# Patient Record
Sex: Female | Born: 1955 | Race: White | Hispanic: No | Smoking: Former smoker
Health system: Southern US, Community
[De-identification: ages and names within clinical notes are randomized; demographics above are authoritative.]

## PROBLEM LIST (undated history)

## (undated) DIAGNOSIS — Z87410 Personal history of cervical dysplasia: Secondary | ICD-10-CM

## (undated) DIAGNOSIS — K317 Polyp of stomach and duodenum: Secondary | ICD-10-CM

## (undated) DIAGNOSIS — K648 Other hemorrhoids: Secondary | ICD-10-CM

## (undated) DIAGNOSIS — R55 Syncope and collapse: Secondary | ICD-10-CM

## (undated) DIAGNOSIS — K047 Periapical abscess without sinus: Secondary | ICD-10-CM

## (undated) DIAGNOSIS — F32A Depression, unspecified: Secondary | ICD-10-CM

## (undated) DIAGNOSIS — N183 Chronic kidney disease, stage 3 unspecified: Secondary | ICD-10-CM

## (undated) DIAGNOSIS — R39198 Other difficulties with micturition: Secondary | ICD-10-CM

## (undated) DIAGNOSIS — F419 Anxiety disorder, unspecified: Secondary | ICD-10-CM

## (undated) DIAGNOSIS — K644 Residual hemorrhoidal skin tags: Secondary | ICD-10-CM

## (undated) DIAGNOSIS — M1990 Unspecified osteoarthritis, unspecified site: Secondary | ICD-10-CM

## (undated) DIAGNOSIS — E785 Hyperlipidemia, unspecified: Secondary | ICD-10-CM

## (undated) DIAGNOSIS — R531 Weakness: Secondary | ICD-10-CM

## (undated) DIAGNOSIS — K624 Stenosis of anus and rectum: Secondary | ICD-10-CM

## (undated) DIAGNOSIS — D219 Benign neoplasm of connective and other soft tissue, unspecified: Secondary | ICD-10-CM

## (undated) DIAGNOSIS — M654 Radial styloid tenosynovitis [de Quervain]: Secondary | ICD-10-CM

## (undated) DIAGNOSIS — R319 Hematuria, unspecified: Secondary | ICD-10-CM

## (undated) DIAGNOSIS — K50118 Crohn's disease of large intestine with other complication: Secondary | ICD-10-CM

## (undated) DIAGNOSIS — F329 Major depressive disorder, single episode, unspecified: Secondary | ICD-10-CM

## (undated) DIAGNOSIS — R5383 Other fatigue: Secondary | ICD-10-CM

## (undated) DIAGNOSIS — E876 Hypokalemia: Secondary | ICD-10-CM

## (undated) DIAGNOSIS — R233 Spontaneous ecchymoses: Secondary | ICD-10-CM

## (undated) DIAGNOSIS — K625 Hemorrhage of anus and rectum: Secondary | ICD-10-CM

## (undated) DIAGNOSIS — A6000 Herpesviral infection of urogenital system, unspecified: Secondary | ICD-10-CM

## (undated) DIAGNOSIS — K921 Melena: Secondary | ICD-10-CM

## (undated) DIAGNOSIS — Z5189 Encounter for other specified aftercare: Secondary | ICD-10-CM

## (undated) DIAGNOSIS — K219 Gastro-esophageal reflux disease without esophagitis: Secondary | ICD-10-CM

## (undated) DIAGNOSIS — K6289 Other specified diseases of anus and rectum: Secondary | ICD-10-CM

## (undated) DIAGNOSIS — Z9189 Other specified personal risk factors, not elsewhere classified: Secondary | ICD-10-CM

## (undated) DIAGNOSIS — T7840XA Allergy, unspecified, initial encounter: Secondary | ICD-10-CM

## (undated) DIAGNOSIS — G2581 Restless legs syndrome: Secondary | ICD-10-CM

## (undated) DIAGNOSIS — N83209 Unspecified ovarian cyst, unspecified side: Secondary | ICD-10-CM

## (undated) DIAGNOSIS — R238 Other skin changes: Secondary | ICD-10-CM

## (undated) DIAGNOSIS — R911 Solitary pulmonary nodule: Secondary | ICD-10-CM

## (undated) DIAGNOSIS — H269 Unspecified cataract: Secondary | ICD-10-CM

## (undated) DIAGNOSIS — Z8719 Personal history of other diseases of the digestive system: Secondary | ICD-10-CM

## (undated) DIAGNOSIS — D649 Anemia, unspecified: Secondary | ICD-10-CM

## (undated) DIAGNOSIS — Z972 Presence of dental prosthetic device (complete) (partial): Secondary | ICD-10-CM

## (undated) DIAGNOSIS — K529 Noninfective gastroenteritis and colitis, unspecified: Secondary | ICD-10-CM

## (undated) DIAGNOSIS — I739 Peripheral vascular disease, unspecified: Secondary | ICD-10-CM

## (undated) DIAGNOSIS — N289 Disorder of kidney and ureter, unspecified: Secondary | ICD-10-CM

## (undated) DIAGNOSIS — H539 Unspecified visual disturbance: Secondary | ICD-10-CM

## (undated) DIAGNOSIS — E669 Obesity, unspecified: Secondary | ICD-10-CM

## (undated) DIAGNOSIS — I10 Essential (primary) hypertension: Secondary | ICD-10-CM

## (undated) HISTORY — DX: Syncope and collapse: R55

## (undated) HISTORY — PX: RESTORATIVE PROCTOCOLECTOMY: SHX2335

## (undated) HISTORY — DX: Depression, unspecified: F32.A

## (undated) HISTORY — DX: Benign neoplasm of connective and other soft tissue, unspecified: D21.9

## (undated) HISTORY — DX: Weakness: R53.1

## (undated) HISTORY — DX: Spontaneous ecchymoses: R23.3

## (undated) HISTORY — DX: Radial styloid tenosynovitis (de quervain): M65.4

## (undated) HISTORY — PX: COLONOSCOPY: SHX174

## (undated) HISTORY — DX: Major depressive disorder, single episode, unspecified: F32.9

## (undated) HISTORY — PX: TUBAL LIGATION: SHX77

## (undated) HISTORY — DX: Anemia, unspecified: D64.9

## (undated) HISTORY — PX: SIGMOIDOSCOPY: SUR1295

## (undated) HISTORY — DX: Crohn's disease of large intestine with other complication: K50.118

## (undated) HISTORY — DX: Allergy, unspecified, initial encounter: T78.40XA

## (undated) HISTORY — PX: UPPER GASTROINTESTINAL ENDOSCOPY: SHX188

## (undated) HISTORY — DX: Encounter for other specified aftercare: Z51.89

## (undated) HISTORY — PX: CHOLECYSTECTOMY: SHX55

## (undated) HISTORY — DX: Other hemorrhoids: K64.8

## (undated) HISTORY — PX: HEMORRHOID SURGERY: SHX153

## (undated) HISTORY — DX: Gastro-esophageal reflux disease without esophagitis: K21.9

## (undated) HISTORY — PX: ANAL DILATION: SHX1137

## (undated) HISTORY — DX: Melena: K92.1

## (undated) HISTORY — DX: Essential (primary) hypertension: I10

## (undated) HISTORY — PX: CERVICAL BIOPSY  W/ LOOP ELECTRODE EXCISION: SUR135

## (undated) HISTORY — DX: Stenosis of anus and rectum: K62.4

## (undated) HISTORY — DX: Unspecified cataract: H26.9

## (undated) HISTORY — DX: Polyp of stomach and duodenum: K31.7

## (undated) HISTORY — DX: Other fatigue: R53.83

## (undated) HISTORY — DX: Solitary pulmonary nodule: R91.1

## (undated) HISTORY — DX: Presence of dental prosthetic device (complete) (partial): Z97.2

## (undated) HISTORY — DX: Unspecified visual disturbance: H53.9

## (undated) HISTORY — DX: Other difficulties with micturition: R39.198

## (undated) HISTORY — DX: Unspecified osteoarthritis, unspecified site: M19.90

## (undated) HISTORY — PX: OTHER SURGICAL HISTORY: SHX169

## (undated) HISTORY — DX: Noninfective gastroenteritis and colitis, unspecified: K52.9

## (undated) HISTORY — DX: Anxiety disorder, unspecified: F41.9

## (undated) HISTORY — DX: Personal history of other diseases of the digestive system: Z87.19

## (undated) HISTORY — DX: Hypokalemia: E87.6

## (undated) HISTORY — DX: Residual hemorrhoidal skin tags: K64.4

## (undated) HISTORY — DX: Chronic kidney disease, stage 3 unspecified: N18.30

## (undated) HISTORY — DX: Other specified personal risk factors, not elsewhere classified: Z91.89

## (undated) HISTORY — DX: Personal history of cervical dysplasia: Z87.410

## (undated) HISTORY — DX: Unspecified ovarian cyst, unspecified side: N83.209

## (undated) HISTORY — DX: Hematuria, unspecified: R31.9

## (undated) HISTORY — PX: ILEOSTOMY CLOSURE: SHX1784

## (undated) HISTORY — DX: Peripheral vascular disease, unspecified: I73.9

## (undated) HISTORY — DX: Disorder of kidney and ureter, unspecified: N28.9

## (undated) HISTORY — DX: Hemorrhage of anus and rectum: K62.5

## (undated) HISTORY — DX: Obesity, unspecified: E66.9

## (undated) HISTORY — DX: Other specified diseases of anus and rectum: K62.89

## (undated) HISTORY — DX: Hyperlipidemia, unspecified: E78.5

## (undated) HISTORY — DX: Other skin changes: R23.8

## (undated) HISTORY — DX: Restless legs syndrome: G25.81

---

## 1996-09-04 ENCOUNTER — Encounter: Payer: Self-pay | Admitting: Internal Medicine

## 1996-09-06 HISTORY — PX: TOTAL ABDOMINAL HYSTERECTOMY: SHX209

## 1999-03-25 ENCOUNTER — Other Ambulatory Visit: Admission: RE | Admit: 1999-03-25 | Discharge: 1999-03-25 | Payer: Self-pay | Admitting: Obstetrics and Gynecology

## 2000-04-05 ENCOUNTER — Other Ambulatory Visit: Admission: RE | Admit: 2000-04-05 | Discharge: 2000-04-05 | Payer: Self-pay | Admitting: Obstetrics and Gynecology

## 2001-06-16 ENCOUNTER — Other Ambulatory Visit: Admission: RE | Admit: 2001-06-16 | Discharge: 2001-06-16 | Payer: Self-pay | Admitting: Obstetrics and Gynecology

## 2001-10-27 ENCOUNTER — Ambulatory Visit (HOSPITAL_BASED_OUTPATIENT_CLINIC_OR_DEPARTMENT_OTHER): Admission: RE | Admit: 2001-10-27 | Discharge: 2001-10-27 | Payer: Self-pay | Admitting: General Surgery

## 2001-10-27 ENCOUNTER — Encounter (INDEPENDENT_AMBULATORY_CARE_PROVIDER_SITE_OTHER): Payer: Self-pay | Admitting: Specialist

## 2001-11-15 ENCOUNTER — Encounter (HOSPITAL_COMMUNITY): Admission: RE | Admit: 2001-11-15 | Discharge: 2002-02-13 | Payer: Self-pay | Admitting: Internal Medicine

## 2001-11-15 ENCOUNTER — Encounter (INDEPENDENT_AMBULATORY_CARE_PROVIDER_SITE_OTHER): Payer: Self-pay | Admitting: Specialist

## 2001-11-15 ENCOUNTER — Encounter: Payer: Self-pay | Admitting: Internal Medicine

## 2001-11-16 DIAGNOSIS — K2100 Gastro-esophageal reflux disease with esophagitis, without bleeding: Secondary | ICD-10-CM | POA: Insufficient documentation

## 2001-11-16 DIAGNOSIS — D131 Benign neoplasm of stomach: Secondary | ICD-10-CM | POA: Insufficient documentation

## 2001-11-16 DIAGNOSIS — K209 Esophagitis, unspecified: Secondary | ICD-10-CM

## 2002-10-05 ENCOUNTER — Inpatient Hospital Stay (HOSPITAL_COMMUNITY): Admission: EM | Admit: 2002-10-05 | Discharge: 2002-10-06 | Payer: Self-pay

## 2002-10-12 ENCOUNTER — Other Ambulatory Visit: Admission: RE | Admit: 2002-10-12 | Discharge: 2002-10-12 | Payer: Self-pay | Admitting: Obstetrics and Gynecology

## 2002-11-16 ENCOUNTER — Encounter: Payer: Self-pay | Admitting: Internal Medicine

## 2003-01-18 ENCOUNTER — Encounter: Payer: Self-pay | Admitting: Internal Medicine

## 2003-01-18 ENCOUNTER — Ambulatory Visit (HOSPITAL_COMMUNITY): Admission: RE | Admit: 2003-01-18 | Discharge: 2003-01-18 | Payer: Self-pay | Admitting: Internal Medicine

## 2003-01-23 ENCOUNTER — Ambulatory Visit (HOSPITAL_COMMUNITY): Admission: RE | Admit: 2003-01-23 | Discharge: 2003-01-23 | Payer: Self-pay | Admitting: Internal Medicine

## 2003-01-23 ENCOUNTER — Encounter: Payer: Self-pay | Admitting: Internal Medicine

## 2003-04-05 ENCOUNTER — Encounter: Admission: RE | Admit: 2003-04-05 | Discharge: 2003-04-05 | Payer: Self-pay | Admitting: Family Medicine

## 2003-04-05 ENCOUNTER — Encounter: Payer: Self-pay | Admitting: Family Medicine

## 2003-04-14 ENCOUNTER — Encounter: Admission: RE | Admit: 2003-04-14 | Discharge: 2003-04-14 | Payer: Self-pay | Admitting: Family Medicine

## 2003-04-14 ENCOUNTER — Encounter: Payer: Self-pay | Admitting: Family Medicine

## 2004-04-01 ENCOUNTER — Encounter (HOSPITAL_COMMUNITY): Admission: RE | Admit: 2004-04-01 | Discharge: 2004-06-05 | Payer: Self-pay | Admitting: Internal Medicine

## 2004-07-24 ENCOUNTER — Other Ambulatory Visit: Admission: RE | Admit: 2004-07-24 | Discharge: 2004-07-24 | Payer: Self-pay | Admitting: Family Medicine

## 2004-07-28 ENCOUNTER — Ambulatory Visit: Payer: Self-pay | Admitting: Internal Medicine

## 2004-07-28 DIAGNOSIS — K5190 Ulcerative colitis, unspecified, without complications: Secondary | ICD-10-CM | POA: Insufficient documentation

## 2004-07-29 ENCOUNTER — Encounter (HOSPITAL_COMMUNITY): Admission: RE | Admit: 2004-07-29 | Discharge: 2004-09-05 | Payer: Self-pay | Admitting: Internal Medicine

## 2004-09-29 ENCOUNTER — Encounter (INDEPENDENT_AMBULATORY_CARE_PROVIDER_SITE_OTHER): Payer: Self-pay | Admitting: *Deleted

## 2004-09-29 ENCOUNTER — Ambulatory Visit (HOSPITAL_COMMUNITY): Admission: RE | Admit: 2004-09-29 | Discharge: 2004-09-29 | Payer: Self-pay | Admitting: General Surgery

## 2004-09-29 ENCOUNTER — Ambulatory Visit (HOSPITAL_BASED_OUTPATIENT_CLINIC_OR_DEPARTMENT_OTHER): Admission: RE | Admit: 2004-09-29 | Discharge: 2004-09-29 | Payer: Self-pay | Admitting: General Surgery

## 2004-12-29 ENCOUNTER — Emergency Department (HOSPITAL_COMMUNITY): Admission: EM | Admit: 2004-12-29 | Discharge: 2004-12-29 | Payer: Self-pay | Admitting: Emergency Medicine

## 2005-01-12 ENCOUNTER — Ambulatory Visit: Payer: Self-pay | Admitting: Internal Medicine

## 2005-04-05 ENCOUNTER — Emergency Department (HOSPITAL_COMMUNITY): Admission: EM | Admit: 2005-04-05 | Discharge: 2005-04-05 | Payer: Self-pay | Admitting: Emergency Medicine

## 2005-04-07 ENCOUNTER — Ambulatory Visit: Payer: Self-pay | Admitting: Internal Medicine

## 2005-10-15 ENCOUNTER — Ambulatory Visit (HOSPITAL_COMMUNITY): Admission: RE | Admit: 2005-10-15 | Discharge: 2005-10-15 | Payer: Self-pay | Admitting: Internal Medicine

## 2006-03-17 ENCOUNTER — Ambulatory Visit: Payer: Self-pay | Admitting: Family Medicine

## 2006-03-18 ENCOUNTER — Ambulatory Visit: Payer: Self-pay | Admitting: Family Medicine

## 2006-05-16 ENCOUNTER — Ambulatory Visit: Payer: Self-pay | Admitting: Internal Medicine

## 2006-06-22 ENCOUNTER — Ambulatory Visit: Payer: Self-pay | Admitting: Family Medicine

## 2006-07-30 IMAGING — CT CT PELVIS W/O CM
2 of 4 series · 17 of 46 positions shown, 19 images · IV contrast ([ID]/WATER)
Comparison: None

ABDOMEN CT WITHOUT CONTRAST:

CLINICAL DATA: Abdominal pain. Prior colectomy for ulcerative colitis.
TECHNIQUE: Multidetector CT imaging of the abdomen and pelvis was performed
following the standard protocol without oral or intravenous contrast.

[Series 2: a&p w/o · axial · non-contrast · 0.70mm/px · z∈[-440,-25]mm · 14 of 91 slices shown, 16 images]
[im 4/91  soft-tissue]
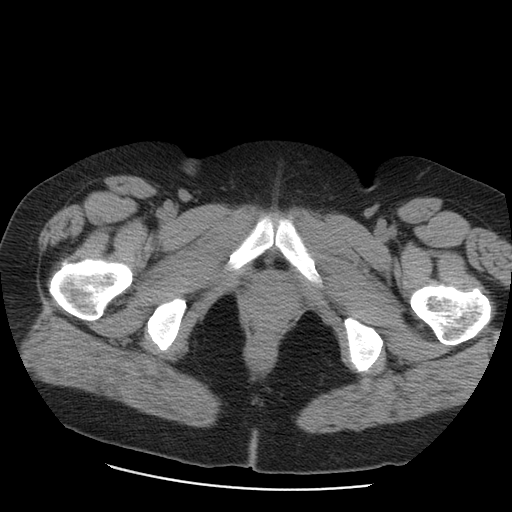
[im 4/91  bone]
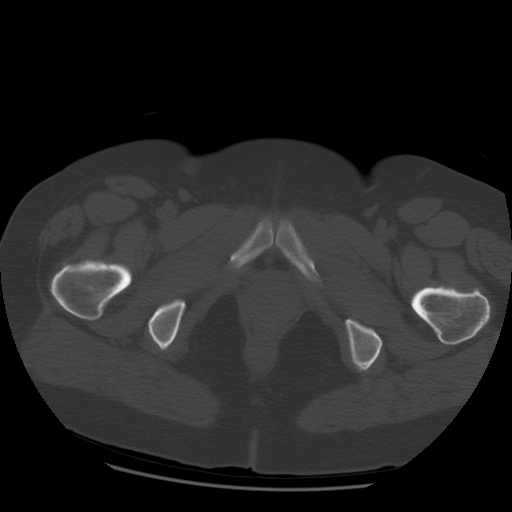
[im 11/91  soft-tissue]
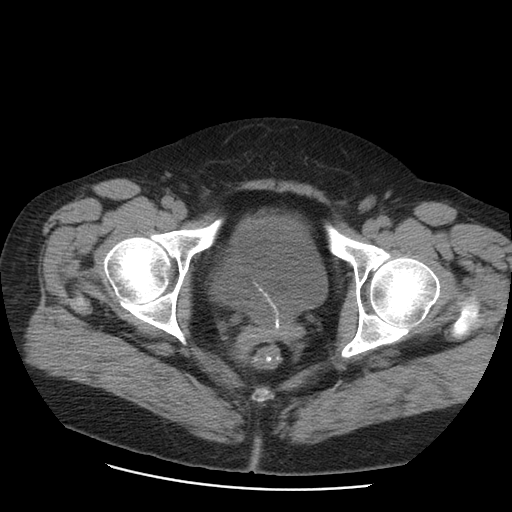
[im 19/91  soft-tissue]
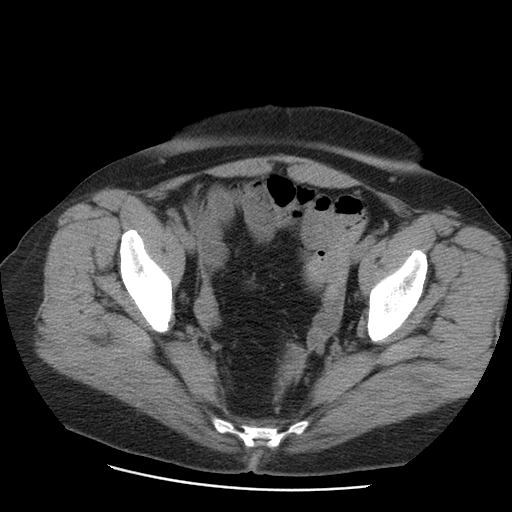
[im 26/91  soft-tissue]
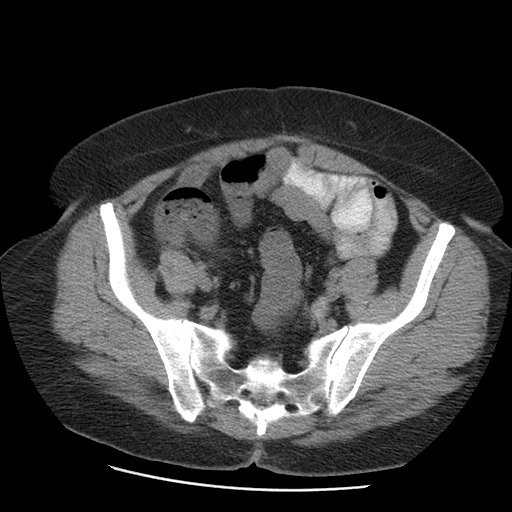
[im 29/91  soft-tissue]
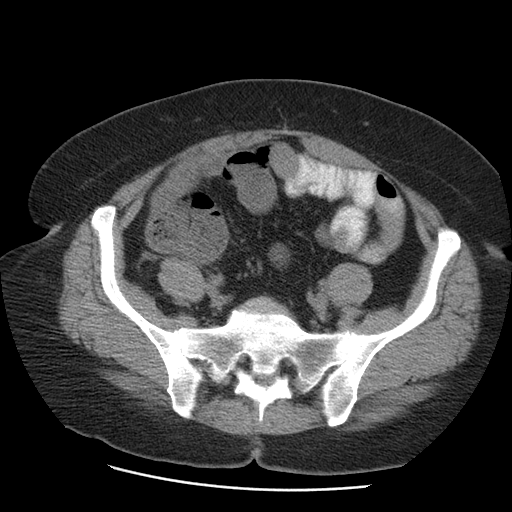
[im 37/91  soft-tissue]
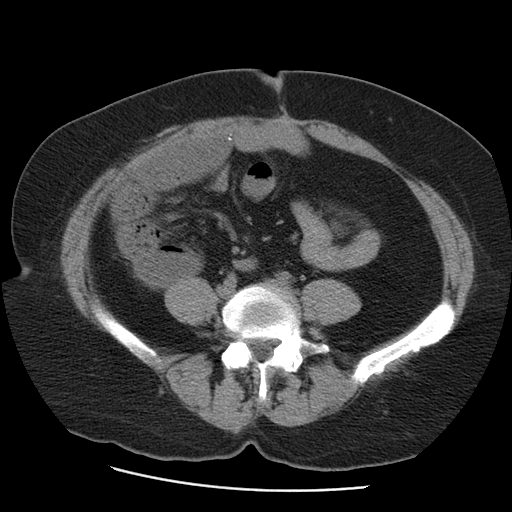
[im 44/91  soft-tissue]
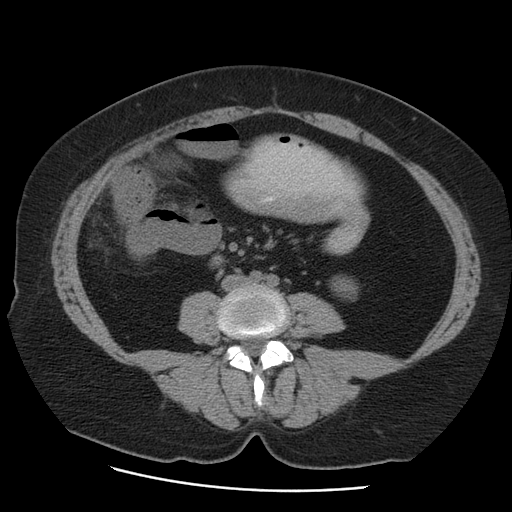
[im 47/91  soft-tissue]
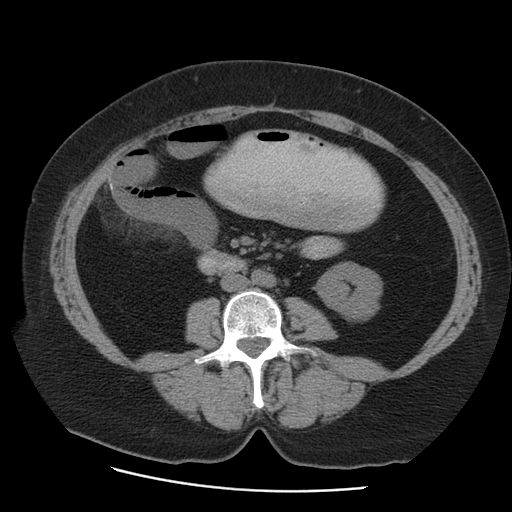
[im 55/91  soft-tissue]
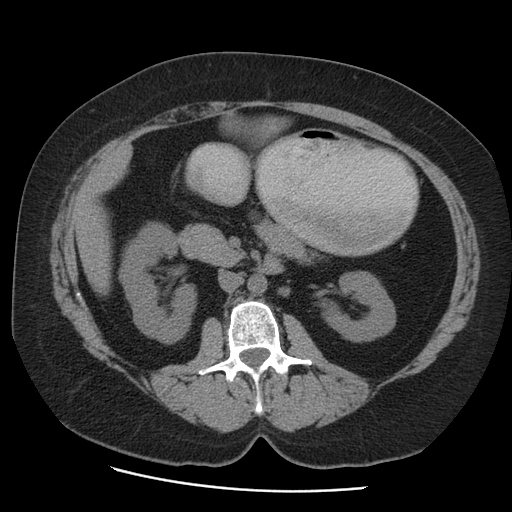
[im 55/91  bone]
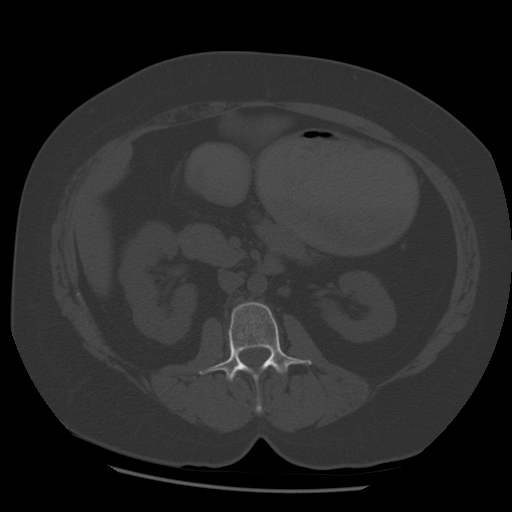
[im 62/91  soft-tissue]
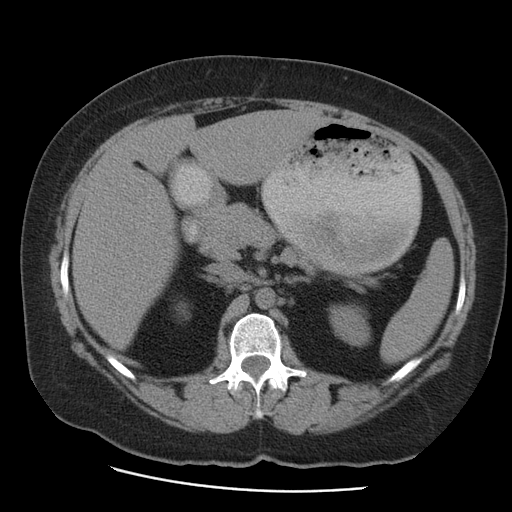
[im 69/91  soft-tissue]
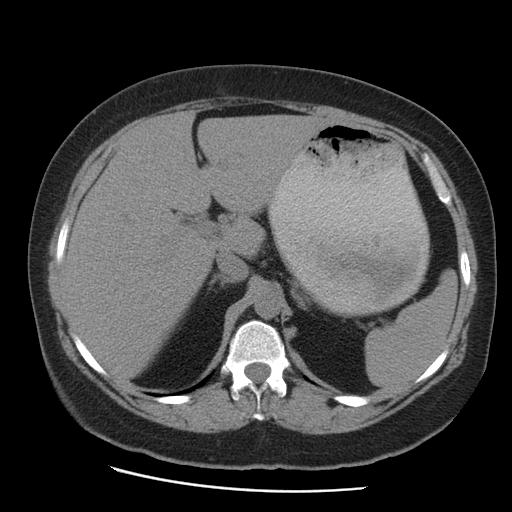
[im 73/91  soft-tissue]
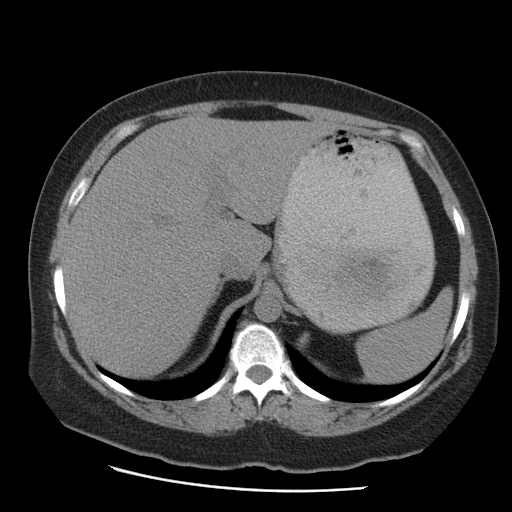
[im 80/91  soft-tissue]
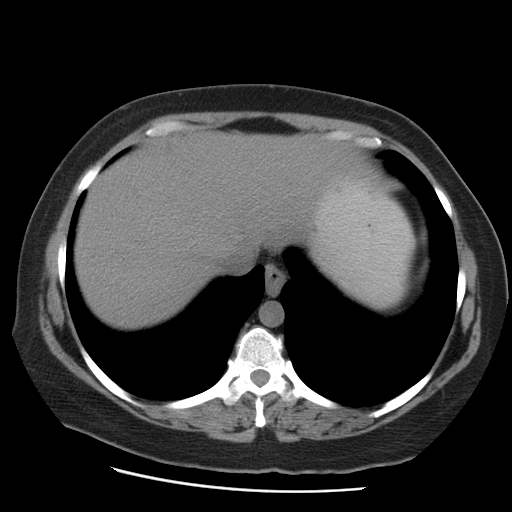
[im 87/91  soft-tissue]
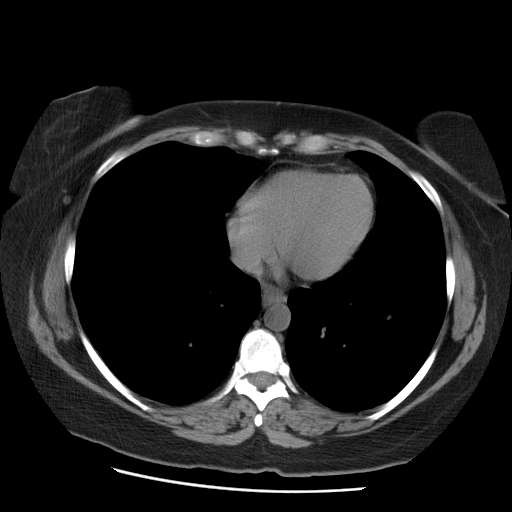

[Series 401: coronal abd · coronal · 0.96mm/px · 3 of 104 slices shown]
[im 35/104  soft-tissue]
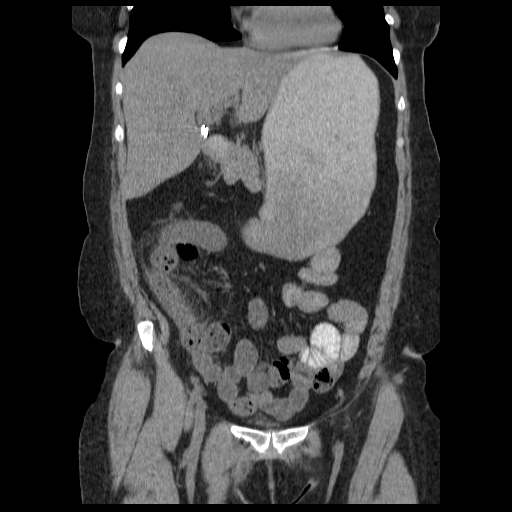
[im 46/104  soft-tissue]
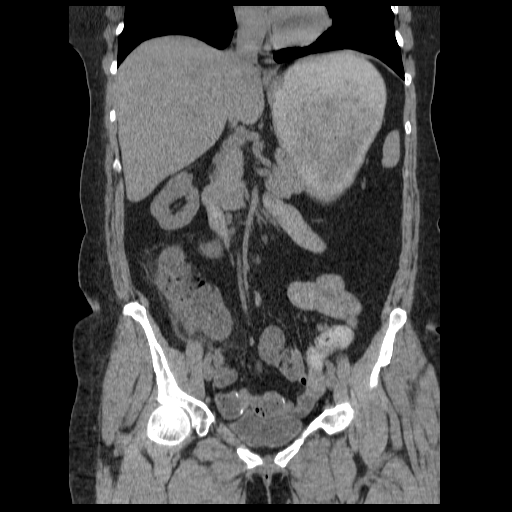
[im 58/104  soft-tissue]
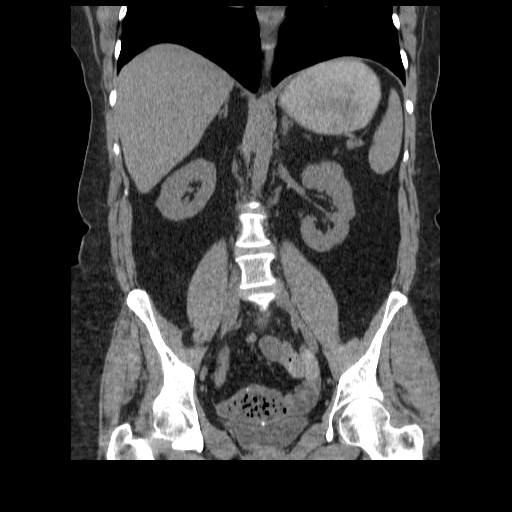

[17 of 46 positions shown; findings below may reference images not displayed]

FINDINGS: Patient status post colectomy. There are dilated small bowel loops in
the right abdomen, surrounded by mesenteric edema/inflammation. Findings are
compatible with small bowel obstruction. Stomach is dilated as well.

Patient status post with cystectomy. Small low density lesion within the left
lobe of the liver which cannot be fully characterized without IV contrast, but
appears to represent a small cyst. Spleen, pancreas, adrenals, kidneys
unremarkable. Small mesenteric and retroperitoneal lymph nodes are seen , none
of which are pathologically enlarged.
IMPRESSION: Prior colectomy. There are dilated small bowel loops in the right abdomen with
air-fluid levels compatible with small bowel obstruction. Stomach is mildly
dilated as well. There is mesenteric edema or inflammation adjacent to these
dilated small bowel loops.

PELVIS CT WITHOUT CONTRAST:
FINDINGS: Small bowel dilatation continues into the pelvis. No free fluid, free
air, or adenopathy. Patient status post hysterectomy.
IMPRESSION: Findings compatible with small bowel obstruction as described above.

## 2006-07-31 ENCOUNTER — Inpatient Hospital Stay (HOSPITAL_COMMUNITY): Admission: EM | Admit: 2006-07-31 | Discharge: 2006-08-03 | Payer: Self-pay | Admitting: Emergency Medicine

## 2006-08-02 IMAGING — CR DG ABDOMEN 2V
2 series · 2 of 2 positions shown · non-contrast
Comparison: none

CLINICAL DATA: Abdominal discomfort.  Follow-up small bowel obstruction.
 ABDOMEN - 2 VIEW:
 NG tube has been advanced since yesterday?s exam.  Tip is in the gastric fundus.  Decrease in small bowel distention since yesterday?s exam.  A couple of short air-fluid levels in the right abdomen.  Paucity of colonic gas.

[w abdomen upright]
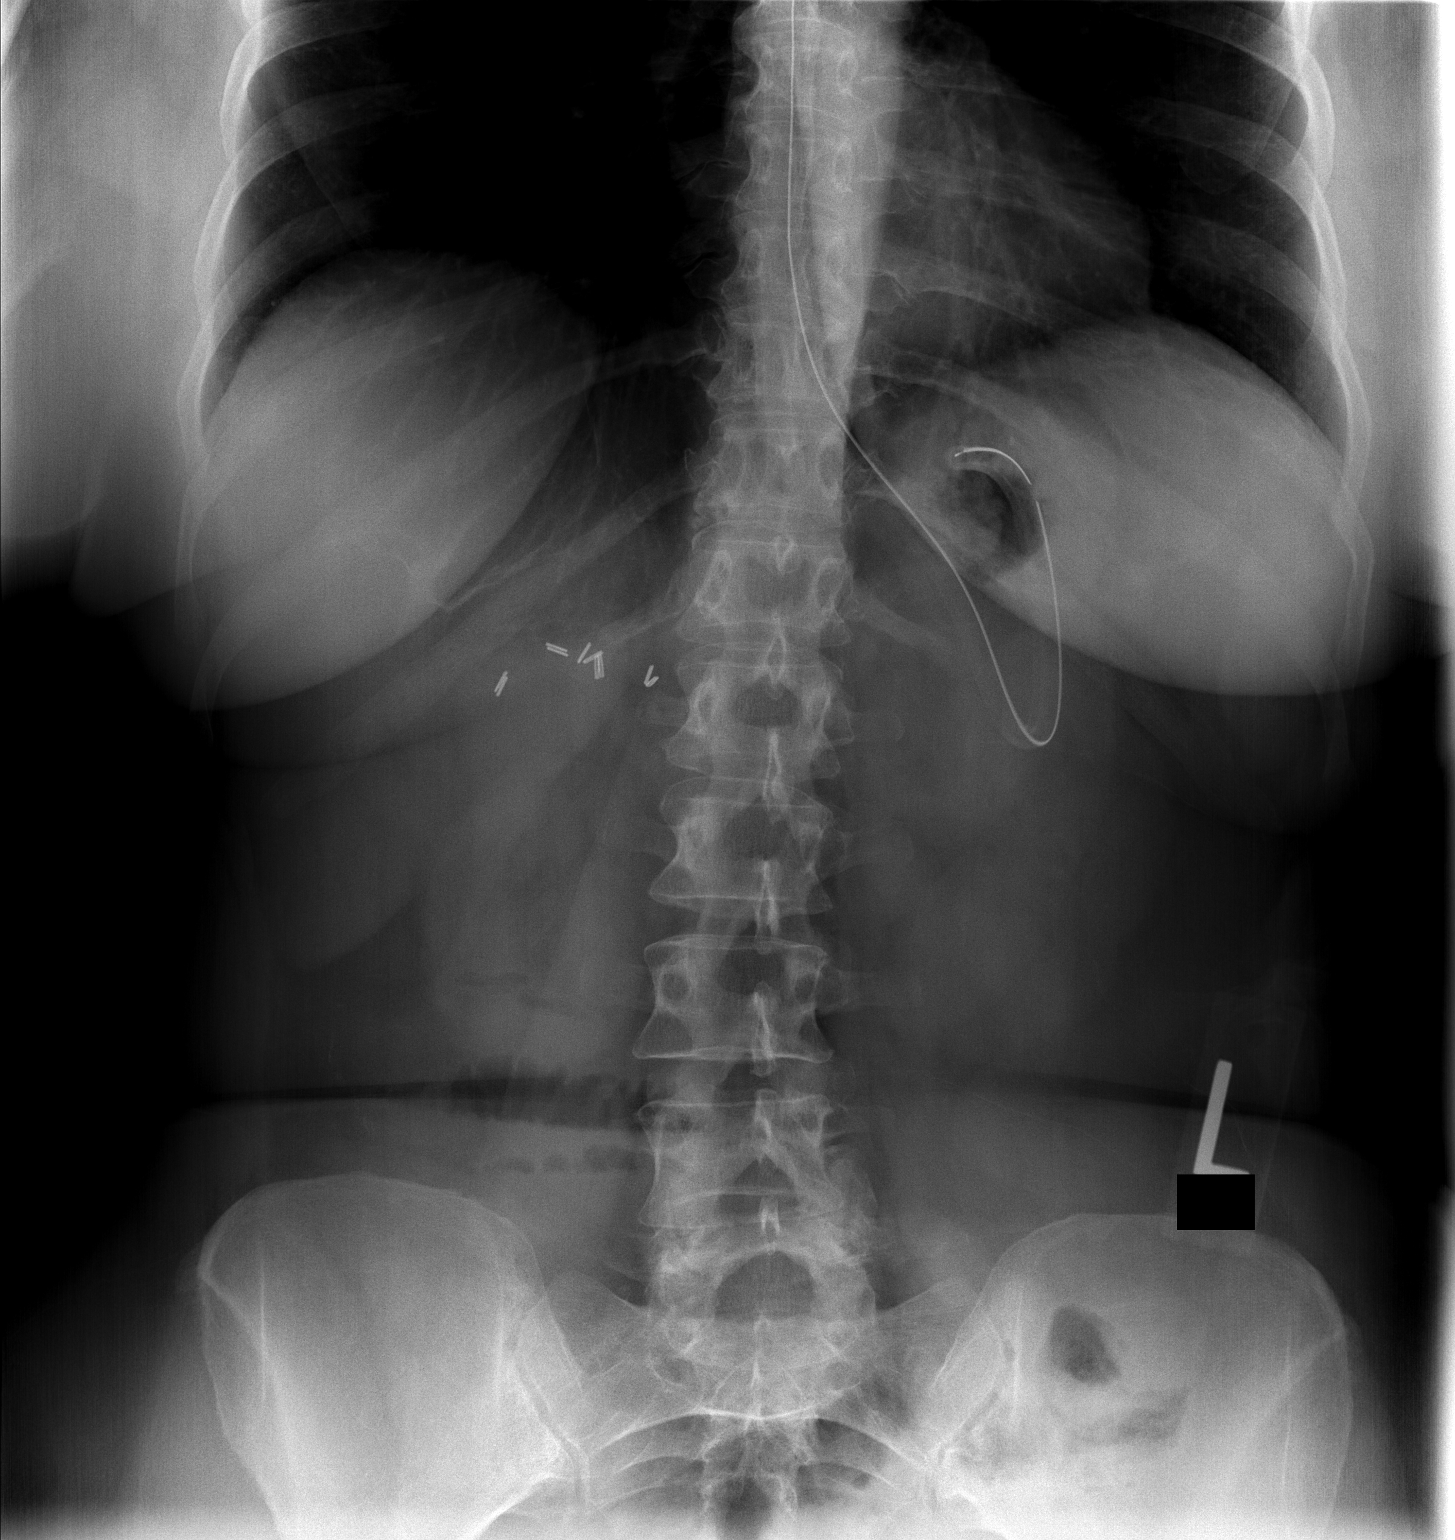

[t abdomen supine]
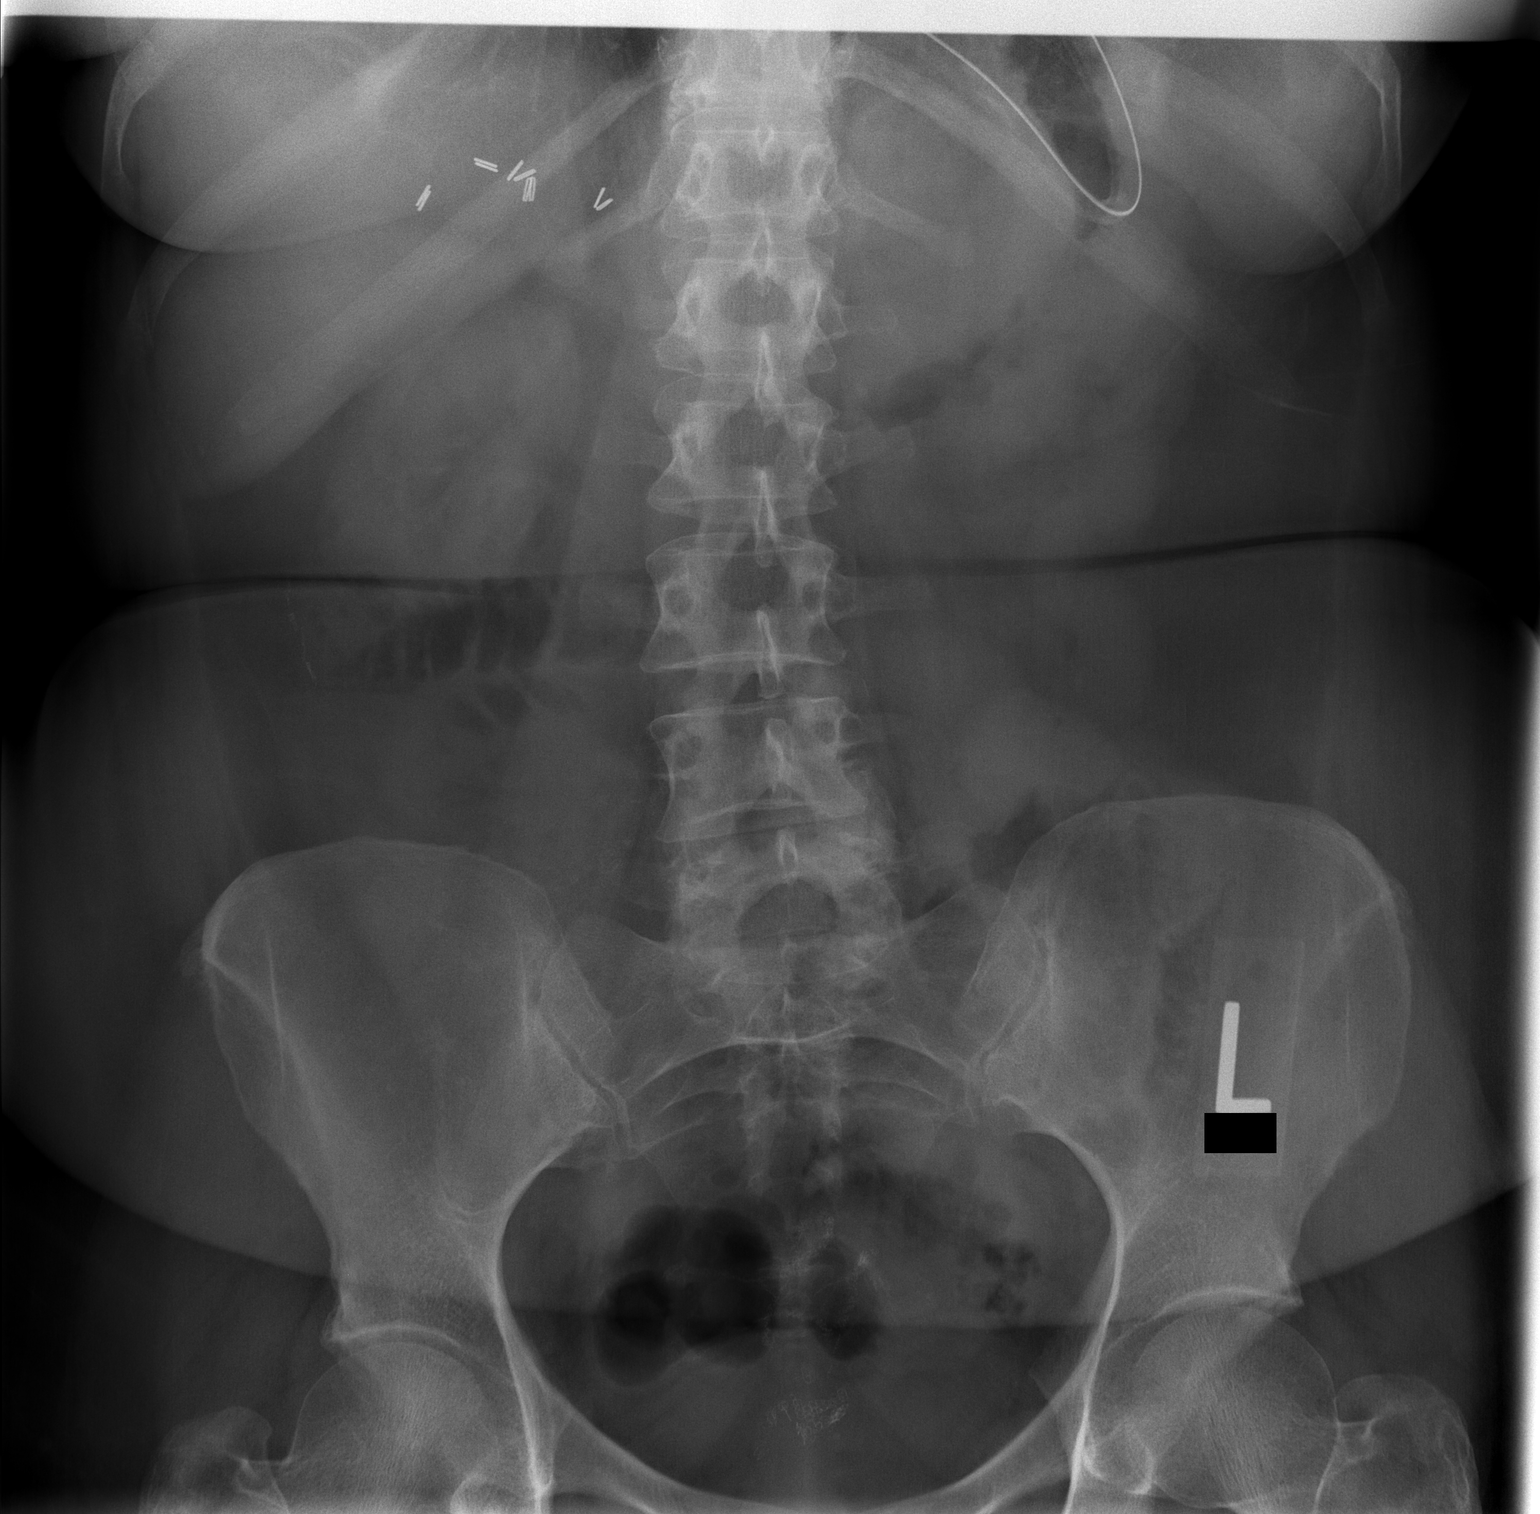

[2 of 2 positions shown; findings below may reference images not displayed]

IMPRESSION: Some improvement in small bowel obstruction.

## 2006-08-03 ENCOUNTER — Encounter (INDEPENDENT_AMBULATORY_CARE_PROVIDER_SITE_OTHER): Payer: Self-pay | Admitting: *Deleted

## 2006-08-03 IMAGING — CR DG ABDOMEN 2V
2 series · 2 of 2 positions shown · non-contrast
Comparison: [DATE].

CLINICAL DATA: Small bowel obstruction with left sided pain.  Previous colectomy.  
 ABDOMEN ? 2 VIEW:

[view not recorded (1 of 2)]
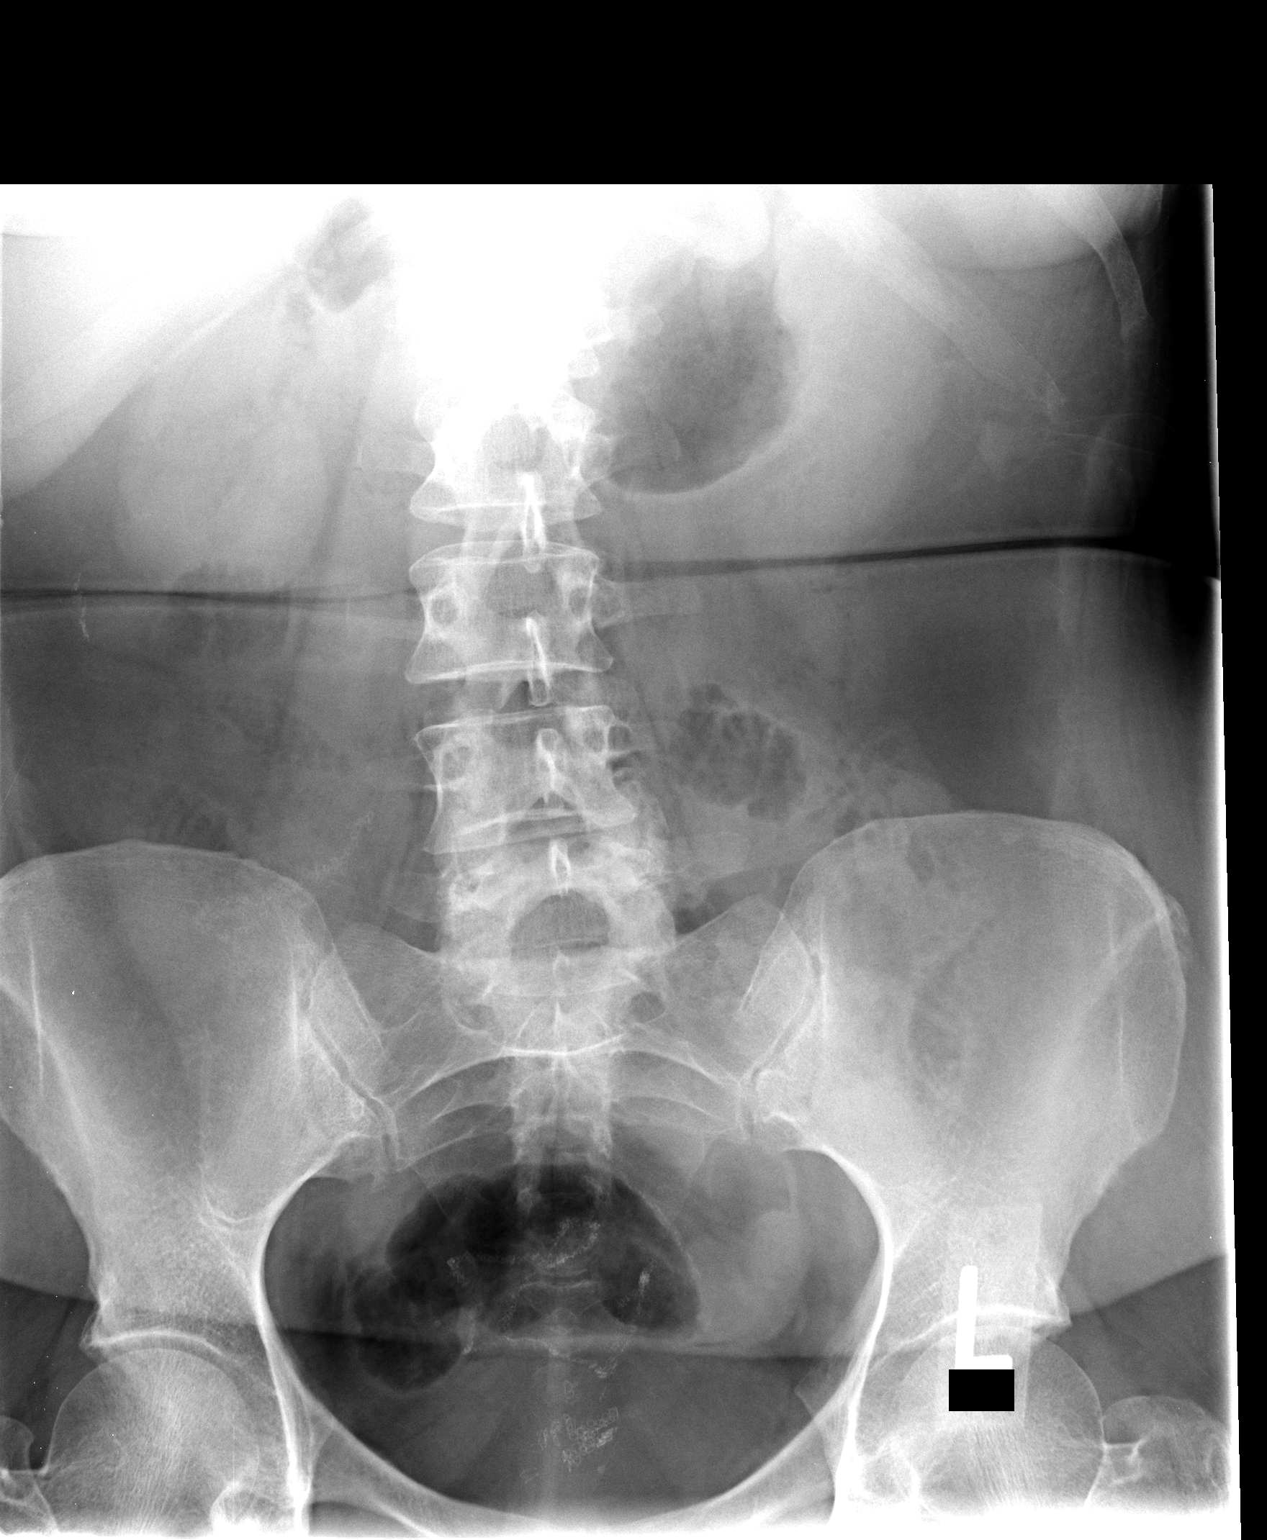

[view not recorded (2 of 2)]
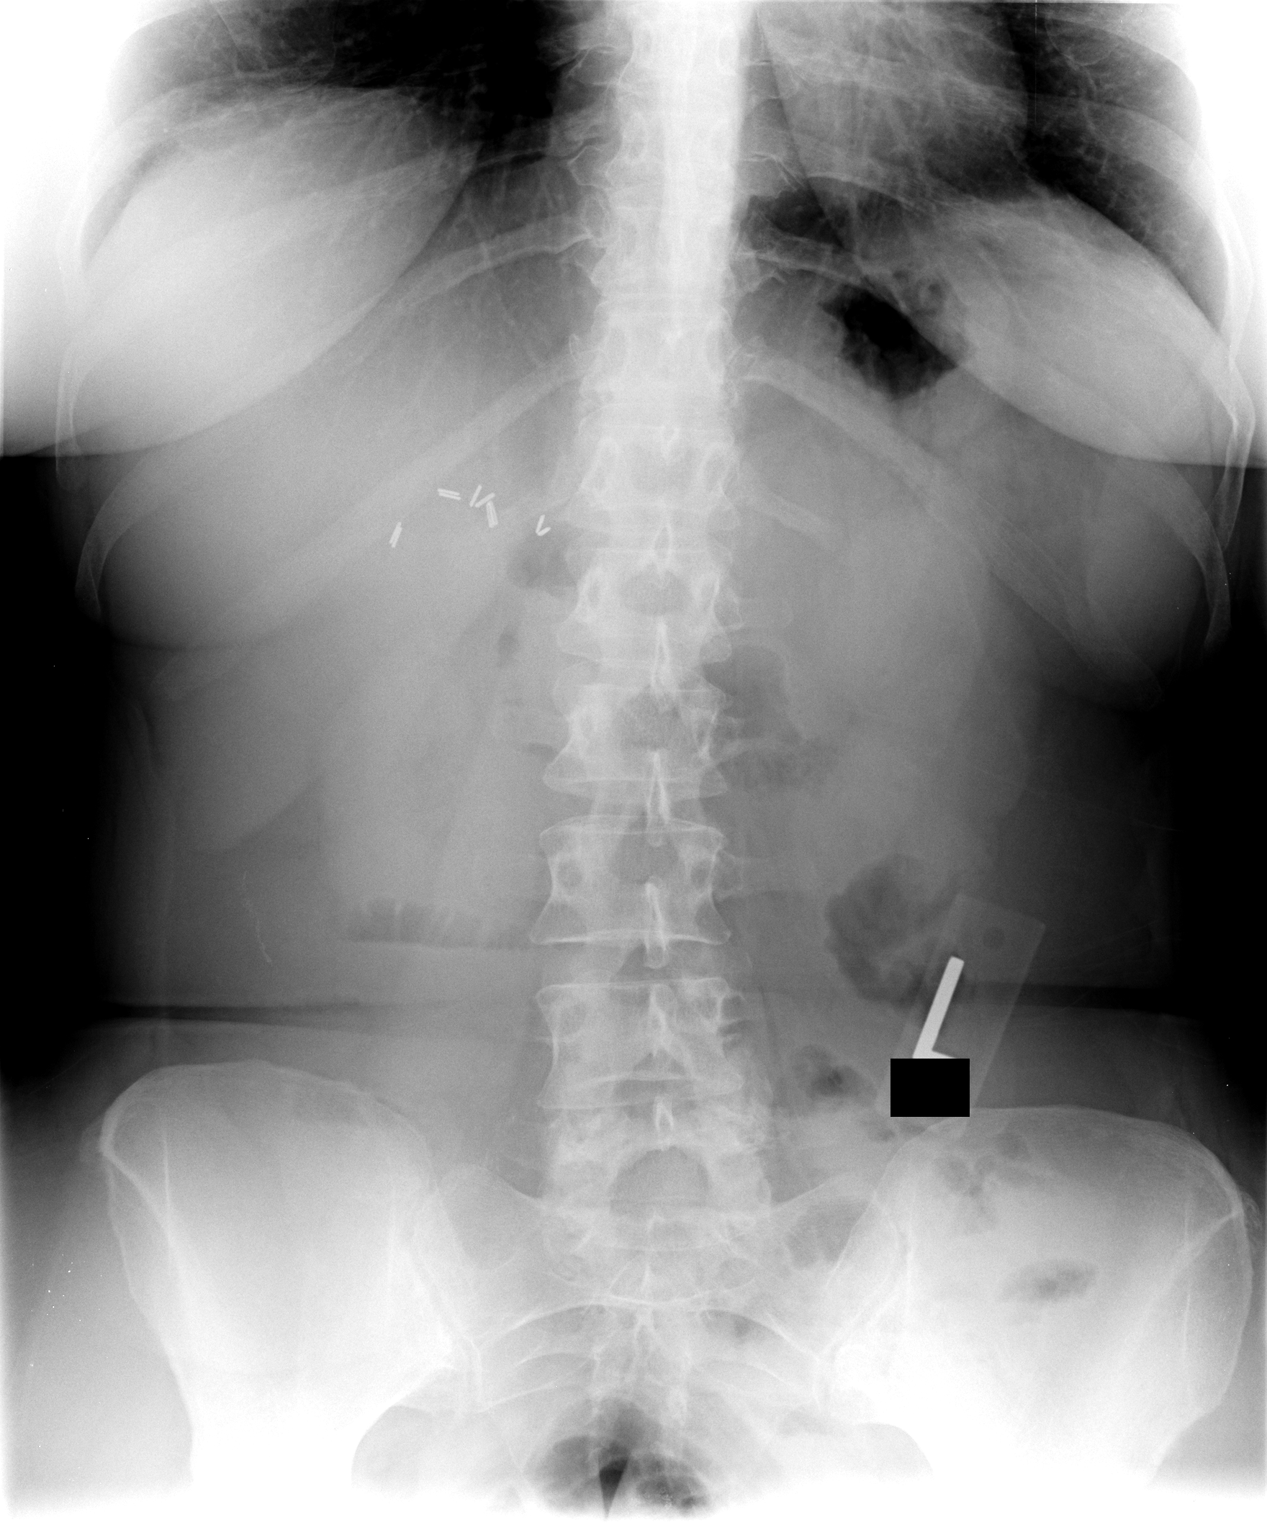

[2 of 2 positions shown; findings below may reference images not displayed]

FINDINGS: There is gas in nondilated small bowel with scattered air fluid levels.  Gas is seen in the sigmoid colon.
IMPRESSION: Improving small bowel obstruction.

## 2006-09-19 ENCOUNTER — Ambulatory Visit: Payer: Self-pay | Admitting: Family Medicine

## 2006-09-19 LAB — CONVERTED CEMR LAB
ALT: 13 units/L (ref 0–40)
AST: 15 units/L (ref 0–37)
BUN: 25 mg/dL — ABNORMAL HIGH (ref 6–23)
CO2: 26 meq/L (ref 19–32)
Calcium: 9.3 mg/dL (ref 8.4–10.5)
Chloride: 107 meq/L (ref 96–112)
Chol/HDL Ratio, serum: 5
Cholesterol: 203 mg/dL (ref 0–200)
Creatinine, Ser: 1.7 mg/dL — ABNORMAL HIGH (ref 0.4–1.2)
GFR calc non Af Amer: 34 mL/min
Glomerular Filtration Rate, Af Am: 41 mL/min/{1.73_m2}
Glucose, Bld: 92 mg/dL (ref 70–99)
HDL: 41 mg/dL (ref >39.0)
LDL DIRECT: 127 mg/dL
Potassium: 3.8 meq/L (ref 3.5–5.1)
Sodium: 140 meq/L (ref 135–145)
Triglyceride fasting, serum: 161 mg/dL — ABNORMAL HIGH (ref 0–149)
VLDL: 32 mg/dL (ref 0–40)

## 2006-10-18 ENCOUNTER — Emergency Department (HOSPITAL_COMMUNITY): Admission: EM | Admit: 2006-10-18 | Discharge: 2006-10-19 | Payer: Self-pay | Admitting: Emergency Medicine

## 2006-10-19 ENCOUNTER — Encounter (INDEPENDENT_AMBULATORY_CARE_PROVIDER_SITE_OTHER): Payer: Self-pay | Admitting: *Deleted

## 2006-10-19 IMAGING — CR DG ABDOMEN ACUTE W/ 1V CHEST
3 series · 3 of 3 positions shown · non-contrast
Comparison: [DATE].

CLINICAL DATA: Constipation.  Abdominal pain.  
 ACUTE ABDOMINAL SERIES:

[t abdomen supine]
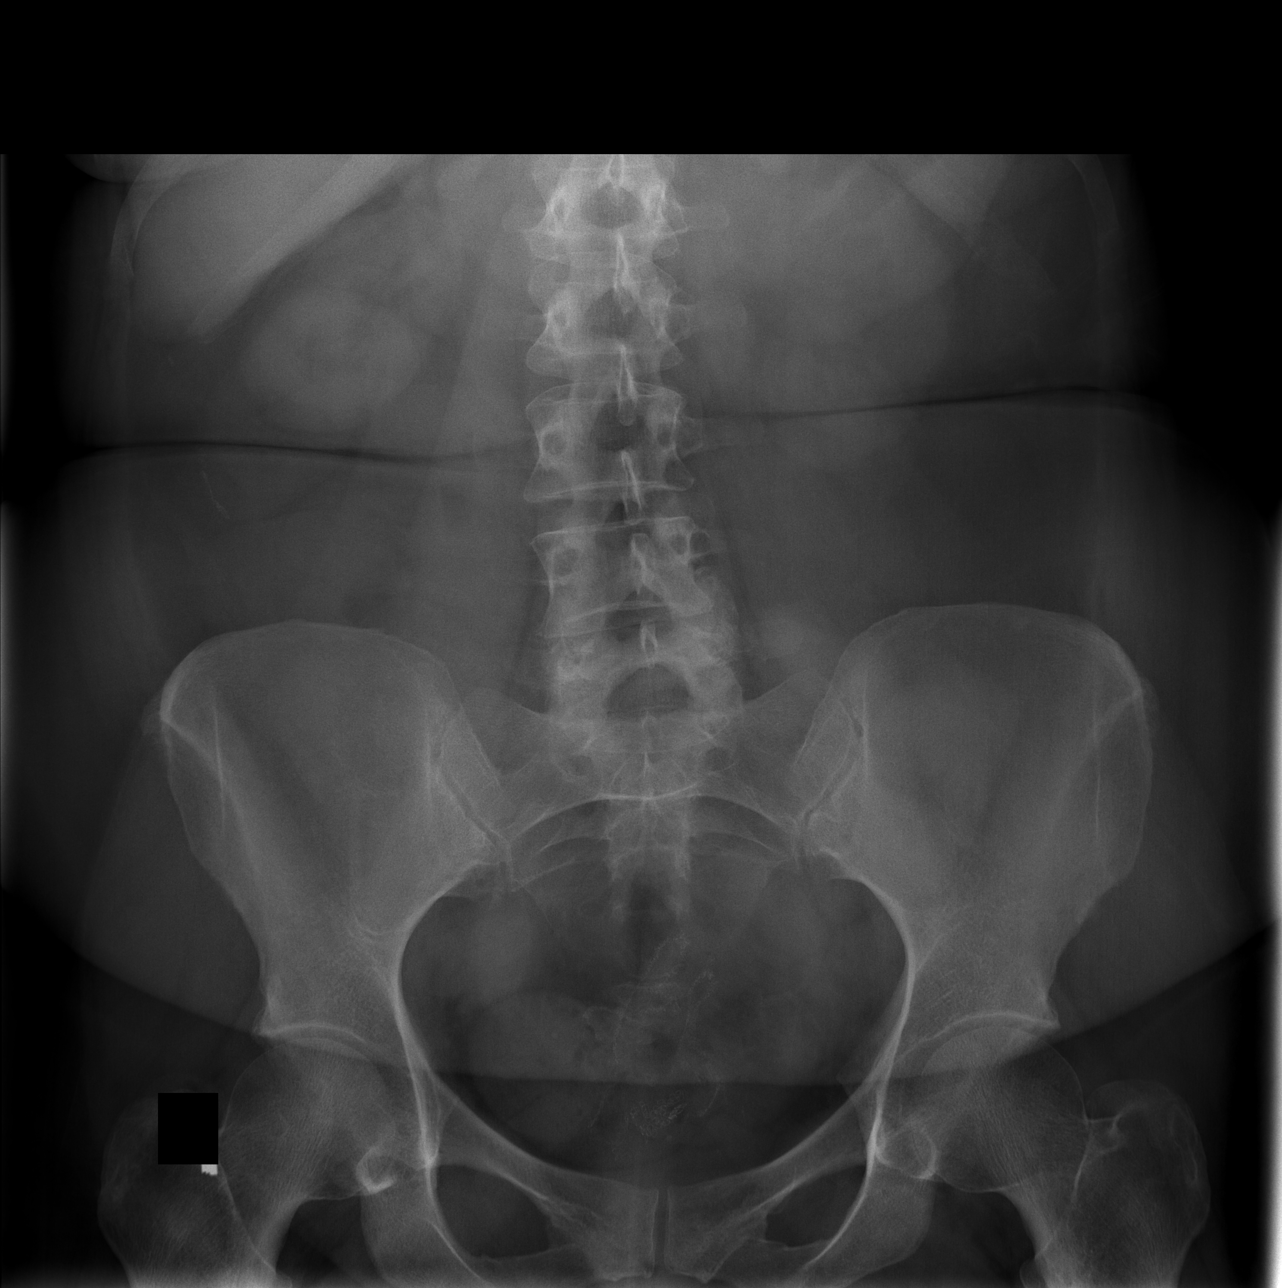

[w chest pa]
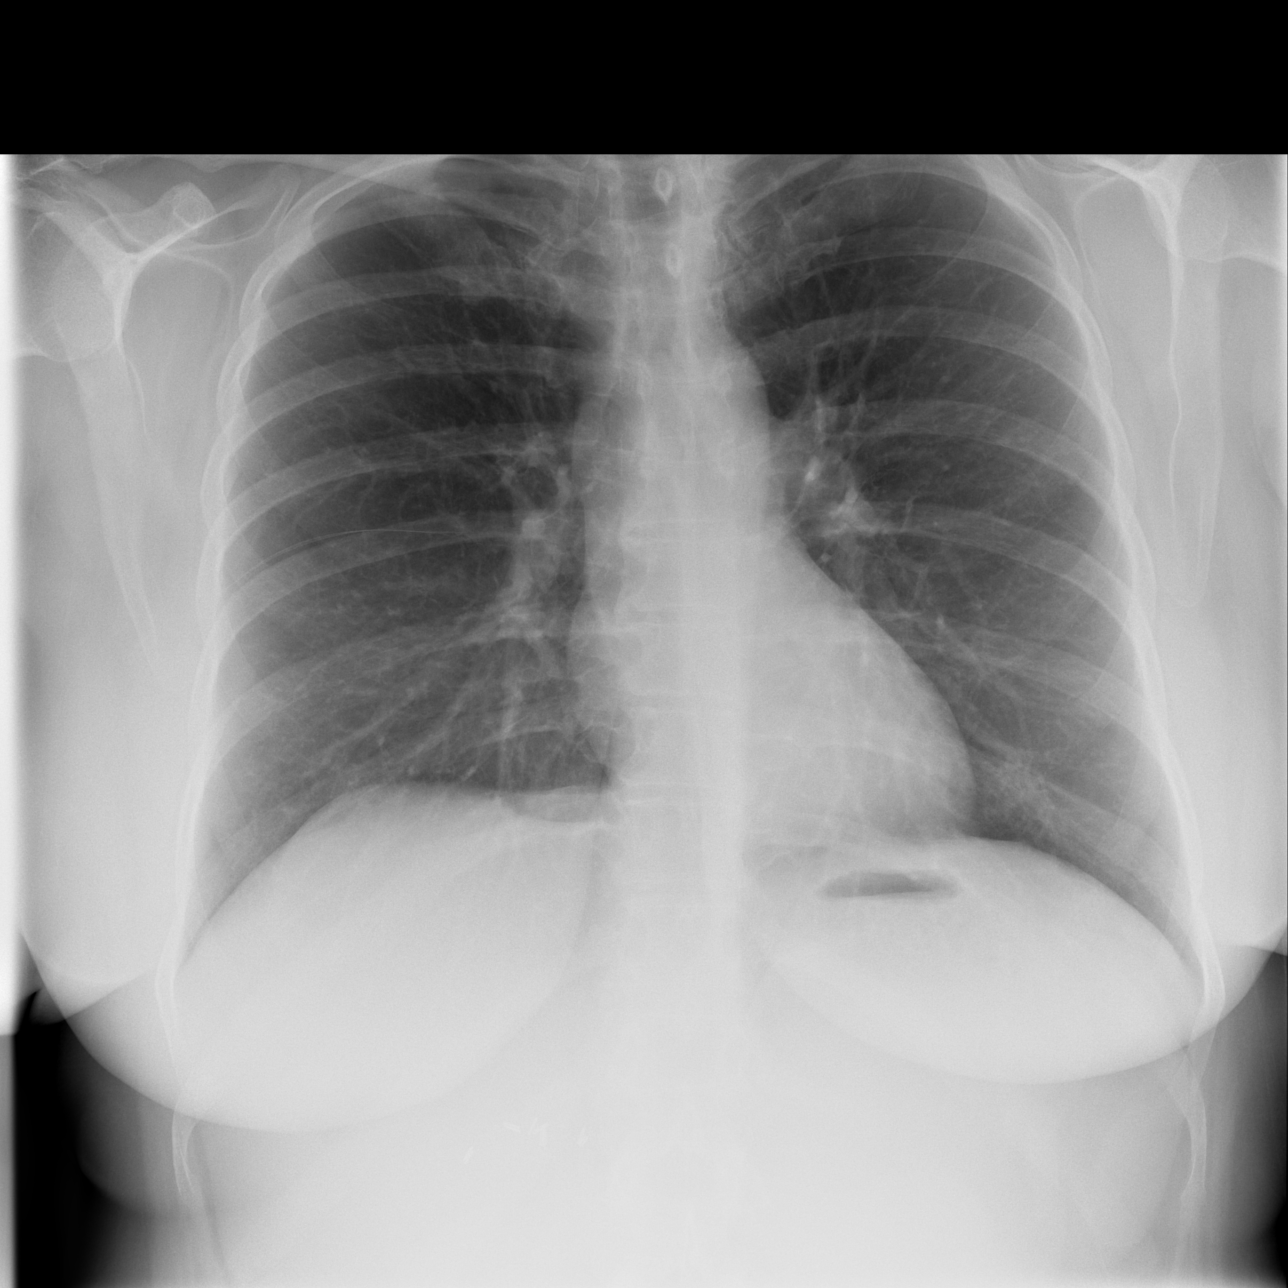

[w abdomen upright]
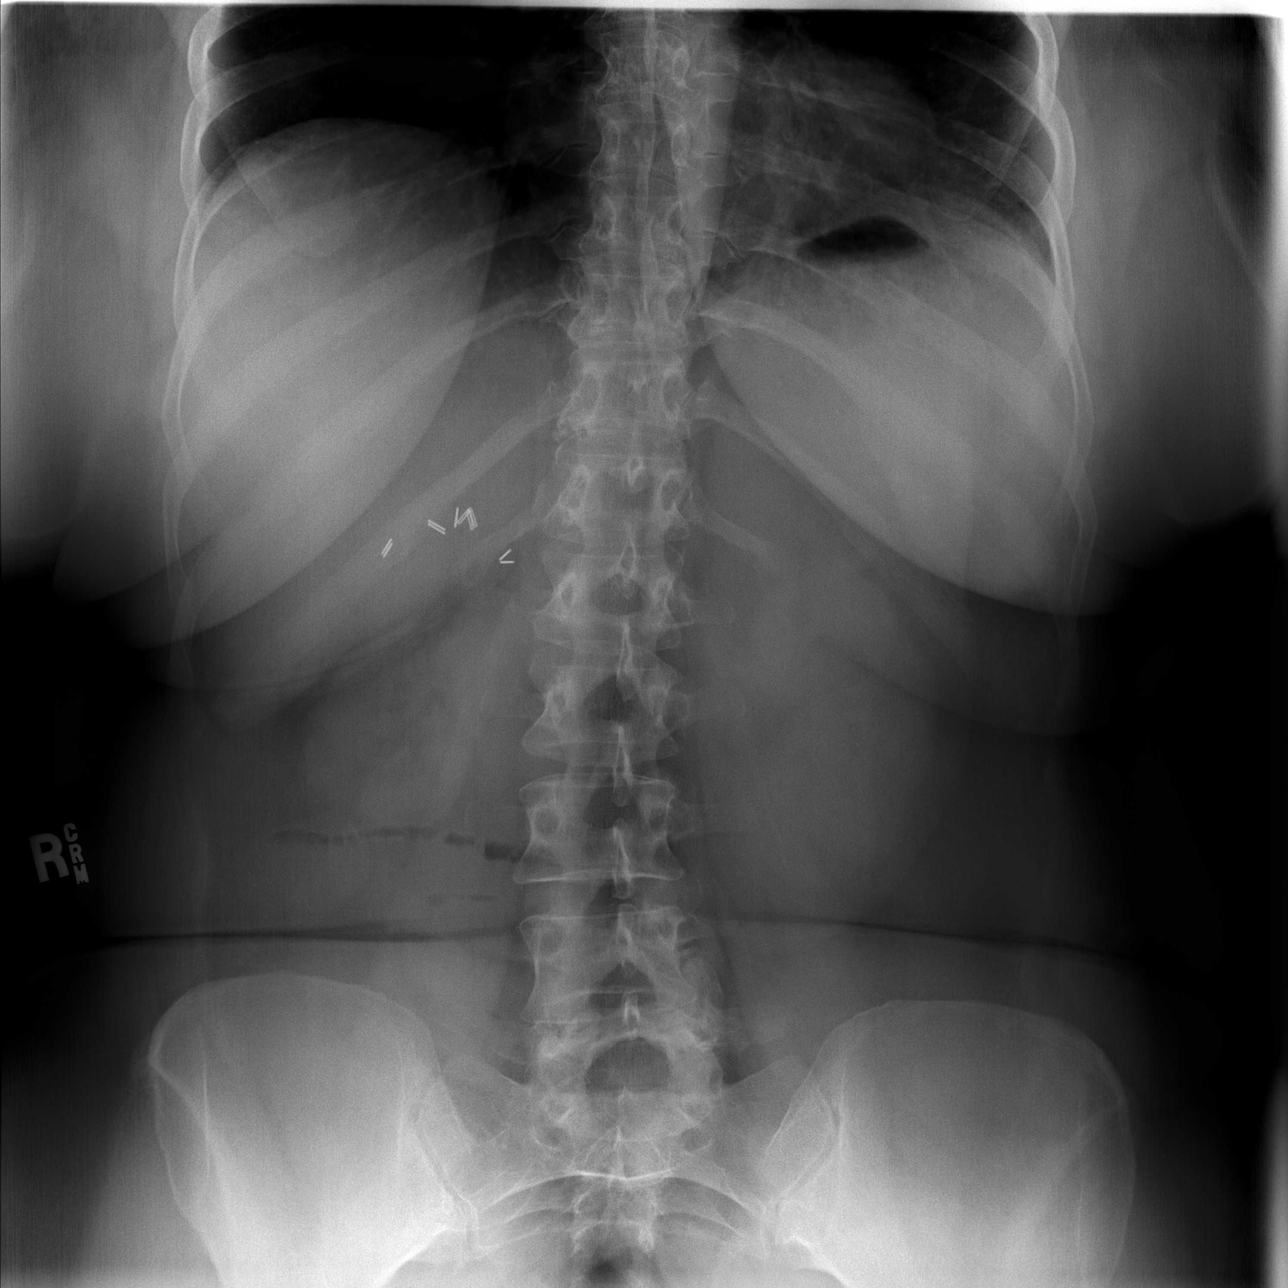

[3 of 3 positions shown; findings below may reference images not displayed]

FINDINGS: Single view of the chest ? The lungs are clear.  Heart size normal.  No effusion.  
 Abdomen ? There is no free intraperitoneal air.  The abdomen is relatively gasless.  Suture material is noted in the pelvis.
IMPRESSION: 1.  No acute cardiopulmonary disease.  
 2.  Negative for free intraperitoneal air with gasless abdomen.

## 2006-10-19 IMAGING — CT CT PELVIS W/O CM
1 of 2 series · 15 of 32 positions shown, 19 images · IV contrast ([ID]/WATER)
Comparison: [DATE].

CLINICAL DATA: Generalized abdominal pain.  Ulcerative colitis.
ABDOMEN CT WITHOUT CONTRAST:
TECHNIQUE: Multidetector CT imaging of the abdomen was performed following the standard protocol without IV contrast.
TECHNIQUE: Multidetector CT imaging of the pelvis was performed following the standard protocol without IV contrast.

[Series 2: routine abdomen · axial · 0.88mm/px · z∈[-470,-50]mm · 15 of 92 slices shown, 19 images]
[im 4/92  soft-tissue]
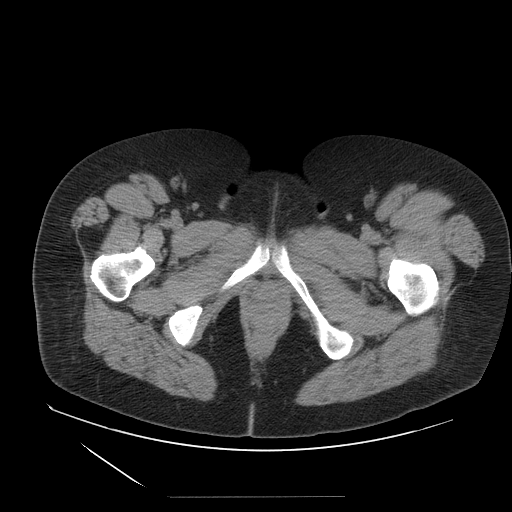
[im 4/92  bone]
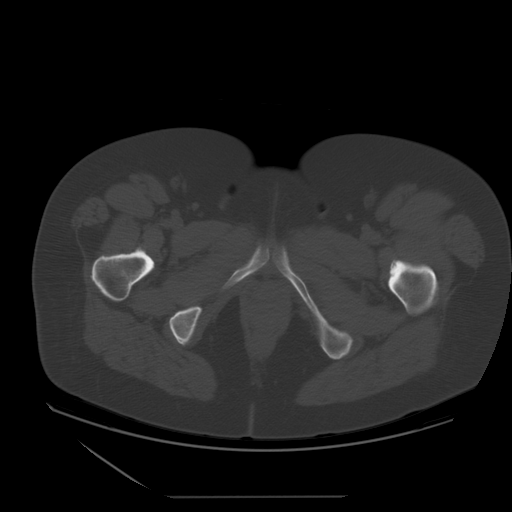
[im 12/92  soft-tissue]
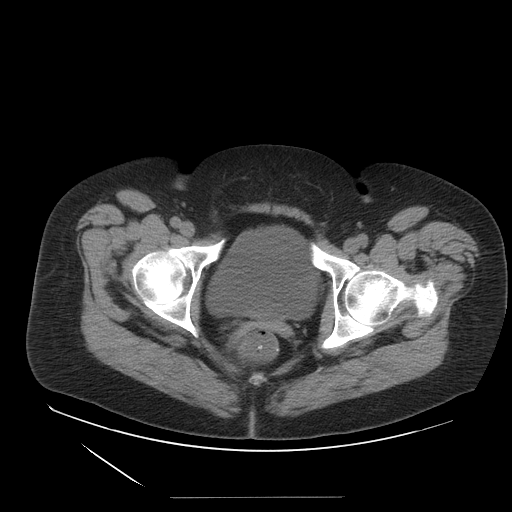
[im 19/92  soft-tissue]
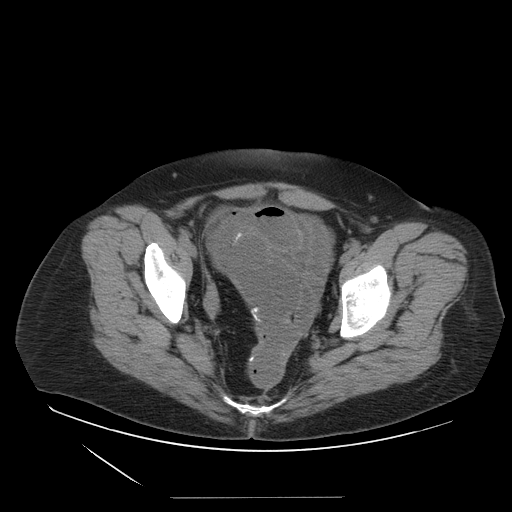
[im 27/92  soft-tissue]
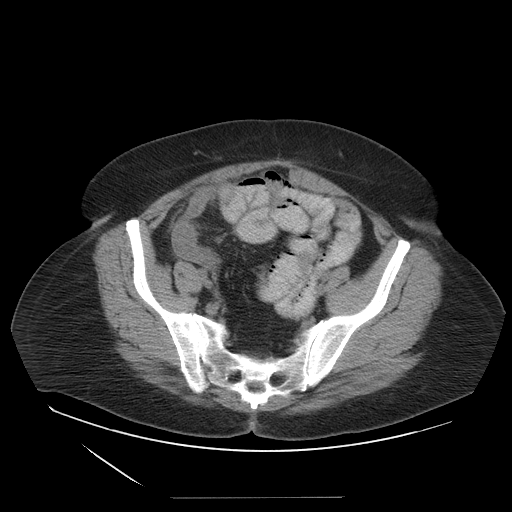
[im 31/92  soft-tissue]
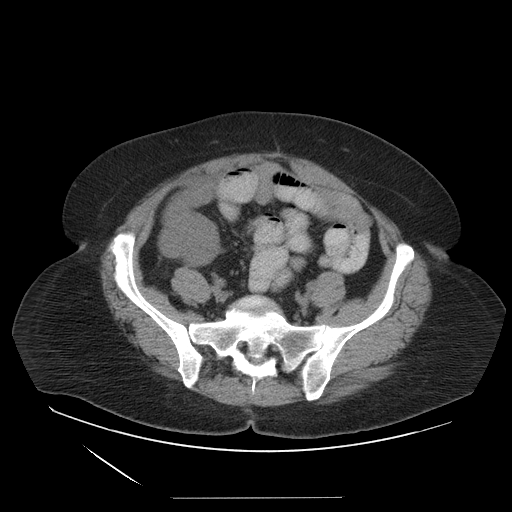
[im 38/92  soft-tissue]
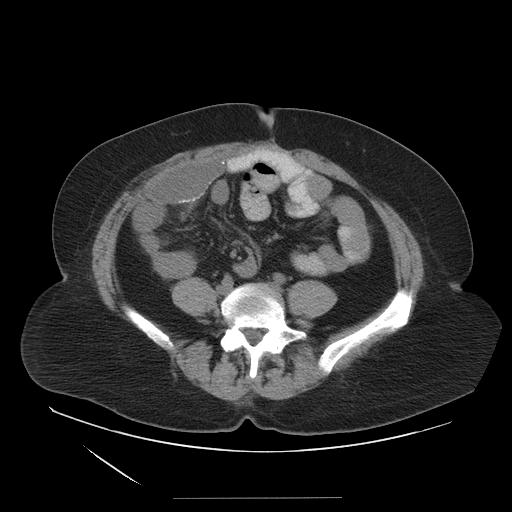
[im 46/92  soft-tissue]
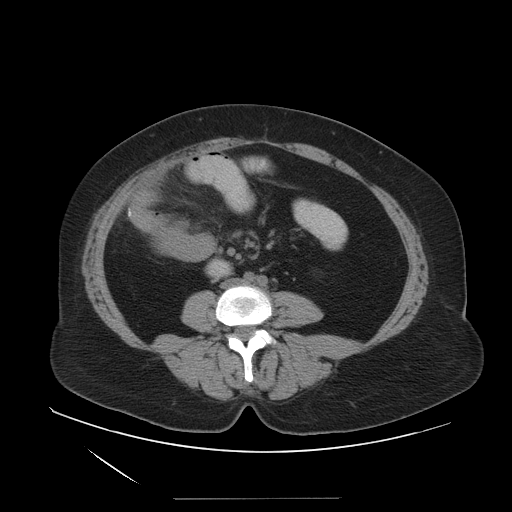
[im 54/92  soft-tissue]
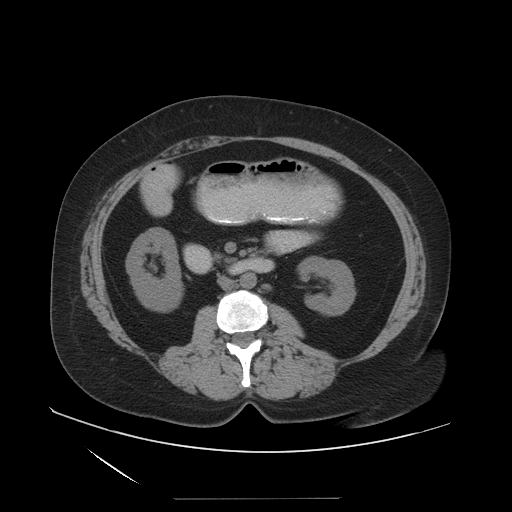
[im 61/92  soft-tissue]
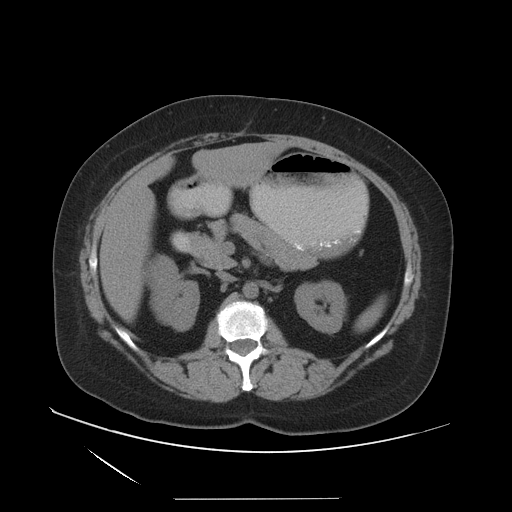
[im 61/92  bone]
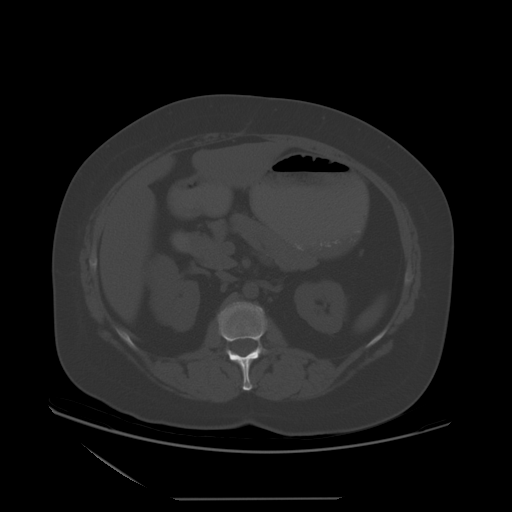
[im 65/92  soft-tissue]
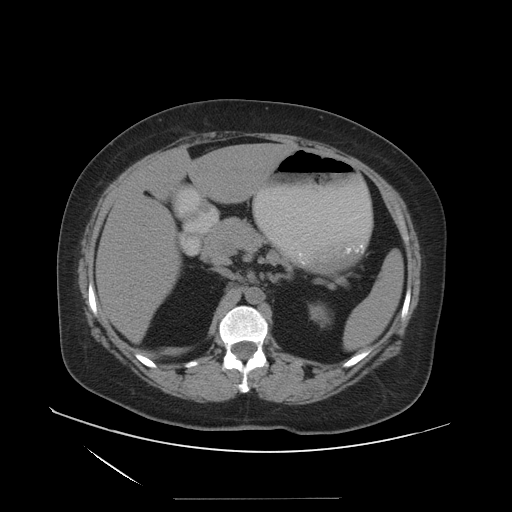
[im 73/92  soft-tissue]
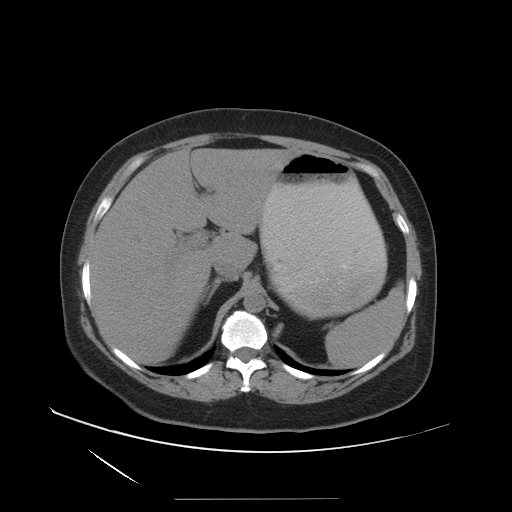
[im 76/92  lung]
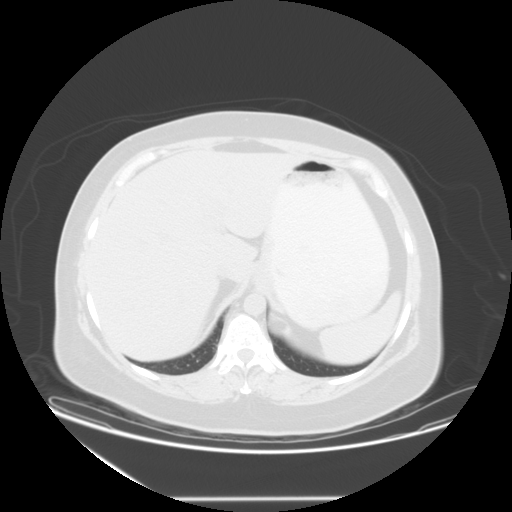
[im 80/92  soft-tissue]
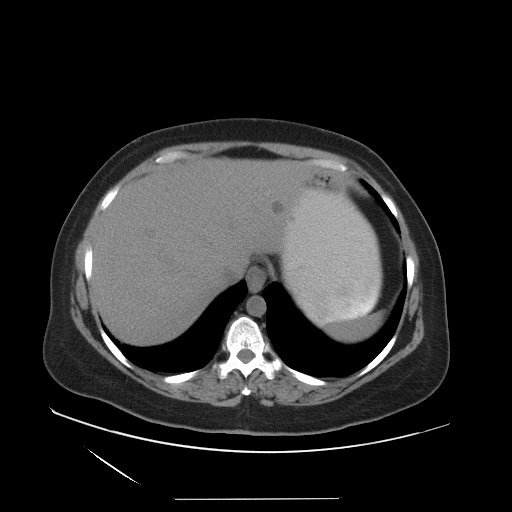
[im 80/92  lung]
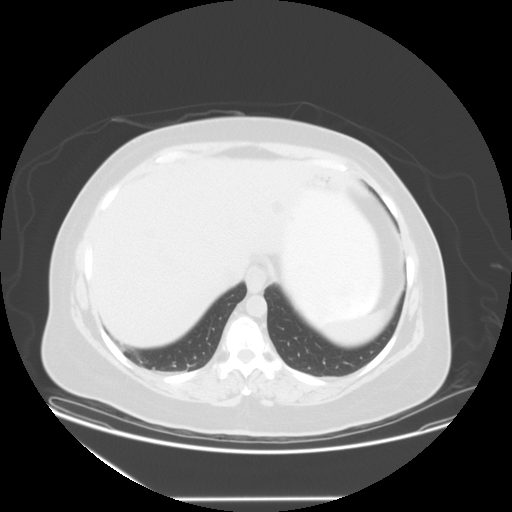
[im 84/92  lung]
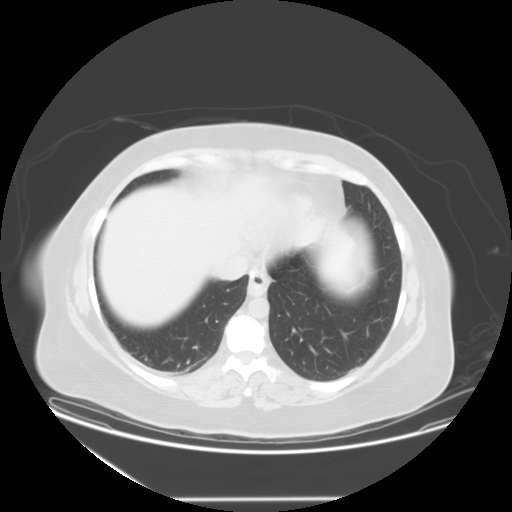
[im 88/92  soft-tissue]
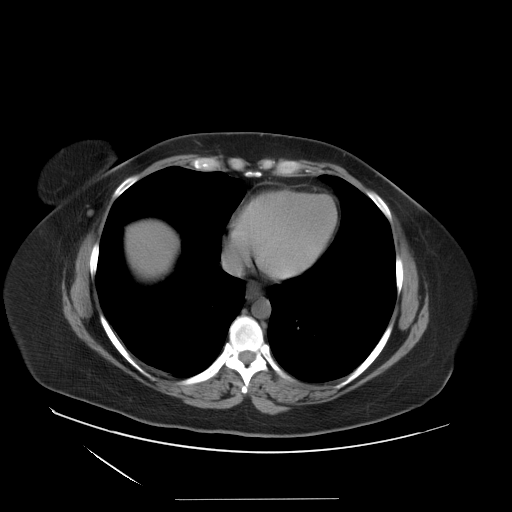
[im 88/92  lung]
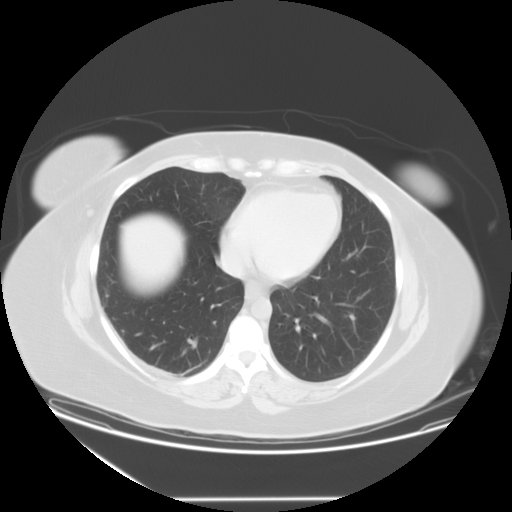

[15 of 32 positions shown; findings below may reference images not displayed]

FINDINGS: Please note that sensitivity and specificity for subtle/small lesions are decreased without the administration of IV contrast.  Distal esophageal wall appears mildly thickened.  Left hepatic lobe cyst is stable.  Cholecystectomy.  Adrenal glands, kidneys, spleen, pancreas, stomach, and proximal small bowel unremarkable.  There is very mild dilatation of distal small bowel with surrounding inflammatory changes and mesenteric haziness, in the right lower quadrant.  Findings are very similar to those seen on CT abdomen and pelvis [DATE].  There are small mesenteric and retroperitoneal lymph nodes.
IMPRESSION: Mild distal small bowel dilatation and surrounding inflammatory changes are very similar to those seen on [DATE].  
PELVIS CT WITHOUT CONTRAST:
FINDINGS: A 1.5 cm lymph node inferior to the aortic bifurcation is unchanged.  There are smaller lymph nodes in the pelvis.  Patient is status post colectomy.  Uterus has been removed. 
No worrisome lytic or sclerotic lesions.
IMPRESSION: Stable enlarged mesenteric lymph node.

## 2006-10-23 DIAGNOSIS — E785 Hyperlipidemia, unspecified: Secondary | ICD-10-CM | POA: Insufficient documentation

## 2006-10-23 DIAGNOSIS — K219 Gastro-esophageal reflux disease without esophagitis: Secondary | ICD-10-CM | POA: Insufficient documentation

## 2006-10-23 DIAGNOSIS — F32A Depression, unspecified: Secondary | ICD-10-CM | POA: Insufficient documentation

## 2006-10-23 DIAGNOSIS — I10 Essential (primary) hypertension: Secondary | ICD-10-CM | POA: Insufficient documentation

## 2006-10-23 DIAGNOSIS — F329 Major depressive disorder, single episode, unspecified: Secondary | ICD-10-CM

## 2006-11-10 ENCOUNTER — Encounter: Admission: RE | Admit: 2006-11-10 | Discharge: 2006-11-10 | Payer: Self-pay | Admitting: General Surgery

## 2006-11-10 ENCOUNTER — Encounter (INDEPENDENT_AMBULATORY_CARE_PROVIDER_SITE_OTHER): Payer: Self-pay | Admitting: *Deleted

## 2006-11-10 IMAGING — RF DG UGI W/ SMALL BOWEL HIGH DENSITY
19 of 24 series · 19 of 24 positions shown · non-contrast
Comparison: none

CLINICAL DATA: Abdominal pain.  Question, small bowel obstruction.   Prior colectomy with small bowel anastomosis with the rectum.
 KUB, UPPER G.I. WITH SMALL BOWEL FOLLOW-THROUGH:

[Series 1: run · 1 of 1 slices shown (1 of 19)]
[im 1/1]
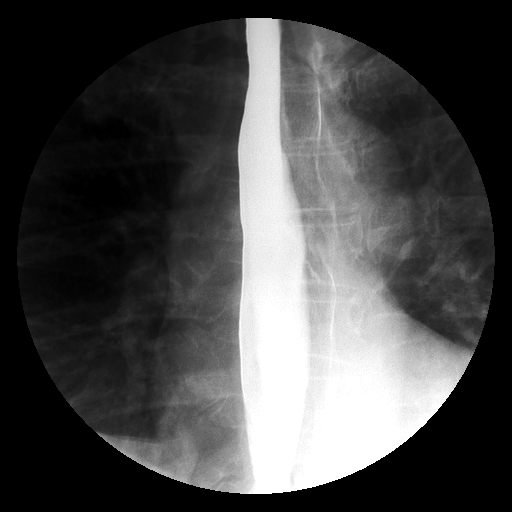

[Series 2: run · 1 of 1 slices shown (2 of 19)]
[im 1/1]
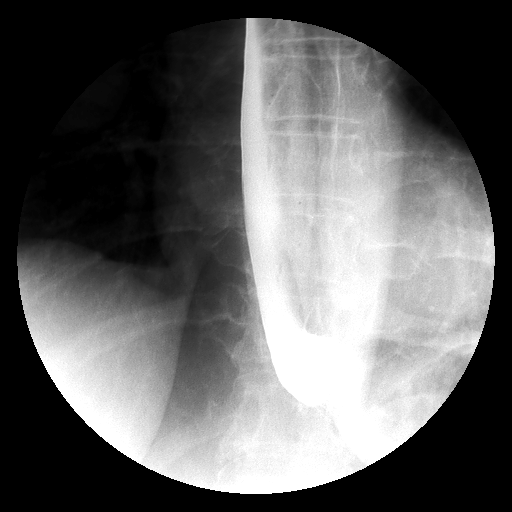

[Series 4: run · 1 of 1 slices shown (3 of 19)]
[im 1/1]
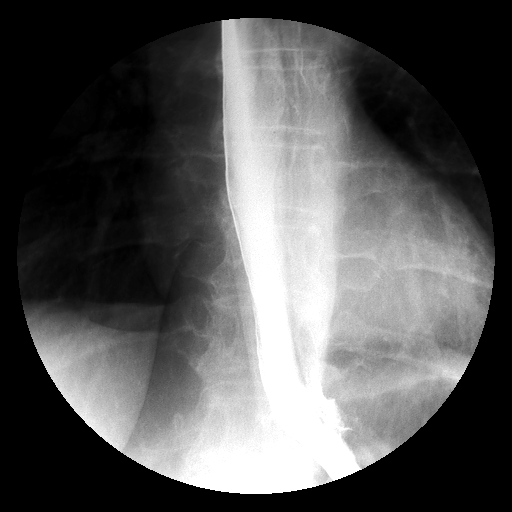

[Series 5: run · 1 of 1 slices shown (4 of 19)]
[im 1/1]
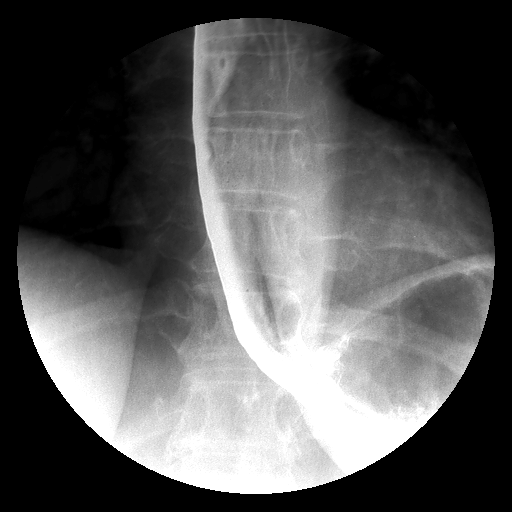

[Series 6: run · 1 of 1 slices shown (5 of 19)]
[im 1/1]
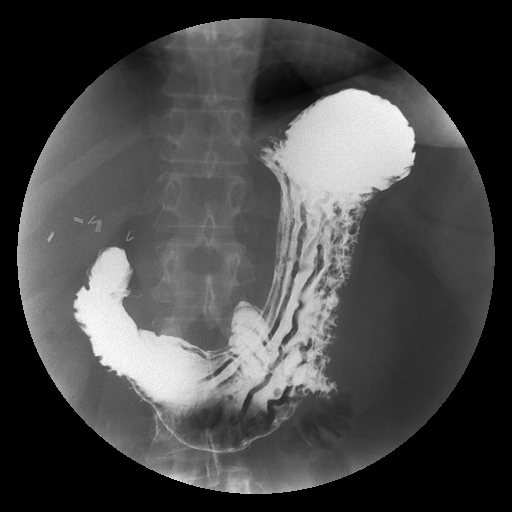

[Series 7: run · 1 of 1 slices shown (6 of 19)]
[im 1/1]
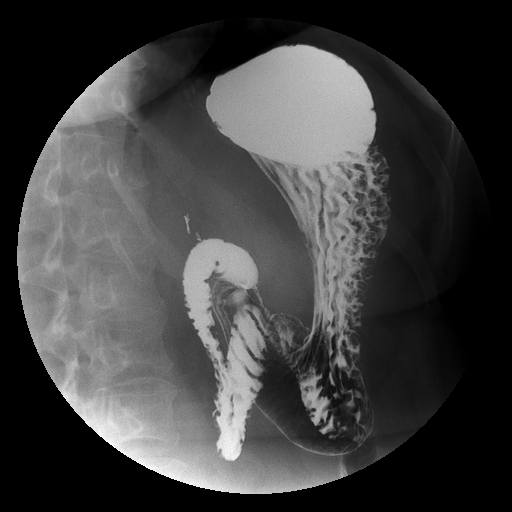

[Series 9: run · 1 of 1 slices shown (7 of 19)]
[im 1/1]
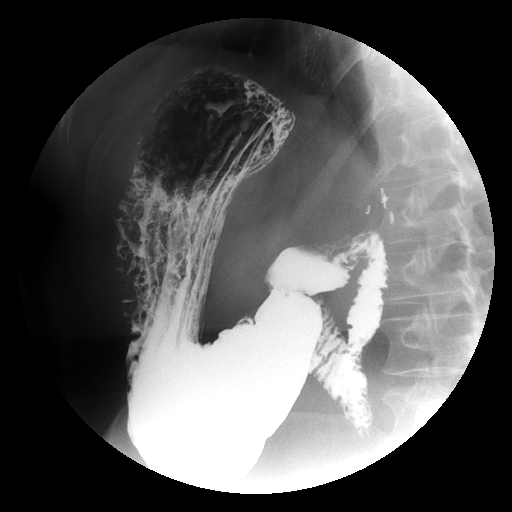

[Series 10: run · 1 of 1 slices shown (8 of 19)]
[im 1/1]
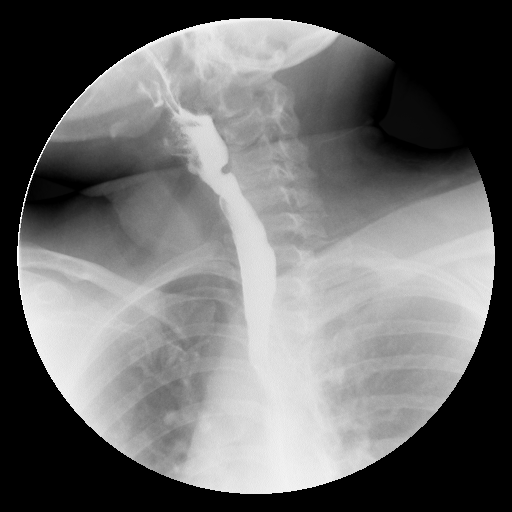

[Series 11: run · 1 of 1 slices shown (9 of 19)]
[im 1/1]
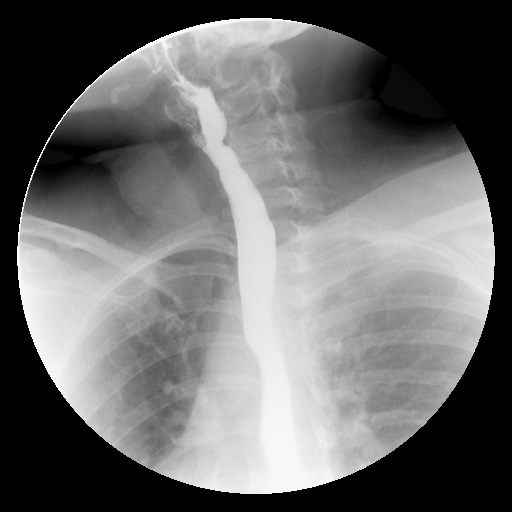

[Series 13: run · 1 of 1 slices shown (10 of 19)]
[im 1/1]
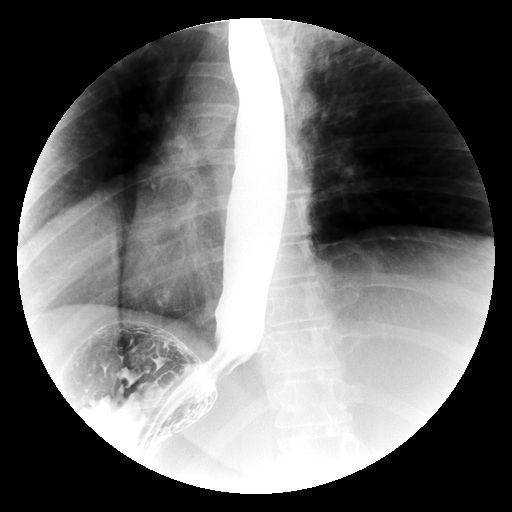

[Series 14: run · 1 of 1 slices shown (11 of 19)]
[im 1/1]
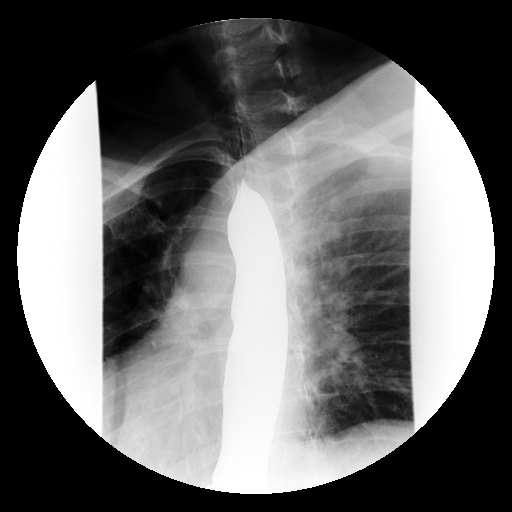

[Series 15: run · 1 of 1 slices shown (12 of 19)]
[im 1/1]
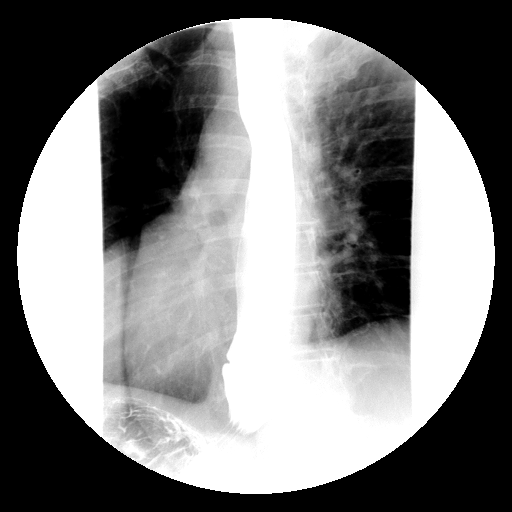

[Series 16: run · 1 of 1 slices shown (13 of 19)]
[im 1/1]
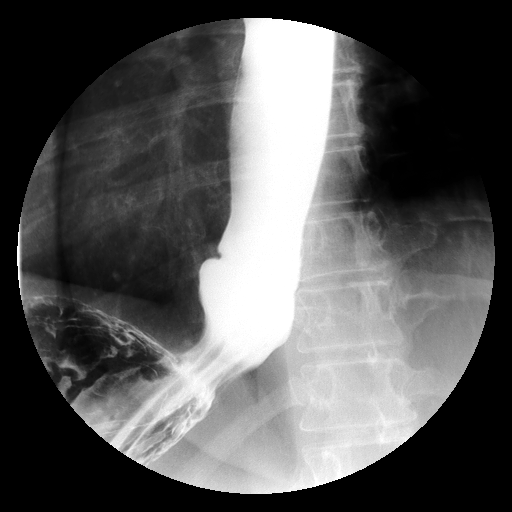

[Series 18: run · 1 of 1 slices shown (14 of 19)]
[im 1/1]
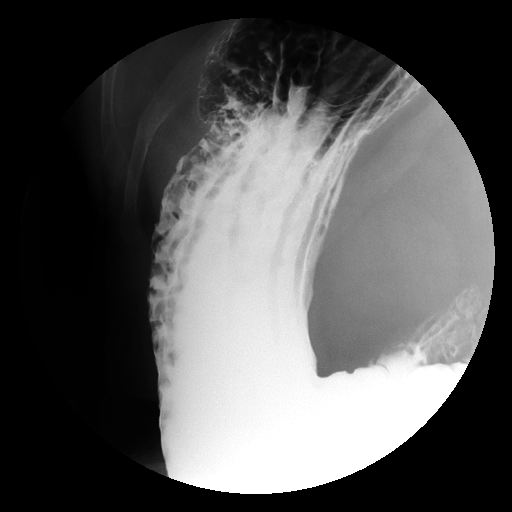

[Series 19: run · 1 of 1 slices shown (15 of 19)]
[im 1/1]
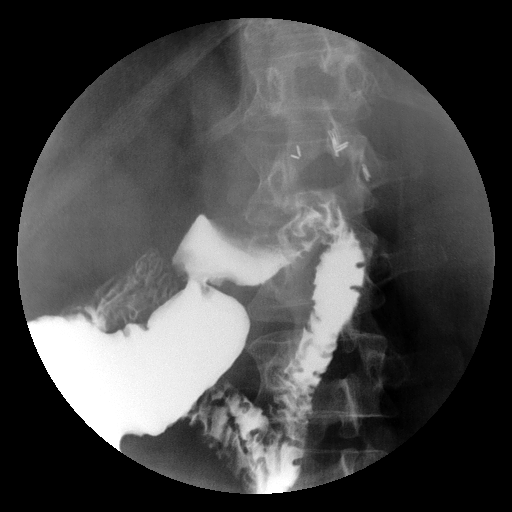

[Series 20: run · 1 of 1 slices shown (16 of 19)]
[im 1/1]
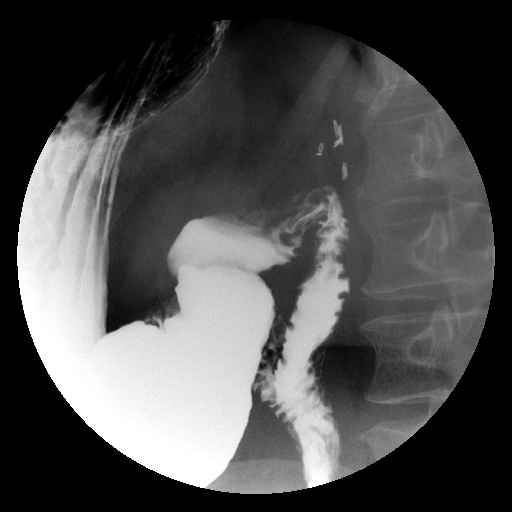

[Series 21: run · 1 of 1 slices shown (17 of 19)]
[im 1/1]
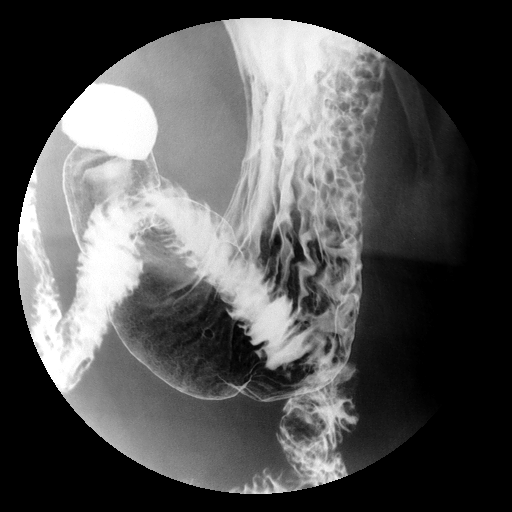

[Series 23: run · 1 of 1 slices shown (18 of 19)]
[im 1/1]
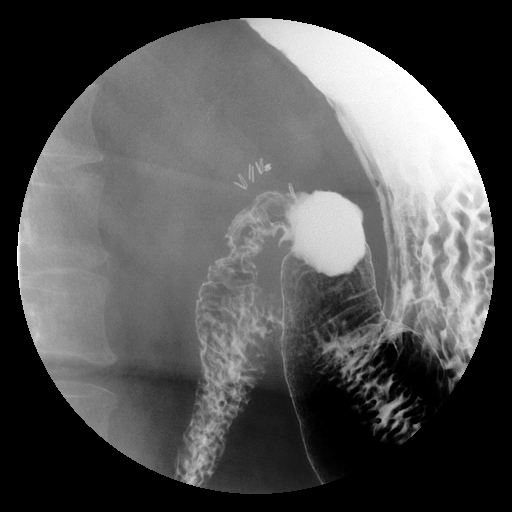

[Series 24: run · 1 of 1 slices shown (19 of 19)]
[im 1/1]
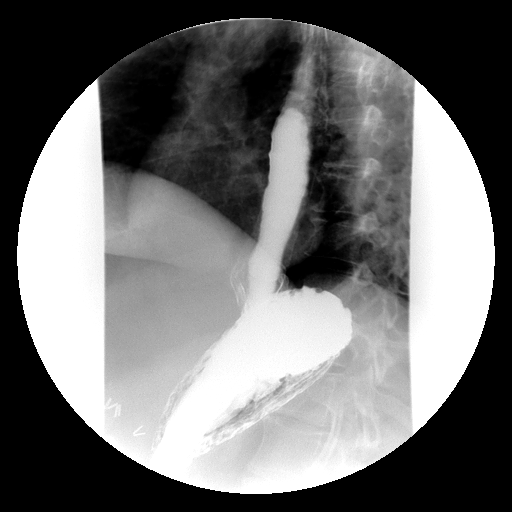

[19 of 24 positions shown; findings below may reference images not displayed]

FINDINGS: KUB:  A preliminary film of the abdomen shows a nonspecific bowel gas pattern.  Surgical clips are present in the right upper quadrant from prior cholecystectomy. Sutures are noted in the pelvis secondary to the anastomosis of small bowel and rectum. 
 UPPER G.I.:  A double contrast upper G.I. was performed.  The mucosa of the esophagus appears normal.  There is a small hiatal hernia present and mild reflux is noted with the water siphon maneuver.  The stomach is normal in contour and peristalsis.  The duodenal bulb fills well with no ulceration and the duodenal loop is in normal position. 
 SMALL BOWEL FOLLOW-THROUGH:  The patient was given barium orally and images of the small bowel were obtained.  The anastomosis of the small bowel and rectum is difficult to visualize well due to overlap of bowel.  There may be slight narrowing of the distal ileum as it anastomoses with the rectum, with very minimal dilatation of the more proximal small bowel.  However, no significant stricture is evident.  No mucosal edema is noted.
IMPRESSION: 1.  The anastomosis of distal ileum with the rectum is difficult to visualize well, and may be slightly narrowed relative to the minimally dilated more proximal small bowel, but no definite stricture is seen. 
 2.  Small hiatal hernia with reflux.   Prominent cricopharyngeus muscle.

## 2006-11-10 IMAGING — CR DG UGI W/ SMALL BOWEL HIGH DENSITY
1 series · 1 of 1 positions shown · non-contrast
Comparison: none

CLINICAL DATA: Abdominal pain.  Question, small bowel obstruction.   Prior colectomy with small bowel anastomosis with the rectum.
 KUB, UPPER G.I. WITH SMALL BOWEL FOLLOW-THROUGH:

[view not recorded]
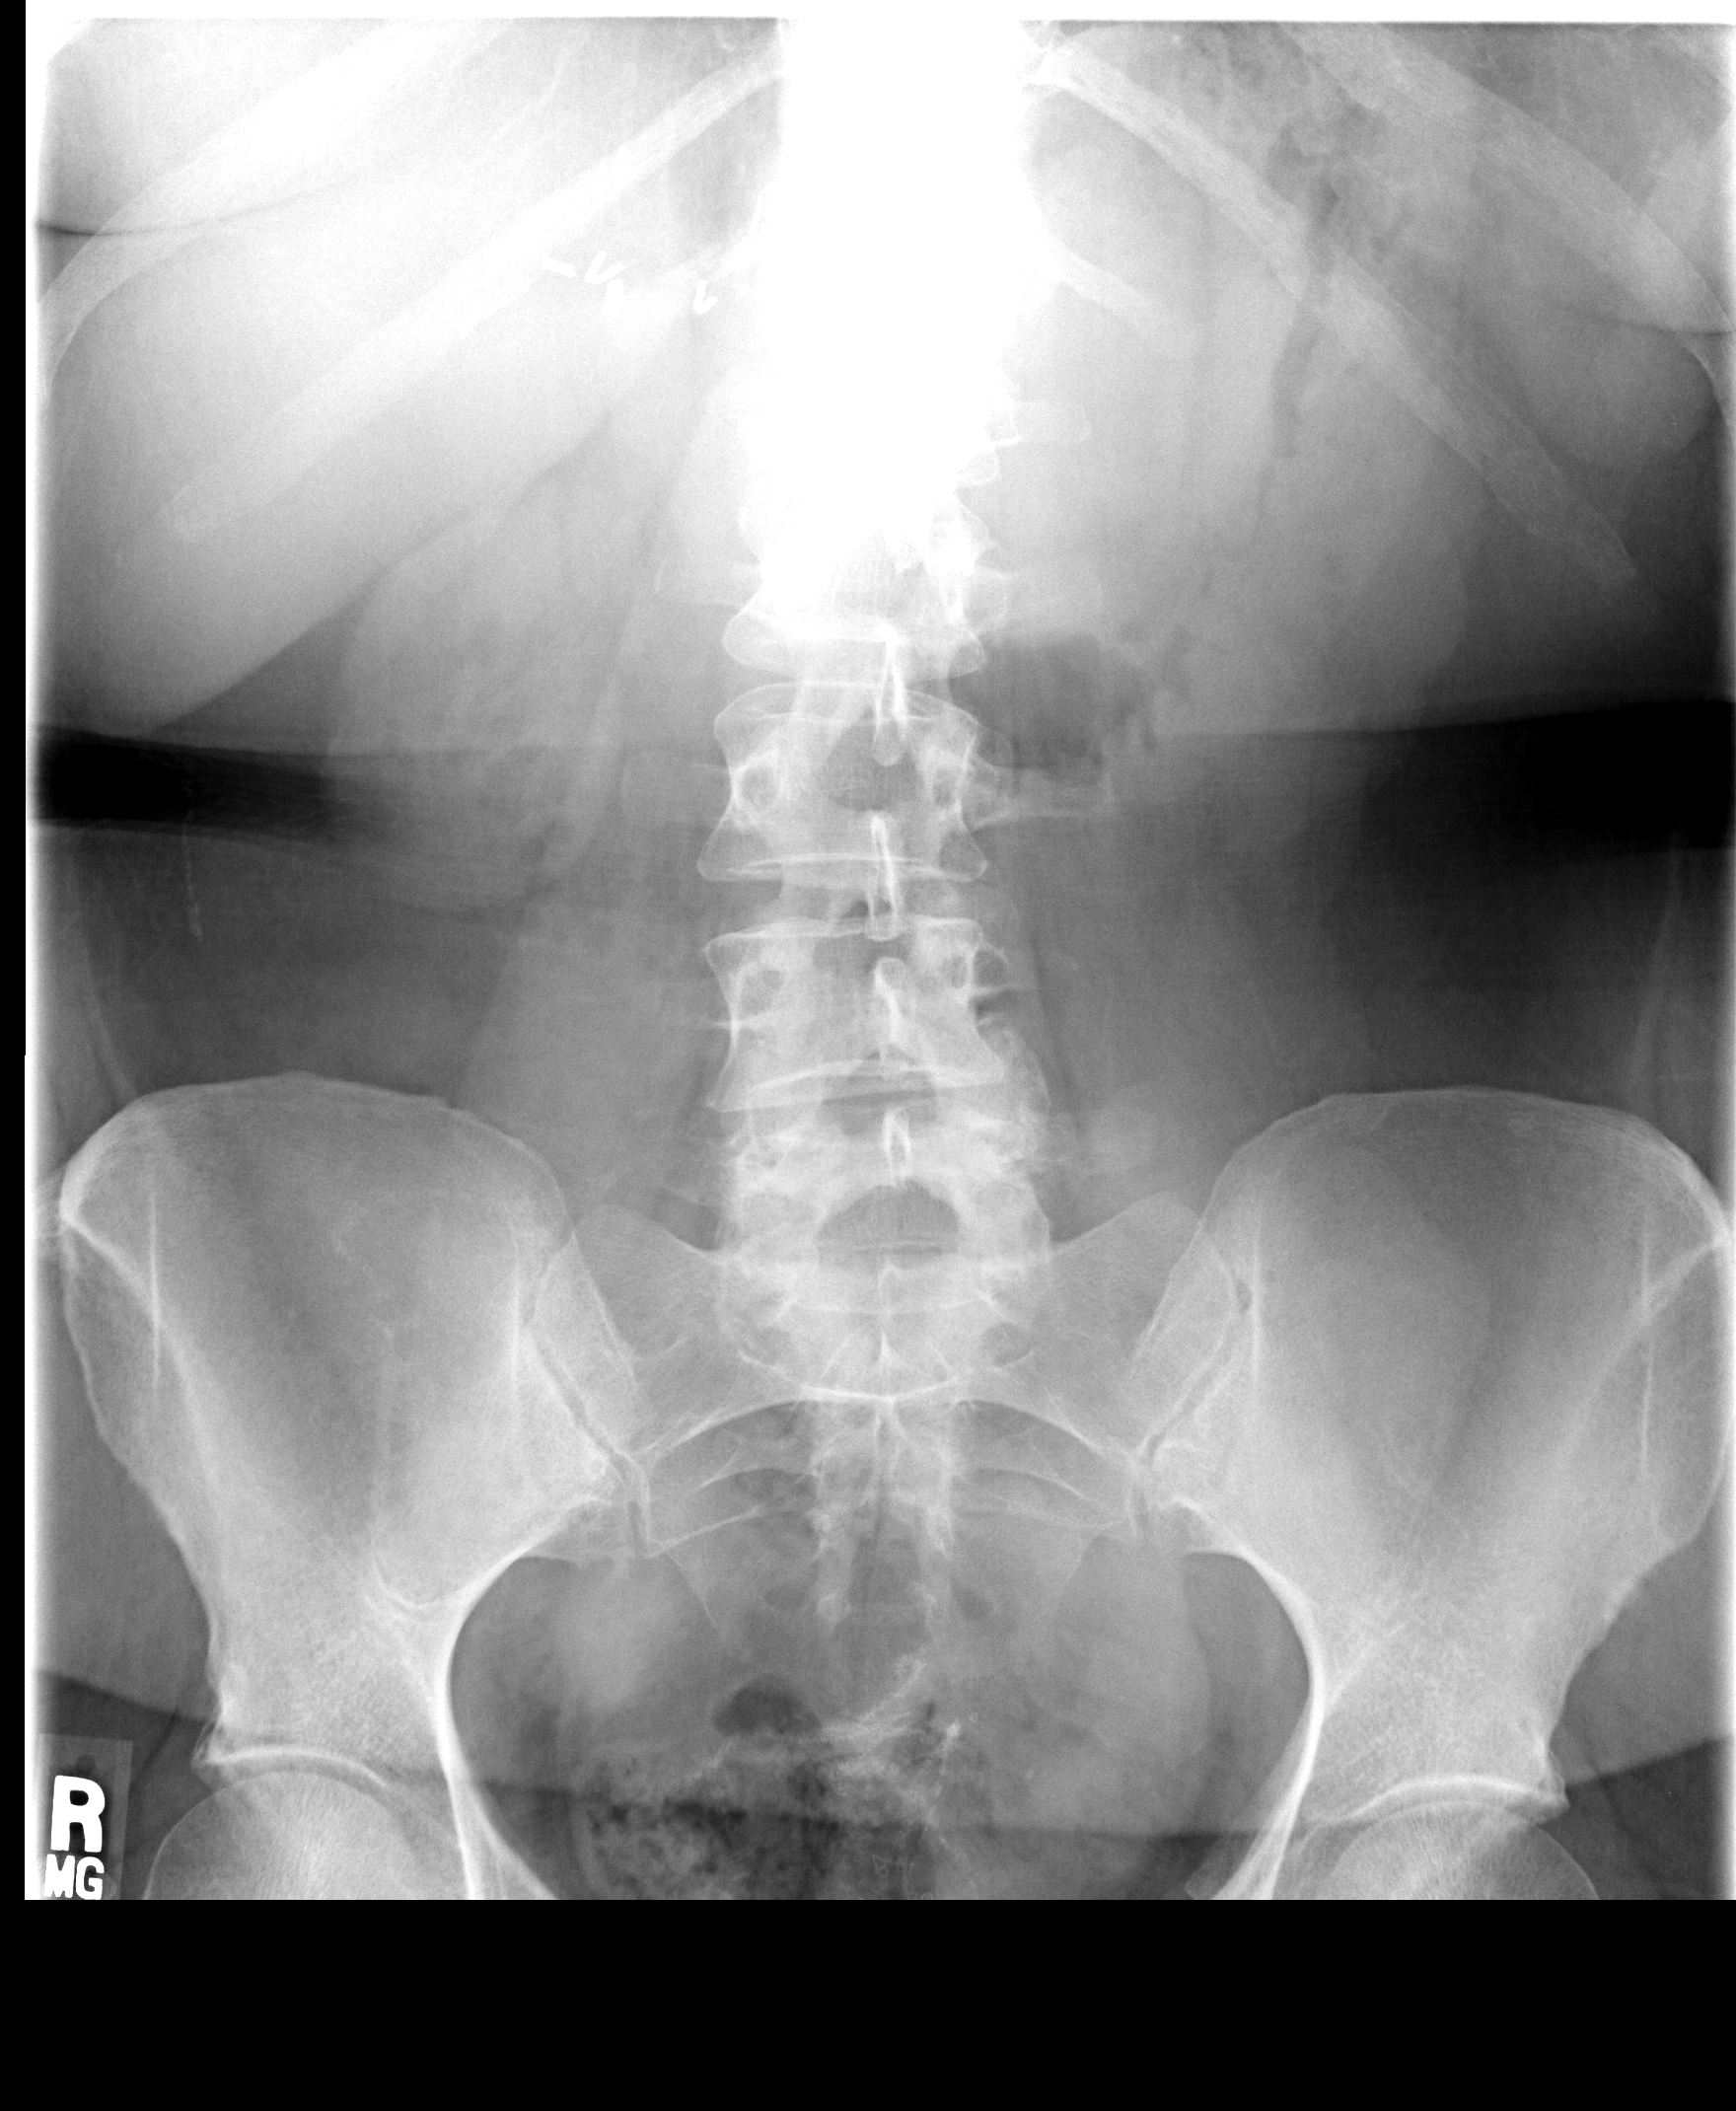

[1 of 1 positions shown; findings below may reference images not displayed]

FINDINGS: KUB:  A preliminary film of the abdomen shows a nonspecific bowel gas pattern.  Surgical clips are present in the right upper quadrant from prior cholecystectomy. Sutures are noted in the pelvis secondary to the anastomosis of small bowel and rectum. 
 UPPER G.I.:  A double contrast upper G.I. was performed.  The mucosa of the esophagus appears normal.  There is a small hiatal hernia present and mild reflux is noted with the water siphon maneuver.  The stomach is normal in contour and peristalsis.  The duodenal bulb fills well with no ulceration and the duodenal loop is in normal position. 
 SMALL BOWEL FOLLOW-THROUGH:  The patient was given barium orally and images of the small bowel were obtained.  The anastomosis of the small bowel and rectum is difficult to visualize well due to overlap of bowel.  There may be slight narrowing of the distal ileum as it anastomoses with the rectum, with very minimal dilatation of the more proximal small bowel.  However, no significant stricture is evident.  No mucosal edema is noted.
IMPRESSION: 1.  The anastomosis of distal ileum with the rectum is difficult to visualize well, and may be slightly narrowed relative to the minimally dilated more proximal small bowel, but no definite stricture is seen. 
 2.  Small hiatal hernia with reflux.   Prominent cricopharyngeus muscle.

## 2007-02-03 ENCOUNTER — Encounter (INDEPENDENT_AMBULATORY_CARE_PROVIDER_SITE_OTHER): Payer: Self-pay | Admitting: Family Medicine

## 2007-02-22 ENCOUNTER — Encounter (INDEPENDENT_AMBULATORY_CARE_PROVIDER_SITE_OTHER): Payer: Self-pay | Admitting: Family Medicine

## 2007-04-04 ENCOUNTER — Other Ambulatory Visit: Admission: RE | Admit: 2007-04-04 | Discharge: 2007-04-04 | Payer: Self-pay | Admitting: Gynecology

## 2007-04-17 ENCOUNTER — Ambulatory Visit: Payer: Self-pay | Admitting: Internal Medicine

## 2007-05-25 ENCOUNTER — Ambulatory Visit: Payer: Self-pay | Admitting: Family Medicine

## 2007-05-25 DIAGNOSIS — M654 Radial styloid tenosynovitis [de Quervain]: Secondary | ICD-10-CM | POA: Insufficient documentation

## 2007-05-25 DIAGNOSIS — H9319 Tinnitus, unspecified ear: Secondary | ICD-10-CM | POA: Insufficient documentation

## 2007-05-28 LAB — CONVERTED CEMR LAB: Glucose, Bld: 99 mg/dL (ref 70–99)

## 2007-05-29 ENCOUNTER — Telehealth (INDEPENDENT_AMBULATORY_CARE_PROVIDER_SITE_OTHER): Payer: Self-pay | Admitting: *Deleted

## 2007-06-29 ENCOUNTER — Ambulatory Visit (HOSPITAL_COMMUNITY): Payer: Self-pay | Admitting: Psychiatry

## 2007-07-03 ENCOUNTER — Ambulatory Visit (HOSPITAL_COMMUNITY): Payer: Self-pay | Admitting: Marriage and Family Therapist

## 2007-07-05 ENCOUNTER — Ambulatory Visit: Payer: Self-pay | Admitting: Internal Medicine

## 2007-07-05 ENCOUNTER — Telehealth (INDEPENDENT_AMBULATORY_CARE_PROVIDER_SITE_OTHER): Payer: Self-pay | Admitting: Family Medicine

## 2007-07-05 LAB — CONVERTED CEMR LAB
Basophils Absolute: 0.1 10*3/uL (ref 0.0–0.1)
Basophils Relative: 0.8 % (ref 0.0–1.0)
Eosinophils Absolute: 0.3 10*3/uL (ref 0.0–0.6)
Eosinophils Relative: 3 % (ref 0.0–5.0)
HCT: 32.6 % — ABNORMAL LOW (ref 36.0–46.0)
Hemoglobin: 11.3 g/dL — ABNORMAL LOW (ref 12.0–15.0)
Iron: 35 ug/dL — ABNORMAL LOW (ref 42–145)
Lymphocytes Relative: 34.9 % (ref 12.0–46.0)
MCHC: 34.7 g/dL (ref 30.0–36.0)
MCV: 90.1 fL (ref 78.0–100.0)
Monocytes Absolute: 0.8 10*3/uL — ABNORMAL HIGH (ref 0.2–0.7)
Monocytes Relative: 6.9 % (ref 3.0–11.0)
Neutro Abs: 6 10*3/uL (ref 1.4–7.7)
Neutrophils Relative %: 54.4 % (ref 43.0–77.0)
Platelets: 473 10*3/uL — ABNORMAL HIGH (ref 150–400)
RBC: 3.62 M/uL — ABNORMAL LOW (ref 3.87–5.11)
RDW: 13.2 % (ref 11.5–14.6)
Saturation Ratios: 10.4 % — ABNORMAL LOW (ref 20.0–50.0)
Transferrin: 240.6 mg/dL (ref >=212.0)
WBC: 11 10*3/uL — ABNORMAL HIGH (ref 4.5–10.5)

## 2007-07-11 ENCOUNTER — Ambulatory Visit (HOSPITAL_COMMUNITY): Payer: Self-pay | Admitting: Marriage and Family Therapist

## 2007-07-18 ENCOUNTER — Ambulatory Visit (HOSPITAL_COMMUNITY): Admission: RE | Admit: 2007-07-18 | Discharge: 2007-07-18 | Payer: Self-pay | Admitting: Internal Medicine

## 2007-07-19 ENCOUNTER — Ambulatory Visit (HOSPITAL_COMMUNITY): Payer: Self-pay | Admitting: Marriage and Family Therapist

## 2007-07-25 ENCOUNTER — Telehealth (INDEPENDENT_AMBULATORY_CARE_PROVIDER_SITE_OTHER): Payer: Self-pay | Admitting: *Deleted

## 2007-07-26 ENCOUNTER — Ambulatory Visit: Payer: Self-pay | Admitting: Family Medicine

## 2007-07-26 LAB — CONVERTED CEMR LAB: Rapid Strep: NEGATIVE

## 2007-08-02 ENCOUNTER — Ambulatory Visit (HOSPITAL_COMMUNITY): Payer: Self-pay | Admitting: Marriage and Family Therapist

## 2007-08-14 ENCOUNTER — Ambulatory Visit (HOSPITAL_COMMUNITY): Payer: Self-pay | Admitting: Psychiatry

## 2007-08-14 ENCOUNTER — Ambulatory Visit: Payer: Self-pay | Admitting: Internal Medicine

## 2007-08-14 LAB — CONVERTED CEMR LAB
Basophils Absolute: 0.1 10*3/uL (ref 0.0–0.1)
Basophils Relative: 0.5 % (ref 0.0–1.0)
Eosinophils Absolute: 0.3 10*3/uL (ref 0.0–0.6)
Eosinophils Relative: 2.5 % (ref 0.0–5.0)
HCT: 31.9 % — ABNORMAL LOW (ref 36.0–46.0)
Hemoglobin: 10.9 g/dL — ABNORMAL LOW (ref 12.0–15.0)
Iron: 58 ug/dL (ref 42–145)
Lymphocytes Relative: 28.8 % (ref 12.0–46.0)
MCHC: 34.2 g/dL (ref 30.0–36.0)
MCV: 91 fL (ref 78.0–100.0)
Monocytes Absolute: 0.8 10*3/uL — ABNORMAL HIGH (ref 0.2–0.7)
Monocytes Relative: 7.8 % (ref 3.0–11.0)
Neutro Abs: 6.3 10*3/uL (ref 1.4–7.7)
Neutrophils Relative %: 60.4 % (ref 43.0–77.0)
Platelets: 515 10*3/uL — ABNORMAL HIGH (ref 150–400)
RBC: 3.51 M/uL — ABNORMAL LOW (ref 3.87–5.11)
RDW: 13.7 % (ref 11.5–14.6)
Saturation Ratios: 19.4 % — ABNORMAL LOW (ref 20.0–50.0)
Transferrin: 213.9 mg/dL (ref >=212.0)
WBC: 10.5 10*3/uL (ref 4.5–10.5)

## 2007-08-15 ENCOUNTER — Ambulatory Visit: Payer: Self-pay | Admitting: Family Medicine

## 2007-08-17 ENCOUNTER — Ambulatory Visit (HOSPITAL_COMMUNITY): Payer: Self-pay | Admitting: Marriage and Family Therapist

## 2007-08-21 ENCOUNTER — Encounter (INDEPENDENT_AMBULATORY_CARE_PROVIDER_SITE_OTHER): Payer: Self-pay | Admitting: Family Medicine

## 2007-09-11 ENCOUNTER — Encounter (INDEPENDENT_AMBULATORY_CARE_PROVIDER_SITE_OTHER): Payer: Self-pay | Admitting: Family Medicine

## 2007-10-16 ENCOUNTER — Ambulatory Visit: Payer: Self-pay | Admitting: Family Medicine

## 2007-10-16 LAB — CONVERTED CEMR LAB
BUN: 16 mg/dL (ref 6–23)
CO2: 27 meq/L (ref 19–32)
Calcium: 9.1 mg/dL (ref 8.4–10.5)
Chloride: 100 meq/L (ref 96–112)
Cholesterol: 191 mg/dL (ref 0–200)
Creatinine, Ser: 1.7 mg/dL — ABNORMAL HIGH (ref 0.4–1.2)
GFR calc Af Amer: 40 mL/min
GFR calc non Af Amer: 33 mL/min
Glucose, Bld: 95 mg/dL (ref 70–99)
HDL: 38.1 mg/dL — ABNORMAL LOW (ref >39.0)
LDL Cholesterol: 124 mg/dL — ABNORMAL HIGH (ref 0–99)
Potassium: 3.6 meq/L (ref 3.5–5.1)
Sodium: 135 meq/L (ref 135–145)
Total CHOL/HDL Ratio: 5
Triglycerides: 142 mg/dL (ref 0–149)
VLDL: 28 mg/dL (ref 0–40)

## 2007-10-17 ENCOUNTER — Encounter (INDEPENDENT_AMBULATORY_CARE_PROVIDER_SITE_OTHER): Payer: Self-pay | Admitting: *Deleted

## 2007-10-17 ENCOUNTER — Ambulatory Visit (HOSPITAL_COMMUNITY): Payer: Self-pay | Admitting: Psychiatry

## 2007-11-09 ENCOUNTER — Ambulatory Visit: Payer: Self-pay | Admitting: Internal Medicine

## 2007-11-09 LAB — CONVERTED CEMR LAB
Basophils Absolute: 0.1 10*3/uL (ref 0.0–0.1)
Basophils Relative: 0.8 % (ref 0.0–1.0)
Eosinophils Absolute: 0.5 10*3/uL (ref 0.0–0.6)
Eosinophils Relative: 4.4 % (ref 0.0–5.0)
HCT: 31.3 % — ABNORMAL LOW (ref 36.0–46.0)
Hemoglobin: 10.7 g/dL — ABNORMAL LOW (ref 12.0–15.0)
Iron: 56 ug/dL (ref 42–145)
Lymphocytes Relative: 35.5 % (ref 12.0–46.0)
MCHC: 34.1 g/dL (ref 30.0–36.0)
MCV: 91.9 fL (ref 78.0–100.0)
Monocytes Absolute: 0.8 10*3/uL — ABNORMAL HIGH (ref 0.2–0.7)
Monocytes Relative: 7.9 % (ref 3.0–11.0)
Neutro Abs: 5.2 10*3/uL (ref 1.4–7.7)
Neutrophils Relative %: 51.4 % (ref 43.0–77.0)
Platelets: 476 10*3/uL — ABNORMAL HIGH (ref 150–400)
RBC: 3.41 M/uL — ABNORMAL LOW (ref 3.87–5.11)
RDW: 13 % (ref 11.5–14.6)
Saturation Ratios: 17.4 % — ABNORMAL LOW (ref 20.0–50.0)
Transferrin: 229.6 mg/dL (ref >=212.0)
WBC: 10.3 10*3/uL (ref 4.5–10.5)

## 2007-11-16 ENCOUNTER — Ambulatory Visit: Payer: Self-pay | Admitting: Internal Medicine

## 2007-11-16 DIAGNOSIS — N259 Disorder resulting from impaired renal tubular function, unspecified: Secondary | ICD-10-CM | POA: Insufficient documentation

## 2007-11-21 ENCOUNTER — Encounter (HOSPITAL_COMMUNITY): Admission: RE | Admit: 2007-11-21 | Discharge: 2008-02-19 | Payer: Self-pay | Admitting: Internal Medicine

## 2007-11-23 ENCOUNTER — Encounter: Payer: Self-pay | Admitting: Family Medicine

## 2007-12-11 ENCOUNTER — Telehealth (INDEPENDENT_AMBULATORY_CARE_PROVIDER_SITE_OTHER): Payer: Self-pay | Admitting: *Deleted

## 2007-12-15 ENCOUNTER — Encounter: Payer: Self-pay | Admitting: Internal Medicine

## 2007-12-15 ENCOUNTER — Inpatient Hospital Stay (HOSPITAL_COMMUNITY): Admission: EM | Admit: 2007-12-15 | Discharge: 2007-12-17 | Payer: Self-pay | Admitting: Emergency Medicine

## 2007-12-15 ENCOUNTER — Ambulatory Visit: Payer: Self-pay | Admitting: Internal Medicine

## 2007-12-15 DIAGNOSIS — D509 Iron deficiency anemia, unspecified: Secondary | ICD-10-CM | POA: Insufficient documentation

## 2007-12-15 DIAGNOSIS — E860 Dehydration: Secondary | ICD-10-CM | POA: Insufficient documentation

## 2007-12-15 DIAGNOSIS — F411 Generalized anxiety disorder: Secondary | ICD-10-CM | POA: Insufficient documentation

## 2007-12-15 DIAGNOSIS — D72829 Elevated white blood cell count, unspecified: Secondary | ICD-10-CM | POA: Insufficient documentation

## 2007-12-15 DIAGNOSIS — E871 Hypo-osmolality and hyponatremia: Secondary | ICD-10-CM | POA: Insufficient documentation

## 2007-12-15 DIAGNOSIS — E876 Hypokalemia: Secondary | ICD-10-CM | POA: Insufficient documentation

## 2008-01-01 ENCOUNTER — Ambulatory Visit (HOSPITAL_COMMUNITY): Payer: Self-pay | Admitting: Marriage and Family Therapist

## 2008-01-04 ENCOUNTER — Telehealth: Payer: Self-pay | Admitting: Internal Medicine

## 2008-01-10 ENCOUNTER — Encounter (INDEPENDENT_AMBULATORY_CARE_PROVIDER_SITE_OTHER): Payer: Self-pay | Admitting: *Deleted

## 2008-01-19 ENCOUNTER — Encounter: Payer: Self-pay | Admitting: Family Medicine

## 2008-01-24 ENCOUNTER — Ambulatory Visit (HOSPITAL_COMMUNITY): Payer: Self-pay | Admitting: Psychiatry

## 2008-01-30 ENCOUNTER — Telehealth: Payer: Self-pay | Admitting: Internal Medicine

## 2008-02-02 ENCOUNTER — Ambulatory Visit: Payer: Self-pay | Admitting: Internal Medicine

## 2008-02-02 DIAGNOSIS — R55 Syncope and collapse: Secondary | ICD-10-CM | POA: Insufficient documentation

## 2008-02-05 ENCOUNTER — Encounter: Payer: Self-pay | Admitting: Internal Medicine

## 2008-02-05 ENCOUNTER — Telehealth: Payer: Self-pay | Admitting: Internal Medicine

## 2008-02-05 DIAGNOSIS — D649 Anemia, unspecified: Secondary | ICD-10-CM | POA: Insufficient documentation

## 2008-02-05 DIAGNOSIS — R197 Diarrhea, unspecified: Secondary | ICD-10-CM

## 2008-02-05 DIAGNOSIS — E669 Obesity, unspecified: Secondary | ICD-10-CM

## 2008-02-05 DIAGNOSIS — K624 Stenosis of anus and rectum: Secondary | ICD-10-CM | POA: Insufficient documentation

## 2008-02-05 DIAGNOSIS — R195 Other fecal abnormalities: Secondary | ICD-10-CM | POA: Insufficient documentation

## 2008-02-05 DIAGNOSIS — Z8719 Personal history of other diseases of the digestive system: Secondary | ICD-10-CM | POA: Insufficient documentation

## 2008-02-05 DIAGNOSIS — Z862 Personal history of diseases of the blood and blood-forming organs and certain disorders involving the immune mechanism: Secondary | ICD-10-CM | POA: Insufficient documentation

## 2008-02-05 DIAGNOSIS — Z8639 Personal history of other endocrine, nutritional and metabolic disease: Secondary | ICD-10-CM | POA: Insufficient documentation

## 2008-02-05 LAB — CONVERTED CEMR LAB
ALT: 32 units/L (ref 0–35)
AST: 59 units/L — ABNORMAL HIGH (ref 0–37)
Albumin: 3.7 g/dL (ref 3.5–5.2)
Alkaline Phosphatase: 71 units/L (ref 39–117)
BUN: 21 mg/dL (ref 6–23)
Basophils Absolute: 0.1 10*3/uL (ref 0.0–0.1)
Basophils Relative: 0.7 % (ref 0.0–1.0)
Bilirubin, Direct: 0.1 mg/dL (ref 0.0–0.3)
CO2: 21 meq/L (ref 19–32)
Calcium: 9.1 mg/dL (ref 8.4–10.5)
Chloride: 105 meq/L (ref 96–112)
Creatinine, Ser: 1.9 mg/dL — ABNORMAL HIGH (ref 0.4–1.2)
Eosinophils Absolute: 0.3 10*3/uL (ref 0.0–0.7)
Eosinophils Relative: 2.7 % (ref 0.0–5.0)
GFR calc Af Amer: 35 mL/min
GFR calc non Af Amer: 29 mL/min
Glucose, Bld: 109 mg/dL — ABNORMAL HIGH (ref 70–99)
HCT: 32.8 % — ABNORMAL LOW (ref 36.0–46.0)
Hemoglobin: 11.5 g/dL — ABNORMAL LOW (ref 12.0–15.0)
Iron: 49 ug/dL (ref 42–145)
Lymphocytes Relative: 37 % (ref 12.0–46.0)
MCHC: 35 g/dL (ref 30.0–36.0)
MCV: 90.3 fL (ref 78.0–100.0)
Monocytes Absolute: 0.8 10*3/uL (ref 0.1–1.0)
Monocytes Relative: 8.2 % (ref 3.0–12.0)
Neutro Abs: 4.7 10*3/uL (ref 1.4–7.7)
Neutrophils Relative %: 51.4 % (ref 43.0–77.0)
Platelets: 497 10*3/uL — ABNORMAL HIGH (ref 150–400)
Potassium: 4.1 meq/L (ref 3.5–5.1)
RBC: 3.63 M/uL — ABNORMAL LOW (ref 3.87–5.11)
RDW: 12.7 % (ref 11.5–14.6)
Saturation Ratios: 18.7 % — ABNORMAL LOW (ref 20.0–50.0)
Sodium: 135 meq/L (ref 135–145)
Total Bilirubin: 0.7 mg/dL (ref 0.3–1.2)
Total Protein: 7.3 g/dL (ref 6.0–8.3)
Transferrin: 187.1 mg/dL — ABNORMAL LOW (ref >=212.0)
Vitamin B-12: 286 pg/mL (ref 211–911)
WBC: 9.4 10*3/uL (ref 4.5–10.5)

## 2008-02-06 ENCOUNTER — Ambulatory Visit (HOSPITAL_COMMUNITY): Payer: Self-pay | Admitting: Marriage and Family Therapist

## 2008-02-06 ENCOUNTER — Ambulatory Visit: Payer: Self-pay | Admitting: Internal Medicine

## 2008-02-08 ENCOUNTER — Telehealth: Payer: Self-pay | Admitting: Internal Medicine

## 2008-02-09 ENCOUNTER — Encounter: Payer: Self-pay | Admitting: Internal Medicine

## 2008-02-09 ENCOUNTER — Ambulatory Visit: Payer: Self-pay | Admitting: Internal Medicine

## 2008-02-13 ENCOUNTER — Encounter: Payer: Self-pay | Admitting: Internal Medicine

## 2008-02-15 ENCOUNTER — Telehealth: Payer: Self-pay | Admitting: Internal Medicine

## 2008-02-20 ENCOUNTER — Ambulatory Visit: Payer: Self-pay | Admitting: Family Medicine

## 2008-03-27 ENCOUNTER — Encounter: Payer: Self-pay | Admitting: Internal Medicine

## 2008-04-18 ENCOUNTER — Ambulatory Visit: Payer: Self-pay | Admitting: Family Medicine

## 2008-04-18 ENCOUNTER — Ambulatory Visit (HOSPITAL_COMMUNITY): Payer: Self-pay | Admitting: Marriage and Family Therapist

## 2008-04-18 DIAGNOSIS — M7710 Lateral epicondylitis, unspecified elbow: Secondary | ICD-10-CM | POA: Insufficient documentation

## 2008-04-18 DIAGNOSIS — B354 Tinea corporis: Secondary | ICD-10-CM | POA: Insufficient documentation

## 2008-04-18 LAB — CONVERTED CEMR LAB
Basophils Absolute: 0.1 10*3/uL (ref 0.0–0.1)
Basophils Relative: 0.7 % (ref 0.0–3.0)
Eosinophils Absolute: 0.3 10*3/uL (ref 0.0–0.7)
Eosinophils Relative: 3.5 % (ref 0.0–5.0)
HCT: 35.6 % — ABNORMAL LOW (ref 36.0–46.0)
Hemoglobin: 12.3 g/dL (ref 12.0–15.0)
Lymphocytes Relative: 34.7 % (ref 12.0–46.0)
MCHC: 34.7 g/dL (ref 30.0–36.0)
MCV: 93.4 fL (ref 78.0–100.0)
Monocytes Absolute: 0.5 10*3/uL (ref 0.1–1.0)
Monocytes Relative: 6.7 % (ref 3.0–12.0)
Neutro Abs: 4.3 10*3/uL (ref 1.4–7.7)
Neutrophils Relative %: 54.4 % (ref 43.0–77.0)
Platelets: 412 10*3/uL — ABNORMAL HIGH (ref 150–400)
RBC: 3.81 M/uL — ABNORMAL LOW (ref 3.87–5.11)
RDW: 12.9 % (ref 11.5–14.6)
WBC: 8 10*3/uL (ref 4.5–10.5)

## 2008-04-19 ENCOUNTER — Encounter (INDEPENDENT_AMBULATORY_CARE_PROVIDER_SITE_OTHER): Payer: Self-pay | Admitting: *Deleted

## 2008-05-27 ENCOUNTER — Ambulatory Visit: Payer: Self-pay | Admitting: Internal Medicine

## 2008-06-04 ENCOUNTER — Ambulatory Visit (HOSPITAL_COMMUNITY): Payer: Self-pay | Admitting: Psychiatry

## 2008-06-27 ENCOUNTER — Telehealth: Payer: Self-pay | Admitting: Internal Medicine

## 2008-06-29 ENCOUNTER — Emergency Department (HOSPITAL_COMMUNITY): Admission: EM | Admit: 2008-06-29 | Discharge: 2008-06-29 | Payer: Self-pay | Admitting: Emergency Medicine

## 2008-07-05 ENCOUNTER — Ambulatory Visit: Payer: Self-pay | Admitting: Family Medicine

## 2008-07-08 ENCOUNTER — Ambulatory Visit (HOSPITAL_COMMUNITY): Payer: Self-pay | Admitting: Marriage and Family Therapist

## 2008-07-23 ENCOUNTER — Ambulatory Visit: Payer: Self-pay | Admitting: Family Medicine

## 2008-08-16 ENCOUNTER — Encounter: Payer: Self-pay | Admitting: Gynecology

## 2008-08-16 ENCOUNTER — Other Ambulatory Visit: Admission: RE | Admit: 2008-08-16 | Discharge: 2008-08-16 | Payer: Self-pay | Admitting: Gynecology

## 2008-08-16 ENCOUNTER — Ambulatory Visit: Payer: Self-pay | Admitting: Gynecology

## 2008-08-16 ENCOUNTER — Encounter: Payer: Self-pay | Admitting: Internal Medicine

## 2008-08-22 ENCOUNTER — Ambulatory Visit: Payer: Self-pay | Admitting: Internal Medicine

## 2008-08-28 ENCOUNTER — Ambulatory Visit (HOSPITAL_COMMUNITY): Payer: Self-pay | Admitting: Marriage and Family Therapist

## 2008-09-02 ENCOUNTER — Telehealth: Payer: Self-pay | Admitting: Internal Medicine

## 2008-09-06 ENCOUNTER — Encounter: Payer: Self-pay | Admitting: Internal Medicine

## 2008-10-09 ENCOUNTER — Encounter: Payer: Self-pay | Admitting: Family Medicine

## 2008-11-13 ENCOUNTER — Encounter: Payer: Self-pay | Admitting: Internal Medicine

## 2008-11-13 ENCOUNTER — Encounter: Payer: Self-pay | Admitting: Family Medicine

## 2008-11-13 IMAGING — CR DG CHEST 2V
2 series · 2 of 2 positions shown · non-contrast
Comparison: [DATE] abdomen series

CLINICAL DATA: Anal mass and back soft tissue mass.  Preop
respiratory exam.  Asthma.

CHEST - 2 VIEW

[w chest pa]
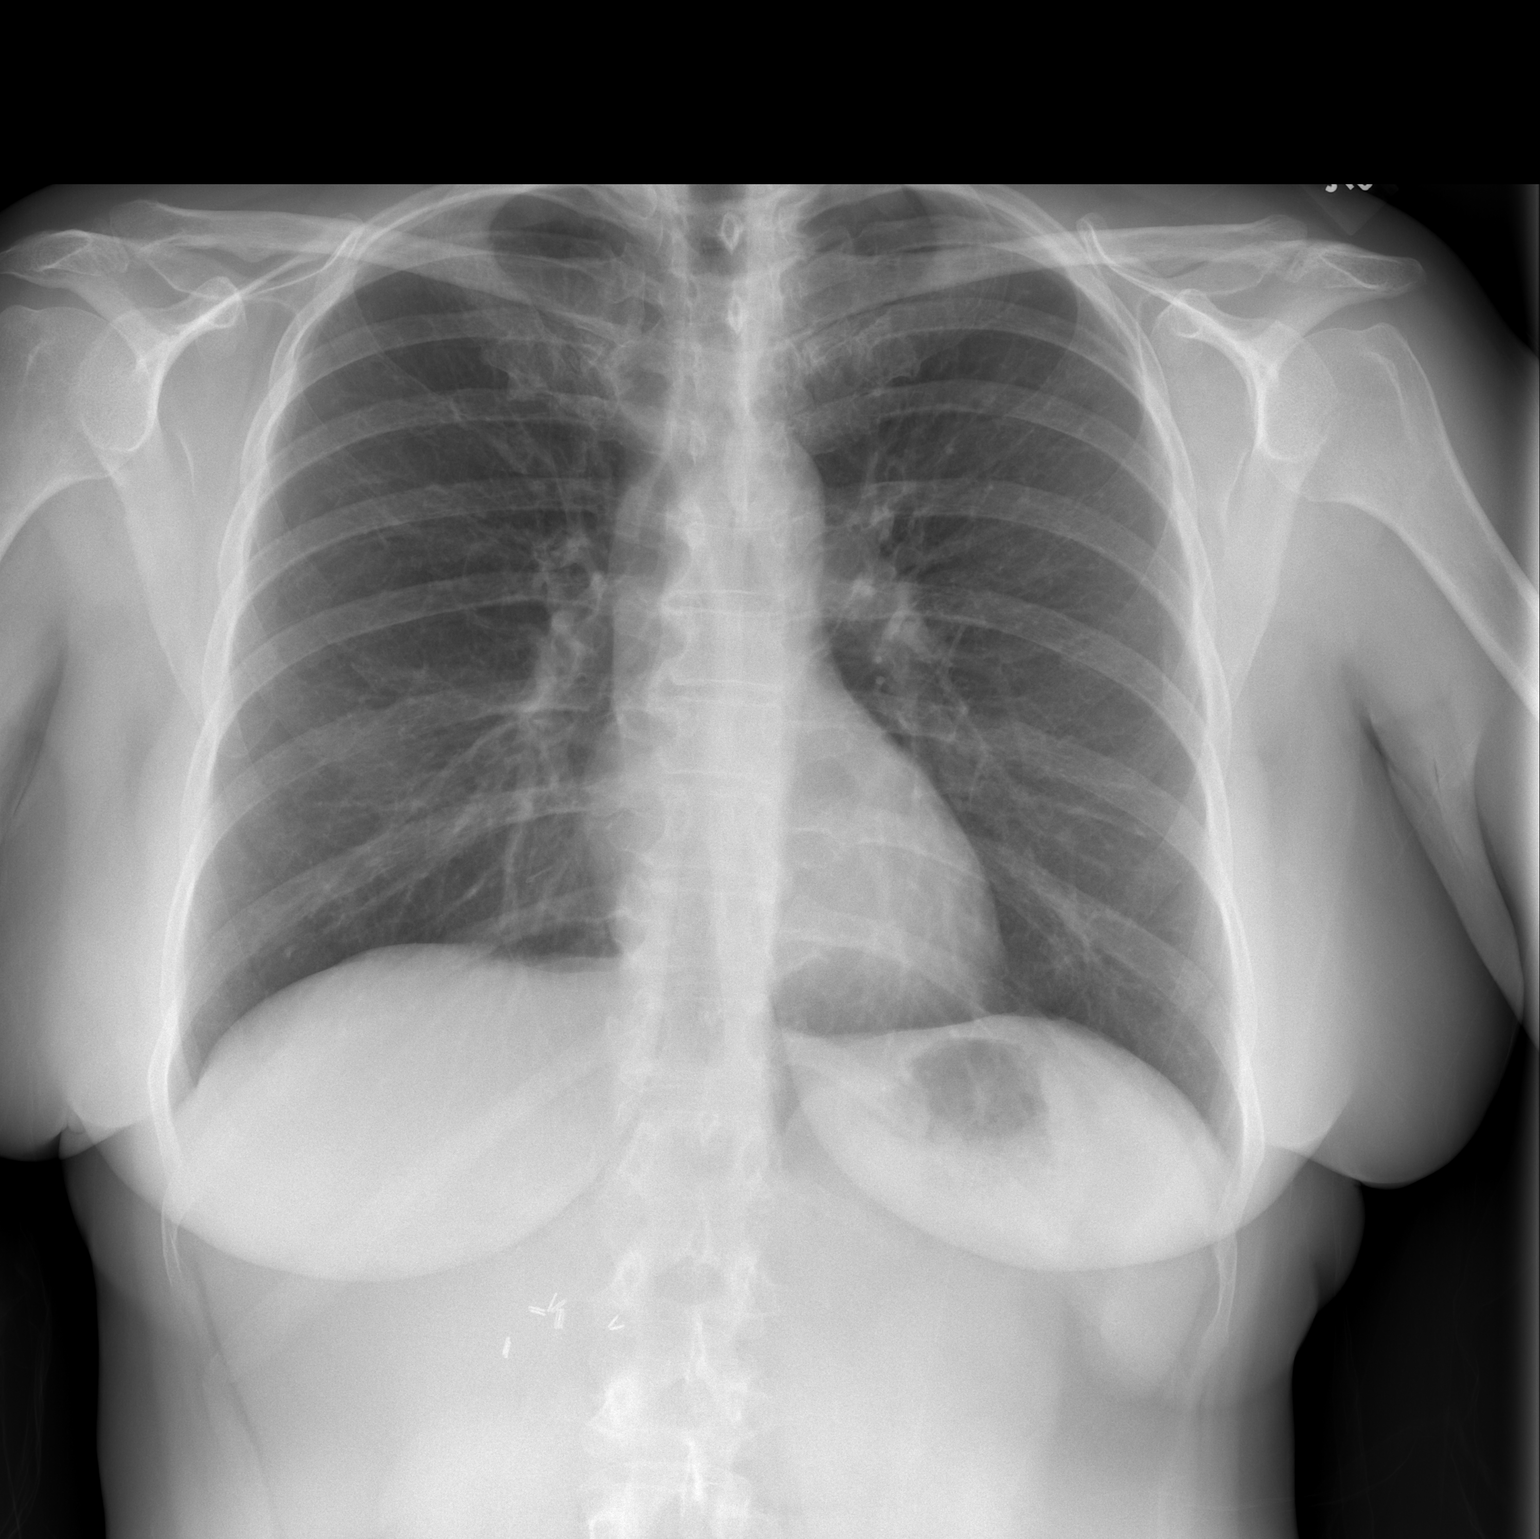

[w chest lat]
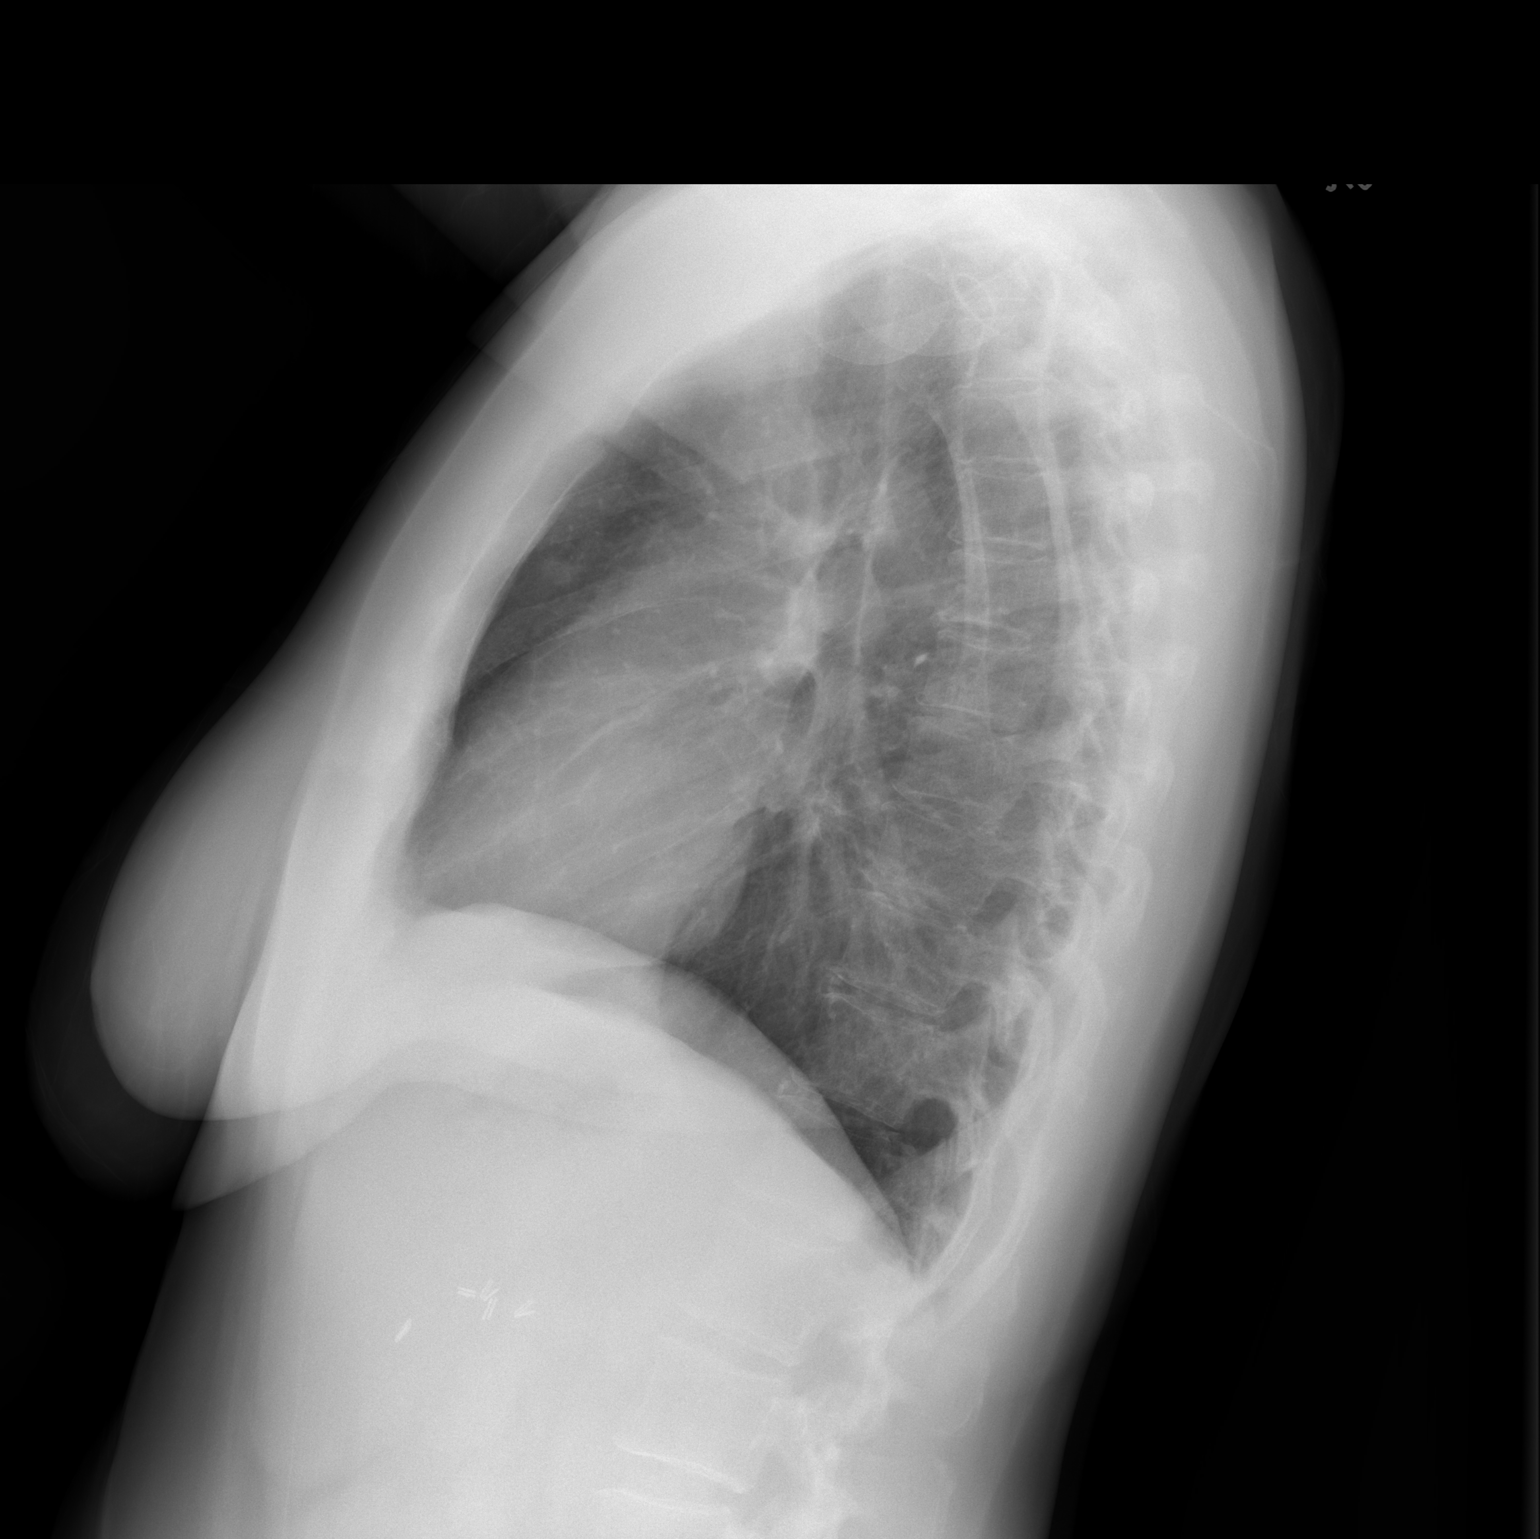

[2 of 2 positions shown; findings below may reference images not displayed]

FINDINGS: Heart size and mediastinal contours are normal.  There is
no evidence of pulmonary infiltrate or pleural effusion.

On the lateral projection, there is a 1 cm nodular opacity in the
retrocardiac lung zone.  No prior lateral chest radiograph
available for comparison.
IMPRESSION: 1 cm pulmonary nodular density seen in the anterior retrocardiac
lung zone on lateral projection.  Chest CT without contrast is
recommended for further evaluation.

This was made a call report to Dr. [REDACTED].

## 2008-11-15 ENCOUNTER — Ambulatory Visit (HOSPITAL_COMMUNITY): Admission: RE | Admit: 2008-11-15 | Discharge: 2008-11-15 | Payer: Self-pay | Admitting: General Surgery

## 2008-11-15 ENCOUNTER — Encounter (INDEPENDENT_AMBULATORY_CARE_PROVIDER_SITE_OTHER): Payer: Self-pay | Admitting: General Surgery

## 2008-11-15 ENCOUNTER — Encounter (INDEPENDENT_AMBULATORY_CARE_PROVIDER_SITE_OTHER): Payer: Self-pay | Admitting: *Deleted

## 2008-11-18 ENCOUNTER — Encounter: Admission: RE | Admit: 2008-11-18 | Discharge: 2008-11-18 | Payer: Self-pay | Admitting: General Surgery

## 2008-11-18 ENCOUNTER — Encounter: Payer: Self-pay | Admitting: Family Medicine

## 2008-11-18 IMAGING — CT CT CHEST W/O CM
2 of 3 series · 15 of 36 positions shown, 18 images · non-contrast
Comparison: Chest radiograph on [DATE]

CLINICAL DATA: Anal soft tissue mass.  Pulmonary nodule seen on
chest radiograph.

CT CHEST WITHOUT CONTRAST
TECHNIQUE: Multidetector CT imaging of the chest was performed
following the standard protocol without IV contrast.

[Series 3: routine chest · axial · 0.70mm/px · z∈[-293,-33]mm · 12 of 62 slices shown, 15 images]
[im 5/62  mediastinal]
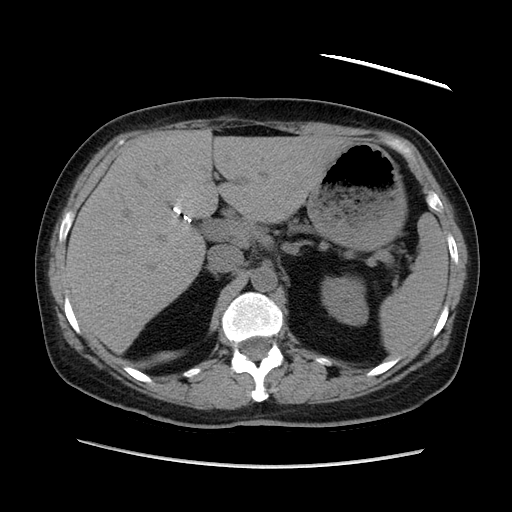
[im 5/62  lung]
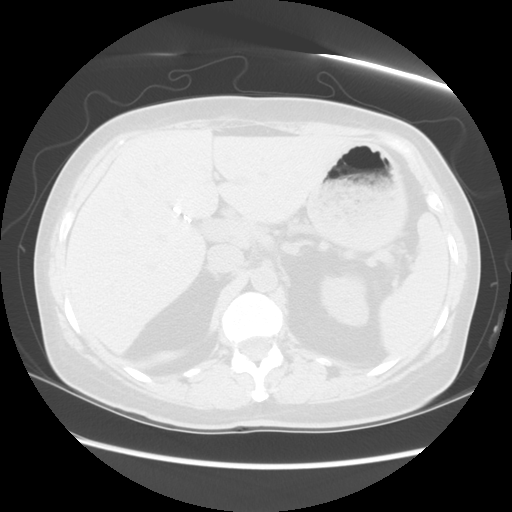
[im 10/62  lung]
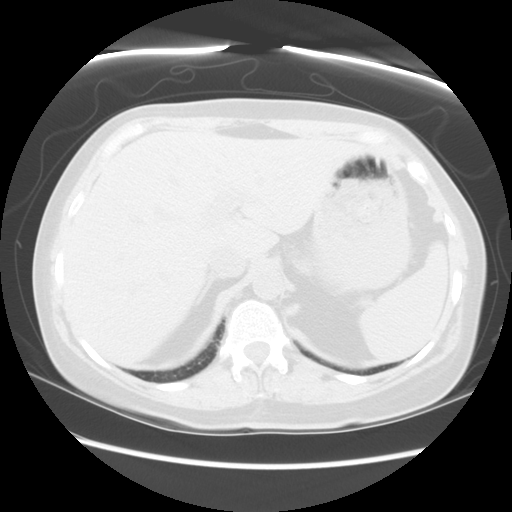
[im 14/62  lung]
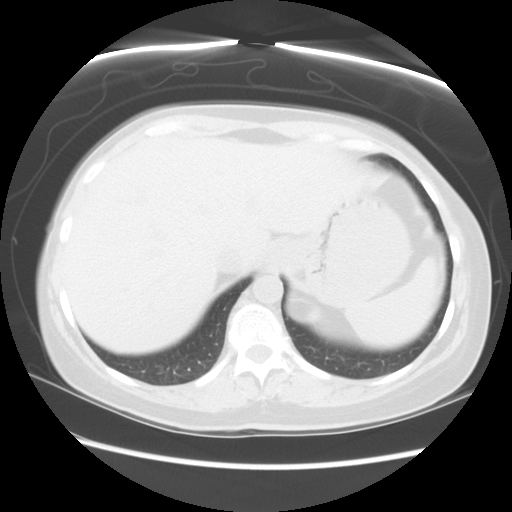
[im 19/62  lung]
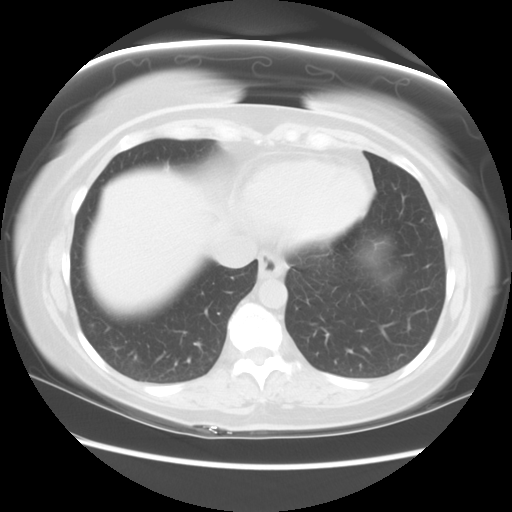
[im 23/62  mediastinal]
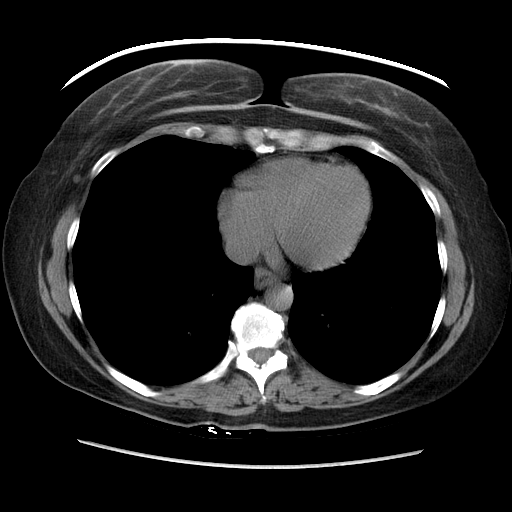
[im 23/62  lung]
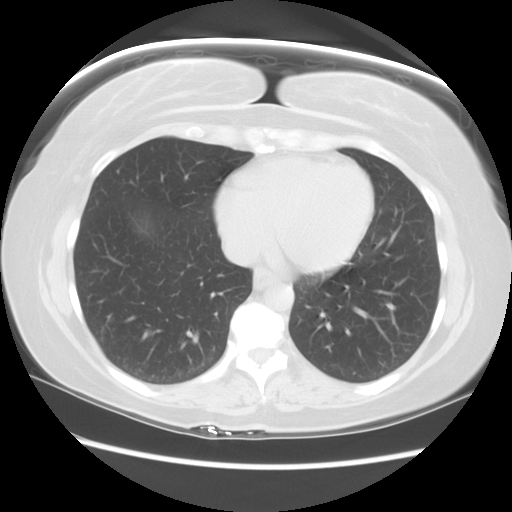
[im 28/62  lung]
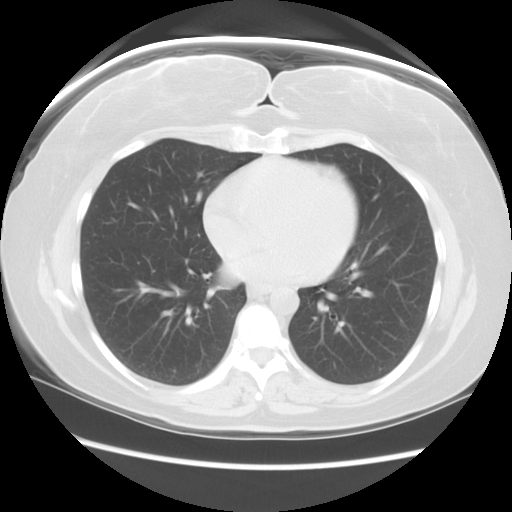
[im 34/62  lung]
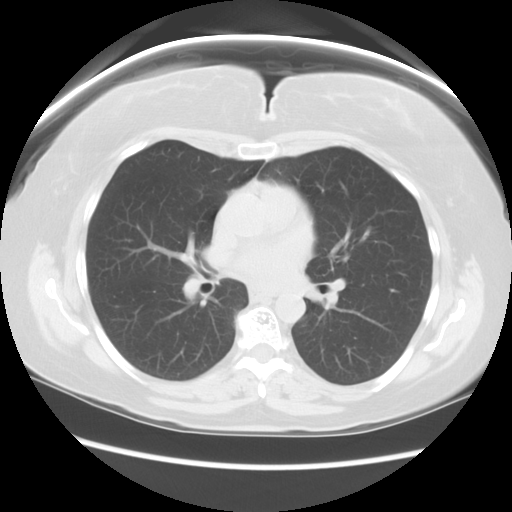
[im 39/62  lung]
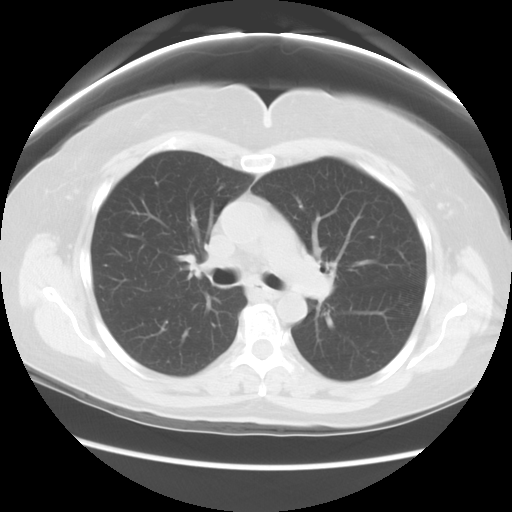
[im 43/62  mediastinal]
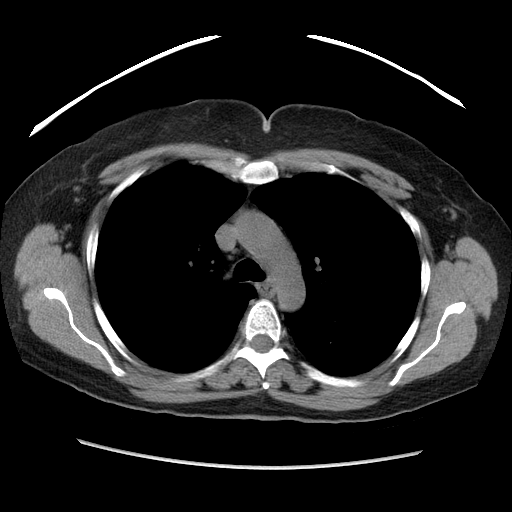
[im 43/62  lung]
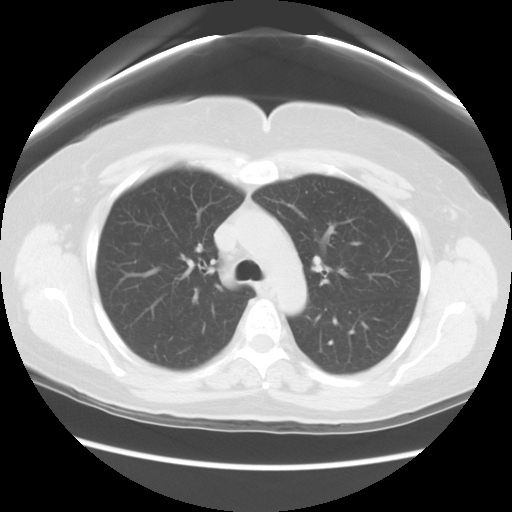
[im 48/62  lung]
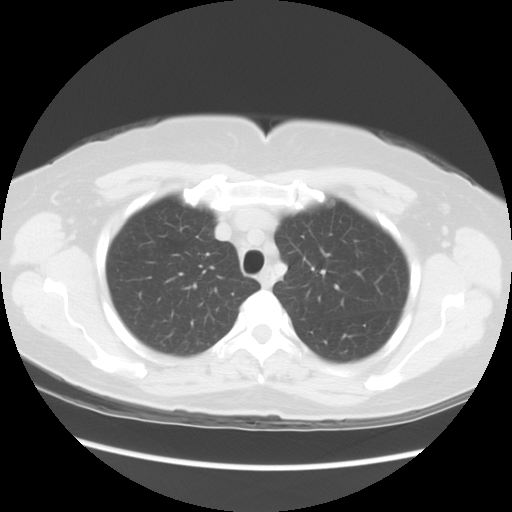
[im 52/62  lung]
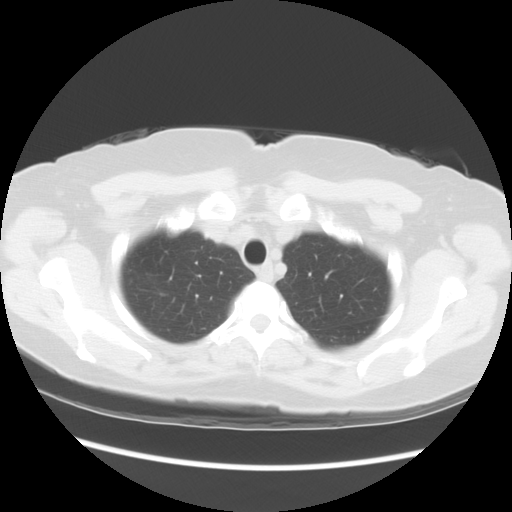
[im 57/62  lung]
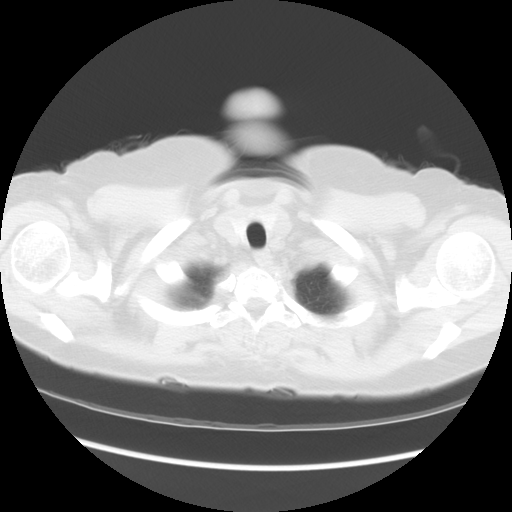

[Series 602: sagittal body · sagittal · 0.70mm/px · 3 of 145 slices shown]
[im 29/145  lung]
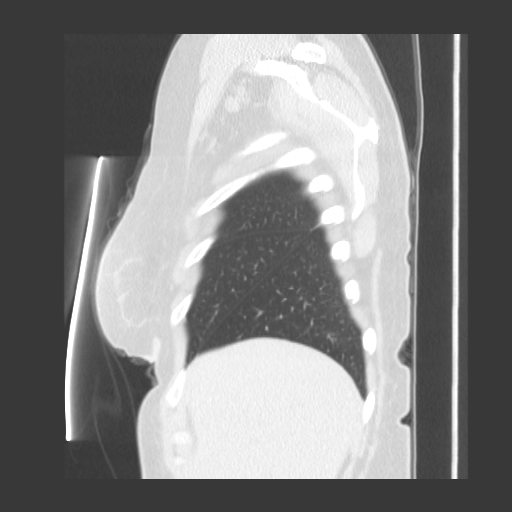
[im 58/145  lung]
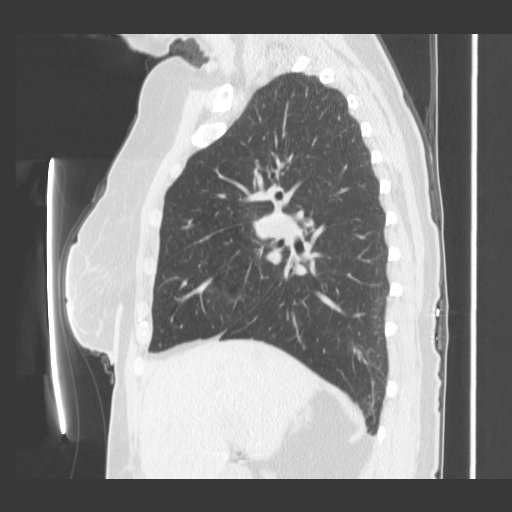
[im 87/145  lung]
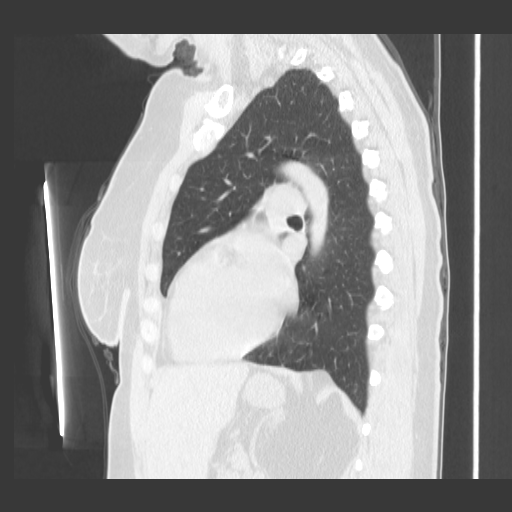

[15 of 36 positions shown; findings below may reference images not displayed]

FINDINGS: A noncalcified nodule is seen in the antero-medial aspect
of the right upper lobe.  This nodule measures 7 x 8 mm and shows a
well circumscribed margins, correlates with the nodules seen on
recent chest radiograph.  No other suspicious pulmonary nodules or
masses are identified.

There is no evidence of pulmonary infiltrate.  No pleural or
pericardial effusion identified.  There is no evidence of hilar or
mediastinal masses.  No adenopathy is seen elsewhere within the
thorax.  No suspicious bone lesions are identified.
IMPRESSION: 1.  Indeterminate 8 mm noncalcified nodule in the right upper lobe.
If the patient is at high risk for bronchogenic carcinoma, follow-
up chest CT at 3-6 months is recommended.  If the patient is at low
risk for bronchogenic carcinoma, follow-up chest CT at 6-12 months
is recommended.  This recommendation follows the consensus
statement: "Guidelines for Management of Small Pulmonary Nodules
Detected on CT Scans: A Statement from the [HOSPITAL]" as
[URL]
2.  No evidence of lymphadenopathy or pleural effusion.

## 2008-11-20 ENCOUNTER — Encounter: Payer: Self-pay | Admitting: Internal Medicine

## 2008-11-27 ENCOUNTER — Encounter: Payer: Self-pay | Admitting: Internal Medicine

## 2008-12-12 ENCOUNTER — Encounter: Payer: Self-pay | Admitting: Family Medicine

## 2008-12-12 ENCOUNTER — Telehealth: Payer: Self-pay | Admitting: Family Medicine

## 2008-12-16 ENCOUNTER — Ambulatory Visit: Payer: Self-pay | Admitting: Family Medicine

## 2008-12-16 DIAGNOSIS — M25559 Pain in unspecified hip: Secondary | ICD-10-CM | POA: Insufficient documentation

## 2008-12-16 DIAGNOSIS — D1800 Hemangioma unspecified site: Secondary | ICD-10-CM | POA: Insufficient documentation

## 2008-12-16 DIAGNOSIS — R21 Rash and other nonspecific skin eruption: Secondary | ICD-10-CM | POA: Insufficient documentation

## 2008-12-16 HISTORY — DX: Pain in unspecified hip: M25.559

## 2008-12-18 ENCOUNTER — Encounter: Payer: Self-pay | Admitting: Family Medicine

## 2008-12-23 ENCOUNTER — Encounter: Payer: Self-pay | Admitting: Family Medicine

## 2008-12-23 DIAGNOSIS — R930 Abnormal findings on diagnostic imaging of skull and head, not elsewhere classified: Secondary | ICD-10-CM | POA: Insufficient documentation

## 2009-01-09 ENCOUNTER — Telehealth: Payer: Self-pay | Admitting: Internal Medicine

## 2009-01-13 ENCOUNTER — Ambulatory Visit: Payer: Self-pay | Admitting: Internal Medicine

## 2009-01-14 LAB — CONVERTED CEMR LAB
Basophils Absolute: 0.1 10*3/uL (ref 0.0–0.1)
Basophils Relative: 1 % (ref 0.0–3.0)
Eosinophils Absolute: 0.3 10*3/uL (ref 0.0–0.7)
Eosinophils Relative: 3 % (ref 0.0–5.0)
HCT: 36.4 % (ref 36.0–46.0)
Hemoglobin: 12.8 g/dL (ref 12.0–15.0)
Iron: 91 ug/dL (ref 42–145)
Lymphocytes Relative: 34.5 % (ref 12.0–46.0)
MCHC: 35.1 g/dL (ref 30.0–36.0)
MCV: 92.9 fL (ref 78.0–100.0)
Monocytes Absolute: 0.9 10*3/uL (ref 0.1–1.0)
Monocytes Relative: 8.3 % (ref 3.0–12.0)
Neutro Abs: 5.8 10*3/uL (ref 1.4–7.7)
Neutrophils Relative %: 53.2 % (ref 43.0–77.0)
Platelets: 388 10*3/uL (ref 150–400)
RBC: 3.91 M/uL (ref 3.87–5.11)
RDW: 13.3 % (ref 11.5–14.6)
Saturation Ratios: 33.8 % (ref 20.0–50.0)
Transferrin: 192.5 mg/dL — ABNORMAL LOW (ref >=212.0)
WBC: 10.9 10*3/uL — ABNORMAL HIGH (ref 4.5–10.5)

## 2009-01-15 LAB — CONVERTED CEMR LAB
Basophils Absolute: 0 10*3/uL (ref 0.0–0.1)
Basophils Relative: 0.5 % (ref 0.0–3.0)
Eosinophils Absolute: 0.3 10*3/uL (ref 0.0–0.7)
Eosinophils Relative: 3.5 % (ref 0.0–5.0)
HCT: 34.6 % — ABNORMAL LOW (ref 36.0–46.0)
Hemoglobin: 11.8 g/dL — ABNORMAL LOW (ref 12.0–15.0)
Iron: 40 ug/dL — ABNORMAL LOW (ref 42–145)
Lymphocytes Relative: 32.7 % (ref 12.0–46.0)
Lymphs Abs: 2.7 10*3/uL (ref 0.7–4.0)
MCHC: 34.1 g/dL (ref 30.0–36.0)
MCV: 94.3 fL (ref 78.0–100.0)
Monocytes Absolute: 0.6 10*3/uL (ref 0.1–1.0)
Monocytes Relative: 6.7 % (ref 3.0–12.0)
Neutro Abs: 4.7 10*3/uL (ref 1.4–7.7)
Neutrophils Relative %: 56.6 % (ref 43.0–77.0)
Platelets: 332 10*3/uL (ref 150.0–400.0)
RBC: 3.66 M/uL — ABNORMAL LOW (ref 3.87–5.11)
RDW: 13 % (ref 11.5–14.6)
Saturation Ratios: 13.7 % — ABNORMAL LOW (ref 20.0–50.0)
TSH: 2.73 microintl units/mL (ref 0.35–5.50)
Transferrin: 209.3 mg/dL — ABNORMAL LOW (ref 212.0–360.0)
WBC: 8.3 10*3/uL (ref 4.5–10.5)

## 2009-02-04 ENCOUNTER — Ambulatory Visit: Payer: Self-pay | Admitting: Family Medicine

## 2009-02-04 DIAGNOSIS — M2550 Pain in unspecified joint: Secondary | ICD-10-CM | POA: Insufficient documentation

## 2009-02-05 LAB — CONVERTED CEMR LAB: Anti Nuclear Antibody(ANA): NEGATIVE

## 2009-02-06 ENCOUNTER — Encounter (INDEPENDENT_AMBULATORY_CARE_PROVIDER_SITE_OTHER): Payer: Self-pay | Admitting: *Deleted

## 2009-02-11 ENCOUNTER — Ambulatory Visit: Payer: Self-pay | Admitting: Cardiology

## 2009-02-11 ENCOUNTER — Encounter: Payer: Self-pay | Admitting: Family Medicine

## 2009-02-11 DIAGNOSIS — Z87891 Personal history of nicotine dependence: Secondary | ICD-10-CM | POA: Insufficient documentation

## 2009-02-11 IMAGING — CT CT CHEST W/O CM
2 of 6 series · 9 of 36 positions shown, 11 images · non-contrast
Comparison: [DATE]

CLINICAL DATA: Right upper lobe lung nodule on prior CT.  Quit
smoking approximately 30 years ago.

CT CHEST WITHOUT CONTRAST
TECHNIQUE: Multidetector CT imaging of the chest was performed
following the standard protocol without IV contrast.  Dedicated
high resolution images were also performed.

[Series 6: high res lung 1x5 · axial · 0.66mm/px · z∈[-282,-62]mm · 6 of 32 slices shown, 8 images]
[im 5/32  mediastinal]
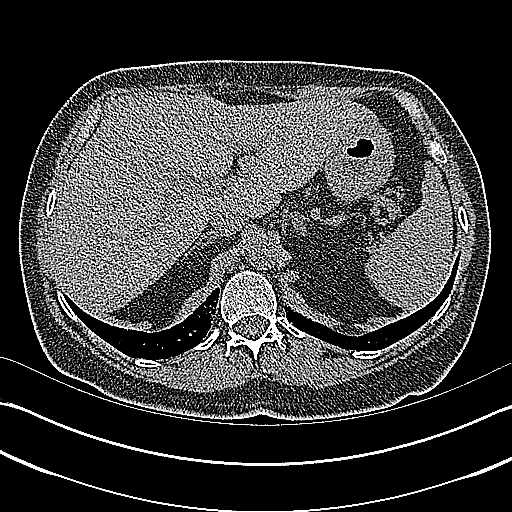
[im 5/32  lung]
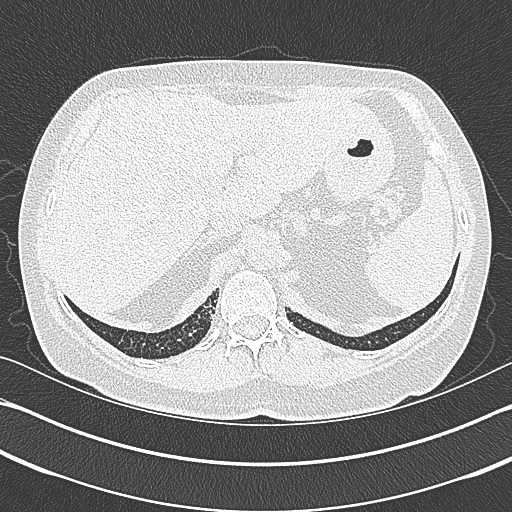
[im 9/32  lung]
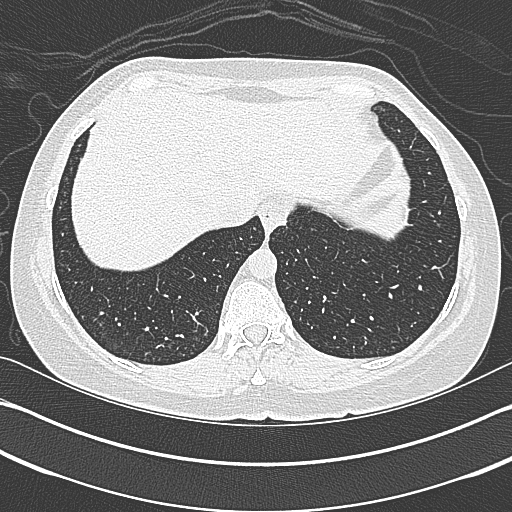
[im 14/32  lung]
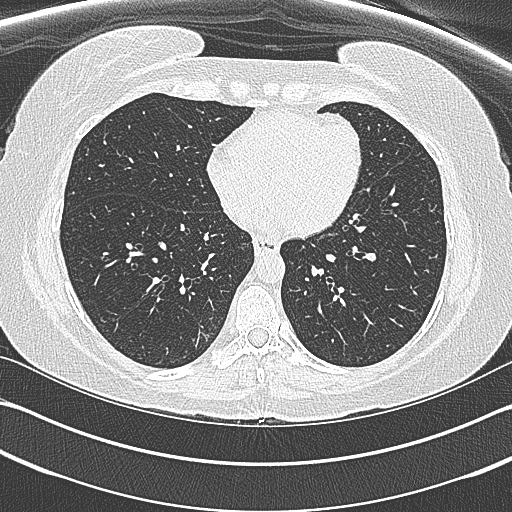
[im 18/32  lung]
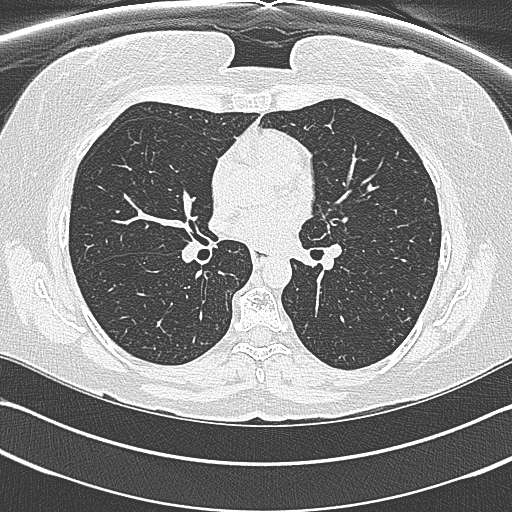
[im 23/32  mediastinal]
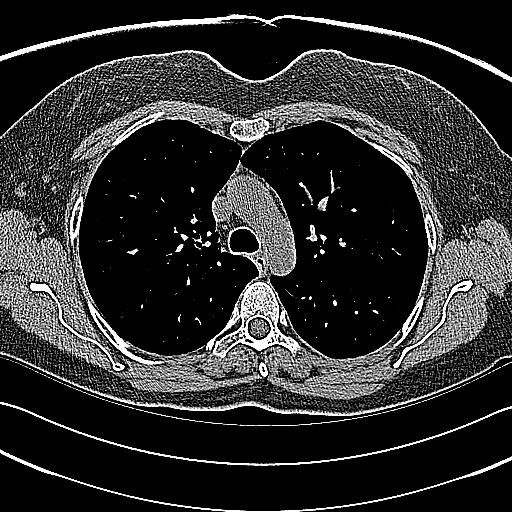
[im 23/32  lung]
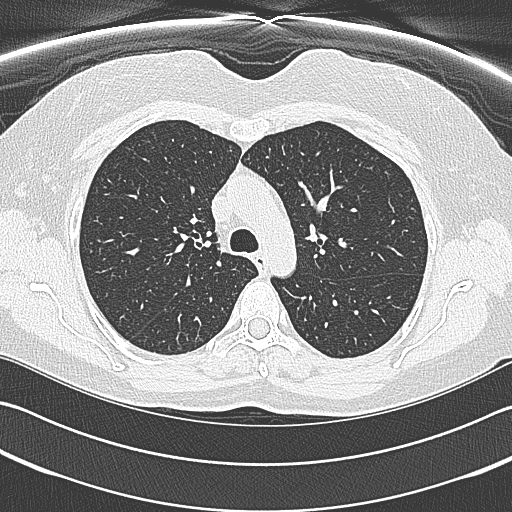
[im 27/32  lung]
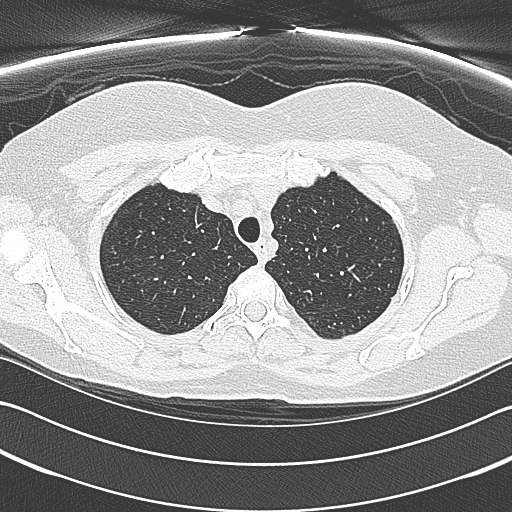

[Series 602: <mpr thick range> · coronal · 0.66mm/px · 3 of 131 slices shown]
[im 27/131  lung]
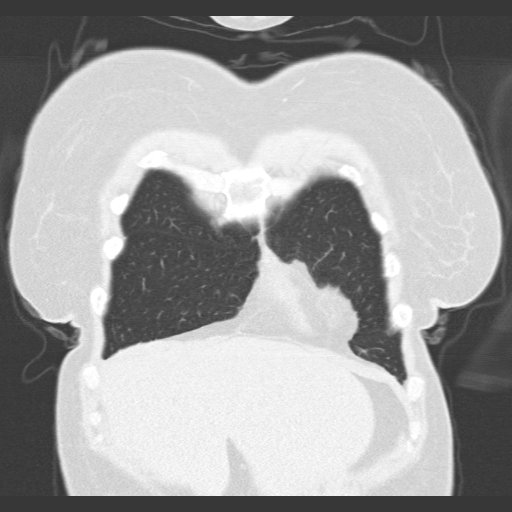
[im 53/131  lung]
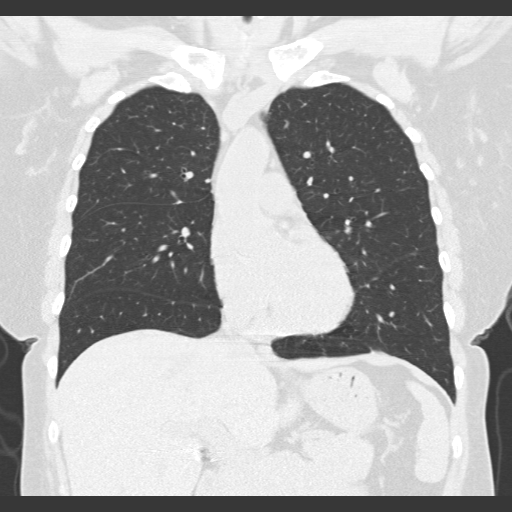
[im 79/131  lung]
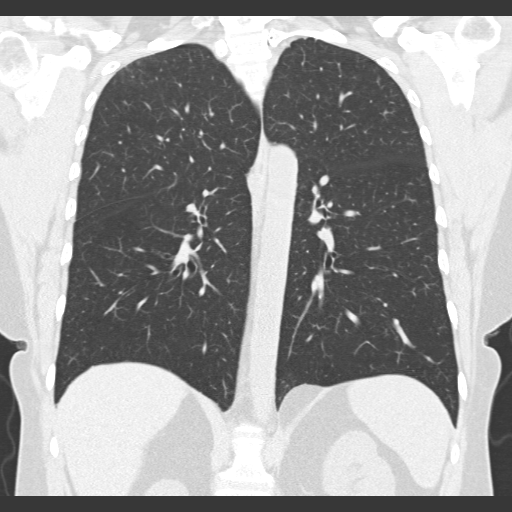

[9 of 36 positions shown; findings below may reference images not displayed]

FINDINGS: Lung windows demonstrate right upper lobe lung nodule
measures 7 x 6 mm on image 24.  Felt to be similar 8 x 7 mm on the
prior exam. Likely calcified 2mm right upper lobe lung nodule on
image 18.

Soft tissue windows demonstrate normal heart size without
pericardial or pleural effusion.  No mediastinal or definite hilar
adenopathy, given limitations of unenhanced CT.

Limited abdominal imaging demonstrates lateral segment left liver
lobe cyst.  Cholecystectomy.  Mid thoracic hemangioma.

High resolution images demonstrate no significant air trapping.  No
evidence of interstitial lung disease.
IMPRESSION: 1.  Stable 7-8 mm right upper lobe lung nodule.  If the the patient
is at high risk for bronchogenic carcinoma a follow-up at 6-9
months is recommended.  If the the patient is at low risk for
bronchogenic carcinoma, a follow-up at approximately 1 year is
recommended. This recommendation follows the consensus statement:
"Guidelines for Management of Small Pulmonary Nodules Detected on
CT Scans:  A Statement from the [HOSPITAL]" as published in
[URL]
2. No acute process in the chest.

## 2009-02-12 ENCOUNTER — Telehealth (INDEPENDENT_AMBULATORY_CARE_PROVIDER_SITE_OTHER): Payer: Self-pay | Admitting: *Deleted

## 2009-02-16 LAB — CONVERTED CEMR LAB
BUN: 15 mg/dL (ref 6–23)
Basophils Absolute: 0 10*3/uL (ref 0.0–0.1)
Basophils Relative: 0.4 % (ref 0.0–3.0)
CO2: 26 meq/L (ref 19–32)
Calcium: 9.1 mg/dL (ref 8.4–10.5)
Chloride: 110 meq/L (ref 96–112)
Creatinine, Ser: 1.6 mg/dL — ABNORMAL HIGH (ref 0.4–1.2)
Eosinophils Absolute: 0.2 10*3/uL (ref 0.0–0.7)
Eosinophils Relative: 2.4 % (ref 0.0–5.0)
GFR calc non Af Amer: 35.55 mL/min (ref >60)
Glucose, Bld: 78 mg/dL (ref 70–99)
HCT: 34 % — ABNORMAL LOW (ref 36.0–46.0)
Hemoglobin: 11.8 g/dL — ABNORMAL LOW (ref 12.0–15.0)
Lymphocytes Relative: 31.1 % (ref 12.0–46.0)
Lymphs Abs: 2.4 10*3/uL (ref 0.7–4.0)
MCHC: 34.8 g/dL (ref 30.0–36.0)
MCV: 92.9 fL (ref 78.0–100.0)
Monocytes Absolute: 0.5 10*3/uL (ref 0.1–1.0)
Monocytes Relative: 7 % (ref 3.0–12.0)
Neutro Abs: 4.5 10*3/uL (ref 1.4–7.7)
Neutrophils Relative %: 59.1 % (ref 43.0–77.0)
Platelets: 412 10*3/uL — ABNORMAL HIGH (ref 150.0–400.0)
Potassium: 3.7 meq/L (ref 3.5–5.1)
RBC: 3.66 M/uL — ABNORMAL LOW (ref 3.87–5.11)
RDW: 12.7 % (ref 11.5–14.6)
Rheumatoid fact SerPl-aCnc: 20 intl units/mL (ref 0.0–20.0)
Sed Rate: 65 mm/hr — ABNORMAL HIGH (ref 0–22)
Sodium: 139 meq/L (ref 135–145)
TSH: 2.2 microintl units/mL (ref 0.35–5.50)
WBC: 7.6 10*3/uL (ref 4.5–10.5)

## 2009-02-17 ENCOUNTER — Encounter (INDEPENDENT_AMBULATORY_CARE_PROVIDER_SITE_OTHER): Payer: Self-pay | Admitting: *Deleted

## 2009-02-25 ENCOUNTER — Ambulatory Visit (HOSPITAL_COMMUNITY): Payer: Self-pay | Admitting: Psychiatry

## 2009-02-28 ENCOUNTER — Ambulatory Visit: Payer: Self-pay | Admitting: Pulmonary Disease

## 2009-02-28 DIAGNOSIS — J984 Other disorders of lung: Secondary | ICD-10-CM | POA: Insufficient documentation

## 2009-03-14 ENCOUNTER — Ambulatory Visit: Payer: Self-pay | Admitting: Internal Medicine

## 2009-03-14 LAB — CONVERTED CEMR LAB
Calcium, Total (PTH): 8.8 mg/dL (ref 8.4–10.5)
PTH: 116.7 pg/mL — ABNORMAL HIGH (ref 14.0–72.0)

## 2009-03-17 ENCOUNTER — Telehealth: Payer: Self-pay | Admitting: Internal Medicine

## 2009-03-17 LAB — CONVERTED CEMR LAB
Albumin: 3.2 g/dL — ABNORMAL LOW (ref 3.5–5.2)
BUN: 15 mg/dL (ref 6–23)
Basophils Absolute: 0.1 10*3/uL (ref 0.0–0.1)
Basophils Relative: 0.7 % (ref 0.0–3.0)
Bilirubin Urine: NEGATIVE
CO2: 28 meq/L (ref 19–32)
Calcium: 8.7 mg/dL (ref 8.4–10.5)
Chloride: 104 meq/L (ref 96–112)
Creatinine, Ser: 1.5 mg/dL — ABNORMAL HIGH (ref 0.4–1.2)
Eosinophils Absolute: 0.2 10*3/uL (ref 0.0–0.7)
Eosinophils Relative: 2.5 % (ref 0.0–5.0)
Ferritin: 260.8 ng/mL (ref 10.0–291.0)
Glucose, Bld: 87 mg/dL (ref 70–99)
HCT: 32 % — ABNORMAL LOW (ref 36.0–46.0)
Hemoglobin, Urine: NEGATIVE
Hemoglobin: 10.9 g/dL — ABNORMAL LOW (ref 12.0–15.0)
Iron: 36 ug/dL — ABNORMAL LOW (ref 42–145)
Ketones, ur: NEGATIVE mg/dL
Leukocytes, UA: NEGATIVE
Lymphocytes Relative: 33.8 % (ref 12.0–46.0)
Lymphs Abs: 2.6 10*3/uL (ref 0.7–4.0)
MCHC: 34.1 g/dL (ref 30.0–36.0)
MCV: 92.8 fL (ref 78.0–100.0)
Monocytes Absolute: 0.6 10*3/uL (ref 0.1–1.0)
Monocytes Relative: 8.2 % (ref 3.0–12.0)
Neutro Abs: 4.1 10*3/uL (ref 1.4–7.7)
Neutrophils Relative %: 54.8 % (ref 43.0–77.0)
Nitrite: NEGATIVE
Phosphorus: 4.8 mg/dL — ABNORMAL HIGH (ref 2.3–4.6)
Platelets: 366 10*3/uL (ref 150.0–400.0)
Potassium: 4 meq/L (ref 3.5–5.1)
RBC: 3.45 M/uL — ABNORMAL LOW (ref 3.87–5.11)
RDW: 12.8 % (ref 11.5–14.6)
Saturation Ratios: 13.7 % — ABNORMAL LOW (ref 20.0–50.0)
Sodium: 137 meq/L (ref 135–145)
Specific Gravity, Urine: 1.01 (ref 1.000–1.030)
Total Protein, Urine: NEGATIVE mg/dL
Transferrin: 187.3 mg/dL — ABNORMAL LOW (ref 212.0–360.0)
Urine Glucose: NEGATIVE mg/dL
Urobilinogen, UA: 0.2 (ref 0.0–1.0)
WBC: 7.6 10*3/uL (ref 4.5–10.5)
pH: 5.5 (ref 5.0–8.0)

## 2009-03-24 ENCOUNTER — Encounter: Payer: Self-pay | Admitting: Internal Medicine

## 2009-03-25 ENCOUNTER — Ambulatory Visit: Payer: Self-pay | Admitting: Family Medicine

## 2009-03-26 ENCOUNTER — Encounter (HOSPITAL_COMMUNITY): Admission: RE | Admit: 2009-03-26 | Discharge: 2009-06-05 | Payer: Self-pay | Admitting: Internal Medicine

## 2009-03-26 ENCOUNTER — Encounter: Payer: Self-pay | Admitting: Internal Medicine

## 2009-04-01 ENCOUNTER — Ambulatory Visit (HOSPITAL_COMMUNITY): Payer: Self-pay | Admitting: Marriage and Family Therapist

## 2009-04-23 ENCOUNTER — Ambulatory Visit: Payer: Self-pay | Admitting: Internal Medicine

## 2009-04-24 LAB — CONVERTED CEMR LAB
Basophils Absolute: 0.1 10*3/uL (ref 0.0–0.1)
Basophils Relative: 0.7 % (ref 0.0–3.0)
Eosinophils Absolute: 0.3 10*3/uL (ref 0.0–0.7)
Eosinophils Relative: 2.5 % (ref 0.0–5.0)
HCT: 34 % — ABNORMAL LOW (ref 36.0–46.0)
Hemoglobin: 11.6 g/dL — ABNORMAL LOW (ref 12.0–15.0)
Iron: 46 ug/dL (ref 42–145)
Lymphocytes Relative: 35 % (ref 12.0–46.0)
Lymphs Abs: 3.6 10*3/uL (ref 0.7–4.0)
MCHC: 34.1 g/dL (ref 30.0–36.0)
MCV: 94.4 fL (ref 78.0–100.0)
Monocytes Absolute: 0.7 10*3/uL (ref 0.1–1.0)
Monocytes Relative: 7.1 % (ref 3.0–12.0)
Neutro Abs: 5.6 10*3/uL (ref 1.4–7.7)
Neutrophils Relative %: 54.7 % (ref 43.0–77.0)
Platelets: 421 10*3/uL — ABNORMAL HIGH (ref 150.0–400.0)
RBC: 3.6 M/uL — ABNORMAL LOW (ref 3.87–5.11)
RDW: 13.7 % (ref 11.5–14.6)
Saturation Ratios: 17.1 % — ABNORMAL LOW (ref 20.0–50.0)
Transferrin: 192.7 mg/dL — ABNORMAL LOW (ref 212.0–360.0)
WBC: 10.3 10*3/uL (ref 4.5–10.5)

## 2009-05-28 ENCOUNTER — Encounter: Payer: Self-pay | Admitting: Internal Medicine

## 2009-06-04 ENCOUNTER — Encounter: Payer: Self-pay | Admitting: Internal Medicine

## 2009-06-18 ENCOUNTER — Ambulatory Visit: Payer: Self-pay | Admitting: Internal Medicine

## 2009-06-18 ENCOUNTER — Ambulatory Visit (HOSPITAL_COMMUNITY): Payer: Self-pay | Admitting: Marriage and Family Therapist

## 2009-06-19 LAB — CONVERTED CEMR LAB
Basophils Absolute: 0.1 10*3/uL (ref 0.0–0.1)
Basophils Relative: 1.5 % (ref 0.0–3.0)
Eosinophils Absolute: 0.2 10*3/uL (ref 0.0–0.7)
Eosinophils Relative: 2.9 % (ref 0.0–5.0)
HCT: 34.2 % — ABNORMAL LOW (ref 36.0–46.0)
Hemoglobin: 12.1 g/dL (ref 12.0–15.0)
Lymphocytes Relative: 32.6 % (ref 12.0–46.0)
Lymphs Abs: 2.5 10*3/uL (ref 0.7–4.0)
MCHC: 35.4 g/dL (ref 30.0–36.0)
MCV: 93.1 fL (ref 78.0–100.0)
Monocytes Absolute: 0.7 10*3/uL (ref 0.1–1.0)
Monocytes Relative: 8.6 % (ref 3.0–12.0)
Neutro Abs: 4.2 10*3/uL (ref 1.4–7.7)
Neutrophils Relative %: 54.4 % (ref 43.0–77.0)
Platelets: 345 10*3/uL (ref 150.0–400.0)
RBC: 3.68 M/uL — ABNORMAL LOW (ref 3.87–5.11)
RDW: 12.9 % (ref 11.5–14.6)
WBC: 7.7 10*3/uL (ref 4.5–10.5)

## 2009-07-02 ENCOUNTER — Encounter: Payer: Self-pay | Admitting: Internal Medicine

## 2009-07-08 ENCOUNTER — Encounter: Payer: Self-pay | Admitting: Internal Medicine

## 2009-07-29 ENCOUNTER — Ambulatory Visit: Payer: Self-pay | Admitting: Family Medicine

## 2009-08-01 ENCOUNTER — Telehealth: Payer: Self-pay | Admitting: Internal Medicine

## 2009-08-05 ENCOUNTER — Ambulatory Visit: Payer: Self-pay | Admitting: Family Medicine

## 2009-08-05 DIAGNOSIS — G2581 Restless legs syndrome: Secondary | ICD-10-CM | POA: Insufficient documentation

## 2009-08-26 ENCOUNTER — Encounter: Payer: Self-pay | Admitting: Internal Medicine

## 2009-09-04 ENCOUNTER — Telehealth (INDEPENDENT_AMBULATORY_CARE_PROVIDER_SITE_OTHER): Payer: Self-pay | Admitting: *Deleted

## 2009-09-04 ENCOUNTER — Telehealth: Payer: Self-pay | Admitting: Internal Medicine

## 2009-09-08 ENCOUNTER — Telehealth: Payer: Self-pay | Admitting: Internal Medicine

## 2009-09-08 ENCOUNTER — Encounter: Payer: Self-pay | Admitting: Internal Medicine

## 2009-09-10 ENCOUNTER — Ambulatory Visit: Payer: Self-pay | Admitting: Internal Medicine

## 2009-09-11 ENCOUNTER — Telehealth: Payer: Self-pay | Admitting: Internal Medicine

## 2009-09-11 LAB — CONVERTED CEMR LAB
ALT: 16 units/L (ref 0–35)
AST: 19 units/L (ref 0–37)
Albumin: 3.3 g/dL — ABNORMAL LOW (ref 3.5–5.2)
Alkaline Phosphatase: 60 units/L (ref 39–117)
BUN: 21 mg/dL (ref 6–23)
Basophils Absolute: 0.1 10*3/uL (ref 0.0–0.1)
Basophils Relative: 0.9 % (ref 0.0–3.0)
CO2: 22 meq/L (ref 19–32)
Calcium: 9.2 mg/dL (ref 8.4–10.5)
Chloride: 110 meq/L (ref 96–112)
Creatinine, Ser: 1.4 mg/dL — ABNORMAL HIGH (ref 0.4–1.2)
Eosinophils Absolute: 0.3 10*3/uL (ref 0.0–0.7)
Eosinophils Relative: 3.6 % (ref 0.0–5.0)
GFR calc non Af Amer: 41.38 mL/min (ref >60)
Glucose, Bld: 89 mg/dL (ref 70–99)
HCT: 35.2 % — ABNORMAL LOW (ref 36.0–46.0)
Hemoglobin: 11.6 g/dL — ABNORMAL LOW (ref 12.0–15.0)
Iron: 43 ug/dL (ref 42–145)
Lymphocytes Relative: 34.7 % (ref 12.0–46.0)
Lymphs Abs: 3 10*3/uL (ref 0.7–4.0)
MCHC: 33.1 g/dL (ref 30.0–36.0)
MCV: 95.4 fL (ref 78.0–100.0)
Monocytes Absolute: 0.7 10*3/uL (ref 0.1–1.0)
Monocytes Relative: 8.2 % (ref 3.0–12.0)
Neutro Abs: 4.6 10*3/uL (ref 1.4–7.7)
Neutrophils Relative %: 52.6 % (ref 43.0–77.0)
Platelets: 345 10*3/uL (ref 150.0–400.0)
Potassium: 4.3 meq/L (ref 3.5–5.1)
RBC: 3.69 M/uL — ABNORMAL LOW (ref 3.87–5.11)
RDW: 12.8 % (ref 11.5–14.6)
Saturation Ratios: 15.9 % — ABNORMAL LOW (ref 20.0–50.0)
Sodium: 138 meq/L (ref 135–145)
Total Bilirubin: 0.6 mg/dL (ref 0.3–1.2)
Total Protein: 7 g/dL (ref 6.0–8.3)
Transferrin: 193.7 mg/dL — ABNORMAL LOW (ref 212.0–360.0)
WBC: 8.7 10*3/uL (ref 4.5–10.5)

## 2009-09-18 ENCOUNTER — Telehealth: Payer: Self-pay | Admitting: Internal Medicine

## 2009-09-22 ENCOUNTER — Telehealth (INDEPENDENT_AMBULATORY_CARE_PROVIDER_SITE_OTHER): Payer: Self-pay | Admitting: *Deleted

## 2009-09-23 ENCOUNTER — Ambulatory Visit (HOSPITAL_COMMUNITY): Payer: Self-pay | Admitting: Marriage and Family Therapist

## 2009-10-27 ENCOUNTER — Ambulatory Visit: Payer: Self-pay | Admitting: Internal Medicine

## 2009-10-27 ENCOUNTER — Telehealth: Payer: Self-pay | Admitting: Family Medicine

## 2009-10-28 ENCOUNTER — Telehealth: Payer: Self-pay | Admitting: Internal Medicine

## 2009-10-28 LAB — CONVERTED CEMR LAB
Basophils Absolute: 0 10*3/uL (ref 0.0–0.1)
Basophils Relative: 0.8 % (ref 0.0–3.0)
Eosinophils Absolute: 0.2 10*3/uL (ref 0.0–0.7)
Eosinophils Relative: 4.1 % (ref 0.0–5.0)
HCT: 32.8 % — ABNORMAL LOW (ref 36.0–46.0)
Hemoglobin: 11.3 g/dL — ABNORMAL LOW (ref 12.0–15.0)
Iron: 29 ug/dL — ABNORMAL LOW (ref 42–145)
Lymphocytes Relative: 33.7 % (ref 12.0–46.0)
Lymphs Abs: 1.9 10*3/uL (ref 0.7–4.0)
MCHC: 34.3 g/dL (ref 30.0–36.0)
MCV: 93.6 fL (ref 78.0–100.0)
Monocytes Absolute: 0.4 10*3/uL (ref 0.1–1.0)
Monocytes Relative: 6.8 % (ref 3.0–12.0)
Neutro Abs: 3.1 10*3/uL (ref 1.4–7.7)
Neutrophils Relative %: 54.6 % (ref 43.0–77.0)
Platelets: 294 10*3/uL (ref 150.0–400.0)
RBC: 3.5 M/uL — ABNORMAL LOW (ref 3.87–5.11)
RDW: 12.3 % (ref 11.5–14.6)
Saturation Ratios: 11.9 % — ABNORMAL LOW (ref 20.0–50.0)
Transferrin: 173.9 mg/dL — ABNORMAL LOW (ref 212.0–360.0)
WBC: 5.6 10*3/uL (ref 4.5–10.5)

## 2009-10-30 ENCOUNTER — Ambulatory Visit: Payer: Self-pay | Admitting: Gynecology

## 2009-10-30 ENCOUNTER — Other Ambulatory Visit: Admission: RE | Admit: 2009-10-30 | Discharge: 2009-10-30 | Payer: Self-pay | Admitting: Gynecology

## 2009-11-03 ENCOUNTER — Telehealth: Payer: Self-pay | Admitting: Internal Medicine

## 2009-11-10 ENCOUNTER — Encounter (HOSPITAL_COMMUNITY): Admission: RE | Admit: 2009-11-10 | Discharge: 2010-02-08 | Payer: Self-pay | Admitting: Internal Medicine

## 2009-11-27 ENCOUNTER — Telehealth: Payer: Self-pay | Admitting: Family Medicine

## 2009-11-29 ENCOUNTER — Emergency Department (HOSPITAL_COMMUNITY)
Admission: EM | Admit: 2009-11-29 | Discharge: 2009-11-29 | Payer: Self-pay | Source: Home / Self Care | Admitting: Emergency Medicine

## 2009-12-29 ENCOUNTER — Telehealth (INDEPENDENT_AMBULATORY_CARE_PROVIDER_SITE_OTHER): Payer: Self-pay | Admitting: *Deleted

## 2010-01-08 ENCOUNTER — Encounter: Payer: Self-pay | Admitting: Internal Medicine

## 2010-01-27 ENCOUNTER — Telehealth: Payer: Self-pay | Admitting: Family Medicine

## 2010-01-28 ENCOUNTER — Telehealth: Payer: Self-pay | Admitting: Family Medicine

## 2010-02-09 ENCOUNTER — Ambulatory Visit: Payer: Self-pay | Admitting: Internal Medicine

## 2010-02-13 ENCOUNTER — Ambulatory Visit (HOSPITAL_COMMUNITY): Payer: Self-pay | Admitting: Psychiatry

## 2010-02-23 ENCOUNTER — Telehealth: Payer: Self-pay | Admitting: Internal Medicine

## 2010-02-27 ENCOUNTER — Telehealth: Payer: Self-pay | Admitting: Internal Medicine

## 2010-03-27 ENCOUNTER — Telehealth: Payer: Self-pay | Admitting: Internal Medicine

## 2010-04-07 ENCOUNTER — Encounter: Payer: Self-pay | Admitting: Internal Medicine

## 2010-04-29 ENCOUNTER — Telehealth: Payer: Self-pay | Admitting: Internal Medicine

## 2010-04-30 ENCOUNTER — Ambulatory Visit: Payer: Self-pay | Admitting: Internal Medicine

## 2010-05-01 LAB — CONVERTED CEMR LAB
Basophils Absolute: 0.1 10*3/uL (ref 0.0–0.1)
Basophils Relative: 0.7 % (ref 0.0–3.0)
Eosinophils Absolute: 0.3 10*3/uL (ref 0.0–0.7)
Eosinophils Relative: 4.2 % (ref 0.0–5.0)
Ferritin: 420.3 ng/mL — ABNORMAL HIGH (ref 10.0–291.0)
HCT: 36.1 % (ref 36.0–46.0)
Hemoglobin: 12.6 g/dL (ref 12.0–15.0)
Iron: 88 ug/dL (ref 42–145)
Lymphocytes Relative: 34.2 % (ref 12.0–46.0)
Lymphs Abs: 2.5 10*3/uL (ref 0.7–4.0)
MCHC: 34.8 g/dL (ref 30.0–36.0)
MCV: 95.1 fL (ref 78.0–100.0)
Monocytes Absolute: 0.7 10*3/uL (ref 0.1–1.0)
Monocytes Relative: 9.2 % (ref 3.0–12.0)
Neutro Abs: 3.8 10*3/uL (ref 1.4–7.7)
Neutrophils Relative %: 51.7 % (ref 43.0–77.0)
Platelets: 340 10*3/uL (ref 150.0–400.0)
RBC: 3.8 M/uL — ABNORMAL LOW (ref 3.87–5.11)
RDW: 13.9 % (ref 11.5–14.6)
Saturation Ratios: 31.3 % (ref 20.0–50.0)
Transferrin: 200.9 mg/dL — ABNORMAL LOW (ref 212.0–360.0)
WBC: 7.3 10*3/uL (ref 4.5–10.5)

## 2010-05-15 ENCOUNTER — Telehealth: Payer: Self-pay | Admitting: Internal Medicine

## 2010-05-21 ENCOUNTER — Ambulatory Visit: Payer: Self-pay | Admitting: Internal Medicine

## 2010-05-29 ENCOUNTER — Telehealth: Payer: Self-pay | Admitting: Internal Medicine

## 2010-06-01 ENCOUNTER — Encounter: Payer: Self-pay | Admitting: Family Medicine

## 2010-06-01 ENCOUNTER — Ambulatory Visit: Payer: Self-pay | Admitting: Cardiovascular Disease

## 2010-06-24 ENCOUNTER — Telehealth: Payer: Self-pay | Admitting: Internal Medicine

## 2010-07-16 ENCOUNTER — Encounter: Payer: Self-pay | Admitting: Internal Medicine

## 2010-08-19 ENCOUNTER — Ambulatory Visit: Payer: Self-pay | Admitting: Family Medicine

## 2010-09-03 ENCOUNTER — Telehealth: Payer: Self-pay | Admitting: Internal Medicine

## 2010-09-16 ENCOUNTER — Ambulatory Visit
Admission: RE | Admit: 2010-09-16 | Discharge: 2010-09-16 | Payer: Self-pay | Source: Home / Self Care | Attending: Internal Medicine | Admitting: Internal Medicine

## 2010-09-27 ENCOUNTER — Encounter: Payer: Self-pay | Admitting: General Surgery

## 2010-10-05 ENCOUNTER — Telehealth: Payer: Self-pay | Admitting: Internal Medicine

## 2010-10-07 ENCOUNTER — Telehealth: Payer: Self-pay | Admitting: Internal Medicine

## 2010-10-08 ENCOUNTER — Other Ambulatory Visit: Payer: Self-pay

## 2010-10-08 NOTE — Progress Notes (Signed)
Summary: Wants to be seen next week   Phone Note Call from Patient Call back at Home Phone [phone]   Call For: Dr Olevia Perches Reason for Call: Talk to Nurse Summary of Call: Feels that she needs to be seen next week. Is tired of having bm's on herself with warning. Initial call taken by: Irwin Brakeman [facility] [place] [facility],  May 15, 2010 10:54 AM  Follow-up for Phone Call        Patient  having continued problems with UC and wants to come in and discuss with Dr Olevia Perches.  REV scheduled for 05/21/10 2:00 Follow-up by: Barb Merino RN, CGRN,  May 15, 2010 11:02 AM

## 2010-10-08 NOTE — Letter (Signed)
Summary: [facility]   Imported By: Edmonia James 12/16/2008 13:10:38  _____________________________________________________________________  External Attachment:    Type:   Image     Comment:   External Document

## 2010-10-08 NOTE — Progress Notes (Signed)
Summary: Refill Request  Phone Note Refill Request Call back at [phone] Message from:  Pharmacy on December 29, 2009 1:00 PM  Refills Requested: Medication #1:  VICODIN 5-500 MG  TABS Take 1 tablet by mouth every 4-6 hours as needed for pain   Dosage confirmed as above?Dosage Confirmed   Brand Name Necessary? No   Supply Requested: 1 month   Last Refilled: 11/27/2009 [facility] on [address]   Next Appointment Scheduled: none Initial call taken by: Elna Breslow,  December 29, 2009 1:00 PM    Prescriptions: VICODIN 5-500 MG  TABS (HYDROCODONE-ACETAMINOPHEN) Take 1 tablet by mouth every 4-6 hours as needed for pain  #30 x 0   Entered by:   Allyn Kenner CMA   Authorized by:   Garnet Koyanagi DO   Signed by:   Allyn Kenner CMA on 12/29/2009   Method used:   Printed then faxed to ...       [facility] [phone]* (retail)       [address] [place], [place]  [address]       Ph: [phone] or [phone]       Fax: [phone]   RxID:   [id number]

## 2010-10-08 NOTE — Assessment & Plan Note (Signed)
Summary: flu shot//alj  Nurse Visit    Prior Medications: CYMBALTA 60 MG  CPEP (DULOXETINE HCL) 1 1/2 qd TRIAMTERENE-HCTZ 37.5-25 MG  TABS (TRIAMTERENE-HCTZ) 1 qd VICODEN PRN ()  METOPROLOL SUCCINATE 25 MG  TB24 (METOPROLOL SUCCINATE) 1 by mouth qd ZITHROMAX Z-PAK 250 MG  TABS (AZITHROMYCIN) as directed. Don't fill after 08/06/2007 MAGIC MOUTHWASH () 1 tsp swish and spit QID prn Current Allergies: ! SULFA ! MORPHINE   Influenza Vaccine    Vaccine Type: Fluvax Non-MCR    Site: right deltoid    Dose: 0.5 ml    Route: IM    Given by: Chrae Malloy    Exp. Date: 03/05/2008    Lot #: [id number]    VIS given: 03/30/07 version given August 15, 2007.   Orders Added: 1)  Influenza Vaccine NON MCR [[id number]]    ]

## 2010-10-08 NOTE — Assessment & Plan Note (Signed)
Summary: consult for pulmonary nodule   Copy to:  Garnet Koyanagi Primary Provider/Referring Provider:  Garnet Koyanagi, DO  CC:  Pulmonary Consult.  History of Present Illness: The pt is a 55y/o female who comes in today for evaluation of an abnormal ct chest.  She was recently found to have a nodule on cxr in march, and underwent ct chest which showed a less than 1cm density in the anterior aspect of the RUL.  It appeared to have a small quantity of calcium.  She had a f/u ct this month which showed no change in the prior nodule, but also picked up another tiny nodule more lateral.  The pt denies any pulmonary symptoms, and denies unexplained weight loss or anorexia.  She has never lived in the [place] or [place].  She has no known h/o TB exposure, and has had a negative PPD in the distant past.  She has a very remote h/o smoking, and her health maintenance is up to date.  She does have a h/o UC, and is s/p total colectomy with reconstructive surgery.  Preventive Screening-Counseling & Management  Alcohol-Tobacco     Smoking Status: quit  Current Medications (verified): 1)  Cymbalta 60 Mg  Cpep (Duloxetine Hcl) .... 2 By Mouth Qd 2)  Vicodin 5-500 Mg  Tabs (Hydrocodone-Acetaminophen) .... Take 1 Tablet By Mouth Every 4-6 Hours As Needed For Pain 3)  Protonix 40 Mg  Tbec (Pantoprazole Sodium) .Marland Kitchen.. 1 By Mouth Qd 4)  Lopressor 50 Mg  Tabs (Metoprolol Tartrate) .Marland Kitchen.. 1 By Mouth Once Daily 5)  Cipro 250 Mg Tabs (Ciprofloxacin Hcl) .... Take 1 Tablet By Mouth Once A Day 6)  Canasa 1000 Mg Supp (Mesalamine) .... Insert 1 Suppository Into Rectum Every Night As Needed 7)  Multivitamins  Tabs (Multiple Vitamin) .... Take 1 Tablet By Mouth Once A Day 8)  Darvocet A500 100-500 Mg Tabs (Propoxyphene N-Apap) .Marland Kitchen.. 1 By Mouth Every 6 Hours As Needed  Allergies (verified): 1)  ! Sulfa 2)  ! Morphine  Past History:  Past Medical History:   SMALL BOWEL OBSTRUCTION, HX OF (ICD-V12.79) OBESITY,  UNSPECIFIED (ICD-278.00) HYPERPARATHYROIDISM, HX OF (ICD-V12.2) HYPOKALEMIA, HX OF (ICD-V12.2) HYPERTENSION (ICD-401.9) GASTRIC POLYP, HX OF (ICD-V12.79) GERD (ICD-530.81) DEPRESSION (ICD-311) ANXIETY (ICD-300.00) DIARRHEA (ICD-787.91) ANEMIA (ICD-285.9) RENAL INSUFFICIENCY (ICD-588.9) ULCERATIVE COLITIS (ICD-556.9)  Past Surgical History: Reviewed history from 02/05/2008 and no changes required. Loop ileostomy closure  Anal dilation Proctocolectomy and insertion of ileoanal J-Pouch with loop ileostomy Cholecystectomy Tubal Ligation Total Abdominal Hysterectomy  Family History: Reviewed history from 02/20/2008 and no changes required. Family History of Heart Disease: Father F--UC D- UC Granddaughters--- IBS Family History of Bowel disease    allergies: mother heart disease: father   Social History: Reviewed history from 02/05/2008 and no changes required. Pt is married. Patient has 2 children. Alcohol Use - yes-on occasion Illicit Drug Use - no Occupation: Unemployed.  Previously worked for [employer] Patient states former smoker. Started at age 44.  1 ppd.  Quit at age 57 Smoking Status:  quit  Review of Systems       The patient complains of acid heartburn, indigestion, abdominal pain, anxiety, depression, and joint stiffness or pain.  The patient denies shortness of breath with activity, shortness of breath at rest, productive cough, non-productive cough, coughing up blood, chest pain, irregular heartbeats, loss of appetite, weight change, difficulty swallowing, sore throat, tooth/dental problems, headaches, nasal congestion/difficulty breathing through nose, sneezing, itching, ear ache, hand/feet swelling, rash, change in color  of mucus, and fever.    Vital Signs:  Patient profile:   55 year old female Height:      64.5 inches Weight:      155.38 pounds BMI:     26.35 O2 Sat:      100 % on Room air Temp:     97.5 degrees F oral Pulse rate:   75 / minute BP  sitting:   110 / 70  (left arm) Cuff size:   regular  Vitals Entered By: Matthew Folks LPN (February 29, 4460 90:12 AM)  O2 Flow:  Room air  Physical Exam  General:  wd female in nad Eyes:  PERRLA and EOMI.   Nose:  patent without discharge Mouth:  clear Neck:  no jvd, tmg, LN Lungs:  clear to auscultation Heart:  rrr, no mrg Abdomen:  soft and nontender, bs+ Extremities:  no edema, pulses intact distally Neurologic:  alert and oriented, moves all 4.   Impression & Recommendations:  Problem # 1:  CT, CHEST, ABNORMAL (ICD-793.1)  the pt has a pulmonary nodule in RUL anteriorly that appears to be somewhat calcified eccentrically by my interpretation.  She has a very remote smoking history, has had a colectomy, has had a recent negative pap/mammogram, and therefore is in a low risk group.  This is most likely inflammatory, and the best course is surveillance.  Because she is in a low risk group, would do f/u ct in one year.  She will let us know is anything changes clinically.  I have asked her to call us next may, and I will put a reminder to myself in the EMR flag database.  Medications Added to Medication List This Visit: 1)  Canasa 1000 Mg Supp (Mesalamine) .... Insert 1 suppository into rectum every night as needed  Other Orders: Consultation Level IV (22411)  Patient Instructions: 1)  please call in May 2011 to set up chest ct in june.

## 2010-10-08 NOTE — Procedures (Signed)
Summary: Gastroenterology Flex Sig  Gastroenterology colon   Imported By: Renne Musca 11/21/2007 09:10:55  _____________________________________________________________________  External Attachment:    Type:   Image     Comment:   External Document

## 2010-10-08 NOTE — Progress Notes (Signed)
Summary: refills   Phone Note Call from Patient Call back at Work Phone [phone]   Caller: Patient Call For: Olevia Perches Reason for Call: Refill Medication, Talk to Nurse Summary of Call: Patient needs refills for her Vicodin called in to [facility]. Initial call taken by: Ronalee Red,  March 27, 2010 11:33 AM    New/Updated Medications: VICODIN 5-500 MG  TABS (HYDROCODONE-ACETAMINOPHEN) Take 1 tablet by mouth every 4-6 hours as needed for pain Prescriptions: VICODIN 5-500 MG  TABS (HYDROCODONE-ACETAMINOPHEN) Take 1 tablet by mouth every 4-6 hours as needed for pain  #30 x 0   Entered by:   Madlyn Frankel CMA (AAMA)   Authorized by:   Lafayette Dragon MD   Signed by:   [place] (AAMA) on 03/27/2010   Method used:   Printed then faxed to ...       [facility] [phone]* (retail)       [address] [place], [place]  [address]       Ph: [phone] or [phone]       Fax: [phone]   RxID:   [id number]

## 2010-10-08 NOTE — Progress Notes (Signed)
Summary: TRIAGE-labs   Phone Note Call from Patient Call back at Home Phone [phone]   Caller: Patient Call For: brodie Reason for Call: Talk to Nurse Summary of Call: Patient wants to speak to nurse because she would like to get blood work done, pt feels really tired all the time and is wondering if maybe she needs another iron infusion Initial call taken by: Ronalee Red,  Jan 09, 2009 4:49 PM  Follow-up for Phone Call        Pt. c/o worsening fatigue in the past month. Denies blood, black stools,fever,n/v,pain. Last Iron Infusion was  over a year ago. Pt.wants labs drawn.  DR.[name] PLEASE ADVISE Follow-up by: Vivia Ewing LPN,  May  6, 624THL D34-534 PM  Additional Follow-up for Phone Call Additional follow up Details #1::        Last h/h 11/2008 was 12.7. Please draw CBC, Iron ,TIBC,TSH Additional Follow-up by: Lafayette Dragon,  Jan 09, 2009 5:31 PM    Additional Follow-up for Phone Call Additional follow up Details #2::    Above MD orders reviewed with patient. Lab order is in [place]. Pt. instructed to call back as needed.   Follow-up by: Vivia Ewing LPN,  May  7, 624THL 579FGE AM

## 2010-10-08 NOTE — Progress Notes (Signed)
  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  He said he was sending info. Initial call taken by: Garnet Koyanagi DO,  December 12, 2008 11:31 AM  Follow-up for Phone Call        [place] DR. [name] Follow-up by: Carley Hammed,  December 16, 2008 10:31 AM

## 2010-10-08 NOTE — Miscellaneous (Signed)
Summary: Orders Update   Clinical Lists Changes  Problems: Added new problem of CT, CHEST, ABNORMAL (ICD-793.1) Orders: Added new Referral order of Radiology Referral (Radiology) - Signed

## 2010-10-08 NOTE — Progress Notes (Signed)
Summary: Records request received  Records request received from the fax machine. Forwarded to [employer] for processing. Sherri Sear  September 22, 2009 9:40 AM

## 2010-10-08 NOTE — Miscellaneous (Signed)
Summary: Tranxene refill  Clinical Lists Changes  Medications: Changed medication from CLORAZEPATE DIPOTASSIUM 15 MG TABS (CLORAZEPATE DIPOTASSIUM) 1 by mouth three times a day as needed to CLORAZEPATE DIPOTASSIUM 15 MG TABS (CLORAZEPATE DIPOTASSIUM) 1 by mouth three times a day as needed - Signed Rx of CLORAZEPATE DIPOTASSIUM 15 MG TABS (CLORAZEPATE DIPOTASSIUM) 1 by mouth three times a day as needed;  #90 x 0;  Signed;  Entered by: Marlon Pel CMA (AAMA);  Authorized by: Lafayette Dragon MD;  Method used: Printed then faxed to [facility] [phone]*, [address] [place], [place], [place]  [address], Ph: [phone] or [phone], Fax: [phone]    Prescriptions: CLORAZEPATE DIPOTASSIUM 15 MG TABS (CLORAZEPATE DIPOTASSIUM) 1 by mouth three times a day as needed  #90 x 0   Entered by:   Marlon Pel CMA ([place])   Authorized by:   Lafayette Dragon MD   Signed by:   Marlon Pel CMA ([place]) on 04/07/2010   Method used:   Printed then faxed to ...       [facility] [phone]* (retail)       [address] [place], [place]  [address]       Ph: [phone] or [phone]       Fax: [phone]   RxID:   [id number]

## 2010-10-08 NOTE — Progress Notes (Signed)
Summary: TRIAGE  Medications Added CLORAZEPATE DIPOTASSIUM 15 MG TABS (CLORAZEPATE DIPOTASSIUM) 1 by mouth three times a day as needed       Phone Note Call from Patient Call back at Work Phone [phone]   Caller: Patient Call For: Olevia Perches Reason for Call: Talk to Nurse Summary of Call: Patient would like to back on her meds (does not know the name of it) Initial call taken by: Ronalee Red,  September 18, 2009 4:13 PM  Follow-up for Phone Call        Pt. was seen 09-10-09. She states she discussed getting back on her nerve pills, but declined a script at that time.  Pt. is now calling, "I have to have my nerve pills, I am under alot of stress and I need them."  DR.[name] PLEASE ADVISE  Follow-up by: Vivia Ewing LPN,  September 18, 4693 4:18 PM  Additional Follow-up for Phone Call Additional follow up Details #1::        OKI look up what she was on and refill, please DB Additional Follow-up by: Lafayette Dragon MD,  September 19, 2009 12:28 AM    Additional Follow-up for Phone Call Additional follow up Details #2::    per patient med is clorazepate dipotassium 15 mg.  I will send in a refill.  Patient  aware Follow-up by: Barb Merino RN, CGRN,  September 19, 2009 1:40 PM  New/Updated Medications: CLORAZEPATE DIPOTASSIUM 15 MG TABS (CLORAZEPATE DIPOTASSIUM) 1 by mouth three times a day as needed Prescriptions: CLORAZEPATE DIPOTASSIUM 15 MG TABS (CLORAZEPATE DIPOTASSIUM) 1 by mouth three times a day as needed  #90 x 0   Entered by:   Barb Merino RN, CGRN   Authorized by:   Lafayette Dragon MD   Signed by:   Barb Merino RN, CGRN on 09/19/2009   Method used:   Printed then faxed to ...       [facility] [phone]* (retail)       [address] [place], [place]  [address]       Ph: [phone] or [phone]       Fax: [phone]   RxID:   [id number]

## 2010-10-08 NOTE — Assessment & Plan Note (Signed)
Summary: CONSULT..[name].    History of Present Illness Visit Type: Follow-up Visit Primary GI MD: [name] MD Primary Provider: Garnet Koyanagi, DO Requesting Provider: n/a Chief Complaint: Left side abd pain, diarrhea, and one episode of BRB in the stool and when patient wiped after BM.  History of Present Illness:   This is a 55 year old white female with ulcerative colitis who is status post total colectomy and ileoproctostomy with J-pouch in 1999. 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 She has intermittent diarrhea and fecal incontinence. She has been on total disability for ulcerative colitis. She has been followed by renal service for chronic renal insufficiency, Stage II,  with a creatinine of 1.32. She has iron deficiency anemia requiring iron infusions. Her last infusion was in March 2011 and before that in July 2010.  Her last hemoglobin was 11.6 with a hematocrit of 34.5. She has had an intentional weight loss of 70 pounds. Her gastroesophageal reflux is controlled on Protonix 40 mg daily. Her last flexible sigmoidoscopy in June 2009 showed pouchitis and retained sutures at the anastomosis at 15 cm. Her last upper endoscopy in March 2003 showed grade 1 esophagitis.   GI Review of Systems    Reports abdominal pain.     Location of  Abdominal pain: left side.    Denies acid reflux, belching, bloating, chest pain, dysphagia with liquids, dysphagia with solids, heartburn, loss of appetite, nausea, vomiting, vomiting blood, weight loss, and  weight gain.      Reports diarrhea and  rectal bleeding.     Denies anal fissure, black tarry stools, change in bowel habit, constipation, diverticulosis, fecal incontinence, heme positive stool, hemorrhoids, irritable bowel syndrome, jaundice, light color stool, liver problems, and  rectal pain.    Current Medications (verified): 1)  Cymbalta 60 Mg  Cpep (Duloxetine Hcl) .... 2 By Mouth Qd 2)  Vicodin 5-500 Mg  Tabs  (Hydrocodone-Acetaminophen) .... Take 1 Tablet By Mouth Every 4-6 Hours As Needed For Pain 3)  Protonix 40 Mg  Tbec (Pantoprazole Sodium) .... Take 1 Tablet By Mouth Once A Day. 4)  Lopressor 50 Mg  Tabs (Metoprolol Tartrate) .Marland Kitchen.. 1 By Mouth Once Daily 5)  Multivitamins  Tabs (Multiple Vitamin) .... Take 1 Tablet By Mouth Once A Day 6)  Cipro 250 Mg Tabs (Ciprofloxacin Hcl) .... Take 1 Tablet By Mouth Three Times A Day 7)  Metronidazole 250 Mg Tabs (Metronidazole) .... Take 1 Tablet By Mouth Three Times A Day As Needed 8)  Clorazepate Dipotassium 15 Mg Tabs (Clorazepate Dipotassium) .Marland Kitchen.. 1 By Mouth Three Times A Day As Needed 9)  Canasa 1000 Mg Supp (Mesalamine) .... Insert 1 Suppository Into Rectum and Retain At Bedtime 10)  Hydrocortisone 2.5 % Crea (Hydrocortisone) .... Apply To Rectum Two Times A Day 11)  Lomotil 2.5-0.025 Mg Tabs (Diphenoxylate-Atropine) .... Take 1 Tablet By Mouth Two Times A Day  Allergies (verified): 1)  ! Sulfa 2)  ! Morphine  Past History:  Past Medical History: Reviewed history from 02/09/2010 and no changes required.  SMALL BOWEL OBSTRUCTION, HX OF (ICD-V12.79) OBESITY, UNSPECIFIED (ICD-278.00) HYPERPARATHYROIDISM, HX OF (ICD-V12.2) HYPOKALEMIA, HX OF (ICD-V12.2) HYPERTENSION (ICD-401.9) GASTRIC POLYP, HX OF (ICD-V12.79) GERD (ICD-530.81) DEPRESSION (ICD-311) ANXIETY (ICD-300.00) DIARRHEA (ICD-787.91) ANEMIA (ICD-285.9) RENAL INSUFFICIENCY (ICD-588.9) ULCERATIVE COLITIS (ICD-556.9)  Past Surgical History: Reviewed history from 04/23/2009 and no changes required. 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 Family History of Heart Disease: Father F--UC D- UC Granddaughters--- IBS Family History of  Bowel disease allergies: mother heart disease: father  No FH of Colon Cancer:  Social History: Reviewed history from 02/28/2009 and no changes required. Pt is married. Patient has 2 children. Alcohol Use - yes-on occasion Illicit Drug Use - no Occupation: Unemployed.  Previously worked for [employer] Patient states former smoker. Started at age 79.  1 ppd.  Quit at age 74  Review of Systems       The patient complains of arthritis/joint pain.  The patient denies allergy/sinus, anemia, anxiety-new, back pain, blood in urine, breast changes/lumps, change in vision, confusion, cough, coughing up blood, depression-new, fainting, fatigue, fever, headaches-new, hearing problems, heart murmur, heart rhythm changes, itching, menstrual pain, muscle pains/cramps, night sweats, nosebleeds, pregnancy symptoms, shortness of breath, skin rash, sleeping problems, sore throat, swelling of feet/legs, swollen lymph glands, thirst - excessive , urination - excessive , urination changes/pain, urine leakage, vision changes, and voice change.         Pertinent positive and negative review of systems were noted in the above HPI. All other ROS was otherwise negative.   Vital Signs:  Patient profile:   55 year old female Height:      64.5 inches Weight:      154 pounds BMI:     26.12 BSA:     1.76 Pulse rate:   88 / minute Pulse rhythm:   regular BP sitting:   120 / 64  (left arm) Cuff size:   regular  Vitals Entered By: Hope Pigeon CMA (September 16, 2010 8:25 AM)  Physical Exam  General:  Well developed, well nourished, no acute distress. Eyes:  PERRLA, no icterus. Mouth:  No deformity or lesions, dentition normal. Neck:  Supple; no masses or thyromegaly. Lungs:  Clear throughout to auscultation. Heart:  Regular rate and rhythm; no murmurs, rubs,  or bruits. Abdomen:  soft abdomen with normoactive bowel sounds. Well-healed surgical scar. Tenderness left lower quadrant. Rectal:  small amount of  peri-rectal leakage which is Hemoccult-negative. Macerated mucosa around the rectum with very tender anal canal. Digital exam very painful. Stool is Hemoccult negative. Extremities:  No clubbing, cyanosis, edema or deformities noted. Skin:  Intact without significant lesions or rashes. Psych:  Alert and cooperative. Normal mood and affect.   Impression & Recommendations:  Problem # 1:  ULCERATIVE COLITIS (ICD-556.9) Patient is doing well now 12 years since her total colectomy and ileoproctostomy. She has intermittent pouchitis controlled with Cipro and Flagyl. She needs to continue Analpram cream for rectal irritation and Lomotil p.r.n. She takes Vicodin for abdominal pain.  I will see her in 6 months. She gets her labs at the renal clinic.  Problem # 2:  OBESITY, UNSPECIFIED (ICD-278.00) She has maintained a weight loss of 70 pounds.  Problem # 3:  GERD (ICD-530.81) Patient's GERD is controlled with Protonix 40 mg daily.  Problem # 4:  ANEMIA-IRON DEFICIENCY (ICD-280.9) recent CBC shows adequate Iron stores  Patient Instructions: 1)  Continue cyclic therapy with Cipro and Flagyl for pouchitis. 2)  Analpram Cream 2.5% p.r.n. rectal irritation. 3)  Office visit 6 months. 4)  Blood tests as per renal clinic. 5)  Copy sent to : Dr Marval Regal. 6)  The medication list was reviewed and reconciled.  All changed / newly prescribed medications were explained.  A complete medication list was  provided to the patient / caregiver.

## 2010-10-08 NOTE — Progress Notes (Signed)
Summary: Vicodin refill  Medications Added VICODIN 5-500 MG  TABS (HYDROCODONE-ACETAMINOPHEN) Take 1 tablet by mouth every 4-6 hours as needed for pain       Phone Note Call from Patient Call back at Home Phone [phone]   Caller: Patient Call For: [name] Reason for Call: Refill Medication Details for Reason: Vicodin Summary of Call: pt needs refill for generic Vicodin [facility] pharmacy on [address] in [facility] Initial call taken by: Lucien Mons,  September 02, 2008 4:16 PM  Follow-up for Phone Call        Rx faxed to pharmacy. Follow-up by: Awilda Bill CMA,  September 02, 2008 4:32 PM    New/Updated Medications: VICODIN 5-500 MG  TABS (HYDROCODONE-ACETAMINOPHEN) Take 1 tablet by mouth every 4-6 hours as needed for pain   Prescriptions: VICODIN 5-500 MG  TABS (HYDROCODONE-ACETAMINOPHEN) Take 1 tablet by mouth every 4-6 hours as needed for pain  #20 x 0   Entered by:   Awilda Bill CMA   Authorized by:   Lafayette Dragon MD   Signed by:   Awilda Bill CMA on 09/02/2008   Method used:   Printed then faxed to ...       [facility] [phone]* (retail)       [address] [place], [place]  [address]       Ph: [phone] or [phone]       Fax: [phone]   RxID:   [id number]

## 2010-10-08 NOTE — Progress Notes (Signed)
Summary: dehydration  Medications Added METRONIDAZOLE 250 MG  TABS (METRONIDAZOLE) Take 1 twice a day times 7 days COLESTID 1 GM  TABS (COLESTIPOL HCL) 1 tab by mouth twice daily as needed for diarrhea       Phone Note Call from Patient Call back at [phone]   Caller: Patient Call For: brodie Reason for Call: Talk to Nurse Summary of Call: pt has been in hosp b/c of dehydration states that she is drinking plenty of fluids but feels that she's dehydrating again thinks that every time she drinks it goes straight to the pouch(she has no col) needs nurses advised.[name] sch an app for 02-12-08 @ 8:30 ORDERED CHART  Initial call taken by: Ronalee Red,  January 04, 2008 11:09 AM  Follow-up for Phone Call        Pt. discharged [facility]17[facility]. Pt. c/o "extra fluids" in her pouch during the night. Has been drinking ample fluids throught each day. Denies any n/v,pain,temp. etc. States, "I feel fine." She is scheduled to f/u with her primary care MD next week. Advised: 1) keep appt. with primary care 2) Decrease fluid intake after 7PM to see if this helps with fluid leakage throughout the night. 3) Pt. to keep scheduled office visit. 4) Pt. instructed to call back as needed.  Follow-up by: Vivia Ewing LPN,  April 30, 579FGE 11:42 AM  Additional Follow-up for Phone Call Additional follow up Details #1::        [name], please call pt to start Flagyl 250 mg two times a day and try Colestid 1gm by mouth two times a day for diarrhea Additional Follow-up by: Lafayette Dragon MD,  Jan 05, 2008 3:21 PM    Additional Follow-up for Phone Call Additional follow up Details #2::    Pt. notified of above MD orders. Meds to pharmacy.  Follow-up by: Vivia Ewing LPN,  May  1, 579FGE 075-GRM PM  New/Updated Medications: METRONIDAZOLE 250 MG  TABS (METRONIDAZOLE) Take 1 twice a day times 7 days COLESTID 1 GM  TABS (COLESTIPOL HCL) 1 tab by mouth twice daily as needed for  diarrhea   Prescriptions: COLESTID 1 GM  TABS (COLESTIPOL HCL) 1 tab by mouth twice daily as needed for diarrhea  #60 x 1   Entered by:   Vivia Ewing LPN   Authorized by:   Lafayette Dragon MD   Signed by:   Vivia Ewing LPN on 579FGE   Method used:   Electronically sent to ...       [facility] [phone]*       [place], [place]  [address]       Ph: [phone] or [phone]       Fax: [phone]   RxID:   [id number] METRONIDAZOLE 250 MG  TABS (METRONIDAZOLE) Take 1 twice a day times 7 days  #14 x 0   Entered by:   Vivia Ewing LPN   Authorized by:   Lafayette Dragon MD   Signed by:   Vivia Ewing LPN on 579FGE   Method used:   Electronically sent to ...       [facility] [phone]*       [address] [place], [place]  [address]       Ph: [phone]  or [phone]       Fax: [phone]   RxIDCF:[id number]

## 2010-10-08 NOTE — Letter (Signed)
Summary: [place] No Show Letter  [facility] [place]   [place], [place] [address]   Phone: [phone]  Fax: [phone]    01/10/2008 MRN: [id number]  [facility] [address] [place], [place]  [address]   Dear [name],   Our records indicate that you missed your scheduled appointment with ____Dr Paz________________ on ____5/6/09________.  Please contact this office to reschedule your appointment as soon as possible.  It is important that you keep your scheduled appointments with your physician, so we can provide you the best care possible.  Please be advised that there may be a charge for "no show" appointments.    Sincerely,   [place] [facility]

## 2010-10-08 NOTE — Discharge Summary (Signed)
Summary: Abdominal Pain   NAME:  [name], [name]                   ACCOUNT NO.:  [id number]   MEDICAL RECORD NO.:  [id number]          PATIENT TYPE:  INP   LOCATION:  5742                         FACILITY:  [facility]   PHYSICIAN:  [name], M.D.     DATE OF BIRTH:  Feb 09, 1956   DATE OF ADMISSION:  07/30/2006  DATE OF DISCHARGE:  08/03/2006                               DISCHARGE SUMMARY   ADMITTING PHYSICIAN:  [name].   PRIMARY SURGEON:  [name].   DISCHARGING PHYSICIAN:  [name].   CHIEF COMPLAINT AND REASON FOR ADMISSION:  [name] is a 4-year female  patient with prior history of abdominal surgery.  She is status post  total colectomy and ileoproctostomy with a J pouch.  She presented to  the ER with abdominal pain, nausea and vomiting.  On the date of  admission, she began having what she described as normal bowel movements  with liquid coming out around them.  She increased in frequency and  severity of these bowel movements that became more liquid, and she has  severe cramping pain, 10 out of 10 and severe nausea and finally  presented to the ER late in the evening at 10:00 on July 30, 2006.  She was eventually evaluated by surgery early that morning of June 30, 2006 after CT scan demonstrated right-sided partial small-bowel  obstruction.  On clinical exam, she was stable.  She was mildly  tachycardic with a heart rate of 107.  Her abdomen was mildly distended  with tinkling, rushing bowel sounds.  Rectal exam was deferred.  The  patient had received pain medications, so difficult to determine if any  peritoneal signs.  White count was elevated at 14,000, her hemoglobin  was 12.8, creatinine was mildly elevated 1.6.   ADMITTING DIAGNOSES.:  1. Partial small-bowel obstruction.  2. Nausea and vomiting.  3. Mild dehydration.   HOSPITAL COURSE:  The patient was admitted to the general medical floor  where she was placed on n.p.o. status, IV fluids and  bowel rest.  She  continued to have significant abdominal distension and discomfort, so by  the morning of July 31, 2006 around 8:30, she was evaluated by Dr.  ___________ who felt the patient would benefit from an NG tube.  NG tube  was placed, and after this point, serial x-rays were followed   On August 01, 2006, the patient was evaluated that morning.  Her white  count had improved to 9600.  Her potassium was slightly low at 3.2.  Unfortunately, the patient had been soft plugging her NG tube beginning  at 9 p.m. the night before because she was having difficulty sleeping,  and since her abdomen was softer and she was having decreased pain and  looser stools, she felt she had improved and this would be an option.  She did not tell the nursing staff she had done this.  She was also  taking in a significant amount of ice chips and some water and had an NG  output of about 1000  mL, presumed large output secondary to oral intake.   On exam, her abdomen was soft and tender in the lower abdomen.  She had  high-pitched rolling bowel sounds in the right upper quadrant.  At this  point, I counseled the patient on rationale for continuing NG tube to  low wall suction.  This was resumed.  The patient was put on ice chips  only and counseled about why she had to have this, and she was given IV  repletion for her low potassium.   By August 02, 2006, the patient's x-rays had improved markedly.  She  only had 60 mL out from her NG tube over 24 hours.  She was having  flatus and BMs, and she was only mildly tender in her abdomen with good  bowel sounds, so she was started on clear liquid diet, and on the  morning of discharge, the patient had been started on a full liquid  diet, was tolerating well without nausea, vomiting or abdominal pain.  She was requesting discharge home.  The patient was counseled on  remained a soft diet with low residue avoiding popcorn, kernel corn, and  any food  with nuts or seeds, and it is recommended that she follow up  with [name] in 10 to 14 days.  The patient tells me that over a  period of 6-12 months she has had intermittent problems with mild lower  abdominal pain, bloating and symptoms that would resolve.  According to  this history she has given me, the patient sounds like she has been  having intermittent problems with recurrent partial small-bowel  obstructions, and given her history of multiple abdominal procedures, I  am concerned that she will eventually require laparotomy for lysis of  adhesions in the future.  Therefore, I wish she would follow closely up  with [name].      [name], N.P.      ______________________________  [name], M.D.    ALE/MEDQ  D:  08/03/2006  T:  08/03/2006  Job:  [id number]   cc:   Jeanette Bandy. Olevia Perches, MD

## 2010-10-08 NOTE — Progress Notes (Signed)
Summary: Medication refill   Phone Note Call from Patient   Caller: Patient Call For: Dr. Olevia Perches Reason for Call: Refill Medication Summary of Call: Needs a refill on her Hydrocodone Initial call taken by: Webb Laws,  June 24, 2010 12:45 PM  Follow-up for Phone Call        Per patient's pharmacy Waunita Schooner, pharmacist at [facility]) patient just received #30 vicodin 5/325 from Dr Layne Benton on 06/05/10 and received #30 vicodin 5/500 from our office on 05/29/10. Patient does still have 1 refill left on the 5/500, however, I told her that we are unable to give her more refills and that she should have more than enough medication. Patient states "OK. I will have to hunt them down." Follow-up by: Madlyn Frankel CMA Deborra Medina),  June 24, 2010 2:13 PM

## 2010-10-08 NOTE — Progress Notes (Signed)
Summary: Cipro  Medications Added CIPRO 250 MG TABS (CIPROFLOXACIN HCL) Take as directed       Phone Note Call from Patient Call back at Home Phone [phone]   Caller: Patient Call For: Dr. Olevia Perches Reason for Call: Refill Medication Summary of Call: pt would like refill of Cipro for 1 tablet 2xday specifically... [facility] in [place], [phone] Initial call taken by: Lucien Mons,  September 04, 2009 4:16 PM    New/Updated Medications: CIPRO 250 MG TABS (CIPROFLOXACIN HCL) Take as directed Prescriptions: CIPRO 250 MG TABS (CIPROFLOXACIN HCL) Take as directed  #30 x 1   Entered by:   Awilda Bill CMA ([place])   Authorized by:   Lafayette Dragon MD   Signed by:   Awilda Bill CMA ([place]) on 09/04/2009   Method used:   Electronically to        [facility]. [phone]* (retail)       [address] [place], [place]  [address]       Ph: [phone] or [phone]       Fax: [phone]   RxID:   [id number]

## 2010-10-08 NOTE — Letter (Signed)
Summary: Health Exam [place]   Imported By: Edmonia James 03/31/2009 09:44:05  _____________________________________________________________________  External Attachment:    Type:   Image     Comment:   External Document

## 2010-10-08 NOTE — Letter (Signed)
Summary: [facility]  [facility]   Imported By: Velora Heckler 12/07/2007 11:26:16  _____________________________________________________________________  External Attachment:    Type:   Image     Comment:   External Document

## 2010-10-08 NOTE — Procedures (Signed)
Summary: endo written papers  Gastroenterology endo   Imported By: Renne Musca 11/21/2007 09:12:19  _____________________________________________________________________  External Attachment:    Type:   Image     Comment:   External Document

## 2010-10-08 NOTE — Progress Notes (Signed)
Summary: Vicodin refill  Phone Note Call from Patient Call back at Home Phone [phone]   Call For: Dr Olevia Perches Reason for Call: Talk to Nurse Summary of Call: Would like a Vicodin refill has to take one every night because her leg is hurting her. Initial call taken by: Irwin Brakeman [facility],  August 01, 2009 10:26 AM  Follow-up for Phone Call        Vicodin not given for  leg pains. She ought to get pain meds  from the MD who treats her for the leg pain. Follow-up by: Lafayette Dragon MD,  August 02, 2009 7:32 PM  Additional Follow-up for Phone Call Additional follow up Details #1::        Patient advised that we do not treat leg pain and that she should contact her PCP or the MD that treats her leg pain. Patient verbalizes understanding. Additional Follow-up by: Awilda Bill CMA Deborra Medina),  August 04, 2009 9:18 AM

## 2010-10-08 NOTE — Assessment & Plan Note (Signed)
Summary: [name]    History of Present Illness Visit Type: Follow-up Visit Primary GI MD: [name] MD Primary Provider: Garnet Koyanagi, DO Requesting Provider: n/a Chief Complaint: lower abd pain and diarrhea  History of Present Illness:   This is a 55 year old white female with a history of ulcerative colitis. She is status post total colectomy and ileoanal pull through in 1999. She has chronic diarrhea, anxiety and intermittent pouchitis. She has chronic renal insufficiency and iron deficiency anemia requiring iron insfusions. Her last insfusion was in July 2010. She is on total disability due to diarrhea and incontinence. She is complaining off a small amount of rectal drainage and increased frequency in stools. She has increased her Cipro 250 mg to 3 times a day and Flagyl 250 mg 3 times a day.   GI Review of Systems    Reports abdominal pain.     Location of  Abdominal pain: lower abdomen.    Denies acid reflux, belching, bloating, chest pain, dysphagia with liquids, dysphagia with solids, heartburn, loss of appetite, nausea, vomiting, vomiting blood, weight loss, and  weight gain.      Reports diarrhea.     Denies anal fissure, black tarry stools, change in bowel habit, constipation, diverticulosis, fecal incontinence, heme positive stool, hemorrhoids, irritable bowel syndrome, jaundice, light color stool, liver problems, rectal bleeding, and  rectal pain.    Current Medications (verified): 1)  Cymbalta 60 Mg  Cpep (Duloxetine Hcl) .... 2 By Mouth Qd 2)  Vicodin 5-500 Mg  Tabs (Hydrocodone-Acetaminophen) .... Take 1 Tablet By Mouth Every 4-6 Hours As Needed For Pain 3)  Protonix 40 Mg  Tbec (Pantoprazole Sodium) .... Take 1 Tablet By Mouth Once A Day. 4)  Lopressor 50 Mg  Tabs (Metoprolol Tartrate) .Marland Kitchen.. 1 By Mouth Once Daily 5)  Multivitamins  Tabs (Multiple Vitamin) .... Take 1 Tablet By Mouth Once A Day 6)  Cipro 250 Mg Tabs (Ciprofloxacin Hcl) .... Take 1 Tablet By Mouth Three  Times A Day 7)  Metronidazole 250 Mg Tabs (Metronidazole) .... Take 1 Tablet By Mouth Three Times A Day As Needed 8)  Clorazepate Dipotassium 15 Mg Tabs (Clorazepate Dipotassium) .Marland Kitchen.. 1 By Mouth Three Times A Day As Needed 9)  Canasa 1000 Mg Supp (Mesalamine) .... Insert 1 Suppository Into Rectum and Retain At Bedtime  Allergies (verified): 1)  ! Sulfa 2)  ! Morphine  Past History:  Past Medical History: Reviewed history from 02/09/2010 and no changes required.  SMALL BOWEL OBSTRUCTION, HX OF (ICD-V12.79) OBESITY, UNSPECIFIED (ICD-278.00) HYPERPARATHYROIDISM, HX OF (ICD-V12.2) HYPOKALEMIA, HX OF (ICD-V12.2) HYPERTENSION (ICD-401.9) GASTRIC POLYP, HX OF (ICD-V12.79) GERD (ICD-530.81) DEPRESSION (ICD-311) ANXIETY (ICD-300.00) DIARRHEA (ICD-787.91) ANEMIA (ICD-285.9) RENAL INSUFFICIENCY (ICD-588.9) ULCERATIVE COLITIS (ICD-556.9)  Past Surgical History: Reviewed history from 04/23/2009 and no changes required. 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 Family History of Heart Disease: Father F--UC D- UC Granddaughters--- IBS Family History of Bowel disease allergies: mother heart disease: father  No FH of Colon Cancer:  Social History: Reviewed history from 02/28/2009 and no changes required. Pt is married. Patient has 2 children. Alcohol Use - yes-on occasion Illicit Drug Use - no Occupation: Unemployed.  Previously worked for [employer] Patient states former smoker. Started at age 87.  1 ppd.  Quit at age 14  Review of Systems  The patient denies allergy/sinus, anemia, anxiety-new, arthritis/joint pain, back pain, blood in urine, breast changes/lumps, change in vision, confusion, cough, coughing up blood,  depression-new, fainting, fatigue, fever, headaches-new, hearing problems, heart murmur, heart rhythm changes, itching, menstrual pain, muscle pains/cramps, night sweats, nosebleeds, pregnancy symptoms, shortness of breath, skin rash, sleeping problems, sore throat, swelling of feet/legs, swollen lymph glands, thirst - excessive , urination - excessive , urination changes/pain, urine leakage, vision changes, and voice change.         Pertinent positive and negative review of systems were noted in the above HPI. All other ROS was otherwise negative.   Vital Signs:  Patient profile:   55 year old female Height:      64.5 inches Weight:      149 pounds BMI:     25.27 BSA:     1.74 Pulse rate:   88 / minute Pulse rhythm:   regular BP sitting:   100 / 60  (left arm) Cuff size:   regular  Vitals Entered By: Hope Pigeon CMA (May 21, 2010 2:47 PM)  Physical Exam  General:  Well developed, well nourished, no acute distress. Eyes:  PERRLA, no icterus. Mouth:  No deformity or lesions, dentition normal. Neck:  Supple; no masses or thyromegaly. Lungs:  Clear throughout to auscultation. Heart:  Regular rate and rhythm; no murmurs, rubs,  or bruits. Abdomen:  soft abdomen with minimal tenderness in suprapubic area. Normoactive bowel sounds. No distention. Well-healed surgical scars. Rectal:  perianal erythema and external hemorrhoids with tenderness around the anal canal. There is a small amount of drainage of through the rectum. Digital exam not done due to discomfort. Extremities:  No clubbing, cyanosis, edema or deformities noted.   Impression & Recommendations:  Problem # 1:  ULCERATIVE COLITIS (ICD-556.9) Patient has ulcerative colitis and is status post total colectomy. She is having increased frequency of her stools which may be related to irritable bowel syndrome. We will start her on Lomotil one p.o. b.i.d. She has Calmoseptine ointment to use externally and hydrocortisone 2.5% to  use in the anal canal and Canasa suppositories 1,000 mg q.h.s. into the pouch.  Problem # 2:  ANEMIA (ICD-285.9) Patient's last hemoglobin was satisfactory so there is no need for an iron infusion at this time.  Problem # 3:  ANXIETY (ICD-300.00) Patient is on Tranxene 15 mg p.r.n.  Patient Instructions: 1)  Cipro 250 mg p.o. t.i.d.. 2)  Flagyl 250 mg p.o. t.i.d. x 10 days. 3)  Lomotil one p.o. b.i.d. for diarrhea. 4)  Canasa suppositories 1,000 mg at bedtime. 5)  Hydrocortisone cream 2.5% b.i.d. p.r.n. 6)  Calmoseptine cream outside of the rectum for irritation. 7)  She will be due for a colonoscopy in 2012. 8)  The medication list was reviewed and reconciled.  All changed / newly prescribed medications were explained.  A complete medication list was provided to the patient / caregiver. Prescriptions: LOMOTIL 2.5-0.025 MG TABS (DIPHENOXYLATE-ATROPINE) Take 1 tablet by mouth two times a day  #60 x 1   Entered by:   Madlyn Frankel CMA (AAMA)   Authorized by:   Lafayette Dragon MD   Signed by:   [place] (AAMA) on 05/21/2010   Method used:   Printed then faxed to ...       [facility]. [phone]* (retail)       [address]       [place],  [place]  [address]       Ph: [phone] or [phone]       Fax: [phone]   RxID:   [id number] HYDROCORTISONE 2.5 % CREA (HYDROCORTISONE) Apply to rectum two times a day  #30 grams x 2   Entered by:   Madlyn Frankel CMA ([place])   Authorized by:   Lafayette Dragon MD   Signed by:   Madlyn Frankel CMA ([place]) on 05/21/2010   Method used:   Electronically to        [facility]. [phone]* (retail)       [address] [place], [place]  [address]       Ph: [phone] or [phone]       Fax: [phone]   RxID:   [id number]

## 2010-10-08 NOTE — Progress Notes (Signed)
Summary: APPT SCHEDULED  Phone Note Call from Patient   Summary of Call: PT CALLED SAYS SHE HAS SCHEDULED THE APPT W/ DR [place].  FYI TO DR Shawna Orleans.Marland KitchenMarland KitchenAllen Norris  February 05, 2008 9:34 AM Initial call taken by: Allen Norris,  February 05, 2008 9:34 AM

## 2010-10-08 NOTE — Assessment & Plan Note (Signed)
Summary: FLU SHOT.CBS   Nurse Visit    Prior Medications: CYMBALTA 60 MG  CPEP (DULOXETINE HCL) 2 by mouth qd VICODIN 5-500 MG  TABS (HYDROCODONE-ACETAMINOPHEN) Take 1 tablet by mouth every 4-6 hours as needed for pain PROTONIX 40 MG  TBEC (PANTOPRAZOLE SODIUM) 1 by mouth qd LOPRESSOR 50 MG  TABS (METOPROLOL TARTRATE) 1 by mouth once daily CIPRO 250 MG TABS (CIPROFLOXACIN HCL) Take 1 tablet by mouth once a day CANASA 1000 MG SUPP (MESALAMINE) Insert 1 suppository into rectum every night Current Allergies: ! SULFA ! MORPHINE    Orders Added: 1)  Admin 1st Vaccine [90471] 2)  Flu Vaccine 4yrs + [id number]   Flu Vaccine Consent Questions     Do you have a history of severe allergic reactions to this vaccine? no    Any prior history of allergic reactions to egg and/or gelatin? no    Do you have a sensitivity to the preservative Thimersol? no    Do you have a past history of Guillan-Barre Syndrome? no    Do you currently have an acute febrile illness? no    Have you ever had a severe reaction to latex? no    Vaccine information given and explained to patient? yes    Are you currently pregnant? no    Lot Number:[id number]   Exp Date:03/05/2009   Site Given  Left Deltoid IM].opcflu

## 2010-10-08 NOTE — Progress Notes (Signed)
Summary: ? re prep   Phone Note Call from Patient Call back at Home Phone [phone]   Caller: Patient Call For: brodie Reason for Call: Talk to Nurse Summary of Call: pt want to know is she can drink magnesium instead of doing fleet b4 proc tom  Initial call taken by: Ronalee Red,  February 08, 2008 4:32 PM  Follow-up for Phone Call        Spoke with patient, and she has always used mag citrate. States would really like to not use the fleets due to the soreness of her ostomy.  I told her that Dr. Olevia Perches just thought that it would be easier on her if she only took the fleets.  She said, "no."  The pt. will be taking the mag citrate. Follow-up by: Ernestine Conrad RN,  February 08, 2008 4:42 PM

## 2010-10-08 NOTE — Assessment & Plan Note (Signed)
Summary: acute cough, congested/cbs   Vital Signs:  Patient Profile:   55 Years Old Female Height:     64.5 inches Weight:      153.6 pounds Temp:     98.0 degrees F oral Pulse rate:   73 / minute BP sitting:   100 / 80  (left arm)  Pt. in pain?   no  Vitals Entered By: Rolla Flatten CMA (July 23, 2008 12:53 PM)                  PCP:  Garnet Koyanagi, DO  Chief Complaint:  cough, chest congestion, and URI symptoms.  History of Present Illness:  URI Symptoms      This is a 55 year old woman who presents with URI symptoms.  Pt here with few week hx cough and congestion--Took Mucinex DM with some help but still coughing.  The patient reports sore throat and productive cough, but denies nasal congestion, clear nasal discharge, purulent nasal discharge, and earache.  The patient denies fever, low-grade fever (<100.5 degrees), fever of 100.5-103 degrees, fever of 103.1-104 degrees, fever to >104 degrees, stiff neck, dyspnea, wheezing, rash, vomiting, diarrhea, use of an antipyretic, and response to antipyretic.  The patient denies itchy watery eyes, itchy throat, sneezing, seasonal symptoms, response to antihistamine, headache, muscle aches, and severe fatigue.  The patient denies the following risk factors for Strep sinusitis: unilateral facial pain, unilateral nasal discharge, poor response to decongestant, double sickening, tooth pain, Strep exposure, tender adenopathy, and absence of cough.       Current Allergies (reviewed today): ! SULFA ! MORPHINE  Past Medical History:    Reviewed history from 02/05/2008 and no changes required:       Current Problems:        SMALL BOWEL OBSTRUCTION, HX OF (ICD-V12.79)       OBESITY, UNSPECIFIED (ICD-278.00)       HYPERPARATHYROIDISM, HX OF (ICD-V12.2)       HYPOKALEMIA, HX OF (ICD-V12.2)       HYPERTENSION (ICD-401.9)       GASTRIC POLYP, HX OF (ICD-V12.79)       GERD (ICD-530.81)       DEPRESSION (ICD-311)       ANXIETY  (ICD-300.00)       DIARRHEA (ICD-787.91)       ANEMIA (ICD-285.9)       RENAL INSUFFICIENCY (ICD-588.9)       ULCERATIVE COLITIS (ICD-556.9)         Past Surgical History:    Reviewed history from 02/05/2008 and no changes required:       Loop ileostomy closure        Anal dilation       Proctocolectomy and insertion of ileoanal J-Pouch with loop ileostomy       Cholecystectomy       Tubal Ligation       Total Abdominal Hysterectomy   Family History:    Reviewed history from 02/20/2008 and no changes required:       Family History of Heart Disease: Father       F--UC       D- UC       Granddaughters--- IBS       Family History of Bowel disease  Social History:    Reviewed history from 02/05/2008 and no changes required:       Patient has never smoked.        Patient has 2 children  Alcohol Use - yes-on occasion       Illicit Drug Use - no       Occupation: Photographer   Risk Factors: Tobacco use:  never Drug use:  no Alcohol use:  yes  Mammogram History:    Date of Last Mammogram:  02/20/2007   Review of Systems      See HPI   Physical Exam  General:     Well-developed,well-nourished,in no acute distress; alert,appropriate and cooperative throughout examination Head:     Normocephalic and atraumatic without obvious abnormalities. No apparent alopecia or balding. Ears:     External ear exam shows no significant lesions or deformities.  Otoscopic examination reveals clear canals, tympanic membranes are intact bilaterally without bulging, retraction, inflammation or discharge. Hearing is grossly normal bilaterally. Nose:     External nasal examination shows no deformity or inflammation. Nasal mucosa are pink and moist without lesions or exudates. Mouth:     Oral mucosa and oropharynx without lesions or exudates.  Teeth in good repair. Neck:     supple, full ROM, and cervical lymphadenopathy.   Lungs:     normal respiratory effort, R decreased breath  sounds, and L decreased breath sounds.   Heart:     normal rate, regular rhythm, and no murmur.   Extremities:     No clubbing, cyanosis, edema, or deformity noted with normal full range of motion of all joints.   Skin:     Intact without suspicious lesions or rashes Cervical Nodes:     L anterior LN tender and R anterior LN enlarged.   Psych:     Cognition and judgment appear intact. Alert and cooperative with normal attention span and concentration. No apparent delusions, illusions, hallucinations    Impression & Recommendations:  Problem # 1:  BRONCHITIS-ACUTE (ICD-466.0)  Her updated medication list for this problem includes:    Cipro 250 Mg Tabs (Ciprofloxacin hcl) .Marland Kitchen... Take 1 tablet by mouth once a day    Zithromax Z-pak 250 Mg Tabs (Azithromycin) .Marland Kitchen... 2 by mouth once daily for 1 day then 1 by mouth once daily for 4 more days.  Orders: Depo- Medrol 62m (J1040) Admin of Therapeutic Inj  intramuscular or subcutaneous ((41638 Xopenex 1.223m(J(G5364Nebulizer Tx (9(68032 Take antibiotics and other medications as directed. Encouraged to push clear liquids, get enough rest, and take acetaminophen as needed. To be seen in 5-7 days if no improvement, sooner if worse.   Complete Medication List: 1)  Cymbalta 60 Mg Cpep (Duloxetine hcl) .... 2 by mouth qd 2)  Vicodin 5-500 Mg Tabs (Hydrocodone-acetaminophen) .... Take 1 tablet by mouth every 4-6 hours as needed for pain 3)  Protonix 40 Mg Tbec (Pantoprazole sodium) ...Marland Kitchen 1 by mouth qd 4)  Lopressor 50 Mg Tabs (Metoprolol tartrate) ...Marland Kitchen 1 by mouth once daily 5)  Cipro 250 Mg Tabs (Ciprofloxacin hcl) .... Take 1 tablet by mouth once a day 6)  Canasa 1000 Mg Supp (Mesalamine) .... Insert 1 suppository into rectum every night 7)  Zithromax Z-pak 250 Mg Tabs (Azithromycin) .... 2 by mouth once daily for 1 day then 1 by mouth once daily for 4 more days.    Prescriptions: ZITHROMAX Z-PAK 250 MG TABS (AZITHROMYCIN) 2 by mouth once  daily for 1 day then 1 by mouth once daily for 4 more days.  #1 pack x 1   Entered and Authorized by:   YvGarnet KoyanagiO   Signed by:   YvGarnet KoyanagiO on 07/23/2008  Method used:   Electronically to        [facility]. [phone]* (retail)       [address] [place], [place]  [address]       Ph: [phone] or [phone]       Fax: [phone]   RxID:   [id number]  ]  Medication Administration  Injection # 1:    Medication: Depo- Medrol 35m    Diagnosis: BRONCHITIS-ACUTE (ICD-466.0)    Route: IM    Site: LUOQ gluteus    Exp Date: 05/07/2009    Lot #: [id number]    Mfr: sicor    Patient tolerated injection without complications    Given by: FRolla FlattenCMA (July 23, 2008 1:53 PM)  Medication # 1:    Medication: Xopenex 1.234m   Diagnosis: BRONCHITIS-ACUTE (ICD-466.0)    Route: inhaled    Exp Date: 08/06/2009    Lot #: [id number]    Mfr: sepracor    Patient tolerated medication without complications    Given by: FelRolla FlattenA (July 23, 2008 1:55 PM)  Orders Added: 1)  Depo- Medrol 20m82m1040] 2)  Admin of Therapeutic Inj  intramuscular or subcutaneous [96372] 3)  Xopenex 1.25mg38m61[L4917]Est. Patient Level IV [9921[91505]Nebulizer Tx [9464[69794]

## 2010-10-08 NOTE — Assessment & Plan Note (Signed)
Summary: ROUTINE F-UP/MEDS/FH  Medications Added CIPRO 250 MG TABS (CIPROFLOXACIN HCL) Take 1 tablet by mouth once a day CANASA 1000 MG SUPP (MESALAMINE) Insert 1 suppository into rectum every night        History of Present Illness Visit Type: follow up Primary GI MD: [name] MD Primary Provider: Garnet Koyanagi, DO Chief Complaint: follow-up visit/meds History of Present Illness:   55 year old white female with ulcerative colitis; status post total colectomy and ileoanal pull through in 1998, done in 2 stages. This patient has chronic anemia, chronic diarrhea and pouchitis. Her last flexible sigmoidoscopy was in 2005 and most recently in June 2009. The exam and biopsies showed active low-grade colitis from 0-15 cm. She is currently applying for disability because of fecal incontinence and urgency. Her last hemoglobin was 12.3 with a hematocrit of 35.6. She has intentionally lost about 50 pounds from 223 pounds to around 157 pounds. She is now feeling very well ; her diarrhea has improved. She does not see any blood per rectum and denies any abdominal pain. She does, however, have constant rectal discomfort and urgency. She feels that Cipro 250 mg a day is keeping her symptoms under control and would like refills. She is also asking for Canasa suppositories.   GI Review of Systems      Denies abdominal pain, acid reflux, belching, bloating, chest pain, dysphagia with liquids, dysphagia with solids, heartburn, loss of appetite, nausea, vomiting, vomiting blood, weight loss, and  weight gain.      Reports diarrhea, fecal incontinence, and  rectal pain.     Denies anal fissure, black tarry stools, change in bowel habit, constipation, diverticulosis, heme positive stool, hemorrhoids, irritable bowel syndrome, jaundice, light color stool, liver problems, and  rectal bleeding.     Prior Medications Reviewed Using: Patient Recall  Updated Prior Medication List: CYMBALTA 60 MG  CPEP  (DULOXETINE HCL) 2 by mouth qd VICODIN 5-500 MG  TABS (HYDROCODONE-ACETAMINOPHEN) Take 1 tablet by mouth every 4-6 hours as needed for pain PROTONIX 40 MG  TBEC (PANTOPRAZOLE SODIUM) 1 by mouth qd LOPRESSOR 50 MG  TABS (METOPROLOL TARTRATE) 1 by mouth once daily  Current Allergies (reviewed today): ! SULFA ! MORPHINE  Past Medical History:    Reviewed history from 02/05/2008 and no changes required:       Current Problems:        SMALL BOWEL OBSTRUCTION, HX OF (ICD-V12.79)       OBESITY, UNSPECIFIED (ICD-278.00)       HYPERPARATHYROIDISM, HX OF (ICD-V12.2)       HYPOKALEMIA, HX OF (ICD-V12.2)       HYPERTENSION (ICD-401.9)       GASTRIC POLYP, HX OF (ICD-V12.79)       GERD (ICD-530.81)       DEPRESSION (ICD-311)       ANXIETY (ICD-300.00)       DIARRHEA (ICD-787.91)       ANEMIA (ICD-285.9)       RENAL INSUFFICIENCY (ICD-588.9)       ULCERATIVE COLITIS (ICD-556.9)         Past Surgical History:    Reviewed history from 02/05/2008 and no changes required:       Loop ileostomy closure        Anal dilation       Proctocolectomy and insertion of ileoanal J-Pouch with loop ileostomy       Cholecystectomy       Tubal Ligation       Total Abdominal Hysterectomy  Family History:    Reviewed history from 02/20/2008 and no changes required:       Family History of Heart Disease: Father       F--UC       D- UC       Granddaughters--- IBS       Family History of Bowel disease  Social History:    Reviewed history from 02/05/2008 and no changes required:       Patient has never smoked.        Patient has 2 children       Alcohol Use - yes-on occasion       Illicit Drug Use - no       Occupation: Photographer    Review of Systems       The patient complains of anxiety-new, depression-new, fatigue, muscle pains/cramps, sleeping problems, thirst - excessive, and urine leakage.     Vital Signs:  Patient Profile:   55 Years Old Female Height:     64.5 inches Weight:       156 pounds BMI:     26.46 Pulse rate:   68 / minute Pulse rhythm:   regular BP sitting:   102 / 70  (left arm)  Vitals Entered By: June McMurray CMA (May 27, 2008 8:43 AM)                  Physical Exam  General:     Well developed, well nourished, no acute distress. Ringwarm infection at the dorsum of her left hand. Neck:     Supple; no masses or thyromegaly. Lungs:     Clear throughout to auscultation. Heart:     Regular rate and rhythm; no murmurs, rubs,  or bruits. Abdomen:     soft abdomen with normoactive bowel sounds, no tenderness, no distention, liver edge at costal margin    Impression & Recommendations:  Problem # 1:  TINEA CORPORIS (ICD-110.5) patient is being treated with Clotrimazole cream.  Problem # 2:  ULCERATIVE COLITIS (ICD-556.9) pouchitis is in reasonable control on Cipro. Patient is applying for disability and the basis of fecal incontinence and diarrhea.  Problem # 3:  OBESITY, UNSPECIFIED (ICD-278.00) intentional weight loss of 50 pounds to a current weight of 157 pounds. Patient's goal weight is 145 pounds.  Problem # 4:  ANXIETY (ICD-300.00) patient is on Cymbalta. She is doing well emotionally at the moment.   Patient Instructions: 1)  Canasa suppositories 1053m q.h.s. 2)  Cipro 250 mg p.o. q.d. 3)  Office visit 6 months 4)  Copy Sent To:Dr YGarnet Koyanagi   Prescriptions: CANASA 1000 MG SUPP (MESALAMINE) Insert 1 suppository into rectum every night  #30 x 6   Entered by:   DAwilda BillCMA   Authorized by:   DLafayette DragonMD   Signed by:   DAwilda BillCMA on 05/27/2008   Method used:   Electronically to        [facility] #[phone] (retail)       [address] [place]  [address]      Ph: [phone]      Fax: [phone]  RxID:   [id number] 250 MG TABS (CIPROFLOXACIN HCL) Take 1 tablet by mouth once a day  #30 x 11   Entered by:   DAwilda BillCMA    Authorized by:  Lafayette Dragon MD   Signed by:   Awilda Bill CMA on 05/27/2008   Method used:   Electronically to        [facility] [phone]* (retail)       [address] [place], [place]  [address]       Ph: [phone] or [phone]       Fax: [phone]   RxID:   [id number]  ]

## 2010-10-08 NOTE — Letter (Signed)
Summary: [facility] No Show Letter  [facility]   [place], [place] [address]   Phone: [phone]  Fax: [phone]    01/10/2008 MRN: [id number]  [facility] [place] [address] [place], [place]  [address]   Dear Ms. Weckerly,   Our records indicate that you missed your scheduled appointment with ________Dr Paz_____________ on _____5/6/09_______.  Please contact this office to reschedule your appointment as soon as possible.  It is important that you keep your scheduled appointments with your physician, so we can provide you the best care possible.  Please be advised that there may be a charge for "no show" appointments.    Sincerely,   [place] [facility]

## 2010-10-08 NOTE — Progress Notes (Signed)
  Phone Note Outgoing Call Call back at [facility] Phone [phone]   Call placed by: Carley Hammed,  February 12, 2009 8:03 AM Call placed to: Patient Summary of Call: nodules are the same---refer to pulm for further eval  Patient aware Carley Hammed  February 12, 2009 8:03 AM

## 2010-10-08 NOTE — Progress Notes (Signed)
Summary: RX  Medications Added PROTONIX 40 MG  TBEC (PANTOPRAZOLE SODIUM) 1 by mouth qd       Phone Note Call from Patient Call back at Home Phone [phone]   Caller: Patient Call For: Shaneta Cervenka Reason for Call: Refill Medication, Talk to Nurse Details for Reason: RX Summary of Call: needs refill on generic for protonix [facility] 3 mo supply # [phone] Initial call taken by: Quenton Fetter [facility],  June 27, 2008 12:51 PM  Follow-up for Phone Call        Rx Called In Follow-up by: Awilda Bill CMA,  June 27, 2008 1:03 PM    New/Updated Medications: PROTONIX 40 MG  TBEC (PANTOPRAZOLE SODIUM) 1 by mouth qd   Prescriptions: PROTONIX 40 MG  TBEC (PANTOPRAZOLE SODIUM) 1 by mouth qd  #90 x 1   Entered by:   Awilda Bill CMA   Authorized by:   Lafayette Dragon MD   Signed by:   Awilda Bill CMA on 06/27/2008   Method used:   Printed then faxed to ...       [facility]       [address] [place]  [address]       Ph: [phone]       Fax: [phone]   RxID:   [id number]

## 2010-10-08 NOTE — Progress Notes (Signed)
Summary: Requests a Disability letter    Phone Note Call from Patient Call back at Home Phone [phone] Call back at [phone] CELL   Caller: Patient Call For: [name] Reason for Call: Talk to Nurse Summary of Call: pt had FLX on 6-5 and cannot recall what was dicussed with pt's mother.  does not know what she need to do next Initial call taken by: Quenton Fetter [facility],  February 15, 2008 2:11 PM  Follow-up for Phone Call        No answer, I will try back later. Vivia Ewing LPN  June 11, 579FGE [id number] PM   Follow-up by: Vivia Ewing LPN,  June 11, 579FGE [id number] PM  Additional Follow-up for Phone Call Additional follow up Details #1::        Pt. wants Dr.[name] to write her a letter to help get disability. States she has been out of work since last August, and feels great,now,but not well enough to stand all day at [employer]. Is also requesting a letter from  [facility]. (Call her at [phone])   Additional Follow-up by: Vivia Ewing LPN,  June 11, 579FGE 075-GRM PM    Additional Follow-up for Phone Call Additional follow up Details #2::    Letter to [place] dictated. Follow-up by: Lafayette Dragon MD,  March 07, 2008 1:19 PM

## 2010-10-08 NOTE — Progress Notes (Signed)
Summary: Vicodin   Phone Note Call from Patient Call back at Work Phone [phone]   Caller: Patient Call For: Dr. Olevia Perches Reason for Call: Refill Medication Summary of Call: would like Vicodin called in today verses tomorrow... [facility] [address] in [place]... please call pt Initial call taken by: Lucien Mons,  February 27, 2010 1:25 PM  Follow-up for Phone Call        I have left a message for the patient to call back.  Follow-up by: Madlyn Frankel CMA Deborra Medina),  February 27, 2010 1:34 PM  Additional Follow-up for Phone Call Additional follow up Details #1::        Again, I have gotten no answer at the number above. I will assume the patient has worked out the problem. Additional Follow-up by: Madlyn Frankel CMA Deborra Medina),  February 27, 2010 4:28 PM

## 2010-10-08 NOTE — Letter (Signed)
Summary: [facility]   Imported By: Bubba Hales 01/26/2010 09:39:08  _____________________________________________________________________  External Attachment:    Type:   Image     Comment:   External Document

## 2010-10-08 NOTE — Assessment & Plan Note (Signed)
Summary: rx renewal...em    History of Present Illness Visit Type: Follow-up Visit Primary GI MD: [name] MD Primary Provider: Garnet Koyanagi, DO Requesting Provider: n/a Chief Complaint: Patient wants refill on [place] because per patient Dr. Etter Sjogren will not refill anymore.  History of Present Illness:   This is a 55 year old white female with ulcerative colitis for which she is status post total colectomy and ileo-anal pull through approximately 12 years ago. She has had ulcerative colitis since the 1990s. Her last flexible sigmoidoscopy was in June 2009. She has periodic pouchitis. She is followed at the renal clinic for her renal insufficiency. She has a history of severe iron deficiency anemia requiring iron infusions. Her last infusion was February 2011. Patient is maintained on Cipro 250 mg twice a day for pouchitis and on Flagyl 250 mg up to 3 a day p.r.n. pouchitis. She has a history of depression and anxiety and an intentional weight loss of 80 pounds from 230 pounds down to 150 pounds.   GI Review of Systems      Denies abdominal pain, acid reflux, belching, bloating, chest pain, dysphagia with liquids, dysphagia with solids, heartburn, loss of appetite, nausea, vomiting, vomiting blood, weight loss, and  weight gain.        Denies anal fissure, black tarry stools, change in bowel habit, constipation, diarrhea, diverticulosis, fecal incontinence, heme positive stool, hemorrhoids, irritable bowel syndrome, jaundice, light color stool, liver problems, rectal bleeding, and  rectal pain.    Current Medications (verified): 1)  Cymbalta 60 Mg  Cpep (Duloxetine Hcl) .... 2 By Mouth Qd 2)  Vicodin 5-500 Mg  Tabs (Hydrocodone-Acetaminophen) .... Take 1 Tablet By Mouth Every 4-6 Hours As Needed For Pain 3)  Protonix 40 Mg  Tbec (Pantoprazole Sodium) .... Take 1 Tablet By Mouth Once A Day. 4)  Lopressor 50 Mg  Tabs (Metoprolol Tartrate) .Marland Kitchen.. 1 By Mouth Once Daily 5)  Multivitamins  Tabs  (Multiple Vitamin) .... Take 1 Tablet By Mouth Once A Day 6)  Cipro 250 Mg Tabs (Ciprofloxacin Hcl) .... Take 1 Tablet By Mouth Two Times A Day 7)  Clonazepam 0.5 Mg Tabs (Clonazepam) .... Take 1/2 At Bedtime Daily 8)  Metronidazole 250 Mg Tabs (Metronidazole) .... Take 1 Tablet By Mouth Three Times A Day As Needed 9)  Clorazepate Dipotassium 15 Mg Tabs (Clorazepate Dipotassium) .Marland Kitchen.. 1 By Mouth Three Times A Day As Needed  Allergies (verified): 1)  ! Sulfa 2)  ! Morphine  Past History:  Past Medical History:  SMALL BOWEL OBSTRUCTION, HX OF (ICD-V12.79) OBESITY, UNSPECIFIED (ICD-278.00) HYPERPARATHYROIDISM, HX OF (ICD-V12.2) HYPOKALEMIA, HX OF (ICD-V12.2) HYPERTENSION (ICD-401.9) GASTRIC POLYP, HX OF (ICD-V12.79) GERD (ICD-530.81) DEPRESSION (ICD-311) ANXIETY (ICD-300.00) DIARRHEA (ICD-787.91) ANEMIA (ICD-285.9) RENAL INSUFFICIENCY (ICD-588.9) ULCERATIVE COLITIS (ICD-556.9)  Past Surgical History: Reviewed history from 04/23/2009 and no changes required. 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 Family History of Heart Disease: Father F--UC D- UC Granddaughters--- IBS Family History of Bowel disease allergies: mother heart disease: father  No FH of Colon Cancer:  Social History: Reviewed history from 02/28/2009 and no changes required. Pt is married. Patient has 2 children. Alcohol Use - yes-on occasion Illicit Drug Use - no Occupation: Unemployed.  Previously worked for [employer] Patient states former smoker. Started at age 54.  1 ppd.  Quit at  age 89  Review of Systems       Pertinent positive and negative review of systems were noted in the above HPI. All other ROS was otherwise negative.   Vital  Signs:  Patient profile:   55 year old female Height:      64.5 inches Weight:      150 pounds BMI:     25.44 BSA:     1.74 Pulse rate:   88 / minute Pulse rhythm:   regular BP sitting:   98 / 60  (left arm) Cuff size:   regular  Vitals Entered By: Hope Pigeon CMA (February 09, 2010 10:37 AM)  Physical Exam  General:  Well developed, well nourished, no acute distress. Mouth:  No deformity or lesions, dentition normal. Neck:  Supple; no masses or thyromegaly. Lungs:  Clear throughout to auscultation. Heart:  Regular rate and rhythm; no murmurs, rubs,  or bruits. Abdomen:  soft, nontender abdomen with some loose skin and decreased muscle tone from massive weight loss. Mild tendernes, left lower quadrant. Well-healed surgical scars. Site of the prior ileostomy appears somewhat red with leakage of clear liquid. Rectal:  intense perianal erythema and scarring from prior proctitis with tender anal canal and Hemoccult-negative stool.   Impression & Recommendations:  Problem # 1:  ULCERATIVE COLITIS (ICD-556.9) Patient is doing well and is status post total colectomy and ileoanal pull through. There is no evidence of pouchitis.Marland Kitchen She will continue her Cipro and Flagyl as well as Canasa suppositories. Intermittent rectal and pelvic pain is related to her prior surgery. She needs refills on Vicodin due to pain from intermittent pouchitis.  Problem # 2:  ANEMIA-IRON DEFICIENCY (ICD-280.9) Patient's most recent hemoglobin was 12 with iron stores at normal limits. She is status post recent iron infusion in February 2011. She needs a CBC and iron studies every 3 months. This is most often done at the renal clinic.  Problem # 3:  DIARRHEA (ICD-787.91) Patient is status post episode of dehydration requiring rehydration with IV fluids.  Patient Instructions: 1)  increase fluid intake. 2)  Refill Canasa suppositories.  3)  Vicodin5-500, #30.  4)  Cipro Refills. 5)  Office visit 6 months. 6)   Repeat CBC and iron studies in 3 months. 7)  Copy sent to : Dr Etter Sjogren, Dr Servando Salina 8)  The medication list was reviewed and reconciled.  All changed / newly prescribed medications were explained.  A complete medication list was provided to the patient / caregiver. Prescriptions: CANASA 1000 MG SUPP (MESALAMINE) Insert 1 suppository into rectum and retain at bedtime  #12 x 2   Entered by:   Madlyn Frankel CMA (AAMA)   Authorized by:   Lafayette Dragon MD   Signed by:   Madlyn Frankel CMA (AAMA) on 02/09/2010   Method used:   Electronically to        [facility] [phone]* (retail)       [place], [place]  [address]       Ph: [phone] or [phone]       Fax: [phone]   RxID:   [id number] CIPRO 250 MG TABS (CIPROFLOXACIN HCL) Take 1 tablet by mouth two times a day  #60 x 6   Entered by:   Madlyn Frankel CMA ([place])   Authorized by:   Lafayette Dragon MD   Signed by:   Carla Drape  Nelson-Smith CMA (AAMA) on 02/09/2010   Method used:   Electronically to        [facility] [phone]* (retail)       [address] [place],   [address]       Ph: [phone] or [phone]       Fax: [phone]   RxID:   [id number]

## 2010-10-08 NOTE — Assessment & Plan Note (Signed)
Summary: INFECTION IN HER INTESTINE/YF    History of Present Illness Visit Type: Follow-up Visit Primary GI MD: [name] MD Primary Provider: Garnet Koyanagi, DO Requesting Provider: n/a Chief Complaint: Infection in her j-pouch & some UC History of Present Illness:   This is a 55 year old white female with ulcerative colitis for which she is status post total colectomy and ileoanal pullthrough in 1998. Patient's last colonoscopies were in 2005 and in June 2009. 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 She has been unable to  This is a 55 year old white female with ulcerative colitis for which she is status post total colectomy and ileoanal pullthrough in 1998. Her last office visit was in December 2009. Patient's last colonoscopies were in 2005 and in June 2009. 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 She has been unable to hold a job because of frequent stools and incontinence. Labwork completed at the time of patient's last office visit with [place] was all essentially normal. She was asked to come back for follow labs, however, she never did so. Patient comes today complaining of a recurrent infection in her j-pouch although she is taking cipro and flagyl.   GI Review of Systems      Denies abdominal pain, acid reflux, belching, bloating, chest pain, dysphagia with liquids, dysphagia with solids, heartburn, loss of appetite, nausea, vomiting, vomiting blood, weight loss, and  weight gain.        Denies anal fissure, black tarry stools, change in bowel habit, constipation, diarrhea, diverticulosis, fecal incontinence, heme positive stool, hemorrhoids, irritable bowel syndrome, jaundice, light color stool, liver problems, rectal bleeding, and  rectal pain.    Current Medications (verified): 1)  Cymbalta 60 Mg  Cpep (Duloxetine Hcl) .... 2 By Mouth Qd 2)  Vicodin 5-500 Mg  Tabs (Hydrocodone-Acetaminophen)  .... Take 1 Tablet By Mouth Every 4-6 Hours As Needed For Pain 3)  Protonix 40 Mg  Tbec (Pantoprazole Sodium) .... Take 1 Tablet By Mouth Once A Day. 4)  Lopressor 50 Mg  Tabs (Metoprolol Tartrate) .Marland Kitchen.. 1 By Mouth Once Daily 5)  Multivitamins  Tabs (Multiple Vitamin) .... Take 1 Tablet By Mouth Once A Day 6)  Cipro 250 Mg Tabs (Ciprofloxacin Hcl) .... Take As Directed 7)  Clonazepam 0.5 Mg Tabs (Clonazepam) .... Take 1/2 At Bedtime Daily  Allergies (verified): 1)  ! Sulfa 2)  ! Morphine  Past History:  Past Medical History: Reviewed history from 02/28/2009 and no changes required.   SMALL BOWEL OBSTRUCTION, HX OF (ICD-V12.79) OBESITY, UNSPECIFIED (ICD-278.00) HYPERPARATHYROIDISM, HX OF (ICD-V12.2) HYPOKALEMIA, HX OF (ICD-V12.2) HYPERTENSION (ICD-401.9) GASTRIC POLYP, HX OF (ICD-V12.79) GERD (ICD-530.81) DEPRESSION (ICD-311) ANXIETY (ICD-300.00) DIARRHEA (ICD-787.91) ANEMIA (ICD-285.9) RENAL INSUFFICIENCY (ICD-588.9) ULCERATIVE COLITIS (ICD-556.9)  Past Surgical History: Reviewed history from 04/23/2009 and no changes required. 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 Family History of Heart Disease: Father F--UC D- UC Granddaughters--- IBS Family History of Bowel disease allergies: mother heart disease: father  No FH of Colon Cancer:  Social History: Reviewed history from 02/28/2009 and no changes required. Pt is married. Patient has 2 children. Alcohol Use - yes-on occasion Illicit Drug Use - no Occupation: Unemployed.  Previously worked for [employer] Patient states former smoker. Started at age 56.  1 ppd.  Quit at age 60  Review of Systems       The patient complains of anemia, anxiety-new, arthritis/joint  pain, depression-new, fatigue, muscle pains/cramps, and sleeping problems.         Pertinent positive and negative review of systems were noted in the above HPI. All other ROS was otherwise negative.   Vital Signs:  Patient profile:   55 year old female Height:      64.5 inches Weight:      153.50 pounds Pulse rate:   84 / minute Pulse rhythm:   regular BP sitting:   96 / 66  (left arm) Cuff size:   regular  Vitals Entered By: June McMurray [place] Deborra Medina) (September 10, 2009 11:30 AM)   Impression & Recommendations:  Problem # 1:  ULCERATIVE COLITIS (ICD-556.9) Patient has ulcerative colitis and is status post total colectomy and ileoanal pull through in 1998 with J-pouch formation. She has ntermittent pouchitis. A recent flareup of pouchitis is now getting under control with Flagyl 250 mg 3 times a day. She continues on Cipro 250 mg twice a day. A sgmoidoscopy in 2005 and again in June 2009 showed changes of the pouch consistent with pouchitis. Patient has a history of anemia requiring Iron infusions. Orders: TLB-CBC Platelet - w/Differential (85025-CBCD) TLB-CMP (Comprehensive Metabolic Pnl) (0000000) TLB-IBC Pnl (Iron/FE;Transferrin) (83550-IBC)  Problem # 2:  OBESITY, UNSPECIFIED (ICD-278.00) Patient is status post intentional weight loss. She is now 153 pounds.  Problem # 3:  ANEMIA (ICD-285.9) recheck CBC and iron studies today.  Patient Instructions: 1)  CBC, IBC panel, CMET today. 2)  Flagyl 250 three times a day. 3)  Continue Cipro 250 mg two times a day. 4)  Copy sent to : Garnet Koyanagi, DO Prescriptions: METRONIDAZOLE 250 MG TABS (METRONIDAZOLE) Take 1 tablet by mouth three times a day  #90 x 3   Entered by:   Awilda Bill CMA ([place])   Authorized by:   Lafayette Dragon MD   Signed by:   Awilda Bill CMA ([place]) on 09/10/2009   Method used:   Electronically to        [facility] [phone]* (retail)       [address] [place], [place]   [address]       Ph: [phone] or [phone]       Fax: [phone]   RxID:   [id number] CIPRO 250 MG TABS (CIPROFLOXACIN HCL) Take 1 tablet by mouth two times a day  #60 x 3   Entered by:   Awilda Bill CMA ([place])   Authorized by:   Lafayette Dragon MD   Signed by:   Awilda Bill CMA ([place]) on 09/10/2009   Method used:   Electronically to        [facility] [phone]* (retail)       [address] [place], [place]  [address]       Ph: [phone] or [phone]       Fax: [phone]   RxID:   [id number]

## 2010-10-08 NOTE — Assessment & Plan Note (Signed)
Summary: LEG & ARM  PAIN/CBS   Vital Signs:  Patient profile:   55 year old female Height:      64.5 inches Weight:      151 pounds Temp:     98.0 degrees F oral Pulse rate:   72 / minute Resp:     18 per minute BP sitting:   110 / 78  (left arm)  Vitals Entered By: Carley Hammed (February 04, 2009 10:55 AM) CC: both legs and right elbow pain and hand pain Is Patient Diabetic? No  Have you ever been in a relationship where you felt threatened, hurt or afraid?No   Does patient need assistance? Functional Status Self care Ambulation Normal   History of Present Illness: Pt here still c/o b/l hand pain thumb side  and R elbow pain.  pt is using elbow strap with no relief.  She was doing water aerobic and she that that what was making her hips hurt.  She stopped exercising so much and she had no relief.  Pt was told she had Rheumatoid arthritis when she was in her 32s.  It "went away" and she has not a problem since.----until now.    Current Medications (verified): 1)  Cymbalta 60 Mg  Cpep (Duloxetine Hcl) .... 2 By Mouth Qd 2)  Vicodin 5-500 Mg  Tabs (Hydrocodone-Acetaminophen) .... Take 1 Tablet By Mouth Every 4-6 Hours As Needed For Pain 3)  Protonix 40 Mg  Tbec (Pantoprazole Sodium) .Marland Kitchen.. 1 By Mouth Qd 4)  Lopressor 50 Mg  Tabs (Metoprolol Tartrate) .Marland Kitchen.. 1 By Mouth Once Daily 5)  Cipro 250 Mg Tabs (Ciprofloxacin Hcl) .... Take 1 Tablet By Mouth Once A Day 6)  Canasa 1000 Mg Supp (Mesalamine) .... Insert 1 Suppository Into Rectum Every Night 7)  Multivitamins  Tabs (Multiple Vitamin) .... Take 1 Tablet By Mouth Once A Day 8)  Darvocet A500 100-500 Mg Tabs (Propoxyphene N-Apap) .Marland Kitchen.. 1 By Mouth Every 6 Hours As Needed  Allergies (verified): 1)  ! Sulfa 2)  ! Morphine  Past History:  Past medical, surgical, family and social histories (including risk factors) reviewed, and no changes noted (except as noted below).  Past Medical History: Reviewed history from 02/05/2008 and no  changes required. Current Problems:  SMALL BOWEL OBSTRUCTION, HX OF (ICD-V12.79) OBESITY, UNSPECIFIED (ICD-278.00) HYPERPARATHYROIDISM, HX OF (ICD-V12.2) HYPOKALEMIA, HX OF (ICD-V12.2) HYPERTENSION (ICD-401.9) GASTRIC POLYP, HX OF (ICD-V12.79) GERD (ICD-530.81) DEPRESSION (ICD-311) ANXIETY (ICD-300.00) DIARRHEA (ICD-787.91) ANEMIA (ICD-285.9) RENAL INSUFFICIENCY (ICD-588.9) ULCERATIVE COLITIS (ICD-556.9)  Past Surgical History: Reviewed history from 02/05/2008 and no changes required. Loop ileostomy closure  Anal dilation Proctocolectomy and insertion of ileoanal J-Pouch with loop ileostomy Cholecystectomy Tubal Ligation Total Abdominal Hysterectomy  Family History: Reviewed history from 02/20/2008 and no changes required. Family History of Heart Disease: Father F--UC D- UC Granddaughters--- IBS Family History of Bowel disease  Social History: Reviewed history from 02/05/2008 and no changes required. Patient has never smoked.  Patient has 2 children Alcohol Use - yes-on occasion Illicit Drug Use - no Occupation: Photographer  Review of Systems      See HPI  Physical Exam  General:  Well-developed,well-nourished,in no acute distress; alert,appropriate and cooperative throughout examination Msk:  normal ROM, no joint tenderness, no joint swelling, no joint warmth, no redness over joints, no joint deformities, and no joint instability.   Extremities:  No clubbing, cyanosis, edema, or deformity noted with normal full range of motion of all joints.   Neurologic:  alert & oriented X3,  strength normal in all extremities, and gait normal.     Impression & Recommendations:  Problem # 1:  PAIN IN JOINT, MULTIPLE SITES (ICD-719.49) Tylenol arthritis Orders: TLB-BMP (Basic Metabolic Panel-BMET) (77373-GKKDPTE) TLB-CBC Platelet - w/Differential (85025-CBCD) TLB-TSH (Thyroid Stimulating Hormone) (84443-TSH) TLB-Sedimentation Rate (ESR) (85652-ESR) TLB-Rheumatoid Factor  (RA) (70761-HH) rto as needed   Complete Medication List: 1)  Cymbalta 60 Mg Cpep (Duloxetine hcl) .... 2 by mouth qd 2)  Vicodin 5-500 Mg Tabs (Hydrocodone-acetaminophen) .... Take 1 tablet by mouth every 4-6 hours as needed for pain 3)  Protonix 40 Mg Tbec (Pantoprazole sodium) .Marland Kitchen.. 1 by mouth qd 4)  Lopressor 50 Mg Tabs (Metoprolol tartrate) .Marland Kitchen.. 1 by mouth once daily 5)  Cipro 250 Mg Tabs (Ciprofloxacin hcl) .... Take 1 tablet by mouth once a day 6)  Canasa 1000 Mg Supp (Mesalamine) .... Insert 1 suppository into rectum every night 7)  Multivitamins Tabs (Multiple vitamin) .... Take 1 tablet by mouth once a day 8)  Darvocet A500 100-500 Mg Tabs (Propoxyphene n-apap) .Marland Kitchen.. 1 by mouth every 6 hours as needed  Other Orders: Venipuncture (83437) T-Antinuclear Antib (ANA) ([id number]) Prescriptions: DARVOCET A500 100-500 MG TABS (PROPOXYPHENE N-APAP) 1 by mouth every 6 hours as needed  #30 x 0   Entered and Authorized by:   Garnet Koyanagi DO   Signed by:   Garnet Koyanagi DO on 02/04/2009   Method used:   Print then Give to Patient   RxID:   [id number]

## 2010-10-08 NOTE — Progress Notes (Signed)
Summary: TRIAGE   Phone Note Call from Patient Call back at Work Phone [phone]   Call For: DR Layman Gully Summary of Call: Her intestines keep getting infection-doesnt know what else to do. Scheduled rov but its not until Feb. Initial call taken by: Irwin Brakeman [facility],  September 08, 2009 1:19 PM  Follow-up for Phone Call        Message left for patient to callback. Vivia Ewing LPN  January  3, 624THL 3:09 PM   Pt. states she has an infection in her j-pouch. She is taking Flagyl 250mg  TID for 7 days. Also taking her Cipro daily. Wants an OV with Dr.Brittanni Cariker. Appt. scheduled for 09-10-09 at 10:45am. Pt. instructed to call back as needed.  Follow-up by: Vivia Ewing LPN,  January  3, 624THL 3:58 PM

## 2010-10-08 NOTE — Procedures (Signed)
Summary: FLEX SIG   Flexible Sigmoidoscopy  Procedure date:  02/09/2008  Comments:      Location: [facility].   Procedures Next Due Date:    Flexible Sigmoidoscopy: 02/2011  Patient Name: [name], [name] MRN:  Procedure Procedures: Flexible Proctosigmoidoscopy CPT: [phone].    with biopsy. CPT: [id number].  Personnel: Endoscopist: [name] L. Olevia Perches, MD.  Exam Location: Exam performed in Outpatient Clinic. Outpatient  Patient Consent: Procedure, Alternatives, Risks and Benefits discussed, consent obtained, from patient.  Comments: recall flex sigmoid in 5 years Indications Symptoms: Diarrhea. Patient is having frequent stools. Hematochezia.  Surveillance of: Ulcerative Colitis. 2005.  Comments: s/p total colectomy and ileoanal pullthrough [phone] ( 2 stage procedure), chronic anemia, diarrhea, pouchitis History  Current Medications: Patient is not currently taking Coumadin.  Pre-Exam Physical: Performed Feb 09, 2008. Entire physical exam was normal.  Comments: Pt. history reviewed/updated, physical exam performed prior to sedation?yes Exam Exam: Extent visualized: Ileum. Extent of exam: 35 cm. Duration of exam: 15 minutes. Images taken. ASA Classification: II. Tolerance: good.  Colon Prep Used Mag Citrate for colon prep. Prep: good.  Sedation Meds: Fentanyl 50 mcg. given IV. Versed 7 mg. given IV.  Findings - PRIOR SURGERY: Sigmoid Colon. Total Colectomy. Anastamosis present. Biopsy/Prior Surgery taken.  Comments:  at 3 cm retained  suture, at 15 cm ileocolic anastomosis, open ,no stricture, difussely friable and bleeding mucosa from rectum to 20 cm, c/w active colitis.   Assessment Abnormal examination, see findings above.  Comments: active colitis/pouchitis 0-20 cm, s/p total colectomy and an ileo- anal pullthrough in 1998 for UC Events  Unplanned Intervention: No intervention was required.  Unplanned Events: There were no  complications. Plans Medication Plan: Await pathology. Flagyl: 250 BID, starting Feb 09, 2008  CIpro: 250 BID, starting Feb 09, 2008   Comments: follow H/H closely, infuse Iron periodically Disposition: After procedure patient sent to recovery. After recovery patient sent home.   cc:  Jackolyn Confer MD      Donetta Potts MD  REPORT OF SURGICAL PATHOLOGY   Case #: [id number] Patient Name: DAILYNN, [name] Office Chart Number:  N/A   MRN: [id number] Pathologist: H. Lynnell Chad, MD DOB/Age  May 29, 1956 (Age: 55)    Gender: F Date Taken:  02/09/2008 Date Received: 02/12/2008   FINAL DIAGNOSIS   ***MICROSCOPIC EXAMINATION AND DIAGNOSIS***   1.  ILEOANAL ANASTOMOSIS SITE, BIOPSY:  CHRONIC ACTIVE INFLAMMATION, NO DYSPLASIA OR MALIGNANCY IDENTIFIED.  PLEASE SEE COMMENT.   2.  RECTUM, 8 CM, BIOPSY:  MINUTE COLORECTAL MUCOSA WITH ASSOCIATED GRANULATION TISSUE AND INFLAMED EXUDATES.  NO VIRAL CYTOPATHIC CHANGES OR DYSPLASIA SEEN.     COMMENT 1.  The biopsies show acute and chronic inflammation, villous atrophy, pseudopyloric metaplasia and associated granulation tissue and surface ulceration.  Crypt architectural distortion and villous blunting/distortion are also present.  The lamina propria is expanded by a mixed inflammatory infiltrate composed of plasma cells, eosinophils, and lymphocytes.  Paneth cell hyperplasia is also present.  There is no evidence of granuloma, dysplasia or malignancy identified. Furthermore, there is no evidence of viral cytopathic changes. 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    2.  The biopsies are predominately composed of granulation tissue with associated inflammatory exudates and a small/minute fragment of colorectal mucosa.  There is no evidence of viral cytopathic changes.    Furthermore, there is no evidence of dysplasia or malignancy.  Clinical and endoscopic correlation  is highly recommended. (  HCL:gt, 02/13/08)    gdt Date Reported:  02/13/2008     H. Lynnell Chad, MD *** Electronically Signed Out By CL1 ***   Clinical information History of ulcerative colitis (kv)   specimen(s) obtained 1: Ileum, biopsy 2: Rectum, biopsy, 8 cm   Gross Description 1.  Received in formalin are tan, soft tissue fragments that are submitted in toto.  Number:    2         Size:      0.3 and 0.4 cm   2.  Received in formalin are tan, soft tissue fragments that are submitted in toto.  Number:  multiple. Size:  each 0.3 cm  (GRP:kv 02-12-08)   kv/     Signed by Lafayette Dragon MD on 02/13/2008 at 4:44 PM  ________________________________________________________________________ recall flex. sigmoid in 3 years   Signed by Lafayette Dragon MD on 02/13/2008 at 4:44 PM  ________________________________________________________________________  February 13, 2008 MRN: [id number]    [facility] [address] [place], [place]  [address]    Dear Ms. Namba,  I am pleased to inform you that the biopsies taken during your recent colonoscopy did not show any evidence of cancer upon pathologic examination.biopsies show pouchitis,with lot of inflammation  Additional information/recommendations:     _x_Continue with the treatment plan as outlined on the day of your      exam.We may have to rotate Your antibiotics a bit. Call if diarrhea flares up  bad again.  _x_You should have a repeat colonoscopy examination for this problem           in _3 years.  Please call us if you are having persistent problems or have questions about your condition that have not been fully answered at this time.  Sincerely,  Lafayette Dragon MD  This letter has been electronically signed by your physician.   Signed by Lafayette Dragon MD on 02/13/2008 at 4:49 PM  ________________________________________________________________________ This report was created from the original endoscopy report, which was  reviewed and signed by the above listed endoscopist.

## 2010-10-08 NOTE — Letter (Signed)
Summary: [facility]   Imported By: Velora Heckler 08/28/2007 14:34:25  _____________________________________________________________________  External Attachment:    Type:   Image     Comment:   External Document

## 2010-10-08 NOTE — Progress Notes (Signed)
Summary: Iron Infusion Scheduled   Phone Note Outgoing Call   Call placed by: Vivia Ewing LPN,  February 22, 624THL 9:00 AM Call placed to: Patient Summary of Call: See labs from 10-27-09. Pt. is scheduled for an Iron Infusion at [facility] on 11-10-09 at 8am. Repeat labs on 12-25-09, the order is in [place]. Message left for patient to callback.  Initial call taken by: Vivia Ewing LPN,  February 22, 624THL 9:01 AM  Follow-up for Phone Call        Above MD orders reviewed with patient. Pt. instructed to call back as needed.  Follow-up by: Vivia Ewing LPN,  February 22, 624THL 11:47 AM

## 2010-10-08 NOTE — Progress Notes (Signed)
Summary: PERSONAL  Phone Note Other Incoming   Call placed by: [name],  July 05, 2007 10:25 AM Details for Reason: personal  Summary of Call: CALLBACK# [phone] PT WAS IN OFFICE TODAY WITH MOTHER WANTED TO TALK TO DR Cletus Gash WOULDN'T GO INTO DETAIL INFORMED PT THAT SHE WOULD NEED TO LEAVE MESSAGE DIDN'T WANT TO JUST SAID TO HAVE DR. ALEJANDRO CALL HER BECAUSE OF SOMETHING WITH COURT INFORMED THAT SHE NOT BE RECEIVING CALL FROM DR Cletus Gash BUT NURSE WILL MORE THAN LIKELY BE RETURNING CALL.  Follow-up for Phone Call        Phone call completed: Spoke to patient. States daughter is divorcing her husband and she was wondering if I have seen him as a patient here. He was a patient of mind per [name] at [facility]. They are having a  custody battle. I advise [name] that I am not at liberty to discuss details of anyone to her except her own medical care with me.  She expressed  understanding. Follow-up by: Jeanette Haven MD,  July 05, 2007 3:03 PM

## 2010-10-08 NOTE — Progress Notes (Signed)
Summary: TRIAGE   Phone Note Call from Patient Call back at Home Phone [phone]   Caller: Patient Call For: Olevia Perches Reason for Call: Talk to Nurse Summary of Call: Patient wants to speak directly to [facility] Initial call taken by: Ronalee Red,  November 03, 2009 9:03 AM  Follow-up for Phone Call        Pt. states she and her husband seperated last week and she is under tremendous stress. On Thursday pt. began with an "Intestional Infection" didn't began her Cipro/Flagyl until Saturday. Pt. states her stools are loose to watery & LLQ pain. Denies blood, black stools, fever, n/v.  1) Continue antibiotics as directed 2) Vicodin as needed 3) Full liquids x24 hours. Advance to soft,bland diet x2-4 days. Advanced as tolerated. 4) Heating pad to abdomen as needed. 5) Immodium as needed for diarrhea. 6) If symptoms become worse call back immediately or go to ER.   Follow-up by: Vivia Ewing LPN,  February 28, 624THL 10:29 AM  Additional Follow-up for Phone Call Additional follow up Details #1::        they have been  separated for few months. Reviewed and agree. Additional Follow-up by: Lafayette Dragon MD,  November 03, 2009 1:07 PM

## 2010-10-08 NOTE — Progress Notes (Signed)
Summary: refill  Medications Added VICODIN 5-500 MG  TABS (HYDROCODONE-ACETAMINOPHEN) Take 1 tablet by mouth every 4-6 hours as needed for pain NOT TO BE FILLED UNTIL 02/28/10       Phone Note Call from Patient Call back at [facility] Phone [phone]   Caller: Patient Call For: Dr. Olevia Perches Reason for Call: Talk to Nurse Summary of Call: would like a refill of Vicodin... [facility] pharmacy on [address] in [place]... would like 60 tabs instead of 30  Initial call taken by: Lucien Mons,  February 23, 2010 10:33 AM  Follow-up for Phone Call        I will send #30 vicodin to pharmacy to be filled on 02/28/10. I will not send 60, as Dr Astrid Drafts #30. Patient advised. Follow-up by: Madlyn Frankel CMA Deborra Medina),  February 23, 2010 10:37 AM    New/Updated Medications: VICODIN 5-500 MG  TABS (HYDROCODONE-ACETAMINOPHEN) Take 1 tablet by mouth every 4-6 hours as needed for pain NOT TO BE FILLED UNTIL 02/28/10 Prescriptions: VICODIN 5-500 MG  TABS (HYDROCODONE-ACETAMINOPHEN) Take 1 tablet by mouth every 4-6 hours as needed for pain NOT TO BE FILLED UNTIL 02/28/10  #30 x 0   Entered by:   Madlyn Frankel CMA ([place])   Authorized by:   Lafayette Dragon MD   Signed by:   [place] ([place]) on 02/23/2010   Method used:   Printed then faxed to ...       [facility] [phone]* (retail)       [address] [place], [place]  [address]       Ph: [phone] or [phone]       Fax: [phone]   RxID:   [id number]

## 2010-10-08 NOTE — Assessment & Plan Note (Signed)
Summary: pain in hip,ringworm on left hand//fd   Vital Signs:  Patient profile:   55 year old female Height:      64.5 inches Weight:      151 pounds Temp:     98.2 degrees F oral Pulse rate:   74 / minute Resp:     18 per minute BP sitting:   120 / 70  (right arm)  Vitals Entered By: Carley Hammed (December 16, 2008 8:10 AM) CC: PAIN IN LEFT HIP AND LEG AND LEFT HAND RINGWORM  Have you ever been in a relationship where you felt threatened, hurt or afraid?No   Does patient need assistance? Functional Status Self care Ambulation Normal   History of Present Illness: Pt here c/o rash L hand---no better.  See last ov.  It is not itchy but is more sensitive.  Naftin did not help Pt also c/o L hip pain.  She sleeps on her Right side and has noticed since losing weight that her left hip hurts during night.  No known injury.  It does hurt during day as well.  Pt can take tylenol arthritis but has not done so regularly.  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) 4)  Small Bowel Obstruction, Hx of  (ICD-V12.79) 5)  Obesity, Unspecified  (ICD-278.00) 6)  Hyperparathyroidism, Hx of  (ICD-V12.2) 7)  Hypokalemia, Hx of  (ICD-V12.2) 8)  Hypertension  (ICD-401.9) 9)  Gastric Polyp, Hx of  (ICD-V12.79) 10)  Gerd  (ICD-530.81) 11)  Depression  (ICD-311) 12)  Anxiety  (ICD-300.00) 13)  Diarrhea  (ICD-787.91) 14)  Anemia  (ICD-285.9) 15)  Renal Insufficiency  (ICD-588.9) 16)  Ulcerative Colitis  (ICD-556.9) 17)  Syncope and Collapse  (ICD-780.2) 18)  Hypokalemia  (ICD-276.8) 19)  Leukocytosis  (ICD-288.60) 20)  Hyponatremia  (ICD-276.1) 21)  Dehydration  (ICD-276.51) 22)  Anxiety  (ICD-300.00) 23)  Anemia-iron Deficiency  (ICD-280.9) 24)  Renal Insufficiency  (ICD-588.9) 25)  Esophagitis  (ICD-530.10) 26)  Gastric Polyp  (ICD-211.1) 27)  Ulcerative Colitis  (ICD-556.9) 28)  Tinnitus  Nos  (ICD-388.30) 29)  De Quervain's Tenosynovitis  (ICD-727.04) 30)  Hypertension  (ICD-401.9) 31)  Hyperlipidemia  (ICD-272.4) 32)  Gerd  (ICD-530.81) 33)  Depression  (ICD-311)  Medications Prior to Update: 1)  Cymbalta 60 Mg  Cpep (Duloxetine Hcl) .... 2 By Mouth Qd 2)  Vicodin 5-500 Mg  Tabs (Hydrocodone-Acetaminophen) .... Take 1 Tablet By Mouth Every 4-6 Hours As Needed For Pain 3)  Protonix 40 Mg  Tbec (Pantoprazole Sodium) .Marland Kitchen.. 1 By Mouth Qd 4)  Lopressor 50 Mg  Tabs (Metoprolol Tartrate) .Marland Kitchen.. 1 By Mouth Once Daily 5)  Cipro 250 Mg Tabs (Ciprofloxacin Hcl) .... Take 1 Tablet By Mouth Once A Day 6)  Canasa 1000 Mg Supp (Mesalamine) .... Insert 1 Suppository Into Rectum Every Night 7)  Multivitamins  Tabs (Multiple Vitamin) .... Take 1 Tablet By Mouth Once A Day  Current Medications (verified): 1)  Cymbalta 60 Mg  Cpep (Duloxetine Hcl) .... 2 By Mouth Qd 2)  Vicodin 5-500 Mg  Tabs (Hydrocodone-Acetaminophen) .... Take 1 Tablet By Mouth Every 4-6 Hours As Needed For Pain 3)  Protonix 40 Mg  Tbec (Pantoprazole Sodium) .Marland Kitchen.. 1 By Mouth Qd 4)  Lopressor 50 Mg  Tabs (Metoprolol Tartrate) .Marland Kitchen.. 1 By Mouth Once Daily 5)  Cipro 250 Mg Tabs (Ciprofloxacin Hcl) .... Take 1 Tablet By Mouth Once  A Day 6)  Canasa 1000 Mg Supp (Mesalamine) .... Insert 1 Suppository Into Rectum Every Night 7)  Multivitamins  Tabs (Multiple Vitamin) .... Take 1 Tablet By Mouth Once A Day  Allergies (verified): 1)  ! Sulfa 2)  ! Morphine  Past History:  Family History:    Family History of Heart Disease: Father    F--UC    D- UC    Granddaughters--- IBS    Family History of Bowel disease     (02/20/2008)  Social History:    Patient has never smoked.     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  Past Medical History:    Reviewed history from 02/05/2008 and no changes required:    Current Problems:     SMALL BOWEL OBSTRUCTION, HX OF (ICD-V12.79)    OBESITY, UNSPECIFIED (ICD-278.00)    HYPERPARATHYROIDISM, HX OF (ICD-V12.2)    HYPOKALEMIA, HX OF (ICD-V12.2)    HYPERTENSION (ICD-401.9)    GASTRIC POLYP, HX OF (ICD-V12.79)    GERD (ICD-530.81)    DEPRESSION (ICD-311)    ANXIETY (ICD-300.00)    DIARRHEA (ICD-787.91)    ANEMIA (ICD-285.9)    RENAL INSUFFICIENCY (ICD-588.9)    ULCERATIVE COLITIS (ICD-556.9)  Past Surgical History:    Reviewed history from 02/05/2008 and no changes required:    Loop ileostomy closure     Anal dilation    Proctocolectomy and insertion of ileoanal J-Pouch with loop ileostomy    Cholecystectomy    Tubal Ligation    Total Abdominal Hysterectomy  Family History:    Reviewed history from 02/20/2008 and no changes required:       Family History of Heart Disease: Father       F--UC       D- UC       Granddaughters--- IBS       Family History of Bowel disease  Social History:    Reviewed history from 02/05/2008 and no changes required:       Patient has never smoked.        Patient has 2 children       Alcohol Use - yes-on occasion       Illicit Drug Use - no       Occupation: Photographer  Review of Systems      See HPI  Physical Exam  General:  Well-developed,well-nourished,in no acute distress; alert,appropriate and cooperative throughout examination Msk:  normal ROM, no joint tenderness, no joint swelling, no joint warmth, no redness over joints, no joint deformities, no joint instability, and no crepitation.     Extremities:  No clubbing, cyanosis, edema, or deformity noted with normal full range of motion of all joints.   Neurologic:  strength normal in all extremities, gait normal, and DTRs symmetrical and normal.   Skin:  circular  hyperpigmented lesion on back L hand Psych:  Cognition and judgment appear intact. Alert and cooperative with normal attention span and concentration. No apparent delusions, illusions, hallucinations   Impression & Recommendations:  Problem # 1:  HIP PAIN, LEFT (ICD-719.45) Use pillow between knees at night and rto as needed   Discussed use of medications, application of heat or cold, and exercises.   Problem # 2:  SKIN RASH (ICD-782.1)  derm referral not responsive to naftin  Discussed medication use and symptom control.   Complete Medication List: 1)  Cymbalta 60 Mg Cpep (Duloxetine hcl) .... 2 by mouth qd 2)  Vicodin 5-500 Mg Tabs (Hydrocodone-acetaminophen) .... Take 1 tablet by mouth every 4-6 hours as needed for pain 3)  Protonix 40 Mg Tbec (Pantoprazole sodium) .Marland Kitchen.. 1 by mouth qd 4)  Lopressor 50 Mg Tabs (Metoprolol tartrate) .Marland Kitchen.. 1 by mouth once daily 5)  Cipro 250 Mg Tabs (Ciprofloxacin hcl) .... Take 1 tablet by mouth once a day 6)  Canasa 1000 Mg Supp (Mesalamine) .... Insert 1 suppository into rectum every night 7)  Multivitamins Tabs (Multiple vitamin) .... Take 1 tablet by mouth once a day  Other Orders: Dermatology Referral ([facility]erm[facility])

## 2010-10-08 NOTE — Assessment & Plan Note (Signed)
Summary: DISABILITY CONSULT//AS.   History of Present Illness Visit Type: follow up Primary GI MD: [name] MD Primary Provider: Garnet Koyanagi, DO Chief Complaint: discuss disability paperwork History of Present Illness:   This is a 55 year old white female with ulcerative colitis; status post total colectomy and ileoanal pullthrough  in 1998. Her last office visit was in September 2009. Patient's last colonoscopies were in 2005 and in June 2009. Her last hemoglobin was 12.3 and her hematocrit was 35.6. 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 She has been unable to hold a job because of frequent stools and incontinence. She is applying for disability which I have supported.   GI Review of Systems    Reports abdominal pain, acid reflux, belching, heartburn, and  nausea.      Denies bloating, chest pain, dysphagia with liquids, dysphagia with solids, loss of appetite, vomiting, vomiting blood, weight loss, and  weight gain.        Denies anal fissure, black tarry stools, change in bowel habit, constipation, diarrhea, diverticulosis, fecal incontinence, heme positive stool, hemorrhoids, irritable bowel syndrome, jaundice, light color stool, liver problems, rectal bleeding, and  rectal pain.     Prior Medications Reviewed Using: Patient Recall  Updated Prior Medication List: CYMBALTA 60 MG  CPEP (DULOXETINE HCL) 2 by mouth qd VICODIN 5-500 MG  TABS (HYDROCODONE-ACETAMINOPHEN) Take 1 tablet by mouth every 4-6 hours as needed for pain PROTONIX 40 MG  TBEC (PANTOPRAZOLE SODIUM) 1 by mouth qd LOPRESSOR 50 MG  TABS (METOPROLOL TARTRATE) 1 by mouth once daily CIPRO 250 MG TABS (CIPROFLOXACIN HCL) Take 1 tablet by mouth once a day CANASA 1000 MG SUPP (MESALAMINE) Insert 1 suppository into rectum every night MULTIVITAMINS  TABS (MULTIPLE VITAMIN) Take 1 tablet by mouth once a day  Current Allergies (reviewed today): ! SULFA ! MORPHINE  Past  Medical History:    Reviewed history from 02/05/2008 and no changes required:       Current Problems:        SMALL BOWEL OBSTRUCTION, HX OF (ICD-V12.79)       OBESITY, UNSPECIFIED (ICD-278.00)       HYPERPARATHYROIDISM, HX OF (ICD-V12.2)       HYPOKALEMIA, HX OF (ICD-V12.2)       HYPERTENSION (ICD-401.9)       GASTRIC POLYP, HX OF (ICD-V12.79)       GERD (ICD-530.81)       DEPRESSION (ICD-311)       ANXIETY (ICD-300.00)       DIARRHEA (ICD-787.91)       ANEMIA (ICD-285.9)       RENAL INSUFFICIENCY (ICD-588.9)       ULCERATIVE COLITIS (ICD-556.9)         Past Surgical History:    Reviewed history from 02/05/2008 and no changes required:       Loop ileostomy closure        Anal dilation       Proctocolectomy and insertion of ileoanal J-Pouch with loop ileostomy       Cholecystectomy       Tubal Ligation       Total Abdominal Hysterectomy   Family History:    Reviewed history from 02/20/2008 and no changes required:       Family History of Heart Disease: Father       F--UC       D- UC       Granddaughters--- IBS       Family History of  Bowel disease  Social History:    Reviewed history from 02/05/2008 and no changes required:       Patient has never smoked.        Patient has 2 children       Alcohol Use - yes-on occasion       Illicit Drug Use - no       Occupation: Photographer    Review of Systems       diarrhea, incontinence, fatigue, abd. pain, rectal bleeding,wght loss   Vital Signs:  Patient Profile:   55 Years Old Female Height:     64.5 inches Weight:      151 pounds BMI:     25.61 BSA:     1.75 Pulse rate:   76 / minute Pulse rhythm:   regular BP sitting:   110 / 66  (left arm) Cuff size:   regular  Vitals Entered By: Abelino Derrick CMA (August 22, 2008 8:14 AM)                    Impression & Recommendations:  Problem # 1:  ULCERATIVE COLITIS (ICD-556.9) chronic diarrhea. This has been stable. Patient's pouchitis is partially  controlled on Cipro 250 mg twice a day. She takes Vicodin 5/500 for abdominal pain. Patient also takes Canasa suppositories for rectal irritation and bleeding.  Problem # 2:  OBESITY, UNSPECIFIED (ICD-278.00) patient has intentionally  lost about 20 pounds, she is no longer obese.  Problem # 3:  RENAL INSUFFICIENCY (ICD-588.9) followed by renal service.  Problem # 4:  ANXIETY (ICD-300.00) patient is taking Cymbalta and is followed by a psychologist.  Other Orders: TLB-CBC Platelet - w/Differential (85025-CBCD) TLB-IBC Pnl (Iron/FE;Transferrin) (83550-IBC)   Patient Instructions: 1)  continue Canasa suppositories 1000 mg q.h.s. 2)  Cipro 250 mg p.o. q.d. 3)  Protonix 40 mg p.o. q.d. 4)  CBC with iron studies today 5)  Office visit 3 months 6)  I will fill out disability forms for patient 7)  Copy Sent To:Dr. Lizbeth Bark    ]

## 2010-10-08 NOTE — Assessment & Plan Note (Signed)
Vital Signs:  Patient Profile:   55 Years Old Female Temp:     9.6 degrees F Pulse rate:   85 / minute Resp:     20 per minute BP sitting:   108 / 71                 Chief Complaint:  weakness.  History of Present Illness: here in [facility] ER with dizzy and weakness on chronic diuretic, with episode of syncope 5 days ago, more excercise recently this week and decreased by mouth intake recently trying to lose wt; found to have low sodium 126 in ER and much improved after 1 liter IVF's; no n/v, increased uti symptoms; on cipro for approx 4 wks for ongoing pouchitis which has not really been acting up lately; sometimes take flagyl for this as well but not in the past month; has regular f/u with nephrology - last seen late march    Updated Prior Medication List: CYMBALTA 60 MG  CPEP (DULOXETINE HCL) 2 by mouth qd TRIAMTERENE-HCTZ 37.5-25 MG  TABS (TRIAMTERENE-HCTZ) 1 qd * VICODEN PRN  PROTONIX 40 MG  TBEC (PANTOPRAZOLE SODIUM) 1 by mouth qd CIPROFLOXACIN HCL 500 MG  TABS (CIPROFLOXACIN HCL)  * BP MED STARTS WITH "N" - 50 MG PER DAY  HECTOROL 0.5 MCG  CAPS (DOXERCALCIFEROL) 1 by mouth qd  Current Allergies (reviewed today): ! SULFA ! 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       hx of partial SBO 2007       Anxiety       hyperparathyroidism - secondary  Past Surgical History:    Reviewed history from 11/16/2007 and no changes required:       colectomy - total       ileoproctostomy       loop ileostomy with reconnection 04-1997       Cholecystectomy  Hysterectomy       Oophorectomy   Family History:    Reviewed history and no changes required:       father and mother with heart disease       sister and uncle with DM       uncle with cancer  Social History:    Reviewed history and no changes required:       Married       2 children       housewife       Never Smoked       Alcohol use-no   Risk Factors:  Tobacco use:  never Alcohol use:  no   Review of Systems       all otherwise negative    Physical Exam  General:     mild ill appearing Head:     Normocephalic and atraumatic without obvious abnormalities. No apparent alopecia or balding. Eyes:     No corneal or  conjunctival inflammation noted. EOMI. Perrla. Ears:     External ear exam shows no significant lesions or deformities.  Otoscopic examination reveals clear canals, tympanic membranes are intact bilaterally without bulging, retraction, inflammation or discharge. Hearing is grossly normal bilaterally. Nose:     External nasal examination shows no deformity or inflammation. Nasal mucosa are pink and moist without lesions or exudates. Mouth:     Oral mucosa and oropharynx without lesions or exudates.  Teeth in good repair. Neck:     No deformities, masses, or tenderness noted. Lungs:     Normal respiratory effort, chest expands symmetrically. Lungs are clear to auscultation, no crackles or wheezes. Heart:     Normal rate and regular rhythm. S1 and S2 normal without gallop, murmur, click, rub or other extra sounds. Abdomen:     Bowel sounds positive,abdomen soft and non-tender without masses, organomegaly or hernias noted. Msk:     No deformity or scoliosis noted of thoracic or lumbar spine.   Extremities:     No clubbing, cyanosis, edema, or deformity noted with normal full range of motion of all joints.   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 1 qd  hold diuretic, also on another med starting with "N" per pt (not clear)_ - husband to find out name of med  Problem # 7:  HYPOKALEMIA (ICD-276.8) K 3.2 in ER - replace by mouth, f/u in AM   Problem # 8:  Preventive Health Care (ICD-V70.0) to add lovenox subcutaneously   Complete Medication List: 1)  Cymbalta 60 Mg Cpep (Duloxetine hcl) .... 2 by mouth qd 2)  Triamterene-hctz 37.5-25 Mg Tabs (Triamterene-hctz) .Marland Kitchen.. 1 qd 3)  Vicoden Prn  4)  Protonix 40 Mg Tbec (Pantoprazole sodium) .Marland Kitchen.. 1 by mouth qd 5)  Ciprofloxacin Hcl 500 Mg Tabs (Ciprofloxacin hcl) 6)  Bp Med Starts With "n" - 50 Mg Per Day  7)  Hectorol 0.5 Mcg Caps (Doxercalciferol) .Marland Kitchen.. 1 by mouth qd     ]

## 2010-10-08 NOTE — Letter (Signed)
Summary: Results Follow-up Letter  [facility]   [place], [place] [address]   Phone: [phone]  Fax: [phone]    02/06/2009       [name] [place], [place]  [address]  Dear Ms. Backs,   The following are the results of your recent test(s):  Test     Result     Pap Smear    Normal_______  Not Normal_____       Comments: _________________________________________________________ Cholesterol LDL(Bad cholesterol):          Your goal is less than:         HDL (Good cholesterol):        Your goal is more than: _________________________________________________________ Other Tests:   _________________________________________________________  Please call for an appointment Or _Please see attached labwork.________________________________________________________ _________________________________________________________ _________________________________________________________  Sincerely,  Carley Hammed [place] at [facility]

## 2010-10-08 NOTE — Letter (Signed)
Summary: Patient Notice- Colon Biospy Results  [facility]  [address] [place], [place] [address]   Phone: [phone]  Fax: [phone]        February 13, 2008 MRN: [id number]    [facility] [place], [place]  [address]    Dear [name],  I am pleased to inform you that the biopsies taken during your recent colonoscopy did not show any evidence of cancer upon pathologic examination.biopsies show pouchitis,with lot of inflammation  Additional information/recommendations:     _x_Continue with the treatment plan as outlined on the day of your      exam.We may have to rotate Your antibiotics a bit. Call if diarrhea flares up  bad again.  _x_You should have a repeat colonoscopy examination for this problem           in _3 years.  Please call us if you are having persistent problems or have questions about your condition that have not been fully answered at this time.  Sincerely,  Lafayette Dragon MD  This letter has been electronically signed by your physician.

## 2010-10-08 NOTE — Letter (Signed)
Summary: [facility]   Imported By: Edmonia James 10/22/2008 08:44:41  _____________________________________________________________________  External Attachment:    Type:   Image     Comment:   External Document

## 2010-10-08 NOTE — Progress Notes (Signed)
Summary: TRIAGE   Phone Note Call from Patient Call back at Home Phone [phone] Call back at Work Phone [phone]   Caller: Patient Call For: Olevia Perches Reason for Call: Talk to Nurse Summary of Call: Patient states that she cannot take iron that usually she is set up for an iron infusion  Initial call taken by: Ronalee Red,  September 11, 2009 4:30 PM  Follow-up for Phone Call        Last infusion was 03/2009. Dr.Auriel Kist please advise Follow-up by: Vivia Ewing LPN,  January  6, 624THL 4:41 PM  Additional Follow-up for Phone Call Additional follow up Details #1::        Iron is not low enough for iron infusion,  so she has to have her CBC rechecked again, as I suggested. Additional Follow-up by: Lafayette Dragon MD,  September 11, 2009 7:20 PM    Additional Follow-up for Phone Call Additional follow up Details #2::    Message left for pt. with above MD instructions. Lab order is in [place] for the week of 10-27-09. Pt. instructed to call back as needed.  Follow-up by: Vivia Ewing LPN,  January  7, 624THL 8:06 AM

## 2010-10-08 NOTE — Assessment & Plan Note (Signed)
Summary: FLU SHOT//PH   Nurse Visit  CC: Flu shot./kb   Allergies: 1)  ! Sulfa 2)  ! Morphine  Immunizations Administered:  Influenza Vaccine # 1:    Vaccine Type: Fluvax MCR    Site: left deltoid    Mfr: Gleed    Dose: 0.5 ml    Route: IM    Given by: Ernestene Mention CMA    Exp. Date: 03/06/2011    Lot #: [id number]    VIS given: 03/31/10 version given August 19, 2010.  Orders Added: 1)  Influenza Vaccine MCR [id number]

## 2010-10-08 NOTE — Letter (Signed)
Summary: Results Follow-up Letter  [facility] [place]   [place], [place] [address]   Phone: [phone]  Fax: [phone]    04/19/2008        [place], [place]  [address]  Dear Ms. Ritchey,   The following are the results of your recent test(s):  Test     Result     Pap Smear    Normal_______  Not Normal_____       Comments: _________________________________________________________ Cholesterol LDL(Bad cholesterol):          Your goal is less than:         HDL (Good cholesterol):        Your goal is more than: _________________________________________________________ Other Tests:   _________________________________________________________  Please call for an appointment Or _Please see attached lab report.________________________________________________________ _________________________________________________________ _________________________________________________________  Sincerely,  Carley Hammed [place] at [facility]

## 2010-10-08 NOTE — Progress Notes (Signed)
Summary: Refill Request  Phone Note Refill Request Message from:  Pharmacy on [facility] [address] Fax #: [phone]  Refills Requested: Medication #1:  VICODIN 5-500 MG  TABS Take 1 tablet by mouth every 4-6 hours as needed for pain   Dosage confirmed as above?Dosage Confirmed   Supply Requested: 1 month   Last Refilled: 10/27/2009 Next Appointment Scheduled: none Initial call taken by: Elna Breslow,  November 27, 2009 8:20 AM  Follow-up for Phone Call        last ov- 08/05/2009. [place]  November 27, 2009 8:39 AM   Additional Follow-up for Phone Call Additional follow up Details #1::        ok to refill x1 Additional Follow-up by: Garnet Koyanagi DO,  November 27, 2009 9:56 AM    Prescriptions: VICODIN 5-500 MG  TABS (HYDROCODONE-ACETAMINOPHEN) Take 1 tablet by mouth every 4-6 hours as needed for pain  #30 x 0   Entered by:   Allyn Kenner CMA   Authorized by:   Garnet Koyanagi DO   Signed by:   Allyn Kenner CMA on 11/27/2009   Method used:   Printed then faxed to ...       [facility] [phone]* (retail)       [address] [place], [place]  [address]       Ph: [phone] or [phone]       Fax: [phone]   RxID:   [id number]

## 2010-10-08 NOTE — Letter (Signed)
Summary: External Correspondence-Med. Source Statement  External Correspondence-Med. Source Statement   Imported By: Marisue Humble CMA 09/13/2008 15:21:42  _____________________________________________________________________  External Attachment:    Type:   Image     Comment:   External Document

## 2010-10-08 NOTE — Assessment & Plan Note (Signed)
Summary: POST HOSPITAL F/U      [facility]  Medications Added VICODIN 5-500 MG  TABS (HYDROCODONE-ACETAMINOPHEN) Take 1 tablet by mouth every 4-6 hours as needed for pain        History of Present Illness Visit Type: follow up Primary GI MD: [name] MD Primary Xyon [place]: Garnet Koyanagi, DO Chief Complaint: follow-up visit/Post hospital History of Present Illness:   patient is a 55 year old white female with ulcerative colitis, status post total colectomy and ileo- anal pull through in 1998. She has a chronic renal insufficiency, chronic diarrhea, and occasional pouchitis. Patient has been getting iron infusions for iron deficiency  anemia. She has been intolerant to oral iron. Patient has no specific complaints today. She has stable diarrhea, occasional left lower quadrant abdominal pain. She has  completed course of Cipro and Flagyl for pouchitis. Patient has been applying for disability             Prior Medications Reviewed Using: Patient Recall  Updated Prior Medication List: CYMBALTA 60 MG  CPEP (DULOXETINE HCL) 2 by mouth qd * VICODEN PRN  PROTONIX 40 MG  TBEC (PANTOPRAZOLE SODIUM) 1 by mouth qd CIPROFLOXACIN HCL 500 MG  TABS (CIPROFLOXACIN HCL) Take 1 tablet by mouth once a day * BP MED STARTS WITH "N" - 50 MG PER DAY   Current Allergies (reviewed today): ! SULFA ! 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5 inches Weight:      177.13 pounds BMI:     30.04 Pulse rate:   60 / minute BP sitting:   118 / 78                  Physical Exam  General:     Well developed, well nourished, no acute distress. Neck:     Supple; no masses or thyromegaly. Lungs:     Clear throughout to auscultation.  Heart:     Regular rate and rhythm; no murmurs, rubs,  or bruits. Abdomen:     obese abdomen, soft, mild tenderness in left lower quadrant, normal active bowel sounds, well-healed surgical scar in lower abdomen    Impression & Recommendations:  Problem # 1:  ULCERATIVE COLITIS (ICD-556.9)  ulcerative colitis in remission, status post total colectomy and ileostomy,  ileal anal pull through in 1998. Patient is stable from a standpoint of diarrhea and abdominal pain. She is due for flexible sigmoidoscopy. Last exam was done in 2005 and showed mild stricture at the anastomosis. She also needs refill on her Vicodin for l low abdominal pain and control of diarrhea. She uses it very rarely. Patient will be due for iron infusion in the next several months. Currently, her hemoglobin being 11 Orders: Flex with Sedation (Flex w/Sed)   Problem # 2:  ANXIETY (ICD-300.00) patient has chronic depression and anxiety which is being managed by psychotropic medications.  Problem # 3:  RENAL INSUFFICIENCY (ICD-588.9) renal insufficiency is being followed by renal service. She's been seen every 3 months.   Patient Instructions: 1)  flexible  sigmoidoscopy scheduled using fleets enema prep. 2)  Refill on Vicodin 5/500. 3)  Recheck H&H every 3 months and decide if patient needs a IV iron infusion. She is intolerant to oral iron 4)  Copy Sent To:Dr Lowne, Dr Servando Salina    Prescriptions: VICODIN 5-500 MG  TABS (HYDROCODONE-ACETAMINOPHEN) Take 1 tablet by mouth every 4-6 hours as needed for pain  #20 x 0   Entered by:   Awilda Bill CMA   Authorized by:   Lafayette Dragon MD   Signed by:   Awilda Bill CMA on 02/06/2008   Method used:   Printed then faxed to ...       [facility] [phone]*       [place], [place]  [address]       Ph: [phone] or [phone]       Fax: [phone]   RxID:   [id number] VICODIN 5-500 MG  TABS (HYDROCODONE-ACETAMINOPHEN) Take 1 tablet by mouth every 4-6 hours as needed for pain  #20 x 0   Entered by:   Awilda Bill CMA   Authorized by:   Lafayette Dragon MD   Signed by:   Awilda Bill CMA on 02/06/2008   Method used:   Electronically sent to ...       [facility] [phone]*       [address] [place], [place]  [address]       Ph: [phone] or [phone]       Fax: [phone]   RxID:   [id number]  Sent rx. electronically in error.  Will send via fax. Awilda Bill CMA  February 06, 2008 9:20 AM

## 2010-10-08 NOTE — Progress Notes (Signed)
Summary: refill  Phone Note Refill Request Message from:  Fax from Pharmacy on October 27, 2009 11:16 AM  Refills Requested: Medication #1:  VICODIN 5-500 MG  TABS Take 1 tablet by mouth every 4-6 hours as needed for pain [facility]  [address] fax [phone]   Method Requested: Fax to Local Pharmacy Next Appointment Scheduled: no appt Initial call taken by: Silva Bandy,  October 27, 2009 11:16 AM  Follow-up for Phone Call        last ov and last filled- 08/05/2009. [place]  October 27, 2009 11:53 AM   Additional Follow-up for Phone Call Additional follow up Details #1::        ok to refill x1 Additional Follow-up by: Garnet Koyanagi DO,  October 27, 2009 12:05 PM    Prescriptions: VICODIN 5-500 MG  TABS (HYDROCODONE-ACETAMINOPHEN) Take 1 tablet by mouth every 4-6 hours as needed for pain  #30 x 0   Entered by:   Allyn Kenner CMA   Authorized by:   Garnet Koyanagi DO   Signed by:   Allyn Kenner CMA on 10/27/2009   Method used:   Printed then faxed to ...       [facility] [phone]* (retail)       [address] [place], [place]  [address]       Ph: [phone] or [phone]       Fax: [phone]   RxID:   [id number]

## 2010-10-08 NOTE — Assessment & Plan Note (Signed)
Summary: thumb pain,noise in ear.cbs   Vital Signs:  Patient Profile:   55 Years Old Female Weight:      210.2 pounds Pulse rate:   94 / minute Pulse rhythm:   regular BP sitting:   110 / 60  (left arm) Cuff size:   large  Vitals Entered By: Dawson Bills (May 25, 2007 7:58 AM)               Audiometry Screening        Left  500 hz: 20db 1000 hz: 20db 2000 hz: 25db 4000 hz: 40db Right  500 hz: 20db 1000 hz: 20db 2000 hz: 20db 4000 hz: 35 db Audiometry Comment: left 6000 40db, 8000 db rt 6000 55db, 8000 db   Hearing Testing Entered By: Dawson Bills (May 25, 2007 8:31 AM)   Chief Complaint:  c/o hands hurting x >year; c/o hearing noises in her ears x long time ?ringing? no pain.  History of Present Illness: Ms. Mathieu is a 55 year old female with two complaints today.  First, she complains of wrist pain for several years.  Over the last several months she had difficulties holding objects or opening up the doors.  Patient was laid off her job in early August and she was hoping that her symptoms would improve.  She does admit that is better, but it continues to bother her.  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  Patient also reports that she has a long history of ringing in her ears.  She denies any hearing loss that she's aware of.  She denies any pain.  Patient will also like her sugar checked because it was noted to be elevated at her job.  Patient does not recall she was fasting when it was checked.  Currently she is fasting.  Current Allergies (reviewed today): ! SULFA ! MORPHINE Updated/Current Medications (including changes made in today's visit):  CYMBALTA 60 MG  CPEP (DULOXETINE HCL) 1 1/2 qd TRIAMTERENE-HCTZ 37.5-25 MG  TABS (TRIAMTERENE-HCTZ) 1 qd * VICODEN PRN  METOPROLOL SUCCINATE 25 MG  TB24 (METOPROLOL SUCCINATE) 1 by mouth qd CLORAZEPATE DIPOTASSIUM 15 MG  TABS (CLORAZEPATE DIPOTASSIUM) 1 by mouth three times a day  prn       Physical Exam  General:     alert.   Ears:     External ear exam shows no significant lesions or deformities.  Otoscopic examination reveals clear canals, tympanic membranes are intact bilaterally without bulging, retraction, inflammation or discharge. Hearing is grossly normal bilaterally. Extremities:     examination of the wrist bilaterally is significant for no obvious swelling or deformities.  Strength is 5 out of 5 with good range of motion.  Unable to elicit discomfort with the [facility] test bilaterally.no neurovascular compromise noted Neurologic:     nonfocal    Impression & Recommendations:  Problem # 1:  DE QUERVAIN'S TENOSYNOVITIS (ICD-727.04) patient information was provided in regards to cause, treatment, and anticipatory guidance. Prescription was written for a wrist/thumb spica splints. Patient may take Tylenol for anti-inflammatory effects. 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  Will refer to ENT for further recommendation  Orders: ENT Referral (ENT)   Complete Medication List: 1)  Cymbalta 60 Mg Cpep (Duloxetine hcl) .Marland Kitchen.. 1 1/2 qd 2)  Triamterene-hctz 37.5-25 Mg Tabs (Triamterene-hctz) .Marland Kitchen.. 1 qd 3)  Vicoden Prn  4)  Metoprolol Succinate 25 Mg Tb24 (Metoprolol  succinate) .Marland Kitchen.. 1 by mouth qd 5)  Clorazepate Dipotassium 15 Mg Tabs (Clorazepate dipotassium) .Marland Kitchen.. 1 by mouth three times a day prn  Other Orders: TLB-Glucose, QUANT ([id number])     ]

## 2010-10-08 NOTE — Progress Notes (Signed)
Summary: Refill Request-FYI  Phone Note Refill Request Call back at [phone] Message from:  Pharmacy on Jan 27, 2010 1:14 PM  Refills Requested: Medication #1:  VICODIN 5-500 MG  TABS Take 1 tablet by mouth every 4-6 hours as needed for pain   Dosage confirmed as above?Dosage Confirmed   Supply Requested: 1 month   Last Refilled: 12/29/2009 [facility] on [address] in [facility]  Next Appointment Scheduled: none Initial call taken by: Elna Breslow,  Jan 27, 2010 1:14 PM  Follow-up for Phone Call        last ov- 08/05/2009. Silvana  Jan 27, 2010 1:18 PM   Additional Follow-up for Phone Call Additional follow up Details #1::        Is still for bursitis in hip?  If yes she should see ortho. Additional Follow-up by: Garnet Koyanagi DO,  Jan 27, 2010 1:32 PM  New Problems: HIP PAIN [phone])   Additional Follow-up for Phone Call Additional follow up Details #2::    Left message to call back. [place]  Jan 27, 2010 1:46 PM   Additional Follow-up for Phone Call Additional follow up Details #3:: Details for Additional Follow-up Action Taken: Pt states that she is using it for her hip and for the pain in her abdomen and she cannot sleep at night. Pt has appt with Dr.[name] on the 5th. Allyn Kenner CMA  Jan 27, 2010 4:56 PM  Explain to pt it is not good to cover up abd pain with vicodin---she needs to let Dr Olevia Perches know she is taking Vicocin for abd pain---- we will refer to ortho for her hip pain.  ---- refill vicodin x1 but she should not take it for abd pain.  yrlowne 01/28/2010  1015a   Pt states that Dr.[name] knows that she is taking this medicaiton for her abd pain.Allyn Kenner CMA  Jan 28, 2010 10:18 AM   New Problems: HIP PAIN [phone]) Prescriptions: VICODIN 5-500 MG  TABS (HYDROCODONE-ACETAMINOPHEN) Take 1 tablet by mouth every 4-6 hours as needed for pain  #30 x 0   Entered by:   Allyn Kenner CMA   Authorized by:   Garnet Koyanagi DO  Signed by:   Allyn Kenner CMA on 01/28/2010   Method used:   Printed then faxed to ...       [facility] [phone]* (retail)       [address] [place], [place]  [address]       Ph: [phone] or [phone]       Fax: [phone]   RxID:   [id number]

## 2010-10-08 NOTE — Consult Note (Signed)
Summary: [facility]   Imported By: Edmonia James 12/26/2008 10:19:00  _____________________________________________________________________  External Attachment:    Type:   Image     Comment:   External Document

## 2010-10-08 NOTE — Letter (Signed)
Summary: [facility]   Imported By: Edmonia James 06/10/2009 11:10:34  _____________________________________________________________________  External Attachment:    Type:   Image     Comment:   External Document

## 2010-10-08 NOTE — Procedures (Signed)
Summary: COLON  Gastroenterology COLON   Imported By: Awilda Bill CMA 02/05/2008 16:13:02  _____________________________________________________________________  External Attachment:    Type:   Image     Comment:   External Document

## 2010-10-08 NOTE — Letter (Signed)
Summary: [facility]   Imported By: Edmonia James 12/17/2008 10:36:42  _____________________________________________________________________  External Attachment:    Type:   Image     Comment:   External Document

## 2010-10-08 NOTE — Progress Notes (Signed)
  Phone Note Outgoing Call Call back at [facility] Phone [phone]   Call placed by: Carley Hammed,  May 29, 2007 8:02 AM Call placed to: Patient Summary of Call: Pt. aware ...................................................................Carley Hammed  May 29, 2007 8:03 AM

## 2010-10-08 NOTE — Letter (Signed)
Summary: Results Follow-up Letter  [facility]   [place], [place] [address]   Phone: [phone]  Fax: [phone]    10/17/2007        [address], [place]  [address]  Dear Ms. Lafrance,   The following are the results of your recent test(s):  Test     Result     Pap Smear    Normal_______  Not Normal_____       Comments: _________________________________________________________ Cholesterol LDL(Bad cholesterol):          Your goal is less than:         HDL (Good cholesterol):        Your goal is more than: _________________________________________________________ Other Tests:   _________________________________________________________  Please call for an appointment Or Please see attached._________________________________________________________ _________________________________________________________ _________________________________________________________  Sincerely,  Carley Hammed Anita at [facility]

## 2010-10-08 NOTE — Progress Notes (Signed)
Summary: LABS   Phone Note Call from Patient Call back at Home Phone [phone]   Caller: Patient Call For: [name] Reason for Call: Talk to Nurse Details for Reason: LABS Summary of Call: WONDERING IF DR [name] WOULD LIKE LABS DRAWN BEFORE REV ON 6-2? PATIENT'S CHART HAS BEEN REQUESTED Initial call taken by: Quenton Fetter [facility],  Jan 30, 2008 4:10 PM  Follow-up for Phone Call        DR.[name]-PLEASE ADVISE. Vivia Ewing LPN  May 26, [id number] PM    Additional Follow-up for Phone Call Additional follow up Details #1::        chart reviewed, pt was hosp for dehydration in 4/09, finished Flagyl and Colestid. Hx UColitis, s/p Total colectomy  and ileoproctostomy with pouch.   Labs to be done before apptm: CBC with Diff, C-met, Fe TIBC, B12, Additional Follow-up by: Lafayette Dragon MD,  Jan 30, 2008 6:34 PM         Appended Document: LABS Labs in computer, pt. will have drawn this week.

## 2010-10-08 NOTE — Letter (Signed)
Summary: [facility]   Imported By: Velora Heckler 09/20/2007 11:26:03  _____________________________________________________________________  External Attachment:    Type:   Image     Comment:   External Document

## 2010-10-08 NOTE — Letter (Signed)
Summary: [facility]   Imported By: Rise Patience 08/03/2010 13:27:25  _____________________________________________________________________  External Attachment:    Type:   Image     Comment:   External Document

## 2010-10-08 NOTE — Op Note (Signed)
Summary: Excision of Anal Nodule, Soft tissue mass  NAME:  [name], [name]                   ACCOUNT NO.:  [id number]      MEDICAL RECORD NO.:  [id number]          PATIENT TYPE:  AMB      LOCATION:  DAY                          FACILITY:  [facility]      PHYSICIAN:  Odis Hollingshead, M.D.DATE OF BIRTH:  01/21/1956      DATE OF PROCEDURE:  11/15/2008   DATE OF DISCHARGE:                                  OPERATIVE REPORT      PREOPERATIVE DIAGNOSES:   1. Soft tissue mass left back.   2. Anal nodule.      POSTOPERATIVE DIAGNOSES:   1. Soft tissue mass left back.   2. Anal nodule.      PROCEDURE:   1. Excision of 4 cm left back soft tissue mass.   2. Excision of 1 cm anal nodule.      SURGEON:  Odis Hollingshead, M.D.      ANESTHESIA:  General, plus Marcaine local.      INDICATIONS:  A 55 year old female has been having a painful soft tissue   mass of the lower back, that is measured about 2 cm.  It is painful with   certain movements.  She also notes that she has what she feels is a   painful external hemorrhoid on her right side.  It is a very firm   nodule.  She now presents for excision.      TECHNIQUE:  She was brought to the operating room supine on the   stretcher, given general anesthetic.  She was then turned prone on the   operating table and appropriate areas of the body were padded.  The   buttocks were retracted with tape and the left lower back and anus were   sterilely prepped and draped with the 2 areas being draped separately.      I made an incision in the skin of the left lower back and dissected down   to the subcutaneous tissues, until I got close to   subfascial area.  Once I incised the fascia, a fairly large 4-cm   lipomatous  type mass was removed.  This was sent to pathology.   Following this I controlled bleeding with electrocautery.      Once bleeding was controlled, I then closed the wound in two layers   closing the subcutaneous tissue and fascia with  running 3-0 Monocryl   suture.  Then closing the skin with 3-0 Monocryl subcuticular stitch.   Steri-Strips and a sterile dressing were applied.      Next, I approached the anal nodule on the right side.  Using   electrocautery, I excised this nodule in a piecemeal fashion and sent to   pathology.  It was fairly friable.  I then controlled bleeding with   electrocautery and anesthetized the area with the 0.5% Marcaine with   epinephrine.  The area was hemostatic.  A bulky dressing was applied.      She subsequently was turned supine back on the  stretcher, extubated and   brought to the  recovery room  in satisfactory condition.  There were no   apparent complications.               Odis Hollingshead, M.D.   Electronically Signed            TJR/MEDQ  D:  11/15/2008  T:  11/15/2008  Job:  [id number]

## 2010-10-08 NOTE — Progress Notes (Signed)
Summary: passed out yesterday  Phone Note Call from Patient Call back at Home Phone [phone]   Caller: Patient Reason for Call: Acute Illness, Talk to Nurse Summary of Call: dr. Elmer Ramp patient of [facility] patient passed out yesterday, patients husband came home yesterday from a trip and she was giving him a hug and the next thing she knew she was laying on the bed. when she came back it took her a mintue to figure out that she passed out. husband told her that she was passed out for a couple seconds. patient says that here lately when she gets up and down she feels like she is going to pass out. when patient was sitting yesterday her blood pressure 141 / 92. she also took her blood pressure standing and it was normal but can not remember what the number was.  Initial call taken by: Larene Pickett,  December 11, 2007 10:21 AM  Follow-up for Phone Call        Guinda.....................................................................Marland KitchenAllen Norris  December 11, 2007 10:47 AM Follow-up by: Allen Norris,  December 11, 2007 10:47 AM  Additional Follow-up for Phone Call Additional follow up Details #1::        PER DR LOWNE,  PT NEED TO GO THE ER NOW.. PT SAYS SHE FELL OUT ON YESTERDAY..TOLD PT SHE SHOULD HAVE GONE WHEN IT HAPPENED.Marland KitchenPT AGREED-SAYS SHE WILL GO TODAY.....................................................................Marland KitchenAllen Norris  December 11, 2007 11:39 AM Additional Follow-up by: Allen Norris,  December 11, 2007 11:39 AM

## 2010-10-08 NOTE — Op Note (Signed)
Summary: [facility]  [facility]   Imported By: Edmonia James 03/31/2009 14:34:17  _____________________________________________________________________  External Attachment:    Type:   Image     Comment:   External Document

## 2010-10-08 NOTE — Assessment & Plan Note (Signed)
Summary: cpx,complete [place] [employer] forms,tb skin//fd   Vital Signs:  Patient profile:   55 year old female Height:      64.5 inches Weight:      155 pounds Temp:     98.4 degrees F oral Pulse rate:   74 / minute Resp:     18 per minute BP sitting:   110 / 72  (right arm)  Vitals Entered By: Carley Hammed (March 25, 2009 2:38 PM) CC: Fill out [place] form Is Patient Diabetic? No Pain Assessment Patient in pain? no       Have you ever been in a relationship where you felt threatened, hurt or afraid?No   Does patient need assistance? 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 Pt tetanus is UTD and needs PPD.  No other complaints.    Current Medications (verified): 1)  Cymbalta 60 Mg  Cpep (Duloxetine Hcl) .... 2 By Mouth Qd 2)  Vicodin 5-500 Mg  Tabs (Hydrocodone-Acetaminophen) .... Take 1 Tablet By Mouth Every 4-6 Hours As Needed For Pain 3)  Protonix 40 Mg  Tbec (Pantoprazole Sodium) .... Take 1 Tablet By Mouth Once A Day. Must Have Office Visit For Further Refills! 4)  Lopressor 50 Mg  Tabs (Metoprolol Tartrate) .Marland Kitchen.. 1 By Mouth Once Daily 5)  Canasa 1000 Mg Supp (Mesalamine) .... Insert 1 Suppository Into Rectum Every Night As Needed 6)  Multivitamins  Tabs (Multiple Vitamin) .... Take 1 Tablet By Mouth Once A Day 7)  Darvocet A500 100-500 Mg Tabs (Propoxyphene N-Apap) .Marland Kitchen.. 1 By Mouth Every 6 Hours As Needed  Allergies (verified): 1)  ! Sulfa 2)  ! Morphine  Past History:  Past medical, surgical, family and social histories (including risk factors) reviewed, and no changes noted (except as noted below).  Past Medical History: Reviewed history from 02/28/2009 and no changes required.   SMALL BOWEL OBSTRUCTION, HX OF (ICD-V12.79) OBESITY, UNSPECIFIED (ICD-278.00) HYPERPARATHYROIDISM, HX OF (ICD-V12.2) HYPOKALEMIA, HX OF  (ICD-V12.2) HYPERTENSION (ICD-401.9) GASTRIC POLYP, HX OF (ICD-V12.79) GERD (ICD-530.81) DEPRESSION (ICD-311) ANXIETY (ICD-300.00) DIARRHEA (ICD-787.91) ANEMIA (ICD-285.9) RENAL INSUFFICIENCY (ICD-588.9) ULCERATIVE COLITIS (ICD-556.9)  Past Surgical History: Reviewed history from 02/05/2008 and no changes required. Loop ileostomy closure  Anal dilation Proctocolectomy and insertion of ileoanal J-Pouch with loop ileostomy Cholecystectomy Tubal Ligation Total Abdominal Hysterectomy  Family History: Reviewed history from 02/28/2009 and no changes required. Family History of Heart Disease: Father F--UC D- UC Granddaughters--- IBS Family History of Bowel disease    allergies: mother heart disease: father   Social History: Reviewed history from 02/28/2009 and no changes required. Pt is married. Patient has 2 children. Alcohol Use - yes-on occasion Illicit Drug Use - no Occupation: Unemployed.  Previously worked for [employer] Patient states former smoker. Started at age 72.  1 ppd.  Quit at age 42  Review of Systems      See HPI General:  Denies chills, fatigue, fever, loss of appetite, malaise, sleep disorder, sweats, weakness, and weight loss. Eyes:  Denies blurring, discharge, double vision, eye irritation, eye pain, halos, itching, light sensitivity, red eye, vision loss-1 eye, and vision loss-both eyes. ENT:  Denies decreased hearing, difficulty swallowing, ear discharge, earache, hoarseness, nasal congestion, nosebleeds, postnasal drainage, ringing in ears, sinus pressure, and sore throat. CV:  Denies bluish discoloration of lips or nails, chest pain or discomfort, difficulty breathing at night, difficulty breathing while  lying down, fainting, fatigue, leg cramps with exertion, lightheadness, near fainting, palpitations, shortness of breath with exertion, swelling of feet, swelling of hands, and weight gain. Resp:  Denies chest discomfort, chest pain with inspiration,  cough, coughing up blood, excessive snoring, hypersomnolence, morning headaches, pleuritic, shortness of breath, sputum productive, and wheezing. GI:  Denies abdominal pain, bloody stools, change in bowel habits, constipation, dark tarry stools, diarrhea, excessive appetite, gas, hemorrhoids, indigestion, loss of appetite, nausea, vomiting, vomiting blood, and yellowish skin color. GU:  Denies abnormal vaginal bleeding, decreased libido, discharge, dysuria, genital sores, hematuria, incontinence, nocturia, urinary frequency, and urinary hesitancy. MS:  Denies joint pain, joint redness, joint swelling, loss of strength, low back pain, mid back pain, muscle aches, muscle , cramps, muscle weakness, stiffness, and thoracic pain. Derm:  Denies changes in color of skin, changes in nail beds, dryness, excessive perspiration, flushing, hair loss, insect bite(s), itching, lesion(s), poor wound healing, and rash. Neuro:  Denies brief paralysis, difficulty with concentration, disturbances in coordination, falling down, headaches, inability to speak, memory loss, numbness, poor balance, seizures, sensation of room spinning, tingling, tremors, visual disturbances, and weakness. Psych:  Denies alternate hallucination ( auditory/visual), anxiety, depression, easily angered, easily tearful, irritability, mental problems, panic attacks, sense of great danger, suicidal thoughts/plans, thoughts of violence, unusual visions or sounds, and thoughts /plans of harming others. Endo:  Denies cold intolerance, excessive hunger, excessive thirst, excessive urination, heat intolerance, polyuria, and weight change. Heme:  Denies abnormal bruising, bleeding, enlarge lymph nodes, fevers, pallor, and skin discoloration.  Physical Exam  General:  Well-developed,well-nourished,in no acute distress; alert,appropriate and cooperative throughout examination Head:  Normocephalic and atraumatic without obvious abnormalities. No apparent  alopecia or balding. Eyes:  No corneal or conjunctival inflammation noted. EOMI. Perrla. Funduscopic exam benign, without hemorrhages, exudates or papilledema. Vision grossly normal. Ears:  External ear exam shows no significant lesions or deformities.  Otoscopic examination reveals clear canals, tympanic membranes are intact bilaterally without bulging, retraction, inflammation or discharge. Hearing is grossly normal bilaterally. Nose:  External nasal examination shows no deformity or inflammation. Nasal mucosa are pink and moist without lesions or exudates. Mouth:  Oral mucosa and oropharynx without lesions or exudates.  Teeth in good repair. Neck:  No deformities, masses, or tenderness noted. Chest Wall:  No deformities, masses, or tenderness noted. Lungs:  Normal respiratory effort, chest expands symmetrically. Lungs are clear to auscultation, no crackles or wheezes. Heart:  Normal rate and regular rhythm. S1 and S2 normal without gallop, murmur, click, rub or other extra sounds. Abdomen:  Bowel sounds positive,abdomen soft and non-tender without masses, organomegaly or hernias noted. Msk:  normal ROM, no joint tenderness, no joint swelling, no joint warmth, no redness over joints, no joint deformities, no joint instability, and no crepitation.   Pulses:  R and L carotid,radial,femoral,dorsalis pedis and posterior tibial pulses are full and equal bilaterally Extremities:  No clubbing, cyanosis, edema, or deformity noted with normal full range of motion of all joints.   Neurologic:  No cranial nerve deficits noted. Station and gait are normal. Plantar reflexes are down-going bilaterally. DTRs are symmetrical throughout. Sensory, motor and coordinative functions appear intact. Skin:  Intact without suspicious lesions or rashes Cervical Nodes:  No lymphadenopathy noted Axillary Nodes:  No palpable lymphadenopathy Psych:  Oriented X3 and normally interactive.     Impression &  Recommendations:  Problem # 1:  HYPERTENSION (ICD-401.9)  Her updated medication list for this problem includes:    Lopressor 50 Mg Tabs (Metoprolol tartrate) .Marland KitchenMarland KitchenMarland KitchenMarland Kitchen  1 by mouth once daily  BP today: 110/72 Prior BP: 110/70 (02/28/2009)  Labs Reviewed: K+: 4.0 (03/14/2009) Creat: : 1.5 (03/14/2009)   Chol: 191 (10/16/2007)   HDL: 38.1 (10/16/2007)   LDL: 124 (10/16/2007)   TG: 142 (10/16/2007)  Problem # 2:  ULCERATIVE COLITIS (ICD-556.9) per GI  Problem # 3:  ANEMIA (ICD-285.9)  Hgb: 10.9 (03/14/2009)   Hct: 32.0 (03/14/2009)   Platelets: 366.0 (03/14/2009) RBC: 3.45 (03/14/2009)   RDW: 12.8 (03/14/2009)   WBC: 7.6 (03/14/2009) MCV: 92.8 (03/14/2009)   MCHC: 34.1 (03/14/2009) Ferritin: 260.8 (03/14/2009) Iron: 36 (03/14/2009)   % Sat: 13.7 (03/14/2009) B12: 286 (02/02/2008)   TSH: 2.20 (02/04/2009)  Problem # 4:  TOBACCO ABUSE, HX OF (ICD-V15.82)  Problem # 5:  HYPOKALEMIA, HX OF (ICD-V12.2)  Complete Medication List: 1)  Cymbalta 60 Mg Cpep (Duloxetine hcl) .... 2 by mouth qd 2)  Vicodin 5-500 Mg Tabs (Hydrocodone-acetaminophen) .... Take 1 tablet by mouth every 4-6 hours as needed for pain 3)  Protonix 40 Mg Tbec (Pantoprazole sodium) .... Take 1 tablet by mouth once a day. must have office visit for further refills! 4)  Lopressor 50 Mg Tabs (Metoprolol tartrate) .Marland Kitchen.. 1 by mouth once daily 5)  Canasa 1000 Mg Supp (Mesalamine) .... Insert 1 suppository into rectum every night as needed 6)  Multivitamins Tabs (Multiple vitamin) .... Take 1 tablet by mouth once a day 7)  Darvocet A500 100-500 Mg Tabs (Propoxyphene n-apap) .Marland Kitchen.. 1 by mouth every 6 hours as needed  Other Orders: TB Skin Test (123456)   PPD Application    Vaccine Type: PPD    Site: right forearm    Mfr: [place]    Dose: 0.05 ml    Route: ID    Given by: Carley Hammed    Exp. Date: 10/02/2011    Lot #: [id number]  PPD Results    Date of reading: 03/27/2009    Results: < 4mm    Interpretation:  negative

## 2010-10-08 NOTE — Assessment & Plan Note (Signed)
Summary: med check//tl   Vital Signs:  Patient Profile:   55 Years Old Female Weight:      202.50 pounds Temp:     98.1 degrees F oral Pulse rate:   72 / minute Resp:     16 per minute BP sitting:   100 / 70  (right arm)  Pt. in pain?   no  Vitals Entered By: Carley Hammed (October 16, 2007 11:25 AM)                  Chief Complaint:  Med check.  History of Present Illness: Dr Acie Fredrickson  started her on metoprolol in December   and increased to  50 mg daily in January 2009.  Also taking Triamterene/HCT.  No side effects.  The patient also has a h/o GERD.  Was previously on Protonix, but was changes to another medication which was less expensive. Cannot recall the name, but will need refills   Current Allergies: ! SULFA ! MORPHINE  Past Medical History:    Reviewed history from 10/23/2006 and no changes required:       Depression       GERD       Hyperlipidemia       Hypertension      Physical Exam  General:     Well-developed,well-nourished,in no acute distress; alert,appropriate and cooperative throughout examination Neck:     No deformities, masses, or tenderness noted. Lungs:     Normal respiratory effort, chest expands symmetrically. Lungs are clear to auscultation, no crackles or wheezes. Heart:     Normal rate and regular rhythm. S1 and S2 normal without gallop, murmur, click, rub or other extra sounds. Pulses:     R and L carotid,radial,dorsalis pedis and posterior tibial pulses are full and equal bilaterally Extremities:     No clubbing, cyanosis, edema, or deformity noted with normal full range of motion of all joints.   Psych:     Oriented X3, normally interactive, good eye contact, not anxious appearing, and not depressed appearing.      Impression & Recommendations:  Problem # 1:  HYPERTENSION (ICD-401.9) Currently being followed by cardiology, but she needs refill on the Triamterene/HCT.  Her updated medication list for this problem  includes:    Triamterene-hctz 37.5-25 Mg Tabs (Triamterene-hctz) .Marland Kitchen... 1 qd    Metoprolol Succinate 25 Mg Tb24 (Metoprolol succinate) .Marland Kitchen... 1 by mouth qd  Orders: Venipuncture (16073) TLB-BMP (Basic Metabolic Panel-BMET) (71062-[id number]) TLB-Lipid Panel (80061-LIPID)   Problem # 2:  GERD (ICD-530.81) Stable Patient will call with medication so it can be refilled Her updated medication list for this problem includes:    Eql Omeprazole 20 Mg Tbec (Omeprazole)   Problem # 3:  DEPRESSION (ICD-311) Followed by Psychiatry Her updated medication list for this problem includes:    Cymbalta 60 Mg Cpep (Duloxetine hcl) .Marland Kitchen... 1 1/2 qd   Complete Medication List: 1)  Cymbalta 60 Mg Cpep (Duloxetine hcl) .Marland Kitchen.. 1 1/2 qd 2)  Triamterene-hctz 37.5-25 Mg Tabs (Triamterene-hctz) .Marland Kitchen.. 1 qd 3)  Vicoden Prn  4)  Metoprolol Succinate 25 Mg Tb24 (Metoprolol succinate) .Marland Kitchen.. 1 by mouth qd 5)  Eql Omeprazole 20 Mg Tbec (Omeprazole)     Prescriptions: TRIAMTERENE-HCTZ 37.5-25 MG  TABS (TRIAMTERENE-HCTZ) 1 qd  #90 x 3   Entered and Authorized by:   Leone Haven MD   Signed by:   Leone Haven MD on 10/16/2007   Method used:   Print then Give to Patient  RxID:   [id number]  ]

## 2010-10-08 NOTE — Letter (Signed)
Summary: N/S for [facility]  [place], [place] [address]   Phone: [phone]  Fax: [phone]                September 08, 2009 MRN: [id number]    [facility] [place] [place], [place]  [address]    Dear Ms. Pate,   We have called and asked that you come for your labs scheduled for August 18, 2009 as was recommended for you by Dr [name]. It is our understanding that unfortunately, you still have not come to have those labs drawn. It is veryimportant that you do have these tests done as failure to do so could be compromising your health. We hope that you allow [place] to participate in your health care needs. Please contact us [phone] at your earliest convenience to reschedule your labwork.  Sincerely,    Dottie Nelson,CMA (AAMA)

## 2010-10-08 NOTE — Letter (Signed)
Summary: [facility]   Imported By: Bubba Hales 09/11/2009 10:26:20  _____________________________________________________________________  External Attachment:    Type:   Image     Comment:   External Document

## 2010-10-08 NOTE — Letter (Signed)
Summary: [facility]   Imported By: Phillis Knack 12/03/2008 14:52:45  _____________________________________________________________________  External Attachment:    Type:   Image     Comment:   External Document

## 2010-10-08 NOTE — Assessment & Plan Note (Signed)
Summary: FOR RING WORM AND RIGH ELBOWPAIN--PH   Vital Signs:  Patient Profile:   55 Years Old Female Height:     64.5 inches Weight:      163 pounds Temp:     98.2 degrees F oral Pulse rate:   68 / minute Resp:     18 per minute BP sitting:   140 / 90  (right arm)  Pt. in pain?   no  Vitals Entered By: Carley Hammed (April 18, 2008 9:55 AM)                  PCP:  Garnet Koyanagi, DO  Chief Complaint:  ring worm on back of left hand and right elbow pain.  History of Present Illness: Pt here c/o ring worm on back of left hand.  She has been using OTC cream with no relief.  It s been there for a few weeks.   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  Pt is left handed.  Other than pool pt can't think of anything else that is causing it.   Pt would also like hgb checked because she has been feeling tired and Dr Olevia Perches gives her iron infusions when it runs low.    Current Allergies: ! SULFA ! MORPHINE ! SULFA  Past Medical History:    Reviewed history from 02/05/2008 and no changes required:       Current Problems:        SMALL BOWEL OBSTRUCTION, HX OF (ICD-V12.79)       OBESITY, UNSPECIFIED (ICD-278.00)       HYPERPARATHYROIDISM, HX OF (ICD-V12.2)       HYPOKALEMIA, HX OF (ICD-V12.2)       HYPERTENSION (ICD-401.9)       GASTRIC POLYP, HX OF (ICD-V12.79)       GERD (ICD-530.81)       DEPRESSION (ICD-311)       ANXIETY (ICD-300.00)       DIARRHEA (ICD-787.91)       ANEMIA (ICD-285.9)       RENAL INSUFFICIENCY (ICD-588.9)       ULCERATIVE COLITIS (ICD-556.9)         Past Surgical History:    Reviewed history from 02/05/2008 and no changes required:       Loop ileostomy closure        Anal dilation       Proctocolectomy and 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        Patient has 2 children       Alcohol Use - yes-on occasion       Illicit Drug Use - no       Occupation: Photographer    Review of Systems  See HPI   Physical Exam  General:     Well-developed,well-nourished,in no acute distress; alert,appropriate and cooperative throughout examination Extremities:     L elbow---  + tenderness to palpation of lateral epi no swelling or errythema Skin:     L hand--  + circular rash c/w Tinea Psych:     Oriented X3, memory intact for recent and remote, and normally interactive.      Impression & Recommendations:  Problem # 1:  LATERAL EPICONDYLITIS, RIGHT (ICD-726.32) tylenol tennis elbow strap ice, rest ortho if no better  Problem # 2:  TINEA CORPORIS (ICD-110.5) L hand--- naftin once daily   Complete Medication List: 1)  Cymbalta 60 Mg Cpep (Duloxetine hcl) .... 2 by mouth qd 2)  Vicodin 5-500 Mg Tabs (Hydrocodone-acetaminophen) .... Take 1 tablet by mouth every 4-6 hours as needed for pain 3)  Protonix 40 Mg Tbec (Pantoprazole sodium) .Marland Kitchen.. 1 by mouth qd 4)  Lopressor 50 Mg Tabs (Metoprolol tartrate) .Marland Kitchen.. 1 by mouth once daily 5)  Naftin 1 % Crea (Naftifine hcl) .... Apply once daily  Other Orders: Venipuncture (88110) TLB-CBC Platelet - w/Differential ([id number])    Prescriptions: NAFTIN 1 %  CREA (NAFTIFINE HCL) apply once daily  #30 g x 1   Entered and Authorized by:   Garnet Koyanagi DO   Signed by:   Garnet Koyanagi DO on 04/18/2008   Method used:   Electronically sent to ...       [facility] [phone]*       [address] [place], [place]   [address]       Ph: [phone] or [phone]       Fax: [phone]   RxID:   [id number]  ]

## 2010-10-08 NOTE — Progress Notes (Signed)
Summary: refill  Phone Note Refill Request Message from:  Patient on Jan 28, 2010 1:31 PM  Refills Requested: Medication #1:  VICODIN 5-500 MG  TABS Take 1 tablet by mouth every 4-6 hours as needed for pain fax from [facility] -fax [phone] -ph [phone]   Method Requested: Fax to [facility] Initial call taken by: Arbie Cookey [place],  Jan 28, 2010 1:47 PM  Follow-up for Phone Call        this is a duplicate request Follow-up by: Garnet Koyanagi DO,  Jan 28, 2010 2:13 PM

## 2010-10-08 NOTE — Progress Notes (Signed)
Summary: IRON INFUSION    Phone Note Outgoing Call   Call placed by: Vivia Ewing LPN,  March 17, 3084 69:43 PM Call placed to: Patient Summary of Call: labs and MD order from 03-14-09 reviewed with pt. She is scheduled for an Iron Infusion at [facility] on 03-26-09 at 9:30am. Pt. instructed to call back as needed.  Initial call taken by: Vivia Ewing LPN,  March 17, 7004 25:91 PM

## 2010-10-08 NOTE — Assessment & Plan Note (Signed)
Summary: talk about DIsability paper--PH   Vital Signs:  Patient Profile:   55 Years Old Female Height:     64.5 inches Weight:      173.25 pounds Temp:     98.0 degrees F oral Pulse rate:   68 / minute Resp:     18 per minute BP sitting:   122 / 80  (right arm)  Pt. in pain?   no  Vitals Entered By: Carley Hammed (February 20, 2008 11:06 AM)                  PCP:  Garnet Koyanagi, DO  Chief Complaint:  Talk about disability papers..  History of Present Illness: Pt here to discuss disability.  Pt was laid off last August.  Pt states she had discussed this with Dr Cletus Gash who had suggested she go on disability but she never wanted it until now.  Pt can not stand long periods or sit.  Pt has a j pouch.  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      Current Allergies: ! SULFA ! MORPHINE ! SULFA  Past Medical History:    Reviewed history from 02/05/2008 and no changes required:       Current Problems:        SMALL BOWEL OBSTRUCTION, HX OF (ICD-V12.79)       OBESITY, UNSPECIFIED (ICD-278.00)       HYPERPARATHYROIDISM, HX OF (ICD-V12.2)       HYPOKALEMIA, HX OF (ICD-V12.2)       HYPERTENSION (ICD-401.9)       GASTRIC POLYP, HX OF (ICD-V12.79)       GERD (ICD-530.81)       DEPRESSION (ICD-311)       ANXIETY (ICD-300.00)       DIARRHEA (ICD-787.91)       ANEMIA (ICD-285.9)       RENAL INSUFFICIENCY (ICD-588.9)       ULCERATIVE COLITIS (ICD-556.9)         Past Surgical History:    Reviewed history from 02/05/2008 and no changes required:       Loop ileostomy closure        Anal dilation       Proctocolectomy and insertion of ileoanal J-Pouch with loop ileostomy       Cholecystectomy       Tubal Ligation       Total Abdominal Hysterectomy   Family History:    Reviewed history from 02/05/2008 and no changes  required:       Family History of Heart Disease: Father       F--UC       D- UC       Granddaughters--- IBS       Family History of Bowel disease  Social History:    Reviewed history from 02/05/2008 and no changes required:       Patient has never smoked.        Patient has 2 children       Alcohol Use - yes-on occasion       Illicit Drug Use - no       Occupation: Photographer    Review of Systems      See HPI   Physical Exam  General:     Well-developed,well-nourished,in no acute distress; alert,appropriate and  cooperative throughout examination Psych:     Oriented X3, memory intact for recent and remote, normally interactive, good eye contact, and tearful.      Impression & Recommendations:  Problem # 1:  ULCERATIVE COLITIS (ICD-556.9) per GI Pt will talk to DR Olevia Perches and psych about disability-- She will also get me a copy of [employer]. records for me to review.  Pt states she discussed disability with Dr Cletus Gash in past.    Problem # 2:  SMALL BOWEL OBSTRUCTION, HX OF (ICD-V12.79) per GI  Problem # 3:  HYPERTENSION (ICD-401.9)  BP today: 122/80 Prior BP: 118/78 (02/06/2008)  Labs Reviewed: Creat: 1.9 (02/02/2008) Chol: 191 (10/16/2007)   HDL: 38.1 (10/16/2007)   LDL: 124 (10/16/2007)   TG: 142 (10/16/2007)   Problem # 4:  ANXIETY (ICD-300.00)  Her updated medication list for this problem includes:    Cymbalta 60 Mg Cpep (Duloxetine hcl) .Marland Kitchen... 2 by mouth qd   Problem # 5:  DEPRESSION (ICD-311)  Her updated medication list for this problem includes:    Cymbalta 60 Mg Cpep (Duloxetine hcl) .Marland Kitchen... 2 by mouth qd   Complete Medication List: 1)  Cymbalta 60 Mg Cpep (Duloxetine hcl) .... 2 by mouth qd 2)  Vicodin 5-500 Mg Tabs (Hydrocodone-acetaminophen) .... Take 1 tablet by mouth every 4-6 hours as needed for pain 3)  Protonix 40 Mg Tbec (Pantoprazole sodium) .Marland Kitchen.. 1 by mouth qd 4)  Ciprofloxacin Hcl 500 Mg Tabs (Ciprofloxacin hcl) .... Take 1  tablet by mouth once a day 5)  Bp Med Starts With "n" - 50 Mg Per Day     ]

## 2010-10-08 NOTE — Progress Notes (Signed)
Summary: congested sore throat- dr Cletus Gash  Phone Note Call from Patient Call back at Home Phone [phone]   Caller: Mom Summary of Call: patient is congested, coughing & sore throat x"s a few days- no appts available Initial call taken by: Arbie Cookey [place],  July 25, 2007 9:31 AM  Follow-up for Phone Call        spoke with pt said not exactly a sore throat just real tender, had a iron infusion last week at [facility],said side effects could be flu like sx, said her sx started like inner ear then throat itchy, now congestion informed pt can offer appt tomorrow am or [facility] urgent [facility] today, pt said 1 more day wont hurt sched ov for am Follow-up by: Verdie Mosher,  July 25, 2007 10:05 AM

## 2010-10-08 NOTE — Miscellaneous (Signed)
Summary: Pantoprazole Refills  Clinical Lists Changes  Medications: Changed medication from PROTONIX 40 MG  TBEC (PANTOPRAZOLE SODIUM) Take 1 tablet by mouth once a day. MUST HAVE OFFICE VISIT FOR FURTHER REFILLS! to PROTONIX 40 MG  TBEC (PANTOPRAZOLE SODIUM) Take 1 tablet by mouth once a day. - Signed Rx of PROTONIX 40 MG  TBEC (PANTOPRAZOLE SODIUM) Take 1 tablet by mouth once a day.;  #90 x 2;  Signed;  Entered by: Awilda Bill CMA (AAMA);  Authorized by: Lafayette Dragon MD;  Method used: Electronically to [facility] [phone]*, [address] [place], [place], [place]  [address], Ph: [phone] or [phone], Fax: [phone]    Prescriptions: PROTONIX 40 MG  TBEC (PANTOPRAZOLE SODIUM) Take 1 tablet by mouth once a day.  #90 x 2   Entered by:   Awilda Bill CMA ([place])   Authorized by:   Lafayette Dragon MD   Signed by:   Awilda Bill CMA ([place]) on 07/08/2009   Method used:   Electronically to        [facility]. [phone]* (retail)       [address] [place],   [address]       Ph: [phone] or [phone]       Fax: [phone]   RxID:   [id number]

## 2010-10-08 NOTE — Progress Notes (Signed)
Summary: iron infusion   Phone Note Call from Patient Call back at Home Phone [phone]   Caller: Patient Call For: Dr. Olevia Perches Reason for Call: Talk to Nurse Summary of Call: feeling very tired and thinks it's time for her to have an iron infusion Initial call taken by: Lucien Mons,  April 29, 2010 9:39 AM  Follow-up for Phone Call        Patient  feeling tired and feels her iron or Hgb may be low.  I have reviewed office note from 02/09/10 and patient to have iron studies and cbc q 3 months.  Patient  will come by one day this week for lab work.  Patient  advised we will have Dr Olevia Perches review the labs once we have the results and advise. Follow-up by: Barb Merino RN, [place],  April 29, 2010 9:45 AM  Additional Follow-up for Phone Call Additional follow up Details #1::        we need to get CBC and iron studies before makeing a decision whether she needs Iron infusion. Additional Follow-up by: Lafayette Dragon MD,  April 29, 2010 10:54 AM

## 2010-10-08 NOTE — Letter (Signed)
Summary: Handout Printed  Printed Handout:  - Wrist Tendonitis

## 2010-10-08 NOTE — Progress Notes (Signed)
Summary: refill request   Phone Note Call from Patient Call back at Work Phone [phone]   Caller: Patient Call For: Dr. Olevia Perches Reason for Call: Refill Medication Summary of Call: needs refill early for generic Vicodin... [facility]  on [address] in [place] Initial call taken by: Lucien Mons,  May 29, 2010 10:43 AM    Prescriptions: VICODIN 5-500 MG  TABS (HYDROCODONE-ACETAMINOPHEN) Take 1 tablet by mouth every 4-6 hours as needed for pain  #30 x 1   Entered by:   Madlyn Frankel CMA (AAMA)   Authorized by:   Lafayette Dragon MD   Signed by:   [place] (AAMA) on 05/29/2010   Method used:   Printed then faxed to ...       [facility] [phone]* (retail)       [address] [place], [place]  [address]       Ph: [phone] or [phone]       Fax: [phone]   RxID:   [id number]

## 2010-10-08 NOTE — Miscellaneous (Signed)
Summary: Orders Update  Clinical Lists Changes  Orders: Added new Service order of No Charge Patient Arrived (NCPA0) (NCPA0) - Signed

## 2010-10-08 NOTE — Assessment & Plan Note (Signed)
Summary: cough, throat tender, congestion/alr   Vital Signs:  Patient Profile:   55 Years Old Female Weight:      202.38 pounds Temp:     98.4 degrees F oral Pulse rate:   78 / minute Resp:     16 per minute BP sitting:   112 / 70  (left arm)  Pt. in pain?   no  Vitals Entered By: Carley Hammed (July 26, 2007 11:12 AM)                  Chief Complaint:  URI symptoms.  History of Present Illness:  URI Symptoms      This is a 55 year old woman who presents with URI symptoms.  The symptoms began duration > 3 days ago.  The patient reports nasal congestion, sore throat, dry cough, and earache.  The patient denies fever, dyspnea, wheezing, rash, vomiting, diarrhea, use of an antipyretic, and response to antipyretic.  Feel better today.  Current Allergies: ! SULFA ! MORPHINE      Physical Exam  General:     Well-developed,well-nourished,in no acute distress; alert,appropriate and cooperative throughout examination Ears:     External ear exam shows no significant lesions or deformities.  Otoscopic examination reveals clear canals, tympanic membranes are intact bilaterally without bulging, retraction, inflammation or discharge. Hearing is grossly normal bilaterally. Nose:     nasal dischargemucosal pallor.   Mouth:     o/p mildly erythematous Neck:     No deformities, masses, or tenderness noted. Lungs:     Normal respiratory effort, chest expands symmetrically. Lungs are clear to auscultation, no crackles or wheezes. Heart:     Normal rate and regular rhythm. S1 and S2 normal without gallop, murmur, click, rub or other extra sounds.    Impression & Recommendations:  Problem # 1:  URI (ICD-465.9) Instructed on symptomatic treatment. Call if symptoms persist or worsen.   Zpak if no improvement by Friday/Sat F/u if symptoms worsen Magic Mouthwash  as directed and OTC Mucinex DM  Complete Medication List: 1)  Cymbalta 60 Mg Cpep (Duloxetine hcl) .Marland Kitchen.. 1 1/2  qd 2)  Triamterene-hctz 37.5-25 Mg Tabs (Triamterene-hctz) .Marland Kitchen.. 1 qd 3)  Vicoden Prn  4)  Metoprolol Succinate 25 Mg Tb24 (Metoprolol succinate) .Marland Kitchen.. 1 by mouth qd 5)  Zithromax Z-pak 250 Mg Tabs (Azithromycin) .... As directed. don't fill after 08/06/2007 6)  Magic Mouthwash  .... 1 tsp swish and spit qid prn  Other Orders: Rapid Strep (84696)     Prescriptions: MAGIC MOUTHWASH 1 tsp swish and spit QID prn  #12 oz x 0   Entered and Authorized by:   Leone Haven MD   Signed by:   Leone Haven MD on 07/26/2007   Method used:   Print then Give to Patient   RxID:   [id number] ZITHROMAX Z-PAK 250 MG  TABS (AZITHROMYCIN) as directed. Don't fill after 08/06/2007  #1 x 0   Entered and Authorized by:   Leone Haven MD   Signed by:   Leone Haven MD on 07/26/2007   Method used:   Print then Give to Patient   RxID:   [id number]  ] Laboratory Results  Date/Time Received: ...................................................................Carley Hammed  July 26, 2007 11:18 AM  Date/Time Reported: ...................................................................Carley Hammed  July 26, 2007 11:18 AM   Other Tests  Rapid Strep: negative

## 2010-10-08 NOTE — Assessment & Plan Note (Signed)
Summary: f/u..em    History of Present Illness Visit Type: follow up  Primary GI MD: [name] MD Primary Lister Brizzi: Garnet Koyanagi, DO Requesting Dilan Fullenwider: n/a Chief Complaint: F/u for colitis. Pt denies any GI complaints  History of Present Illness:   This is a 55 year old white female with ulcerative colitis for which she is status post total colectomy and ileoanal pullthrough in 1998. Her last office visit was in December 2009. Patient's last colonoscopies were in 2005 and in June 2009. 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 She has been unable to hold a job because of frequent stools and incontinence. She saw [place] in December 2009. Since patient's last office visit, she has had several sets of lab studies done. Her last labwork done on 03/14/08 showed her to have a red cell count of 3.45, hemoglobin of 10.9, hematocrit of 32, iron of 36, transferritin on 187.3, iron saturation of 13.7 and an albumin of 3.2 all of which were slightly low counts. Patient's ferritin was normal at 260.8. Subsequent labwork was completed for parathyroid and potassium levels. Patients parathyroid counts were high at 116.7 and her calcium was normal. Patient had an Infed iron infusion done on 03/17/09 at [facility] with good results. Patient comes today for a follow up. Her main complaint consists of the fact that she has multiple joint pains during the day and at night. The pain occurs mostly in her knees and hips. She takes hydrocodone p.r.n.   GI Review of Systems      Denies abdominal pain, acid reflux, belching, bloating, chest pain, dysphagia with liquids, dysphagia with solids, heartburn, loss of appetite, nausea, vomiting, vomiting blood, weight loss, and  weight gain.        Denies anal fissure, black tarry stools, change in bowel habit, constipation, diarrhea, diverticulosis, fecal incontinence, heme positive stool, hemorrhoids, irritable bowel syndrome,  jaundice, light color stool, liver problems, rectal bleeding, and  rectal pain.    Current Medications (verified): 1)  Cymbalta 60 Mg  Cpep (Duloxetine Hcl) .... 2 By Mouth Qd 2)  Vicodin 5-500 Mg  Tabs (Hydrocodone-Acetaminophen) .... Take 1 Tablet By Mouth Every 4-6 Hours As Needed For Pain 3)  Protonix 40 Mg  Tbec (Pantoprazole Sodium) .... Take 1 Tablet By Mouth Once A Day. Must Have Office Visit For Further Refills! 4)  Lopressor 50 Mg  Tabs (Metoprolol Tartrate) .Marland Kitchen.. 1 By Mouth Once Daily 5)  Canasa 1000 Mg Supp (Mesalamine) .... Insert 1 Suppository Into Rectum Every Night As Needed 6)  Multivitamins  Tabs (Multiple Vitamin) .... Take 1 Tablet By Mouth Once A Day 7)  Darvocet A500 100-500 Mg Tabs (Propoxyphene N-Apap) .Marland Kitchen.. 1 By Mouth Every 6 Hours As Needed  Allergies (verified): 1)  ! Sulfa 2)  ! Morphine  Past History:  Past Medical History: Reviewed history from 02/28/2009 and no changes required.   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 Pt is married. Patient has 2 children. Alcohol Use - yes-on occasion Illicit Drug Use -  no Occupation: Unemployed.  Previously worked for [employer] Patient states former smoker. Started at age 1.  1 ppd.  Quit at age 3  Review of Systems  The patient denies allergy/sinus, anemia, anxiety-new, arthritis/joint pain, back pain, blood in urine, breast changes/lumps, change in vision, confusion, cough, coughing up blood, depression-new, fainting, fatigue, fever, headaches-new, hearing problems, heart murmur, heart rhythm changes, itching, menstrual pain, muscle pains/cramps, night sweats, nosebleeds, pregnancy symptoms, shortness of breath, skin rash, sleeping problems, sore throat, swelling of feet/legs, swollen lymph glands, thirst - excessive , urination - excessive , urination changes/pain, urine leakage, vision changes, and voice change.         Pertinent positive and negative review of systems were noted in the above HPI. All other ROS was otherwise negative.   Vital Signs:  Patient profile:   55 year old female Height:      64.5 inches Weight:      155 pounds BMI:     26.29 BSA:     1.77 Pulse rate:   72 / minute Pulse rhythm:   regular BP sitting:   112 / 70  (left arm) Cuff size:   regular  Vitals Entered By: Hope Pigeon CMA (April 23, 2009 11:37 AM)  Physical Exam  General:  alert, oriented and in no distress. Eyes:  nonicteric. Mouth:  normal mucosa. Neck:  Supple; no masses or thyromegaly. Lungs:  Clear throughout to auscultation. Heart:  Regular rate and rhythm; no murmurs, rubs,  or bruits. Abdomen:  soft relaxed abdomen with stretch marks. Normoactive bowel sounds. No tenderness. Well-healed surgical scar. Liver edge at costal margin. Msk:  I do not appreciate any joint deformities or swelling. Extremities:  no edema. Psych:  Alert and cooperative. Normal mood and affect.   Impression & Recommendations:  Problem # 1:  ULCERATIVE COLITIS (ICD-556.9) Patient is status post total colectomy  and ileoproctostomy with good results. Patient's situation is  stable at this time. There is no evidence of pouchitis. Orders: Rheumatology Referral (Rheumatology) TLB-CBC Platelet - w/Differential (85025-CBCD) TLB-IBC Pnl (Iron/FE;Transferrin) (83550-IBC)  Problem # 2:  ANEMIA (ICD-285.9) Patient has chronic iron deficiency anemia likely from gastrointestinal blood loss issue requiring iron infusions periodically. We will check her CBC today.  Problem # 3:  HIP PAIN, LEFT (ICD-719.45) Patient has multiple joint pains which seem to be increasing in frequency and severity. The pains affect mostly her knees, back and hips. We need to rule out IBD arthropathy. Patient would like to have this further evaluated because the pains wake her up at night.  Problem # 4:  HYPERPARATHYROIDISM, HX OF (ICD-V12.2) Patient has chronic renal insufficiency which is followed by the renal service. Her creatinine is usually around 1.5 and her BUN is 15.  Patient Instructions: 1)  CBC today. 2)  Disability application pending. 3)  Rheumatology referral to rule out IBD arthropathy 4)  Copy sent to :Dr Bo Merino

## 2010-10-08 NOTE — Assessment & Plan Note (Signed)
Summary: FLU SHOT///SPH   Nurse Visit   Allergies: 1)  ! Sulfa 2)  ! Morphine  Orders Added: 1)  Admin 1st Vaccine [90471] 2)  Flu Vaccine 22yr + [[36468]Flu Vaccine Consent Questions     Do you have a history of severe allergic reactions to this vaccine? no    Any prior history of allergic reactions to egg and/or gelatin? no    Do you have a sensitivity to the preservative Thimersol? no    Do you have a past history of Guillan-Barre Syndrome? no    Do you currently have an acute febrile illness? no    Have you ever had a severe reaction to latex? no    Vaccine information given and explained to patient? yes    Are you currently pregnant? no    Lot Number:[id number]   Exp Date:03/05/2010   Site Given  Left Deltoid IM .lbflu

## 2010-10-08 NOTE — Letter (Signed)
Summary: [place]   Imported By: Bubba Hales 07/30/2009 08:46:40  _____________________________________________________________________  External Attachment:    Type:   Image     Comment:   External Document

## 2010-10-08 NOTE — Consult Note (Signed)
Summary: [place]   Imported By: Bubba Hales 06/23/2009 07:44:52  _____________________________________________________________________  External Attachment:    Type:   Image     Comment:   External Document

## 2010-10-08 NOTE — Letter (Signed)
Summary: External Correspondence-[facility]  External Correspondence-[facility]   Imported By: Vanessa Martinique 06/05/2007 11:46:41  _____________________________________________________________________  External Attachment:    Type:   Image     Comment:   External Document

## 2010-10-08 NOTE — Letter (Signed)
Summary: External Other  External Other   Imported By: Marisue Humble CMA 09/13/2008 09:06:46  _____________________________________________________________________  External Attachment:    Type:   Image     Comment:   External Document

## 2010-10-08 NOTE — Assessment & Plan Note (Signed)
Summary: FELT LIKE PASSING OUT RAPID HEARTRATE/CDJ   Vital Signs:  Patient Profile:   55 Years Old Female Height:     64.5 inches Weight:      178.38 pounds BMI:     30.25 Temp:     98.1 degrees F oral Pulse rate:   76 / minute BP sitting:   116 / 78  (right arm) BP standing:   96 / 70  Vitals Entered By: Jiles Garter (Feb 02, 2008 4:27 PM)                 Serial Vital Signs/Assessments:  Time      Position  BP       Pulse  Resp  Temp     By           Lying RA  118/78                         Jiles Garter           Sitting   116/78                         Jiles Garter           Standing  [place]   Chief Complaint:  C/O SUDDEN ONSET OF ALMOST PASSING OUT and Syncope.  History of Present Illness:  Presyncope      This is a 55 year old woman who presents with presyncope.  The patient denies loss of consciousness, premonitory symptoms, palpitations, chest pain, seizures, and incontinence.  The patient denies the following symptoms: headache, abdominal discomfort, nausea, vomiting, and feeling warm.  The patient reports the following precipitating factors: none noted.  Risk factors for presyncope include volume depletion.  Evaluation to date has included EKG.    Pt has history of ulcerative colitis status post  total colectomy, ileoproctostomy and loop ileostomy with reconnection.  She has been admitted in the past for dehydration.  She stopped Maxzide as directed by Dr. Cathie Olden several weeks ago.  She denies nausea/vomiting or diarrhea.    Current Allergies (reviewed today): ! SULFA ! 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   Head:     normocephalic and atraumatic.   Eyes:     vision grossly intact, pupils equal, pupils round, and pupils reactive to light.   Ears:     R ear normal and L ear normal.   Mouth:     pharynx pink and moist and no erythema.   Neck:     supple, no masses, and no carotid bruits.   Lungs:     normal respiratory effort and normal breath sounds.   Heart:     normal rate, regular rhythm, and no gallop.   Abdomen:     soft, non-tender, and no masses.   Extremities:     No lower extremity edema  Neurologic:     alert & oriented X3 and cranial nerves II-XII intact.   Skin:     turgor normal and color normal.   Psych:     normally interactive and good eye contact.      Impression & Recommendations:  Problem # 1:  SYNCOPE AND COLLAPSE (ICD-780.2) Pt with pre syncope of unclear etiology.  She does not appear volume depleted despite mild orthostasis.  Her recent electrolytes and Cr are stable.  EKG shows NSR at 82 bpm with non specific ST changes.  I advised follow up with Dr. Cathie Olden to rule out cardiogenic syncope.  Pt urged to avoid vigorous exercise until cardiac work up complete.  If cardiac work up is negative consider use of florinef.  Problem # 2:  RENAL INSUFFICIENCY (ICD-588.9) Cr is 1.9.  She is at  baseline. Nephrology notes previous negative SPEP/UPEP, renal ultrasound, and bland urine.   Problem # 3:  HYPERTENSION (ICD-401.9) I agree with discontinuing diuretic.  She was started on B Blocker by cardiology. The following medications were removed from the medication list:    Triamterene-hctz 37.5-25 Mg Tabs (Triamterene-hctz) .Marland Kitchen... 1 qd  BP today: 116/78 Prior BP: 108/71 (12/15/2007)  Labs Reviewed: Creat: 1.7 (10/16/2007) Chol: 191 (10/16/2007)   HDL: 38.1 (10/16/2007)   LDL: 124 (10/16/2007)   TG: 142 (10/16/2007)   Complete Medication List: 1)  Cymbalta 60 Mg Cpep (Duloxetine hcl) .... 2 by mouth qd 2)  Vicoden Prn  3)  Protonix 40 Mg Tbec (Pantoprazole sodium) .Marland Kitchen.. 1 by mouth qd 4)  Ciprofloxacin Hcl 500 Mg Tabs (Ciprofloxacin hcl) .... Take 1 tablet by mouth once a day 5)  Bp Med Starts With "n" - 50 Mg Per Day   Other Orders: EKG w/ Interpretation (93000)    ] Current Allergies (reviewed today): ! SULFA ! MORPHINE

## 2010-10-08 NOTE — Consult Note (Signed)
Summary: [facility]   Imported By: Jerrye Noble D'jimraou 02/16/2008 12:25:07  _____________________________________________________________________  External Attachment:    Type:   Image     Comment:   External Document

## 2010-10-08 NOTE — Assessment & Plan Note (Signed)
Summary: FOR LEG PAIN//PH   Vital Signs:  Patient profile:   55 year old female Weight:      150.38 pounds Pulse rate:   76 / minute Pulse rhythm:   regular BP sitting:   122 / 80  (left arm) Cuff size:   regular  Vitals Entered By: [place] (August 05, 2009 11:39 AM) CC: Pt c/o still having leg pain. Pt states she twitches during the night. Her muscles hurt.    History of Present Illness: Pt here c/o twitching in legs at night mostly and muscle aches.  Pt was sent to rheum by Dr Olevia Perches and she was dx with bursitis in hips.     Allergies: 1)  ! Sulfa 2)  ! Morphine  Past History:  Past Medical History: Last updated: 02/28/2009   SMALL BOWEL OBSTRUCTION, HX OF (ICD-V12.79) OBESITY, UNSPECIFIED (ICD-278.00) HYPERPARATHYROIDISM, HX OF (ICD-V12.2) HYPOKALEMIA, HX OF (ICD-V12.2) HYPERTENSION (ICD-401.9) GASTRIC POLYP, HX OF (ICD-V12.79) GERD (ICD-530.81) DEPRESSION (ICD-311) ANXIETY (ICD-300.00) DIARRHEA (ICD-787.91) ANEMIA (ICD-285.9) RENAL INSUFFICIENCY (ICD-588.9) ULCERATIVE COLITIS (ICD-556.9)  Past Surgical History: Last updated: 04/23/2009 Loop ileostomy closure  Anal dilation Proctocolectomy and insertion of ileoanal J-Pouch with loop ileostomy Cholecystectomy Tubal Ligation Total Abdominal Hysterectomy Hemorrhoidectomy 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 Patient has 2 children. Alcohol Use - yes-on occasion Illicit Drug Use - no Occupation: Unemployed.  Previously worked for [employer] Patient states former smoker. Started at age 23.  1 ppd.  Quit at age 30  Risk Factors: Smoking Status: quit (02/28/2009)  Family History: Reviewed history from 04/23/2009 and no  changes required. Family History of Heart Disease: Father F--UC D- UC Granddaughters--- IBS Family History of Bowel disease allergies: mother heart disease: father  No FH of Colon Cancer:  Social History: Reviewed history from 02/28/2009 and no changes required. Pt is married. Patient has 2 children. Alcohol Use - yes-on occasion Illicit Drug Use - no Occupation: Unemployed.  Previously worked for [employer] Patient states former smoker. Started at age 49.  1 ppd.  Quit at age 27  Review of Systems      See HPI  Physical Exam  General:  Well-developed,well-nourished,in no acute distress; alert,appropriate and cooperative throughout examination Msk:  normal ROM, no joint swelling, no joint warmth, no redness over joints, and no joint deformities.   Multiple tender points Extremities:  No clubbing, cyanosis, edema, or deformity noted with normal full range of motion of all joints.   Skin:  Intact without suspicious lesions or rashes Psych:  Cognition and judgment appear intact. Alert and cooperative with normal attention span and concentration. No apparent delusions, illusions, hallucinations   Impression & Recommendations:  Problem # 1:  PAIN IN JOINT, MULTIPLE SITES (ICD-719.49) ? fibromyalgia Lyrica 50 mg three times a day ---start with 1 at night and increase slowly tid  Problem # 2:  RESTLESS LEGS SYNDROME (ICD-333.94) consider requip or miralex --if lyrica has no effect pt understands--we will only add one med at a time  Complete Medication List: 1)  Cymbalta 60 Mg Cpep (Duloxetine hcl) .... 2 by mouth qd 2)  Vicodin 5-500 Mg Tabs (Hydrocodone-acetaminophen) .... Take 1 tablet by mouth every 4-6 hours as needed for pain 3)  Protonix 40 Mg Tbec (Pantoprazole sodium) .Marland KitchenMarland KitchenMarland Kitchen  Take 1 tablet by mouth once a day. 4)  Lopressor 50 Mg Tabs (Metoprolol tartrate) .Marland Kitchen.. 1 by mouth once daily 5)  Multivitamins Tabs (Multiple vitamin) .... Take 1 tablet by mouth once a day 6)  Cipro  250 Mg Tabs (Ciprofloxacin hcl) .... Take 1 tablet by mouth once a day 7)  Lyrica 50 Mg Caps (Pregabalin) .Marland Kitchen.. 1 by mouth three times a day  Prescriptions: VICODIN 5-500 MG  TABS (HYDROCODONE-ACETAMINOPHEN) Take 1 tablet by mouth every 4-6 hours as needed for pain  #30 x 0   Entered and Authorized by:   Garnet Koyanagi DO   Signed by:   Garnet Koyanagi DO on 08/05/2009   Method used:   Print then Give to Patient   RxID:   [id number]

## 2010-10-08 NOTE — Progress Notes (Signed)
Summary: Record request  Request for records received from [employer]. Request forwarded to [employer]. Dena Chavis  September 04, 2009 9:41 AM

## 2010-10-08 NOTE — Medication Information (Signed)
Summary: Visual merchandiser   Imported By: Malachi Bonds 06/05/2007 16:14:48  _____________________________________________________________________  External Attachment:    Type:   Image     Comment:   External Document

## 2010-10-08 NOTE — Procedures (Signed)
Summary: EGD (11/15/01)  Gastroenterology ENDO   Imported By: Awilda Bill CMA 02/05/2008 16:05:04  _____________________________________________________________________  External Attachment:    Type:   Image     Comment:   External Document

## 2010-10-08 NOTE — Letter (Signed)
Summary: Results Follow-up Letter  [facility] [place]   [place], [place] [address]   Phone: [phone]  Fax: [phone]    02/17/2009        [place], [place]  [address]  Dear Ms. Garbett,   The following are the results of your recent test(s):  Test     Result     Pap Smear    Normal_______  Not Normal_____       Comments: _________________________________________________________ Cholesterol LDL(Bad cholesterol):          Your goal is less than:         HDL (Good cholesterol):        Your goal is more than: _________________________________________________________ Other Tests:   _________________________________________________________  Please call for an appointment Or ___Please see attached labwork and call the office to schedule a repeat ESR.______________________________________________________ _________________________________________________________ _________________________________________________________  Sincerely,  Carley Hammed [place] at [facility]

## 2010-10-08 NOTE — Progress Notes (Signed)
Summary: ? re appt    Phone Note Call from Patient Call back at Work Phone [phone]   Caller: Patient Call For: Dr Olevia Perches Reason for Call: Talk to Nurse Summary of Call: Patient wants to know if she needs bloodwork done before her appt 1-16 Initial call taken by: Ronalee Red,  September 03, 2010 1:27 PM  Follow-up for Phone Call        Patient last labs at [facility] on 11/11. Does she need labs prior to 09/21/10 visit ? Please, advise Follow-up by: Leone Payor RN,  September 03, 2010 1:49 PM  Additional Follow-up for Phone Call Additional follow up Details #1::        no labs needed Additional Follow-up by: Lafayette Dragon MD,  September 03, 2010 11:01 PM

## 2010-10-08 NOTE — Miscellaneous (Signed)
Summary: Protonix Rx  Clinical Lists Changes  Medications: Changed medication from PROTONIX 40 MG  TBEC (PANTOPRAZOLE SODIUM) 1 by mouth qd to PROTONIX 40 MG  TBEC (PANTOPRAZOLE SODIUM) Take 1 tablet by mouth once a day. MUST HAVE OFFICE VISIT FOR FURTHER REFILLS! - Signed Rx of PROTONIX 40 MG  TBEC (PANTOPRAZOLE SODIUM) Take 1 tablet by mouth once a day. MUST HAVE OFFICE VISIT FOR FURTHER REFILLS!;  #90 x 0;  Signed;  Entered by: Awilda Bill CMA (AAMA);  Authorized by: Lafayette Dragon MD;  Method used: Electronically to [facility] [phone]*, [address] [place], [place], [place]  [address], Ph: [phone] or [phone], Fax: [phone]    Prescriptions: PROTONIX 40 MG  TBEC (PANTOPRAZOLE SODIUM) Take 1 tablet by mouth once a day. MUST HAVE OFFICE VISIT FOR FURTHER REFILLS!  #90 x 0   Entered by:   Awilda Bill CMA ([place])   Authorized by:   Lafayette Dragon MD   Signed by:   Awilda Bill CMA ([place]) on 03/24/2009   Method used:   Electronically to        [facility]. [phone]* (retail)       [address] [place], [place]  [address]       Ph: [phone] or [phone]       Fax: [phone]   RxID:   [id number]

## 2010-10-08 NOTE — Progress Notes (Signed)
Summary: ORTHOPAEDIC REFERRAL  Phone Note Outgoing Call   Call placed by: Phylliss Bob [facility],  Jan 28, 2010 3:32 PM Call placed to: Patient Summary of Call: IN REFERENCE TO 01-28-2010 ORTHOPAEDIC REFERRAL, PT STATES SHE DOES NOT NEED THIS REFERRAL, SHE IS ALREADY ESTABLISHED W/SM & OC & HAS FOLLOW UP APPT W/DR [place] ON 03-25-2010 @ 9:50AM FOR HIP, JOINT PAIN, ETC... Phylliss Bob [facility]  Jan 28, 2010 3:32 PM   Follow-up for Phone Call        that is fine but Dr Estanislado Pandy is a rheumatologist not an ortho Follow-up by: Garnet Koyanagi DO,  Jan 29, 2010 8:35 AM  Additional Follow-up for Phone Call Additional follow up Details #1::        I S/W PATIENT & INFORMED HER OF ABOVE, SHE WOULD LIKE TO TALK TO DR. Olevia Perches PRIOR TO MAKING DECISION IF SHE WANTS A SEPARATE ORTHO DR.  PATIENT WILL CALL ME BACK. Phylliss Bob [facility]  February 06, 2010 2:42 PM

## 2010-10-09 ENCOUNTER — Emergency Department (HOSPITAL_COMMUNITY)
Admission: EM | Admit: 2010-10-09 | Discharge: 2010-10-09 | Disposition: A | Discharge status: Home / Self Care | Payer: MEDICARE | Attending: Emergency Medicine | Admitting: Emergency Medicine

## 2010-10-09 DIAGNOSIS — E860 Dehydration: Secondary | ICD-10-CM | POA: Insufficient documentation

## 2010-10-09 DIAGNOSIS — R5383 Other fatigue: Secondary | ICD-10-CM | POA: Insufficient documentation

## 2010-10-09 DIAGNOSIS — F329 Major depressive disorder, single episode, unspecified: Secondary | ICD-10-CM | POA: Insufficient documentation

## 2010-10-09 DIAGNOSIS — I10 Essential (primary) hypertension: Secondary | ICD-10-CM | POA: Insufficient documentation

## 2010-10-09 DIAGNOSIS — R197 Diarrhea, unspecified: Secondary | ICD-10-CM | POA: Insufficient documentation

## 2010-10-09 DIAGNOSIS — F3289 Other specified depressive episodes: Secondary | ICD-10-CM | POA: Insufficient documentation

## 2010-10-09 DIAGNOSIS — Z79899 Other long term (current) drug therapy: Secondary | ICD-10-CM | POA: Insufficient documentation

## 2010-10-09 DIAGNOSIS — R5381 Other malaise: Secondary | ICD-10-CM | POA: Insufficient documentation

## 2010-10-09 LAB — DIFFERENTIAL
Basophils Absolute: 0 10*3/uL (ref 0.0–0.1)
Basophils Relative: 1 % (ref 0–1)
Eosinophils Absolute: 0.5 10*3/uL (ref 0.0–0.7)
Eosinophils Relative: 6 % — ABNORMAL HIGH (ref 0–5)
Lymphocytes Relative: 33 % (ref 12–46)
Lymphs Abs: 2.8 10*3/uL (ref 0.7–4.0)
Monocytes Absolute: 0.6 10*3/uL (ref 0.1–1.0)
Monocytes Relative: 7 % (ref 3–12)
Neutro Abs: 4.6 10*3/uL (ref 1.7–7.7)
Neutrophils Relative %: 54 % (ref 43–77)

## 2010-10-09 LAB — BASIC METABOLIC PANEL
BUN: 18 mg/dL (ref 6–23)
CO2: 22 mEq/L (ref 19–32)
Calcium: 9.2 mg/dL (ref 8.4–10.5)
Chloride: 105 mEq/L (ref 96–112)
Creatinine, Ser: 1.64 mg/dL — ABNORMAL HIGH (ref 0.4–1.2)
GFR calc Af Amer: 39 mL/min — ABNORMAL LOW (ref >60)
GFR calc non Af Amer: 32 mL/min — ABNORMAL LOW (ref >60)
Glucose, Bld: 100 mg/dL — ABNORMAL HIGH (ref 70–99)
Potassium: 4.3 mEq/L (ref 3.5–5.1)
Sodium: 134 mEq/L — ABNORMAL LOW (ref 135–145)

## 2010-10-09 LAB — CBC
HCT: 36.4 % (ref 36.0–46.0)
Hemoglobin: 12.2 g/dL (ref 12.0–15.0)
MCH: 31.3 pg (ref 26.0–34.0)
MCHC: 33.5 g/dL (ref 30.0–36.0)
MCV: 93.3 fL (ref 78.0–100.0)
Platelets: 329 10*3/uL (ref 150–400)
RBC: 3.9 MIL/uL (ref 3.87–5.11)
RDW: 13.7 % (ref 11.5–15.5)
WBC: 8.5 10*3/uL (ref 4.0–10.5)

## 2010-10-09 NOTE — Letter (Signed)
Summary: [facility]   Imported By: Edmonia James 06/12/2010 12:03:15  _____________________________________________________________________  External Attachment:    Type:   Image     Comment:   External Document

## 2010-10-09 NOTE — Consult Note (Signed)
Summary: office note/[facility]  office note/[facility]   Imported By: Bubba Hales 02/15/2008 11:31:59  _____________________________________________________________________  External Attachment:    Type:   Image     Comment:   External Document

## 2010-10-14 NOTE — Progress Notes (Signed)
Summary: Triage   Phone Note Call from Patient Call back at Work Phone [phone]   Caller: Patient Call For: Dr. Olevia Perches Reason for Call: Talk to Nurse Summary of Call: patient feels so weak with no engery at all, thinks it may be her iron or dehydration Initial call taken by: Martin[place] Johnson,  October 07, 2010 3:41 PM  Follow-up for Phone Call        Patient calling because she is thinking she is either dehyrated or her iron is low. She states she is drinking Pedialyte and is eating okay but she is feeling tired. States she "passed out" last Friday when she got up to go to the bathroom but this is not unusual for her. Last lab check at [facility] on 07/16/10- hgb 11.6. Patient wants to know if she should have her labs checked. Please, advise. Follow-up by: Leone Payor RN,  October 07, 2010 4:59 PM  Additional Follow-up for Phone Call Additional follow up Details #1::        yes, please check TSH, CBC, Iron ,IBC, B12, renal profile. Additional Follow-up by: [place] Dragon MD,  October 08, 2010 10:47 AM    Additional Follow-up for Phone Call Additional follow up Details #2::    Message left for patient to call back.Leone Payor RN  October 08, 2010 11:14 AM  Returning your call. Would rather you called back at [phone] this morning. Irwin Brakeman [facility]  October 09, 2010 9:18 AM Spoke with patient. She will come in for labs.  Follow-up by: Leone Payor RN,  October 09, 2010 9:25 AM

## 2010-10-14 NOTE — Progress Notes (Signed)
Summary: more canasa  Medications Added CANASA 1000 MG SUPP (MESALAMINE) Insert 1 suppository into rectum and retain at bedtime       Phone Note Call from Patient Call back at Home Phone [phone]   Caller: Patient Call For:  Dr Olevia Perches Reason for Call: Talk to Nurse Summary of Call: Patient wants to know if she can get more Canasa Sup. states that it would be cheaper for her. Initial call taken by: Ronalee Red,  October 05, 2010 3:29 PM  Follow-up for Phone Call        Dr Olevia Perches, do you want me to provide patient with more Canasa Suppositories? Last prescription was sent in June 2011. Follow-up by: Madlyn Frankel CMA Deborra Medina),  October 05, 2010 4:20 PM  Additional Follow-up for Phone Call Additional follow up Details #1::        yes, she has pouchitis and needs to have her CANASA supp indefinitely, 1 at bedtime,x 1 year refills.  Additional Follow-up by: [place] Dragon MD,  October 06, 2010 1:06 PM    Additional Follow-up for Phone Call Additional follow up Details #2::    prescription sent. Patient verbalizes understanding. Dottie Nelson-Smith CMA (AAMA)  October 06, 2010 2:17 PM   New/Updated Medications: CANASA 1000 MG SUPP (MESALAMINE) Insert 1 suppository into rectum and retain at bedtime Prescriptions: CANASA 1000 MG SUPP (MESALAMINE) Insert 1 suppository into rectum and retain at bedtime  #90 x 3   Entered by:   Madlyn Frankel CMA (AAMA)   Authorized by:   Lafayette Dragon MD   Signed by:   [place] (AAMA) on 10/06/2010   Method used:   Electronically to        [facility] [phone]* (retail)       [address] [place], [place]  [address]       Ph: [phone] or [phone]       Fax: [phone]   RxID:   [id number]

## 2010-10-15 ENCOUNTER — Telehealth (INDEPENDENT_AMBULATORY_CARE_PROVIDER_SITE_OTHER): Payer: Self-pay | Admitting: *Deleted

## 2010-10-22 NOTE — Progress Notes (Signed)
  Medications Added LOMOTIL 2.5-0.025 MG TABS (DIPHENOXYLATE-ATROPINE) Take 1 tablet by mouth up to three times a day       Phone Note Outgoing Call   Call placed by: Leone Payor, RN Call placed to: Patient Summary of Call: Called patient to see why she has not gotten her labs drawn since lst Friday. Patient states she went to the ER on Friday for IVF and they drew some labs. ER report tranferred eo EMR(10/09/10) Does she need to do anyother labs? Initial call taken by: Leone Payor RN,  October 15, 2010 3:38 PM  Follow-up for Phone Call        I have reviewed her labs from ER which are all normal. Flagyl 250 three times a day , she is on it?, low residue diet, Lomotil # 60 1 by mouth up to three times a day for diarrhe, #60, 1 refill. No labs at this time. Follow-up by: [place] Dragon MD,  October 15, 2010 10:07 PM  Additional Follow-up for Phone Call Additional follow up Details #1::        Spoke with patient. She is currently taking Flagyl 250 mg three times a day. Patient given Dr. Nichola Sizer recommendations. Rx faxed to pharmacy. Additional Follow-up by: Leone Payor RN,  October 16, 2010 10:09 AM    New/Updated Medications: LOMOTIL 2.5-0.025 MG TABS (DIPHENOXYLATE-ATROPINE) Take 1 tablet by mouth up to three times a day Prescriptions: LOMOTIL 2.5-0.025 MG TABS (DIPHENOXYLATE-ATROPINE) Take 1 tablet by mouth up to three times a day  #60 x 1   Entered by:   Leone Payor RN   Authorized by:   Lafayette Dragon MD   Signed by:   Leone Payor RN on 10/16/2010   Method used:   Printed then faxed to ...       [facility] [phone]* (retail)       [address] [place], [place]  [address]       Ph: [phone] or [phone]       Fax: [phone]   RxID:   [id number]

## 2010-10-28 ENCOUNTER — Encounter (HOSPITAL_COMMUNITY): Payer: MEDICARE | Admitting: Physician Assistant

## 2010-10-28 DIAGNOSIS — F332 Major depressive disorder, recurrent severe without psychotic features: Secondary | ICD-10-CM

## 2010-11-04 ENCOUNTER — Emergency Department (HOSPITAL_COMMUNITY): Payer: MEDICARE

## 2010-11-04 ENCOUNTER — Emergency Department (HOSPITAL_COMMUNITY)
Admission: EM | Admit: 2010-11-04 | Discharge: 2010-11-04 | Disposition: A | Discharge status: Home / Self Care | Payer: MEDICARE | Attending: Emergency Medicine | Admitting: Emergency Medicine

## 2010-11-04 DIAGNOSIS — R42 Dizziness and giddiness: Secondary | ICD-10-CM | POA: Insufficient documentation

## 2010-11-04 DIAGNOSIS — Z79899 Other long term (current) drug therapy: Secondary | ICD-10-CM | POA: Insufficient documentation

## 2010-11-04 DIAGNOSIS — R000 Tachycardia, unspecified: Secondary | ICD-10-CM | POA: Insufficient documentation

## 2010-11-04 DIAGNOSIS — I10 Essential (primary) hypertension: Secondary | ICD-10-CM | POA: Insufficient documentation

## 2010-11-04 DIAGNOSIS — E860 Dehydration: Secondary | ICD-10-CM | POA: Insufficient documentation

## 2010-11-04 DIAGNOSIS — F3289 Other specified depressive episodes: Secondary | ICD-10-CM | POA: Insufficient documentation

## 2010-11-04 DIAGNOSIS — K649 Unspecified hemorrhoids: Secondary | ICD-10-CM | POA: Insufficient documentation

## 2010-11-04 DIAGNOSIS — R630 Anorexia: Secondary | ICD-10-CM | POA: Insufficient documentation

## 2010-11-04 DIAGNOSIS — F411 Generalized anxiety disorder: Secondary | ICD-10-CM | POA: Insufficient documentation

## 2010-11-04 DIAGNOSIS — F329 Major depressive disorder, single episode, unspecified: Secondary | ICD-10-CM | POA: Insufficient documentation

## 2010-11-04 DIAGNOSIS — R197 Diarrhea, unspecified: Secondary | ICD-10-CM | POA: Insufficient documentation

## 2010-11-04 LAB — CBC
HCT: 39.6 % (ref 36.0–46.0)
Hemoglobin: 13.5 g/dL (ref 12.0–15.0)
MCH: 32.1 pg (ref 26.0–34.0)
MCHC: 34.1 g/dL (ref 30.0–36.0)
MCV: 94.1 fL (ref 78.0–100.0)
Platelets: 350 10*3/uL (ref 150–400)
RBC: 4.21 MIL/uL (ref 3.87–5.11)
RDW: 13.7 % (ref 11.5–15.5)
WBC: 12.5 10*3/uL — ABNORMAL HIGH (ref 4.0–10.5)

## 2010-11-04 LAB — URINALYSIS, ROUTINE W REFLEX MICROSCOPIC
Hgb urine dipstick: NEGATIVE
Ketones, ur: 15 mg/dL — AB
Nitrite: POSITIVE — AB
Protein, ur: 100 mg/dL — AB
Specific Gravity, Urine: 1.034 — ABNORMAL HIGH (ref 1.005–1.030)
Urine Glucose, Fasting: NEGATIVE mg/dL
Urobilinogen, UA: 1 mg/dL (ref 0.0–1.0)
pH: 5.5 (ref 5.0–8.0)

## 2010-11-04 LAB — URINE MICROSCOPIC-ADD ON

## 2010-11-04 LAB — BASIC METABOLIC PANEL
BUN: 17 mg/dL (ref 6–23)
CO2: 21 mEq/L (ref 19–32)
Calcium: 9.3 mg/dL (ref 8.4–10.5)
Chloride: 107 mEq/L (ref 96–112)
Creatinine, Ser: 1.69 mg/dL — ABNORMAL HIGH (ref 0.4–1.2)
GFR calc Af Amer: 38 mL/min — ABNORMAL LOW (ref >60)
GFR calc non Af Amer: 31 mL/min — ABNORMAL LOW (ref >60)
Glucose, Bld: 122 mg/dL — ABNORMAL HIGH (ref 70–99)
Potassium: 4.8 mEq/L (ref 3.5–5.1)
Sodium: 138 mEq/L (ref 135–145)

## 2010-11-04 LAB — DIFFERENTIAL
Basophils Absolute: 0 10*3/uL (ref 0.0–0.1)
Basophils Relative: 0 % (ref 0–1)
Eosinophils Absolute: 0.2 10*3/uL (ref 0.0–0.7)
Eosinophils Relative: 2 % (ref 0–5)
Lymphocytes Relative: 6 % — ABNORMAL LOW (ref 12–46)
Lymphs Abs: 0.8 10*3/uL (ref 0.7–4.0)
Monocytes Absolute: 0.5 10*3/uL (ref 0.1–1.0)
Monocytes Relative: 4 % (ref 3–12)
Neutro Abs: 11 10*3/uL — ABNORMAL HIGH (ref 1.7–7.7)
Neutrophils Relative %: 88 % — ABNORMAL HIGH (ref 43–77)

## 2010-11-04 IMAGING — CR DG ABDOMEN ACUTE W/ 1V CHEST
3 series · 3 of 3 positions shown · non-contrast
Comparison: [DATE]

CLINICAL DATA: Abdominal pain and diarrhea.

ACUTE ABDOMEN SERIES (ABDOMEN 2 VIEW & CHEST 1 VIEW)

[w chest pa]
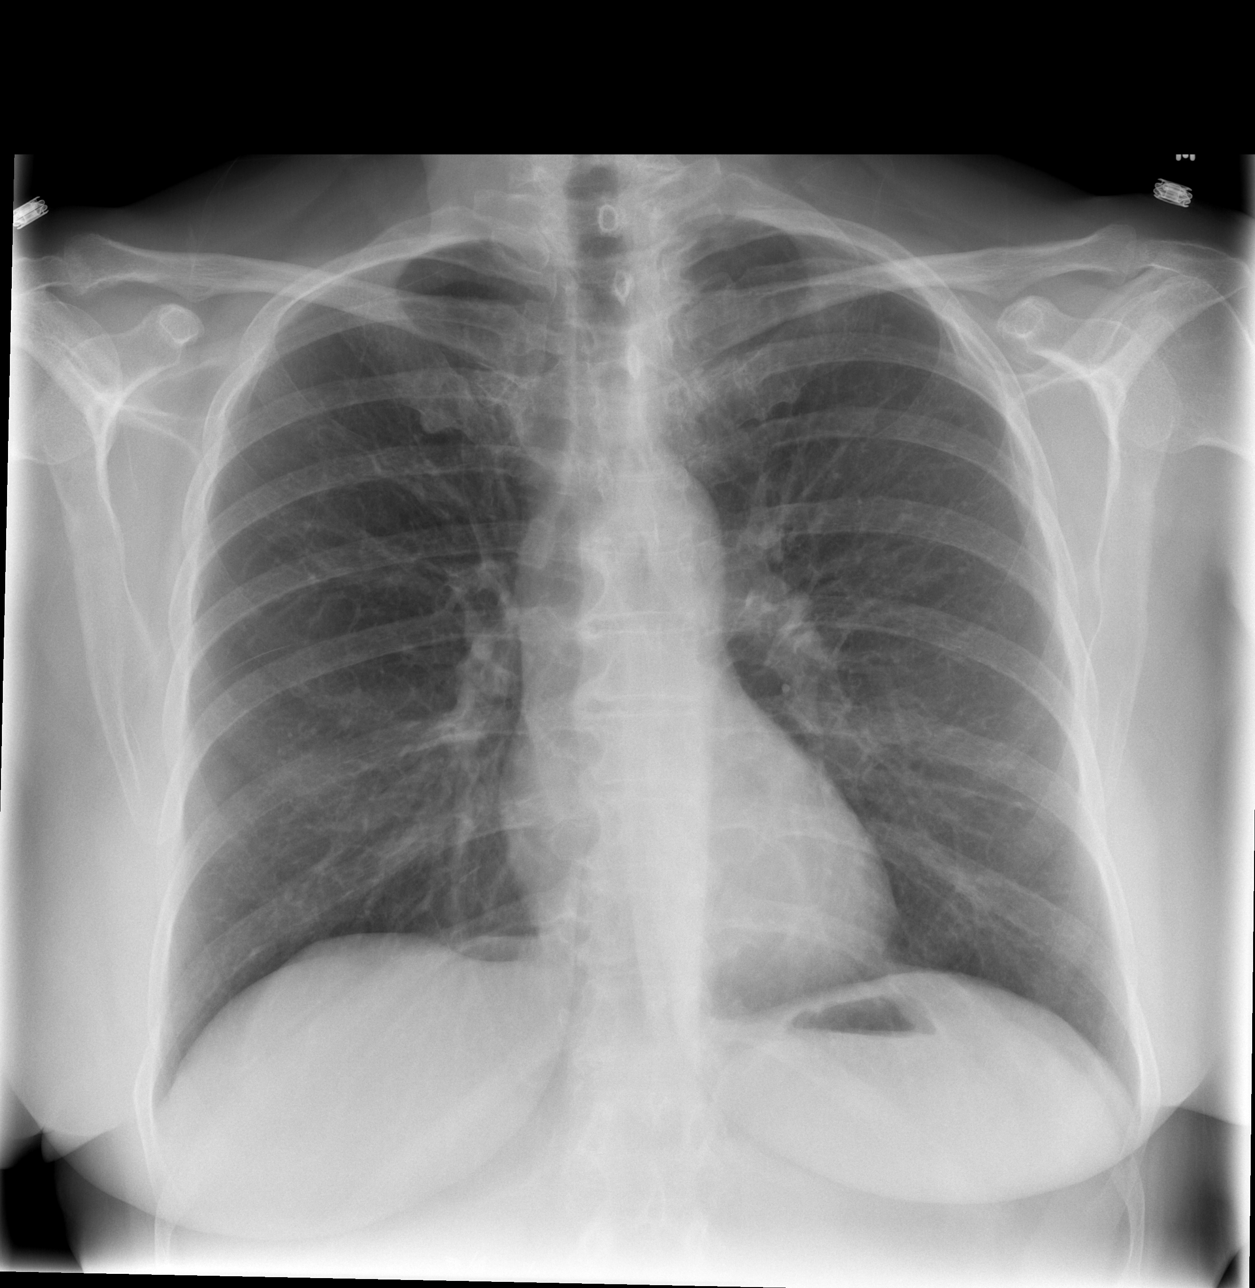

[w abdomen upright]
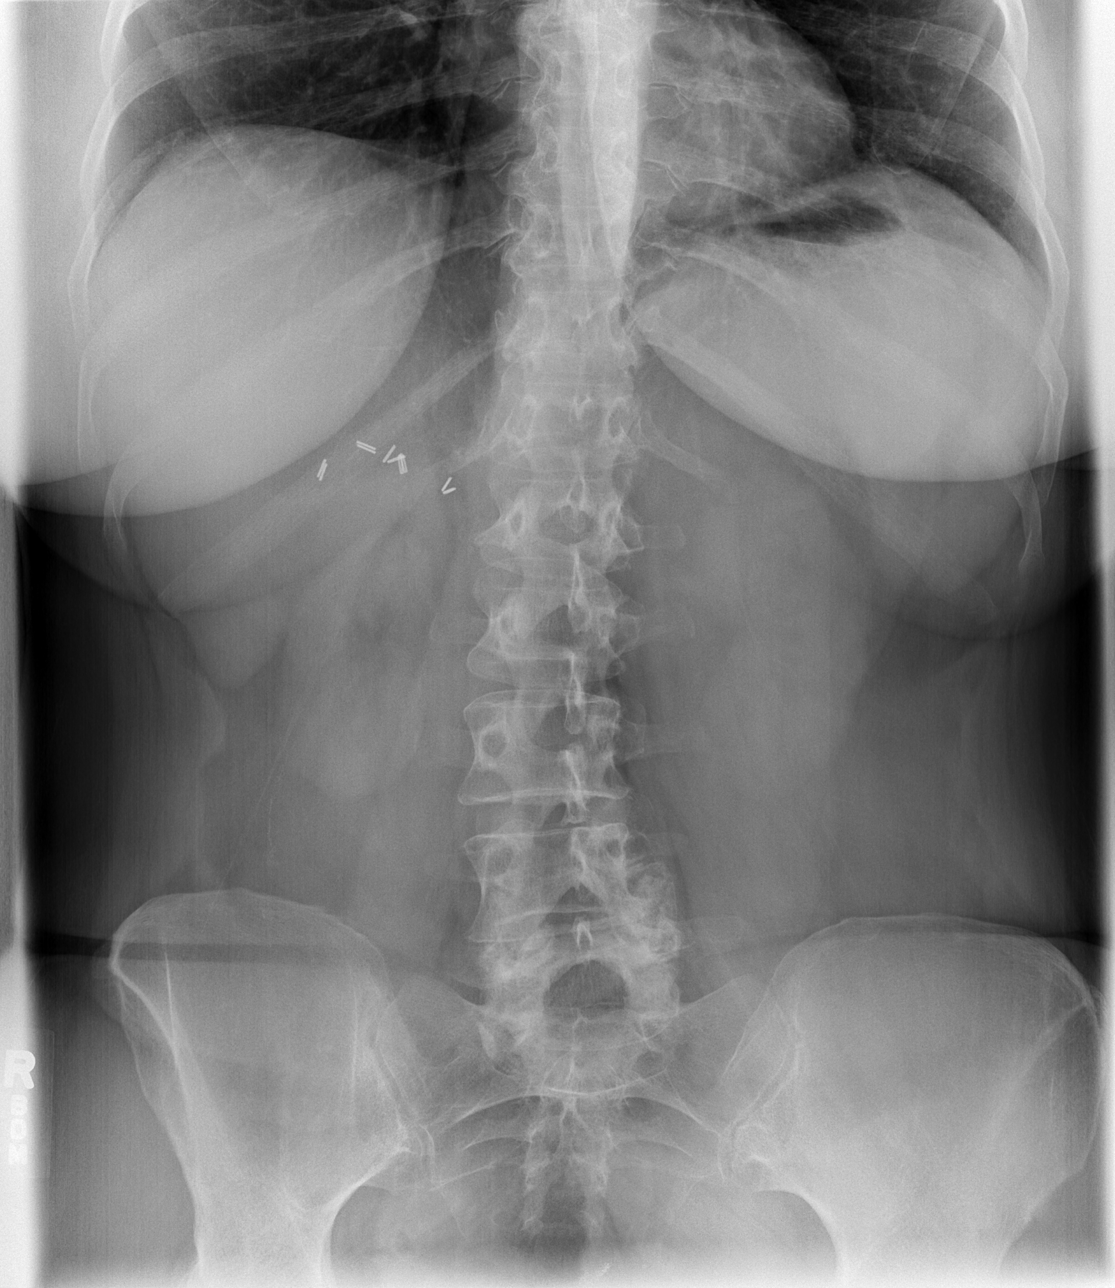

[t abdomen supine]
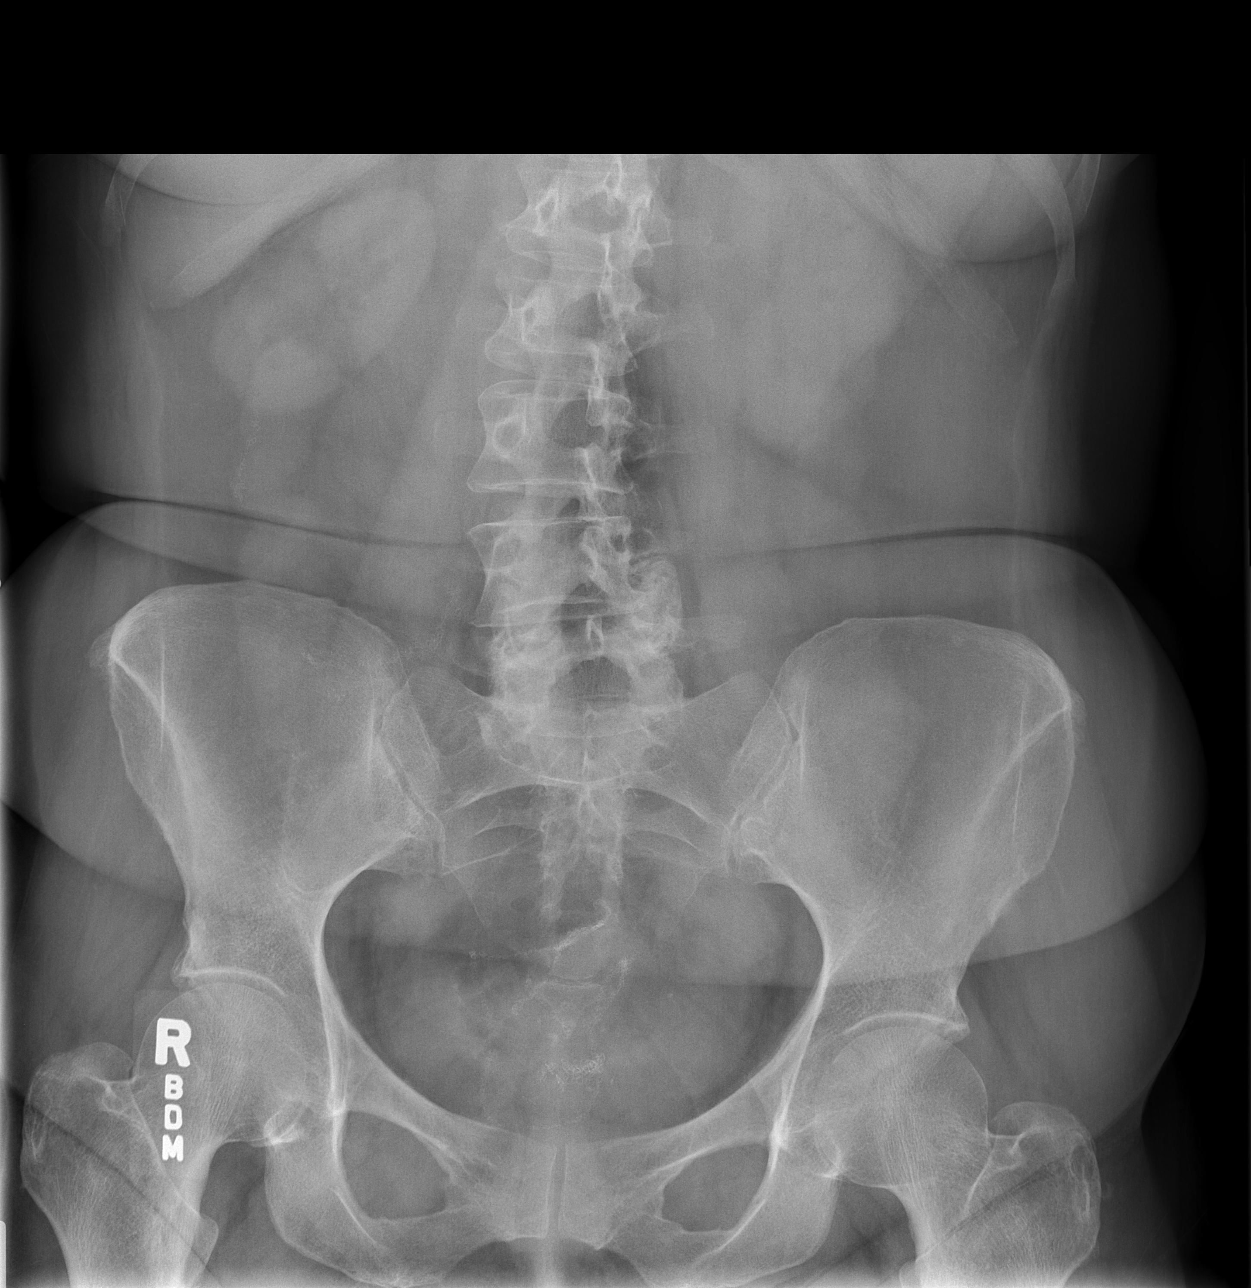

[3 of 3 positions shown; findings below may reference images not displayed]

FINDINGS: The lungs are clear.  No infiltrate or effusion or edema.

Clips are present in the gallbladder fossa.  Surgical clips are
present in the region of the rectum.  Negative for bowel
obstruction or free intraperitoneal gas.  Mild dextroscoliosis.  No
acute bony abnormality.  No renal calculi.
IMPRESSION: No acute abnormality.

## 2010-11-06 ENCOUNTER — Telehealth: Payer: Self-pay | Admitting: Internal Medicine

## 2010-11-17 NOTE — Progress Notes (Signed)
Summary: speak to nurse   Phone Note Call from Patient Call back at Work Phone [phone]   Caller: Patient Call For: Dr Olevia Perches Summary of Call: Patient wants to speak to nurse because she was in the hosp for dehydration. Initial call taken by: Ronalee Red,  November 06, 2010 9:28 AM  Follow-up for Phone Call        Message left for patient to call back.Leone Payor, RN 11/06/10 10:30 AM. Hulen Skains patient. She states she went to the ER for IVF last week and she wanted to let us know. States she thinks she had a virus. She had diarrhea for 2 weeks even when taking Lomotil. She is taking Flagyl and Cipro for a pouch infection. She states the diarrhea has stopped and she is feeling much better. Follow-up by: Leone Payor RN,  November 09, 2010 12:34 PM  Additional Follow-up for Phone Call Additional follow up Details #1::        reviewed. No new recommendations Additional Follow-up by: [place] Dragon MD,  November 09, 2010 1:27 PM

## 2010-11-23 ENCOUNTER — Telehealth: Payer: Self-pay | Admitting: Internal Medicine

## 2010-11-24 ENCOUNTER — Other Ambulatory Visit: Payer: Self-pay | Admitting: Internal Medicine

## 2010-11-24 NOTE — Telephone Encounter (Signed)
Called [facility] on [address]. I sent rx for vicodin on 11-23-10 for #30 with 1 refill. Pharmacy states that they have not received rx. I have given verbal order to pharmacist for Vicodin 5/500 mg 1 tablet po every 4-6 hours as needed for pain #30 with 1 refill. Patient may have filled on 11-26-10 (1 day early). I have left a message with female for patient to call back if she has questions.

## 2010-11-27 LAB — COMPREHENSIVE METABOLIC PANEL
ALT: 17 U/L (ref 0–35)
AST: 20 U/L (ref 0–37)
Albumin: 3.9 g/dL (ref 3.5–5.2)
Alkaline Phosphatase: 79 U/L (ref 39–117)
BUN: 25 mg/dL — ABNORMAL HIGH (ref 6–23)
CO2: 21 mEq/L (ref 19–32)
Calcium: 9.8 mg/dL (ref 8.4–10.5)
Chloride: 104 mEq/L (ref 96–112)
Creatinine, Ser: 1.99 mg/dL — ABNORMAL HIGH (ref 0.4–1.2)
GFR calc Af Amer: 31 mL/min — ABNORMAL LOW (ref >60)
GFR calc non Af Amer: 26 mL/min — ABNORMAL LOW (ref >60)
Glucose, Bld: 98 mg/dL (ref 70–99)
Potassium: 3.8 mEq/L (ref 3.5–5.1)
Sodium: 134 mEq/L — ABNORMAL LOW (ref 135–145)
Total Bilirubin: 0.6 mg/dL (ref 0.3–1.2)
Total Protein: 8.1 g/dL (ref 6.0–8.3)

## 2010-11-27 LAB — DIFFERENTIAL
Basophils Absolute: 0 10*3/uL (ref 0.0–0.1)
Basophils Relative: 0 % (ref 0–1)
Eosinophils Absolute: 0.2 10*3/uL (ref 0.0–0.7)
Eosinophils Relative: 2 % (ref 0–5)
Lymphocytes Relative: 23 % (ref 12–46)
Lymphs Abs: 2 10*3/uL (ref 0.7–4.0)
Monocytes Absolute: 0.7 10*3/uL (ref 0.1–1.0)
Monocytes Relative: 8 % (ref 3–12)
Neutro Abs: 5.9 10*3/uL (ref 1.7–7.7)
Neutrophils Relative %: 66 % (ref 43–77)

## 2010-11-27 LAB — URINALYSIS, ROUTINE W REFLEX MICROSCOPIC
Bilirubin Urine: NEGATIVE
Glucose, UA: NEGATIVE mg/dL
Hgb urine dipstick: NEGATIVE
Ketones, ur: NEGATIVE mg/dL
Nitrite: NEGATIVE
Protein, ur: NEGATIVE mg/dL
Specific Gravity, Urine: 1.011 (ref 1.005–1.030)
Urobilinogen, UA: 0.2 mg/dL (ref 0.0–1.0)
pH: 6 (ref 5.0–8.0)

## 2010-11-27 LAB — CBC
HCT: 41.8 % (ref 36.0–46.0)
Hemoglobin: 14.4 g/dL (ref 12.0–15.0)
MCHC: 34.5 g/dL (ref 30.0–36.0)
MCV: 93 fL (ref 78.0–100.0)
Platelets: 373 10*3/uL (ref 150–400)
RBC: 4.5 MIL/uL (ref 3.87–5.11)
RDW: 13.4 % (ref 11.5–15.5)
WBC: 8.8 10*3/uL (ref 4.0–10.5)

## 2010-11-27 LAB — LIPASE, BLOOD: Lipase: 40 U/L (ref 11–59)

## 2010-12-03 NOTE — Progress Notes (Signed)
Summary: Med refill  Medications Added VICODIN 5-500 MG  TABS (HYDROCODONE-ACETAMINOPHEN) Take 1 tablet by mouth every 4-6 hours as needed for pain NOT TO BE FILLED UNTIL 11/27/10       Phone Note Call from Patient Call back at Work Phone [phone]   Caller: Patient Call For: Dr. Olevia Perches Reason for Call: Refill Medication Summary of Call: Needs a refill on Vicodin.....[facility] [address] [phone] Initial call taken by: Webb Laws,  November 23, 2010 10:39 AM    New/Updated Medications: VICODIN 5-500 MG  TABS (HYDROCODONE-ACETAMINOPHEN) Take 1 tablet by mouth every 4-6 hours as needed for pain NOT TO BE FILLED UNTIL 11/27/10 Prescriptions: VICODIN 5-500 MG  TABS (HYDROCODONE-ACETAMINOPHEN) Take 1 tablet by mouth every 4-6 hours as needed for pain NOT TO BE FILLED UNTIL 11/27/10  #30 x 1   Entered by:   Madlyn Frankel CMA ([place])   Authorized by:   Lafayette Dragon MD   Signed by:   [place] ([place]) on 11/23/2010   Method used:   Printed then faxed to ...       [facility] [phone]* (retail)       [address] [place], [place]  [address]       Ph: [phone] or [phone]       Fax: [phone]   RxID:   [id number]  [facility] [address] Dottie Nelson-Smith CMA Deborra Medina)  November 23, 2010 12:29 PM

## 2010-12-17 LAB — COMPREHENSIVE METABOLIC PANEL
ALT: 22 U/L (ref 0–35)
AST: 21 U/L (ref 0–37)
Albumin: 3.6 g/dL (ref 3.5–5.2)
Alkaline Phosphatase: 93 U/L (ref 39–117)
BUN: 30 mg/dL — ABNORMAL HIGH (ref 6–23)
CO2: 22 mEq/L (ref 19–32)
Calcium: 9.8 mg/dL (ref 8.4–10.5)
Chloride: 110 mEq/L (ref 96–112)
Creatinine, Ser: 1.76 mg/dL — ABNORMAL HIGH (ref 0.4–1.2)
GFR calc Af Amer: 36 mL/min — ABNORMAL LOW (ref >60)
GFR calc non Af Amer: 30 mL/min — ABNORMAL LOW (ref >60)
Glucose, Bld: 118 mg/dL — ABNORMAL HIGH (ref 70–99)
Potassium: 5.3 mEq/L — ABNORMAL HIGH (ref 3.5–5.1)
Sodium: 139 mEq/L (ref 135–145)
Total Bilirubin: 0.6 mg/dL (ref 0.3–1.2)
Total Protein: 7.4 g/dL (ref 6.0–8.3)

## 2010-12-17 LAB — DIFFERENTIAL
Basophils Absolute: 0.1 10*3/uL (ref 0.0–0.1)
Basophils Relative: 1 % (ref 0–1)
Eosinophils Absolute: 0.3 10*3/uL (ref 0.0–0.7)
Eosinophils Relative: 3 % (ref 0–5)
Lymphocytes Relative: 35 % (ref 12–46)
Lymphs Abs: 3.3 10*3/uL (ref 0.7–4.0)
Monocytes Absolute: 0.7 10*3/uL (ref 0.1–1.0)
Monocytes Relative: 7 % (ref 3–12)
Neutro Abs: 5.2 10*3/uL (ref 1.7–7.7)
Neutrophils Relative %: 54 % (ref 43–77)

## 2010-12-17 LAB — CBC
HCT: 37.3 % (ref 36.0–46.0)
Hemoglobin: 12.7 g/dL (ref 12.0–15.0)
MCHC: 34.1 g/dL (ref 30.0–36.0)
MCV: 94.6 fL (ref 78.0–100.0)
Platelets: 408 10*3/uL — ABNORMAL HIGH (ref 150–400)
RBC: 3.94 MIL/uL (ref 3.87–5.11)
RDW: 13.5 % (ref 11.5–15.5)
WBC: 9.6 10*3/uL (ref 4.0–10.5)

## 2010-12-23 ENCOUNTER — Telehealth: Payer: Self-pay | Admitting: Internal Medicine

## 2010-12-23 NOTE — Telephone Encounter (Signed)
Patient picked up rx at [place] [facility] on 11/26/10 and has additional refill. I have asked that they go ahead and fill vicodin rx today.

## 2011-01-19 ENCOUNTER — Other Ambulatory Visit: Payer: Self-pay | Admitting: *Deleted

## 2011-01-19 MED ORDER — HYDROCODONE-ACETAMINOPHEN 5-500 MG PO TABS: ORAL_TABLET | ORAL | Status: DC

## 2011-01-19 NOTE — Assessment & Plan Note (Signed)
[facility]                         GASTROENTEROLOGY OFFICE NOTE   Jeanette, [name] [facility]                        MRN:          [id number]  DATE:04/17/2007                            DOB:          04-Dec-1955    [name] is a 55 year old white female with ulcerative colitis status  post total colectomy and ileoproctostomy in 1998 in [place] by Dr.  Zella Richer. She also had a loop ileostomy temporarily with reconnection  in August 1998. She had a cholecystectomy in 1988. Last flexible  sigmoidoscopy was November 2005. She showed intermittent pouchitis. The  patient has been followed for renal insufficiency [facility]. She  has chronic iron deficiency anemia requiring iron infusions. She is here  today because of a refill for her antibiotics as well as rectal  irritation which is usually improved by taking Canasa suppositories. She  is also on Calmoseptine rectal ointment.   MEDICATIONS:  1. Cymbalta 30 mg t.i.d.  2. Protonix 40 mg daily.  3. Triamterene hydrochlorothiazide 37.5/25 p.o. daily.  4. Hectorol 0.5 mcg b.i.d.  5. Metoprolol 25 mg p.o. daily.  6. Align one p.o. daily.   PHYSICAL EXAMINATION:  Blood pressure 126/80, pulse 64, weight 215  pounds. The patient has gained about 15 pounds since I last saw her in  September 2007. She was alert, oriented and in no distress.  LUNGS: Clear to auscultation.  COR: Normal S1, normal S2.  ABDOMEN: Obese, soft, nontender with normoactive bowel sounds, with well-  healed surgical scar in lower abdomen.  RECTAL: Not done.  EXTREMITIES: No edema.   IMPRESSION:  A 55 year old white female with ulcerative colitis, status  post total colectomy. Currently, her pouchitis is under good control.   PLAN:  1. Refills for Canasa suppository 1000 mg q nightly.  2. Cipro 250 p.o. b.i.d. to take every month for one week for      bacterial overgrowth.  3. Samples of Align one p.o. daily for bacterial  overgrowth.  4. Continue Calmoseptine ointment on p.r.n. basis. I will see her      again in six months.     Lowella Bandy. Olevia Perches, MD  Electronically Signed    DMB/MedQ  DD: 04/17/2007  DT: 04/18/2007  Job #: [id number]   cc:   Leone Haven, M.D.

## 2011-01-19 NOTE — Assessment & Plan Note (Signed)
[facility]                         GASTROENTEROLOGY OFFICE NOTE   MURLINE, WEIGEL [facility]                        MRN:          [id number]  DATE:07/05/2007                            DOB:          May 27, 1956    [name] is a 55 year old white female with ulcerative colitis, status  post total colectomy, ileoproctostomy after a loop ileostomy in May,  1998, with reconnection in August, 1998.  She is also status post  cholecystectomy.  Her last appointment was in August, 2008.  She did  well on Cipro for proctitis.  She stopped working at the end of August.  After that, her medical condition markedly improved because of the  relief of stress, until two weeks ago when she has had some stress with  her daughter as well as her son.  She developed exacerbation of  abdominal pain, diarrhea, and what she describes as proctitis.  She has  a rectal stricture which has been evaluated previously.  She denies any  rectal bleeding but has had increased frequency in her stools.   MEDICATIONS:  1. Protonix 40 mg p.o. daily.  2. Maxzide 25 mg p.o. daily.  3. Triamterene/HCTZ 37.5/25 mg p.o. daily.  4. Hectorol 0.5 mcg b.i.d.  5. Metoprolol 25 mg p.o. daily.  6. Align 1 p.o. daily.  7. Cymbalta 30 mg.  She takes 4 pills a day.  This was recently      increased by her psychologist.   PHYSICAL EXAMINATION:  Blood pressure 104/76, pulse 66, weight 204  pounds.  She was not as anxious as usual today.  LUNGS: Clear.  COR:  Normal S1 and S2.  ABDOMEN:  Obese, soft, tender in the left lower quadrant with  normoactive bowel sounds.  RECTAL:  She has marked maceration of the perianal area, increased  rectal tone with some mild stricture.  I was unable to do the full  digital exam.  Stool was hemoccult negative.   IMPRESSION:  A 54 year old white female with functioning ileoproctostomy  and pouch with a recent exacerbation due to stress.   PLAN:  1. Continue Align 1 p.o.  daily.  2. Cipro 250 p.o. b.i.d. for 10 days with one refill.  3. Calmoseptine solution samples given.  4. Bentyl 20 mg p.o. b.i.d.  5. CBC and iron studies today.     [name]. Jeanette Perches, MD  Electronically Signed    DMB/MedQ  DD: 07/05/2007  DT: 07/06/2007  Job #: [id number]   cc:   Leone Haven, M.D.

## 2011-01-19 NOTE — Discharge Summary (Signed)
[name], [name]                   ACCOUNT NO.:  [id number]   MEDICAL RECORD NO.:  [id number]          PATIENT TYPE:  INP   LOCATION:  0240                         FACILITY:  [facility]   PHYSICIAN:  Biagio Borg, MD      DATE OF BIRTH:  01-Feb-1956   DATE OF ADMISSION:  12/15/2007  DATE OF DISCHARGE:  12/17/2007                               DISCHARGE SUMMARY   DISCHARGE DIAGNOSES:  Include:  1. Dehydration.  2. Acute renal insufficiency.  3. Transient hypokalemia.  4. Anemia.  5. Depression.  6. Gastroesophageal reflux disease.  7. Hyperlipidemia.  8. Hypertension.  9. History of esophagitis.  10.History of gastric polyp.  11.History of ulcerative colitis with recurrent pouchitis.  12.History of partial small obstruction in 2007.  13.Secondary hyperparathyroidism.  14.Anxiety.  15.Multiple surgeries as per admission surgical history.  16.Hyponatremia.  17.Transient leukocytosis of unclear etiology.   CONSULTS:  None.   PROCEDURES:  None.   HISTORY AND PHYSICAL:  See the document date of admission.   HOSPITAL COURSE:  [name] is a 55 year old white female who is now  seeing Dr. Glori Bickers at [facility] and Dr. [name] of  Gastroenterology, who presented on the date of admission with  dehydration for unclear reasons except she had been trying to reduce by  mouth  intake to help lose weight in the phase of ongoing diuretic use  and an increased activity recently.  She has had chronic mild  dehydration felt to be the source of her renal insufficiency in the past  and has had recurrent pouchitis complicating this as well.  She was  admitted on the date of admission with sodium 126 and potassium less  than 3.0 with marked renal insufficiency, acute on chronic.  She was  treated quite simply with potassium replacement and vigorous IV fluids,  to which she responded quite nicely over the next 36 hours.  Her  hemoglobin was in the normal range on admission and with dilution  remained stable at 10.2.  At the time of discharge, sodium returned to  134 and BUN and creatinine were, as suspected, baseline at 13 and 1.6.  Her hypokalemia had resolved with treatment.  As she was ambulatory,  eating well, afebrile, leucocytosis resolved, and no other problems  identified, she was felt to have gained maximum benefit from this  hospitalization and is to be discharged home.   DISPOSITION:  Discharged home in good condition.  No other activity or  dietary restrictions, except for low-sodium and heart-healthy diet.  She  is to follow up with her primary care physician in 1-2 weeks, and  Gastroenterology and Nephrology as previously recommended.   DISCHARGE MEDICATIONS:  To include:  1. Triamterene/HCTZ 37.5/25 half p.o. daily, which is half of the      admission dosage.  2. Cymbalta 6 mg 2 p.o. daily.  3. Vicodin p.r.n., which she takes sparingly.  4. Protonix 40 mg 1 p.o. daily.  5. Ciprofloxacin 500 mg p.o. b.i.d.  6. Hectorol 0.5 mcg p.o. daily.  7. A certain blood pressure medicine that  starts with N apparently      50 mg daily.      Biagio Borg, MD  Electronically Signed     JWJ/MEDQ  D:  12/17/2007  T:  12/18/2007  Job:  [id number]

## 2011-01-19 NOTE — Op Note (Signed)
NAMESTEPHIE, XU                   ACCOUNT NO.:  [id number]   MEDICAL RECORD NO.:  [id number]          PATIENT TYPE:  AMB   LOCATION:  DAY                          FACILITY:  [facility]   PHYSICIAN:  Odis Hollingshead, M.D.DATE OF BIRTH:  [birth year]   DATE OF PROCEDURE:  11/15/2008  DATE OF DISCHARGE:                               OPERATIVE REPORT   PREOPERATIVE DIAGNOSES:  1. Soft tissue mass left back.  2. Anal nodule.   POSTOPERATIVE DIAGNOSES:  1. Soft tissue mass left back.  2. Anal nodule.   PROCEDURE:  1. Excision of 4 cm left back soft tissue mass.  2. Excision of 1 cm anal nodule.   SURGEON:  Odis Hollingshead, M.D.   ANESTHESIA:  General, plus Marcaine local.   INDICATIONS:  A 55 year old female has been having a painful soft tissue  mass of the lower back, that is measured about 2 cm.  It is painful with  certain movements.  She also notes that she has what she feels is a  painful external hemorrhoid on her right side.  It is a very firm  nodule.  She now presents for excision.   TECHNIQUE:  She was brought to the operating room supine on the  stretcher, given general anesthetic.  She was then turned prone on the  operating table and appropriate areas of the body were padded.  The  buttocks were retracted with tape and the left lower back and anus were  sterilely prepped and draped with the 2 areas being draped separately.   I made an incision in the skin of the left lower back and dissected down  to the subcutaneous tissues, until I got close to  subfascial area.  Once I incised the fascia, a fairly large 4-cm  lipomatous  type mass was removed.  This was sent to pathology.  Following this I controlled bleeding with electrocautery.   Once bleeding was controlled, I then closed the wound in two layers  closing the subcutaneous tissue and fascia with running 3-0 Monocryl  suture.  Then closing the skin with 3-0 Monocryl subcuticular stitch.  Steri-Strips and a  sterile dressing were applied.   Next, I approached the anal nodule on the right side.  Using  electrocautery, I excised this nodule in a piecemeal fashion and sent to  pathology.  It was fairly friable.  I then controlled bleeding with  electrocautery and anesthetized the area with the 0.5% Marcaine with  epinephrine.  The area was hemostatic.  A bulky dressing was applied.   She subsequently was turned supine back on the stretcher, extubated and  brought to the  recovery room  in satisfactory condition.  There were no  apparent complications.      Odis Hollingshead, M.D.  Electronically Signed     TJR/MEDQ  D:  11/15/2008  T:  11/15/2008  Job:  [id number]

## 2011-01-19 NOTE — Assessment & Plan Note (Signed)
[facility]                         GASTROENTEROLOGY OFFICE NOTE   [name], [name] [facility]                        MRN:          [id number]  DATE:11/16/2007                            DOB:          May 11, 1956    [name] is a 55 year old white female with ulcerative colitis status  post total colectomy, ileoproctostomy and loop ileostomy with  reconnection.  Initial ileostomy in 1992.  Reconnection in August 1998  by Dr. Zella Richer.  She is status post cholecystectomy in 1988.  She has  renal insufficiency and intermittent pouchitis, which is controlled on  Cipro and Flagyl.  She had a flare up of her pouchitis last week and  responded appropriately to Cipro 250 p.o. b.i.d. and Flagyl 250 p.o.  t.i.d.  While having pouchitis, patient usually has up to 20 bowel  movements a day, but four or five at night, which are watery  and  sometimes incontinent.  She now is down to her baseline of six bowel  movements a day with one or two at night.  Patient's level of energy has  been somewhat decreasing lately.  Her iron saturation has been dropping  from 19% in December down to 17%, and her hemoglobin has dropped form  10.9 to 10.7 last week.  She has chronic GI blood loss from the pouch.  Patient is planning to apply for disability in the future because of the  issues with diarrhea incontinence.  Last flexible sigmoidoscopy examined  the pouch was in November 2005, which showed mild stenosis at the pouch  with some ongoing inflammation.   MEDICATIONS:  1. Effexor 75 mg 4 daily.  2. Protonix 40 mg p.o. daily.  3. Iron.  4. Maxzide.   PHYSICAL EXAMINATION:  Blood pressure 100/60, pulse 86, weight 204  pounds.  Patient was alert, oriented, in no distress.  Moderately  overweight.  LUNGS:  Clear to auscultation.  COR:  Normal S1, S2.  ABDOMEN:  Obese, soft with well healed surgical scar and some stretch  marks over her anterior abdomen.  There was tenderness in  left lower  quadrant, which has been unchanged in last several years.  Rest of the  abdomen was normal with normoactive bowel sounds.  RECTAL:  Exam not done today.   IMPRESSION:  1. 55 year old white female with intermittent pouchitis,      which is currently under good control with Cipro and Flagyl.  2. Iron deficiency anemia secondary to low grade GI blood loss.  3. Renal insufficiency.  4. Ulcerative colitis status post total colectomy and proctostomy.   PLAN:  1. Iron infusion scheduled.  2. Continue Cipro 250 p.o. b.i.d.  This needs to be on continuously      since patient has      recurrence of diarrhea within five days of stopping Cipro.  If the      Cipro stops being effective, I would alternate between Cipro and      Flagyl.     Lowella Bandy. Olevia Perches, MD  Electronically Signed    DMB/MedQ  DD: 11/16/2007  DT: 11/16/2007  Job #: [id number]   cc:   Leone Haven, M.D.

## 2011-01-22 NOTE — Letter (Signed)
March 07, 2008    [employer] manager, [employer].   RE:  [name], [name]  MRN:  [id number]  /  DOB:  [birth year]   .   Dear Moses Manners:   [name] has been a patient under my care for past 11 years .  She has  ulcerative colitis.  This letter is in support  of her  application for disability.  Ms. Wisniewski had a refractory ulcerative  colitis prior to her total colectomy by Dr. Zella Richer in 1998.  She  underwent  total colectomy with ileoproctostomy and ileal J-pouch.  She  had a long recovery complicated by severe diarrhea.  She has been now 11  years out of her surgery and still is having problems with her diarrhea  and incontinence.She had particular problem when  dealing with customers  or being on the phone .   Her additional medical problems include iron defifiency anemia requiring  intermittent iron infusions,  chronic renal insufficiency,followed by  renal service, hyperlipidemia, hypertension, and depression.  She also  has gastroesophageal reflux disease.  [name] is unable to work,  because stress of work makes her diarrhea worse.  She has tried  to hold  a job on many occasions, but her  medical condition has limited her  performance and ability to hold a job..  She has had accidents on  occasion while at work.  She may have rather unexpected urge to have a  bowel movement and not have time to make to the bathroom.  She often has  to interrupt her  conversation with the customer or clients.  She has required occasional  hospitalizations for dehydration because of fluid loss due to diarrhea.  She has had trouble to maintain her nutritional status and hydration on  occassions.  Her physical condition is  keeping her from holding a  steady job. I therefore support Mrs Bushes  request for long term  disability.    Sincerely,      Lowella Bandy. Olevia Perches, MD  Electronically Signed    DMB/MedQ  DD: 03/07/2008  DT: 03/08/2008  Job #: [id number]

## 2011-01-22 NOTE — H&P (Signed)
[name], GUYNES NO.:  [id number]   MEDICAL RECORD NO.:  [id number]          PATIENT TYPE:  EMS   LOCATION:  MAJO                         FACILITY:  [facility]   PHYSICIAN:  Gwenyth Ober, M.D.    DATE OF BIRTH:  10-06-1955   DATE OF ADMISSION:  07/30/2006  DATE OF DISCHARGE:                                HISTORY & PHYSICAL   A patient of Dr. Jackolyn Confer; she is being admitted to his service.   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   HISTORY OF PRESENT ILLNESS:  The patient's problems began earlier today when  she noted lack of what she would call normal bowel movements with just  liquid sort of coming through but no particulate matter as she would  normally.  This got worse where she started having abdominal pain with  cramping severe, up to a 10 out of 10, and she had one episode of severe  nausea about 10 o'clock this evening while she was in the emergency room.   A CT scan demonstrated what appeared to be a right-sided partial bowel  obstruction and a surgical consultation was obtained for admission as the  patient had been seen and operated upon by Dr. Zella Richer many years ago.   PAST MEDICAL HISTORY:  1. Mild hypertension.  2. Some renal insufficiency.  She is being followed by [facility]      Nephrology.   CURRENT MEDICATIONS:  Current medications include Protonix, Maxzide,  Cymbalta, Flonase, and Hectorol.   ALLERGIES:  SHE IS ALLERGIC TO SULFA MEDICATIONS AND MORPHINE.   PHYSICAL EXAMINATION:  GENERAL:  On examination currently, she has mild  abdominal distention.  VITAL SIGNS:  Stable.  Pulse 107.  Blood pressure 131/80.  She is afebrile.  HEENT:  She is normocephalic and atraumatic and anicteric.  NECK:  Supple.  CHEST:  Clear to auscultation.  CARDIAC EXAM:  Regular rhythm  and rate.  Mild tachycardia.  ABDOMEN:  Only mildly distended without any peritonitis.  However, she has  received some pain medication, I believe it is Dilaudid.  She has got  tinkles and rushes for bowel sounds.  No rectal exam was attempted.  Her  white blood cell count was 13,000.  Her hemoglobin is 12.8.  Electrolytes  are within normal limits.  Her creatinine is slightly up at 1.6.   IMPRESSION:  Possible partial small bowel obstruction without severe nausea  or vomiting, nonsevere dehydration.   PLAN:  Our plan is just to keep her NPO with exception of ice chips and let  her have some bowel rest and see if this resolves on its own.  However, if  not, then surgical intervention may be necessary.  We will sign her out to  Dr. Zella Richer on Monday.      Gwenyth Ober, M.D.  Electronically Signed     JOW/MEDQ  D:  07/31/2006  T:  07/31/2006  Job:  [id number] 

## 2011-01-22 NOTE — Discharge Summary (Signed)
[name], [name] [facility]                           ACCOUNT NO.:  [id number]   MEDICAL RECORD NO.:  [id number]                   PATIENT TYPE:  INP   LOCATION:  5727                                 FACILITY:  [facility]   PHYSICIAN:  Jerelene Redden, MD                   DATE OF BIRTH:  1956/02/09   DATE OF ADMISSION:  10/05/2002  DATE OF DISCHARGE:                                 DISCHARGE SUMMARY   HISTORY OF PRESENT ILLNESS:  The patient is a 55 year old lady who reports  that her mother had an episode of gastroenteritis over several days.  A  couple of days after her mother started having symptoms she began to notice  the onset of profuse diarrhea.  She had to have a bowel movement about every  hour.  This began on Tuesday and by Friday she was feeling quite weak, dizzy  and lightheaded.  She presented to Dr. Jodi Mourning office who noted that she had  significant postural hypotension.  He therefore it would be prudent to admit  her to the hospital for intravenous hydration.  There has been no fever or  cough, no chest pain, no dysuria.  The patient states that abdominal pain is  quite mild.  Of note is that in 1997, the patient was diagnosed with  ulcerative colitis and was found to have evidence of pancolitis by Dr.  Olevia Perches.  In 1998, she underwent a total colectomy by Dr. Zella Richer.   PHYSICAL EXAMINATION:  GENERAL:  Examination at the of admission revealed a  pleasant obese lady who reported that she had felt dizzy and lightheaded.  HEENT:  Exam was within normal limits.  CHEST:  The chest was clear.  CARDIOVASCULAR:  Normal S1 and S2 without rubs, murmurs or gallops.  ABDOMEN:  The abdomen was benign.  There were normal bowel sounds without  masses or tenderness.  There is a well-healed surgical scar.  NEUROLOGIC:  Examination of the extremities was normal.   LABORATORY DATA:  The potassium was 3.3, creatinine was 1.6, BUN was 24.  White count was 9100, hemoglobin 13.7.  Differential was  unremarkable.  The  amylase was 63.  Urinalysis was within normal limits.  Stool studies were  obtained.  The stools were enteric pathogens, of course, are pending at this  time.  A stool for C. difficile toxin was negative.  Stool for white cells  was negative.  Serum iron was 24.  Iron binding capacity was 414.  Ferritin  was 45.  The hemoglobin was within normal limits.  The patient was presumed  to have an acute viral gastroenteritis, particularly given the fact that  other family had similar symptoms.   HOSPITAL COURSE:  On admission, she was started on an IV of D5 and 1/2  normal saline at 150 mL/H with 10 mEq of KCl.  She was given Lomotil, one  with  each loose stool, not to exceed five daily.  The patient's vital signs  were monitored.  These remained stable.  The patient's blood pressure was  120-130/80-85.  By October 06, 2002, the patient was feeling a lot better.  She had noticed that the Lomotil really seemed to cut down on the frequency  of stools.  She was having no nausea or vomiting, and she felt that she  could get along successfully at home.  She already has a followup  appointment with Dr. Olevia Perches on Friday, which will be a good opportunity to  check and see how she is getting along.  One issue that was not addressed  was the patient's iron level.  As she is not anemic, I told her I thought it  would be reasonable to hold off on parenteral iron but this is something she  can discuss with Dr. Olevia Perches when she sees her on Friday.   DISCHARGE DIAGNOSES:  1. Viral gastroenteritis.  2. Secondary dehydration.  3. Mild hypokalemia.  4. History of ulcerative colitis, status post colectomy.  5. History of gastritis, Prevacid therapy.  6. Status post total abdominal hysterectomy and bilateral salpingo-     oophorectomy.  7. Status post cholecystectomy.   DISCHARGE MEDICATIONS:  1. Effexor XR 150 mg daily.  2. Prevacid 30 mg daily.  3. Maxzide p.r.n.  4. Ventolin p.r.n.   5. Serevent p.r.n.  6. Lomotil one with each loose stool, not to exceed five daily.   DISCHARGE CONDITION:  Good.                                               Jerelene Redden, MD    SY/MEDQ  D:  10/06/2002  T:  10/06/2002  Job:  [id number]   cc:   Dub Mikes P. Jacelyn Grip, M.D.  [address]  [place]  [place] [address]  Fax: [phone]   [name], M.D. [facility]

## 2011-01-22 NOTE — Procedures (Signed)
[facility]  Patient:    [name], [name] [facility] Visit Number: [id number] MRN: [id number]          Service Type: REC Location: [facility] Attending Physician:  Vincent Peyer Dictated by:   Lowella Bandy. Olevia Perches, M.D. [facility] Admit Date:  11/15/2001   CC:         Odis Hollingshead, M.D.  Eber Hong Geni Bers, M.D. at [facility] in [facility]   Procedure Report  PROCEDURE:  Upper endoscopy.  SURGEON:  Lowella Bandy. Olevia Perches, M.D.  INDICATIONS:  This is a 55 year old white female with ulcerative colitis, status post total colectomy and ileoproctostomy with a J-pouch several years ago.  Has had increasing indigestion and iron-deficiency anemia.  Hemoglobin 9.5, MCV 77.  She denies any visible blood per rectum.  She has had increased gastroesophageal reflux.  She is now undergoing upper endoscopy to further evaluate her iron-deficiency anemia.  Her symptoms of reflux improved markedly after she was started on Protonix 40 mg a day last week.  ENDOSCOPE:  Olympus single channel video endoscope.  SEDATION:  Versed 10 mg IV and Demerol 100 mg IV.  FINDINGS:  The Olympus single channel video endoscope was passed under direct vision through the posterior pharynx into the esophagus.  The patient was monitored by pulse oximeter.  Oxygen saturations were normal.  Proximal and distal esophagus was unremarkable with the exception of the GE junction which showed short erosions.  It is felt consistent with grade 1 esophagitis.  There was no significant hiatal hernia or stricture.  Biopsies were taken from the GE junction to rule out Barretts esophagus.  Stomach:  The patient was insufflated with air and showed normal-appearing gastric mucosa.  There were several tiny polyps in the proximal stomach, most likely related to use of PPIs.  The gastric antrum showed mild erythema.  The pyloric outlet was normal.  Biopsies were taken for CLOtest.  Duodenum:  The duodenal bulb was unremarkable.   There is a normal descending duodenum.  It showed some patchy-appearing mucosa.  Because of the patients diarrhea, small biopsies were taken to rule out gross atrophy.  The endoscope was then retracted from the stomach and decompressed.  The patient tolerated the procedure well.  IMPRESSION: 1. Grade 1 esophagitis. 2. Status post small bowel biopsies for evaluation of diarrhea. 3. Minimal gastritis, status post CLOtest. 4. Gastric polyps.  PLAN: 1. Continue Protonix 40 mg a day. 2. Await the results of the biopsies. 3. The patient is for iron infusion today, 1000 mg IV because of intolerance    to oral iron. Dictated by:   Lowella Bandy. Olevia Perches, M.D. [place] Attending Physician:  Vincent Peyer DD:  11/15/01 TD:  11/16/01 Job: [id number]

## 2011-01-22 NOTE — Discharge Summary (Signed)
[name], [name]                   ACCOUNT NO.:  [id number]   MEDICAL RECORD NO.:  [id number]          PATIENT TYPE:  INP   LOCATION:  5742                         FACILITY:  [facility]   PHYSICIAN:  Sammuel Hines. Marlou Starks, M.D.     DATE OF BIRTH:  [birth year]   DATE OF ADMISSION:  07/30/2006  DATE OF DISCHARGE:  08/03/2006                               DISCHARGE SUMMARY   ADMITTING PHYSICIAN:  Dr. Hulen Skains.   PRIMARY SURGEON:  Dr. Jackolyn Confer.   DISCHARGING PHYSICIAN:  Dr. Marlou Starks.   CHIEF COMPLAINT AND REASON FOR ADMISSION:  [name] is a 49-year female  patient with prior history of abdominal surgery.  She is status post  total colectomy and ileoproctostomy with a J pouch.  She presented to  the ER with abdominal pain, nausea and vomiting.  On the date of  admission, she began having what she described as normal bowel movements  with liquid coming out around them.  She increased in frequency and  severity of these bowel movements that became more liquid, and she has  severe cramping pain, 10 out of 10 and severe nausea and finally  presented to the ER late in the evening at 10:00 on July 30, 2006.  She was eventually evaluated by surgery early that morning of June 30, 2006 after CT scan demonstrated right-sided partial small-bowel  obstruction.  On clinical exam, she was stable.  She was mildly  tachycardic with a heart rate of 107.  Her abdomen was mildly distended  with tinkling, rushing bowel sounds.  Rectal exam was deferred.  The  patient had received pain medications, so difficult to determine if any  peritoneal signs.  White count was elevated at 14,000, her hemoglobin  was 12.8, creatinine was mildly elevated 1.6.   ADMITTING DIAGNOSES.:  1. Partial small-bowel obstruction.  2. Nausea and vomiting.  3. Mild dehydration.   HOSPITAL COURSE:  The patient was admitted to the general medical floor  where she was placed on n.p.o. status, IV fluids and bowel rest.  She  continued  to have significant abdominal distension and discomfort, so by  the morning of July 31, 2006 around 8:30, she was evaluated by Dr.  ___________ who felt the patient would benefit from an NG tube.  NG tube  was placed, and after this point, serial x-rays were followed   On August 01, 2006, the patient was evaluated that morning.  Her white  count had improved to 9600.  Her potassium was slightly low at 3.2.  Unfortunately, the patient had been soft plugging her NG tube beginning  at 9 p.m. the night before because she was having difficulty sleeping,  and since her abdomen was softer and she was having decreased pain and  looser stools, she felt she had improved and this would be an option.  She did not tell the nursing staff she had done this.  She was also  taking in a significant amount of ice chips and some water and had an NG  output of about 1000 mL, presumed large output secondary  to oral intake.   On exam, her abdomen was soft and tender in the lower abdomen.  She had  high-pitched rolling bowel sounds in the right upper quadrant.  At this  point, I counseled the patient on rationale for continuing NG tube to  low wall suction.  This was resumed.  The patient was put on ice chips  only and counseled about why she had to have this, and she was given IV  repletion for her low potassium.   By August 02, 2006, the patient's x-rays had improved markedly.  She  only had 60 mL out from her NG tube over 24 hours.  She was having  flatus and BMs, and she was only mildly tender in her abdomen with good  bowel sounds, so she was started on clear liquid diet, and on the  morning of discharge, the patient had been started on a full liquid  diet, was tolerating well without nausea, vomiting or abdominal pain.  She was requesting discharge home.  The patient was counseled on  remained a soft diet with low residue avoiding popcorn, kernel corn, and  any food with nuts or seeds, and it is  recommended that she follow up  with Dr. Zella Richer in 10 to 14 days.  The patient tells me that over a  period of 6-12 months she has had intermittent problems with mild lower  abdominal pain, bloating and symptoms that would resolve.  According to  this history she has given me, the patient sounds like she has been  having intermittent problems with recurrent partial small-bowel  obstructions, and given her history of multiple abdominal procedures, I  am concerned that she will eventually require laparotomy for lysis of  adhesions in the future.  Therefore, I wish she would follow closely up  with Dr. Zella Richer.      Lyons Lissa Merlin, N.P.      ______________________________  Sammuel Hines. Marlou Starks, M.D.    ALE/MEDQ  D:  08/03/2006  T:  08/03/2006  Job:  [id number]   cc:   Lowella Bandy. Olevia Perches, MD

## 2011-01-22 NOTE — Assessment & Plan Note (Signed)
[name]                                   ON-CALL NOTE   PHEOBE, SANDIFORD [facility]                        MRN:          [id number]  DATE:07/30/2006                            DOB:          December 15, 1955    On call phone note.  This is a patient of Dr. Sydell Axon [name]'s.  [name] called today at 4:15. She has been well until this morning. This  morning she ate a relatively normal breakfast and than has felt some  abdominal discomfort, some nausea. She has a J pouch for previous ulcerative  colitis and normally has several very loose stools on a daily basis. She has  had a bit of loose stool, but not her normal. She has had no fevers or  chills. She has not thrown up. She had some abdominal discomfort but she  does not feel her belly is distended. She feels that she probably has a  bowel obstruction. She has never had a bowel obstruction previously.  She certainly may have a bowel obstruction. I recommended that she go to the  emergency room for films and lab tests. She said she just wants to see how  things go for the next few hours and so I told her to limit her diet to  liquids only, but that if things got worse over the next few hours to call  back or go into the emergency room.     Milus Banister, MD  Electronically Signed    DPJ/MedQ  DD: 07/30/2006  DT: 07/30/2006  Job #: [id number]

## 2011-01-22 NOTE — Assessment & Plan Note (Signed)
[facility]                           GASTROENTEROLOGY OFFICE NOTE   MAYTAL, MIJANGOS [facility]                        MRN:          [id number]  DATE:05/16/2006                            DOB:          11/10/1955    HISTORY OF PRESENT ILLNESS:  [name] is a 55 year old white female with  ulcerative pancolitis status post colectomy, ileoproctostomy, and loop  ileostomy with reconnection in August 1998.  She also had cholecystectomy in  1988.  She has mild renal insufficiency, and we have followed her as well  for iron deficiency anemia with low-grade GI blood loss most likely related  to her GI tract.  She has had iron infusions; last infusion January 2007.  Her last hemoglobin was satisfactory at 12 g.  She is seeing Dr. Caprice Beaver  from a psychiatric standpoint because of depression, and has been on  Cymbalta 60 mg daily which seems to be really helping her greatly.  She also  sees Dr. Marval Regal in Nephrology for microscopic hematuria. We are seeing  her today because of pouchitis.  In July, the patient called with diarrhea  and a low abdominal pain, and we put her on Flagyl 250 t.i.d. and Cipro 250  b.i.d. with complete resolution of symptoms within a week to 10 days.  She  has now been well for several weeks.  There is some problem with continued  diarrhea, having about 8-10 bowel movements a day, usually 8 during the  daytime and 2 at night.  No rectal bleeding.  There is some rectal  irritation for which she takes Anusol or cream.   PHYSICAL EXAMINATION:  VITAL SIGNS:  Blood pressure 110/90, pulse 62, weight  192.8 pounds.  GENERAL:  She was alert and oriented, no distress.  ABDOMEN:  Large, soft, nontender with minimal discomfort in the left lower  quadrant.  Normoactive bowel sounds.  RECTAL:  Not done today.   IMPRESSION:  10. 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  2. Rectal  irritation likely related to anusitis from diarrhea.  3. Anxiety/depression, being treated, currently  under good control.  4. Iron deficiency anemia.  Hemoccult positive stool.  The patient is      currently not anemic, but we will have to observe her H&H and infuse      iron as needed since she is intolerant to all iron.  5. History of ulcerative colitis status post total colectomy and      ileoproctostomy.   PLAN:  1. Continue current medications.  2. Cipro 250 p.o. b.i.d.  New prescription given to use only on p.r.n.      basis.  3. Calmoseptine ointment, samples given to use for rectal irritation.  She      will need CBC at least every 6-8 weeks as well as iron levels to      determine need for iron infusion.  Lowella Bandy. Olevia Perches, MD   DMB/MedQ  DD:  05/16/2006  DT:  05/17/2006  Job #:  [id number]   cc:   Leone Haven, M.D.  Donato Heinz, M.D.  Sheralyn Boatman, M.D.

## 2011-01-22 NOTE — Op Note (Signed)
NAMEBRITNY, RIEL                   ACCOUNT NO.:  [id number]   MEDICAL RECORD NO.:  [id number]          PATIENT TYPE:  AMB   LOCATION:  [place]                          FACILITY:  [facility]   PHYSICIAN:  Odis Hollingshead, M.D.DATE OF BIRTH:  [birth year]   DATE OF PROCEDURE:  09/29/2004  DATE OF DISCHARGE:                                 OPERATIVE REPORT   PREOPERATIVE DIAGNOSIS:  Soft tissue mass, right lower back, paralumbar  region.   POSTOPERATIVE DIAGNOSIS:  Soft tissue mass, right lower back, paralumbar  region (6 cm).   PROCEDURE:  Excision of lipomatous soft tissue mass, right lower back.   ANESTHESIA:  Local (1% lidocaine mixed with 0.5% Marcaine).   INDICATION:  This 55 year old female has been noticing a soft tissue mass  that is painful, especially when she sits or moves around.  She now presents  for excision of it.  The procedure and the risks were discussed with her  preoperatively.   TECHNIQUE:  She is placed prone on the minor procedure room table.  The soft  tissue mass was identified by physical exam and marked with a marking pen.  The area was then sterilely prepped and draped.  Local anesthetic was  infiltrated in the right lower back paralumbar region and a transverse  incision was made through the skin and subcutaneous tissue.  The mass was  identified deep near the musculature of the back.  I extended the incision a  little bit laterally.  Using careful blunt and sharp dissection, I was able  to excise the majority of the mass, but some of it was fairly adherent to  the underlying muscle fascia and I took this out in smaller pieces.  In  toto, it measures about 6 cm.   I irrigated the wound and used direct pressure for hemostasis.  Once  hemostasis was adequate, I closed the wound in two layers using a running 3-  0 Monocryl to close the subcutaneous tissue and a running 3-0 Monocryl  subcuticular stitch to close the skin.  Steri-Strips and a sterile dressing  were applied.   She tolerated the procedure well, without any apparent complications.  She  was released from the minor procedure room in satisfactory condition.  She  was given discharge instructions and Darvocet for pain, and told to follow  up with me in one to two weeks.      TJR/MEDQ  D:  09/29/2004  T:  09/29/2004  Job:  [id number]

## 2011-02-19 ENCOUNTER — Other Ambulatory Visit: Payer: Self-pay | Admitting: *Deleted

## 2011-02-19 MED ORDER — HYDROCODONE-ACETAMINOPHEN 5-500 MG PO TABS: ORAL_TABLET | ORAL | Status: DC

## 2011-02-24 ENCOUNTER — Encounter: Payer: Self-pay | Admitting: Internal Medicine

## 2011-02-24 ENCOUNTER — Ambulatory Visit (INDEPENDENT_AMBULATORY_CARE_PROVIDER_SITE_OTHER): Payer: Medicare Other | Admitting: Internal Medicine

## 2011-02-24 VITALS — BP 132/80 | HR 92 | Ht 64.0 in | Wt 157.0 lb

## 2011-02-24 DIAGNOSIS — K5100 Ulcerative (chronic) pancolitis without complications: Secondary | ICD-10-CM

## 2011-02-24 DIAGNOSIS — K91850 Pouchitis: Secondary | ICD-10-CM

## 2011-02-24 MED ORDER — MESALAMINE 1000 MG RE SUPP: 1000.0000 mg | Freq: Every day | RECTAL | Status: DC

## 2011-02-24 NOTE — Progress Notes (Signed)
[name] Mar 13, 1956 MRN [id number]        History of Present Illness:  This is a 55 year old white female with a history of ulcerative colitis, status post total colectomy and ileoproctostomy in 1998. She has history of pouchitis which is under good control with Cipro 250 mg daily. She stopped taking Canasa suppositories  Last flexible sigmoidoscopy in June 2009 showed pouchitis and chronic active inflammation. She has periodic diarrhea and iron deficiency anemia. She has stopped water aerobics because of rectal leakage. She has had an increase in  mucous discharge and small amount of stool from the rectum. She takes Vicodin for left lower quadrant abdominal pain which in the past showed is due to distended loop of  small bowel displaced into  the left lower quadrant. By pressing on her left lower quadrant she can evacuate the liquid stool and feels better   Past Medical History  Diagnosis Date  . History of small bowel obstruction   . Obesity   . Hyperparathyroidism   . Hypokalemia   . Hypertension   . Gastric polyp   . GERD (gastroesophageal reflux disease)   . Depression   . Anxiety   . Anemia   . Renal insufficiency   . Ulcerative colitis   . RLS (restless legs syndrome)   . Pulmonary nodule, right     right upper lobe  . Anal stenosis   . Esophagitis   . De Quervain's tenosynovitis   . Hyperlipidemia    Past Surgical History  Procedure Date  . Anal dilation   . Restorative proctocolectomy     with insertion of ileoanal J Pouch with loop ileostomy  . Ileostomy closure   . Cholecystectomy   . Tubal ligation   . Total abdominal hysterectomy   . Hemorrhoid surgery   . Fatty tumor removed from back     reports that she has quit smoking. She does not have any smokeless tobacco history on file. She reports that she drinks alcohol. She reports that she does not use illicit drugs. family history includes Heart disease in her father; Irritable bowel syndrome in an  unspecified family member; and Ulcerative colitis in her daughter and father.  There is no history of Colon cancer. Allergies  Allergen Reactions  . Morphine   . Sulfonamide Derivatives         Review of Systems: Denies any symptoms of gastroesophageal reflux. She has gained 10 pounds. Positive for abdominal pain and diarrhea  The remainder of the 10  point ROS is negative except as outlined in H&P   Physical Exam: General appearance  Well developed in no distress Eyes- non icteric HEENT nontraumatic, normocephalic Mouth no lesions, tongue papillated, no cheilosis Neck supple without adenopathy, thyroid not enlarged, no carotid bruits, no JVD Lungs Clear to auscultation bilaterally Cor normal S1 normal S2, regular rhythm , no murmur,  quiet precordium Abdomen soft tender in left lower quadrant. No hernia. Well-healed surgical scars Rectal: Perianal dermatitis with felt mildly macerated mucosa and irritation. Skin tag at rectovaginal area tender anal canal no hemorrhoids Extremities no pedal edema Skin no lesions Neurological alert and oriented x3 Psychological normal mood and  affect  Assessment and Plan:  Ulcerative colitis status post total colectomy and ileoproctostomy. She has chronic pouchitis. She will restart her Canasa suppositories thousand milligrams at bedtime and continue on Cipro 250 mg daily. She will also alternate Cipro with Flagyl 250 mg a day. She needs to continue her iron supplements since  her last hemoglobin was  Only 11.3 and iron saturation was 16%. She will continue Protonix and Lomotil for diarrhea.   02/24/2011 [name]

## 2011-02-24 NOTE — Patient Instructions (Signed)
We sent canasa suppositories to your pharmacy.

## 2011-02-27 ENCOUNTER — Other Ambulatory Visit: Payer: Self-pay | Admitting: Internal Medicine

## 2011-03-12 ENCOUNTER — Telehealth: Payer: Self-pay | Admitting: Internal Medicine

## 2011-03-12 NOTE — Telephone Encounter (Signed)
Patient calling to report she is having more heart burn and burning between her breast lately. States she is under a lot of stress while taking care of her grandchildren this summer. States her bottom is raw also. She thinks the stomach acid is causing her bottom to be raw too. She wants to increase her Protonix to BID. She will try this for one week and call us back with an update on condition. She is also using her Canasa supp. Nightly. Hx ulcerative colitis s/p total colectomy, ileoproctostomy, pouchitis. Last flex- 2009- pouchitis, chronic active inflammation.

## 2011-03-15 NOTE — Telephone Encounter (Signed)
I agree with increasing her PPI to bid dose.

## 2011-03-16 ENCOUNTER — Telehealth: Payer: Self-pay | Admitting: *Deleted

## 2011-03-16 NOTE — Telephone Encounter (Signed)
Patient calling to see if Dr. Olevia Perches will refill her Hydrocodone early since she has used more this month. Patient last had # 30 refilled on 02/21/11. Is it okay to refill this early? Please, advise.

## 2011-03-17 NOTE — Telephone Encounter (Signed)
Patient calling again to see if she can have her Hydrocodone early.

## 2011-03-17 NOTE — Telephone Encounter (Signed)
May refill early but has to last x 30 days. No early refill next month.

## 2011-03-18 ENCOUNTER — Telehealth: Payer: Self-pay | Admitting: Internal Medicine

## 2011-03-18 MED ORDER — HYDROCODONE-ACETAMINOPHEN 5-500 MG PO TABS: ORAL_TABLET | ORAL | Status: DC

## 2011-03-18 NOTE — Telephone Encounter (Signed)
Spoke with patient and called her pharmacy to clairfy rx.

## 2011-03-18 NOTE — Telephone Encounter (Signed)
Spoke with patient and told her we will refill this month early but will NOT do this next month. Rx sent to pharmacy

## 2011-03-22 ENCOUNTER — Telehealth: Payer: Self-pay | Admitting: Internal Medicine

## 2011-03-22 NOTE — Telephone Encounter (Signed)
Patient calling to ask if she can take the Canasa suppository twice/day. She states she has done this in the past and wants to know if she can try this again. Please, advise.

## 2011-03-22 NOTE — Telephone Encounter (Signed)
OK to take Canasa supp bid, please send prescription for large amount

## 2011-03-23 MED ORDER — MESALAMINE 1000 MG RE SUPP: RECTAL | Status: DC

## 2011-03-23 NOTE — Telephone Encounter (Signed)
Patient notified of Dr. Nichola Sizer recommendations. Rx sent to [facility] per patient request.

## 2011-03-24 ENCOUNTER — Telehealth: Payer: Self-pay | Admitting: *Deleted

## 2011-03-24 NOTE — Telephone Encounter (Signed)
[facility] Triage Call Report Triage Record Num: [id number] Operator: Ozella Almond Patient Name: [name] Call Date & Time: 03/23/2011 10:27:41PM Patient Phone: [phone] PCP: Rosalita Chessman Patient Gender: Female PCP Fax : [phone] Patient DOB: Aug 08, 1956 Practice Name: [facility] Reason for Call: Audrea Muscat calling regarding possible UTI. Pt c/o painful urination, has hesitancy, onset of s/s 03/23/11. Afebrile. Wiped small amout of blood on toilet tissue. Emergent s/s for Urinary Symptoms r/o per protocol except for see in 24 hours due to more than one urinary symptom and no evaluation, will call for appt in 24 hours. Protocol(s) Used: Urinary Symptoms - Female Recommended Outcome per Protocol: See Provider within 24 hours Reason for Outcome: Has one or more urinary tract symptoms AND has not been previously evaluated Care Advice: ~ Call provider if you develop flank or low back pain, fever, generally feel sick. Increase intake of fluids. Try to drink 8 oz. (.2 liter) every hour when awake, including unsweetened cranberry juice, unless on restricted fluids for other medical reasons. Take sips of fluid or eat ice chips if nauseated or vomiting. ~ ~ SYMPTOM / CONDITION MANAGEMENT

## 2011-03-24 NOTE — Telephone Encounter (Signed)
Left message to call office

## 2011-03-25 NOTE — Telephone Encounter (Signed)
Pt still c/o frequent urination, and back pain. Pt notes that at time urine has a reddish color. Pt schedule for OV in am.

## 2011-03-26 ENCOUNTER — Ambulatory Visit: Payer: Self-pay | Admitting: Family Medicine

## 2011-04-04 ENCOUNTER — Other Ambulatory Visit: Payer: Self-pay | Admitting: Internal Medicine

## 2011-04-09 ENCOUNTER — Telehealth: Payer: Self-pay | Admitting: Internal Medicine

## 2011-04-09 DIAGNOSIS — D509 Iron deficiency anemia, unspecified: Secondary | ICD-10-CM

## 2011-04-09 NOTE — Telephone Encounter (Signed)
States she is feeling tired all the time and wants to sleep all the time. States she had rectal bleeding x 1 a couple of nights ago but none since. Wants to have her iron level checked. Please, advise.

## 2011-04-09 NOTE — Telephone Encounter (Signed)
Patient called back to report that she had blood in her urine and felt like she had to urinate but could not several weeks ago also. She thinks she "passed a kidney stone." She reports she does not have a history of kidney stones She has not had any more blood in urine but reports . She will call her kidney doctor about this. She also states the rectal bleeding that occurred a couple of nights ago was blood on a pad that she wears at night. She states she "knows the blood came from the rectum because of where it was on the pad." Reports she has not had any more rectal bleeding but is feeling tired and sleepy all the time. Wants to have her iron level checked. Please, advise.

## 2011-04-09 NOTE — Telephone Encounter (Signed)
OK to check  CBC, iron ,TIBC,

## 2011-04-12 ENCOUNTER — Other Ambulatory Visit (INDEPENDENT_AMBULATORY_CARE_PROVIDER_SITE_OTHER): Payer: Self-pay

## 2011-04-12 DIAGNOSIS — D509 Iron deficiency anemia, unspecified: Secondary | ICD-10-CM

## 2011-04-12 LAB — CBC WITH DIFFERENTIAL/PLATELET
Basophils Absolute: 0 10*3/uL (ref 0.0–0.1)
Basophils Relative: 0.6 % (ref 0.0–3.0)
Eosinophils Absolute: 0.4 10*3/uL (ref 0.0–0.7)
Eosinophils Relative: 4.3 % (ref 0.0–5.0)
HCT: 35.6 % — ABNORMAL LOW (ref 36.0–46.0)
Hemoglobin: 12 g/dL (ref 12.0–15.0)
Lymphocytes Relative: 38 % (ref 12.0–46.0)
Lymphs Abs: 3.3 10*3/uL (ref 0.7–4.0)
MCHC: 33.8 g/dL (ref 30.0–36.0)
MCV: 94.3 fl (ref 78.0–100.0)
Monocytes Absolute: 0.6 10*3/uL (ref 0.1–1.0)
Monocytes Relative: 7.3 % (ref 3.0–12.0)
Neutro Abs: 4.3 10*3/uL (ref 1.4–7.7)
Neutrophils Relative %: 49.8 % (ref 43.0–77.0)
Platelets: 391 10*3/uL (ref 150.0–400.0)
RBC: 3.78 Mil/uL — ABNORMAL LOW (ref 3.87–5.11)
RDW: 13.8 % (ref 11.5–14.6)
WBC: 8.6 10*3/uL (ref 4.5–10.5)

## 2011-04-12 LAB — IBC PANEL
Iron: 66 ug/dL (ref 42–145)
Saturation Ratios: 23.7 % (ref 20.0–50.0)
Transferrin: 198.9 mg/dL — ABNORMAL LOW (ref 212.0–360.0)

## 2011-04-12 NOTE — Telephone Encounter (Signed)
Labs in EPIC. Patient notified and she will come in for her labs.

## 2011-04-13 ENCOUNTER — Telehealth: Payer: Self-pay | Admitting: *Deleted

## 2011-04-13 ENCOUNTER — Encounter (HOSPITAL_COMMUNITY): Payer: MEDICARE | Admitting: Physician Assistant

## 2011-04-13 NOTE — Telephone Encounter (Signed)
Message copied by Hulan Saas on Tue Apr 13, 2011  9:34 AM ------      Message from: Lafayette Dragon      Created: Mon Apr 12, 2011  9:21 PM       Please call pt with normal blood count and also normal iron levels. See her PCP to find out why she is so tired.

## 2011-04-13 NOTE — Telephone Encounter (Signed)
Patient notified of results and Dr. [name]'s recommendation. 

## 2011-04-15 ENCOUNTER — Other Ambulatory Visit: Payer: Self-pay | Admitting: Cardiology

## 2011-04-15 ENCOUNTER — Other Ambulatory Visit: Payer: Self-pay | Admitting: *Deleted

## 2011-04-15 MED ORDER — HYDROCODONE-ACETAMINOPHEN 5-500 MG PO TABS: ORAL_TABLET | ORAL | Status: DC

## 2011-04-15 NOTE — Telephone Encounter (Signed)
CHART REVIEWED AND DOSE / STRENGTH VERIFIED. PT NEEDS OV/ PLACED ON SCRIPT

## 2011-04-28 ENCOUNTER — Ambulatory Visit
Admission: RE | Admit: 2011-04-28 | Discharge: 2011-04-28 | Disposition: A | Discharge status: Home / Self Care | Payer: Medicare Other | Source: Ambulatory Visit | Attending: Nephrology | Admitting: Nephrology

## 2011-04-28 ENCOUNTER — Other Ambulatory Visit: Payer: Self-pay | Admitting: Nephrology

## 2011-04-28 DIAGNOSIS — R109 Unspecified abdominal pain: Secondary | ICD-10-CM

## 2011-04-28 IMAGING — CT CT ABD-PELV W/O CM
3 of 4 series · 13 of 36 positions shown, 19 images · non-contrast
Comparison: [DATE]

CLINICAL DATA: Left flank pain.  Gross hematuria.  Ulcerative
colitis.  Previous total colectomy with ALTENHOFEN.

CT ABDOMEN AND PELVIS WITHOUT CONTRAST
TECHNIQUE: Multidetector CT imaging of the abdomen and pelvis was
performed following the standard protocol without intravenous
contrast.

[Series 3: renal stone · axial · 0.78mm/px · z∈[-362,-27]mm · 8 of 87 slices shown, 13 images]
[im 10/87  soft-tissue]
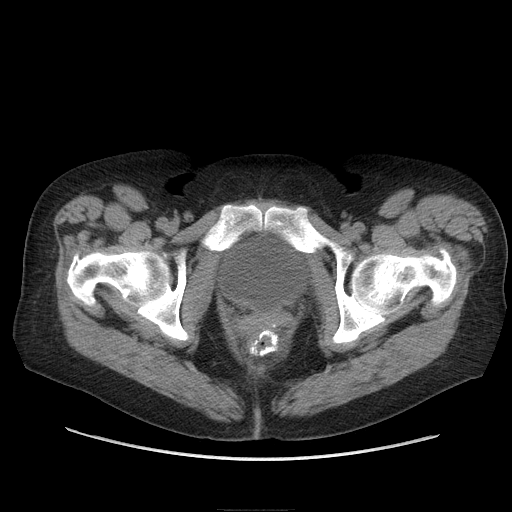
[im 10/87  bone]
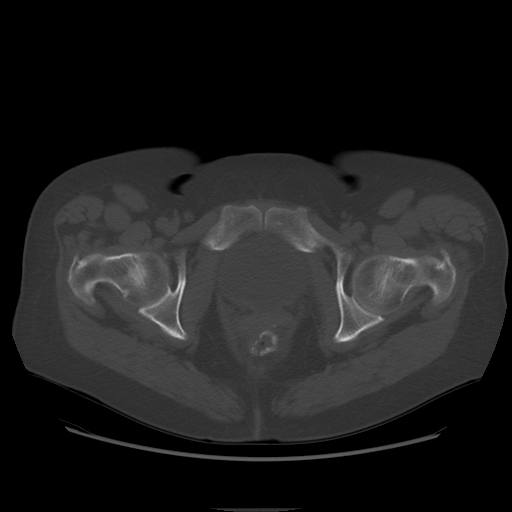
[im 20/87  soft-tissue]
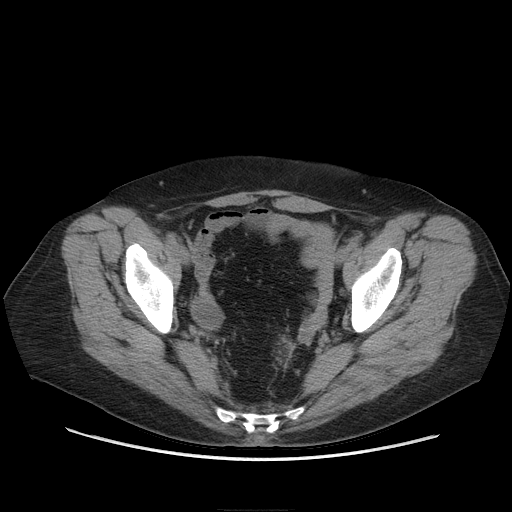
[im 29/87  soft-tissue]
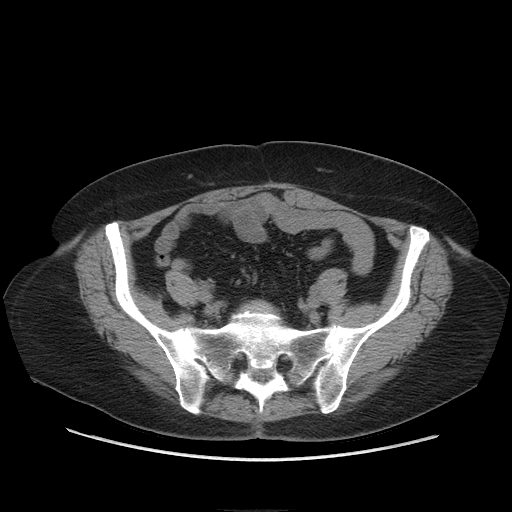
[im 39/87  soft-tissue]
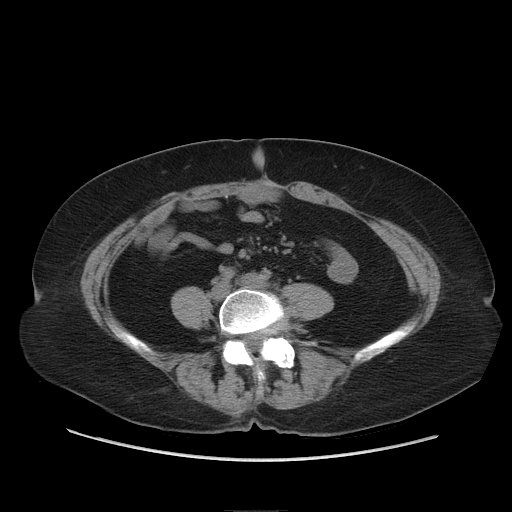
[im 48/87  soft-tissue]
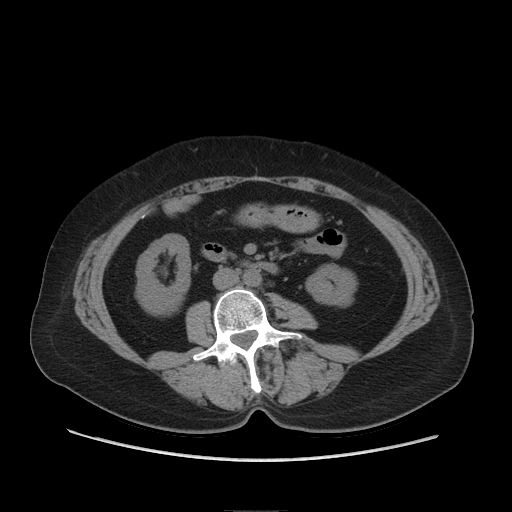
[im 48/87  lung]
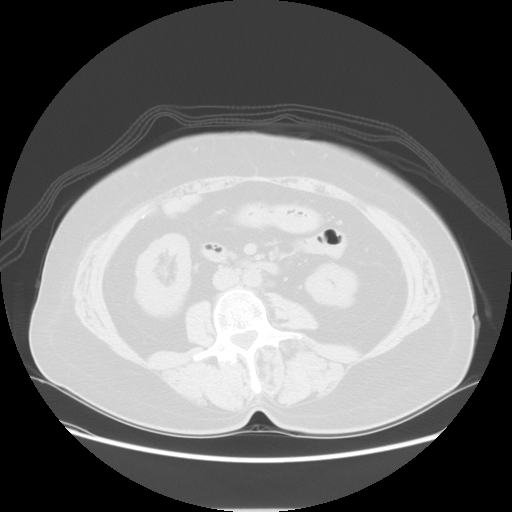
[im 58/87  soft-tissue]
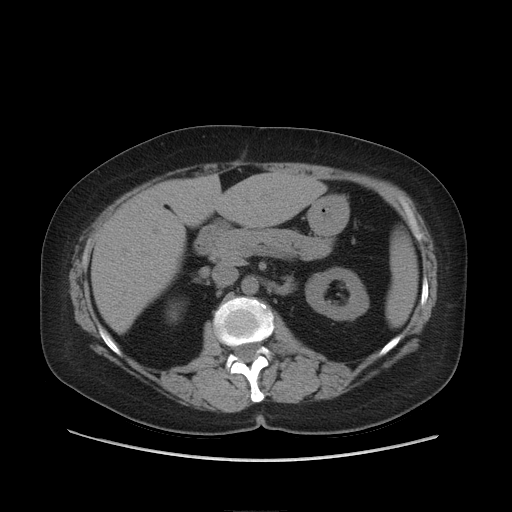
[im 58/87  lung]
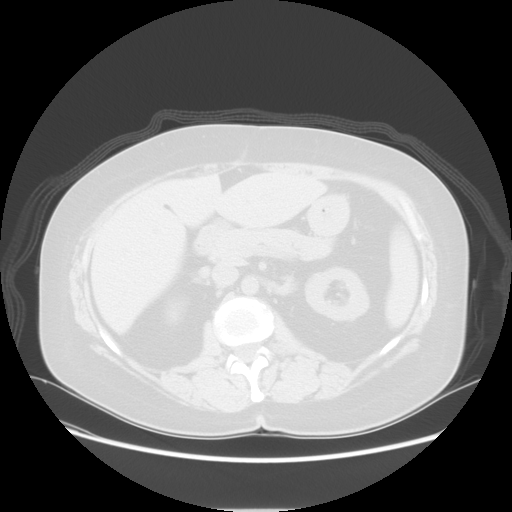
[im 67/87  soft-tissue]
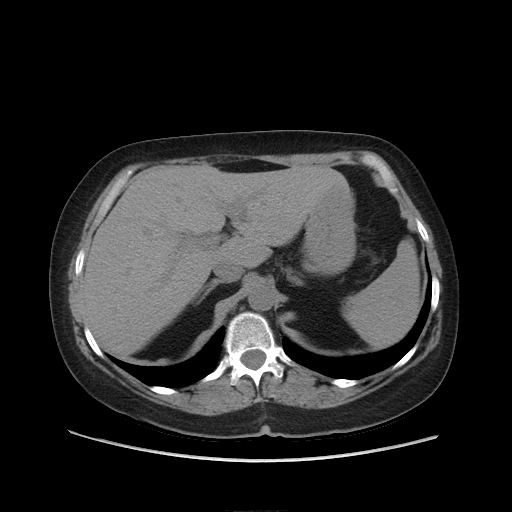
[im 67/87  lung]
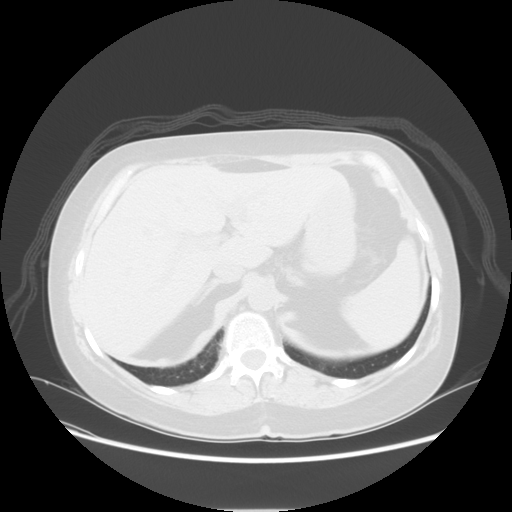
[im 77/87  soft-tissue]
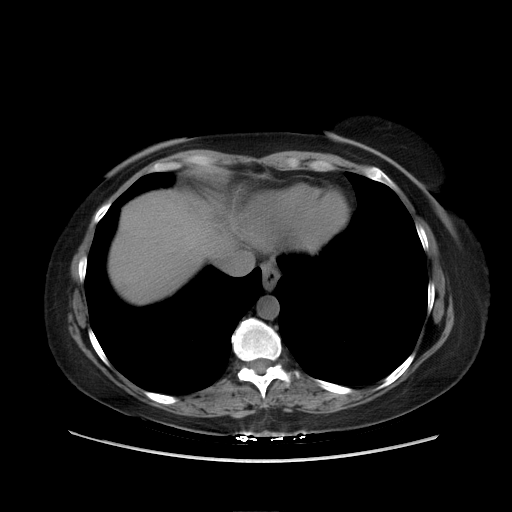
[im 77/87  lung]
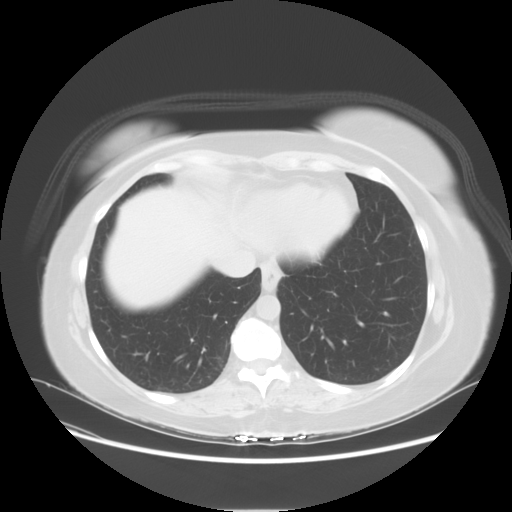

[Series 601: coronal body · coronal · 0.86mm/px · 1 of 115 slices shown, 2 images]
[im 39/115  soft-tissue]
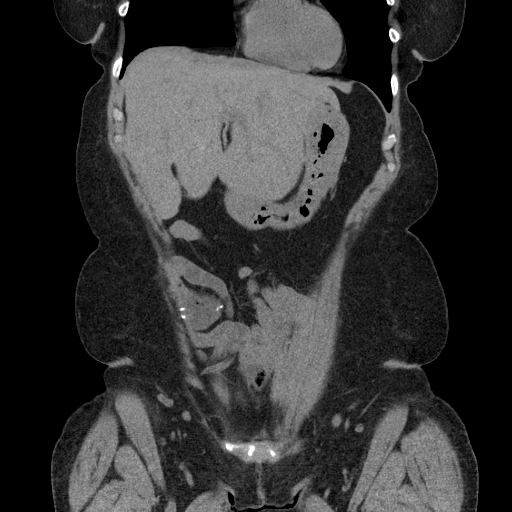
[im 39/115  bone]
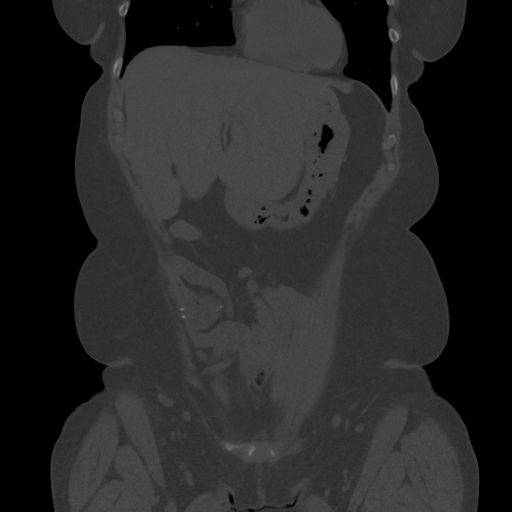

[Series 602: sagittal body · sagittal · 0.86mm/px · 4 of 160 slices shown]
[im 18/160  soft-tissue]
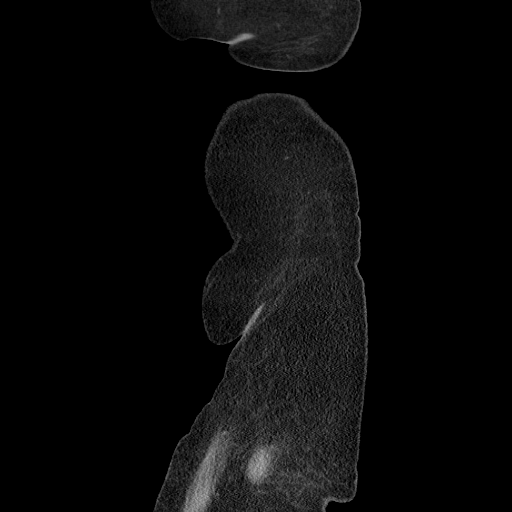
[im 36/160  soft-tissue]
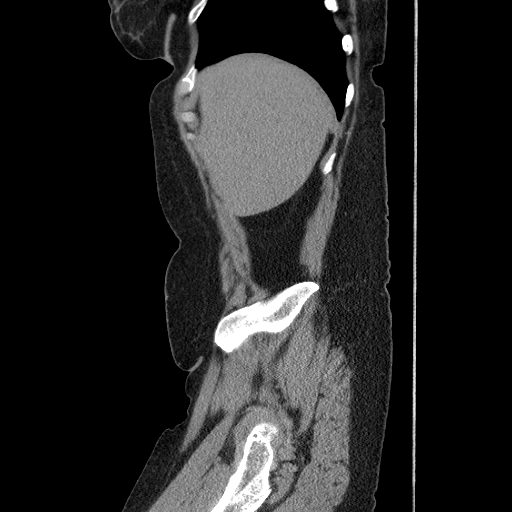
[im 54/160  soft-tissue]
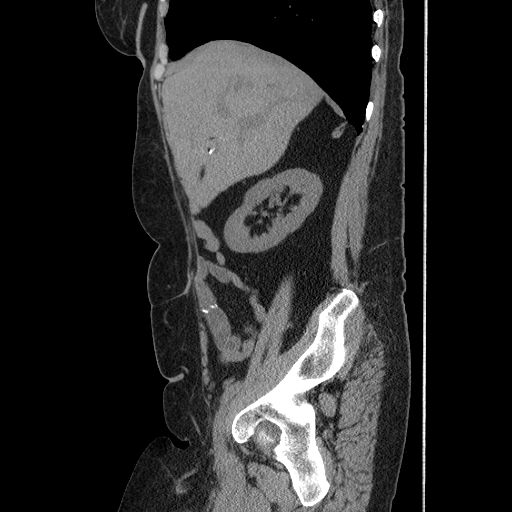
[im 71/160  soft-tissue]
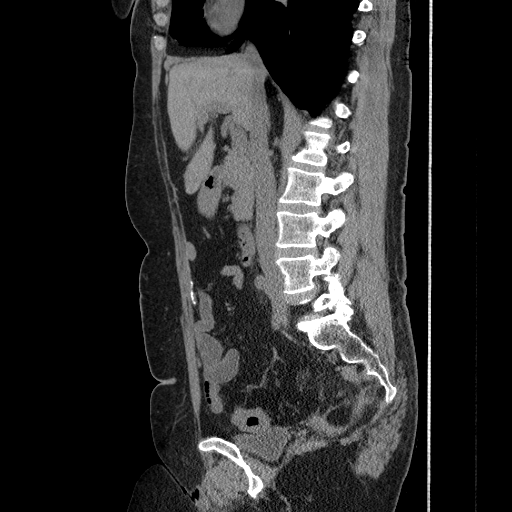

[13 of 36 positions shown; findings below may reference images not displayed]

FINDINGS: Postoperative changes are seen from prior colectomy with
ALTENHOFEN. Previous hysterectomy also noted.  A simple appearing
cyst is seen in the right adnexa measuring 2.7 x 2.8 cm, which is
likely ovarian in origin.  No other pelvic masses are identified.

There is no evidence of renal calculi or hydronephrosis.  There is
no evidence of ureteral calculi or dilatation.  No bladder calculi
identified.

The other abdominal parenchymal organs have a normal appearance on
this noncontrast study.  Prior cholecystectomy noted.  No evidence
of inflammatory process or abnormal fluid collections.  No evidence
of bowel wall thickening or dilatation.
IMPRESSION: 1.  2.8 cm right adnexal cyst, which is likely ovarian in origin.
Pelvic ultrasound is recommended for further characterization in a
postmenopausal female.
2.  No other significant abnormality identified.

## 2011-05-05 ENCOUNTER — Other Ambulatory Visit: Payer: Self-pay | Admitting: Internal Medicine

## 2011-05-12 ENCOUNTER — Telehealth: Payer: Self-pay | Admitting: Internal Medicine

## 2011-05-12 DIAGNOSIS — R109 Unspecified abdominal pain: Secondary | ICD-10-CM

## 2011-05-12 NOTE — Telephone Encounter (Signed)
Spoke with patient and she states she is having left side abdominal pain like she has had in the past but she feels it is getting worse. States she has increased pain when she eats solids and prior to going to the bathroom. States that what she eats today does not come through until tomorrow and it used to come through one the same day. States if she does not eat much solid food she does not have much pain. Denies nausea and vomiting. She wants to know if she should see Dr. Olevia Perches or her surgeon. Please, advise. Hx ulcerative colitis. S/P colectomy, ileoproctostomy 1998, pouchitis, iron def. Anemia.

## 2011-05-13 MED ORDER — DOXYCYCLINE HYCLATE 100 MG PO TABS: ORAL_TABLET | ORAL | Status: DC

## 2011-05-13 NOTE — Telephone Encounter (Signed)
Scheduled SBFT at [facility] radiology on 05/14/11 10:15 arrival. NPO after midnight. Rx sent to pharmacy. Patient notified.

## 2011-05-13 NOTE — Telephone Encounter (Signed)
Based on her CT scan of the abd. From1/2012, she had an abnormal loop of the small bowl in LLQ with some inflammatory changes. Please obtain a SBFT to assess for partial obstruction. Also                                                                                                                                                                                                                                                                                                                                                          Start Doxycycline 100mg  po qd x 10 days, #10

## 2011-05-14 ENCOUNTER — Ambulatory Visit (HOSPITAL_COMMUNITY)
Admission: RE | Admit: 2011-05-14 | Discharge: 2011-05-14 | Disposition: A | Discharge status: Home / Self Care | Payer: Medicare Other | Source: Ambulatory Visit | Attending: Internal Medicine | Admitting: Internal Medicine

## 2011-05-14 DIAGNOSIS — R109 Unspecified abdominal pain: Secondary | ICD-10-CM

## 2011-05-14 DIAGNOSIS — K56609 Unspecified intestinal obstruction, unspecified as to partial versus complete obstruction: Secondary | ICD-10-CM | POA: Insufficient documentation

## 2011-05-14 IMAGING — CR DG SMALL BOWEL
1 series · 1 of 1 positions shown · non-contrast
Comparison: [DATE]

CLINICAL DATA: Prior colectomy with small bowel an anastomosis
with the rectum.  Abdominal pain.

SMALL BOWEL SERIES
TECHNIQUE: Following ingestion of a mixture of thin barium and
Entero Vu, serial small bowel images were obtained including spot
views of the terminal ileum.
Fluoroscopy time:  2.1 minutes.

[t abdomen supine]
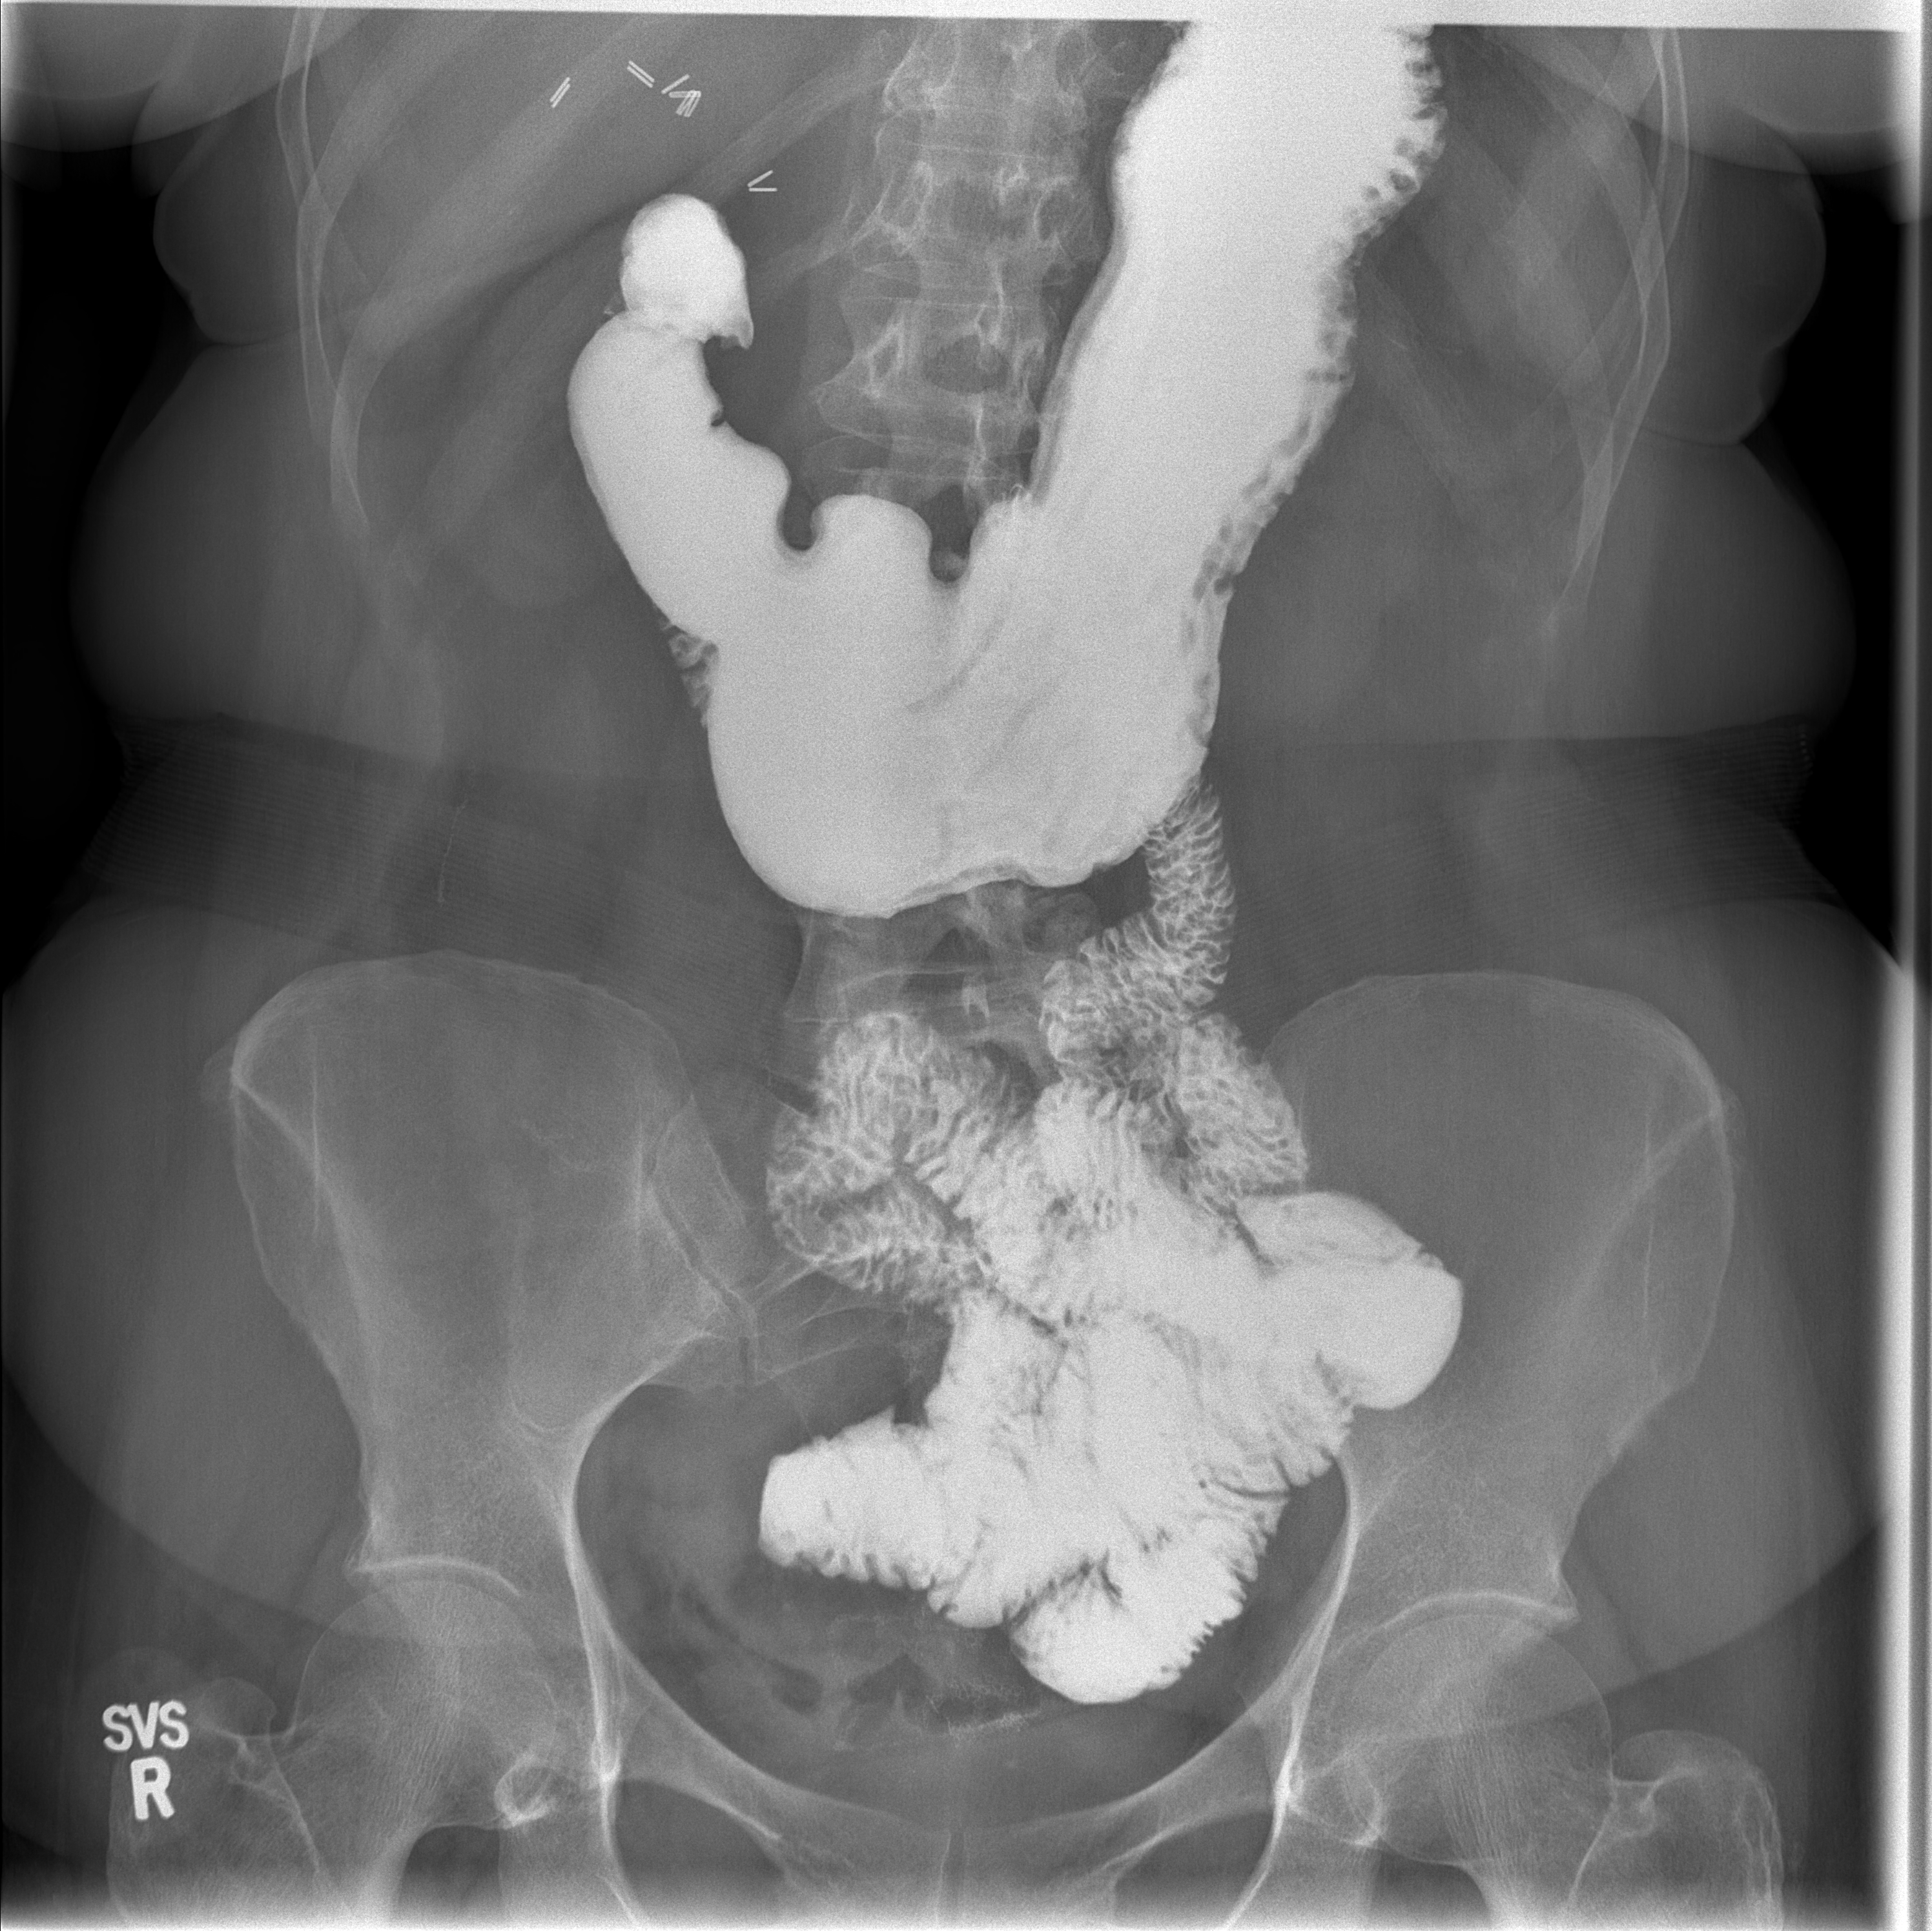

[1 of 1 positions shown; findings below may reference images not displayed]

FINDINGS: SMALL BOWEL FOLLOW-THROUGH:  The patient was given barium orally
and images of the small bowel were obtained.  The anastomosis of
the small bowel and rectum is visualize and there is a focal
segment of  narrowing of the distal ileum as it anastomoses with
the rectum.
The anastomosis is not complete occluded as contrast was visualized
passing through the stenotic anastomotic site.
IMPRESSION: 1.  There is a focal segment of stenosis at the enterocolonic
anastomoses which may account for the patient's symptoms. This area
is difficult to visualize in its entirety due to contrast
opacification of overlapping small bowel loops. If a more detailed
assessment of the anastomose  would be helpful then I would suggest
a retrograde study with contrast enema.

These findings were discussed with the patient at the time of the
examination was performed.

## 2011-05-14 IMAGING — CR DG SMALL BOWEL
1 series · 1 of 1 positions shown · non-contrast
Comparison: [DATE]

CLINICAL DATA: Prior colectomy with small bowel an anastomosis
with the rectum.  Abdominal pain.

SMALL BOWEL SERIES
TECHNIQUE: Following ingestion of a mixture of thin barium and
Entero Vu, serial small bowel images were obtained including spot
views of the terminal ileum.
Fluoroscopy time:  2.1 minutes.

[t abdomen supine]
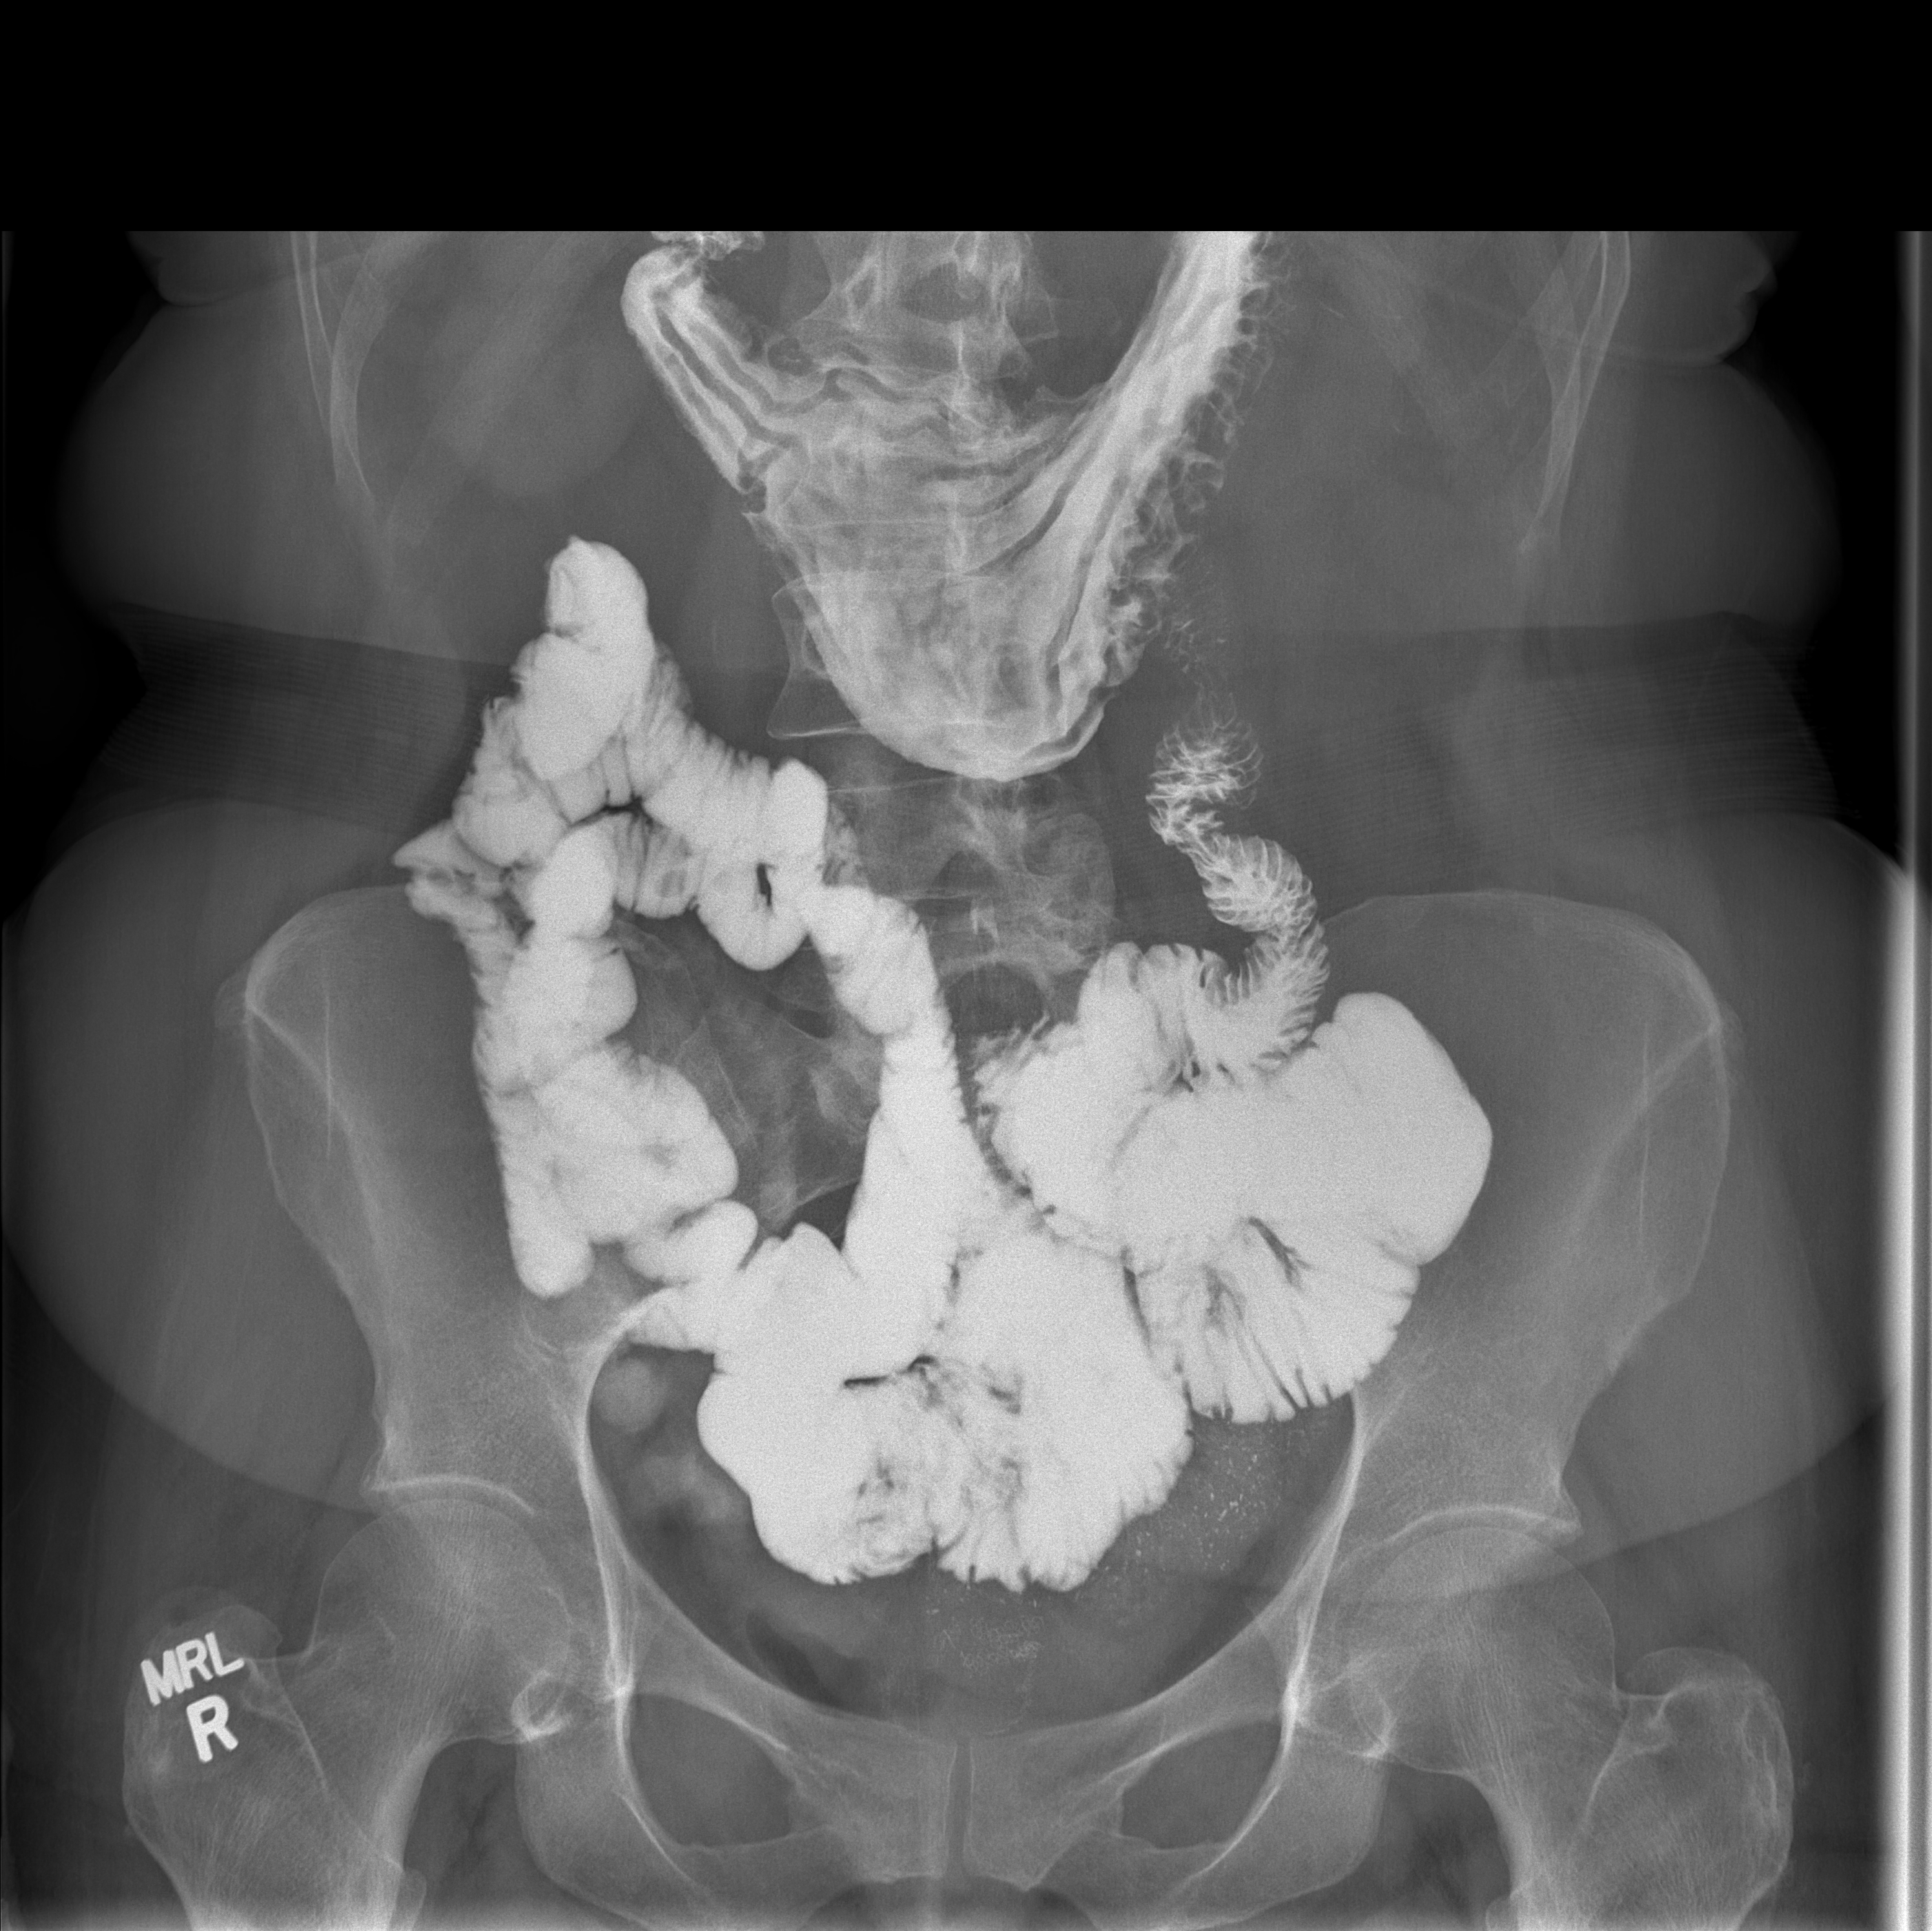

[1 of 1 positions shown; findings below may reference images not displayed]

FINDINGS: SMALL BOWEL FOLLOW-THROUGH:  The patient was given barium orally
and images of the small bowel were obtained.  The anastomosis of
the small bowel and rectum is visualize and there is a focal
segment of  narrowing of the distal ileum as it anastomoses with
the rectum.
The anastomosis is not complete occluded as contrast was visualized
passing through the stenotic anastomotic site.
IMPRESSION: 1.  There is a focal segment of stenosis at the enterocolonic
anastomoses which may account for the patient's symptoms. This area
is difficult to visualize in its entirety due to contrast
opacification of overlapping small bowel loops. If a more detailed
assessment of the anastomose  would be helpful then I would suggest
a retrograde study with contrast enema.

These findings were discussed with the patient at the time of the
examination was performed.

## 2011-05-14 IMAGING — RF DG SMALL BOWEL
11 series · 11 of 11 positions shown · non-contrast
Comparison: [DATE]

CLINICAL DATA: Prior colectomy with small bowel an anastomosis
with the rectum.  Abdominal pain.

SMALL BOWEL SERIES
TECHNIQUE: Following ingestion of a mixture of thin barium and
Entero Vu, serial small bowel images were obtained including spot
views of the terminal ileum.
Fluoroscopy time:  2.1 minutes.

[Series 1: run · 1 of 1 slices shown (1 of 11)]
[im 1/1]
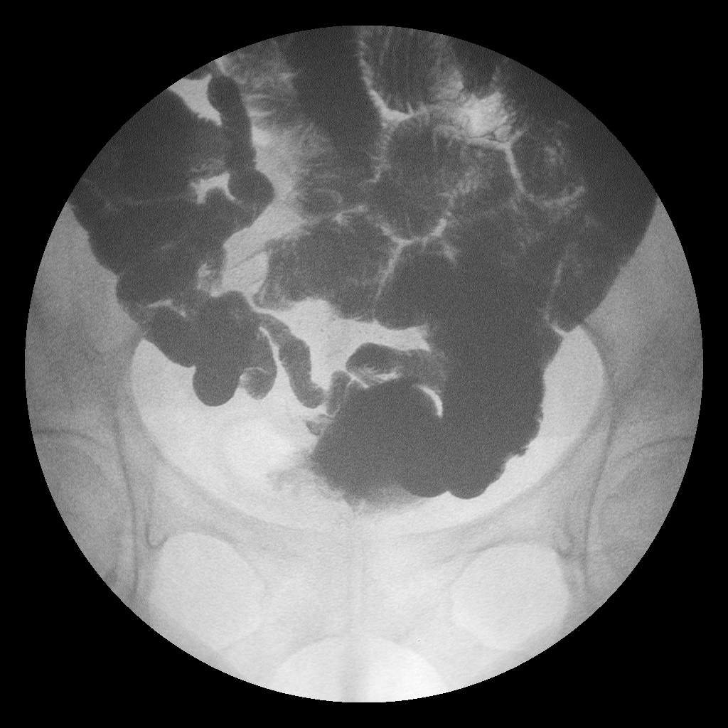

[Series 2: run · 1 of 1 slices shown (2 of 11)]
[im 1/1]
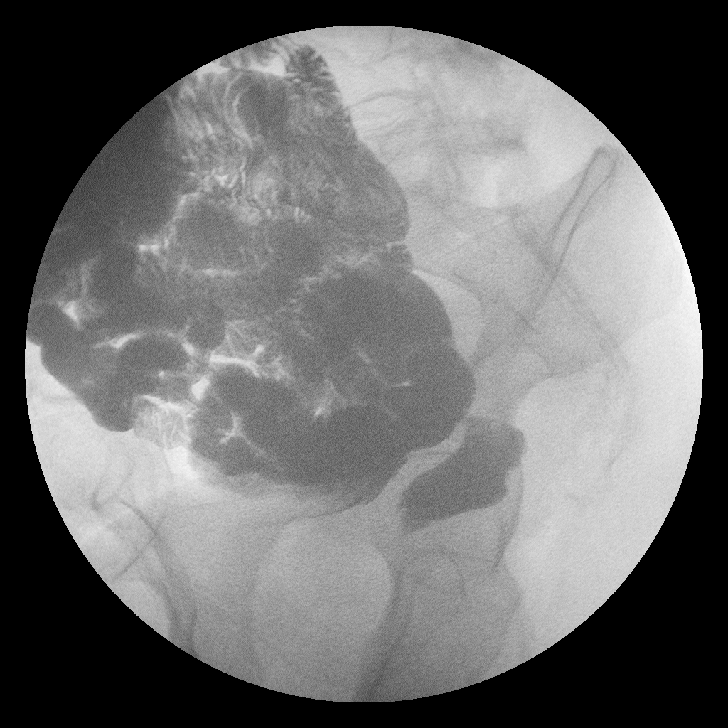

[Series 3: run · 1 of 1 slices shown (3 of 11)]
[im 1/1]
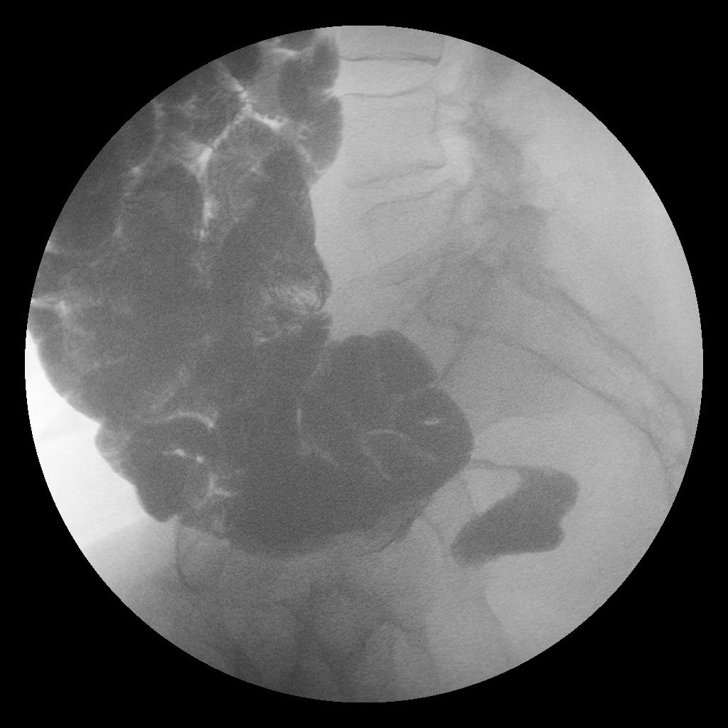

[Series 4: run · 1 of 1 slices shown (4 of 11)]
[im 1/1]
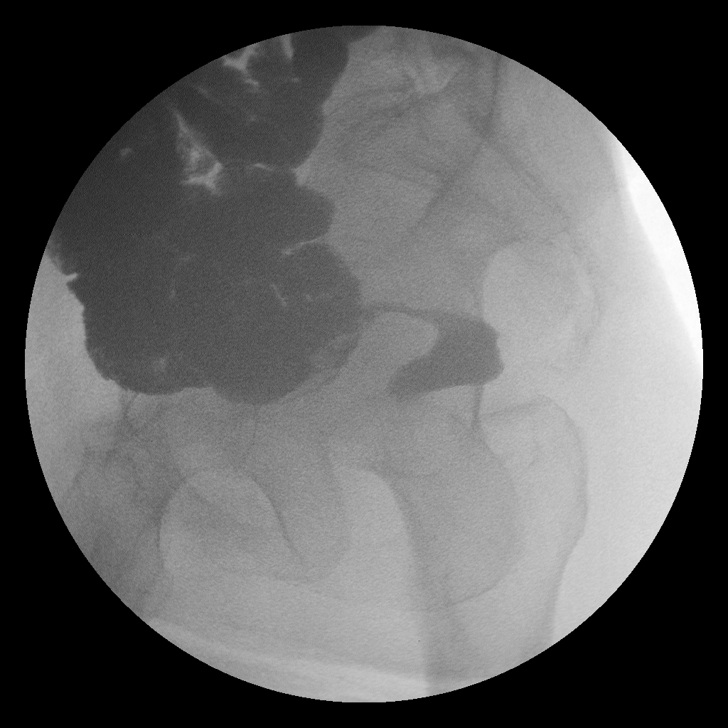

[Series 5: run · 1 of 1 slices shown (5 of 11)]
[im 1/1]
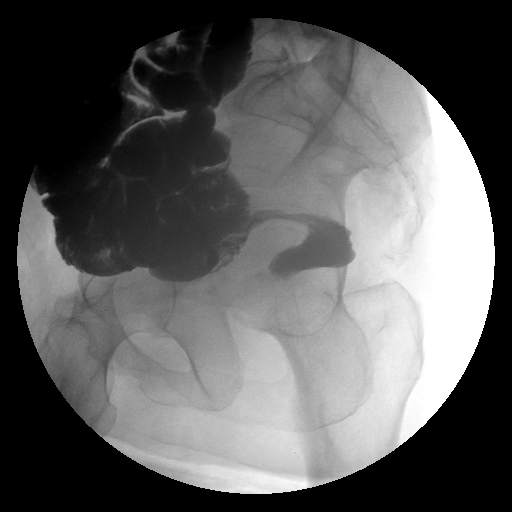

[Series 6: run · 1 of 1 slices shown (6 of 11)]
[im 1/1]
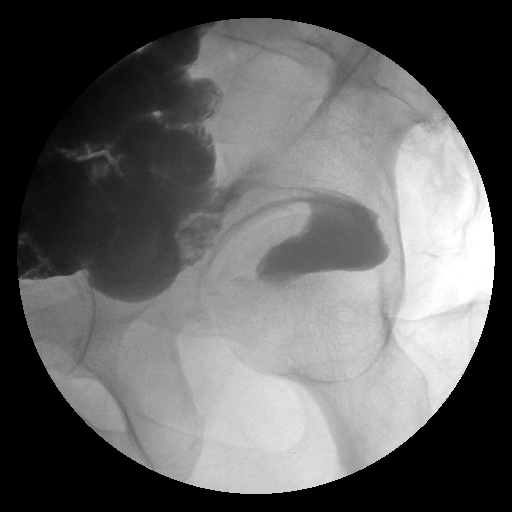

[Series 7: run · 1 of 1 slices shown (7 of 11)]
[im 1/1]
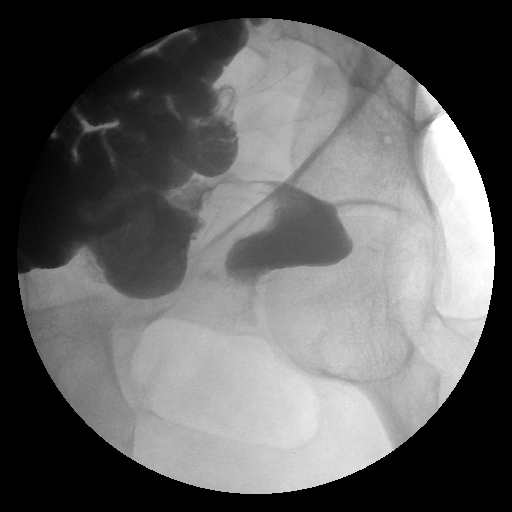

[Series 8: run · 1 of 1 slices shown (8 of 11)]
[im 1/1]
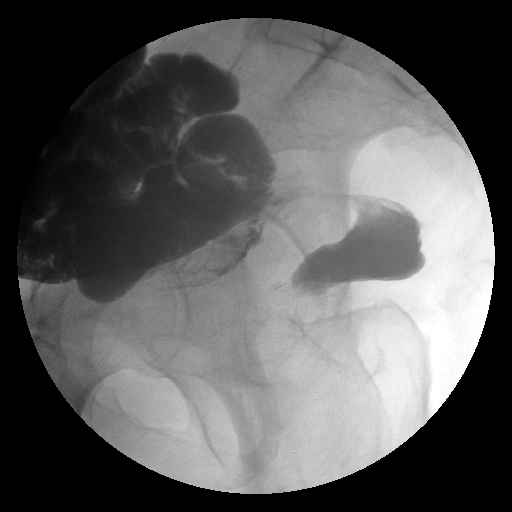

[Series 9: run · 1 of 1 slices shown (9 of 11)]
[im 1/1]
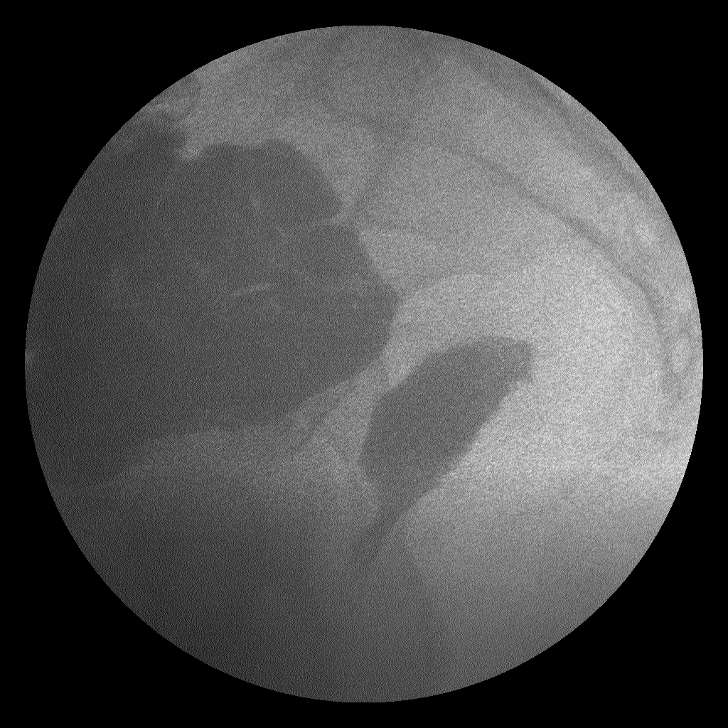

[Series 10: run · 1 of 1 slices shown (10 of 11)]
[im 1/1]
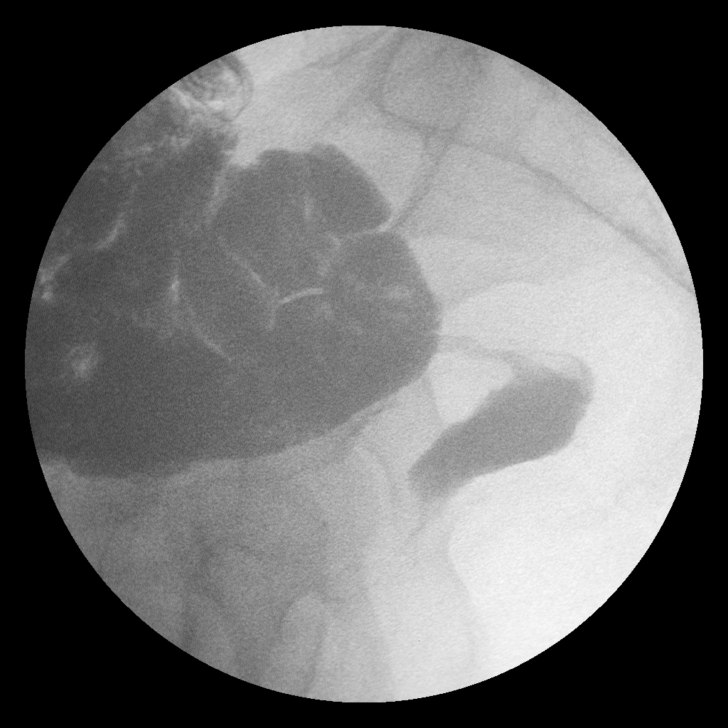

[Series 11: run · 1 of 1 slices shown (11 of 11)]
[im 1/1]
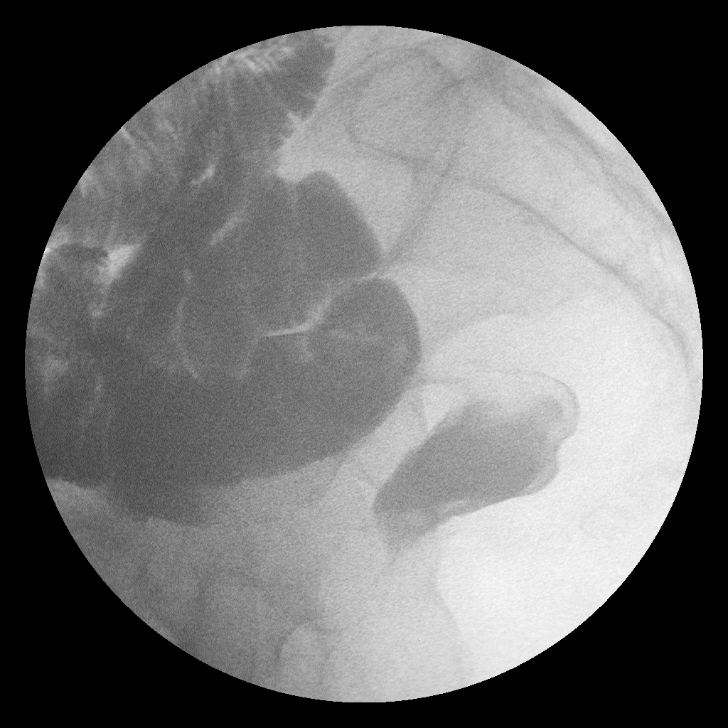

[11 of 11 positions shown; findings below may reference images not displayed]

FINDINGS: SMALL BOWEL FOLLOW-THROUGH:  The patient was given barium orally
and images of the small bowel were obtained.  The anastomosis of
the small bowel and rectum is visualize and there is a focal
segment of  narrowing of the distal ileum as it anastomoses with
the rectum.
The anastomosis is not complete occluded as contrast was visualized
passing through the stenotic anastomotic site.
IMPRESSION: 1.  There is a focal segment of stenosis at the enterocolonic
anastomoses which may account for the patient's symptoms. This area
is difficult to visualize in its entirety due to contrast
opacification of overlapping small bowel loops. If a more detailed
assessment of the anastomose  would be helpful then I would suggest
a retrograde study with contrast enema.

These findings were discussed with the patient at the time of the
examination was performed.

## 2011-05-14 IMAGING — CR DG SMALL BOWEL
2 series · 2 of 2 positions shown · non-contrast
Comparison: [DATE]

CLINICAL DATA: Prior colectomy with small bowel an anastomosis
with the rectum.  Abdominal pain.

SMALL BOWEL SERIES
TECHNIQUE: Following ingestion of a mixture of thin barium and
Entero Vu, serial small bowel images were obtained including spot
views of the terminal ileum.
Fluoroscopy time:  2.1 minutes.

[view not recorded (1 of 2)]
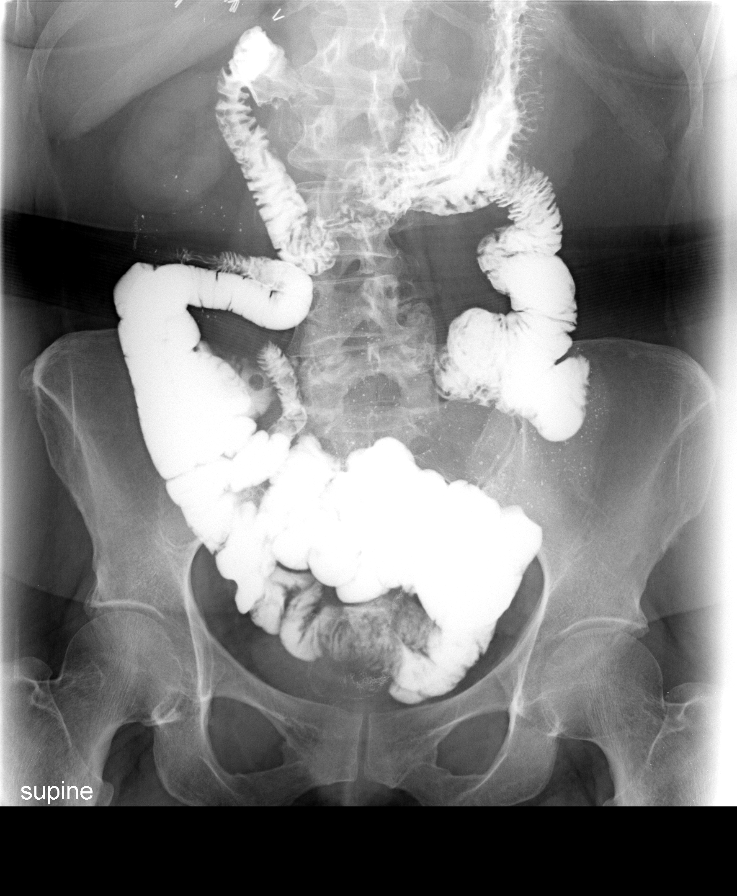

[view not recorded (2 of 2)]
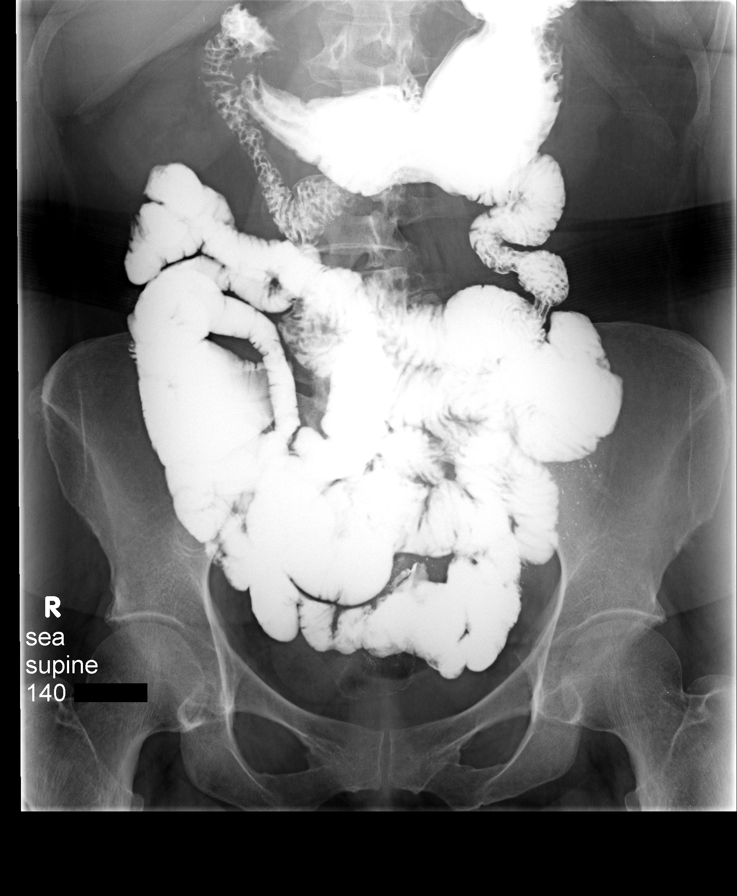

[2 of 2 positions shown; findings below may reference images not displayed]

FINDINGS: SMALL BOWEL FOLLOW-THROUGH:  The patient was given barium orally
and images of the small bowel were obtained.  The anastomosis of
the small bowel and rectum is visualize and there is a focal
segment of  narrowing of the distal ileum as it anastomoses with
the rectum.
The anastomosis is not complete occluded as contrast was visualized
passing through the stenotic anastomotic site.
IMPRESSION: 1.  There is a focal segment of stenosis at the enterocolonic
anastomoses which may account for the patient's symptoms. This area
is difficult to visualize in its entirety due to contrast
opacification of overlapping small bowel loops. If a more detailed
assessment of the anastomose  would be helpful then I would suggest
a retrograde study with contrast enema.

These findings were discussed with the patient at the time of the
examination was performed.

## 2011-05-14 IMAGING — CR DG SMALL BOWEL
2 series · 2 of 2 positions shown · non-contrast
Comparison: [DATE]

CLINICAL DATA: Prior colectomy with small bowel an anastomosis
with the rectum.  Abdominal pain.

SMALL BOWEL SERIES
TECHNIQUE: Following ingestion of a mixture of thin barium and
Entero Vu, serial small bowel images were obtained including spot
views of the terminal ileum.
Fluoroscopy time:  2.1 minutes.

[t abdomen supine]
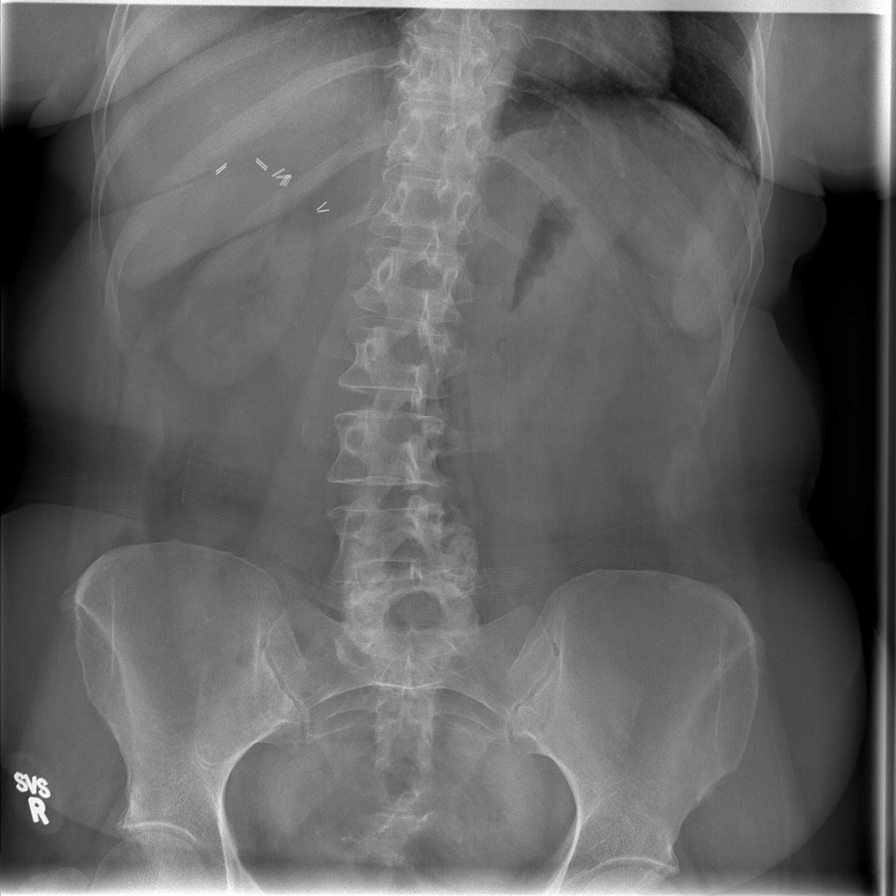

[t abdomen supine *]
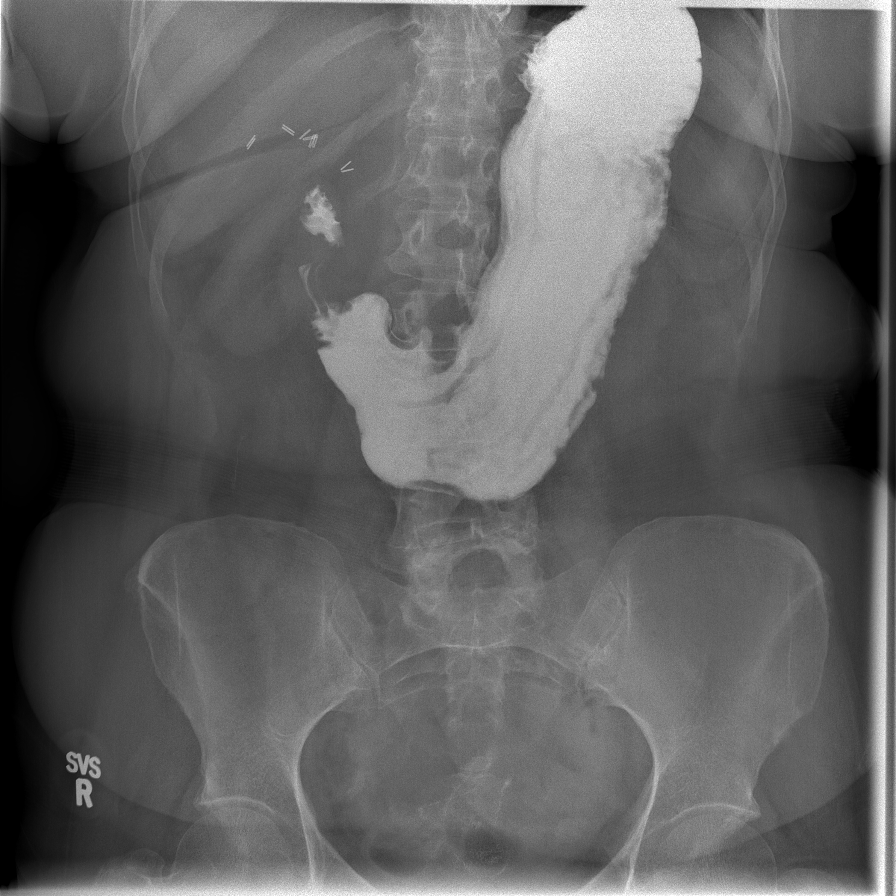

[2 of 2 positions shown; findings below may reference images not displayed]

FINDINGS: SMALL BOWEL FOLLOW-THROUGH:  The patient was given barium orally
and images of the small bowel were obtained.  The anastomosis of
the small bowel and rectum is visualize and there is a focal
segment of  narrowing of the distal ileum as it anastomoses with
the rectum.
The anastomosis is not complete occluded as contrast was visualized
passing through the stenotic anastomotic site.
IMPRESSION: 1.  There is a focal segment of stenosis at the enterocolonic
anastomoses which may account for the patient's symptoms. This area
is difficult to visualize in its entirety due to contrast
opacification of overlapping small bowel loops. If a more detailed
assessment of the anastomose  would be helpful then I would suggest
a retrograde study with contrast enema.

These findings were discussed with the patient at the time of the
examination was performed.

## 2011-05-14 IMAGING — CR DG SMALL BOWEL
1 series · 1 of 1 positions shown · non-contrast
Comparison: [DATE]

CLINICAL DATA: Prior colectomy with small bowel an anastomosis
with the rectum.  Abdominal pain.

SMALL BOWEL SERIES
TECHNIQUE: Following ingestion of a mixture of thin barium and
Entero Vu, serial small bowel images were obtained including spot
views of the terminal ileum.
Fluoroscopy time:  2.1 minutes.

[t abdomen supine *]
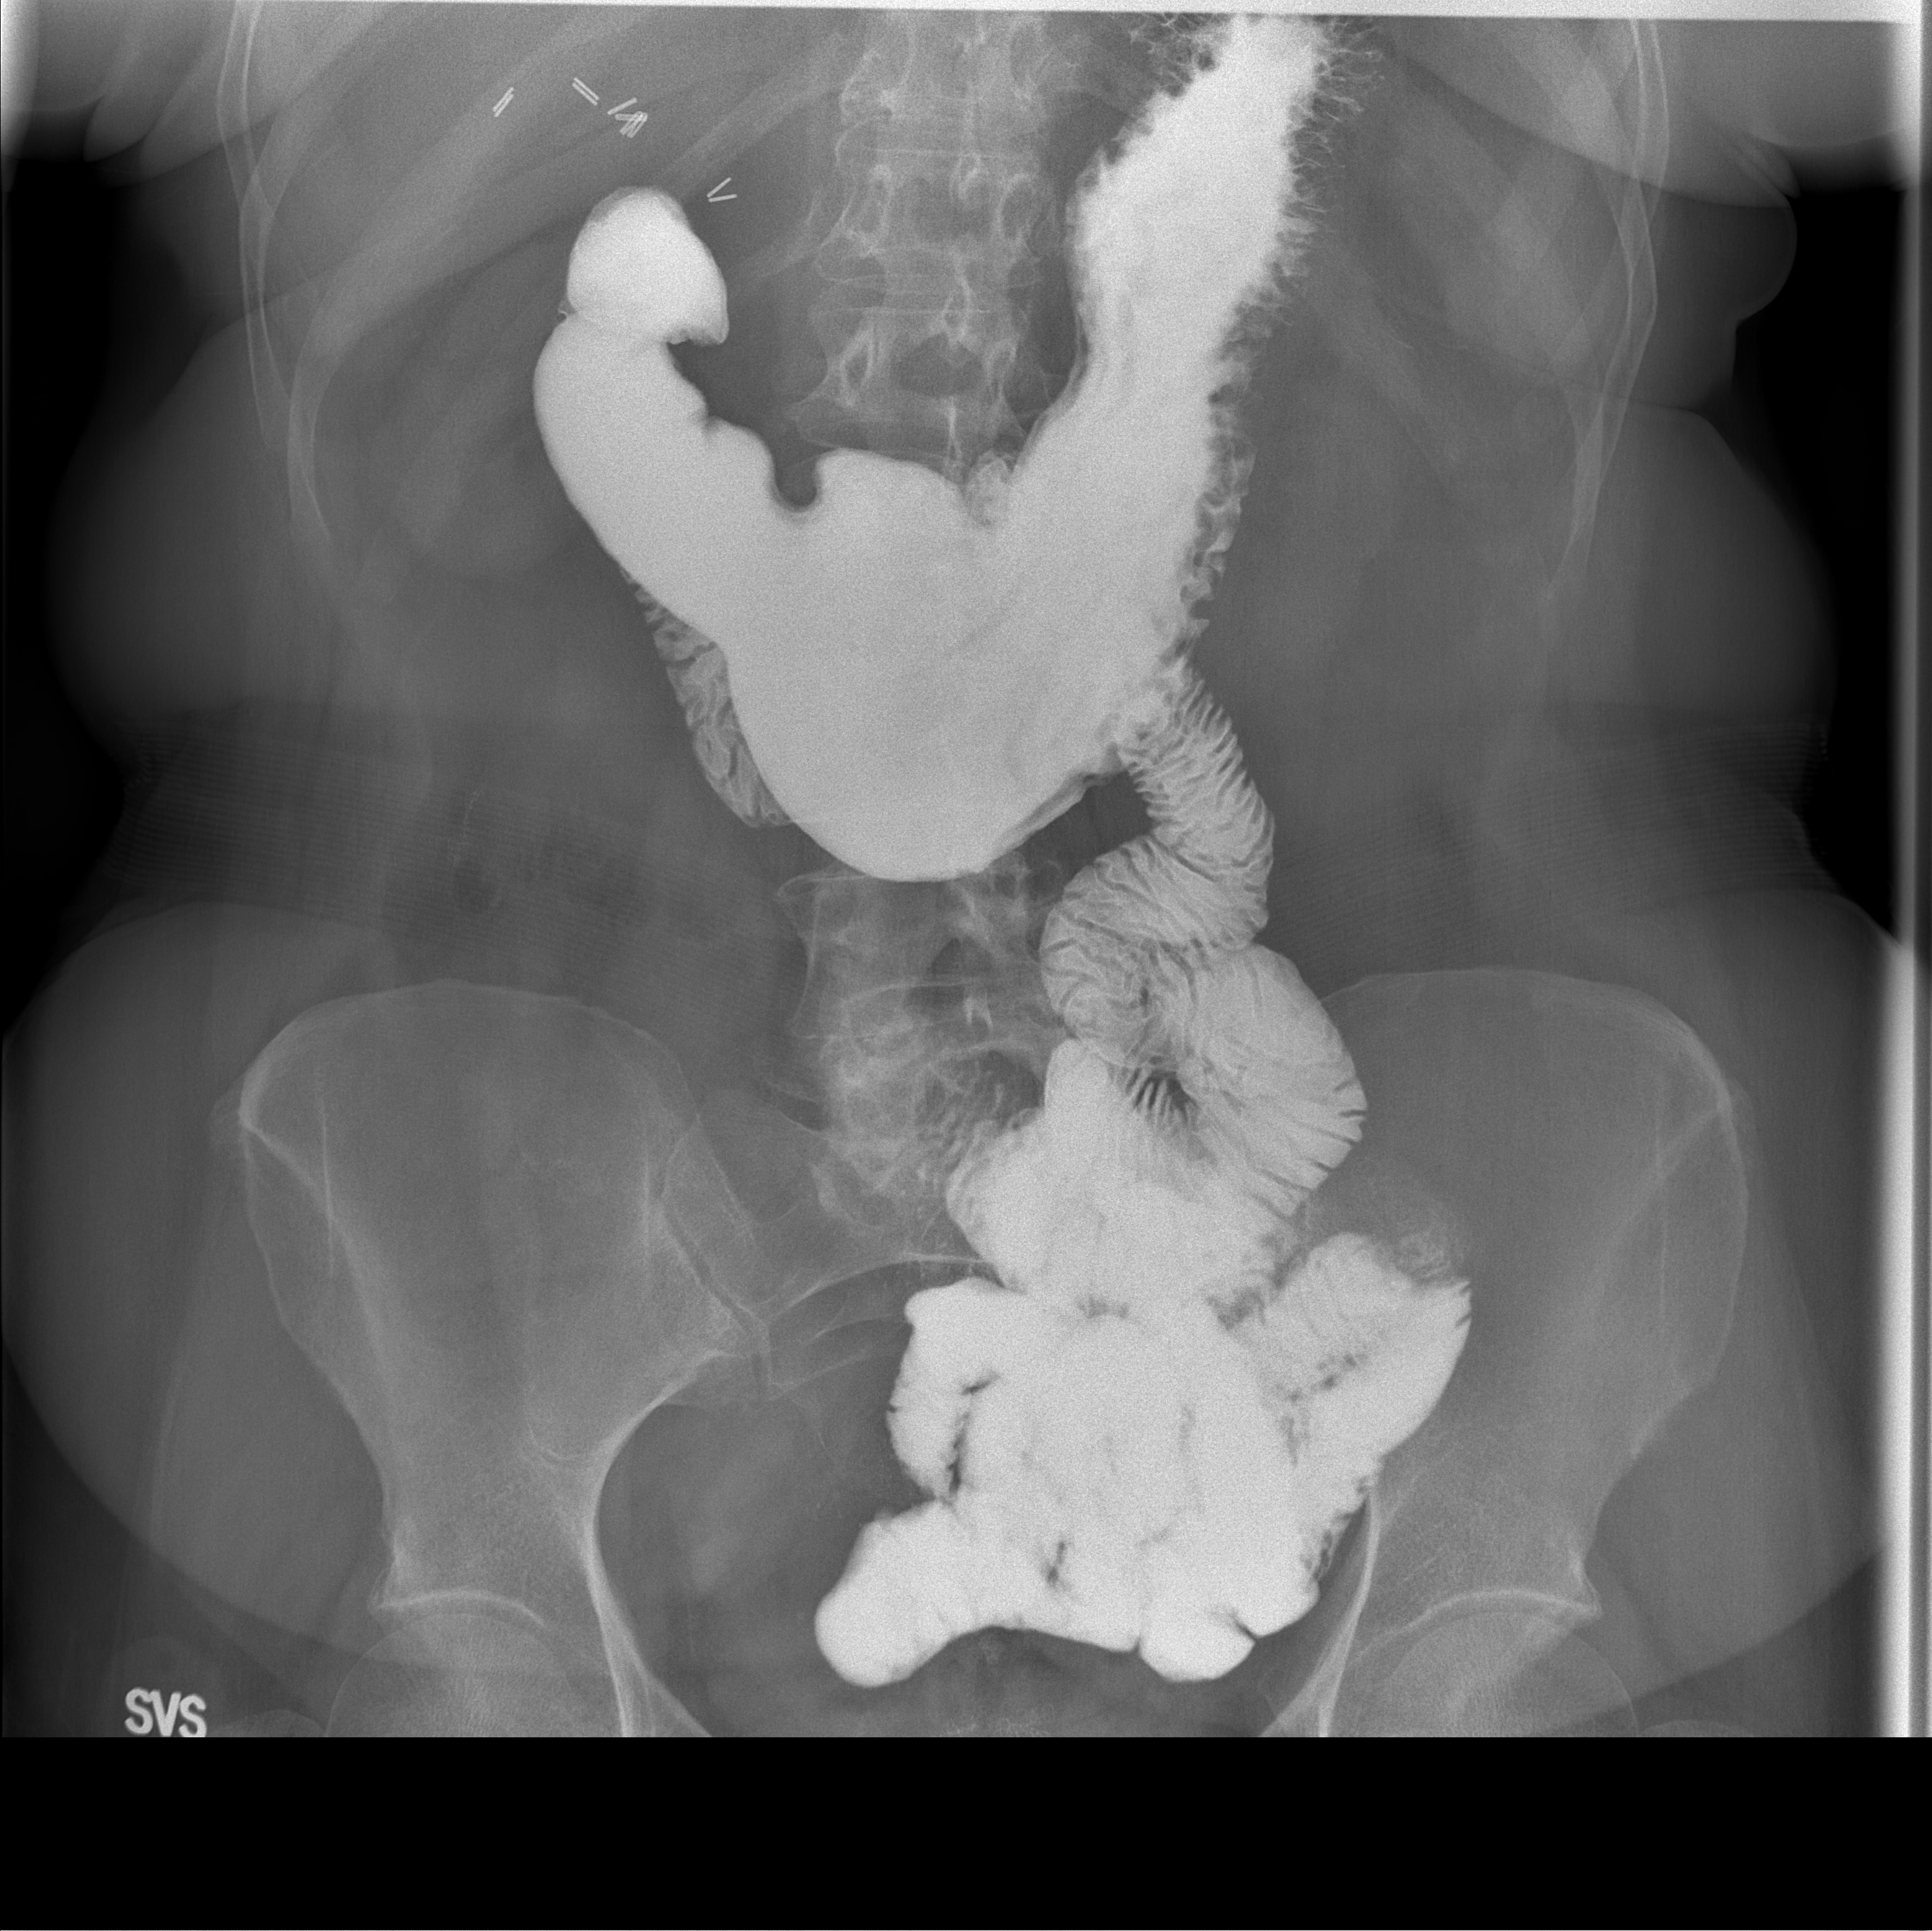

[1 of 1 positions shown; findings below may reference images not displayed]

FINDINGS: SMALL BOWEL FOLLOW-THROUGH:  The patient was given barium orally
and images of the small bowel were obtained.  The anastomosis of
the small bowel and rectum is visualize and there is a focal
segment of  narrowing of the distal ileum as it anastomoses with
the rectum.
The anastomosis is not complete occluded as contrast was visualized
passing through the stenotic anastomotic site.
IMPRESSION: 1.  There is a focal segment of stenosis at the enterocolonic
anastomoses which may account for the patient's symptoms. This area
is difficult to visualize in its entirety due to contrast
opacification of overlapping small bowel loops. If a more detailed
assessment of the anastomose  would be helpful then I would suggest
a retrograde study with contrast enema.

These findings were discussed with the patient at the time of the
examination was performed.

## 2011-05-17 ENCOUNTER — Telehealth: Payer: Self-pay | Admitting: *Deleted

## 2011-05-17 NOTE — Telephone Encounter (Signed)
Message copied by Hulan Saas on Mon May 17, 2011  9:59 AM ------      Message from: [place] Dragon      Created: Fri May 14, 2011 10:34 PM       Please call pt with the result of the SBFT which shows an area of narrowing at the site of the prior surgery. This area may be a site of partial obstruction and cause of pain. Last flex sigmoidoscopy in 2009 showed this area to be opened but it might have closed up due to ongoing inflammation. Please ask pt to schedule flex sigm with inspection of this narrow area and possible dilation.If she'd rather talk to me about it first , please schedule OV.

## 2011-05-17 NOTE — Telephone Encounter (Signed)
Spoke with patient and gave her the results and Dr. Nichola Sizer recommendation. Scheduled patient for Flex sig on 05/27/11 with arrival at 3:00 PM for 4:00 PM procedure. Pre visit on 05/21/11 at 8:30 AM

## 2011-05-17 NOTE — Telephone Encounter (Signed)
Left a message for patient to call me. 

## 2011-05-21 ENCOUNTER — Encounter: Payer: Self-pay | Admitting: Gynecology

## 2011-05-21 ENCOUNTER — Ambulatory Visit (INDEPENDENT_AMBULATORY_CARE_PROVIDER_SITE_OTHER): Payer: Medicare Other | Admitting: Gynecology

## 2011-05-21 ENCOUNTER — Telehealth: Payer: Self-pay | Admitting: *Deleted

## 2011-05-21 ENCOUNTER — Other Ambulatory Visit (HOSPITAL_COMMUNITY)
Admission: RE | Admit: 2011-05-21 | Discharge: 2011-05-21 | Disposition: A | Discharge status: Home / Self Care | Payer: Medicare Other | Source: Ambulatory Visit | Attending: Gynecology | Admitting: Gynecology

## 2011-05-21 VITALS — BP 118/74 | Ht 63.0 in | Wt 158.0 lb

## 2011-05-21 DIAGNOSIS — N83209 Unspecified ovarian cyst, unspecified side: Secondary | ICD-10-CM

## 2011-05-21 DIAGNOSIS — N83201 Unspecified ovarian cyst, right side: Secondary | ICD-10-CM

## 2011-05-21 DIAGNOSIS — Z01419 Encounter for gynecological examination (general) (routine) without abnormal findings: Secondary | ICD-10-CM | POA: Insufficient documentation

## 2011-05-21 NOTE — Telephone Encounter (Signed)
No show for previsit.  Sent nos letter and cx'd colon  For 05/27/11

## 2011-05-21 NOTE — Progress Notes (Signed)
[name] [birth year] [id number]   History:    55 y.o.  for annual exam who brought up to my attention that she was recently evaluated by the urologist because of low back pain and gross hematuria and appears she had a spontaneous passage of a kidney stone at the time of the CT scan a right 2.8 cm right ovarian cyst was noted. She was here also for her annual gynecological examination. She has had a history of bowel resection in the past for ulcerated colitis. She is also been followed by Dr. Maurene Capes gastroenterologist for which she has a followup appointment later this month her colonoscopy. She has a history of TAH/LSO in 1998. She's been followed at [facility] by Dr Marval Regal. She is also on metoprolol placed by her cardiologist because of tachycardia. Her last bone density study was over 3 years ago. She is taken her calcium and vitamin D. She has had several years ago history of cervical dysplasia at another facility whereby she had a LEEP cervical conization. Followup Pap smears have been normal. Her mammogram was over year ago and she in frequently does her self breast examinations.  Past medical history,surgical history, family history and social history were all reviewed and documented in the EPIC chart.  ROS:  Was performed and pertinent positives and negatives are included in the history.  Exam: chaperone present BP 118/74  Ht 5' 3"  (1.6 m)  Wt 158 lb (71.668 kg)  BMI 27.99 kg/m2  Body mass index is 27.99 kg/(m^2).  General appearance : Well developed well nourished female. No acute distress HEENT: Neck supple, trachea midline, no carotid bruits, no thyroidmegaly Lungs: Clear to auscultation, no rhonchi or wheezes, or rib retractions  Heart: Regular rate and rhythm, no murmurs or gallops Breast:Examined in sitting and supine position were symmetrical in appearance, no palpable masses or tenderness,  no skin retraction, no nipple inversion, no nipple discharge, no  skin discoloration, no axillary or supraclavicular lymphadenopathy Abdomen: no palpable masses or tenderness, no rebound or guarding Extremities: no edema or skin discoloration or tenderness  Pelvic:  Bartholin, Urethra, Skene Glands: Within normal limits             Vagina: No gross lesions or discharge  Cervix: Absent  Uterus  absent  Adnexa  Without masses or tenderness  Anus and perineum declined exam             Hemoccult not done     Assessment/Plan:  55 y.o. female for annual exam with incidental finding on CT scan for recent nephrolithiasis which had demonstrated a right 2.8 cm ovarian cyst. Patient with no complaints. She will followup next week for an ultrasound here in the office for better assessment. She will followup with Dr. Maurene Capes for her colonoscopy which is scheduled in a few weeks. She is encouraged to do her monthly self breast examinations. Since her primary physician as of recently done all her lab work no additional lab will be drawn today. Requisition for her to schedule mammogram was provided as well. We'll see her next week to discuss results of the ultrasound and plan a course of management. Next year will get a bone density study.    Terrance Mass MD, 11:31 AM 05/21/2011

## 2011-05-22 ENCOUNTER — Other Ambulatory Visit: Payer: Self-pay | Admitting: Internal Medicine

## 2011-05-24 ENCOUNTER — Encounter: Payer: Self-pay | Admitting: Internal Medicine

## 2011-05-25 ENCOUNTER — Ambulatory Visit (AMBULATORY_SURGERY_CENTER): Payer: Medicare Other

## 2011-05-25 ENCOUNTER — Encounter: Payer: Self-pay | Admitting: Internal Medicine

## 2011-05-25 VITALS — Ht 63.0 in | Wt 156.5 lb

## 2011-05-25 DIAGNOSIS — R109 Unspecified abdominal pain: Secondary | ICD-10-CM

## 2011-05-25 DIAGNOSIS — K5100 Ulcerative (chronic) pancolitis without complications: Secondary | ICD-10-CM

## 2011-05-27 ENCOUNTER — Other Ambulatory Visit: Payer: Medicare Other | Admitting: Internal Medicine

## 2011-06-01 LAB — URINALYSIS, ROUTINE W REFLEX MICROSCOPIC
Bilirubin Urine: NEGATIVE
Glucose, UA: NEGATIVE
Hgb urine dipstick: NEGATIVE
Ketones, ur: NEGATIVE
Nitrite: NEGATIVE
Protein, ur: NEGATIVE
Specific Gravity, Urine: 1.013
Urobilinogen, UA: 0.2
pH: 6.5

## 2011-06-01 LAB — BASIC METABOLIC PANEL
BUN: 13
BUN: 20
CO2: 24
CO2: 24
Calcium: 8.6
Calcium: 8.7
Chloride: 102
Chloride: 95 — ABNORMAL LOW
Creatinine, Ser: 1.6 — ABNORMAL HIGH
Creatinine, Ser: 1.84 — ABNORMAL HIGH
GFR calc Af Amer: 35 — ABNORMAL LOW
GFR calc Af Amer: 41 — ABNORMAL LOW
GFR calc non Af Amer: 29 — ABNORMAL LOW
GFR calc non Af Amer: 34 — ABNORMAL LOW
Glucose, Bld: 102 — ABNORMAL HIGH
Glucose, Bld: 90
Potassium: 3.1 — ABNORMAL LOW
Potassium: 3.9
Sodium: 130 — ABNORMAL LOW
Sodium: 134 — ABNORMAL LOW

## 2011-06-01 LAB — COMPREHENSIVE METABOLIC PANEL
ALT: 25
AST: 31
Albumin: 4.1
Alkaline Phosphatase: 66
BUN: 22
CO2: 26
Calcium: 9.7
Chloride: 88 — ABNORMAL LOW
Creatinine, Ser: 2.26 — ABNORMAL HIGH
GFR calc Af Amer: 27 — ABNORMAL LOW
GFR calc non Af Amer: 23 — ABNORMAL LOW
Glucose, Bld: 111 — ABNORMAL HIGH
Potassium: 3.2 — ABNORMAL LOW
Sodium: 126 — ABNORMAL LOW
Total Bilirubin: 0.8
Total Protein: 8

## 2011-06-01 LAB — CBC
HCT: 29 — ABNORMAL LOW
HCT: 29.2 — ABNORMAL LOW
HCT: 33.2 — ABNORMAL LOW
Hemoglobin: 10.2 — ABNORMAL LOW
Hemoglobin: 10.2 — ABNORMAL LOW
Hemoglobin: 11.6 — ABNORMAL LOW
MCHC: 34.8
MCHC: 34.9
MCHC: 35.3
MCV: 91.3
MCV: 92.1
MCV: 93.2
Platelets: 355
Platelets: 360
Platelets: 509 — ABNORMAL HIGH
RBC: 3.13 — ABNORMAL LOW
RBC: 3.15 — ABNORMAL LOW
RBC: 3.64 — ABNORMAL LOW
RDW: 13
RDW: 13.2
RDW: 13.5
WBC: 15 — ABNORMAL HIGH
WBC: 7.7
WBC: 8.8

## 2011-06-01 LAB — DIFFERENTIAL
Basophils Absolute: 0.1
Basophils Relative: 1
Eosinophils Absolute: 0.1
Eosinophils Relative: 1
Lymphocytes Relative: 20
Lymphs Abs: 3
Monocytes Absolute: 0.9
Monocytes Relative: 6
Neutro Abs: 11 — ABNORMAL HIGH
Neutrophils Relative %: 73

## 2011-06-01 LAB — URINE CULTURE: Colony Count: 40000

## 2011-06-01 LAB — URINE MICROSCOPIC-ADD ON

## 2011-06-01 LAB — POCT PREGNANCY, URINE
Operator id: 284251
Preg Test, Ur: NEGATIVE

## 2011-06-03 ENCOUNTER — Ambulatory Visit (AMBULATORY_SURGERY_CENTER): Payer: Medicare Other | Admitting: Internal Medicine

## 2011-06-03 ENCOUNTER — Ambulatory Visit (HOSPITAL_COMMUNITY): Admission: RE | Admit: 2011-06-03 | Payer: Medicare Other | Source: Ambulatory Visit | Admitting: Internal Medicine

## 2011-06-03 ENCOUNTER — Encounter: Payer: Self-pay | Admitting: Internal Medicine

## 2011-06-03 VITALS — BP 114/69 | HR 89 | Temp 98.2°F | Resp 18 | Ht 63.0 in | Wt 156.0 lb

## 2011-06-03 DIAGNOSIS — K624 Stenosis of anus and rectum: Secondary | ICD-10-CM

## 2011-06-03 DIAGNOSIS — K5190 Ulcerative colitis, unspecified, without complications: Secondary | ICD-10-CM

## 2011-06-03 DIAGNOSIS — K5100 Ulcerative (chronic) pancolitis without complications: Secondary | ICD-10-CM

## 2011-06-03 DIAGNOSIS — R109 Unspecified abdominal pain: Secondary | ICD-10-CM

## 2011-06-03 DIAGNOSIS — K56609 Unspecified intestinal obstruction, unspecified as to partial versus complete obstruction: Secondary | ICD-10-CM

## 2011-06-03 DIAGNOSIS — K5120 Ulcerative (chronic) proctitis without complications: Secondary | ICD-10-CM

## 2011-06-03 LAB — HM COLONOSCOPY

## 2011-06-03 MED ORDER — SODIUM CHLORIDE 0.9 % IV SOLN: 500.0000 mL | INTRAVENOUS | Status: DC

## 2011-06-03 NOTE — Patient Instructions (Addendum)
Please refer to your blue and neon green sheets for instructions regarding diet and activity for the rest of today.  You may resume your medications as you would normally take them.  You will receive a letter in the mail in about two weeks regarding the results of the biopsies taken today.  Ulcerative Colitis Ulcerative colitis is a long lasting swelling and soreness (inflammation) of the colon (large intestine). In patients with ulcerative colitis, sores (ulcers) and inflammation of the inner lining of the colon lead to illness. Ulcerative colitis can also cause problems outside the digestive tract.  Ulcerative colitis is closely related to another condition of inflammation of the intestines called Crohn's disease. Together, they are frequently referred to as inflammatory bowel disease (IBD). Ulcerative colitis and Crohn's diseases are conditions that can last years to decades. Men and women are affected equally. They most commonly begin during adolescence and early adulthood.  SYMPTOMS  Common symptoms of ulcerative colitis include rectal bleeding and diarrhea. There is a wide range of symptoms among patients with this disease depending on how severe the disease is. Some of these symptoms are:  Abdominal pain or cramping.  Diarrhea.   Fever.   Tiredness (fatigue).   Weight loss.  Night sweats.   Rectal pain.   Feeling the immediate need to have a bowel movement (rectal urgency).   CAUSES  Ulcerative colitis is caused by increased activity of the immune system in the intestines. The immune system is the system that protects the body against disease such as harmful bacteria, viruses, fungi, and other foreign invaders. When the immune system overacts, it causes inflammation. The cause of the increased immune system activity is not known. This over activity causes long-lasting inflammation and ulceration. This condition may be passed down from ones parents (inherited). Brothers, sisters,  children, and parents of patients with IBD are more likely to develop these diseases. It is not contagious. This means you cannot catch it from someone else. DIAGNOSIS Your caregiver may suspect Crohn's disease based on your symptoms and exam. Blood tests may confirm that there is a problem. You may be asked to submit a stool specimen for examination. X-rays and CT scans may be necessary. Ultimately, the diagnosis is usually made after a flexible tube is inserted via your anus and your colon is examined under sedation (colonoscopy). With this test, the specialist can take a tiny tissue sample from inside the bowel (biopsy). Examination of this biopsy tissue under a microscopy can reveal Ulcerative Colitis as the cause of your symptoms. TREATMENT  There is no cure for ulcerative colitis.   Complications such as massive bleeding from the colon (hemorrhage), development of a hole in the colon (perforation), or the development of precancerous or cancerous changes of the colon may require surgery.   Medications are often used to decrease inflammation and control the immune system. These include medicines related to aspirin, steroid medications, and newer and stronger medications to slow down the immune system. Some medications may be used as suppositories or enemas. A number of other medications are used or have been studied. Your caregiver will make specific recommendations.  HOME CARE INSTRUCTIONS  There is no cure for ulcerative colitis disease. The best treatment is frequent checkups with your caregiver. Periodic reevaluation is important.   Symptoms such as diarrhea can be controlled with medications. Avoid foods that have a laxative effect such fresh fruit and vegetables and dairy products. During flare ups, you can rest your bowel by staying away from  solid foods. Drink clear liquids frequently during the day. Electrolyte or rehydrating fluids are best. Your caregiver can help you with suggestions.  Drink often to prevent dehydration. When diarrhea has cleared, eat smaller meals and more often. Avoid food additives and stimulants such as caffeine (coffee, tea, many sodas, or chocolate). Avoid dairy products. Enzyme supplements may help if you develop intolerance to a sugar in dairy products (lactose). Ask your caregiver or dietitian about specific dietary instructions.   If you had surgery, be sure you understand your care instructions thoroughly, including proper care of any surgical wounds.   Take any medications exactly as prescribed.   Try to maintain a positive attitude. Learn relaxation techniques such as self hypnosis, mental imaging, and muscle relaxation. If possible, avoid stresses that aggravate your condition. Exercise regularly. Follow your diet. Always get plenty of rest.  SEEK MEDICAL CARE IF:  Your symptoms fail to improve after a week or two of new treatment.   You experience continued weight loss.   You have ongoing crampy digestion or loose bowels.   You develop a new skin rash, skin sores, or eye problems.  SEEK IMMEDIATE MEDICAL CARE IF:  You have worsening of your symptoms or develop new symptoms.   You have an increased oral temperature, not controlled by medicine.   You develop bloody diarrhea.   You have severe abdominal pain.  Document Released: 06/02/2005 Document Re-Released: 09/14/2009 [facility] Patient Information 2011 [place].

## 2011-06-04 ENCOUNTER — Telehealth: Payer: Self-pay | Admitting: *Deleted

## 2011-06-04 ENCOUNTER — Telehealth: Payer: Self-pay | Admitting: Internal Medicine

## 2011-06-04 ENCOUNTER — Other Ambulatory Visit: Payer: Self-pay | Admitting: Internal Medicine

## 2011-06-04 MED ORDER — HYDROCODONE-ACETAMINOPHEN 5-500 MG PO TABS: ORAL_TABLET | ORAL | Status: DC

## 2011-06-04 NOTE — Telephone Encounter (Signed)
Follow up Call- Patient questions:  Do you have a fever, pain , or abdominal swelling? no Pain Score  0 *  Have you tolerated food without any problems? yes  Have you been able to return to your normal activities? yes  Do you have any questions about your discharge instructions: Diet   no Medications  no Follow up visit  no  Do you have questions or concerns about your Care? no  Actions: * If pain score is 4 or above: No action needed, pain <4.  Patient states "just abdominal pain like i usually have".  Patient denies any fever or problems. Encouraged patient to call us back if needed. She understands.

## 2011-06-04 NOTE — Telephone Encounter (Signed)
Dr Olevia Perches states that patient will likely need Humira or Remicade. However, she would like for patient to have a TB skin test done before initiation of Biologics. She also states that she would like patient to have #40 hydrocodone but this rx must last a FULL month. Patient verbalizes understanding. She will come for TB test on Monday and will then pick up information on Remicade and come up with any questions she may have. Her insurance apparently prefers Remicade so we may start with that.

## 2011-06-04 NOTE — Telephone Encounter (Signed)
SPOKE WITH PATIENT,SHE HAD QUESTIONS ABOUT NEW MEDICATION THAT DR.[name] TOLD HER SISTER YESTERDAY THAT SHE MAY BE STARTED ON. ALSO SHE WANTS MORE HYDROCODONE FOR PAIN. EXPLAINED THIS TO DR.[name] AT THIS TIME. SHE STATES SHE WANTS TO WAIT ON BIOPSY RESULT BEFORE NEW MEDICATIONS AND THAT SHE WILL NEED OFFICE VISIT TO DISCUSS THIS BEFORE BEING THE IV REMICADE OR HUMARIA. DR.[name] ALSO AT THIS TIME CALLED DOTTIE,CMA & TOLD HER TO SET UP TB SKIN TEST FOR PATIENT AND TOLD HER SHE MAY HAVE MORE HYDROCODONE IF SHE WAS THE MD PRESCRIBING. AFTER SPEAKING WITH DR.[name] I CALLED THE PATIENT AND EXPLAINED THIS INFORMATION TO HER, SHE UNDERSTANDS. NO FURTHER QUESTIONS/CONCERNS AT THIS TIME.

## 2011-06-07 ENCOUNTER — Ambulatory Visit (INDEPENDENT_AMBULATORY_CARE_PROVIDER_SITE_OTHER): Payer: Medicare Other

## 2011-06-07 ENCOUNTER — Other Ambulatory Visit: Payer: Self-pay | Admitting: Gynecology

## 2011-06-07 ENCOUNTER — Encounter: Payer: Self-pay | Admitting: Gynecology

## 2011-06-07 ENCOUNTER — Ambulatory Visit (INDEPENDENT_AMBULATORY_CARE_PROVIDER_SITE_OTHER): Payer: Medicare Other | Admitting: Internal Medicine

## 2011-06-07 ENCOUNTER — Ambulatory Visit (INDEPENDENT_AMBULATORY_CARE_PROVIDER_SITE_OTHER): Payer: Medicare Other | Admitting: Gynecology

## 2011-06-07 VITALS — BP 118/70

## 2011-06-07 DIAGNOSIS — N83209 Unspecified ovarian cyst, unspecified side: Secondary | ICD-10-CM

## 2011-06-07 DIAGNOSIS — K5190 Ulcerative colitis, unspecified, without complications: Secondary | ICD-10-CM

## 2011-06-07 DIAGNOSIS — D131 Benign neoplasm of stomach: Secondary | ICD-10-CM

## 2011-06-07 DIAGNOSIS — N83201 Unspecified ovarian cyst, right side: Secondary | ICD-10-CM

## 2011-06-07 DIAGNOSIS — D3910 Neoplasm of uncertain behavior of unspecified ovary: Secondary | ICD-10-CM

## 2011-06-07 LAB — POCT I-STAT, CHEM 8
BUN: 29 — ABNORMAL HIGH
Calcium, Ion: 1.17
Chloride: 109
Creatinine, Ser: 1.8 — ABNORMAL HIGH
Glucose, Bld: 98
HCT: 37
Hemoglobin: 12.6
Potassium: 5.3 — ABNORMAL HIGH
Sodium: 135
TCO2: 20

## 2011-06-07 MED ORDER — TUBERCULIN PPD 5 UNIT/0.1ML ID SOLN
5.0000 [IU] | Freq: Once | INTRADERMAL | Status: AC
  Administered 2011-06-07: 5 [IU] via INTRADERMAL

## 2011-06-07 MED ORDER — MEDROXYPROGESTERONE ACETATE 150 MG/ML IM SUSP
150.0000 mg | Freq: Once | INTRAMUSCULAR | Status: AC
  Administered 2011-06-07: 150 mg via INTRAMUSCULAR

## 2011-06-07 NOTE — Patient Instructions (Signed)
[name], has per our office visit today the ultrasound demonstrated that she had a small right ovarian cyst that appears to be benign by its configuration nevertheless based on your age we are going to get a C1 25 ovarian cancer tumor antigen. As per our conversation I explained to the distress has is limitations. Will followup with an ultrasound in 3 months. We did do your shot today of Depo-Provera intramuscularly in an effort to suppress this benign-appearing cyst. It is still persistent in 3 months or if you need any major abdominal surgery due to your ulcerated colitis we would try to coordinate with the general surgeon and perhaps consider removing that right tube and ovary in the same setting.

## 2011-06-07 NOTE — Progress Notes (Signed)
Patient presented to the office today for an ultrasound that was ordered due to the fact that she recently been evaluated by the urologist because of low back pain and gross hematuria and appears that she had passed a kidney stone. At time of the CT scan a right 2.8 cm right ovarian cyst had been noted as part of her annual exam recently we will order the ultrasound which she return today to have done to discuss his findings.  Ultrasound report demonstrated absence of the uterus due to the fact that she had a prior hysterectomy and left subcutaneous vitrectomy. Right ovary with a thin wall cystic mass, with scattered internal low level echoes irregular shaped cyst approximately 3.7 x 3.8 x 2.5 cm with positive color flow in the wall of the cyst. Thick walled follicle 9 x 7 mm with cystic lumen negative color flow was noted along with solid cystic area 9 x 8 mm with microcalcification the wall of the cyst negative color flow negative fluid in the cul-de-sac.  The findings were discussed with the patient bites appearance this does not appear to be suspicious nevertheless will order a CA 125, its limitations of the test results and discuss with the patient. We'll give her a shot of Depo-Provera 150 mg IM to see we can suppress the cyst and she'll return back to the office in 3 months for followup ultrasound. Recent Pap smear was negative. Instructions provided all questions answered and we'll follow accordingly.

## 2011-06-08 ENCOUNTER — Telehealth: Payer: Self-pay | Admitting: *Deleted

## 2011-06-08 LAB — CA 125: CA 125: 7.3 U/mL (ref 0.0–30.2)

## 2011-06-08 NOTE — Telephone Encounter (Signed)
I understand that pt not able to aggord IBD markers. I have talked to the pathologist about her biopsies and it looks more and more like Crohn's. Would initiate Biologicals if she agrees, otherwise I need to see her on the office when I get back.

## 2011-06-08 NOTE — Telephone Encounter (Signed)
Calling patient re: IBD antibodies. She had an CT that has shown something on her ovary. Had ultrasound yesterday and Estrogen shot to try to shrink it. Waiting on CA-125 results.  Per patient they are waiting until January to see if this shrinks because they are not wanting to do surgery to remove her ovary. She is concerned about starting Remicade with all of this going on.  She states she cannot afford the IBD markers test. Please, advise.

## 2011-06-09 ENCOUNTER — Ambulatory Visit: Payer: Medicare Other | Admitting: Internal Medicine

## 2011-06-09 ENCOUNTER — Encounter: Payer: Self-pay | Admitting: Internal Medicine

## 2011-06-09 LAB — READ PPD: TB Skin Test: NEGATIVE mm

## 2011-06-09 NOTE — Telephone Encounter (Signed)
Spoke with patient and she is going to talk to Dr. Zella Richer today about her pouch and ovarian cyst. She will call me after she talks with him and let me know if she is ready to start the biologicals. Note to check with patient in several days and confirm her decision

## 2011-06-09 NOTE — Progress Notes (Signed)
This encounter was created in error - please disregard.

## 2011-06-10 ENCOUNTER — Encounter: Payer: Self-pay | Admitting: Internal Medicine

## 2011-06-11 ENCOUNTER — Encounter: Payer: Self-pay | Admitting: Internal Medicine

## 2011-06-11 NOTE — Progress Notes (Signed)
Spoke with patient at her TB skin test reading (negative). She states that she has looked over the Remicade information. However, she is not ready to begin the medication due to a recent "ovarian mass" that needs to be worked up first. Patient states that she will be seeing Dr Zella Richer to discuss before beginning Remicade. Dr Olevia Perches has been advised and has been in touch with Dr Zella Richer regarding this.

## 2011-06-15 ENCOUNTER — Telehealth: Payer: Self-pay | Admitting: *Deleted

## 2011-06-15 NOTE — Telephone Encounter (Signed)
Pt called wanting ca-125 results, results given to pt.

## 2011-06-16 ENCOUNTER — Telehealth: Payer: Self-pay | Admitting: Internal Medicine

## 2011-06-16 MED ORDER — INFLIXIMAB 100 MG IV SOLR: INTRAVENOUS | Status: DC

## 2011-06-16 NOTE — Telephone Encounter (Signed)
I have spoken to [place] @ [facility]. She has scheduled infusion on 06/24/11 @ 10, 07/08/11 @ 10 and 08/05/11 @ 10. Patient to schedule future appointments after that time. Orders have been sent to [facility]. I have left a message for patient to call back.

## 2011-06-16 NOTE — Telephone Encounter (Signed)
Patient would now like to begin Remicade. Would you like me to to ahead and initiate? She states that she still has an appointment with Dr Zella Richer on 06/22/11 to discuss ovarian "mass."

## 2011-06-16 NOTE — Telephone Encounter (Signed)
Patient has been advised regarding times and dates of remicade infusions. She verbalizes understanding.

## 2011-06-16 NOTE — Telephone Encounter (Signed)
Yes, I would like her to start her Remicade induction course, at 5mg /kk as per protocol.I would also like to add Benadryl 25mg  and Ativan .5 mg po at the start of the infusions.

## 2011-06-22 ENCOUNTER — Ambulatory Visit (INDEPENDENT_AMBULATORY_CARE_PROVIDER_SITE_OTHER): Payer: Medicare Other | Admitting: General Surgery

## 2011-06-22 ENCOUNTER — Encounter (INDEPENDENT_AMBULATORY_CARE_PROVIDER_SITE_OTHER): Payer: Self-pay | Admitting: General Surgery

## 2011-06-22 DIAGNOSIS — R1032 Left lower quadrant pain: Secondary | ICD-10-CM | POA: Insufficient documentation

## 2011-06-22 NOTE — Progress Notes (Signed)
Chief Complaint  Patient presents with  . Other    eval for jpouch and discuss ovarian cyst removal     HPI [name] is a 55 y.o. female.   HPI  She is referred by Dr. Toney Rakes for evaluation of possible right ovary surgery. She has a cystic lesion on the right ovary. Dr. Toney Rakes is treating this with hormonal therapy trying to shrink it. Also, she has an inflammatory process at the anastomosis between her J.-pouch and anal area. Dr. Olevia Perches wants to treat her with Remicade. She is hesitant to take this and wanted to talk with me about it. She has been having some left lower quadrant pain after moving a heavy object this past summer. She's been getting hydrocodone for that. She is concerned she is told her pouch away from its attachments. She had a flexible sigmoidoscopy. This demonstrated inflammation at the pouch-anal anastomosis. This was biopsied. The results suggested Crohn's disease even though her previous biopsies prior to her surgery were consistent with ulcerative colitis.  Past Medical History  Diagnosis Date  . History of small bowel obstruction   . Obesity   . Hyperparathyroidism   . Hypokalemia   . Hypertension   . Gastric polyp   . GERD (gastroesophageal reflux disease)   . Depression   . Anxiety   . Anemia   . Renal insufficiency   . Ulcerative colitis   . RLS (restless legs syndrome)   . Pulmonary nodule, right     right upper lobe  . Anal stenosis   . Esophagitis   . De Quervain's tenosynovitis   . Hyperlipidemia   . Fibroid   . History of cervical dysplasia   . Asthma   . Hearing loss   . Visual disturbance   . Abdominal pain   . Rectal bleeding   . Blood in stool   . Rectal pain   . Difficulty urinating   . Blood in urine   . Fainting     due to dehydration  . Weakness generalized   . Fatigue   . Easy bruising   . Hemorrhoids, internal   . Hemorrhoids, external     Past Surgical History  Procedure Date  . Anal dilation   . Restorative  proctocolectomy     with insertion of ileoanal J Pouch with loop ileostomy  . Ileostomy closure   . Cholecystectomy   . Tubal ligation   . Hemorrhoid surgery   . Fatty tumor removed from back   . Total abdominal hysterectomy 1998    TAH/LSO  . Cervical biopsy  w/ loop electrode excision   . Upper gastrointestinal endoscopy     Family History  Problem Relation Age of Onset  . Heart disease Father   . Ulcerative colitis Father   . Hypertension Father   . Ulcerative colitis Daughter   . Irritable bowel syndrome      grandchildren  . Colon cancer Neg Hx   . Hypertension Mother   . Heart disease Mother   . Diabetes Sister   . Cancer Sister     uterine  . Cancer Maternal Uncle     LUNG    Social History History  Substance Use Topics  . Smoking status: Former Smoker    Types: Cigarettes    Quit date: 05/21/1979  . Smokeless tobacco: Never Used   Comment: quit age 45  . Alcohol Use: No    Allergies  Allergen Reactions  . Morphine   .  Sulfonamide Derivatives     Current Outpatient Prescriptions  Medication Sig Dispense Refill  . BIOTIN 5000 PO Take by mouth daily.        . ciprofloxacin (CIPRO) 250 MG tablet TAKE 1 TABLET (250 MG TOTAL)  BY MOUTH DAILY.  30 tablet  0  . clorazepate (TRANXENE) 15 MG tablet Take 15 mg by mouth 3 (three) times daily as needed.        . diphenoxylate-atropine (LOMOTIL) 2.5-0.025 MG per tablet Take 1 tablet by mouth 3 (three) times daily as needed.        . DULoxetine (CYMBALTA) 60 MG capsule Take 2 tablets by mouth once daily       . HYDROcodone-acetaminophen (VICODIN) 5-500 MG per tablet Take 1 tablet by mouth every 4-6 hours as needed for pain. Must last 30 days  40 tablet  1  . hydrocortisone (ANUSOL-HC) 2.5 % rectal cream Place rectally 2 (two) times daily.        . mesalamine (CANASA) 1000 MG suppository Insert one rectally BID  60 suppository  2  . metoprolol (TOPROL-XL) 100 MG 24 hr tablet 50 mg daily.       . metroNIDAZOLE  (FLAGYL) 250 MG tablet TAKE 1 TABLET BY MOUTH ONCE EVERY OTHER DAY (ALTERNATE WITH CIPRO AS DISCUSSED AT YOUR OFFICE VISIT)  30 tablet  0  . Multiple Vitamins-Minerals (MULTIVITAMIN WITH MINERALS) tablet Take 1 tablet by mouth daily.        . pantoprazole (PROTONIX) 40 MG tablet TAKE ONE TABLET BY MOUTH EVERY DAY  90 tablet  0  . inFLIXimab (REMICADE) 100 MG injection Infuse 5 mg/kg IV at 0 weeks, 2 weeks, 6 weeks and then every 8 weeks thereafter. Give Benadryl 25 mg 1 tablet by mouth and/or acetaminophen 650 mg by mouth before each infusion. Also add Ativan 0.5 mg 1 tablet by mouth before infusions. TB test: 06/09/11 Negative (0 mm) Ht: 5'4'' Wt: 157 pounds  Dx: Ulcerative Colitis  3 each  0    Review of Systems Review of Systems  Constitutional: Negative.   Respiratory: Negative.   Cardiovascular: Negative.   Gastrointestinal:       Intermittent left lower quadrant abdominal pain. Intermittent anal leakage of stool.    Blood pressure 116/76, pulse 64, temperature 98.6 F (37 C), resp. rate 18, height 5' 3"  (1.6 m), weight 154 lb (69.854 kg).  Physical Exam Physical Exam  Constitutional: No distress.       Overweight.  Abdominal: Soft. She exhibits no distension and no mass. There is no tenderness.       Well-healed midline and right lower quadrant scars. No abdominal wall hernias.  Genitourinary:       No inguinal hernias palpable.    Data Reviewed Notes from Dr. Olevia Perches and Dr. Toney Rakes.  Assessment    Left lower quadrant pain, recurrent proctitis, right ovarian cystic lesion. She is wondering if she should take her Remicade. She might need to have her right ovary removed.    Plan      I advised her to try the Remicade. As for her possible right ovary removal, if she has this done it should be somewhere where a colorectal surgeon would be available in case the pouch was needs repair. I explained to her I no longer do that type of surgery.   [name]  J 06/22/2011, 11:36 AM

## 2011-06-24 ENCOUNTER — Encounter (HOSPITAL_COMMUNITY): Payer: Medicare Other | Attending: Internal Medicine

## 2011-06-24 DIAGNOSIS — K5190 Ulcerative colitis, unspecified, without complications: Secondary | ICD-10-CM | POA: Insufficient documentation

## 2011-06-28 ENCOUNTER — Other Ambulatory Visit: Payer: Self-pay | Admitting: Internal Medicine

## 2011-07-01 ENCOUNTER — Encounter (INDEPENDENT_AMBULATORY_CARE_PROVIDER_SITE_OTHER): Payer: Medicare Other | Admitting: General Surgery

## 2011-07-08 ENCOUNTER — Encounter (HOSPITAL_COMMUNITY): Payer: Medicare Other

## 2011-07-08 DIAGNOSIS — K5190 Ulcerative colitis, unspecified, without complications: Secondary | ICD-10-CM | POA: Insufficient documentation

## 2011-07-13 ENCOUNTER — Other Ambulatory Visit: Payer: Self-pay | Admitting: Internal Medicine

## 2011-07-19 ENCOUNTER — Other Ambulatory Visit: Payer: Self-pay | Admitting: Internal Medicine

## 2011-07-23 ENCOUNTER — Telehealth: Payer: Self-pay | Admitting: Internal Medicine

## 2011-07-23 MED ORDER — ALPRAZOLAM 0.5 MG PO TABS: 0.5000 mg | ORAL_TABLET | Freq: Three times a day (TID) | ORAL | Status: DC | PRN

## 2011-07-23 NOTE — Telephone Encounter (Signed)
Dr Olevia Perches- Patient states that her insurance will no longer cover tranxene as "xanax is cheaper and does the same thing." Do you want me to change her over to xanax since it will be more affordable for her?

## 2011-07-23 NOTE — Telephone Encounter (Signed)
Rx sent to [facility] in [facility] on [address]. Patient advised.

## 2011-07-23 NOTE — Telephone Encounter (Signed)
OK to switch to Aprazolam .5 mg, #90, 1 po tid prn. 1 refill

## 2011-07-23 NOTE — Telephone Encounter (Signed)
Left voicemail for patient to call back. 

## 2011-07-28 ENCOUNTER — Other Ambulatory Visit: Payer: Self-pay | Admitting: Internal Medicine

## 2011-08-03 ENCOUNTER — Ambulatory Visit (INDEPENDENT_AMBULATORY_CARE_PROVIDER_SITE_OTHER): Payer: Medicare Other

## 2011-08-03 DIAGNOSIS — Z23 Encounter for immunization: Secondary | ICD-10-CM

## 2011-08-04 ENCOUNTER — Other Ambulatory Visit (HOSPITAL_COMMUNITY): Payer: Self-pay | Admitting: *Deleted

## 2011-08-05 ENCOUNTER — Encounter (HOSPITAL_COMMUNITY): Payer: Self-pay

## 2011-08-05 ENCOUNTER — Encounter (HOSPITAL_COMMUNITY)
Admission: RE | Admit: 2011-08-05 | Discharge: 2011-08-05 | Disposition: A | Discharge status: Home / Self Care | Payer: Medicare Other | Source: Ambulatory Visit | Attending: Internal Medicine | Admitting: Internal Medicine

## 2011-08-05 MED ORDER — LORAZEPAM 0.5 MG PO TABS
0.5000 mg | ORAL_TABLET | ORAL | Status: AC
  Administered 2011-08-05: 0.5 mg via ORAL
  Filled 2011-08-05: qty 1

## 2011-08-05 MED ORDER — SODIUM CHLORIDE 0.9 % IV SOLN
INTRAVENOUS | Status: AC
  Administered 2011-08-05: 11:00:00 via INTRAVENOUS

## 2011-08-05 MED ORDER — DIPHENHYDRAMINE HCL 25 MG PO CAPS
25.0000 mg | ORAL_CAPSULE | ORAL | Status: AC
Start: 2011-08-05 — End: 2011-08-05
  Administered 2011-08-05: 25 mg via ORAL

## 2011-08-05 MED ORDER — ACETAMINOPHEN 325 MG PO TABS
650.0000 mg | ORAL_TABLET | ORAL | Status: AC
  Administered 2011-08-05: 650 mg via ORAL

## 2011-08-05 MED ORDER — INFLIXIMAB 100 MG IV SOLR
5.0000 mg/kg | INTRAVENOUS | Status: AC
  Administered 2011-08-05: 300 mg via INTRAVENOUS
  Filled 2011-08-05: qty 30

## 2011-08-21 ENCOUNTER — Other Ambulatory Visit: Payer: Self-pay | Admitting: Internal Medicine

## 2011-08-21 ENCOUNTER — Other Ambulatory Visit (HOSPITAL_COMMUNITY): Payer: Self-pay | Admitting: Physician Assistant

## 2011-08-23 ENCOUNTER — Other Ambulatory Visit: Payer: Self-pay | Admitting: *Deleted

## 2011-08-23 ENCOUNTER — Other Ambulatory Visit (HOSPITAL_COMMUNITY): Payer: Self-pay | Admitting: Physician Assistant

## 2011-08-23 MED ORDER — CIPROFLOXACIN HCL 250 MG PO TABS: 250.0000 mg | ORAL_TABLET | Freq: Every day | ORAL | Status: DC

## 2011-09-08 ENCOUNTER — Ambulatory Visit (INDEPENDENT_AMBULATORY_CARE_PROVIDER_SITE_OTHER): Payer: Medicare Other | Admitting: Gynecology

## 2011-09-08 ENCOUNTER — Ambulatory Visit: Payer: Medicare Other | Admitting: Gynecology

## 2011-09-08 ENCOUNTER — Ambulatory Visit: Payer: Medicare Other

## 2011-09-08 ENCOUNTER — Ambulatory Visit (INDEPENDENT_AMBULATORY_CARE_PROVIDER_SITE_OTHER): Payer: Medicare Other

## 2011-09-08 ENCOUNTER — Encounter: Payer: Self-pay | Admitting: Gynecology

## 2011-09-08 VITALS — BP 122/80

## 2011-09-08 DIAGNOSIS — D3910 Neoplasm of uncertain behavior of unspecified ovary: Secondary | ICD-10-CM

## 2011-09-08 DIAGNOSIS — N83201 Unspecified ovarian cyst, right side: Secondary | ICD-10-CM

## 2011-09-08 DIAGNOSIS — N83209 Unspecified ovarian cyst, unspecified side: Secondary | ICD-10-CM

## 2011-09-08 NOTE — Progress Notes (Signed)
Patient 56 years old the presented to the office today for followup of ultrasound as a result of an incidental finding on CT scan during the workup for nephrolithiasis by urologist. Patient spontaneously pass kidney stone. The CT scan demonstrated a right ovarian cyst measuring 2.8 cm. She had an ultrasound in our office on October 1. Patient with prior history of total abdominal hysterectomy with left salpingo-oophorectomy. The ultrasound demonstrated a thin wall cyst with scattered internal low level echoes it was irregular shaped cyst measuring 3.7 x 3.8 x 2.5 cm with positive color flow and the wall of this cyst. Patient had a normal CA 125 although patient aware of its limitations. The shot of Depo-Provera 150 mg IM was administered she was asked to return today for followup ultrasound.   Patient has a history of Crohn's disease and has been followed by the GI Dr. Maurene Capes. Also she's been followed by the general surgeon Dr. Zella Richer. Patient with history of bowel resection and currently has a J-pouch. Patient been complaining some left lower quadrant discomfort in the past. Patient had a flexible sigmoidoscopy demonstrated inflammation of the pouch/anal anastomoses which were biopsied and has been followed by the above providers.  Ultrasound today demonstrates at the right ovarian cyst has gotten smaller has thinwall with very little level echoes negative color flow. 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  The findings were discussed with the patient cyst appears to be benign we'll have her return back in 6 months for followup ultrasound. If she continues to have low pelvic pain discomfort and she discussed with Dr. Zella Richer he had recommended that patient be followed by colorectal surgeon in case the pouch needs repair and at that time a GYN consultation can be made to look at her remaining right ovary. At the present time the risk outweighed the benefit for an insignificant  appearing benign ovarian cyst. Her symptoms may be attributed to her inflammatory bowel disease.

## 2011-09-08 NOTE — Patient Instructions (Signed)
Will follow up with u/s 6 months

## 2011-09-10 ENCOUNTER — Telehealth: Payer: Self-pay | Admitting: Internal Medicine

## 2011-09-10 ENCOUNTER — Encounter: Payer: Self-pay | Admitting: Gynecology

## 2011-09-10 NOTE — Telephone Encounter (Signed)
Patient states that she was given cymbalta by another physician but they did not want to see her any longer since she went to a psychologist. However, the psychologist released her as well. I have advised her that she will need to get cymbalta from her primary care physician since this is not a GI medication. Patient verbalizes understanding.

## 2011-09-13 ENCOUNTER — Encounter: Payer: Self-pay | Admitting: Family Medicine

## 2011-09-13 ENCOUNTER — Ambulatory Visit (INDEPENDENT_AMBULATORY_CARE_PROVIDER_SITE_OTHER): Payer: Medicare Other | Admitting: Family Medicine

## 2011-09-13 VITALS — BP 112/70 | HR 104 | Temp 98.6°F | Ht 64.0 in | Wt 159.0 lb

## 2011-09-13 DIAGNOSIS — F329 Major depressive disorder, single episode, unspecified: Secondary | ICD-10-CM

## 2011-09-13 DIAGNOSIS — Z23 Encounter for immunization: Secondary | ICD-10-CM

## 2011-09-13 DIAGNOSIS — F32A Depression, unspecified: Secondary | ICD-10-CM

## 2011-09-13 DIAGNOSIS — G8929 Other chronic pain: Secondary | ICD-10-CM

## 2011-09-13 MED ORDER — DULOXETINE HCL 60 MG PO CPEP: ORAL_CAPSULE | ORAL | Status: DC

## 2011-09-13 NOTE — Patient Instructions (Signed)
Depression, Adolescent and Adult Depression is a true and treatable medical condition. In general there are two kinds of depression:  Depression we all experience in some form. For example depression from the death of a loved one, financial distress or natural disasters will trigger or increase depression.   Clinical depression, on the other hand, appears without an apparent cause or reason. This depression is a disease. Depression may be caused by chemical imbalance in the body and brain or may come as a response to a physical illness. Alcohol and other drugs can cause depression.  DIAGNOSIS  The diagnosis of depression is usually based upon symptoms and medical history. TREATMENT  Treatments for depression fall into three categories. These are:  Drug therapy. There are many medicines that treat depression. Responses may vary and sometimes trial and error is necessary to determine the best medicines and dosage for a particular patient.   Psychotherapy, also called talking treatments, helps people resolve their problems by looking at them from a different point of view and by giving people insight into their own personal makeup. Traditional psychotherapy looks at a childhood source of a problem. Other psychotherapy will look at current conflicts and move toward solving those. If the cause of depression is drug use, counseling is available to help abstain. In time the depression will usually improve. If there were underlying causes for the chemical use, they can be addressed.   ECT (electroconvulsive therapy) or shock treatment is not as commonly used today. It is a very effective treatment for severe suicidal depression. During ECT electrical impulses are applied to the head. These impulses cause a generalized seizure. It can be effective but causes a loss of memory for recent events. Sometimes this loss of memory may include the last several months.  Treat all depression or suicide threats as  serious. Obtain professional help. Do not wait to see if serious depression will get better over time without help. Seek help for yourself or those around you. In the [place] the number to the [place] [facility] 24 Hour Help Are: 1-800-SUICIDE 770 127 5334 Document Released: 08/20/2000 Document Revised: 05/05/2011 Document Reviewed: 04/10/2008 [facility] Patient Information 2012 [place], [place].

## 2011-09-13 NOTE — Progress Notes (Signed)
  Subjective:    Patient ID: [name], female    DOB: [birth year], 56 y.o.   MRN: [id number]  HPI Pt is here to f/u depression and chronic pain.  She is doing well with cymbalta for both.   No other complaints.    Review of Systems As above    Objective:   Physical Exam  Constitutional: She is oriented to person, place, and time. She appears well-developed and well-nourished.  Neurological: She is alert and oriented to person, place, and time.  Psychiatric: She has a normal mood and affect. Her behavior is normal. Judgment and thought content normal.          Assessment & Plan:  Depression---  Refill cymbalta 60 mg  2 po qd                            rto 6 months

## 2011-09-29 ENCOUNTER — Other Ambulatory Visit: Payer: Self-pay | Admitting: *Deleted

## 2011-09-29 ENCOUNTER — Telehealth: Payer: Self-pay | Admitting: Internal Medicine

## 2011-09-29 MED ORDER — LORAZEPAM 0.5 MG PO TABS: 0.5000 mg | ORAL_TABLET | ORAL | Status: DC

## 2011-09-29 MED ORDER — ACETAMINOPHEN 325 MG PO TABS: 650.0000 mg | ORAL_TABLET | ORAL | Status: DC

## 2011-09-29 MED ORDER — SODIUM CHLORIDE 0.9 % IV SOLN: INTRAVENOUS | Status: DC

## 2011-09-29 MED ORDER — SODIUM CHLORIDE 0.9 % IV SOLN: 5.0000 mg/kg | INTRAVENOUS | Status: DC

## 2011-09-29 MED ORDER — DIPHENHYDRAMINE HCL 25 MG PO CAPS: 25.0000 mg | ORAL_CAPSULE | ORAL | Status: DC

## 2011-09-29 NOTE — Telephone Encounter (Signed)
Patient calling to report for the last 2 weeks, she hs had 10-15 liquid diarrhea stools/day. She also states she has been having abdominal pain. Her rectum is "raw" and bleeding.She is using Canasa  Supp. Nightly. She is due for her Remicade tomorrow.  She is asking if there is anything she can take between her Remicade treatments. Please, advise.

## 2011-09-30 ENCOUNTER — Encounter (HOSPITAL_COMMUNITY): Payer: Self-pay

## 2011-09-30 ENCOUNTER — Encounter (HOSPITAL_COMMUNITY)
Admission: RE | Admit: 2011-09-30 | Discharge: 2011-09-30 | Disposition: A | Discharge status: Home / Self Care | Payer: Medicare Other | Source: Ambulatory Visit | Attending: Internal Medicine | Admitting: Internal Medicine

## 2011-09-30 ENCOUNTER — Telehealth: Payer: Self-pay | Admitting: Internal Medicine

## 2011-09-30 DIAGNOSIS — K5190 Ulcerative colitis, unspecified, without complications: Secondary | ICD-10-CM | POA: Insufficient documentation

## 2011-09-30 MED ORDER — LORAZEPAM 0.5 MG PO TABS
0.5000 mg | ORAL_TABLET | ORAL | Status: DC
  Administered 2011-09-30: 0.5 mg via ORAL
  Filled 2011-09-30: qty 1

## 2011-09-30 MED ORDER — RIFAXIMIN 550 MG PO TABS: 550.0000 mg | ORAL_TABLET | Freq: Two times a day (BID) | ORAL | Status: DC

## 2011-09-30 MED ORDER — ACETAMINOPHEN 325 MG PO TABS
650.0000 mg | ORAL_TABLET | ORAL | Status: DC
  Administered 2011-09-30: 650 mg via ORAL

## 2011-09-30 MED ORDER — CHOLESTYRAMINE 4 G PO PACK: PACK | ORAL | Status: DC

## 2011-09-30 MED ORDER — DIPHENHYDRAMINE HCL 25 MG PO CAPS
25.0000 mg | ORAL_CAPSULE | ORAL | Status: DC
  Administered 2011-09-30: 25 mg via ORAL

## 2011-09-30 MED ORDER — SODIUM CHLORIDE 0.9 % IV SOLN
5.0000 mg/kg | INTRAVENOUS | Status: DC
  Administered 2011-09-30: 400 mg via INTRAVENOUS
  Filled 2011-09-30: qty 40

## 2011-09-30 MED ORDER — SODIUM CHLORIDE 0.9 % IV SOLN
INTRAVENOUS | Status: DC
  Administered 2011-09-30: 250 mL via INTRAVENOUS

## 2011-09-30 NOTE — Telephone Encounter (Signed)
Please start her on Xifaxan 550mg  po bid x 10days. I have samples in my office. It is expensive, Also start Questran 4gm packet 1 in glass of water or juice qd. #30, 1 refill

## 2011-09-30 NOTE — Telephone Encounter (Signed)
Rx sent to pharmacy. Rx up front for pick up. Patient aware.

## 2011-09-30 NOTE — Telephone Encounter (Signed)
Spoke with patient and clarified Xifaxan it taken BID and Questran is taken daily with water or juice. She will call if does not feel better or if symptoms get worse, fever.

## 2011-10-25 ENCOUNTER — Telehealth: Payer: Self-pay | Admitting: *Deleted

## 2011-10-25 MED ORDER — ALPRAZOLAM 0.5 MG PO TABS: 0.5000 mg | ORAL_TABLET | Freq: Three times a day (TID) | ORAL | Status: DC | PRN

## 2011-10-25 NOTE — Telephone Encounter (Signed)
rx sent

## 2011-11-12 ENCOUNTER — Other Ambulatory Visit: Payer: Self-pay | Admitting: *Deleted

## 2011-11-12 ENCOUNTER — Other Ambulatory Visit: Payer: Self-pay | Admitting: Internal Medicine

## 2011-11-12 MED ORDER — METOPROLOL SUCCINATE ER 100 MG PO TB24: ORAL_TABLET | ORAL | Status: DC

## 2011-11-12 MED ORDER — METRONIDAZOLE 250 MG PO TABS: 250.0000 mg | ORAL_TABLET | ORAL | Status: DC

## 2011-11-12 NOTE — Telephone Encounter (Signed)
Pt needs appointment then refill can be made. Pt is aware she has to call office to make appointment for refill to be made. Pt is aware of new location. Fax Received. Refill Completed. Hayven Croy Chowoe (R.M.A)

## 2011-11-23 ENCOUNTER — Telehealth: Payer: Self-pay | Admitting: Internal Medicine

## 2011-11-23 NOTE — Telephone Encounter (Signed)
Patient states she faxed papers over to be filled out several days ago.  Faxed forms as requested by patient.

## 2011-11-25 ENCOUNTER — Encounter (HOSPITAL_COMMUNITY)
Admission: RE | Admit: 2011-11-25 | Discharge: 2011-11-25 | Disposition: A | Discharge status: Home / Self Care | Payer: Medicare Other | Source: Ambulatory Visit | Attending: Internal Medicine | Admitting: Internal Medicine

## 2011-11-25 ENCOUNTER — Encounter (HOSPITAL_COMMUNITY): Payer: Self-pay

## 2011-11-25 DIAGNOSIS — K5190 Ulcerative colitis, unspecified, without complications: Secondary | ICD-10-CM | POA: Insufficient documentation

## 2011-11-25 MED ORDER — SODIUM CHLORIDE 0.9 % IV SOLN
INTRAVENOUS | Status: AC
  Administered 2011-11-25: 10:00:00 via INTRAVENOUS

## 2011-11-25 MED ORDER — DIPHENHYDRAMINE HCL 25 MG PO CAPS
25.0000 mg | ORAL_CAPSULE | ORAL | Status: AC
  Administered 2011-11-25: 25 mg via ORAL

## 2011-11-25 MED ORDER — ACETAMINOPHEN 325 MG PO TABS
650.0000 mg | ORAL_TABLET | ORAL | Status: AC
  Administered 2011-11-25: 650 mg via ORAL

## 2011-11-25 MED ORDER — SODIUM CHLORIDE 0.9 % IV SOLN
5.0000 mg/kg | INTRAVENOUS | Status: AC
  Administered 2011-11-25: 400 mg via INTRAVENOUS
  Filled 2011-11-25: qty 40

## 2011-11-25 NOTE — Progress Notes (Signed)
Pt declined ativan, stating it makes her too sleepy, and she is driving

## 2011-11-25 NOTE — Discharge Instructions (Signed)
Infliximab injection What is this medicine? INFLIXIMAB (in [place] i mab) is used to treat Crohn's disease and ulcerative colitis. It is also used to treat ankylosing spondylitis, psoriasis, and some forms of arthritis. This medicine may be used for other purposes; ask your health care provider or pharmacist if you have questions. What should I tell my health care provider before I take this medicine? They need to know if you have any of these conditions: -diabetes -exposure to tuberculosis -heart failure -hepatitis or liver disease -immune system problems -infection -lung or breathing disease, like COPD -multiple sclerosis -current or past resident of [place] or [place] -seizure disorder -an unusual or allergic reaction to infliximab, mouse proteins, other medicines, foods, dyes, or preservatives -pregnant or trying to get pregnant -breast-feeding How should I use this medicine? This medicine is for injection into a vein. It is usually given by a health care professional in a hospital or clinic setting. A special MedGuide will be given to you by the pharmacist with each prescription and refill. Be sure to read this information carefully each time. Talk to your pediatrician regarding the use of this medicine in children. Special care may be needed. Overdosage: If you think you have taken too much of this medicine contact a poison control center or emergency room at once. NOTE: This medicine is only for you. Do not share this medicine with others. What if I miss a dose? It is important not to miss your dose. Call your doctor or health care professional if you are unable to keep an appointment. What may interact with this medicine? Do not take this medicine with any of the following medications: -anakinra -rilonacept This medicine may also interact with the following medications: -vaccines This list may not describe all possible interactions. Give your health care provider  a list of all the medicines, herbs, non-prescription drugs, or dietary supplements you use. Also tell them if you smoke, drink alcohol, or use illegal drugs. Some items may interact with your medicine. What should I watch for while using this medicine? Visit your doctor or health care professional for regular checks on your progress. If you get a cold or other infection while receiving this medicine, call your doctor or health care professional. Do not treat yourself. This medicine may decrease your body's ability to fight infections. Before beginning therapy, your doctor may do a test to see if you have been exposed to tuberculosis. This medicine may make the symptoms of heart failure worse in some patients. If you notice symptoms such as increased shortness of breath or swelling of the ankles or legs, contact your health care provider right away. If you are going to have surgery or dental work, tell your health care professional or dentist that you have received this medicine. If you take this medicine for plaque psoriasis, stay out of the sun. If you cannot avoid being in the sun, wear protective clothing and use sunscreen. Do not use sun lamps or tanning beds/booths. What side effects may I notice from receiving this medicine? Side effects that you should report to your doctor or health care professional as soon as possible: -allergic reactions like skin rash, itching or hives, swelling of the face, lips, or tongue -chest pain -fever or chills, usually related to the infusion -muscle or joint pain -red, scaly patches or raised bumps on the skin -signs of infection - fever or chills, cough, sore throat, pain or difficulty passing urine -swollen lymph nodes in the neck, underarm,  or groin areas -unexplained weight loss -unusual bleeding or bruising -unusually weak or tired -yellowing of the eyes or skin Side effects that usually do not require medical attention (report to your doctor or health  care professional if they continue or are bothersome): -headache -heartburn or stomach pain -nausea, vomiting This list may not describe all possible side effects. Call your doctor for medical advice about side effects. You may report side effects to FDA at 1-800-FDA-1088. Where should I keep my medicine? This drug is given in a hospital or clinic and will not be stored at home. NOTE: This sheet is a summary. It may not cover all possible information. If you have questions about this medicine, talk to your doctor, pharmacist, or health care provider.  2012, [employer]vier/[employer] Standard. (04/10/2008 10:26:02 AM)

## 2011-12-17 ENCOUNTER — Encounter: Payer: Self-pay | Admitting: *Deleted

## 2011-12-28 ENCOUNTER — Encounter: Payer: Self-pay | Admitting: Internal Medicine

## 2011-12-28 ENCOUNTER — Other Ambulatory Visit (INDEPENDENT_AMBULATORY_CARE_PROVIDER_SITE_OTHER): Payer: Medicare Other

## 2011-12-28 ENCOUNTER — Ambulatory Visit (INDEPENDENT_AMBULATORY_CARE_PROVIDER_SITE_OTHER): Payer: Medicare Other | Admitting: Internal Medicine

## 2011-12-28 VITALS — BP 110/82 | HR 72 | Ht 64.0 in | Wt 157.8 lb

## 2011-12-28 DIAGNOSIS — K5190 Ulcerative colitis, unspecified, without complications: Secondary | ICD-10-CM

## 2011-12-28 DIAGNOSIS — R197 Diarrhea, unspecified: Secondary | ICD-10-CM

## 2011-12-28 DIAGNOSIS — E538 Deficiency of other specified B group vitamins: Secondary | ICD-10-CM

## 2011-12-28 DIAGNOSIS — D509 Iron deficiency anemia, unspecified: Secondary | ICD-10-CM

## 2011-12-28 DIAGNOSIS — D849 Immunodeficiency, unspecified: Secondary | ICD-10-CM

## 2011-12-28 LAB — CBC WITH DIFFERENTIAL/PLATELET
Basophils Absolute: 0.1 10*3/uL (ref 0.0–0.1)
Basophils Relative: 0.6 % (ref 0.0–3.0)
Eosinophils Absolute: 0.2 10*3/uL (ref 0.0–0.7)
Eosinophils Relative: 1.9 % (ref 0.0–5.0)
HCT: 36 % (ref 36.0–46.0)
Hemoglobin: 12.1 g/dL (ref 12.0–15.0)
Lymphocytes Relative: 26 % (ref 12.0–46.0)
Lymphs Abs: 3 10*3/uL (ref 0.7–4.0)
MCHC: 33.7 g/dL (ref 30.0–36.0)
MCV: 92 fl (ref 78.0–100.0)
Monocytes Absolute: 0.7 10*3/uL (ref 0.1–1.0)
Monocytes Relative: 6.1 % (ref 3.0–12.0)
Neutro Abs: 7.5 10*3/uL (ref 1.4–7.7)
Neutrophils Relative %: 65.4 % (ref 43.0–77.0)
Platelets: 419 10*3/uL — ABNORMAL HIGH (ref 150.0–400.0)
RBC: 3.92 Mil/uL (ref 3.87–5.11)
RDW: 13.8 % (ref 11.5–14.6)
WBC: 11.5 10*3/uL — ABNORMAL HIGH (ref 4.5–10.5)

## 2011-12-28 LAB — COMPREHENSIVE METABOLIC PANEL
ALT: 19 U/L (ref 0–35)
AST: 21 U/L (ref 0–37)
Albumin: 3.9 g/dL (ref 3.5–5.2)
Alkaline Phosphatase: 56 U/L (ref 39–117)
BUN: 18 mg/dL (ref 6–23)
CO2: 23 mEq/L (ref 19–32)
Calcium: 9.5 mg/dL (ref 8.4–10.5)
Chloride: 106 mEq/L (ref 96–112)
Creatinine, Ser: 1.3 mg/dL — ABNORMAL HIGH (ref 0.4–1.2)
GFR: 43.17 mL/min — ABNORMAL LOW (ref >60.00)
Glucose, Bld: 100 mg/dL — ABNORMAL HIGH (ref 70–99)
Potassium: 4.4 mEq/L (ref 3.5–5.1)
Sodium: 138 mEq/L (ref 135–145)
Total Bilirubin: 0.5 mg/dL (ref 0.3–1.2)
Total Protein: 8 g/dL (ref 6.0–8.3)

## 2011-12-28 LAB — IBC PANEL
Iron: 88 ug/dL (ref 42–145)
Saturation Ratios: 28.4 % (ref 20.0–50.0)
Transferrin: 221.2 mg/dL (ref 212.0–360.0)

## 2011-12-28 LAB — VITAMIN B12: Vitamin B-12: 336 pg/mL (ref 211–911)

## 2011-12-28 NOTE — Patient Instructions (Addendum)
We have sent the following medications to your pharmacy for you to pick up at your convenience: CBC, IBC, B12, CMET Please continue Remicade every 8 weeks. Please follow up with Dr Olevia Perches in 6 months. CC: Erin Gray,PA-C,Dr [employer]

## 2011-12-28 NOTE — Progress Notes (Signed)
[name] [birth year] MRN [id number]   History of Present Illness:  This is a 56 year old white female with ulcerative colitis since 36. She underwent a total colectomy and ileoproctostomy in 1998 for intractable disease. She has a history of pouchitis. Her last flexible sigmoidoscopy in September 2012 showed active inflammatory bowel disease at the anastomosis at 20 cm. She was started on Remicade infusions 5 mg/kg after an induction regimen in November 2012 and has so far had 6 infusions. She feels  markedly improved in terms of her pouchitis as well as her over all feeling of well-being. Her diarrhea has improved and her level of energy has improved as well. She still has intermittent left lower quadrant abdominal pain which gets worse with a high fiber diet. She had a remote cholecystectomy as well as a hysterectomy. She has intentional weight loss. She also has a history of anxiety and depression. She has been followed for renal insufficiency by Dr Servando Salina. She has been on Flagyl and Cipro for pouchitis but would like to stop it because the Remicade has improved her symptoms.   Past Medical History  Diagnosis Date  . History of small bowel obstruction   . Obesity   . Hyperparathyroidism   . Hypokalemia   . Hypertension   . Gastric polyp   . GERD (gastroesophageal reflux disease)   . Depression   . Anxiety   . Anemia   . Renal insufficiency   . Ulcerative colitis   . RLS (restless legs syndrome)   . Pulmonary nodule, right     right upper lobe  . Anal stenosis   . Esophagitis   . De Quervain's tenosynovitis   . Hyperlipidemia   . Fibroid   . History of cervical dysplasia   . Asthma   . Hearing loss   . Visual disturbance   . Abdominal pain   . Rectal bleeding   . Blood in stool   . Rectal pain   . Difficulty urinating   . Blood in urine   . Fainting     due to dehydration  . Weakness generalized   . Fatigue   . Easy bruising   . Hemorrhoids, internal   .  Hemorrhoids, external   . Ovarian cyst    Past Surgical History  Procedure Date  . Anal dilation   . Restorative proctocolectomy     with insertion of ileoanal J Pouch with loop ileostomy  . Ileostomy closure   . Cholecystectomy   . Tubal ligation   . Hemorrhoid surgery   . Fatty tumor removed from back   . Total abdominal hysterectomy 1998    TAH/LSO  . Cervical biopsy  w/ loop electrode excision   . Upper gastrointestinal endoscopy     reports that she quit smoking about 32 years ago. Her smoking use included Cigarettes. She has never used smokeless tobacco. She reports that she does not drink alcohol or use illicit drugs. family history includes Cancer in her maternal uncle and sister; Diabetes in her sister; Heart disease in her father and mother; Hypertension in her father and mother; Irritable bowel syndrome in an unspecified family member; and Ulcerative colitis in her daughter and father.  There is no history of Colon cancer. Allergies  Allergen Reactions  . Morphine   . Sulfonamide Derivatives         Review of Systems:Denies acid reflux, shortness of breath chest pain  The remainder of the 10 point ROS is negative except as  outlined in H&P   Physical Exam: General appearance  Well developed, in no distress. Eyes- non icteric. HEENT nontraumatic, normocephalic. Mouth no lesions, tongue papillated, no cheilosis. Neck supple without adenopathy, thyroid not enlarged, no carotid bruits, no JVD. Lungs Clear to auscultation bilaterally. Cor normal S1, normal S2, regular rhythm, no murmur,  quiet precordium. Abdomen: Soft relaxed wiith normoactive bowel sounds. Mild tenderness left lower quadrant. No palpable mass or fullness.  Rectal:Noted on  Extremities no pedal edema. Skin no lesions. Neurological alert and oriented x 3. Psychological normal mood and affect.  Assessment and Plan:  Problem #1 Ulcerative colitis. Patient is status post total colectomy and  ileoproctostomy. She has intermittent pouchitis. Patient started tRemicade infusions 4 months ago and has clinically improved. We are planning to continue Remicade infusions 5 mg/kg every 8 weeks and will taper her Cipro and Flagyl. She will have labs checked today. I will see her in 6 months.  Problem #2 Depression and anxiety. Patient is taking Cymbalta and Xanax 1 mg at bedtime. She is doing well.  Problem #3 Chronic renal insufficiency followed by the renal service.  Problem #4 History of iron deficiency anemia. Her last hemoglobin 2 months ago was 11.3, hematocrit 30.5 and iron saturation was 23%. We are checking her blood count, B12 and iron levels today.   12/28/2011 [name]

## 2012-01-07 ENCOUNTER — Telehealth: Payer: Self-pay | Admitting: Internal Medicine

## 2012-01-07 NOTE — Telephone Encounter (Signed)
Patient calling to report some problems with her stomach and she would like to move her Remicade up to every 7 weeks instead of every 8 weeks. Please, advise

## 2012-01-07 NOTE — Telephone Encounter (Signed)
I prefer to leave it  At every 8 weeks for now because remicade has been very effective and  I don't have  An impression that she absolutely needs it every 7 weeks. We will discuss it at her next visit.

## 2012-01-10 NOTE — Telephone Encounter (Signed)
Spoke with patient and told her Dr. Nichola Sizer recommendation.

## 2012-01-20 ENCOUNTER — Encounter (HOSPITAL_COMMUNITY)
Admission: RE | Admit: 2012-01-20 | Discharge: 2012-01-20 | Disposition: A | Discharge status: Home / Self Care | Payer: Medicare Other | Source: Ambulatory Visit | Attending: Internal Medicine | Admitting: Internal Medicine

## 2012-01-20 ENCOUNTER — Encounter (HOSPITAL_COMMUNITY): Payer: Self-pay

## 2012-01-20 DIAGNOSIS — K5190 Ulcerative colitis, unspecified, without complications: Secondary | ICD-10-CM | POA: Insufficient documentation

## 2012-01-20 MED ORDER — DIPHENHYDRAMINE HCL 25 MG PO CAPS
25.0000 mg | ORAL_CAPSULE | ORAL | Status: AC
  Administered 2012-01-20: 25 mg via ORAL

## 2012-01-20 MED ORDER — SODIUM CHLORIDE 0.9 % IV SOLN
INTRAVENOUS | Status: AC
  Administered 2012-01-20: 09:00:00 via INTRAVENOUS

## 2012-01-20 MED ORDER — ACETAMINOPHEN 325 MG PO TABS
650.0000 mg | ORAL_TABLET | ORAL | Status: AC
  Administered 2012-01-20: 650 mg via ORAL

## 2012-01-20 MED ORDER — SODIUM CHLORIDE 0.9 % IV SOLN
5.0000 mg/kg | INTRAVENOUS | Status: AC
  Administered 2012-01-20: 400 mg via INTRAVENOUS
  Filled 2012-01-20: qty 40

## 2012-01-20 MED ORDER — ACETAMINOPHEN 325 MG PO TABS
ORAL_TABLET | ORAL | Status: AC
  Filled 2012-01-20: qty 2

## 2012-01-20 MED ORDER — DIPHENHYDRAMINE HCL 25 MG PO CAPS
ORAL_CAPSULE | ORAL | Status: AC
  Administered 2012-01-20: 25 mg via ORAL
  Filled 2012-01-20: qty 1

## 2012-01-20 NOTE — Discharge Instructions (Signed)
Infliximab injection What is this medicine? INFLIXIMAB (in [place] i mab) is used to treat Crohn's disease and ulcerative colitis. It is also used to treat ankylosing spondylitis, psoriasis, and some forms of arthritis. This medicine may be used for other purposes; ask your health care provider or pharmacist if you have questions. What should I tell my health care provider before I take this medicine? They need to know if you have any of these conditions: -diabetes -exposure to tuberculosis -heart failure -hepatitis or liver disease -immune system problems -infection -lung or breathing disease, like COPD -multiple sclerosis -current or past resident of [place] or [place] -seizure disorder -an unusual or allergic reaction to infliximab, mouse proteins, other medicines, foods, dyes, or preservatives -pregnant or trying to get pregnant -breast-feeding How should I use this medicine? This medicine is for injection into a vein. It is usually given by a health care professional in a hospital or clinic setting. A special MedGuide will be given to you by the pharmacist with each prescription and refill. Be sure to read this information carefully each time. Talk to your pediatrician regarding the use of this medicine in children. Special care may be needed. Overdosage: If you think you have taken too much of this medicine contact a poison control center or emergency room at once. NOTE: This medicine is only for you. Do not share this medicine with others. What if I miss a dose? It is important not to miss your dose. Call your doctor or health care professional if you are unable to keep an appointment. What may interact with this medicine? Do not take this medicine with any of the following medications: -anakinra -rilonacept This medicine may also interact with the following medications: -vaccines This list may not describe all possible interactions. Give your health care provider  a list of all the medicines, herbs, non-prescription drugs, or dietary supplements you use. Also tell them if you smoke, drink alcohol, or use illegal drugs. Some items may interact with your medicine. What should I watch for while using this medicine? Visit your doctor or health care professional for regular checks on your progress. If you get a cold or other infection while receiving this medicine, call your doctor or health care professional. Do not treat yourself. This medicine may decrease your body's ability to fight infections. Before beginning therapy, your doctor may do a test to see if you have been exposed to tuberculosis. This medicine may make the symptoms of heart failure worse in some patients. If you notice symptoms such as increased shortness of breath or swelling of the ankles or legs, contact your health care provider right away. If you are going to have surgery or dental work, tell your health care professional or dentist that you have received this medicine. If you take this medicine for plaque psoriasis, stay out of the sun. If you cannot avoid being in the sun, wear protective clothing and use sunscreen. Do not use sun lamps or tanning beds/booths. What side effects may I notice from receiving this medicine? Side effects that you should report to your doctor or health care professional as soon as possible: -allergic reactions like skin rash, itching or hives, swelling of the face, lips, or tongue -chest pain -fever or chills, usually related to the infusion -muscle or joint pain -red, scaly patches or raised bumps on the skin -signs of infection - fever or chills, cough, sore throat, pain or difficulty passing urine -swollen lymph nodes in the neck, underarm,  or groin areas -unexplained weight loss -unusual bleeding or bruising -unusually weak or tired -yellowing of the eyes or skin Side effects that usually do not require medical attention (report to your doctor or health  care professional if they continue or are bothersome): -headache -heartburn or stomach pain -nausea, vomiting This list may not describe all possible side effects. Call your doctor for medical advice about side effects. You may report side effects to FDA at 1-800-FDA-1088. Where should I keep my medicine? This drug is given in a hospital or clinic and will not be stored at home. NOTE: This sheet is a summary. It may not cover all possible information. If you have questions about this medicine, talk to your doctor, pharmacist, or health care provider.  2012, [employer]vier/Gold Standard. (04/10/2008 10:26:02 AM)

## 2012-01-25 ENCOUNTER — Other Ambulatory Visit: Payer: Self-pay | Admitting: *Deleted

## 2012-01-25 NOTE — Telephone Encounter (Signed)
Pt states she is seeing Doctor Pearline Cables at [facility] due to her living off of a fixed income. Pt states she is getting her Lopressor from Dr. Pearline Cables office and would only come back to Dr. Acie Fredrickson if she really feel like she need to come back. Spoke with Theadora Rama at [facility] and they are aware of the switch.

## 2012-01-27 ENCOUNTER — Other Ambulatory Visit: Payer: Self-pay | Admitting: Internal Medicine

## 2012-01-27 ENCOUNTER — Other Ambulatory Visit: Payer: Self-pay | Admitting: *Deleted

## 2012-01-27 MED ORDER — ALPRAZOLAM 0.5 MG PO TABS: 0.5000 mg | ORAL_TABLET | Freq: Three times a day (TID) | ORAL | Status: DC | PRN

## 2012-02-08 ENCOUNTER — Encounter: Payer: Self-pay | Admitting: Cardiovascular Disease

## 2012-02-09 ENCOUNTER — Encounter: Payer: Self-pay | Admitting: Cardiovascular Disease

## 2012-03-01 ENCOUNTER — Other Ambulatory Visit: Payer: Self-pay | Admitting: Internal Medicine

## 2012-03-01 ENCOUNTER — Other Ambulatory Visit: Payer: Self-pay | Admitting: Gynecology

## 2012-03-01 DIAGNOSIS — N83209 Unspecified ovarian cyst, unspecified side: Secondary | ICD-10-CM

## 2012-03-01 DIAGNOSIS — N8300 Follicular cyst of ovary, unspecified side: Secondary | ICD-10-CM

## 2012-03-13 ENCOUNTER — Other Ambulatory Visit: Payer: Self-pay | Admitting: Internal Medicine

## 2012-03-13 DIAGNOSIS — K5190 Ulcerative colitis, unspecified, without complications: Secondary | ICD-10-CM

## 2012-03-15 ENCOUNTER — Ambulatory Visit (INDEPENDENT_AMBULATORY_CARE_PROVIDER_SITE_OTHER): Payer: Medicare Other

## 2012-03-15 ENCOUNTER — Ambulatory Visit (INDEPENDENT_AMBULATORY_CARE_PROVIDER_SITE_OTHER): Payer: Medicare Other | Admitting: Gynecology

## 2012-03-15 ENCOUNTER — Encounter: Payer: Self-pay | Admitting: Gynecology

## 2012-03-15 VITALS — BP 110/70

## 2012-03-15 DIAGNOSIS — N83209 Unspecified ovarian cyst, unspecified side: Secondary | ICD-10-CM

## 2012-03-15 DIAGNOSIS — N83201 Unspecified ovarian cyst, right side: Secondary | ICD-10-CM

## 2012-03-15 DIAGNOSIS — N8300 Follicular cyst of ovary, unspecified side: Secondary | ICD-10-CM

## 2012-03-15 DIAGNOSIS — N831 Corpus luteum cyst of ovary, unspecified side: Secondary | ICD-10-CM

## 2012-03-15 NOTE — Progress Notes (Addendum)
Patient is a 56 year old who presented to the office today for followup on a right ovarian cyst. Patient with prior history of hysterectomy and left salpingo-oophorectomy. Patient with past history of Crohn's disease has been followed by Dr. Maurene Capes and general surgeon Dr. Zella Richer. See previous note dated 09/08/2011 for additional details. Patient did receive one dose of Depo-Provera 150 mg IM. Previous ultrasound report as follows:  06/07/2011: Ultrasound report demonstrated absence of the uterus due to the fact that she had a prior hysterectomy and left salpingoophorectomy. Right ovary with a thin wall cystic mass, with scattered internal low level echoes irregular shaped cyst approximately 3.7 x 3.8 x 2.5 cm with positive color flow in the wall of the cyst. Thick walled follicle 9 x 7 mm with cystic lumen negative color flow was noted along with solid cystic area 9 x 8 mm with microcalcification the wall of the cyst negative color flow negative fluid in the cul-de-sac.  She had a normal CA 125 with a value 7.3. She received Depo-Provera 150 mg IM and returned today for followup ultrasound with the following findings: Absent uterus and left adnexa. Left adnexa negative. Right ovary with thick wall echogenic wall of follicle measured 9 x 6 mm. Inferior to the wall of the ovary thinwall echo-free avascular cyst measuring 3.5 x 2.4 x 3.7 mm average size 32 mm.  Patient is doing well is asymptomatic with the exception of the flare up from her Crohn's disease at times.Patient with history of bowel resection and currently has a J-pouch. . Patient had a flexible sigmoidoscopy by Dr. Maurene Capes which demonstrated inflammation of the pouch/anal anastomoses which were biopsied and has been followed by the above providers we'll continue to followup with an ultrasound in 6 months. The risk outweighed the benefits for any type of exploratory surgery for this benign appearing cyst or possible hydrosalpinx.Dr. Zella Richer had  recommended that patient be followed by colorectal surgeon in case the pouch needs repair and at that time a GYN consultation can be made to look at her remaining right ovary.Patient fully understands and agrees all questions were answered we'll follow accordingly.

## 2012-03-16 ENCOUNTER — Encounter (HOSPITAL_COMMUNITY)
Admission: RE | Admit: 2012-03-16 | Discharge: 2012-03-16 | Disposition: A | Discharge status: Home / Self Care | Payer: Medicare Other | Source: Ambulatory Visit | Attending: Internal Medicine | Admitting: Internal Medicine

## 2012-03-16 VITALS — BP 127/82 | HR 84 | Temp 97.3°F | Resp 18 | Wt 161.5 lb

## 2012-03-16 DIAGNOSIS — K5190 Ulcerative colitis, unspecified, without complications: Secondary | ICD-10-CM | POA: Insufficient documentation

## 2012-03-16 MED ORDER — DIPHENHYDRAMINE HCL 25 MG PO TABS
25.0000 mg | ORAL_TABLET | ORAL | Status: DC
  Administered 2012-03-16: 25 mg via ORAL
  Filled 2012-03-16: qty 1

## 2012-03-16 MED ORDER — ACETAMINOPHEN 325 MG PO TABS
ORAL_TABLET | ORAL | Status: AC
  Administered 2012-03-16: 650 mg via ORAL
  Filled 2012-03-16: qty 2

## 2012-03-16 MED ORDER — SODIUM CHLORIDE 0.9 % IV SOLN
INTRAVENOUS | Status: AC
  Administered 2012-03-16: 09:00:00 via INTRAVENOUS

## 2012-03-16 MED ORDER — INFLIXIMAB 100 MG IV SOLR
5.0000 mg/kg | INTRAVENOUS | Status: DC
  Filled 2012-03-16: qty 40

## 2012-03-16 MED ORDER — DIPHENHYDRAMINE HCL 25 MG PO CAPS
ORAL_CAPSULE | ORAL | Status: AC
  Administered 2012-03-16: 25 mg via ORAL
  Filled 2012-03-16: qty 1

## 2012-03-16 MED ORDER — SODIUM CHLORIDE 0.9 % IV SOLN
5.0000 mg/kg | INTRAVENOUS | Status: DC
  Administered 2012-03-16: 400 mg via INTRAVENOUS
  Filled 2012-03-16: qty 40

## 2012-03-16 MED ORDER — ACETAMINOPHEN 325 MG PO TABS
650.0000 mg | ORAL_TABLET | ORAL | Status: DC
  Administered 2012-03-16: 650 mg via ORAL

## 2012-04-23 ENCOUNTER — Other Ambulatory Visit: Payer: Self-pay | Admitting: Internal Medicine

## 2012-04-24 ENCOUNTER — Ambulatory Visit (INDEPENDENT_AMBULATORY_CARE_PROVIDER_SITE_OTHER): Payer: Medicare Other | Admitting: Nurse Practitioner

## 2012-04-24 ENCOUNTER — Telehealth: Payer: Self-pay | Admitting: *Deleted

## 2012-04-24 ENCOUNTER — Encounter: Payer: Self-pay | Admitting: Nurse Practitioner

## 2012-04-24 VITALS — BP 124/82 | HR 85 | Ht 64.0 in | Wt 165.8 lb

## 2012-04-24 DIAGNOSIS — E785 Hyperlipidemia, unspecified: Secondary | ICD-10-CM

## 2012-04-24 DIAGNOSIS — I10 Essential (primary) hypertension: Secondary | ICD-10-CM

## 2012-04-24 MED ORDER — ALPRAZOLAM 0.5 MG PO TABS: 0.5000 mg | ORAL_TABLET | Freq: Three times a day (TID) | ORAL | Status: DC | PRN

## 2012-04-24 MED ORDER — METOPROLOL SUCCINATE ER 100 MG PO TB24: ORAL_TABLET | ORAL | Status: DC

## 2012-04-24 NOTE — Progress Notes (Signed)
[name] Date of Birth: 09/19/1955 Medical Record [id number]  History of Present Illness: [name] is seen today for a follow up visit. She is seen for [name]. She has not been seen in about 2 years. She has HTN, ulcerative colitis with extensive GI issues, HLD and chest pain. Remote Myoview back in 2008 was satisfactory.   She comes in today. She is here alone. She is doing well. She says her chest pain is unchanged over the last several years and really nothing that worries her. She has no exertional symptoms. She is not short of breath. She needs her Metoprolol refilled. No shortness of breath. Not as active and her weight is up. She continues to have significant GI issues. Still with some stool leakage and has an internal J pouch. She sees nephrololgy for an elevated creatinine and is due to have her physical soon. She really has no complaint and thinks she is doing well.   Current Outpatient Prescriptions on File Prior to Visit  Medication Sig Dispense Refill  . acetaminophen (TYLENOL) 325 MG tablet Give 650 mg po every 8 weeks prior to Remicade infusion  2 tablet  0  . BIOTIN 5000 PO Take by mouth daily.        . ciprofloxacin (CIPRO) 250 MG tablet TAKE ONE TABLET BY MOUTH EVERY DAY  30 tablet  0  . diphenhydrAMINE (BENADRYL) 25 mg capsule Give one po every 8 weeks prior to Remicade infustion  1 capsule  0  . diphenoxylate-atropine (LOMOTIL) 2.5-0.025 MG per tablet Take 1 tablet by mouth 3 (three) times daily as needed.        . DULoxetine (CYMBALTA) 60 MG capsule Take 2 tablets by mouth once daily  180 capsule  3  . HYDROcodone-acetaminophen (VICODIN) 5-500 MG per tablet Take 1 tablet by mouth every 4-6 hours as needed for pain. Must last 30 days  40 tablet  1  . hydrocortisone (ANUSOL-HC) 2.5 % rectal cream Place 1 application rectally as needed.       . inFLIXimab (REMICADE) 100 MG injection Infuse 5 mg/kg IV at 0 weeks, 2 weeks, 6 weeks and then every 8 weeks thereafter.  Give Benadryl 25 mg 1 tablet by mouth and/or acetaminophen 650 mg by mouth before each infusion. Also add Ativan 0.5 mg 1 tablet by mouth before infusions. TB test: 06/09/11 Negative (0 mm) Ht: 5'4'' Wt: 157 pounds  Dx: Ulcerative Colitis  3 each  0  . metoprolol succinate (TOPROL-XL) 100 MG 24 hr tablet Take 0.5 Tablet Daily  30 tablet  0  . pantoprazole (PROTONIX) 40 MG tablet TAKE ONE TABLET BY MOUTH EVERY DAY  90 tablet  0  . DISCONTD: mesalamine (CANASA) 1000 MG suppository Insert one rectally BID  60 suppository  2    Allergies  Allergen Reactions  . Morphine   . Sulfonamide Derivatives     Past Medical History  Diagnosis Date  . History of small bowel obstruction   . Obesity   . Hyperparathyroidism   . Hypokalemia   . Hypertension   . Gastric polyp   . GERD (gastroesophageal reflux disease)   . Depression   . Anxiety   . Anemia   . Renal insufficiency   . Ulcerative colitis   . RLS (restless legs syndrome)   . Pulmonary nodule, [name]     [name] upper lobe  . Anal stenosis   . Esophagitis   . De Quervain's tenosynovitis   . Hyperlipidemia   .  Fibroid   . History of cervical dysplasia   . Asthma   . Hearing loss   . Visual disturbance   . Abdominal pain   . Rectal bleeding   . Blood in stool   . Rectal pain   . Difficulty urinating   . Blood in urine   . Fainting     due to dehydration  . Weakness generalized   . Fatigue   . Easy bruising   . Hemorrhoids, internal   . Hemorrhoids, external   . Ovarian cyst     Past Surgical History  Procedure Date  . Anal dilation   . Restorative proctocolectomy     with insertion of ileoanal J Pouch with loop ileostomy  . Ileostomy closure   . Cholecystectomy   . Tubal ligation   . Hemorrhoid surgery   . Fatty tumor removed from back   . Total abdominal hysterectomy 1998    TAH/LSO  . Cervical biopsy  w/ loop electrode excision   . Upper gastrointestinal endoscopy     History  Smoking status  . Former  Smoker  . Types: Cigarettes  . Quit date: 05/21/1979  Smokeless tobacco  . Never Used  Comment: quit age 55    History  Alcohol Use No    Family History  Problem Relation Age of Onset  . Heart disease Father   . Ulcerative colitis Father   . Hypertension Father   . Ulcerative colitis Daughter   . Irritable bowel syndrome      grandchildren  . Colon cancer Neg Hx   . Hypertension Mother   . Heart disease Mother   . Diabetes Sister   . Cancer Sister     uterine  . Cancer Maternal Uncle     LUNG    Review of Systems: The review of systems is per the HPI.  All other systems were reviewed and are negative.  Physical Exam: BP 124/82  Pulse 85  Ht 5\' 4"  (1.626 m)  Wt 165 lb 12.8 oz (75.206 kg)  BMI 28.46 kg/m2 Patient is very pleasant and in no acute distress. She is obese. Skin is warm and dry. Color is normal.  HEENT is unremarkable. Normocephalic/atraumatic. PERRL. Sclera are nonicteric. Neck is supple. No masses. No JVD. Lungs are clear. Cardiac exam shows a regular rate and rhythm. Abdomen is soft. Extremities are without edema. Gait and ROM are intact. No gross neurologic deficits noted.  LABORATORY DATA: EKG today shows sinus rhythm. She has poor R wave progression.   Assessment / Plan:

## 2012-04-24 NOTE — Telephone Encounter (Signed)
rx sent

## 2012-04-24 NOTE — Patient Instructions (Signed)
I think you are doing ok.   We will see you in a year.  I have refilled your Metoprolol.  Stay active!  Call the [facility] office at [phone] if you have any questions, problems or concerns.

## 2012-04-24 NOTE — Assessment & Plan Note (Signed)
She is felt to be doing well from our standpoint. Her Metoprolol is refilled today. I have encouraged risk factor modification. Most of her issues are GI related. We will see her back in one year. Patient is agreeable to this plan and will call if any problems develop in the interim.

## 2012-04-25 ENCOUNTER — Telehealth: Payer: Self-pay | Admitting: Internal Medicine

## 2012-04-25 NOTE — Telephone Encounter (Signed)
Gave verbal order to pharmacist since they did not get script for alprazolam yesterday.

## 2012-05-11 ENCOUNTER — Encounter (HOSPITAL_COMMUNITY): Payer: Self-pay

## 2012-05-11 ENCOUNTER — Encounter (HOSPITAL_COMMUNITY)
Admission: RE | Admit: 2012-05-11 | Discharge: 2012-05-11 | Disposition: A | Discharge status: Home / Self Care | Payer: Medicare Other | Source: Ambulatory Visit | Attending: Internal Medicine | Admitting: Internal Medicine

## 2012-05-11 VITALS — BP 125/78 | HR 84 | Temp 98.1°F | Resp 18 | Ht 64.0 in | Wt 164.0 lb

## 2012-05-11 DIAGNOSIS — K5190 Ulcerative colitis, unspecified, without complications: Secondary | ICD-10-CM | POA: Insufficient documentation

## 2012-05-11 MED ORDER — ACETAMINOPHEN 325 MG PO TABS
650.0000 mg | ORAL_TABLET | ORAL | Status: DC
  Administered 2012-05-11: 650 mg via ORAL
  Filled 2012-05-11: qty 2

## 2012-05-11 MED ORDER — DIPHENHYDRAMINE HCL 25 MG PO CAPS
25.0000 mg | ORAL_CAPSULE | ORAL | Status: DC
  Administered 2012-05-11: 25 mg via ORAL
  Filled 2012-05-11 (×2): qty 1

## 2012-05-11 MED ORDER — SODIUM CHLORIDE 0.9 % IV SOLN
Freq: Once | INTRAVENOUS | Status: AC
  Administered 2012-05-11: 250 mL via INTRAVENOUS

## 2012-05-11 MED ORDER — SODIUM CHLORIDE 0.9 % IV SOLN
5.0000 mg/kg | INTRAVENOUS | Status: DC
  Administered 2012-05-11: 400 mg via INTRAVENOUS
  Filled 2012-05-11: qty 40

## 2012-05-25 ENCOUNTER — Other Ambulatory Visit: Payer: Self-pay | Admitting: Internal Medicine

## 2012-06-13 ENCOUNTER — Encounter: Payer: Self-pay | Admitting: Internal Medicine

## 2012-06-13 ENCOUNTER — Ambulatory Visit (INDEPENDENT_AMBULATORY_CARE_PROVIDER_SITE_OTHER): Payer: Medicare Other | Admitting: Internal Medicine

## 2012-06-13 VITALS — BP 106/72 | HR 68 | Ht 64.0 in | Wt 162.0 lb

## 2012-06-13 DIAGNOSIS — K91850 Pouchitis: Secondary | ICD-10-CM

## 2012-06-13 DIAGNOSIS — Z111 Encounter for screening for respiratory tuberculosis: Secondary | ICD-10-CM

## 2012-06-13 DIAGNOSIS — Z79899 Other long term (current) drug therapy: Secondary | ICD-10-CM

## 2012-06-13 DIAGNOSIS — K5190 Ulcerative colitis, unspecified, without complications: Secondary | ICD-10-CM

## 2012-06-13 MED ORDER — MESALAMINE 1000 MG RE SUPP: 1000.0000 mg | Freq: Every day | RECTAL | Status: DC

## 2012-06-13 MED ORDER — HYDROCODONE-ACETAMINOPHEN 5-500 MG PO TABS: 1.0000 | ORAL_TABLET | Freq: Four times a day (QID) | ORAL | Status: DC | PRN

## 2012-06-13 MED ORDER — HYDROCORTISONE ACE-PRAMOXINE 2.5-1 % RE CREA: TOPICAL_CREAM | Freq: Three times a day (TID) | RECTAL | Status: DC

## 2012-06-13 NOTE — Patient Instructions (Addendum)
We have sent the following medications to your pharmacy for you to pick up at your convenience: Analpram 2.5 % cream Canasa Hydrocodone We have given you a TB skin test today. Please make certain to come back to the office for a reading between 48-72 hours from now to avoid requiring repeat testing. CC: Dr Garnet Koyanagi

## 2012-06-13 NOTE — Progress Notes (Signed)
[name] April 29, 1956 MRN [id number]   History of Present Illness:  This is a 56 year old white female with ulcerative colitis who is status post total colectomy and ileoproctostomy in 1998. An initial diagnosis was made in the 1990s. Her last flexible sigmoidoscopy in September 2012 showed active colitis at the anastomosis at 20 cm. She started on Remicade infusions 5 mg/kg every 8 weeks in November 2012 and she has had marked improvement in her overall symptoms. She is still having loose stools but overall feels much better. She had a prior cholecystectomy and total abdominal hysterectomy. She has occasional left lower quadrant abdominal pain attributed to adhesions. She has a history of anemia. Her last hemoglobin was 12.6 with a hematocrit of 36.7. She has chronic renal insufficiency followed by Dr. Corliss Blacker. 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   Past Medical History  Diagnosis Date  . History of small bowel obstruction   . Obesity   . Hyperparathyroidism   . Hypokalemia   . Hypertension   . Gastric polyp   . GERD (gastroesophageal reflux disease)   . Depression   . Anxiety   . Anemia   . Renal insufficiency   . Ulcerative colitis   . RLS (restless legs syndrome)   . Pulmonary nodule, right     right upper lobe  . Anal stenosis   . Esophagitis   . De Quervain's tenosynovitis   . Hyperlipidemia   . Fibroid   . History of cervical dysplasia   . Asthma   . Hearing loss   . Visual disturbance   . Abdominal pain   . Rectal bleeding   . Blood in stool   . Rectal pain   . Difficulty urinating   . Blood in urine   . Fainting     due to dehydration  . Weakness generalized   . Fatigue   . Easy bruising   . Hemorrhoids, internal   . Hemorrhoids, external   . Ovarian cyst    Past Surgical History  Procedure Date  . Anal dilation   . Restorative proctocolectomy     with insertion of ileoanal J Pouch with loop ileostomy    . Ileostomy closure   . Cholecystectomy   . Tubal ligation   . Hemorrhoid surgery   . Fatty tumor removed from back   . Total abdominal hysterectomy 1998    TAH/LSO  . Cervical biopsy  w/ loop electrode excision   . Upper gastrointestinal endoscopy     reports that she quit smoking about 33 years ago. Her smoking use included Cigarettes. She has never used smokeless tobacco. She reports that she does not drink alcohol or use illicit drugs. family history includes Cancer in her maternal uncle and sister; Diabetes in her sister; Heart disease in her father and mother; Hypertension in her father and mother; Irritable bowel syndrome in an unspecified family member; and Ulcerative colitis in her daughter and father.  There is no history of Colon cancer. Allergies  Allergen Reactions  . Morphine   . Sulfonamide Derivatives         Review of Systems: Denies heartburn nausea vomiting.  The remainder of the 10 point ROS is negative except as outlined in H&P   Physical Exam: General appearance  Well developed, in no distress. Eyes- non icteric. HEENT nontraumatic, normocephalic. Mouth no lesions, tongue papillated, no cheilosis. Neck supple without adenopathy, thyroid not enlarged, no carotid bruits, no JVD.  Lungs Clear to auscultation bilaterally. Cor normal S1, normal S2, regular rhythm, no murmur,  quiet precordium. Abdomen: Soft relaxed abdomen with tenderness in her left lower quadrant. Well-healed surgical scars. Right lower quadrant unremarkable. Rectal: Not done. Extremities no pedal edema. Skin no lesions. Neurological alert and oriented x 3. Psychological normal mood and affect.  Assessment and Plan:   Problem #1 Status post subtotal colectomy and ileoproctostomy for ulcerative colitis. Patient is doing well on Remicade infusions every 8 weeks. We will plan to continue the infusions and refill her Canasa suppositories and Analpram cream for pouchitis. We will also refill  her hydrocodone. She is up-to-date on her labs. She is to continue Cipro 250 mg daily x 2 weeks.  06/13/2012 [name]

## 2012-06-16 ENCOUNTER — Other Ambulatory Visit: Payer: Self-pay | Admitting: Internal Medicine

## 2012-06-16 LAB — TB SKIN TEST
Induration: 0 mm
TB Skin Test: NEGATIVE

## 2012-06-16 NOTE — Telephone Encounter (Signed)
Pharmacy received 2 out of 3 medications. I gave a verbal order for patient's vicodin to the pharmacist.

## 2012-06-17 ENCOUNTER — Encounter: Payer: Self-pay | Admitting: Family Medicine

## 2012-06-17 DIAGNOSIS — N184 Chronic kidney disease, stage 4 (severe): Secondary | ICD-10-CM | POA: Insufficient documentation

## 2012-06-23 ENCOUNTER — Encounter: Payer: Self-pay | Admitting: Women's Health

## 2012-06-23 ENCOUNTER — Ambulatory Visit (INDEPENDENT_AMBULATORY_CARE_PROVIDER_SITE_OTHER): Payer: Medicare Other | Admitting: Women's Health

## 2012-06-23 ENCOUNTER — Telehealth: Payer: Self-pay | Admitting: Internal Medicine

## 2012-06-23 DIAGNOSIS — N898 Other specified noninflammatory disorders of vagina: Secondary | ICD-10-CM

## 2012-06-23 LAB — WET PREP FOR TRICH, YEAST, CLUE
Clue Cells Wet Prep HPF POC: NONE SEEN
Trich, Wet Prep: NONE SEEN
Yeast Wet Prep HPF POC: NONE SEEN

## 2012-06-23 MED ORDER — FLUCONAZOLE 150 MG PO TABS: 150.0000 mg | ORAL_TABLET | Freq: Once | ORAL | Status: DC

## 2012-06-23 NOTE — Progress Notes (Signed)
Patient ID: [name], female   DOB: Oct 14, 1955, 56 y.o.   MRN: [id number] Presents with the complaint of vaginal sores that started several days ago. Same partner years. History of hysterectomy with LSO. Denies history of herpes. Long-term problems with diarrhea and intestinal incontinence due to Crohn's and ulcerative colitis. Dr. Olevia Perches manages. Wears a pad always.   Exam: External genitalia extremely erythematous extending from mons pubis to the rectum. Numerous small superficial open areas near introitus. HSV culture taken. Wet prep done with a Q-tip. Wet prep negative.  Questionable HSV versus skin irritation due to incontinence  Plan: Aware of need to keep clean and dry, reviewed irritation may be exacerbated by the pads. Reviewed using an emoilent to help protect skin. Has received ointments to be used only in the rectal area. HSV culture pending.

## 2012-06-23 NOTE — Telephone Encounter (Signed)
Spoke with patient and she states her anal leakage has gotten into "other places" and she has had vaginal itching. She scratched it and now has some sores in the vagina. She states she has an appointment today at 11:00 AM with her GYN to see if it is a yeast infection. She will call us back if she needs [place].

## 2012-06-28 ENCOUNTER — Telehealth: Payer: Self-pay | Admitting: *Deleted

## 2012-06-28 ENCOUNTER — Encounter: Payer: Self-pay | Admitting: Gynecology

## 2012-06-28 DIAGNOSIS — A609 Anogenital herpesviral infection, unspecified: Secondary | ICD-10-CM

## 2012-06-28 MED ORDER — VALACYCLOVIR HCL 500 MG PO TABS: ORAL_TABLET | ORAL | Status: DC

## 2012-06-28 NOTE — Telephone Encounter (Signed)
Telephone call to review HSV culture results positive. States rash has resolved. Instructed to take Valtrex twice daily for 3-5 days if any further outbreaks.

## 2012-06-28 NOTE — Telephone Encounter (Signed)
Pt calling requesting HSV results from [place] 06/23/12. Please advise

## 2012-06-29 ENCOUNTER — Telehealth: Payer: Self-pay | Admitting: Internal Medicine

## 2012-06-29 NOTE — Telephone Encounter (Signed)
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  They have started her on acyclovir.  She is requesting refills on xanax and cipro.  According to the last office note you said cipro for 2 more weeks.  Please advise if ok to refill both or one of the meds?

## 2012-06-29 NOTE — Telephone Encounter (Signed)
Patient reports that

## 2012-06-30 NOTE — Telephone Encounter (Signed)
OK to refill both Xanax .25 mg, #30 , 1 po qd prn, 1 refill, Cipro 250 mg, #30, 1 po qd for pouchitis,

## 2012-07-03 MED ORDER — CIPROFLOXACIN HCL 250 MG PO TABS: 250.0000 mg | ORAL_TABLET | Freq: Every day | ORAL | Status: DC

## 2012-07-03 MED ORDER — ALPRAZOLAM 0.25 MG PO TABS: 0.2500 mg | ORAL_TABLET | Freq: Every day | ORAL | Status: DC | PRN

## 2012-07-03 NOTE — Telephone Encounter (Signed)
rx sent.  I have sent the rx to her pharmacy and left her a message they should be ready later

## 2012-07-05 ENCOUNTER — Ambulatory Visit (INDEPENDENT_AMBULATORY_CARE_PROVIDER_SITE_OTHER): Payer: Medicare Other | Admitting: Gynecology

## 2012-07-05 ENCOUNTER — Encounter: Payer: Self-pay | Admitting: Gynecology

## 2012-07-05 ENCOUNTER — Telehealth: Payer: Self-pay | Admitting: *Deleted

## 2012-07-05 ENCOUNTER — Other Ambulatory Visit: Payer: Self-pay | Admitting: *Deleted

## 2012-07-05 VITALS — BP 118/76 | Ht 64.0 in | Wt 164.0 lb

## 2012-07-05 DIAGNOSIS — Z01419 Encounter for gynecological examination (general) (routine) without abnormal findings: Secondary | ICD-10-CM

## 2012-07-05 DIAGNOSIS — N83209 Unspecified ovarian cyst, unspecified side: Secondary | ICD-10-CM

## 2012-07-05 DIAGNOSIS — K5190 Ulcerative colitis, unspecified, without complications: Secondary | ICD-10-CM

## 2012-07-05 DIAGNOSIS — Z862 Personal history of diseases of the blood and blood-forming organs and certain disorders involving the immune mechanism: Secondary | ICD-10-CM

## 2012-07-05 DIAGNOSIS — Z8639 Personal history of other endocrine, nutritional and metabolic disease: Secondary | ICD-10-CM

## 2012-07-05 DIAGNOSIS — N83201 Unspecified ovarian cyst, right side: Secondary | ICD-10-CM

## 2012-07-05 MED ORDER — MEDROXYPROGESTERONE ACETATE 150 MG/ML IM SUSP
150.0000 mg | Freq: Once | INTRAMUSCULAR | Status: AC
  Administered 2012-07-05: 150 mg via INTRAMUSCULAR

## 2012-07-05 NOTE — Telephone Encounter (Signed)
Patient had outbreak of vaginal herpes on 06/23/12. She states the sores are gone now. She is due for Remicade infusion on 07/06/12. Is she okay to have this? Please, advsise

## 2012-07-05 NOTE — Telephone Encounter (Signed)
OK to have remicade now.

## 2012-07-05 NOTE — Progress Notes (Addendum)
[name] 1956/01/26 [id number]   History:    56 y.o.  for annual gyn exam  with no major gynecological complaints today. Patient with extensive past medical history see problem list. Patient earlier this year had been evaluated for nephrolithiasis by her urologist for which patient spontaneously had passed a kidney stone. During her workup a CT scan that demonstrated right ovarian cyst that measured 2.8 cm. She had an ultrasound in our office October 1. Patient with prior history of total abdominal hysterectomy with left salpingo-oophorectomy the ultrasound had demonstrated a thin wall cyst with scattered internal low level echoes, irregular shaped cyst measuring 3.7 x 3.8 x 2.5 cm with positive color flow at the wall of this cyst. Patient had a normal CA 125. She had previously been given a shot of Depo-Provera 150 mg an effort to see this this would shrink. The ultrasound had demonstrated a cystic gotten smaller with very little echoes negative flow. She returned back in July 2013 for followup ultrasound and the cyst was 3.5 x 2.4 x 3.7 cm thinwall echo-free totally avascular.  Patient has had history of ulcerative colitis since 1990 and has been followed by Dr. Maurene Capes.She underwent a total colectomy and ileoproctostomy in 1998 for intractable disease. She has a history of pouchitis. Her last flexible sigmoidoscopy in September 2012 showed active inflammatory bowel disease at the anastomosis at 20 cm. She has been followed for renal insufficiency by Dr Servando Salina.  Her last mammogram was in January this year which was normal. She frequently does her self breast examination. Her last bone density study was in 2008. Her primary physician has been Dr. Garnet Koyanagi. Upon review of her problem list indicated she had hyperparathyroidism for which patient did not recall. In April 2013 her calcium level was normal.     Past medical history,surgical history, family history and social history were all  reviewed and documented in the EPIC chart.  Gynecologic History No LMP recorded. Patient has had a hysterectomy. Contraception: none and Hysterectomy Last Pap:  2012. Results were: normal Last mammogram:  2013. Results were: normal  Obstetric History OB History    Grav Para Term Preterm Abortions TAB SAB Ect Mult Living   4 2 2  2  2   2      # Outc Date GA Lbr Len/2nd Wgt Sex Del Anes PTL Lv   1 TRM     M SVD  No Yes   2 TRM     F SVD  No Yes   3 SAB            4 SAB                ROS: A ROS was performed and pertinent positives and negatives are included in the history.  GENERAL: No fevers or chills. HEENT: No change in vision, no earache, sore throat or sinus congestion. NECK: No pain or stiffness. CARDIOVASCULAR: No chest pain or pressure. No palpitations. PULMONARY: No shortness of breath, cough or wheeze. GASTROINTESTINAL: No abdominal pain, nausea, vomiting or diarrhea, melena or bright red blood per rectum. GENITOURINARY: No urinary frequency, urgency, hesitancy or dysuria. MUSCULOSKELETAL: No joint or muscle pain, no back pain, no recent trauma. DERMATOLOGIC: No rash, no itching, no lesions. ENDOCRINE: No polyuria, polydipsia, no heat or cold intolerance. No recent change in weight. HEMATOLOGICAL: No anemia or easy bruising or bleeding. NEUROLOGIC: No headache, seizures, numbness, tingling or weakness. PSYCHIATRIC: No depression, no loss of interest in normal activity  or change in sleep pattern.     Exam: chaperone present  BP 118/76  Ht 5' 4"  (1.626 m)  Wt 164 lb (74.39 kg)  BMI 28.15 kg/m2  Body mass index is 28.15 kg/(m^2).  General appearance : Well developed well nourished female. No acute distress HEENT: Neck supple, trachea midline, no carotid bruits, no thyroidmegaly Lungs: Clear to auscultation, no rhonchi or wheezes, or rib retractions  Heart: Regular rate and rhythm, no murmurs or gallops Breast:Examined in sitting and supine position were symmetrical in  appearance, no palpable masses or tenderness,  no skin retraction, no nipple inversion, no nipple discharge, no skin discoloration, no axillary or supraclavicular lymphadenopathy Abdomen: no palpable masses or tenderness, no rebound or guarding Extremities: no edema or skin discoloration or tenderness  Pelvic:  Bartholin, Urethra, Skene Glands: Within normal limits             Vagina: No gross lesions or discharge  Cervix:  absent   Uterus   absent   Adnexa  Without masses or tenderness  Anus and perineum  normal   Rectovaginal  normal sphincter tone without palpated masses or tenderness             Hemoccult  not done colonoscopy less than one year     Assessment/Plan:  56 y.o. female for annual exam  with extensive past medical history as noted above. Patient will continue to follow with Dr. Maurene Capes her gastroenterologist and with Dr.Rosenbower her general surgeon as well as her primary physician so no lab work will be drawn today with the exception of the PTH, calcium, and vitamin D. She will schedule a bone density study. She was given a shot of Depo-Provera 150 mg IM to see if this is to continue to shrink and avoid any surgical intervention because of her past surgical history. She will return back to the office in January for followup ultrasound and CA 125. We discussed the new Pap smear screening guidelines and since she has had a hysterectomy and over 20 years she has had no high risk abnormal Pap smears she will no longer needs Pap smears. Dr. Zella Richer had recommended that if the patient needed any pelvic surgery such as removal and ovarian cyst or ovary that had begun at the [facility] with concurrent consultation with colorectal surgeon. All these issues were discussed with the patient and we'll continue to monitor closely. Patient will also continue to followup with her nephrologist.     Terrance Mass MD, 1:46 PM 07/05/2012

## 2012-07-05 NOTE — Patient Instructions (Addendum)
Tetanus, Diphtheria, Pertussis (Tdap) Vaccine What You Need to Know WHY GET VACCINATED? Tetanus, diphtheria and pertussis can be very serious diseases, even for adolescents and adults. Tdap vaccine can protect us from these diseases. TETANUS (Lockjaw) causes painful muscle tightening and stiffness, usually all over the body.  It can lead to tightening of muscles in the head and neck so you can't open your mouth, swallow, or sometimes even breathe. Tetanus kills about 1 out of 5 people who are infected. DIPHTHERIA can cause a thick coating to form in the back of the throat.  It can lead to breathing problems, paralysis, heart failure, and death. PERTUSSIS (Whooping Cough) causes severe coughing spells, which can cause difficulty breathing, vomiting and disturbed sleep.  It can also lead to weight loss, incontinence, and rib fractures. Up to 2 in 100 adolescents and 5 in 100 adults with pertussis are hospitalized or have complications, which could include pneumonia and death. These diseases are caused by bacteria. Diphtheria and pertussis are spread from person to person through coughing or sneezing. Tetanus enters the body through cuts, scratches, or wounds. Before vaccines, the [place] saw as many as 200,000 cases a year of diphtheria and pertussis, and hundreds of cases of tetanus. Since vaccination began, tetanus and diphtheria have dropped by about 99% and pertussis by about 80%. TDAP VACCINE Tdap vaccine can protect adolescents and adults from tetanus, diphtheria, and pertussis. One dose of Tdap is routinely given at age 11 or 12. People who did not get Tdap at that age should get it as soon as possible. Tdap is especially important for health care professionals and anyone having close contact with a baby younger than 12 months. Pregnant women should get a dose of Tdap during every pregnancy, to protect the newborn from pertussis. Infants are most at risk for severe, life-threatening  complications from pertussis. A similar vaccine, called Td, protects from tetanus and diphtheria, but not pertussis. A Td booster should be given every 10 years. Tdap may be given as one of these boosters if you have not already gotten a dose. Tdap may also be given after a severe cut or burn to prevent tetanus infection. Your doctor can give you more information. Tdap may safely be given at the same time as other vaccines. SOME PEOPLE SHOULD NOT GET THIS VACCINE  If you ever had a life-threatening allergic reaction after a dose of any tetanus, diphtheria, or pertussis containing vaccine, OR if you have a severe allergy to any part of this vaccine, you should not get Tdap. Tell your doctor if you have any severe allergies.  If you had a coma, or long or multiple seizures within 7 days after a childhood dose of DTP or DTaP, you should not get Tdap, unless a cause other than the vaccine was found. You can still get Td.  Talk to your doctor if you:  have epilepsy or another nervous system problem,  had severe pain or swelling after any vaccine containing diphtheria, tetanus or pertussis,  ever had Guillain-Barr Syndrome (GBS),  aren't feeling well on the day the shot is scheduled. RISKS OF A VACCINE REACTION With any medicine, including vaccines, there is a chance of side effects. These are usually mild and go away on their own, but serious reactions are also possible. Brief fainting spells can follow a vaccination, leading to injuries from falling. Sitting or lying down for about 15 minutes can help prevent these. Tell your doctor if you feel dizzy or light-headed, or   have vision changes or ringing in the ears. Mild problems following Tdap (Did not interfere with activities)  Pain where the shot was given (about 3 in 4 adolescents or 2 in 3 adults)  Redness or swelling where the shot was given (about 1 person in 5)  Mild fever of at least 100.4F (up to about 1 in 25 adolescents or 1 in  100 adults)  Headache (about 3 or 4 people in 10)  Tiredness (about 1 person in 3 or 4)  Nausea, vomiting, diarrhea, stomach ache (up to 1 in 4 adolescents or 1 in 10 adults)  Chills, body aches, sore joints, rash, swollen glands (uncommon) Moderate problems following Tdap (Interfered with activities, but did not require medical attention)  Pain where the shot was given (about 1 in 5 adolescents or 1 in 100 adults)  Redness or swelling where the shot was given (up to about 1 in 16 adolescents or 1 in 25 adults)  Fever over 102F (about 1 in 100 adolescents or 1 in 250 adults)  Headache (about 3 in 20 adolescents or 1 in 10 adults)  Nausea, vomiting, diarrhea, stomach ache (up to 1 or 3 people in 100)  Swelling of the entire arm where the shot was given (up to about 3 in 100). Severe problems following Tdap (Unable to perform usual activities, required medical attention)  Swelling, severe pain, bleeding and redness in the arm where the shot was given (rare). A severe allergic reaction could occur after any vaccine (estimated less than 1 in a million doses). WHAT IF THERE IS A SERIOUS REACTION? What should I look for?  Look for anything that concerns you, such as signs of a severe allergic reaction, very high fever, or behavior changes. Signs of a severe allergic reaction can include hives, swelling of the face and throat, difficulty breathing, a fast heartbeat, dizziness, and weakness. These would start a few minutes to a few hours after the vaccination. What should I do?  If you think it is a severe allergic reaction or other emergency that can't wait, call 9-1-1 or get the person to the nearest hospital. Otherwise, call your doctor.  Afterward, the reaction should be reported to the "Vaccine Adverse Event Reporting System" (VAERS). Your doctor might file this report, or you can do it yourself through the VAERS web site at www.vaers.hhs.gov, or by calling 1-800-822-7967. VAERS is  only for reporting reactions. They do not give medical advice.  THE NATIONAL VACCINE INJURY COMPENSATION PROGRAM The National Vaccine Injury Compensation Program (VICP) is a federal program that was created to compensate people who may have been injured by certain vaccines. Persons who believe they may have been injured by a vaccine can learn about the program and about filing a claim by calling 1-800-338-2382 or visiting the VICP website at www.hrsa.gov/vaccinecompensation. HOW CAN I LEARN MORE?  Ask your doctor.  Call your local or state health department.  Contact the Centers for Disease Control and Prevention (CDC):  Call 1-800-232-4636 or visit CDC's website at www.cdc.gov/vaccines CDC Tdap Vaccine VIS (01/13/12) Document Released: 02/22/2012 Document Reviewed: 02/22/2012 [employer] Patient Information 2013 [employer].  

## 2012-07-06 ENCOUNTER — Encounter (HOSPITAL_COMMUNITY): Payer: Self-pay

## 2012-07-06 ENCOUNTER — Encounter (HOSPITAL_COMMUNITY)
Admission: RE | Admit: 2012-07-06 | Discharge: 2012-07-06 | Disposition: A | Discharge status: Home / Self Care | Payer: Medicare Other | Source: Ambulatory Visit | Attending: Internal Medicine | Admitting: Internal Medicine

## 2012-07-06 VITALS — BP 136/86 | HR 80 | Temp 98.0°F | Resp 16 | Ht 64.0 in | Wt 159.2 lb

## 2012-07-06 DIAGNOSIS — K5190 Ulcerative colitis, unspecified, without complications: Secondary | ICD-10-CM | POA: Insufficient documentation

## 2012-07-06 HISTORY — DX: Herpesviral infection of urogenital system, unspecified: A60.00

## 2012-07-06 LAB — PTH, INTACT AND CALCIUM
Calcium, Total (PTH): 9.3 mg/dL (ref 8.4–10.5)
PTH: 110 pg/mL — ABNORMAL HIGH (ref 14.0–72.0)

## 2012-07-06 LAB — VITAMIN D 25 HYDROXY (VIT D DEFICIENCY, FRACTURES): Vit D, 25-Hydroxy: 35 ng/mL (ref 30–89)

## 2012-07-06 MED ORDER — DIPHENHYDRAMINE HCL 25 MG PO CAPS
25.0000 mg | ORAL_CAPSULE | ORAL | Status: DC
  Administered 2012-07-06: 25 mg via ORAL
  Filled 2012-07-06: qty 1

## 2012-07-06 MED ORDER — SODIUM CHLORIDE 0.9 % IV SOLN
5.0000 mg/kg | INTRAVENOUS | Status: DC
  Administered 2012-07-06: 400 mg via INTRAVENOUS
  Filled 2012-07-06: qty 40

## 2012-07-06 MED ORDER — SODIUM CHLORIDE 0.9 % IV SOLN
INTRAVENOUS | Status: DC
  Administered 2012-07-06: 09:00:00 via INTRAVENOUS

## 2012-07-06 MED ORDER — ACETAMINOPHEN 325 MG PO TABS
650.0000 mg | ORAL_TABLET | ORAL | Status: DC
  Administered 2012-07-06: 650 mg via ORAL
  Filled 2012-07-06: qty 2

## 2012-07-10 ENCOUNTER — Other Ambulatory Visit: Payer: Self-pay | Admitting: Gynecology

## 2012-07-10 DIAGNOSIS — E349 Endocrine disorder, unspecified: Secondary | ICD-10-CM

## 2012-08-09 ENCOUNTER — Other Ambulatory Visit: Payer: Self-pay | Admitting: Gynecology

## 2012-08-09 DIAGNOSIS — Z13820 Encounter for screening for osteoporosis: Secondary | ICD-10-CM

## 2012-08-09 DIAGNOSIS — E213 Hyperparathyroidism, unspecified: Secondary | ICD-10-CM

## 2012-08-31 ENCOUNTER — Encounter (HOSPITAL_COMMUNITY)
Admission: RE | Admit: 2012-08-31 | Discharge: 2012-08-31 | Disposition: A | Discharge status: Home / Self Care | Payer: Medicare Other | Source: Ambulatory Visit | Attending: Internal Medicine | Admitting: Internal Medicine

## 2012-08-31 VITALS — BP 138/88 | HR 100 | Temp 98.6°F | Resp 18 | Ht 64.0 in | Wt 165.2 lb

## 2012-08-31 DIAGNOSIS — K5190 Ulcerative colitis, unspecified, without complications: Secondary | ICD-10-CM

## 2012-08-31 MED ORDER — ACETAMINOPHEN 325 MG PO TABS
650.0000 mg | ORAL_TABLET | ORAL | Status: AC
  Administered 2012-08-31: 650 mg via ORAL
  Filled 2012-08-31: qty 2

## 2012-08-31 MED ORDER — SODIUM CHLORIDE 0.9 % IV SOLN: INTRAVENOUS | Status: DC

## 2012-08-31 MED ORDER — DIPHENHYDRAMINE HCL 25 MG PO CAPS
25.0000 mg | ORAL_CAPSULE | ORAL | Status: AC
  Administered 2012-08-31: 25 mg via ORAL
  Filled 2012-08-31: qty 1

## 2012-08-31 MED ORDER — SODIUM CHLORIDE 0.9 % IV SOLN
5.0000 mg/kg | INTRAVENOUS | Status: AC
  Administered 2012-08-31: 400 mg via INTRAVENOUS
  Filled 2012-08-31: qty 40

## 2012-09-01 ENCOUNTER — Encounter: Payer: Self-pay | Admitting: *Deleted

## 2012-09-01 ENCOUNTER — Other Ambulatory Visit: Payer: Self-pay | Admitting: Family Medicine

## 2012-09-01 ENCOUNTER — Other Ambulatory Visit: Payer: Self-pay | Admitting: *Deleted

## 2012-09-01 DIAGNOSIS — F329 Major depressive disorder, single episode, unspecified: Secondary | ICD-10-CM

## 2012-09-01 DIAGNOSIS — F32A Depression, unspecified: Secondary | ICD-10-CM

## 2012-09-01 MED ORDER — PANTOPRAZOLE SODIUM 40 MG PO TBEC: 40.0000 mg | DELAYED_RELEASE_TABLET | Freq: Every day | ORAL | Status: DC

## 2012-09-01 MED ORDER — ALPRAZOLAM 0.25 MG PO TABS: 0.2500 mg | ORAL_TABLET | Freq: Every day | ORAL | Status: DC | PRN

## 2012-09-12 ENCOUNTER — Other Ambulatory Visit: Payer: Self-pay | Admitting: Gynecology

## 2012-09-12 DIAGNOSIS — E213 Hyperparathyroidism, unspecified: Secondary | ICD-10-CM

## 2012-09-12 DIAGNOSIS — N83209 Unspecified ovarian cyst, unspecified side: Secondary | ICD-10-CM

## 2012-09-18 ENCOUNTER — Ambulatory Visit (INDEPENDENT_AMBULATORY_CARE_PROVIDER_SITE_OTHER): Payer: Medicare Other | Admitting: Gynecology

## 2012-09-18 ENCOUNTER — Encounter: Payer: Self-pay | Admitting: Gynecology

## 2012-09-18 ENCOUNTER — Ambulatory Visit (INDEPENDENT_AMBULATORY_CARE_PROVIDER_SITE_OTHER): Payer: Medicare Other

## 2012-09-18 VITALS — BP 120/72

## 2012-09-18 DIAGNOSIS — N83209 Unspecified ovarian cyst, unspecified side: Secondary | ICD-10-CM

## 2012-09-18 DIAGNOSIS — E213 Hyperparathyroidism, unspecified: Secondary | ICD-10-CM

## 2012-09-18 DIAGNOSIS — N949 Unspecified condition associated with female genital organs and menstrual cycle: Secondary | ICD-10-CM

## 2012-09-18 DIAGNOSIS — N83201 Unspecified ovarian cyst, right side: Secondary | ICD-10-CM

## 2012-09-18 MED ORDER — MEDROXYPROGESTERONE ACETATE 150 MG/ML IM SUSP
150.0000 mg | Freq: Once | INTRAMUSCULAR | Status: AC
  Administered 2012-09-18: 150 mg via INTRAMUSCULAR

## 2012-09-18 NOTE — Progress Notes (Signed)
Is a 57 year old who was seen for her annual gynecological examination 07/05/2012 patient that last year had been evaluated for nephrolithiasis by her urologist for which patient spontaneously had passed a kidney stone. During her workup a CT scan that demonstrated right ovarian cyst that measured 2.8 cm. She had an ultrasound in our office October 1. Patient with prior history of total abdominal hysterectomy with left salpingo-oophorectomy the ultrasound had demonstrated a thin wall cyst with scattered internal low level echoes, irregular shaped cyst measuring 3.7 x 3.8 x 2.5 cm with positive color flow at the wall of this cyst. Patient had a normal CA 125. She had previously been given a shot of Depo-Provera 150 mg an effort to see this this would shrink. The ultrasound had demonstrated that the cyst had gotten smaller with negative color flow.  Followup ultrasound July 2013 had a dimension 3.5 x 2.4 x 3.7 cm thin echo-free totally avascular.  Patient has had history of ulcerative colitis since 1990 and has been followed by Dr. Maurene Capes.She underwent a total colectomy and ileoproctostomy in 1998 for intractable disease. She has a history of pouchitis. Her last flexible sigmoidoscopy in September 2012 showed active inflammatory bowel disease at the anastomosis at 20 cm.  She has been followed for renal insufficiency by Dr Servando Salina.   She has received 2 shots of Depo-Provera and presented today for the ultrasound. Once again absence of the uterus and left tube and ovary. Right ovary thinwall echo-free cyst smaller in size now measuring 29 x 21 x 31 mm average size 2.7 cm. Totally avascular. A small follicle measured 6 x 4 mm was noted adjacent. Left adnexa otherwise negative.  Review of her record indicated that she is overdue for a bone density study it was done in another facility over 5 years ago. I do not have that report. When she was here for her annual exam on October 2013 we had obtained a calcium,  vitamin D and PTH level. Her PTH level was found to be elevated at 110 with normal calcium and vitamin D. She also has a history of normal C1 25 back in October 2012. A CA 1 25 along with a repeat her calcium vitamin D and PTH will be repeated today. On exam she had no thyroid masses palpated. I have discussed with the patient that if her PTH level returned back abnormally high that we will order a scan and followup consultation with  Endocrinologist. I will need to follow up with an ultrasound in 6 months. The appearance of the small cyst totally avascular with decreasing size responding to Depo-Provera and normal C1 25 is indicative that this is probably benign. The risk outweighed the benefits to operate on this patient because the above extensive medical surgical history. All this was discussed with the patient all questions were answered and we'll follow accordingly.

## 2012-09-18 NOTE — Patient Instructions (Addendum)
Remember to schedule mammogram

## 2012-09-19 LAB — PTH, INTACT AND CALCIUM
Calcium, Total (PTH): 9 mg/dL (ref 8.4–10.5)
PTH: 161.2 pg/mL — ABNORMAL HIGH (ref 14.0–72.0)

## 2012-09-19 LAB — CA 125: CA 125: 8.1 U/mL (ref 0.0–30.2)

## 2012-09-20 ENCOUNTER — Telehealth: Payer: Self-pay | Admitting: *Deleted

## 2012-09-20 DIAGNOSIS — E349 Endocrine disorder, unspecified: Secondary | ICD-10-CM

## 2012-09-20 NOTE — Telephone Encounter (Signed)
Order placed for scan referral placed for Dr.Balan referral as well. Dr.Balan office will faxed time and date to me then and I tell patient time and date.    Please notify patient that her CA 125 along with calcium and vitamin D were in the normal range but her PT H. (parathyroid hormone) has continued to increase it is now 161.2 (normal 14-72). Please schedule a thyroid/parathyroid scan with a followup consultation with Dr. Bubba Camp endocrinologist. I had discussed this with her at her last office visit

## 2012-09-28 ENCOUNTER — Telehealth: Payer: Self-pay

## 2012-09-28 NOTE — Telephone Encounter (Signed)
I called patient to let her know I have not yet received authorization from [facility] for her PTH scan scheduled in the morning. I recommended we r/s it a week or so out to allow time for notification. She was in agreement.  We r/s her for Monday Feb 3.  I did warn patient that we just this week had a patient with the same ins and same history have her PTH denied authorization by [facility].  I explained that if that happens Dr. Moshe Salisbury will send her to endocrinologist first for evaluation and patient informed me that Anderson Malta already has her an appt with Dr. Chalmers Cater per Dr. Moshe Salisbury.   I will call patient when I hear from [facility].

## 2012-09-28 NOTE — Telephone Encounter (Signed)
Appointment on Feb. 17 th @ 10:00 am

## 2012-09-28 NOTE — Telephone Encounter (Signed)
Pt informed

## 2012-09-29 ENCOUNTER — Encounter (HOSPITAL_COMMUNITY): Payer: Medicare Other

## 2012-10-05 ENCOUNTER — Telehealth: Payer: Self-pay

## 2012-10-05 NOTE — Telephone Encounter (Signed)
Patient informed that I received notification that the PTH Scan we had scheduled for her has been denied authorization of services by [facility].  I had spoken with patient previously and warned her that this might happen.  She is fine with cancelling it and going on to the appointment she has with Dr. Bubba Camp and see what she recommends.  Appt was cancelled with radiology.

## 2012-10-09 ENCOUNTER — Encounter (HOSPITAL_COMMUNITY): Payer: Medicare Other

## 2012-10-10 ENCOUNTER — Encounter: Payer: Self-pay | Admitting: Gynecology

## 2012-10-16 ENCOUNTER — Encounter (HOSPITAL_COMMUNITY)
Admission: RE | Admit: 2012-10-16 | Discharge: 2012-10-16 | Disposition: A | Discharge status: Home / Self Care | Payer: Medicare Other | Source: Ambulatory Visit | Attending: Internal Medicine | Admitting: Internal Medicine

## 2012-10-16 ENCOUNTER — Other Ambulatory Visit: Payer: Self-pay | Admitting: Gynecology

## 2012-10-16 DIAGNOSIS — K5190 Ulcerative colitis, unspecified, without complications: Secondary | ICD-10-CM | POA: Insufficient documentation

## 2012-10-16 DIAGNOSIS — E213 Hyperparathyroidism, unspecified: Secondary | ICD-10-CM

## 2012-10-16 DIAGNOSIS — Z13820 Encounter for screening for osteoporosis: Secondary | ICD-10-CM

## 2012-10-16 MED ORDER — SODIUM CHLORIDE 0.9 % IV SOLN
1020.0000 mg | Freq: Once | INTRAVENOUS | Status: AC
  Administered 2012-10-16: 1020 mg via INTRAVENOUS
  Filled 2012-10-16: qty 34

## 2012-10-25 ENCOUNTER — Other Ambulatory Visit: Payer: Self-pay | Admitting: *Deleted

## 2012-10-25 ENCOUNTER — Telehealth: Payer: Self-pay | Admitting: *Deleted

## 2012-10-25 DIAGNOSIS — K5190 Ulcerative colitis, unspecified, without complications: Secondary | ICD-10-CM

## 2012-10-25 NOTE — Telephone Encounter (Signed)
Last labs 05/19/2013 by renal. Please obtain CBC,C-met at the time of Remicade infusion. ----- Message ----- From: Hulan Saas, RN Sent: 10/24/2012 3:48 PM To: Lafayette Dragon, MD Dr. Olevia Perches, This patient is due for Remicade on 10/26/12. She last had labs on 12/28/11. Does she need any labs prior to Remicade? Abriel Hattery Orders faxed to [facility] short stay for labs.

## 2012-10-26 ENCOUNTER — Encounter (HOSPITAL_COMMUNITY): Payer: Self-pay

## 2012-10-26 ENCOUNTER — Encounter (HOSPITAL_COMMUNITY)
Admission: RE | Admit: 2012-10-26 | Discharge: 2012-10-26 | Disposition: A | Discharge status: Home / Self Care | Payer: Medicare Other | Source: Ambulatory Visit | Attending: Internal Medicine | Admitting: Internal Medicine

## 2012-10-26 VITALS — BP 119/71 | HR 104 | Temp 98.2°F | Resp 18 | Ht 64.0 in | Wt 167.0 lb

## 2012-10-26 DIAGNOSIS — K5190 Ulcerative colitis, unspecified, without complications: Secondary | ICD-10-CM

## 2012-10-26 LAB — CBC
HCT: 34.2 % — ABNORMAL LOW (ref 36.0–46.0)
Hemoglobin: 11.3 g/dL — ABNORMAL LOW (ref 12.0–15.0)
MCH: 31.1 pg (ref 26.0–34.0)
MCHC: 33 g/dL (ref 30.0–36.0)
MCV: 94.2 fL (ref 78.0–100.0)
Platelets: 379 10*3/uL (ref 150–400)
RBC: 3.63 MIL/uL — ABNORMAL LOW (ref 3.87–5.11)
RDW: 15 % (ref 11.5–15.5)
WBC: 9 10*3/uL (ref 4.0–10.5)

## 2012-10-26 LAB — COMPREHENSIVE METABOLIC PANEL
ALT: 19 U/L (ref 0–35)
AST: 16 U/L (ref 0–37)
Albumin: 3.2 g/dL — ABNORMAL LOW (ref 3.5–5.2)
Alkaline Phosphatase: 68 U/L (ref 39–117)
BUN: 31 mg/dL — ABNORMAL HIGH (ref 6–23)
CO2: 17 mEq/L — ABNORMAL LOW (ref 19–32)
Calcium: 8.9 mg/dL (ref 8.4–10.5)
Chloride: 108 mEq/L (ref 96–112)
Creatinine, Ser: 1.78 mg/dL — ABNORMAL HIGH (ref 0.50–1.10)
GFR calc Af Amer: 35 mL/min — ABNORMAL LOW (ref >90)
GFR calc non Af Amer: 30 mL/min — ABNORMAL LOW (ref >90)
Glucose, Bld: 96 mg/dL (ref 70–99)
Potassium: 3.7 mEq/L (ref 3.5–5.1)
Sodium: 137 mEq/L (ref 135–145)
Total Bilirubin: 0.2 mg/dL — ABNORMAL LOW (ref 0.3–1.2)
Total Protein: 7.6 g/dL (ref 6.0–8.3)

## 2012-10-26 MED ORDER — SODIUM CHLORIDE 0.9 % IV SOLN
INTRAVENOUS | Status: DC
  Administered 2012-10-26: 09:00:00 via INTRAVENOUS

## 2012-10-26 MED ORDER — DIPHENHYDRAMINE HCL 25 MG PO CAPS
25.0000 mg | ORAL_CAPSULE | ORAL | Status: DC
Start: 2012-10-26 — End: 2012-10-27
  Administered 2012-10-26: 25 mg via ORAL
  Filled 2012-10-26: qty 1

## 2012-10-26 MED ORDER — SODIUM CHLORIDE 0.9 % IV SOLN
5.0000 mg/kg | INTRAVENOUS | Status: DC
  Administered 2012-10-26: 400 mg via INTRAVENOUS
  Filled 2012-10-26: qty 40

## 2012-10-26 MED ORDER — ACETAMINOPHEN 325 MG PO TABS
650.0000 mg | ORAL_TABLET | ORAL | Status: DC
  Administered 2012-10-26: 650 mg via ORAL
  Filled 2012-10-26: qty 2

## 2012-10-27 ENCOUNTER — Other Ambulatory Visit: Payer: Self-pay | Admitting: *Deleted

## 2012-10-27 MED ORDER — HYDROCODONE-ACETAMINOPHEN 5-500 MG PO TABS: 1.0000 | ORAL_TABLET | Freq: Four times a day (QID) | ORAL | Status: DC | PRN

## 2012-11-17 ENCOUNTER — Other Ambulatory Visit: Payer: Self-pay | Admitting: *Deleted

## 2012-11-17 MED ORDER — ALPRAZOLAM 0.25 MG PO TABS: 0.2500 mg | ORAL_TABLET | Freq: Every day | ORAL | Status: DC | PRN

## 2012-12-21 ENCOUNTER — Encounter (HOSPITAL_COMMUNITY)
Admission: RE | Admit: 2012-12-21 | Discharge: 2012-12-21 | Disposition: A | Discharge status: Home / Self Care | Payer: Medicare Other | Source: Ambulatory Visit | Attending: Internal Medicine | Admitting: Internal Medicine

## 2012-12-21 ENCOUNTER — Encounter (HOSPITAL_COMMUNITY): Payer: Self-pay

## 2012-12-21 VITALS — BP 123/79 | HR 93 | Temp 98.8°F | Resp 20 | Ht 64.0 in | Wt 172.4 lb

## 2012-12-21 DIAGNOSIS — K5190 Ulcerative colitis, unspecified, without complications: Secondary | ICD-10-CM | POA: Insufficient documentation

## 2012-12-21 MED ORDER — ACETAMINOPHEN 325 MG PO TABS
650.0000 mg | ORAL_TABLET | ORAL | Status: DC
  Administered 2012-12-21: 650 mg via ORAL
  Filled 2012-12-21: qty 2

## 2012-12-21 MED ORDER — DIPHENHYDRAMINE HCL 25 MG PO CAPS
25.0000 mg | ORAL_CAPSULE | ORAL | Status: AC
  Administered 2012-12-21: 25 mg via ORAL
  Filled 2012-12-21: qty 1

## 2012-12-21 MED ORDER — SODIUM CHLORIDE 0.9 % IV SOLN
INTRAVENOUS | Status: AC
  Administered 2012-12-21: 20 mL/h via INTRAVENOUS

## 2012-12-21 MED ORDER — SODIUM CHLORIDE 0.9 % IV SOLN
5.0000 mg/kg | INTRAVENOUS | Status: AC
  Administered 2012-12-21: 400 mg via INTRAVENOUS
  Filled 2012-12-21: qty 40

## 2012-12-26 ENCOUNTER — Telehealth: Payer: Self-pay | Admitting: *Deleted

## 2012-12-26 NOTE — Telephone Encounter (Signed)
Spoke with patient and she states Dr. Levada Dy did her labs. She will have his office fax results. She also states he wants her to schedule f/u with Dr. Olevia Perches. Scheduled on 02/09/13 at 1:45 PM.

## 2012-12-26 NOTE — Telephone Encounter (Signed)
Message copied by Hulan Saas on Tue Dec 26, 2012  4:23 PM ------      Message from: Hulan Saas      Created: Thu Oct 26, 2012 10:34 AM       Did we get labs from [facility] short stay on patient for DB. CBC, iron panel ------

## 2013-01-01 ENCOUNTER — Encounter: Payer: Self-pay | Admitting: Family Medicine

## 2013-01-01 ENCOUNTER — Ambulatory Visit (INDEPENDENT_AMBULATORY_CARE_PROVIDER_SITE_OTHER): Payer: Medicare Other | Admitting: Family Medicine

## 2013-01-01 VITALS — BP 114/72 | HR 100 | Temp 99.7°F | Wt 175.8 lb

## 2013-01-01 DIAGNOSIS — J209 Acute bronchitis, unspecified: Secondary | ICD-10-CM

## 2013-01-01 MED ORDER — METHYLPREDNISOLONE ACETATE 80 MG/ML IJ SUSP
80.0000 mg | Freq: Once | INTRAMUSCULAR | Status: AC
  Administered 2013-01-01: 80 mg via INTRAMUSCULAR

## 2013-01-01 MED ORDER — FLUTICASONE-SALMETEROL 250-50 MCG/DOSE IN AEPB: 1.0000 | INHALATION_SPRAY | Freq: Two times a day (BID) | RESPIRATORY_TRACT | Status: DC

## 2013-01-01 MED ORDER — ALBUTEROL SULFATE (2.5 MG/3ML) 0.083% IN NEBU
2.5000 mg | INHALATION_SOLUTION | Freq: Once | RESPIRATORY_TRACT | Status: AC
  Administered 2013-01-01: 2.5 mg via RESPIRATORY_TRACT

## 2013-01-01 MED ORDER — ALBUTEROL SULFATE HFA 108 (90 BASE) MCG/ACT IN AERS: 2.0000 | INHALATION_SPRAY | Freq: Four times a day (QID) | RESPIRATORY_TRACT | Status: DC | PRN

## 2013-01-01 MED ORDER — CLARITHROMYCIN ER 500 MG PO TB24: 1000.0000 mg | ORAL_TABLET | Freq: Every day | ORAL | Status: DC

## 2013-01-01 MED ORDER — AMOXICILLIN-POT CLAVULANATE 875-125 MG PO TABS
1.0000 | ORAL_TABLET | Freq: Two times a day (BID) | ORAL | Status: DC
Start: 2013-01-01 — End: 2013-04-02

## 2013-01-01 NOTE — Patient Instructions (Signed)
Bronchitis Bronchitis is the body's way of reacting to injury and/or infection (inflammation) of the bronchi. Bronchi are the air tubes that extend from the windpipe into the lungs. If the inflammation becomes severe, it may cause shortness of breath. CAUSES  Inflammation may be caused by:  A virus.  Germs (bacteria).  Dust.  Allergens.  Pollutants and many other irritants. The cells lining the bronchial tree are covered with tiny hairs (cilia). These constantly beat upward, away from the lungs, toward the mouth. This keeps the lungs free of pollutants. When these cells become too irritated and are unable to do their job, mucus begins to develop. This causes the characteristic cough of bronchitis. The cough clears the lungs when the cilia are unable to do their job. Without either of these protective mechanisms, the mucus would settle in the lungs. Then you would develop pneumonia. Smoking is a common cause of bronchitis and can contribute to pneumonia. Stopping this habit is the single most important thing you can do to help yourself. TREATMENT   Your caregiver may prescribe an antibiotic if the cough is caused by bacteria. Also, medicines that open up your airways make it easier to breathe. Your caregiver may also recommend or prescribe an expectorant. It will loosen the mucus to be coughed up. Only take over-the-counter or prescription medicines for pain, discomfort, or fever as directed by your caregiver.  Removing whatever causes the problem (smoking, for example) is critical to preventing the problem from getting worse.  Cough suppressants may be prescribed for relief of cough symptoms.  Inhaled medicines may be prescribed to help with symptoms now and to help prevent problems from returning.  For those with recurrent (chronic) bronchitis, there may be a need for steroid medicines. SEEK IMMEDIATE MEDICAL CARE IF:   During treatment, you develop more pus-like mucus (purulent  sputum).  You have a fever.  Your baby is older than 3 months with a rectal temperature of 102 F (38.9 C) or higher.  Your baby is 28 months old or younger with a rectal temperature of 100.4 F (38 C) or higher.  You become progressively more ill.  You have increased difficulty breathing, wheezing, or shortness of breath. It is necessary to seek immediate medical care if you are elderly or sick from any other disease. MAKE SURE YOU:   Understand these instructions.  Will watch your condition.  Will get help right away if you are not doing well or get worse. Document Released: 08/23/2005 Document Revised: 11/15/2011 Document Reviewed: 07/02/2008 [facility] Patient Information 2013 [place].

## 2013-01-01 NOTE — Addendum Note (Signed)
Addended by: Ewing Schlein on: 01/01/2013 04:04 PM   Modules accepted: Orders, Medications

## 2013-01-01 NOTE — Addendum Note (Signed)
Addended by: Ewing Schlein on: 01/01/2013 04:13 PM   Modules accepted: Orders

## 2013-01-01 NOTE — Progress Notes (Signed)
  Subjective:      [name] is a 57 y.o. female here for evaluation of a cough. Onset of symptoms was 4 days ago. Symptoms have been gradually worsening since that time. The cough is productive and is aggravated by infection, pollens and reclining position. Associated symptoms include: postnasal drip, shortness of breath, sputum production and wheezing. Patient does not have a history of asthma.   Patient does have a history of environmental allergens. Patient has not traveled recently. Patient Eustaquio Boyden a history of smoking. Patient has not had a previous chest x-ray. Patient has not had a PPD done.  The following portions of the patient's history were reviewed and updated as appropriate: allergies, current medications, past family history, past medical history, past social history, past surgical history and problem list.  Review of Systems Pertinent items are noted in HPI.    Objective:    Oxygen saturation 97% on room air BP 114/72  Pulse 100  Temp(Src) 99.7 F (37.6 C) (Oral)  Wt 175 lb 12.8 oz (79.742 kg)  BMI 30.16 kg/m2  SpO2 97% General appearance: alert, cooperative, appears stated age and no distress Ears: normal TM's and external ear canals both ears Nose: green discharge, moderate congestion, turbinates red, swollen, sinus tenderness bilateral Throat: lips, mucosa, and tongue normal; teeth and gums normal Neck: moderate anterior cervical adenopathy, supple, symmetrical, trachea midline and thyroid not enlarged, symmetric, no tenderness/mass/nodules Lungs: wheezes bilaterally Heart: S1, S2 normal    Assessment:    Acute Bronchitis    Plan:    Antibiotics per medication orders. Antitussives per medication orders. Avoid exposure to tobacco smoke and fumes. B-agonist inhaler. Call if shortness of breath worsens, blood in sputum, change in character of cough, development of fever or chills, inability to maintain nutrition and hydration. Avoid exposure to tobacco  smoke and fumes. Steroid inhaler as ordered. Smoking cessation.

## 2013-01-11 ENCOUNTER — Other Ambulatory Visit: Payer: Self-pay | Admitting: Gynecology

## 2013-01-11 DIAGNOSIS — N83209 Unspecified ovarian cyst, unspecified side: Secondary | ICD-10-CM

## 2013-01-26 ENCOUNTER — Other Ambulatory Visit: Payer: Self-pay

## 2013-01-26 MED ORDER — ALPRAZOLAM 0.25 MG PO TABS: 0.2500 mg | ORAL_TABLET | Freq: Every day | ORAL | Status: DC | PRN

## 2013-02-03 ENCOUNTER — Other Ambulatory Visit: Payer: Self-pay | Admitting: Internal Medicine

## 2013-02-09 ENCOUNTER — Ambulatory Visit (INDEPENDENT_AMBULATORY_CARE_PROVIDER_SITE_OTHER): Payer: Medicare Other | Admitting: Internal Medicine

## 2013-02-09 ENCOUNTER — Other Ambulatory Visit: Payer: Medicare Other

## 2013-02-09 ENCOUNTER — Encounter: Payer: Self-pay | Admitting: Internal Medicine

## 2013-02-09 VITALS — BP 118/60 | HR 111 | Ht 64.0 in | Wt 176.0 lb

## 2013-02-09 DIAGNOSIS — K91850 Pouchitis: Secondary | ICD-10-CM

## 2013-02-09 DIAGNOSIS — K51918 Ulcerative colitis, unspecified with other complication: Secondary | ICD-10-CM

## 2013-02-09 DIAGNOSIS — K5190 Ulcerative colitis, unspecified, without complications: Secondary | ICD-10-CM

## 2013-02-09 MED ORDER — HYDROCORTISONE ACE-PRAMOXINE 1-1 % RE CREA: TOPICAL_CREAM | Freq: Two times a day (BID) | RECTAL | Status: DC

## 2013-02-09 MED ORDER — CIPROFLOXACIN HCL 250 MG PO TABS: 250.0000 mg | ORAL_TABLET | Freq: Two times a day (BID) | ORAL | Status: DC

## 2013-02-09 MED ORDER — PANTOPRAZOLE SODIUM 40 MG PO TBEC: 40.0000 mg | DELAYED_RELEASE_TABLET | Freq: Two times a day (BID) | ORAL | Status: DC

## 2013-02-09 NOTE — Patient Instructions (Addendum)
Dr Corliss Blacker, Dr Rubbie Battiest will go to the basement for labs today We have sent in your prescriptions to your pharmacy Will need Pneumovax

## 2013-02-09 NOTE — Progress Notes (Signed)
Davelyn Mcshane [birth year] MRN [id number]        History of Present Illness:  This is a 57 year old white female with ulcerative colitis diagnosed in 71. Ileoproctostomy in 1998, last flexible sigmoidoscopy in September 2012 showed active colitis at the anastomosis at 20 cm. Biopsies suggested Crohn's disease. She has chronic diarrhea but has done very well on Remicade 5 mg per kilogram every 8 weeks. She usually has a flareup of diarrhea and rectal drainage several days before the next  infusion. She has chronic renal insufficiency followed by Dr. Corliss Blacker. She has chronic anxiety. nd pouchitis. She has a history of iron deficiency requiring periodic iron infusions   Past Medical History  Diagnosis Date  . History of small bowel obstruction   . Obesity   . Hyperparathyroidism   . Hypokalemia   . Hypertension   . Gastric polyp   . GERD (gastroesophageal reflux disease)   . Depression   . Anxiety   . Anemia   . Renal insufficiency   . Ulcerative colitis   . RLS (restless legs syndrome)   . Pulmonary nodule, right     right upper lobe  . Anal stenosis   . Esophagitis   . De Quervain's tenosynovitis   . Hyperlipidemia   . Fibroid   . History of cervical dysplasia   . Asthma   . Hearing loss   . Visual disturbance   . Abdominal pain   . Rectal bleeding   . Blood in stool   . Rectal pain   . Difficulty urinating   . Blood in urine   . Fainting     due to dehydration  . Weakness generalized   . Fatigue   . Easy bruising   . Hemorrhoids, internal   . Hemorrhoids, external   . Ovarian cyst   . Herpes, genital     vaginal treated 07/05/12 and pt states is resolved   Past Surgical History  Procedure Laterality Date  . Anal dilation    . 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 Ileostomy closure    . Cholecystectomy    . Tubal ligation    . Hemorrhoid surgery    . Fatty tumor removed from back    . Total  abdominal hysterectomy  1998    TAH/LSO  . Cervical biopsy  w/ loop electrode excision    . Upper gastrointestinal endoscopy      reports that she quit smoking about 33 years ago. Her smoking use included Cigarettes. She smoked 0.00 packs per day. She has never used smokeless tobacco. She reports that she does not drink alcohol or use illicit drugs. family history includes Cancer in her maternal uncle and sister; Diabetes in her sister; Heart disease in her father and mother; Hypertension in her father and mother; Irritable bowel syndrome in an unspecified family member; and Ulcerative colitis in her daughter and father.  There is no history of Colon cancer. Allergies  Allergen Reactions  . Morphine   . Sulfonamide Derivatives         Review of Systems: Increase in heartburn. Denies dysphagia, positive for weight gain of 30 pounds  The remainder of the 10 point ROS is negative except as outlined in H&P   Physical Exam: General appearance  Well developed, in no distress. Eyes- non icteric. HEENT nontraumatic, normocephalic. Mouth no lesions, tongue papillated, no cheilosis. Neck supple without adenopathy, thyroid not enlarged, no  carotid bruits, no JVD. Lungs Clear to auscultation bilaterally. Cor normal S1, normal S2, regular rhythm, no murmur,  quiet precordium. Abdomen: Obese soft nontender. Normoactive bowel sounds  Rectal: Macerated perirectal mucosa from seepage of stool. Erythema and edema. Skin is very tender. Anal canal is very tender complete digital exam could not be performed due to discomfort Extremities no pedal edema. Skin no lesions. Neurological alert and oriented x 3. Psychological normal mood and affect.  Assessment and Plan:  57 year old white female status post total colectomy and ileoproctostomy for  ulcerative colitis in 1998. She has periodic pouchitis.for which she takes Cipro or Flagyl. She is currently doing well on Remicade infusions.Refill Cipro 250 mg  by mouth twice a day for 10 days for pouchitis. Cortisone cream for rectal irritation. Calmoseptine ointment externally, samples given.  Gastroesophageal reflux disease,exacerbation, likely due to weight gain. we will increase Protonix to 40 mg by mouth twice a day, I advised her to lose weight . Today she will be checked  hepatitis  B serology. She is up-to-date on her bone density last one December 2013. She will check on her Pneumovax. She has never had one but is afraid that her insurance will not cover. Office visit 6 months   02/09/2013 [name]

## 2013-02-10 LAB — HEPATITIS B CORE ANTIBODY, IGM: Hep B C IgM: NEGATIVE

## 2013-02-10 LAB — HEPATITIS A ANTIBODY, IGM: Hep A IgM: NEGATIVE

## 2013-02-10 LAB — HEPATITIS B SURFACE ANTIGEN: Hepatitis B Surface Ag: NEGATIVE

## 2013-02-12 LAB — HEPATITIS B SURFACE ANTIBODY,QUALITATIVE: Hep B S Ab: REACTIVE — AB

## 2013-02-14 ENCOUNTER — Encounter: Payer: Self-pay | Admitting: Gynecology

## 2013-02-14 ENCOUNTER — Ambulatory Visit (INDEPENDENT_AMBULATORY_CARE_PROVIDER_SITE_OTHER): Payer: Medicare Other | Admitting: Gynecology

## 2013-02-14 ENCOUNTER — Other Ambulatory Visit: Payer: Self-pay | Admitting: Internal Medicine

## 2013-02-14 ENCOUNTER — Ambulatory Visit (INDEPENDENT_AMBULATORY_CARE_PROVIDER_SITE_OTHER): Payer: Medicare Other

## 2013-02-14 VITALS — BP 128/84

## 2013-02-14 DIAGNOSIS — N83339 Acquired atrophy of ovary and fallopian tube, unspecified side: Secondary | ICD-10-CM

## 2013-02-14 DIAGNOSIS — R102 Pelvic and perineal pain: Secondary | ICD-10-CM

## 2013-02-14 DIAGNOSIS — N7011 Chronic salpingitis: Secondary | ICD-10-CM | POA: Insufficient documentation

## 2013-02-14 DIAGNOSIS — N949 Unspecified condition associated with female genital organs and menstrual cycle: Secondary | ICD-10-CM

## 2013-02-14 DIAGNOSIS — N83209 Unspecified ovarian cyst, unspecified side: Secondary | ICD-10-CM

## 2013-02-14 DIAGNOSIS — N7013 Chronic salpingitis and oophoritis: Secondary | ICD-10-CM

## 2013-02-14 DIAGNOSIS — K5190 Ulcerative colitis, unspecified, without complications: Secondary | ICD-10-CM

## 2013-02-14 NOTE — Progress Notes (Signed)
Is a 57 year old who was seen for her annual gynecological examination 07/05/2012 patient that last year had been evaluated for nephrolithiasis by her urologist for which patient spontaneously had passed a kidney stone. During her workup a CT scan that demonstrated right ovarian cyst that measured 2.8 cm. She had an ultrasound in our office October 1. Patient with prior history of total abdominal hysterectomy with left salpingo-oophorectomy the ultrasound had demonstrated a thin wall cyst with scattered internal low level echoes, irregular shaped cyst measuring 3.7 x 3.8 x 2.5 cm with positive color flow at the wall of this cyst. Patient had a normal CA 125. She had previously been given a shot of Depo-Provera 150 mg an effort to see this this would shrink. The ultrasound had demonstrated that the cyst had gotten smaller with negative color flow.   Followup ultrasound July 2013 had a dimension 3.5 x 2.4 x 3.7 cm thin echo-free totally avascular.   Patient has had history of ulcerative colitis since 1990 and has been followed by Dr. Maurene Capes.She underwent a total colectomy and ileoproctostomy in 1998 for intractable disease. She has a history of pouchitis. Her last flexible sigmoidoscopy in September 2012 showed active inflammatory bowel disease at the anastomosis at 20 cm.   She has been followed for renal insufficiency by Dr Servando Salina.    She had  received 2 shots of Depo-Provera and presented on January 2014  for the ultrasound. Once again absence of the uterus and left tube and ovary. Right ovary thinwall echo-free cyst smaller in size now measuring 29 x 21 x 31 mm average size 2.7 cm. Totally avascular. A small follicle measured 6 x 4 mm was noted adjacent. Left adnexa otherwise negative.   Patient has had history of ulcerative colitis since 1990 and has been followed by Dr. Maurene Capes.She underwent a total colectomy and ileoproctostomy in 1998 for intractable disease. She has a history of pouchitis. Her  last flexible sigmoidoscopy in September 2012 showed active inflammatory bowel disease at the anastomosis at 20 cm.  She has been followed for renal insufficiency by Dr Servando Salina.   The patient presented for followup ultrasound today. Once again left adnexa negative. Right ovary atrophic with a thick walled cystic area measuring 6 x 6 mm avascular continued presence of a thermal echo free cystic area measuring 3.1 x 3.2 x 2.2 cm average size 2.8 mm on appearance it looks like it's a hydrosalpinx and was avascular. No significant change over the past several years.  Patient had normal CA 125 of 7.3 2012 and on repeat in 2014 value was 8.1  Patient recently had been referred to the endocrinologist Dr. Bradly Chris because her PTH was found to be elevated at 110 although she had normal calcium and vitamin D levels. She stated this was attributed to her renal insufficiency history. She continues to take her calcium and vitamin D for osteoporosis prevention. Her bone density in January this year was stable with very mild osteopenia.  We discussed the findings of the ultrasound. 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 She agrees to continue with observation. Will continue to do an ultrasound and CA 125 yearly unless she becomes symptomatic. Patient fully understands that there is no guarantee that the likelihood of this being malignant  is very very low.

## 2013-02-15 ENCOUNTER — Encounter (HOSPITAL_COMMUNITY): Payer: Self-pay

## 2013-02-15 ENCOUNTER — Encounter (HOSPITAL_COMMUNITY)
Admission: RE | Admit: 2013-02-15 | Discharge: 2013-02-15 | Disposition: A | Discharge status: Home / Self Care | Payer: Medicare Other | Source: Ambulatory Visit | Attending: Internal Medicine | Admitting: Internal Medicine

## 2013-02-15 VITALS — BP 120/78 | HR 86 | Temp 99.0°F | Resp 18 | Ht 64.0 in | Wt 176.0 lb

## 2013-02-15 DIAGNOSIS — K5190 Ulcerative colitis, unspecified, without complications: Secondary | ICD-10-CM | POA: Insufficient documentation

## 2013-02-15 MED ORDER — ACETAMINOPHEN 325 MG PO TABS
650.0000 mg | ORAL_TABLET | Freq: Once | ORAL | Status: AC
  Administered 2013-02-15: 650 mg via ORAL
  Filled 2013-02-15: qty 2

## 2013-02-15 MED ORDER — DIPHENHYDRAMINE HCL 25 MG PO CAPS
25.0000 mg | ORAL_CAPSULE | Freq: Once | ORAL | Status: AC
  Administered 2013-02-15: 25 mg via ORAL
  Filled 2013-02-15: qty 1

## 2013-02-15 MED ORDER — SODIUM CHLORIDE 0.9 % IV SOLN
5.0000 mg/kg | Freq: Once | INTRAVENOUS | Status: AC
  Administered 2013-02-15: 400 mg via INTRAVENOUS
  Filled 2013-02-15: qty 40

## 2013-02-15 MED ORDER — SODIUM CHLORIDE 0.9 % IV SOLN
INTRAVENOUS | Status: DC
  Administered 2013-02-15: 08:00:00 via INTRAVENOUS

## 2013-04-02 ENCOUNTER — Inpatient Hospital Stay (HOSPITAL_COMMUNITY): Payer: Medicare Other

## 2013-04-02 ENCOUNTER — Encounter (HOSPITAL_COMMUNITY): Payer: Self-pay

## 2013-04-02 ENCOUNTER — Inpatient Hospital Stay (HOSPITAL_COMMUNITY)
Admission: EM | Admit: 2013-04-02 | Discharge: 2013-04-05 | DRG: 683 | Disposition: A | Discharge status: Home / Self Care | Payer: Medicare Other | Attending: Internal Medicine | Admitting: Internal Medicine

## 2013-04-02 DIAGNOSIS — Z6830 Body mass index (BMI) 30.0-30.9, adult: Secondary | ICD-10-CM

## 2013-04-02 DIAGNOSIS — Z791 Long term (current) use of non-steroidal anti-inflammatories (NSAID): Secondary | ICD-10-CM

## 2013-04-02 DIAGNOSIS — F411 Generalized anxiety disorder: Secondary | ICD-10-CM | POA: Diagnosis present

## 2013-04-02 DIAGNOSIS — Z9049 Acquired absence of other specified parts of digestive tract: Secondary | ICD-10-CM

## 2013-04-02 DIAGNOSIS — E785 Hyperlipidemia, unspecified: Secondary | ICD-10-CM | POA: Diagnosis present

## 2013-04-02 DIAGNOSIS — D649 Anemia, unspecified: Secondary | ICD-10-CM

## 2013-04-02 DIAGNOSIS — N182 Chronic kidney disease, stage 2 (mild): Secondary | ICD-10-CM | POA: Diagnosis present

## 2013-04-02 DIAGNOSIS — F329 Major depressive disorder, single episode, unspecified: Secondary | ICD-10-CM

## 2013-04-02 DIAGNOSIS — N179 Acute kidney failure, unspecified: Secondary | ICD-10-CM

## 2013-04-02 DIAGNOSIS — J45909 Unspecified asthma, uncomplicated: Secondary | ICD-10-CM | POA: Diagnosis present

## 2013-04-02 DIAGNOSIS — E872 Acidosis, unspecified: Secondary | ICD-10-CM | POA: Diagnosis present

## 2013-04-02 DIAGNOSIS — E871 Hypo-osmolality and hyponatremia: Secondary | ICD-10-CM | POA: Diagnosis present

## 2013-04-02 DIAGNOSIS — K219 Gastro-esophageal reflux disease without esophagitis: Secondary | ICD-10-CM | POA: Diagnosis present

## 2013-04-02 DIAGNOSIS — N184 Chronic kidney disease, stage 4 (severe): Secondary | ICD-10-CM | POA: Diagnosis present

## 2013-04-02 DIAGNOSIS — E669 Obesity, unspecified: Secondary | ICD-10-CM | POA: Diagnosis present

## 2013-04-02 DIAGNOSIS — H9190 Unspecified hearing loss, unspecified ear: Secondary | ICD-10-CM | POA: Diagnosis present

## 2013-04-02 DIAGNOSIS — E860 Dehydration: Secondary | ICD-10-CM

## 2013-04-02 DIAGNOSIS — D72829 Elevated white blood cell count, unspecified: Secondary | ICD-10-CM | POA: Diagnosis present

## 2013-04-02 DIAGNOSIS — F3289 Other specified depressive episodes: Secondary | ICD-10-CM | POA: Diagnosis present

## 2013-04-02 DIAGNOSIS — I129 Hypertensive chronic kidney disease with stage 1 through stage 4 chronic kidney disease, or unspecified chronic kidney disease: Secondary | ICD-10-CM | POA: Diagnosis present

## 2013-04-02 DIAGNOSIS — Z87891 Personal history of nicotine dependence: Secondary | ICD-10-CM

## 2013-04-02 DIAGNOSIS — E876 Hypokalemia: Secondary | ICD-10-CM | POA: Diagnosis present

## 2013-04-02 DIAGNOSIS — Z79899 Other long term (current) drug therapy: Secondary | ICD-10-CM

## 2013-04-02 DIAGNOSIS — K5190 Ulcerative colitis, unspecified, without complications: Secondary | ICD-10-CM

## 2013-04-02 LAB — URINALYSIS, ROUTINE W REFLEX MICROSCOPIC
Bilirubin Urine: NEGATIVE
Glucose, UA: NEGATIVE mg/dL
Hgb urine dipstick: NEGATIVE
Ketones, ur: NEGATIVE mg/dL
Nitrite: NEGATIVE
Protein, ur: NEGATIVE mg/dL
Specific Gravity, Urine: 1.019 (ref 1.005–1.030)
Urobilinogen, UA: 0.2 mg/dL (ref 0.0–1.0)
pH: 5.5 (ref 5.0–8.0)

## 2013-04-02 LAB — CBC WITH DIFFERENTIAL/PLATELET
Basophils Absolute: 0 10*3/uL (ref 0.0–0.1)
Basophils Relative: 0 % (ref 0–1)
Eosinophils Absolute: 0.3 10*3/uL (ref 0.0–0.7)
Eosinophils Relative: 2 % (ref 0–5)
HCT: 39.4 % (ref 36.0–46.0)
Hemoglobin: 13 g/dL (ref 12.0–15.0)
Lymphocytes Relative: 19 % (ref 12–46)
Lymphs Abs: 3.2 10*3/uL (ref 0.7–4.0)
MCH: 31.6 pg (ref 26.0–34.0)
MCHC: 33 g/dL (ref 30.0–36.0)
MCV: 95.9 fL (ref 78.0–100.0)
Monocytes Absolute: 1.2 10*3/uL — ABNORMAL HIGH (ref 0.1–1.0)
Monocytes Relative: 7 % (ref 3–12)
Neutro Abs: 12.1 10*3/uL — ABNORMAL HIGH (ref 1.7–7.7)
Neutrophils Relative %: 72 % (ref 43–77)
Platelets: 508 10*3/uL — ABNORMAL HIGH (ref 150–400)
RBC: 4.11 MIL/uL (ref 3.87–5.11)
RDW: 14.2 % (ref 11.5–15.5)
WBC: 16.8 10*3/uL — ABNORMAL HIGH (ref 4.0–10.5)

## 2013-04-02 LAB — URINE MICROSCOPIC-ADD ON

## 2013-04-02 LAB — COMPREHENSIVE METABOLIC PANEL
ALT: 19 U/L (ref 0–35)
AST: 19 U/L (ref 0–37)
Albumin: 4.1 g/dL (ref 3.5–5.2)
Alkaline Phosphatase: 77 U/L (ref 39–117)
BUN: 47 mg/dL — ABNORMAL HIGH (ref 6–23)
CO2: 15 mEq/L — ABNORMAL LOW (ref 19–32)
Calcium: 10.6 mg/dL — ABNORMAL HIGH (ref 8.4–10.5)
Chloride: 99 mEq/L (ref 96–112)
Creatinine, Ser: 3.76 mg/dL — ABNORMAL HIGH (ref 0.50–1.10)
GFR calc Af Amer: 14 mL/min — ABNORMAL LOW (ref >90)
GFR calc non Af Amer: 12 mL/min — ABNORMAL LOW (ref >90)
Glucose, Bld: 103 mg/dL — ABNORMAL HIGH (ref 70–99)
Potassium: 4.7 mEq/L (ref 3.5–5.1)
Sodium: 131 mEq/L — ABNORMAL LOW (ref 135–145)
Total Bilirubin: 0.5 mg/dL (ref 0.3–1.2)
Total Protein: 8.8 g/dL — ABNORMAL HIGH (ref 6.0–8.3)

## 2013-04-02 LAB — LACTIC ACID, PLASMA: Lactic Acid, Venous: 0.6 mmol/L (ref 0.5–2.2)

## 2013-04-02 IMAGING — CR DG CHEST 2V
2 series · 2 of 2 positions shown · non-contrast
Comparison: Chest radiograph [DATE] and chest CT [DATE].

CLINICAL DATA: Dehydration; history of asthma

CHEST - 2 VIEW

[w chest pa]
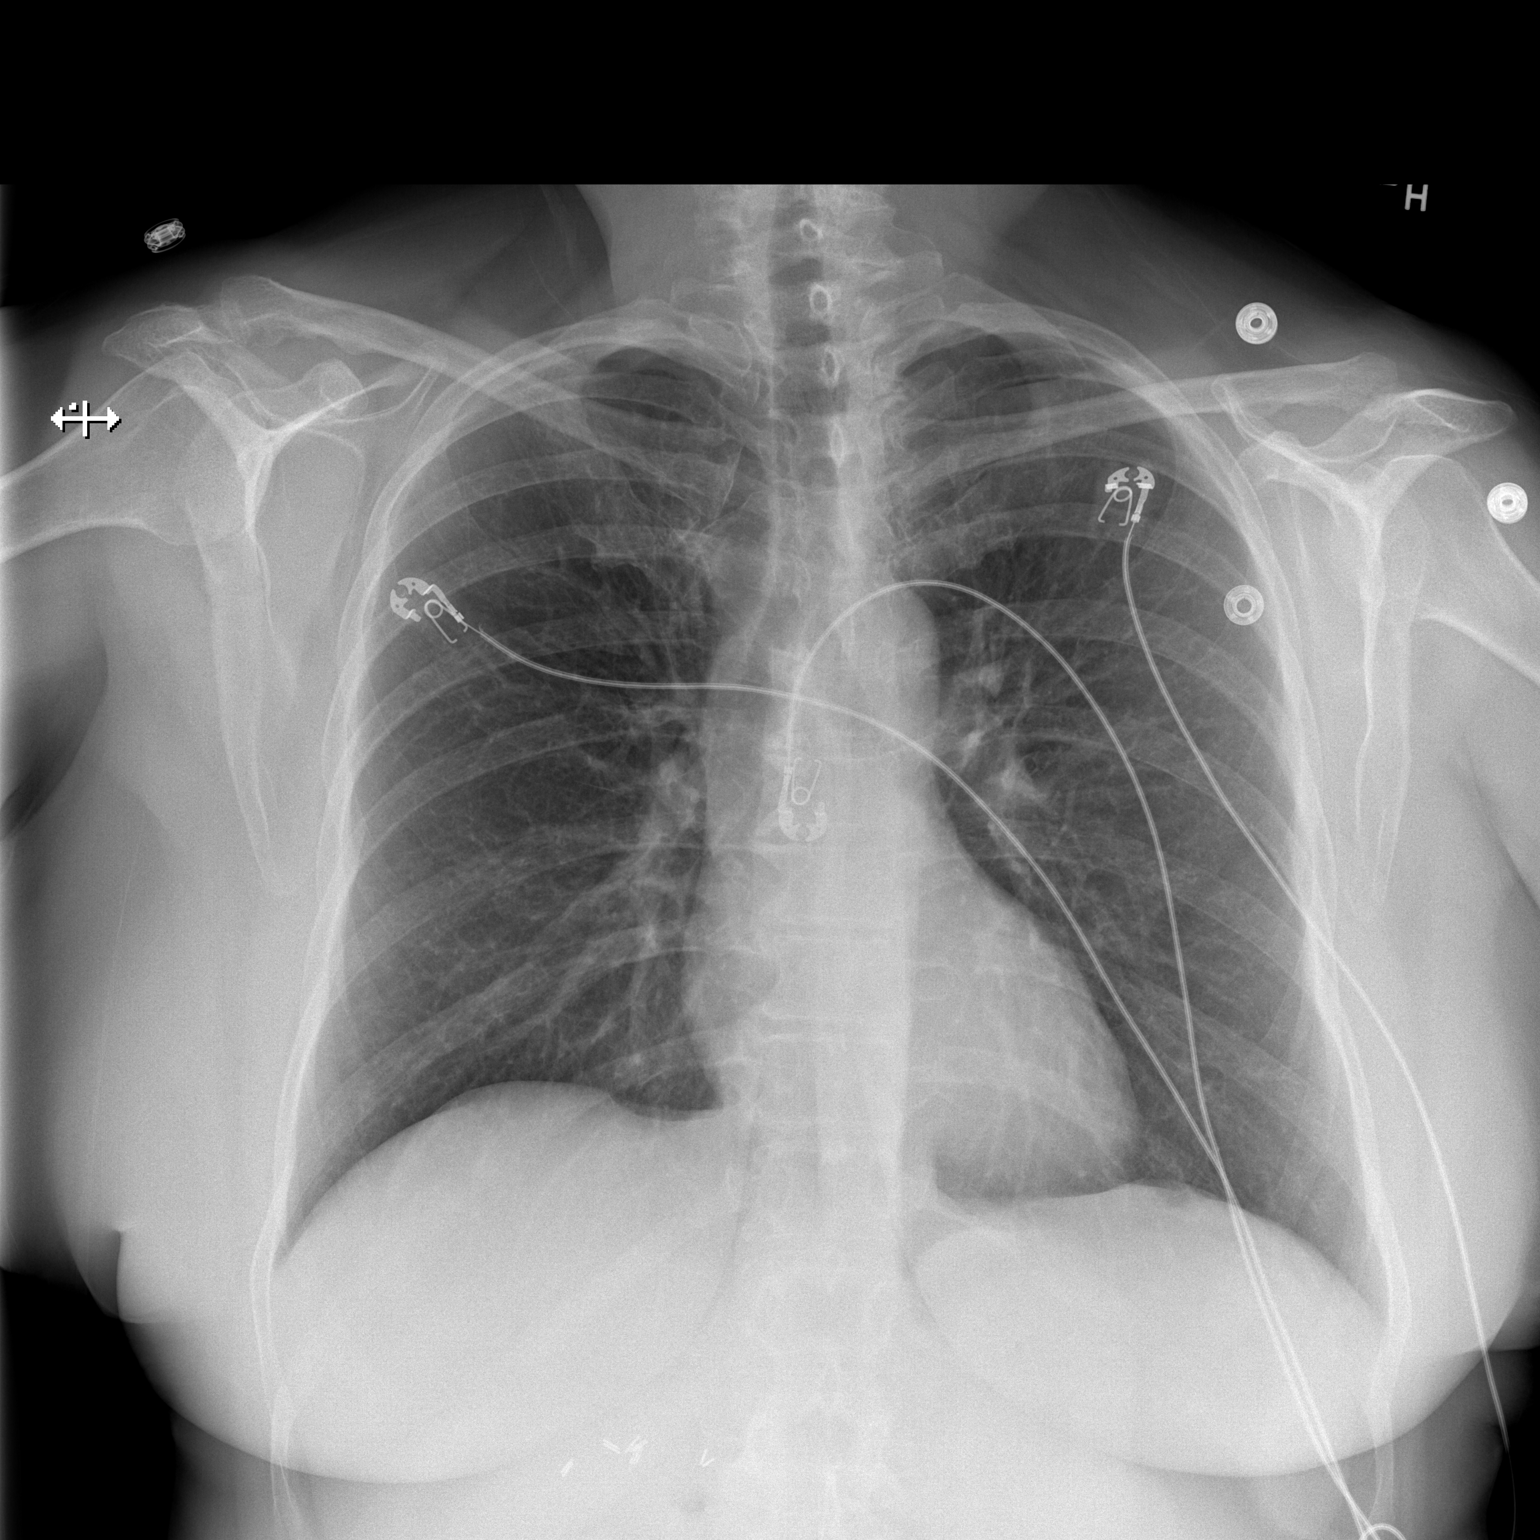

[w chest lat]
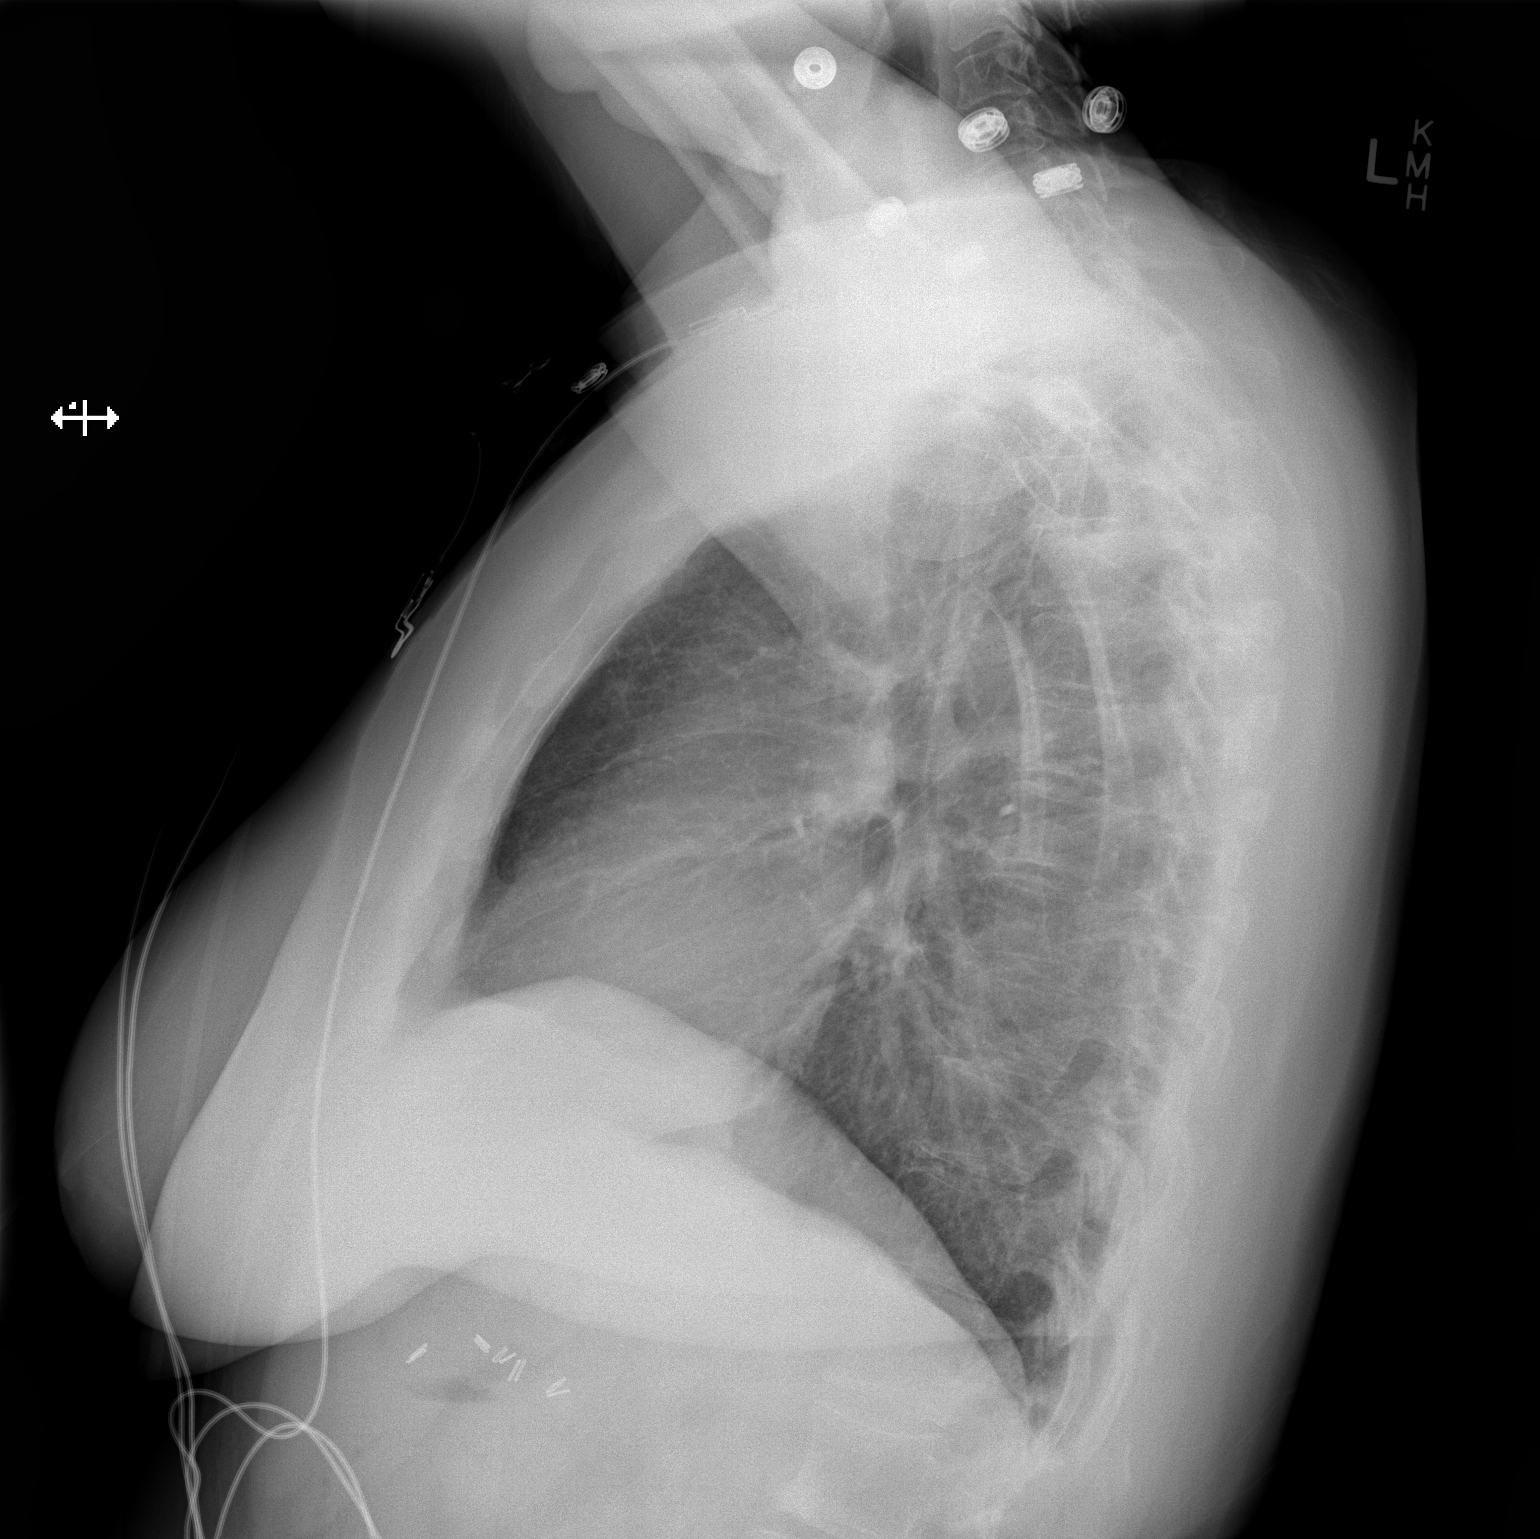

[2 of 2 positions shown; findings below may reference images not displayed]

FINDINGS: There is no edema or consolidation.  Heart size and
pulmonary vascularity are normal.  No adenopathy.  No bone lesions.
IMPRESSION: No edema or consolidation.

## 2013-04-02 MED ORDER — ACETAMINOPHEN 650 MG RE SUPP: 650.0000 mg | Freq: Four times a day (QID) | RECTAL | Status: DC | PRN

## 2013-04-02 MED ORDER — SODIUM CHLORIDE 0.9 % IV BOLUS (SEPSIS)
2000.0000 mL | Freq: Once | INTRAVENOUS | Status: AC
  Administered 2013-04-02: 2000 mL via INTRAVENOUS

## 2013-04-02 MED ORDER — HEPARIN SODIUM (PORCINE) 5000 UNIT/ML IJ SOLN
5000.0000 [IU] | Freq: Three times a day (TID) | INTRAMUSCULAR | Status: DC
  Administered 2013-04-02 – 2013-04-03 (×4): 5000 [IU] via SUBCUTANEOUS
  Filled 2013-04-02 (×8): qty 1

## 2013-04-02 MED ORDER — SODIUM CHLORIDE 0.9 % IV SOLN
INTRAVENOUS | Status: DC
  Administered 2013-04-02 – 2013-04-04 (×6): via INTRAVENOUS

## 2013-04-02 MED ORDER — METOPROLOL SUCCINATE ER 50 MG PO TB24
50.0000 mg | ORAL_TABLET | Freq: Every day | ORAL | Status: DC
  Administered 2013-04-02 – 2013-04-05 (×4): 50 mg via ORAL
  Filled 2013-04-02 (×4): qty 1

## 2013-04-02 MED ORDER — ALPRAZOLAM 0.25 MG PO TABS
0.2500 mg | ORAL_TABLET | Freq: Every day | ORAL | Status: DC | PRN
  Administered 2013-04-02: 0.25 mg via ORAL
  Filled 2013-04-02: qty 1

## 2013-04-02 MED ORDER — ALBUTEROL SULFATE HFA 108 (90 BASE) MCG/ACT IN AERS: 2.0000 | INHALATION_SPRAY | Freq: Four times a day (QID) | RESPIRATORY_TRACT | Status: DC | PRN

## 2013-04-02 MED ORDER — SODIUM CHLORIDE 0.9 % IV SOLN: INTRAVENOUS | Status: DC

## 2013-04-02 MED ORDER — SODIUM CHLORIDE 0.9 % IJ SOLN
3.0000 mL | Freq: Two times a day (BID) | INTRAMUSCULAR | Status: DC
  Administered 2013-04-02: 3 mL via INTRAVENOUS

## 2013-04-02 MED ORDER — MOMETASONE FURO-FORMOTEROL FUM 100-5 MCG/ACT IN AERO
2.0000 | INHALATION_SPRAY | Freq: Two times a day (BID) | RESPIRATORY_TRACT | Status: DC
  Administered 2013-04-02 – 2013-04-05 (×6): 2 via RESPIRATORY_TRACT
  Filled 2013-04-02: qty 8.8

## 2013-04-02 MED ORDER — DULOXETINE HCL 60 MG PO CPEP
60.0000 mg | ORAL_CAPSULE | Freq: Every day | ORAL | Status: DC
  Administered 2013-04-02 – 2013-04-03 (×2): 60 mg via ORAL
  Filled 2013-04-02 (×2): qty 1

## 2013-04-02 MED ORDER — PANTOPRAZOLE SODIUM 40 MG PO TBEC
40.0000 mg | DELAYED_RELEASE_TABLET | Freq: Two times a day (BID) | ORAL | Status: DC
  Administered 2013-04-02 – 2013-04-05 (×6): 40 mg via ORAL
  Filled 2013-04-02 (×7): qty 1

## 2013-04-02 MED ORDER — ACETAMINOPHEN 325 MG PO TABS: 650.0000 mg | ORAL_TABLET | Freq: Four times a day (QID) | ORAL | Status: DC | PRN

## 2013-04-02 MED ORDER — HYDROCODONE-ACETAMINOPHEN 5-325 MG PO TABS: 1.0000 | ORAL_TABLET | Freq: Four times a day (QID) | ORAL | Status: DC | PRN

## 2013-04-02 NOTE — ED Notes (Signed)
Patient reports that she has an internal J pouch and has had liquid stools x 2 days. patient c/o muscle cramping and c/o having excessive gas and burping.

## 2013-04-02 NOTE — ED Provider Notes (Signed)
CSN: [id number]     Arrival date & time 04/02/13  1205 History     First MD Initiated Contact with Patient 04/02/13 1402     Chief Complaint  Patient presents with  . Dehydration   (Consider location/radiation/quality/duration/timing/severity/associated sxs/prior Treatment) HPI Comments: Patient with history of Ulcerative Colitis, status post colectomy with and internal J pouch.  She presents feeling as though she is dehydrated.  She has been outside doing yard work and feels as though this may have contributed to this.  She reports cramping in her legs, muscle aches, and excessive belching.    The history is provided by the patient.    Past Medical History  Diagnosis Date  . History of small bowel obstruction   . Obesity   . Hyperparathyroidism   . Hypokalemia   . Hypertension   . Gastric polyp   . GERD (gastroesophageal reflux disease)   . Depression   . Anxiety   . Anemia   . Renal insufficiency   . Ulcerative colitis   . RLS (restless legs syndrome)   . Pulmonary nodule, right     right upper lobe  . Anal stenosis   . Esophagitis   . De Quervain's tenosynovitis   . Hyperlipidemia   . Fibroid   . History of cervical dysplasia   . Asthma   . Hearing loss   . Visual disturbance   . Abdominal pain   . Rectal bleeding   . Blood in stool   . Rectal pain   . Difficulty urinating   . Blood in urine   . Fainting     due to dehydration  . Weakness generalized   . Fatigue   . Easy bruising   . Hemorrhoids, internal   . Hemorrhoids, external   . Ovarian cyst   . Herpes, genital     vaginal treated 07/05/12 and pt states is resolved   Past Surgical History  Procedure Laterality Date  . Anal dilation    . 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 Ileostomy closure    . Cholecystectomy    . Tubal ligation    . Hemorrhoid surgery    . Fatty tumor removed from back    . Total abdominal hysterectomy  1998   TAH/LSO  . Cervical biopsy  w/ loop electrode excision    . Upper gastrointestinal endoscopy     Family History  Problem Relation Age of Onset  . Heart disease Father   . Ulcerative colitis Father   . Hypertension Father   . Ulcerative colitis Daughter   . Irritable bowel syndrome      grandchildren  . Colon cancer Neg Hx   . Hypertension Mother   . Heart disease Mother   . Diabetes Sister   . Cancer Sister     uterine  . Cancer Maternal Uncle     LUNG   History  Substance Use Topics  . Smoking status: Former Smoker    Types: Cigarettes    Quit date: 05/21/1979  . Smokeless tobacco: Never Used     Comment: quit age 45  . Alcohol Use: No   OB History   Grav Para Term Preterm Abortions TAB SAB Ect Mult Living   4 2 2  2  2   2      Review of Systems  Allergies  Morphine and Sulfonamide derivatives  Home Medications   Current Outpatient Rx  Name  Route  Sig  Dispense  Refill  . albuterol (PROAIR HFA) 108 (90 BASE) MCG/ACT inhaler   Inhalation   Inhale 2 puffs into the lungs every 6 (six) hours as needed for wheezing.   1 Inhaler   3   . ALPRAZolam (XANAX) 0.25 MG tablet   Oral   Take 1 tablet (0.25 mg total) by mouth daily as needed for anxiety.   30 tablet   1   . BIOTIN 5000 PO   Oral   Take by mouth daily.           . ciprofloxacin (CIPRO) 250 MG tablet   Oral   Take 1 tablet (250 mg total) by mouth 2 (two) times daily.   20 tablet   3   . CYMBALTA 60 MG capsule      TAKE 2 TABLETS BY MOUTH ONCE  DAILY   180 capsule   2     Dispense as written.   . diphenhydrAMINE (BENADRYL) 25 mg capsule      Give one po every 8 weeks prior to Remicade infustion   1 capsule   0   . diphenoxylate-atropine (LOMOTIL) 2.5-0.025 MG per tablet   Oral   Take 1 tablet by mouth 3 (three) times daily as needed.           . Fluticasone-Salmeterol (ADVAIR DISKUS) 250-50 MCG/DOSE AEPB   Inhalation   Inhale 1 puff into the lungs 2 (two) times daily.   1  each   3   . HYDROcodone-acetaminophen (VICODIN) 5-500 MG per tablet   Oral   Take 1 tablet by mouth every 6 (six) hours as needed for pain. Take 1 tablet by mouth every 4-6 hours as needed for pain. Must last 30 days   20 tablet   1     This rx must last 30 days.   . inFLIXimab (REMICADE) 100 MG injection      Infuse 5 mg/kg IV at 0 weeks, 2 weeks, 6 weeks and then every 8 weeks thereafter. Give Benadryl 25 mg 1 tablet by mouth and/or acetaminophen 650 mg by mouth before each infusion. Also add Ativan 0.5 mg 1 tablet by mouth before infusions. TB test: 06/09/11 Negative (0 mm) Ht: 5'4'' Wt: 157 pounds  Dx: Ulcerative Colitis   3 each   0   . mesalamine (CANASA) 1000 MG suppository   Rectal   Place 1 suppository (1,000 mg total) rectally at bedtime.   30 suppository   3   . metoprolol succinate (TOPROL-XL) 100 MG 24 hr tablet      Take 0.5 Tablet Daily   90 tablet   3     Pt needs appointment then refill can be made   . Multiple Vitamins-Minerals (MULTIVITAL) CHEW   Oral   Chew by mouth.         . pantoprazole (PROTONIX) 40 MG tablet   Oral   Take 1 tablet (40 mg total) by mouth 2 (two) times daily at 10 AM and 5 PM.   180 tablet   3    BP 124/76  Pulse 99  Temp(Src) 97.9 F (36.6 C) (Oral)  Resp 20  Ht 5' 4"  (1.626 m)  Wt 170 lb (77.111 kg)  BMI 29.17 kg/m2  SpO2 100% Physical Exam  ED Course   Procedures (including critical care time)  Labs Reviewed  CBC WITH DIFFERENTIAL - Abnormal; Notable for the following:    WBC 16.8 (*)  Platelets 508 (*)    Neutro Abs 12.1 (*)    Monocytes Absolute 1.2 (*)    All other components within normal limits  COMPREHENSIVE METABOLIC PANEL - Abnormal; Notable for the following:    Sodium 131 (*)    CO2 15 (*)    Glucose, Bld 103 (*)    BUN 47 (*)    Creatinine, Ser 3.76 (*)    Calcium 10.6 (*)    Total Protein 8.8 (*)    GFR calc non Af Amer 12 (*)    GFR calc Af Amer 14 (*)    All other components  within normal limits   No results found. No diagnosis found.  MDM  The labs reveal acute renal failure with a metabolic acidosis.  She was given ivf in the ED and I have discussed with Dr. Janice Norrie who will accept the patient for admission.  Veryl Speak, MD 04/02/13 [phone]

## 2013-04-02 NOTE — H&P (Signed)
[facility] History and Physical  [name] TOI:[id number] DOB: 07/22/1956 DOA: 04/02/2013  Referring physician: Dr Stark Jock PCP: Garnet Koyanagi, DO  Specialists: Dr Olevia Perches.   Chief Complaint: weakness, feeling dehydrated.   HPI: [name] is a 57 y.o. female with PMH significant for ulcerative colitis, S/P total colectomy with internal J Pouch who presents to ED feeling dehydrated. She relates weakness on exertion, tired. She has notice increase output from her J pouch. She has been doing some gardening and working out site. She denies dysuria. She always has some loose stool.   Review of Systems: Negative except as per HPI.   Past Medical History  Diagnosis Date  . History of small bowel obstruction   . Obesity   . Hyperparathyroidism   . Hypokalemia   . Hypertension   . Gastric polyp   . GERD (gastroesophageal reflux disease)   . Depression   . Anxiety   . Anemia   . Renal insufficiency   . Ulcerative colitis   . RLS (restless legs syndrome)   . Pulmonary nodule, right     right upper lobe  . Anal stenosis   . Esophagitis   . De Quervain's tenosynovitis   . Hyperlipidemia   . Fibroid   . History of cervical dysplasia   . Asthma   . Hearing loss   . Visual disturbance   . Abdominal pain   . Rectal bleeding   . Blood in stool   . Rectal pain   . Difficulty urinating   . Blood in urine   . Fainting     due to dehydration  . Weakness generalized   . Fatigue   . Easy bruising   . Hemorrhoids, internal   . Hemorrhoids, external   . Ovarian cyst   . Herpes, genital     vaginal treated 07/05/12 and pt states is resolved   Past Surgical History  Procedure Laterality Date  . Anal dilation    . 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 Ileostomy closure    . Cholecystectomy    . Tubal ligation    . Hemorrhoid surgery    . Fatty tumor removed from back    . Total abdominal hysterectomy  1998   TAH/LSO  . Cervical biopsy  w/ loop electrode excision    . Upper gastrointestinal endoscopy     Social History:  reports that she quit smoking about 33 years ago. Her smoking use included Cigarettes. She smoked 0.00 packs per day. She has never used smokeless tobacco. She reports that she does not drink alcohol or use illicit drugs.   Allergies  Allergen Reactions  . Morphine   . Sulfonamide Derivatives     Family History  Problem Relation Age of Onset  . Heart disease Father   . Ulcerative colitis Father   . Hypertension Father   . Ulcerative colitis Daughter   . Irritable bowel syndrome      grandchildren  . Colon cancer Neg Hx   . Hypertension Mother   . Heart disease Mother   . Diabetes Sister   . Cancer Sister     uterine  . Cancer Maternal Uncle     LUNG     Prior to Admission medications   Medication Sig Start Date End Date Taking? Authorizing Provider  albuterol (PROAIR HFA) 108 (90 BASE) MCG/ACT inhaler Inhale 2 puffs into the lungs every 6 (  six) hours as needed for wheezing. 01/01/13  Yes Rosalita Chessman, DO  ALPRAZolam (XANAX) 0.25 MG tablet Take 1 tablet (0.25 mg total) by mouth daily as needed for anxiety. 01/26/13  Yes Lafayette Dragon, MD  BIOTIN 5000 PO Take by mouth daily.     Yes Historical Provider, MD  ciprofloxacin (CIPRO) 250 MG tablet Take 1 tablet (250 mg total) by mouth 2 (two) times daily. 02/09/13  Yes Lafayette Dragon, MD  CYMBALTA 60 MG capsule TAKE 2 TABLETS BY MOUTH ONCE  DAILY 09/01/12  Yes Rosalita Chessman, DO  diphenhydrAMINE (BENADRYL) 25 mg capsule Give one po every 8 weeks prior to Remicade infustion 09/29/11  Yes Lafayette Dragon, MD  diphenoxylate-atropine (LOMOTIL) 2.5-0.025 MG per tablet Take 1 tablet by mouth 3 (three) times daily as needed.     Yes Historical Provider, MD  Fluticasone-Salmeterol (ADVAIR DISKUS) 250-50 MCG/DOSE AEPB Inhale 1 puff into the lungs 2 (two) times daily. 01/01/13  Yes Rosalita Chessman, DO  HYDROcodone-acetaminophen (VICODIN)  5-500 MG per tablet Take 1 tablet by mouth every 6 (six) hours as needed for pain. Take 1 tablet by mouth every 4-6 hours as needed for pain. Must last 30 days 10/27/12  Yes Lafayette Dragon, MD  inFLIXimab (REMICADE) 100 MG injection Infuse 5 mg/kg IV at 0 weeks, 2 weeks, 6 weeks and then every 8 weeks thereafter. Give Benadryl 25 mg 1 tablet by mouth and/or acetaminophen 650 mg by mouth before each infusion. Also add Ativan 0.5 mg 1 tablet by mouth before infusions. TB test: 06/09/11 Negative (0 mm) Ht: 5'4'' Wt: 157 pounds  Dx: Ulcerative Colitis 06/16/11  Yes Lafayette Dragon, MD  mesalamine (CANASA) 1000 MG suppository Place 1 suppository (1,000 mg total) rectally at bedtime. 06/13/12  Yes Lafayette Dragon, MD  metoprolol succinate (TOPROL-XL) 100 MG 24 hr tablet Take 0.5 Tablet Daily 04/24/12  Yes Burtis Junes, NP  Multiple Vitamins-Minerals (MULTIVITAL) CHEW Chew by mouth.   Yes Historical Provider, MD  pantoprazole (PROTONIX) 40 MG tablet Take 1 tablet (40 mg total) by mouth 2 (two) times daily at 10 AM and 5 PM. 02/09/13  Yes Lafayette Dragon, MD   Physical Exam: Filed Vitals:   04/02/13 1500 04/02/13 1530 04/02/13 1600 04/02/13 1650  BP: 120/75 119/76 139/86 157/78  Pulse: 91 95 97 100  Temp:    97.7 F (36.5 C)  TempSrc:    Oral  Resp:    20  Height:    5' 4"  (1.626 m)  Weight:    80.332 kg (177 lb 1.6 oz)  SpO2: 99% 100% 100% 100%   General Appearance:    Alert, cooperative, no distress, appears stated age  Head:    Normocephalic, without obvious abnormality, atraumatic  Eyes:    PERRL, conjunctiva/corneas clear, EOM's intact,    Ears:    Normal TM's and external ear canals, both ears  Nose:   Nares normal, septum midline, mucosa normal, no drainage    or sinus tenderness  Throat:   Lips, mucosa, and tongue normal; teeth and gums normal  Neck:   Supple, symmetrical, trachea midline, no adenopathy;    thyroid:  no enlargement/tenderness/nodules; no carotid   bruit or JVD  Back:      Symmetric, no curvature, ROM normal, no CVA tenderness  Lungs:     Clear to auscultation bilaterally, respirations unlabored      Heart:    Regular rate and rhythm, S1 and S2 normal,  no murmur, rub   or gallop     Abdomen:     Soft, non-tender, bowel sounds active all four quadrants,    no masses, no organomegaly        Extremities:   Extremities normal, atraumatic, no cyanosis or edema  Pulses:   2+ and symmetric all extremities  Skin:   Skin color, texture, turgor normal, no rashes or lesions  Lymph nodes:   Cervical, supraclavicular, and axillary nodes normal  Neurologic:   CNII-XII intact, normal strength, sensation and reflexes    throughout      Labs on Admission:  Basic Metabolic Panel:  Recent Labs Lab 04/02/13 1425  NA 131*  K 4.7  CL 99  CO2 15*  GLUCOSE 103*  BUN 47*  CREATININE 3.76*  CALCIUM 10.6*   Liver Function Tests:  Recent Labs Lab 04/02/13 1425  AST 19  ALT 19  ALKPHOS 77  BILITOT 0.5  PROT 8.8*  ALBUMIN 4.1   No results found for this basename: LIPASE, AMYLASE,  in the last 168 hours No results found for this basename: AMMONIA,  in the last 168 hours CBC:  Recent Labs Lab 04/02/13 1425  WBC 16.8*  NEUTROABS 12.1*  HGB 13.0  HCT 39.4  MCV 95.9  PLT 508*   Cardiac Enzymes: No results found for this basename: CKTOTAL, CKMB, CKMBINDEX, TROPONINI,  in the last 168 hours  BNP (last 3 results) No results found for this basename: PROBNP,  in the last 8760 hours CBG: No results found for this basename: GLUCAP,  in the last 168 hours  Radiological Exams on Admission: Dg Chest 2 View  04/02/2013   *RADIOLOGY REPORT*  Clinical Data: Dehydration; history of asthma  CHEST - 2 VIEW  Comparison: Chest radiograph November 13, 2008 and chest CT February 11, 2009.  Findings: There is no edema or consolidation.  Heart size and pulmonary vascularity are normal.  No adenopathy.  No bone lesions.  IMPRESSION: No edema or consolidation.   Original Report  Authenticated By: Lowella Grip, M.D.     Assessment/Plan Active Problems:   HYPONATREMIA   LEUKOCYTOSIS   Chronic kidney disease (CKD), stage II (mild)   Acute on chronic renal failure   1-Acute on Chronic Renal Failure: Patient Cr baseline 1.3 to 1.7. Likely secondary to decrease volume from increase out put from J pouch. PAtient received 2 L IV fluids in the ED. I will continue with 125 cc/hour. Repeat renal function in am.  2-Leukocytosis: Could be hemoconcentration. Will check stool for C diff, check UA. Repeat in am.  3-Hyponatremia: In setting of decrease volume. Continue with IV fluids.  4-Metabolic acidosis: in setting GI loose and renal failure. IV fluids. No hypotension. Check lactic acid.  5-History of Ulcerative colitis: hold remicade.      Code Status: presume full code.  Family Communication: Care discussed with patient.  Disposition Plan: expect 2 to 3 days inpatient.   Time spent: 75 minutes.   Jadaya Sommerfield [facility] Pager [phone]  If 7PM-7AM, please contact night-coverage [email or web address] Password [facility] 04/02/2013, 5:11 PM

## 2013-04-03 DIAGNOSIS — F329 Major depressive disorder, single episode, unspecified: Secondary | ICD-10-CM

## 2013-04-03 DIAGNOSIS — N189 Chronic kidney disease, unspecified: Secondary | ICD-10-CM

## 2013-04-03 LAB — BASIC METABOLIC PANEL
BUN: 41 mg/dL — ABNORMAL HIGH (ref 6–23)
CO2: 14 mEq/L — ABNORMAL LOW (ref 19–32)
Calcium: 9 mg/dL (ref 8.4–10.5)
Chloride: 110 mEq/L (ref 96–112)
Creatinine, Ser: 2.8 mg/dL — ABNORMAL HIGH (ref 0.50–1.10)
GFR calc Af Amer: 20 mL/min — ABNORMAL LOW (ref >90)
GFR calc non Af Amer: 17 mL/min — ABNORMAL LOW (ref >90)
Glucose, Bld: 101 mg/dL — ABNORMAL HIGH (ref 70–99)
Potassium: 3.9 mEq/L (ref 3.5–5.1)
Sodium: 137 mEq/L (ref 135–145)

## 2013-04-03 LAB — CBC
HCT: 33.4 % — ABNORMAL LOW (ref 36.0–46.0)
Hemoglobin: 10.8 g/dL — ABNORMAL LOW (ref 12.0–15.0)
MCH: 30.8 pg (ref 26.0–34.0)
MCHC: 32 g/dL (ref 30.0–36.0)
MCV: 96.3 fL (ref 78.0–100.0)
Platelets: 340 10*3/uL (ref 150–400)
RBC: 3.47 MIL/uL — ABNORMAL LOW (ref 3.87–5.11)
RDW: 14.4 % (ref 11.5–15.5)
WBC: 9.5 10*3/uL (ref 4.0–10.5)

## 2013-04-03 LAB — CLOSTRIDIUM DIFFICILE BY PCR: Toxigenic C. Difficile by PCR: NEGATIVE

## 2013-04-03 MED ORDER — SODIUM BICARBONATE 650 MG PO TABS
650.0000 mg | ORAL_TABLET | Freq: Three times a day (TID) | ORAL | Status: DC
  Administered 2013-04-03 – 2013-04-05 (×7): 650 mg via ORAL
  Filled 2013-04-03 (×9): qty 1

## 2013-04-03 MED ORDER — DULOXETINE HCL 60 MG PO CPEP
120.0000 mg | ORAL_CAPSULE | Freq: Every day | ORAL | Status: DC
  Administered 2013-04-04 – 2013-04-05 (×2): 120 mg via ORAL
  Filled 2013-04-03 (×2): qty 2

## 2013-04-03 MED ORDER — DULOXETINE HCL 60 MG PO CPEP
60.0000 mg | ORAL_CAPSULE | Freq: Once | ORAL | Status: AC
  Administered 2013-04-03: 60 mg via ORAL
  Filled 2013-04-03: qty 1

## 2013-04-03 MED ORDER — DULOXETINE HCL 60 MG PO CPEP: 120.0000 mg | ORAL_CAPSULE | Freq: Every day | ORAL | Status: DC

## 2013-04-03 NOTE — Progress Notes (Signed)
[facility] PROGRESS NOTE  [name] JQB:[id number] DOB: [birth year] DOA: 04/02/2013 PCP: Jeanette Koyanagi, DO  Assessment/Plan: 1-Acute on Chronic Renal Failure: Patient Cr baseline 1.3 to 1.7. Likely secondary to decrease volume from increase out put from J pouch. Patient received 2 L IV fluids in the ED. I will continue with 125 cc/hour. Cr trending down at 2.8.  2-Leukocytosis: Could be hemoconcentration.  stool for C diff negative, check UA. Repeat in am.  3-Hyponatremia: In setting of decrease volume. Continue with IV fluids.  4-Metabolic acidosis: in setting GI loose and renal failure. IV fluids. No hypotension.  lactic acid normal. Will start sodium bicarb tablet. Marland Kitchen  5-History of Ulcerative colitis: hold remicade.    Code Status: Full  Family Communication: Care discussed with patient.  Disposition Plan: remain inpatient.    Consultants:  none  Procedures: none  Antibiotics:  none  HPI/Subjective: Feeling better. Decrease output from J pouch.  Feeling stronger.   Objective: Filed Vitals:   04/02/13 2004 04/02/13 2130 04/03/13 0615 04/03/13 0910  BP:  128/72 127/76   Pulse:  98 92   Temp:  98.2 F (36.8 C) 97.8 F (36.6 C)   TempSrc:  Oral Oral   Resp:  16 18   Height:      Weight:   80.7 kg (177 lb 14.6 oz)   SpO2: 100% 100% 100% 97%    Intake/Output Summary (Last 24 hours) at 04/03/13 0936 Last data filed at 04/03/13 0700  Gross per 24 hour  Intake 980.83 ml  Output   1500 ml  Net -519.17 ml   Filed Weights   04/02/13 1359 04/02/13 1650 04/03/13 0615  Weight: 77.111 kg (170 lb) 80.332 kg (177 lb 1.6 oz) 80.7 kg (177 lb 14.6 oz)    Exam:   General:  No distress.   Cardiovascular: S 1, S 2 RRR  Respiratory: CTA  Abdomen: BS present, soft, NT  Musculoskeletal: no edema.    Data Reviewed: Basic Metabolic Panel:  Recent Labs Lab 04/02/13 1425 04/03/13 0440  NA 131* 137  K 4.7 3.9  CL 99 110  CO2 15* 14*  GLUCOSE 103* 101*   BUN 47* 41*  CREATININE 3.76* 2.80*  CALCIUM 10.6* 9.0   Liver Function Tests:  Recent Labs Lab 04/02/13 1425  AST 19  ALT 19  ALKPHOS 77  BILITOT 0.5  PROT 8.8*  ALBUMIN 4.1   No results found for this basename: LIPASE, AMYLASE,  in the last 168 hours No results found for this basename: AMMONIA,  in the last 168 hours CBC:  Recent Labs Lab 04/02/13 1425 04/03/13 0440  WBC 16.8* 9.5  NEUTROABS 12.1*  --   HGB 13.0 10.8*  HCT 39.4 33.4*  MCV 95.9 96.3  PLT 508* 340   Cardiac Enzymes: No results found for this basename: CKTOTAL, CKMB, CKMBINDEX, TROPONINI,  in the last 168 hours BNP (last 3 results) No results found for this basename: PROBNP,  in the last 8760 hours CBG: No results found for this basename: GLUCAP,  in the last 168 hours  Recent Results (from the past 240 hour(s))  CLOSTRIDIUM DIFFICILE BY PCR     Status: None   Collection Time    04/02/13  7:04 PM      Result Value Range Status   C difficile by pcr NEGATIVE  NEGATIVE Final     Studies: Dg Chest 2 View  04/02/2013   *RADIOLOGY REPORT*  Clinical Data: Dehydration; history of asthma  CHEST - 2 VIEW  Comparison: Chest radiograph November 13, 2008 and chest CT February 11, 2009.  Findings: There is no edema or consolidation.  Heart size and pulmonary vascularity are normal.  No adenopathy.  No bone lesions.  IMPRESSION: No edema or consolidation.   Original Report Authenticated By: Lowella Grip, M.D.    Scheduled Meds: . DULoxetine  60 mg Oral Daily  . heparin  5,000 Units Subcutaneous Q8H  . metoprolol succinate  50 mg Oral Daily  . mometasone-formoterol  2 puff Inhalation BID  . pantoprazole  40 mg Oral BID  . sodium chloride  3 mL Intravenous Q12H   Continuous Infusions: . sodium chloride 125 mL/hr at 04/03/13 7408    Active Problems:   HYPONATREMIA   LEUKOCYTOSIS   Chronic kidney disease (CKD), stage II (mild)   Acute on chronic renal failure    Time spent: 35 minutes.      [name]  [facility] Pager [phone]. If 7PM-7AM, please contact night-coverage at [email or web address], password [facility] 04/03/2013, 9:36 AM  LOS: 1 day

## 2013-04-03 NOTE — Progress Notes (Signed)
Nutrition Brief Note  Patient identified on the Malnutrition Screening Tool (MST) Report  Body mass index is 30.52 kg/(m^2). Patient meets criteria for Obese based on current BMI. Pt reports that she usually weighs 180 lbs now; she used to weigh 140 lbs and she has gained weight since starting Remicade.  Current diet order is Renal [id number], patient is consuming approximately 100% of meals at this time. Pt reports her appetite is good and she is eating normally. Discussed tips for healthy weight loss. Labs and medications reviewed.   No nutrition interventions warranted at this time. If nutrition issues arise, please consult RD.   Pryor Ochoa RD, LDN Inpatient Clinical Dietitian Pager: [phone] After Hours Pager: [phone]

## 2013-04-04 DIAGNOSIS — D649 Anemia, unspecified: Secondary | ICD-10-CM

## 2013-04-04 DIAGNOSIS — K5190 Ulcerative colitis, unspecified, without complications: Secondary | ICD-10-CM

## 2013-04-04 LAB — CBC
HCT: 31.4 % — ABNORMAL LOW (ref 36.0–46.0)
Hemoglobin: 10.4 g/dL — ABNORMAL LOW (ref 12.0–15.0)
MCH: 32.1 pg (ref 26.0–34.0)
MCHC: 33.1 g/dL (ref 30.0–36.0)
MCV: 96.9 fL (ref 78.0–100.0)
Platelets: 342 10*3/uL (ref 150–400)
RBC: 3.24 MIL/uL — ABNORMAL LOW (ref 3.87–5.11)
RDW: 14.3 % (ref 11.5–15.5)
WBC: 8.8 10*3/uL (ref 4.0–10.5)

## 2013-04-04 LAB — BASIC METABOLIC PANEL
BUN: 23 mg/dL (ref 6–23)
CO2: 16 mEq/L — ABNORMAL LOW (ref 19–32)
Calcium: 8.5 mg/dL (ref 8.4–10.5)
Chloride: 110 mEq/L (ref 96–112)
Creatinine, Ser: 1.91 mg/dL — ABNORMAL HIGH (ref 0.50–1.10)
GFR calc Af Amer: 32 mL/min — ABNORMAL LOW (ref >90)
GFR calc non Af Amer: 28 mL/min — ABNORMAL LOW (ref >90)
Glucose, Bld: 139 mg/dL — ABNORMAL HIGH (ref 70–99)
Potassium: 3.2 mEq/L — ABNORMAL LOW (ref 3.5–5.1)
Sodium: 139 mEq/L (ref 135–145)

## 2013-04-04 MED ORDER — POTASSIUM CHLORIDE CRYS ER 20 MEQ PO TBCR
40.0000 meq | EXTENDED_RELEASE_TABLET | Freq: Once | ORAL | Status: AC
  Administered 2013-04-04: 40 meq via ORAL
  Filled 2013-04-04 (×2): qty 2

## 2013-04-04 NOTE — Progress Notes (Signed)
[facility] PROGRESS NOTE  [name] EHM:[id number] DOB: 12/28/1955 DOA: 04/02/2013 PCP: Garnet Koyanagi, DO  Assessment/Plan: 1-Acute on Chronic Renal Failure: Patient Cr baseline 1.3 to 1.7. Likely secondary to I/O mismatch from increase out put from J pouch. Patient received 2 L IV fluids in the ED, then NS at 125 cc/hour, will cut down to 75cc/hr. Cr trending down at 1.9.  Bmet in am  2-Leukocytosis: Could be hemoconcentration and reactive, resolved  3-Hyponatremia: resolved with hydration  4-Metabolic acidosis: in setting GI loose and renal failure. IV fluids. No hypotension.  lactic acid normal, continue sodium bicarb tablet today . Marland Kitchen  5-History of Ulcerative colitis: hold remicade, FU with Dr.[name]  Code Status: Full  Family Communication: Care discussed with patient.  Disposition Plan: home tomorrow, DC tele    Consultants:  none  Procedures: none  Antibiotics:  none  HPI/Subjective: Feeling better. Decrease output from J pouch.  Feeling stronger.   Objective: Filed Vitals:   04/04/13 0500 04/04/13 0549 04/04/13 0925 04/04/13 1328  BP:  133/81  136/80  Pulse:  10  98  Temp:  98.2 F (36.8 C)    TempSrc:  Oral    Resp:  18    Height:      Weight: 81.6 kg (179 lb 14.3 oz)     SpO2:  99% 98%     Intake/Output Summary (Last 24 hours) at 04/04/13 1408 Last data filed at 04/04/13 0700  Gross per 24 hour  Intake   3240 ml  Output   1500 ml  Net   1740 ml   Filed Weights   04/02/13 1650 04/03/13 0615 04/04/13 0500  Weight: 80.332 kg (177 lb 1.6 oz) 80.7 kg (177 lb 14.6 oz) 81.6 kg (179 lb 14.3 oz)    Exam:   General:  No distress.   Cardiovascular: S 1, S 2 RRR  Respiratory: CTA  Abdomen: BS present, soft, NT  Musculoskeletal: no edema.    Data Reviewed: Basic Metabolic Panel:  Recent Labs Lab 04/02/13 1425 04/03/13 0440 04/04/13 1000  NA 131* 137 139  K 4.7 3.9 3.2*  CL 99 110 110  CO2 15* 14* 16*  GLUCOSE 103* 101*  139*  BUN 47* 41* 23  CREATININE 3.76* 2.80* 1.91*  CALCIUM 10.6* 9.0 8.5   Liver Function Tests:  Recent Labs Lab 04/02/13 1425  AST 19  ALT 19  ALKPHOS 77  BILITOT 0.5  PROT 8.8*  ALBUMIN 4.1   No results found for this basename: LIPASE, AMYLASE,  in the last 168 hours No results found for this basename: AMMONIA,  in the last 168 hours CBC:  Recent Labs Lab 04/02/13 1425 04/03/13 0440 04/04/13 1000  WBC 16.8* 9.5 8.8  NEUTROABS 12.1*  --   --   HGB 13.0 10.8* 10.4*  HCT 39.4 33.4* 31.4*  MCV 95.9 96.3 96.9  PLT 508* 340 342   Cardiac Enzymes: No results found for this basename: CKTOTAL, CKMB, CKMBINDEX, TROPONINI,  in the last 168 hours BNP (last 3 results) No results found for this basename: PROBNP,  in the last 8760 hours CBG: No results found for this basename: GLUCAP,  in the last 168 hours  Recent Results (from the past 240 hour(s))  CLOSTRIDIUM DIFFICILE BY PCR     Status: None   Collection Time    04/02/13  7:04 PM      Result Value Range Status   C difficile by pcr NEGATIVE  NEGATIVE Final  Studies: Dg Chest 2 View  04/02/2013   *RADIOLOGY REPORT*  Clinical Data: Dehydration; history of asthma  CHEST - 2 VIEW  Comparison: Chest radiograph November 13, 2008 and chest CT February 11, 2009.  Findings: There is no edema or consolidation.  Heart size and pulmonary vascularity are normal.  No adenopathy.  No bone lesions.  IMPRESSION: No edema or consolidation.   Original Report Authenticated By: Lowella Grip, M.D.    Scheduled Meds: . DULoxetine  120 mg Oral Daily  . metoprolol succinate  50 mg Oral Daily  . mometasone-formoterol  2 puff Inhalation BID  . pantoprazole  40 mg Oral BID  . potassium chloride  40 mEq Oral Once  . sodium bicarbonate  650 mg Oral TID  . sodium chloride  3 mL Intravenous Q12H   Continuous Infusions: . sodium chloride 75 mL/hr at 04/04/13 1329    Active Problems:   HYPONATREMIA   LEUKOCYTOSIS   Chronic kidney disease  (CKD), stage II (mild)   Acute on chronic renal failure    Time spent: 35 minutes.     [facility]  [facility] Pager [phone]. If 7PM-7AM, please contact night-coverage at [email or web address], password [facility] 04/04/2013, 2:08 PM  LOS: 2 days

## 2013-04-05 LAB — BASIC METABOLIC PANEL
BUN: 20 mg/dL (ref 6–23)
CO2: 18 mEq/L — ABNORMAL LOW (ref 19–32)
Calcium: 8.9 mg/dL (ref 8.4–10.5)
Chloride: 111 mEq/L (ref 96–112)
Creatinine, Ser: 1.83 mg/dL — ABNORMAL HIGH (ref 0.50–1.10)
GFR calc Af Amer: 34 mL/min — ABNORMAL LOW (ref >90)
GFR calc non Af Amer: 29 mL/min — ABNORMAL LOW (ref >90)
Glucose, Bld: 100 mg/dL — ABNORMAL HIGH (ref 70–99)
Potassium: 4 mEq/L (ref 3.5–5.1)
Sodium: 139 mEq/L (ref 135–145)

## 2013-04-12 ENCOUNTER — Encounter (HOSPITAL_COMMUNITY)
Admission: RE | Admit: 2013-04-12 | Discharge: 2013-04-12 | Disposition: A | Discharge status: Home / Self Care | Payer: Medicare Other | Source: Ambulatory Visit | Attending: Internal Medicine | Admitting: Internal Medicine

## 2013-04-12 ENCOUNTER — Encounter (HOSPITAL_COMMUNITY): Payer: Self-pay

## 2013-04-12 ENCOUNTER — Other Ambulatory Visit (HOSPITAL_COMMUNITY): Payer: Self-pay | Admitting: Nephrology

## 2013-04-12 VITALS — BP 119/73 | HR 99 | Temp 98.6°F | Resp 20 | Ht 64.0 in | Wt 178.2 lb

## 2013-04-12 DIAGNOSIS — K5190 Ulcerative colitis, unspecified, without complications: Secondary | ICD-10-CM | POA: Insufficient documentation

## 2013-04-12 LAB — COMPREHENSIVE METABOLIC PANEL
ALT: 14 U/L (ref 0–35)
AST: 16 U/L (ref 0–37)
Albumin: 3.4 g/dL — ABNORMAL LOW (ref 3.5–5.2)
Alkaline Phosphatase: 67 U/L (ref 39–117)
BUN: 30 mg/dL — ABNORMAL HIGH (ref 6–23)
CO2: 20 mEq/L (ref 19–32)
Calcium: 9.2 mg/dL (ref 8.4–10.5)
Chloride: 103 mEq/L (ref 96–112)
Creatinine, Ser: 2.18 mg/dL — ABNORMAL HIGH (ref 0.50–1.10)
GFR calc Af Amer: 27 mL/min — ABNORMAL LOW (ref >90)
GFR calc non Af Amer: 24 mL/min — ABNORMAL LOW (ref >90)
Glucose, Bld: 97 mg/dL (ref 70–99)
Potassium: 4 mEq/L (ref 3.5–5.1)
Sodium: 134 mEq/L — ABNORMAL LOW (ref 135–145)
Total Bilirubin: 0.2 mg/dL — ABNORMAL LOW (ref 0.3–1.2)
Total Protein: 7.3 g/dL (ref 6.0–8.3)

## 2013-04-12 LAB — URINALYSIS, ROUTINE W REFLEX MICROSCOPIC
Bilirubin Urine: NEGATIVE
Glucose, UA: NEGATIVE mg/dL
Hgb urine dipstick: NEGATIVE
Ketones, ur: NEGATIVE mg/dL
Nitrite: NEGATIVE
Protein, ur: NEGATIVE mg/dL
Specific Gravity, Urine: 1.022 (ref 1.005–1.030)
Urobilinogen, UA: 0.2 mg/dL (ref 0.0–1.0)
pH: 5.5 (ref 5.0–8.0)

## 2013-04-12 LAB — IRON AND TIBC
Iron: 42 ug/dL (ref 42–135)
Saturation Ratios: 16 % — ABNORMAL LOW (ref 20–55)
TIBC: 256 ug/dL (ref 250–470)
UIBC: 214 ug/dL (ref 125–400)

## 2013-04-12 LAB — PROTEIN / CREATININE RATIO, URINE
Creatinine, Urine: 218 mg/dL
Protein Creatinine Ratio: 0.09 (ref 0.00–0.15)
Total Protein, Urine: 20.7 mg/dL

## 2013-04-12 LAB — URINE MICROSCOPIC-ADD ON

## 2013-04-12 LAB — FERRITIN: Ferritin: 413 ng/mL — ABNORMAL HIGH (ref 10–291)

## 2013-04-12 MED ORDER — DIPHENHYDRAMINE HCL 25 MG PO CAPS
25.0000 mg | ORAL_CAPSULE | Freq: Once | ORAL | Status: AC
  Administered 2013-04-12: 25 mg via ORAL
  Filled 2013-04-12: qty 1

## 2013-04-12 MED ORDER — ACETAMINOPHEN 325 MG PO TABS
650.0000 mg | ORAL_TABLET | Freq: Once | ORAL | Status: AC
  Administered 2013-04-12: 650 mg via ORAL
  Filled 2013-04-12: qty 2

## 2013-04-12 MED ORDER — SODIUM CHLORIDE 0.9 % IV SOLN
INTRAVENOUS | Status: DC
  Administered 2013-04-12: 20 mL/h via INTRAVENOUS

## 2013-04-12 MED ORDER — SODIUM CHLORIDE 0.9 % IV SOLN
5.0000 mg/kg | Freq: Once | INTRAVENOUS | Status: AC
  Administered 2013-04-12: 400 mg via INTRAVENOUS
  Filled 2013-04-12: qty 40

## 2013-04-13 LAB — URINE CULTURE: Colony Count: 50000

## 2013-04-13 LAB — PTH, INTACT AND CALCIUM
Calcium, Total (PTH): 8.9 mg/dL (ref 8.4–10.5)
PTH: 150.2 pg/mL — ABNORMAL HIGH (ref 14.0–72.0)

## 2013-04-18 ENCOUNTER — Other Ambulatory Visit: Payer: Self-pay | Admitting: *Deleted

## 2013-04-18 ENCOUNTER — Other Ambulatory Visit: Payer: Self-pay | Admitting: Internal Medicine

## 2013-04-18 MED ORDER — HYDROCODONE-ACETAMINOPHEN 5-500 MG PO TABS: 1.0000 | ORAL_TABLET | Freq: Four times a day (QID) | ORAL | Status: DC | PRN

## 2013-04-25 NOTE — Discharge Summary (Addendum)
Physician Discharge Summary  [name] CVE:[id number] DOB: 1956-01-11 DOA: 04/02/2013  PCP: Garnet Koyanagi, DO  Admit date: 04/02/2013 Discharge date: 04/05/2013  Time spent: 50 minutes  Recommendations for Outpatient Follow-up:  1. PCP in 1 week 2. Bmet in 1 week  Discharge Diagnoses:  Active Problems:   HYPONATREMIA   LEUKOCYTOSIS   Chronic kidney disease (CKD), stage II (mild)   Acute on chronic renal failure   Ulcerative colitis  Discharge Condition: stable  Diet recommendation: low sodium  Filed Weights   04/03/13 0615 04/04/13 0500 04/05/13 0542  Weight: 80.7 kg (177 lb 14.6 oz) 81.6 kg (179 lb 14.3 oz) 81.647 kg (180 lb)    History of present illness:  [name] is a 57 y.o. female with PMH significant for ulcerative colitis, S/P total colectomy with internal J Pouch who presents to ED feeling dehydrated. She relates weakness on exertion, tired. She has notice increase output from her J pouch. She has been doing some gardening and working out site. She denies dysuria. She always has some loose stool   Hospital Course:  Acute on Chronic Renal Failure: Patient Cr baseline 1.3 to 1.7. Likely secondary to I/O mismatch from increase out put from J pouch. Patient received 2 L IV fluids in the ED, then NS at 125 cc/hour and then will cut down to 75cc/hr. Cr trending down at 1.8 at discharge, close to baseline.  advised to Keep up PO/fluid intake esp at times of increase output from pouch.  2-Leukocytosis: Could be hemoconcentration and reactive, resolved   3-Hyponatremia: resolved with hydration   4-Metabolic acidosis: due to 1, improved with rehydration . Marland Kitchen  5-History of Ulcerative colitis: hold remicade, FU with Dr.[name]   Discharge Exam: Filed Vitals:   04/05/13 0646  BP: 139/80  Pulse: 80  Temp: 97.7 F (36.5 C)  Resp: 18    General:AAOx3 Cardiovascular: S1S2/RRR Respiratory: CTAB  Discharge Instructions  Discharge Orders   Future  Appointments Provider Department Dept Phone   04/30/2013 8:15 AM Rosalita Chessman, [place] at  [place]   06/07/2013 9:00 AM [facility] [place] [phone]   08/06/2013 9:00 AM [facility] [place] [phone]   Future Orders Complete By Expires   Diet - low sodium heart healthy  As directed    Increase activity slowly  As directed        Medication List    STOP taking these medications       ciprofloxacin 250 MG tablet  Commonly known as:  CIPRO      TAKE these medications       albuterol 108 (90 BASE) MCG/ACT inhaler  Commonly known as:  PROAIR HFA  Inhale 2 puffs into the lungs every 6 (six) hours as needed for wheezing.     ALPRAZolam 0.25 MG tablet  Commonly known as:  XANAX  Take 1 tablet (0.25 mg total) by mouth daily as needed for anxiety.     BIOTIN 5000 PO  Take by mouth daily.     CYMBALTA 60 MG capsule  Generic drug:  DULoxetine  TAKE 2 TABLETS BY MOUTH ONCE  DAILY     diphenhydrAMINE 25 mg capsule  Commonly known as:  BENADRYL  Give one po every 8 weeks prior to Remicade infustion     diphenoxylate-atropine 2.5-0.025 MG per tablet  Commonly known as:  LOMOTIL  Take 1 tablet by mouth 3 (three) times daily as needed.  Fluticasone-Salmeterol 250-50 MCG/DOSE Aepb  Commonly known as:  ADVAIR DISKUS  Inhale 1 puff into the lungs 2 (two) times daily.     inFLIXimab 100 MG injection  Commonly known as:  REMICADE  - Infuse 5 mg/kg IV at 0 weeks, 2 weeks, 6 weeks and then every 8 weeks thereafter. Give Benadryl 25 mg 1 tablet by mouth and/or acetaminophen 650 mg by mouth before each infusion. Also add Ativan 0.5 mg 1 tablet by mouth before infusions.  - TB test: 06/09/11 Negative (0 mm)  - Ht: 5'4''  - Wt: 157 pounds   - Dx: Ulcerative Colitis     mesalamine 1000 MG suppository  Commonly known as:  CANASA  Place 1 suppository (1,000 mg total) rectally at bedtime.      metoprolol succinate 100 MG 24 hr tablet  Commonly known as:  TOPROL-XL  Take 0.5 Tablet Daily     MULTIVITAL Chew  Chew by mouth.     pantoprazole 40 MG tablet  Commonly known as:  PROTONIX  Take 1 tablet (40 mg total) by mouth 2 (two) times daily at 10 AM and 5 PM.       Allergies  Allergen Reactions  . Morphine   . Sulfonamide Derivatives        Follow-up Information   Follow up with COLADONATO,Elouise Divelbiss A, MD In 1 week. (with labs)    Specialty:  Nephrology   Contact information:   [place] [address] [phone]        The results of significant diagnostics from this hospitalization (including imaging, microbiology, ancillary and laboratory) are listed below for reference.    Significant Diagnostic Studies: Dg Chest 2 View  04/02/2013   *RADIOLOGY REPORT*  Clinical Data: Dehydration; history of asthma  CHEST - 2 VIEW  Comparison: Chest radiograph November 13, 2008 and chest CT February 11, 2009.  Findings: There is no edema or consolidation.  Heart size and pulmonary vascularity are normal.  No adenopathy.  No bone lesions.  IMPRESSION: No edema or consolidation.   Original Report Authenticated By: Lowella Grip, M.D.    Microbiology: No results found for this or any previous visit (from the past 240 hour(s)).   Labs: Basic Metabolic Panel: No results found for this basename: NA, K, CL, CO2, GLUCOSE, BUN, CREATININE, CALCIUM, MG, PHOS,  in the last 168 hours Liver Function Tests: No results found for this basename: AST, ALT, ALKPHOS, BILITOT, PROT, ALBUMIN,  in the last 168 hours No results found for this basename: LIPASE, AMYLASE,  in the last 168 hours No results found for this basename: AMMONIA,  in the last 168 hours CBC: No results found for this basename: WBC, NEUTROABS, HGB, HCT, MCV, PLT,  in the last 168 hours Cardiac Enzymes: No results found for this basename: CKTOTAL, CKMB, CKMBINDEX, TROPONINI,  in the last 168  hours BNP: BNP (last 3 results) No results found for this basename: PROBNP,  in the last 8760 hours CBG: No results found for this basename: GLUCAP,  in the last 168 hours     Signed:  Requan Hardge  [facility] 04/25/2013, 5:34 PM

## 2013-04-30 ENCOUNTER — Ambulatory Visit: Payer: Medicare Other | Admitting: Family Medicine

## 2013-05-03 ENCOUNTER — Ambulatory Visit (INDEPENDENT_AMBULATORY_CARE_PROVIDER_SITE_OTHER): Payer: Medicare Other | Admitting: Family Medicine

## 2013-05-03 ENCOUNTER — Encounter: Payer: Self-pay | Admitting: Family Medicine

## 2013-05-03 VITALS — BP 120/80 | HR 103 | Temp 99.0°F | Wt 180.0 lb

## 2013-05-03 DIAGNOSIS — F332 Major depressive disorder, recurrent severe without psychotic features: Secondary | ICD-10-CM

## 2013-05-03 MED ORDER — BUPROPION HCL ER (XL) 150 MG PO TB24: 150.0000 mg | ORAL_TABLET | Freq: Every day | ORAL | Status: DC

## 2013-05-03 NOTE — Progress Notes (Signed)
  Subjective:   [name] is an 57 y.o. female who presents for evaluation and treatment of depressive symptoms.  Onset approximately 1 month ago, gradually worsening since that time.  Current symptoms include depressed mood, fatigue, insomnia, loss of energy/fatigue, disturbed sleep, paranoid.  Current treatment for depression:Medication Sleep problems: Moderate   Early awakening:Absent   Energy: Poor Motivation: Poor Concentration: Poor Rumination/worrying: Moderate Memory: Fair Tearfulness: Severe  Anxiety: Moderate  Panic: Moderate  Overall Mood: Moderately worse  Hopelessness: Marked Suicidal ideation: Absent  Other/Psychosocial Stressors: had to put dog of 14y down Family history positive for depression in the patient's unknown.  Previous treatment modalities employed include Medication.  Past episodes of depression:been on meds for years Organic causes of depression present: None.  Review of Systems Pertinent items are noted in HPI.   Objective:   Mental Status Examination: Posture and motor behavior: Appropriate Dress, grooming, personal hygiene: Appropriate Facial expression: Appropriate Speech: Appropriate Mood: down Coherency and relevance of thought: Appropriate Thought content: Appropriate Perceptions: Appropriate Orientation:Appropriate Attention and concentration: poor Memory: : fair Information: Not examined Vocabulary: Appropriate Abstract reasoning: Appropriate Judgment: Appropriate    Assessment:   Experiencing the following symptoms of depression most of the day nearly every day for more than two consecutive weeks: depressed mood, change in sleep, loss of energy, trouble concentrating  Depressive Disorder:severe  Suicide Risk Assessment:  Suicidal intent: no Suicidal plan: no Access to means for suicide:no  Lethality of means for suicide: no Prior suicide attempts: no Recent exposure to suicide:no     Plan:   severe  depression   F/u psych Add wellbutrin xl 150 mg qd------pt concerned about weight gain Reviewed concept of depression as biochemical imbalance of neurotransmitters and rationale for treatment. Instructed patient to contact office or on-call physician promptly should condition worsen or any new symptoms appear and provided on-call telephone numbers.

## 2013-05-03 NOTE — Patient Instructions (Signed)

## 2013-05-10 ENCOUNTER — Other Ambulatory Visit: Payer: Self-pay | Admitting: Internal Medicine

## 2013-05-31 ENCOUNTER — Telehealth: Payer: Self-pay | Admitting: General Practice

## 2013-05-31 DIAGNOSIS — F332 Major depressive disorder, recurrent severe without psychotic features: Secondary | ICD-10-CM

## 2013-05-31 MED ORDER — BUPROPION HCL ER (XL) 150 MG PO TB24: 150.0000 mg | ORAL_TABLET | Freq: Every day | ORAL | Status: DC

## 2013-05-31 NOTE — Telephone Encounter (Signed)
Ok to send in 90 day supply

## 2013-05-31 NOTE — Telephone Encounter (Signed)
buPROPion (WELLBUTRIN XL) 150 MG 24 hr tablet Refill request from [facility] requesting a 90 day supply of this medication. Med last filled on 05/03/13 #30 with 5 refills. Ok to change Rx?

## 2013-05-31 NOTE — Telephone Encounter (Signed)
Med filled and other Rx cancelled.

## 2013-06-05 ENCOUNTER — Other Ambulatory Visit: Payer: Self-pay | Admitting: Internal Medicine

## 2013-06-05 DIAGNOSIS — K5190 Ulcerative colitis, unspecified, without complications: Secondary | ICD-10-CM

## 2013-06-07 ENCOUNTER — Encounter (HOSPITAL_COMMUNITY): Payer: Self-pay

## 2013-06-07 ENCOUNTER — Encounter (HOSPITAL_COMMUNITY)
Admission: RE | Admit: 2013-06-07 | Discharge: 2013-06-07 | Disposition: A | Discharge status: Home / Self Care | Payer: Medicare Other | Source: Ambulatory Visit | Attending: Internal Medicine | Admitting: Internal Medicine

## 2013-06-07 VITALS — BP 124/79 | HR 94 | Temp 98.6°F | Resp 16 | Wt 177.1 lb

## 2013-06-07 DIAGNOSIS — K5190 Ulcerative colitis, unspecified, without complications: Secondary | ICD-10-CM

## 2013-06-07 MED ORDER — SODIUM CHLORIDE 0.9 % IV SOLN
5.0000 mg/kg | INTRAVENOUS | Status: DC
  Administered 2013-06-07: 400 mg via INTRAVENOUS
  Filled 2013-06-07: qty 40

## 2013-06-07 MED ORDER — ACETAMINOPHEN 325 MG PO TABS
650.0000 mg | ORAL_TABLET | Freq: Every day | ORAL | Status: DC
  Administered 2013-06-07: 650 mg via ORAL
  Filled 2013-06-07: qty 2

## 2013-06-07 MED ORDER — SODIUM CHLORIDE 0.9 % IV SOLN
INTRAVENOUS | Status: DC
  Administered 2013-06-07: 09:00:00 via INTRAVENOUS

## 2013-06-07 MED ORDER — DIPHENHYDRAMINE HCL 25 MG PO TABS
25.0000 mg | ORAL_TABLET | ORAL | Status: DC
  Administered 2013-06-07: 25 mg via ORAL
  Filled 2013-06-07: qty 1

## 2013-06-21 ENCOUNTER — Other Ambulatory Visit: Payer: Self-pay | Admitting: Internal Medicine

## 2013-06-23 ENCOUNTER — Other Ambulatory Visit: Payer: Self-pay | Admitting: Cardiovascular Disease

## 2013-06-26 ENCOUNTER — Other Ambulatory Visit: Payer: Self-pay | Admitting: General Practice

## 2013-06-26 DIAGNOSIS — F32A Depression, unspecified: Secondary | ICD-10-CM

## 2013-06-26 DIAGNOSIS — F329 Major depressive disorder, single episode, unspecified: Secondary | ICD-10-CM

## 2013-06-26 MED ORDER — CYMBALTA 60 MG PO CPEP: ORAL_CAPSULE | ORAL | Status: DC

## 2013-06-26 NOTE — Telephone Encounter (Signed)
Med filled.  

## 2013-07-12 ENCOUNTER — Other Ambulatory Visit: Payer: Self-pay

## 2013-07-17 ENCOUNTER — Other Ambulatory Visit: Payer: Self-pay | Admitting: Internal Medicine

## 2013-07-27 ENCOUNTER — Other Ambulatory Visit: Payer: Self-pay | Admitting: Cardiovascular Disease

## 2013-07-30 ENCOUNTER — Telehealth: Payer: Self-pay | Admitting: Internal Medicine

## 2013-07-30 MED ORDER — HYDROCODONE-ACETAMINOPHEN 5-500 MG PO TABS: 1.0000 | ORAL_TABLET | Freq: Four times a day (QID) | ORAL | Status: DC | PRN

## 2013-07-30 NOTE — Telephone Encounter (Signed)
rx created and awaiting Dr [name]'s signature. 

## 2013-08-02 ENCOUNTER — Encounter (HOSPITAL_COMMUNITY): Payer: Medicare Other

## 2013-08-06 ENCOUNTER — Encounter (HOSPITAL_COMMUNITY)
Admission: RE | Admit: 2013-08-06 | Discharge: 2013-08-06 | Disposition: A | Discharge status: Home / Self Care | Payer: Medicare Other | Source: Ambulatory Visit | Attending: Internal Medicine | Admitting: Internal Medicine

## 2013-08-06 ENCOUNTER — Encounter (HOSPITAL_COMMUNITY): Payer: Self-pay

## 2013-08-06 VITALS — BP 135/81 | HR 95 | Temp 98.3°F | Resp 18 | Ht 64.0 in | Wt 181.0 lb

## 2013-08-06 DIAGNOSIS — K5190 Ulcerative colitis, unspecified, without complications: Secondary | ICD-10-CM

## 2013-08-06 MED ORDER — SODIUM CHLORIDE 0.9 % IV SOLN
5.0000 mg/kg | INTRAVENOUS | Status: DC
  Administered 2013-08-06: 400 mg via INTRAVENOUS
  Filled 2013-08-06: qty 40

## 2013-08-06 MED ORDER — ACETAMINOPHEN 325 MG PO TABS
650.0000 mg | ORAL_TABLET | ORAL | Status: DC
  Administered 2013-08-06: 650 mg via ORAL
  Filled 2013-08-06: qty 2

## 2013-08-06 MED ORDER — DIPHENHYDRAMINE HCL 25 MG PO CAPS
25.0000 mg | ORAL_CAPSULE | ORAL | Status: DC
  Administered 2013-08-06: 25 mg via ORAL
  Filled 2013-08-06: qty 1

## 2013-08-06 MED ORDER — DIPHENHYDRAMINE HCL 25 MG PO TABS: 25.0000 mg | ORAL_TABLET | ORAL | Status: DC

## 2013-08-06 MED ORDER — SODIUM CHLORIDE 0.9 % IV SOLN
INTRAVENOUS | Status: DC
  Administered 2013-08-06: 09:00:00 via INTRAVENOUS

## 2013-08-22 ENCOUNTER — Ambulatory Visit (INDEPENDENT_AMBULATORY_CARE_PROVIDER_SITE_OTHER): Payer: Medicare Other | Admitting: Family Medicine

## 2013-08-22 ENCOUNTER — Encounter: Payer: Self-pay | Admitting: Family Medicine

## 2013-08-22 VITALS — BP 130/80 | HR 97 | Temp 98.8°F | Wt 186.0 lb

## 2013-08-22 DIAGNOSIS — K5190 Ulcerative colitis, unspecified, without complications: Secondary | ICD-10-CM

## 2013-08-22 MED ORDER — HYDROCODONE-ACETAMINOPHEN 5-325 MG PO TABS: 1.0000 | ORAL_TABLET | Freq: Four times a day (QID) | ORAL | Status: DC | PRN

## 2013-08-22 NOTE — Progress Notes (Signed)
Pre visit review using our clinic review tool, if applicable. No additional management support is needed unless otherwise documented below in the visit note. 

## 2013-08-22 NOTE — Progress Notes (Signed)
   Subjective:    Patient ID: [name], female    DOB: 1955-10-06, 57 y.o.   MRN: [id number]  HPI Pt originally made appointment for groin pain but it has resolved.   Pt would like [place] to take over pain meds from GI.  She gets 20 pills and it lasts 60 days normally.   Review of Systems As above    Objective:   Physical Exam  BP 130/80  Pulse 97  Temp(Src) 98.8 F (37.1 C) (Oral)  Wt 186 lb (84.369 kg)  SpO2 97% General appearance: alert, cooperative, appears stated age and no distress Lungs: clear to auscultation bilaterally Heart: S1, S2 normal Extremities: extremities normal, atraumatic, no cyanosis or edema       Assessment & Plan:

## 2013-08-22 NOTE — Patient Instructions (Signed)
Pain Medicine Instructions You have been given a prescription for pain medicines. These medicines may affect your ability to think clearly. They may also affect your ability to perform physical activities. Take these medicines only as needed for pain. You do not need to take them if you are not having pain, unless directed by your caregiver. You can take less than the prescribed dose if you find a smaller amount of medicine controls the pain. It may not be possible to make all of your pain go away, but you should be comfortable enough to move, breathe, and take care of yourself. After you start taking pain medicines, while taking the medicines, and for 8 hours after stopping the medicines:  Do not drive.  Do not operate machinery.  Do not operate power tools.  Do not sign legal documents.  Do not supervise children by yourself.  Do not participate in activities that require climbing or being in high places.  Do not enter a body of water (lake, river, ocean, spa, swimming pool) without an adult nearby who can help you. You may have been prescribed a pain medicine that contains acetaminophen (paracetamol). If so, take only the amount directed by your caregiver. Do not take any other acetaminophen while taking this medicine. An overdose of acetaminophen can result in severe liver damage. If you are taking other medicines, check the active ingredients for acetaminophen. Acetaminophen is found in hundreds of over-the-counter and prescription medicines. These include cold relief products, menstrual cramp relief medicines, fever-reducing medicines, acid indigestion relief products, and pain relief products. HOME CARE INSTRUCTIONS   Do not drink alcohol, take sleeping pills, or take other medicines until at least 8 hours after your last dose of pain medicine, or as directed by your caregiver.  Use a bulk stool softener if you become constipated from your pain medicines. Increasing your intake of fruits  and vegetables will also help.  Write down the times when you take your medicines. Look at the times before taking your next dose of medicine. It is easy to become confused while on pain medicines. Recording the times helps you to avoid an overdose. SEEK MEDICAL CARE IF:  Your medicine is not helping the pain go away.  You vomit or have diarrhea shortly after taking the medicine.  You develop new pain in areas that did not hurt before. SEEK IMMEDIATE MEDICAL CARE IF:  You feel dizzy or faint.  You feel there are other problems that might be caused by your medicine. MAKE SURE YOU:   Understand these instructions.  Will watch your condition.  Will get help right away if you are not doing well or get worse. Document Released: 11/29/2000 Document Revised: 12/18/2012 Document Reviewed: 08/07/2010 [facility] Patient Information 2014 [place], [place].

## 2013-08-22 NOTE — Assessment & Plan Note (Signed)
Pain med refilled [facility] today

## 2013-08-25 ENCOUNTER — Other Ambulatory Visit: Payer: Self-pay | Admitting: Cardiovascular Disease

## 2013-08-27 ENCOUNTER — Other Ambulatory Visit: Payer: Self-pay | Admitting: Internal Medicine

## 2013-08-27 ENCOUNTER — Other Ambulatory Visit: Payer: Self-pay

## 2013-08-27 MED ORDER — METOPROLOL SUCCINATE ER 100 MG PO TB24: ORAL_TABLET | ORAL | Status: DC

## 2013-09-14 ENCOUNTER — Encounter: Payer: Self-pay | Admitting: Internal Medicine

## 2013-09-14 ENCOUNTER — Ambulatory Visit (INDEPENDENT_AMBULATORY_CARE_PROVIDER_SITE_OTHER): Payer: Medicare Other | Admitting: Internal Medicine

## 2013-09-14 VITALS — BP 122/84 | HR 68 | Ht 64.0 in | Wt 186.6 lb

## 2013-09-14 DIAGNOSIS — K5190 Ulcerative colitis, unspecified, without complications: Secondary | ICD-10-CM

## 2013-09-14 DIAGNOSIS — D849 Immunodeficiency, unspecified: Secondary | ICD-10-CM

## 2013-09-14 MED ORDER — DIPHENOXYLATE-ATROPINE 2.5-0.025 MG PO TABS: 1.0000 | ORAL_TABLET | Freq: Three times a day (TID) | ORAL | Status: DC | PRN

## 2013-09-14 MED ORDER — TEMAZEPAM 30 MG PO CAPS: 30.0000 mg | ORAL_CAPSULE | Freq: Every evening | ORAL | Status: DC | PRN

## 2013-09-14 NOTE — Patient Instructions (Addendum)
We have given you a TB skin test today. Please make certain to come back to the office for a reading between 48-72 hours from now to avoid requiring repeat testing.  Please discontinue Xanax.  We have sent the following medications to your pharmacy for you to pick up at your convenience: Restoril  CC:Dr [facility]

## 2013-09-14 NOTE — Progress Notes (Signed)
[name] June 15, 1956 [id number]   History of Present Illness:  This is a 58 year old, white female with ulcerative colitis since 11. She had a total colectomy and  ileoproctostomy in 1998. The ileocolic anastomosis is at 20 cm. She has a 5 cm pouch as per her last flexible sigmoidoscopy in September 2012. She had friable mucosa at the anastomosis and hemorrhoids, but the pouch looked OK. She had mild stenosis at the anastomosis which was dilated with 45 French Savary dilator. She has responded well to Remicade 5 mg/kg every 8 weeks. She has occasional flareups of diarrhea and what feels like obstructive symptoms which eventually resolve spontaneously on bowel rest. She has chronic renal insufficiency followed by Dr Corliss Blacker  and gastroesophageal reflux disease as well as iron deficiency necessitating iron infusions. Her last appointment was in June 2014. She is doing reasonably well with only a few episodes of obstructive symptoms.    Past Medical History  Diagnosis Date  . History of small bowel obstruction   . Obesity   . Hyperparathyroidism   . Hypokalemia   . Hypertension   . Gastric polyp   . GERD (gastroesophageal reflux disease)   . Depression   . Anxiety   . Anemia   . Renal insufficiency   . Ulcerative colitis   . RLS (restless legs syndrome)   . Pulmonary nodule, right     right upper lobe  . Anal stenosis   . Esophagitis   . De Quervain's tenosynovitis   . Hyperlipidemia   . Fibroid   . History of cervical dysplasia   . Asthma   . Hearing loss   . Visual disturbance   . Abdominal pain   . Rectal bleeding   . Blood in stool   . Rectal pain   . Difficulty urinating   . Blood in urine   . Fainting     due to dehydration  . Weakness generalized   . Fatigue   . Easy bruising   . Hemorrhoids, internal   . Hemorrhoids, external   . Ovarian cyst   . Herpes, genital     vaginal treated 07/05/12 and pt states is resolved    Past Surgical History   Procedure Laterality Date  . Anal dilation    . 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 Ileostomy closure    . Cholecystectomy    . Tubal ligation    . Hemorrhoid surgery    . Fatty tumor removed from back    . Total abdominal hysterectomy  1998    TAH/LSO  . Cervical biopsy  w/ loop electrode excision    . Upper gastrointestinal endoscopy      Allergies  Allergen Reactions  . Morphine   . Sulfonamide Derivatives     Family history and social history have been reviewed.  Review of Systems: Denies dysphagia, heartburn, abdominal pain  The remainder of the 10 point ROS is negative except as outlined in the H&P  Physical Exam: General Appearance Well developed, in no distress Eyes  Non icteric  HEENT  Non traumatic, normocephalic  Mouth No lesion, tongue papillated, no cheilosis Neck Supple without adenopathy, thyroid not enlarged, no carotid bruits, no JVD Lungs Clear to auscultation bilaterally COR Normal S1, normal S2, regular rhythm, no murmur, quiet precordium Abdomen soft but mildly tender across the lower abdomen with normoactive bowel sounds. No distention. Well-healed surgical scar. Stretch marks Rectal not  done Extremities  No pedal edema Skin No lesions Neurological Alert and oriented x 3 Psychological Normal mood and affect  Assessment and Plan:   Problem #1 Ulcerative colitis in remission. Patient is post total colectomy and ileoproctostomy with anastomosis at 20 cm. We will have her to continue Remicade infusions 5 mg/kg every 8 weeks. She has received her TB skin test today. She needs a refill on Lomotil.  Problem #2 Insomnia. She would like to switch from Xanax to a sleeping medication. We will call in Restoril 30 mg at bedtime.( does not want Ambien).    [name] 09/14/2013

## 2013-09-15 ENCOUNTER — Encounter: Payer: Self-pay | Admitting: Internal Medicine

## 2013-09-17 LAB — TB SKIN TEST
Induration: 0 mm
TB Skin Test: NEGATIVE

## 2013-09-19 ENCOUNTER — Other Ambulatory Visit: Payer: Self-pay | Admitting: Family Medicine

## 2013-09-19 ENCOUNTER — Telehealth: Payer: Self-pay | Admitting: *Deleted

## 2013-09-19 MED ORDER — OSELTAMIVIR PHOSPHATE 75 MG PO CAPS: 75.0000 mg | ORAL_CAPSULE | Freq: Two times a day (BID) | ORAL | Status: DC

## 2013-09-19 NOTE — Telephone Encounter (Signed)
Yes-- tamiflu 75 mg bid for 5 days

## 2013-09-19 NOTE — Telephone Encounter (Signed)
Patient called and stated that she has been around someone who was tested positive for flu. She states that today she has started coughing. She would like to know if she should go ahead and begin Tamiflu. Please advise. JG//CMA

## 2013-09-19 NOTE — Telephone Encounter (Signed)
i sent rx in

## 2013-09-25 ENCOUNTER — Telehealth: Payer: Self-pay

## 2013-09-25 NOTE — Telephone Encounter (Signed)
SPOKE WITH PATIENT ABOUT APPT. PATIENT STATES APPT IS FOR YEARLY CHECKUP AND NEEDS REFILLS. REMINDED PATIENT OF APPT DATE AND TIME.

## 2013-09-27 ENCOUNTER — Encounter: Payer: Self-pay | Admitting: Gynecology

## 2013-09-27 ENCOUNTER — Ambulatory Visit (INDEPENDENT_AMBULATORY_CARE_PROVIDER_SITE_OTHER): Payer: Medicare Other | Admitting: Gynecology

## 2013-09-27 VITALS — BP 134/88 | Ht 63.5 in | Wt 185.0 lb

## 2013-09-27 DIAGNOSIS — N83201 Unspecified ovarian cyst, right side: Secondary | ICD-10-CM

## 2013-09-27 DIAGNOSIS — Z8639 Personal history of other endocrine, nutritional and metabolic disease: Secondary | ICD-10-CM

## 2013-09-27 DIAGNOSIS — N83209 Unspecified ovarian cyst, unspecified side: Secondary | ICD-10-CM

## 2013-09-27 DIAGNOSIS — Z01419 Encounter for gynecological examination (general) (routine) without abnormal findings: Secondary | ICD-10-CM

## 2013-09-27 NOTE — Progress Notes (Signed)
[name] 1956-06-02 [id number]   History:    58 y.o.  for annual gyn exam with extensive past medical history. In 2013 when she was being evaluated by her urologist for nephrolithiasis a CT scan had demonstrated a right ovarian cyst measured 2.8 cm. Patient with prior history of total abdominal hysterectomy with left salpingo-oophorectomy. The ultrasound done in office on 02/14/2013 demonstrated the cyst to measure 3.7 x 3.8 x 2.5 cm irregular-shaped thinwall with scattered low-level echoes. She had a normal CA 125 and had received Depo-Provera 150 mg IM and on followup ultrasound July 2013 demonstrated a cyst measuring 3.5 x 2.4 x 3.7 cm thin echo free totally avascular.  Patient has had history of ulcerative colitis since 1990 and has been followed by Dr. Maurene Capes.She underwent a total colectomy and ileoproctostomy in 1998 for intractable disease. She has a history of pouchitis. Her last flexible sigmoidoscopy in September 2012 showed active inflammatory bowel disease at the anastomosis at 20 cm.  She has been followed for renal insufficiency by Dr Servando Salina.   She had received an additional Depo-Provera shot in January 2014 the ultrasound demonstrated a cyst gotten smaller measuring 29 x 21 x 31 mm ever size 2.7 mm.  CA 125 in 2000  at a value of 7.3 and on repeat in 2014 had a value of 8.1.   Patient had been referred to the endocrinologist Dr.Ballen  in the past secondary to hyperparathyroidism with normal calcium vitamin D level it was attributed to her renal insufficiency history. She takes her calcium and vitamin D when she remembers. Her bone density study indicated in 2014 and she had osteopenia.  Patient with no prior history of abnormal Pap smears prior to her hysterectomy. Patient's flu vaccine, Pneumovax vaccine, Tdap vaccine are up-to-date she has not received the she will vaccine yet.    colonoscopy 2012 Past medical history,surgical history, family history and social  history were all reviewed and documented in the EPIC chart.  Gynecologic History No LMP recorded. Patient has had a hysterectomy. Contraception: status post hysterectomy Last Pap: 2012 . Results were: normal Last mammogram: 2014 absent . Results were: normal  Obstetric History OB History  Gravida Para Term Preterm AB SAB TAB Ectopic Multiple Living  4 2 2  2 2    2     # Outcome Date GA Lbr Len/2nd Weight Sex Delivery Anes PTL Lv  4 SAB           3 SAB           2 TRM     F SVD  N Y  1 TRM     M SVD  N Y       ROS: A ROS was performed and pertinent positives and negatives are included in the history.  GENERAL: No fevers or chills. HEENT: No change in vision, no earache, sore throat or sinus congestion. NECK: No pain or stiffness. CARDIOVASCULAR: No chest pain or pressure. No palpitations. PULMONARY: No shortness of breath, cough or wheeze. GASTROINTESTINAL: No abdominal pain, nausea, vomiting or diarrhea, melena or bright red blood per rectum. GENITOURINARY: No urinary frequency, urgency, hesitancy or dysuria. MUSCULOSKELETAL: No joint or muscle pain, no back pain, no recent trauma. DERMATOLOGIC: No rash, no itching, no lesions. ENDOCRINE: No polyuria, polydipsia, no heat or cold intolerance. No recent change in weight. HEMATOLOGICAL: No anemia or easy bruising or bleeding. NEUROLOGIC: No headache, seizures, numbness, tingling or weakness. PSYCHIATRIC: No depression, no loss of interest  in normal activity or change in sleep pattern.     Exam: chaperone present  BP 134/88  Ht 5' 3.5" (1.613 m)  Wt 185 lb (83.915 kg)  BMI 32.25 kg/m2  Body mass index is 32.25 kg/(m^2).  General appearance : Well developed well nourished female. No acute distress HEENT: Neck supple, trachea midline, no carotid bruits, no thyroidmegaly Lungs: Clear to auscultation, no rhonchi or wheezes, or rib retractions  Heart: Regular rate and rhythm, no murmurs or gallops Breast:Examined in sitting and supine  position were symmetrical in appearance, no palpable masses or tenderness,  no skin retraction, no nipple inversion, no nipple discharge, no skin discoloration, no axillary or supraclavicular lymphadenopathy Abdomen: no palpable masses or tenderness, no rebound or guarding Extremities: no edema or skin discoloration or tenderness  Pelvic:  Bartholin, Urethra, Skene Glands: Within normal limits             Vagina: No gross lesions or discharge  Cervix: absent   Uterus   absent  Adnexa  Without masses or tenderness  Anus and perineum  normal   Rectovaginal  normal sphincter tone without palpated masses or tenderness             Hemoccult  PCP provides      Assessment/Plan:  58 y.o. female for annual exam who for several years has had a small simple ovarian cyst of the right ovary which we have been following. We have discussed in the past but due to her multiple abdominal surgeries  that the risk outweigh the benefits for surgical intervention with the findings of a small appearing cyst and  that we will continue to monitor with yearly ultrasounds and CA 125.  Note: This dictation was prepared with  Dragon/digital dictation along withSmart phrase technology. Any transcriptional errors that result from this process are unintentional.   Terrance Mass MD, 2:39 PM 09/27/2013

## 2013-09-27 NOTE — Patient Instructions (Signed)

## 2013-09-28 LAB — CA 125: CA 125: 9.6 U/mL (ref 0.0–30.2)

## 2013-10-02 ENCOUNTER — Other Ambulatory Visit: Payer: Self-pay | Admitting: *Deleted

## 2013-10-02 DIAGNOSIS — K5190 Ulcerative colitis, unspecified, without complications: Secondary | ICD-10-CM

## 2013-10-04 ENCOUNTER — Encounter (HOSPITAL_COMMUNITY): Payer: Self-pay

## 2013-10-04 ENCOUNTER — Encounter (HOSPITAL_COMMUNITY)
Admission: RE | Admit: 2013-10-04 | Discharge: 2013-10-04 | Disposition: A | Discharge status: Home / Self Care | Payer: Medicare Other | Source: Ambulatory Visit | Attending: Internal Medicine | Admitting: Internal Medicine

## 2013-10-04 VITALS — BP 132/78 | HR 85 | Temp 97.9°F | Resp 20 | Ht 63.5 in | Wt 184.0 lb

## 2013-10-04 DIAGNOSIS — K5190 Ulcerative colitis, unspecified, without complications: Secondary | ICD-10-CM | POA: Insufficient documentation

## 2013-10-04 MED ORDER — DIPHENHYDRAMINE HCL 25 MG PO CAPS
25.0000 mg | ORAL_CAPSULE | ORAL | Status: DC
  Administered 2013-10-04: 25 mg via ORAL
  Filled 2013-10-04: qty 1

## 2013-10-04 MED ORDER — ACETAMINOPHEN 325 MG PO TABS
650.0000 mg | ORAL_TABLET | ORAL | Status: DC
  Administered 2013-10-04: 650 mg via ORAL
  Filled 2013-10-04 (×2): qty 2

## 2013-10-04 MED ORDER — SODIUM CHLORIDE 0.9 % IV SOLN
INTRAVENOUS | Status: DC
Start: 2013-10-04 — End: 2013-10-05
  Administered 2013-10-04: 09:00:00 via INTRAVENOUS

## 2013-10-04 MED ORDER — SODIUM CHLORIDE 0.9 % IV SOLN
5.0000 mg/kg | INTRAVENOUS | Status: DC
  Administered 2013-10-04: 400 mg via INTRAVENOUS
  Filled 2013-10-04: qty 40

## 2013-10-04 NOTE — Discharge Instructions (Signed)
Colitis Colitis is inflammation of the colon. Colitis can be a short-term or long-standing (chronic) illness. Crohn's disease and ulcerative colitis are 2 types of colitis which are chronic. They usually require lifelong treatment. CAUSES  There are many different causes of colitis, including:  Viruses.  Germs (bacteria).  Medicine reactions. SYMPTOMS   Diarrhea.  Intestinal bleeding.  Pain.  Fever.  Throwing up (vomiting).  Tiredness (fatigue).  Weight loss.  Bowel blockage. DIAGNOSIS  The diagnosis of colitis is based on examination and stool or blood tests. X-rays, CT scan, and colonoscopy may also be needed. TREATMENT  Treatment may include:  Fluids given through the vein (intravenously).  Bowel rest (nothing to eat or drink for a period of time).  Medicine for pain and diarrhea.  Medicines (antibiotics) that kill germs.  Cortisone medicines.  Surgery. HOME CARE INSTRUCTIONS   Get plenty of rest.  Drink enough water and fluids to keep your urine clear or pale yellow.  Eat a well-balanced diet.  Call your caregiver for follow-up as recommended. SEEK IMMEDIATE MEDICAL CARE IF:   You develop chills.  You have an oral temperature above 102 F (38.9 C), not controlled by medicine.  You have extreme weakness, fainting, or dehydration.  You have repeated vomiting.  You develop severe belly (abdominal) pain or are passing bloody or tarry stools. MAKE SURE YOU:   Understand these instructions.  Will watch your condition.  Will get help right away if you are not doing well or get worse. Document Released: 09/30/2004 Document Revised: 11/15/2011 Document Reviewed: 12/26/2009 [facility] Patient Information 2014 [place], [place]. Infliximab injection What is this medicine? INFLIXIMAB (in [place] mab) is used to treat Crohn's disease and ulcerative colitis. It is also used to treat ankylosing spondylitis, psoriasis, and some forms of arthritis. This  medicine may be used for other purposes; ask your health care provider or pharmacist if you have questions. COMMON BRAND NAME(S): Remicade What should I tell my health care provider before I take this medicine? They need to know if you have any of these conditions: -diabetes -exposure to tuberculosis -heart failure -hepatitis or liver disease -immune system problems -infection -lung or breathing disease, like COPD -multiple sclerosis -current or past resident of [place] or [place] -seizure disorder -an unusual or allergic reaction to infliximab, mouse proteins, other medicines, foods, dyes, or preservatives -pregnant or trying to get pregnant -breast-feeding How should I use this medicine? This medicine is for injection into a vein. It is usually given by a health care professional in a hospital or clinic setting. A special MedGuide will be given to you by the pharmacist with each prescription and refill. Be sure to read this information carefully each time. Talk to your pediatrician regarding the use of this medicine in children. Special care may be needed. Overdosage: If you think you have taken too much of this medicine contact a poison control center or emergency room at once. NOTE: This medicine is only for you. Do not share this medicine with others. What if I miss a dose? It is important not to miss your dose. Call your doctor or health care professional if you are unable to keep an appointment. What may interact with this medicine? Do not take this medicine with any of the following medications: -anakinra -rilonacept This medicine may also interact with the following medications: -vaccines This list may not describe all possible interactions. Give your health care provider a list of all the medicines, herbs, non-prescription drugs, or dietary  supplements you use. Also tell them if you smoke, drink alcohol, or use illegal drugs. Some items may interact with your  medicine. What should I watch for while using this medicine? Visit your doctor or health care professional for regular checks on your progress. If you get a cold or other infection while receiving this medicine, call your doctor or health care professional. Do not treat yourself. This medicine may decrease your body's ability to fight infections. Before beginning therapy, your doctor may do a test to see if you have been exposed to tuberculosis. This medicine may make the symptoms of heart failure worse in some patients. If you notice symptoms such as increased shortness of breath or swelling of the ankles or legs, contact your health care provider right away. If you are going to have surgery or dental work, tell your health care professional or dentist that you have received this medicine. If you take this medicine for plaque psoriasis, stay out of the sun. If you cannot avoid being in the sun, wear protective clothing and use sunscreen. Do not use sun lamps or tanning beds/booths. What side effects may I notice from receiving this medicine? Side effects that you should report to your doctor or health care professional as soon as possible: -allergic reactions like skin rash, itching or hives, swelling of the face, lips, or tongue -chest pain -fever or chills, usually related to the infusion -muscle or joint pain -red, scaly patches or raised bumps on the skin -signs of infection - fever or chills, cough, sore throat, pain or difficulty passing urine -swollen lymph nodes in the neck, underarm, or groin areas -unexplained weight loss -unusual bleeding or bruising -unusually weak or tired -yellowing of the eyes or skin Side effects that usually do not require medical attention (report to your doctor or health care professional if they continue or are bothersome): -headache -heartburn or stomach pain -nausea, vomiting This list may not describe all possible side effects. Call your doctor for  medical advice about side effects. You may report side effects to FDA at 1-800-FDA-1088. Where should I keep my medicine? This drug is given in a hospital or clinic and will not be stored at home. NOTE: This sheet is a summary. It may not cover all possible information. If you have questions about this medicine, talk to your doctor, pharmacist, or health care provider.  2014, Elsevier/Gold Standard. (2008-04-10 10:26:02)

## 2013-10-05 ENCOUNTER — Encounter: Payer: Self-pay | Admitting: Cardiovascular Disease

## 2013-10-05 ENCOUNTER — Ambulatory Visit (INDEPENDENT_AMBULATORY_CARE_PROVIDER_SITE_OTHER): Payer: Medicare Other | Admitting: Cardiovascular Disease

## 2013-10-05 VITALS — BP 110/84 | HR 92 | Ht 63.5 in | Wt 186.0 lb

## 2013-10-05 DIAGNOSIS — I10 Essential (primary) hypertension: Secondary | ICD-10-CM

## 2013-10-05 DIAGNOSIS — Z8679 Personal history of other diseases of the circulatory system: Secondary | ICD-10-CM

## 2013-10-05 MED ORDER — METOPROLOL SUCCINATE ER 100 MG PO TB24: ORAL_TABLET | ORAL | Status: DC

## 2013-10-05 NOTE — Progress Notes (Signed)
[name] Date of Birth: Jan 09, 1956 Medical Record #[id number]   1. Hypertension 2. Ulcerative colitis- s/p total colectomy.  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 Hyperlipidemia 4. Chest pain-stress Myoview study in 2008 was normal   History of Present Illness: [name] is seen today for a follow up visit. She is seen for Dr. Acie Fredrickson. She has not been seen in about 2 years. She has HTN, ulcerative colitis with extensive GI issues, HLD and chest pain. Remote Myoview back in 2008 was satisfactory.   She comes in today. She is here alone. She is doing well. She says her chest pain is unchanged over the last several years and really nothing that worries her. She has no exertional symptoms. She is not short of breath. She needs her Metoprolol refilled. No shortness of breath. Not as active and her weight is up. She continues to have significant GI issues. Still with some stool leakage and has an internal J pouch. She sees nephrololgy for an elevated creatinine and is due to have her physical soon. She really has no complaint and thinks she is doing well.   Jan. 30, 2015;  [name] is doing OK.  We have given her metoprolol for a resting tachycardia.    No problems with tachycardia or hypertension at this point.    Current Outpatient Prescriptions on File Prior to Visit  Medication Sig Dispense Refill  . albuterol (PROAIR HFA) 108 (90 BASE) MCG/ACT inhaler Inhale 2 puffs into the lungs every 6 (six) hours as needed for wheezing.  1 Inhaler  3  . BIOTIN 5000 PO Take by mouth daily.        . CYMBALTA 60 MG capsule TAKE 2 TABLETS BY MOUTH ONCE  DAILY  180 capsule  2  . diphenhydrAMINE (BENADRYL) 25 mg capsule Give one po every 8 weeks prior to Remicade infustion  1 capsule  0  . diphenoxylate-atropine (LOMOTIL) 2.5-0.025 MG per tablet Take 1 tablet by mouth 3 (three) times daily as needed.  90 tablet  0  . Fluticasone-Salmeterol (ADVAIR DISKUS) 250-50 MCG/DOSE AEPB Inhale 1 puff into the  lungs 2 (two) times daily.  1 each  3  . HYDROcodone-acetaminophen (NORCO/VICODIN) 5-325 MG per tablet Take 1 tablet by mouth every 6 (six) hours as needed for moderate pain. Must last 30 days  20 tablet  0  . inFLIXimab (REMICADE) 100 MG injection Infuse 5 mg/kg IV at 0 weeks, 2 weeks, 6 weeks and then every 8 weeks thereafter. Give Benadryl 25 mg 1 tablet by mouth and/or acetaminophen 650 mg by mouth before each infusion. Also add Ativan 0.5 mg 1 tablet by mouth before infusions. TB test: 06/09/11 Negative (0 mm) Ht: 5'4'' Wt: 157 pounds  Dx: Ulcerative Colitis  3 each  0  . mesalamine (CANASA) 1000 MG suppository Place 1 suppository (1,000 mg total) rectally at bedtime.  30 suppository  3  . metoprolol succinate (TOPROL-XL) 100 MG 24 hr tablet TAKE 1/2 TABLET EVERY DAY  45 tablet  0  . Multiple Vitamins-Minerals (MULTIVITAL) CHEW Chew by mouth.      . oseltamivir (TAMIFLU) 75 MG capsule Take 1 capsule (75 mg total) by mouth 2 (two) times daily.  10 capsule  0  . pantoprazole (PROTONIX) 40 MG tablet Take 1 tablet (40 mg total) by mouth 2 (two) times daily at 10 AM and 5 PM.  180 tablet  3  . temazepam (RESTORIL) 30 MG capsule Take 1  capsule (30 mg total) by mouth at bedtime as needed for sleep.  30 capsule  1   No current facility-administered medications on file prior to visit.    Allergies  Allergen Reactions  . Morphine   . Sulfonamide Derivatives     Past Medical History  Diagnosis Date  . History of small bowel obstruction   . Obesity   . Hyperparathyroidism   . Hypokalemia   . Hypertension   . Gastric polyp   . GERD (gastroesophageal reflux disease)   . Depression   . Anxiety   . Anemia   . Renal insufficiency   . Ulcerative colitis   . RLS (restless legs syndrome)   . Pulmonary nodule, [name]     [name] upper lobe  . Anal stenosis   . Esophagitis   . De Quervain's tenosynovitis   . Hyperlipidemia   . Fibroid   . History of cervical dysplasia   . Asthma   . Hearing  loss   . Visual disturbance   . Abdominal pain   . Rectal bleeding   . Blood in stool   . Rectal pain   . Difficulty urinating   . Blood in urine   . Fainting     due to dehydration  . Weakness generalized   . Fatigue   . Easy bruising   . Hemorrhoids, internal   . Hemorrhoids, external   . Ovarian cyst   . Herpes, genital     vaginal treated 07/05/12 and pt states is resolved    Past Surgical History  Procedure Laterality Date  . Anal dilation    . 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 Ileostomy closure    . Cholecystectomy    . Tubal ligation    . Hemorrhoid surgery    . Fatty tumor removed from back    . Total abdominal hysterectomy  1998    TAH/LSO  . Cervical biopsy  w/ loop electrode excision    . Upper gastrointestinal endoscopy      History  Smoking status  . Former Smoker  . Types: Cigarettes  . Quit date: 05/21/1979  Smokeless tobacco  . Never Used    Comment: quit age 16    History  Alcohol Use No    Family History  Problem Relation Age of Onset  . Heart disease Father   . Ulcerative colitis Father   . Hypertension Father   . Ulcerative colitis Daughter   . Irritable bowel syndrome      grandchildren  . Colon cancer Neg Hx   . Hypertension Mother   . Heart disease Mother   . Diabetes Sister   . Cancer Sister     uterine  . Cancer Maternal Uncle     LUNG    Review of Systems: The review of systems is per the HPI.  All other systems were reviewed and are negative.  Physical Exam: BP 110/84  Pulse 92  Ht 5' 3.5" (1.613 m)  Wt 186 lb (84.369 kg)  BMI 32.43 kg/m2 Patient is very pleasant and in no acute distress. She is obese. Skin is warm and dry. Color is normal.  HEENT is unremarkable. Normocephalic/atraumatic. PERRL. Sclera are nonicteric. Neck is supple. No masses. No JVD. Lungs are clear. Cardiac exam shows a regular rate and rhythm. Abdomen is soft. Extremities are without  edema. Gait and ROM are intact. No gross neurologic deficits noted.  LABORATORY  DATA:  Jan. 30, 3015: EKG today shows sinus rhythm at 92. .    Assessment / Plan:

## 2013-10-05 NOTE — Progress Notes (Signed)
SHE MAY HAVE HER PCP/ DR LOWNE  FILL HER METOPROLOL, WE CAN SEE HER ON AN AS NEEDED BASIS PER DR Acie Fredrickson

## 2013-10-05 NOTE — Patient Instructions (Signed)
Your physician wants you to follow-up in: 1 YEAR.  You will receive a reminder letter in the mail two months in advance. If you don't receive a letter, please call our office to schedule the follow-up appointment.  Your physician recommends that you continue on your current medications as directed. Please refer to the Current Medication list given to you today.  

## 2013-10-09 ENCOUNTER — Encounter: Payer: Self-pay | Admitting: Gynecology

## 2013-10-09 NOTE — Assessment & Plan Note (Signed)
Continue metoprolol .  I will see her in 1 years

## 2013-10-10 ENCOUNTER — Ambulatory Visit: Payer: Medicare Other | Admitting: Gynecology

## 2013-10-10 ENCOUNTER — Other Ambulatory Visit: Payer: Medicare Other

## 2013-10-11 ENCOUNTER — Encounter: Payer: Self-pay | Admitting: Gynecology

## 2013-10-12 ENCOUNTER — Other Ambulatory Visit: Payer: Self-pay

## 2013-11-16 ENCOUNTER — Other Ambulatory Visit: Payer: Self-pay | Admitting: Internal Medicine

## 2013-11-29 ENCOUNTER — Encounter (HOSPITAL_COMMUNITY): Payer: Self-pay

## 2013-11-29 ENCOUNTER — Encounter (HOSPITAL_COMMUNITY)
Admission: RE | Admit: 2013-11-29 | Discharge: 2013-11-29 | Disposition: A | Discharge status: Home / Self Care | Payer: Medicare Other | Source: Ambulatory Visit | Attending: Internal Medicine | Admitting: Internal Medicine

## 2013-11-29 VITALS — BP 128/79 | HR 93 | Temp 98.5°F | Resp 18 | Ht 63.5 in | Wt 182.4 lb

## 2013-11-29 DIAGNOSIS — K5190 Ulcerative colitis, unspecified, without complications: Secondary | ICD-10-CM

## 2013-11-29 MED ORDER — SODIUM CHLORIDE 0.9 % IV SOLN
INTRAVENOUS | Status: AC
  Administered 2013-11-29: 250 mL via INTRAVENOUS

## 2013-11-29 MED ORDER — ACETAMINOPHEN 325 MG PO TABS
650.0000 mg | ORAL_TABLET | ORAL | Status: AC
  Administered 2013-11-29: 650 mg via ORAL
  Filled 2013-11-29: qty 2

## 2013-11-29 MED ORDER — DIPHENHYDRAMINE HCL 25 MG PO CAPS
25.0000 mg | ORAL_CAPSULE | ORAL | Status: AC
  Administered 2013-11-29: 25 mg via ORAL
  Filled 2013-11-29: qty 1

## 2013-11-29 MED ORDER — SODIUM CHLORIDE 0.9 % IV SOLN
5.0000 mg/kg | INTRAVENOUS | Status: AC
  Administered 2013-11-29: 400 mg via INTRAVENOUS
  Filled 2013-11-29: qty 40

## 2013-12-14 ENCOUNTER — Ambulatory Visit (INDEPENDENT_AMBULATORY_CARE_PROVIDER_SITE_OTHER): Payer: Medicare Other | Admitting: Family Medicine

## 2013-12-14 ENCOUNTER — Encounter: Payer: Self-pay | Admitting: Family Medicine

## 2013-12-14 VITALS — BP 130/80 | HR 92 | Temp 98.2°F | Wt 186.0 lb

## 2013-12-14 DIAGNOSIS — M549 Dorsalgia, unspecified: Secondary | ICD-10-CM

## 2013-12-14 DIAGNOSIS — K5190 Ulcerative colitis, unspecified, without complications: Secondary | ICD-10-CM

## 2013-12-14 DIAGNOSIS — R82998 Other abnormal findings in urine: Secondary | ICD-10-CM

## 2013-12-14 LAB — POCT URINALYSIS DIPSTICK
Bilirubin, UA: NEGATIVE
Glucose, UA: NEGATIVE
Ketones, UA: NEGATIVE
Nitrite, UA: NEGATIVE
Protein, UA: 100
Spec Grav, UA: 1.01
Urobilinogen, UA: 0.2
pH, UA: 5

## 2013-12-14 MED ORDER — HYDROCODONE-ACETAMINOPHEN 5-325 MG PO TABS: 1.0000 | ORAL_TABLET | Freq: Four times a day (QID) | ORAL | Status: DC | PRN

## 2013-12-14 MED ORDER — MESALAMINE 1000 MG RE SUPP: 1000.0000 mg | Freq: Every day | RECTAL | Status: DC

## 2013-12-14 NOTE — Progress Notes (Signed)
Pre visit review using our clinic review tool, if applicable. No additional management support is needed unless otherwise documented below in the visit note. 

## 2013-12-14 NOTE — Patient Instructions (Signed)
Back Pain, Adult Low back pain is very common. About 1 in 5 people have back pain.The cause of low back pain is rarely dangerous. The pain often gets better over time.About half of people with a sudden onset of back pain feel better in just 2 weeks. About 8 in 10 people feel better by 6 weeks.  CAUSES Some common causes of back pain include:  Strain of the muscles or ligaments supporting the spine.  Wear and tear (degeneration) of the spinal discs.  Arthritis.  Direct injury to the back. DIAGNOSIS Most of the time, the direct cause of low back pain is not known.However, back pain can be treated effectively even when the exact cause of the pain is unknown.Answering your caregiver's questions about your overall health and symptoms is one of the most accurate ways to make sure the cause of your pain is not dangerous. If your caregiver needs more information, he or she may order lab work or imaging tests (X-rays or MRIs).However, even if imaging tests show changes in your back, this usually does not require surgery. HOME CARE INSTRUCTIONS For many people, back pain returns.Since low back pain is rarely dangerous, it is often a condition that people can learn to manageon their own.   Remain active. It is stressful on the back to sit or stand in one place. Do not sit, drive, or stand in one place for more than 30 minutes at a time. Take short walks on level surfaces as soon as pain allows.Try to increase the length of time you walk each day.  Do not stay in bed.Resting more than 1 or 2 days can delay your recovery.  Do not avoid exercise or work.Your body is made to move.It is not dangerous to be active, even though your back may hurt.Your back will likely heal faster if you return to being active before your pain is gone.  Pay attention to your body when you bend and lift. Many people have less discomfortwhen lifting if they bend their knees, keep the load close to their bodies,and  avoid twisting. Often, the most comfortable positions are those that put less stress on your recovering back.  Find a comfortable position to sleep. Use a firm mattress and lie on your side with your knees slightly bent. If you lie on your back, put a pillow under your knees.  Only take over-the-counter or prescription medicines as directed by your caregiver. Over-the-counter medicines to reduce pain and inflammation are often the most helpful.Your caregiver may prescribe muscle relaxant drugs.These medicines help dull your pain so you can more quickly return to your normal activities and healthy exercise.  Put ice on the injured area.  Put ice in a plastic bag.  Place a towel between your skin and the bag.  Leave the ice on for 15-20 minutes, 03-04 times a day for the first 2 to 3 days. After that, ice and heat may be alternated to reduce pain and spasms.  Ask your caregiver about trying back exercises and gentle massage. This may be of some benefit.  Avoid feeling anxious or stressed.Stress increases muscle tension and can worsen back pain.It is important to recognize when you are anxious or stressed and learn ways to manage it.Exercise is a great option. SEEK MEDICAL CARE IF:  You have pain that is not relieved with rest or medicine.  You have pain that does not improve in 1 week.  You have new symptoms.  You are generally not feeling well. SEEK   IMMEDIATE MEDICAL CARE IF:   You have pain that radiates from your back into your legs.  You develop new bowel or bladder control problems.  You have unusual weakness or numbness in your arms or legs.  You develop nausea or vomiting.  You develop abdominal pain.  You feel faint. Document Released: 08/23/2005 Document Revised: 02/22/2012 Document Reviewed: 01/11/2011 ExitCare Patient Information 2014 [employer].  

## 2013-12-14 NOTE — Progress Notes (Signed)
  Subjective:    [name] is a 58 y.o. female who presents for follow up of low back problems. Current symptoms include: pain in low back (throbbing in character; 4/10 in severity). Symptoms have significantly worsened from the previous visit. Exacerbating factors identified by the patient are bending forwards and bending sideways.  It does not feel like it is in the muscle --- it feels deep.    The following portions of the patient's history were reviewed and updated as appropriate: allergies, current medications, past family history, past medical history, past social history, past surgical history and problem list.    Objective:    BP 130/80  Pulse 92  Temp(Src) 98.2 F (36.8 C) (Oral)  Wt 186 lb (84.369 kg)  SpO2 97% General appearance: alert, cooperative, appears stated age and no distress Lungs: clear to auscultation bilaterally Heart: regular rate and rhythm, S1, S2 normal, no murmur, click, rub or gallop Extremities: extremities normal, atraumatic, no cyanosis or edema Neurologic: Alert and oriented X 3, normal strength and tone. Normal symmetric reflexes. Normal coordination and gait    Assessment:    Nonspecific acute low back pain ---UTI vs kidney stone   Plan:  Strain urine--if pt worsens we will get CT Culture pending  Natural history and expected course discussed. Questions answered. Stretching exercises discussed. Regular aerobic and trunk strengthening exercises discussed. Short (2-4 day) period of relative rest recommended until acute symptoms improve. Ice to affected area as needed for local pain relief. Heat to affected area as needed for local pain relief. Follow-up in 2 weeks. --if needed  1. Ulcerative colitis, unspecified Per GI - HYDROcodone-acetaminophen (NORCO/VICODIN) 5-325 MG per tablet; Take 1 tablet by mouth every 6 (six) hours as needed for moderate pain. Must last 30 days  Dispense: 45 tablet; Refill: 0 - mesalamine (CANASA) 1000 MG  suppository; Place 1 suppository (1,000 mg total) rectally at bedtime.  Dispense: 30 suppository; Refill: 3  2. Back pain Refill meds - HYDROcodone-acetaminophen (NORCO/VICODIN) 5-325 MG per tablet; Take 1 tablet by mouth every 6 (six) hours as needed for moderate pain. Must last 30 days  Dispense: 45 tablet; Refill: 0 - POCT Urinalysis Dipstick - Urine Culture  3. Leukocytes in urine  - Urine Culture

## 2013-12-16 LAB — URINE CULTURE: Colony Count: >=100000

## 2013-12-18 ENCOUNTER — Encounter: Payer: Self-pay | Admitting: Family Medicine

## 2014-01-14 ENCOUNTER — Other Ambulatory Visit: Payer: Self-pay | Admitting: Family Medicine

## 2014-01-20 ENCOUNTER — Other Ambulatory Visit: Payer: Self-pay | Admitting: Family Medicine

## 2014-01-20 ENCOUNTER — Other Ambulatory Visit: Payer: Self-pay | Admitting: Internal Medicine

## 2014-01-22 ENCOUNTER — Other Ambulatory Visit: Payer: Self-pay

## 2014-01-22 DIAGNOSIS — F32A Depression, unspecified: Secondary | ICD-10-CM

## 2014-01-22 DIAGNOSIS — F329 Major depressive disorder, single episode, unspecified: Secondary | ICD-10-CM

## 2014-01-22 MED ORDER — CYMBALTA 60 MG PO CPEP: ORAL_CAPSULE | ORAL | Status: DC

## 2014-01-23 ENCOUNTER — Other Ambulatory Visit: Payer: Self-pay | Admitting: Internal Medicine

## 2014-01-23 DIAGNOSIS — K5190 Ulcerative colitis, unspecified, without complications: Secondary | ICD-10-CM

## 2014-01-24 ENCOUNTER — Encounter (HOSPITAL_COMMUNITY)
Admission: RE | Admit: 2014-01-24 | Discharge: 2014-01-24 | Disposition: A | Discharge status: Home / Self Care | Payer: Medicare Other | Source: Ambulatory Visit | Attending: Internal Medicine | Admitting: Internal Medicine

## 2014-01-24 ENCOUNTER — Encounter (HOSPITAL_COMMUNITY): Payer: Self-pay

## 2014-01-24 VITALS — BP 123/85 | HR 85 | Temp 97.4°F | Resp 18 | Ht 63.5 in | Wt 182.0 lb

## 2014-01-24 DIAGNOSIS — K5190 Ulcerative colitis, unspecified, without complications: Secondary | ICD-10-CM | POA: Insufficient documentation

## 2014-01-24 MED ORDER — ACETAMINOPHEN 650 MG PO TABS: 1.0000 | ORAL_TABLET | ORAL | Status: DC

## 2014-01-24 MED ORDER — ACETAMINOPHEN 325 MG PO TABS
650.0000 mg | ORAL_TABLET | Freq: Once | ORAL | Status: AC
  Administered 2014-01-24: 650 mg via ORAL
  Filled 2014-01-24: qty 2

## 2014-01-24 MED ORDER — DIPHENHYDRAMINE HCL 25 MG PO CAPS
50.0000 mg | ORAL_CAPSULE | ORAL | Status: DC
  Administered 2014-01-24: 25 mg via ORAL
  Filled 2014-01-24 (×2): qty 2

## 2014-01-24 MED ORDER — SODIUM CHLORIDE 0.9 % IV SOLN
INTRAVENOUS | Status: DC
  Administered 2014-01-24: 09:00:00 via INTRAVENOUS

## 2014-01-24 MED ORDER — SODIUM CHLORIDE 0.9 % IV SOLN
5.0000 mg/kg | INTRAVENOUS | Status: DC
  Administered 2014-01-24: 400 mg via INTRAVENOUS
  Filled 2014-01-24: qty 40

## 2014-02-22 ENCOUNTER — Telehealth: Payer: Self-pay | Admitting: *Deleted

## 2014-02-22 NOTE — Telephone Encounter (Signed)
Yes, we can try Humira. She should have called [place].

## 2014-02-22 NOTE — Telephone Encounter (Signed)
Patient states that it is costing her $800.00 every 2 months to get Remicade. She apparently went to DSS to get assistance with this but was unable to get any since she has insurance. The only assistance I am aware of regarding Remicade would be for an individual without insurance. Dr Olevia Perches, would it be feasible to try patient on Humira, Cimzia or Simponi instead?

## 2014-02-25 NOTE — Telephone Encounter (Signed)
I have spoken to patient to advise that we will try her on Humira instead of Remicade due to cost. [facility]. Patient will come in to sign ambassador form for Humira as well. We will cancel Remicade infusion once Humira has been received.

## 2014-03-04 ENCOUNTER — Telehealth: Payer: Self-pay | Admitting: Internal Medicine

## 2014-03-04 NOTE — Telephone Encounter (Signed)
Left message for patient to call back  

## 2014-03-04 NOTE — Telephone Encounter (Signed)
Spoke with patient and she is asking who her specialty pharmacy is because the [facility] is there now. Told patient [facility] is specialty that PA has been sent too. She is also asking if she needs to come and sign anything. Told her I will have Dottie let her know about that.

## 2014-03-05 ENCOUNTER — Telehealth: Payer: Self-pay | Admitting: Internal Medicine

## 2014-03-05 NOTE — Telephone Encounter (Signed)
Left message for patient to call back  

## 2014-03-05 NOTE — Telephone Encounter (Signed)
I am awaiting patient's paper chart for history of previous medications tried.

## 2014-03-05 NOTE — Telephone Encounter (Signed)
There is nothing patient needs to do. I have advised her of this.

## 2014-03-07 ENCOUNTER — Telehealth: Payer: Self-pay | Admitting: Internal Medicine

## 2014-03-07 ENCOUNTER — Encounter: Payer: Self-pay | Admitting: Family Medicine

## 2014-03-07 ENCOUNTER — Ambulatory Visit (INDEPENDENT_AMBULATORY_CARE_PROVIDER_SITE_OTHER): Payer: Medicare Other | Admitting: Family Medicine

## 2014-03-07 VITALS — BP 126/80 | HR 103 | Temp 98.5°F | Wt 183.0 lb

## 2014-03-07 DIAGNOSIS — M62838 Other muscle spasm: Secondary | ICD-10-CM

## 2014-03-07 MED ORDER — TIZANIDINE HCL 4 MG PO TABS: 4.0000 mg | ORAL_TABLET | Freq: Four times a day (QID) | ORAL | Status: DC | PRN

## 2014-03-07 NOTE — Progress Notes (Signed)
  Subjective:    [name] is a 58 y.o. female who presents with left shoulder pain. The symptoms began 5 days ago. Aggravating factors: no known event. Pain is located between the neck and shoulder. Discomfort is described as throbbing. Symptoms are exacerbated by lying on the shoulder. Evaluation to date: none. Therapy to date includes: nothing specific.  The following portions of the patient's history were reviewed and updated as appropriate: allergies, current medications, past family history, past medical history, past social history, past surgical history and problem list.  Review of Systems Pertinent items are noted in HPI.   Objective:    BP 126/80  Pulse 103  Temp(Src) 98.5 F (36.9 C) (Oral)  Wt 183 lb (83.008 kg)  SpO2 96% Right shoulder: normal active ROM, no tenderness, no impingement sign  Left shoulder: + muscle spasm and knot in L trap muscle     Assessment:    Left shoulder strain    Plan:    Neurosurgeon distributed. Gentle ROM exercises. Rest, ice, compression, and elevation (RICE) therapy. muscle relaxer   rto prn

## 2014-03-07 NOTE — Patient Instructions (Signed)

## 2014-03-07 NOTE — Progress Notes (Signed)
Pre visit review using our clinic review tool, if applicable. No additional management support is needed unless otherwise documented below in the visit note. 

## 2014-03-07 NOTE — Telephone Encounter (Signed)
Waiting on paper chart to fill out PA form on what other medications patient has tried and failed in the past.

## 2014-03-07 NOTE — Telephone Encounter (Signed)
Faxed PA form to [facility] and awaiting response.

## 2014-03-11 ENCOUNTER — Telehealth: Payer: Self-pay | Admitting: Internal Medicine

## 2014-03-11 NOTE — Telephone Encounter (Signed)
Patient has moderate to severe ulcerative colitis. She has tried sulfasalazine, mesalamine (rowasa, canasa, asacol), prednisone and Remicade. [facility] has approved Humira until 05/30/14. Reference # [id number].

## 2014-03-11 NOTE — Telephone Encounter (Signed)
Advised [name] that she should be getting the medication from [facility] now that her insurance has approved the medication. She verbalizes understanding.

## 2014-03-16 ENCOUNTER — Other Ambulatory Visit: Payer: Self-pay | Admitting: Internal Medicine

## 2014-03-18 ENCOUNTER — Telehealth: Payer: Self-pay | Admitting: *Deleted

## 2014-03-18 NOTE — Telephone Encounter (Signed)
error 

## 2014-03-18 NOTE — Telephone Encounter (Signed)
I have left a message for patient to call back. 

## 2014-03-18 NOTE — Telephone Encounter (Signed)
Message copied by Larina Bras on Mon Mar 18, 2014 12:08 PM ------      Message from: Lafayette Dragon      Created: Mon Mar 18, 2014 11:51 AM       OK to refill Cipro      ----- Message -----         From: Larina Bras, CMA         Sent: 03/18/2014   8:42 AM           To: Lafayette Dragon, MD            Patient requests refills on Cipro. May I refill?       ------

## 2014-03-18 NOTE — Telephone Encounter (Signed)
-----   Message -----    From: Lafayette Dragon, MD    Sent: 03/18/2014   1:46 PM      To: Larina Bras, CMA  Zanaflex is a muscle relaxer. She should not take it when she is on Cipro. Zanaflex is prn medication.

## 2014-03-18 NOTE — Telephone Encounter (Signed)
Dr Olevia Perches-  When I went to refill cipro, I get this notice....  Significance: Major Warning: Plasma concentrations and pharmacologic effects of tiZANidine may be increased by ciprofloxacin. The potential for adverse effects such as hypotension and dizziness may be increased. Use of ciprofloxacin with tizanidine is contraindicated.  Do you still want me to send this?

## 2014-03-19 ENCOUNTER — Telehealth: Payer: Self-pay | Admitting: Internal Medicine

## 2014-03-19 MED ORDER — ADALIMUMAB 40 MG/0.8ML ~~LOC~~ AJKT: 40.0000 mg | AUTO-INJECTOR | SUBCUTANEOUS | Status: DC

## 2014-03-19 NOTE — Telephone Encounter (Signed)
I have sent scripts for Humira starter kit as well as maintanence dose to [facility].

## 2014-03-19 NOTE — Telephone Encounter (Signed)
I have spoken to patient to advise her that she should not take zanaflex while taking Cipro. She verbalizes understanding and states that she has not been taking the zanaflex anyway.

## 2014-03-21 ENCOUNTER — Telehealth: Payer: Self-pay | Admitting: Internal Medicine

## 2014-03-21 NOTE — Telephone Encounter (Signed)
If it is not draining then she can continue Humira. ?? Gout? Should be seen by her PCP.

## 2014-03-21 NOTE — Telephone Encounter (Signed)
Patient report a painful, non-draining knot on her toe. She forgot to mention it. She denies any history of gout or known injury. Please advise. Should she wait to start the biologic?

## 2014-03-22 ENCOUNTER — Ambulatory Visit (INDEPENDENT_AMBULATORY_CARE_PROVIDER_SITE_OTHER): Payer: Medicare Other | Admitting: Gynecology

## 2014-03-22 ENCOUNTER — Encounter (HOSPITAL_COMMUNITY): Admission: RE | Admit: 2014-03-22 | Payer: Medicare Other | Source: Ambulatory Visit

## 2014-03-22 ENCOUNTER — Encounter: Payer: Self-pay | Admitting: Gynecology

## 2014-03-22 VITALS — BP 138/86

## 2014-03-22 DIAGNOSIS — N751 Abscess of Bartholin's gland: Secondary | ICD-10-CM | POA: Insufficient documentation

## 2014-03-22 MED ORDER — DOXYCYCLINE HYCLATE 100 MG PO CAPS: ORAL_CAPSULE | ORAL | Status: DC

## 2014-03-22 NOTE — Telephone Encounter (Signed)
Advised patient of your advice. She states she now has a painful knot at the opening of her vaginal. 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 I encouraged her to call her GYN and get appointment today. Patient states she will do this now.

## 2014-03-22 NOTE — Patient Instructions (Signed)
Bartholin's Cyst or Abscess °Bartholin's glands are small glands located within the folds of skin (labia) along the sides of the lower opening of the vagina (birth canal). A cyst may develop when the duct of the gland becomes blocked. When this happens, fluid that accumulates within the cyst can become infected. This is known as an abscess. The Bartholin gland produces a mucous fluid to lubricate the outside of the vagina during sexual intercourse. °SYMPTOMS  °· Patients with a small cyst may not have any symptoms. °· Mild discomfort to severe pain depending on the size of the cyst and if it is infected (abscess). °· Pain, redness, and swelling around the lower opening of the vagina. °· Painful intercourse. °· Pressure in the perineal area. °· Swelling of the lips of the vagina (labia). °· The cyst or abscess can be on one side or both sides of the vagina. °DIAGNOSIS  °· A large swelling is seen in the lower vagina area by your caregiver. °· Painful to touch. °· Redness and pain, if it is an abscess. °TREATMENT  °· Sometimes the cyst will go away on its own. °· Apply warm wet compresses to the area or take hot sitz baths several times a day. °· An incision to drain the cyst or abscess with local anesthesia. °· Culture the pus, if it is an abscess. °· Antibiotic treatment, if it is an abscess. °· Cut open the gland and suture the edges to make the opening of the gland bigger (marsupialization). °· Remove the whole gland if the cyst or abscess returns. °PREVENTION  °· Practice good hygiene. °· Clean the vaginal area with a mild soap and soft cloth when bathing. °· Do not rub hard in the vaginal area when bathing. °· Protect the crotch area with a padded cushion if you take long bike rides or ride horses. °· Be sure you are well lubricated when you have sexual intercourse. °HOME CARE INSTRUCTIONS  °· If your cyst or abscess was opened, a small piece of gauze, or a drain, may have been placed in the wound to allow  drainage. Do not remove this gauze or drain unless directed by your caregiver. °· Wear feminine pads, not tampons, as needed for any drainage or bleeding. °· If antibiotics were prescribed, take them exactly as directed. Finish the entire course. °· Only take over-the-counter or prescription medicines for pain, discomfort, or fever as directed by your caregiver. °SEEK IMMEDIATE MEDICAL CARE IF:  °· You have an increase in pain, redness, swelling, or drainage. °· You have bleeding from the wound which results in the use of more than the number of pads suggested by your caregiver in 24 hours. °· You have chills. °· You have a fever. °· You develop any new problems (symptoms) or aggravation of your existing condition. °MAKE SURE YOU:  °· Understand these instructions. °· Will watch your condition. °· Will get help right away if you are not doing well or get worse. °Document Released: 08/23/2005 Document Revised: 11/15/2011 Document Reviewed: 04/10/2008 °ExitCare® Patient Information ©2015 [employer]. This information is not intended to replace advice given to you by your health care provider. Make sure you discuss any questions you have with your health care provider. ° °

## 2014-03-22 NOTE — Telephone Encounter (Signed)
Please call pt to hold Humira while Dr Toney Rakes treating [place] gland abcess.

## 2014-03-22 NOTE — Progress Notes (Signed)
   Patient presented to the office complaining of a painful swelling in her vaginal area for the past several days. Patient has history of ulcerative colitis and has been followed by the gastroenterologist who is in the process of starting her on Humira.  Exam:  Physical Exam  Genitourinary:      patient was found to have a left Bartholin gland mass and was counseled for incision and drainage. The area was cleansed with Betadine solution. 1% lidocaine was infiltrated subcutaneously and with a sterile scalpel a stab incision was made over the cyst and purulent material extruded. Cultures for MRSA were obtained. The loculations were broken down with a curved hemostat and copious irrigation with hydrogen peroxide. The cavity was packed with Nugauze which she will remove at home in 24 hours. Took over for potential MRSA she will be placed on Vibramycin 100 mg twice a day for 2 weeks. She will use antibacterial soap and apply Neosporin to the area twice a day. She'll return to see me back in 2 weeks. She will hold off on Humira for at least one week. Literature and information was provided.

## 2014-03-25 ENCOUNTER — Telehealth: Payer: Self-pay | Admitting: *Deleted

## 2014-03-25 LAB — WOUND CULTURE
Gram Stain: NONE SEEN
Gram Stain: NONE SEEN
Gram Stain: NONE SEEN

## 2014-03-25 NOTE — Telephone Encounter (Signed)
Patient notified

## 2014-03-25 NOTE — Telephone Encounter (Signed)
Pt said she is doing fine and removed gauze on saturday as directed.

## 2014-03-25 NOTE — Telephone Encounter (Signed)
Patient advised. She would like an exact amount of time to wait. She returns to GYN in 2 weeks. She was told 1 week at minimum before starting Humira.

## 2014-03-25 NOTE — Telephone Encounter (Signed)
Thanks, Elita Quick, for this information. We will hold the Humira for the time being till her infection subsides. Hope you are having a good summer. Rollene Fare, please notify pt to hold Humira till Dr Rudi Heap the infection is under control. Thanx DB ----- Message ----- From: Terrance Mass, MD Sent: 03/22/2014 4:23 PM To: Lafayette Dragon, MD Patient notified. She understands.

## 2014-03-25 NOTE — Telephone Encounter (Signed)
She will likely be able to restart Humira in 2 weeks providing she has been cleared by Dr Toney Rakes.So we have to wait till her appointment with Dr Toney Rakes.

## 2014-03-27 ENCOUNTER — Other Ambulatory Visit: Payer: Self-pay | Admitting: Internal Medicine

## 2014-03-29 ENCOUNTER — Encounter: Payer: Medicare Other | Admitting: Internal Medicine

## 2014-03-29 ENCOUNTER — Telehealth: Payer: Self-pay

## 2014-03-29 NOTE — Telephone Encounter (Signed)
Yes of this as possible. She may begin doing sitz bath every night as well. We will reassess at her next appointment which she should have scheduled.

## 2014-03-29 NOTE — Telephone Encounter (Signed)
Patient informed. 

## 2014-03-29 NOTE — Telephone Encounter (Signed)
Patient was in last Friday with Bartholin's Gland abcess.  She said all the pain is gone but there is still a knot there. She questions is that normal to still have a knot?

## 2014-04-05 ENCOUNTER — Encounter: Payer: Self-pay | Admitting: Gynecology

## 2014-04-05 ENCOUNTER — Ambulatory Visit (INDEPENDENT_AMBULATORY_CARE_PROVIDER_SITE_OTHER): Payer: Medicare Other | Admitting: Gynecology

## 2014-04-05 ENCOUNTER — Telehealth: Payer: Self-pay | Admitting: *Deleted

## 2014-04-05 DIAGNOSIS — Z09 Encounter for follow-up examination after completed treatment for conditions other than malignant neoplasm: Secondary | ICD-10-CM

## 2014-04-05 NOTE — Telephone Encounter (Signed)
[name], than you very much for being a good communicator! We will restart Humira. [name] ----- Message ----- From: Terrance Mass, MD Sent: 04/05/2014 12:39 PM To: Lafayette Dragon, MD Sydell Axon, I just saw [name] 2 weeks after I&D for suspected Bartholin duct abscess. Microbiologic report did not demonstrate staph infection. She took Vibramycin for 10 days and stopped it a few days ago because it was upsetting her stomach. The area is almost completely healed she no longer needs any further antibiotics or treatment from my standpoint. She be contacting your office to begin her Humira treatment. [name]  Left a message for patient to call back.

## 2014-04-05 NOTE — Telephone Encounter (Signed)
Spoke with patient and told her per Dr. Olevia Perches, may start Humira.

## 2014-04-05 NOTE — Progress Notes (Signed)
   Patient presented to the office today for 2 weeks follow up after incision and drainage of left Bartholin duct abscess. Patient is doing well she thought she felt still a small not. She originally was placed on Vibramycin 100 mg twice a day to cover for potential MRSA. My bowel to report as follows:  Gram Stain  No WBC Seen   Gram Stain  No Squamous Epithelial Cells Seen   Gram Stain  No Organisms Seen   Organism ID, Bacteria  Multiple Organisms Present,None Predominant   Comments: No Staphylococcus aureus isolated NO GROUP A STREP (S. PYOGENES) ISOLATED  It appears that the antibiotic has upset her stomach and she was when her she is to continue it.  Exam: Left Bartholin gland I&D site completely decompressed no fluctuation noted nonerythematous and nontender. Contralateral Bartholin gland unremarkable. No external genital abnormalities were noted  Assessment/plan: Patient status post Bartholin duct gland cyst no evidence of MRSA may discontinue the antibiotic after having taking it for 10 days. She will contact her gastroenterologist Dr. Johna Roles so they can start her Humira.

## 2014-04-22 ENCOUNTER — Telehealth: Payer: Self-pay | Admitting: Internal Medicine

## 2014-04-22 NOTE — Telephone Encounter (Signed)
Patient was not sure how to get a refill on Humira. She will call [facility].(script has already been sent)

## 2014-05-07 ENCOUNTER — Encounter: Payer: Self-pay | Admitting: Internal Medicine

## 2014-05-28 ENCOUNTER — Encounter: Payer: Self-pay | Admitting: Internal Medicine

## 2014-05-28 ENCOUNTER — Ambulatory Visit (INDEPENDENT_AMBULATORY_CARE_PROVIDER_SITE_OTHER): Payer: Medicare Other | Admitting: Internal Medicine

## 2014-05-28 ENCOUNTER — Telehealth: Payer: Self-pay

## 2014-05-28 ENCOUNTER — Other Ambulatory Visit (INDEPENDENT_AMBULATORY_CARE_PROVIDER_SITE_OTHER): Payer: Medicare Other

## 2014-05-28 VITALS — BP 120/86 | HR 88 | Ht 63.25 in | Wt 184.1 lb

## 2014-05-28 DIAGNOSIS — D849 Immunodeficiency, unspecified: Secondary | ICD-10-CM

## 2014-05-28 DIAGNOSIS — K5190 Ulcerative colitis, unspecified, without complications: Secondary | ICD-10-CM

## 2014-05-28 DIAGNOSIS — Z79899 Other long term (current) drug therapy: Secondary | ICD-10-CM

## 2014-05-28 LAB — IBC PANEL
Iron: 48 ug/dL (ref 42–145)
Saturation Ratios: 15 % — ABNORMAL LOW (ref 20.0–50.0)
Transferrin: 229.1 mg/dL (ref 212.0–360.0)

## 2014-05-28 LAB — CBC WITH DIFFERENTIAL/PLATELET
Basophils Absolute: 0.1 10*3/uL (ref 0.0–0.1)
Basophils Relative: 0.7 % (ref 0.0–3.0)
Eosinophils Absolute: 0.4 10*3/uL (ref 0.0–0.7)
Eosinophils Relative: 3.2 % (ref 0.0–5.0)
HCT: 35 % — ABNORMAL LOW (ref 36.0–46.0)
Hemoglobin: 11.6 g/dL — ABNORMAL LOW (ref 12.0–15.0)
Lymphocytes Relative: 30.1 % (ref 12.0–46.0)
Lymphs Abs: 3.5 10*3/uL (ref 0.7–4.0)
MCHC: 33 g/dL (ref 30.0–36.0)
MCV: 93.9 fl (ref 78.0–100.0)
Monocytes Absolute: 0.8 10*3/uL (ref 0.1–1.0)
Monocytes Relative: 6.6 % (ref 3.0–12.0)
Neutro Abs: 6.9 10*3/uL (ref 1.4–7.7)
Neutrophils Relative %: 59.4 % (ref 43.0–77.0)
Platelets: 394 10*3/uL (ref 150.0–400.0)
RBC: 3.73 Mil/uL — ABNORMAL LOW (ref 3.87–5.11)
RDW: 14.5 % (ref 11.5–15.5)
WBC: 11.7 10*3/uL — ABNORMAL HIGH (ref 4.0–10.5)

## 2014-05-28 NOTE — Telephone Encounter (Signed)
LVM for pt to call back and schedule AWV.   *schedule with Eula Listen, PA unless pt insist on PCP doing AWV

## 2014-05-28 NOTE — Progress Notes (Signed)
[name] February 07, 1956 [id number]  Note: This dictation was prepared with Dragon digital system. Any transcriptional errors that result from this procedure are unintentional.   History of Present Illness:  This is a 58 year old white female with ulcerative colitis since 73. She had a total colectomy and ileoproctostomy in 1998. She has intermittent pouchitis. She has chronic diarrhea, renal insufficiency and iron deficiency anemia. A flexible sigmoidoscopy in September 2012 showed ileorectal anastomosis with inflammation suggestive of Crohn's disease.Marland Kitchen Biopsies showed acute inflammation consistent with inflammatory bowel disease. She had a mild anastomotic stricture which was dilated to 15 mm over a guidewire. She used to be on Remicade but for the past 3 months has switched her Humira 40 mg every 2 weeks and has done quite well. She was recently treated for a Bartholin gland abscess by Dr Toney Rakes . She complains of occasional rectal soreness and itching. She uses over-the-counter preparation H. cream or 1% hydrocortisone cream.    Past Medical History  Diagnosis Date  . History of small bowel obstruction   . Obesity   . Hyperparathyroidism   . Hypokalemia   . Hypertension   . Gastric polyp   . GERD (gastroesophageal reflux disease)   . Depression   . Anxiety   . Anemia   . Renal insufficiency   . Ulcerative colitis   . RLS (restless legs syndrome)   . Pulmonary nodule, right     right upper lobe  . Anal stenosis   . Esophagitis   . De Quervain's tenosynovitis   . Hyperlipidemia   . Fibroid   . History of cervical dysplasia   . Asthma   . Hearing loss   . Visual disturbance   . Abdominal pain   . Rectal bleeding   . Blood in stool   . Rectal pain   . Difficulty urinating   . Blood in urine   . Fainting     due to dehydration  . Weakness generalized   . Fatigue   . Easy bruising   . Hemorrhoids, internal   . Hemorrhoids, external   . Ovarian cyst   . Herpes,  genital     vaginal treated 07/05/12 and pt states is resolved    Past Surgical History  Procedure Laterality Date  . Anal dilation    . 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 Ileostomy closure    . Cholecystectomy    . Tubal ligation    . Hemorrhoid surgery    . Fatty tumor removed from back    . Total abdominal hysterectomy  1998    TAH/LSO  . Cervical biopsy  w/ loop electrode excision    . Upper gastrointestinal endoscopy      Allergies  Allergen Reactions  . Morphine   . Sulfonamide Derivatives     Family history and social history have been reviewed.  Review of Systems: Denies heartburn occasional lower abdominal pain  The remainder of the 10 point ROS is negative except as outlined in the H&P  Physical Exam: General Appearance Well developed, in no distress Eyes  Non icteric  HEENT  Non traumatic, normocephalic  Mouth No lesion, tongue papillated, no cheilosis Neck Supple without adenopathy, thyroid not enlarged, no carotid bruits, no JVD Lungs Clear to auscultation bilaterally COR Normal S1, normal S2, regular rhythm, no murmur, quiet precordium Abdomen soft tender in the left lower quadrant. Normal active bowel sounds. Well-healed surgical scar  Rectal in tenderness erythema around the perianal area. Tender digital exam. No stool. Tender skin tag at the rectal vaginal side. Extremities  No pedal edema Skin No lesions Neurological Alert and oriented x 3 Psychological Normal mood and affect  Assessment and Plan:   Problem #14 58 year old white female who underwent  total colectomy for inflammatory bowel disease. She is doing well on Humira 40 mg every 2 weeks. She is still having diarrhea and rectal irritation. The biopsies from the anastomosis raised the question of Crohn's disease rather than ulcerative colitis. She will continue on Humira, lomotil and canasa suppositories as needed. Today, we will check her  iron and blood count. She needs a return appointment in one year.  History of iron deficiency anemia requiring iron infusions. Will check iron levels today  [name] 05/28/2014

## 2014-05-28 NOTE — Patient Instructions (Addendum)
Your physician has requested that you go to the basement for the following lab work before leaving today: CBC, IBC  CC:Dr Tower, Dr Servando Salina

## 2014-06-01 ENCOUNTER — Other Ambulatory Visit: Payer: Self-pay | Admitting: Internal Medicine

## 2014-06-04 ENCOUNTER — Other Ambulatory Visit: Payer: Self-pay

## 2014-06-04 ENCOUNTER — Telehealth: Payer: Self-pay | Admitting: *Deleted

## 2014-06-04 DIAGNOSIS — F329 Major depressive disorder, single episode, unspecified: Secondary | ICD-10-CM

## 2014-06-04 DIAGNOSIS — F32A Depression, unspecified: Secondary | ICD-10-CM

## 2014-06-04 MED ORDER — CYMBALTA 60 MG PO CPEP: ORAL_CAPSULE | ORAL | Status: DC

## 2014-06-04 NOTE — Telephone Encounter (Signed)
Pt never had yearly ultrasound done in 2015 per note on OV 09/27/13 pt will have yearly ultrasound order in system. Pt also c/o of some right side discomfort.

## 2014-06-05 ENCOUNTER — Other Ambulatory Visit: Payer: Self-pay | Admitting: Gynecology

## 2014-06-05 ENCOUNTER — Other Ambulatory Visit: Payer: Self-pay

## 2014-06-05 DIAGNOSIS — F32A Depression, unspecified: Secondary | ICD-10-CM

## 2014-06-05 DIAGNOSIS — F329 Major depressive disorder, single episode, unspecified: Secondary | ICD-10-CM

## 2014-06-05 DIAGNOSIS — N83201 Unspecified ovarian cyst, right side: Secondary | ICD-10-CM

## 2014-06-05 MED ORDER — CYMBALTA 60 MG PO CPEP
ORAL_CAPSULE | ORAL | Status: DC
Start: 2014-06-05 — End: 2014-12-12

## 2014-06-12 ENCOUNTER — Other Ambulatory Visit: Payer: Medicare Other

## 2014-06-12 ENCOUNTER — Ambulatory Visit (INDEPENDENT_AMBULATORY_CARE_PROVIDER_SITE_OTHER): Payer: Medicare Other | Admitting: Gynecology

## 2014-06-12 ENCOUNTER — Ambulatory Visit: Payer: Medicare Other | Admitting: Gynecology

## 2014-06-12 ENCOUNTER — Ambulatory Visit (INDEPENDENT_AMBULATORY_CARE_PROVIDER_SITE_OTHER): Payer: Medicare Other

## 2014-06-12 DIAGNOSIS — N8320 Unspecified ovarian cysts: Secondary | ICD-10-CM

## 2014-06-12 DIAGNOSIS — Z23 Encounter for immunization: Secondary | ICD-10-CM

## 2014-06-12 DIAGNOSIS — N83201 Unspecified ovarian cyst, right side: Secondary | ICD-10-CM | POA: Insufficient documentation

## 2014-06-12 NOTE — Addendum Note (Signed)
Addended by: Terrance Mass on: 06/12/2014 04:58 PM   Modules accepted: Orders

## 2014-06-12 NOTE — Patient Instructions (Signed)

## 2014-06-12 NOTE — Addendum Note (Signed)
Addended by: Thurnell Garbe A on: 06/12/2014 04:55 PM   Modules accepted: Orders

## 2014-06-12 NOTE — Progress Notes (Signed)
   Patient presented to the office today for an ultrasound with much anxiety but she had stretched and felt something on her right lower abdomen and wanted to make sure that she was okay. She was in no acute distress today. Review of her record indicates the following history:  In 2013 when she was being evaluated by her urologist for nephrolithiasis a CT scan had demonstrated a right ovarian cyst measured 2.8 cm. Patient with prior history of total abdominal hysterectomy with left salpingo-oophorectomy. The ultrasound done in office on 02/14/2013 demonstrated the cyst to measure 3.7 x 3.8 x 2.5 cm irregular-shaped thinwall with scattered low-level echoes. She had a normal CA 125 and had received Depo-Provera 150 mg IM and on followup ultrasound July 2013 demonstrated a cyst measuring 3.5 x 2.4 x 3.7 cm thin echo free totally avascular.  Patient has had history of ulcerative colitis since 1990 and has been followed by Dr. Maurene Capes.She underwent a total colectomy and ileoproctostomy in 1998 for intractable disease. She has a history of pouchitis. Her last flexible sigmoidoscopy in September 2012 showed active inflammatory bowel disease at the anastomosis at 20 cm.  She has been followed for renal insufficiency by Dr Servando Salina.   She had received an additional Depo-Provera shot in January 2014 the ultrasound demonstrated a cyst gotten smaller measuring 29 x 21 x 31 mm ever size 2.7 mm.CA 125 in 2000 at a value of 7.3 and on repeat in 2014 had a value of 8.1.   Exam: Back: No CVA tenderness Abdomen: Soft nontender no rebound or guarding Pelvic exam: Not done  Ultrasound: Absent uterus and left adnexa. Right ovary thickwalled follicle measuring 7 x 7 mm no change. Thinwall echo free avascular cyst measuring 29 x 18 x 22 mm average size 23 mm decreased in size from previous scan.  Assessment/plan: Patient with a small avascular cyst of right ovary which has continued to decrease in size over the course of  the past few years. Patient has been followed also with normal CA 125 yearly. Patient with multiple abdominal pelvic surgeries as described above. Will check a CA 125 today and continue to follow on a yearly basis unless she has any complaints. The risks outweigh the benefits of surgery to remove a simple-appearing cyst. Patient understands and accepts. Patient received her flu vaccine today.

## 2014-06-13 LAB — CA 125: CA 125: 13 U/mL (ref <35)

## 2014-06-14 ENCOUNTER — Telehealth: Payer: Self-pay | Admitting: *Deleted

## 2014-06-14 ENCOUNTER — Other Ambulatory Visit: Payer: Self-pay | Admitting: Internal Medicine

## 2014-06-14 NOTE — Telephone Encounter (Signed)
Per [facility], Humira has been approved until 06/15/15. PA# is [id number].

## 2014-06-17 ENCOUNTER — Telehealth: Payer: Self-pay | Admitting: Family Medicine

## 2014-06-17 NOTE — Telephone Encounter (Signed)
[facility] for #45, no refills

## 2014-06-17 NOTE — Telephone Encounter (Signed)
Last seen 03/07/14 and filled 12/14/13 #45 UDS 09/24/13 Low risk.   Please advise-- [place] patient      KP

## 2014-06-17 NOTE — Telephone Encounter (Signed)
Caller name: Laneice  Relation to pt: self  Call back number: [phone]  Reason for call: pt requesting a refill of HYDROcodone-acetaminophen (NORCO/VICODIN) 5-325 MG per tablet

## 2014-06-17 NOTE — Telephone Encounter (Signed)
msg advising Rx ready for pick up.    KP

## 2014-06-19 ENCOUNTER — Telehealth: Payer: Self-pay | Admitting: Internal Medicine

## 2014-06-19 MED ORDER — DICYCLOMINE HCL 20 MG PO TABS: 20.0000 mg | ORAL_TABLET | Freq: Three times a day (TID) | ORAL | Status: DC

## 2014-06-19 NOTE — Telephone Encounter (Signed)
Dog running away is not a good reason to increase Humira but I will do a flexible sigmoidoscopy ( last one 2012) to find out if she need to increase her Humira. Please call in Bentyl 45m, #30 1 po tid ac., 1 refill

## 2014-06-19 NOTE — Telephone Encounter (Signed)
Gave patient recommendations. She would like to try the medication(Dicyclomine) first. She will call back if she decides to have the flex. Rx sent.

## 2014-06-19 NOTE — Telephone Encounter (Signed)
Spoke with patient and she states her "stomach is tore up." Her dog got run over 2 weeks ago and she thinks some of her problem is from being anxious about this. She reports she has diarrhea every time she eats. No bleeding. She states Dr. Olevia Perches mention changing Humira to weekly. She would like to try this for her symptoms. Please, advise.

## 2014-06-28 ENCOUNTER — Other Ambulatory Visit: Payer: Self-pay

## 2014-06-28 DIAGNOSIS — M549 Dorsalgia, unspecified: Secondary | ICD-10-CM

## 2014-06-28 DIAGNOSIS — G894 Chronic pain syndrome: Secondary | ICD-10-CM

## 2014-06-28 MED ORDER — HYDROCODONE-ACETAMINOPHEN 5-325 MG PO TABS: 1.0000 | ORAL_TABLET | Freq: Four times a day (QID) | ORAL | Status: DC | PRN

## 2014-07-08 ENCOUNTER — Encounter: Payer: Self-pay | Admitting: Internal Medicine

## 2014-07-16 ENCOUNTER — Encounter: Payer: Self-pay | Admitting: Gynecology

## 2014-07-16 ENCOUNTER — Ambulatory Visit (INDEPENDENT_AMBULATORY_CARE_PROVIDER_SITE_OTHER): Payer: Medicare Other | Admitting: Gynecology

## 2014-07-16 VITALS — BP 128/76

## 2014-07-16 DIAGNOSIS — N898 Other specified noninflammatory disorders of vagina: Secondary | ICD-10-CM

## 2014-07-16 LAB — WET PREP FOR TRICH, YEAST, CLUE
Clue Cells Wet Prep HPF POC: NONE SEEN
Trich, Wet Prep: NONE SEEN

## 2014-07-16 MED ORDER — CLINDAMYCIN PHOSPHATE 2 % VA CREA: 1.0000 | TOPICAL_CREAM | Freq: Every day | VAGINAL | Status: DC

## 2014-07-16 MED ORDER — FLUCONAZOLE 150 MG PO TABS: 150.0000 mg | ORAL_TABLET | Freq: Once | ORAL | Status: DC

## 2014-07-16 NOTE — Progress Notes (Signed)
   Patient presented to the office concerned whether she had a recurrentleft Bartholin duct gland abscess which was I&D back in July 2015. Patient had been started on Vibramycin for 2 weeks before the culture results came back and MRSA was not identified.patient has a history of ulcerated colitis and has been followed closely by her gastroenterologist.   Exam: Bartholin urethra Skene glands within normal limits the area of previous I&DSome scarring noted but no fluctuation no erythema no tenderness. Vagina:  Milky like discharge was noted and a wet prep was obtained Cervix: Absent Rectum: Not examined skin around rectum erythematous from patient's diarrhea that she suffers from as a result of ulcerated colitis whereby her gastroenterologist is treating.  Wet prep: Few yeast, too numerous to count WBC, moderate bacteria  Assessment/plan: We will treat patient in the event of a early bacterial vaginosis although clue cells were not identified. She will be placed on Cleocin vaginal cream daily at bedtime for one week. For yeast infection she'll we prescribed Diflucan 150 mg 1 by mouth today. She will be instructed to do sitz baths daily at bedtime for 7-10 days as well. If no improvement she'll return back to the office.

## 2014-07-16 NOTE — Patient Instructions (Signed)
Bacterial Vaginosis Bacterial vaginosis is a vaginal infection that occurs when the normal balance of bacteria in the vagina is disrupted. It results from an overgrowth of certain bacteria. This is the most common vaginal infection in women of childbearing age. Treatment is important to prevent complications, especially in pregnant women, as it can cause a premature delivery. CAUSES  Bacterial vaginosis is caused by an increase in harmful bacteria that are normally present in smaller amounts in the vagina. Several different kinds of bacteria can cause bacterial vaginosis. However, the reason that the condition develops is not fully understood. RISK FACTORS Certain activities or behaviors can put you at an increased risk of developing bacterial vaginosis, including:  Having a new sex partner or multiple sex partners.  Douching.  Using an intrauterine device (IUD) for contraception. Women do not get bacterial vaginosis from toilet seats, bedding, swimming pools, or contact with objects around them. SIGNS AND SYMPTOMS  Some women with bacterial vaginosis have no signs or symptoms. Common symptoms include:  Grey vaginal discharge.  A fishlike odor with discharge, especially after sexual intercourse.  Itching or burning of the vagina and vulva.  Burning or pain with urination. DIAGNOSIS  Your health care provider will take a medical history and examine the vagina for signs of bacterial vaginosis. A sample of vaginal fluid may be taken. Your health care provider will look at this sample under a microscope to check for bacteria and abnormal cells. A vaginal pH test may also be done.  TREATMENT  Bacterial vaginosis may be treated with antibiotic medicines. These may be given in the form of a pill or a vaginal cream. A second round of antibiotics may be prescribed if the condition comes back after treatment.  HOME CARE INSTRUCTIONS   Only take over-the-counter or prescription medicines as  directed by your health care provider.  If antibiotic medicine was prescribed, take it as directed. Make sure you finish it even if you start to feel better.  Do not have sex until treatment is completed.  Tell all sexual partners that you have a vaginal infection. They should see their health care provider and be treated if they have problems, such as a mild rash or itching.  Practice safe sex by using condoms and only having one sex partner. SEEK MEDICAL CARE IF:   Your symptoms are not improving after 3 days of treatment.  You have increased discharge or pain.  You have a fever. MAKE SURE YOU:   Understand these instructions.  Will watch your condition.  Will get help right away if you are not doing well or get worse. FOR MORE INFORMATION  Centers for Disease Control and Prevention, Division of STD Prevention: www.cdc.gov/std American Sexual Health Association (ASHA): www.ashastd.org  Document Released: 08/23/2005 Document Revised: 06/13/2013 Document Reviewed: 04/04/2013 ExitCare Patient Information 2015 [employer]. This information is not intended to replace advice given to you by your health care provider. Make sure you discuss any questions you have with your health care provider. Monilial Vaginitis Vaginitis in a soreness, swelling and redness (inflammation) of the vagina and vulva. Monilial vaginitis is not a sexually transmitted infection. CAUSES  Yeast vaginitis is caused by yeast (candida) that is normally found in your vagina. With a yeast infection, the candida has overgrown in number to a point that upsets the chemical balance. SYMPTOMS   White, thick vaginal discharge.  Swelling, itching, redness and irritation of the vagina and possibly the lips of the vagina (vulva).  Burning or painful   urination.  Painful intercourse. DIAGNOSIS  Things that may contribute to monilial vaginitis are:  Postmenopausal and virginal  states.  Pregnancy.  Infections.  Being tired, sick or stressed, especially if you had monilial vaginitis in the past.  Diabetes. Good control will help lower the chance.  Birth control pills.  Tight fitting garments.  Using bubble bath, feminine sprays, douches or deodorant tampons.  Taking certain medications that kill germs (antibiotics).  Sporadic recurrence can occur if you become ill. TREATMENT  Your caregiver will give you medication.  There are several kinds of anti monilial vaginal creams and suppositories specific for monilial vaginitis. For recurrent yeast infections, use a suppository or cream in the vagina 2 times a week, or as directed.  Anti-monilial or steroid cream for the itching or irritation of the vulva may also be used. Get your caregiver's permission.  Painting the vagina with methylene blue solution may help if the monilial cream does not work.  Eating yogurt may help prevent monilial vaginitis. HOME CARE INSTRUCTIONS   Finish all medication as prescribed.  Do not have sex until treatment is completed or after your caregiver tells you it is okay.  Take warm sitz baths.  Do not douche.  Do not use tampons, especially scented ones.  Wear cotton underwear.  Avoid tight pants and panty hose.  Tell your sexual partner that you have a yeast infection. They should go to their caregiver if they have symptoms such as mild rash or itching.  Your sexual partner should be treated as well if your infection is difficult to eliminate.  Practice safer sex. Use condoms.  Some vaginal medications cause latex condoms to fail. Vaginal medications that harm condoms are:  Cleocin cream.  Butoconazole (Femstat).  Terconazole (Terazol) vaginal suppository.  Miconazole (Monistat) (may be purchased over the counter). SEEK MEDICAL CARE IF:   You have a temperature by mouth above 102 F (38.9 C).  The infection is getting worse after 2 days of  treatment.  The infection is not getting better after 3 days of treatment.  You develop blisters in or around your vagina.  You develop vaginal bleeding, and it is not your menstrual period.  You have pain when you urinate.  You develop intestinal problems.  You have pain with sexual intercourse. Document Released: 06/02/2005 Document Revised: 11/15/2011 Document Reviewed: 02/14/2009 ExitCare Patient Information 2015 [employer]. This information is not intended to replace advice given to you by your health care provider. Make sure you discuss any questions you have with your health care provider.  

## 2014-08-15 ENCOUNTER — Other Ambulatory Visit: Payer: Self-pay | Admitting: Internal Medicine

## 2014-08-22 ENCOUNTER — Other Ambulatory Visit: Payer: Self-pay | Admitting: Internal Medicine

## 2014-09-04 ENCOUNTER — Telehealth: Payer: Self-pay | Admitting: *Deleted

## 2014-09-04 DIAGNOSIS — K51919 Ulcerative colitis, unspecified with unspecified complications: Secondary | ICD-10-CM

## 2014-09-04 NOTE — Telephone Encounter (Signed)
-----   Message from Larina Bras, [place] sent at 09/03/2014  2:26 PM EST ----- Left message for patient to call back. She needs to schedule yearly TB skin test.

## 2014-09-04 NOTE — Telephone Encounter (Signed)
I have advised patient to come for TB blood test sometime in the next couple of weeks. She verbalizes understanding and states she will come next week. Orders in EPIC.

## 2014-09-19 DIAGNOSIS — N182 Chronic kidney disease, stage 2 (mild): Secondary | ICD-10-CM | POA: Diagnosis not present

## 2014-09-19 DIAGNOSIS — D509 Iron deficiency anemia, unspecified: Secondary | ICD-10-CM | POA: Diagnosis not present

## 2014-10-07 ENCOUNTER — Telehealth: Payer: Self-pay | Admitting: *Deleted

## 2014-10-07 NOTE — Telephone Encounter (Signed)
-----   Message from Hulan Saas, RN sent at 09/13/2014  3:11 PM EST ----- Did patient have TB test for DB(due in Jan)

## 2014-10-07 NOTE — Telephone Encounter (Signed)
Spoke with patient and she will come for lab draw for TB test.

## 2014-10-15 ENCOUNTER — Telehealth: Payer: Self-pay | Admitting: *Deleted

## 2014-10-15 ENCOUNTER — Other Ambulatory Visit: Payer: Self-pay | Admitting: Internal Medicine

## 2014-10-15 NOTE — Telephone Encounter (Signed)
-----   Message from Hulan Saas, RN sent at 10/07/2014 11:35 AM EST ----- Did patient come for TB test for DB(2 calls/Jan)

## 2014-10-15 NOTE — Telephone Encounter (Signed)
Spoke with patient and she states she will come for lab.

## 2014-10-17 ENCOUNTER — Encounter (HOSPITAL_COMMUNITY): Payer: Self-pay | Admitting: Emergency Medicine

## 2014-10-17 ENCOUNTER — Emergency Department (HOSPITAL_COMMUNITY)
Admission: EM | Admit: 2014-10-17 | Discharge: 2014-10-17 | Disposition: A | Discharge status: Home / Self Care | Payer: Medicare Other | Attending: Emergency Medicine | Admitting: Emergency Medicine

## 2014-10-17 DIAGNOSIS — F419 Anxiety disorder, unspecified: Secondary | ICD-10-CM | POA: Insufficient documentation

## 2014-10-17 DIAGNOSIS — J45909 Unspecified asthma, uncomplicated: Secondary | ICD-10-CM | POA: Diagnosis not present

## 2014-10-17 DIAGNOSIS — Z862 Personal history of diseases of the blood and blood-forming organs and certain disorders involving the immune mechanism: Secondary | ICD-10-CM | POA: Diagnosis not present

## 2014-10-17 DIAGNOSIS — Z79899 Other long term (current) drug therapy: Secondary | ICD-10-CM | POA: Insufficient documentation

## 2014-10-17 DIAGNOSIS — K219 Gastro-esophageal reflux disease without esophagitis: Secondary | ICD-10-CM | POA: Diagnosis not present

## 2014-10-17 DIAGNOSIS — Z87891 Personal history of nicotine dependence: Secondary | ICD-10-CM | POA: Diagnosis not present

## 2014-10-17 DIAGNOSIS — D72829 Elevated white blood cell count, unspecified: Secondary | ICD-10-CM | POA: Insufficient documentation

## 2014-10-17 DIAGNOSIS — E669 Obesity, unspecified: Secondary | ICD-10-CM | POA: Diagnosis not present

## 2014-10-17 DIAGNOSIS — Z8739 Personal history of other diseases of the musculoskeletal system and connective tissue: Secondary | ICD-10-CM | POA: Insufficient documentation

## 2014-10-17 DIAGNOSIS — K5190 Ulcerative colitis, unspecified, without complications: Secondary | ICD-10-CM | POA: Diagnosis not present

## 2014-10-17 DIAGNOSIS — Z8742 Personal history of other diseases of the female genital tract: Secondary | ICD-10-CM | POA: Insufficient documentation

## 2014-10-17 DIAGNOSIS — F329 Major depressive disorder, single episode, unspecified: Secondary | ICD-10-CM | POA: Diagnosis not present

## 2014-10-17 DIAGNOSIS — Z8669 Personal history of other diseases of the nervous system and sense organs: Secondary | ICD-10-CM | POA: Diagnosis not present

## 2014-10-17 DIAGNOSIS — L03115 Cellulitis of right lower limb: Secondary | ICD-10-CM | POA: Diagnosis not present

## 2014-10-17 DIAGNOSIS — I10 Essential (primary) hypertension: Secondary | ICD-10-CM | POA: Insufficient documentation

## 2014-10-17 DIAGNOSIS — Z8619 Personal history of other infectious and parasitic diseases: Secondary | ICD-10-CM | POA: Diagnosis not present

## 2014-10-17 DIAGNOSIS — Z87448 Personal history of other diseases of urinary system: Secondary | ICD-10-CM | POA: Insufficient documentation

## 2014-10-17 DIAGNOSIS — Z87410 Personal history of cervical dysplasia: Secondary | ICD-10-CM | POA: Diagnosis not present

## 2014-10-17 DIAGNOSIS — M79671 Pain in right foot: Secondary | ICD-10-CM | POA: Diagnosis present

## 2014-10-17 LAB — CBC WITH DIFFERENTIAL/PLATELET
Basophils Absolute: 0 10*3/uL (ref 0.0–0.1)
Basophils Relative: 0 % (ref 0–1)
Eosinophils Absolute: 0.1 10*3/uL (ref 0.0–0.7)
Eosinophils Relative: 1 % (ref 0–5)
HCT: 29.6 % — ABNORMAL LOW (ref 36.0–46.0)
Hemoglobin: 9.5 g/dL — ABNORMAL LOW (ref 12.0–15.0)
Lymphocytes Relative: 18 % (ref 12–46)
Lymphs Abs: 2.2 10*3/uL (ref 0.7–4.0)
MCH: 30.4 pg (ref 26.0–34.0)
MCHC: 32.1 g/dL (ref 30.0–36.0)
MCV: 94.9 fL (ref 78.0–100.0)
Monocytes Absolute: 1 10*3/uL (ref 0.1–1.0)
Monocytes Relative: 9 % (ref 3–12)
Neutro Abs: 8.8 10*3/uL — ABNORMAL HIGH (ref 1.7–7.7)
Neutrophils Relative %: 72 % (ref 43–77)
Platelets: 317 10*3/uL (ref 150–400)
RBC: 3.12 MIL/uL — ABNORMAL LOW (ref 3.87–5.11)
RDW: 14 % (ref 11.5–15.5)
WBC: 12.1 10*3/uL — ABNORMAL HIGH (ref 4.0–10.5)

## 2014-10-17 LAB — SEDIMENTATION RATE: Sed Rate: 80 mm/hr — ABNORMAL HIGH (ref 0–22)

## 2014-10-17 LAB — BASIC METABOLIC PANEL
Anion gap: 11 (ref 5–15)
BUN: 31 mg/dL — ABNORMAL HIGH (ref 6–23)
CO2: 18 mmol/L — ABNORMAL LOW (ref 19–32)
Calcium: 8 mg/dL — ABNORMAL LOW (ref 8.4–10.5)
Chloride: 104 mmol/L (ref 96–112)
Creatinine, Ser: 2.55 mg/dL — ABNORMAL HIGH (ref 0.50–1.10)
GFR calc Af Amer: 22 mL/min — ABNORMAL LOW (ref >90)
GFR calc non Af Amer: 19 mL/min — ABNORMAL LOW (ref >90)
Glucose, Bld: 116 mg/dL — ABNORMAL HIGH (ref 70–99)
Potassium: 4.1 mmol/L (ref 3.5–5.1)
Sodium: 133 mmol/L — ABNORMAL LOW (ref 135–145)

## 2014-10-17 LAB — C-REACTIVE PROTEIN: CRP: 4.7 mg/dL — ABNORMAL HIGH (ref <0.60)

## 2014-10-17 MED ORDER — HYDROCODONE-ACETAMINOPHEN 5-325 MG PO TABS: 2.0000 | ORAL_TABLET | ORAL | Status: DC | PRN

## 2014-10-17 MED ORDER — HYDROCODONE-ACETAMINOPHEN 5-325 MG PO TABS
2.0000 | ORAL_TABLET | Freq: Once | ORAL | Status: AC
  Administered 2014-10-17: 2 via ORAL
  Filled 2014-10-17: qty 2

## 2014-10-17 MED ORDER — CLINDAMYCIN HCL 150 MG PO CAPS: 300.0000 mg | ORAL_CAPSULE | Freq: Three times a day (TID) | ORAL | Status: DC

## 2014-10-17 MED ORDER — LIDOCAINE HCL (PF) 1 % IJ SOLN
5.0000 mL | Freq: Once | INTRAMUSCULAR | Status: AC
  Administered 2014-10-17: 5 mL via INTRADERMAL
  Filled 2014-10-17: qty 5

## 2014-10-17 MED ORDER — SODIUM CHLORIDE 0.9 % IV BOLUS (SEPSIS)
1000.0000 mL | Freq: Once | INTRAVENOUS | Status: AC
  Administered 2014-10-17: 1000 mL via INTRAVENOUS

## 2014-10-17 NOTE — ED Provider Notes (Signed)
CSN: [id number]     Arrival date & time 10/17/14  0718 History   First MD Initiated Contact with Patient 10/17/14 0720     Chief Complaint  Patient presents with  . Foot Pain     (Consider location/radiation/quality/duration/timing/severity/associated sxs/prior Treatment) HPI Comments: Patient is a 59 year old female with a past medical history of hypertension, renal insufficiency, and UC who presents with right ankle pain for the past 5 days. Symptoms started gradually and progressively worsened since the onset. Patient reports throbbing and severe pain without radiation. Movement and weight bearing activity makes the pain worse. Patient denies injury. She reports associated swelling and warmth to the joint area. She did not try anything for pain at home. No alleviating factors.   Patient is a 59 y.o. female presenting with lower extremity pain.  Foot Pain Associated symptoms include arthralgias and joint swelling. Pertinent negatives include no abdominal pain, chest pain, chills, fatigue, fever, nausea, neck pain, vomiting or weakness.    Past Medical History  Diagnosis Date  . History of small bowel obstruction   . Obesity   . Hyperparathyroidism   . Hypokalemia   . Hypertension   . Gastric polyp   . GERD (gastroesophageal reflux disease)   . Depression   . Anxiety   . Anemia   . Renal insufficiency   . Ulcerative colitis   . RLS (restless legs syndrome)   . Pulmonary nodule, right     right upper lobe  . Anal stenosis   . Esophagitis   . De Quervain's tenosynovitis   . Hyperlipidemia   . Fibroid   . History of cervical dysplasia   . Asthma   . Hearing loss   . Visual disturbance   . Abdominal pain   . Rectal bleeding   . Blood in stool   . Rectal pain   . Difficulty urinating   . Blood in urine   . Fainting     due to dehydration  . Weakness generalized   . Fatigue   . Easy bruising   . Hemorrhoids, internal   . Hemorrhoids, external   . Ovarian cyst   .  Herpes, genital     vaginal treated 07/05/12 and pt states is resolved   Past Surgical History  Procedure Laterality Date  . Anal dilation    . 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 Ileostomy closure    . Cholecystectomy    . Tubal ligation    . Hemorrhoid surgery    . Fatty tumor removed from back    . Total abdominal hysterectomy  1998    TAH/LSO  . Cervical biopsy  w/ loop electrode excision    . Upper gastrointestinal endoscopy     Family History  Problem Relation Age of Onset  . Heart disease Father   . Ulcerative colitis Father   . Hypertension Father   . Ulcerative colitis Daughter   . Irritable bowel syndrome      grandchildren  . Colon cancer Neg Hx   . Hypertension Mother   . Heart disease Mother   . Diabetes Sister   . Cancer Sister     uterine  . Cancer Maternal Uncle     LUNG   History  Substance Use Topics  . Smoking status: Former Smoker    Types: Cigarettes    Quit date: 05/21/1979  . Smokeless tobacco: Never Used  Comment: quit age 6  . Alcohol Use: No   OB History    Gravida Para Term Preterm AB TAB SAB Ectopic Multiple Living   4 2 2  2  2   2      Review of Systems  Constitutional: Negative for fever, chills and fatigue.  HENT: Negative for trouble swallowing.   Eyes: Negative for visual disturbance.  Respiratory: Negative for shortness of breath.   Cardiovascular: Negative for chest pain and palpitations.  Gastrointestinal: Negative for nausea, vomiting, abdominal pain and diarrhea.  Genitourinary: Negative for dysuria and difficulty urinating.  Musculoskeletal: Positive for joint swelling and arthralgias. Negative for neck pain.  Skin: Positive for color change.  Neurological: Negative for dizziness and weakness.  Psychiatric/Behavioral: Negative for dysphoric mood.      Allergies  Morphine and Sulfonamide derivatives  Home Medications   Prior to Admission medications    Medication Sig Start Date End Date Taking? Authorizing Provider  albuterol (PROAIR HFA) 108 (90 BASE) MCG/ACT inhaler Inhale 2 puffs into the lungs every 6 (six) hours as needed for wheezing. 01/01/13  Yes Yvonne R Lowne, DO  CYMBALTA 60 MG capsule TAKE 2 TABLETS BY MOUTH ONCE  DAILY 06/05/14  Yes Yvonne R Lowne, DO  dicyclomine (BENTYL) 20 MG tablet TAKE 1 TABLET (20 MG TOTAL) BY MOUTH 3 (THREE) TIMES DAILY BEFORE MEALS. 08/22/14  Yes Lafayette Dragon, MD  diphenoxylate-atropine (LOMOTIL) 2.5-0.025 MG per tablet Take 1 tablet by mouth 3 (three) times daily as needed. 09/14/13  Yes Lafayette Dragon, MD  HYDROcodone-acetaminophen (NORCO/VICODIN) 5-325 MG per tablet Take 1 tablet by mouth every 6 (six) hours as needed for moderate pain. Must last 30 days 06/28/14  Yes Midge Minium, MD  mesalamine (CANASA) 1000 MG suppository Place 1 suppository (1,000 mg total) rectally at bedtime. 12/14/13  Yes Alferd Apa Lowne, DO  metoprolol succinate (TOPROL-XL) 100 MG 24 hr tablet TAKE 1/2 TABLET EVERY DAY 10/05/13  Yes Thayer Headings, MD  Multiple Vitamins-Minerals (MULTIVITAL) CHEW Chew by mouth.   Yes Historical Provider, MD  pantoprazole (PROTONIX) 40 MG tablet TAKE 1 TABLET TWICE A DAY   Yes Lafayette Dragon, MD  Simethicone (GAS-X PO) Take 1 tablet by mouth 2 (two) times daily.   Yes Historical Provider, MD  temazepam (RESTORIL) 30 MG capsule TAKE 1 CAPSULE AT BEDTIME 10/16/14  Yes Lafayette Dragon, MD  tiZANidine (ZANAFLEX) 4 MG tablet Take 4 mg by mouth every 6 (six) hours as needed for muscle spasms.   Yes Historical Provider, MD  Adalimumab (HUMIRA PEN) 40 MG/0.8ML PNKT Inject 40 mg into the skin every 14 (fourteen) days. 03/19/14   Lafayette Dragon, MD  Adalimumab (HUMIRA PEN-CROHNS STARTER) 40 MG/0.8ML PNKT Inject 40 mg into the skin as directed. Inject 160 mg (4 pens) day 0, Inject 80 mg (2 pens) day 14. Then, use maintenance dose thereafter. 03/19/14   Lafayette Dragon, MD  buPROPion (WELLBUTRIN XL) 150 MG 24 hr tablet  TAKE 1 TABLET (150 MG TOTAL) BY MOUTH DAILY. Patient not taking: Reported on 10/17/2014    Rosalita Chessman, DO  clindamycin (CLEOCIN) 2 % vaginal cream Place 1 Applicatorful vaginally at bedtime. Patient not taking: Reported on 10/17/2014 07/16/14   Terrance Mass, MD  fluconazole (DIFLUCAN) 150 MG tablet Take 1 tablet (150 mg total) by mouth once. Patient not taking: Reported on 10/17/2014 07/16/14   Terrance Mass, MD  Fluticasone-Salmeterol (ADVAIR) 250-50 MCG/DOSE AEPB Inhale 1 puff into the lungs  2 (two) times daily as needed (for wheezing or shortness of breath).  01/01/13   Rosalita Chessman, DO  HUMIRA PEN 40 MG/0.8ML PNKT Inject subcutaneously 40mg   every other week 06/14/14   Lafayette Dragon, MD   BP 117/78 mmHg  Pulse 103  Temp(Src) 97.9 F (36.6 C) (Oral)  Resp 18  SpO2 99% Physical Exam  Constitutional: She is oriented to person, place, and time. She appears well-developed and well-nourished. No distress.  HENT:  Head: Normocephalic and atraumatic.  Eyes: Conjunctivae are normal.  Neck: Normal range of motion. Neck supple.  Cardiovascular: Normal rate, regular rhythm and intact distal pulses.  Exam reveals no gallop and no friction rub.   No murmur heard. Pulmonary/Chest: Effort normal and breath sounds normal. She has no wheezes. She has no rales. She exhibits no tenderness.  Abdominal: Soft. She exhibits no distension. There is no tenderness.  Musculoskeletal:  Generalized right ankle swelling and erythema. The area is warm and tender to touch. No obvious deformity. Severely limited ROM due to pain. Patient is able to wiggle toes.   Neurological: She is alert and oriented to person, place, and time. Coordination normal.  Speech is goal-oriented. Moves limbs without ataxia.   Skin: Skin is warm and dry.  Psychiatric: She has a normal mood and affect. Her behavior is normal.  Nursing note and vitals reviewed.   ED Course  ARTHOCENTESIS Date/Time: 10/17/2014 9:23 AM Performed  by: Alvina Chou Authorized by: Alvina Chou Consent: Verbal consent obtained. Risks and benefits: risks, benefits and alternatives were discussed Consent given by: patient Patient understanding: patient states understanding of the procedure being performed Patient consent: the patient's understanding of the procedure matches consent given Patient identity confirmed: verbally with patient Time out: Immediately prior to procedure a "time out" was called to verify the correct patient, procedure, equipment, support staff and site/side marked as required. Indications: joint swelling and possible septic joint  Body area: ankle Joint: right ankle Local anesthesia used: yes Anesthesia: local infiltration Local anesthetic: lidocaine 1% without epinephrine Anesthetic total: 2 ml Patient sedated: no Preparation: Patient was prepped and draped in the usual sterile fashion. Needle gauge: 18 G Ultrasound guidance: no Approach: anterior Aspirate amount: 0 mL Patient tolerance: Patient tolerated the procedure well with no immediate complications   (including critical care time) Labs Review Labs Reviewed  CBC WITH DIFFERENTIAL/PLATELET - Abnormal; Notable for the following:    WBC 12.1 (*)    RBC 3.12 (*)    Hemoglobin 9.5 (*)    HCT 29.6 (*)    Neutro Abs 8.8 (*)    All other components within normal limits  BASIC METABOLIC PANEL - Abnormal; Notable for the following:    Sodium 133 (*)    CO2 18 (*)    Glucose, Bld 116 (*)    BUN 31 (*)    Creatinine, Ser 2.55 (*)    Calcium 8.0 (*)    GFR calc non Af Amer 19 (*)    GFR calc Af Amer 22 (*)    All other components within normal limits  SEDIMENTATION RATE - Abnormal; Notable for the following:    Sed Rate 80 (*)    All other components within normal limits  C-REACTIVE PROTEIN    Imaging Review No results found.   EKG Interpretation None      MDM   Final diagnoses:  Cellulitis of right lower extremity    9:22  AM Labs pending. Patient given vicodin for pain. Unsuccessful  ankle aspiration.   10:58 AM Patient has mildly elevated WBC at 12.1. Patient has cellulitis or gout. Patient will be treated for cellulitis to prevent worsening infection. Patient advised to return to the ED with red streaking, spreading infection, fever, or worsening or concerning symptoms. Patient instructed to follow up with her PCP as needed.   Alvina Chou, PA-C 10/17/14 [place], MD 10/18/14 [phone]

## 2014-10-17 NOTE — Discharge Instructions (Signed)
Take clindamycin as directed until gone. Take Vicodin as needed for pain. Refer to attached documents for more information. Return to the ED with worsening or concerning symptoms such as fevers, spreading infection, red streaking. Follow up with you doctor as needed.

## 2014-10-17 NOTE — ED Notes (Signed)
Pt arrives c/o right foot pain since the weekend. Denies recent injury. DP and sensation intact. States took muscle relaxers last night with some relief of pain.

## 2014-10-22 ENCOUNTER — Encounter: Payer: Self-pay | Admitting: Family Medicine

## 2014-10-22 ENCOUNTER — Other Ambulatory Visit: Payer: Self-pay

## 2014-10-22 ENCOUNTER — Telehealth: Payer: Self-pay | Admitting: Internal Medicine

## 2014-10-22 ENCOUNTER — Ambulatory Visit (INDEPENDENT_AMBULATORY_CARE_PROVIDER_SITE_OTHER): Payer: Medicare Other | Admitting: Family Medicine

## 2014-10-22 ENCOUNTER — Ambulatory Visit (HOSPITAL_BASED_OUTPATIENT_CLINIC_OR_DEPARTMENT_OTHER)
Admission: RE | Admit: 2014-10-22 | Discharge: 2014-10-22 | Disposition: A | Discharge status: Home / Self Care | Payer: Medicare Other | Source: Ambulatory Visit | Attending: Family Medicine | Admitting: Family Medicine

## 2014-10-22 VITALS — BP 144/80 | HR 122 | Temp 99.4°F | Wt 181.4 lb

## 2014-10-22 DIAGNOSIS — M19071 Primary osteoarthritis, right ankle and foot: Secondary | ICD-10-CM | POA: Diagnosis not present

## 2014-10-22 DIAGNOSIS — Z0000 Encounter for general adult medical examination without abnormal findings: Secondary | ICD-10-CM

## 2014-10-22 DIAGNOSIS — Z9189 Other specified personal risk factors, not elsewhere classified: Secondary | ICD-10-CM

## 2014-10-22 DIAGNOSIS — M7989 Other specified soft tissue disorders: Secondary | ICD-10-CM | POA: Insufficient documentation

## 2014-10-22 DIAGNOSIS — M79671 Pain in right foot: Secondary | ICD-10-CM | POA: Insufficient documentation

## 2014-10-22 DIAGNOSIS — M10071 Idiopathic gout, right ankle and foot: Secondary | ICD-10-CM

## 2014-10-22 DIAGNOSIS — L03119 Cellulitis of unspecified part of limb: Secondary | ICD-10-CM

## 2014-10-22 LAB — URIC ACID: Uric Acid, Serum: 7.4 mg/dL — ABNORMAL HIGH (ref 2.4–7.0)

## 2014-10-22 IMAGING — DX DG FOOT COMPLETE 3+V*R*
3 series · 3 of 3 positions shown · non-contrast
Comparison: None.

CLINICAL DATA: Patient with anterior swelling and foot pain for 2
weeks. No known injury.

EXAM:
RIGHT FOOT COMPLETE - 3+ VIEW

[foot ap]
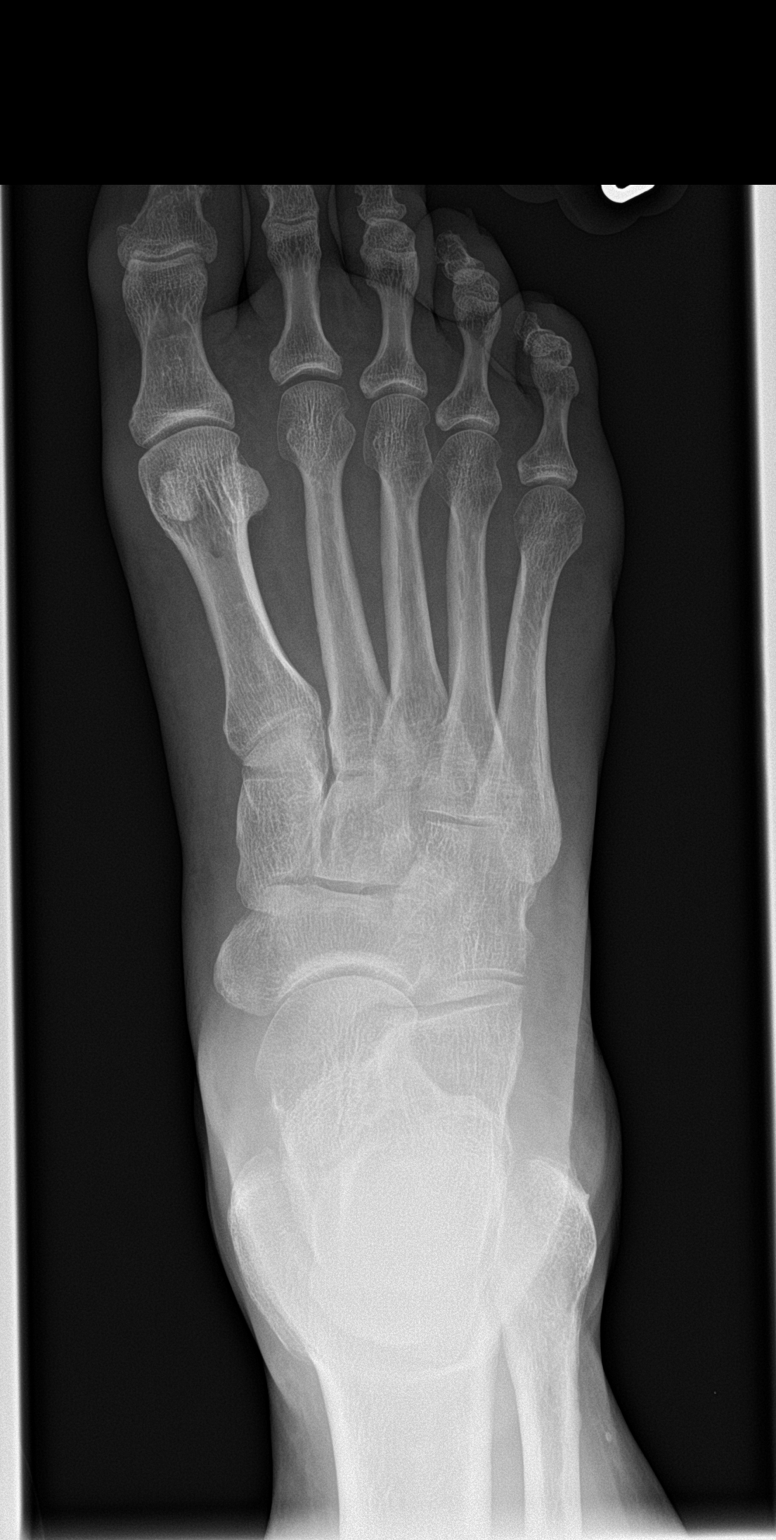

[foot obl]
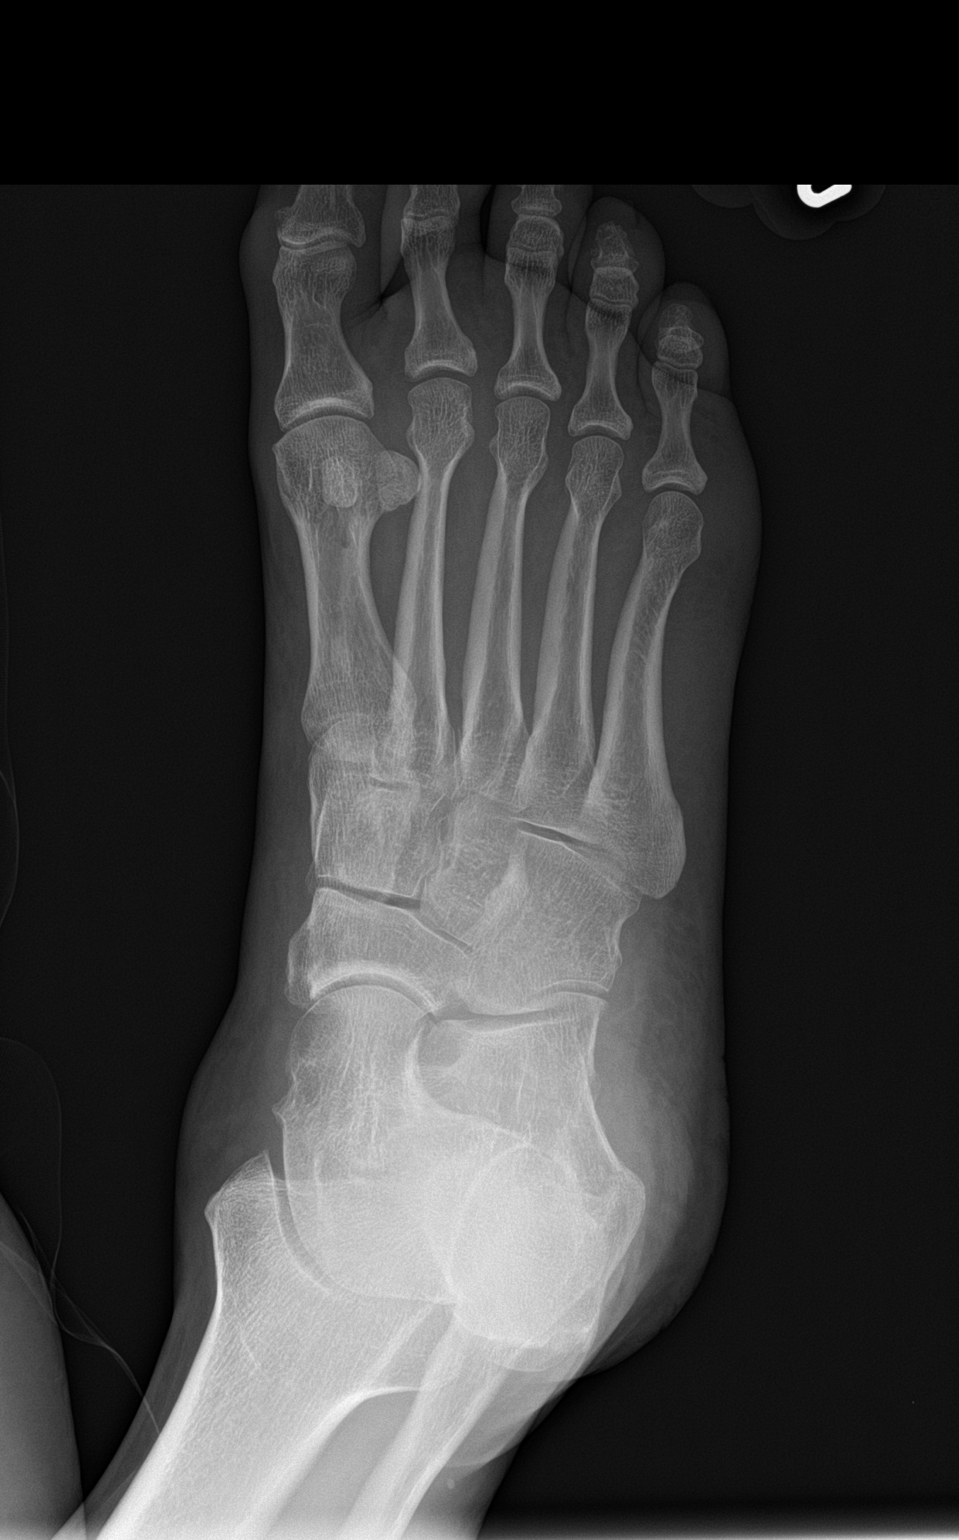

[foot lat]
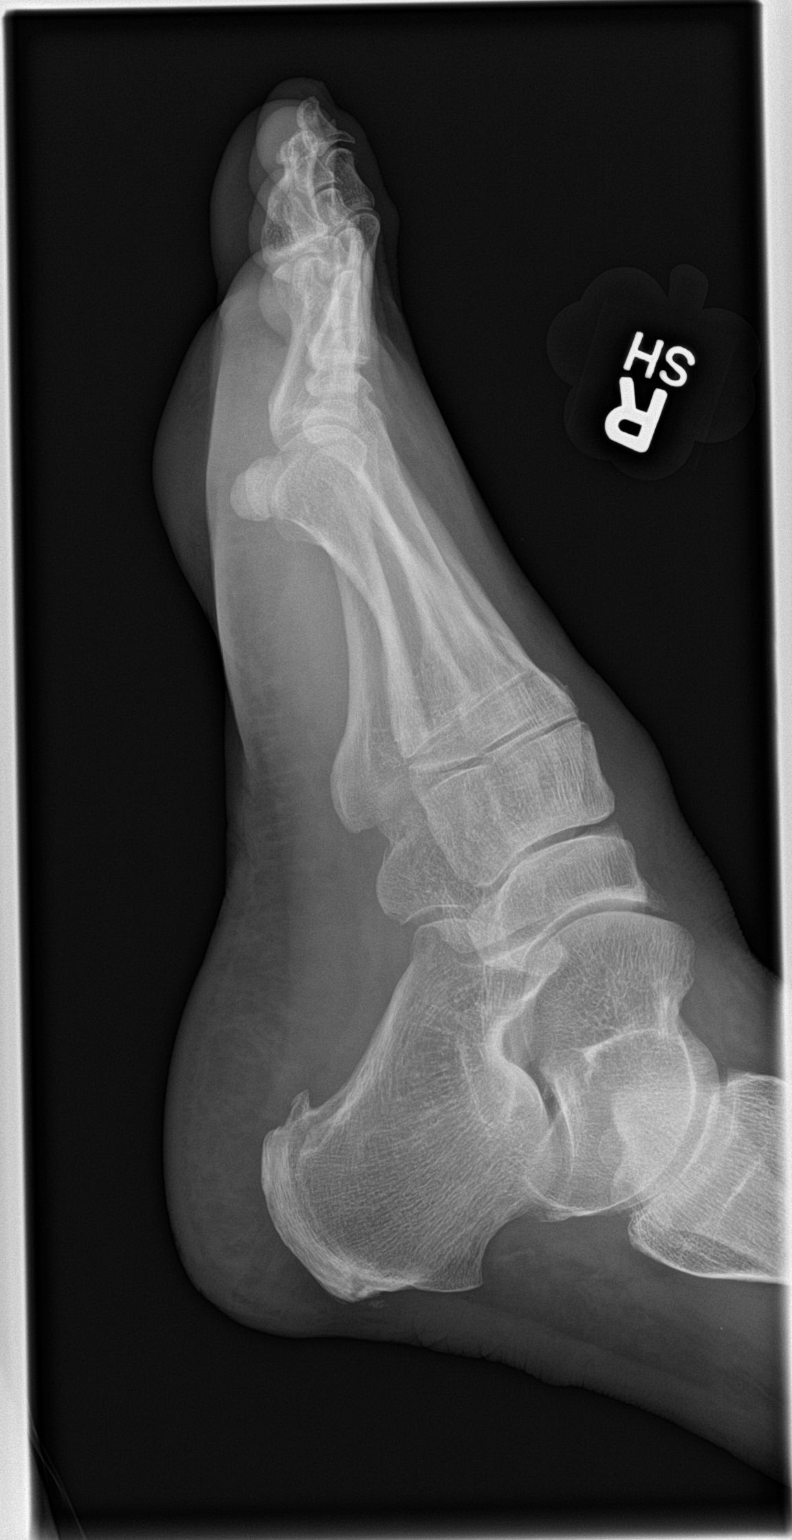

[3 of 3 positions shown; findings below may reference images not displayed]

FINDINGS: Normal anatomic alignment. No evidence for acute fracture or
dislocation. No erosive changes. Plantar calcaneal spurring. Midfoot
degenerative changes.
IMPRESSION: No acute osseous abnormality.

## 2014-10-22 MED ORDER — FEBUXOSTAT 40 MG PO TABS
40.0000 mg | ORAL_TABLET | Freq: Every day | ORAL | Status: DC
Start: 2014-10-22 — End: 2014-11-07

## 2014-10-22 MED ORDER — LEVOFLOXACIN 250 MG PO TABS: ORAL_TABLET | ORAL | Status: DC

## 2014-10-22 MED ORDER — HYDROCODONE-ACETAMINOPHEN 5-325 MG PO TABS: 2.0000 | ORAL_TABLET | ORAL | Status: DC | PRN

## 2014-10-22 NOTE — Progress Notes (Signed)
Pre visit review using our clinic review tool, if applicable. No additional management support is needed unless otherwise documented below in the visit note. 

## 2014-10-22 NOTE — Patient Instructions (Signed)

## 2014-10-22 NOTE — Progress Notes (Signed)
Subjective:    Patient ID: [name], female    DOB: [birth year], 59 y.o.   MRN: [id number]  HPI  Patient here for  F/u pain in R foot x several days. She went to ER --- er note reviewed.  She was put on abx but it is causing diarrhea.   Past Medical History  Diagnosis Date  . History of small bowel obstruction   . Obesity   . Hyperparathyroidism   . Hypokalemia   . Hypertension   . Gastric polyp   . GERD (gastroesophageal reflux disease)   . Depression   . Anxiety   . Anemia   . Renal insufficiency   . Ulcerative colitis   . RLS (restless legs syndrome)   . Pulmonary nodule, [name]     [name] upper lobe  . Anal stenosis   . Esophagitis   . De Quervain's tenosynovitis   . Hyperlipidemia   . Fibroid   . History of cervical dysplasia   . Asthma   . Hearing loss   . Visual disturbance   . Abdominal pain   . Rectal bleeding   . Blood in stool   . Rectal pain   . Difficulty urinating   . Blood in urine   . Fainting     due to dehydration  . Weakness generalized   . Fatigue   . Easy bruising   . Hemorrhoids, internal   . Hemorrhoids, external   . Ovarian cyst   . Herpes, genital     vaginal treated 07/05/12 and pt states is resolved    Review of Systems  Constitutional: Negative for activity change, appetite change, fatigue and unexpected weight change.  Respiratory: Negative for cough and shortness of breath.   Cardiovascular: Negative for chest pain and palpitations.  Psychiatric/Behavioral: Negative for suicidal ideas, hallucinations, behavioral problems, confusion, sleep disturbance, self-injury, dysphoric mood, decreased concentration and agitation. The patient is not nervous/anxious and is not hyperactive.        Objective:    Physical Exam  Constitutional: She is oriented to person, place, and time. She appears well-developed and well-nourished. No distress.  HENT:  [name] Ear: External ear normal.  Left Ear: External ear normal.  Nose: Nose  normal.  Mouth/Throat: Oropharynx is clear and moist.  Eyes: EOM are normal. Pupils are equal, round, and reactive to light.  Neck: Normal range of motion. Neck supple.  Cardiovascular: Normal rate, regular rhythm and normal heart sounds.   No murmur heard. Pulmonary/Chest: Effort normal and breath sounds normal. No respiratory distress. She has no wheezes. She has no rales. She exhibits no tenderness.  Musculoskeletal: She exhibits tenderness.  Neurological: She is alert and oriented to person, place, and time.  Skin: There is erythema.  r foot swollen, warm to touch and tender Pt with pain on dorsi flexion of foot and weight bearing  Psychiatric: She has a normal mood and affect. Her behavior is normal. Judgment and thought content normal.    BP 144/80 mmHg  Pulse 122  Temp(Src) 99.4 F (37.4 C) (Oral)  Wt 181 lb 6.4 oz (82.283 kg)  SpO2 99% Wt Readings from Last 3 Encounters:  10/22/14 181 lb 6.4 oz (82.283 kg)  05/28/14 184 lb 2 oz (83.519 kg)  03/07/14 183 lb (83.008 kg)     Lab Results  Component Value Date   WBC 12.1* 10/17/2014   HGB 9.5* 10/17/2014   HCT 29.6* 10/17/2014   PLT 317 10/17/2014   GLUCOSE  116* 10/17/2014   CHOL 191 10/16/2007   TRIG 142 10/16/2007   HDL 38.1* 10/16/2007   LDLDIRECT 127.0 09/19/2006   LDLCALC 124* 10/16/2007   ALT 14 04/12/2013   AST 16 04/12/2013   NA 133* 10/17/2014   K 4.1 10/17/2014   CL 104 10/17/2014   CREATININE 2.55* 10/17/2014   BUN 31* 10/17/2014   CO2 18* 10/17/2014   TSH 2.20 02/04/2009    Dg Foot Complete [name]  10/22/2014   CLINICAL DATA:  Patient with anterior swelling and foot pain for 2 weeks. No known injury.  EXAM: [name] FOOT COMPLETE - 3+ VIEW  COMPARISON:  None.  FINDINGS: Normal anatomic alignment. No evidence for acute fracture or dislocation. No erosive changes. Plantar calcaneal spurring. Midfoot degenerative changes.  IMPRESSION: No acute osseous abnormality.   Electronically Signed   By: Lovey Newcomer  M.D.   On: 10/22/2014 16:52       Assessment & Plan:   Problem List Items Addressed This Visit    None    Visit Diagnoses    Acute idiopathic gout of [name] foot    -  Primary    Relevant Medications    HYDROcodone-acetaminophen (NORCO/VICODIN) 5-325 MG per tablet    Other Relevant Orders    Uric acid (Completed)    DG Foot Complete [name] (Completed)    Cellulitis of foot        Relevant Medications    levofloxacin (LEVAQUIN) tablet    Preventative health care        Relevant Orders    Quantiferon tb gold assay    At risk for side effect of medication        Relevant Orders    Quantiferon tb gold assay        Garnet Koyanagi, DO

## 2014-10-23 NOTE — Telephone Encounter (Signed)
Spoke with patient and she  Has gout and will not be taking Humira on Monday.

## 2014-10-23 NOTE — Telephone Encounter (Signed)
OK, but restart as soon as the severe pain is gone.

## 2014-10-23 NOTE — Telephone Encounter (Signed)
Patient notified of recommendations. 

## 2014-10-28 LAB — QUANTIFERON TB GOLD ASSAY (BLOOD)
Mitogen value: 0.22 IU/mL
Quantiferon Nil Value: 0.04 IU/mL
Quantiferon Tb Ag Minus Nil Value: 0 IU/mL
TB Ag value: 0.03 IU/mL

## 2014-10-29 ENCOUNTER — Encounter: Payer: Self-pay | Admitting: Family Medicine

## 2014-10-29 ENCOUNTER — Ambulatory Visit (INDEPENDENT_AMBULATORY_CARE_PROVIDER_SITE_OTHER): Payer: Medicare Other | Admitting: Family Medicine

## 2014-10-29 VITALS — BP 132/80 | HR 123 | Temp 99.4°F | Wt 179.0 lb

## 2014-10-29 DIAGNOSIS — M10371 Gout due to renal impairment, right ankle and foot: Secondary | ICD-10-CM | POA: Diagnosis not present

## 2014-10-29 DIAGNOSIS — T887XXD Unspecified adverse effect of drug or medicament, subsequent encounter: Secondary | ICD-10-CM | POA: Diagnosis not present

## 2014-10-29 DIAGNOSIS — T50905D Adverse effect of unspecified drugs, medicaments and biological substances, subsequent encounter: Secondary | ICD-10-CM

## 2014-10-29 DIAGNOSIS — M109 Gout, unspecified: Secondary | ICD-10-CM | POA: Insufficient documentation

## 2014-10-29 NOTE — Assessment & Plan Note (Signed)
On uloric Pain and swelling are much better Repeat uric acid in 1 month

## 2014-10-29 NOTE — Progress Notes (Signed)
Subjective:    Patient ID: [name], female    DOB: 12/17/1955, 59 y.o.   MRN: [id number]  HPI  Patient here f/u gout and cellulitis---- pain and reddness almost completely gone after starting uloric.   We need to re check tb test too---- test indeterminate  Past Medical History  Diagnosis Date  . History of small bowel obstruction   . Obesity   . Hyperparathyroidism   . Hypokalemia   . Hypertension   . Gastric polyp   . GERD (gastroesophageal reflux disease)   . Depression   . Anxiety   . Anemia   . Renal insufficiency   . Ulcerative colitis   . RLS (restless legs syndrome)   . Pulmonary nodule, [name]     [name] upper lobe  . Anal stenosis   . Esophagitis   . De Quervain's tenosynovitis   . Hyperlipidemia   . Fibroid   . History of cervical dysplasia   . Asthma   . Hearing loss   . Visual disturbance   . Abdominal pain   . Rectal bleeding   . Blood in stool   . Rectal pain   . Difficulty urinating   . Blood in urine   . Fainting     due to dehydration  . Weakness generalized   . Fatigue   . Easy bruising   . Hemorrhoids, internal   . Hemorrhoids, external   . Ovarian cyst   . Herpes, genital     vaginal treated 07/05/12 and pt states is resolved    Review of Systems  Constitutional: Negative for activity change, appetite change, fatigue and unexpected weight change.  Respiratory: Negative for cough and shortness of breath.   Cardiovascular: Negative for chest pain and palpitations.  Musculoskeletal: Negative for myalgias, joint swelling, arthralgias and gait problem.  Psychiatric/Behavioral: Negative for behavioral problems and dysphoric mood. The patient is not nervous/anxious.        Objective:    Physical Exam  Constitutional: She is oriented to person, place, and time. She appears well-developed and well-nourished. No distress.  HENT:  [name] Ear: External ear normal.  Left Ear: External ear normal.  Nose: Nose normal.  Mouth/Throat:  Oropharynx is clear and moist.  Eyes: EOM are normal. Pupils are equal, round, and reactive to light.  Neck: Normal range of motion. Neck supple.  Cardiovascular: Normal rate, regular rhythm and normal heart sounds.   No murmur heard. Pulmonary/Chest: Effort normal and breath sounds normal. No respiratory distress. She has no wheezes. She has no rales. She exhibits no tenderness.  Musculoskeletal: Normal range of motion. She exhibits no edema or tenderness.  Neurological: She is alert and oriented to person, place, and time.  Psychiatric: She has a normal mood and affect. Her behavior is normal. Judgment and thought content normal.    BP 132/80 mmHg  Pulse 123  Temp(Src) 99.4 F (37.4 C) (Oral)  Wt 179 lb (81.194 kg)  SpO2 98% Wt Readings from Last 3 Encounters:  10/29/14 179 lb (81.194 kg)  10/22/14 181 lb 6.4 oz (82.283 kg)  05/28/14 184 lb 2 oz (83.519 kg)     Lab Results  Component Value Date   WBC 12.1* 10/17/2014   HGB 9.5* 10/17/2014   HCT 29.6* 10/17/2014   PLT 317 10/17/2014   GLUCOSE 116* 10/17/2014   CHOL 191 10/16/2007   TRIG 142 10/16/2007   HDL 38.1* 10/16/2007   LDLDIRECT 127.0 09/19/2006   LDLCALC 124* 10/16/2007  ALT 14 04/12/2013   AST 16 04/12/2013   NA 133* 10/17/2014   K 4.1 10/17/2014   CL 104 10/17/2014   CREATININE 2.55* 10/17/2014   BUN 31* 10/17/2014   CO2 18* 10/17/2014   TSH 2.20 02/04/2009    Dg Foot Complete [name]  10/22/2014   CLINICAL DATA:  Patient with anterior swelling and foot pain for 2 weeks. No known injury.  EXAM: [name] FOOT COMPLETE - 3+ VIEW  COMPARISON:  None.  FINDINGS: Normal anatomic alignment. No evidence for acute fracture or dislocation. No erosive changes. Plantar calcaneal spurring. Midfoot degenerative changes.  IMPRESSION: No acute osseous abnormality.   Electronically Signed   By: Lovey Newcomer M.D.   On: 10/22/2014 16:52       Assessment & Plan:   Problem List Items Addressed This Visit    Gout of foot    On  uloric Pain and swelling are much better Repeat uric acid in 1 month       Other Visit Diagnoses    Medication adverse effect, subsequent encounter    -  Primary    Relevant Orders    Quantiferon tb gold assay        Garnet Koyanagi, DO

## 2014-10-29 NOTE — Patient Instructions (Signed)

## 2014-10-30 ENCOUNTER — Telehealth: Payer: Self-pay | Admitting: *Deleted

## 2014-10-30 DIAGNOSIS — K51219 Ulcerative (chronic) proctitis with unspecified complications: Secondary | ICD-10-CM

## 2014-10-30 NOTE — Telephone Encounter (Signed)
Prior authorization for Cymbalta initiated. Awaiting determination. JG//CMA

## 2014-10-30 NOTE — Telephone Encounter (Signed)
Please apply TB skin test And read in 48 hours.    ----- Message -----     From: Hulan Saas, RN     Sent: 10/29/2014  8:39 AM      To: Lafayette Dragon, MD    Spoke with MD and patient should come for CXR. Patient notified and she will come.

## 2014-10-31 NOTE — Telephone Encounter (Signed)
PA approved effective through 09/06/2015. JG//CMA

## 2014-11-01 LAB — QUANTIFERON TB GOLD ASSAY (BLOOD)
Mitogen value: 0.21 IU/mL
Quantiferon Nil Value: 0.03 IU/mL
Quantiferon Tb Ag Minus Nil Value: 0 IU/mL
TB Ag value: 0.03 IU/mL

## 2014-11-04 ENCOUNTER — Telehealth: Payer: Self-pay | Admitting: Family Medicine

## 2014-11-04 ENCOUNTER — Ambulatory Visit (INDEPENDENT_AMBULATORY_CARE_PROVIDER_SITE_OTHER)
Admission: RE | Admit: 2014-11-04 | Discharge: 2014-11-04 | Disposition: A | Discharge status: Home / Self Care | Payer: Medicare Other | Source: Ambulatory Visit | Attending: Internal Medicine | Admitting: Internal Medicine

## 2014-11-04 DIAGNOSIS — I10 Essential (primary) hypertension: Secondary | ICD-10-CM | POA: Diagnosis not present

## 2014-11-04 DIAGNOSIS — N182 Chronic kidney disease, stage 2 (mild): Secondary | ICD-10-CM | POA: Diagnosis not present

## 2014-11-04 DIAGNOSIS — M109 Gout, unspecified: Secondary | ICD-10-CM

## 2014-11-04 DIAGNOSIS — K51219 Ulcerative (chronic) proctitis with unspecified complications: Secondary | ICD-10-CM

## 2014-11-04 DIAGNOSIS — Z111 Encounter for screening for respiratory tuberculosis: Secondary | ICD-10-CM | POA: Diagnosis not present

## 2014-11-04 IMAGING — CR DG CHEST 2V
2 series · 2 of 2 positions shown · non-contrast
Comparison: [DATE].

CLINICAL DATA: TB screening.  Hypertension.

EXAM:
CHEST  2 VIEW

[view not recorded (1 of 2)]
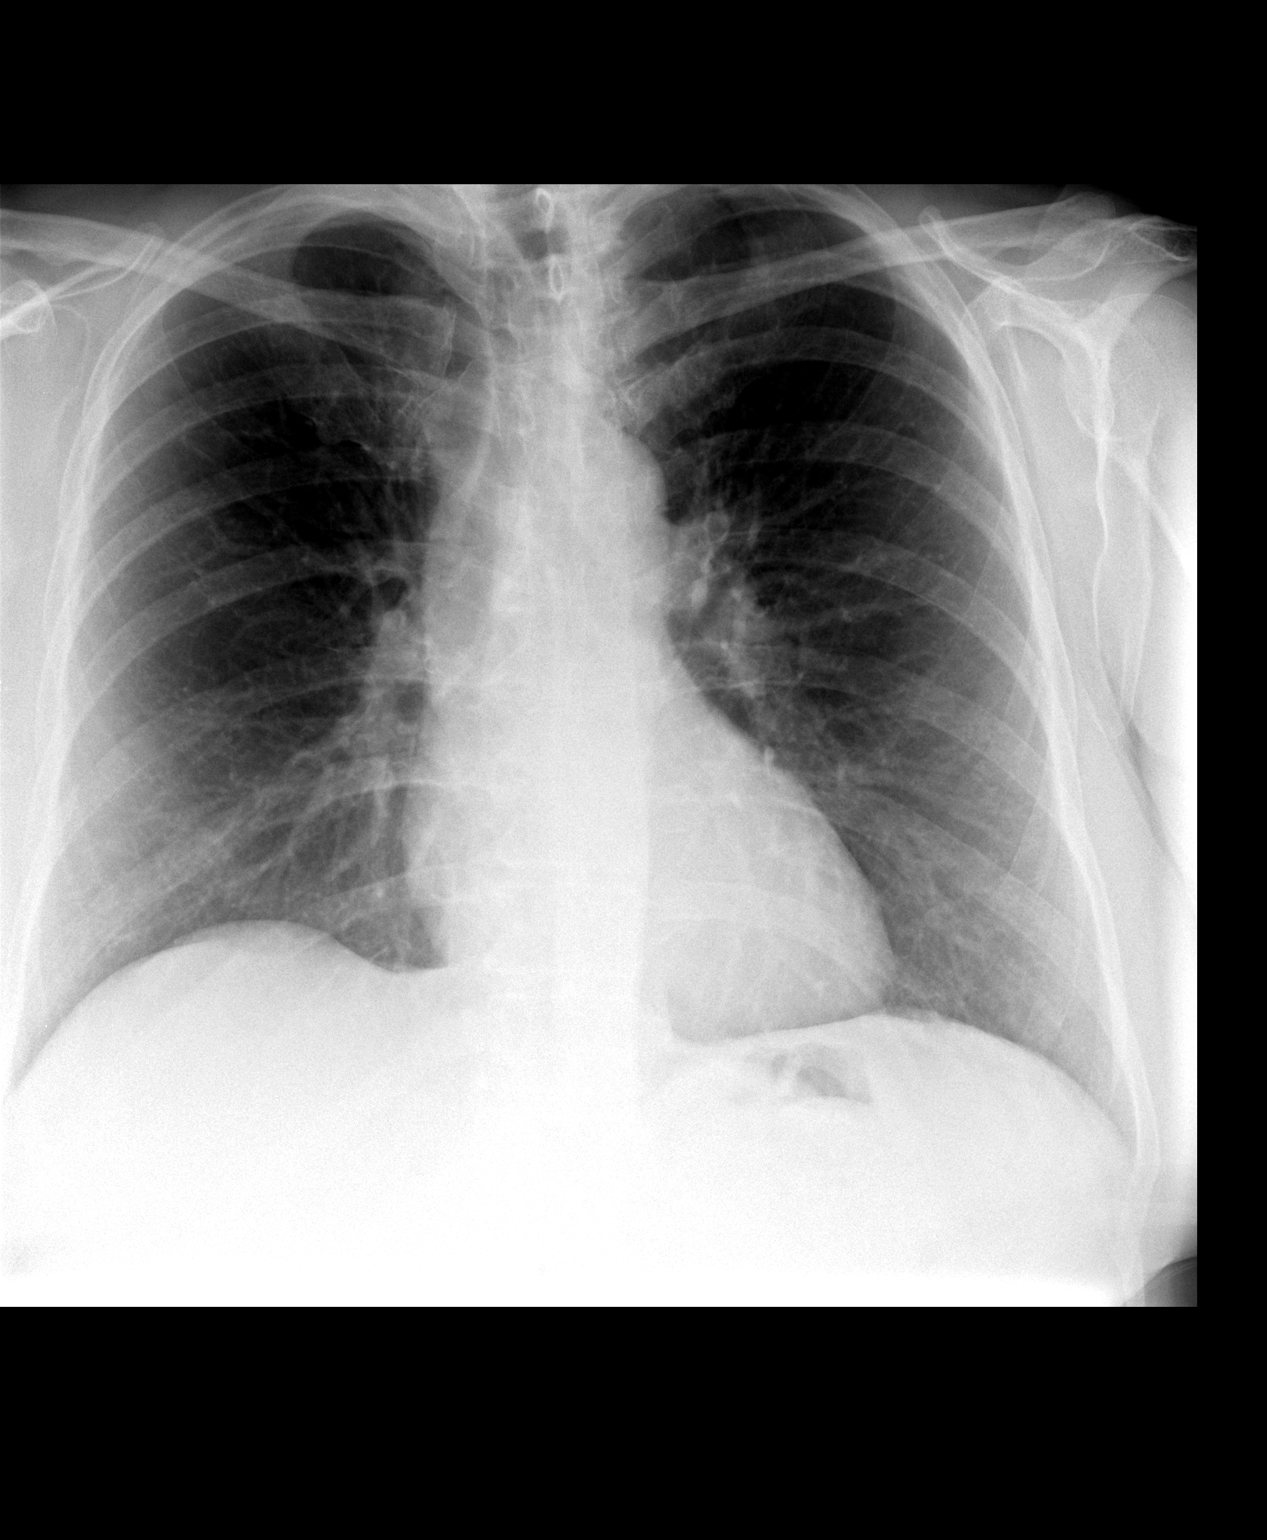

[view not recorded (2 of 2)]
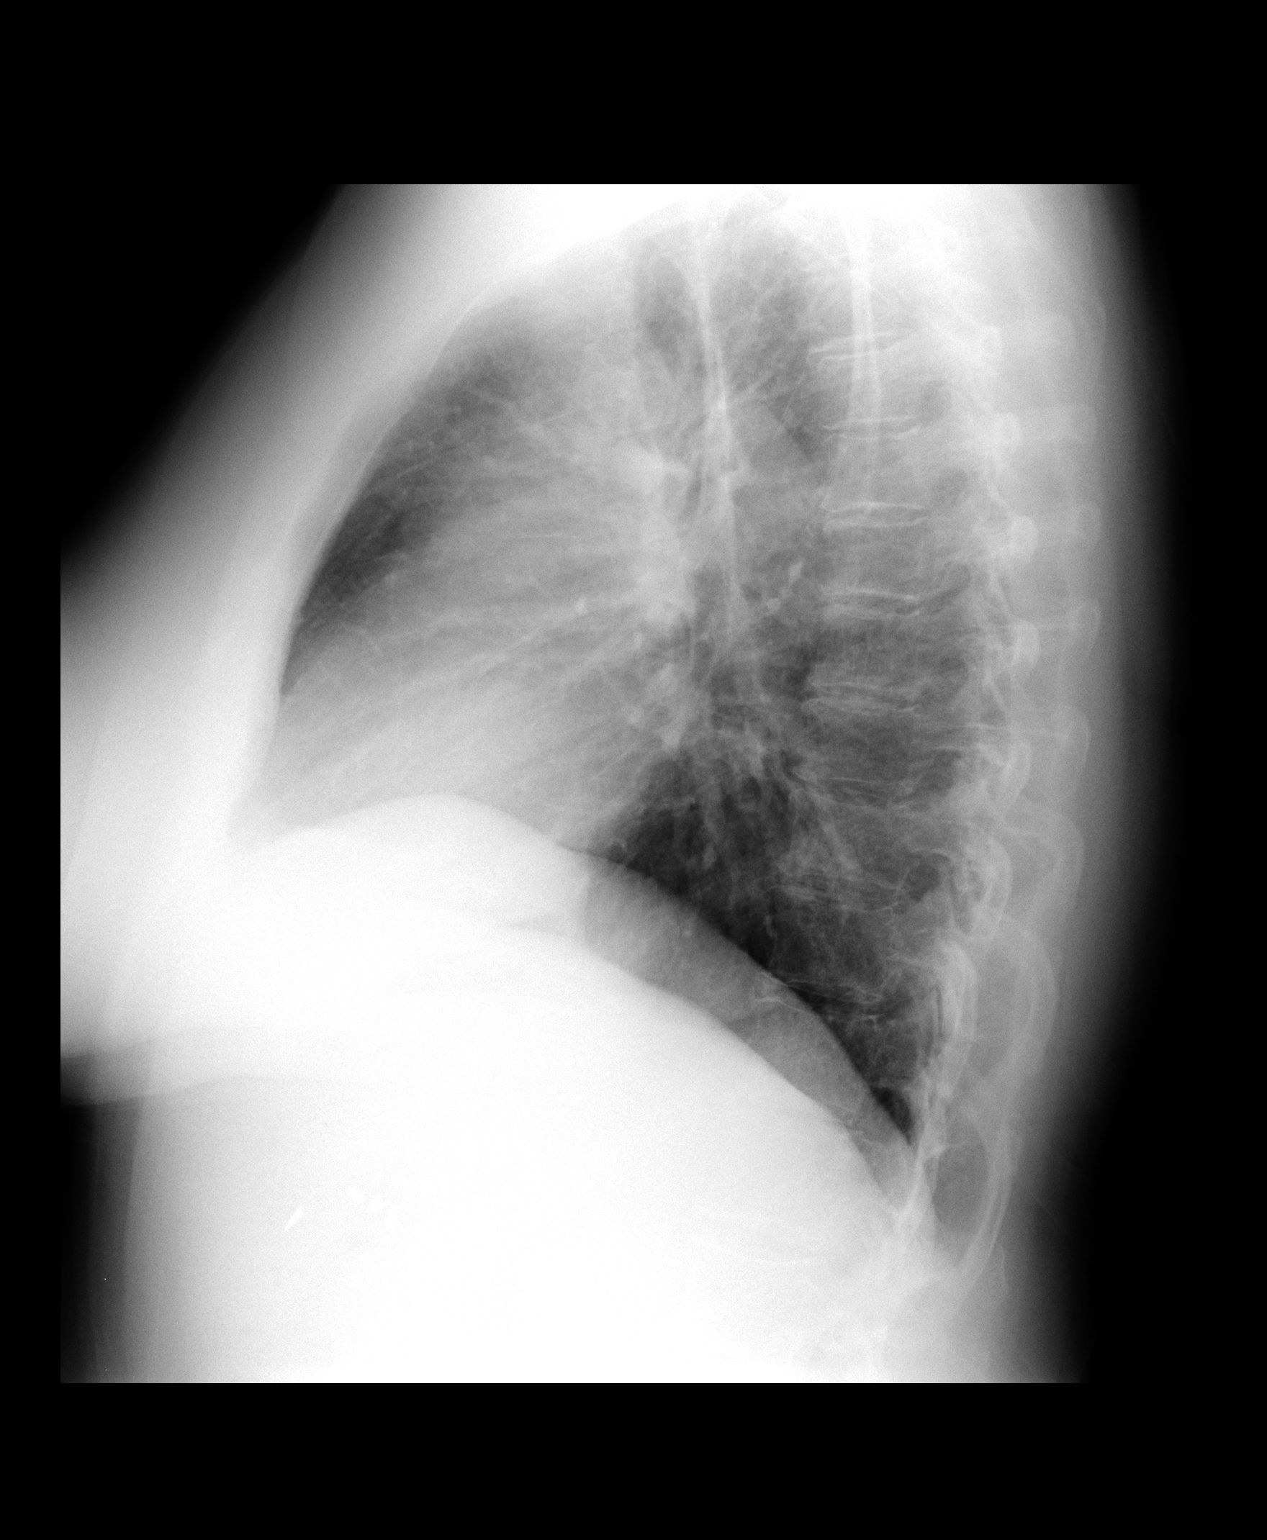

[2 of 2 positions shown; findings below may reference images not displayed]

FINDINGS: Mediastinum and hilar structures normal. Lungs are clear. No pleural
effusion or pneumothorax. Surgical clips right upper quadrant. Chest
is stable from [DATE].
IMPRESSION: No acute cardiopulmonary disease.

## 2014-11-04 NOTE — Telephone Encounter (Signed)
Caller name:Audrea Muscat Sproles Relationship to patient:Self Can be [phone] Pharmacy: [facility] [address]  Reason for call: Pt states hurt foot over the weekend and swelling is worse. Wanted to know if Dr. Etter Sjogren would like her to continue with febuxostat (ULORIC) 40 MG tablet or if she would like to try something different.

## 2014-11-04 NOTE — Telephone Encounter (Signed)
Please advise      KP 

## 2014-11-04 NOTE — Telephone Encounter (Signed)
Recheck uric acid--- we may need to inc it

## 2014-11-05 ENCOUNTER — Other Ambulatory Visit (INDEPENDENT_AMBULATORY_CARE_PROVIDER_SITE_OTHER): Payer: Medicare Other

## 2014-11-05 DIAGNOSIS — M10071 Idiopathic gout, right ankle and foot: Secondary | ICD-10-CM | POA: Diagnosis not present

## 2014-11-05 DIAGNOSIS — M109 Gout, unspecified: Secondary | ICD-10-CM

## 2014-11-05 LAB — URIC ACID: Uric Acid, Serum: 6.1 mg/dL (ref 2.4–7.0)

## 2014-11-05 NOTE — Telephone Encounter (Signed)
Patient verbalized understanding. Apt scheduled for today.     KP

## 2014-11-06 ENCOUNTER — Telehealth: Payer: Self-pay | Admitting: Family Medicine

## 2014-11-06 DIAGNOSIS — M109 Gout, unspecified: Secondary | ICD-10-CM

## 2014-11-06 NOTE — Telephone Encounter (Signed)
Caller name:Audrea Muscat Going Relationship to patient:self Can be [phone] Pharmacy:  Reason for call:requesting lab results

## 2014-11-07 ENCOUNTER — Telehealth: Payer: Self-pay | Admitting: Family Medicine

## 2014-11-07 MED ORDER — FEBUXOSTAT 40 MG PO TABS: 80.0000 mg | ORAL_TABLET | Freq: Every day | ORAL | Status: DC

## 2014-11-07 NOTE — Telephone Encounter (Signed)
Caller name: [name] Relation to pt: self Call back [phone] Pharmacy:  Reason for call:   Patient wants to know if she can start taking humira again?

## 2014-11-07 NOTE — Addendum Note (Signed)
Addended by: Ewing Schlein on: 11/07/2014 12:27 PM   Modules accepted: Orders

## 2014-11-07 NOTE — Telephone Encounter (Signed)
Patient has been made aware and verbalized understanding. Rx updated but not sent at this time.      KP

## 2014-11-07 NOTE — Telephone Encounter (Signed)
Can increase uloric to 80 mg daily---kim will call it in tomorrow but you can take 2 of what you have left over---recheck 1 month--- uric acid, bmp Next step would be rhematology referral if it does not improve

## 2014-11-08 ENCOUNTER — Telehealth: Payer: Self-pay | Admitting: Internal Medicine

## 2014-11-08 NOTE — Telephone Encounter (Signed)
Informed patient of this.

## 2014-11-08 NOTE — Telephone Encounter (Signed)
Please advise      KP 

## 2014-11-08 NOTE — Telephone Encounter (Signed)
If abx done she can start

## 2014-11-08 NOTE — Telephone Encounter (Signed)
Msg left to call the office     KP 

## 2014-11-08 NOTE — Telephone Encounter (Signed)
Left message for pt to call back  °

## 2014-11-11 NOTE — Telephone Encounter (Signed)
Spoke with patient and she states she started back on Humira Friday night.

## 2014-11-11 NOTE — Telephone Encounter (Signed)
OK to restart Humira

## 2014-11-15 ENCOUNTER — Telehealth: Payer: Self-pay | Admitting: Family Medicine

## 2014-11-15 MED ORDER — FEBUXOSTAT 40 MG PO TABS: 80.0000 mg | ORAL_TABLET | Freq: Every day | ORAL | Status: DC

## 2014-11-15 NOTE — Telephone Encounter (Signed)
Caller name: Salwa ann Relation to pt: self Call back number: [phone] Pharmacy: [facility] [address]  Reason for call:   Patient is out of gout medication and is requesting a refill. She states at last visit Dr. Etter Sjogren increased to two daily.

## 2014-11-15 NOTE — Telephone Encounter (Signed)
error:[id number] ° °

## 2014-11-15 NOTE — Telephone Encounter (Signed)
Med filled and sent to [facility] on [address]

## 2014-11-17 ENCOUNTER — Other Ambulatory Visit: Payer: Self-pay | Admitting: Cardiovascular Disease

## 2014-11-18 ENCOUNTER — Telehealth: Payer: Self-pay | Admitting: Family Medicine

## 2014-11-18 ENCOUNTER — Telehealth: Payer: Self-pay | Admitting: *Deleted

## 2014-11-18 NOTE — Telephone Encounter (Signed)
Prior authorization for Uloric initiated. Awaiting determination. JG//CMA

## 2014-11-18 NOTE — Telephone Encounter (Signed)
Per previous phone note, PA is already in progress

## 2014-11-18 NOTE — Telephone Encounter (Signed)
Caller name:Pellegrino, Audrea Muscat Relation to EM:[id number] Call back number:[phone] Pharmacy:[facility]  Reason for call: pt states the pharmacy informed her that she needs PA on rx febuxostat (ULORIC) 40 MG tablet, pt states she will be completely out today, and need to know what her alternatives are until she gets the PA approved. Requesting a call back.

## 2014-11-19 NOTE — Telephone Encounter (Signed)
PA approved effective through 09/06/2015. JG//CMA

## 2014-11-22 ENCOUNTER — Encounter: Payer: Self-pay | Admitting: Cardiovascular Disease

## 2014-11-22 ENCOUNTER — Encounter: Payer: Self-pay | Admitting: Nurse Practitioner

## 2014-11-22 ENCOUNTER — Ambulatory Visit (INDEPENDENT_AMBULATORY_CARE_PROVIDER_SITE_OTHER): Payer: Medicare Other | Admitting: Cardiovascular Disease

## 2014-11-22 VITALS — BP 120/90 | HR 82 | Ht 63.0 in | Wt 176.4 lb

## 2014-11-22 DIAGNOSIS — Z1231 Encounter for screening mammogram for malignant neoplasm of breast: Secondary | ICD-10-CM | POA: Diagnosis not present

## 2014-11-22 DIAGNOSIS — I10 Essential (primary) hypertension: Secondary | ICD-10-CM

## 2014-11-22 DIAGNOSIS — E785 Hyperlipidemia, unspecified: Secondary | ICD-10-CM

## 2014-11-22 LAB — LIPID PANEL
Cholesterol: 199 mg/dL (ref 0–200)
HDL: 45.8 mg/dL (ref >39.00)
LDL Cholesterol: 122 mg/dL — ABNORMAL HIGH (ref 0–99)
NonHDL: 153.2
Total CHOL/HDL Ratio: 4
Triglycerides: 156 mg/dL — ABNORMAL HIGH (ref 0.0–149.0)
VLDL: 31.2 mg/dL (ref 0.0–40.0)

## 2014-11-22 LAB — HEPATIC FUNCTION PANEL
ALT: 11 U/L (ref 0–35)
AST: 17 U/L (ref 0–37)
Albumin: 3.8 g/dL (ref 3.5–5.2)
Alkaline Phosphatase: 75 U/L (ref 39–117)
Bilirubin, Direct: 0.1 mg/dL (ref 0.0–0.3)
Total Bilirubin: 0.3 mg/dL (ref 0.2–1.2)
Total Protein: 8.1 g/dL (ref 6.0–8.3)

## 2014-11-22 LAB — BASIC METABOLIC PANEL
BUN: 43 mg/dL — ABNORMAL HIGH (ref 6–23)
CO2: 20 mEq/L (ref 19–32)
Calcium: 9 mg/dL (ref 8.4–10.5)
Chloride: 108 mEq/L (ref 96–112)
Creatinine, Ser: 2.6 mg/dL — ABNORMAL HIGH (ref 0.40–1.20)
GFR: 19.89 mL/min — ABNORMAL LOW (ref >60.00)
Glucose, Bld: 99 mg/dL (ref 70–99)
Potassium: 4 mEq/L (ref 3.5–5.1)
Sodium: 134 mEq/L — ABNORMAL LOW (ref 135–145)

## 2014-11-22 NOTE — Progress Notes (Signed)
Cardiology Office Note   Date:  11/22/2014   ID:  [name], DOB 31-Aug-1956, MRN [id number]  PCP:  Garnet Koyanagi, DO  Cardiologist:   Acie Fredrickson Wonda Cheng, MD   No chief complaint on file.  1. Hypertension 2. Ulcerative colitis- s/p total colectomy. 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 Hyperlipidemia 4. Chest pain-stress Myoview study in 2008 was normal   History of Present Illness: [name] is seen today for a follow up visit. She is seen for Dr. Acie Fredrickson. She has not been seen in about 2 years. She has HTN, ulcerative colitis with extensive GI issues, HLD and chest pain. Remote Myoview back in 2008 was satisfactory.   She comes in today. She is here alone. She is doing well. She says her chest pain is unchanged over the last several years and really nothing that worries her. She has no exertional symptoms. She is not short of breath. She needs her Metoprolol refilled. No shortness of breath. Not as active and her weight is up. She continues to have significant GI issues. Still with some stool leakage and has an internal J pouch. She sees nephrololgy for an elevated creatinine and is due to have her physical soon. She really has no complaint and thinks she is doing well.   Jan. 30, 2015;  [name] is doing OK. We have given her metoprolol for a resting tachycardia. No problems with tachycardia or hypertension at this point.    November 22, 2014:   [name] is a 59 y.o. female who presents for follow up of her HTN . No further episodes of CP    Past Medical History  Diagnosis Date  . History of small bowel obstruction   . Obesity   . Hyperparathyroidism   . Hypokalemia   . Hypertension   . Gastric polyp   . GERD (gastroesophageal reflux disease)   . Depression   . Anxiety   . Anemia   . Renal insufficiency   . Ulcerative colitis   . RLS (restless legs syndrome)   . Pulmonary nodule, right     right upper lobe  . Anal stenosis   . Esophagitis   . De  Quervain's tenosynovitis   . Hyperlipidemia   . Fibroid   . History of cervical dysplasia   . Asthma   . Hearing loss   . Visual disturbance   . Abdominal pain   . Rectal bleeding   . Blood in stool   . Rectal pain   . Difficulty urinating   . Blood in urine   . Fainting     due to dehydration  . Weakness generalized   . Fatigue   . Easy bruising   . Hemorrhoids, internal   . Hemorrhoids, external   . Ovarian cyst   . Herpes, genital     vaginal treated 07/05/12 and pt states is resolved    Past Surgical History  Procedure Laterality Date  . Anal dilation    . 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 Ileostomy closure    . Cholecystectomy    . Tubal ligation    . Hemorrhoid surgery    . Fatty tumor removed from back    . Total abdominal hysterectomy  1998    TAH/LSO  . Cervical biopsy  w/ loop electrode excision    . Upper gastrointestinal endoscopy  Current Outpatient Prescriptions  Medication Sig Dispense Refill  . Adalimumab (HUMIRA PEN-CROHNS STARTER) 40 MG/0.8ML PNKT Inject 40 mg into the skin as directed. Inject 160 mg (4 pens) day 0, Inject 80 mg (2 pens) day 14. Then, use maintenance dose thereafter. 6 each 0  . albuterol (PROAIR HFA) 108 (90 BASE) MCG/ACT inhaler Inhale 2 puffs into the lungs every 6 (six) hours as needed for wheezing. 1 Inhaler 3  . buPROPion (WELLBUTRIN XL) 150 MG 24 hr tablet TAKE 1 TABLET (150 MG TOTAL) BY MOUTH DAILY. 90 tablet 1  . CYMBALTA 60 MG capsule TAKE 2 TABLETS BY MOUTH ONCE  DAILY 180 capsule 1  . febuxostat (ULORIC) 40 MG tablet Take 2 tablets (80 mg total) by mouth daily. 60 tablet 2  . metoprolol succinate (TOPROL-XL) 100 MG 24 hr tablet TAKE 1/2 TABLET EVERY DAY 45 tablet 3  . Multiple Vitamins-Minerals (MULTIVITAL) CHEW Chew by mouth.    . pantoprazole (PROTONIX) 40 MG tablet TAKE 1 TABLET TWICE A DAY 180 tablet 3  . temazepam (RESTORIL) 30 MG capsule TAKE 1 CAPSULE  AT BEDTIME 30 capsule 1  . dicyclomine (BENTYL) 20 MG tablet TAKE 1 TABLET (20 MG TOTAL) BY MOUTH 3 (THREE) TIMES DAILY BEFORE MEALS. (Patient not taking: Reported on 11/22/2014) 30 tablet 2  . diphenoxylate-atropine (LOMOTIL) 2.5-0.025 MG per tablet Take 1 tablet by mouth 3 (three) times daily as needed. (Patient not taking: Reported on 11/22/2014) 90 tablet 0  . Fluticasone-Salmeterol (ADVAIR) 250-50 MCG/DOSE AEPB Inhale 1 puff into the lungs 2 (two) times daily as needed (for wheezing or shortness of breath).     Marland Kitchen HYDROcodone-acetaminophen (NORCO/VICODIN) 5-325 MG per tablet Take 2 tablets by mouth every 4 (four) hours as needed for moderate pain or severe pain. (Patient not taking: Reported on 11/22/2014) 60 tablet 0  . mesalamine (CANASA) 1000 MG suppository Place 1 suppository (1,000 mg total) rectally at bedtime. (Patient not taking: Reported on 11/22/2014) 30 suppository 3  . Simethicone (GAS-X PO) Take 1 tablet by mouth 2 (two) times daily.    Marland Kitchen tiZANidine (ZANAFLEX) 4 MG tablet Take 4 mg by mouth every 6 (six) hours as needed for muscle spasms.     No current facility-administered medications for this visit.    Allergies:   Morphine and Sulfonamide derivatives    Social History:  The patient  reports that she quit smoking about 35 years ago. Her smoking use included Cigarettes. She has never used smokeless tobacco. She reports that she does not drink alcohol or use illicit drugs.   Family History:  The patient's family history includes Cancer in her maternal uncle and sister; Diabetes in her sister; Heart disease in her father and mother; Hypertension in her father and mother; Irritable bowel syndrome in an other family member; Ulcerative colitis in her daughter and father. There is no history of Colon cancer.    ROS:  Please see the history of present illness.    Review of Systems: Constitutional:  denies fever, chills, diaphoresis, appetite change and fatigue.  HEENT: denies  photophobia, eye pain, redness, hearing loss, ear pain, congestion, sore throat, rhinorrhea, sneezing, neck pain, neck stiffness and tinnitus.  Respiratory: denies SOB, DOE, cough, chest tightness, and wheezing.  Cardiovascular: denies chest pain, palpitations and leg swelling.  Gastrointestinal: denies nausea, vomiting, abdominal pain, diarrhea, constipation, blood in stool.  Genitourinary: denies dysuria, urgency, frequency, hematuria, flank pain and difficulty urinating.  Musculoskeletal: denies  myalgias, back pain, joint swelling, arthralgias and gait problem.  Skin: denies pallor, rash and wound.  Neurological: denies dizziness, seizures, syncope, weakness, light-headedness, numbness and headaches.   Hematological: denies adenopathy, easy bruising, personal or family bleeding history.  Psychiatric/ Behavioral: denies suicidal ideation, mood changes, confusion, nervousness, sleep disturbance and agitation.       All other systems are reviewed and negative.    PHYSICAL EXAM: VS:  BP 120/90 mmHg  Pulse 82  Ht 5' 3"  (1.6 m)  Wt 176 lb 6.4 oz (80.015 kg)  BMI 31.26 kg/m2 , BMI Body mass index is 31.26 kg/(m^2). GEN: Well nourished, well developed, in no acute distress HEENT: normal Neck: no JVD, carotid bruits, or masses Cardiac: RRR; no murmurs, rubs, or gallops,no edema  Respiratory:  clear to auscultation bilaterally, normal work of breathing GI: soft, nontender, nondistended, + BS MS: no deformity or atrophy Skin: warm and dry, no rash Neuro:  Strength and sensation are intact Psych: normal   EKG:  EKG is ordered today. The ekg ordered today demonstrates NSR at 82, normal ECG    Recent Labs: 10/17/2014: BUN 31*; Creatinine 2.55*; Hemoglobin 9.5*; Platelets 317; Potassium 4.1; Sodium 133*    Lipid Panel    Component Value Date/Time   CHOL 191 10/16/2007 1156   TRIG 142 10/16/2007 1156   TRIG 161* 09/19/2006 0940   HDL 38.1* 10/16/2007 1156   CHOLHDL 5.0 CALC  10/16/2007 1156   CHOLHDL 5.0 CALC 09/19/2006 0940   VLDL 28 10/16/2007 1156   LDLCALC 124* 10/16/2007 1156   LDLDIRECT 127.0 09/19/2006 0940      Wt Readings from Last 3 Encounters:  11/22/14 176 lb 6.4 oz (80.015 kg)  10/29/14 179 lb (81.194 kg)  10/22/14 181 lb 6.4 oz (82.283 kg)      Other studies Reviewed: Additional studies/ records that were reviewed today include: . Review of the above records demonstrates:    ASSESSMENT AND PLAN:  1. Hypertension - blood pressures well-controlled. Continue current dose of metoprolol.  2. Ulcerative colitis- s/p total colectomy. Has a J pouch leading to her rectum  3. Hyperlipidemia - her last lipid level was in 2009. We'll check her fasting lipids today. She may need to be started on a statin. I've encouraged her to work on a better diet, exercise, and weight loss program.  4. Chest pain-stress Myoview study in 2008 was normal   Current medicines are reviewed at length with the patient today.  The patient does not have concerns regarding medicines.  The following changes have been made:  no change  Labs/ tests ordered today include:  No orders of the defined types were placed in this encounter.     Disposition:   FU with me in 1 year     Signed, Nahser, Wonda Cheng, MD  11/22/2014 9:49 AM    [address], [place],   [address] Phone: [phone]; Fax: [phone]

## 2014-11-22 NOTE — Patient Instructions (Addendum)
Your physician recommends that you have lab work:  TODAY - fasting cholesterol, liver, bmet  Your physician recommends that you continue on your current medications as directed. Please refer to the Current Medication list given to you today.  Your physician wants you to follow-up in: 1 year with Dr. Acie Fredrickson.  You will receive a reminder letter in the mail two months in advance. If you don't receive a letter, please call our office to schedule the follow-up appointment.

## 2014-11-25 ENCOUNTER — Encounter: Payer: Self-pay | Admitting: Gynecology

## 2014-11-28 ENCOUNTER — Ambulatory Visit (INDEPENDENT_AMBULATORY_CARE_PROVIDER_SITE_OTHER): Payer: Medicare Other | Admitting: Family Medicine

## 2014-11-28 ENCOUNTER — Encounter: Payer: Self-pay | Admitting: Family Medicine

## 2014-11-28 ENCOUNTER — Other Ambulatory Visit: Payer: Self-pay

## 2014-11-28 VITALS — BP 112/80 | HR 103 | Temp 98.6°F | Wt 175.8 lb

## 2014-11-28 DIAGNOSIS — G4700 Insomnia, unspecified: Secondary | ICD-10-CM | POA: Diagnosis not present

## 2014-11-28 DIAGNOSIS — M10071 Idiopathic gout, right ankle and foot: Secondary | ICD-10-CM | POA: Diagnosis not present

## 2014-11-28 LAB — BASIC METABOLIC PANEL
BUN: 40 mg/dL — ABNORMAL HIGH (ref 6–23)
CO2: 18 mEq/L — ABNORMAL LOW (ref 19–32)
Calcium: 8.3 mg/dL — ABNORMAL LOW (ref 8.4–10.5)
Chloride: 107 mEq/L (ref 96–112)
Creatinine, Ser: 2.7 mg/dL — ABNORMAL HIGH (ref 0.40–1.20)
GFR: 19.04 mL/min — ABNORMAL LOW (ref >60.00)
Glucose, Bld: 89 mg/dL (ref 70–99)
Potassium: 3.9 mEq/L (ref 3.5–5.1)
Sodium: 133 mEq/L — ABNORMAL LOW (ref 135–145)

## 2014-11-28 LAB — URIC ACID: Uric Acid, Serum: 4.6 mg/dL (ref 2.4–7.0)

## 2014-11-28 MED ORDER — FEBUXOSTAT 80 MG PO TABS: ORAL_TABLET | ORAL | Status: DC

## 2014-11-28 MED ORDER — TEMAZEPAM 30 MG PO CAPS
30.0000 mg | ORAL_CAPSULE | Freq: Every day | ORAL | Status: DC
Start: 2014-11-28 — End: 2015-01-01

## 2014-11-28 MED ORDER — FEBUXOSTAT 40 MG PO TABS: 40.0000 mg | ORAL_TABLET | Freq: Every day | ORAL | Status: DC

## 2014-11-28 NOTE — Progress Notes (Signed)
Subjective:    Patient ID: [name], female    DOB: 01/04/1956, 59 y.o.   MRN: [id number]  HPI  Patient here for f/u gout.  R ankle/ foot is much better.    Past Medical History  Diagnosis Date  . History of small bowel obstruction   . Obesity   . Hyperparathyroidism   . Hypokalemia   . Hypertension   . Gastric polyp   . GERD (gastroesophageal reflux disease)   . Depression   . Anxiety   . Anemia   . Renal insufficiency   . Ulcerative colitis   . RLS (restless legs syndrome)   . Pulmonary nodule, [name]     [name] upper lobe  . Anal stenosis   . Esophagitis   . De Quervain's tenosynovitis   . Hyperlipidemia   . Fibroid   . History of cervical dysplasia   . Asthma   . Hearing loss   . Visual disturbance   . Abdominal pain   . Rectal bleeding   . Blood in stool   . Rectal pain   . Difficulty urinating   . Blood in urine   . Fainting     due to dehydration  . Weakness generalized   . Fatigue   . Easy bruising   . Hemorrhoids, internal   . Hemorrhoids, external   . Ovarian cyst   . Herpes, genital     vaginal treated 07/05/12 and pt states is resolved    Review of Systems  Constitutional: Negative for activity change, appetite change, fatigue and unexpected weight change.  Respiratory: Negative for cough and shortness of breath.   Cardiovascular: Negative for chest pain and palpitations.  Psychiatric/Behavioral: Negative for behavioral problems and dysphoric mood. The patient is not nervous/anxious.     Current Outpatient Prescriptions on File Prior to Visit  Medication Sig Dispense Refill  . Adalimumab (HUMIRA PEN-CROHNS STARTER) 40 MG/0.8ML PNKT Inject 40 mg into the skin as directed. Inject 160 mg (4 pens) day 0, Inject 80 mg (2 pens) day 14. Then, use maintenance dose thereafter. 6 each 0  . albuterol (PROAIR HFA) 108 (90 BASE) MCG/ACT inhaler Inhale 2 puffs into the lungs every 6 (six) hours as needed for wheezing. 1 Inhaler 3  . CYMBALTA 60 MG  capsule TAKE 2 TABLETS BY MOUTH ONCE  DAILY 180 capsule 1  . Fluticasone-Salmeterol (ADVAIR) 250-50 MCG/DOSE AEPB Inhale 1 puff into the lungs 2 (two) times daily as needed (for wheezing or shortness of breath).     Marland Kitchen HYDROcodone-acetaminophen (NORCO/VICODIN) 5-325 MG per tablet Take 2 tablets by mouth every 4 (four) hours as needed for moderate pain or severe pain. 60 tablet 0  . mesalamine (CANASA) 1000 MG suppository Place 1 suppository (1,000 mg total) rectally at bedtime. 30 suppository 3  . metoprolol succinate (TOPROL-XL) 100 MG 24 hr tablet TAKE 1/2 TABLET EVERY DAY 45 tablet 3  . Multiple Vitamins-Minerals (MULTIVITAL) CHEW Chew by mouth.    . pantoprazole (PROTONIX) 40 MG tablet TAKE 1 TABLET TWICE A DAY 180 tablet 3  . tiZANidine (ZANAFLEX) 4 MG tablet Take 4 mg by mouth every 6 (six) hours as needed for muscle spasms.     No current facility-administered medications on file prior to visit.       Objective:    Physical Exam  Constitutional: She is oriented to person, place, and time. She appears well-developed and well-nourished. No distress.  HENT:  [name] Ear: External ear normal.  Left  Ear: External ear normal.  Nose: Nose normal.  Mouth/Throat: Oropharynx is clear and moist.  Eyes: EOM are normal. Pupils are equal, round, and reactive to light.  Neck: Normal range of motion. Neck supple.  Cardiovascular: Normal rate, regular rhythm and normal heart sounds.   No murmur heard. Pulmonary/Chest: Effort normal and breath sounds normal. No respiratory distress. She has no wheezes. She has no rales. She exhibits no tenderness.  Musculoskeletal: She exhibits no edema or tenderness.  Neurological: She is alert and oriented to person, place, and time.  Psychiatric: She has a normal mood and affect. Her behavior is normal. Judgment and thought content normal.    BP 112/80 mmHg  Pulse 103  Temp(Src) 98.6 F (37 C) (Oral)  Wt 175 lb 12.8 oz (79.742 kg)  SpO2 97% Wt Readings  from Last 3 Encounters:  11/28/14 175 lb 12.8 oz (79.742 kg)  11/22/14 176 lb 6.4 oz (80.015 kg)  10/29/14 179 lb (81.194 kg)     Lab Results  Component Value Date   WBC 12.1* 10/17/2014   HGB 9.5* 10/17/2014   HCT 29.6* 10/17/2014   PLT 317 10/17/2014   GLUCOSE 99 11/22/2014   CHOL 199 11/22/2014   TRIG 156.0* 11/22/2014   HDL 45.80 11/22/2014   LDLDIRECT 127.0 09/19/2006   LDLCALC 122* 11/22/2014   ALT 11 11/22/2014   AST 17 11/22/2014   NA 134* 11/22/2014   K 4.0 11/22/2014   CL 108 11/22/2014   CREATININE 2.60* 11/22/2014   BUN 43* 11/22/2014   CO2 20 11/22/2014   TSH 2.20 02/04/2009       Assessment & Plan:   Problem List Items Addressed This Visit    None    Visit Diagnoses    Insomnia    -  Primary    Relevant Medications    temazepam (RESTORIL) capsule    Acute idiopathic gout of [name] foot        Relevant Medications    Febuxostat (ULORIC) 80 MG TABS    Other Relevant Orders    Basic metabolic panel    Uric acid       I have discontinued Ms. Xue's diphenoxylate-atropine, buPROPion, dicyclomine, Simethicone (GAS-X PO), and febuxostat. I have also changed her temazepam. Additionally, I am having her start on Febuxostat. Lastly, I am having her maintain her MULTIVITAL, albuterol, mesalamine, pantoprazole, Adalimumab, Fluticasone-Salmeterol, CYMBALTA, tiZANidine, HYDROcodone-acetaminophen, metoprolol succinate, and calcitRIOL.  Meds ordered this encounter  Medications  . calcitRIOL (ROCALTROL) 0.25 MCG capsule    Sig: Take 0.25 mcg by mouth every other day.  . temazepam (RESTORIL) 30 MG capsule    Sig: Take 1 capsule (30 mg total) by mouth at bedtime.    Dispense:  30 capsule    Refill:  1    Not to exceed 4 additional fills before 02/11/2015  . Febuxostat (ULORIC) 80 MG TABS    Sig: 1 po qd    Dispense:  30 tablet    Refill:  [place], DO

## 2014-11-28 NOTE — Progress Notes (Signed)
Pre visit review using our clinic review tool, if applicable. No additional management support is needed unless otherwise documented below in the visit note. 

## 2014-11-28 NOTE — Patient Instructions (Signed)

## 2014-12-06 ENCOUNTER — Other Ambulatory Visit: Payer: Self-pay | Admitting: Internal Medicine

## 2014-12-06 NOTE — Telephone Encounter (Signed)
The patient would like to get a Rx for ciprofloxacin (CIPRO), 250 mg. They take 1 capsule, po, bid. Can the patient get a refill for this Rx? Please advise.

## 2014-12-12 ENCOUNTER — Other Ambulatory Visit: Payer: Self-pay

## 2014-12-12 DIAGNOSIS — F329 Major depressive disorder, single episode, unspecified: Secondary | ICD-10-CM

## 2014-12-12 DIAGNOSIS — F32A Depression, unspecified: Secondary | ICD-10-CM

## 2014-12-12 MED ORDER — CYMBALTA 60 MG PO CPEP: ORAL_CAPSULE | ORAL | Status: DC

## 2014-12-20 ENCOUNTER — Telehealth: Payer: Self-pay | Admitting: Family Medicine

## 2014-12-20 DIAGNOSIS — M10071 Idiopathic gout, right ankle and foot: Secondary | ICD-10-CM

## 2014-12-20 MED ORDER — HYDROCODONE-ACETAMINOPHEN 5-325 MG PO TABS: 2.0000 | ORAL_TABLET | ORAL | Status: DC | PRN

## 2014-12-20 NOTE — Telephone Encounter (Signed)
Caller name: Audrea Muscat Relation to pt: self Call back number: [phone] Pharmacy:  Reason for call:   Requesting hydrocodone rx. Patient states that she has been taking more since having a gout flare up  Patient also states that she has gout in both feets but is not as bad as before. She is trying to change her eating habits. FYI

## 2014-12-20 NOTE — Telephone Encounter (Signed)
Last seen 11/28/14 and filled 10/22/14 #60 UDS 09/19/13 low risk   Please advise     KP

## 2014-12-20 NOTE — Telephone Encounter (Signed)
Left message for patient to return my call.

## 2014-12-20 NOTE — Telephone Encounter (Signed)
Mediation refilled.  At front desk for pickup.  Follow-up with [place] regarding gout prophylaxis.

## 2014-12-25 ENCOUNTER — Telehealth: Payer: Self-pay | Admitting: Family Medicine

## 2014-12-25 NOTE — Telephone Encounter (Signed)
error:[id number] ° °

## 2014-12-26 ENCOUNTER — Encounter: Payer: Self-pay | Admitting: Family Medicine

## 2014-12-26 ENCOUNTER — Ambulatory Visit (INDEPENDENT_AMBULATORY_CARE_PROVIDER_SITE_OTHER): Payer: Medicare Other | Admitting: Family Medicine

## 2014-12-26 VITALS — BP 122/64 | HR 91 | Temp 98.1°F | Wt 173.8 lb

## 2014-12-26 DIAGNOSIS — M10071 Idiopathic gout, right ankle and foot: Secondary | ICD-10-CM

## 2014-12-26 LAB — URIC ACID: Uric Acid, Serum: 5.1 mg/dL (ref 2.4–7.0)

## 2014-12-26 LAB — BASIC METABOLIC PANEL
BUN: 39 mg/dL — ABNORMAL HIGH (ref 6–23)
CO2: 18 mEq/L — ABNORMAL LOW (ref 19–32)
Calcium: 8.8 mg/dL (ref 8.4–10.5)
Chloride: 104 mEq/L (ref 96–112)
Creatinine, Ser: 3.31 mg/dL — ABNORMAL HIGH (ref 0.40–1.20)
GFR: 15.05 mL/min — ABNORMAL LOW (ref >60.00)
Glucose, Bld: 94 mg/dL (ref 70–99)
Potassium: 4 mEq/L (ref 3.5–5.1)
Sodium: 132 mEq/L — ABNORMAL LOW (ref 135–145)

## 2014-12-26 MED ORDER — HYDROCODONE-ACETAMINOPHEN 5-325 MG PO TABS: 2.0000 | ORAL_TABLET | ORAL | Status: DC | PRN

## 2014-12-26 NOTE — Progress Notes (Signed)
Pre visit review using our clinic review tool, if applicable. No additional management support is needed unless otherwise documented below in the visit note. 

## 2014-12-26 NOTE — Patient Instructions (Signed)

## 2014-12-26 NOTE — Progress Notes (Signed)
Patient ID: [name], female    DOB: June 24, 1956  Age: 59 y.o. MRN: [id number]    Subjective:  Subjective HPI [name] presents for recurrent pain and swelling in feet although it is better today.  She feels as though it is worse when it is cold outside.    Review of Systems  Constitutional: Negative for activity change, appetite change, fatigue and unexpected weight change.  Respiratory: Negative for cough and shortness of breath.   Cardiovascular: Negative for chest pain and palpitations.  Musculoskeletal: Positive for joint swelling and arthralgias.  Psychiatric/Behavioral: Negative for behavioral problems and dysphoric mood. The patient is not nervous/anxious.     History Past Medical History  Diagnosis Date  . History of small bowel obstruction   . Obesity   . Hyperparathyroidism   . Hypokalemia   . Hypertension   . Gastric polyp   . GERD (gastroesophageal reflux disease)   . Depression   . Anxiety   . Anemia   . Renal insufficiency   . Ulcerative colitis   . RLS (restless legs syndrome)   . Pulmonary nodule, right     right upper lobe  . Anal stenosis   . Esophagitis   . De Quervain's tenosynovitis   . Hyperlipidemia   . Fibroid   . History of cervical dysplasia   . Asthma   . Hearing loss   . Visual disturbance   . Abdominal pain   . Rectal bleeding   . Blood in stool   . Rectal pain   . Difficulty urinating   . Blood in urine   . Fainting     due to dehydration  . Weakness generalized   . Fatigue   . Easy bruising   . Hemorrhoids, internal   . Hemorrhoids, external   . Ovarian cyst   . Herpes, genital     vaginal treated 07/05/12 and pt states is resolved    She has past surgical history that includes Anal dilation; Restorative proctocolectomy; Ileostomy closure; Cholecystectomy; Tubal ligation; Hemorrhoid surgery; fatty tumor removed from back; Total abdominal hysterectomy (1998); Cervical biopsy w/ loop electrode excision; and Upper  gastrointestinal endoscopy.   Her family history includes Cancer in her maternal uncle and sister; Diabetes in her sister; Heart disease in her father and mother; Hypertension in her father and mother; Irritable bowel syndrome in an other family member; Ulcerative colitis in her daughter and father. There is no history of Colon cancer.She reports that she quit smoking about 35 years ago. Her smoking use included Cigarettes. She has never used smokeless tobacco. She reports that she does not drink alcohol or use illicit drugs.  Current Outpatient Prescriptions on File Prior to Visit  Medication Sig Dispense Refill  . Adalimumab (HUMIRA PEN-CROHNS STARTER) 40 MG/0.8ML PNKT Inject 40 mg into the skin as directed. Inject 160 mg (4 pens) day 0, Inject 80 mg (2 pens) day 14. Then, use maintenance dose thereafter. 6 each 0  . albuterol (PROAIR HFA) 108 (90 BASE) MCG/ACT inhaler Inhale 2 puffs into the lungs every 6 (six) hours as needed for wheezing. 1 Inhaler 3  . calcitRIOL (ROCALTROL) 0.25 MCG capsule Take 0.25 mcg by mouth every other day. Mon, Wed, Fri    . ciprofloxacin (CIPRO) 250 MG tablet TAKE 1 TABLET BY MOUTH TWICE A DAY (Patient taking differently: TAKE 1 TABLET BY MOUTH TWICE A DAY AS NEEDED) 20 tablet 1  . CYMBALTA 60 MG capsule TAKE 2 TABLETS BY MOUTH  ONCE  DAILY 180 capsule 3  . febuxostat (ULORIC) 40 MG tablet Take 1 tablet (40 mg total) by mouth daily. 30 tablet 11  . Fluticasone-Salmeterol (ADVAIR) 250-50 MCG/DOSE AEPB Inhale 1 puff into the lungs 2 (two) times daily as needed (for wheezing or shortness of breath).     . mesalamine (CANASA) 1000 MG suppository Place 1 suppository (1,000 mg total) rectally at bedtime. 30 suppository 3  . metoprolol succinate (TOPROL-XL) 100 MG 24 hr tablet TAKE 1/2 TABLET EVERY DAY 45 tablet 3  . Multiple Vitamins-Minerals (MULTIVITAL) CHEW Chew by mouth.    . pantoprazole (PROTONIX) 40 MG tablet TAKE 1 TABLET TWICE A DAY 180 tablet 3  . temazepam  (RESTORIL) 30 MG capsule Take 1 capsule (30 mg total) by mouth at bedtime. 30 capsule 1  . tiZANidine (ZANAFLEX) 4 MG tablet Take 4 mg by mouth every 6 (six) hours as needed for muscle spasms.     No current facility-administered medications on file prior to visit.     Objective:  Objective Physical Exam  Constitutional: She is oriented to person, place, and time. She appears well-developed and well-nourished.  HENT:  Head: Normocephalic and atraumatic.  Eyes: Conjunctivae and EOM are normal.  Neck: Normal range of motion. Neck supple. No JVD present. Carotid bruit is not present. No thyromegaly present.  Cardiovascular: Normal rate, regular rhythm and normal heart sounds.   No murmur heard. Pulmonary/Chest: Effort normal and breath sounds normal. No respiratory distress. She has no wheezes. She has no rales. She exhibits no tenderness.  Musculoskeletal: Normal range of motion. She exhibits no edema or tenderness.  Neurological: She is alert and oriented to person, place, and time.  Skin: Skin is warm and dry. No rash noted. No erythema.  Psychiatric: She has a normal mood and affect. Her behavior is normal. Judgment and thought content normal.   BP 122/64 mmHg  Pulse 91  Temp(Src) 98.1 F (36.7 C) (Oral)  Wt 173 lb 12.8 oz (78.835 kg)  SpO2 98% Wt Readings from Last 3 Encounters:  12/26/14 173 lb 12.8 oz (78.835 kg)  11/28/14 175 lb 12.8 oz (79.742 kg)  11/22/14 176 lb 6.4 oz (80.015 kg)     Lab Results  Component Value Date   WBC 12.1* 10/17/2014   HGB 9.5* 10/17/2014   HCT 29.6* 10/17/2014   PLT 317 10/17/2014   GLUCOSE 94 12/26/2014   CHOL 199 11/22/2014   TRIG 156.0* 11/22/2014   HDL 45.80 11/22/2014   LDLDIRECT 127.0 09/19/2006   LDLCALC 122* 11/22/2014   ALT 11 11/22/2014   AST 17 11/22/2014   NA 132* 12/26/2014   K 4.0 12/26/2014   CL 104 12/26/2014   CREATININE 3.31* 12/26/2014   BUN 39* 12/26/2014   CO2 18* 12/26/2014   TSH 2.20 02/04/2009    Dg  Chest 2 View  11/04/2014   CLINICAL DATA:  TB screening.  Hypertension.  EXAM: CHEST  2 VIEW  COMPARISON:  04/02/2013.  FINDINGS: Mediastinum and hilar structures normal. Lungs are clear. No pleural effusion or pneumothorax. Surgical clips right upper quadrant. Chest is stable from 04/02/2013.  IMPRESSION: No acute cardiopulmonary disease.   Electronically Signed   By: [place]   On: 11/04/2014 11:40     Assessment & Plan:  Plan I am having Ms. Pilant maintain her MULTIVITAL, albuterol, mesalamine, pantoprazole, Adalimumab, Fluticasone-Salmeterol, tiZANidine, metoprolol succinate, calcitRIOL, temazepam, febuxostat, ciprofloxacin, CYMBALTA, and HYDROcodone-acetaminophen.  Meds ordered this encounter  Medications  . HYDROcodone-acetaminophen (NORCO/VICODIN) 5-325  MG per tablet    Sig: Take 2 tablets by mouth every 4 (four) hours as needed for moderate pain or severe pain.    Dispense:  90 tablet    Refill:  0    Problem List Items Addressed This Visit    Gout of foot - Primary   Relevant Medications   HYDROcodone-acetaminophen (NORCO/VICODIN) 5-325 MG per tablet   Other Relevant Orders   Basic metabolic panel (Completed)   Uric acid (Completed)      Follow-up: Return in about 6 months (around 06/27/2015), or if symptoms worsen or fail to improve.  Garnet Koyanagi, DO

## 2015-01-01 ENCOUNTER — Other Ambulatory Visit: Payer: Self-pay | Admitting: Internal Medicine

## 2015-01-01 NOTE — Telephone Encounter (Signed)
Sent Rx for temazepam (RESTORIL), 30 mg, #30 with one refill to [facility] off [address].

## 2015-01-06 ENCOUNTER — Other Ambulatory Visit: Payer: Self-pay | Admitting: Nephrology

## 2015-01-06 DIAGNOSIS — R7989 Other specified abnormal findings of blood chemistry: Secondary | ICD-10-CM

## 2015-01-07 ENCOUNTER — Ambulatory Visit
Admission: RE | Admit: 2015-01-07 | Discharge: 2015-01-07 | Disposition: A | Discharge status: Home / Self Care | Payer: Medicare Other | Source: Ambulatory Visit | Attending: Nephrology | Admitting: Nephrology

## 2015-01-07 DIAGNOSIS — N189 Chronic kidney disease, unspecified: Secondary | ICD-10-CM | POA: Diagnosis not present

## 2015-01-07 DIAGNOSIS — N182 Chronic kidney disease, stage 2 (mild): Secondary | ICD-10-CM | POA: Diagnosis not present

## 2015-01-07 DIAGNOSIS — M179 Osteoarthritis of knee, unspecified: Secondary | ICD-10-CM | POA: Diagnosis not present

## 2015-01-07 DIAGNOSIS — M109 Gout, unspecified: Secondary | ICD-10-CM | POA: Diagnosis not present

## 2015-01-07 DIAGNOSIS — D509 Iron deficiency anemia, unspecified: Secondary | ICD-10-CM | POA: Diagnosis not present

## 2015-01-07 DIAGNOSIS — R7989 Other specified abnormal findings of blood chemistry: Secondary | ICD-10-CM

## 2015-01-07 IMAGING — US US RENAL
1 series · 14 of 25 positions shown · non-contrast
Comparison: CT abdomen and pelvis [DATE]

CLINICAL DATA: Renal insufficiency

EXAM:
RENAL / URINARY TRACT ULTRASOUND COMPLETE

[Series 1: us renal · 0.26mm/px · 14 of 28 slices shown]
[im 1/28]
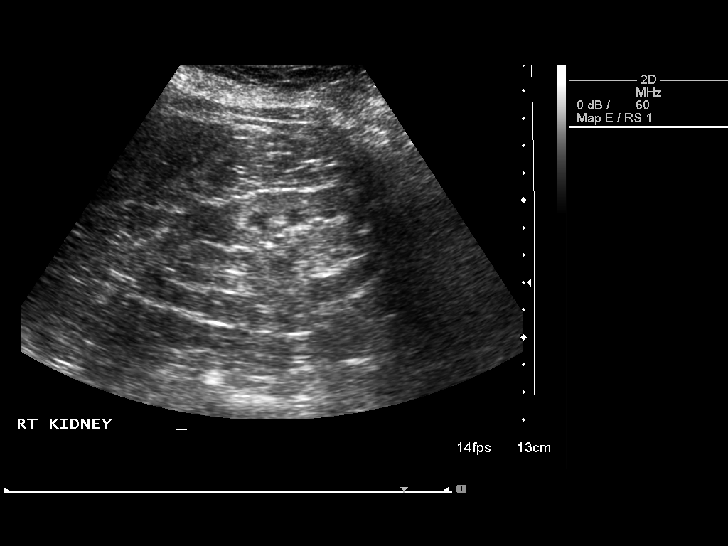
[im 3/28]
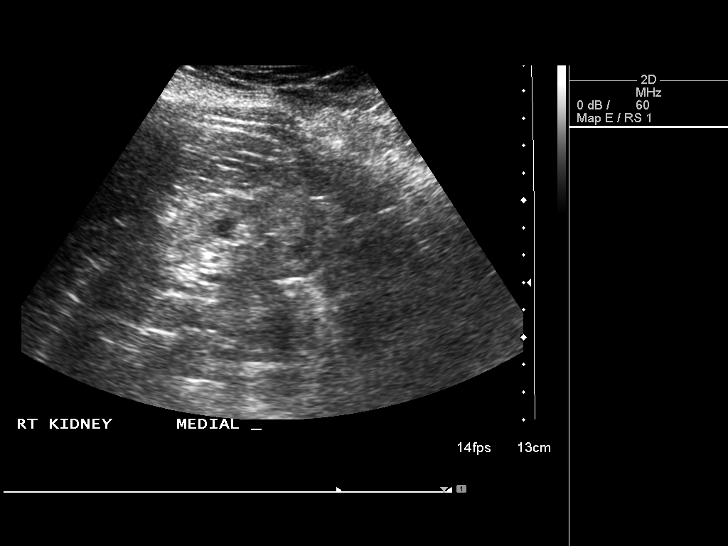
[im 5/28]
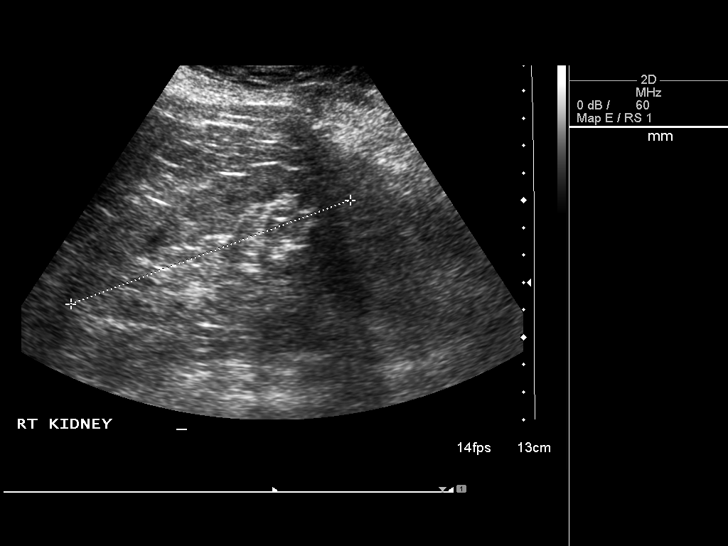
[im 7/28]
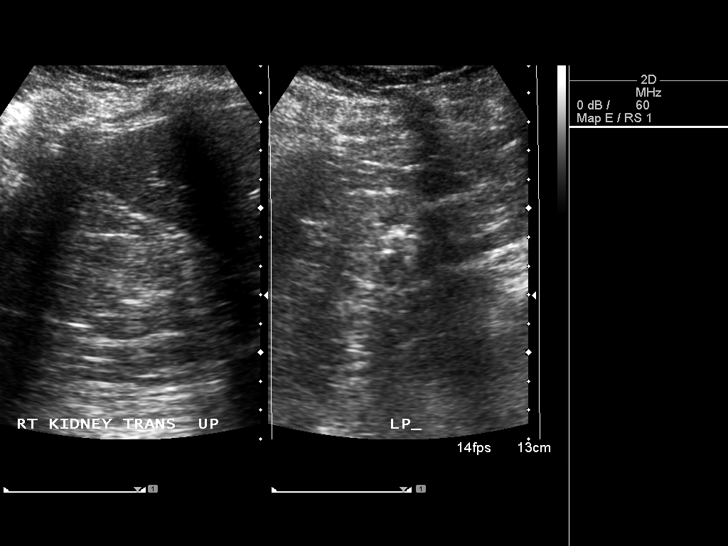
[im 10/28]
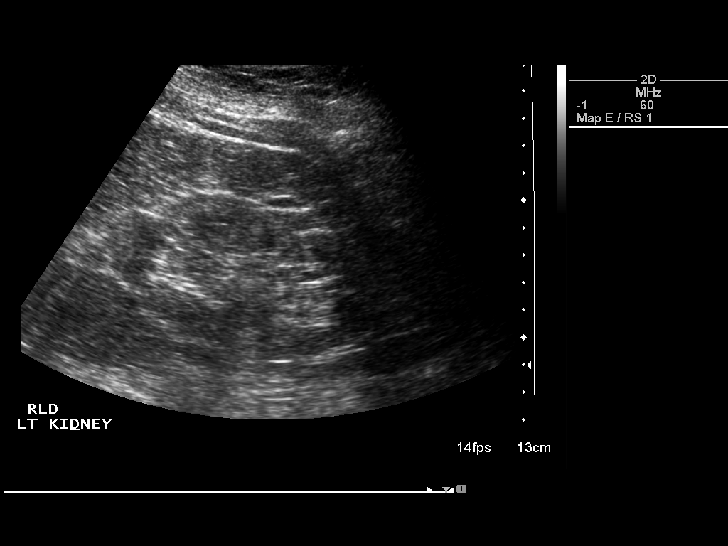
[im 11/28]
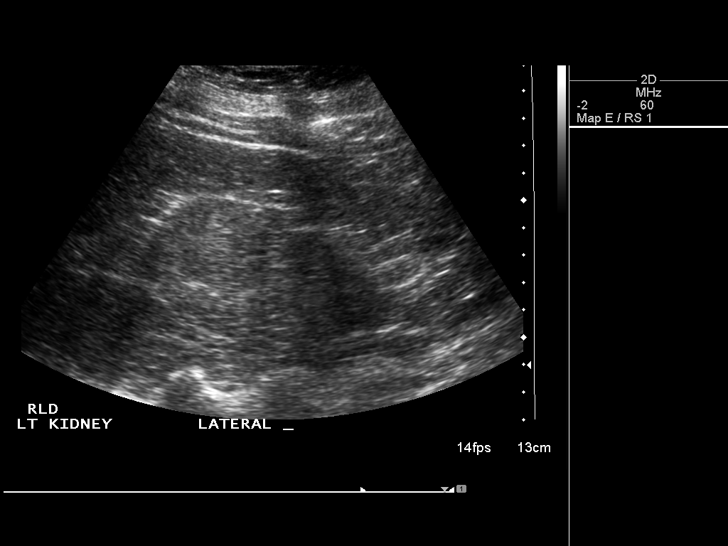
[im 13/28]
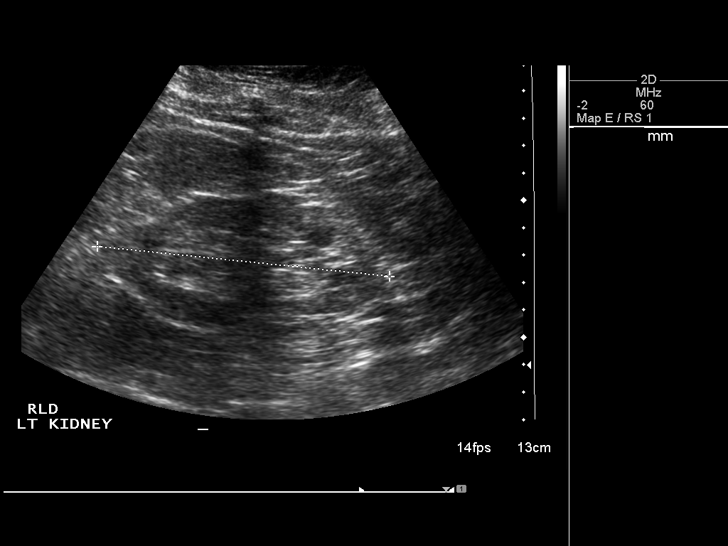
[im 15/28]
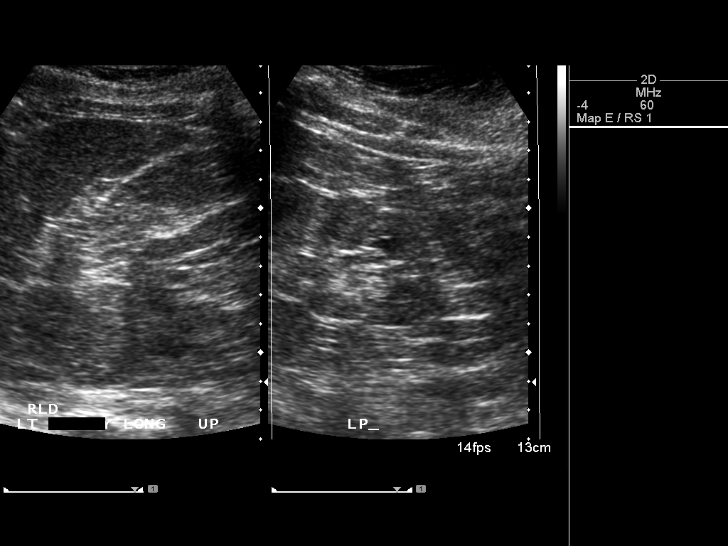
[im 17/28]
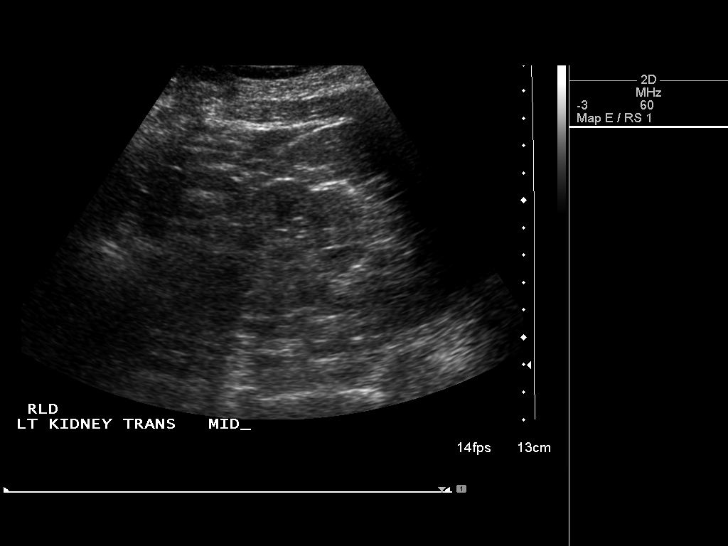
[im 19/28]
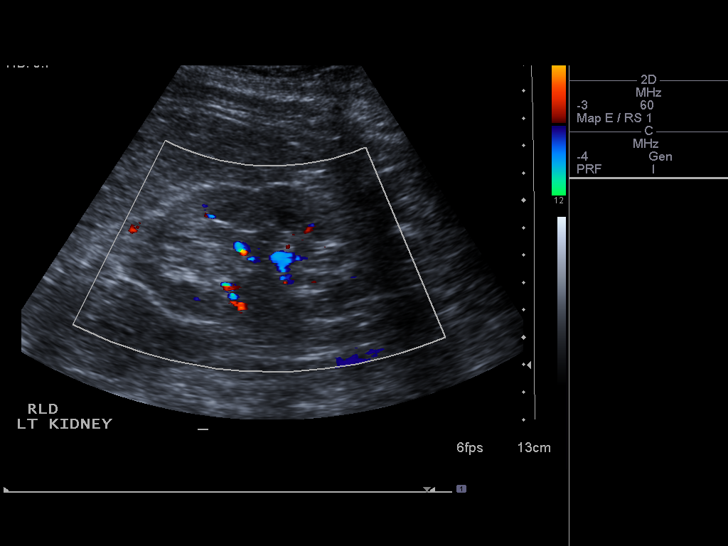
[im 21/28]
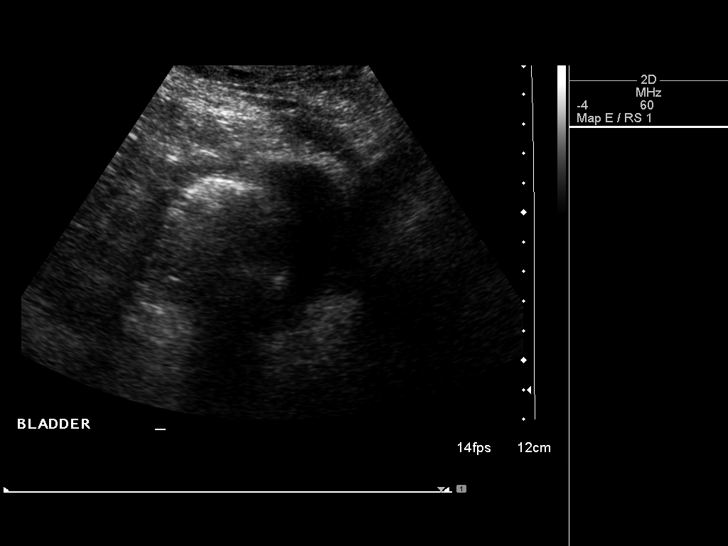
[im 23/28]
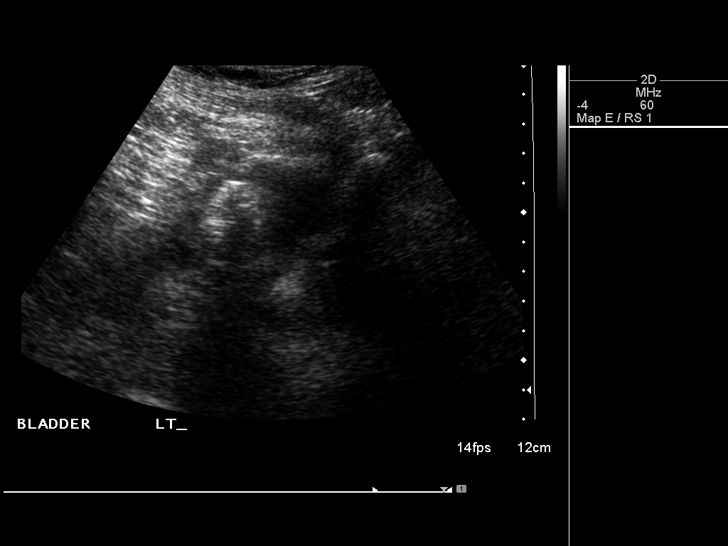
[im 25/28]
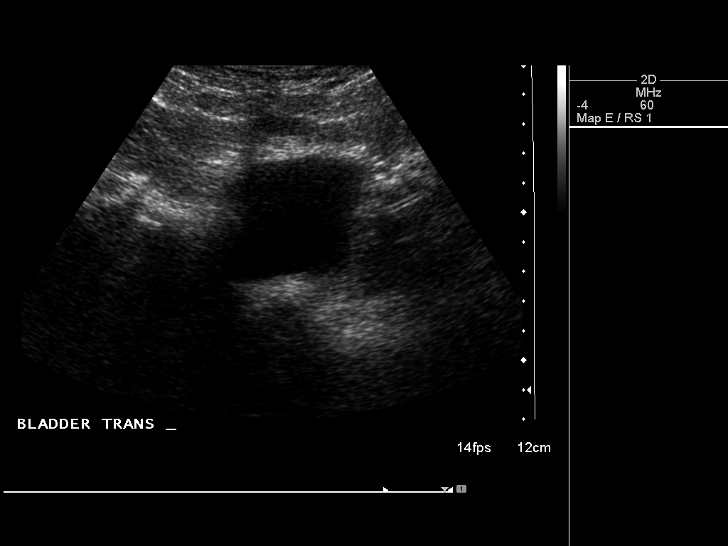
[im 28/28]
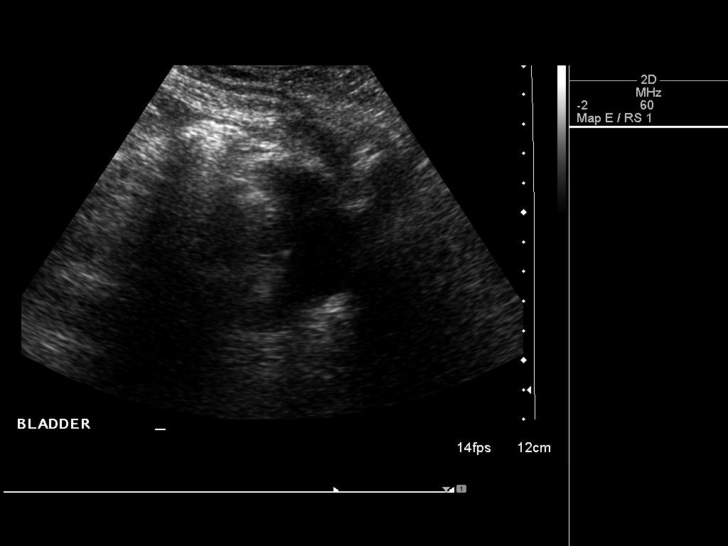

[14 of 25 positions shown; findings below may reference images not displayed]

FINDINGS: Right Kidney:

Length: 10.9 cm. Echogenicity is increased. Renal cortical thickness
is within normal limits. No mass, perinephric fluid, or
hydronephrosis visualized. No sonographically demonstrable calculus
or ureterectasis.

Left Kidney:

Length: 10.7 cm. Echogenicity is increased. Renal cortical thickness
is within normal limits. No mass, perinephric fluid, or
hydronephrosis visualized. No sonographically demonstrable calculus
or ureterectasis.

Bladder:

Appears normal for degree of bladder distention.
IMPRESSION: Renal echogenicity is increased bilaterally, a finding that is
associated with medical renal disease. No obstructing focus
identified on either side.

## 2015-01-08 DIAGNOSIS — N182 Chronic kidney disease, stage 2 (mild): Secondary | ICD-10-CM | POA: Diagnosis not present

## 2015-01-08 DIAGNOSIS — N179 Acute kidney failure, unspecified: Secondary | ICD-10-CM | POA: Diagnosis not present

## 2015-01-08 DIAGNOSIS — D509 Iron deficiency anemia, unspecified: Secondary | ICD-10-CM | POA: Diagnosis not present

## 2015-01-08 DIAGNOSIS — M109 Gout, unspecified: Secondary | ICD-10-CM | POA: Diagnosis not present

## 2015-01-16 ENCOUNTER — Telehealth: Payer: Self-pay | Admitting: Family Medicine

## 2015-01-16 DIAGNOSIS — M109 Gout, unspecified: Secondary | ICD-10-CM

## 2015-01-16 NOTE — Telephone Encounter (Signed)
Ref placed.      KP 

## 2015-01-16 NOTE — Telephone Encounter (Signed)
Caller name: Audrea Muscat Relation to pt: self Call back number: [phone]  Pharmacy:  Reason for call:   Patient states that she is still having problems with gout. Foot is red,swollen, and painful. Would like to be referred to someone for this.

## 2015-01-16 NOTE — Telephone Encounter (Signed)
Please advise      KP 

## 2015-01-16 NOTE — Telephone Encounter (Signed)
rhematologist referral

## 2015-01-27 ENCOUNTER — Encounter: Payer: Self-pay | Admitting: Family Medicine

## 2015-01-28 ENCOUNTER — Telehealth: Payer: Self-pay | Admitting: Internal Medicine

## 2015-01-28 MED ORDER — RANITIDINE HCL 150 MG PO TABS: ORAL_TABLET | ORAL | Status: DC

## 2015-01-28 NOTE — Telephone Encounter (Signed)
Ranitidine 150 mg, #30 1 po qd, 6 refills.

## 2015-01-28 NOTE — Telephone Encounter (Signed)
Spoke with patient and she states her kidneys are getting worse. States her kidney doctor stopped her Protonix. She is asking what she can take for reflux that does not hurt the kidneys. Please, advise.

## 2015-01-28 NOTE — Telephone Encounter (Signed)
Patient given recommendations. 

## 2015-01-29 ENCOUNTER — Other Ambulatory Visit: Payer: Self-pay | Admitting: Internal Medicine

## 2015-02-07 ENCOUNTER — Telehealth: Payer: Self-pay | Admitting: Internal Medicine

## 2015-02-07 MED ORDER — BUDESONIDE 9 MG PO TB24: ORAL_TABLET | ORAL | Status: DC

## 2015-02-07 NOTE — Telephone Encounter (Signed)
Left a message for patient to call back. 

## 2015-02-07 NOTE — Telephone Encounter (Signed)
Patient given recommendation. Rx sent to pharmacy. Scheduled f/u on 03/04/15 at 2:00 PM.

## 2015-02-07 NOTE — Telephone Encounter (Signed)
Please try Uceris 9 mg daily #30,  1 refill in addition to everything else

## 2015-02-07 NOTE — Telephone Encounter (Signed)
Spoke with patient and she is asking to be referred to [facility] for her stool leakage problems. She states since Dr. Olevia Perches is going to retire, she thought it might help her. She will come for OV with Dr. Olevia Perches wants her too. Please, advise.

## 2015-02-10 ENCOUNTER — Telehealth: Payer: Self-pay | Admitting: Internal Medicine

## 2015-02-10 DIAGNOSIS — M109 Gout, unspecified: Secondary | ICD-10-CM | POA: Diagnosis not present

## 2015-02-10 DIAGNOSIS — K5190 Ulcerative colitis, unspecified, without complications: Secondary | ICD-10-CM | POA: Diagnosis not present

## 2015-02-10 DIAGNOSIS — M25472 Effusion, left ankle: Secondary | ICD-10-CM | POA: Diagnosis not present

## 2015-02-10 DIAGNOSIS — N189 Chronic kidney disease, unspecified: Secondary | ICD-10-CM | POA: Diagnosis not present

## 2015-02-10 DIAGNOSIS — M25471 Effusion, right ankle: Secondary | ICD-10-CM | POA: Diagnosis not present

## 2015-02-12 NOTE — Telephone Encounter (Signed)
Patient called in on 02/10/15 requesting a Prior Authorization for their Uceris. Called the patient at [phone] (home #) and told the patient that I was sorry no one had called them back. I was out of the office a couple of days. I told the patient that I would process their Prior Authorization today.

## 2015-02-13 ENCOUNTER — Telehealth: Payer: Self-pay | Admitting: *Deleted

## 2015-02-13 MED ORDER — BUDESONIDE 9 MG PO TB24: ORAL_TABLET | ORAL | Status: DC

## 2015-02-13 NOTE — Telephone Encounter (Signed)
Called the patient at [phone] (Home#) to let them know that I could give them three full boxes of Uceris samples (Total of 36 tablets total), Lot [id number], Expiration 03/2018. Patient's insurance denied prior authorization approval. Patient will pick up tomorrow (02/14/15) from the front desk.

## 2015-03-04 ENCOUNTER — Other Ambulatory Visit (INDEPENDENT_AMBULATORY_CARE_PROVIDER_SITE_OTHER): Payer: Medicare Other

## 2015-03-04 ENCOUNTER — Ambulatory Visit (INDEPENDENT_AMBULATORY_CARE_PROVIDER_SITE_OTHER): Payer: Medicare Other | Admitting: Internal Medicine

## 2015-03-04 ENCOUNTER — Encounter: Payer: Self-pay | Admitting: Internal Medicine

## 2015-03-04 VITALS — BP 116/68 | HR 66 | Ht 63.25 in | Wt 179.0 lb

## 2015-03-04 DIAGNOSIS — K5190 Ulcerative colitis, unspecified, without complications: Secondary | ICD-10-CM

## 2015-03-04 LAB — CBC WITH DIFFERENTIAL/PLATELET
Basophils Absolute: 0 10*3/uL (ref 0.0–0.1)
Basophils Relative: 0.3 % (ref 0.0–3.0)
Eosinophils Absolute: 0.1 10*3/uL (ref 0.0–0.7)
Eosinophils Relative: 0.6 % (ref 0.0–5.0)
HCT: 30.8 % — ABNORMAL LOW (ref 36.0–46.0)
Hemoglobin: 10.1 g/dL — ABNORMAL LOW (ref 12.0–15.0)
Lymphocytes Relative: 31.2 % (ref 12.0–46.0)
Lymphs Abs: 3.1 10*3/uL (ref 0.7–4.0)
MCHC: 32.9 g/dL (ref 30.0–36.0)
MCV: 94 fl (ref 78.0–100.0)
Monocytes Absolute: 0.9 10*3/uL (ref 0.1–1.0)
Monocytes Relative: 9 % (ref 3.0–12.0)
Neutro Abs: 5.8 10*3/uL (ref 1.4–7.7)
Neutrophils Relative %: 58.9 % (ref 43.0–77.0)
Platelets: 455 10*3/uL — ABNORMAL HIGH (ref 150.0–400.0)
RBC: 3.28 Mil/uL — ABNORMAL LOW (ref 3.87–5.11)
RDW: 15.3 % (ref 11.5–15.5)
WBC: 9.8 10*3/uL (ref 4.0–10.5)

## 2015-03-04 LAB — SEDIMENTATION RATE: Sed Rate: 64 mm/hr — ABNORMAL HIGH (ref 0–22)

## 2015-03-04 LAB — COMPREHENSIVE METABOLIC PANEL
ALT: 13 U/L (ref 0–35)
AST: 14 U/L (ref 0–37)
Albumin: 3.7 g/dL (ref 3.5–5.2)
Alkaline Phosphatase: 57 U/L (ref 39–117)
BUN: 39 mg/dL — ABNORMAL HIGH (ref 6–23)
CO2: 24 mEq/L (ref 19–32)
Calcium: 8.8 mg/dL (ref 8.4–10.5)
Chloride: 107 mEq/L (ref 96–112)
Creatinine, Ser: 1.94 mg/dL — ABNORMAL HIGH (ref 0.40–1.20)
GFR: 27.87 mL/min — ABNORMAL LOW (ref >60.00)
Glucose, Bld: 86 mg/dL (ref 70–99)
Potassium: 3.9 mEq/L (ref 3.5–5.1)
Sodium: 137 mEq/L (ref 135–145)
Total Bilirubin: 0.3 mg/dL (ref 0.2–1.2)
Total Protein: 7.5 g/dL (ref 6.0–8.3)

## 2015-03-04 LAB — URIC ACID: Uric Acid, Serum: 4.9 mg/dL (ref 2.4–7.0)

## 2015-03-04 NOTE — Progress Notes (Signed)
[name] 04-27-1956 [id number]  Note: This dictation was prepared with Dragon digital system. Any transcriptional errors that result from this procedure are unintentional.   History of Present Illness: This is a 59 year old white female with ulcerative colitis since 71. Total colectomy and ileoproctostomy in 1998. History of pouchitis. Chronic diarrhea. Chronic renal insufficiency stage II. A history of iron deficiency and iron infusions. Last iron saturation 24%. She had a flareup of diarrhea approximately 2 months ago and was started on budesonide 9 mg daily with marked improvement of her symptoms. She has continued Humira 40 mg every 2 weeks. Flexible sigmoidoscopy in September 2012 showed ileorectal anastomosis with a mild stricture which was dilated with 15 mm  dilator. Biopsies were consistent with Crohn's disease. She could not afford budesonide but we were able to obtain samples for her    Past Medical History  Diagnosis Date  . History of small bowel obstruction   . Obesity   . Hyperparathyroidism   . Hypokalemia   . Hypertension   . Gastric polyp   . GERD (gastroesophageal reflux disease)   . Depression   . Anxiety   . Anemia   . Renal insufficiency   . Ulcerative colitis   . RLS (restless legs syndrome)   . Pulmonary nodule, right     right upper lobe  . Anal stenosis   . Esophagitis   . De Quervain's tenosynovitis   . Hyperlipidemia   . Fibroid   . History of cervical dysplasia   . Asthma   . Hearing loss   . Visual disturbance   . Abdominal pain   . Rectal bleeding   . Blood in stool   . Rectal pain   . Difficulty urinating   . Blood in urine   . Fainting     due to dehydration  . Weakness generalized   . Fatigue   . Easy bruising   . Hemorrhoids, internal   . Hemorrhoids, external   . Ovarian cyst   . Herpes, genital     vaginal treated 07/05/12 and pt states is resolved    Past Surgical History  Procedure Laterality Date  . Anal dilation     . 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 Ileostomy closure    . Cholecystectomy    . Tubal ligation    . Hemorrhoid surgery    . Fatty tumor removed from back    . Total abdominal hysterectomy  1998    TAH/LSO  . Cervical biopsy  w/ loop electrode excision    . Upper gastrointestinal endoscopy      Allergies  Allergen Reactions  . Morphine Other (See Comments)    Gives me crazy dreams  . Sulfonamide Derivatives Itching, Swelling and Rash    Family history and social history have been reviewed.  Review of Systems: Diarrhea has improved. Rectal irritation has improved. Occasional crampy abdominal pain. Weight has been stable  The remainder of the 10 point ROS is negative except as outlined in the H&P  Physical Exam: General Appearance Well developed, in no distress Eyes  Non icteric  HEENT  Non traumatic, normocephalic  Mouth No lesion, tongue papillated, no cheilosis Neck Supple without adenopathy, thyroid not enlarged, no carotid bruits, no JVD Lungs Clear to auscultation bilaterally COR Normal S1, normal S2, regular rhythm, no murmur, quiet precordium Abdomen soft mildly tender mostly left lower quadrant. Bouts of hyperactive bowel sounds.  No tympany. Well-healed surgical scar Rectal macerated perianal mucosa with intense erythema. Appears clean. No drainage. Digital exam not done at patient's request Extremities  No pedal edema Skin No lesions Neurological Alert and oriented x 3 Psychological Normal mood and affect  Assessment and Plan:   59 year old white female with ulcerative colitis, post total colectomy,hx of pouchitis. Active Crohn's disease at the anastomosis. Responded to Humira 40 mg every 2 weeks and budesonide 9 mg daily. She will reduce budesonide 9 mg every other day until she runs out. . We will then decide if she needs additional immunomodulators such as 6-MP or low-dose prednisone. Or increase her  Humira to every week. Today we'll be checking  sedimentation rate, CBC, metabolic panel, and uric acid    [name] 03/04/2015

## 2015-03-04 NOTE — Patient Instructions (Addendum)
Go to the basement today for labs Dr Servando Salina, Dr Lanell Matar

## 2015-03-07 LAB — QUANTIFERON TB GOLD ASSAY (BLOOD)
Interferon Gamma Release Assay: NEGATIVE
Mitogen value: 1.64 IU/mL
Quantiferon Nil Value: 0.08 IU/mL
Quantiferon Tb Ag Minus Nil Value: 0.02 IU/mL
TB Ag value: 0.1 IU/mL

## 2015-03-11 ENCOUNTER — Other Ambulatory Visit: Payer: Self-pay | Admitting: Internal Medicine

## 2015-03-14 ENCOUNTER — Other Ambulatory Visit: Payer: Self-pay

## 2015-03-14 MED ORDER — TEMAZEPAM 30 MG PO CAPS: 30.0000 mg | ORAL_CAPSULE | Freq: Every day | ORAL | Status: DC

## 2015-04-18 ENCOUNTER — Telehealth: Payer: Self-pay | Admitting: Internal Medicine

## 2015-04-18 MED ORDER — HYDROCORTISONE ACETATE 25 MG RE SUPP: RECTAL | Status: DC

## 2015-04-18 NOTE — Telephone Encounter (Signed)
Patient states the her pharmacy is sending a fax that her insurance will not pay for suppositories but will pay for a "cream."  Also states her pharmacist is sending a fax that insurance will not pay for Uceris but will pay for generic 3 mg tablets.

## 2015-04-18 NOTE — Telephone Encounter (Signed)
Patient states she had to stop Uceris because of the cost. She had stopped leaking when she was taking it. She is asking if there is a generic or a cheaper medication she can take. Please, advise.

## 2015-04-18 NOTE — Telephone Encounter (Signed)
Could she hold Hydrocortisone supp 25 mg, #12, 1 hs, 3 refills?

## 2015-04-18 NOTE — Telephone Encounter (Signed)
Spoke with patient and she will try this Rx sent.

## 2015-04-22 NOTE — Telephone Encounter (Signed)
Called [facility] and they are re-sending the fax's because we never got them.

## 2015-04-23 MED ORDER — BUDESONIDE 3 MG PO CPEP: ORAL_CAPSULE | ORAL | Status: DC

## 2015-04-23 NOTE — Telephone Encounter (Signed)
Called and left patient detailed voicemail that we are sending in budesonide rx and that with the budesonide she will not need supp or cream per Dr Olevia Perches.

## 2015-04-23 NOTE — Telephone Encounter (Signed)
If the insurance will pay for Budesonide ( generic), then , please, send Budesonide 3 mg, #90, 3 po qd x 4 weeks, 2 po qd x 4 weeks, then 1 po qd x 4 weeks, 2 refill. No need for supp or cream.

## 2015-04-23 NOTE — Telephone Encounter (Signed)
For PJ

## 2015-04-23 NOTE — Telephone Encounter (Signed)
Please advise since hydrocortisone 25mg  suppositories not covered a replacement rx.  Thank you.

## 2015-05-05 ENCOUNTER — Telehealth: Payer: Self-pay | Admitting: Internal Medicine

## 2015-05-05 NOTE — Telephone Encounter (Signed)
Spoke with patient and she states she needs a refill of Humira. Told patient we need to schedule an OV for her. Scheduled with Alonza Bogus, PA on 05/07/15 at 9:30 AM. Patient also state she is upset because she did not know Dr. Olevia Perches was retiring so soon.Talked with patient about this and she felt better.

## 2015-05-06 DIAGNOSIS — M109 Gout, unspecified: Secondary | ICD-10-CM | POA: Diagnosis not present

## 2015-05-06 DIAGNOSIS — D509 Iron deficiency anemia, unspecified: Secondary | ICD-10-CM | POA: Diagnosis not present

## 2015-05-06 DIAGNOSIS — N179 Acute kidney failure, unspecified: Secondary | ICD-10-CM | POA: Diagnosis not present

## 2015-05-06 DIAGNOSIS — N182 Chronic kidney disease, stage 2 (mild): Secondary | ICD-10-CM | POA: Diagnosis not present

## 2015-05-07 ENCOUNTER — Ambulatory Visit (INDEPENDENT_AMBULATORY_CARE_PROVIDER_SITE_OTHER): Payer: Medicare Other | Admitting: Gastroenterology

## 2015-05-07 ENCOUNTER — Encounter: Payer: Self-pay | Admitting: Gastroenterology

## 2015-05-07 VITALS — BP 124/82 | HR 76 | Ht 63.25 in | Wt 177.1 lb

## 2015-05-07 DIAGNOSIS — R197 Diarrhea, unspecified: Secondary | ICD-10-CM | POA: Diagnosis not present

## 2015-05-07 DIAGNOSIS — K51019 Ulcerative (chronic) pancolitis with unspecified complications: Secondary | ICD-10-CM | POA: Diagnosis not present

## 2015-05-07 MED ORDER — CHOLESTYRAMINE 4 G PO PACK: 4.0000 g | PACK | Freq: Every day | ORAL | Status: DC

## 2015-05-07 NOTE — Progress Notes (Signed)
     05/07/2015 [name] [id number] 1956/08/02   History of Present Illness:  This is a 59 year old white female with IBD (? ulcerative colitis vs Crohn's) since 1990.  Has been a long-standing patient of Dr. Nichola Sizer last seen on 03/04/2015.  She had a total colectomy and ileoproctostomy in 1998. History of pouchitis. Chronic diarrhea. Chronic renal insufficiency stage II.  A history of iron deficiency and iron infusions.  She had a flareup of diarrhea in April and was started on budesonide 9 mg daily with marked improvement of her symptoms.  She has continued Humira 40 mg every 2 weeks. Flexible sigmoidoscopy in September 2012 showed ileorectal anastomosis with a mild stricture which was dilated with 15 mm dilator. Biopsies were consistent with Crohn's disease.  She says that she self dilates her anus/rectum at home.  She continues on Budesonide 9 mg daily per Dr. Nichola Sizer instructions.  She is requesting a referral to [facility] for another opinion on her disease.  She is here today primarily to obtain Humira sample and prescription renewal.  Last dose of Humira was 8/22.  She tells me that she still has diarrhea, which once again is chronic as well as complaints of rectal pain.  Gets up 2-3 times at night to have a BM, which she says has been her normal.  Gets "rectal spasms".   Current Medications, Allergies, Past Medical History, Past Surgical History, Family History and Social History were reviewed in [employer] record.   Physical Exam: BP 124/82 mmHg  Pulse 76  Ht 5' 3.25" (1.607 m)  Wt 177 lb 2 oz (80.343 kg)  BMI 31.11 kg/m2 General: Well developed female in no acute distress Head: Normocephalic and atraumatic Eyes:  Sclerae anicteric, conjunctiva pink  Ears: Normal auditory acuity Lungs: Clear throughout to auscultation Heart: Regular rate and rhythm Abdomen: Soft, non-distended.  Normal bowel sounds.  Scar from previous surgery noted.  Mild diffuse  TTP. Rectal:  Not done at patient's request. Musculoskeletal: Symmetrical with no gross deformities  Extremities: No edema  Neurological: Alert oriented x 4, grossly non-focal Psychological:  Alert and cooperative. Normal mood and affect  Assessment and Recommendations: *59 year old white female with IBD (? UC vs Crohn's), post total colectomy, hx of pouchitis. Active disease at the anastomosis.  On Humira 40 mg every 2 weeks and budesonide 9 mg daily. Instructions for budesonide taper given by Dr. Olevia Perches.  Decision was to be made if she needs to go to weekly Humira vs additional immunomodulators such as 6-MP, etc.  Patient would like a referral to [facility], but is willing to see Dr. Havery Moros back here in follow-up to establish care with him and have further discussion first.  She is here today because she needs Humira sample and prescription renewal.  Will try adding questran daily to help with symptomatic management of diarrhea in the interim.  Also discussed possible course of flagyl to treat pouchitis, but elected for the questran instead.

## 2015-05-07 NOTE — Patient Instructions (Signed)
We will renew your Humira  Your follow up appointment with Dr Havery Moros is scheduled on 06/25/2015 at 9:45am We have sent in [place] to your pharmacy

## 2015-05-13 ENCOUNTER — Telehealth: Payer: Self-pay | Admitting: *Deleted

## 2015-05-13 NOTE — Telephone Encounter (Signed)
We received a fax from [facility]. They are requesting a prescription for Humira 40 mg pen 2 pack.  Patient last saw Alonza Bogus PA-C on 05-07-2015. Janett Billow noted the patient responded to Humira 40 mg every 2 weeks The patient has an appointment scheduled with Dr. [place] Cellar on 06-25-2015. Asking if we are refilling the Humira?

## 2015-05-21 ENCOUNTER — Telehealth: Payer: Self-pay | Admitting: Family Medicine

## 2015-05-21 DIAGNOSIS — M10071 Idiopathic gout, right ankle and foot: Secondary | ICD-10-CM

## 2015-05-21 NOTE — Telephone Encounter (Signed)
Relation to BB:[id number]  Call back number:[phone]   Reason for call:  Patient requesting a refill HYDROcodone-acetaminophen (NORCO/VICODIN) 5-325 MG per tablet

## 2015-05-22 ENCOUNTER — Telehealth: Payer: Self-pay | Admitting: Internal Medicine

## 2015-05-22 MED ORDER — ADALIMUMAB 40 MG/0.8ML ~~LOC~~ AJKT: 40.0000 mg | AUTO-INJECTOR | SUBCUTANEOUS | Status: DC

## 2015-05-22 MED ORDER — HYDROCODONE-ACETAMINOPHEN 5-325 MG PO TABS: 2.0000 | ORAL_TABLET | ORAL | Status: DC | PRN

## 2015-05-22 NOTE — Telephone Encounter (Signed)
Patient aware the Hydrocodone will be ready for pick up today after lunch.      KP

## 2015-05-22 NOTE — Telephone Encounter (Signed)
I was advised to route this to Alonza Bogus PA-C who last saw the patient.

## 2015-05-22 NOTE — Telephone Encounter (Signed)
Last seen and filled  12/26/14 #90 No UDS and No contract.  Please advise     KP

## 2015-05-22 NOTE — Telephone Encounter (Signed)
Refill x1 Need uds and contract

## 2015-05-22 NOTE — Telephone Encounter (Signed)
Rx from [facility] located and faxed rx to them at [phone]. Patient aware.

## 2015-05-22 NOTE — Telephone Encounter (Signed)
Spoke with patient and called [facility] with refill rx.

## 2015-05-26 ENCOUNTER — Telehealth: Payer: Self-pay | Admitting: *Deleted

## 2015-05-26 ENCOUNTER — Telehealth: Payer: Self-pay | Admitting: Emergency Medicine

## 2015-05-26 DIAGNOSIS — Z79899 Other long term (current) drug therapy: Secondary | ICD-10-CM | POA: Diagnosis not present

## 2015-05-26 MED ORDER — TEMAZEPAM 30 MG PO CAPS: 30.0000 mg | ORAL_CAPSULE | Freq: Every day | ORAL | Status: DC

## 2015-05-26 NOTE — Telephone Encounter (Signed)
Prior Authorization Rx approval from [facility] sent over for Humira Pen good until 05/22/2016. Reference # [id number]

## 2015-05-26 NOTE — Telephone Encounter (Signed)
I have spoken to patient to advise her of the information below as per Dr Havery Moros. She verbalizes understanding. I have sent a 1 month supply of temazepam to patient's pharmacy.

## 2015-05-26 NOTE — Telephone Encounter (Signed)
You can refill her enough to get to the clinic appointment with me in October, but please advise her that I do not prescribe this medication for long term management. I will discuss alternatives with her at the clinic visit. If she wishes to continue this for long term, she will need to see her primary care regarding this specific medication. Thanks. I will reassess her otherwise at the office visit.

## 2015-05-26 NOTE — Telephone Encounter (Signed)
Dr Havery Moros- Former Dr Olevia Perches patient requests refills of temazepam qhs. Saw Alonza Bogus, PA-C 05/13/15 and has upcoming appointment with you on 06/25/15. Shall I fill this prescription?

## 2015-06-02 ENCOUNTER — Encounter: Payer: Self-pay | Admitting: Gynecology

## 2015-06-02 ENCOUNTER — Ambulatory Visit (INDEPENDENT_AMBULATORY_CARE_PROVIDER_SITE_OTHER): Payer: Medicare Other | Admitting: Gynecology

## 2015-06-02 VITALS — BP 136/88 | Ht 63.0 in | Wt 172.0 lb

## 2015-06-02 DIAGNOSIS — N8320 Unspecified ovarian cysts: Secondary | ICD-10-CM

## 2015-06-02 DIAGNOSIS — N83201 Unspecified ovarian cyst, right side: Secondary | ICD-10-CM

## 2015-06-02 DIAGNOSIS — Z01419 Encounter for gynecological examination (general) (routine) without abnormal findings: Secondary | ICD-10-CM

## 2015-06-02 DIAGNOSIS — Z23 Encounter for immunization: Secondary | ICD-10-CM

## 2015-06-02 NOTE — Progress Notes (Signed)
[name] 02-Sep-1956 [id number]   History:    59 y.o.  for annual gyn exam with the following history: In 2013 when she was being evaluated by her urologist for nephrolithiasis a CT scan had demonstrated a right ovarian cyst measured 2.8 cm. Patient with prior history of total abdominal hysterectomy with left salpingo-oophorectomy. The ultrasound done in office on 02/14/2013 demonstrated the cyst to measure 3.7 x 3.8 x 2.5 cm irregular-shaped thinwall with scattered low-level echoes. She had a normal CA 125 and had received Depo-Provera 150 mg IM and on followup ultrasound July 2013 demonstrated a cyst measuring 3.5 x 2.4 x 3.7 cm thin echo free totally avascular.  Patient has had history of ulcerative colitis since 1990 and has been followed by Dr. Maurene Capes.She underwent a total colectomy and ileoproctostomy in 1998 for intractable disease. She has a history of pouchitis. Her last flexible sigmoidoscopy in September 2012 showed active inflammatory bowel disease at the anastomosis at 20 cm.  She has been followed for renal insufficiency by Dr Servando Salina.   She had received an additional Depo-Provera shot in January 2014 the ultrasound demonstrated a cyst gotten smaller measuring 29 x 21 x 31 mm ever size 2.7 mm.CA 125 in 2000 at a value of 7.3 and on repeat in 2014 had a value of 8.1.   Follow-up ultrasound October 2015 demonstrated the following: Absent uterus and left adnexa. Right ovary thickwalled follicle measuring 7 x 7 mm no change. Thinwall echo free avascular cyst measuring 29 x 18 x 22 mm average size 23 mm decreased in size from previous scan.  Patient with no prior history of any abnormal Pap smears Patient's last bone density study was in 2014. Report was normal. There was statistically significant decrease in bone mineralization at the AP spine, left and right femoral neck.  Past medical history,surgical history, family history and social history were all reviewed and  documented in the EPIC chart.  Gynecologic History No LMP recorded. Patient has had a hysterectomy. Contraception: status post hysterectomy Last Pap: 2012. Results were: normal Last mammogram: 2016. Results were: normal  Obstetric History OB History  Gravida Para Term Preterm AB SAB TAB Ectopic Multiple Living  4 2 2  2 2    2     # Outcome Date GA Lbr Len/2nd Weight Sex Delivery Anes PTL Lv  4 SAB           3 SAB           2 Term     F Vag-Spont  N Y  1 Term     M Vag-Spont  N Y       ROS: A ROS was performed and pertinent positives and negatives are included in the history.  GENERAL: No fevers or chills. HEENT: No change in vision, no earache, sore throat or sinus congestion. NECK: No pain or stiffness. CARDIOVASCULAR: No chest pain or pressure. No palpitations. PULMONARY: No shortness of breath, cough or wheeze. GASTROINTESTINAL: No abdominal pain, nausea, vomiting or diarrhea, melena or bright red blood per rectum. GENITOURINARY: No urinary frequency, urgency, hesitancy or dysuria. MUSCULOSKELETAL: No joint or muscle pain, no back pain, no recent trauma. DERMATOLOGIC: No rash, no itching, no lesions. ENDOCRINE: No polyuria, polydipsia, no heat or cold intolerance. No recent change in weight. HEMATOLOGICAL: No anemia or easy bruising or bleeding. NEUROLOGIC: No headache, seizures, numbness, tingling or weakness. PSYCHIATRIC: No depression, no loss of interest in normal activity or change in sleep pattern.  Exam: chaperone present  BP 136/88 mmHg  Ht 5' 3"  (1.6 m)  Wt 172 lb (78.019 kg)  BMI 30.48 kg/m2  Body mass index is 30.48 kg/(m^2).  General appearance : Well developed well nourished female. No acute distress HEENT: Eyes: no retinal hemorrhage or exudates,  Neck supple, trachea midline, no carotid bruits, no thyroidmegaly Lungs: Clear to auscultation, no rhonchi or wheezes, or rib retractions  Heart: Regular rate and rhythm, no murmurs or gallops Breast:Examined in  sitting and supine position were symmetrical in appearance, no palpable masses or tenderness,  no skin retraction, no nipple inversion, no nipple discharge, no skin discoloration, no axillary or supraclavicular lymphadenopathy Abdomen: no palpable masses or tenderness, no rebound or guarding Extremities: no edema or skin discoloration or tenderness  Pelvic:  Bartholin, Urethra, Skene Glands: Within normal limits             Vagina: No gross lesions or discharge  Cervix: Absent  Uterus  absent  Adnexa  Without masses or tenderness  Anus and perineum  normal   Rectovaginal  normal sphincter tone without palpated masses or tenderness             Hemoccult PCP provides     Assessment/Plan:  59 y.o. female for annual exam received the flu vaccine today. Follow-up ultrasound on small benign appearing ovarian cyst next month. PCP we'll be doing her blood work. Patient was reminded to schedule her bone density study at time of her mammogram in January. Pap smear not indicated. We discussed importance of monthly breast exam. Importance of calcium vitamin D and regular exercise for osteoporosis prevention.   Terrance Mass MD, 11:39 AM 06/02/2015

## 2015-06-09 ENCOUNTER — Encounter: Payer: Self-pay | Admitting: Gastroenterology

## 2015-06-09 NOTE — Progress Notes (Signed)
Agree with plan I will see her for reassessment in my clinic.

## 2015-06-16 DIAGNOSIS — H5203 Hypermetropia, bilateral: Secondary | ICD-10-CM | POA: Diagnosis not present

## 2015-06-16 DIAGNOSIS — H524 Presbyopia: Secondary | ICD-10-CM | POA: Diagnosis not present

## 2015-06-20 ENCOUNTER — Encounter: Payer: Self-pay | Admitting: Gynecology

## 2015-06-20 ENCOUNTER — Ambulatory Visit (INDEPENDENT_AMBULATORY_CARE_PROVIDER_SITE_OTHER): Payer: Medicare Other

## 2015-06-20 ENCOUNTER — Ambulatory Visit (INDEPENDENT_AMBULATORY_CARE_PROVIDER_SITE_OTHER): Payer: Medicare Other | Admitting: Gynecology

## 2015-06-20 VITALS — BP 108/76

## 2015-06-20 DIAGNOSIS — N83201 Unspecified ovarian cyst, right side: Secondary | ICD-10-CM

## 2015-06-20 NOTE — Progress Notes (Signed)
   Patient presented to the office today for follow-up on a small right ovarian cyst that has been monitored for several years. Her history is as follows:  In 2013 when she was being evaluated by her urologist for nephrolithiasis a CT scan had demonstrated a right ovarian cyst measured 2.8 cm. Patient with prior history of total abdominal hysterectomy with left salpingo-oophorectomy. The ultrasound done in office on 02/14/2013 demonstrated the cyst to measure 3.7 x 3.8 x 2.5 cm irregular-shaped thinwall with scattered low-level echoes. She had a normal CA 125 and had received Depo-Provera 150 mg IM and on followup ultrasound July 2013 demonstrated a cyst measuring 3.5 x 2.4 x 3.7 cm thin echo free totally avascular.  Patient has had history of ulcerative colitis since 1990 and has been followed by Dr. Maurene Capes.She underwent a total colectomy and ileoproctostomy in 1998 for intractable disease. She has a history of pouchitis. Her last flexible sigmoidoscopy in September 2012 showed active inflammatory bowel disease at the anastomosis at 20 cm.  She has been followed for renal insufficiency by Dr Servando Salina.   She had received an additional Depo-Provera shot in January 2014 the ultrasound demonstrated a cyst gotten smaller measuring 29 x 21 x 31 mm ever size 2.7 mm.CA 125 in 2000 at a value of 7.3 and on repeat in 2014 had a value of 8.1.   Follow-up ultrasound October 2015 demonstrated the following: Absent uterus and left adnexa. Right ovary thickwalled follicle measuring 7 x 7 mm no change. Thinwall echo free avascular cyst measuring 29 x 18 x 22 mm average size 23 mm decreased in size from previous scan.  Patient with no prior history of any abnormal Pap smears  Patient is asymptomatic today.  Ultrasound: Absent uterus and left adnexa. Vaginal cuff normal. Right ovary continued presence of a thinwall echo-free avascular cyst measuring 24 bites 17 x 25 mm average size 22 mm. No change from prior  scan of 2015. Left adnexa normal no fluid in the cul-de-sac.  Assessment/plan: Small echo-free thinwall right ovarian cyst specimen present for many years unchanged. Patient reassured. Patient with multiple abdominal surgeries would be a risk for surgery for this insignificant benign-appearing cyst. Patient agrees. Nevertheless we will do a CA 125 today and if normal will follow-up with an ultrasound in a year at time of her annual exam unless she becomes symptomatic.

## 2015-06-20 NOTE — Patient Instructions (Signed)
CA-125 Tumor Marker Test WHY AM I HAVING THIS TEST? This test is used to check the level of cancer antigen 125 (CA-125) in your blood. The CA-125 tumor marker test can be helpful in detecting ovarian cancer. The test is only performed if you are considered at high risk for ovarian cancer. Your health care provider may recommend this test if:  You have a strong family history of ovarian cancer.  You have a breast cancer antigen (BRCA) genetic defect. If you have already been diagnosed with ovarian cancer, your health care provider may use this test to help identify the extent of the disease and to monitor your response to treatment. WHAT KIND OF SAMPLE IS TAKEN? A blood sample is required for this test. It is usually collected by inserting a needle into a vein. HOW DO I PREPARE FOR THE TEST? There is no preparation required for this test. WHAT ARE THE REFERENCE RANGES? Reference ranges are considered healthy ranges established after testing a large group of healthy people. Reference ranges may vary among different people, labs, and hospitals. It is your responsibility to obtain your test results. Ask the lab or department performing the test when and how you will get your results. The reference range for this test is 0-35 units/mL or less than 35 kunits/L (SI units). WHAT DO THE RESULTS MEAN? Increased levels of CA-125 may indicate:  Certain types of cancer, including:  Ovarian cancer.  Pancreatic cancer.  Colon cancer.  Lung cancer.  Breast cancer.  Lymphoma.  Noncancerous (benign) disorders, including:  Cirrhosis.  Pregnancy.  Endometriosis.  Pancreatitis.  Pelvic inflammatory disease (PID). Talk with your health care provider to discuss your results, treatment options, and if necessary, the need for more tests. Talk with your health care provider if you have any questions about your results.   This information is not intended to replace advice given to you by your  health care provider. Make sure you discuss any questions you have with your health care provider.   Document Released: 09/14/2004 Document Revised: 09/13/2014 Document Reviewed: 01/10/2014 Elsevier Interactive Patient Education 2016 Elsevier Inc. Ovarian Cyst An ovarian cyst is a fluid-filled sac that forms on an ovary. The ovaries are small organs that produce eggs in women. Various types of cysts can form on the ovaries. Most are not cancerous. Many do not cause problems, and they often go away on their own. Some may cause symptoms and require treatment. Common types of ovarian cysts include:  Functional cysts--These cysts may occur every month during the menstrual cycle. This is normal. The cysts usually go away with the next menstrual cycle if the woman does not get pregnant. Usually, there are no symptoms with a functional cyst.  Endometrioma cysts--These cysts form from the tissue that lines the uterus. They are also called "chocolate cysts" because they become filled with blood that turns brown. This type of cyst can cause pain in the lower abdomen during intercourse and with your menstrual period.  Cystadenoma cysts--This type develops from the cells on the outside of the ovary. These cysts can get very big and cause lower abdomen pain and pain with intercourse. This type of cyst can twist on itself, cut off its blood supply, and cause severe pain. It can also easily rupture and cause a lot of pain.  Dermoid cysts--This type of cyst is sometimes found in both ovaries. These cysts may contain different kinds of body tissue, such as skin, teeth, hair, or cartilage. They usually do not cause   symptoms unless they get very big.  Theca lutein cysts--These cysts occur when too much of a certain hormone (human chorionic gonadotropin) is produced and overstimulates the ovaries to produce an egg. This is most common after procedures used to assist with the conception of a baby (in vitro  fertilization). CAUSES   Fertility drugs can cause a condition in which multiple large cysts are formed on the ovaries. This is called ovarian hyperstimulation syndrome.  A condition called polycystic ovary syndrome can cause hormonal imbalances that can lead to nonfunctional ovarian cysts. SIGNS AND SYMPTOMS  Many ovarian cysts do not cause symptoms. If symptoms are present, they may include:  Pelvic pain or pressure.  Pain in the lower abdomen.  Pain during sexual intercourse.  Increasing girth (swelling) of the abdomen.  Abnormal menstrual periods.  Increasing pain with menstrual periods.  Stopping having menstrual periods without being pregnant. DIAGNOSIS  These cysts are commonly found during a routine or annual pelvic exam. Tests may be ordered to find out more about the cyst. These tests may include:  Ultrasound.  X-ray of the pelvis.  CT scan.  MRI.  Blood tests. TREATMENT  Many ovarian cysts go away on their own without treatment. Your health care provider may want to check your cyst regularly for 2-3 months to see if it changes. For women in menopause, it is particularly important to monitor a cyst closely because of the higher rate of ovarian cancer in menopausal women. When treatment is needed, it may include any of the following:  A procedure to drain the cyst (aspiration). This may be done using a long needle and ultrasound. It can also be done through a laparoscopic procedure. This involves using a thin, lighted tube with a tiny camera on the end (laparoscope) inserted through a small incision.  Surgery to remove the whole cyst. This may be done using laparoscopic surgery or an open surgery involving a larger incision in the lower abdomen.  Hormone treatment or birth control pills. These methods are sometimes used to help dissolve a cyst. HOME CARE INSTRUCTIONS   Only take over-the-counter or prescription medicines as directed by your health care  provider.  Follow up with your health care provider as directed.  Get regular pelvic exams and Pap tests. SEEK MEDICAL CARE IF:   Your periods are late, irregular, or painful, or they stop.  Your pelvic pain or abdominal pain does not go away.  Your abdomen becomes larger or swollen.  You have pressure on your bladder or trouble emptying your bladder completely.  You have pain during sexual intercourse.  You have feelings of fullness, pressure, or discomfort in your stomach.  You lose weight for no apparent reason.  You feel generally ill.  You become constipated.  You lose your appetite.  You develop acne.  You have an increase in body and facial hair.  You are gaining weight, without changing your exercise and eating habits.  You think you are pregnant. SEEK IMMEDIATE MEDICAL CARE IF:   You have increasing abdominal pain.  You feel sick to your stomach (nauseous), and you throw up (vomit).  You develop a fever that comes on suddenly.  You have abdominal pain during a bowel movement.  Your menstrual periods become heavier than usual. MAKE SURE YOU:  Understand these instructions.  Will watch your condition.  Will get help right away if you are not doing well or get worse.   This information is not intended to replace advice given to   you by your health care provider. Make sure you discuss any questions you have with your health care provider.   Document Released: 08/23/2005 Document Revised: 08/28/2013 Document Reviewed: 04/30/2013 Elsevier Interactive Patient Education 2016 Elsevier Inc.  

## 2015-06-25 ENCOUNTER — Encounter: Payer: Self-pay | Admitting: Gastroenterology

## 2015-06-25 ENCOUNTER — Other Ambulatory Visit (INDEPENDENT_AMBULATORY_CARE_PROVIDER_SITE_OTHER): Payer: Medicare Other

## 2015-06-25 ENCOUNTER — Other Ambulatory Visit: Payer: Medicare Other

## 2015-06-25 ENCOUNTER — Ambulatory Visit (INDEPENDENT_AMBULATORY_CARE_PROVIDER_SITE_OTHER): Payer: Medicare Other | Admitting: Gastroenterology

## 2015-06-25 VITALS — BP 144/90 | HR 68 | Ht 63.0 in | Wt 169.0 lb

## 2015-06-25 DIAGNOSIS — Z9049 Acquired absence of other specified parts of digestive tract: Secondary | ICD-10-CM | POA: Diagnosis not present

## 2015-06-25 DIAGNOSIS — Z79899 Other long term (current) drug therapy: Secondary | ICD-10-CM | POA: Diagnosis not present

## 2015-06-25 DIAGNOSIS — K91850 Pouchitis: Secondary | ICD-10-CM

## 2015-06-25 DIAGNOSIS — K6389 Other specified diseases of intestine: Secondary | ICD-10-CM | POA: Diagnosis not present

## 2015-06-25 DIAGNOSIS — K529 Noninfective gastroenteritis and colitis, unspecified: Secondary | ICD-10-CM

## 2015-06-25 LAB — BASIC METABOLIC PANEL
BUN: 28 mg/dL — ABNORMAL HIGH (ref 6–23)
CO2: 25 mEq/L (ref 19–32)
Calcium: 10 mg/dL (ref 8.4–10.5)
Chloride: 104 mEq/L (ref 96–112)
Creatinine, Ser: 2.37 mg/dL — ABNORMAL HIGH (ref 0.40–1.20)
GFR: 22.09 mL/min — ABNORMAL LOW (ref >60.00)
Glucose, Bld: 91 mg/dL (ref 70–99)
Potassium: 4.1 mEq/L (ref 3.5–5.1)
Sodium: 138 mEq/L (ref 135–145)

## 2015-06-25 LAB — COMPREHENSIVE METABOLIC PANEL
ALT: 15 U/L (ref 0–35)
AST: 19 U/L (ref 0–37)
Albumin: 3.8 g/dL (ref 3.5–5.2)
Alkaline Phosphatase: 58 U/L (ref 39–117)
BUN: 28 mg/dL — ABNORMAL HIGH (ref 6–23)
CO2: 25 mEq/L (ref 19–32)
Calcium: 10 mg/dL (ref 8.4–10.5)
Chloride: 104 mEq/L (ref 96–112)
Creatinine, Ser: 2.37 mg/dL — ABNORMAL HIGH (ref 0.40–1.20)
GFR: 22.09 mL/min — ABNORMAL LOW (ref >60.00)
Glucose, Bld: 91 mg/dL (ref 70–99)
Potassium: 4.1 mEq/L (ref 3.5–5.1)
Sodium: 138 mEq/L (ref 135–145)
Total Bilirubin: 0.4 mg/dL (ref 0.2–1.2)
Total Protein: 7.2 g/dL (ref 6.0–8.3)

## 2015-06-25 NOTE — Progress Notes (Signed)
HPI :  59 year old white female with IBD (? ulcerative colitis vs Crohn's) since 1990. She is a former patient of Dr. Olevia Perches, new to me. 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 She reports she has had chronic symptoms since the time of her surgery. She has variable symptoms over time. She has diarrhea with urgency at times, at minimum she has a BM every hour or so, she reports previously this was more frequent. She reports ongoing nocturnal symptoms which she states she has had longstanding. She has blood in the stools periodically. She has abdominal pains and cramping. No fevers. She thinks her weight has been stable, no weight loss. She reports she was never able to be put in remission prior to colectomy. She reports only being exposed to steroids prior to her surgery. Post-operatively, she was on Remicade for 3-4 years but she is not sure if it helped too much. She has been on Humira for almost 1.5 yrs. She is taking it every other week or so. She thinks Humira has helped more than Remicade, but still does not feel as if she is in remission. She thinks she does well after her initial dose but then loses response near the end of her next dose. She has been on 6MP remotely but did not work for her. She has never been exposed to [facility]. She reports multiple courses of antibiotics with cipro and flagyl with ? Response. She has not been on probiotics. She has been on budesonider with ? Relief. She denies NSAID use. She has a history of CKD. Father and daughter have UC. No tobacco use.  Health Care Maintenance: Flu shot - 2016 UTD Pneumococcal - 2-3 yrs ago UTD Colonoscopy- 2012 - pouchitis / proctitis, anastomotic inflammation with mild stricture at anastomosis Vitamin D Derm - not recently TB  - negative 2016   Past Medical History  Diagnosis Date  . History of small bowel obstruction   . Obesity   . Hyperparathyroidism   . Hypokalemia   . Hypertension   .  Gastric polyp   . GERD (gastroesophageal reflux disease)   . Depression   . Anxiety   . Anemia   . Renal insufficiency   . Ulcerative colitis   . RLS (restless legs syndrome)   . Pulmonary nodule, right     right upper lobe  . Anal stenosis   . Esophagitis   . De Quervain's tenosynovitis   . Hyperlipidemia   . Fibroid   . History of cervical dysplasia   . Asthma   . Hearing loss   . Visual disturbance   . Abdominal pain   . Rectal bleeding   . Blood in stool   . Rectal pain   . Difficulty urinating   . Blood in urine   . Fainting     due to dehydration  . Weakness generalized   . Fatigue   . Easy bruising   . Hemorrhoids, internal   . Hemorrhoids, external   . Ovarian cyst   . Herpes, genital     vaginal treated 07/05/12 and pt states is resolved     Past Surgical History  Procedure Laterality Date  . Anal dilation    . 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 Ileostomy closure    . Cholecystectomy    . Tubal ligation    . Hemorrhoid surgery    .  Fatty tumor removed from back    . Total abdominal hysterectomy  1998    TAH/LSO  . Cervical biopsy  w/ loop electrode excision    . Upper gastrointestinal endoscopy     Family History  Problem Relation Age of Onset  . Heart disease Father   . Ulcerative colitis Father   . Hypertension Father   . Ulcerative colitis Daughter   . Irritable bowel syndrome      grandchildren  . Colon cancer Neg Hx   . Hypertension Mother   . Heart disease Mother   . Diabetes Sister   . Cancer Sister     uterine  . Cancer Maternal Uncle     LUNG   Social History  Substance Use Topics  . Smoking status: Former Smoker    Types: Cigarettes    Quit date: 05/21/1979  . Smokeless tobacco: Never Used     Comment: quit age 38  . Alcohol Use: No   Current Outpatient Prescriptions  Medication Sig Dispense Refill  . Adalimumab (HUMIRA PEN) 40 MG/0.8ML PNKT Inject 40 mg into the  skin every 14 (fourteen) days. 2 each 3  . albuterol (PROAIR HFA) 108 (90 BASE) MCG/ACT inhaler Inhale 2 puffs into the lungs every 6 (six) hours as needed for wheezing. 1 Inhaler 3  . budesonide (ENTOCORT EC) 3 MG 24 hr capsule Take 3 capsules daily x 4 weeks, then 2 capsules daily x 4 weeks, then one capsule daily x 4 weeks 90 capsule 2  . calcitRIOL (ROCALTROL) 0.25 MCG capsule Take 0.25 mcg by mouth every other day. Mon, Wed, Fri    . cholestyramine Lucrezia Starch) 4 G packet Take 1 packet (4 g total) by mouth daily. 30 each 2  . CYMBALTA 60 MG capsule TAKE 2 TABLETS BY MOUTH ONCE  DAILY 180 capsule 3  . febuxostat (ULORIC) 40 MG tablet Take 1 tablet (40 mg total) by mouth daily. 30 tablet 11  . Fluticasone-Salmeterol (ADVAIR) 250-50 MCG/DOSE AEPB Inhale 1 puff into the lungs 2 (two) times daily as needed (for wheezing or shortness of breath).     Marland Kitchen HYDROcodone-acetaminophen (NORCO/VICODIN) 5-325 MG per tablet Take 2 tablets by mouth every 4 (four) hours as needed for moderate pain or severe pain. 90 tablet 0  . mesalamine (CANASA) 1000 MG suppository Place 1 suppository (1,000 mg total) rectally at bedtime. 30 suppository 3  . metoprolol succinate (TOPROL-XL) 100 MG 24 hr tablet TAKE 1/2 TABLET EVERY DAY 45 tablet 3  . Multiple Vitamins-Minerals (MULTIVITAL) CHEW Chew by mouth.    . ranitidine (ZANTAC) 150 MG tablet Take one po daily 30 tablet 6  . temazepam (RESTORIL) 30 MG capsule Take 1 capsule (30 mg total) by mouth at bedtime. 30 capsule 0   No current facility-administered medications for this visit.   Allergies  Allergen Reactions  . Morphine Other (See Comments)    Gives me crazy dreams  . Sulfonamide Derivatives Itching, Swelling and Rash     Review of Systems: All systems reviewed and negative except where noted in HPI.   Lab Results  Component Value Date   WBC 9.8 03/04/2015   HGB 10.1* 03/04/2015   HCT 30.8* 03/04/2015   MCV 94.0 03/04/2015   PLT 455.0* 03/04/2015     Lab Results  Component Value Date   CREATININE 1.94* 03/04/2015   BUN 39* 03/04/2015   NA 137 03/04/2015   K 3.9 03/04/2015   CL 107 03/04/2015   CO2 24 03/04/2015  Lab Results  Component Value Date   ALT 13 03/04/2015   AST 14 03/04/2015   ALKPHOS 57 03/04/2015   BILITOT 0.3 03/04/2015     Physical Exam: BP 144/90 mmHg  Pulse 68  Ht _0  (1.6 m)  Wt 169 lb (76.658 kg)  BMI 29.94 kg/m2 Constitutional: Pleasant,well-developed, female in no acute distress. HEENT: Normocephalic and atraumatic. Conjunctivae are normal. No scleral icterus. Neck supple.  Cardiovascular: Normal rate, regular rhythm.  Pulmonary/chest: Effort normal and breath sounds normal. No wheezing, rales or rhonchi. Abdominal: Soft, nondistended, mild lower abdominal TTP without rebound or guarding. Bowel sounds active throughout. There are no masses palpable. No hepatomegaly. Extremities: no edema Lymphadenopathy: No cervical adenopathy noted. Neurological: Alert and oriented to person place and time. Skin: Skin is warm and dry. No rashes noted. Psychiatric: Normal mood and affect. Behavior is normal.   ASSESSMENT AND PLAN: 59 y/o female with history of IBD, suspected Crohns, who is s/p colectomy with IPAA, with ongoing symptoms of diarrhea / abdominal cramping for years following her surgery. It does not seem she has been in "remission" of symptoms since her surgery. She has been on Humira for the past 1.5 yrs, to which she thinks she has had some response, but remains largely symptomatic. She has a reported history of pouchitis, on numerous courses of ABx and budesonide, however in discussion with the patient today it is not clear if this has helped her symptoms much over the years. At this time, she is a new patient to me and I have reviewed her records. Her prior colonoscopy showed some inflammatory changes - based on path I am concerned she may have Crohns of the pouch or Crohns at the anastomosis.  Otherwise chronic pouchitis is also possible vs. cuffitis of rectal remnant vs. pouch dysfunction vs. Infection (Cdiff or CMV?). It has been over 4 years since her last exam, to clarify what is driving her symptoms and restage her disease recommend a flex sig to evaluate the distal anatomy, as well as a CT enterography to assess the more proximal small bowel to evaluate for active Crohns. Recommend baseline labs at this time to include stool for C diff, as well as CBC, CMP, ESR, CRP, and will also obtain Humira trough levels to see if she needs a dose adjustment or if she has developed immunogenicity while being on monotherapy. If she has active Crohns of the pouch and Humira does not seem to be working, would consider Entyvio. I otherwise will start her on VSL#3 to see if this helps at all. To clarify, despite med list above she reports she has not been on budesonide in a while. Given her symptoms are chronic and stable, will await flex sig results to clarify if she has pouchitis, as she has had numerous course of Abx to date per her report. Otherwise regarding her health care maintenance, being on Humira with history of thiopurine use, will refer to dermatology for skin check.   The indications, risks, and benefits of flex sigmoidoscopy were explained to the patient in detail. Risks include but are not limited to bleeding, perforation, adverse reaction to medications, and cardiopulmonary compromise. Sequelae include but are not limited to the possibility of surgery, hospitalization, and mortality. The patient verbalized understanding and wished to proceed. All questions answered.. Further recommendations pending results of the exam.   Silsbee Cellar, MD [facility] Pager [phone]

## 2015-06-25 NOTE — Patient Instructions (Addendum)
Your physician has requested that you go to the basement for the following lab work before leaving today: BMET, [place] back to our lab 07/03/2015 to have labs drawn.   You have been scheduled for a flexible sigmoidoscopy. Please follow the written instructions given to you at your visit today. If you use inhalers (even only as needed), please bring them with you on the day of your procedure.     You have been scheduled for a CT scan of the abdomen and pelvis at [facility] (1st Floor Radiology).   You are scheduled on 06/30/2015 at 3:00pm. You should arrive at 1:30pm.

## 2015-06-26 ENCOUNTER — Telehealth: Payer: Self-pay

## 2015-06-26 NOTE — Telephone Encounter (Signed)
Called and had patient scheduled with [facility] for 08/07/2015 at 2:40pm. Called pt and informed her of appointment. Patient understands.

## 2015-06-30 ENCOUNTER — Ambulatory Visit (HOSPITAL_COMMUNITY)
Admission: RE | Admit: 2015-06-30 | Discharge: 2015-06-30 | Disposition: A | Discharge status: Home / Self Care | Payer: Medicare Other | Source: Ambulatory Visit | Attending: Gastroenterology | Admitting: Gastroenterology

## 2015-06-30 DIAGNOSIS — K91850 Pouchitis: Secondary | ICD-10-CM

## 2015-06-30 DIAGNOSIS — K529 Noninfective gastroenteritis and colitis, unspecified: Secondary | ICD-10-CM

## 2015-07-01 ENCOUNTER — Telehealth: Payer: Self-pay

## 2015-07-01 NOTE — Telephone Encounter (Signed)
-----   Message from Manus Gunning, MD sent at 06/30/2015  5:04 PM EDT ----- Regarding: RE: CT scan OK yes I saw that. Can you let the patient know I will await flex sig results and we can discuss when I see her for that. Thanks. Otherwise awaiting other labs (Humira levels, etc)  ----- Message -----    From: Margreta Journey, CMA    Sent: 06/30/2015   2:00 PM      To: Manus Gunning, MD Subject: CT scan                                        Pt had CT scan cx because her creatinine levels were too high

## 2015-07-01 NOTE — Telephone Encounter (Signed)
Called pt and informed her of this information. Patient understands

## 2015-07-02 ENCOUNTER — Telehealth: Payer: Self-pay | Admitting: Emergency Medicine

## 2015-07-02 ENCOUNTER — Telehealth: Payer: Self-pay | Admitting: Family Medicine

## 2015-07-02 NOTE — Telephone Encounter (Signed)
Can you please ask the patient to have this done by her PCM. I don't prescribe benzodiazepines or sleep aids, this needs to come from [facility]. Thanks

## 2015-07-02 NOTE — Telephone Encounter (Signed)
Patient informed and verbalized understanding

## 2015-07-02 NOTE — Telephone Encounter (Signed)
Patients pharmacy sent over request for Restoril 30 mg  Capsule at bedtime. Is it ok to fill this or should it go to her PCP?

## 2015-07-02 NOTE — Telephone Encounter (Addendum)
Relation to [id number] Call back [phone] Pharmacy: [place], [place] - [address]. [phone] (Phone) [phone] (Fax)          Reason for call:  Patient requesting a refill temazepam (RESTORIL) 30 MG capsule . Patient has a scheduled physical appointment for 11/11/2015

## 2015-07-03 MED ORDER — TEMAZEPAM 30 MG PO CAPS: 30.0000 mg | ORAL_CAPSULE | Freq: Every day | ORAL | Status: DC

## 2015-07-03 NOTE — Telephone Encounter (Signed)
Rx faxed

## 2015-07-03 NOTE — Telephone Encounter (Signed)
Last seen 12/26/14 and filled 05/26/15 #30   Please advise     KP

## 2015-07-03 NOTE — Telephone Encounter (Signed)
Refill x1---  5 refills

## 2015-07-04 ENCOUNTER — Other Ambulatory Visit (INDEPENDENT_AMBULATORY_CARE_PROVIDER_SITE_OTHER): Payer: Medicare Other

## 2015-07-04 DIAGNOSIS — K529 Noninfective gastroenteritis and colitis, unspecified: Secondary | ICD-10-CM

## 2015-07-04 DIAGNOSIS — K6389 Other specified diseases of intestine: Secondary | ICD-10-CM

## 2015-07-04 DIAGNOSIS — K91850 Pouchitis: Secondary | ICD-10-CM

## 2015-07-04 LAB — CBC WITH DIFFERENTIAL/PLATELET
Basophils Absolute: 0.1 10*3/uL (ref 0.0–0.1)
Basophils Relative: 0.5 % (ref 0.0–3.0)
Eosinophils Absolute: 0.5 10*3/uL (ref 0.0–0.7)
Eosinophils Relative: 4.1 % (ref 0.0–5.0)
HCT: 34 % — ABNORMAL LOW (ref 36.0–46.0)
Hemoglobin: 11.2 g/dL — ABNORMAL LOW (ref 12.0–15.0)
Lymphocytes Relative: 31.9 % (ref 12.0–46.0)
Lymphs Abs: 3.9 10*3/uL (ref 0.7–4.0)
MCHC: 33 g/dL (ref 30.0–36.0)
MCV: 93.2 fl (ref 78.0–100.0)
Monocytes Absolute: 0.9 10*3/uL (ref 0.1–1.0)
Monocytes Relative: 7.1 % (ref 3.0–12.0)
Neutro Abs: 6.8 10*3/uL (ref 1.4–7.7)
Neutrophils Relative %: 56.4 % (ref 43.0–77.0)
Platelets: 422 10*3/uL — ABNORMAL HIGH (ref 150.0–400.0)
RBC: 3.64 Mil/uL — ABNORMAL LOW (ref 3.87–5.11)
RDW: 14.1 % (ref 11.5–15.5)
WBC: 12.1 10*3/uL — ABNORMAL HIGH (ref 4.0–10.5)

## 2015-07-04 LAB — SEDIMENTATION RATE: Sed Rate: 86 mm/hr — ABNORMAL HIGH (ref 0–22)

## 2015-07-04 LAB — C-REACTIVE PROTEIN: CRP: 1 mg/dL (ref 0.5–20.0)

## 2015-07-18 LAB — ADALIMUMAB+AB (SERIAL MONITOR)
Adalimumab Drug Level: <0.6 ug/mL
Anti-Adalimumab Antibody: 26800 ng/mL — ABNORMAL HIGH

## 2015-07-22 ENCOUNTER — Other Ambulatory Visit: Payer: Self-pay | Admitting: *Deleted

## 2015-07-22 DIAGNOSIS — R197 Diarrhea, unspecified: Secondary | ICD-10-CM

## 2015-07-28 ENCOUNTER — Telehealth: Payer: Self-pay | Admitting: Gastroenterology

## 2015-07-28 NOTE — Telephone Encounter (Signed)
Spoke with Tai at [facility] and told him we are waiting to start [facility] in December. He states she is approved now. We will call when we are ready for drug to be shipped

## 2015-07-28 NOTE — Telephone Encounter (Signed)
Great news. I will await C Diff testing. We may be able to get flex sig added at a 0730 spot on an upcoming Endo day if it helps her start Entyvio sooner. We can try a course of budesonde in the interim once C diff is ruled out if her symptoms are bothering her. Thanks

## 2015-07-28 NOTE — Telephone Encounter (Signed)
Patient notified. She will do c. Diff testing. She forgot about it.

## 2015-07-31 ENCOUNTER — Other Ambulatory Visit: Payer: Self-pay | Admitting: Family Medicine

## 2015-08-05 NOTE — Telephone Encounter (Signed)
Rx faxed to pharmacy  

## 2015-08-07 ENCOUNTER — Other Ambulatory Visit: Payer: Medicare Other

## 2015-08-07 DIAGNOSIS — K91850 Pouchitis: Secondary | ICD-10-CM | POA: Diagnosis not present

## 2015-08-07 DIAGNOSIS — K6389 Other specified diseases of intestine: Secondary | ICD-10-CM | POA: Diagnosis not present

## 2015-08-07 DIAGNOSIS — L821 Other seborrheic keratosis: Secondary | ICD-10-CM | POA: Diagnosis not present

## 2015-08-07 DIAGNOSIS — K529 Noninfective gastroenteritis and colitis, unspecified: Secondary | ICD-10-CM

## 2015-08-07 DIAGNOSIS — L920 Granuloma annulare: Secondary | ICD-10-CM | POA: Diagnosis not present

## 2015-08-08 ENCOUNTER — Other Ambulatory Visit: Payer: Self-pay

## 2015-08-08 LAB — CLOSTRIDIUM DIFFICILE BY PCR: Toxigenic C. Difficile by PCR: DETECTED — CR

## 2015-08-08 MED ORDER — METRONIDAZOLE 500 MG PO TABS: 500.0000 mg | ORAL_TABLET | Freq: Three times a day (TID) | ORAL | Status: DC

## 2015-08-12 ENCOUNTER — Other Ambulatory Visit: Payer: Self-pay

## 2015-08-12 MED ORDER — RANITIDINE HCL 150 MG PO TABS: ORAL_TABLET | ORAL | Status: DC

## 2015-08-14 DIAGNOSIS — N182 Chronic kidney disease, stage 2 (mild): Secondary | ICD-10-CM | POA: Diagnosis not present

## 2015-08-14 DIAGNOSIS — D509 Iron deficiency anemia, unspecified: Secondary | ICD-10-CM | POA: Diagnosis not present

## 2015-08-14 DIAGNOSIS — M109 Gout, unspecified: Secondary | ICD-10-CM | POA: Diagnosis not present

## 2015-08-14 DIAGNOSIS — N179 Acute kidney failure, unspecified: Secondary | ICD-10-CM | POA: Diagnosis not present

## 2015-08-18 ENCOUNTER — Ambulatory Visit (INDEPENDENT_AMBULATORY_CARE_PROVIDER_SITE_OTHER): Payer: Medicare Other

## 2015-08-18 VITALS — BP 120/82 | HR 100 | Ht 64.0 in | Wt 169.8 lb

## 2015-08-18 DIAGNOSIS — Z0000 Encounter for general adult medical examination without abnormal findings: Secondary | ICD-10-CM

## 2015-08-18 NOTE — Progress Notes (Signed)
Subjective:   [name] is a 59 y.o. female who presents for an Initial Medicare Annual Wellness Visit.  Review of Systems: NO ROS  *Cardiac Risk Factors include: hypertension;sedentary lifestyle;dyslipidemia Sleep patterns:   Wakes up 4-5 times per night---to have BM (diarrhea) Home Safety/Smoke Alarms:  Feels safe at home.  Lives alone with 3 dogs.  Smoke alarms present.   Firearm Safety:  Kept in safe place.   Seat Belt Safety/Bike Helmet:  Always wears seat belt.   Sun exposure:  Limited sun exposure.    Counseling:   Eye Exam-  Couple months ago.   Dental- Hasn't been in awhile.  Unable to go due to finances.   Female:  Pap-Hysterectomy      Mammo-11/22/14-BIRADS Category 2: Benign    [facility]- 06/03/11-Dr. Olevia Perches     Objective:    Today's Vitals   08/18/15 1031  BP: 120/82  Pulse: 100  Height: 5\' 4"  (1.626 m)  Weight: 169 lb 12.8 oz (77.021 kg)  SpO2: 97%  PainSc: 0-No pain    Current Medications (verified) Outpatient Encounter Prescriptions as of 08/18/2015  Medication Sig  . albuterol (PROAIR HFA) 108 (90 BASE) MCG/ACT inhaler Inhale 2 puffs into the lungs every 6 (six) hours as needed for wheezing.  . calcitRIOL (ROCALTROL) 0.25 MCG capsule Take 0.25 mcg by mouth every other day. Mon, Wed, Fri  . cholestyramine Lucrezia Starch) 4 G packet Take 1 packet (4 g total) by mouth daily.  . CYMBALTA 60 MG capsule TAKE 2 TABLETS BY MOUTH ONCE  DAILY  . febuxostat (ULORIC) 40 MG tablet Take 1 tablet (40 mg total) by mouth daily.  . Fluticasone-Salmeterol (ADVAIR) 250-50 MCG/DOSE AEPB Inhale 1 puff into the lungs 2 (two) times daily as needed (for wheezing or shortness of breath).   Marland Kitchen HYDROcodone-acetaminophen (NORCO/VICODIN) 5-325 MG per tablet Take 2 tablets by mouth every 4 (four) hours as needed for moderate pain or severe pain.  . mesalamine (CANASA) 1000 MG suppository Place 1 suppository (1,000 mg total) rectally at bedtime.  . metoprolol succinate (TOPROL-XL) 100 MG 24 hr  tablet TAKE 1/2 TABLET EVERY DAY  . metroNIDAZOLE (FLAGYL) 500 MG tablet Take 1 tablet (500 mg total) by mouth 3 (three) times daily.  . temazepam (RESTORIL) 30 MG capsule TAKE ONE CAPSULE BY MOUTH AT BEDTIME  . Multiple Vitamins-Minerals (MULTIVITAL) CHEW Chew by mouth.  . ranitidine (ZANTAC) 150 MG tablet Take one po daily (Patient not taking: Reported on 08/18/2015)  . [DISCONTINUED] Adalimumab (HUMIRA PEN) 40 MG/0.8ML PNKT Inject 40 mg into the skin every 14 (fourteen) days. (Patient not taking: Reported on 08/18/2015)  . [DISCONTINUED] budesonide (ENTOCORT EC) 3 MG 24 hr capsule Take 3 capsules daily x 4 weeks, then 2 capsules daily x 4 weeks, then one capsule daily x 4 weeks (Patient not taking: Reported on 08/18/2015)   No facility-administered encounter medications on file as of 08/18/2015.    Allergies (verified) Morphine and Sulfonamide derivatives   History: Past Medical History  Diagnosis Date  . History of small bowel obstruction   . Obesity   . Hyperparathyroidism   . Hypokalemia   . Hypertension   . Gastric polyp   . GERD (gastroesophageal reflux disease)   . Depression   . Anxiety   . Anemia   . Renal insufficiency   . Ulcerative colitis   . RLS (restless legs syndrome)   . Pulmonary nodule, right     right upper lobe  . Anal stenosis   .  Esophagitis   . De Quervain's tenosynovitis   . Hyperlipidemia   . Fibroid   . History of cervical dysplasia   . Asthma   . Hearing loss   . Visual disturbance   . Abdominal pain   . Rectal bleeding   . Blood in stool   . Rectal pain   . Difficulty urinating   . Blood in urine   . Fainting     due to dehydration  . Weakness generalized   . Fatigue   . Easy bruising   . Hemorrhoids, internal   . Hemorrhoids, external   . Ovarian cyst   . Herpes, genital     vaginal treated 07/05/12 and pt states is resolved   Past Surgical History  Procedure Laterality Date  . Anal dilation    . Restorative proctocolectomy       with insertion of ileoanal J Pouch with loop ileostomy  . Ileostomy closure    . Cholecystectomy    . Tubal ligation    . Hemorrhoid surgery    . Fatty tumor removed from back    . Total abdominal hysterectomy  1998    TAH/LSO  . Cervical biopsy  w/ loop electrode excision    . Upper gastrointestinal endoscopy     Family History  Problem Relation Age of Onset  . Heart disease Father   . Ulcerative colitis Father   . Hypertension Father   . Ulcerative colitis Daughter   . Irritable bowel syndrome      grandchildren  . Colon cancer Neg Hx   . Hypertension Mother   . Heart disease Mother   . Diabetes Sister   . Cancer Sister     uterine  . Cancer Maternal Uncle     LUNG   Social History   Occupational History  . Not on file.   Social History Main Topics  . Smoking status: Former Smoker    Types: Cigarettes    Quit date: 05/21/1979  . Smokeless tobacco: Never Used     Comment: quit age 3  . Alcohol Use: No  . Drug Use: No  . Sexual Activity: Yes    Tobacco Counseling Counseling given: No   Activities of Daily Living In your present state of health, do you have any difficulty performing the following activities: 08/18/2015  Hearing? N  Vision? N  Difficulty concentrating or making decisions? N  Walking or climbing stairs? N  Dressing or bathing? N  Doing errands, shopping? N  Preparing Food and eating ? N  Using the Toilet? N  In the past six months, have you accidently leaked urine? N  Do you have problems with loss of bowel control? Y  Managing your Medications? N  Managing your Finances? N  Housekeeping or managing your Housekeeping? N    Immunizations and Health Maintenance Immunization History  Administered Date(s) Administered  . Influenza Split 08/03/2011  . Influenza Whole 08/15/2007, 07/05/2008, 07/29/2009, 08/19/2010, 08/19/2012  . Influenza,inj,Quad PF,36+ Mos 07/23/2013, 06/12/2014, 06/02/2015  . PPD Test 06/07/2011, 06/13/2012,  09/14/2013  . Pneumococcal Polysaccharide-23 07/23/2013  . Tdap 09/13/2011   Health Maintenance Due  Topic Date Due  . Hepatitis C Screening  10-17-1955  . PAP SMEAR  06/07/2015    Patient Care Team: Rosalita Chessman, DO as PCP - General Donato Heinz, MD (Nephrology) Manus Gunning, MD as Consulting Physician (Gastroenterology) Terrance Mass, MD as Consulting Physician (Gynecology) Rutherford Guys, MD as Consulting Physician (Ophthalmology) Thayer Headings, MD  as Consulting Physician (Cardiology)  See a dermatologist-[facility]---unsure of name  Indicate any recent Medical Services you may have received from other than [facility] providers in the past year (date may be approximate).     Assessment:   This is a routine wellness examination for [facility].   Hearing/Vision screen  Hearing Screening   125Hz  250Hz  500Hz  1000Hz  2000Hz  4000Hz  8000Hz   Right ear:   Pass Pass Pass Fail   Left ear:   Pass Pass Pass Fail   Comments: Slight decrease in hearing.   Vision Screening Comments: No changes in vision. Last eye exam:  Couple of months ago---Dr. Loran Senters.    Dietary issues and exercise activities discussed: Current Exercise Habits:: The patient does not participate in regular exercise at present   Diet:   Describes self as a "grazer."  Breakfast-golden graham cereal, lunch-blueberry muffin, dinner- chicken noodle soup and grill cheese sandwich; snack-3 oatmeal cookies.    Goals    . Lose 20 lbs by next year.       Increase physical activity-walking       Depression Screen PHQ 2/9 Scores 08/18/2015  PHQ - 2 Score 0    Fall Risk Fall Risk  08/18/2015  Falls in the past year? Yes  Number falls in past yr: 2 or more  Injury with Fall? No  Risk Factor Category  High Fall Risk  Risk for fall due to : Impaired balance/gait  Follow up Education provided;Falls prevention discussed    Cognitive Function: AD8 screening completed:  2/8   Screening Tests Health  Maintenance  Topic Date Due  . Hepatitis C Screening  July 12, 1956  . PAP SMEAR  06/07/2015  . HIV Screening  12/26/2015 (Originally 12/14/1970)  . MAMMOGRAM  11/22/2015  . INFLUENZA VACCINE  04/06/2016  . COLONOSCOPY  06/02/2016  . TETANUS/TDAP  09/12/2021      Plan:  Continue to eat heart healthy diet (full of fruits, vegetables, lean protein, water--limit salt, fat, and sugar intake) and increase physical activity as tolerated---walking.    Continue doing brain stimulating activities (puzzles, reading, adult coloring books, staying active) to keep memory sharp.   Complete colonoscopy as scheduled.    Follow up with Dr. Etter Sjogren as scheduled.    During the course of the visit, Nadean was educated and counseled about the following appropriate screening and preventive services:   Vaccines to include Pneumoccal, Influenza, Hepatitis B, Td, Zostavax, HCV  Electrocardiogram  Cardiovascular disease screening  Colorectal cancer screening  Bone density screening  Diabetes screening  Glaucoma screening  Mammography/PAP  Nutrition counseling  Smoking cessation counseling  Patient Instructions (the written plan) were given to the patient.    Rudene Anda, RN   08/19/2015

## 2015-08-18 NOTE — Progress Notes (Signed)
Pre visit review using our clinic review tool, if applicable. No additional management support is needed unless otherwise documented below in the visit note. 

## 2015-08-18 NOTE — Patient Instructions (Addendum)
Continue to eat heart healthy diet (full of fruits, vegetables, lean protein, water--limit salt, fat, and sugar intake) and increase physical activity as tolerated---walking.    Continue doing brain stimulating activities (puzzles, reading, adult coloring books, staying active) to keep memory sharp.   Complete colonoscopy as scheduled.    Follow up with Dr. Etter Sjogren as scheduled.     Fat and Cholesterol Restricted Diet High levels of fat and cholesterol in your blood may lead to various health problems, such as diseases of the heart, blood vessels, gallbladder, liver, and pancreas. Fats are concentrated sources of energy that come in various forms. Certain types of fat, including saturated fat, may be harmful in excess. Cholesterol is a substance needed by your body in small amounts. Your body makes all the cholesterol it needs. Excess cholesterol comes from the food you eat. When you have high levels of cholesterol and saturated fat in your blood, health problems can develop because the excess fat and cholesterol will gather along the walls of your blood vessels, causing them to narrow. Choosing the right foods will help you control your intake of fat and cholesterol. This will help keep the levels of these substances in your blood within normal limits and reduce your risk of disease. WHAT IS MY PLAN? Your health care provider recommends that you:  Get no more than __________ % of the total calories in your daily diet from fat.  Limit your intake of saturated fat to less than ______% of your total calories each day.  Limit the amount of cholesterol in your diet to less than _________mg per day. WHAT TYPES OF FAT SHOULD I CHOOSE?  Choose healthy fats more often. Choose monounsaturated and polyunsaturated fats, such as olive and canola oil, flaxseeds, walnuts, almonds, and seeds.  Eat more omega-3 fats. Good choices include salmon, mackerel, sardines, tuna, flaxseed oil, and ground flaxseeds.  Aim to eat fish at least two times a week.  Limit saturated fats. Saturated fats are primarily found in animal products, such as meats, butter, and cream. Plant sources of saturated fats include palm oil, palm kernel oil, and coconut oil.  Avoid foods with partially hydrogenated oils in them. These contain trans fats. Examples of foods that contain trans fats are stick margarine, some tub margarines, cookies, crackers, and other baked goods. WHAT GENERAL GUIDELINES DO I NEED TO FOLLOW? These guidelines for healthy eating will help you control your intake of fat and cholesterol:  Check food labels carefully to identify foods with trans fats or high amounts of saturated fat.  Fill one half of your plate with vegetables and green salads.  Fill one fourth of your plate with whole grains. Look for the word "whole" as the first word in the ingredient list.  Fill one fourth of your plate with lean protein foods.  Limit fruit to two servings a day. Choose fruit instead of juice.  Eat more foods that contain soluble fiber. Examples of foods that contain this type of fiber are apples, broccoli, carrots, beans, peas, and barley. Aim to get 20-30 g of fiber per day.  Eat more home-cooked food and less restaurant, buffet, and fast food.  Limit or avoid alcohol.  Limit foods high in starch and sugar.  Limit fried foods.  Cook foods using methods other than frying. Baking, boiling, grilling, and broiling are all great options.  Lose weight if you are overweight. Losing just 5-10% of your initial body weight can help your overall health and prevent diseases such  as diabetes and heart disease. WHAT FOODS CAN I EAT? Grains Whole grains, such as whole wheat or whole grain breads, crackers, cereals, and pasta. Unsweetened oatmeal, bulgur, barley, quinoa, or brown rice. Corn or whole wheat flour tortillas. Vegetables Fresh or frozen vegetables (raw, steamed, roasted, or grilled). Green  salads. Fruits All fresh, canned (in natural juice), or frozen fruits. Meat and Other Protein Products Ground beef (85% or leaner), grass-fed beef, or beef trimmed of fat. Skinless chicken or [place]. Ground chicken or [place]. Pork trimmed of fat. All fish and seafood. Eggs. Dried beans, peas, or lentils. Unsalted nuts or seeds. Unsalted canned or dry beans. Dairy Low-fat dairy products, such as skim or 1% milk, 2% or reduced-fat cheeses, low-fat ricotta or cottage cheese, or plain low-fat yogurt. Fats and Oils Tub margarines without trans fats. Light or reduced-fat mayonnaise and salad dressings. Avocado. Olive, canola, sesame, or safflower oils. Natural peanut or almond butter (choose ones without added sugar and oil). The items listed above may not be a complete list of recommended foods or beverages. Contact your dietitian for more options. WHAT FOODS ARE NOT RECOMMENDED? Grains White bread. White pasta. White rice. Cornbread. Bagels, pastries, and croissants. Crackers that contain trans fat. Vegetables White potatoes. Corn. Creamed or fried vegetables. Vegetables in a cheese sauce. Fruits Dried fruits. Canned fruit in light or heavy syrup. Fruit juice. Meat and Other Protein Products Fatty cuts of meat. Ribs, chicken wings, bacon, sausage, bologna, salami, chitterlings, fatback, hot dogs, bratwurst, and packaged luncheon meats. Liver and organ meats. Dairy Whole or 2% milk, cream, half-and-half, and cream cheese. Whole milk cheeses. Whole-fat or sweetened yogurt. Full-fat cheeses. Nondairy creamers and whipped toppings. Processed cheese, cheese spreads, or cheese curds. Sweets and Desserts Corn syrup, sugars, honey, and molasses. Candy. Jam and jelly. Syrup. Sweetened cereals. Cookies, pies, cakes, donuts, muffins, and ice cream. Fats and Oils Butter, stick margarine, lard, shortening, ghee, or bacon fat. Coconut, palm kernel, or palm oils. Beverages Alcohol. Sweetened drinks (such as  sodas, lemonade, and fruit drinks or punches). The items listed above may not be a complete list of foods and beverages to avoid. Contact your dietitian for more information.   This information is not intended to replace advice given to you by your health care provider. Make sure you discuss any questions you have with your health care provider.   Document Released: 08/23/2005 Document Revised: 09/13/2014 Document Reviewed: 11/21/2013 Elsevier Interactive Patient Education 2016 [employer]  Fat and Cholesterol Restricted Diet Getting too much fat and cholesterol in your diet may cause health problems. Following this diet helps keep your fat and cholesterol at normal levels. This can keep you from getting sick. WHAT TYPES OF FAT SHOULD I CHOOSE?  Choose monosaturated and polyunsaturated fats. These are found in foods such as olive oil, canola oil, flaxseeds, walnuts, almonds, and seeds.  Eat more omega-3 fats. Good choices include salmon, mackerel, sardines, tuna, flaxseed oil, and ground flaxseeds.  Limit saturated fats. These are in animal products such as meats, butter, and cream. They can also be in plant products such as palm oil, palm kernel oil, and coconut oil.   Avoid foods with partially hydrogenated oils in them. These contain trans fats. Examples of foods that have trans fats are stick margarine, some tub margarines, cookies, crackers, and other baked goods. WHAT GENERAL GUIDELINES DO I NEED TO FOLLOW?   Check food labels. Look for the words "trans fat" and "saturated fat."  When preparing a meal:  Fill  half of your plate with vegetables and green salads.  Fill one fourth of your plate with whole grains. Look for the word "whole" as the first word in the ingredient list.  Fill one fourth of your plate with lean protein foods.  Limit fruit to two servings a day. Choose fruit instead of juice.  Eat more foods with soluble fiber. Examples of foods with this type of fiber  are apples, broccoli, carrots, beans, peas, and barley. Try to get 20-30 g (grams) of fiber per day.  Eat more home-cooked foods. Eat less at restaurants and buffets.  Limit or avoid alcohol.  Limit foods high in starch and sugar.  Limit fried foods.  Cook foods without frying them. Baking, boiling, grilling, and broiling are all great options.  Lose weight if you are overweight. Losing even a small amount of weight can help your overall health. It can also help prevent diseases such as diabetes and heart disease. WHAT FOODS CAN I EAT? Grains Whole grains, such as whole wheat or whole grain breads, crackers, cereals, and pasta. Unsweetened oatmeal, bulgur, barley, quinoa, or brown rice. Corn or whole wheat flour tortillas. Vegetables Fresh or frozen vegetables (raw, steamed, roasted, or grilled). Green salads. Fruits All fresh, canned (in natural juice), or frozen fruits. Meat and Other Protein Products Ground beef (85% or leaner), grass-fed beef, or beef trimmed of fat. Skinless chicken or [place]. Ground chicken or [place]. Pork trimmed of fat. All fish and seafood. Eggs. Dried beans, peas, or lentils. Unsalted nuts or seeds. Unsalted canned or dry beans. Dairy Low-fat dairy products, such as skim or 1% milk, 2% or reduced-fat cheeses, low-fat ricotta or cottage cheese, or plain low-fat yogurt. Fats and Oils Tub margarines without trans fats. Light or reduced-fat mayonnaise and salad dressings. Avocado. Olive, canola, sesame, or safflower oils. Natural peanut or almond butter (choose ones without added sugar and oil). The items listed above may not be a complete list of recommended foods or beverages. Contact your dietitian for more options. WHAT FOODS ARE NOT RECOMMENDED? Grains White bread. White pasta. White rice. Cornbread. Bagels, pastries, and croissants. Crackers that contain trans fat. Vegetables White potatoes. Corn. Creamed or fried vegetables. Vegetables in a cheese  sauce. Fruits Dried fruits. Canned fruit in light or heavy syrup. Fruit juice. Meat and Other Protein Products Fatty cuts of meat. Ribs, chicken wings, bacon, sausage, bologna, salami, chitterlings, fatback, hot dogs, bratwurst, and packaged luncheon meats. Liver and organ meats. Dairy Whole or 2% milk, cream, half-and-half, and cream cheese. Whole milk cheeses. Whole-fat or sweetened yogurt. Full-fat cheeses. Nondairy creamers and whipped toppings. Processed cheese, cheese spreads, or cheese curds. Sweets and Desserts Corn syrup, sugars, honey, and molasses. Candy. Jam and jelly. Syrup. Sweetened cereals. Cookies, pies, cakes, donuts, muffins, and ice cream. Fats and Oils Butter, stick margarine, lard, shortening, ghee, or bacon fat. Coconut, palm kernel, or palm oils. Beverages Alcohol. Sweetened drinks (such as sodas, lemonade, and fruit drinks or punches). The items listed above may not be a complete list of foods and beverages to avoid. Contact your dietitian for more information.   This information is not intended to replace advice given to you by your health care provider. Make sure you discuss any questions you have with your health care provider.   Document Released: 02/22/2012 Document Revised: 09/13/2014 Document Reviewed: 11/22/2013 Elsevier Interactive Patient Education 2016 [place] Maintenance, Female Adopting a healthy lifestyle and getting preventive care can go a long way to promote health  and wellness. Talk with your health care provider about what schedule of regular examinations is right for you. This is a good chance for you to check in with your provider about disease prevention and staying healthy. In between checkups, there are plenty of things you can do on your own. Experts have done a lot of research about which lifestyle changes and preventive measures are most likely to keep you healthy. Ask your health care provider for more information. WEIGHT AND  DIET  Eat a healthy diet  Be sure to include plenty of vegetables, fruits, low-fat dairy products, and lean protein.  Do not eat a lot of foods high in solid fats, added sugars, or salt.  Get regular exercise. This is one of the most important things you can do for your health.  Most adults should exercise for at least 150 minutes each week. The exercise should increase your heart rate and make you sweat (moderate-intensity exercise).  Most adults should also do strengthening exercises at least twice a week. This is in addition to the moderate-intensity exercise.  Maintain a healthy weight  Body mass index (BMI) is a measurement that can be used to identify possible weight problems. It estimates body fat based on height and weight. Your health care provider can help determine your BMI and help you achieve or maintain a healthy weight.  For females 6 years of age and older:   A BMI below 18.5 is considered underweight.  A BMI of 18.5 to 24.9 is normal.  A BMI of 25 to 29.9 is considered overweight.  A BMI of 30 and above is considered obese.  Watch levels of cholesterol and blood lipids  You should start having your blood tested for lipids and cholesterol at 61 years of age, then have this test every 5 years.  You may need to have your cholesterol levels checked more often if:  Your lipid or cholesterol levels are high.  You are older than 61 years of age.  You are at high risk for heart disease.  CANCER SCREENING   Lung Cancer  Lung cancer screening is recommended for adults 94-66 years old who are at high risk for lung cancer because of a history of smoking.  A yearly low-dose CT scan of the lungs is recommended for people who:  Currently smoke.  Have quit within the past 15 years.  Have at least a 30-pack-year history of smoking. A pack year is smoking an average of one pack of cigarettes a day for 1 year.  Yearly screening should continue until it has been  15 years since you quit.  Yearly screening should stop if you develop a health problem that would prevent you from having lung cancer treatment.  Breast Cancer  Practice breast self-awareness. This means understanding how your breasts normally appear and feel.  It also means doing regular breast self-exams. Let your health care provider know about any changes, no matter how small.  If you are in your 20s or 30s, you should have a clinical breast exam (CBE) by a health care provider every 1-3 years as part of a regular health exam.  If you are 73 or older, have a CBE every year. Also consider having a breast X-ray (mammogram) every year.  If you have a family history of breast cancer, talk to your health care provider about genetic screening.  If you are at high risk for breast cancer, talk to your health care provider about having an MRI and  a mammogram every year.  Breast cancer gene (BRCA) assessment is recommended for women who have family members with BRCA-related cancers. BRCA-related cancers include:  Breast.  Ovarian.  Tubal.  Peritoneal cancers.  Results of the assessment will determine the need for genetic counseling and BRCA1 and BRCA2 testing. Cervical Cancer Your health care provider may recommend that you be screened regularly for cancer of the pelvic organs (ovaries, uterus, and vagina). This screening involves a pelvic examination, including checking for microscopic changes to the surface of your cervix (Pap test). You may be encouraged to have this screening done every 3 years, beginning at age 9.  For women ages 20-65, health care providers may recommend pelvic exams and Pap testing every 3 years, or they may recommend the Pap and pelvic exam, combined with testing for human papilloma virus (HPV), every 5 years. Some types of HPV increase your risk of cervical cancer. Testing for HPV may also be done on women of any age with unclear Pap test results.  Other health  care providers may not recommend any screening for nonpregnant women who are considered low risk for pelvic cancer and who do not have symptoms. Ask your health care provider if a screening pelvic exam is right for you.  If you have had past treatment for cervical cancer or a condition that could lead to cancer, you need Pap tests and screening for cancer for at least 20 years after your treatment. If Pap tests have been discontinued, your risk factors (such as having a new sexual partner) need to be reassessed to determine if screening should resume. Some women have medical problems that increase the chance of getting cervical cancer. In these cases, your health care provider may recommend more frequent screening and Pap tests. Colorectal Cancer  This type of cancer can be detected and often prevented.  Routine colorectal cancer screening usually begins at 61 years of age and continues through 61 years of age.  Your health care provider may recommend screening at an earlier age if you have risk factors for colon cancer.  Your health care provider may also recommend using home test kits to check for hidden blood in the stool.  A small camera at the end of a tube can be used to examine your colon directly (sigmoidoscopy or colonoscopy). This is done to check for the earliest forms of colorectal cancer.  Routine screening usually begins at age 1.  Direct examination of the colon should be repeated every 5-10 years through 61 years of age. However, you may need to be screened more often if early forms of precancerous polyps or small growths are found. Skin Cancer  Check your skin from head to toe regularly.  Tell your health care provider about any new moles or changes in moles, especially if there is a change in a mole's shape or color.  Also tell your health care provider if you have a mole that is larger than the size of a pencil eraser.  Always use sunscreen. Apply sunscreen liberally and  repeatedly throughout the day.  Protect yourself by wearing long sleeves, pants, a wide-brimmed hat, and sunglasses whenever you are outside. HEART DISEASE, DIABETES, AND HIGH BLOOD PRESSURE   High blood pressure causes heart disease and increases the risk of stroke. High blood pressure is more likely to develop in:  People who have blood pressure in the high end of the normal range (130-139/85-89 mm Hg).  People who are overweight or obese.  People who are  African American.  If you are 67-35 years of age, have your blood pressure checked every 3-5 years. If you are 26 years of age or older, have your blood pressure checked every year. You should have your blood pressure measured twice--once when you are at a hospital or clinic, and once when you are not at a hospital or clinic. Record the average of the two measurements. To check your blood pressure when you are not at a hospital or clinic, you can use:  An automated blood pressure machine at a pharmacy.  A home blood pressure monitor.  If you are between 43 years and 70 years old, ask your health care provider if you should take aspirin to prevent strokes.  Have regular diabetes screenings. This involves taking a blood sample to check your fasting blood sugar level.  If you are at a normal weight and have a low risk for diabetes, have this test once every three years after 61 years of age.  If you are overweight and have a high risk for diabetes, consider being tested at a younger age or more often. PREVENTING INFECTION  Hepatitis B  If you have a higher risk for hepatitis B, you should be screened for this virus. You are considered at high risk for hepatitis B if:  You were born in a country where hepatitis B is common. Ask your health care provider which countries are considered high risk.  Your parents were born in a high-risk country, and you have not been immunized against hepatitis B (hepatitis B vaccine).  You have HIV or  AIDS.  You use needles to inject street drugs.  You live with someone who has hepatitis B.  You have had sex with someone who has hepatitis B.  You get hemodialysis treatment.  You take certain medicines for conditions, including cancer, organ transplantation, and autoimmune conditions. Hepatitis C  Blood testing is recommended for:  Everyone born from [birth year] through 1967.  Anyone with known risk factors for hepatitis C. Sexually transmitted infections (STIs)  You should be screened for sexually transmitted infections (STIs) including gonorrhea and chlamydia if:  You are sexually active and are younger than 61 years of age.  You are older than 61 years of age and your health care provider tells you that you are at risk for this type of infection.  Your sexual activity has changed since you were last screened and you are at an increased risk for chlamydia or gonorrhea. Ask your health care provider if you are at risk.  If you do not have HIV, but are at risk, it may be recommended that you take a prescription medicine daily to prevent HIV infection. This is called pre-exposure prophylaxis (PrEP). You are considered at risk if:  You are sexually active and do not regularly use condoms or know the HIV status of your partner(s).  You take drugs by injection.  You are sexually active with a partner who has HIV. Talk with your health care provider about whether you are at high risk of being infected with HIV. If you choose to begin PrEP, you should first be tested for HIV. You should then be tested every 3 months for as long as you are taking PrEP.  PREGNANCY   If you are premenopausal and you may become pregnant, ask your health care provider about preconception counseling.  If you may become pregnant, take 400 to 800 micrograms (mcg) of folic acid every day.  If you want to prevent  pregnancy, talk to your health care provider about birth control (contraception). OSTEOPOROSIS AND  MENOPAUSE   Osteoporosis is a disease in which the bones lose minerals and strength with aging. This can result in serious bone fractures. Your risk for osteoporosis can be identified using a bone density scan.  If you are 71 years of age or older, or if you are at risk for osteoporosis and fractures, ask your health care provider if you should be screened.  Ask your health care provider whether you should take a calcium or vitamin D supplement to lower your risk for osteoporosis.  Menopause may have certain physical symptoms and risks.  Hormone replacement therapy may reduce some of these symptoms and risks. Talk to your health care provider about whether hormone replacement therapy is right for you.  HOME CARE INSTRUCTIONS   Schedule regular health, dental, and eye exams.  Stay current with your immunizations.   Do not use any tobacco products including cigarettes, chewing tobacco, or electronic cigarettes.  If you are pregnant, do not drink alcohol.  If you are breastfeeding, limit how much and how often you drink alcohol.  Limit alcohol intake to no more than 1 drink per day for nonpregnant women. One drink equals 12 ounces of beer, 5 ounces of wine, or 1 ounces of hard liquor.  Do not use street drugs.  Do not share needles.  Ask your health care provider for help if you need support or information about quitting drugs.  Tell your health care provider if you often feel depressed.  Tell your health care provider if you have ever been abused or do not feel safe at home.   This information is not intended to replace advice given to you by your health care provider. Make sure you discuss any questions you have with your health care provider.   Document Released: 03/08/2011 Document Revised: 09/13/2014 Document Reviewed: 07/25/2013 Elsevier Interactive Patient Education [employer].

## 2015-08-19 ENCOUNTER — Telehealth: Payer: Self-pay | Admitting: Gastroenterology

## 2015-08-19 NOTE — Telephone Encounter (Signed)
Spoke with patient and she states the Flagyl is making her have nausea. Vomited one time. She also states she is constipated now. Had a small BM today with lots of straining. She should complete Flagyl on Friday. Please, advise.

## 2015-08-19 NOTE — Telephone Encounter (Signed)
Can we give her some zofran to help with the nausea, as she is almost done with the course. If it doesn't help tell her to call back for reassessment. Sounds as if her diarrhea is improved. She can try a fiber supplement or miralax. Thanks

## 2015-08-20 MED ORDER — ONDANSETRON HCL 4 MG PO TABS: 4.0000 mg | ORAL_TABLET | Freq: Three times a day (TID) | ORAL | Status: DC | PRN

## 2015-08-20 NOTE — Telephone Encounter (Signed)
rx sent to pharmacy. Patient notified of recommendations.

## 2015-08-29 ENCOUNTER — Encounter: Payer: Self-pay | Admitting: Gastroenterology

## 2015-08-29 ENCOUNTER — Ambulatory Visit (AMBULATORY_SURGERY_CENTER): Payer: Medicare Other | Admitting: Gastroenterology

## 2015-08-29 VITALS — BP 133/86 | HR 85 | Temp 97.8°F | Resp 17 | Ht 64.0 in | Wt 169.0 lb

## 2015-08-29 DIAGNOSIS — K91850 Pouchitis: Secondary | ICD-10-CM | POA: Diagnosis not present

## 2015-08-29 DIAGNOSIS — K6389 Other specified diseases of intestine: Secondary | ICD-10-CM | POA: Diagnosis present

## 2015-08-29 DIAGNOSIS — I10 Essential (primary) hypertension: Secondary | ICD-10-CM | POA: Diagnosis not present

## 2015-08-29 DIAGNOSIS — K529 Noninfective gastroenteritis and colitis, unspecified: Secondary | ICD-10-CM

## 2015-08-29 DIAGNOSIS — K9189 Other postprocedural complications and disorders of digestive system: Secondary | ICD-10-CM | POA: Diagnosis not present

## 2015-08-29 DIAGNOSIS — K5190 Ulcerative colitis, unspecified, without complications: Secondary | ICD-10-CM | POA: Diagnosis not present

## 2015-08-29 MED ORDER — VSL#3 PO CAPS: ORAL_CAPSULE | ORAL | Status: DC

## 2015-08-29 MED ORDER — SODIUM CHLORIDE 0.9 % IV SOLN: 500.0000 mL | INTRAVENOUS | Status: DC

## 2015-08-29 NOTE — Progress Notes (Signed)
Called to room to assist during endoscopic procedure.  Patient ID and intended procedure confirmed with present staff. Received instructions for my participation in the procedure from the performing physician.  

## 2015-08-29 NOTE — Progress Notes (Signed)
Report to PACU, RN, vss, BBS= Clear.  

## 2015-08-29 NOTE — Op Note (Addendum)
[place]  [address] [place], [address]   FLEXIBLE SIGMOIDOSCOPY PROCEDURE REPORT  PATIENT: Jeanette, [name]  MR#: [id number] BIRTHDATE: July 27, 1956 , 59  yrs. old GENDER: female ENDOSCOPIST: Yetta Flock, MD REFERRED BY: PROCEDURE DATE:  08/29/2015 PROCEDURE:   Sigmoidoscopy with biopsy ASA CLASS:   Class III INDICATIONS:follow up for previously diagnosed colitis s/p IPAA, now with chronic diarrea. Recently treated for C diff with improvement in symptoms. Stopped Humira due to development of antibodies. MEDICATIONS: Propofol 200 mg IV  DESCRIPTION OF PROCEDURE:   After the risks benefits and alternatives of the procedure were thoroughly explained, informed consent was obtained.     The LB [id number]  endoscope was introduced through the anus  and advanced to the surgical anastomosis , The exam was Without limitations.    The quality of the prep was The overall prep quality was adequate. . Estimated blood loss is zero unless otherwise noted in this procedure report. The instrument was then slowly withdrawn as the mucosa was fully examined.      COLON FINDINGS: There was an anal canal stenosis easily traversed with the endoscope.  The ileal pouch had patchy areas of ulceration, with areas of normal mucosa throughout.  The majority of the pouch appeared normal with the ulcerations appearing mostly at an angulated turn upon entiering the pouch and at the anastomosis itself.  Biopsies taken of the anastomosis and the pouch to rule out Crohn's, CMV, chronic pouchitis, vs.  ischemia. The ileal limb appeared entirely normal.    Retroflexion was not performed due to a narrow rectal vault.  Perianal exam remarkable for perianal rash and small fissure.  The scope was then withdrawn from the patient and the procedure terminated.  COMPLICATIONS: There were no immediate complications.     ENDOSCOPIC IMPRESSION: Anal canal stenosis Focal areas of  deep ulceration of the pouch surrounded my normal appearing mucosa in the pouch. Areas of ulceration appeared at an angulated turn of the pouch and at the surgical anastmosis. Biopsies taken. Differential for ulcerations includes Crohn's of the pouch, chronic pouchitis, CMV, or ischemic changes. The ileal limb appeared entirely normal. No evidence of C Diff at this time   RECOMMENDATIONS: Await pathology results Resume diet Resume medications Start VSL#3 if you have not yet started this    eSigned:  Yetta Flock, MD 08/29/2015 10:48 AM Revised: 08/29/2015 10:48 AM  CC: the patient  PATIENT NAME:  Jeanette, [name] MR#: [id number]

## 2015-08-29 NOTE — Patient Instructions (Signed)
YOU HAD AN ENDOSCOPIC PROCEDURE TODAY AT THE [facility]:   Refer to the procedure report that was given to you for any specific questions about what was found during the examination.  If the procedure report does not answer your questions, please call your gastroenterologist to clarify.  If you requested that your care partner not be given the details of your procedure findings, then the procedure report has been included in a sealed envelope for you to review at your convenience later.  YOU SHOULD EXPECT: Some feelings of bloating in the abdomen. Passage of more gas than usual.  Walking can help get rid of the air that was put into your GI tract during the procedure and reduce the bloating. If you had a lower endoscopy (such as a colonoscopy or flexible sigmoidoscopy) you may notice spotting of blood in your stool or on the toilet paper. If you underwent a bowel prep for your procedure, you may not have a normal bowel movement for a few days.  Please Note:  You might notice some irritation and congestion in your nose or some drainage.  This is from the oxygen used during your procedure.  There is no need for concern and it should clear up in a day or so.  SYMPTOMS TO REPORT IMMEDIATELY:   Following lower endoscopy (colonoscopy or flexible sigmoidoscopy):  Excessive amounts of blood in the stool  Significant tenderness or worsening of abdominal pains  Swelling of the abdomen that is new, acute  Fever of 100F or higher   For urgent or emergent issues, a gastroenterologist can be reached at any hour by calling [phone].   DIET: Your first meal following the procedure should be a small meal and then it is ok to progress to your normal diet. Heavy or fried foods are harder to digest and may make you feel nauseous or bloated.  Likewise, meals heavy in dairy and vegetables can increase bloating.  Drink plenty of fluids but you should avoid alcoholic beverages for 24  hours.  ACTIVITY:  You should plan to take it easy for the rest of today and you should NOT DRIVE or use heavy machinery until tomorrow (because of the sedation medicines used during the test).    FOLLOW UP: Our staff will call the number listed on your records the next business day following your procedure to check on you and address any questions or concerns that you may have regarding the information given to you following your procedure. If we do not reach you, we will leave a message.  However, if you are feeling well and you are not experiencing any problems, there is no need to return our call.  We will assume that you have returned to your regular daily activities without incident.  If any biopsies were taken you will be contacted by phone or by letter within the next 1-3 weeks.  Please call us at [phone] if you have not heard about the biopsies in 3 weeks.    SIGNATURES/CONFIDENTIALITY: You and/or your care partner have signed paperwork which will be entered into your electronic medical record.  These signatures attest to the fact that that the information above on your After Visit Summary has been reviewed and is understood.  Full responsibility of the confidentiality of this discharge information lies with you and/or your care-partner. 

## 2015-09-02 ENCOUNTER — Telehealth: Payer: Self-pay | Admitting: *Deleted

## 2015-09-02 NOTE — Telephone Encounter (Signed)
  Follow up Call-  Call back number 08/29/2015  Post procedure Call Back phone  # [phone]  Permission to leave phone message Yes     Patient questions:  Do you have a fever, pain , or abdominal swelling? No. Pain Score  0 *  Have you tolerated food without any problems? Yes.    Have you been able to return to your normal activities? Yes.    Do you have any questions about your discharge instructions: Diet   No. Medications  No. Follow up visit  No.  Do you have questions or concerns about your Care? No.  Actions: * If pain score is 4 or above: No action needed, pain <4.

## 2015-09-04 ENCOUNTER — Telehealth: Payer: Self-pay | Admitting: Gastroenterology

## 2015-09-04 ENCOUNTER — Other Ambulatory Visit: Payer: Self-pay | Admitting: Family

## 2015-09-04 NOTE — Telephone Encounter (Signed)
I left a detailed message that the biopsy results are not available at this time and that we will call or send a letter when they come back and Dr. Havery Moros has had a chance to review

## 2015-09-04 NOTE — Telephone Encounter (Signed)
Last seen 12/26/14 and filled 08/04/15 #30   Please advise     KP

## 2015-09-04 NOTE — Telephone Encounter (Signed)
Dr Etter Sjogren pt

## 2015-09-05 MED ORDER — TEMAZEPAM 30 MG PO CAPS: 30.0000 mg | ORAL_CAPSULE | Freq: Every day | ORAL | Status: DC

## 2015-09-05 NOTE — Addendum Note (Signed)
Addended by: Ewing Schlein on: 09/05/2015 08:11 AM   Modules accepted: Orders

## 2015-09-12 ENCOUNTER — Other Ambulatory Visit: Payer: Self-pay | Admitting: *Deleted

## 2015-09-12 DIAGNOSIS — K51218 Ulcerative (chronic) proctitis with other complication: Secondary | ICD-10-CM

## 2015-09-12 MED ORDER — MESALAMINE 1000 MG RE SUPP: 1000.0000 mg | Freq: Every day | RECTAL | Status: DC

## 2015-09-12 MED ORDER — RANITIDINE HCL 150 MG PO TABS: ORAL_TABLET | ORAL | Status: DC

## 2015-09-18 ENCOUNTER — Telehealth: Payer: Self-pay | Admitting: *Deleted

## 2015-09-18 NOTE — Telephone Encounter (Signed)
Spoke with [facility] scheduling and scheduled patient on Feb. 16, 17 with Dr. Kem Parkinson [address] [facility] clinic 3-2. Patient notified of appointment date and time.

## 2015-09-30 ENCOUNTER — Telehealth: Payer: Self-pay | Admitting: Family Medicine

## 2015-09-30 DIAGNOSIS — M10071 Idiopathic gout, right ankle and foot: Secondary | ICD-10-CM

## 2015-09-30 MED ORDER — HYDROCODONE-ACETAMINOPHEN 5-325 MG PO TABS: 2.0000 | ORAL_TABLET | ORAL | Status: DC | PRN

## 2015-09-30 NOTE — Telephone Encounter (Signed)
Refill x1 

## 2015-09-30 NOTE — Telephone Encounter (Signed)
Caller name:Shalane ANN Relationship to patient:SELF Can be reached:[phone]  Pharmacy:  Reason for call:WANTS RX FOR HYDROCODONE

## 2015-09-30 NOTE — Telephone Encounter (Signed)
Patient aware Rx is ready for pick up tomorrow.    KP

## 2015-09-30 NOTE — Telephone Encounter (Signed)
Last seen 12/26/14 and filled 05/22/15 #90 UDS 05/26/15 low risk   Please advise    KP

## 2015-10-03 ENCOUNTER — Telehealth: Payer: Self-pay | Admitting: Family Medicine

## 2015-10-03 MED ORDER — TEMAZEPAM 30 MG PO CAPS
30.0000 mg | ORAL_CAPSULE | Freq: Every day | ORAL | Status: DC
Start: 2015-10-03 — End: 2015-11-05

## 2015-10-03 NOTE — Telephone Encounter (Signed)
Refill x1 

## 2015-10-03 NOTE — Telephone Encounter (Signed)
Last seen 12/26/14 and filled 09/05/15 #30 no refills   Please advise    KP

## 2015-10-03 NOTE — Telephone Encounter (Signed)
°  Relation to JS:IDXF Call back [phone] Pharmacy:[facility] [address]  Reason for call: pt is needing rx temazepam (RESTORIL) 30 MG capsule , pt states she has 3 tabs left.

## 2015-10-03 NOTE — Telephone Encounter (Signed)
Rx faxed.    KP 

## 2015-10-23 DIAGNOSIS — Z8619 Personal history of other infectious and parasitic diseases: Secondary | ICD-10-CM | POA: Diagnosis not present

## 2015-10-23 DIAGNOSIS — K529 Noninfective gastroenteritis and colitis, unspecified: Secondary | ICD-10-CM | POA: Diagnosis not present

## 2015-10-23 DIAGNOSIS — K91850 Pouchitis: Secondary | ICD-10-CM | POA: Diagnosis not present

## 2015-10-24 DIAGNOSIS — K529 Noninfective gastroenteritis and colitis, unspecified: Secondary | ICD-10-CM | POA: Insufficient documentation

## 2015-10-24 DIAGNOSIS — K624 Stenosis of anus and rectum: Secondary | ICD-10-CM | POA: Insufficient documentation

## 2015-11-01 ENCOUNTER — Telehealth: Payer: Self-pay | Admitting: Gastroenterology

## 2015-11-01 DIAGNOSIS — R197 Diarrhea, unspecified: Secondary | ICD-10-CM

## 2015-11-01 MED ORDER — METRONIDAZOLE 500 MG PO TABS: 500.0000 mg | ORAL_TABLET | Freq: Three times a day (TID) | ORAL | Status: DC

## 2015-11-01 NOTE — Telephone Encounter (Signed)
Patient called in complaining of diarrhea - she thinks this is c/w prior pouchitis symptoms but she has also had a history of C Diff. I had referred her to [facility] for further evaluation to rule out pouch ischemia and this evaluation is ongoing and she has had C Diff testing again there but awaiting the results. Flagyl has worked for her symptoms in the past and will give her a 14 day course of 544m TID which she has tolerated well in the past. There is no dose adjustment for her renal disease. [facility] [place] [facility], she will call me if no improvement.

## 2015-11-05 ENCOUNTER — Telehealth: Payer: Self-pay | Admitting: Family Medicine

## 2015-11-05 MED ORDER — TEMAZEPAM 30 MG PO CAPS
30.0000 mg | ORAL_CAPSULE | Freq: Every day | ORAL | Status: DC
Start: 2015-11-05 — End: 2015-12-09

## 2015-11-05 NOTE — Telephone Encounter (Signed)
Patient has been made aware and voiced understanding.     KP 

## 2015-11-05 NOTE — Telephone Encounter (Signed)
Refill x1 

## 2015-11-05 NOTE — Telephone Encounter (Signed)
Last seen 12/26/14 and filled 10/03/15 #30  Apt pending for 11/11/15. Please advise    KP

## 2015-11-05 NOTE — Telephone Encounter (Signed)
I have tried to call the pharmacy but something was going on with their phone line, I will have to fax the Rx tomorrow once signed by the MD.     KP

## 2015-11-05 NOTE — Telephone Encounter (Signed)
Relationship to patient: Self   Can be reached: [phone]  Pharmacy: [id number] [place] [place]  Reason for call: refill request on temazepam . Pt says that she is out. She has been having trouble reaching her pharmacy to send request to them.

## 2015-11-07 ENCOUNTER — Other Ambulatory Visit: Payer: Self-pay | Admitting: Cardiovascular Disease

## 2015-11-11 ENCOUNTER — Ambulatory Visit (INDEPENDENT_AMBULATORY_CARE_PROVIDER_SITE_OTHER): Payer: Medicare Other | Admitting: Family Medicine

## 2015-11-11 ENCOUNTER — Encounter: Payer: Self-pay | Admitting: Family Medicine

## 2015-11-11 VITALS — BP 130/76 | HR 106 | Temp 98.7°F | Ht 64.0 in | Wt 170.2 lb

## 2015-11-11 DIAGNOSIS — Z0000 Encounter for general adult medical examination without abnormal findings: Secondary | ICD-10-CM

## 2015-11-11 DIAGNOSIS — E785 Hyperlipidemia, unspecified: Secondary | ICD-10-CM

## 2015-11-11 DIAGNOSIS — M654 Radial styloid tenosynovitis [de Quervain]: Secondary | ICD-10-CM | POA: Diagnosis not present

## 2015-11-11 DIAGNOSIS — Z1159 Encounter for screening for other viral diseases: Secondary | ICD-10-CM

## 2015-11-11 DIAGNOSIS — M7711 Lateral epicondylitis, right elbow: Secondary | ICD-10-CM

## 2015-11-11 DIAGNOSIS — I10 Essential (primary) hypertension: Secondary | ICD-10-CM

## 2015-11-11 DIAGNOSIS — Z114 Encounter for screening for human immunodeficiency virus [HIV]: Secondary | ICD-10-CM

## 2015-11-11 NOTE — Progress Notes (Signed)
Pre visit review using our clinic review tool, if applicable. No additional management support is needed unless otherwise documented below in the visit note. 

## 2015-11-11 NOTE — Progress Notes (Signed)
Subjective:   [name] is a 60 y.o. female who presents for Medicare Annual (Subsequent) preventive examination.  Review of Systems:   Review of Systems  Constitutional: Negative for activity change, appetite change and fatigue.  HENT: Negative for hearing loss, congestion, tinnitus and ear discharge.   Eyes: Negative for visual disturbance (see optho q1y -- vision corrected to 20/20 with glasses).  Respiratory: Negative for cough, chest tightness and shortness of breath.   Cardiovascular: Negative for chest pain, palpitations and leg swelling.  Gastrointestinal: Negative for abdominal pain, , constipation and abdominal distention.  +diarrhea Genitourinary: Negative for urgency, frequency, decreased urine volume and difficulty urinating.  Musculoskeletal: Negative for back pain, arthralgias and gait problem.  Skin: Negative for color change, pallor and rash.  Neurological: Negative for dizziness, light-headedness, numbness and headaches.  Hematological: Negative for adenopathy. Does not bruise/bleed easily.  Psychiatric/Behavioral: Negative for suicidal ideas, confusion, sleep disturbance, self-injury, dysphoric mood, decreased concentration and agitation.  Pt is able to read and write and can do all ADLs No risk for falling No abuse/ violence in home          Objective:     Vitals: BP 130/76 mmHg  Pulse 106  Temp(Src) 98.7 F (37.1 C) (Oral)  Ht 5' 4"  (1.626 m)  Wt 170 lb 3.2 oz (77.202 kg)  BMI 29.20 kg/m2  SpO2 97% BP 130/76 mmHg  Pulse 106  Temp(Src) 98.7 F (37.1 C) (Oral)  Ht 5' 4"  (1.626 m)  Wt 170 lb 3.2 oz (77.202 kg)  BMI 29.20 kg/m2  SpO2 97% General appearance: alert, cooperative, appears stated age and no distress Head: Normocephalic, without obvious abnormality, atraumatic Eyes: conjunctivae/corneas clear. PERRL, EOM's intact. Fundi benign. Ears: normal TM's and external ear canals both ears Nose: Nares normal. Septum midline. Mucosa normal.  No drainage or sinus tenderness. Throat: lips, mucosa, and tongue normal; teeth and gums normal Neck: no adenopathy, no carotid bruit, no JVD, supple, symmetrical, trachea midline and thyroid not enlarged, symmetric, no tenderness/mass/nodules Back: symmetric, no curvature. ROM normal. No CVA tenderness. Lungs: clear to auscultation bilaterally Breasts: normal appearance, no masses or tenderness Heart: regular rate and rhythm, S1, S2 normal, no murmur, click, rub or gallop Abdomen: soft, non-tender; bowel sounds normal; no masses,  no organomegaly Pelvic: deferred Extremities: extremities normal, atraumatic, no cyanosis or edema Pulses: 2+ and symmetric Skin: Skin color, texture, turgor normal. No rashes or lesions Lymph nodes: Cervical, supraclavicular, and axillary nodes normal. Neurologic: Alert and oriented X 3, normal strength and tone. Normal symmetric reflexes. Normal coordination and gait Psych=-- no depression, no anxiety Tobacco History  Smoking status  . Former Smoker  . Types: Cigarettes  . Quit date: 05/21/1979  Smokeless tobacco  . Never Used    Comment: quit age 22     Counseling given: Not Answered   Past Medical History  Diagnosis Date  . History of small bowel obstruction   . Obesity   . Hyperparathyroidism   . Hypokalemia   . Hypertension   . Gastric polyp   . GERD (gastroesophageal reflux disease)   . Depression   . Anxiety   . Anemia   . Renal insufficiency   . Ulcerative colitis   . RLS (restless legs syndrome)   . Pulmonary nodule, right     right upper lobe  . Anal stenosis   . Esophagitis   . De Quervain's tenosynovitis   . Hyperlipidemia   . Fibroid   . History of  cervical dysplasia   . Asthma   . Hearing loss   . Visual disturbance   . Abdominal pain   . Rectal bleeding   . Blood in stool   . Rectal pain   . Difficulty urinating   . Blood in urine   . Fainting     due to dehydration  . Weakness generalized   . Fatigue   . Easy  bruising   . Hemorrhoids, internal   . Hemorrhoids, external   . Ovarian cyst   . Herpes, genital     vaginal treated 07/05/12 and pt states is resolved   Past Surgical History  Procedure Laterality Date  . Anal dilation    . 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 Ileostomy closure    . Cholecystectomy    . Tubal ligation    . Hemorrhoid surgery    . Fatty tumor removed from back    . Total abdominal hysterectomy  1998    TAH/LSO  . Cervical biopsy  w/ loop electrode excision    . Upper gastrointestinal endoscopy     Family History  Problem Relation Age of Onset  . Heart disease Father   . Ulcerative colitis Father   . Hypertension Father   . Ulcerative colitis Daughter   . Irritable bowel syndrome      grandchildren  . Colon cancer Neg Hx   . Hypertension Mother   . Heart disease Mother   . Diabetes Sister   . Cancer Sister     uterine  . Cancer Maternal Uncle     LUNG   History  Sexual Activity  . Sexual Activity: Yes    Outpatient Encounter Prescriptions as of 11/11/2015  Medication Sig  . albuterol (PROAIR HFA) 108 (90 BASE) MCG/ACT inhaler Inhale 2 puffs into the lungs every 6 (six) hours as needed for wheezing.  . calcitRIOL (ROCALTROL) 0.25 MCG capsule Take 0.25 mcg by mouth every other day. Mon, Wed, Fri  . CYMBALTA 60 MG capsule TAKE 2 TABLETS BY MOUTH ONCE  DAILY  . febuxostat (ULORIC) 40 MG tablet Take 1 tablet (40 mg total) by mouth daily.  Marland Kitchen HYDROcodone-acetaminophen (NORCO/VICODIN) 5-325 MG tablet Take 2 tablets by mouth every 4 (four) hours as needed for moderate pain or severe pain.  . mesalamine (CANASA) 1000 MG suppository Place 1 suppository (1,000 mg total) rectally at bedtime.  . metoprolol succinate (TOPROL-XL) 100 MG 24 hr tablet Take 1/2 tablet by mouth daily  . metroNIDAZOLE (FLAGYL) 500 MG tablet Take 1 tablet (500 mg total) by mouth 3 (three) times daily.  . Multiple Vitamins-Minerals  (MULTIVITAL) CHEW Chew by mouth.  . Probiotic Product (VSL#3) CAPS Take 1 tablet daily by mouth  . ranitidine (ZANTAC) 150 MG tablet Take one po BID  . temazepam (RESTORIL) 30 MG capsule Take 1 capsule (30 mg total) by mouth at bedtime.  . [DISCONTINUED] cholestyramine (QUESTRAN) 4 G packet Take 1 packet (4 g total) by mouth daily.  . [DISCONTINUED] Fluticasone-Salmeterol (ADVAIR) 250-50 MCG/DOSE AEPB Inhale 1 puff into the lungs 2 (two) times daily as needed (for wheezing or shortness of breath).   . [DISCONTINUED] ondansetron (ZOFRAN) 4 MG tablet Take 1 tablet (4 mg total) by mouth every 8 (eight) hours as needed for nausea or vomiting.   No facility-administered encounter medications on file as of 11/11/2015.    Activities of Daily Living In your present state of health, do  you have any difficulty performing the following activities: 11/11/2015 08/18/2015  Hearing? N N  Vision? N N  Difficulty concentrating or making decisions? N N  Walking or climbing stairs? N N  Dressing or bathing? N N  Doing errands, shopping? N N  Preparing Food and eating ? - N  Using the Toilet? - N  In the past six months, have you accidently leaked urine? - N  Do you have problems with loss of bowel control? - Y  Managing your Medications? - N  Managing your Finances? - N  Housekeeping or managing your Housekeeping? - N    Patient Care Team: Rosalita Chessman, DO as PCP - General Donato Heinz, MD (Nephrology) Manus Gunning, MD as Consulting Physician (Gastroenterology) Terrance Mass, MD as Consulting Physician (Gynecology) Rutherford Guys, MD as Consulting Physician (Ophthalmology) Thayer Headings, MD as Consulting Physician (Cardiology)    Assessment:    cpe Exercise Activities and Dietary recommendations Current Exercise Habits: The patient does not participate in regular exercise at present, Exercise limited by: None identified  Goals    . Lose 20 lbs by next year.       Increase  physical activity-walking       Fall Risk Fall Risk  11/11/2015 08/18/2015  Falls in the past year? No Yes  Number falls in past yr: - 2 or more  Injury with Fall? - No  Risk Factor Category  - High Fall Risk  Risk for fall due to : - Impaired balance/gait  Follow up - Education provided;Falls prevention discussed   Depression Screen PHQ 2/9 Scores 11/11/2015 08/18/2015  PHQ - 2 Score 0 0     Cognitive Testing mmse 30/30  Immunization History  Administered Date(s) Administered  . Influenza Split 08/03/2011  . Influenza Whole 08/15/2007, 07/05/2008, 07/29/2009, 08/19/2010, 08/19/2012  . Influenza,inj,Quad PF,36+ Mos 07/23/2013, 06/12/2014, 06/02/2015  . PPD Test 06/07/2011, 06/13/2012, 09/14/2013  . Pneumococcal Polysaccharide-23 07/23/2013  . Tdap 09/13/2011   Screening Tests Health Maintenance  Topic Date Due  . Hepatitis C Screening  [birth year]  . HIV Screening  12/26/2015 (Originally 12/14/1970)  . MAMMOGRAM  11/22/2015  . INFLUENZA VACCINE  04/06/2016  . COLONOSCOPY  06/02/2016  . TETANUS/TDAP  09/12/2021      Plan:   see AVS During the course of the visit the patient was educated and counseled about the following appropriate screening and preventive services:   Vaccines to include Pneumoccal, Influenza, Hepatitis B, Td, Zostavax, HCV  Electrocardiogram  Cardiovascular Disease  Colorectal cancer screening  Bone density screening  Diabetes screening  Glaucoma screening  Mammography/PAP  Nutrition counseling   Patient Instructions (the written plan) was given to the patient.  1. Hyperlipidemia On no med Check labs - Comp Met (CMET); Future - Lipid panel; Future  2. Essential hypertension See meds above stable - Comp Met (CMET); Future - CBC with Differential/Platelet; Future  3. Preventative health care See AVS - Comp Met (CMET); Future - CBC with Differential/Platelet; Future - Lipid panel; Future - HIV antibody; Future - POCT  urinalysis dipstick; Future - TSH; Future - Hepatitis C antibody; Future  4. Tennis elbow syndrome, right Tennis elbow strap  5. De Quervain's tenosynovitis, bilateral    6. Routine history and physical examination of adult    7. Encounter for screening for HIV   - HIV antibody; Future  8. Need for hepatitis C screening test   - Hepatitis C antibody; Future  Garnet Koyanagi, DO  11/12/2015

## 2015-11-11 NOTE — Patient Instructions (Signed)
Preventive Care for Adults, Female A healthy lifestyle and preventive care can promote health and wellness. Preventive health guidelines for women include the following key practices.  A routine yearly physical is a good way to check with your health care provider about your health and preventive screening. It is a chance to share any concerns and updates on your health and to receive a thorough exam.  Visit your dentist for a routine exam and preventive care every 6 months. Brush your teeth twice a day and floss once a day. Good oral hygiene prevents tooth decay and gum disease.  The frequency of eye exams is based on your age, health, family medical history, use of contact lenses, and other factors. Follow your health care provider's recommendations for frequency of eye exams.  Eat a healthy diet. Foods like vegetables, fruits, whole grains, low-fat dairy products, and lean protein foods contain the nutrients you need without too many calories. Decrease your intake of foods high in solid fats, added sugars, and salt. Eat the right amount of calories for you.Get information about a proper diet from your health care provider, if necessary.  Regular physical exercise is one of the most important things you can do for your health. Most adults should get at least 150 minutes of moderate-intensity exercise (any activity that increases your heart rate and causes you to sweat) each week. In addition, most adults need muscle-strengthening exercises on 2 or more days a week.  Maintain a healthy weight. The body mass index (BMI) is a screening tool to identify possible weight problems. It provides an estimate of body fat based on height and weight. Your health care provider can find your BMI and can help you achieve or maintain a healthy weight.For adults 20 years and older:  A BMI below 18.5 is considered underweight.  A BMI of 18.5 to 24.9 is normal.  A BMI of 25 to 29.9 is considered overweight.  A  BMI of 30 and above is considered obese.  Maintain normal blood lipids and cholesterol levels by exercising and minimizing your intake of saturated fat. Eat a balanced diet with plenty of fruit and vegetables. Blood tests for lipids and cholesterol should begin at age 45 and be repeated every 5 years. If your lipid or cholesterol levels are high, you are over 50, or you are at high risk for heart disease, you may need your cholesterol levels checked more frequently.Ongoing high lipid and cholesterol levels should be treated with medicines if diet and exercise are not working.  If you smoke, find out from your health care provider how to quit. If you do not use tobacco, do not start.  Lung cancer screening is recommended for adults aged 45-80 years who are at high risk for developing lung cancer because of a history of smoking. A yearly low-dose CT scan of the lungs is recommended for people who have at least a 30-pack-year history of smoking and are a current smoker or have quit within the past 15 years. A pack year of smoking is smoking an average of 1 pack of cigarettes a day for 1 year (for example: 1 pack a day for 30 years or 2 packs a day for 15 years). Yearly screening should continue until the smoker has stopped smoking for at least 15 years. Yearly screening should be stopped for people who develop a health problem that would prevent them from having lung cancer treatment.  If you are pregnant, do not drink alcohol. If you are  breastfeeding, be very cautious about drinking alcohol. If you are not pregnant and choose to drink alcohol, do not have more than 1 drink per day. One drink is considered to be 12 ounces (355 mL) of beer, 5 ounces (148 mL) of wine, or 1.5 ounces (44 mL) of liquor.  Avoid use of street drugs. Do not share needles with anyone. Ask for help if you need support or instructions about stopping the use of drugs.  High blood pressure causes heart disease and increases the risk  of stroke. Your blood pressure should be checked at least every 1 to 2 years. Ongoing high blood pressure should be treated with medicines if weight loss and exercise do not work.  If you are 55-79 years old, ask your health care provider if you should take aspirin to prevent strokes.  Diabetes screening is done by taking a blood sample to check your blood glucose level after you have not eaten for a certain period of time (fasting). If you are not overweight and you do not have risk factors for diabetes, you should be screened once every 3 years starting at age 45. If you are overweight or obese and you are 40-70 years of age, you should be screened for diabetes every year as part of your cardiovascular risk assessment.  Breast cancer screening is essential preventive care for women. You should practice "breast self-awareness." This means understanding the normal appearance and feel of your breasts and may include breast self-examination. Any changes detected, no matter how small, should be reported to a health care provider. Women in their 20s and 30s should have a clinical breast exam (CBE) by a health care provider as part of a regular health exam every 1 to 3 years. After age 40, women should have a CBE every year. Starting at age 40, women should consider having a mammogram (breast X-ray test) every year. Women who have a family history of breast cancer should talk to their health care provider about genetic screening. Women at a high risk of breast cancer should talk to their health care providers about having an MRI and a mammogram every year.  Breast cancer gene (BRCA)-related cancer risk assessment is recommended for women who have family members with BRCA-related cancers. BRCA-related cancers include breast, ovarian, tubal, and peritoneal cancers. Having family members with these cancers may be associated with an increased risk for harmful changes (mutations) in the breast cancer genes BRCA1 and  BRCA2. Results of the assessment will determine the need for genetic counseling and BRCA1 and BRCA2 testing.  Your health care provider may recommend that you be screened regularly for cancer of the pelvic organs (ovaries, uterus, and vagina). This screening involves a pelvic examination, including checking for microscopic changes to the surface of your cervix (Pap test). You may be encouraged to have this screening done every 3 years, beginning at age 21.  For women ages 30-65, health care providers may recommend pelvic exams and Pap testing every 3 years, or they may recommend the Pap and pelvic exam, combined with testing for human papilloma virus (HPV), every 5 years. Some types of HPV increase your risk of cervical cancer. Testing for HPV may also be done on women of any age with unclear Pap test results.  Other health care providers may not recommend any screening for nonpregnant women who are considered low risk for pelvic cancer and who do not have symptoms. Ask your health care provider if a screening pelvic exam is right for   you.  If you have had past treatment for cervical cancer or a condition that could lead to cancer, you need Pap tests and screening for cancer for at least 20 years after your treatment. If Pap tests have been discontinued, your risk factors (such as having a new sexual partner) need to be reassessed to determine if screening should resume. Some women have medical problems that increase the chance of getting cervical cancer. In these cases, your health care provider may recommend more frequent screening and Pap tests.  Colorectal cancer can be detected and often prevented. Most routine colorectal cancer screening begins at the age of 50 years and continues through age 75 years. However, your health care provider may recommend screening at an earlier age if you have risk factors for colon cancer. On a yearly basis, your health care provider may provide home test kits to check  for hidden blood in the stool. Use of a small camera at the end of a tube, to directly examine the colon (sigmoidoscopy or colonoscopy), can detect the earliest forms of colorectal cancer. Talk to your health care provider about this at age 50, when routine screening begins. Direct exam of the colon should be repeated every 5-10 years through age 75 years, unless early forms of precancerous polyps or small growths are found.  People who are at an increased risk for hepatitis B should be screened for this virus. You are considered at high risk for hepatitis B if:  You were born in a country where hepatitis B occurs often. Talk with your health care provider about which countries are considered high risk.  Your parents were born in a high-risk country and you have not received a shot to protect against hepatitis B (hepatitis B vaccine).  You have HIV or AIDS.  You use needles to inject street drugs.  You live with, or have sex with, someone who has hepatitis B.  You get hemodialysis treatment.  You take certain medicines for conditions like cancer, organ transplantation, and autoimmune conditions.  Hepatitis C blood testing is recommended for all people born from 1945 through 1965 and any individual with known risks for hepatitis C.  Practice safe sex. Use condoms and avoid high-risk sexual practices to reduce the spread of sexually transmitted infections (STIs). STIs include gonorrhea, chlamydia, syphilis, trichomonas, herpes, HPV, and human immunodeficiency virus (HIV). Herpes, HIV, and HPV are viral illnesses that have no cure. They can result in disability, cancer, and death.  You should be screened for sexually transmitted illnesses (STIs) including gonorrhea and chlamydia if:  You are sexually active and are younger than 24 years.  You are older than 24 years and your health care provider tells you that you are at risk for this type of infection.  Your sexual activity has changed  since you were last screened and you are at an increased risk for chlamydia or gonorrhea. Ask your health care provider if you are at risk.  If you are at risk of being infected with HIV, it is recommended that you take a prescription medicine daily to prevent HIV infection. This is called preexposure prophylaxis (PrEP). You are considered at risk if:  You are sexually active and do not regularly use condoms or know the HIV status of your partner(s).  You take drugs by injection.  You are sexually active with a partner who has HIV.  Talk with your health care provider about whether you are at high risk of being infected with HIV. If   you choose to begin PrEP, you should first be tested for HIV. You should then be tested every 3 months for as long as you are taking PrEP.  Osteoporosis is a disease in which the bones lose minerals and strength with aging. This can result in serious bone fractures or breaks. The risk of osteoporosis can be identified using a bone density scan. Women ages 67 years and over and women at risk for fractures or osteoporosis should discuss screening with their health care providers. Ask your health care provider whether you should take a calcium supplement or vitamin D to reduce the rate of osteoporosis.  Menopause can be associated with physical symptoms and risks. Hormone replacement therapy is available to decrease symptoms and risks. You should talk to your health care provider about whether hormone replacement therapy is right for you.  Use sunscreen. Apply sunscreen liberally and repeatedly throughout the day. You should seek shade when your shadow is shorter than you. Protect yourself by wearing long sleeves, pants, a wide-brimmed hat, and sunglasses year round, whenever you are outdoors.  Once a month, do a whole body skin exam, using a mirror to look at the skin on your back. Tell your health care provider of new moles, moles that have irregular borders, moles that  are larger than a pencil eraser, or moles that have changed in shape or color.  Stay current with required vaccines (immunizations).  Influenza vaccine. All adults should be immunized every year.  Tetanus, diphtheria, and acellular pertussis (Td, Tdap) vaccine. Pregnant women should receive 1 dose of Tdap vaccine during each pregnancy. The dose should be obtained regardless of the length of time since the last dose. Immunization is preferred during the 27th-36th week of gestation. An adult who has not previously received Tdap or who does not know her vaccine status should receive 1 dose of Tdap. This initial dose should be followed by tetanus and diphtheria toxoids (Td) booster doses every 10 years. Adults with an unknown or incomplete history of completing a 3-dose immunization series with Td-containing vaccines should begin or complete a primary immunization series including a Tdap dose. Adults should receive a Td booster every 10 years.  Varicella vaccine. An adult without evidence of immunity to varicella should receive 2 doses or a second dose if she has previously received 1 dose. Pregnant females who do not have evidence of immunity should receive the first dose after pregnancy. This first dose should be obtained before leaving the health care facility. The second dose should be obtained 4-8 weeks after the first dose.  Human papillomavirus (HPV) vaccine. Females aged 13-26 years who have not received the vaccine previously should obtain the 3-dose series. The vaccine is not recommended for use in pregnant females. However, pregnancy testing is not needed before receiving a dose. If a female is found to be pregnant after receiving a dose, no treatment is needed. In that case, the remaining doses should be delayed until after the pregnancy. Immunization is recommended for any person with an immunocompromised condition through the age of 61 years if she did not get any or all doses earlier. During the  3-dose series, the second dose should be obtained 4-8 weeks after the first dose. The third dose should be obtained 24 weeks after the first dose and 16 weeks after the second dose.  Zoster vaccine. One dose is recommended for adults aged 30 years or older unless certain conditions are present.  Measles, mumps, and rubella (MMR) vaccine. Adults born  before 1957 generally are considered immune to measles and mumps. Adults born in 1957 or later should have 1 or more doses of MMR vaccine unless there is a contraindication to the vaccine or there is laboratory evidence of immunity to each of the three diseases. A routine second dose of MMR vaccine should be obtained at least 28 days after the first dose for students attending postsecondary schools, health care workers, or international travelers. People who received inactivated measles vaccine or an unknown type of measles vaccine during 1963-1967 should receive 2 doses of MMR vaccine. People who received inactivated mumps vaccine or an unknown type of mumps vaccine before 1979 and are at high risk for mumps infection should consider immunization with 2 doses of MMR vaccine. For females of childbearing age, rubella immunity should be determined. If there is no evidence of immunity, females who are not pregnant should be vaccinated. If there is no evidence of immunity, females who are pregnant should delay immunization until after pregnancy. Unvaccinated health care workers born before 1957 who lack laboratory evidence of measles, mumps, or rubella immunity or laboratory confirmation of disease should consider measles and mumps immunization with 2 doses of MMR vaccine or rubella immunization with 1 dose of MMR vaccine.  Pneumococcal 13-valent conjugate (PCV13) vaccine. When indicated, a person who is uncertain of his immunization history and has no record of immunization should receive the PCV13 vaccine. All adults 65 years of age and older should receive this  vaccine. An adult aged 19 years or older who has certain medical conditions and has not been previously immunized should receive 1 dose of PCV13 vaccine. This PCV13 should be followed with a dose of pneumococcal polysaccharide (PPSV23) vaccine. Adults who are at high risk for pneumococcal disease should obtain the PPSV23 vaccine at least 8 weeks after the dose of PCV13 vaccine. Adults older than 62 years of age who have normal immune system function should obtain the PPSV23 vaccine dose at least 1 year after the dose of PCV13 vaccine.  Pneumococcal polysaccharide (PPSV23) vaccine. When PCV13 is also indicated, PCV13 should be obtained first. All adults aged 65 years and older should be immunized. An adult younger than age 65 years who has certain medical conditions should be immunized. Any person who resides in a nursing home or long-term care facility should be immunized. An adult smoker should be immunized. People with an immunocompromised condition and certain other conditions should receive both PCV13 and PPSV23 vaccines. People with human immunodeficiency virus (HIV) infection should be immunized as soon as possible after diagnosis. Immunization during chemotherapy or radiation therapy should be avoided. Routine use of PPSV23 vaccine is not recommended for American Indians, [place] Natives, or people younger than 65 years unless there are medical conditions that require PPSV23 vaccine. When indicated, people who have unknown immunization and have no record of immunization should receive PPSV23 vaccine. One-time revaccination 5 years after the first dose of PPSV23 is recommended for people aged 19-64 years who have chronic kidney failure, nephrotic syndrome, asplenia, or immunocompromised conditions. People who received 1-2 doses of PPSV23 before age 65 years should receive another dose of PPSV23 vaccine at age 65 years or later if at least 5 years have passed since the previous dose. Doses of PPSV23 are not  needed for people immunized with PPSV23 at or after age 65 years.  Meningococcal vaccine. Adults with asplenia or persistent complement component deficiencies should receive 2 doses of quadrivalent meningococcal conjugate (MenACWY-D) vaccine. The doses should be obtained   at least 2 months apart. Microbiologists working with certain meningococcal bacteria, [place] recruits, people at risk during an outbreak, and people who travel to or live in countries with a high rate of meningitis should be immunized. A first-year college student up through age 34 years who is living in a residence hall should receive a dose if she did not receive a dose on or after her 16th birthday. Adults who have certain high-risk conditions should receive one or more doses of vaccine.  Hepatitis A vaccine. Adults who wish to be protected from this disease, have certain high-risk conditions, work with hepatitis A-infected animals, work in hepatitis A research labs, or travel to or work in countries with a high rate of hepatitis A should be immunized. Adults who were previously unvaccinated and who anticipate close contact with an international adoptee during the first 60 days after arrival in the [place] from a country with a high rate of hepatitis A should be immunized.  Hepatitis B vaccine. Adults who wish to be protected from this disease, have certain high-risk conditions, may be exposed to blood or other infectious body fluids, are household contacts or sex partners of hepatitis B positive people, are clients or workers in certain care facilities, or travel to or work in countries with a high rate of hepatitis B should be immunized.  Haemophilus influenzae type b (Hib) vaccine. A previously unvaccinated person with asplenia or sickle cell disease or having a scheduled splenectomy should receive 1 dose of Hib vaccine. Regardless of previous immunization, a recipient of a hematopoietic stem cell transplant should receive a  3-dose series 6-12 months after her successful transplant. Hib vaccine is not recommended for adults with HIV infection. Preventive Services / Frequency Ages 35 to 4 years  Blood pressure check.** / Every 3-5 years.  Lipid and cholesterol check.** / Every 5 years beginning at age 60.  Clinical breast exam.** / Every 3 years for women in their 71s and 10s.  BRCA-related cancer risk assessment.** / For women who have family members with a BRCA-related cancer (breast, ovarian, tubal, or peritoneal cancers).  Pap test.** / Every 2 years from ages 76 through 26. Every 3 years starting at age 61 through age 76 or 93 with a history of 3 consecutive normal Pap tests.  HPV screening.** / Every 3 years from ages 37 through ages 60 to 51 with a history of 3 consecutive normal Pap tests.  Hepatitis C blood test.** / For any individual with known risks for hepatitis C.  Skin self-exam. / Monthly.  Influenza vaccine. / Every year.  Tetanus, diphtheria, and acellular pertussis (Tdap, Td) vaccine.** / Consult your health care provider. Pregnant women should receive 1 dose of Tdap vaccine during each pregnancy. 1 dose of Td every 10 years.  Varicella vaccine.** / Consult your health care provider. Pregnant females who do not have evidence of immunity should receive the first dose after pregnancy.  HPV vaccine. / 3 doses over 6 months, if 93 and younger. The vaccine is not recommended for use in pregnant females. However, pregnancy testing is not needed before receiving a dose.  Measles, mumps, rubella (MMR) vaccine.** / You need at least 1 dose of MMR if you were born in 1957 or later. You may also need a 2nd dose. For females of childbearing age, rubella immunity should be determined. If there is no evidence of immunity, females who are not pregnant should be vaccinated. If there is no evidence of immunity, females who are  pregnant should delay immunization until after pregnancy.  Pneumococcal  13-valent conjugate (PCV13) vaccine.** / Consult your health care provider.  Pneumococcal polysaccharide (PPSV23) vaccine.** / 1 to 2 doses if you smoke cigarettes or if you have certain conditions.  Meningococcal vaccine.** / 1 dose if you are age 68 to 8 years and a Market researcher living in a residence hall, or have one of several medical conditions, you need to get vaccinated against meningococcal disease. You may also need additional booster doses.  Hepatitis A vaccine.** / Consult your health care provider.  Hepatitis B vaccine.** / Consult your health care provider.  Haemophilus influenzae type b (Hib) vaccine.** / Consult your health care provider. Ages 7 to 53 years  Blood pressure check.** / Every year.  Lipid and cholesterol check.** / Every 5 years beginning at age 25 years.  Lung cancer screening. / Every year if you are aged 11-80 years and have a 30-pack-year history of smoking and currently smoke or have quit within the past 15 years. Yearly screening is stopped once you have quit smoking for at least 15 years or develop a health problem that would prevent you from having lung cancer treatment.  Clinical breast exam.** / Every year after age 48 years.  BRCA-related cancer risk assessment.** / For women who have family members with a BRCA-related cancer (breast, ovarian, tubal, or peritoneal cancers).  Mammogram.** / Every year beginning at age 41 years and continuing for as long as you are in good health. Consult with your health care provider.  Pap test.** / Every 3 years starting at age 65 years through age 37 or 70 years with a history of 3 consecutive normal Pap tests.  HPV screening.** / Every 3 years from ages 72 years through ages 60 to 40 years with a history of 3 consecutive normal Pap tests.  Fecal occult blood test (FOBT) of stool. / Every year beginning at age 21 years and continuing until age 5 years. You may not need to do this test if you get  a colonoscopy every 10 years.  Flexible sigmoidoscopy or colonoscopy.** / Every 5 years for a flexible sigmoidoscopy or every 10 years for a colonoscopy beginning at age 35 years and continuing until age 48 years.  Hepatitis C blood test.** / For all people born from [birth year] through 1965 and any individual with known risks for hepatitis C.  Skin self-exam. / Monthly.  Influenza vaccine. / Every year.  Tetanus, diphtheria, and acellular pertussis (Tdap/Td) vaccine.** / Consult your health care provider. Pregnant women should receive 1 dose of Tdap vaccine during each pregnancy. 1 dose of Td every 10 years.  Varicella vaccine.** / Consult your health care provider. Pregnant females who do not have evidence of immunity should receive the first dose after pregnancy.  Zoster vaccine.** / 1 dose for adults aged 30 years or older.  Measles, mumps, rubella (MMR) vaccine.** / You need at least 1 dose of MMR if you were born in 1957 or later. You may also need a second dose. For females of childbearing age, rubella immunity should be determined. If there is no evidence of immunity, females who are not pregnant should be vaccinated. If there is no evidence of immunity, females who are pregnant should delay immunization until after pregnancy.  Pneumococcal 13-valent conjugate (PCV13) vaccine.** / Consult your health care provider.  Pneumococcal polysaccharide (PPSV23) vaccine.** / 1 to 2 doses if you smoke cigarettes or if you have certain conditions.  Meningococcal vaccine.** /  Consult your health care provider.  Hepatitis A vaccine.** / Consult your health care provider.  Hepatitis B vaccine.** / Consult your health care provider.  Haemophilus influenzae type b (Hib) vaccine.** / Consult your health care provider. Ages 64 years and over  Blood pressure check.** / Every year.  Lipid and cholesterol check.** / Every 5 years beginning at age 23 years.  Lung cancer screening. / Every year if you  are aged 16-80 years and have a 30-pack-year history of smoking and currently smoke or have quit within the past 15 years. Yearly screening is stopped once you have quit smoking for at least 15 years or develop a health problem that would prevent you from having lung cancer treatment.  Clinical breast exam.** / Every year after age 74 years.  BRCA-related cancer risk assessment.** / For women who have family members with a BRCA-related cancer (breast, ovarian, tubal, or peritoneal cancers).  Mammogram.** / Every year beginning at age 44 years and continuing for as long as you are in good health. Consult with your health care provider.  Pap test.** / Every 3 years starting at age 58 years through age 22 or 39 years with 3 consecutive normal Pap tests. Testing can be stopped between 65 and 70 years with 3 consecutive normal Pap tests and no abnormal Pap or HPV tests in the past 10 years.  HPV screening.** / Every 3 years from ages 64 years through ages 70 or 61 years with a history of 3 consecutive normal Pap tests. Testing can be stopped between 65 and 70 years with 3 consecutive normal Pap tests and no abnormal Pap or HPV tests in the past 10 years.  Fecal occult blood test (FOBT) of stool. / Every year beginning at age 40 years and continuing until age 27 years. You may not need to do this test if you get a colonoscopy every 10 years.  Flexible sigmoidoscopy or colonoscopy.** / Every 5 years for a flexible sigmoidoscopy or every 10 years for a colonoscopy beginning at age 7 years and continuing until age 32 years.  Hepatitis C blood test.** / For all people born from [birth year] through 1967 and any individual with known risks for hepatitis C.  Osteoporosis screening.** / A one-time screening for women ages 30 years and over and women at risk for fractures or osteoporosis.  Skin self-exam. / Monthly.  Influenza vaccine. / Every year.  Tetanus, diphtheria, and acellular pertussis (Tdap/Td)  vaccine.** / 1 dose of Td every 10 years.  Varicella vaccine.** / Consult your health care provider.  Zoster vaccine.** / 1 dose for adults aged 35 years or older.  Pneumococcal 13-valent conjugate (PCV13) vaccine.** / Consult your health care provider.  Pneumococcal polysaccharide (PPSV23) vaccine.** / 1 dose for all adults aged 46 years and older.  Meningococcal vaccine.** / Consult your health care provider.  Hepatitis A vaccine.** / Consult your health care provider.  Hepatitis B vaccine.** / Consult your health care provider.  Haemophilus influenzae type b (Hib) vaccine.** / Consult your health care provider. ** Family history and personal history of risk and conditions may change your health care provider's recommendations.   This information is not intended to replace advice given to you by your health care provider. Make sure you discuss any questions you have with your health care provider.   Document Released: 10/19/2001 Document Revised: 09/13/2014 Document Reviewed: 01/18/2011 Elsevier Interactive Patient Education [employer].

## 2015-11-12 ENCOUNTER — Other Ambulatory Visit: Payer: Medicare Other

## 2015-11-14 ENCOUNTER — Other Ambulatory Visit: Payer: Medicare Other

## 2015-11-23 ENCOUNTER — Other Ambulatory Visit: Payer: Self-pay | Admitting: Family Medicine

## 2015-11-24 NOTE — Telephone Encounter (Signed)
Medication filled to pharmacy as requested.   

## 2015-11-28 DIAGNOSIS — M109 Gout, unspecified: Secondary | ICD-10-CM | POA: Diagnosis not present

## 2015-11-28 DIAGNOSIS — N179 Acute kidney failure, unspecified: Secondary | ICD-10-CM | POA: Diagnosis not present

## 2015-11-28 DIAGNOSIS — N182 Chronic kidney disease, stage 2 (mild): Secondary | ICD-10-CM | POA: Diagnosis not present

## 2015-11-28 DIAGNOSIS — D509 Iron deficiency anemia, unspecified: Secondary | ICD-10-CM | POA: Diagnosis not present

## 2015-11-28 DIAGNOSIS — E0590 Thyrotoxicosis, unspecified without thyrotoxic crisis or storm: Secondary | ICD-10-CM | POA: Diagnosis not present

## 2015-11-30 ENCOUNTER — Telehealth: Payer: Self-pay | Admitting: Family Medicine

## 2015-12-01 DIAGNOSIS — K5190 Ulcerative colitis, unspecified, without complications: Secondary | ICD-10-CM | POA: Diagnosis not present

## 2015-12-01 DIAGNOSIS — K91850 Pouchitis: Secondary | ICD-10-CM

## 2015-12-01 DIAGNOSIS — K624 Stenosis of anus and rectum: Secondary | ICD-10-CM | POA: Diagnosis not present

## 2015-12-01 DIAGNOSIS — K219 Gastro-esophageal reflux disease without esophagitis: Secondary | ICD-10-CM | POA: Diagnosis not present

## 2015-12-01 DIAGNOSIS — R197 Diarrhea, unspecified: Secondary | ICD-10-CM | POA: Diagnosis not present

## 2015-12-01 DIAGNOSIS — K529 Noninfective gastroenteritis and colitis, unspecified: Secondary | ICD-10-CM | POA: Diagnosis not present

## 2015-12-01 DIAGNOSIS — K6289 Other specified diseases of anus and rectum: Secondary | ICD-10-CM | POA: Insufficient documentation

## 2015-12-01 HISTORY — DX: Pouchitis: K91.850

## 2015-12-09 ENCOUNTER — Telehealth: Payer: Self-pay | Admitting: Family Medicine

## 2015-12-09 DIAGNOSIS — Z1231 Encounter for screening mammogram for malignant neoplasm of breast: Secondary | ICD-10-CM | POA: Diagnosis not present

## 2015-12-09 LAB — HM MAMMOGRAPHY

## 2015-12-10 NOTE — Telephone Encounter (Signed)
Last seen 11/11/15 and filled 11/05/15 #30   Please advise    KP

## 2015-12-11 ENCOUNTER — Telehealth: Payer: Self-pay | Admitting: Family Medicine

## 2015-12-11 NOTE — Telephone Encounter (Signed)
Call to [place] to [facility] DX: depression & generalized anxiety ICD10: F32.9 Req Cymbalta 13m capsule brand-name Sig: Tak 2 tablets by mouth once daily Days Supply: 90 Qty: 180

## 2015-12-12 ENCOUNTER — Other Ambulatory Visit: Payer: Self-pay | Admitting: Family Medicine

## 2015-12-12 NOTE — Telephone Encounter (Signed)
Pt called in to FU on medication refill. Showing refilled. Called pharmacy to confirm on pt's behalf. Pharmacy states that a PA is needed for medication. They stated that a PA has been faxed over several times. Informed of the PA time frame. Pt says that she will only have enough medication for the weekend.

## 2015-12-12 NOTE — Telephone Encounter (Signed)
Spoke with pharmacy, confirmed not showing Rx received for medication.

## 2015-12-12 NOTE — Telephone Encounter (Signed)
Rx phoned in.     KP

## 2015-12-15 ENCOUNTER — Telehealth: Payer: Self-pay | Admitting: Family Medicine

## 2015-12-15 MED ORDER — DULOXETINE HCL 60 MG PO CPEP: ORAL_CAPSULE | ORAL | Status: DC

## 2015-12-15 NOTE — Telephone Encounter (Signed)
Relation to IY:JGZQ Call back number:[phone] [id number] [place], [place]  Reason for call:  Patient called checking on the status of PA advised patient on PA process.

## 2015-12-15 NOTE — Telephone Encounter (Signed)
PA denied. Alternatives include escitalopram, Paxil suspension, paroxetine tabs, and venlafaxine ER capsule. Please advise. JG//CMA

## 2015-12-15 NOTE — Telephone Encounter (Signed)
She called back and stated she would start the Generic. Please advise    KP

## 2015-12-15 NOTE — Telephone Encounter (Signed)
Please see the below message and advise if this is appropriate.    KP

## 2015-12-15 NOTE — Telephone Encounter (Signed)
Generic is ok if it is available

## 2015-12-15 NOTE — Telephone Encounter (Signed)
Spoke with patient and she stated she never tried the generic because the brand name worked for her, she said she is willing to try whatever you want. Please advise    KP

## 2015-12-15 NOTE — Telephone Encounter (Signed)
[facility]  Can be reached: [phone]   Reason for call: [facility] CYMBALTA 60 MG capsule [[id number]]. States that the patient wants to take the generic brand so that it will be covered by insurance because they are requiring a PA for the Brand name

## 2015-12-15 NOTE — Telephone Encounter (Signed)
I can't just stop the cymbalta and switch her she has to be weaned off

## 2015-12-15 NOTE — Telephone Encounter (Signed)
Pt said that she wants to try generic Cymbalta and asked to send 2 week supply to local pharmacy to try to wean off. She'll pay out of pocket.

## 2015-12-15 NOTE — Telephone Encounter (Signed)
Rx faxed.    KP 

## 2015-12-16 NOTE — Telephone Encounter (Signed)
Ewing Schlein, CMA at 12/15/2015 4:38 PM     Status: Signed       Expand All Collapse All   Please see the below message and advise if this is appropriate. KP            Margot Ables at 12/15/2015 2:39 PM     Status: Signed       Expand All Collapse All   Pt said that she wants to try generic Cymbalta and asked to send 2 week supply to local pharmacy to try to wean off. She'll pay out of pocket.            Ann Held, DO at 12/15/2015 1:31 PM     Status: Signed       Expand All Collapse All   I can't just stop the cymbalta and switch her she has to be weaned off            Chaya Jan, CMA at 12/15/2015 9:58 AM     Status: Signed       Expand All Collapse All   PA denied. Alternatives include escitalopram, Paxil suspension, paroxetine tabs, and venlafaxine ER capsule. Please advise. JG//CMA             Oneta Rack at 12/15/2015 8:43 AM     Status: Signed       Expand All Collapse All   Relation to RT:MYTR Call back number:[phone] [id number] [place] [address]  Reason for call:  Patient called checking on the status of PA advised patient on PA process.             KMargot Ablesat 12/11/2015 3:37 PM     Status: Signed       Expand All Collapse All   [facility] [place]: depression & generalized anxiety ICD10: F32.9 Req Cymbalta 669mcapsule brand-name Sig: Tak 2 tablets by mouth once daily Days Supply: 90 Qty: 180

## 2015-12-17 ENCOUNTER — Encounter: Payer: Self-pay | Admitting: Family Medicine

## 2016-01-06 ENCOUNTER — Ambulatory Visit: Payer: Medicare Other | Admitting: Family Medicine

## 2016-01-08 ENCOUNTER — Ambulatory Visit (INDEPENDENT_AMBULATORY_CARE_PROVIDER_SITE_OTHER): Payer: Medicare Other | Admitting: Family Medicine

## 2016-01-08 ENCOUNTER — Encounter: Payer: Self-pay | Admitting: Family Medicine

## 2016-01-08 VITALS — BP 136/86 | HR 117 | Temp 99.0°F | Ht 64.0 in | Wt 171.8 lb

## 2016-01-08 DIAGNOSIS — G4700 Insomnia, unspecified: Secondary | ICD-10-CM

## 2016-01-08 DIAGNOSIS — F32A Depression, unspecified: Secondary | ICD-10-CM

## 2016-01-08 DIAGNOSIS — F329 Major depressive disorder, single episode, unspecified: Secondary | ICD-10-CM | POA: Diagnosis not present

## 2016-01-08 MED ORDER — DULOXETINE HCL 60 MG PO CPEP: ORAL_CAPSULE | ORAL | Status: DC

## 2016-01-08 MED ORDER — BUPROPION HCL ER (XL) 300 MG PO TB24: 300.0000 mg | ORAL_TABLET | Freq: Every day | ORAL | Status: DC

## 2016-01-08 MED ORDER — BUPROPION HCL ER (XL) 150 MG PO TB24: ORAL_TABLET | ORAL | Status: DC

## 2016-01-08 MED ORDER — TEMAZEPAM 30 MG PO CAPS: 30.0000 mg | ORAL_CAPSULE | Freq: Every day | ORAL | Status: DC

## 2016-01-08 NOTE — Progress Notes (Signed)
Pre visit review using our clinic review tool, if applicable. No additional management support is needed unless otherwise documented below in the visit note. 

## 2016-01-08 NOTE — Progress Notes (Signed)
Patient ID: [name], female    DOB: 18-Sep-1955  Age: 60 y.o. MRN: [id number]    Subjective:  Subjective HPI [name] presents for c/o depression.   Pt would like to try "pill" we tried in past.  Pt is not suicidal.    Review of Systems  Constitutional: Negative for diaphoresis, appetite change, fatigue and unexpected weight change.  Eyes: Negative for pain, redness and visual disturbance.  Respiratory: Negative for cough, chest tightness, shortness of breath and wheezing.   Cardiovascular: Negative for chest pain, palpitations and leg swelling.  Endocrine: Negative for cold intolerance, heat intolerance, polydipsia, polyphagia and polyuria.  Genitourinary: Negative for dysuria, frequency and difficulty urinating.  Neurological: Negative for dizziness, light-headedness, numbness and headaches.  Psychiatric/Behavioral: Positive for dysphoric mood and decreased concentration.    History Past Medical History  Diagnosis Date  . History of small bowel obstruction   . Obesity   . Hyperparathyroidism   . Hypokalemia   . Hypertension   . Gastric polyp   . GERD (gastroesophageal reflux disease)   . Depression   . Anxiety   . Anemia   . Renal insufficiency   . Ulcerative colitis   . RLS (restless legs syndrome)   . Pulmonary nodule, right     right upper lobe  . Anal stenosis   . Esophagitis   . De Quervain's tenosynovitis   . Hyperlipidemia   . Fibroid   . History of cervical dysplasia   . Asthma   . Hearing loss   . Visual disturbance   . Abdominal pain   . Rectal bleeding   . Blood in stool   . Rectal pain   . Difficulty urinating   . Blood in urine   . Fainting     due to dehydration  . Weakness generalized   . Fatigue   . Easy bruising   . Hemorrhoids, internal   . Hemorrhoids, external   . Ovarian cyst   . Herpes, genital     vaginal treated 07/05/12 and pt states is resolved    She has past surgical history that includes Anal dilation;  Restorative proctocolectomy; Ileostomy closure; Cholecystectomy; Tubal ligation; Hemorrhoid surgery; fatty tumor removed from back; Total abdominal hysterectomy (1998); Cervical biopsy w/ loop electrode excision; and Upper gastrointestinal endoscopy.   Her family history includes Cancer in her maternal uncle and sister; Diabetes in her sister; Heart disease in her father and mother; Hypertension in her father and mother; Ulcerative colitis in her daughter and father. There is no history of Colon cancer.She reports that she quit smoking about 36 years ago. Her smoking use included Cigarettes. She has never used smokeless tobacco. She reports that she does not drink alcohol or use illicit drugs.  Current Outpatient Prescriptions on File Prior to Visit  Medication Sig Dispense Refill  . albuterol (PROAIR HFA) 108 (90 BASE) MCG/ACT inhaler Inhale 2 puffs into the lungs every 6 (six) hours as needed for wheezing. 1 Inhaler 3  . calcitRIOL (ROCALTROL) 0.25 MCG capsule Take 0.25 mcg by mouth every other day. Mon, Wed, Fri    . HYDROcodone-acetaminophen (NORCO/VICODIN) 5-325 MG tablet Take 2 tablets by mouth every 4 (four) hours as needed for moderate pain or severe pain. 90 tablet 0  . mesalamine (CANASA) 1000 MG suppository Place 1 suppository (1,000 mg total) rectally at bedtime. 30 suppository 3  . metoprolol succinate (TOPROL-XL) 100 MG 24 hr tablet Take 1/2 tablet by mouth daily 45 tablet 0  .  Multiple Vitamins-Minerals (MULTIVITAL) CHEW Chew by mouth.    . Probiotic Product (VSL#3) CAPS Take 1 tablet daily by mouth 60 capsule 0  . ranitidine (ZANTAC) 150 MG tablet Take one po BID 60 tablet 6  . ULORIC 40 MG tablet TAKE 1 TABLET (40 MG TOTAL) BY MOUTH DAILY. 30 tablet 11   No current facility-administered medications on file prior to visit.     Objective:  Objective Physical Exam  Constitutional: She is oriented to person, place, and time. She appears well-developed and well-nourished.  HENT:    Head: Normocephalic and atraumatic.  Eyes: Conjunctivae and EOM are normal.  Neck: Normal range of motion. Neck supple. No JVD present. Carotid bruit is not present. No thyromegaly present.  Cardiovascular: Normal rate, regular rhythm and normal heart sounds.   No murmur heard. Pulmonary/Chest: Effort normal and breath sounds normal. No respiratory distress. She has no wheezes. She has no rales. She exhibits no tenderness.  Musculoskeletal: She exhibits no edema.  Neurological: She is alert and oriented to person, place, and time.  Psychiatric: Her behavior is normal. Judgment and thought content normal. Her mood appears anxious. She exhibits a depressed mood. She expresses no homicidal and no suicidal ideation. She expresses no suicidal plans and no homicidal plans.  Nursing note and vitals reviewed.  BP 136/86 mmHg  Pulse 117  Temp(Src) 99 F (37.2 C) (Oral)  Ht 5' 4"  (1.626 m)  Wt 171 lb 12.8 oz (77.928 kg)  BMI 29.47 kg/m2  SpO2 95% Wt Readings from Last 3 Encounters:  01/08/16 171 lb 12.8 oz (77.928 kg)  11/11/15 170 lb 3.2 oz (77.202 kg)  08/29/15 169 lb (76.658 kg)     Lab Results  Component Value Date   WBC 12.1* 07/04/2015   HGB 11.2* 07/04/2015   HCT 34.0* 07/04/2015   PLT 422.0* 07/04/2015   GLUCOSE 91 06/25/2015   GLUCOSE 91 06/25/2015   CHOL 199 11/22/2014   TRIG 156.0* 11/22/2014   HDL 45.80 11/22/2014   LDLDIRECT 127.0 09/19/2006   LDLCALC 122* 11/22/2014   ALT 15 06/25/2015   AST 19 06/25/2015   NA 138 06/25/2015   NA 138 06/25/2015   K 4.1 06/25/2015   K 4.1 06/25/2015   CL 104 06/25/2015   CL 104 06/25/2015   CREATININE 2.37* 06/25/2015   CREATININE 2.37* 06/25/2015   BUN 28* 06/25/2015   BUN 28* 06/25/2015   CO2 25 06/25/2015   CO2 25 06/25/2015   TSH 2.20 02/04/2009    No results found.   Assessment & Plan:  Plan I have discontinued Ms. Rinn's metroNIDAZOLE. I have also changed her temazepam. Additionally, I am having her start on  buPROPion and buPROPion. Lastly, I am having her maintain her MULTIVITAL, albuterol, calcitRIOL, VSL#3, mesalamine, ranitidine, HYDROcodone-acetaminophen, metoprolol succinate, ULORIC, and DULoxetine.  Meds ordered this encounter  Medications  . buPROPion (WELLBUTRIN XL) 150 MG 24 hr tablet    Sig: 1 po qd x 1 week then take 2 po qd    Dispense:  60 tablet    Refill:  0  . buPROPion (WELLBUTRIN XL) 300 MG 24 hr tablet    Sig: Take 1 tablet (300 mg total) by mouth daily.    Dispense:  30 tablet    Refill:  5  . DULoxetine (CYMBALTA) 60 MG capsule    Sig: TAKE 2 TABLETS BY MOUTH ONCE DAILY    Dispense:  180 capsule    Refill:  1  . temazepam (RESTORIL) 30 MG  capsule    Sig: Take 1 capsule (30 mg total) by mouth at bedtime.    Dispense:  30 capsule    Refill:  3    Problem List Items Addressed This Visit    None    Visit Diagnoses    Depression    -  Primary    Relevant Medications    buPROPion (WELLBUTRIN XL) 150 MG 24 hr tablet    buPROPion (WELLBUTRIN XL) 300 MG 24 hr tablet    DULoxetine (CYMBALTA) 60 MG capsule    Insomnia        Relevant Medications    temazepam (RESTORIL) 30 MG capsule     wellbutrin 150 mg qd x 1 week then inc to 2 po qd   Follow-up: Return in about 3 months (around 04/09/2016), or if symptoms worsen or fail to improve.  Ann Held, DO

## 2016-01-08 NOTE — Patient Instructions (Signed)
Major Depressive Disorder Major depressive disorder is a mental illness. It also may be called clinical depression or unipolar depression. Major depressive disorder usually causes feelings of sadness, hopelessness, or helplessness. Some people with this disorder do not feel particularly sad but lose interest in doing things they used to enjoy (anhedonia). Major depressive disorder also can cause physical symptoms. It can interfere with work, school, relationships, and other normal everyday activities. The disorder varies in severity but is longer lasting and more serious than the sadness we all feel from time to time in our lives. Major depressive disorder often is triggered by stressful life events or major life changes. Examples of these triggers include divorce, loss of your job or home, a move, and the death of a family member or close friend. Sometimes this disorder occurs for no obvious reason at all. People who have family members with major depressive disorder or bipolar disorder are at higher risk for developing this disorder, with or without life stressors. Major depressive disorder can occur at any age. It may occur just once in your life (single episode major depressive disorder). It may occur multiple times (recurrent major depressive disorder). SYMPTOMS People with major depressive disorder have either anhedonia or depressed mood on nearly a daily basis for at least 2 weeks or longer. Symptoms of depressed mood include:  Feelings of sadness (blue or down in the dumps) or emptiness.  Feelings of hopelessness or helplessness.  Tearfulness or episodes of crying (may be observed by others).  Irritability (children and adolescents). In addition to depressed mood or anhedonia or both, people with this disorder have at least four of the following symptoms:  Difficulty sleeping or sleeping too much.   Significant change (increase or decrease) in appetite or weight.   Lack of energy or  motivation.  Feelings of guilt and worthlessness.   Difficulty concentrating, remembering, or making decisions.  Unusually slow movement (psychomotor retardation) or restlessness (as observed by others).   Recurrent wishes for death, recurrent thoughts of self-harm (suicide), or a suicide attempt. People with major depressive disorder commonly have persistent negative thoughts about themselves, other people, and the world. People with severe major depressive disorder may experiencedistorted beliefs or perceptions about the world (psychotic delusions). They also may see or hear things that are not real (psychotic hallucinations). DIAGNOSIS Major depressive disorder is diagnosed through an assessment by your health care provider. Your health care provider will ask aboutaspects of your daily life, such as mood,sleep, and appetite, to see if you have the diagnostic symptoms of major depressive disorder. Your health care provider may ask about your medical history and use of alcohol or drugs, including prescription medicines. Your health care provider also may do a physical exam and blood work. This is because certain medical conditions and the use of certain substances can cause major depressive disorder-like symptoms (secondary depression). Your health care provider also may refer you to a mental health specialist for further evaluation and treatment. TREATMENT It is important to recognize the symptoms of major depressive disorder and seek treatment. The following treatments can be prescribed for this disorder:   Medicine. Antidepressant medicines usually are prescribed. Antidepressant medicines are thought to correct chemical imbalances in the brain that are commonly associated with major depressive disorder. Other types of medicine may be added if the symptoms do not respond to antidepressant medicines alone or if psychotic delusions or hallucinations occur.  Talk therapy. Talk therapy can be  helpful in treating major depressive disorder by providing   support, education, and guidance. Certain types of talk therapy also can help with negative thinking (cognitive behavioral therapy) and with relationship issues that trigger this disorder (interpersonal therapy). A mental health specialist can help determine which treatment is best for you. Most people with major depressive disorder do well with a combination of medicine and talk therapy. Treatments involving electrical stimulation of the brain can be used in situations with extremely severe symptoms or when medicine and talk therapy do not work over time. These treatments include electroconvulsive therapy, transcranial magnetic stimulation, and vagal nerve stimulation.   This information is not intended to replace advice given to you by your health care provider. Make sure you discuss any questions you have with your health care provider.   Document Released: 12/18/2012 Document Revised: 09/13/2014 Document Reviewed: 12/18/2012 Elsevier Interactive Patient Education 2016 [employer]  

## 2016-01-12 ENCOUNTER — Other Ambulatory Visit: Payer: Self-pay | Admitting: *Deleted

## 2016-01-12 MED ORDER — RANITIDINE HCL 150 MG PO TABS: ORAL_TABLET | ORAL | Status: DC

## 2016-02-03 DIAGNOSIS — Z882 Allergy status to sulfonamides status: Secondary | ICD-10-CM | POA: Diagnosis not present

## 2016-02-03 DIAGNOSIS — L290 Pruritus ani: Secondary | ICD-10-CM | POA: Diagnosis not present

## 2016-02-03 DIAGNOSIS — Z980 Intestinal bypass and anastomosis status: Secondary | ICD-10-CM | POA: Diagnosis not present

## 2016-02-03 DIAGNOSIS — Z885 Allergy status to narcotic agent status: Secondary | ICD-10-CM | POA: Diagnosis not present

## 2016-02-03 DIAGNOSIS — F329 Major depressive disorder, single episode, unspecified: Secondary | ICD-10-CM | POA: Diagnosis not present

## 2016-02-03 DIAGNOSIS — K529 Noninfective gastroenteritis and colitis, unspecified: Secondary | ICD-10-CM | POA: Diagnosis not present

## 2016-02-03 DIAGNOSIS — K523 Indeterminate colitis: Secondary | ICD-10-CM | POA: Diagnosis not present

## 2016-02-03 DIAGNOSIS — J45909 Unspecified asthma, uncomplicated: Secondary | ICD-10-CM | POA: Diagnosis not present

## 2016-02-03 DIAGNOSIS — Z79891 Long term (current) use of opiate analgesic: Secondary | ICD-10-CM | POA: Diagnosis not present

## 2016-02-03 DIAGNOSIS — K219 Gastro-esophageal reflux disease without esophagitis: Secondary | ICD-10-CM | POA: Diagnosis not present

## 2016-02-03 DIAGNOSIS — I129 Hypertensive chronic kidney disease with stage 1 through stage 4 chronic kidney disease, or unspecified chronic kidney disease: Secondary | ICD-10-CM | POA: Diagnosis not present

## 2016-02-03 DIAGNOSIS — R21 Rash and other nonspecific skin eruption: Secondary | ICD-10-CM | POA: Diagnosis not present

## 2016-02-03 DIAGNOSIS — N189 Chronic kidney disease, unspecified: Secondary | ICD-10-CM | POA: Diagnosis not present

## 2016-02-03 DIAGNOSIS — F419 Anxiety disorder, unspecified: Secondary | ICD-10-CM | POA: Diagnosis not present

## 2016-02-03 DIAGNOSIS — Z7951 Long term (current) use of inhaled steroids: Secondary | ICD-10-CM | POA: Diagnosis not present

## 2016-02-03 DIAGNOSIS — Z79899 Other long term (current) drug therapy: Secondary | ICD-10-CM | POA: Diagnosis not present

## 2016-02-03 DIAGNOSIS — K91850 Pouchitis: Secondary | ICD-10-CM | POA: Diagnosis not present

## 2016-02-03 DIAGNOSIS — Z87891 Personal history of nicotine dependence: Secondary | ICD-10-CM | POA: Diagnosis not present

## 2016-02-03 DIAGNOSIS — Z9049 Acquired absence of other specified parts of digestive tract: Secondary | ICD-10-CM | POA: Diagnosis not present

## 2016-02-03 DIAGNOSIS — K6289 Other specified diseases of anus and rectum: Secondary | ICD-10-CM | POA: Diagnosis not present

## 2016-02-16 ENCOUNTER — Telehealth: Payer: Self-pay

## 2016-02-16 NOTE — Telephone Encounter (Signed)
Budesonide EC 3 mg caps. Does pt need to continue this RX. When written it was a 6 week course. Please advise.

## 2016-02-16 NOTE — Telephone Encounter (Signed)
Did I give her this or did her surgeon at [facility]? I have not seen her yet for follow up in the clinic, can you help coordinate this so I can review her additional workup at [facility] that she completed? I usually treat with budesonide for 6 weeks but would clarify who ordered this for her as I don't see notation regarding this in records.

## 2016-02-17 NOTE — Telephone Encounter (Signed)
Ok thanks... I would still like to see her in a routine follow up if you can help coordinate. thanks

## 2016-02-17 NOTE — Telephone Encounter (Signed)
Contacted pt and she states that she did not request the refills. Last Rx was sent by Dr Olevia Perches. Pt indicates she does not need the refills on Budesonide and to disregard the request.

## 2016-02-18 NOTE — Telephone Encounter (Signed)
Pt states she has just had her colon done with [facility]. She is awaiting the path report and further results. She states she will call to schedule a follow up after the work up at [facility] is complete.

## 2016-02-19 NOTE — Telephone Encounter (Signed)
I think you meant to send this to [facility]

## 2016-02-20 ENCOUNTER — Telehealth: Payer: Self-pay | Admitting: Family Medicine

## 2016-02-20 DIAGNOSIS — N183 Chronic kidney disease, stage 3 (moderate): Secondary | ICD-10-CM | POA: Diagnosis not present

## 2016-02-20 DIAGNOSIS — M109 Gout, unspecified: Secondary | ICD-10-CM | POA: Diagnosis not present

## 2016-02-20 DIAGNOSIS — D509 Iron deficiency anemia, unspecified: Secondary | ICD-10-CM | POA: Diagnosis not present

## 2016-02-20 DIAGNOSIS — N179 Acute kidney failure, unspecified: Secondary | ICD-10-CM | POA: Diagnosis not present

## 2016-02-20 NOTE — Telephone Encounter (Signed)
°  Relationship to patient: Self Can be reached: [phone]  [facility]:  [id number] [place] [phone](Phone) [phone](Fax)         Reason for call: Request refill on Wellbutrin but needs the one that is not time released because it is not digesting.

## 2016-02-20 NOTE — Telephone Encounter (Signed)
Too many side effects with IR

## 2016-02-20 NOTE — Telephone Encounter (Signed)
Please review and advise if the change is appropriate.    KP

## 2016-02-23 NOTE — Telephone Encounter (Signed)
She is on both but she is requesting something to replace the Wellbutrin. Please review med's.     KP

## 2016-02-23 NOTE — Telephone Encounter (Signed)
She is already on max dose of cymbalta---- she probably should see psychiatry for help with med

## 2016-02-23 NOTE — Telephone Encounter (Signed)
I know she is on both--  She is already on the max dose of cymbalta so I can not inc that and there are too many side effects with IR wellbutrin----  There are atypicals we can try but Im afraid we may have the same issue--- we may need a psychiatrist to help with this.

## 2016-02-23 NOTE — Telephone Encounter (Signed)
She said is there is something that she can take with the Cymbalta because the Wellbutrin is coming through her complete since she does not have a colon. Please advise     KP

## 2016-02-24 NOTE — Telephone Encounter (Signed)
Spoke with patient and she voiced understanding, said she will call [place].    KP

## 2016-02-25 ENCOUNTER — Telehealth: Payer: Self-pay | Admitting: Gastroenterology

## 2016-02-25 NOTE — Telephone Encounter (Signed)
Please review [facility] records in care everywhere. Pt is not scheduled for a office visit yet, she was awaiting the completed [facility] work up. Thank you,

## 2016-02-27 ENCOUNTER — Other Ambulatory Visit: Payer: Self-pay | Admitting: Cardiovascular Disease

## 2016-03-02 NOTE — Telephone Encounter (Signed)
She has been seen by Colorectal surgery at [facility] who had her see GI at [facility] in their IBD clinic. She should call them back to schedule flex sig and for further recommendations as outlined in their clinic note from March.

## 2016-03-02 NOTE — Telephone Encounter (Signed)
I have reviewed her clinic visit with [facility] from 12/01/14 - they had recommended a course of tinidazole 500mg  BID for 2 weeks and had set her up for a repeat flex sig but I don't see any reports from the flex sig. Can you clarify with her if she had this done? Thanks

## 2016-03-02 NOTE — Telephone Encounter (Signed)
Pt states she did complete the Tinidazole treatment as prescribed by [facility]. She has not had the repeat flex. I did advise her to call and find out how to have this repeat test completed. [name] states that her pouch is still inflamed and iratated and is requesting more antibiotic treatment. Please advise.

## 2016-03-03 MED ORDER — METRONIDAZOLE 500 MG PO TABS: 500.0000 mg | ORAL_TABLET | Freq: Two times a day (BID) | ORAL | Status: DC

## 2016-03-03 NOTE — Telephone Encounter (Signed)
Left message to return call 

## 2016-03-03 NOTE — Addendum Note (Signed)
Addended by: Elias Else on: 03/03/2016 02:29 PM   Modules accepted: Orders

## 2016-03-03 NOTE — Telephone Encounter (Signed)
Pt returned call. Informed her to contact [facility] for completed work up. RX also sent to pharmacy.

## 2016-03-03 NOTE — Telephone Encounter (Signed)
She has responded well to Flagyl in the past, we can give her 528m BID for 2 weeks if she wishes to try this. She does need to follow up with [facility] who has not yet completed their second opinion. Thanks

## 2016-03-03 NOTE — Telephone Encounter (Signed)
Do you want to give her any antibiotic treatment for the pouchitis?

## 2016-03-05 NOTE — Telephone Encounter (Signed)
Pt states she called [facility] and they informed her to have the repeat flex sig done here in [place]. She feels like she is just being pushed around from here to [facility], We told her to follow up at [facility] and they told her to come here. She has a follow up O/V with [place] on 04-29-2016. She has started the Flagyl you gave her on 03-03-2016. Please advise. Thank you

## 2016-03-05 NOTE — Telephone Encounter (Signed)
Okay, we can perform the flex sig here, that is not a problem. Can you please assist in scheduling? In their clinic note they had mentioned they were going to perform her follow up exam which is why I had mentioned it earlier. Thanks

## 2016-03-08 NOTE — Telephone Encounter (Signed)
Pt contacted. Scheduled a pre-vist for 03-12-16 and the flex is booked for 03-23-2016

## 2016-03-12 ENCOUNTER — Ambulatory Visit (AMBULATORY_SURGERY_CENTER): Payer: Self-pay | Admitting: *Deleted

## 2016-03-12 ENCOUNTER — Encounter: Payer: Self-pay | Admitting: Gastroenterology

## 2016-03-12 VITALS — Ht 63.0 in | Wt 177.0 lb

## 2016-03-12 DIAGNOSIS — K91850 Pouchitis: Secondary | ICD-10-CM

## 2016-03-12 NOTE — Progress Notes (Signed)
Patient denies any allergies to egg or soy products. Patient denies complications with anesthesia/sedation.  Patient denies oxygen use at home and denies diet medications. Emmi instructions for colonoscopy explained but patient denied.

## 2016-03-22 ENCOUNTER — Other Ambulatory Visit: Payer: Self-pay | Admitting: Cardiovascular Disease

## 2016-03-23 ENCOUNTER — Telehealth: Payer: Self-pay | Admitting: Gastroenterology

## 2016-03-23 ENCOUNTER — Encounter: Payer: Self-pay | Admitting: Gastroenterology

## 2016-03-23 ENCOUNTER — Ambulatory Visit: Payer: Medicare Other | Admitting: Gastroenterology

## 2016-03-23 VITALS — BP 150/93 | HR 79 | Temp 97.8°F | Resp 12 | Ht 64.0 in | Wt 171.0 lb

## 2016-03-23 DIAGNOSIS — Z86010 Personal history of colonic polyps: Secondary | ICD-10-CM | POA: Diagnosis not present

## 2016-03-23 DIAGNOSIS — K91850 Pouchitis: Secondary | ICD-10-CM

## 2016-03-23 MED ORDER — SODIUM CHLORIDE 0.9 % IV SOLN: 500.0000 mL | INTRAVENOUS | Status: DC

## 2016-03-23 NOTE — Telephone Encounter (Signed)
Patient was scheduled for flex sig today per recommendations from [place] [facility] to perform further biopsies. When the patient called in to discuss her symptoms she did not think this had been done yet and it was not listed in her records from [place]. 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 No CMV. 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 Chronic pouchitis. Her flex sig was cancelled as I don't think she needs it given it was done on 5/30. She just completed a course of Flagyl for pouchitis and reported feeling better.   Can you please schedule her a clinic appointment with me for the upcoming few weeks if she doesn't have one yet? We need to discuss long term options for management. Thanks

## 2016-03-23 NOTE — Telephone Encounter (Signed)
Let's keep that appointment, if something opens up sooner we can contact her. She reported doing okay right now after treatment with Flagyl, I asked her to call with recurrence of symptoms in the interim

## 2016-03-23 NOTE — Progress Notes (Signed)
Pt requested IV in right AC.

## 2016-03-23 NOTE — Telephone Encounter (Signed)
She has an OV scheduled on 04/29/16 with MD. Does she need an earlier OV? If so please, advise which day since no appointments available.

## 2016-03-23 NOTE — Progress Notes (Signed)
Patient came in for flex sig which was recommended by [facility]. When the patient called in to discuss her symptoms she had told us this was not done yet and we did not see it in her records from [facility], thus she was scheduled. Today upon reviewing her [facility] records again, a flex sig was uploaded and shows she had a pouchoscopy with biopsies showing chronic active ileitis. This seems concerning for Crohns of the pouch. I don't think we need to repeat flex sig today. She was just treated with a course of Flagyl for pouchitis and she reported symptomatic benefit and she is doing better at present. We cancelled her procedure for today, to be rescheduled for the clinic to discuss long term treatment options. She agreed

## 2016-03-23 NOTE — Progress Notes (Signed)
Procedure cancelled.  See Dr Doyne Keel note.  IV d/c'd in admitting.  Pt will plan to follow up in office.

## 2016-03-23 NOTE — Progress Notes (Signed)
Pt procedure not done. Dr Havery Moros reviewed chart and procedure done at [facility] recently  No medications given  To PreOp

## 2016-04-01 ENCOUNTER — Other Ambulatory Visit: Payer: Self-pay

## 2016-04-01 NOTE — Telephone Encounter (Signed)
Patient requesting a refill on Metoprolol Succinate she will be out Sunday. Pharmacy [facility] [address] [place] back at [phone]

## 2016-04-01 NOTE — Telephone Encounter (Signed)
She needs to follow up with Cardiology per [place].  KP

## 2016-04-01 NOTE — Telephone Encounter (Signed)
Patient was seen on 01/08/16 and normally get Metoprolol from Dr.Nasher. Please advise    KP

## 2016-04-02 ENCOUNTER — Other Ambulatory Visit (HOSPITAL_COMMUNITY): Payer: Self-pay | Admitting: *Deleted

## 2016-04-02 ENCOUNTER — Telehealth: Payer: Self-pay | Admitting: Cardiovascular Disease

## 2016-04-02 ENCOUNTER — Telehealth: Payer: Self-pay | Admitting: Family Medicine

## 2016-04-02 MED ORDER — METOPROLOL SUCCINATE ER 100 MG PO TB24: ORAL_TABLET | ORAL | 2 refills | Status: DC

## 2016-04-02 NOTE — Telephone Encounter (Addendum)
Relation to YI:RSWN Call back number:[phone] Pharmacy: [id number] [place] [place]  Reason for call:  Patient requesting  A refill metoprolol succinate (TOPROL-XL) 100 MG 24 hr tablet

## 2016-04-02 NOTE — Telephone Encounter (Signed)
Duplicate request.    KP 

## 2016-04-02 NOTE — Telephone Encounter (Signed)
His office sent the script.   KP

## 2016-04-02 NOTE — Telephone Encounter (Signed)
Pt Rx was sent to pt's pharmacy as requested. Confirmation received.

## 2016-04-02 NOTE — Telephone Encounter (Signed)
Follow-up      *STAT* If patient is at the pharmacy, call can be transferred to refill team.   1. Which medications need to be refilled? (please list name of each medication and dose if known) Metoprolol 100 mg po pt takes 1/2 pill daily  2. Which pharmacy/location (including street and city if local pharmacy) is [facility] [address]  3. Do they need a 30 day or 90 day supply? 30 day supple

## 2016-04-02 NOTE — Telephone Encounter (Signed)
If Dr Cathie Olden said we can start filling it then ok to fill for 6 months

## 2016-04-02 NOTE — Telephone Encounter (Signed)
Patient called stating that cardiologist Thayer Headings, MD advised PCP can refill metoprolol succinate (TOPROL-XL) 100 MG 24 hr tablet. Advised patient as per not in chart follow up with cardiologist directly to schedule appointment.

## 2016-04-05 ENCOUNTER — Ambulatory Visit (HOSPITAL_COMMUNITY)
Admission: RE | Admit: 2016-04-05 | Discharge: 2016-04-05 | Disposition: A | Discharge status: Home / Self Care | Payer: Medicare Other | Source: Ambulatory Visit | Attending: Nephrology | Admitting: Nephrology

## 2016-04-05 DIAGNOSIS — D509 Iron deficiency anemia, unspecified: Secondary | ICD-10-CM | POA: Insufficient documentation

## 2016-04-05 MED ORDER — SODIUM CHLORIDE 0.9 % IV SOLN
510.0000 mg | INTRAVENOUS | Status: DC
  Administered 2016-04-05: 510 mg via INTRAVENOUS
  Filled 2016-04-05: qty 17

## 2016-04-12 ENCOUNTER — Ambulatory Visit (HOSPITAL_COMMUNITY)
Admission: RE | Admit: 2016-04-12 | Discharge: 2016-04-12 | Disposition: A | Discharge status: Home / Self Care | Payer: Medicare Other | Source: Ambulatory Visit | Attending: Nephrology | Admitting: Nephrology

## 2016-04-12 DIAGNOSIS — D509 Iron deficiency anemia, unspecified: Secondary | ICD-10-CM | POA: Diagnosis not present

## 2016-04-12 MED ORDER — SODIUM CHLORIDE 0.9 % IV SOLN
510.0000 mg | INTRAVENOUS | Status: DC
  Administered 2016-04-12: 510 mg via INTRAVENOUS
  Filled 2016-04-12: qty 17

## 2016-04-29 ENCOUNTER — Other Ambulatory Visit (INDEPENDENT_AMBULATORY_CARE_PROVIDER_SITE_OTHER): Payer: Medicare Other

## 2016-04-29 ENCOUNTER — Ambulatory Visit: Payer: Medicare Other | Admitting: Gastroenterology

## 2016-04-29 ENCOUNTER — Ambulatory Visit (INDEPENDENT_AMBULATORY_CARE_PROVIDER_SITE_OTHER): Payer: Medicare Other | Admitting: Gastroenterology

## 2016-04-29 ENCOUNTER — Encounter: Payer: Self-pay | Admitting: Gastroenterology

## 2016-04-29 VITALS — BP 134/90 | HR 96 | Ht 63.25 in | Wt 176.5 lb

## 2016-04-29 DIAGNOSIS — K6389 Other specified diseases of intestine: Secondary | ICD-10-CM | POA: Diagnosis not present

## 2016-04-29 DIAGNOSIS — K529 Noninfective gastroenteritis and colitis, unspecified: Secondary | ICD-10-CM

## 2016-04-29 DIAGNOSIS — K91850 Pouchitis: Secondary | ICD-10-CM

## 2016-04-29 LAB — COMPREHENSIVE METABOLIC PANEL
ALT: 11 U/L (ref 0–35)
AST: 15 U/L (ref 0–37)
Albumin: 4 g/dL (ref 3.5–5.2)
Alkaline Phosphatase: 68 U/L (ref 39–117)
BUN: 31 mg/dL — ABNORMAL HIGH (ref 6–23)
CO2: 28 mEq/L (ref 19–32)
Calcium: 8.9 mg/dL (ref 8.4–10.5)
Chloride: 101 mEq/L (ref 96–112)
Creatinine, Ser: 2.54 mg/dL — ABNORMAL HIGH (ref 0.40–1.20)
GFR: 20.34 mL/min — ABNORMAL LOW (ref >60.00)
Glucose, Bld: 94 mg/dL (ref 70–99)
Potassium: 3.5 mEq/L (ref 3.5–5.1)
Sodium: 136 mEq/L (ref 135–145)
Total Bilirubin: 0.3 mg/dL (ref 0.2–1.2)
Total Protein: 7.7 g/dL (ref 6.0–8.3)

## 2016-04-29 LAB — CBC WITH DIFFERENTIAL/PLATELET
Basophils Absolute: 0 10*3/uL (ref 0.0–0.1)
Basophils Relative: 0.3 % (ref 0.0–3.0)
Eosinophils Absolute: 0.4 10*3/uL (ref 0.0–0.7)
Eosinophils Relative: 3.4 % (ref 0.0–5.0)
HCT: 33.7 % — ABNORMAL LOW (ref 36.0–46.0)
Hemoglobin: 11.4 g/dL — ABNORMAL LOW (ref 12.0–15.0)
Lymphocytes Relative: 37.5 % (ref 12.0–46.0)
Lymphs Abs: 4.3 10*3/uL — ABNORMAL HIGH (ref 0.7–4.0)
MCHC: 33.9 g/dL (ref 30.0–36.0)
MCV: 93.9 fl (ref 78.0–100.0)
Monocytes Absolute: 1.1 10*3/uL — ABNORMAL HIGH (ref 0.1–1.0)
Monocytes Relative: 9.3 % (ref 3.0–12.0)
Neutro Abs: 5.6 10*3/uL (ref 1.4–7.7)
Neutrophils Relative %: 49.5 % (ref 43.0–77.0)
Platelets: 411 10*3/uL — ABNORMAL HIGH (ref 150.0–400.0)
RBC: 3.59 Mil/uL — ABNORMAL LOW (ref 3.87–5.11)
RDW: 15.1 % (ref 11.5–15.5)
WBC: 11.4 10*3/uL — ABNORMAL HIGH (ref 4.0–10.5)

## 2016-04-29 LAB — C-REACTIVE PROTEIN: CRP: 0.7 mg/dL (ref 0.5–20.0)

## 2016-04-29 LAB — SEDIMENTATION RATE: Sed Rate: 56 mm/hr — ABNORMAL HIGH (ref 0–30)

## 2016-04-29 MED ORDER — RIFAXIMIN 550 MG PO TABS: 550.0000 mg | ORAL_TABLET | Freq: Three times a day (TID) | ORAL | 0 refills | Status: AC

## 2016-04-29 MED ORDER — RIFAXIMIN 550 MG PO TABS: 550.0000 mg | ORAL_TABLET | Freq: Three times a day (TID) | ORAL | 0 refills | Status: DC

## 2016-04-29 NOTE — Progress Notes (Signed)
HPI :  IBD history: 60 year old white female with IBD (? ulcerative colitis vs Crohn's) since 1990. 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 She reports she has had chronic symptoms since the time of her surgery. She has variable symptoms over time. She has diarrhea with urgency at times, at minimum she has a BM every hour or so, she reports previously this was more frequent. She reports ongoing nocturnal symptoms which she states she has had longstanding. She has blood in the stools periodically. She has abdominal pains and cramping. No fevers. She thinks her weight has been stable, no weight loss. She reports she was never able to be put in remission prior to colectomy. She reports only being exposed to steroids prior to her surgery. Post-operatively, she was on Remicade for 3-4 years but she is not sure if it helped too much. She has been on 6MP remotely but did not work for her. She was on Humira but also did not put her into remission and she developed high antibody titers and it was stopped. She has never been exposed to [facility]. She reports multiple courses of antibiotics with cipro and flagyl with some benefit.  She has been on budesonider with ? Relief. She denies NSAID use. She has a history of CKD. Father and daughter have UC. No tobacco use.  SINCE LAST VISIT:  Flex sig done 08/29/2015 - showed deep ulcerations at an angulated turn entering the pouch, and at the anastomosis with the proximal small bowel, other than those areas the pouch looked okay. Biopsies were nonspecific. I referred her to [facility] to ensure no ischemic changes. Unclear if she had chronic pouchitis or Crohns of the pouch otherwise. We had placed her on VSL#3 and has been on trials of  Antibiotics, Canasa. Her Humira was stopped in October after she developed high levels of anti-drug antibodies.   She was seen at [facility] seen by Dr. Emelda Fear and had another flex sig done. They felt  ischemic injury was less likely given the chronicity of her symptoms. They had recommended a course of tinidazole '500mg'$  BID for 2 weeks. She had biopsies of the pre pouch ileum which were normal. 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 Path is c/w "IBD / pouchitis". No CMV. IgG4 stains negative. They had discussed potentially treating her with Rifaximin for possible SIBO.   Patient reports she continues to have some good day / bad days. She continues to have some leakage / incontinence with mucous. She is having roughly 10 BMs per day or so at baseline. She is having some lower abdominal discomfort periodically. She feels as though she has "pouchitis again". She reports the most recent course of flagyl made her feel better in March. She is not taking VSL#3 but taking a similar probiotic. She reports after having antibiotics she has felt well / better reliably.       Past Medical History:  Diagnosis Date  . Abdominal pain    Hx  . Anal stenosis   . Anemia   . Anxiety   . Asthma    patient does not have inhaler  . Blood in stool    Hx  . Blood in urine    Hx  . De Quervain's tenosynovitis   . Depression   . Difficulty urinating    Hx  . Easy bruising   . Esophagitis   .  Fainting    History - resolved - due to dehydration  . Fatigue    Hx  . Fibroid    Hx  . Gastric polyp   . GERD (gastroesophageal reflux disease)   . 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 Hemorrhoids, external   . Hemorrhoids, internal   . Herpes, genital    vaginal treated 07/05/12 and pt states is resolved  . History of cervical dysplasia   . History of small bowel obstruction   . Hyperlipidemia    currently no meds  . Hyperparathyroidism   . Hypertension   . Hypokalemia    Hx  . Obesity   . Ovarian cyst   . Poor dental hygiene   . Pulmonary nodule, right    right upper lobe  . Rectal bleeding    Hx  .  Rectal pain    Hx  . Renal insufficiency    CKD - stage 2  . RLS (restless legs syndrome)    no meds  . Ulcerative colitis   . Visual disturbance    wears glasses  . Weakness generalized    Hx - patient denies generalized weakness  . Wears dentures    upper only     Past Surgical History:  Procedure Laterality Date  . ANAL DILATION    . CERVICAL BIOPSY  W/ LOOP ELECTRODE EXCISION    . CHOLECYSTECTOMY    . COLONOSCOPY     [name]  . fatty tumor removed from back     X 2  . HEMORRHOID SURGERY    . ILEOSTOMY CLOSURE    . RESTORATIVE PROCTOCOLECTOMY     with insertion of ileoanal J Pouch with loop ileostomy  . TOTAL ABDOMINAL HYSTERECTOMY  1998   TAH/LSO  . TUBAL LIGATION    . UPPER GASTROINTESTINAL ENDOSCOPY     [name]   Family History  Problem Relation Age of Onset  . Heart disease Father   . Ulcerative colitis Father   . Hypertension Father   . Hypertension Mother   . Heart disease Mother   . Ulcerative colitis Daughter   . Irritable bowel syndrome      grandchildren  . Diabetes Sister   . Cancer Sister     uterine  . Cancer Maternal Uncle     LUNG  . Colon cancer Neg Hx   . Esophageal cancer Neg Hx   . Stomach cancer Neg Hx   . Rectal cancer Neg Hx    Social History  Substance Use Topics  . Smoking status: Former Smoker    Packs/day: 1.00    Years: 4.00    Types: Cigarettes    Quit date: 05/21/1979  . Smokeless tobacco: Never Used  . Alcohol use No   Current Outpatient Prescriptions  Medication Sig Dispense Refill  . BIOTIN PO Take by mouth daily.    . calcitRIOL (ROCALTROL) 0.25 MCG capsule Take 0.25 mcg by mouth every other day. Mon, Wed, Fri    . DULoxetine (CYMBALTA) 60 MG capsule TAKE 2 TABLETS BY MOUTH ONCE DAILY 180 capsule 1  . HYDROcodone-acetaminophen (NORCO/VICODIN) 5-325 MG tablet Take 2 tablets by mouth every 4 (four) hours as needed for moderate pain or severe pain. 90 tablet 0  . metoprolol succinate (TOPROL-XL) 100 MG 24 hr tablet  TAKE 1/2 TABLET BY MOUTH EVERY DAY 15 tablet 2  . Multiple Vitamins-Minerals (MULTIVITAL) CHEW Chew by mouth. Reported on 03/23/2016    .  Probiotic Product (VSL#3) CAPS Take 1 tablet daily by mouth 60 capsule 0  . ranitidine (ZANTAC) 150 MG tablet Take one po BID 180 tablet 0  . temazepam (RESTORIL) 30 MG capsule Take 1 capsule (30 mg total) by mouth at bedtime. 30 capsule 3  . ULORIC 40 MG tablet TAKE 1 TABLET (40 MG TOTAL) BY MOUTH DAILY. 30 tablet 11   No current facility-administered medications for this visit.    Allergies  Allergen Reactions  . Morphine Other (See Comments)    Gives me crazy dreams  . Sulfonamide Derivatives Itching, Swelling and Rash     Review of Systems: All systems reviewed and negative except where noted in HPI.    Lab Results  Component Value Date   WBC 11.4 (H) 04/29/2016   HGB 11.4 (L) 04/29/2016   HCT 33.7 (L) 04/29/2016   MCV 93.9 04/29/2016   PLT 411.0 (H) 04/29/2016   Lab Results  Component Value Date   CREATININE 2.54 (H) 04/29/2016   BUN 31 (H) 04/29/2016   NA 136 04/29/2016   K 3.5 04/29/2016   CL 101 04/29/2016   CO2 28 04/29/2016    Lab Results  Component Value Date   ALT 11 04/29/2016   AST 15 04/29/2016   ALKPHOS 68 04/29/2016   BILITOT 0.3 04/29/2016      Physical Exam: BP 134/90 (BP Location: Left Arm, Patient Position: Sitting, Cuff Size: Normal)   Pulse 96   Ht 5' 3.25" (1.607 m)   Wt 176 lb 8 oz (80.1 kg)   BMI 31.02 kg/m  Constitutional: Pleasant,well-developed, female in no acute distress. HEENT: Normocephalic and atraumatic. Conjunctivae are normal. No scleral icterus. Neck supple.  Cardiovascular: Normal rate, regular rhythm.  Pulmonary/chest: Effort normal and breath sounds normal. No wheezing, rales or rhonchi. Abdominal: Soft, nondistended, nontender. Bowel sounds active throughout. There are no masses palpable. No hepatomegaly. Extremities: no edema Lymphadenopathy: No cervical adenopathy  noted. Neurological: Alert and oriented to person place and time. Skin: Skin is warm and dry. No rashes noted. Psychiatric: Normal mood and affect. Behavior is normal.   ASSESSMENT AND PLAN: 60 y/o female with history of IBD (UC vs. Crohns) s/p colectomy with IPAA in 1990s, with chronic diarrhea / symptoms since that time. She has failed Remicade and Humira, and 6MP. I do think she has chronic pouchitis given her reliable response to antibiotics, but unclear if she has Crohns of the pouch / rectal remnant. Most recent biopsies at [facility] seem to suggest IBD, but I need to discuss her case with Dr. Emelda Fear of [facility] to help clarify given pathology samples are there. She has tested negative for infectious causes, and at this time does not appear ischemic. She thinks her symptoms are flaring again, and will try a course of Rifaximin to treat both pouchitis and for possible SIBO and see if this helps. I will otherwise obtain labs - CBC, CMP, ESR, CRP, fecal calprotectin.   I will get back to her once I hear back from Dr. Emelda Fear. She agreed with the plan.   Lamar Cellar, MD [facility] Pager [phone]

## 2016-04-29 NOTE — Patient Instructions (Addendum)
Your physician has requested that you go to the basement for lab work before leaving today.  We have sent your demographic information and a prescription for Xifaxan to [facility]. This pharmacy is able to get medication approved through insurance and get you the lowest copay possible. If you have not heard from them within 1 week, please call our office at [phone] to let us know.

## 2016-05-05 LAB — CALPROTECTIN, FECAL: Calprotectin, Fecal: 171 ug/g — ABNORMAL HIGH (ref 0–120)

## 2016-05-11 ENCOUNTER — Telehealth: Payer: Self-pay | Admitting: Gastroenterology

## 2016-05-11 MED ORDER — RIFAXIMIN 550 MG PO TABS: 550.0000 mg | ORAL_TABLET | Freq: Three times a day (TID) | ORAL | 0 refills | Status: DC

## 2016-05-11 NOTE — Telephone Encounter (Signed)
[facility]an [facility] request.

## 2016-05-13 ENCOUNTER — Other Ambulatory Visit: Payer: Self-pay | Admitting: Family Medicine

## 2016-05-13 DIAGNOSIS — G4700 Insomnia, unspecified: Secondary | ICD-10-CM

## 2016-05-14 NOTE — Telephone Encounter (Signed)
Last seen and filled 01/08/16 #30 with 3rf    Please advise     KP

## 2016-05-18 ENCOUNTER — Telehealth: Payer: Self-pay | Admitting: Gastroenterology

## 2016-05-18 DIAGNOSIS — K50119 Crohn's disease of large intestine with unspecified complications: Secondary | ICD-10-CM

## 2016-05-18 NOTE — Telephone Encounter (Signed)
I called [name] after I received a call from Dr. Emelda Fear about her case. I discussed her case at length with Dr. Emelda Fear, hard to say if this is refractory / chronic pouchitis or Crohns, but will treat it the same way for now and recommended to escalate immunosuppresive options. She has been on Remicade previously, and then Humira with high antibody levels. Options are Entyvio at this time or Stelara. It was recommended to try Entyvio and combine with 6MP to prevent antibody formation.   Patty, can you assist with seeing how much [name] would be for her with her insurance and get back to me?   I would also like her to go to the lab to have TPMT enzyme drawn and I have placed this order. If she can afford it will plan on starting Entyvio in combination with low dose 6MP. She will continue Rifaximin for now, just picked up the prescription.  Thanks

## 2016-05-19 NOTE — Telephone Encounter (Signed)
Thanks Patty, yes it would be good to have the quantiferon testing done. Thanks

## 2016-05-19 NOTE — Telephone Encounter (Signed)
Form faxed to [facility] to determine if [name] is covered by insurance.  Also, Dr Duanne Guess it looks like last Quant gold was 03/04/15 do you want her to repeat now?  Please advise.

## 2016-05-20 NOTE — Telephone Encounter (Signed)
Patient notified she will come for labs at her convenience

## 2016-05-21 ENCOUNTER — Other Ambulatory Visit: Payer: Medicare Other

## 2016-05-21 DIAGNOSIS — K50119 Crohn's disease of large intestine with unspecified complications: Secondary | ICD-10-CM | POA: Diagnosis not present

## 2016-05-21 DIAGNOSIS — K50118 Crohn's disease of large intestine with other complication: Secondary | ICD-10-CM | POA: Diagnosis not present

## 2016-05-21 DIAGNOSIS — K51819 Other ulcerative colitis with unspecified complications: Secondary | ICD-10-CM | POA: Diagnosis not present

## 2016-05-21 DIAGNOSIS — R1032 Left lower quadrant pain: Secondary | ICD-10-CM | POA: Diagnosis not present

## 2016-05-24 LAB — QUANTIFERON TB GOLD ASSAY (BLOOD)
Interferon Gamma Release Assay: NEGATIVE
Mitogen-Nil: 3.61 IU/mL
Quantiferon Nil Value: 0.1 IU/mL
Quantiferon Tb Ag Minus Nil Value: <0 IU/mL

## 2016-05-25 ENCOUNTER — Other Ambulatory Visit: Payer: Self-pay

## 2016-05-25 DIAGNOSIS — K50119 Crohn's disease of large intestine with unspecified complications: Secondary | ICD-10-CM

## 2016-05-26 ENCOUNTER — Other Ambulatory Visit: Payer: Self-pay

## 2016-05-26 DIAGNOSIS — K50118 Crohn's disease of large intestine with other complication: Secondary | ICD-10-CM

## 2016-05-28 LAB — THIOPURINE METHYLTRANSFERASE (TPMT), RBC: Thiopurine Methyltransferase, RBC: 17 nmol/hr/mL RBC

## 2016-05-31 ENCOUNTER — Other Ambulatory Visit: Payer: Self-pay

## 2016-05-31 DIAGNOSIS — K51819 Other ulcerative colitis with unspecified complications: Secondary | ICD-10-CM

## 2016-05-31 MED ORDER — MERCAPTOPURINE 50 MG PO TABS: 50.0000 mg | ORAL_TABLET | Freq: Every day | ORAL | 0 refills | Status: DC

## 2016-06-02 DIAGNOSIS — D509 Iron deficiency anemia, unspecified: Secondary | ICD-10-CM | POA: Diagnosis not present

## 2016-06-02 DIAGNOSIS — N179 Acute kidney failure, unspecified: Secondary | ICD-10-CM | POA: Diagnosis not present

## 2016-06-02 DIAGNOSIS — M109 Gout, unspecified: Secondary | ICD-10-CM | POA: Diagnosis not present

## 2016-06-02 DIAGNOSIS — N183 Chronic kidney disease, stage 3 (moderate): Secondary | ICD-10-CM | POA: Diagnosis not present

## 2016-06-02 DIAGNOSIS — Z23 Encounter for immunization: Secondary | ICD-10-CM | POA: Diagnosis not present

## 2016-06-03 ENCOUNTER — Other Ambulatory Visit: Payer: Self-pay | Admitting: Cardiovascular Disease

## 2016-06-04 ENCOUNTER — Other Ambulatory Visit: Payer: Self-pay | Admitting: *Deleted

## 2016-06-04 MED ORDER — METOPROLOL SUCCINATE ER 100 MG PO TB24: ORAL_TABLET | ORAL | 0 refills | Status: DC

## 2016-06-07 ENCOUNTER — Other Ambulatory Visit: Payer: Self-pay

## 2016-06-07 MED ORDER — METOPROLOL SUCCINATE ER 100 MG PO TB24: ORAL_TABLET | ORAL | 0 refills | Status: DC

## 2016-06-14 ENCOUNTER — Encounter: Payer: Self-pay | Admitting: Cardiovascular Disease

## 2016-06-14 ENCOUNTER — Other Ambulatory Visit: Payer: Self-pay

## 2016-06-14 ENCOUNTER — Encounter (HOSPITAL_COMMUNITY)
Admission: RE | Admit: 2016-06-14 | Discharge: 2016-06-14 | Disposition: A | Discharge status: Home / Self Care | Payer: Medicare Other | Source: Ambulatory Visit | Attending: Gastroenterology | Admitting: Gastroenterology

## 2016-06-14 DIAGNOSIS — K50118 Crohn's disease of large intestine with other complication: Secondary | ICD-10-CM | POA: Diagnosis not present

## 2016-06-14 DIAGNOSIS — K5010 Crohn's disease of large intestine without complications: Secondary | ICD-10-CM

## 2016-06-14 LAB — CBC WITH DIFFERENTIAL/PLATELET
Basophils Absolute: 0 10*3/uL (ref 0.0–0.1)
Basophils Relative: 0 %
Eosinophils Absolute: 0.3 10*3/uL (ref 0.0–0.7)
Eosinophils Relative: 5 %
HCT: 32.5 % — ABNORMAL LOW (ref 36.0–46.0)
Hemoglobin: 10.8 g/dL — ABNORMAL LOW (ref 12.0–15.0)
Lymphocytes Relative: 35 %
Lymphs Abs: 1.8 10*3/uL (ref 0.7–4.0)
MCH: 32 pg (ref 26.0–34.0)
MCHC: 33.2 g/dL (ref 30.0–36.0)
MCV: 96.4 fL (ref 78.0–100.0)
Monocytes Absolute: 0 10*3/uL — ABNORMAL LOW (ref 0.1–1.0)
Monocytes Relative: 1 %
Neutro Abs: 3.1 10*3/uL (ref 1.7–7.7)
Neutrophils Relative %: 59 %
Platelets: 248 10*3/uL (ref 150–400)
RBC: 3.37 MIL/uL — ABNORMAL LOW (ref 3.87–5.11)
RDW: 13.9 % (ref 11.5–15.5)
WBC: 5.2 10*3/uL (ref 4.0–10.5)

## 2016-06-14 LAB — COMPREHENSIVE METABOLIC PANEL
ALT: 19 U/L (ref 14–54)
AST: 22 U/L (ref 15–41)
Albumin: 3.9 g/dL (ref 3.5–5.0)
Alkaline Phosphatase: 58 U/L (ref 38–126)
Anion gap: 8 (ref 5–15)
BUN: 32 mg/dL — ABNORMAL HIGH (ref 6–20)
CO2: 23 mmol/L (ref 22–32)
Calcium: 9.1 mg/dL (ref 8.9–10.3)
Chloride: 103 mmol/L (ref 101–111)
Creatinine, Ser: 2.11 mg/dL — ABNORMAL HIGH (ref 0.44–1.00)
GFR calc Af Amer: 28 mL/min — ABNORMAL LOW (ref >60)
GFR calc non Af Amer: 24 mL/min — ABNORMAL LOW (ref >60)
Glucose, Bld: 131 mg/dL — ABNORMAL HIGH (ref 65–99)
Potassium: 3.5 mmol/L (ref 3.5–5.1)
Sodium: 134 mmol/L — ABNORMAL LOW (ref 135–145)
Total Bilirubin: 0.4 mg/dL (ref 0.3–1.2)
Total Protein: 8.2 g/dL — ABNORMAL HIGH (ref 6.5–8.1)

## 2016-06-14 MED ORDER — SODIUM CHLORIDE 0.9 % IV SOLN
INTRAVENOUS | Status: DC
  Administered 2016-06-14: 10:00:00 via INTRAVENOUS

## 2016-06-14 MED ORDER — VEDOLIZUMAB 300 MG IV SOLR
300.0000 mg | Freq: Once | INTRAVENOUS | Status: AC
  Administered 2016-06-14: 300 mg via INTRAVENOUS
  Filled 2016-06-14: qty 5

## 2016-06-14 NOTE — Discharge Instructions (Signed)
Vedolizumab injection solution °What is this medicine? °VEDOLIZUMAB (Ve doe LIZ you mab) is used to treat ulcerative colitis and Crohn's disease in adult patients. °This medicine may be used for other purposes; ask your health care provider or pharmacist if you have questions. °What should I tell my health care provider before I take this medicine? °They need to know if you have any of these conditions: °-diabetes °-hepatitis B or history of hepatitis B infection °-HIV or AIDS °-immune system problems °-infection or history of infections °-liver disease °-recently received or scheduled to receive a vaccine °-scheduled to have surgery °-tuberculosis, a positive skin test for tuberculosis or have recently been in close contact with someone who has tuberculosis °- °an unusual or allergic reaction to vedolizumab, other medicines, foods, dyes, or preservatives °-pregnant or trying to get pregnant °-breast-feeding °How should I use this medicine? °This medicine is for infusion into a vein. It is given by a health care professional in a hospital or clinic setting. °A special MedGuide will be given to you by the pharmacist with each prescription and refill. Be sure to read this information carefully each time. °Talk to your pediatrician regarding the use of this medicine in children. This medicine is not approved for use in children. °Overdosage: If you think you have taken too much of this medicine contact a poison control center or emergency room at once. °NOTE: This medicine is only for you. Do not share this medicine with others. °What if I miss a dose? °It is important not to miss your dose. Call your doctor or health care professional if you are unable to keep an appointment. °What may interact with this medicine? °-steroid medicines like prednisone or cortisone °-TNF-alpha inhibitors like natalizumab, adalimumab, and infliximab °-vaccines °This list may not describe all possible interactions. Give your health care  provider a list of all the medicines, herbs, non-prescription drugs, or dietary supplements you use. Also tell them if you smoke, drink alcohol, or use illegal drugs. Some items may interact with your medicine. °What should I watch for while using this medicine? °Your condition will be monitored carefully while you are receiving this medicine. Visit your doctor for regular check ups. Tell your doctor or healthcare professional if your symptoms do not start to get better or if they get worse. °Stay away from people who are sick. Call your doctor or health care professional for advice if you get a fever, chills or sore throat, or other symptoms of a cold or flu. Do not treat yourself. °In some patients, this medicine may cause a serious brain infection that may cause death. If you have any problems seeing, thinking, speaking, walking, or standing, tell your doctor right away. If you cannot reach your doctor, get urgent medical care. °What side effects may I notice from receiving this medicine? °Side effects that you should report to your doctor or health care professional as soon as possible: °-allergic reactions like skin rash, itching or hives, swelling of the face, lips, or tongue °-breathing problems °-changes in vision °-chest pain °-dark urine °-depression, feelings of sadness °-dizziness °-general ill feeling or flu-like symptoms °-irregular, missed, or painful menstrual periods °-light-colored stools °-loss of appetite, nausea °-muscle weakness °-problems with balance, talking, or walking °-right upper belly pain °-unusually weak or tired °-yellowing of the eyes or skin °Side effects that usually do not require medical attention (Report these to your doctor or health care professional if they continue or are bothersome.): °-aches, pains °-headache °-stomach upset °-tiredness °  This list may not describe all possible side effects. Call your doctor for medical advice about side effects. You may report side  effects to FDA at 1-800-FDA-1088. °Where should I keep my medicine? °This drug is given in a hospital or clinic and will not be stored at home. °NOTE: This sheet is a summary. It may not cover all possible information. If you have questions about this medicine, talk to your doctor, pharmacist, or health care provider. °  °© 2016, [employer]. (2013-01-25 12:32:57) ° °

## 2016-06-25 ENCOUNTER — Encounter: Payer: Self-pay | Admitting: Cardiovascular Disease

## 2016-06-25 ENCOUNTER — Encounter: Payer: Medicare Other | Admitting: Gynecology

## 2016-06-25 ENCOUNTER — Ambulatory Visit (INDEPENDENT_AMBULATORY_CARE_PROVIDER_SITE_OTHER): Payer: Medicare Other | Admitting: Cardiovascular Disease

## 2016-06-25 VITALS — BP 130/80 | HR 94 | Ht 64.0 in | Wt 177.8 lb

## 2016-06-25 DIAGNOSIS — I10 Essential (primary) hypertension: Secondary | ICD-10-CM

## 2016-06-25 MED ORDER — METOPROLOL SUCCINATE ER 50 MG PO TB24: 50.0000 mg | ORAL_TABLET | Freq: Every day | ORAL | 3 refills | Status: DC

## 2016-06-25 NOTE — Patient Instructions (Signed)
Medication Instructions:  Your physician recommends that you continue on your current medications as directed. Please refer to the Current Medication list given to you today.   Labwork: None Ordered   Testing/Procedures: None Ordered   Follow-Up: Your physician wants you to follow-up in: 1 year with Dr. Nahser.  You will receive a reminder letter in the mail two months in advance. If you don't receive a letter, please call our office to schedule the follow-up appointment.   If you need a refill on your cardiac medications before your next appointment, please call your pharmacy.   Thank you for choosing [facility]! Filimon Miranda, RN [phone]    

## 2016-06-25 NOTE — Progress Notes (Signed)
Cardiology Office Note   Date:  06/25/2016   ID:  [name], [name] [birth year], MRN [id number]  PCP:  Ann Held, DO  Cardiologist:   Mertie Moores, MD   Chief Complaint  Patient presents with  . Hypertension   1. Hypertension 2. Ulcerative colitis- s/p total colectomy. 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 Hyperlipidemia 4. Chest pain-stress Myoview study in 2008 was normal   [name] is seen today for a follow up visit. She is seen for Dr. Acie Fredrickson. She has not been seen in about 2 years. She has HTN, ulcerative colitis with extensive GI issues, HLD and chest pain. Remote Myoview back in 2008 was satisfactory.   She comes in today. She is here alone. She is doing well. She says her chest pain is unchanged over the last several years and really nothing that worries her. She has no exertional symptoms. She is not short of breath. She needs her Metoprolol refilled. No shortness of breath. Not as active and her weight is up. She continues to have significant GI issues. Still with some stool leakage and has an internal J pouch. She sees nephrololgy for an elevated creatinine and is due to have her physical soon. She really has no complaint and thinks she is doing well.   Jan. 30, 2015;  [name] is doing OK. We have given her metoprolol for a resting tachycardia. No problems with tachycardia or hypertension at this point.    November 22, 2014:   [name] is a 60 y.o. female who presents for follow up of her HTN . No further episodes of CP  Oct. 20, 2017 Is now on Envacona for  Has a skin infection on her right thumb and thinks its due to the lowered immune system  No CP or dyspnea.  Does not get any regular exercise She does not have a colon.   Has lots of GI issues     Past Medical History:  Diagnosis Date  . Abdominal pain    Hx  . Anal stenosis   . Anemia   . Anxiety   . Asthma    patient does not have inhaler  . Blood in stool    Hx  .  Blood in urine    Hx  . De Quervain's tenosynovitis   . Depression   . Difficulty urinating    Hx  . Easy bruising   . Esophagitis   . Fainting    History - resolved - due to dehydration  . Fatigue    Hx  . Fibroid    Hx  . Gastric polyp   . GERD (gastroesophageal reflux disease)   . 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 Hemorrhoids, external   . Hemorrhoids, internal   . Herpes, genital    vaginal treated 07/05/12 and pt states is resolved  . History of cervical dysplasia   . History of small bowel obstruction   . Hyperlipidemia    currently no meds  . Hyperparathyroidism   . Hypertension   . Hypokalemia    Hx  . Obesity   . Ovarian cyst   . Poor dental hygiene   . Pulmonary nodule, right    right upper lobe  . Rectal bleeding    Hx  . Rectal pain    Hx  . Renal insufficiency    CKD - stage 2  .  RLS (restless legs syndrome)    no meds  . Ulcerative colitis   . Visual disturbance    wears glasses  . Weakness generalized    Hx - patient denies generalized weakness  . Wears dentures    upper only    Past Surgical History:  Procedure Laterality Date  . ANAL DILATION    . CERVICAL BIOPSY  W/ LOOP ELECTRODE EXCISION    . CHOLECYSTECTOMY    . COLONOSCOPY     [name]  . fatty tumor removed from back     X 2  . HEMORRHOID SURGERY    . ILEOSTOMY CLOSURE    . RESTORATIVE PROCTOCOLECTOMY     with insertion of ileoanal J Pouch with loop ileostomy  . TOTAL ABDOMINAL HYSTERECTOMY  1998   TAH/LSO  . TUBAL LIGATION    . UPPER GASTROINTESTINAL ENDOSCOPY     [name]     Current Outpatient Prescriptions  Medication Sig Dispense Refill  . BIOTIN PO Take by mouth daily.    . calcitRIOL (ROCALTROL) 0.25 MCG capsule Take 0.25 mcg by mouth every other day. Mon, Wed, Fri    . HYDROcodone-acetaminophen (NORCO/VICODIN) 5-325 MG tablet Take 2 tablets by mouth every 4 (four) hours as needed for moderate pain or severe pain. 90 tablet 0  .  mercaptopurine (PURINETHOL) 50 MG tablet Take 1 tablet (50 mg total) by mouth daily. Give on an empty stomach 1 hour before or 2 hours after meals. Caution: Chemotherapy. 30 tablet 0  . metoprolol succinate (TOPROL-XL) 100 MG 24 hr tablet TAKE 1/2 TABLET BY MOUTH EVERY DAY 15 tablet 0  . Multiple Vitamins-Minerals (MULTIVITAL) CHEW Chew by mouth. Reported on 03/23/2016    . Probiotic Product (VSL#3) CAPS Take 1 tablet daily by mouth 60 capsule 0  . ranitidine (ZANTAC) 150 MG tablet Take one po BID 180 tablet 0  . rifaximin (XIFAXAN) 550 MG TABS tablet Take 1 tablet (550 mg total) by mouth 3 (three) times daily. 42 tablet 0  . simethicone (MYLICON) 80 MG chewable tablet Chew 80 mg by mouth every 6 (six) hours as needed for flatulence.    . temazepam (RESTORIL) 30 MG capsule TAKE ONE CAPSULE BY MOUTH AT BEDTIME 30 capsule 1  . ULORIC 40 MG tablet TAKE 1 TABLET (40 MG TOTAL) BY MOUTH DAILY. 30 tablet 11  . DULoxetine (CYMBALTA) 60 MG capsule TAKE 2 TABLETS BY MOUTH ONCE DAILY (Patient taking differently: Take 60 mg by mouth daily. TAKE 2 TABLETS BY MOUTH ONCE DAILY) 180 capsule 1   No current facility-administered medications for this visit.     Allergies:   Morphine and Sulfonamide derivatives    Social History:  The patient  reports that she quit smoking about 37 years ago. Her smoking use included Cigarettes. She has a 4.00 pack-year smoking history. She has never used smokeless tobacco. She reports that she does not drink alcohol or use drugs.   Family History:  The patient's family history includes Cancer in her maternal uncle and sister; Diabetes in her sister; Heart disease in her father and mother; Hypertension in her father and mother; Ulcerative colitis in her daughter and father.    ROS:  Please see the history of present illness.    Review of Systems: Constitutional:  denies fever, chills, diaphoresis, appetite change and fatigue.  HEENT: denies photophobia, eye pain, redness,  hearing loss, ear pain, congestion, sore throat, rhinorrhea, sneezing, neck pain, neck stiffness and tinnitus.  Respiratory: denies SOB, DOE, cough, chest  tightness, and wheezing.  Cardiovascular: denies chest pain, palpitations and leg swelling.  Gastrointestinal: denies nausea, vomiting, abdominal pain, diarrhea, constipation, blood in stool.  Genitourinary: denies dysuria, urgency, frequency, hematuria, flank pain and difficulty urinating.  Musculoskeletal: denies  myalgias, back pain, joint swelling, arthralgias and gait problem.   Skin: denies pallor, rash and wound.  Neurological: denies dizziness, seizures, syncope, weakness, light-headedness, numbness and headaches.   Hematological: denies adenopathy, easy bruising, personal or family bleeding history.  Psychiatric/ Behavioral: denies suicidal ideation, mood changes, confusion, nervousness, sleep disturbance and agitation.       All other systems are reviewed and negative.    PHYSICAL EXAM: VS:  BP 130/80 (BP Location: Left Arm, Patient Position: Sitting, Cuff Size: Normal)   Pulse 94   Ht 5' 4"  (1.626 m)   Wt 177 lb 12.8 oz (80.6 kg)   BMI 30.52 kg/m  , BMI Body mass index is 30.52 kg/m. GEN: Well nourished, well developed, in no acute distress  HEENT: normal  Neck: no JVD, carotid bruits, or masses Cardiac: RRR; no murmurs, rubs, or gallops,no edema  Respiratory:  clear to auscultation bilaterally, normal work of breathing GI: soft, nontender, nondistended, + BS MS: no deformity or atrophy  Skin: warm and dry, no rash Neuro:  Strength and sensation are intact Psych: normal   EKG:  EKG is ordered today. The ekg ordered today demonstrates NSR at 95,  normal ECG    Recent Labs: 06/14/2016: ALT 19; BUN 32; Creatinine, Ser 2.11; Hemoglobin 10.8; Platelets 248; Potassium 3.5; Sodium 134    Lipid Panel    Component Value Date/Time   CHOL 199 11/22/2014 1017   TRIG 156.0 (H) 11/22/2014 1017   TRIG 161 (H)  09/19/2006 0940   HDL 45.80 11/22/2014 1017   CHOLHDL 4 11/22/2014 1017   VLDL 31.2 11/22/2014 1017   LDLCALC 122 (H) 11/22/2014 1017   LDLDIRECT 127.0 09/19/2006 0940      Wt Readings from Last 3 Encounters:  06/25/16 177 lb 12.8 oz (80.6 kg)  06/14/16 176 lb 12.8 oz (80.2 kg)  04/29/16 176 lb 8 oz (80.1 kg)      Other studies Reviewed: Additional studies/ records that were reviewed today include: . Review of the above records demonstrates:    ASSESSMENT AND PLAN:  1. Hypertension - blood pressures well-controlled. Continue current dose of metoprolol.  2. Ulcerative colitis- s/p total colectomy. Has a J pouch leading to her rectum  3. Hyperlipidemia - her last lipid level was in 2009. We'll check her fasting lipids today. She may need to be started on a statin. I've encouraged her to work on a better diet, exercise, and weight loss program.  4. Chest pain-stress Myoview study in 2008 was normal   Current medicines are reviewed at length with the patient today.  The patient does not have concerns regarding medicines.  The following changes have been made:  no change  Labs/ tests ordered today include:  No orders of the defined types were placed in this encounter.    Disposition:   FU with me in 1 year     Signed, Mertie Moores, MD  06/25/2016 8:15 AM    [facility] [address], [place], [place]  [address] Phone: [phone]; Fax: [phone]

## 2016-06-26 ENCOUNTER — Other Ambulatory Visit: Payer: Self-pay | Admitting: Gastroenterology

## 2016-06-28 ENCOUNTER — Encounter (HOSPITAL_COMMUNITY)
Admission: RE | Admit: 2016-06-28 | Discharge: 2016-06-28 | Disposition: A | Discharge status: Home / Self Care | Payer: Medicare Other | Source: Ambulatory Visit | Attending: Gastroenterology | Admitting: Gastroenterology

## 2016-06-28 ENCOUNTER — Encounter (HOSPITAL_COMMUNITY): Payer: Self-pay

## 2016-06-28 DIAGNOSIS — K50118 Crohn's disease of large intestine with other complication: Secondary | ICD-10-CM | POA: Diagnosis not present

## 2016-06-28 MED ORDER — SODIUM CHLORIDE 0.9 % IV SOLN
INTRAVENOUS | Status: DC
  Administered 2016-06-28: 11:00:00 via INTRAVENOUS

## 2016-06-28 MED ORDER — VEDOLIZUMAB 300 MG IV SOLR
300.0000 mg | Freq: Once | INTRAVENOUS | Status: AC
  Administered 2016-06-28: 300 mg via INTRAVENOUS
  Filled 2016-06-28: qty 5

## 2016-06-28 NOTE — Telephone Encounter (Signed)
Can this pt have a refill of Mercaptopurine?

## 2016-06-29 NOTE — Addendum Note (Signed)
Addended by: Emmaline Life on: 06/29/2016 09:56 AM   Modules accepted: Orders

## 2016-06-29 NOTE — Telephone Encounter (Signed)
Pt is requesting refill of Mercaptopurine. Refill?

## 2016-07-02 ENCOUNTER — Ambulatory Visit (INDEPENDENT_AMBULATORY_CARE_PROVIDER_SITE_OTHER): Payer: Medicare Other | Admitting: Gynecology

## 2016-07-02 ENCOUNTER — Encounter: Payer: Self-pay | Admitting: Gynecology

## 2016-07-02 VITALS — BP 132/86 | Ht 63.0 in | Wt 178.0 lb

## 2016-07-02 DIAGNOSIS — A6000 Herpesviral infection of urogenital system, unspecified: Secondary | ICD-10-CM

## 2016-07-02 DIAGNOSIS — Z01411 Encounter for gynecological examination (general) (routine) with abnormal findings: Secondary | ICD-10-CM | POA: Diagnosis not present

## 2016-07-02 DIAGNOSIS — Z780 Asymptomatic menopausal state: Secondary | ICD-10-CM | POA: Diagnosis not present

## 2016-07-02 DIAGNOSIS — N83201 Unspecified ovarian cyst, right side: Secondary | ICD-10-CM

## 2016-07-02 MED ORDER — VALACYCLOVIR HCL 1 G PO TABS: 1000.0000 mg | ORAL_TABLET | Freq: Every day | ORAL | 1 refills | Status: DC

## 2016-07-02 NOTE — Progress Notes (Signed)
[name] 08-30-1956 [id number]   History:    60 y.o.  for annual gyn exam with complaint of a vulvar lesion which she feels that it could be a recurrent episode of herpes genitalia. Patient has a history of a stable right ovarian cyst that has been followed with ultrasounds and CA 125 as follows: In 2013 when she was being evaluated by her urologist for nephrolithiasis a CT scan had demonstrated a right ovarian cyst measured 2.8 cm. Patient with prior history of total abdominal hysterectomy with left salpingo-oophorectomy. The ultrasound done in office on 02/14/2013 demonstrated the cyst to measure 3.7 x 3.8 x 2.5 cm irregular-shaped thinwall with scattered low-level echoes. She had a normal CA 125 and had received Depo-Provera 150 mg IM and on followup ultrasound July 2013 demonstrated a cyst measuring 3.5 x 2.4 x 3.7 cm thin echo free totally avascular.  Patient has had history of ulcerative colitis since 1990 and has been followed by Dr. Maurene Capes.She underwent a total colectomy and ileoproctostomy in 1998 for intractable disease. She has a history of pouchitis. Her last flexible sigmoidoscopy in September 2012 showed active inflammatory bowel disease at the anastomosis at 20 cm.  She has been followed for renal insufficiency by Dr Servando Salina.   She had received an additional Depo-Provera shot in January 2014 the ultrasound demonstrated a cyst gotten smaller measuring 29 x 21 x 31 mm ever size 2.7 mm.CA 125 in 2000 at a value of 7.3 and on repeat in 2014 had a value of 8.1.   Follow-up ultrasound October 2015 demonstrated the following: Absent uterus and left adnexa. Right ovary thickwalled follicle measuring 7 x 7 mm no change. Thinwall echo free avascular cyst measuring 29 x 18 x 22 mm average size 23 mm decreased in size from previous scan.  Patient with no prior history of any abnormal Pap smears  Ultrasound October 2016: Absent uterus and left adnexa. Vaginal cuff normal. Right  ovary continued presence of a thinwall echo-free avascular cyst measuring 24 x 17 x 25 mm average size 22 mm. No change from prior scan of 2015. Left adnexa normal no fluid in the cul-de-sac.  Patient with no past history of any abnormal Pap smear. Her last bone density study was normal in 2014. Although there was statistically significant decrease in bone mineralization of the AP spine but left and right femoral neck were stable     Gynecologic History No LMP recorded. Patient is postmenopausal. Contraception: status post hysterectomy Last Pap: 2012. Results were: normal Last mammogram: 2017. Results were: normal  Obstetric History OB History  Gravida Para Term Preterm AB Living  4 2 2   2 2   SAB TAB Ectopic Multiple Live Births  2       2    # Outcome Date GA Lbr Len/2nd Weight Sex Delivery Anes PTL Lv  4 SAB           3 SAB           2 Term     F Vag-Spont  N LIV  1 Term     M Vag-Spont  N LIV       ROS: A ROS was performed and pertinent positives and negatives are included in the history.  GENERAL: No fevers or chills. HEENT: No change in vision, no earache, sore throat or sinus congestion. NECK: No pain or stiffness. CARDIOVASCULAR: No chest pain or pressure. No palpitations. PULMONARY: No shortness of breath, cough or wheeze.  GASTROINTESTINAL: No abdominal pain, nausea, vomiting or diarrhea, melena or bright red blood per rectum. GENITOURINARY: No urinary frequency, urgency, hesitancy or dysuria. MUSCULOSKELETAL: No joint or muscle pain, no back pain, no recent trauma. DERMATOLOGIC: No rash, no itching, no lesions. ENDOCRINE: No polyuria, polydipsia, no heat or cold intolerance. No recent change in weight. HEMATOLOGICAL: No anemia or easy bruising or bleeding. NEUROLOGIC: No headache, seizures, numbness, tingling or weakness. PSYCHIATRIC: No depression, no loss of interest in normal activity or change in sleep pattern.     Exam: chaperone present  BP 132/86   Ht 5\' 3"  (1.6  m)   Wt 178 lb (80.7 kg)   BMI 31.53 kg/m   Body mass index is 31.53 kg/m.  General appearance : Well developed well nourished female. No acute distress HEENT: Eyes: no retinal hemorrhage or exudates,  Neck supple, trachea midline, no carotid bruits, no thyroidmegaly Lungs: Clear to auscultation, no rhonchi or wheezes, or rib retractions  Heart: Regular rate and rhythm, no murmurs or gallops Breast:Examined in sitting and supine position were symmetrical in appearance, no palpable masses or tenderness,  no skin retraction, no nipple inversion, no nipple discharge, no skin discoloration, no axillary or supraclavicular lymphadenopathy Abdomen: no palpable masses or tenderness, no rebound or guarding Extremities: no edema or skin discoloration or tenderness  Pelvic: Superior portion of right labia majora classical herpetic outbreak noted  Bartholin, Urethra, Skene Glands: Within normal limits             Vagina: No gross lesions or discharge  Cervix: Absent  Uterus  absent  Adnexa  Without masses or tenderness  Anus and perineum  normal   Rectovaginal  normal sphincter tone without palpated masses or tenderness             Hemoccult PCP provides     Assessment/Plan:  60 y.o. female for annual exam has been followed by [facility] GI department. Her PCP has been doing her blood work and her nephrologist give her the flu vaccine recently. Pap smear was done today. Patient to schedule a bone density study here in the next couple weeks. Patient with clinical evidence of herpes genitalia outbreak will be treated with Valtrex thousand milligrams daily for 7 days. She'll return back to the office in 2 weeks for follow-up ultrasound on the right ovarian cyst that she never followed up. We'll go ahead and check her CA 125 today.   Terrance Mass MD, 12:12 PM 07/02/2016

## 2016-07-02 NOTE — Patient Instructions (Addendum)
CA-125 Tumor Marker Test WHY AM I HAVING THIS TEST? This test is used to check the level of cancer antigen 125 (CA-125) in your blood. The CA-125 tumor marker test can be helpful in detecting ovarian cancer. The test is only performed if you are considered at high risk for ovarian cancer. Your health care provider may recommend this test if:  You have a strong family history of ovarian cancer.  You have a breast cancer antigen (BRCA) genetic defect. If you have already been diagnosed with ovarian cancer, your health care provider may use this test to help identify the extent of the disease and to monitor your response to treatment. WHAT KIND OF SAMPLE IS TAKEN? A blood sample is required for this test. It is usually collected by inserting a needle into a vein. HOW DO I PREPARE FOR THE TEST? There is no preparation required for this test. WHAT ARE THE REFERENCE RANGES? Reference ranges are considered healthy ranges established after testing a large group of healthy people. Reference ranges may vary among different people, labs, and hospitals. It is your responsibility to obtain your test results. Ask the lab or department performing the test when and how you will get your results. The reference range for this test is 0-35 units/mL or less than 35 kunits/L (SI units). WHAT DO THE RESULTS MEAN? Increased levels of CA-125 may indicate:  Certain types of cancer, including:  Ovarian cancer.  Pancreatic cancer.  Colon cancer.  Lung cancer.  Breast cancer.  Lymphoma.  Noncancerous (benign) disorders, including:  Cirrhosis.  Pregnancy.  Endometriosis.  Pancreatitis.  Pelvic inflammatory disease (PID). Talk with your health care provider to discuss your results, treatment options, and if necessary, the need for more tests. Talk with your health care provider if you have any questions about your results.   This information is not intended to replace advice given to you by your  health care provider. Make sure you discuss any questions you have with your health care provider.   Document Released: 09/14/2004 Document Revised: 09/13/2014 Document Reviewed: 01/10/2014 Elsevier Interactive Patient Education 2016 Else[employer] Ovarian Cyst An ovarian cyst is a fluid-filled sac that forms on an ovary. The ovaries are small organs that produce eggs in women. Various types of cysts can form on the ovaries. Most are not cancerous. Many do not cause problems, and they often go away on their own. Some may cause symptoms and require treatment. Common types of ovarian cysts include:  Functional cysts--These cysts may occur every month during the menstrual cycle. This is normal. The cysts usually go away with the next menstrual cycle if the woman does not get pregnant. Usually, there are no symptoms with a functional cyst.  Endometrioma cysts--These cysts form from the tissue that lines the uterus. They are also called "chocolate cysts" because they become filled with blood that turns brown. This type of cyst can cause pain in the lower abdomen during intercourse and with your menstrual period.  Cystadenoma cysts--This type develops from the cells on the outside of the ovary. These cysts can get very big and cause lower abdomen pain and pain with intercourse. This type of cyst can twist on itself, cut off its blood supply, and cause severe pain. It can also easily rupture and cause a lot of pain.  Dermoid cysts--This type of cyst is sometimes found in both ovaries. These cysts may contain different kinds of body tissue, such as skin, teeth, hair, or cartilage. They usually do not cause  symptoms unless they get very big.  Theca lutein cysts--These cysts occur when too much of a certain hormone (human chorionic gonadotropin) is produced and overstimulates the ovaries to produce an egg. This is most common after procedures used to assist with the conception of a baby (in vitro  fertilization). CAUSES   Fertility drugs can cause a condition in which multiple large cysts are formed on the ovaries. This is called ovarian hyperstimulation syndrome.  A condition called polycystic ovary syndrome can cause hormonal imbalances that can lead to nonfunctional ovarian cysts. SIGNS AND SYMPTOMS  Many ovarian cysts do not cause symptoms. If symptoms are present, they may include:  Pelvic pain or pressure.  Pain in the lower abdomen.  Pain during sexual intercourse.  Increasing girth (swelling) of the abdomen.  Abnormal menstrual periods.  Increasing pain with menstrual periods.  Stopping having menstrual periods without being pregnant. DIAGNOSIS  These cysts are commonly found during a routine or annual pelvic exam. Tests may be ordered to find out more about the cyst. These tests may include:  Ultrasound.  X-ray of the pelvis.  CT scan.  MRI.  Blood tests. TREATMENT  Many ovarian cysts go away on their own without treatment. Your health care provider may want to check your cyst regularly for 2-3 months to see if it changes. For women in menopause, it is particularly important to monitor a cyst closely because of the higher rate of ovarian cancer in menopausal women. When treatment is needed, it may include any of the following:  A procedure to drain the cyst (aspiration). This may be done using a long needle and ultrasound. It can also be done through a laparoscopic procedure. This involves using a thin, lighted tube with a tiny camera on the end (laparoscope) inserted through a small incision.  Surgery to remove the whole cyst. This may be done using laparoscopic surgery or an open surgery involving a larger incision in the lower abdomen.  Hormone treatment or birth control pills. These methods are sometimes used to help dissolve a cyst. HOME CARE INSTRUCTIONS   Only take over-the-counter or prescription medicines as directed by your health care  provider.  Follow up with your health care provider as directed.  Get regular pelvic exams and Pap tests. SEEK MEDICAL CARE IF:   Your periods are late, irregular, or painful, or they stop.  Your pelvic pain or abdominal pain does not go away.  Your abdomen becomes larger or swollen.  You have pressure on your bladder or trouble emptying your bladder completely.  You have pain during sexual intercourse.  You have feelings of fullness, pressure, or discomfort in your stomach.  You lose weight for no apparent reason.  You feel generally ill.  You become constipated.  You lose your appetite.  You develop acne.  You have an increase in body and facial hair.  You are gaining weight, without changing your exercise and eating habits.  You think you are pregnant. SEEK IMMEDIATE MEDICAL CARE IF:   You have increasing abdominal pain.  You feel sick to your stomach (nauseous), and you throw up (vomit).  You develop a fever that comes on suddenly.  You have abdominal pain during a bowel movement.  Your menstrual periods become heavier than usual. MAKE SURE YOU:  Understand these instructions.  Will watch your condition.  Will get help right away if you are not doing well or get worse.   This information is not intended to replace advice given to  you by your health care provider. Make sure you discuss any questions you have with your health care provider.   Document Released: 08/23/2005 Document Revised: 08/28/2013 Document Reviewed: 04/30/2013 Elsevier Interactive Patient Education [employer].

## 2016-07-03 LAB — CA 125: CA 125: 9 U/mL (ref <35)

## 2016-07-05 ENCOUNTER — Telehealth: Payer: Self-pay | Admitting: *Deleted

## 2016-07-05 NOTE — Telephone Encounter (Signed)
Pt was prescribed Valtrex,she has some concerns about the interaction between valtrex and her kidneys if its safe to take. Will route to DR. Toney Rakes

## 2016-07-05 NOTE — Telephone Encounter (Signed)
Contacted pt she understands to contact nephrologist

## 2016-07-05 NOTE — Telephone Encounter (Signed)
Ask her to check with her nephrologist if he would be okay if she took Valtrex 500 mg twice a day for 7 days only

## 2016-07-13 ENCOUNTER — Telehealth: Payer: Self-pay | Admitting: Gastroenterology

## 2016-07-14 NOTE — Telephone Encounter (Signed)
Patient called wanted to report that she is experiencing fatigue after receiving the Entyvio. She felt this way after first dose too, it did get better about the time she was due for the 2nd dose. Explained that this is one of the common side effects listed .Patient reports also headache for a day or two following dosage, but this resolves quickly. Next dose due on 11/20, then she states it will be every 8 weeks dosing. Advised patient to rest, make sure she is staying hydrated and that I would let Dr. Havery Moros know of her call.

## 2016-07-14 NOTE — Telephone Encounter (Signed)
Spoke to patient and she wants to try to continue on the medication, just wanted to let us know. Patient reassured.

## 2016-07-14 NOTE — Telephone Encounter (Signed)
Thanks for letting me know. Hopefully over time this resolves, and that her dosing will be spread out in the future. This is a very specialized medication and she has limited treatment options so we want to continue with this therapy if no serious intolerances. If she feels worse over time she should let me know. Her recent labs looked good but if she feels worse we can consider repeating. Thanks

## 2016-07-19 ENCOUNTER — Telehealth: Payer: Self-pay | Admitting: Family Medicine

## 2016-07-19 ENCOUNTER — Other Ambulatory Visit: Payer: Medicare Other

## 2016-07-19 ENCOUNTER — Ambulatory Visit: Payer: Medicare Other | Admitting: Gynecology

## 2016-07-19 DIAGNOSIS — G4700 Insomnia, unspecified: Secondary | ICD-10-CM

## 2016-07-20 NOTE — Telephone Encounter (Signed)
Last OV: 01/08/16 Next OV: was due in August and pt is past due with no future appts scheduled Last RX: 05/14/16, #30 x 1 refill  Request denied. Pt needs office visit. Please call pt to schedule appt with Dr Carollee Herter soon. Thanks!

## 2016-07-22 ENCOUNTER — Other Ambulatory Visit: Payer: Self-pay | Admitting: Family Medicine

## 2016-07-22 ENCOUNTER — Telehealth: Payer: Self-pay

## 2016-07-22 DIAGNOSIS — F5101 Primary insomnia: Secondary | ICD-10-CM

## 2016-07-22 MED ORDER — TEMAZEPAM 30 MG PO CAPS: 30.0000 mg | ORAL_CAPSULE | Freq: Every day | ORAL | 1 refills | Status: DC

## 2016-07-22 NOTE — Telephone Encounter (Signed)
Patient is scheduled for next Tuesday she is asking for a few pills to hold her over until her appointment she has not been able to sleep for well for two days.   Please advise [phone] pharmacy is [facility] on [address]

## 2016-07-22 NOTE — Telephone Encounter (Signed)
I sent an rx in

## 2016-07-22 NOTE — Telephone Encounter (Signed)
Patient is scheduled for next Tuesday she is asking for a few pills to hold her over until her appointment she has not been able to sleep for well for two days. Please advise.

## 2016-07-23 DIAGNOSIS — L920 Granuloma annulare: Secondary | ICD-10-CM | POA: Diagnosis not present

## 2016-07-23 DIAGNOSIS — L820 Inflamed seborrheic keratosis: Secondary | ICD-10-CM | POA: Diagnosis not present

## 2016-07-23 NOTE — Telephone Encounter (Signed)
Forwarded to PCP for advise. LB

## 2016-07-26 ENCOUNTER — Encounter (HOSPITAL_COMMUNITY): Payer: Self-pay

## 2016-07-26 ENCOUNTER — Encounter (HOSPITAL_COMMUNITY)
Admission: RE | Admit: 2016-07-26 | Discharge: 2016-07-26 | Disposition: A | Discharge status: Home / Self Care | Payer: Medicare Other | Source: Ambulatory Visit | Attending: Gastroenterology | Admitting: Gastroenterology

## 2016-07-26 DIAGNOSIS — K50118 Crohn's disease of large intestine with other complication: Secondary | ICD-10-CM | POA: Diagnosis not present

## 2016-07-26 MED ORDER — SODIUM CHLORIDE 0.9 % IV SOLN
INTRAVENOUS | Status: DC
  Administered 2016-07-26: 10:00:00 via INTRAVENOUS

## 2016-07-26 MED ORDER — VEDOLIZUMAB 300 MG IV SOLR
300.0000 mg | Freq: Once | INTRAVENOUS | Status: AC
  Administered 2016-07-26: 300 mg via INTRAVENOUS
  Filled 2016-07-26: qty 5

## 2016-07-27 ENCOUNTER — Ambulatory Visit (INDEPENDENT_AMBULATORY_CARE_PROVIDER_SITE_OTHER): Payer: Medicare Other | Admitting: Family Medicine

## 2016-07-27 ENCOUNTER — Encounter: Payer: Self-pay | Admitting: Family Medicine

## 2016-07-27 DIAGNOSIS — M10071 Idiopathic gout, right ankle and foot: Secondary | ICD-10-CM

## 2016-07-27 DIAGNOSIS — F5101 Primary insomnia: Secondary | ICD-10-CM | POA: Diagnosis not present

## 2016-07-27 MED ORDER — TEMAZEPAM 30 MG PO CAPS: 30.0000 mg | ORAL_CAPSULE | Freq: Every day | ORAL | 1 refills | Status: DC

## 2016-07-27 MED ORDER — HYDROCODONE-ACETAMINOPHEN 5-325 MG PO TABS: 2.0000 | ORAL_TABLET | ORAL | 0 refills | Status: DC | PRN

## 2016-07-27 NOTE — Progress Notes (Signed)
Patient ID: [name], female    DOB: 1955/12/15  Age: 60 y.o. MRN: [id number]    Subjective:  Subjective  HPI [name] presents for f/u and refills.  No new complaints  Review of Systems  Constitutional: Negative for appetite change, diaphoresis, fatigue and unexpected weight change.  Eyes: Negative for pain, redness and visual disturbance.  Respiratory: Negative for cough, chest tightness, shortness of breath and wheezing.   Cardiovascular: Negative for chest pain, palpitations and leg swelling.  Endocrine: Negative for cold intolerance, heat intolerance, polydipsia, polyphagia and polyuria.  Genitourinary: Negative for difficulty urinating, dysuria and frequency.  Neurological: Negative for dizziness, light-headedness, numbness and headaches.    History Past Medical History:  Diagnosis Date  . Abdominal pain    Hx  . Anal stenosis   . Anemia   . Anxiety   . Asthma    patient does not have inhaler  . Blood in stool    Hx  . Blood in urine    Hx  . De Quervain's tenosynovitis   . Depression   . Difficulty urinating    Hx  . Easy bruising   . Esophagitis   . Fainting    History - resolved - due to dehydration  . Fatigue    Hx  . Fibroid    Hx  . Gastric polyp   . GERD (gastroesophageal reflux disease)   . 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 Hemorrhoids, external   . Hemorrhoids, internal   . Herpes, genital    vaginal treated 07/05/12 and pt states is resolved  . History of cervical dysplasia   . History of small bowel obstruction   . Hyperlipidemia    currently no meds  . Hyperparathyroidism   . Hypertension   . Hypokalemia    Hx  . Obesity   . Ovarian cyst   . Poor dental hygiene   . Pulmonary nodule, right    right upper lobe  . Rectal bleeding    Hx  . Rectal pain    Hx  . Renal insufficiency    CKD - stage 2  . RLS (restless legs syndrome)    no meds  . Ulcerative colitis   . Visual disturbance    wears  glasses  . Weakness generalized    Hx - patient denies generalized weakness  . Wears dentures    upper only    She has a past surgical history that includes Anal dilation; Restorative proctocolectomy; Ileostomy closure; Cholecystectomy; Tubal ligation; Hemorrhoid surgery; fatty tumor removed from back; Total abdominal hysterectomy (1998); Cervical biopsy w/ loop electrode excision; Upper gastrointestinal endoscopy; and Colonoscopy.   Her family history includes Cancer in her maternal uncle and sister; Diabetes in her sister; Heart disease in her father and mother; Hypertension in her father and mother; Ulcerative colitis in her daughter and father.She reports that she quit smoking about 37 years ago. Her smoking use included Cigarettes. She has a 4.00 pack-year smoking history. She has never used smokeless tobacco. She reports that she does not drink alcohol or use drugs.  Current Outpatient Prescriptions on File Prior to Visit  Medication Sig Dispense Refill  . BIOTIN PO Take by mouth daily.    . calcitRIOL (ROCALTROL) 0.25 MCG capsule Take 0.25 mcg by mouth every other day. Mon, Wed, Fri    . DULoxetine (CYMBALTA) 60 MG capsule TAKE 2 TABLETS BY MOUTH ONCE DAILY (Patient  taking differently: Take 60 mg by mouth daily. TAKE 2 TABLETS BY MOUTH ONCE DAILY) 180 capsule 1  . mercaptopurine (PURINETHOL) 50 MG tablet Take 1 tablet (50 mg total) by mouth daily. 90 tablet 0  . metoprolol succinate (TOPROL-XL) 50 MG 24 hr tablet Take 1 tablet (50 mg total) by mouth daily. 90 tablet 3  . Multiple Vitamins-Minerals (MULTIVITAL) CHEW Chew by mouth. Reported on 03/23/2016    . Probiotic Product (VSL#3) CAPS Take 1 tablet daily by mouth 60 capsule 0  . ranitidine (ZANTAC) 150 MG tablet Take one po BID 180 tablet 0  . rifaximin (XIFAXAN) 550 MG TABS tablet Take 1 tablet (550 mg total) by mouth 3 (three) times daily. 42 tablet 0  . simethicone (MYLICON) 80 MG chewable tablet Chew 80 mg by mouth every 6 (six)  hours as needed for flatulence.    Marland Kitchen ULORIC 40 MG tablet TAKE 1 TABLET (40 MG TOTAL) BY MOUTH DAILY. 30 tablet 11  . vedolizumab (ENTYVIO) 300 MG injection Inject into the vein.    . valACYclovir (VALTREX) 1000 MG tablet Take 1 tablet (1,000 mg total) by mouth daily. (Patient not taking: Reported on 07/27/2016) 7 tablet 1   No current facility-administered medications on file prior to visit.      Objective:  Objective  Physical Exam  Constitutional: She is oriented to person, place, and time. She appears well-developed and well-nourished.  HENT:  Head: Normocephalic and atraumatic.  Eyes: Conjunctivae and EOM are normal.  Neck: Normal range of motion. Neck supple. No JVD present. Carotid bruit is not present. No thyromegaly present.  Cardiovascular: Normal rate, regular rhythm and normal heart sounds.   No murmur heard. Pulmonary/Chest: Effort normal and breath sounds normal. No respiratory distress. She has no wheezes. She has no rales. She exhibits no tenderness.  Musculoskeletal: She exhibits no edema.  Neurological: She is alert and oriented to person, place, and time.  Psychiatric: She has a normal mood and affect.  Nursing note and vitals reviewed.  BP 130/80 (BP Location: Left Arm, Patient Position: Sitting, Cuff Size: Normal)   Pulse 87   Temp 98 F (36.7 C) (Oral)   Resp 17   Ht 5' 3"  (1.6 m)   Wt 179 lb 3.2 oz (81.3 kg)   SpO2 97%   BMI 31.74 kg/m  Wt Readings from Last 3 Encounters:  07/27/16 179 lb 3.2 oz (81.3 kg)  07/26/16 178 lb 4 oz (80.9 kg)  07/02/16 178 lb (80.7 kg)     Lab Results  Component Value Date   WBC 5.2 06/14/2016   HGB 10.8 (L) 06/14/2016   HCT 32.5 (L) 06/14/2016   PLT 248 06/14/2016   GLUCOSE 131 (H) 06/14/2016   CHOL 199 11/22/2014   TRIG 156.0 (H) 11/22/2014   HDL 45.80 11/22/2014   LDLDIRECT 127.0 09/19/2006   LDLCALC 122 (H) 11/22/2014   ALT 19 06/14/2016   AST 22 06/14/2016   NA 134 (L) 06/14/2016   K 3.5 06/14/2016   CL  103 06/14/2016   CREATININE 2.11 (H) 06/14/2016   BUN 32 (H) 06/14/2016   CO2 23 06/14/2016   TSH 2.20 02/04/2009    No results found.   Assessment & Plan:  Plan  I am having Ms. Tyler maintain her MULTIVITAL, calcitRIOL, VSL#3, ULORIC, DULoxetine, ranitidine, BIOTIN PO, rifaximin, simethicone, metoprolol succinate, mercaptopurine, vedolizumab, valACYclovir, temazepam, and HYDROcodone-acetaminophen.  Meds ordered this encounter  Medications  . temazepam (RESTORIL) 30 MG capsule    Sig: Take  1 capsule (30 mg total) by mouth at bedtime.    Dispense:  30 capsule    Refill:  1  . HYDROcodone-acetaminophen (NORCO/VICODIN) 5-325 MG tablet    Sig: Take 2 tablets by mouth every 4 (four) hours as needed for moderate pain or severe pain.    Dispense:  90 tablet    Refill:  0    Problem List Items Addressed This Visit      Unprioritized   Gout of foot   Relevant Medications   HYDROcodone-acetaminophen (NORCO/VICODIN) 5-325 MG tablet    Other Visit Diagnoses    Primary insomnia       Relevant Medications   temazepam (RESTORIL) 30 MG capsule    pt needed to give urine for uds and needed refill-- when rx was sent it was sent to wrong pharmacy  Follow-up: Return in about 6 months (around 01/24/2017) for annual exam, fasting.  Ann Held, DO

## 2016-07-27 NOTE — Patient Instructions (Signed)
Insomnia Insomnia is a sleep disorder that makes it difficult to fall asleep or to stay asleep. Insomnia can cause tiredness (fatigue), low energy, difficulty concentrating, mood swings, and poor performance at work or school. There are three different ways to classify insomnia:  Difficulty falling asleep.  Difficulty staying asleep.  Waking up too early in the morning. Any type of insomnia can be long-term (chronic) or short-term (acute). Both are common. Short-term insomnia usually lasts for three months or less. Chronic insomnia occurs at least three times a week for longer than three months. What are the causes? Insomnia may be caused by another condition, situation, or substance, such as:  Anxiety.  Certain medicines.  Gastroesophageal reflux disease (GERD) or other gastrointestinal conditions.  Asthma or other breathing conditions.  Restless legs syndrome, sleep apnea, or other sleep disorders.  Chronic pain.  Menopause. This may include hot flashes.  Stroke.  Abuse of alcohol, tobacco, or illegal drugs.  Depression.  Caffeine.  Neurological disorders, such as Alzheimer disease.  An overactive thyroid (hyperthyroidism). The cause of insomnia may not be known. What increases the risk? Risk factors for insomnia include:  Gender. Women are more commonly affected than men.  Age. Insomnia is more common as you get older.  Stress. This may involve your professional or personal life.  Income. Insomnia is more common in people with lower income.  Lack of exercise.  Irregular work schedule or night shifts.  Traveling between different time zones. What are the signs or symptoms? If you have insomnia, trouble falling asleep or trouble staying asleep is the main symptom. This may lead to other symptoms, such as:  Feeling fatigued.  Feeling nervous about going to sleep.  Not feeling rested in the morning.  Having trouble concentrating.  Feeling irritable,  anxious, or depressed. How is this treated? Treatment for insomnia depends on the cause. If your insomnia is caused by an underlying condition, treatment will focus on addressing the condition. Treatment may also include:  Medicines to help you sleep.  Counseling or therapy.  Lifestyle adjustments. Follow these instructions at home:  Take medicines only as directed by your health care provider.  Keep regular sleeping and waking hours. Avoid naps.  Keep a sleep diary to help you and your health care provider figure out what could be causing your insomnia. Include:  When you sleep.  When you wake up during the night.  How well you sleep.  How rested you feel the next day.  Any side effects of medicines you are taking.  What you eat and drink.  Make your bedroom a comfortable place where it is easy to fall asleep:  Put up shades or special blackout curtains to block light from outside.  Use a white noise machine to block noise.  Keep the temperature cool.  Exercise regularly as directed by your health care provider. Avoid exercising right before bedtime.  Use relaxation techniques to manage stress. Ask your health care provider to suggest some techniques that may work well for you. These may include:  Breathing exercises.  Routines to release muscle tension.  Visualizing peaceful scenes.  Cut back on alcohol, caffeinated beverages, and cigarettes, especially close to bedtime. These can disrupt your sleep.  Do not overeat or eat spicy foods right before bedtime. This can lead to digestive discomfort that can make it hard for you to sleep.  Limit screen use before bedtime. This includes:  Watching TV.  Using your smartphone, tablet, and computer.  Stick to a   routine. This can help you fall asleep faster. Try to do a quiet activity, brush your teeth, and go to bed at the same time each night.  Get out of bed if you are still awake after 15 minutes of trying to  sleep. Keep the lights down, but try reading or doing a quiet activity. When you feel sleepy, go back to bed.  Make sure that you drive carefully. Avoid driving if you feel very sleepy.  Keep all follow-up appointments as directed by your health care provider. This is important. Contact a health care provider if:  You are tired throughout the day or have trouble in your daily routine due to sleepiness.  You continue to have sleep problems or your sleep problems get worse. Get help right away if:  You have serious thoughts about hurting yourself or someone else. This information is not intended to replace advice given to you by your health care provider. Make sure you discuss any questions you have with your health care provider. Document Released: 08/20/2000 Document Revised: 01/23/2016 Document Reviewed: 05/24/2014 Elsevier Interactive Patient Education  2017 [employer] Inc[employer]  

## 2016-07-27 NOTE — Progress Notes (Signed)
Pre visit review using our clinic review tool, if applicable. No additional management support is needed unless otherwise documented below in the visit note. 

## 2016-07-28 DIAGNOSIS — Z79899 Other long term (current) drug therapy: Secondary | ICD-10-CM | POA: Diagnosis not present

## 2016-07-28 DIAGNOSIS — Z79891 Long term (current) use of opiate analgesic: Secondary | ICD-10-CM | POA: Diagnosis not present

## 2016-07-31 ENCOUNTER — Other Ambulatory Visit: Payer: Self-pay | Admitting: Family Medicine

## 2016-08-09 ENCOUNTER — Other Ambulatory Visit: Payer: Self-pay | Admitting: Gastroenterology

## 2016-08-23 ENCOUNTER — Ambulatory Visit: Payer: Medicare Other | Admitting: *Deleted

## 2016-08-23 NOTE — Progress Notes (Deleted)
Pre visit review using our clinic review tool, if applicable. No additional management support is needed unless otherwise documented below in the visit note. 

## 2016-08-23 NOTE — Progress Notes (Deleted)
Subjective:   [name] is a 60 y.o. female who presents for Medicare Annual (Subsequent) preventive examination.  Review of Systems:  No ROS.  Medicare Wellness Visit.    Sleep patterns: {SX; SLEEP PATTERNS:18802}.   Home Safety/Smoke Alarms:   Living environment; residence and Firearm Safety: {Rehab home environment / accessibility:30080}. Seat Belt Safety/Bike Helmet:   Counseling:   Eye Exam-  Dental-  Female:   Pap-        Mammo- Last 12/09/15: BI-Rads Category 1:neg.      Dexa scan- Last 10/06/12: Normal. F/u 5 years per report.  CCS- Last on 08/29/15: Anal stenosis, ileal pouch w/patchy areas of ulceration. Biopsy results show pouchitis.     Objective:     Vitals: There were no vitals taken for this visit.  There is no height or weight on file to calculate BMI.   Tobacco History  Smoking Status  . Former Smoker  . Packs/day: 1.00  . Years: 4.00  . Types: Cigarettes  . Quit date: 05/21/1979  Smokeless Tobacco  . Never Used     Counseling given: Not Answered   Past Medical History:  Diagnosis Date  . Abdominal pain    Hx  . Anal stenosis   . Anemia   . Anxiety   . Asthma    patient does not have inhaler  . Blood in stool    Hx  . Blood in urine    Hx  . De Quervain's tenosynovitis   . Depression   . Difficulty urinating    Hx  . Easy bruising   . Esophagitis   . Fainting    History - resolved - due to dehydration  . Fatigue    Hx  . Fibroid    Hx  . Gastric polyp   . GERD (gastroesophageal reflux disease)   . 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 Hemorrhoids, external   . Hemorrhoids, internal   . Herpes, genital    vaginal treated 07/05/12 and pt states is resolved  . History of cervical dysplasia   . History of small bowel obstruction   . Hyperlipidemia    currently no meds  . Hyperparathyroidism   . Hypertension   . Hypokalemia    Hx  . Obesity   . Ovarian cyst   . Poor dental hygiene   . Pulmonary  nodule, right    right upper lobe  . Rectal bleeding    Hx  . Rectal pain    Hx  . Renal insufficiency    CKD - stage 2  . RLS (restless legs syndrome)    no meds  . Ulcerative colitis   . Visual disturbance    wears glasses  . Weakness generalized    Hx - patient denies generalized weakness  . Wears dentures    upper only   Past Surgical History:  Procedure Laterality Date  . ANAL DILATION    . CERVICAL BIOPSY  W/ LOOP ELECTRODE EXCISION    . CHOLECYSTECTOMY    . COLONOSCOPY     [name]  . fatty tumor removed from back     X 2  . HEMORRHOID SURGERY    . ILEOSTOMY CLOSURE    . RESTORATIVE PROCTOCOLECTOMY     with insertion of ileoanal J Pouch with loop ileostomy  . TOTAL ABDOMINAL HYSTERECTOMY  1998   TAH/LSO  . TUBAL LIGATION    . UPPER GASTROINTESTINAL  ENDOSCOPY     [name]   Family History  Problem Relation Age of Onset  . Heart disease Father   . Ulcerative colitis Father   . Hypertension Father   . Hypertension Mother   . Heart disease Mother   . Ulcerative colitis Daughter   . Irritable bowel syndrome      grandchildren  . Diabetes Sister   . Cancer Sister     uterine  . Cancer Maternal Uncle     LUNG  . Colon cancer Neg Hx   . Esophageal cancer Neg Hx   . Stomach cancer Neg Hx   . Rectal cancer Neg Hx    History  Sexual Activity  . Sexual activity: Yes  . Birth control/ protection: Post-menopausal    Outpatient Encounter Prescriptions as of 08/23/2016  Medication Sig  . BIOTIN PO Take by mouth daily.  . calcitRIOL (ROCALTROL) 0.25 MCG capsule Take 0.25 mcg by mouth every other day. Mon, Wed, Fri  . DULoxetine (CYMBALTA) 60 MG capsule TAKE 2 TABLETS BY MOUTH ONCE DAILY (Patient taking differently: Take 60 mg by mouth daily. TAKE 2 TABLETS BY MOUTH ONCE DAILY)  . HYDROcodone-acetaminophen (NORCO/VICODIN) 5-325 MG tablet Take 2 tablets by mouth every 4 (four) hours as needed for moderate pain or severe pain.  Marland Kitchen mercaptopurine (PURINETHOL) 50 MG  tablet Take 1 tablet (50 mg total) by mouth daily.  . metoprolol succinate (TOPROL-XL) 50 MG 24 hr tablet Take 1 tablet (50 mg total) by mouth daily.  . Multiple Vitamins-Minerals (MULTIVITAL) CHEW Chew by mouth. Reported on 03/23/2016  . Probiotic Product (VSL#3) CAPS Take 1 tablet daily by mouth  . ranitidine (ZANTAC) 150 MG tablet TAKE ONE TABLET BY MOUTH TWICE DAILY  . rifaximin (XIFAXAN) 550 MG TABS tablet Take 1 tablet (550 mg total) by mouth 3 (three) times daily.  . simethicone (MYLICON) 80 MG chewable tablet Chew 80 mg by mouth every 6 (six) hours as needed for flatulence.  . temazepam (RESTORIL) 30 MG capsule Take 1 capsule (30 mg total) by mouth at bedtime.  Marland Kitchen ULORIC 40 MG tablet TAKE 1 TABLET (40 MG TOTAL) BY MOUTH DAILY.  . valACYclovir (VALTREX) 1000 MG tablet Take 1 tablet (1,000 mg total) by mouth daily. (Patient not taking: Reported on 07/27/2016)  . vedolizumab (ENTYVIO) 300 MG injection Inject into the vein.   No facility-administered encounter medications on file as of 08/23/2016.     Activities of Daily Living In your present state of health, do you have any difficulty performing the following activities: 06/28/2016 11/11/2015  Hearing? N N  Vision? N N  Difficulty concentrating or making decisions? N N  Walking or climbing stairs? N N  Dressing or bathing? N N  Doing errands, shopping? - N  Some recent data might be hidden    Patient Care Team: Ann Held, DO as PCP - General Donato Heinz, MD (Nephrology) Manus Gunning, MD as Consulting Physician (Gastroenterology) Terrance Mass, MD as Consulting Physician (Gynecology) Rutherford Guys, MD as Consulting Physician (Ophthalmology) Thayer Headings, MD as Consulting Physician (Cardiology)    Assessment:    Physical assessment deferred to PCP.  Exercise Activities and Dietary recommendations   Diet (meal preparation, eat out, water intake, caffeinated beverages, dairy products, fruits and  vegetables): {Desc; diets:16563} Breakfast: Lunch:  Dinner:      Goals      Patient Stated   . Lose 20 lbs by next year.   (pt-stated)  Increase physical activity-walking       Fall Risk Fall Risk  11/11/2015 08/18/2015  Falls in the past year? No Yes  Number falls in past yr: - 2 or more  Injury with Fall? - No  Risk Factor Category  - High Fall Risk  Risk for fall due to : - Impaired balance/gait  Follow up - Education provided;Falls prevention discussed   Depression Screen PHQ 2/9 Scores 07/27/2016 11/11/2015 08/18/2015  PHQ - 2 Score 0 0 0     Cognitive Function        Immunization History  Administered Date(s) Administered  . Influenza Split 08/03/2011  . Influenza Whole 08/15/2007, 07/05/2008, 07/29/2009, 08/19/2010, 08/19/2012  . Influenza,inj,Quad PF,36+ Mos 07/23/2013, 06/12/2014, 06/02/2015  . Influenza-Unspecified 06/02/2016  . PPD Test 06/07/2011, 06/13/2012, 09/14/2013  . Pneumococcal Polysaccharide-23 07/23/2013  . Tdap 09/13/2011   Screening Tests Health Maintenance  Topic Date Due  . Hepatitis C Screening  April 29, 1956  . HIV Screening  12/14/1968  . COLONOSCOPY  06/02/2016  . MAMMOGRAM  12/08/2016  . TETANUS/TDAP  09/12/2021  . INFLUENZA VACCINE  Completed      Plan:   *** During the course of the visit the patient was educated and counseled about the following appropriate screening and preventive services:   Vaccines to include Pneumoccal, Influenza, Hepatitis B, Td, Zostavax, HCV  Electrocardiogram  Cardiovascular Disease  Colorectal cancer screening  Bone density screening  Diabetes screening  Glaucoma screening  Mammography/PAP  Nutrition counseling   Patient Instructions (the written plan) was given to the patient.   Shela Nevin, [place]  08/23/2016

## 2016-09-01 ENCOUNTER — Encounter: Payer: Self-pay | Admitting: Family Medicine

## 2016-09-02 ENCOUNTER — Other Ambulatory Visit: Payer: Self-pay | Admitting: Family Medicine

## 2016-09-02 DIAGNOSIS — F329 Major depressive disorder, single episode, unspecified: Secondary | ICD-10-CM

## 2016-09-02 DIAGNOSIS — F32A Depression, unspecified: Secondary | ICD-10-CM

## 2016-09-09 ENCOUNTER — Other Ambulatory Visit: Payer: Self-pay

## 2016-09-10 ENCOUNTER — Telehealth: Payer: Self-pay

## 2016-09-10 ENCOUNTER — Telehealth: Payer: Self-pay | Admitting: Gastroenterology

## 2016-09-10 NOTE — Telephone Encounter (Signed)
Left message for patient that she can come pick up her paperwork for assistance on Entyvio co-pays. Envelope at front desk.

## 2016-09-10 NOTE — Telephone Encounter (Signed)
Faxed paperwork, also mailed original to patient.

## 2016-09-14 NOTE — Telephone Encounter (Signed)
Patient states that financial support did not receive fax.

## 2016-09-14 NOTE — Telephone Encounter (Signed)
Explained that fax went through on our end, then mailed it to her. When she receives this in the mail she can send it to them.

## 2016-09-15 DIAGNOSIS — M109 Gout, unspecified: Secondary | ICD-10-CM | POA: Diagnosis not present

## 2016-09-15 DIAGNOSIS — N183 Chronic kidney disease, stage 3 (moderate): Secondary | ICD-10-CM | POA: Diagnosis not present

## 2016-09-15 DIAGNOSIS — I129 Hypertensive chronic kidney disease with stage 1 through stage 4 chronic kidney disease, or unspecified chronic kidney disease: Secondary | ICD-10-CM | POA: Diagnosis not present

## 2016-09-15 DIAGNOSIS — D509 Iron deficiency anemia, unspecified: Secondary | ICD-10-CM | POA: Diagnosis not present

## 2016-09-15 DIAGNOSIS — N179 Acute kidney failure, unspecified: Secondary | ICD-10-CM | POA: Diagnosis not present

## 2016-09-17 ENCOUNTER — Other Ambulatory Visit: Payer: Self-pay

## 2016-09-17 DIAGNOSIS — K50118 Crohn's disease of large intestine with other complication: Secondary | ICD-10-CM

## 2016-09-20 ENCOUNTER — Ambulatory Visit (HOSPITAL_COMMUNITY): Payer: Medicare Other

## 2016-09-23 ENCOUNTER — Encounter (HOSPITAL_COMMUNITY): Payer: Self-pay

## 2016-09-23 ENCOUNTER — Encounter (HOSPITAL_COMMUNITY)
Admission: RE | Admit: 2016-09-23 | Discharge: 2016-09-23 | Disposition: A | Discharge status: Home / Self Care | Payer: Medicare Other | Source: Ambulatory Visit | Attending: Gastroenterology | Admitting: Gastroenterology

## 2016-09-23 DIAGNOSIS — K50118 Crohn's disease of large intestine with other complication: Secondary | ICD-10-CM | POA: Diagnosis not present

## 2016-09-23 MED ORDER — VEDOLIZUMAB 300 MG IV SOLR
300.0000 mg | Freq: Once | INTRAVENOUS | Status: AC
  Administered 2016-09-23: 300 mg via INTRAVENOUS
  Filled 2016-09-23: qty 5

## 2016-09-23 MED ORDER — SODIUM CHLORIDE 0.9 % IV SOLN
INTRAVENOUS | Status: DC
  Administered 2016-09-23: 09:00:00 via INTRAVENOUS

## 2016-09-27 ENCOUNTER — Other Ambulatory Visit: Payer: Self-pay | Admitting: Gastroenterology

## 2016-09-27 NOTE — Telephone Encounter (Signed)
Is this medication ok to refill? 

## 2016-09-28 NOTE — Telephone Encounter (Signed)
Yes we can refill but she is very overdue for CBC and CMET. Can you please order these for her and ask her to go to the lab. It is very important she has labs on this regimen. Thanks

## 2016-10-11 ENCOUNTER — Other Ambulatory Visit: Payer: Self-pay | Admitting: Gastroenterology

## 2016-10-12 NOTE — Telephone Encounter (Signed)
Ok to refill 

## 2016-10-12 NOTE — Telephone Encounter (Signed)
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 She is requesting refill for this. Best # [phone]

## 2016-10-13 ENCOUNTER — Telehealth: Payer: Self-pay

## 2016-10-13 NOTE — Telephone Encounter (Signed)
Patient states that since 10/08/16 she has increase in diarrhea, urgency/incontinence of bowel. No fever, some left sided abdominal pain. She had called in requesting flagyl refill. Please advise.

## 2016-10-13 NOTE — Telephone Encounter (Signed)
Left a message for patient that refill request has been approved for flagyl. Instructed her that if symptoms got worse or did not clear up then she needs to call the office. Dr. Havery Moros see Rx refill request notes, thanks.

## 2016-10-13 NOTE — Telephone Encounter (Signed)
Okay to refill. Please let Dr. Havery Moros know

## 2016-10-13 NOTE — Telephone Encounter (Signed)
Rx refilled.

## 2016-10-14 ENCOUNTER — Telehealth: Payer: Self-pay

## 2016-10-14 NOTE — Telephone Encounter (Signed)
Left message for patient to call back and schedule follow up with Dr. Havery Moros.

## 2016-10-14 NOTE — Telephone Encounter (Signed)
Can you clarify why she needs Flagyl? I hope she is doing better on Entyvio and 6MP. She is overdue for a clinic visit. Can you help coordinate a follow up? Thanks much

## 2016-10-14 NOTE — Telephone Encounter (Signed)
I am not sure why Rx refill documentation is not available to be seen, but this is the documentation. Patient had increased diarrhea, urgency/incontinence of bowel. No fever, some left sided abdominal pain. 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  Rx refilled.      Documentation     Irene Shipper, MD  You; Manus Gunning, MD Yesterday (10:46 AM)      Faythe Ghee to refill. Please let Dr. Havery Moros know      Documentation     You routed conversation to Irene Shipper, MD Yesterday (9:38 AM)    Dennis Bast Yesterday (9:35 AM)      Patient states that since 10/08/16 she has increase in diarrhea, urgency/incontinence of bowel. No fever, some left sided abdominal pain. She had called in requesting flagyl refill. Please advise.      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 She is requesting refill for this. Best # [phone]      Documentation     Alphonzo Dublin, LPN routed conversation to Manus Gunning, MD 2 days ago    Alphonzo Dublin, LPN 2 days ago      [facility] to refill?

## 2016-10-15 NOTE — Telephone Encounter (Signed)
Okay thanks for clarifying. It would be good to see her for a follow visit in the next 1-2 months as I have not seen her in a while. Thanks

## 2016-10-19 ENCOUNTER — Encounter (HOSPITAL_COMMUNITY)
Admission: RE | Admit: 2016-10-19 | Discharge: 2016-10-19 | Disposition: A | Discharge status: Home / Self Care | Payer: Medicare Other | Source: Ambulatory Visit | Attending: Gastroenterology | Admitting: Gastroenterology

## 2016-10-19 ENCOUNTER — Encounter (HOSPITAL_COMMUNITY): Payer: Self-pay

## 2016-10-19 DIAGNOSIS — K50118 Crohn's disease of large intestine with other complication: Secondary | ICD-10-CM | POA: Insufficient documentation

## 2016-10-19 MED ORDER — SODIUM CHLORIDE 0.9 % IV SOLN
Freq: Once | INTRAVENOUS | Status: AC
  Administered 2016-10-19: 12:00:00 via INTRAVENOUS

## 2016-10-19 MED ORDER — SODIUM CHLORIDE 0.9 % IV SOLN
510.0000 mg | INTRAVENOUS | Status: DC
  Administered 2016-10-19: 510 mg via INTRAVENOUS
  Filled 2016-10-19: qty 17

## 2016-10-19 NOTE — Discharge Instructions (Signed)
°  Call MD for any problems or questions. 911 for emergencies.      Feraheme Ferumoxytol injection What is this medicine? FERUMOXYTOL is an iron complex. Iron is used to make healthy red blood cells, which carry oxygen and nutrients throughout the body. This medicine is used to treat iron deficiency anemia in people with chronic kidney disease. COMMON BRAND NAME(S): Feraheme What should I tell my health care provider before I take this medicine? They need to know if you have any of these conditions: -anemia not caused by low iron levels -high levels of iron in the blood -magnetic resonance imaging (MRI) test scheduled -an unusual or allergic reaction to iron, other medicines, foods, dyes, or preservatives -pregnant or trying to get pregnant -breast-feeding How should I use this medicine? This medicine is for injection into a vein. It is given by a health care professional in a hospital or clinic setting. Talk to your pediatrician regarding the use of this medicine in children. Special care may be needed. What if I miss a dose? It is important not to miss your dose. Call your doctor or health care professional if you are unable to keep an appointment. What may interact with this medicine? This medicine may interact with the following medications: -other iron products What should I watch for while using this medicine? Visit your doctor or healthcare professional regularly. Tell your doctor or healthcare professional if your symptoms do not start to get better or if they get worse. You may need blood work done while you are taking this medicine. You may need to follow a special diet. Talk to your doctor. Foods that contain iron include: whole grains/cereals, dried fruits, beans, or peas, leafy green vegetables, and organ meats (liver, kidney). What side effects may I notice from receiving this medicine? Side effects that you should report to your doctor or health care professional as soon  as possible: -allergic reactions like skin rash, itching or hives, swelling of the face, lips, or tongue -breathing problems -changes in blood pressure -feeling faint or lightheaded, falls -fever or chills -flushing, sweating, or hot feelings -swelling of the ankles or feet Side effects that usually do not require medical attention (report to your doctor or health care professional if they continue or are bothersome): -diarrhea -headache -nausea, vomiting -stomach pain Where should I keep my medicine? This drug is given in a hospital or clinic and will not be stored at home.  2017 Elsevier/Gold Standard (2015-09-25 12:41:49)

## 2016-10-19 NOTE — Progress Notes (Signed)
Patient had first of two doses of Feraheme today. Tolerated well. Will be back in a week for second dose.

## 2016-10-26 ENCOUNTER — Encounter (HOSPITAL_COMMUNITY): Admission: RE | Admit: 2016-10-26 | Payer: Medicare Other | Source: Ambulatory Visit

## 2016-10-29 ENCOUNTER — Other Ambulatory Visit: Payer: Self-pay | Admitting: Family Medicine

## 2016-10-29 ENCOUNTER — Telehealth: Payer: Self-pay | Admitting: Family Medicine

## 2016-10-29 DIAGNOSIS — K047 Periapical abscess without sinus: Secondary | ICD-10-CM

## 2016-10-29 MED ORDER — AMOXICILLIN-POT CLAVULANATE 875-125 MG PO TABS: 1.0000 | ORAL_TABLET | Freq: Two times a day (BID) | ORAL | 0 refills | Status: DC

## 2016-10-29 NOTE — Telephone Encounter (Signed)
Relation to [id number] Call back number:[phone] Pharmacy: [id number] [place], [place] [phone](Phone) [phone](Fax)     Reason for call:  Patent has a scheduled dentist appointment for March 8th due to an infection, dentist advised patient to call PCP for antibotic to hold patient over until dentist appointment, please advise

## 2016-10-29 NOTE — Telephone Encounter (Signed)
augmentin 875 mg bid x 10 days

## 2016-11-01 NOTE — Telephone Encounter (Signed)
Spoke with pt and she has picked up the Rx and already started it and states she is starting to feel better.

## 2016-11-02 ENCOUNTER — Telehealth: Payer: Self-pay | Admitting: Gastroenterology

## 2016-11-02 NOTE — Telephone Encounter (Signed)
Patient to drop off paperwork for patient assistance on Entyvio.

## 2016-11-04 ENCOUNTER — Encounter (HOSPITAL_COMMUNITY)
Admission: RE | Admit: 2016-11-04 | Discharge: 2016-11-04 | Disposition: A | Discharge status: Home / Self Care | Payer: Medicare Other | Source: Ambulatory Visit | Attending: Gastroenterology | Admitting: Gastroenterology

## 2016-11-04 ENCOUNTER — Encounter (HOSPITAL_COMMUNITY): Payer: Self-pay

## 2016-11-04 DIAGNOSIS — K50118 Crohn's disease of large intestine with other complication: Secondary | ICD-10-CM | POA: Diagnosis not present

## 2016-11-04 DIAGNOSIS — K047 Periapical abscess without sinus: Secondary | ICD-10-CM

## 2016-11-04 HISTORY — DX: Periapical abscess without sinus: K04.7

## 2016-11-04 MED ORDER — FERUMOXYTOL INJECTION 510 MG/17 ML
510.0000 mg | INTRAVENOUS | Status: AC
  Administered 2016-11-04: 510 mg via INTRAVENOUS
  Filled 2016-11-04: qty 17

## 2016-11-04 MED ORDER — SODIUM CHLORIDE 0.9 % IV SOLN
Freq: Once | INTRAVENOUS | Status: AC
  Administered 2016-11-04: 10:00:00 via INTRAVENOUS

## 2016-11-04 NOTE — Discharge Instructions (Signed)

## 2016-11-06 ENCOUNTER — Other Ambulatory Visit: Payer: Self-pay | Admitting: Gastroenterology

## 2016-11-10 ENCOUNTER — Other Ambulatory Visit: Payer: Self-pay

## 2016-11-10 ENCOUNTER — Telehealth: Payer: Self-pay | Admitting: Gastroenterology

## 2016-11-10 DIAGNOSIS — K50118 Crohn's disease of large intestine with other complication: Secondary | ICD-10-CM

## 2016-11-10 NOTE — Telephone Encounter (Signed)
I think it should be okay to proceed with Entyvio given it will be about a week after her scheduled tooth extraction. I think it is okay to proceed, unless she has complication from the extraction or an active infection, would consider holding the next dose but if she's doing well I would continue with it. Thanks

## 2016-11-10 NOTE — Telephone Encounter (Signed)
Left message for patient with Dr. Doyne Keel recommendations. Instructed her to call back if she has questions or concerns.

## 2016-11-10 NOTE — Telephone Encounter (Signed)
Patient had infected tooth, PCP put her on Augmentin 875 mg, has finished this and will be having the tooth extracted tomorrow. She reports tooth does not appear to be infected any more, just sensitive to extreme temperatures/air. Patient wondered if there was anything else she needed before or after this extraction? Her next [name] is scheduled for 3/15 and follow up with you on 3/20. Please advise.

## 2016-11-15 ENCOUNTER — Encounter (HOSPITAL_COMMUNITY): Payer: Medicare Other

## 2016-11-17 ENCOUNTER — Encounter (HOSPITAL_COMMUNITY): Admission: RE | Admit: 2016-11-17 | Payer: Medicare Other | Source: Ambulatory Visit

## 2016-11-18 ENCOUNTER — Encounter (HOSPITAL_COMMUNITY): Payer: Self-pay

## 2016-11-18 ENCOUNTER — Other Ambulatory Visit: Payer: Self-pay

## 2016-11-18 ENCOUNTER — Encounter (HOSPITAL_COMMUNITY)
Admission: RE | Admit: 2016-11-18 | Discharge: 2016-11-18 | Disposition: A | Discharge status: Home / Self Care | Payer: Medicare Other | Source: Ambulatory Visit | Attending: Gastroenterology | Admitting: Gastroenterology

## 2016-11-18 ENCOUNTER — Telehealth: Payer: Self-pay

## 2016-11-18 DIAGNOSIS — K50118 Crohn's disease of large intestine with other complication: Secondary | ICD-10-CM

## 2016-11-18 HISTORY — DX: Periapical abscess without sinus: K04.7

## 2016-11-18 MED ORDER — VEDOLIZUMAB 300 MG IV SOLR
300.0000 mg | Freq: Once | INTRAVENOUS | Status: AC
  Administered 2016-11-18: 300 mg via INTRAVENOUS
  Filled 2016-11-18: qty 5

## 2016-11-18 MED ORDER — SODIUM CHLORIDE 0.9 % IV SOLN
Freq: Once | INTRAVENOUS | Status: AC
  Administered 2016-11-18: 11:00:00 via INTRAVENOUS

## 2016-11-18 NOTE — Telephone Encounter (Signed)
Spoke to [employer] at [employer], let her know it is okay for patient to get her infusion today.

## 2016-11-18 NOTE — Progress Notes (Signed)
Pt states she is scheduled with oral surgeon for Tues Mar 20 (in 5 days) for tooth extraction (right lower) that had been infected, prior to round of antibiotics.  Contacted Dr Ozella Rocks office and await instructions.

## 2016-11-18 NOTE — Telephone Encounter (Signed)
Patient is at [facility] short stay right now for her Entyvio infusion, she did not have the tooth extracted last week as Dr. Havery Moros anticipated. She will be having this done next week by the oral surgeon. She had been on an oral antibiotic previously for a tooth infection, patient states she currently is not taking it, and has no infection. I need to know if it is okay for patient to have the infusion today, then next week will have her tooth pulled. Please advise.

## 2016-11-18 NOTE — Telephone Encounter (Signed)
Ok to get the entyvio infusion. Thanks

## 2016-11-22 DIAGNOSIS — H5203 Hypermetropia, bilateral: Secondary | ICD-10-CM | POA: Diagnosis not present

## 2016-11-22 DIAGNOSIS — H524 Presbyopia: Secondary | ICD-10-CM | POA: Diagnosis not present

## 2016-11-23 ENCOUNTER — Other Ambulatory Visit: Payer: Medicare Other

## 2016-11-23 ENCOUNTER — Encounter: Payer: Self-pay | Admitting: Gastroenterology

## 2016-11-23 ENCOUNTER — Other Ambulatory Visit (INDEPENDENT_AMBULATORY_CARE_PROVIDER_SITE_OTHER): Payer: Medicare Other

## 2016-11-23 ENCOUNTER — Ambulatory Visit (INDEPENDENT_AMBULATORY_CARE_PROVIDER_SITE_OTHER): Payer: Medicare Other | Admitting: Gastroenterology

## 2016-11-23 ENCOUNTER — Telehealth: Payer: Self-pay | Admitting: Family Medicine

## 2016-11-23 VITALS — BP 120/88 | HR 72 | Ht 63.0 in | Wt 182.0 lb

## 2016-11-23 DIAGNOSIS — K50919 Crohn's disease, unspecified, with unspecified complications: Secondary | ICD-10-CM | POA: Diagnosis not present

## 2016-11-23 DIAGNOSIS — M10071 Idiopathic gout, right ankle and foot: Secondary | ICD-10-CM

## 2016-11-23 DIAGNOSIS — K91850 Pouchitis: Secondary | ICD-10-CM

## 2016-11-23 DIAGNOSIS — K50118 Crohn's disease of large intestine with other complication: Secondary | ICD-10-CM

## 2016-11-23 DIAGNOSIS — K51819 Other ulcerative colitis with unspecified complications: Secondary | ICD-10-CM

## 2016-11-23 LAB — CBC WITH DIFFERENTIAL/PLATELET
Basophils Absolute: 0.1 10*3/uL (ref 0.0–0.1)
Basophils Relative: 0.5 % (ref 0.0–3.0)
Eosinophils Absolute: 0.3 10*3/uL (ref 0.0–0.7)
Eosinophils Relative: 3 % (ref 0.0–5.0)
HCT: 30.2 % — ABNORMAL LOW (ref 36.0–46.0)
Hemoglobin: 10.1 g/dL — ABNORMAL LOW (ref 12.0–15.0)
Lymphocytes Relative: 31.9 % (ref 12.0–46.0)
Lymphs Abs: 3 10*3/uL (ref 0.7–4.0)
MCHC: 33.5 g/dL (ref 30.0–36.0)
MCV: 102.5 fl — ABNORMAL HIGH (ref 78.0–100.0)
Monocytes Absolute: 0.7 10*3/uL (ref 0.1–1.0)
Monocytes Relative: 7.7 % (ref 3.0–12.0)
Neutro Abs: 5.3 10*3/uL (ref 1.4–7.7)
Neutrophils Relative %: 56.9 % (ref 43.0–77.0)
Platelets: 321 10*3/uL (ref 150.0–400.0)
RBC: 2.94 Mil/uL — ABNORMAL LOW (ref 3.87–5.11)
RDW: 14.4 % (ref 11.5–15.5)
WBC: 9.3 10*3/uL (ref 4.0–10.5)

## 2016-11-23 LAB — COMPREHENSIVE METABOLIC PANEL
ALT: 14 U/L (ref 0–35)
AST: 16 U/L (ref 0–37)
Albumin: 4 g/dL (ref 3.5–5.2)
Alkaline Phosphatase: 59 U/L (ref 39–117)
BUN: 40 mg/dL — ABNORMAL HIGH (ref 6–23)
CO2: 25 mEq/L (ref 19–32)
Calcium: 9.6 mg/dL (ref 8.4–10.5)
Chloride: 105 mEq/L (ref 96–112)
Creatinine, Ser: 2.09 mg/dL — ABNORMAL HIGH (ref 0.40–1.20)
GFR: 25.43 mL/min — ABNORMAL LOW (ref >60.00)
Glucose, Bld: 120 mg/dL — ABNORMAL HIGH (ref 70–99)
Potassium: 3.5 mEq/L (ref 3.5–5.1)
Sodium: 137 mEq/L (ref 135–145)
Total Bilirubin: 0.2 mg/dL (ref 0.2–1.2)
Total Protein: 7.4 g/dL (ref 6.0–8.3)

## 2016-11-23 MED ORDER — HYDROCODONE-ACETAMINOPHEN 5-325 MG PO TABS: 2.0000 | ORAL_TABLET | ORAL | 0 refills | Status: DC | PRN

## 2016-11-23 NOTE — Telephone Encounter (Signed)
Printed prescription/PCP signed Called the patient informed to pickup hardcopy at the front desk. (had to leave a detailed message)

## 2016-11-23 NOTE — Patient Instructions (Addendum)
If you are age 63 or older, your body mass index should be between 23-30. Your Body mass index is 32.24 kg/m. If this is out of the aforementioned range listed, please consider follow up with your Primary Care Provider.  If you are age 94 or younger, your body mass index should be between 19-25. Your Body mass index is 32.24 kg/m. If this is out of the aformentioned range listed, please consider follow up with your Primary Care Provider.   Your physician has requested that you go to the basement for the following lab work before leaving today:  CBC, CMET, Vit D  We will contact you when it is time to schedule your endoscopic procedure.  Thank you.

## 2016-11-23 NOTE — Telephone Encounter (Signed)
Requesting:   Hydrocodone Contract    07/27/2016 UDS    Low risk----next is due on 01/24/2017 Last OV   07/27/2016 Last Refill   #90 with 0 refills on 07/27/2016  Please Advise

## 2016-11-23 NOTE — Telephone Encounter (Signed)
Self.    Refill request for HYDROcodone   [phone]-

## 2016-11-23 NOTE — Progress Notes (Signed)
HPI :  IBD history: 61 year old white female with IBD (? ulcerative colitis vs Crohn's) since 1990. 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 She reports she was never able to be put in remission prior to colectomy. She reports only being exposed to steroids prior to her surgery. Post-operatively, she was on Remicade for 3-4 years but she is not sure if it helped too much. She has been on 6MP remotely but did not work for her. She was on Humira but also did not put her into remission and she developed high antibody titers and it was stopped. Now on Entyvio and low dose 6MP. She reports multiple courses of antibiotics with cipro and flagyl with some benefit.  She has been on budesonider with ? Relief. She denies NSAID use. She has a history of CKD. Father and daughter have UC. No tobacco use.  SINCE LAST VISIT:  Flex sig done 08/29/2015 - showed deep ulcerations at an angulated turn entering the pouch, and at the anastomosis with the proximal small bowel, other than those areas the pouch looked okay. Biopsies were nonspecific. I referred her to [facility] to ensure no ischemic changes. Unclear if she had chronic pouchitis or Crohns of the pouch otherwise. We had placed her on VSL#3 and has been on trials of  Antibiotics, Canasa. Her Humira was stopped in October after she developed high levels of anti-drug antibodies. She had a repeat flex sig at [facility], biopsies of the pre pouch ileum which were normal. 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 Path is c/w "IBD / pouchitis". No CMV. IgG4 stains negative.  Since her last visit I spoke with Dr. Emelda Fear of [place] whom she had seen for a second opinion - we discussed that she either had refractory / chronic pouchitis or Crohn's of the pouch, difficult to say which. She had been on Remicade and Humira previously and failed. She then proceeded with starting Entyvio  along with low dose 6MP to prevent antibody formation (given history of antibody formation to Humira). She has been on Entyvio since early October 2017. Last infusion was March 15th.   She reports doing much better in general since starting Entyvio. She reports fecal leakage is significantly improved, very mild at this time. She usually passes stool every time she urinates to ensure she does not have an accident. She reports she has always done this. She reports stools are soft, not loose like it was previously. She this has improved. She has very rare bleeding. She thinks superficial due to wiping excessively. She denies much of any abdominal pains. She is tolerating 6MP once daily at 50mg .     Past Medical History:  Diagnosis Date  . Abdominal pain    Hx  . Anal stenosis   . Anemia   . Anxiety   . Asthma    patient does not have inhaler  . Blood in stool    Hx  . Blood in urine    Hx  . De Quervain's tenosynovitis   . Depression   . Difficulty urinating    Hx  . Easy bruising   . Esophagitis   . Fainting    History - resolved - due to dehydration  . Fatigue    Hx  . Fibroid    Hx  . Gastric polyp   . GERD (gastroesophageal reflux disease)   . 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 Hemorrhoids, external   . Hemorrhoids, internal   . Herpes, genital    vaginal treated 07/05/12 and pt states is resolved  . History of cervical dysplasia   . History of small bowel obstruction   . Hyperlipidemia    currently no meds  . Hyperparathyroidism   . Hypertension   . Hypokalemia    Hx  . 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 Obesity   . Ovarian cyst   . Poor dental hygiene   . Pulmonary nodule, right    right upper lobe  . Rectal bleeding    Hx  . Rectal pain    Hx  . Renal insufficiency    CKD - stage 2  . RLS (restless legs syndrome)    no meds  . Tooth  infection 11/2016   right low  . Ulcerative colitis   . Visual disturbance    wears glasses  . Weakness generalized    Hx - patient denies generalized weakness  . Wears dentures    upper only     Past Surgical History:  Procedure Laterality Date  . ANAL DILATION    . CERVICAL BIOPSY  W/ LOOP ELECTRODE EXCISION    . CHOLECYSTECTOMY    . COLONOSCOPY     [name]  . fatty tumor removed from back     X 2  . HEMORRHOID SURGERY    . ILEOSTOMY CLOSURE    . RESTORATIVE PROCTOCOLECTOMY     with insertion of ileoanal J Pouch with loop ileostomy  . TOTAL ABDOMINAL HYSTERECTOMY  1998   TAH/LSO  . TUBAL LIGATION    . UPPER GASTROINTESTINAL ENDOSCOPY     [name]   Family History  Problem Relation Age of Onset  . Heart disease Father   . Ulcerative colitis Father   . Hypertension Father   . Hypertension Mother   . Heart disease Mother   . Ulcerative colitis Daughter   . Irritable bowel syndrome      grandchildren  . Diabetes Sister   . Cancer Sister     uterine  . Cancer Maternal Uncle     LUNG  . Colon cancer Neg Hx   . Esophageal cancer Neg Hx   . Stomach cancer Neg Hx   . Rectal cancer Neg Hx    Social History  Substance Use Topics  . Smoking status: Former Smoker    Packs/day: 1.00    Years: 4.00    Types: Cigarettes    Quit date: 05/21/1979  . Smokeless tobacco: Never Used  . Alcohol use No   Current Outpatient Prescriptions  Medication Sig Dispense Refill  . BIOTIN PO Take by mouth daily.    . calcitRIOL (ROCALTROL) 0.25 MCG capsule Take 0.25 mcg by mouth every other day. Mon, Wed, Fri    . DULoxetine (CYMBALTA) 60 MG capsule TAKE 2 CAPSULES BY MOUTH DAILY**INS TO PAY 6/17** 180 capsule 0  . Melatonin 2.5 MG CAPS Take by mouth 1 day or 1 dose.    . mercaptopurine (PURINETHOL) 50 MG tablet TAKE 1 TABLET (50 MG TOTAL) BY MOUTH DAILY. 90 tablet 0  . metoprolol succinate (TOPROL-XL) 50 MG 24 hr tablet Take 1 tablet (50 mg total) by mouth daily. 90 tablet 3  .  metroNIDAZOLE (FLAGYL) 500 MG tablet TAKE 1 TABLET (500 MG TOTAL) BY MOUTH  2 (TWO) TIMES DAILY. 28 tablet 0  . Multiple Vitamins-Minerals (MULTIVITAL) CHEW Chew by mouth. Reported on 03/23/2016    . Probiotic Product (VSL#3) CAPS Take 1 tablet daily by mouth 60 capsule 0  . ranitidine (ZANTAC) 150 MG tablet TAKE ONE TABLET BY MOUTH TWICE DAILY 180 tablet 0  . simethicone (MYLICON) 80 MG chewable tablet Chew 80 mg by mouth every 6 (six) hours as needed for flatulence.    . temazepam (RESTORIL) 30 MG capsule Take 1 capsule (30 mg total) by mouth at bedtime. 30 capsule 1  . ULORIC 40 MG tablet TAKE 1 TABLET (40 MG TOTAL) BY MOUTH DAILY. 30 tablet 10  . vedolizumab (ENTYVIO) 300 MG injection Inject into the vein.    Marland Kitchen HYDROcodone-acetaminophen (NORCO/VICODIN) 5-325 MG tablet Take 2 tablets by mouth every 4 (four) hours as needed for moderate pain or severe pain. 90 tablet 0   No current facility-administered medications for this visit.    Allergies  Allergen Reactions  . Morphine Other (See Comments)    Gives me crazy dreams  . Sulfonamide Derivatives Itching, Swelling and Rash     Review of Systems: All systems reviewed and negative except where noted in HPI.   Lab Results  Component Value Date   WBC 5.2 06/14/2016   HGB 10.8 (L) 06/14/2016   HCT 32.5 (L) 06/14/2016   MCV 96.4 06/14/2016   PLT 248 06/14/2016    Lab Results  Component Value Date   CREATININE 2.11 (H) 06/14/2016   BUN 32 (H) 06/14/2016   NA 134 (L) 06/14/2016   K 3.5 06/14/2016   CL 103 06/14/2016   CO2 23 06/14/2016    Lab Results  Component Value Date   ALT 19 06/14/2016   AST 22 06/14/2016   ALKPHOS 58 06/14/2016   BILITOT 0.4 06/14/2016     Physical Exam: BP 120/88   Pulse 72   Ht 5\' 3"  (1.6 m)   Wt 182 lb (82.6 kg)   BMI 32.24 kg/m  Constitutional: Pleasant,well-developed, female in no acute distress. HEENT: Normocephalic and atraumatic. Conjunctivae are normal. No scleral icterus. Neck  supple.  Cardiovascular: Normal rate, regular rhythm.  Pulmonary/chest: Effort normal and breath sounds normal. No wheezing, rales or rhonchi. Abdominal: Soft, nondistended, mild LLQ TTP, subcutaneous lipoma / cyst palpated.  Extremities: no edema Lymphadenopathy: No cervical adenopathy noted. Neurological: Alert and oriented to person place and time. Skin: Skin is warm and dry. No rashes noted. Psychiatric: Normal mood and affect. Behavior is normal.   ASSESSMENT AND PLAN: 61 year old female here for follow-up visit for inflammatory bowel disease:  She has an extensive history as outlined above. 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 She failed Humira and Remicade. Now on Entyvio with low-dose 6-MP to prevent immunogenicity. Overall she is tolerating the regimen well and has had significant improvement in her symptoms in recent months, this is the best she has felt in years. We will continue her regimen for now. She is due for basic labs at this time to include CBC, CMET, and vitamin D. I like to perform a repeat endoscopy to look at the pouch and assess for interval healing on this regimen. She is agreeable to do this however would prefer to do this over the late Spring / early Summer time frame. She will call in the interim with questions / concerns.   [place] Cellar, MD [facility] Pager [phone]

## 2016-11-23 NOTE — Telephone Encounter (Signed)
Indication for chronic opioid: chronic pain Medication and dose: hydrocodone  # pills per month: #90 Last UDS date: 01/25/2016 Pain contract signed (Y/N): 07/27/2016 Date narcotic database last reviewed (include red flags): 11/23/2016  Ok to refill x1

## 2016-11-24 LAB — VITAMIN D 25 HYDROXY (VIT D DEFICIENCY, FRACTURES): VITD: 21.93 ng/mL — ABNORMAL LOW (ref 30.00–100.00)

## 2016-11-26 ENCOUNTER — Other Ambulatory Visit: Payer: Self-pay

## 2016-11-26 MED ORDER — VITAMIN D (ERGOCALCIFEROL) 1.25 MG (50000 UNIT) PO CAPS: 50000.0000 [IU] | ORAL_CAPSULE | ORAL | 0 refills | Status: DC

## 2016-11-27 ENCOUNTER — Other Ambulatory Visit: Payer: Self-pay | Admitting: Gastroenterology

## 2016-11-30 ENCOUNTER — Other Ambulatory Visit: Payer: Self-pay | Admitting: Family Medicine

## 2016-11-30 DIAGNOSIS — F329 Major depressive disorder, single episode, unspecified: Secondary | ICD-10-CM

## 2016-11-30 DIAGNOSIS — F32A Depression, unspecified: Secondary | ICD-10-CM

## 2016-12-01 ENCOUNTER — Telehealth: Payer: Self-pay

## 2016-12-01 ENCOUNTER — Other Ambulatory Visit: Payer: Self-pay

## 2016-12-01 DIAGNOSIS — K91850 Pouchitis: Secondary | ICD-10-CM

## 2016-12-01 NOTE — Telephone Encounter (Signed)
Pt is scheduled for 02/07/17 at 10am. Pt made aware. Instructions mailed. Pt informed to come by office before 01/26/17 to sign consent form. Ambulatory referral made

## 2016-12-01 NOTE — Telephone Encounter (Signed)
-----   Message from Doristine Counter, RN sent at 11/26/2016  4:34 PM EDT ----- Caryl Pina, I see that this patient was here for an office visit the other day. I called her about lab results. She mentioned that she would like her procedure done on a Monday in June. I did not know if she communicated this to you or if you have put in a reminder to yourself. Let me know. Thanks, Almyra Free

## 2016-12-10 ENCOUNTER — Encounter: Payer: Self-pay | Admitting: Gynecology

## 2016-12-10 DIAGNOSIS — Z1231 Encounter for screening mammogram for malignant neoplasm of breast: Secondary | ICD-10-CM | POA: Diagnosis not present

## 2016-12-24 DIAGNOSIS — N183 Chronic kidney disease, stage 3 (moderate): Secondary | ICD-10-CM | POA: Diagnosis not present

## 2016-12-24 DIAGNOSIS — M109 Gout, unspecified: Secondary | ICD-10-CM | POA: Diagnosis not present

## 2016-12-24 DIAGNOSIS — I129 Hypertensive chronic kidney disease with stage 1 through stage 4 chronic kidney disease, or unspecified chronic kidney disease: Secondary | ICD-10-CM | POA: Diagnosis not present

## 2016-12-24 DIAGNOSIS — D509 Iron deficiency anemia, unspecified: Secondary | ICD-10-CM | POA: Diagnosis not present

## 2016-12-24 DIAGNOSIS — N179 Acute kidney failure, unspecified: Secondary | ICD-10-CM | POA: Diagnosis not present

## 2016-12-31 ENCOUNTER — Other Ambulatory Visit: Payer: Self-pay | Admitting: Gastroenterology

## 2016-12-31 ENCOUNTER — Ambulatory Visit: Payer: Medicare Other | Admitting: Family Medicine

## 2016-12-31 NOTE — Telephone Encounter (Signed)
Yes we can refill. Due for repeat labs in June. Thanks

## 2016-12-31 NOTE — Telephone Encounter (Signed)
Ok to refill Mercaptopurine? Pt has Egd scheduled for June. Most recent labs were in March this year.

## 2017-01-06 ENCOUNTER — Other Ambulatory Visit: Payer: Self-pay

## 2017-01-06 DIAGNOSIS — K50118 Crohn's disease of large intestine with other complication: Secondary | ICD-10-CM

## 2017-01-13 ENCOUNTER — Encounter (HOSPITAL_COMMUNITY)
Admission: RE | Admit: 2017-01-13 | Discharge: 2017-01-13 | Disposition: A | Discharge status: Home / Self Care | Payer: Medicare Other | Source: Ambulatory Visit | Attending: Gastroenterology | Admitting: Gastroenterology

## 2017-01-13 ENCOUNTER — Encounter (HOSPITAL_COMMUNITY): Payer: Self-pay

## 2017-01-13 DIAGNOSIS — K50118 Crohn's disease of large intestine with other complication: Secondary | ICD-10-CM | POA: Diagnosis not present

## 2017-01-13 MED ORDER — SODIUM CHLORIDE 0.9 % IV SOLN
300.0000 mg | Freq: Once | INTRAVENOUS | Status: AC
  Administered 2017-01-13: 300 mg via INTRAVENOUS
  Filled 2017-01-13: qty 5

## 2017-01-13 MED ORDER — SODIUM CHLORIDE 0.9 % IV SOLN
INTRAVENOUS | Status: DC
  Administered 2017-01-13: 09:00:00 via INTRAVENOUS

## 2017-01-14 ENCOUNTER — Emergency Department (HOSPITAL_COMMUNITY): Payer: Medicare Other

## 2017-01-14 ENCOUNTER — Ambulatory Visit (INDEPENDENT_AMBULATORY_CARE_PROVIDER_SITE_OTHER): Payer: Medicare Other | Admitting: Physician Assistant

## 2017-01-14 ENCOUNTER — Encounter (HOSPITAL_COMMUNITY): Payer: Self-pay | Admitting: Emergency Medicine

## 2017-01-14 ENCOUNTER — Encounter: Payer: Self-pay | Admitting: Physician Assistant

## 2017-01-14 ENCOUNTER — Observation Stay (HOSPITAL_COMMUNITY)
Admission: EM | Admit: 2017-01-14 | Discharge: 2017-01-15 | Disposition: A | Discharge status: Home / Self Care | Payer: Medicare Other | Attending: Internal Medicine | Admitting: Internal Medicine

## 2017-01-14 VITALS — BP 120/66 | HR 95 | Ht 63.0 in | Wt 181.0 lb

## 2017-01-14 DIAGNOSIS — N182 Chronic kidney disease, stage 2 (mild): Secondary | ICD-10-CM

## 2017-01-14 DIAGNOSIS — K922 Gastrointestinal hemorrhage, unspecified: Secondary | ICD-10-CM | POA: Diagnosis not present

## 2017-01-14 DIAGNOSIS — K921 Melena: Secondary | ICD-10-CM | POA: Diagnosis not present

## 2017-01-14 DIAGNOSIS — I10 Essential (primary) hypertension: Secondary | ICD-10-CM | POA: Diagnosis not present

## 2017-01-14 DIAGNOSIS — R0609 Other forms of dyspnea: Secondary | ICD-10-CM | POA: Insufficient documentation

## 2017-01-14 DIAGNOSIS — K50119 Crohn's disease of large intestine with unspecified complications: Secondary | ICD-10-CM

## 2017-01-14 DIAGNOSIS — I129 Hypertensive chronic kidney disease with stage 1 through stage 4 chronic kidney disease, or unspecified chronic kidney disease: Secondary | ICD-10-CM | POA: Diagnosis not present

## 2017-01-14 DIAGNOSIS — Z87891 Personal history of nicotine dependence: Secondary | ICD-10-CM | POA: Insufficient documentation

## 2017-01-14 DIAGNOSIS — R0602 Shortness of breath: Secondary | ICD-10-CM

## 2017-01-14 DIAGNOSIS — R5383 Other fatigue: Secondary | ICD-10-CM | POA: Diagnosis not present

## 2017-01-14 DIAGNOSIS — K91850 Pouchitis: Secondary | ICD-10-CM | POA: Diagnosis present

## 2017-01-14 DIAGNOSIS — D649 Anemia, unspecified: Principal | ICD-10-CM | POA: Diagnosis present

## 2017-01-14 DIAGNOSIS — N184 Chronic kidney disease, stage 4 (severe): Secondary | ICD-10-CM | POA: Diagnosis present

## 2017-01-14 DIAGNOSIS — J45909 Unspecified asthma, uncomplicated: Secondary | ICD-10-CM | POA: Diagnosis not present

## 2017-01-14 DIAGNOSIS — R0789 Other chest pain: Secondary | ICD-10-CM

## 2017-01-14 DIAGNOSIS — Z79899 Other long term (current) drug therapy: Secondary | ICD-10-CM | POA: Insufficient documentation

## 2017-01-14 DIAGNOSIS — R42 Dizziness and giddiness: Secondary | ICD-10-CM

## 2017-01-14 DIAGNOSIS — R0600 Dyspnea, unspecified: Secondary | ICD-10-CM

## 2017-01-14 DIAGNOSIS — E559 Vitamin D deficiency, unspecified: Secondary | ICD-10-CM | POA: Diagnosis not present

## 2017-01-14 LAB — COMPREHENSIVE METABOLIC PANEL
ALT: 19 U/L (ref 14–54)
AST: 23 U/L (ref 15–41)
Albumin: 4.1 g/dL (ref 3.5–5.0)
Alkaline Phosphatase: 54 U/L (ref 38–126)
Anion gap: 9 (ref 5–15)
BUN: 33 mg/dL — ABNORMAL HIGH (ref 6–20)
CO2: 24 mmol/L (ref 22–32)
Calcium: 8.7 mg/dL — ABNORMAL LOW (ref 8.9–10.3)
Chloride: 106 mmol/L (ref 101–111)
Creatinine, Ser: 2.41 mg/dL — ABNORMAL HIGH (ref 0.44–1.00)
GFR calc Af Amer: 23 mL/min — ABNORMAL LOW (ref >60)
GFR calc non Af Amer: 20 mL/min — ABNORMAL LOW (ref >60)
Glucose, Bld: 109 mg/dL — ABNORMAL HIGH (ref 65–99)
Potassium: 3.7 mmol/L (ref 3.5–5.1)
Sodium: 139 mmol/L (ref 135–145)
Total Bilirubin: 0.3 mg/dL (ref 0.3–1.2)
Total Protein: 7.6 g/dL (ref 6.5–8.1)

## 2017-01-14 LAB — CBC
HCT: 19.9 % — ABNORMAL LOW (ref 36.0–46.0)
Hemoglobin: 6.9 g/dL — CL (ref 12.0–15.0)
MCH: 36.3 pg — ABNORMAL HIGH (ref 26.0–34.0)
MCHC: 34.7 g/dL (ref 30.0–36.0)
MCV: 104.7 fL — ABNORMAL HIGH (ref 78.0–100.0)
Platelets: 285 10*3/uL (ref 150–400)
RBC: 1.9 MIL/uL — ABNORMAL LOW (ref 3.87–5.11)
RDW: 17.7 % — ABNORMAL HIGH (ref 11.5–15.5)
WBC: 4.2 10*3/uL (ref 4.0–10.5)

## 2017-01-14 LAB — I-STAT TROPONIN, ED: Troponin i, poc: 0.01 ng/mL (ref 0.00–0.08)

## 2017-01-14 LAB — ABO/RH: ABO/RH(D): B POS

## 2017-01-14 LAB — POC OCCULT BLOOD, ED: Fecal Occult Bld: POSITIVE — AB

## 2017-01-14 IMAGING — CR DG CHEST 2V
2 series · 2 of 2 positions shown · non-contrast
Comparison: Chest radiograph performed [DATE]

CLINICAL DATA: Chronically decreased hemoglobin. Initial encounter.

EXAM:
CHEST  2 VIEW

[w chest pa]
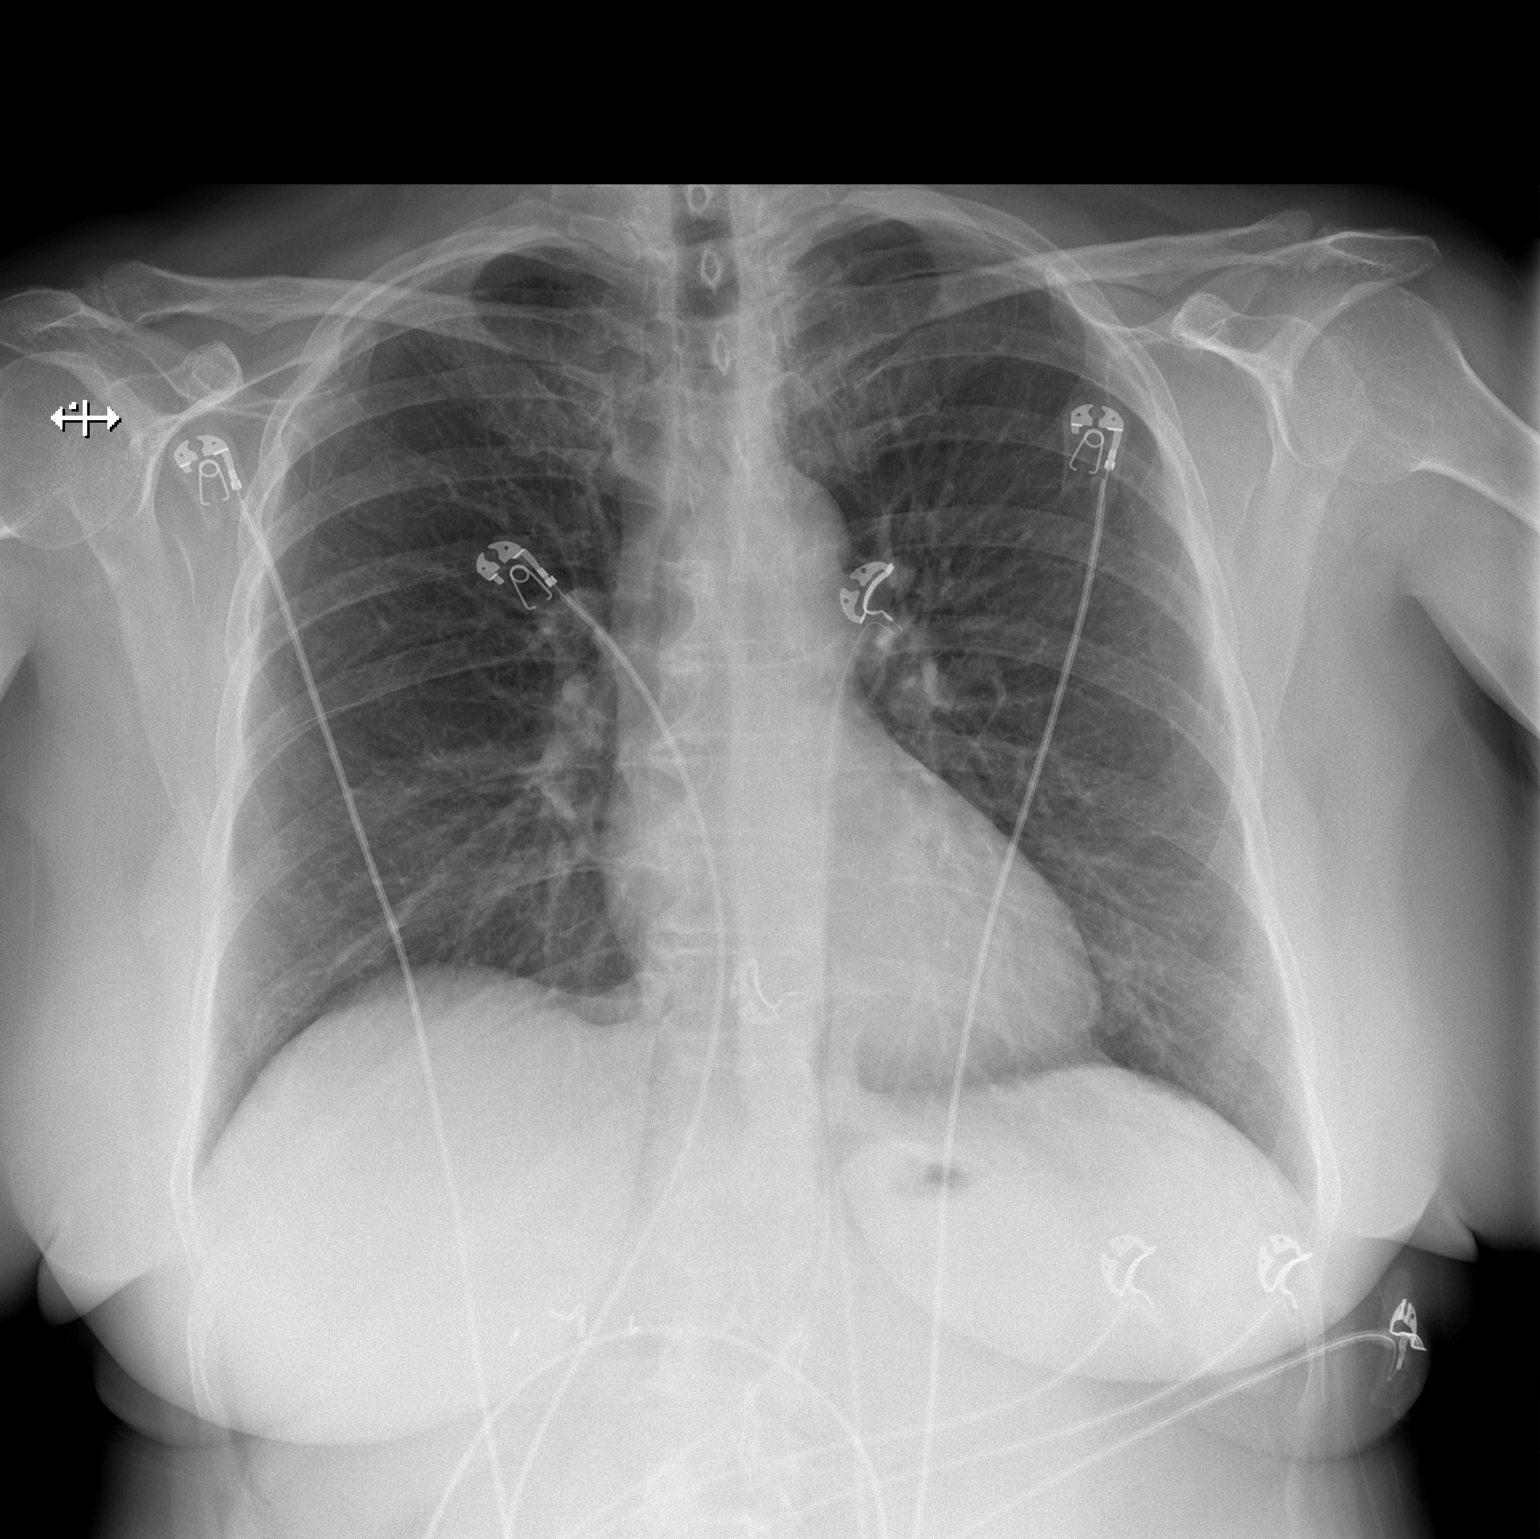

[w chest lat]
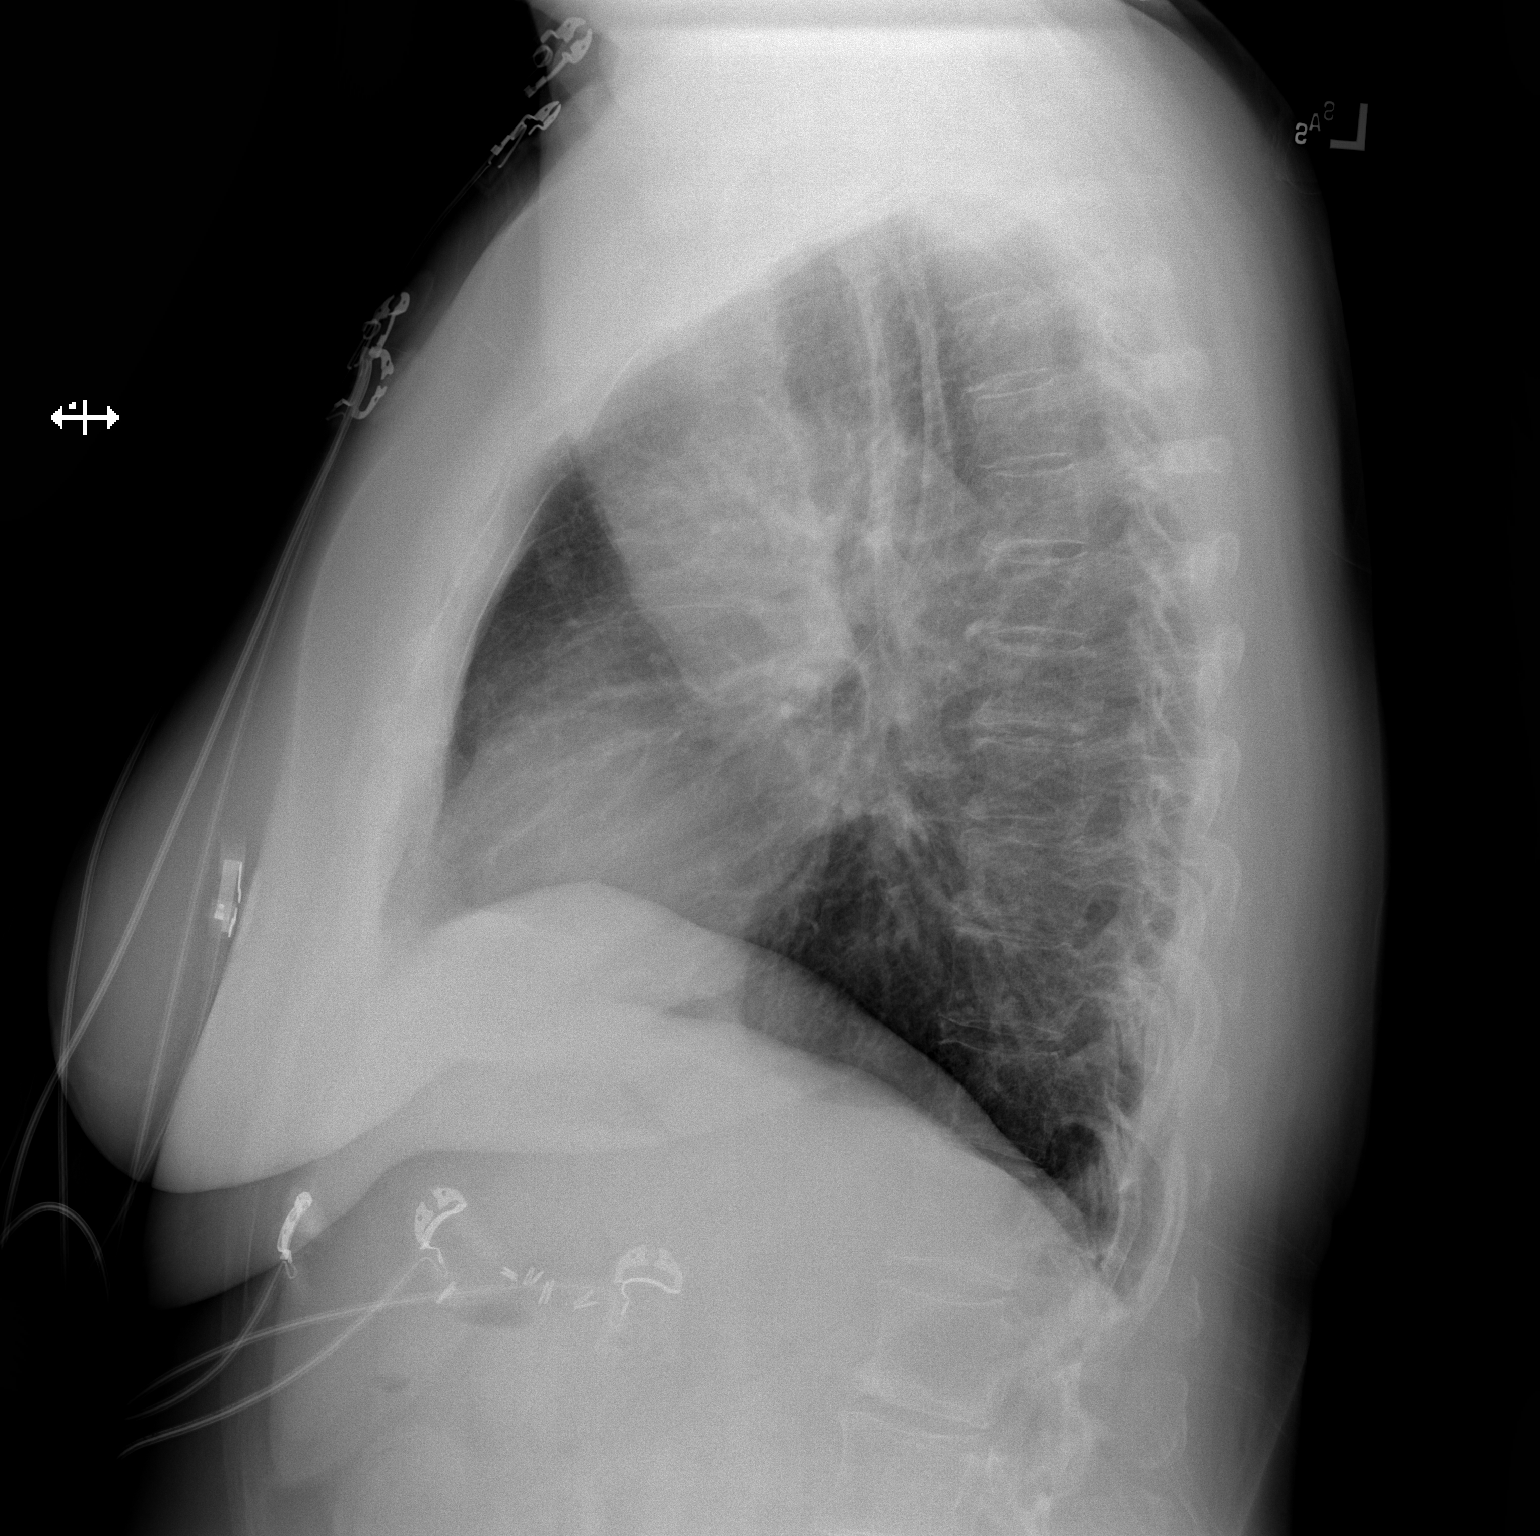

[2 of 2 positions shown; findings below may reference images not displayed]

FINDINGS: The lungs are well-aerated and clear. There is no evidence of focal
opacification, pleural effusion or pneumothorax.

The heart is normal in size; the mediastinal contour is within
normal limits. No acute osseous abnormalities are seen. Clips are
noted within the right upper quadrant, reflecting prior
cholecystectomy.
IMPRESSION: No acute cardiopulmonary process seen.

## 2017-01-14 MED ORDER — SODIUM CHLORIDE 0.9 % IV SOLN
80.0000 mg | Freq: Once | INTRAVENOUS | Status: AC
  Administered 2017-01-14: 80 mg via INTRAVENOUS
  Filled 2017-01-14: qty 80

## 2017-01-14 MED ORDER — SODIUM CHLORIDE 0.9 % IV SOLN
10.0000 mL/h | Freq: Once | INTRAVENOUS | Status: AC
  Administered 2017-01-14: 10 mL/h via INTRAVENOUS

## 2017-01-14 NOTE — Patient Instructions (Addendum)
Medication Instructions:  No changes.  Labwork: Today - BMET, CBC, TSH AND VITAMIN D  Testing/Procedures: Schedule an echocardiogram to evaluate shortness of breath and chest pain.    Follow-Up: Dr. Liam Rogers in 3-4 weeks.   Any Other Special Instructions Will Be Listed Below (If Applicable). Continue to hydrate well with fluids and electrolyte solutions. Stand up slowly. Get over the counter, knee high, compression stockings.  Wear them from the time you get up in the morning until you go to bed to prevent blood pooling in your legs which can cause dizziness.    If you need a refill on your cardiac medications before your next appointment, please call your pharmacy.

## 2017-01-14 NOTE — Progress Notes (Signed)
Cardiology Office Note:    Date:  01/14/2017   ID:  [name], DOB August 09, 1956, MRN [id number]  PCP:  Ann Held, DO  Cardiologist:  Dr. Liam Rogers   Nephrologist: Dr.Colodonato Gastroenterologist: Dr. Havery Moros  Referring MD: Carollee Herter, Alferd Apa, *   Chief Complaint  Patient presents with  . Dizziness  . Shortness of Breath    History of Present Illness:    [name] is a 61 y.o. female with a hx of Chest pain, CKD stage III, HTN, ulcerative colitis status post colectomy, Crohn's disease, HL. Myoview in 2008 was low risk. Last seen by Dr. Acie Fredrickson 10/17.  She presents today for the evaluation of dizziness and shortness of breath. He has had worsening diarrhea as well as nausea and vomiting over the past several weeks. She has chronic fatigue as well as chronic dyspnea and occasional chest pain. All of her symptoms seemed to have worsened. She decided to start pushing fluids and drinking Pedialyte. Since then, she is improved and she feels better. She does note a few episodes of near syncope with standing. She denies frank syncope. She has chronic tachycardia. This is essentially unchanged. She describes occasional chest pressure. This is not exertional. It seems to be random. She feels the chest pain is worse over the past several months.   Prior CV studies:   The following studies were reviewed today:  Myoview 4/08 No ischemia, EF 74   Past Medical History:  Diagnosis Date  . Abdominal pain    Hx  . Anal stenosis   . Anemia   . Anxiety   . Asthma    patient does not have inhaler  . Blood in stool    Hx  . Blood in urine    Hx  . De Quervain's tenosynovitis   . Depression   . Difficulty urinating    Hx  . Easy bruising   . Esophagitis   . Fainting    History - resolved - due to dehydration  . Fatigue    Hx  . Fibroid    Hx  . Gastric polyp   . GERD (gastroesophageal reflux disease)   . Hearing loss    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 aid - 80%  loss  . Hemorrhoids, external   . Hemorrhoids, internal   . Herpes, genital    vaginal treated 07/05/12 and pt states is resolved  . History of cervical dysplasia   . History of small bowel obstruction   . Hyperlipidemia    currently no meds  . Hyperparathyroidism   . Hypertension   . Hypokalemia    Hx  . 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 Obesity   . Ovarian cyst   . Poor dental hygiene   . Pulmonary nodule, right    right upper lobe  . Rectal bleeding    Hx  . Rectal pain    Hx  . Renal insufficiency    CKD - stage 2  . RLS (restless legs syndrome)    no meds  . Tooth infection 11/2016   right low  . Ulcerative colitis   . Visual disturbance    wears glasses  . Weakness generalized    Hx - patient denies generalized weakness  . Wears dentures    upper only    Past Surgical History:  Procedure Laterality Date  .  ANAL DILATION    . CERVICAL BIOPSY  W/ LOOP ELECTRODE EXCISION    . CHOLECYSTECTOMY    . COLONOSCOPY     [name]  . fatty tumor removed from back     X 2  . HEMORRHOID SURGERY    . ILEOSTOMY CLOSURE    . RESTORATIVE PROCTOCOLECTOMY     with insertion of ileoanal J Pouch with loop ileostomy  . TOTAL ABDOMINAL HYSTERECTOMY  1998   TAH/LSO  . TUBAL LIGATION    . UPPER GASTROINTESTINAL ENDOSCOPY     [name]    Current Medications: Current Meds  Medication Sig  . BIOTIN PO Take by mouth daily.  . calcitRIOL (ROCALTROL) 0.25 MCG capsule Take 0.25 mcg by mouth every other day. Mon, Wed, Fri  . DULoxetine (CYMBALTA) 60 MG capsule Take 2 capsules (120 mg total) by mouth daily.  Marland Kitchen HYDROcodone-acetaminophen (NORCO/VICODIN) 5-325 MG tablet Take 2 tablets by mouth every 4 (four) hours as needed for moderate pain or severe pain.  . Melatonin 2.5 MG CAPS Take by mouth 1 day or 1 dose.  . mercaptopurine (PURINETHOL) 50 MG tablet TAKE 1 TABLET (50 MG TOTAL) BY MOUTH  DAILY.  . metoprolol succinate (TOPROL-XL) 50 MG 24 hr tablet Take 1 tablet (50 mg total) by mouth daily.  . Multiple Vitamins-Minerals (MULTIVITAL) CHEW Chew by mouth. Reported on 03/23/2016  . Probiotic Product (VSL#3) CAPS Take 1 tablet daily by mouth  . ranitidine (ZANTAC) 150 MG tablet TAKE ONE TABLET BY MOUTH TWICE DAILY  . ULORIC 40 MG tablet TAKE 1 TABLET (40 MG TOTAL) BY MOUTH DAILY.  . vedolizumab (ENTYVIO) 300 MG injection Inject into the vein.     Allergies:   Morphine and Sulfonamide derivatives   Social History   Social History  . Marital status: Divorced    Spouse name: N/A  . Number of children: N/A  . Years of education: N/A   Social History Main Topics  . Smoking status: Former Smoker    Packs/day: 1.00    Years: 4.00    Types: Cigarettes    Quit date: 05/21/1979  . Smokeless tobacco: Never Used  . Alcohol use No  . Drug use: No  . Sexual activity: Yes    Birth control/ protection: Post-menopausal   Other Topics Concern  . Not on file   Social History Narrative  . No narrative on file     Family Hx: The patient's family history includes Cancer in her maternal uncle and sister; Diabetes in her sister; Heart attack in her father; Heart disease in her mother; Hypertension in her father and mother; Ulcerative colitis in her daughter and father. There is no history of Colon cancer, Esophageal cancer, Stomach cancer, or Rectal cancer.  ROS:   Please see the history of present illness.    Review of Systems  Constitution: Positive for malaise/fatigue.  Eyes: Positive for visual disturbance.  Cardiovascular: Positive for dyspnea on exertion.  Gastrointestinal: Positive for diarrhea, hematochezia (chronic without sig change;  chronic anal leakage), nausea and vomiting.  Genitourinary: Positive for incomplete emptying.  Neurological: Positive for dizziness, headaches and loss of balance.  Psychiatric/Behavioral: Positive for depression. The patient is  nervous/anxious.    All other systems reviewed and are negative.   EKGs/Labs/Other Test Reviewed:    EKG:  EKG is  ordered today.  The ekg ordered today demonstrates NSR, HR 95, normal axis, QTc 457 ms, no changes.  Recent Labs: 11/23/2016: ALT 14; BUN 40; Creatinine, Ser 2.09; Hemoglobin  10.1; Platelets 321.0; Potassium 3.5; Sodium 137   Recent Lipid Panel    Component Value Date/Time   CHOL 199 11/22/2014 1017   TRIG 156.0 (H) 11/22/2014 1017   TRIG 161 (H) 09/19/2006 0940   HDL 45.80 11/22/2014 1017   CHOLHDL 4 11/22/2014 1017   VLDL 31.2 11/22/2014 1017   LDLCALC 122 (H) 11/22/2014 1017   LDLDIRECT 127.0 09/19/2006 0940     Physical Exam:    VS:  BP 120/66   Pulse 95   Ht 5\' 3"  (1.6 m)   Wt 181 lb (82.1 kg)   SpO2 100%   BMI 32.06 kg/m     Orthostatic VS for the past 24 hrs (Last 3 readings):  BP- Lying Pulse- Lying BP- Sitting Pulse- Sitting BP- Standing at 0 minutes Pulse- Standing at 0 minutes BP- Standing at 3 minutes Pulse- Standing at 3 minutes  01/14/17 0916 146/81 95 127/66 100 117/72 105 121/78 100     Wt Readings from Last 3 Encounters:  01/14/17 181 lb (82.1 kg)  01/13/17 180 lb (81.6 kg)  11/23/16 182 lb (82.6 kg)     Physical Exam  Constitutional: She is oriented to person, place, and time. She appears well-developed and well-nourished. No distress.  HENT:  Head: Normocephalic and atraumatic.  Eyes: No scleral icterus.  Neck: Normal range of motion. No JVD present. Carotid bruit is not present.  Cardiovascular: Normal rate, regular rhythm, S1 normal, S2 normal and normal heart sounds.   No murmur heard. Pulmonary/Chest: Breath sounds normal. She has no wheezes. She has no rhonchi. She has no rales.  Abdominal: Soft. There is no tenderness.  Musculoskeletal: She exhibits no edema.  Neurological: She is alert and oriented to person, place, and time.  Skin: Skin is warm and dry.  Psychiatric: She has a normal mood and affect.    ASSESSMENT:      1. Shortness of breath   2. Other chest pain   3. Benign essential HTN   4. Chronic kidney disease (CKD), stage II (mild)   5. Crohn's disease of colon with complication ([place])    PLAN:    In order of problems listed above:  1. Shortness of breath -  I suspect her symptoms are all related to dehydration in the setting of inflammatory bowel disease.  Her BP does drop with standing today.  She has chronic GI blood loss.  She has a hx of Hgb in the 10 range.  She has CKD and her SCr ~ 2.  She has a hx of chest pain and thinks her chest pain has been worse.  However, with rehydration, she has felt much better for the last few days.  Her ECG shows no changes.  -  Obtain echocardiogram to further evaluate shortness of breath   -  Labs: BMET, CBC, TSH  -  FU in 3-4 weeks.   2. Other chest pain - S/w atypical chest pain.  Nuc study in 2008 was low risk.  She has a FHx of CAD, prior smoking hx. She would be at high risk for CIN with cardiac cath given her CKD. I would avoid stress testing for now.  If her echo is normal and her labs are stable and she has continued/worsening chest pain, consider stress nuclear study (looking for high risk features only).    3. Benign essential HTN - BP does drop with standing today.  I have recommended she hydrate well and wear compression stockings.  I suspect she  is dehydrated from diarrhea, nausea and vomiting.  Check labs as noted.    4. Chronic kidney disease (CKD), stage II (mild) - As noted, she is high risk for contrast induced nephropathy.  Therefore, I do not recommend stress testing at this point unless symptoms persist/worsen.   5. Crohn's disease of colon with complication ([place]) - FU with GI as planned. She wants her Vit D rechecked today. This will be obtained per her request.   Dispo:  Return in about 4 weeks (around 02/11/2017) for Routine Follow Up, w/ Dr. Acie Fredrickson.   Medication Adjustments/Labs and Tests Ordered: Current medicines are reviewed  at length with the patient today.  Concerns regarding medicines are outlined above.  Orders/Tests:  Orders Placed This Encounter  Procedures  . Basic metabolic panel  . CBC  . TSH  . VITAMIN D 25 Hydroxy (Vit-D Deficiency, Fractures)  . EKG 12-Lead  . ECHOCARDIOGRAM COMPLETE   Medication changes: No orders of the defined types were placed in this encounter.  Signed, Richardson Dopp, PA-C  01/14/2017 10:27 AM    [facility] [address], [place], [place]  [address] Phone: [phone]; Fax: [phone]

## 2017-01-14 NOTE — ED Notes (Signed)
Date and time results received: 01/14/17 1955 (use smartphrase ".now" to insert current time)  Test: HGB Critical Value: 6.9  Name of Provider Notified: Dr.Ray  Orders Received? Or Actions Taken?: No orders at this time until pt is evaluated

## 2017-01-14 NOTE — ED Provider Notes (Signed)
[facility] DEPT Provider Note   CSN: [id number] Arrival date & time: 01/14/17  1810  By signing my name below, I, [name], attest that this documentation has been prepared under the direction and in the presence of  [name], [employer]. Electronically Signed: Hansel [name], ED Scribe. 01/14/17. 11:01 PM.    History   Chief Complaint Chief Complaint  Patient presents with  . Anemia    HPI Simonne [name] is a 61 y.o. female with an extensive PMHx as listed below, most significant for ulcerative colitis with ileal pouchitis, mild chronic anemia, hemorrhoids, anal fissures, HTN, HLD, and CKD2, who presents to the Emergency Department, referred by Cardiologist, for evaluation of Hgb of 7.0 that was drawn at her cardiologist's office this morning. Per pt, she visited the cardiologist for c/o DOE, fatigue and occasional lightheadedness for a few weeks; states that she actually feels better today than she had in a few weeks, and felt like she was "wasting his time". They got blood and when her hgb level was found to be low, they advised her to come here for further evaluation and management. Pt additionally complains of occasional nausea in the mornings a few days last week, but none ongoing. Pt states she currently feels well. Pt states her current symptoms are similar to prior bouts of dehydration, and reports she has been trying to stay hydrated by drinking Pedialyte at home. No other treatments tried for symptoms PTA. Only aggravating factor has been exertion. She also notes darker bluish stools a few days ago, but attributes this to consuming blueberries. Pt has extensive PSMx, including restorative proctocolectomy w/ insertion of ileoanal J pouch w/ loop ileostomy. Pt reports h/o iron administration with prior episodes of anemia, but no h/o blood transfusion. Denies hx of CHF. Has chronic abd pain at baseline, and chronic diarrhea at baseline. Denies black/tarry stools, hematochezia, fevers,  chills, CP, leg swelling, recent travel/surgery/prolonged periods of immobilization, estrogen use, h/o PE/DVT, FHx of PE/DVT, new/worsening abd pain, current nausea, vomiting, abnormal or new diarrhea, constipation, hematuria, dysuria, myalgias, arthralgias, numbness, tingling, diaphoresis, focal weakness, or any other complaints at this time.   Pt is followed by Cardiologist Dr. Acie Fredrickson, and GI Dr. Enis Gash with [facility]. PCP is Dr. Etter Sjogren at [facility].    The history is provided by the patient and medical records. No language interpreter was used.  Anemia  This is a new problem. The current episode started 12 to 24 hours ago. The problem occurs constantly. The problem has not changed since onset.Associated symptoms include shortness of breath (on exertion). Pertinent negatives include no chest pain and no abdominal pain. The symptoms are aggravated by walking and exertion. Nothing relieves the symptoms. She has tried nothing for the symptoms. The treatment provided no relief.    Past Medical History:  Diagnosis Date  . Abdominal pain    Hx  . Anal stenosis   . Anemia   . Anxiety   . Asthma    patient does not have inhaler  . Blood in stool    Hx  . Blood in urine    Hx  . De Quervain's tenosynovitis   . Depression   . Difficulty urinating    Hx  . Easy bruising   . Esophagitis   . Fainting    History - resolved - due to dehydration  . Fatigue    Hx  . Fibroid    Hx  . Gastric polyp   . GERD (gastroesophageal reflux disease)   .  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 Hemorrhoids, external   . Hemorrhoids, internal   . Herpes, genital    vaginal treated 07/05/12 and pt states is resolved  . History of cervical dysplasia   . History of small bowel obstruction   . Hyperlipidemia    currently no meds  . Hyperparathyroidism   . Hypertension   . Hypokalemia    Hx  . IBD (inflammatory bowel disease)    initially colectomy for suspected UC, now with Crohns of  the pouch versus chronic pouchitis  . Obesity   . Ovarian cyst   . Poor dental hygiene   . Pulmonary nodule, right    right upper lobe  . Rectal bleeding    Hx  . Rectal pain    Hx  . Renal insufficiency    CKD - stage 2  . RLS (restless legs syndrome)    no meds  . Tooth infection 11/2016   right low  . Ulcerative colitis   . Visual disturbance    wears glasses  . Weakness generalized    Hx - patient denies generalized weakness  . Wears dentures    upper only    Patient Active Problem List   Diagnosis Date Noted  . Crohn's colitis ([place]) 05/19/2016  . 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[place]) 12/01/2015  . Rectal pain 12/01/2015  . IBD (inflammatory bowel disease) 10/24/2015  . Anal stricture 10/24/2015  . Gout of foot 10/29/2014  . Ovarian cyst, right 06/12/2014  . Acute on chronic renal failure ([place]) 04/02/2013  . Hydrosalpinx 02/14/2013  . Hyperparathyroidism ([place]) 09/18/2012  . HSV (herpes simplex virus) anogenital infection 06/28/2012  . Chronic kidney disease, stage II (mild) 06/17/2012  . LLQ abdominal pain 06/22/2011  . RESTLESS LEGS SYNDROME 08/05/2009  . PULMONARY NODULE, RIGHT UPPER LOBE 02/28/2009  . TOBACCO ABUSE, HX OF 02/11/2009  . PAIN IN JOINT, MULTIPLE SITES 02/04/2009  . Nonspecific (abnormal) findings on radiological and other examination of body structure 12/23/2008  . CT, CHEST, ABNORMAL 12/23/2008  . HEMANGIOMA SIMPLEX 12/16/2008  . Pain in joint, pelvic region and thigh 12/16/2008  . SKIN RASH 12/16/2008  . TINEA CORPORIS 04/18/2008  . LATERAL EPICONDYLITIS, RIGHT 04/18/2008  . OBESITY, UNSPECIFIED 02/05/2008  . ANEMIA 02/05/2008  . ANAL STENOSIS 02/05/2008  . Diarrhea, unspecified 02/05/2008  . Personal history of other endocrine, metabolic, and immunity disorders 02/05/2008  . Personal history of other diseases of digestive system 02/05/2008  . SYNCOPE AND COLLAPSE 02/02/2008  . HYPONATREMIA 12/15/2007  . DEHYDRATION 12/15/2007  . HYPOKALEMIA  12/15/2007  . ANEMIA-IRON DEFICIENCY 12/15/2007  . LEUKOCYTOSIS 12/15/2007  . ANXIETY 12/15/2007  . RENAL INSUFFICIENCY 11/16/2007  . TINNITUS NOS 05/25/2007  . DE QUERVAIN'S TENOSYNOVITIS 05/25/2007  . HYPERLIPIDEMIA 10/23/2006  . DEPRESSION 10/23/2006  . Benign essential HTN 10/23/2006  . GERD 10/23/2006  . Gastro-esophageal reflux disease without esophagitis 10/23/2006  . Ulcerative colitis without complications ([place]) 28/78/6767  . GASTRIC POLYP 11/16/2001  . ESOPHAGITIS 11/16/2001    Past Surgical History:  Procedure Laterality Date  . ANAL DILATION    . CERVICAL BIOPSY  W/ LOOP ELECTRODE EXCISION    . CHOLECYSTECTOMY    . COLONOSCOPY     [name]  . fatty tumor removed from back     X 2  . HEMORRHOID SURGERY    . ILEOSTOMY CLOSURE    . RESTORATIVE PROCTOCOLECTOMY     with insertion of ileoanal J Pouch with loop  ileostomy  . TOTAL ABDOMINAL HYSTERECTOMY  1998   TAH/LSO  . TUBAL LIGATION    . UPPER GASTROINTESTINAL ENDOSCOPY     Olevia Perches    OB History    Gravida Para Term Preterm AB Living   4 2 2   2 2    SAB TAB Ectopic Multiple Live Births   2       2       Home Medications    Prior to Admission medications   Medication Sig Start Date End Date Taking? Authorizing Provider  BIOTIN PO Take by mouth daily.   Yes [provider]  calcitRIOL (ROCALTROL) 0.25 MCG capsule Take 0.25 mcg by mouth every other day. Mon, Wed, Fri   Yes [provider]  DULoxetine (CYMBALTA) 60 MG capsule Take 2 capsules (120 mg total) by mouth daily. 11/30/16  Yes Ann Held, DO  HYDROcodone-acetaminophen (NORCO/VICODIN) 5-325 MG tablet Take 2 tablets by mouth every 4 (four) hours as needed for moderate pain or severe pain. 11/23/16  Yes Ann Held, DO  Melatonin 2.5 MG CAPS Take 2.5 mg by mouth at bedtime.    Yes [provider]  mercaptopurine (PURINETHOL) 50 MG tablet TAKE 1 TABLET (50 MG TOTAL) BY MOUTH DAILY. 12/31/16 03/31/17 Yes  Armbruster, Renelda Loma, MD  metoprolol succinate (TOPROL-XL) 50 MG 24 hr tablet Take 1 tablet (50 mg total) by mouth daily. 06/25/16  Yes Nahser, Wonda Cheng, MD  Multiple Vitamins-Minerals (MULTIVITAL) CHEW Chew 1 tablet by mouth daily. Reported on 03/23/2016   Yes [provider]  ranitidine (ZANTAC) 150 MG tablet TAKE ONE TABLET BY MOUTH TWICE DAILY Patient taking differently: TAKE 150MG  BY MOUTH AT BEDTIME 11/08/16  Yes Armbruster, Renelda Loma, MD  ULORIC 40 MG tablet TAKE 1 TABLET (40 MG TOTAL) BY MOUTH DAILY. 08/03/16  Yes Roma Schanz R, DO  vedolizumab (ENTYVIO) 300 MG injection Inject 300 mg into the vein as directed. EVERY 8 WEEKS   Yes [provider]  Probiotic Product (VSL#3) CAPS Take 1 tablet daily by mouth Patient not taking: Reported on 01/14/2017 08/29/15   Armbruster, Renelda Loma, MD    Family History Family History  Problem Relation Age of Onset  . Ulcerative colitis Father   . Hypertension Father   . Heart attack Father   . Hypertension Mother   . Heart disease Mother        s/p pci  . Ulcerative colitis Daughter   . Irritable bowel syndrome Unknown        grandchildren  . Diabetes Sister   . Cancer Sister        uterine  . Cancer Maternal Uncle        LUNG  . Colon cancer Neg Hx   . Esophageal cancer Neg Hx   . Stomach cancer Neg Hx   . Rectal cancer Neg Hx     Social History Social History  Substance Use Topics  . Smoking status: Former Smoker    Packs/day: 1.00    Years: 4.00    Types: Cigarettes    Quit date: 05/21/1979  . Smokeless tobacco: Never Used  . Alcohol use No     Allergies   Morphine and Sulfonamide derivatives   Review of Systems Review of Systems  Constitutional: Positive for fatigue. Negative for chills, diaphoresis and fever.  Respiratory: Positive for shortness of breath (on exertion).   Cardiovascular: Negative for chest pain and leg swelling.  Gastrointestinal: Positive for blood in  stool (slightly  darker stools, no obvious hematochezia) and nausea (intermittent, none ongoing). Negative for abdominal pain, constipation, diarrhea and vomiting.  Genitourinary: Negative for dysuria and hematuria.  Musculoskeletal: Negative for arthralgias and myalgias.  Skin: Negative for color change.  Allergic/Immunologic: Negative for immunocompromised state.  Neurological: Positive for light-headedness (intermittently). Negative for weakness and numbness.  Psychiatric/Behavioral: Negative for confusion.   A complete 10 system review of systems was obtained and all systems are negative except as noted in the HPI and PMH.    Physical Exam Updated Vital Signs BP (!) 179/88 (BP Location: Left Arm)   Pulse (!) 106   Temp 97.8 F (36.6 C) (Oral)   Resp (!) 24   Wt 187 lb (84.8 kg)   SpO2 94%   BMI 33.13 kg/m   Physical Exam  Constitutional: She is oriented to person, place, and time. Vital signs are normal. She appears well-developed and well-nourished.  Non-toxic appearance. No distress.  Afebrile, nontoxic, NAD  HENT:  Head: Normocephalic and atraumatic.  Mouth/Throat: Oropharynx is clear and moist and mucous membranes are normal.  Eyes: Conjunctivae and EOM are normal. Right eye exhibits no discharge. Left eye exhibits no discharge.  Neck: Normal range of motion. Neck supple.  Cardiovascular: Regular rhythm, normal heart sounds and intact distal pulses.  Tachycardia present.  Exam reveals no gallop and no friction rub.   No murmur heard. Mildly tachycardic in the low 100s, regular rhythm, nl s1/s2, no m/r/g, distal pulses intact, no pedal edema   Pulmonary/Chest: Effort normal and breath sounds normal. No respiratory distress. She has no decreased breath sounds. She has no wheezes. She has no rhonchi. She has no rales.  CTAB in all lung fields, no w/r/r, no hypoxia or increased WOB, speaking in full sentences, SpO2 95-98% on RA   Abdominal: Soft. Normal appearance and bowel sounds are normal.  She exhibits no distension. There is no tenderness. There is no rigidity, no rebound, no guarding, no CVA tenderness, no tenderness at McBurney's point and negative Murphy's sign.  Soft, NTND, +BS throughout, no r/g/r, neg murphy's, neg mcburney's, no CVA TTP   Musculoskeletal: Normal range of motion.  MAE x4 Strength and sensation grossly intact in all extremities Distal pulses intact No pedal edema, neg homan's bilaterally   Neurological: She is alert and oriented to person, place, and time. She has normal strength. No sensory deficit.  Skin: Skin is warm, dry and intact. No rash noted. There is pallor.  Mildly pale.   Psychiatric: She has a normal mood and affect.  Nursing note and vitals reviewed.    ED Treatments / Results   DIAGNOSTIC STUDIES: Oxygen Saturation is 94% on RA, adequate by my interpretation.    COORDINATION OF CARE: 10:34 PM Discussed treatment plan with pt at bedside which includes labs, CXR, hospital admission, and pt agreed to plan.    Labs (all labs ordered are listed, but only abnormal results are displayed) Labs Reviewed  COMPREHENSIVE METABOLIC PANEL - Abnormal; Notable for the following:       Result Value   Glucose, Bld 109 (*)    BUN 33 (*)    Creatinine, Ser 2.41 (*)    Calcium 8.7 (*)    GFR calc non Af Amer 20 (*)    GFR calc Af Amer 23 (*)    All other components within normal limits  CBC - Abnormal; Notable for the following:    RBC 1.90 (*)    Hemoglobin 6.9 (*)  HCT 19.9 (*)    MCV 104.7 (*)    MCH 36.3 (*)    RDW 17.7 (*)    All other components within normal limits  POC OCCULT BLOOD, ED - Abnormal; Notable for the following:    Fecal Occult Bld POSITIVE (*)    All other components within normal limits  VITAMIN B12  FOLATE  IRON AND TIBC  FERRITIN  RETICULOCYTES  I-STAT TROPOININ, ED  TYPE AND SCREEN  ABO/RH  PREPARE RBC (CROSSMATCH)    EKG  EKG Interpretation  Date/Time:  Friday Jan 14 2017 22:54:44 EDT Ventricular  Rate:  98 PR Interval:    QRS Duration: 82 QT Interval:  363 QTC Calculation: 464 R Axis:   11 Text Interpretation:  Sinus rhythm Low voltage, precordial leads RSR' in V1 or V2, right VCD or RVH New t wave inversion in lead 3 compared to ECG 9 years ago.  No STEMI Confirmed by [facility] MD, [place] [phone]) on 01/14/2017 11:50:19 PM       Radiology Dg Chest 2 View  Result Date: 01/14/2017 CLINICAL DATA:  Chronically decreased hemoglobin. Initial encounter. EXAM: CHEST  2 VIEW COMPARISON:  Chest radiograph performed 11/04/2014 FINDINGS: The lungs are well-aerated and clear. There is no evidence of focal opacification, pleural effusion or pneumothorax. The heart is normal in size; the mediastinal contour is within normal limits. No acute osseous abnormalities are seen. Clips are noted within the right upper quadrant, reflecting prior cholecystectomy. IMPRESSION: No acute cardiopulmonary process seen. Electronically Signed   By: Garald Balding M.D.   On: 01/14/2017 23:35    Procedures Procedures (including critical care time)  CRITICAL CARE-- symptomatic anemia requiring transfusion Performed by: Reece Agar, PA-C  Total critical care time: 45 minutes Critical care time was exclusive of separately billable procedures and treating other patients. Critical care was necessary to treat or prevent imminent or life-threatening deterioration. Critical care was time spent personally by me on the following activities: development of treatment plan with patient and/or surrogate as well as nursing, discussions with consultants, evaluation of patient's response to treatment, examination of patient, obtaining history from patient or surrogate, ordering and performing treatments and interventions, ordering and review of laboratory studies, ordering and review of radiographic studies, pulse oximetry and re-evaluation of patient's condition.   Medications Ordered in ED Medications  pantoprazole  (PROTONIX) 80 mg in sodium chloride 0.9 % 100 mL IVPB (80 mg Intravenous New Bag/Given 01/14/17 2343)  0.9 %  sodium chloride infusion (10 mL/hr Intravenous New Bag/Given 01/14/17 2343)     Initial Impression / Assessment and Plan / ED Course  I have reviewed the triage vital signs and the nursing notes.  Pertinent labs & imaging results that were available during my care of the patient were reviewed by me and considered in my medical decision making (see chart for details).     61 y.o. female here with abnormal lab called by her cardiology office; Hgb 7.0 there. Reports several weeks of feeling dehydrated, fatigued, lightheaded occasionally, and DOE. States today she actually feels better than she has in the last several weeks. Has had some nausea off and on, none currently. Reports several days of darker stools, which she attributed to eating blueberries. Labs today: FOBT+ (done from stool sample pt provided), CMP showing baseline kidney function, CBC with Hgb 6.9 (baseline is usually ~10, lowest she's ever had in our system was 9.5 in Feb 2016). On exam, no abdominal tenderness, slightly pale, mildly tachycardic, clear lungs, no  pedal edema or hypoxia. No hx of needing blood xfusion but has needed iron before; however this is the lowest she's ever gotten. Will proceed with blood xfusion, will get trop, EKG, and CXR to ensure no other etiology for her SOB, but likely just from her anemia. Will also get anemia panel. Will await CXR results and proceed with admission. Discussed case with my attending Dr. Sherry Ruffing who agrees with plan. Will also start on protonix.  11:54 PM CXR neg. EKG with some T wave inversions new from 2009, however no other acute ischemic findings, likely not actually a "new" finding. Trop not yet done but will go ahead and proceed with admission.   12:18 AM Dr. Loleta Books of [facility] returning page and will admit. Of note, troponin negative. Holding orders to be placed by admitting team.  Please see their notes for further documentation of care. I appreciate their help with this pleasant pt's care. Pt stable at time of admission.   I personally performed the services described in this documentation, which was scribed in my presence. The recorded information has been reviewed and is accurate.   Final Clinical Impressions(s) / ED Diagnoses   Final diagnoses:  Symptomatic anemia  Melena  Chronic GI bleeding  Lightheadedness  DOE (dyspnea on exertion)    New Prescriptions New Prescriptions   No medications on [address] [place], [place] 01/15/17 0018    Tegeler, Gwenyth Allegra, MD 01/15/17 1705

## 2017-01-14 NOTE — ED Triage Notes (Signed)
Pt was told to come here for evaluation for Hgb of 7.0. Hx of anemia, and has had to have iron transfusions before. No new rectal bleeding.

## 2017-01-15 ENCOUNTER — Encounter (HOSPITAL_COMMUNITY): Payer: Self-pay | Admitting: Family Medicine

## 2017-01-15 DIAGNOSIS — K91850 Pouchitis: Secondary | ICD-10-CM

## 2017-01-15 DIAGNOSIS — K50119 Crohn's disease of large intestine with unspecified complications: Secondary | ICD-10-CM | POA: Diagnosis not present

## 2017-01-15 DIAGNOSIS — D649 Anemia, unspecified: Secondary | ICD-10-CM | POA: Diagnosis not present

## 2017-01-15 DIAGNOSIS — N184 Chronic kidney disease, stage 4 (severe): Secondary | ICD-10-CM

## 2017-01-15 DIAGNOSIS — R0609 Other forms of dyspnea: Secondary | ICD-10-CM

## 2017-01-15 DIAGNOSIS — K922 Gastrointestinal hemorrhage, unspecified: Secondary | ICD-10-CM

## 2017-01-15 DIAGNOSIS — I10 Essential (primary) hypertension: Secondary | ICD-10-CM

## 2017-01-15 LAB — CBC
HCT: 25.9 % — ABNORMAL LOW (ref 36.0–46.0)
Hemoglobin: 8.9 g/dL — ABNORMAL LOW (ref 12.0–15.0)
MCH: 32.7 pg (ref 26.0–34.0)
MCHC: 34.4 g/dL (ref 30.0–36.0)
MCV: 95.2 fL (ref 78.0–100.0)
Platelets: 233 10*3/uL (ref 150–400)
RBC: 2.72 MIL/uL — ABNORMAL LOW (ref 3.87–5.11)
RDW: 21.1 % — ABNORMAL HIGH (ref 11.5–15.5)
WBC: 3.4 10*3/uL — ABNORMAL LOW (ref 4.0–10.5)

## 2017-01-15 LAB — IRON AND TIBC
Iron: 72 ug/dL (ref 28–170)
Saturation Ratios: 24 % (ref 10.4–31.8)
TIBC: 302 ug/dL (ref 250–450)
UIBC: 230 ug/dL

## 2017-01-15 LAB — LACTATE DEHYDROGENASE: LDH: 164 U/L (ref 98–192)

## 2017-01-15 LAB — PREPARE RBC (CROSSMATCH)

## 2017-01-15 LAB — FERRITIN: Ferritin: 196 ng/mL (ref 11–307)

## 2017-01-15 LAB — RETICULOCYTES
RBC.: 2.64 MIL/uL — ABNORMAL LOW (ref 3.87–5.11)
Retic Count, Absolute: 66 10*3/uL (ref 19.0–186.0)
Retic Ct Pct: 2.5 % (ref 0.4–3.1)

## 2017-01-15 LAB — TSH: TSH: 2.624 u[IU]/mL (ref 0.350–4.500)

## 2017-01-15 LAB — FOLATE: Folate: 48.8 ng/mL (ref >5.9)

## 2017-01-15 LAB — VITAMIN B12: Vitamin B-12: 281 pg/mL (ref 180–914)

## 2017-01-15 MED ORDER — FAMOTIDINE 20 MG PO TABS
20.0000 mg | ORAL_TABLET | Freq: Every day | ORAL | Status: DC
  Administered 2017-01-15: 20 mg via ORAL
  Filled 2017-01-15: qty 1

## 2017-01-15 MED ORDER — METOPROLOL SUCCINATE ER 50 MG PO TB24
50.0000 mg | ORAL_TABLET | Freq: Every day | ORAL | Status: DC
  Administered 2017-01-15: 50 mg via ORAL
  Filled 2017-01-15: qty 1

## 2017-01-15 MED ORDER — MELATONIN 2.5 MG PO CAPS: 2.5000 mg | ORAL_CAPSULE | Freq: Every day | ORAL | Status: DC

## 2017-01-15 MED ORDER — FERROUS SULFATE 325 (65 FE) MG PO TABS: 325.0000 mg | ORAL_TABLET | Freq: Every day | ORAL | 0 refills | Status: DC

## 2017-01-15 MED ORDER — HYDROCODONE-ACETAMINOPHEN 5-325 MG PO TABS: 2.0000 | ORAL_TABLET | ORAL | Status: DC | PRN

## 2017-01-15 MED ORDER — CALCITRIOL 0.25 MCG PO CAPS: 0.2500 ug | ORAL_CAPSULE | ORAL | Status: DC

## 2017-01-15 MED ORDER — DULOXETINE HCL 60 MG PO CPEP
120.0000 mg | ORAL_CAPSULE | Freq: Every day | ORAL | Status: DC
  Administered 2017-01-15: 120 mg via ORAL
  Filled 2017-01-15: qty 2

## 2017-01-15 MED ORDER — SODIUM CHLORIDE 0.9 % IV SOLN
INTRAVENOUS | Status: DC
  Administered 2017-01-15: 06:00:00 via INTRAVENOUS

## 2017-01-15 MED ORDER — BOOST / RESOURCE BREEZE PO LIQD
1.0000 | Freq: Three times a day (TID) | ORAL | Status: DC
  Administered 2017-01-15: 1 via ORAL
  Filled 2017-01-15 (×3): qty 1

## 2017-01-15 NOTE — H&P (Signed)
History and Physical  Patient Name: [name]     URK:[id number]    DOB: 1956/03/22    DOA: 01/14/2017 PCP: Ann Held, DO   Patient coming from: Home  Chief Complaint: Slowly progressive weakness, chest heaviness, anemia/abnormal lab  HPI: [name] is a 61 y.o. female with a past medical history significant for Crohns on 6-MP and Entyvio s/p total colectomy and chronic pouchitis, CKD IV who presents with fatigue and abnormal labs.  Over the last few weeks, the patient has had slowly progressive fatigue, dyspnea, chest heaviness. She thought this may be related to her heart, so she called her cardiologist and was seen 2 days ago. At that appointment they drew routine labs, and today her hemoglobin returned at 7, so she was called and told to proceed to the emergency room.  She has chronic small amt hematochezia, has for years, every few BMs or so, she attributes this to recurrent anal fissures from her Crohns.  She has had no hematuria, hematemesis, no larger volume hematochezia and no melena recently. Back in October, she started [facility] and 6-MP and has been doing well with this combination, and her Hgb in March was stable at 10 (but newly macrocytic).    ED course: -Temp 2F, heart rate 102, respirations 24, blood pressure 146/81, pulse oximetry 100% on room air -Na 139, K 3.7, Cr 2.41 (baseline 2.1), WBC 4.2 K, Hgb 6.9 and MCV 104 -FOBT+ no gross blood melena -Troponin normal -LFTs normal -CXR clear     ROS: Review of Systems  Constitutional: Positive for malaise/fatigue.  HENT: Negative for nosebleeds.   Respiratory: Positive for shortness of breath. Negative for cough, sputum production and wheezing.   Cardiovascular: Positive for chest pain (heaviness). Negative for orthopnea and leg swelling.  Gastrointestinal: Positive for blood in stool (chronic, no change). Negative for melena and vomiting.  Genitourinary: Negative for hematuria.  Skin: Negative for  rash.  Neurological: Positive for weakness.  All other systems reviewed and are negative.         Past Medical History:  Diagnosis Date  . Abdominal pain    Hx  . Anal stenosis   . Anemia   . Anxiety   . Asthma    patient does not have inhaler  . Blood in stool    Hx  . Blood in urine    Hx  . De Quervain's tenosynovitis   . Depression   . Difficulty urinating    Hx  . Easy bruising   . Esophagitis   . Fainting    History - resolved - due to dehydration  . Fatigue    Hx  . Fibroid    Hx  . Gastric polyp   . GERD (gastroesophageal reflux disease)   . 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 Hemorrhoids, external   . Hemorrhoids, internal   . Herpes, genital    vaginal treated 07/05/12 and pt states is resolved  . History of cervical dysplasia   . History of small bowel obstruction   . Hyperlipidemia    currently no meds  . Hyperparathyroidism   . Hypertension   . Hypokalemia    Hx  . 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 Obesity   . Ovarian cyst   . Poor dental hygiene   . Pulmonary  nodule, right    right upper lobe  . Rectal bleeding    Hx  . Rectal pain    Hx  . Renal insufficiency    CKD - stage 2  . RLS (restless legs syndrome)    no meds  . Tooth infection 11/2016   right low  . Ulcerative colitis   . Visual disturbance    wears glasses  . Weakness generalized    Hx - patient denies generalized weakness  . Wears dentures    upper only    Past Surgical History:  Procedure Laterality Date  . ANAL DILATION    . CERVICAL BIOPSY  W/ LOOP ELECTRODE EXCISION    . CHOLECYSTECTOMY    . COLONOSCOPY     [name]  . fatty tumor removed from back     X 2  . HEMORRHOID SURGERY    . ILEOSTOMY CLOSURE    . RESTORATIVE PROCTOCOLECTOMY     with insertion of ileoanal J Pouch with loop ileostomy  . TOTAL ABDOMINAL HYSTERECTOMY  1998    TAH/LSO  . TUBAL LIGATION    . UPPER GASTROINTESTINAL ENDOSCOPY     Olevia Perches    Social History: Patient lives in [place] independently.  The patient walks unassisted.  She is a former smoker. She worked in an [employer], is now disabled.    Allergies  Allergen Reactions  . Morphine Other (See Comments)    Gives me crazy dreams  . Sulfonamide Derivatives Itching, Swelling and Rash    Family history: family history includes Cancer in her maternal uncle and sister; Diabetes in her sister; Heart attack in her father; Heart disease in her mother; Hypertension in her father and mother; Ulcerative colitis in her daughter and father.  Prior to Admission medications   Medication Sig Start Date End Date Taking? Authorizing Provider  BIOTIN PO Take by mouth daily.   Yes [provider]  calcitRIOL (ROCALTROL) 0.25 MCG capsule Take 0.25 mcg by mouth every other day. Mon, Wed, Fri   Yes [provider]  DULoxetine (CYMBALTA) 60 MG capsule Take 2 capsules (120 mg total) by mouth daily. 11/30/16  Yes Ann Held, DO  HYDROcodone-acetaminophen (NORCO/VICODIN) 5-325 MG tablet Take 2 tablets by mouth every 4 (four) hours as needed for moderate pain or severe pain. 11/23/16  Yes Ann Held, DO  Melatonin 2.5 MG CAPS Take 2.5 mg by mouth at bedtime.    Yes [provider]  mercaptopurine (PURINETHOL) 50 MG tablet TAKE 1 TABLET (50 MG TOTAL) BY MOUTH DAILY. 12/31/16 03/31/17 Yes Armbruster, Renelda Loma, MD  metoprolol succinate (TOPROL-XL) 50 MG 24 hr tablet Take 1 tablet (50 mg total) by mouth daily. 06/25/16  Yes Nahser, Wonda Cheng, MD  Multiple Vitamins-Minerals (MULTIVITAL) CHEW Chew 1 tablet by mouth daily. Reported on 03/23/2016   Yes [provider]  ranitidine (ZANTAC) 150 MG tablet TAKE ONE TABLET BY MOUTH TWICE DAILY Patient taking differently: TAKE 150MG BY MOUTH AT BEDTIME 11/08/16  Yes Armbruster, Renelda Loma, MD  vedolizumab  (ENTYVIO) 300 MG injection Inject 300 mg into the vein as directed. EVERY 8 WEEKS   Yes [provider]       Physical Exam: BP 138/82   Pulse 93   Temp 98.5 F (36.9 C) (Oral)   Resp 20   Ht 5' 3"  (1.6 m)   Wt 82.6 kg (182 lb)   SpO2 97%   BMI 32.24 kg/m  General appearance: Elderly overweight  adult female, alert and in no acute distress.   Eyes: Anicteric, conjunctiva pink, lids and lashes normal. PERRL.    ENT: No nasal deformity, discharge, epistaxis.  Hearing normal. OP moist without lesions.   Neck: No neck masses.  Trachea midline.  No thyromegaly/tenderness. Lymph: No cervical or supraclavicular lymphadenopathy. Skin: Warm and dry.  No jaundice.  Pale.  No suspicious rashes or lesions. Cardiac: RRR, nl S1-S2, no murmurs appreciated.  Capillary refill is brisk.  JVP not visible.  No LE edema.  Radial and DP pulses 2+ and symmetric. Respiratory: Normal respiratory rate and rhythm.  CTAB without rales or wheezes. Abdomen: Abdomen soft.  No TTP. No ascites, distension, hepatosplenomegaly.   MSK: No deformities or effusions.  No cyanosis or clubbing. Neuro: Cranial nerves normal.  Sensation intact to light touch. Speech is fluent.  Muscle strength normal.    Psych: Sensorium intact and responding to questions, attention normal.  Behavior appropriate.  Affect normal.  Judgment and insight appear normal.     Labs on Admission:  I have personally reviewed following labs and imaging studies: CBC:  Recent Labs Lab 01/14/17 1009 01/14/17 1924  WBC 3.8 4.2  HGB  --  6.9*  HCT 20.6* 19.9*  MCV 104* 104.7*  PLT 297 240   Basic Metabolic Panel:  Recent Labs Lab 01/14/17 1924  NA 139  K 3.7  CL 106  CO2 24  GLUCOSE 109*  BUN 33*  CREATININE 2.41*  CALCIUM 8.7*   GFR: Estimated Creatinine Clearance: 24.3 mL/min (A) (by C-G formula based on SCr of 2.41 mg/dL (H)).  Liver Function Tests:  Recent Labs Lab 01/14/17 1924  AST 23  ALT 19  ALKPHOS 54    BILITOT 0.3  PROT 7.6  ALBUMIN 4.1   No results for input(s): LIPASE, AMYLASE in the last 168 hours. No results for input(s): AMMONIA in the last 168 hours. Coagulation Profile: No results for input(s): INR, PROTIME in the last 168 hours. Cardiac Enzymes: No results for input(s): CKTOTAL, CKMB, CKMBINDEX, TROPONINI in the last 168 hours. BNP (last 3 results) No results for input(s): PROBNP in the last 8760 hours. HbA1C: No results for input(s): HGBA1C in the last 72 hours. CBG: No results for input(s): GLUCAP in the last 168 hours. Lipid Profile: No results for input(s): CHOL, HDL, LDLCALC, TRIG, CHOLHDL, LDLDIRECT in the last 72 hours. Thyroid Function Tests:  Recent Labs  01/15/17 0100  TSH 2.624   Anemia Panel:  Recent Labs  01/14/17 2345  RETICCTPCT 2.5   Sepsis Labs: Invalid input(s): PROCALCITONIN, LACTICIDVEN No results found for this or any previous visit (from the past 240 hour(s)).       Radiological Exams on Admission: Personally reviewed CXR clear: Dg Chest 2 View  Result Date: 01/14/2017 CLINICAL DATA:  Chronically decreased hemoglobin. Initial encounter. EXAM: CHEST  2 VIEW COMPARISON:  Chest radiograph performed 11/04/2014 FINDINGS: The lungs are well-aerated and clear. There is no evidence of focal opacification, pleural effusion or pneumothorax. The heart is normal in size; the mediastinal contour is within normal limits. No acute osseous abnormalities are seen. Clips are noted within the right upper quadrant, reflecting prior cholecystectomy. IMPRESSION: No acute cardiopulmonary process seen. Electronically Signed   By: Garald Balding M.D.   On: 01/14/2017 23:35    EKG: Independently reviewed. Rate 98, QTc 464, no ST changes.       Assessment/Plan  1. Anemia:  Given no acceleration in her bleeding symptoms and worsening macrocytosis, I suspect this  is 6 MP effect, potentiated by Uloric. -Transfuse 1 unit now -Check B12, folate -Stop  Uloric -Hold 6-MP until evaluated by GI   2. Crohn's disease:  -Hold mercaptopurine -Consult GI, appreciate cares  3. Hypertension:  -Continue metoprolol  4. Chronic kidney disease:  Stage IV, severe. Baseline creatinine about 2, near baseline. -Transfuse -Continue calcitriol  5. Other medications:  -Continue duloxetine -Continue ranitidine -Continue Norco -Continue melatonin            DVT prophylaxis: SCDs  Code Status: FULL  Family Communication: None present  Disposition Plan: Anticipate transfusion, stop medciations, follow studies, eval by GI, likely home Feb 13, 2023 Consults called: None overnight Admission status: OBS At the point of initial evaluation, it is my clinical opinion that admission for OBSERVATION is reasonable and necessary because the patient's presenting complaints in the context of their chronic conditions represent sufficient risk of deterioration or significant morbidity to constitute reasonable grounds for close observation in the hospital setting, but that the patient may be medically stable for discharge from the hospital within 24 to 48 hours.    Medical decision making: Patient seen at 12:30 AM on 02/12/2017.  The patient was discussed with [id number], PA-C.  What exists of the patient's chart was reviewed in depth and summarized above.  Clinical condition: stable.        Edwin Dada [facility] Pager [phone]

## 2017-01-15 NOTE — Discharge Summary (Signed)
Physician Discharge Summary  [name] GTX:[id number] DOB: May 09, 1956 DOA: 01/14/2017  PCP: Ann Held, DO  Admit date: 01/14/2017 Discharge date: 01/15/2017  Admitted From: Home Disposition:  Home  Recommendations for Outpatient Follow-up:  1. Follow up with PCP in 1-2 weeks 2. Follow up with GI as already scheduled for 02/14/17 3. Recommend repeat cbc in 1-2 weeks   Discharge Condition:Heart healthy CODE STATUS:Full Diet recommendation: Heart healthy   Brief/Interim Summary: 61 y.o. female with a past medical history significant for Crohns on 6-MP and Entyvio s/p total colectomy and chronic pouchitis, CKD IV who presents with fatigue and abnormal labs.  Over the last few weeks, the patient has had slowly progressive fatigue, dyspnea, chest heaviness. She thought this may be related to her heart, so she called her cardiologist and was seen 2 days ago. At that appointment they drew routine labs, and today her hemoglobin returned at 7, so she was called and told to proceed to the emergency room.  She has chronic small amt hematochezia, has for years, every few BMs or so, she attributes this to recurrent anal fissures from her Crohns.  She has had no hematuria, hematemesis, no larger volume hematochezia and no melena recently. Back in October, she started [facility] and 6-MP and has been doing well with this combination, and her Hgb in March was stable at 10 (but newly macrocytic  1. Symptomatic Anemia:  - Transfused 2 units of PRBC's -Check B12, folate -Stop Uloric -Held 6-MP  - Post-transfusion hgb appropriately corrected from 6.9 to 8.9  2. Crohn's disease:  -Held mercaptopurine on admit -Discussed case with GI on call. Suspicion for chronic blood loss or effect of 6MP -GI recommendations to keep f/u appointment with primary GI on 02/14/17 - Pt to continue current regimen for now until re-evaluated by GI  3. Hypertension:  -Continued metoprolol - Remained  stable  4. Chronic kidney disease:  Stage IV, severe. Baseline creatinine about 2, near baseline. -Cr remained stable -Continue calcitriol  5. Other medications:  -Continued duloxetine -Continued ranitidine -Continued Norco -Continued melatonin  Discharge Diagnoses:  Principal Problem:   Symptomatic anemia Active Problems:   Benign essential HTN   CKD (chronic kidney disease), stage IV (HCC)   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HCC)   Crohn's colitis ([place])    Discharge Instructions   Allergies as of 01/15/2017      Reactions   Morphine Other (See Comments)   Gives me crazy dreams   Sulfonamide Derivatives Itching, Swelling, Rash      Medication List    TAKE these medications   BIOTIN PO Take by mouth daily.   calcitRIOL 0.25 MCG capsule Commonly known as:  ROCALTROL Take 0.25 mcg by mouth every other day. Mon, Wed, Fri   DULoxetine 60 MG capsule Commonly known as:  CYMBALTA Take 2 capsules (120 mg total) by mouth daily.   ENTYVIO 300 MG injection Generic drug:  vedolizumab Inject 300 mg into the vein as directed. EVERY 8 WEEKS   HYDROcodone-acetaminophen 5-325 MG tablet Commonly known as:  NORCO/VICODIN Take 2 tablets by mouth every 4 (four) hours as needed for moderate pain or severe pain.   Melatonin 2.5 MG Caps Take 2.5 mg by mouth at bedtime.   mercaptopurine 50 MG tablet Commonly known as:  PURINETHOL TAKE 1 TABLET (50 MG TOTAL) BY MOUTH DAILY.   metoprolol succinate 50 MG 24 hr tablet Commonly known as:  TOPROL-XL Take 1 tablet (50 mg total) by mouth daily.  MULTIVITAL Chew Chew 1 tablet by mouth daily. Reported on 03/23/2016   ranitidine 150 MG tablet Commonly known as:  ZANTAC TAKE ONE TABLET BY MOUTH TWICE DAILY What changed:  See the new instructions.      Follow-up Information    Ann Held, DO. Schedule an appointment as soon as possible for a visit in 2 week(s).   Specialty:  Family Medicine Contact information: [address] [place] [address] [phone]        Manus Gunning, MD. Schedule an appointment as soon as possible for a visit on 02/14/2017.   Specialty:  Gastroenterology Contact information: [address] [place] [address] [phone]          Allergies  Allergen Reactions  . Morphine Other (See Comments)    Gives me crazy dreams  . Sulfonamide Derivatives Itching, Swelling and Rash    Consultations:  Discussed case with Dr. Henrene Pastor of [facility]  Procedures/Studies: Dg Chest 2 View  Result Date: 01/14/2017 CLINICAL DATA:  Chronically decreased hemoglobin. Initial encounter. EXAM: CHEST  2 VIEW COMPARISON:  Chest radiograph performed 11/04/2014 FINDINGS: The lungs are well-aerated and clear. There is no evidence of focal opacification, pleural effusion or pneumothorax. The heart is normal in size; the mediastinal contour is within normal limits. No acute osseous abnormalities are seen. Clips are noted within the right upper quadrant, reflecting prior cholecystectomy. IMPRESSION: No acute cardiopulmonary process seen. Electronically Signed   By: Garald Balding M.D.   On: 01/14/2017 23:35    Subjective: No complaints. Denies abd pain, sob, or nausea  Discharge Exam: Vitals:   01/15/17 0347 01/15/17 0605  BP: 138/82 (!) 147/85  Pulse: 96 93  Resp: 16 20  Temp: 98 F (36.7 C) 98.5 F (36.9 C)   Vitals:   01/15/17 0243 01/15/17 0315 01/15/17 0347 01/15/17 0605  BP: (!) 167/90 (!) 145/88 138/82 (!) 147/85  Pulse: (!) 101 96 96 93  Resp: 16 16 16 20   Temp: 98.7 F (37.1 C) 98.6 F (37 C) 98 F (36.7 C) 98.5 F (36.9 C)  TempSrc: Oral Oral Oral Oral  SpO2: 98% 97% 97% 97%  Weight:      Height:        General: Pt is alert, awake, not in acute distress Cardiovascular: RRR, S1/S2 +, no rubs, no gallops Respiratory: CTA bilaterally, no wheezing, no rhonchi Abdominal: Soft, NT, ND, bowel sounds + Extremities: no edema, no  cyanosis   The results of significant diagnostics from this hospitalization (including imaging, microbiology, ancillary and laboratory) are listed below for reference.     Microbiology: No results found for this or any previous visit (from the past 240 hour(s)).   Labs: BNP (last 3 results) No results for input(s): BNP in the last 8760 hours. Basic Metabolic Panel:  Recent Labs Lab 01/14/17 1924  NA 139  K 3.7  CL 106  CO2 24  GLUCOSE 109*  BUN 33*  CREATININE 2.41*  CALCIUM 8.7*   Liver Function Tests:  Recent Labs Lab 01/14/17 1924  AST 23  ALT 19  ALKPHOS 54  BILITOT 0.3  PROT 7.6  ALBUMIN 4.1   No results for input(s): LIPASE, AMYLASE in the last 168 hours. No results for input(s): AMMONIA in the last 168 hours. CBC:  Recent Labs Lab 01/14/17 1009 01/14/17 1924 01/15/17 0855  WBC 3.8 4.2 3.4*  HGB  --  6.9* 8.9*  HCT 20.6* 19.9* 25.9*  MCV 104* 104.7* 95.2  PLT 297 285 233   Cardiac Enzymes: No results for input(s): CKTOTAL, CKMB, CKMBINDEX, TROPONINI in the last 168 hours. BNP: Invalid input(s): POCBNP CBG: No results for input(s): GLUCAP in the last 168 hours. D-Dimer No results for input(s): DDIMER in the last 72 hours. Hgb A1c No results for input(s): HGBA1C in the last 72 hours. Lipid Profile No results for input(s): CHOL, HDL, LDLCALC, TRIG, CHOLHDL, LDLDIRECT in the last 72 hours. Thyroid function studies  Recent Labs  01/15/17 0100  TSH 2.624   Anemia work up  Recent Labs  01/14/17 2345  RETICCTPCT 2.5   Urinalysis    Component Value Date/Time   COLORURINE YELLOW 04/12/2013 0900   APPEARANCEUR CLOUDY (A) 04/12/2013 0900   LABSPEC 1.022 04/12/2013 0900   PHURINE 5.5 04/12/2013 0900   GLUCOSEU NEGATIVE 04/12/2013 0900   GLUCOSEU NEGATIVE 03/14/2009 1603   HGBUR NEGATIVE 04/12/2013 0900   BILIRUBINUR Neg 12/14/2013 0940   KETONESUR NEGATIVE 04/12/2013 0900   PROTEINUR 100 12/14/2013 0940   PROTEINUR NEGATIVE  04/12/2013 0900   UROBILINOGEN 0.2 12/14/2013 0940   UROBILINOGEN 0.2 04/12/2013 0900   NITRITE Neg 12/14/2013 0940   NITRITE NEGATIVE 04/12/2013 0900   LEUKOCYTESUR large (3+) 12/14/2013 0940   Sepsis Labs Invalid input(s): PROCALCITONIN,  WBC,  LACTICIDVEN Microbiology No results found for this or any previous visit (from the past 240 hour(s)).   SIGNED:   Donne Hazel, MD  [facility] 01/15/2017, 10:55 AM  If 7PM-7AM, please contact night-coverage [email or web address] Password [email or web address]

## 2017-01-16 LAB — HAPTOGLOBIN: Haptoglobin: 121 mg/dL (ref 34–200)

## 2017-01-17 ENCOUNTER — Telehealth: Payer: Self-pay | Admitting: Behavioral Health

## 2017-01-17 LAB — CBC
Hematocrit: 20.6 % — ABNORMAL LOW (ref 34.0–46.6)
Hemoglobin: 7 g/dL — CL (ref 11.1–15.9)
MCH: 35.4 pg — ABNORMAL HIGH (ref 26.6–33.0)
MCHC: 34 g/dL (ref 31.5–35.7)
MCV: 104 fL — ABNORMAL HIGH (ref 79–97)
NRBC: 1 % — ABNORMAL HIGH (ref 0–0)
Platelets: 297 10*3/uL (ref 150–379)
RBC: 1.98 x10E6/uL — CL (ref 3.77–5.28)
RDW: 18.3 % — ABNORMAL HIGH (ref 12.3–15.4)
WBC: 3.8 10*3/uL (ref 3.4–10.8)

## 2017-01-17 LAB — BPAM RBC
Blood Product Expiration Date: 201806052359
Blood Product Expiration Date: 201806052359
ISSUE DATE / TIME: 201805120039
ISSUE DATE / TIME: 201805120324
Unit Type and Rh: 7300
Unit Type and Rh: 7300

## 2017-01-17 LAB — TSH: TSH: 1.9 u[IU]/mL (ref 0.450–4.500)

## 2017-01-17 LAB — TYPE AND SCREEN
ABO/RH(D): B POS
Antibody Screen: NEGATIVE
Unit division: 0
Unit division: 0

## 2017-01-17 LAB — VITAMIN D 25 HYDROXY (VIT D DEFICIENCY, FRACTURES): Vit D, 25-Hydroxy: 31.8 ng/mL (ref 30.0–100.0)

## 2017-01-17 LAB — PATHOLOGIST SMEAR REVIEW

## 2017-01-17 NOTE — Telephone Encounter (Signed)
Transition Care Management Follow-up Telephone Call  PCP: Ann Held, DO  Admit date: 01/14/2017 Discharge date: 01/15/2017  Admitted From: Home Disposition:  Home  Recommendations for Outpatient Follow-up:  1. Follow up with PCP in 1-2 weeks 2. Follow up with GI as already scheduled for 02/14/17 3. Recommend repeat cbc in 1-2 weeks   How have you been since you were released from the hospital? Patient stated, "I'm feeling much better".   Do you understand why you were in the hospital? yes   Do you understand the discharge instructions? yes   Where were you discharged to? Home   Items Reviewed:  Medications reviewed: yes  Allergies reviewed: yes  Dietary changes reviewed: yes, heart healthy diet  Referrals reviewed: yes, Follow up with PCP in 1-2 weeks; Follow up with GI as already scheduled for 02/14/17   Functional Questionnaire:   Activities of Daily Living (ADLs):   She states they are independent in the following: ambulation, bathing and hygiene, feeding, continence, grooming, toileting and dressing States they require assistance with the following: None   Any transportation issues/concerns?: no   Any patient concerns? no   Confirmed importance and date/time of follow-up visits scheduled yes, 01/28/17 at 11:30 AM  Provider Appointment booked with Dr. Carollee Herter.  Confirmed with patient if condition begins to worsen call PCP or go to the ER.  Patient was given the office number and encouraged to call back with question or concerns.  : yes

## 2017-01-19 ENCOUNTER — Other Ambulatory Visit (INDEPENDENT_AMBULATORY_CARE_PROVIDER_SITE_OTHER): Payer: Medicare Other

## 2017-01-19 ENCOUNTER — Other Ambulatory Visit: Payer: Self-pay

## 2017-01-19 ENCOUNTER — Encounter: Payer: Self-pay | Admitting: Gynecology

## 2017-01-19 DIAGNOSIS — D649 Anemia, unspecified: Secondary | ICD-10-CM

## 2017-01-19 LAB — CBC WITH DIFFERENTIAL/PLATELET
Basophils Absolute: 0 10*3/uL (ref 0.0–0.1)
Basophils Relative: 0.6 % (ref 0.0–3.0)
Eosinophils Absolute: 0.2 10*3/uL (ref 0.0–0.7)
Eosinophils Relative: 4.9 % (ref 0.0–5.0)
HCT: 32.3 % — ABNORMAL LOW (ref 36.0–46.0)
Hemoglobin: 11 g/dL — ABNORMAL LOW (ref 12.0–15.0)
Lymphocytes Relative: 53 % — ABNORMAL HIGH (ref 12.0–46.0)
Lymphs Abs: 2.5 10*3/uL (ref 0.7–4.0)
MCHC: 34.2 g/dL (ref 30.0–36.0)
MCV: 97.4 fl (ref 78.0–100.0)
Monocytes Absolute: 0.3 10*3/uL (ref 0.1–1.0)
Monocytes Relative: 5.5 % (ref 3.0–12.0)
Neutro Abs: 1.7 10*3/uL (ref 1.4–7.7)
Neutrophils Relative %: 36 % — ABNORMAL LOW (ref 43.0–77.0)
Platelets: 310 10*3/uL (ref 150.0–400.0)
RBC: 3.31 Mil/uL — ABNORMAL LOW (ref 3.87–5.11)
RDW: 23.3 % — ABNORMAL HIGH (ref 11.5–15.5)
WBC: 4.7 10*3/uL (ref 4.0–10.5)

## 2017-01-21 ENCOUNTER — Telehealth: Payer: Self-pay

## 2017-01-21 NOTE — Telephone Encounter (Signed)
Spoke to Channel at [employer], she sees where they did receive the faxed form for enrollment and benefits. Second time this has been faxed, 1st on 09/09/16. She said that patient is responsible for the 20% co-insurance that goes towards deductible. No prior Josem Kaufmann is required. They have also forwarded this information to department that will see if they can help patient with costs. I have asked that a letter be sent to the patient. Also asked that they send another copy here as I have not received anything.

## 2017-01-24 ENCOUNTER — Telehealth: Payer: Self-pay | Admitting: Behavioral Health

## 2017-01-24 ENCOUNTER — Telehealth: Payer: Self-pay | Admitting: Medical

## 2017-01-24 ENCOUNTER — Ambulatory Visit (INDEPENDENT_AMBULATORY_CARE_PROVIDER_SITE_OTHER): Payer: Medicare Other | Admitting: Medical

## 2017-01-24 ENCOUNTER — Ambulatory Visit (HOSPITAL_BASED_OUTPATIENT_CLINIC_OR_DEPARTMENT_OTHER)
Admission: RE | Admit: 2017-01-24 | Discharge: 2017-01-24 | Disposition: A | Discharge status: Home / Self Care | Payer: Medicare Other | Source: Ambulatory Visit | Attending: Medical | Admitting: Medical

## 2017-01-24 VITALS — BP 127/88 | HR 99 | Temp 97.5°F | Resp 16 | Ht 63.0 in | Wt 181.4 lb

## 2017-01-24 DIAGNOSIS — R944 Abnormal results of kidney function studies: Secondary | ICD-10-CM | POA: Diagnosis not present

## 2017-01-24 DIAGNOSIS — M79601 Pain in right arm: Secondary | ICD-10-CM

## 2017-01-24 DIAGNOSIS — I82611 Acute embolism and thrombosis of superficial veins of right upper extremity: Secondary | ICD-10-CM | POA: Insufficient documentation

## 2017-01-24 DIAGNOSIS — R600 Localized edema: Secondary | ICD-10-CM | POA: Diagnosis not present

## 2017-01-24 DIAGNOSIS — L089 Local infection of the skin and subcutaneous tissue, unspecified: Secondary | ICD-10-CM | POA: Diagnosis not present

## 2017-01-24 LAB — COMPREHENSIVE METABOLIC PANEL
ALT: 17 U/L (ref 0–35)
AST: 19 U/L (ref 0–37)
Albumin: 4.1 g/dL (ref 3.5–5.2)
Alkaline Phosphatase: 62 U/L (ref 39–117)
BUN: 34 mg/dL — ABNORMAL HIGH (ref 6–23)
CO2: 24 mEq/L (ref 19–32)
Calcium: 10.3 mg/dL (ref 8.4–10.5)
Chloride: 104 mEq/L (ref 96–112)
Creatinine, Ser: 2.25 mg/dL — ABNORMAL HIGH (ref 0.40–1.20)
GFR: 23.34 mL/min — ABNORMAL LOW (ref >60.00)
Glucose, Bld: 94 mg/dL (ref 70–99)
Potassium: 3.6 mEq/L (ref 3.5–5.1)
Sodium: 137 mEq/L (ref 135–145)
Total Bilirubin: 0.4 mg/dL (ref 0.2–1.2)
Total Protein: 7.4 g/dL (ref 6.0–8.3)

## 2017-01-24 LAB — CBC WITH DIFFERENTIAL/PLATELET
Basophils Absolute: 0 10*3/uL (ref 0.0–0.1)
Basophils Relative: 0.6 % (ref 0.0–3.0)
Eosinophils Absolute: 0.3 10*3/uL (ref 0.0–0.7)
Eosinophils Relative: 6 % — ABNORMAL HIGH (ref 0.0–5.0)
HCT: 29.6 % — ABNORMAL LOW (ref 36.0–46.0)
Hemoglobin: 10 g/dL — ABNORMAL LOW (ref 12.0–15.0)
Lymphocytes Relative: 48.1 % — ABNORMAL HIGH (ref 12.0–46.0)
Lymphs Abs: 2.1 10*3/uL (ref 0.7–4.0)
MCHC: 33.8 g/dL (ref 30.0–36.0)
MCV: 99.6 fl (ref 78.0–100.0)
Monocytes Absolute: 0.4 10*3/uL (ref 0.1–1.0)
Monocytes Relative: 9.8 % (ref 3.0–12.0)
Neutro Abs: 1.5 10*3/uL (ref 1.4–7.7)
Neutrophils Relative %: 35.5 % — ABNORMAL LOW (ref 43.0–77.0)
Platelets: 411 10*3/uL — ABNORMAL HIGH (ref 150.0–400.0)
RBC: 2.97 Mil/uL — ABNORMAL LOW (ref 3.87–5.11)
RDW: 24.4 % — ABNORMAL HIGH (ref 11.5–15.5)
WBC: 4.3 10*3/uL (ref 4.0–10.5)

## 2017-01-24 MED ORDER — DOXYCYCLINE HYCLATE 100 MG PO TABS: 100.0000 mg | ORAL_TABLET | Freq: Two times a day (BID) | ORAL | 0 refills | Status: DC

## 2017-01-24 NOTE — Telephone Encounter (Signed)
See result note. Discussed with radiologist and notified pt.

## 2017-01-24 NOTE — Patient Instructions (Addendum)
For probable skin infection rx doxycycline antibiotic.  For possible superficial phlebitis vs dvt will get rt upper ext ultasound.(assess treatment needs after following stat report) [place] today at 1:30.  Will get cbc and cmp today.  Follow up on Thursday or as needed

## 2017-01-24 NOTE — Telephone Encounter (Signed)
Caller: Colletta Maryland, [facility]  Reason for call: Results   Per Colletta Maryland, the ultrasound was positive for clots in bacilic; bicep to elbow & superficial clot in forearm.   Informed Percell Miller, [place], PA-C of the above results.

## 2017-01-24 NOTE — Progress Notes (Signed)
Subjective:    Patient ID: [name], female    DOB: Jan 07, 1956, 61 y.o.   MRN: [id number]  HPI  Pt in states she has rt forearm and rt arm pain since lat seek. She was given blood for transfusion on 01-14-2017. Pt states hs of crohns colitis and some intermittent bleeding with anemia in past.  Got transfusion for anemia. She states after transfusion pain in her rt  arm started. Pain was medial bicep area down to hand. Now pain more in rt forearm and she has swelling of forearm. Pt points to and describes superficial vein is enflamed.  No fever, no chills or sweats. Her arm fells little warm.   Review of Systems  Constitutional: Negative for chills and fatigue.  Respiratory: Negative for cough, chest tightness and shortness of breath.   Cardiovascular: Negative for palpitations.  Musculoskeletal: Negative for back pain.       Rt forearm pain and swollen superifical veins per pt.  Skin: Negative for rash.       Slight rash pinkish red skin over forearm.  Hematological: Negative for adenopathy. Does not bruise/bleed easily.  Psychiatric/Behavioral: Negative for behavioral problems and confusion.   Past Medical History:  Diagnosis Date  . Abdominal pain    Hx  . Anal stenosis   . Anemia   . Anxiety   . Asthma    patient does not have inhaler  . Blood in stool    Hx  . Blood in urine    Hx  . De Quervain's tenosynovitis   . Depression   . Difficulty urinating    Hx  . Easy bruising   . Esophagitis   . Fainting    History - resolved - due to dehydration  . Fatigue    Hx  . Fibroid    Hx  . Gastric polyp   . GERD (gastroesophageal reflux disease)   . 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 Hemorrhoids, external   . Hemorrhoids, internal   . Herpes, genital    vaginal treated 07/05/12 and pt states is resolved  . History of cervical dysplasia   . History of small bowel obstruction   . Hyperlipidemia    currently no meds  .  Hyperparathyroidism   . Hypertension   . Hypokalemia    Hx  . 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 Obesity   . Ovarian cyst   . Poor dental hygiene   . Pulmonary nodule, right    right upper lobe  . Rectal bleeding    Hx  . Rectal pain    Hx  . Renal insufficiency    CKD - stage 2  . RLS (restless legs syndrome)    no meds  . Tooth infection 11/2016   right low  . Ulcerative colitis   . Visual disturbance    wears glasses  . Weakness generalized    Hx - patient denies generalized weakness  . Wears dentures    upper only     Social History   Social History  . Marital status: Divorced    Spouse name: N/A  . Number of children: N/A  . Years of education: N/A   Occupational History  . Not on file.   Social History Main Topics  . Smoking status: Former Smoker    Packs/day: 1.00  Years: 4.00    Types: Cigarettes    Quit date: 05/21/1979  . Smokeless tobacco: Never Used  . Alcohol use No  . Drug use: No  . Sexual activity: Yes    Birth control/ protection: Post-menopausal   Other Topics Concern  . Not on file   Social History Narrative  . No narrative on file    Past Surgical History:  Procedure Laterality Date  . ANAL DILATION    . CERVICAL BIOPSY  W/ LOOP ELECTRODE EXCISION    . CHOLECYSTECTOMY    . COLONOSCOPY     [name]  . fatty tumor removed from back     X 2  . HEMORRHOID SURGERY    . ILEOSTOMY CLOSURE    . RESTORATIVE PROCTOCOLECTOMY     with insertion of ileoanal J Pouch with loop ileostomy  . TOTAL ABDOMINAL HYSTERECTOMY  1998   TAH/LSO  . TUBAL LIGATION    . UPPER GASTROINTESTINAL ENDOSCOPY     [name]    Family History  Problem Relation Age of Onset  . Ulcerative colitis Father   . Hypertension Father   . Heart attack Father   . Hypertension Mother   . Heart disease Mother        s/p pci  . Ulcerative colitis Daughter   . Irritable  bowel syndrome Unknown        grandchildren  . Diabetes Sister   . Cancer Sister        uterine  . Cancer Maternal Uncle        LUNG  . Colon cancer Neg Hx   . Esophageal cancer Neg Hx   . Stomach cancer Neg Hx   . Rectal cancer Neg Hx     Allergies  Allergen Reactions  . Morphine Other (See Comments)    Gives me crazy dreams  . Sulfonamide Derivatives Itching, Swelling and Rash    Current Outpatient Prescriptions on File Prior to Visit  Medication Sig Dispense Refill  . BIOTIN PO Take by mouth daily.    . calcitRIOL (ROCALTROL) 0.25 MCG capsule Take 0.25 mcg by mouth every other day. Mon, Wed, Fri    . DULoxetine (CYMBALTA) 60 MG capsule Take 2 capsules (120 mg total) by mouth daily. 180 capsule 0  . HYDROcodone-acetaminophen (NORCO/VICODIN) 5-325 MG tablet Take 2 tablets by mouth every 4 (four) hours as needed for moderate pain or severe pain. 90 tablet 0  . Melatonin 2.5 MG CAPS Take 2.5 mg by mouth at bedtime.     . mercaptopurine (PURINETHOL) 50 MG tablet TAKE 1 TABLET (50 MG TOTAL) BY MOUTH DAILY. 90 tablet 0  . metoprolol succinate (TOPROL-XL) 50 MG 24 hr tablet Take 1 tablet (50 mg total) by mouth daily. 90 tablet 3  . Multiple Vitamins-Minerals (MULTIVITAL) CHEW Chew 1 tablet by mouth daily. Reported on 03/23/2016    . ranitidine (ZANTAC) 150 MG tablet TAKE ONE TABLET BY MOUTH TWICE DAILY (Patient taking differently: TAKE 150MG  BY MOUTH AT BEDTIME) 180 tablet 0  . vedolizumab (ENTYVIO) 300 MG injection Inject 300 mg into the vein as directed. EVERY 8 WEEKS     No current facility-administered medications on file prior to visit.     BP 127/88 (BP Location: Left Arm, Patient Position: Sitting, Cuff Size: Normal)   Pulse 99   Temp 97.5 F (36.4 C) (Oral)   Resp 16   Ht 5\' 3"  (1.6 m)   Wt 181 lb 6.4 oz (82.3 kg)   SpO2 100%  BMI 32.13 kg/m       Objective:   Physical Exam  General Mental Status- Alert. General Appearance- Not in acute distress.   Skin Rt  side forearm- moderate swollen and mild tedner. On palpation the superficial vessels feel hard.   Rt upper arm- no swollen.   Chest and Lung Exam Auscultation: Breath Sounds:-Normal.  Cardiovascular Auscultation:Rythm- Regular. Murmurs & Other Heart Sounds:Auscultation of the heart reveals- No Murmurs.  .  Neurologic Cranial Nerve exam:- CN III-XII intact(No nystagmus), symmetric smile. Strength:- 5/5 equal and symmetric strength both upper and lower extremities.      Assessment & Plan:  For probable skin infection rx doxycycline antibiotic.  For possible superficial phlebitis vs dvt will get rt upper ext ultasound.(assess treatment needs after following stat report)  Will get cbc and cmp today.  Follow up on Thursday or as needed  Hasset Chaviano, [place], [place]

## 2017-01-26 ENCOUNTER — Telehealth: Payer: Self-pay | Admitting: *Deleted

## 2017-01-26 ENCOUNTER — Telehealth: Payer: Self-pay | Admitting: Physician Assistant

## 2017-01-26 NOTE — Telephone Encounter (Signed)
LM for patient to return call to schedule AWV.   

## 2017-01-26 NOTE — Telephone Encounter (Signed)
I called pt to let her know per Nicki Reaper W.PA she will not need to have the echo done that was scheduled for tomorrow 01/27/17. I cancelled echo appt per provider. Pt wanted to make sure she is not going to be charged the $50 cancellation fee. I assured pt that she will not incur that cancellation fee due to echo cancelled per provider request. Pt thanked me for my call tonight.

## 2017-01-26 NOTE — Telephone Encounter (Signed)
I will forward to Richardson Dopp, [place] for further advice as to pt's question if she really still needs echo. Looks like echo was being scheduled due to pt c/o chest pain, sob. Pt states in her phone note from today that her Hgb was 6.9 and her PCP ordered a blood transfusion and states she feels much better since transfusion. I believe PA will still want to get echo for further evaluation though I will check with PA before calling the pt.

## 2017-01-26 NOTE — Telephone Encounter (Signed)
New message       Pt is due to have an echo tomorrow.  Her hgb was 6.9 and her PCP ordered a blood transfusion.  Pt states that she feels much better and wonders if she still need to have the echo tomorrow.  Please call

## 2017-01-26 NOTE — Telephone Encounter (Signed)
Ok to cancel echocardiogram. Richardson Dopp, PA-C    01/26/2017 5:04 PM

## 2017-01-27 ENCOUNTER — Other Ambulatory Visit (HOSPITAL_COMMUNITY): Payer: Medicare Other

## 2017-01-28 ENCOUNTER — Inpatient Hospital Stay: Payer: Medicare Other | Admitting: Family Medicine

## 2017-01-28 ENCOUNTER — Ambulatory Visit (HOSPITAL_BASED_OUTPATIENT_CLINIC_OR_DEPARTMENT_OTHER): Admission: RE | Admit: 2017-01-28 | Payer: Medicare Other | Source: Ambulatory Visit

## 2017-01-28 ENCOUNTER — Encounter: Payer: Self-pay | Admitting: Medical

## 2017-01-28 ENCOUNTER — Ambulatory Visit (INDEPENDENT_AMBULATORY_CARE_PROVIDER_SITE_OTHER): Payer: Medicare Other | Admitting: Medical

## 2017-01-28 VITALS — BP 126/80 | HR 90 | Temp 98.1°F | Ht 63.0 in | Wt 180.4 lb

## 2017-01-28 DIAGNOSIS — D649 Anemia, unspecified: Secondary | ICD-10-CM

## 2017-01-28 DIAGNOSIS — M79601 Pain in right arm: Secondary | ICD-10-CM | POA: Diagnosis not present

## 2017-01-28 DIAGNOSIS — L089 Local infection of the skin and subcutaneous tissue, unspecified: Secondary | ICD-10-CM | POA: Diagnosis not present

## 2017-01-28 DIAGNOSIS — K50919 Crohn's disease, unspecified, with unspecified complications: Secondary | ICD-10-CM | POA: Diagnosis not present

## 2017-01-28 DIAGNOSIS — R5383 Other fatigue: Secondary | ICD-10-CM | POA: Diagnosis not present

## 2017-01-28 DIAGNOSIS — I809 Phlebitis and thrombophlebitis of unspecified site: Secondary | ICD-10-CM | POA: Diagnosis not present

## 2017-01-28 LAB — COMPREHENSIVE METABOLIC PANEL
ALT: 14 U/L (ref 0–35)
AST: 18 U/L (ref 0–37)
Albumin: 4.1 g/dL (ref 3.5–5.2)
Alkaline Phosphatase: 54 U/L (ref 39–117)
BUN: 40 mg/dL — ABNORMAL HIGH (ref 6–23)
CO2: 24 mEq/L (ref 19–32)
Calcium: 9.6 mg/dL (ref 8.4–10.5)
Chloride: 105 mEq/L (ref 96–112)
Creatinine, Ser: 2.42 mg/dL — ABNORMAL HIGH (ref 0.40–1.20)
GFR: 21.46 mL/min — ABNORMAL LOW (ref >60.00)
Glucose, Bld: 98 mg/dL (ref 70–99)
Potassium: 3.5 mEq/L (ref 3.5–5.1)
Sodium: 138 mEq/L (ref 135–145)
Total Bilirubin: 0.4 mg/dL (ref 0.2–1.2)
Total Protein: 7.4 g/dL (ref 6.0–8.3)

## 2017-01-28 LAB — CBC WITH DIFFERENTIAL/PLATELET
Basophils Absolute: 0 10*3/uL (ref 0.0–0.1)
Basophils Relative: 0.6 % (ref 0.0–3.0)
Eosinophils Absolute: 0.2 10*3/uL (ref 0.0–0.7)
Eosinophils Relative: 4 % (ref 0.0–5.0)
HCT: 29.8 % — ABNORMAL LOW (ref 36.0–46.0)
Hemoglobin: 10.1 g/dL — ABNORMAL LOW (ref 12.0–15.0)
Lymphocytes Relative: 40.8 % (ref 12.0–46.0)
Lymphs Abs: 2.2 10*3/uL (ref 0.7–4.0)
MCHC: 34.1 g/dL (ref 30.0–36.0)
MCV: 98.7 fl (ref 78.0–100.0)
Monocytes Absolute: 0.8 10*3/uL (ref 0.1–1.0)
Monocytes Relative: 14.8 % — ABNORMAL HIGH (ref 3.0–12.0)
Neutro Abs: 2.2 10*3/uL (ref 1.4–7.7)
Neutrophils Relative %: 39.8 % — ABNORMAL LOW (ref 43.0–77.0)
Platelets: 467 10*3/uL — ABNORMAL HIGH (ref 150.0–400.0)
RBC: 3.01 Mil/uL — ABNORMAL LOW (ref 3.87–5.11)
RDW: 24.2 % — ABNORMAL HIGH (ref 11.5–15.5)
WBC: 5.4 10*3/uL (ref 4.0–10.5)

## 2017-01-28 NOTE — Progress Notes (Signed)
Subjective:    Patient ID: [name], female    DOB: August 31, 1956, 61 y.o.   MRN: [id number]  HPI  Pt in for follow up on her superficial phlebitis.  Pt states her arm is not more painful. She feels about the same. Still has some faint redness to her arm. Last US showed only superficial thrombosis.   Pt has history of anemia and hx of some blood in stools. Recent transfusion when admitted. Pt has no abdomen pain. No black stools reported.  On discharge note read.  2. Crohn's disease: -Held mercaptopurine on admit -Discussed case with GI on call. Suspicion for chronic blood loss or effect of 6MP -GI recommendations to keep f/u appointment with primary GI on 02/14/17 - Pt to continue current regimen for now until re-evaluated by GI  Pt feels little fatigued today.  I wanted to see her back today to make sure he superficial thrombosis not extending into deep system.   Review of Systems  Constitutional: Positive for fatigue. Negative for chills and fever.  Respiratory: Negative for cough, chest tightness, shortness of breath and wheezing.   Cardiovascular: Negative for chest pain and palpitations.  Gastrointestinal: Negative for abdominal pain, blood in stool, constipation, diarrhea, rectal pain and vomiting.  Musculoskeletal: Negative for back pain.       Rt arm pain. See hpi and exam  Skin: Positive for rash.       See hpi and physical.  Neurological: Negative for dizziness and headaches.  Hematological: Negative for adenopathy. Does not bruise/bleed easily.  Psychiatric/Behavioral: Negative for behavioral problems and confusion.   Past Medical History:  Diagnosis Date  . Abdominal pain    Hx  . Anal stenosis   . Anemia   . Anxiety   . Asthma    patient does not have inhaler  . Blood in stool    Hx  . Blood in urine    Hx  . De Quervain's tenosynovitis   . Depression   . Difficulty urinating    Hx  . Easy bruising   . Esophagitis   . Fainting    History -  resolved - due to dehydration  . Fatigue    Hx  . Fibroid    Hx  . Gastric polyp   . GERD (gastroesophageal reflux disease)   . 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 Hemorrhoids, external   . Hemorrhoids, internal   . Herpes, genital    vaginal treated 07/05/12 and pt states is resolved  . History of cervical dysplasia   . History of small bowel obstruction   . Hyperlipidemia    currently no meds  . Hyperparathyroidism   . Hypertension   . Hypokalemia    Hx  . 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 Obesity   . Ovarian cyst   . Poor dental hygiene   . Pulmonary nodule, [name]    [name] upper lobe  . Rectal bleeding    Hx  . Rectal pain    Hx  . Renal insufficiency    CKD - stage 2  . RLS (restless legs syndrome)    no meds  . Tooth infection 11/2016   [name] low  . Ulcerative colitis   . Visual disturbance    wears glasses  . Weakness generalized    Hx - patient  denies generalized weakness  . Wears dentures    upper only     Social History   Social History  . Marital status: Divorced    Spouse name: N/A  . Number of children: N/A  . Years of education: N/A   Occupational History  . Not on file.   Social History Main Topics  . Smoking status: Former Smoker    Packs/day: 1.00    Years: 4.00    Types: Cigarettes    Quit date: 05/21/1979  . Smokeless tobacco: Never Used  . Alcohol use No  . Drug use: No  . Sexual activity: Yes    Birth control/ protection: Post-menopausal   Other Topics Concern  . Not on file   Social History Narrative  . No narrative on file    Past Surgical History:  Procedure Laterality Date  . ANAL DILATION    . CERVICAL BIOPSY  W/ LOOP ELECTRODE EXCISION    . CHOLECYSTECTOMY    . COLONOSCOPY     [name]  . fatty tumor removed from back     X 2  . HEMORRHOID SURGERY    . ILEOSTOMY CLOSURE    . RESTORATIVE  PROCTOCOLECTOMY     with insertion of ileoanal J Pouch with loop ileostomy  . TOTAL ABDOMINAL HYSTERECTOMY  1998   TAH/LSO  . TUBAL LIGATION    . UPPER GASTROINTESTINAL ENDOSCOPY     [name]    Family History  Problem Relation Age of Onset  . Ulcerative colitis Father   . Hypertension Father   . Heart attack Father   . Hypertension Mother   . Heart disease Mother        s/p pci  . Ulcerative colitis Daughter   . Irritable bowel syndrome Unknown        grandchildren  . Diabetes Sister   . Cancer Sister        uterine  . Cancer Maternal Uncle        LUNG  . Colon cancer Neg Hx   . Esophageal cancer Neg Hx   . Stomach cancer Neg Hx   . Rectal cancer Neg Hx     Allergies  Allergen Reactions  . Morphine Other (See Comments)    Gives me crazy dreams  . Sulfonamide Derivatives Itching, Swelling and Rash    Current Outpatient Prescriptions on File Prior to Visit  Medication Sig Dispense Refill  . BIOTIN PO Take by mouth daily.    . calcitRIOL (ROCALTROL) 0.25 MCG capsule Take 0.25 mcg by mouth every other day. Mon, Wed, Fri    . doxycycline (VIBRA-TABS) 100 MG tablet Take 1 tablet (100 mg total) by mouth 2 (two) times daily. 14 tablet 0  . DULoxetine (CYMBALTA) 60 MG capsule Take 2 capsules (120 mg total) by mouth daily. 180 capsule 0  . HYDROcodone-acetaminophen (NORCO/VICODIN) 5-325 MG tablet Take 2 tablets by mouth every 4 (four) hours as needed for moderate pain or severe pain. 90 tablet 0  . Melatonin 2.5 MG CAPS Take 2.5 mg by mouth at bedtime.     . mercaptopurine (PURINETHOL) 50 MG tablet TAKE 1 TABLET (50 MG TOTAL) BY MOUTH DAILY. 90 tablet 0  . metoprolol succinate (TOPROL-XL) 50 MG 24 hr tablet Take 1 tablet (50 mg total) by mouth daily. 90 tablet 3  . Multiple Vitamins-Minerals (MULTIVITAL) CHEW Chew 1 tablet by mouth daily. Reported on 03/23/2016    . ranitidine (ZANTAC) 150 MG tablet TAKE ONE TABLET BY MOUTH TWICE DAILY (  Patient taking differently: TAKE 150MG  BY  MOUTH AT BEDTIME) 180 tablet 0  . vedolizumab (ENTYVIO) 300 MG injection Inject 300 mg into the vein as directed. EVERY 8 WEEKS     No current facility-administered medications on file prior to visit.     BP 126/80 (BP Location: Left Arm, Patient Position: Sitting, Cuff Size: Normal)   Pulse 90   Temp 98.1 F (36.7 C) (Oral)   Ht 5\' 3"  (1.6 m)   Wt 180 lb 6.4 oz (81.8 kg)   SpO2 94%   BMI 31.96 kg/m       Objective:   Physical Exam  General Mental Status- Alert. General Appearance- Not in acute distress.   Skin Rt side forearm- moderate swollen and mild tedner(faint red rash). On palpation the superficial vessels still hard. 2 in antecubital region. One medial elbow and one mid forearm.   Rt upper arm- not swollen.   Chest and Lung Exam Auscultation: Breath Sounds:-Normal.  Cardiovascular Auscultation:Rythm- Regular. Murmurs & Other Heart Sounds:Auscultation of the heart reveals- No Murmurs.  .Abd-soft, nt, nd, +bs. No rebound.  Neurologic Cranial Nerve exam:- CN III-XII intact(No nystagmus), symmetric smile. Strength:- 5/5 equal and symmetric strength both upper and lower extremities.      Assessment & Plan:  For skin infection continue doxycycline.  Continue warm compresses twice daily. Can use tylenol.  Will repeat US to see if extension to the deep vein system.([place] at 2:30)  Repeat cbc and cmp.   Plan to discuss with pt pcp today after results come in.  Follow up 4 days or as needed  Chonda Baney, Percell Miller, [employer]

## 2017-01-28 NOTE — Patient Instructions (Addendum)
For skin infection continue doxycycline.  Continue warm compresses twice daily. Can use tylenol.  Will repeat US to see if extension to the deep vein system.(test at 2:30)  Repeat cbc and cmp.   Plan to discuss with pt pcp today after results come in.  Follow up 4 days or as needed

## 2017-01-29 ENCOUNTER — Ambulatory Visit (HOSPITAL_BASED_OUTPATIENT_CLINIC_OR_DEPARTMENT_OTHER)
Admission: RE | Admit: 2017-01-29 | Discharge: 2017-01-29 | Disposition: A | Discharge status: Home / Self Care | Payer: Medicare Other | Source: Ambulatory Visit | Attending: Medical | Admitting: Medical

## 2017-01-29 DIAGNOSIS — I809 Phlebitis and thrombophlebitis of unspecified site: Secondary | ICD-10-CM | POA: Diagnosis not present

## 2017-01-29 DIAGNOSIS — M79621 Pain in right upper arm: Secondary | ICD-10-CM | POA: Diagnosis not present

## 2017-01-29 DIAGNOSIS — I82611 Acute embolism and thrombosis of superficial veins of right upper extremity: Secondary | ICD-10-CM | POA: Diagnosis not present

## 2017-01-29 DIAGNOSIS — M7989 Other specified soft tissue disorders: Secondary | ICD-10-CM | POA: Diagnosis not present

## 2017-01-29 DIAGNOSIS — M79601 Pain in right arm: Secondary | ICD-10-CM

## 2017-01-30 ENCOUNTER — Telehealth: Payer: Self-pay | Admitting: Medical

## 2017-01-30 NOTE — Telephone Encounter (Signed)
Open to review.  

## 2017-01-30 NOTE — Telephone Encounter (Signed)
Dr. Etter Sjogren,  I have recently seen [facility] twice on 01-24-2017 and another time on 01-28-2017. I saw her initially for follow up on hospitalization for anemia and need for transfusion(admitted 01-14-2017 and discharge 01-15-2017). Source on record review was thought to be GI bleed. Pt has inflammatory bowel disease. Colonoscopy  and sigmoidoscopy reported UC finding in 2016. Most recent hospital DC mentioned crohns. She had been having hematochezia and FOBT+ at time of admission. Hb time of admission showed hb 6.9.  She was tansfused 2 units of packed red blood cells.   On follow up with me both visits her hb and hct(10.1/29.6). Repeat hb 10.1 and hct 29.8.  However on 01-24-2017 She had sore rt forearm.(mild tender). Please that note. I ordered US that day. Showed superlficial phlebitis/thrombosis cephalic and basilic vein. One area was red on forearm .Rx'd antibiotic and advised warm compresses. Could not recommend nsaids due to her ulcerative colitis history and her renal insufficiency.   I saw her on Friday as I had plans to recheck area. I wanted to follow rt upper extremity to make sure no extension into the deep system. I talked with radiologist on 01-24-2017 and he told me on superficial thrombosis at that time.  The repeat study I had order on 01-28-2017. Was scheduled stat at 2:30. I had hopes would get results before holiday weekend. I got word she change study to 01-29-2017 due to transportation issues. So I call call results on Saturday. 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  Reviewed the actual written report today on 01-30-2017.  I wanted you to be aware of this as in past I had talked with hematologist regarding other patients  who had similar finding in superficial vein and was right at deep vein system. In those scenarios I was advised to start xarelto. But those  patients did not have inflammatory bowel, disease,recent anemia and possible recent bleed.  I wanted to get your advise. I was thinking of reaching out to Dr. Marin Olp on Tuesday or Maybe trying to get his opinion tomorrow. In addition I thought of informing her GI MD. She has appointment on 02-14-2017. Maybe that could be moved. They might do early scope/ Assess her colon and give [place] opinion on using xarelto this week.  I am sending this and will try to find you cell number to ask you to review my notes and the [place] reports. You could just text me your advise or call my cell. Otherwise will see you on Tuesday. Hope you having nice holiday.  Also last I talked with patient she was doing well on saturday. No change in her symptoms. No report of any increased  arm pain or any  abdomen pain. I advised her would up date her on Tuesday.  Percell Miller

## 2017-01-31 ENCOUNTER — Telehealth: Payer: Self-pay | Admitting: Medical

## 2017-01-31 DIAGNOSIS — I808 Phlebitis and thrombophlebitis of other sites: Secondary | ICD-10-CM

## 2017-01-31 DIAGNOSIS — K50119 Crohn's disease of large intestine with unspecified complications: Secondary | ICD-10-CM

## 2017-01-31 NOTE — Telephone Encounter (Signed)
I put in referrals to Dr. Marin Olp and her GI MD. I will try to talk with both tomorrow morning. Will try to speak with Dr. Marin Olp first. Tried to get in touch with him today by calling cancer center thinking maybe they had treatments scheduled but no answer. Tried to call pt as well and touch base with her but no answer.  Will talk with Anderson Malta and Steffanie Dunn in am to ask them prioritize hematologist referral.

## 2017-01-31 NOTE — Telephone Encounter (Signed)
Will you look at both patient referral to hematologist and Gastroenterologist. Please prioritize these but also speak with me before calls made.

## 2017-01-31 NOTE — Telephone Encounter (Signed)
I would have her see hematology--- definitely talk to dr Marin Olp Let GI know as well

## 2017-01-31 NOTE — Telephone Encounter (Signed)
Tried to call pt and no answer. Left message stating giving her update that I have informed Dr. Etter Sjogren and trying to talk with specialist. I left my cell phone number for her to call me back.

## 2017-01-31 NOTE — Telephone Encounter (Addendum)
Tried to call [place]/hematology today hoping  to talk with Dr. Toney Sang discuss pt rt upper ext thrombosis). Hoping that his office might have limited hours but no answer when I called.

## 2017-01-31 NOTE — Telephone Encounter (Signed)
Pt called me back around 5 pm. She states she was feeling fine without any problems.I updated her that I had notified Dr. Etter Sjogren and made referral to hematologist and GI. Explained will try to discuss her recent condition with Dr. Marin Olp and then notify her GI MD how we will approach treatment of her thrombosed vein now very close to her axillary vein in light of her GI history and recent severe anemia. I did advise pt if she were to get constant achy shoulder, axillar area pain,  or shortness of breath then be seen in ED. Pt agreed and  expressed understanding.

## 2017-02-01 ENCOUNTER — Telehealth: Payer: Self-pay | Admitting: Medical

## 2017-02-01 ENCOUNTER — Encounter: Payer: Self-pay | Admitting: Medical

## 2017-02-01 ENCOUNTER — Encounter: Payer: Self-pay | Admitting: Gastroenterology

## 2017-02-01 MED ORDER — RIVAROXABAN 20 MG PO TABS: 20.0000 mg | ORAL_TABLET | Freq: Every day | ORAL | 3 refills | Status: DC

## 2017-02-01 MED ORDER — RIVAROXABAN 20 MG PO TABS: 20.0000 mg | ORAL_TABLET | Freq: Every day | ORAL | 0 refills | Status: DC

## 2017-02-01 NOTE — Telephone Encounter (Signed)
Dr. Havery Moros,  I wanted to update you on [name] as I believe you will see her on June 8 or June 11th regarding her crohns disease and follow up hospitalization. She had 2 units prbc transfused and thought was probably GI source. Her hb/hct area now stable hb 10 range and hct 29 range.  Since discharge on May 11 th she had superficial phlebitis of rt upper ext. Initially in cephalic and basilic. On repeat US thrombosis reached just at level of axillary vein but axillary patent. I talked with hematologist Dr. Marin Olp and we have started her on xarelto. I wanted to make you aware of this as you will seeing her and this may effect your work up or test that you had planned for her. If you have any questions please let me know.  Thanks, Architect, Percell Miller, PA-C

## 2017-02-01 NOTE — Telephone Encounter (Signed)
Ok great.

## 2017-02-01 NOTE — Telephone Encounter (Signed)
I talked with Dr. 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 He recommended her starting xarelto 20 mg q day. Benefit exceeds risk. Have pt pick up first 2 wks sample. Sent rx to her local pharmacy as well.

## 2017-02-01 NOTE — Telephone Encounter (Signed)
Notified pt. Pt states she will be here to pick up samples.

## 2017-02-01 NOTE — Telephone Encounter (Signed)
Pt picked up samples at front office.

## 2017-02-02 ENCOUNTER — Telehealth: Payer: Self-pay

## 2017-02-02 NOTE — Telephone Encounter (Signed)
Patient is eligible to receive [place] Co-pay assistance per letter received 02/01/17. Spoke to patient and she did get copy of same letter, she will call the phone number listed on the letter to find out about the program.

## 2017-02-02 NOTE — Telephone Encounter (Signed)
Thanks very much for your note and follow up, I appreciate it. Sorry to hear about her thrombosis.  I will see her in clinic and discuss her GI care, we had hoped to repeat a flex sig to assess how active her Crohn's was in relation to her anemia.  I will have to speak with Dr. Marin Olp about the timing of that in light of her thrombosis. Thanks

## 2017-02-07 ENCOUNTER — Encounter: Payer: Medicare Other | Admitting: Gastroenterology

## 2017-02-11 ENCOUNTER — Encounter: Payer: Self-pay | Admitting: Gastroenterology

## 2017-02-11 ENCOUNTER — Ambulatory Visit: Payer: Medicare Other | Admitting: Physician Assistant

## 2017-02-11 ENCOUNTER — Ambulatory Visit (INDEPENDENT_AMBULATORY_CARE_PROVIDER_SITE_OTHER): Payer: Medicare Other | Admitting: Gastroenterology

## 2017-02-11 VITALS — BP 130/78 | HR 96 | Ht 63.25 in | Wt 182.4 lb

## 2017-02-11 DIAGNOSIS — D649 Anemia, unspecified: Secondary | ICD-10-CM | POA: Diagnosis not present

## 2017-02-11 DIAGNOSIS — K50018 Crohn's disease of small intestine with other complication: Secondary | ICD-10-CM

## 2017-02-11 DIAGNOSIS — K91850 Pouchitis: Secondary | ICD-10-CM | POA: Diagnosis not present

## 2017-02-11 NOTE — Progress Notes (Signed)
HPI :  IBD history: 61 year old white female with IBD (? ulcerative colitis vs Crohn's) since 1990. 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 She reports she was never able to be put in remission prior to colectomy. She reports only being exposed to steroids prior to her surgery. Post-operatively, she was on Remicade for 3-4 years but she is not sure if it helped too much. She has been on 6MP remotely but did not work for her. She was on Humira but also did not put her into remission and she developed high antibody titers and it was stopped. Most recently on Entyvio and low dose 6MP. She reports multiple courses of antibiotics with cipro and flagyl with some benefit historically. She has been on budesonider with ? Relief. Father and daughter have UC.   SINCE THE LAST VISIT:  The patient had been doing pretty well in regards to her pouchitis / Crohn's symptoms. She has been maintained on Entyvio and 6MP. Unfortunately the patient was admitted for symptomatic anemia in early May. She had a Hgb of 6.9 with an MCV of 104. Stool for OB was positive. Iron levels were normal. She received 2 units PRBC. Hgb rose appropriately. 6MP was stopped due to anemia in case this was related. Hgb has remained around 10 for the past month, stable.  She has been seeing some blood in the stool rare. She has some periodic lower abdominal cramping - she feels well after the Entyvio infusion, and states the symptoms come back about week prior to the next infusion. She has periodic fecal leakage, but not like she had before which was severe. She is has variable stool frequency. She definitely feels improved on Entyvio compared to prior to it and thinks it has helped her significantly.   She has been on Xarelto for thrombosis in her upper extremity, diagnosed a few weeks ago. RUE, awaiting Hematology visit to determine how long she needs to be on this. It is the first DVT she's ever had.      Past Medical History:  Diagnosis Date  . Abdominal pain    Hx  . Anal stenosis   . Anemia   . Anxiety   . Asthma    patient does not have inhaler  . Blood in stool    Hx  . Blood in urine    Hx  . De Quervain's tenosynovitis   . Depression   . Difficulty urinating    Hx  . Easy bruising   . Esophagitis   . Fainting    History - resolved - due to dehydration  . Fatigue    Hx  . Fibroid    Hx  . Gastric polyp   . GERD (gastroesophageal reflux disease)   . 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 Hemorrhoids, external   . Hemorrhoids, internal   . Herpes, genital    vaginal treated 07/05/12 and pt states is resolved  . History of cervical dysplasia   . History of small bowel obstruction   . Hyperlipidemia    currently no meds  . Hyperparathyroidism   . Hypertension   . Hypokalemia    Hx  . 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 Obesity   . Ovarian cyst   . Poor dental hygiene   . Pulmonary  nodule, right    right upper lobe  . Rectal bleeding    Hx  . Rectal pain    Hx  . Renal insufficiency    CKD - stage 2  . RLS (restless legs syndrome)    no meds  . Tooth infection 11/2016   right low  . Ulcerative colitis   . Visual disturbance    wears glasses  . Weakness generalized    Hx - patient denies generalized weakness  . Wears dentures    upper only     Past Surgical History:  Procedure Laterality Date  . ANAL DILATION    . CERVICAL BIOPSY  W/ LOOP ELECTRODE EXCISION    . CHOLECYSTECTOMY    . COLONOSCOPY     [name]  . fatty tumor removed from back     X 2  . HEMORRHOID SURGERY    . ILEOSTOMY CLOSURE    . RESTORATIVE PROCTOCOLECTOMY     with insertion of ileoanal J Pouch with loop ileostomy  . TOTAL ABDOMINAL HYSTERECTOMY  1998   TAH/LSO  . TUBAL LIGATION    . UPPER GASTROINTESTINAL ENDOSCOPY     [name]   Family History   Problem Relation Age of Onset  . Ulcerative colitis Father   . Hypertension Father   . Heart attack Father   . Hypertension Mother   . Heart disease Mother        s/p pci  . Ulcerative colitis Daughter   . Irritable bowel syndrome Unknown        grandchildren  . Diabetes Sister   . Cancer Sister        uterine  . Cancer Maternal Uncle        LUNG  . Colon cancer Neg Hx   . Esophageal cancer Neg Hx   . Stomach cancer Neg Hx   . Rectal cancer Neg Hx    Social History  Substance Use Topics  . Smoking status: Former Smoker    Packs/day: 1.00    Years: 4.00    Types: Cigarettes    Quit date: 05/21/1979  . Smokeless tobacco: Never Used  . Alcohol use No   Current Outpatient Prescriptions  Medication Sig Dispense Refill  . BIOTIN PO Take by mouth daily.    . calcitRIOL (ROCALTROL) 0.25 MCG capsule Take 0.25 mcg by mouth every other day. Mon, Wed, Fri    . DULoxetine (CYMBALTA) 60 MG capsule Take 2 capsules (120 mg total) by mouth daily. 180 capsule 0  . HYDROcodone-acetaminophen (NORCO/VICODIN) 5-325 MG tablet Take 2 tablets by mouth every 4 (four) hours as needed for moderate pain or severe pain. 90 tablet 0  . Melatonin 2.5 MG CAPS Take 2.5 mg by mouth at bedtime.     . mercaptopurine (PURINETHOL) 50 MG tablet TAKE 1 TABLET (50 MG TOTAL) BY MOUTH DAILY. 90 tablet 0  . metoprolol succinate (TOPROL-XL) 50 MG 24 hr tablet Take 1 tablet (50 mg total) by mouth daily. 90 tablet 3  . Multiple Vitamins-Minerals (MULTIVITAL) CHEW Chew 1 tablet by mouth daily. Reported on 03/23/2016    . ranitidine (ZANTAC) 150 MG tablet TAKE ONE TABLET BY MOUTH TWICE DAILY (Patient taking differently: TAKE 150MG  BY MOUTH AT BEDTIME) 180 tablet 0  . rivaroxaban (XARELTO) 20 MG TABS tablet Take 1 tablet (20 mg total) by mouth daily with supper. 14 tablet 0  . vedolizumab (ENTYVIO) 300 MG injection Inject 300 mg into the vein as directed. EVERY 8 WEEKS  No current facility-administered medications for  this visit.    Allergies  Allergen Reactions  . Morphine Other (See Comments)    Gives me crazy dreams  . Sulfonamide Derivatives Itching, Swelling and Rash     Review of Systems: All systems reviewed and negative except where noted in HPI.   Lab Results  Component Value Date   WBC 5.4 01/28/2017   HGB 10.1 (L) 01/28/2017   HCT 29.8 (L) 01/28/2017   MCV 98.7 01/28/2017   PLT 467.0 (H) 01/28/2017    CBC Latest Ref Rng & Units 01/28/2017 01/24/2017 01/19/2017  WBC 4.0 - 10.5 K/uL 5.4 4.3 4.7  Hemoglobin 12.0 - 15.0 g/dL 10.1(L) 10.0(L) 11.0(L)  Hematocrit 36.0 - 46.0 % 29.8(L) 29.6(L) 32.3(L)  Platelets 150.0 - 400.0 K/uL 467.0(H) 411.0(H) 310.0   Lab Results  Component Value Date   ALT 14 01/28/2017   AST 18 01/28/2017   ALKPHOS 54 01/28/2017   BILITOT 0.4 01/28/2017     Physical Exam: BP 130/78 (BP Location: Left Arm, Patient Position: Sitting, Cuff Size: Normal)   Pulse 96   Ht 5' 3.25" (1.607 m)   Wt 182 lb 6 oz (82.7 kg)   BMI 32.05 kg/m  Constitutional: Pleasant,well-developed, female in no acute distress. HEENT: Normocephalic and atraumatic. Conjunctivae are normal. No scleral icterus. Neck supple.  Cardiovascular: Normal rate, regular rhythm.  Pulmonary/chest: Effort normal and breath sounds normal. No wheezing, rales or rhonchi. Abdominal: Soft, nondistended, nontender. There are no masses palpable. No hepatomegaly. Extremities: no edema Lymphadenopathy: No cervical adenopathy noted. Neurological: Alert and oriented to person place and time. Skin: Skin is warm and dry. No rashes noted. Psychiatric: Normal mood and affect. Behavior is normal.   ASSESSMENT AND PLAN: 61 year old female here for follow-up visit:  Please see prior clinic notes for full details of her history. I suspect she either has Crohn's disease of the pouch versus chronic refractory pouchitis. Most recently on Entyvio and 6-MP following consultation with Dr. Emelda Fear at [facility], 6MP was on  board at a low dose to help prevent immunogenicity. She had tolerated the dose quite well, and was on low dose Uloric for gout, with stable labs, but then developed symptomatic anemia. Iron stores normal. It's possible the 6-MP could have been related to the anemia, versus active Crohns / pouchitis.   We'll plan to perform flex sigmoidoscopy to reassess the pouch and assess for active inflammation, however she was recently diagnosed with an upper extremity DVT. We will await Hematology evaluation to see how long she needs to be on anticoagulation, as we would ideally avoid endoscopy until she can safely hold anticoagulation for a few days. She is set to see hematology in the near future and she will let me know when she will be allowed to do this. Otherwise her CBC appears stable, her primary care is monitoring this. We will hold 6MP at this time and continue with Entyvio mono-therapy. Given she appears to feel better right after an infusion, it's possible her levels are low and will obtain this through [facility] as trough, if we can get it approved, which may alter her dosing. She agreed with the plan, I will await to ger back from her to schedule pouchoscopy following her Hematology consultation.  [place] Cellar, MD [facility] Gastro[facility]ology Pager [phone]

## 2017-02-14 ENCOUNTER — Encounter: Payer: Medicare Other | Admitting: Gastroenterology

## 2017-02-14 ENCOUNTER — Ambulatory Visit: Payer: Medicare Other

## 2017-02-14 ENCOUNTER — Ambulatory Visit (HOSPITAL_BASED_OUTPATIENT_CLINIC_OR_DEPARTMENT_OTHER): Payer: Medicare Other

## 2017-02-14 DIAGNOSIS — I82A12 Acute embolism and thrombosis of left axillary vein: Secondary | ICD-10-CM | POA: Diagnosis not present

## 2017-02-14 LAB — CMP (CANCER CENTER ONLY)
ALT(SGPT): 28 U/L (ref 10–47)
AST: 30 U/L (ref 11–38)
Albumin: 3.6 g/dL (ref 3.3–5.5)
Alkaline Phosphatase: 73 U/L (ref 26–84)
BUN, Bld: 28 mg/dL — ABNORMAL HIGH (ref 7–22)
CO2: 26 mEq/L (ref 18–33)
Calcium: 10.1 mg/dL (ref 8.0–10.3)
Chloride: 102 mEq/L (ref 98–108)
Creat: 2 mg/dl — ABNORMAL HIGH (ref 0.6–1.2)
Glucose, Bld: 97 mg/dL (ref 73–118)
Potassium: 3.2 mEq/L — ABNORMAL LOW (ref 3.3–4.7)
Sodium: 136 mEq/L (ref 128–145)
Total Bilirubin: 0.6 mg/dl (ref 0.20–1.60)
Total Protein: 8 g/dL (ref 6.4–8.1)

## 2017-02-14 LAB — CBC WITH DIFFERENTIAL (CANCER CENTER ONLY)
BASO#: 0 10*3/uL (ref 0.0–0.2)
BASO%: 0.4 % (ref 0.0–2.0)
EOS%: 1.8 % (ref 0.0–7.0)
Eosinophils Absolute: 0.2 10*3/uL (ref 0.0–0.5)
HCT: 29.4 % — ABNORMAL LOW (ref 34.8–46.6)
HGB: 10 g/dL — ABNORMAL LOW (ref 11.6–15.9)
LYMPH#: 2.9 10*3/uL (ref 0.9–3.3)
LYMPH%: 35 % (ref 14.0–48.0)
MCH: 34.4 pg — ABNORMAL HIGH (ref 26.0–34.0)
MCHC: 34 g/dL (ref 32.0–36.0)
MCV: 101 fL (ref 81–101)
MONO#: 0.9 10*3/uL (ref 0.1–0.9)
MONO%: 10.8 % (ref 0.0–13.0)
NEUT#: 4.4 10*3/uL (ref 1.5–6.5)
NEUT%: 52 % (ref 39.6–80.0)
Platelets: 223 10*3/uL (ref 145–400)
RBC: 2.91 10*6/uL — ABNORMAL LOW (ref 3.70–5.32)
RDW: 20.9 % — ABNORMAL HIGH (ref 11.1–15.7)
WBC: 8.4 10*3/uL (ref 3.9–10.0)

## 2017-02-15 ENCOUNTER — Other Ambulatory Visit: Payer: Self-pay

## 2017-02-15 ENCOUNTER — Telehealth: Payer: Self-pay

## 2017-02-15 ENCOUNTER — Other Ambulatory Visit: Payer: Self-pay | Admitting: Family Medicine

## 2017-02-15 DIAGNOSIS — K50119 Crohn's disease of large intestine with unspecified complications: Secondary | ICD-10-CM

## 2017-02-15 LAB — BETA-2-GLYCOPROTEIN I ABS, IGG/M/A
Beta-2 Glyco 1 IgA: <9 GPI IgA units (ref 0–25)
Beta-2 Glyco 1 IgM: <9 GPI IgM units (ref 0–32)
Beta-2 Glycoprotein I Ab, IgG: <9 GPI IgG units (ref 0–20)

## 2017-02-15 LAB — PROTEIN S, TOTAL: Protein S, Total: 138 % (ref 60–150)

## 2017-02-15 LAB — PROTEIN S ACTIVITY: Protein S-Functional: 121 % (ref 63–140)

## 2017-02-15 LAB — D-DIMER, QUANTITATIVE: D-DIMER: 0.38 mg/L FEU (ref 0.00–0.49)

## 2017-02-15 LAB — PROTEIN C ACTIVITY: Protein C-Functional: 188 % — ABNORMAL HIGH (ref 73–180)

## 2017-02-15 LAB — ANTITHROMBIN III: Antithrombin Activity: 137 % — ABNORMAL HIGH (ref 75–135)

## 2017-02-15 MED ORDER — RIVAROXABAN 20 MG PO TABS: 20.0000 mg | ORAL_TABLET | Freq: Every day | ORAL | 0 refills | Status: DC

## 2017-02-15 MED ORDER — METOPROLOL SUCCINATE ER 50 MG PO TB24: 50.0000 mg | ORAL_TABLET | Freq: Every day | ORAL | 3 refills | Status: DC

## 2017-02-15 MED ORDER — RANITIDINE HCL 150 MG PO TABS: 150.0000 mg | ORAL_TABLET | Freq: Two times a day (BID) | ORAL | 0 refills | Status: DC

## 2017-02-15 NOTE — Telephone Encounter (Signed)
Spoke to patient, she will come by our office on Monday July 9 to pick up kit to test trough level of Entyvio. Paperwork is completed, box is on my desk. Patient will need to take this to the lab downstairs, they will draw blood and mail off specimen. Patient will have her next Entyvio infusion on Tuesday July 10.

## 2017-02-15 NOTE — Telephone Encounter (Signed)
-----   Message from Manus Gunning, MD sent at 02/11/2017  5:31 PM EDT ----- Regarding: entyvio level Hi Almyra Free, This patient needs an Entyvio trough level drawn, to be done just prior to her next infusion (at the infusion center, before they infuse). I sent you an email forwarded from [place] how to order it.  I hope it makes sense, if not, [facility] can help.  Thanks!

## 2017-02-15 NOTE — Addendum Note (Signed)
Addended by: Sharon Seller B on: 02/15/2017 09:11 AM   Modules accepted: Orders

## 2017-02-16 ENCOUNTER — Ambulatory Visit (HOSPITAL_BASED_OUTPATIENT_CLINIC_OR_DEPARTMENT_OTHER): Payer: Medicare Other | Admitting: Hematology & Oncology

## 2017-02-16 VITALS — BP 155/89 | HR 105 | Temp 98.6°F | Resp 20 | Ht 63.0 in | Wt 180.0 lb

## 2017-02-16 DIAGNOSIS — D649 Anemia, unspecified: Secondary | ICD-10-CM

## 2017-02-16 DIAGNOSIS — K5190 Ulcerative colitis, unspecified, without complications: Secondary | ICD-10-CM

## 2017-02-16 DIAGNOSIS — D131 Benign neoplasm of stomach: Secondary | ICD-10-CM

## 2017-02-16 DIAGNOSIS — D500 Iron deficiency anemia secondary to blood loss (chronic): Secondary | ICD-10-CM

## 2017-02-16 DIAGNOSIS — D510 Vitamin B12 deficiency anemia due to intrinsic factor deficiency: Secondary | ICD-10-CM

## 2017-02-16 DIAGNOSIS — I82611 Acute embolism and thrombosis of superficial veins of right upper extremity: Secondary | ICD-10-CM | POA: Diagnosis not present

## 2017-02-16 LAB — LUPUS ANTICOAGULANT PANEL
PTT-LA: 29.3 s (ref 0.0–51.9)
dRVVT Confirm: 1.4 ratio — ABNORMAL HIGH (ref 0.8–1.2)
dRVVT Mix: 59.4 s — ABNORMAL HIGH (ref 0.0–47.0)
dRVVT: 72.8 s — ABNORMAL HIGH (ref 0.0–47.0)

## 2017-02-16 LAB — CARDIOLIPIN ANTIBODIES, IGG, IGM, IGA
Anticardiolipin Ab,IgA,Qn: <9 APL U/mL (ref 0–11)
Anticardiolipin Ab,IgG,Qn: <9 GPL U/mL (ref 0–14)
Anticardiolipin Ab,IgM,Qn: <9 MPL U/mL (ref 0–12)

## 2017-02-16 LAB — PROTEIN C, TOTAL: Protein C Antigen: 121 % (ref 60–150)

## 2017-02-16 NOTE — Progress Notes (Signed)
Referral MD  Reason for Referral: Right cephalic and basilic vein thrombus; ulcerative colitis; (+) Lupus anticoagulant   Chief Complaint  Patient presents with  . New Patient (Initial Visit)  : I have a blood clot in my right arm.  HPI: [name] is a very charming 61 year old white female. She has a very interesting history. She has a history of ulcerative colitis. She actually has had a complete colectomy. This was back in 1998. there was no malignancy. She has been on monoclonal antibody therapy as the J-pouch has developed ulcerative colitis.  She recently had some bleeding. She felt that this was because she had her Entyvio followed by an IV infusion. Her hemoglobin dropped down to 6.5. She was admitted to the hospital. She has also been on 6-MP. This has been stopped. She has chronic renal insufficiency. She sees nephrology for this. Per she says that there has been some bright red blood in her stool. This is not anything new.  She was given 2 units of blood. She did not require any type of endoscopic evaluation.  About a couple weeks later, she developed some thrombophlebitis in the right forearm. She then developed swelling in the right arm. She had a Doppler done on May 28. This showed a superficial thrombus in the right cephalic and basilic vein. This extended to the axillary vein which was open.  She was started on Xarelto. She is unwell on Xarelto. Her right arm no longer is painful.  She was kindly referred to the [facility] for further evaluation.  She does not smoke. There is no history of blood clots in the family. She has had 2 children. She has had 2 abortions. These were voluntary.  She's had her mammograms. Her most recent mammogram was in April 2018. This was okay.  She has had hypercoagulable studies done. Of course, she tested positive for the lupus anticoagulant.  She is not a vegetarian. She has not lost weight. She's gained a little bit  overweight.  There's been no change in her medications. She's not been on any estrogens.  Overall, her performance status is ECOG 1.     Past Medical History:  Diagnosis Date  . Abdominal pain    Hx  . Anal stenosis   . Anemia   . Anxiety   . Asthma    patient does not have inhaler  . Blood in stool    Hx  . Blood in urine    Hx  . De Quervain's tenosynovitis   . Depression   . Difficulty urinating    Hx  . Easy bruising   . Esophagitis   . Fainting    History - resolved - due to dehydration  . Fatigue    Hx  . Fibroid    Hx  . Gastric polyp   . GERD (gastroesophageal reflux disease)   . 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 Hemorrhoids, external   . Hemorrhoids, internal   . Herpes, genital    vaginal treated 07/05/12 and pt states is resolved  . History of cervical dysplasia   . History of small bowel obstruction   . Hyperlipidemia    currently no meds  . Hyperparathyroidism   . Hypertension   . Hypokalemia    Hx  . 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 Obesity   .  Ovarian cyst   . Poor dental hygiene   . Pulmonary nodule, right    right upper lobe  . Rectal bleeding    Hx  . Rectal pain    Hx  . Renal insufficiency    CKD - stage 2  . RLS (restless legs syndrome)    no meds  . Tooth infection 11/2016   right low  . Ulcerative colitis   . Visual disturbance    wears glasses  . Weakness generalized    Hx - patient denies generalized weakness  . Wears dentures    upper only  :  Past Surgical History:  Procedure Laterality Date  . ANAL DILATION    . CERVICAL BIOPSY  W/ LOOP ELECTRODE EXCISION    . CHOLECYSTECTOMY    . COLONOSCOPY     [name]  . fatty tumor removed from back     X 2  . HEMORRHOID SURGERY    . ILEOSTOMY CLOSURE    . RESTORATIVE PROCTOCOLECTOMY     with insertion of ileoanal J Pouch with loop ileostomy  .  TOTAL ABDOMINAL HYSTERECTOMY  1998   TAH/LSO  . TUBAL LIGATION    . UPPER GASTROINTESTINAL ENDOSCOPY     [name]  :   Current Outpatient Prescriptions:  .  febuxostat (ULORIC) 40 MG tablet, Take 40 mg by mouth [name]., Disp: , Rfl:  .  BIOTIN PO, Take by mouth [name]., Disp: , Rfl:  .  calcitRIOL (ROCALTROL) 0.25 MCG capsule, Take 0.25 mcg by mouth every other day. Mon, Wed, Fri, Disp: , Rfl:  .  DULoxetine (CYMBALTA) 60 MG capsule, Take 2 capsules (120 mg total) by mouth [name]., Disp: 180 capsule, Rfl: 0 .  HYDROcodone-acetaminophen (NORCO/VICODIN) 5-325 MG tablet, Take 2 tablets by mouth every 4 (four) hours as needed for moderate pain or severe pain., Disp: 90 tablet, Rfl: 0 .  Melatonin 2.5 MG CAPS, Take 2.5 mg by mouth at bedtime. , Disp: , Rfl:  .  metoprolol succinate (TOPROL-XL) 50 MG 24 hr tablet, Take 1 tablet (50 mg total) by mouth [name]., Disp: 90 tablet, Rfl: 3 .  Multiple Vitamins-Minerals (MULTIVITAL) CHEW, Chew 1 tablet by mouth [name]. Reported on 03/23/2016, Disp: , Rfl:  .  ranitidine (ZANTAC) 150 MG tablet, Take 1 tablet (150 mg total) by mouth 2 (two) times [name]., Disp: 180 tablet, Rfl: 0 .  rivaroxaban (XARELTO) 20 MG TABS tablet, Take 1 tablet (20 mg total) by mouth [name] with supper., Disp: 90 tablet, Rfl: 0 .  vedolizumab (ENTYVIO) 300 MG injection, Inject 300 mg into the vein as directed. EVERY 8 WEEKS, Disp: , Rfl: :  :  Allergies  Allergen Reactions  . Morphine Other (See Comments)    Gives me crazy dreams  . Sulfonamide Derivatives Itching, Swelling and Rash  :  Family History  Problem Relation Age of Onset  . Ulcerative colitis Father   . Hypertension Father   . Heart attack Father   . Hypertension Mother   . Heart disease Mother        s/p pci  . Ulcerative colitis Daughter   . Irritable bowel syndrome Unknown        grandchildren  . Diabetes Sister   . Cancer Sister        uterine  . Cancer Maternal Uncle        LUNG  . Colon cancer Neg Hx    . Esophageal cancer Neg Hx   . Stomach cancer Neg Hx   .  Rectal cancer Neg Hx   :  Social History   Social History  . Marital status: Divorced    Spouse name: N/A  . Number of children: N/A  . Years of education: N/A   Occupational History  . Not on file.   Social History Main Topics  . Smoking status: Former Smoker    Packs/day: 1.00    Years: 4.00    Types: Cigarettes    Quit date: 05/21/1979  . Smokeless tobacco: Never Used  . Alcohol use No  . Drug use: No  . Sexual activity: Yes    Birth control/ protection: Post-menopausal   Other Topics Concern  . Not on file   Social History Narrative  . No narrative on file  :  Pertinent items are noted in HPI.  Exam:Well-developed and well-nourished white female in no obvious distress. Vital signs show a temperature of 98.6. Pulse 105. Blood pressure 155/89. Weight is 180 pounds. Head and neck exam shows no ocular or oral lesions. There are no palpable cervical or supraclavicular lymph nodes. Lungs are clear. Cardiac exam regular in rhythm with no murmurs, rubs or bruits. Abdomen is soft. She has laparotomy scars. She has good bowel sounds. There is no fluid wave. There is no palpable liver or spleen tip. Back exam shows no tenderness over the spine, ribs or hips. Extremities shows some slight nonpitting edema of the right arm. She has good pulses. No venous cord is noted. Neurological exam shows no focal neurological deficit or skin exam shows no rashes, ecchymoses or petechia.    Recent Labs  02/14/17 1439  WBC 8.4  HGB 10.0*  HCT 29.4*  PLT 223    Recent Labs  02/14/17 1439  NA 136  K 3.2*  CL 102  CO2 26  GLUCOSE 97  BUN 28*  CREATININE 2.0*  CALCIUM 10.1    Blood smear review: None   Pathology: None     Assessment and Plan:  [name] is a very nice 61 year old white female. She has a very incredible an interesting history. She has ulcerative colitis. She has had a colectomy. She is on monoclonal  antibody therapy for this.  It looks like she has a superficial thrombus of the right arm.  She tested positive for a lupus anticoagulant. This is no surprise since most patients nowadays test positive for this. I'm not sure why the lab is calibrated for this test.  We will keep her on Xarelto for 3 months. I think this is all that she needs.  She does have the ulcerative colitis. This is a risk factor for thromboembolic disease. This seems to be under pretty good control with the Entyvio.  She does have anemia. I think we have to evaluate her for this. At bleeding that she probably is iron deficient. She also may be B-12 deficient. She also may be erythropoietin deficient.  I spent about an hour with her. She is very nice.  I'll like to see her back in about a month. I will get a Doppler of her right arm to see how the thromboembolic disease is responding.  I will repeat her lupus anticoagulant. I'll do some anemia studies.  I answered all of her questions.

## 2017-02-21 LAB — FACTOR 5 LEIDEN

## 2017-02-21 LAB — PROTHROMBIN GENE MUTATION

## 2017-02-25 ENCOUNTER — Other Ambulatory Visit: Payer: Self-pay | Admitting: Family Medicine

## 2017-02-25 DIAGNOSIS — F32A Depression, unspecified: Secondary | ICD-10-CM

## 2017-02-25 DIAGNOSIS — F329 Major depressive disorder, single episode, unspecified: Secondary | ICD-10-CM

## 2017-03-03 ENCOUNTER — Telehealth: Payer: Self-pay | Admitting: Gastroenterology

## 2017-03-03 ENCOUNTER — Other Ambulatory Visit: Payer: Self-pay

## 2017-03-03 DIAGNOSIS — R5383 Other fatigue: Secondary | ICD-10-CM

## 2017-03-03 DIAGNOSIS — D509 Iron deficiency anemia, unspecified: Secondary | ICD-10-CM

## 2017-03-03 NOTE — Telephone Encounter (Signed)
Patient has had headache and increased fatigue last 4 days. She states she also has had intermittent rectal bleeding. She is due for next Entyvio infusion on 7/10, also getting a trough level the morning just prior to her infusion. Dr. Marin Olp had her labs drawn about two weeks ago.

## 2017-03-03 NOTE — Telephone Encounter (Signed)
Patient advised, labs ordered. She does not know when she will stop Xarelto, but will let us know and can schedule at that time.

## 2017-03-03 NOTE — Telephone Encounter (Signed)
We can check a CBC to ensure stable anemia. Dr. Marin Olp had recommended multiple blood draws during his visit which she did not get done yet, she should do that. She should still get Entyvio infused on 7/10 and draw trough level prior to infusion. Dr. Marin Olp wants her on anticoagulation for 3 months for her recent clot. Once she comes off of this I think she should have a pouchoscopy to assess for her response to [facility] - if she knows when she will stop the anticoagulant we can schedule this. Thanks

## 2017-03-04 ENCOUNTER — Other Ambulatory Visit: Payer: Self-pay

## 2017-03-04 ENCOUNTER — Other Ambulatory Visit (INDEPENDENT_AMBULATORY_CARE_PROVIDER_SITE_OTHER): Payer: Medicare Other

## 2017-03-04 DIAGNOSIS — D509 Iron deficiency anemia, unspecified: Secondary | ICD-10-CM

## 2017-03-04 DIAGNOSIS — K91850 Pouchitis: Secondary | ICD-10-CM

## 2017-03-04 DIAGNOSIS — R5383 Other fatigue: Secondary | ICD-10-CM | POA: Diagnosis not present

## 2017-03-04 DIAGNOSIS — D72829 Elevated white blood cell count, unspecified: Secondary | ICD-10-CM

## 2017-03-04 LAB — CBC WITH DIFFERENTIAL/PLATELET
Basophils Absolute: 0 10*3/uL (ref 0.0–0.1)
Basophils Relative: 0.3 % (ref 0.0–3.0)
Eosinophils Absolute: 0.5 10*3/uL (ref 0.0–0.7)
Eosinophils Relative: 4.4 % (ref 0.0–5.0)
HCT: 25.1 % — ABNORMAL LOW (ref 36.0–46.0)
Hemoglobin: 8.5 g/dL — ABNORMAL LOW (ref 12.0–15.0)
Lymphocytes Relative: 24.7 % (ref 12.0–46.0)
Lymphs Abs: 2.8 10*3/uL (ref 0.7–4.0)
MCHC: 34 g/dL (ref 30.0–36.0)
MCV: 102.5 fl — ABNORMAL HIGH (ref 78.0–100.0)
Monocytes Absolute: 0.9 10*3/uL (ref 0.1–1.0)
Monocytes Relative: 7.8 % (ref 3.0–12.0)
Neutro Abs: 7 10*3/uL (ref 1.4–7.7)
Neutrophils Relative %: 62.8 % (ref 43.0–77.0)
Platelets: 252 10*3/uL (ref 150.0–400.0)
RBC: 2.45 Mil/uL — ABNORMAL LOW (ref 3.87–5.11)
RDW: 21.7 % — ABNORMAL HIGH (ref 11.5–15.5)
WBC: 11.2 10*3/uL — ABNORMAL HIGH (ref 4.0–10.5)

## 2017-03-04 MED ORDER — METRONIDAZOLE 500 MG PO TABS: 500.0000 mg | ORAL_TABLET | Freq: Three times a day (TID) | ORAL | 0 refills | Status: AC

## 2017-03-07 ENCOUNTER — Telehealth: Payer: Self-pay | Admitting: Gastroenterology

## 2017-03-07 ENCOUNTER — Other Ambulatory Visit: Payer: Self-pay | Admitting: *Deleted

## 2017-03-07 ENCOUNTER — Other Ambulatory Visit (INDEPENDENT_AMBULATORY_CARE_PROVIDER_SITE_OTHER): Payer: Medicare Other

## 2017-03-07 DIAGNOSIS — D509 Iron deficiency anemia, unspecified: Secondary | ICD-10-CM

## 2017-03-07 LAB — CBC WITH DIFFERENTIAL/PLATELET
Basophils Absolute: 0 10*3/uL (ref 0.0–0.1)
Basophils Relative: 0.4 % (ref 0.0–3.0)
Eosinophils Absolute: 0.6 10*3/uL (ref 0.0–0.7)
Eosinophils Relative: 7 % — ABNORMAL HIGH (ref 0.0–5.0)
HCT: 24.3 % — ABNORMAL LOW (ref 36.0–46.0)
Hemoglobin: 8.4 g/dL — ABNORMAL LOW (ref 12.0–15.0)
Lymphocytes Relative: 31.1 % (ref 12.0–46.0)
Lymphs Abs: 2.9 10*3/uL (ref 0.7–4.0)
MCHC: 34.6 g/dL (ref 30.0–36.0)
MCV: 102.8 fl — ABNORMAL HIGH (ref 78.0–100.0)
Monocytes Absolute: 0.7 10*3/uL (ref 0.1–1.0)
Monocytes Relative: 7.1 % (ref 3.0–12.0)
Neutro Abs: 5 10*3/uL (ref 1.4–7.7)
Neutrophils Relative %: 54.4 % (ref 43.0–77.0)
Platelets: 265 10*3/uL (ref 150.0–400.0)
RBC: 2.36 Mil/uL — ABNORMAL LOW (ref 3.87–5.11)
RDW: 21.1 % — ABNORMAL HIGH (ref 11.5–15.5)
WBC: 9.2 10*3/uL (ref 4.0–10.5)

## 2017-03-07 MED ORDER — RIVAROXABAN 20 MG PO TABS: 20.0000 mg | ORAL_TABLET | Freq: Every day | ORAL | 0 refills | Status: DC

## 2017-03-08 ENCOUNTER — Other Ambulatory Visit: Payer: Self-pay

## 2017-03-08 ENCOUNTER — Telehealth: Payer: Self-pay | Admitting: Gastroenterology

## 2017-03-08 LAB — C. DIFFICILE GDH AND TOXIN A/B
C. difficile GDH: DETECTED — AB
C. difficile Toxin A/B: NOT DETECTED

## 2017-03-08 LAB — CLOSTRIDIUM DIFFICILE BY PCR: Toxigenic C. Difficile by PCR: DETECTED — CR

## 2017-03-08 MED ORDER — VANCOMYCIN HCL 125 MG PO CAPS: 125.0000 mg | ORAL_CAPSULE | Freq: Four times a day (QID) | ORAL | 0 refills | Status: AC

## 2017-03-08 NOTE — Telephone Encounter (Signed)
Routed to DOD, patient of Dr. Havery Moros. [facility] lab called with positive C-diff result. Please also look at CBC done on Monday. Thanks.

## 2017-03-08 NOTE — Telephone Encounter (Signed)
Left message for patient to call back. Rx for vancomycin sent to her pharmacy.

## 2017-03-08 NOTE — Telephone Encounter (Signed)
Patient had been started on flagyl in the interim, do you want her to stop this? Vancomycin is available to be picked up today.

## 2017-03-08 NOTE — Telephone Encounter (Signed)
Birth to her and asked her to hold the metronidazole and stay on the vancomycin. I think she's probably okay to have her Entyvio on July 10 which is a week from now. I told her I would forward things to Dr. Havery Moros as well.

## 2017-03-08 NOTE — Telephone Encounter (Signed)
CBC stale low x 2 days no intervention  She is C diff + - treat with vancomycin 125 mg qid x 10 days please  Please check to see that her pharmacy can fill this by tomorrow if not have her go to [facility]

## 2017-03-10 ENCOUNTER — Other Ambulatory Visit: Payer: Self-pay

## 2017-03-10 DIAGNOSIS — K50111 Crohn's disease of large intestine with rectal bleeding: Secondary | ICD-10-CM

## 2017-03-10 MED ORDER — VEDOLIZUMAB 300 MG IV SOLR: 300.0000 mg | Freq: Once | INTRAVENOUS | Status: DC

## 2017-03-10 NOTE — Telephone Encounter (Signed)
Follow up from conversation with Dr. Havery Moros this morning:  Plan for moving forward, since I will be out of the office, is to sign this out to another nurse, Vaughan Basta. Please call patient on Monday, 7/9 to see how patient is doing. Patient should have been on vancomycin for + C Diff since 7/3. Patient is due for Entyvio level draw prior to her infusion (7/10). Even if patient is still not feeling better, Dr. Havery Moros wants her to come have her levels drawn by our lab. Patient will need to come pick up [employer] box, which is on my desk. Please relay to Dr. Havery Moros how patient is doing, depending on how she is, [name] may have to rescheduled. Last I spoke to patient she had planned to come by office to get labs done first, then go to infusion at [facility] short stay. If she wants to come by Monday, 7/9 to get labs done that is fine.

## 2017-03-10 NOTE — Telephone Encounter (Signed)
Thanks for your help with this patient [name], agree that she should be on the vancomycin for the C Diff. She is scheduled for Entyvio on July 10th with Entyvio levels being drawn - if she thinks her C diff symptoms are still persisting at that time, [name] would need to be held, probably by 2 weeks (this is a risk factor for C diff). However, we have her set up to have Entyvio levels drawn at her next infusion, delaying her dose will alter these levels and make them difficult to interpret. If she is feeling well in the upcoming days with resolution of her prior symptoms, we may still be able to proceed with the Entyvio. Can you please touch base with her on Monday July 9, we will see how she is doing then and make a decision about her Entyvio dose for the 10th. If her dose is delayed due to this, we will need to cancel the Entyvio levels at they won't be accurate. If she has any questions about this in the interim please let me know.

## 2017-03-14 ENCOUNTER — Other Ambulatory Visit: Payer: Self-pay

## 2017-03-14 DIAGNOSIS — K921 Melena: Secondary | ICD-10-CM

## 2017-03-14 NOTE — Telephone Encounter (Signed)
Thanks for the update. Let's plan on continuing with the plan for [facility] tomorrow if she is feeling better and she should continue full course of Vancomycin. She should have Entyvio level drawn prior to the infusion. I would also like to add a CBC to ensure her anemia is stable. If her labs are stable and she is feeling better after being treated for the C Diff, will continue regimen and plan flex sig in 3 months (once off her anticoagulation per Dr. Marin Olp). If she is doing poorly in the interim she needs to contact us but I will relay results of CBC to her. Thanks

## 2017-03-14 NOTE — Telephone Encounter (Signed)
Pt states that her diarrhea is better, she is still a little tired. Reports she still sees a little BRB in her stool but not with every stool. States she plans to come by the office tomorrow to have labs done prior to her Entyvio infusion. Please advise if any new recommendations.

## 2017-03-14 NOTE — Telephone Encounter (Signed)
Left message for pt to call back  °

## 2017-03-14 NOTE — Telephone Encounter (Signed)
Pt aware, order in for CBC.

## 2017-03-15 ENCOUNTER — Other Ambulatory Visit (INDEPENDENT_AMBULATORY_CARE_PROVIDER_SITE_OTHER): Payer: Medicare Other

## 2017-03-15 ENCOUNTER — Telehealth: Payer: Self-pay | Admitting: Gastroenterology

## 2017-03-15 ENCOUNTER — Telehealth: Payer: Self-pay

## 2017-03-15 ENCOUNTER — Encounter (HOSPITAL_COMMUNITY): Payer: Self-pay

## 2017-03-15 ENCOUNTER — Encounter (HOSPITAL_COMMUNITY)
Admission: RE | Admit: 2017-03-15 | Discharge: 2017-03-15 | Disposition: A | Discharge status: Home / Self Care | Payer: Medicare Other | Source: Ambulatory Visit | Attending: Gastroenterology | Admitting: Gastroenterology

## 2017-03-15 ENCOUNTER — Other Ambulatory Visit: Payer: Self-pay

## 2017-03-15 DIAGNOSIS — K921 Melena: Secondary | ICD-10-CM | POA: Diagnosis not present

## 2017-03-15 DIAGNOSIS — K50118 Crohn's disease of large intestine with other complication: Secondary | ICD-10-CM | POA: Insufficient documentation

## 2017-03-15 DIAGNOSIS — K51918 Ulcerative colitis, unspecified with other complication: Secondary | ICD-10-CM

## 2017-03-15 DIAGNOSIS — K50119 Crohn's disease of large intestine with unspecified complications: Secondary | ICD-10-CM | POA: Diagnosis not present

## 2017-03-15 LAB — CBC WITH DIFFERENTIAL/PLATELET
Basophils Absolute: 0 10*3/uL (ref 0.0–0.1)
Basophils Relative: 0.3 % (ref 0.0–3.0)
Eosinophils Absolute: 0.5 10*3/uL (ref 0.0–0.7)
Eosinophils Relative: 7.4 % — ABNORMAL HIGH (ref 0.0–5.0)
HCT: 23.1 % — CL (ref 36.0–46.0)
Hemoglobin: 8 g/dL — CL (ref 12.0–15.0)
Lymphocytes Relative: 33.3 % (ref 12.0–46.0)
Lymphs Abs: 2.3 10*3/uL (ref 0.7–4.0)
MCHC: 34.7 g/dL (ref 30.0–36.0)
MCV: 104.3 fl — ABNORMAL HIGH (ref 78.0–100.0)
Monocytes Absolute: 0.5 10*3/uL (ref 0.1–1.0)
Monocytes Relative: 7.3 % (ref 3.0–12.0)
Neutro Abs: 3.6 10*3/uL (ref 1.4–7.7)
Neutrophils Relative %: 51.7 % (ref 43.0–77.0)
Platelets: 331 10*3/uL (ref 150.0–400.0)
RBC: 2.22 Mil/uL — ABNORMAL LOW (ref 3.87–5.11)
RDW: 20.1 % — ABNORMAL HIGH (ref 11.5–15.5)
WBC: 6.9 10*3/uL (ref 4.0–10.5)

## 2017-03-15 MED ORDER — VEDOLIZUMAB 300 MG IV SOLR
300.0000 mg | Freq: Once | INTRAVENOUS | Status: AC
  Administered 2017-03-15: 300 mg via INTRAVENOUS
  Filled 2017-03-15: qty 5

## 2017-03-15 MED ORDER — SODIUM CHLORIDE 0.9 % IV SOLN
INTRAVENOUS | Status: DC
  Administered 2017-03-15: 10:00:00 via INTRAVENOUS

## 2017-03-15 NOTE — Progress Notes (Signed)
Patient here for Entyvio. Just came from Dr. Doyne Keel office having labs drawn. She informed this RN she was diagnosed with cdiff 1.5 weeks ago and is currently on antibiotics. Contact isolation precautions begun while she is here in [facility] short stay.

## 2017-03-15 NOTE — Telephone Encounter (Signed)
Thanks for the note. Unfortunately she has a progressive anemia, slowly downtrending. Dr. Marin Olp had ordered some labs recently including iron studies, I don't see that she had it drawn. Her 6MP was stopped and anemia persists, I don't think this is the cause. 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 She is on anticoagulation at this time.  I think we should repeat a CBC later this week along with iron studies, she may need iron suppplementation and a transfusion if anemia worsens.  Dr. Marin Olp, I'm concerned about this worsening anemia. She may need a transfusion again soon. Do you have any other thoughts? I would like to perform an endoscopy to evaluate her pouch, but I understand you would like to continue anticoagulation a few months before we could hold it, is that true? I could consider doing it while on anticoagulation but I would not take many biopsies. Thanks for your opinion.

## 2017-03-15 NOTE — Telephone Encounter (Signed)
Received call from Lab, pt has critical Hgb of 8.0, Hct 23.1. Pt is to have entyvio today at [facility] short stay.

## 2017-03-15 NOTE — Progress Notes (Signed)
Just wanted to make sure her Entyvio orders were signed.

## 2017-03-15 NOTE — Progress Notes (Signed)
[name] I got this message from you but don't see any text. Anything I can help with? Thanks

## 2017-03-15 NOTE — Telephone Encounter (Signed)
Patient notified that labs are about the same and that Dr. Havery Moros is going to communicate with Dr. Marin Olp about continued anemia and that he will come up with a plan and we will get back with her.

## 2017-03-16 ENCOUNTER — Other Ambulatory Visit: Payer: Self-pay

## 2017-03-16 DIAGNOSIS — D509 Iron deficiency anemia, unspecified: Secondary | ICD-10-CM

## 2017-03-16 NOTE — Telephone Encounter (Signed)
Spoke with pt and she knows to come for labs Friday, orders in epic.

## 2017-03-18 ENCOUNTER — Telehealth: Payer: Self-pay | Admitting: Gastroenterology

## 2017-03-18 ENCOUNTER — Other Ambulatory Visit: Payer: Self-pay | Admitting: Family

## 2017-03-18 ENCOUNTER — Other Ambulatory Visit (INDEPENDENT_AMBULATORY_CARE_PROVIDER_SITE_OTHER): Payer: Medicare Other

## 2017-03-18 DIAGNOSIS — D509 Iron deficiency anemia, unspecified: Secondary | ICD-10-CM

## 2017-03-18 LAB — CBC WITH DIFFERENTIAL/PLATELET
Basophils Absolute: 0 10*3/uL (ref 0.0–0.1)
Basophils Relative: 0.3 % (ref 0.0–3.0)
Eosinophils Absolute: 0.4 10*3/uL (ref 0.0–0.7)
Eosinophils Relative: 5.2 % — ABNORMAL HIGH (ref 0.0–5.0)
HCT: 23.6 % — CL (ref 36.0–46.0)
Hemoglobin: 8.1 g/dL — ABNORMAL LOW (ref 12.0–15.0)
Lymphocytes Relative: 22.6 % (ref 12.0–46.0)
Lymphs Abs: 1.8 10*3/uL (ref 0.7–4.0)
MCHC: 34.3 g/dL (ref 30.0–36.0)
MCV: 106.1 fl — ABNORMAL HIGH (ref 78.0–100.0)
Monocytes Absolute: 0.5 10*3/uL (ref 0.1–1.0)
Monocytes Relative: 5.8 % (ref 3.0–12.0)
Neutro Abs: 5.3 10*3/uL (ref 1.4–7.7)
Neutrophils Relative %: 66.1 % (ref 43.0–77.0)
Platelets: 366 10*3/uL (ref 150.0–400.0)
RBC: 2.23 Mil/uL — ABNORMAL LOW (ref 3.87–5.11)
RDW: 19.7 % — ABNORMAL HIGH (ref 11.5–15.5)
WBC: 8 10*3/uL (ref 4.0–10.5)

## 2017-03-18 LAB — FERRITIN: Ferritin: 117.5 ng/mL (ref 10.0–291.0)

## 2017-03-18 LAB — IBC PANEL
Iron: 74 ug/dL (ref 42–145)
Saturation Ratios: 19.2 % — ABNORMAL LOW (ref 20.0–50.0)
Transferrin: 275 mg/dL (ref 212.0–360.0)

## 2017-03-18 NOTE — Telephone Encounter (Signed)
I had already spoken with this patient before I had this note. She is aware she is stable with her CBC. She expresses understanding of the warning signs she will monitor for that would indicate she should go to the ER for assistance. When I received this note, I called the patient again. No answer. Left a message asking her to be certain she is not taking the 6MP.

## 2017-03-18 NOTE — Telephone Encounter (Signed)
Pt states she will come to the office for her labs.

## 2017-03-18 NOTE — Telephone Encounter (Signed)
Called Dr. Antonieta Pert office, he was out today, I spoke with one of his colleagues. [name] had a repeat CBC today - Hgb is at 8.1, previously at 8.0. Her iron studies are pending but her MCV is 106.  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 She stopped 6MP when she presented with anemia previously. I'd like to perform a pouchoscopy to clarify how active her disease is and if this is driving her anemia, but she is on anticoagulation. She has been on this since late May or early June. Dr. Marin Olp will be back in the office on Monday, will hope to discuss with him when it may be safe to hold the Xarelto now for a procedure. If we can't hold it, I will consider doing the exam on Xarelto to look, may not take many biopsies.   Hgb is persistently low but stable, if it progresses then she may need a transfusion.  Can you please relay this to the patient - I am sure she is fatigued with the anemia, but if things get worse over the weekend (shortness of breath, etc) she would need to go to the hospital for transfusion. We will hope to get back to her early next week with further recommendations. Please ensure she has stopped 6MP (she should have done this weeks ago but want to confirm). I will otherwise await results of iron studies.  Thanks

## 2017-03-18 NOTE — Telephone Encounter (Signed)
Dr. Havery Moros notified.

## 2017-03-18 NOTE — Telephone Encounter (Signed)
Patient called back states she is no longer taking the 47m since Dr.Armbruster told her to stop taking this medication.

## 2017-03-21 ENCOUNTER — Other Ambulatory Visit: Payer: Self-pay | Admitting: Family

## 2017-03-22 ENCOUNTER — Telehealth: Payer: Self-pay

## 2017-03-22 ENCOUNTER — Other Ambulatory Visit: Payer: Self-pay

## 2017-03-22 ENCOUNTER — Telehealth: Payer: Self-pay | Admitting: Family Medicine

## 2017-03-22 DIAGNOSIS — K50118 Crohn's disease of large intestine with other complication: Secondary | ICD-10-CM

## 2017-03-22 DIAGNOSIS — M10071 Idiopathic gout, right ankle and foot: Secondary | ICD-10-CM

## 2017-03-22 NOTE — Telephone Encounter (Signed)
Mailed patient her prep instructions for the Flex Sigmoidoscopy, scheduled for 7/26 at 7:30 [facility]. I have also mailed patient the consent, asked that she bring this back with her the day of the procedure.

## 2017-03-22 NOTE — Telephone Encounter (Signed)
Database ran and on your desk

## 2017-03-22 NOTE — Telephone Encounter (Signed)
database 

## 2017-03-22 NOTE — Telephone Encounter (Signed)
Self  Refill request for HYDROcodone. Pt says that she have a apt upstairs tomorrow morning she would like to pick up then if possible    CB: [phone]

## 2017-03-22 NOTE — Telephone Encounter (Signed)
Requesting:   hydrocodone Contract     07/27/2016 UDS    Low risk due on 5/18 Last OV    07/27/2016 Last Refill     #90 no refills on 11/23/2016  Please Advise

## 2017-03-23 ENCOUNTER — Ambulatory Visit (HOSPITAL_BASED_OUTPATIENT_CLINIC_OR_DEPARTMENT_OTHER): Payer: Medicare Other

## 2017-03-23 ENCOUNTER — Ambulatory Visit (HOSPITAL_BASED_OUTPATIENT_CLINIC_OR_DEPARTMENT_OTHER)
Admission: RE | Admit: 2017-03-23 | Discharge: 2017-03-23 | Disposition: A | Discharge status: Home / Self Care | Payer: Medicare Other | Source: Ambulatory Visit | Attending: Hematology & Oncology | Admitting: Hematology & Oncology

## 2017-03-23 ENCOUNTER — Other Ambulatory Visit (HOSPITAL_BASED_OUTPATIENT_CLINIC_OR_DEPARTMENT_OTHER): Payer: Medicare Other

## 2017-03-23 ENCOUNTER — Ambulatory Visit (HOSPITAL_BASED_OUTPATIENT_CLINIC_OR_DEPARTMENT_OTHER): Payer: Medicare Other | Admitting: Hematology & Oncology

## 2017-03-23 ENCOUNTER — Other Ambulatory Visit: Payer: Self-pay | Admitting: Family

## 2017-03-23 VITALS — BP 137/86 | HR 96 | Temp 98.4°F | Resp 18 | Wt 187.0 lb

## 2017-03-23 VITALS — BP 148/82 | HR 83 | Temp 98.0°F | Resp 18

## 2017-03-23 DIAGNOSIS — D500 Iron deficiency anemia secondary to blood loss (chronic): Secondary | ICD-10-CM | POA: Diagnosis not present

## 2017-03-23 DIAGNOSIS — D131 Benign neoplasm of stomach: Secondary | ICD-10-CM

## 2017-03-23 DIAGNOSIS — Z86718 Personal history of other venous thrombosis and embolism: Secondary | ICD-10-CM

## 2017-03-23 DIAGNOSIS — D510 Vitamin B12 deficiency anemia due to intrinsic factor deficiency: Secondary | ICD-10-CM

## 2017-03-23 DIAGNOSIS — K5190 Ulcerative colitis, unspecified, without complications: Secondary | ICD-10-CM

## 2017-03-23 DIAGNOSIS — D508 Other iron deficiency anemias: Secondary | ICD-10-CM

## 2017-03-23 DIAGNOSIS — I82601 Acute embolism and thrombosis of unspecified veins of right upper extremity: Secondary | ICD-10-CM | POA: Diagnosis not present

## 2017-03-23 DIAGNOSIS — R760 Raised antibody titer: Secondary | ICD-10-CM

## 2017-03-23 DIAGNOSIS — I82611 Acute embolism and thrombosis of superficial veins of right upper extremity: Secondary | ICD-10-CM | POA: Diagnosis not present

## 2017-03-23 DIAGNOSIS — I808 Phlebitis and thrombophlebitis of other sites: Secondary | ICD-10-CM | POA: Insufficient documentation

## 2017-03-23 LAB — CBC WITH DIFFERENTIAL (CANCER CENTER ONLY)
BASO#: 0 10*3/uL (ref 0.0–0.2)
BASO%: 0.3 % (ref 0.0–2.0)
EOS%: 3.7 % (ref 0.0–7.0)
Eosinophils Absolute: 0.2 10*3/uL (ref 0.0–0.5)
HCT: 23.5 % — ABNORMAL LOW (ref 34.8–46.6)
HGB: 7.6 g/dL — ABNORMAL LOW (ref 11.6–15.9)
LYMPH#: 1.9 10*3/uL (ref 0.9–3.3)
LYMPH%: 28.5 % (ref 14.0–48.0)
MCH: 35.8 pg — ABNORMAL HIGH (ref 26.0–34.0)
MCHC: 32.3 g/dL (ref 32.0–36.0)
MCV: 111 fL — ABNORMAL HIGH (ref 81–101)
MONO#: 0.5 10*3/uL (ref 0.1–0.9)
MONO%: 7.9 % (ref 0.0–13.0)
NEUT#: 3.9 10*3/uL (ref 1.5–6.5)
NEUT%: 59.6 % (ref 39.6–80.0)
Platelets: 337 10*3/uL (ref 145–400)
RBC: 2.12 10*6/uL — ABNORMAL LOW (ref 3.70–5.32)
RDW: 17.7 % — ABNORMAL HIGH (ref 11.1–15.7)
WBC: 6.6 10*3/uL (ref 3.9–10.0)

## 2017-03-23 LAB — RETICULOCYTES: Reticulocyte Count: 4.9 % — ABNORMAL HIGH (ref 0.6–2.6)

## 2017-03-23 LAB — FERRITIN: Ferritin: 110 ng/ml (ref 9–269)

## 2017-03-23 LAB — CHCC SATELLITE - SMEAR

## 2017-03-23 MED ORDER — SODIUM CHLORIDE 0.9 % IV SOLN
Freq: Once | INTRAVENOUS | Status: AC
  Administered 2017-03-23: 12:00:00 via INTRAVENOUS

## 2017-03-23 MED ORDER — FOLIC ACID 1 MG PO TABS: 2.0000 mg | ORAL_TABLET | Freq: Every day | ORAL | 3 refills | Status: DC

## 2017-03-23 MED ORDER — HYDROCODONE-ACETAMINOPHEN 5-325 MG PO TABS: 2.0000 | ORAL_TABLET | ORAL | 0 refills | Status: DC | PRN

## 2017-03-23 MED ORDER — RIVAROXABAN 10 MG PO TABS: 10.0000 mg | ORAL_TABLET | Freq: Every day | ORAL | 0 refills | Status: DC

## 2017-03-23 MED ORDER — FERUMOXYTOL INJECTION 510 MG/17 ML
510.0000 mg | Freq: Once | INTRAVENOUS | Status: AC
  Administered 2017-03-23: 510 mg via INTRAVENOUS
  Filled 2017-03-23: qty 17

## 2017-03-23 NOTE — Addendum Note (Signed)
Addended by: Kem Boroughs D on: 03/23/2017 01:27 PM   Modules accepted: Orders

## 2017-03-23 NOTE — Telephone Encounter (Signed)
Refill x1 

## 2017-03-23 NOTE — Progress Notes (Signed)
Hematology and Oncology Follow Up Visit  [name] [id number] 1956/07/11 61 y.o. 03/23/2017   Principle Diagnosis:   Recurrent iron deficiency anemia secondary to ulcerative colitis  Right cephalic/basilic vein thrombus - positive lupus anticoagulant  Current Therapy:    IV iron as indicated - dose to be given on 03/23/2017  Xarelto 20 mg by mouth daily     Interim History:  [name] is back for second office visit. We first saw her back in June. At that time, she presented with a thrombus in the right arm. 2 no surprise, she also has a lupus anticoagulant. Again, it seems like most patients now from the lab that does R testing, have a lupus anticoagulant.  She had a repeat Doppler today. There is no thrombus noted in the right arm.  Her major problem right now is the fact that she is bleeding. She has ulcerative colitis. She has a J-pouch. She sees her gastroenterologist next week for an endoscopic procedure.  She sees bleeding when she has a bowel movement.  She does have iron deficiency. She has received IV iron in the past.  With [name], her ferritin is not useful as an indicator for iron deficiency as her ferritin will be on the high side, because of the inflammatory issues with her ulcerative colitis. We use her iron saturation.  She had a iron saturation of 19% when she had iron studies tested last week.  She's had no fever. She's had no arm swelling. She's had no leg issues.  Overall, I would say performance status is ECOG 1. As above  Medications:  Current Outpatient Prescriptions:  .  BIOTIN PO, Take by mouth daily., Disp: , Rfl:  .  calcitRIOL (ROCALTROL) 0.25 MCG capsule, Take 0.25 mcg by mouth every other day. Mon, Wed, Fri, Disp: , Rfl:  .  DULoxetine (CYMBALTA) 60 MG capsule, Take 2 capsules (120 mg total) by mouth daily., Disp: 60 capsule, Rfl: 0 .  febuxostat (ULORIC) 40 MG tablet, Take 40 mg by mouth daily., Disp: , Rfl:  .   HYDROcodone-acetaminophen (NORCO/VICODIN) 5-325 MG tablet, Take 2 tablets by mouth every 4 (four) hours as needed for moderate pain or severe pain., Disp: 90 tablet, Rfl: 0 .  Melatonin 2.5 MG CAPS, Take 2.5 mg by mouth at bedtime. , Disp: , Rfl:  .  metoprolol succinate (TOPROL-XL) 50 MG 24 hr tablet, Take 1 tablet (50 mg total) by mouth daily., Disp: 90 tablet, Rfl: 3 .  Multiple Vitamins-Minerals (MULTIVITAL) CHEW, Chew 1 tablet by mouth daily. Reported on 03/23/2016, Disp: , Rfl:  .  ranitidine (ZANTAC) 150 MG tablet, Take 1 tablet (150 mg total) by mouth 2 (two) times daily., Disp: 180 tablet, Rfl: 0 .  rivaroxaban (XARELTO) 20 MG TABS tablet, Take 1 tablet (20 mg total) by mouth daily with supper., Disp: 90 tablet, Rfl: 0 .  vedolizumab (ENTYVIO) 300 MG injection, Inject 300 mg into the vein as directed. EVERY 8 WEEKS, Disp: , Rfl:   Current Facility-Administered Medications:  .  vedolizumab (ENTYVIO) 300 mg in sodium chloride 0.9 % 250 mL infusion, 300 mg, Intravenous, Once, Armbruster, Renelda Loma, MD  Allergies:  Allergies  Allergen Reactions  . Morphine Other (See Comments)    Gives me crazy dreams  . Sulfonamide Derivatives Itching, Swelling and Rash    Past Medical History, Surgical history, Social history, and Family History were reviewed and updated.  Review of Systems: Head and neck exam shows no ocular or  oral lesions. There are no palpable cervical or supraclavicular lymph nodes. Lungs are clear. Cardiac exam regular in rhythm with no murmurs, rubs or bruits. Abdomen is soft. She has laparotomy scars. She has good bowel sounds. There is no fluid wave. There is no palpable liver or spleen tip. Back exam shows no tenderness over the spine, ribs or hips. Extremities shows some slight nonpitting edema of the right arm. She has good pulses. No venous cord is noted. Neurological exam shows no focal neurological deficit or skin exam shows no rashes, ecchymoses or petechia.  Physical  Exam:  weight is 187 lb (84.8 kg). Her oral temperature is 98.4 F (36.9 C). Her blood pressure is 137/86 and her pulse is 96. Her respiration is 18 and oxygen saturation is 100%.   Wt Readings from Last 3 Encounters:  03/23/17 187 lb (84.8 kg)  03/15/17 185 lb 9.6 oz (84.2 kg)  02/16/17 180 lb (81.6 kg)     See above  Lab Results  Component Value Date   WBC 6.6 03/23/2017   HGB 7.6 (L) 03/23/2017   HCT 23.5 (L) 03/23/2017   MCV 111 (H) 03/23/2017   PLT 337 03/23/2017     Chemistry      Component Value Date/Time   NA 136 02/14/2017 1439   K 3.2 (L) 02/14/2017 1439   CL 102 02/14/2017 1439   CO2 26 02/14/2017 1439   BUN 28 (H) 02/14/2017 1439   CREATININE 2.0 (H) 02/14/2017 1439      Component Value Date/Time   CALCIUM 10.1 02/14/2017 1439   CALCIUM 8.9 04/12/2013 0851   ALKPHOS 73 02/14/2017 1439   AST 30 02/14/2017 1439   ALT 28 02/14/2017 1439   BILITOT 0.60 02/14/2017 1439         Impression and Plan: [name] is  A 61 year old white female. She has ulcerative colitis. She had a thrombus which was superficial in the right arm. This has resolved.  We will clearly cut her Xarelto dose. We will hope would be able to stop his Xarelto in another month or so.  Her main problem is the GI bleeding. She says that her J-pouch is always irritated. This will be noted when she has her endoscopy.  We will go ahead and give her iron today. I don't think she needs a blood transfusion. Hopefully with the iron this will help.  I'll saw to get her on folic acid.  I am not sure at all whether not she actually has a true likely significant lupus anticoagulant.   I will like to see her back in another month. At that point, we might be able to get her off the Xarelto completely.  This is a little more complicated than I had initially thought. I spent about 40 minutes with her today.  We will give her another dose of iron next week.      7/18/201811:39 AM

## 2017-03-23 NOTE — Telephone Encounter (Signed)
Thanks for the follow up I appreciate it. I guess she has been bleeding more than I thought she had been. Hopefully the IV iron will help and taking her off anticoagulation when she is able to. She is scheduled for a endoscopy with me next week, I agree that her pouch is likely inflamed, and will have to discuss options with her after I evaluate it, very challenging case. Thanks, Richardson Landry

## 2017-03-23 NOTE — Patient Instructions (Signed)

## 2017-03-23 NOTE — Telephone Encounter (Signed)
Rx printed and awaiting signature.  Dr. Etter Sjogren will be back in office 03/24/17.

## 2017-03-24 ENCOUNTER — Ambulatory Visit: Payer: Medicare Other

## 2017-03-24 LAB — ERYTHROPOIETIN: Erythropoietin: 94.1 m[IU]/mL — ABNORMAL HIGH (ref 2.6–18.5)

## 2017-03-24 LAB — VITAMIN B12: Vitamin B12: 398 pg/mL (ref 232–1245)

## 2017-03-24 NOTE — Telephone Encounter (Signed)
Patient informed hardcopy for hydrocodone ready for pickup at the front desk.

## 2017-03-24 NOTE — Telephone Encounter (Signed)
Ok to refill  Indication for chronic opioid: chronic pain Medication and dose: hydrocodone 5/325 # pills per month: 90 Last UDS date: due Pain contract signed (Y/N): due Date narcotic database last reviewed (include red flags): 03/23/2017

## 2017-03-25 ENCOUNTER — Encounter: Payer: Self-pay | Admitting: Gastroenterology

## 2017-03-25 LAB — LUPUS ANTICOAGULANT PANEL
PTT-LA: 34.9 s (ref 0.0–51.9)
dRVVT Confirm: 1.3 ratio — ABNORMAL HIGH (ref 0.8–1.2)
dRVVT Mix: 73.5 s — ABNORMAL HIGH (ref 0.0–47.0)
dRVVT: 92.6 s — ABNORMAL HIGH (ref 0.0–47.0)

## 2017-03-27 ENCOUNTER — Other Ambulatory Visit: Payer: Self-pay | Admitting: Gastroenterology

## 2017-03-27 ENCOUNTER — Other Ambulatory Visit: Payer: Self-pay | Admitting: Family Medicine

## 2017-03-27 DIAGNOSIS — F329 Major depressive disorder, single episode, unspecified: Secondary | ICD-10-CM

## 2017-03-27 DIAGNOSIS — F32A Depression, unspecified: Secondary | ICD-10-CM

## 2017-03-28 LAB — FACTOR 5 LEIDEN

## 2017-03-28 NOTE — Telephone Encounter (Signed)
It looks like the pharmacy sent the request. I am not sure if it is a automated thing or not. I have not spoken with the pt. I will not refill it.

## 2017-03-28 NOTE — Telephone Encounter (Signed)
Ok to refill 

## 2017-03-28 NOTE — Telephone Encounter (Signed)
[name] this was stopped due to anemia, she should not be taking any of this right now. Did she call to refill?

## 2017-03-29 LAB — PROTHROMBIN GENE MUTATION

## 2017-03-30 ENCOUNTER — Ambulatory Visit (HOSPITAL_BASED_OUTPATIENT_CLINIC_OR_DEPARTMENT_OTHER): Payer: Medicare Other

## 2017-03-30 ENCOUNTER — Other Ambulatory Visit: Payer: Self-pay | Admitting: *Deleted

## 2017-03-30 ENCOUNTER — Telehealth: Payer: Self-pay | Admitting: Gastroenterology

## 2017-03-30 VITALS — BP 122/72 | HR 88 | Temp 98.0°F | Resp 20

## 2017-03-30 DIAGNOSIS — K5190 Ulcerative colitis, unspecified, without complications: Secondary | ICD-10-CM

## 2017-03-30 DIAGNOSIS — D500 Iron deficiency anemia secondary to blood loss (chronic): Secondary | ICD-10-CM

## 2017-03-30 DIAGNOSIS — D508 Other iron deficiency anemias: Secondary | ICD-10-CM

## 2017-03-30 LAB — CBC WITH DIFFERENTIAL (CANCER CENTER ONLY)
BASO#: 0 10*3/uL (ref 0.0–0.2)
BASO%: 0.3 % (ref 0.0–2.0)
EOS%: 2.4 % (ref 0.0–7.0)
Eosinophils Absolute: 0.2 10*3/uL (ref 0.0–0.5)
HCT: 22.7 % — ABNORMAL LOW (ref 34.8–46.6)
HGB: 7.4 g/dL — ABNORMAL LOW (ref 11.6–15.9)
LYMPH#: 2.1 10*3/uL (ref 0.9–3.3)
LYMPH%: 31.1 % (ref 14.0–48.0)
MCH: 36.5 pg — ABNORMAL HIGH (ref 26.0–34.0)
MCHC: 32.6 g/dL (ref 32.0–36.0)
MCV: 112 fL — ABNORMAL HIGH (ref 81–101)
MONO#: 0.7 10*3/uL (ref 0.1–0.9)
MONO%: 10.7 % (ref 0.0–13.0)
NEUT#: 3.7 10*3/uL (ref 1.5–6.5)
NEUT%: 55.5 % (ref 39.6–80.0)
Platelets: 309 10*3/uL (ref 145–400)
RBC: 2.03 10*6/uL — ABNORMAL LOW (ref 3.70–5.32)
RDW: 16.8 % — ABNORMAL HIGH (ref 11.1–15.7)
WBC: 6.6 10*3/uL (ref 3.9–10.0)

## 2017-03-30 MED ORDER — SODIUM CHLORIDE 0.9 % IV SOLN
Freq: Once | INTRAVENOUS | Status: AC
  Administered 2017-03-30: 13:00:00 via INTRAVENOUS

## 2017-03-30 MED ORDER — FERUMOXYTOL INJECTION 510 MG/17 ML
510.0000 mg | Freq: Once | INTRAVENOUS | Status: AC
  Administered 2017-03-30: 510 mg via INTRAVENOUS
  Filled 2017-03-30: qty 17

## 2017-03-30 NOTE — Patient Instructions (Signed)

## 2017-03-30 NOTE — Telephone Encounter (Signed)
Questions answered.  I reviewed her prep instructions with her and she knows to drink magnesium citrate today "as I normally do to prep for this procedure."

## 2017-03-31 ENCOUNTER — Encounter: Payer: Self-pay | Admitting: Gastroenterology

## 2017-03-31 ENCOUNTER — Ambulatory Visit (AMBULATORY_SURGERY_CENTER): Payer: Medicare Other | Admitting: Gastroenterology

## 2017-03-31 VITALS — BP 127/73 | HR 83 | Temp 98.0°F | Resp 19 | Ht 63.0 in | Wt 187.0 lb

## 2017-03-31 DIAGNOSIS — K50119 Crohn's disease of large intestine with unspecified complications: Secondary | ICD-10-CM

## 2017-03-31 DIAGNOSIS — K91850 Pouchitis: Secondary | ICD-10-CM

## 2017-03-31 DIAGNOSIS — K529 Noninfective gastroenteritis and colitis, unspecified: Secondary | ICD-10-CM | POA: Diagnosis not present

## 2017-03-31 DIAGNOSIS — K6289 Other specified diseases of anus and rectum: Secondary | ICD-10-CM | POA: Diagnosis not present

## 2017-03-31 MED ORDER — AMBULATORY NON FORMULARY MEDICATION: 0 refills | Status: DC

## 2017-03-31 MED ORDER — SODIUM CHLORIDE 0.9 % IV SOLN: 500.0000 mL | INTRAVENOUS | Status: DC

## 2017-03-31 NOTE — Progress Notes (Signed)
Alert and oriented x3, pleased with MAC, report to RN

## 2017-03-31 NOTE — Progress Notes (Signed)
Called to room to assist during endoscopic procedure.  Patient ID and intended procedure confirmed with present staff. Received instructions for my participation in the procedure from the performing physician.  

## 2017-03-31 NOTE — Progress Notes (Signed)
Finished antibiotic for c diff on 03-21-17- she took 10 day dose of Vancomycin and stated she finished entire course of antibiotic.

## 2017-03-31 NOTE — Patient Instructions (Signed)
YOU HAD AN ENDOSCOPIC PROCEDURE TODAY AT [facility]:   Refer to the procedure report that was given to you for any specific questions about what was found during the examination.  If the procedure report does not answer your questions, please call your gastroenterologist to clarify.  If you requested that your care partner not be given the details of your procedure findings, then the procedure report has been included in a sealed envelope for you to review at your convenience later.  YOU SHOULD EXPECT: Some feelings of bloating in the abdomen. Passage of more gas than usual.  Walking can help get rid of the air that was put into your GI tract during the procedure and reduce the bloating. If you had a lower endoscopy (such as a colonoscopy or flexible sigmoidoscopy) you may notice spotting of blood in your stool or on the toilet paper. If you underwent a bowel prep for your procedure, you may not have a normal bowel movement for a few days.  Please Note:  You might notice some irritation and congestion in your nose or some drainage.  This is from the oxygen used during your procedure.  There is no need for concern and it should clear up in a day or so.  SYMPTOMS TO REPORT IMMEDIATELY:   Following lower endoscopy (colonoscopy or flexible sigmoidoscopy):  Excessive amounts of blood in the stool  Significant tenderness or worsening of abdominal pains  Swelling of the abdomen that is new, acute  Fever of 100F or higher  Medications: Topical nitroglycerin ointment 0.125% three times a day to the affected area May resume Xarelto tomorrow.  For urgent or emergent issues, a gastroenterologist can be reached at any hour by calling [phone].   DIET:  We do recommend a small meal at first, but then you may proceed to your regular diet.  Drink plenty of fluids but you should avoid alcoholic beverages for 24 hours.  ACTIVITY:  You should plan to take it easy for the rest of today  and you should NOT DRIVE or use heavy machinery until tomorrow (because of the sedation medicines used during the test).    FOLLOW UP: Our staff will call the number listed on your records the next business day following your procedure to check on you and address any questions or concerns that you may have regarding the information given to you following your procedure. If we do not reach you, we will leave a message.  However, if you are feeling well and you are not experiencing any problems, there is no need to return our call.  We will assume that you have returned to your regular daily activities without incident.  If any biopsies were taken you will be contacted by phone or by letter within the next 1-3 weeks.  Please call us at [phone] if you have not heard about the biopsies in 3 weeks.    SIGNATURES/CONFIDENTIALITY: You and/or your care partner have signed paperwork which will be entered into your electronic medical record.  These signatures attest to the fact that that the information above on your After Visit Summary has been reviewed and is understood.  Full responsibility of the confidentiality of this discharge information lies with you and/or your care-partner.  Thank you for letting us take care of your healthcare needs today.

## 2017-03-31 NOTE — Op Note (Signed)
[place] Patient Name: [name] Procedure Date: 03/31/2017 7:53 AM MRN: [id number] Endoscopist: Remo Lipps P. 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 The patient's tolerance of                            previous anesthesia was also reviewed. The risks                            and benefits of the procedure and the sedation                            options and risks were discussed with the patient.                            All questions were answered, and informed consent                            was obtained. Prior Anticoagulants: The patient has                            taken Xarelto (rivaroxaban), last dose was 3 days                            prior to procedure. ASA Grade Assessment: III - A  patient with severe systemic disease. After                            reviewing the risks and benefits, the patient was                            deemed in satisfactory condition to undergo the                            procedure.                           After obtaining informed consent, the endoscope was                            passed under direct vision. Throughout the   procedure, the patient's blood pressure, pulse, and                            oxygen saturations were monitored continuously. The                            Endoscope was introduced through the anus and                            advanced to the the neo-terminal ileum. After                            obtaining informed consent, the endoscope was                            passed under direct vision. Throughout the                            procedure, the patient's blood pressure, pulse, and                            oxygen saturations were monitored continuously.The                            procedure was performed without difficulty. The                            patient tolerated the procedure well. The quality                            of the bowel preparation was poor. Scope In: Scope Out: Findings:                 The perianal exam findings include anal fissure and                            a mild anal canal stenosis.                           The ileal  pouch had focal ulceration at an                            angulated turn entering the more proximal ileum.                            The mucosa of the distal pouch appeared relatively                            healthy other than focal ulceration at angulated                            turn, but ulceration and fissuring of the anal                            canal was noted entering the pouch. The bowel prep                            was poor in the more proximal ileum - it appeared                            relatively healthy with exception of another focal                            area of ulceration proximally. Biopsies were taken                            with a cold forceps for histology from the pouch                            and more proximal ileum. Complications:            No immediate complications. Estimated blood loss:                            Minimal. Estimated Blood Loss:     Estimated blood loss was  minimal. Impression:               - Anal fissure found on perianal exam.                           - Mild anal canal stenosis                           - Ulceration of the anal canal                           - Focal ulceration of the pouch and more proximal                            ileum, although appeared improved overall compared                            to prior exam Recommendation:           -  Discharge patient to home.                           - Resume previous diet.                           - Continue present medications.                           - Resume Xarelto tomorrow                           - Trial of topical nitroglycerin ointment 0.125%                            TID for anal fissure                           - Consider adding course of budesonide 51m daily -                            I will speak with Dr. OEmelda Fearof [facility] whom the                            patient has seen about future medical management                            and get back to her shortly (Entyvio level mid 4s,                            no antibodies) SRemo LippsP. Armbruster MD, MD 03/31/2017 8:24:07 AM This report has been signed electronically.

## 2017-04-01 ENCOUNTER — Other Ambulatory Visit: Payer: Self-pay

## 2017-04-01 ENCOUNTER — Telehealth: Payer: Self-pay | Admitting: *Deleted

## 2017-04-01 ENCOUNTER — Telehealth: Payer: Self-pay | Admitting: Gastroenterology

## 2017-04-01 DIAGNOSIS — K50111 Crohn's disease of large intestine with rectal bleeding: Secondary | ICD-10-CM

## 2017-04-01 MED ORDER — TINIDAZOLE 500 MG PO TABS: 500.0000 mg | ORAL_TABLET | Freq: Two times a day (BID) | ORAL | 0 refills | Status: DC

## 2017-04-01 MED ORDER — CIPROFLOXACIN-CIPROFLOX HCL ER 500 MG PO TB24: 500.0000 mg | ORAL_TABLET | Freq: Every day | ORAL | 0 refills | Status: DC

## 2017-04-01 NOTE — Telephone Encounter (Signed)
Left message for patient to call our office. I have sent her antibiotics to her pharmacy and lab has been ordered.

## 2017-04-01 NOTE — Telephone Encounter (Signed)
Spoke to patient, she is advised of new plan, Rx sent to her pharmacy. She is interested in having home infusion for Entyvio, do you have that information about who to contact? Of course, this dosage change and place of service will need to be approved through her insurance. Thanks.

## 2017-04-01 NOTE — Telephone Encounter (Signed)
Sheri we had a presentation at the office several months ago from a company who performed home infusions. Do you have their information by any chance? I would like to coordinate home Entyvio infusions for this patient. thanks

## 2017-04-01 NOTE — Telephone Encounter (Signed)
  Follow up Call-  Call back number 03/31/2017 03/23/2016 08/29/2015  Post procedure Call Back phone  # [phone]  Permission to leave phone message Yes Yes Yes  Some recent data might be hidden     Patient questions:  Do you have a fever, pain , or abdominal swelling? No. Pain Score  0 *  Have you tolerated food without any problems? Yes.    Have you been able to return to your normal activities? Yes.    Do you have any questions about your discharge instructions: Diet   No. Medications  No. Follow up visit  No.  Do you have questions or concerns about your Care? No.  Actions: * If pain score is 4 or above: No action needed, pain <4.

## 2017-04-01 NOTE — Telephone Encounter (Signed)
[name] can you please contact the patient and relay the following:  - I have spoke with Dr. Emelda Fear at [facility] about her case - They recommend goal Entyvio level to > 12. Her level is 4.6. Dr. Emelda Fear had recommended increasing the dose of Entyvio to scheduled infusions every 4 weeks, versus trying another agent such as Stelara. I would recommend escalation of Entyvio to q 4 weeks at this time, and if it fails, then consider Stelara. Can you help coordinate dosing every 4 weeks? 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 I would like to order tinidazole 564m BID for one month and ciprofloxacin 5010mq day (extended release form, given her renal disease) for one month, she can start that now - Recommend repeat CBC on Monday or Tuesday next week to ensure stable Hgb - she should continue topical nitroglycerin ointment for fissures   If she has any questions please let me know. Thanks for your help

## 2017-04-04 ENCOUNTER — Other Ambulatory Visit: Payer: Self-pay

## 2017-04-04 MED ORDER — VEDOLIZUMAB 300 MG IV SOLR: 300.0000 mg | INTRAVENOUS | 12 refills | Status: DC

## 2017-04-04 NOTE — Telephone Encounter (Signed)
It was [name].  They never followed up and set up the protocols or sent back any information. They may still do it, but they never follow up with the protocols we discussed in the meeting.

## 2017-04-04 NOTE — Telephone Encounter (Addendum)
Called Melissa at [facility] [phone]), she will check with her pharmacy about what it will take to coordinate home infusions. She will call back to let us know.

## 2017-04-04 NOTE — Telephone Encounter (Signed)
[name], The company is [employer]. This patient asked me about home infusions, and if they can accommodate her for [facility] and is cheaper for her than the other options, which I expect it to be, I think she would want to proceed. Would you mind giving them a call and see if can be coordinated? Thanks [employer]

## 2017-04-05 ENCOUNTER — Other Ambulatory Visit: Payer: Self-pay

## 2017-04-05 ENCOUNTER — Other Ambulatory Visit (INDEPENDENT_AMBULATORY_CARE_PROVIDER_SITE_OTHER): Payer: Medicare Other

## 2017-04-05 ENCOUNTER — Other Ambulatory Visit: Payer: Self-pay | Admitting: Family Medicine

## 2017-04-05 DIAGNOSIS — K50111 Crohn's disease of large intestine with rectal bleeding: Secondary | ICD-10-CM | POA: Diagnosis not present

## 2017-04-05 DIAGNOSIS — K91850 Pouchitis: Secondary | ICD-10-CM

## 2017-04-05 LAB — CBC WITH DIFFERENTIAL/PLATELET
Basophils Absolute: 0 10*3/uL (ref 0.0–0.1)
Basophils Relative: 0.4 % (ref 0.0–3.0)
Eosinophils Absolute: 0.2 10*3/uL (ref 0.0–0.7)
Eosinophils Relative: 2 % (ref 0.0–5.0)
HCT: 25.4 % — ABNORMAL LOW (ref 36.0–46.0)
Hemoglobin: 8.8 g/dL — ABNORMAL LOW (ref 12.0–15.0)
Lymphocytes Relative: 20.3 % (ref 12.0–46.0)
Lymphs Abs: 1.9 10*3/uL (ref 0.7–4.0)
MCHC: 34 g/dL (ref 30.0–36.0)
MCV: 111.3 fl — ABNORMAL HIGH (ref 78.0–100.0)
Monocytes Absolute: 0.9 10*3/uL (ref 0.1–1.0)
Monocytes Relative: 9.2 % (ref 3.0–12.0)
Neutro Abs: 6.5 10*3/uL (ref 1.4–7.7)
Neutrophils Relative %: 68.1 % (ref 43.0–77.0)
Platelets: 353 10*3/uL (ref 150.0–400.0)
RBC: 2.27 Mil/uL — ABNORMAL LOW (ref 3.87–5.11)
RDW: 16.8 % — ABNORMAL HIGH (ref 11.5–15.5)
WBC: 9.5 10*3/uL (ref 4.0–10.5)

## 2017-04-05 NOTE — Telephone Encounter (Signed)
Received fax refill request from [facility] on [address] [place]   Mercaptopurine 50mg  qd  Is this ok to refill?

## 2017-04-05 NOTE — Telephone Encounter (Signed)
[name] please discontinue 6MP. This is the second time I have had a request for it. It has been stopped. thanks

## 2017-04-06 ENCOUNTER — Other Ambulatory Visit: Payer: Self-pay

## 2017-04-06 ENCOUNTER — Telehealth: Payer: Self-pay

## 2017-04-06 DIAGNOSIS — K91850 Pouchitis: Secondary | ICD-10-CM

## 2017-04-06 DIAGNOSIS — K50111 Crohn's disease of large intestine with rectal bleeding: Secondary | ICD-10-CM

## 2017-04-06 NOTE — Telephone Encounter (Signed)
Called patient to let her know about her next infusion appointment on 8/10. Orders for this infusion in Epic.

## 2017-04-06 NOTE — Telephone Encounter (Signed)
-----   Message from Fransico Setters, [place] sent at 04/06/2017  5:50 AM EDT ----- Regarding: Change in Frequency of Treatment Confirmation Contact: [phone] Good morning Almyra Free. I wanted to inform you that I received your voicemail about Ms. Rachel treatment frequency changing from every 8 weeks to every 4 weeks. I have scheduled Ms. Trigueros for Friday, 04/15/17 @ 11;00. Please inform patient of appointment date and time please and thank you as always.  [employer]

## 2017-04-15 ENCOUNTER — Telehealth: Payer: Self-pay | Admitting: Family Medicine

## 2017-04-15 ENCOUNTER — Encounter (HOSPITAL_COMMUNITY): Payer: Self-pay

## 2017-04-15 ENCOUNTER — Ambulatory Visit (HOSPITAL_COMMUNITY)
Admission: RE | Admit: 2017-04-15 | Discharge: 2017-04-15 | Disposition: A | Discharge status: Home / Self Care | Payer: Medicare Other | Source: Ambulatory Visit | Attending: Gastroenterology | Admitting: Gastroenterology

## 2017-04-15 DIAGNOSIS — F32A Depression, unspecified: Secondary | ICD-10-CM

## 2017-04-15 DIAGNOSIS — K91850 Pouchitis: Secondary | ICD-10-CM | POA: Insufficient documentation

## 2017-04-15 DIAGNOSIS — K50111 Crohn's disease of large intestine with rectal bleeding: Secondary | ICD-10-CM | POA: Insufficient documentation

## 2017-04-15 DIAGNOSIS — F329 Major depressive disorder, single episode, unspecified: Secondary | ICD-10-CM

## 2017-04-15 MED ORDER — SODIUM CHLORIDE 0.9 % IV SOLN
Freq: Once | INTRAVENOUS | Status: AC
  Administered 2017-04-15: 11:00:00 via INTRAVENOUS

## 2017-04-15 MED ORDER — VEDOLIZUMAB 300 MG IV SOLR
300.0000 mg | Freq: Once | INTRAVENOUS | Status: AC
  Administered 2017-04-15: 300 mg via INTRAVENOUS
  Filled 2017-04-15: qty 5

## 2017-04-15 MED ORDER — DULOXETINE HCL 60 MG PO CPEP: ORAL_CAPSULE | ORAL | 0 refills | Status: DC

## 2017-04-15 NOTE — Telephone Encounter (Signed)
Relation to pt: self Call back number:[phone] Pharmacy: [facility] mail order  Reason for call:  Patient requesting a 90 day supply of DULoxetine (CYMBALTA) 60 MG capsule and heartburn medication(patient doesn't know the name of Rx)  would like all maintenance medications to go to mail order

## 2017-04-15 NOTE — Telephone Encounter (Signed)
Called the patient informed she needs appointment. She did agree and scheduled her for a followup with PCP on 05/10/2017 at 9 AM. Sent in her [facility]ymbalta to [facility].

## 2017-04-15 NOTE — Discharge Instructions (Signed)
Vedolizumab injection solution What is this medicine? VEDOLIZUMAB (Ve doe LIZ you mab) is used to treat ulcerative colitis and Crohn's disease in adult patients. This medicine may be used for other purposes; ask your health care provider or pharmacist if you have questions. COMMON BRAND NAME(S): Entyvio What should I tell my health care provider before I take this medicine? They need to know if you have any of these conditions: -diabetes -hepatitis B or history of hepatitis B infection -HIV or AIDS -immune system problems -infection or history of infections -liver disease -recently received or scheduled to receive a vaccine -scheduled to have surgery -tuberculosis, a positive skin test for tuberculosis or have recently been in close contact with someone who has tuberculosis - an unusual or allergic reaction to vedolizumab, other medicines, foods, dyes, or preservatives -pregnant or trying to get pregnant -breast-feeding How should I use this medicine? This medicine is for infusion into a vein. It is given by a health care professional in a hospital or clinic setting. A special MedGuide will be given to you by the pharmacist with each prescription and refill. Be sure to read this information carefully each time. Talk to your pediatrician regarding the use of this medicine in children. This medicine is not approved for use in children. Overdosage: If you think you have taken too much of this medicine contact a poison control center or emergency room at once. NOTE: This medicine is only for you. Do not share this medicine with others. What if I miss a dose? It is important not to miss your dose. Call your doctor or health care professional if you are unable to keep an appointment. What may interact with this medicine? -steroid medicines like prednisone or cortisone -TNF-alpha inhibitors like natalizumab, adalimumab, and infliximab -vaccines This list may not describe all possible  interactions. Give your health care provider a list of all the medicines, herbs, non-prescription drugs, or dietary supplements you use. Also tell them if you smoke, drink alcohol, or use illegal drugs. Some items may interact with your medicine. What should I watch for while using this medicine? Your condition will be monitored carefully while you are receiving this medicine. Visit your doctor for regular check ups. Tell your doctor or healthcare professional if your symptoms do not start to get better or if they get worse. Stay away from people who are sick. Call your doctor or health care professional for advice if you get a fever, chills or sore throat, or other symptoms of a cold or flu. Do not treat yourself. In some patients, this medicine may cause a serious brain infection that may cause death. If you have any problems seeing, thinking, speaking, walking, or standing, tell your doctor right away. If you cannot reach your doctor, get urgent medical care. What side effects may I notice from receiving this medicine? Side effects that you should report to your doctor or health care professional as soon as possible: -allergic reactions like skin rash, itching or hives, swelling of the face, lips, or tongue -breathing problems -changes in vision -chest pain -dark urine -depression, feelings of sadness -dizziness -general ill feeling or flu-like symptoms -irregular, missed, or painful menstrual periods -light-colored stools -loss of appetite, nausea -muscle weakness -problems with balance, talking, or walking -right upper belly pain -unusually weak or tired -yellowing of the eyes or skin Side effects that usually do not require medical attention (report to your doctor or health care professional if they continue or are bothersome): -aches, pains -headache -  stomach upset -tiredness This list may not describe all possible side effects. Call your doctor for medical advice about side  effects. You may report side effects to FDA at 1-800-FDA-1088. Where should I keep my medicine? This drug is given in a hospital or clinic and will not be stored at home. NOTE: This sheet is a summary. It may not cover all possible information. If you have questions about this medicine, talk to your doctor, pharmacist, or health care provider.  2018 Elsevier/Gold Standard (2015-09-25 08:36:51)  

## 2017-04-19 ENCOUNTER — Other Ambulatory Visit: Payer: Self-pay

## 2017-04-21 ENCOUNTER — Other Ambulatory Visit: Payer: Self-pay

## 2017-04-21 MED ORDER — VEDOLIZUMAB 300 MG IV SOLR: 300.0000 mg | INTRAVENOUS | 12 refills | Status: DC

## 2017-04-22 ENCOUNTER — Telehealth: Payer: Self-pay | Admitting: Gastroenterology

## 2017-04-22 ENCOUNTER — Other Ambulatory Visit (INDEPENDENT_AMBULATORY_CARE_PROVIDER_SITE_OTHER): Payer: Medicare Other

## 2017-04-22 DIAGNOSIS — K91850 Pouchitis: Secondary | ICD-10-CM | POA: Diagnosis not present

## 2017-04-22 DIAGNOSIS — K50119 Crohn's disease of large intestine with unspecified complications: Secondary | ICD-10-CM

## 2017-04-22 LAB — CBC WITH DIFFERENTIAL/PLATELET
Basophils Absolute: 0.1 10*3/uL (ref 0.0–0.1)
Basophils Relative: 0.5 % (ref 0.0–3.0)
Eosinophils Absolute: 0.3 10*3/uL (ref 0.0–0.7)
Eosinophils Relative: 2.8 % (ref 0.0–5.0)
HCT: 24.9 % — ABNORMAL LOW (ref 36.0–46.0)
Hemoglobin: 8.2 g/dL — ABNORMAL LOW (ref 12.0–15.0)
Lymphocytes Relative: 23.8 % (ref 12.0–46.0)
Lymphs Abs: 2.6 10*3/uL (ref 0.7–4.0)
MCHC: 33 g/dL (ref 30.0–36.0)
MCV: 112.1 fl — ABNORMAL HIGH (ref 78.0–100.0)
Monocytes Absolute: 0.8 10*3/uL (ref 0.1–1.0)
Monocytes Relative: 7.3 % (ref 3.0–12.0)
Neutro Abs: 7.2 10*3/uL (ref 1.4–7.7)
Neutrophils Relative %: 65.6 % (ref 43.0–77.0)
Platelets: 373 10*3/uL (ref 150.0–400.0)
RBC: 2.22 Mil/uL — ABNORMAL LOW (ref 3.87–5.11)
RDW: 14.9 % (ref 11.5–15.5)
WBC: 11 10*3/uL — ABNORMAL HIGH (ref 4.0–10.5)

## 2017-04-25 NOTE — Telephone Encounter (Signed)
Pt aware and will have repeat labs in 10-14 days.  Order in [facility]

## 2017-04-25 NOTE — Telephone Encounter (Signed)
Armbruster, Renelda Loma, MD sent to Doristine Counter, RN        Labs show Hgb stable around 8.2 - Please check another CBC in 10-14 days to ensure stable. If she feels poorly in the interim let me know. thanks

## 2017-04-27 ENCOUNTER — Other Ambulatory Visit (HOSPITAL_BASED_OUTPATIENT_CLINIC_OR_DEPARTMENT_OTHER): Payer: Medicare Other

## 2017-04-27 ENCOUNTER — Ambulatory Visit (HOSPITAL_BASED_OUTPATIENT_CLINIC_OR_DEPARTMENT_OTHER): Payer: Medicare Other | Admitting: Family

## 2017-04-27 ENCOUNTER — Ambulatory Visit (HOSPITAL_BASED_OUTPATIENT_CLINIC_OR_DEPARTMENT_OTHER): Payer: Medicare Other

## 2017-04-27 VITALS — BP 126/84 | HR 100 | Resp 20

## 2017-04-27 VITALS — BP 130/84 | HR 107 | Temp 98.4°F | Resp 19 | Wt 183.0 lb

## 2017-04-27 DIAGNOSIS — I82611 Acute embolism and thrombosis of superficial veins of right upper extremity: Secondary | ICD-10-CM

## 2017-04-27 DIAGNOSIS — I808 Phlebitis and thrombophlebitis of other sites: Secondary | ICD-10-CM | POA: Diagnosis not present

## 2017-04-27 DIAGNOSIS — D500 Iron deficiency anemia secondary to blood loss (chronic): Secondary | ICD-10-CM

## 2017-04-27 DIAGNOSIS — K5190 Ulcerative colitis, unspecified, without complications: Secondary | ICD-10-CM | POA: Diagnosis not present

## 2017-04-27 DIAGNOSIS — Z86718 Personal history of other venous thrombosis and embolism: Secondary | ICD-10-CM

## 2017-04-27 DIAGNOSIS — D649 Anemia, unspecified: Secondary | ICD-10-CM

## 2017-04-27 LAB — CBC WITH DIFFERENTIAL (CANCER CENTER ONLY)
BASO#: 0 10*3/uL (ref 0.0–0.2)
BASO%: 0.4 % (ref 0.0–2.0)
EOS%: 4 % (ref 0.0–7.0)
Eosinophils Absolute: 0.3 10*3/uL (ref 0.0–0.5)
HCT: 25.5 % — ABNORMAL LOW (ref 34.8–46.6)
HGB: 8.3 g/dL — ABNORMAL LOW (ref 11.6–15.9)
LYMPH#: 1.9 10*3/uL (ref 0.9–3.3)
LYMPH%: 24.4 % (ref 14.0–48.0)
MCH: 37.4 pg — ABNORMAL HIGH (ref 26.0–34.0)
MCHC: 32.5 g/dL (ref 32.0–36.0)
MCV: 115 fL — ABNORMAL HIGH (ref 81–101)
MONO#: 0.7 10*3/uL (ref 0.1–0.9)
MONO%: 8.6 % (ref 0.0–13.0)
NEUT#: 4.9 10*3/uL (ref 1.5–6.5)
NEUT%: 62.6 % (ref 39.6–80.0)
Platelets: 320 10*3/uL (ref 145–400)
RBC: 2.22 10*6/uL — ABNORMAL LOW (ref 3.70–5.32)
RDW: 13.6 % (ref 11.1–15.7)
WBC: 7.8 10*3/uL (ref 3.9–10.0)

## 2017-04-27 LAB — IRON AND TIBC
%SAT: 19 % — ABNORMAL LOW (ref 21–57)
Iron: 63 ug/dL (ref 41–142)
TIBC: 326 ug/dL (ref 236–444)
UIBC: 263 ug/dL (ref 120–384)

## 2017-04-27 LAB — FERRITIN: Ferritin: 336 ng/mL — ABNORMAL HIGH (ref 9–269)

## 2017-04-27 MED ORDER — SODIUM CHLORIDE 0.9 % IV SOLN
510.0000 mg | Freq: Once | INTRAVENOUS | Status: AC
  Administered 2017-04-27: 510 mg via INTRAVENOUS
  Filled 2017-04-27: qty 17

## 2017-04-27 NOTE — Progress Notes (Signed)
Hematology and Oncology Follow Up Visit  [name] [id number] 03-19-1956 61 y.o. 04/27/2017   Principle Diagnosis:  Recurrent iron deficiency anemia secondary to ulcerative colitis Right cephalic/basilic vein thrombus - positive lupus anticoagulant  Current Therapy:   IV iron as indicated - dose to be given on 03/23/2017 Xarelto 20 mg by mouth daily   Interim History:  [name] is here today for follow-up. She is currently on Xarelto 10 mg daily. She has had an ileal pouchitis and Dr. Havery Moros has her on Cipro and Tindamax daily. She was previously treated with vancomycin for C diff in July. She is taking the Entyvio once every 4 weeks now.  She has noticed some bright red blood in her stool at times and has an anal fissure that is healing. She is putting a medicated cream on this area daily.  She has been symptomatic with fatigue, SOB and palpitations with any exertion.  She is still trying to make herself get up and clean her home and take care of her sweet chickens.  No fever, chills, n/v, cough, rash, dizziness, chest pain or changes in bladder habits.   No swelling or tenderness in her extremities. 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  She has maintained a good appetite and is staying well hydrated.   ECOG Performance Status: 1 - Symptomatic but completely ambulatory  Medications:  Allergies as of 04/27/2017      Reactions   Morphine Other (See Comments)   Gives me crazy dreams   Sulfonamide Derivatives Itching, Swelling, Rash      Medication List       Accurate as of 04/27/17 10:03 AM. Always use your most recent med list.          AMBULATORY NON FORMULARY MEDICATION Medication Name: Nitroglycerin ointment 0.125%- apply rectally TID for anal fissure   BIOTIN PO Take by mouth daily.   calcitRIOL 0.25 MCG capsule Commonly known as:   ROCALTROL Take 0.25 mcg by mouth every other day. Mon, Wed, Fri   Ciprofloxacin-Ciproflox HCl 500 MG tablet Take 1 tablet (500 mg total) by mouth daily.   DULoxetine 60 MG capsule Commonly known as:  CYMBALTA TAKE 2 CAPSULES BY MOUTH DAILY. PATIENT NEEDS EVAL/OR LAB TESTING BEFORE ADDT'L REFILLS ARE GIVEN   febuxostat 40 MG tablet Commonly known as:  ULORIC Take 40 mg by mouth daily.   folic acid 1 MG tablet Commonly known as:  FOLVITE Take 2 tablets (2 mg total) by mouth daily.   HYDROcodone-acetaminophen 5-325 MG tablet Commonly known as:  NORCO/VICODIN Take 2 tablets by mouth every 4 (four) hours as needed for moderate pain or severe pain.   Melatonin 2.5 MG Caps Take 2.5 mg by mouth at bedtime.   metoprolol succinate 50 MG 24 hr tablet Commonly known as:  TOPROL-XL Take 1 tablet (50 mg total) by mouth daily.   MULTIVITAL Chew Chew 1 tablet by mouth daily. Reported on 03/23/2016   ranitidine 150 MG tablet Commonly known as:  ZANTAC TAKE 1 TABLET BY MOUTH TWO  TIMES DAILY   rivaroxaban 10 MG Tabs tablet Commonly known as:  XARELTO Take 1 tablet (10 mg total) by mouth daily with supper.   sodium chloride 0.9 % SOLN 250 mL with vedolizumab 300 MG SOLR 300 mg Inject 300 mg into the vein every 30 (thirty) days.   tinidazole 500 MG  tablet Commonly known as:  TINDAMAX Take 1 tablet (500 mg total) by mouth 2 (two) times daily.       Allergies:  Allergies  Allergen Reactions  . Morphine Other (See Comments)    Gives me crazy dreams  . Sulfonamide Derivatives Itching, Swelling and Rash    Past Medical History, Surgical history, Social history, and Family History were reviewed and updated.  Review of Systems: All other 10 point review of systems is negative.   Physical Exam:  vitals were not taken for this visit.  Wt Readings from Last 3 Encounters:  04/15/17 187 lb (84.8 kg)  03/31/17 187 lb (84.8 kg)  03/23/17 187 lb (84.8 kg)    Ocular: Sclerae  unicteric, pupils equal, round and reactive to light Ear-nose-throat: Oropharynx clear, dentition fair Lymphatic: No cervical, supraclavicular or axillary adenopathy Lungs no rales or rhonchi, good excursion bilaterally Heart regular rate and rhythm, no murmur appreciated Abd soft, nontender, positive bowel sounds, no liver or spleen tip palpated on exam, no fluid wave MSK no focal spinal tenderness, no joint edema Neuro: non-focal, well-oriented, appropriate affect Breasts: Deferred   Lab Results  Component Value Date   WBC 7.8 04/27/2017   HGB 8.3 (L) 04/27/2017   HCT 25.5 (L) 04/27/2017   MCV 115 (H) 04/27/2017   PLT 320 04/27/2017   Lab Results  Component Value Date   FERRITIN 110 03/23/2017   IRON 74 03/18/2017   TIBC 302 01/14/2017   UIBC 230 01/14/2017   IRONPCTSAT 19.2 (L) 03/18/2017   Lab Results  Component Value Date   RETICCTPCT 2.5 01/14/2017   RBC 2.22 (L) 04/27/2017   No results found for: KPAFRELGTCHN, LAMBDASER, KAPLAMBRATIO No results found for: IGGSERUM, IGA, IGMSERUM No results found for: Ronnald Ramp, A1GS, A2GS, Violet Baldy, MSPIKE, SPEI   Chemistry      Component Value Date/Time   NA 136 02/14/2017 1439   K 3.2 (L) 02/14/2017 1439   CL 102 02/14/2017 1439   CO2 26 02/14/2017 1439   BUN 28 (H) 02/14/2017 1439   CREATININE 2.0 (H) 02/14/2017 1439      Component Value Date/Time   CALCIUM 10.1 02/14/2017 1439   CALCIUM 8.9 04/12/2013 0851   ALKPHOS 73 02/14/2017 1439   AST 30 02/14/2017 1439   ALT 28 02/14/2017 1439   BILITOT 0.60 02/14/2017 1439      Impression and Plan: [name] is a very pleasant 61 yo caucasian female with ulcerative colitis and episodes of GI and rectal bleeding. This has caused iron deficiency. She received IV iron twice last month and has not noticed much of a difference.  She is getting over a bout with c diff as well as ileal pouchitis. She is currently on Cipro and Tindamax.  She is symptomatic  at this time with fatigued, SOB and palpitations with over exertion.  Hgb today is 8.3 with an MCV of 115. We will give her IV iron today.  She also has history of superficial thrombus of the right arm which has since resolved. She was positive for the lupus anticoagulant. We have rechecked this today and results are pending.  She has been on Xarelto 10 mg PO daily for the last month and we will go ahead and have her stop this today. She is in agreement with the plan.  We will plan to see her back again in another month for repeat lab work and follow-up.  She will contact our office with any questions or concerns.  We can certainly see her sooner if need be.   Eliezer Bottom, NP 8/22/201810:03 AM

## 2017-04-27 NOTE — Patient Instructions (Signed)

## 2017-04-28 DIAGNOSIS — N183 Chronic kidney disease, stage 3 (moderate): Secondary | ICD-10-CM | POA: Diagnosis not present

## 2017-04-28 DIAGNOSIS — D631 Anemia in chronic kidney disease: Secondary | ICD-10-CM | POA: Diagnosis not present

## 2017-04-28 DIAGNOSIS — I129 Hypertensive chronic kidney disease with stage 1 through stage 4 chronic kidney disease, or unspecified chronic kidney disease: Secondary | ICD-10-CM | POA: Diagnosis not present

## 2017-04-28 DIAGNOSIS — N179 Acute kidney failure, unspecified: Secondary | ICD-10-CM | POA: Diagnosis not present

## 2017-04-28 LAB — RETICULOCYTES: Reticulocyte Count: 4.9 % — ABNORMAL HIGH (ref 0.6–2.6)

## 2017-04-29 LAB — LUPUS ANTICOAGULANT PANEL
PTT-LA: 27.1 s (ref 0.0–51.9)
dRVVT Mix: 45.6 s (ref 0.0–47.0)
dRVVT: 52.5 s — ABNORMAL HIGH (ref 0.0–47.0)

## 2017-05-02 ENCOUNTER — Telehealth: Payer: Self-pay

## 2017-05-02 NOTE — Telephone Encounter (Signed)
Faxed info to: Alden Hipp, [phone], [facility] for continuation of Entyvio, every 4 weeks. Trying to get it approved for home infusions.

## 2017-05-04 ENCOUNTER — Other Ambulatory Visit: Payer: Self-pay

## 2017-05-04 ENCOUNTER — Telehealth: Payer: Self-pay

## 2017-05-04 DIAGNOSIS — K91850 Pouchitis: Secondary | ICD-10-CM

## 2017-05-04 NOTE — Telephone Encounter (Signed)
Patient advised about lab work, repeat CBC in one week. She also wanted to let Dr. Havery Moros know that she is doing better. There are some days now that she does not need to wear a pad. She is having more formed stools now.

## 2017-05-04 NOTE — Telephone Encounter (Signed)
Wonderful, that's great news, I hope this works out for her. I hope she is doing well otherwise. I noticed she had a CBC which showed stable Hgb last week. Not sure if she has another scheduled, I would repeat another CBC in one week. Thanks

## 2017-05-04 NOTE — Telephone Encounter (Signed)
Received approval for Entyvio 300 mg home IV infusions from [facility] for every 30 days. Valid until 09/05/17. Reference #: [id number]. Spoke to [place] at [facility] [phone]), she verified with their pharmacy that everything is all approved and patient is set up for her first home infusion on 05/16/17. Will let [facility] short stay know that patient will no longer be getting her infusions done at their facility.

## 2017-05-04 NOTE — Telephone Encounter (Signed)
Wonderful to hear. Thanks for the update.

## 2017-05-10 ENCOUNTER — Ambulatory Visit (INDEPENDENT_AMBULATORY_CARE_PROVIDER_SITE_OTHER): Payer: Medicare Other | Admitting: Family Medicine

## 2017-05-10 ENCOUNTER — Other Ambulatory Visit: Payer: Self-pay | Admitting: *Deleted

## 2017-05-10 ENCOUNTER — Encounter: Payer: Self-pay | Admitting: Family Medicine

## 2017-05-10 ENCOUNTER — Encounter (HOSPITAL_COMMUNITY): Admission: RE | Admit: 2017-05-10 | Payer: Medicare Other | Source: Ambulatory Visit | Admitting: Gastroenterology

## 2017-05-10 VITALS — BP 132/80 | HR 102 | Temp 97.9°F | Ht 63.0 in | Wt 181.2 lb

## 2017-05-10 DIAGNOSIS — D510 Vitamin B12 deficiency anemia due to intrinsic factor deficiency: Secondary | ICD-10-CM

## 2017-05-10 DIAGNOSIS — D131 Benign neoplasm of stomach: Secondary | ICD-10-CM | POA: Diagnosis not present

## 2017-05-10 DIAGNOSIS — D500 Iron deficiency anemia secondary to blood loss (chronic): Secondary | ICD-10-CM

## 2017-05-10 DIAGNOSIS — F329 Major depressive disorder, single episode, unspecified: Secondary | ICD-10-CM | POA: Diagnosis not present

## 2017-05-10 DIAGNOSIS — Z1159 Encounter for screening for other viral diseases: Secondary | ICD-10-CM

## 2017-05-10 DIAGNOSIS — I82611 Acute embolism and thrombosis of superficial veins of right upper extremity: Secondary | ICD-10-CM

## 2017-05-10 DIAGNOSIS — E785 Hyperlipidemia, unspecified: Secondary | ICD-10-CM | POA: Diagnosis not present

## 2017-05-10 DIAGNOSIS — I10 Essential (primary) hypertension: Secondary | ICD-10-CM

## 2017-05-10 DIAGNOSIS — M10371 Gout due to renal impairment, right ankle and foot: Secondary | ICD-10-CM

## 2017-05-10 DIAGNOSIS — I808 Phlebitis and thrombophlebitis of other sites: Secondary | ICD-10-CM

## 2017-05-10 DIAGNOSIS — M109 Gout, unspecified: Secondary | ICD-10-CM | POA: Diagnosis not present

## 2017-05-10 DIAGNOSIS — Z79891 Long term (current) use of opiate analgesic: Secondary | ICD-10-CM | POA: Diagnosis not present

## 2017-05-10 DIAGNOSIS — F32A Depression, unspecified: Secondary | ICD-10-CM

## 2017-05-10 LAB — COMPREHENSIVE METABOLIC PANEL
ALT: 20 U/L (ref 0–35)
AST: 23 U/L (ref 0–37)
Albumin: 4.1 g/dL (ref 3.5–5.2)
Alkaline Phosphatase: 63 U/L (ref 39–117)
BUN: 35 mg/dL — ABNORMAL HIGH (ref 6–23)
CO2: 23 mEq/L (ref 19–32)
Calcium: 9.7 mg/dL (ref 8.4–10.5)
Chloride: 103 mEq/L (ref 96–112)
Creatinine, Ser: 2.27 mg/dL — ABNORMAL HIGH (ref 0.40–1.20)
GFR: 23.08 mL/min — ABNORMAL LOW (ref >60.00)
Glucose, Bld: 108 mg/dL — ABNORMAL HIGH (ref 70–99)
Potassium: 3.9 mEq/L (ref 3.5–5.1)
Sodium: 135 mEq/L (ref 135–145)
Total Bilirubin: 0.3 mg/dL (ref 0.2–1.2)
Total Protein: 7.3 g/dL (ref 6.0–8.3)

## 2017-05-10 LAB — LIPID PANEL
Cholesterol: 247 mg/dL — ABNORMAL HIGH (ref 0–200)
HDL: 45.7 mg/dL (ref >39.00)
NonHDL: 201
Total CHOL/HDL Ratio: 5
Triglycerides: 307 mg/dL — ABNORMAL HIGH (ref 0.0–149.0)
VLDL: 61.4 mg/dL — ABNORMAL HIGH (ref 0.0–40.0)

## 2017-05-10 LAB — LDL CHOLESTEROL, DIRECT: Direct LDL: 127 mg/dL

## 2017-05-10 MED ORDER — DULOXETINE HCL 60 MG PO CPEP: ORAL_CAPSULE | ORAL | 0 refills | Status: DC

## 2017-05-10 MED ORDER — FEBUXOSTAT 40 MG PO TABS: 40.0000 mg | ORAL_TABLET | Freq: Every day | ORAL | Status: DC

## 2017-05-10 MED ORDER — FOLIC ACID 1 MG PO TABS: 2.0000 mg | ORAL_TABLET | Freq: Every day | ORAL | 3 refills | Status: DC

## 2017-05-10 NOTE — Assessment & Plan Note (Signed)
Well controlled, no changes to meds. Encouraged heart healthy diet such as the DASH diet and exercise as tolerated.  °

## 2017-05-10 NOTE — Progress Notes (Signed)
Patient ID: [name], female    DOB: 08/05/1956  Age: 61 y.o. MRN: [id number]    Subjective:  Subjective  HPI [name] presents for f/u bp , gout and depression.  She has no complaints.  She has been seeing Dr Havery Moros for C diff and ulcerative colitis.  She is take vedolizumab 300 mg IV monthly.  They are going to start to give it to her at home.  She is doing better.   Review of Systems  Constitutional: Negative for activity change, appetite change, fatigue and unexpected weight change.  Respiratory: Negative for cough and shortness of breath.   Cardiovascular: Negative for chest pain and palpitations.  Psychiatric/Behavioral: Negative for behavioral problems and dysphoric mood. The patient is not nervous/anxious.     History Past Medical History:  Diagnosis Date  . Abdominal pain    Hx  . Allergy   . Anal stenosis   . Anemia   . Anxiety   . Arthritis   . Asthma    patient does not have inhaler  . Blood in stool    Hx  . Blood in urine    Hx  . Blood transfusion without reported diagnosis   . De Quervain's tenosynovitis   . Depression   . Difficulty urinating    Hx  . Easy bruising   . Esophagitis   . Fainting    History - resolved - due to dehydration  . Fatigue    Hx  . Fibroid    Hx  . Gastric polyp   . GERD (gastroesophageal reflux disease)   . 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 Hemorrhoids, external   . Hemorrhoids, internal   . Herpes, genital    vaginal treated 07/05/12 and pt states is resolved  . History of cervical dysplasia   . History of small bowel obstruction   . Hyperlipidemia    currently no meds  . Hyperparathyroidism   . Hypertension   . Hypokalemia    Hx  . 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 Obesity   . Ovarian cyst   . Poor dental hygiene   . Pulmonary nodule, right    right upper lobe  . Rectal  bleeding    Hx  . Rectal pain    Hx  . Renal insufficiency    CKD - stage 2  . RLS (restless legs syndrome)    no meds  . Tooth infection 11/2016   right low  . Ulcerative colitis   . Visual disturbance    wears glasses  . Weakness generalized    Hx - patient denies generalized weakness  . Wears dentures    upper only    She has a past surgical history that includes Anal dilation; Restorative proctocolectomy; Ileostomy closure; Cholecystectomy; Tubal ligation; Hemorrhoid surgery; fatty tumor removed from back; Total abdominal hysterectomy (1998); Cervical biopsy w/ loop electrode excision; Upper gastrointestinal endoscopy; Colonoscopy; and Sigmoidoscopy.   Her family history includes Cancer in her maternal uncle and sister; Diabetes in her sister; Heart attack in her father; Heart disease in her mother; Hypertension in her father and mother; Irritable bowel syndrome in her unknown relative; Ulcerative colitis in her daughter and father.She reports that she quit smoking about 37 years ago. Her smoking use included Cigarettes. She has a 4.00 pack-year smoking history.  She has never used smokeless tobacco. She reports that she does not drink alcohol or use drugs.  Current Outpatient Prescriptions on File Prior to Visit  Medication Sig Dispense Refill  . AMBULATORY NON FORMULARY MEDICATION Medication Name: Nitroglycerin ointment 0.125%- apply rectally TID for anal fissure 30 g 0  . BIOTIN PO Take by mouth daily.    . calcitRIOL (ROCALTROL) 0.25 MCG capsule Take 0.25 mcg by mouth every other day. Mon, Wed, Fri    . HYDROcodone-acetaminophen (NORCO/VICODIN) 5-325 MG tablet Take 2 tablets by mouth every 4 (four) hours as needed for moderate pain or severe pain. 90 tablet 0  . Melatonin 2.5 MG CAPS Take 2.5 mg by mouth at bedtime.     . metoprolol succinate (TOPROL-XL) 50 MG 24 hr tablet Take 1 tablet (50 mg total) by mouth daily. 90 tablet 3  . Multiple Vitamins-Minerals (MULTIVITAL) CHEW  Chew 1 tablet by mouth daily. Reported on 03/23/2016    . ranitidine (ZANTAC) 150 MG tablet TAKE 1 TABLET BY MOUTH TWO  TIMES DAILY 180 tablet 0  . sodium chloride 0.9 % SOLN 250 mL with vedolizumab 300 MG SOLR 300 mg Inject 300 mg into the vein every 30 (thirty) days. 1 Dose 12   Current Facility-Administered Medications on File Prior to Visit  Medication Dose Route Frequency Provider Last Rate Last Dose  . 0.9 %  sodium chloride infusion  500 mL Intravenous Continuous Armbruster, Carlota Raspberry, MD         Objective:  Objective  Physical Exam  Constitutional: She is oriented to person, place, and time. She appears well-developed and well-nourished. No distress.  HENT:  Head: Normocephalic and atraumatic.  Nose: Nose normal.  Eyes: Right eye exhibits no discharge. Left eye exhibits no discharge.  Neck: Normal range of motion. Neck supple.  Cardiovascular: Normal rate and regular rhythm.   No murmur heard. Pulmonary/Chest: Effort normal and breath sounds normal.  Abdominal: Soft. Bowel sounds are normal. There is no tenderness.    Musculoskeletal: She exhibits no edema.  Neurological: She is alert and oriented to person, place, and time.  Skin: Skin is warm and dry.  Psychiatric: She has a normal mood and affect.  Nursing note and vitals reviewed.  BP 132/80 (BP Location: Right Arm, Patient Position: Sitting, Cuff Size: Normal)   Pulse (!) 102   Temp 97.9 F (36.6 C) (Oral)   Ht 5' 3"  (1.6 m)   Wt 181 lb 3.2 oz (82.2 kg)   SpO2 98%   BMI 32.10 kg/m  Wt Readings from Last 3 Encounters:  05/10/17 181 lb 3.2 oz (82.2 kg)  04/27/17 183 lb (83 kg)  04/15/17 187 lb (84.8 kg)     Lab Results  Component Value Date   WBC 7.8 04/27/2017   HGB 8.3 (L) 04/27/2017   HCT 25.5 (L) 04/27/2017   PLT 320 04/27/2017   GLUCOSE 108 (H) 05/10/2017   CHOL 247 (H) 05/10/2017   TRIG 307.0 (H) 05/10/2017   HDL 45.70 05/10/2017   LDLDIRECT 127.0 05/10/2017   LDLCALC 122 (H) 11/22/2014   ALT  20 05/10/2017   AST 23 05/10/2017   NA 135 05/10/2017   K 3.9 05/10/2017   CL 103 05/10/2017   CREATININE 2.27 (H) 05/10/2017   BUN 35 (H) 05/10/2017   CO2 23 05/10/2017   TSH 2.624 01/15/2017    No results found.   Assessment & Plan:  Plan  I have discontinued Ms. Voller's rivaroxaban, tinidazole, and Ciprofloxacin-Ciproflox HCl. I  have also changed her febuxostat. Additionally, I am having her maintain her MULTIVITAL, calcitRIOL, BIOTIN PO, Melatonin, metoprolol succinate, HYDROcodone-acetaminophen, AMBULATORY NON FORMULARY MEDICATION, ranitidine, sodium chloride 0.9 % SOLN 250 mL with vedolizumab 300 MG SOLR 300 mg, and DULoxetine. We will continue to administer sodium chloride.  Meds ordered this encounter  Medications  . febuxostat (ULORIC) 40 MG tablet    Sig: Take 1 tablet (40 mg total) by mouth daily.  . DULoxetine (CYMBALTA) 60 MG capsule    Sig: TAKE 2 CAPSULES BY MOUTH DAILY. PATIENT NEEDS EVAL/OR LAB TESTING BEFORE ADDT'L REFILLS ARE GIVEN    Dispense:  180 capsule    Refill:  0    Problem List Items Addressed This Visit      Unprioritized   GASTRIC POLYP   ANEMIA   Acute basilic vein thrombosis, right   Benign essential HTN    Well controlled, no changes to meds. Encouraged heart healthy diet such as the DASH diet and exercise as tolerated.       Relevant Orders   Lipid panel (Completed)   Comprehensive metabolic panel (Completed)   Depression    Stable con't cymbalta      Relevant Medications   DULoxetine (CYMBALTA) 60 MG capsule   Hyperlipidemia LDL goal <100    Tolerating statin, encouraged heart healthy diet, avoid trans fats, minimize simple carbs and saturated fats. Increase exercise as tolerated      Relevant Orders   Lipid panel (Completed)   Comprehensive metabolic panel (Completed)   LDL cholesterol, direct (Completed)    Other Visit Diagnoses    Need for hepatitis C screening test    -  Primary   Relevant Orders   Hepatitis C antibody     Pernicious anemia       Gout, unspecified cause, unspecified chronicity, unspecified site       Relevant Medications   febuxostat (ULORIC) 40 MG tablet      Follow-up: Return in about 6 months (around 11/07/2017) for annual exam, fasting.  Ann Held, DO

## 2017-05-10 NOTE — Assessment & Plan Note (Signed)
Stable con't cymbalta

## 2017-05-10 NOTE — Assessment & Plan Note (Signed)
con't meds Recheck labs

## 2017-05-10 NOTE — Assessment & Plan Note (Signed)
Tolerating statin, encouraged heart healthy diet, avoid trans fats, minimize simple carbs and saturated fats. Increase exercise as tolerated 

## 2017-05-10 NOTE — Patient Instructions (Signed)

## 2017-05-11 LAB — HEPATITIS C ANTIBODY: HCV Ab: NONREACTIVE

## 2017-05-13 ENCOUNTER — Ambulatory Visit (HOSPITAL_COMMUNITY): Admission: RE | Admit: 2017-05-13 | Payer: Medicare Other | Source: Ambulatory Visit

## 2017-05-13 ENCOUNTER — Telehealth: Payer: Self-pay | Admitting: Gastroenterology

## 2017-05-13 ENCOUNTER — Other Ambulatory Visit: Payer: Self-pay | Admitting: Family Medicine

## 2017-05-13 DIAGNOSIS — E1169 Type 2 diabetes mellitus with other specified complication: Secondary | ICD-10-CM

## 2017-05-13 DIAGNOSIS — E785 Hyperlipidemia, unspecified: Secondary | ICD-10-CM

## 2017-05-13 MED ORDER — TINIDAZOLE 500 MG PO TABS: 500.0000 mg | ORAL_TABLET | Freq: Two times a day (BID) | ORAL | 0 refills | Status: DC

## 2017-05-13 NOTE — Telephone Encounter (Signed)
Patient states she is returning phone call to Almyra Free best call back # is [phone].

## 2017-05-13 NOTE — Telephone Encounter (Signed)
Routed to DOD, patient of Dr. Havery Moros, patient states that started having loose stools today, her next infusion of Entyvio is scheduled for Monday, 05/16/17. Please advise.

## 2017-05-13 NOTE — Addendum Note (Signed)
Addended by: Gatha Mayer on: 05/13/2017 05:24 PM   Modules accepted: Orders

## 2017-05-13 NOTE — Telephone Encounter (Addendum)
Spoke to her - took cipro and tinidimax x 1 month did ok Stopped 2 weeks ago  Watery leaky diarrhea since yesterday  + C diff 7/22  Vancomycin rx early July - she does not remember   Not sure if this is C diff - ? Colonized vs IBD vs C diff dz  Try tindazole again for now Told her we would call her Mon and she should call back if doesn't hear from [place]

## 2017-05-13 NOTE — Telephone Encounter (Signed)
Left message to have patient call back. She is due to have her first home infusion of Entyvio on Monday, 9/10.

## 2017-05-16 ENCOUNTER — Other Ambulatory Visit: Payer: Self-pay

## 2017-05-16 DIAGNOSIS — R262 Difficulty in walking, not elsewhere classified: Secondary | ICD-10-CM | POA: Diagnosis not present

## 2017-05-16 DIAGNOSIS — K5190 Ulcerative colitis, unspecified, without complications: Secondary | ICD-10-CM | POA: Diagnosis not present

## 2017-05-16 MED ORDER — TINIDAZOLE 500 MG PO TABS: 500.0000 mg | ORAL_TABLET | Freq: Two times a day (BID) | ORAL | 0 refills | Status: AC

## 2017-05-16 NOTE — Telephone Encounter (Signed)
Yes we may need to keep her on chronic antibiotics for chronic refractory pouchitis. Why don't we keep her on tinidazole monotherapy and see how she does for the next month. We can try taking her off after another month and see how she does. Can you let her know? Thanks

## 2017-05-16 NOTE — Telephone Encounter (Signed)
Spoke to patient, will send in months supply of tinidazole.

## 2017-05-16 NOTE — Telephone Encounter (Signed)
Thanks Glendell Docker for your assistance. I agree with your plan. Almyra Free can you call Mrs. Huether and see how she is doing?

## 2017-05-16 NOTE — Telephone Encounter (Signed)
Patient states she is doing better, not having the uncontrollable diarrhea. She wants to discuss with Dr. Havery Moros whether or not she should be on the antibiotic for a longer duration of time. Currently the nurse is at her home to start her Entyvio infusion.

## 2017-05-21 ENCOUNTER — Other Ambulatory Visit: Payer: Self-pay | Admitting: Hematology & Oncology

## 2017-05-21 ENCOUNTER — Other Ambulatory Visit: Payer: Self-pay | Admitting: Family Medicine

## 2017-05-21 DIAGNOSIS — I808 Phlebitis and thrombophlebitis of other sites: Secondary | ICD-10-CM

## 2017-05-21 DIAGNOSIS — F32A Depression, unspecified: Secondary | ICD-10-CM

## 2017-05-21 DIAGNOSIS — D500 Iron deficiency anemia secondary to blood loss (chronic): Secondary | ICD-10-CM

## 2017-05-21 DIAGNOSIS — F329 Major depressive disorder, single episode, unspecified: Secondary | ICD-10-CM

## 2017-05-23 NOTE — Telephone Encounter (Signed)
On 9.4.18 #[id number] was faxed to [facility]/thx dmf

## 2017-05-26 ENCOUNTER — Other Ambulatory Visit (HOSPITAL_BASED_OUTPATIENT_CLINIC_OR_DEPARTMENT_OTHER): Payer: Medicare Other

## 2017-05-26 ENCOUNTER — Ambulatory Visit (HOSPITAL_BASED_OUTPATIENT_CLINIC_OR_DEPARTMENT_OTHER): Payer: Medicare Other | Admitting: Hematology & Oncology

## 2017-05-26 DIAGNOSIS — D649 Anemia, unspecified: Secondary | ICD-10-CM | POA: Diagnosis not present

## 2017-05-26 DIAGNOSIS — K625 Hemorrhage of anus and rectum: Secondary | ICD-10-CM | POA: Diagnosis not present

## 2017-05-26 DIAGNOSIS — K922 Gastrointestinal hemorrhage, unspecified: Secondary | ICD-10-CM | POA: Diagnosis not present

## 2017-05-26 DIAGNOSIS — I82611 Acute embolism and thrombosis of superficial veins of right upper extremity: Secondary | ICD-10-CM | POA: Diagnosis not present

## 2017-05-26 DIAGNOSIS — D500 Iron deficiency anemia secondary to blood loss (chronic): Secondary | ICD-10-CM

## 2017-05-26 DIAGNOSIS — I808 Phlebitis and thrombophlebitis of other sites: Secondary | ICD-10-CM

## 2017-05-26 DIAGNOSIS — K5190 Ulcerative colitis, unspecified, without complications: Secondary | ICD-10-CM

## 2017-05-26 LAB — FERRITIN: Ferritin: 354 ng/ml — ABNORMAL HIGH (ref 9–269)

## 2017-05-26 LAB — IRON AND TIBC
%SAT: 18 % — ABNORMAL LOW (ref 21–57)
Iron: 55 ug/dL (ref 41–142)
TIBC: 299 ug/dL (ref 236–444)
UIBC: 244 ug/dL (ref 120–384)

## 2017-05-26 LAB — CBC WITH DIFFERENTIAL (CANCER CENTER ONLY)
BASO#: 0 10*3/uL (ref 0.0–0.2)
BASO%: 0.2 % (ref 0.0–2.0)
EOS%: 3 % (ref 0.0–7.0)
Eosinophils Absolute: 0.3 10*3/uL (ref 0.0–0.5)
HCT: 28.7 % — ABNORMAL LOW (ref 34.8–46.6)
HGB: 9.6 g/dL — ABNORMAL LOW (ref 11.6–15.9)
LYMPH#: 2.9 10*3/uL (ref 0.9–3.3)
LYMPH%: 35.5 % (ref 14.0–48.0)
MCH: 36.5 pg — ABNORMAL HIGH (ref 26.0–34.0)
MCHC: 33.4 g/dL (ref 32.0–36.0)
MCV: 109 fL — ABNORMAL HIGH (ref 81–101)
MONO#: 0.7 10*3/uL (ref 0.1–0.9)
MONO%: 8.4 % (ref 0.0–13.0)
NEUT#: 4.3 10*3/uL (ref 1.5–6.5)
NEUT%: 52.9 % (ref 39.6–80.0)
Platelets: 307 10*3/uL (ref 145–400)
RBC: 2.63 10*6/uL — ABNORMAL LOW (ref 3.70–5.32)
RDW: 12.7 % (ref 11.1–15.7)
WBC: 8.2 10*3/uL (ref 3.9–10.0)

## 2017-05-26 MED ORDER — FOLIC ACID 1 MG PO TABS: 2.0000 mg | ORAL_TABLET | Freq: Every day | ORAL | 3 refills | Status: DC

## 2017-05-26 NOTE — Progress Notes (Signed)
Hematology and Oncology Follow Up Visit  [name] [id number] February 04, 1956 61 y.o. 05/26/2017   Principle Diagnosis:  Recurrent iron deficiency anemia secondary to ulcerative colitis Right cephalic/basilic vein thrombus - positive lupus anticoagulant  Current Therapy:   IV iron as indicated - dose to be given on 03/23/2017    Interim History:  [name] is here today for follow-up.  She is doing better Sh does not appear to be having GI bleeding She is taking the Entyvio monthly.   I think she last got iron back in August. At that time her ferritin was 336 with an iron saturation of 19%.  She now is off Xarelto. She been on it for several month.  Her last lupus anticoagulant test was negative.   She has had no feve. There's bee no cough. She's had no nausea or vomiting. She's had no leg swelling. She's ad no rashes.   overall, her performance status is ECOG 1.ECOG Performance Status: 1 - Symptomatic but completely ambulatory  Medications:  Allergies as of 05/26/2017      Reactions   Morphine Other (See Comments)   Gives me crazy dreams   Sulfonamide Derivatives Itching, Swelling, Rash      Medication List       Accurate as of 05/26/17  8:13 AM. Always use your most recent med list.          AMBULATORY NON FORMULARY MEDICATION Medication Name: Nitroglycerin ointment 0.125%- apply rectally TID for anal fissure   BIOTIN PO Take by mouth daily.   calcitRIOL 0.25 MCG capsule Commonly known as:  ROCALTROL Take 0.25 mcg by mouth every other day. Mon, Wed, Fri   DULoxetine 60 MG capsule Commonly known as:  CYMBALTA TAKE 2 CAPSULES BY MOUTH DAILY. PATIENT NEEDS EVAL/OR LAB TESTING BEFORE ADDT'L REFILLS ARE GIVEN   febuxostat 40 MG tablet Commonly known as:  ULORIC Take 1 tablet (40 mg total) by mouth daily.   folic acid 1 MG tablet Commonly known as:  FOLVITE Take 2 tablets (2 mg total) by mouth daily.   HYDROcodone-acetaminophen 5-325 MG tablet Commonly  known as:  NORCO/VICODIN Take 2 tablets by mouth every 4 (four) hours as needed for moderate pain or severe pain.   Melatonin 2.5 MG Caps Take 2.5 mg by mouth at bedtime.   metoprolol succinate 50 MG 24 hr tablet Commonly known as:  TOPROL-XL Take 1 tablet (50 mg total) by mouth daily.   MULTIVITAL Chew Chew 1 tablet by mouth daily. Reported on 03/23/2016   ranitidine 150 MG tablet Commonly known as:  ZANTAC TAKE 1 TABLET BY MOUTH TWO  TIMES DAILY   sodium chloride 0.9 % SOLN 250 mL with vedolizumab 300 MG SOLR 300 mg Inject 300 mg into the vein every 30 (thirty) days.   tinidazole 500 MG tablet Commonly known as:  TINDAMAX Take 1 tablet (500 mg total) by mouth 2 (two) times daily.   XARELTO 10 MG Tabs tablet Generic drug:  rivaroxaban TAKE 1 TABLET (10 MG TOTAL) BY MOUTH DAILY WITH SUPPER.       Allergies:  Allergies  Allergen Reactions  . Morphine Other (See Comments)    Gives me crazy dreams  . Sulfonamide Derivatives Itching, Swelling and Rash    Past Medical History, Surgical history, Social history, and Family History were reviewed and updated.  Review of Systems:  as stated in the interim history  Physical Exam:  vitals were not taken for this visit.  Wt Readings from  Last 3 Encounters:  05/10/17 181 lb 3.2 oz (82.2 kg)  04/27/17 183 lb (83 kg)  04/15/17 187 lb (84.8 kg)    Physical Exam  Constitutional: She is oriented to person, place, and time.  HENT:  Head: Normocephalic and atraumatic.  Mouth/Throat: Oropharynx is clear and moist.  Eyes: Pupils are equal, round, and reactive to light. EOM are normal.  Neck: Normal range of motion.  Cardiovascular: Normal rate, regular rhythm and normal heart sounds.   Pulmonary/Chest: Effort normal and breath sounds normal.  Abdominal: Soft. Bowel sounds are normal.  Musculoskeletal: Normal range of motion. She exhibits no edema, tenderness or deformity.  Lymphadenopathy:    She has no cervical adenopathy.    Neurological: She is alert and oriented to person, place, and time.  Skin: Skin is warm and dry. No rash noted. No erythema.  Psychiatric: She has a normal mood and affect. Her behavior is normal. Judgment and thought content normal.  Vitals reviewed.    Lab Results  Component Value Date   WBC 8.2 05/26/2017   HGB 9.6 (L) 05/26/2017   HCT 28.7 (L) 05/26/2017   MCV 109 (H) 05/26/2017   PLT 307 05/26/2017   Lab Results  Component Value Date   FERRITIN 336 (H) 04/27/2017   IRON 63 04/27/2017   TIBC 326 04/27/2017   UIBC 263 04/27/2017   IRONPCTSAT 19 (L) 04/27/2017   Lab Results  Component Value Date   RETICCTPCT 2.5 01/14/2017   RBC 2.63 (L) 05/26/2017   No results found for: KPAFRELGTCHN, LAMBDASER, KAPLAMBRATIO No results found for: IGGSERUM, IGA, IGMSERUM No results found for: Odetta Pink, SPEI   Chemistry      Component Value Date/Time   NA 135 05/10/2017 0951   NA 136 02/14/2017 1439   K 3.9 05/10/2017 0951   K 3.2 (L) 02/14/2017 1439   CL 103 05/10/2017 0951   CL 102 02/14/2017 1439   CO2 23 05/10/2017 0951   CO2 26 02/14/2017 1439   BUN 35 (H) 05/10/2017 0951   BUN 28 (H) 02/14/2017 1439   CREATININE 2.27 (H) 05/10/2017 0951   CREATININE 2.0 (H) 02/14/2017 1439      Component Value Date/Time   CALCIUM 9.7 05/10/2017 0951   CALCIUM 10.1 02/14/2017 1439   CALCIUM 8.9 04/12/2013 0851   ALKPHOS 63 05/10/2017 0951   ALKPHOS 73 02/14/2017 1439   AST 23 05/10/2017 0951   AST 30 02/14/2017 1439   ALT 20 05/10/2017 0951   ALT 28 02/14/2017 1439   BILITOT 0.3 05/10/2017 0951   BILITOT 0.60 02/14/2017 1439      Impression and Plan: [name] is a very pleasant 61 yo caucasian female with ulcerative colitis and episodes of GI and rectal bleeding. This has caused iron deficiency.    We will see what her iron studies are.  Hopefully now that she is off Xarelto, se we will not have bleeding problems.    she is supposed to be taking folic acid.  She said that she has not received this yet.  We will  come back  to see [place] in another 6 weeks.     [name], MD 9/20/20188:13 AM

## 2017-05-27 LAB — LUPUS ANTICOAGULANT PANEL
PTT-LA: 25.6 s (ref 0.0–51.9)
dRVVT: 30 s (ref 0.0–47.0)

## 2017-05-27 LAB — RETICULOCYTES: Reticulocyte Count: 2.9 % — ABNORMAL HIGH (ref 0.6–2.6)

## 2017-05-27 NOTE — Progress Notes (Signed)
Subjective:   [name] is a 61 y.o. female who presents for Medicare Annual (Subsequent) preventive examination.  Review of Systems:  No ROS.  Medicare Wellness Visit. Additional risk factors are reflected in the social history.  Cardiac Risk Factors include: advanced age (>11men, >51 women);dyslipidemia;hypertension;sedentary lifestyle Sleep patterns: states her sleep varies. If she doesn't sleep enough at night, she naps during the day.  Home Safety/Smoke Alarms: Feels safe in home. Smoke alarms in place.  Living environment; residence and Firearm Safety: Lives with boyfriend. Seat Belt Safety/Bike Helmet: Wears seat belt.   Female:   Pap- Follows with Dr.Fontaine. Has appt Nov 2018       Mammo- last 12/10/16       Dexa scan- pt declines CCS- last reported 2018 w/ Dr.Armbruster    Objective:     Vitals: BP 133/87 (BP Location: Left Arm, Patient Position: Sitting, Cuff Size: Normal)   Pulse 98   Ht 5\' 3"  (1.6 m)   Wt 181 lb (82.1 kg)   SpO2 98%   BMI 32.06 kg/m   Body mass index is 32.06 kg/m.   Tobacco History  Smoking Status  . Former Smoker  . Packs/day: 1.00  . Years: 4.00  . Types: Cigarettes  . Quit date: 05/21/1979  Smokeless Tobacco  . Never Used     Counseling given: Not Answered   Past Medical History:  Diagnosis Date  . Abdominal pain    Hx  . Allergy   . Anal stenosis   . Anemia   . Anxiety   . Arthritis   . Asthma    patient does not have inhaler  . Blood in stool    Hx  . Blood in urine    Hx  . Blood transfusion without reported diagnosis   . De Quervain's tenosynovitis   . Depression   . Difficulty urinating    Hx  . Easy bruising   . Esophagitis   . Fainting    History - resolved - due to dehydration  . Fatigue    Hx  . Fibroid    Hx  . Gastric polyp   . GERD (gastroesophageal reflux disease)   . 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 Hemorrhoids, external   . Hemorrhoids, internal   . Herpes,  genital    vaginal treated 07/05/12 and pt states is resolved  . History of cervical dysplasia   . History of small bowel obstruction   . Hyperlipidemia    currently no meds  . Hyperparathyroidism   . Hypertension   . Hypokalemia    Hx  . 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 Obesity   . Ovarian cyst   . Poor dental hygiene   . Pulmonary nodule, right    right upper lobe  . Rectal bleeding    Hx  . Rectal pain    Hx  . Renal insufficiency    CKD - stage 2  . RLS (restless legs syndrome)    no meds  . Tooth infection 11/2016   right low  . Ulcerative colitis   . Visual disturbance    wears glasses  . Weakness generalized    Hx - patient denies generalized weakness  . Wears dentures    upper only   Past Surgical History:  Procedure Laterality Date  .  ANAL DILATION    . CERVICAL BIOPSY  W/ LOOP ELECTRODE EXCISION    . CHOLECYSTECTOMY    . COLONOSCOPY     [name]  . fatty tumor removed from back     X 2  . HEMORRHOID SURGERY    . ILEOSTOMY CLOSURE    . RESTORATIVE PROCTOCOLECTOMY     with insertion of ileoanal J Pouch with loop ileostomy  . SIGMOIDOSCOPY    . TOTAL ABDOMINAL HYSTERECTOMY  1998   TAH/LSO  . TUBAL LIGATION    . UPPER GASTROINTESTINAL ENDOSCOPY     [name]   Family History  Problem Relation Age of Onset  . Ulcerative colitis Father   . Hypertension Father   . Heart attack Father   . Hypertension Mother   . Heart disease Mother        s/p pci  . Ulcerative colitis Daughter   . Irritable bowel syndrome Unknown        grandchildren  . Diabetes Sister   . Cancer Sister        uterine  . Cancer Maternal Uncle        LUNG  . Colon cancer Neg Hx   . Esophageal cancer Neg Hx   . Stomach cancer Neg Hx   . Rectal cancer Neg Hx    History  Sexual Activity  . Sexual activity: Yes  . Birth control/ protection: Post-menopausal    Outpatient Encounter  Prescriptions as of 06/02/2017  Medication Sig  . BIOTIN PO Take by mouth daily.  . calcitRIOL (ROCALTROL) 0.25 MCG capsule Take 0.25 mcg by mouth every other day. Mon, Wed, Fri  . DULoxetine (CYMBALTA) 60 MG capsule TAKE 2 CAPSULES BY MOUTH DAILY. PATIENT NEEDS EVAL/OR LAB TESTING BEFORE ADDT'L REFILLS ARE GIVEN  . febuxostat (ULORIC) 40 MG tablet Take 1 tablet (40 mg total) by mouth daily.  . folic acid (FOLVITE) 1 MG tablet Take 2 tablets (2 mg total) by mouth daily.  Marland Kitchen HYDROcodone-acetaminophen (NORCO/VICODIN) 5-325 MG tablet Take 2 tablets by mouth every 4 (four) hours as needed for moderate pain or severe pain.  . metoprolol succinate (TOPROL-XL) 50 MG 24 hr tablet Take 1 tablet (50 mg total) by mouth daily.  . Multiple Vitamins-Minerals (MULTIVITAL) CHEW Chew 1 tablet by mouth daily. Reported on 03/23/2016  . ranitidine (ZANTAC) 150 MG tablet TAKE 1 TABLET BY MOUTH TWO  TIMES DAILY  . sodium chloride 0.9 % SOLN 250 mL with vedolizumab 300 MG SOLR 300 mg Inject 300 mg into the vein every 30 (thirty) days.  Marland Kitchen tinidazole (TINDAMAX) 500 MG tablet Take 1 tablet (500 mg total) by mouth 2 (two) times daily.  . AMBULATORY NON FORMULARY MEDICATION Medication Name: Nitroglycerin ointment 0.125%- apply rectally TID for anal fissure (Patient not taking: Reported on 06/02/2017)  . Melatonin 2.5 MG CAPS Take 2.5 mg by mouth at bedtime.    No facility-administered encounter medications on file as of 06/02/2017.     Activities of Daily Living In your present state of health, do you have any difficulty performing the following activities: 06/02/2017 04/15/2017  Hearing? N N  Vision? N N  Comment wears glasses. Dr.Shapiro yearly. -  Difficulty concentrating or making decisions? Y N  Walking or climbing stairs? N N  Dressing or bathing? N N  Doing errands, shopping? N -  Preparing Food and eating ? N -  Using the Toilet? N -  In the past six months, have you accidently leaked urine? N -  Do  you have  problems with loss of bowel control? Y -  Comment Since 1998. See surgery hx -  Managing your Medications? N -  Managing your Finances? N -  Housekeeping or managing your Housekeeping? N -  Some recent data might be hidden    Patient Care Team: Carollee Herter, Alferd Apa, DO as PCP - General Donato Heinz, MD (Nephrology) Armbruster, Carlota Raspberry, MD as Consulting Physician (Gastroenterology) Terrance Mass, MD as Consulting Physician (Gynecology) Rutherford Guys, MD as Consulting Physician (Ophthalmology) Nahser, Wonda Cheng, MD as Consulting Physician (Cardiology)    Assessment:    Physical assessment deferred to PCP.  Exercise Activities and Dietary recommendations Current Exercise Habits: The patient does not participate in regular exercise at present, Exercise limited by: None identified Diet (meal preparation, eat out, water intake, caffeinated beverages, dairy products, fruits and vegetables): in general, an "unhealthy" diet   Goals      Patient Stated   . Lose 20 lbs by next year.   (pt-stated)          Increase physical activity-walking       Fall Risk Fall Risk  06/02/2017 03/30/2017 11/11/2015 08/18/2015  Falls in the past year? Yes Yes No Yes  Number falls in past yr: 1 1 - 2 or more  Injury with Fall? No No - No  Risk Factor Category  - - - High Fall Risk  Risk for fall due to : - - - Impaired balance/gait  Follow up Education provided Falls prevention discussed - Education provided;Falls prevention discussed   Depression Screen PHQ 2/9 Scores 06/02/2017 05/10/2017 07/27/2016 11/11/2015  PHQ - 2 Score 0 0 0 0     Cognitive Function MMSE - Mini Mental State Exam 06/02/2017  Orientation to time 5  Orientation to Place 5  Registration 3  Attention/ Calculation 5  Recall 3  Language- name 2 objects 2  Language- repeat 1  Language- follow 3 step command 3  Language- read & follow direction 1  Write a sentence 1  Copy design 1  Total score 30         Immunization History  Administered Date(s) Administered  . Influenza Split 08/03/2011  . Influenza Whole 08/15/2007, 07/05/2008, 07/29/2009, 08/19/2010, 08/19/2012  . Influenza,inj,Quad PF,6+ Mos 07/23/2013, 06/12/2014, 06/02/2015  . Influenza-Unspecified 06/02/2016  . PPD Test 06/07/2011, 06/13/2012, 09/14/2013  . Pneumococcal Polysaccharide-23 07/23/2013  . Tdap 09/13/2011   Screening Tests Health Maintenance  Topic Date Due  . HEMOGLOBIN A1C  1956/01/16  . FOOT EXAM  12/15/1963  . OPHTHALMOLOGY EXAM  12/15/1963  . URINE MICROALBUMIN  12/15/1963  . HIV Screening  12/14/1968  . COLONOSCOPY  06/02/2016  . INFLUENZA VACCINE  04/06/2017  . MAMMOGRAM  12/10/2017  . PNEUMOCOCCAL POLYSACCHARIDE VACCINE (2) 07/23/2018  . TETANUS/TDAP  09/12/2021  . Hepatitis C Screening  Completed      Plan:   Follow up with PCP as directed  Continue to eat heart healthy diet (full of fruits, vegetables, whole grains, lean protein, water--limit salt, fat, and sugar intake) and increase physical activity as tolerated.  Continue doing brain stimulating activities (puzzles, reading, adult coloring books, staying active) to keep memory sharp.     I have personally reviewed and noted the following in the patient's chart:   . Medical and social history . Use of alcohol, tobacco or illicit drugs  . Current medications and supplements . Functional ability and status . Nutritional status . Physical activity . Advanced directives . List of other  physicians . Hospitalizations, surgeries, and ER visits in previous 12 months . Vitals . Screenings to include cognitive, depression, and falls . Referrals and appointments  In addition, I have reviewed and discussed with patient certain preventive protocols, quality metrics, and best practice recommendations. A written personalized care plan for preventive services as well as general preventive health recommendations were provided to patient.      Naaman Plummer Beecher, [place]  06/02/2017

## 2017-06-01 ENCOUNTER — Telehealth: Payer: Self-pay | Admitting: *Deleted

## 2017-06-01 NOTE — Telephone Encounter (Addendum)
Patient is aware of results  ----- Message from [name], MD sent at 06/01/2017  6:22 AM EDT ----- Call - iron level is low!!  Need 1 dose of feraheme!!  [name]

## 2017-06-02 ENCOUNTER — Encounter: Payer: Self-pay | Admitting: *Deleted

## 2017-06-02 ENCOUNTER — Ambulatory Visit (INDEPENDENT_AMBULATORY_CARE_PROVIDER_SITE_OTHER): Payer: Medicare Other | Admitting: *Deleted

## 2017-06-02 VITALS — BP 133/87 | HR 98 | Ht 63.0 in | Wt 181.0 lb

## 2017-06-02 DIAGNOSIS — Z0000 Encounter for general adult medical examination without abnormal findings: Secondary | ICD-10-CM

## 2017-06-02 NOTE — Patient Instructions (Addendum)
[name] , Thank you for taking time to come for your Medicare Wellness Visit. I appreciate your ongoing commitment to your health goals. Please review the following plan we discussed and let me know if I can assist you in the future.   These are the goals we discussed: Goals      Patient Stated   . Lose 20 lbs by next year.   (pt-stated)          Increase physical activity-walking        This is a list of the screening recommended for you and due dates:  Health Maintenance  Topic Date Due  . Hemoglobin A1C  December 24, 1955  . Complete foot exam   12/15/1963  . Eye exam for diabetics  12/15/1963  . Urine Protein Check  12/15/1963  . HIV Screening  12/14/1968  . Colon Cancer Screening  06/02/2016  . Flu Shot  04/06/2017  . Mammogram  12/10/2017  . Pneumococcal vaccine (2) 07/23/2018  . Tetanus Vaccine  09/12/2021  .  Hepatitis C: One time screening is recommended by Center for Disease Control  (CDC) for  adults born from [birth year] through 1967.   Completed   Continue to eat heart healthy diet (full of fruits, vegetables, whole grains, lean protein, water--limit salt, fat, and sugar intake) and increase physical activity as tolerated.  Continue doing brain stimulating activities (puzzles, reading, adult coloring books, staying active) to keep memory sharp.  Marland Kitchen   Health Maintenance for Postmenopausal Women Menopause is a normal process in which your reproductive ability comes to an end. This process happens gradually over a span of months to years, usually between the ages of 77 and 41. Menopause is complete when you have missed 12 consecutive menstrual periods. It is important to talk with your health care provider about some of the most common conditions that affect postmenopausal women, such as heart disease, cancer, and bone loss (osteoporosis). Adopting a healthy lifestyle and getting preventive care can help to promote your health and wellness. Those actions can also lower your chances  of developing some of these common conditions. What should I know about menopause? During menopause, you may experience a number of symptoms, such as:  Moderate-to-severe hot flashes.  Night sweats.  Decrease in sex drive.  Mood swings.  Headaches.  Tiredness.  Irritability.  Memory problems.  Insomnia.  Choosing to treat or not to treat menopausal changes is an individual decision that you make with your health care provider. What should I know about hormone replacement therapy and supplements? Hormone therapy products are effective for treating symptoms that are associated with menopause, such as hot flashes and night sweats. Hormone replacement carries certain risks, especially as you become older. If you are thinking about using estrogen or estrogen with progestin treatments, discuss the benefits and risks with your health care provider. What should I know about heart disease and stroke? Heart disease, heart attack, and stroke become more likely as you age. This may be due, in part, to the hormonal changes that your body experiences during menopause. These can affect how your body processes dietary fats, triglycerides, and cholesterol. Heart attack and stroke are both medical emergencies. There are many things that you can do to help prevent heart disease and stroke:  Have your blood pressure checked at least every 1-2 years. High blood pressure causes heart disease and increases the risk of stroke.  If you are 57-87 years old, ask your health care provider if you should take  aspirin to prevent a heart attack or a stroke.  Do not use any tobacco products, including cigarettes, chewing tobacco, or electronic cigarettes. If you need help quitting, ask your health care provider.  It is important to eat a healthy diet and maintain a healthy weight. ? Be sure to include plenty of vegetables, fruits, low-fat dairy products, and lean protein. ? Avoid eating foods that are high in  solid fats, added sugars, or salt (sodium).  Get regular exercise. This is one of the most important things that you can do for your health. ? Try to exercise for at least 150 minutes each week. The type of exercise that you do should increase your heart rate and make you sweat. This is known as moderate-intensity exercise. ? Try to do strengthening exercises at least twice each week. Do these in addition to the moderate-intensity exercise.  Know your numbers.Ask your health care provider to check your cholesterol and your blood glucose. Continue to have your blood tested as directed by your health care provider.  What should I know about cancer screening? There are several types of cancer. Take the following steps to reduce your risk and to catch any cancer development as early as possible. Breast Cancer  Practice breast self-awareness. ? This means understanding how your breasts normally appear and feel. ? It also means doing regular breast self-exams. Let your health care provider know about any changes, no matter how small.  If you are 84 or older, have a clinician do a breast exam (clinical breast exam or CBE) every year. Depending on your age, family history, and medical history, it may be recommended that you also have a yearly breast X-ray (mammogram).  If you have a family history of breast cancer, talk with your health care provider about genetic screening.  If you are at high risk for breast cancer, talk with your health care provider about having an MRI and a mammogram every year.  Breast cancer (BRCA) gene test is recommended for women who have family members with BRCA-related cancers. Results of the assessment will determine the need for genetic counseling and BRCA1 and for BRCA2 testing. BRCA-related cancers include these types: ? Breast. This occurs in males or females. ? Ovarian. ? Tubal. This may also be called fallopian tube cancer. ? Cancer of the abdominal or pelvic  lining (peritoneal cancer). ? Prostate. ? Pancreatic.  Cervical, Uterine, and Ovarian Cancer Your health care provider may recommend that you be screened regularly for cancer of the pelvic organs. These include your ovaries, uterus, and vagina. This screening involves a pelvic exam, which includes checking for microscopic changes to the surface of your cervix (Pap test).  For women ages 21-65, health care providers may recommend a pelvic exam and a Pap test every three years. For women ages 52-65, they may recommend the Pap test and pelvic exam, combined with testing for human papilloma virus (HPV), every five years. Some types of HPV increase your risk of cervical cancer. Testing for HPV may also be done on women of any age who have unclear Pap test results.  Other health care providers may not recommend any screening for nonpregnant women who are considered low risk for pelvic cancer and have no symptoms. Ask your health care provider if a screening pelvic exam is right for you.  If you have had past treatment for cervical cancer or a condition that could lead to cancer, you need Pap tests and screening for cancer for at least 20  years after your treatment. If Pap tests have been discontinued for you, your risk factors (such as having a new sexual partner) need to be reassessed to determine if you should start having screenings again. Some women have medical problems that increase the chance of getting cervical cancer. In these cases, your health care provider may recommend that you have screening and Pap tests more often.  If you have a family history of uterine cancer or ovarian cancer, talk with your health care provider about genetic screening.  If you have vaginal bleeding after reaching menopause, tell your health care provider.  There are currently no reliable tests available to screen for ovarian cancer.  Lung Cancer Lung cancer screening is recommended for adults 64-63 years old who  are at high risk for lung cancer because of a history of smoking. A yearly low-dose CT scan of the lungs is recommended if you:  Currently smoke.  Have a history of at least 30 pack-years of smoking and you currently smoke or have quit within the past 15 years. A pack-year is smoking an average of one pack of cigarettes per day for one year.  Yearly screening should:  Continue until it has been 15 years since you quit.  Stop if you develop a health problem that would prevent you from having lung cancer treatment.  Colorectal Cancer  This type of cancer can be detected and can often be prevented.  Routine colorectal cancer screening usually begins at age 22 and continues through age 18.  If you have risk factors for colon cancer, your health care provider may recommend that you be screened at an earlier age.  If you have a family history of colorectal cancer, talk with your health care provider about genetic screening.  Your health care provider may also recommend using home test kits to check for hidden blood in your stool.  A small camera at the end of a tube can be used to examine your colon directly (sigmoidoscopy or colonoscopy). This is done to check for the earliest forms of colorectal cancer.  Direct examination of the colon should be repeated every 5-10 years until age 64. However, if early forms of precancerous polyps or small growths are found or if you have a family history or genetic risk for colorectal cancer, you may need to be screened more often.  Skin Cancer  Check your skin from head to toe regularly.  Monitor any moles. Be sure to tell your health care provider: ? About any new moles or changes in moles, especially if there is a change in a mole's shape or color. ? If you have a mole that is larger than the size of a pencil eraser.  If any of your family members has a history of skin cancer, especially at a young age, talk with your health care provider about  genetic screening.  Always use sunscreen. Apply sunscreen liberally and repeatedly throughout the day.  Whenever you are outside, protect yourself by wearing long sleeves, pants, a wide-brimmed hat, and sunglasses.  What should I know about osteoporosis? Osteoporosis is a condition in which bone destruction happens more quickly than new bone creation. After menopause, you may be at an increased risk for osteoporosis. To help prevent osteoporosis or the bone fractures that can happen because of osteoporosis, the following is recommended:  If you are 61-62 years old, get at least 1,000 mg of calcium and at least 600 mg of vitamin D per day.  If you are  older than age 35 but younger than age 44, get at least 1,200 mg of calcium and at least 600 mg of vitamin D per day.  If you are older than age 40, get at least 1,200 mg of calcium and at least 800 mg of vitamin D per day.  Smoking and excessive alcohol intake increase the risk of osteoporosis. Eat foods that are rich in calcium and vitamin D, and do weight-bearing exercises several times each week as directed by your health care provider. What should I know about how menopause affects my mental health? Depression may occur at any age, but it is more common as you become older. Common symptoms of depression include:  Low or sad mood.  Changes in sleep patterns.  Changes in appetite or eating patterns.  Feeling an overall lack of motivation or enjoyment of activities that you previously enjoyed.  Frequent crying spells.  Talk with your health care provider if you think that you are experiencing depression. What should I know about immunizations? It is important that you get and maintain your immunizations. These include:  Tetanus, diphtheria, and pertussis (Tdap) booster vaccine.  Influenza every year before the flu season begins.  Pneumonia vaccine.  Shingles vaccine.  Your health care provider may also recommend other  immunizations. This information is not intended to replace advice given to you by your health care provider. Make sure you discuss any questions you have with your health care provider. Document Released: 10/15/2005 Document Revised: 03/12/2016 Document Reviewed: 05/27/2015 Elsevier Interactive Patient Education  2018 [employer].

## 2017-06-06 ENCOUNTER — Ambulatory Visit (HOSPITAL_BASED_OUTPATIENT_CLINIC_OR_DEPARTMENT_OTHER): Payer: Medicare Other

## 2017-06-06 VITALS — BP 148/90 | HR 82 | Temp 98.3°F | Resp 20

## 2017-06-06 DIAGNOSIS — K922 Gastrointestinal hemorrhage, unspecified: Secondary | ICD-10-CM

## 2017-06-06 DIAGNOSIS — D500 Iron deficiency anemia secondary to blood loss (chronic): Secondary | ICD-10-CM | POA: Diagnosis not present

## 2017-06-06 DIAGNOSIS — K625 Hemorrhage of anus and rectum: Secondary | ICD-10-CM

## 2017-06-06 DIAGNOSIS — K5190 Ulcerative colitis, unspecified, without complications: Secondary | ICD-10-CM | POA: Diagnosis not present

## 2017-06-06 MED ORDER — FERUMOXYTOL INJECTION 510 MG/17 ML
510.0000 mg | Freq: Once | INTRAVENOUS | Status: AC
  Administered 2017-06-06: 510 mg via INTRAVENOUS
  Filled 2017-06-06: qty 17

## 2017-06-06 MED ORDER — SODIUM CHLORIDE 0.9 % IV SOLN
Freq: Once | INTRAVENOUS | Status: AC
  Administered 2017-06-06: 11:00:00 via INTRAVENOUS

## 2017-06-06 NOTE — Patient Instructions (Signed)

## 2017-06-07 ENCOUNTER — Other Ambulatory Visit: Payer: Self-pay | Admitting: Family Medicine

## 2017-06-07 DIAGNOSIS — F329 Major depressive disorder, single episode, unspecified: Secondary | ICD-10-CM

## 2017-06-07 DIAGNOSIS — F32A Depression, unspecified: Secondary | ICD-10-CM

## 2017-06-07 NOTE — Telephone Encounter (Signed)
Refaxed/pharm not rec

## 2017-06-10 ENCOUNTER — Ambulatory Visit (HOSPITAL_COMMUNITY): Payer: Medicare Other

## 2017-06-15 ENCOUNTER — Telehealth: Payer: Self-pay | Admitting: Gastroenterology

## 2017-06-15 DIAGNOSIS — I129 Hypertensive chronic kidney disease with stage 1 through stage 4 chronic kidney disease, or unspecified chronic kidney disease: Secondary | ICD-10-CM | POA: Diagnosis not present

## 2017-06-15 DIAGNOSIS — N184 Chronic kidney disease, stage 4 (severe): Secondary | ICD-10-CM | POA: Diagnosis not present

## 2017-06-15 DIAGNOSIS — Z79891 Long term (current) use of opiate analgesic: Secondary | ICD-10-CM | POA: Diagnosis not present

## 2017-06-15 DIAGNOSIS — Z87891 Personal history of nicotine dependence: Secondary | ICD-10-CM | POA: Diagnosis not present

## 2017-06-15 DIAGNOSIS — K219 Gastro-esophageal reflux disease without esophagitis: Secondary | ICD-10-CM | POA: Diagnosis not present

## 2017-06-15 DIAGNOSIS — E876 Hypokalemia: Secondary | ICD-10-CM | POA: Diagnosis not present

## 2017-06-15 DIAGNOSIS — K51911 Ulcerative colitis, unspecified with rectal bleeding: Secondary | ICD-10-CM | POA: Diagnosis not present

## 2017-06-15 DIAGNOSIS — D500 Iron deficiency anemia secondary to blood loss (chronic): Secondary | ICD-10-CM | POA: Diagnosis not present

## 2017-06-15 DIAGNOSIS — Z86718 Personal history of other venous thrombosis and embolism: Secondary | ICD-10-CM | POA: Diagnosis not present

## 2017-06-15 DIAGNOSIS — Z452 Encounter for adjustment and management of vascular access device: Secondary | ICD-10-CM | POA: Diagnosis not present

## 2017-06-15 NOTE — Telephone Encounter (Signed)
[name] - would you please call this patient?  She has questions about Entyvio.  Thank you.

## 2017-06-15 NOTE — Telephone Encounter (Signed)
Called pharmacy, spoke to [place] at [facility]. They have just merged with [facility] and [facility] but they needed demographic information and order for Entyvio home infusions. Gave pharmacist the order.

## 2017-06-21 ENCOUNTER — Encounter: Payer: Self-pay | Admitting: Family Medicine

## 2017-06-21 ENCOUNTER — Ambulatory Visit (INDEPENDENT_AMBULATORY_CARE_PROVIDER_SITE_OTHER): Payer: Medicare Other | Admitting: Family Medicine

## 2017-06-21 ENCOUNTER — Ambulatory Visit (HOSPITAL_BASED_OUTPATIENT_CLINIC_OR_DEPARTMENT_OTHER)
Admission: RE | Admit: 2017-06-21 | Discharge: 2017-06-21 | Disposition: A | Discharge status: Home / Self Care | Payer: Medicare Other | Source: Ambulatory Visit | Attending: Family Medicine | Admitting: Family Medicine

## 2017-06-21 VITALS — BP 138/90 | HR 77 | Temp 98.0°F | Ht 63.0 in | Wt 185.0 lb

## 2017-06-21 DIAGNOSIS — M419 Scoliosis, unspecified: Secondary | ICD-10-CM | POA: Diagnosis not present

## 2017-06-21 DIAGNOSIS — M5441 Lumbago with sciatica, right side: Secondary | ICD-10-CM | POA: Insufficient documentation

## 2017-06-21 DIAGNOSIS — M10071 Idiopathic gout, right ankle and foot: Secondary | ICD-10-CM | POA: Diagnosis not present

## 2017-06-21 DIAGNOSIS — M2578 Osteophyte, vertebrae: Secondary | ICD-10-CM | POA: Insufficient documentation

## 2017-06-21 DIAGNOSIS — G8929 Other chronic pain: Secondary | ICD-10-CM | POA: Diagnosis not present

## 2017-06-21 DIAGNOSIS — M25551 Pain in right hip: Secondary | ICD-10-CM

## 2017-06-21 DIAGNOSIS — M4807 Spinal stenosis, lumbosacral region: Secondary | ICD-10-CM | POA: Diagnosis not present

## 2017-06-21 DIAGNOSIS — M545 Low back pain, unspecified: Secondary | ICD-10-CM | POA: Insufficient documentation

## 2017-06-21 DIAGNOSIS — M19071 Primary osteoarthritis, right ankle and foot: Secondary | ICD-10-CM | POA: Diagnosis not present

## 2017-06-21 DIAGNOSIS — M109 Gout, unspecified: Secondary | ICD-10-CM | POA: Insufficient documentation

## 2017-06-21 DIAGNOSIS — M48061 Spinal stenosis, lumbar region without neurogenic claudication: Secondary | ICD-10-CM | POA: Diagnosis not present

## 2017-06-21 DIAGNOSIS — Z0289 Encounter for other administrative examinations: Secondary | ICD-10-CM | POA: Diagnosis not present

## 2017-06-21 DIAGNOSIS — M5137 Other intervertebral disc degeneration, lumbosacral region: Secondary | ICD-10-CM | POA: Diagnosis not present

## 2017-06-21 LAB — COMPREHENSIVE METABOLIC PANEL
ALT: 17 U/L (ref 0–35)
AST: 19 U/L (ref 0–37)
Albumin: 3.8 g/dL (ref 3.5–5.2)
Alkaline Phosphatase: 56 U/L (ref 39–117)
BUN: 35 mg/dL — ABNORMAL HIGH (ref 6–23)
CO2: 26 mEq/L (ref 19–32)
Calcium: 9.4 mg/dL (ref 8.4–10.5)
Chloride: 104 mEq/L (ref 96–112)
Creatinine, Ser: 2.07 mg/dL — ABNORMAL HIGH (ref 0.40–1.20)
GFR: 25.66 mL/min — ABNORMAL LOW (ref >60.00)
Glucose, Bld: 104 mg/dL — ABNORMAL HIGH (ref 70–99)
Potassium: 3.6 mEq/L (ref 3.5–5.1)
Sodium: 137 mEq/L (ref 135–145)
Total Bilirubin: 0.2 mg/dL (ref 0.2–1.2)
Total Protein: 7 g/dL (ref 6.0–8.3)

## 2017-06-21 LAB — URIC ACID: Uric Acid, Serum: 4.5 mg/dL (ref 2.4–7.0)

## 2017-06-21 IMAGING — CR DG LUMBAR SPINE COMPLETE 4+V
5 series · 5 of 5 positions shown · non-contrast
Comparison: [DATE] CT.

CLINICAL DATA: 61-year-old female with low back, right hip and
ankle pain for 6 months. No injury. Initial encounter.

EXAM:
LUMBAR SPINE - COMPLETE 4+ VIEW

[t l-spine a.p.]
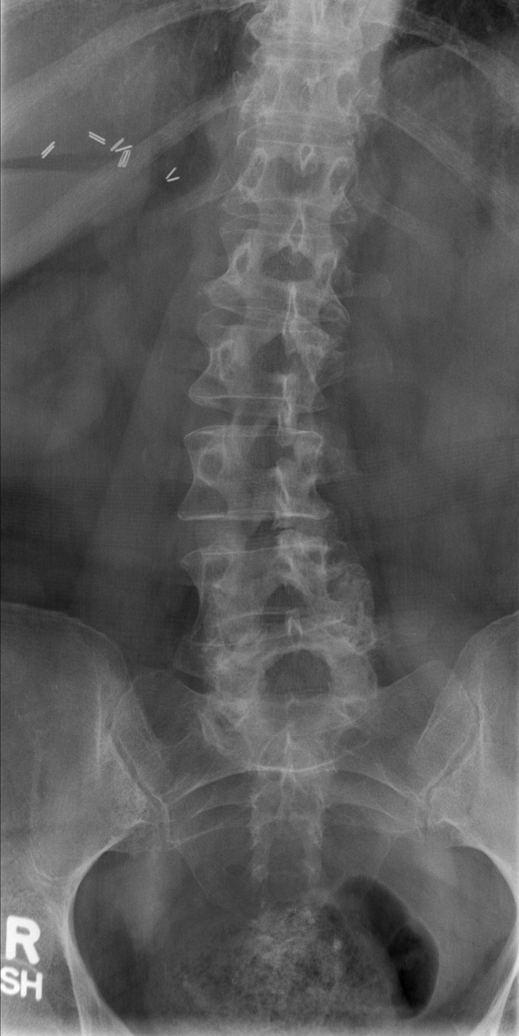

[t l-spine oblique exposure (1 of 2)]
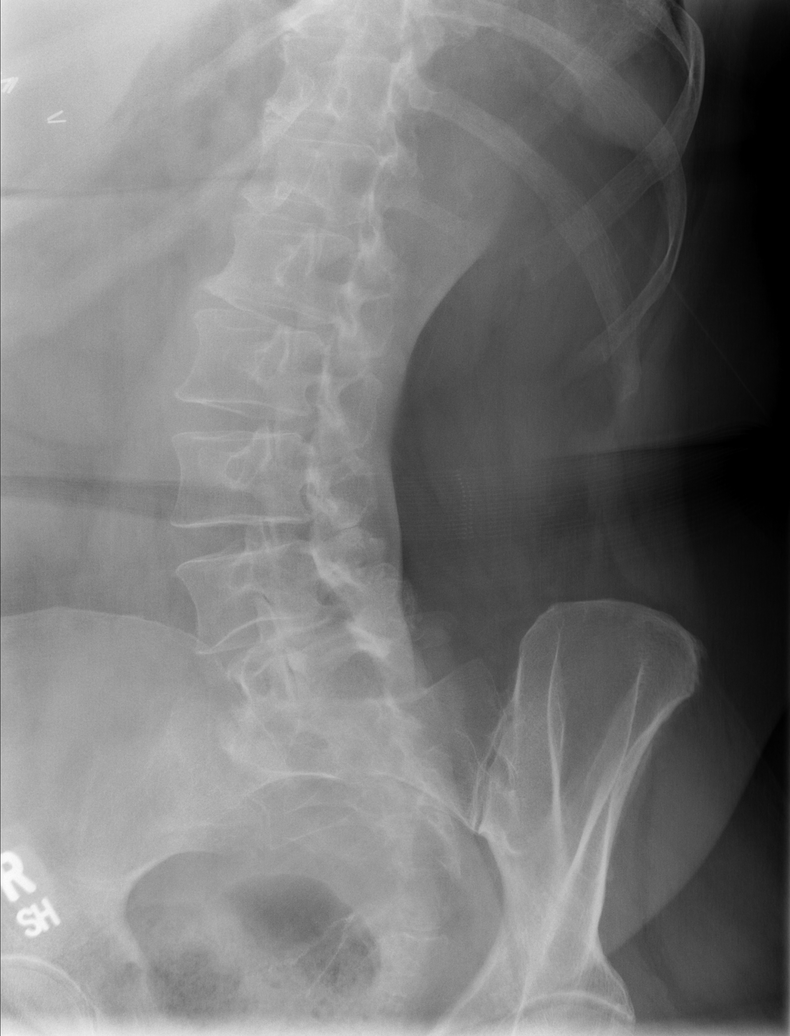

[t l-spine oblique exposure (2 of 2)]
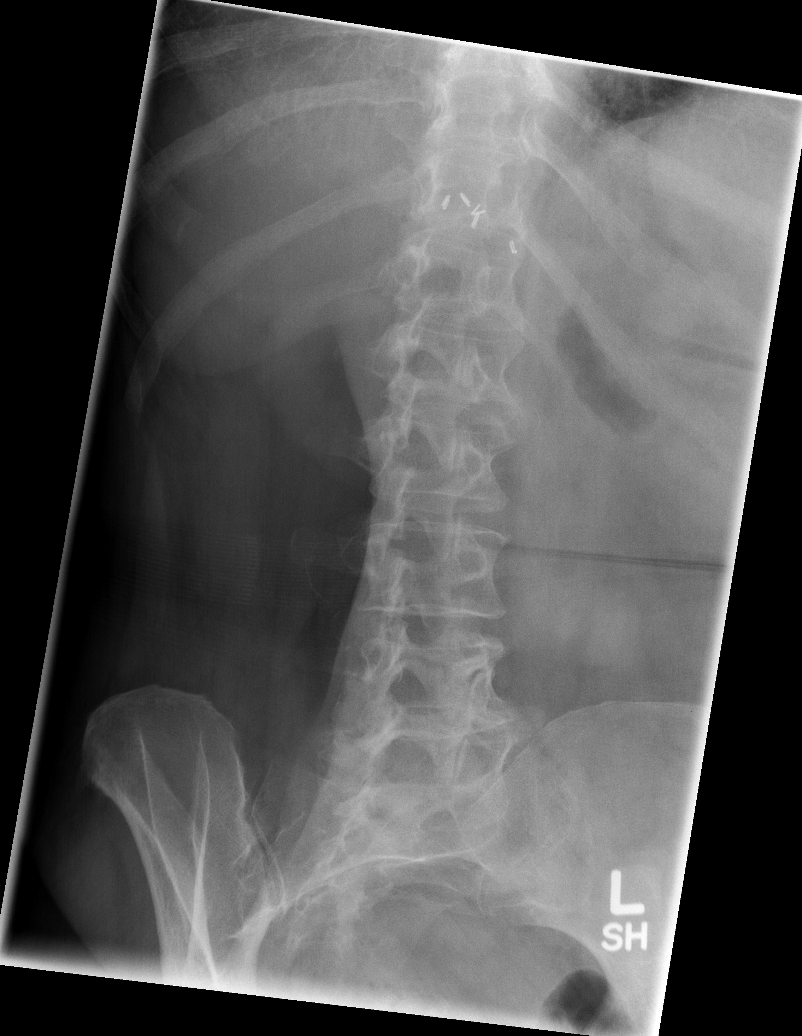

[t l-spine lat]
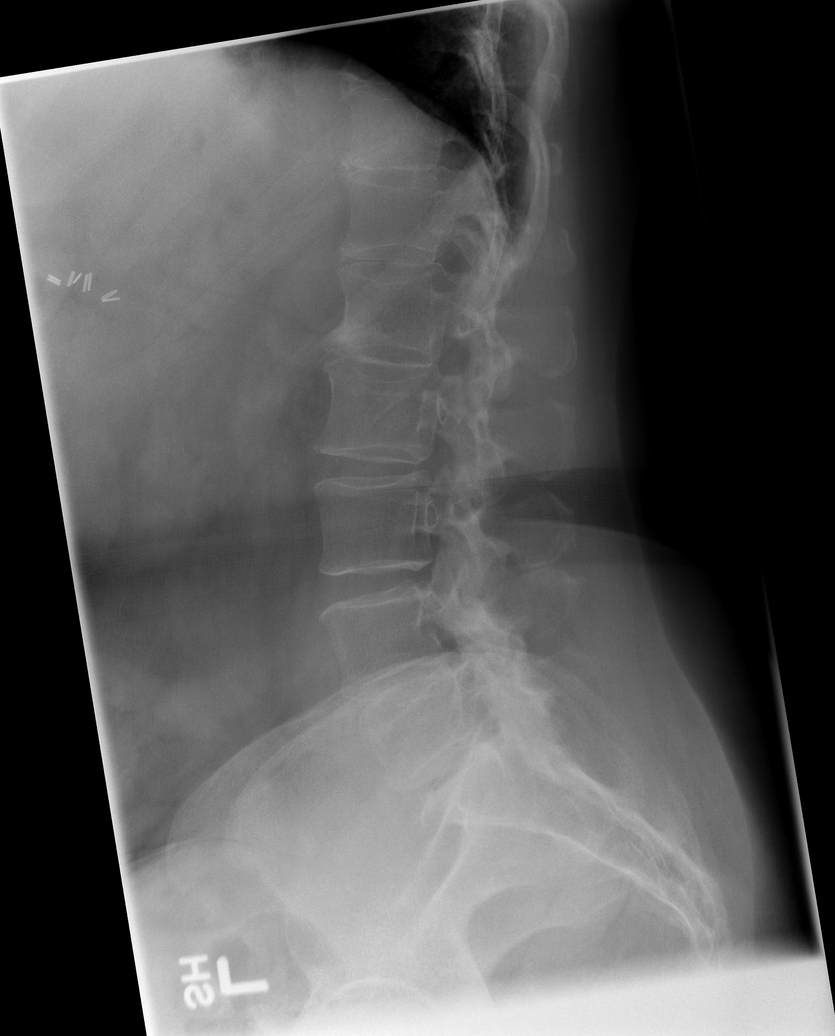

[t l-spine l5-s1 spot]
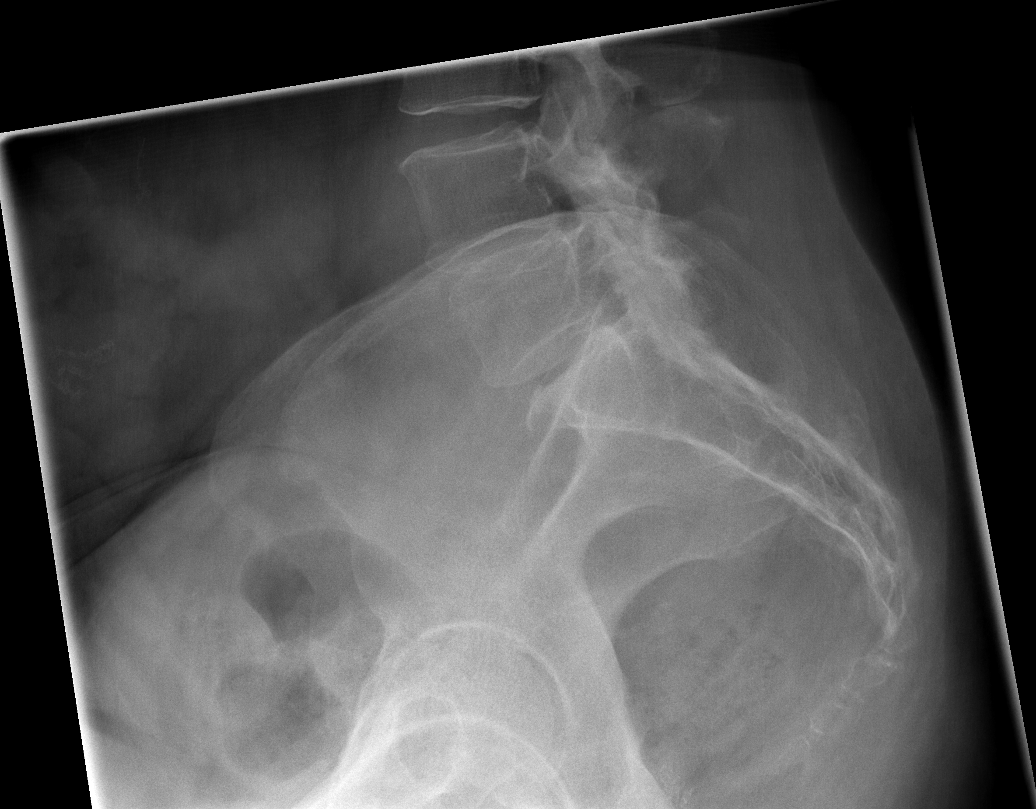

[5 of 5 positions shown; findings below may reference images not displayed]

FINDINGS: Scoliosis lumbar spine convex right.

Facet degenerative changes L4-5 and L5-S1. L4-5 and L5-S1 disc space
narrowing. Minimal shift of L4 to the right.

Minimal L1-2 disc space narrowing with osteophyte.

Prior cholecystectomy.
IMPRESSION: Scoliosis lumbar spine convex right.

Facet degenerative changes L4-5 and L5-S1. L4-5 and L5-S1 disc space
narrowing. Minimal shift of L4 to the right.

Minimal L1-2 disc space narrowing with osteophyte.

## 2017-06-21 IMAGING — CR DG HIP (WITH OR WITHOUT PELVIS) 2-3V*R*
3 series · 3 of 3 positions shown · non-contrast
Comparison: None.

CLINICAL DATA: 61-year-old female with low back, right hip and
ankle pain for 6 months. No injury. Initial encounter.

EXAM:
DG HIP (WITH OR WITHOUT PELVIS) 2-3V RIGHT

[t pelvis a.p.]
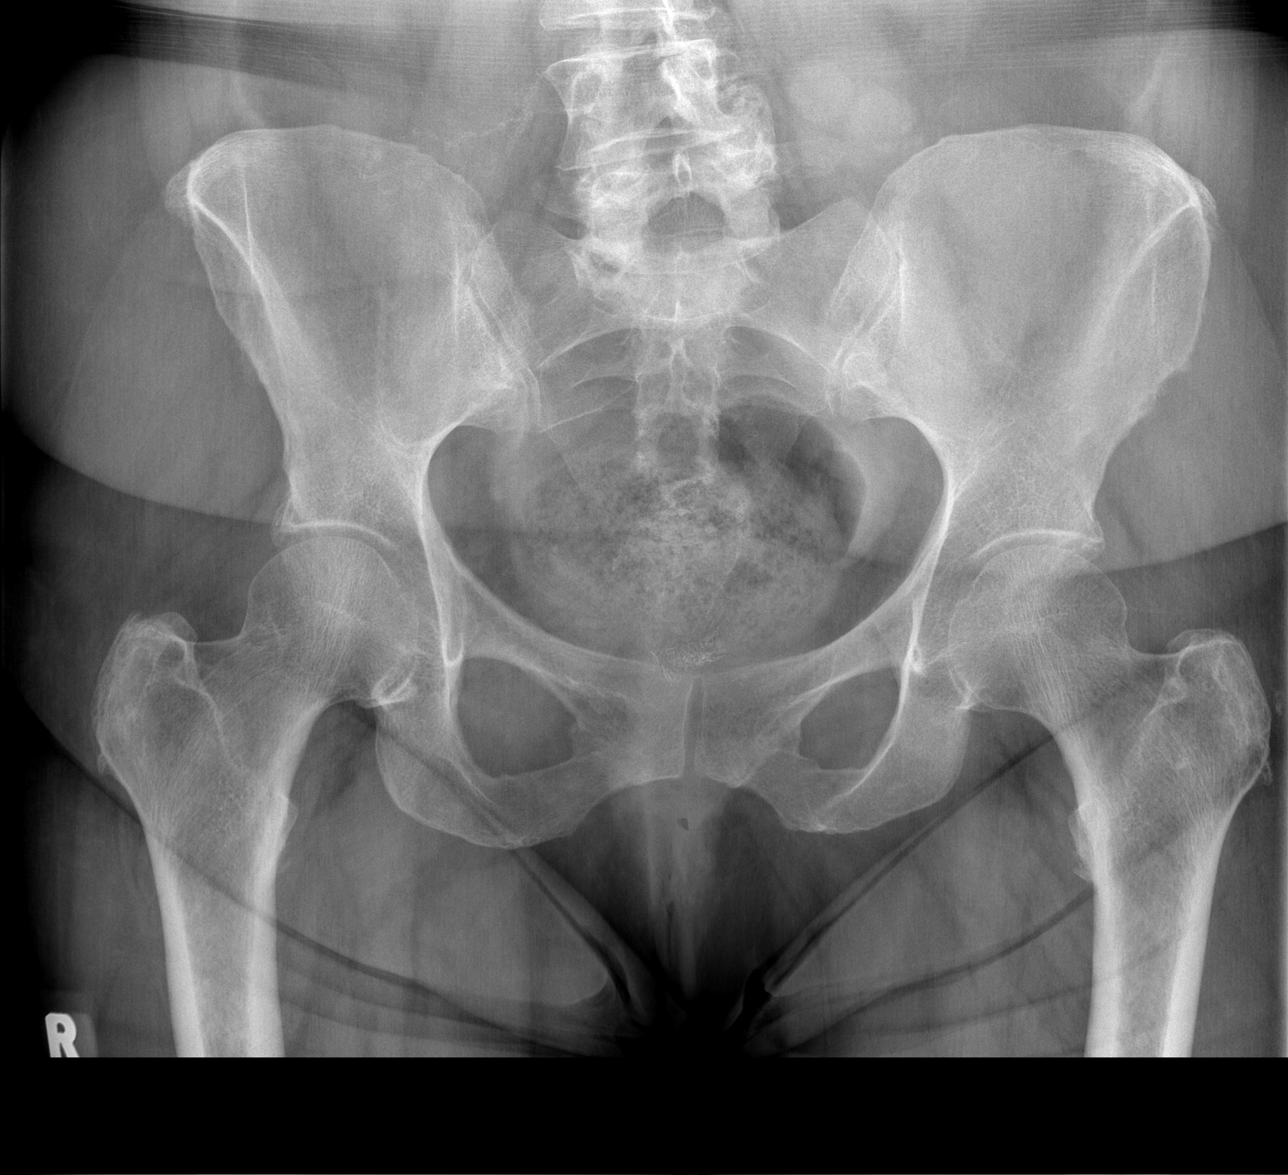

[t hip ap right]
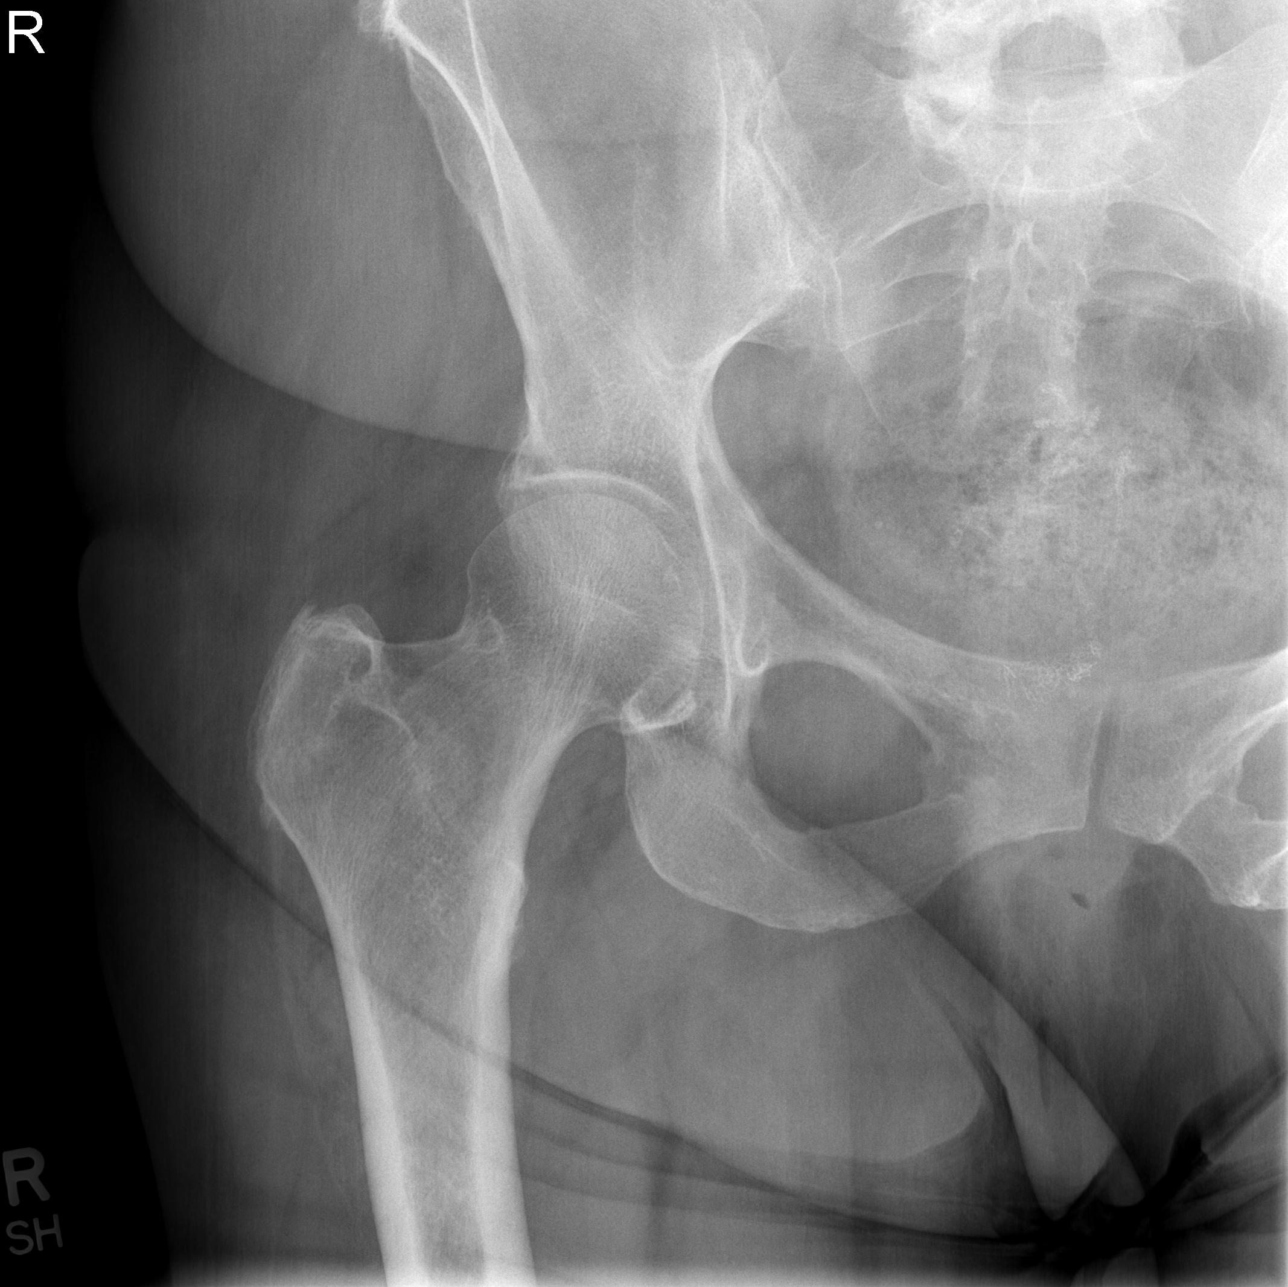

[t hip frog leg right]
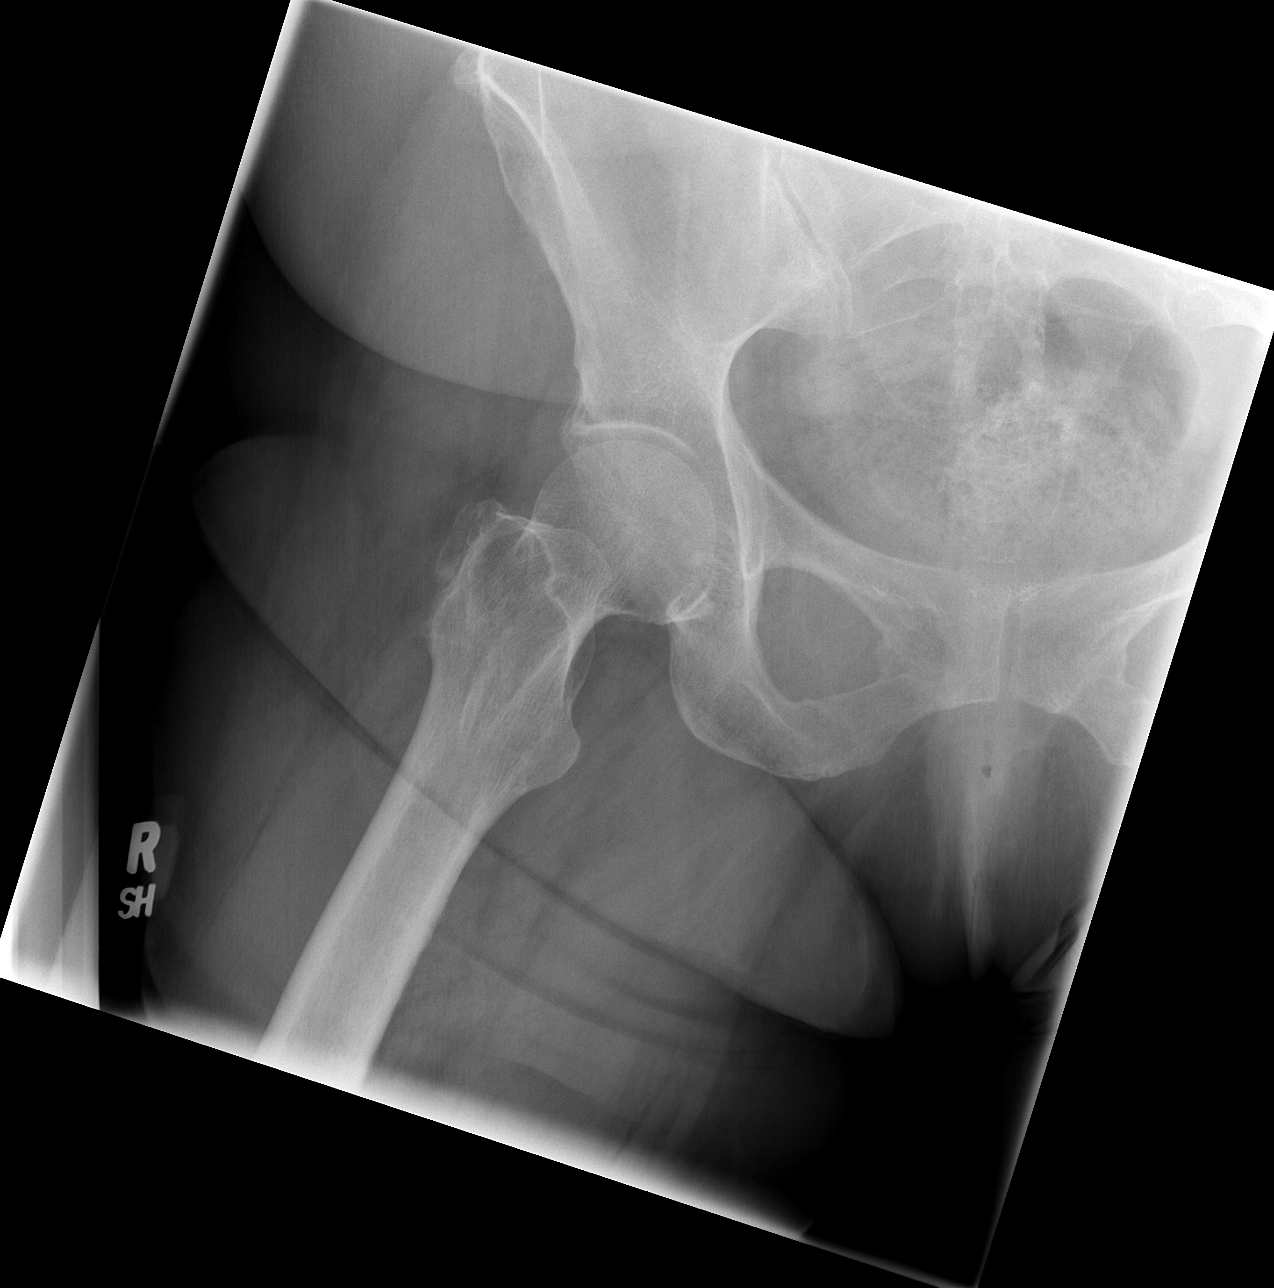

[3 of 3 positions shown; findings below may reference images not displayed]

FINDINGS: No significant hip joint space narrowing.

No plain film evidence of avascular necrosis.

Tiny herniation pit incidentally noted.

No fracture or dislocation.
IMPRESSION: No significant hip joint degenerative changes.

Femoral neck herniation pit without change compared to [QC] CT and
felt to be incidental finding.

## 2017-06-21 IMAGING — CR DG ANKLE COMPLETE 3+V*R*
3 series · 3 of 3 positions shown · non-contrast
Comparison: None.

CLINICAL DATA: 61-year-old female with low back, right hip and
ankle pain for 6 months. No injury. Initial encounter.

EXAM:
RIGHT ANKLE - COMPLETE 3+ VIEW

[t ankle joint ap right]
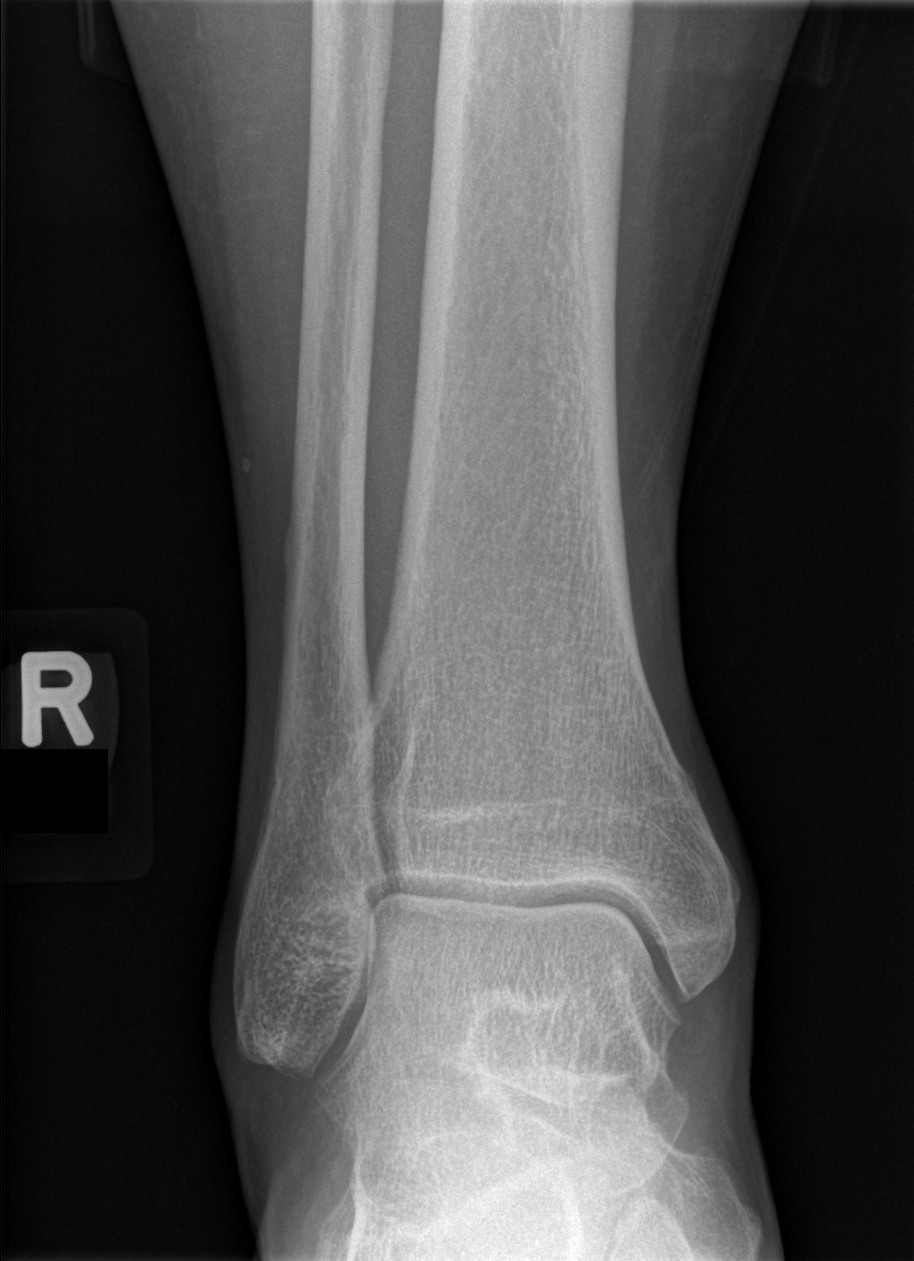

[t ankle joint oblique right]
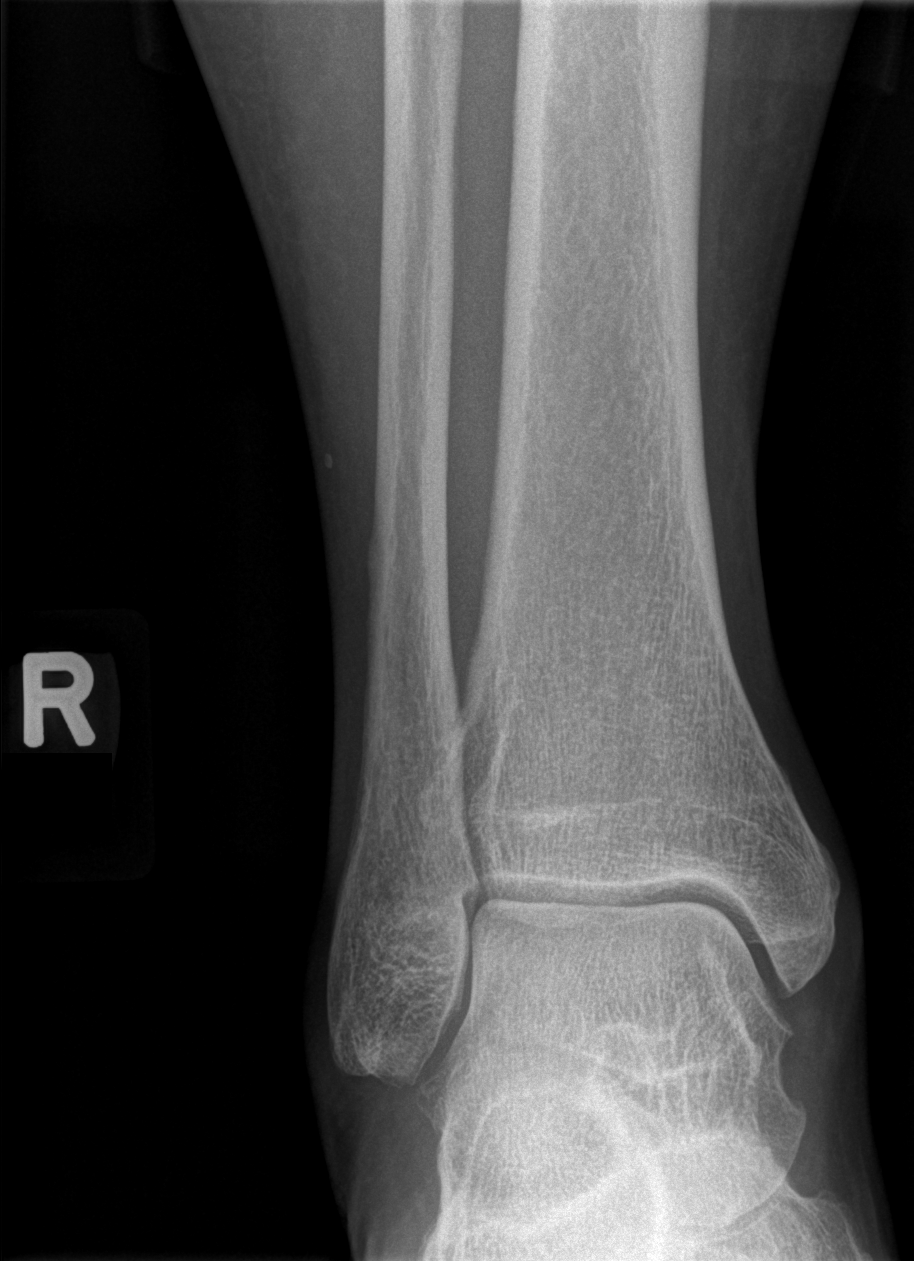

[t ankle joint lat right]
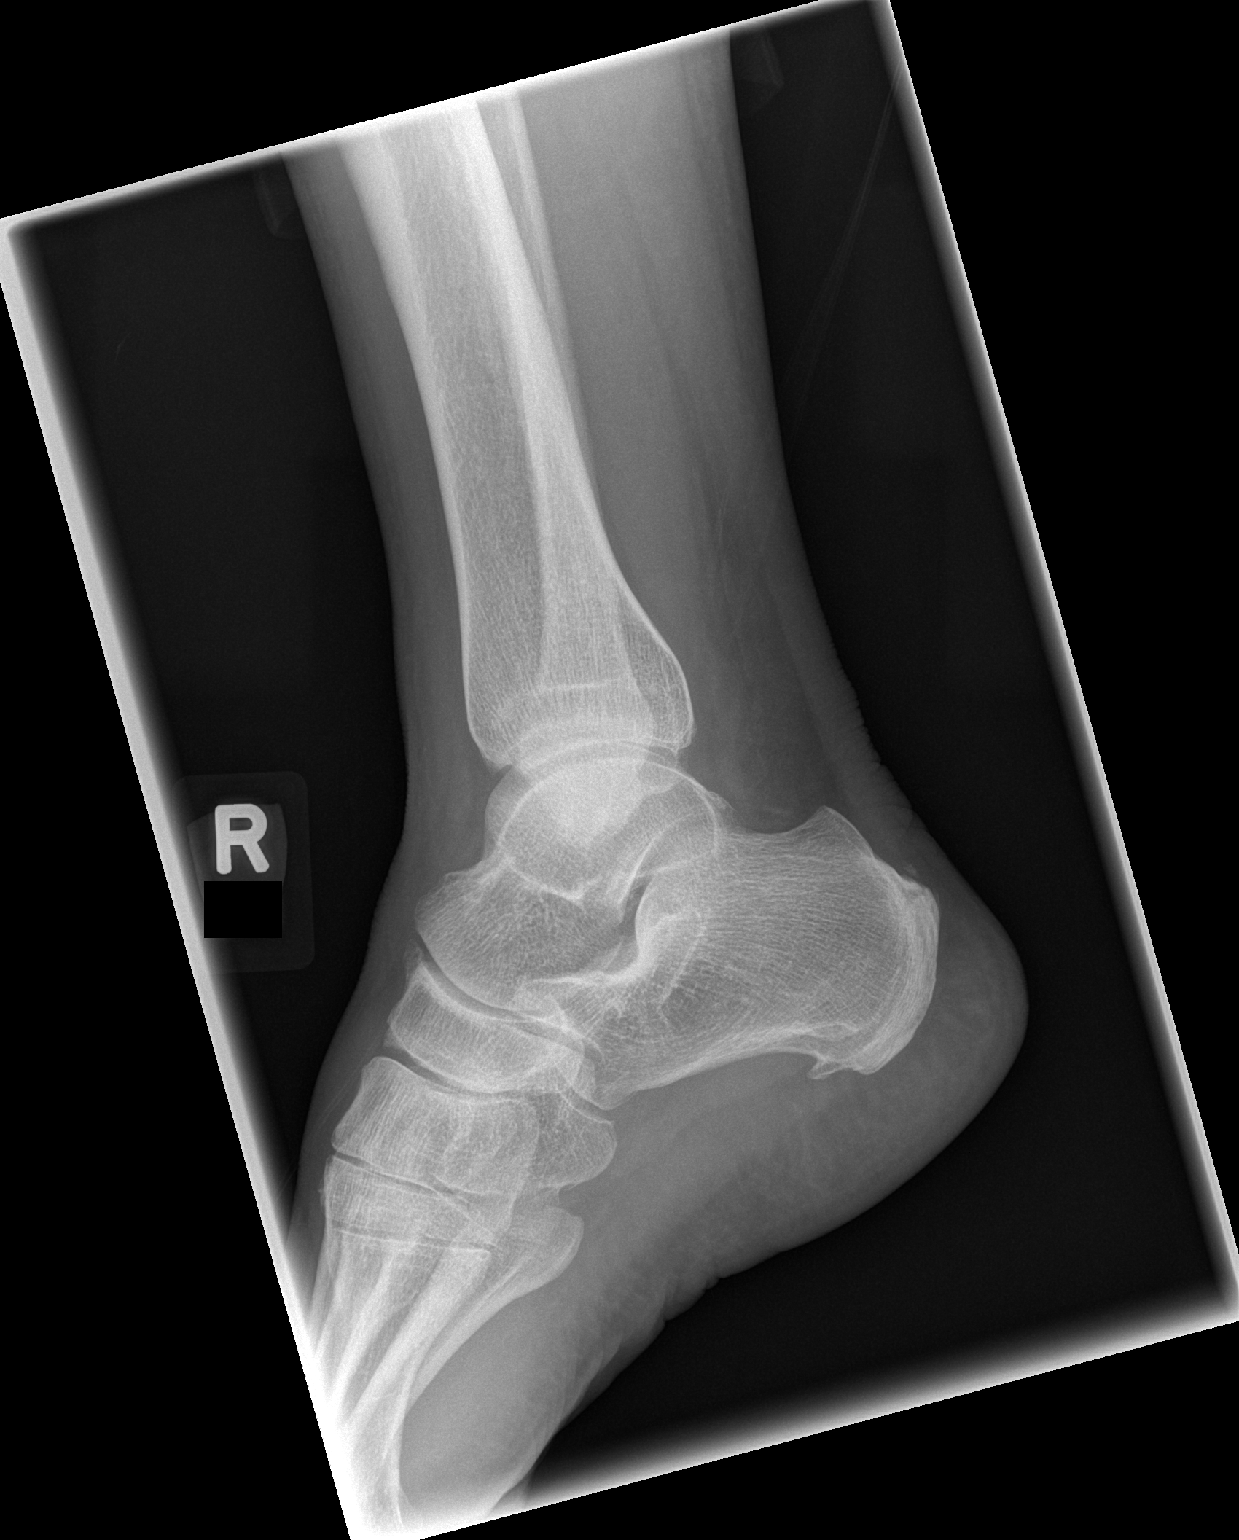

[3 of 3 positions shown; findings below may reference images not displayed]

FINDINGS: No fracture or dislocation.

Minimal degenerative changes talonavicular articulation.

Moderate-size plantar spur.

Minimal calcification distal Achilles tendon.
IMPRESSION: No fracture or dislocation.

Minimal degenerative changes talonavicular articulation.

Moderate-size plantar spur.

## 2017-06-21 NOTE — Assessment & Plan Note (Signed)
Swollen today Check labs  No known injury-- pt requesting xray

## 2017-06-21 NOTE — Progress Notes (Signed)
Patient ID: [name], female    DOB: 01-19-1956  Age: 61 y.o. MRN: [id number]    Subjective:  Subjective  HPI Suttyn Cryder presents for low back pain the radiates down R leg to foot.  No known injury.  She c/o R ankle pain and r hip pain as well.    Review of Systems  Constitutional: Negative for appetite change, diaphoresis, fatigue and unexpected weight change.  Eyes: Negative for pain, redness and visual disturbance.  Respiratory: Negative for cough, chest tightness, shortness of breath and wheezing.   Cardiovascular: Negative for chest pain, palpitations and leg swelling.  Endocrine: Negative for cold intolerance, heat intolerance, polydipsia, polyphagia and polyuria.  Genitourinary: Negative for difficulty urinating, dysuria and frequency.  Musculoskeletal: Positive for back pain.  Neurological: Positive for numbness. Negative for dizziness, light-headedness and headaches.    History Past Medical History:  Diagnosis Date  . Abdominal pain    Hx  . Allergy   . Anal stenosis   . Anemia   . Anxiety   . Arthritis   . Asthma    patient does not have inhaler  . Blood in stool    Hx  . Blood in urine    Hx  . Blood transfusion without reported diagnosis   . De Quervain's tenosynovitis   . Depression   . Difficulty urinating    Hx  . Easy bruising   . Esophagitis   . Fainting    History - resolved - due to dehydration  . Fatigue    Hx  . Fibroid    Hx  . Gastric polyp   . GERD (gastroesophageal reflux disease)   . 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 Hemorrhoids, external   . Hemorrhoids, internal   . Herpes, genital    vaginal treated 07/05/12 and pt states is resolved  . History of cervical dysplasia   . History of small bowel obstruction   . Hyperlipidemia    currently no meds  . Hyperparathyroidism   . Hypertension   . Hypokalemia    Hx  . 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 Obesity   . Ovarian cyst   . Poor dental hygiene   . Pulmonary nodule, right    right upper lobe  . Rectal bleeding    Hx  . Rectal pain    Hx  . Renal insufficiency    CKD - stage 2  . RLS (restless legs syndrome)    no meds  . Tooth infection 11/2016   right low  . Ulcerative colitis   . Visual disturbance    wears glasses  . Weakness generalized    Hx - patient denies generalized weakness  . Wears dentures    upper only    She has a past surgical history that includes Anal dilation; Restorative proctocolectomy; Ileostomy closure; Cholecystectomy; Tubal ligation; Hemorrhoid surgery; fatty tumor removed from back; Total abdominal hysterectomy (1998); Cervical biopsy w/ loop electrode excision; Upper gastrointestinal endoscopy; Colonoscopy; and Sigmoidoscopy.   Her family history includes Cancer in her maternal uncle and sister; Diabetes in her sister; Heart attack in her father; Heart disease in her mother; Hypertension in her father and mother; Irritable bowel syndrome in her unknown relative; Ulcerative colitis in her daughter and father.She reports that she quit smoking about 38 years ago.  Her smoking use included Cigarettes. She has a 4.00 pack-year smoking history. She has never used smokeless tobacco. She reports that she does not drink alcohol or use drugs.  Current Outpatient Prescriptions on File Prior to Visit  Medication Sig Dispense Refill  . BIOTIN PO Take by mouth daily.    . calcitRIOL (ROCALTROL) 0.25 MCG capsule Take 0.25 mcg by mouth every other day. Mon, Wed, Fri    . DULoxetine (CYMBALTA) 60 MG capsule TAKE 2 CAPSULES BY MOUTH DAILY. PATIENT NEEDS EVAL/OR LAB TESTING BEFORE ADDT'L REFILLS ARE GIVEN 180 capsule 0  . DULoxetine (CYMBALTA) 60 MG capsule TAKE 2 CAPSULES BY MOUTH  DAILY. PATIENT NEEDS  EVAL/OR LAB TESTING BEFORE  ADDT&apos;L REFILLS ARE GIVEN 180 capsule 0  . febuxostat (ULORIC) 40 MG tablet Take 1  tablet (40 mg total) by mouth daily.    . folic acid (FOLVITE) 1 MG tablet Take 2 tablets (2 mg total) by mouth daily. 180 tablet 3  . HYDROcodone-acetaminophen (NORCO/VICODIN) 5-325 MG tablet Take 2 tablets by mouth every 4 (four) hours as needed for moderate pain or severe pain. 90 tablet 0  . Melatonin 2.5 MG CAPS Take 2.5 mg by mouth at bedtime.     . metoprolol succinate (TOPROL-XL) 50 MG 24 hr tablet Take 1 tablet (50 mg total) by mouth daily. 90 tablet 3  . Multiple Vitamins-Minerals (MULTIVITAL) CHEW Chew 1 tablet by mouth daily. Reported on 03/23/2016    . ranitidine (ZANTAC) 150 MG tablet TAKE 1 TABLET BY MOUTH TWO  TIMES DAILY 180 tablet 0  . sodium chloride 0.9 % SOLN 250 mL with vedolizumab 300 MG SOLR 300 mg Inject 300 mg into the vein every 30 (thirty) days. 1 Dose 12   No current facility-administered medications on file prior to visit.      Objective:  Objective  Physical Exam  Constitutional: She is oriented to person, place, and time. She appears well-developed and well-nourished.  HENT:  Head: Normocephalic and atraumatic.  Eyes: Conjunctivae and EOM are normal.  Neck: Normal range of motion. Neck supple. No JVD present. Carotid bruit is not present. No thyromegaly present.  Cardiovascular: Normal rate, regular rhythm and normal heart sounds.   No murmur heard. Pulmonary/Chest: Effort normal and breath sounds normal. No respiratory distress. She has no wheezes. She has no rales. She exhibits no tenderness.  Musculoskeletal: Normal range of motion. She exhibits no edema, tenderness or deformity.  Neurological: She is alert and oriented to person, place, and time. She has normal reflexes. She displays normal reflexes. She exhibits normal muscle tone.  Psychiatric: She has a normal mood and affect.  Nursing note and vitals reviewed.  BP 138/90   Pulse 77   Temp 98 F (36.7 C) (Oral)   Ht 5' 3"  (1.6 m)   Wt 185 lb (83.9 kg)   SpO2 97%   BMI 32.77 kg/m  Wt Readings  from Last 3 Encounters:  06/21/17 185 lb (83.9 kg)  06/02/17 181 lb (82.1 kg)  05/26/17 182 lb (82.6 kg)     Lab Results  Component Value Date   WBC 8.2 05/26/2017   HGB 9.6 (L) 05/26/2017   HCT 28.7 (L) 05/26/2017   PLT 307 05/26/2017   GLUCOSE 108 (H) 05/10/2017   CHOL 247 (H) 05/10/2017   TRIG 307.0 (H) 05/10/2017   HDL 45.70 05/10/2017   LDLDIRECT 127.0 05/10/2017   LDLCALC 122 (H) 11/22/2014   ALT 20 05/10/2017   AST 23 05/10/2017   NA  135 05/10/2017   K 3.9 05/10/2017   CL 103 05/10/2017   CREATININE 2.27 (H) 05/10/2017   BUN 35 (H) 05/10/2017   CO2 23 05/10/2017   TSH 2.624 01/15/2017    No results found.   Assessment & Plan:  Plan  I have discontinued Ms. Rockwood AMBULATORY NON FORMULARY MEDICATION. I am also having her maintain her MULTIVITAL, calcitRIOL, BIOTIN PO, Melatonin, metoprolol succinate, HYDROcodone-acetaminophen, ranitidine, sodium chloride 0.9 % SOLN 250 mL with vedolizumab 300 MG SOLR 300 mg, febuxostat, DULoxetine, folic acid, and DULoxetine.  No orders of the defined types were placed in this encounter.   Problem List Items Addressed This Visit      Unprioritized   Acute right-sided low back pain with right-sided sciatica - Primary   Relevant Orders   DG Lumbar Spine Complete   Antinuclear Antib (ANA)   Idiopathic gout of right ankle    Swollen today Check labs  No known injury-- pt requesting xray      Relevant Orders   Comprehensive metabolic panel   Uric acid   DG Ankle Complete Right   Rheumatoid Factor   Antinuclear Antib (ANA)    Other Visit Diagnoses    Right hip pain       Relevant Orders   DG HIP UNILAT WITH PELVIS 2-3 VIEWS RIGHT   Rheumatoid Factor   Antinuclear Antib (ANA)   Pain management contract agreement       Relevant Orders   Pain Mgmt, Profile 8 w/Conf, U      Follow-up: Return in about 3 months (around 09/21/2017), or if symptoms worsen or fail to improve.  Ann Held, DO

## 2017-06-21 NOTE — Assessment & Plan Note (Signed)
Pt has pain meds at home Check xray

## 2017-06-21 NOTE — Patient Instructions (Signed)

## 2017-06-24 LAB — PAIN MGMT, PROFILE 8 W/CONF, U
6 Acetylmorphine: NEGATIVE ng/mL (ref <10)
Alcohol Metabolites: NEGATIVE ng/mL (ref <500)
Amphetamines: NEGATIVE ng/mL (ref <500)
Benzodiazepines: NEGATIVE ng/mL (ref <100)
Buprenorphine, Urine: NEGATIVE ng/mL (ref <5)
Cocaine Metabolite: NEGATIVE ng/mL (ref <150)
Codeine: NEGATIVE ng/mL (ref <50)
Creatinine: 183.2 mg/dL
Hydrocodone: 1061 ng/mL — ABNORMAL HIGH (ref <50)
Hydromorphone: NEGATIVE ng/mL (ref <50)
MDMA: NEGATIVE ng/mL (ref <500)
Marijuana Metabolite: NEGATIVE ng/mL (ref <20)
Morphine: NEGATIVE ng/mL (ref <50)
Norhydrocodone: 1055 ng/mL — ABNORMAL HIGH (ref <50)
Opiates: POSITIVE ng/mL — AB (ref <100)
Oxidant: NEGATIVE ug/mL (ref <200)
Oxycodone: NEGATIVE ng/mL (ref <100)
pH: 7.25 (ref 4.5–9.0)

## 2017-06-24 LAB — RHEUMATOID FACTOR: Rhuematoid fact SerPl-aCnc: <14 IU/mL (ref <14)

## 2017-06-24 LAB — ANTI-NUCLEAR AB-TITER (ANA TITER): ANA Titer 1: 1:40 {titer} — ABNORMAL HIGH

## 2017-06-24 LAB — ANA: Anti Nuclear Antibody(ANA): POSITIVE — AB

## 2017-06-27 ENCOUNTER — Telehealth: Payer: Self-pay

## 2017-06-27 ENCOUNTER — Other Ambulatory Visit: Payer: Self-pay

## 2017-06-27 NOTE — Telephone Encounter (Signed)
Received request from infusion pharmacy, [facility], to fax over latest H&P, office notes, labs to [phone]. Patient has in home Entyvio infusion monthly.

## 2017-07-05 ENCOUNTER — Encounter (HOSPITAL_COMMUNITY): Payer: Medicare Other

## 2017-07-07 ENCOUNTER — Ambulatory Visit (HOSPITAL_BASED_OUTPATIENT_CLINIC_OR_DEPARTMENT_OTHER): Payer: Medicare Other | Admitting: Hematology & Oncology

## 2017-07-07 ENCOUNTER — Other Ambulatory Visit (HOSPITAL_BASED_OUTPATIENT_CLINIC_OR_DEPARTMENT_OTHER): Payer: Medicare Other

## 2017-07-07 VITALS — BP 158/98 | HR 97 | Temp 98.1°F | Resp 18 | Wt 184.0 lb

## 2017-07-07 DIAGNOSIS — K625 Hemorrhage of anus and rectum: Secondary | ICD-10-CM

## 2017-07-07 DIAGNOSIS — K922 Gastrointestinal hemorrhage, unspecified: Secondary | ICD-10-CM | POA: Diagnosis not present

## 2017-07-07 DIAGNOSIS — I808 Phlebitis and thrombophlebitis of other sites: Secondary | ICD-10-CM | POA: Diagnosis not present

## 2017-07-07 DIAGNOSIS — K5190 Ulcerative colitis, unspecified, without complications: Secondary | ICD-10-CM | POA: Diagnosis not present

## 2017-07-07 DIAGNOSIS — D500 Iron deficiency anemia secondary to blood loss (chronic): Secondary | ICD-10-CM

## 2017-07-07 LAB — CMP (CANCER CENTER ONLY)
ALT(SGPT): 26 U/L (ref 10–47)
AST: 27 U/L (ref 11–38)
Albumin: 3.3 g/dL (ref 3.3–5.5)
Alkaline Phosphatase: 74 U/L (ref 26–84)
BUN, Bld: 26 mg/dL — ABNORMAL HIGH (ref 7–22)
CO2: 28 mEq/L (ref 18–33)
Calcium: 9 mg/dL (ref 8.0–10.3)
Chloride: 103 mEq/L (ref 98–108)
Creat: 2.3 mg/dl — ABNORMAL HIGH (ref 0.6–1.2)
Glucose, Bld: 95 mg/dL (ref 73–118)
Potassium: 3.2 mEq/L — ABNORMAL LOW (ref 3.3–4.7)
Sodium: 145 mEq/L (ref 128–145)
Total Bilirubin: 0.4 mg/dl (ref 0.20–1.60)
Total Protein: 7.2 g/dL (ref 6.4–8.1)

## 2017-07-07 LAB — CBC WITH DIFFERENTIAL (CANCER CENTER ONLY)
BASO#: 0 10*3/uL (ref 0.0–0.2)
BASO%: 0.3 % (ref 0.0–2.0)
EOS%: 4.9 % (ref 0.0–7.0)
Eosinophils Absolute: 0.4 10*3/uL (ref 0.0–0.5)
HCT: 30.7 % — ABNORMAL LOW (ref 34.8–46.6)
HGB: 10.3 g/dL — ABNORMAL LOW (ref 11.6–15.9)
LYMPH#: 2.2 10*3/uL (ref 0.9–3.3)
LYMPH%: 29.8 % (ref 14.0–48.0)
MCH: 35.6 pg — ABNORMAL HIGH (ref 26.0–34.0)
MCHC: 33.6 g/dL (ref 32.0–36.0)
MCV: 106 fL — ABNORMAL HIGH (ref 81–101)
MONO#: 0.7 10*3/uL (ref 0.1–0.9)
MONO%: 9.9 % (ref 0.0–13.0)
NEUT#: 4.1 10*3/uL (ref 1.5–6.5)
NEUT%: 55.1 % (ref 39.6–80.0)
Platelets: 281 10*3/uL (ref 145–400)
RBC: 2.89 10*6/uL — ABNORMAL LOW (ref 3.70–5.32)
RDW: 12.9 % (ref 11.1–15.7)
WBC: 7.4 10*3/uL (ref 3.9–10.0)

## 2017-07-07 LAB — FERRITIN: Ferritin: 391 ng/ml — ABNORMAL HIGH (ref 9–269)

## 2017-07-07 LAB — IRON AND TIBC
%SAT: 25 % (ref 21–57)
Iron: 70 ug/dL (ref 41–142)
TIBC: 284 ug/dL (ref 236–444)
UIBC: 214 ug/dL (ref 120–384)

## 2017-07-07 LAB — LACTATE DEHYDROGENASE: LDH: 180 U/L (ref 125–245)

## 2017-07-07 NOTE — Progress Notes (Signed)
Hematology and Oncology Follow Up Visit  [name] [id number] [birth year] 61 y.o. 07/07/2017   Principle Diagnosis:  Recurrent iron deficiency anemia secondary to ulcerative colitis Right cephalic/basilic vein thrombus - positive lupus anticoagulant  Current Therapy:   IV iron as indicated - dose to be given on 03/23/2017    Interim History:  [name] is here today for follow-up.  [name] is doing better Sh does not appear to be having GI bleeding [name] is taking the Entyvio monthly.   I think [name] last got iron back in August. At that time her ferritin was 336 with an iron saturation of 19%.  [name] now is off Xarelto. [name] been on it for several month.  Her last lupus anticoagulant test was negative.   [name] has had no feve. There's bee no cough. [name]'s had no nausea or vomiting. [name]'s had no leg swelling. [name]'s ad no rashes.   overall, her performance status is ECOG 1.ECOG Performance Status: 1 - Symptomatic but completely ambulatory  Medications:  Allergies as of 07/07/2017      Reactions   Morphine Other (See Comments)   Gives me crazy dreams   Sulfonamide Derivatives Itching, Swelling, Rash      Medication List       Accurate as of 07/07/17  8:36 AM. Always use your most recent med list.          BIOTIN PO Take by mouth daily.   calcitRIOL 0.25 MCG capsule Commonly known as:  ROCALTROL Take 0.25 mcg by mouth every other day. Mon, Wed, Fri   DULoxetine 60 MG capsule Commonly known as:  CYMBALTA TAKE 2 CAPSULES BY MOUTH DAILY. PATIENT NEEDS EVAL/OR LAB TESTING BEFORE ADDT'L REFILLS ARE GIVEN   DULoxetine 60 MG capsule Commonly known as:  CYMBALTA TAKE 2 CAPSULES BY MOUTH  DAILY. PATIENT NEEDS  EVAL/OR LAB TESTING BEFORE  ADDT&apos;L REFILLS ARE GIVEN   febuxostat 40 MG tablet Commonly known as:  ULORIC Take 1 tablet (40 mg total) by mouth daily.   folic acid 1 MG tablet Commonly known as:  FOLVITE Take 2 tablets (2 mg total) by mouth daily.     HYDROcodone-acetaminophen 5-325 MG tablet Commonly known as:  NORCO/VICODIN Take 2 tablets by mouth every 4 (four) hours as needed for moderate pain or severe pain.   Melatonin 2.5 MG Caps Take 2.5 mg by mouth at bedtime.   metoprolol succinate 50 MG 24 hr tablet Commonly known as:  TOPROL-XL Take 1 tablet (50 mg total) by mouth daily.   MULTIVITAL Chew Chew 1 tablet by mouth daily. Reported on 03/23/2016   ranitidine 150 MG tablet Commonly known as:  ZANTAC TAKE 1 TABLET BY MOUTH TWO  TIMES DAILY   sodium chloride 0.9 % SOLN 250 mL with vedolizumab 300 MG SOLR 300 mg Inject 300 mg into the vein every 30 (thirty) days.       Allergies:  Allergies  Allergen Reactions  . Morphine Other (See Comments)    Gives me crazy dreams  . Sulfonamide Derivatives Itching, Swelling and Rash    Past Medical History, Surgical history, Social history, and Family History were reviewed and updated.  Review of Systems:  as stated in the interim history  Physical Exam:  weight is 184 lb (83.5 kg). Her oral temperature is 98.1 F (36.7 C). Her blood pressure is 158/98 (abnormal) and her pulse is 97. Her respiration is 18 and oxygen saturation is 100%.   Wt Readings from Last 3 Encounters:  07/07/17 184 lb (83.5 kg)  06/21/17 185 lb (83.9 kg)  06/02/17 181 lb (82.1 kg)    Physical Exam  Constitutional: [name] is oriented to person, place, and time.  HENT:  Head: Normocephalic and atraumatic.  Mouth/Throat: Oropharynx is clear and moist.  Eyes: Pupils are equal, round, and reactive to light. EOM are normal.  Neck: Normal range of motion.  Cardiovascular: Normal rate, regular rhythm and normal heart sounds.   Pulmonary/Chest: Effort normal and breath sounds normal.  Abdominal: Soft. Bowel sounds are normal.  Musculoskeletal: Normal range of motion. [name] exhibits no edema, tenderness or deformity.  Lymphadenopathy:    [name] has no cervical adenopathy.  Neurological: [name] is alert and  oriented to person, place, and time.  Skin: Skin is warm and dry. No rash noted. No erythema.  Psychiatric: [name] has a normal mood and affect. Her behavior is normal. Judgment and thought content normal.  Vitals reviewed.    Lab Results  Component Value Date   WBC 7.4 07/07/2017   HGB 10.3 (L) 07/07/2017   HCT 30.7 (L) 07/07/2017   MCV 106 (H) 07/07/2017   PLT 281 07/07/2017   Lab Results  Component Value Date   FERRITIN 354 (H) 05/26/2017   IRON 55 05/26/2017   TIBC 299 05/26/2017   UIBC 244 05/26/2017   IRONPCTSAT 18 (L) 05/26/2017   Lab Results  Component Value Date   RETICCTPCT 2.5 01/14/2017   RBC 2.89 (L) 07/07/2017   No results found for: KPAFRELGTCHN, LAMBDASER, KAPLAMBRATIO No results found for: IGGSERUM, IGA, IGMSERUM No results found for: Ronnald Ramp, A1GS, Gilford Silvius, MSPIKE, SPEI   Chemistry      Component Value Date/Time   NA 145 07/07/2017 0800   K 3.2 (L) 07/07/2017 0800   CL 103 07/07/2017 0800   CO2 28 07/07/2017 0800   BUN 26 (H) 07/07/2017 0800   CREATININE 2.3 (H) 07/07/2017 0800      Component Value Date/Time   CALCIUM 9.0 07/07/2017 0800   CALCIUM 8.9 04/12/2013 0851   ALKPHOS 74 07/07/2017 0800   AST 27 07/07/2017 0800   ALT 26 07/07/2017 0800   BILITOT 0.40 07/07/2017 0800      Impression and Plan: [name] is a very pleasant 61 yo caucasian female with ulcerative colitis and episodes of GI and rectal bleeding. This has caused iron deficiency.     to see [place] in another 6 weeks.     [name], MD 11/1/20188:36 AM

## 2017-07-08 LAB — RETICULOCYTES: Reticulocyte Count: 2.7 % — ABNORMAL HIGH (ref 0.6–2.6)

## 2017-07-15 ENCOUNTER — Ambulatory Visit (HOSPITAL_COMMUNITY): Payer: Medicare Other

## 2017-07-15 ENCOUNTER — Ambulatory Visit: Payer: Medicare Other | Admitting: Gynecology

## 2017-07-15 ENCOUNTER — Encounter: Payer: Self-pay | Admitting: Gynecology

## 2017-07-15 VITALS — BP 122/76 | Ht 63.0 in | Wt 184.0 lb

## 2017-07-15 DIAGNOSIS — N952 Postmenopausal atrophic vaginitis: Secondary | ICD-10-CM

## 2017-07-15 DIAGNOSIS — Z01411 Encounter for gynecological examination (general) (routine) with abnormal findings: Secondary | ICD-10-CM

## 2017-07-15 DIAGNOSIS — N763 Subacute and chronic vulvitis: Secondary | ICD-10-CM

## 2017-07-15 LAB — WET PREP FOR TRICH, YEAST, CLUE

## 2017-07-15 MED ORDER — CLOBETASOL PROPIONATE 0.05 % EX CREA: TOPICAL_CREAM | CUTANEOUS | 4 refills | Status: DC

## 2017-07-15 NOTE — Addendum Note (Signed)
Addended by: Nelva Nay on: 07/15/2017 03:45 PM   Modules accepted: Orders

## 2017-07-15 NOTE — Patient Instructions (Signed)
Use the prescribed cream for the vulvar irritation.  Follow-up if this continues to be an issue.  Follow-up in 1 year for annual exam, sooner as needed.

## 2017-07-15 NOTE — Progress Notes (Signed)
    [name] 1956/03/06 [id number]        61 y.o.  [id number] for annual gynecologic exam.  Warmer patient of Dr. Toney Rakes  Past medical history,surgical history, problem list, medications, allergies, family history and social history were all reviewed and documented as reviewed in the EPIC chart.  ROS:  Performed with pertinent positives and negatives included in the history, assessment and plan.   Additional significant findings : None   Exam: Caryn Bee assistant Vitals:   07/15/17 1443  BP: 122/76  Weight: 184 lb (83.5 kg)  Height: 5\' 3"  (1.6 m)   Body mass index is 32.59 kg/m.  General appearance:  Normal affect, orientation and appearance. Skin: Grossly normal HEENT: Without gross lesions.  No cervical or supraclavicular adenopathy. Thyroid normal.  Lungs:  Clear without wheezing, rales or rhonchi Cardiac: RR, without RMG Abdominal:  Soft, nontender, without masses, guarding, rebound, organomegaly or hernia Breasts:  Examined lying and sitting without masses, retractions, discharge or axillary adenopathy. Pelvic:  Ext, BUS, Vagina: With atrophic changes.  Generalized rash involving both labia majora and perianal region.  Adnexa: Without masses or tenderness    Anus and perineum: Normal with rash noted.   Assessment/Plan:  61 y.o. [name] female for annual gynecologic exam that is post TAH LSO.  1. History of right ovarian cyst.  Being followed since 2014.  Initial measurement 33 mm mean with normal CA 125.  Has been having ultrasounds every year with stability of the cyst.  Last ultrasound on record 2016 where the measurement was 22 mm mean.  Has remained avascular with each study.  Reviewed with patient overwhelming likelihood this is a benign process.  Options to restudy with ultrasound now or to stop studying as she is asymptomatic reviewed.  Patient comfortable with stop screening. 2. Vulvitis.  Wet prep was negative.  Has history of colectomy for her ulcerative  colitis with reanastomosis.  She does have some issues of fecal incontinence and wears pads daily.  I think this is more of an irritative/allergic vulvitis.  Recommend Temovate 0.05% cream intermittently as needed for irritation.  30 g tube with 4 refills provided.  Patient will follow-up if this does not help with her symptoms.  Will use intermittently. 3. Pap smear 2012.  No Pap smear done today.  No history of significant abnormal Pap smears.  We both agree to stop screening per current screening guidelines based on hysterectomy history. 4. Mammography 12/2016.  Continue with annual mammography when due.  SBE monthly reviewed.  Breast exam normal today. 5. Sigmoidoscopy 2018.  Repeat at their recommended interval. 6. DEXA 2014 normal.  Recommend repeat DEXA at age 84. 6. Health maintenance.  No routine lab work done as patient reports is done elsewhere.  Follow-up 1 year, sooner as needed.   Anastasio Auerbach MD, 3:19 PM 07/15/2017

## 2017-07-16 DIAGNOSIS — K5100 Ulcerative (chronic) pancolitis without complications: Secondary | ICD-10-CM | POA: Diagnosis not present

## 2017-07-20 ENCOUNTER — Telehealth: Payer: Self-pay | Admitting: Family Medicine

## 2017-07-20 DIAGNOSIS — M25551 Pain in right hip: Secondary | ICD-10-CM

## 2017-07-20 NOTE — Telephone Encounter (Signed)
Pt states RT HIP pian no better. Pt req make referral to specialist

## 2017-07-21 ENCOUNTER — Telehealth: Payer: Self-pay | Admitting: Cardiovascular Disease

## 2017-07-21 ENCOUNTER — Ambulatory Visit: Payer: Medicare Other | Admitting: Cardiovascular Disease

## 2017-07-21 ENCOUNTER — Encounter: Payer: Self-pay | Admitting: Cardiovascular Disease

## 2017-07-21 VITALS — BP 170/98 | HR 100 | Ht 63.0 in | Wt 183.2 lb

## 2017-07-21 DIAGNOSIS — I10 Essential (primary) hypertension: Secondary | ICD-10-CM | POA: Diagnosis not present

## 2017-07-21 MED ORDER — TORSEMIDE 20 MG PO TABS: 20.0000 mg | ORAL_TABLET | Freq: Once | ORAL | 3 refills | Status: DC

## 2017-07-21 MED ORDER — METOPROLOL SUCCINATE ER 100 MG PO TB24: 100.0000 mg | ORAL_TABLET | Freq: Every day | ORAL | 3 refills | Status: DC

## 2017-07-21 MED ORDER — POTASSIUM CHLORIDE ER 10 MEQ PO TBCR: 10.0000 meq | EXTENDED_RELEASE_TABLET | Freq: Every day | ORAL | 3 refills | Status: DC

## 2017-07-21 NOTE — Progress Notes (Signed)
Cardiology Office Note   Date:  07/21/2017   ID:  [name], DOB [birth year], MRN [id number]  PCP:  Carollee Herter, Alferd Apa, DO  Cardiologist:   Mertie Moores, MD   Chief Complaint  Patient presents with  . Hypertension   1. Hypertension 2. Ulcerative colitis- s/p total colectomy. 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 Hyperlipidemia 4. Chest pain-stress Myoview study in 2008 was normal   [name] is seen today for a follow up visit. She is seen for Dr. Acie Fredrickson. She has not been seen in about 2 years. She has HTN, ulcerative colitis with extensive GI issues, HLD and chest pain. Remote Myoview back in 2008 was satisfactory.   She comes in today. She is here alone. She is doing well. She says her chest pain is unchanged over the last several years and really nothing that worries her. She has no exertional symptoms. She is not short of breath. She needs her Metoprolol refilled. No shortness of breath. Not as active and her weight is up. She continues to have significant GI issues. Still with some stool leakage and has an internal J pouch. She sees nephrololgy for an elevated creatinine and is due to have her physical soon. She really has no complaint and thinks she is doing well.   Jan. 30, 2015;  [name] is doing OK. We have given her metoprolol for a resting tachycardia. No problems with tachycardia or hypertension at this point.    November 22, 2014:   [name] is a 61 y.o. female who presents for follow up of her HTN . No further episodes of CP  Oct. 20, 2017 Is now on Envacona for  Has a skin infection on her right thumb and thinks its due to the lowered immune system  No CP or dyspnea.  Does not get any regular exercise She does not have a colon.   Has lots of GI issues   Nov. 15, 2018:  BP is elevated  Has been high for several weeks. Has been eating salty foods. - eats out every night .  Eats sandwiches for lunch .  Eats chicken hot dogs.   Eats mac  and cheese.    Has had issues with anemia .  Does not exercise      Past Medical History:  Diagnosis Date  . Abdominal pain    Hx  . Allergy   . Anal stenosis   . Anemia   . Anxiety   . Arthritis   . Asthma    patient does not have inhaler  . Blood in stool    Hx  . Blood in urine    Hx  . Blood transfusion without reported diagnosis   . De Quervain's tenosynovitis   . Depression   . Difficulty urinating    Hx  . Easy bruising   . Esophagitis   . Fainting    History - resolved - due to dehydration  . Fatigue    Hx  . Fibroid    Hx  . Gastric polyp   . GERD (gastroesophageal reflux disease)   . 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 Hemorrhoids, external   . Hemorrhoids, internal   . Herpes, genital    vaginal treated 07/05/12 and pt states is resolved  . History of cervical dysplasia   . History of small bowel obstruction   . Hyperlipidemia  currently no meds  . Hyperparathyroidism   . Hypertension   . Hypokalemia    Hx  . 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 Obesity   . Ovarian cyst   . Poor dental hygiene   . Pulmonary nodule, right    right upper lobe  . Rectal bleeding    Hx  . Rectal pain    Hx  . Renal insufficiency    CKD - stage 2  . RLS (restless legs syndrome)    no meds  . Tooth infection 11/2016   right low  . Ulcerative colitis   . Visual disturbance    wears glasses  . Weakness generalized    Hx - patient denies generalized weakness  . Wears dentures    upper only    Past Surgical History:  Procedure Laterality Date  . ANAL DILATION    . CERVICAL BIOPSY  W/ LOOP ELECTRODE EXCISION    . CHOLECYSTECTOMY    . COLONOSCOPY     [name]  . fatty tumor removed from back     X 2  . HEMORRHOID SURGERY    . ILEOSTOMY CLOSURE    . RESTORATIVE PROCTOCOLECTOMY     with insertion of ileoanal J Pouch with loop ileostomy   . SIGMOIDOSCOPY    . TOTAL ABDOMINAL HYSTERECTOMY  1998   TAH/LSO  . TUBAL LIGATION    . UPPER GASTROINTESTINAL ENDOSCOPY     [name]     Current Outpatient Medications  Medication Sig Dispense Refill  . BIOTIN PO Take by mouth daily.    . calcitRIOL (ROCALTROL) 0.25 MCG capsule Take 0.25 mcg by mouth every other day. Mon, Wed, Fri    . calcium citrate-vitamin D 500-400 MG-UNIT chewable tablet Chew 1 tablet daily by mouth.    . clobetasol cream (TEMOVATE) 0.05 % Apply as needed for irritation 30 g 4  . [employer] VITAMIN E PO Take 1 capsule daily by mouth.    . Cyanocobalamin (VITAMIN B-12 PO) Take 2,500 mg daily by mouth.    . DULoxetine (CYMBALTA) 60 MG capsule TAKE 2 CAPSULES BY MOUTH DAILY. PATIENT NEEDS EVAL/OR LAB TESTING BEFORE ADDT'L REFILLS ARE GIVEN 180 capsule 0  . febuxostat (ULORIC) 40 MG tablet Take 1 tablet (40 mg total) by mouth daily.    . folic acid (FOLVITE) 1 MG tablet Take 2 tablets (2 mg total) by mouth daily. 180 tablet 3  . HYDROcodone-acetaminophen (NORCO/VICODIN) 5-325 MG tablet Take 2 tablets by mouth every 4 (four) hours as needed for moderate pain or severe pain. 90 tablet 0  . Melatonin 5 MG TABS Take 5 mg at bedtime by mouth.    . metoprolol succinate (TOPROL-XL) 50 MG 24 hr tablet Take 1 tablet (50 mg total) by mouth daily. 90 tablet 3  . Multiple Vitamins-Minerals (MULTIVITAL) CHEW Chew 1 tablet by mouth daily. Reported on 03/23/2016    . ranitidine (ZANTAC) 150 MG tablet TAKE 1 TABLET BY MOUTH TWO  TIMES DAILY 180 tablet 0  . sodium chloride 0.9 % SOLN 250 mL with vedolizumab 300 MG SOLR 300 mg Inject 300 mg into the vein every 30 (thirty) days. 1 Dose 12   No current facility-administered medications for this visit.     Allergies:   Morphine and Sulfonamide derivatives    Social History:  The patient  reports that she quit smoking about 38 years ago. Her smoking use  included cigarettes. She has a 4.00 pack-year smoking history. she has never used smokeless  tobacco. She reports that she does not drink alcohol or use drugs.   Family History:  The patient's family history includes Cancer in her maternal uncle and sister; Diabetes in her sister; Heart attack in her father; Heart disease in her mother; Hypertension in her father and mother; Irritable bowel syndrome in her unknown relative; Ulcerative colitis in her daughter and father.    ROS:  Please see the history of present illness.     All other systems are reviewed and negative.   Physical Exam: Blood pressure (!) 170/98, pulse 100, height _0  (1.6 m), weight 183 lb 3.2 oz (83.1 kg), SpO2 94 %.  GEN:  Well nourished, well developed in no acute distress HEENT: Normal NECK: No JVD; No carotid bruits LYMPHATICS: No lymphadenopathy CARDIAC: RR, no murmurs, rubs, gallops RESPIRATORY:  Clear to auscultation without rales, wheezing or rhonchi  ABDOMEN: Soft, non-tender, non-distended MUSCULOSKELETAL:  No edema; No deformity  SKIN: Warm and dry NEUROLOGIC:  Alert and oriented x 3   EKG:  EKG is ordered today.     Recent Labs: 01/15/2017: TSH 2.624 07/07/2017: ALT(SGPT) 26; BUN, Bld 26; Creat 2.3; HGB 10.3; Platelets 281; Potassium 3.2; Sodium 145    Lipid Panel    Component Value Date/Time   CHOL 247 (H) 05/10/2017 0951   TRIG 307.0 (H) 05/10/2017 0951   TRIG 161 (H) 09/19/2006 0940   HDL 45.70 05/10/2017 0951   CHOLHDL 5 05/10/2017 0951   VLDL 61.4 (H) 05/10/2017 0951   LDLCALC 122 (H) 11/22/2014 1017   LDLDIRECT 127.0 05/10/2017 0951      Wt Readings from Last 3 Encounters:  07/21/17 183 lb 3.2 oz (83.1 kg)  07/15/17 184 lb (83.5 kg)  07/07/17 184 lb (83.5 kg)      Other studies Reviewed: Additional studies/ records that were reviewed today include: . Review of the above records demonstrates:    ASSESSMENT AND PLAN:  1. Hypertension -    She needs to greatly improve her diet. Eats lots of salt.  We will add Demadex 20 mg a day and potassium chloride 10 mg a  day.  With her creatinine is 2.3 so I do not think that hydrochlorothiazide will be effective. At some point we may need to have Dr. Burman Foster  weigh in on her antihypertensives given her CKD. The basic metabolic profile in 2 weeks.  I will see her again in 3 months.  2. Ulcerative colitis-  3. Hyperlipidemia -   4. Chest pain-    Current medicines are reviewed at length with the patient today.  The patient does not have concerns regarding medicines.  The following changes have been made:  no change  Labs/ tests ordered today include:  No orders of the defined types were placed in this encounter.    Disposition:   FU with me in 3 months     Signed, Mertie Moores, MD  07/21/2017 2:04 PM    [facility] [address], [place], [place]  [address] Phone: [phone]; Fax: [phone]

## 2017-07-21 NOTE — Telephone Encounter (Signed)
error 

## 2017-07-21 NOTE — Telephone Encounter (Signed)
Referral placed and patient notified.  

## 2017-07-21 NOTE — Patient Instructions (Addendum)
Medication Instructions:  Your physician has recommended you make the following change in your medication:   START Torsemide 20 mg once daily START Kdur (potassium) 10 meq once daily INCREASE Toprol (Metoprolol) to 100 mg once daily   Labwork: Your physician recommends that you return for lab work in: 2 weeks for basic metabolic panel   Testing/Procedures: None Ordered   Follow-Up: Your physician recommends that you schedule a follow-up appointment in: 3 months with Dr. Acie Fredrickson   If you need a refill on your cardiac medications before your next appointment, please call your pharmacy.   Thank you for choosing [facility]! Christen Bame, RN [phone]

## 2017-07-25 ENCOUNTER — Encounter (INDEPENDENT_AMBULATORY_CARE_PROVIDER_SITE_OTHER): Payer: Self-pay | Admitting: Orthopaedic Surgery

## 2017-07-25 ENCOUNTER — Ambulatory Visit (INDEPENDENT_AMBULATORY_CARE_PROVIDER_SITE_OTHER): Payer: Medicare Other | Admitting: Orthopaedic Surgery

## 2017-07-25 DIAGNOSIS — M5441 Lumbago with sciatica, right side: Secondary | ICD-10-CM

## 2017-07-25 MED ORDER — TIZANIDINE HCL 4 MG PO TABS: 4.0000 mg | ORAL_TABLET | Freq: Four times a day (QID) | ORAL | 2 refills | Status: DC | PRN

## 2017-07-25 MED ORDER — PREDNISONE 10 MG (21) PO TBPK
ORAL_TABLET | ORAL | 0 refills | Status: DC
Start: 2017-07-25 — End: 2017-08-31

## 2017-07-25 NOTE — Progress Notes (Signed)
Office Visit Note   Patient: [name]           Date of Birth: June 26, 1956           MRN: [id number] Visit Date: 07/25/2017              Requested by: [address], [place], [place] [address] [place],  [address] PCP: Carollee Herter, Alferd Apa, DO   Assessment & Plan: Visit Diagnoses:  1. Acute [name]-sided low back pain with [name]-sided sciatica     Plan: Impressions lumbar spondylosis and lumbar radiculopathy.  Prescription for prednisone and Zanaflex.  Physical therapy referral.  Questions encouraged and answered.  Follow-up as needed.  Follow-Up Instructions: Return if symptoms worsen or fail to improve.   Orders:  No orders of the defined types were placed in this encounter.  Meds ordered this encounter  Medications  . tiZANidine (ZANAFLEX) 4 MG tablet    Sig: Take 1 tablet (4 mg total) every 6 (six) hours as needed by mouth for muscle spasms.    Dispense:  30 tablet    Refill:  2  . predniSONE (STERAPRED UNI-PAK 21 TAB) 10 MG (21) TBPK tablet    Sig: Take as directed    Dispense:  21 tablet    Refill:  0      Procedures: No procedures performed   Clinical Data: No additional findings.   Subjective: Chief Complaint  Patient presents with  . [name] Hip - Pain    Patient is a 61 year old female comes in with [name] hip and foot pain with numbness and tingling for 3-4 months.  The pain is worse when at school.  She denies any groin pain.  She does have a history of gout.  Pain is worse with activity better with rest.  Denies any constitutional symptoms.    Review of Systems  Constitutional: Negative.   HENT: Negative.   Eyes: Negative.   Respiratory: Negative.   Cardiovascular: Negative.   Endocrine: Negative.   Musculoskeletal: Negative.   Neurological: Negative.   Hematological: Negative.   Psychiatric/Behavioral: Negative.   All other systems reviewed and are negative.    Objective: Vital Signs: There were no vitals taken  for this visit.  Physical Exam  Constitutional: She is oriented to person, place, and time. She appears well-developed and well-nourished.  HENT:  Head: Normocephalic and atraumatic.  Eyes: EOM are normal.  Neck: Neck supple.  Pulmonary/Chest: Effort normal.  Abdominal: Soft.  Neurological: She is alert and oriented to person, place, and time.  Skin: Skin is warm. Capillary refill takes less than 2 seconds.  Psychiatric: She has a normal mood and affect. Her behavior is normal. Judgment and thought content normal.  Nursing note and vitals reviewed.   Ortho Exam [name] hip exam shows painless rotation.  Benign hip exam.  Negative straight leg. Specialty Comments:  No specialty comments available.  Imaging: No results found.   PMFS History: Patient Active Problem List   Diagnosis Date Noted  . Low back pain 06/21/2017  . Acute [name]-sided low back pain with [name]-sided sciatica 06/21/2017  . Idiopathic gout of [name] ankle 06/21/2017  . Superficial thrombophlebitis of [name] upper extremity 03/23/2017  . Acute basilic vein thrombosis, [name] 02/16/2017  . Symptomatic anemia 01/15/2017  . 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 Rectal pain 12/01/2015  . IBD (inflammatory bowel disease) 10/24/2015  . Anal stricture 10/24/2015  . Gout of foot 10/29/2014  . Ovarian cyst,  [name] 06/12/2014  . Acute on chronic renal failure ([place]) 04/02/2013  . Hydrosalpinx 02/14/2013  . Hyperparathyroidism ([place]) 09/18/2012  . HSV (herpes simplex virus) anogenital infection 06/28/2012  . CKD (chronic kidney disease), stage IV ([place]) 06/17/2012  . LLQ abdominal pain 06/22/2011  . RESTLESS LEGS SYNDROME 08/05/2009  . PULMONARY NODULE, [name] UPPER LOBE 02/28/2009  . TOBACCO ABUSE, HX OF 02/11/2009  . PAIN IN JOINT, MULTIPLE SITES 02/04/2009  . Nonspecific (abnormal) findings on radiological and other examination of body structure 12/23/2008  . CT, CHEST, ABNORMAL 12/23/2008  . HEMANGIOMA SIMPLEX  12/16/2008  . Pain in joint, pelvic region and thigh 12/16/2008  . SKIN RASH 12/16/2008  . TINEA CORPORIS 04/18/2008  . LATERAL EPICONDYLITIS, [name] 04/18/2008  . OBESITY, UNSPECIFIED 02/05/2008  . ANEMIA 02/05/2008  . ANAL STENOSIS 02/05/2008  . Diarrhea, unspecified 02/05/2008  . Personal history of other endocrine, metabolic, and immunity disorders 02/05/2008  . Personal history of other diseases of digestive system 02/05/2008  . SYNCOPE AND COLLAPSE 02/02/2008  . HYPONATREMIA 12/15/2007  . DEHYDRATION 12/15/2007  . HYPOKALEMIA 12/15/2007  . ANEMIA-IRON DEFICIENCY 12/15/2007  . LEUKOCYTOSIS 12/15/2007  . ANXIETY 12/15/2007  . RENAL INSUFFICIENCY 11/16/2007  . TINNITUS NOS 05/25/2007  . DE QUERVAIN'S TENOSYNOVITIS 05/25/2007  . Hyperlipidemia LDL goal <100 10/23/2006  . Depression 10/23/2006  . Benign essential HTN 10/23/2006  . GERD 10/23/2006  . Gastro-esophageal reflux disease without esophagitis 10/23/2006  . Ulcerative colitis without complications ([place]) 46/56/8127  . GASTRIC POLYP 11/16/2001  . ESOPHAGITIS 11/16/2001   Past Medical History:  Diagnosis Date  . Abdominal pain    Hx  . Allergy   . Anal stenosis   . Anemia   . Anxiety   . Arthritis   . Asthma    patient does not have inhaler  . Blood in stool    Hx  . Blood in urine    Hx  . Blood transfusion without reported diagnosis   . De Quervain's tenosynovitis   . Depression   . Difficulty urinating    Hx  . Easy bruising   . Esophagitis   . Fainting    History - resolved - due to dehydration  . Fatigue    Hx  . Fibroid    Hx  . Gastric polyp   . GERD (gastroesophageal reflux disease)   . 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 Hemorrhoids, external   . Hemorrhoids, internal   . Herpes, genital    vaginal treated 07/05/12 and pt states is resolved  . History of cervical dysplasia   . History of small bowel obstruction   . Hyperlipidemia    currently no meds  .  Hyperparathyroidism   . Hypertension   . Hypokalemia    Hx  . 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 Obesity   . Ovarian cyst   . Poor dental hygiene   . Pulmonary nodule, [name]    [name] upper lobe  . Rectal bleeding    Hx  . Rectal pain    Hx  . Renal insufficiency    CKD - stage 2  . RLS (restless legs syndrome)    no meds  . Tooth infection 11/2016   [name] low  . Ulcerative colitis   . Visual disturbance    wears glasses  . Weakness generalized    Hx -  patient denies generalized weakness  . Wears dentures    upper only    Family History  Problem Relation Age of Onset  . Ulcerative colitis Father   . Hypertension Father   . Heart attack Father   . Hypertension Mother   . Heart disease Mother        s/p pci  . Ulcerative colitis Daughter   . Irritable bowel syndrome Unknown        grandchildren  . Diabetes Sister   . Cancer Sister        uterine  . Cancer Maternal Uncle        LUNG  . Colon cancer Neg Hx   . Esophageal cancer Neg Hx   . Stomach cancer Neg Hx   . Rectal cancer Neg Hx     Past Surgical History:  Procedure Laterality Date  . ANAL DILATION    . CERVICAL BIOPSY  W/ LOOP ELECTRODE EXCISION    . CHOLECYSTECTOMY    . COLONOSCOPY     [name]  . fatty tumor removed from back     X 2  . HEMORRHOID SURGERY    . ILEOSTOMY CLOSURE    . RESTORATIVE PROCTOCOLECTOMY     with insertion of ileoanal J Pouch with loop ileostomy  . SIGMOIDOSCOPY    . TOTAL ABDOMINAL HYSTERECTOMY  1998   TAH/LSO  . TUBAL LIGATION    . UPPER GASTROINTESTINAL ENDOSCOPY     Olevia Perches   Social History   Occupational History  . Not on file  Tobacco Use  . Smoking status: Former Smoker    Packs/day: 1.00    Years: 4.00    Pack years: 4.00    Types: Cigarettes    Last attempt to quit: 05/21/1979    Years since quitting: 38.2  . Smokeless tobacco: Never Used  Substance and  Sexual Activity  . Alcohol use: No    Alcohol/week: 0.0 oz  . Drug use: No  . Sexual activity: Yes    Birth control/protection: Post-menopausal    Comment: 1st intercourse 61 yo-Fewer than 5 partners

## 2017-07-27 ENCOUNTER — Other Ambulatory Visit: Payer: Self-pay | Admitting: Family Medicine

## 2017-08-04 ENCOUNTER — Other Ambulatory Visit: Payer: Medicare Other

## 2017-08-08 ENCOUNTER — Telehealth: Payer: Self-pay | Admitting: Family Medicine

## 2017-08-08 DIAGNOSIS — M109 Gout, unspecified: Secondary | ICD-10-CM

## 2017-08-08 MED ORDER — FEBUXOSTAT 40 MG PO TABS: 40.0000 mg | ORAL_TABLET | Freq: Every day | ORAL | 0 refills | Status: DC

## 2017-08-08 NOTE — Telephone Encounter (Signed)
Left detailed message that rx was sent in.

## 2017-08-08 NOTE — Telephone Encounter (Signed)
Copied from [place] [id number]. Topic: General - Other >> Aug 08, 2017  4:34 PM Darl Householder, RMA wrote: Reason for CRM: Medication refill request for Uloric 40 mg to be sent to [facility]

## 2017-08-10 DIAGNOSIS — N183 Chronic kidney disease, stage 3 (moderate): Secondary | ICD-10-CM | POA: Diagnosis not present

## 2017-08-10 DIAGNOSIS — D631 Anemia in chronic kidney disease: Secondary | ICD-10-CM | POA: Diagnosis not present

## 2017-08-10 DIAGNOSIS — N179 Acute kidney failure, unspecified: Secondary | ICD-10-CM | POA: Diagnosis not present

## 2017-08-10 DIAGNOSIS — I129 Hypertensive chronic kidney disease with stage 1 through stage 4 chronic kidney disease, or unspecified chronic kidney disease: Secondary | ICD-10-CM | POA: Diagnosis not present

## 2017-08-10 DIAGNOSIS — R768 Other specified abnormal immunological findings in serum: Secondary | ICD-10-CM | POA: Diagnosis not present

## 2017-08-10 DIAGNOSIS — D509 Iron deficiency anemia, unspecified: Secondary | ICD-10-CM | POA: Diagnosis not present

## 2017-08-12 ENCOUNTER — Encounter: Payer: Self-pay | Admitting: Hematology & Oncology

## 2017-08-12 ENCOUNTER — Other Ambulatory Visit: Payer: Self-pay | Admitting: Gastroenterology

## 2017-08-12 ENCOUNTER — Telehealth: Payer: Self-pay | Admitting: Gastroenterology

## 2017-08-12 DIAGNOSIS — K91850 Pouchitis: Secondary | ICD-10-CM

## 2017-08-12 MED ORDER — METRONIDAZOLE 500 MG PO TABS: 500.0000 mg | ORAL_TABLET | Freq: Two times a day (BID) | ORAL | 0 refills | Status: AC

## 2017-08-12 NOTE — Telephone Encounter (Signed)
Patient called has started back up with the bloody, diarrhea since Wednesday. No fever. She has not tried any imodium. She has been having her home infusion of Entyvio monthly. Next infusion is due 12/10.

## 2017-08-12 NOTE — Telephone Encounter (Signed)
Called patient - she has had increasing loose stools recently. She had been on a prolonged course of tinidazole and did quite well for a period of time, I think this helped her chronic pouchitis. She has had C diff in the past however. She has responded to flagyl in the past as well.  Recommend she come in for a stool study for C Diff, however given we are heading into the weekend now with snow storm, she may not be able to do this for a few days. Will give her flagyl 500mg  BID to treat pouchitis initially, if she tests positive for C diff will increase dose to TID or switch to vancomycin if no better. May also consider another prolonged course of tinidazole. She agreed.

## 2017-08-18 ENCOUNTER — Other Ambulatory Visit: Payer: Self-pay | Admitting: Family Medicine

## 2017-08-18 DIAGNOSIS — F329 Major depressive disorder, single episode, unspecified: Secondary | ICD-10-CM

## 2017-08-18 DIAGNOSIS — K5100 Ulcerative (chronic) pancolitis without complications: Secondary | ICD-10-CM | POA: Diagnosis not present

## 2017-08-18 DIAGNOSIS — F32A Depression, unspecified: Secondary | ICD-10-CM

## 2017-08-29 ENCOUNTER — Other Ambulatory Visit: Payer: Self-pay | Admitting: Family Medicine

## 2017-08-29 DIAGNOSIS — M109 Gout, unspecified: Secondary | ICD-10-CM

## 2017-08-31 ENCOUNTER — Other Ambulatory Visit (HOSPITAL_COMMUNITY): Payer: Self-pay | Admitting: Nephrology

## 2017-08-31 DIAGNOSIS — N183 Chronic kidney disease, stage 3 unspecified: Secondary | ICD-10-CM

## 2017-08-31 DIAGNOSIS — I10 Essential (primary) hypertension: Secondary | ICD-10-CM

## 2017-08-31 DIAGNOSIS — N289 Disorder of kidney and ureter, unspecified: Secondary | ICD-10-CM

## 2017-08-31 DIAGNOSIS — N179 Acute kidney failure, unspecified: Secondary | ICD-10-CM

## 2017-09-01 ENCOUNTER — Other Ambulatory Visit: Payer: Self-pay | Admitting: Radiology

## 2017-09-02 ENCOUNTER — Ambulatory Visit (HOSPITAL_COMMUNITY)
Admission: RE | Admit: 2017-09-02 | Discharge: 2017-09-02 | Disposition: A | Discharge status: Home / Self Care | Payer: Medicare Other | Source: Ambulatory Visit | Attending: Nephrology | Admitting: Nephrology

## 2017-09-02 ENCOUNTER — Encounter (HOSPITAL_COMMUNITY): Payer: Medicare Other

## 2017-09-02 ENCOUNTER — Encounter (HOSPITAL_COMMUNITY): Payer: Self-pay

## 2017-09-02 DIAGNOSIS — E785 Hyperlipidemia, unspecified: Secondary | ICD-10-CM | POA: Diagnosis not present

## 2017-09-02 DIAGNOSIS — Z6832 Body mass index (BMI) 32.0-32.9, adult: Secondary | ICD-10-CM | POA: Diagnosis not present

## 2017-09-02 DIAGNOSIS — I10 Essential (primary) hypertension: Secondary | ICD-10-CM

## 2017-09-02 DIAGNOSIS — Z833 Family history of diabetes mellitus: Secondary | ICD-10-CM | POA: Diagnosis not present

## 2017-09-02 DIAGNOSIS — Z79899 Other long term (current) drug therapy: Secondary | ICD-10-CM | POA: Insufficient documentation

## 2017-09-02 DIAGNOSIS — H9192 Unspecified hearing loss, left ear: Secondary | ICD-10-CM | POA: Insufficient documentation

## 2017-09-02 DIAGNOSIS — Z9049 Acquired absence of other specified parts of digestive tract: Secondary | ICD-10-CM | POA: Insufficient documentation

## 2017-09-02 DIAGNOSIS — Z8249 Family history of ischemic heart disease and other diseases of the circulatory system: Secondary | ICD-10-CM | POA: Insufficient documentation

## 2017-09-02 DIAGNOSIS — K219 Gastro-esophageal reflux disease without esophagitis: Secondary | ICD-10-CM | POA: Insufficient documentation

## 2017-09-02 DIAGNOSIS — Z90710 Acquired absence of both cervix and uterus: Secondary | ICD-10-CM | POA: Insufficient documentation

## 2017-09-02 DIAGNOSIS — Z8049 Family history of malignant neoplasm of other genital organs: Secondary | ICD-10-CM | POA: Insufficient documentation

## 2017-09-02 DIAGNOSIS — G2581 Restless legs syndrome: Secondary | ICD-10-CM | POA: Insufficient documentation

## 2017-09-02 DIAGNOSIS — Z8379 Family history of other diseases of the digestive system: Secondary | ICD-10-CM | POA: Insufficient documentation

## 2017-09-02 DIAGNOSIS — I129 Hypertensive chronic kidney disease with stage 1 through stage 4 chronic kidney disease, or unspecified chronic kidney disease: Secondary | ICD-10-CM | POA: Diagnosis not present

## 2017-09-02 DIAGNOSIS — E669 Obesity, unspecified: Secondary | ICD-10-CM | POA: Insufficient documentation

## 2017-09-02 DIAGNOSIS — K5190 Ulcerative colitis, unspecified, without complications: Secondary | ICD-10-CM | POA: Diagnosis not present

## 2017-09-02 DIAGNOSIS — Z885 Allergy status to narcotic agent status: Secondary | ICD-10-CM | POA: Diagnosis not present

## 2017-09-02 DIAGNOSIS — Z882 Allergy status to sulfonamides status: Secondary | ICD-10-CM | POA: Insufficient documentation

## 2017-09-02 DIAGNOSIS — Z87891 Personal history of nicotine dependence: Secondary | ICD-10-CM | POA: Insufficient documentation

## 2017-09-02 DIAGNOSIS — Z87410 Personal history of cervical dysplasia: Secondary | ICD-10-CM | POA: Diagnosis not present

## 2017-09-02 DIAGNOSIS — N179 Acute kidney failure, unspecified: Secondary | ICD-10-CM | POA: Diagnosis not present

## 2017-09-02 DIAGNOSIS — N183 Chronic kidney disease, stage 3 unspecified: Secondary | ICD-10-CM

## 2017-09-02 DIAGNOSIS — Z90721 Acquired absence of ovaries, unilateral: Secondary | ICD-10-CM | POA: Insufficient documentation

## 2017-09-02 DIAGNOSIS — F419 Anxiety disorder, unspecified: Secondary | ICD-10-CM | POA: Insufficient documentation

## 2017-09-02 DIAGNOSIS — N289 Disorder of kidney and ureter, unspecified: Secondary | ICD-10-CM

## 2017-09-02 DIAGNOSIS — F329 Major depressive disorder, single episode, unspecified: Secondary | ICD-10-CM | POA: Diagnosis not present

## 2017-09-02 DIAGNOSIS — E213 Hyperparathyroidism, unspecified: Secondary | ICD-10-CM | POA: Insufficient documentation

## 2017-09-02 DIAGNOSIS — J45909 Unspecified asthma, uncomplicated: Secondary | ICD-10-CM | POA: Insufficient documentation

## 2017-09-02 DIAGNOSIS — Z87448 Personal history of other diseases of urinary system: Secondary | ICD-10-CM | POA: Diagnosis not present

## 2017-09-02 LAB — CBC
HCT: 32.9 % — ABNORMAL LOW (ref 36.0–46.0)
Hemoglobin: 10.8 g/dL — ABNORMAL LOW (ref 12.0–15.0)
MCH: 33.5 pg (ref 26.0–34.0)
MCHC: 32.8 g/dL (ref 30.0–36.0)
MCV: 102.2 fL — ABNORMAL HIGH (ref 78.0–100.0)
Platelets: 356 10*3/uL (ref 150–400)
RBC: 3.22 MIL/uL — ABNORMAL LOW (ref 3.87–5.11)
RDW: 14.4 % (ref 11.5–15.5)
WBC: 8.1 10*3/uL (ref 4.0–10.5)

## 2017-09-02 LAB — PROTIME-INR
INR: 0.95
Prothrombin Time: 12.6 seconds (ref 11.4–15.2)

## 2017-09-02 LAB — APTT: aPTT: 23 seconds — ABNORMAL LOW (ref 24–36)

## 2017-09-02 MED ORDER — LIDOCAINE HCL (PF) 1 % IJ SOLN
INTRAMUSCULAR | Status: AC
  Filled 2017-09-02: qty 30

## 2017-09-02 MED ORDER — NALOXONE HCL 0.4 MG/ML IJ SOLN
INTRAMUSCULAR | Status: AC
  Filled 2017-09-02: qty 1

## 2017-09-02 MED ORDER — FENTANYL CITRATE (PF) 100 MCG/2ML IJ SOLN
INTRAMUSCULAR | Status: AC
  Filled 2017-09-02: qty 4

## 2017-09-02 MED ORDER — MIDAZOLAM HCL 2 MG/2ML IJ SOLN
INTRAMUSCULAR | Status: AC
  Filled 2017-09-02: qty 4

## 2017-09-02 MED ORDER — MIDAZOLAM HCL 2 MG/2ML IJ SOLN
INTRAMUSCULAR | Status: AC | PRN
  Administered 2017-09-02 (×2): 1 mg via INTRAVENOUS

## 2017-09-02 MED ORDER — GELATIN ABSORBABLE 12-7 MM EX MISC
CUTANEOUS | Status: AC
  Filled 2017-09-02: qty 1

## 2017-09-02 MED ORDER — FLUMAZENIL 0.5 MG/5ML IV SOLN
INTRAVENOUS | Status: AC
  Filled 2017-09-02: qty 5

## 2017-09-02 MED ORDER — SODIUM CHLORIDE 0.9 % IV SOLN
INTRAVENOUS | Status: DC
  Administered 2017-09-02: 08:00:00 via INTRAVENOUS

## 2017-09-02 MED ORDER — FENTANYL CITRATE (PF) 100 MCG/2ML IJ SOLN
INTRAMUSCULAR | Status: AC | PRN
  Administered 2017-09-02 (×2): 50 ug via INTRAVENOUS

## 2017-09-02 NOTE — Procedures (Signed)
Interventional Radiology Procedure Note  Procedure: US guided left medical renal biopsy.  2 x 74B core  Complications: None Recommendations:  - Ok to shower tomorrow - Do not submerge for 7 days - Routine wound care - advance diet - dc after 2 hours with goals met   Signed,  Dulcy Fanny. Earleen Newport, DO

## 2017-09-02 NOTE — H&P (Signed)
Chief Complaint: Patient was seen in consultation today for random renal biopsy at the request of Coladonato,Joseph  Referring Physician(s): [place]  Supervising Physician: Corrie Mckusick  Patient Status: [facility]  History of Present Illness: [name] is a 61 y.o. female   Hx ulcerative colitis Multiple surgeries  Acute on chronic renal disease stage 3 Worsening rising creatinine HTN Edema Scheduled now for random renal biopsy per Dr Servando Salina   Past Medical History:  Diagnosis Date  . Abdominal pain    Hx  . Allergy   . Anal stenosis   . Anemia   . Anxiety   . Arthritis   . Asthma    patient does not have inhaler  . Blood in stool    Hx  . Blood in urine    Hx  . Blood transfusion without reported diagnosis   . De Quervain's tenosynovitis   . Depression   . Difficulty urinating    Hx  . Easy bruising   . Esophagitis   . Fainting    History - resolved - due to dehydration  . Fatigue    Hx  . Fibroid    Hx  . Gastric polyp   . GERD (gastroesophageal reflux disease)   . 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 Hemorrhoids, external   . Hemorrhoids, internal   . Herpes, genital    vaginal treated 07/05/12 and pt states is resolved  . History of cervical dysplasia   . History of small bowel obstruction   . Hyperlipidemia    currently no meds  . Hyperparathyroidism   . Hypertension   . Hypokalemia    Hx  . 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 Obesity   . Ovarian cyst   . Poor dental hygiene   . Pulmonary nodule, right    right upper lobe  . Rectal bleeding    Hx  . Rectal pain    Hx  . Renal insufficiency    CKD - stage 2  . RLS (restless legs syndrome)    no meds  . Tooth infection 11/2016   right low  . Ulcerative colitis   . Visual disturbance    wears glasses  . Weakness generalized    Hx -  patient denies generalized weakness  . Wears dentures    upper only    Past Surgical History:  Procedure Laterality Date  . ANAL DILATION    . CERVICAL BIOPSY  W/ LOOP ELECTRODE EXCISION    . CHOLECYSTECTOMY    . COLONOSCOPY     [name]  . fatty tumor removed from back     X 2  . HEMORRHOID SURGERY    . ILEOSTOMY CLOSURE    . RESTORATIVE PROCTOCOLECTOMY     with insertion of ileoanal J Pouch with loop ileostomy  . SIGMOIDOSCOPY    . TOTAL ABDOMINAL HYSTERECTOMY  1998   TAH/LSO  . TUBAL LIGATION    . UPPER GASTROINTESTINAL ENDOSCOPY     [name]    Allergies: Morphine and Sulfonamide derivatives  Medications: Prior to Admission medications   Medication Sig Start Date End Date Taking? Authorizing Provider  BIOTIN PO Take 1 tablet by mouth daily.    Yes [provider]  calcitRIOL (ROCALTROL) 0.25 MCG capsule Take 0.25 mcg by mouth 3 (three) times a week. [place],  Wed, Fri   Yes [provider]  Cholecalciferol (VITAMIN D-3) 5000 units TABS Take 1 tablet by mouth daily.   Yes [provider]  DULoxetine (CYMBALTA) 60 MG capsule TAKE 2 CAPSULES BY MOUTH  DAILY 08/18/17  Yes Roma Schanz R, DO  folic acid (FOLVITE) 1 MG tablet Take 2 tablets (2 mg total) by mouth daily. 05/26/17  Yes [name], MD  HYDROcodone-acetaminophen (NORCO/VICODIN) 5-325 MG tablet Take 2 tablets by mouth every 4 (four) hours as needed for moderate pain or severe pain. 03/23/17  Yes Ann Held, DO  Melatonin 5 MG TABS Take 5 mg at bedtime by mouth.   Yes [provider]  metoprolol succinate (TOPROL-XL) 100 MG 24 hr tablet Take 1 tablet (100 mg total) daily by mouth. Take with or immediately following a meal. 07/21/17 10/19/17 Yes Nahser, Wonda Cheng, MD  potassium chloride 20 MEQ/15ML (10%) SOLN Take 2 mEq by mouth daily. 1.5 ml daily = 2 mEq daily   Yes [provider]  ranitidine (ZANTAC) 150 MG tablet TAKE 1 TABLET BY MOUTH TWO  TIMES DAILY  04/05/17  Yes Roma Schanz R, DO  Simethicone (GAS-X PO) Take 1 tablet by mouth 2 (two) times daily.   Yes [provider]  torsemide (DEMADEX) 20 MG tablet Take 20 mg by mouth daily.   Yes [provider]  ULORIC 40 MG tablet TAKE 1 TABLET BY MOUTH  DAILY 08/29/17  Yes Roma Schanz R, DO  vitamin B-12 (CYANOCOBALAMIN) 100 MCG tablet Take 100 mcg by mouth daily.    Yes [provider]     Family History  Problem Relation Age of Onset  . Ulcerative colitis Father   . Hypertension Father   . Heart attack Father   . Hypertension Mother   . Heart disease Mother        s/p pci  . Ulcerative colitis Daughter   . Irritable bowel syndrome Unknown        grandchildren  . Diabetes Sister   . Cancer Sister        uterine  . Cancer Maternal Uncle        LUNG  . Colon cancer Neg Hx   . Esophageal cancer Neg Hx   . Stomach cancer Neg Hx   . Rectal cancer Neg Hx     Social History   Socioeconomic History  . Marital status: Divorced    Spouse name: None  . Number of children: None  . Years of education: None  . Highest education level: None  Social Needs  . Financial resource strain: None  . Food insecurity - worry: None  . Food insecurity - inability: None  . Transportation needs - medical: None  . Transportation needs - non-medical: None  Occupational History  . None  Tobacco Use  . Smoking status: Former Smoker    Packs/day: 1.00    Years: 4.00    Pack years: 4.00    Types: Cigarettes    Last attempt to quit: 05/21/1979    Years since quitting: 38.3  . Smokeless tobacco: Never Used  Substance and Sexual Activity  . Alcohol use: No    Alcohol/week: 0.0 oz  . Drug use: No  . Sexual activity: Yes    Birth control/protection: Post-menopausal    Comment: 1st intercourse 61 yo-Fewer than 5 partners  Other Topics Concern  . None  Social History Narrative  . None    Review of Systems:  A 12 point ROS discussed and pertinent positives  are indicated in the HPI above.  All other systems are negative.  Review of Systems  Constitutional: Negative for activity change, fatigue and unexpected weight change.  Respiratory: Negative for cough and shortness of breath.   Neurological: Negative for weakness.  Psychiatric/Behavioral: Negative for behavioral problems and confusion.    Vital Signs: BP 119/82 (BP Location: Right Arm)   Pulse 85   Temp 97.7 F (36.5 C) (Oral)   Ht 5\' 3"  (1.6 m)   Wt 185 lb (83.9 kg)   SpO2 100%   BMI 32.77 kg/m   Physical Exam  Constitutional: She is oriented to person, place, and time.  Cardiovascular: Normal rate, regular rhythm and normal heart sounds.  Pulmonary/Chest: Effort normal and breath sounds normal.  Abdominal: Soft. Bowel sounds are normal.  Musculoskeletal: Normal range of motion.  Neurological: She is alert and oriented to person, place, and time.  Skin: Skin is warm and dry.  Psychiatric: She has a normal mood and affect. Her behavior is normal. Judgment and thought content normal.  Nursing note and vitals reviewed.   Imaging: No results found.  Labs:  CBC: Recent Labs    04/22/17 1440 04/27/17 0929 05/26/17 0751 07/07/17 0800  WBC 11.0* 7.8 8.2 7.4  HGB 8.2 Repeated and verified X2.* 8.3* 9.6* 10.3*  HCT 24.9 Repeated and verified X2.* 25.5* 28.7* 30.7*  PLT 373.0 320 307 281    COAGS: No results for input(s): INR, APTT in the last 8760 hours.  BMP: Recent Labs    01/14/17 1924  02/14/17 1439 05/10/17 0951 06/21/17 1201 07/07/17 0800  NA 139   < > 136 135 137 145  K 3.7   < > 3.2* 3.9 3.6 3.2*  CL 106   < > 102 103 104 103  CO2 24   < > 26 23 26 28   GLUCOSE 109*   < > 97 108* 104* 95  BUN 33*   < > 28* 35* 35* 26*  CALCIUM 8.7*   < > 10.1 9.7 9.4 9.0  CREATININE 2.41*   < > 2.0* 2.27* 2.07* 2.3*  GFRNONAA 20*  --   --   --   --   --   GFRAA 23*  --   --   --   --   --    < > = values in this interval not displayed.    LIVER FUNCTION  TESTS: Recent Labs    02/14/17 1439 05/10/17 0951 06/21/17 1201 07/07/17 0800  BILITOT 0.60 0.3 0.2 0.40  AST 30 23 19 27   ALT 28 20 17 26   ALKPHOS 73 63 56 74  PROT 8.0 7.3 7.0 7.2  ALBUMIN 3.6 4.1 3.8 3.3    TUMOR MARKERS: No results for input(s): AFPTM, CEA, CA199, CHROMGRNA in the last 8760 hours.  Assessment and Plan:  A/CKD 3 Worsening creatinine Scheduled for random renal bx Risks and benefits discussed with the patient including, but not limited to bleeding, infection, damage to adjacent structures or low yield requiring additional tests. All of the patient's questions were answered, patient is agreeable to proceed. Consent signed and in chart.   Thank you for this interesting consult.  I greatly enjoyed meeting Jeffie Widdowson and look forward to participating in their care.  A copy of this report was sent to the requesting provider on this date.  Electronically Signed: Lavonia Drafts, PA-C 09/02/2017, 7:26 AM   I spent a total  of  30 Minutes   in face to face in clinical consultation, greater than 50% of which was counseling/coordinating care for random renal bx

## 2017-09-02 NOTE — Sedation Documentation (Signed)
Patient denies pain and is resting comfortably.  

## 2017-09-02 NOTE — Discharge Instructions (Signed)

## 2017-09-07 ENCOUNTER — Other Ambulatory Visit: Payer: Self-pay

## 2017-09-07 MED ORDER — RANITIDINE HCL 150 MG PO TABS: 150.0000 mg | ORAL_TABLET | Freq: Two times a day (BID) | ORAL | 0 refills | Status: DC

## 2017-09-08 ENCOUNTER — Other Ambulatory Visit (HOSPITAL_BASED_OUTPATIENT_CLINIC_OR_DEPARTMENT_OTHER): Payer: Medicare Other

## 2017-09-08 ENCOUNTER — Encounter: Payer: Self-pay | Admitting: Family Medicine

## 2017-09-08 ENCOUNTER — Other Ambulatory Visit: Payer: Self-pay | Admitting: Family Medicine

## 2017-09-08 ENCOUNTER — Other Ambulatory Visit: Payer: Self-pay

## 2017-09-08 ENCOUNTER — Ambulatory Visit (HOSPITAL_BASED_OUTPATIENT_CLINIC_OR_DEPARTMENT_OTHER): Payer: Medicare Other | Admitting: Hematology & Oncology

## 2017-09-08 ENCOUNTER — Telehealth: Payer: Self-pay | Admitting: *Deleted

## 2017-09-08 VITALS — BP 80/62 | HR 101 | Temp 97.7°F | Resp 20 | Wt 182.5 lb

## 2017-09-08 DIAGNOSIS — D500 Iron deficiency anemia secondary to blood loss (chronic): Secondary | ICD-10-CM

## 2017-09-08 DIAGNOSIS — M10071 Idiopathic gout, right ankle and foot: Secondary | ICD-10-CM

## 2017-09-08 LAB — IRON AND TIBC
%SAT: 23 % (ref 21–57)
Iron: 70 ug/dL (ref 41–142)
TIBC: 309 ug/dL (ref 236–444)
UIBC: 239 ug/dL (ref 120–384)

## 2017-09-08 LAB — CMP (CANCER CENTER ONLY)
ALT(SGPT): 22 U/L (ref 10–47)
AST: 25 U/L (ref 11–38)
Albumin: 3.5 g/dL (ref 3.3–5.5)
Alkaline Phosphatase: 89 U/L — ABNORMAL HIGH (ref 26–84)
BUN, Bld: 49 mg/dL — ABNORMAL HIGH (ref 7–22)
CO2: 28 mEq/L (ref 18–33)
Calcium: 9.9 mg/dL (ref 8.0–10.3)
Chloride: 97 mEq/L — ABNORMAL LOW (ref 98–108)
Creat: 3.1 mg/dl (ref 0.6–1.2)
Glucose, Bld: 107 mg/dL (ref 73–118)
Potassium: 3.7 mEq/L (ref 3.3–4.7)
Sodium: 142 mEq/L (ref 128–145)
Total Bilirubin: 0.5 mg/dl (ref 0.20–1.60)
Total Protein: 8.4 g/dL — ABNORMAL HIGH (ref 6.4–8.1)

## 2017-09-08 LAB — CBC WITH DIFFERENTIAL (CANCER CENTER ONLY)
BASO#: 0 10*3/uL (ref 0.0–0.2)
BASO%: 0.3 % (ref 0.0–2.0)
EOS%: 3.2 % (ref 0.0–7.0)
Eosinophils Absolute: 0.3 10*3/uL (ref 0.0–0.5)
HCT: 33.1 % — ABNORMAL LOW (ref 34.8–46.6)
HGB: 11.2 g/dL — ABNORMAL LOW (ref 11.6–15.9)
LYMPH#: 2.7 10*3/uL (ref 0.9–3.3)
LYMPH%: 27.1 % (ref 14.0–48.0)
MCH: 34.7 pg — ABNORMAL HIGH (ref 26.0–34.0)
MCHC: 33.8 g/dL (ref 32.0–36.0)
MCV: 103 fL — ABNORMAL HIGH (ref 81–101)
MONO#: 0.9 10*3/uL (ref 0.1–0.9)
MONO%: 9.3 % (ref 0.0–13.0)
NEUT#: 6 10*3/uL (ref 1.5–6.5)
NEUT%: 60.1 % (ref 39.6–80.0)
Platelets: 425 10*3/uL — ABNORMAL HIGH (ref 145–400)
RBC: 3.23 10*6/uL — ABNORMAL LOW (ref 3.70–5.32)
RDW: 13.6 % (ref 11.1–15.7)
WBC: 10.1 10*3/uL — ABNORMAL HIGH (ref 3.9–10.0)

## 2017-09-08 LAB — FERRITIN: Ferritin: 227 ng/ml (ref 9–269)

## 2017-09-08 MED ORDER — HYDROCODONE-ACETAMINOPHEN 5-325 MG PO TABS: 2.0000 | ORAL_TABLET | ORAL | 0 refills | Status: DC | PRN

## 2017-09-08 NOTE — Telephone Encounter (Signed)
ud s in April  datatbase [id number] refilled it

## 2017-09-08 NOTE — Telephone Encounter (Signed)
Critical Value Creatinine 3.1 Dr Ennever notified. No orders at this time.  

## 2017-09-08 NOTE — Telephone Encounter (Signed)
Patient notified

## 2017-09-08 NOTE — Telephone Encounter (Signed)
Indication for chronic opioid: chronic pain Medication and dose: hydrocodone  # pills per month: 90 Last UDS date: 06/21/2017 Pain contract signed (Y/N): 06/21/2017 Date narcotic database last reviewed (include red flags): 09/08/2017

## 2017-09-08 NOTE — Telephone Encounter (Signed)
Patient requesting refill.for hydrocodone  Database ran and is on your desk for review.  Last filled per database: 04/30/17 Last written: 03/23/17 Last ov: 06/21/17 Next ov: none Contract: 06/21/17 UDS: it was done but not reviewed by you on 06/21/17.  Please review.

## 2017-09-08 NOTE — Telephone Encounter (Signed)
Do you want to refill? Also when is she next due for UDS?

## 2017-09-08 NOTE — Progress Notes (Signed)
Hematology and Oncology Follow Up Visit  [name] [id number] 19-Jul-1956 62 y.o. 09/08/2017   Principle Diagnosis:  Recurrent iron deficiency anemia secondary to ulcerative colitis Right cephalic/basilic vein thrombus - transiently positive lupus        anticoagulant  Current Therapy:   IV iron as indicated - dose to be given on 03/23/2017    Interim History:  [name] is here today for follow-up.  She still does not feel all that great.  She got through the holidays.  She has had no obvious bleeding.  She has had no flareups of the ulcerative colitis.  When we last saw her in September, she was negative for the lupus anticoagulant.  Her iron studies that were done today show a ferritin of 227 with an iron saturation of 23%.  She just has a lot of aches.  She has some arthralgias and myalgias.  Currently, her performance status is ECOG 1-2.  Medications:  Allergies as of 09/08/2017      Reactions   Morphine Other (See Comments)   Gives me crazy dreams   Sulfonamide Derivatives Itching, Swelling, Rash      Medication List        Accurate as of 09/08/17 10:36 AM. Always use your most recent med list.          BIOTIN PO Take 1 tablet by mouth daily.   calcitRIOL 0.25 MCG capsule Commonly known as:  ROCALTROL Take 0.25 mcg by mouth 3 (three) times a week. Mon, Wed, Fri   DULoxetine 60 MG capsule Commonly known as:  CYMBALTA TAKE 2 CAPSULES BY MOUTH  DAILY   folic acid 1 MG tablet Commonly known as:  FOLVITE Take 2 tablets (2 mg total) by mouth daily.   GAS-X PO Take 1 tablet by mouth 2 (two) times daily.   HYDROcodone-acetaminophen 5-325 MG tablet Commonly known as:  NORCO/VICODIN Take 2 tablets by mouth every 4 (four) hours as needed for moderate pain or severe pain.   Melatonin 5 MG Tabs Take 5 mg at bedtime by mouth.   metoprolol succinate 100 MG 24 hr tablet Commonly known as:  TOPROL-XL Take 1 tablet (100 mg total) daily by mouth. Take with or  immediately following a meal.   potassium chloride 20 MEQ/15ML (10%) Soln Take 2 mEq by mouth daily. 1.5 ml daily = 2 mEq daily   ranitidine 150 MG tablet Commonly known as:  ZANTAC Take 1 tablet (150 mg total) by mouth 2 (two) times daily.   torsemide 20 MG tablet Commonly known as:  DEMADEX Take 20 mg by mouth daily.   ULORIC 40 MG tablet Generic drug:  febuxostat TAKE 1 TABLET BY MOUTH  DAILY   vitamin B-12 100 MCG tablet Commonly known as:  CYANOCOBALAMIN Take 100 mcg by mouth daily.   Vitamin D-3 5000 units Tabs Take 1 tablet by mouth daily.       Allergies:  Allergies  Allergen Reactions  . Morphine Other (See Comments)    Gives me crazy dreams  . Sulfonamide Derivatives Itching, Swelling and Rash    Past Medical History, Surgical history, Social history, and Family History were reviewed and updated.  Review of Systems:  as stated in the interim history  Physical Exam:  weight is 182 lb 8 oz (82.8 kg). Her oral temperature is 97.7 F (36.5 C). Her blood pressure is 80/62 (abnormal) and her pulse is 101 (abnormal). Her respiration is 20 and oxygen saturation is 100%.  Wt Readings from Last 3 Encounters:  09/08/17 182 lb 8 oz (82.8 kg)  09/02/17 185 lb (83.9 kg)  07/21/17 183 lb 3.2 oz (83.1 kg)    Physical Exam  Constitutional: She is oriented to person, place, and time.  HENT:  Head: Normocephalic and atraumatic.  Mouth/Throat: Oropharynx is clear and moist.  Eyes: EOM are normal. Pupils are equal, round, and reactive to light.  Neck: Normal range of motion.  Cardiovascular: Normal rate, regular rhythm and normal heart sounds.  Pulmonary/Chest: Effort normal and breath sounds normal.  Abdominal: Soft. Bowel sounds are normal.  Musculoskeletal: Normal range of motion. She exhibits no edema, tenderness or deformity.  Lymphadenopathy:    She has no cervical adenopathy.  Neurological: She is alert and oriented to person, place, and time.  Skin: Skin  is warm and dry. No rash noted. No erythema.  Psychiatric: She has a normal mood and affect. Her behavior is normal. Judgment and thought content normal.  Vitals reviewed.    Lab Results  Component Value Date   WBC 10.1 (H) 09/08/2017   HGB 11.2 (L) 09/08/2017   HCT 33.1 (L) 09/08/2017   MCV 103 (H) 09/08/2017   PLT 425 (H) 09/08/2017   Lab Results  Component Value Date   FERRITIN 391 (H) 07/07/2017   IRON 70 07/07/2017   TIBC 284 07/07/2017   UIBC 214 07/07/2017   IRONPCTSAT 25 07/07/2017   Lab Results  Component Value Date   RETICCTPCT 2.5 01/14/2017   RBC 3.23 (L) 09/08/2017   No results found for: KPAFRELGTCHN, LAMBDASER, KAPLAMBRATIO No results found for: IGGSERUM, IGA, IGMSERUM No results found for: Odetta Pink, SPEI   Chemistry      Component Value Date/Time   NA 142 09/08/2017 0923   K 3.7 09/08/2017 0923   CL 97 (L) 09/08/2017 0923   CO2 28 09/08/2017 0923   BUN 49 (H) 09/08/2017 0923   CREATININE 3.1 (HH) 09/08/2017 0923      Component Value Date/Time   CALCIUM 9.9 09/08/2017 0923   CALCIUM 8.9 04/12/2013 0851   ALKPHOS 89 (H) 09/08/2017 0923   AST 25 09/08/2017 0923   ALT 22 09/08/2017 0923   BILITOT 0.50 09/08/2017 0923      Impression and Plan: [name] is a very pleasant 62 yo caucasian female with ulcerative colitis and episodes of GI and rectal bleeding.   Her iron levels look pretty good.  Her hemoglobin certainly has not dropped.  It continues to go up.  Of note, her erythropoietin level is 20.  I am sure that we could justify using Aranesp if necessary.  She does have worsening renal function.  She has seen nephrology.  She actually had a kidney biopsy of her left kidney last week.  It will be interesting to see what is going on.  She may have nephritis.  I would like to make sure she comes back in 4 weeks so we can stay on top of this renal issue.  [name], MD 1/3/201910:36  AM

## 2017-09-08 NOTE — Telephone Encounter (Signed)
Copied from [place] [phone]. Topic: Quick Communication - Rx Refill/Question >> Sep 08, 2017  9:01 AM Carolyn Stare wrote: Has the patient contacted their pharmacy    NO    RX HYDROcodone-acetaminophen (NORCO/VICODIN) 5-325 MG tablet  PICK UP RX   PHARMACY [facility] [address]

## 2017-09-09 ENCOUNTER — Telehealth: Payer: Self-pay | Admitting: *Deleted

## 2017-09-09 LAB — ERYTHROPOIETIN: Erythropoietin: 19.9 m[IU]/mL — ABNORMAL HIGH (ref 2.6–18.5)

## 2017-09-09 NOTE — Telephone Encounter (Addendum)
Patient is aware of results  ----- Message from [name], MD sent at 09/08/2017  6:39 PM EST ----- Call - iron level is ok!!!  pete

## 2017-09-12 ENCOUNTER — Telehealth: Payer: Self-pay

## 2017-09-12 ENCOUNTER — Other Ambulatory Visit: Payer: Self-pay

## 2017-09-12 ENCOUNTER — Encounter (HOSPITAL_COMMUNITY): Payer: Self-pay

## 2017-09-12 ENCOUNTER — Telehealth: Payer: Self-pay | Admitting: Gastroenterology

## 2017-09-12 MED ORDER — VEDOLIZUMAB 300 MG IV SOLR: 300.0000 mg | INTRAVENOUS | 12 refills | Status: DC

## 2017-09-12 NOTE — Telephone Encounter (Signed)
Spoke to patient, she is supposed to be getting Entyvio infusion every 30 days. Somehow this medication was deleted off of her med list, re-added it today.

## 2017-09-12 NOTE — Telephone Encounter (Signed)
Error

## 2017-09-12 NOTE — Telephone Encounter (Signed)
PA initiated via Covermymeds; KEY: [id number]. Awaiting determination.

## 2017-09-12 NOTE — Telephone Encounter (Signed)
Request Reference Number: [id number]. HYDROCO/APAP TAB 5-325MG  is approved through 10/13/2017. For further questions, call [phone].

## 2017-09-12 NOTE — Telephone Encounter (Signed)
PA approved effective 09/12/2017 through 10/13/2017.

## 2017-09-15 DIAGNOSIS — N183 Chronic kidney disease, stage 3 (moderate): Secondary | ICD-10-CM | POA: Diagnosis not present

## 2017-09-15 DIAGNOSIS — K5100 Ulcerative (chronic) pancolitis without complications: Secondary | ICD-10-CM | POA: Diagnosis not present

## 2017-09-15 DIAGNOSIS — N2581 Secondary hyperparathyroidism of renal origin: Secondary | ICD-10-CM | POA: Diagnosis not present

## 2017-09-21 ENCOUNTER — Encounter: Payer: Self-pay | Admitting: Gastroenterology

## 2017-09-21 ENCOUNTER — Ambulatory Visit: Payer: Medicare Other | Admitting: Gastroenterology

## 2017-09-21 VITALS — BP 108/72 | HR 74 | Ht 63.0 in | Wt 189.0 lb

## 2017-09-21 DIAGNOSIS — K91850 Pouchitis: Secondary | ICD-10-CM

## 2017-09-21 DIAGNOSIS — K50919 Crohn's disease, unspecified, with unspecified complications: Secondary | ICD-10-CM

## 2017-09-21 DIAGNOSIS — K602 Anal fissure, unspecified: Secondary | ICD-10-CM

## 2017-09-21 MED ORDER — AMBULATORY NON FORMULARY MEDICATION: 3 refills | Status: DC

## 2017-09-21 NOTE — Progress Notes (Signed)
HPI :  IBD history: 62 year old white female with IBD (? ulcerative colitis vs Crohn's) since 1990. 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 She reports she was never able to be put in remission prior to colectomy. She reports only being exposed to steroids prior to her surgery. Post-operatively, she was on Remicade for 3-4 years but she is not sure if it helped too much. She has been on 6MP remotely but did not work for her. She was on Humira but also did not put her into remission and she developed high antibody titers and it was stopped. Most recently on Entyvio and low dose 6MP. She reports multiple courses of antibiotics with cipro and flagyl with some benefit historically. She has been on budesonider with ? Relief. Father and daughter have UC.   SINCE THE LAST VISIT:  Patient is here for follow-up visit. Since the last visit her 6-MP has been held due to anemia, she has been on IV iron supplementation with Dr. Marin Olp. She had a pouchoscopy on 03/31/17 showing some improvement in the inflammation, had focal / mild inflammation. Her Entyvio was increased to once every 4 week dosing after discussion of her management with Dr. Emelda Fear of [place]. Her Entyvio level previously ws 4.8 without antibodies, goal trough level was > 12. She was given topical nitroglycerin to treat an anal fissure and also given a course of Tinidazole 524m BID and Ciprofloxacin 5041monce daily for one month.   Overall she states these measures have considerably provided her some benefit. She is having roughly 5 bowel movements today which her baseline. She states she only has rare blood in the stools at this point, her fissures have healed on nitroglycerin. She is receiving home infusions of Entyvio which she states is going well. She has occasional left lower quadrant pain which is quite mild and not too bothersome. She is gaining weight. Her hemoglobin has risen to 11.2. She is taking 5000  units of vitamin D per day.  Main thing that is going on with her health at this point is worsening renal function. Her creatinine has risen to the threes. She underwent a renal biopsy by nephrology, she states it did not show evidence of nephritis. She has been followed with labs closely by nephrology.  Her flu shot and pneumococcal vaccine are up-to-date.  Pouchoscopy 03/31/17 - anal fissure, mild anal canal stenosis, focal ulceration of pouch and more proximal ileum but improved compared to previous.     Past Medical History:  Diagnosis Date  . Abdominal pain    Hx  . Allergy   . Anal stenosis   . Anemia   . Anxiety   . Arthritis   . Asthma    patient does not have inhaler  . Blood in stool    Hx  . Blood in urine    Hx  . Blood transfusion without reported diagnosis   . De Quervain's tenosynovitis   . Depression   . Difficulty urinating    Hx  . Easy bruising   . Esophagitis   . Fainting    History - resolved - due to dehydration  . Fatigue    Hx  . Fibroid    Hx  . Gastric polyp   . GERD (gastroesophageal reflux disease)   . 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 Hemorrhoids, external   . Hemorrhoids, internal   .  Herpes, genital    vaginal treated 07/05/12 and pt states is resolved  . History of cervical dysplasia   . History of small bowel obstruction   . Hyperlipidemia    currently no meds  . Hyperparathyroidism   . Hypertension   . Hypokalemia    Hx  . 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 Obesity   . Ovarian cyst   . Poor dental hygiene   . Pulmonary nodule, right    right upper lobe  . Rectal bleeding    Hx  . Rectal pain    Hx  . Renal insufficiency    CKD - stage 2  . RLS (restless legs syndrome)    no meds  . Tooth infection 11/2016   right low  . Ulcerative colitis   . Visual disturbance    wears glasses  . Weakness  generalized    Hx - patient denies generalized weakness  . Wears dentures    upper only     Past Surgical History:  Procedure Laterality Date  . ANAL DILATION    . CERVICAL BIOPSY  W/ LOOP ELECTRODE EXCISION    . CHOLECYSTECTOMY    . COLONOSCOPY     [name]  . fatty tumor removed from back     X 2  . HEMORRHOID SURGERY    . ILEOSTOMY CLOSURE    . RESTORATIVE PROCTOCOLECTOMY     with insertion of ileoanal J Pouch with loop ileostomy  . SIGMOIDOSCOPY    . TOTAL ABDOMINAL HYSTERECTOMY  1998   TAH/LSO  . TUBAL LIGATION    . UPPER GASTROINTESTINAL ENDOSCOPY     [name]   Family History  Problem Relation Age of Onset  . Ulcerative colitis Father   . Hypertension Father   . Heart attack Father   . Hypertension Mother   . Heart disease Mother        s/p pci  . Ulcerative colitis Daughter   . Irritable bowel syndrome Unknown        grandchildren  . Diabetes Sister   . Cancer Sister        uterine  . Cancer Maternal Uncle        LUNG  . Colon cancer Neg Hx   . Esophageal cancer Neg Hx   . Stomach cancer Neg Hx   . Rectal cancer Neg Hx    Social History   Tobacco Use  . Smoking status: Former Smoker    Packs/day: 1.00    Years: 4.00    Pack years: 4.00    Types: Cigarettes    Last attempt to quit: 05/21/1979    Years since quitting: 38.3  . Smokeless tobacco: Never Used  Substance Use Topics  . Alcohol use: No    Alcohol/week: 0.0 oz  . Drug use: No   Current Outpatient Medications  Medication Sig Dispense Refill  . BIOTIN PO Take 1 tablet by mouth daily.     . Cholecalciferol (VITAMIN D-3) 5000 units TABS Take 1 tablet by mouth daily.    . DULoxetine (CYMBALTA) 60 MG capsule TAKE 2 CAPSULES BY MOUTH  DAILY 180 capsule 0  . Ergocalciferol (VITAMIN D2) 2000 units TABS Take by mouth daily.    . folic acid (FOLVITE) 1 MG tablet Take 2 tablets (2 mg total) by mouth daily. 180 tablet 3  . HYDROcodone-acetaminophen (NORCO/VICODIN) 5-325 MG tablet Take 2 tablets by  mouth every 4 (four) hours as needed for moderate pain or severe pain. 90 tablet 0  . Melatonin 5 MG TABS Take 5 mg at bedtime by mouth.    . metoprolol succinate (TOPROL-XL) 100 MG 24 hr tablet Take 1 tablet (100 mg total) daily by mouth. Take with or immediately following a meal. 90 tablet 3  . ranitidine (ZANTAC) 150 MG tablet Take 1 tablet (150 mg total) by mouth 2 (two) times daily. 180 tablet 0  . Simethicone (GAS-X PO) Take 1 tablet by mouth at bedtime.     . sodium chloride 0.9 % SOLN 250 mL with vedolizumab 300 MG SOLR 300 mg Inject 300 mg into the vein every 30 (thirty) days. 300 mg 12  . torsemide (DEMADEX) 20 MG tablet Take 20 mg by mouth daily.    Marland Kitchen ULORIC 40 MG tablet TAKE 1 TABLET BY MOUTH  DAILY 90 tablet 0  . vitamin B-12 (CYANOCOBALAMIN) 100 MCG tablet Take 100 mcg by mouth daily.      No current facility-administered medications for this visit.    Allergies  Allergen Reactions  . Morphine Other (See Comments)    Gives me crazy dreams  . Sulfonamide Derivatives Itching, Swelling and Rash     Review of Systems: All systems reviewed and negative except where noted in HPI.   Lab Results  Component Value Date   WBC 10.1 (H) 09/08/2017   HGB 11.2 (L) 09/08/2017   HCT 33.1 (L) 09/08/2017   MCV 103 (H) 09/08/2017   PLT 425 (H) 09/08/2017    Lab Results  Component Value Date   CREATININE 3.1 (HH) 09/08/2017   BUN 49 (H) 09/08/2017   NA 142 09/08/2017   K 3.7 09/08/2017   CL 97 (L) 09/08/2017   CO2 28 09/08/2017    Lab Results  Component Value Date   ALT 22 09/08/2017   AST 25 09/08/2017   ALKPHOS 89 (H) 09/08/2017   BILITOT 0.50 09/08/2017    Lab Results  Component Value Date   IRON 70 09/08/2017   TIBC 309 09/08/2017   FERRITIN 227 09/08/2017       Physical Exam: BP 108/72   Pulse 74   Ht 5' 3"  (1.6 m)   Wt 189 lb (85.7 kg)   BMI 33.48 kg/m  Constitutional: Pleasant,well-developed, female in no acute distress. HEENT: Normocephalic and  atraumatic. Conjunctivae are normal. No scleral icterus. Neck supple.  Cardiovascular: Normal rate, regular rhythm.  Pulmonary/chest: Effort normal and breath sounds normal. No wheezing, rales or rhonchi. Abdominal: Soft, nondistended, nontender. there are no masses palpable. No hepatomegaly. Extremities: no edema Lymphadenopathy: No cervical adenopathy noted. Neurological: Alert and oriented to person place and time. Skin: Skin is warm and dry. No rashes noted. Psychiatric: Normal mood and affect. Behavior is normal.   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 See prior notes for full history.   She is currently being managed with Entyvio monotherapy, after use of 6-MP was associated with worsening anemia (unclear if related). She has a stable macrocytic anemia, followed by Dr. Marin Olp, this has improved. Overall clearly improved with Entyvio compared to previous. Her Entyvio level was 4.8 without antibodies. Pouchoscopy showed some mild inflammation. Her dose was increased to every 4 week dosing, she reports considerable improvement with this. Otherwise her rectal bleeding has considerably decreased with treatment of anal fissures with nitroglycerin. Generally she is doing really well at present time.   We  will plan on continuing Entyvio infusions every 4 weeks. Of note she is receiving these infusions at home and is working really well for her, and cheaper. I will refill her nitroglycerin ointment to use as needed for anal fissure. Her labs are up-to-date, vaccines up-to-date, following closely with hematology and nephrology.  I will see her back in 6 months or sooner if issues. If she has relapse or worsening of symptoms in the future would check Entyvio levels, and consider another course of Tinidazole / Ciprofloxacin as she has responded well to antibiotics in the past.   [place] Cellar, MD [facility] Pager  [phone]

## 2017-09-21 NOTE — Patient Instructions (Addendum)
If you are age 64 or older, your body mass index should be between 23-30. Your Body mass index is 33.48 kg/m. If this is out of the aforementioned range listed, please consider follow up with your Primary Care Provider.  If you are age 46 or younger, your body mass index should be between 19-25. Your Body mass index is 33.48 kg/m. If this is out of the aformentioned range listed, please consider follow up with your Primary Care Provider.   Continue on your current regiment. Please follow up with [place] in 6 months.  We have sent a prescription for nitroglycerin 0.125% gel to [facility]. You should apply a pea size amount to your rectum three times daily x 6-8 weeks.  [facility]'s information is below: Address: [address], [place], [place] [address]  [phone]   We have sent the following medications to your pharmacy for you to pick up at your convenience: Nitroglycerin   Thank you for entrusting me with your care and for [facility], Dr. [place] Cellar

## 2017-10-06 ENCOUNTER — Inpatient Hospital Stay: Payer: Medicare Other

## 2017-10-06 ENCOUNTER — Other Ambulatory Visit: Payer: Self-pay

## 2017-10-06 ENCOUNTER — Inpatient Hospital Stay: Payer: Medicare Other | Attending: Hematology & Oncology | Admitting: Hematology & Oncology

## 2017-10-06 VITALS — BP 135/87 | HR 87 | Temp 98.2°F | Resp 20 | Wt 184.0 lb

## 2017-10-06 DIAGNOSIS — D500 Iron deficiency anemia secondary to blood loss (chronic): Secondary | ICD-10-CM

## 2017-10-06 DIAGNOSIS — I82619 Acute embolism and thrombosis of superficial veins of unspecified upper extremity: Secondary | ICD-10-CM | POA: Diagnosis not present

## 2017-10-06 DIAGNOSIS — K51911 Ulcerative colitis, unspecified with rectal bleeding: Secondary | ICD-10-CM

## 2017-10-06 DIAGNOSIS — D6862 Lupus anticoagulant syndrome: Secondary | ICD-10-CM | POA: Diagnosis not present

## 2017-10-06 DIAGNOSIS — D509 Iron deficiency anemia, unspecified: Secondary | ICD-10-CM | POA: Diagnosis not present

## 2017-10-06 LAB — CMP (CANCER CENTER ONLY)
ALT: 12 U/L (ref 0–55)
AST: 21 U/L (ref 5–34)
Albumin: 3.6 g/dL (ref 3.5–5.0)
Alkaline Phosphatase: 77 U/L (ref 40–150)
Anion gap: 11 (ref 3–11)
BUN: 34 mg/dL — ABNORMAL HIGH (ref 7–26)
CO2: 24 mmol/L (ref 22–29)
Calcium: 9.7 mg/dL (ref 8.4–10.4)
Chloride: 107 mmol/L (ref 98–109)
Creatinine: 2.14 mg/dL — ABNORMAL HIGH (ref 0.60–1.10)
GFR, Est AFR Am: 27 mL/min — ABNORMAL LOW (ref >60)
GFR, Estimated: 23 mL/min — ABNORMAL LOW (ref >60)
Glucose, Bld: 93 mg/dL (ref 70–140)
Potassium: 4.1 mmol/L (ref 3.5–5.1)
Sodium: 142 mmol/L (ref 136–145)
Total Bilirubin: 0.3 mg/dL (ref 0.2–1.2)
Total Protein: 7.8 g/dL (ref 6.4–8.3)

## 2017-10-06 LAB — CBC WITH DIFFERENTIAL (CANCER CENTER ONLY)
Basophils Absolute: 0 10*3/uL (ref 0.0–0.1)
Basophils Relative: 0 %
Eosinophils Absolute: 0.3 10*3/uL (ref 0.0–0.5)
Eosinophils Relative: 3 %
HCT: 32.1 % — ABNORMAL LOW (ref 34.8–46.6)
Hemoglobin: 10.6 g/dL — ABNORMAL LOW (ref 11.6–15.9)
Lymphocytes Relative: 29 %
Lymphs Abs: 2.4 10*3/uL (ref 0.9–3.3)
MCH: 34.8 pg — ABNORMAL HIGH (ref 26.0–34.0)
MCHC: 33 g/dL (ref 32.0–36.0)
MCV: 105.2 fL — ABNORMAL HIGH (ref 81.0–101.0)
Monocytes Absolute: 0.8 10*3/uL (ref 0.1–0.9)
Monocytes Relative: 10 %
Neutro Abs: 4.7 10*3/uL (ref 1.5–6.5)
Neutrophils Relative %: 58 %
Platelet Count: 325 10*3/uL (ref 145–400)
RBC: 3.05 MIL/uL — ABNORMAL LOW (ref 3.70–5.32)
RDW: 13.7 % (ref 11.1–15.7)
WBC Count: 8.2 10*3/uL (ref 3.9–10.0)

## 2017-10-06 LAB — FERRITIN: Ferritin: 144 ng/mL (ref 9–269)

## 2017-10-06 LAB — IRON AND TIBC
Iron: 70 ug/dL (ref 41–142)
Saturation Ratios: 22 % (ref 21–57)
TIBC: 319 ug/dL (ref 236–444)
UIBC: 249 ug/dL

## 2017-10-06 LAB — RETICULOCYTES
RBC.: 3.11 MIL/uL — ABNORMAL LOW (ref 3.70–5.45)
Retic Count, Absolute: 80.9 10*3/uL (ref 33.7–90.7)
Retic Ct Pct: 2.6 % — ABNORMAL HIGH (ref 0.7–2.1)

## 2017-10-06 NOTE — Progress Notes (Signed)
Hematology and Oncology Follow Up Visit  [name] [id number] 15-Nov-1955 62 y.o. 10/06/2017   Principle Diagnosis:  Recurrent iron deficiency anemia secondary to ulcerative colitis Right cephalic/basilic vein thrombus - transiently positive lupus        anticoagulant  Current Therapy:   IV iron as indicated - dose to be given on 03/23/2017    Interim History:  [name] is here today for follow-up.  She made it through the holidays without any problems. She is on Entyvio for Jeanette ulcerative colitis.  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 He is drinking quite a bit. He had a no [name] mild cerebrovascular incident a week ago. He is now on aspirin. It is obvious that aspirin along with alcohol is going to be a very bad combination for him.  When we saw Jeanette back in early January, Jeanette ferritin was 227 with an iron saturation of 23%.  I did check Jeanette erythropoietin level when she was here last. It is only 20. As such, I suspect that she would respond to Aranesp if necessary.  She's had no bleeding.  She's had no fever. There's been no rashes. She's had no leg swelling.  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 She showed me pictures. I must say that they are very impressive looking.  Overall, I would say that Jeanette performance status is ECOG 1.  Medications:  Allergies as of 10/06/2017      Reactions   Morphine [name] (See Comments)   Gives me crazy dreams   Sulfonamide Derivatives Itching, Swelling, Rash      Medication List        Accurate as of 10/06/17  8:26 AM. Always use your most recent med list.          AMBULATORY NON FORMULARY MEDICATION Medication Name: Nitroglycerine ointment 0.125 %  Apply a pea sized amount internally four times daily. Dispense 30 GM zero refill   BIOTIN PO Take 1 tablet by mouth daily.   DULoxetine 60 MG capsule Commonly known as:  CYMBALTA TAKE 2 CAPSULES BY MOUTH  DAILY   folic  acid 1 MG tablet Commonly known as:  FOLVITE Take 2 tablets (2 mg total) by mouth daily.   GAS-X PO Take 1 tablet by mouth at bedtime.   HYDROcodone-acetaminophen 5-325 MG tablet Commonly known as:  NORCO/VICODIN Take 2 tablets by mouth every 4 (four) hours as needed for moderate pain or severe pain.   Melatonin 5 MG Tabs Take 5 mg at bedtime by mouth.   metoprolol succinate 100 MG 24 hr tablet Commonly known as:  TOPROL-XL Take 1 tablet (100 mg total) daily by mouth. Take with or immediately following a meal.   ranitidine 150 MG tablet Commonly known as:  ZANTAC Take 1 tablet (150 mg total) by mouth 2 (two) times daily.   sodium chloride 0.9 % SOLN 250 mL with vedolizumab 300 MG SOLR 300 mg Inject 300 mg into the vein every 30 (thirty) days.   torsemide 20 MG tablet Commonly known as:  DEMADEX Take 20 mg by mouth daily.   ULORIC 40 MG tablet Generic drug:  febuxostat TAKE 1 TABLET BY MOUTH  DAILY   vitamin B-12 100 MCG tablet Commonly known as:  CYANOCOBALAMIN Take 100 mcg by mouth daily.   Vitamin D-3 5000 units Tabs Take 1 tablet by mouth daily.   Vitamin D2 2000 units Tabs Take by mouth daily.  Allergies:  Allergies  Allergen Reactions  . Morphine [name] (See Comments)    Gives me crazy dreams  . Sulfonamide Derivatives Itching, Swelling and Rash    Past Medical History, Surgical history, Social history, and Family History were reviewed and updated.  Review of Systems: Review of Systems  Constitutional: Positive for malaise/fatigue.  HENT: Negative.   Eyes: Negative.   Respiratory: Negative.   Cardiovascular: Negative.   Gastrointestinal: Positive for diarrhea.  Genitourinary: Negative.   Musculoskeletal: Positive for back pain and myalgias.  Skin: Negative.   Neurological: Negative.   Endo/Heme/Allergies: Negative.   Psychiatric/Behavioral: Negative.     Physical Exam:  weight is 184 lb (83.5 kg). Jeanette oral temperature is 98.2 F (36.8  C). Jeanette blood pressure is 135/87 and Jeanette pulse is 87. Jeanette respiration is 20 and oxygen saturation is 100%.   Wt Readings from Last 3 Encounters:  10/06/17 184 lb (83.5 kg)  09/21/17 189 lb (85.7 kg)  09/08/17 182 lb 8 oz (82.8 kg)    Physical Exam  Constitutional: She is oriented to person, place, and time.  HENT:  Head: Normocephalic and atraumatic.  Mouth/Throat: Oropharynx is clear and moist.  Eyes: EOM are normal. Pupils are equal, round, and reactive to light.  Neck: Normal range of motion.  Cardiovascular: Normal rate, regular rhythm and normal heart sounds.  Pulmonary/Chest: Effort normal and breath sounds normal.  Abdominal: Soft. Bowel sounds are normal.  Musculoskeletal: Normal range of motion. She exhibits no edema, tenderness or deformity.  Lymphadenopathy:    She has no cervical adenopathy.  Neurological: She is alert and oriented to person, place, and time.  Skin: Skin is warm and dry. No rash noted. No erythema.  Psychiatric: She has a normal mood and affect. Jeanette behavior is normal. Judgment and thought content normal.  Vitals reviewed.    Lab Results  Component Value Date   WBC 8.2 10/06/2017   HGB 11.2 (L) 09/08/2017   HCT 32.1 (L) 10/06/2017   MCV 105.2 (H) 10/06/2017   PLT 325 10/06/2017   Lab Results  Component Value Date   FERRITIN 227 09/08/2017   IRON 70 09/08/2017   TIBC 309 09/08/2017   UIBC 239 09/08/2017   IRONPCTSAT 23 09/08/2017   Lab Results  Component Value Date   RETICCTPCT 2.5 01/14/2017   RBC 3.05 (L) 10/06/2017   No results found for: KPAFRELGTCHN, LAMBDASER, KAPLAMBRATIO No results found for: IGGSERUM, IGA, IGMSERUM No results found for: Odetta Pink, SPEI   Chemistry      Component Value Date/Time   NA 142 09/08/2017 0923   K 3.7 09/08/2017 0923   CL 97 (L) 09/08/2017 0923   CO2 28 09/08/2017 0923   BUN 49 (H) 09/08/2017 0923   CREATININE 3.1 (HH) 09/08/2017 0923       Component Value Date/Time   CALCIUM 9.9 09/08/2017 0923   CALCIUM 8.9 04/12/2013 0851   ALKPHOS 89 (H) 09/08/2017 0923   AST 25 09/08/2017 0923   ALT 22 09/08/2017 0923   BILITOT 0.50 09/08/2017 0923      Impression and Plan: [name] is a very pleasant 62 yo caucasian female with ulcerative colitis and episodes of GI and rectal bleeding.   We will have to see what Jeanette iron levels are. It would not surprise me if we had to give Jeanette some iron.  We could always consider Aranesp. However, I would prefer to just go with IV iron as she has  responded well to this.  We will plan to get Jeanette back in another 6 weeks.  We have to ensure that we continue to closely monitor Jeanette status.  We will definitely pray for [name] to stop drinking.   [name], MD 1/31/20198:26 AM

## 2017-10-13 DIAGNOSIS — K5100 Ulcerative (chronic) pancolitis without complications: Secondary | ICD-10-CM | POA: Diagnosis not present

## 2017-10-19 ENCOUNTER — Ambulatory Visit: Payer: Medicare Other | Admitting: Cardiovascular Disease

## 2017-10-20 ENCOUNTER — Other Ambulatory Visit: Payer: Self-pay | Admitting: Family Medicine

## 2017-10-20 DIAGNOSIS — F32A Depression, unspecified: Secondary | ICD-10-CM

## 2017-10-20 DIAGNOSIS — F329 Major depressive disorder, single episode, unspecified: Secondary | ICD-10-CM

## 2017-10-22 IMAGING — US US EXTREM  UP VENOUS*R*
1 series · 13 of 24 positions shown · non-contrast
Comparison: None.

CLINICAL DATA: Right upper extremity pain and edema for 1 week.



[Series 1: us extrem up venous*right* · 0.07mm/px · 13 of 43 slices shown]
[im 1/43]
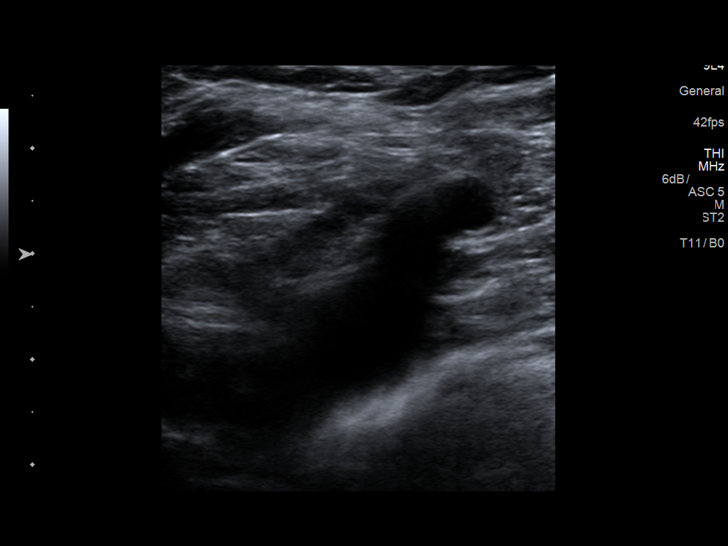
[im 4/43]
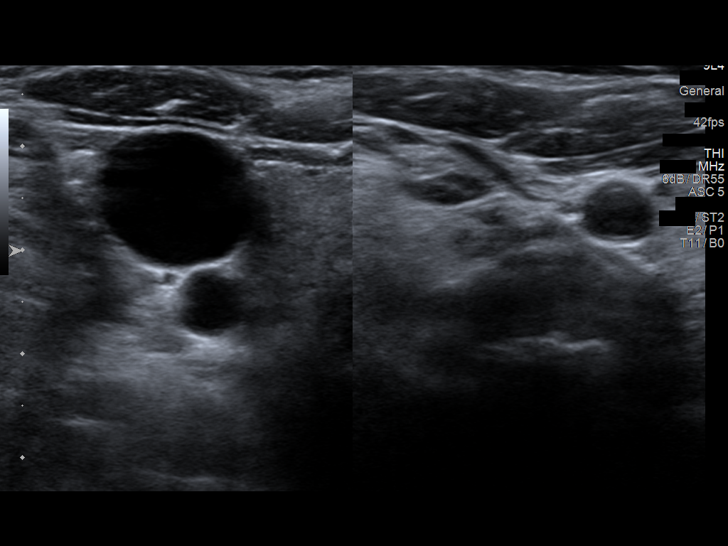
[im 8/43]
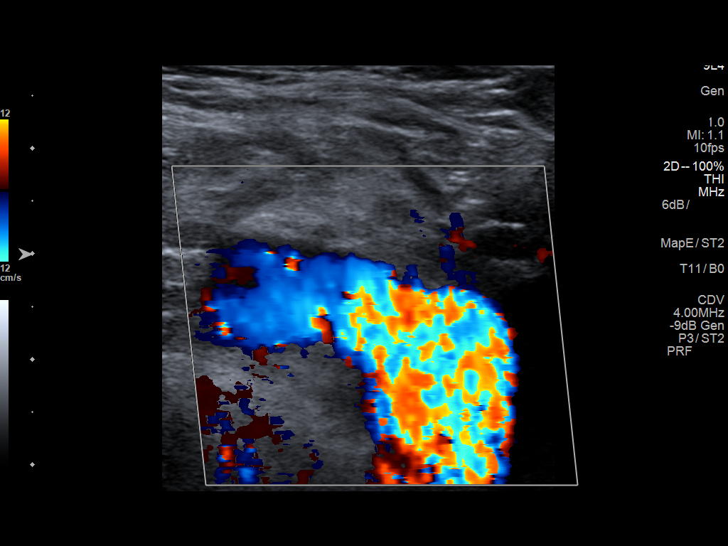
[im 11/43]
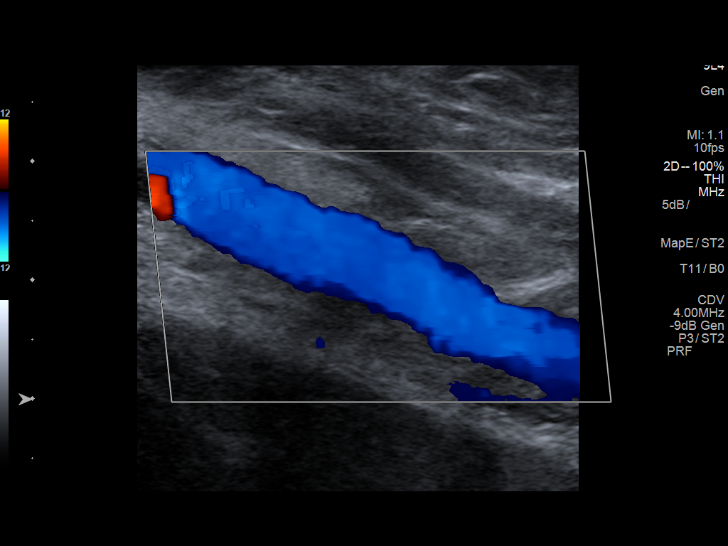
[im 15/43]
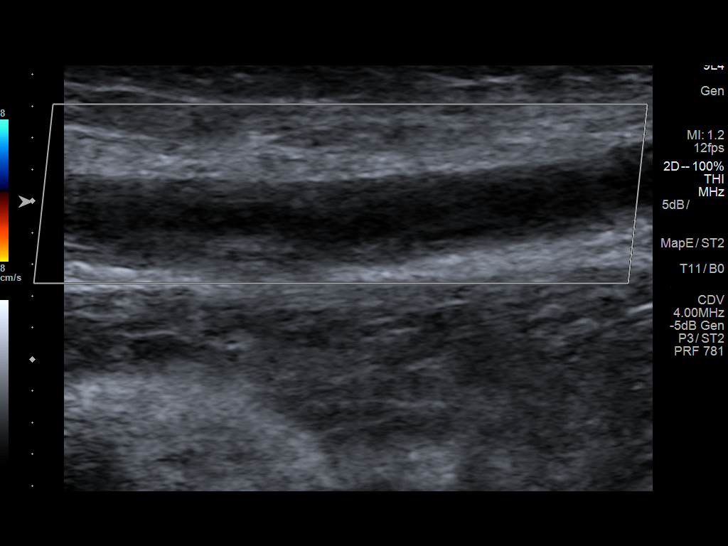
[im 19/43]
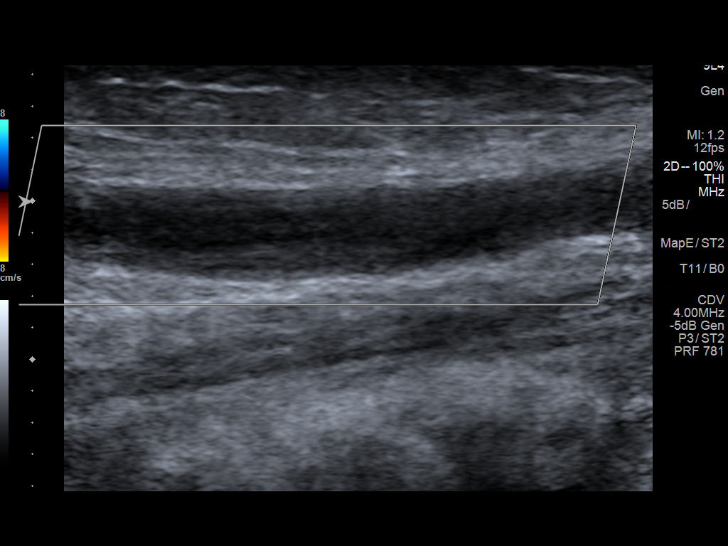
[im 22/43]
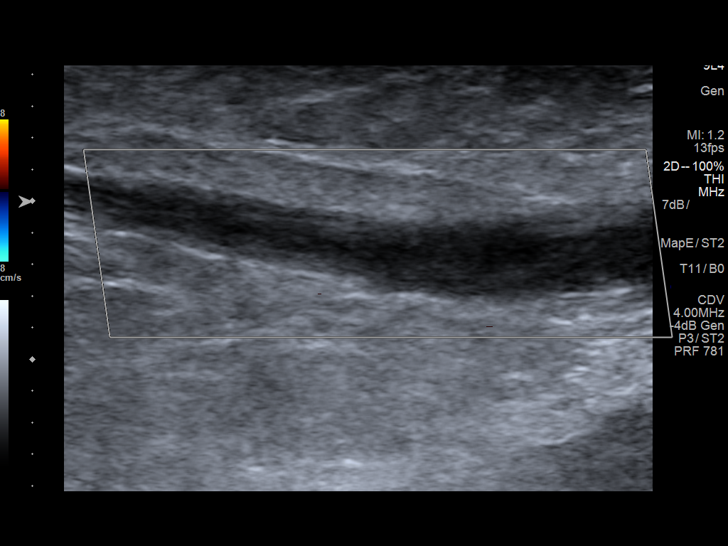
[im 24/43]
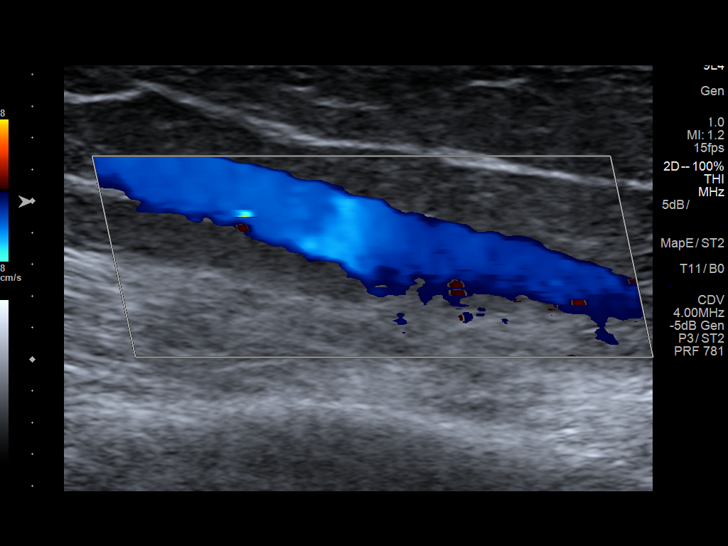
[im 28/43]
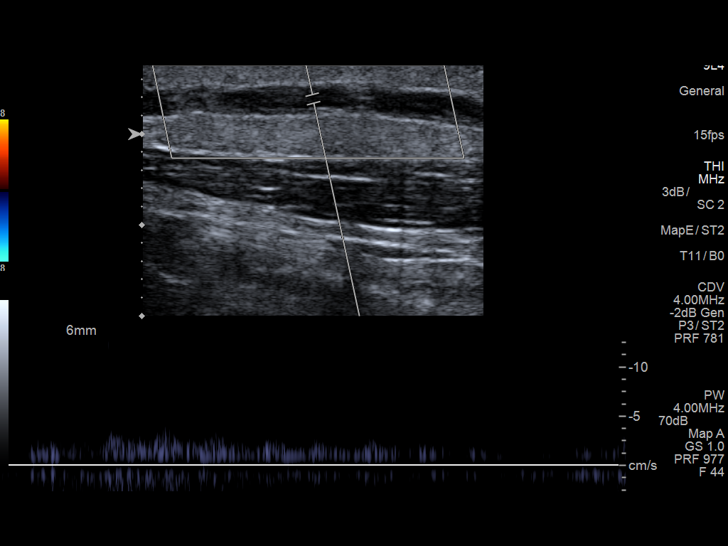
[im 32/43]
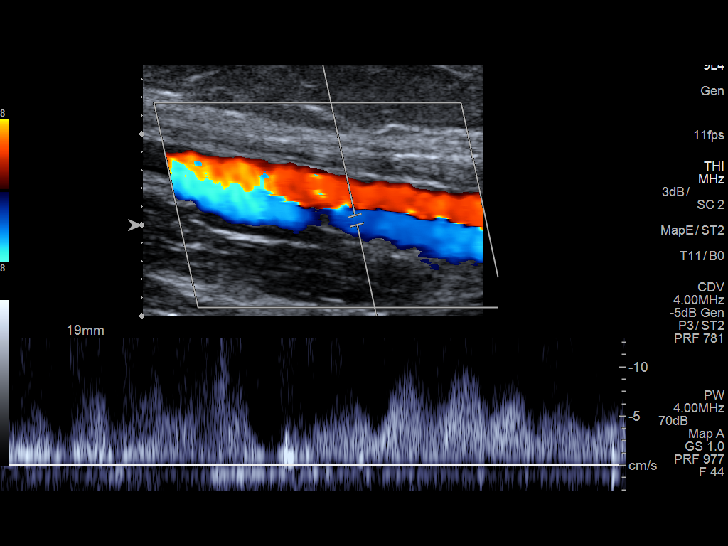
[im 35/43]
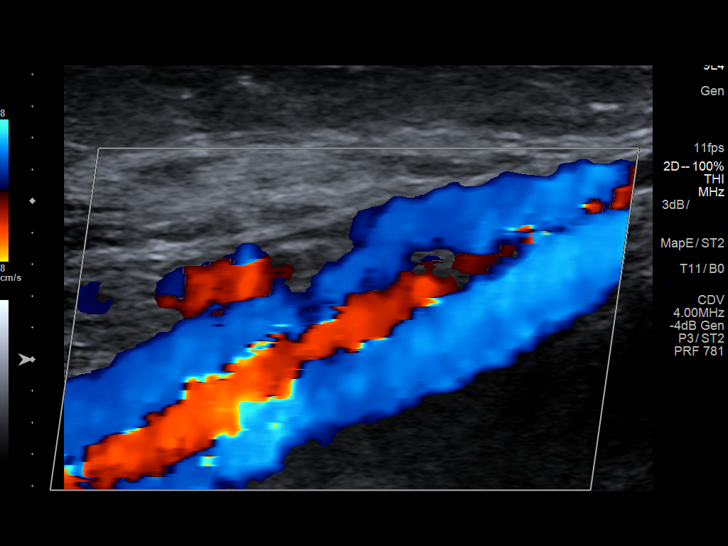
[im 39/43]
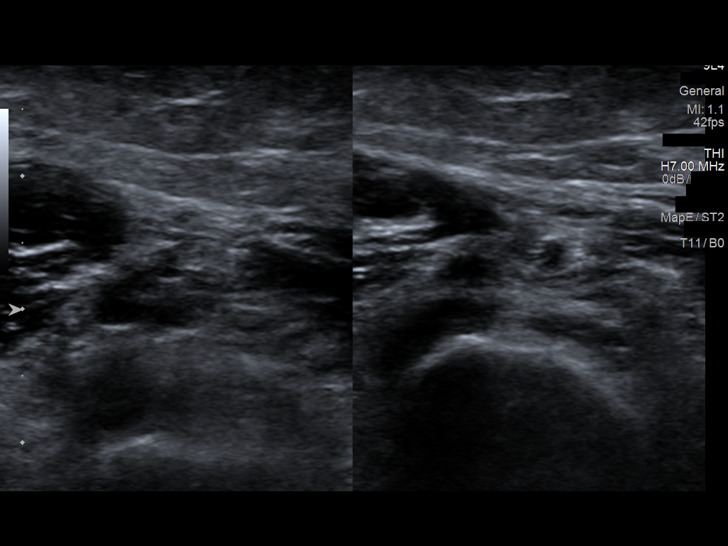
[im 43/43]
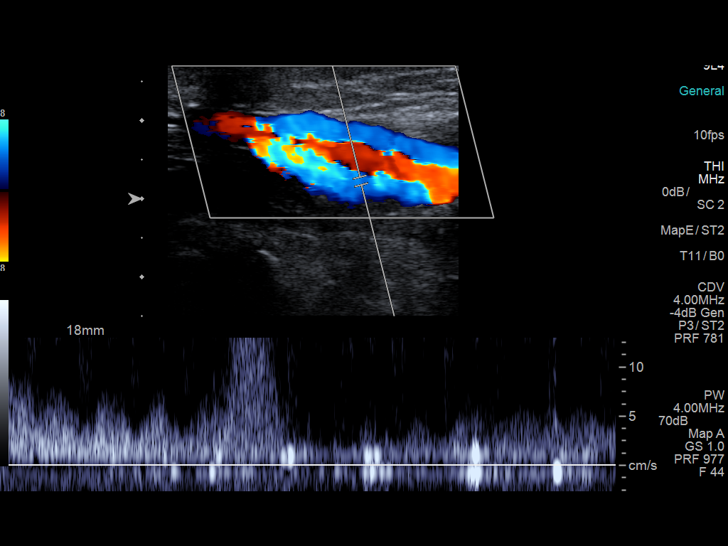

[13 of 24 positions shown; findings below may reference images not displayed]

FINDINGS: Contralateral Subclavian Vein: Respiratory phasicity is normal and
symmetric with the symptomatic side. No evidence of thrombus. Normal
compressibility.

Internal Jugular Vein: No evidence of thrombus. Normal
compressibility, respiratory phasicity and response to augmentation.

Subclavian Vein: No evidence of thrombus. Normal compressibility,
respiratory phasicity and response to augmentation.

Axillary Vein: No evidence of thrombus. Normal compressibility,
respiratory phasicity and response to augmentation.

Cephalic Vein: Acute thrombus is noted in the cephalic vein in the
mid forearm.

Basilic Vein: Nonocclusive thrombus is noted in the basilic vein
extending from the elbow joint to the mid biceps region.

Brachial Veins: No evidence of thrombus. Normal compressibility,
respiratory phasicity and response to augmentation.

Radial Veins: No evidence of thrombus. Normal compressibility,
respiratory phasicity and response to augmentation.

Ulnar Veins: No evidence of thrombus. Normal compressibility,
respiratory phasicity and response to augmentation.

Venous Reflux:  None visualized.

Other Findings:  None visualized.
IMPRESSION: Acute thrombosis of the right cephalic and basilic veins is noted.
These results were called to the ordering clinician or
representative by the [HOSPITAL] at the imaging location.

## 2017-10-27 IMAGING — US US EXTREM  UP VENOUS*R*
1 series · 13 of 24 positions shown · non-contrast
Comparison: [DATE]

CLINICAL DATA: Recent superficial thrombophlebitis in the RIGHT
cephalic and basilic veins 5 days ago question extension in the deep
venous system due to continued pain and swelling



[Series 1: us extrem up venous*right* · 0.07mm/px · 52 acquisitions, 13 frames shown]
[im 1/52]
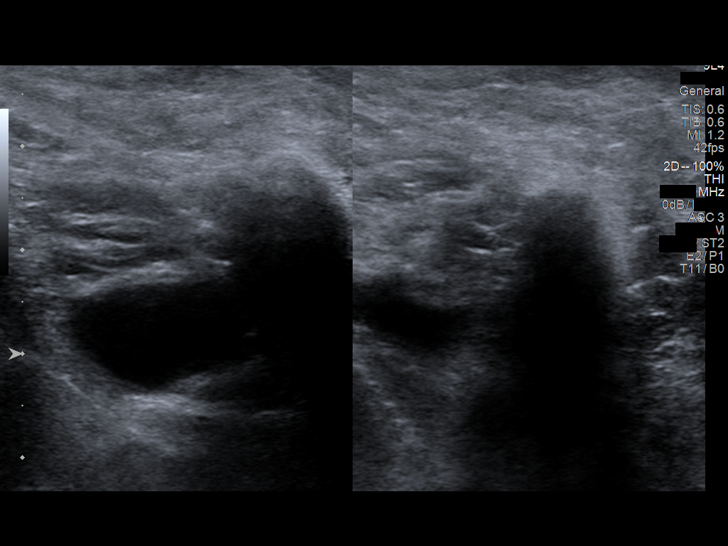
[im 5/52]
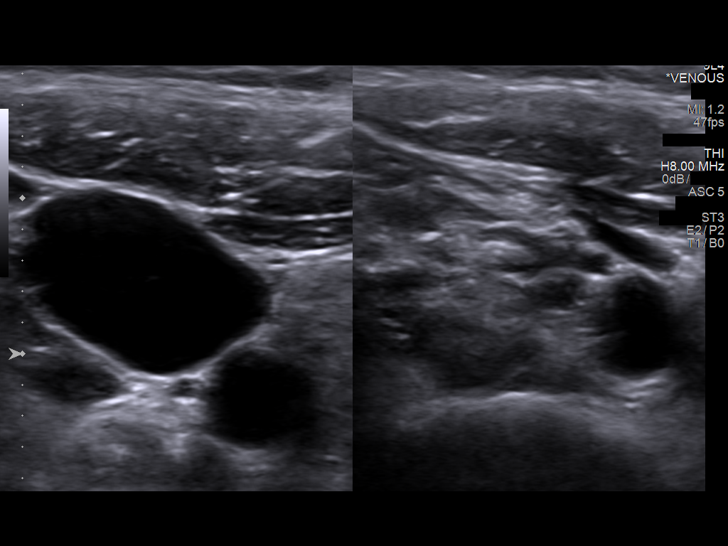
[im 9/52]
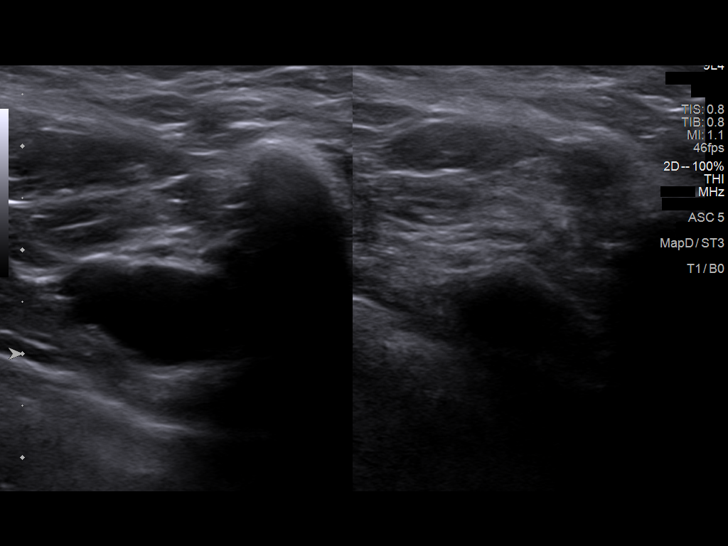
[im 16/52]
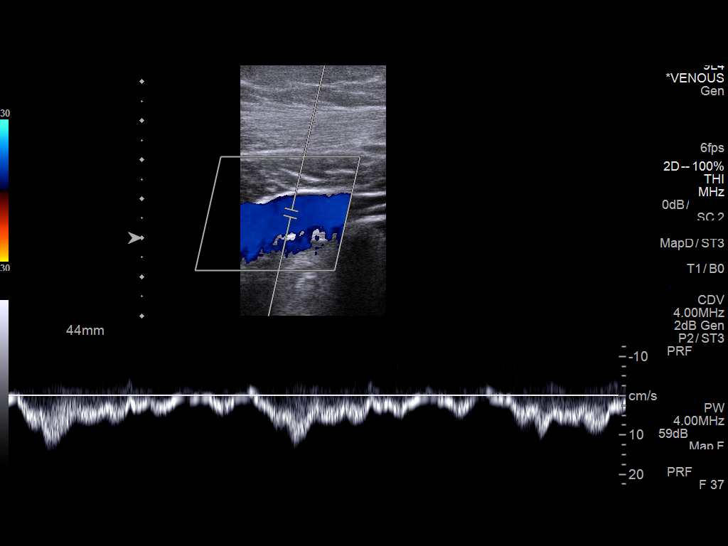
[im 20/52]
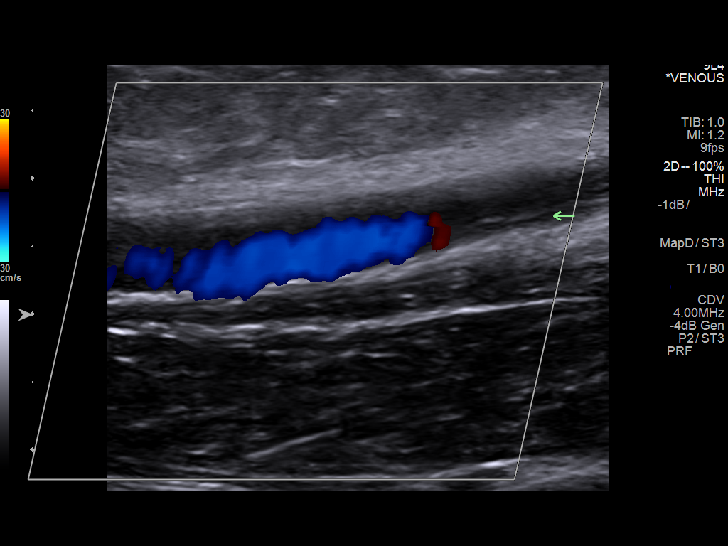
[im 25/52]
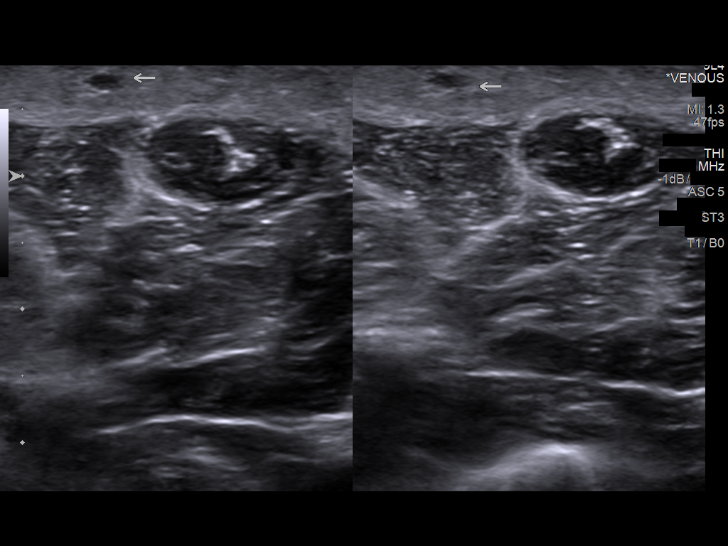
[im 29/52]
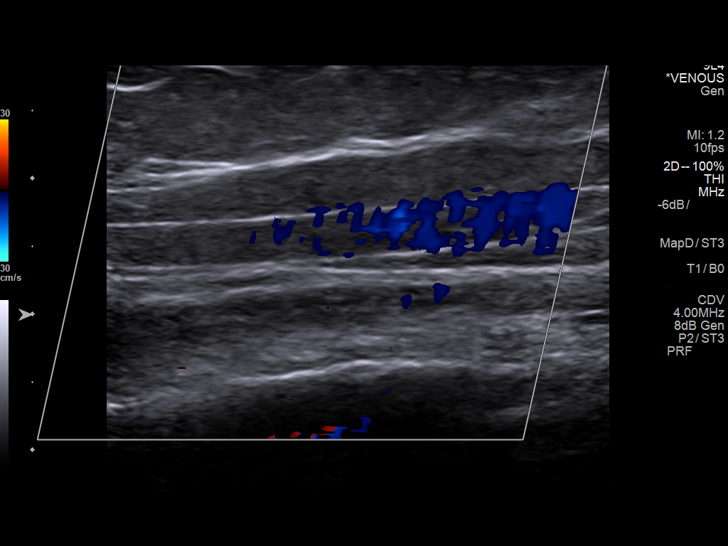
[im 32/52]
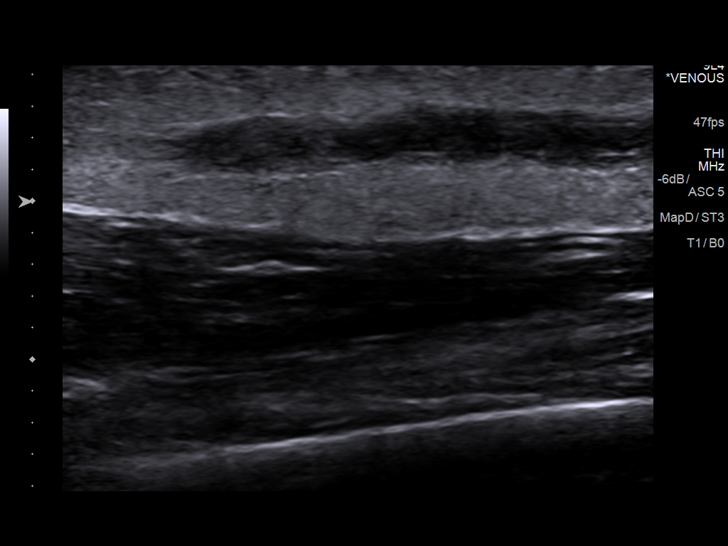
[im 36/52]
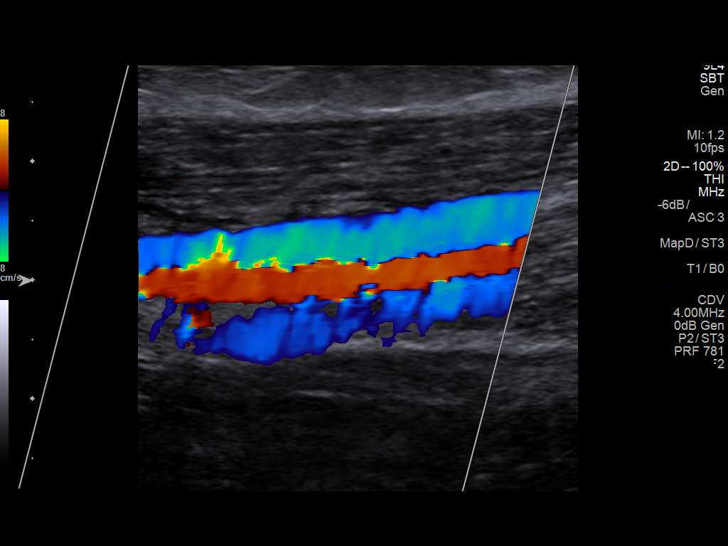
[im 40/52]
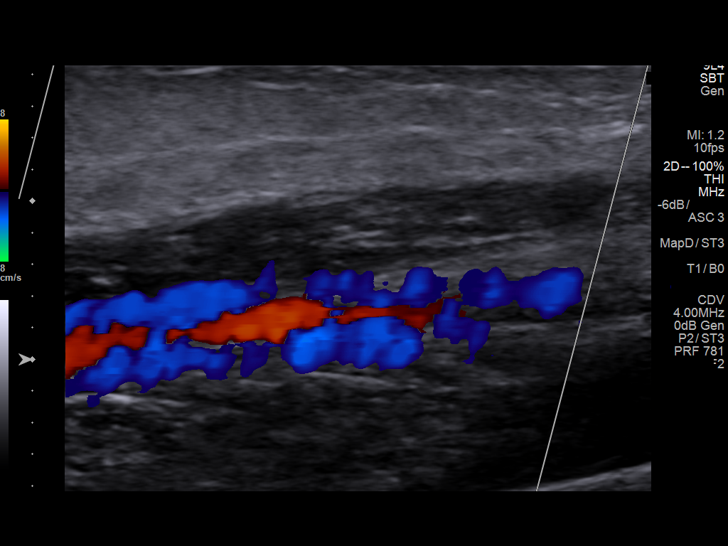
[im 47/52]
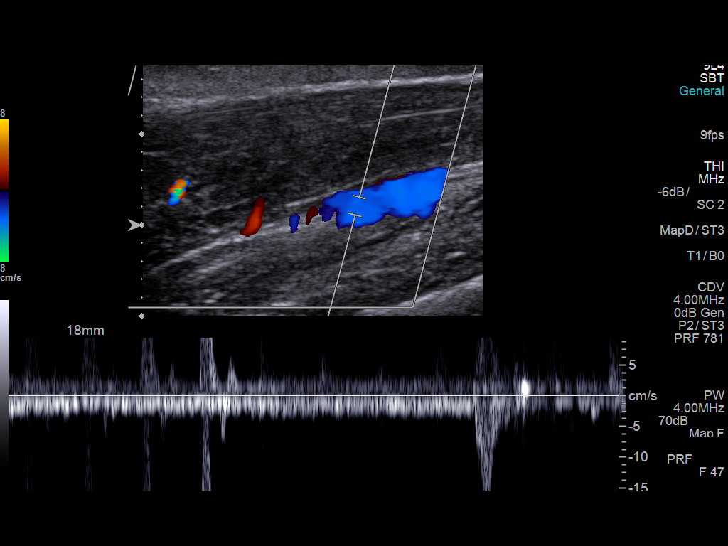
[im 49/52]
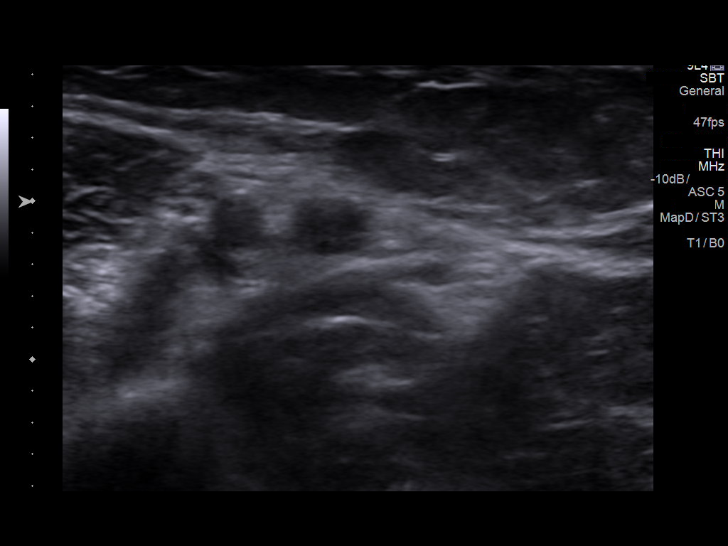
[im 52/52]
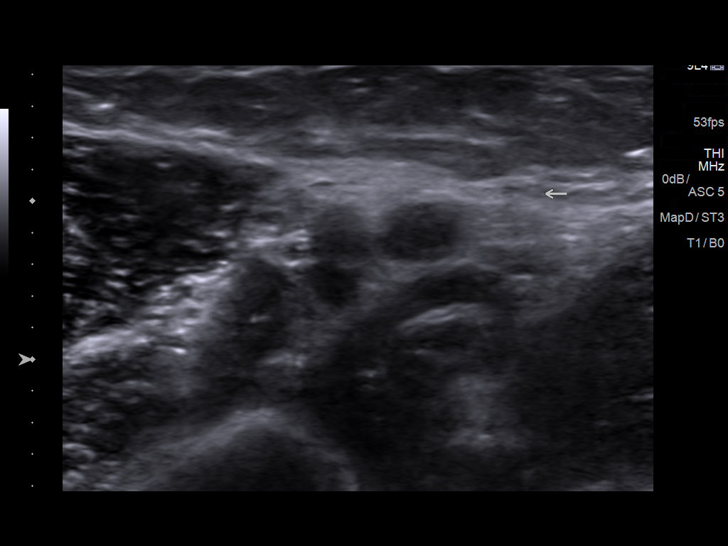

[13 of 24 positions shown; findings below may reference images not displayed]

FINDINGS: Contralateral Subclavian Vein: No evidence of thrombus. Normal
spontaneous venous flow with respiratory phasicity and response to
augmentation.

Internal Jugular Vein: No evidence of thrombus. Normal
compressibility, respiratory phasicity and response to augmentation.

Subclavian Vein: No evidence of thrombus. Normal compressibility,
respiratory phasicity and response to augmentation.

Axillary Vein: No evidence of thrombus. Normal compressibility,
respiratory phasicity and response to augmentation.

Cephalic Vein: Hypoechoic occlusive thrombus within RIGHT cephalic
vein. Impaired compressibility and absent spontaneous venous flow.

Basilic Vein: Hypoechoic occlusive thrombus within RIGHT basilic
vein extending to start of the axillary vein. Impaired
compressibility and absent spontaneous venous flow.

Brachial Veins: No evidence of thrombus. Normal compressibility,
respiratory phasicity and response to augmentation.

Radial Veins: No evidence of thrombus. Normal compressibility,
respiratory phasicity and response to augmentation.

Ulnar Veins: No evidence of thrombus. Normal compressibility,
respiratory phasicity and response to augmentation.

Venous Reflux:  None visualized.

Other Findings:  None visualized.
IMPRESSION: Positive exam for presence of thrombus within the RIGHT cephalic and
basilic veins.

Thrombus in the basilic vein extends to the origin of the axillary
vein, which remains patent.

## 2017-11-07 ENCOUNTER — Telehealth: Payer: Self-pay | Admitting: *Deleted

## 2017-11-07 ENCOUNTER — Telehealth: Payer: Self-pay | Admitting: Family Medicine

## 2017-11-07 DIAGNOSIS — D631 Anemia in chronic kidney disease: Secondary | ICD-10-CM | POA: Diagnosis not present

## 2017-11-07 DIAGNOSIS — N183 Chronic kidney disease, stage 3 (moderate): Secondary | ICD-10-CM | POA: Diagnosis not present

## 2017-11-07 DIAGNOSIS — R768 Other specified abnormal immunological findings in serum: Secondary | ICD-10-CM | POA: Diagnosis not present

## 2017-11-07 DIAGNOSIS — M109 Gout, unspecified: Secondary | ICD-10-CM

## 2017-11-07 DIAGNOSIS — N179 Acute kidney failure, unspecified: Secondary | ICD-10-CM | POA: Diagnosis not present

## 2017-11-07 DIAGNOSIS — I129 Hypertensive chronic kidney disease with stage 1 through stage 4 chronic kidney disease, or unspecified chronic kidney disease: Secondary | ICD-10-CM | POA: Diagnosis not present

## 2017-11-07 DIAGNOSIS — D509 Iron deficiency anemia, unspecified: Secondary | ICD-10-CM | POA: Diagnosis not present

## 2017-11-07 MED ORDER — FEBUXOSTAT 40 MG PO TABS: 40.0000 mg | ORAL_TABLET | Freq: Every day | ORAL | 0 refills | Status: DC

## 2017-11-07 NOTE — Telephone Encounter (Signed)
rx sent electronically. 

## 2017-11-07 NOTE — Telephone Encounter (Signed)
Copied from [place]. Topic: Inquiry >> Nov 07, 2017 10:14 AM Pricilla Handler wrote: Reason for CRM: Majorie of [facility] [phone]) called requesting an authorization to refill patient's ULORIC 40 MG tablet. Please call Majorie at [phone], Ref # [id number].         Thank You!!!

## 2017-11-07 NOTE — Telephone Encounter (Signed)
Copied from [place]. Topic: Inquiry >> Nov 07, 2017 10:14 AM Pricilla Handler wrote: Reason for CRM: Majorie of [facility] [phone]) called requesting an authorization to refill patient's ULORIC 40 MG tablet. Please call Majorie at [phone], Ref # [id number].         Thank You!!!

## 2017-11-10 ENCOUNTER — Inpatient Hospital Stay: Payer: Medicare Other

## 2017-11-10 ENCOUNTER — Other Ambulatory Visit: Payer: Self-pay

## 2017-11-10 ENCOUNTER — Inpatient Hospital Stay: Payer: Medicare Other | Attending: Hematology & Oncology | Admitting: Family

## 2017-11-10 VITALS — BP 147/93 | HR 94 | Temp 98.3°F | Resp 18 | Wt 187.0 lb

## 2017-11-10 DIAGNOSIS — K51911 Ulcerative colitis, unspecified with rectal bleeding: Secondary | ICD-10-CM | POA: Insufficient documentation

## 2017-11-10 DIAGNOSIS — R5383 Other fatigue: Secondary | ICD-10-CM | POA: Diagnosis not present

## 2017-11-10 DIAGNOSIS — I82619 Acute embolism and thrombosis of superficial veins of unspecified upper extremity: Secondary | ICD-10-CM

## 2017-11-10 DIAGNOSIS — D500 Iron deficiency anemia secondary to blood loss (chronic): Secondary | ICD-10-CM | POA: Diagnosis not present

## 2017-11-10 DIAGNOSIS — D6862 Lupus anticoagulant syndrome: Secondary | ICD-10-CM | POA: Diagnosis not present

## 2017-11-10 LAB — CBC WITH DIFFERENTIAL (CANCER CENTER ONLY)
Basophils Absolute: 0 10*3/uL (ref 0.0–0.1)
Basophils Relative: 0 %
Eosinophils Absolute: 0.3 10*3/uL (ref 0.0–0.5)
Eosinophils Relative: 4 %
HCT: 32.6 % — ABNORMAL LOW (ref 34.8–46.6)
Hemoglobin: 10.7 g/dL — ABNORMAL LOW (ref 11.6–15.9)
Lymphocytes Relative: 30 %
Lymphs Abs: 2.3 10*3/uL (ref 0.9–3.3)
MCH: 34.1 pg — ABNORMAL HIGH (ref 26.0–34.0)
MCHC: 32.8 g/dL (ref 32.0–36.0)
MCV: 103.8 fL — ABNORMAL HIGH (ref 81.0–101.0)
Monocytes Absolute: 0.7 10*3/uL (ref 0.1–0.9)
Monocytes Relative: 9 %
Neutro Abs: 4.3 10*3/uL (ref 1.5–6.5)
Neutrophils Relative %: 57 %
Platelet Count: 312 10*3/uL (ref 145–400)
RBC: 3.14 MIL/uL — ABNORMAL LOW (ref 3.70–5.32)
RDW: 13.9 % (ref 11.1–15.7)
WBC Count: 7.7 10*3/uL (ref 3.9–10.0)

## 2017-11-10 LAB — CMP (CANCER CENTER ONLY)
ALT: 14 U/L (ref 0–55)
AST: 21 U/L (ref 5–34)
Albumin: 3.7 g/dL (ref 3.5–5.0)
Alkaline Phosphatase: 66 U/L (ref 40–150)
Anion gap: 10 (ref 3–11)
BUN: 28 mg/dL — ABNORMAL HIGH (ref 7–26)
CO2: 23 mmol/L (ref 22–29)
Calcium: 10 mg/dL (ref 8.4–10.4)
Chloride: 106 mmol/L (ref 98–109)
Creatinine: 2.15 mg/dL — ABNORMAL HIGH (ref 0.60–1.10)
GFR, Est AFR Am: 27 mL/min — ABNORMAL LOW (ref >60)
GFR, Estimated: 23 mL/min — ABNORMAL LOW (ref >60)
Glucose, Bld: 98 mg/dL (ref 70–140)
Potassium: 4.1 mmol/L (ref 3.5–5.1)
Sodium: 139 mmol/L (ref 136–145)
Total Bilirubin: 0.4 mg/dL (ref 0.2–1.2)
Total Protein: 8 g/dL (ref 6.4–8.3)

## 2017-11-10 LAB — FERRITIN: Ferritin: 111 ng/mL (ref 9–269)

## 2017-11-10 LAB — IRON AND TIBC
Iron: 49 ug/dL (ref 41–142)
Saturation Ratios: 15 % — ABNORMAL LOW (ref 21–57)
TIBC: 329 ug/dL (ref 236–444)
UIBC: 279 ug/dL

## 2017-11-10 LAB — RETICULOCYTES
RBC.: 3.22 MIL/uL — ABNORMAL LOW (ref 3.70–5.45)
Retic Count, Absolute: 80.5 10*3/uL (ref 33.7–90.7)
Retic Ct Pct: 2.5 % — ABNORMAL HIGH (ref 0.7–2.1)

## 2017-11-10 NOTE — Progress Notes (Signed)
Hematology and Oncology Follow Up Visit  [name] [id number] 1955/11/23 62 y.o. 11/10/2017   Principle Diagnosis:  Recurrent iron deficiency anemia secondary to ulcerative colitis Right cephalic/basilic vein thrombus - transiently positive lupus anticoagulant  Current Therapy:   IV iron as indicated - dose to be given on 06/23/2017   Interim History:  [name] is here today for follow-up. She is symptomatic with fatigue. She has ongoing intermittent flares with with UC but feels this have been a little improved with Entyvio.  She has occasional blood on her toilet tissue when she has something acidic to eat. No other episodes of bleeding. No bruising or petechiae.  In January her iron saturation was 22% with ferritin 144.  No fever, chills, n/v, cough, rash, dizziness, SOB, chest pain, palpitations or changes in bowel or bladder habits.  She has some stool incontinence due to only having a sphincter and no rectum. No lymphadenopathy noted on exam.  No swelling or tenderness in her extremities. The numbness and tingling in her hands and feet is unchanged.  She has a good appetite and is staying well hydrated. Her weight is stable.   ECOG Performance Status: 1 - Symptomatic but completely ambulatory  Medications:  Allergies as of 11/10/2017      Reactions   Morphine Other (See Comments)   Gives me crazy dreams   Sulfonamide Derivatives Itching, Swelling, Rash      Medication List        Accurate as of 11/10/17  9:00 AM. Always use your most recent med list.          AMBULATORY NON FORMULARY MEDICATION Medication Name: Nitroglycerine ointment 0.125 %  Apply a pea sized amount internally four times daily. Dispense 30 GM zero refill   BIOTIN PO Take 1 tablet by mouth daily.   DULoxetine 60 MG capsule Commonly known as:  CYMBALTA TAKE 2 CAPSULES BY MOUTH  DAILY   febuxostat 40 MG tablet Commonly known as:  ULORIC Take 1 tablet (40 mg total) by mouth daily.   folic  acid 1 MG tablet Commonly known as:  FOLVITE Take 2 tablets (2 mg total) by mouth daily.   GAS-X PO Take 1 tablet by mouth at bedtime.   HYDROcodone-acetaminophen 5-325 MG tablet Commonly known as:  NORCO/VICODIN Take 2 tablets by mouth every 4 (four) hours as needed for moderate pain or severe pain.   Melatonin 5 MG Tabs Take 5 mg at bedtime by mouth.   metoprolol succinate 50 MG 24 hr tablet Commonly known as:  TOPROL-XL Take 50 mg by mouth daily.   ranitidine 150 MG tablet Commonly known as:  ZANTAC Take 1 tablet (150 mg total) by mouth 2 (two) times daily.   sodium chloride 0.9 % SOLN 250 mL with vedolizumab 300 MG SOLR 300 mg Inject 300 mg into the vein every 30 (thirty) days.   torsemide 20 MG tablet Commonly known as:  DEMADEX Take 20 mg by mouth daily.   vitamin B-12 100 MCG tablet Commonly known as:  CYANOCOBALAMIN Take 100 mcg by mouth daily.   Vitamin D-3 5000 units Tabs Take 1 tablet by mouth daily.   Vitamin D2 2000 units Tabs Take by mouth daily.       Allergies:  Allergies  Allergen Reactions  . Morphine Other (See Comments)    Gives me crazy dreams  . Sulfonamide Derivatives Itching, Swelling and Rash    Past Medical History, Surgical history, Social history, and Family History were  reviewed and updated.  Review of Systems: All other 10 point review of systems is negative.   Physical Exam:  weight is 187 lb (84.8 kg). Her oral temperature is 98.3 F (36.8 C). Her blood pressure is 147/93 (abnormal) and her pulse is 94. Her respiration is 18 and oxygen saturation is 98%.   Wt Readings from Last 3 Encounters:  11/10/17 187 lb (84.8 kg)  10/06/17 184 lb (83.5 kg)  09/21/17 189 lb (85.7 kg)    Ocular: Sclerae unicteric, pupils equal, round and reactive to light Ear-nose-throat: Oropharynx clear, dentition fair Lymphatic: No cervical, supraclavicular or axillary adenopathy Lungs no rales or rhonchi, good excursion bilaterally Heart  regular rate and rhythm, no murmur appreciated Abd soft, nontender, positive bowel sounds, no liver or spleen tip palpated on exam, no fluid wave  MSK no focal spinal tenderness, no joint edema Neuro: non-focal, well-oriented, appropriate affect Breasts: Deferred   Lab Results  Component Value Date   WBC 7.7 11/10/2017   HGB 11.2 (L) 09/08/2017   HCT 32.6 (L) 11/10/2017   MCV 103.8 (H) 11/10/2017   PLT 312 11/10/2017   Lab Results  Component Value Date   FERRITIN 144 10/06/2017   IRON 70 10/06/2017   TIBC 319 10/06/2017   UIBC 249 10/06/2017   IRONPCTSAT 22 10/06/2017   Lab Results  Component Value Date   RETICCTPCT 2.6 (H) 10/06/2017   RBC 3.14 (L) 11/10/2017   No results found for: KPAFRELGTCHN, LAMBDASER, KAPLAMBRATIO No results found for: IGGSERUM, IGA, IGMSERUM No results found for: Odetta Pink, SPEI   Chemistry      Component Value Date/Time   NA 142 10/06/2017 0753   NA 142 09/08/2017 0923   K 4.1 10/06/2017 0753   K 3.7 09/08/2017 0923   CL 107 10/06/2017 0753   CL 97 (L) 09/08/2017 0923   CO2 24 10/06/2017 0753   CO2 28 09/08/2017 0923   BUN 34 (H) 10/06/2017 0753   BUN 49 (H) 09/08/2017 0923   CREATININE 2.14 (H) 10/06/2017 0753   CREATININE 3.1 (HH) 09/08/2017 0923      Component Value Date/Time   CALCIUM 9.7 10/06/2017 0753   CALCIUM 9.9 09/08/2017 0923   CALCIUM 8.9 04/12/2013 0851   ALKPHOS 77 10/06/2017 0753   ALKPHOS 89 (H) 09/08/2017 0923   AST 21 10/06/2017 0753   ALT 12 10/06/2017 0753   ALT 22 09/08/2017 0923   BILITOT 0.3 10/06/2017 0753      Impression and Plan: [name] is a very pleasant 62 yo caucasian female with iron deficiency anemia secondary to ulcerative colitis and intermittent GI/rectal bleeding.  She is feeling fatigued. We will see what her iron studies show and bring her back in for infusion if needed.  We will go ahead and plan to see her back in another 2 months  for follow-up.  She will contact our office with any questions or concerns. We can certainly see her sooner if need be.    Laverna Peace, NP 3/7/20199:00 AM

## 2017-11-11 DIAGNOSIS — K5100 Ulcerative (chronic) pancolitis without complications: Secondary | ICD-10-CM | POA: Diagnosis not present

## 2017-11-23 DIAGNOSIS — I129 Hypertensive chronic kidney disease with stage 1 through stage 4 chronic kidney disease, or unspecified chronic kidney disease: Secondary | ICD-10-CM | POA: Diagnosis not present

## 2017-12-09 DIAGNOSIS — K5100 Ulcerative (chronic) pancolitis without complications: Secondary | ICD-10-CM | POA: Diagnosis not present

## 2017-12-19 IMAGING — US US EXTREM  UP VENOUS*R*
1 series · 13 of 24 positions shown · non-contrast
Comparison: [DATE]

CLINICAL DATA: Follow-up right upper extremity thrombus.



[Series 1: us extrem up venous*right* · 13 of 34 slices shown]
[im 1/34]
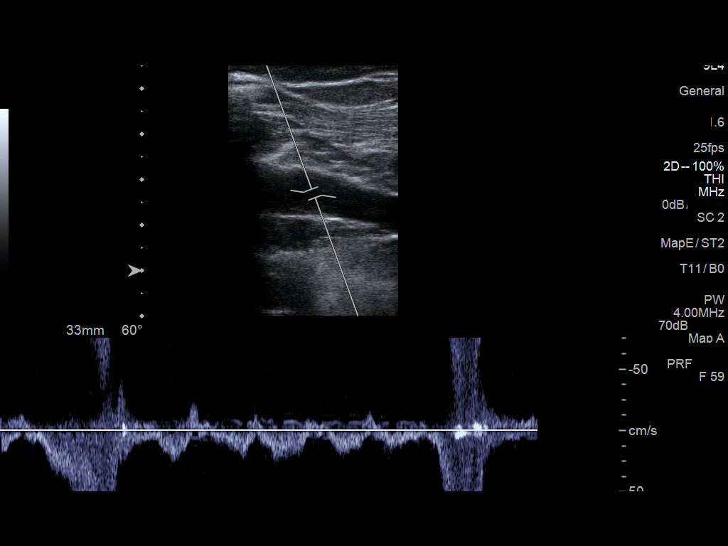
[im 3/34]
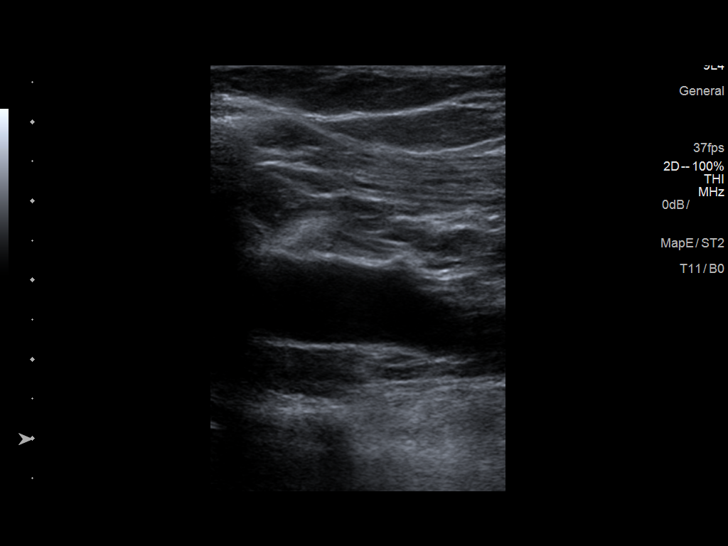
[im 6/34]
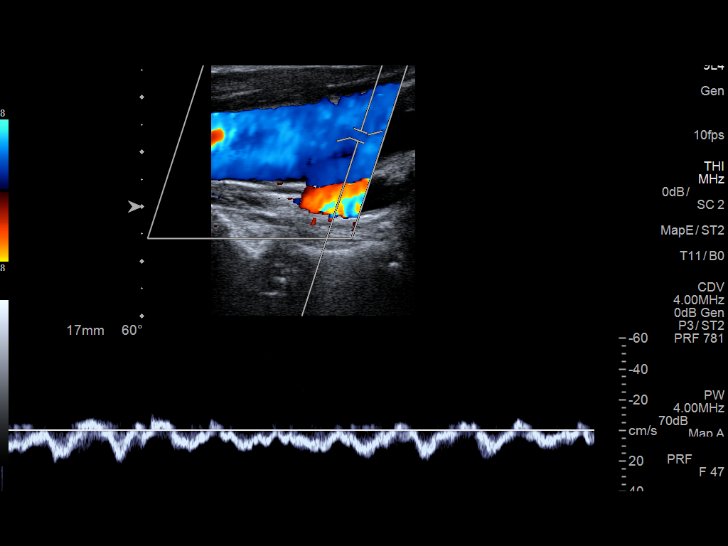
[im 9/34]
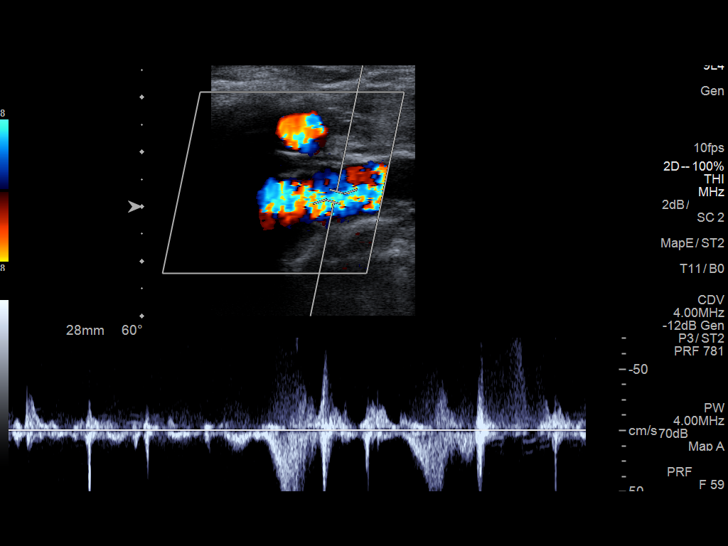
[im 12/34]
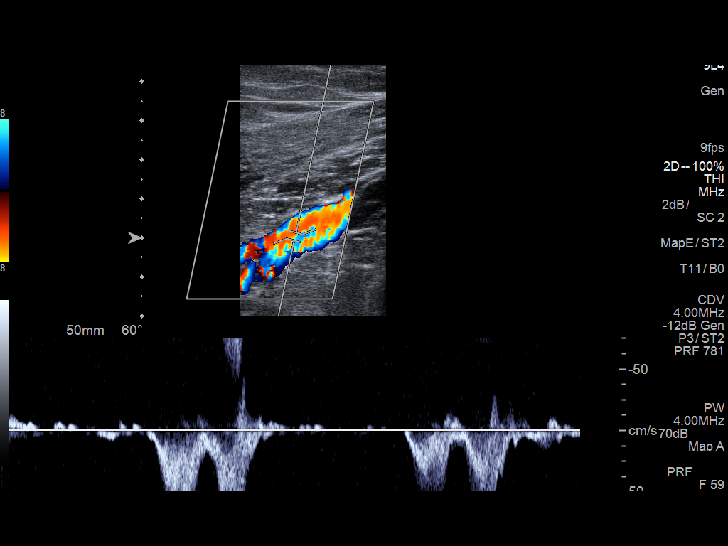
[im 15/34]
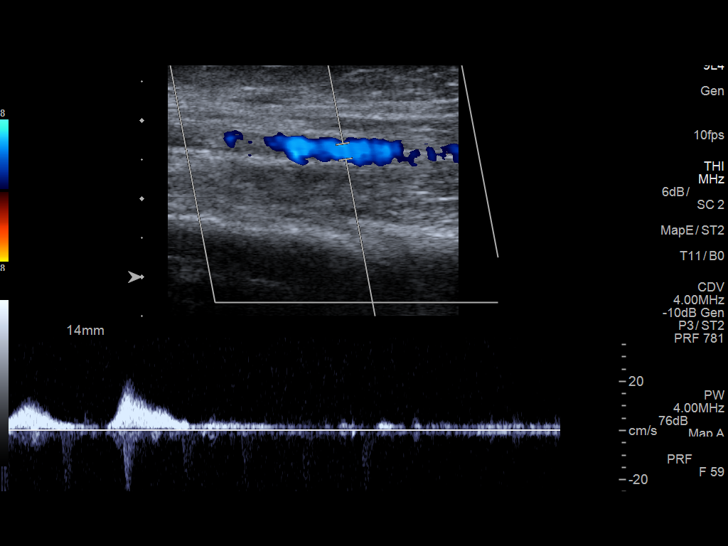
[im 18/34]
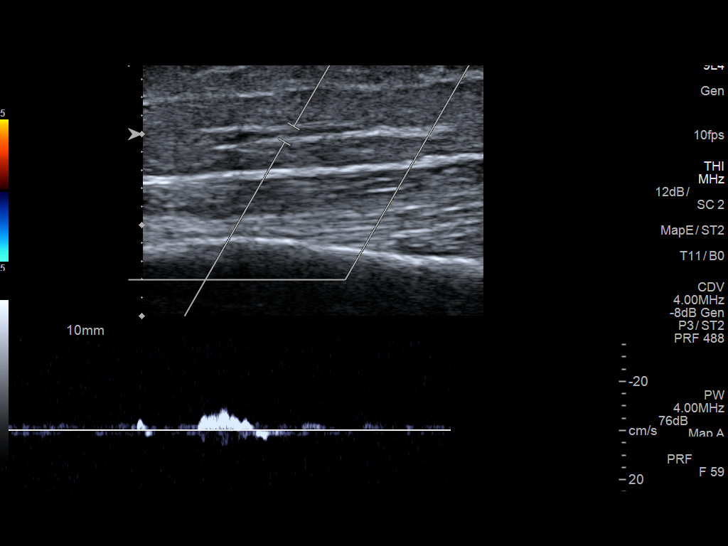
[im 19/34]
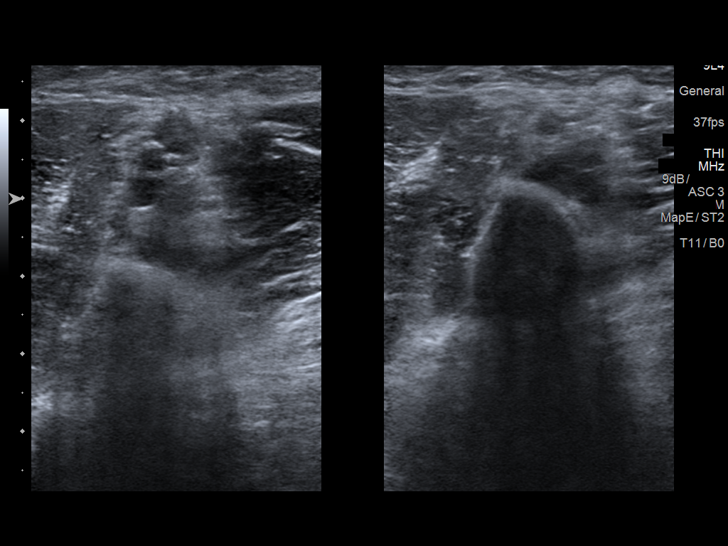
[im 22/34]
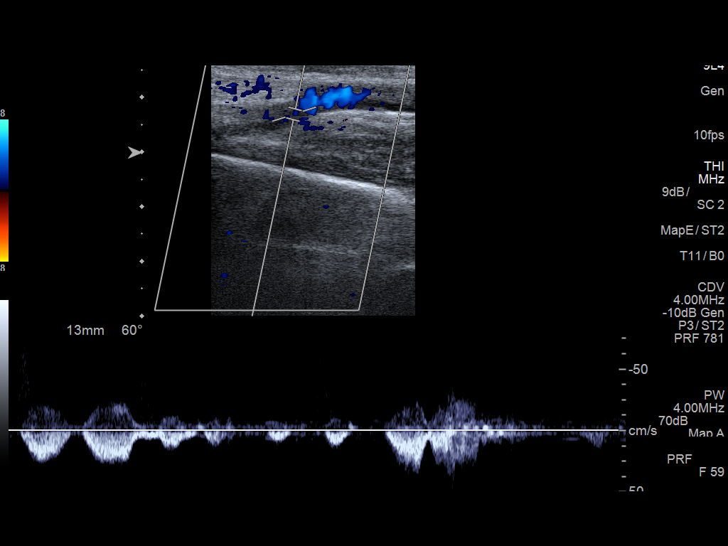
[im 25/34]
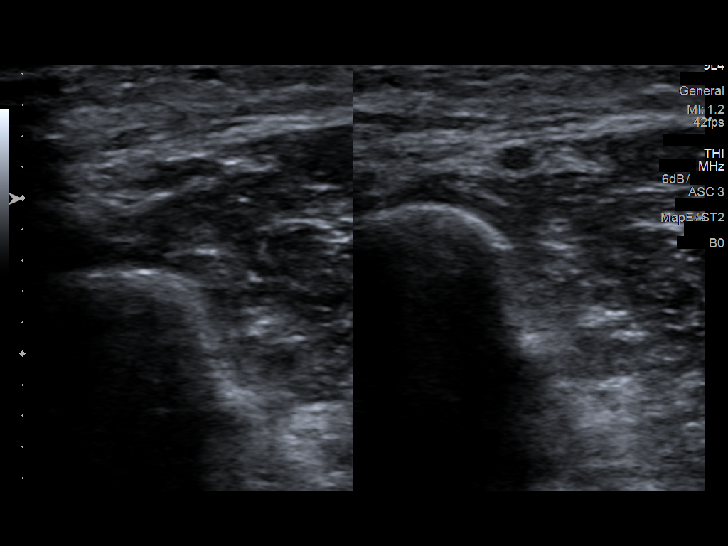
[im 28/34]
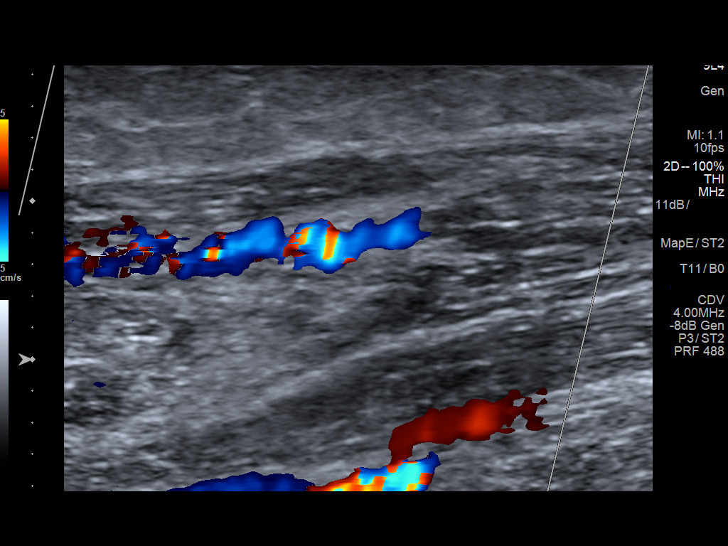
[im 31/34]
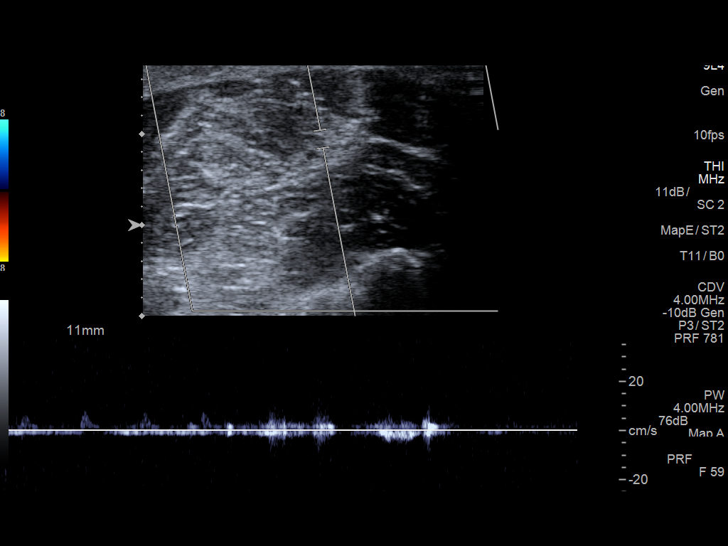
[im 34/34]
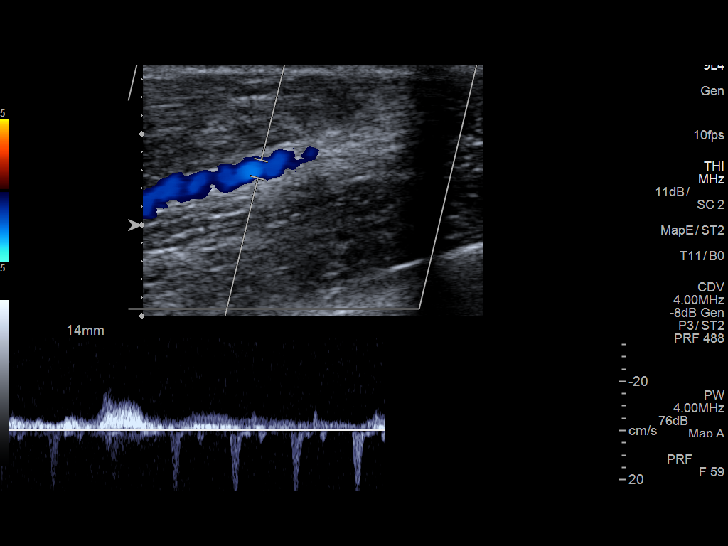

[13 of 24 positions shown; findings below may reference images not displayed]

FINDINGS: Contralateral Subclavian Vein: Respiratory phasicity is normal and
symmetric with the symptomatic side. No evidence of thrombus. Normal
compressibility.

Internal Jugular Vein: No evidence of thrombus. Normal
compressibility, respiratory phasicity and response to augmentation.

Subclavian Vein: No evidence of thrombus. Normal compressibility,
respiratory phasicity and response to augmentation.

Axillary Vein: No evidence of thrombus. Normal compressibility,
respiratory phasicity and response to augmentation.

Cephalic Vein: No evidence of thrombus. Normal compressibility,
respiratory phasicity and response to augmentation.

Basilic Vein: No evidence of thrombus. Normal compressibility,
respiratory phasicity and response to augmentation.

Brachial Veins: No evidence of thrombus. Normal compressibility,
respiratory phasicity and response to augmentation.

Radial Veins: No evidence of thrombus. Normal compressibility,
respiratory phasicity and response to augmentation.

Ulnar Veins: No evidence of thrombus. Normal compressibility,
respiratory phasicity and response to augmentation.

Venous Reflux:  None visualized.

Other Findings:  None visualized.
IMPRESSION: No evidence of DVT within the right upper extremity. Previously seen
basilic and cephalic thrombus has resolved in the interval.

## 2018-01-05 DIAGNOSIS — K5100 Ulcerative (chronic) pancolitis without complications: Secondary | ICD-10-CM | POA: Diagnosis not present

## 2018-01-06 ENCOUNTER — Ambulatory Visit (INDEPENDENT_AMBULATORY_CARE_PROVIDER_SITE_OTHER): Payer: Medicare Other | Admitting: Medical

## 2018-01-06 ENCOUNTER — Ambulatory Visit: Payer: Self-pay | Admitting: *Deleted

## 2018-01-06 ENCOUNTER — Encounter: Payer: Self-pay | Admitting: Medical

## 2018-01-06 VITALS — BP 154/83 | HR 104 | Temp 98.3°F | Resp 16 | Ht 63.0 in | Wt 187.8 lb

## 2018-01-06 DIAGNOSIS — R350 Frequency of micturition: Secondary | ICD-10-CM | POA: Diagnosis not present

## 2018-01-06 DIAGNOSIS — R319 Hematuria, unspecified: Secondary | ICD-10-CM

## 2018-01-06 DIAGNOSIS — R944 Abnormal results of kidney function studies: Secondary | ICD-10-CM

## 2018-01-06 LAB — POC URINALSYSI DIPSTICK (AUTOMATED)
Bilirubin, UA: NEGATIVE
Glucose, UA: NEGATIVE
Ketones, UA: NEGATIVE
Nitrite, UA: POSITIVE
Spec Grav, UA: 1.015 (ref 1.010–1.025)
Urobilinogen, UA: 1 E.U./dL
pH, UA: 6 (ref 5.0–8.0)

## 2018-01-06 LAB — CBC WITH DIFFERENTIAL/PLATELET
Basophils Absolute: 0.1 10*3/uL (ref 0.0–0.1)
Basophils Relative: 0.7 % (ref 0.0–3.0)
Eosinophils Absolute: 0.3 10*3/uL (ref 0.0–0.7)
Eosinophils Relative: 2.6 % (ref 0.0–5.0)
HCT: 28.9 % — ABNORMAL LOW (ref 36.0–46.0)
Hemoglobin: 9.8 g/dL — ABNORMAL LOW (ref 12.0–15.0)
Lymphocytes Relative: 18.6 % (ref 12.0–46.0)
Lymphs Abs: 1.9 10*3/uL (ref 0.7–4.0)
MCHC: 34 g/dL (ref 30.0–36.0)
MCV: 98.4 fl (ref 78.0–100.0)
Monocytes Absolute: 0.6 10*3/uL (ref 0.1–1.0)
Monocytes Relative: 5.7 % (ref 3.0–12.0)
Neutro Abs: 7.6 10*3/uL (ref 1.4–7.7)
Neutrophils Relative %: 72.4 % (ref 43.0–77.0)
Platelets: 319 10*3/uL (ref 150.0–400.0)
RBC: 2.94 Mil/uL — ABNORMAL LOW (ref 3.87–5.11)
RDW: 14.1 % (ref 11.5–15.5)
WBC: 10.5 10*3/uL (ref 4.0–10.5)

## 2018-01-06 LAB — COMPREHENSIVE METABOLIC PANEL
ALT: 11 U/L (ref 0–35)
AST: 17 U/L (ref 0–37)
Albumin: 3.9 g/dL (ref 3.5–5.2)
Alkaline Phosphatase: 71 U/L (ref 39–117)
BUN: 24 mg/dL — ABNORMAL HIGH (ref 6–23)
CO2: 26 mEq/L (ref 19–32)
Calcium: 9.2 mg/dL (ref 8.4–10.5)
Chloride: 102 mEq/L (ref 96–112)
Creatinine, Ser: 1.96 mg/dL — ABNORMAL HIGH (ref 0.40–1.20)
GFR: 27.28 mL/min — ABNORMAL LOW (ref >60.00)
Glucose, Bld: 137 mg/dL — ABNORMAL HIGH (ref 70–99)
Potassium: 3.7 mEq/L (ref 3.5–5.1)
Sodium: 137 mEq/L (ref 135–145)
Total Bilirubin: 0.3 mg/dL (ref 0.2–1.2)
Total Protein: 7.3 g/dL (ref 6.0–8.3)

## 2018-01-06 MED ORDER — CEPHALEXIN 250 MG PO TABS: 250.0000 mg | ORAL_TABLET | Freq: Two times a day (BID) | ORAL | 0 refills | Status: DC

## 2018-01-06 NOTE — Telephone Encounter (Signed)
I reviewed pt cmp today and resulted note. But will you make copy of cmp on Monday and fax copy to [facility]. Her neurologist is with them

## 2018-01-06 NOTE — Telephone Encounter (Signed)
Patient is passing blood in urine. Having urgency, after drinking water has calmed down some, did collect sample. Please advise     Patient has contact both her PCP and her urologist. [facility] is calling her back during triage- she wants to call us back- will wait for patient to call back to see if she has appointment scheduled with them.  Answer Assessment - Initial Assessment Questions 1. SYMPTOM: "What's the main symptom you're concerned about?" (e.g., frequency, incontinence)     Blood in urine 2. ONSET: "When did the  ________  start?"     This morning 3. PAIN: "Is there any pain?" If so, ask: "How bad is it?" (Scale: 1-10; mild, moderate, severe)     No pain with urination- did have some urgency  4. CAUSE: "What do you think is causing the symptoms?"     ? UTI- problems with kidney function 5. OTHER SYMPTOMS: "Do you have any other symptoms?" (e.g., fever, flank pain, blood in urine, pain with urination)     No other urinary symptom 6. PREGNANCY: "Is there any chance you are pregnant?" "When was your last menstrual period?"     n/a  Protocols used: URINARY [facility]

## 2018-01-06 NOTE — Progress Notes (Signed)
Subjective:    Patient ID: [name], female    DOB: [birth year], 62 y.o.   MRN: [id number]  HPI  Pt in with some blood urine today. The urine looked bright red. Pt urinated 4-5 times days and each time urinates urine is lighter red. Now it looks pink in office.  Pt has decreased kidney function. She sees nephrologist every 3 months. She is scheduled to see them February 09, 2018. Pt kidney MD is at [place].  No obvious uti signs or symptoms presently. Only frequent urination this am with slight urge when first woke.   Pt also mentioned 2 months ago fell in house. Dog tripped her.   No hx of chronic uti. She thinks maybe last uti had was 20 years ago.   Review of Systems  Constitutional: Negative for chills, fatigue and fever.  Respiratory: Negative for cough, chest tightness, shortness of breath and wheezing.   Cardiovascular: Negative for chest pain and palpitations.  Gastrointestinal: Negative for abdominal distention and anal bleeding.  Genitourinary: Positive for hematuria and urgency. Negative for difficulty urinating, dysuria, flank pain, frequency, genital sores and pelvic pain.       Only urgent when she first got up. Some more frequent more than usual.  Musculoskeletal: Negative for back pain.  Skin: Negative for rash.  Neurological: Negative for dizziness, seizures, syncope, weakness, numbness and headaches.  Hematological: Negative for adenopathy. Does not bruise/bleed easily.  Psychiatric/Behavioral: Negative for behavioral problems and confusion.    Past Medical History:  Diagnosis Date  . Abdominal pain    Hx  . Allergy   . Anal stenosis   . Anemia   . Anxiety   . Arthritis   . Asthma    patient does not have inhaler  . Blood in stool    Hx  . Blood in urine    Hx  . Blood transfusion without reported diagnosis   . De Quervain's tenosynovitis   . Depression   . Difficulty urinating    Hx  . Easy bruising   . Esophagitis   . Fainting    History  - resolved - due to dehydration  . Fatigue    Hx  . Fibroid    Hx  . Gastric polyp   . GERD (gastroesophageal reflux disease)   . 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 Hemorrhoids, external   . Hemorrhoids, internal   . Herpes, genital    vaginal treated 07/05/12 and pt states is resolved  . History of cervical dysplasia   . History of small bowel obstruction   . Hyperlipidemia    currently no meds  . Hyperparathyroidism   . Hypertension   . Hypokalemia    Hx  . 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 Obesity   . Ovarian cyst   . Poor dental hygiene   . Pulmonary nodule, [name]    [name] upper lobe  . Rectal bleeding    Hx  . Rectal pain    Hx  . Renal insufficiency    CKD - stage 2  . RLS (restless legs syndrome)    no meds  . Tooth infection 11/2016   [name] low  . Ulcerative colitis   . Visual disturbance    wears glasses  . Weakness generalized    Hx - patient denies generalized weakness  .  Wears dentures    upper only     Social History   Socioeconomic History  . Marital status: Divorced    Spouse name: Not on file  . Number of children: Not on file  . Years of education: Not on file  . Highest education level: Not on file  Occupational History  . Not on file  Social Needs  . Financial resource strain: Not on file  . Food insecurity:    Worry: Not on file    Inability: Not on file  . Transportation needs:    Medical: Not on file    Non-medical: Not on file  Tobacco Use  . Smoking status: Former Smoker    Packs/day: 1.00    Years: 4.00    Pack years: 4.00    Types: Cigarettes    Last attempt to quit: 05/21/1979    Years since quitting: 38.6  . Smokeless tobacco: Never Used  Substance and Sexual Activity  . Alcohol use: No    Alcohol/week: 0.0 oz  . Drug use: No  . Sexual activity: Yes    Birth control/protection: Post-menopausal     Comment: 1st intercourse 62 yo-Fewer than 5 partners  Lifestyle  . Physical activity:    Days per week: Not on file    Minutes per session: Not on file  . Stress: Not on file  Relationships  . Social connections:    Talks on phone: Not on file    Gets together: Not on file    Attends religious service: Not on file    Active member of club or organization: Not on file    Attends meetings of clubs or organizations: Not on file    Relationship status: Not on file  . Intimate partner violence:    Fear of current or ex partner: Not on file    Emotionally abused: Not on file    Physically abused: Not on file    Forced sexual activity: Not on file  Other Topics Concern  . Not on file  Social History Narrative  . Not on file    Past Surgical History:  Procedure Laterality Date  . ANAL DILATION    . CERVICAL BIOPSY  W/ LOOP ELECTRODE EXCISION    . CHOLECYSTECTOMY    . COLONOSCOPY     [name]  . fatty tumor removed from back     X 2  . HEMORRHOID SURGERY    . ILEOSTOMY CLOSURE    . RESTORATIVE PROCTOCOLECTOMY     with insertion of ileoanal J Pouch with loop ileostomy  . SIGMOIDOSCOPY    . TOTAL ABDOMINAL HYSTERECTOMY  1998   TAH/LSO  . TUBAL LIGATION    . UPPER GASTROINTESTINAL ENDOSCOPY     [name]    Family History  Problem Relation Age of Onset  . Ulcerative colitis Father   . Hypertension Father   . Heart attack Father   . Hypertension Mother   . Heart disease Mother        s/p pci  . Ulcerative colitis Daughter   . Irritable bowel syndrome Unknown        grandchildren  . Diabetes Sister   . Cancer Sister        uterine  . Cancer Maternal Uncle        LUNG  . Colon cancer Neg Hx   . Esophageal cancer Neg Hx   . Stomach cancer Neg Hx   . Rectal cancer Neg Hx     Allergies  Allergen Reactions  . Morphine Other (See Comments)    Gives me crazy dreams  . Sulfa Antibiotics Hives  . Sulfonamide Derivatives Itching, Swelling and Rash    Current Outpatient  Medications on File Prior to Visit  Medication Sig Dispense Refill  . AMBULATORY NON FORMULARY MEDICATION Medication Name: Nitroglycerine ointment 0.125 %  Apply a pea sized amount internally four times daily. Dispense 30 GM zero refill (Patient taking differently: Medication Name: Nitroglycerine ointment 0.125 %  Apply a pea sized amount internally four times daily-PRN Dispense 30 GM zero refill) 30 g 3  . BIOTIN PO Take 1 tablet by mouth daily.     . Cholecalciferol (VITAMIN D-3) 5000 units TABS Take 1 tablet by mouth daily.    . DULoxetine (CYMBALTA) 60 MG capsule TAKE 2 CAPSULES BY MOUTH  DAILY 180 capsule 0  . Ergocalciferol (VITAMIN D2) 2000 units TABS Take by mouth daily.    . febuxostat (ULORIC) 40 MG tablet Take 1 tablet (40 mg total) by mouth daily. 90 tablet 0  . folic acid (FOLVITE) 1 MG tablet Take 2 tablets (2 mg total) by mouth daily. 180 tablet 3  . HYDROcodone-acetaminophen (NORCO/VICODIN) 5-325 MG tablet Take 2 tablets by mouth every 4 (four) hours as needed for moderate pain or severe pain. 90 tablet 0  . Melatonin 5 MG TABS Take 5 mg at bedtime by mouth.    . metoprolol succinate (TOPROL-XL) 50 MG 24 hr tablet Take 50 mg by mouth 2 (two) times daily.     . ranitidine (ZANTAC) 150 MG tablet Take 1 tablet (150 mg total) by mouth 2 (two) times daily. 180 tablet 0  . Simethicone (GAS-X PO) Take 1 tablet by mouth at bedtime.     . sodium chloride 0.9 % SOLN 250 mL with vedolizumab 300 MG SOLR 300 mg Inject 300 mg into the vein every 30 (thirty) days. 300 mg 12  . torsemide (DEMADEX) 20 MG tablet Take 20 mg by mouth daily.    . vitamin B-12 (CYANOCOBALAMIN) 100 MCG tablet Take 100 mcg by mouth daily.      No current facility-administered medications on file prior to visit.     BP (!) 154/83   Pulse (!) 104   Temp 98.3 F (36.8 C) (Oral)   Resp 16   Ht 5\' 3"  (1.6 m)   Wt 187 lb 12.8 oz (85.2 kg)   SpO2 99%   BMI 33.27 kg/m       Objective:   Physical  Exam  General Appearance- Not in acute distress.  HEENT Eyes- Scleraeral/Conjuntiva-bilat- Not Yellow. Mouth & Throat- Normal.  Chest and Lung Exam Auscultation: Breath sounds:-Normal. Adventitious sounds:- No Adventitious sounds.  Cardiovascular Auscultation:Rythm - Regular. Heart Sounds -Normal heart sounds.  Abdomen Inspection:-Inspection Normal.  Palpation/Perucssion: Palpation and Percussion of the abdomen reveal- Non Tender, No Rebound tenderness, No rigidity(Guarding) and No Palpable abdominal masses.  Liver:-Normal.  Spleen:- Normal.    Back- no cva tenderness.      Assessment & Plan:  For your recent frequent urination urgency and blood in your urine, I did order CMP.  Please get that today and will try to fax results over to your nephrologist before they close.  We will try to update you as well call or MyChart regarding results.  You have very rare history of UTI.  If you get worse symptoms consistent with UTI then start low-dose Keflex 250 mg twice daily.  Your urine culture is being sent out.  If  you start Keflex but have worse symptoms despite treatment then be seen in the ED.  Did give you a low dose antibiotic not entirely clear you have a UTI and a dosage adjustment made due to your kidney function.  Would recommend follow-up in 1 week to see if you have been high blood persist in your urine.  If no change in your GFR, your culture comes back negative and blood persists then I think you would benefit from urology appointment.  In that event you might need cystoscopy.  Also if you present with kidney stone type symptoms and recommend ED evaluation as well  Mackie Pai, PA-C

## 2018-01-06 NOTE — Patient Instructions (Addendum)
For your recent frequent urination urgency and blood in your urine, I did order CMP.  Please get that today and will try to fax results over to your nephrologsit before they close.  We will try to update you as well call or MyChart regarding results.  You have very rare history of UTI.  If you get worse symptoms consistent with UTI then start low-dose Keflex 250 mg twice daily.  Your urine culture is being sent out.  If you start Keflex but have worse symptoms despite treatment then be seen in the ED.  Did give you a low dose antibiotic not entirely clear you have a UTI and a dosage adjustment made due to your kidney function.  Would recommend follow-up in 1 week to see if you have been high blood persist in your urine.  If no change in your GFR, your culture comes back negative and blood persists then I think you would benefit from urology appointment.  In that event you might need cystoscopy.  Also if you present with kidney stone type symptoms and recommend ED evaluation as well

## 2018-01-06 NOTE — Telephone Encounter (Signed)
[facility] would like PCP to see patient today- will make her an appointment.

## 2018-01-06 NOTE — Telephone Encounter (Signed)
FYI to [facility].

## 2018-01-08 LAB — URINE CULTURE
MICRO NUMBER:: 90542587
SPECIMEN QUALITY:: ADEQUATE

## 2018-01-10 ENCOUNTER — Telehealth: Payer: Self-pay | Admitting: Gastroenterology

## 2018-01-10 ENCOUNTER — Other Ambulatory Visit: Payer: Self-pay

## 2018-01-10 MED ORDER — TINIDAZOLE 500 MG PO TABS: 500.0000 mg | ORAL_TABLET | Freq: Two times a day (BID) | ORAL | 0 refills | Status: DC

## 2018-01-10 MED ORDER — CIPROFLOXACIN HCL 500 MG PO TABS: 500.0000 mg | ORAL_TABLET | Freq: Every day | ORAL | 0 refills | Status: DC

## 2018-01-10 NOTE — Telephone Encounter (Signed)
Spoke to patient, last Entyvio infusion on 01/05/18. She had prior to infusion and has continued since, then left lower abdominal pain, frequency of stools, somewhat looser. Denies any fever. Scheduled her appointment in July to follow up, please advise in the meantime.

## 2018-01-10 NOTE — Telephone Encounter (Signed)
Left message for patient with Dr. Doyne Keel recommendations, also sent her a message through [facility]. Rx's sent to her pharmacy.

## 2018-01-10 NOTE — Telephone Encounter (Signed)
Sorry to hear some of her symptoms have recurred. 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 Why don't we give her another course of Tinidazole 500mg  BID and Ciprofloxacin 500mg  once daily for one month, and I'll see her in follow up at clinic. If no improvement or worsening on this regimen she should contact us in the interim.

## 2018-01-11 ENCOUNTER — Ambulatory Visit (INDEPENDENT_AMBULATORY_CARE_PROVIDER_SITE_OTHER): Payer: Medicare Other | Admitting: Medical

## 2018-01-11 ENCOUNTER — Encounter: Payer: Self-pay | Admitting: Medical

## 2018-01-11 VITALS — BP 148/89 | HR 102 | Temp 98.2°F | Resp 16 | Ht 63.0 in | Wt 186.0 lb

## 2018-01-11 DIAGNOSIS — R319 Hematuria, unspecified: Secondary | ICD-10-CM

## 2018-01-11 DIAGNOSIS — N390 Urinary tract infection, site not specified: Secondary | ICD-10-CM | POA: Diagnosis not present

## 2018-01-11 NOTE — Patient Instructions (Signed)
By urine culture you do have a E. coli present.  You did not take the Keflex.  Your GI doctor already wrote you for Cipro and that is on the sensitivity report.  I still want to repeat your urine in approximately 10 days to see if the blood in the urine persists.  If that were the case then would refer you to urologist for probable cystoscopy.  You do mention history of some blood in your urine previously but no work-up done.  So we will proceed with caution.  Follow-up in 10 days or as needed.(When you call to schedule the lab you for today.  Just appointment with the lab)

## 2018-01-11 NOTE — Progress Notes (Signed)
Subjective:    Patient ID: [name], female    DOB: [birth year], 62 y.o.   MRN: [id number]  HPI  Pt in today. Pt had recent uti by culture. I had written her keflex pending urine culture. Her urine looked suspicious and confirmed by culture. She never took keflex. 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 Jeanette Safer MD yesterday.   Pt kidney function looked stable compared to prior levels. I sent result note and asked staff to send the result to her nephrologist.   She states urine cleared day she saw me. No further recurrent gross blood in urine.    Review of Systems  Constitutional: Negative for chills, fatigue and fever.  Respiratory: Negative for cough, chest tightness and wheezing.   Cardiovascular: Negative for chest pain and palpitations.  Musculoskeletal: Negative for back pain.  Skin: Negative for rash.  Neurological: Negative for dizziness and headaches.  Hematological: Negative for adenopathy. Does not bruise/bleed easily.  Psychiatric/Behavioral: Negative for agitation and confusion.    Past Medical History:  Diagnosis Date  . Abdominal pain    Hx  . Allergy   . Anal stenosis   . Anemia   . Anxiety   . Arthritis   . Asthma    patient does not have inhaler  . Blood in stool    Hx  . Blood in urine    Hx  . Blood transfusion without reported diagnosis   . De Quervain's tenosynovitis   . Depression   . Difficulty urinating    Hx  . Easy bruising   . Esophagitis   . Fainting    History - resolved - due to dehydration  . Fatigue    Hx  . Fibroid    Hx  . Gastric polyp   . GERD (gastroesophageal reflux disease)   . 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 Hemorrhoids, external   . Hemorrhoids, internal   . Herpes, genital    vaginal treated 07/05/12 and pt states is resolved  . History of cervical dysplasia   . History of small bowel obstruction   . Hyperlipidemia    currently no meds  . Hyperparathyroidism   . Hypertension    . Hypokalemia    Hx  . 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 Obesity   . Ovarian cyst   . Poor dental hygiene   . Pulmonary nodule, [name]    [name] upper lobe  . Rectal bleeding    Hx  . Rectal pain    Hx  . Renal insufficiency    CKD - stage 2  . RLS (restless legs syndrome)    no meds  . Tooth infection 11/2016   [name] low  . Ulcerative colitis   . Visual disturbance    wears glasses  . Weakness generalized    Hx - patient denies generalized weakness  . Wears dentures    upper only     Social History   Socioeconomic History  . Marital status: Divorced    Spouse name: Not on file  . Number of children: Not on file  . Years of education: Not on file  . Highest education level: Not on file  Occupational History  . Not on file  Social Needs  . Financial resource strain: Not on file  . Food insecurity:  Worry: Not on file    Inability: Not on file  . Transportation needs:    Medical: Not on file    Non-medical: Not on file  Tobacco Use  . Smoking status: Former Smoker    Packs/day: 1.00    Years: 4.00    Pack years: 4.00    Types: Cigarettes    Last attempt to quit: 05/21/1979    Years since quitting: 38.6  . Smokeless tobacco: Never Used  Substance and Sexual Activity  . Alcohol use: No    Alcohol/week: 0.0 oz  . Drug use: No  . Sexual activity: Yes    Birth control/protection: Post-menopausal    Comment: 1st intercourse 62 yo-Fewer than 5 partners  Lifestyle  . Physical activity:    Days per week: Not on file    Minutes per session: Not on file  . Stress: Not on file  Relationships  . Social connections:    Talks on phone: Not on file    Gets together: Not on file    Attends religious service: Not on file    Active member of club or organization: Not on file    Attends meetings of clubs or organizations: Not on file    Relationship status: Not on file   . Intimate partner violence:    Fear of current or ex partner: Not on file    Emotionally abused: Not on file    Physically abused: Not on file    Forced sexual activity: Not on file  Other Topics Concern  . Not on file  Social History Narrative  . Not on file    Past Surgical History:  Procedure Laterality Date  . ANAL DILATION    . CERVICAL BIOPSY  W/ LOOP ELECTRODE EXCISION    . CHOLECYSTECTOMY    . COLONOSCOPY     [name]  . fatty tumor removed from back     X 2  . HEMORRHOID SURGERY    . ILEOSTOMY CLOSURE    . RESTORATIVE PROCTOCOLECTOMY     with insertion of ileoanal J Pouch with loop ileostomy  . SIGMOIDOSCOPY    . TOTAL ABDOMINAL HYSTERECTOMY  1998   TAH/LSO  . TUBAL LIGATION    . UPPER GASTROINTESTINAL ENDOSCOPY     [name]    Family History  Problem Relation Age of Onset  . Ulcerative colitis Father   . Hypertension Father   . Heart attack Father   . Hypertension Mother   . Heart disease Mother        s/p pci  . Ulcerative colitis Daughter   . Irritable bowel syndrome Unknown        grandchildren  . Diabetes Sister   . Cancer Sister        uterine  . Cancer Maternal Uncle        LUNG  . Colon cancer Neg Hx   . Esophageal cancer Neg Hx   . Stomach cancer Neg Hx   . Rectal cancer Neg Hx     Allergies  Allergen Reactions  . Morphine Other (See Comments)    Gives me crazy dreams  . Sulfa Antibiotics Hives  . Sulfonamide Derivatives Itching, Swelling and Rash    Current Outpatient Medications on File Prior to Visit  Medication Sig Dispense Refill  . AMBULATORY NON FORMULARY MEDICATION Medication Name: Nitroglycerine ointment 0.125 %  Apply a pea sized amount internally four times daily. Dispense 30 GM zero refill (Patient taking differently: Medication Name: Nitroglycerine ointment 0.125 %  Apply a pea sized amount internally four times daily-PRN Dispense 30 GM zero refill) 30 g 3  . BIOTIN PO Take 1 tablet by mouth daily.     . Cephalexin  250 MG tablet Take 1 tablet (250 mg total) by mouth 2 (two) times daily. 6 tablet 0  . Cholecalciferol (VITAMIN D-3) 5000 units TABS Take 1 tablet by mouth daily.    . ciprofloxacin (CIPRO) 500 MG tablet Take 1 tablet (500 mg total) by mouth daily. 30 tablet 0  . DULoxetine (CYMBALTA) 60 MG capsule TAKE 2 CAPSULES BY MOUTH  DAILY 180 capsule 0  . Ergocalciferol (VITAMIN D2) 2000 units TABS Take by mouth daily.    . febuxostat (ULORIC) 40 MG tablet Take 1 tablet (40 mg total) by mouth daily. 90 tablet 0  . folic acid (FOLVITE) 1 MG tablet Take 2 tablets (2 mg total) by mouth daily. 180 tablet 3  . HYDROcodone-acetaminophen (NORCO/VICODIN) 5-325 MG tablet Take 2 tablets by mouth every 4 (four) hours as needed for moderate pain or severe pain. 90 tablet 0  . Melatonin 5 MG TABS Take 5 mg at bedtime by mouth.    . metoprolol succinate (TOPROL-XL) 50 MG 24 hr tablet Take 50 mg by mouth 2 (two) times daily.     . ranitidine (ZANTAC) 150 MG tablet Take 1 tablet (150 mg total) by mouth 2 (two) times daily. 180 tablet 0  . Simethicone (GAS-X PO) Take 1 tablet by mouth at bedtime.     . sodium chloride 0.9 % SOLN 250 mL with vedolizumab 300 MG SOLR 300 mg Inject 300 mg into the vein every 30 (thirty) days. 300 mg 12  . tinidazole (TINDAMAX) 500 MG tablet Take 1 tablet (500 mg total) by mouth 2 (two) times daily after a meal. 60 tablet 0  . torsemide (DEMADEX) 20 MG tablet Take 20 mg by mouth daily.    . vitamin B-12 (CYANOCOBALAMIN) 100 MCG tablet Take 100 mcg by mouth daily.      No current facility-administered medications on file prior to visit.     BP (!) 148/89   Pulse (!) 102   Temp 98.2 F (36.8 C) (Oral)   Resp 16   Ht 5\' 3"  (1.6 m)   Wt 186 lb (84.4 kg)   SpO2 99%   BMI 32.95 kg/m       Objective:   Physical Exam  General Appearance- Not in acute distress.  HEENT Eyes- Scleraeral/Conjuntiva-bilat- Not Yellow. Mouth & Throat- Normal.  Chest and Lung Exam Auscultation: Breath  sounds:-Normal. Adventitious sounds:- No Adventitious sounds.  Cardiovascular Auscultation:Rythm - Regular. Heart Sounds -Normal heart sounds.  Abdomen Inspection:-Inspection Normal.  Palpation/Perucssion: Palpation and Percussion of the abdomen reveal- Non Tender, No Rebound tenderness, No rigidity(Guarding) and No Palpable abdominal masses.  Liver:-Normal.  Spleen:- Normal.   Back- no cva tenderness.      Assessment & Plan:  By urine culture you do have a E. coli present.  You did not take the Keflex.  Your GI doctor already wrote you for Cipro and that is on the sensitivity report.  I still want to repeat your urine in approximately 10 days to see if the blood in the urine persists.  If that were the case then would refer you to urologist for probable cystoscopy.  You do mention history of some blood in your urine previously but no work-up done.  So we will proceed with caution.  Follow-up in 10 days or as needed.(When you  call to schedule the lab you for today.  Just appointment with the lab)  Mackie Pai, PA-C

## 2018-01-12 ENCOUNTER — Other Ambulatory Visit: Payer: Self-pay

## 2018-01-12 ENCOUNTER — Inpatient Hospital Stay: Payer: Medicare Other

## 2018-01-12 ENCOUNTER — Inpatient Hospital Stay: Payer: Medicare Other | Attending: Hematology & Oncology | Admitting: Family

## 2018-01-12 VITALS — BP 146/98 | HR 93 | Temp 98.2°F | Resp 18 | Wt 185.0 lb

## 2018-01-12 DIAGNOSIS — N390 Urinary tract infection, site not specified: Secondary | ICD-10-CM | POA: Diagnosis not present

## 2018-01-12 DIAGNOSIS — D500 Iron deficiency anemia secondary to blood loss (chronic): Secondary | ICD-10-CM | POA: Insufficient documentation

## 2018-01-12 DIAGNOSIS — K51911 Ulcerative colitis, unspecified with rectal bleeding: Secondary | ICD-10-CM | POA: Insufficient documentation

## 2018-01-12 DIAGNOSIS — D6862 Lupus anticoagulant syndrome: Secondary | ICD-10-CM | POA: Insufficient documentation

## 2018-01-12 LAB — RETICULOCYTES
RBC.: 3.24 MIL/uL — ABNORMAL LOW (ref 3.70–5.45)
Retic Count, Absolute: 68 K/uL (ref 33.7–90.7)
Retic Ct Pct: 2.1 % (ref 0.7–2.1)

## 2018-01-12 LAB — CBC WITH DIFFERENTIAL (CANCER CENTER ONLY)
Basophils Absolute: 0 10*3/uL (ref 0.0–0.1)
Basophils Relative: 1 %
Eosinophils Absolute: 0.4 10*3/uL (ref 0.0–0.5)
Eosinophils Relative: 4 %
HCT: 33.3 % — ABNORMAL LOW (ref 34.8–46.6)
Hemoglobin: 10.7 g/dL — ABNORMAL LOW (ref 11.6–15.9)
Lymphocytes Relative: 30 %
Lymphs Abs: 2.7 10*3/uL (ref 0.9–3.3)
MCH: 32.6 pg (ref 26.0–34.0)
MCHC: 32.1 g/dL (ref 32.0–36.0)
MCV: 101.5 fL — ABNORMAL HIGH (ref 81.0–101.0)
Monocytes Absolute: 0.9 10*3/uL (ref 0.1–0.9)
Monocytes Relative: 10 %
Neutro Abs: 4.9 10*3/uL (ref 1.5–6.5)
Neutrophils Relative %: 55 %
Platelet Count: 347 10*3/uL (ref 145–400)
RBC: 3.28 MIL/uL — ABNORMAL LOW (ref 3.70–5.32)
RDW: 13.5 % (ref 11.1–15.7)
WBC Count: 8.9 10*3/uL (ref 3.9–10.0)

## 2018-01-12 LAB — CMP (CANCER CENTER ONLY)
ALT: 15 U/L (ref 0–55)
AST: 20 U/L (ref 5–34)
Albumin: 3.7 g/dL (ref 3.5–5.0)
Alkaline Phosphatase: 72 U/L (ref 40–150)
Anion gap: 9 (ref 3–11)
BUN: 33 mg/dL — ABNORMAL HIGH (ref 7–26)
CO2: 27 mmol/L (ref 22–29)
Calcium: 10.3 mg/dL (ref 8.4–10.4)
Chloride: 103 mmol/L (ref 98–109)
Creatinine: 2.36 mg/dL — ABNORMAL HIGH (ref 0.60–1.10)
GFR, Est AFR Am: 24 mL/min — ABNORMAL LOW (ref >60)
GFR, Estimated: 21 mL/min — ABNORMAL LOW (ref >60)
Glucose, Bld: 103 mg/dL (ref 70–140)
Potassium: 4 mmol/L (ref 3.5–5.1)
Sodium: 139 mmol/L (ref 136–145)
Total Bilirubin: 0.3 mg/dL (ref 0.2–1.2)
Total Protein: 7.9 g/dL (ref 6.4–8.3)

## 2018-01-12 LAB — IRON AND TIBC
Iron: 59 ug/dL (ref 41–142)
Saturation Ratios: 17 % — ABNORMAL LOW (ref 21–57)
TIBC: 353 ug/dL (ref 236–444)
UIBC: 294 ug/dL

## 2018-01-12 LAB — FERRITIN: Ferritin: 64 ng/mL (ref 9–269)

## 2018-01-12 NOTE — Progress Notes (Signed)
Hematology and Oncology Follow Up Visit  [name] [id number] Jun 18, 1956 62 y.o. 01/12/2018   Principle Diagnosis:  Recurrent iron deficiency anemia secondary to ulcerative colitis Right cephalic/basilic vein thrombus - transiently positive lupus anticoagulant  Current Therapy:   IV iron as indicated - last dose given on 06/23/2017   Interim History: [name] is here today for follow-up. She is still feeling fatigued and has noticed occasional palpitations. Hgb is stable at 10,7, MCV 101.  She noted blood in her urine last week and tested positive for UTI. 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  She started treatment with Entyvio last week for her UC. She denies any recent flare.  She has had no other episodes of bleeding, no bruising or petechiae.  No fever, chills, n/v, cough, rash, dizziness, SOB, chest pain, abdominal pain or changes in bladder habits.  She has loose stools at baseline, no active diarrhea.  No lymphadenopathy noted on exam.  The numbness and tingling in her extremities is unchanged. No swelling or tenderness in her extremities at this time.  She has a good appetite and is staying well hydrated. Her weight is stable.   ECOG Performance Status: 1 - Symptomatic but completely ambulatory  Medications:  Allergies as of 01/12/2018      Reactions   Morphine Other (See Comments)   Gives me crazy dreams   Sulfa Antibiotics Hives   Sulfonamide Derivatives Itching, Swelling, Rash      Medication List        Accurate as of 01/12/18  8:53 AM. Always use your most recent med list.          AMBULATORY NON FORMULARY MEDICATION Medication Name: Nitroglycerine ointment 0.125 %  Apply a pea sized amount internally four times daily. Dispense 30 GM zero refill   BIOTIN PO Take 1 tablet by mouth daily.   ciprofloxacin 500 MG tablet Commonly known as:  CIPRO Take 1 tablet (500 mg total) by mouth daily.   DULoxetine 60 MG capsule Commonly known as:   CYMBALTA TAKE 2 CAPSULES BY MOUTH  DAILY   febuxostat 40 MG tablet Commonly known as:  ULORIC Take 1 tablet (40 mg total) by mouth daily.   folic acid 1 MG tablet Commonly known as:  FOLVITE Take 2 tablets (2 mg total) by mouth daily.   GAS-X PO Take 1 tablet by mouth at bedtime.   HYDROcodone-acetaminophen 5-325 MG tablet Commonly known as:  NORCO/VICODIN Take 2 tablets by mouth every 4 (four) hours as needed for moderate pain or severe pain.   Melatonin 5 MG Tabs Take 5 mg at bedtime by mouth.   metoprolol succinate 50 MG 24 hr tablet Commonly known as:  TOPROL-XL Take 50 mg by mouth 2 (two) times daily.   ranitidine 150 MG tablet Commonly known as:  ZANTAC Take 1 tablet (150 mg total) by mouth 2 (two) times daily.   sodium chloride 0.9 % SOLN 250 mL with vedolizumab 300 MG SOLR 300 mg Inject 300 mg into the vein every 30 (thirty) days.   tinidazole 500 MG tablet Commonly known as:  TINDAMAX Take 1 tablet (500 mg total) by mouth 2 (two) times daily after a meal.   torsemide 20 MG tablet Commonly known as:  DEMADEX Take 20 mg by mouth daily.   vitamin B-12 100 MCG tablet Commonly known as:  CYANOCOBALAMIN Take 100 mcg by mouth daily.   Vitamin D-3 5000 units Tabs Take 1 tablet by mouth daily.  Vitamin D2 2000 units Tabs Take by mouth daily.       Allergies:  Allergies  Allergen Reactions  . Morphine Other (See Comments)    Gives me crazy dreams  . Sulfa Antibiotics Hives  . Sulfonamide Derivatives Itching, Swelling and Rash    Past Medical History, Surgical history, Social history, and Family History were reviewed and updated.  Review of Systems: All other 10 point review of systems is negative.   Physical Exam:  weight is 185 lb (83.9 kg). Her oral temperature is 98.2 F (36.8 C). Her blood pressure is 146/98 (abnormal) and her pulse is 93. Her respiration is 18 and oxygen saturation is 99%.   Wt Readings from Last 3 Encounters:  01/12/18 185  lb (83.9 kg)  01/11/18 186 lb (84.4 kg)  01/06/18 187 lb 12.8 oz (85.2 kg)    Ocular: Sclerae unicteric, pupils equal, round and reactive to light Ear-nose-throat: Oropharynx clear, dentition fair Lymphatic: No cervical, supraclavicular or axillary adenopathy Lungs no rales or rhonchi, good excursion bilaterally Heart regular rate and rhythm, no murmur appreciated Abd soft, nontender, positive bowel sounds, no liver or spleen tip palpated on exam, no fluid wave  MSK no focal spinal tenderness, no joint edema Neuro: non-focal, well-oriented, appropriate affect Breasts: Deferred   Lab Results  Component Value Date   WBC 8.9 01/12/2018   HGB 10.7 (L) 01/12/2018   HCT 33.3 (L) 01/12/2018   MCV 101.5 (H) 01/12/2018   PLT 347 01/12/2018   Lab Results  Component Value Date   FERRITIN 111 11/10/2017   IRON 49 11/10/2017   TIBC 329 11/10/2017   UIBC 279 11/10/2017   IRONPCTSAT 15 (L) 11/10/2017   Lab Results  Component Value Date   RETICCTPCT 2.5 (H) 11/10/2017   RBC 3.28 (L) 01/12/2018   No results found for: KPAFRELGTCHN, LAMBDASER, KAPLAMBRATIO No results found for: IGGSERUM, IGA, IGMSERUM No results found for: Kathrynn Ducking, MSPIKE, SPEI   Chemistry      Component Value Date/Time   NA 137 01/06/2018 1337   NA 142 09/08/2017 0923   K 3.7 01/06/2018 1337   K 3.7 09/08/2017 0923   CL 102 01/06/2018 1337   CL 97 (L) 09/08/2017 0923   CO2 26 01/06/2018 1337   CO2 28 09/08/2017 0923   BUN 24 (H) 01/06/2018 1337   BUN 49 (H) 09/08/2017 0923   CREATININE 1.96 (H) 01/06/2018 1337   CREATININE 2.15 (H) 11/10/2017 0804   CREATININE 3.1 (HH) 09/08/2017 0923      Component Value Date/Time   CALCIUM 9.2 01/06/2018 1337   CALCIUM 9.9 09/08/2017 0923   CALCIUM 8.9 04/12/2013 0851   ALKPHOS 71 01/06/2018 1337   ALKPHOS 89 (H) 09/08/2017 0923   AST 17 01/06/2018 1337   AST 21 11/10/2017 0804   ALT 11 01/06/2018 1337   ALT 14  11/10/2017 0804   ALT 22 09/08/2017 0923   BILITOT 0.3 01/06/2018 1337   BILITOT 0.4 11/10/2017 0804      Impression and Plan: [name] is a very pleasant 62 yo caucasian female with iron deficiency anemia secondary to ulcerative colitis (intermittent GI blood loss/malabsorption).  She is symptomatic with fatigue and occasional palpitations.  We will see what her iron studies show and bring her back in for infusion if needed.  We will go ahead and plan to see her back in another 8 weeks.  Greater than 50% of her 15 minute face to face visit was  spent counseling and coordinating care.  She will contact our office with any questions or concerns. We can certainly see her sooner if need be.   Laverna Peace, NP 5/9/20198:53 AM

## 2018-01-18 ENCOUNTER — Encounter: Payer: Self-pay | Admitting: Gynecology

## 2018-01-18 DIAGNOSIS — Z1231 Encounter for screening mammogram for malignant neoplasm of breast: Secondary | ICD-10-CM | POA: Diagnosis not present

## 2018-01-20 DIAGNOSIS — H2513 Age-related nuclear cataract, bilateral: Secondary | ICD-10-CM | POA: Diagnosis not present

## 2018-01-20 DIAGNOSIS — H524 Presbyopia: Secondary | ICD-10-CM | POA: Diagnosis not present

## 2018-01-20 DIAGNOSIS — H5203 Hypermetropia, bilateral: Secondary | ICD-10-CM | POA: Diagnosis not present

## 2018-01-23 ENCOUNTER — Inpatient Hospital Stay: Payer: Medicare Other

## 2018-01-23 VITALS — BP 144/83 | HR 84 | Temp 98.2°F | Resp 20

## 2018-01-23 DIAGNOSIS — D500 Iron deficiency anemia secondary to blood loss (chronic): Secondary | ICD-10-CM | POA: Diagnosis not present

## 2018-01-23 DIAGNOSIS — D6862 Lupus anticoagulant syndrome: Secondary | ICD-10-CM | POA: Diagnosis not present

## 2018-01-23 DIAGNOSIS — N390 Urinary tract infection, site not specified: Secondary | ICD-10-CM | POA: Diagnosis not present

## 2018-01-23 DIAGNOSIS — K51911 Ulcerative colitis, unspecified with rectal bleeding: Secondary | ICD-10-CM | POA: Diagnosis not present

## 2018-01-23 MED ORDER — SODIUM CHLORIDE 0.9 % IV SOLN
INTRAVENOUS | Status: DC
  Administered 2018-01-23: 11:00:00 via INTRAVENOUS

## 2018-01-23 MED ORDER — SODIUM CHLORIDE 0.9 % IV SOLN
510.0000 mg | Freq: Once | INTRAVENOUS | Status: AC
  Administered 2018-01-23: 510 mg via INTRAVENOUS
  Filled 2018-01-23: qty 17

## 2018-01-23 NOTE — Patient Instructions (Signed)

## 2018-02-02 DIAGNOSIS — K5100 Ulcerative (chronic) pancolitis without complications: Secondary | ICD-10-CM | POA: Diagnosis not present

## 2018-02-08 DIAGNOSIS — I129 Hypertensive chronic kidney disease with stage 1 through stage 4 chronic kidney disease, or unspecified chronic kidney disease: Secondary | ICD-10-CM | POA: Diagnosis not present

## 2018-02-08 DIAGNOSIS — D631 Anemia in chronic kidney disease: Secondary | ICD-10-CM | POA: Diagnosis not present

## 2018-02-08 DIAGNOSIS — R768 Other specified abnormal immunological findings in serum: Secondary | ICD-10-CM | POA: Diagnosis not present

## 2018-02-08 DIAGNOSIS — N183 Chronic kidney disease, stage 3 (moderate): Secondary | ICD-10-CM | POA: Diagnosis not present

## 2018-02-08 DIAGNOSIS — N179 Acute kidney failure, unspecified: Secondary | ICD-10-CM | POA: Diagnosis not present

## 2018-02-08 DIAGNOSIS — D509 Iron deficiency anemia, unspecified: Secondary | ICD-10-CM | POA: Diagnosis not present

## 2018-02-28 ENCOUNTER — Inpatient Hospital Stay: Payer: Medicare Other | Attending: Hematology & Oncology

## 2018-02-28 ENCOUNTER — Other Ambulatory Visit: Payer: Self-pay

## 2018-02-28 VITALS — BP 139/87 | HR 78 | Temp 98.2°F | Resp 18

## 2018-02-28 DIAGNOSIS — D500 Iron deficiency anemia secondary to blood loss (chronic): Secondary | ICD-10-CM

## 2018-02-28 DIAGNOSIS — K51911 Ulcerative colitis, unspecified with rectal bleeding: Secondary | ICD-10-CM | POA: Insufficient documentation

## 2018-02-28 MED ORDER — SODIUM CHLORIDE 0.9 % IV SOLN
510.0000 mg | Freq: Once | INTRAVENOUS | Status: AC
  Administered 2018-02-28: 510 mg via INTRAVENOUS
  Filled 2018-02-28: qty 17

## 2018-02-28 NOTE — Patient Instructions (Signed)

## 2018-03-02 DIAGNOSIS — K5100 Ulcerative (chronic) pancolitis without complications: Secondary | ICD-10-CM | POA: Diagnosis not present

## 2018-03-10 ENCOUNTER — Ambulatory Visit: Payer: Medicare Other | Admitting: Gastroenterology

## 2018-03-11 ENCOUNTER — Other Ambulatory Visit: Payer: Self-pay | Admitting: Family Medicine

## 2018-03-11 DIAGNOSIS — M109 Gout, unspecified: Secondary | ICD-10-CM

## 2018-03-11 DIAGNOSIS — F32A Depression, unspecified: Secondary | ICD-10-CM

## 2018-03-11 DIAGNOSIS — F329 Major depressive disorder, single episode, unspecified: Secondary | ICD-10-CM

## 2018-03-16 ENCOUNTER — Inpatient Hospital Stay: Payer: Medicare Other | Attending: Hematology & Oncology | Admitting: Family

## 2018-03-16 ENCOUNTER — Other Ambulatory Visit: Payer: Self-pay

## 2018-03-16 ENCOUNTER — Inpatient Hospital Stay: Payer: Medicare Other

## 2018-03-16 VITALS — BP 159/99 | HR 86 | Temp 98.3°F | Resp 18 | Wt 187.0 lb

## 2018-03-16 DIAGNOSIS — K51918 Ulcerative colitis, unspecified with other complication: Secondary | ICD-10-CM | POA: Insufficient documentation

## 2018-03-16 DIAGNOSIS — K909 Intestinal malabsorption, unspecified: Secondary | ICD-10-CM | POA: Diagnosis not present

## 2018-03-16 DIAGNOSIS — D6862 Lupus anticoagulant syndrome: Secondary | ICD-10-CM | POA: Diagnosis not present

## 2018-03-16 DIAGNOSIS — D500 Iron deficiency anemia secondary to blood loss (chronic): Secondary | ICD-10-CM | POA: Insufficient documentation

## 2018-03-16 LAB — IRON AND TIBC
Iron: 65 ug/dL (ref 41–142)
Saturation Ratios: 21 % (ref 21–57)
TIBC: 313 ug/dL (ref 236–444)
UIBC: 248 ug/dL

## 2018-03-16 LAB — CBC WITH DIFFERENTIAL (CANCER CENTER ONLY)
Basophils Absolute: 0 10*3/uL (ref 0.0–0.1)
Basophils Relative: 0 %
Eosinophils Absolute: 0.3 10*3/uL (ref 0.0–0.5)
Eosinophils Relative: 4 %
HCT: 32.6 % — ABNORMAL LOW (ref 34.8–46.6)
Hemoglobin: 10.6 g/dL — ABNORMAL LOW (ref 11.6–15.9)
Lymphocytes Relative: 29 %
Lymphs Abs: 2.5 10*3/uL (ref 0.9–3.3)
MCH: 32.9 pg (ref 26.0–34.0)
MCHC: 32.5 g/dL (ref 32.0–36.0)
MCV: 101.2 fL — ABNORMAL HIGH (ref 81.0–101.0)
Monocytes Absolute: 1 10*3/uL — ABNORMAL HIGH (ref 0.1–0.9)
Monocytes Relative: 11 %
Neutro Abs: 5.1 10*3/uL (ref 1.5–6.5)
Neutrophils Relative %: 56 %
Platelet Count: 313 10*3/uL (ref 145–400)
RBC: 3.22 MIL/uL — ABNORMAL LOW (ref 3.70–5.32)
RDW: 14.5 % (ref 11.1–15.7)
WBC Count: 8.9 10*3/uL (ref 3.9–10.0)

## 2018-03-16 LAB — CMP (CANCER CENTER ONLY)
ALT: 17 U/L (ref 0–44)
AST: 19 U/L (ref 15–41)
Albumin: 3.8 g/dL (ref 3.5–5.0)
Alkaline Phosphatase: 82 U/L (ref 38–126)
Anion gap: 11 (ref 5–15)
BUN: 34 mg/dL — ABNORMAL HIGH (ref 8–23)
CO2: 25 mmol/L (ref 22–32)
Calcium: 10.2 mg/dL (ref 8.9–10.3)
Chloride: 106 mmol/L (ref 98–111)
Creatinine: 2.16 mg/dL — ABNORMAL HIGH (ref 0.44–1.00)
GFR, Est AFR Am: 27 mL/min — ABNORMAL LOW (ref >60)
GFR, Estimated: 23 mL/min — ABNORMAL LOW (ref >60)
Glucose, Bld: 100 mg/dL — ABNORMAL HIGH (ref 70–99)
Potassium: 3.5 mmol/L (ref 3.5–5.1)
Sodium: 142 mmol/L (ref 135–145)
Total Bilirubin: 0.2 mg/dL — ABNORMAL LOW (ref 0.3–1.2)
Total Protein: 8 g/dL (ref 6.5–8.1)

## 2018-03-16 LAB — FERRITIN: Ferritin: 437 ng/mL — ABNORMAL HIGH (ref 11–307)

## 2018-03-16 LAB — RETICULOCYTES
RBC.: 3.25 MIL/uL — ABNORMAL LOW (ref 3.70–5.45)
Retic Count, Absolute: 87.8 10*3/uL (ref 33.7–90.7)
Retic Ct Pct: 2.7 % — ABNORMAL HIGH (ref 0.7–2.1)

## 2018-03-16 NOTE — Progress Notes (Signed)
Hematology and Oncology Follow Up Visit  [name] [id number] 1956/03/25 62 y.o. 03/16/2018   Principle Diagnosis:  Recurrent iron deficiency anemia secondary to ulcerative colitis Right cephalic/basilic vein thrombus - transiently positive lupus anticoagulant  Current Therapy:   IV iron as indicated - last dose received in May/June x 2   Interim History:  [name] is here today for follow-up. She is still symptomatic with fatigue, palpitations and chills.  She has noted some bright red blood in her stool at times when she strains. No other episodes of bleeding. No bruising or petechiae.  She states that she is doing well on Entyvio. She has occasional episodes of abdominal pain. This comes and goes.  No fever, n/v, cough, rash, dizziness, SOB, chest pain or changes in bowel or bladder habits.  No swelling or tenderness in her extremities at this time. She has numbness and tingling in her feet that waxes and wanes.  No lymphadenopathy noted on exam.  She has a good appetite and is staying well hydrated. Her weight is stable.   ECOG Performance Status: 1 - Symptomatic but completely ambulatory  Medications:  Allergies as of 03/16/2018      Reactions   Morphine Other (See Comments)   Gives me crazy dreams   Sulfa Antibiotics Hives   Sulfonamide Derivatives Itching, Swelling, Rash      Medication List        Accurate as of 03/16/18  9:19 AM. Always use your most recent med list.          AMBULATORY NON FORMULARY MEDICATION Medication Name: Nitroglycerine ointment 0.125 %  Apply a pea sized amount internally four times daily. Dispense 30 GM zero refill   BIOTIN PO Take 1 tablet by mouth daily.   DULoxetine 60 MG capsule Commonly known as:  CYMBALTA TAKE 2 CAPSULES BY MOUTH  DAILY   folic acid 1 MG tablet Commonly known as:  FOLVITE Take 2 tablets (2 mg total) by mouth daily.   GAS-X PO Take 1 tablet by mouth at bedtime.   HYDROcodone-acetaminophen 5-325 MG  tablet Commonly known as:  NORCO/VICODIN Take 2 tablets by mouth every 4 (four) hours as needed for moderate pain or severe pain.   Melatonin 5 MG Tabs Take 5 mg at bedtime by mouth.   metoprolol succinate 100 MG 24 hr tablet Commonly known as:  TOPROL-XL Take 100 mg by mouth daily.   ranitidine 150 MG tablet Commonly known as:  ZANTAC TAKE 1 TABLET BY MOUTH TWO  TIMES DAILY   sodium chloride 0.9 % SOLN 250 mL with vedolizumab 300 MG SOLR 300 mg Inject 300 mg into the vein every 30 (thirty) days.   torsemide 20 MG tablet Commonly known as:  DEMADEX Take 20 mg by mouth daily.   ULORIC 40 MG tablet Generic drug:  febuxostat TAKE 1 TABLET BY MOUTH  DAILY   vitamin B-12 100 MCG tablet Commonly known as:  CYANOCOBALAMIN Take 100 mcg by mouth daily.   Vitamin D-3 5000 units Tabs Take 1 tablet by mouth daily.   Vitamin D2 2000 units Tabs Take by mouth daily.       Allergies:  Allergies  Allergen Reactions  . Morphine Other (See Comments)    Gives me crazy dreams  . Sulfa Antibiotics Hives  . Sulfonamide Derivatives Itching, Swelling and Rash    Past Medical History, Surgical history, Social history, and Family History were reviewed and updated.  Review of Systems: All other 10 point  review of systems is negative.   Physical Exam:  weight is 187 lb (84.8 kg). Her oral temperature is 98.3 F (36.8 C). Her blood pressure is 159/99 (abnormal) and her pulse is 86. Her respiration is 18 and oxygen saturation is 100%.   Wt Readings from Last 3 Encounters:  03/16/18 187 lb (84.8 kg)  01/12/18 185 lb (83.9 kg)  01/11/18 186 lb (84.4 kg)    Ocular: Sclerae unicteric, pupils equal, round and reactive to light Ear-nose-throat: Oropharynx clear, dentition fair Lymphatic: No cervical, supraclavicular or axillary adenopathy Lungs no rales or rhonchi, good excursion bilaterally Heart regular rate and rhythm, no murmur appreciated Abd soft, nontender, positive bowel  sounds, no liver or spleen tip palpated on exam, no fluid wave  MSK no focal spinal tenderness, no joint edema Neuro: non-focal, well-oriented, appropriate affect Breasts: Deferred   Lab Results  Component Value Date   WBC 8.9 03/16/2018   HGB 10.6 (L) 03/16/2018   HCT 32.6 (L) 03/16/2018   MCV 101.2 (H) 03/16/2018   PLT 313 03/16/2018   Lab Results  Component Value Date   FERRITIN 64 01/12/2018   IRON 59 01/12/2018   TIBC 353 01/12/2018   UIBC 294 01/12/2018   IRONPCTSAT 17 (L) 01/12/2018   Lab Results  Component Value Date   RETICCTPCT 2.1 01/12/2018   RBC 3.22 (L) 03/16/2018   No results found for: KPAFRELGTCHN, LAMBDASER, KAPLAMBRATIO No results found for: IGGSERUM, IGA, IGMSERUM No results found for: Odetta Pink, SPEI   Chemistry      Component Value Date/Time   NA 139 01/12/2018 0827   NA 142 09/08/2017 0923   K 4.0 01/12/2018 0827   K 3.7 09/08/2017 0923   CL 103 01/12/2018 0827   CL 97 (L) 09/08/2017 0923   CO2 27 01/12/2018 0827   CO2 28 09/08/2017 0923   BUN 33 (H) 01/12/2018 0827   BUN 49 (H) 09/08/2017 0923   CREATININE 2.36 (H) 01/12/2018 0827   CREATININE 3.1 (HH) 09/08/2017 0923      Component Value Date/Time   CALCIUM 10.3 01/12/2018 0827   CALCIUM 9.9 09/08/2017 0923   CALCIUM 8.9 04/12/2013 0851   ALKPHOS 72 01/12/2018 0827   ALKPHOS 89 (H) 09/08/2017 0923   AST 20 01/12/2018 0827   ALT 15 01/12/2018 0827   ALT 22 09/08/2017 0923   BILITOT 0.3 01/12/2018 0827      Impression and Plan: [name] is a very pleasant 62 yo caucasian female with iron deficiency anemia secondary to intermittent GI blood loss/malabsorption with UC. Hgb is 10.6 and she is symptomatic as mentioned above.  We will see what her iron studies show and bring her back in for infusion if needed.  We will plan to see her back in another 8 weeks for follow-up.  She will contact our office with any questions or concerns.  We can certainly see her sooner if need be.   Laverna Peace, NP 7/11/20199:19 AM

## 2018-03-30 DIAGNOSIS — K5100 Ulcerative (chronic) pancolitis without complications: Secondary | ICD-10-CM | POA: Diagnosis not present

## 2018-04-06 ENCOUNTER — Other Ambulatory Visit: Payer: Self-pay

## 2018-04-12 ENCOUNTER — Other Ambulatory Visit: Payer: Self-pay | Admitting: Family Medicine

## 2018-04-12 ENCOUNTER — Ambulatory Visit (INDEPENDENT_AMBULATORY_CARE_PROVIDER_SITE_OTHER): Payer: Medicare HMO | Admitting: Family Medicine

## 2018-04-12 ENCOUNTER — Telehealth: Payer: Self-pay | Admitting: Family Medicine

## 2018-04-12 ENCOUNTER — Encounter: Payer: Self-pay | Admitting: Family Medicine

## 2018-04-12 VITALS — BP 128/88 | HR 93 | Temp 97.9°F | Ht 63.0 in | Wt 189.5 lb

## 2018-04-12 DIAGNOSIS — M10071 Idiopathic gout, right ankle and foot: Secondary | ICD-10-CM

## 2018-04-12 DIAGNOSIS — M79644 Pain in right finger(s): Secondary | ICD-10-CM | POA: Diagnosis not present

## 2018-04-12 DIAGNOSIS — M7050 Other bursitis of knee, unspecified knee: Secondary | ICD-10-CM | POA: Diagnosis not present

## 2018-04-12 HISTORY — DX: Pain in right finger(s): M79.644

## 2018-04-12 MED ORDER — HYDROCODONE-ACETAMINOPHEN 5-325 MG PO TABS: 2.0000 | ORAL_TABLET | ORAL | 0 refills | Status: DC | PRN

## 2018-04-12 MED ORDER — PREDNISONE 20 MG PO TABS: 40.0000 mg | ORAL_TABLET | Freq: Every day | ORAL | 0 refills | Status: AC

## 2018-04-12 NOTE — Telephone Encounter (Signed)
I sent in 1 month but need database Contract?  UDs?

## 2018-04-12 NOTE — Progress Notes (Signed)
Musculoskeletal Exam  Patient: [name] DOB: [birth year]  DOS: 04/12/2018  SUBJECTIVE:  Chief Complaint:   Chief Complaint  Patient presents with  . Knee Pain    right    [name] is a 62 y.o.  female for evaluation and treatment of R knee pain.   Onset:  3 weeks ago. No inj or change in activitiy.  Location:  Character:  aching and throbbing  Progression of issue:  is unchanged Associated symptoms: difficult to walk in AM, unstable Treatment: to date has been ice.   Neurovascular symptoms: no  R thumb pain that catches and causes pain in AM. Feels a popping every time that she moves it. No inj or change in activity. Going on for a couple years. No current pain or swelling.   ROS: Musculoskeletal/Extremities: +knee/thumb pain Neuro: No new numbness or tingling  Past Medical History:  Diagnosis Date  . Abdominal pain    Hx  . Allergy   . Anal stenosis   . Anemia   . Anxiety   . Arthritis   . Asthma    patient does not have inhaler  . Blood in stool    Hx  . Blood in urine    Hx  . Blood transfusion without reported diagnosis   . De Quervain's tenosynovitis   . Depression   . Difficulty urinating    Hx  . Easy bruising   . Esophagitis   . Fainting    History - resolved - due to dehydration  . Fatigue    Hx  . Fibroid    Hx  . Gastric polyp   . GERD (gastroesophageal reflux disease)   . 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 Hemorrhoids, external   . Hemorrhoids, internal   . Herpes, genital    vaginal treated 07/05/12 and pt states is resolved  . History of cervical dysplasia   . History of small bowel obstruction   . Hyperlipidemia    currently no meds  . Hyperparathyroidism   . Hypertension   . Hypokalemia    Hx  . 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 Obesity   . Ovarian cyst   . Poor dental hygiene   . Pulmonary  nodule, right    right upper lobe  . Rectal bleeding    Hx  . Rectal pain    Hx  . Renal insufficiency    CKD - stage 2  . RLS (restless legs syndrome)    no meds  . Tooth infection 11/2016   right low  . Ulcerative colitis   . Visual disturbance    wears glasses  . Weakness generalized    Hx - patient denies generalized weakness  . Wears dentures    upper only    Objective: VITAL SIGNS: BP 128/88 (BP Location: Left Arm, Patient Position: Sitting, Cuff Size: Normal)   Pulse 93   Temp 97.9 F (36.6 C) (Oral)   Ht 5' 3"  (1.6 m)   Wt 189 lb 8 oz (86 kg)   SpO2 97%   BMI 33.57 kg/m  Constitutional: Well formed, well developed. No acute distress. Cardiovascular: Brisk cap refill Thorax & Lungs: No accessory muscle use Musculoskeletal: R knee.   Normal active range of motion: yes.   Normal passive range of motion: yes Tenderness to palpation: +  ttp over pes anserine bursa Deformity: no Ecchymosis: no Tests positive:None Tests negative: Stine's, patellar app/grind, Lachman's, McMurray's Thumb- no edema or ttp, no erythema or excessive warmth; I do feel a catch or pop during IP extension Neurologic: Normal sensory function. No focal deficits noted.  Psychiatric: Normal mood. Age appropriate judgment and insight. Alert & oriented x 3.    Assessment:  Pes anserine bursitis - Plan: predniSONE (DELTASONE) 20 MG tablet  Pain of right thumb  Plan: Ice, activity as tolerated, pred. Injection if no improvement. Ice, pred for thumb. Offered referral to hand, pt declined at this time. F/u 4 weeks for knee pain with Dr. [name] for possible injection if no better. The patient voiced understanding and agreement to the plan.   [place], DO 04/12/18  8:31 AM

## 2018-04-12 NOTE — Patient Instructions (Addendum)
Ice/cold pack over area for 10-15 min twice daily.  OK to take Tylenol 1000 mg (2 extra strength tabs) or 975 mg (3 regular strength tabs) every 6 hours as needed.  Activity as tolerated.    For your thumb, I recommend ice in the AM. If you ever want to see a hand surgeon, let me know.   Let us know if you need anything.

## 2018-04-12 NOTE — Telephone Encounter (Signed)
Pt came in for an OV with Dr. Nani Ravens about her knee. Pt asked that I send a message requesting a refill on her hydrocondone. Pt would like a call when/if it is filled.   CB [phone]

## 2018-04-12 NOTE — Progress Notes (Signed)
Pre visit review using our clinic review tool, if applicable. No additional management support is needed unless otherwise documented below in the visit note. 

## 2018-04-13 ENCOUNTER — Other Ambulatory Visit: Payer: Self-pay | Admitting: *Deleted

## 2018-04-13 DIAGNOSIS — Z79899 Other long term (current) drug therapy: Secondary | ICD-10-CM

## 2018-04-13 DIAGNOSIS — M10071 Idiopathic gout, right ankle and foot: Secondary | ICD-10-CM

## 2018-04-13 NOTE — Telephone Encounter (Signed)
Patient will be in on Monday for UDS and to sign contract.

## 2018-04-13 NOTE — Telephone Encounter (Signed)
Called pt and informed her that a 1 month supply was sent in but she would need to come in and do a UDS contract. Pt stated that she would come in Monday morning.

## 2018-04-17 ENCOUNTER — Other Ambulatory Visit: Payer: Medicare HMO

## 2018-04-24 ENCOUNTER — Telehealth: Payer: Self-pay

## 2018-04-24 NOTE — Telephone Encounter (Signed)
Received request from [facility] and they asked to have latest office notes faxed to them at [phone]. Patient receives at home Entyvio infusions.

## 2018-04-26 DIAGNOSIS — K5100 Ulcerative (chronic) pancolitis without complications: Secondary | ICD-10-CM | POA: Diagnosis not present

## 2018-05-02 ENCOUNTER — Encounter: Payer: Self-pay | Admitting: *Deleted

## 2018-05-02 ENCOUNTER — Other Ambulatory Visit: Payer: Self-pay | Admitting: *Deleted

## 2018-05-02 NOTE — Patient Outreach (Signed)
[facility]) Care Management  05/02/2018  Yukie Bergeron 12/29/1955 [id number]  Referral via EMMI-Prevent Program with patient engagement tool-score 8:  Telephone call to patient who was advised of [facility] care management services.   Patient advised that she was not available to talk now but asked to call at another time.  Plan: Send outreach letter Follow up call.  Sherrin Daisy, RN BSN [place] Management Coordinator [facility] Care Management  [phone]

## 2018-05-03 ENCOUNTER — Ambulatory Visit: Payer: Self-pay | Admitting: *Deleted

## 2018-05-04 ENCOUNTER — Other Ambulatory Visit: Payer: Self-pay | Admitting: *Deleted

## 2018-05-04 NOTE — Patient Outreach (Signed)
[facility]) Care Management  05/04/2018  Tayja Manzer 04/20/1956 [id number]  Referral via EMMI-Prevent Program with patient engagement tool-score 8:  Telephone call # 2 to patient who was advised of [place] Management services.  [place] verification received.  Patient voices that she does not need case management services at this time. States she manages her own medications and takes as prescribed by her doctors. Voices that she has not had any trouble getting her prescriptions filled.   States she takes herself to MD appointments. Voices that she has seen per primary care provider and other specialists recently. States she is managing her health and consulting with her doctors.   Patient declined [facility] services at this time, but will accept contact information for [facility].Sherrin Daisy, RN BSN [place] Management Coordinator [facility] Care Management  [phone]

## 2018-05-05 ENCOUNTER — Encounter: Payer: Self-pay | Admitting: *Deleted

## 2018-05-12 ENCOUNTER — Ambulatory Visit: Payer: Medicare Other | Admitting: Gastroenterology

## 2018-05-12 ENCOUNTER — Encounter: Payer: Self-pay | Admitting: Gastroenterology

## 2018-05-12 VITALS — BP 126/82 | HR 100 | Ht 63.25 in | Wt 188.4 lb

## 2018-05-12 DIAGNOSIS — K50019 Crohn's disease of small intestine with unspecified complications: Secondary | ICD-10-CM | POA: Diagnosis not present

## 2018-05-12 DIAGNOSIS — K91850 Pouchitis: Secondary | ICD-10-CM

## 2018-05-12 NOTE — Patient Instructions (Signed)
If you are age 64 or older, your body mass index should be between 23-30. Your Body mass index is 33.11 kg/m. If this is out of the aforementioned range listed, please consider follow up with your Primary Care Provider.  If you are age 4 or younger, your body mass index should be between 19-25. Your Body mass index is 33.11 kg/m. If this is out of the aformentioned range listed, please consider follow up with your Primary Care Provider.   Yur provider has requested that you go to the basement level for lab work in four months. Press "B" on the elevator. The lab is located at the first door on the left as you exit the elevator. September 11, 2018  Continue Entyvio.  You have been given samples of Xifaxan 550 mg - take one tablet twice daily. Call back to let us know if this works.  Follow up with me in six months.  Please call the office for an appointment as the schedule is not available at this time.  Thank you for choosing me and [facility].   Litchfield Cellar, MD

## 2018-05-12 NOTE — Progress Notes (Signed)
HPI :  IBD history: 62 year old white female with IBD ( Crohn's) since 1. 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 She was never able to be put in remission prior to colectomy. She was only exposed to steroids prior to her surgery. Post-operatively, she was on Remicade for 3-4 years but she is not sure if it helped too much. She has been on 6MP remotely but did not work for her. She was on Humira but also did not put her into remission and she developed high antibody titers and it was stopped. Most recentlyon Entyvio and low dose 6MP. 6MP was stopped due to worsening anemia.  She reports multiple courses of antibiotics with cipro and flagyl with some benefit historically. She has been on budesonider with ? Relief. Father and daughter have UC. She has seen Jeanne Ivan at [facility] previously for second opinion.  SINCE THE LAST VISIT  62 year old female here for follow-up visit today for her Crohn's disease. She is receiving Entyvio every 4 weeks. She continues to state this has significantly helped her from compared to when she was not on it. She states she has some good days and bad days, but overall has been stable. She has about 6 bowel movements per day, she usually has bowel movement after she eats. She isn't very occasional rectal bleeding. She was previously on 6MP but this is been held due to anemia and we were not certain if it was related at the time. She has had intermittent courses of antibiotics for component of pouchitis as well, in May she was given a course of Cipro and tinidazole which did provide some benefit for her. She is asking about chronic antibiotics for treatment of pouchitis as they do seem to help her. She thinks she is gaining weight, round 180 pounds now. No significant abdominal pains which bother her. Generally she is feeling well.  Pouchoscopy 03/31/17 - anal fissure, mild anal canal stenosis, focal ulceration of pouch and more proximal  ileum but improved compared to previous.     Past Medical History:  Diagnosis Date  . Abdominal pain    Hx  . Allergy   . Anal stenosis   . Anemia   . Anxiety   . Arthritis   . Asthma    patient does not have inhaler  . Blood in stool    Hx  . Blood in urine    Hx  . Blood transfusion without reported diagnosis   . De Quervain's tenosynovitis   . Depression   . Difficulty urinating    Hx  . Easy bruising   . Esophagitis   . Fainting    History - resolved - due to dehydration  . Fatigue    Hx  . Fibroid    Hx  . Gastric polyp   . GERD (gastroesophageal reflux disease)   . 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 Hemorrhoids, external   . Hemorrhoids, internal   . Herpes, genital    vaginal treated 07/05/12 and pt states is resolved  . History of cervical dysplasia   . History of small bowel obstruction   . Hyperlipidemia    currently no meds  . Hyperparathyroidism   . Hypertension   . Hypokalemia    Hx  . 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  Obesity   . Ovarian cyst   . Poor dental hygiene   . Pulmonary nodule, right    right upper lobe  . Rectal bleeding    Hx  . Rectal pain    Hx  . Renal insufficiency    CKD - stage 2  . RLS (restless legs syndrome)    no meds  . Tooth infection 11/2016   right low  . Ulcerative colitis   . Visual disturbance    wears glasses  . Weakness generalized    Hx - patient denies generalized weakness  . Wears dentures    upper only     Past Surgical History:  Procedure Laterality Date  . ANAL DILATION    . CERVICAL BIOPSY  W/ LOOP ELECTRODE EXCISION    . CHOLECYSTECTOMY    . COLONOSCOPY     [name]  . fatty tumor removed from back     X 2  . HEMORRHOID SURGERY    . ILEOSTOMY CLOSURE    . RESTORATIVE PROCTOCOLECTOMY     with insertion of ileoanal J Pouch with loop ileostomy  . SIGMOIDOSCOPY    .  TOTAL ABDOMINAL HYSTERECTOMY  1998   TAH/LSO  . TUBAL LIGATION    . UPPER GASTROINTESTINAL ENDOSCOPY     [name]   Family History  Problem Relation Age of Onset  . Ulcerative colitis Father   . Hypertension Father   . Heart attack Father   . Hypertension Mother   . Heart disease Mother        s/p pci  . Ulcerative colitis Daughter   . Irritable bowel syndrome Unknown        grandchildren  . Diabetes Sister   . Cancer Sister        uterine  . Cancer Maternal Uncle        LUNG  . Colon cancer Neg Hx   . Esophageal cancer Neg Hx   . Stomach cancer Neg Hx   . Rectal cancer Neg Hx    Social History   Tobacco Use  . Smoking status: Former Smoker    Packs/day: 1.00    Years: 4.00    Pack years: 4.00    Types: Cigarettes    Last attempt to quit: 05/21/1979    Years since quitting: 39.0  . Smokeless tobacco: Never Used  Substance Use Topics  . Alcohol use: No    Alcohol/week: 0.0 standard drinks  . Drug use: No   Current Outpatient Medications  Medication Sig Dispense Refill  . AMBULATORY NON FORMULARY MEDICATION Medication Name: Nitroglycerine ointment 0.125 %  Apply a pea sized amount internally four times daily. Dispense 30 GM zero refill (Patient taking differently: Medication Name: Nitroglycerine ointment 0.125 %  Apply a pea sized amount internally four times daily-PRN Dispense 30 GM zero refill) 30 g 3  . BIOTIN PO Take 1 tablet by mouth daily.     . Cholecalciferol (VITAMIN D-3) 5000 units TABS Take 1 tablet by mouth daily.    . DULoxetine (CYMBALTA) 60 MG capsule TAKE 2 CAPSULES BY MOUTH  DAILY 180 capsule 0  . Ergocalciferol (VITAMIN D2) 2000 units TABS Take by mouth daily.    . folic acid (FOLVITE) 1 MG tablet Take 2 tablets (2 mg total) by mouth daily. 180 tablet 3  . hydrALAZINE (APRESOLINE) 10 MG tablet Take 10 mg by mouth 3 (three) times daily.    Marland Kitchen HYDROcodone-acetaminophen (NORCO/VICODIN) 5-325 MG tablet Take 2 tablets by mouth every 4 (four)  hours as  needed for moderate pain or severe pain. 90 tablet 0  . Melatonin 5 MG TABS Take 5 mg at bedtime by mouth.    . metoprolol succinate (TOPROL-XL) 100 MG 24 hr tablet Take 100 mg by mouth daily.     . ranitidine (ZANTAC) 150 MG tablet TAKE 1 TABLET BY MOUTH TWO  TIMES DAILY 180 tablet 0  . Simethicone (GAS-X PO) Take 1 tablet by mouth at bedtime.     . sodium chloride 0.9 % SOLN 250 mL with vedolizumab 300 MG SOLR 300 mg Inject 300 mg into the vein every 30 (thirty) days. 300 mg 12  . ULORIC 40 MG tablet TAKE 1 TABLET BY MOUTH  DAILY 90 tablet 0  . vitamin B-12 (CYANOCOBALAMIN) 100 MCG tablet Take 100 mcg by mouth daily.      No current facility-administered medications for this visit.    Allergies  Allergen Reactions  . Morphine Other (See Comments)    Gives me crazy dreams  . Sulfa Antibiotics Hives  . Sulfonamide Derivatives Itching, Swelling and Rash     Review of Systems: All systems reviewed and negative except where noted in HPI.   Lab Results  Component Value Date   WBC 8.9 03/16/2018   HGB 10.6 (L) 03/16/2018   HCT 32.6 (L) 03/16/2018   MCV 101.2 (H) 03/16/2018   PLT 313 03/16/2018    Lab Results  Component Value Date   CREATININE 2.16 (H) 03/16/2018   BUN 34 (H) 03/16/2018   NA 142 03/16/2018   K 3.5 03/16/2018   CL 106 03/16/2018   CO2 25 03/16/2018    Lab Results  Component Value Date   ALT 17 03/16/2018   AST 19 03/16/2018   ALKPHOS 82 03/16/2018   BILITOT 0.2 (L) 03/16/2018   Lab Results  Component Value Date   IRON 65 03/16/2018   TIBC 313 03/16/2018   FERRITIN 437 (H) 03/16/2018     Physical Exam: BP 126/82 (BP Location: Left Arm, Patient Position: Sitting, Cuff Size: Normal)   Pulse 100   Ht 5' 3.25" (1.607 m)   Wt 188 lb 6 oz (85.4 kg)   BMI 33.11 kg/m  Constitutional: Pleasant, female in no acute distress. HEENT: Normocephalic and atraumatic. Conjunctivae are normal. No scleral icterus. Neck supple.  Cardiovascular: Normal rate,  regular rhythm.  Pulmonary/chest: Effort normal and breath sounds normal. No wheezing, rales or rhonchi. Abdominal: Soft, nondistended, nontender. . There are no masses palpable. No hepatomegaly. Extremities: no edema Lymphadenopathy: No cervical adenopathy noted. Neurological: Alert and oriented to person place and time. Skin: Skin is warm and dry. No rashes noted. Psychiatric: Normal mood and affect. Behavior is normal.   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 She is on higher dose Entyvio given prior level of 4.8 without antibodies and pouchoscopy at the time showing mild inflammation. Higher-dose Entyvio has provided benefit. She continues to have periodic mild flares of symptoms, she attributes this to pouchitis, she does respond to antibiotics for this historically. She is asking for another course of antibiotics at this time, I discussed options with her. I do have free samples of rifaximin and was able to give her a two-week course to see if this provides some benefit for her, although concerned about  cost regarding long-term use for this. She asks about Cipro, would want to avoid this long-term due to risk of tendon rupture. Could consider tinidazole pending her course. We otherwise discussed timing of her next pouchoscopy. She was to continue the course for now, we'll consider this at her next follow-up. I will see her again in 6 months. She is due for labs in a few months. She should receive a flu shot this fall. She agreed with the plan.   Sewaren Cellar, MD [facility]

## 2018-05-15 ENCOUNTER — Ambulatory Visit (INDEPENDENT_AMBULATORY_CARE_PROVIDER_SITE_OTHER): Payer: Medicare HMO | Admitting: Family Medicine

## 2018-05-15 ENCOUNTER — Other Ambulatory Visit: Payer: Self-pay | Admitting: Family Medicine

## 2018-05-15 ENCOUNTER — Encounter: Payer: Self-pay | Admitting: Family Medicine

## 2018-05-15 VITALS — BP 133/87 | HR 91 | Temp 98.3°F | Resp 16 | Ht 63.0 in | Wt 190.2 lb

## 2018-05-15 DIAGNOSIS — G8929 Other chronic pain: Secondary | ICD-10-CM

## 2018-05-15 DIAGNOSIS — M7050 Other bursitis of knee, unspecified knee: Secondary | ICD-10-CM | POA: Diagnosis not present

## 2018-05-15 DIAGNOSIS — Z79899 Other long term (current) drug therapy: Secondary | ICD-10-CM | POA: Diagnosis not present

## 2018-05-15 DIAGNOSIS — M25561 Pain in right knee: Secondary | ICD-10-CM

## 2018-05-15 DIAGNOSIS — M109 Gout, unspecified: Secondary | ICD-10-CM

## 2018-05-15 DIAGNOSIS — F32A Depression, unspecified: Secondary | ICD-10-CM

## 2018-05-15 DIAGNOSIS — F329 Major depressive disorder, single episode, unspecified: Secondary | ICD-10-CM

## 2018-05-15 NOTE — Progress Notes (Signed)
Patient ID: [name], female    DOB: Mar 12, 1956  Age: 62 y.o. MRN: [id number]    Subjective:  Subjective  HPI [name] presents for f/u R knee pain--- knee is doing much better .  She does not need an injection or referral at this time She also needs to leave a uds today.   Review of Systems  Constitutional: Negative for activity change, appetite change, fatigue and unexpected weight change.  Respiratory: Negative for cough and shortness of breath.   Cardiovascular: Negative for chest pain and palpitations.  Psychiatric/Behavioral: Negative for behavioral problems and dysphoric mood. The patient is not nervous/anxious.     History Past Medical History:  Diagnosis Date  . Abdominal pain    Hx  . Allergy   . Anal stenosis   . Anemia   . Anxiety   . Arthritis   . Asthma    patient does not have inhaler  . Blood in stool    Hx  . Blood in urine    Hx  . Blood transfusion without reported diagnosis   . De Quervain's tenosynovitis   . Depression   . Difficulty urinating    Hx  . Easy bruising   . Esophagitis   . Fainting    History - resolved - due to dehydration  . Fatigue    Hx  . Fibroid    Hx  . Gastric polyp   . GERD (gastroesophageal reflux disease)   . 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 Hemorrhoids, external   . Hemorrhoids, internal   . Herpes, genital    vaginal treated 07/05/12 and pt states is resolved  . History of cervical dysplasia   . History of small bowel obstruction   . Hyperlipidemia    currently no meds  . Hyperparathyroidism   . Hypertension   . Hypokalemia    Hx  . 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 Obesity   . Ovarian cyst   . Poor dental hygiene   . Pulmonary nodule, right    right upper lobe  . Rectal bleeding    Hx  . Rectal pain    Hx  . Renal insufficiency    CKD - stage 2  . RLS (restless legs  syndrome)    no meds  . Tooth infection 11/2016   right low  . Ulcerative colitis   . Visual disturbance    wears glasses  . Weakness generalized    Hx - patient denies generalized weakness  . Wears dentures    upper only    She has a past surgical history that includes Anal dilation; Restorative proctocolectomy; Ileostomy closure; Cholecystectomy; Tubal ligation; Hemorrhoid surgery; fatty tumor removed from back; Total abdominal hysterectomy (1998); Cervical biopsy w/ loop electrode excision; Upper gastrointestinal endoscopy; Colonoscopy; and Sigmoidoscopy.   Her family history includes Cancer in her maternal uncle and sister; Diabetes in her sister; Heart attack in her father; Heart disease in her mother; Hypertension in her father and mother; Irritable bowel syndrome in her unknown relative; Ulcerative colitis in her daughter and father.She reports that she quit smoking about 39 years ago. Her smoking use included cigarettes. She has a 4.00 pack-year smoking history. She has never used smokeless tobacco. She reports that she does not drink alcohol or use drugs.  Current Outpatient Medications  on File Prior to Visit  Medication Sig Dispense Refill  . AMBULATORY NON FORMULARY MEDICATION Medication Name: Nitroglycerine ointment 0.125 %  Apply a pea sized amount internally four times daily. Dispense 30 GM zero refill (Patient taking differently: Medication Name: Nitroglycerine ointment 0.125 %  Apply a pea sized amount internally four times daily-PRN Dispense 30 GM zero refill) 30 g 3  . BIOTIN PO Take 1 tablet by mouth daily.     . Cholecalciferol (VITAMIN D-3) 5000 units TABS Take 1 tablet by mouth daily.    . DULoxetine (CYMBALTA) 60 MG capsule TAKE 2 CAPSULES BY MOUTH  DAILY 180 capsule 0  . Ergocalciferol (VITAMIN D2) 2000 units TABS Take by mouth daily.    . folic acid (FOLVITE) 1 MG tablet Take 2 tablets (2 mg total) by mouth daily. 180 tablet 3  . hydrALAZINE (APRESOLINE) 10 MG  tablet Take 10 mg by mouth 3 (three) times daily.    Marland Kitchen HYDROcodone-acetaminophen (NORCO/VICODIN) 5-325 MG tablet Take 2 tablets by mouth every 4 (four) hours as needed for moderate pain or severe pain. 90 tablet 0  . Melatonin 5 MG TABS Take 5 mg at bedtime by mouth.    . metoprolol succinate (TOPROL-XL) 100 MG 24 hr tablet Take 100 mg by mouth daily.     . ranitidine (ZANTAC) 150 MG tablet TAKE 1 TABLET BY MOUTH TWO  TIMES DAILY 180 tablet 0  . Simethicone (GAS-X PO) Take 1 tablet by mouth at bedtime.     . sodium chloride 0.9 % SOLN 250 mL with vedolizumab 300 MG SOLR 300 mg Inject 300 mg into the vein every 30 (thirty) days. 300 mg 12  . ULORIC 40 MG tablet TAKE 1 TABLET BY MOUTH  DAILY 90 tablet 0  . vitamin B-12 (CYANOCOBALAMIN) 100 MCG tablet Take 100 mcg by mouth daily.      No current facility-administered medications on file prior to visit.      Objective:  Objective  Physical Exam  Constitutional: She is oriented to person, place, and time. She appears well-developed and well-nourished.  HENT:  Head: Normocephalic and atraumatic.  Eyes: Conjunctivae and EOM are normal.  Neck: Normal range of motion. Neck supple. No JVD present. Carotid bruit is not present. No thyromegaly present.  Cardiovascular: Normal rate, regular rhythm and normal heart sounds.  No murmur heard. Pulmonary/Chest: Effort normal and breath sounds normal. No respiratory distress. She has no wheezes. She has no rales. She exhibits no tenderness.  Musculoskeletal: She exhibits tenderness. She exhibits no edema.       Right knee: She exhibits no swelling. Tenderness found.       Legs: Neurological: She is alert and oriented to person, place, and time.  Psychiatric: She has a normal mood and affect.  Nursing note and vitals reviewed.  BP 133/87   Pulse 91   Temp 98.3 F (36.8 C) (Oral)   Resp 16   Ht 5' 3"  (1.6 m)   Wt 190 lb 3.2 oz (86.3 kg)   SpO2 99%   BMI 33.69 kg/m  Wt Readings from Last 3  Encounters:  05/15/18 190 lb 3.2 oz (86.3 kg)  05/12/18 188 lb 6 oz (85.4 kg)  04/12/18 189 lb 8 oz (86 kg)     Lab Results  Component Value Date   WBC 8.9 03/16/2018   HGB 10.6 (L) 03/16/2018   HCT 32.6 (L) 03/16/2018   PLT 313 03/16/2018   GLUCOSE 100 (H) 03/16/2018   CHOL 247 (  H) 05/10/2017   TRIG 307.0 (H) 05/10/2017   HDL 45.70 05/10/2017   LDLDIRECT 127.0 05/10/2017   LDLCALC 122 (H) 11/22/2014   ALT 17 03/16/2018   AST 19 03/16/2018   NA 142 03/16/2018   K 3.5 03/16/2018   CL 106 03/16/2018   CREATININE 2.16 (H) 03/16/2018   BUN 34 (H) 03/16/2018   CO2 25 03/16/2018   TSH 2.624 01/15/2017   INR 0.95 09/02/2017    US Biopsy (kidney)  Result Date: 09/02/2017 INDICATION: 62 year old female with a history of renal failure. EXAM: ULTRASOUND-GUIDED MEDICAL RENAL BIOPSY MEDICATIONS: None. ANESTHESIA/SEDATION: Moderate (conscious) sedation was employed during this procedure. A total of Versed 2.0 mg and Fentanyl 1 Hunt mcg was administered intravenously. Moderate Sedation Time: 12 minutes. The patient's level of consciousness and vital signs were monitored continuously by radiology nursing throughout the procedure under my direct supervision. FLUOROSCOPY TIME:  None COMPLICATIONS: None PROCEDURE: Informed written consent was obtained from the patient after a thorough discussion of the procedural risks, benefits and alternatives. All questions were addressed. Maximal Sterile Barrier Technique was utilized including caps, mask, sterile gowns, sterile gloves, sterile drape, hand hygiene and skin antiseptic. A timeout was performed prior to the initiation of the procedure. Patient was positioned prone position on the gantry table. Images were stored sent to PACs. Once the patient is prepped and draped in the usual sterile fashion, the skin and subcutaneous tissues overlying the left kidney were generously infiltrated 1% lidocaine for local anesthesia. Using ultrasound guidance, a 15  gauge guide needle was advanced into the lower cortex of the left kidney. Once we confirmed location of the needle tip, 2 separate 16 gauge core biopsy were achieved. Two Gel-Foam pledgets were infused with a small amount of saline. The needle was removed. Final images were stored. The patient tolerated the procedure well and remained hemodynamically stable throughout. No complications were encountered and no significant blood loss encountered. IMPRESSION: Status post ultrasound-guided biopsy of left kidney for medical renal biopsy. Signed, Dulcy Fanny. Earleen Newport, DO [facility] Electronically Signed   By: Corrie Mckusick D.O.   On: 09/02/2017 11:00     Assessment & Plan:  Plan  I am having Ahmani A. Greaser maintain her BIOTIN PO, folic acid, vitamin G-95, Melatonin, Vitamin D-3, Simethicone (GAS-X PO), sodium chloride 0.9 % SOLN 250 mL with vedolizumab 300 MG SOLR 300 mg, Vitamin D2, AMBULATORY NON FORMULARY MEDICATION, metoprolol succinate, DULoxetine, ranitidine, ULORIC, hydrALAZINE, and HYDROcodone-acetaminophen.  No orders of the defined types were placed in this encounter.   Problem List Items Addressed This Visit      Unprioritized   Pes anserine bursitis    Improved Pt does not want referral at this time rto prn       Other Visit Diagnoses    Chronic pain of right knee    -  Primary   Relevant Orders   Pain Mgmt, Profile 8 w/Conf, U   High risk medication use       Relevant Orders   Pain Mgmt, Profile 8 w/Conf, U      Follow-up: Return if symptoms worsen or fail to improve, for annual exam, fasting.  Ann Held, DO

## 2018-05-15 NOTE — Addendum Note (Signed)
Addended by: Kem Boroughs D on: 05/15/2018 09:54 AM   Modules accepted: Orders

## 2018-05-15 NOTE — Patient Instructions (Signed)
Pes Anserine Bursitis  The pes anserine is an area on the inside of your knee, just below the joint, which is cushioned by a fluid-filled sac (bursa). Pes anserine bursitis is a condition that happens when this bursa gets swollen and irritated. The condition causes knee pain.  What are the causes?  This condition may be caused by:  · Making the same movement over and over.  · A hit to the inside of the leg.    What increases the risk?  This injury is most likely to develop in:  · Runners.  · Athletes who play sports that involve a lot of running and quick side-to-side movements (cutting).  · Athletes who play contact sports.  · People who swim using an inward angle of the knee, such as with the breaststroke.  · People with tight hamstring muscles.  · Females.  · People who are overweight.  · People with flat feet.  · People who have diabetes or osteoarthritis.    What are the signs or symptoms?  Symptoms of this condition include:  · Knee pain that gets better with rest and worse with activities like climbing stairs, walking, running, or getting in and out of a chair (common).  · Swelling.  · Warmth.  · Tenderness when pressing at the inside of the lower leg, just below the knee.    How is this diagnosed?  This condition may be diagnosed based on:  · Your symptoms.  · Your medical history.  · A physical exam.  · Tests, such as:  ? X-rays.  ? MRI and ultrasound. These tests are done to check for swelling and fluid buildup in the bursa and to look at muscles and tendons.    During your physical exam, your health care provider will press on the tendon attachment to see if you feel pain. He or she may also check your hip and knee motion and strength.  How is this treated?  This condition may be treated by:  · Resting your knee.  · Avoiding activities that cause pain.  · Icing the inside of your knee.  · Raising (elevating) your knee while resting.  · Sleeping with a pillow between your knees. This will cushion your  injured knee.  · Taking medicine to reduce pain and swelling.  · Having medicines injected into your knee.  · Doing strengthening and stretching exercises (physical therapy).    If these treatments do not work or if the condition keeps coming back, you may need to have surgery to remove the bursa.  Follow these instructions at home:  Managing pain, stiffness, and swelling  · If directed, apply ice to your knee.  ? Put ice in a plastic bag.  ? Place a towel between your skin and the bag.  ? Leave the ice on for 20 minutes, 2-3 times a day.  · While you are sitting, elevate your knee.  · While you are lying down, elevate your knee above the level of your heart.  · Take over-the-counter and prescription medicines only as told by your health care provider.  Activity  · Return to your normal activities as told by your health care provider. Ask your health care provider what activities are safe for you.  · Do exercises as told by your health care provider.  General instructions  · Sleep with a pillow between your knees.  · Do not use any tobacco products, such as cigarettes, chewing tobacco, and e-cigarettes. Tobacco can   delay healing. If you need help quitting, ask your health care provider.  · Keep all follow-up visits as told by your health care provider. This is important.  How is this prevented?  · Warm up and stretch before being active.  · Cool down and stretch after being active.  · Give your body time to rest between periods of activity.  · Make sure to use equipment that fits you.  · Be safe and responsible while being active to avoid falls.  · Do at least 150 minutes of moderate-intensity exercise each week, such as brisk walking or water aerobics.  · Maintain physical fitness, including:  ? Strength.  ? Flexibility.  ? Cardiovascular fitness.  ? Endurance.  Contact a health care provider if:  · Your symptoms do not improve.  · Your symptoms get worse.  This information is not intended to replace advice given  to you by your health care provider. Make sure you discuss any questions you have with your health care provider.  Document Released: 08/23/2005 Document Revised: 04/27/2016 Document Reviewed: 08/08/2015  Elsevier Interactive Patient Education © 2018 [employer]

## 2018-05-15 NOTE — Assessment & Plan Note (Signed)
Improved Pt does not want referral at this time rto prn

## 2018-05-16 LAB — PAIN MGMT, PROFILE 8 W/CONF, U
6 Acetylmorphine: NEGATIVE ng/mL (ref <10)
Alcohol Metabolites: NEGATIVE ng/mL (ref <500)
Amphetamines: NEGATIVE ng/mL (ref <500)
Benzodiazepines: NEGATIVE ng/mL (ref <100)
Buprenorphine, Urine: NEGATIVE ng/mL (ref <5)
Cocaine Metabolite: NEGATIVE ng/mL (ref <150)
Creatinine: 189.2 mg/dL
MDMA: NEGATIVE ng/mL (ref <500)
Marijuana Metabolite: NEGATIVE ng/mL (ref <20)
Opiates: NEGATIVE ng/mL (ref <100)
Oxidant: NEGATIVE ug/mL (ref <200)
Oxycodone: NEGATIVE ng/mL (ref <100)
pH: 8.7 (ref 4.5–9.0)

## 2018-05-17 ENCOUNTER — Encounter: Payer: Self-pay | Admitting: Family

## 2018-05-17 ENCOUNTER — Inpatient Hospital Stay: Payer: Medicare HMO | Attending: Hematology & Oncology | Admitting: Family

## 2018-05-17 ENCOUNTER — Other Ambulatory Visit: Payer: Self-pay

## 2018-05-17 ENCOUNTER — Inpatient Hospital Stay: Payer: Medicare HMO

## 2018-05-17 VITALS — BP 146/93 | HR 92 | Temp 98.2°F | Resp 16 | Wt 190.0 lb

## 2018-05-17 DIAGNOSIS — K51918 Ulcerative colitis, unspecified with other complication: Secondary | ICD-10-CM | POA: Insufficient documentation

## 2018-05-17 DIAGNOSIS — K91850 Pouchitis: Secondary | ICD-10-CM | POA: Insufficient documentation

## 2018-05-17 DIAGNOSIS — K51819 Other ulcerative colitis with unspecified complications: Secondary | ICD-10-CM | POA: Diagnosis not present

## 2018-05-17 DIAGNOSIS — I808 Phlebitis and thrombophlebitis of other sites: Secondary | ICD-10-CM

## 2018-05-17 DIAGNOSIS — D500 Iron deficiency anemia secondary to blood loss (chronic): Secondary | ICD-10-CM | POA: Diagnosis not present

## 2018-05-17 DIAGNOSIS — D6862 Lupus anticoagulant syndrome: Secondary | ICD-10-CM | POA: Diagnosis not present

## 2018-05-17 DIAGNOSIS — D6852 Prothrombin gene mutation: Secondary | ICD-10-CM

## 2018-05-17 LAB — CMP (CANCER CENTER ONLY)
ALT: 10 U/L (ref 0–44)
AST: 19 U/L (ref 15–41)
Albumin: 3.5 g/dL (ref 3.5–5.0)
Alkaline Phosphatase: 80 U/L (ref 38–126)
Anion gap: 11 (ref 5–15)
BUN: 30 mg/dL — ABNORMAL HIGH (ref 8–23)
CO2: 23 mmol/L (ref 22–32)
Calcium: 10.1 mg/dL (ref 8.9–10.3)
Chloride: 107 mmol/L (ref 98–111)
Creatinine: 2.34 mg/dL — ABNORMAL HIGH (ref 0.44–1.00)
GFR, Est AFR Am: 24 mL/min — ABNORMAL LOW (ref >60)
GFR, Estimated: 21 mL/min — ABNORMAL LOW (ref >60)
Glucose, Bld: 97 mg/dL (ref 70–99)
Potassium: 3.8 mmol/L (ref 3.5–5.1)
Sodium: 141 mmol/L (ref 135–145)
Total Bilirubin: 0.2 mg/dL — ABNORMAL LOW (ref 0.3–1.2)
Total Protein: 7.9 g/dL (ref 6.5–8.1)

## 2018-05-17 LAB — CBC WITH DIFFERENTIAL (CANCER CENTER ONLY)
Basophils Absolute: 0 10*3/uL (ref 0.0–0.1)
Basophils Relative: 0 %
Eosinophils Absolute: 0.3 10*3/uL (ref 0.0–0.5)
Eosinophils Relative: 4 %
HCT: 32.4 % — ABNORMAL LOW (ref 34.8–46.6)
Hemoglobin: 10.5 g/dL — ABNORMAL LOW (ref 11.6–15.9)
Lymphocytes Relative: 26 %
Lymphs Abs: 2.2 10*3/uL (ref 0.9–3.3)
MCH: 33 pg (ref 26.0–34.0)
MCHC: 32.4 g/dL (ref 32.0–36.0)
MCV: 101.9 fL — ABNORMAL HIGH (ref 81.0–101.0)
Monocytes Absolute: 0.7 10*3/uL (ref 0.1–0.9)
Monocytes Relative: 8 %
Neutro Abs: 5.3 10*3/uL (ref 1.5–6.5)
Neutrophils Relative %: 62 %
Platelet Count: 325 10*3/uL (ref 145–400)
RBC: 3.18 MIL/uL — ABNORMAL LOW (ref 3.70–5.32)
RDW: 13.8 % (ref 11.1–15.7)
WBC Count: 8.6 10*3/uL (ref 3.9–10.0)

## 2018-05-17 LAB — RETICULOCYTES
RBC.: 3.14 MIL/uL — ABNORMAL LOW (ref 3.70–5.45)
Retic Count, Absolute: 81.6 10*3/uL (ref 33.7–90.7)
Retic Ct Pct: 2.6 % — ABNORMAL HIGH (ref 0.7–2.1)

## 2018-05-17 LAB — IRON AND TIBC
Iron: 52 ug/dL (ref 41–142)
Saturation Ratios: 16 % — ABNORMAL LOW (ref 21–57)
TIBC: 315 ug/dL (ref 236–444)
UIBC: 263 ug/dL

## 2018-05-17 LAB — FERRITIN: Ferritin: 161 ng/mL (ref 11–307)

## 2018-05-17 NOTE — Progress Notes (Signed)
Hematology and Oncology Follow Up Visit  [name] [id number] 08/07/1956 62 y.o. 05/17/2018   Principle Diagnosis:  Recurrent iron deficiency anemia secondary to ulcerative colitis Right cephalic/basilic vein thrombus - transiently positive lupus anticoagulant  Current Therapy:   IV iron as indicated -lastdose received in May/June x 2   Interim History: [name] is here today for follow-up. She is still having some fatigue but attributes this to be being a side effect of Entyvio.  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  She occasionally has a small amount of bright red blood in her stool with UC. No other episodes of bleeding to report. No bruising or petechiae.  No fever, chills, n/v, cough, rash, dizziness, SOB, chest pain, abdominal pain or changes in bowel or bladder habits.  No swelling in her extremities at this time. She has tingling in her feet and feels pressure like she has socks on all the time.  She has occasional palpitations. This is unchanged.  No lymphadenopathy noted on exam.  He has maintained a good appetite and is staying well hydrated. Her weight is stable.   ECOG Performance Status: 1 - Symptomatic but completely ambulatory  Medications:  Allergies as of 05/17/2018      Reactions   Morphine Other (See Comments)   Gives me crazy dreams   Sulfa Antibiotics Hives   Sulfonamide Derivatives Itching, Swelling, Rash      Medication List        Accurate as of 05/17/18  8:55 AM. Always use your most recent med list.          AMBULATORY NON FORMULARY MEDICATION Medication Name: Nitroglycerine ointment 0.125 %  Apply a pea sized amount internally four times daily. Dispense 30 GM zero refill   BIOTIN PO Take 1 tablet by mouth daily.   DULoxetine 60 MG capsule Commonly known as:  CYMBALTA TAKE 2 CAPSULES BY MOUTH  DAILY   febuxostat 40 MG tablet Commonly known as:  ULORIC TAKE 1 TABLET BY MOUTH  DAILY   folic  acid 1 MG tablet Commonly known as:  FOLVITE Take 2 tablets (2 mg total) by mouth daily.   GAS-X PO Take 1 tablet by mouth at bedtime.   hydrALAZINE 10 MG tablet Commonly known as:  APRESOLINE Take 10 mg by mouth 3 (three) times daily.   HYDROcodone-acetaminophen 5-325 MG tablet Commonly known as:  NORCO/VICODIN Take 2 tablets by mouth every 4 (four) hours as needed for moderate pain or severe pain.   Melatonin 5 MG Tabs Take 5 mg at bedtime by mouth.   metoprolol succinate 100 MG 24 hr tablet Commonly known as:  TOPROL-XL Take 100 mg by mouth daily.   ranitidine 150 MG tablet Commonly known as:  ZANTAC TAKE 1 TABLET BY MOUTH TWO  TIMES DAILY   sodium chloride 0.9 % SOLN 250 mL with vedolizumab 300 MG SOLR 300 mg Inject 300 mg into the vein every 30 (thirty) days.   vitamin B-12 100 MCG tablet Commonly known as:  CYANOCOBALAMIN Take 100 mcg by mouth daily.   Vitamin D-3 5000 units Tabs Take 1 tablet by mouth daily.   Vitamin D2 2000 units Tabs Take by mouth daily.       Allergies:  Allergies  Allergen Reactions  . Morphine Other (See Comments)    Gives me crazy dreams  . Sulfa Antibiotics Hives  . Sulfonamide Derivatives Itching, Swelling and Rash    Past Medical History,  Surgical history, Social history, and Family History were reviewed and updated.  Review of Systems: All other 10 point review of systems is negative.   Physical Exam:  weight is 190 lb (86.2 kg). Her oral temperature is 98.2 F (36.8 C). Her blood pressure is 146/93 (abnormal) and her pulse is 92. Her respiration is 16 and oxygen saturation is 99%.   Wt Readings from Last 3 Encounters:  05/17/18 190 lb (86.2 kg)  05/15/18 190 lb 3.2 oz (86.3 kg)  05/12/18 188 lb 6 oz (85.4 kg)    Ocular: Sclerae unicteric, pupils equal, round and reactive to light Ear-nose-throat: Oropharynx clear, dentition fair Lymphatic: No cervical, supraclavicular or axillary adenopathy Lungs no rales or  rhonchi, good excursion bilaterally Heart regular rate and rhythm, no murmur appreciated Abd soft, nontender, positive bowel sounds, no liver or spleen tip palpated on exam, no fluid wave  MSK no focal spinal tenderness, no joint edema Neuro: non-focal, well-oriented, appropriate affect Breasts: Deferred   Lab Results  Component Value Date   WBC 8.6 05/17/2018   HGB 10.5 (L) 05/17/2018   HCT 32.4 (L) 05/17/2018   MCV 101.9 (H) 05/17/2018   PLT 325 05/17/2018   Lab Results  Component Value Date   FERRITIN 437 (H) 03/16/2018   IRON 65 03/16/2018   TIBC 313 03/16/2018   UIBC 248 03/16/2018   IRONPCTSAT 21 03/16/2018   Lab Results  Component Value Date   RETICCTPCT 2.7 (H) 03/16/2018   RBC 3.18 (L) 05/17/2018   No results found for: KPAFRELGTCHN, LAMBDASER, KAPLAMBRATIO No results found for: IGGSERUM, IGA, IGMSERUM No results found for: Odetta Pink, SPEI   Chemistry      Component Value Date/Time   NA 142 03/16/2018 0849   NA 142 09/08/2017 0923   K 3.5 03/16/2018 0849   K 3.7 09/08/2017 0923   CL 106 03/16/2018 0849   CL 97 (L) 09/08/2017 0923   CO2 25 03/16/2018 0849   CO2 28 09/08/2017 0923   BUN 34 (H) 03/16/2018 0849   BUN 49 (H) 09/08/2017 0923   CREATININE 2.16 (H) 03/16/2018 0849   CREATININE 3.1 (HH) 09/08/2017 0923      Component Value Date/Time   CALCIUM 10.2 03/16/2018 0849   CALCIUM 9.9 09/08/2017 0923   CALCIUM 8.9 04/12/2013 0851   ALKPHOS 82 03/16/2018 0849   ALKPHOS 89 (H) 09/08/2017 0923   AST 19 03/16/2018 0849   ALT 17 03/16/2018 0849   ALT 22 09/08/2017 0923   BILITOT 0.2 (L) 03/16/2018 0849      Impression and Plan: [name] is a very pleasant 62 yo caucasian female with iron deficiency anemia secondary to intermittent GI blood loss/malabsorption with UC. Hgb today is stable at 10.5, MCV 101 and platelet count 325. She still has some fatigue and palpitations at times.  We will see  what her iron studies show and bring her back in for infusion if needed.  We will plan to see her back in another 3 months for follow-up.  She will contact our office with any questions or concerns. We can certainly see her sooner if need be.   Laverna Peace, NP 9/11/20198:55 AM

## 2018-05-18 DIAGNOSIS — N179 Acute kidney failure, unspecified: Secondary | ICD-10-CM | POA: Diagnosis not present

## 2018-05-18 DIAGNOSIS — I129 Hypertensive chronic kidney disease with stage 1 through stage 4 chronic kidney disease, or unspecified chronic kidney disease: Secondary | ICD-10-CM | POA: Diagnosis not present

## 2018-05-18 DIAGNOSIS — Z6831 Body mass index (BMI) 31.0-31.9, adult: Secondary | ICD-10-CM | POA: Diagnosis not present

## 2018-05-18 DIAGNOSIS — R69 Illness, unspecified: Secondary | ICD-10-CM | POA: Diagnosis not present

## 2018-05-18 DIAGNOSIS — D631 Anemia in chronic kidney disease: Secondary | ICD-10-CM | POA: Diagnosis not present

## 2018-05-18 DIAGNOSIS — R768 Other specified abnormal immunological findings in serum: Secondary | ICD-10-CM | POA: Diagnosis not present

## 2018-05-18 DIAGNOSIS — N183 Chronic kidney disease, stage 3 (moderate): Secondary | ICD-10-CM | POA: Diagnosis not present

## 2018-05-18 DIAGNOSIS — E876 Hypokalemia: Secondary | ICD-10-CM | POA: Diagnosis not present

## 2018-05-18 DIAGNOSIS — M109 Gout, unspecified: Secondary | ICD-10-CM | POA: Diagnosis not present

## 2018-05-18 DIAGNOSIS — D509 Iron deficiency anemia, unspecified: Secondary | ICD-10-CM | POA: Diagnosis not present

## 2018-05-23 ENCOUNTER — Other Ambulatory Visit: Payer: Self-pay

## 2018-05-23 ENCOUNTER — Inpatient Hospital Stay: Payer: Medicare HMO

## 2018-05-23 VITALS — BP 148/88 | HR 95 | Temp 98.4°F | Resp 16

## 2018-05-23 DIAGNOSIS — D500 Iron deficiency anemia secondary to blood loss (chronic): Secondary | ICD-10-CM | POA: Diagnosis not present

## 2018-05-23 DIAGNOSIS — K91850 Pouchitis: Secondary | ICD-10-CM | POA: Diagnosis not present

## 2018-05-23 DIAGNOSIS — K51918 Ulcerative colitis, unspecified with other complication: Secondary | ICD-10-CM | POA: Diagnosis not present

## 2018-05-23 DIAGNOSIS — D6862 Lupus anticoagulant syndrome: Secondary | ICD-10-CM | POA: Diagnosis not present

## 2018-05-23 MED ORDER — SODIUM CHLORIDE 0.9 % IV SOLN
Freq: Once | INTRAVENOUS | Status: AC
  Administered 2018-05-23: 10:00:00 via INTRAVENOUS
  Filled 2018-05-23: qty 250

## 2018-05-23 MED ORDER — SODIUM CHLORIDE 0.9 % IV SOLN
510.0000 mg | Freq: Once | INTRAVENOUS | Status: AC
  Administered 2018-05-23: 510 mg via INTRAVENOUS
  Filled 2018-05-23: qty 17

## 2018-05-23 NOTE — Patient Instructions (Signed)

## 2018-05-24 DIAGNOSIS — K5100 Ulcerative (chronic) pancolitis without complications: Secondary | ICD-10-CM | POA: Diagnosis not present

## 2018-06-02 NOTE — Progress Notes (Signed)
Subjective:   [name] is a 62 y.o. female who presents for Medicare Annual (Subsequent) preventive examination.  Pt enjoys color by number on her phone. Helps take care of her 24 yr old mother. Has 3 dogs and a rabbit.   Review of Systems: No ROS.  Medicare Wellness Visit. Additional risk factors are reflected in the social history. Cardiac Risk Factors include: advanced age (>83mn, >>62women);sedentary lifestyle;obesity (BMI >30kg/m2);hypertension;dyslipidemia Sleep patterns: no issues per pt. Home Safety/Smoke Alarms: Feels safe in home. Smoke alarms in place. Lives in 1 story home. Boyfriend lives with her.    Female:   Pap-  Dr.Fontaine. Due Nov.     Mammo- utd    Dexa scan-  declines      [facility]- last reported 2018-Dr.Armbruster [facility]- UTD per pt. Dr.Shapiro.    Objective:     Vitals: BP 140/88 (BP Location: Left Arm, Patient Position: Sitting, Cuff Size: Normal)   Pulse 96   Ht 5' 3"  (1.6 m)   Wt 186 lb 6.4 oz (84.6 kg)   SpO2 96%   BMI 33.02 kg/m   Body mass index is 33.02 kg/m.  Advanced Directives 06/05/2018 05/23/2018 05/17/2018 03/16/2018 02/28/2018 01/12/2018 11/10/2017  Does Patient Have a Medical Advance Directive? No No No No No No No  Would patient like information on creating a medical advance directive? Yes (MAU/Ambulatory/Procedural Areas - Information given) - - - No - Patient declined - -  Pre-existing out of facility DNR order (yellow form or pink MOST form) - - - - - - -    Tobacco Social History   Tobacco Use  Smoking Status Former Smoker  . Packs/day: 1.00  . Years: 4.00  . Pack years: 4.00  . Types: Cigarettes  . Last attempt to quit: 05/21/1979  . Years since quitting: 39.0  Smokeless Tobacco Never Used     Counseling given: Not Answered   Clinical Intake: Pain : No/denies pain    Past Medical History:  Diagnosis Date  . Abdominal pain    Hx  . Allergy   . Anal stenosis   . Anemia   . Anxiety   . Arthritis   . Asthma    patient does not have inhaler  . Blood in stool    Hx  . Blood in urine    Hx  . Blood transfusion without reported diagnosis   . De Quervain's tenosynovitis   . Depression   . Difficulty urinating    Hx  . Easy bruising   . Esophagitis   . Fainting    History - resolved - due to dehydration  . Fatigue    Hx  . Fibroid    Hx  . Gastric polyp   . GERD (gastroesophageal reflux disease)   . 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 Hemorrhoids, external   . Hemorrhoids, internal   . Herpes, genital    vaginal treated 07/05/12 and pt states is resolved  . History of cervical dysplasia   . History of small bowel obstruction   . Hyperlipidemia    currently no meds  . Hyperparathyroidism   . Hypertension   . Hypokalemia    Hx  . 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 Obesity   . Ovarian cyst   . Poor dental hygiene   . Pulmonary nodule, right  right upper lobe  . Rectal bleeding    Hx  . Rectal pain    Hx  . Renal insufficiency    CKD - stage 2  . RLS (restless legs syndrome)    no meds  . Tooth infection 11/2016   right low  . Ulcerative colitis   . Visual disturbance    wears glasses  . Weakness generalized    Hx - patient denies generalized weakness  . Wears dentures    upper only   Past Surgical History:  Procedure Laterality Date  . ANAL DILATION    . CERVICAL BIOPSY  W/ LOOP ELECTRODE EXCISION    . CHOLECYSTECTOMY    . COLONOSCOPY     [name]  . fatty tumor removed from back     X 2  . HEMORRHOID SURGERY    . ILEOSTOMY CLOSURE    . RESTORATIVE PROCTOCOLECTOMY     with insertion of ileoanal J Pouch with loop ileostomy  . SIGMOIDOSCOPY    . TOTAL ABDOMINAL HYSTERECTOMY  1998   TAH/LSO  . TUBAL LIGATION    . UPPER GASTROINTESTINAL ENDOSCOPY     [name]   Family History  Problem Relation Age of Onset  . Ulcerative colitis Father   .  Hypertension Father   . Heart attack Father   . Hypertension Mother   . Heart disease Mother        s/p pci  . Ulcerative colitis Daughter   . Irritable bowel syndrome Unknown        grandchildren  . Diabetes Sister   . Cancer Sister        uterine  . Cancer Maternal Uncle        LUNG  . Colon cancer Neg Hx   . Esophageal cancer Neg Hx   . Stomach cancer Neg Hx   . Rectal cancer Neg Hx    Social History   Socioeconomic History  . Marital status: Divorced    Spouse name: Not on file  . Number of children: Not on file  . Years of education: Not on file  . Highest education level: Not on file  Occupational History  . Not on file  Social Needs  . Financial resource strain: Not on file  . Food insecurity:    Worry: Not on file    Inability: Not on file  . Transportation needs:    Medical: Not on file    Non-medical: Not on file  Tobacco Use  . Smoking status: Former Smoker    Packs/day: 1.00    Years: 4.00    Pack years: 4.00    Types: Cigarettes    Last attempt to quit: 05/21/1979    Years since quitting: 39.0  . Smokeless tobacco: Never Used  Substance and Sexual Activity  . Alcohol use: No    Alcohol/week: 0.0 standard drinks  . Drug use: No  . Sexual activity: Yes    Birth control/protection: Post-menopausal    Comment: 1st intercourse 62 yo-Fewer than 5 partners  Lifestyle  . Physical activity:    Days per week: Not on file    Minutes per session: Not on file  . Stress: Not on file  Relationships  . Social connections:    Talks on phone: Not on file    Gets together: Not on file    Attends religious service: Not on file    Active member of club or organization: Not on file    Attends meetings of clubs or  organizations: Not on file    Relationship status: Not on file  Other Topics Concern  . Not on file  Social History Narrative  . Not on file    Outpatient Encounter Medications as of 06/05/2018  Medication Sig  . AMBULATORY NON FORMULARY  MEDICATION Medication Name: Nitroglycerine ointment 0.125 %  Apply a pea sized amount internally four times daily. Dispense 30 GM zero refill (Patient taking differently: Medication Name: Nitroglycerine ointment 0.125 %  Apply a pea sized amount internally four times daily-PRN Dispense 30 GM zero refill)  . BIOTIN PO Take 1 tablet by mouth daily.   . Cholecalciferol (VITAMIN D-3) 5000 units TABS Take 1 tablet by mouth daily.  . DULoxetine (CYMBALTA) 60 MG capsule TAKE 2 CAPSULES BY MOUTH  DAILY  . Ergocalciferol (VITAMIN D2) 2000 units TABS Take by mouth daily.  . febuxostat (ULORIC) 40 MG tablet TAKE 1 TABLET BY MOUTH  DAILY  . folic acid (FOLVITE) 1 MG tablet Take 2 tablets (2 mg total) by mouth daily.  . hydrALAZINE (APRESOLINE) 10 MG tablet Take 10 mg by mouth 3 (three) times daily.  Marland Kitchen HYDROcodone-acetaminophen (NORCO/VICODIN) 5-325 MG tablet Take 2 tablets by mouth every 4 (four) hours as needed for moderate pain or severe pain.  . metoprolol succinate (TOPROL-XL) 100 MG 24 hr tablet Take 100 mg by mouth daily.   . ranitidine (ZANTAC) 150 MG tablet TAKE 1 TABLET BY MOUTH TWO  TIMES DAILY  . Simethicone (GAS-X PO) Take 1 tablet by mouth at bedtime.   . sodium chloride 0.9 % SOLN 250 mL with vedolizumab 300 MG SOLR 300 mg Inject 300 mg into the vein every 30 (thirty) days.  . vitamin B-12 (CYANOCOBALAMIN) 100 MCG tablet Take 100 mcg by mouth daily.   . Melatonin 5 MG TABS Take 5 mg at bedtime by mouth.   No facility-administered encounter medications on file as of 06/05/2018.     Activities of Daily Living In your present state of health, do you have any difficulty performing the following activities: 06/05/2018  Hearing? N  Vision? N  Difficulty concentrating or making decisions? N  Walking or climbing stairs? N  Dressing or bathing? N  Doing errands, shopping? N  Preparing Food and eating ? N  Using the Toilet? N  In the past six months, have you accidently leaked urine? Y  Do  you have problems with loss of bowel control? Y  Managing your Medications? N  Managing your Finances? N  Housekeeping or managing your Housekeeping? N  Some recent data might be hidden    Patient Care Team: Carollee Herter, Alferd Apa, DO as PCP - General Donato Heinz, MD (Nephrology) Armbruster, Carlota Raspberry, MD as Consulting Physician (Gastroenterology) Terrance Mass, MD as Consulting Physician (Gynecology) Rutherford Guys, MD as Consulting Physician (Ophthalmology) Nahser, Wonda Cheng, MD as Consulting Physician (Cardiology)    Assessment:   This is a routine wellness examination for [facility]. Physical assessment deferred to PCP.   Exercise Activities and Dietary recommendations Current Exercise Habits: The patient does not participate in regular exercise at present, Exercise limited by: None identified   Diet (meal preparation, eat out, water intake, caffeinated beverages, dairy products, fruits and vegetables): in general, an "unhealthy" diet Breakfast: cereal or cookie Lunch: chicken salad sandwich Dinner: fried chicken, rice, biscuit, mac and cheese  Goals    . Lose 20 lbs by next year.   (pt-stated)     Increase physical activity-walking  Fall Risk Fall Risk  06/05/2018 06/02/2017 03/30/2017 11/11/2015 08/18/2015  Falls in the past year? Yes Yes Yes No Yes  Number falls in past yr: 1 1 1  - 2 or more  Injury with Fall? - No No - No  Risk Factor Category  - - - - High Fall Risk  Risk for fall due to : - - - - Impaired balance/gait  Follow up Education provided;Falls prevention discussed Education provided Falls prevention discussed - Education provided;Falls prevention discussed   Depression Screen PHQ 2/9 Scores 06/05/2018 06/02/2017 05/10/2017 07/27/2016  PHQ - 2 Score 0 0 0 0     Cognitive Function MMSE - Mini Mental State Exam 06/05/2018 06/02/2017  Orientation to time 5 5  Orientation to Place 5 5  Registration 3 3  Attention/ Calculation 5 5  Recall 3 3    Language- name 2 objects 2 2  Language- repeat 1 1  Language- follow 3 step command 3 3  Language- read & follow direction 1 1  Write a sentence 1 1  Copy design 1 1  Total score 30 30        Immunization History  Administered Date(s) Administered  . Influenza Split 08/03/2011  . Influenza Whole 08/15/2007, 07/05/2008, 07/29/2009, 08/19/2010, 08/19/2012  . Influenza,inj,Quad PF,6+ Mos 07/23/2013, 06/12/2014, 06/02/2015  . Influenza-Unspecified 06/02/2016  . PPD Test 06/07/2011, 06/13/2012, 09/14/2013  . Pneumococcal Polysaccharide-23 07/23/2013  . Tdap 09/13/2011    Screening Tests Health Maintenance  Topic Date Due  . HIV Screening  12/14/1970  . COLONOSCOPY  06/02/2016  . INFLUENZA VACCINE  06/21/2022 (Originally 04/06/2018)  . MAMMOGRAM  01/19/2019  . TETANUS/TDAP  09/12/2021  . Hepatitis C Screening  Completed      Plan:    Please schedule your next medicare wellness visit with me in 1 yr.  Eat heart healthy diet (full of fruits, vegetables, whole grains, lean protein, water--limit salt, fat, and sugar intake) and increase physical activity as tolerated.  Continue doing brain stimulating activities (puzzles, reading, adult coloring books, staying active) to keep memory sharp.     I have personally reviewed and noted the following in the patient's chart:   . Medical and social history . Use of alcohol, tobacco or illicit drugs  . Current medications and supplements . Functional ability and status . Nutritional status . Physical activity . Advanced directives . List of other physicians . Hospitalizations, surgeries, and ER visits in previous 12 months . Vitals . Screenings to include cognitive, depression, and falls . Referrals and appointments  In addition, I have reviewed and discussed with patient certain preventive protocols, quality metrics, and best practice recommendations. A written personalized care plan for preventive services as well as general  preventive health recommendations were provided to patient.     Shela Nevin, [place]  06/05/2018

## 2018-06-05 ENCOUNTER — Ambulatory Visit (INDEPENDENT_AMBULATORY_CARE_PROVIDER_SITE_OTHER): Payer: Medicare HMO | Admitting: *Deleted

## 2018-06-05 ENCOUNTER — Encounter: Payer: Self-pay | Admitting: *Deleted

## 2018-06-05 VITALS — BP 140/88 | HR 96 | Ht 63.0 in | Wt 186.4 lb

## 2018-06-05 DIAGNOSIS — Z0000 Encounter for general adult medical examination without abnormal findings: Secondary | ICD-10-CM | POA: Diagnosis not present

## 2018-06-05 NOTE — Progress Notes (Signed)
reviewed

## 2018-06-05 NOTE — Patient Instructions (Signed)
Please schedule your next medicare wellness visit with me in 1 yr.  Eat heart healthy diet (full of fruits, vegetables, whole grains, lean protein, water--limit salt, fat, and sugar intake) and increase physical activity as tolerated.  Continue doing brain stimulating activities (puzzles, reading, adult coloring books, staying active) to keep memory sharp.    [name] , Thank you for taking time to come for your Medicare Wellness Visit. I appreciate your ongoing commitment to your health goals. Please review the following plan we discussed and let me know if I can assist you in the future.   These are the goals we discussed: Goals    . Lose 20 lbs by next year.   (pt-stated)     Increase physical activity-walking        This is a list of the screening recommended for you and due dates:  Health Maintenance  Topic Date Due  . HIV Screening  12/14/1968  . Colon Cancer Screening  06/02/2016  . Flu Shot  06/21/2022*  . Mammogram  01/19/2019  . Tetanus Vaccine  09/12/2021  .  Hepatitis C: One time screening is recommended by Center for Disease Control  (CDC) for  adults born from [birth year] through 1967.   Completed  *Topic was postponed. The date shown is not the original due date.    Health Maintenance for Postmenopausal Women Menopause is a normal process in which your reproductive ability comes to an end. This process happens gradually over a span of months to years, usually between the ages of 58 and 48. Menopause is complete when you have missed 12 consecutive menstrual periods. It is important to talk with your health care provider about some of the most common conditions that affect postmenopausal women, such as heart disease, cancer, and bone loss (osteoporosis). Adopting a healthy lifestyle and getting preventive care can help to promote your health and wellness. Those actions can also lower your chances of developing some of these common conditions. What should I know about  menopause? During menopause, you may experience a number of symptoms, such as:  Moderate-to-severe hot flashes.  Night sweats.  Decrease in sex drive.  Mood swings.  Headaches.  Tiredness.  Irritability.  Memory problems.  Insomnia.  Choosing to treat or not to treat menopausal changes is an individual decision that you make with your health care provider. What should I know about hormone replacement therapy and supplements? Hormone therapy products are effective for treating symptoms that are associated with menopause, such as hot flashes and night sweats. Hormone replacement carries certain risks, especially as you become older. If you are thinking about using estrogen or estrogen with progestin treatments, discuss the benefits and risks with your health care provider. What should I know about heart disease and stroke? Heart disease, heart attack, and stroke become more likely as you age. This may be due, in part, to the hormonal changes that your body experiences during menopause. These can affect how your body processes dietary fats, triglycerides, and cholesterol. Heart attack and stroke are both medical emergencies. There are many things that you can do to help prevent heart disease and stroke:  Have your blood pressure checked at least every 1-2 years. High blood pressure causes heart disease and increases the risk of stroke.  If you are 41-46 years old, ask your health care provider if you should take aspirin to prevent a heart attack or a stroke.  Do not use any tobacco products, including cigarettes, chewing tobacco, or electronic  cigarettes. If you need help quitting, ask your health care provider.  It is important to eat a healthy diet and maintain a healthy weight. ? Be sure to include plenty of vegetables, fruits, low-fat dairy products, and lean protein. ? Avoid eating foods that are high in solid fats, added sugars, or salt (sodium).  Get regular exercise. This  is one of the most important things that you can do for your health. ? Try to exercise for at least 150 minutes each week. The type of exercise that you do should increase your heart rate and make you sweat. This is known as moderate-intensity exercise. ? Try to do strengthening exercises at least twice each week. Do these in addition to the moderate-intensity exercise.  Know your numbers.Ask your health care provider to check your cholesterol and your blood glucose. Continue to have your blood tested as directed by your health care provider.  What should I know about cancer screening? There are several types of cancer. Take the following steps to reduce your risk and to catch any cancer development as early as possible. Breast Cancer  Practice breast self-awareness. ? This means understanding how your breasts normally appear and feel. ? It also means doing regular breast self-exams. Let your health care provider know about any changes, no matter how small.  If you are 56 or older, have a clinician do a breast exam (clinical breast exam or CBE) every year. Depending on your age, family history, and medical history, it may be recommended that you also have a yearly breast X-ray (mammogram).  If you have a family history of breast cancer, talk with your health care provider about genetic screening.  If you are at high risk for breast cancer, talk with your health care provider about having an MRI and a mammogram every year.  Breast cancer (BRCA) gene test is recommended for women who have family members with BRCA-related cancers. Results of the assessment will determine the need for genetic counseling and BRCA1 and for BRCA2 testing. BRCA-related cancers include these types: ? Breast. This occurs in males or females. ? Ovarian. ? Tubal. This may also be called fallopian tube cancer. ? Cancer of the abdominal or pelvic lining (peritoneal cancer). ? Prostate. ? Pancreatic.  Cervical,  Uterine, and Ovarian Cancer Your health care provider may recommend that you be screened regularly for cancer of the pelvic organs. These include your ovaries, uterus, and vagina. This screening involves a pelvic exam, which includes checking for microscopic changes to the surface of your cervix (Pap test).  For women ages 21-65, health care providers may recommend a pelvic exam and a Pap test every three years. For women ages 39-65, they may recommend the Pap test and pelvic exam, combined with testing for human papilloma virus (HPV), every five years. Some types of HPV increase your risk of cervical cancer. Testing for HPV may also be done on women of any age who have unclear Pap test results.  Other health care providers may not recommend any screening for nonpregnant women who are considered low risk for pelvic cancer and have no symptoms. Ask your health care provider if a screening pelvic exam is right for you.  If you have had past treatment for cervical cancer or a condition that could lead to cancer, you need Pap tests and screening for cancer for at least 20 years after your treatment. If Pap tests have been discontinued for you, your risk factors (such as having a new sexual partner)  need to be reassessed to determine if you should start having screenings again. Some women have medical problems that increase the chance of getting cervical cancer. In these cases, your health care provider may recommend that you have screening and Pap tests more often.  If you have a family history of uterine cancer or ovarian cancer, talk with your health care provider about genetic screening.  If you have vaginal bleeding after reaching menopause, tell your health care provider.  There are currently no reliable tests available to screen for ovarian cancer.  Lung Cancer Lung cancer screening is recommended for adults 62-20 years old who are at high risk for lung cancer because of a history of smoking. A  yearly low-dose CT scan of the lungs is recommended if you:  Currently smoke.  Have a history of at least 30 pack-years of smoking and you currently smoke or have quit within the past 15 years. A pack-year is smoking an average of one pack of cigarettes per day for one year.  Yearly screening should:  Continue until it has been 15 years since you quit.  Stop if you develop a health problem that would prevent you from having lung cancer treatment.  Colorectal Cancer  This type of cancer can be detected and can often be prevented.  Routine colorectal cancer screening usually begins at age 62 and continues through age 45.  If you have risk factors for colon cancer, your health care provider may recommend that you be screened at an earlier age.  If you have a family history of colorectal cancer, talk with your health care provider about genetic screening.  Your health care provider may also recommend using home test kits to check for hidden blood in your stool.  A small camera at the end of a tube can be used to examine your colon directly (sigmoidoscopy or colonoscopy). This is done to check for the earliest forms of colorectal cancer.  Direct examination of the colon should be repeated every 5-10 years until age 5. However, if early forms of precancerous polyps or small growths are found or if you have a family history or genetic risk for colorectal cancer, you may need to be screened more often.  Skin Cancer  Check your skin from head to toe regularly.  Monitor any moles. Be sure to tell your health care provider: ? About any new moles or changes in moles, especially if there is a change in a mole's shape or color. ? If you have a mole that is larger than the size of a pencil eraser.  If any of your family members has a history of skin cancer, especially at a young age, talk with your health care provider about genetic screening.  Always use sunscreen. Apply sunscreen liberally  and repeatedly throughout the day.  Whenever you are outside, protect yourself by wearing long sleeves, pants, a wide-brimmed hat, and sunglasses.  What should I know about osteoporosis? Osteoporosis is a condition in which bone destruction happens more quickly than new bone creation. After menopause, you may be at an increased risk for osteoporosis. To help prevent osteoporosis or the bone fractures that can happen because of osteoporosis, the following is recommended:  If you are 22-37 years old, get at least 1,000 mg of calcium and at least 600 mg of vitamin D per day.  If you are older than age 64 but younger than age 55, get at least 1,200 mg of calcium and at least 600 mg of  vitamin D per day.  If you are older than age 67, get at least 1,200 mg of calcium and at least 800 mg of vitamin D per day.  Smoking and excessive alcohol intake increase the risk of osteoporosis. Eat foods that are rich in calcium and vitamin D, and do weight-bearing exercises several times each week as directed by your health care provider. What should I know about how menopause affects my mental health? Depression may occur at any age, but it is more common as you become older. Common symptoms of depression include:  Low or sad mood.  Changes in sleep patterns.  Changes in appetite or eating patterns.  Feeling an overall lack of motivation or enjoyment of activities that you previously enjoyed.  Frequent crying spells.  Talk with your health care provider if you think that you are experiencing depression. What should I know about immunizations? It is important that you get and maintain your immunizations. These include:  Tetanus, diphtheria, and pertussis (Tdap) booster vaccine.  Influenza every year before the flu season begins.  Pneumonia vaccine.  Shingles vaccine.  Your health care provider may also recommend other immunizations. This information is not intended to replace advice given to you  by your health care provider. Make sure you discuss any questions you have with your health care provider. Document Released: 10/15/2005 Document Revised: 03/12/2016 Document Reviewed: 05/27/2015 Elsevier Interactive Patient Education  2018 [employer].

## 2018-06-22 DIAGNOSIS — K5100 Ulcerative (chronic) pancolitis without complications: Secondary | ICD-10-CM | POA: Diagnosis not present

## 2018-07-03 ENCOUNTER — Telehealth: Payer: Self-pay | Admitting: Gastroenterology

## 2018-07-03 MED ORDER — RIFAXIMIN 550 MG PO TABS: 550.0000 mg | ORAL_TABLET | Freq: Two times a day (BID) | ORAL | 1 refills | Status: DC

## 2018-07-03 NOTE — Telephone Encounter (Signed)
Lm for pt with Dr. Doyne Keel comments and recommendations.  Will sent script for Xifaxan 550mg , bid for 2 weeks with 1 refill (2 to 4 week course).  If this is too expensive we can send tinidazole 500 mg Bid for 30 days. Asked pt to call back to discuss.

## 2018-07-03 NOTE — Telephone Encounter (Signed)
Glad to hear it worked. I think Rifaximin would be fine for her longer term but the issue is cost, I had to give her a free sample. I don't know if her pharmacy would be able to tell her how much a 2 to 4 week course of Rifaximin would cost and if it would be covered. If not, would recommend tinidazole 500mg  BID for one month as she has responded to this and got benefit from it in the past. Thanks

## 2018-07-03 NOTE — Telephone Encounter (Signed)
Dr. Armbruster, please advise. Thank you.  

## 2018-07-03 NOTE — Telephone Encounter (Signed)
Patient states Dr.Armbruster put her on medication Xifaxan 550 mg to try and it worked, but now she is out and has the diarrhea again. Patient states she would like the medication prescribed but has to have Dr.Armbruster's approval due to insurance coverage.

## 2018-07-04 ENCOUNTER — Other Ambulatory Visit: Payer: Self-pay | Admitting: Family Medicine

## 2018-07-04 NOTE — Telephone Encounter (Signed)
Prior auth approved for Xifaxan 550 mg BID for up to 30 day supply at No cost (including refills).

## 2018-07-04 NOTE — Telephone Encounter (Signed)
That's great news, thanks Jan

## 2018-07-15 DIAGNOSIS — R69 Illness, unspecified: Secondary | ICD-10-CM | POA: Diagnosis not present

## 2018-07-20 DIAGNOSIS — K5100 Ulcerative (chronic) pancolitis without complications: Secondary | ICD-10-CM | POA: Diagnosis not present

## 2018-07-31 ENCOUNTER — Other Ambulatory Visit: Payer: Self-pay

## 2018-07-31 ENCOUNTER — Telehealth: Payer: Self-pay | Admitting: Gastroenterology

## 2018-07-31 MED ORDER — TINIDAZOLE 500 MG PO TABS: 500.0000 mg | ORAL_TABLET | Freq: Two times a day (BID) | ORAL | 0 refills | Status: DC

## 2018-07-31 NOTE — Telephone Encounter (Signed)
Pt aware and script sent to pharmacy.

## 2018-07-31 NOTE — Telephone Encounter (Signed)
See additional phone note.

## 2018-07-31 NOTE — Telephone Encounter (Signed)
Left message for pt to call back.  Pt states that the xifaxin made her worse, reports it caused the diarrhea to worsen and there was blood in her stool. States the diarrhea is not as bad during the day but late afternoon she reports it is watery diarrhea. She is still seeing a small amount of blood in her stool. Please advise.

## 2018-07-31 NOTE — Telephone Encounter (Signed)
Pt called stating that she was returning your call. Pls call her again.

## 2018-07-31 NOTE — Telephone Encounter (Signed)
Sorry to hear this, she previously did well with a sample which led to the prescription. Why don't we try tinidazole 500mg  BID for one month and see how she does, she has responded well to that in the past. Thanks

## 2018-08-04 ENCOUNTER — Other Ambulatory Visit: Payer: Self-pay | Admitting: Family Medicine

## 2018-08-04 DIAGNOSIS — F32A Depression, unspecified: Secondary | ICD-10-CM

## 2018-08-04 DIAGNOSIS — F329 Major depressive disorder, single episode, unspecified: Secondary | ICD-10-CM

## 2018-08-16 ENCOUNTER — Inpatient Hospital Stay: Payer: Medicare HMO

## 2018-08-16 ENCOUNTER — Encounter: Payer: Self-pay | Admitting: Family

## 2018-08-16 ENCOUNTER — Other Ambulatory Visit: Payer: Self-pay

## 2018-08-16 ENCOUNTER — Inpatient Hospital Stay: Payer: Medicare HMO | Attending: Hematology & Oncology | Admitting: Family

## 2018-08-16 VITALS — HR 100 | Temp 98.3°F | Resp 20 | Wt 190.0 lb

## 2018-08-16 DIAGNOSIS — D500 Iron deficiency anemia secondary to blood loss (chronic): Secondary | ICD-10-CM | POA: Diagnosis not present

## 2018-08-16 DIAGNOSIS — I808 Phlebitis and thrombophlebitis of other sites: Secondary | ICD-10-CM

## 2018-08-16 DIAGNOSIS — K51918 Ulcerative colitis, unspecified with other complication: Secondary | ICD-10-CM | POA: Diagnosis not present

## 2018-08-16 LAB — CBC WITH DIFFERENTIAL (CANCER CENTER ONLY)
Abs Immature Granulocytes: 0.06 10*3/uL (ref 0.00–0.07)
Basophils Absolute: 0.1 10*3/uL (ref 0.0–0.1)
Basophils Relative: 1 %
Eosinophils Absolute: 0.4 10*3/uL (ref 0.0–0.5)
Eosinophils Relative: 4 %
HCT: 37.5 % (ref 36.0–46.0)
Hemoglobin: 11.5 g/dL — ABNORMAL LOW (ref 12.0–15.0)
Immature Granulocytes: 1 %
Lymphocytes Relative: 27 %
Lymphs Abs: 2.8 10*3/uL (ref 0.7–4.0)
MCH: 32 pg (ref 26.0–34.0)
MCHC: 30.7 g/dL (ref 30.0–36.0)
MCV: 104.5 fL — ABNORMAL HIGH (ref 80.0–100.0)
Monocytes Absolute: 0.8 10*3/uL (ref 0.1–1.0)
Monocytes Relative: 8 %
Neutro Abs: 6.1 10*3/uL (ref 1.7–7.7)
Neutrophils Relative %: 59 %
Platelet Count: 361 10*3/uL (ref 150–400)
RBC: 3.59 MIL/uL — ABNORMAL LOW (ref 3.87–5.11)
RDW: 13.3 % (ref 11.5–15.5)
WBC Count: 10.3 10*3/uL (ref 4.0–10.5)
nRBC: 0 % (ref 0.0–0.2)

## 2018-08-16 LAB — IRON AND TIBC
Iron: 65 ug/dL (ref 41–142)
Saturation Ratios: 21 % (ref 21–57)
TIBC: 319 ug/dL (ref 236–444)
UIBC: 253 ug/dL (ref 120–384)

## 2018-08-16 LAB — CMP (CANCER CENTER ONLY)
ALT: 12 U/L (ref 0–44)
AST: 15 U/L (ref 15–41)
Albumin: 4.1 g/dL (ref 3.5–5.0)
Alkaline Phosphatase: 67 U/L (ref 38–126)
Anion gap: 9 (ref 5–15)
BUN: 33 mg/dL — ABNORMAL HIGH (ref 8–23)
CO2: 29 mmol/L (ref 22–32)
Calcium: 10.6 mg/dL — ABNORMAL HIGH (ref 8.9–10.3)
Chloride: 101 mmol/L (ref 98–111)
Creatinine: 2.38 mg/dL — ABNORMAL HIGH (ref 0.44–1.00)
GFR, Est AFR Am: 24 mL/min — ABNORMAL LOW (ref >60)
GFR, Estimated: 21 mL/min — ABNORMAL LOW (ref >60)
Glucose, Bld: 110 mg/dL — ABNORMAL HIGH (ref 70–99)
Potassium: 4.7 mmol/L (ref 3.5–5.1)
Sodium: 139 mmol/L (ref 135–145)
Total Bilirubin: 0.3 mg/dL (ref 0.3–1.2)
Total Protein: 7.3 g/dL (ref 6.5–8.1)

## 2018-08-16 LAB — FERRITIN: Ferritin: 174 ng/mL (ref 11–307)

## 2018-08-16 LAB — RETICULOCYTES
Immature Retic Fract: 16.3 % — ABNORMAL HIGH (ref 2.3–15.9)
RBC.: 3.59 MIL/uL — ABNORMAL LOW (ref 3.87–5.11)
Retic Count, Absolute: 86.5 10*3/uL (ref 19.0–186.0)
Retic Ct Pct: 2.4 % (ref 0.4–3.1)

## 2018-08-16 NOTE — Progress Notes (Signed)
Hematology and Oncology Follow Up Visit  Jeanette [name] [id number] 07-Mar-1956 62 y.o. 08/16/2018   Principle Diagnosis:  Recurrent iron deficiency anemia secondary to ulcerative colitis Right cephalic/basilic vein thrombus - transiently positive lupus anticoagulant  Current Therapy:   IV iron as indicated    Interim History: Jeanette [name] is here today for follow-up. She is symptomatic with fatigue at times.  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  She still has a small amount of blood in her stool from time to time.  She has an appointment with Dr. Marval [name] Friday so we will forward today's labs to [facility].  No fever, chills, n/v, cough, rash, dizziness, chest pain, palpitations, abdominal pain or changes in bowel or bladder habits.  She has occasional episodes of SOB with exertion due to asthma.  No swelling or tenderness in her extremities. She has intermittent numbness and tingling in her toes.  No falls or syncopal episodes.  No lymphadenopathy noted on exam.  No bruising or petechiae.  She has maintained a good appetite and is staying well hydrated. Her weight is stable.   ECOG Performance Status: 1 - Symptomatic but completely ambulatory  Medications:  Allergies as of 08/16/2018      Reactions   Morphine Other (See Comments)   Gives me crazy dreams Gives me crazy dreams   Sulfa Antibiotics Hives   Sulfonamide Derivatives Itching, Swelling, Rash      Medication List        Accurate as of 08/16/18  8:45 AM. Always use your most recent med list.          AMBULATORY NON FORMULARY MEDICATION Medication Name: Nitroglycerine ointment 0.125 %  Apply a pea sized amount internally four times daily. Dispense 30 GM zero refill   BIOTIN PO Take 1 tablet by mouth daily.   DULoxetine 60 MG capsule Commonly known as:  CYMBALTA TAKE 2 CAPSULES BY MOUTH  DAILY   folic acid 1 MG tablet Commonly known as:  FOLVITE Take 2 tablets (2 mg total) by  mouth daily.   GAS-X PO Take 1 tablet by mouth at bedtime.   hydrALAZINE 10 MG tablet Commonly known as:  APRESOLINE Take 10 mg by mouth 3 (three) times daily.   HYDROcodone-acetaminophen 5-325 MG tablet Commonly known as:  NORCO/VICODIN Take 2 tablets by mouth every 4 (four) hours as needed for moderate pain or severe pain.   Melatonin 5 MG Tabs Take 5 mg at bedtime by mouth.   metoprolol succinate 100 MG 24 hr tablet Commonly known as:  TOPROL-XL Take 100 mg by mouth daily.   ranitidine 150 MG tablet Commonly known as:  ZANTAC TAKE 1 TABLET BY MOUTH TWO  TIMES DAILY   rifaximin 550 MG Tabs tablet Commonly known as:  XIFAXAN Take 1 tablet (550 mg total) by mouth 2 (two) times daily.   sodium chloride 0.9 % SOLN 250 mL with vedolizumab 300 MG SOLR 300 mg Inject 300 mg into the vein every 30 (thirty) days.   tinidazole 500 MG tablet Commonly known as:  TINDAMAX Take 1 tablet (500 mg total) by mouth 2 (two) times daily.   ULORIC 40 MG tablet Generic drug:  febuxostat TAKE 1 TABLET (40 MG TOTAL) BY MOUTH DAILY.   vitamin B-12 100 MCG tablet Commonly known as:  CYANOCOBALAMIN Take 100 mcg by mouth daily.   Vitamin D-3 125 MCG (5000 UT) Tabs Take 1 tablet by mouth daily.   Vitamin D2 50 MCG (  2000 UT) Tabs Take by mouth daily.       Allergies:  Allergies  Allergen Reactions  . Morphine Other (See Comments)    Gives me crazy dreams Gives me crazy dreams  . Sulfa Antibiotics Hives  . Sulfonamide Derivatives Itching, Swelling and Rash    Past Medical History, Surgical history, Social history, and Family History were reviewed and updated.  Review of Systems: All other 10 point review of systems is negative.   Physical Exam:  weight is 190 lb (86.2 kg). Her oral temperature is 98.3 F (36.8 C). Her pulse is 100. Her respiration is 20 and oxygen saturation is 100%.   Wt Readings from Last 3 Encounters:  08/16/18 190 lb (86.2 kg)  06/05/18 186 lb 6.4 oz  (84.6 kg)  05/17/18 190 lb (86.2 kg)    Ocular: Sclerae unicteric, pupils equal, round and reactive to light Ear-nose-throat: Oropharynx clear, dentition fair Lymphatic: No cervical, supraclavicular or axillary adenopathy Lungs no rales or rhonchi, good excursion bilaterally Heart regular rate and rhythm, no murmur appreciated Abd soft, nontender, positive bowel sounds, no liver or spleen tip palpated on exam, no fluid wave  MSK no focal spinal tenderness, no joint edema Neuro: non-focal, well-oriented, appropriate affect Breasts: Deferred   Lab Results  Component Value Date   WBC 10.3 08/16/2018   HGB 11.5 (L) 08/16/2018   HCT 37.5 08/16/2018   MCV 104.5 (H) 08/16/2018   PLT 361 08/16/2018   Lab Results  Component Value Date   FERRITIN 161 05/17/2018   IRON 52 05/17/2018   TIBC 315 05/17/2018   UIBC 263 05/17/2018   IRONPCTSAT 16 (L) 05/17/2018   Lab Results  Component Value Date   RETICCTPCT 2.4 08/16/2018   RBC 3.59 (L) 08/16/2018   RBC 3.59 (L) 08/16/2018   No results found for: KPAFRELGTCHN, LAMBDASER, KAPLAMBRATIO No results found for: IGGSERUM, IGA, IGMSERUM No results found for: Jeanette [name], SPEI   Chemistry      Component Value Date/Time   NA 139 08/16/2018 0745   NA 142 09/08/2017 0923   K 4.7 08/16/2018 0745   K 3.7 09/08/2017 0923   CL 101 08/16/2018 0745   CL 97 (L) 09/08/2017 0923   CO2 29 08/16/2018 0745   CO2 28 09/08/2017 0923   BUN 33 (H) 08/16/2018 0745   BUN 49 (H) 09/08/2017 0923   CREATININE 2.38 (H) 08/16/2018 0745   CREATININE 3.1 (HH) 09/08/2017 0923      Component Value Date/Time   CALCIUM 10.6 (H) 08/16/2018 0745   CALCIUM 9.9 09/08/2017 0923   CALCIUM 8.9 04/12/2013 0851   ALKPHOS 67 08/16/2018 0745   ALKPHOS 89 (H) 09/08/2017 0923   AST 15 08/16/2018 0745   ALT 12 08/16/2018 0745   ALT 22 09/08/2017 0923   BILITOT 0.3 08/16/2018 0745       Impression and Plan: Ms.  [name] is a very pleasant 62 yo caucasian female with iron deficiency anemia secondary to intermittent GI blood loss/malabsorption with UC. Hgb is up to 11.5, MCV 104.  We will see what her iron studies show and bring her back in for infusion if needed.  We will plan to see her back in another 3 months.  She will contact our office with any questions or concerns. We can certainly see her sooner if need be.   Laverna Peace, NP 12/11/20198:45 AM

## 2018-08-17 DIAGNOSIS — K5100 Ulcerative (chronic) pancolitis without complications: Secondary | ICD-10-CM | POA: Diagnosis not present

## 2018-08-23 DIAGNOSIS — N183 Chronic kidney disease, stage 3 (moderate): Secondary | ICD-10-CM | POA: Diagnosis not present

## 2018-08-23 DIAGNOSIS — M109 Gout, unspecified: Secondary | ICD-10-CM | POA: Diagnosis not present

## 2018-08-23 DIAGNOSIS — N179 Acute kidney failure, unspecified: Secondary | ICD-10-CM | POA: Diagnosis not present

## 2018-08-23 DIAGNOSIS — R768 Other specified abnormal immunological findings in serum: Secondary | ICD-10-CM | POA: Diagnosis not present

## 2018-08-23 DIAGNOSIS — R69 Illness, unspecified: Secondary | ICD-10-CM | POA: Diagnosis not present

## 2018-08-23 DIAGNOSIS — Z6831 Body mass index (BMI) 31.0-31.9, adult: Secondary | ICD-10-CM | POA: Diagnosis not present

## 2018-08-23 DIAGNOSIS — D509 Iron deficiency anemia, unspecified: Secondary | ICD-10-CM | POA: Diagnosis not present

## 2018-08-23 DIAGNOSIS — D631 Anemia in chronic kidney disease: Secondary | ICD-10-CM | POA: Diagnosis not present

## 2018-08-23 DIAGNOSIS — I129 Hypertensive chronic kidney disease with stage 1 through stage 4 chronic kidney disease, or unspecified chronic kidney disease: Secondary | ICD-10-CM | POA: Diagnosis not present

## 2018-08-23 DIAGNOSIS — E876 Hypokalemia: Secondary | ICD-10-CM | POA: Diagnosis not present

## 2018-08-25 ENCOUNTER — Telehealth: Payer: Self-pay | Admitting: *Deleted

## 2018-08-25 DIAGNOSIS — M109 Gout, unspecified: Secondary | ICD-10-CM

## 2018-08-25 NOTE — Telephone Encounter (Signed)
Copied from [place] [id number]. Topic: General - Other >> Aug 25, 2018 12:10 PM Leward Quan A wrote: Reason for CRM: Patient called to say that per pharmacy she need either a change of medication or a prior authorization for ULORIC 40 MG tablet. Stated that she will be out in a week. Please advise. Ph# [phone]

## 2018-08-29 NOTE — Telephone Encounter (Signed)
Called pharmacy and they stated that insurance will pay for allopurinol.  Do you want to change?  If not I will have to call ins  [phone]

## 2018-08-31 MED ORDER — ALLOPURINOL 300 MG PO TABS: 300.0000 mg | ORAL_TABLET | Freq: Every day | ORAL | 0 refills | Status: DC

## 2018-08-31 NOTE — Telephone Encounter (Signed)
Can change to allopurinol 300 mg if pt ok with it  #30 1 po qd  Check bmp  And uric acid in 1 month

## 2018-08-31 NOTE — Telephone Encounter (Signed)
Spoke with patient and she was ok with change.  Medication sent in.  She will call back to make lab only appt in 1 month around 10/01/18

## 2018-09-12 ENCOUNTER — Telehealth: Payer: Self-pay | Admitting: Gastroenterology

## 2018-09-12 NOTE — Telephone Encounter (Signed)
The pt has had an insurance change this year to [place] and states that [facility] pt assistance will be faxing forms over to be completed.  Pt assured we will complete as soon as received and call her to come pick up and complete her part.

## 2018-09-13 ENCOUNTER — Ambulatory Visit: Payer: Medicare HMO | Admitting: Gynecology

## 2018-09-13 ENCOUNTER — Encounter: Payer: Self-pay | Admitting: Gynecology

## 2018-09-13 VITALS — BP 130/84

## 2018-09-13 DIAGNOSIS — N63 Unspecified lump in unspecified breast: Secondary | ICD-10-CM

## 2018-09-13 NOTE — Progress Notes (Signed)
    [name] [birth year] [id number]        63 y.o.  [id number] presents having felt a lump in her left breast several days ago.  It feels tender.  Never noticed this before.  Last mammogram 01/2018 was normal  Past medical history,surgical history, problem list, medications, allergies, family history and social history were all reviewed and documented in the EPIC chart.  Directed ROS with pertinent positives and negatives documented in the history of present illness/assessment and plan.  Exam: Caryn Bee assistant Vitals:   09/13/18 1408  BP: 130/84   General appearance:  Normal Both breasts examined lying and sitting.  Right without masses retractions discharge adenopathy.  Left with smaller than pea-sized nodule 2 o'clock position 3 fingerbreadths off the areola.  No overlying skin changes, nipple discharge or axillary adenopathy  Assessment/Plan:  63 y.o. [id number] small nodule left breast.  Probable fibrocystic changes.  Discussed differential up to and including breast cancer.  Recommend diagnostic mammography and ultrasound.  Various scenarios were up to and including biopsy.  Patient knows to call our office if she does not hear from [facility] to schedule the studies within the next 2 weeks.    Anastasio Auerbach MD, 2:48 PM 09/13/2018

## 2018-09-13 NOTE — Patient Instructions (Signed)
[facility] will call you to schedule a mammogram and ultrasound.  If you do not hear from them within 2 weeks call my office.

## 2018-09-14 ENCOUNTER — Other Ambulatory Visit: Payer: Self-pay

## 2018-09-15 ENCOUNTER — Telehealth: Payer: Self-pay | Admitting: Gastroenterology

## 2018-09-15 NOTE — Telephone Encounter (Signed)
Pt says she faxed over her proof of income but needs to change the amount.  She wrote the amount for 2018 instead of 2019. Please CB.

## 2018-09-15 NOTE — Telephone Encounter (Signed)
Patient called and reported her husband is faxing over new forms.  She provided the incorrect amount of income from last year.  New information she is faxing has the correct amount.  Address to ship to is [facility].  She will pick up.

## 2018-09-18 ENCOUNTER — Telehealth: Payer: Self-pay | Admitting: Family

## 2018-09-18 NOTE — Telephone Encounter (Signed)
Appointments adjusted letter/calendar mailed

## 2018-09-18 NOTE — Telephone Encounter (Signed)
rec'd fax from [employer] indicating they have rec'd the request for Pt Assistance from [employer] and are processing the request. CASE Id: [id number].  Questions can be routed to [phone] Mon - Fri 8am to 8pm.

## 2018-09-18 NOTE — Telephone Encounter (Signed)
Dr Doyne Keel patient who is on Entyvio.  She is applying for patient assistance, not a co-pay card. Patient has supplied her income information and she has signed the forms. The home infusion service she has does not have any familiarity with the program. Patient was on this program last year but home infusion Colin Mulders) does not know where the [name] was coming from or how it was being billed. Spoke with Mickle Plumb (ext [phone]), Crystal (ext [phone]) both of whom say they do not know anything about the patient assistance program for [facility] but are familiar with "co-pay" cards. This is not an option because she has Medicare. I also left a message for a return call from the pharmacy for [place] for a "Santiago Glad." [phone]. Forms faxed. List this office as the delivery site for the medication until this can be sorted out. The patient says she cannot afford the cost of infusion at the hospital.

## 2018-09-20 NOTE — Telephone Encounter (Signed)
Spoke with the [facility] patient assistance case Freight forwarder.  The patient has been approved for Entyvio from 09/20/2018 until 09/06/2019. The cost of the infusion is still her responsibility. She will receive free drug. [place] will contact us in 48 hours to discuss delivery of drug. The patient will receive a letter explaining this. I have called the patient and advised her of this. She reports [facility] has contacted her and arranged for an infusion on 09/25/2018. The patient still does not know if she is being charged for the drug or just the infusion. She will investigate this and update me once she knows.  Dr Havery Moros is it okay if the drug is shipped to [place] and the patient picks it up from [place] to take to her infusion? She is certain this is the same program she was enrolled in last year. Unfortunately I have not been successful in verifying this.

## 2018-09-20 NOTE — Telephone Encounter (Signed)
Jasmine calling from [facility] states they are needing to speak with a nurse  regarding patient assistance program for [employer]. Phone # [phone].

## 2018-09-21 NOTE — Telephone Encounter (Signed)
Yes I think that is fine, thanks for your help with this.

## 2018-09-22 ENCOUNTER — Telehealth: Payer: Self-pay | Admitting: Gastroenterology

## 2018-09-22 NOTE — Telephone Encounter (Signed)
[name] from [employer].

## 2018-09-24 ENCOUNTER — Other Ambulatory Visit: Payer: Self-pay | Admitting: Family Medicine

## 2018-09-25 NOTE — Telephone Encounter (Signed)
No phone number listed to call back.

## 2018-09-26 ENCOUNTER — Telehealth: Payer: Self-pay | Admitting: Gastroenterology

## 2018-09-26 DIAGNOSIS — K5100 Ulcerative (chronic) pancolitis without complications: Secondary | ICD-10-CM | POA: Diagnosis not present

## 2018-09-26 NOTE — Telephone Encounter (Signed)
rx verified with [facility].  They will call and verify where rx is being sent when it is ready top be shipped

## 2018-09-26 NOTE — Telephone Encounter (Signed)
[facility] Calling to get a verbal consent to refill Entyvio. The order they received was from a 3rd party.

## 2018-10-12 ENCOUNTER — Encounter: Payer: Self-pay | Admitting: Gynecology

## 2018-10-12 DIAGNOSIS — N6321 Unspecified lump in the left breast, upper outer quadrant: Secondary | ICD-10-CM | POA: Diagnosis not present

## 2018-10-13 ENCOUNTER — Telehealth: Payer: Self-pay | Admitting: Gastroenterology

## 2018-10-13 ENCOUNTER — Other Ambulatory Visit: Payer: Self-pay | Admitting: Gastroenterology

## 2018-10-13 DIAGNOSIS — K91850 Pouchitis: Secondary | ICD-10-CM

## 2018-10-13 MED ORDER — TINIDAZOLE 500 MG PO TABS: 500.0000 mg | ORAL_TABLET | Freq: Two times a day (BID) | ORAL | 1 refills | Status: DC

## 2018-10-13 NOTE — Telephone Encounter (Signed)
Left message on machine to call back  

## 2018-10-13 NOTE — Telephone Encounter (Signed)
The pt states she has "symptoms of infection of the J pouch" has history of pouchitis  and states Dr Havery Moros prescribes antibiotics.  She states she has diarrhea about every 45 min and her rectum is red and raw.  Please advise

## 2018-10-13 NOTE — Telephone Encounter (Signed)
[name] has Dr. Havery Moros this week.

## 2018-10-13 NOTE — Telephone Encounter (Signed)
PT states that she needs some antibiotics for an infection in her J pouch. JG

## 2018-10-13 NOTE — Telephone Encounter (Signed)
  I called patient and no answer. She has responded to tinidazole in the past, will try that. I wrote her for 500mg  BID for one month [facility]. I called and no answer. Will send her a Mychart message.   Chong Sicilian can you call her Monday to ensure she received this okay. Thanks

## 2018-10-16 NOTE — Telephone Encounter (Signed)
The pt did receive her medication and states she is doing very well.

## 2018-10-17 DIAGNOSIS — H25813 Combined forms of age-related cataract, bilateral: Secondary | ICD-10-CM | POA: Diagnosis not present

## 2018-10-17 DIAGNOSIS — H43812 Vitreous degeneration, left eye: Secondary | ICD-10-CM | POA: Diagnosis not present

## 2018-10-25 DIAGNOSIS — K5100 Ulcerative (chronic) pancolitis without complications: Secondary | ICD-10-CM | POA: Diagnosis not present

## 2018-11-07 ENCOUNTER — Other Ambulatory Visit: Payer: Self-pay | Admitting: Family Medicine

## 2018-11-15 ENCOUNTER — Ambulatory Visit: Payer: Medicare HMO | Admitting: Family

## 2018-11-15 ENCOUNTER — Other Ambulatory Visit: Payer: Medicare HMO

## 2018-11-20 DIAGNOSIS — N179 Acute kidney failure, unspecified: Secondary | ICD-10-CM | POA: Diagnosis not present

## 2018-11-20 DIAGNOSIS — R69 Illness, unspecified: Secondary | ICD-10-CM | POA: Diagnosis not present

## 2018-11-20 DIAGNOSIS — Z6831 Body mass index (BMI) 31.0-31.9, adult: Secondary | ICD-10-CM | POA: Diagnosis not present

## 2018-11-20 DIAGNOSIS — M109 Gout, unspecified: Secondary | ICD-10-CM | POA: Diagnosis not present

## 2018-11-20 DIAGNOSIS — N189 Chronic kidney disease, unspecified: Secondary | ICD-10-CM | POA: Diagnosis not present

## 2018-11-20 DIAGNOSIS — E876 Hypokalemia: Secondary | ICD-10-CM | POA: Diagnosis not present

## 2018-11-20 DIAGNOSIS — N183 Chronic kidney disease, stage 3 (moderate): Secondary | ICD-10-CM | POA: Diagnosis not present

## 2018-11-20 DIAGNOSIS — I129 Hypertensive chronic kidney disease with stage 1 through stage 4 chronic kidney disease, or unspecified chronic kidney disease: Secondary | ICD-10-CM | POA: Diagnosis not present

## 2018-11-20 DIAGNOSIS — D631 Anemia in chronic kidney disease: Secondary | ICD-10-CM | POA: Diagnosis not present

## 2018-11-20 DIAGNOSIS — D509 Iron deficiency anemia, unspecified: Secondary | ICD-10-CM | POA: Diagnosis not present

## 2018-11-20 DIAGNOSIS — R768 Other specified abnormal immunological findings in serum: Secondary | ICD-10-CM | POA: Diagnosis not present

## 2018-11-21 ENCOUNTER — Telehealth: Payer: Self-pay | Admitting: Family

## 2018-11-21 NOTE — Telephone Encounter (Signed)
Called and LMVM for patient regarding appointments that have been rs/ as requested/ Letter/Calendar mailed per 3/17 staff mesasge

## 2018-11-22 ENCOUNTER — Inpatient Hospital Stay: Payer: Medicare HMO | Admitting: Family

## 2018-11-22 ENCOUNTER — Inpatient Hospital Stay: Payer: Medicare HMO

## 2018-11-22 DIAGNOSIS — K5100 Ulcerative (chronic) pancolitis without complications: Secondary | ICD-10-CM | POA: Diagnosis not present

## 2018-11-30 ENCOUNTER — Other Ambulatory Visit: Payer: Self-pay | Admitting: Family Medicine

## 2018-11-30 ENCOUNTER — Other Ambulatory Visit (HOSPITAL_COMMUNITY): Payer: Self-pay | Admitting: *Deleted

## 2018-12-01 ENCOUNTER — Inpatient Hospital Stay (HOSPITAL_COMMUNITY): Admission: RE | Admit: 2018-12-01 | Payer: Medicare HMO | Source: Ambulatory Visit

## 2018-12-08 ENCOUNTER — Encounter (HOSPITAL_COMMUNITY)
Admission: RE | Admit: 2018-12-08 | Discharge: 2018-12-08 | Disposition: A | Discharge status: Home / Self Care | Payer: Medicare Other | Source: Ambulatory Visit | Attending: Nephrology | Admitting: Nephrology

## 2018-12-08 ENCOUNTER — Other Ambulatory Visit: Payer: Self-pay

## 2018-12-08 ENCOUNTER — Encounter (HOSPITAL_COMMUNITY): Payer: Self-pay

## 2018-12-08 DIAGNOSIS — D631 Anemia in chronic kidney disease: Secondary | ICD-10-CM | POA: Diagnosis not present

## 2018-12-08 DIAGNOSIS — N189 Chronic kidney disease, unspecified: Secondary | ICD-10-CM | POA: Diagnosis not present

## 2018-12-08 MED ORDER — SODIUM CHLORIDE 0.9 % IV SOLN
510.0000 mg | INTRAVENOUS | Status: DC
  Administered 2018-12-08: 510 mg via INTRAVENOUS
  Filled 2018-12-08: qty 510

## 2018-12-14 ENCOUNTER — Encounter (HOSPITAL_COMMUNITY)
Admission: RE | Admit: 2018-12-14 | Discharge: 2018-12-14 | Disposition: A | Discharge status: Home / Self Care | Payer: Medicare Other | Source: Ambulatory Visit | Attending: Nephrology | Admitting: Nephrology

## 2018-12-14 ENCOUNTER — Other Ambulatory Visit: Payer: Self-pay

## 2018-12-14 DIAGNOSIS — N189 Chronic kidney disease, unspecified: Secondary | ICD-10-CM | POA: Diagnosis not present

## 2018-12-14 DIAGNOSIS — D631 Anemia in chronic kidney disease: Secondary | ICD-10-CM | POA: Diagnosis not present

## 2018-12-14 MED ORDER — SODIUM CHLORIDE 0.9 % IV SOLN
510.0000 mg | INTRAVENOUS | Status: DC
  Administered 2018-12-14: 510 mg via INTRAVENOUS
  Filled 2018-12-14: qty 510

## 2018-12-22 ENCOUNTER — Ambulatory Visit: Payer: Medicare HMO | Admitting: Family

## 2018-12-22 ENCOUNTER — Other Ambulatory Visit: Payer: Medicare HMO

## 2018-12-25 ENCOUNTER — Other Ambulatory Visit: Payer: Self-pay | Admitting: Family Medicine

## 2018-12-26 ENCOUNTER — Other Ambulatory Visit: Payer: Self-pay | Admitting: Family Medicine

## 2018-12-26 DIAGNOSIS — M10071 Idiopathic gout, right ankle and foot: Secondary | ICD-10-CM

## 2018-12-26 NOTE — Telephone Encounter (Signed)
Requested medication (s) are due for refill today:  Yes  Requested medication (s) are on the active medication list:  yes  Future visit scheduled:  no  Last Refill: 04/12/18; #90; no refills  Note to clinic- attempted to call pt. To obtain information about her pain; left vm to call back to discuss.  Forwarding on the Dr. Carollee Herter for review/ recommendation.    Requested Prescriptions  Pending Prescriptions Disp Refills   HYDROcodone-acetaminophen (NORCO/VICODIN) 5-325 MG tablet 90 tablet 0    Sig: Take 2 tablets by mouth every 4 (four) hours as needed for moderate pain or severe pain.     Not Delegated - Analgesics:  Opioid Agonist Combinations Failed - 12/26/2018 11:09 AM      Failed - This refill cannot be delegated      Failed - Valid encounter within last 6 months    Recent Outpatient Visits          7 months ago Chronic pain of right knee   Archivist at [place] [facility], DO   8 months ago Pes anserine bursitis   Archivist at [facility], [place], [place]   11 months ago Urinary tract infection with hematuria, site unspecified   Archivist at [place], [place]   11 months ago Urine frequency   Archivist at [place], PA-C   1 year ago Acute right-sided low back pain with right-sided sciatica   Archivist at [place], DO      Future Appointments            In 5 months Vevelyn Royals, Parthenia Ames, [place] at [place] completed in last 360 days.

## 2018-12-26 NOTE — Telephone Encounter (Signed)
Attempted to call pt. To discuss where her pain is, in response to request for refill on Hydrocodone-Acetaminophen.  Left vm to call office to discuss this with a nurse.  Will forward request on to PCP.

## 2018-12-26 NOTE — Telephone Encounter (Signed)
Pt stated that she takes med at night to help slow motility and prevent her having to go to the bathroom all night. Pt stated that she does not take every night. Pt stated that due to her anxiety related to Coronavirus. She stated that she has ulcerative colitis  Pt stated she is willing to try another medication that could help her stomach and intestines from rumbling from hypermotility. She stated that the pain med has been beneficial.

## 2018-12-27 ENCOUNTER — Ambulatory Visit (INDEPENDENT_AMBULATORY_CARE_PROVIDER_SITE_OTHER): Payer: Medicare Other | Admitting: Family Medicine

## 2018-12-27 ENCOUNTER — Other Ambulatory Visit: Payer: Self-pay

## 2018-12-27 ENCOUNTER — Telehealth: Payer: Self-pay

## 2018-12-27 ENCOUNTER — Encounter: Payer: Self-pay | Admitting: Family Medicine

## 2018-12-27 DIAGNOSIS — K50919 Crohn's disease, unspecified, with unspecified complications: Secondary | ICD-10-CM

## 2018-12-27 DIAGNOSIS — F411 Generalized anxiety disorder: Secondary | ICD-10-CM

## 2018-12-27 DIAGNOSIS — K5100 Ulcerative (chronic) pancolitis without complications: Secondary | ICD-10-CM

## 2018-12-27 DIAGNOSIS — K91850 Pouchitis: Secondary | ICD-10-CM

## 2018-12-27 NOTE — Telephone Encounter (Signed)
appt scheduled

## 2018-12-27 NOTE — Progress Notes (Signed)
  Virtual Visit via Video Note  I connected with [name] on 12/27/18 at  1:30 PM EDT by a video enabled telemedicine application and verified that I am speaking with the correct person using two identifiers.   I discussed the limitations of evaluation and management by telemedicine and the availability of in person appointments. The patient expressed understanding and agreed to proceed.  History of Present Illness: Pt is at home outside with her chickens.  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     She states she had diarrhea every night and takes the norco to stop it and help her anxiety.      Observations/Objective: Unable to get vitals due to virtual visit and pt was outside Pt in NAD  No sob   Assessment and Plan: ..Marland Kitchen. 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[place] Per gi  2. Crohn's disease with complication, unspecified gastrointestinal tract location ([facility] D/w pt that norco was not to be used for diarrhea or anxiety I will discuss with the pt and one of uKoreawill get back to her    3 Anxiety state On cymbalta Pt does not want to change this [name] now She would like to get the crohn's/ diarrhea figured out first  I agreed with her  She will f/u up with uKoreaafter GI if she wishes to work on changing her anxiety meds    Follow Up Instructions:    I discussed the assessment and treatment plan with the patient. The patient was provided an opportunity to ask questions and all were answered. The patient agreed with the plan and demonstrated an  understanding of the instructions.   The patient was advised to call back or seek an in-person evaluation if the symptoms worsen or if the condition fails to improve as anticipated.  I provided 30 minutes of non-face-to-face time during this encounter.   YAnn Held DO

## 2018-12-27 NOTE — Telephone Encounter (Signed)
Called and scheduled pt for a Doximity call next week

## 2018-12-27 NOTE — Telephone Encounter (Signed)
-----   Message from Yetta Flock, MD sent at 12/27/2018  2:12 PM EDT ----- Jan can you let this patient know I got a message from Dr. Etter Sjogren and I would like to see this patient in clinic if she can do a virtual visit next week. Thanks  ----- Message ----- From: Ann Held, DO Sent: 12/27/2018   1:37 PM EDT To: Yetta Flock, MD  The pt called today asking for a refill on her norco+ because it keeps her intestines from rolling and having so much diarrhea.  I did not realize she had started using it for this and told her she probably needed to f/u with you and let you know she is still having trouble with her "intestines rolling"--- and loose stools .    I offered bentyl but she wanted to wait to see what you said .   I appreciate your input   Thanks    Kendrick Fries

## 2019-01-03 ENCOUNTER — Other Ambulatory Visit: Payer: Self-pay | Admitting: Family Medicine

## 2019-01-03 ENCOUNTER — Other Ambulatory Visit: Payer: Self-pay

## 2019-01-03 ENCOUNTER — Ambulatory Visit (INDEPENDENT_AMBULATORY_CARE_PROVIDER_SITE_OTHER): Payer: Medicare Other | Admitting: Gastroenterology

## 2019-01-03 VITALS — Wt 190.0 lb

## 2019-01-03 DIAGNOSIS — K50019 Crohn's disease of small intestine with unspecified complications: Secondary | ICD-10-CM

## 2019-01-03 DIAGNOSIS — F32A Depression, unspecified: Secondary | ICD-10-CM

## 2019-01-03 DIAGNOSIS — F329 Major depressive disorder, single episode, unspecified: Secondary | ICD-10-CM

## 2019-01-03 DIAGNOSIS — K91850 Pouchitis: Secondary | ICD-10-CM | POA: Diagnosis not present

## 2019-01-03 DIAGNOSIS — K219 Gastro-esophageal reflux disease without esophagitis: Secondary | ICD-10-CM | POA: Diagnosis not present

## 2019-01-03 MED ORDER — FAMOTIDINE 20 MG PO TABS: 20.0000 mg | ORAL_TABLET | Freq: Two times a day (BID) | ORAL | 3 refills | Status: DC

## 2019-01-03 MED ORDER — LOPERAMIDE HCL 2 MG PO TABS: 1.0000 mg | ORAL_TABLET | Freq: Every day | ORAL | 3 refills | Status: DC

## 2019-01-03 NOTE — Progress Notes (Addendum)
THIS ENCOUNTER IS A VIRTUAL VISIT DUE TO COVID-19 - PATIENT WAS NOT SEEN IN THE OFFICE. PATIENT HAS CONSENTED TO VIRTUAL VISIT / TELEMEDICINE VISIT. WE ATTEMPTED TO USE DOXIMITY APP HOWEVER PATIENT COULD NOT GET IT TO WORK, WE PROCEEDED WITH PHONE / AUDIO ONLY  Location of patient: home Location of provider: office Persons participating: myself, patient Total time spent 25 minutes  HPI :  IBD history: 63 year old white female with IBD ( Crohn's) since 69. 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 She was never able to be put in remission prior to colectomy. She was only exposed to steroids prior to her surgery. Post-operatively, she was on Remicade for 3-4 years but did not help too much. She had been on 6MP remotely but did not work for her. She was on Humira but also did not put her into remission and she developed high antibody titers and it was stopped. Most recentlyon Entyvio and low dose 6MP. 6MP was stopped due to worsening anemia.  She reports multiple courses of antibiotics with some benefit historically. She has been on budesonider with ? Relief. Father and daughter have UC. She has seen Jeanne Ivan at [facility] previously for second opinion.  SINCE THE LAST VISIT  63 y/o female here for a follow up visit. She's been doing okay since I have last seen her. She has remained on Entyvio q 4 week dosing (increased from previous when she had a low Entyvio level), she thinks it has helped her since she has been on it, has not had any significant flares of her Crohn's in recent years. Continues to be able.  She had asked for a refill of hydrocodone, I have not given this to her in the past - she thinks it helps slow her down her stools, not taking for pain. She is having on average about 5-6 BMs on a good day, this has been stable since last visit. She had been on rifaximin in the past after our last visit, did not help too much. Tinidazole helped her more  than other antibiotics, she has had a few courses of that as needed. She has rare blood but not significant. Sometimes she has hemorrhoids which bother her. She is not on any antibiotics right now, last was a few months ago. She had 90 tabs of hydrocodone , last filled in August. She has used immodium in the past which seems to be too strong and she thought constipated her at regular dosing. She is not having much abdominal pains routinely, sometimes in the LLQ. Her main complaint is periodic loose stools with irritation there. She has required some IV iron for history of iron deficiency, last Hgb stable as below.  She was taking Zantac for reflux historically but stopped it, previously on protonix but stopped it as she did not think it helped and she has CKD. She is using TUMS PRN, using 1 bottle per month. Having some reflux symptoms which bother her fairly frequently. Last EGD 11/16/2002, she had mild esophagitis. No FH of esophageal cancer. No dysphagia.   Pouchoscopy 03/31/17 - anal fissure, mild anal canal stenosis, focal ulceration of pouch and more proximal ileum but improved compared to previous    Past Medical History:  Diagnosis Date  . Abdominal pain    Hx  . Allergy   . Anal stenosis   . Anemia   . Anxiety   . Arthritis   . Asthma  patient does not have inhaler  . Blood in stool    Hx  . Blood in urine    Hx  . Blood transfusion without reported diagnosis   . De Quervain's tenosynovitis   . Depression   . Difficulty urinating    Hx  . Easy bruising   . Esophagitis   . Fainting    History - resolved - due to dehydration  . Fatigue    Hx  . Fibroid    Hx  . Gastric polyp   . GERD (gastroesophageal reflux disease)   . 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 Hemorrhoids, external   . Hemorrhoids, internal   . Herpes, genital    vaginal treated 07/05/12 and pt states is resolved  . History of cervical dysplasia   . History of small bowel  obstruction   . Hyperlipidemia    currently no meds  . Hyperparathyroidism   . Hypertension   . Hypokalemia    Hx  . 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 Obesity   . Ovarian cyst   . Poor dental hygiene   . Pulmonary nodule, right    right upper lobe  . Rectal bleeding    Hx  . Rectal pain    Hx  . Renal insufficiency    CKD - stage 2  . RLS (restless legs syndrome)    no meds  . Tooth infection 11/2016   right low  . Ulcerative colitis   . Visual disturbance    wears glasses  . Weakness generalized    Hx - patient denies generalized weakness  . Wears dentures    upper only     Past Surgical History:  Procedure Laterality Date  . ANAL DILATION    . CERVICAL BIOPSY  W/ LOOP ELECTRODE EXCISION    . CHOLECYSTECTOMY    . COLONOSCOPY     [name]  . fatty tumor removed from back     X 2  . HEMORRHOID SURGERY    . ILEOSTOMY CLOSURE    . RESTORATIVE PROCTOCOLECTOMY     with insertion of ileoanal J Pouch with loop ileostomy  . SIGMOIDOSCOPY    . TOTAL ABDOMINAL HYSTERECTOMY  1998   TAH/LSO  . TUBAL LIGATION    . UPPER GASTROINTESTINAL ENDOSCOPY     [name]   Family History  Problem Relation Age of Onset  . Ulcerative colitis Father   . Hypertension Father   . Heart attack Father   . Hypertension Mother   . Heart disease Mother        s/p pci  . Ulcerative colitis Daughter   . Irritable bowel syndrome Other        grandchildren  . Diabetes Sister   . Cancer Sister        uterine  . Cancer Maternal Uncle        LUNG  . Colon cancer Neg Hx   . Esophageal cancer Neg Hx   . Stomach cancer Neg Hx   . Rectal cancer Neg Hx    Social History   Tobacco Use  . Smoking status: Former Smoker    Packs/day: 1.00    Years: 4.00    Pack years: 4.00    Types: Cigarettes    Last attempt to quit: 05/21/1979    Years since quitting: 39.6  .  Smokeless tobacco: Never Used   Substance Use Topics  . Alcohol use: No    Alcohol/week: 0.0 standard drinks  . Drug use: No   Current Outpatient Medications  Medication Sig Dispense Refill  . allopurinol (ZYLOPRIM) 300 MG tablet TAKE 1 TABLET BY MOUTH EVERY DAY 30 tablet 0  . AMBULATORY NON FORMULARY MEDICATION Medication Name: Nitroglycerine ointment 0.125 %  Apply a pea sized amount internally four times daily. Dispense 30 GM zero refill (Patient taking differently: Medication Name: Nitroglycerine ointment 0.125 %  Apply a pea sized amount internally four times daily-PRN Dispense 30 GM zero refill) 30 g 3  . BIOTIN PO Take 1 tablet by mouth daily.     . Cholecalciferol (VITAMIN D-3) 5000 units TABS Take 1 tablet by mouth daily.    . DULoxetine (CYMBALTA) 60 MG capsule TAKE 2 CAPSULES BY MOUTH  DAILY 180 capsule 1  . Ergocalciferol (VITAMIN D2) 2000 units TABS Take by mouth daily.    . folic acid (FOLVITE) 1 MG tablet Take 2 tablets (2 mg total) by mouth daily. 180 tablet 3  . hydrALAZINE (APRESOLINE) 10 MG tablet Take 10 mg by mouth 3 (three) times daily.    Marland Kitchen HYDROcodone-acetaminophen (NORCO/VICODIN) 5-325 MG tablet Take 2 tablets by mouth every 4 (four) hours as needed for moderate pain or severe pain. 90 tablet 0  . Melatonin 5 MG TABS Take 5 mg at bedtime by mouth.    . metoprolol succinate (TOPROL-XL) 100 MG 24 hr tablet Take 100 mg by mouth daily.     . ranitidine (ZANTAC) 150 MG tablet TAKE 1 TABLET BY MOUTH TWO  TIMES DAILY 180 tablet 1  . rifaximin (XIFAXAN) 550 MG TABS tablet Take 1 tablet (550 mg total) by mouth 2 (two) times daily. (Patient not taking: Reported on 09/13/2018) 28 tablet 1  . Simethicone (GAS-X PO) Take 1 tablet by mouth at bedtime.     . sodium chloride 0.9 % SOLN 250 mL with vedolizumab 300 MG SOLR 300 mg Inject 300 mg into the vein every 30 (thirty) days. 300 mg 12  . tinidazole (TINDAMAX) 500 MG tablet Take 1 tablet (500 mg total) by mouth 2 (two) times daily. 60 tablet 1  . vedolizumab  (ENTYVIO) 300 MG injection Inject into the vein.    . vitamin B-12 (CYANOCOBALAMIN) 100 MCG tablet Take 100 mcg by mouth daily.      No current facility-administered medications for this visit.    Allergies  Allergen Reactions  . Sulfa Antibiotics Hives  . Morphine Other (See Comments)    Gives me crazy dreams Gives me crazy dreams  . Sulfonamide Derivatives Itching, Swelling and Rash     Review of Systems: All systems reviewed and negative except where noted in HPI.   Lab Results  Component Value Date   WBC 10.3 08/16/2018   HGB 11.5 (L) 08/16/2018   HCT 37.5 08/16/2018   MCV 104.5 (H) 08/16/2018   PLT 361 08/16/2018    Lab Results  Component Value Date   CREATININE 2.38 (H) 08/16/2018   BUN 33 (H) 08/16/2018   NA 139 08/16/2018   K 4.7 08/16/2018   CL 101 08/16/2018   CO2 29 08/16/2018    Lab Results  Component Value Date   ALT 12 08/16/2018   AST 15 08/16/2018   ALKPHOS 67 08/16/2018   BILITOT 0.3 08/16/2018     Physical Exam: Wt 190 lb (86.2 kg) Comment: pt provided over the phone  BMI 33.66 kg/m  NA   ASSESSMENT AND PLAN:  63 y/o female here for reassessment of the following:  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 She is on higher dose Entyvio given prior level of 4.8 without antibodies and pouchoscopy at the time showing mild inflammation. Higher-dose Entyvio has provided benefit to her, she has some periodic mild increase in stool frequency she has attributed to pouchitis in the past. We have used a variety of antibiotics in the past, tinidazole, flagyl, rifaximin, cipro, etc, she has responded fairly well to most of these (rifaximin not so much). I counseled her that chronic narcotic use is not a good long term strategy to manage her loose stools. I recommend she use a very low dose  of immodium, 1/2 tab q AM, and see how that does. Hopefully that provides some reduction in her frequency but does not constipate her. We will continue Entyvio as presently dosed. She has had recent labs per her Nephrologist which she says are stable. She is due for TB testing, will order quantiferon gold. Otherwise, follow up for reassessment in 6 months  GERD - having routine symptoms bothering her. She wants to avoid PPI due to CKD. Will try pepcid 20mg  BID and see if that helps. If symptoms persist may consider an EGD. She agreed.  [place] Cellar, MD [facility]

## 2019-01-03 NOTE — Patient Instructions (Addendum)
If you are age 65 or older, your body mass index should be between 23-30. Your Body mass index is 33.66 kg/m. If this is out of the aforementioned range listed, please consider follow up with your Primary Care Provider.  If you are age 11 or younger, your body mass index should be between 19-25. Your Body mass index is 33.66 kg/m. If this is out of the aformentioned range listed, please consider follow up with your Primary Care Provider.   We have sent the following medications to your pharmacy for you to pick up at your convenience: Loperamide 2mg : Take 1/2 tablet daily  Pepcid 20mg : Take twice a day  Discontinue Hydrocodone.  Thank you for entrusting me with your care and for choosing [facility], Dr. [place] Cellar  We would like to see you for a visit again in late August.  We will let you know when it is time to schedule.  Thank you for entrusting me with your care and for choosing [facility], Dr.  Cellar

## 2019-01-04 ENCOUNTER — Telehealth: Payer: Self-pay | Admitting: Family Medicine

## 2019-01-04 NOTE — Telephone Encounter (Signed)
Copied from [place] [id number]. Topic: Quick Communication - Rx Refill/Question >> Jan 04, 2019 11:25 AM Gustavus Messing wrote: Medication: DULoxetine (CYMBALTA) 60 MG capsule [[id number]]   Has the patient contacted their pharmacy? Yes.   (Agent: If yes, when and what did the pharmacy advise?) The pharmacy stated that the patient does not have any refills. She will be on her last pill on Saturday  Preferred Pharmacy (with phone number or street name): [id number] [phone](Phone) [phone](Fax)    Agent: Please be advised that RX refills may take up to 3 business days. We ask that you follow-up with your pharmacy.

## 2019-01-05 NOTE — Telephone Encounter (Signed)
rx sent in 

## 2019-01-23 ENCOUNTER — Other Ambulatory Visit: Payer: Self-pay | Admitting: Family Medicine

## 2019-02-02 DIAGNOSIS — H43812 Vitreous degeneration, left eye: Secondary | ICD-10-CM | POA: Diagnosis not present

## 2019-02-02 DIAGNOSIS — H2513 Age-related nuclear cataract, bilateral: Secondary | ICD-10-CM | POA: Diagnosis not present

## 2019-02-02 DIAGNOSIS — H25013 Cortical age-related cataract, bilateral: Secondary | ICD-10-CM | POA: Diagnosis not present

## 2019-02-17 ENCOUNTER — Other Ambulatory Visit: Payer: Self-pay | Admitting: Family Medicine

## 2019-02-28 ENCOUNTER — Telehealth: Payer: Self-pay | Admitting: Gastroenterology

## 2019-02-28 NOTE — Telephone Encounter (Signed)
Dr. Loni Muse - pt reports blood in her stool off and on for a couple of weeks. She reports Lower left sided pain and diarrhea for about one week. She thinks she probably has an infection of her Jpouch. Her last Entyvio infusion was on the 10th or 12th.  She wanted to see if she should go on an antibiotic? Please advise

## 2019-02-28 NOTE — Telephone Encounter (Signed)
Pt believes that her J pouch is infected.  She reported diarrhea, blood in stool and lower abd p.  She requested Dr. Havery Moros to prescribe some med for her.

## 2019-02-28 NOTE — Addendum Note (Signed)
Addended by: Yetta Flock on: 02/28/2019 02:40 PM   Modules accepted: Level of Service

## 2019-03-01 MED ORDER — METRONIDAZOLE 500 MG PO TABS: 500.0000 mg | ORAL_TABLET | Freq: Three times a day (TID) | ORAL | 0 refills | Status: AC

## 2019-03-01 NOTE — Telephone Encounter (Signed)
Sent script to pharmacy.  Called and left pt know. She expressed understanding.

## 2019-03-01 NOTE — Telephone Encounter (Signed)
Yes she has had pouchitis in the past and usually responds to antibiotics. Let's try flagyl 500mg  TID for 2 weeks and see if that helps her. Thanks

## 2019-03-01 NOTE — Addendum Note (Signed)
Addended by: Roetta Sessions on: 03/01/2019 08:34 AM   Modules accepted: Orders

## 2019-03-13 ENCOUNTER — Telehealth: Payer: Self-pay | Admitting: Gastroenterology

## 2019-03-13 NOTE — Telephone Encounter (Signed)
Patient called said that her insurance does not want to pay for her entyvio because she has not seen Dr. Havery Moros since 09-2018 but she has a virtual visit on 12-2018

## 2019-03-14 NOTE — Telephone Encounter (Signed)
Faxed 12-2018 office note to [place] at [phone].

## 2019-03-15 ENCOUNTER — Telehealth: Payer: Self-pay | Admitting: Gastroenterology

## 2019-03-15 NOTE — Telephone Encounter (Signed)
Patient and wanted to inform the doctor that her insurance is saying she needs and Authorization before she can get medication.

## 2019-03-15 NOTE — Telephone Encounter (Signed)
[employer] called said that [name] is denied

## 2019-03-16 NOTE — Telephone Encounter (Signed)
Called patient and says she changed form disability Medicare to [employer]. She has been getting home infusions of Entyvio and was told by her Ins. Co. That she needs authorization.Amy, can you please help with this ? Thanks

## 2019-03-16 NOTE — Telephone Encounter (Signed)
Received approval for Entyvio.

## 2019-03-16 NOTE — Telephone Encounter (Signed)
[name], If she has regular Medicare, it does not need a precert. If she has a Special educational needs teacher, it may.  She should make sure that we have the correct insurance cards on file to confirm that. I don't see a recent date that cards were scanned in.  Please reach out to Es for further assistance on this.  I'm streamlining things to her because I will be out of the office on vacation. Thanks

## 2019-03-16 NOTE — Telephone Encounter (Signed)
Called patient and let her know we need her to bring her current Ins. Card(s) into the office to be scanned, so Amy can work on the authorization.

## 2019-03-20 DIAGNOSIS — N2581 Secondary hyperparathyroidism of renal origin: Secondary | ICD-10-CM | POA: Diagnosis not present

## 2019-03-20 DIAGNOSIS — M109 Gout, unspecified: Secondary | ICD-10-CM | POA: Diagnosis not present

## 2019-03-20 DIAGNOSIS — F329 Major depressive disorder, single episode, unspecified: Secondary | ICD-10-CM | POA: Diagnosis not present

## 2019-03-20 DIAGNOSIS — E876 Hypokalemia: Secondary | ICD-10-CM | POA: Diagnosis not present

## 2019-03-20 DIAGNOSIS — D509 Iron deficiency anemia, unspecified: Secondary | ICD-10-CM | POA: Diagnosis not present

## 2019-03-20 DIAGNOSIS — N189 Chronic kidney disease, unspecified: Secondary | ICD-10-CM | POA: Diagnosis not present

## 2019-03-20 DIAGNOSIS — Z6831 Body mass index (BMI) 31.0-31.9, adult: Secondary | ICD-10-CM | POA: Diagnosis not present

## 2019-03-20 DIAGNOSIS — R768 Other specified abnormal immunological findings in serum: Secondary | ICD-10-CM | POA: Diagnosis not present

## 2019-03-20 DIAGNOSIS — N183 Chronic kidney disease, stage 3 (moderate): Secondary | ICD-10-CM | POA: Diagnosis not present

## 2019-03-20 DIAGNOSIS — D631 Anemia in chronic kidney disease: Secondary | ICD-10-CM | POA: Diagnosis not present

## 2019-03-20 DIAGNOSIS — I129 Hypertensive chronic kidney disease with stage 1 through stage 4 chronic kidney disease, or unspecified chronic kidney disease: Secondary | ICD-10-CM | POA: Diagnosis not present

## 2019-03-20 DIAGNOSIS — N179 Acute kidney failure, unspecified: Secondary | ICD-10-CM | POA: Diagnosis not present

## 2019-03-24 ENCOUNTER — Other Ambulatory Visit: Payer: Self-pay | Admitting: Family Medicine

## 2019-04-25 ENCOUNTER — Encounter: Payer: Self-pay | Admitting: Gastroenterology

## 2019-06-04 ENCOUNTER — Ambulatory Visit: Payer: Self-pay

## 2019-06-04 ENCOUNTER — Ambulatory Visit (INDEPENDENT_AMBULATORY_CARE_PROVIDER_SITE_OTHER): Payer: Medicare Other | Admitting: Family Medicine

## 2019-06-04 ENCOUNTER — Encounter: Payer: Self-pay | Admitting: Gastroenterology

## 2019-06-04 ENCOUNTER — Ambulatory Visit (INDEPENDENT_AMBULATORY_CARE_PROVIDER_SITE_OTHER): Payer: Medicare Other | Admitting: Gastroenterology

## 2019-06-04 ENCOUNTER — Other Ambulatory Visit: Payer: Self-pay

## 2019-06-04 ENCOUNTER — Ambulatory Visit (HOSPITAL_BASED_OUTPATIENT_CLINIC_OR_DEPARTMENT_OTHER)
Admission: RE | Admit: 2019-06-04 | Discharge: 2019-06-04 | Disposition: A | Discharge status: Home / Self Care | Payer: Medicare Other | Source: Ambulatory Visit | Attending: Family Medicine | Admitting: Family Medicine

## 2019-06-04 ENCOUNTER — Other Ambulatory Visit (INDEPENDENT_AMBULATORY_CARE_PROVIDER_SITE_OTHER): Payer: Medicare Other

## 2019-06-04 ENCOUNTER — Encounter: Payer: Self-pay | Admitting: Family Medicine

## 2019-06-04 VITALS — BP 158/105 | Ht 63.0 in | Wt 197.0 lb

## 2019-06-04 VITALS — BP 134/82 | HR 82 | Temp 98.3°F | Ht 63.0 in | Wt 197.0 lb

## 2019-06-04 DIAGNOSIS — S43005A Unspecified dislocation of left shoulder joint, initial encounter: Secondary | ICD-10-CM | POA: Diagnosis not present

## 2019-06-04 DIAGNOSIS — M25512 Pain in left shoulder: Secondary | ICD-10-CM

## 2019-06-04 DIAGNOSIS — K219 Gastro-esophageal reflux disease without esophagitis: Secondary | ICD-10-CM

## 2019-06-04 DIAGNOSIS — K50019 Crohn's disease of small intestine with unspecified complications: Secondary | ICD-10-CM

## 2019-06-04 DIAGNOSIS — K91850 Pouchitis: Secondary | ICD-10-CM

## 2019-06-04 DIAGNOSIS — Z23 Encounter for immunization: Secondary | ICD-10-CM | POA: Diagnosis not present

## 2019-06-04 LAB — COMPREHENSIVE METABOLIC PANEL
ALT: 13 U/L (ref 0–35)
AST: 15 U/L (ref 0–37)
Albumin: 4.1 g/dL (ref 3.5–5.2)
Alkaline Phosphatase: 87 U/L (ref 39–117)
BUN: 32 mg/dL — ABNORMAL HIGH (ref 6–23)
CO2: 25 mEq/L (ref 19–32)
Calcium: 9.6 mg/dL (ref 8.4–10.5)
Chloride: 103 mEq/L (ref 96–112)
Creatinine, Ser: 2.1 mg/dL — ABNORMAL HIGH (ref 0.40–1.20)
GFR: 23.6 mL/min — ABNORMAL LOW (ref >60.00)
Glucose, Bld: 94 mg/dL (ref 70–99)
Potassium: 3.8 mEq/L (ref 3.5–5.1)
Sodium: 137 mEq/L (ref 135–145)
Total Bilirubin: 0.4 mg/dL (ref 0.2–1.2)
Total Protein: 7.7 g/dL (ref 6.0–8.3)

## 2019-06-04 LAB — CBC WITH DIFFERENTIAL/PLATELET
Basophils Absolute: 0.1 10*3/uL (ref 0.0–0.1)
Basophils Relative: 0.5 % (ref 0.0–3.0)
Eosinophils Absolute: 0.3 10*3/uL (ref 0.0–0.7)
Eosinophils Relative: 3 % (ref 0.0–5.0)
HCT: 32.4 % — ABNORMAL LOW (ref 36.0–46.0)
Hemoglobin: 10.8 g/dL — ABNORMAL LOW (ref 12.0–15.0)
Lymphocytes Relative: 26.1 % (ref 12.0–46.0)
Lymphs Abs: 2.8 10*3/uL (ref 0.7–4.0)
MCHC: 33.4 g/dL (ref 30.0–36.0)
MCV: 99 fl (ref 78.0–100.0)
Monocytes Absolute: 1 10*3/uL (ref 0.1–1.0)
Monocytes Relative: 8.8 % (ref 3.0–12.0)
Neutro Abs: 6.7 10*3/uL (ref 1.4–7.7)
Neutrophils Relative %: 61.6 % (ref 43.0–77.0)
Platelets: 344 10*3/uL (ref 150.0–400.0)
RBC: 3.27 Mil/uL — ABNORMAL LOW (ref 3.87–5.11)
RDW: 14.2 % (ref 11.5–15.5)
WBC: 10.9 10*3/uL — ABNORMAL HIGH (ref 4.0–10.5)

## 2019-06-04 LAB — VITAMIN D 25 HYDROXY (VIT D DEFICIENCY, FRACTURES): VITD: 35.37 ng/mL (ref 30.00–100.00)

## 2019-06-04 IMAGING — DX DG SHOULDER 2+V*L*
2 series · 2 of 2 positions shown · non-contrast
Comparison: None.

CLINICAL DATA: Left shoulder pain, dislocation

EXAM:
LEFT SHOULDER - 2+ VIEW

[shoulder grashey]
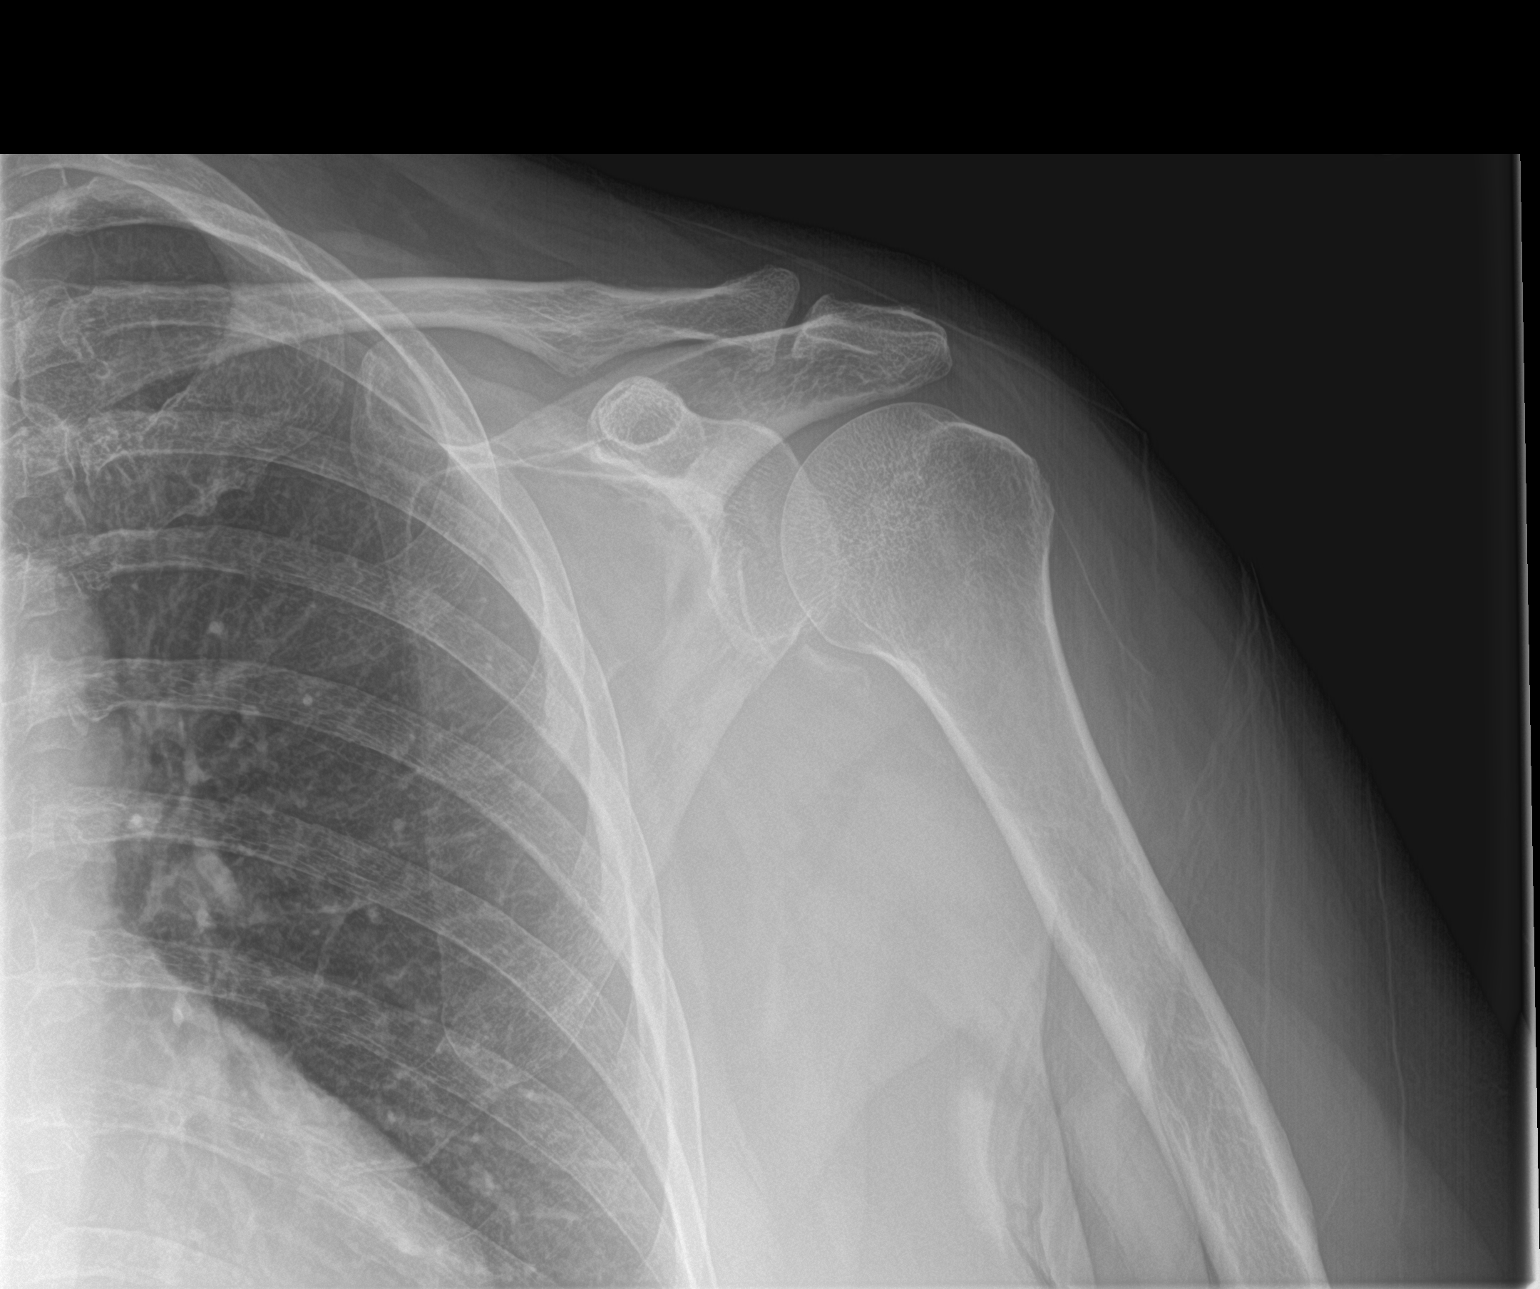

[shoulder y view]
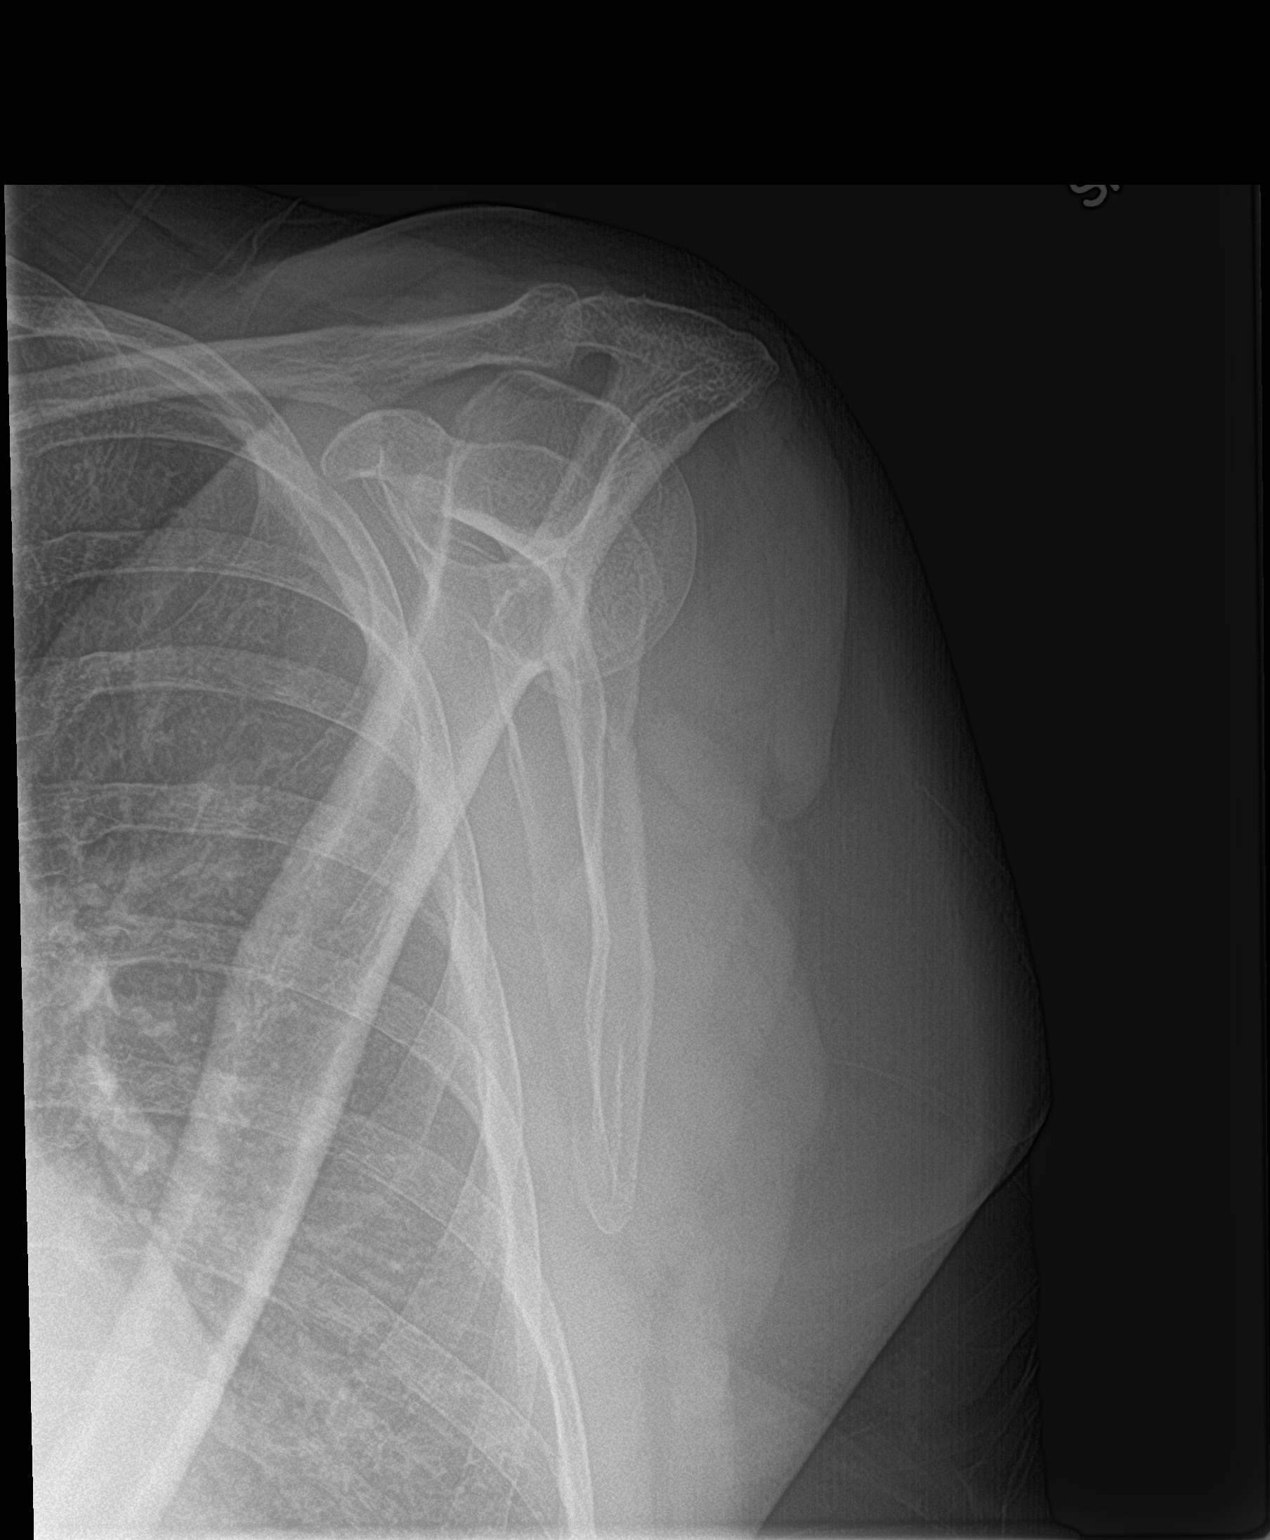

[2 of 2 positions shown; findings below may reference images not displayed]

FINDINGS: Suspect a small impaction fracture along the posterolateral humeral
head compatible with Hill-Sachs deformity. A small displaced os FX
fragment is seen along the inferior glenohumeral recess. Could
reflect a displaced fracture fragment from the glenoid such as with
a bony Bankart type lesion. There is mild swelling.
Acromioclavicular and coracoclavicular intervals are maintained.
Included portions of the left chest wall are unremarkable.
IMPRESSION: 1. Hill-Sachs deformity.
2. Ossific fragment along the inferior glenohumeral recess,
suspicious for a bony Bankart lesion.

## 2019-06-04 NOTE — Progress Notes (Signed)
[name] - 63 y.o. female MRN [id number]  Date of birth: [birth year]  SUBJECTIVE:  Including CC & ROS.  Chief Complaint  Patient presents with  . Shoulder Pain    left shoulder    [name] is a 63 y.o. female that is presenting with left shoulder pain.  She had a fall yesterday where she fell backwards and landing on her hand.  She felt her shoulder go up into the joint.  Since that time she has had significant pain and trouble moving it.  She is placed in a sling and this is helped her pain.  No prior surgery on the shoulder.  Pain is localized to the joint region.  Has had improvement today of her range of motion.  Denies any radicular symptoms.   Review of Systems  Constitutional: Negative for fever.  HENT: Negative for congestion.   Respiratory: Negative for cough.   Cardiovascular: Negative for chest pain.  Gastrointestinal: Negative for abdominal pain.  Musculoskeletal: Positive for joint swelling.  Skin: Negative for color change.  Neurological: Negative for weakness.  Hematological: Negative for adenopathy.    HISTORY: Past Medical, Surgical, Social, and Family History Reviewed & Updated per EMR.   Pertinent Historical Findings include:  Past Medical History:  Diagnosis Date  . Abdominal pain    Hx  . Allergy   . Anal stenosis   . Anemia   . Anxiety   . Arthritis   . Asthma    patient does not have inhaler  . Blood in stool    Hx  . Blood in urine    Hx  . Blood transfusion without reported diagnosis   . CKD (chronic kidney disease) stage 3, GFR 30-59 ml/min (HCC)   . Crohn's colitis, other complication ([place])   . De Quervain's tenosynovitis   . Depression   . Difficulty urinating    Hx  . Easy bruising   . Esophagitis   . Fainting    History - resolved - due to dehydration  . Fatigue    Hx  . Fibroid    Hx  . Gastric polyp   . GERD (gastroesophageal reflux disease)   . 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  Hemorrhoids, external   . Hemorrhoids, internal   . Herpes, genital    vaginal treated 07/05/12 and pt states is resolved  . History of cervical dysplasia   . History of small bowel obstruction   . Hyperlipidemia    currently no meds  . Hyperparathyroidism   . Hypertension   . Hypokalemia    Hx  . 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 Obesity   . Ovarian cyst   . Poor dental hygiene   . Pulmonary nodule, right    right upper lobe  . Rectal bleeding    Hx  . Rectal pain    Hx  . Renal insufficiency    CKD - stage 2  . RLS (restless legs syndrome)    no meds  . Tooth infection 11/2016   right low  . Ulcerative colitis   . Visual disturbance    wears glasses  . Weakness generalized    Hx - patient denies generalized weakness  . Wears dentures    upper only    Past Surgical History:  Procedure Laterality Date  .  ANAL DILATION    . CERVICAL BIOPSY  W/ LOOP ELECTRODE EXCISION    . CHOLECYSTECTOMY    . COLONOSCOPY     [name]  . fatty tumor removed from back     X 2  . HEMORRHOID SURGERY    . ILEOSTOMY CLOSURE    . RESTORATIVE PROCTOCOLECTOMY     with insertion of ileoanal J Pouch with loop ileostomy  . SIGMOIDOSCOPY    . TOTAL ABDOMINAL HYSTERECTOMY  1998   TAH/LSO  . TUBAL LIGATION    . UPPER GASTROINTESTINAL ENDOSCOPY     [name]    Allergies  Allergen Reactions  . Sulfa Antibiotics Hives  . Morphine Other (See Comments)    Gives me crazy dreams Gives me crazy dreams  . Sulfonamide Derivatives Itching, Swelling and Rash    Family History  Problem Relation Age of Onset  . Ulcerative colitis Father   . Hypertension Father   . Heart attack Father   . Hypertension Mother   . Heart disease Mother        s/p pci  . Ulcerative colitis Daughter   . Irritable bowel syndrome Other        grandchildren  . Diabetes Sister   . Cancer Sister        uterine  .  Cancer Maternal Uncle        LUNG  . Colon cancer Neg Hx   . Esophageal cancer Neg Hx   . Stomach cancer Neg Hx   . Rectal cancer Neg Hx      Social History   Socioeconomic History  . Marital status: Divorced    Spouse name: Not on file  . Number of children: Not on file  . Years of education: Not on file  . Highest education level: Not on file  Occupational History  . Not on file  Social Needs  . Financial resource strain: Not on file  . Food insecurity    Worry: Not on file    Inability: Not on file  . Transportation needs    Medical: Not on file    Non-medical: Not on file  Tobacco Use  . Smoking status: Former Smoker    Packs/day: 1.00    Years: 4.00    Pack years: 4.00    Types: Cigarettes    Quit date: 05/21/1979    Years since quitting: 40.0  . Smokeless tobacco: Never Used  Substance and Sexual Activity  . Alcohol use: No    Alcohol/week: 0.0 standard drinks  . Drug use: No  . Sexual activity: Yes    Birth control/protection: Post-menopausal    Comment: 1st intercourse 63 yo-Fewer than 5 partners  Lifestyle  . Physical activity    Days per week: Not on file    Minutes per session: Not on file  . Stress: Not on file  Relationships  . Social Herbalist on phone: Not on file    Gets together: Not on file    Attends religious service: Not on file    Active member of club or organization: Not on file    Attends meetings of clubs or organizations: Not on file    Relationship status: Not on file  . Intimate partner violence    Fear of current or ex partner: Not on file    Emotionally abused: Not on file    Physically abused: Not on file    Forced sexual activity: Not on file  Other Topics Concern  .  Not on file  Social History Narrative  . Not on file     PHYSICAL EXAM:  VS: BP (!) 158/105   Ht 5\' 3"  (1.6 m)   Wt 197 lb (89.4 kg)   BMI 34.90 kg/m  Physical Exam Gen: NAD, alert, cooperative with exam, well-appearing ENT: normal  lips, normal nasal mucosa,  Eye: normal EOM, normal conjunctiva and lids CV:  no edema, +2 pedal pulses   Resp: no accessory muscle use, non-labored,  GI: no masses or tenderness, no hernia  Skin: no rashes, no areas of induration  Neuro: normal tone, normal sensation to touch Psych:  normal insight, alert and oriented MSK:  Left shoulder: Passive flexion is normal.  Limited abduction passively. Pain with external rotation and abduction. Normal empty can testing. Normal Hawkins test. Normal grip strength. Neurovascular intact  Limited ultrasound: Left shoulder:  Biceps tendon is intact with encircling effusion. Normal subscapularis. Supraspinatus appears to be normal. Large effusion of the [facility] joint. There appears to be a hypoechoic change at the tip of the acromium. Effusion in the posterior glenohumeral joint.  There appears to be a change in the cortex of the humerus which could represent a fracture.  Summary: Joint effusion and possible humeral head fracture.  Ultrasound and interpretation by Clearance Coots, MD      ASSESSMENT & PLAN:   Acute pain of left shoulder Injury the result of the fall.  Rotator cuff appears to be intact.  Possible dislocation and spontaneous reduction.  Possible fracture. -Sling. -Counseled on home exercise therapy and supportive care. -X-ray. -Could consider injection

## 2019-06-04 NOTE — Progress Notes (Signed)
HPI :  IBD history: 63 year old white female with IBD ( Crohn's) since 29. 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 She was never able to be put in remission prior to colectomy. Shewasonly exposed to steroids prior to her surgery. Post-operatively, she was on Remicade for 3-4 years but did not help too much. She had been on 6MP remotely but did not work for her. She was on Humira but also did not put her into remission and she developed high antibody titers and it was stopped. Most recentlyon Entyvio and low dose 6MP. 6MP was stopped due to worsening anemia.She reports multiple courses of antibiotics with some benefit historically. She has been on budesonider with ? Relief. Father and daughter have UC. She has seen Jeanne Ivan at [facility] previously for second opinion.  SINCE THE LAST VISIT  63 year old female here for follow-up visit.  Since of last seen her she has been maintained on Entyvio every 4 weeks. Managed with Entyvio monotherapy every four-week dosing, 6-MP was previously stopped due to worsening anemia although was not certain if this was related. She is on higher dose Entyvio given prior level of 4.8 without antibodies and pouchoscopy at the time showing mild inflammation.  She has been given periodic antibiotics for flares of pouchitis in the past.  Most recently she was on Flagyl back in June for a mild flare in symptoms.  In general she states she has been doing as well as she has ever been in regards to these issues.  She has occasional loose stools at times with gas and scant bleeding.  She states she has been having bleeding for more than 20 years.  Generally no significant flares of her disease since have seen her, no hospitalizations.  Symptoms are stable.  She takes 1/2 tablet Imodium nightly.  She denies any abdominal pains or perianal pain.  Tinidazole has been listed in her medication list however she has not been taking this at all.  She  is due for a flu shot but prefers to wait till October to get this done.  She is overdue for a PCV 13 vaccine and Shingrix vaccine.. She is due for QuantiFERON gold testing.  Of note she has home Entyvio infusions and this has worked quite well for her.  Otherwise she continues to take Pepcid twice a day for reflux as well as Tums.  She has been avoiding PPI given her CKD.  She is followed by nephrologist closely for this.  Her last EGD was around 2003 which showed mild esophagitis.  She inquires about having a follow-up endoscopy to reassess this issue.  Her reflux has been bothering her on a daily basis but at times appears to take the edge off when she takes it.  Of note the patient had a fall yesterday and injured her left shoulder.  She states she slipped and had a traumatic injury to left shoulder.  She states the shoulder has been "popping in and out of the socket" since that time.  She has immobilized with a sling but has not sought care for it yet.  When at rest she has no pain, however she has pain with movement.  Pouchoscopy 03/31/17 - anal fissure, mild anal canal stenosis, focal ulceration of pouch and more proximal ileum but improved compared to previous    Past Medical History:  Diagnosis Date   Abdominal pain    Hx   Allergy    Anal  stenosis    Anemia    Anxiety    Arthritis    Asthma    patient does not have inhaler   Blood in stool    Hx   Blood in urine    Hx   Blood transfusion without reported diagnosis    CKD (chronic kidney disease) stage 3, GFR 30-59 ml/min (HCC)    Crohn's colitis, other complication (HCC)    De Quervain's tenosynovitis    Depression    Difficulty urinating    Hx   Easy bruising    Esophagitis    Fainting    History - resolved - due to dehydration   Fatigue    Hx   Fibroid    Hx   Gastric polyp    GERD (gastroesophageal reflux disease)    Hearing loss    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   Hemorrhoids,  external    Hemorrhoids, internal    Herpes, genital    vaginal treated 07/05/12 and pt states is resolved   History of cervical dysplasia    History of small bowel obstruction    Hyperlipidemia    currently no meds   Hyperparathyroidism    Hypertension    Hypokalemia    Hx   IBD (inflammatory bowel disease)    initially colectomy for suspected UC, 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   Obesity    Ovarian cyst    Poor dental hygiene    Pulmonary nodule, right    right upper lobe   Rectal bleeding    Hx   Rectal pain    Hx   Renal insufficiency    CKD - stage 2   RLS (restless legs syndrome)    no meds   Tooth infection 11/2016   right low   Ulcerative colitis    Visual disturbance    wears glasses   Weakness generalized    Hx - patient denies generalized weakness   Wears dentures    upper only     Past Surgical History:  Procedure Laterality Date   ANAL DILATION     CERVICAL BIOPSY  W/ LOOP ELECTRODE EXCISION     CHOLECYSTECTOMY     COLONOSCOPY     [name]   fatty tumor removed from back     X 2   HEMORRHOID SURGERY     ILEOSTOMY CLOSURE     RESTORATIVE PROCTOCOLECTOMY     with insertion of ileoanal J Pouch with loop ileostomy   SIGMOIDOSCOPY     TOTAL ABDOMINAL HYSTERECTOMY  1998   TAH/LSO   TUBAL LIGATION     UPPER GASTROINTESTINAL ENDOSCOPY     [name]   Family History  Problem Relation Age of Onset   Ulcerative colitis Father    Hypertension Father    Heart attack Father    Hypertension Mother    Heart disease Mother        s/p pci   Ulcerative colitis Daughter    Irritable bowel syndrome Other        grandchildren   Diabetes Sister    Cancer Sister        uterine   Cancer Maternal Uncle        LUNG   Colon cancer Neg Hx    Esophageal cancer Neg Hx    Stomach cancer Neg Hx    Rectal cancer Neg Hx    Social History   Tobacco Use   Smoking  status: Former Smoker     Packs/day: 1.00    Years: 4.00    Pack years: 4.00    Types: Cigarettes    Quit date: 05/21/1979    Years since quitting: 40.0   Smokeless tobacco: Never Used  Substance Use Topics   Alcohol use: No    Alcohol/week: 0.0 standard drinks   Drug use: No   Current Outpatient Medications  Medication Sig Dispense Refill   allopurinol (ZYLOPRIM) 300 MG tablet TAKE 1 TABLET BY MOUTH EVERY DAY 30 tablet 2   AMBULATORY NON FORMULARY MEDICATION Medication Name: Nitroglycerine ointment 0.125 %  Apply a pea sized amount internally four times daily. Dispense 30 GM zero refill (Patient taking differently: Medication Name: Nitroglycerine ointment 0.125 %  Apply a pea sized amount internally four times daily-PRN Dispense 30 GM zero refill) 30 g 3   BIOTIN PO Take 1 tablet by mouth daily.      Cholecalciferol (VITAMIN D-3) 5000 units TABS Take 1 tablet by mouth daily.     DULoxetine (CYMBALTA) 60 MG capsule TAKE 2 CAPSULES BY MOUTH EVERY DAY 180 capsule 1   Ergocalciferol (VITAMIN D2) 2000 units TABS Take by mouth every 30 (thirty) days. Patient does not know dosage     famotidine (PEPCID) 20 MG tablet Take 1 tablet (20 mg total) by mouth 2 (two) times daily. 180 tablet 3   hydrALAZINE (APRESOLINE) 10 MG tablet Take 10 mg by mouth 3 (three) times daily.     loperamide (LOPERAMIDE A-D) 2 MG tablet Take 0.5 tablets (1 mg total) by mouth daily. 180 tablet 3   metoprolol succinate (TOPROL-XL) 100 MG 24 hr tablet Take 100 mg by mouth daily.      Simethicone (GAS-X PO) Take 1 tablet by mouth at bedtime.      sodium chloride 0.9 % SOLN 250 mL with vedolizumab 300 MG SOLR 300 mg Inject 300 mg into the vein every 30 (thirty) days. 300 mg 12   vitamin B-12 (CYANOCOBALAMIN) 100 MCG tablet Take 100 mcg by mouth daily.      folic acid (FOLVITE) 1 MG tablet Take 2 tablets (2 mg total) by mouth daily. (Patient not taking: Reported on 06/04/2019) 180 tablet 3   tinidazole (TINDAMAX) 500 MG tablet Take  1 tablet (500 mg total) by mouth 2 (two) times daily. (Patient not taking: Reported on 06/04/2019) 60 tablet 1   No current facility-administered medications for this visit.    Allergies  Allergen Reactions   Sulfa Antibiotics Hives   Morphine Other (See Comments)    Gives me crazy dreams Gives me crazy dreams   Sulfonamide Derivatives Itching, Swelling and Rash     Review of Systems: All systems reviewed and negative except where noted in HPI.   Lab Results  Component Value Date   WBC 10.3 08/16/2018   HGB 11.5 (L) 08/16/2018   HCT 37.5 08/16/2018   MCV 104.5 (H) 08/16/2018   PLT 361 08/16/2018    Lab Results  Component Value Date   CREATININE 2.38 (H) 08/16/2018   BUN 33 (H) 08/16/2018   NA 139 08/16/2018   K 4.7 08/16/2018   CL 101 08/16/2018   CO2 29 08/16/2018    Lab Results  Component Value Date   ALT 12 08/16/2018   AST 15 08/16/2018   ALKPHOS 67 08/16/2018   BILITOT 0.3 08/16/2018     Physical Exam: BP 134/82    Pulse 82    Temp 98.3 F (36.8 C)  Ht 5' 3"  (1.6 m)    Wt 197 lb (89.4 kg)    BMI 34.90 kg/m  Constitutional: Pleasant,well-developed, female in no acute distress. HEENT: Normocephalic and atraumatic. Conjunctivae are normal. No scleral icterus. Neck supple.  Cardiovascular: Normal rate, regular rhythm.  Pulmonary/chest: Effort normal and breath sounds normal. No wheezing, rales or rhonchi. Abdominal: Soft, nondistended, nontender. There are no masses palpable.  Extremities: no edema. Left shoulder in sling with limited mobility, no pain to palpation Lymphadenopathy: No cervical adenopathy noted. Neurological: Alert and oriented to person place and time. Skin: Skin is warm and dry. No rashes noted. Psychiatric: Normal mood and affect. Behavior is normal.   ASSESSMENT AND PLAN: 63 year old female here for reassessment the following  Crohn's with complication / Ileal pouchitis - generally stable since ast seen her.  She is managed  with higher dose Entyvio dosing given prior level of 4.8 without antibodies and last pouchoscopy showing mild inflammation.  She responded well to higher dosing, significant flares of symptoms.  Periodically on 6-MP however stopped due to worsening anemia, unclear if this is related or not.  She has had a variety of antibiotics in the past to treat component of pouchitis to include tinidazole, flagyl, rifaximin, cipro, etc, she has responded fairly well to most of these when she is needed it.  Last given in June and very stable since that time.  Would prefer to not use chronic antibiotics for her if possible.  We will continue her regimen for now.  We discussed whether or not she wants a follow-up pouchoscopy to assess the pouch after change in her dosing.  She did want to proceed with this after discussion of risks and benefits.  She is otherwise due for a flu shot, PCV 13, Shingrix vaccine.  She is agreeable to PCV 13 today.  She is deferring the flu shot until October.  We will direct her where she can get the Shingrix vaccine.  She is due for basic labs today to include CBC, c-Met, vitamin D level, QuantiFERON gold.  Further recommendations pending her course and results of pouchoscopy.  GERD - mild esophagitis on remote EGD.  She has not been using PPI in light of her CKD.  On Pepcid 20 mg twice daily which does help somewhat however continues to use Tums fairly routinely.  I offered her an EGD to ensure no evidence of Barrett's esophagus and assess level of inflammation as we determine best long-term regimen for her in light of her CKD.  After discussion of risks and benefits she want to proceed.  Further recommendations pending results.  I offered her a trial of Carafate but she declined at this time.  Left shoulder pain - traumatic injury to the left shoulder yesterday, sounds like she dislocated it and it reduced.  Recommending she be seen by an orthopedist soon as possible given her immobility and  pain.  We will try to coordinate this for her today.  She agreed  Reno Cellar, MD [facility]

## 2019-06-04 NOTE — Patient Instructions (Signed)
Nice to meet you Please use the sling  Please try the range of motion exercises. Use ice afterwards  I will call you with the results from today  Please send me a message in MyChart with any questions or updates.  Please see me back in 4 weeks or sooner if needed.   --Dr. Raeford Razor

## 2019-06-04 NOTE — Assessment & Plan Note (Signed)
Injury the result of the fall.  Rotator cuff appears to be intact.  Possible dislocation and spontaneous reduction.  Possible fracture. -Sling. -Counseled on home exercise therapy and supportive care. -X-ray. -Could consider injection

## 2019-06-04 NOTE — Patient Instructions (Addendum)
If you are age 65 or older, your body mass index should be between 23-30. Your Body mass index is 34.9 kg/m. If this is out of the aforementioned range listed, please consider follow up with your Primary Care Provider.  If you are age 63 or younger, your body mass index should be between 19-25. Your Body mass index is 34.9 kg/m. If this is out of the aformentioned range listed, please consider follow up with your Primary Care Provider.   To help prevent the possible spread of infection to our patients, communities, and staff; we will be implementing the following measures:  As of now we are not allowing any visitors/family members to accompany you to any upcoming appointments with [facility]. If you have any concerns about this please contact our office to discuss prior to the appointment.   You have been scheduled for a EGD/pouchoscopy. Please follow written instructions given to you at your visit today.  Please pick up your prep supplies at the pharmacy within the next 1-3 days. If you use inhalers (even only as needed), please bring them with you on the day of your procedure.   Please go to the lab in the basement of our building to have lab work done as you leave today. Hit "B" for basement when you get on the elevator.  When the doors open the lab is on your left.  We will call you with the results. Thank you.   You are in need of a Shingrix vaccine to protect you from  Getting shingles.  You can call [facility] at [phone] to schedule an appointment for your first and second injection. You can also get the vaccine at several pharmacies but we advise you to call first to ensure availability.  We are giving you a Prevnar 13 vaccine today.   We have scheduled you an appointment with Dr. Patrica Duel today at 2.10 pm  Phone number is [phone]  [address], [place], [place] [address]  Thank you for entrusting me with your care and  for choosing [facility], Dr. Zarephath Cellar

## 2019-06-05 ENCOUNTER — Telehealth: Payer: Self-pay | Admitting: Family Medicine

## 2019-06-05 DIAGNOSIS — M25512 Pain in left shoulder: Secondary | ICD-10-CM

## 2019-06-05 NOTE — Progress Notes (Signed)
Virtual Visit via Video Note  I connected with patient on 06/07/19 at  8:00 AM EDT by audio enabled telemedicine application and verified that I am speaking with the correct person using two identifiers.   THIS ENCOUNTER IS A VIRTUAL VISIT DUE TO COVID-19 - PATIENT WAS NOT SEEN IN THE OFFICE. PATIENT HAS CONSENTED TO VIRTUAL VISIT / TELEMEDICINE VISIT   Location of patient: home  Location of provider: office  I discussed the limitations of evaluation and management by telemedicine and the availability of in person appointments. The patient expressed understanding and agreed to proceed.   Subjective:   [name] is a 63 y.o. female who presents for Medicare Annual (Subsequent) preventive examination.  Enjoys doing color by number on cell phone. Cares for her 64 yo mother. Has 3 dogs and 1 rabbit.   Review of Systems:  Home Safety/Smoke Alarms: Feels safe in home. Smoke alarms in place.  Lives in 1 story home w/ boyfriend.   Female:      Mammo-  10/12/18     Dexa scan-   declines     [facility]- last reported 2018-w/ Dr.Armbruster     Objective:     Vitals: Wt 197 lb (89.4 kg)   BMI 34.90 kg/m   Body mass index is 34.9 kg/m.  Advanced Directives 08/16/2018 06/05/2018 05/23/2018 05/17/2018 03/16/2018 02/28/2018 01/12/2018  Does Patient Have a Medical Advance Directive? No No No No No No No  Would patient like information on creating a medical advance directive? No - Patient declined Yes (MAU/Ambulatory/Procedural Areas - Information given) - - - No - Patient declined -  Pre-existing out of facility DNR order (yellow form or pink MOST form) - - - - - - -    Tobacco Social History   Tobacco Use  Smoking Status Former Smoker  . Packs/day: 1.00  . Years: 4.00  . Pack years: 4.00  . Types: Cigarettes  . Quit date: 05/21/1979  . Years since quitting: 40.0  Smokeless Tobacco Never Used     Counseling given: Not Answered   Clinical Intake: Pain : No/denies pain    Past  Medical History:  Diagnosis Date  . Abdominal pain    Hx  . Allergy   . Anal stenosis   . Anemia   . Anxiety   . Arthritis   . Asthma    patient does not have inhaler  . Blood in stool    Hx  . Blood in urine    Hx  . Blood transfusion without reported diagnosis   . CKD (chronic kidney disease) stage 3, GFR 30-59 ml/min (HCC)   . Crohn's colitis, other complication ([place])   . De Quervain's tenosynovitis   . Depression   . Difficulty urinating    Hx  . Easy bruising   . Esophagitis   . Fainting    History - resolved - due to dehydration  . Fatigue    Hx  . Fibroid    Hx  . Gastric polyp   . GERD (gastroesophageal reflux disease)   . 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 Hemorrhoids, external   . Hemorrhoids, internal   . Herpes, genital    vaginal treated 07/05/12 and pt states is resolved  . History of cervical dysplasia   . History of small bowel obstruction   . Hyperlipidemia    currently no meds  . Hyperparathyroidism   . Hypertension   .  Hypokalemia    Hx  . 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 Obesity   . Ovarian cyst   . Poor dental hygiene   . Pulmonary nodule, right    right upper lobe  . Rectal bleeding    Hx  . Rectal pain    Hx  . Renal insufficiency    CKD - stage 2  . RLS (restless legs syndrome)    no meds  . Tooth infection 11/2016   right low  . Ulcerative colitis   . Visual disturbance    wears glasses  . Weakness generalized    Hx - patient denies generalized weakness  . Wears dentures    upper only   Past Surgical History:  Procedure Laterality Date  . ANAL DILATION    . CERVICAL BIOPSY  W/ LOOP ELECTRODE EXCISION    . CHOLECYSTECTOMY    . COLONOSCOPY     [name]  . fatty tumor removed from back     X 2  . HEMORRHOID SURGERY    . ILEOSTOMY CLOSURE    . RESTORATIVE PROCTOCOLECTOMY     with insertion of  ileoanal J Pouch with loop ileostomy  . SIGMOIDOSCOPY    . TOTAL ABDOMINAL HYSTERECTOMY  1998   TAH/LSO  . TUBAL LIGATION    . UPPER GASTROINTESTINAL ENDOSCOPY     [name]   Family History  Problem Relation Age of Onset  . Ulcerative colitis Father   . Hypertension Father   . Heart attack Father   . Hypertension Mother   . Heart disease Mother        s/p pci  . Ulcerative colitis Daughter   . Irritable bowel syndrome Other        grandchildren  . Diabetes Sister   . Cancer Sister        uterine  . Cancer Maternal Uncle        LUNG  . Colon cancer Neg Hx   . Esophageal cancer Neg Hx   . Stomach cancer Neg Hx   . Rectal cancer Neg Hx    Social History   Socioeconomic History  . Marital status: Divorced    Spouse name: Not on file  . Number of children: Not on file  . Years of education: Not on file  . Highest education level: Not on file  Occupational History  . Not on file  Social Needs  . Financial resource strain: Not on file  . Food insecurity    Worry: Not on file    Inability: Not on file  . Transportation needs    Medical: Not on file    Non-medical: Not on file  Tobacco Use  . Smoking status: Former Smoker    Packs/day: 1.00    Years: 4.00    Pack years: 4.00    Types: Cigarettes    Quit date: 05/21/1979    Years since quitting: 40.0  . Smokeless tobacco: Never Used  Substance and Sexual Activity  . Alcohol use: No    Alcohol/week: 0.0 standard drinks  . Drug use: No  . Sexual activity: Yes    Birth control/protection: Post-menopausal    Comment: 1st intercourse 63 yo-Fewer than 5 partners  Lifestyle  . Physical activity    Days per week: Not on file    Minutes per session: Not on file  . Stress: Not on file  Relationships  .  Social Herbalist on phone: Not on file    Gets together: Not on file    Attends religious service: Not on file    Active member of club or organization: Not on file    Attends meetings of clubs or  organizations: Not on file    Relationship status: Not on file  Other Topics Concern  . Not on file  Social History Narrative  . Not on file    Outpatient Encounter Medications as of 06/07/2019  Medication Sig  . allopurinol (ZYLOPRIM) 300 MG tablet TAKE 1 TABLET BY MOUTH EVERY DAY  . AMBULATORY NON FORMULARY MEDICATION Medication Name: Nitroglycerine ointment 0.125 %  Apply a pea sized amount internally four times daily. Dispense 30 GM zero refill (Patient taking differently: Medication Name: Nitroglycerine ointment 0.125 %  Apply a pea sized amount internally four times daily-PRN Dispense 30 GM zero refill)  . BIOTIN PO Take 1 tablet by mouth daily.   . Cholecalciferol (VITAMIN D-3) 5000 units TABS Take 1 tablet by mouth daily.  . DULoxetine (CYMBALTA) 60 MG capsule TAKE 2 CAPSULES BY MOUTH EVERY DAY  . Ergocalciferol (VITAMIN D2) 2000 units TABS Take by mouth every 30 (thirty) days. Patient does not know dosage  . famotidine (PEPCID) 20 MG tablet Take 1 tablet (20 mg total) by mouth 2 (two) times daily.  . folic acid (FOLVITE) 1 MG tablet Take 2 tablets (2 mg total) by mouth daily. (Patient not taking: Reported on 06/04/2019)  . hydrALAZINE (APRESOLINE) 10 MG tablet Take 10 mg by mouth 3 (three) times daily.  Marland Kitchen loperamide (LOPERAMIDE A-D) 2 MG tablet Take 0.5 tablets (1 mg total) by mouth daily.  . metoprolol succinate (TOPROL-XL) 100 MG 24 hr tablet Take 100 mg by mouth daily.   . Simethicone (GAS-X PO) Take 1 tablet by mouth at bedtime.   . sodium chloride 0.9 % SOLN 250 mL with vedolizumab 300 MG SOLR 300 mg Inject 300 mg into the vein every 30 (thirty) days.  Marland Kitchen tinidazole (TINDAMAX) 500 MG tablet Take 1 tablet (500 mg total) by mouth 2 (two) times daily. (Patient not taking: Reported on 06/04/2019)  . vitamin B-12 (CYANOCOBALAMIN) 100 MCG tablet Take 100 mcg by mouth daily.    No facility-administered encounter medications on file as of 06/07/2019.     Activities of Daily Living  No flowsheet data found.  Patient Care Team: Carollee Herter, Alferd Apa, DO as PCP - General Donato Heinz, MD (Nephrology) Armbruster, Carlota Raspberry, MD as Consulting Physician (Gastroenterology) Terrance Mass, MD (Inactive) as Consulting Physician (Gynecology) Rutherford Guys, MD as Consulting Physician (Ophthalmology) Nahser, Wonda Cheng, MD as Consulting Physician (Cardiology)    Assessment:   This is a routine wellness examination for [facility]. Physical assessment deferred to PCP.  Exercise Activities and Dietary recommendations   Diet (meal preparation, eat out, water intake, caffeinated beverages, dairy products, fruits and vegetables): in general, an "unhealthy" diet   Goals    . Lose 20 lbs by next year.   (pt-stated)     Increase physical activity-walking        Fall Risk Fall Risk  06/07/2019 06/05/2018 06/02/2017 03/30/2017 11/11/2015  Falls in the past year? 1 Yes Yes Yes No  Number falls in past yr: 1 1 1 1  -  Injury with Fall? 1 - No No -  Risk Factor Category  - - - - -  Risk for fall due to : - - - - -  Follow  up Education provided;Falls prevention discussed Education provided;Falls prevention discussed Education provided Falls prevention discussed -    Depression Screen PHQ 2/9 Scores 06/07/2019 06/05/2018 06/02/2017 05/10/2017  PHQ - 2 Score 0 0 0 0     Cognitive Function Ad8 score reviewed for issues:  Issues making decisions:no  Less interest in hobbies / activities:no  Repeats questions, stories (family complaining):no  Trouble using ordinary gadgets (microwave, computer, phone):no  Forgets the month or year: no  Mismanaging finances: no  Remembering appts:no  Daily problems with thinking and/or memory:no Ad8 score is=0     MMSE - Mini Mental State Exam 06/05/2018 06/02/2017  Orientation to time 5 5  Orientation to Place 5 5  Registration 3 3  Attention/ Calculation 5 5  Recall 3 3  Language- name 2 objects 2 2  Language- repeat 1 1  Language-  follow 3 step command 3 3  Language- read & follow direction 1 1  Write a sentence 1 1  Copy design 1 1  Total score 30 30        Immunization History  Administered Date(s) Administered  . Influenza Split 08/03/2011  . Influenza Whole 08/15/2007, 07/05/2008, 07/29/2009, 08/19/2010, 08/19/2012  . Influenza,inj,Quad PF,6+ Mos 07/23/2013, 06/12/2014, 06/02/2015  . Influenza-Unspecified 06/02/2016  . PPD Test 06/07/2011, 06/13/2012, 09/14/2013  . Pneumococcal Conjugate-13 06/04/2019  . Pneumococcal Polysaccharide-23 07/23/2013  . Tdap 09/13/2011     Screening Tests Health Maintenance  Topic Date Due  . HIV Screening  12/14/1970  . COLONOSCOPY  06/02/2016  . INFLUENZA VACCINE  06/21/2022 (Originally 04/07/2019)  . MAMMOGRAM  10/13/2019  . PNA vac Low Risk Adult (2 of 2 - PPSV23) 06/03/2020  . TETANUS/TDAP  09/12/2021  . DEXA SCAN  Completed  . Hepatitis C Screening  Completed       Plan:   See you next year!  Continue to eat heart healthy diet (full of fruits, vegetables, whole grains, lean protein, water--limit salt, fat, and sugar intake) and increase physical activity as tolerated.  Continue doing brain stimulating activities (puzzles, reading, adult coloring books, staying active) to keep memory sharp.     I have personally reviewed and noted the following in the patient's chart:   . Medical and social history . Use of alcohol, tobacco or illicit drugs  . Current medications and supplements . Functional ability and status . Nutritional status . Physical activity . Advanced directives . List of other physicians . Hospitalizations, surgeries, and ER visits in previous 12 months . Vitals . Screenings to include cognitive, depression, and falls . Referrals and appointments  In addition, I have reviewed and discussed with patient certain preventive protocols, quality metrics, and best practice recommendations. A written personalized care plan for preventive  services as well as general preventive health recommendations were provided to patient.     Shela Nevin, [place]  06/07/2019

## 2019-06-05 NOTE — Telephone Encounter (Signed)
Patient calling for xray results

## 2019-06-05 NOTE — Telephone Encounter (Signed)
SPoke with patient about xrays. Having repeat dislocations with any activity and in her sleep. Will refer to ortho.   Rosemarie Ax, MD [facility] 06/05/2019, 1:50 PM

## 2019-06-05 NOTE — Telephone Encounter (Signed)
Dr [facility] [address] [place]  Friday 06/08/2019 at 1245p [phone]

## 2019-06-06 LAB — QUANTIFERON-TB GOLD PLUS
Mitogen-NIL: >10 IU/mL
NIL: 0.05 IU/mL
QuantiFERON-TB Gold Plus: NEGATIVE
TB1-NIL: 0.02 IU/mL
TB2-NIL: 0.02 IU/mL

## 2019-06-07 ENCOUNTER — Ambulatory Visit (INDEPENDENT_AMBULATORY_CARE_PROVIDER_SITE_OTHER): Payer: Medicare Other | Admitting: *Deleted

## 2019-06-07 ENCOUNTER — Encounter: Payer: Self-pay | Admitting: *Deleted

## 2019-06-07 ENCOUNTER — Other Ambulatory Visit: Payer: Self-pay

## 2019-06-07 VITALS — Wt 197.0 lb

## 2019-06-07 DIAGNOSIS — Z0000 Encounter for general adult medical examination without abnormal findings: Secondary | ICD-10-CM

## 2019-06-07 NOTE — Addendum Note (Signed)
Addended by: Naaman Plummer A on: 06/07/2019 11:17 AM   Modules accepted: Level of Service

## 2019-06-07 NOTE — Patient Instructions (Signed)
See you next year!  Continue to eat heart healthy diet (full of fruits, vegetables, whole grains, lean protein, water--limit salt, fat, and sugar intake) and increase physical activity as tolerated.  Continue doing brain stimulating activities (puzzles, reading, adult coloring books, staying active) to keep memory sharp.    [name] , Thank you for taking time to come for your Medicare Wellness Visit. I appreciate your ongoing commitment to your health goals. Please review the following plan we discussed and let me know if I can assist you in the future.   These are the goals we discussed: Goals    . Lose 20 lbs by next year.   (pt-stated)     Increase physical activity-walking        This is a list of the screening recommended for you and due dates:  Health Maintenance  Topic Date Due  . HIV Screening  12/14/1970  . Colon Cancer Screening  06/02/2016  . Flu Shot  06/21/2022*  . Mammogram  10/13/2019  . Pneumonia vaccines (2 of 2 - PPSV23) 06/03/2020  . Tetanus Vaccine  09/12/2021  . DEXA scan (bone density measurement)  Completed  .  Hepatitis C: One time screening is recommended by Center for Disease Control  (CDC) for  adults born from [birth year] through 1967.   Completed  *Topic was postponed. The date shown is not the original due date.     Health Maintenance After Age 66 After age 67, you are at a higher risk for certain long-term diseases and infections as well as injuries from falls. Falls are a major cause of broken bones and head injuries in people who are older than age 47. Getting regular preventive care can help to keep you healthy and well. Preventive care includes getting regular testing and making lifestyle changes as recommended by your health care provider. Talk with your health care provider about:  Which screenings and tests you should have. A screening is a test that checks for a disease when you have no symptoms.  A diet and exercise plan that is right for you.  What should I know about screenings and tests to prevent falls? Screening and testing are the best ways to find a health problem early. Early diagnosis and treatment give you the best chance of managing medical conditions that are common after age 54. Certain conditions and lifestyle choices may make you more likely to have a fall. Your health care provider may recommend:  Regular vision checks. Poor vision and conditions such as cataracts can make you more likely to have a fall. If you wear glasses, make sure to get your prescription updated if your vision changes.  Medicine review. Work with your health care provider to regularly review all of the medicines you are taking, including over-the-counter medicines. Ask your health care provider about any side effects that may make you more likely to have a fall. Tell your health care provider if any medicines that you take make you feel dizzy or sleepy.  Osteoporosis screening. Osteoporosis is a condition that causes the bones to get weaker. This can make the bones weak and cause them to break more easily.  Blood pressure screening. Blood pressure changes and medicines to control blood pressure can make you feel dizzy.  Strength and balance checks. Your health care provider may recommend certain tests to check your strength and balance while standing, walking, or changing positions.  Foot health exam. Foot pain and numbness, as well as not wearing proper  footwear, can make you more likely to have a fall.  Depression screening. You may be more likely to have a fall if you have a fear of falling, feel emotionally low, or feel unable to do activities that you used to do.  Alcohol use screening. Using too much alcohol can affect your balance and may make you more likely to have a fall. What actions can I take to lower my risk of falls? General instructions  Talk with your health care provider about your risks for falling. Tell your health care  provider if: ? You fall. Be sure to tell your health care provider about all falls, even ones that seem minor. ? You feel dizzy, sleepy, or off-balance.  Take over-the-counter and prescription medicines only as told by your health care provider. These include any supplements.  Eat a healthy diet and maintain a healthy weight. A healthy diet includes low-fat dairy products, low-fat (lean) meats, and fiber from whole grains, beans, and lots of fruits and vegetables. Home safety  Remove any tripping hazards, such as rugs, cords, and clutter.  Install safety equipment such as grab bars in bathrooms and safety rails on stairs.  Keep rooms and walkways well-lit. Activity   Follow a regular exercise program to stay fit. This will help you maintain your balance. Ask your health care provider what types of exercise are appropriate for you.  If you need a cane or walker, use it as recommended by your health care provider.  Wear supportive shoes that have nonskid soles. Lifestyle  Do not drink alcohol if your health care provider tells you not to drink.  If you drink alcohol, limit how much you have: ? 0-1 drink a day for women. ? 0-2 drinks a day for men.  Be aware of how much alcohol is in your drink. In the [place] one drink equals one typical bottle of beer (12 oz), one-half glass of wine (5 oz), or one shot of hard liquor (1 oz).  Do not use any products that contain nicotine or tobacco, such as cigarettes and e-cigarettes. If you need help quitting, ask your health care provider. Summary  Having a healthy lifestyle and getting preventive care can help to protect your health and wellness after age 21.  Screening and testing are the best way to find a health problem early and help you avoid having a fall. Early diagnosis and treatment give you the best chance for managing medical conditions that are more common for people who are older than age 55.  Falls are a major cause of broken  bones and head injuries in people who are older than age 61. Take precautions to prevent a fall at home.  Work with your health care provider to learn what changes you can make to improve your health and wellness and to prevent falls. This information is not intended to replace advice given to you by your health care provider. Make sure you discuss any questions you have with your health care provider. Document Released: 07/06/2017 Document Revised: 12/14/2018 Document Reviewed: 07/06/2017 Elsevier Patient Education  2020 [employer].

## 2019-06-08 ENCOUNTER — Other Ambulatory Visit: Payer: Self-pay | Admitting: Orthopedic Surgery

## 2019-06-08 DIAGNOSIS — M25511 Pain in right shoulder: Secondary | ICD-10-CM | POA: Diagnosis not present

## 2019-06-11 ENCOUNTER — Other Ambulatory Visit: Payer: Self-pay | Admitting: Orthopedic Surgery

## 2019-06-11 ENCOUNTER — Other Ambulatory Visit (HOSPITAL_COMMUNITY)
Admission: RE | Admit: 2019-06-11 | Discharge: 2019-06-11 | Disposition: A | Discharge status: Home / Self Care | Payer: Medicare Other | Source: Ambulatory Visit | Attending: Orthopedic Surgery | Admitting: Orthopedic Surgery

## 2019-06-11 ENCOUNTER — Encounter: Payer: Medicare Other | Admitting: Gastroenterology

## 2019-06-11 ENCOUNTER — Telehealth: Payer: Self-pay | Admitting: Gastroenterology

## 2019-06-11 DIAGNOSIS — Z01812 Encounter for preprocedural laboratory examination: Secondary | ICD-10-CM | POA: Diagnosis not present

## 2019-06-11 DIAGNOSIS — T148XXA Other injury of unspecified body region, initial encounter: Secondary | ICD-10-CM

## 2019-06-11 DIAGNOSIS — Z20828 Contact with and (suspected) exposure to other viral communicable diseases: Secondary | ICD-10-CM | POA: Diagnosis not present

## 2019-06-11 DIAGNOSIS — S42142A Displaced fracture of glenoid cavity of scapula, left shoulder, initial encounter for closed fracture: Secondary | ICD-10-CM | POA: Insufficient documentation

## 2019-06-11 NOTE — Patient Instructions (Addendum)
DUE TO COVID-19 ONLY ONE VISITOR IS ALLOWED TO COME WITH YOU AND STAY IN THE WAITING ROOM ONLY DURING PRE OP AND PROCEDURE DAY OF SURGERY. THE 1 VISITOR MAY VISIT WITH YOU AFTER SURGERY IN YOUR PRIVATE ROOM DURING VISITING HOURS ONLY!   ONCE YOUR COVID TEST IS COMPLETED, PLEASE BEGIN THE QUARANTINE INSTRUCTIONS AS OUTLINED IN YOUR HANDOUT.                Gillie Fleites   Your procedure is scheduled on: 06-14-2019  Report to [facility] [address]  Entrance   Report to admitting at 730 AM     Call this number if you have problems the morning of surgery [phone]    Remember: [place], NO W[place].    NO SOLID FOOD AFTER MIDNIGHT THE NIGHT PRIOR TO SURGERY. NOTHING BY MOUTH EXCEPT CLEAR LIQUIDS UNTIL 630 am . PLEASE FINISH ENSURE DRINK PER SURGEON ORDER  WHICH NEEDS TO BE COMPLETED AT 630 am .   CLEAR LIQUID DIET   Foods Allowed                                                                     Foods Excluded  Coffee and tea, regular and decaf                             liquids that you cannot  Plain Jell-O any favor except red or purple                                           see through such as: Fruit ices (not with fruit pulp)                                     milk, soups, orange juice  Iced Popsicles                                    All solid food Carbonated beverages, regular and diet                                    Cranberry, grape and apple juices Sports drinks like Gatorade Lightly seasoned clear broth or consume(fat free) Sugar, honey syrup  Sample Menu Breakfast                                Lunch                                     Supper Cranberry juice                    Beef broth  Chicken broth Jell-O                                     Grape juice                           Apple juice Coffee or tea                        Jell-O                                       Popsicle                                                Coffee or tea                        Coffee or tea  _____________________________________________________________________    Take these medicines the morning of surgery with A SIP OF WATER:  hydrazaline (apresoline), metoprolol succinate,  famotidine (pepcid)                                 You may not have any metal on your body including hair pins and              piercings  Do not wear jewelry, make-up, lotions, powders or perfumes, deodorant             Do not wear nail polish on your fingernails.  Do not shave  48 hours prior to surgery.            Do not bring valuables to the hospital. [place].  Contacts, dentures or bridgework may not be worn into surgery.  Leave suitcase in the car. After surgery it may be brought to your room.     Patients discharged the day of surgery will not be allowed to drive home. IF YOU ARE HAVING SURGERY AND GOING HOME THE SAME DAY, YOU MUST HAVE AN ADULT TO DRIVE YOU HOME AND BE WITH YOU FOR 24 HOURS. YOU MAY GO HOME BY TAXI OR UBER OR ORTHERWISE, BUT AN ADULT MUST ACCOMPANY YOU HOME AND STAY WITH YOU FOR 24 HOURS.  Name and phone number of your driver: [place] [phone]  Special Instructions: N/A              Please read over the following fact sheets you were given: _____________________________________________________________________             [facility] - Preparing for Surgery Before surgery, you can play an important role.  Because skin is not sterile, your skin needs to be as free of germs as possible.  You can reduce the number of germs on your skin by washing with CHG (chlorahexidine gluconate) soap before surgery.  CHG is an antiseptic cleaner which kills germs and bonds with the skin to continue killing germs even after washing. Please DO NOT use if you  have an allergy to CHG or antibacterial  soaps.  If your skin becomes reddened/irritated stop using the CHG and inform your nurse when you arrive at Short Stay. Do not shave (including legs and underarms) for at least 48 hours prior to the first CHG shower.  You may shave your face/neck. Please follow these instructions carefully:  1.  Shower with CHG Soap the night before surgery and the  morning of Surgery.  2.  If you choose to wash your hair, wash your hair first as usual with your  normal  shampoo.  3.  After you shampoo, rinse your hair and body thoroughly to remove the  shampoo.                           4.  Use CHG as you would any other liquid soap.  You can apply chg directly  to the skin and wash                       Gently with a scrungie or clean washcloth.  5.  Apply the CHG Soap to your body ONLY FROM THE NECK DOWN.   Do not use on face/ open                           Wound or open sores. Avoid contact with eyes, ears mouth and genitals (private parts).                       Wash face,  Genitals (private parts) with your normal soap.             6.  Wash thoroughly, paying special attention to the area where your surgery  will be performed.  7.  Thoroughly rinse your body with warm water from the neck down.  8.  DO NOT shower/wash with your normal soap after using and rinsing off  the CHG Soap.                9.  Pat yourself dry with a clean towel.            10.  Wear clean pajamas.            11.  Place clean sheets on your bed the night of your first shower and do not  sleep with pets. Day of Surgery : Do not apply any lotions/deodorants the morning of surgery.  Please wear clean clothes to the hospital/surgery center.  FAILURE TO FOLLOW THESE INSTRUCTIONS MAY RESULT IN THE CANCELLATION OF YOUR SURGERY PATIENT SIGNATURE_________________________________  NURSE SIGNATURE__________________________________  ________________________________________________________________________   Adam Phenix  An  incentive spirometer is a tool that can help keep your lungs clear and active. This tool measures how well you are filling your lungs with each breath. Taking long deep breaths may help reverse or decrease the chance of developing breathing (pulmonary) problems (especially infection) following:  A long period of time when you are unable to move or be active. BEFORE THE PROCEDURE   If the spirometer includes an indicator to show your best effort, your nurse or respiratory therapist will set it to a desired goal.  If possible, sit up straight or lean slightly forward. Try not to slouch.  Hold the incentive spirometer in an upright position. INSTRUCTIONS FOR USE  1. Sit on the edge of your bed if possible, or sit up  as far as you can in bed or on a chair. 2. Hold the incentive spirometer in an upright position. 3. Breathe out normally. 4. Place the mouthpiece in your mouth and seal your lips tightly around it. 5. Breathe in slowly and as deeply as possible, raising the piston or the ball toward the top of the column. 6. Hold your breath for 3-5 seconds or for as long as possible. Allow the piston or ball to fall to the bottom of the column. 7. Remove the mouthpiece from your mouth and breathe out normally. 8. Rest for a few seconds and repeat Steps 1 through 7 at least 10 times every 1-2 hours when you are awake. Take your time and take a few normal breaths between deep breaths. 9. The spirometer may include an indicator to show your best effort. Use the indicator as a goal to work toward during each repetition. 10. After each set of 10 deep breaths, practice coughing to be sure your lungs are clear. If you have an incision (the cut made at the time of surgery), support your incision when coughing by placing a pillow or rolled up towels firmly against it. Once you are able to get out of bed, walk around indoors and cough well. You may stop using the incentive spirometer when instructed by your  caregiver.  RISKS AND COMPLICATIONS  Take your time so you do not get dizzy or light-headed.  If you are in pain, you may need to take or ask for pain medication before doing incentive spirometry. It is harder to take a deep breath if you are having pain. AFTER USE  Rest and breathe slowly and easily.  It can be helpful to keep track of a log of your progress. Your caregiver can provide you with a simple table to help with this. If you are using the spirometer at home, follow these instructions: [place] IF:   You are having difficultly using the spirometer.  You have trouble using the spirometer as often as instructed.  Your pain medication is not giving enough relief while using the spirometer.  You develop fever of 100.5 F (38.1 C) or higher. SEEK IMMEDIATE MEDICAL CARE IF:   You cough up bloody sputum that had not been present before.  You develop fever of 102 F (38.9 C) or greater.  You develop worsening pain at or near the incision site. MAKE SURE YOU:   Understand these instructions.  Will watch your condition.  Will get help right away if you are not doing well or get worse. Document Released: 01/03/2007 Document Revised: 11/15/2011 Document Reviewed: 03/06/2007 [facility] Patient Information 2014 [place], [place].   ________________________________________________________________________

## 2019-06-11 NOTE — Telephone Encounter (Signed)
Called patient back and let her know per Dr. Havery Moros it is ok to do her surgery and start the [facility]

## 2019-06-11 NOTE — Telephone Encounter (Signed)
I don't think it will conflict with her surgery and okay to take it, Entyvio does not cause systemic immunosuppression. Thanks

## 2019-06-12 LAB — NOVEL CORONAVIRUS, NAA (HOSP ORDER, SEND-OUT TO REF LAB; TAT 18-24 HRS): SARS-CoV-2, NAA: NOT DETECTED

## 2019-06-13 ENCOUNTER — Other Ambulatory Visit: Payer: Self-pay

## 2019-06-13 ENCOUNTER — Encounter: Payer: Self-pay | Admitting: Gynecology

## 2019-06-13 ENCOUNTER — Ambulatory Visit
Admission: RE | Admit: 2019-06-13 | Discharge: 2019-06-13 | Disposition: A | Discharge status: Home / Self Care | Payer: Medicare Other | Source: Ambulatory Visit | Attending: Orthopedic Surgery | Admitting: Orthopedic Surgery

## 2019-06-13 ENCOUNTER — Encounter (HOSPITAL_COMMUNITY)
Admission: RE | Admit: 2019-06-13 | Discharge: 2019-06-13 | Disposition: A | Discharge status: Home / Self Care | Payer: Medicare Other | Source: Ambulatory Visit | Attending: Orthopedic Surgery | Admitting: Orthopedic Surgery

## 2019-06-13 ENCOUNTER — Encounter (HOSPITAL_COMMUNITY): Payer: Self-pay

## 2019-06-13 DIAGNOSIS — K648 Other hemorrhoids: Secondary | ICD-10-CM | POA: Insufficient documentation

## 2019-06-13 DIAGNOSIS — I129 Hypertensive chronic kidney disease with stage 1 through stage 4 chronic kidney disease, or unspecified chronic kidney disease: Secondary | ICD-10-CM | POA: Insufficient documentation

## 2019-06-13 DIAGNOSIS — G2581 Restless legs syndrome: Secondary | ICD-10-CM | POA: Insufficient documentation

## 2019-06-13 DIAGNOSIS — K625 Hemorrhage of anus and rectum: Secondary | ICD-10-CM | POA: Diagnosis not present

## 2019-06-13 DIAGNOSIS — S42142A Displaced fracture of glenoid cavity of scapula, left shoulder, initial encounter for closed fracture: Secondary | ICD-10-CM | POA: Diagnosis not present

## 2019-06-13 DIAGNOSIS — K2100 Gastro-esophageal reflux disease with esophagitis, without bleeding: Secondary | ICD-10-CM | POA: Insufficient documentation

## 2019-06-13 DIAGNOSIS — Z01810 Encounter for preprocedural cardiovascular examination: Secondary | ICD-10-CM | POA: Insufficient documentation

## 2019-06-13 DIAGNOSIS — K644 Residual hemorrhoidal skin tags: Secondary | ICD-10-CM | POA: Insufficient documentation

## 2019-06-13 DIAGNOSIS — F419 Anxiety disorder, unspecified: Secondary | ICD-10-CM | POA: Insufficient documentation

## 2019-06-13 DIAGNOSIS — K5190 Ulcerative colitis, unspecified, without complications: Secondary | ICD-10-CM | POA: Diagnosis not present

## 2019-06-13 DIAGNOSIS — J45909 Unspecified asthma, uncomplicated: Secondary | ICD-10-CM | POA: Insufficient documentation

## 2019-06-13 DIAGNOSIS — E785 Hyperlipidemia, unspecified: Secondary | ICD-10-CM | POA: Diagnosis not present

## 2019-06-13 DIAGNOSIS — M1990 Unspecified osteoarthritis, unspecified site: Secondary | ICD-10-CM | POA: Insufficient documentation

## 2019-06-13 DIAGNOSIS — Z6834 Body mass index (BMI) 34.0-34.9, adult: Secondary | ICD-10-CM | POA: Diagnosis not present

## 2019-06-13 DIAGNOSIS — T148XXA Other injury of unspecified body region, initial encounter: Secondary | ICD-10-CM

## 2019-06-13 DIAGNOSIS — N183 Chronic kidney disease, stage 3 unspecified: Secondary | ICD-10-CM | POA: Diagnosis not present

## 2019-06-13 DIAGNOSIS — E213 Hyperparathyroidism, unspecified: Secondary | ICD-10-CM | POA: Insufficient documentation

## 2019-06-13 DIAGNOSIS — E669 Obesity, unspecified: Secondary | ICD-10-CM | POA: Diagnosis not present

## 2019-06-13 DIAGNOSIS — R9431 Abnormal electrocardiogram [ECG] [EKG]: Secondary | ICD-10-CM | POA: Diagnosis not present

## 2019-06-13 IMAGING — CT CT SHOULDER*L* W/O CM
1 series · 12 of 14 positions shown, 15 images · non-contrast
Comparison: None.

CLINICAL DATA: Left shoulder fracture.  Status post dislocation.

EXAM:
CT OF THE UPPER LEFT EXTREMITY WITHOUT CONTRAST
TECHNIQUE: Multidetector CT imaging of the upper left extremity was performed
according to the standard protocol.

[Series 3: soft tissue · axial · 0.51mm/px · z∈[-185,-35]mm · 12 of 89 slices shown, 15 images]
[im 7/89  soft-tissue]
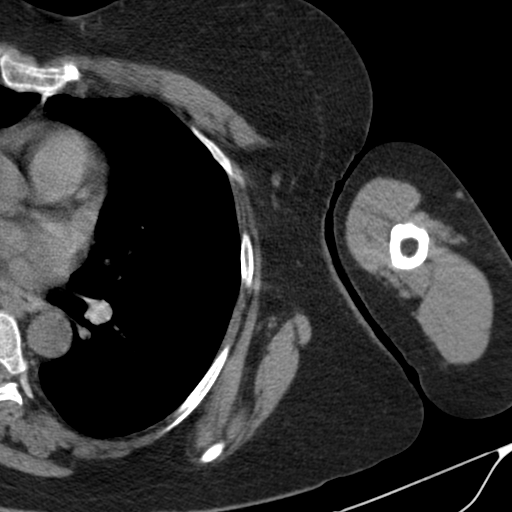
[im 7/89  bone]
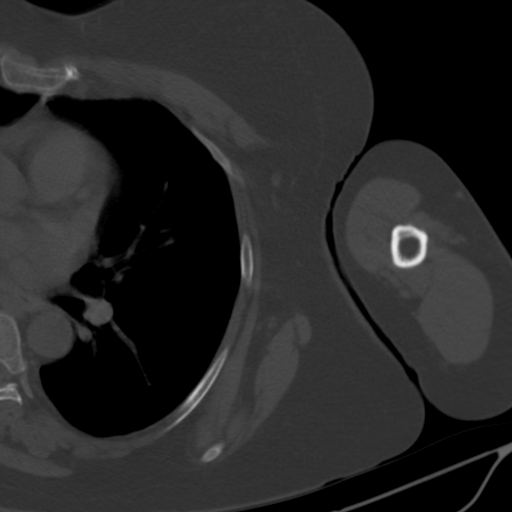
[im 14/89  bone]
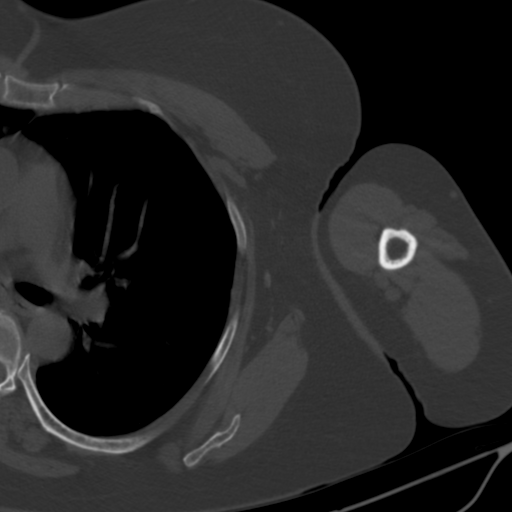
[im 21/89  bone]
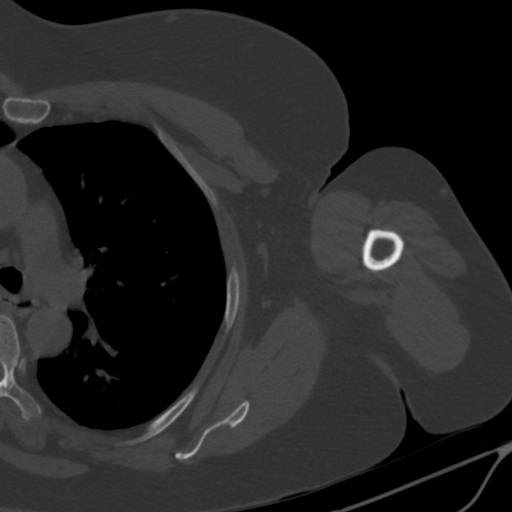
[im 28/89  bone]
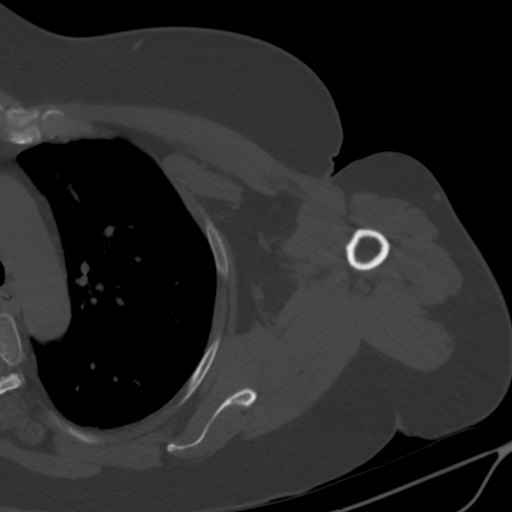
[im 34/89  soft-tissue]
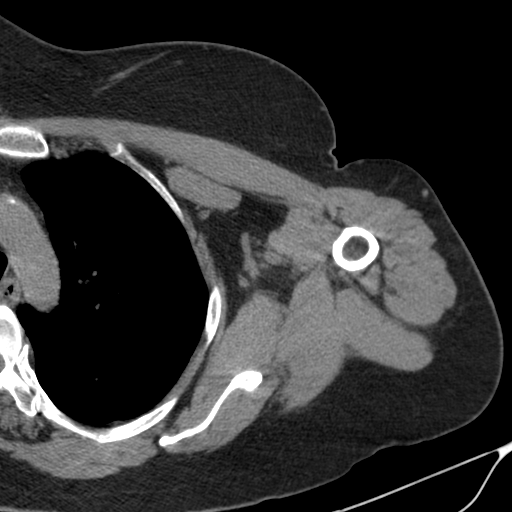
[im 34/89  bone]
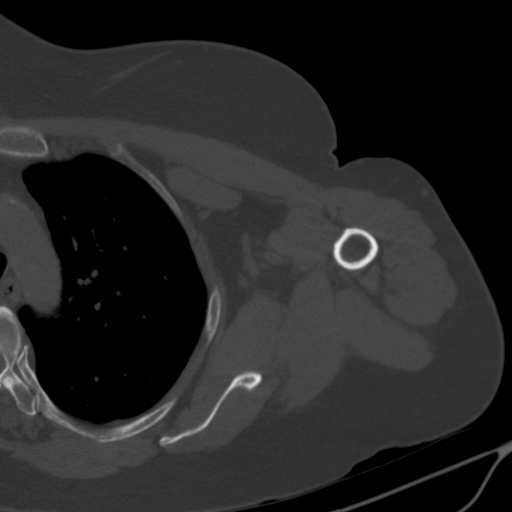
[im 41/89  bone]
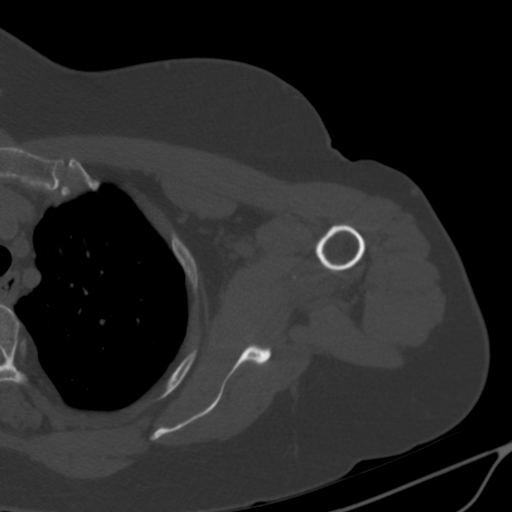
[im 48/89  bone]
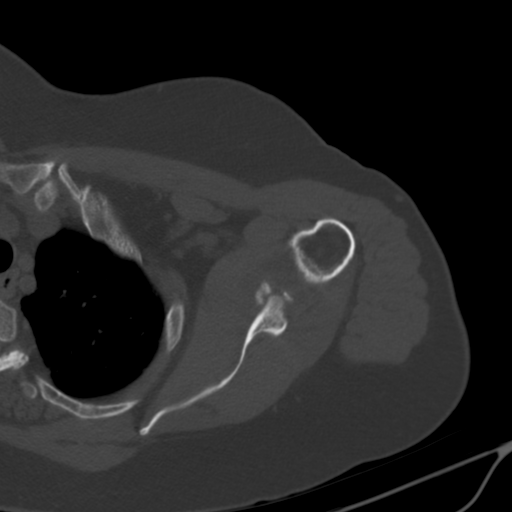
[im 55/89  bone]
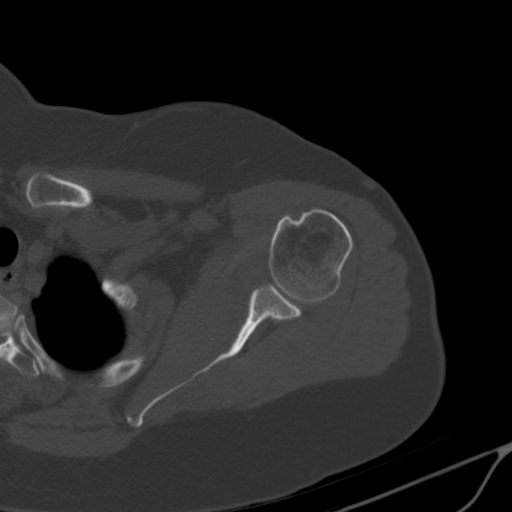
[im 61/89  soft-tissue]
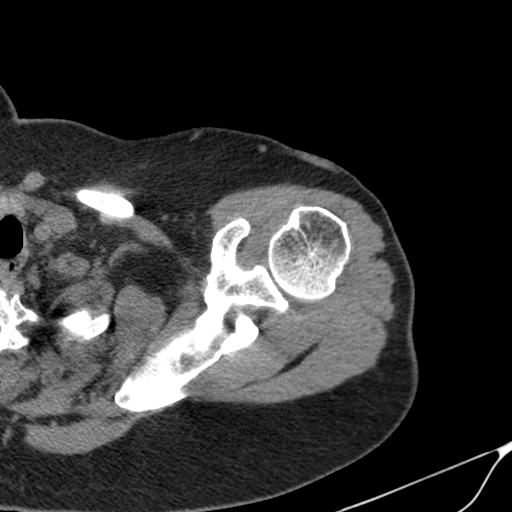
[im 61/89  bone]
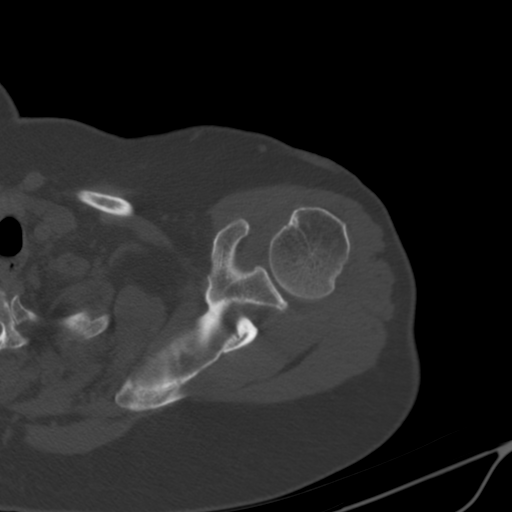
[im 68/89  bone]
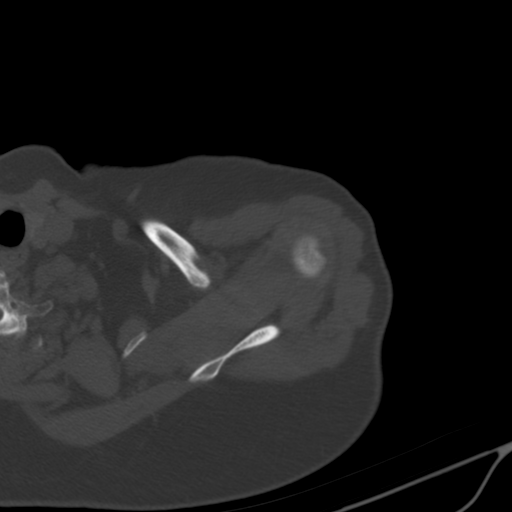
[im 75/89  bone]
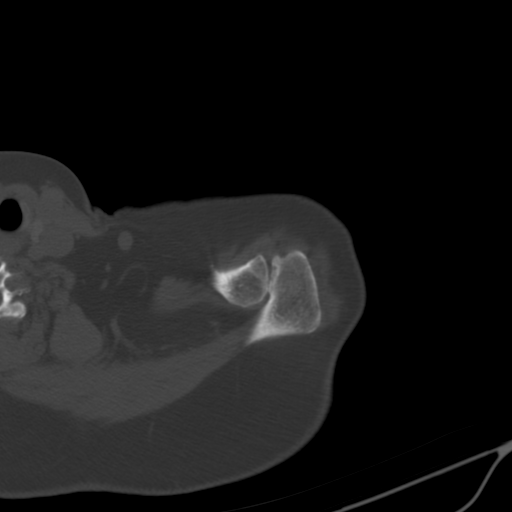
[im 82/89  bone]
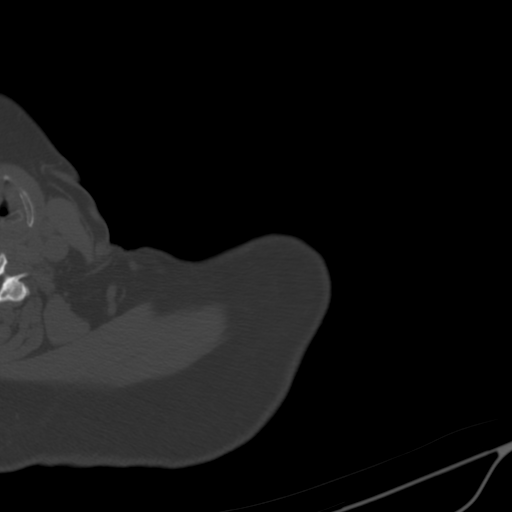

[12 of 14 positions shown; findings below may reference images not displayed]

FINDINGS: Bones/Joint/Cartilage

Acute comminuted fracture of the anterior inferior glenoid with 7 mm
of medial displacement and 10 mm of anterior displacement. 10 mm
displaced bony fragment is located in the axillary recess. Fracture
encompasses approximately 30% of the circumference. Moderate
glenohumeral joint effusion.

Flattening of the superior posterolateral humeral head consistent
with a Hill-Sachs lesion from prior dislocation.

No other acute fracture or dislocation. No aggressive osseous
lesion. Moderate arthropathy of the acromioclavicular joint. No
significant arthropathy of the glenohumeral joint.

Ligaments

Ligaments are suboptimally evaluated by CT.

Muscles and Tendons
Muscles are normal. No muscle atrophy. No intramuscular fluid
collection or hematoma.

Soft tissue
No fluid collection or hematoma. No soft tissue mass. Visualized
left lung is clear.
IMPRESSION: 1. Acute comminuted fracture of the anterior inferior glenoid with 7
mm of medial displacement and 10 mm of anterior displacement. 10 mm
displaced bony fragment is located in the axillary recess.
Flattening of the superior posterolateral humeral head consistent
with a Hill-Sachs lesion. Moderate glenohumeral joint effusion.
Overall findings consistent with prior anterior shoulder
dislocation.

## 2019-06-13 NOTE — Progress Notes (Signed)
Anesthesia Chart Review   Case: [id number] Date/Time: 06/14/19 0915   Procedure: LEFT SHOULDER ARTHRSCOPIC REPAIR OF BONY BANKART FRACTURE (Left Shoulder) - REQUEST 90 MINUTE   Anesthesia type: Choice   Pre-op diagnosis: LEFT SHOULDER BONY BANKART FRACTURE   Location: [place] [facility] / [facility] ORS   Surgeon: Tania Ade, MD      DISCUSSION:63 y.o. former smoker (4 pack years, quit 05/21/79) with h/o GERD, HTN, hyperparathyroidism, CKD Stage III, Crohn's, left shoulder bony bankart fracture scheduled for above procedure 06/14/2019 with Dr. Tania Ade.   Last seen by nephrologist, Dr. Donato Heinz 03/20/2019.  Stable at this visit.  Creatinine stable. Notes on chart.    VS: BP 122/82 Comment: Simultaneous filing. User may not have seen previous data.  Pulse 98   Temp 36.8 C (Oral)   Resp 16   Ht 5\' 3"  (1.6 m)   Wt 88.5 kg   SpO2 100%   BMI 34.54 kg/m   PROVIDERS: Ann Held, DO is PCP   Arty Baumgartner, MD is Nephrologist with [facility]  LABS: Labs reviewed: Acceptable for surgery. (all labs ordered are listed, but only abnormal results are displayed)  Labs Reviewed - No data to display   IMAGES:   EKG: 06/13/2019 Rate 96 bpm Normal sinus rhythm  Left axis deviation  Left ventricular hypertrophy   CV:  Past Medical History:  Diagnosis Date  . Abdominal pain    Hx  . Allergy   . Anal stenosis   . Anemia   . Anxiety   . Arthritis   . Asthma    patient does not have inhaler  . Blood in stool    Hx  . Blood in urine    Hx  . Blood transfusion without reported diagnosis   . CKD (chronic kidney disease) stage 3, GFR 30-59 ml/min   . Crohn's colitis, other complication ([place])   . De Quervain's tenosynovitis   . Depression   . Difficulty urinating    Hx  . Easy bruising   . Esophagitis   . Fainting    History - resolved - due to dehydration  . Fatigue    Hx  . Fibroid    Hx  . Gastric polyp   . GERD (gastroesophageal reflux disease)    . 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 Hemorrhoids, external   . Hemorrhoids, internal   . Herpes, genital    vaginal treated 07/05/12 and pt states is resolved  . History of cervical dysplasia   . History of small bowel obstruction   . Hyperlipidemia    currently no meds  . Hyperparathyroidism   . Hypertension   . Hypokalemia    Hx  . 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 Obesity   . Ovarian cyst   . Poor dental hygiene   . Pulmonary nodule, right    right upper lobe  . Rectal bleeding    Hx  . Rectal pain    Hx  . Renal insufficiency    CKD - stage 3  . RLS (restless legs syndrome)    no meds  . Tooth infection 11/2016   right low  . Ulcerative colitis   . Visual disturbance    wears glasses  . Weakness generalized    Hx - patient denies generalized weakness  . Wears dentures  upper only    Past Surgical History:  Procedure Laterality Date  . ANAL DILATION    . CERVICAL BIOPSY  W/ LOOP ELECTRODE EXCISION    . CHOLECYSTECTOMY    . COLONOSCOPY     [name]  . fatty tumor removed from back     X 2  . HEMORRHOID SURGERY    . ILEOSTOMY CLOSURE    . RESTORATIVE PROCTOCOLECTOMY     with insertion of ileoanal J Pouch with loop ileostomy  . SIGMOIDOSCOPY    . TOTAL ABDOMINAL HYSTERECTOMY  1998   TAH/LSO  . TUBAL LIGATION    . UPPER GASTROINTESTINAL ENDOSCOPY     [name]    MEDICATIONS: . allopurinol (ZYLOPRIM) 300 MG tablet  . AMBULATORY NON FORMULARY MEDICATION  . Biotin 5000 MCG TABS  . calcium carbonate (TUMS - DOSED IN MG ELEMENTAL CALCIUM) 500 MG chewable tablet  . DULoxetine (CYMBALTA) 60 MG capsule  . Ergocalciferol (VITAMIN D2) 2000 units TABS  . famotidine (PEPCID) 20 MG tablet  . folic acid (FOLVITE) 1 MG tablet  . hydrALAZINE (APRESOLINE) 50 MG tablet  . loperamide (LOPERAMIDE A-D) 2 MG tablet  . metoprolol succinate (TOPROL-XL)  100 MG 24 hr tablet  . simethicone (GAS-X) 80 MG chewable tablet  . sodium chloride 0.9 % SOLN 250 mL with vedolizumab 300 MG SOLR 300 mg  . tinidazole (TINDAMAX) 500 MG tablet  . vedolizumab (ENTYVIO) 300 MG injection   No current facility-administered medications for this encounter.     Maia Plan [facility] Pre-Surgical Testing [phone] 06/13/19  2:39 PM

## 2019-06-13 NOTE — Progress Notes (Addendum)
PCP - [facility] Cardiologist - DR Cathie Olden  LOV 07-21-17 Epic  NOTE STATES FOLLOW UP IN 3 MONTHS, PATIENT SAYS FOLLOW UP WAS PRN FOR BLOOD PRESSURE CONTROL  Chest x-ray - NONE EKG - 06-13-2019 Stress Test - 2008 NORMAL ECHO - NONE Cardiac Cath -  NONE Sleep Study - NONE CPAP - NONE  NEPHROLOGY DR COLODONATO LOV 03-20-19 ON CHART Fasting Blood Sugar - N/A Checks Blood Sugar _____ times a day  Blood Thinner Instructions:NONE Aspirin Instructions:NONE Last Dose: LABS Epic CBC WITH DIF, CMET 06-04-2019 Epic- LABS OK TO USE PER JESSICA ZANETTO PA FOR 06-14-19 SURGERY  Anesthesia review: CHART TO JESSICA ZANETTO PA FOR REVIEW  Patient denies shortness of breath, fever, cough and chest pain at PAT appointment   Patient verbalized understanding of instructions that were given to them at the PAT appointment. Patient was also instructed that they will need to review over the PAT instructions again at home before surgery.

## 2019-06-14 ENCOUNTER — Encounter (HOSPITAL_COMMUNITY)
Admission: RE | Disposition: A | Discharge status: Home / Self Care | Payer: Self-pay | Source: Home / Self Care | Attending: Orthopedic Surgery

## 2019-06-14 ENCOUNTER — Ambulatory Visit (HOSPITAL_COMMUNITY)
Admission: RE | Admit: 2019-06-14 | Discharge: 2019-06-14 | Disposition: A | Discharge status: Home / Self Care | Payer: Medicare Other | Attending: Orthopedic Surgery | Admitting: Orthopedic Surgery

## 2019-06-14 ENCOUNTER — Ambulatory Visit (HOSPITAL_COMMUNITY): Payer: Medicare Other | Admitting: Anesthesiology

## 2019-06-14 ENCOUNTER — Ambulatory Visit (HOSPITAL_COMMUNITY): Payer: Medicare Other | Admitting: Physician Assistant

## 2019-06-14 ENCOUNTER — Encounter (HOSPITAL_COMMUNITY): Payer: Self-pay | Admitting: Emergency Medicine

## 2019-06-14 ENCOUNTER — Other Ambulatory Visit: Payer: Self-pay

## 2019-06-14 DIAGNOSIS — M1990 Unspecified osteoarthritis, unspecified site: Secondary | ICD-10-CM | POA: Insufficient documentation

## 2019-06-14 DIAGNOSIS — Z79899 Other long term (current) drug therapy: Secondary | ICD-10-CM | POA: Insufficient documentation

## 2019-06-14 DIAGNOSIS — K219 Gastro-esophageal reflux disease without esophagitis: Secondary | ICD-10-CM | POA: Insufficient documentation

## 2019-06-14 DIAGNOSIS — N183 Chronic kidney disease, stage 3 unspecified: Secondary | ICD-10-CM | POA: Diagnosis not present

## 2019-06-14 DIAGNOSIS — I129 Hypertensive chronic kidney disease with stage 1 through stage 4 chronic kidney disease, or unspecified chronic kidney disease: Secondary | ICD-10-CM | POA: Insufficient documentation

## 2019-06-14 DIAGNOSIS — F329 Major depressive disorder, single episode, unspecified: Secondary | ICD-10-CM | POA: Diagnosis not present

## 2019-06-14 DIAGNOSIS — W19XXXA Unspecified fall, initial encounter: Secondary | ICD-10-CM | POA: Diagnosis not present

## 2019-06-14 DIAGNOSIS — Z6834 Body mass index (BMI) 34.0-34.9, adult: Secondary | ICD-10-CM | POA: Diagnosis not present

## 2019-06-14 DIAGNOSIS — S46012A Strain of muscle(s) and tendon(s) of the rotator cuff of left shoulder, initial encounter: Secondary | ICD-10-CM | POA: Diagnosis not present

## 2019-06-14 DIAGNOSIS — J45909 Unspecified asthma, uncomplicated: Secondary | ICD-10-CM | POA: Insufficient documentation

## 2019-06-14 DIAGNOSIS — E213 Hyperparathyroidism, unspecified: Secondary | ICD-10-CM | POA: Insufficient documentation

## 2019-06-14 DIAGNOSIS — S42142A Displaced fracture of glenoid cavity of scapula, left shoulder, initial encounter for closed fracture: Secondary | ICD-10-CM | POA: Diagnosis not present

## 2019-06-14 DIAGNOSIS — N184 Chronic kidney disease, stage 4 (severe): Secondary | ICD-10-CM | POA: Diagnosis not present

## 2019-06-14 DIAGNOSIS — F419 Anxiety disorder, unspecified: Secondary | ICD-10-CM | POA: Diagnosis not present

## 2019-06-14 DIAGNOSIS — Z87891 Personal history of nicotine dependence: Secondary | ICD-10-CM | POA: Insufficient documentation

## 2019-06-14 DIAGNOSIS — E669 Obesity, unspecified: Secondary | ICD-10-CM | POA: Insufficient documentation

## 2019-06-14 DIAGNOSIS — G8918 Other acute postprocedural pain: Secondary | ICD-10-CM | POA: Diagnosis not present

## 2019-06-14 DIAGNOSIS — S43492A Other sprain of left shoulder joint, initial encounter: Secondary | ICD-10-CM | POA: Diagnosis not present

## 2019-06-14 HISTORY — PX: SHOULDER ARTHROSCOPY WITH CAPSULORRHAPHY: SHX6454

## 2019-06-14 SURGERY — SHOULDER ATHROSCOPY WITH CAPSULORRHAPHY
Anesthesia: General | Site: Shoulder | Laterality: Left

## 2019-06-14 MED ORDER — ONDANSETRON HCL 4 MG/2ML IJ SOLN: 4.0000 mg | Freq: Once | INTRAMUSCULAR | Status: DC | PRN

## 2019-06-14 MED ORDER — ACETAMINOPHEN 325 MG PO TABS: 325.0000 mg | ORAL_TABLET | ORAL | Status: DC | PRN

## 2019-06-14 MED ORDER — FENTANYL CITRATE (PF) 250 MCG/5ML IJ SOLN
INTRAMUSCULAR | Status: DC | PRN
  Administered 2019-06-14 (×2): 50 ug via INTRAVENOUS

## 2019-06-14 MED ORDER — DEXAMETHASONE SODIUM PHOSPHATE 10 MG/ML IJ SOLN
INTRAMUSCULAR | Status: DC | PRN
  Administered 2019-06-14: 5 mg via INTRAVENOUS

## 2019-06-14 MED ORDER — CEFAZOLIN SODIUM-DEXTROSE 2-4 GM/100ML-% IV SOLN
2.0000 g | INTRAVENOUS | Status: AC
  Administered 2019-06-14: 2 g via INTRAVENOUS
  Filled 2019-06-14: qty 100

## 2019-06-14 MED ORDER — ROCURONIUM BROMIDE 10 MG/ML (PF) SYRINGE
PREFILLED_SYRINGE | INTRAVENOUS | Status: AC
  Filled 2019-06-14: qty 10

## 2019-06-14 MED ORDER — ONDANSETRON HCL 4 MG/2ML IJ SOLN
INTRAMUSCULAR | Status: DC | PRN
  Administered 2019-06-14: 4 mg via INTRAVENOUS

## 2019-06-14 MED ORDER — LIDOCAINE 2% (20 MG/ML) 5 ML SYRINGE
INTRAMUSCULAR | Status: DC | PRN
  Administered 2019-06-14: 60 mg via INTRAVENOUS

## 2019-06-14 MED ORDER — SODIUM CHLORIDE 0.9 % IV SOLN
INTRAVENOUS | Status: DC | PRN
  Administered 2019-06-14: 40 ug/min via INTRAVENOUS

## 2019-06-14 MED ORDER — LACTATED RINGERS IV SOLN: INTRAVENOUS | Status: DC

## 2019-06-14 MED ORDER — LIDOCAINE 2% (20 MG/ML) 5 ML SYRINGE
INTRAMUSCULAR | Status: AC
  Filled 2019-06-14: qty 5

## 2019-06-14 MED ORDER — MIDAZOLAM HCL 2 MG/2ML IJ SOLN
1.0000 mg | INTRAMUSCULAR | Status: DC
  Administered 2019-06-14: 09:00:00 2 mg via INTRAVENOUS
  Filled 2019-06-14: qty 2

## 2019-06-14 MED ORDER — PROPOFOL 10 MG/ML IV BOLUS
INTRAVENOUS | Status: AC
  Filled 2019-06-14: qty 20

## 2019-06-14 MED ORDER — FENTANYL CITRATE (PF) 100 MCG/2ML IJ SOLN: 25.0000 ug | INTRAMUSCULAR | Status: DC | PRN

## 2019-06-14 MED ORDER — CHLORHEXIDINE GLUCONATE 4 % EX LIQD: 60.0000 mL | Freq: Once | CUTANEOUS | Status: DC

## 2019-06-14 MED ORDER — PROPOFOL 10 MG/ML IV BOLUS
INTRAVENOUS | Status: DC | PRN
  Administered 2019-06-14: 60 mg via INTRAVENOUS
  Administered 2019-06-14: 140 mg via INTRAVENOUS

## 2019-06-14 MED ORDER — DEXAMETHASONE SODIUM PHOSPHATE 10 MG/ML IJ SOLN
INTRAMUSCULAR | Status: AC
  Filled 2019-06-14: qty 1

## 2019-06-14 MED ORDER — PHENYLEPHRINE 40 MCG/ML (10ML) SYRINGE FOR IV PUSH (FOR BLOOD PRESSURE SUPPORT)
PREFILLED_SYRINGE | INTRAVENOUS | Status: AC
  Filled 2019-06-14: qty 10

## 2019-06-14 MED ORDER — SODIUM CHLORIDE 0.9 % IR SOLN
Status: DC | PRN
  Administered 2019-06-14: 1000 mL

## 2019-06-14 MED ORDER — MEPERIDINE HCL 50 MG/ML IJ SOLN: 6.2500 mg | INTRAMUSCULAR | Status: DC | PRN

## 2019-06-14 MED ORDER — ONDANSETRON HCL 4 MG/2ML IJ SOLN
INTRAMUSCULAR | Status: AC
  Filled 2019-06-14: qty 2

## 2019-06-14 MED ORDER — BUPIVACAINE-EPINEPHRINE (PF) 0.5% -1:200000 IJ SOLN
INTRAMUSCULAR | Status: DC | PRN
  Administered 2019-06-14: 25 mL via PERINEURAL

## 2019-06-14 MED ORDER — FENTANYL CITRATE (PF) 100 MCG/2ML IJ SOLN
INTRAMUSCULAR | Status: AC
  Filled 2019-06-14: qty 2

## 2019-06-14 MED ORDER — OXYCODONE HCL 5 MG PO TABS: 5.0000 mg | ORAL_TABLET | Freq: Once | ORAL | Status: DC | PRN

## 2019-06-14 MED ORDER — OXYCODONE HCL 5 MG/5ML PO SOLN: 5.0000 mg | Freq: Once | ORAL | Status: DC | PRN

## 2019-06-14 MED ORDER — SODIUM CHLORIDE 0.9 % IV SOLN
INTRAVENOUS | Status: DC
  Administered 2019-06-14: 08:00:00 via INTRAVENOUS

## 2019-06-14 MED ORDER — CLONIDINE HCL (ANALGESIA) 100 MCG/ML EP SOLN
EPIDURAL | Status: DC | PRN
  Administered 2019-06-14: 100 ug

## 2019-06-14 MED ORDER — FENTANYL CITRATE (PF) 100 MCG/2ML IJ SOLN
50.0000 ug | INTRAMUSCULAR | Status: DC
  Administered 2019-06-14: 50 ug via INTRAVENOUS
  Filled 2019-06-14: qty 2

## 2019-06-14 MED ORDER — PHENYLEPHRINE 40 MCG/ML (10ML) SYRINGE FOR IV PUSH (FOR BLOOD PRESSURE SUPPORT)
PREFILLED_SYRINGE | INTRAVENOUS | Status: DC | PRN
  Administered 2019-06-14: 120 ug via INTRAVENOUS
  Administered 2019-06-14: 80 ug via INTRAVENOUS

## 2019-06-14 MED ORDER — LACTATED RINGERS IR SOLN
Status: DC | PRN
  Administered 2019-06-14 (×2): 3000 mL

## 2019-06-14 MED ORDER — ACETAMINOPHEN 160 MG/5ML PO SOLN: 325.0000 mg | ORAL | Status: DC | PRN

## 2019-06-14 MED ORDER — SUCCINYLCHOLINE CHLORIDE 200 MG/10ML IV SOSY
PREFILLED_SYRINGE | INTRAVENOUS | Status: DC | PRN
  Administered 2019-06-14: 160 mg via INTRAVENOUS

## 2019-06-14 SURGICAL SUPPLY — 67 items
AID PSTN UNV HD RSTRNT DISP (MISCELLANEOUS)
ANCH SUT SHRT 12.5 CANN EYLT (Anchor) ×2 IMPLANT
ANCHOR SUT BIOCOMP LK 2.9X12.5 (Anchor) ×2 IMPLANT
BOOTIES KNEE HIGH SLOAN (MISCELLANEOUS) ×4 IMPLANT
BURR OVAL 8 FLU 4.0X13 (MISCELLANEOUS) ×2 IMPLANT
CANNULA 5.75X7 CRYSTAL CLEAR (CANNULA) ×2 IMPLANT
CANNULA 5.75X71 LONG (CANNULA) IMPLANT
CANNULA TWIST IN 8.25X7CM (CANNULA) ×1 IMPLANT
COOLER ICEMAN CLASSIC (MISCELLANEOUS) ×1 IMPLANT
COVER SURGICAL LIGHT HANDLE (MISCELLANEOUS) ×2 IMPLANT
COVER WAND RF STERILE (DRAPES) IMPLANT
CUTTER BONE 4.0MM X 13CM (MISCELLANEOUS) ×2 IMPLANT
DISSECTOR  3.8MM X 13CM (MISCELLANEOUS)
DISSECTOR 3.8MM X 13CM (MISCELLANEOUS) IMPLANT
DRAPE IMP U-DRAPE 54X76 (DRAPES) ×2 IMPLANT
DRAPE INCISE IOBAN 66X45 STRL (DRAPES) IMPLANT
DRAPE ORTHO SPLIT 77X108 STRL (DRAPES) ×4
DRAPE STERI 35X30 U-POUCH (DRAPES) ×2 IMPLANT
DRAPE SURG ORHT 6 SPLT 77X108 (DRAPES) ×2 IMPLANT
DRAPE U-SHAPE 47X51 STRL (DRAPES) ×4 IMPLANT
DRSG PAD ABDOMINAL 8X10 ST (GAUZE/BANDAGES/DRESSINGS) ×3 IMPLANT
DURAPREP 26ML APPLICATOR (WOUND CARE) ×2 IMPLANT
ELECT REM PT RETURN 15FT ADLT (MISCELLANEOUS) ×1 IMPLANT
GAUZE SPONGE 4X4 12PLY STRL (GAUZE/BANDAGES/DRESSINGS) ×2 IMPLANT
GAUZE XEROFORM 1X8 LF (GAUZE/BANDAGES/DRESSINGS) ×2 IMPLANT
GLOVE BIO SURGEON STRL SZ7.5 (GLOVE) ×2 IMPLANT
GLOVE BIOGEL PI IND STRL 7.0 (GLOVE) ×1 IMPLANT
GLOVE BIOGEL PI IND STRL 8 (GLOVE) ×1 IMPLANT
GLOVE BIOGEL PI INDICATOR 7.0 (GLOVE) ×1
GLOVE BIOGEL PI INDICATOR 8 (GLOVE) ×1
GLOVE SURG SS PI 7.0 STRL IVOR (GLOVE) ×2 IMPLANT
GOWN STRL REUS W/TWL LRG LVL3 (GOWN DISPOSABLE) ×2 IMPLANT
GOWN STRL REUS W/TWL XL LVL3 (GOWN DISPOSABLE) ×2 IMPLANT
KIT BASIN OR (CUSTOM PROCEDURE TRAY) ×2 IMPLANT
KIT PUSHLOCK 2.9 HIP (KITS) ×1 IMPLANT
KIT STR SPEAR 1.8 FBRTK DISP (KITS) IMPLANT
KIT TURNOVER KIT A (KITS) IMPLANT
LASSO 90 CVE QUICKPAS (DISPOSABLE) ×1 IMPLANT
LASSO CRESCENT QUICKPASS (SUTURE) IMPLANT
MANIFOLD NEPTUNE II (INSTRUMENTS) ×2 IMPLANT
NDL HYPO 25X1 1.5 SAFETY (NEEDLE) IMPLANT
NDL SCORPION MULTI FIRE (NEEDLE) IMPLANT
NEEDLE HYPO 25X1 1.5 SAFETY (NEEDLE) IMPLANT
NEEDLE SCORPION MULTI FIRE (NEEDLE) IMPLANT
PACK ARTHROSCOPY DSU (CUSTOM PROCEDURE TRAY) ×2 IMPLANT
PAD COLD SHLDR WRAP-ON (PAD) ×1 IMPLANT
PROBE BIPOLAR ATHRO 135MM 90D (MISCELLANEOUS) ×2 IMPLANT
PROTECTOR NERVE ULNAR (MISCELLANEOUS) ×2 IMPLANT
RESTRAINT HEAD UNIVERSAL NS (MISCELLANEOUS) IMPLANT
SLING ARM FOAM STRAP LRG (SOFTGOODS) IMPLANT
SLING ARM FOAM STRAP MED (SOFTGOODS) IMPLANT
SLING ARM IMMOBILIZER LRG (SOFTGOODS) ×1 IMPLANT
SLING ARM IMMOBILIZER MED (SOFTGOODS) IMPLANT
SUPPORT WRAP ARM LG (MISCELLANEOUS) ×2 IMPLANT
SUT ETHILON 3 0 PS 1 (SUTURE) ×2 IMPLANT
SUT PDS AB 1 CT1 27 (SUTURE) IMPLANT
SUT TIGER TAPE 7 IN WHITE (SUTURE) IMPLANT
SUTURE TAPE 1.3 40 TPR END (SUTURE) IMPLANT
SUTURETAPE 1.3 40 TPR END (SUTURE)
TAPE CLOTH SURG 6X10 WHT LF (GAUZE/BANDAGES/DRESSINGS) ×1 IMPLANT
TAPE FIBER 2MM 7IN #2 BLUE (SUTURE) IMPLANT
TAPE LABRALWHITE 1.5X36 (TAPE) IMPLANT
TAPE SUT LABRALTAP WHT/BLK (SUTURE) ×2 IMPLANT
TOWEL OR 17X26 10 PK STRL BLUE (TOWEL DISPOSABLE) ×2 IMPLANT
TOWEL OR NON WOVEN STRL DISP B (DISPOSABLE) ×2 IMPLANT
TUBING ARTHROSCOPY IRRIG 16FT (MISCELLANEOUS) ×2 IMPLANT
TUBING CONNECTING 10 (TUBING) ×4 IMPLANT

## 2019-06-14 NOTE — Anesthesia Procedure Notes (Addendum)
Anesthesia Regional Block: Interscalene brachial plexus block   Pre-Anesthetic Checklist: ,, timeout performed, Correct Patient, Correct Site, Correct Laterality, Correct Procedure, Correct Position, site marked, Risks and benefits discussed,  Surgical consent,  Pre-op evaluation,  At surgeon's request and post-op pain management  Laterality: Left  Prep: chloraprep       Needles:  Injection technique: Single-shot  Needle Type: Echogenic Stimulator Needle     Needle Length: 5cm  Needle Gauge: 22     Additional Needles:   Procedures:, nerve stimulator,,, ultrasound used (permanent image in chart),,,,   Nerve Stimulator or Paresthesia:  Response: hand, 0.45 mA,   Additional Responses:   Narrative:  Start time: 06/14/2019 9:25 AM End time: 06/14/2019 9:30 AM Injection made incrementally with aspirations every 5 mL.  Performed by: Personally  Anesthesiologist: Janeece Riggers, MD  Additional Notes: Functioning IV was confirmed and monitors were applied.  A 65mm 22ga Arrow echogenic stimulator needle was used. Sterile prep and drape,hand hygiene and sterile gloves were used. Ultrasound guidance: relevant anatomy identified, needle position confirmed, local anesthetic spread visualized around nerve(s)., vascular puncture avoided.  Image printed for medical record. Negative aspiration and negative test dose prior to incremental administration of local anesthetic. The patient tolerated the procedure well.

## 2019-06-14 NOTE — H&P (Signed)
[name] is an 63 y.o. female.   Chief Complaint: Left shoulder fracture dislocation HPI: 63 year old female status post fall with left shoulder dislocation and anterior-inferior glenoid fracture.  She has had subsequent instability with even normal activities.  She was indicated for surgical treatment to try and restore stability to the joint.  Past Medical History:  Diagnosis Date  . Abdominal pain    Hx  . Allergy   . Anal stenosis   . Anemia   . Anxiety   . Arthritis   . Asthma    patient does not have inhaler  . Blood in stool    Hx  . Blood in urine    Hx  . Blood transfusion without reported diagnosis   . CKD (chronic kidney disease) stage 3, GFR 30-59 ml/min   . Crohn's colitis, other complication ([place])   . De Quervain's tenosynovitis   . Depression   . Difficulty urinating    Hx  . Easy bruising   . Esophagitis   . Fainting    History - resolved - due to dehydration  . Fatigue    Hx  . Fibroid    Hx  . Gastric polyp   . GERD (gastroesophageal reflux disease)   . 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 Hemorrhoids, external   . Hemorrhoids, internal   . Herpes, genital    vaginal treated 07/05/12 and pt states is resolved  . History of cervical dysplasia   . History of small bowel obstruction   . Hyperlipidemia    currently no meds  . Hyperparathyroidism   . Hypertension   . Hypokalemia    Hx  . 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 Obesity   . Ovarian cyst   . Poor dental hygiene   . Pulmonary nodule, right    right upper lobe  . Rectal bleeding    Hx  . Rectal pain    Hx  . Renal insufficiency    CKD - stage 3  . RLS (restless legs syndrome)    no meds  . Tooth infection 11/2016   right low  . Ulcerative colitis   . Visual disturbance    wears glasses  . Weakness generalized    Hx - patient denies generalized weakness  .  Wears dentures    upper only    Past Surgical History:  Procedure Laterality Date  . ANAL DILATION    . CERVICAL BIOPSY  W/ LOOP ELECTRODE EXCISION    . CHOLECYSTECTOMY    . COLONOSCOPY     [name]  . fatty tumor removed from back     X 2  . HEMORRHOID SURGERY    . ILEOSTOMY CLOSURE    . RESTORATIVE PROCTOCOLECTOMY     with insertion of ileoanal J Pouch with loop ileostomy  . SIGMOIDOSCOPY    . TOTAL ABDOMINAL HYSTERECTOMY  1998   TAH/LSO  . TUBAL LIGATION    . UPPER GASTROINTESTINAL ENDOSCOPY     [name]    Family History  Problem Relation Age of Onset  . Ulcerative colitis Father   . Hypertension Father   . Heart attack Father   . Hypertension Mother   . Heart disease Mother        s/p pci  . Ulcerative colitis Daughter   . Irritable bowel syndrome Other  grandchildren  . Diabetes Sister   . Cancer Sister        uterine  . Cancer Maternal Uncle        LUNG  . Colon cancer Neg Hx   . Esophageal cancer Neg Hx   . Stomach cancer Neg Hx   . Rectal cancer Neg Hx    Social History:  reports that she quit smoking about 40 years ago. Her smoking use included cigarettes. She has a 4.00 pack-year smoking history. She has never used smokeless tobacco. She reports that she does not drink alcohol or use drugs.  Allergies:  Allergies  Allergen Reactions  . Sulfa Antibiotics Hives  . Morphine Other (See Comments)    Gives me crazy dreams Gives me crazy dreams  . Sulfonamide Derivatives Itching, Swelling and Rash    Medications Prior to Admission  Medication Sig Dispense Refill  . allopurinol (ZYLOPRIM) 300 MG tablet TAKE 1 TABLET BY MOUTH EVERY DAY (Patient taking differently: Take 300 mg by mouth at bedtime. ) 30 tablet 2  . calcium carbonate (TUMS - DOSED IN MG ELEMENTAL CALCIUM) 500 MG chewable tablet Chew 1 tablet by mouth daily as needed for indigestion or heartburn.    . DULoxetine (CYMBALTA) 60 MG capsule TAKE 2 CAPSULES BY MOUTH EVERY DAY (Patient taking  differently: Take 120 mg by mouth at bedtime. ) 180 capsule 1  . famotidine (PEPCID) 20 MG tablet Take 1 tablet (20 mg total) by mouth 2 (two) times daily. 180 tablet 3  . hydrALAZINE (APRESOLINE) 50 MG tablet Take 50 mg by mouth 3 (three) times daily.     Marland Kitchen loperamide (LOPERAMIDE A-D) 2 MG tablet Take 0.5 tablets (1 mg total) by mouth daily. (Patient taking differently: Take 1 mg by mouth at bedtime. ) 180 tablet 3  . metoprolol succinate (TOPROL-XL) 100 MG 24 hr tablet Take 50 mg by mouth 2 (two) times daily.     . simethicone (GAS-X) 80 MG chewable tablet Chew 80 mg by mouth daily as needed [employer]).     . vedolizumab (ENTYVIO) 300 MG injection Inject 300 mg into the vein every 30 (thirty) days.    . AMBULATORY NON FORMULARY MEDICATION Medication Name: Nitroglycerine ointment 0.125 %  Apply a pea sized amount internally four times daily. Dispense 30 GM zero refill (Patient taking differently: Medication Name: Nitroglycerine ointment 0.125 %  Apply a pea sized amount internally four times daily-PRN Dispense 30 GM zero refill) 30 g 3  . Biotin 5000 MCG TABS Take 5,000 mg by mouth daily.     . Ergocalciferol (VITAMIN D2) 2000 units TABS Take 2,000 Units by mouth every 30 (thirty) days.     . folic acid (FOLVITE) 1 MG tablet Take 2 tablets (2 mg total) by mouth daily. (Patient not taking: Reported on 06/04/2019) 180 tablet 3  . sodium chloride 0.9 % SOLN 250 mL with vedolizumab 300 MG SOLR 300 mg Inject 300 mg into the vein every 30 (thirty) days. (Patient not taking: Reported on 06/12/2019) 300 mg 12  . tinidazole (TINDAMAX) 500 MG tablet Take 1 tablet (500 mg total) by mouth 2 (two) times daily. (Patient not taking: Reported on 06/04/2019) 60 tablet 1    No results found for this or any previous visit (from the past 48 hour(s)). Ct Shoulder Left Wo Contrast  Result Date: 06/13/2019 CLINICAL DATA:  Left shoulder fracture.  Status post dislocation. EXAM: CT OF THE UPPER LEFT EXTREMITY WITHOUT  CONTRAST TECHNIQUE: Multidetector CT imaging of the  upper left extremity was performed according to the standard protocol. COMPARISON:  None. FINDINGS: Bones/Joint/Cartilage Acute comminuted fracture of the anterior inferior glenoid with 7 mm of medial displacement and 10 mm of anterior displacement. 10 mm displaced bony fragment is located in the axillary recess. Fracture encompasses approximately 30% of the circumference. Moderate glenohumeral joint effusion. Flattening of the superior posterolateral humeral head consistent with a Hill-Sachs lesion from prior dislocation. No other acute fracture or dislocation. No aggressive osseous lesion. Moderate arthropathy of the acromioclavicular joint. No significant arthropathy of the glenohumeral joint. Ligaments Ligaments are suboptimally evaluated by CT. Muscles and Tendons Muscles are normal. No muscle atrophy. No intramuscular fluid collection or hematoma. Soft tissue No fluid collection or hematoma. No soft tissue mass. Visualized left lung is clear. IMPRESSION: 1. Acute comminuted fracture of the anterior inferior glenoid with 7 mm of medial displacement and 10 mm of anterior displacement. 10 mm displaced bony fragment is located in the axillary recess. Flattening of the superior posterolateral humeral head consistent with a Hill-Sachs lesion. Moderate glenohumeral joint effusion. Overall findings consistent with prior anterior shoulder dislocation. Electronically Signed   By: Kathreen Devoid   On: 06/13/2019 16:33    Review of Systems  All other systems reviewed and are negative.   Blood pressure (!) 147/94, pulse 99, temperature 98 F (36.7 C), temperature source Oral, resp. rate 18, height 5' 3"  (1.6 m), weight 88.5 kg, SpO2 96 %. Physical Exam  Constitutional: She is oriented to person, place, and time. She appears well-developed and well-nourished.  HENT:  Head: Atraumatic.  Eyes: EOM are normal.  Cardiovascular: Intact distal pulses.  Respiratory:  Effort normal.  Musculoskeletal:     Comments: Left shoulder with pain and apprehension with motion.  Distally grossly neurovascularly intact.  Neurological: She is alert and oriented to person, place, and time.  Skin: Skin is warm and dry.  Psychiatric: She has a normal mood and affect.     Assessment/Plan 63 year old female status post fall with left shoulder dislocation and anterior-inferior glenoid fracture.  She has had subsequent instability with even normal activities.  She was indicated for surgical treatment to try and restore stability to the joint.  Plan left shoulder arthroscopic bony Bankart repair Risks / benefits of surgery discussed Consent on chart  NPO for OR Preop antibiotics   Isabella Stalling, MD 06/14/2019, 9:16 AM

## 2019-06-14 NOTE — Anesthesia Preprocedure Evaluation (Signed)
Anesthesia Evaluation  Patient identified by MRN, date of birth, ID band Patient awake    Reviewed: Allergy & Precautions, H&P , NPO status , Patient's Chart, lab work & pertinent test results, reviewed documented beta blocker date and time   Airway Mallampati: II  TM Distance: >3 FB Neck ROM: full    Dental no notable dental hx.    Pulmonary asthma , former smoker,    Pulmonary exam normal breath sounds clear to auscultation       Cardiovascular Exercise Tolerance: Good hypertension, Pt. on medications negative cardio ROS   Rhythm:regular Rate:Normal     Neuro/Psych PSYCHIATRIC DISORDERS Anxiety Depression negative neurological ROS     GI/Hepatic Neg liver ROS, PUD, GERD  ,  Endo/Other  negative endocrine ROS  Renal/GU CRFRenal disease  negative genitourinary   Musculoskeletal  (+) Arthritis , Osteoarthritis,    Abdominal   Peds  Hematology  (+) Blood dyscrasia, anemia ,   Anesthesia Other Findings   Reproductive/Obstetrics negative OB ROS                             Anesthesia Physical Anesthesia Plan  ASA: III  Anesthesia Plan: General   Post-op Pain Management: GA combined w/ Regional for post-op pain   Induction: Intravenous  PONV Risk Score and Plan: 2  Airway Management Planned: Oral ETT and LMA  Additional Equipment:   Intra-op Plan:   Post-operative Plan: Extubation in OR  Informed Consent: I have reviewed the patients History and Physical, chart, labs and discussed the procedure including the risks, benefits and alternatives for the proposed anesthesia with the patient or authorized representative who has indicated his/her understanding and acceptance.     Dental Advisory Given  Plan Discussed with: CRNA, Anesthesiologist and Surgeon  Anesthesia Plan Comments: (Discussed both nerve block for pain relief post-op and GA; including NV, sore throat, dental  injury, and pulmonary complications)        Anesthesia Quick Evaluation

## 2019-06-14 NOTE — Op Note (Signed)
Procedure(s): LEFT SHOULDER ARTHRSCOPIC REPAIR OF BONY BANKART FRACTURE Procedure Note  [name] female 63 y.o. 06/14/2019  Preoperative diagnosis: Left shoulder fracture dislocation with bony Bankart injury  Postoperative diagnosis: #1 same #2 left shoulder partial-thickness supraspinatus tear  Procedure(s) and Anesthesia Type:    * LEFT SHOULDER ARTHRSCOPIC REPAIR OF BONY BANKART FRACTURE - Choice Left shoulder arthroscopic debridement partial supraspinatus tear  Surgeon(s) and Role:    Tania Ade, MD - Primary     Surgeon: Isabella Stalling   Assistants: None  Anesthesia: General endotracheal anesthesia with preoperative interscalene block given by the attending anesthesiologist    Procedure Detail  LEFT SHOULDER ARTHRSCOPIC REPAIR OF BONY BANKART FRACTURE  Estimated Blood Loss: Min         Drains: none  Blood Given: none         Specimens: none        Complications:  * No complications entered in OR log *         Disposition: PACU - hemodynamically stable.         Condition: stable    Procedure:   INDICATIONS FOR SURGERY: The patient is 63 y.o. female who suffered a left shoulder fracture dislocation with recurrent instability.  Indicated for surgical treatment to try and restore stability and restore articular anatomy.  OPERATIVE FINDINGS: Examination under anesthesia: She had significant anterior instability with palpable clunk in and out of the joint  DESCRIPTION OF PROCEDURE: The patient was identified in preoperative  holding area where I personally marked the operative site after  verifying site, side, and procedure with the patient. An interscalene block was given by the attending anesthesiologist the holding area.  The patient was taken back to the operating room where general anesthesia was induced without complication and was placed in the beach-chair position with the back  elevated about 60 degrees and all extremities and head  and neck carefully padded and  positioned.   The left upper extremity was then prepped and  draped in a standard sterile fashion. The appropriate time-out  procedure was carried out. The patient did receive IV antibiotics  within 30 minutes of incision.   A small posterior portal incision was made and the arthroscope was introduced into the joint. An anterior portal was then established above the subscapularis using needle localization. Small cannula was placed anteriorly. Diagnostic arthroscopy was then carried out.  Subscapularis was noted to be intact.  The biceps was intact.  The undersurface of the supraspinatus was noted to have a partial tear of about 20% of the articular side.  This was debrided back to healthy tendon.  The anterior inferior glenoid fracture was identified.  It was fairly comminuted with multiple small fragments.  It involved up to 30% of the articular surface anterior to posterior.  The labrum was intact on the anterior fragment.  The camera was moved to a high lateral rotator interval portal position.  Small cannula was placed posteriorly.  Viewing from this position I was able to free up the fracture fragments.  There was one fragment which was about 8 mm in diameter which was not attached to any associated soft tissues and was removed.  The remaining fragments were firmly fixed to the labrum and were able to be repaired arthroscopically.  The anterior fragments were mobilized and the fracture was cleaned of hematoma with a shaver.  At this point a labral tape was passed at the inferior margin of the fracture around the  labrum.  This was brought up to a 2.9 push lock anchor into the peripheral face of the glenoid at about the 7 o'clock position bringing the inferior aspect of the fracture and labrum back to an anatomic position.  At this point a second tape was passed at the superior aspect of the fracture around the bony labrum.  This was brought into an additional 2.9 push  lock anchor superiorly firmly anchoring the fracture and labrum back to an anatomic position.  Final repair was felt to be anatomic with nice compression at the fracture site.  The arthroscopic equipment was removed from the joint and the portals were closed with 3-0 nylon in an interrupted fashion. Sterile dressings were then applied including Xeroform 4 x 4's ABDs and tape. The patient was then allowed to awaken from general anesthesia, placed in a sling, transferred to the stretcher and taken to the recovery room in stable condition.   POSTOPERATIVE PLAN: The patient will be discharged home today and will followup in one week for suture removal and wound check.  We will keep her in a sling for 4 to 6 weeks with gentle passive range of motion exercises to allow healing.

## 2019-06-14 NOTE — Discharge Instructions (Addendum)
Discharge Instructions after Arthroscopic Shoulder Repair   A sling has been provided for you. Remain in your sling at all times. This includes sleeping in your sling.  Use ice on the shoulder intermittently over the first 48 hours after surgery.  Pain medicine has been prescribed for you.  Use your medicine liberally over the first 48 hours, and then you can begin to taper your use. You may take Extra Strength Tylenol or Tylenol only in place of the pain pills. DO NOT take ANY nonsteroidal anti-inflammatory pain medications: Advil, Motrin, Ibuprofen, Aleve, Naproxen, or Narprosyn.  You may remove your dressing after two days. If the incision sites are still moist, place a Band-Aid over the moist site(s). Change Band-Aids daily until dry.  You may shower 5 days after surgery. The incisions CANNOT get wet prior to 5 days. Simply allow the water to wash over the site and then pat dry. Do not rub the incisions. Make sure your axilla (armpit) is completely dry after showering.  Take one aspirin a day for 2 weeks after surgery, unless you have an aspirin sensitivity/ allergy or asthma.   Please call [phone] during normal business hours or [phone] after hours for any problems. Including the following:  - excessive redness of the incisions - drainage for more than 4 days - fever of more than 101.5 F  *Please note that pain medications will not be refilled after hours or on weekends.   General Anesthesia, Adult, Care After This sheet gives you information about how to care for yourself after your procedure. Your health care provider may also give you more specific instructions. If you have problems or questions, contact your health care provider. What can I expect after the procedure? After the procedure, the following side effects are common:  Pain or discomfort at the IV site.  Nausea.  Vomiting.  Sore throat.  Trouble concentrating.  Feeling cold or chills.  Weak or  tired.  Sleepiness and fatigue.  Soreness and body aches. These side effects can affect parts of the body that were not involved in surgery. Follow these instructions at home:  For at least 24 hours after the procedure:  Have a responsible adult stay with you. It is important to have someone help care for you until you are awake and alert.  Rest as needed.  Do not: ? Participate in activities in which you could fall or become injured. ? Drive. ? Use heavy machinery. ? Drink alcohol. ? Take sleeping pills or medicines that cause drowsiness. ? Make important decisions or sign legal documents. ? Take care of children on your own. Eating and drinking  Follow any instructions from your health care provider about eating or drinking restrictions.  When you feel hungry, start by eating small amounts of foods that are soft and easy to digest (bland), such as toast. Gradually return to your regular diet.  Drink enough fluid to keep your urine pale yellow.  If you vomit, rehydrate by drinking water, juice, or clear broth. General instructions  If you have sleep apnea, surgery and certain medicines can increase your risk for breathing problems. Follow instructions from your health care provider about wearing your sleep device: ? Anytime you are sleeping, including during daytime naps. ? While taking prescription pain medicines, sleeping medicines, or medicines that make you drowsy.  Return to your normal activities as told by your health care provider. Ask your health care provider what activities are safe for you.  Take over-the-counter and prescription  medicines only as told by your health care provider.  If you smoke, do not smoke without supervision.  Keep all follow-up visits as told by your health care provider. This is important. Contact a health care provider if:  You have nausea or vomiting that does not get better with medicine.  You cannot eat or drink without  vomiting.  You have pain that does not get better with medicine.  You are unable to pass urine.  You develop a skin rash.  You have a fever.  You have redness around your IV site that gets worse. Get help right away if:  You have difficulty breathing.  You have chest pain.  You have blood in your urine or stool, or you vomit blood. Summary  After the procedure, it is common to have a sore throat or nausea. It is also common to feel tired.  Have a responsible adult stay with you for the first 24 hours after general anesthesia. It is important to have someone help care for you until you are awake and alert.  When you feel hungry, start by eating small amounts of foods that are soft and easy to digest (bland), such as toast. Gradually return to your regular diet.  Drink enough fluid to keep your urine pale yellow.  Return to your normal activities as told by your health care provider. Ask your health care provider what activities are safe for you. This information is not intended to replace advice given to you by your health care provider. Make sure you discuss any questions you have with your health care provider. Document Released: 11/29/2000 Document Revised: 08/26/2017 Document Reviewed: 04/08/2017 Elsevier Patient Education  2020 [employer].

## 2019-06-14 NOTE — Transfer of Care (Signed)
Immediate Anesthesia Transfer of Care Note  Patient: [name]  Procedure(s) Performed: LEFT SHOULDER ARTHRSCOPIC REPAIR OF BONY BANKART FRACTURE (Left Shoulder)  Patient Location: PACU  Anesthesia Type:General  Level of Consciousness: sedated  Airway & Oxygen Therapy: Patient Spontanous Breathing and Patient connected to face mask oxygen  Post-op Assessment: Report given to RN and Post -op Vital signs reviewed and stable  Post vital signs: Reviewed and stable  Last Vitals:  Vitals Value Taken Time  BP 139/93 06/14/19 1114  Temp    Pulse 92 06/14/19 1114  Resp 15 06/14/19 1114  SpO2 100 % 06/14/19 1114  Vitals shown include unvalidated device data.  Last Pain:  Vitals:   06/14/19 0756  TempSrc:   PainSc: 0-No pain      Patients Stated Pain Goal: 4 (61/84/85 [id number])  Complications: No apparent anesthesia complications

## 2019-06-14 NOTE — Progress Notes (Signed)
Assisted Dr. Oddono with left, ultrasound guided, interscalene  block. Side rails up, monitors on throughout procedure. See vital signs in flow sheet. Tolerated Procedure well. 

## 2019-06-14 NOTE — Anesthesia Postprocedure Evaluation (Signed)
Anesthesia Post Note  Patient: [name]  Procedure(s) Performed: LEFT SHOULDER ARTHRSCOPIC REPAIR OF BONY BANKART FRACTURE (Left Shoulder)     Patient location during evaluation: PACU Anesthesia Type: General Level of consciousness: awake and alert Pain management: pain level controlled Vital Signs Assessment: post-procedure vital signs reviewed and stable Respiratory status: spontaneous breathing, nonlabored ventilation, respiratory function stable and patient connected to nasal cannula oxygen Cardiovascular status: blood pressure returned to baseline and stable Postop Assessment: no apparent nausea or vomiting Anesthetic complications: no    Last Vitals:  Vitals:   06/14/19 1130 06/14/19 1145  BP: 120/75 113/77  Pulse: 99 99  Resp: (!) 21 17  Temp:  36.4 C  SpO2: 96% 99%    Last Pain:  Vitals:   06/14/19 1145  TempSrc:   PainSc: 0-No pain                 Gaelen Brager

## 2019-06-14 NOTE — Progress Notes (Signed)
Notified Dr. Ambrose Pancoast that pt's significant other who lives with her has not been wearing a mask when going out of the house. Per Dr. Ambrose Pancoast, no repeat covid test needed today.

## 2019-06-14 NOTE — Anesthesia Procedure Notes (Signed)
Procedure Name: Intubation Date/Time: 06/14/2019 10:02 AM Performed by: Talbot Grumbling, CRNA Pre-anesthesia Checklist: Patient identified, Emergency Drugs available, Suction available and Patient being monitored Patient Re-evaluated:Patient Re-evaluated prior to induction Oxygen Delivery Method: Circle system utilized Induction Type: IV induction Ventilation: Mask ventilation without difficulty Laryngoscope Size: Mac and 3 Grade View: Grade I Tube type: Oral Tube size: 7.0 mm Number of attempts: 1 Airway Equipment and Method: Stylet Placement Confirmation: ETT inserted through vocal cords under direct vision,  positive ETCO2 and breath sounds checked- equal and bilateral Secured at: 21 cm Tube secured with: Tape Dental Injury: Teeth and Oropharynx as per pre-operative assessment

## 2019-06-15 ENCOUNTER — Encounter (HOSPITAL_COMMUNITY): Payer: Self-pay | Admitting: Orthopedic Surgery

## 2019-06-22 ENCOUNTER — Other Ambulatory Visit: Payer: Self-pay | Admitting: Family Medicine

## 2019-06-22 DIAGNOSIS — M25512 Pain in left shoulder: Secondary | ICD-10-CM | POA: Diagnosis not present

## 2019-06-23 DIAGNOSIS — Z23 Encounter for immunization: Secondary | ICD-10-CM | POA: Diagnosis not present

## 2019-06-26 NOTE — Addendum Note (Signed)
Addendum  created 06/26/19 1839 by Janeece Riggers, MD   Clinical Note Signed, Intraprocedure Blocks edited

## 2019-07-02 ENCOUNTER — Ambulatory Visit: Payer: Medicare Other | Admitting: Family Medicine

## 2019-07-03 DIAGNOSIS — D509 Iron deficiency anemia, unspecified: Secondary | ICD-10-CM | POA: Diagnosis not present

## 2019-07-03 DIAGNOSIS — N2581 Secondary hyperparathyroidism of renal origin: Secondary | ICD-10-CM | POA: Diagnosis not present

## 2019-07-03 DIAGNOSIS — E876 Hypokalemia: Secondary | ICD-10-CM | POA: Diagnosis not present

## 2019-07-03 DIAGNOSIS — I129 Hypertensive chronic kidney disease with stage 1 through stage 4 chronic kidney disease, or unspecified chronic kidney disease: Secondary | ICD-10-CM | POA: Diagnosis not present

## 2019-07-03 DIAGNOSIS — M109 Gout, unspecified: Secondary | ICD-10-CM | POA: Diagnosis not present

## 2019-07-03 DIAGNOSIS — D631 Anemia in chronic kidney disease: Secondary | ICD-10-CM | POA: Diagnosis not present

## 2019-07-03 DIAGNOSIS — N1831 Chronic kidney disease, stage 3a: Secondary | ICD-10-CM | POA: Diagnosis not present

## 2019-07-03 DIAGNOSIS — F329 Major depressive disorder, single episode, unspecified: Secondary | ICD-10-CM | POA: Diagnosis not present

## 2019-07-03 DIAGNOSIS — Z6831 Body mass index (BMI) 31.0-31.9, adult: Secondary | ICD-10-CM | POA: Diagnosis not present

## 2019-07-03 DIAGNOSIS — N179 Acute kidney failure, unspecified: Secondary | ICD-10-CM | POA: Diagnosis not present

## 2019-07-03 DIAGNOSIS — R768 Other specified abnormal immunological findings in serum: Secondary | ICD-10-CM | POA: Diagnosis not present

## 2019-07-05 ENCOUNTER — Other Ambulatory Visit: Payer: Self-pay | Admitting: Family Medicine

## 2019-07-05 DIAGNOSIS — F32A Depression, unspecified: Secondary | ICD-10-CM

## 2019-07-05 DIAGNOSIS — F329 Major depressive disorder, single episode, unspecified: Secondary | ICD-10-CM

## 2019-07-09 ENCOUNTER — Telehealth: Payer: Self-pay | Admitting: Gastroenterology

## 2019-07-09 NOTE — Telephone Encounter (Signed)
Patient called and says she has been having diarrhea with urgency for a couple of days. It is yellow, no muscus,no fever, no n/v. Having 2-3 a day. She has been taking 1/2 an Imodium a night. Please advise

## 2019-07-09 NOTE — Telephone Encounter (Signed)
Pt is calling she is having trouble making it to the restroom and is having diarrhea. She is not sure if it is an infection or C-diff. Asking for some advice on what she can do to help it.

## 2019-07-09 NOTE — Telephone Encounter (Signed)
She has had recurrent pouchitis. I would treat for this as she has responded well to antibiotics in the past. Let's give flagyl 576m BID for 2 weeks and she how she does. If she has worsening or does not respond then she needs to let [place]. Otherwise she is overdue for pouchoscopy, was supposed to schedule at last clinic visit but don't think she has had it yet. Can you help get that scheduled if she is willing at some point in the next 1-2 months. Thanks

## 2019-07-10 ENCOUNTER — Other Ambulatory Visit: Payer: Self-pay

## 2019-07-10 MED ORDER — METRONIDAZOLE 500 MG PO TABS: 500.0000 mg | ORAL_TABLET | Freq: Two times a day (BID) | ORAL | 0 refills | Status: DC

## 2019-07-10 NOTE — Telephone Encounter (Signed)
Okay thanks. Once she is recovered from her shoulder surgery would proceed with pouchoscopy. Thanks

## 2019-07-10 NOTE — Telephone Encounter (Signed)
Sent order to patient's pharmacy for Flagyl and gave patient instructions for taking. She said any fluids she drinks are going right through her and she thinks she is very dehydrated. I asked her to go to the ED for IV fluids if needed while waiting for the antibiotics to kick in. She said she would if fluids continued to go through her. States she had a pouchoscopy scheduled and had to cancel it for now, because of Left shoulder surgery on 06/14/19 and until it heals, she can't lay on her left side for the procedure. Asked her to call us if she feels worse or if the antibiotic does not make her feel better.

## 2019-08-17 DIAGNOSIS — Z9889 Other specified postprocedural states: Secondary | ICD-10-CM | POA: Diagnosis not present

## 2019-08-17 DIAGNOSIS — M25512 Pain in left shoulder: Secondary | ICD-10-CM | POA: Diagnosis not present

## 2019-08-28 DIAGNOSIS — M25612 Stiffness of left shoulder, not elsewhere classified: Secondary | ICD-10-CM | POA: Diagnosis not present

## 2019-09-19 DIAGNOSIS — M25612 Stiffness of left shoulder, not elsewhere classified: Secondary | ICD-10-CM | POA: Diagnosis not present

## 2019-10-11 DIAGNOSIS — M25612 Stiffness of left shoulder, not elsewhere classified: Secondary | ICD-10-CM | POA: Diagnosis not present

## 2019-10-17 DIAGNOSIS — M25612 Stiffness of left shoulder, not elsewhere classified: Secondary | ICD-10-CM | POA: Diagnosis not present

## 2019-10-17 DIAGNOSIS — Z09 Encounter for follow-up examination after completed treatment for conditions other than malignant neoplasm: Secondary | ICD-10-CM | POA: Diagnosis not present

## 2019-11-11 ENCOUNTER — Other Ambulatory Visit: Payer: Self-pay | Admitting: Family Medicine

## 2019-11-12 NOTE — Telephone Encounter (Signed)
April

## 2019-11-12 NOTE — Telephone Encounter (Signed)
Dr Carollee Herter -- I just refilled pt's allopurinol and see her last appt with you was 12/2018. When is she due for next appointment?

## 2019-11-29 NOTE — Telephone Encounter (Signed)
Spoke with pt and notified her of appt scheduled for 12/17/19 at 11am. Pt is agreeable to proceed as scheduled.

## 2019-12-17 ENCOUNTER — Encounter: Payer: Self-pay | Admitting: Family Medicine

## 2019-12-17 ENCOUNTER — Other Ambulatory Visit: Payer: Self-pay

## 2019-12-17 ENCOUNTER — Ambulatory Visit (INDEPENDENT_AMBULATORY_CARE_PROVIDER_SITE_OTHER): Payer: Medicare Other | Admitting: Family Medicine

## 2019-12-17 VITALS — BP 140/82 | HR 93 | Temp 97.4°F | Resp 18 | Ht 63.0 in | Wt 206.0 lb

## 2019-12-17 DIAGNOSIS — D631 Anemia in chronic kidney disease: Secondary | ICD-10-CM | POA: Diagnosis not present

## 2019-12-17 DIAGNOSIS — F322 Major depressive disorder, single episode, severe without psychotic features: Secondary | ICD-10-CM

## 2019-12-17 DIAGNOSIS — N183 Chronic kidney disease, stage 3 unspecified: Secondary | ICD-10-CM | POA: Diagnosis not present

## 2019-12-17 DIAGNOSIS — N184 Chronic kidney disease, stage 4 (severe): Secondary | ICD-10-CM

## 2019-12-17 DIAGNOSIS — M1A0710 Idiopathic chronic gout, right ankle and foot, without tophus (tophi): Secondary | ICD-10-CM

## 2019-12-17 DIAGNOSIS — E785 Hyperlipidemia, unspecified: Secondary | ICD-10-CM | POA: Diagnosis not present

## 2019-12-17 LAB — LIPID PANEL
Cholesterol: 208 mg/dL — ABNORMAL HIGH (ref 0–200)
HDL: 44.4 mg/dL (ref >39.00)
NonHDL: 164.01
Total CHOL/HDL Ratio: 5
Triglycerides: 223 mg/dL — ABNORMAL HIGH (ref 0.0–149.0)
VLDL: 44.6 mg/dL — ABNORMAL HIGH (ref 0.0–40.0)

## 2019-12-17 LAB — COMPREHENSIVE METABOLIC PANEL
ALT: 13 U/L (ref 0–35)
AST: 19 U/L (ref 0–37)
Albumin: 3.8 g/dL (ref 3.5–5.2)
Alkaline Phosphatase: 87 U/L (ref 39–117)
BUN: 36 mg/dL — ABNORMAL HIGH (ref 6–23)
CO2: 26 mEq/L (ref 19–32)
Calcium: 8.8 mg/dL (ref 8.4–10.5)
Chloride: 102 mEq/L (ref 96–112)
Creatinine, Ser: 2.12 mg/dL — ABNORMAL HIGH (ref 0.40–1.20)
GFR: 23.31 mL/min — ABNORMAL LOW (ref >60.00)
Glucose, Bld: 136 mg/dL — ABNORMAL HIGH (ref 70–99)
Potassium: 3.6 mEq/L (ref 3.5–5.1)
Sodium: 138 mEq/L (ref 135–145)
Total Bilirubin: 0.3 mg/dL (ref 0.2–1.2)
Total Protein: 6.6 g/dL (ref 6.0–8.3)

## 2019-12-17 LAB — IBC + FERRITIN
Ferritin: 59.9 ng/mL (ref 10.0–291.0)
Iron: 49 ug/dL (ref 42–145)
Saturation Ratios: 13.1 % — ABNORMAL LOW (ref 20.0–50.0)
Transferrin: 268 mg/dL (ref 212.0–360.0)

## 2019-12-17 LAB — CBC WITH DIFFERENTIAL/PLATELET
Basophils Absolute: 0.1 10*3/uL (ref 0.0–0.1)
Basophils Relative: 0.6 % (ref 0.0–3.0)
Eosinophils Absolute: 0.4 10*3/uL (ref 0.0–0.7)
Eosinophils Relative: 4.6 % (ref 0.0–5.0)
HCT: 29.6 % — ABNORMAL LOW (ref 36.0–46.0)
Hemoglobin: 9.7 g/dL — ABNORMAL LOW (ref 12.0–15.0)
Lymphocytes Relative: 27 % (ref 12.0–46.0)
Lymphs Abs: 2.2 10*3/uL (ref 0.7–4.0)
MCHC: 32.9 g/dL (ref 30.0–36.0)
MCV: 99.7 fl (ref 78.0–100.0)
Monocytes Absolute: 0.5 10*3/uL (ref 0.1–1.0)
Monocytes Relative: 6.4 % (ref 3.0–12.0)
Neutro Abs: 5 10*3/uL (ref 1.4–7.7)
Neutrophils Relative %: 61.4 % (ref 43.0–77.0)
Platelets: 307 10*3/uL (ref 150.0–400.0)
RBC: 2.97 Mil/uL — ABNORMAL LOW (ref 3.87–5.11)
RDW: 15.7 % — ABNORMAL HIGH (ref 11.5–15.5)
WBC: 8.2 10*3/uL (ref 4.0–10.5)

## 2019-12-17 LAB — LDL CHOLESTEROL, DIRECT: Direct LDL: 108 mg/dL

## 2019-12-17 LAB — URIC ACID: Uric Acid, Serum: 4.2 mg/dL (ref 2.4–7.0)

## 2019-12-17 MED ORDER — DULOXETINE HCL 60 MG PO CPEP: 60.0000 mg | ORAL_CAPSULE | Freq: Every day | ORAL | 3 refills | Status: DC

## 2019-12-17 MED ORDER — FLUOXETINE HCL 20 MG PO TABS: 20.0000 mg | ORAL_TABLET | Freq: Every day | ORAL | 3 refills | Status: DC

## 2019-12-17 NOTE — Progress Notes (Signed)
Patient ID: [name], female    DOB: 1956/06/22  Age: 64 y.o. MRN: [id number]    Subjective:  Subjective  HPI [name] presents for f/u  Review of Systems  History Past Medical History:  Diagnosis Date  . Abdominal pain    Hx  . Allergy   . Anal stenosis   . Anemia   . Anxiety   . Arthritis   . Asthma    patient does not have inhaler  . Blood in stool    Hx  . Blood in urine    Hx  . Blood transfusion without reported diagnosis   . CKD (chronic kidney disease) stage 3, GFR 30-59 ml/min   . Crohn's colitis, other complication ([place])   . De Quervain's tenosynovitis   . Depression   . Difficulty urinating    Hx  . Easy bruising   . Esophagitis   . Fainting    History - resolved - due to dehydration  . Fatigue    Hx  . Fibroid    Hx  . Gastric polyp   . GERD (gastroesophageal reflux disease)   . 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 Hemorrhoids, external   . Hemorrhoids, internal   . Herpes, genital    vaginal treated 07/05/12 and pt states is resolved  . History of cervical dysplasia   . History of small bowel obstruction   . Hyperlipidemia    currently no meds  . Hyperparathyroidism   . Hypertension   . Hypokalemia    Hx  . 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 Obesity   . Ovarian cyst   . Poor dental hygiene   . Pulmonary nodule, right    right upper lobe  . Rectal bleeding    Hx  . Rectal pain    Hx  . Renal insufficiency    CKD - stage 3  . RLS (restless legs syndrome)    no meds  . Tooth infection 11/2016   right low  . Ulcerative colitis   . Visual disturbance    wears glasses  . Weakness generalized    Hx - patient denies generalized weakness  . Wears dentures    upper only    She has a past surgical history that includes Anal dilation; Restorative proctocolectomy; Ileostomy closure; Cholecystectomy; Tubal  ligation; Hemorrhoid surgery; fatty tumor removed from back; Cervical biopsy w/ loop electrode excision; Upper gastrointestinal endoscopy; Colonoscopy; Sigmoidoscopy; Total abdominal hysterectomy (1998); and Shoulder arthroscopy with capsulorrhaphy (Left, 06/14/2019).   Her family history includes Cancer in her maternal uncle and sister; Diabetes in her sister; Heart attack in her father; Heart disease in her mother; Hypertension in her father and mother; Irritable bowel syndrome in an other family member; Ulcerative colitis in her daughter and father.She reports that she quit smoking about 40 years ago. Her smoking use included cigarettes. She has a 4.00 pack-year smoking history. She has never used smokeless tobacco. She reports that she does not drink alcohol or use drugs.  Current Outpatient Medications on File Prior to Visit  Medication Sig Dispense Refill  . allopurinol (ZYLOPRIM) 300 MG tablet TAKE 1 TABLET BY MOUTH EVERY DAY 30 tablet 1  . AMBULATORY NON FORMULARY MEDICATION Medication Name: Nitroglycerine ointment 0.125 %  Apply a pea sized amount internally four times daily. Dispense 30  GM zero refill (Patient taking differently: Medication Name: Nitroglycerine ointment 0.125 %  Apply a pea sized amount internally four times daily-PRN Dispense 30 GM zero refill) 30 g 3  . Biotin 5000 MCG TABS Take 5,000 mg by mouth daily.     . calcium carbonate (TUMS - DOSED IN MG ELEMENTAL CALCIUM) 500 MG chewable tablet Chew 1 tablet by mouth daily as needed for indigestion or heartburn.    . Ergocalciferol (VITAMIN D2) 2000 units TABS Take 2,000 Units by mouth every 30 (thirty) days.     . famotidine (PEPCID) 20 MG tablet Take 1 tablet (20 mg total) by mouth 2 (two) times daily. 801 tablet 3  . folic acid (FOLVITE) 1 MG tablet Take 2 tablets (2 mg total) by mouth daily. 180 tablet 3  . hydrALAZINE (APRESOLINE) 50 MG tablet Take 50 mg by mouth 3 (three) times daily.     Marland Kitchen loperamide (LOPERAMIDE A-D) 2  MG tablet Take 0.5 tablets (1 mg total) by mouth daily. (Patient taking differently: Take 1 mg by mouth at bedtime. ) 180 tablet 3  . metoprolol succinate (TOPROL-XL) 100 MG 24 hr tablet Take 50 mg by mouth 2 (two) times daily.     . metroNIDAZOLE (FLAGYL) 500 MG tablet Take 1 tablet (500 mg total) by mouth 2 (two) times daily. 28 tablet 0  . simethicone (GAS-X) 80 MG chewable tablet Chew 80 mg by mouth daily as needed [employer]).     Marland Kitchen tinidazole (TINDAMAX) 500 MG tablet Take 1 tablet (500 mg total) by mouth 2 (two) times daily. 60 tablet 1  . vedolizumab (ENTYVIO) 300 MG injection Inject 300 mg into the vein every 30 (thirty) days.     No current facility-administered medications on file prior to visit.     Objective:  Objective  Physical Exam BP 140/82 (BP Location: Right Arm, Patient Position: Sitting, Cuff Size: Large)   Pulse 93   Temp (!) 97.4 F (36.3 C) (Temporal)   Resp 18   Ht 5' 3"  (1.6 m)   Wt 206 lb (93.4 kg)   SpO2 99%   BMI 36.49 kg/m  Wt Readings from Last 3 Encounters:  12/17/19 206 lb (93.4 kg)  06/14/19 195 lb (88.5 kg)  06/13/19 195 lb (88.5 kg)     Lab Results  Component Value Date   WBC 10.9 (H) 06/04/2019   HGB 10.8 (L) 06/04/2019   HCT 32.4 (L) 06/04/2019   PLT 344.0 06/04/2019   GLUCOSE 94 06/04/2019   CHOL 247 (H) 05/10/2017   TRIG 307.0 (H) 05/10/2017   HDL 45.70 05/10/2017   LDLDIRECT 127.0 05/10/2017   LDLCALC 122 (H) 11/22/2014   ALT 13 06/04/2019   AST 15 06/04/2019   NA 137 06/04/2019   K 3.8 06/04/2019   CL 103 06/04/2019   CREATININE 2.10 (H) 06/04/2019   BUN 32 (H) 06/04/2019   CO2 25 06/04/2019   TSH 2.624 01/15/2017   INR 0.95 09/02/2017    CT SHOULDER LEFT WO CONTRAST  Result Date: 06/13/2019 CLINICAL DATA:  Left shoulder fracture.  Status post dislocation. EXAM: CT OF THE UPPER LEFT EXTREMITY WITHOUT CONTRAST TECHNIQUE: Multidetector CT imaging of the upper left extremity was performed according to the standard protocol.  COMPARISON:  None. FINDINGS: Bones/Joint/Cartilage Acute comminuted fracture of the anterior inferior glenoid with 7 mm of medial displacement and 10 mm of anterior displacement. 10 mm displaced bony fragment is located in the axillary recess. Fracture encompasses approximately 30% of the circumference. Moderate  glenohumeral joint effusion. Flattening of the superior posterolateral humeral head consistent with a Hill-Sachs lesion from prior dislocation. No other acute fracture or dislocation. No aggressive osseous lesion. Moderate arthropathy of the acromioclavicular joint. No significant arthropathy of the glenohumeral joint. Ligaments Ligaments are suboptimally evaluated by CT. Muscles and Tendons Muscles are normal. No muscle atrophy. No intramuscular fluid collection or hematoma. Soft tissue No fluid collection or hematoma. No soft tissue mass. Visualized left lung is clear. IMPRESSION: 1. Acute comminuted fracture of the anterior inferior glenoid with 7 mm of medial displacement and 10 mm of anterior displacement. 10 mm displaced bony fragment is located in the axillary recess. Flattening of the superior posterolateral humeral head consistent with a Hill-Sachs lesion. Moderate glenohumeral joint effusion. Overall findings consistent with prior anterior shoulder dislocation. Electronically Signed   By: Kathreen Devoid   On: 06/13/2019 16:33     Assessment & Plan:  Plan  I have discontinued Devri A. Speth's DULoxetine. I am also having her start on DULoxetine and FLUoxetine. Additionally, I am having her maintain her Biotin, folic acid, simethicone, Vitamin D2, AMBULATORY NON FORMULARY MEDICATION, metoprolol succinate, hydrALAZINE, tinidazole, loperamide, famotidine, Entyvio, calcium carbonate, metroNIDAZOLE, and allopurinol.  Meds ordered this encounter  Medications  . DULoxetine (CYMBALTA) 60 MG capsule    Sig: Take 1 capsule (60 mg total) by mouth daily.    Dispense:  30 capsule    Refill:  3  .  FLUoxetine (PROZAC) 20 MG tablet    Sig: Take 1 tablet (20 mg total) by mouth daily.    Dispense:  30 tablet    Refill:  3    Problem List Items Addressed This Visit      Unprioritized   ANEMIA   Relevant Orders   CBC with Differential/Platelet   Comprehensive metabolic panel   IBC + Ferritin   CKD (chronic kidney disease), stage IV (HCC)   Gout of foot   Relevant Orders   Comprehensive metabolic panel   Uric acid    Other Visit Diagnoses    Depression, major, single episode, severe ([place])    -  Primary   Relevant Medications   DULoxetine (CYMBALTA) 60 MG capsule   FLUoxetine (PROZAC) 20 MG tablet   Hyperlipidemia, unspecified hyperlipidemia type       Relevant Orders   Lipid panel   Comprehensive metabolic panel      Follow-up: Return in about 4 weeks (around 01/14/2020) for depression.  Ann Held, DO

## 2019-12-17 NOTE — Patient Instructions (Signed)
Major Depressive Disorder, Adult Major depressive disorder (MDD) is a mental health condition. MDD often makes you feel sad, hopeless, or helpless. MDD can also cause symptoms in your body. MDD can affect your:  Work.  School.  Relationships.  Other normal activities. MDD can range from mild to very bad. It may occur once (single episode MDD). It can also occur many times (recurrent MDD). The main symptoms of MDD often include:  Feeling sad, depressed, or irritable most of the time.  Loss of interest. MDD symptoms also include:  Sleeping too much or too little.  Eating too much or too little.  A change in your weight.  Feeling tired (fatigue) or having low energy.  Feeling worthless.  Feeling guilty.  Trouble making decisions.  Trouble thinking clearly.  Thoughts of suicide or harming others.  Feeling weak.  Feeling agitated.  Keeping yourself from being around other people (isolation). Follow these instructions at home: Activity  Do these things as told by your doctor: ? Go back to your normal activities. ? Exercise regularly. ? Spend time outdoors. Alcohol  Talk with your doctor about how alcohol can affect your antidepressant medicines.  Do not drink alcohol. Or, limit how much alcohol you drink. ? This means no more than 1 drink a day for nonpregnant women and 2 drinks a day for men. One drink equals one of these:  12 oz of beer.  5 oz of wine.  1 oz of hard liquor. General instructions  Take over-the-counter and prescription medicines only as told by your doctor.  Eat a healthy diet.  Get plenty of sleep.  Find activities that you enjoy. Make time to do them.  Think about joining a support group. Your doctor may be able to suggest a group for you.  Keep all follow-up visits as told by your doctor. This is important. Where to find more information:  [employer] on Mental Illness: ? www.nami.[place]: ? https://carter.com/  National Suicide Prevention Lifeline: ? 857-320-9045. This is free, 24-hour help. Contact a doctor if:  Your symptoms get worse.  You have new symptoms. Get help right away if:  You self-harm.  You see, hear, taste, smell, or feel things that are not present (hallucinate). If you ever feel like you may hurt yourself or others, or have thoughts about taking your own life, get help right away. You can go to your nearest emergency department or call:  Your local emergency services (911 in the [place]  A suicide crisis helpline, such as the National Suicide Prevention Lifeline: ? 732-407-9068. This is open 24 hours a day. This information is not intended to replace advice given to you by your health care provider. Make sure you discuss any questions you have with your health care provider. Document Revised: 08/05/2017 Document Reviewed: 05/09/2016 Elsevier Patient Education  2020 [employer].

## 2019-12-18 ENCOUNTER — Other Ambulatory Visit: Payer: Self-pay | Admitting: Family Medicine

## 2019-12-18 DIAGNOSIS — E785 Hyperlipidemia, unspecified: Secondary | ICD-10-CM

## 2019-12-18 DIAGNOSIS — N184 Chronic kidney disease, stage 4 (severe): Secondary | ICD-10-CM

## 2019-12-18 DIAGNOSIS — T8611 Kidney transplant rejection: Secondary | ICD-10-CM

## 2019-12-18 DIAGNOSIS — D631 Anemia in chronic kidney disease: Secondary | ICD-10-CM

## 2019-12-19 ENCOUNTER — Telehealth: Payer: Self-pay

## 2019-12-19 NOTE — Telephone Encounter (Signed)
Tell her to use good [employer] and get a coupon to get it cheaper

## 2019-12-19 NOTE — Telephone Encounter (Signed)
Spoke with patient. Pt states that her insurance doesn't cover the Prozac. Pt states she did not want to pay $84 for this med.

## 2019-12-20 NOTE — Telephone Encounter (Signed)
Left VM to call back 

## 2019-12-21 ENCOUNTER — Telehealth: Payer: Self-pay

## 2019-12-21 ENCOUNTER — Telehealth: Payer: Self-pay | Admitting: Gastroenterology

## 2019-12-21 NOTE — Telephone Encounter (Signed)
We did not talk about her feet at her appointment --- - will need appointment-- -virtual ok so I know exactly what we are referral for

## 2019-12-21 NOTE — Telephone Encounter (Signed)
Patient called in to let Dr. Etter Sjogren know that her feet is bothering her really bad and the patient wants a referral to a foot specialist. Please call the patient back at [phone]

## 2019-12-21 NOTE — Telephone Encounter (Signed)
Do you have a preference? Doesn't look like [facility] [facility] will be available anytime soon.

## 2019-12-21 NOTE — Telephone Encounter (Signed)
Pt would like to know if Dr. Havery Moros would recommend that pt have the Covid-19 vaccine? If so, which version?

## 2019-12-21 NOTE — Telephone Encounter (Signed)
Either if the vaccines available would be fine, no preference at all, whichever one she can get. Thanks

## 2019-12-24 NOTE — Telephone Encounter (Signed)
Called the patient informed of PCP instructions. She has scheduled an OV for Thursday 12/27/2019 at 10:40 AM to discuss problems with feet/and referral.

## 2019-12-24 NOTE — Telephone Encounter (Signed)
Called left message to call back 

## 2019-12-24 NOTE — Telephone Encounter (Signed)
Patient notified

## 2019-12-24 NOTE — Telephone Encounter (Signed)
Spoke to the patient today 12/24/19 regarding this medication/cost. Gave her PCP instructions regarding good rx. She agreed to look into this and will let PCP know how it goes. The patient verbalized understanding/had no other questions or concerns

## 2019-12-26 ENCOUNTER — Other Ambulatory Visit: Payer: Self-pay

## 2019-12-27 ENCOUNTER — Encounter: Payer: Self-pay | Admitting: Family Medicine

## 2019-12-27 ENCOUNTER — Ambulatory Visit (INDEPENDENT_AMBULATORY_CARE_PROVIDER_SITE_OTHER): Payer: Medicare Other | Admitting: Family Medicine

## 2019-12-27 VITALS — BP 128/78 | HR 99 | Temp 97.5°F | Resp 18 | Ht 63.0 in | Wt 200.2 lb

## 2019-12-27 DIAGNOSIS — M79672 Pain in left foot: Secondary | ICD-10-CM | POA: Diagnosis not present

## 2019-12-27 DIAGNOSIS — M79671 Pain in right foot: Secondary | ICD-10-CM

## 2019-12-27 DIAGNOSIS — G5793 Unspecified mononeuropathy of bilateral lower limbs: Secondary | ICD-10-CM | POA: Diagnosis not present

## 2019-12-27 MED ORDER — GABAPENTIN 100 MG PO CAPS: 100.0000 mg | ORAL_CAPSULE | Freq: Three times a day (TID) | ORAL | 3 refills | Status: DC

## 2019-12-27 NOTE — Patient Instructions (Signed)
Morton Neuralgia  Morton neuralgia is foot pain that affects the ball of the foot and the area near the toes. Morton neuralgia occurs when part of a nerve in the foot (digital nerve) is under too much pressure (compressed). When this happens over a long period of time, the nerve can thicken (neuroma) and cause pain. Pain usually occurs between the third and fourth toes.  Morton neuralgia can come and go but may get worse over time. What are the causes? This condition is caused by doing the same things over and over with your foot, such as:  Activities such as running or jumping.  Wearing shoes that are too tight. What increases the risk? You may be at higher risk for Morton neuralgia if you:  Are female.  Wear high heels.  Wear shoes that are narrow or tight.  Do activities that repeatedly stretch your toes, such as: ? Running. ? Ballet. ? Long-distance walking. What are the signs or symptoms? The first symptom of Morton neuralgia is pain that spreads from the ball of the foot to the toes. It may feel like you are walking on a marble. Pain usually gets worse with walking and goes away at night. Other symptoms may include numbness and cramping of your toes. Both feet are equally affected, but rarely at the same time. How is this diagnosed? This condition is diagnosed based on your symptoms, your medical history, and a physical exam. Your health care provider may:  Squeeze your foot just behind your toe.  Ask you to move your toes to check for pain.  Ask about your physical activity level. You also may have imaging tests, such as an X-ray, ultrasound, or MRI. How is this treated? Treatment depends on how severe your condition is and what causes it. Treatment may involve:  Wearing different shoes that are not too tight, are low-heeled, and provide good support. For some people, this is the only treatment needed.  Wearing an over-the-counter or custom supportive pad (orthotic)  under the front of your foot.  Getting injections of numbing medicine and anti-inflammatory medicine (steroid) in the nerve.  Having surgery to remove part of the thickened nerve. Follow these instructions at home: Managing pain, stiffness, and swelling   Massage your foot as needed.  Wear orthotics as told by your health care provider.  If directed, put ice on your foot: ? Put ice in a plastic bag. ? Place a towel between your skin and the bag. ? Leave the ice on for 20 minutes, 2-3 times a day.  Avoid activities that cause pain or make pain worse. If you play sports, ask your health care provider when it is safe for you to return to sports.  Raise (elevate) your foot above the level of your heart while lying down and, when possible, while sitting. General instructions  Take over-the-counter and prescription medicines only as told by your health care provider.  Do not drive or use heavy machinery while taking prescription pain medicine.  Wear shoes that: ? Have soft soles. ? Have a wide toe area. ? Provide arch support. ? Do not pinch or squeeze your feet. ? Have room for your orthotics, if applicable.  Keep all follow-up visits as told by your health care provider. This is important. Contact a health care provider if:  Your symptoms get worse or do not get better with treatment and home care. Summary  Morton neuralgia is foot pain that affects the ball of the foot and the   area near the toes. Pain usually occurs between the third and fourth toes, gets worse with walking, and goes away at night.  Morton neuralgia occurs when part of a nerve in the foot (digital nerve) is under too much pressure. When this happens over a long period of time, the nerve can thicken (neuroma) and cause pain.  This condition is caused by doing the same things over and over with your foot, such as running or jumping, wearing shoes that are too tight, or wearing high heels.  Treatment may  involve wearing low-heeled shoes that are not too tight, wearing a supportive pad (orthotic) under the front of your foot, getting injections in the nerve, or having surgery to remove part of the thickened nerve. This information is not intended to replace advice given to you by your health care provider. Make sure you discuss any questions you have with your health care provider. Document Revised: 09/06/2017 Document Reviewed: 09/06/2017 Elsevier Patient Education  2020 [employer]  

## 2019-12-27 NOTE — Progress Notes (Signed)
Patient ID: [name], female    DOB: 1956/03/21  Age: 64 y.o. MRN: [id number]    Subjective:  Subjective  HPI [name] presents for b/l foot pain and numbness.  It started with the R foot and now is in L foot    Pt states she feels like she is walking on rocks   It is causing her to lose balance some-- she has not fallen because if it   Review of Systems  Constitutional: Negative for appetite change, diaphoresis, fatigue and unexpected weight change.  Eyes: Negative for pain, redness and visual disturbance.  Respiratory: Negative for cough, chest tightness, shortness of breath and wheezing.   Cardiovascular: Negative for chest pain, palpitations and leg swelling.  Endocrine: Negative for cold intolerance, heat intolerance, polydipsia, polyphagia and polyuria.  Genitourinary: Negative for difficulty urinating, dysuria and frequency.  Neurological: Positive for numbness. Negative for dizziness, weakness, light-headedness and headaches.    History Past Medical History:  Diagnosis Date  . Abdominal pain    Hx  . Allergy   . Anal stenosis   . Anemia   . Anxiety   . Arthritis   . Asthma    patient does not have inhaler  . Blood in stool    Hx  . Blood in urine    Hx  . Blood transfusion without reported diagnosis   . CKD (chronic kidney disease) stage 3, GFR 30-59 ml/min   . Crohn's colitis, other complication ([place])   . De Quervain's tenosynovitis   . Depression   . Difficulty urinating    Hx  . Easy bruising   . Esophagitis   . Fainting    History - resolved - due to dehydration  . Fatigue    Hx  . Fibroid    Hx  . Gastric polyp   . GERD (gastroesophageal reflux disease)   . 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 Hemorrhoids, external   . Hemorrhoids, internal   . Herpes, genital    vaginal treated 07/05/12 and pt states is resolved  . History of cervical dysplasia   . History of small bowel obstruction   . Hyperlipidemia    currently no meds  . Hyperparathyroidism   . Hypertension   . Hypokalemia    Hx  . 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 Obesity   . Ovarian cyst   . Poor dental hygiene   . Pulmonary nodule, right    right upper lobe  . Rectal bleeding    Hx  . Rectal pain    Hx  . Renal insufficiency    CKD - stage 3  . RLS (restless legs syndrome)    no meds  . Tooth infection 11/2016   right low  . Ulcerative colitis   . Visual disturbance    wears glasses  . Weakness generalized    Hx - patient denies generalized weakness  . Wears dentures    upper only    She has a past surgical history that includes Anal dilation; Restorative proctocolectomy; Ileostomy closure; Cholecystectomy; Tubal ligation; Hemorrhoid surgery; fatty tumor removed from back; Cervical biopsy w/ loop electrode excision; Upper gastrointestinal endoscopy; Colonoscopy; Sigmoidoscopy; Total abdominal hysterectomy (1998); and Shoulder arthroscopy with capsulorrhaphy (Left, 06/14/2019).   Her family history includes Cancer in her maternal uncle and sister; Diabetes in  her sister; Heart attack in her father; Heart disease in her mother; Hypertension in her father and mother; Irritable bowel syndrome in an other family member; Ulcerative colitis in her daughter and father.She reports that she quit smoking about 40 years ago. Her smoking use included cigarettes. She has a 4.00 pack-year smoking history. She has never used smokeless tobacco. She reports that she does not drink alcohol or use drugs.  Current Outpatient Medications on File Prior to Visit  Medication Sig Dispense Refill  . allopurinol (ZYLOPRIM) 300 MG tablet TAKE 1 TABLET BY MOUTH EVERY DAY 30 tablet 1  . AMBULATORY NON FORMULARY MEDICATION Medication Name: Nitroglycerine ointment 0.125 %  Apply a pea sized amount internally four times daily. Dispense 30 GM zero refill  (Patient taking differently: Medication Name: Nitroglycerine ointment 0.125 %  Apply a pea sized amount internally four times daily-PRN Dispense 30 GM zero refill) 30 g 3  . Biotin 5000 MCG TABS Take 5,000 mg by mouth daily.     . calcium carbonate (TUMS - DOSED IN MG ELEMENTAL CALCIUM) 500 MG chewable tablet Chew 1 tablet by mouth daily as needed for indigestion or heartburn.    . DULoxetine (CYMBALTA) 60 MG capsule Take 1 capsule (60 mg total) by mouth daily. 30 capsule 3  . Ergocalciferol (VITAMIN D2) 2000 units TABS Take 2,000 Units by mouth every 30 (thirty) days.     . famotidine (PEPCID) 20 MG tablet Take 1 tablet (20 mg total) by mouth 2 (two) times daily. 180 tablet 3  . FLUoxetine (PROZAC) 20 MG tablet Take 1 tablet (20 mg total) by mouth daily. 30 tablet 3  . folic acid (FOLVITE) 1 MG tablet Take 2 tablets (2 mg total) by mouth daily. 180 tablet 3  . hydrALAZINE (APRESOLINE) 50 MG tablet Take 50 mg by mouth 3 (three) times daily.     Marland Kitchen loperamide (LOPERAMIDE A-D) 2 MG tablet Take 0.5 tablets (1 mg total) by mouth daily. (Patient taking differently: Take 1 mg by mouth at bedtime. ) 180 tablet 3  . metoprolol succinate (TOPROL-XL) 100 MG 24 hr tablet Take 50 mg by mouth 2 (two) times daily.     . metroNIDAZOLE (FLAGYL) 500 MG tablet Take 1 tablet (500 mg total) by mouth 2 (two) times daily. 28 tablet 0  . simethicone (GAS-X) 80 MG chewable tablet Chew 80 mg by mouth daily as needed [employer]).     Marland Kitchen tinidazole (TINDAMAX) 500 MG tablet Take 1 tablet (500 mg total) by mouth 2 (two) times daily. 60 tablet 1  . vedolizumab (ENTYVIO) 300 MG injection Inject 300 mg into the vein every 30 (thirty) days.     No current facility-administered medications on file prior to visit.     Objective:  Objective  Physical Exam Vitals and nursing note reviewed.  Constitutional:      Appearance: She is well-developed.  HENT:     Head: Normocephalic and atraumatic.  Eyes:     Conjunctiva/sclera:  Conjunctivae normal.  Neck:     Thyroid: No thyromegaly.     Vascular: No carotid bruit or JVD.  Cardiovascular:     Rate and Rhythm: Normal rate and regular rhythm.     Heart sounds: Normal heart sounds. No murmur.  Pulmonary:     Effort: Pulmonary effort is normal. No respiratory distress.     Breath sounds: Normal breath sounds. No wheezing or rales.  Chest:     Chest wall: No tenderness.  Musculoskeletal:  Cervical back: Normal range of motion and neck supple.  Neurological:     Mental Status: She is alert and oriented to person, place, and time.     Sensory: Sensory deficit present.     Comments: numbness both feet on bottom      BP 128/78 (BP Location: Right Arm, Patient Position: Sitting, Cuff Size: Large)   Pulse 99   Temp (!) 97.5 F (36.4 C) (Temporal)   Resp 18   Ht 5' 3"  (1.6 m)   Wt 200 lb 3.2 oz (90.8 kg)   SpO2 98%   BMI 35.46 kg/m  Wt Readings from Last 3 Encounters:  12/27/19 200 lb 3.2 oz (90.8 kg)  12/17/19 206 lb (93.4 kg)  06/14/19 195 lb (88.5 kg)     Lab Results  Component Value Date   WBC 8.2 12/17/2019   HGB 9.7 (L) 12/17/2019   HCT 29.6 (L) 12/17/2019   PLT 307.0 12/17/2019   GLUCOSE 136 (H) 12/17/2019   CHOL 208 (H) 12/17/2019   TRIG 223.0 (H) 12/17/2019   HDL 44.40 12/17/2019   LDLDIRECT 108.0 12/17/2019   LDLCALC 122 (H) 11/22/2014   ALT 13 12/17/2019   AST 19 12/17/2019   NA 138 12/17/2019   K 3.6 12/17/2019   CL 102 12/17/2019   CREATININE 2.12 (H) 12/17/2019   BUN 36 (H) 12/17/2019   CO2 26 12/17/2019   TSH 2.624 01/15/2017   INR 0.95 09/02/2017    CT SHOULDER LEFT WO CONTRAST  Result Date: 06/13/2019 CLINICAL DATA:  Left shoulder fracture.  Status post dislocation. EXAM: CT OF THE UPPER LEFT EXTREMITY WITHOUT CONTRAST TECHNIQUE: Multidetector CT imaging of the upper left extremity was performed according to the standard protocol. COMPARISON:  None. FINDINGS: Bones/Joint/Cartilage Acute comminuted fracture of the  anterior inferior glenoid with 7 mm of medial displacement and 10 mm of anterior displacement. 10 mm displaced bony fragment is located in the axillary recess. Fracture encompasses approximately 30% of the circumference. Moderate glenohumeral joint effusion. Flattening of the superior posterolateral humeral head consistent with a Hill-Sachs lesion from prior dislocation. No other acute fracture or dislocation. No aggressive osseous lesion. Moderate arthropathy of the acromioclavicular joint. No significant arthropathy of the glenohumeral joint. Ligaments Ligaments are suboptimally evaluated by CT. Muscles and Tendons Muscles are normal. No muscle atrophy. No intramuscular fluid collection or hematoma. Soft tissue No fluid collection or hematoma. No soft tissue mass. Visualized left lung is clear. IMPRESSION: 1. Acute comminuted fracture of the anterior inferior glenoid with 7 mm of medial displacement and 10 mm of anterior displacement. 10 mm displaced bony fragment is located in the axillary recess. Flattening of the superior posterolateral humeral head consistent with a Hill-Sachs lesion. Moderate glenohumeral joint effusion. Overall findings consistent with prior anterior shoulder dislocation. Electronically Signed   By: Kathreen Devoid   On: 06/13/2019 16:33     Assessment & Plan:  Plan  I am having Lavergne A. Nemmers start on gabapentin. I am also having her maintain her Biotin, folic acid, simethicone, Vitamin D2, AMBULATORY NON FORMULARY MEDICATION, metoprolol succinate, hydrALAZINE, tinidazole, loperamide, famotidine, Entyvio, calcium carbonate, metroNIDAZOLE, allopurinol, DULoxetine, and FLUoxetine.  Meds ordered this encounter  Medications  . gabapentin (NEURONTIN) 100 MG capsule    Sig: Take 1 capsule (100 mg total) by mouth 3 (three) times daily.    Dispense:  90 capsule    Refill:  3    Problem List Items Addressed This Visit    None    Visit  Diagnoses    Foot pain, bilateral    -  Primary    Relevant Medications   gabapentin (NEURONTIN) 100 MG capsule   Other Relevant Orders   Ambulatory referral to Podiatry   Neuropathy of both feet       Relevant Medications   gabapentin (NEURONTIN) 100 MG capsule    pt will start with 1 gabepentin at night for a few days and then bid for a few days and can inc to tid if needed  Refer to podiatry --- ? Neuroma   Follow-up: Return if symptoms worsen or fail to improve.  Ann Held, DO

## 2020-01-01 DIAGNOSIS — D509 Iron deficiency anemia, unspecified: Secondary | ICD-10-CM | POA: Diagnosis not present

## 2020-01-01 DIAGNOSIS — D631 Anemia in chronic kidney disease: Secondary | ICD-10-CM | POA: Diagnosis not present

## 2020-01-01 DIAGNOSIS — N179 Acute kidney failure, unspecified: Secondary | ICD-10-CM | POA: Diagnosis not present

## 2020-01-01 DIAGNOSIS — Z6831 Body mass index (BMI) 31.0-31.9, adult: Secondary | ICD-10-CM | POA: Diagnosis not present

## 2020-01-01 DIAGNOSIS — I129 Hypertensive chronic kidney disease with stage 1 through stage 4 chronic kidney disease, or unspecified chronic kidney disease: Secondary | ICD-10-CM | POA: Diagnosis not present

## 2020-01-01 DIAGNOSIS — N2581 Secondary hyperparathyroidism of renal origin: Secondary | ICD-10-CM | POA: Diagnosis not present

## 2020-01-01 DIAGNOSIS — M109 Gout, unspecified: Secondary | ICD-10-CM | POA: Diagnosis not present

## 2020-01-01 DIAGNOSIS — F329 Major depressive disorder, single episode, unspecified: Secondary | ICD-10-CM | POA: Diagnosis not present

## 2020-01-01 DIAGNOSIS — R768 Other specified abnormal immunological findings in serum: Secondary | ICD-10-CM | POA: Diagnosis not present

## 2020-01-01 DIAGNOSIS — N1831 Chronic kidney disease, stage 3a: Secondary | ICD-10-CM | POA: Diagnosis not present

## 2020-01-01 DIAGNOSIS — N184 Chronic kidney disease, stage 4 (severe): Secondary | ICD-10-CM | POA: Diagnosis not present

## 2020-01-01 DIAGNOSIS — E876 Hypokalemia: Secondary | ICD-10-CM | POA: Diagnosis not present

## 2020-01-03 ENCOUNTER — Other Ambulatory Visit: Payer: Self-pay | Admitting: Family

## 2020-01-03 ENCOUNTER — Telehealth: Payer: Self-pay | Admitting: Family

## 2020-01-03 DIAGNOSIS — D500 Iron deficiency anemia secondary to blood loss (chronic): Secondary | ICD-10-CM

## 2020-01-03 NOTE — Telephone Encounter (Signed)
Appointments added per 4/29 secure chat.  Patient was ok with adding appts

## 2020-01-04 ENCOUNTER — Inpatient Hospital Stay: Payer: Medicare Other

## 2020-01-04 ENCOUNTER — Other Ambulatory Visit: Payer: Self-pay | Admitting: Family

## 2020-01-04 ENCOUNTER — Inpatient Hospital Stay (HOSPITAL_BASED_OUTPATIENT_CLINIC_OR_DEPARTMENT_OTHER): Payer: Medicare Other | Admitting: Family

## 2020-01-04 ENCOUNTER — Inpatient Hospital Stay: Payer: Medicare Other | Attending: Hematology & Oncology

## 2020-01-04 ENCOUNTER — Encounter: Payer: Self-pay | Admitting: Family

## 2020-01-04 ENCOUNTER — Other Ambulatory Visit: Payer: Self-pay

## 2020-01-04 VITALS — BP 130/84 | HR 100 | Temp 97.1°F | Resp 20 | Ht 63.0 in | Wt 198.4 lb

## 2020-01-04 DIAGNOSIS — D631 Anemia in chronic kidney disease: Secondary | ICD-10-CM | POA: Diagnosis not present

## 2020-01-04 DIAGNOSIS — D500 Iron deficiency anemia secondary to blood loss (chronic): Secondary | ICD-10-CM

## 2020-01-04 DIAGNOSIS — K51918 Ulcerative colitis, unspecified with other complication: Secondary | ICD-10-CM | POA: Insufficient documentation

## 2020-01-04 DIAGNOSIS — D6862 Lupus anticoagulant syndrome: Secondary | ICD-10-CM | POA: Diagnosis not present

## 2020-01-04 DIAGNOSIS — N184 Chronic kidney disease, stage 4 (severe): Secondary | ICD-10-CM | POA: Diagnosis not present

## 2020-01-04 LAB — CBC WITH DIFFERENTIAL (CANCER CENTER ONLY)
Abs Immature Granulocytes: 0.05 10*3/uL (ref 0.00–0.07)
Basophils Absolute: 0.1 10*3/uL (ref 0.0–0.1)
Basophils Relative: 1 %
Eosinophils Absolute: 0.5 10*3/uL (ref 0.0–0.5)
Eosinophils Relative: 4 %
HCT: 35.1 % — ABNORMAL LOW (ref 36.0–46.0)
Hemoglobin: 11.2 g/dL — ABNORMAL LOW (ref 12.0–15.0)
Immature Granulocytes: 0 %
Lymphocytes Relative: 29 %
Lymphs Abs: 3.2 10*3/uL (ref 0.7–4.0)
MCH: 31.9 pg (ref 26.0–34.0)
MCHC: 31.9 g/dL (ref 30.0–36.0)
MCV: 100 fL (ref 80.0–100.0)
Monocytes Absolute: 0.8 10*3/uL (ref 0.1–1.0)
Monocytes Relative: 7 %
Neutro Abs: 6.6 10*3/uL (ref 1.7–7.7)
Neutrophils Relative %: 59 %
Platelet Count: 357 10*3/uL (ref 150–400)
RBC: 3.51 MIL/uL — ABNORMAL LOW (ref 3.87–5.11)
RDW: 14.9 % (ref 11.5–15.5)
WBC Count: 11.1 10*3/uL — ABNORMAL HIGH (ref 4.0–10.5)
nRBC: 0 % (ref 0.0–0.2)

## 2020-01-04 LAB — SAMPLE TO BLOOD BANK

## 2020-01-04 MED ORDER — SODIUM CHLORIDE 0.9 % IV SOLN
INTRAVENOUS | Status: DC
  Filled 2020-01-04: qty 250

## 2020-01-04 MED ORDER — SODIUM CHLORIDE 0.9 % IV SOLN
510.0000 mg | Freq: Once | INTRAVENOUS | Status: DC
  Administered 2020-01-04: 510 mg via INTRAVENOUS
  Filled 2020-01-04: qty 510

## 2020-01-04 NOTE — Progress Notes (Signed)
Hematology and Oncology Follow Up Visit  [name] [id number] August 18, 1956 64 y.o. 01/04/2020   Principle Diagnosis:  Recurrent iron deficiency anemia secondary to ulcerative colitis Right cephalic/basilic vein thrombus - transiently positive lupus anticoagulant  Current Therapy:        IV iron as indicated    Interim History:  [name] is here today for follow-up and IV iron. We last saw her in December 2019. She had lab work drawn earlier this month which showed an iron saturation of 13% and ferritin 59.  Hgb today is 11.2, MCV 100.  She had an infection in her J pouch earlier in the month. She is not sure if there has been blood in her stool. She has chronic diarrhea.  No obvious blood loss. No bruising or petechiae.  She is symptomatic with fatigue and SOB with exertion.  No fever, chills, n/v, cough, rash, dizziness, SOB, chest pain, palpitations or abdominal pain.  She states that her nephrologist just diagnosed her with stage IV chronic kidney disease. She urinates a lot at night.  No swelling in her extremities.  She states that she does have nerve pain in both feet and has an appointment coming up with an orthopedist.   No falls or syncopal episodes to report.  She has a good appetite and is staying well hydrated. Her weight is stable.   ECOG Performance Status: 1 - Symptomatic but completely ambulatory  Medications:  Allergies as of 01/04/2020      Reactions   Sulfa Antibiotics Hives   Morphine Other (See Comments)   Gives me crazy dreams Gives me crazy dreams   Sulfonamide Derivatives Itching, Swelling, Rash      Medication List       Accurate as of January 04, 2020  9:16 AM. If you have any questions, ask your nurse or doctor.        allopurinol 300 MG tablet Commonly known as: ZYLOPRIM TAKE 1 TABLET BY MOUTH EVERY DAY   AMBULATORY NON FORMULARY MEDICATION Medication Name: Nitroglycerine ointment 0.125 %  Apply a pea sized amount internally four times  daily. Dispense 30 GM zero refill What changed: additional instructions   Biotin 5000 MCG Tabs Take 5,000 mg by mouth daily.   calcium carbonate 500 MG chewable tablet Commonly known as: TUMS - dosed in mg elemental calcium Chew 1 tablet by mouth daily as needed for indigestion or heartburn.   DULoxetine 60 MG capsule Commonly known as: Cymbalta Take 1 capsule (60 mg total) by mouth daily.   Entyvio 300 MG injection Generic drug: vedolizumab Inject 300 mg into the vein every 30 (thirty) days.   famotidine 20 MG tablet Commonly known as: Pepcid Take 1 tablet (20 mg total) by mouth 2 (two) times daily.   FLUoxetine 20 MG tablet Commonly known as: PROZAC Take 1 tablet (20 mg total) by mouth daily.   folic acid 1 MG tablet Commonly known as: FOLVITE Take 2 tablets (2 mg total) by mouth daily.   gabapentin 100 MG capsule Commonly known as: NEURONTIN Take 1 capsule (100 mg total) by mouth 3 (three) times daily.   Gas-X 80 MG chewable tablet Generic drug: simethicone Chew 80 mg by mouth daily as needed [name]).   hydrALAZINE 50 MG tablet Commonly known as: APRESOLINE Take 50 mg by mouth 3 (three) times daily.   loperamide 2 MG tablet Commonly known as: Loperamide A-D Take 0.5 tablets (1 mg total) by mouth daily. What changed: when to take this  metoprolol succinate 100 MG 24 hr tablet Commonly known as: TOPROL-XL Take 50 mg by mouth 2 (two) times daily.   metroNIDAZOLE 500 MG tablet Commonly known as: FLAGYL Take 1 tablet (500 mg total) by mouth 2 (two) times daily.   tinidazole 500 MG tablet Commonly known as: TINDAMAX Take 1 tablet (500 mg total) by mouth 2 (two) times daily.   Vitamin D2 50 MCG (2000 UT) Tabs Take 2,000 Units by mouth every 30 (thirty) days.       Allergies:  Allergies  Allergen Reactions  . Sulfa Antibiotics Hives  . Morphine Other (See Comments)    Gives me crazy dreams Gives me crazy dreams  . Sulfonamide Derivatives Itching,  Swelling and Rash    Past Medical History, Surgical history, Social history, and Family History were reviewed and updated.  Review of Systems: All other 10 point review of systems is negative.   Physical Exam:  vitals were not taken for this visit.   Wt Readings from Last 3 Encounters:  12/27/19 200 lb 3.2 oz (90.8 kg)  12/17/19 206 lb (93.4 kg)  06/14/19 195 lb (88.5 kg)    Ocular: Sclerae unicteric, pupils equal, round and reactive to light Ear-nose-throat: Oropharynx clear, dentition fair Lymphatic: No cervical or supraclavicular adenopathy Lungs no rales or rhonchi, good excursion bilaterally Heart regular rate and rhythm, no murmur appreciated Abd soft, nontender, positive bowel sounds, no liver or spleen tip palpated on exam, no fluid wave  MSK no focal spinal tenderness, no joint edema Neuro: non-focal, well-oriented, appropriate affect Breasts: Deferred   Lab Results  Component Value Date   WBC 8.2 12/17/2019   HGB 9.7 (L) 12/17/2019   HCT 29.6 (L) 12/17/2019   MCV 99.7 12/17/2019   PLT 307.0 12/17/2019   Lab Results  Component Value Date   FERRITIN 59.9 12/17/2019   IRON 49 12/17/2019   TIBC 319 08/16/2018   UIBC 253 08/16/2018   IRONPCTSAT 13.1 (L) 12/17/2019   Lab Results  Component Value Date   RETICCTPCT 2.4 08/16/2018   RBC 2.97 (L) 12/17/2019   No results found for: KPAFRELGTCHN, LAMBDASER, KAPLAMBRATIO No results found for: IGGSERUM, IGA, IGMSERUM No results found for: Odetta Pink, SPEI   Chemistry      Component Value Date/Time   NA 138 12/17/2019 1156   NA 142 09/08/2017 0923   K 3.6 12/17/2019 1156   K 3.7 09/08/2017 0923   CL 102 12/17/2019 1156   CL 97 (L) 09/08/2017 0923   CO2 26 12/17/2019 1156   CO2 28 09/08/2017 0923   BUN 36 (H) 12/17/2019 1156   BUN 49 (H) 09/08/2017 0923   CREATININE 2.12 (H) 12/17/2019 1156   CREATININE 2.38 (H) 08/16/2018 0745   CREATININE 3.1 (HH)  09/08/2017 0923      Component Value Date/Time   CALCIUM 8.8 12/17/2019 1156   CALCIUM 9.9 09/08/2017 0923   CALCIUM 8.9 04/12/2013 0851   ALKPHOS 87 12/17/2019 1156   ALKPHOS 89 (H) 09/08/2017 0923   AST 19 12/17/2019 1156   AST 15 08/16/2018 0745   ALT 13 12/17/2019 1156   ALT 12 08/16/2018 0745   ALT 22 09/08/2017 0923   BILITOT 0.3 12/17/2019 1156   BILITOT 0.3 08/16/2018 0745       Impression and Plan: Ms. Hannula is a very pleasant 64 yo caucasian female with iron deficiency anemia secondary to intermittent GI blood loss/malabsorption with UC.  She will get IV iron today  and possibly a second dose next week depending on today's lab results.  We will plan to see her back in another 4 months.  She will contact our office with any questions or concerns. We can certainly see her sooner if need be.   Laverna Peace, NP 4/30/20219:16 AM

## 2020-01-04 NOTE — Patient Instructions (Signed)

## 2020-01-07 ENCOUNTER — Telehealth: Payer: Self-pay | Admitting: Gastroenterology

## 2020-01-07 MED ORDER — METRONIDAZOLE 500 MG PO TABS
500.0000 mg | ORAL_TABLET | Freq: Two times a day (BID) | ORAL | 0 refills | Status: DC
Start: 2020-01-07 — End: 2020-02-19

## 2020-01-07 NOTE — Telephone Encounter (Signed)
Pt stated that she has an infection in her J pouch and requested antibiotics.  She would also like to discuss EGD flex.

## 2020-01-07 NOTE — Telephone Encounter (Signed)
Thanks Sheri she has had issues with recurrent pouchitis, I suspect that may be the cause. Let's give her flagyl 565m BID for 2 weeks, she has done well with that regimen in the past.   Otherwise she is overdue for pouchoscopy, can you help schedule that for her, ideally > 2 weeks out to allow her to finish antibiotics first. Otherwise, we had discussed EGD for reflux in the past if her symptoms persisted. If doing well on pepcid and no upper tract symptoms can hold off on that, however if reflux symptoms persist despite treatment can add on EGD. Thanks

## 2020-01-07 NOTE — Telephone Encounter (Signed)
Patient notified of the recommendations She will come for pre-visit, COVID screen, and procedure as scheduled She will pick up her rx at the pharmacy She is having daily reflux, requiring TUMs, and she is having dysphagia. We scheduled endo/flex

## 2020-01-07 NOTE — Telephone Encounter (Signed)
Patient reports that on Saturday she started having diarrhea and yesterday had some bloody diarrhea.  She started taking imodium yesterday.  She has slowed down.  She is wondering if she she needs treated for a j-pouch infection.  Is it okay to reschedule her endo/flex now or does she need an office visit ?

## 2020-01-08 ENCOUNTER — Other Ambulatory Visit: Payer: Self-pay | Admitting: Family Medicine

## 2020-01-08 ENCOUNTER — Other Ambulatory Visit: Payer: Self-pay

## 2020-01-08 ENCOUNTER — Telehealth: Payer: Self-pay | Admitting: Family Medicine

## 2020-01-08 DIAGNOSIS — F322 Major depressive disorder, single episode, severe without psychotic features: Secondary | ICD-10-CM

## 2020-01-08 NOTE — Telephone Encounter (Signed)
Spoke with pt regarding Duloxetine request for 2 daily dosing. She confirmed that dose was changed at 12/17/19 OV to once a day due to problems with her feet. Pt states she has enough supply on hand that RX is not needed at this time. Sent denial to pharmacy to DC twice daily rx and use 4/12 RX when refill is due.

## 2020-01-08 NOTE — Telephone Encounter (Signed)
Patient is requesting capsules for medication listed below. Patients that medication is too high as tablets.   FLUoxetine (PROZAC) 20 MG tablet   Patient would like a new medication as capusles sent to:  [facility]  [address] [place], [place], [place] [address]  [phone]

## 2020-01-08 NOTE — Telephone Encounter (Signed)
OK 

## 2020-01-08 NOTE — Telephone Encounter (Signed)
Pt requesting medication be changed to capsules from tablets due to cost. Okay to change? Please advise

## 2020-01-09 ENCOUNTER — Telehealth: Payer: Self-pay

## 2020-01-09 MED ORDER — FLUOXETINE HCL 20 MG PO CAPS
20.0000 mg | ORAL_CAPSULE | Freq: Every day | ORAL | 3 refills | Status: DC
Start: 2020-01-09 — End: 2020-02-07

## 2020-01-09 NOTE — Addendum Note (Signed)
Addended by: Sanda Linger on: 01/09/2020 09:22 AM   Modules accepted: Orders

## 2020-01-09 NOTE — Telephone Encounter (Signed)
Called patient. Left VM. VM states that we sent a refill in this morning to the pharmacy on file. Advised patient to call back if we needed to switch pharmacies.

## 2020-01-09 NOTE — Telephone Encounter (Signed)
Patient need a refill for FLUoxetine (PROZAC) 20 MG capsule [[id number]]    Please send it to [facility] [address] [place], [place], [place] [address]  Phone number:  [phone]

## 2020-01-09 NOTE — Telephone Encounter (Signed)
Medication switched

## 2020-01-10 ENCOUNTER — Other Ambulatory Visit: Payer: Self-pay | Admitting: Gastroenterology

## 2020-01-11 ENCOUNTER — Other Ambulatory Visit: Payer: Self-pay

## 2020-01-11 ENCOUNTER — Inpatient Hospital Stay: Payer: Medicare Other | Attending: Hematology & Oncology

## 2020-01-11 ENCOUNTER — Other Ambulatory Visit: Payer: Self-pay | Admitting: Family Medicine

## 2020-01-11 VITALS — BP 126/82 | HR 90 | Temp 97.0°F | Resp 17

## 2020-01-11 DIAGNOSIS — K51918 Ulcerative colitis, unspecified with other complication: Secondary | ICD-10-CM | POA: Insufficient documentation

## 2020-01-11 DIAGNOSIS — D500 Iron deficiency anemia secondary to blood loss (chronic): Secondary | ICD-10-CM | POA: Insufficient documentation

## 2020-01-11 MED ORDER — SODIUM CHLORIDE 0.9 % IV SOLN
510.0000 mg | Freq: Once | INTRAVENOUS | Status: AC
  Administered 2020-01-11: 510 mg via INTRAVENOUS
  Filled 2020-01-11: qty 510

## 2020-01-11 MED ORDER — SODIUM CHLORIDE 0.9 % IV SOLN
Freq: Once | INTRAVENOUS | Status: AC
  Filled 2020-01-11: qty 250

## 2020-01-11 NOTE — Progress Notes (Signed)
Pt declined to stay for 30 minute post infusion observation period. Pt stated she has been tolerating infusion for "years" Pt aware to call clinic with any questions. Pt verbalized understanding and had no further questions.  This RN discharged patient for another RN.

## 2020-01-14 ENCOUNTER — Ambulatory Visit: Payer: Medicare Other | Admitting: Family Medicine

## 2020-01-15 ENCOUNTER — Ambulatory Visit (AMBULATORY_SURGERY_CENTER): Payer: Self-pay | Admitting: *Deleted

## 2020-01-15 ENCOUNTER — Other Ambulatory Visit: Payer: Self-pay

## 2020-01-15 VITALS — Temp 96.9°F | Ht 63.0 in | Wt 200.0 lb

## 2020-01-15 DIAGNOSIS — K219 Gastro-esophageal reflux disease without esophagitis: Secondary | ICD-10-CM

## 2020-01-15 DIAGNOSIS — Z01818 Encounter for other preprocedural examination: Secondary | ICD-10-CM

## 2020-01-15 DIAGNOSIS — K5090 Crohn's disease, unspecified, without complications: Secondary | ICD-10-CM

## 2020-01-15 NOTE — Progress Notes (Signed)
No egg or soy allergy known to patient  No issues with past sedation with any surgeries  or procedures, no intubation problems  No diet pills per patient No home 02 use per patient  No blood thinners per patient  Pt denies issues with constipation  No A fib or A flutter  EMMI video sent to pt's e mail   Due to the COVID-19 pandemic we are asking patients to follow these guidelines. Please only bring one care partner. Please be aware that your care partner may wait in the car in the parking lot or if they feel like they will be too hot to wait in the car, they may wait in the lobby on the 4th floor. All care partners are required to wear a mask the entire time (we do not have any that we can provide them), they need to practice social distancing, and we will do a Covid check for all patient's and care partners when you arrive. Also we will check their temperature and your temperature. If the care partner waits in their car they need to stay in the parking lot the entire time and we will call them on their cell phone when the patient is ready for discharge so they can bring the car to the front of the building. Also all patient's will need to wear a mask into building.

## 2020-01-17 ENCOUNTER — Ambulatory Visit (INDEPENDENT_AMBULATORY_CARE_PROVIDER_SITE_OTHER): Payer: Medicare Other

## 2020-01-17 DIAGNOSIS — Z1159 Encounter for screening for other viral diseases: Secondary | ICD-10-CM

## 2020-01-18 LAB — SARS CORONAVIRUS 2 (TAT 6-24 HRS): SARS Coronavirus 2: NEGATIVE

## 2020-01-21 ENCOUNTER — Ambulatory Visit (AMBULATORY_SURGERY_CENTER): Payer: Medicare Other | Admitting: Gastroenterology

## 2020-01-21 ENCOUNTER — Other Ambulatory Visit: Payer: Self-pay

## 2020-01-21 ENCOUNTER — Encounter: Payer: Self-pay | Admitting: Gastroenterology

## 2020-01-21 VITALS — BP 115/78 | HR 85 | Temp 97.3°F | Resp 17 | Ht 63.0 in | Wt 200.0 lb

## 2020-01-21 DIAGNOSIS — K5000 Crohn's disease of small intestine without complications: Secondary | ICD-10-CM

## 2020-01-21 DIAGNOSIS — K91850 Pouchitis: Secondary | ICD-10-CM | POA: Diagnosis not present

## 2020-01-21 DIAGNOSIS — K219 Gastro-esophageal reflux disease without esophagitis: Secondary | ICD-10-CM | POA: Diagnosis not present

## 2020-01-21 DIAGNOSIS — I10 Essential (primary) hypertension: Secondary | ICD-10-CM | POA: Diagnosis not present

## 2020-01-21 DIAGNOSIS — K449 Diaphragmatic hernia without obstruction or gangrene: Secondary | ICD-10-CM | POA: Diagnosis not present

## 2020-01-21 DIAGNOSIS — K5989 Other specified functional intestinal disorders: Secondary | ICD-10-CM | POA: Diagnosis not present

## 2020-01-21 DIAGNOSIS — K6289 Other specified diseases of anus and rectum: Secondary | ICD-10-CM | POA: Diagnosis not present

## 2020-01-21 DIAGNOSIS — K317 Polyp of stomach and duodenum: Secondary | ICD-10-CM | POA: Diagnosis not present

## 2020-01-21 DIAGNOSIS — G2581 Restless legs syndrome: Secondary | ICD-10-CM | POA: Diagnosis not present

## 2020-01-21 DIAGNOSIS — D509 Iron deficiency anemia, unspecified: Secondary | ICD-10-CM | POA: Diagnosis not present

## 2020-01-21 MED ORDER — SODIUM CHLORIDE 0.9 % IV SOLN
500.0000 mL | Freq: Once | INTRAVENOUS | Status: DC
Start: 2020-01-21 — End: 2020-01-21

## 2020-01-21 NOTE — Op Note (Signed)
[place] Patient Name: [name] Procedure Date: 01/21/2020 7:55 AM MRN: [id number] Endoscopist: Remo Lipps P. Havery Moros , MD Age: 64 Referring MD:  Date of Birth: 1955/12/21 Gender: Female Account #: [id number] Procedure:                Upper GI endoscopy Indications:              Follow-up of gastro-esophageal reflux disease - on                            pepcid 24m BID and TUMS PRN - occasional                            breakthrough. Screening for BE - patient wants to                            avoid PPIs due to CKD, want to ensure no                            significant esophagitis or Barrett's Medicines:                Monitored Anesthesia Care Procedure:                Pre-Anesthesia Assessment:                           - Prior to the procedure, a History and Physical                            was performed, and patient medications and                            allergies were reviewed. The patient's tolerance of                            previous anesthesia was also reviewed. The risks                            and benefits of the procedure and the sedation                            options and risks were discussed with the patient.                            All questions were answered, and informed consent                            was obtained. Prior Anticoagulants: The patient has                            taken no previous anticoagulant or antiplatelet                            agents. ASA Grade Assessment: II - A patient with  mild systemic disease. After reviewing the risks                            and benefits, the patient was deemed in                            satisfactory condition to undergo the procedure.                           After obtaining informed consent, the endoscope was                            passed under direct vision. Throughout the                            procedure, the patient's blood  pressure, pulse, and                            oxygen saturations were monitored continuously. The                            Endoscope was introduced through the mouth, and                            advanced to the second part of duodenum. The upper                            GI endoscopy was accomplished without difficulty.                            The patient tolerated the procedure well. Scope In: Scope Out: Findings:                 Esophagogastric landmarks were identified: the                            Z-line was found at 33 cm, the gastroesophageal                            junction was found at 33 cm and the upper extent of                            the gastric folds was found at 36 cm from the                            incisors.                           A 3 cm hiatal hernia was present.                           LA Grade A esophagitis was found 33 cm from the  incisors - mild.                           The exam of the esophagus was otherwise normal. No                            Barrett's esophagus.                           Multiple small sessile polyps were found in the                            gastric fundus and in the gastric body, benign                            appearing, likely fundic gland polyps, a cluster                            was noted at the distal end of the hernia sac.                            Biopsies were taken with a cold forceps for                            histology.                           The exam of the stomach was otherwise normal.                           The duodenal bulb and second portion of the                            duodenum were normal. Complications:            No immediate complications. Estimated blood loss:                            Minimal. Estimated Blood Loss:     Estimated blood loss was minimal. Impression:               - Esophagogastric landmarks identified.                            - 3 cm hiatal hernia.                           - Very mild focal reflux esophagitis.                           - No Barrett's                           - Normal esophagus otherwise                           - Multiple gastric polyps, suspect fundic  gland                            benign polyps. Biopsied.                           - Normal duodenal bulb and second portion of the                            duodenum. Recommendation:           - Patient has a contact number available for                            emergencies. The signs and symptoms of potential                            delayed complications were discussed with the                            patient. Return to normal activities tomorrow.                            Written discharge instructions were provided to the                            patient.                           - Resume previous diet.                           - Continue present medications.                           - Await pathology results.                           - Given CKD, can continue to hold off on PPIs if                            that is preferred - no Barrett's noted. Continue                            pepcid and TUMS if symptoms fairly well controlled [name] P. Moo Gravley, MD 01/21/2020 8:44:36 AM This report has been signed electronically.

## 2020-01-21 NOTE — Progress Notes (Signed)
Report to PACU, RN, vss, BBS= Clear.  

## 2020-01-21 NOTE — Progress Notes (Signed)
Pt's states no medical or surgical changes since previsit or office visit. 

## 2020-01-21 NOTE — Progress Notes (Signed)
Called to room to assist during endoscopic procedure.  Patient ID and intended procedure confirmed with present staff. Received instructions for my participation in the procedure from the performing physician.  

## 2020-01-21 NOTE — Patient Instructions (Signed)
Discharge instructions given. Biopsies taken. Handouts on hiatal hernia and esophagitis. Resume previous medications. YOU HAD AN ENDOSCOPIC PROCEDURE TODAY AT [facility]:   Refer to the procedure report that was given to you for any specific questions about what was found during the examination.  If the procedure report does not answer your questions, please call your gastroenterologist to clarify.  If you requested that your care partner not be given the details of your procedure findings, then the procedure report has been included in a sealed envelope for you to review at your convenience later.  YOU SHOULD EXPECT: Some feelings of bloating in the abdomen. Passage of more gas than usual.  Walking can help get rid of the air that was put into your GI tract during the procedure and reduce the bloating. If you had a lower endoscopy (such as a colonoscopy or flexible sigmoidoscopy) you may notice spotting of blood in your stool or on the toilet paper. If you underwent a bowel prep for your procedure, you may not have a normal bowel movement for a few days.  Please Note:  You might notice some irritation and congestion in your nose or some drainage.  This is from the oxygen used during your procedure.  There is no need for concern and it should clear up in a day or so.  SYMPTOMS TO REPORT IMMEDIATELY:   Following lower endoscopy (colonoscopy or flexible sigmoidoscopy):  Excessive amounts of blood in the stool  Significant tenderness or worsening of abdominal pains  Swelling of the abdomen that is new, acute  Fever of 100F or higher   Following upper endoscopy (EGD)  Vomiting of blood or coffee ground material  New chest pain or pain under the shoulder blades  Painful or persistently difficult swallowing  New shortness of breath  Fever of 100F or higher  Black, tarry-looking stools  For urgent or emergent issues, a gastroenterologist can be reached at any hour by calling  [phone]. Do not use MyChart messaging for urgent concerns.    DIET:  We do recommend a small meal at first, but then you may proceed to your regular diet.  Drink plenty of fluids but you should avoid alcoholic beverages for 24 hours.  ACTIVITY:  You should plan to take it easy for the rest of today and you should NOT DRIVE or use heavy machinery until tomorrow (because of the sedation medicines used during the test).    FOLLOW UP: Our staff will call the number listed on your records 48-72 hours following your procedure to check on you and address any questions or concerns that you may have regarding the information given to you following your procedure. If we do not reach you, we will leave a message.  We will attempt to reach you two times.  During this call, we will ask if you have developed any symptoms of COVID 19. If you develop any symptoms (ie: fever, flu-like symptoms, shortness of breath, cough etc.) before then, please call [phone].  If you test positive for Covid 19 in the 2 weeks post procedure, please call and report this information to [place].    If any biopsies were taken you will be contacted by phone or by letter within the next 1-3 weeks.  Please call us at [phone] if you have not heard about the biopsies in 3 weeks.    SIGNATURES/CONFIDENTIALITY: You and/or your care partner have signed paperwork which will be entered into your electronic medical  record.  These signatures attest to the fact that that the information above on your After Visit Summary has been reviewed and is understood.  Full responsibility of the confidentiality of this discharge information lies with you and/or your care-partner.

## 2020-01-21 NOTE — Op Note (Signed)
[place] Patient Name: [name] Procedure Date: 01/21/2020 7:54 AM MRN: [id number] Endoscopist: Remo Lipps P. 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 Overall stable Medicines:                Monitored Anesthesia Care Procedure:                Pre-Anesthesia Assessment:                           - Prior to the procedure, a History and Physical                            was performed, and patient medications and                            allergies were reviewed. The patient's tolerance of                            previous anesthesia was also reviewed. The risks                            and benefits of the procedure and the sedation                            options and risks were discussed with the patient.                            All questions were answered, and informed consent                            was obtained. Prior Anticoagulants: The patient has                            taken no previous anticoagulant or antiplatelet                            agents. ASA Grade Assessment: II - A patient with  mild systemic disease. After reviewing the risks                            and benefits, the patient was deemed in                            satisfactory condition to undergo the procedure.                           After obtaining informed consent, the endoscope was                            passed under direct vision. Throughout the                            procedure, the patient's  blood pressure, pulse, and                            oxygen saturations were monitored continuously. The                            Endoscope was introduced through the anus and                            advanced to the the J-pouch. After obtaining                            informed consent, the endoscope was passed under                            direct vision. Throughout the procedure, the                            patient's blood pressure, pulse, and oxygen                            saturations were monitored continuously.The                            procedure was performed without difficulty. The                            patient tolerated the procedure well. The quality                            of the bowel preparation was poor. Scope In: Scope Out: Findings:                 The digital rectal exam findings include a                            suspected small perianal fistulous tract and anal                            canal stenosis.  The pouch had an angulated turn into the more                            proximal ileum which had a focal small area of                            ulceration, which is chronic compared to previous                            exam and stable in appearance. Very mild stenosis                            in this area but widely patent. The pouch and more                            proximal ileum otherwise appeared normal without                            any overt inflammatory changes. Biopsies were taken                            with a cold forceps for histology from the                            ulcerated area and pouch. There was a suspected                            small fistula at the distal pouch near the anal                            canal stenosis which is a new finding. Also small                            area of nodularity along the anal canal - biopsied                            to rule out AIN /  condyloma.                           A large amount of semi-solid stool was found in the                            pouch making visualization difficult - prep was                            inadequate. Complications:            No immediate complications. Estimated blood loss:                            Minimal. Estimated Blood Loss:     Estimated blood loss was minimal. Impression:               -  Preparation of the colon was poor.                           - A suspected small perianal fistulous tract and                            anal canal stenosis found on digital rectal exam.                           - The ileal pouch has an angulated turn with very                            focal ulcerations, otherwise no inflammatory                            changes noted. 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                           - Resume previous diet.                           - Continue present medications.                           - Await pathology results.                           - May need MRI pelvis and or surgical consult                            regarding fistula, will discuss with patient Carlota Raspberry. Jamera Vanloan, MD 01/21/2020 8:39:50 AM This report has been signed electronically.

## 2020-01-23 ENCOUNTER — Ambulatory Visit (INDEPENDENT_AMBULATORY_CARE_PROVIDER_SITE_OTHER): Payer: Medicare Other | Admitting: Podiatry

## 2020-01-23 ENCOUNTER — Telehealth: Payer: Self-pay | Admitting: *Deleted

## 2020-01-23 ENCOUNTER — Encounter: Payer: Self-pay | Admitting: Podiatry

## 2020-01-23 ENCOUNTER — Ambulatory Visit (INDEPENDENT_AMBULATORY_CARE_PROVIDER_SITE_OTHER): Payer: Medicare Other

## 2020-01-23 ENCOUNTER — Other Ambulatory Visit: Payer: Self-pay | Admitting: Podiatry

## 2020-01-23 ENCOUNTER — Other Ambulatory Visit: Payer: Self-pay

## 2020-01-23 DIAGNOSIS — I7300 Raynaud's syndrome without gangrene: Secondary | ICD-10-CM | POA: Diagnosis not present

## 2020-01-23 DIAGNOSIS — M79672 Pain in left foot: Secondary | ICD-10-CM | POA: Diagnosis not present

## 2020-01-23 DIAGNOSIS — M79671 Pain in right foot: Secondary | ICD-10-CM | POA: Diagnosis not present

## 2020-01-23 DIAGNOSIS — M2042 Other hammer toe(s) (acquired), left foot: Secondary | ICD-10-CM

## 2020-01-23 DIAGNOSIS — M2041 Other hammer toe(s) (acquired), right foot: Secondary | ICD-10-CM

## 2020-01-23 NOTE — Progress Notes (Signed)
Subjective:   Patient ID: [name], female   DOB: 64 y.o.   MRN: [id number]   HPI Patient states she gets burning pain at night and [name] seems to bother him more than the left and she feels like she is walking or not and she is noted color changes.  Patient does not smoke likes to be active if possible   Review of Systems  All other systems reviewed and are negative.       Objective:  Physical Exam Vitals and nursing note reviewed.  Constitutional:      Appearance: She is well-developed.  Pulmonary:     Effort: Pulmonary effort is normal.  Musculoskeletal:        General: Normal range of motion.  Skin:    General: Skin is warm.  Neurological:     Mental Status: She is alert.     Neurovascular status was found to be intact with moderate diminishment of sharp dull vibratory.  Range of motion was reduced subtalar midtarsal joint muscle strength reduced and patient has some redness of the lesser digits bilateral but did have good pulses that did not indicate vascular disease.  No history of claudication when questioned and moderate cavus foot structure      Assessment:  Probability that this is some form of Raynaud's or other condition which may be related to the endocrine system and does not appear to be related to vascular disease      Plan:  H&P x-rays reviewed with patient and at this point I have recommended soaks socks to be worn at all times and monitoring the condition.  She is on many medicines we do not want to try new medicines but ultimately may have to try gabapentin but will try to hold off currently and see if it gets better on its own  X-rays indicated arthritis but no indications of other bone pathology

## 2020-01-23 NOTE — Telephone Encounter (Signed)
  Follow up Call-  Call back number 01/21/2020  Post procedure Call Back phone  # [phone]  Permission to leave phone message Yes  Some recent data might be hidden     Patient questions:  Do you have a fever, pain , or abdominal swelling? No. Pain Score  0 *  Have you tolerated food without any problems? Yes.    Have you been able to return to your normal activities? Yes.    Do you have any questions about your discharge instructions: Diet   No. Medications  No. Follow up visit  No.  Do you have questions or concerns about your Care? No.  Actions: * If pain score is 4 or above: No action needed, pain <4.  1. Have you developed a fever since your procedure? no  2.   Have you had an respiratory symptoms (SOB or cough) since your procedure? no  3.   Have you tested positive for COVID 19 since your procedure no  4.   Have you had any family members/close contacts diagnosed with the COVID 19 since your procedure?  no   If yes to any of these questions please route to Joylene John, RN and Erenest Rasher, RN

## 2020-01-24 ENCOUNTER — Ambulatory Visit: Payer: Medicare Other | Admitting: Family Medicine

## 2020-01-24 DIAGNOSIS — Z0289 Encounter for other administrative examinations: Secondary | ICD-10-CM

## 2020-01-30 ENCOUNTER — Other Ambulatory Visit: Payer: Self-pay

## 2020-01-30 DIAGNOSIS — K603 Anal fistula: Secondary | ICD-10-CM

## 2020-02-07 ENCOUNTER — Other Ambulatory Visit: Payer: Self-pay | Admitting: Family Medicine

## 2020-02-11 DIAGNOSIS — H25013 Cortical age-related cataract, bilateral: Secondary | ICD-10-CM | POA: Diagnosis not present

## 2020-02-11 DIAGNOSIS — H2513 Age-related nuclear cataract, bilateral: Secondary | ICD-10-CM | POA: Diagnosis not present

## 2020-02-14 ENCOUNTER — Other Ambulatory Visit: Payer: Self-pay

## 2020-02-14 ENCOUNTER — Ambulatory Visit (HOSPITAL_COMMUNITY)
Admission: RE | Admit: 2020-02-14 | Discharge: 2020-02-14 | Disposition: A | Discharge status: Home / Self Care | Payer: Medicare Other | Source: Ambulatory Visit | Attending: Gastroenterology | Admitting: Gastroenterology

## 2020-02-14 DIAGNOSIS — K603 Anal fistula: Secondary | ICD-10-CM | POA: Insufficient documentation

## 2020-02-14 DIAGNOSIS — K5190 Ulcerative colitis, unspecified, without complications: Secondary | ICD-10-CM | POA: Diagnosis not present

## 2020-02-14 IMAGING — MR MR PELVIS WO/W CM
6 of 11 series · 28 of 48 positions shown · IV contrast (gadavist)
Comparison: None.

CLINICAL DATA: Perianal fistula. Pouch-itis. Ulcerative colitis.
Previous total colectomy with J-pouch.

EXAM:
MRI PELVIS WITHOUT AND WITH CONTRAST
TECHNIQUE: Multiplanar multisequence MR imaging of the pelvis was performed
both before and after administration of intravenous contrast.
CONTRAST:  9mL GADAVIST GADOBUTROL 1 MMOL/ML IV SOLN

[Series 2: T2 · sagittal · 2.5mm · 1.09mm/px · 6 of 43 slices shown (1 of 3)]
[im 1/43]
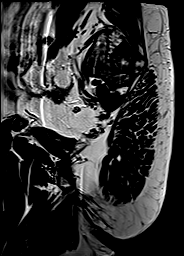
[im 9/43]
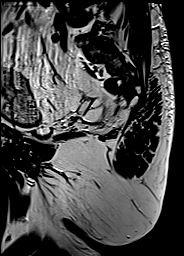
[im 17/43]
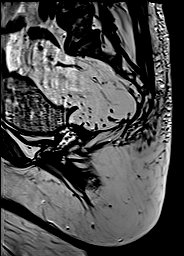
[im 26/43]
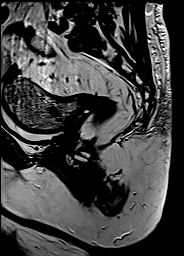
[im 34/43]
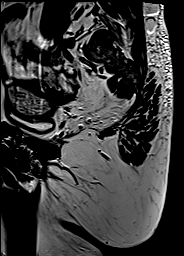
[im 43/43]
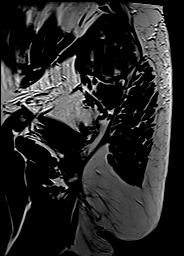

[Series 3: T2 fat-sat · sagittal · 2.5mm · 1.09mm/px · 6 of 43 slices shown (1 of 2)]
[im 1/43]
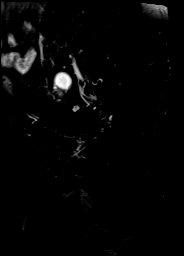
[im 9/43]
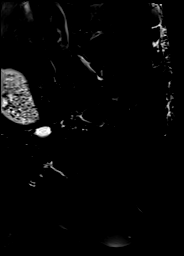
[im 17/43]
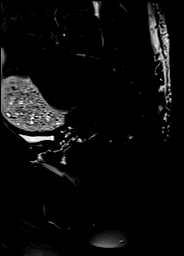
[im 26/43]
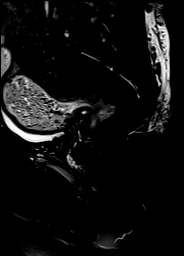
[im 34/43]
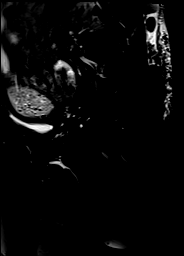
[im 43/43]
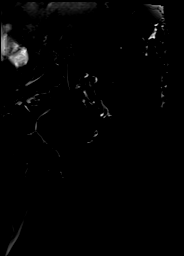

[Series 4: T2 · coronal · 5.0mm · 0.51mm/px · 4 of 34 slices shown (2 of 3)]
[im 1/34]
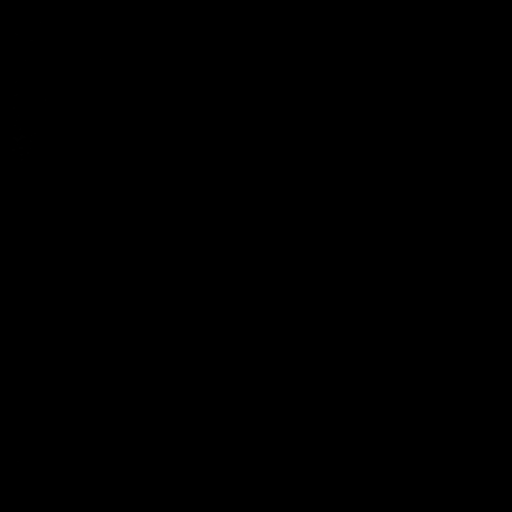
[im 12/34]
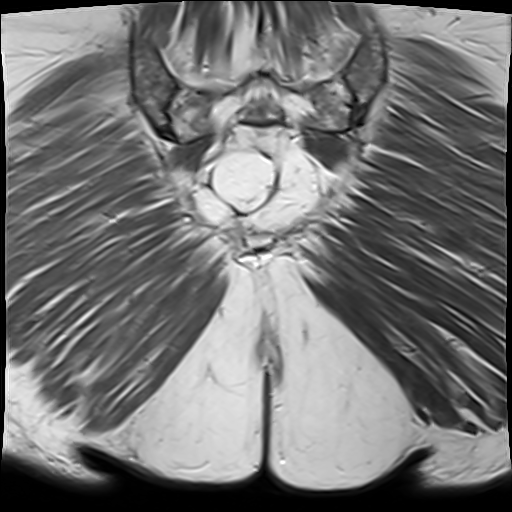
[im 23/34]
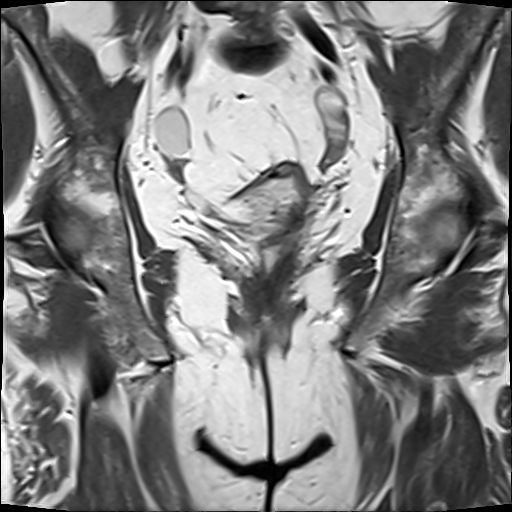
[im 34/34]
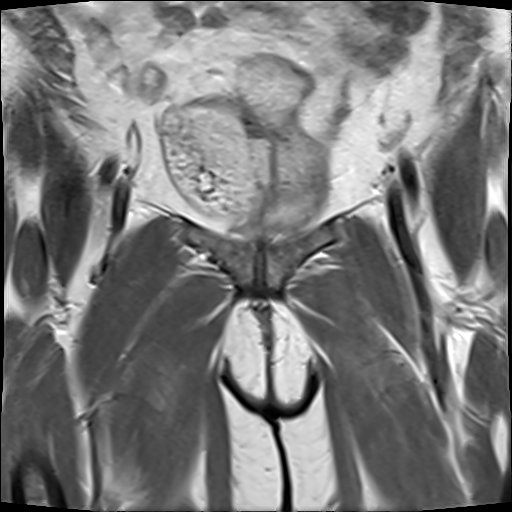

[Series 5: T1 · axial · 5.0mm · 0.43mm/px · z∈[-252,-33]mm · 4 of 40 slices shown]
[im 1/40]
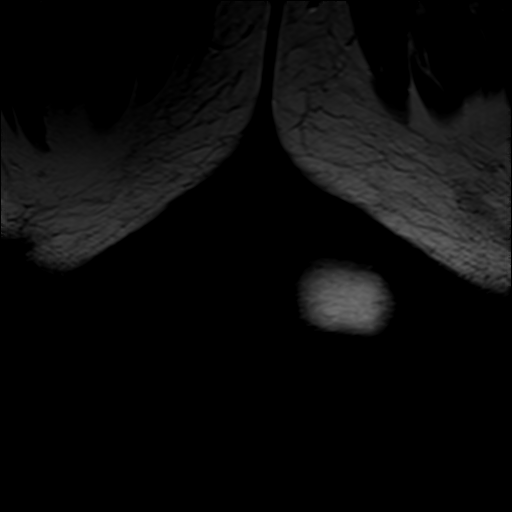
[im 14/40]
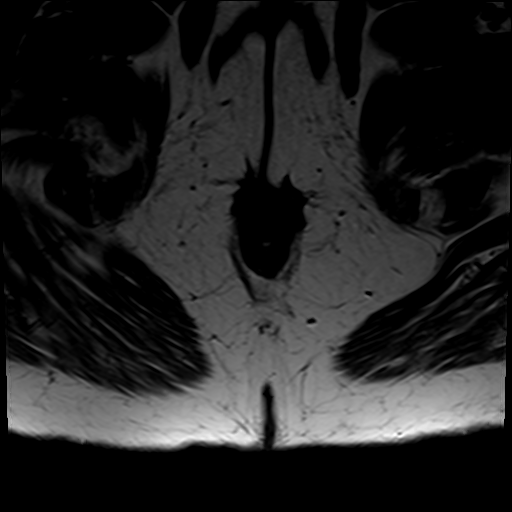
[im 27/40]
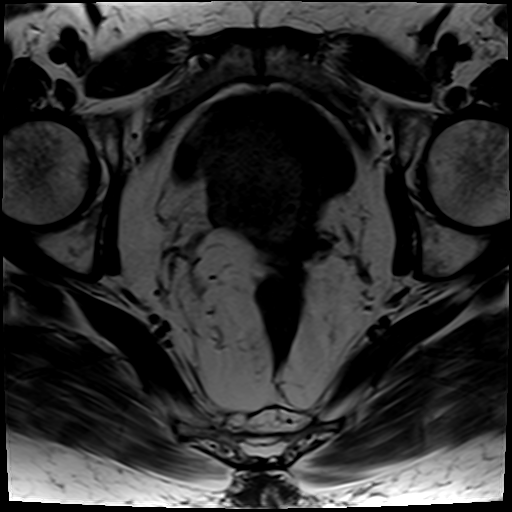
[im 40/40]
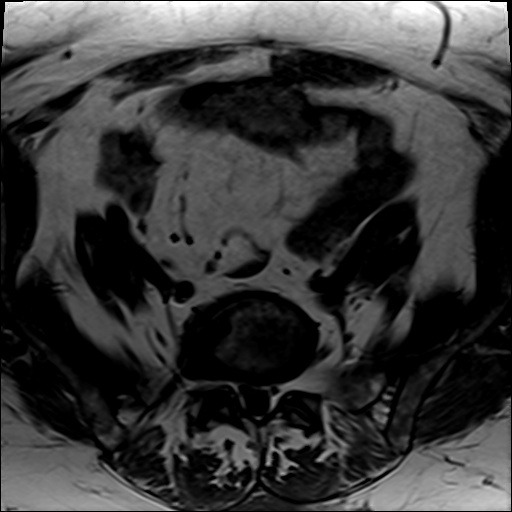

[Series 6: T2 · axial · 5.0mm · 0.43mm/px · z∈[-254,-37]mm · 4 of 40 slices shown (3 of 3)]
[im 1/40]
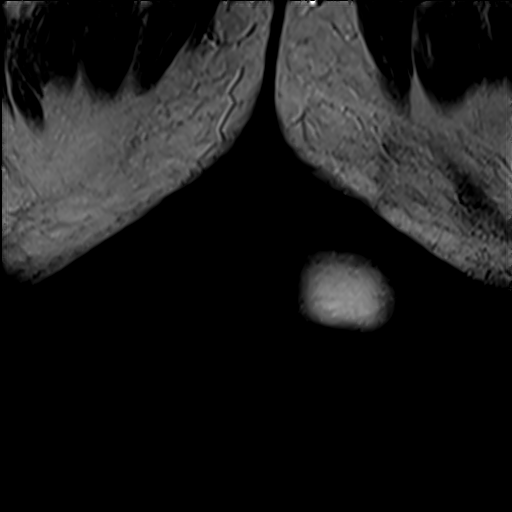
[im 14/40]
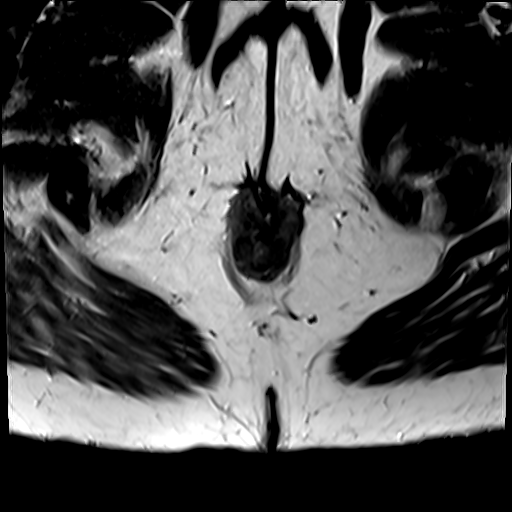
[im 27/40]
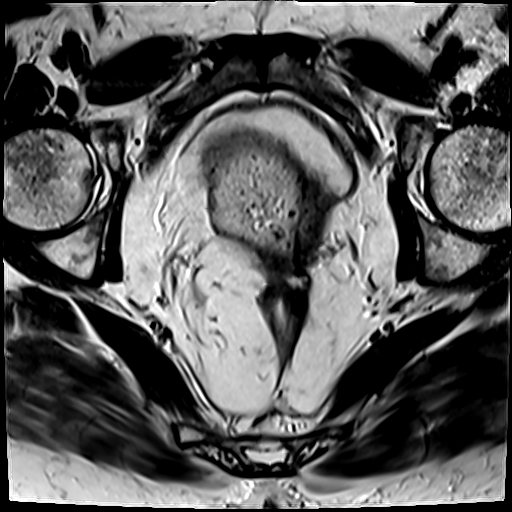
[im 40/40]
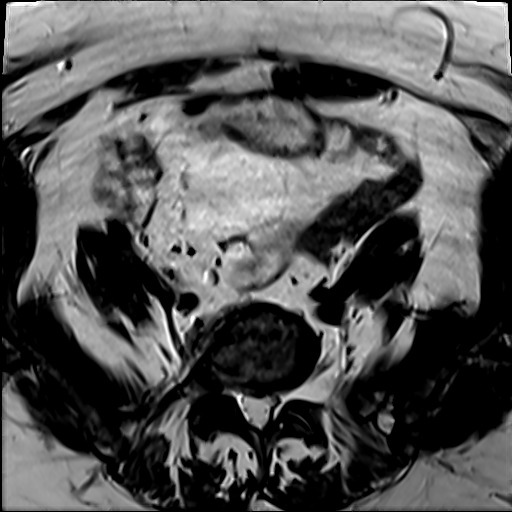

[Series 7: T2 fat-sat · axial · 5.0mm · 0.43mm/px · z∈[-254,-37]mm · 4 of 40 slices shown (2 of 2)]
[im 1/40]
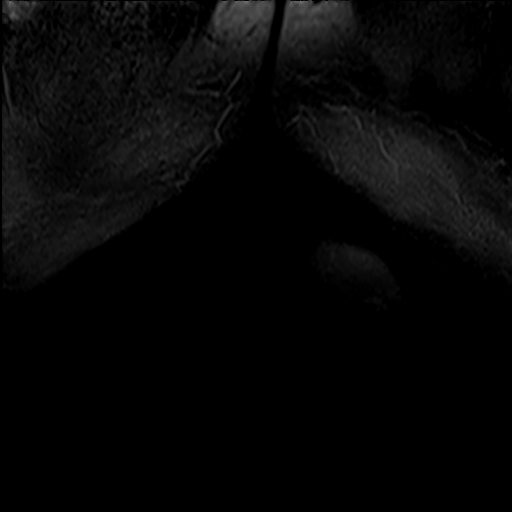
[im 14/40]
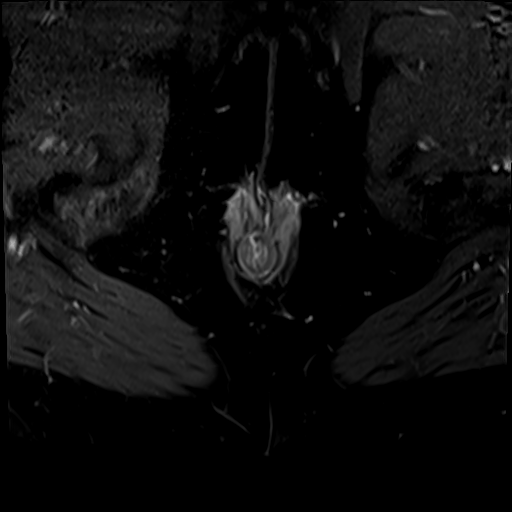
[im 27/40]
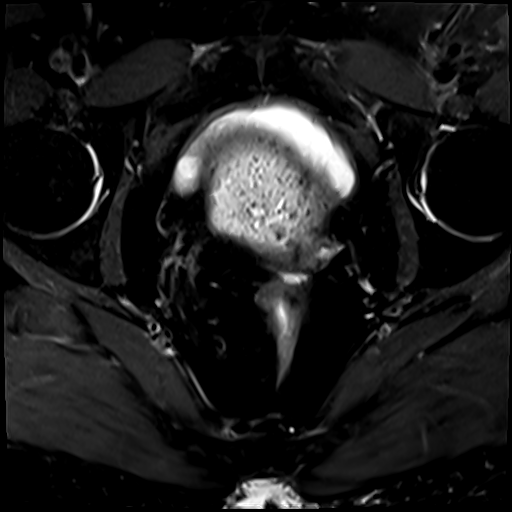
[im 40/40]
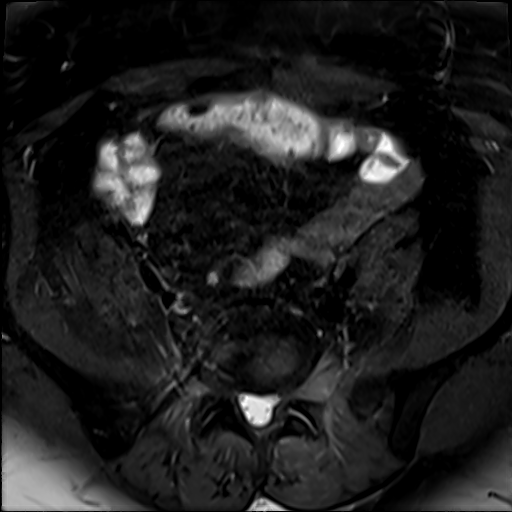

[28 of 48 positions shown; findings below may reference images not displayed]

FINDINGS: Lower Urinary Tract: Unremarkable urinary bladder.

Bowel: Expected postop changes from colectomy with J-pouch. A
complex fistula is seen arising from the region of the J-pouch
anastomosis along the posterior wall of the 6 o'clock position. This
extends posteriorly along both right and left sides of the gluteal
crease and the inferior margin of the coccyx and sacrum. This
measures approximately 6 cm in length. Less well-defined
inflammatory changes are seen extending superiorly throughout the
subcutaneous fat along the posterior aspect of the gluteal and
paraspinal muscles. No other secondary tracts or associated abscess
identified.

Vascular/Lymphatic: No pathologically enlarged lymph nodes or other
significant abnormality seen in lower pelvis.

Reproductive: Prior hysterectomy. Unremarkable appearance of vaginal
cuff. Normal appearance of right ovary.

Other: None.

Musculoskeletal: No significant abnormality identified.
IMPRESSION: Complex fistula arising from the J-pouch anastomosis at the 6
o'clock position, and extending posteriorly along both the right and
left sides of the gluteal crease.

Cellulitis extends superiorly throughout the posterior subcutaneous
fat, adjacent to the gluteal and posterior paraspinal muscles.

No evidence of abscess.

## 2020-02-14 MED ORDER — GADOBUTROL 1 MMOL/ML IV SOLN
10.0000 mL | Freq: Once | INTRAVENOUS | Status: AC | PRN
  Administered 2020-02-14: 9 mL via INTRAVENOUS

## 2020-02-19 ENCOUNTER — Other Ambulatory Visit: Payer: Self-pay

## 2020-02-19 MED ORDER — METRONIDAZOLE 250 MG PO TABS
250.0000 mg | ORAL_TABLET | Freq: Three times a day (TID) | ORAL | 0 refills | Status: DC
Start: 2020-02-19 — End: 2020-05-05

## 2020-02-19 MED ORDER — CIPROFLOXACIN HCL 500 MG PO TABS: 500.0000 mg | ORAL_TABLET | Freq: Two times a day (BID) | ORAL | 0 refills | Status: DC

## 2020-02-19 MED ORDER — CIPROFLOXACIN HCL 500 MG PO TABS
500.0000 mg | ORAL_TABLET | Freq: Every day | ORAL | 0 refills | Status: DC
Start: 2020-02-19 — End: 2020-05-27

## 2020-02-19 NOTE — Addendum Note (Signed)
Addended by: Marlon Pel on: 02/19/2020 03:36 PM   Modules accepted: Orders

## 2020-02-26 ENCOUNTER — Other Ambulatory Visit: Payer: Self-pay

## 2020-02-26 ENCOUNTER — Ambulatory Visit (INDEPENDENT_AMBULATORY_CARE_PROVIDER_SITE_OTHER): Payer: Medicare Other | Admitting: Family Medicine

## 2020-02-26 ENCOUNTER — Encounter: Payer: Self-pay | Admitting: Family Medicine

## 2020-02-26 VITALS — BP 140/90 | HR 87 | Temp 97.8°F | Resp 18 | Ht 63.0 in | Wt 205.4 lb

## 2020-02-26 DIAGNOSIS — N184 Chronic kidney disease, stage 4 (severe): Secondary | ICD-10-CM

## 2020-02-26 DIAGNOSIS — E2839 Other primary ovarian failure: Secondary | ICD-10-CM

## 2020-02-26 DIAGNOSIS — I10 Essential (primary) hypertension: Secondary | ICD-10-CM | POA: Diagnosis not present

## 2020-02-26 DIAGNOSIS — E213 Hyperparathyroidism, unspecified: Secondary | ICD-10-CM

## 2020-02-26 DIAGNOSIS — K5100 Ulcerative (chronic) pancolitis without complications: Secondary | ICD-10-CM

## 2020-02-26 DIAGNOSIS — E1169 Type 2 diabetes mellitus with other specified complication: Secondary | ICD-10-CM | POA: Diagnosis not present

## 2020-02-26 DIAGNOSIS — F418 Other specified anxiety disorders: Secondary | ICD-10-CM

## 2020-02-26 DIAGNOSIS — E785 Hyperlipidemia, unspecified: Secondary | ICD-10-CM

## 2020-02-26 DIAGNOSIS — R635 Abnormal weight gain: Secondary | ICD-10-CM | POA: Diagnosis not present

## 2020-02-26 DIAGNOSIS — E559 Vitamin D deficiency, unspecified: Secondary | ICD-10-CM

## 2020-02-26 DIAGNOSIS — Z833 Family history of diabetes mellitus: Secondary | ICD-10-CM

## 2020-02-26 DIAGNOSIS — D849 Immunodeficiency, unspecified: Secondary | ICD-10-CM

## 2020-02-26 DIAGNOSIS — D6852 Prothrombin gene mutation: Secondary | ICD-10-CM

## 2020-02-26 DIAGNOSIS — R739 Hyperglycemia, unspecified: Secondary | ICD-10-CM | POA: Diagnosis not present

## 2020-02-26 DIAGNOSIS — K91850 Pouchitis: Secondary | ICD-10-CM | POA: Diagnosis not present

## 2020-02-26 LAB — COMPREHENSIVE METABOLIC PANEL
ALT: 15 U/L (ref 0–35)
AST: 18 U/L (ref 0–37)
Albumin: 4 g/dL (ref 3.5–5.2)
Alkaline Phosphatase: 77 U/L (ref 39–117)
BUN: 38 mg/dL — ABNORMAL HIGH (ref 6–23)
CO2: 25 mEq/L (ref 19–32)
Calcium: 9.1 mg/dL (ref 8.4–10.5)
Chloride: 103 mEq/L (ref 96–112)
Creatinine, Ser: 2.1 mg/dL — ABNORMAL HIGH (ref 0.40–1.20)
GFR: 23.55 mL/min — ABNORMAL LOW (ref >60.00)
Glucose, Bld: 93 mg/dL (ref 70–99)
Potassium: 4.2 mEq/L (ref 3.5–5.1)
Sodium: 137 mEq/L (ref 135–145)
Total Bilirubin: 0.3 mg/dL (ref 0.2–1.2)
Total Protein: 6.8 g/dL (ref 6.0–8.3)

## 2020-02-26 LAB — CBC WITH DIFFERENTIAL/PLATELET
Basophils Absolute: 0.1 10*3/uL (ref 0.0–0.1)
Basophils Relative: 0.7 % (ref 0.0–3.0)
Eosinophils Absolute: 0.4 10*3/uL (ref 0.0–0.7)
Eosinophils Relative: 4.3 % (ref 0.0–5.0)
HCT: 33 % — ABNORMAL LOW (ref 36.0–46.0)
Hemoglobin: 10.7 g/dL — ABNORMAL LOW (ref 12.0–15.0)
Lymphocytes Relative: 21 % (ref 12.0–46.0)
Lymphs Abs: 1.9 10*3/uL (ref 0.7–4.0)
MCHC: 32.6 g/dL (ref 30.0–36.0)
MCV: 103.4 fl — ABNORMAL HIGH (ref 78.0–100.0)
Monocytes Absolute: 0.8 10*3/uL (ref 0.1–1.0)
Monocytes Relative: 9 % (ref 3.0–12.0)
Neutro Abs: 5.8 10*3/uL (ref 1.4–7.7)
Neutrophils Relative %: 65 % (ref 43.0–77.0)
Platelets: 294 10*3/uL (ref 150.0–400.0)
RBC: 3.19 Mil/uL — ABNORMAL LOW (ref 3.87–5.11)
RDW: 16.1 % — ABNORMAL HIGH (ref 11.5–15.5)
WBC: 8.9 10*3/uL (ref 4.0–10.5)

## 2020-02-26 LAB — LIPID PANEL
Cholesterol: 190 mg/dL (ref 0–200)
HDL: 43.5 mg/dL (ref >39.00)
LDL Cholesterol: 116 mg/dL — ABNORMAL HIGH (ref 0–99)
NonHDL: 146.23
Total CHOL/HDL Ratio: 4
Triglycerides: 152 mg/dL — ABNORMAL HIGH (ref 0.0–149.0)
VLDL: 30.4 mg/dL (ref 0.0–40.0)

## 2020-02-26 LAB — TSH: TSH: 4.18 u[IU]/mL (ref 0.35–4.50)

## 2020-02-26 LAB — HEMOGLOBIN A1C: Hgb A1c MFr Bld: 6 % (ref 4.6–6.5)

## 2020-02-26 LAB — VITAMIN D 25 HYDROXY (VIT D DEFICIENCY, FRACTURES): VITD: 51.38 ng/mL (ref 30.00–100.00)

## 2020-02-26 MED ORDER — BUPROPION HCL ER (XL) 150 MG PO TB24: 150.0000 mg | ORAL_TABLET | Freq: Every day | ORAL | 2 refills | Status: DC

## 2020-02-26 NOTE — Assessment & Plan Note (Signed)
Encouraged heart healthy diet, increase exercise, avoid trans fats, consider a krill oil cap daily 

## 2020-02-26 NOTE — Assessment & Plan Note (Signed)
-   Refer to healthy weight and wellness 

## 2020-02-26 NOTE — Assessment & Plan Note (Signed)
With fam hx dm--- check labs

## 2020-02-26 NOTE — Assessment & Plan Note (Signed)
Per hematology

## 2020-02-26 NOTE — Assessment & Plan Note (Signed)
Per GI

## 2020-02-26 NOTE — Patient Instructions (Signed)

## 2020-02-26 NOTE — Assessment & Plan Note (Signed)
Check labs today Will forward to nephrology

## 2020-02-26 NOTE — Progress Notes (Signed)
Patient ID: [name], female    DOB: [birth year]  Age: 64 y.o. MRN: [id number]    Subjective:  Subjective  HPI [name] presents for f/u and c/o weight gain --- she would like help losing weight   No other complaints  No refills needed today  Review of Systems  Constitutional: Positive for appetite change and unexpected weight change. Negative for diaphoresis and fatigue.  Eyes: Negative for pain, redness and visual disturbance.  Respiratory: Negative for cough, chest tightness, shortness of breath and wheezing.   Cardiovascular: Negative for chest pain, palpitations and leg swelling.  Endocrine: Negative for cold intolerance, heat intolerance, polydipsia, polyphagia and polyuria.  Genitourinary: Negative for difficulty urinating, dysuria and frequency.  Neurological: Negative for dizziness, light-headedness, numbness and headaches.    History Past Medical History:  Diagnosis Date  . Abdominal pain    Hx  . Allergy   . Anal stenosis   . Anemia   . Anxiety   . Arthritis   . Asthma    patient does not have inhaler  . Blood in stool    Hx  . Blood in urine    Hx  . Blood transfusion without reported diagnosis   . Cataract   . CKD (chronic kidney disease) stage 3, GFR 30-59 ml/min   . Crohn's colitis, other complication ([place])   . De Quervain's tenosynovitis   . Depression   . Difficulty urinating    Hx  . Easy bruising   . Esophagitis   . Fainting    History - resolved - due to dehydration  . Fatigue    Hx  . Fibroid    Hx  . Gastric polyp   . GERD (gastroesophageal reflux disease)   . 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 Hemorrhoids, external   . Hemorrhoids, internal   . Herpes, genital    vaginal treated 07/05/12 and pt states is resolved  . History of cervical dysplasia   . History of small bowel obstruction   . Hyperlipidemia    currently no meds  . Hyperparathyroidism   . Hypertension   . Hypokalemia    Hx  . 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 Obesity   . Ovarian cyst   . Poor dental hygiene   . Pulmonary nodule, right    right upper lobe  . Rectal bleeding    Hx  . Rectal pain    Hx  . Renal insufficiency    CKD - stage 3  . RLS (restless legs syndrome)    no meds  . Tooth infection 11/2016   right low  . Ulcerative colitis   . Visual disturbance    wears glasses  . Weakness generalized    Hx - patient denies generalized weakness  . Wears dentures    upper only    She has a past surgical history that includes Anal dilation; Restorative proctocolectomy; Ileostomy closure; Cholecystectomy; Tubal ligation; Hemorrhoid surgery; fatty tumor removed from back; Cervical biopsy w/ loop electrode excision; Upper gastrointestinal endoscopy; Colonoscopy; Sigmoidoscopy; Total abdominal hysterectomy (1998); and Shoulder arthroscopy with capsulorrhaphy (Left, 06/14/2019).   Her family history includes Cancer in her maternal uncle and sister; Diabetes in her sister; Heart attack in her father; Heart disease in her mother; Hypertension in her father and mother; Irritable bowel syndrome in  an other family member; Ulcerative colitis in her daughter and father.She reports that she quit smoking about 40 years ago. Her smoking use included cigarettes. She has a 4.00 pack-year smoking history. She has never used smokeless tobacco. She reports that she does not drink alcohol and does not use drugs.  Current Outpatient Medications on File Prior to Visit  Medication Sig Dispense Refill  . allopurinol (ZYLOPRIM) 300 MG tablet TAKE 1 TABLET BY MOUTH EVERY DAY 30 tablet 1  . AMBULATORY NON FORMULARY MEDICATION Medication Name: Nitroglycerine ointment 0.125 %  Apply a pea sized amount internally four times daily. Dispense 30 GM zero refill 30 g 3  . Biotin 5000 MCG TABS Take 5,000 mg by mouth daily.     . calcium carbonate  (TUMS - DOSED IN MG ELEMENTAL CALCIUM) 500 MG chewable tablet Chew 1 tablet by mouth daily as needed for indigestion or heartburn.    . ciprofloxacin (CIPRO) 500 MG tablet Take 1 tablet (500 mg total) by mouth daily. 14 tablet 0  . DULoxetine (CYMBALTA) 60 MG capsule Take 1 capsule (60 mg total) by mouth daily. 30 capsule 3  . Ergocalciferol (VITAMIN D2) 2000 units TABS Take 2,000 Units by mouth every 30 (thirty) days.     . famotidine (PEPCID) 20 MG tablet TAKE 1 TABLET BY MOUTH TWICE A DAY 741 tablet 0  . folic acid (FOLVITE) 1 MG tablet Take 2 tablets (2 mg total) by mouth daily. 180 tablet 3  . gabapentin (NEURONTIN) 100 MG capsule Take 1 capsule (100 mg total) by mouth 3 (three) times daily. 90 capsule 3  . hydrALAZINE (APRESOLINE) 50 MG tablet Take 50 mg by mouth 3 (three) times daily.     Marland Kitchen loperamide (LOPERAMIDE A-D) 2 MG tablet Take 0.5 tablets (1 mg total) by mouth daily. (Patient taking differently: Take 1 mg by mouth at bedtime. ) 180 tablet 3  . metoprolol succinate (TOPROL-XL) 100 MG 24 hr tablet Take 50 mg by mouth 2 (two) times daily.     . metroNIDAZOLE (FLAGYL) 250 MG tablet Take 1 tablet (250 mg total) by mouth 3 (three) times daily. 90 tablet 0  . Probiotic Product (PROBIOTIC PO) Take by mouth.    . simethicone (GAS-X) 80 MG chewable tablet Chew 80 mg by mouth daily as needed [employer]).     . vedolizumab (ENTYVIO) 300 MG injection Inject 300 mg into the vein every 30 (thirty) days.    . Zinc 50 MG TABS Take by mouth.    Marland Kitchen FLUoxetine (PROZAC) 20 MG capsule TAKE 1 CAPSULE BY MOUTH EVERY DAY (Patient not taking: Reported on 02/26/2020) 90 capsule 2   No current facility-administered medications on file prior to visit.     Objective:  Objective  Physical Exam Vitals and nursing note reviewed.  Constitutional:      Appearance: She is well-developed.  HENT:     Head: Normocephalic and atraumatic.  Eyes:     Conjunctiva/sclera: Conjunctivae normal.  Neck:     Thyroid: No  thyromegaly.     Vascular: No carotid bruit or JVD.  Cardiovascular:     Rate and Rhythm: Normal rate and regular rhythm.     Heart sounds: Normal heart sounds. No murmur heard.   Pulmonary:     Effort: Pulmonary effort is normal. No respiratory distress.     Breath sounds: Normal breath sounds. No wheezing or rales.  Chest:     Chest wall: No tenderness.  Musculoskeletal:     Cervical  back: Normal range of motion and neck supple.  Neurological:     Mental Status: She is alert and oriented to person, place, and time.    BP 140/90 (BP Location: Left Arm, Patient Position: Sitting, Cuff Size: Large)   Pulse 87   Temp 97.8 F (36.6 C) (Temporal)   Resp 18   Ht 5' 3"  (1.6 m)   Wt 205 lb 6.4 oz (93.2 kg)   SpO2 99%   BMI 36.38 kg/m  Wt Readings from Last 3 Encounters:  02/26/20 205 lb 6.4 oz (93.2 kg)  01/21/20 200 lb (90.7 kg)  01/15/20 200 lb (90.7 kg)     Lab Results  Component Value Date   WBC 11.1 (H) 01/04/2020   HGB 11.2 (L) 01/04/2020   HCT 35.1 (L) 01/04/2020   PLT 357 01/04/2020   GLUCOSE 136 (H) 12/17/2019   CHOL 208 (H) 12/17/2019   TRIG 223.0 (H) 12/17/2019   HDL 44.40 12/17/2019   LDLDIRECT 108.0 12/17/2019   LDLCALC 122 (H) 11/22/2014   ALT 13 12/17/2019   AST 19 12/17/2019   NA 138 12/17/2019   K 3.6 12/17/2019   CL 102 12/17/2019   CREATININE 2.12 (H) 12/17/2019   BUN 36 (H) 12/17/2019   CO2 26 12/17/2019   TSH 2.624 01/15/2017   INR 0.95 09/02/2017    MR PELVIS W WO CONTRAST  Result Date: 02/15/2020 CLINICAL DATA:  Perianal fistula. Pouch-itis. Ulcerative colitis. Previous total colectomy with J-pouch. EXAM: MRI PELVIS WITHOUT AND WITH CONTRAST TECHNIQUE: Multiplanar multisequence MR imaging of the pelvis was performed both before and after administration of intravenous contrast. CONTRAST:  52m GADAVIST GADOBUTROL 1 MMOL/ML IV SOLN COMPARISON:  None. FINDINGS: Lower Urinary Tract: Unremarkable urinary bladder. Bowel: Expected postop changes  from colectomy with J-pouch. A complex fistula is seen arising from the region of the J-pouch anastomosis along the posterior wall of the 6 o'clock position. This extends posteriorly along both right and left sides of the gluteal crease and the inferior margin of the coccyx and sacrum. This measures approximately 6 cm in length. Less well-defined inflammatory changes are seen extending superiorly throughout the subcutaneous fat along the posterior aspect of the gluteal and paraspinal muscles. No other secondary tracts or associated abscess identified. Vascular/Lymphatic: No pathologically enlarged lymph nodes or other significant abnormality seen in lower pelvis. Reproductive: Prior hysterectomy. Unremarkable appearance of vaginal cuff. Normal appearance of right ovary. Other: None. Musculoskeletal: No significant abnormality identified. 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 Cellulitis extends superiorly throughout the posterior subcutaneous fat, adjacent to the gluteal and posterior paraspinal muscles. No evidence of abscess. Electronically Signed   By: JMarlaine HindM.D.   On: 02/15/2020 08:44     Assessment & Plan:  Plan  I have discontinued Delany A. Rasheed's tinidazole. I am also having her start on buPROPion. Additionally, I am having her maintain her Biotin, folic acid, simethicone, Vitamin D2, AMBULATORY NON FORMULARY MEDICATION, metoprolol succinate, hydrALAZINE, loperamide, Entyvio, calcium carbonate, DULoxetine, gabapentin, famotidine, allopurinol, Probiotic Product (PROBIOTIC PO), Zinc, FLUoxetine, metroNIDAZOLE, and ciprofloxacin.  Meds ordered this encounter  Medications  . buPROPion (WELLBUTRIN XL) 150 MG 24 hr tablet    Sig: Take 1 tablet (150 mg total) by mouth daily.    Dispense:  30 tablet    Refill:  2    Problem List Items Addressed This Visit  Unprioritized   Benign essential HTN    Poorly controlled will alter medications, encouraged DASH diet, minimize caffeine and obtain adequate sleep. Report concerning symptoms and follow up as directed and as needed--- pt did not take med today       CKD (chronic kidney disease), stage IV (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[place])   Morbid obesity ([place])    Refer to healthy weight and wellness       Relevant Orders   Amb Ref to Medical Weight Management   TSH   CBC with Differential/Platelet   Hemoglobin A1c   Insulin, random   Vitamin D (25 hydroxy)   Prothrombin gene mutation ([place])    Per hematology      Ulcerative colitis without complications ([place])    Per GI       Other Visit Diagnoses    Depression with anxiety    -  Primary   Relevant Medications   buPROPion (WELLBUTRIN XL) 150 MG 24 hr tablet   Other Relevant Orders   Hemoglobin A1c   Insulin, random   Vitamin D (25 hydroxy)   Essential hypertension       Relevant Orders   Lipid panel   Comprehensive metabolic panel   Chronic kidney disease, stage 4 (severe) ([facility])       Relevant Orders   Comprehensive metabolic panel   Hyperlipidemia associated with type 2 diabetes mellitus ([place])       Relevant Orders   Lipid panel   Comprehensive metabolic panel   Weight gain       Relevant Orders   TSH   CBC with Differential/Platelet   Hemoglobin A1c   Insulin, random   Vitamin D (25 hydroxy)   Family history of diabetes mellitus in sister       Relevant Orders   Comprehensive metabolic panel   Hemoglobin A1c   Estrogen deficiency       Relevant Orders    Comprehensive metabolic panel   Vitamin D (25 hydroxy)   Vitamin D deficiency       Relevant Orders   Vitamin D (25 hydroxy)   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  Ann Held, DO

## 2020-02-26 NOTE — Assessment & Plan Note (Signed)
Poorly controlled will alter medications, encouraged DASH diet, minimize caffeine and obtain adequate sleep. Report concerning symptoms and follow up as directed and as needed--- pt did not take med today

## 2020-02-27 LAB — INSULIN, RANDOM: Insulin: 26 u[IU]/mL — ABNORMAL HIGH

## 2020-03-01 ENCOUNTER — Other Ambulatory Visit: Payer: Self-pay | Admitting: Family Medicine

## 2020-03-01 DIAGNOSIS — E8881 Metabolic syndrome: Secondary | ICD-10-CM

## 2020-03-01 MED ORDER — METFORMIN HCL ER 500 MG PO TB24: 500.0000 mg | ORAL_TABLET | Freq: Every day | ORAL | 1 refills | Status: DC

## 2020-03-03 ENCOUNTER — Encounter (INDEPENDENT_AMBULATORY_CARE_PROVIDER_SITE_OTHER): Payer: Self-pay

## 2020-03-05 ENCOUNTER — Other Ambulatory Visit: Payer: Self-pay

## 2020-03-09 ENCOUNTER — Other Ambulatory Visit: Payer: Self-pay | Admitting: Family Medicine

## 2020-03-12 DIAGNOSIS — K91858 Other complications of intestinal pouch: Secondary | ICD-10-CM | POA: Diagnosis not present

## 2020-03-17 DIAGNOSIS — N184 Chronic kidney disease, stage 4 (severe): Secondary | ICD-10-CM | POA: Diagnosis not present

## 2020-03-17 DIAGNOSIS — N179 Acute kidney failure, unspecified: Secondary | ICD-10-CM | POA: Diagnosis not present

## 2020-03-17 DIAGNOSIS — N2581 Secondary hyperparathyroidism of renal origin: Secondary | ICD-10-CM | POA: Diagnosis not present

## 2020-03-17 DIAGNOSIS — D631 Anemia in chronic kidney disease: Secondary | ICD-10-CM | POA: Diagnosis not present

## 2020-03-17 DIAGNOSIS — D509 Iron deficiency anemia, unspecified: Secondary | ICD-10-CM | POA: Diagnosis not present

## 2020-03-17 DIAGNOSIS — M109 Gout, unspecified: Secondary | ICD-10-CM | POA: Diagnosis not present

## 2020-03-17 DIAGNOSIS — Z6831 Body mass index (BMI) 31.0-31.9, adult: Secondary | ICD-10-CM | POA: Diagnosis not present

## 2020-03-17 DIAGNOSIS — R768 Other specified abnormal immunological findings in serum: Secondary | ICD-10-CM | POA: Diagnosis not present

## 2020-03-17 DIAGNOSIS — I129 Hypertensive chronic kidney disease with stage 1 through stage 4 chronic kidney disease, or unspecified chronic kidney disease: Secondary | ICD-10-CM | POA: Diagnosis not present

## 2020-03-17 DIAGNOSIS — E876 Hypokalemia: Secondary | ICD-10-CM | POA: Diagnosis not present

## 2020-03-19 ENCOUNTER — Other Ambulatory Visit: Payer: Self-pay | Admitting: Family Medicine

## 2020-03-19 DIAGNOSIS — F418 Other specified anxiety disorders: Secondary | ICD-10-CM

## 2020-03-20 ENCOUNTER — Telehealth: Payer: Self-pay | Admitting: Gastroenterology

## 2020-03-20 NOTE — Telephone Encounter (Signed)
Called back, no answer. Not able to leave voice mail, box is full. I had recommended to her that she follow up with Dr. Emelda Fear at [facility] (IBD clinic) given the complexity of her case, and if [facility] surgery thinks she would be better served with a consult at [facility] colorectal surgery then I agree with that. Can you relay this to her, and if she has any further questions please let me know. Thanks

## 2020-03-20 NOTE — Telephone Encounter (Signed)
Dr. Havery Moros, can you please call her at your convenience?

## 2020-03-21 NOTE — Telephone Encounter (Signed)
Patient notified.  She has not been seen with Dr. Emelda Fear since 2017.  Referral faxed.  She wants to have some clarifying questions with Dr. Emelda Fear setteled before proceeding with surgery referral.

## 2020-04-01 ENCOUNTER — Other Ambulatory Visit: Payer: Self-pay | Admitting: Family Medicine

## 2020-04-01 DIAGNOSIS — F322 Major depressive disorder, single episode, severe without psychotic features: Secondary | ICD-10-CM

## 2020-04-01 NOTE — Telephone Encounter (Signed)
Patient has been scheduled to see Dr. Sheryn Bison at [facility] on 04/10/20

## 2020-04-08 ENCOUNTER — Other Ambulatory Visit: Payer: Self-pay | Admitting: Gastroenterology

## 2020-04-10 DIAGNOSIS — K91850 Pouchitis: Secondary | ICD-10-CM | POA: Diagnosis not present

## 2020-04-10 DIAGNOSIS — K603 Anal fistula: Secondary | ICD-10-CM | POA: Diagnosis not present

## 2020-04-28 DIAGNOSIS — Z8719 Personal history of other diseases of the digestive system: Secondary | ICD-10-CM | POA: Diagnosis not present

## 2020-04-28 DIAGNOSIS — D509 Iron deficiency anemia, unspecified: Secondary | ICD-10-CM | POA: Diagnosis not present

## 2020-04-28 DIAGNOSIS — K603 Anal fistula: Secondary | ICD-10-CM | POA: Diagnosis not present

## 2020-04-28 DIAGNOSIS — K624 Stenosis of anus and rectum: Secondary | ICD-10-CM | POA: Diagnosis not present

## 2020-04-28 DIAGNOSIS — K91850 Pouchitis: Secondary | ICD-10-CM | POA: Diagnosis not present

## 2020-04-28 DIAGNOSIS — D631 Anemia in chronic kidney disease: Secondary | ICD-10-CM | POA: Diagnosis not present

## 2020-04-28 DIAGNOSIS — Z79899 Other long term (current) drug therapy: Secondary | ICD-10-CM | POA: Diagnosis not present

## 2020-04-28 DIAGNOSIS — Z9049 Acquired absence of other specified parts of digestive tract: Secondary | ICD-10-CM | POA: Diagnosis not present

## 2020-04-28 DIAGNOSIS — Z885 Allergy status to narcotic agent status: Secondary | ICD-10-CM | POA: Diagnosis not present

## 2020-04-28 DIAGNOSIS — J45909 Unspecified asthma, uncomplicated: Secondary | ICD-10-CM | POA: Diagnosis not present

## 2020-04-28 DIAGNOSIS — K219 Gastro-esophageal reflux disease without esophagitis: Secondary | ICD-10-CM | POA: Diagnosis not present

## 2020-04-28 DIAGNOSIS — Z87891 Personal history of nicotine dependence: Secondary | ICD-10-CM | POA: Diagnosis not present

## 2020-04-28 DIAGNOSIS — I129 Hypertensive chronic kidney disease with stage 1 through stage 4 chronic kidney disease, or unspecified chronic kidney disease: Secondary | ICD-10-CM | POA: Diagnosis not present

## 2020-04-28 DIAGNOSIS — N182 Chronic kidney disease, stage 2 (mild): Secondary | ICD-10-CM | POA: Diagnosis not present

## 2020-04-28 DIAGNOSIS — K589 Irritable bowel syndrome without diarrhea: Secondary | ICD-10-CM | POA: Diagnosis not present

## 2020-04-28 DIAGNOSIS — Z882 Allergy status to sulfonamides status: Secondary | ICD-10-CM | POA: Diagnosis not present

## 2020-05-01 ENCOUNTER — Other Ambulatory Visit: Payer: Self-pay | Admitting: Family Medicine

## 2020-05-01 DIAGNOSIS — M79671 Pain in right foot: Secondary | ICD-10-CM

## 2020-05-01 DIAGNOSIS — M79672 Pain in left foot: Secondary | ICD-10-CM

## 2020-05-02 ENCOUNTER — Telehealth: Payer: Self-pay | Admitting: Gastroenterology

## 2020-05-02 NOTE — Telephone Encounter (Signed)
Yes I saw her consult note. I think they had planned an EUA, but I have not seen that report or her final recommendations yet.

## 2020-05-02 NOTE — Telephone Encounter (Signed)
Have you seen results from Dr. Sheryn Bison?

## 2020-05-05 ENCOUNTER — Inpatient Hospital Stay: Payer: Medicare Other | Attending: Family | Admitting: Family

## 2020-05-05 ENCOUNTER — Inpatient Hospital Stay: Payer: Medicare Other

## 2020-05-05 MED ORDER — METRONIDAZOLE 250 MG PO TABS: 250.0000 mg | ORAL_TABLET | Freq: Three times a day (TID) | ORAL | 0 refills | Status: AC

## 2020-05-05 NOTE — Telephone Encounter (Signed)
Patient notified.  New rx sent She states that Dr. Sheryn Bison told her she would send the results for Dr. Havery Moros and to follow up with him.

## 2020-05-05 NOTE — Telephone Encounter (Signed)
Patient requesting a round a flagyl.  Patient had a pouchoscopy. They did a dilation of two area.  Patient reports that she is having incontinence and passing large amounts of mucus and stools.  She reports that she is unaware that she is having these incontinence issues.  She reports that Dr. Sheryn Bison told her that was a large amount of inflammation.  Patient also having lower abdominal pressure and aches, across the entire lower abdomen.

## 2020-05-05 NOTE — Telephone Encounter (Signed)
We can give her 241m TID for 2 weeks of flagyl, she has a history of pouchitis and has responded to that in the past, also ? history of Crohn's of pouch. Does she had follow up with Dr. TSheryn Bisonor have plans for next step in this? Thanks

## 2020-05-05 NOTE — Telephone Encounter (Signed)
Patient notified

## 2020-05-05 NOTE — Addendum Note (Signed)
Addended by: Marlon Pel on: 05/05/2020 12:59 PM   Modules accepted: Orders

## 2020-05-05 NOTE — Telephone Encounter (Signed)
[name] can you help book her a follow up with me? thanks

## 2020-05-07 NOTE — Telephone Encounter (Signed)
I left a message for the patient to call and schedule an office visit at her next convenience.

## 2020-05-22 ENCOUNTER — Telehealth: Payer: Self-pay | Admitting: Family Medicine

## 2020-05-22 DIAGNOSIS — F322 Major depressive disorder, single episode, severe without psychotic features: Secondary | ICD-10-CM

## 2020-05-22 NOTE — Telephone Encounter (Signed)
Caller : [facility]  Call Back # [phone]  DULoxetine (CYMBALTA) 60 MG capsule [[id number]   Patient states that she would like to go back to her old prescription doseage.   DULoxetine (CYMBALTA) 120MG

## 2020-05-22 NOTE — Telephone Encounter (Signed)
Patient states she has stop taking   FLUoxetine (PROZAC) 20 MG capsule

## 2020-05-23 ENCOUNTER — Telehealth: Payer: Self-pay | Admitting: Gastroenterology

## 2020-05-23 DIAGNOSIS — K5000 Crohn's disease of small intestine without complications: Secondary | ICD-10-CM

## 2020-05-23 MED ORDER — DULOXETINE HCL 60 MG PO CPEP: 60.0000 mg | ORAL_CAPSULE | Freq: Every day | ORAL | 1 refills | Status: DC

## 2020-05-23 MED ORDER — METRONIDAZOLE 250 MG PO TABS: 250.0000 mg | ORAL_TABLET | Freq: Three times a day (TID) | ORAL | 0 refills | Status: AC

## 2020-05-23 NOTE — Telephone Encounter (Signed)
Pt has stopped taking Prozac and would like to go back on Cymbalta. Please advise

## 2020-05-23 NOTE — Telephone Encounter (Signed)
Patient called states she is leaking and needs advise also stated the [name] is not working please advise

## 2020-05-23 NOTE — Telephone Encounter (Signed)
Spoke with patient she states that she has fecal incontinence, pt states that she is having to use more diapers and pads than usual, no abdominal pain. Pt states that she is taking generic for Imodium, taking it up to 3 times a day. Pt also states that she has been through 28 pads this week. She states that her last  Entyvio infusion was yesterday, pt states that she was told by Dr. Sheryn Bison at [facility] that [name] is not working as it should. Records are in care everywhere. Pt states that Dr. Sheryn Bison stated that she would discuss findings with you. Please advise, thank you

## 2020-05-23 NOTE — Telephone Encounter (Signed)
Difficult situation, I need to see her in clinic to discuss options. I have referred her to see Dr. Sheryn Bison and they are recommending excision of her pouch with permanent ileostomy. Sounds like Entyvio is not working for any more. Not sure if she has Crohn's of the pouch causing this, pouchitis, or pouch dysfunction. She has responded to flagyl and other antibiotics when given to her in the past, is she taking any at present time? If not let's give her flagyl 263m TID for 2 weeks and see how she does. Otherwise need to add her into clinic in the next 2-3 weeks for a visit with me if she is able to. Thanks

## 2020-05-23 NOTE — Telephone Encounter (Signed)
Med sent to the pharmacy and pt made aware

## 2020-05-23 NOTE — Telephone Encounter (Signed)
Spoke with patient regarding recommendations. Pt states that she is not currently on any antibiotics, pt aware that we will send in Flagyl 250 mg TID for 2 weeks. Pt is scheduled to see Dr. Havery Moros on 05/27/20 at 3:40 pm. RX sent to pharmacy.

## 2020-05-23 NOTE — Telephone Encounter (Signed)
Cymbalta #60  1 po qd x 1 week then increase 2 qd ----  call before she runs out for rx

## 2020-05-27 ENCOUNTER — Encounter: Payer: Self-pay | Admitting: Gastroenterology

## 2020-05-27 ENCOUNTER — Telehealth: Payer: Self-pay

## 2020-05-27 ENCOUNTER — Other Ambulatory Visit (INDEPENDENT_AMBULATORY_CARE_PROVIDER_SITE_OTHER): Payer: Medicare Other

## 2020-05-27 ENCOUNTER — Ambulatory Visit (INDEPENDENT_AMBULATORY_CARE_PROVIDER_SITE_OTHER): Payer: Medicare Other | Admitting: Gastroenterology

## 2020-05-27 VITALS — BP 136/68 | HR 74 | Ht 63.0 in | Wt 201.0 lb

## 2020-05-27 DIAGNOSIS — K91850 Pouchitis: Secondary | ICD-10-CM

## 2020-05-27 DIAGNOSIS — K603 Anal fistula: Secondary | ICD-10-CM

## 2020-05-27 DIAGNOSIS — K5000 Crohn's disease of small intestine without complications: Secondary | ICD-10-CM

## 2020-05-27 DIAGNOSIS — F322 Major depressive disorder, single episode, severe without psychotic features: Secondary | ICD-10-CM

## 2020-05-27 LAB — BUN: BUN: 30 mg/dL — ABNORMAL HIGH (ref 6–23)

## 2020-05-27 LAB — CREATININE, SERUM: Creatinine, Ser: 2.21 mg/dL — ABNORMAL HIGH (ref 0.40–1.20)

## 2020-05-27 MED ORDER — DULOXETINE HCL 60 MG PO CPEP: 60.0000 mg | ORAL_CAPSULE | Freq: Every day | ORAL | 1 refills | Status: DC

## 2020-05-27 NOTE — Telephone Encounter (Signed)
Spoke with patient. Med resent

## 2020-05-27 NOTE — Patient Instructions (Addendum)
If you are age 66 or older, your body mass index should be between 23-30. Your Body mass index is 35.61 kg/m. If this is out of the aforementioned range listed, please consider follow up with your Primary Care Provider.  If you are age 42 or younger, your body mass index should be between 19-25. Your Body mass index is 35.61 kg/m. If this is out of the aformentioned range listed, please consider follow up with your Primary Care Provider.   Please go to the lab in the basement of our building to have lab work done as you leave today. Hit "B" for basement when you get on the elevator.  When the doors open the lab is on your left.  We will call you with the results. Thank you.  Due to recent changes in healthcare laws, you may see the results of your imaging and laboratory studies on MyChart before your provider has had a chance to review them.  We understand that in some cases there may be results that are confusing or concerning to you. Not all laboratory results come back in the same time frame and the provider may be waiting for multiple results in order to interpret others.  Please give [place] 48 hours in order for your provider to thoroughly review all the results before contacting the office for clarification of your results.    You have been scheduled for an MR Enterography of abdomen and pelvis at [facility] on Wednesday, 9-29. Your appointment time is 4:00pm. Please arrive 2 hours prior (at 2:00pm)  for registration purposes and to drink contrast. Please make certain not to have anything to eat or drink 4 hours prior to your test. In addition, if you have any metal in your body, have a pacemaker or defibrillator, please be sure to let your ordering physician know. This test typically takes 45 minutes to 1 hour to complete. Should you need to reschedule, please call [phone] to do so.   Thank you for entrusting me with your care and for choosing [facility], Dr. Argyle Cellar

## 2020-05-27 NOTE — Progress Notes (Signed)
HPI :  IBD history: 64 year old white female with IBD ( Crohn's) since 82. 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 She was never able to be put in remission prior to colectomy. Shewasonly exposed to steroids prior to her surgery. Post-operatively, she was on Remicade for 3-4 years butdid not helptoo much. She hadbeen on 6MP remotely but did not work for her. She was on Humira but also did not put her into remission and she developed high antibody titers and it was stopped. Most recentlyon Entyvio and low dose 6MP. 6MP was stopped due to worsening anemia.She reports multiple courses of antibiotics with some benefit historically. She has been on budesonider with ? Relief. Father and daughter have UC. 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   SINCE THE LAST VISIT  64 year old female here for follow-up visit.  Since of last seen her she has been maintained on Entyvio every 4 weeks. She is on higher dose Entyvio given prior level of 4.8 without antibodies and pouchoscopy at the time showing mild inflammation.  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  The rest of the pouch appeared okay actually, biopsies did not show any significant inflammation.  MRI of the pelvis was obtained showing a complex fistulous tract.  She was referred to Dr. Marcello Moores of [facility] who subsequently referred her back to Dr. Sheryn Bison at [facility].  She has seen Dr. Sheryn Bison for surgical opinion.  Dr. Sheryn Bison has recommended excision of the pouch with end ileostomy.  The patient is asking for my opinion on her case in regards to possible surgery.  She has not been doing well lately.  She for the past several months has had increased loose stools, has had issues with incontinence at times, wearing diapers for  leakage.  She apparently had an EUA with Dr. Sheryn Bison last month, I do not have the results of that on file.  She had a dilation it sounds like of the stenosis.  The patient has had some worsening leakage since then and has been wearing diapers to sleep.  She has been given a course of Flagyl by me empirically for component of pouchitis, 250 mg 3 times daily, this is helped reduce some of her diarrhea and she is a bit better.  She continues to have Entyvio once monthly.  She has not had any abscess or drainage from the fistula that she is aware of.   Pouchoscopy 03/31/17 - anal fissure, mild anal canal stenosis, focal ulceration of pouch and more proximal ileum but improved compared to previous  Pouchoscopy 01/21/20 -  Preparation of the colon was poor. - A suspected small perianal fistulous tract and anal canal stenosis found on digital rectal exam. - The ileal pouch has an angulated turn with very focal ulcerations, otherwise no inflammatory changes noted. biopsies obtained - Small area of nodularity along anal canal - biopsied to rule out AIN / condyloma - Suspected fistal of the distal pouch just proximal to anal canal Overall, only one focal ulceration noted and no other inflammatory changes seen. Incidentally noted suspected new fistula   EGD 01/21/20 -  - A 3 cm hiatal hernia was present. - LA Grade A esophagitis was found 33 cm from the incisors - mild. - The  exam of the esophagus was otherwise normal. No Barrett's esophagus. - Multiple small sessile polyps were found in the gastric fundus and in the gastric body, benign appearing, likely fundic gland polyps, a cluster was noted at the distal end of the hernia sac. Biopsies were taken with a cold forceps for histology. - The exam of the stomach was otherwise normal. - The duodenal bulb and second portion of the duodenum were normal.  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  Cellulitis extends superiorly throughout the posterior subcutaneous fat, adjacent to the gluteal and posterior paraspinal muscles.  No evidence of abscess.   Past Medical History:  Diagnosis Date  . Abdominal pain    Hx  . Allergy   . Anal stenosis   . Anemia   . Anxiety   . Arthritis   . Asthma    patient does not have inhaler  . Blood in stool    Hx  . Blood in urine    Hx  . Blood transfusion without reported diagnosis   . Cataract   . CKD (chronic kidney disease) stage 3, GFR 30-59 ml/min   . Crohn's colitis, other complication ([place])   . De Quervain's tenosynovitis   . Depression   . Difficulty urinating    Hx  . Easy bruising   . Esophagitis   . Fainting    History - resolved - due to dehydration  . Fatigue    Hx  . Fibroid    Hx  . Gastric polyp   . GERD (gastroesophageal reflux disease)   . 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 Hemorrhoids, external   . Hemorrhoids, internal   . Herpes, genital    vaginal treated 07/05/12 and pt states is resolved  . History of cervical dysplasia   . History of small bowel obstruction   . Hyperlipidemia    currently no meds  . Hyperparathyroidism   . Hypertension   . Hypokalemia    Hx  . 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 Obesity   . Ovarian cyst   . Poor dental hygiene   . Pulmonary nodule, right    right upper lobe  . Rectal bleeding    Hx  . Rectal pain    Hx  . Renal insufficiency    CKD - stage 3  . RLS (restless legs syndrome)    no meds  . Tooth infection 11/2016   right low  . Ulcerative colitis   . Visual disturbance    wears glasses  . Weakness generalized    Hx - patient denies generalized weakness  . Wears dentures    upper only     Past Surgical History:   Procedure Laterality Date  . ANAL DILATION    . CERVICAL BIOPSY  W/ LOOP ELECTRODE EXCISION    . CHOLECYSTECTOMY    . COLONOSCOPY     [name]  . fatty tumor removed from back     X 2  . HEMORRHOID SURGERY    . ILEOSTOMY CLOSURE    . RESTORATIVE PROCTOCOLECTOMY     with insertion of ileoanal J Pouch with loop ileostomy  . SHOULDER ARTHROSCOPY WITH CAPSULORRHAPHY Left 06/14/2019   Procedure:  LEFT SHOULDER ARTHRSCOPIC REPAIR OF BONY BANKART FRACTURE;  Surgeon: Tania Ade, MD;  Location: [facility];  Service: Orthopedics;  Laterality: Left;  REQUEST 90 MINUTE  . SIGMOIDOSCOPY    . TOTAL ABDOMINAL HYSTERECTOMY  1998   TAH/LSO  . TUBAL LIGATION    . UPPER GASTROINTESTINAL ENDOSCOPY     [name]   Family History  Problem Relation Age of Onset  . Ulcerative colitis Father   . Hypertension Father   . Heart attack Father   . Hypertension Mother   . Heart disease Mother        s/p pci  . Ulcerative colitis Daughter   . Irritable bowel syndrome Other        grandchildren  . Diabetes Sister   . Cancer Sister        uterine  . Cancer Maternal Uncle        LUNG  . Colon cancer Neg Hx   . Esophageal cancer Neg Hx   . Stomach cancer Neg Hx   . Rectal cancer Neg Hx    Social History   Tobacco Use  . Smoking status: Former Smoker    Packs/day: 1.00    Years: 4.00    Pack years: 4.00    Types: Cigarettes    Quit date: 05/21/1979    Years since quitting: 41.0  . Smokeless tobacco: Never Used  Vaping Use  . Vaping Use: Never used  Substance Use Topics  . Alcohol use: No    Alcohol/week: 0.0 standard drinks  . Drug use: No   Current Outpatient Medications  Medication Sig Dispense Refill  . allopurinol (ZYLOPRIM) 300 MG tablet Take 1 tablet (300 mg total) by mouth daily. 90 tablet 1  . AMBULATORY NON FORMULARY MEDICATION Medication Name: Nitroglycerine ointment 0.125 %  Apply a pea sized amount internally four times daily. Dispense 30 GM zero refill 30 g 3  . Biotin 5000 MCG  TABS Take 5,000 mg by mouth daily.     Marland Kitchen buPROPion (WELLBUTRIN XL) 150 MG 24 hr tablet TAKE 1 TABLET BY MOUTH EVERY DAY 90 tablet 1  . calcium carbonate (TUMS - DOSED IN MG ELEMENTAL CALCIUM) 500 MG chewable tablet Chew 1 tablet by mouth daily as needed for indigestion or heartburn.    . DULoxetine (CYMBALTA) 60 MG capsule Take 1 capsule (60 mg total) by mouth daily. (Patient taking differently: Take 120 mg by mouth daily. ) 60 capsule 1  . Ergocalciferol (VITAMIN D2) 2000 units TABS Take 2,000 Units by mouth every 30 (thirty) days.     . famotidine (PEPCID) 20 MG tablet TAKE 1 TABLET BY MOUTH TWICE A DAY 967 tablet 0  . folic acid (FOLVITE) 1 MG tablet Take 2 tablets (2 mg total) by mouth daily. 180 tablet 3  . gabapentin (NEURONTIN) 100 MG capsule TAKE 1 CAPSULE BY MOUTH THREE TIMES A DAY (Patient taking differently: Take 100 mg by mouth 2 (two) times daily. ) 90 capsule 3  . hydrALAZINE (APRESOLINE) 50 MG tablet Take 50 mg by mouth 3 (three) times daily.     Marland Kitchen loperamide (LOPERAMIDE A-D) 2 MG tablet Take 0.5 tablets (1 mg total) by mouth daily. (Patient taking differently: Take 1 mg by mouth at bedtime. ) 180 tablet 3  . metFORMIN (GLUCOPHAGE XR) 500 MG 24 hr tablet Take 1 tablet (500 mg total) by mouth daily with breakfast. 90 tablet 1  . metoprolol succinate (TOPROL-XL) 100 MG 24 hr tablet Take 50 mg by mouth 2 (two)  times daily. Take 100 mg in the a.m. and 50 mg in the evening    . metroNIDAZOLE (FLAGYL) 250 MG tablet Take 1 tablet (250 mg total) by mouth 3 (three) times daily for 14 days. 42 tablet 0  . Probiotic Product (PROBIOTIC PO) Take by mouth.    . simethicone (GAS-X) 80 MG chewable tablet Chew 80 mg by mouth daily as needed [employer]).     . vedolizumab (ENTYVIO) 300 MG injection Inject 300 mg into the vein every 30 (thirty) days.    . Zinc 50 MG TABS Take by mouth.     No current facility-administered medications for this visit.   Allergies  Allergen Reactions  . Sulfa Antibiotics  Hives  . Morphine Other (See Comments)    Gives me crazy dreams Gives me crazy dreams  . Sulfonamide Derivatives Itching, Swelling and Rash     Review of Systems: All systems reviewed and negative except where noted in HPI.   Lab Results  Component Value Date   WBC 8.9 02/26/2020   HGB 10.7 (L) 02/26/2020   HCT 33.0 (L) 02/26/2020   MCV 103.4 (H) 02/26/2020   PLT 294.0 02/26/2020    Lab Results  Component Value Date   CREATININE 2.10 (H) 02/26/2020   BUN 38 (H) 02/26/2020   NA 137 02/26/2020   K 4.2 02/26/2020   CL 103 02/26/2020   CO2 25 02/26/2020    Lab Results  Component Value Date   ALT 15 02/26/2020   AST 18 02/26/2020   ALKPHOS 77 02/26/2020   BILITOT 0.3 02/26/2020     Physical Exam: BP 136/68   Pulse 74   Ht 5' 3"  (1.6 m)   Wt 201 lb (91.2 kg)   BMI 35.61 kg/m  Constitutional: Pleasant,well-developed, female in no acute distress. Abdominal: Soft, nondistended, scarring right mid abdomen from prior ostomy nontender.  There are no masses palpable.  Extremities: no edema Lymphadenopathy: No cervical adenopathy noted. Neurological: Alert and oriented to person place and time. Skin: Skin is warm and dry. No rashes noted. Psychiatric: Normal mood and affect. Behavior is normal.   ASSESSMENT AND PLAN: 64 year old female here for reassessment of the following:  Crohn's with complication / Pouchitis / Perianal fistula - Ms. Cargile continues to have a lot of problems with her pouch  including increased frequency and leakage.  She has developed a perianal fistula since I have last seen her in the office however she has not had any abscess or complications from that.  I have had her see surgery for their opinion.  Dr. Sheryn Bison of [facility] is recommending pouch excision with permanent end ileostomy, and given her ongoing chronic symptoms, I think this may be her best option.  She has failed numerous medical regimens, and Entyvio is clearly not keeping her in remission  from the symptoms.  She did have some areas of stenosis and ulceration on her last pouchoscopy.  She is wearing diapers to sleep and having leakage due to symptoms.  Recent Flagyl has helped slightly, perhaps component of pouchitis with this, however long-term I do think she may do better with surgical resection of the pouch.  We discussed this at length.  Dr. Sheryn Bison was concerned about her understanding of this disease process, I counseled her that C. difficile is not causing these chronic problems and that she more likely has Crohn's of the pouch with chronic pouch dysfunction.  I am going to proceed with an MR enterography to clarify any active inflammation  of the rest of her small bowel.  Per radiology okay to give her IV contrast with this in light of her kidney disease given newer types of contrast they now use for this, she previously tolerated IV contrast for her MRI pelvis.  Otherwise I want her to talk with her family about surgery for this, the patient feels like she is ready for this although her husband and sister have reservations.  I will otherwise talk with Dr. Sheryn Bison if I can get a hold of her to discuss EUA results which I have not been able to see and discussed this case with her.  Patient in agreement with plan, she will complete her course of Flagyl in the interim.  If she declined surgery at this time, we can consider switching to [facility] although I think this is unlikely to provide remission of her symptoms given failure of multiple regimens to date.  Prospect Cellar, MD [facility]

## 2020-05-27 NOTE — Telephone Encounter (Signed)
Pt called stating pharmacy told her they did not receive med refill notification on Cymbalta.  Notified pt it was escribed to them on Friday, 9/17 at 4:29 pm.  Please resend to pt's pharmacy.  She is completely out of medication.

## 2020-05-28 ENCOUNTER — Telehealth: Payer: Self-pay

## 2020-05-28 NOTE — Telephone Encounter (Signed)
Pt called stating she needs Cymbalta 123m, not 67m  She stated when she was referred to the podiatrist she was told that he would recommend her staying at the [facility] to help with the foot pain she has.  Pt is requesting, to finish out the month of September, PCP write an additional script of 18 qty at 33m20md she will pick up this script along with the previous script that was sent in this week and then start the month of October on 120mg77mt stated she was going to be here in the morning at 8am to get this straight and I advised her provider and medical assistant would be seeing pts and that would not be feasible; however, I would route a message to her provider as high priority.

## 2020-05-29 NOTE — Telephone Encounter (Signed)
Attempted to call patient. Phone kept ringing. No VM available

## 2020-06-04 ENCOUNTER — Ambulatory Visit (HOSPITAL_COMMUNITY): Admission: RE | Admit: 2020-06-04 | Payer: Medicare Other | Source: Ambulatory Visit

## 2020-06-05 ENCOUNTER — Other Ambulatory Visit: Payer: Self-pay | Admitting: Family Medicine

## 2020-06-05 DIAGNOSIS — F322 Major depressive disorder, single episode, severe without psychotic features: Secondary | ICD-10-CM

## 2020-06-05 MED ORDER — DULOXETINE HCL 60 MG PO CPEP: 120.0000 mg | ORAL_CAPSULE | Freq: Every day | ORAL | 3 refills | Status: DC

## 2020-06-05 NOTE — Telephone Encounter (Signed)
Okay to send in increase medication? Please advise

## 2020-06-05 NOTE — Telephone Encounter (Signed)
60 mg   , 2 po qd sent to pharmacy

## 2020-06-05 NOTE — Telephone Encounter (Signed)
Patient is requesting a 126m please, patient states she needs to increased dose due to her feet. Patient only has two pills left .    Medication: DULoxetine (CYMBALTA) 60 MG capsule [[[id number]]   Has the patient contacted their pharmacy? No. (If no, request that the patient contact the pharmacy for the refill.) (If yes, when and what did the pharmacy advise?)  Preferred Pharmacy (with phone number or street name): [id number] [place] [address]Phone:  [phone]:  [phone] #:  [id number]: Please be advised that RX refills may take up to 3 business days. We ask that you follow-up with your pharmacy.

## 2020-06-06 NOTE — Telephone Encounter (Signed)
Attempted to call patient. No answer or VM

## 2020-06-09 ENCOUNTER — Ambulatory Visit: Payer: Self-pay | Admitting: *Deleted

## 2020-06-13 ENCOUNTER — Ambulatory Visit (HOSPITAL_COMMUNITY): Payer: Medicare Other

## 2020-06-24 ENCOUNTER — Other Ambulatory Visit: Payer: Self-pay | Admitting: Gastroenterology

## 2020-06-24 ENCOUNTER — Ambulatory Visit (HOSPITAL_COMMUNITY)
Admission: RE | Admit: 2020-06-24 | Discharge: 2020-06-24 | Disposition: A | Discharge status: Home / Self Care | Payer: Medicare Other | Source: Ambulatory Visit | Attending: Gastroenterology | Admitting: Gastroenterology

## 2020-06-24 ENCOUNTER — Other Ambulatory Visit: Payer: Self-pay

## 2020-06-24 DIAGNOSIS — N281 Cyst of kidney, acquired: Secondary | ICD-10-CM | POA: Diagnosis not present

## 2020-06-24 DIAGNOSIS — Z9049 Acquired absence of other specified parts of digestive tract: Secondary | ICD-10-CM | POA: Diagnosis not present

## 2020-06-24 DIAGNOSIS — K5000 Crohn's disease of small intestine without complications: Secondary | ICD-10-CM

## 2020-06-24 DIAGNOSIS — Z90710 Acquired absence of both cervix and uterus: Secondary | ICD-10-CM | POA: Diagnosis not present

## 2020-06-24 DIAGNOSIS — K91850 Pouchitis: Secondary | ICD-10-CM | POA: Insufficient documentation

## 2020-06-24 DIAGNOSIS — K603 Anal fistula: Secondary | ICD-10-CM | POA: Insufficient documentation

## 2020-06-24 DIAGNOSIS — N83291 Other ovarian cyst, right side: Secondary | ICD-10-CM | POA: Diagnosis not present

## 2020-06-24 IMAGING — MR MR [PERSON_NAME] PELVIS W/CM
9 series · 48 of 48 positions shown · IV contrast (agent unspecified)
Comparison: MR pelvis [DATE]

CLINICAL DATA: Evaluate for possible Crohn's exacerbation. History
of total colectomy

EXAM:
MR ABDOMEN AND PELVIS WITHOUT CONTRAST (MR ENTEROGRAPHY)
TECHNIQUE: Multiplanar, multisequence MRI of the abdomen and pelvis was
performed both before and during bolus administration of intravenous
contrast. Negative oral contrast VoLumen was given.
CONTRAST:  None.  Patient refused.

[Series 2: T2 · axial · 6.0mm · 1.56mm/px · z∈[-242,+88]mm · 2 of 48 slices shown (1 of 2)]
[im 1/48]
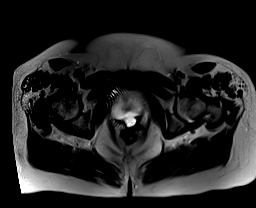
[im 48/48]
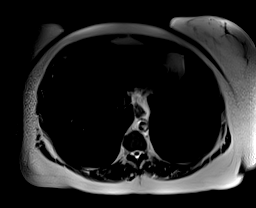

[Series 4: T2 fat-sat · axial · 6.0mm · 1.25mm/px · z∈[-241,+112]mm · 3 of 50 slices shown]
[im 1/50]
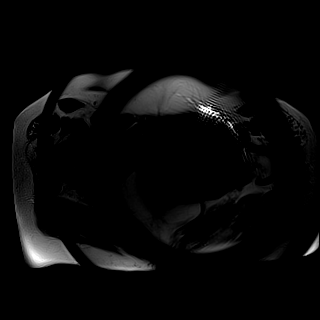
[im 25/50]
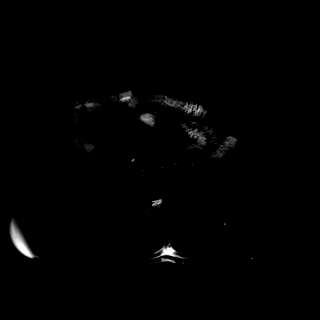
[im 50/50]
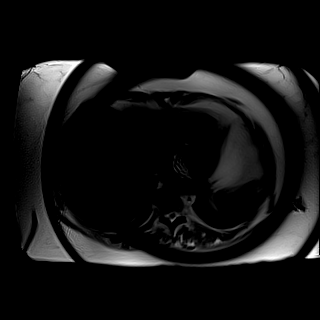

[Series 5: T2 · coronal · 6.0mm · 1.76mm/px · 3 of 41 slices shown (2 of 2)]
[im 1/41]
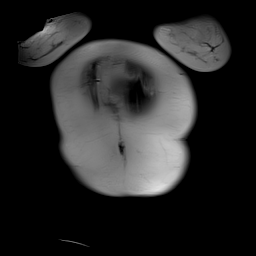
[im 21/41]
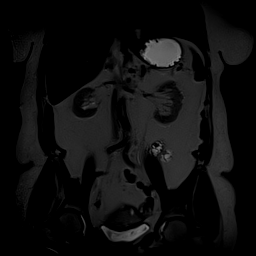
[im 41/41]
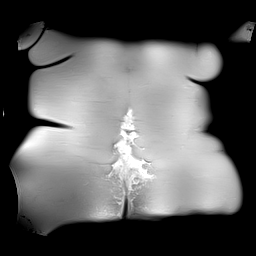

[Series 6: bSSFP · coronal · 6.0mm · 0.88mm/px · 3 of 40 slices shown]
[im 1/40]
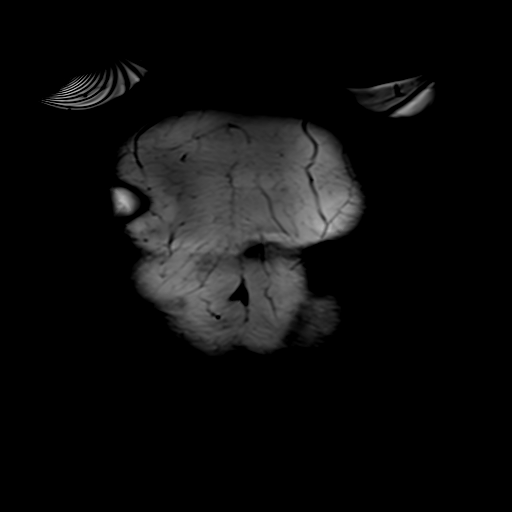
[im 20/40]
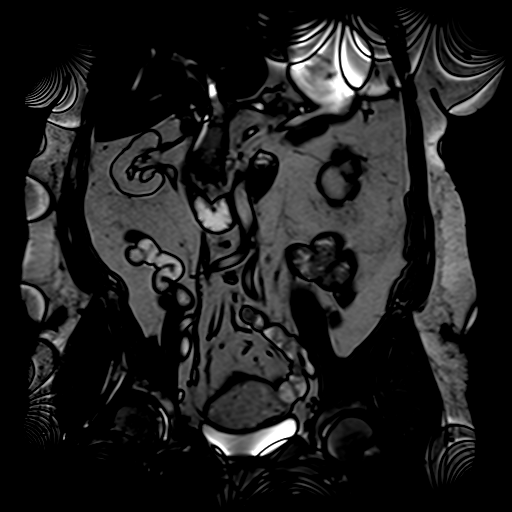
[im 40/40]
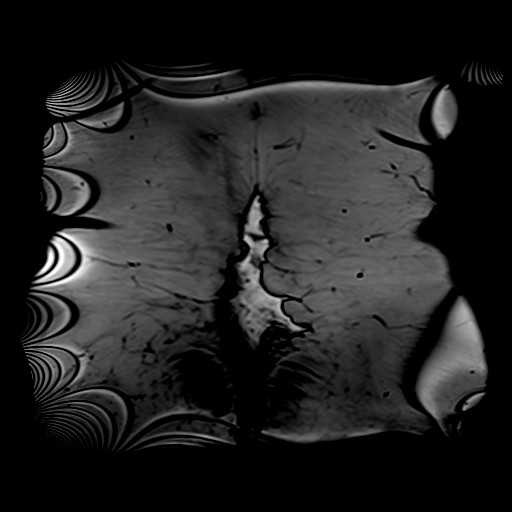

[Series 7: DWI · axial · 6.0mm · 1.49mm/px · z∈[-280,+80]mm · 7 of 102 slices shown (1 of 2)]
[im 1/102]
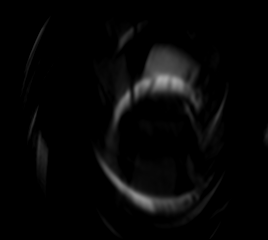
[im 17/102]
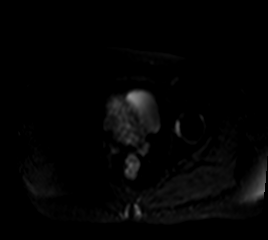
[im 34/102]
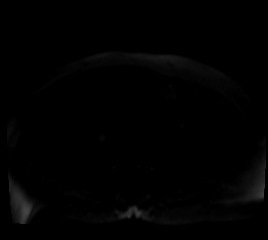
[im 51/102]
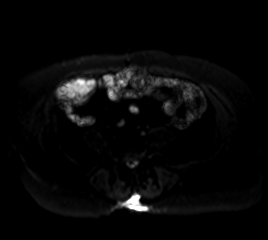
[im 68/102]
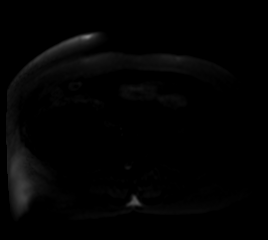
[im 85/102]
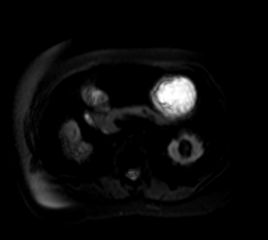
[im 102/102]
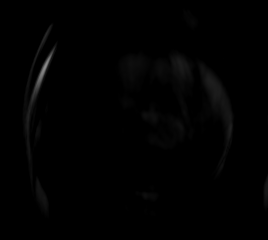

[Series 8: DWI · axial · 6.0mm · 1.49mm/px · z∈[-280,+80]mm · 3 of 51 slices shown (2 of 2)]
[im 1/51]
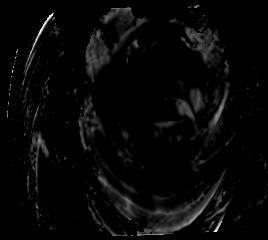
[im 26/51]
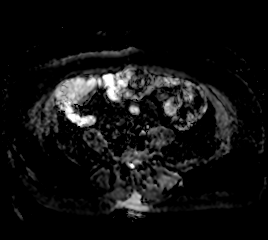
[im 51/51]
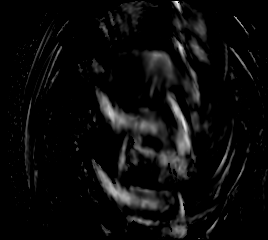

[Series 9: T1 dynamic · axial · 3.0mm · 1.25mm/px · z∈[-280,+101]mm · 9 of 128 slices shown (1 of 3)]
[im 1/128]
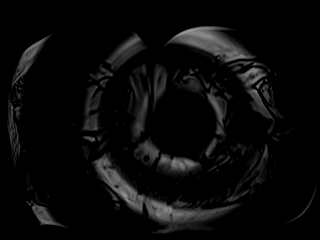
[im 16/128]
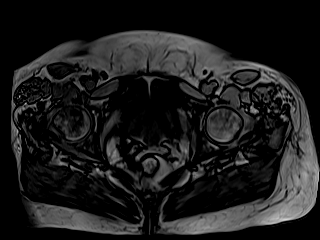
[im 32/128]
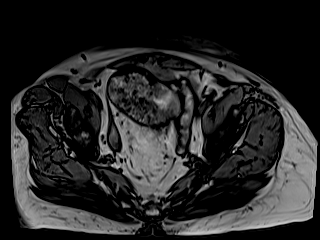
[im 48/128]
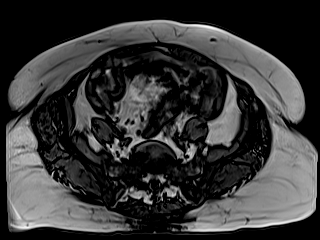
[im 64/128]
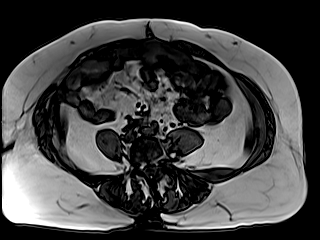
[im 80/128]
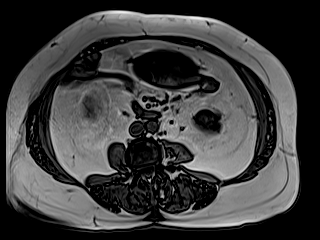
[im 96/128]
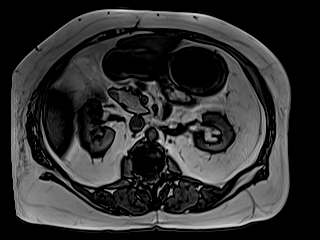
[im 112/128]
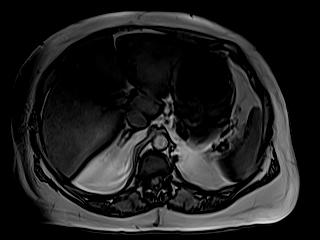
[im 128/128]
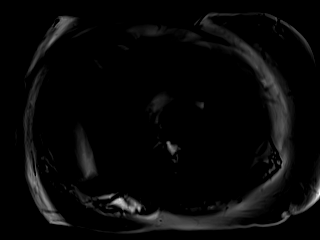

[Series 10: T1 dynamic · axial · 3.0mm · 1.25mm/px · z∈[-280,+101]mm · 9 of 128 slices shown (2 of 3)]
[im 1/128]
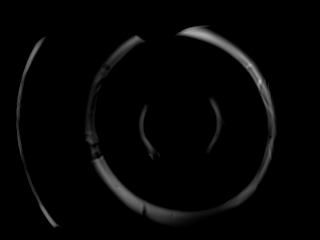
[im 16/128]
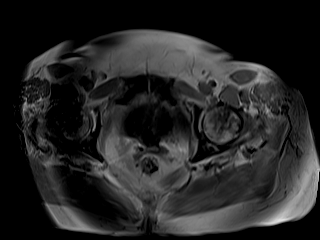
[im 32/128]
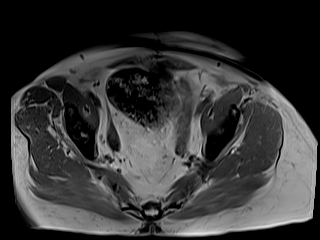
[im 48/128]
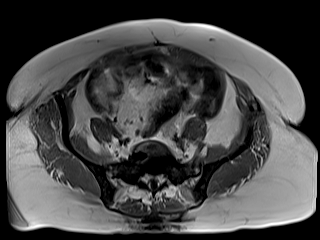
[im 64/128]
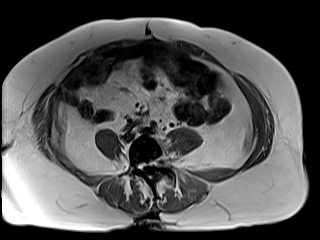
[im 80/128]
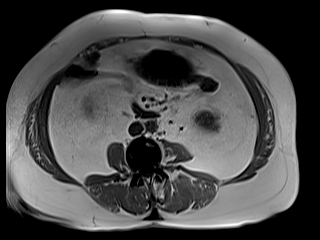
[im 96/128]
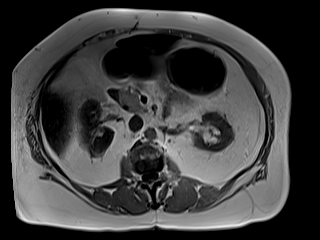
[im 112/128]
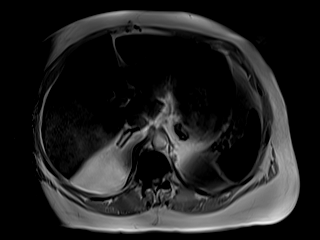
[im 128/128]
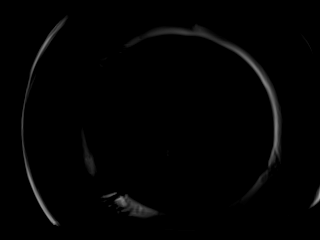

[Series 12: T1 dynamic · axial · 3.0mm · 1.25mm/px · z∈[-280,+101]mm · 9 of 128 slices shown (3 of 3)]
[im 1/128]
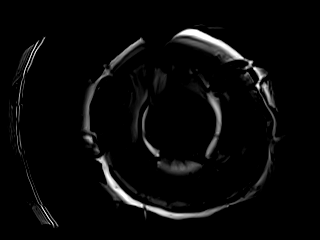
[im 16/128]
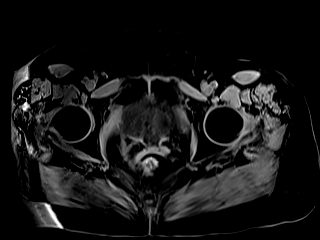
[im 32/128]
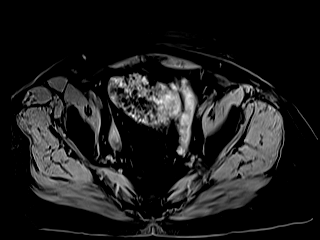
[im 48/128]
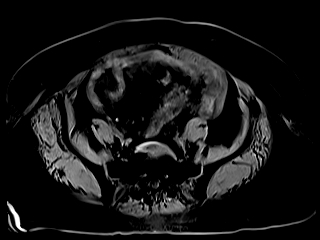
[im 64/128]
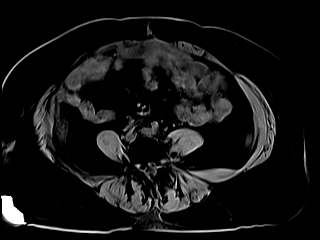
[im 80/128]
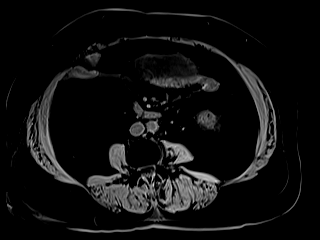
[im 96/128]
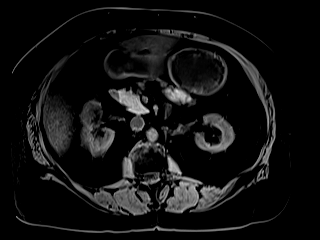
[im 112/128]
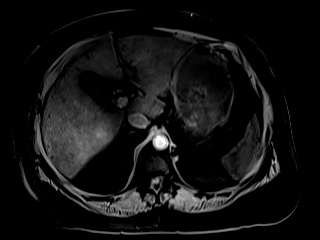
[im 128/128]
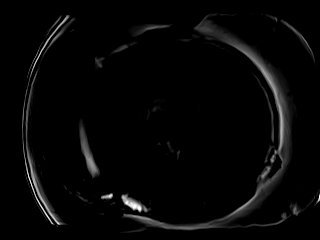

[48 of 48 positions shown; findings below may reference images not displayed]

FINDINGS: COMBINED FINDINGS FOR BOTH MR ABDOMEN AND PELVIS

Lower chest: The lung bases are clear of an acute process. No
pulmonary lesions, pleural or pericardial effusion.

Hepatobiliary: No hepatic lesions or intrahepatic biliary
dilatation. The gallbladder is surgically absent. No common bile
duct dilatation.

Pancreas:  No mass, inflammation or ductal dilatation.

Spleen:  Normal size.  No focal lesions but

Adrenals/Urinary Tract: Adrenal glands and kidneys are unremarkable.
Small renal cysts are noted. No hydronephrosis. The bladder is
grossly normal.

Stomach/Bowel: The stomach, duodenum and small bowel are
unremarkable. No acute inflammatory changes, mass lesions or
obstructive findings.

Status post colectomy with J-pouch. No obvious complicating features
are identified. No abscess. Some persistent inflammatory changes
near the upper gluteal cleft bilaterally between the gluteus maximus
muscles. It looks very similar to the prior pelvic MRI although a
definite fistula/fissure is not identified on this study. No
discrete drainable soft tissue abscess. No intramuscular
involvement.

Vascular/Lymphatic: No significant vascular findings. No adenopathy.

Reproductive: Status post hysterectomy. The right ovary is still
present and contains a small cyst.

Other:  No intrapelvic abscess or free fluid.

Musculoskeletal: No significant bony findings.
IMPRESSION: 1. Persistent inflammatory changes near the upper gluteal cleft
bilaterally between the gluteus maximus muscles. No discrete
drainable soft tissue abscess or intramuscular involvement.
2. No MR findings for acute inflammatory small-bowel disease.
However, study is limited without contrast.

## 2020-06-24 IMAGING — MR MR [PERSON_NAME] ABD W/CM
9 series · 48 of 48 positions shown · IV contrast (agent unspecified)
Comparison: MR pelvis [DATE]

CLINICAL DATA: Evaluate for possible Crohn's exacerbation. History
of total colectomy

EXAM:
MR ABDOMEN AND PELVIS WITHOUT CONTRAST (MR ENTEROGRAPHY)
TECHNIQUE: Multiplanar, multisequence MRI of the abdomen and pelvis was
performed both before and during bolus administration of intravenous
contrast. Negative oral contrast VoLumen was given.
CONTRAST:  None.  Patient refused.

[Series 2: T2 · axial · 6.0mm · 1.56mm/px · z∈[-242,+88]mm · 2 of 48 slices shown (1 of 2)]
[im 1/48]
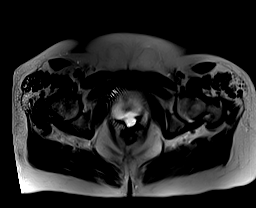
[im 48/48]
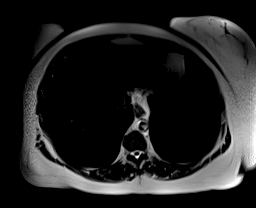

[Series 4: T2 fat-sat · axial · 6.0mm · 1.25mm/px · z∈[-241,+112]mm · 3 of 50 slices shown]
[im 1/50]
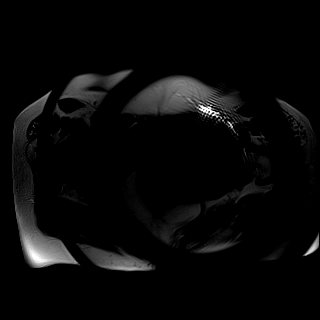
[im 25/50]
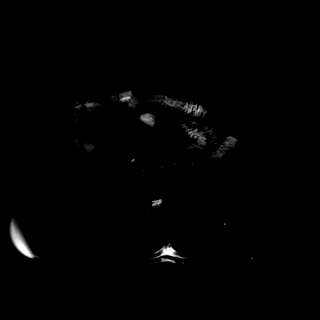
[im 50/50]
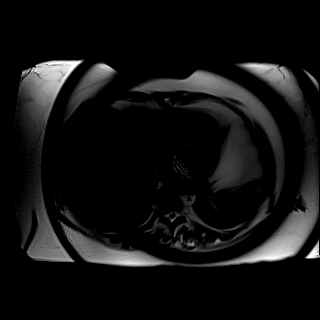

[Series 5: T2 · coronal · 6.0mm · 1.76mm/px · 3 of 41 slices shown (2 of 2)]
[im 1/41]
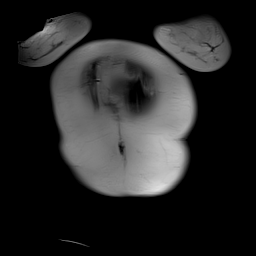
[im 21/41]
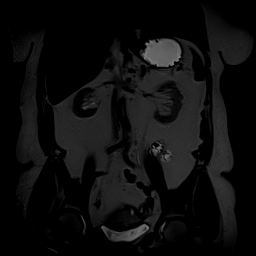
[im 41/41]
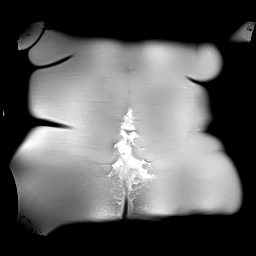

[Series 6: bSSFP · coronal · 6.0mm · 0.88mm/px · 3 of 40 slices shown]
[im 1/40]
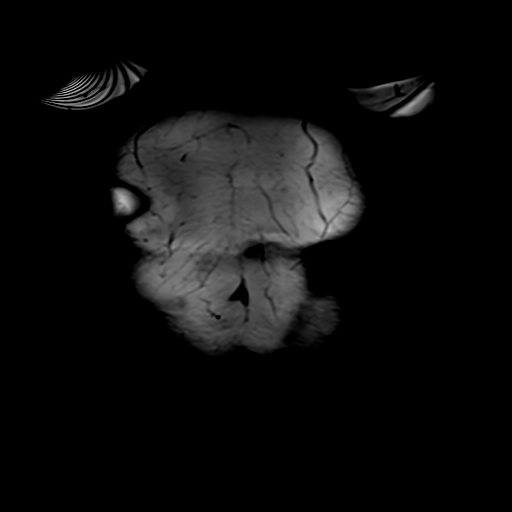
[im 20/40]
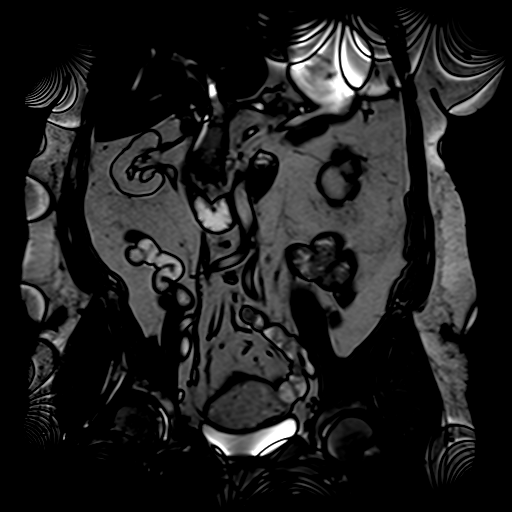
[im 40/40]
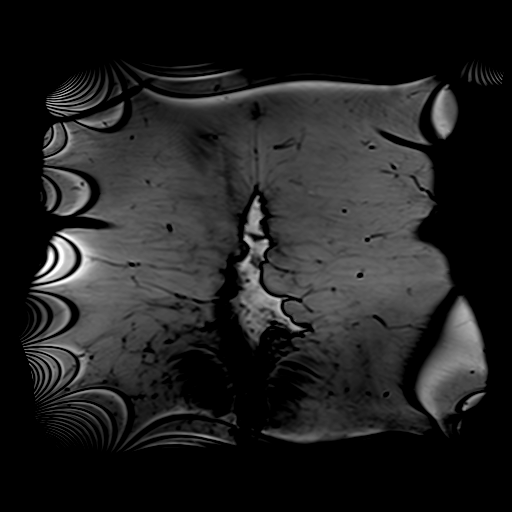

[Series 7: DWI · axial · 6.0mm · 1.49mm/px · z∈[-280,+80]mm · 7 of 102 slices shown (1 of 2)]
[im 1/102]
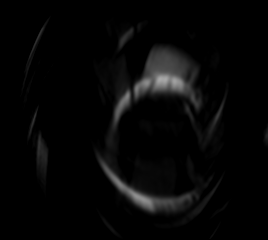
[im 17/102]
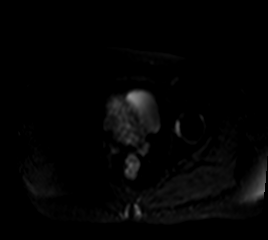
[im 34/102]
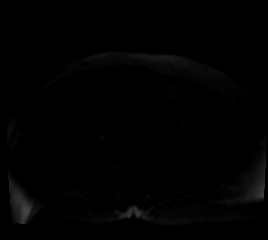
[im 51/102]
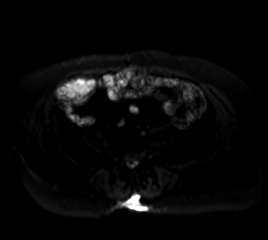
[im 68/102]
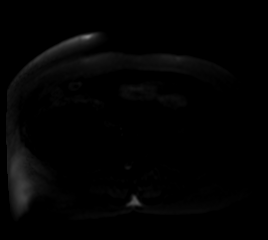
[im 85/102]
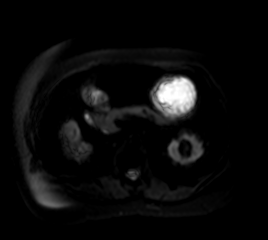
[im 102/102]
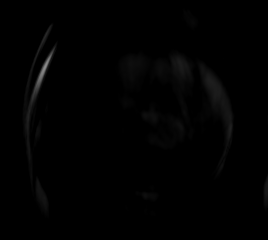

[Series 8: DWI · axial · 6.0mm · 1.49mm/px · z∈[-280,+80]mm · 3 of 51 slices shown (2 of 2)]
[im 1/51]
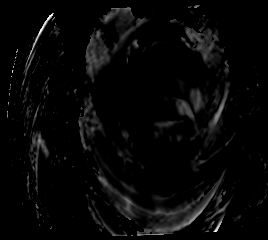
[im 26/51]
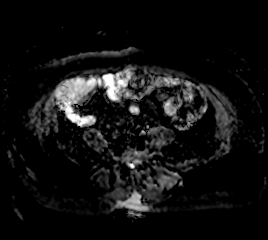
[im 51/51]
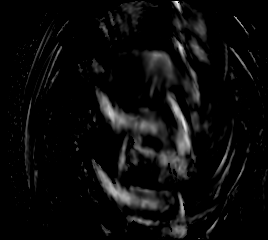

[Series 9: T1 dynamic · axial · 3.0mm · 1.25mm/px · z∈[-280,+101]mm · 9 of 128 slices shown (1 of 3)]
[im 1/128]
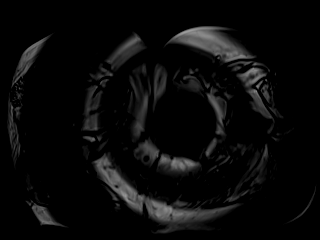
[im 16/128]
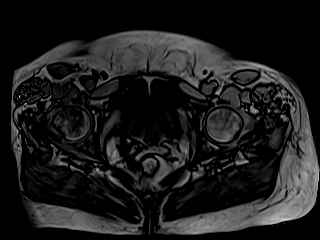
[im 32/128]
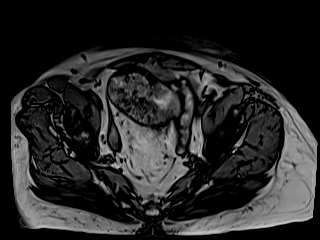
[im 48/128]
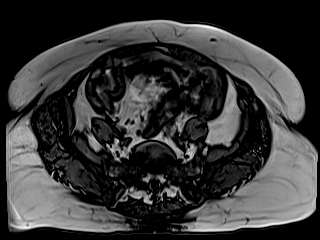
[im 64/128]
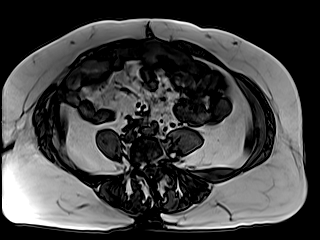
[im 80/128]
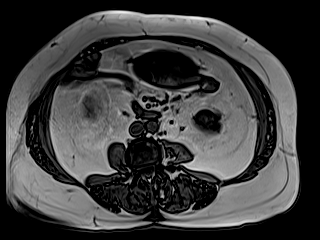
[im 96/128]
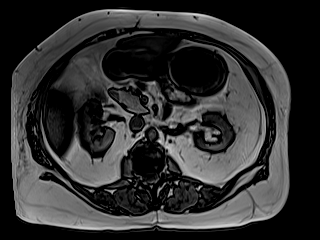
[im 112/128]
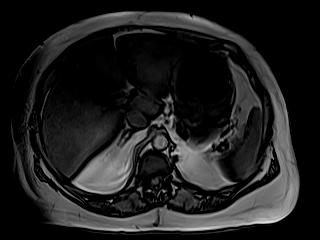
[im 128/128]
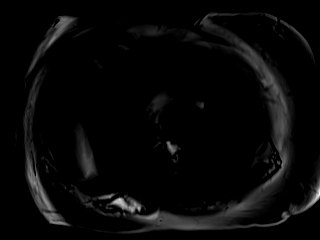

[Series 10: T1 dynamic · axial · 3.0mm · 1.25mm/px · z∈[-280,+101]mm · 9 of 128 slices shown (2 of 3)]
[im 1/128]
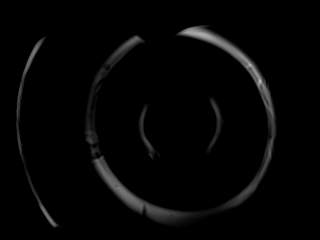
[im 16/128]
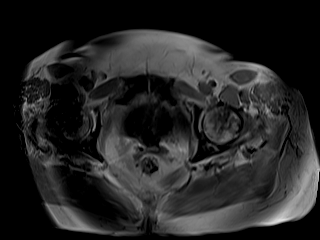
[im 32/128]
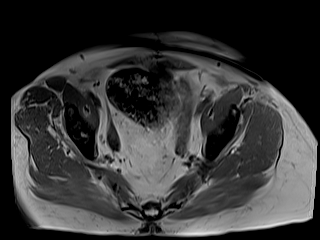
[im 48/128]
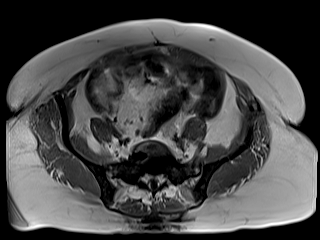
[im 64/128]
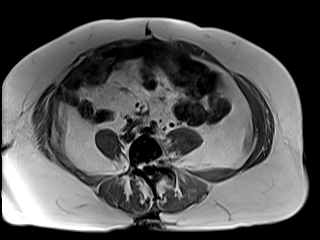
[im 80/128]
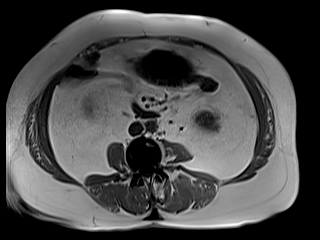
[im 96/128]
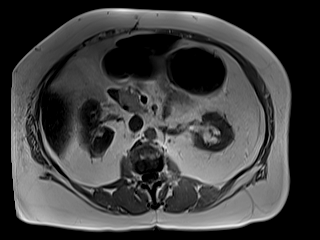
[im 112/128]
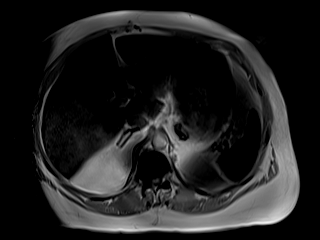
[im 128/128]
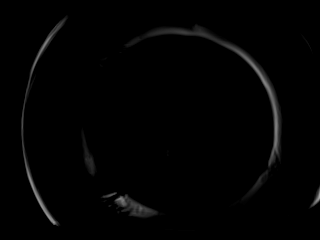

[Series 12: T1 dynamic · axial · 3.0mm · 1.25mm/px · z∈[-280,+101]mm · 9 of 128 slices shown (3 of 3)]
[im 1/128]
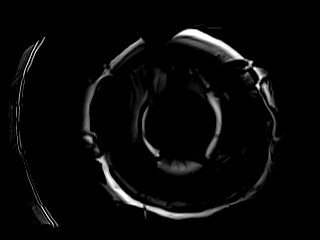
[im 16/128]
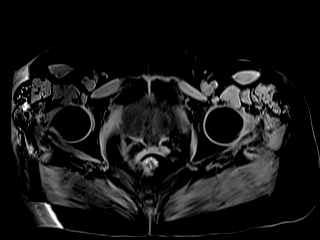
[im 32/128]
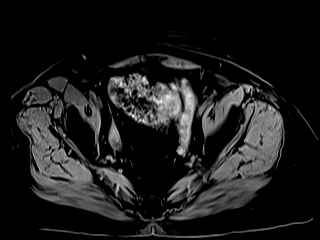
[im 48/128]
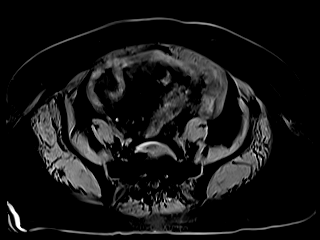
[im 64/128]
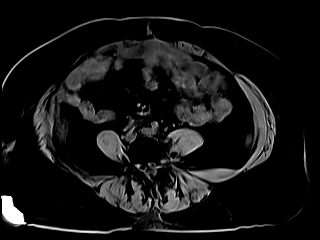
[im 80/128]
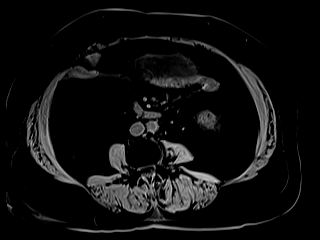
[im 96/128]
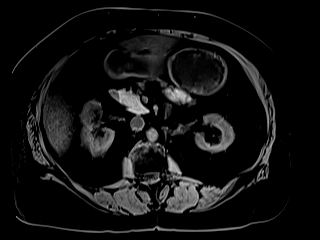
[im 112/128]
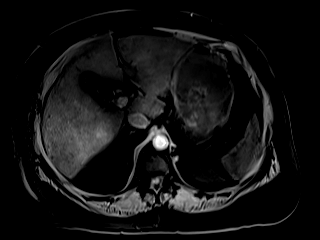
[im 128/128]
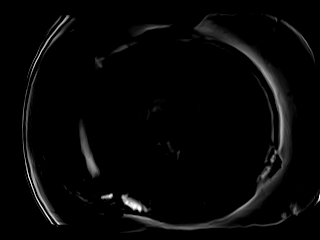

[48 of 48 positions shown; findings below may reference images not displayed]

FINDINGS: COMBINED FINDINGS FOR BOTH MR ABDOMEN AND PELVIS

Lower chest: The lung bases are clear of an acute process. No
pulmonary lesions, pleural or pericardial effusion.

Hepatobiliary: No hepatic lesions or intrahepatic biliary
dilatation. The gallbladder is surgically absent. No common bile
duct dilatation.

Pancreas:  No mass, inflammation or ductal dilatation.

Spleen:  Normal size.  No focal lesions but

Adrenals/Urinary Tract: Adrenal glands and kidneys are unremarkable.
Small renal cysts are noted. No hydronephrosis. The bladder is
grossly normal.

Stomach/Bowel: The stomach, duodenum and small bowel are
unremarkable. No acute inflammatory changes, mass lesions or
obstructive findings.

Status post colectomy with J-pouch. No obvious complicating features
are identified. No abscess. Some persistent inflammatory changes
near the upper gluteal cleft bilaterally between the gluteus maximus
muscles. It looks very similar to the prior pelvic MRI although a
definite fistula/fissure is not identified on this study. No
discrete drainable soft tissue abscess. No intramuscular
involvement.

Vascular/Lymphatic: No significant vascular findings. No adenopathy.

Reproductive: Status post hysterectomy. The right ovary is still
present and contains a small cyst.

Other:  No intrapelvic abscess or free fluid.

Musculoskeletal: No significant bony findings.
IMPRESSION: 1. Persistent inflammatory changes near the upper gluteal cleft
bilaterally between the gluteus maximus muscles. No discrete
drainable soft tissue abscess or intramuscular involvement.
2. No MR findings for acute inflammatory small-bowel disease.
However, study is limited without contrast.

## 2020-06-24 MED ORDER — GADOBUTROL 1 MMOL/ML IV SOLN: 5.0000 mL | Freq: Once | INTRAVENOUS | Status: DC | PRN

## 2020-06-24 MED ORDER — BARIUM SULFATE 0.1 % PO SUSP
450.0000 mL | Freq: Once | ORAL | Status: AC
  Administered 2020-06-24: 450 mL via ORAL

## 2020-06-24 MED ORDER — GADOBUTROL 1 MMOL/ML IV SOLN
0.1000 mL | Freq: Once | INTRAVENOUS | Status: AC | PRN
  Administered 2020-06-24: 0.1 mL via INTRAVENOUS

## 2020-06-24 MED ORDER — BARIUM SULFATE 0.1 % PO SUSP
900.0000 mL | Freq: Once | ORAL | Status: AC
  Administered 2020-06-24: 900 mL via ORAL

## 2020-06-25 ENCOUNTER — Ambulatory Visit (HOSPITAL_COMMUNITY): Payer: Medicare Other

## 2020-06-25 DIAGNOSIS — I129 Hypertensive chronic kidney disease with stage 1 through stage 4 chronic kidney disease, or unspecified chronic kidney disease: Secondary | ICD-10-CM | POA: Diagnosis not present

## 2020-06-25 DIAGNOSIS — N179 Acute kidney failure, unspecified: Secondary | ICD-10-CM | POA: Diagnosis not present

## 2020-06-25 DIAGNOSIS — K5090 Crohn's disease, unspecified, without complications: Secondary | ICD-10-CM | POA: Diagnosis not present

## 2020-06-25 DIAGNOSIS — M109 Gout, unspecified: Secondary | ICD-10-CM | POA: Diagnosis not present

## 2020-06-25 DIAGNOSIS — Z6831 Body mass index (BMI) 31.0-31.9, adult: Secondary | ICD-10-CM | POA: Diagnosis not present

## 2020-06-25 DIAGNOSIS — D509 Iron deficiency anemia, unspecified: Secondary | ICD-10-CM | POA: Diagnosis not present

## 2020-06-25 DIAGNOSIS — N184 Chronic kidney disease, stage 4 (severe): Secondary | ICD-10-CM | POA: Diagnosis not present

## 2020-06-25 DIAGNOSIS — E8881 Metabolic syndrome: Secondary | ICD-10-CM | POA: Diagnosis not present

## 2020-06-25 DIAGNOSIS — E876 Hypokalemia: Secondary | ICD-10-CM | POA: Diagnosis not present

## 2020-06-25 DIAGNOSIS — D631 Anemia in chronic kidney disease: Secondary | ICD-10-CM | POA: Diagnosis not present

## 2020-06-25 DIAGNOSIS — R768 Other specified abnormal immunological findings in serum: Secondary | ICD-10-CM | POA: Diagnosis not present

## 2020-06-25 DIAGNOSIS — N2581 Secondary hyperparathyroidism of renal origin: Secondary | ICD-10-CM | POA: Diagnosis not present

## 2020-07-01 ENCOUNTER — Other Ambulatory Visit: Payer: Self-pay | Admitting: Gastroenterology

## 2020-07-07 ENCOUNTER — Ambulatory Visit: Payer: Medicare Other | Admitting: Gastroenterology

## 2020-07-22 ENCOUNTER — Telehealth: Payer: Self-pay

## 2020-07-22 NOTE — Telephone Encounter (Signed)
Called and r/s missed 04/2020 appts per 11.15.21 inbasket message.  Lm with new appt and mailed calendar.    AOM

## 2020-07-30 DIAGNOSIS — A0471 Enterocolitis due to Clostridium difficile, recurrent: Secondary | ICD-10-CM | POA: Diagnosis not present

## 2020-07-30 DIAGNOSIS — K529 Noninfective gastroenteritis and colitis, unspecified: Secondary | ICD-10-CM | POA: Diagnosis not present

## 2020-07-30 DIAGNOSIS — Z882 Allergy status to sulfonamides status: Secondary | ICD-10-CM | POA: Diagnosis not present

## 2020-07-30 DIAGNOSIS — N183 Chronic kidney disease, stage 3 unspecified: Secondary | ICD-10-CM | POA: Diagnosis not present

## 2020-07-30 DIAGNOSIS — F32A Depression, unspecified: Secondary | ICD-10-CM | POA: Diagnosis not present

## 2020-07-30 DIAGNOSIS — M1990 Unspecified osteoarthritis, unspecified site: Secondary | ICD-10-CM | POA: Diagnosis not present

## 2020-07-30 DIAGNOSIS — R197 Diarrhea, unspecified: Secondary | ICD-10-CM | POA: Diagnosis not present

## 2020-07-30 DIAGNOSIS — E860 Dehydration: Secondary | ICD-10-CM | POA: Diagnosis not present

## 2020-07-30 DIAGNOSIS — M109 Gout, unspecified: Secondary | ICD-10-CM | POA: Diagnosis not present

## 2020-07-30 DIAGNOSIS — R918 Other nonspecific abnormal finding of lung field: Secondary | ICD-10-CM | POA: Diagnosis not present

## 2020-07-30 DIAGNOSIS — I129 Hypertensive chronic kidney disease with stage 1 through stage 4 chronic kidney disease, or unspecified chronic kidney disease: Secondary | ICD-10-CM | POA: Diagnosis not present

## 2020-07-30 DIAGNOSIS — Z79899 Other long term (current) drug therapy: Secondary | ICD-10-CM | POA: Diagnosis not present

## 2020-07-30 DIAGNOSIS — K5090 Crohn's disease, unspecified, without complications: Secondary | ICD-10-CM | POA: Diagnosis not present

## 2020-07-30 DIAGNOSIS — Z9049 Acquired absence of other specified parts of digestive tract: Secondary | ICD-10-CM | POA: Diagnosis not present

## 2020-07-30 DIAGNOSIS — N289 Disorder of kidney and ureter, unspecified: Secondary | ICD-10-CM | POA: Diagnosis not present

## 2020-07-30 DIAGNOSIS — N179 Acute kidney failure, unspecified: Secondary | ICD-10-CM | POA: Diagnosis not present

## 2020-07-30 DIAGNOSIS — I10 Essential (primary) hypertension: Secondary | ICD-10-CM | POA: Diagnosis not present

## 2020-07-30 DIAGNOSIS — I959 Hypotension, unspecified: Secondary | ICD-10-CM | POA: Diagnosis not present

## 2020-07-30 DIAGNOSIS — Z20822 Contact with and (suspected) exposure to covid-19: Secondary | ICD-10-CM | POA: Diagnosis not present

## 2020-07-30 DIAGNOSIS — K5010 Crohn's disease of large intestine without complications: Secondary | ICD-10-CM | POA: Diagnosis not present

## 2020-07-30 DIAGNOSIS — G2581 Restless legs syndrome: Secondary | ICD-10-CM | POA: Diagnosis not present

## 2020-07-30 DIAGNOSIS — D72829 Elevated white blood cell count, unspecified: Secondary | ICD-10-CM | POA: Diagnosis not present

## 2020-07-30 DIAGNOSIS — K219 Gastro-esophageal reflux disease without esophagitis: Secondary | ICD-10-CM | POA: Diagnosis not present

## 2020-07-31 DIAGNOSIS — K5090 Crohn's disease, unspecified, without complications: Secondary | ICD-10-CM | POA: Diagnosis not present

## 2020-07-31 DIAGNOSIS — K219 Gastro-esophageal reflux disease without esophagitis: Secondary | ICD-10-CM | POA: Diagnosis not present

## 2020-07-31 DIAGNOSIS — E860 Dehydration: Secondary | ICD-10-CM | POA: Diagnosis not present

## 2020-07-31 DIAGNOSIS — Z9049 Acquired absence of other specified parts of digestive tract: Secondary | ICD-10-CM | POA: Diagnosis not present

## 2020-07-31 DIAGNOSIS — N179 Acute kidney failure, unspecified: Secondary | ICD-10-CM | POA: Diagnosis not present

## 2020-07-31 DIAGNOSIS — I10 Essential (primary) hypertension: Secondary | ICD-10-CM | POA: Diagnosis not present

## 2020-07-31 DIAGNOSIS — Z79899 Other long term (current) drug therapy: Secondary | ICD-10-CM | POA: Diagnosis not present

## 2020-07-31 DIAGNOSIS — F32A Depression, unspecified: Secondary | ICD-10-CM | POA: Diagnosis not present

## 2020-07-31 DIAGNOSIS — M109 Gout, unspecified: Secondary | ICD-10-CM | POA: Diagnosis not present

## 2020-07-31 DIAGNOSIS — G2581 Restless legs syndrome: Secondary | ICD-10-CM | POA: Diagnosis not present

## 2020-07-31 DIAGNOSIS — R197 Diarrhea, unspecified: Secondary | ICD-10-CM | POA: Diagnosis not present

## 2020-08-01 DIAGNOSIS — M1990 Unspecified osteoarthritis, unspecified site: Secondary | ICD-10-CM | POA: Diagnosis not present

## 2020-08-01 DIAGNOSIS — R918 Other nonspecific abnormal finding of lung field: Secondary | ICD-10-CM | POA: Diagnosis not present

## 2020-08-01 DIAGNOSIS — M109 Gout, unspecified: Secondary | ICD-10-CM | POA: Diagnosis not present

## 2020-08-01 DIAGNOSIS — K5090 Crohn's disease, unspecified, without complications: Secondary | ICD-10-CM | POA: Diagnosis not present

## 2020-08-01 DIAGNOSIS — G2581 Restless legs syndrome: Secondary | ICD-10-CM | POA: Diagnosis not present

## 2020-08-01 DIAGNOSIS — I959 Hypotension, unspecified: Secondary | ICD-10-CM | POA: Diagnosis not present

## 2020-08-01 DIAGNOSIS — R197 Diarrhea, unspecified: Secondary | ICD-10-CM | POA: Diagnosis not present

## 2020-08-01 DIAGNOSIS — I129 Hypertensive chronic kidney disease with stage 1 through stage 4 chronic kidney disease, or unspecified chronic kidney disease: Secondary | ICD-10-CM | POA: Diagnosis not present

## 2020-08-01 DIAGNOSIS — Z882 Allergy status to sulfonamides status: Secondary | ICD-10-CM | POA: Diagnosis not present

## 2020-08-01 DIAGNOSIS — I10 Essential (primary) hypertension: Secondary | ICD-10-CM | POA: Diagnosis not present

## 2020-08-01 DIAGNOSIS — K5190 Ulcerative colitis, unspecified, without complications: Secondary | ICD-10-CM | POA: Diagnosis not present

## 2020-08-01 DIAGNOSIS — E860 Dehydration: Secondary | ICD-10-CM | POA: Diagnosis not present

## 2020-08-01 DIAGNOSIS — R911 Solitary pulmonary nodule: Secondary | ICD-10-CM | POA: Diagnosis not present

## 2020-08-01 DIAGNOSIS — Z9049 Acquired absence of other specified parts of digestive tract: Secondary | ICD-10-CM | POA: Diagnosis not present

## 2020-08-01 DIAGNOSIS — N179 Acute kidney failure, unspecified: Secondary | ICD-10-CM | POA: Diagnosis not present

## 2020-08-01 DIAGNOSIS — A0471 Enterocolitis due to Clostridium difficile, recurrent: Secondary | ICD-10-CM | POA: Diagnosis not present

## 2020-08-01 DIAGNOSIS — K5010 Crohn's disease of large intestine without complications: Secondary | ICD-10-CM | POA: Diagnosis not present

## 2020-08-01 DIAGNOSIS — K219 Gastro-esophageal reflux disease without esophagitis: Secondary | ICD-10-CM | POA: Diagnosis not present

## 2020-08-01 DIAGNOSIS — N183 Chronic kidney disease, stage 3 unspecified: Secondary | ICD-10-CM | POA: Diagnosis not present

## 2020-08-01 DIAGNOSIS — Z20822 Contact with and (suspected) exposure to covid-19: Secondary | ICD-10-CM | POA: Diagnosis not present

## 2020-08-01 DIAGNOSIS — Z79899 Other long term (current) drug therapy: Secondary | ICD-10-CM | POA: Diagnosis not present

## 2020-08-01 DIAGNOSIS — F32A Depression, unspecified: Secondary | ICD-10-CM | POA: Diagnosis not present

## 2020-08-12 ENCOUNTER — Other Ambulatory Visit: Payer: Self-pay

## 2020-08-12 ENCOUNTER — Ambulatory Visit (INDEPENDENT_AMBULATORY_CARE_PROVIDER_SITE_OTHER): Payer: Medicare Other | Admitting: Family Medicine

## 2020-08-12 ENCOUNTER — Telehealth: Payer: Self-pay

## 2020-08-12 ENCOUNTER — Encounter: Payer: Self-pay | Admitting: Family Medicine

## 2020-08-12 VITALS — BP 114/90 | HR 93 | Temp 97.6°F | Resp 20 | Ht 63.0 in | Wt 195.0 lb

## 2020-08-12 DIAGNOSIS — A0472 Enterocolitis due to Clostridium difficile, not specified as recurrent: Secondary | ICD-10-CM

## 2020-08-12 LAB — COMPREHENSIVE METABOLIC PANEL
ALT: 17 U/L (ref 0–35)
AST: 17 U/L (ref 0–37)
Albumin: 4.5 g/dL (ref 3.5–5.2)
Alkaline Phosphatase: 110 U/L (ref 39–117)
BUN: 54 mg/dL — ABNORMAL HIGH (ref 6–23)
CO2: 18 mEq/L — ABNORMAL LOW (ref 19–32)
Calcium: 10.5 mg/dL (ref 8.4–10.5)
Chloride: 105 mEq/L (ref 96–112)
Creatinine, Ser: 3 mg/dL — ABNORMAL HIGH (ref 0.40–1.20)
GFR: 15.69 mL/min — ABNORMAL LOW (ref >60.00)
Glucose, Bld: 105 mg/dL — ABNORMAL HIGH (ref 70–99)
Potassium: 4.8 mEq/L (ref 3.5–5.1)
Sodium: 133 mEq/L — ABNORMAL LOW (ref 135–145)
Total Bilirubin: 0.4 mg/dL (ref 0.2–1.2)
Total Protein: 8.4 g/dL — ABNORMAL HIGH (ref 6.0–8.3)

## 2020-08-12 LAB — CBC WITH DIFFERENTIAL/PLATELET
Basophils Absolute: 0.1 10*3/uL (ref 0.0–0.1)
Basophils Relative: 0.6 % (ref 0.0–3.0)
Eosinophils Absolute: 0.7 10*3/uL (ref 0.0–0.7)
Eosinophils Relative: 4.5 % (ref 0.0–5.0)
HCT: 40 % (ref 36.0–46.0)
Hemoglobin: 12.9 g/dL (ref 12.0–15.0)
Lymphocytes Relative: 19 % (ref 12.0–46.0)
Lymphs Abs: 2.9 10*3/uL (ref 0.7–4.0)
MCHC: 32.2 g/dL (ref 30.0–36.0)
MCV: 103.2 fl — ABNORMAL HIGH (ref 78.0–100.0)
Monocytes Absolute: 0.9 10*3/uL (ref 0.1–1.0)
Monocytes Relative: 5.8 % (ref 3.0–12.0)
Neutro Abs: 10.7 10*3/uL — ABNORMAL HIGH (ref 1.4–7.7)
Neutrophils Relative %: 70.1 % (ref 43.0–77.0)
Platelets: 392 10*3/uL (ref 150.0–400.0)
RBC: 3.88 Mil/uL (ref 3.87–5.11)
RDW: 15.4 % (ref 11.5–15.5)
WBC: 15.3 10*3/uL — ABNORMAL HIGH (ref 4.0–10.5)

## 2020-08-12 MED ORDER — DIFICID 200 MG PO TABS: 200.0000 mg | ORAL_TABLET | Freq: Two times a day (BID) | ORAL | 0 refills | Status: DC

## 2020-08-12 NOTE — Telephone Encounter (Signed)
Attempted to call pt. [name] and advised to call back with any questions

## 2020-08-12 NOTE — Progress Notes (Signed)
Patient ID: [name], female    DOB: 06-Nov-1955  Age: 64 y.o. MRN: [id number]    Subjective:  Subjective  HPI [name] presents for f/u hosp for c diff.  She was given vanco in hosp and d/c with po--- she has 2 days left.   Friday she started having diarrhea again.  She said that the hosp told her not to take flagyl,   She stopped her loperamide a while ago and probiotic.  She has no fever, no abd pain and no blood.  She feels this is like her reg c diff episodes.     Review of Systems  Constitutional: Positive for fatigue. Negative for appetite change, diaphoresis and unexpected weight change.  Eyes: Negative for pain, redness and visual disturbance.  Respiratory: Negative for cough, chest tightness, shortness of breath and wheezing.   Cardiovascular: Negative for chest pain, palpitations and leg swelling.  Gastrointestinal: Positive for diarrhea. Negative for abdominal distention, abdominal pain, anal bleeding, blood in stool, constipation and rectal pain.  Endocrine: Negative for cold intolerance, heat intolerance, polydipsia, polyphagia and polyuria.  Genitourinary: Negative for difficulty urinating, dysuria and frequency.  Neurological: Negative for dizziness, light-headedness, numbness and headaches.    History Past Medical History:  Diagnosis Date  . Abdominal pain    Hx  . Allergy   . Anal stenosis   . Anemia   . Anxiety   . Arthritis   . Asthma    patient does not have inhaler  . Blood in stool    Hx  . Blood in urine    Hx  . Blood transfusion without reported diagnosis   . Cataract   . CKD (chronic kidney disease) stage 3, GFR 30-59 ml/min (HCC)   . Crohn's colitis, other complication ([place])   . De Quervain's tenosynovitis   . Depression   . Difficulty urinating    Hx  . Easy bruising   . Esophagitis   . Fainting    History - resolved - due to dehydration  . Fatigue    Hx  . Fibroid    Hx  . Gastric polyp   . GERD (gastroesophageal reflux  disease)   . 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 Hemorrhoids, external   . Hemorrhoids, internal   . Herpes, genital    vaginal treated 07/05/12 and pt states is resolved  . History of cervical dysplasia   . History of small bowel obstruction   . Hyperlipidemia    currently no meds  . Hyperparathyroidism   . Hypertension   . Hypokalemia    Hx  . 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 Obesity   . Ovarian cyst   . Poor dental hygiene   . Pulmonary nodule, right    right upper lobe  . Rectal bleeding    Hx  . Rectal pain    Hx  . Renal insufficiency    CKD - stage 3  . RLS (restless legs syndrome)    no meds  . Tooth infection 11/2016   right low  . Ulcerative colitis   . Visual disturbance    wears glasses  . Weakness generalized    Hx - patient denies generalized weakness  . Wears dentures    upper only    She has a past surgical history that includes  Anal dilation; Restorative proctocolectomy; Ileostomy closure; Cholecystectomy; Tubal ligation; Hemorrhoid surgery; fatty tumor removed from back; Cervical biopsy w/ loop electrode excision; Upper gastrointestinal endoscopy; Colonoscopy; Sigmoidoscopy; Total abdominal hysterectomy (1998); and Shoulder arthroscopy with capsulorrhaphy (Left, 06/14/2019).   Her family history includes Cancer in her maternal uncle and sister; Diabetes in her sister; Heart attack in her father; Heart disease in her mother; Hypertension in her father and mother; Irritable bowel syndrome in an other family member; Ulcerative colitis in her daughter and father.She reports that she quit smoking about 41 years ago. Her smoking use included cigarettes. She has a 4.00 pack-year smoking history. She has never used smokeless tobacco. She reports that she does not drink alcohol and does not use drugs.  Current Outpatient Medications on File Prior  to Visit  Medication Sig Dispense Refill  . allopurinol (ZYLOPRIM) 300 MG tablet Take 1 tablet (300 mg total) by mouth daily. 90 tablet 1  . AMBULATORY NON FORMULARY MEDICATION Medication Name: Nitroglycerine ointment 0.125 %  Apply a pea sized amount internally four times daily. Dispense 30 GM zero refill 30 g 3  . calcium carbonate (TUMS - DOSED IN MG ELEMENTAL CALCIUM) 500 MG chewable tablet Chew 1 tablet by mouth daily as needed for indigestion or heartburn.    . DULoxetine (CYMBALTA) 60 MG capsule Take 2 capsules (120 mg total) by mouth daily. 180 capsule 3  . Ergocalciferol (VITAMIN D2) 2000 units TABS Take 2,000 Units by mouth every 30 (thirty) days.     . famotidine (PEPCID) 20 MG tablet TAKE 1 TABLET BY MOUTH TWICE A DAY 102 tablet 0  . folic acid (FOLVITE) 1 MG tablet Take 2 tablets (2 mg total) by mouth daily. 180 tablet 3  . gabapentin (NEURONTIN) 100 MG capsule TAKE 1 CAPSULE BY MOUTH THREE TIMES A DAY (Patient taking differently: Take 100 mg by mouth 2 (two) times daily. ) 90 capsule 3  . metoprolol succinate (TOPROL-XL) 100 MG 24 hr tablet Take 50 mg by mouth 2 (two) times daily. Take 100 mg in the a.m. and 50 mg in the evening    . Probiotic Product (PROBIOTIC PO) Take by mouth.    . simethicone (GAS-X) 80 MG chewable tablet Chew 80 mg by mouth daily as needed [employer]).     . vancomycin (VANCOCIN) 125 MG capsule Take by mouth.    . vedolizumab (ENTYVIO) 300 MG injection Inject 300 mg into the vein every 30 (thirty) days.    . Zinc 50 MG TABS Take by mouth.     No current facility-administered medications on file prior to visit.     Objective:  Objective  Physical Exam Vitals and nursing note reviewed.  Constitutional:      Appearance: She is well-developed.  HENT:     Head: Normocephalic and atraumatic.  Eyes:     Conjunctiva/sclera: Conjunctivae normal.  Neck:     Thyroid: No thyromegaly.     Vascular: No carotid bruit or JVD.  Cardiovascular:     Rate and Rhythm:  Normal rate and regular rhythm.     Heart sounds: Normal heart sounds. No murmur heard.   Pulmonary:     Effort: Pulmonary effort is normal. No respiratory distress.     Breath sounds: Normal breath sounds. No wheezing or rales.  Chest:     Chest wall: No tenderness.  Abdominal:     General: There is no distension.     Tenderness: There is no abdominal tenderness. There is no guarding or  rebound.  Musculoskeletal:     Cervical back: Normal range of motion and neck supple.  Neurological:     Mental Status: She is alert and oriented to person, place, and time.    BP 114/90 (BP Location: Right Arm, Patient Position: Sitting, Cuff Size: Large)   Pulse 93   Temp 97.6 F (36.4 C) (Oral)   Resp 20   Ht 5' 3"  (1.6 m)   Wt 195 lb (88.5 kg)   SpO2 98%   BMI 34.54 kg/m  Wt Readings from Last 3 Encounters:  08/12/20 195 lb (88.5 kg)  05/27/20 201 lb (91.2 kg)  02/26/20 205 lb 6.4 oz (93.2 kg)     Lab Results  Component Value Date   WBC 15.3 (H) 08/12/2020   HGB 12.9 08/12/2020   HCT 40.0 08/12/2020   PLT 392.0 08/12/2020   GLUCOSE 105 (H) 08/12/2020   CHOL 190 02/26/2020   TRIG 152.0 (H) 02/26/2020   HDL 43.50 02/26/2020   LDLDIRECT 108.0 12/17/2019   LDLCALC 116 (H) 02/26/2020   ALT 17 08/12/2020   AST 17 08/12/2020   NA 133 (L) 08/12/2020   K 4.8 08/12/2020   CL 105 08/12/2020   CREATININE 3.00 (H) 08/12/2020   BUN 54 (H) 08/12/2020   CO2 18 (L) 08/12/2020   TSH 4.18 02/26/2020   INR 0.95 09/02/2017   HGBA1C 6.0 02/26/2020    MR ENTERO ABDOMEN W WO CONTRAST  Result Date: 06/25/2020 CLINICAL DATA:  Evaluate for possible Crohn's exacerbation. History of total colectomy EXAM: MR ABDOMEN AND PELVIS WITHOUT CONTRAST (MR ENTEROGRAPHY) TECHNIQUE: Multiplanar, multisequence MRI of the abdomen and pelvis was performed both before and during bolus administration of intravenous contrast. Negative oral contrast VoLumen was given. CONTRAST:  None.  Patient refused. COMPARISON:   MR pelvis 02/14/2020 FINDINGS: COMBINED FINDINGS FOR BOTH MR ABDOMEN AND PELVIS Lower chest: The lung bases are clear of an acute process. No pulmonary lesions, pleural or pericardial effusion. Hepatobiliary: No hepatic lesions or intrahepatic biliary dilatation. The gallbladder is surgically absent. No common bile duct dilatation. Pancreas:  No mass, inflammation or ductal dilatation. Spleen:  Normal size.  No focal lesions but Adrenals/Urinary Tract: Adrenal glands and kidneys are unremarkable. Small renal cysts are noted. No hydronephrosis. The bladder is grossly normal. Stomach/Bowel: The stomach, duodenum and small bowel are unremarkable. No acute inflammatory changes, mass lesions or obstructive findings. Status post colectomy with J-pouch. No obvious complicating features are identified. No abscess. Some persistent inflammatory changes near the upper gluteal cleft bilaterally between the gluteus maximus muscles. It looks very similar to the prior pelvic MRI although a definite fistula/fissure is not identified on this study. No discrete drainable soft tissue abscess. No intramuscular involvement. Vascular/Lymphatic: No significant vascular findings. No adenopathy. Reproductive: Status post hysterectomy. The right ovary is still present and contains a small cyst. Other:  No intrapelvic abscess or free fluid. Musculoskeletal: No significant bony findings. IMPRESSION: 1. Persistent inflammatory changes near the upper gluteal cleft bilaterally between the gluteus maximus muscles. No discrete drainable soft tissue abscess or intramuscular involvement. 2. No MR findings for acute inflammatory small-bowel disease. However, study is limited without contrast. Electronically Signed   By: Marijo Sanes M.D.   On: 06/25/2020 13:43   MR ENTERO PELVIS W WO CONTRAST  Result Date: 06/25/2020 CLINICAL DATA:  Evaluate for possible Crohn's exacerbation. History of total colectomy EXAM: MR ABDOMEN AND PELVIS WITHOUT  CONTRAST (MR ENTEROGRAPHY) TECHNIQUE: Multiplanar, multisequence MRI of the abdomen and pelvis was performed  both before and during bolus administration of intravenous contrast. Negative oral contrast VoLumen was given. CONTRAST:  None.  Patient refused. COMPARISON:  MR pelvis 02/14/2020 FINDINGS: COMBINED FINDINGS FOR BOTH MR ABDOMEN AND PELVIS Lower chest: The lung bases are clear of an acute process. No pulmonary lesions, pleural or pericardial effusion. Hepatobiliary: No hepatic lesions or intrahepatic biliary dilatation. The gallbladder is surgically absent. No common bile duct dilatation. Pancreas:  No mass, inflammation or ductal dilatation. Spleen:  Normal size.  No focal lesions but Adrenals/Urinary Tract: Adrenal glands and kidneys are unremarkable. Small renal cysts are noted. No hydronephrosis. The bladder is grossly normal. Stomach/Bowel: The stomach, duodenum and small bowel are unremarkable. No acute inflammatory changes, mass lesions or obstructive findings. Status post colectomy with J-pouch. No obvious complicating features are identified. No abscess. Some persistent inflammatory changes near the upper gluteal cleft bilaterally between the gluteus maximus muscles. It looks very similar to the prior pelvic MRI although a definite fistula/fissure is not identified on this study. No discrete drainable soft tissue abscess. No intramuscular involvement. Vascular/Lymphatic: No significant vascular findings. No adenopathy. Reproductive: Status post hysterectomy. The right ovary is still present and contains a small cyst. Other:  No intrapelvic abscess or free fluid. Musculoskeletal: No significant bony findings. IMPRESSION: 1. Persistent inflammatory changes near the upper gluteal cleft bilaterally between the gluteus maximus muscles. No discrete drainable soft tissue abscess or intramuscular involvement. 2. No MR findings for acute inflammatory small-bowel disease. However, study is limited without  contrast. Electronically Signed   By: Marijo Sanes M.D.   On: 06/25/2020 13:43     Assessment & Plan:  Plan  I have discontinued Florestine A. Connell's Biotin, hydrALAZINE, loperamide, metFORMIN, and buPROPion. I am also having her start on Dificid. Additionally, I am having her maintain her folic acid, simethicone, Vitamin D2, AMBULATORY NON FORMULARY MEDICATION, metoprolol succinate, Entyvio, calcium carbonate, Probiotic Product (PROBIOTIC PO), Zinc, allopurinol, gabapentin, DULoxetine, famotidine, and vancomycin.  Meds ordered this encounter  Medications  . fidaxomicin (DIFICID) 200 MG TABS tablet    Sig: Take 1 tablet (200 mg total) by mouth 2 (two) times daily.    Dispense:  20 tablet    Refill:  0    Problem List Items Addressed This Visit    None    Visit Diagnoses    Clostridioides difficile diarrhea    -  Primary   Relevant Medications   vancomycin (VANCOCIN) 125 MG capsule   fidaxomicin (DIFICID) 200 MG TABS tablet   Other Relevant Orders   CBC with Differential/Platelet (Completed)   Comprehensive metabolic panel (Completed)    d/c vanco and start dificid---- discussed with GI ---Pt will restart loperamide and probiotic   Follow-up: Return if symptoms worsen or fail to improve.  Ann Held, DO

## 2020-08-12 NOTE — Telephone Encounter (Signed)
-----   Message from Ann Held, DO sent at 08/12/2020  1:16 PM EST ----- Let pt know that dr Havery Moros got back to me and recommended we switch the abx I have sent the new abx to the pharmacy----- stop the vancomycin

## 2020-08-12 NOTE — Patient Instructions (Signed)
Clostridioides Difficile Infection Clostridioides difficile, or C. diff, infection is caused by germs (bacteria). It causes irritation and swelling of the colon (colitis). This infection can spread from person to person (is contagious). You may also get C. diff from food or water, or from touching surfaces that have the germs on them. What are the causes? Certain germs live in the colon and help to digest food. This infection starts when the balance of helpful germs in the colon changes, and the C. diff germs grow out of control. This is caused by taking antibiotics. What increases the risk? Your risk is higher if you:  Take certain antibiotics that kill many types of germs.  Take antibiotics for a long time.  Stay for a long time in a health care setting, such as: ? A hospital. ? A long-term care facility.  Are older than age 82. Your risk is somewhat higher if you:  Have had C. diff infection before or had contact with C. diff germs.  Have a weak body defense system (immune system).  Take a medicine for a long time that reduces stomach acid. This includes proton pump inhibitors.  Have serious health problems, such as: ? Colon cancer. ? Inflammatory bowel disease (IBD).  Have had a procedure or surgery on your gastrointestinal (GI) tract. Some people develop C. diff even though they are not clearly at risk. What are the signs or symptoms?  Watery poop (diarrhea).  Fever.  Tiredness (fatigue).  Loss of appetite.  Nausea.  Swelling, pain, cramping, or tenderness in your belly (abdomen). How is this treated?  Stopping the antibiotics that you were taking when the C. diff infection began. Do this only as told by your doctor.  Taking certain antibiotics to stop C. diff from growing.  Taking donor poop (stool) from a healthy person and placing it into the colon. This may be done if the infection keeps coming back.  Having surgery to remove the infected part of the  colon. This is rare. Follow these instructions at home: Medicines  Take over-the-counter and prescription medicines only as told by your doctor.  Take your antibiotic medicine as told by your doctor. Do not stop taking the antibiotic even if you start to feel better.  Do not treat watery poop with medicines unless your doctor tells you to. Eating and drinking   Follow instructions from your doctor about eating or drinking.  Eat bland foods in small amounts as you are able. These foods include: ? Bananas. ? Applesauce. ? Rice. ? Low-fat (lean) meats. ? Toast. ? Crackers.  Follow your doctor's instructions on how to get enough fluids into your body. You may need to: ? Drink clear fluids. This includes water, fruit juice you have added water to, or low-calorie sports drinks. ? Suck on ice chips. ? Take an ORS (oral rehydration solution).  Avoid milk, caffeine, and alcohol.  Drink enough fluid to keep your pee (urine) pale yellow. Activity  Rest as told by your doctor.  Return to your normal activities as told by your doctor. Ask your doctor what activities are safe for you. General instructions  Wash your hands often with soap and water. Do this for at least 20 seconds. Bathe using soap and water daily.  Be sure your home is clean before you leave the hospital or clinic to go home.  Continue daily cleaning for at least a week after going home.  Keep all follow-up visits as told by your doctor. This is important.  How is this prevented? Hand hygiene   Wash your hands well before you cook and after you use the bathroom. Use soap and water for at least 20 seconds. Make sure that people who live with you also wash their hands often.  If you are being treated at a hospital or clinic, make sure that: ? All doctors and nurses wash their hands with soap and water before touching you. ? All visitors wash their hands with soap and water before touching you. Contact  precautions  If you get watery poop while you are in the hospital or a long-term care facility, let your doctor know right away.  When you visit someone in the hospital or a long-term care facility, follow the rules for wearing a gown, gloves, or other protective equipment.  If possible, avoid contact with people who have watery poop.  If you are sick and live with other people, use a separate bathroom, if you can. Clean environment  Clean surfaces that are touched often every day. Use a product that has chlorine bleach in it. The bleach should be 10% solution. Be sure to: ? Read the instructions to find out if the product you are using will work on what you are cleaning. ? Clean toilets, bathtubs, sinks, door knobs, and work surfaces.  If you are in the hospital, make sure that the staff cleans the surfaces in your room each day. Tell someone right away if body fluids have splashed or spilled. Washing clothes and linens  Use laundry soap that has chlorine bleach in it to wash clothes and linens. Be sure to: ? Use powder soap instead of liquid. ? Run your washing machine on the hot setting with nothing but soap in it. Do this once a month. Contact a doctor if:  Your symptoms do not get better or they get worse.  Your symptoms go away and then come back.  You have a fever.  You have new symptoms. Get help right away if:  You have more pain or tenderness in your belly.  Your poop is mostly bloody.  Your poop looks dark black and tarry.  You cannot eat or drink without vomiting.  You have signs of not having enough fluids in your body. These include: ? Dark pee, very little pee, or no pee. ? Cracked lips or dry mouth. ? No tears when you cry. ? Sunken eyes. ? Feeling sleepy. ? Feeling weak or dizzy. Summary  C. diff infection may happen after taking antibiotic medicines.  Symptoms include watery poop, fever, tiredness, loss of appetite, nausea, and belly  problems.  Treatment starts with stopping the antibiotics you were using when the infection began. Certain antibiotics are then used to stop C. diff from growing.  This infection is sometimes treated by placing donor poop into the colon or doing surgery.  Washing hands and cleaning every day can help keep C. diff from spreading. This information is not intended to replace advice given to you by your health care provider. Make sure you discuss any questions you have with your health care provider. Document Revised: 01/17/2019 Document Reviewed: 01/17/2019 Elsevier Patient Education  [place].

## 2020-08-15 ENCOUNTER — Telehealth: Payer: Self-pay | Admitting: Family Medicine

## 2020-08-15 NOTE — Telephone Encounter (Signed)
Patient believes she has a urinary tract infection. Patient would like something sent to pharmacy for her urinary problem.

## 2020-08-18 ENCOUNTER — Other Ambulatory Visit: Payer: Self-pay

## 2020-08-18 ENCOUNTER — Inpatient Hospital Stay: Payer: Medicare Other | Attending: Hematology & Oncology

## 2020-08-18 ENCOUNTER — Inpatient Hospital Stay: Payer: Medicare Other | Admitting: Family

## 2020-08-18 DIAGNOSIS — R300 Dysuria: Secondary | ICD-10-CM

## 2020-08-18 NOTE — Telephone Encounter (Signed)
Pt needs a lab appt. Orders placed

## 2020-08-19 NOTE — Telephone Encounter (Signed)
Called patient.... LVM to call back and schedule appt

## 2020-08-21 ENCOUNTER — Telehealth: Payer: Self-pay | Admitting: Gastroenterology

## 2020-08-21 NOTE — Telephone Encounter (Signed)
Patient called said the c-diff is kicking her butt and is seeking help

## 2020-08-21 NOTE — Telephone Encounter (Signed)
Can you please clarify what she is taking right now and what her main issue is? I am assuming persistent diarrhea? Is she taking Dificid? Would recommend she continue that and go to the lab for CBC and BMET. If labs worsening she may need to be admitted

## 2020-08-22 ENCOUNTER — Other Ambulatory Visit (INDEPENDENT_AMBULATORY_CARE_PROVIDER_SITE_OTHER): Payer: Medicare Other

## 2020-08-22 ENCOUNTER — Other Ambulatory Visit: Payer: Self-pay

## 2020-08-22 ENCOUNTER — Inpatient Hospital Stay (HOSPITAL_COMMUNITY)
Admission: EM | Admit: 2020-08-22 | Discharge: 2020-08-25 | DRG: 683 | Disposition: A | Discharge status: Home / Self Care | Payer: Medicare Other | Attending: Internal Medicine | Admitting: Internal Medicine

## 2020-08-22 ENCOUNTER — Encounter (HOSPITAL_COMMUNITY): Payer: Self-pay

## 2020-08-22 ENCOUNTER — Other Ambulatory Visit: Payer: Medicare Other

## 2020-08-22 DIAGNOSIS — R195 Other fecal abnormalities: Secondary | ICD-10-CM | POA: Insufficient documentation

## 2020-08-22 DIAGNOSIS — D539 Nutritional anemia, unspecified: Secondary | ICD-10-CM | POA: Diagnosis present

## 2020-08-22 DIAGNOSIS — N179 Acute kidney failure, unspecified: Secondary | ICD-10-CM | POA: Diagnosis present

## 2020-08-22 DIAGNOSIS — K529 Noninfective gastroenteritis and colitis, unspecified: Secondary | ICD-10-CM | POA: Diagnosis present

## 2020-08-22 DIAGNOSIS — Z20822 Contact with and (suspected) exposure to covid-19: Secondary | ICD-10-CM | POA: Diagnosis present

## 2020-08-22 DIAGNOSIS — Z885 Allergy status to narcotic agent status: Secondary | ICD-10-CM

## 2020-08-22 DIAGNOSIS — F32A Depression, unspecified: Secondary | ICD-10-CM | POA: Diagnosis present

## 2020-08-22 DIAGNOSIS — Z833 Family history of diabetes mellitus: Secondary | ICD-10-CM

## 2020-08-22 DIAGNOSIS — K219 Gastro-esophageal reflux disease without esophagitis: Secondary | ICD-10-CM | POA: Diagnosis not present

## 2020-08-22 DIAGNOSIS — N189 Chronic kidney disease, unspecified: Secondary | ICD-10-CM

## 2020-08-22 DIAGNOSIS — E8722 Chronic metabolic acidosis: Secondary | ICD-10-CM | POA: Diagnosis present

## 2020-08-22 DIAGNOSIS — E872 Acidosis, unspecified: Secondary | ICD-10-CM | POA: Diagnosis present

## 2020-08-22 DIAGNOSIS — M109 Gout, unspecified: Secondary | ICD-10-CM

## 2020-08-22 DIAGNOSIS — Z7984 Long term (current) use of oral hypoglycemic drugs: Secondary | ICD-10-CM

## 2020-08-22 DIAGNOSIS — K644 Residual hemorrhoidal skin tags: Secondary | ICD-10-CM | POA: Diagnosis present

## 2020-08-22 DIAGNOSIS — Z87410 Personal history of cervical dysplasia: Secondary | ICD-10-CM

## 2020-08-22 DIAGNOSIS — K5000 Crohn's disease of small intestine without complications: Secondary | ICD-10-CM | POA: Diagnosis not present

## 2020-08-22 DIAGNOSIS — R197 Diarrhea, unspecified: Secondary | ICD-10-CM

## 2020-08-22 DIAGNOSIS — Z9049 Acquired absence of other specified parts of digestive tract: Secondary | ICD-10-CM | POA: Diagnosis not present

## 2020-08-22 DIAGNOSIS — A0472 Enterocolitis due to Clostridium difficile, not specified as recurrent: Secondary | ICD-10-CM | POA: Diagnosis present

## 2020-08-22 DIAGNOSIS — Z6834 Body mass index (BMI) 34.0-34.9, adult: Secondary | ICD-10-CM

## 2020-08-22 DIAGNOSIS — E875 Hyperkalemia: Secondary | ICD-10-CM | POA: Diagnosis present

## 2020-08-22 DIAGNOSIS — Z8249 Family history of ischemic heart disease and other diseases of the circulatory system: Secondary | ICD-10-CM | POA: Diagnosis not present

## 2020-08-22 DIAGNOSIS — N184 Chronic kidney disease, stage 4 (severe): Secondary | ICD-10-CM | POA: Diagnosis not present

## 2020-08-22 DIAGNOSIS — I129 Hypertensive chronic kidney disease with stage 1 through stage 4 chronic kidney disease, or unspecified chronic kidney disease: Principal | ICD-10-CM | POA: Diagnosis present

## 2020-08-22 DIAGNOSIS — K2100 Gastro-esophageal reflux disease with esophagitis, without bleeding: Secondary | ICD-10-CM | POA: Diagnosis present

## 2020-08-22 DIAGNOSIS — K50019 Crohn's disease of small intestine with unspecified complications: Secondary | ICD-10-CM | POA: Diagnosis not present

## 2020-08-22 DIAGNOSIS — E213 Hyperparathyroidism, unspecified: Secondary | ICD-10-CM | POA: Diagnosis present

## 2020-08-22 DIAGNOSIS — G2581 Restless legs syndrome: Secondary | ICD-10-CM | POA: Diagnosis present

## 2020-08-22 DIAGNOSIS — E785 Hyperlipidemia, unspecified: Secondary | ICD-10-CM | POA: Diagnosis present

## 2020-08-22 DIAGNOSIS — Z79899 Other long term (current) drug therapy: Secondary | ICD-10-CM

## 2020-08-22 DIAGNOSIS — K5080 Crohn's disease of both small and large intestine without complications: Secondary | ICD-10-CM | POA: Diagnosis present

## 2020-08-22 DIAGNOSIS — I10 Essential (primary) hypertension: Secondary | ICD-10-CM

## 2020-08-22 DIAGNOSIS — Z882 Allergy status to sulfonamides status: Secondary | ICD-10-CM

## 2020-08-22 DIAGNOSIS — E876 Hypokalemia: Secondary | ICD-10-CM | POA: Diagnosis present

## 2020-08-22 DIAGNOSIS — E162 Hypoglycemia, unspecified: Secondary | ICD-10-CM | POA: Diagnosis present

## 2020-08-22 DIAGNOSIS — K50918 Crohn's disease, unspecified, with other complication: Secondary | ICD-10-CM | POA: Diagnosis not present

## 2020-08-22 DIAGNOSIS — R300 Dysuria: Secondary | ICD-10-CM

## 2020-08-22 DIAGNOSIS — K91850 Pouchitis: Secondary | ICD-10-CM | POA: Diagnosis present

## 2020-08-22 DIAGNOSIS — N183 Chronic kidney disease, stage 3 unspecified: Secondary | ICD-10-CM | POA: Diagnosis not present

## 2020-08-22 DIAGNOSIS — Z87891 Personal history of nicotine dependence: Secondary | ICD-10-CM

## 2020-08-22 DIAGNOSIS — E860 Dehydration: Secondary | ICD-10-CM | POA: Diagnosis present

## 2020-08-22 LAB — BASIC METABOLIC PANEL
BUN: 77 mg/dL — ABNORMAL HIGH (ref 6–23)
CO2: 14 mEq/L — ABNORMAL LOW (ref 19–32)
Calcium: 9.6 mg/dL (ref 8.4–10.5)
Chloride: 105 mEq/L (ref 96–112)
Creatinine, Ser: 4.85 mg/dL (ref 0.40–1.20)
GFR: 8.82 mL/min — CL (ref >60.00)
Glucose, Bld: 118 mg/dL — ABNORMAL HIGH (ref 70–99)
Potassium: 4 mEq/L (ref 3.5–5.1)
Sodium: 132 mEq/L — ABNORMAL LOW (ref 135–145)

## 2020-08-22 LAB — COMPREHENSIVE METABOLIC PANEL
ALT: 22 U/L (ref 0–44)
AST: 19 U/L (ref 15–41)
Albumin: 4.1 g/dL (ref 3.5–5.0)
Alkaline Phosphatase: 108 U/L (ref 38–126)
Anion gap: 13 (ref 5–15)
BUN: 78 mg/dL — ABNORMAL HIGH (ref 8–23)
CO2: 13 mmol/L — ABNORMAL LOW (ref 22–32)
Calcium: 9.2 mg/dL (ref 8.9–10.3)
Chloride: 106 mmol/L (ref 98–111)
Creatinine, Ser: 4.94 mg/dL — ABNORMAL HIGH (ref 0.44–1.00)
GFR, Estimated: 9 mL/min — ABNORMAL LOW (ref >60)
Glucose, Bld: 96 mg/dL (ref 70–99)
Potassium: 4.1 mmol/L (ref 3.5–5.1)
Sodium: 132 mmol/L — ABNORMAL LOW (ref 135–145)
Total Bilirubin: 0.3 mg/dL (ref 0.3–1.2)
Total Protein: 8.4 g/dL — ABNORMAL HIGH (ref 6.5–8.1)

## 2020-08-22 LAB — BLOOD GAS, VENOUS
Acid-base deficit: 16 mmol/L — ABNORMAL HIGH (ref 0.0–2.0)
Bicarbonate: 12.8 mmol/L — ABNORMAL LOW (ref 20.0–28.0)
FIO2: 21
O2 Saturation: 74.6 %
Patient temperature: 98.6
pCO2, Ven: 42.1 mmHg — ABNORMAL LOW (ref 44.0–60.0)
pH, Ven: 7.109 — CL (ref 7.250–7.430)
pO2, Ven: 47.6 mmHg — ABNORMAL HIGH (ref 32.0–45.0)

## 2020-08-22 LAB — URINALYSIS, ROUTINE W REFLEX MICROSCOPIC
Bacteria, UA: NONE SEEN
Bilirubin Urine: NEGATIVE
Glucose, UA: NEGATIVE mg/dL
Ketones, ur: 5 mg/dL — AB
Nitrite: NEGATIVE
Protein, ur: 100 mg/dL — AB
Specific Gravity, Urine: 1.015 (ref 1.005–1.030)
pH: 5 (ref 5.0–8.0)

## 2020-08-22 LAB — RESP PANEL BY RT-PCR (FLU A&B, COVID) ARPGX2
Influenza A by PCR: NEGATIVE
Influenza B by PCR: NEGATIVE
SARS Coronavirus 2 by RT PCR: NEGATIVE

## 2020-08-22 LAB — CBC
HCT: 37.3 % (ref 36.0–46.0)
HCT: 39.1 % (ref 36.0–46.0)
Hemoglobin: 12.1 g/dL (ref 12.0–15.0)
Hemoglobin: 12.5 g/dL (ref 12.0–15.0)
MCH: 33.2 pg (ref 26.0–34.0)
MCHC: 32.5 g/dL (ref 30.0–36.0)
MCHC: 32 g/dL (ref 30.0–36.0)
MCV: 102.1 fl — ABNORMAL HIGH (ref 78.0–100.0)
MCV: 103.7 fL — ABNORMAL HIGH (ref 80.0–100.0)
Platelets: 458 10*3/uL — ABNORMAL HIGH (ref 150.0–400.0)
Platelets: 407 10*3/uL — ABNORMAL HIGH (ref 150–400)
RBC: 3.66 Mil/uL — ABNORMAL LOW (ref 3.87–5.11)
RBC: 3.77 MIL/uL — ABNORMAL LOW (ref 3.87–5.11)
RDW: 15.1 % (ref 11.5–15.5)
RDW: 15 % (ref 11.5–15.5)
WBC: 15.8 10*3/uL — ABNORMAL HIGH (ref 4.0–10.5)
WBC: 14.5 10*3/uL — ABNORMAL HIGH (ref 4.0–10.5)
nRBC: 0 % (ref 0.0–0.2)

## 2020-08-22 LAB — LACTIC ACID, PLASMA: Lactic Acid, Venous: 1.3 mmol/L (ref 0.5–1.9)

## 2020-08-22 MED ORDER — SODIUM CHLORIDE 0.9 % IV BOLUS
1000.0000 mL | Freq: Once | INTRAVENOUS | Status: AC
Start: 2020-08-22 — End: 2020-08-22
  Administered 2020-08-22: 1000 mL via INTRAVENOUS

## 2020-08-22 MED ORDER — STERILE WATER FOR INJECTION IV SOLN
INTRAVENOUS | Status: DC
  Filled 2020-08-22: qty 150
  Filled 2020-08-22: qty 850
  Filled 2020-08-22 (×2): qty 150
  Filled 2020-08-22: qty 850
  Filled 2020-08-22: qty 150

## 2020-08-22 NOTE — Telephone Encounter (Signed)
I'd like to wait on her labs first. The question is whether C Diff is causing this or if she has a flare of her Crohn's disease. Will await labs today.

## 2020-08-22 NOTE — ED Notes (Signed)
Date and time results received: 08/22/20 22:24 (use smartphrase ".now" to insert current time)  Test: Blood Gas Critical Value: 7.109  Name of Provider Notified: [facility]  Orders Received? Or Actions Taken?:

## 2020-08-22 NOTE — Telephone Encounter (Signed)
Spoke with patient, she states that she is taking Dificid and only has 2 more pills to take. Pt states that she is also taking Imodium, advised to hold Imodium and I would check to see if you would like her to continue, advised that sometimes it can make it worse. Patient states that she is still having persistent diarrhea despite taking the Imodium. Patient will come in today for labs. She has an appt with Ellouise Newer, PA on Wednesday, 08/27/20 at 2 PM. Please advise, thank you.   Lab orders in epic.

## 2020-08-22 NOTE — ED Provider Notes (Signed)
[facility] DEPT Provider Note   CSN: [id number] Arrival date & time: 08/22/20  1820     History Chief Complaint  Patient presents with  . Abnormal Lab    [name] is a 64 y.o. female history of CKD stage III, pretension, hyperlipidemia, GERD, inflammatory bowel disease (s/p colectomy with internal J-pouch), currently being treated with Dificid for C. Difficile (course almost complete).  Patient presents to the emergency department because she had normal lab results earlier today.  Per chart review creatine was elevated to 4.85, baseline appears to be around 2; BUN was elevated to 77, Appears to be in the mid 30s.  She was also found to have an increase in her white count at 15.8.    Patient reports that she was diagnosed with C. difficile on Thanksgiving is being treated with Dificid, reports that she only has 3 pills left.  Reports that she has not seen any improvement or worsening in her diarrhea.  Over this time she has also developed generalized weakness, and urinary incontinence.  Also reports that she has had some tenderness and dizziness morning but denies any syncope or falls.  Patient denies any fevers, chills, shortness of breath, cough, chest pain, abdominal pain, nausea, vomiting, blood in stool.     HPI     Past Medical History:  Diagnosis Date  . Abdominal pain    Hx  . Allergy   . Anal stenosis   . Anemia   . Anxiety   . Arthritis   . Asthma    patient does not have inhaler  . Blood in stool    Hx  . Blood in urine    Hx  . Blood transfusion without reported diagnosis   . Cataract   . CKD (chronic kidney disease) stage 3, GFR 30-59 ml/min (HCC)   . Crohn's colitis, other complication ([place])   . De Quervain's tenosynovitis   . Depression   . Difficulty urinating    Hx  . Easy bruising   . Esophagitis   . Fainting    History - resolved - due to dehydration  . Fatigue    Hx  . Fibroid    Hx  . Gastric polyp   .  GERD (gastroesophageal reflux disease)   . 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 Hemorrhoids, external   . Hemorrhoids, internal   . Herpes, genital    vaginal treated 07/05/12 and pt states is resolved  . History of cervical dysplasia   . History of small bowel obstruction   . Hyperlipidemia    currently no meds  . Hyperparathyroidism   . Hypertension   . Hypokalemia    Hx  . 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 Obesity   . Ovarian cyst   . Poor dental hygiene   . Pulmonary nodule, right    right upper lobe  . Rectal bleeding    Hx  . Rectal pain    Hx  . Renal insufficiency    CKD - stage 3  . RLS (restless legs syndrome)    no meds  . Tooth infection 11/2016   right low  . Ulcerative colitis   . Visual disturbance    wears glasses  . Weakness generalized    Hx - patient denies generalized weakness  . Wears dentures  upper only    Patient Active Problem List   Diagnosis Date Noted  . Acute renal failure superimposed on stage 4 chronic kidney disease ([place]) 08/22/2020  . Hyperglycemia 02/26/2020  . Immunologic deficiency syndrome ([place]) 02/26/2020  . Prothrombin gene mutation ([place]) 02/26/2020  . Acute pain of left shoulder 06/04/2019  . Pes anserine bursitis 04/12/2018  . Pain of right thumb 04/12/2018  . Low back pain 06/21/2017  . Acute right-sided low back pain with right-sided sciatica 06/21/2017  . Idiopathic gout of right ankle 06/21/2017  . Superficial thrombophlebitis of right upper extremity 03/23/2017  . Acute basilic vein thrombosis, right 02/16/2017  . Symptomatic anemia 01/15/2017  . 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 Rectal pain 12/01/2015  . IBD (inflammatory bowel disease) 10/24/2015  . Anal stricture 10/24/2015  . Gout of foot 10/29/2014  . Ovarian cyst, right 06/12/2014  . Acute on chronic renal failure ([place]) 04/02/2013  .  Hydrosalpinx 02/14/2013  . Hyperparathyroidism ([place]) 09/18/2012  . HSV (herpes simplex virus) anogenital infection 06/28/2012  . CKD (chronic kidney disease), stage IV ([place]) 06/17/2012  . LLQ abdominal pain 06/22/2011  . RESTLESS LEGS SYNDROME 08/05/2009  . PULMONARY NODULE, RIGHT UPPER LOBE 02/28/2009  . TOBACCO ABUSE, HX OF 02/11/2009  . PAIN IN JOINT, MULTIPLE SITES 02/04/2009  . Nonspecific (abnormal) findings on radiological and other examination of body structure 12/23/2008  . CT, CHEST, ABNORMAL 12/23/2008  . HEMANGIOMA SIMPLEX 12/16/2008  . Pain in joint, pelvic region and thigh 12/16/2008  . SKIN RASH 12/16/2008  . TINEA CORPORIS 04/18/2008  . LATERAL EPICONDYLITIS, RIGHT 04/18/2008  . Morbid obesity ([place]) 02/05/2008  . ANEMIA 02/05/2008  . ANAL STENOSIS 02/05/2008  . Recurrent Clostridioides difficile diarrhea 02/05/2008  . Personal history of other endocrine, metabolic, and immunity disorders 02/05/2008  . Personal history of other diseases of digestive system 02/05/2008  . SYNCOPE AND COLLAPSE 02/02/2008  . HYPONATREMIA 12/15/2007  . DEHYDRATION 12/15/2007  . HYPOKALEMIA 12/15/2007  . ANEMIA-IRON DEFICIENCY 12/15/2007  . LEUKOCYTOSIS 12/15/2007  . Anxiety state 12/15/2007  . RENAL INSUFFICIENCY 11/16/2007  . TINNITUS NOS 05/25/2007  . DE QUERVAIN'S TENOSYNOVITIS 05/25/2007  . Hyperlipidemia LDL goal <100 10/23/2006  . Depression 10/23/2006  . Benign essential HTN 10/23/2006  . GERD 10/23/2006  . Gastro-esophageal reflux disease without esophagitis 10/23/2006  . Ulcerative colitis without complications ([place]) 27/02/2375  . GASTRIC POLYP 11/16/2001  . ESOPHAGITIS 11/16/2001    Past Surgical History:  Procedure Laterality Date  . ANAL DILATION    . CERVICAL BIOPSY  W/ LOOP ELECTRODE EXCISION    . CHOLECYSTECTOMY    . COLONOSCOPY     [name]  . fatty tumor removed from back     X 2  . HEMORRHOID SURGERY    . ILEOSTOMY CLOSURE    . RESTORATIVE PROCTOCOLECTOMY      with insertion of ileoanal J Pouch with loop ileostomy  . SHOULDER ARTHROSCOPY WITH CAPSULORRHAPHY Left 06/14/2019   Procedure: LEFT SHOULDER ARTHRSCOPIC REPAIR OF BONY BANKART FRACTURE;  Surgeon: Tania Ade, MD;  Location: [facility];  Service: Orthopedics;  Laterality: Left;  REQUEST 90 MINUTE  . SIGMOIDOSCOPY    . TOTAL ABDOMINAL HYSTERECTOMY  1998   TAH/LSO  . TUBAL LIGATION    . UPPER GASTROINTESTINAL ENDOSCOPY     [name]     OB History    Gravida  4   Para  2   Term  2   Preterm      AB  2   Living  2     SAB  2   IAB      Ectopic      Multiple      Live Births  2           Family History  Problem Relation Age of Onset  . Ulcerative colitis Father   . Hypertension Father   . Heart attack Father   . Hypertension Mother   . Heart disease Mother        s/p pci  . Ulcerative colitis Daughter   . Irritable bowel syndrome Other        grandchildren  . Diabetes Sister   . Cancer Sister        uterine  . Cancer Maternal Uncle        LUNG  . Colon cancer Neg Hx   . Esophageal cancer Neg Hx   . Stomach cancer Neg Hx   . Rectal cancer Neg Hx     Social History   Tobacco Use  . Smoking status: Former Smoker    Packs/day: 1.00    Years: 4.00    Pack years: 4.00    Types: Cigarettes    Quit date: 05/21/1979    Years since quitting: 41.2  . Smokeless tobacco: Never Used  Vaping Use  . Vaping Use: Never used  Substance Use Topics  . Alcohol use: No    Alcohol/week: 0.0 standard drinks  . Drug use: No    Home Medications Prior to Admission medications   Medication Sig Start Date End Date Taking? Authorizing Provider  allopurinol (ZYLOPRIM) 300 MG tablet Take 1 tablet (300 mg total) by mouth daily. 03/11/20   Ann Held, DO  AMBULATORY NON FORMULARY MEDICATION Medication Name: Nitroglycerine ointment 0.125 %  Apply a pea sized amount internally four times daily. Dispense 30 GM zero refill 09/21/17   Armbruster, Carlota Raspberry, MD   buPROPion (WELLBUTRIN XL) 150 MG 24 hr tablet Take 150 mg by mouth daily.    [provider]  calcium carbonate (TUMS - DOSED IN MG ELEMENTAL CALCIUM) 500 MG chewable tablet Chew 1 tablet by mouth daily as needed for indigestion or heartburn.    [provider]  DULoxetine (CYMBALTA) 60 MG capsule Take 2 capsules (120 mg total) by mouth daily. 06/05/20   Ann Held, DO  Ergocalciferol (VITAMIN D2) 2000 units TABS Take 2,000 Units by mouth every 30 (thirty) days.     [provider]  famotidine (PEPCID) 20 MG tablet TAKE 1 TABLET BY MOUTH TWICE A DAY 07/01/20   Armbruster, Carlota Raspberry, MD  fidaxomicin (DIFICID) 200 MG TABS tablet Take 1 tablet (200 mg total) by mouth 2 (two) times daily. 08/12/20   Ann Held, DO  folic acid (FOLVITE) 1 MG tablet Take 2 tablets (2 mg total) by mouth daily. 05/26/17   [name], MD  gabapentin (NEURONTIN) 100 MG capsule TAKE 1 CAPSULE BY MOUTH THREE TIMES A DAY Patient taking differently: Take 100 mg by mouth 2 (two) times daily.  05/01/20   Ann Held, DO  hydrALAZINE (APRESOLINE) 25 MG tablet Take 1 tablet by mouth 3 (three) times daily as needed. 06/21/20   [provider]  metFORMIN (GLUCOPHAGE-XR) 500 MG 24 hr tablet Take 500 mg by mouth daily with breakfast.    [provider]  metoprolol succinate (TOPROL-XL) 100 MG 24 hr tablet Take 50 mg by mouth 2 (two) times daily. Take 100 mg in the  a.m. and 50 mg in the evening    [provider]  Probiotic Product (PROBIOTIC PO) Take by mouth.    [provider]  simethicone (GAS-X) 80 MG chewable tablet Chew 80 mg by mouth daily as needed [employer]).     [provider]  vedolizumab (ENTYVIO) 300 MG injection Inject 300 mg into the vein every 30 (thirty) days.    [provider]  Zinc 50 MG TABS Take by mouth.    [provider]    Allergies    Sulfa antibiotics, Morphine, and Sulfonamide  derivatives  Review of Systems   Review of Systems  Constitutional: Negative for chills and fever.  Eyes: Negative for visual disturbance.  Respiratory: Negative for shortness of breath.   Cardiovascular: Negative for chest pain.  Gastrointestinal: Negative for abdominal pain, nausea and vomiting.  Genitourinary: Negative for difficulty urinating, dysuria, hematuria, pelvic pain, vaginal bleeding and vaginal pain.  Musculoskeletal: Negative for back pain and neck pain.  Skin: Negative for color change and rash.  Neurological: Positive for dizziness, weakness (Generalized) and light-headedness. Negative for syncope, numbness and headaches.  Psychiatric/Behavioral: Negative for confusion.    Physical Exam Updated Vital Signs BP 109/66   Pulse 88   Temp 98 F (36.7 C) (Oral)   Resp 19   Ht 5' 3"  (1.6 m)   Wt 88.5 kg   SpO2 100%   BMI 34.54 kg/m   Physical Exam Vitals and nursing note reviewed.  Constitutional:      General: She is not in acute distress.    Appearance: She is obese. She is not ill-appearing, toxic-appearing or diaphoretic.  HENT:     Head: Normocephalic.  Eyes:     Pupils: Pupils are equal, round, and reactive to light.  Cardiovascular:     Rate and Rhythm: Normal rate.  Pulmonary:     Effort: Pulmonary effort is normal.     Breath sounds: Normal breath sounds.  Abdominal:     General: There is no distension.     Palpations: Abdomen is soft. There is no mass or pulsatile mass.     Tenderness: There is no abdominal tenderness. There is no right CVA tenderness, left CVA tenderness, guarding or rebound.  Musculoskeletal:     Cervical back: Neck supple.  Skin:    General: Skin is warm and dry.  Neurological:     General: No focal deficit present.     Mental Status: She is alert.  Psychiatric:        Behavior: Behavior is cooperative.     ED Results / Procedures / Treatments   Labs (all labs ordered are listed, but only abnormal results are  displayed) Labs Reviewed  COMPREHENSIVE METABOLIC PANEL - Abnormal; Notable for the following components:      Result Value   Sodium 132 (*)    CO2 13 (*)    BUN 78 (*)    Creatinine, Ser 4.94 (*)    Total Protein 8.4 (*)    GFR, Estimated 9 (*)    All other components within normal limits  CBC - Abnormal; Notable for the following components:   WBC 14.5 (*)    RBC 3.77 (*)    MCV 103.7 (*)    Platelets 407 (*)    All other components within normal limits  URINALYSIS, ROUTINE W REFLEX MICROSCOPIC - Abnormal; Notable for the following components:   APPearance TURBID (*)    Hgb urine dipstick SMALL (*)    Ketones,  ur 5 (*)    Protein, ur 100 (*)    Leukocytes,Ua LARGE (*)    All other components within normal limits  BLOOD GAS, VENOUS - Abnormal; Notable for the following components:   pH, Ven 7.109 (*)    pCO2, Ven 42.1 (*)    pO2, Ven 47.6 (*)    Bicarbonate 12.8 (*)    Acid-base deficit 16.0 (*)    All other components within normal limits  RESP PANEL BY RT-PCR (FLU A&B, COVID) ARPGX2  CULTURE, BLOOD (ROUTINE X 2)  CULTURE, BLOOD (ROUTINE X 2)  LACTIC ACID, PLASMA  C-REACTIVE PROTEIN  SEDIMENTATION RATE    EKG None  Radiology No results found.  Procedures Procedures (including critical care time)  Medications Ordered in ED Medications  sodium bicarbonate 150 mEq in sterile water 1,000 mL infusion ( Intravenous New Bag/Given 08/22/20 2216)  sodium chloride 0.9 % bolus 1,000 mL (0 mLs Intravenous Stopped 08/22/20 2341)    ED Course  I have reviewed the triage vital signs and the nursing notes.  Pertinent labs & imaging results that were available during my care of the patient were reviewed by me and considered in my medical decision making (see chart for details).    MDM Rules/Calculators/A&P                          Alert 64 year old female in no acute distress, nontoxic appearing.  She was sent to the emergency department for abnormal lab work that  showed creatine was elevated to 4.85, baseline appears to be around 2; BUN was elevated to 77, Appears to be in the mid 30s.  She does have a history of CKD stage III and is followed by Dr. Marval Regal.  She is currently being treated for C. Difficile, reports being diagnosed on Thanksgiving, has not had any improvement in worsening of her diarrhea.  She has had a colectomy done and has an internal J-pouch.  She reports she has had C. difficile infections in the past.  Her gastroenterologist Dr. Havery Moros.  CBC and CMP were repeated, show similar findings to labs obtained earlier this morning.  UA was obtained WBC 21-50, leukocytes large, protein 100, ketones 5, no bacteria seen.    Patient's elevated creatinine and BUN represent an acute kidney injury on top of her chronic kidney disease.  This may be a result of dehydration with patient's chronic diarrhea secondary to her C. difficile infection.  Patient was given IV fluids.  21:40 spoke with hospitalist Dr. [name] agreed to admit the patient.  Final Clinical Impression(s) / ED Diagnoses Final diagnoses:  Acute kidney injury superimposed on chronic kidney disease [facility])    Rx / DC Orders ED Discharge Orders    None       Dyann Ruddle 08/23/20 0004    Charlesetta Shanks, MD 08/23/20 2047

## 2020-08-22 NOTE — ED Triage Notes (Signed)
Patient has elevated BUN, creatinine, and WBC. Patient instructed to come to the ED

## 2020-08-23 ENCOUNTER — Inpatient Hospital Stay (HOSPITAL_COMMUNITY): Payer: Medicare Other

## 2020-08-23 DIAGNOSIS — K529 Noninfective gastroenteritis and colitis, unspecified: Secondary | ICD-10-CM | POA: Diagnosis present

## 2020-08-23 DIAGNOSIS — N184 Chronic kidney disease, stage 4 (severe): Secondary | ICD-10-CM

## 2020-08-23 DIAGNOSIS — M109 Gout, unspecified: Secondary | ICD-10-CM | POA: Diagnosis present

## 2020-08-23 DIAGNOSIS — E876 Hypokalemia: Secondary | ICD-10-CM | POA: Diagnosis present

## 2020-08-23 DIAGNOSIS — I10 Essential (primary) hypertension: Secondary | ICD-10-CM | POA: Diagnosis not present

## 2020-08-23 DIAGNOSIS — R195 Other fecal abnormalities: Secondary | ICD-10-CM | POA: Diagnosis not present

## 2020-08-23 DIAGNOSIS — Z9049 Acquired absence of other specified parts of digestive tract: Secondary | ICD-10-CM | POA: Diagnosis not present

## 2020-08-23 DIAGNOSIS — K91850 Pouchitis: Secondary | ICD-10-CM | POA: Diagnosis present

## 2020-08-23 DIAGNOSIS — K50019 Crohn's disease of small intestine with unspecified complications: Secondary | ICD-10-CM | POA: Diagnosis not present

## 2020-08-23 DIAGNOSIS — E162 Hypoglycemia, unspecified: Secondary | ICD-10-CM | POA: Diagnosis present

## 2020-08-23 DIAGNOSIS — K2100 Gastro-esophageal reflux disease with esophagitis, without bleeding: Secondary | ICD-10-CM

## 2020-08-23 DIAGNOSIS — E213 Hyperparathyroidism, unspecified: Secondary | ICD-10-CM | POA: Diagnosis present

## 2020-08-23 DIAGNOSIS — K644 Residual hemorrhoidal skin tags: Secondary | ICD-10-CM | POA: Diagnosis present

## 2020-08-23 DIAGNOSIS — E860 Dehydration: Secondary | ICD-10-CM | POA: Diagnosis present

## 2020-08-23 DIAGNOSIS — K5080 Crohn's disease of both small and large intestine without complications: Secondary | ICD-10-CM | POA: Diagnosis present

## 2020-08-23 DIAGNOSIS — N179 Acute kidney failure, unspecified: Secondary | ICD-10-CM | POA: Diagnosis present

## 2020-08-23 DIAGNOSIS — Z8249 Family history of ischemic heart disease and other diseases of the circulatory system: Secondary | ICD-10-CM | POA: Diagnosis not present

## 2020-08-23 DIAGNOSIS — E872 Acidosis, unspecified: Secondary | ICD-10-CM | POA: Diagnosis present

## 2020-08-23 DIAGNOSIS — Z6834 Body mass index (BMI) 34.0-34.9, adult: Secondary | ICD-10-CM | POA: Diagnosis not present

## 2020-08-23 DIAGNOSIS — E8722 Chronic metabolic acidosis: Secondary | ICD-10-CM | POA: Diagnosis present

## 2020-08-23 DIAGNOSIS — K5000 Crohn's disease of small intestine without complications: Secondary | ICD-10-CM | POA: Diagnosis present

## 2020-08-23 DIAGNOSIS — E785 Hyperlipidemia, unspecified: Secondary | ICD-10-CM | POA: Diagnosis present

## 2020-08-23 DIAGNOSIS — G2581 Restless legs syndrome: Secondary | ICD-10-CM | POA: Diagnosis present

## 2020-08-23 DIAGNOSIS — Z20822 Contact with and (suspected) exposure to covid-19: Secondary | ICD-10-CM | POA: Diagnosis present

## 2020-08-23 DIAGNOSIS — F32A Depression, unspecified: Secondary | ICD-10-CM | POA: Diagnosis present

## 2020-08-23 DIAGNOSIS — Z833 Family history of diabetes mellitus: Secondary | ICD-10-CM | POA: Diagnosis not present

## 2020-08-23 DIAGNOSIS — I129 Hypertensive chronic kidney disease with stage 1 through stage 4 chronic kidney disease, or unspecified chronic kidney disease: Secondary | ICD-10-CM | POA: Diagnosis present

## 2020-08-23 DIAGNOSIS — D539 Nutritional anemia, unspecified: Secondary | ICD-10-CM | POA: Diagnosis present

## 2020-08-23 DIAGNOSIS — E875 Hyperkalemia: Secondary | ICD-10-CM | POA: Diagnosis present

## 2020-08-23 DIAGNOSIS — A0472 Enterocolitis due to Clostridium difficile, not specified as recurrent: Secondary | ICD-10-CM | POA: Diagnosis present

## 2020-08-23 LAB — HIV ANTIBODY (ROUTINE TESTING W REFLEX): HIV Screen 4th Generation wRfx: NONREACTIVE

## 2020-08-23 LAB — COMPREHENSIVE METABOLIC PANEL
ALT: 17 U/L (ref 0–44)
AST: 16 U/L (ref 15–41)
Albumin: 3.4 g/dL — ABNORMAL LOW (ref 3.5–5.0)
Alkaline Phosphatase: 91 U/L (ref 38–126)
Anion gap: 12 (ref 5–15)
BUN: 76 mg/dL — ABNORMAL HIGH (ref 8–23)
CO2: 14 mmol/L — ABNORMAL LOW (ref 22–32)
Calcium: 8.5 mg/dL — ABNORMAL LOW (ref 8.9–10.3)
Chloride: 108 mmol/L (ref 98–111)
Creatinine, Ser: 4.39 mg/dL — ABNORMAL HIGH (ref 0.44–1.00)
GFR, Estimated: 11 mL/min — ABNORMAL LOW (ref >60)
Glucose, Bld: 94 mg/dL (ref 70–99)
Potassium: 3.3 mmol/L — ABNORMAL LOW (ref 3.5–5.1)
Sodium: 134 mmol/L — ABNORMAL LOW (ref 135–145)
Total Bilirubin: 0.7 mg/dL (ref 0.3–1.2)
Total Protein: 6.9 g/dL (ref 6.5–8.1)

## 2020-08-23 LAB — CBC WITH DIFFERENTIAL/PLATELET
Abs Immature Granulocytes: 0.05 10*3/uL (ref 0.00–0.07)
Basophils Absolute: 0.1 10*3/uL (ref 0.0–0.1)
Basophils Relative: 1 %
Eosinophils Absolute: 0.9 10*3/uL — ABNORMAL HIGH (ref 0.0–0.5)
Eosinophils Relative: 8 %
HCT: 32.3 % — ABNORMAL LOW (ref 36.0–46.0)
Hemoglobin: 10.5 g/dL — ABNORMAL LOW (ref 12.0–15.0)
Immature Granulocytes: 0 %
Lymphocytes Relative: 26 %
Lymphs Abs: 3.1 10*3/uL (ref 0.7–4.0)
MCH: 33.5 pg (ref 26.0–34.0)
MCHC: 32.5 g/dL (ref 30.0–36.0)
MCV: 103.2 fL — ABNORMAL HIGH (ref 80.0–100.0)
Monocytes Absolute: 0.9 10*3/uL (ref 0.1–1.0)
Monocytes Relative: 8 %
Neutro Abs: 6.9 10*3/uL (ref 1.7–7.7)
Neutrophils Relative %: 57 %
Platelets: 327 10*3/uL (ref 150–400)
RBC: 3.13 MIL/uL — ABNORMAL LOW (ref 3.87–5.11)
RDW: 14.8 % (ref 11.5–15.5)
WBC: 11.9 10*3/uL — ABNORMAL HIGH (ref 4.0–10.5)
nRBC: 0 % (ref 0.0–0.2)

## 2020-08-23 LAB — GLUCOSE, CAPILLARY
Glucose-Capillary: 95 mg/dL (ref 70–99)
Glucose-Capillary: 95 mg/dL (ref 70–99)
Glucose-Capillary: 112 mg/dL — ABNORMAL HIGH (ref 70–99)
Glucose-Capillary: 53 mg/dL — ABNORMAL LOW (ref 70–99)
Glucose-Capillary: 158 mg/dL — ABNORMAL HIGH (ref 70–99)
Glucose-Capillary: 98 mg/dL (ref 70–99)

## 2020-08-23 LAB — C-REACTIVE PROTEIN: CRP: 1.6 mg/dL — ABNORMAL HIGH (ref <1.0)

## 2020-08-23 LAB — HEMOGLOBIN A1C
Hgb A1c MFr Bld: 5.6 % (ref 4.8–5.6)
Mean Plasma Glucose: 114.02 mg/dL

## 2020-08-23 LAB — MAGNESIUM: Magnesium: 1.7 mg/dL (ref 1.7–2.4)

## 2020-08-23 LAB — SEDIMENTATION RATE: Sed Rate: 60 mm/hr — ABNORMAL HIGH (ref 0–22)

## 2020-08-23 LAB — C DIFFICILE QUICK SCREEN W PCR REFLEX
C Diff antigen: NEGATIVE
C Diff interpretation: NOT DETECTED
C Diff toxin: NEGATIVE

## 2020-08-23 IMAGING — US US RENAL
1 series · 14 of 25 positions shown · non-contrast
Comparison: MR [DATE], ultrasound [DATE], CT [DATE]

CLINICAL DATA: Acute renal failure

EXAM:
RENAL / URINARY TRACT ULTRASOUND COMPLETE

[Series 1: us renal · 14 of 31 slices shown]
[im 1/31]
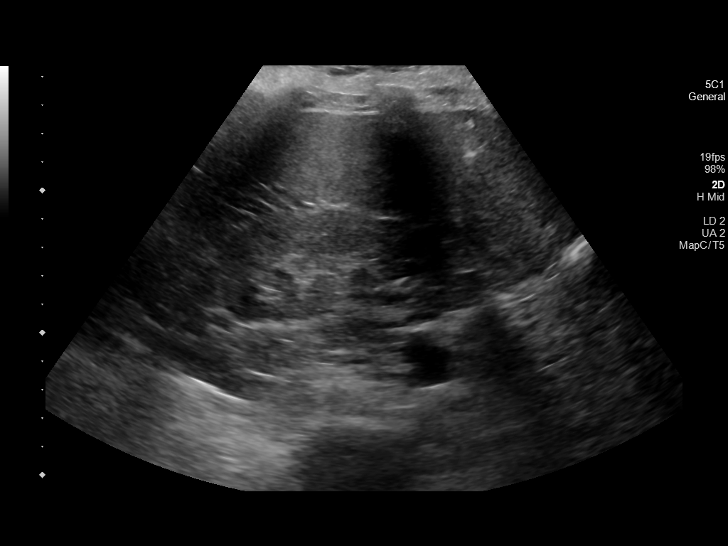
[im 3/31]
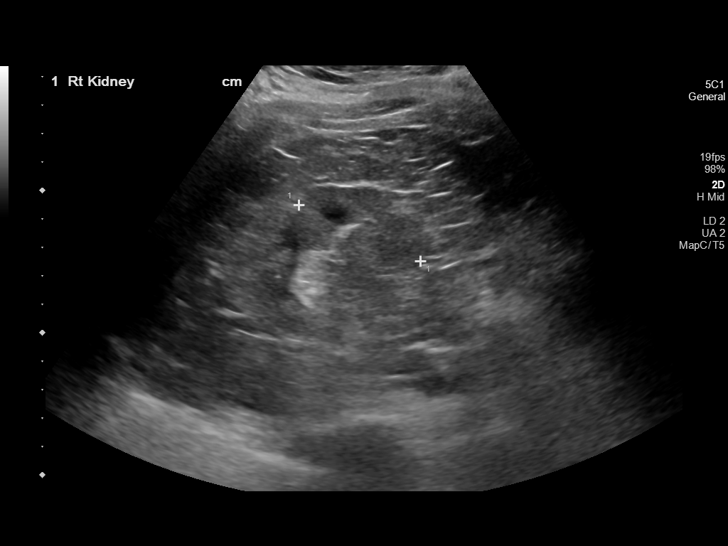
[im 6/31]
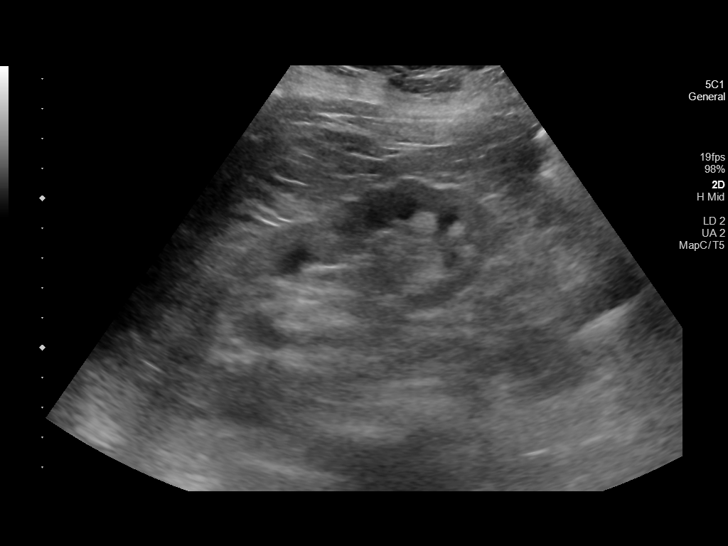
[im 8/31]
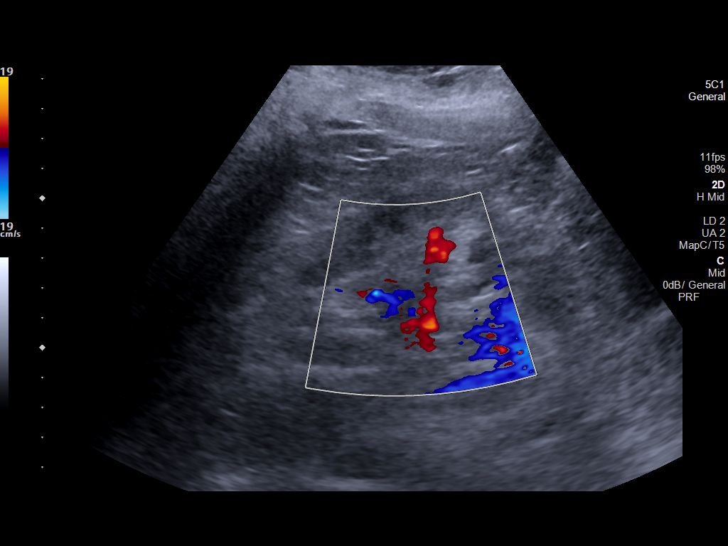
[im 11/31]
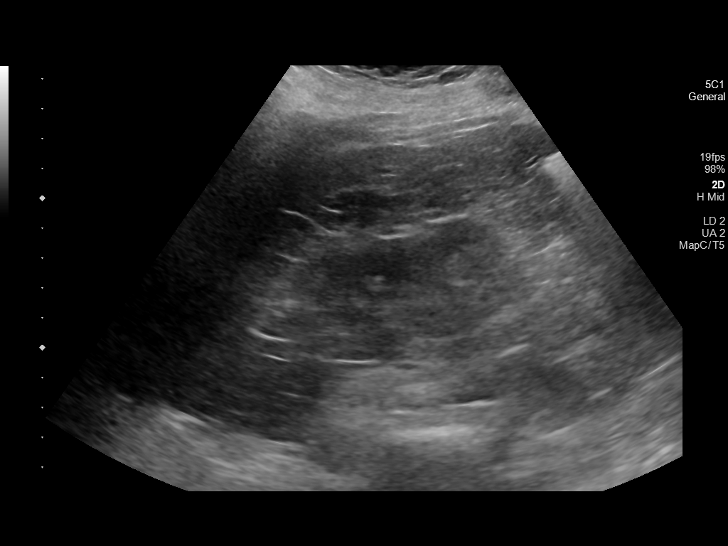
[im 12/31]
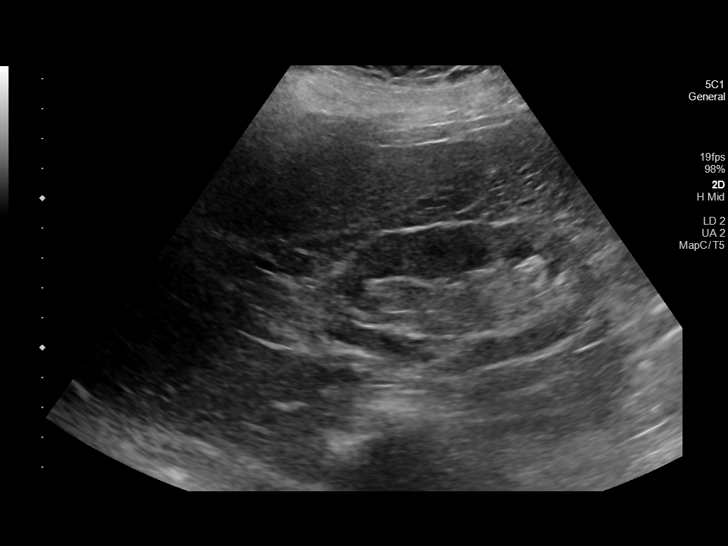
[im 14/31]
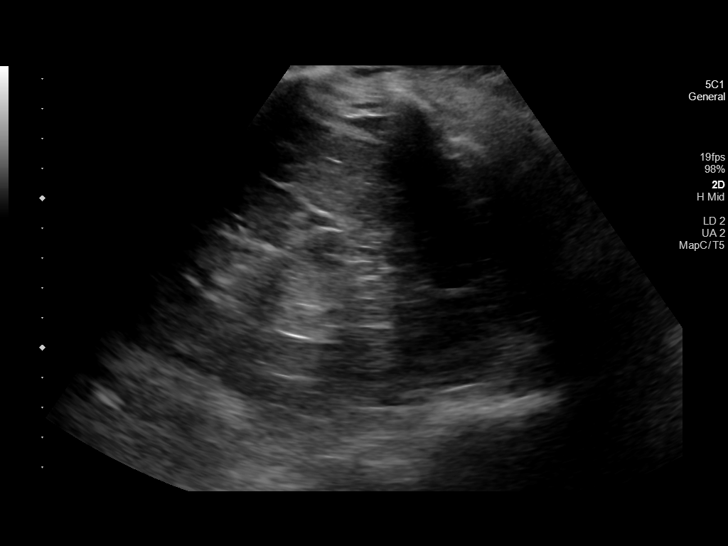
[im 17/31]
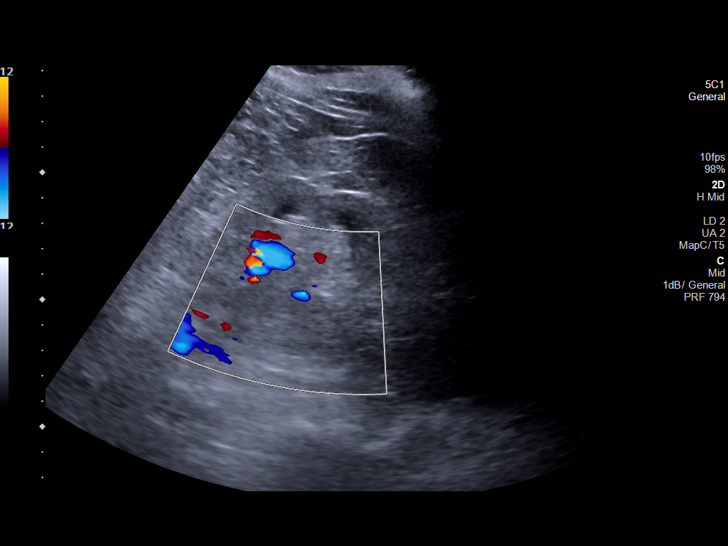
[im 19/31]
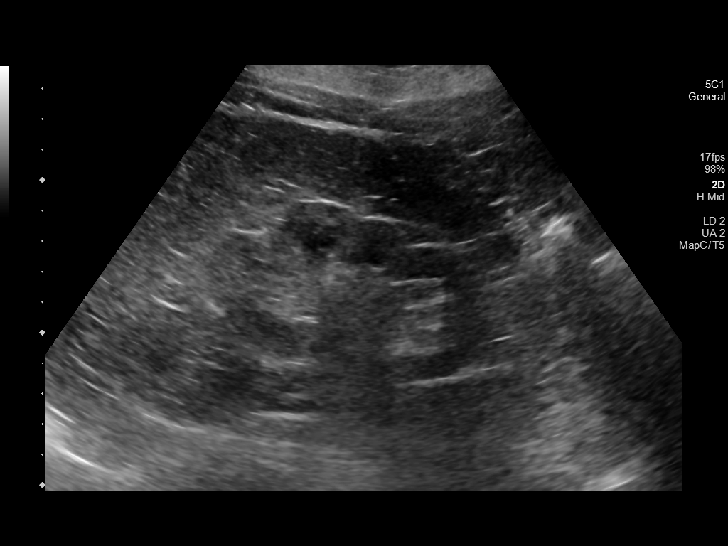
[im 21/31]
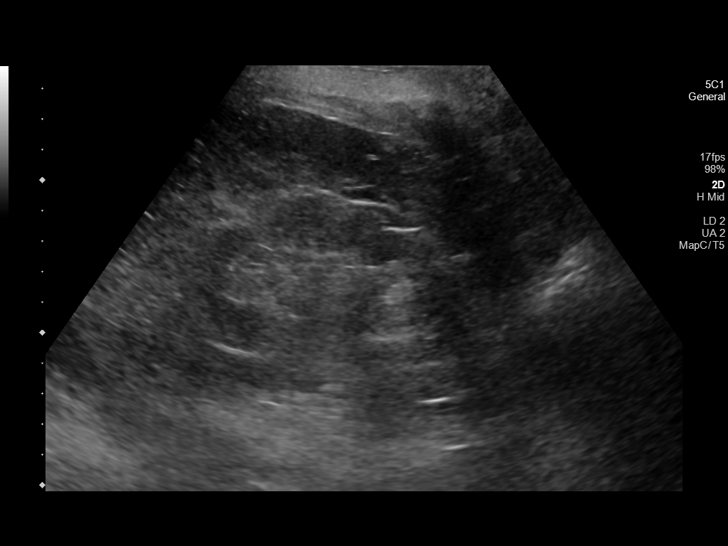
[im 23/31]
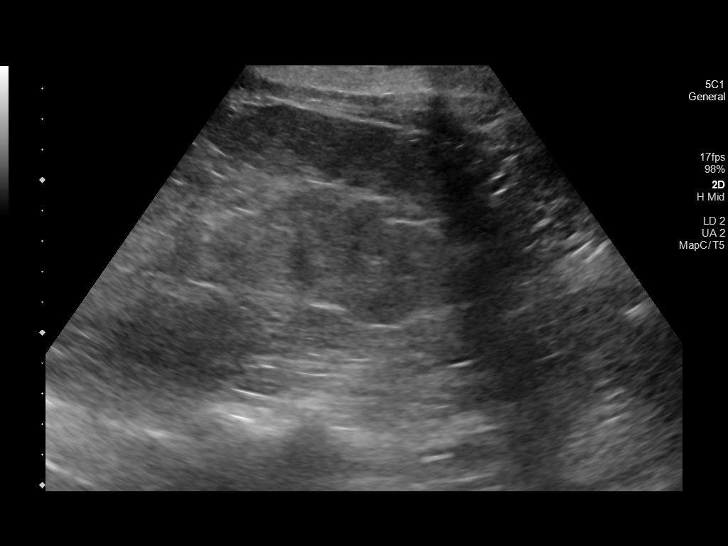
[im 26/31]
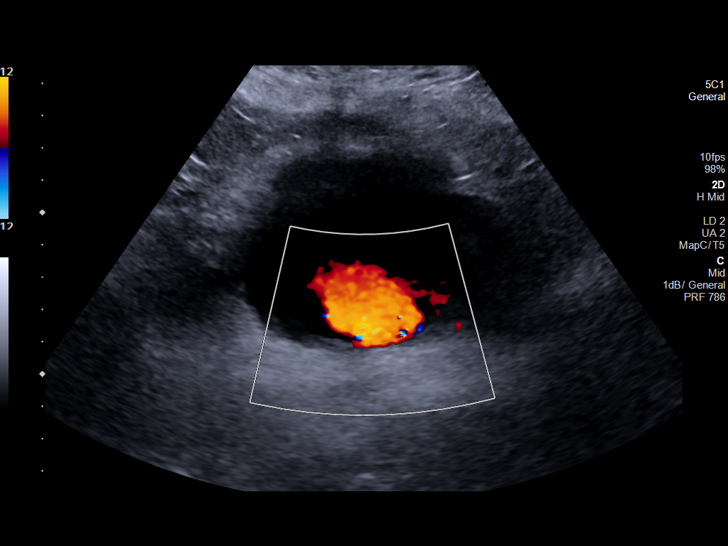
[im 28/31]
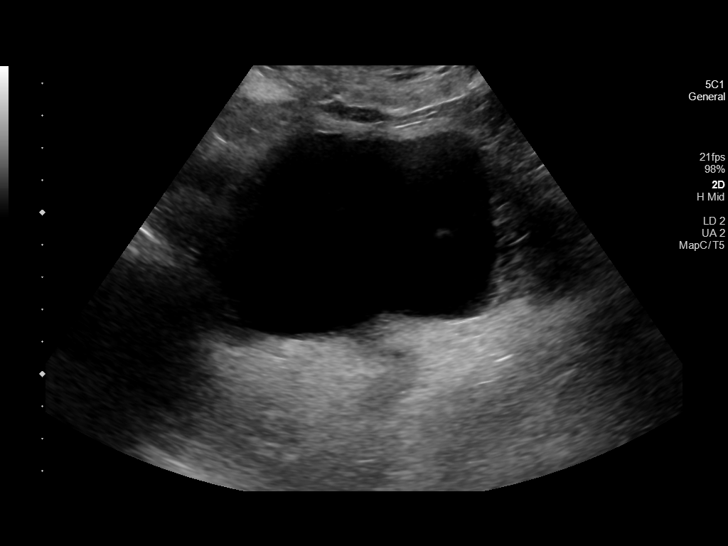
[im 31/31]
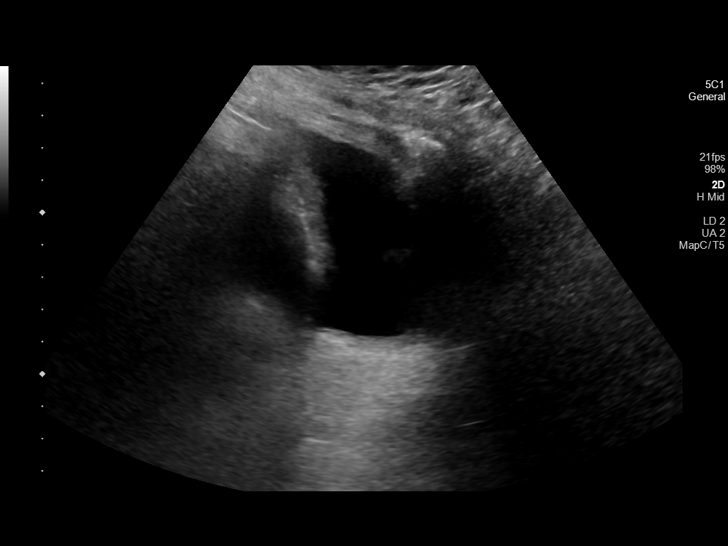

[14 of 25 positions shown; findings below may reference images not displayed]

FINDINGS: Right Kidney:

Renal measurements: 9.8 x 4.5 x 4.7 cm = volume: 107.8 mL. Diffusely
increased renal cortical echogenicity. No concerning renal mass,
shadowing calculus or hydronephrosis.

Left Kidney:

Renal measurements: 9.4 x 5.2 x 5.3 cm = volume: 135.2 mL. Diffusely
increased renal cortical echogenicity. No concerning renal mass,
shadowing calculus or hydronephrosis.

Bladder:

Normal appearance for the degree of distention. Bilateral bladder
jets are identified.

Other:

None.
IMPRESSION: 1. Diffusely increased renal cortical echogenicity compatible with
medical renal disease.
2. Otherwise unremarkable urinary tract ultrasound.

## 2020-08-23 MED ORDER — DULOXETINE HCL 60 MG PO CPEP
120.0000 mg | ORAL_CAPSULE | Freq: Every day | ORAL | Status: DC
  Administered 2020-08-23 – 2020-08-25 (×3): 120 mg via ORAL
  Filled 2020-08-23 (×3): qty 2

## 2020-08-23 MED ORDER — FAMOTIDINE 20 MG PO TABS
20.0000 mg | ORAL_TABLET | Freq: Two times a day (BID) | ORAL | Status: DC
  Administered 2020-08-23 (×2): 20 mg via ORAL
  Filled 2020-08-23 (×2): qty 1

## 2020-08-23 MED ORDER — METOPROLOL SUCCINATE ER 50 MG PO TB24
50.0000 mg | ORAL_TABLET | Freq: Two times a day (BID) | ORAL | Status: DC
Start: 2020-08-23 — End: 2020-08-25
  Administered 2020-08-23 – 2020-08-25 (×6): 50 mg via ORAL
  Filled 2020-08-23 (×6): qty 1

## 2020-08-23 MED ORDER — DEXTROSE-NACL 5-0.9 % IV SOLN: INTRAVENOUS | Status: DC

## 2020-08-23 MED ORDER — FOLIC ACID 1 MG PO TABS
2.0000 mg | ORAL_TABLET | Freq: Every day | ORAL | Status: DC
  Administered 2020-08-23 – 2020-08-25 (×3): 2 mg via ORAL
  Filled 2020-08-23 (×4): qty 2

## 2020-08-23 MED ORDER — GABAPENTIN 100 MG PO CAPS
100.0000 mg | ORAL_CAPSULE | Freq: Two times a day (BID) | ORAL | Status: DC
  Administered 2020-08-23 – 2020-08-25 (×6): 100 mg via ORAL
  Filled 2020-08-23 (×6): qty 1

## 2020-08-23 MED ORDER — FIDAXOMICIN 200 MG PO TABS
200.0000 mg | ORAL_TABLET | Freq: Two times a day (BID) | ORAL | Status: DC
  Administered 2020-08-23 – 2020-08-25 (×6): 200 mg via ORAL
  Filled 2020-08-23 (×6): qty 1

## 2020-08-23 MED ORDER — ACETAMINOPHEN 325 MG PO TABS: 650.0000 mg | ORAL_TABLET | Freq: Four times a day (QID) | ORAL | Status: DC | PRN

## 2020-08-23 MED ORDER — BUPROPION HCL ER (XL) 150 MG PO TB24
150.0000 mg | ORAL_TABLET | Freq: Every day | ORAL | Status: DC
  Administered 2020-08-23 – 2020-08-25 (×2): 150 mg via ORAL
  Filled 2020-08-23 (×3): qty 1

## 2020-08-23 MED ORDER — HEPARIN SODIUM (PORCINE) 5000 UNIT/ML IJ SOLN
5000.0000 [IU] | Freq: Three times a day (TID) | INTRAMUSCULAR | Status: DC
  Administered 2020-08-23 – 2020-08-25 (×8): 5000 [IU] via SUBCUTANEOUS
  Filled 2020-08-23 (×7): qty 1

## 2020-08-23 MED ORDER — DEXTROSE 50 % IV SOLN: 25.0000 g | INTRAVENOUS | Status: AC

## 2020-08-23 MED ORDER — DEXTROSE 50 % IV SOLN
INTRAVENOUS | Status: AC
  Administered 2020-08-23: 25 g via INTRAVENOUS
  Filled 2020-08-23: qty 50

## 2020-08-23 MED ORDER — INSULIN ASPART 100 UNIT/ML ~~LOC~~ SOLN: 0.0000 [IU] | Freq: Three times a day (TID) | SUBCUTANEOUS | Status: DC

## 2020-08-23 MED ORDER — FAMOTIDINE 20 MG PO TABS
20.0000 mg | ORAL_TABLET | Freq: Every day | ORAL | Status: DC
  Administered 2020-08-24 – 2020-08-25 (×2): 20 mg via ORAL
  Filled 2020-08-23 (×2): qty 1

## 2020-08-23 MED ORDER — ONDANSETRON HCL 4 MG PO TABS: 4.0000 mg | ORAL_TABLET | Freq: Four times a day (QID) | ORAL | Status: DC | PRN

## 2020-08-23 MED ORDER — ACETAMINOPHEN 650 MG RE SUPP: 650.0000 mg | Freq: Four times a day (QID) | RECTAL | Status: DC | PRN

## 2020-08-23 MED ORDER — ONDANSETRON HCL 4 MG/2ML IJ SOLN: 4.0000 mg | Freq: Four times a day (QID) | INTRAMUSCULAR | Status: DC | PRN

## 2020-08-23 NOTE — Consult Note (Signed)
CROSS COVER [facility] Reason for Consult: Severe diarrhea with weakness and dehydration. Referring Physician: [facility] hospitalist-Dr. Irine Seal  [name] is an 64 y.o. female.  HPI: [name] is a 65 year old white female with multiple medical problems listed below presented to [facility] long emergency room with history of generalized weakness and severe diarrhea that she has had for several weeks now.  She has a history of Crohn's disease diagnosed in the 1990s and is status post colectomy with ileoproctostomy and a J-pouch revision 1998 with a history of chronic pouchitis; she is under the care of Dr. Nicki Guadalajara at [place] and has been treated with Dificid recently. She claims her diarrhea was not getting better she was hospitalized at [facility] from 07/30/2020 to 08/02/2020 for acute kidney kidney injury thought to be from severe dehydration associated with C. difficile colitis. Once the patient's renal function resumed back to normal patient was discharged from [facility] on Vancomycin.  However the diarrhea persisted and she was seen by her PCP on 08/12/2022 1 the patient was switched to Dificid after discussion with Dr. Havery Moros.  Today the patient seems to be doing better and has only had 1 loose BM in the last 24 hours.  She claims she had 15 BMs per the last 2 to 3 days which seemed to have caused significant weakness and lightheadedness over the last few weeks.  In the emergency room patient was found to have acute kidney injury with a creatinine of 9.76 and a metabolic acidosis with a bicarbonate of 13.  Diagnosis    . Allergy  . Anal stenosis  . Anemia  . Anxiety  . Arthritis  . Asthma    . Blood transfusion without reported diagnosis  . Cataract  . CKD (chronic kidney disease) stage 3, GFR 30-59 ml/min (HCC)  . Crohn's colitis, other complication ([place])  . De Quervain's tenosynovitis  . Depression    . Easy bruising  . Esophagitis    . Fibroid  .  Gastric polyp  . GERD (gastroesophageal reflux disease)  . Hearing loss    . Hemorrhoids, external  . Hemorrhoids, internal  . Herpes, genital    . History of cervical dysplasia  . History of small bowel obstruction  . Hyperlipidemia    . Hyperparathyroidism  . Hypertension  . Hypokalemia    . IBD (inflammatory bowel disease)    . Obesity  . Ovarian cyst  . Pulmonary nodule, right    . Rectal bleeding      . RLS (restless legs syndrome)    . Tooth infection    .  Crohn's disease    .    Past Surgical History:  Procedure Laterality Date  . ANAL DILATION    . CERVICAL BIOPSY  W/ LOOP ELECTRODE EXCISION    . CHOLECYSTECTOMY    . COLONOSCOPY     [name]  . fatty tumor removed from back     X 2  . HEMORRHOID SURGERY    . ILEOSTOMY CLOSURE    . RESTORATIVE PROCTOCOLECTOMY     with insertion of ileoanal J Pouch with loop ileostomy  . SHOULDER ARTHROSCOPY WITH CAPSULORRHAPHY Left 06/14/2019   Procedure: LEFT SHOULDER ARTHRSCOPIC REPAIR OF BONY BANKART FRACTURE;  Surgeon: Tania Ade, MD;  Location: [facility] ORS;  Service: Orthopedics;  Laterality: Left;  REQUEST 90 MINUTE  . SIGMOIDOSCOPY    . TOTAL ABDOMINAL HYSTERECTOMY  1998   TAH/LSO  . TUBAL LIGATION    . UPPER  GASTROINTESTINAL ENDOSCOPY     [name]    Family History  Problem Relation Age of Onset  . Ulcerative colitis Father   . Hypertension Father   . Heart attack Father   . Hypertension Mother   . Heart disease Mother        s/p pci  . Ulcerative colitis Daughter   . Irritable bowel syndrome Other        grandchildren  . Diabetes Sister   . Cancer Sister        uterine  . Cancer Maternal Uncle        LUNG  . Colon cancer Neg Hx   . Esophageal cancer Neg Hx   . Stomach cancer Neg Hx   . Rectal cancer Neg Hx     Social History:  reports that she quit smoking about 41 years ago. Her smoking use included cigarettes. She has a 4.00 pack-year smoking history. She has never used smokeless tobacco.  She reports that she does not drink alcohol and does not use drugs.  Allergies:  Allergies  Allergen Reactions  . Sulfa Antibiotics Hives  . Morphine Other (See Comments)    Gives me crazy dreams Gives me crazy dreams  . Sulfonamide Derivatives Itching, Swelling and Rash   Medications: I have reviewed the patient's current medications.  Results for orders placed or performed during the hospital encounter of 08/22/20 (from the past 48 hour(s))  Comprehensive metabolic panel     Status: Abnormal   Collection Time: 08/22/20  8:09 PM  Result Value Ref Range   Sodium 132 (L) 135 - 145 mmol/L   Potassium 4.1 3.5 - 5.1 mmol/L   Chloride 106 98 - 111 mmol/L   CO2 13 (L) 22 - 32 mmol/L   Glucose, Bld 96 70 - 99 mg/dL    Comment: Glucose reference range applies only to samples taken after fasting for at least 8 hours.   BUN 78 (H) 8 - 23 mg/dL   Creatinine, Ser 4.94 (H) 0.44 - 1.00 mg/dL   Calcium 9.2 8.9 - 10.3 mg/dL   Total Protein 8.4 (H) 6.5 - 8.1 g/dL   Albumin 4.1 3.5 - 5.0 g/dL   AST 19 15 - 41 U/L   ALT 22 0 - 44 U/L   Alkaline Phosphatase 108 38 - 126 U/L   Total Bilirubin 0.3 0.3 - 1.2 mg/dL   GFR, Estimated 9 (L) >60 mL/min    Comment: (NOTE) Calculated using the CKD-EPI Creatinine Equation (2021)    Anion gap 13 5 - 15    Comment: Performed at [facility], [place] [address], [place], [place] [address]  CBC     Status: Abnormal   Collection Time: 08/22/20  8:09 PM  Result Value Ref Range   WBC 14.5 (H) 4.0 - 10.5 K/uL   RBC 3.77 (L) 3.87 - 5.11 MIL/uL   Hemoglobin 12.5 12.0 - 15.0 g/dL   HCT 39.1 36.0 - 46.0 %   MCV 103.7 (H) 80.0 - 100.0 fL   MCH 33.2 26.0 - 34.0 pg   MCHC 32.0 30.0 - 36.0 g/dL   RDW 15.0 11.5 - 15.5 %   Platelets 407 (H) 150 - 400 K/uL   nRBC 0.0 0.0 - 0.2 %    Comment: Performed at [facility], [place] [address], [place], [place] [address]  Sedimentation rate     Status: Abnormal   Collection Time: 08/22/20   8:09 PM  Result Value Ref Range  Sed Rate 60 (H) 0 - 22 mm/hr    Comment: Performed at [facility], [place] [address] [place], [place] [address]  Urinalysis, Routine w reflex microscopic     Status: Abnormal   Collection Time: 08/22/20  8:55 PM  Result Value Ref Range   Color, Urine YELLOW YELLOW   APPearance TURBID (A) CLEAR   Specific Gravity, Urine 1.015 1.005 - 1.030   pH 5.0 5.0 - 8.0   Glucose, UA NEGATIVE NEGATIVE mg/dL   Hgb urine dipstick SMALL (A) NEGATIVE   Bilirubin Urine NEGATIVE NEGATIVE   Ketones, ur 5 (A) NEGATIVE mg/dL   Protein, ur 100 (A) NEGATIVE mg/dL   Nitrite NEGATIVE NEGATIVE   Leukocytes,Ua LARGE (A) NEGATIVE   RBC / HPF 0-5 0 - 5 RBC/hpf   WBC, UA 21-50 0 - 5 WBC/hpf   Bacteria, UA NONE SEEN NONE SEEN   WBC Clumps PRESENT     Comment: Performed at [facility], [place] [address], [place], [place] [address]  Resp Panel by RT-PCR (Flu A&B, Covid) Nasopharyngeal Swab     Status: None   Collection Time: 08/22/20  9:24 PM   Specimen: Nasopharyngeal Swab; Nasopharyngeal(NP) swabs in vial transport medium  Result Value Ref Range   SARS Coronavirus 2 by RT PCR NEGATIVE NEGATIVE    Comment: (NOTE) SARS-CoV-2 target nucleic acids are NOT DETECTED.  The SARS-CoV-2 RNA is generally detectable in upper respiratory specimens during the acute phase of infection. The lowest concentration of SARS-CoV-2 viral copies this assay can detect is 138 copies/mL. A negative result does not preclude SARS-Cov-2 infection and should not be used as the sole basis for treatment or other patient management decisions. A negative result may occur with  improper specimen collection/handling, submission of specimen other than nasopharyngeal swab, presence of viral mutation(s) within the areas targeted by this assay, and inadequate number of viral copies(<138 copies/mL). A negative result must be combined with clinical observations, patient history,  and epidemiological information. The expected result is Negative.  Fact Sheet for Patients:  EntrepreneurPulse.com.au  Fact Sheet for Healthcare Providers:  IncredibleEmployment.be  This test is no t yet approved or cleared by the [place] FDA and  has been authorized for detection and/or diagnosis of SARS-CoV-2 by FDA under an Emergency Use Authorization (EUA). This EUA will remain  in effect (meaning this test can be used) for the duration of the COVID-19 declaration under Section 564(b)(1) of the Act, 21 U.S.C.section 360bbb-3(b)(1), unless the authorization is terminated  or revoked sooner.       Influenza A by PCR NEGATIVE NEGATIVE   Influenza B by PCR NEGATIVE NEGATIVE    Comment: (NOTE) The Xpert Xpress SARS-CoV-2/FLU/RSV plus assay is intended as an aid in the diagnosis of influenza from Nasopharyngeal swab specimens and should not be used as a sole basis for treatment. Nasal washings and aspirates are unacceptable for Xpert Xpress SARS-CoV-2/FLU/RSV testing.  Fact Sheet for Patients: EntrepreneurPulse.com.au  Fact Sheet for Healthcare Providers: IncredibleEmployment.be  This test is not yet approved or cleared by the [place] FDA and has been authorized for detection and/or diagnosis of SARS-CoV-2 by FDA under an Emergency Use Authorization (EUA). This EUA will remain in effect (meaning this test can be used) for the duration of the COVID-19 declaration under Section 564(b)(1) of the Act, 21 U.S.C. section 360bbb-3(b)(1), unless the authorization is terminated or revoked.  Performed at [facility], [place] [address] [place] [address], [place], [place] [address]   Lactic  acid, plasma     Status: None   Collection Time: 08/22/20  9:47 PM  Result Value Ref Range   Lactic Acid, Venous 1.3 0.5 - 1.9 mmol/L    Comment: Performed at [facility], [place] [address],  [place], [place] [address]  Blood gas, venous     Status: Abnormal   Collection Time: 08/22/20 10:00 PM  Result Value Ref Range   FIO2 21.00    pH, Ven 7.109 (LL) 7.250 - 7.430    Comment: CRITICAL RESULT CALLED TO, READ BACK BY AND VERIFIED WITH: GIA BONIS RN 08/22/2020 @2223  BY P.HENDERSON    pCO2, Ven 42.1 (L) 44.0 - 60.0 mmHg   pO2, Ven 47.6 (H) 32.0 - 45.0 mmHg   Bicarbonate 12.8 (L) 20.0 - 28.0 mmol/L   Acid-base deficit 16.0 (H) 0.0 - 2.0 mmol/L   O2 Saturation 74.6 %   Patient temperature 98.6     Comment: Performed at [facility], [place] [address] [place], [place] [address]  Glucose, capillary     Status: None   Collection Time: 08/23/20  1:41 AM  Result Value Ref Range   Glucose-Capillary 95 70 - 99 mg/dL    Comment: Glucose reference range applies only to samples taken after fasting for at least 8 hours.  C Difficile Quick Screen w PCR reflex     Status: None   Collection Time: 08/23/20  2:00 AM   Specimen: STOOL  Result Value Ref Range   C Diff antigen NEGATIVE NEGATIVE   C Diff toxin NEGATIVE NEGATIVE   C Diff interpretation No C. difficile detected.     Comment: No C. difficile detected. Performed at [facility], [place] [address] [place], [place] [address]   C-reactive protein     Status: Abnormal   Collection Time: 08/23/20  7:27 AM  Result Value Ref Range   CRP 1.6 (H) <1.0 mg/dL    Comment: Performed at [facility], [place] [address], [place], [place] [address]  Hemoglobin A1c     Status: None   Collection Time: 08/23/20  7:27 AM  Result Value Ref Range   Hgb A1c MFr Bld 5.6 4.8 - 5.6 %    Comment: (NOTE) Pre diabetes:          5.7%-6.4%  Diabetes:              >6.4%  Glycemic control for   <7.0% adults with diabetes    Mean Plasma Glucose 114.02 mg/dL    Comment: Performed at [place] [address] [place], [place] [address]  HIV Antibody (routine testing w rflx)     Status: None    Collection Time: 08/23/20  7:27 AM  Result Value Ref Range   HIV Screen 4th Generation wRfx Non Reactive Non Reactive    Comment: Performed at [facility], [place] [address], [place], [place] [address]  Comprehensive metabolic panel     Status: Abnormal   Collection Time: 08/23/20  7:27 AM  Result Value Ref Range   Sodium 134 (L) 135 - 145 mmol/L   Potassium 3.3 (L) 3.5 - 5.1 mmol/L    Comment: DELTA CHECK NOTED   Chloride 108 98 - 111 mmol/L   CO2 14 (L) 22 - 32 mmol/L   Glucose, Bld 94 70 - 99 mg/dL    Comment: Glucose reference range applies only to samples taken after fasting for at least 8 hours.   BUN 76 (  H) 8 - 23 mg/dL   Creatinine, Ser 4.39 (H) 0.44 - 1.00 mg/dL   Calcium 8.5 (L) 8.9 - 10.3 mg/dL   Total Protein 6.9 6.5 - 8.1 g/dL   Albumin 3.4 (L) 3.5 - 5.0 g/dL   AST 16 15 - 41 U/L   ALT 17 0 - 44 U/L   Alkaline Phosphatase 91 38 - 126 U/L   Total Bilirubin 0.7 0.3 - 1.2 mg/dL   GFR, Estimated 11 (L) >60 mL/min    Comment: (NOTE) Calculated using the CKD-EPI Creatinine Equation (2021)    Anion gap 12 5 - 15    Comment: Performed at [facility], [place] [address], [place], [place] [address]  Magnesium     Status: None   Collection Time: 08/23/20  7:27 AM  Result Value Ref Range   Magnesium 1.7 1.7 - 2.4 mg/dL    Comment: Performed at [facility], [place] [address] [place], [place] [address]  CBC WITH DIFFERENTIAL     Status: Abnormal   Collection Time: 08/23/20  7:27 AM  Result Value Ref Range   WBC 11.9 (H) 4.0 - 10.5 K/uL   RBC 3.13 (L) 3.87 - 5.11 MIL/uL   Hemoglobin 10.5 (L) 12.0 - 15.0 g/dL   HCT 32.3 (L) 36.0 - 46.0 %   MCV 103.2 (H) 80.0 - 100.0 fL   MCH 33.5 26.0 - 34.0 pg   MCHC 32.5 30.0 - 36.0 g/dL   RDW 14.8 11.5 - 15.5 %   Platelets 327 150 - 400 K/uL   nRBC 0.0 0.0 - 0.2 %   Neutrophils Relative % 57 %   Neutro Abs 6.9 1.7 - 7.7 K/uL   Lymphocytes Relative 26 %   Lymphs Abs 3.1 0.7 - 4.0 K/uL   Monocytes  Relative 8 %   Monocytes Absolute 0.9 0.1 - 1.0 K/uL   Eosinophils Relative 8 %   Eosinophils Absolute 0.9 (H) 0.0 - 0.5 K/uL   Basophils Relative 1 %   Basophils Absolute 0.1 0.0 - 0.1 K/uL   Immature Granulocytes 0 %   Abs Immature Granulocytes 0.05 0.00 - 0.07 K/uL   Reactive, Benign Lymphocytes PRESENT     Comment: Performed at [facility], [place] [address], [place], [place] [address]  Glucose, capillary     Status: None   Collection Time: 08/23/20  7:48 AM  Result Value Ref Range   Glucose-Capillary 95 70 - 99 mg/dL    Comment: Glucose reference range applies only to samples taken after fasting for at least 8 hours.  Glucose, capillary     Status: Abnormal   Collection Time: 08/23/20 12:03 PM  Result Value Ref Range   Glucose-Capillary 112 (H) 70 - 99 mg/dL    Comment: Glucose reference range applies only to samples taken after fasting for at least 8 hours.   *Note: Due to a large number of results and/or encounters for the requested time period, some results have not been displayed. A complete set of results can be found in Results Review.    No results found.  Review of Systems  Constitutional: Positive for activity change and fatigue. Negative for appetite change, chills, diaphoresis, fever and unexpected weight change.  HENT: Negative.   Eyes: Negative.   Respiratory: Negative.   Gastrointestinal: Positive for diarrhea. Negative for abdominal distention, abdominal pain, anal bleeding, blood in stool, nausea, rectal pain and vomiting.  Endocrine: Negative.   Genitourinary: Negative.   Musculoskeletal: Positive for  arthralgias.  Allergic/Immunologic: Negative.   Neurological: Positive for dizziness and weakness. Negative for tremors, seizures, syncope, speech difficulty, light-headedness, numbness and headaches.   Blood pressure 122/74, pulse 88, temperature 98 F (36.7 C), temperature source Oral, resp. rate 16, height 5\' 3"  (1.6 m), weight 88.5 kg,  SpO2 100 %. Physical Exam Constitutional:      Appearance: She is obese.  HENT:     Head: Normocephalic and atraumatic.     Nose: Nose normal.     Mouth/Throat:     Mouth: Mucous membranes are moist.  Eyes:     Extraocular Movements: Extraocular movements intact.     Pupils: Pupils are equal, round, and reactive to light.  Cardiovascular:     Rate and Rhythm: Normal rate and regular rhythm.     Pulses: Normal pulses.     Heart sounds: Normal heart sounds.  Abdominal:     General: Bowel sounds are normal. There is no distension.     Palpations: Abdomen is soft.     Tenderness: There is no abdominal tenderness. There is no guarding.  Musculoskeletal:     Cervical back: Normal range of motion and neck supple.  Skin:    General: Skin is warm and dry.  Neurological:     General: No focal deficit present.     Mental Status: She is alert and oriented to person, place, and time.  Psychiatric:        Mood and Affect: Mood normal.        Behavior: Behavior normal.        Thought Content: Thought content normal.        Judgment: Judgment normal.   Assessment/Plan: 1) Severe diarrhea and dehydration-recent C. difficile infection treated with Dificid-last 2 doses left to take.  Significantly improved today with only 1 BM in the last 24 hours.  We will continue supportive treatment for now and follow her symptoms closely. 2) History of Crohn's disease status post colectomy with ileoproctostomy and J-pouch revision in 1998 with chronic pouchitis. 3) Gastroesophageal reflux disease. 4) Chronic kidney disease stage IV. 5) Restless leg syndrome.  Juanita Craver 08/23/2020, 12:58 PM

## 2020-08-23 NOTE — H&P (Addendum)
History and Physical    [name] YWV:[id number] DOB: 04-07-1956 DOA: 08/22/2020  PCP: Ann Held, DO  Patient coming from: Home.   Chief Complaint:  Chief Complaint  Patient presents with  . Abnormal Lab     HPI:    64 year old female with past medical history of Crohn's disease (Dx 1990) status post colectomy with ileoproctostomy and J-pouch revision in 1998 now with subsequent chronic pouchitis, chronic kidney disease stage IV, restless leg syndrome, gout, gastroesophageal reflux disease who presents to [facility] emergency department due to progressively worsening weakness.  Of note, patient was recently hospitalized at [facility] from 11/24 until 11/27 for acute kidney injury.  It was suspected to be suffering from volume depletion secondary to C. difficile colitis.  After renal function returned to baseline patient was eventually discharged home on oral vancomycin.  After discharge, patient continued to experience diarrhea and followed up with her primary care provider on 12/7.  That time, primary care provider documents that case was discussed with Dr. Havery Moros with gastroenterology and it was recommended the patient be switched to [place].  Patient states that despite this medication change she has continued to experience frequent bouts of watery diarrhea.  Patient denies any significant associated abdominal pain.  Patient denies fevers or sick contacts.  As patient's symptoms continue to persist over the next several weeks, patient began to develop progressively worsening generalized weakness.  Weakness is severe in intensity worse with exertion and improved with rest.  Weakness is associated with lightheadedness.  Patient eventually presented to [facility] emergency department for evaluation.  Upon evaluation in the emergency department patient has been found to be suffering from acute kidney injury with creatinine of 4.94 as well as  substantial metabolic acidosis with bicarbonate on initial chemistry of 13.  Patient was initiated on intravenous hydration and the hospitalist group was then called to assist patient for admission the hospital.  Review of Systems:   Review of Systems  Constitutional: Positive for malaise/fatigue.  Gastrointestinal: Positive for diarrhea.  Neurological: Positive for weakness.    Past Medical History:  Diagnosis Date  . Abdominal pain    Hx  . Allergy   . Anal stenosis   . Anemia   . Anxiety   . Arthritis   . Asthma    patient does not have inhaler  . Blood in stool    Hx  . Blood in urine    Hx  . Blood transfusion without reported diagnosis   . Cataract   . CKD (chronic kidney disease) stage 3, GFR 30-59 ml/min (HCC)   . Crohn's colitis, other complication ([place])   . De Quervain's tenosynovitis   . Depression   . Difficulty urinating    Hx  . Easy bruising   . Esophagitis   . Fainting    History - resolved - due to dehydration  . Fatigue    Hx  . Fibroid    Hx  . Gastric polyp   . GERD (gastroesophageal reflux disease)   . 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 Hemorrhoids, external   . Hemorrhoids, internal   . Herpes, genital    vaginal treated 07/05/12 and pt states is resolved  . History of cervical dysplasia   . History of small bowel obstruction   . Hyperlipidemia    currently no meds  . Hyperparathyroidism   . Hypertension   . Hypokalemia  Hx  . 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 Obesity   . Ovarian cyst   . Poor dental hygiene   . Pulmonary nodule, right    right upper lobe  . Rectal bleeding    Hx  . Rectal pain    Hx  . Renal insufficiency    CKD - stage 3  . RLS (restless legs syndrome)    no meds  . Tooth infection 11/2016   right low  . Ulcerative colitis   . Visual disturbance    wears glasses  . Weakness  generalized    Hx - patient denies generalized weakness  . Wears dentures    upper only    Past Surgical History:  Procedure Laterality Date  . ANAL DILATION    . CERVICAL BIOPSY  W/ LOOP ELECTRODE EXCISION    . CHOLECYSTECTOMY    . COLONOSCOPY     [name]  . fatty tumor removed from back     X 2  . HEMORRHOID SURGERY    . ILEOSTOMY CLOSURE    . RESTORATIVE PROCTOCOLECTOMY     with insertion of ileoanal J Pouch with loop ileostomy  . SHOULDER ARTHROSCOPY WITH CAPSULORRHAPHY Left 06/14/2019   Procedure: LEFT SHOULDER ARTHRSCOPIC REPAIR OF BONY BANKART FRACTURE;  Surgeon: Tania Ade, MD;  Location: [facility] ORS;  Service: Orthopedics;  Laterality: Left;  REQUEST 90 MINUTE  . SIGMOIDOSCOPY    . TOTAL ABDOMINAL HYSTERECTOMY  1998   TAH/LSO  . TUBAL LIGATION    . UPPER GASTROINTESTINAL ENDOSCOPY     Olevia Perches     reports that she quit smoking about 41 years ago. Her smoking use included cigarettes. She has a 4.00 pack-year smoking history. She has never used smokeless tobacco. She reports that she does not drink alcohol and does not use drugs.  Allergies  Allergen Reactions  . Sulfa Antibiotics Hives  . Morphine Other (See Comments)    Gives me crazy dreams Gives me crazy dreams  . Sulfonamide Derivatives Itching, Swelling and Rash    Family History  Problem Relation Age of Onset  . Ulcerative colitis Father   . Hypertension Father   . Heart attack Father   . Hypertension Mother   . Heart disease Mother        s/p pci  . Ulcerative colitis Daughter   . Irritable bowel syndrome Other        grandchildren  . Diabetes Sister   . Cancer Sister        uterine  . Cancer Maternal Uncle        LUNG  . Colon cancer Neg Hx   . Esophageal cancer Neg Hx   . Stomach cancer Neg Hx   . Rectal cancer Neg Hx      Prior to Admission medications   Medication Sig Start Date End Date Taking? Authorizing Provider  allopurinol (ZYLOPRIM) 300 MG tablet Take 1 tablet (300 mg total) by  mouth daily. 03/11/20   Ann Held, DO  AMBULATORY NON FORMULARY MEDICATION Medication Name: Nitroglycerine ointment 0.125 %  Apply a pea sized amount internally four times daily. Dispense 30 GM zero refill 09/21/17   Armbruster, Carlota Raspberry, MD  buPROPion (WELLBUTRIN XL) 150 MG 24 hr tablet Take 150 mg by mouth daily.    [provider]  calcium carbonate (TUMS - DOSED IN MG ELEMENTAL CALCIUM) 500 MG chewable tablet  Chew 1 tablet by mouth daily as needed for indigestion or heartburn.    [provider]  DULoxetine (CYMBALTA) 60 MG capsule Take 2 capsules (120 mg total) by mouth daily. 06/05/20   Ann Held, DO  Ergocalciferol (VITAMIN D2) 2000 units TABS Take 2,000 Units by mouth every 30 (thirty) days.     [provider]  famotidine (PEPCID) 20 MG tablet TAKE 1 TABLET BY MOUTH TWICE A DAY 07/01/20   Armbruster, Carlota Raspberry, MD  fidaxomicin (DIFICID) 200 MG TABS tablet Take 1 tablet (200 mg total) by mouth 2 (two) times daily. 08/12/20   Ann Held, DO  folic acid (FOLVITE) 1 MG tablet Take 2 tablets (2 mg total) by mouth daily. 05/26/17   [name], MD  gabapentin (NEURONTIN) 100 MG capsule TAKE 1 CAPSULE BY MOUTH THREE TIMES A DAY Patient taking differently: Take 100 mg by mouth 2 (two) times daily.  05/01/20   Ann Held, DO  hydrALAZINE (APRESOLINE) 25 MG tablet Take 1 tablet by mouth 3 (three) times daily as needed. 06/21/20   [provider]  metFORMIN (GLUCOPHAGE-XR) 500 MG 24 hr tablet Take 500 mg by mouth daily with breakfast.    [provider]  metoprolol succinate (TOPROL-XL) 100 MG 24 hr tablet Take 50 mg by mouth 2 (two) times daily. Take 100 mg in the a.m. and 50 mg in the evening    [provider]  Probiotic Product (PROBIOTIC PO) Take by mouth.    [provider]  simethicone (GAS-X) 80 MG chewable tablet Chew 80 mg by mouth daily as needed [employer]).     [provider]   vedolizumab (ENTYVIO) 300 MG injection Inject 300 mg into the vein every 30 (thirty) days.    [provider]  Zinc 50 MG TABS Take by mouth.    [provider]    Physical Exam: Vitals:   08/22/20 2109 08/22/20 2130 08/22/20 2200 08/23/20 0006  BP: 108/69 107/69 109/66 117/60  Pulse: 86 88  98  Resp: 20 18 19 17   Temp:      TempSrc:      SpO2: 100% 100%  100%  Weight:      Height:        Constitutional: Lethargic arousable and oriented x3, no associated distress.   Skin: no rashes, no lesions, notable for skin turgor  Eyes: Pupils are equally reactive to light.  No evidence of scleral icterus or conjunctival pallor.  ENMT: Dry mucous membranes noted.  Posterior pharynx clear of any exudate or lesions.   Neck: normal, supple, no masses, no thyromegaly.  No evidence of jugular venous distension.   Respiratory: clear to auscultation bilaterally, no wheezing, no crackles. Normal respiratory effort. No accessory muscle use.  Cardiovascular: Regular rate and rhythm, no murmurs / rubs / gallops. No extremity edema. 2+ pedal pulses. No carotid bruits.  Chest:   Nontender without crepitus or deformity.   Back:   Nontender without crepitus or deformity. Abdomen: Abdomen is soft and nontender.  No evidence of intra-abdominal masses.  Hyperactive bowel sounds noted in all quadrants.   Musculoskeletal: No joint deformity upper and lower extremities. Good ROM, no contractures. Normal muscle tone.  Neurologic: CN 2-12 grossly intact. Sensation intact.  Patient moving all 4 extremities spontaneously.  Patient is following all commands.  Patient is responsive to verbal stimuli.   Psychiatric: Patient exhibits normal mood with appropriate affect.  Patient seems to possess insight  as to their current situation.     Labs on Admission: I have personally reviewed following labs and imaging studies -   CBC: Recent Labs  Lab 08/22/20 1452 08/22/20 2009  WBC 15.8* 14.5*  HGB 12.1  12.5  HCT 37.3 39.1  MCV 102.1* 103.7*  PLT 458.0* 026*   Basic Metabolic Panel: Recent Labs  Lab 08/22/20 1452 08/22/20 2009  NA 132* 132*  K 4.0 4.1  CL 105 106  CO2 14* 13*  GLUCOSE 118* 96  BUN 77* 78*  CREATININE 4.85* 4.94*  CALCIUM 9.6 9.2   GFR: Estimated Creatinine Clearance: 11.8 mL/min (A) (by C-G formula based on SCr of 4.94 mg/dL (H)). Liver Function Tests: Recent Labs  Lab 08/22/20 2009  AST 19  ALT 22  ALKPHOS 108  BILITOT 0.3  PROT 8.4*  ALBUMIN 4.1   No results for input(s): LIPASE, AMYLASE in the last 168 hours. No results for input(s): AMMONIA in the last 168 hours. Coagulation Profile: No results for input(s): INR, PROTIME in the last 168 hours. Cardiac Enzymes: No results for input(s): CKTOTAL, CKMB, CKMBINDEX, TROPONINI in the last 168 hours. BNP (last 3 results) No results for input(s): PROBNP in the last 8760 hours. HbA1C: No results for input(s): HGBA1C in the last 72 hours. CBG: No results for input(s): GLUCAP in the last 168 hours. Lipid Profile: No results for input(s): CHOL, HDL, LDLCALC, TRIG, CHOLHDL, LDLDIRECT in the last 72 hours. Thyroid Function Tests: No results for input(s): TSH, T4TOTAL, FREET4, T3FREE, THYROIDAB in the last 72 hours. Anemia Panel: No results for input(s): VITAMINB12, FOLATE, FERRITIN, TIBC, IRON, RETICCTPCT in the last 72 hours. Urine analysis:    Component Value Date/Time   COLORURINE YELLOW 08/22/2020 2055   APPEARANCEUR TURBID (A) 08/22/2020 2055   LABSPEC 1.015 08/22/2020 2055   PHURINE 5.0 08/22/2020 2055   GLUCOSEU NEGATIVE 08/22/2020 2055   GLUCOSEU NEGATIVE 03/14/2009 1603   HGBUR SMALL (A) 08/22/2020 2055   BILIRUBINUR NEGATIVE 08/22/2020 2055   BILIRUBINUR Negative 01/06/2018 1306   KETONESUR 5 (A) 08/22/2020 2055   PROTEINUR 100 (A) 08/22/2020 2055   UROBILINOGEN 1.0 01/06/2018 1306   UROBILINOGEN 0.2 04/12/2013 0900   NITRITE NEGATIVE 08/22/2020 2055   LEUKOCYTESUR LARGE (A)  08/22/2020 2055    Radiological Exams on Admission - Personally Reviewed: No results found.  EKG: Personally reviewed.  Rhythm is normal sinus rhythm with heart rate of 96 bpm.  No dynamic ST segment changes appreciated.  Assessment/Plan Principal Problem:   Acute renal failure superimposed on stage 4 chronic kidney disease ([place])   Patient presenting with progressively worsening generalized weakness, presumably secondary hyperkalemia with secondary prerenal acute kidney injury  Creatinine currently 4.94, up from baseline of 2.21  Patient additionally suffering from substantial metabolic acidosis likely secondary to longstanding renal injury  Hydrating patient with intravenous isotonic fluids including sodium bicarbonate to help correct metabolic acidosis  Strict input and output monitoring  Monitoring renal function electrolytes with serial chemistries  Avoiding nephrotoxic agents if at all possible  If renal function does not rapidly improve consider nephrology consultation  Active Problems:   Stool culture positive for Clostridioides difficile   Patient found to be positive for C. difficile during hospitalization at [facility] 11/24 until 11/27.    Despite this positive finding, I am uncertain as to whether or not patient is actually suffering from C. difficile infection at this point due to patient's lack of response to an extended course of oral vancomycin as well as an  approximate 10-day course of Dificid  It is possible that patient's known Crohn's disease is actually playing a role in patient's presentation  Obtaining inflammatory markers  Continuing previously prescribed Dificid for now  Clear liquid diet  Obtaining stool studies including GI pathogen panel and repeat C. difficile PCR testing  Will ask for gastroenterology consultation in managing this complicated case -their assistance is appreciated.    Crohn's disease of jejunum ([place])   Longstanding known  history of Crohn's disease diagnosed approximately 1990  Patient is status post colectomy 1998 with ileoproctostomy and J-pouch revision  Patient follows with Dr. Havery Moros with [facility] gastroenterology   Patient currently receives outpatient infusions of Entyvio  Patient now suffers from chronic pouchitis  Please see remainder of assessment and plan above    Chronic diarrhea   Patient reports that she has suffered from longstanding chronic diarrhea due to chronic pouchitis and Crohn's disease  This presence of chronic diarrhea makes assessment of response to treatment for C. difficile difficult    Essential hypertension   Continue home regimen of metoprolol    GERD with esophagitis   History of esophagitis  Continue home regimen of famotidine    Gout    Holding home regimen of allopurinol due to renal function   Code Status:  Full code Family Communication: deferred   Status is: Observation  The patient remains OBS appropriate and will d/c before 2 midnights.  Dispo: The patient is from: Home              Anticipated d/c is to: Home              Anticipated d/c date is: 2 days              Patient currently is not medically stable to d/c.        Vernelle Emerald MD [facility] Pager [phone]  If 7PM-7AM, please contact night-coverage [email or web address] Use universal [place] password for that web site. If you do not have the password, please call the hospital operator.  08/23/2020, 12:06 AM

## 2020-08-23 NOTE — Progress Notes (Signed)
PROGRESS NOTE    [name]  UMP:[id number] DOB: 1956-07-25 DOA: 08/22/2020 PCP: Ann Held, DO    Chief Complaint  Patient presents with  . Abnormal Lab    Brief Narrative:  HPI per Dr. 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  Of note, patient was recently hospitalized at [facility] from 11/24 until 11/27 for acute kidney injury.  It was suspected to be suffering from volume depletion secondary to C. difficile colitis.  After renal function returned to baseline patient was eventually discharged home on oral vancomycin.  After discharge, patient continued to experience diarrhea and followed up with her primary care provider on 12/7.  That time, primary care provider documents that case was discussed with Dr. Havery Moros with gastroenterology and it was recommended the patient be switched to [place].  Patient states that despite this medication change she has continued to experience frequent bouts of watery diarrhea.  Patient denies any significant associated abdominal pain.  Patient denies fevers or sick contacts.  As patient's symptoms continue to persist over the next several weeks, patient began to develop progressively worsening generalized weakness.  Weakness is severe in intensity worse with exertion and improved with rest.  Weakness is associated with lightheadedness.  Patient eventually presented to [facility] emergency department for evaluation.  Upon evaluation in the emergency department patient has been found to be suffering from acute kidney injury with creatinine of 4.94 as well as substantial metabolic  acidosis with bicarbonate on initial chemistry of 13.  Patient was initiated on intravenous hydration and the hospitalist group was then called to assist patient for admission the hospital.   Assessment & Plan:   Principal Problem:   Acute renal failure superimposed on stage 4 chronic kidney disease ([place]) Active Problems:   Essential hypertension   GERD with esophagitis   Stool culture positive for Clostridioides difficile   Gout   Crohn's disease of jejunum (HCC)   Chronic diarrhea   Metabolic acidosis  1 acute renal failure on chronic kidney disease stage IV/metabolic acidosis Baseline creatinine approximately 2.21.  Likely secondary to a prerenal azotemia in the setting of diarrhea with recent C. difficile colitis, poor oral intake.  Patient had presented with progressive worsening generalized weakness, and hyperkalemia.  Creatinine on admission 4.94.  Patient also noted to have a metabolic acidosis. Renal function slowly improving with creatinine currently at 4.39 from 4.94 on admission.  Urine output of 260 cc since admission.  Bicarb slowly improving.  Urinalysis with large leukocytes, nitrite negative, 100 protein.  Check a renal ultrasound..  Continue bicarb drip.  If no significant improvement with renal function in the next 24 hours we will consult with nephrology for further evaluation and management.  2.  Stool culture positivity for C. difficile colitis/diarrhea Patient recently diagnosed with positive C. difficile during hospitalization at [facility] 07/30/2020>>> 08/02/2020.  Patient presenting back with ongoing diarrhea.  Initial concern was secondary to C. difficile infection due to lack of response to extended course of oral vancomycin as well as an approximate 10-day course of Dificid.  Patient placed on oral Dificid.  Stool studies negative for C. difficile.  GI pathogen panel pending.  Concern as to whether patient is Crohn disease  playing a role with her diarrhea.  Currently  tolerating clear liquid diarrhea.  IV fluids.  Supportive care.  Currently on Dificid. ??  Continuation of Dificid however will defer until patient has been seen in consultation by GI.  GI consultation pending.  3.  Crohn's disease of the jejunum Patient with longstanding history of Crohn's disease diagnosed in 45.  Status post colectomy 1998 with ileoproctostomy and J-pouch revision. Patient being followed by Dr. Havery Moros with gastroenterology currently receiving outpatient infusions of Entyvio.  Patient noted now to be suffering from chronic pouchitis.  GI consultation pending.  4.  Chronic diarrhea Patient with history of longstanding chronic diarrhea due to chronic pouchitis and Crohn's disease.  GI consultation pending.  5.  Hypertension Continue metoprolol.  6.  GERD Continue famotidine.  7.  Hypoglycemia Patient noted to have a CBG of 53 however remained asymptomatic.  Patient received a dose of D50.  Will place on D5 normal saline at 10 cc/h.  Patient also on IV fluids.  Follow.  8.  History of gout Stable.  Allopurinol on hold due to worsening renal function.   DVT prophylaxis: Heparin Code Status: Full Family Communication: Updated patient.  No family at bedside. Disposition:   Status is: Observation    Dispo: The patient is from: Home              Anticipated d/c is to: Home              Anticipated d/c date is: 2 to 3 days              Patient currently with multiple loose stools, GI evaluation pending.  Not stable for discharge.       Consultants:   GI pending  Procedures:   None  Antimicrobials:  Dificid 08/23/2020>>>> 09/01/2020   Subjective: Patient laying in bed.  Patient on the telephone.  Denies any chest pain or shortness of breath.  No significant abdominal pain.  Asking whether she can eat.  States frequency of loose stools improving.  Denies any bleeding.  Somewhat tearful.  Objective: Vitals:   08/23/20 0143 08/23/20 0150 08/23/20  0536 08/23/20 1023  BP:  123/63 113/72 122/74  Pulse: 97 73 97 88  Resp: 18 16 16 16   Temp:  98.3 F (36.8 C) 97.8 F (36.6 C) 98 F (36.7 C)  TempSrc:  Oral Oral Oral  SpO2: 100% 99% 99% 100%  Weight:      Height:        Intake/Output Summary (Last 24 hours) at 08/23/2020 1403 Last data filed at 08/23/2020 0900 Gross per 24 hour  Intake 2598.21 ml  Output 260 ml  Net 2338.21 ml   Filed Weights   08/22/20 1845  Weight: 88.5 kg    Examination:  General exam: Appears calm and comfortable  Respiratory system: Clear to auscultation. Respiratory effort normal. Cardiovascular system: S1 & S2 heard, RRR. No JVD, murmurs, rubs, gallops or clicks. No pedal edema. Gastrointestinal system: Abdomen is nondistended, soft and nontender. No organomegaly or masses felt. Normal bowel sounds heard. Central nervous system: Alert and oriented. No focal neurological deficits. Extremities: Symmetric 5 x 5 power. Skin: No rashes, lesions or ulcers Psychiatry: Judgement and insight appear normal. Mood & affect appropriate.     Data Reviewed: I have personally reviewed following labs and imaging studies  CBC: Recent Labs  Lab 08/22/20 1452 08/22/20 2009 08/23/20 0727  WBC 15.8* 14.5* 11.9*  NEUTROABS  --   --  6.9  HGB 12.1 12.5 10.5*  HCT 37.3 39.1 32.3*  MCV 102.1* 103.7* 103.2*  PLT 458.0* 407* 163    Basic Metabolic Panel: Recent Labs  Lab 08/22/20 1452 08/22/20 2009 08/23/20 0727  NA 132* 132* 134*  K 4.0 4.1 3.3*  CL 105 106 108  CO2 14* 13* 14*  GLUCOSE 118* 96 94  BUN 77* 78* 76*  CREATININE 4.85* 4.94* 4.39*  CALCIUM 9.6 9.2 8.5*  MG  --   --  1.7    GFR: Estimated Creatinine Clearance: 13.3 mL/min (A) (by C-G formula based on SCr of 4.39 mg/dL (H)).  Liver Function Tests: Recent Labs  Lab 08/22/20 2009 08/23/20 0727  AST 19 16  ALT 22 17  ALKPHOS 108 91  BILITOT 0.3 0.7  PROT 8.4* 6.9  ALBUMIN 4.1 3.4*    CBG: Recent Labs  Lab 08/23/20 0141  08/23/20 0748 08/23/20 1203  GLUCAP 95 95 112*     Recent Results (from the past 240 hour(s))  Resp Panel by RT-PCR (Flu A&B, Covid) Nasopharyngeal Swab     Status: None   Collection Time: 08/22/20  9:24 PM   Specimen: Nasopharyngeal Swab; Nasopharyngeal(NP) swabs in vial transport medium  Result Value Ref Range Status   SARS Coronavirus 2 by RT PCR NEGATIVE NEGATIVE Final    Comment: (NOTE) SARS-CoV-2 target nucleic acids are NOT DETECTED.  The SARS-CoV-2 RNA is generally detectable in upper respiratory specimens during the acute phase of infection. The lowest concentration of SARS-CoV-2 viral copies this assay can detect is 138 copies/mL. A negative result does not preclude SARS-Cov-2 infection and should not be used as the sole basis for treatment or other patient management decisions. A negative result may occur with  improper specimen collection/handling, submission of specimen other than nasopharyngeal swab, presence of viral mutation(s) within the areas targeted by this assay, and inadequate number of viral copies(<138 copies/mL). A negative result must be combined with clinical observations, patient history, and epidemiological information. The expected result is Negative.  Fact Sheet for Patients:  EntrepreneurPulse.com.au  Fact Sheet for Healthcare Providers:  IncredibleEmployment.be  This test is no t yet approved or cleared by the [place] FDA and  has been authorized for detection and/or diagnosis of SARS-CoV-2 by FDA under an Emergency Use Authorization (EUA). This EUA will remain  in effect (meaning this test can be used) for the duration of the COVID-19 declaration under Section 564(b)(1) of the Act, 21 U.S.C.section 360bbb-3(b)(1), unless the authorization is terminated  or revoked sooner.       Influenza A by PCR NEGATIVE NEGATIVE Final   Influenza B by PCR NEGATIVE NEGATIVE Final    Comment: (NOTE) The Xpert  Xpress SARS-CoV-2/FLU/RSV plus assay is intended as an aid in the diagnosis of influenza from Nasopharyngeal swab specimens and should not be used as a sole basis for treatment. Nasal washings and aspirates are unacceptable for Xpert Xpress SARS-CoV-2/FLU/RSV testing.  Fact Sheet for Patients: EntrepreneurPulse.com.au  Fact Sheet for Healthcare Providers: IncredibleEmployment.be  This test is not yet approved or cleared by the [place] FDA and has been authorized for detection and/or diagnosis of SARS-CoV-2 by FDA under an Emergency Use Authorization (EUA). This EUA will remain in effect (meaning this test can be used) for the duration of the COVID-19 declaration under Section 564(b)(1) of the Act, 21 U.S.C. section 360bbb-3(b)(1), unless the authorization is terminated or revoked.  Performed at [facility], [place] [address] [place], [place] [address]   C Difficile  Quick Screen w PCR reflex     Status: None   Collection Time: 08/23/20  2:00 AM   Specimen: STOOL  Result Value Ref Range Status   C Diff antigen NEGATIVE NEGATIVE Final   C Diff toxin NEGATIVE NEGATIVE Final   C Diff interpretation No C. difficile detected.  Final    Comment: No C. difficile detected. Performed at [facility], [place] [address], [place], [place] [address]          Radiology Studies: No results found.      Scheduled Meds: . buPROPion  150 mg Oral Daily  . DULoxetine  120 mg Oral Daily  . famotidine  20 mg Oral BID  . fidaxomicin  200 mg Oral BID  . folic acid  2 mg Oral Daily  . gabapentin  100 mg Oral BID  . heparin  5,000 Units Subcutaneous Q8H  . insulin aspart  0-15 Units Subcutaneous TID AC & HS  . metoprolol succinate  50 mg Oral BID   Continuous Infusions: .  sodium bicarbonate (isotonic) infusion in sterile water 125 mL/hr at 08/23/20 0732     LOS: 0 days    Time spent: 40  minutes    Irine Seal, MD [facility]   To contact the attending provider between 7A-7P or the covering provider during after hours 7P-7A, please log into the web site [email or web address] and access using universal [place] password for that web site. If you do not have the password, please call the hospital operator.  08/23/2020, 2:03 PM

## 2020-08-23 NOTE — Progress Notes (Signed)
Hypoglycemic Event  CBG: 53  Treatment: 1/2 amp D50 pushed IV per protocol. Pt not tolerating PO well at this time.   Symptoms: pt asymptomatic.   Follow-up CBG: Time: 1729 CBG Result: 153  Possible Reasons for Event: Pt not tolerating PO well at this time.   Comments/MD notified: MD made aware.      Rueben Bash

## 2020-08-24 LAB — GASTROINTESTINAL PANEL BY PCR, STOOL (REPLACES STOOL CULTURE)

## 2020-08-24 LAB — CBC WITH DIFFERENTIAL/PLATELET
Abs Immature Granulocytes: 0.02 10*3/uL (ref 0.00–0.07)
Basophils Absolute: 0 10*3/uL (ref 0.0–0.1)
Basophils Relative: 1 %
Eosinophils Absolute: 0.6 10*3/uL — ABNORMAL HIGH (ref 0.0–0.5)
Eosinophils Relative: 8 %
HCT: 28.9 % — ABNORMAL LOW (ref 36.0–46.0)
Hemoglobin: 9.5 g/dL — ABNORMAL LOW (ref 12.0–15.0)
Immature Granulocytes: 0 %
Lymphocytes Relative: 35 %
Lymphs Abs: 2.7 10*3/uL (ref 0.7–4.0)
MCH: 32.8 pg (ref 26.0–34.0)
MCHC: 32.9 g/dL (ref 30.0–36.0)
MCV: 99.7 fL (ref 80.0–100.0)
Monocytes Absolute: 0.6 10*3/uL (ref 0.1–1.0)
Monocytes Relative: 8 %
Neutro Abs: 3.8 10*3/uL (ref 1.7–7.7)
Neutrophils Relative %: 48 %
Platelets: 287 10*3/uL (ref 150–400)
RBC: 2.9 MIL/uL — ABNORMAL LOW (ref 3.87–5.11)
RDW: 14.6 % (ref 11.5–15.5)
WBC: 7.7 10*3/uL (ref 4.0–10.5)
nRBC: 0 % (ref 0.0–0.2)

## 2020-08-24 LAB — GLUCOSE, CAPILLARY
Glucose-Capillary: 91 mg/dL (ref 70–99)
Glucose-Capillary: 112 mg/dL — ABNORMAL HIGH (ref 70–99)
Glucose-Capillary: 99 mg/dL (ref 70–99)
Glucose-Capillary: 136 mg/dL — ABNORMAL HIGH (ref 70–99)

## 2020-08-24 LAB — MAGNESIUM: Magnesium: 1.5 mg/dL — ABNORMAL LOW (ref 1.7–2.4)

## 2020-08-24 LAB — RENAL FUNCTION PANEL
Albumin: 3 g/dL — ABNORMAL LOW (ref 3.5–5.0)
Anion gap: 7 (ref 5–15)
BUN: 58 mg/dL — ABNORMAL HIGH (ref 8–23)
CO2: 30 mmol/L (ref 22–32)
Calcium: 8.1 mg/dL — ABNORMAL LOW (ref 8.9–10.3)
Chloride: 101 mmol/L (ref 98–111)
Creatinine, Ser: 3.33 mg/dL — ABNORMAL HIGH (ref 0.44–1.00)
GFR, Estimated: 15 mL/min — ABNORMAL LOW (ref >60)
Glucose, Bld: 95 mg/dL (ref 70–99)
Phosphorus: 3.7 mg/dL (ref 2.5–4.6)
Potassium: 3.1 mmol/L — ABNORMAL LOW (ref 3.5–5.1)
Sodium: 138 mmol/L (ref 135–145)

## 2020-08-24 MED ORDER — POTASSIUM CHLORIDE CRYS ER 20 MEQ PO TBCR
40.0000 meq | EXTENDED_RELEASE_TABLET | Freq: Once | ORAL | Status: AC
  Administered 2020-08-24: 40 meq via ORAL
  Filled 2020-08-24: qty 2

## 2020-08-24 MED ORDER — MAGNESIUM SULFATE 2 GM/50ML IV SOLN
2.0000 g | Freq: Once | INTRAVENOUS | Status: AC
  Administered 2020-08-24: 2 g via INTRAVENOUS
  Filled 2020-08-24: qty 50

## 2020-08-24 MED ORDER — SODIUM CHLORIDE 0.9 % IV SOLN: INTRAVENOUS | Status: DC

## 2020-08-24 NOTE — Progress Notes (Signed)
PROGRESS NOTE    [name]  ZJI:[id number] DOB: [birth year] DOA: 08/22/2020 PCP: Ann Held, DO    Chief Complaint  Patient presents with  . Abnormal Lab    Brief Narrative:  HPI per Dr. [name] 64 year old female with past medical history of Crohn's disease (Dx 1990) status post colectomy with ileoproctostomy and J-pouch revision in 1998 now with subsequent chronic pouchitis, chronic kidney disease stage IV, restless leg syndrome, gout, gastroesophageal reflux disease who presents to [facility] emergency department due to progressively worsening weakness.  Of note, patient was recently hospitalized at [facility] from 11/24 until 11/27 for acute kidney injury.  It was suspected to be suffering from volume depletion secondary to C. difficile colitis.  After renal function returned to baseline patient was eventually discharged home on oral vancomycin.  After discharge, patient continued to experience diarrhea and followed up with her primary care provider on 12/7.  That time, primary care provider documents that case was discussed with Dr. Havery Moros with gastroenterology and it was recommended the patient be switched to [place].  Patient states that despite this medication change she has continued to experience frequent bouts of watery diarrhea.  Patient denies any significant associated abdominal pain.  Patient denies fevers or sick contacts.  As patient's symptoms continue to persist over the next several weeks, patient began to develop progressively worsening generalized weakness.  Weakness is severe in intensity worse with exertion and improved with rest.  Weakness is associated with lightheadedness.  Patient eventually presented to [facility] emergency department for evaluation.  Upon evaluation in the emergency department patient has been found to be suffering from acute kidney injury with creatinine of 4.94 as well as substantial metabolic  acidosis with bicarbonate on initial chemistry of 13.  Patient was initiated on intravenous hydration and the hospitalist group was then called to assist patient for admission the hospital.   Assessment & Plan:   Principal Problem:   Acute renal failure superimposed on stage 4 chronic kidney disease ([place]) Active Problems:   Essential hypertension   GERD with esophagitis   Stool culture positive for Clostridioides difficile   Gout   Crohn's disease of jejunum (HCC)   Chronic diarrhea   Metabolic acidosis   ARF (acute renal failure) (HCC)  1 acute renal failure on chronic kidney disease stage IV/metabolic acidosis Baseline creatinine approximately 2.21.  Likely secondary to a prerenal azotemia in the setting of diarrhea with recent C. difficile colitis, poor oral intake.  Patient had presented with progressive worsening generalized weakness, and hyperkalemia.  Creatinine on admission 4.94.  Patient also noted to have a metabolic acidosis. Renal function slowly improving with creatinine currently at 3.33 from 4.39 from 4.94 on admission.  Urine output of 2.2 L over the past 24 hours.  Metabolic acidosis improved.  Discontinue bicarb drip.  Place on normal saline at 100 cc an hour.  Renal ultrasound with diffusely increased renal cortical echogenicity compatible medical renal disease, negative for hydronephrosis.  Continue gentle hydration and follow.   2.  Stool culture positivity for C. difficile colitis/diarrhea Patient recently diagnosed with positive C. difficile during hospitalization at [facility] 07/30/2020>>> 08/02/2020.  Patient presenting back with ongoing diarrhea.  Initial concern was secondary to C. difficile infection due to lack of response to extended course of oral vancomycin as well as an approximate 10-day course of Dificid.  Patient placed on oral Dificid.  Stool studies negative for C. difficile.  GI pathogen panel negative.  Clinical improvement with loose stools.  Concern as to  whether patient is Crohn disease playing a role with her diarrhea.  Currently tolerating clear liquids and advance as tolerated to a regular diet.  IV fluids.  Supportive care.  Currently on Dificid for 1-2 more days.  GI consulted and are following.  3.  Crohn's disease of the jejunum Patient with longstanding history of Crohn's disease diagnosed in 62.  Status post colectomy 1998 with ileoproctostomy and J-pouch revision. Patient being followed by Dr. Havery Moros with gastroenterology currently receiving outpatient infusions of Entyvio.  Patient noted now to be suffering from chronic pouchitis.  GI consulted and are following.   4.  Chronic diarrhea Patient with history of longstanding chronic diarrhea due to chronic pouchitis and Crohn's disease.  GI ff.  5.  Hypertension Continue metoprolol.  6.  GERD Famotidine.  7.  Hypoglycemia Patient noted to have a CBG of 53 (08/23/2020 ) however remained asymptomatic.  Patient received a dose of D50.  Diet being advanced and currently on D5 normal saline at 10 cc an hour.  Will place on D5 normal saline at 10 cc/h.  Diet being advanced.  Follow.    8.  History of gout Stable.  Allopurinol on hold due to worsening renal function.  9.  Hypokalemia Replete.   DVT prophylaxis: Heparin Code Status: Full Family Communication: Updated patient.  No family at bedside. Disposition:   Status is: Observation    Dispo: The patient is from: Home              Anticipated d/c is to: Home              Anticipated d/c date is: 1 to 2 days              Patient currently with multiple loose stools, GI following, patient also with acute on chronic renal failure.  Not stable for discharge.        Consultants:   GI: Dr. Collene Mares 08/23/2020  Procedures:   None  Antimicrobials:  Dificid 08/23/2020>>>>    Subjective: Patient states she is feeling better.  Denies any chest pain.  Denies any shortness of breath.  States diarrhea has improved and  close to her usual chronic diarrhea.  Denies any bleeding.  States she is hungry and wanting diet to be advanced.   Objective: Vitals:   08/23/20 1023 08/23/20 1448 08/23/20 2142 08/24/20 0503  BP: 122/74 (!) 127/92 139/85 127/80  Pulse: 88 87 90 87  Resp: 16 17 19 18   Temp: 98 F (36.7 C) 98 F (36.7 C) 97.8 F (36.6 C) 98.2 F (36.8 C)  TempSrc: Oral Oral Oral Oral  SpO2: 100% 99% 100% 96%  Weight:      Height:        Intake/Output Summary (Last 24 hours) at 08/24/2020 1258 Last data filed at 08/24/2020 0900 Gross per 24 hour  Intake 2786.67 ml  Output 2400 ml  Net 386.67 ml   Filed Weights   08/22/20 1845  Weight: 88.5 kg    Examination:  General exam: NAD Respiratory system: CTA B.  No wheezes, no crackles, no rhonchi.  Normal respiratory effort. Cardiovascular system: Regular rate rhythm no murmurs rubs or gallops.  No JVD.  No lower extremity edema. Gastrointestinal system: Abdomen is soft, nontender, nondistended, positive bowel sounds.  No rebound.  No guarding. Central nervous system: Alert and oriented. No focal neurological deficits. Extremities: Symmetric 5 x 5 power. Skin: No rashes, lesions or  ulcers Psychiatry: Judgement and insight appear normal. Mood & affect appropriate.     Data Reviewed: I have personally reviewed following labs and imaging studies  CBC: Recent Labs  Lab 08/22/20 1452 08/22/20 2009 08/23/20 0727 08/24/20 0606  WBC 15.8* 14.5* 11.9* 7.7  NEUTROABS  --   --  6.9 3.8  HGB 12.1 12.5 10.5* 9.5*  HCT 37.3 39.1 32.3* 28.9*  MCV 102.1* 103.7* 103.2* 99.7  PLT 458.0* 407* 327 128    Basic Metabolic Panel: Recent Labs  Lab 08/22/20 1452 08/22/20 2009 08/23/20 0727 08/24/20 0606  NA 132* 132* 134* 138  K 4.0 4.1 3.3* 3.1*  CL 105 106 108 101  CO2 14* 13* 14* 30  GLUCOSE 118* 96 94 95  BUN 77* 78* 76* 58*  CREATININE 4.85* 4.94* 4.39* 3.33*  CALCIUM 9.6 9.2 8.5* 8.1*  MG  --   --  1.7 1.5*  PHOS  --   --   --  3.7     GFR: Estimated Creatinine Clearance: 17.5 mL/min (A) (by C-G formula based on SCr of 3.33 mg/dL (H)).  Liver Function Tests: Recent Labs  Lab 08/22/20 2009 08/23/20 0727 08/24/20 0606  AST 19 16  --   ALT 22 17  --   ALKPHOS 108 91  --   BILITOT 0.3 0.7  --   PROT 8.4* 6.9  --   ALBUMIN 4.1 3.4* 3.0*    CBG: Recent Labs  Lab 08/23/20 1724 08/23/20 1750 08/23/20 2153 08/24/20 0745 08/24/20 1225  GLUCAP 53* 158* 98 91 112*     Recent Results (from the past 240 hour(s))  Resp Panel by RT-PCR (Flu A&B, Covid) Nasopharyngeal Swab     Status: None   Collection Time: 08/22/20  9:24 PM   Specimen: Nasopharyngeal Swab; Nasopharyngeal(NP) swabs in vial transport medium  Result Value Ref Range Status   SARS Coronavirus 2 by RT PCR NEGATIVE NEGATIVE Final    Comment: (NOTE) SARS-CoV-2 target nucleic acids are NOT DETECTED.  The SARS-CoV-2 RNA is generally detectable in upper respiratory specimens during the acute phase of infection. The lowest concentration of SARS-CoV-2 viral copies this assay can detect is 138 copies/mL. A negative result does not preclude SARS-Cov-2 infection and should not be used as the sole basis for treatment or other patient management decisions. A negative result may occur with  improper specimen collection/handling, submission of specimen other than nasopharyngeal swab, presence of viral mutation(s) within the areas targeted by this assay, and inadequate number of viral copies(<138 copies/mL). A negative result must be combined with clinical observations, patient history, and epidemiological information. The expected result is Negative.  Fact Sheet for Patients:  EntrepreneurPulse.com.au  Fact Sheet for Healthcare Providers:  IncredibleEmployment.be  This test is no t yet approved or cleared by the [place] FDA and  has been authorized for detection and/or diagnosis of SARS-CoV-2 by FDA under an  Emergency Use Authorization (EUA). This EUA will remain  in effect (meaning this test can be used) for the duration of the COVID-19 declaration under Section 564(b)(1) of the Act, 21 U.S.C.section 360bbb-3(b)(1), unless the authorization is terminated  or revoked sooner.       Influenza A by PCR NEGATIVE NEGATIVE Final   Influenza B by PCR NEGATIVE NEGATIVE Final    Comment: (NOTE) The Xpert Xpress SARS-CoV-2/FLU/RSV plus assay is intended as an aid in the diagnosis of influenza from Nasopharyngeal swab specimens and should not be used as a sole basis for treatment. Nasal washings  and aspirates are unacceptable for Xpert Xpress SARS-CoV-2/FLU/RSV testing.  Fact Sheet for Patients: EntrepreneurPulse.com.au  Fact Sheet for Healthcare Providers: IncredibleEmployment.be  This test is not yet approved or cleared by the [place] FDA and has been authorized for detection and/or diagnosis of SARS-CoV-2 by FDA under an Emergency Use Authorization (EUA). This EUA will remain in effect (meaning this test can be used) for the duration of the COVID-19 declaration under Section 564(b)(1) of the Act, 21 U.S.C. section 360bbb-3(b)(1), unless the authorization is terminated or revoked.  Performed at [facility] [place], [place] [address] [place], [place] [address]   Culture, blood (routine x 2)     Status: None (Preliminary result)   Collection Time: 08/22/20  9:48 PM   Specimen: BLOOD  Result Value Ref Range Status   Specimen Description   Final    BLOOD RIGHT WRIST Performed at [place] [address] [place], [place] [address]    Special Requests   Final    BOTTLES DRAWN AEROBIC AND ANAEROBIC Blood Culture adequate volume Performed at [place] [address], [place], [place] [address]    Culture   Final    NO GROWTH 1 DAY Performed at [facility], [place] [address]  [place], [place] [address]    Report Status PENDING  Incomplete  Culture, blood (routine x 2)     Status: None (Preliminary result)   Collection Time: 08/22/20  9:53 PM   Specimen: BLOOD  Result Value Ref Range Status   Specimen Description   Final    BLOOD LEFT ANTECUBITAL Performed at [place] [address] [place], [place] [address]    Special Requests   Final    BOTTLES DRAWN AEROBIC AND ANAEROBIC Blood Culture adequate volume Performed at [place] [address], [place], [place] [address]    Culture   Final    NO GROWTH 1 DAY Performed at [facility], [place] [address], [place], [place] [address]    Report Status PENDING  Incomplete  Gastrointestinal Panel by PCR , Stool     Status: None   Collection Time: 08/23/20  2:00 AM   Specimen: STOOL  Result Value Ref Range Status   Campylobacter species NOT DETECTED NOT DETECTED Final   Plesimonas shigelloides NOT DETECTED NOT DETECTED Final   Salmonella species NOT DETECTED NOT DETECTED Final   Yersinia enterocolitica NOT DETECTED NOT DETECTED Final   Vibrio species NOT DETECTED NOT DETECTED Final   Vibrio cholerae NOT DETECTED NOT DETECTED Final   Enteroaggregative E coli (EAEC) NOT DETECTED NOT DETECTED Final   Enteropathogenic E coli (EPEC) NOT DETECTED NOT DETECTED Final   Enterotoxigenic E coli (ETEC) NOT DETECTED NOT DETECTED Final   Shiga like toxin producing E coli (STEC) NOT DETECTED NOT DETECTED Final   Shigella/Enteroinvasive E coli (EIEC) NOT DETECTED NOT DETECTED Final   Cryptosporidium NOT DETECTED NOT DETECTED Final   Cyclospora cayetanensis NOT DETECTED NOT DETECTED Final   Entamoeba histolytica NOT DETECTED NOT DETECTED Final   Giardia lamblia NOT DETECTED NOT DETECTED Final   Adenovirus F40/41 NOT DETECTED NOT DETECTED Final   Astrovirus NOT DETECTED NOT DETECTED Final   Norovirus GI/GII NOT DETECTED NOT DETECTED Final   Rotavirus A NOT DETECTED NOT DETECTED  Final   Sapovirus (I, II, IV, and V) NOT DETECTED NOT DETECTED Final    Comment: Performed at [facility], [place] [place], [place]  [address]  C Difficile Quick Screen w PCR reflex     Status: None   Collection Time: 08/23/20  2:00 AM   Specimen: STOOL  Result Value Ref Range Status   C Diff antigen NEGATIVE NEGATIVE Final   C Diff toxin NEGATIVE NEGATIVE Final   C Diff interpretation No C. difficile detected.  Final    Comment: No C. difficile detected. Performed at [facility], [place] [address] [place], [place] [address]          Radiology Studies: US RENAL  Result Date: 08/23/2020 CLINICAL DATA:  Acute renal failure EXAM: RENAL / URINARY TRACT ULTRASOUND COMPLETE COMPARISON:  MR 06/24/2020, ultrasound 01/07/2015, CT 04/28/2011 FINDINGS: Right Kidney: Renal measurements: 9.8 x 4.5 x 4.7 cm = volume: 107.8 mL. Diffusely increased renal cortical echogenicity. No concerning renal mass, shadowing calculus or hydronephrosis. Left Kidney: Renal measurements: 9.4 x 5.2 x 5.3 cm = volume: 135.2 mL. Diffusely increased renal cortical echogenicity. No concerning renal mass, shadowing calculus or hydronephrosis. Bladder: Normal appearance for the degree of distention. Bilateral bladder jets are identified. Other: None. IMPRESSION: 1. Diffusely increased renal cortical echogenicity compatible with medical renal disease. 2. Otherwise unremarkable urinary tract ultrasound. Electronically Signed   By: Lovena Le M.D.   On: 08/23/2020 21:28        Scheduled Meds: . buPROPion  150 mg Oral Daily  . DULoxetine  120 mg Oral Daily  . famotidine  20 mg Oral Daily  . fidaxomicin  200 mg Oral BID  . folic acid  2 mg Oral Daily  . gabapentin  100 mg Oral BID  . heparin  5,000 Units Subcutaneous Q8H  . insulin aspart  0-15 Units Subcutaneous TID AC & HS  . metoprolol succinate  50 mg Oral BID   Continuous Infusions: . sodium chloride 100 mL/hr at 08/24/20  1054  . dextrose 5 % and 0.9% NaCl 10 mL/hr at 08/23/20 2220     LOS: 1 day    Time spent: 40 minutes    Irine Seal, MD [facility]   To contact the attending provider between 7A-7P or the covering provider during after hours 7P-7A, please log into the web site [email or web address] and access using universal [place] password for that web site. If you do not have the password, please call the hospital operator.  08/24/2020, 12:58 PM

## 2020-08-24 NOTE — Progress Notes (Addendum)
CROSS COVER [facility]-GI Subjective: [name] is a 64 year old white female with history of diarrhea status post a total colectomy for Crohn's disease admitted with severe dehydration after C. difficile infection.  Patient is finishing a course of Dificid and is under the care of Dr. Nicki Guadalajara. She has not had any diarrhea since she was admitted. Prior to admission she was having up to 15 BMs per day but had 1 BM yesterday and 2 loose BMs today. She had problems with recurrent pouchitis.  She denies having any nausea, vomiting, abdominal pain, fever chills or rigors at the present time.  Objective: Vital signs in last 24 hours: Temp:  [97.8 F (36.6 C)-98.2 F (36.8 C)] 98.2 F (36.8 C) (12/19 0503) Pulse Rate:  [87-90] 87 (12/19 0503) Resp:  [16-19] 18 (12/19 0503) BP: (122-139)/(74-92) 127/80 (12/19 0503) SpO2:  [96 %-100 %] 96 % (12/19 0503) Last BM Date: 08/23/20  Intake/Output from previous day: 12/18 0701 - 12/19 0700 In: 3964.9 [P.O.:890; I.V.:3074.9] Out: 2200 [Urine:2200] Intake/Output this shift: Total I/O In: -  Out: 200 [Urine:200]  Resp: clear to auscultation bilaterally Cardio: regular rate and rhythm, S1, S2 normal, no murmur, click, rub or gallop GI: soft, non-tender; bowel sounds normal; no masses,  no organomegaly Extremities: extremities normal, atraumatic, no cyanosis or edema  Lab Results: Recent Labs    08/22/20 2009 08/23/20 0727 08/24/20 0606  WBC 14.5* 11.9* 7.7  HGB 12.5 10.5* 9.5*  HCT 39.1 32.3* 28.9*  PLT 407* 327 287   BMET Recent Labs    08/22/20 2009 08/23/20 0727 08/24/20 0606  NA 132* 134* 138  K 4.1 3.3* 3.1*  CL 106 108 101  CO2 13* 14* 30  GLUCOSE 96 94 95  BUN 78* 76* 58*  CREATININE 4.94* 4.39* 3.33*  CALCIUM 9.2 8.5* 8.1*   LFT Recent Labs    08/23/20 0727 08/24/20 0606  PROT 6.9  --   ALBUMIN 3.4* 3.0*  AST 16  --   ALT 17  --   ALKPHOS 91  --   BILITOT 0.7  --    C-Diff Recent Labs    08/23/20 0200   CDIFFTOX NEGATIVE   Studies/Results: US RENAL  Result Date: 08/23/2020 CLINICAL DATA:  Acute renal failure EXAM: RENAL / URINARY TRACT ULTRASOUND COMPLETE COMPARISON:  MR 06/24/2020, ultrasound 01/07/2015, CT 04/28/2011 FINDINGS: Right Kidney: Renal measurements: 9.8 x 4.5 x 4.7 cm = volume: 107.8 mL. Diffusely increased renal cortical echogenicity. No concerning renal mass, shadowing calculus or hydronephrosis. Left Kidney: Renal measurements: 9.4 x 5.2 x 5.3 cm = volume: 135.2 mL. Diffusely increased renal cortical echogenicity. No concerning renal mass, shadowing calculus or hydronephrosis. Bladder: Normal appearance for the degree of distention. Bilateral bladder jets are identified. Other: None. IMPRESSION: 1. Diffusely increased renal cortical echogenicity compatible with medical renal disease. 2. Otherwise unremarkable urinary tract ultrasound. Electronically Signed   By: Lovena Le M.D.   On: 08/23/2020 21:28   Medications: I have reviewed the patient's current medications.  Assessment/Plan: 1) C. difficile colitis with severe dehydration in the setting of total colectomy with ileoproctostomy and J-pouch revision causing acute kidney injury-diarrhea seems to have improved; renal function is improving slowly with creatinine at 3.3 today down from 4.94 on admission. Patient is on Dificid-seems to be improving slowly; we will advance her diet as tolerated. 2) History of Crohn's disease status post colectomy with ileoproctostomy and J-pouch revision in 1998 with chronic pouchitis. 3) Gastroesophageal reflux disease. 4) Chronic kidney disease stage IV.  5) Restless leg syndrome. 6) Anemia with a drop in hemoglobin from 12.5 g/dL on admission to  9.5 g/dL today-probably dilutional.  Will need close follow-up. Dr. Nicki Guadalajara is to see the patient tomorrow in follow-up.  LOS: 1 day   Juanita Craver 08/24/2020, 8:42 AM

## 2020-08-25 DIAGNOSIS — E876 Hypokalemia: Secondary | ICD-10-CM

## 2020-08-25 DIAGNOSIS — K5000 Crohn's disease of small intestine without complications: Secondary | ICD-10-CM

## 2020-08-25 DIAGNOSIS — E860 Dehydration: Secondary | ICD-10-CM

## 2020-08-25 LAB — URINE CULTURE: Culture: >=100000 — AB

## 2020-08-25 LAB — CBC
HCT: 29.4 % — ABNORMAL LOW (ref 36.0–46.0)
Hemoglobin: 9.5 g/dL — ABNORMAL LOW (ref 12.0–15.0)
MCH: 33.1 pg (ref 26.0–34.0)
MCHC: 32.3 g/dL (ref 30.0–36.0)
MCV: 102.4 fL — ABNORMAL HIGH (ref 80.0–100.0)
Platelets: 278 10*3/uL (ref 150–400)
RBC: 2.87 MIL/uL — ABNORMAL LOW (ref 3.87–5.11)
RDW: 14.9 % (ref 11.5–15.5)
WBC: 6.9 10*3/uL (ref 4.0–10.5)
nRBC: 0 % (ref 0.0–0.2)

## 2020-08-25 LAB — RENAL FUNCTION PANEL
Albumin: 3 g/dL — ABNORMAL LOW (ref 3.5–5.0)
Anion gap: 13 (ref 5–15)
BUN: 38 mg/dL — ABNORMAL HIGH (ref 8–23)
CO2: 27 mmol/L (ref 22–32)
Calcium: 8 mg/dL — ABNORMAL LOW (ref 8.9–10.3)
Chloride: 103 mmol/L (ref 98–111)
Creatinine, Ser: 2.46 mg/dL — ABNORMAL HIGH (ref 0.44–1.00)
GFR, Estimated: 21 mL/min — ABNORMAL LOW (ref >60)
Glucose, Bld: 105 mg/dL — ABNORMAL HIGH (ref 70–99)
Phosphorus: 2.4 mg/dL — ABNORMAL LOW (ref 2.5–4.6)
Potassium: 3 mmol/L — ABNORMAL LOW (ref 3.5–5.1)
Sodium: 143 mmol/L (ref 135–145)

## 2020-08-25 LAB — GLUCOSE, CAPILLARY
Glucose-Capillary: 114 mg/dL — ABNORMAL HIGH (ref 70–99)
Glucose-Capillary: 108 mg/dL — ABNORMAL HIGH (ref 70–99)

## 2020-08-25 LAB — MAGNESIUM: Magnesium: 1.7 mg/dL (ref 1.7–2.4)

## 2020-08-25 MED ORDER — MAGNESIUM SULFATE 2 GM/50ML IV SOLN
2.0000 g | Freq: Once | INTRAVENOUS | Status: AC
  Administered 2020-08-25: 2 g via INTRAVENOUS
  Filled 2020-08-25: qty 50

## 2020-08-25 MED ORDER — ALLOPURINOL 300 MG PO TABS
300.0000 mg | ORAL_TABLET | Freq: Every day | ORAL | Status: DC
  Administered 2020-08-25: 300 mg via ORAL
  Filled 2020-08-25: qty 1

## 2020-08-25 MED ORDER — POTASSIUM CHLORIDE CRYS ER 20 MEQ PO TBCR
40.0000 meq | EXTENDED_RELEASE_TABLET | ORAL | Status: AC
  Administered 2020-08-25 (×2): 40 meq via ORAL
  Filled 2020-08-25 (×2): qty 2

## 2020-08-25 NOTE — Progress Notes (Addendum)
[name] Progress Note  CC:  C. Diff   Subjective: She denies having any abdominal pain. She passed 2 watery BMs with small bits of solid stool this morning which she stated is her normal bowel pattern. No rectal bleeding. No mucous per the rectum. She is tolerating a soft diet. She would like to go home.   Objective:  Vital signs in last 24 hours: Temp:  [97.7 F (36.5 C)-98 F (36.7 C)] 97.9 F (36.6 C) (12/20 0611) Pulse Rate:  [93-94] 94 (12/20 0611) Resp:  [15-20] 20 (12/20 0611) BP: (120-139)/(74-81) 138/80 (12/20 0611) SpO2:  [94 %-98 %] 94 % (12/20 0611) Last BM Date: 08/24/20 General:   Alert 64 year old female in NAD. Heart: RRR, no murmur.  Pulm:  Breath sounds clear throughout.  Abdomen: Soft, nondistended. Nontender. Hyperactive bowel sounds x 4 quads. Abdominal surgical scars intact. Extremities:  No edema.  Neurologic:  Alert and  oriented x4;  grossly normal neurologically. Psych:  Alert and cooperative. Normal mood and affect.  Intake/Output from previous day: 12/19 0701 - 12/20 0700 In: 1721.2 [P.O.:1000; I.V.:721.2] Out: 2000 [Urine:2000] Intake/Output this shift: No intake/output data recorded.  Lab Results: Recent Labs    08/23/20 0727 08/24/20 0606 08/25/20 0519  WBC 11.9* 7.7 6.9  HGB 10.5* 9.5* 9.5*  HCT 32.3* 28.9* 29.4*  PLT 327 287 278   BMET Recent Labs    08/23/20 0727 08/24/20 0606 08/25/20 0519  NA 134* 138 143  K 3.3* 3.1* 3.0*  CL 108 101 103  CO2 14* 30 27  GLUCOSE 94 95 105*  BUN 76* 58* 38*  CREATININE 4.39* 3.33* 2.46*  CALCIUM 8.5* 8.1* 8.0*   LFT Recent Labs    08/23/20 0727 08/24/20 0606 08/25/20 0519  PROT 6.9  --   --   ALBUMIN 3.4*   < > 3.0*  AST 16  --   --   ALT 17  --   --   ALKPHOS 91  --   --   BILITOT 0.7  --   --    < > = values in this interval not displayed.   PT/INR No results for input(s): LABPROT, INR in the last 72 hours. Hepatitis Panel No results for  input(s): HEPBSAG, HCVAB, HEPAIGM, HEPBIGM in the last 72 hours.  US RENAL  Result Date: 08/23/2020 CLINICAL DATA:  Acute renal failure EXAM: RENAL / URINARY TRACT ULTRASOUND COMPLETE COMPARISON:  MR 06/24/2020, ultrasound 01/07/2015, CT 04/28/2011 FINDINGS: Right Kidney: Renal measurements: 9.8 x 4.5 x 4.7 cm = volume: 107.8 mL. Diffusely increased renal cortical echogenicity. No concerning renal mass, shadowing calculus or hydronephrosis. Left Kidney: Renal measurements: 9.4 x 5.2 x 5.3 cm = volume: 135.2 mL. Diffusely increased renal cortical echogenicity. No concerning renal mass, shadowing calculus or hydronephrosis. Bladder: Normal appearance for the degree of distention. Bilateral bladder jets are identified. Other: None. IMPRESSION: 1. Diffusely increased renal cortical echogenicity compatible with medical renal disease. 2. Otherwise unremarkable urinary tract ultrasound. Electronically Signed   By: Lovena Le M.D.   On: 08/23/2020 21:28    Assessment / Plan:  86. 64 year old female with Crohn's disease initially diagnosed in 1990 s/p colectomy with ileoproctostomy and J-pouch revision in 1998 with subsequent pouchitis admitted [facility] from 07/30/2020 for AKI thought to be from severe dehydration associated with C. difficile colitis. Discharged home on oral Vanco 11/27 with persistent diarrhea started then started on Dificid. Admitted to [facility] 12/18 due to  generalized weakness and persistent diarrhea. Diarrhea and urgency has significantly improved. She passed 2 watery BMs with solid bits of solid stool this am which she stated was her normal bowel pattern.  -Continue Dificid 247m po bid, last doses due 12/27 -Soft diet  -Most likely will be discharged home today -Follow up with Dr. AHavery Morosin 2 to 3 weeks  2. GERD -Continue Pepcid 228mQD  3. AKI on CKD stage IV. Admission Cr 4.94 -> 2.46  4. Hypokalemia. K+ 3.0 -KCL replacement per the hospitalist   5. Macrocytic Anemia,  secondary to Crohn's disease, dilutional component. Hg 12.1 -> 9.5. No overt GI bleeding.  Principal Problem:   Acute renal failure superimposed on stage 4 chronic kidney disease (HCC) Active Problems:   Essential hypertension   GERD with esophagitis   Stool culture positive for Clostridioides difficile   Gout   Crohn's disease of jejunum (HCC)   Chronic diarrhea   Metabolic acidosis   [place]) [place]    LOS: 2 days   CoPatrecia [name]  08/25/2020, 7:52 AM  GI ATTENDING  Interval history data reviewed.  Patient personally seen and examined.  Agree with interval progress note as outlined above.  Patient is doing well.  States that her bowel habits are back to her baseline.  No abdominal pain.  Tolerating diet.  Wants to go home.  Okay for discharge home.  Complete course of Dificid.  Outpatient follow-up with Dr. ArHavery Morosn 2 to 3 weeks.  GI will sign off.  [name] M.D. [facility]

## 2020-08-25 NOTE — Telephone Encounter (Signed)
See 08/22/20 result note for more information. Patient was advised to go to ED for admission.

## 2020-08-25 NOTE — Discharge Summary (Signed)
Physician Discharge Summary  [name] MGN:[id number] DOB: 03-23-1956 DOA: 08/22/2020  PCP: Ann Held, DO  Admit date: 08/22/2020 Discharge date: 08/25/2020  Time spent: 50 minutes  Recommendations for Outpatient Follow-up:  1. Follow-up with Dr. Havery Moros, gastroenterology in 2 to 3 weeks. 2. Follow-up with Dr. Marval Regal, nephrology in 2 weeks.  On follow-up patient will need a renal profile done to follow-up on electrolytes, renal function.   Discharge Diagnoses:  Principal Problem:   Acute renal failure superimposed on stage 4 chronic kidney disease (HCC) Active Problems:   Essential hypertension   GERD with esophagitis   Stool culture positive for Clostridioides [name]   Gout   Crohn's disease of jejunum (HCC)   Chronic diarrhea   Metabolic acidosis   ARF (acute renal failure) ([name])   Discharge Condition: Stable and improved  Diet recommendation: Heart healthy  Filed Weights   08/22/20 1845  Weight: 88.5 kg    History of present illness:  HPI per Dr. 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  Of note, patient was recently hospitalized at [name] from 11/24 until 11/27 for acute kidney injury.  It was suspected to be suffering from volume depletion secondary to [name] colitis.  After renal function returned to baseline patient was eventually discharged home on oral vancomycin.  After discharge, patient continued to experience diarrhea and followed up with her primary care provider on 12/7.  That time, primary care provider documents that case was discussed with Dr. Havery Moros with gastroenterology  and it was recommended the patient be switched to [place].  Patient states that despite this medication change she has continued to experience frequent bouts of watery diarrhea.  Patient denies any significant associated abdominal pain.  Patient denies fevers or sick contacts.  As patient's symptoms continue to persist over the next several weeks, patient began to develop progressively worsening generalized weakness.  Weakness is severe in intensity worse with exertion and improved with rest.  Weakness is associated with lightheadedness.  Patient eventually presented to [name] for evaluation.  Upon evaluation in the emergency department patient has been found to be suffering from acute kidney injury with creatinine of 4.94 as well as substantial metabolic acidosis with bicarbonate on initial chemistry of 13.  Patient was initiated on intravenous hydration and the hospitalist group was then called to assist patient for admission the hospital.  Hospital Course:  1 acute renal failure on chronic kidney disease stage IV/metabolic acidosis Baseline creatinine approximately 2.21.  Likely secondary to a prerenal azotemia in the setting of diarrhea with recent [name] colitis, poor oral intake.  Patient had presented with progressive worsening generalized weakness, and hyperkalemia.  Creatinine on admission 4.94.  Patient also noted to have a metabolic acidosis. Patient was placed on a bicarb drip, hydrated with IV fluids and monitored.  Patient's metabolic acidosis improved and had resolved and bicarb drip subsequently discontinued.  Patient was hydrated gently.  Renal function improved such that by day of discharge creatinine was down to 2.46.  Renal ultrasound which was done, with diffusely increased renal cortical echogenicity compatible medical renal disease, negative for hydronephrosis.    Patient be discharged home in stable and improved condition.  Outpatient  follow-up with primary nephrologist Dr. Marval Regal  per patient preference.   2.  Stool culture positivity for [name] colitis/diarrhea Patient recently diagnosed with positive [name] during hospitalization at [name] 07/30/2020>>> 08/02/2020.  Patient presented back with ongoing diarrhea.  Initial concern was secondary to [name] infection due to lack of response to extended course of oral vancomycin as well as an approximate 10-day course of Dificid.  Patient placed on oral Dificid.  Stool studies negative for [name].  GI pathogen panel negative.    Concern as to whether patient is Crohn disease playing a role with her diarrhea.    Patient initially placed on clear liquids, and supportive care with IV fluids.  Patient improved clinically during the hospitalization.  Patient completed course of Dificid during her hospitalization.  Patient's loose stools improved and was back to her baseline diarrhea by day of discharge.  GI was consulted follow the patient throughout the hospitalization.  Outpatient follow-up with GI.   3.  Crohn's disease of the jejunum Patient with longstanding history of Crohn's disease diagnosed in 22.  Status post colectomy 1998 with ileoproctostomy and J-pouch revision. Patient being followed by Dr. Havery Moros with gastroenterology currently receiving outpatient infusions of Entyvio.  Patient noted now to be suffering from chronic pouchitis.  Patient was followed by GI during the hospitalization.  Outpatient follow-up.   4.  Chronic diarrhea Patient with history of longstanding chronic diarrhea due to chronic pouchitis and Crohn's disease.    Patient followed by GI during the hospitalization.  Worsening loose stools improved and patient's diarrhea was back to her baseline chronic diarrhea by day of discharge.  Outpatient follow-up with GI.    5.  Hypertension Patient maintained on home regimen Metroprolol.  6.  GERD Patient maintained on home regimen  famotidine.  7.  Hypoglycemia Patient noted to have a CBG of 53 (08/23/2020 ) however remained asymptomatic.  Patient received a dose of D50.    As diet was advanced blood glucose levels improved.  Patient had no further hypoglycemic spells.  Outpatient follow-up.    8.  History of gout Stable.  Allopurinol was held during the hospitalization due to acute on chronic kidney disease.  Allopurinol resumed on discharge.  9.  Hypokalemia Repleted.  Outpatient follow-up.   Procedures:  None  Consultations:  GI: Dr. Collene Mares 08/23/2020    Discharge Exam: Vitals:   08/25/20 0611 08/25/20 1325  BP: 138/80 (!) 149/94  Pulse: 94 93  Resp: 20 18  Temp: 97.9 F (36.6 C) 98.2 F (36.8 C)  SpO2: 94% 96%    General: NAD Cardiovascular: RRR Respiratory: CTAB  Discharge Instructions   Discharge Instructions    Diet - low sodium heart healthy   Complete by: As directed    Increase activity slowly   Complete by: As directed      Allergies as of 08/25/2020      Reactions   Sulfa Antibiotics Hives   Morphine Other (See Comments)   Gives me crazy dreams Gives me crazy dreams   Sulfonamide Derivatives Itching, Swelling, Rash      Medication List    STOP taking these medications   Dificid 200 MG Tabs tablet Generic drug: fidaxomicin     TAKE these medications   allopurinol 300 MG tablet Commonly known as: ZYLOPRIM Take 1 tablet (300 mg total) by mouth daily.   calcium carbonate 500 MG chewable tablet Commonly known as: TUMS - dosed in mg elemental calcium Chew 1 tablet by mouth  daily as needed for indigestion or heartburn.   DULoxetine 60 MG capsule Commonly known as: CYMBALTA Take 2 capsules (120 mg total) by mouth daily.   Entyvio 300 MG injection Generic drug: vedolizumab Inject 300 mg into the vein every 30 (thirty) days.   famotidine 20 MG tablet Commonly known as: PEPCID TAKE 1 TABLET BY MOUTH TWICE A DAY   folic acid 1 MG tablet Commonly known as:  FOLVITE Take 2 tablets (2 mg total) by mouth daily.   gabapentin 100 MG capsule Commonly known as: NEURONTIN TAKE 1 CAPSULE BY MOUTH THREE TIMES A DAY What changed: See the new instructions.   metoprolol succinate 100 MG 24 hr tablet Commonly known as: TOPROL-XL Take 50 mg by mouth 2 (two) times daily. Take 100 mg in the a.m. and 50 mg in the evening   PROBIOTIC PO Take by mouth.   simethicone 80 MG chewable tablet Commonly known as: MYLICON Chew 80 mg by mouth daily as needed [place] [employer]).   Vitamin D2 50 MCG (2000 UT) Tabs Take 2,000 Units by mouth every 30 (thirty) days.   Zinc 50 MG Tabs Take by mouth.      Allergies  Allergen Reactions  . Sulfa Antibiotics Hives  . Morphine Other (See Comments)    Gives me crazy dreams Gives me crazy dreams  . Sulfonamide Derivatives Itching, Swelling and Rash    Follow-up Information    Armbruster, Carlota Raspberry, MD. Schedule an appointment as soon as possible for a visit in 2 week(s).   Specialty: Gastroenterology Contact information: [address] [place] [address] [phone]        Donato Heinz, MD. Schedule an appointment as soon as possible for a visit in 2 week(s).   Specialty: Nephrology Contact information: [place]  [address] [phone]                The results of significant diagnostics from this hospitalization (including imaging, microbiology, ancillary and laboratory) are listed below for reference.    Significant Diagnostic Studies: US RENAL  Result Date: 08/23/2020 CLINICAL DATA:  Acute renal failure EXAM: RENAL / URINARY TRACT ULTRASOUND COMPLETE COMPARISON:  MR 06/24/2020, ultrasound 01/07/2015, CT 04/28/2011 FINDINGS: Right Kidney: Renal measurements: 9.8 x 4.5 x 4.7 cm = volume: 107.8 mL. Diffusely increased renal cortical echogenicity. No concerning renal mass, shadowing calculus or hydronephrosis. Left Kidney: Renal measurements: 9.4 x 5.2 x 5.3 cm = volume:  135.2 mL. Diffusely increased renal cortical echogenicity. No concerning renal mass, shadowing calculus or hydronephrosis. Bladder: Normal appearance for the degree of distention. Bilateral bladder jets are identified. Other: None. IMPRESSION: 1. Diffusely increased renal cortical echogenicity compatible with medical renal disease. 2. Otherwise unremarkable urinary tract ultrasound. Electronically Signed   By: Lovena Le M.D.   On: 08/23/2020 21:28    Microbiology: Recent Results (from the past 240 hour(s))  Resp Panel by RT-PCR (Flu A&B, Covid) Nasopharyngeal Swab     Status: None   Collection Time: 08/22/20  9:24 PM   Specimen: Nasopharyngeal Swab; Nasopharyngeal(NP) swabs in vial transport medium  Result Value Ref Range Status   SARS Coronavirus 2 by RT PCR NEGATIVE NEGATIVE Final    Comment: (NOTE) SARS-CoV-2 target nucleic acids are NOT DETECTED.  The SARS-CoV-2 RNA is generally detectable in upper respiratory specimens during the acute phase of infection. The lowest concentration of SARS-CoV-2 viral copies this assay can detect is 138 copies/mL. A negative result does not preclude SARS-Cov-2 infection and should not  be used as the sole basis for treatment or other patient management decisions. A negative result may occur with  improper specimen collection/handling, submission of specimen other than nasopharyngeal swab, presence of viral mutation(s) within the areas targeted by this assay, and inadequate number of viral copies(<138 copies/mL). A negative result must be combined with clinical observations, patient history, and epidemiological information. The expected result is Negative.  Fact Sheet for Patients:  EntrepreneurPulse.com.au  Fact Sheet for Healthcare Providers:  IncredibleEmployment.be  This test is no t yet approved or cleared by the [place] FDA and  has been authorized for detection and/or diagnosis of SARS-CoV-2 by FDA  under an Emergency Use Authorization (EUA). This EUA will remain  in effect (meaning this test can be used) for the duration of the COVID-19 declaration under Section 564(b)(1) of the Act, 21 U.S.[place] 360bbb-3(b)(1), unless the authorization is terminated  or revoked sooner.       Influenza A by PCR NEGATIVE NEGATIVE Final   Influenza B by PCR NEGATIVE NEGATIVE Final    Comment: (NOTE) The Xpert Xpress SARS-CoV-2/FLU/RSV plus assay is intended as an aid in the diagnosis of influenza from Nasopharyngeal swab specimens and should not be used as a sole basis for treatment. Nasal washings and aspirates are unacceptable for Xpert Xpress SARS-CoV-2/FLU/RSV testing.  Fact Sheet for Patients: EntrepreneurPulse.com.au  Fact Sheet for Healthcare Providers: IncredibleEmployment.be  This test is not yet approved or cleared by the [place] FDA and has been authorized for detection and/or diagnosis of SARS-CoV-2 by FDA under an Emergency Use Authorization (EUA). This EUA will remain in effect (meaning this test can be used) for the duration of the COVID-19 declaration under Section 564(b)(1) of the Act, 21 U.S.C. section 360bbb-3(b)(1), unless the authorization is terminated or revoked.  Performed at [facility], [place] [address] [employer], [place] [address]   Culture, blood (routine x 2)     Status: None (Preliminary result)   Collection Time: 08/22/20  9:48 PM   Specimen: BLOOD  Result Value Ref Range Status   Specimen Description   Final    BLOOD RIGHT WRIST Performed at [facility] [address] [place], [place] [address]    Special Requests   Final    BOTTLES DRAWN AEROBIC AND ANAEROBIC Blood Culture adequate volume Performed at [facility] [address] [place], [place] [address]    Culture   Final    NO GROWTH 2 DAYS Performed at [facility] [address], [place], [place] [address]    Report Status PENDING  Incomplete  Culture, blood (routine x 2)     Status: None (Preliminary result)   Collection Time: 08/22/20  9:53 PM   Specimen: BLOOD  Result Value Ref Range Status   Specimen Description   Final    BLOOD LEFT ANTECUBITAL Performed at [facility] [address] [place], [place] [address]    Special Requests   Final    BOTTLES DRAWN AEROBIC AND ANAEROBIC Blood Culture adequate volume Performed at [facility] [address] [place], [place] [address]    Culture   Final    NO GROWTH 2 DAYS Performed at [facility] [address], [place],  [address]    Report Status PENDING  Incomplete  Gastrointestinal Panel by PCR , Stool     Status: None   Collection Time: 08/23/20  2:00 AM   Specimen: STOOL  Result  Value Ref Range Status   Campylobacter species NOT DETECTED NOT DETECTED Final   Plesimonas shigelloides NOT DETECTED NOT DETECTED Final   Salmonella species NOT DETECTED NOT DETECTED Final   Yersinia enterocolitica NOT DETECTED NOT DETECTED Final   Vibrio species NOT DETECTED NOT DETECTED Final   Vibrio cholerae NOT DETECTED NOT DETECTED Final   Enteroaggregative E coli (EAEC) NOT DETECTED NOT DETECTED Final   Enteropathogenic E coli (EPEC) NOT DETECTED NOT DETECTED Final   Enterotoxigenic E coli (ETEC) NOT DETECTED NOT DETECTED Final   Shiga like toxin producing E coli (STEC) NOT DETECTED NOT DETECTED Final   Shigella/Enteroinvasive E coli (EIEC) NOT DETECTED NOT DETECTED Final   Cryptosporidium NOT DETECTED NOT DETECTED Final   Cyclospora cayetanensis NOT DETECTED NOT DETECTED Final   Entamoeba histolytica NOT DETECTED NOT DETECTED Final   Giardia lamblia NOT DETECTED NOT DETECTED Final   Adenovirus F40/41 NOT DETECTED NOT DETECTED Final   Astrovirus NOT DETECTED NOT DETECTED Final   Norovirus GI/GII NOT DETECTED NOT DETECTED Final   Rotavirus A NOT DETECTED NOT  DETECTED Final   Sapovirus (I, II, IV, and V) NOT DETECTED NOT DETECTED Final    Comment: Performed at [facility], [place] [place], [place] [address]  C [name] Quick Screen w PCR reflex     Status: None   Collection Time: 08/23/20  2:00 AM   Specimen: STOOL  Result Value Ref Range Status   C Diff antigen NEGATIVE NEGATIVE Final   C Diff toxin NEGATIVE NEGATIVE Final   C Diff interpretation No [name] detected.  Final    Comment: No [name] detected. Performed at [facility], [place] [address] [place], [place] [address]   Culture, Urine     Status: Abnormal   Collection Time: 08/23/20  1:41 PM   Specimen: Urine, Catheterized  Result Value Ref Range Status   Specimen Description   Final    URINE, CATHETERIZED Performed at [facility] [address] [place], [place] [address]    Special Requests   Final    NONE Performed at [facility], [place] [name] [place], [place] [address]    Culture (A)  Final    >=100,000 COLONIES/mL MULTIPLE SPECIES PRESENT, SUGGEST RECOLLECTION   Report Status 08/25/2020 FINAL  Final     Labs: Basic Metabolic Panel: Recent Labs  Lab 08/22/20 1452 08/22/20 2009 08/23/20 0727 08/24/20 0606 08/25/20 0519  NA 132* 132* 134* 138 143  K 4.0 4.1 3.3* 3.1* 3.0*  CL 105 106 108 101 103  CO2 14* 13* 14* 30 27  GLUCOSE 118* 96 94 95 105*  BUN 77* 78* 76* 58* 38*  CREATININE 4.85* 4.94* 4.39* 3.33* 2.46*  CALCIUM 9.6 9.2 8.5* 8.1* 8.0*  MG  --   --  1.7 1.5* 1.7  PHOS  --   --   --  3.7 2.4*   Liver Function Tests: Recent Labs  Lab 08/22/20 2009 08/23/20 0727 08/24/20 0606 08/25/20 0519  AST 19 16  --   --   ALT 22 17  --   --   ALKPHOS 108 91  --   --   BILITOT 0.3 0.7  --   --   PROT 8.4* 6.9  --   --   ALBUMIN 4.1 3.4* 3.0* 3.0*   No results for input(s): LIPASE, AMYLASE in the last 168 hours. No results for input(s): AMMONIA in the last 168  hours. CBC:  Recent Labs  Lab 08/22/20 1452 08/22/20 2009 08/23/20 0727 08/24/20 0606 08/25/20 0519  WBC 15.8* 14.5* 11.9* 7.7 6.9  NEUTROABS  --   --  6.9 3.8  --   HGB 12.1 12.5 10.5* 9.5* 9.5*  HCT 37.3 39.1 32.3* 28.9* 29.4*  MCV 102.1* 103.7* 103.2* 99.7 102.4*  PLT 458.0* 407* 327 287 278   Cardiac Enzymes: No results for input(s): CKTOTAL, CKMB, CKMBINDEX, TROPONINI in the last 168 hours. BNP: BNP (last 3 results) No results for input(s): BNP in the last 8760 hours.  ProBNP (last 3 results) No results for input(s): PROBNP in the last 8760 hours.  CBG: Recent Labs  Lab 08/24/20 1225 08/24/20 1701 08/24/20 2215 08/25/20 0752 08/25/20 1218  GLUCAP 112* 99 136* 114* 108*       Signed:  Irine Seal MD.  [facility] 08/25/2020, 3:41 PM

## 2020-08-27 ENCOUNTER — Ambulatory Visit: Payer: Medicare Other | Admitting: Physician Assistant

## 2020-08-27 ENCOUNTER — Other Ambulatory Visit: Payer: Self-pay | Admitting: Family Medicine

## 2020-08-27 DIAGNOSIS — E8881 Metabolic syndrome: Secondary | ICD-10-CM

## 2020-08-28 LAB — CULTURE, BLOOD (ROUTINE X 2)
Culture: NO GROWTH
Culture: NO GROWTH
Special Requests: ADEQUATE
Special Requests: ADEQUATE

## 2020-09-12 ENCOUNTER — Telehealth: Payer: Self-pay | Admitting: Gastroenterology

## 2020-09-12 NOTE — Telephone Encounter (Signed)
Spoke with patient, she states that she has been scheduled for a follow up appt. Pt states that she will try fiber, metamucil and probiotics to see if that helps slow down her bowels until her appt with Dr. Havery Moros. Patient had no concerns at the end of the call.

## 2020-09-12 NOTE — Telephone Encounter (Signed)
Inbound call from patient stating she is having diarrhea to the point she now has to wear diapers.  She scheduled an appointment for 09/23/20 with Dr. Havery Moros but is requesting a call from the nurse please.

## 2020-09-17 NOTE — Telephone Encounter (Signed)
Spoke with patient, she states that she was doing better after she left the hospital last year, she states that diarrhea returned last week and she began taking Metamucil about 3 days ago to see if it would help slow down her bowels, patient states that she didn't want to get dehydrated before her appointment early next week. Advised patient that if she is able to tolerate liquids by mouth, she can drink Gatorade zero, Body armour, and Pedialyte. Advised patient to call us back if anything changes before her appointment. Patient verbalized understanding and had no concerns at the end of the call.

## 2020-09-17 NOTE — Telephone Encounter (Signed)
Pt is requesting a call back from a nurse to discuss the diarrhea she is experiencing again, pt would like to know if she will need some fluids prior to seeing the provider.

## 2020-09-21 ENCOUNTER — Inpatient Hospital Stay (HOSPITAL_COMMUNITY)
Admission: EM | Admit: 2020-09-21 | Discharge: 2020-09-24 | DRG: 871 | Disposition: A | Discharge status: Home / Self Care | Payer: Medicare Other | Attending: Family Medicine | Admitting: Family Medicine

## 2020-09-21 ENCOUNTER — Other Ambulatory Visit: Payer: Self-pay

## 2020-09-21 ENCOUNTER — Emergency Department (HOSPITAL_COMMUNITY): Payer: Medicare Other

## 2020-09-21 DIAGNOSIS — F32A Depression, unspecified: Secondary | ICD-10-CM | POA: Diagnosis not present

## 2020-09-21 DIAGNOSIS — E872 Acidosis, unspecified: Secondary | ICD-10-CM | POA: Diagnosis present

## 2020-09-21 DIAGNOSIS — K91850 Pouchitis: Secondary | ICD-10-CM | POA: Diagnosis present

## 2020-09-21 DIAGNOSIS — U071 COVID-19: Secondary | ICD-10-CM | POA: Diagnosis not present

## 2020-09-21 DIAGNOSIS — N390 Urinary tract infection, site not specified: Secondary | ICD-10-CM | POA: Diagnosis not present

## 2020-09-21 DIAGNOSIS — A419 Sepsis, unspecified organism: Secondary | ICD-10-CM

## 2020-09-21 DIAGNOSIS — Z882 Allergy status to sulfonamides status: Secondary | ICD-10-CM

## 2020-09-21 DIAGNOSIS — J45909 Unspecified asthma, uncomplicated: Secondary | ICD-10-CM | POA: Diagnosis not present

## 2020-09-21 DIAGNOSIS — E860 Dehydration: Secondary | ICD-10-CM | POA: Diagnosis not present

## 2020-09-21 DIAGNOSIS — A4189 Other specified sepsis: Secondary | ICD-10-CM | POA: Diagnosis not present

## 2020-09-21 DIAGNOSIS — R531 Weakness: Secondary | ICD-10-CM | POA: Diagnosis not present

## 2020-09-21 DIAGNOSIS — A6009 Herpesviral infection of other urogenital tract: Secondary | ICD-10-CM | POA: Diagnosis present

## 2020-09-21 DIAGNOSIS — E8722 Chronic metabolic acidosis: Secondary | ICD-10-CM | POA: Diagnosis present

## 2020-09-21 DIAGNOSIS — Z885 Allergy status to narcotic agent status: Secondary | ICD-10-CM

## 2020-09-21 DIAGNOSIS — N184 Chronic kidney disease, stage 4 (severe): Secondary | ICD-10-CM | POA: Diagnosis not present

## 2020-09-21 DIAGNOSIS — M109 Gout, unspecified: Secondary | ICD-10-CM | POA: Diagnosis present

## 2020-09-21 DIAGNOSIS — Z79899 Other long term (current) drug therapy: Secondary | ICD-10-CM

## 2020-09-21 DIAGNOSIS — E785 Hyperlipidemia, unspecified: Secondary | ICD-10-CM | POA: Diagnosis not present

## 2020-09-21 DIAGNOSIS — A09 Infectious gastroenteritis and colitis, unspecified: Secondary | ICD-10-CM | POA: Diagnosis not present

## 2020-09-21 DIAGNOSIS — Z8249 Family history of ischemic heart disease and other diseases of the circulatory system: Secondary | ICD-10-CM

## 2020-09-21 DIAGNOSIS — R197 Diarrhea, unspecified: Secondary | ICD-10-CM | POA: Diagnosis not present

## 2020-09-21 DIAGNOSIS — G2581 Restless legs syndrome: Secondary | ICD-10-CM | POA: Diagnosis present

## 2020-09-21 DIAGNOSIS — R6520 Severe sepsis without septic shock: Secondary | ICD-10-CM

## 2020-09-21 DIAGNOSIS — E871 Hypo-osmolality and hyponatremia: Secondary | ICD-10-CM | POA: Diagnosis not present

## 2020-09-21 DIAGNOSIS — N179 Acute kidney failure, unspecified: Secondary | ICD-10-CM | POA: Diagnosis not present

## 2020-09-21 DIAGNOSIS — Z833 Family history of diabetes mellitus: Secondary | ICD-10-CM

## 2020-09-21 DIAGNOSIS — Z6835 Body mass index (BMI) 35.0-35.9, adult: Secondary | ICD-10-CM | POA: Diagnosis not present

## 2020-09-21 DIAGNOSIS — K2100 Gastro-esophageal reflux disease with esophagitis, without bleeding: Secondary | ICD-10-CM | POA: Diagnosis not present

## 2020-09-21 DIAGNOSIS — R6521 Severe sepsis with septic shock: Secondary | ICD-10-CM | POA: Diagnosis not present

## 2020-09-21 DIAGNOSIS — K5080 Crohn's disease of both small and large intestine without complications: Secondary | ICD-10-CM | POA: Diagnosis present

## 2020-09-21 DIAGNOSIS — I129 Hypertensive chronic kidney disease with stage 1 through stage 4 chronic kidney disease, or unspecified chronic kidney disease: Secondary | ICD-10-CM | POA: Diagnosis not present

## 2020-09-21 DIAGNOSIS — Z7984 Long term (current) use of oral hypoglycemic drugs: Secondary | ICD-10-CM

## 2020-09-21 LAB — CBC
HCT: 43.3 % (ref 36.0–46.0)
HCT: 32.1 % — ABNORMAL LOW (ref 36.0–46.0)
HCT: 21.6 % — ABNORMAL LOW (ref 36.0–46.0)
Hemoglobin: 14.2 g/dL (ref 12.0–15.0)
Hemoglobin: 10.4 g/dL — ABNORMAL LOW (ref 12.0–15.0)
Hemoglobin: 6.8 g/dL — CL (ref 12.0–15.0)
MCH: 33.9 pg (ref 26.0–34.0)
MCH: 33.7 pg (ref 26.0–34.0)
MCH: 33.5 pg (ref 26.0–34.0)
MCHC: 32.8 g/dL (ref 30.0–36.0)
MCHC: 32.4 g/dL (ref 30.0–36.0)
MCHC: 31.5 g/dL (ref 30.0–36.0)
MCV: 103.3 fL — ABNORMAL HIGH (ref 80.0–100.0)
MCV: 103.9 fL — ABNORMAL HIGH (ref 80.0–100.0)
MCV: 106.4 fL — ABNORMAL HIGH (ref 80.0–100.0)
Platelets: 451 10*3/uL — ABNORMAL HIGH (ref 150–400)
Platelets: 272 10*3/uL (ref 150–400)
Platelets: 182 10*3/uL (ref 150–400)
RBC: 4.19 MIL/uL (ref 3.87–5.11)
RBC: 3.09 MIL/uL — ABNORMAL LOW (ref 3.87–5.11)
RBC: 2.03 MIL/uL — ABNORMAL LOW (ref 3.87–5.11)
RDW: 15.1 % (ref 11.5–15.5)
RDW: 15 % (ref 11.5–15.5)
RDW: 14.9 % (ref 11.5–15.5)
WBC: 15 10*3/uL — ABNORMAL HIGH (ref 4.0–10.5)
WBC: 12.6 10*3/uL — ABNORMAL HIGH (ref 4.0–10.5)
WBC: 8.3 10*3/uL (ref 4.0–10.5)
nRBC: 0.1 % (ref 0.0–0.2)
nRBC: 0 % (ref 0.0–0.2)
nRBC: 0 % (ref 0.0–0.2)

## 2020-09-21 LAB — C DIFFICILE QUICK SCREEN W PCR REFLEX
C Diff antigen: NEGATIVE
C Diff interpretation: NOT DETECTED
C Diff toxin: NEGATIVE

## 2020-09-21 LAB — URINALYSIS, ROUTINE W REFLEX MICROSCOPIC
Bilirubin Urine: NEGATIVE
Glucose, UA: NEGATIVE mg/dL
Ketones, ur: NEGATIVE mg/dL
Nitrite: NEGATIVE
Protein, ur: 30 mg/dL — AB
Specific Gravity, Urine: 1.009 (ref 1.005–1.030)
pH: 6 (ref 5.0–8.0)

## 2020-09-21 LAB — COMPREHENSIVE METABOLIC PANEL
ALT: 22 U/L (ref 0–44)
AST: 27 U/L (ref 15–41)
Albumin: 4.7 g/dL (ref 3.5–5.0)
Alkaline Phosphatase: 101 U/L (ref 38–126)
Anion gap: 23 — ABNORMAL HIGH (ref 5–15)
BUN: 77 mg/dL — ABNORMAL HIGH (ref 8–23)
CO2: 16 mmol/L — ABNORMAL LOW (ref 22–32)
Calcium: 10.4 mg/dL — ABNORMAL HIGH (ref 8.9–10.3)
Chloride: 91 mmol/L — ABNORMAL LOW (ref 98–111)
Creatinine, Ser: 6.61 mg/dL — ABNORMAL HIGH (ref 0.44–1.00)
GFR, Estimated: 6 mL/min — ABNORMAL LOW (ref >60)
Glucose, Bld: 165 mg/dL — ABNORMAL HIGH (ref 70–99)
Potassium: 3.8 mmol/L (ref 3.5–5.1)
Sodium: 130 mmol/L — ABNORMAL LOW (ref 135–145)
Total Bilirubin: 0.5 mg/dL (ref 0.3–1.2)
Total Protein: 9.9 g/dL — ABNORMAL HIGH (ref 6.5–8.1)

## 2020-09-21 LAB — CREATININE, SERUM
Creatinine, Ser: 5.51 mg/dL — ABNORMAL HIGH (ref 0.44–1.00)
GFR, Estimated: 8 mL/min — ABNORMAL LOW (ref >60)

## 2020-09-21 LAB — BASIC METABOLIC PANEL
Anion gap: 16 — ABNORMAL HIGH (ref 5–15)
BUN: 45 mg/dL — ABNORMAL HIGH (ref 8–23)
CO2: 10 mmol/L — ABNORMAL LOW (ref 22–32)
Calcium: 7.4 mg/dL — ABNORMAL LOW (ref 8.9–10.3)
Chloride: 105 mmol/L (ref 98–111)
Creatinine, Ser: 2.87 mg/dL — ABNORMAL HIGH (ref 0.44–1.00)
GFR, Estimated: 18 mL/min — ABNORMAL LOW (ref >60)
Glucose, Bld: 68 mg/dL — ABNORMAL LOW (ref 70–99)
Potassium: 3.7 mmol/L (ref 3.5–5.1)
Sodium: 131 mmol/L — ABNORMAL LOW (ref 135–145)

## 2020-09-21 LAB — OSMOLALITY, URINE: Osmolality, Ur: 265 mOsm/kg — ABNORMAL LOW (ref 300–900)

## 2020-09-21 LAB — PHOSPHORUS: Phosphorus: 5.9 mg/dL — ABNORMAL HIGH (ref 2.5–4.6)

## 2020-09-21 LAB — HEMOGLOBIN AND HEMATOCRIT, BLOOD
HCT: 32.1 % — ABNORMAL LOW (ref 36.0–46.0)
Hemoglobin: 10.4 g/dL — ABNORMAL LOW (ref 12.0–15.0)

## 2020-09-21 LAB — SARS CORONAVIRUS 2 (TAT 6-24 HRS): SARS Coronavirus 2: POSITIVE — AB

## 2020-09-21 LAB — SODIUM, URINE, RANDOM: Sodium, Ur: 50 mmol/L

## 2020-09-21 LAB — APTT: aPTT: 26 seconds (ref 24–36)

## 2020-09-21 LAB — LACTIC ACID, PLASMA
Lactic Acid, Venous: 2.3 mmol/L (ref 0.5–1.9)
Lactic Acid, Venous: 1.6 mmol/L (ref 0.5–1.9)

## 2020-09-21 LAB — PREPARE RBC (CROSSMATCH)

## 2020-09-21 LAB — PROTIME-INR
INR: 1 (ref 0.8–1.2)
Prothrombin Time: 13.2 seconds (ref 11.4–15.2)

## 2020-09-21 LAB — MAGNESIUM: Magnesium: 1.5 mg/dL — ABNORMAL LOW (ref 1.7–2.4)

## 2020-09-21 IMAGING — DX DG CHEST 1V PORT
1 series · 1 of 1 positions shown · non-contrast
Comparison: [DATE]

CLINICAL DATA: Weakness.

EXAM:
PORTABLE CHEST 1 VIEW

[chest ap]
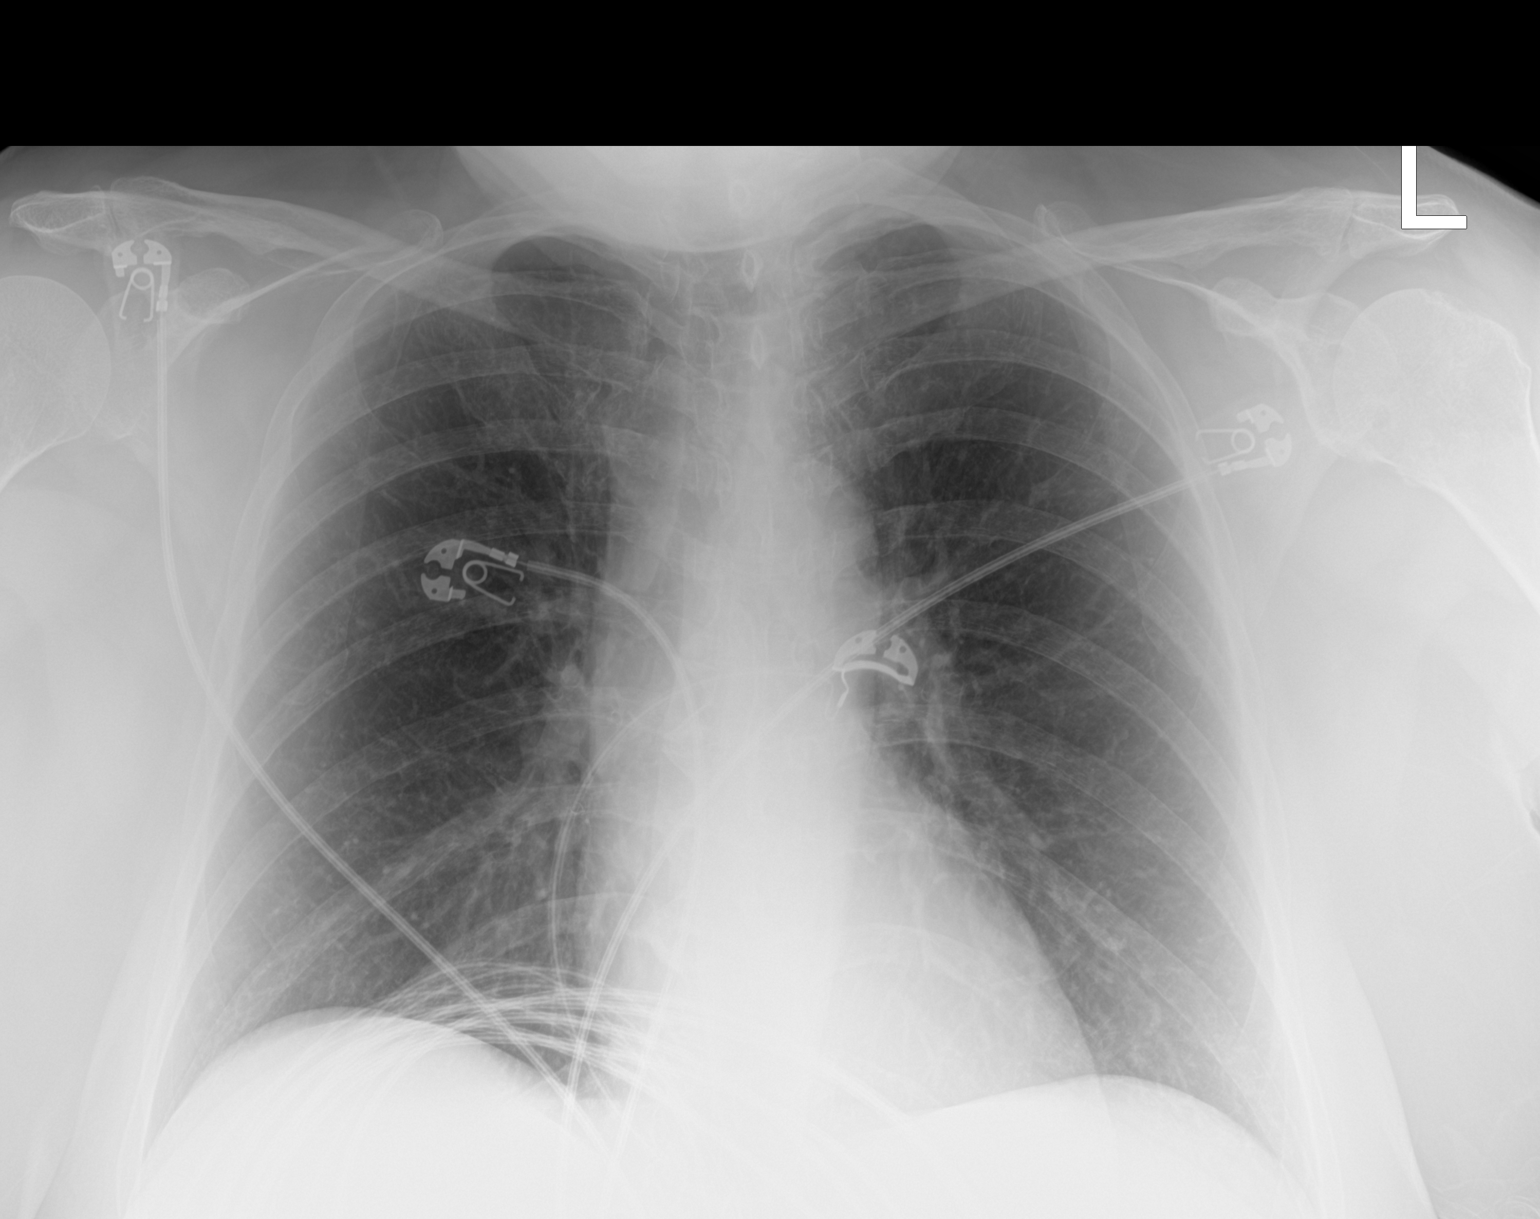

[1 of 1 positions shown; findings below may reference images not displayed]

FINDINGS: The heart size and mediastinal contours are within normal limits.
Both lungs are clear. The visualized skeletal structures are
unremarkable.
IMPRESSION: No active disease.

## 2020-09-21 MED ORDER — ACETAMINOPHEN 650 MG RE SUPP: 650.0000 mg | Freq: Four times a day (QID) | RECTAL | Status: DC | PRN

## 2020-09-21 MED ORDER — SODIUM CHLORIDE 0.9 % IV SOLN
100.0000 mg | Freq: Every day | INTRAVENOUS | Status: DC
  Administered 2020-09-22 – 2020-09-24 (×3): 100 mg via INTRAVENOUS
  Filled 2020-09-21 (×4): qty 20

## 2020-09-21 MED ORDER — SODIUM CHLORIDE 0.9 % IV SOLN
2.0000 g | Freq: Once | INTRAVENOUS | Status: AC
  Administered 2020-09-21: 2 g via INTRAVENOUS
  Filled 2020-09-21: qty 20

## 2020-09-21 MED ORDER — LACTATED RINGERS IV BOLUS (SEPSIS)
1000.0000 mL | Freq: Once | INTRAVENOUS | Status: AC
  Administered 2020-09-21: 1000 mL via INTRAVENOUS

## 2020-09-21 MED ORDER — METRONIDAZOLE IN NACL 5-0.79 MG/ML-% IV SOLN
500.0000 mg | Freq: Once | INTRAVENOUS | Status: AC
  Administered 2020-09-21: 500 mg via INTRAVENOUS
  Filled 2020-09-21: qty 100

## 2020-09-21 MED ORDER — GABAPENTIN 100 MG PO CAPS
100.0000 mg | ORAL_CAPSULE | Freq: Every day | ORAL | Status: DC
Start: 2020-09-21 — End: 2020-09-23
  Administered 2020-09-21 – 2020-09-22 (×2): 100 mg via ORAL
  Filled 2020-09-21 (×2): qty 1

## 2020-09-21 MED ORDER — SODIUM CHLORIDE 0.9 % IV BOLUS
1000.0000 mL | Freq: Once | INTRAVENOUS | Status: AC
  Administered 2020-09-21: 1000 mL via INTRAVENOUS

## 2020-09-21 MED ORDER — LACTATED RINGERS IV BOLUS (SEPSIS)
1000.0000 mL | Freq: Once | INTRAVENOUS | Status: AC
Start: 2020-09-21 — End: 2020-09-21
  Administered 2020-09-21: 1000 mL via INTRAVENOUS

## 2020-09-21 MED ORDER — ONDANSETRON HCL 4 MG/2ML IJ SOLN: 4.0000 mg | Freq: Four times a day (QID) | INTRAMUSCULAR | Status: DC | PRN

## 2020-09-21 MED ORDER — SODIUM BICARBONATE 650 MG PO TABS
1300.0000 mg | ORAL_TABLET | Freq: Three times a day (TID) | ORAL | Status: DC
  Administered 2020-09-21 – 2020-09-24 (×10): 1300 mg via ORAL
  Filled 2020-09-21 (×10): qty 2

## 2020-09-21 MED ORDER — FAMOTIDINE 20 MG PO TABS
10.0000 mg | ORAL_TABLET | Freq: Every day | ORAL | Status: DC
Start: 2020-09-21 — End: 2020-09-24
  Administered 2020-09-21 – 2020-09-24 (×4): 10 mg via ORAL
  Filled 2020-09-21 (×4): qty 1

## 2020-09-21 MED ORDER — LACTATED RINGERS IV SOLN: INTRAVENOUS | Status: AC

## 2020-09-21 MED ORDER — SODIUM CHLORIDE 0.9% IV SOLUTION: Freq: Once | INTRAVENOUS | Status: DC

## 2020-09-21 MED ORDER — HEPARIN SODIUM (PORCINE) 5000 UNIT/ML IJ SOLN
5000.0000 [IU] | Freq: Three times a day (TID) | INTRAMUSCULAR | Status: DC
  Administered 2020-09-21 – 2020-09-24 (×7): 5000 [IU] via SUBCUTANEOUS
  Filled 2020-09-21 (×8): qty 1

## 2020-09-21 MED ORDER — VITAMIN D3 25 MCG (1000 UNIT) PO TABS
1000.0000 [IU] | ORAL_TABLET | Freq: Every day | ORAL | Status: DC
  Administered 2020-09-22 – 2020-09-24 (×3): 1000 [IU] via ORAL
  Filled 2020-09-21 (×4): qty 1

## 2020-09-21 MED ORDER — ENOXAPARIN SODIUM 30 MG/0.3ML ~~LOC~~ SOLN: 30.0000 mg | SUBCUTANEOUS | Status: DC

## 2020-09-21 MED ORDER — FOLIC ACID 1 MG PO TABS
1.0000 mg | ORAL_TABLET | Freq: Every day | ORAL | Status: DC
  Administered 2020-09-21 – 2020-09-24 (×4): 1 mg via ORAL
  Filled 2020-09-21 (×4): qty 1

## 2020-09-21 MED ORDER — DULOXETINE HCL 60 MG PO CPEP
120.0000 mg | ORAL_CAPSULE | Freq: Every day | ORAL | Status: DC
  Administered 2020-09-21 – 2020-09-24 (×4): 120 mg via ORAL
  Filled 2020-09-21: qty 2
  Filled 2020-09-21: qty 4
  Filled 2020-09-21 (×2): qty 2

## 2020-09-21 MED ORDER — ONDANSETRON HCL 4 MG PO TABS: 4.0000 mg | ORAL_TABLET | Freq: Four times a day (QID) | ORAL | Status: DC | PRN

## 2020-09-21 MED ORDER — FAMOTIDINE 20 MG PO TABS: 20.0000 mg | ORAL_TABLET | Freq: Two times a day (BID) | ORAL | Status: DC

## 2020-09-21 MED ORDER — SODIUM CHLORIDE 0.9 % IV SOLN
100.0000 mg | Freq: Once | INTRAVENOUS | Status: AC
  Administered 2020-09-22: 100 mg via INTRAVENOUS
  Filled 2020-09-21: qty 20

## 2020-09-21 MED ORDER — ACETAMINOPHEN 325 MG PO TABS
650.0000 mg | ORAL_TABLET | Freq: Four times a day (QID) | ORAL | Status: DC | PRN
  Administered 2020-09-23: 650 mg via ORAL
  Filled 2020-09-21: qty 2

## 2020-09-21 NOTE — H&P (Signed)
History and Physical  Patient Name: [name]     GYK:[id number]    DOB: 23-Apr-1956    DOA: 09/21/2020 PCP: Ann Held, DO  Patient coming from: Home  Chief Complaint: Diarrhea    HPI: [name] is a 65 y.o. female with hx of Crohn's disease (Dx 1990) status post colectomy with ileoproctostomy and J-pouch revision in 1998 now with subsequent chronic pouchitis, chronic kidney disease stage IV, restless leg syndrome, gout, gastroesophageal reflux disease who presented to the hospital on 09/21/2020 secondary to diarrhea.  Patient states for the past few days she has had persistent diarrhea, general malaise, dizziness, diminished urinary output and poor p.o. intake.  She has tried to be hydrated however been difficult due to the persistent diarrhea.  She denies any fever, shortness of breath, chest pain, no abdominal pain.  Patient has had issues with her bowels in the past, and she has a history of Crohn's and recurrent C. difficile infections. Of note, patient had a similar presentation, when she was hospitalized from 12/17-12/20/21 secondary C. difficile colitis, hypovolemia, and AKI.  She completed course of Dificid and her diarrhea improved.  However again she had persistent diarrhea for the past 3 to 4 days.  Denies any recent antibiotic use. Her GI specialist is Dr. Havery Moros.  Patient has not been vaccinated for COVID-19.   ED course: -Vitals in the ED, temperature 94.2 F, blood pressure 103/64, heart rate 106, maintaining sats on room air. -Relevant labs on initial presentation: Sodium 130, potassium 3.8, bicarb 16, chloride 91, glucose under 65, BUN 77, creatinine 6.6, calcium 10.4, albumin 4.7, WBC 15, hemoglobin 14.2 -Sepsis protocol initiated in the ED, given septic bolus, Rocephin and Flagyl. -Hospitalist service was contacted for further evaluation and management.     ROS: A complete and thorough 12 point review of systems obtained, negative unless stated in  HPI.      Past Medical History:  Diagnosis Date  . Abdominal pain    Hx  . Allergy   . Anal stenosis   . Anemia   . Anxiety   . Arthritis   . Asthma    patient does not have inhaler  . Blood in stool    Hx  . Blood in urine    Hx  . Blood transfusion without reported diagnosis   . Cataract   . CKD (chronic kidney disease) stage 3, GFR 30-59 ml/min (HCC)   . Crohn's colitis, other complication ([place])   . De Quervain's tenosynovitis   . Depression   . Difficulty urinating    Hx  . Easy bruising   . Esophagitis   . Fainting    History - resolved - due to dehydration  . Fatigue    Hx  . Fibroid    Hx  . Gastric polyp   . GERD (gastroesophageal reflux disease)   . 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 Hemorrhoids, external   . Hemorrhoids, internal   . Herpes, genital    vaginal treated 07/05/12 and pt states is resolved  . History of cervical dysplasia   . History of small bowel obstruction   . Hyperlipidemia    currently no meds  . Hyperparathyroidism   . Hypertension   . Hypokalemia    Hx  . 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  Obesity   . Ovarian cyst   . Poor dental hygiene   . Pulmonary nodule, right    right upper lobe  . Rectal bleeding    Hx  . Rectal pain    Hx  . Renal insufficiency    CKD - stage 3  . RLS (restless legs syndrome)    no meds  . Tooth infection 11/2016   right low  . Ulcerative colitis   . Visual disturbance    wears glasses  . Weakness generalized    Hx - patient denies generalized weakness  . Wears dentures    upper only    Past Surgical History:  Procedure Laterality Date  . ANAL DILATION    . CERVICAL BIOPSY  W/ LOOP ELECTRODE EXCISION    . CHOLECYSTECTOMY    . COLONOSCOPY     [name]  . fatty tumor removed from back     X 2  . HEMORRHOID SURGERY    . ILEOSTOMY CLOSURE    . RESTORATIVE  PROCTOCOLECTOMY     with insertion of ileoanal J Pouch with loop ileostomy  . SHOULDER ARTHROSCOPY WITH CAPSULORRHAPHY Left 06/14/2019   Procedure: LEFT SHOULDER ARTHRSCOPIC REPAIR OF BONY BANKART FRACTURE;  Surgeon: Tania Ade, MD;  Location: [facility];  Service: Orthopedics;  Laterality: Left;  REQUEST 90 MINUTE  . SIGMOIDOSCOPY    . TOTAL ABDOMINAL HYSTERECTOMY  1998   TAH/LSO  . TUBAL LIGATION    . UPPER GASTROINTESTINAL ENDOSCOPY     Olevia Perches    Social History: Patient lives at home.  The patient walks without assistance.    Allergies  Allergen Reactions  . Sulfa Antibiotics Hives  . Morphine Other (See Comments)    Gives me crazy dreams Gives me crazy dreams  . Sulfonamide Derivatives Itching, Swelling and Rash    Family history: family history includes Cancer in her maternal uncle and sister; Diabetes in her sister; Heart attack in her father; Heart disease in her mother; Hypertension in her father and mother; Irritable bowel syndrome in an other family member; Ulcerative colitis in her daughter and father.  Prior to Admission medications   Medication Sig Start Date End Date Taking? Authorizing Provider  allopurinol (ZYLOPRIM) 300 MG tablet TAKE 1 TABLET BY MOUTH EVERY DAY 08/27/20   Carollee Herter, Alferd Apa, DO  calcium carbonate (TUMS - DOSED IN MG ELEMENTAL CALCIUM) 500 MG chewable tablet Chew 1 tablet by mouth daily as needed for indigestion or heartburn.    [provider]  DULoxetine (CYMBALTA) 60 MG capsule Take 2 capsules (120 mg total) by mouth daily. 06/05/20   Ann Held, DO  Ergocalciferol (VITAMIN D2) 2000 units TABS Take 2,000 Units by mouth every 30 (thirty) days.     [provider]  famotidine (PEPCID) 20 MG tablet TAKE 1 TABLET BY MOUTH TWICE A DAY 07/01/20   Armbruster, Carlota Raspberry, MD  folic acid (FOLVITE) 1 MG tablet Take 2 tablets (2 mg total) by mouth daily. 05/26/17   [name], MD  gabapentin (NEURONTIN) 100 MG capsule TAKE  1 CAPSULE BY MOUTH THREE TIMES A DAY Patient taking differently: Take 100 mg by mouth 2 (two) times daily. 05/01/20   Roma Schanz R, DO  metFORMIN (GLUCOPHAGE-XR) 500 MG 24 hr tablet TAKE 1 TABLET BY MOUTH EVERY DAY WITH BREAKFAST 08/27/20   Carollee Herter, Alferd Apa, DO  metoprolol succinate (TOPROL-XL) 100 MG 24 hr tablet Take 50 mg by mouth 2 (two) times daily. Take  100 mg in the a.m. and 50 mg in the evening    [provider]  Probiotic Product (PROBIOTIC PO) Take by mouth.    [provider]  simethicone (MYLICON) 80 MG chewable tablet Chew 80 mg by mouth daily as needed [place] [employer]).     [provider]  vedolizumab (ENTYVIO) 300 MG injection Inject 300 mg into the vein every 30 (thirty) days.    [provider]  Zinc 50 MG TABS Take by mouth.    [provider]       Physical Exam: BP (!) 160/93   Pulse (!) 102   Temp (!) 97.3 F (36.3 C) (Oral) Comment: Junior's presence brought her warmth  Simultaneous filing. User may not have seen previous data. Comment (Src): Simultaneous filing. User may not have seen previous data.  Resp 11   Ht 5' 3"  (1.6 m)   Wt 88.5 kg   SpO2 98%   BMI 34.54 kg/m  General appearance: Well-developed, adult female, alert and in no acute distress .   Eyes: Anicteric, conjunctiva pink, lids and lashes normal. PERRL.    ENT: No nasal deformity, discharge, epistaxis.  Hearing intact. OP moist without lesions.   Neck: No neck masses.  Trachea midline.  No thyromegaly/tenderness. Lymph: No cervical or supraclavicular lymphadenopathy. Skin: Warm and dry.  No jaundice.  No suspicious rashes or lesions. Cardiac: RRR, nl S1-S2, no murmurs appreciated.   No LE edema.  Radial and pedal pulses 2+ and symmetric. Respiratory: Normal respiratory rate and rhythm.  CTAB without rales or wheezes. Abdomen: Abdomen soft.  Nontender to palpation. No ascites, distension, hepatosplenomegaly.   MSK: No deformities or effusions of the  large joints of the upper or lower extremities bilaterally.  Neuro: Cranial nerves 2 through 12 grossly intact.  Sensation intact to light touch. Speech is fluent.  Muscle strength.    Psych: Sensorium intact and responding to questions, attention normal.  Behavior appropriate.  Judgment and insight appear normal.     Labs on Admission:  I have personally reviewed following labs and imaging studies: CBC: Recent Labs  Lab 09/21/20 0400  WBC 15.0*  HGB 14.2  HCT 43.3  MCV 103.3*  PLT 782*   Basic Metabolic Panel: Recent Labs  Lab 09/21/20 0400  NA 130*  K 3.8  CL 91*  CO2 16*  GLUCOSE 165*  BUN 77*  CREATININE 6.61*  CALCIUM 10.4*   GFR: Estimated Creatinine Clearance: 8.8 mL/min (A) (by C-G formula based on SCr of 6.61 mg/dL (H)).  Liver Function Tests: Recent Labs  Lab 09/21/20 0400  AST 27  ALT 22  ALKPHOS 101  BILITOT 0.5  PROT 9.9*  ALBUMIN 4.7   No results for input(s): LIPASE, AMYLASE in the last 168 hours. No results for input(s): AMMONIA in the last 168 hours. Coagulation Profile: Recent Labs  Lab 09/21/20 0400  INR 1.0   Cardiac Enzymes: No results for input(s): CKTOTAL, CKMB, CKMBINDEX, TROPONINI in the last 168 hours. BNP (last 3 results) No results for input(s): PROBNP in the last 8760 hours. HbA1C: No results for input(s): HGBA1C in the last 72 hours. CBG: No results for input(s): GLUCAP in the last 168 hours. Lipid Profile: No results for input(s): CHOL, HDL, LDLCALC, TRIG, CHOLHDL, LDLDIRECT in the last 72 hours. Thyroid Function Tests: No results for input(s): TSH, T4TOTAL, FREET4, T3FREE, THYROIDAB in the last 72 hours. Anemia Panel: No results for input(s): VITAMINB12, FOLATE, FERRITIN, TIBC, IRON, RETICCTPCT in the last 72 hours.  Recent Results (from the past 240 hour(s))  Culture, blood (single)     Status: None (Preliminary result)   Collection Time: 09/21/20  4:00 AM   Specimen: BLOOD  Result Value Ref Range Status    Specimen Description   Final    BLOOD LEFT ANTECUBITAL Performed at [facility], [address] [place], [place] [address]    Special Requests   Final    BOTTLES DRAWN AEROBIC AND ANAEROBIC Blood Culture adequate volume Performed at [facility] [address] [place], [place] [address]    Culture PENDING  Incomplete   Report Status PENDING  Incomplete           Radiological Exams on Admission: Personally reviewed: No signs of active disease on chest x-ray DG Chest Port 1 View  Result Date: 09/21/2020 CLINICAL DATA:  Weakness. EXAM: PORTABLE CHEST 1 VIEW COMPARISON:  Jan 14, 2017 FINDINGS: The heart size and mediastinal contours are within normal limits. Both lungs are clear. The visualized skeletal structures are unremarkable. IMPRESSION: No active disease. Electronically Signed   By: Constance Holster M.D.   On: 09/21/2020 05:59      Assessment/Plan   1.  Acute diarrhea, present on admission - History of multiple C. difficile infections and Crohn's disease - C. difficile testing ordered - Contact precautions - Diet as tolerated - IV fluids - Follow-up blood cultures - We will hold off on further antibiotics until C. difficile testing.  Believe her hypotension and tachycardic related to hypovolemia from the diarrhea. -Will defer GI consult to rounding team  2.  Acute renal failure secondary to hypovolemia with baseline CKD 4 - Previous baseline creatinine 2.2 - On admission creatinine - Urinalysis pending - Strict I's and O's - Continue IV fluids - No acute need for renal replacement therapy at this time - We will hold off on nephrology consult at this time - Follow-up labs ordered  3.  Crohn's disease of the jejunum -Longstanding known history of Crohn's disease diagnosed approximately 41 -Patient is status post colectomy 1998 with ileoproctostomy and J-pouch revision -Patient follows with Dr. Havery Moros with the [facility]  gastroenterology  -Patient currently receives outpatient infusions of Entyvio - see plans above   4.  Anion gap metabolic acidosis - Likely secondary to diarrhea and AKI - On admission bicarb 16 - Continue IV fluids - Placed on sodium bicarb tabs -Follow-up labs ordered  5.  Essential hypertension - Will hold home regiment at this time given soft BPs on initial presentation - Monitor vitals closely  6.  Hypercalcemia - Likely secondary to hypovolemia, volume contraction - IV fluids - Follow-up labs ordered    DVT prophylaxis: Heparin Code Status: Full Family Communication: None Disposition Plan: Anticipate home once stable Consults called: None Admission status: Inpatient     Medical decision making: Patient seen at 9:44 AM on 09/21/2020.  What exists of the patient's chart was reviewed in depth and summarized above.  Clinical condition: Fair.        Doran Heater [facility] Please page though [place] or Epic secure chat:  For password, contact charge nurse

## 2020-09-21 NOTE — ED Provider Notes (Signed)
Attestation: Medical screening examination/treatment/procedure(s) were conducted as a shared visit with non-physician practitioner(s) and myself.  I personally evaluated the patient during the encounter.   Briefly, the patient is a 65 y.o. female with h/o Crohn's disease status post colectomy with internal J-pouch, CKD, GERD, gout, restless leg syndrome recurrent C. difficile infection presenting complaining of feeling dehydrated.  Patient report for the past 3 to 4 days she has had persistent diarrhea, decrease in appetite, generalized weakness, and lightheadedness  Vitals:   09/21/20 1430 09/21/20 1700  BP: (!) 167/93 (!) 166/93  Pulse: (!) 103 (!) 111  Resp: 17 16  Temp:    SpO2: 97% 99%    CONSTITUTIONAL: Ill but nontoxic-appearing, NAD NEURO:  Alert and oriented x 3, no focal deficits EYES:  pupils equal and reactive ENT/NECK:  trachea midline, no JVD CARDIO: Tacky rate, regular rhythm, well-perfused PULM: None labored breathing GI/GU:  Abdomen non-distended or mild discomfort to palpation MSK/SPINE:  No gross deformities, no edema PSYCH:  Appropriate speech and behavior   EKG Interpretation  Date/Time:  Sunday September 21 2020 05:21:21 EST Ventricular Rate:  93 PR Interval:    QRS Duration: 119 QT Interval:  407 QTC Calculation: 507 R Axis:   -54 Text Interpretation: Sinus rhythm Probable left atrial enlargement Left anterior fascicular block Left ventricular hypertrophy No acute changes Confirmed by Addison Lank [phone]) on 09/21/2020 5:45:21 AM       Work up concerning for sepsis.  Given her history of recurrent C. difficile, this is high likelihood.  Also noted to have a urinary tract infection. She was started on empiric antibiotics and provided with IV fluids. Patient also noted to have severe AKI.  Admitted to medicine for further work-up and management.  Marland Kitchen1-3 Lead EKG Interpretation Performed by: Fatima Blank, MD Authorized by: Fatima Blank,  MD     Interpretation: normal     ECG rate:  104   ECG rate assessment: tachycardic     Rhythm: sinus rhythm     Ectopy: none     Conduction: normal   .Critical Care Performed by: Fatima Blank, MD Authorized by: Fatima Blank, MD     CRITICAL CARE Performed by: Grayce Sessions Patsy Zaragoza Total critical care time: 15 minutes Critical care time was exclusive of separately billable procedures and treating other patients. Critical care was necessary to treat or prevent imminent or life-threatening deterioration. Critical care was time spent personally by me on the following activities: development of treatment plan with patient and/or surrogate as well as nursing, discussions with consultants, evaluation of patient's response to treatment, examination of patient, obtaining history from patient or surrogate, ordering and performing treatments and interventions, ordering and review of laboratory studies, ordering and review of radiographic studies, pulse oximetry and re-evaluation of patient's condition.       Fatima Blank, MD 09/21/20 1758

## 2020-09-21 NOTE — ED Notes (Signed)
Attempted to call report. Floor states the bed has not been approved, unable to get report.

## 2020-09-21 NOTE — ED Notes (Signed)
Unable to obtain accurate temp reading. Attempted axillary and oral with two thermometers and both reading 94 F. Pt radial pulse weak and thready. Pt does not have a rectum

## 2020-09-21 NOTE — ED Triage Notes (Signed)
Pt came in with c/o diarrhea and feeling weak times 3-4 days. Pt was diagnosed with C. Diff approx two weeks ago. Pt states she has not been eating adequately.

## 2020-09-21 NOTE — ED Notes (Signed)
Still unable to give report at this time

## 2020-09-21 NOTE — ED Provider Notes (Signed)
[facility] DEPT Provider Note   CSN: [id number] Arrival date & time: 09/21/20  0334     History Chief Complaint  Patient presents with  . Diarrhea  . Weakness    [name] is a 65 y.o. female.  The history is provided by the patient and medical records. No language interpreter was used.  Diarrhea Weakness Associated symptoms: diarrhea      65 year old female significant history of Crohn's disease status post colectomy with internal J-pouch, CKD, GERD, gout, restless leg syndrome recurrent C. difficile infection presenting complaining of feeling dehydrated.  Patient report for the past 3 to 4 days she has had persistent diarrhea, decrease in appetite, generalized weakness, and lightheadedness.  She feels as if any amount of fluid that she drinks will go right through her.  She does not complain of fever chills no chest pain or shortness of breath no runny nose sneezing or coughing no urinary discomfort.  She has been having complications with recurrent C. difficile requiring different types of antibiotics.  She denies being on any active antibiotic at this time.  She previously was treated with Dificid for recurrent C. difficile.  Her GI specialist is Dr. Havery Moros.  Patient has not been vaccinated for COVID-19.  Past Medical History:  Diagnosis Date  . Abdominal pain    Hx  . Allergy   . Anal stenosis   . Anemia   . Anxiety   . Arthritis   . Asthma    patient does not have inhaler  . Blood in stool    Hx  . Blood in urine    Hx  . Blood transfusion without reported diagnosis   . Cataract   . CKD (chronic kidney disease) stage 3, GFR 30-59 ml/min (HCC)   . Crohn's colitis, other complication ([place])   . De Quervain's tenosynovitis   . Depression   . Difficulty urinating    Hx  . Easy bruising   . Esophagitis   . Fainting    History - resolved - due to dehydration  . Fatigue    Hx  . Fibroid    Hx  . Gastric polyp   . GERD  (gastroesophageal reflux disease)   . 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 Hemorrhoids, external   . Hemorrhoids, internal   . Herpes, genital    vaginal treated 07/05/12 and pt states is resolved  . History of cervical dysplasia   . History of small bowel obstruction   . Hyperlipidemia    currently no meds  . Hyperparathyroidism   . Hypertension   . Hypokalemia    Hx  . 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 Obesity   . Ovarian cyst   . Poor dental hygiene   . Pulmonary nodule, right    right upper lobe  . Rectal bleeding    Hx  . Rectal pain    Hx  . Renal insufficiency    CKD - stage 3  . RLS (restless legs syndrome)    no meds  . Tooth infection 11/2016   right low  . Ulcerative colitis   . Visual disturbance    wears glasses  . Weakness generalized    Hx - patient denies generalized weakness  . Wears dentures    upper only    Patient Active Problem  List   Diagnosis Date Noted  . Crohn's disease of jejunum ([place]) 08/23/2020  . Chronic diarrhea 08/23/2020  . Metabolic acidosis 95/05/3266  . ARF (acute renal failure) ([place]) 08/23/2020  . Acute renal failure superimposed on stage 4 chronic kidney disease ([place]) 08/22/2020  . Hyperglycemia 02/26/2020  . Immunologic deficiency syndrome ([place]) 02/26/2020  . Prothrombin gene mutation ([place]) 02/26/2020  . Acute pain of left shoulder 06/04/2019  . Pes anserine bursitis 04/12/2018  . Pain of right thumb 04/12/2018  . Low back pain 06/21/2017  . Acute right-sided low back pain with right-sided sciatica 06/21/2017  . Gout 06/21/2017  . Superficial thrombophlebitis of right upper extremity 03/23/2017  . Acute basilic vein thrombosis, right 02/16/2017  . Symptomatic anemia 01/15/2017  . 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 Rectal pain 12/01/2015  . IBD (inflammatory bowel disease) 10/24/2015  . Anal stricture  10/24/2015  . Gout of foot 10/29/2014  . Ovarian cyst, right 06/12/2014  . Acute on chronic renal failure ([place]) 04/02/2013  . Hydrosalpinx 02/14/2013  . Hyperparathyroidism ([place]) 09/18/2012  . HSV (herpes simplex virus) anogenital infection 06/28/2012  . CKD (chronic kidney disease), stage IV ([place]) 06/17/2012  . LLQ abdominal pain 06/22/2011  . RESTLESS LEGS SYNDROME 08/05/2009  . PULMONARY NODULE, RIGHT UPPER LOBE 02/28/2009  . TOBACCO ABUSE, HX OF 02/11/2009  . PAIN IN JOINT, MULTIPLE SITES 02/04/2009  . Nonspecific (abnormal) findings on radiological and other examination of body structure 12/23/2008  . CT, CHEST, ABNORMAL 12/23/2008  . HEMANGIOMA SIMPLEX 12/16/2008  . Pain in joint, pelvic region and thigh 12/16/2008  . SKIN RASH 12/16/2008  . TINEA CORPORIS 04/18/2008  . LATERAL EPICONDYLITIS, RIGHT 04/18/2008  . Morbid obesity ([place]) 02/05/2008  . ANEMIA 02/05/2008  . ANAL STENOSIS 02/05/2008  . Stool culture positive for Clostridioides difficile 02/05/2008  . Personal history of other endocrine, metabolic, and immunity disorders 02/05/2008  . Personal history of other diseases of digestive system 02/05/2008  . SYNCOPE AND COLLAPSE 02/02/2008  . HYPONATREMIA 12/15/2007  . DEHYDRATION 12/15/2007  . HYPOKALEMIA 12/15/2007  . ANEMIA-IRON DEFICIENCY 12/15/2007  . LEUKOCYTOSIS 12/15/2007  . Anxiety state 12/15/2007  . RENAL INSUFFICIENCY 11/16/2007  . TINNITUS NOS 05/25/2007  . DE QUERVAIN'S TENOSYNOVITIS 05/25/2007  . Hyperlipidemia LDL goal <100 10/23/2006  . Depression 10/23/2006  . Essential hypertension 10/23/2006  . GERD 10/23/2006  . Gastro-esophageal reflux disease without esophagitis 10/23/2006  . Ulcerative colitis without complications ([place]) 12/45/8099  . GASTRIC POLYP 11/16/2001  . GERD with esophagitis 11/16/2001    Past Surgical History:  Procedure Laterality Date  . ANAL DILATION    . CERVICAL BIOPSY  W/ LOOP ELECTRODE EXCISION    . CHOLECYSTECTOMY     . COLONOSCOPY     [name]  . fatty tumor removed from back     X 2  . HEMORRHOID SURGERY    . ILEOSTOMY CLOSURE    . RESTORATIVE PROCTOCOLECTOMY     with insertion of ileoanal J Pouch with loop ileostomy  . SHOULDER ARTHROSCOPY WITH CAPSULORRHAPHY Left 06/14/2019   Procedure: LEFT SHOULDER ARTHRSCOPIC REPAIR OF BONY BANKART FRACTURE;  Surgeon: Tania Ade, MD;  Location: [facility];  Service: Orthopedics;  Laterality: Left;  REQUEST 90 MINUTE  . SIGMOIDOSCOPY    . TOTAL ABDOMINAL HYSTERECTOMY  1998   TAH/LSO  . TUBAL LIGATION    . UPPER GASTROINTESTINAL ENDOSCOPY     [name]     OB History    Gravida  4   Para  2  Term  2   Preterm      AB  2   Living  2     SAB  2   IAB      Ectopic      Multiple      Live Births  2           Family History  Problem Relation Age of Onset  . Ulcerative colitis Father   . Hypertension Father   . Heart attack Father   . Hypertension Mother   . Heart disease Mother        s/p pci  . Ulcerative colitis Daughter   . Irritable bowel syndrome Other        grandchildren  . Diabetes Sister   . Cancer Sister        uterine  . Cancer Maternal Uncle        LUNG  . Colon cancer Neg Hx   . Esophageal cancer Neg Hx   . Stomach cancer Neg Hx   . Rectal cancer Neg Hx     Social History   Tobacco Use  . Smoking status: Former Smoker    Packs/day: 1.00    Years: 4.00    Pack years: 4.00    Types: Cigarettes    Quit date: 05/21/1979    Years since quitting: 41.3  . Smokeless tobacco: Never Used  Vaping Use  . Vaping Use: Never used  Substance Use Topics  . Alcohol use: No    Alcohol/week: 0.0 standard drinks  . Drug use: No    Home Medications Prior to Admission medications   Medication Sig Start Date End Date Taking? Authorizing Provider  allopurinol (ZYLOPRIM) 300 MG tablet TAKE 1 TABLET BY MOUTH EVERY DAY 08/27/20   Carollee Herter, Alferd Apa, DO  calcium carbonate (TUMS - DOSED IN MG ELEMENTAL CALCIUM) 500 MG  chewable tablet Chew 1 tablet by mouth daily as needed for indigestion or heartburn.    [provider]  DULoxetine (CYMBALTA) 60 MG capsule Take 2 capsules (120 mg total) by mouth daily. 06/05/20   Ann Held, DO  Ergocalciferol (VITAMIN D2) 2000 units TABS Take 2,000 Units by mouth every 30 (thirty) days.     [provider]  famotidine (PEPCID) 20 MG tablet TAKE 1 TABLET BY MOUTH TWICE A DAY 07/01/20   Armbruster, Carlota Raspberry, MD  folic acid (FOLVITE) 1 MG tablet Take 2 tablets (2 mg total) by mouth daily. 05/26/17   [name], MD  gabapentin (NEURONTIN) 100 MG capsule TAKE 1 CAPSULE BY MOUTH THREE TIMES A DAY Patient taking differently: Take 100 mg by mouth 2 (two) times daily. 05/01/20   Roma Schanz R, DO  metFORMIN (GLUCOPHAGE-XR) 500 MG 24 hr tablet TAKE 1 TABLET BY MOUTH EVERY DAY WITH BREAKFAST 08/27/20   Carollee Herter, Alferd Apa, DO  metoprolol succinate (TOPROL-XL) 100 MG 24 hr tablet Take 50 mg by mouth 2 (two) times daily. Take 100 mg in the a.m. and 50 mg in the evening    [provider]  Probiotic Product (PROBIOTIC PO) Take by mouth.    [provider]  simethicone (MYLICON) 80 MG chewable tablet Chew 80 mg by mouth daily as needed [employer]).     [provider]  vedolizumab (ENTYVIO) 300 MG injection Inject 300 mg into the vein every 30 (thirty) days.    [provider]  Zinc 50 MG TABS Take by mouth.    [provider]  Allergies    Sulfa antibiotics, Morphine, and Sulfonamide derivatives  Review of Systems   Review of Systems  Gastrointestinal: Positive for diarrhea.  Neurological: Positive for weakness.  All other systems reviewed and are negative.   Physical Exam Updated Vital Signs BP 103/64 (BP Location: Left Arm)   Pulse (!) 106   Temp (!) 94.2 F (34.6 C) (Oral)   Resp 20   Ht 5' 3"  (1.6 m)   Wt 88.5 kg   SpO2 98%   BMI 34.54 kg/m   Physical Exam Vitals and nursing note  reviewed.  Constitutional:      General: She is not in acute distress.    Appearance: She is well-developed and well-nourished. She is obese.  HENT:     Head: Atraumatic.     Mouth/Throat:     Mouth: Mucous membranes are dry.  Eyes:     Conjunctiva/sclera: Conjunctivae normal.  Cardiovascular:     Rate and Rhythm: Tachycardia present.     Pulses: Normal pulses.     Heart sounds: Normal heart sounds.  Pulmonary:     Effort: Pulmonary effort is normal.     Breath sounds: Normal breath sounds. No wheezing, rhonchi or rales.  Abdominal:     General: Bowel sounds are normal.     Palpations: Abdomen is soft.     Tenderness: There is no abdominal tenderness.     Hernia: No hernia is present.  Genitourinary:    Comments: Chaperone present during exam.  Significant macerated skin breakdown to her perineum.  Intact rectum with decreased rectal tone.  Normal color stool on glove. rectal temp 97.3 Musculoskeletal:     Cervical back: Neck supple.  Skin:    Findings: No rash.  Neurological:     Mental Status: She is alert and oriented to person, place, and time.  Psychiatric:        Mood and Affect: Mood and affect and mood normal.     ED Results / Procedures / Treatments   Labs (all labs ordered are listed, but only abnormal results are displayed) Labs Reviewed  CBC - Abnormal; Notable for the following components:      Result Value   WBC 15.0 (*)    MCV 103.3 (*)    Platelets 451 (*)    All other components within normal limits  COMPREHENSIVE METABOLIC PANEL - Abnormal; Notable for the following components:   Sodium 130 (*)    Chloride 91 (*)    CO2 16 (*)    Glucose, Bld 165 (*)    BUN 77 (*)    Creatinine, Ser 6.61 (*)    Calcium 10.4 (*)    Total Protein 9.9 (*)    GFR, Estimated 6 (*)    Anion gap 23 (*)    All other components within normal limits  LACTIC ACID, PLASMA - Abnormal; Notable for the following components:   Lactic Acid, Venous 2.3 (*)    All other  components within normal limits  CULTURE, BLOOD (SINGLE)  CULTURE, BLOOD (ROUTINE X 2)  CULTURE, BLOOD (ROUTINE X 2)  URINE CULTURE  SARS CORONAVIRUS 2 (TAT 6-24 HRS)  PROTIME-INR  APTT  LACTIC ACID, PLASMA  URINALYSIS, ROUTINE W REFLEX MICROSCOPIC    EKG EKG Interpretation  Date/Time:  Sunday September 21 2020 05:21:21 EST Ventricular Rate:  93 PR Interval:    QRS Duration: 119 QT Interval:  407 QTC Calculation: 507 R Axis:   -54 Text Interpretation: Sinus rhythm Probable left atrial enlargement  Left anterior fascicular block Left ventricular hypertrophy No acute changes Confirmed by Addison Lank [phone]) on 09/21/2020 5:45:21 AM   Radiology DG Chest Port 1 View  Result Date: 09/21/2020 CLINICAL DATA:  Weakness. EXAM: PORTABLE CHEST 1 VIEW COMPARISON:  Jan 14, 2017 FINDINGS: The heart size and mediastinal contours are within normal limits. Both lungs are clear. The visualized skeletal structures are unremarkable. IMPRESSION: No active disease. Electronically Signed   By: Constance Holster M.D.   On: 09/21/2020 05:59    Procedures .Critical Care Performed by: Domenic Moras, PA-C Authorized by: Domenic Moras, PA-C   Critical care provider statement:    Critical care time (minutes):  37   Critical care was time spent personally by me on the following activities:  Discussions with consultants, evaluation of patient's response to treatment, examination of patient, ordering and performing treatments and interventions, ordering and review of laboratory studies, ordering and review of radiographic studies, pulse oximetry, re-evaluation of patient's condition, obtaining history from patient or surrogate and review of old charts   (including critical care time)  Medications Ordered in ED Medications  lactated ringers infusion (has no administration in time range)  metroNIDAZOLE (FLAGYL) IVPB 500 mg (500 mg Intravenous New Bag/Given 09/21/20 0536)  sodium chloride 0.9 % bolus 1,000 mL  (0 mLs Intravenous Stopped 09/21/20 0523)  lactated ringers bolus 1,000 mL (1,000 mLs Intravenous New Bag/Given 09/21/20 0523)    And  lactated ringers bolus 1,000 mL (1,000 mLs Intravenous New Bag/Given 09/21/20 0524)    And  lactated ringers bolus 1,000 mL (1,000 mLs Intravenous New Bag/Given 09/21/20 0524)  cefTRIAXone (ROCEPHIN) 2 g in sodium chloride 0.9 % 100 mL IVPB (2 g Intravenous New Bag/Given 09/21/20 0538)    ED Course  I have reviewed the triage vital signs and the nursing notes.  Pertinent labs & imaging results that were available during my care of the patient were reviewed by me and considered in my medical decision making (see chart for details).    MDM Rules/Calculators/A&P                          BP 113/75   Pulse 96   Temp (!) 97.3 F (36.3 C) (Oral) Comment: Junior's presence brought her warmth  Simultaneous filing. User may not have seen previous data. Comment (Src): Simultaneous filing. User may not have seen previous data.  Resp 18   Ht 5' 3"  (1.6 m)   Wt 88.5 kg   SpO2 97%   BMI 34.54 kg/m   Final Clinical Impression(s) / ED Diagnoses Final diagnoses:  Sepsis with acute renal failure without septic shock, due to unspecified organism, unspecified acute renal failure type ([place])  Dehydration  Diarrhea of infectious origin    Rx / DC Orders ED Discharge Orders    None     4:39 AM Patient with history of recurrent C. difficile infection, was on Dificid presenting for 3 to 4 days of persistent diarrhea.  She  endorse feeling generalized weakness and fatigue likely from fluid loss.  She was found to be hypotensive and tachycardic with a temperature of 94.2.  She is mentating appropriately and appears to be in no acute discomfort.  IV fluid started.  Work-up initiated.  6:09 AM Labs remarkable for an elevated white count of 15.0, and elevated lactic acid of 2.3, significant worsening renal function with a BUN 77 and creatinine of 6.61 along with a GFR of 6.   Chest x-ray  unremarkable, COVID test is currently pending.  Since patient was tachycardic, hypotensive, elevated white count with elevated lactic acid, code sepsis initiated and patient was given antibiotic along with fluid resuscitation at 30 mill per kilogram.  Sepsis reassessment done  Appreciate consultation from [facility] hospitalist, Dr. Myna Hidalgo who agrees to admit patient.  Morning admitting team will likely take over the care. COVID test currently pending. Care discussed with Dr. Leonette Monarch.   [name] was evaluated in Emergency Department on 09/21/2020 for the symptoms described in the history of present illness. She was evaluated in the context of the global COVID-19 pandemic, which necessitated consideration that the patient might be at risk for infection with the SARS-CoV-2 virus that causes COVID-19. Institutional protocols and algorithms that pertain to the evaluation of patients at risk for COVID-19 are in a state of rapid change based on information released by regulatory bodies including the CDC and federal and state organizations. These policies and algorithms were followed during the patient's care in the ED.    Domenic Moras, PA-C 09/21/20 0630    Fatima Blank, MD 09/21/20 1759

## 2020-09-22 ENCOUNTER — Inpatient Hospital Stay (HOSPITAL_COMMUNITY): Payer: Medicare Other

## 2020-09-22 ENCOUNTER — Encounter (HOSPITAL_COMMUNITY): Payer: Self-pay | Admitting: Family Medicine

## 2020-09-22 DIAGNOSIS — U071 COVID-19: Secondary | ICD-10-CM

## 2020-09-22 LAB — COMPREHENSIVE METABOLIC PANEL
ALT: 15 U/L (ref 0–44)
AST: 26 U/L (ref 15–41)
Albumin: 3.1 g/dL — ABNORMAL LOW (ref 3.5–5.0)
Alkaline Phosphatase: 70 U/L (ref 38–126)
Anion gap: 14 (ref 5–15)
BUN: 60 mg/dL — ABNORMAL HIGH (ref 8–23)
CO2: 16 mmol/L — ABNORMAL LOW (ref 22–32)
Calcium: 8.5 mg/dL — ABNORMAL LOW (ref 8.9–10.3)
Chloride: 105 mmol/L (ref 98–111)
Creatinine, Ser: 4.03 mg/dL — ABNORMAL HIGH (ref 0.44–1.00)
GFR, Estimated: 12 mL/min — ABNORMAL LOW (ref >60)
Glucose, Bld: 96 mg/dL (ref 70–99)
Potassium: 3.4 mmol/L — ABNORMAL LOW (ref 3.5–5.1)
Sodium: 135 mmol/L (ref 135–145)
Total Bilirubin: 0.3 mg/dL (ref 0.3–1.2)
Total Protein: 6.6 g/dL (ref 6.5–8.1)

## 2020-09-22 LAB — CBC
HCT: 32.4 % — ABNORMAL LOW (ref 36.0–46.0)
Hemoglobin: 10.5 g/dL — ABNORMAL LOW (ref 12.0–15.0)
MCH: 33.5 pg (ref 26.0–34.0)
MCHC: 32.4 g/dL (ref 30.0–36.0)
MCV: 103.5 fL — ABNORMAL HIGH (ref 80.0–100.0)
Platelets: 260 10*3/uL (ref 150–400)
RBC: 3.13 MIL/uL — ABNORMAL LOW (ref 3.87–5.11)
RDW: 14.8 % (ref 11.5–15.5)
WBC: 9.9 10*3/uL (ref 4.0–10.5)
nRBC: 0 % (ref 0.0–0.2)

## 2020-09-22 LAB — PHOSPHORUS: Phosphorus: 3.6 mg/dL (ref 2.5–4.6)

## 2020-09-22 LAB — MAGNESIUM: Magnesium: 1.4 mg/dL — ABNORMAL LOW (ref 1.7–2.4)

## 2020-09-22 IMAGING — US US RENAL
1 series · 14 of 23 positions shown · non-contrast
Comparison: Renal ultrasound [DATE].

CLINICAL DATA: Acute kidney injury

EXAM:
RENAL / URINARY TRACT ULTRASOUND COMPLETE

[Series 1: us renal · 14 of 23 slices shown]
[im 1/23]
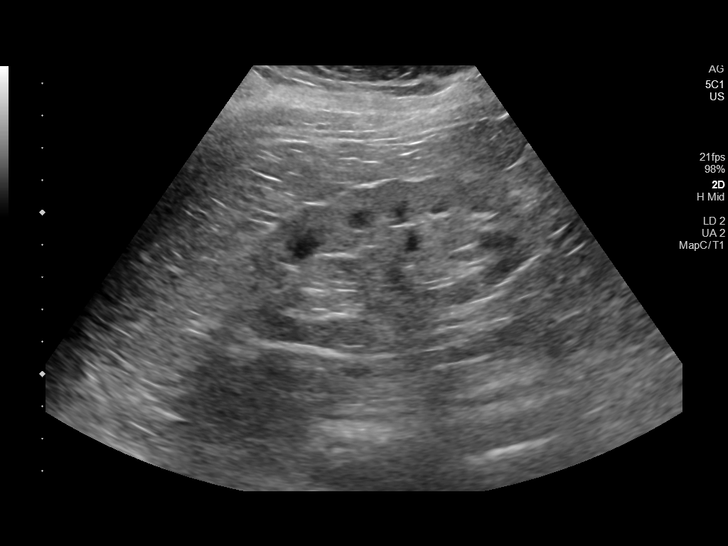
[im 3/23]
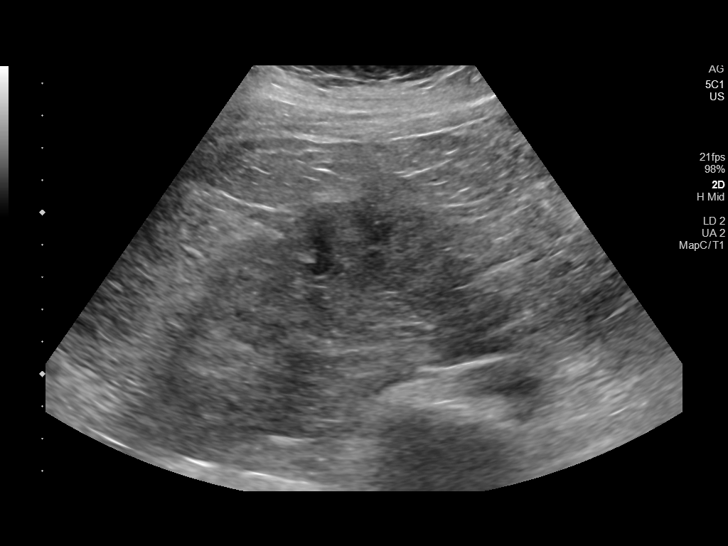
[im 5/23]
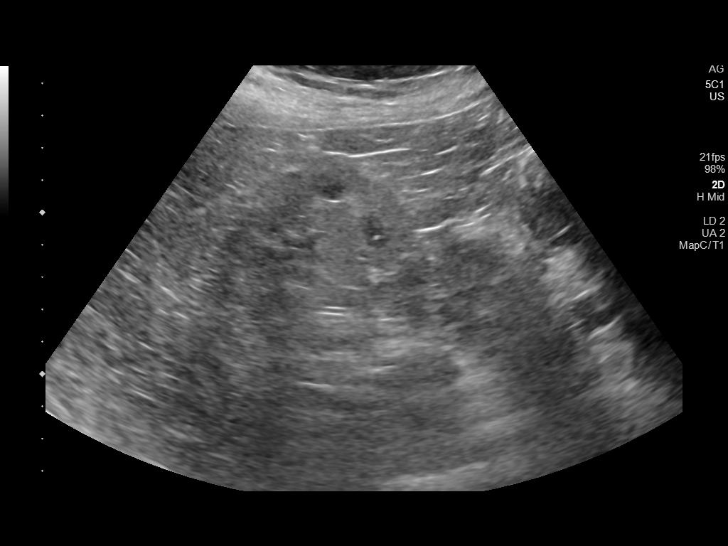
[im 6/23]
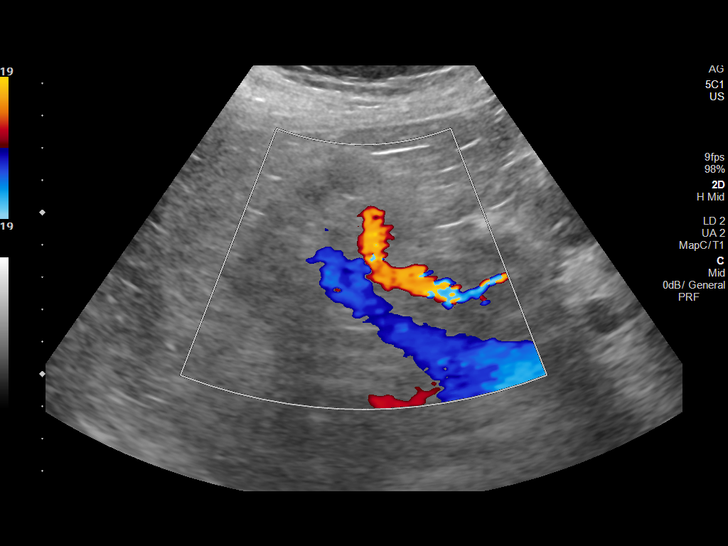
[im 8/23]
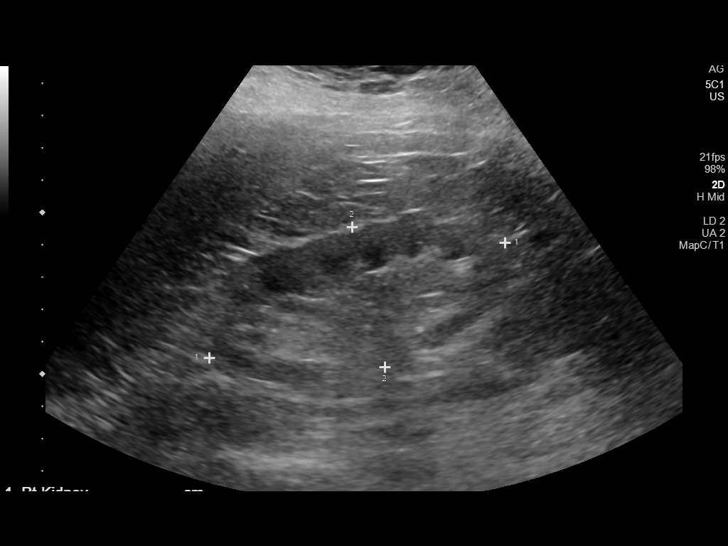
[im 10/23]
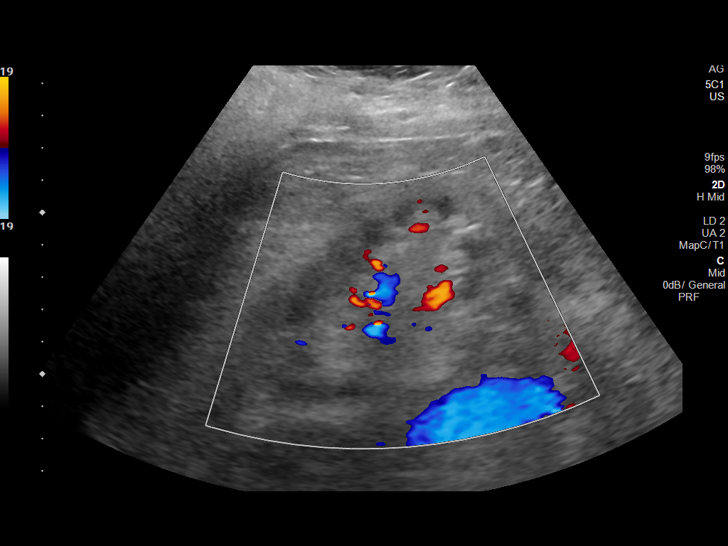
[im 11/23]
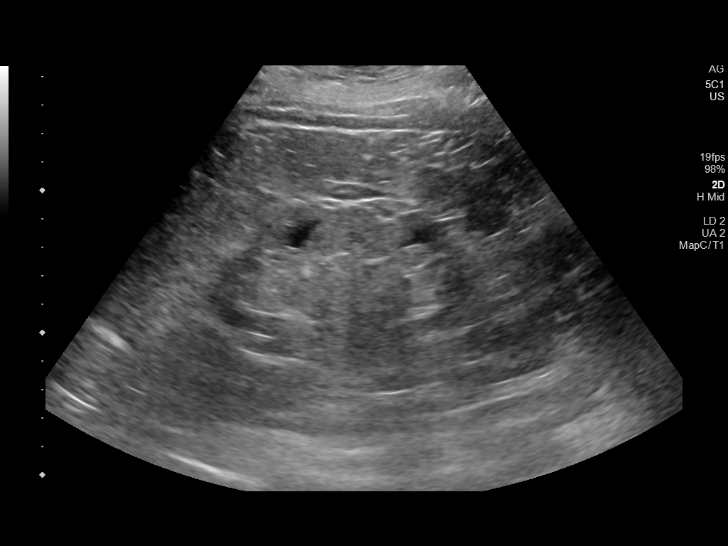
[im 13/23]
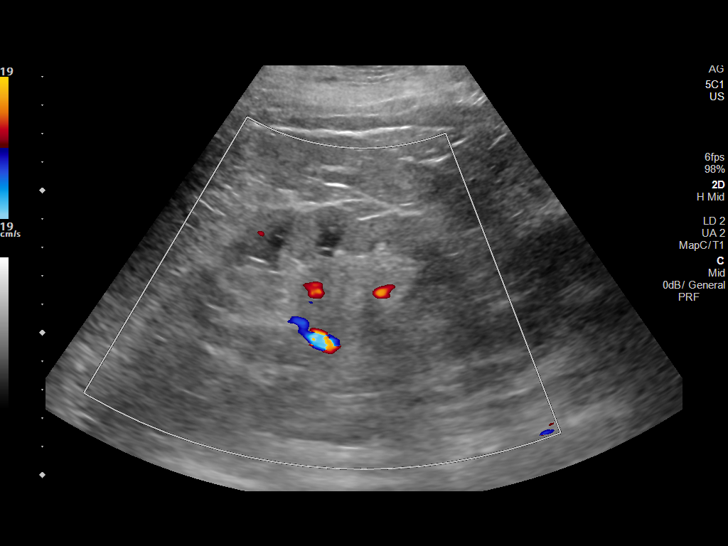
[im 14/23]
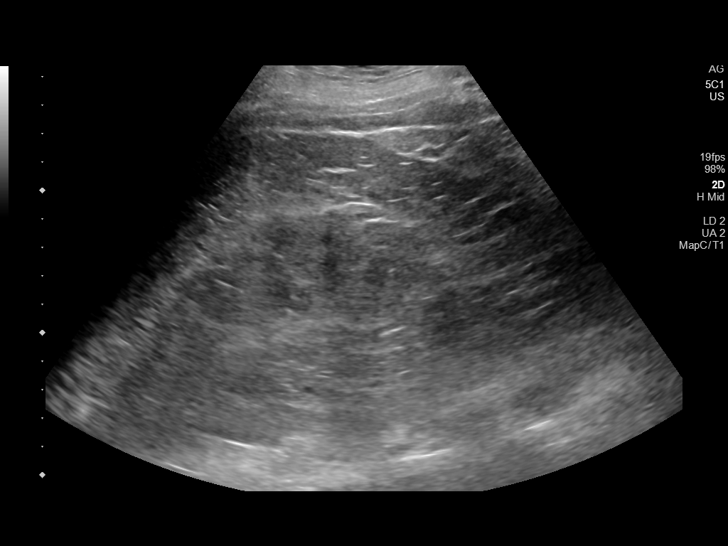
[im 16/23]
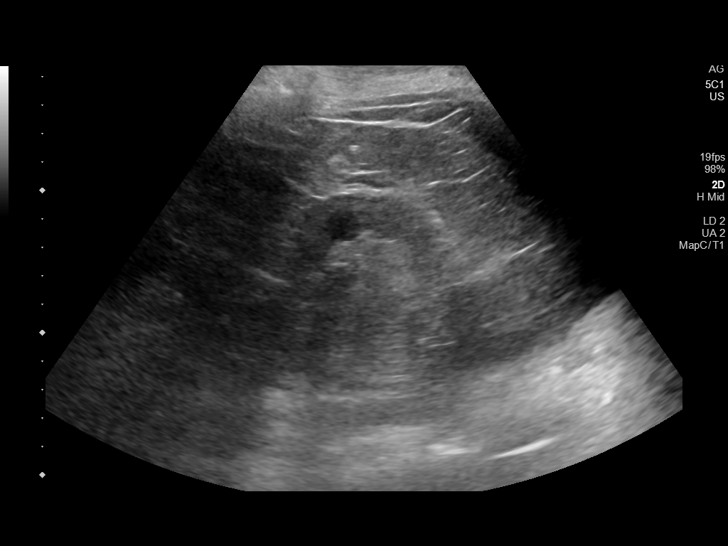
[im 18/23]
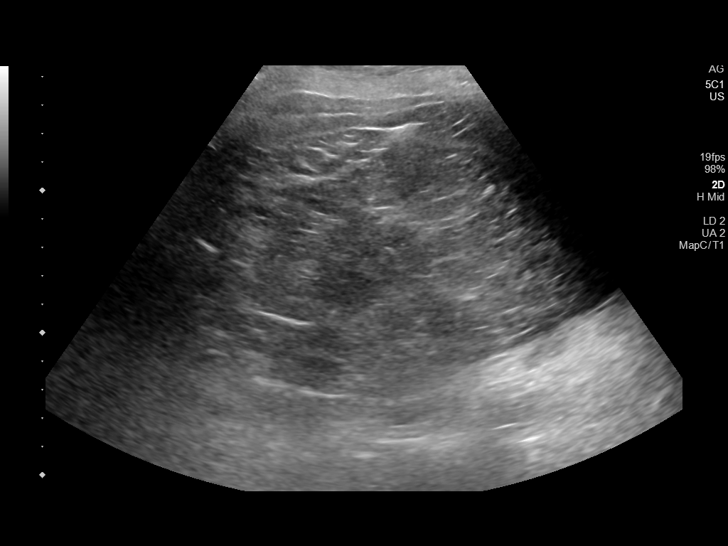
[im 19/23]
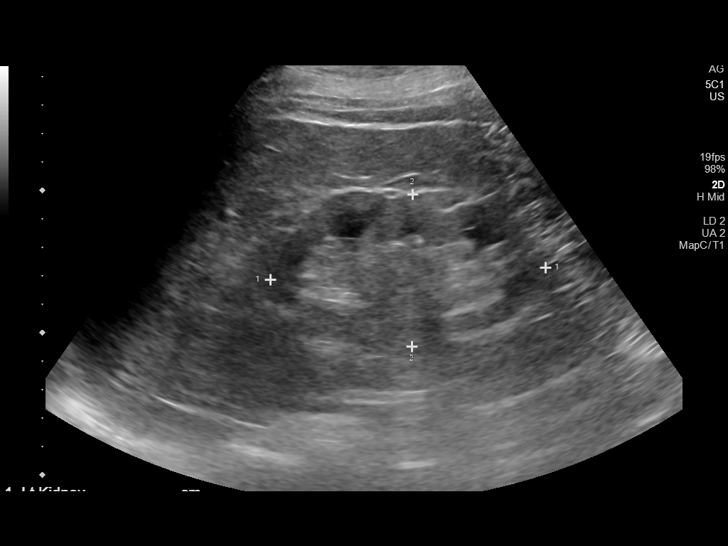
[im 21/23]
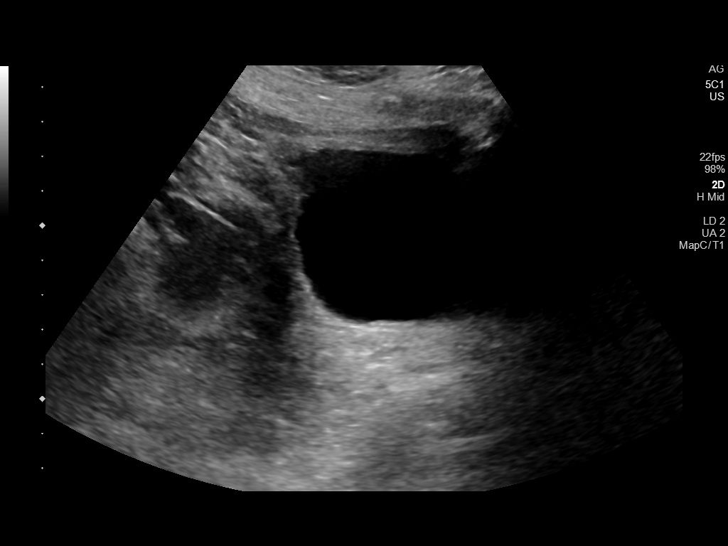
[im 23/23]
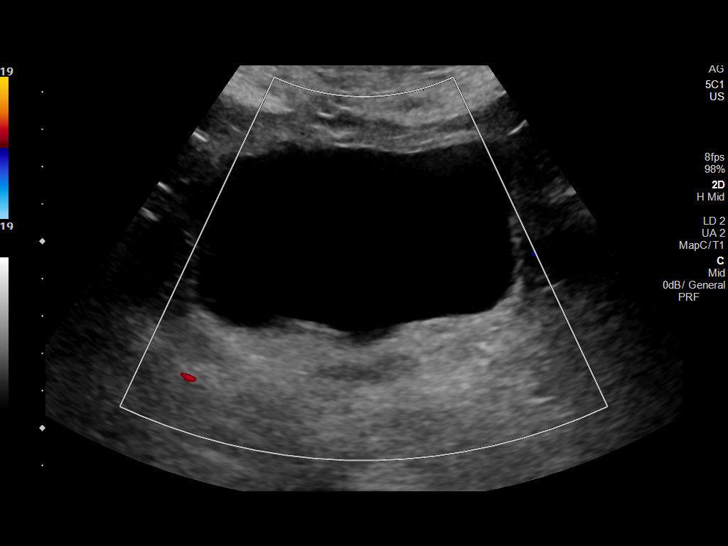

[14 of 23 positions shown; findings below may reference images not displayed]

FINDINGS: Right Kidney:

Renal measurements: 9.8 x 4.7 x 4.5 cm = volume: 107 mL.
Echogenicity is increased. No mass or hydronephrosis visualized.

Left Kidney:

Renal measurements: 9.7 x 5.4 x 4.1 cm = volume: 110 mL.
Echogenicity is increased. No mass or hydronephrosis visualized.

Bladder:

Appears normal for degree of bladder distention.

Other:

None.
IMPRESSION: Findings of chronic medical renal disease without hydronephrosis.

## 2020-09-22 MED ORDER — SODIUM CHLORIDE 0.9 % IV BOLUS
1500.0000 mL | Freq: Once | INTRAVENOUS | Status: AC
  Administered 2020-09-22: 1500 mL via INTRAVENOUS

## 2020-09-22 MED ORDER — POTASSIUM CHLORIDE CRYS ER 20 MEQ PO TBCR
20.0000 meq | EXTENDED_RELEASE_TABLET | Freq: Once | ORAL | Status: AC
  Administered 2020-09-22: 20 meq via ORAL
  Filled 2020-09-22: qty 1

## 2020-09-22 MED ORDER — MAGNESIUM SULFATE 2 GM/50ML IV SOLN
2.0000 g | Freq: Once | INTRAVENOUS | Status: AC
  Administered 2020-09-22: 2 g via INTRAVENOUS
  Filled 2020-09-22: qty 50

## 2020-09-22 MED ORDER — METOPROLOL SUCCINATE ER 50 MG PO TB24
50.0000 mg | ORAL_TABLET | ORAL | Status: DC
Start: 2020-09-22 — End: 2020-09-22

## 2020-09-22 MED ORDER — METOPROLOL SUCCINATE ER 50 MG PO TB24
50.0000 mg | ORAL_TABLET | Freq: Every day | ORAL | Status: DC
  Administered 2020-09-22 – 2020-09-23 (×2): 50 mg via ORAL
  Filled 2020-09-22 (×2): qty 1

## 2020-09-22 MED ORDER — METOPROLOL SUCCINATE ER 100 MG PO TB24
100.0000 mg | ORAL_TABLET | Freq: Every day | ORAL | Status: DC
  Administered 2020-09-23 – 2020-09-24 (×2): 100 mg via ORAL
  Filled 2020-09-22 (×2): qty 1

## 2020-09-22 MED ORDER — SODIUM CHLORIDE 0.9 % IV SOLN: INTRAVENOUS | Status: DC

## 2020-09-22 NOTE — Progress Notes (Signed)
PROGRESS NOTE    [name]    Code Status: Full Code  HTD:[id number] DOB: [birth year] DOA: 09/21/2020 LOS: 1 days  PCP: Ann Held, DO CC:  Chief Complaint  Patient presents with  . Diarrhea  . Weakness       Hospital Summary   This is a 65 year old female with a history of Crohn's disease s/p colectomy with ileoproctostomy and J pouch revision in 1998 now with chronic pouchitis, CKD IV, restless leg syndrome, gout, GERD, C diff, recent hospitalization from 12/17-12/20 for C diff colitis, hypovolemia and KAI and completed a course of Dificid at that time who presented on 1/16 with recurrent diarrhea for several days.  ED course: Vitals in the ED, temperature 94.2 F, blood pressure 103/64, heart rate 106, maintaining sats on room air. Relevant labs on initial presentation: Sodium 130, potassium 3.8, bicarb 16, chloride 91, glucose under 65, BUN 77, creatinine 6.6, calcium 10.4, albumin 4.7, WBC 15, hemoglobin 14.2. Sepsis protocol initiated in the ED, given septic bolus, Rocephin and Flagyl x1. She was found to be COVID 19 positive.     A & P   Active Problems:   Hyponatremia   Dehydration   Acute renal failure superimposed on stage 4 chronic kidney disease (HCC)   Metabolic acidosis   Diarrhea with dehydration   1. Acute Diarrhea a. Could be secondary to COVID 19 b. C diff negative c. Hold off on further antibiotics for now d. Check stool studies to rule out another infectious source  2. COVID 19 a. Tolerating RA b. Continue Remdesivir c. Check inflammatory markers  3. AKI on CKD 4 a. Cr worsened overnight despite IV fluids b. Unclear what her baseline is c. Renal US d. Nephrology consulted  4. Asymptomatic UTI a. Hold off on further antibiotics  5. Anion Gap metabolic acidosis a. Likely secondary to AKI b. Placed on sodium bicarb tabs on admission  6. Hypertension a. Restart home Toprol XL  7. Hx Crohn's Disease s/p colectomy with  ileoproctostomy and J pouch revision in 1998 now with chronic pouchitis a. Follows with [facility] outpatient b. On Entyvio  c. Hold on GI consult for now  8. Hypomagnesemia a. Replete IV  DVT prophylaxis: heparin injection 5,000 Units Start: 09/21/20 0930 SCDs Start: 09/21/20 7262     Disposition Plan:  Status is: Inpatient  Remains inpatient appropriate because:IV treatments appropriate due to intensity of illness or inability to take PO and Inpatient level of care appropriate due to severity of illness   Dispo: The patient is from: Home              Anticipated d/c is to: Home              Anticipated d/c date is: 3 days              Patient currently is not medically stable to d/c.           Pressure injury documentation    None  Consultants  Nephrology  Procedures  None  Antibiotics   Anti-infectives (From admission, onward)   Start     Dose/Rate Route Frequency Ordered Stop   09/22/20 1000  remdesivir 100 mg in sodium chloride 0.9 % 100 mL IVPB       "Followed by" Linked Group Details   100 mg 200 mL/hr over 30 Minutes Intravenous Daily 09/21/20 2120 09/26/20 0959   09/21/20 2200  remdesivir 100 mg in sodium chloride 0.9 %  100 mL IVPB       "Followed by" Linked Group Details   100 mg 200 mL/hr over 30 Minutes Intravenous  Once 09/21/20 2120 09/22/20 0059   09/21/20 0515  cefTRIAXone (ROCEPHIN) 2 g in sodium chloride 0.9 % 100 mL IVPB        2 g 200 mL/hr over 30 Minutes Intravenous  Once 09/21/20 0507 09/21/20 0625   09/21/20 0515  metroNIDAZOLE (FLAGYL) IVPB 500 mg        500 mg 100 mL/hr over 60 Minutes Intravenous  Once 09/21/20 0507 09/21/20 0659        Subjective   Patient seen and examined at bedside in no acute distress and resting comfortably. No acute events overnight. Still with diarrhea. Ambulating. Tolerating diet well.   Objective   Vitals:   09/21/20 2322 09/22/20 0437 09/22/20 0822 09/22/20 1228  BP: (!) 171/75 (!) 153/82 (!)  162/90 (!) 164/89  Pulse: (!) 108 99 (!) 103 (!) 106  Resp: 20 20 20 20   Temp: 98.9 F (37.2 C) 98.3 F (36.8 C) 97.8 F (36.6 C) 99.1 F (37.3 C)  TempSrc: Oral Oral Oral Oral  SpO2: 99% 94% 100% 100%  Weight: 91.2 kg     Height: 5' 3"  (1.6 m)       Intake/Output Summary (Last 24 hours) at 09/22/2020 1926 Last data filed at 09/22/2020 0809 Gross per 24 hour  Intake 2100 ml  Output 1400 ml  Net 700 ml   Filed Weights   09/21/20 0348 09/21/20 2322  Weight: 88.5 kg 91.2 kg    Examination:  Physical Exam Vitals and nursing note reviewed.  Constitutional:      Appearance: Normal appearance.  HENT:     Head: Normocephalic and atraumatic.  Eyes:     Conjunctiva/sclera: Conjunctivae normal.  Cardiovascular:     Rate and Rhythm: Normal rate and regular rhythm.  Pulmonary:     Effort: Pulmonary effort is normal.     Breath sounds: Normal breath sounds.  Abdominal:     General: Abdomen is flat. There is no distension.     Palpations: Abdomen is soft.     Tenderness: There is no abdominal tenderness.  Musculoskeletal:        General: No swelling or tenderness.  Skin:    Coloration: Skin is not jaundiced or pale.  Neurological:     Mental Status: She is alert. Mental status is at baseline.  Psychiatric:        Mood and Affect: Mood normal.        Behavior: Behavior normal.     Data Reviewed: I have personally reviewed following labs and imaging studies  CBC: Recent Labs  Lab 09/21/20 0400 09/21/20 1029 09/21/20 1816 09/21/20 1929 09/22/20 0410  WBC 15.0* 12.6* 8.3  --  9.9  HGB 14.2 10.4* 6.8* 10.4* 10.5*  HCT 43.3 32.1* 21.6* 32.1* 32.4*  MCV 103.3* 103.9* 106.4*  --  103.5*  PLT 451* 272 182  --  009   Basic Metabolic Panel: Recent Labs  Lab 09/21/20 0400 09/21/20 1029 09/21/20 1816 09/22/20 0410  NA 130*  --  131* 135  K 3.8  --  3.7 3.4*  CL 91*  --  105 105  CO2 16*  --  10* 16*  GLUCOSE 165*  --  68* 96  BUN 77*  --  45* 60*  CREATININE  6.61* 5.51* 2.87* 4.03*  CALCIUM 10.4*  --  7.4* 8.5*  MG  --  1.5*  --  1.4*  PHOS  --  5.9*  --  3.6   GFR: Estimated Creatinine Clearance: 14.7 mL/min (A) (by C-G formula based on SCr of 4.03 mg/dL (H)). Liver Function Tests: Recent Labs  Lab 09/21/20 0400 09/22/20 0410  AST 27 26  ALT 22 15  ALKPHOS 101 70  BILITOT 0.5 0.3  PROT 9.9* 6.6  ALBUMIN 4.7 3.1*   No results for input(s): LIPASE, AMYLASE in the last 168 hours. No results for input(s): AMMONIA in the last 168 hours. Coagulation Profile: Recent Labs  Lab 09/21/20 0400  INR 1.0   Cardiac Enzymes: No results for input(s): CKTOTAL, CKMB, CKMBINDEX, TROPONINI in the last 168 hours. BNP (last 3 results) No results for input(s): PROBNP in the last 8760 hours. HbA1C: No results for input(s): HGBA1C in the last 72 hours. CBG: No results for input(s): GLUCAP in the last 168 hours. Lipid Profile: No results for input(s): CHOL, HDL, LDLCALC, TRIG, CHOLHDL, LDLDIRECT in the last 72 hours. Thyroid Function Tests: No results for input(s): TSH, T4TOTAL, FREET4, T3FREE, THYROIDAB in the last 72 hours. Anemia Panel: No results for input(s): VITAMINB12, FOLATE, FERRITIN, TIBC, IRON, RETICCTPCT in the last 72 hours. Sepsis Labs: Recent Labs  Lab 09/21/20 0400 09/21/20 0558  LATICACIDVEN 2.3* 1.6    Recent Results (from the past 240 hour(s))  Culture, blood (single)     Status: None (Preliminary result)   Collection Time: 09/21/20  4:00 AM   Specimen: BLOOD  Result Value Ref Range Status   Specimen Description   Final    BLOOD LEFT ANTECUBITAL Performed at [facility], [place] [address], [place], [place] [address]    Special Requests   Final    BOTTLES DRAWN AEROBIC AND ANAEROBIC Blood Culture adequate volume Performed at [facility] [address] [place], [place] [address]    Culture   Final    NO GROWTH 1 DAY Performed at [facility], [place] [address] [place], [place]  [address]    Report Status PENDING  Incomplete  Blood Culture (routine x 2)     Status: None (Preliminary result)   Collection Time: 09/21/20  5:07 AM   Specimen: BLOOD  Result Value Ref Range Status   Specimen Description   Final    BLOOD RIGHT ANTECUBITAL Performed at [facility] [address] [place], [place] [address]    Special Requests   Final    BOTTLES DRAWN AEROBIC AND ANAEROBIC Blood Culture results may not be optimal due to an inadequate volume of blood received in culture bottles Performed at [facility] [address] [place], [place] [address]    Culture   Final    NO GROWTH < 24 HOURS Performed at [facility] [address], [place],  [address]    Report Status PENDING  Incomplete  SARS CORONAVIRUS 2 (TAT 6-24 HRS) Nasopharyngeal Nasopharyngeal Swab     Status: Abnormal   Collection Time: 09/21/20  5:34 AM   Specimen: Nasopharyngeal Swab  Result Value Ref Range Status   SARS Coronavirus 2 POSITIVE (A) NEGATIVE Final    Comment: (NOTE) SARS-CoV-2 target nucleic acids are DETECTED.  The SARS-CoV-2 RNA is generally detectable in upper and lower respiratory specimens during the acute phase of infection. Positive results are indicative of the presence of SARS-CoV-2 RNA. Clinical correlation with patient history and other diagnostic information is  necessary to determine patient infection status. Positive results  do not rule out bacterial infection or co-infection with other viruses.  The expected result is Negative.  Fact Sheet for Patients: SugarRoll.be  Fact Sheet for Healthcare Providers: https://www.woods-mathews.com/  This test is not yet approved or cleared by the [place] FDA and  has been authorized for detection and/or diagnosis of SARS-CoV-2 by FDA under an Emergency Use Authorization (EUA). This EUA will remain  in effect (meaning this test can be  used) for the duration of the COVID-19 declaration under Section 564(b)(1) of the Act, 21 U. S.C. section 360bbb-3(b)(1), unless the authorization is terminated or revoked sooner.   Performed at [facility], [place] [address] [place], [place] [address]   C Difficile Quick Screen w PCR reflex     Status: None   Collection Time: 09/21/20 11:47 AM  Result Value Ref Range Status   C Diff antigen NEGATIVE NEGATIVE Final   C Diff toxin NEGATIVE NEGATIVE Final   C Diff interpretation No C. difficile detected.  Final    Comment: Performed at [facility], [place] [address] [place], [place] [address]         Radiology Studies: US RENAL  Result Date: 09/22/2020 CLINICAL DATA:  Acute kidney injury EXAM: RENAL / URINARY TRACT ULTRASOUND COMPLETE COMPARISON:  Renal ultrasound August 23, 2020. FINDINGS: Right Kidney: Renal measurements: 9.8 x 4.7 x 4.5 cm = volume: 107 mL. Echogenicity is increased. No mass or hydronephrosis visualized. Left Kidney: Renal measurements: 9.7 x 5.4 x 4.1 cm = volume: 110 mL. Echogenicity is increased. No mass or hydronephrosis visualized. Bladder: Appears normal for degree of bladder distention. Other: None. IMPRESSION: Findings of chronic medical renal disease without hydronephrosis. Electronically Signed   By: Dahlia Bailiff MD   On: 09/22/2020 12:04   DG Chest Port 1 View  Result Date: 09/21/2020 CLINICAL DATA:  Weakness. EXAM: PORTABLE CHEST 1 VIEW COMPARISON:  Jan 14, 2017 FINDINGS: The heart size and mediastinal contours are within normal limits. Both lungs are clear. The visualized skeletal structures are unremarkable. IMPRESSION: No active disease. Electronically Signed   By: Constance Holster M.D.   On: 09/21/2020 05:59        Scheduled Meds: . cholecalciferol  1,000 Units Oral Daily  . DULoxetine  120 mg Oral Daily  . famotidine  10 mg Oral Daily  . folic acid  1 mg Oral Daily  . gabapentin  100 mg Oral QHS  . heparin  injection (subcutaneous)  5,000 Units Subcutaneous Q8H  . sodium bicarbonate  1,300 mg Oral TID   Continuous Infusions: . sodium chloride    . remdesivir 100 mg in NS 100 mL 100 mg (09/22/20 1158)  . sodium chloride       Time spent: 28 minutes with over 50% of the time coordinating the patient's care    Harold Hedge, DO [facility]   Call night coverage person covering after 7pm

## 2020-09-22 NOTE — Plan of Care (Signed)

## 2020-09-22 NOTE — TOC Progression Note (Signed)
Transition of Care [facility]) - Progression Note    Patient Details  Name: [name] MRN: [id number] Date of Birth: [birth year]  Transition of Care [facility]) CM/SW Contact  Purcell Mouton, RN Phone Number: 09/22/2020, 1:55 PM  Clinical Narrative:     Pt from home with relatives. Pt states that she had an appointment with MD tomorrow and Friday. Encouraged pt to call MD office to cancel for tomorrow. Pt states that she has no home health needs at present time. Encouraged pt to make appointment to see her doctor after discharge.   Expected Discharge Plan: Home/Self Care Barriers to Discharge: No Barriers Identified  Expected Discharge Plan and Services Expected Discharge Plan: Home/Self Care       Living arrangements for the past 2 months: Single Family Home                                       Social Determinants of Health (SDOH) Interventions    Readmission Risk Interventions No flowsheet data found.

## 2020-09-22 NOTE — Consult Note (Signed)
Renal Service Consult Note [facility]  [name] 09/22/2020 Sol Blazing, MD Requesting Physician: Dr Neysa Bonito  Reason for Consult: Renal failure HPI: The patient is a 65 y.o. year-old w/ hx of UC/ total colectomy, HTN, HL, CKD, depression admitted w/ diarrhea on 09/21/20. Creat has been up from baseline, asked to see for renal failure.   Pt seen in room. Is not on IVF"s.  Is thirsty.  Hx chronic diarrhea since her total colectomy for UC years ago.  Her b/l creat appears to be around 2.1- 2.4.  She wants at some point to talk w/ her GI and renal providers about getting IVF's at home 2-3 x per week. No abd pain or fevers.      ROS  denies CP  no joint pain   no HA  no blurry vision  no rash  no dysuria  no difficulty voiding  no change in urine color    Past Medical History  Past Medical History:  Diagnosis Date  . Abdominal pain    Hx  . Allergy   . Anal stenosis   . Anemia   . Anxiety   . Arthritis   . Asthma    patient does not have inhaler  . Blood in stool    Hx  . Blood in urine    Hx  . Blood transfusion without reported diagnosis   . Cataract   . CKD (chronic kidney disease) stage 3, GFR 30-59 ml/min (HCC)   . Crohn's colitis, other complication ([place])   . De Quervain's tenosynovitis   . Depression   . Difficulty urinating    Hx  . Easy bruising   . Esophagitis   . Fainting    History - resolved - due to dehydration  . Fatigue    Hx  . Fibroid    Hx  . Gastric polyp   . GERD (gastroesophageal reflux disease)   . 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 Hemorrhoids, external   . Hemorrhoids, internal   . Herpes, genital    vaginal treated 07/05/12 and pt states is resolved  . History of cervical dysplasia   . History of small bowel obstruction   . Hyperlipidemia    currently no meds  . Hyperparathyroidism   . Hypertension   . Hypokalemia    Hx  . IBD (inflammatory bowel disease)    initially colectomy  for suspected UC, 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  . Obesity   . Ovarian cyst   . Poor dental hygiene   . Pulmonary nodule, right    right upper lobe  . Rectal bleeding    Hx  . Rectal pain    Hx  . Renal insufficiency    CKD - stage 3  . RLS (restless legs syndrome)    no meds  . Tooth infection 11/2016   right low  . Ulcerative colitis   . Visual disturbance    wears glasses  . Weakness generalized    Hx - patient denies generalized weakness  . Wears dentures    upper only   Past Surgical History  Past Surgical History:  Procedure Laterality Date  . ANAL DILATION    . CERVICAL BIOPSY  W/ LOOP ELECTRODE EXCISION    . CHOLECYSTECTOMY    . COLONOSCOPY     [name]  . fatty tumor removed from back     X  2  . HEMORRHOID SURGERY    . ILEOSTOMY CLOSURE    . RESTORATIVE PROCTOCOLECTOMY     with insertion of ileoanal J Pouch with loop ileostomy  . SHOULDER ARTHROSCOPY WITH CAPSULORRHAPHY Left 06/14/2019   Procedure: LEFT SHOULDER ARTHRSCOPIC REPAIR OF BONY BANKART FRACTURE;  Surgeon: Tania Ade, MD;  Location: [facility];  Service: Orthopedics;  Laterality: Left;  REQUEST 90 MINUTE  . SIGMOIDOSCOPY    . TOTAL ABDOMINAL HYSTERECTOMY  1998   TAH/LSO  . TUBAL LIGATION    . UPPER GASTROINTESTINAL ENDOSCOPY     [name]   Family History  Family History  Problem Relation Age of Onset  . Ulcerative colitis Father   . Hypertension Father   . Heart attack Father   . Hypertension Mother   . Heart disease Mother        s/p pci  . Ulcerative colitis Daughter   . Irritable bowel syndrome Other        grandchildren  . Diabetes Sister   . Cancer Sister        uterine  . Cancer Maternal Uncle        LUNG  . Colon cancer Neg Hx   . Esophageal cancer Neg Hx   . Stomach cancer Neg Hx   . Rectal cancer Neg Hx    Social History  reports that she quit smoking about 41 years ago. Her smoking use included cigarettes. She has a 4.00 pack-year smoking history.  She has never used smokeless tobacco. She reports that she does not drink alcohol and does not use drugs. Allergies  Allergies  Allergen Reactions  . Sulfa Antibiotics Hives  . Morphine Other (See Comments)    Gives me crazy dreams Gives me crazy dreams  . Sulfonamide Derivatives Itching, Swelling and Rash   Home medications Prior to Admission medications   Medication Sig Start Date End Date Taking? Authorizing Provider  allopurinol (ZYLOPRIM) 300 MG tablet TAKE 1 TABLET BY MOUTH EVERY DAY Patient taking differently: Take 300 mg by mouth daily. 08/27/20  Yes Roma Schanz R, DO  cholecalciferol (VITAMIN D) 25 MCG (1000 UNIT) tablet Take 1,000 Units by mouth daily.   Yes [provider]  DULoxetine (CYMBALTA) 60 MG capsule Take 2 capsules (120 mg total) by mouth daily. 06/05/20  Yes Roma Schanz R, DO  famotidine (PEPCID) 20 MG tablet TAKE 1 TABLET BY MOUTH TWICE A DAY Patient taking differently: Take 20 mg by mouth 2 (two) times daily. 07/01/20  Yes Armbruster, Carlota Raspberry, MD  folic acid (FOLVITE) 1 MG tablet Take 2 tablets (2 mg total) by mouth daily. Patient taking differently: Take 1 mg by mouth daily. 05/26/17  Yes Ennever, Rudell Cobb, MD  gabapentin (NEURONTIN) 100 MG capsule TAKE 1 CAPSULE BY MOUTH THREE TIMES A DAY Patient taking differently: Take 100 mg by mouth 2 (two) times daily. 05/01/20  Yes Roma Schanz R, DO  metoprolol succinate (TOPROL-XL) 100 MG 24 hr tablet Take 50-100 mg by mouth See admin instructions. Take 100 mg in the a.m. and 50 mg in the evening   Yes [provider]  vedolizumab (ENTYVIO) 300 MG injection Inject 300 mg into the vein every 30 (thirty) days.   Yes [provider]  Vitamin D, Ergocalciferol, (DRISDOL) 1.25 MG (50000 UNIT) CAPS capsule Take 50,000 Units by mouth every 30 (thirty) days.   Yes [provider]     Vitals:   09/21/20 2322 09/22/20 9449 09/22/20 6759 09/22/20 1228  BP: (!) 171/75 (!)  153/82 (!) 162/90 (!) 164/89  Pulse: (!) 108 99 (!) 103 (!) 106  Resp: 20 20 20 20   Temp: 98.9 F (37.2 C) 98.3 F (36.8 C) 97.8 F (36.6 C) 99.1 F (37.3 C)  TempSrc: Oral Oral Oral Oral  SpO2: 99% 94% 100% 100%  Weight: 91.2 kg     Height: 5' 3"  (1.6 m)      Exam  alert, nad   no jvd  Chest cta bilat  Cor reg no RG  Abd soft ntnd no ascites   Ext no LE edema   Alert, NF, ox3    Baseline creat over last few years         2019   1.9- 2.2     21- 27 ml/min         2020   2.10 23 ml/min         2021   2.1- 2.4     21- 23  ml/min      UA 1/16 > 21-50 rbc and wbc's, many bact    Una 50  UCr 265     cXR 1/16 -  IMPRESSION:  No active disease.  Assessment/ Plan: 1. AKI on CKD 4 - b/l creat 2.1- 24, eGFR 21- 23 ml/min.  Appears to be mostly hemodynamic related to chronic/ intermittent GI losses as a complication of total colectomy in the past.  On exam has no edema, will resume IVF's at 100/hr (and this should remain running) and bolus 1.5 L tonight. In future she is hoping to get set up with IVF's at home a couple times per week. Will follow.  2. HTN - on metoprolol 3. Gout - on allopurinol 4. Depression - cymbalta      Kelly Splinter  MD 09/22/2020, 5:53 PM  Recent Labs  Lab 09/21/20 1816 09/21/20 1929 09/22/20 0410  WBC 8.3  --  9.9  HGB 6.8* 10.4* 10.5*   Recent Labs  Lab 09/21/20 1029 09/21/20 1816 09/22/20 0410  K  --  3.7 3.4*  BUN  --  45* 60*  CREATININE 5.51* 2.87* 4.03*  CALCIUM  --  7.4* 8.5*  PHOS 5.9*  --  3.6

## 2020-09-23 ENCOUNTER — Ambulatory Visit: Payer: Medicare Other | Admitting: Gastroenterology

## 2020-09-23 LAB — D-DIMER, QUANTITATIVE: D-Dimer, Quant: 1.99 ug/mL-FEU — ABNORMAL HIGH (ref 0.00–0.50)

## 2020-09-23 LAB — FERRITIN: Ferritin: 205 ng/mL (ref 11–307)

## 2020-09-23 LAB — MAGNESIUM: Magnesium: 1.8 mg/dL (ref 1.7–2.4)

## 2020-09-23 LAB — COMPREHENSIVE METABOLIC PANEL
ALT: 16 U/L (ref 0–44)
AST: 21 U/L (ref 15–41)
Albumin: 3.3 g/dL — ABNORMAL LOW (ref 3.5–5.0)
Alkaline Phosphatase: 69 U/L (ref 38–126)
Anion gap: 11 (ref 5–15)
BUN: 47 mg/dL — ABNORMAL HIGH (ref 8–23)
CO2: 21 mmol/L — ABNORMAL LOW (ref 22–32)
Calcium: 8.3 mg/dL — ABNORMAL LOW (ref 8.9–10.3)
Chloride: 108 mmol/L (ref 98–111)
Creatinine, Ser: 2.6 mg/dL — ABNORMAL HIGH (ref 0.44–1.00)
GFR, Estimated: 20 mL/min — ABNORMAL LOW (ref >60)
Glucose, Bld: 104 mg/dL — ABNORMAL HIGH (ref 70–99)
Potassium: 3.5 mmol/L (ref 3.5–5.1)
Sodium: 140 mmol/L (ref 135–145)
Total Bilirubin: 0.4 mg/dL (ref 0.3–1.2)
Total Protein: 7.1 g/dL (ref 6.5–8.1)

## 2020-09-23 LAB — URINE CULTURE

## 2020-09-23 LAB — CBC WITH DIFFERENTIAL/PLATELET
Abs Immature Granulocytes: 0.03 10*3/uL (ref 0.00–0.07)
Basophils Absolute: 0 10*3/uL (ref 0.0–0.1)
Basophils Relative: 1 %
Eosinophils Absolute: 0.8 10*3/uL — ABNORMAL HIGH (ref 0.0–0.5)
Eosinophils Relative: 11 %
HCT: 32.3 % — ABNORMAL LOW (ref 36.0–46.0)
Hemoglobin: 10.7 g/dL — ABNORMAL LOW (ref 12.0–15.0)
Immature Granulocytes: 0 %
Lymphocytes Relative: 37 %
Lymphs Abs: 2.7 10*3/uL (ref 0.7–4.0)
MCH: 34.1 pg — ABNORMAL HIGH (ref 26.0–34.0)
MCHC: 33.1 g/dL (ref 30.0–36.0)
MCV: 102.9 fL — ABNORMAL HIGH (ref 80.0–100.0)
Monocytes Absolute: 0.6 10*3/uL (ref 0.1–1.0)
Monocytes Relative: 8 %
Neutro Abs: 3.1 10*3/uL (ref 1.7–7.7)
Neutrophils Relative %: 43 %
Platelets: 237 10*3/uL (ref 150–400)
RBC: 3.14 MIL/uL — ABNORMAL LOW (ref 3.87–5.11)
RDW: 15 % (ref 11.5–15.5)
WBC: 7.3 10*3/uL (ref 4.0–10.5)
nRBC: 0 % (ref 0.0–0.2)

## 2020-09-23 LAB — C-REACTIVE PROTEIN: CRP: 5.8 mg/dL — ABNORMAL HIGH (ref <1.0)

## 2020-09-23 MED ORDER — SODIUM CHLORIDE 0.9 % IV BOLUS
1500.0000 mL | Freq: Every day | INTRAVENOUS | Status: DC
  Administered 2020-09-23: 1500 mL via INTRAVENOUS

## 2020-09-23 MED ORDER — GABAPENTIN 100 MG PO CAPS: 100.0000 mg | ORAL_CAPSULE | Freq: Three times a day (TID) | ORAL | Status: DC

## 2020-09-23 MED ORDER — GABAPENTIN 100 MG PO CAPS
100.0000 mg | ORAL_CAPSULE | Freq: Three times a day (TID) | ORAL | Status: DC
  Administered 2020-09-23 – 2020-09-24 (×3): 100 mg via ORAL
  Filled 2020-09-23 (×3): qty 1

## 2020-09-23 NOTE — Progress Notes (Addendum)
[facility] Progress Note  Subjective: pt seen in room, no c/o's, creat down 2.6.   Vitals:   09/22/20 2032 09/23/20 0640 09/23/20 0854 09/23/20 1403  BP: (!) 159/88 (!) 145/78  139/88  Pulse: 99 89 (!) 104 85  Resp: 20 20  15   Temp: 98.8 F (37.1 C) 98 F (36.7 C)  98.3 F (36.8 C)  TempSrc: Oral Oral  Oral  SpO2: 97% 96%  100%  Weight:      Height:        Exam: alert, nad   no jvd  Chest cta bilat  Cor reg no RG  Abd soft ntnd no ascites   Ext no LE edema   Alert, NF, ox3    Baseline creat over last few years         2019   1.9- 2.2     21- 27 ml/min         2020   2.10          23 ml/min         2021   2.1- 2.4     21- 23  ml/min      UA 1/16 > 21-50 rbc and wbc's, many bact    Una 50  UCr 265     cXR 1/16 -  IMPRESSION:  No active disease.  Assessment/ Plan: 1. AKI on CKD 4 - b/l creat 2.1- 24, eGFR 21- 23 ml/min. AKI is hemodynamic related to volume depletion caused by chronic diarrhea. Creat down today 2.6, close to baseline. Will write for 1.5 L NS bolus each evening until she is dc'd. In future she is hoping to get set up with IVF's at home. She will f/u w/ her renal MD at [place]. Will sign off.  2. SP total colectomy 3. Chronic diarrhea 4. HTN - on metoprolol 5. Gout - on allopurinol 6. Depression - cymbalta      Rob Delano Frate 09/23/2020, 4:32 PM   Recent Labs  Lab 09/21/20 1029 09/21/20 1816 09/22/20 0410 09/23/20 0430  K  --    < > 3.4* 3.5  BUN  --    < > 60* 47*  CREATININE 5.51*   < > 4.03* 2.60*  CALCIUM  --    < > 8.5* 8.3*  PHOS 5.9*  --  3.6  --   HGB 10.4*   < > 10.5* 10.7*   < > = values in this interval not displayed.   Inpatient medications: . cholecalciferol  1,000 Units Oral Daily  . DULoxetine  120 mg Oral Daily  . famotidine  10 mg Oral Daily  . folic acid  1 mg Oral Daily  . gabapentin  100 mg Oral TID  . heparin injection (subcutaneous)  5,000 Units Subcutaneous Q8H  . metoprolol succinate  100 mg Oral  Daily  . metoprolol succinate  50 mg Oral QHS  . sodium bicarbonate  1,300 mg Oral TID   . sodium chloride 100 mL/hr at 09/23/20 0310  . remdesivir 100 mg in NS 100 mL 100 mg (09/23/20 0904)   acetaminophen **OR** acetaminophen, ondansetron **OR** ondansetron (ZOFRAN) IV

## 2020-09-23 NOTE — Progress Notes (Signed)
PROGRESS NOTE    [name]    Code Status: Full Code  ZOX:[id number] DOB: 1956-03-07 DOA: 09/21/2020 LOS: 2 days  PCP: Ann Held, DO CC:  Chief Complaint  Patient presents with  . Diarrhea  . Weakness       Hospital Summary   This is a 65 year old female who has not been vaccinated against COVID 19 with a history of Crohn's disease s/p colectomy with ileoproctostomy and J pouch revision in 1998 now with chronic pouchitis, CKD IV, restless leg syndrome, gout, GERD, C diff, recent hospitalization from 12/17-12/20 for C diff colitis, hypovolemia and KAI and completed a course of Dificid at that time who presented on 1/16 with recurrent diarrhea for several days.  ED course: Vitals in the ED, temperature 94.2 F, blood pressure 103/64, heart rate 106, maintaining sats on room air. Relevant labs on initial presentation: Sodium 130, potassium 3.8, bicarb 16, chloride 91, glucose under 65, BUN 77, creatinine 6.6, calcium 10.4, albumin 4.7, WBC 15, hemoglobin 14.2. Sepsis protocol initiated in the ED, given septic bolus, Rocephin and Flagyl x1. She was found to be COVID 19 positive. Nephrology was consulted and renal function improved with IV fluids.     A & P   Active Problems:   Hyponatremia   Dehydration   Acute renal failure superimposed on stage 4 chronic kidney disease (HCC)   Metabolic acidosis   Diarrhea with dehydration   1. Acute Diarrhea, likely secondary to COVID 19 a. Stool studies negative b. COVID treatment as below  2. COVID 19 a. Tolerating RA b. Completed 3 days Remdesivir today c. No indication for steroids or baricitinib  3. AKI on CKD 4 a. Renal US unremarkable b. Improved with IV fluids ordered by nephro c. Discussed with Dr. Jonnie Finner today d. Continue IV fluids for tonight  4. Asymptomatic UTI a. Hold off on further antibiotics  5. Anion Gap metabolic acidosis a. Likely secondary to AKI b. Placed on sodium bicarb tabs on  admission  6. Hypertension a. Restart home Toprol XL  7. Hx Crohn's Disease s/p colectomy with ileoproctostomy and J pouch revision in 1998 now with chronic pouchitis a. Follows with [facility] outpatient b. On Entyvio  c. Hold on GI consult for now  8. Hypomagnesemia a. repleted  DVT prophylaxis: heparin injection 5,000 Units Start: 09/21/20 0930 SCDs Start: 09/21/20 8119     Disposition Plan: Continue IV fluids for tonight and likely can DC tomorrow, pending renal function Status is: Inpatient  Remains inpatient appropriate because:IV treatments appropriate due to intensity of illness or inability to take PO   Dispo: The patient is from: Home              Anticipated d/c is to: Home              Anticipated d/c date is: 1 day              Patient currently is not medically stable to d/c.           Pressure injury documentation    None  Consultants  Nephrology  Procedures  None  Antibiotics   Anti-infectives (From admission, onward)   Start     Dose/Rate Route Frequency Ordered Stop   09/22/20 1000  remdesivir 100 mg in sodium chloride 0.9 % 100 mL IVPB       "Followed by" Linked Group Details   100 mg 200 mL/hr over 30 Minutes Intravenous Daily 09/21/20 2120  09/26/20 0959   09/21/20 2200  remdesivir 100 mg in sodium chloride 0.9 % 100 mL IVPB       "Followed by" Linked Group Details   100 mg 200 mL/hr over 30 Minutes Intravenous  Once 09/21/20 2120 09/22/20 0059   09/21/20 0515  cefTRIAXone (ROCEPHIN) 2 g in sodium chloride 0.9 % 100 mL IVPB        2 g 200 mL/hr over 30 Minutes Intravenous  Once 09/21/20 0507 09/21/20 0625   09/21/20 0515  metroNIDAZOLE (FLAGYL) IVPB 500 mg        500 mg 100 mL/hr over 60 Minutes Intravenous  Once 09/21/20 0507 09/21/20 0659        Subjective   Patient seen and examined at bedside no acute distress and resting comfortably.  No events overnight.  Tolerating diet.  Denies any chest pain, shortness of breath,  fever, nausea, vomiting, urinary complaints and no recurrence of diarrhea.  Otherwise ROS negative  Objective   Vitals:   09/22/20 2032 09/23/20 0640 09/23/20 0854 09/23/20 1403  BP: (!) 159/88 (!) 145/78  139/88  Pulse: 99 89 (!) 104 85  Resp: 20 20  15   Temp: 98.8 F (37.1 C) 98 F (36.7 C)  98.3 F (36.8 C)  TempSrc: Oral Oral  Oral  SpO2: 97% 96%  100%  Weight:      Height:        Intake/Output Summary (Last 24 hours) at 09/23/2020 1733 Last data filed at 09/23/2020 0709 Gross per 24 hour  Intake 183.33 ml  Output 1000 ml  Net -816.67 ml   Filed Weights   09/21/20 0348 09/21/20 2322  Weight: 88.5 kg 91.2 kg    Examination:  Physical Exam Vitals and nursing note reviewed.  Constitutional:      Appearance: Normal appearance.  HENT:     Head: Normocephalic and atraumatic.  Eyes:     Conjunctiva/sclera: Conjunctivae normal.  Cardiovascular:     Rate and Rhythm: Normal rate and regular rhythm.  Pulmonary:     Effort: Pulmonary effort is normal.     Breath sounds: Normal breath sounds.  Abdominal:     General: Abdomen is flat.     Palpations: Abdomen is soft.  Musculoskeletal:        General: No swelling or tenderness.  Skin:    Coloration: Skin is not jaundiced or pale.  Neurological:     Mental Status: She is alert. Mental status is at baseline.  Psychiatric:        Mood and Affect: Mood normal.        Behavior: Behavior normal.     Data Reviewed: I have personally reviewed following labs and imaging studies  CBC: Recent Labs  Lab 09/21/20 0400 09/21/20 1029 09/21/20 1816 09/21/20 1929 09/22/20 0410 09/23/20 0430  WBC 15.0* 12.6* 8.3  --  9.9 7.3  NEUTROABS  --   --   --   --   --  3.1  HGB 14.2 10.4* 6.8* 10.4* 10.5* 10.7*  HCT 43.3 32.1* 21.6* 32.1* 32.4* 32.3*  MCV 103.3* 103.9* 106.4*  --  103.5* 102.9*  PLT 451* 272 182  --  260 885   Basic Metabolic Panel: Recent Labs  Lab 09/21/20 0400 09/21/20 1029 09/21/20 1816 09/22/20 0410  09/23/20 0430  NA 130*  --  131* 135 140  K 3.8  --  3.7 3.4* 3.5  CL 91*  --  105 105 108  CO2 16*  --  10*  16* 21*  GLUCOSE 165*  --  68* 96 104*  BUN 77*  --  45* 60* 47*  CREATININE 6.61* 5.51* 2.87* 4.03* 2.60*  CALCIUM 10.4*  --  7.4* 8.5* 8.3*  MG  --  1.5*  --  1.4* 1.8  PHOS  --  5.9*  --  3.6  --    GFR: Estimated Creatinine Clearance: 22.8 mL/min (A) (by C-G formula based on SCr of 2.6 mg/dL (H)). Liver Function Tests: Recent Labs  Lab 09/21/20 0400 09/22/20 0410 09/23/20 0430  AST 27 26 21   ALT 22 15 16   ALKPHOS 101 70 69  BILITOT 0.5 0.3 0.4  PROT 9.9* 6.6 7.1  ALBUMIN 4.7 3.1* 3.3*   No results for input(s): LIPASE, AMYLASE in the last 168 hours. No results for input(s): AMMONIA in the last 168 hours. Coagulation Profile: Recent Labs  Lab 09/21/20 0400  INR 1.0   Cardiac Enzymes: No results for input(s): CKTOTAL, CKMB, CKMBINDEX, TROPONINI in the last 168 hours. BNP (last 3 results) No results for input(s): PROBNP in the last 8760 hours. HbA1C: No results for input(s): HGBA1C in the last 72 hours. CBG: No results for input(s): GLUCAP in the last 168 hours. Lipid Profile: No results for input(s): CHOL, HDL, LDLCALC, TRIG, CHOLHDL, LDLDIRECT in the last 72 hours. Thyroid Function Tests: No results for input(s): TSH, T4TOTAL, FREET4, T3FREE, THYROIDAB in the last 72 hours. Anemia Panel: Recent Labs    09/23/20 0430  FERRITIN 205   Sepsis Labs: Recent Labs  Lab 09/21/20 0400 09/21/20 0558  LATICACIDVEN 2.3* 1.6    Recent Results (from the past 240 hour(s))  Culture, blood (single)     Status: None (Preliminary result)   Collection Time: 09/21/20  4:00 AM   Specimen: BLOOD  Result Value Ref Range Status   Specimen Description   Final    BLOOD LEFT ANTECUBITAL Performed at [facility], [place] [address] [place], [place] [address]    Special Requests   Final    BOTTLES DRAWN AEROBIC AND ANAEROBIC Blood Culture adequate  volume Performed at [facility] [address], [place], [place] [address]    Culture   Final    NO GROWTH 2 DAYS Performed at [facility] [address], [place], [place] [address]    Report Status PENDING  Incomplete  Blood Culture (routine x 2)     Status: None (Preliminary result)   Collection Time: 09/21/20  5:07 AM   Specimen: BLOOD  Result Value Ref Range Status   Specimen Description   Final    BLOOD RIGHT ANTECUBITAL Performed at [facility] [address], [place], [place] [address]    Special Requests   Final    BOTTLES DRAWN AEROBIC AND ANAEROBIC Blood Culture results may not be optimal due to an inadequate volume of blood received in culture bottles Performed at [facility] [address], [place], [place] [address]    Culture   Final    NO GROWTH 2 DAYS Performed at [facility] [address] [place], [place] [address]    Report Status PENDING  Incomplete  SARS CORONAVIRUS 2 (TAT 6-24 HRS) Nasopharyngeal Nasopharyngeal Swab     Status: Abnormal   Collection Time: 09/21/20  5:34 AM   Specimen: Nasopharyngeal Swab  Result Value Ref Range Status   SARS Coronavirus 2 POSITIVE (A) NEGATIVE Final    Comment: (NOTE) SARS-CoV-2 target nucleic acids are  DETECTED.  The SARS-CoV-2 RNA is generally detectable in upper and lower respiratory specimens during the acute phase of infection. Positive results are indicative of the presence of SARS-CoV-2 RNA. Clinical correlation with patient history and other diagnostic information is  necessary to determine patient infection status. Positive results do not rule out bacterial infection or co-infection with other viruses.  The expected result is Negative.  Fact Sheet for Patients: SugarRoll.be  Fact Sheet for Healthcare Providers: https://www.woods-mathews.com/  This test is not yet approved or cleared by  the [place] FDA and  has been authorized for detection and/or diagnosis of SARS-CoV-2 by FDA under an Emergency Use Authorization (EUA). This EUA will remain  in effect (meaning this test can be used) for the duration of the COVID-19 declaration under Section 564(b)(1) of the Act, 21 U. S.C. section 360bbb-3(b)(1), unless the authorization is terminated or revoked sooner.   Performed at [facility], [place] [address] [place], [place] [address]   Urine culture     Status: Abnormal   Collection Time: 09/21/20 10:39 AM   Specimen: In/Out Cath Urine  Result Value Ref Range Status   Specimen Description   Final    IN/OUT CATH URINE Performed at [facility] [address], [place], [place] [address]    Special Requests   Final    NONE Performed at [facility], [place] [address] [place], [place] [address]    Culture MULTIPLE SPECIES PRESENT, SUGGEST RECOLLECTION (A)  Final   Report Status 09/23/2020 FINAL  Final  C Difficile Quick Screen w PCR reflex     Status: None   Collection Time: 09/21/20 11:47 AM  Result Value Ref Range Status   C Diff antigen NEGATIVE NEGATIVE Final   C Diff toxin NEGATIVE NEGATIVE Final   C Diff interpretation No C. difficile detected.  Final    Comment: Performed at [facility], [place] [address] [place], [place] [address]         Radiology Studies: US RENAL  Result Date: 09/22/2020 CLINICAL DATA:  Acute kidney injury EXAM: RENAL / URINARY TRACT ULTRASOUND COMPLETE COMPARISON:  Renal ultrasound August 23, 2020. FINDINGS: Right Kidney: Renal measurements: 9.8 x 4.7 x 4.5 cm = volume: 107 mL. Echogenicity is increased. No mass or hydronephrosis visualized. Left Kidney: Renal measurements: 9.7 x 5.4 x 4.1 cm = volume: 110 mL. Echogenicity is increased. No mass or hydronephrosis visualized. Bladder: Appears normal for degree of bladder distention. Other: None. IMPRESSION: Findings of  chronic medical renal disease without hydronephrosis. Electronically Signed   By: Dahlia Bailiff MD   On: 09/22/2020 12:04        Scheduled Meds: . cholecalciferol  1,000 Units Oral Daily  . DULoxetine  120 mg Oral Daily  . famotidine  10 mg Oral Daily  . folic acid  1 mg Oral Daily  . gabapentin  100 mg Oral TID  . heparin injection (subcutaneous)  5,000 Units Subcutaneous Q8H  . metoprolol succinate  100 mg Oral Daily  . metoprolol succinate  50 mg Oral QHS  . sodium bicarbonate  1,300 mg Oral TID   Continuous Infusions: . sodium chloride 100 mL/hr at 09/23/20 0310  . remdesivir 100 mg in NS 100 mL 100 mg (09/23/20 0904)  . sodium chloride       Time spent: 20 minutes with over 50% of the time coordinating the patient's care    Harold Hedge, DO [facility]   Call  night coverage person covering after 7pm

## 2020-09-24 LAB — CBC WITH DIFFERENTIAL/PLATELET
Abs Immature Granulocytes: 0.05 10*3/uL (ref 0.00–0.07)
Basophils Absolute: 0.1 10*3/uL (ref 0.0–0.1)
Basophils Relative: 1 %
Eosinophils Absolute: 0.7 10*3/uL — ABNORMAL HIGH (ref 0.0–0.5)
Eosinophils Relative: 11 %
HCT: 32.4 % — ABNORMAL LOW (ref 36.0–46.0)
Hemoglobin: 10.5 g/dL — ABNORMAL LOW (ref 12.0–15.0)
Immature Granulocytes: 1 %
Lymphocytes Relative: 43 %
Lymphs Abs: 2.9 10*3/uL (ref 0.7–4.0)
MCH: 33.1 pg (ref 26.0–34.0)
MCHC: 32.4 g/dL (ref 30.0–36.0)
MCV: 102.2 fL — ABNORMAL HIGH (ref 80.0–100.0)
Monocytes Absolute: 0.6 10*3/uL (ref 0.1–1.0)
Monocytes Relative: 10 %
Neutro Abs: 2.3 10*3/uL (ref 1.7–7.7)
Neutrophils Relative %: 34 %
Platelets: 250 10*3/uL (ref 150–400)
RBC: 3.17 MIL/uL — ABNORMAL LOW (ref 3.87–5.11)
RDW: 14.7 % (ref 11.5–15.5)
WBC: 6.7 10*3/uL (ref 4.0–10.5)
nRBC: 0 % (ref 0.0–0.2)

## 2020-09-24 LAB — COMPREHENSIVE METABOLIC PANEL
ALT: 15 U/L (ref 0–44)
AST: 20 U/L (ref 15–41)
Albumin: 3.3 g/dL — ABNORMAL LOW (ref 3.5–5.0)
Alkaline Phosphatase: 66 U/L (ref 38–126)
Anion gap: 11 (ref 5–15)
BUN: 34 mg/dL — ABNORMAL HIGH (ref 8–23)
CO2: 25 mmol/L (ref 22–32)
Calcium: 8 mg/dL — ABNORMAL LOW (ref 8.9–10.3)
Chloride: 106 mmol/L (ref 98–111)
Creatinine, Ser: 1.75 mg/dL — ABNORMAL HIGH (ref 0.44–1.00)
GFR, Estimated: 32 mL/min — ABNORMAL LOW (ref >60)
Glucose, Bld: 96 mg/dL (ref 70–99)
Potassium: 3.6 mmol/L (ref 3.5–5.1)
Sodium: 142 mmol/L (ref 135–145)
Total Bilirubin: 0.5 mg/dL (ref 0.3–1.2)
Total Protein: 7.2 g/dL (ref 6.5–8.1)

## 2020-09-24 LAB — D-DIMER, QUANTITATIVE: D-Dimer, Quant: 2.02 ug/mL-FEU — ABNORMAL HIGH (ref 0.00–0.50)

## 2020-09-24 LAB — FERRITIN: Ferritin: 223 ng/mL (ref 11–307)

## 2020-09-24 LAB — MAGNESIUM: Magnesium: 1.3 mg/dL — ABNORMAL LOW (ref 1.7–2.4)

## 2020-09-24 LAB — C-REACTIVE PROTEIN: CRP: 3.1 mg/dL — ABNORMAL HIGH (ref <1.0)

## 2020-09-24 NOTE — Discharge Summary (Signed)
Physician Discharge Summary  [name] IZT:[id number] DOB: May 16, 1956 DOA: 09/21/2020  PCP: Ann Held, DO  Admit date: 09/21/2020 Discharge date: 09/24/2020  Admitted From: Home Disposition: Home   Recommendations for Outpatient Follow-up:  1. Follow up with PCP, nephrology, and GI in the next 1-2 weeks.  1. Would benefit from arrangement of IV fluids as outpatient.  Home Health: None Equipment/Devices: None Discharge Condition: Stable CODE STATUS: Full Diet recommendation: Renal  Brief/Interim Summary: This is a 65 year old female who has not been vaccinated against COVID 19 with a history of Crohn's disease s/p colectomy with ileoproctostomy and J pouch revision in 1998 now with chronic pouchitis, CKD IV, restless leg syndrome, gout, GERD, C diff, recent hospitalization from 12/17-12/20 for C diff colitis, hypovolemia and KAI and completed a course of Dificid at that time who presented on 1/16 with recurrent diarrhea for several days.  ED course: Vitals in the ED, temperature 94.2 F, blood pressure 103/64, heart rate 106, maintaining sats on room air. Relevant labs on initial presentation: Sodium 130, potassium 3.8, bicarb 16, chloride 91, glucose under 65, BUN 77, creatinine 6.6, calcium 10.4, albumin 4.7, WBC 15, hemoglobin 14.2. Sepsis protocol initiated in the ED, given septic bolus, Rocephin and Flagyl x1. She was found to be COVID 19 positive. Nephrology was consulted and renal function improved with IV fluids. The patient requests discharge home with creatinine actually below previous baseline and tolerating a full diet.  Discharge Diagnoses:  Active Problems:   Hyponatremia   Dehydration   Acute renal failure superimposed on stage 4 chronic kidney disease (HCC)   Metabolic acidosis   Diarrhea with dehydration  Acute diarrhea, likely secondary to COVID 19: Stool studies negative. Improved.  COVID-19 infection: s/p 3 days remdesivir. No hypoxia or other  symptoms. No further treatment warranted.   AKI on CKD 4: Renal US unremarkable. Improved with IV fluids ordered by nephro.  Asymptomatic pyuria: Hold off on further antibiotics  An[place] Gap metabolic acidosis: Likely secondary to AKI - Consider bicarbonate tabs chronically if persistent.   Hypertension - Restart home Toprol XL  Crohn's Disease s/p colectomy with ileoproctostomy and J pouch revision in 1998 now with chronic pouchitis:  - Follows with [facility] outpatient. Continue Entyvio  Hypomagnesemia: repleted  Obesity: Estimated body mass index is 35.62 kg/m as calculated from the following:   Height as of this encounter: 5\' 3"  (1.6 m).   Weight as of this encounter: 91.2 kg.  Discharge Instructions Discharge Instructions    Discharge instructions   Complete by: As directed    You are being discharged from the hospital after treatment for covid-19 infection. You are felt to be stable enough to no longer require inpatient monitoring, testing, and treatment, though you will need to follow the recommendations below: - Continue taking medications as you were previously - Per CDC guidelines, you will need to remain in isolation for 10 days from your first positive covid test.  - Please schedule follow up appointments with your PCP and nephrologist after isolation period is over (last day is 10/01/2020) - Follow up with your doctor in the next week via telehealth or seek medical attention right away if your symptoms get [place] are still encouraged to get a covid vaccination before 90 days (before immunity is thought to wear off).  Symptomatic. - Stay home for at least 5 days from your positive test and isolate from others in your home. Wear a well-fitted mask if  you must be around others in your home. - End isolation after 5 full days if you are fever-free for 24 hours (without the use of fever-reducing medication) and your symptoms are improving. - Take precautions until  day 10: Wear a well-fitted mask for 10 full days any time you are around others inside your home or in public. Do not go to places where you are unable to wear a mask. Avoid travel. Avoid being around people who are at high risk  Asymptomatic. - End isolation after at least 5 full days after your positive test if you did NOT have symptoms  Exposed, unvaccinated. - Quarantine at home for at least 5 days. Wear a well-fitted mask if you must be around others in your home. - Get tested at least 5 days after exposure (even if asymptomatic). - Watch for symptoms for 10 days. Isolate and test if symptoms develop.  - Wear a well-fitted mask for 10 full days any time you are around others inside your home or in public. Do not go to places where you are unable to wear a mask. Avoid travel. Avoid being around people who are at high risk  Exposed, vaccinated or infected w/in past 90 days. - No quarantine unless you develop symptoms. - Get tested at least 5 days after exposure (even if asymptomatic). - Watch for symptoms for 10 days. Isolate and test if symptoms develop.  - Wear a well-fitted mask for 10 full days any time you are around others inside your home or in public. Do not go to places where you are unable to wear a mask. Avoid travel. Avoid being around people who are at high risk  Directions for you at home:  Wear a facemask You should wear a facemask that covers your nose and mouth when you are in the same room with other people and when you visit a healthcare provider. People who live with or visit you should also wear a facemask while they are in the same room with you.  Separate yourself from other people in your home As much as possible, you should stay in a different room from other people in your home. Also, you should use a separate bathroom, if available.  Avoid sharing household items You should not share dishes, drinking glasses, cups, eating utensils, towels, bedding, or other  items with other people in your home. After using these items, you should wash them thoroughly with soap and water.  Cover your coughs and sneezes Cover your mouth and nose with a tissue when you cough or sneeze, or you can cough or sneeze into your sleeve. Throw used tissues in a lined trash can, and immediately wash your hands with soap and water for at least 20 seconds or use an alcohol-based hand rub.  Wash your [facility] your hands often and thoroughly with soap and water for at least 20 seconds. You can use an alcohol-based hand sanitizer if soap and water are not available and if your hands are not visibly dirty. Avoid touching your eyes, nose, and mouth with unwashed hands.  Directions for those who live with, or provide care at home for you:  Limit the number of people who have contact with the patient If possible, have only one caregiver for the patient. Other household members should stay in another home or place of residence. If this is not possible, they should stay in another room, or be separated from the patient as much as possible. Use a separate  bathroom, if available. Restrict visitors who do not have an essential need to be in the home.  Ensure good ventilation Make sure that shared spaces in the home have good air flow, such as from an air conditioner or an opened window, weather permitting.  Wash your hands often Wash your hands often and thoroughly with soap and water for at least 20 seconds. You can use an alcohol based hand sanitizer if soap and water are not available and if your hands are not visibly dirty. Avoid touching your eyes, nose, and mouth with unwashed hands. Use disposable paper towels to dry your hands. If not available, use dedicated cloth towels and replace them when they become wet.  Wear a facemask and gloves Wear a disposable facemask at all times in the room and gloves when you touch or have contact with the patient's blood, body fluids,  and/or secretions or excretions, such as sweat, saliva, sputum, nasal mucus, vomit, urine, or feces.  Ensure the mask fits over your nose and mouth tightly, and do not touch it during use. Throw out disposable facemasks and gloves after using them. Do not reuse. Wash your hands immediately after removing your facemask and gloves. If your personal clothing becomes contaminated, carefully remove clothing and launder. Wash your hands after handling contaminated clothing. Place all used disposable facemasks, gloves, and other waste in a lined container before disposing them with other household waste. Remove gloves and wash your hands immediately after handling these items.  Do not share dishes, glasses, or other household items with the patient Avoid sharing household items. You should not share dishes, drinking glasses, cups, eating utensils, towels, bedding, or other items with a patient who is confirmed to have, or being evaluated for, COVID-19 infection. After the person uses these items, you should wash them thoroughly with soap and water.  Wash laundry thoroughly Immediately remove and wash clothes or bedding that have blood, body fluids, and/or secretions or excretions, such as sweat, saliva, sputum, nasal mucus, vomit, urine, or feces, on them. Wear gloves when handling laundry from the patient. Read and follow directions on labels of laundry or clothing items and detergent. In general, wash and dry with the warmest temperatures recommended on the label.  Clean all areas the individual has used often Clean all touchable surfaces, such as counters, tabletops, doorknobs, bathroom fixtures, toilets, phones, keyboards, tablets, and bedside tables, every day. Also, clean any surfaces that may have blood, body fluids, and/or secretions or excretions on them. Wear gloves when cleaning surfaces the patient has come in contact with. Use a diluted bleach solution (e.g., dilute bleach with 1 part bleach  and 10 parts water) or a household disinfectant with a label that says EPA-registered for coronaviruses. To make a bleach solution at home, add 1 tablespoon of bleach to 1 quart (4 cups) of water. For a larger supply, add  cup of bleach to 1 gallon (16 cups) of water. Read labels of cleaning products and follow recommendations provided on product labels. Labels contain instructions for safe and effective use of the cleaning product including precautions you should take when applying the product, such as wearing gloves or eye protection and making sure you have good ventilation during use of the product. Remove gloves and wash hands immediately after cleaning.  Monitor yourself for signs and symptoms of illness Caregivers and household members are considered close contacts, should monitor their health, and will be asked to limit movement outside of the home to the extent possible.  Follow the monitoring steps for close contacts listed on the symptom monitoring form.  If you have additional questions, contact your local health department or call the epidemiologist on call at [phone] (available 24/7). This guidance is subject to change. For the most up-to-date guidance from CDC, please refer to their website: YouBlogs.pl   Increase activity slowly   Complete by: As directed      Allergies as of 09/24/2020      Reactions   Sulfa Antibiotics Hives   Morphine Other (See Comments)   Gives me crazy dreams Gives me crazy dreams   Sulfonamide Derivatives Itching, Swelling, Rash      Medication List    TAKE these medications   allopurinol 300 MG tablet Commonly known as: ZYLOPRIM TAKE 1 TABLET BY MOUTH EVERY DAY   cholecalciferol 25 MCG (1000 UNIT) tablet Commonly known as: VITAMIN D Take 1,000 Units by mouth daily.   DULoxetine 60 MG capsule Commonly known as: CYMBALTA Take 2 capsules (120 mg total) by mouth daily.    Entyvio 300 MG injection Generic drug: vedolizumab Inject 300 mg into the vein every 30 (thirty) days.   famotidine 20 MG tablet Commonly known as: PEPCID TAKE 1 TABLET BY MOUTH TWICE A DAY   folic acid 1 MG tablet Commonly known as: FOLVITE Take 2 tablets (2 mg total) by mouth daily. What changed: how much to take   gabapentin 100 MG capsule Commonly known as: NEURONTIN TAKE 1 CAPSULE BY MOUTH THREE TIMES A DAY What changed: See the new instructions.   metoprolol succinate 100 MG 24 hr tablet Commonly known as: TOPROL-XL Take 50-100 mg by mouth See admin instructions. Take 100 mg in the a.m. and 50 mg in the evening   Vitamin D (Ergocalciferol) 1.25 MG (50000 UNIT) Caps capsule Commonly known as: DRISDOL Take 50,000 Units by mouth every 30 (thirty) days.       Follow-up Information    Ann Held, DO. Schedule an appointment as soon as possible for a visit in 1 week(s).   Specialty: Family Medicine Contact information: [address] [place] [address] [phone]        Donato Heinz, MD Follow up.   Specialty: Nephrology Contact information: [address] [place] [address] [phone]              Allergies  Allergen Reactions  . Sulfa Antibiotics Hives  . Morphine Other (See Comments)    Gives me crazy dreams Gives me crazy dreams  . Sulfonamide Derivatives Itching, Swelling and Rash    Consultations:  Nephrology  Procedures/Studies: US RENAL  Result Date: 09/22/2020 CLINICAL DATA:  Acute kidney injury EXAM: RENAL / URINARY TRACT ULTRASOUND COMPLETE COMPARISON:  Renal ultrasound August 23, 2020. FINDINGS: Right Kidney: Renal measurements: 9.8 x 4.7 x 4.5 cm = volume: 107 mL. Echogenicity is increased. No mass or hydronephrosis visualized. Left Kidney: Renal measurements: 9.7 x 5.4 x 4.1 cm = volume: 110 mL. Echogenicity is increased. No mass or hydronephrosis visualized. Bladder: Appears normal for  degree of bladder distention. Other: None. IMPRESSION: Findings of chronic medical renal disease without hydronephrosis. Electronically Signed   By: Dahlia Bailiff MD   On: 09/22/2020 12:04   DG Chest Port 1 View  Result Date: 09/21/2020 CLINICAL DATA:  Weakness. EXAM: PORTABLE CHEST 1 VIEW COMPARISON:  Jan 14, 2017 FINDINGS: The heart size and mediastinal contours are within normal limits. Both lungs are clear. The visualized skeletal structures are unremarkable. IMPRESSION:  No active disease. Electronically Signed   By: Constance Holster M.D.   On: 09/21/2020 05:59     Subjective: Feels well, wants to go home. No dyspnea, chest pain, cough. No abdominal pain, N/V. Stool output down.  Discharge Exam: Vitals:   09/24/20 0921 09/24/20 1320  BP: (!) 147/91 (!) 153/92  Pulse: 73 77  Resp:    Temp:  98 F (36.7 C)  SpO2:  97%   General: Pt is alert, awake, not in acute distress Cardiovascular: RRR, S1/S2 +, no rubs, no gallops Respiratory: CTA bilaterally, no wheezing, no rhonchi Abdominal: Soft, NT, ND, bowel sounds + Extremities: No edema, no cyanosis  Labs: BNP (last 3 results) No results for input(s): BNP in the last 8760 hours. Basic Metabolic Panel: Recent Labs  Lab 09/21/20 0400 09/21/20 1029 09/21/20 1816 09/22/20 0410 09/23/20 0430 09/24/20 0439  NA 130*  --  131* 135 140 142  K 3.8  --  3.7 3.4* 3.5 3.6  CL 91*  --  105 105 108 106  CO2 16*  --  10* 16* 21* 25  GLUCOSE 165*  --  68* 96 104* 96  BUN 77*  --  45* 60* 47* 34*  CREATININE 6.61* 5.51* 2.87* 4.03* 2.60* 1.75*  CALCIUM 10.4*  --  7.4* 8.5* 8.3* 8.0*  MG  --  1.5*  --  1.4* 1.8 1.3*  PHOS  --  5.9*  --  3.6  --   --    Liver Function Tests: Recent Labs  Lab 09/21/20 0400 09/22/20 0410 09/23/20 0430 09/24/20 0439  AST 27 26 21 20   ALT 22 15 16 15   ALKPHOS 101 70 69 66  BILITOT 0.5 0.3 0.4 0.5  PROT 9.9* 6.6 7.1 7.2  ALBUMIN 4.7 3.1* 3.3* 3.3*   No results for input(s): LIPASE, AMYLASE in  the last 168 hours. No results for input(s): AMMONIA in the last 168 hours. CBC: Recent Labs  Lab 09/21/20 1029 09/21/20 1816 09/21/20 1929 09/22/20 0410 09/23/20 0430 09/24/20 0439  WBC 12.6* 8.3  --  9.9 7.3 6.7  NEUTROABS  --   --   --   --  3.1 2.3  HGB 10.4* 6.8* 10.4* 10.5* 10.7* 10.5*  HCT 32.1* 21.6* 32.1* 32.4* 32.3* 32.4*  MCV 103.9* 106.4*  --  103.5* 102.9* 102.2*  PLT 272 182  --  260 237 250   Cardiac Enzymes: No results for input(s): CKTOTAL, CKMB, CKMBINDEX, TROPONINI in the last 168 hours. BNP: Invalid input(s): POCBNP CBG: No results for input(s): GLUCAP in the last 168 hours. D-Dimer Recent Labs    09/23/20 0430 09/24/20 0439  DDIMER 1.99* 2.02*   Hgb A1c No results for input(s): HGBA1C in the last 72 hours. Lipid Profile No results for input(s): CHOL, HDL, LDLCALC, TRIG, CHOLHDL, LDLDIRECT in the last 72 hours. Thyroid function studies No results for input(s): TSH, T4TOTAL, T3FREE, THYROIDAB in the last 72 hours.  Invalid input(s): FREET3 Anemia work up Recent Labs    09/23/20 0430 09/24/20 0439  FERRITIN 205 223   Urinalysis    Component Value Date/Time   COLORURINE YELLOW 09/21/2020 1039   APPEARANCEUR CLOUDY (A) 09/21/2020 1039   LABSPEC 1.009 09/21/2020 1039   PHURINE 6.0 09/21/2020 [place] 09/21/2020 1039   GLUCOSEU NEGATIVE 03/14/2009 1603   HGBUR LARGE (A) 09/21/2020 1039   BILIRUBINUR NEGATIVE 09/21/2020 1039   BILIRUBINUR Negative 01/06/2018 [place] 09/21/2020 1039   PROTEINUR 30 (A) 09/21/2020  1039   UROBILINOGEN 1.0 01/06/2018 1306   UROBILINOGEN 0.2 04/12/2013 0900   NITRITE NEGATIVE 09/21/2020 1039   LEUKOCYTESUR LARGE (A) 09/21/2020 1039    Microbiology Recent Results (from the past 240 hour(s))  Culture, blood (single)     Status: None (Preliminary result)   Collection Time: 09/21/20  4:00 AM   Specimen: BLOOD  Result Value Ref Range Status   Specimen Description   Final     BLOOD LEFT ANTECUBITAL Performed at [facility], [place] [address], [place], [place] [address]    Special Requests   Final    BOTTLES DRAWN AEROBIC AND ANAEROBIC Blood Culture adequate volume Performed at [facility] [address] [place], [place] [address]    Culture   Final    NO GROWTH 3 DAYS Performed at [facility], [place] [address], [place], [place] [address]    Report Status PENDING  Incomplete  Blood Culture (routine x 2)     Status: None (Preliminary result)   Collection Time: 09/21/20  5:07 AM   Specimen: BLOOD  Result Value Ref Range Status   Specimen Description   Final    BLOOD RIGHT ANTECUBITAL Performed at [facility] [address], [place], [place] [address]    Special Requests   Final    BOTTLES DRAWN AEROBIC AND ANAEROBIC Blood Culture results may not be optimal due to an inadequate volume of blood received in culture bottles Performed at [facility] [address], [place], [place] [address]    Culture   Final    NO GROWTH 3 DAYS Performed at [facility], [place] [address] [place], [place] [address]    Report Status PENDING  Incomplete  SARS CORONAVIRUS 2 (TAT 6-24 HRS) Nasopharyngeal Nasopharyngeal Swab     Status: Abnormal   Collection Time: 09/21/20  5:34 AM   Specimen: Nasopharyngeal Swab  Result Value Ref Range Status   SARS Coronavirus 2 POSITIVE (A) NEGATIVE Final    Comment: (NOTE) SARS-CoV-2 target nucleic acids are DETECTED.  The SARS-CoV-2 RNA is generally detectable in upper and lower respiratory specimens during the acute phase of infection. Positive results are indicative of the presence of SARS-CoV-2 RNA. Clinical correlation with patient history and other diagnostic information is  necessary to determine patient infection status. Positive results do not rule out bacterial infection or co-infection with other viruses.  The expected result is Negative.  Fact  Sheet for Patients: SugarRoll.be  Fact Sheet for Healthcare Providers: https://www.woods-mathews.com/  This test is not yet approved or cleared by the [place] FDA and  has been authorized for detection and/or diagnosis of SARS-CoV-2 by FDA under an Emergency Use Authorization (EUA). This EUA will remain  in effect (meaning this test can be used) for the duration of the COVID-19 declaration under Section 564(b)(1) of the Act, 21 U. S.C. section 360bbb-3(b)(1), unless the authorization is terminated or revoked sooner.   Performed at [facility], [place] [address] [place], [place] [address]   Urine culture     Status: Abnormal   Collection Time: 09/21/20 10:39 AM   Specimen: In/Out Cath Urine  Result Value Ref Range Status   Specimen Description   Final    IN/OUT CATH URINE Performed at [facility] [address] [place], [place] [address]    Special Requests   Final    NONE Performed at [facility], [place] [address]  [place], [place] [address]    Culture MULTIPLE SPECIES PRESENT, SUGGEST RECOLLECTION (A)  Final   Report Status 09/23/2020 FINAL  Final  C Difficile Quick Screen w PCR reflex     Status: None   Collection Time: 09/21/20 11:47 AM  Result Value Ref Range Status   C Diff antigen NEGATIVE NEGATIVE Final   C Diff toxin NEGATIVE NEGATIVE Final   C Diff interpretation No C. difficile detected.  Final    Comment: Performed at [facility], [place] [address] [place], [place] [address]    Time coordinating discharge: Approximately 40 minutes  Patrecia Pour, MD  [facility] 09/24/2020, 1:21 PM

## 2020-09-24 NOTE — Plan of Care (Signed)
  Problem: Health Behavior/Discharge Planning: Goal: Ability to manage health-related needs will improve Outcome: Adequate for Discharge   Problem: Clinical Measurements: Goal: Ability to maintain clinical measurements within normal limits will improve Outcome: Adequate for Discharge Goal: Will remain free from infection Outcome: Adequate for Discharge Goal: Diagnostic test results will improve Outcome: Adequate for Discharge   Problem: Coping: Goal: Level of anxiety will decrease Outcome: Adequate for Discharge   Problem: Elimination: Goal: Will not experience complications related to bowel motility Outcome: Adequate for Discharge   Problem: Pain Managment: Goal: General experience of comfort will improve Outcome: Adequate for Discharge   Problem: Skin Integrity: Goal: Risk for impaired skin integrity will decrease Outcome: Adequate for Discharge

## 2020-09-24 NOTE — Plan of Care (Signed)
  Problem: Health Behavior/Discharge Planning: Goal: Ability to manage health-related needs will improve Outcome: Completed/Met   Problem: Clinical Measurements: Goal: Ability to maintain clinical measurements within normal limits will improve Outcome: Completed/Met Goal: Will remain free from infection Outcome: Completed/Met Goal: Diagnostic test results will improve Outcome: Completed/Met   Problem: Coping: Goal: Level of anxiety will decrease Outcome: Completed/Met   Problem: Elimination: Goal: Will not experience complications related to bowel motility Outcome: Completed/Met   Problem: Pain Managment: Goal: General experience of comfort will improve Outcome: Completed/Met   Problem: Skin Integrity: Goal: Risk for impaired skin integrity will decrease Outcome: Completed/Met

## 2020-09-25 ENCOUNTER — Telehealth: Payer: Self-pay

## 2020-09-25 LAB — TYPE AND SCREEN
ABO/RH(D): B POS
Antibody Screen: NEGATIVE
Unit division: 0

## 2020-09-25 LAB — BPAM RBC
Blood Product Expiration Date: 202201312359
Unit Type and Rh: 7300

## 2020-09-25 NOTE — Telephone Encounter (Signed)
Transition Care Management Follow-up Telephone Call  Date of discharge and from where: 1/1n/a9/22-[facility]  How have you been since you were released from the hospital? Doing ok  Any questions or concerns? No  Items Reviewed:  Did the pt receive and understand the discharge instructions provided? Yes   Medications obtained and verified? Yes   Other? Yes   Any new allergies since your discharge? No   Dietary orders reviewed? Yes  Do you have support at home? Yes   Home Care and Equipment/Supplies: Were home health services ordered? no If so, what is the name of the agency? n/a  Has the agency set up a time to come to the patient's home? not applicable Were any new equipment or medical supplies ordered?  No What is the name of the medical supply agency?n/a Were you able to get the supplies/equipment? n/a Do you have any questions related to the use of the equipment or supplies? n/a  Functional Questionnaire: (I = Independent and D = Dependent) ADLs: I  Bathing/Dressing- I  Meal Prep- I  Eating- I  Maintaining continence- I  Transferring/Ambulation- I  Managing Meds- I  Follow up appointments reviewed:   PCP Hospital f/u appt confirmed? Yes  Scheduled to see Dr. Etter Sjogren on 10/07/20 @ 11:20.  [facility] f/u appt confirmed? N/A   Are transportation arrangements needed? No   If their condition worsens, is the pt aware to call PCP or go to the Emergency Dept.? Yes  Was the patient provided with contact information for the PCP's office or ED? Yes  Was to pt encouraged to call back with questions or concerns? Yes

## 2020-09-26 ENCOUNTER — Ambulatory Visit: Payer: Medicare Other | Admitting: Gastroenterology

## 2020-09-26 LAB — CULTURE, BLOOD (SINGLE)
Culture: NO GROWTH
Special Requests: ADEQUATE

## 2020-09-26 LAB — CULTURE, BLOOD (ROUTINE X 2): Culture: NO GROWTH

## 2020-09-29 ENCOUNTER — Other Ambulatory Visit: Payer: Self-pay

## 2020-09-29 ENCOUNTER — Other Ambulatory Visit: Payer: Self-pay | Admitting: Gastroenterology

## 2020-09-29 MED ORDER — FAMOTIDINE 20 MG PO TABS: 20.0000 mg | ORAL_TABLET | Freq: Two times a day (BID) | ORAL | 1 refills | Status: DC

## 2020-09-30 ENCOUNTER — Telehealth: Payer: Self-pay | Admitting: Gastroenterology

## 2020-09-30 MED ORDER — FAMOTIDINE 20 MG PO TABS: 20.0000 mg | ORAL_TABLET | Freq: Two times a day (BID) | ORAL | 1 refills | Status: DC

## 2020-09-30 NOTE — Telephone Encounter (Signed)
Spoke with patient, she states that she was in the hospital last week, she stated that  everything has been fine since she left but last night around 2 am she began to have diarrhea again, she denies any new medications, no abdominal pain. Patient has not taken any Imodium or fiber - she states that they did not tell her that she could resume her fiber. Patient was tested for COVID while in the hospital and was positive, looks like diarrhea was likely secondary to [place]. Patient states that she would not like for her bowel movements to get back bad as they previously were. Please advise, thank you.

## 2020-09-30 NOTE — Addendum Note (Signed)
Addended by: Roetta Sessions on: 09/30/2020 10:09 AM   Modules accepted: Orders

## 2020-09-30 NOTE — Telephone Encounter (Signed)
Looks like she just tested negative for C Diff recently. I think okay to resume her fiber if she wants to, and to use immodium PRN. If not helping or worsening she should let us know. Once she recovers from [place] she is due for a follow up visit as well. Thanks

## 2020-09-30 NOTE — Telephone Encounter (Signed)
Spoke with patient, advised that she may resume fiber supplement if she wants, she is also aware that she may use Imodium as needed. Advised patient if her symptoms are worsening or the medicine and supplement are not helping to give [place] a call back. Patient will be complete with quarantine on 10/01/20. Patient is scheduled for a follow up on Friday, 10/03/20 at 8:10 AM. Patient verbalized understanding of all information and had no concerns at the end of the call.

## 2020-09-30 NOTE — Progress Notes (Signed)
Resent script - due to e-scribe error

## 2020-10-03 ENCOUNTER — Encounter: Payer: Self-pay | Admitting: Gastroenterology

## 2020-10-03 ENCOUNTER — Ambulatory Visit (INDEPENDENT_AMBULATORY_CARE_PROVIDER_SITE_OTHER): Payer: Medicare Other | Admitting: Gastroenterology

## 2020-10-03 VITALS — BP 172/100 | HR 80 | Ht 63.0 in | Wt 196.0 lb

## 2020-10-03 DIAGNOSIS — Z9049 Acquired absence of other specified parts of digestive tract: Secondary | ICD-10-CM | POA: Diagnosis not present

## 2020-10-03 DIAGNOSIS — K91850 Pouchitis: Secondary | ICD-10-CM | POA: Diagnosis not present

## 2020-10-03 DIAGNOSIS — Z8619 Personal history of other infectious and parasitic diseases: Secondary | ICD-10-CM | POA: Diagnosis not present

## 2020-10-03 DIAGNOSIS — K50818 Crohn's disease of both small and large intestine with other complication: Secondary | ICD-10-CM

## 2020-10-03 MED ORDER — CITRUCEL PO POWD: ORAL | Status: DC

## 2020-10-03 NOTE — Progress Notes (Signed)
HPI :  IBD history: 65 year old white female with IBD ( Crohn's) since 13. 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 She was never able to be put in remission prior to colectomy. Shewasonly exposed to steroids prior to her surgery. Post-operatively, she was on Remicade for 3-4 years butdid not helptoo much. She hadbeen on 6MP remotely but did not work for her. She was on Humira but also did not put her into remission and she developed high antibody titers and it was stopped. Most recentlyon Entyvio and low dose 6MP. 6MP was stopped due to worsening anemia.She reports multiple courses of antibiotics with some benefit historically. She has been on budesonider with ? Relief. Father and daughter have UC. She has seen Jeanne Ivan and Dr. 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  History as above.  She has been maintained on Entyvio every 4 weeks due to a prior level of 4.8 without antibodies a few years ago.  She has home infusions of Entyvio and is very happy with this regimen.  In general she thinks has helped her but she has had ongoing complications with chronic symptoms to some extent.  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  A subsequent MRI of the pelvis showed a complex fistulous tract.  She was seen by Dr. Larose Hires of [facility] who referred her back to Dr. Almond Lint.  Dr. Sheryn Bison has recommended pouch excision with end ileostomy.  The patient states he thinks he may be ready for that however her mother and husband really do not want her to have any surgery for this.  She has chronic fecal leakage and wears diapers.  She had an EUA with dilation of rectal stenosis per Dr. Sheryn Bison the last time they met she states her leakage has been  much worse since then.  I have cycled her on antibiotics for pouchitis in the past.  She was admitted to the hospital in December for C. Difficile for 3 to 4 days.  She recovered from that but ultimately had worsening diarrhea and readmitted in January 16-19.  She was thought to be septic, had an AKI, was diagnosed with COVID-19.  She had negative stool testing for C. difficile at that time.  She was treated with supportive care and eventually improved.  Today she states she is doing okay.  She states some days she actually feels pretty well and has some soft stools and then other days she has much looser stools with leakage.  She does take Imodium when her symptoms are a bit worse.  She denies any abdominal pains at a been bothering her.  No perianal abscesses or drainage from the fistula that she endorses.  She states she does not trust Dr. Sheryn Bison right now and is quite unhappy that she has worsening leakage due to rectal dilation at the last visit with her.  She states emphatically that she will never have surgery at [facility].  She continues to get Entyvio every 4 weeks, she inquires about home IV fluid administration as needed if she is getting dehydrated.  Since her hospitalization she states she has been doing pretty well and stable right now.  We discussed long-term options   Prior workup: Pouchoscopy 03/31/17 - anal  fissure, mild anal canal stenosis, focal ulceration of pouch and more proximal ileum but improved compared to previous  Pouchoscopy 01/21/20 -  Preparation of the colon was poor. - A suspected small perianal fistulous tract and anal canal stenosis found on digital rectal exam. - The ileal pouch has an angulated turn with very focal ulcerations, otherwise no inflammatory changes noted. biopsies obtained - Small area of nodularity along anal canal - biopsied to rule out AIN / condyloma - Suspected fistal of the distal pouch just proximal to anal canal Overall, only one focal  ulceration noted and no other inflammatory changes seen. Incidentally noted suspected new fistula   EGD 01/21/20 -  - A 3 cm hiatal hernia was present. - LA Grade A esophagitis was found 33 cm from the incisors - mild. - The exam of the esophagus was otherwise normal. No Barrett's esophagus. - Multiple small sessile polyps were found in the gastric fundus and in the gastric body, benign appearing, likely fundic gland polyps, a cluster was noted at the distal end of the hernia sac. Biopsies were taken with a cold forceps for histology. - The exam of the stomach was otherwise normal. - The duodenal bulb and second portion of the duodenum were normal.  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  Cellulitis extends superiorly throughout the posterior subcutaneous fat, adjacent to the gluteal and posterior paraspinal muscles.  No evidence of abscess.   MRE 06/25/20 - IMPRESSION: 1. Persistent inflammatory changes near the upper gluteal cleft bilaterally between the gluteus maximus muscles. No discrete drainable soft tissue abscess or intramuscular involvement. 2. No MR findings for acute inflammatory small-bowel disease. However, study is limited without contrast.   Past Medical History:  Diagnosis Date  . Abdominal pain    Hx  . Allergy   . Anal stenosis   . Anemia   . Anxiety   . Arthritis   . Asthma    patient does not have inhaler  . Blood in stool    Hx  . Blood in urine    Hx  . Blood transfusion without reported diagnosis   . Cataract   . CKD (chronic kidney disease) stage 3, GFR 30-59 ml/min (HCC)   . Crohn's colitis, other complication ([place])   . De Quervain's tenosynovitis   . Depression   . Difficulty urinating    Hx  . Easy bruising   . Esophagitis   . Fainting    History - resolved - due to  dehydration  . Fatigue    Hx  . Fibroid    Hx  . Gastric polyp   . GERD (gastroesophageal reflux disease)   . 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 Hemorrhoids, external   . Hemorrhoids, internal   . Herpes, genital    vaginal treated 07/05/12 and pt states is resolved  . History of cervical dysplasia   . History of small bowel obstruction   . Hyperlipidemia    currently no meds  . Hyperparathyroidism   . Hypertension   . Hypokalemia    Hx  . 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 Obesity   . Ovarian cyst   . Poor dental hygiene   . Pulmonary  nodule, right    right upper lobe  . Rectal bleeding    Hx  . Rectal pain    Hx  . Renal insufficiency    CKD - stage 3  . RLS (restless legs syndrome)    no meds  . Tooth infection 11/2016   right low  . Ulcerative colitis   . Visual disturbance    wears glasses  . Weakness generalized    Hx - patient denies generalized weakness  . Wears dentures    upper only     Past Surgical History:  Procedure Laterality Date  . ANAL DILATION    . CERVICAL BIOPSY  W/ LOOP ELECTRODE EXCISION    . CHOLECYSTECTOMY    . COLONOSCOPY     [name]  . fatty tumor removed from back     X 2  . HEMORRHOID SURGERY    . ILEOSTOMY CLOSURE    . RESTORATIVE PROCTOCOLECTOMY     with insertion of ileoanal J Pouch with loop ileostomy  . SHOULDER ARTHROSCOPY WITH CAPSULORRHAPHY Left 06/14/2019   Procedure: LEFT SHOULDER ARTHRSCOPIC REPAIR OF BONY BANKART FRACTURE;  Surgeon: Tania Ade, MD;  Location: [facility];  Service: Orthopedics;  Laterality: Left;  REQUEST 90 MINUTE  . SIGMOIDOSCOPY    . TOTAL ABDOMINAL HYSTERECTOMY  1998   TAH/LSO  . TUBAL LIGATION    . UPPER GASTROINTESTINAL ENDOSCOPY     [name]   Family History  Problem Relation Age of Onset  . Ulcerative colitis Father   . Hypertension Father   . Heart attack Father    . Hypertension Mother   . Heart disease Mother        s/p pci  . Ulcerative colitis Daughter   . Irritable bowel syndrome Other        grandchildren  . Diabetes Sister   . Cancer Sister        uterine  . Cancer Maternal Uncle        LUNG  . Colon cancer Neg Hx   . Esophageal cancer Neg Hx   . Stomach cancer Neg Hx   . Rectal cancer Neg Hx    Social History   Tobacco Use  . Smoking status: Former Smoker    Packs/day: 1.00    Years: 4.00    Pack years: 4.00    Types: Cigarettes    Quit date: 05/21/1979    Years since quitting: 41.4  . Smokeless tobacco: Never Used  Vaping Use  . Vaping Use: Never used  Substance Use Topics  . Alcohol use: No    Alcohol/week: 0.0 standard drinks  . Drug use: No   Current Outpatient Medications  Medication Sig Dispense Refill  . allopurinol (ZYLOPRIM) 300 MG tablet TAKE 1 TABLET BY MOUTH EVERY DAY (Patient taking differently: Take 300 mg by mouth daily.) 90 tablet 0  . cholecalciferol (VITAMIN D) 25 MCG (1000 UNIT) tablet Take 1,000 Units by mouth daily.    . DULoxetine (CYMBALTA) 60 MG capsule Take 2 capsules (120 mg total) by mouth daily. 180 capsule 3  . famotidine (PEPCID) 20 MG tablet Take 1 tablet (20 mg total) by mouth 2 (two) times daily. 974 tablet 1  . folic acid (FOLVITE) 1 MG tablet Take 2 tablets (2 mg total) by mouth daily. (Patient taking differently: Take 1 mg by mouth daily.) 180 tablet 3  . gabapentin (NEURONTIN) 100 MG capsule TAKE 1 CAPSULE BY MOUTH THREE TIMES A DAY (Patient taking differently: Take 100 mg by mouth  2 (two) times daily.) 90 capsule 3  . metoprolol succinate (TOPROL-XL) 100 MG 24 hr tablet Take 50-100 mg by mouth See admin instructions. Take 100 mg in the a.m. and 50 mg in the evening    . vedolizumab (ENTYVIO) 300 MG injection Inject 300 mg into the vein every 30 (thirty) days.    . Vitamin D, Ergocalciferol, (DRISDOL) 1.25 MG (50000 UNIT) CAPS capsule Take 50,000 Units by mouth every 30 (thirty) days.      No current facility-administered medications for this visit.   Allergies  Allergen Reactions  . Sulfa Antibiotics Hives  . Morphine Other (See Comments)    Gives me crazy dreams Gives me crazy dreams  . Sulfonamide Derivatives Itching, Swelling and Rash     Review of Systems: All systems reviewed and negative except where noted in HPI.    Labs reviewed in Epic  Physical Exam: BP (!) 172/100 (BP Location: Left Arm, Patient Position: Sitting, Cuff Size: Normal)   Pulse 80   Ht 5' 3"  (1.6 m) Comment: height measured without shoes  Wt 196 lb (88.9 kg)   BMI 34.72 kg/m  Constitutional: Pleasant,well-developed, female in no acute distress. Abdominal: Soft, nondistended, nontender.  There are no masses palpable.  Extremities: no edema Lymphadenopathy: No cervical adenopathy noted. Neurological: Alert and oriented to person place and time. Skin: Skin is warm and dry. No rashes noted. Psychiatric: Normal mood and affect. Behavior is normal.   ASSESSMENT AND PLAN: 65 year old female here for reassessment of the following issues:  Crohn's disease Chronic pouchitis Pouch dysfunction History of C Diff  History as above.  Complex history with suspected Crohn's of the pouch with likely component of chronic pouchitis, rectal stenosis, perianal fistulas, and pouch dysfunction.  She has failed multiple biologic regimens as outlined above.  On Entyvio which has helped her since she has been on it but clearly not keeping things controlled, currently on dosing every 4 weeks.  Discussed options with her.  She has seen colorectal surgery at [facility] who has recommended an end ileostomy and I think that may be her best option given the leakage she has from her rectal area, perianal fistulas, and frequent diarrhea.  I think end ileostomy might provide a better quality of life.  She is thinking about this but will not have it done at [facility] and would need another opinion about this (based on her last  visit her).  Her mother and husband do not want her to have surgery, I am happy to chat with them about it if needed, the patient I think is preparing herself to have this done.  Regarding medical options, I am going to check her Entyvio levels to see if these are therapeutic and ensure she does not develop resistance to it.  If so, her other medical option would be Stelara but I am not confident this is going to put her in remission.  I will also check QuantiFERON gold that she is due for this, recheck her renal function to ensure stable, check iron studies, B12, and vitamin D.  Fiber supplementation may help provide some more bulk to her stool, will recommend some Citrucel daily and she can use Imodium as needed.  I have offered her a trial of pelvic floor PT in light of her fecal leakage and she is declined this.  I am going to discuss her case at our next multidisciplinary IBD conference to see if she would want to see one of the surgeons locally for  this if she wishes to pursue surgical therapy or get another consultation at tertiary care facility such as Dr. Drue Flirt at [facility].  She is inquiring about home IV fluid infusion, if the company that provides her home Entyvio infusions can do this we can consider it although that might be difficult to coordinate on an as-needed basis.    Plan: - labs - Entyvio trough level and antibody level, BMET, TIBC, B12, vitamin D, quantiferon gold - Citrucel daily - immodium PRN - recommended pelvic floor PT, she declined - will present her case at multidisciplinary IBD conference - follow up with colorectal surgery if she is going to consider end ileostomy - either with [facility] or Dr. Sharyne Peach - follow up in the office in 3 months  [place]port Cellar, MD [facility]

## 2020-10-03 NOTE — Patient Instructions (Addendum)
If you are age 67 or older, your body mass index should be between 23-30. Your Body mass index is 34.72 kg/m. If this is out of the aforementioned range listed, please consider follow up with your Primary Care Provider.  If you are age 5 or younger, your body mass index should be between 19-25. Your Body mass index is 34.72 kg/m. If this is out of the aformentioned range listed, please consider follow up with your Primary Care Provider.   Please go to the lab in the basement of our building to have lab work done the day before or the morning of your next Entyvio infusion next week. Hit "B" for basement when you get on the elevator.  When the doors open the lab is on your left.  We will call you with the results. Thank you.  Please purchase the following medications over the counter and take as directed: Citrucel:  Take once daily  Imodium- Take as needed    Thank you for entrusting me with your care and for choosing [facility], Dr. Elwood Cellar

## 2020-10-07 ENCOUNTER — Inpatient Hospital Stay: Payer: Medicare Other | Admitting: Family Medicine

## 2020-10-07 ENCOUNTER — Telehealth: Payer: Self-pay

## 2020-10-07 NOTE — Telephone Encounter (Signed)
Received fax from [facility] in regards to patient's [facility] prescription, order was placed in Dr. Doyne Keel IN box for review. Will fax once complete.

## 2020-10-07 NOTE — Telephone Encounter (Signed)
I completed the form, it is signed on my desk. Thanks [employer]

## 2020-10-07 NOTE — Telephone Encounter (Signed)
Refill request form for [name] has been faxed to [facility] services at [phone].   Home health certification and plan of care have been faxed back to Tomasa Hosteller, RN at [place] [phone]).

## 2020-10-08 ENCOUNTER — Other Ambulatory Visit (INDEPENDENT_AMBULATORY_CARE_PROVIDER_SITE_OTHER): Payer: Medicare Other

## 2020-10-08 DIAGNOSIS — K50818 Crohn's disease of both small and large intestine with other complication: Secondary | ICD-10-CM

## 2020-10-08 DIAGNOSIS — K91850 Pouchitis: Secondary | ICD-10-CM

## 2020-10-08 DIAGNOSIS — Z9049 Acquired absence of other specified parts of digestive tract: Secondary | ICD-10-CM | POA: Diagnosis not present

## 2020-10-08 DIAGNOSIS — Z8619 Personal history of other infectious and parasitic diseases: Secondary | ICD-10-CM | POA: Diagnosis not present

## 2020-10-08 LAB — VITAMIN D 25 HYDROXY (VIT D DEFICIENCY, FRACTURES): VITD: 32.51 ng/mL (ref 30.00–100.00)

## 2020-10-08 LAB — BASIC METABOLIC PANEL
BUN: 34 mg/dL — ABNORMAL HIGH (ref 6–23)
CO2: 26 mEq/L (ref 19–32)
Calcium: 9.2 mg/dL (ref 8.4–10.5)
Chloride: 103 mEq/L (ref 96–112)
Creatinine, Ser: 1.99 mg/dL — ABNORMAL HIGH (ref 0.40–1.20)
GFR: 25.65 mL/min — ABNORMAL LOW (ref >60.00)
Glucose, Bld: 89 mg/dL (ref 70–99)
Potassium: 3.7 mEq/L (ref 3.5–5.1)
Sodium: 137 mEq/L (ref 135–145)

## 2020-10-08 LAB — VITAMIN B12: Vitamin B-12: 502 pg/mL (ref 211–911)

## 2020-10-09 DIAGNOSIS — K5100 Ulcerative (chronic) pancolitis without complications: Secondary | ICD-10-CM | POA: Diagnosis not present

## 2020-10-10 LAB — QUANTIFERON-TB GOLD PLUS
Mitogen-NIL: >10 IU/mL
NIL: 0.03 IU/mL
QuantiFERON-TB Gold Plus: NEGATIVE
TB1-NIL: 0.01 IU/mL
TB2-NIL: 0 IU/mL

## 2020-10-19 LAB — SERIAL MONITORING

## 2020-10-20 LAB — IRON AND TIBC
Iron Saturation: 18 % (ref 15–55)
Iron: 51 ug/dL (ref 27–139)
Total Iron Binding Capacity: 284 ug/dL (ref 250–450)
UIBC: 233 ug/dL (ref 118–369)

## 2020-10-20 LAB — VEDOLIZUMAB AND ANTI-VEDO AB
Anti-Vedolizumab Antibody: <25 ng/mL
Vedolizumab: 30 ug/mL

## 2020-10-22 ENCOUNTER — Other Ambulatory Visit: Payer: Self-pay | Admitting: Gastroenterology

## 2020-10-23 ENCOUNTER — Encounter: Payer: Self-pay | Admitting: Medical

## 2020-10-23 ENCOUNTER — Other Ambulatory Visit: Payer: Self-pay

## 2020-10-23 ENCOUNTER — Ambulatory Visit (INDEPENDENT_AMBULATORY_CARE_PROVIDER_SITE_OTHER): Payer: Medicare Other | Admitting: Medical

## 2020-10-23 ENCOUNTER — Ambulatory Visit (HOSPITAL_BASED_OUTPATIENT_CLINIC_OR_DEPARTMENT_OTHER)
Admission: RE | Admit: 2020-10-23 | Discharge: 2020-10-23 | Disposition: A | Discharge status: Home / Self Care | Payer: Medicare Other | Source: Ambulatory Visit | Attending: Medical | Admitting: Medical

## 2020-10-23 VITALS — BP 158/80 | HR 86 | Temp 98.7°F | Resp 16 | Ht 63.0 in | Wt 197.2 lb

## 2020-10-23 DIAGNOSIS — I10 Essential (primary) hypertension: Secondary | ICD-10-CM

## 2020-10-23 DIAGNOSIS — M25562 Pain in left knee: Secondary | ICD-10-CM

## 2020-10-23 DIAGNOSIS — N184 Chronic kidney disease, stage 4 (severe): Secondary | ICD-10-CM

## 2020-10-23 LAB — COMPREHENSIVE METABOLIC PANEL
ALT: 12 U/L (ref 0–35)
AST: 14 U/L (ref 0–37)
Albumin: 3.9 g/dL (ref 3.5–5.2)
Alkaline Phosphatase: 88 U/L (ref 39–117)
BUN: 32 mg/dL — ABNORMAL HIGH (ref 6–23)
CO2: 24 mEq/L (ref 19–32)
Calcium: 9.1 mg/dL (ref 8.4–10.5)
Chloride: 104 mEq/L (ref 96–112)
Creatinine, Ser: 2.14 mg/dL — ABNORMAL HIGH (ref 0.40–1.20)
GFR: 23.5 mL/min — ABNORMAL LOW (ref >60.00)
Glucose, Bld: 88 mg/dL (ref 70–99)
Potassium: 4.2 mEq/L (ref 3.5–5.1)
Sodium: 137 mEq/L (ref 135–145)
Total Bilirubin: 0.3 mg/dL (ref 0.2–1.2)
Total Protein: 7.2 g/dL (ref 6.0–8.3)

## 2020-10-23 IMAGING — DX DG KNEE 3 VIEWS*L*
3 series · 3 of 3 positions shown · non-contrast
Comparison: None.

CLINICAL DATA: Left knee pain since fall 2 weeks ago.

EXAM:
LEFT KNEE - 3 VIEW

[knee ap]
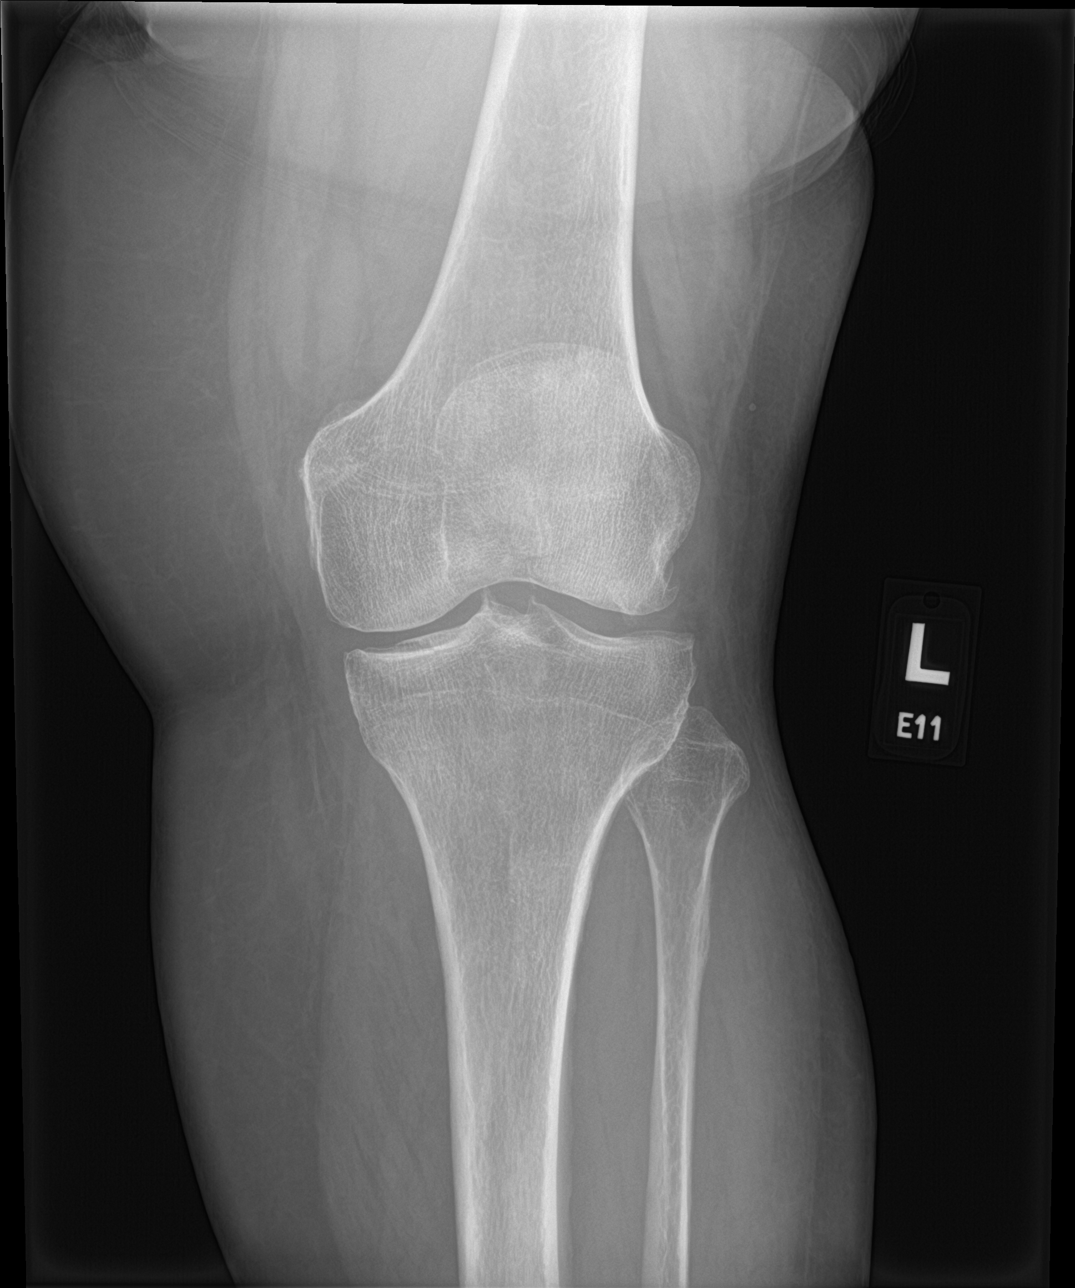

[knee lat]
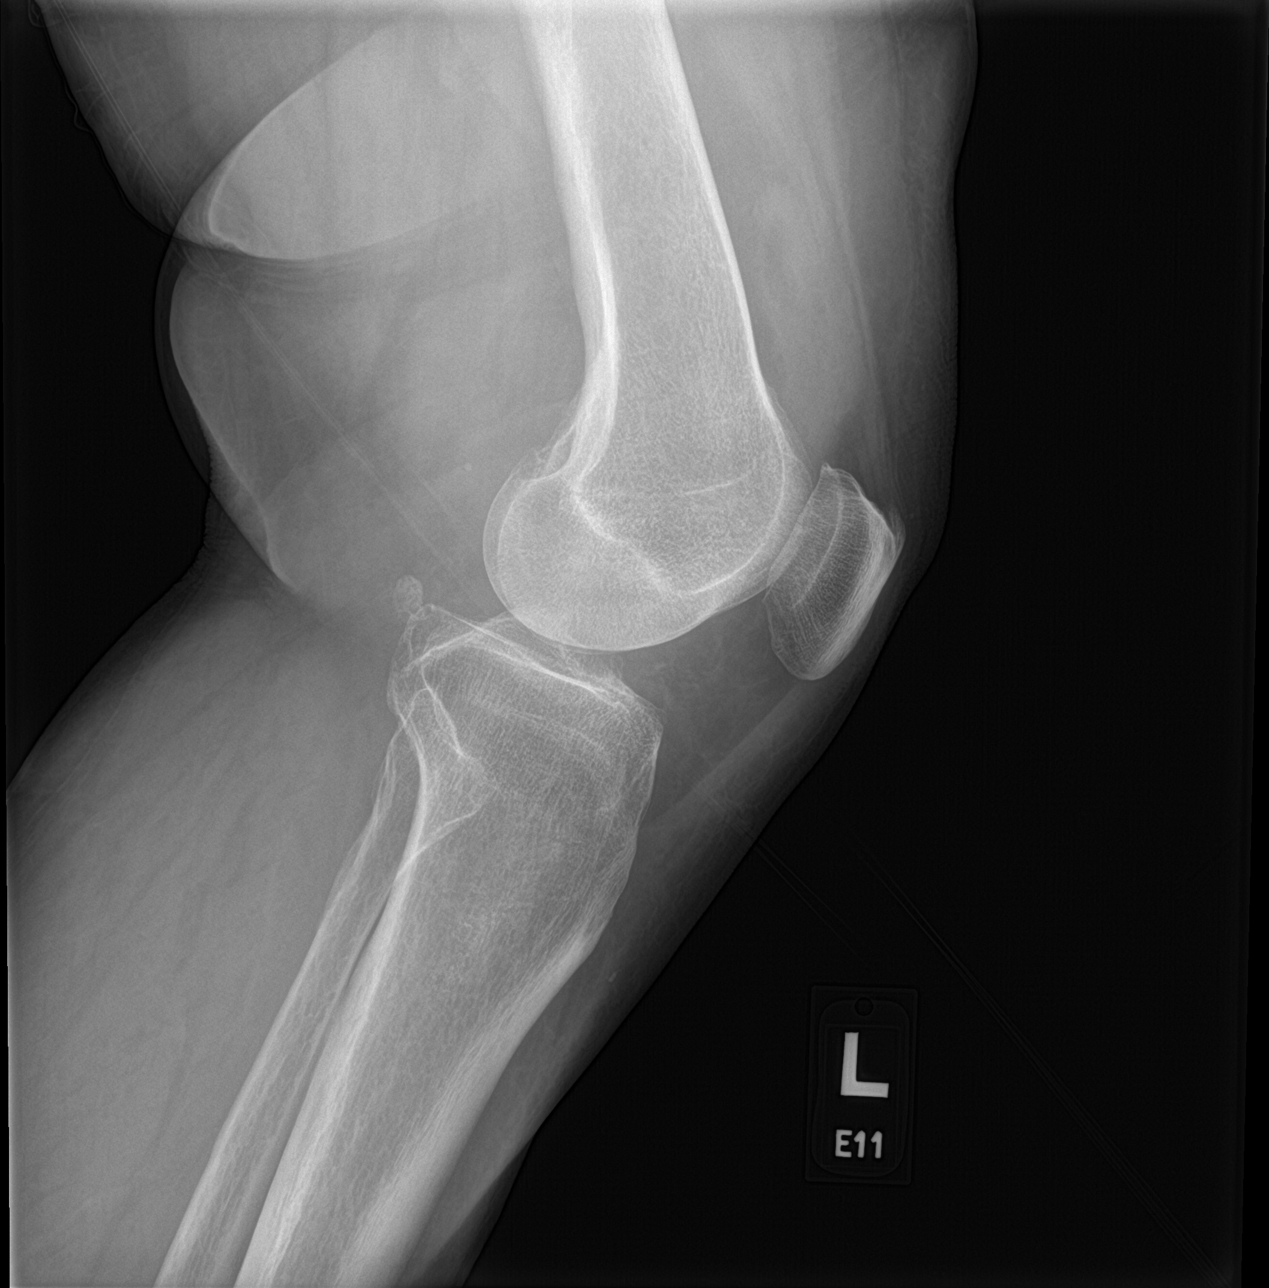

[knee sunrise]
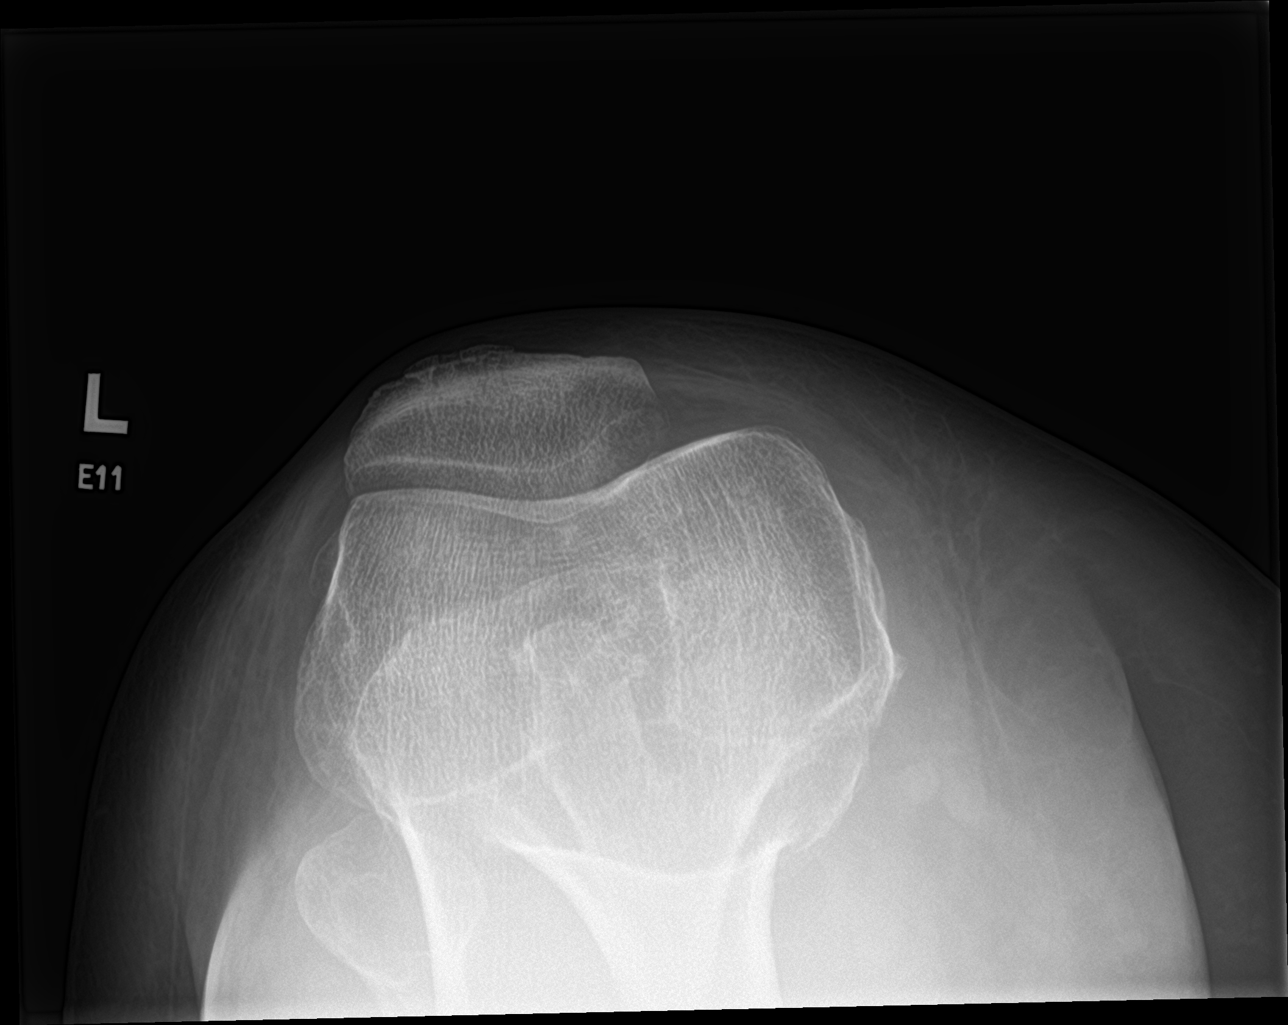

[3 of 3 positions shown; findings below may reference images not displayed]

FINDINGS: No acute fracture or dislocation. Small to moderate joint effusion.
Small lateral compartment marginal osteophytes. Joint spaces are
preserved. Bone mineralization is normal. Soft tissues are
unremarkable.
IMPRESSION: 1. No acute osseous abnormality. Small to moderate joint effusion.

## 2020-10-23 NOTE — Patient Instructions (Signed)
For left knee pain will get xray to see if have fracture. For pain presently can use tylenol. If fracture consider narcotic. If no fracture might use low dose prednisone as tendinitis pain possible. Also might refer to sports medicine after xray review.  Your bp is elevated today. I think you need to restart hydralazine. If you would call nephrologist today to get opinion on dosing as he stopped med. If no response  Within 24 hours let me know.  For ckd get cmp today to check gfr.  Follow up 10-14 days or as needed
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
## 2020-10-23 NOTE — Addendum Note (Signed)
Addended by: Anabel Halon on: 10/23/2020 03:22 PM   Modules accepted: Orders

## 2020-10-24 DIAGNOSIS — E876 Hypokalemia: Secondary | ICD-10-CM | POA: Diagnosis not present

## 2020-10-24 DIAGNOSIS — M109 Gout, unspecified: Secondary | ICD-10-CM | POA: Diagnosis not present

## 2020-10-24 DIAGNOSIS — I129 Hypertensive chronic kidney disease with stage 1 through stage 4 chronic kidney disease, or unspecified chronic kidney disease: Secondary | ICD-10-CM | POA: Diagnosis not present

## 2020-10-24 DIAGNOSIS — N179 Acute kidney failure, unspecified: Secondary | ICD-10-CM | POA: Diagnosis not present

## 2020-10-24 DIAGNOSIS — N2581 Secondary hyperparathyroidism of renal origin: Secondary | ICD-10-CM | POA: Diagnosis not present

## 2020-10-24 DIAGNOSIS — Z8616 Personal history of COVID-19: Secondary | ICD-10-CM | POA: Diagnosis not present

## 2020-10-24 DIAGNOSIS — R768 Other specified abnormal immunological findings in serum: Secondary | ICD-10-CM | POA: Diagnosis not present

## 2020-10-24 DIAGNOSIS — D631 Anemia in chronic kidney disease: Secondary | ICD-10-CM | POA: Diagnosis not present

## 2020-10-24 DIAGNOSIS — N184 Chronic kidney disease, stage 4 (severe): Secondary | ICD-10-CM | POA: Diagnosis not present

## 2020-10-24 DIAGNOSIS — D509 Iron deficiency anemia, unspecified: Secondary | ICD-10-CM | POA: Diagnosis not present

## 2020-10-29 ENCOUNTER — Telehealth: Payer: Self-pay | Admitting: Gastroenterology

## 2020-10-29 NOTE — Telephone Encounter (Signed)
Patient returned your call about results, please call patient one more time.

## 2020-10-29 NOTE — Telephone Encounter (Signed)
Lm on vm for patient to return call 

## 2020-10-30 ENCOUNTER — Other Ambulatory Visit: Payer: Self-pay

## 2020-10-30 ENCOUNTER — Ambulatory Visit (HOSPITAL_BASED_OUTPATIENT_CLINIC_OR_DEPARTMENT_OTHER)
Admission: RE | Admit: 2020-10-30 | Discharge: 2020-10-30 | Disposition: A | Discharge status: Home / Self Care | Payer: Medicare Other | Source: Ambulatory Visit | Attending: Family Medicine | Admitting: Family Medicine

## 2020-10-30 ENCOUNTER — Ambulatory Visit: Payer: Self-pay

## 2020-10-30 ENCOUNTER — Ambulatory Visit: Payer: Medicare Other | Admitting: Family Medicine

## 2020-10-30 VITALS — BP 138/90 | Ht 63.0 in | Wt 197.0 lb

## 2020-10-30 DIAGNOSIS — K5090 Crohn's disease, unspecified, without complications: Secondary | ICD-10-CM | POA: Insufficient documentation

## 2020-10-30 DIAGNOSIS — X58XXXA Exposure to other specified factors, initial encounter: Secondary | ICD-10-CM | POA: Diagnosis not present

## 2020-10-30 DIAGNOSIS — S82142A Displaced bicondylar fracture of left tibia, initial encounter for closed fracture: Secondary | ICD-10-CM | POA: Diagnosis not present

## 2020-10-30 DIAGNOSIS — Z13820 Encounter for screening for osteoporosis: Secondary | ICD-10-CM | POA: Insufficient documentation

## 2020-10-30 DIAGNOSIS — Z780 Asymptomatic menopausal state: Secondary | ICD-10-CM | POA: Insufficient documentation

## 2020-10-30 DIAGNOSIS — M85852 Other specified disorders of bone density and structure, left thigh: Secondary | ICD-10-CM | POA: Diagnosis not present

## 2020-10-30 DIAGNOSIS — M25562 Pain in left knee: Secondary | ICD-10-CM

## 2020-10-30 NOTE — Assessment & Plan Note (Signed)
X-ray was unrevealing but ultrasound demonstrates significant change around the tibial plateau to suggest fracture given recent trauma. -Counseled on home exercise therapy and supportive care. -Hinged knee brace. -Counseled on limited weightbearing on the left leg. -Could consider further imaging or physical therapy.

## 2020-10-30 NOTE — Patient Instructions (Signed)
Good to see you Please try the brace  Please avoid putting weight on the left leg   I will call with the results of the bone density  Please send me a message in MyChart with any questions or updates.  Please see me back in 2-3 weeks.   --Dr. Raeford Razor

## 2020-10-30 NOTE — Telephone Encounter (Signed)
Lm on vm for patient to return call 

## 2020-10-30 NOTE — Progress Notes (Signed)
[name] - 65 y.o. female MRN [id number]  Date of birth: 1955-10-16  SUBJECTIVE:  Including CC & ROS.  No chief complaint on file.   [name] is a 65 y.o. female that is presenting with pain of the proximal tibia.  She had a fall and landed on a cinder block.  Since that time she has had severe pain over the proximal portion of the tibia.  No swelling of the knee.  Pain is worse with weightbearing.  Trauma occurred roughly 3 weeks ago..  Independent review of the left knee x-ray from 2/17 shows minimal joint space narrowing possible effusion.   Review of Systems See HPI   HISTORY: Past Medical, Surgical, Social, and Family History Reviewed & Updated per EMR.   Pertinent Historical Findings include:  Past Medical History:  Diagnosis Date  . Abdominal pain    Hx  . Allergy   . Anal stenosis   . Anemia   . Anxiety   . Arthritis   . Asthma    patient does not have inhaler  . Blood in stool    Hx  . Blood in urine    Hx  . Blood transfusion without reported diagnosis   . Cataract   . CKD (chronic kidney disease) stage 3, GFR 30-59 ml/min (HCC)   . Crohn's colitis, other complication ([place])   . De Quervain's tenosynovitis   . Depression   . Difficulty urinating    Hx  . Easy bruising   . Esophagitis   . Fainting    History - resolved - due to dehydration  . Fatigue    Hx  . Fibroid    Hx  . Gastric polyp   . GERD (gastroesophageal reflux disease)   . 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 Hemorrhoids, external   . Hemorrhoids, internal   . Herpes, genital    vaginal treated 07/05/12 and pt states is resolved  . History of cervical dysplasia   . History of small bowel obstruction   . Hyperlipidemia    currently no meds  . Hyperparathyroidism   . Hypertension   . Hypokalemia    Hx  . 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 Obesity   .  Ovarian cyst   . Poor dental hygiene   . Pulmonary nodule, right    right upper lobe  . Rectal bleeding    Hx  . Rectal pain    Hx  . Renal insufficiency    CKD - stage 3  . RLS (restless legs syndrome)    no meds  . Tooth infection 11/2016   right low  . Ulcerative colitis   . Visual disturbance    wears glasses  . Weakness generalized    Hx - patient denies generalized weakness  . Wears dentures    upper only    Past Surgical History:  Procedure Laterality Date  . ANAL DILATION    . CERVICAL BIOPSY  W/ LOOP ELECTRODE EXCISION    . CHOLECYSTECTOMY    . COLONOSCOPY     [name]  . fatty tumor removed from back     X 2  . HEMORRHOID SURGERY    . ILEOSTOMY CLOSURE    . RESTORATIVE PROCTOCOLECTOMY     with insertion of ileoanal J Pouch with loop ileostomy  .  SHOULDER ARTHROSCOPY WITH CAPSULORRHAPHY Left 06/14/2019   Procedure: LEFT SHOULDER ARTHRSCOPIC REPAIR OF BONY BANKART FRACTURE;  Surgeon: Tania Ade, MD;  Location: [facility];  Service: Orthopedics;  Laterality: Left;  REQUEST 90 MINUTE  . SIGMOIDOSCOPY    . TOTAL ABDOMINAL HYSTERECTOMY  1998   TAH/LSO  . TUBAL LIGATION    . UPPER GASTROINTESTINAL ENDOSCOPY     [name]    Family History  Problem Relation Age of Onset  . Ulcerative colitis Father   . Hypertension Father   . Heart attack Father   . Hypertension Mother   . Heart disease Mother        s/p pci  . Ulcerative colitis Daughter   . Irritable bowel syndrome Other        grandchildren  . Diabetes Sister   . Cancer Sister        uterine  . Cancer Maternal Uncle        LUNG  . Colon cancer Neg Hx   . Esophageal cancer Neg Hx   . Stomach cancer Neg Hx   . Rectal cancer Neg Hx     Social History   Socioeconomic History  . Marital status: Divorced    Spouse name: Not on file  . Number of children: Not on file  . Years of education: Not on file  . Highest education level: Not on file  Occupational History  . Not on file  Tobacco Use  .  Smoking status: Former Smoker    Packs/day: 1.00    Years: 4.00    Pack years: 4.00    Types: Cigarettes    Quit date: 05/21/1979    Years since quitting: 41.4  . Smokeless tobacco: Never Used  Vaping Use  . Vaping Use: Never used  Substance and Sexual Activity  . Alcohol use: No    Alcohol/week: 0.0 standard drinks  . Drug use: No  . Sexual activity: Yes    Birth control/protection: Post-menopausal    Comment: 1st intercourse 65 yo-Fewer than 5 partners  Other Topics Concern  . Not on file  Social History Narrative  . Not on file   Social Determinants of Health   Financial Resource Strain: Not on file  Food Insecurity: Not on file  Transportation Needs: Not on file  Physical Activity: Not on file  Stress: Not on file  Social Connections: Not on file  Intimate Partner Violence: Not on file     PHYSICAL EXAM:  VS: BP 138/90 (BP Location: Left Arm, Patient Position: Sitting, Cuff Size: Large)   Ht 5' 3"  (1.6 m)   Wt 197 lb (89.4 kg)   BMI 34.90 kg/m  Physical Exam Gen: NAD, alert, cooperative with exam, well-appearing MSK:  Left knee: No obvious effusion. Normal range of motion. No ecchymosis or swelling. Tenderness palpation of the proximal tibia. Neurovascularly intact  Limited ultrasound: Left knee:  No significant effusion. Normal-appearing quadricep patellar tendon. Mild degenerative changes of the medial meniscus Extreme hyperemia over the proximal tibial plateau.    Summary: Findings are consistent with a tibial plateau fracture  Ultrasound and interpretation by Clearance Coots, MD    ASSESSMENT & PLAN:   Screening for osteoporosis Screening for osteoporosis given recent trauma and fracture.  Closed fracture of left tibial plateau X-ray was unrevealing but ultrasound demonstrates significant change around the tibial plateau to suggest fracture given recent trauma. -Counseled on home exercise therapy and supportive care. -Hinged knee  brace. -Counseled on limited weightbearing on the left leg. -  Could consider further imaging or physical therapy.

## 2020-10-30 NOTE — Assessment & Plan Note (Signed)
Screening for osteoporosis given recent trauma and fracture.

## 2020-10-30 NOTE — Telephone Encounter (Signed)
Armbruster, Jeanette Raspberry, MD  Yevette Edwards, RN [facility] can you help relay the following:  - I discussed [name]'s case at our IBD multi-D meeting. The group concurred that surgery is her best option at this point in time. The surgeons at [place] [facility] would be happy to see her back to discuss helping her with this if she wishes to have it done locally. Can you see if she is interested in seeing them for a consult again? She does not wish to be seen by [facility]. Can you tell me who she is scheduled with so I can give them a heads up about her. Thanks. Would continue Entyvio for now otherwise.    Spoke with patient, she states that she would like to discuss with her mother and significant other before making a final decision. Patient will call and let me know what she decides. She is aware that she will need to continue [facility] for now. Patient verbalized understanding and had no concerns at the end of the call.

## 2020-10-31 ENCOUNTER — Telehealth: Payer: Self-pay | Admitting: Family Medicine

## 2020-10-31 NOTE — Telephone Encounter (Signed)
Left VM for patient. If she calls back please have her speak with a nurse/CMA and inform that her bone density shows osteopenia. She needs to maximize her vitamin D and calcium. We could check her vitamin D if needed.   If any questions then please take the best time and phone number to call and I will try to call her back.   Rosemarie Ax, MD [facility] 10/31/2020, 12:25 PM

## 2020-11-05 ENCOUNTER — Other Ambulatory Visit: Payer: Self-pay | Admitting: Family Medicine

## 2020-11-05 DIAGNOSIS — F322 Major depressive disorder, single episode, severe without psychotic features: Secondary | ICD-10-CM

## 2020-11-05 NOTE — Telephone Encounter (Signed)
Follow up message sent to patient via My Chart.

## 2020-11-06 DIAGNOSIS — K5100 Ulcerative (chronic) pancolitis without complications: Secondary | ICD-10-CM | POA: Diagnosis not present

## 2020-11-12 NOTE — Telephone Encounter (Signed)
Lm on vm for patient to return call to discuss her decision in regards to below, also advised that she can send me a message via My Chart as well.

## 2020-11-13 ENCOUNTER — Ambulatory Visit: Payer: Medicare Other | Admitting: Family Medicine

## 2020-11-13 NOTE — Progress Notes (Deleted)
[name] - 65 y.o. female MRN [id number]  Date of birth: February 20, 1956  SUBJECTIVE:  Including CC & ROS.  No chief complaint on file.   [name] is a 65 y.o. female that is  ***.  ***   Review of Systems See HPI   HISTORY: Past Medical, Surgical, Social, and Family History Reviewed & Updated per EMR.   Pertinent Historical Findings include:  Past Medical History:  Diagnosis Date  . Abdominal pain    Hx  . Allergy   . Anal stenosis   . Anemia   . Anxiety   . Arthritis   . Asthma    patient does not have inhaler  . Blood in stool    Hx  . Blood in urine    Hx  . Blood transfusion without reported diagnosis   . Cataract   . CKD (chronic kidney disease) stage 3, GFR 30-59 ml/min (HCC)   . Crohn's colitis, other complication ([place])   . De Quervain's tenosynovitis   . Depression   . Difficulty urinating    Hx  . Easy bruising   . Esophagitis   . Fainting    History - resolved - due to dehydration  . Fatigue    Hx  . Fibroid    Hx  . Gastric polyp   . GERD (gastroesophageal reflux disease)   . 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 Hemorrhoids, external   . Hemorrhoids, internal   . Herpes, genital    vaginal treated 07/05/12 and pt states is resolved  . History of cervical dysplasia   . History of small bowel obstruction   . Hyperlipidemia    currently no meds  . Hyperparathyroidism   . Hypertension   . Hypokalemia    Hx  . 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 Obesity   . Ovarian cyst   . Poor dental hygiene   . Pulmonary nodule, right    right upper lobe  . Rectal bleeding    Hx  . Rectal pain    Hx  . Renal insufficiency    CKD - stage 3  . RLS (restless legs syndrome)    no meds  . Tooth infection 11/2016   right low  . Ulcerative colitis   . Visual disturbance    wears glasses  . Weakness generalized    Hx - patient  denies generalized weakness  . Wears dentures    upper only    Past Surgical History:  Procedure Laterality Date  . ANAL DILATION    . CERVICAL BIOPSY  W/ LOOP ELECTRODE EXCISION    . CHOLECYSTECTOMY    . COLONOSCOPY     [name]  . fatty tumor removed from back     X 2  . HEMORRHOID SURGERY    . ILEOSTOMY CLOSURE    . RESTORATIVE PROCTOCOLECTOMY     with insertion of ileoanal J Pouch with loop ileostomy  . SHOULDER ARTHROSCOPY WITH CAPSULORRHAPHY Left 06/14/2019   Procedure: LEFT SHOULDER ARTHRSCOPIC REPAIR OF BONY BANKART FRACTURE;  Surgeon: Tania Ade, MD;  Location: [facility];  Service: Orthopedics;  Laterality: Left;  REQUEST 90 MINUTE  . SIGMOIDOSCOPY    . TOTAL ABDOMINAL HYSTERECTOMY  1998   TAH/LSO  . TUBAL LIGATION    . UPPER GASTROINTESTINAL ENDOSCOPY  [name]    Family History  Problem Relation Age of Onset  . Ulcerative colitis Father   . Hypertension Father   . Heart attack Father   . Hypertension Mother   . Heart disease Mother        s/p pci  . Ulcerative colitis Daughter   . Irritable bowel syndrome Other        grandchildren  . Diabetes Sister   . Cancer Sister        uterine  . Cancer Maternal Uncle        LUNG  . Colon cancer Neg Hx   . Esophageal cancer Neg Hx   . Stomach cancer Neg Hx   . Rectal cancer Neg Hx     Social History   Socioeconomic History  . Marital status: Divorced    Spouse name: Not on file  . Number of children: Not on file  . Years of education: Not on file  . Highest education level: Not on file  Occupational History  . Not on file  Tobacco Use  . Smoking status: Former Smoker    Packs/day: 1.00    Years: 4.00    Pack years: 4.00    Types: Cigarettes    Quit date: 05/21/1979    Years since quitting: 41.5  . Smokeless tobacco: Never Used  Vaping Use  . Vaping Use: Never used  Substance and Sexual Activity  . Alcohol use: No    Alcohol/week: 0.0 standard drinks  . Drug use: No  . Sexual activity: Yes     Birth control/protection: Post-menopausal    Comment: 1st intercourse 65 yo-Fewer than 5 partners  Other Topics Concern  . Not on file  Social History Narrative  . Not on file   Social Determinants of Health   Financial Resource Strain: Not on file  Food Insecurity: Not on file  Transportation Needs: Not on file  Physical Activity: Not on file  Stress: Not on file  Social Connections: Not on file  Intimate Partner Violence: Not on file     PHYSICAL EXAM:  VS: There were no vitals taken for this visit. Physical Exam Gen: NAD, alert, cooperative with exam, well-appearing MSK:  ***      ASSESSMENT & PLAN:   No problem-specific Assessment & Plan notes found for this encounter.

## 2020-11-17 ENCOUNTER — Telehealth: Payer: Self-pay | Admitting: Gastroenterology

## 2020-11-17 MED ORDER — METRONIDAZOLE 250 MG PO TABS: 250.0000 mg | ORAL_TABLET | Freq: Three times a day (TID) | ORAL | 0 refills | Status: AC

## 2020-11-17 NOTE — Telephone Encounter (Signed)
Agree with your recommendations.  She can continue the Imodium and we can also add Flagyl 250 3 times daily for 2 weeks as she has responded to that in the past for component of pouchitis.  She has had renal failure and dehydration due to increasing diarrhea in recent months.  I think reasonable to give her 1 L of normal saline with her Entyvio infusion if she would like to do this, please place the order.  She needs to follow-up with her nephrologist for her kidney function to make sure stable.

## 2020-11-17 NOTE — Telephone Encounter (Signed)
Spoke with patient, she reports having diarrhea for about 3-4 days, taking 2 imodium every morning and sometimes another in the evening. Denies any abdominal pain or fever. She reports fatigue and states that she has been trying to stay hydrated but she thinks she could use some fluids. Patient is wanting to know if an order can be placed for her to receive fluids when she goes for her Entyvio infusion at the end of the month. Pt states that she has not been taking Citrucel, I advised that she begin taking this to try and help bulk up her stool some. She states that she has not made a decision about the surgery yet, she states that she is trying to move and get her house sold before she does anything else. Please advise, thank you.

## 2020-11-17 NOTE — Telephone Encounter (Signed)
See telephone encounter from 11/17/20 for more information.

## 2020-11-17 NOTE — Telephone Encounter (Signed)
Spoke with patient in regards to Dr. Doyne Keel recommendations. Patient is aware that prescription for Flagyl has been sent in. She is aware that she will need to follow up with her nephrologist to make sure her kidney function is stable. Patient is aware that we will place an order for 1 L of normal saline for her Entyvio infusion. Patient verbalized understanding and had no concerns at the end of the call.   [facility]- [phone] - [place]   I spoke with Elmo Putt at [facility] acute pharmacy, gave a verbal order for 1 L of 0.9% normal saline to be infused prior to [place]. Next infusion is due 12/04/20. Patient is aware of this information.

## 2020-11-19 ENCOUNTER — Ambulatory Visit: Payer: Medicare Other | Admitting: Family Medicine

## 2020-11-19 ENCOUNTER — Other Ambulatory Visit: Payer: Self-pay

## 2020-11-19 DIAGNOSIS — S82142D Displaced bicondylar fracture of left tibia, subsequent encounter for closed fracture with routine healing: Secondary | ICD-10-CM | POA: Diagnosis not present

## 2020-11-19 NOTE — Progress Notes (Signed)
[name] - 65 y.o. female MRN [id number]  Date of birth: Dec 28, 1955  SUBJECTIVE:  Including CC & ROS.  No chief complaint on file.   [name] is a 65 y.o. female that is following up for her left knee pain.  She still has pain intermittently.  Has been trying to wear the brace.  She is still actively moving her house.  Pain is occurring anteriorly   Review of Systems See HPI   HISTORY: Past Medical, Surgical, Social, and Family History Reviewed & Updated per EMR.   Pertinent Historical Findings include:  Past Medical History:  Diagnosis Date  . Abdominal pain    Hx  . Allergy   . Anal stenosis   . Anemia   . Anxiety   . Arthritis   . Asthma    patient does not have inhaler  . Blood in stool    Hx  . Blood in urine    Hx  . Blood transfusion without reported diagnosis   . Cataract   . CKD (chronic kidney disease) stage 3, GFR 30-59 ml/min (HCC)   . Crohn's colitis, other complication ([place])   . De Quervain's tenosynovitis   . Depression   . Difficulty urinating    Hx  . Easy bruising   . Esophagitis   . Fainting    History - resolved - due to dehydration  . Fatigue    Hx  . Fibroid    Hx  . Gastric polyp   . GERD (gastroesophageal reflux disease)   . 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 Hemorrhoids, external   . Hemorrhoids, internal   . Herpes, genital    vaginal treated 07/05/12 and pt states is resolved  . History of cervical dysplasia   . History of small bowel obstruction   . Hyperlipidemia    currently no meds  . Hyperparathyroidism   . Hypertension   . Hypokalemia    Hx  . 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 Obesity   . Ovarian cyst   . Poor dental hygiene   . Pulmonary nodule, right    right upper lobe  . Rectal bleeding    Hx  . Rectal pain    Hx  . Renal insufficiency    CKD - stage 3  . RLS (restless legs  syndrome)    no meds  . Tooth infection 11/2016   right low  . Ulcerative colitis   . Visual disturbance    wears glasses  . Weakness generalized    Hx - patient denies generalized weakness  . Wears dentures    upper only    Past Surgical History:  Procedure Laterality Date  . ANAL DILATION    . CERVICAL BIOPSY  W/ LOOP ELECTRODE EXCISION    . CHOLECYSTECTOMY    . COLONOSCOPY     [name]  . fatty tumor removed from back     X 2  . HEMORRHOID SURGERY    . ILEOSTOMY CLOSURE    . RESTORATIVE PROCTOCOLECTOMY     with insertion of ileoanal J Pouch with loop ileostomy  . SHOULDER ARTHROSCOPY WITH CAPSULORRHAPHY Left 06/14/2019   Procedure: LEFT SHOULDER ARTHRSCOPIC REPAIR OF BONY BANKART FRACTURE;  Surgeon: Tania Ade, MD;  Location: [facility];  Service: Orthopedics;  Laterality: Left;  REQUEST 90 MINUTE  .  SIGMOIDOSCOPY    . TOTAL ABDOMINAL HYSTERECTOMY  1998   TAH/LSO  . TUBAL LIGATION    . UPPER GASTROINTESTINAL ENDOSCOPY     [name]    Family History  Problem Relation Age of Onset  . Ulcerative colitis Father   . Hypertension Father   . Heart attack Father   . Hypertension Mother   . Heart disease Mother        s/p pci  . Ulcerative colitis Daughter   . Irritable bowel syndrome Other        grandchildren  . Diabetes Sister   . Cancer Sister        uterine  . Cancer Maternal Uncle        LUNG  . Colon cancer Neg Hx   . Esophageal cancer Neg Hx   . Stomach cancer Neg Hx   . Rectal cancer Neg Hx     Social History   Socioeconomic History  . Marital status: Divorced    Spouse name: Not on file  . Number of children: Not on file  . Years of education: Not on file  . Highest education level: Not on file  Occupational History  . Not on file  Tobacco Use  . Smoking status: Former Smoker    Packs/day: 1.00    Years: 4.00    Pack years: 4.00    Types: Cigarettes    Quit date: 05/21/1979    Years since quitting: 41.5  . Smokeless tobacco: Never Used   Vaping Use  . Vaping Use: Never used  Substance and Sexual Activity  . Alcohol use: No    Alcohol/week: 0.0 standard drinks  . Drug use: No  . Sexual activity: Yes    Birth control/protection: Post-menopausal    Comment: 1st intercourse 65 yo-Fewer than 5 partners  Other Topics Concern  . Not on file  Social History Narrative  . Not on file   Social Determinants of Health   Financial Resource Strain: Not on file  Food Insecurity: Not on file  Transportation Needs: Not on file  Physical Activity: Not on file  Stress: Not on file  Social Connections: Not on file  Intimate Partner Violence: Not on file     PHYSICAL EXAM:  VS: BP 110/60   Ht 5\' 3"  (1.6 m)   Wt 200 lb (90.7 kg)   BMI 35.43 kg/m  Physical Exam Gen: NAD, alert, cooperative with exam, well-appearing    ASSESSMENT & PLAN:   Closed fracture of left tibial plateau Having ongoing pain. Tries to wear the brace when she can.  -Counseled on exercise therapy and supportive care. -Could consider physical therapy or further imaging.

## 2020-11-19 NOTE — Assessment & Plan Note (Signed)
Having ongoing pain. Tries to wear the brace when she can.  -Counseled on exercise therapy and supportive care. -Could consider physical therapy or further imaging.

## 2020-11-22 ENCOUNTER — Other Ambulatory Visit: Payer: Self-pay | Admitting: Family Medicine

## 2020-11-22 DIAGNOSIS — F418 Other specified anxiety disorders: Secondary | ICD-10-CM

## 2020-11-30 ENCOUNTER — Other Ambulatory Visit: Payer: Self-pay | Admitting: Family Medicine

## 2020-12-02 ENCOUNTER — Other Ambulatory Visit: Payer: Self-pay | Admitting: Family Medicine

## 2020-12-02 DIAGNOSIS — K5100 Ulcerative (chronic) pancolitis without complications: Secondary | ICD-10-CM | POA: Diagnosis not present

## 2020-12-02 DIAGNOSIS — E8881 Metabolic syndrome: Secondary | ICD-10-CM

## 2020-12-07 ENCOUNTER — Other Ambulatory Visit: Payer: Self-pay

## 2020-12-07 ENCOUNTER — Emergency Department (HOSPITAL_COMMUNITY): Payer: Medicare Other

## 2020-12-07 ENCOUNTER — Encounter (HOSPITAL_COMMUNITY): Payer: Self-pay | Admitting: Emergency Medicine

## 2020-12-07 ENCOUNTER — Inpatient Hospital Stay (HOSPITAL_COMMUNITY)
Admission: EM | Admit: 2020-12-07 | Discharge: 2020-12-10 | DRG: 872 | Disposition: A | Discharge status: Home / Self Care | Payer: Medicare Other | Attending: Internal Medicine | Admitting: Internal Medicine

## 2020-12-07 DIAGNOSIS — D631 Anemia in chronic kidney disease: Secondary | ICD-10-CM | POA: Diagnosis present

## 2020-12-07 DIAGNOSIS — Z713 Dietary counseling and surveillance: Secondary | ICD-10-CM

## 2020-12-07 DIAGNOSIS — R911 Solitary pulmonary nodule: Secondary | ICD-10-CM | POA: Diagnosis not present

## 2020-12-07 DIAGNOSIS — Z90710 Acquired absence of both cervix and uterus: Secondary | ICD-10-CM

## 2020-12-07 DIAGNOSIS — R6520 Severe sepsis without septic shock: Secondary | ICD-10-CM

## 2020-12-07 DIAGNOSIS — G2581 Restless legs syndrome: Secondary | ICD-10-CM | POA: Diagnosis not present

## 2020-12-07 DIAGNOSIS — I129 Hypertensive chronic kidney disease with stage 1 through stage 4 chronic kidney disease, or unspecified chronic kidney disease: Secondary | ICD-10-CM | POA: Diagnosis not present

## 2020-12-07 DIAGNOSIS — Z20822 Contact with and (suspected) exposure to covid-19: Secondary | ICD-10-CM | POA: Diagnosis not present

## 2020-12-07 DIAGNOSIS — A419 Sepsis, unspecified organism: Secondary | ICD-10-CM | POA: Diagnosis present

## 2020-12-07 DIAGNOSIS — E785 Hyperlipidemia, unspecified: Secondary | ICD-10-CM | POA: Diagnosis present

## 2020-12-07 DIAGNOSIS — K2100 Gastro-esophageal reflux disease with esophagitis, without bleeding: Secondary | ICD-10-CM | POA: Diagnosis present

## 2020-12-07 DIAGNOSIS — K529 Noninfective gastroenteritis and colitis, unspecified: Secondary | ICD-10-CM | POA: Diagnosis present

## 2020-12-07 DIAGNOSIS — D6852 Prothrombin gene mutation: Secondary | ICD-10-CM | POA: Diagnosis present

## 2020-12-07 DIAGNOSIS — H9192 Unspecified hearing loss, left ear: Secondary | ICD-10-CM | POA: Diagnosis not present

## 2020-12-07 DIAGNOSIS — N10 Acute tubulo-interstitial nephritis: Secondary | ICD-10-CM

## 2020-12-07 DIAGNOSIS — Z79899 Other long term (current) drug therapy: Secondary | ICD-10-CM | POA: Diagnosis not present

## 2020-12-07 DIAGNOSIS — R000 Tachycardia, unspecified: Secondary | ICD-10-CM | POA: Diagnosis not present

## 2020-12-07 DIAGNOSIS — N281 Cyst of kidney, acquired: Secondary | ICD-10-CM | POA: Diagnosis not present

## 2020-12-07 DIAGNOSIS — Z8616 Personal history of COVID-19: Secondary | ICD-10-CM

## 2020-12-07 DIAGNOSIS — M47819 Spondylosis without myelopathy or radiculopathy, site unspecified: Secondary | ICD-10-CM | POA: Diagnosis not present

## 2020-12-07 DIAGNOSIS — Z8619 Personal history of other infectious and parasitic diseases: Secondary | ICD-10-CM

## 2020-12-07 DIAGNOSIS — Z87891 Personal history of nicotine dependence: Secondary | ICD-10-CM

## 2020-12-07 DIAGNOSIS — N184 Chronic kidney disease, stage 4 (severe): Secondary | ICD-10-CM | POA: Diagnosis not present

## 2020-12-07 DIAGNOSIS — Z9049 Acquired absence of other specified parts of digestive tract: Secondary | ICD-10-CM | POA: Diagnosis not present

## 2020-12-07 DIAGNOSIS — N12 Tubulo-interstitial nephritis, not specified as acute or chronic: Secondary | ICD-10-CM | POA: Diagnosis not present

## 2020-12-07 DIAGNOSIS — N179 Acute kidney failure, unspecified: Secondary | ICD-10-CM | POA: Diagnosis present

## 2020-12-07 DIAGNOSIS — Z885 Allergy status to narcotic agent status: Secondary | ICD-10-CM

## 2020-12-07 DIAGNOSIS — E871 Hypo-osmolality and hyponatremia: Secondary | ICD-10-CM | POA: Diagnosis not present

## 2020-12-07 DIAGNOSIS — A4151 Sepsis due to Escherichia coli [E. coli]: Secondary | ICD-10-CM | POA: Diagnosis present

## 2020-12-07 DIAGNOSIS — I10 Essential (primary) hypertension: Secondary | ICD-10-CM | POA: Diagnosis present

## 2020-12-07 DIAGNOSIS — F32A Depression, unspecified: Secondary | ICD-10-CM | POA: Diagnosis not present

## 2020-12-07 DIAGNOSIS — R509 Fever, unspecified: Secondary | ICD-10-CM | POA: Diagnosis not present

## 2020-12-07 DIAGNOSIS — Z6834 Body mass index (BMI) 34.0-34.9, adult: Secondary | ICD-10-CM

## 2020-12-07 DIAGNOSIS — Z882 Allergy status to sulfonamides status: Secondary | ICD-10-CM

## 2020-12-07 DIAGNOSIS — K5010 Crohn's disease of large intestine without complications: Secondary | ICD-10-CM | POA: Diagnosis not present

## 2020-12-07 DIAGNOSIS — E669 Obesity, unspecified: Secondary | ICD-10-CM | POA: Diagnosis present

## 2020-12-07 HISTORY — DX: Acute pyelonephritis: N10

## 2020-12-07 HISTORY — DX: Severe sepsis without septic shock: R65.20

## 2020-12-07 LAB — CBC WITH DIFFERENTIAL/PLATELET
Abs Immature Granulocytes: 0.11 10*3/uL — ABNORMAL HIGH (ref 0.00–0.07)
Basophils Absolute: 0 10*3/uL (ref 0.0–0.1)
Basophils Relative: 0 %
Eosinophils Absolute: 0.1 10*3/uL (ref 0.0–0.5)
Eosinophils Relative: 1 %
HCT: 35.4 % — ABNORMAL LOW (ref 36.0–46.0)
Hemoglobin: 11.2 g/dL — ABNORMAL LOW (ref 12.0–15.0)
Immature Granulocytes: 1 %
Lymphocytes Relative: 6 %
Lymphs Abs: 1 10*3/uL (ref 0.7–4.0)
MCH: 32.7 pg (ref 26.0–34.0)
MCHC: 31.6 g/dL (ref 30.0–36.0)
MCV: 103.5 fL — ABNORMAL HIGH (ref 80.0–100.0)
Monocytes Absolute: 1.1 10*3/uL — ABNORMAL HIGH (ref 0.1–1.0)
Monocytes Relative: 7 %
Neutro Abs: 14.6 10*3/uL — ABNORMAL HIGH (ref 1.7–7.7)
Neutrophils Relative %: 85 %
Platelets: 370 10*3/uL (ref 150–400)
RBC: 3.42 MIL/uL — ABNORMAL LOW (ref 3.87–5.11)
RDW: 14.2 % (ref 11.5–15.5)
WBC: 17 10*3/uL — ABNORMAL HIGH (ref 4.0–10.5)
nRBC: 0 % (ref 0.0–0.2)

## 2020-12-07 LAB — URINALYSIS, ROUTINE W REFLEX MICROSCOPIC
Bilirubin Urine: NEGATIVE
Glucose, UA: NEGATIVE mg/dL
Ketones, ur: NEGATIVE mg/dL
Nitrite: POSITIVE — AB
Protein, ur: 100 mg/dL — AB
Specific Gravity, Urine: 1.02 (ref 1.005–1.030)
pH: 6 (ref 5.0–8.0)

## 2020-12-07 LAB — RESP PANEL BY RT-PCR (FLU A&B, COVID) ARPGX2
Influenza A by PCR: NEGATIVE
Influenza B by PCR: NEGATIVE
SARS Coronavirus 2 by RT PCR: NEGATIVE

## 2020-12-07 LAB — COMPREHENSIVE METABOLIC PANEL
ALT: 17 U/L (ref 0–44)
AST: 23 U/L (ref 15–41)
Albumin: 3.8 g/dL (ref 3.5–5.0)
Alkaline Phosphatase: 94 U/L (ref 38–126)
Anion gap: 11 (ref 5–15)
BUN: 35 mg/dL — ABNORMAL HIGH (ref 8–23)
CO2: 20 mmol/L — ABNORMAL LOW (ref 22–32)
Calcium: 9.1 mg/dL (ref 8.9–10.3)
Chloride: 101 mmol/L (ref 98–111)
Creatinine, Ser: 2.66 mg/dL — ABNORMAL HIGH (ref 0.44–1.00)
GFR, Estimated: 19 mL/min — ABNORMAL LOW (ref >60)
Glucose, Bld: 104 mg/dL — ABNORMAL HIGH (ref 70–99)
Potassium: 4.3 mmol/L (ref 3.5–5.1)
Sodium: 132 mmol/L — ABNORMAL LOW (ref 135–145)
Total Bilirubin: 1.3 mg/dL — ABNORMAL HIGH (ref 0.3–1.2)
Total Protein: 8.1 g/dL (ref 6.5–8.1)

## 2020-12-07 LAB — APTT: aPTT: 25 seconds (ref 24–36)

## 2020-12-07 LAB — PROTIME-INR
INR: 1 (ref 0.8–1.2)
Prothrombin Time: 13.1 seconds (ref 11.4–15.2)

## 2020-12-07 LAB — URINALYSIS, MICROSCOPIC (REFLEX)

## 2020-12-07 LAB — LACTIC ACID, PLASMA: Lactic Acid, Venous: 1.9 mmol/L (ref 0.5–1.9)

## 2020-12-07 IMAGING — DX DG CHEST 1V PORT
1 series · 1 of 1 positions shown · non-contrast
Comparison: [DATE]

CLINICAL DATA: Questionable sepsis

EXAM:
PORTABLE CHEST 1 VIEW

[chest ap]
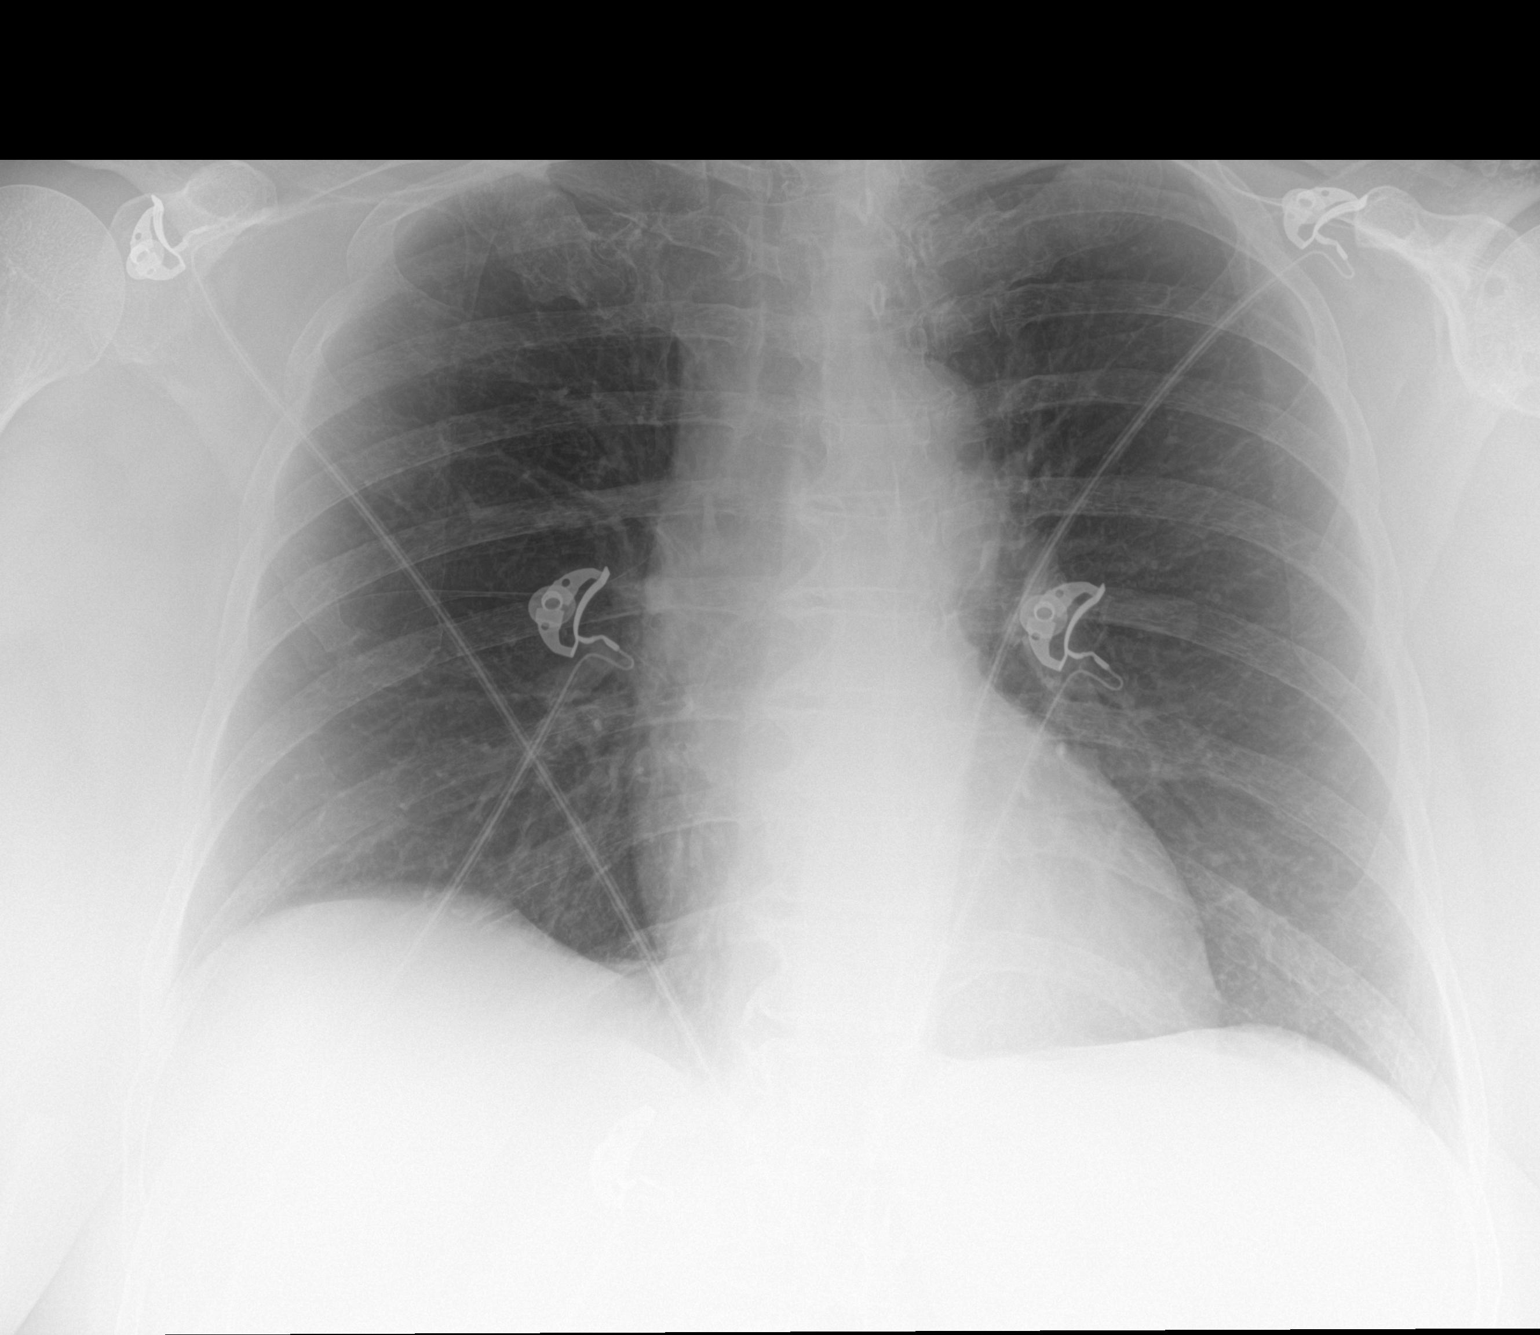

[1 of 1 positions shown; findings below may reference images not displayed]

FINDINGS: The heart size and mediastinal contours are within normal limits.
Both lungs are clear. The visualized skeletal structures are
unremarkable.
IMPRESSION: Normal study.

## 2020-12-07 IMAGING — CT CT ABD-PELV W/O CM
2 of 4 series · 16 of 46 positions shown, 18 images · non-contrast
Comparison: CT abdomen pelvis dated [DATE].

CLINICAL DATA: 64-year-old female with abdominal pain and fever.

EXAM:
CT ABDOMEN AND PELVIS WITHOUT CONTRAST
TECHNIQUE: Multidetector CT imaging of the abdomen and pelvis was performed
following the standard protocol without IV contrast.

[Series 2: axial st · axial · 0.94mm/px · z∈[-496,-91]mm · 13 of 93 slices shown, 15 images]
[im 6/93  soft-tissue]
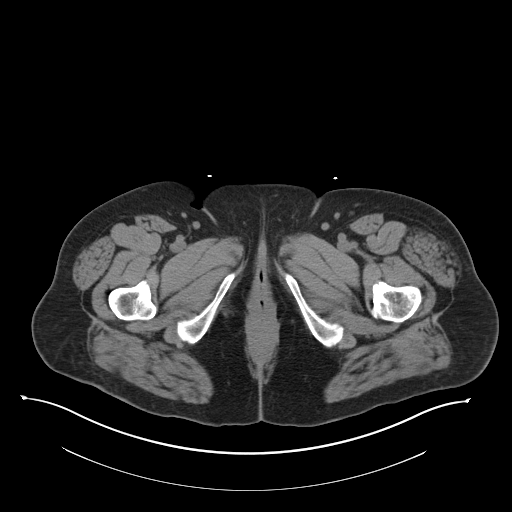
[im 6/93  bone]
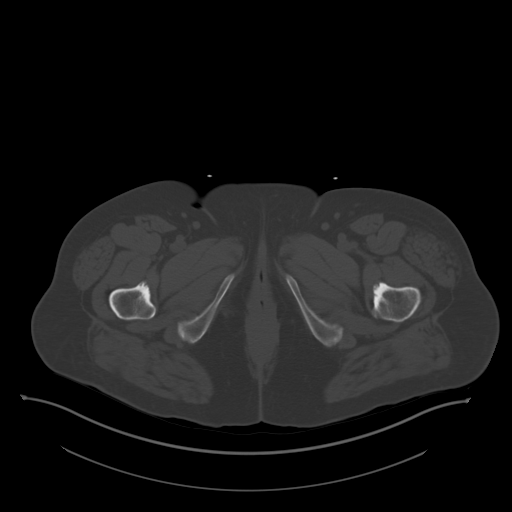
[im 11/93  soft-tissue]
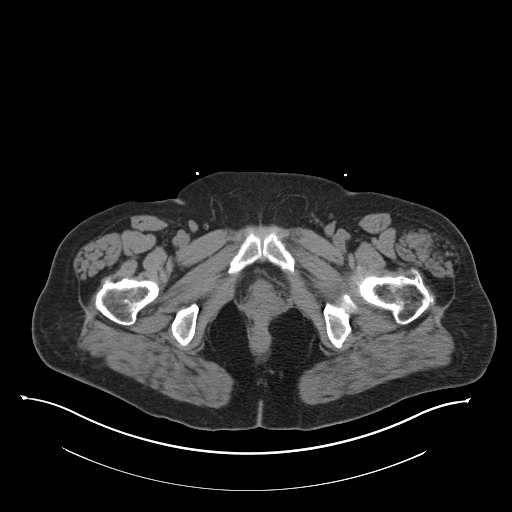
[im 22/93  soft-tissue]
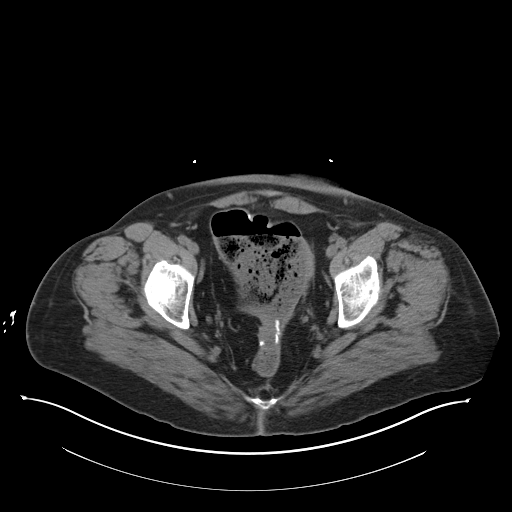
[im 28/93  soft-tissue]
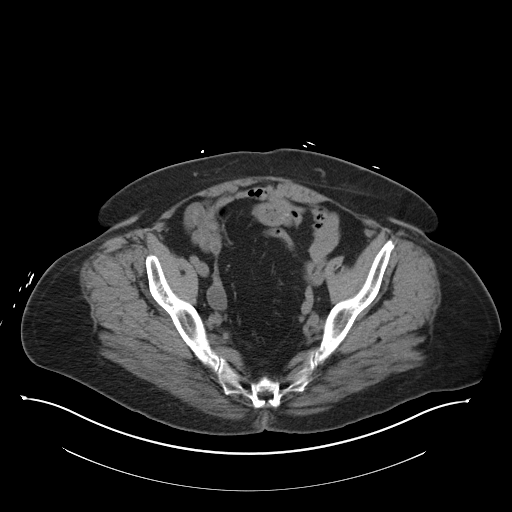
[im 33/93  soft-tissue]
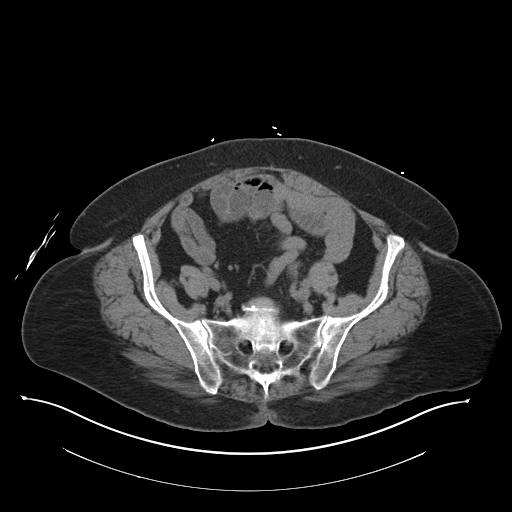
[im 38/93  soft-tissue]
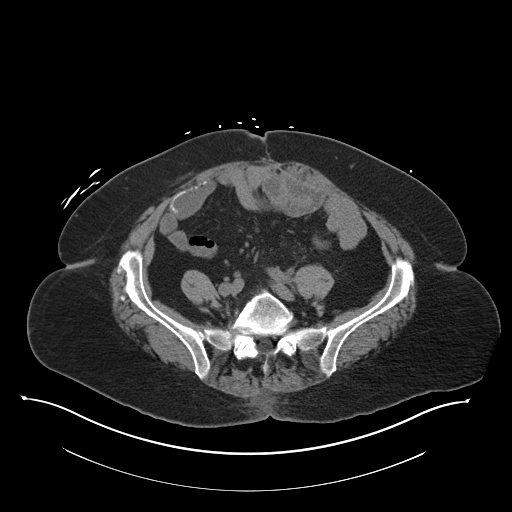
[im 49/93  soft-tissue]
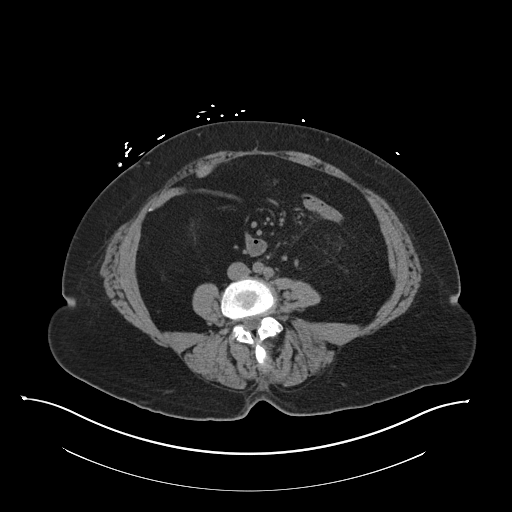
[im 55/93  soft-tissue]
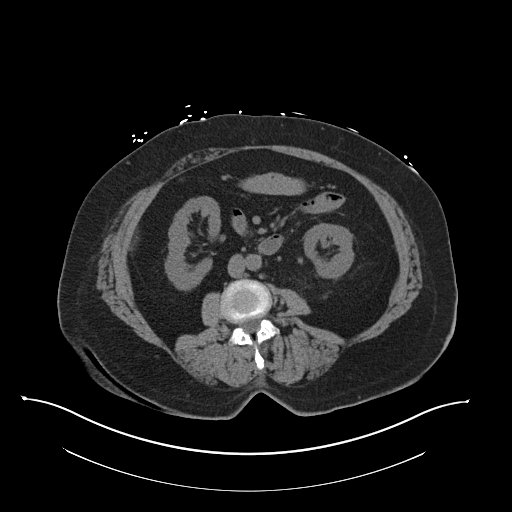
[im 60/93  soft-tissue]
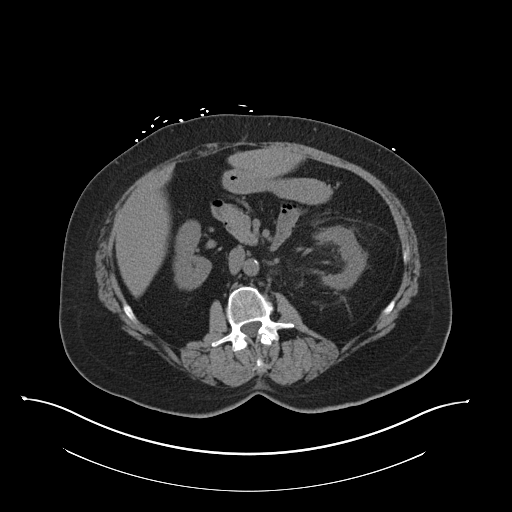
[im 60/93  bone]
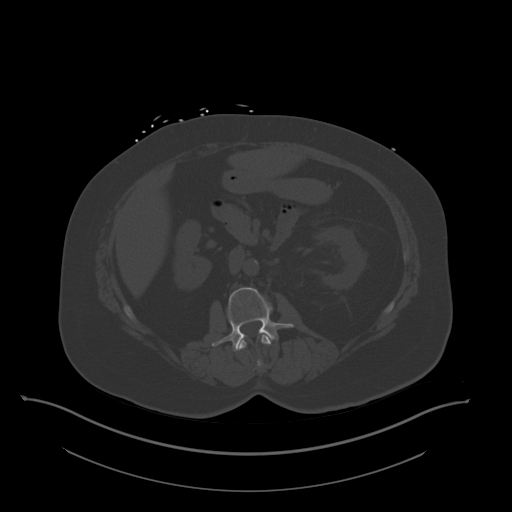
[im 65/93  soft-tissue]
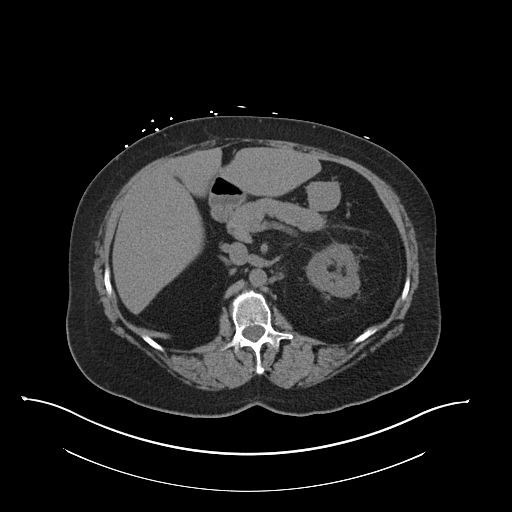
[im 71/93  soft-tissue]
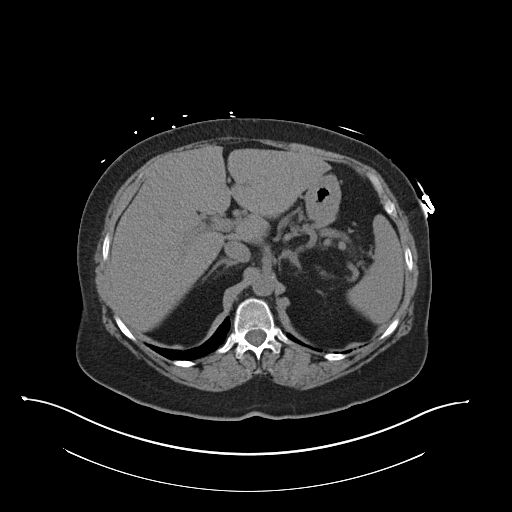
[im 82/93  soft-tissue]
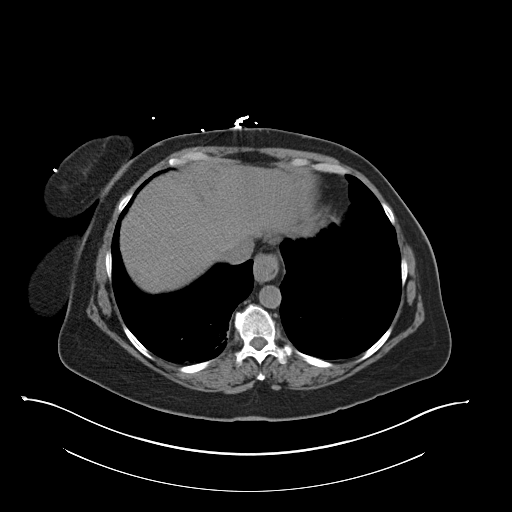
[im 87/93  soft-tissue]
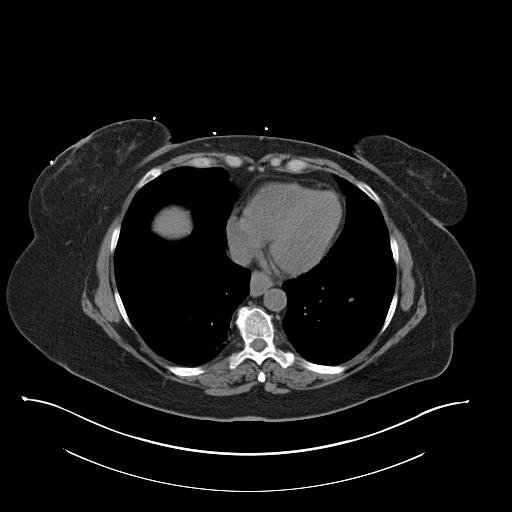

[Series 5: coronal st · coronal · 0.84mm/px · 3 of 166 slices shown]
[im 56/166  soft-tissue]
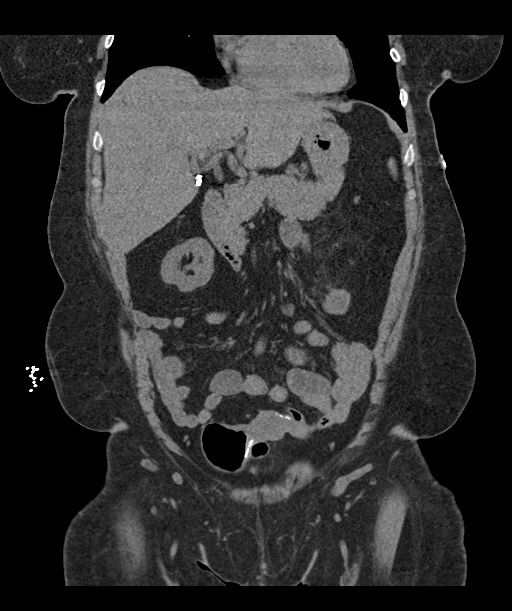
[im 74/166  soft-tissue]
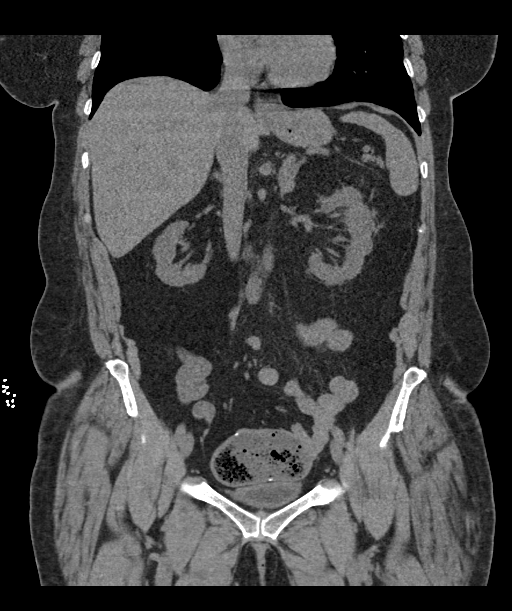
[im 92/166  soft-tissue]
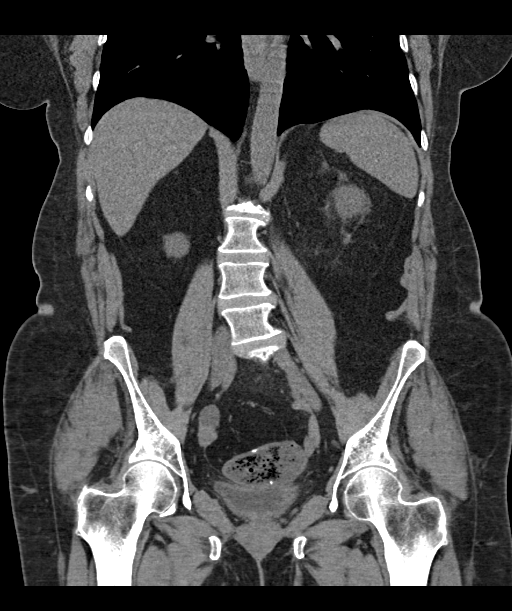

[16 of 46 positions shown; findings below may reference images not displayed]

FINDINGS: Evaluation of this exam is limited in the absence of intravenous
contrast.

Lower chest: The visualized lung bases are clear.

No intra-abdominal free air or free fluid.

Hepatobiliary: The liver is unremarkable. Cholecystectomy. No
retained calcified stone noted in the central CBD.

Pancreas: Unremarkable. No pancreatic ductal dilatation or
surrounding inflammatory changes.

Spleen: Normal in size without focal abnormality.

Adrenals/Urinary Tract: The adrenal glands unremarkable. There is a
punctate nonobstructing left renal interpolar calculus versus
vascular calcification. There is minimal fullness of the left renal
collecting system. No obstructing stone identified. There is left
perinephric stranding. Findings may be related to a recently passed
left renal calculus versus pyelonephritis. Correlation with
urinalysis recommended. No drainable fluid collection or abscess.
The right kidney, right ureter, and urinary bladder appear
unremarkable.

Stomach/Bowel: Postsurgical changes of the proctocolectomy with
anastomotic sutures in the pelvis. There is no bowel obstruction or
active inflammation.

Vascular/Lymphatic: Mild atherosclerotic calcification of the
abdominal aorta. The IVC is unremarkable. No portal venous gas.
There is no adenopathy.

Reproductive: Hysterectomy. There is a 2 cm right adnexal cyst. No
follow-up imaging recommended. Note: This recommendation does not
apply to premenarchal patients and to those with increased risk
(genetic, family history, elevated tumor markers or other high-risk
factors) of ovarian cancer. Reference: JACR [DATE]):[id number]

Other: Midline vertical anterior abdominal wall incisional scar.

Musculoskeletal: Degenerative changes of the spine. No acute osseous
pathology.
IMPRESSION: 1. Minimal fullness of the left renal collecting system with left
perinephric stranding. No obstructing stone identified. Findings may
be related to a recently passed left renal calculus versus
pyelonephritis. Correlation with urinalysis recommended. No
drainable fluid collection or abscess.
2. Postsurgical changes of the proctocolectomy. No bowel
obstruction.
3. Aortic Atherosclerosis ([RY]-[RY]).

## 2020-12-07 MED ORDER — VANCOMYCIN HCL 2000 MG/400ML IV SOLN
2000.0000 mg | Freq: Once | INTRAVENOUS | Status: DC
  Filled 2020-12-07: qty 400

## 2020-12-07 MED ORDER — SODIUM CHLORIDE 0.9 % IV SOLN
2.0000 g | Freq: Once | INTRAVENOUS | Status: AC
  Administered 2020-12-07: 2 g via INTRAVENOUS
  Filled 2020-12-07: qty 2

## 2020-12-07 MED ORDER — LACTATED RINGERS IV BOLUS (SEPSIS)
1000.0000 mL | Freq: Once | INTRAVENOUS | Status: AC
  Administered 2020-12-07: 1000 mL via INTRAVENOUS

## 2020-12-07 MED ORDER — METRONIDAZOLE IN NACL 5-0.79 MG/ML-% IV SOLN
500.0000 mg | Freq: Once | INTRAVENOUS | Status: AC
  Administered 2020-12-07: 500 mg via INTRAVENOUS
  Filled 2020-12-07: qty 100

## 2020-12-07 MED ORDER — VANCOMYCIN HCL IN DEXTROSE 1-5 GM/200ML-% IV SOLN: 1000.0000 mg | Freq: Once | INTRAVENOUS | Status: DC

## 2020-12-07 NOTE — H&P (Signed)
History and Physical    Chondra Ann H Gherardi MRN:[id number] DOB: 08/29/1956 DOA: 12/07/2020  PCP: Lowne Chase, Yvonne R, DO  Patient coming from: Home  I have personally briefly reviewed patient's old medical records in [facility]  Chief Complaint: Nausea, vomiting, fatigue  HPI: [name] is a 64 y.o. female with medical history significant for IBD (Crohn's disease) s/p colectomy and ileoproctostomy with J-pouch revision and chronic pouchitis, CKD stage IV, recurrent C. difficile colitis, and hypertension who presents to the ED for evaluation of nausea, vomiting, and fatigue.  Patient states over the last week she has had new issues with urinary frequency and urgency causing her to lose control of her bladder before she can make it to the bathroom.  She has had associated chills, diaphoresis, and fatigue.  She has had some left-sided abdominal/flank pain which she thought was related to her chronic pouchitis.  Today she developed new onset of nausea and vomiting.  She reports chronic loose stools which is unchanged from her baseline.  She says during her prior episodes of C. difficile colitis she had much more frequent watery diarrhea compared to her baseline.  She has not had any recent changes in her medications but she says she does sometimes forget to take her regular meds.  She denies any chest pain or dyspnea.  ED Course:  Initial vitals showed BP 141/93, pulse 129, RR 18, temp 100.3 F, SPO2 99% on room air.  Labs significant for WBC 17.0, hemoglobin 11.2, platelets 370,000, sodium 132, potassium 4.3, bicarb 20, BUN 35, creatinine 2.66 (previously 2.14 on 10/23/2020), AST 23, ALT 17, alk phos 94, total bilirubin 1.3, serum glucose 104, lactic acid 1.9, INR 1.0.  Urinalysis shows positive nitrates, small leukocytes, 6-10 RBC/hpf, 21-50 WBC/hpf, few bacteria on microscopy.  Urine and blood cultures obtained and pending.  SARS-CoV-2 PCR is negative.  Influenza A/B PCR  negative.  Portable chest x-ray without focal consolidation, edema, or effusion.  CT abdomen/pelvis without contrast shows some fullness of the left renal collecting system with left perinephric stranding.  No obstructing stone identified.  No drainable fluid collection or abscess noted.  Postsurgical changes of the proctocolectomy are seen without bowel obstruction.  Patient was given IV vancomycin, cefepime, and Flagyl, 1 L LR.  Hospitalist service was consulted to admit for further evaluation and management.  Review of Systems: All systems reviewed and are negative except as documented in history of present illness above.   Past Medical History:  Diagnosis Date  . Abdominal pain    Hx  . Allergy   . Anal stenosis   . Anemia   . Anxiety   . Arthritis   . Asthma    patient does not have inhaler  . Blood in stool    Hx  . Blood in urine    Hx  . Blood transfusion without reported diagnosis   . Cataract   . CKD (chronic kidney disease) stage 3, GFR 30-59 ml/min (HCC)   . Crohn's colitis, other complication (HCC)   . De Quervain's tenosynovitis   . Depression   . Difficulty urinating    Hx  . Easy bruising   . Esophagitis   . Fainting    History - resolved - due to dehydration  . Fatigue    Hx  . Fibroid    Hx  . Gastric polyp   . GERD (gastroesophageal reflux disease)   . Hearing loss    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   aid - 80% loss  . Hemorrhoids, external   . Hemorrhoids, internal   . Herpes, genital    vaginal treated 07/05/12 and pt states is resolved  . History of cervical dysplasia   . History of small bowel obstruction   . Hyperlipidemia    currently no meds  . Hyperparathyroidism   . Hypertension   . Hypokalemia    Hx  . 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 Obesity   . Ovarian cyst   . Poor dental hygiene   . Pulmonary nodule, right    right upper lobe  . Rectal  bleeding    Hx  . Rectal pain    Hx  . Renal insufficiency    CKD - stage 3  . RLS (restless legs syndrome)    no meds  . Tooth infection 11/2016   right low  . Ulcerative colitis   . Visual disturbance    wears glasses  . Weakness generalized    Hx - patient denies generalized weakness  . Wears dentures    upper only    Past Surgical History:  Procedure Laterality Date  . ANAL DILATION    . CERVICAL BIOPSY  W/ LOOP ELECTRODE EXCISION    . CHOLECYSTECTOMY    . COLONOSCOPY     [name]  . fatty tumor removed from back     X 2  . HEMORRHOID SURGERY    . ILEOSTOMY CLOSURE    . RESTORATIVE PROCTOCOLECTOMY     with insertion of ileoanal J Pouch with loop ileostomy  . SHOULDER ARTHROSCOPY WITH CAPSULORRHAPHY Left 06/14/2019   Procedure: LEFT SHOULDER ARTHRSCOPIC REPAIR OF BONY BANKART FRACTURE;  Surgeon: Chandler, Justin, MD;  Location: [facility];  Service: Orthopedics;  Laterality: Left;  REQUEST 90 MINUTE  . SIGMOIDOSCOPY    . TOTAL ABDOMINAL HYSTERECTOMY  1998   TAH/LSO  . TUBAL LIGATION    . UPPER GASTROINTESTINAL ENDOSCOPY     [name]    Social History:  reports that she quit smoking about 41 years ago. Her smoking use included cigarettes. She has a 4.00 pack-year smoking history. She has never used smokeless tobacco. She reports that she does not drink alcohol and does not use drugs.  Allergies  Allergen Reactions  . Sulfa Antibiotics Hives  . Morphine Other (See Comments)    Gives me crazy dreams Gives me crazy dreams  . Sulfonamide Derivatives Itching, Swelling and Rash    Family History  Problem Relation Age of Onset  . Ulcerative colitis Father   . Hypertension Father   . Heart attack Father   . Hypertension Mother   . Heart disease Mother        s/p pci  . Ulcerative colitis Daughter   . Irritable bowel syndrome Other        grandchildren  . Diabetes Sister   . Cancer Sister        uterine  . Cancer Maternal Uncle        LUNG  . Colon cancer Neg Hx    . Esophageal cancer Neg Hx   . Stomach cancer Neg Hx   . Rectal cancer Neg Hx      Prior to Admission medications   Medication Sig Start Date End Date Taking? Authorizing Provider  allopurinol (ZYLOPRIM) 300 MG tablet TAKE 1 TABLET BY MOUTH EVERY DAY 12/01/20   Lowne Chase, Yvonne R, DO    cholecalciferol (VITAMIN D) 25 MCG (1000 UNIT) tablet Take 1,000 Units by mouth daily.    [provider]  DULoxetine (CYMBALTA) 60 MG capsule Take 2 capsules (120 mg total) by mouth daily. 11/05/20   Lowne Chase, Yvonne R, DO  famotidine (PEPCID) 20 MG tablet Take 1 tablet (20 mg total) by mouth 2 (two) times daily. 09/30/20   Armbruster, Steven P, MD  folic acid (FOLVITE) 1 MG tablet Take 2 tablets (2 mg total) by mouth daily. Patient taking differently: Take 1 mg by mouth daily. 05/26/17   Ennever, Peter R, MD  gabapentin (NEURONTIN) 100 MG capsule TAKE 1 CAPSULE BY MOUTH THREE TIMES A DAY Patient taking differently: Take 100 mg by mouth 2 (two) times daily. 05/01/20   Lowne Chase, Yvonne R, DO  methylcellulose (CITRUCEL) oral powder Use as directed, daily 10/03/20   Armbruster, Steven P, MD  metoprolol succinate (TOPROL-XL) 100 MG 24 hr tablet Take 50-100 mg by mouth See admin instructions. Take 100 mg in the a.m. and 50 mg in the evening    [provider]  vedolizumab (ENTYVIO) 300 MG injection Inject 300 mg into the vein every 30 (thirty) days.    [provider]  Vitamin D, Ergocalciferol, (DRISDOL) 1.25 MG (50000 UNIT) CAPS capsule Take 50,000 Units by mouth every 30 (thirty) days.    [provider]    Physical Exam: Vitals:   12/07/20 2140 12/07/20 2200 12/07/20 2230 12/07/20 2300  BP: (!) 159/91 (!) 141/77 138/80 (!) 144/93  Pulse: (!) 114 (!) 110 (!) 107 (!) 106  Resp: 19 20 19 17  Temp:      TempSrc:      SpO2: 99% 96% 99% 97%  Weight:      Height:       Constitutional: Resting supine in bed, appears tired but in NAD, calm, comfortable Eyes: PERRL, lids  and conjunctivae normal ENMT: Mucous membranes are dry. Posterior pharynx clear of any exudate or lesions.Normal dentition.  Neck: normal, supple, no masses. Respiratory: clear to auscultation bilaterally, no wheezing, no crackles. Normal respiratory effort. No accessory muscle use.  Cardiovascular: Tachycardic with regular rhythm, no murmurs / rubs / gallops. No extremity edema. 2+ pedal pulses. Abdomen: Well-healed midline abdominal scar, mild LUQ tenderness, no masses palpated. No hepatosplenomegaly. Bowel sounds diminished.  Musculoskeletal: no clubbing / cyanosis. No joint deformity upper and lower extremities. Good ROM, no contractures. Normal muscle tone.  Skin: no rashes, lesions, ulcers. No induration Neurologic: CN 2-12 grossly intact. Sensation intact. Strength 5/5 in all 4.  Psychiatric: Normal judgment and insight. Alert and oriented x 3. Normal mood.   Labs on Admission: I have personally reviewed following labs and imaging studies  CBC: Recent Labs  Lab 12/07/20 2056  WBC 17.0*  NEUTROABS 14.6*  HGB 11.2*  HCT 35.4*  MCV 103.5*  PLT 370   Basic Metabolic Panel: Recent Labs  Lab 12/07/20 2056  NA 132*  K 4.3  CL 101  CO2 20*  GLUCOSE 104*  BUN 35*  CREATININE 2.66*  CALCIUM 9.1   GFR: Estimated Creatinine Clearance: 22.2 mL/min (A) (by C-G formula based on SCr of 2.66 mg/dL (H)). Liver Function Tests: Recent Labs  Lab 12/07/20 2056  AST 23  ALT 17  ALKPHOS 94  BILITOT 1.3*  PROT 8.1  ALBUMIN 3.8   No results for input(s): LIPASE, AMYLASE in the last 168 hours. No results for input(s): AMMONIA in the last 168 hours. Coagulation Profile: Recent Labs  Lab 12/07/20   2056  INR 1.0   Cardiac Enzymes: No results for input(s): CKTOTAL, CKMB, CKMBINDEX, TROPONINI in the last 168 hours. BNP (last 3 results) No results for input(s): PROBNP in the last 8760 hours. HbA1C: No results for input(s): HGBA1C in the last 72 hours. CBG: No results for  input(s): GLUCAP in the last 168 hours. Lipid Profile: No results for input(s): CHOL, HDL, LDLCALC, TRIG, CHOLHDL, LDLDIRECT in the last 72 hours. Thyroid Function Tests: No results for input(s): TSH, T4TOTAL, FREET4, T3FREE, THYROIDAB in the last 72 hours. Anemia Panel: No results for input(s): VITAMINB12, FOLATE, FERRITIN, TIBC, IRON, RETICCTPCT in the last 72 hours. Urine analysis:    Component Value Date/Time   COLORURINE YELLOW 12/07/2020 2141   APPEARANCEUR HAZY (A) 12/07/2020 2141   LABSPEC 1.020 12/07/2020 2141   PHURINE 6.0 12/07/2020 2141   GLUCOSEU NEGATIVE 12/07/2020 2141   GLUCOSEU NEGATIVE 03/14/2009 1603   HGBUR MODERATE (A) 12/07/2020 2141   BILIRUBINUR NEGATIVE 12/07/2020 2141   BILIRUBINUR Negative 01/06/2018 1306   KETONESUR NEGATIVE 12/07/2020 2141   PROTEINUR 100 (A) 12/07/2020 2141   UROBILINOGEN 1.0 01/06/2018 1306   UROBILINOGEN 0.2 04/12/2013 0900   NITRITE POSITIVE (A) 12/07/2020 2141   LEUKOCYTESUR SMALL (A) 12/07/2020 2141    Radiological Exams on Admission: CT ABDOMEN PELVIS WO CONTRAST  Result Date: 12/07/2020 CLINICAL DATA:  64-year-old female with abdominal pain and fever. EXAM: CT ABDOMEN AND PELVIS WITHOUT CONTRAST TECHNIQUE: Multidetector CT imaging of the abdomen and pelvis was performed following the standard protocol without IV contrast. COMPARISON:  CT abdomen pelvis dated 04/28/2011. FINDINGS: Evaluation of this exam is limited in the absence of intravenous contrast. Lower chest: The visualized lung bases are clear. No intra-abdominal free air or free fluid. Hepatobiliary: The liver is unremarkable. Cholecystectomy. No retained calcified stone noted in the central CBD. Pancreas: Unremarkable. No pancreatic ductal dilatation or surrounding inflammatory changes. Spleen: Normal in size without focal abnormality. Adrenals/Urinary Tract: The adrenal glands unremarkable. There is a punctate nonobstructing left renal interpolar calculus versus vascular  calcification. There is minimal fullness of the left renal collecting system. No obstructing stone identified. There is left perinephric stranding. Findings may be related to a recently passed left renal calculus versus pyelonephritis. Correlation with urinalysis recommended. No drainable fluid collection or abscess. The right kidney, right ureter, and urinary bladder appear unremarkable. Stomach/Bowel: Postsurgical changes of the proctocolectomy with anastomotic sutures in the pelvis. There is no bowel obstruction or active inflammation. Vascular/Lymphatic: Mild atherosclerotic calcification of the abdominal aorta. The IVC is unremarkable. No portal venous gas. There is no adenopathy. Reproductive: Hysterectomy. There is a 2 cm right adnexal cyst. No follow-up imaging recommended. Note: This recommendation does not apply to premenarchal patients and to those with increased risk (genetic, family history, elevated tumor markers or other high-risk factors) of ovarian cancer. Reference: JACR 2020 Feb; 17(2):248-254 Other: Midline vertical anterior abdominal wall incisional scar. Musculoskeletal: Degenerative changes of the spine. No acute osseous pathology. IMPRESSION: 1. Minimal fullness of the left renal collecting system with left perinephric stranding. No obstructing stone identified. Findings may be related to a recently passed left renal calculus versus pyelonephritis. Correlation with urinalysis recommended. No drainable fluid collection or abscess. 2. Postsurgical changes of the proctocolectomy. No bowel obstruction. 3. Aortic Atherosclerosis (ICD10-I70.0). Electronically Signed   By: Arash  Radparvar M.D.   On: 12/07/2020 23:06   DG Chest Port 1 View  Result Date: 12/07/2020 CLINICAL DATA:  Questionable sepsis EXAM: PORTABLE CHEST 1 VIEW COMPARISON:  09/21/2020 FINDINGS: The   heart size and mediastinal contours are within normal limits. Both lungs are clear. The visualized skeletal structures are  unremarkable. IMPRESSION: Normal study. Electronically Signed   By: Rolm Baptise M.D.   On: 12/07/2020 20:52    EKG: Ordered and pending.  Assessment/Plan Principal Problem:   Severe sepsis (HCC) Active Problems:   Essential hypertension   IBD (inflammatory bowel disease)   Acute renal failure superimposed on stage 4 chronic kidney disease (HCC)   Acute pyelonephritis   Meggin Ola is a 65 y.o. female with medical history significant for IBD (Crohn's disease) s/p colectomy and ileoproctostomy with J-pouch revision and chronic pouchitis, CKD stage IV, recurrent C. difficile colitis, and hypertension who is admitted with severe sepsis due to pyelonephritis.  Severe sepsis due to UTI/pyelonephritis, POA: Patient presenting with tachycardia, tachypnea, leukocytosis, AKI, and suspected urinary source of infection. Low threshold to check for C. difficile if she develops any diarrhea. -Narrow antibiotics to IV ceftriaxone alone -Follow urine and blood cultures -Continue IV fluid hydration overnight  AKI on CKD stage IV: In the setting of sepsis and GI losses/decreased oral intake.  Continue IV fluid hydration overnight and repeat labs in AM.  Crohn's disease s/p colectomy with ileoproctostomy and J-pouch revision in 1998 now with chronic proctitis: Currently appears to be stable.  Patient reports her bowel movements are at baseline.  No evidence of bowel obstruction on CT imaging.  Follows with GI, Dr. Havery Moros.  On Entyvio as an outpatient.  Hypertension: Resume home metoprolol once sepsis physiology improves.  DVT prophylaxis: Subcutaneous heparin Code Status: Full code, confirmed with patient Family Communication: Discussed with patient, she has discussed with family Disposition Plan: From home and likely discharge to home pending improvement in symptoms and sepsis physiology Consults called: None Level of care: Med-Surg Admission status:  Status is: Observation  The patient  remains OBS appropriate and will d/c before 2 midnights.  Dispo: The patient is from: Home              Anticipated d/c is to: Home              Patient currently is not medically stable to d/c.   Difficult to place patient No   Zada Finders MD [facility]  If 7PM-7AM, please contact night-coverage [email or web address]  12/08/2020, 12:13 AM

## 2020-12-07 NOTE — ED Provider Notes (Signed)
[facility] DEPT Provider Note   CSN: [id number] Arrival date & time: 12/07/20  1938     History Chief Complaint  Patient presents with  . Loss of Consciousness  . Emesis    [name] is a 65 y.o. female.  Patient is a 65 year old female who presents with generalized fatigue and not feeling well.  She is also had some nausea and vomiting today.  She has a history of Crohn's status post a total colectomy with resulting J-pouch.  She has had some chronic pouchitis and had a prior MRI that showed a fistulous tract from her J-pouch.  She was admitted for Covid and sepsis in January of this year.  She was previously admitted in December for C. difficile.  She says she has felt unwell for about the last week with fatigue and increased sleepiness.  She felt worse today and has had some ongoing vomiting.  Her boyfriend who is not currently bedside had told her that she possibly had 2 syncopal events.  Both of these were when she was laying in her bed and he tried to go check on her and found that she had passed out.  She did not have any seizure activity.  She was quickly aroused.  She has not had any falls or injuries from syncope.  She was noted to have a fever on arrival.  She complains of a little bit of increased pain in her J-pouch over the last week.  She does not report any change in her bowels.  No urinary symptoms.  She has had a little bit of coughing but thinks it is from her allergies.  No skin wounds.        Past Medical History:  Diagnosis Date  . Abdominal pain    Hx  . Allergy   . Anal stenosis   . Anemia   . Anxiety   . Arthritis   . Asthma    patient does not have inhaler  . Blood in stool    Hx  . Blood in urine    Hx  . Blood transfusion without reported diagnosis   . Cataract   . CKD (chronic kidney disease) stage 3, GFR 30-59 ml/min (HCC)   . Crohn's colitis, other complication ([place])   . De Quervain's tenosynovitis   .  Depression   . Difficulty urinating    Hx  . Easy bruising   . Esophagitis   . Fainting    History - resolved - due to dehydration  . Fatigue    Hx  . Fibroid    Hx  . Gastric polyp   . GERD (gastroesophageal reflux disease)   . 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 Hemorrhoids, external   . Hemorrhoids, internal   . Herpes, genital    vaginal treated 07/05/12 and pt states is resolved  . History of cervical dysplasia   . History of small bowel obstruction   . Hyperlipidemia    currently no meds  . Hyperparathyroidism   . Hypertension   . Hypokalemia    Hx  . 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 Obesity   . Ovarian cyst   . Poor dental hygiene   . Pulmonary nodule, right    right upper lobe  . Rectal bleeding    Hx  .  Rectal pain    Hx  . Renal insufficiency    CKD - stage 3  . RLS (restless legs syndrome)    no meds  . Tooth infection 11/2016   right low  . Ulcerative colitis   . Visual disturbance    wears glasses  . Weakness generalized    Hx - patient denies generalized weakness  . Wears dentures    upper only    Patient Active Problem List   Diagnosis Date Noted  . Closed fracture of left tibial plateau 10/30/2020  . Screening for osteoporosis 10/30/2020  . Diarrhea with dehydration 09/21/2020  . Crohn's disease of jejunum ([place]) 08/23/2020  . Chronic diarrhea 08/23/2020  . Metabolic acidosis 92/42/6834  . ARF (acute renal failure) ([place]) 08/23/2020  . Acute renal failure superimposed on stage 4 chronic kidney disease ([place]) 08/22/2020  . Hyperglycemia 02/26/2020  . Immunologic deficiency syndrome ([place]) 02/26/2020  . Prothrombin gene mutation ([place]) 02/26/2020  . Acute pain of left shoulder 06/04/2019  . Pes anserine bursitis 04/12/2018  . Pain of right thumb 04/12/2018  . Low back pain 06/21/2017  . Acute right-sided low back pain with  right-sided sciatica 06/21/2017  . Gout 06/21/2017  . Superficial thrombophlebitis of right upper extremity 03/23/2017  . Acute basilic vein thrombosis, right 02/16/2017  . Symptomatic anemia 01/15/2017  . 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 Rectal pain 12/01/2015  . IBD (inflammatory bowel disease) 10/24/2015  . Anal stricture 10/24/2015  . Gout of foot 10/29/2014  . Ovarian cyst, right 06/12/2014  . Acute on chronic renal failure ([place]) 04/02/2013  . Hydrosalpinx 02/14/2013  . Hyperparathyroidism ([place]) 09/18/2012  . HSV (herpes simplex virus) anogenital infection 06/28/2012  . CKD (chronic kidney disease), stage IV ([place]) 06/17/2012  . LLQ abdominal pain 06/22/2011  . RESTLESS LEGS SYNDROME 08/05/2009  . PULMONARY NODULE, RIGHT UPPER LOBE 02/28/2009  . TOBACCO ABUSE, HX OF 02/11/2009  . PAIN IN JOINT, MULTIPLE SITES 02/04/2009  . Nonspecific (abnormal) findings on radiological and other examination of body structure 12/23/2008  . CT, CHEST, ABNORMAL 12/23/2008  . HEMANGIOMA SIMPLEX 12/16/2008  . Pain in joint, pelvic region and thigh 12/16/2008  . SKIN RASH 12/16/2008  . TINEA CORPORIS 04/18/2008  . LATERAL EPICONDYLITIS, RIGHT 04/18/2008  . Morbid obesity ([place]) 02/05/2008  . ANEMIA 02/05/2008  . ANAL STENOSIS 02/05/2008  . Stool culture positive for Clostridioides difficile 02/05/2008  . Personal history of other endocrine, metabolic, and immunity disorders 02/05/2008  . Personal history of other diseases of digestive system 02/05/2008  . SYNCOPE AND COLLAPSE 02/02/2008  . Hyponatremia 12/15/2007  . Dehydration 12/15/2007  . HYPOKALEMIA 12/15/2007  . ANEMIA-IRON DEFICIENCY 12/15/2007  . LEUKOCYTOSIS 12/15/2007  . Anxiety state 12/15/2007  . RENAL INSUFFICIENCY 11/16/2007  . TINNITUS NOS 05/25/2007  . DE QUERVAIN'S TENOSYNOVITIS 05/25/2007  . Hyperlipidemia LDL goal <100 10/23/2006  . Depression 10/23/2006  . Essential hypertension 10/23/2006  . GERD 10/23/2006  .  Gastro-esophageal reflux disease without esophagitis 10/23/2006  . Ulcerative colitis without complications ([place]) 19/62/2297  . GASTRIC POLYP 11/16/2001  . GERD with esophagitis 11/16/2001    Past Surgical History:  Procedure Laterality Date  . ANAL DILATION    . CERVICAL BIOPSY  W/ LOOP ELECTRODE EXCISION    . CHOLECYSTECTOMY    . COLONOSCOPY     [name]  . fatty tumor removed from back     X 2  . HEMORRHOID SURGERY    . ILEOSTOMY CLOSURE    . RESTORATIVE PROCTOCOLECTOMY  with insertion of ileoanal J Pouch with loop ileostomy  . SHOULDER ARTHROSCOPY WITH CAPSULORRHAPHY Left 06/14/2019   Procedure: LEFT SHOULDER ARTHRSCOPIC REPAIR OF BONY BANKART FRACTURE;  Surgeon: Tania Ade, MD;  Location: [facility];  Service: Orthopedics;  Laterality: Left;  REQUEST 90 MINUTE  . SIGMOIDOSCOPY    . TOTAL ABDOMINAL HYSTERECTOMY  1998   TAH/LSO  . TUBAL LIGATION    . UPPER GASTROINTESTINAL ENDOSCOPY     [name]     OB History    Gravida  4   Para  2   Term  2   Preterm      AB  2   Living  2     SAB  2   IAB      Ectopic      Multiple      Live Births  2           Family History  Problem Relation Age of Onset  . Ulcerative colitis Father   . Hypertension Father   . Heart attack Father   . Hypertension Mother   . Heart disease Mother        s/p pci  . Ulcerative colitis Daughter   . Irritable bowel syndrome Other        grandchildren  . Diabetes Sister   . Cancer Sister        uterine  . Cancer Maternal Uncle        LUNG  . Colon cancer Neg Hx   . Esophageal cancer Neg Hx   . Stomach cancer Neg Hx   . Rectal cancer Neg Hx     Social History   Tobacco Use  . Smoking status: Former Smoker    Packs/day: 1.00    Years: 4.00    Pack years: 4.00    Types: Cigarettes    Quit date: 05/21/1979    Years since quitting: 41.5  . Smokeless tobacco: Never Used  Vaping Use  . Vaping Use: Never used  Substance Use Topics  . Alcohol use: No     Alcohol/week: 0.0 standard drinks  . Drug use: No    Home Medications Prior to Admission medications   Medication Sig Start Date End Date Taking? Authorizing Provider  allopurinol (ZYLOPRIM) 300 MG tablet TAKE 1 TABLET BY MOUTH EVERY DAY 12/01/20   Carollee Herter, Alferd Apa, DO  cholecalciferol (VITAMIN D) 25 MCG (1000 UNIT) tablet Take 1,000 Units by mouth daily.    [provider]  DULoxetine (CYMBALTA) 60 MG capsule Take 2 capsules (120 mg total) by mouth daily. 11/05/20   Ann Held, DO  famotidine (PEPCID) 20 MG tablet Take 1 tablet (20 mg total) by mouth 2 (two) times daily. 09/30/20   Armbruster, Carlota Raspberry, MD  folic acid (FOLVITE) 1 MG tablet Take 2 tablets (2 mg total) by mouth daily. Patient taking differently: Take 1 mg by mouth daily. 05/26/17   [name], MD  gabapentin (NEURONTIN) 100 MG capsule TAKE 1 CAPSULE BY MOUTH THREE TIMES A DAY Patient taking differently: Take 100 mg by mouth 2 (two) times daily. 05/01/20   Ann Held, DO  methylcellulose (CITRUCEL) oral powder Use as directed, daily 10/03/20   Armbruster, Carlota Raspberry, MD  metoprolol succinate (TOPROL-XL) 100 MG 24 hr tablet Take 50-100 mg by mouth See admin instructions. Take 100 mg in the a.m. and 50 mg in the evening    [provider]  vedolizumab (ENTYVIO) 300 MG injection Inject  300 mg into the vein every 30 (thirty) days.    [provider]  Vitamin D, Ergocalciferol, (DRISDOL) 1.25 MG (50000 UNIT) CAPS capsule Take 50,000 Units by mouth every 30 (thirty) days.    [provider]    Allergies    Sulfa antibiotics, Morphine, and Sulfonamide derivatives  Review of Systems   Review of Systems  Constitutional: Positive for fatigue and fever. Negative for chills and diaphoresis.  HENT: Negative for congestion, rhinorrhea and sneezing.   Eyes: Negative.   Respiratory: Positive for cough. Negative for chest tightness and shortness of breath.   Cardiovascular:  Negative for chest pain and leg swelling.  Gastrointestinal: Positive for abdominal pain, nausea and vomiting. Negative for blood in stool and diarrhea.  Genitourinary: Negative for difficulty urinating, flank pain, frequency and hematuria.  Musculoskeletal: Negative for arthralgias and back pain.  Skin: Negative for rash.  Neurological: Positive for light-headedness. Negative for dizziness, speech difficulty, weakness, numbness and headaches.    Physical Exam Updated Vital Signs BP (!) 144/93   Pulse (!) 106   Temp 100.3 F (37.9 C) (Oral)   Resp 17   Ht 5' 3"  (1.6 m)   Wt 90.7 kg   SpO2 97%   BMI 35.42 kg/m   Physical Exam Constitutional:      Appearance: She is well-developed.  HENT:     Head: Normocephalic and atraumatic.  Eyes:     Pupils: Pupils are equal, round, and reactive to light.  Cardiovascular:     Rate and Rhythm: Regular rhythm. Tachycardia present.     Heart sounds: Normal heart sounds.  Pulmonary:     Effort: Pulmonary effort is normal. No respiratory distress.     Breath sounds: Normal breath sounds. No wheezing or rales.  Chest:     Chest wall: No tenderness.  Abdominal:     General: Bowel sounds are normal.     Palpations: Abdomen is soft.     Tenderness: There is abdominal tenderness (Tenderness to lower abdomen). There is no guarding or rebound.  Musculoskeletal:        General: Normal range of motion.     Cervical back: Normal range of motion and neck supple.  Lymphadenopathy:     Cervical: No cervical adenopathy.  Skin:    General: Skin is warm and dry.     Findings: No rash.  Neurological:     Mental Status: She is alert and oriented to person, place, and time.     ED Results / Procedures / Treatments   Labs (all labs ordered are listed, but only abnormal results are displayed) Labs Reviewed  COMPREHENSIVE METABOLIC PANEL - Abnormal; Notable for the following components:      Result Value   Sodium 132 (*)    CO2 20 (*)    Glucose,  Bld 104 (*)    BUN 35 (*)    Creatinine, Ser 2.66 (*)    Total Bilirubin 1.3 (*)    GFR, Estimated 19 (*)    All other components within normal limits  CBC WITH DIFFERENTIAL/PLATELET - Abnormal; Notable for the following components:   WBC 17.0 (*)    RBC 3.42 (*)    Hemoglobin 11.2 (*)    HCT 35.4 (*)    MCV 103.5 (*)    Neutro Abs 14.6 (*)    Monocytes Absolute 1.1 (*)    Abs Immature Granulocytes 0.11 (*)    All other components within normal limits  URINALYSIS, ROUTINE W REFLEX MICROSCOPIC -  Abnormal; Notable for the following components:   APPearance HAZY (*)    Hgb urine dipstick MODERATE (*)    Protein, ur 100 (*)    Nitrite POSITIVE (*)    Leukocytes,Ua SMALL (*)    All other components within normal limits  URINALYSIS, MICROSCOPIC (REFLEX) - Abnormal; Notable for the following components:   Bacteria, UA FEW (*)    All other components within normal limits  RESP PANEL BY RT-PCR (FLU A&B, COVID) ARPGX2  CULTURE, BLOOD (SINGLE)  URINE CULTURE  CULTURE, BLOOD (ROUTINE X 2) W REFLEX TO ID PANEL  LACTIC ACID, PLASMA  PROTIME-INR  APTT    EKG None ED ECG REPORT   Date: 12/07/2020  Rate: 123  Rhythm: sinus tachycardia  QRS Axis: left  Intervals: normal  ST/T Wave abnormalities: nonspecific T wave changes  Conduction Disutrbances:left anterior fascicular block  Narrative Interpretation:   Old EKG Reviewed: changes noted  I have personally reviewed the EKG tracing and agree with the computerized printout as noted.   Radiology CT ABDOMEN PELVIS WO CONTRAST  Result Date: 12/07/2020 CLINICAL DATA:  65 year old female with abdominal pain and fever. EXAM: CT ABDOMEN AND PELVIS WITHOUT CONTRAST TECHNIQUE: Multidetector CT imaging of the abdomen and pelvis was performed following the standard protocol without IV contrast. COMPARISON:  CT abdomen pelvis dated 04/28/2011. FINDINGS: Evaluation of this exam is limited in the absence of intravenous contrast. Lower chest: The  visualized lung bases are clear. No intra-abdominal free air or free fluid. Hepatobiliary: The liver is unremarkable. Cholecystectomy. No retained calcified stone noted in the central CBD. Pancreas: Unremarkable. No pancreatic ductal dilatation or surrounding inflammatory changes. Spleen: Normal in size without focal abnormality. Adrenals/Urinary Tract: The adrenal glands unremarkable. There is a punctate nonobstructing left renal interpolar calculus versus vascular calcification. There is minimal fullness of the left renal collecting system. No obstructing stone identified. There is left perinephric stranding. Findings may be related to a recently passed left renal calculus versus pyelonephritis. Correlation with urinalysis recommended. No drainable fluid collection or abscess. The right kidney, right ureter, and urinary bladder appear unremarkable. Stomach/Bowel: Postsurgical changes of the proctocolectomy with anastomotic sutures in the pelvis. There is no bowel obstruction or active inflammation. Vascular/Lymphatic: Mild atherosclerotic calcification of the abdominal aorta. The IVC is unremarkable. No portal venous gas. There is no adenopathy. Reproductive: Hysterectomy. There is a 2 cm right adnexal cyst. No follow-up imaging recommended. Note: This recommendation does not apply to premenarchal patients and to those with increased risk (genetic, family history, elevated tumor markers or other high-risk factors) of ovarian cancer. Reference: JACR 2020 Feb; 17(2):248-254 Other: Midline vertical anterior abdominal wall incisional scar. Musculoskeletal: Degenerative changes of the spine. No acute osseous pathology. IMPRESSION: 1. Minimal fullness of the left renal collecting system with left perinephric stranding. No obstructing stone identified. Findings may be related to a recently passed left renal calculus versus pyelonephritis. Correlation with urinalysis recommended. No drainable fluid collection or abscess.  2. Postsurgical changes of the proctocolectomy. No bowel obstruction. 3. Aortic Atherosclerosis (ICD10-I70.0). Electronically Signed   By: Anner Crete M.D.   On: 12/07/2020 23:06   DG Chest Port 1 View  Result Date: 12/07/2020 CLINICAL DATA:  Questionable sepsis EXAM: PORTABLE CHEST 1 VIEW COMPARISON:  09/21/2020 FINDINGS: The heart size and mediastinal contours are within normal limits. Both lungs are clear. The visualized skeletal structures are unremarkable. IMPRESSION: Normal study. Electronically Signed   By: Rolm Baptise M.D.   On: 12/07/2020 20:52    Procedures Procedures  Medications Ordered in ED Medications  metroNIDAZOLE (FLAGYL) IVPB 500 mg (500 mg Intravenous New Bag/Given 12/07/20 2308)  vancomycin (VANCOREADY) IVPB 2000 mg/400 mL (has no administration in time range)  lactated ringers bolus 1,000 mL (1,000 mLs Intravenous New Bag/Given 12/07/20 2107)  ceFEPIme (MAXIPIME) 2 g in sodium chloride 0.9 % 100 mL IVPB (0 g Intravenous Stopped 12/07/20 2308)    ED Course  I have reviewed the triage vital signs and the nursing notes.  Pertinent labs & imaging results that were available during my care of the patient were reviewed by me and considered in my medical decision making (see chart for details).    MDM Rules/Calculators/A&P                          Patient is a 65 year old female who presents with fever and feeling unwell.  She also had a little bit of increased abdominal pain.  She had a temperature of 100.3 on arrival.  Her WBC count came back elevated.  Her creatinine is slightly elevated as compared to her baseline values.  Code sepsis was called and she was started on IV fluids and antibiotics.  Her lactate is normal.  Her chest x-ray does not show any suggestions of pneumonia.  She had a CT scan of her abdomen pelvis which showed some suggestions of pyelonephritis.  Her urinalysis appears to be infected which is the likely source.  She was tachycardic on arrival.  Her  heart rate has improved but is still high in the low 100s.  We will consult the hospitalist for admission.  I spoke with Dr. Posey Pronto who will admit the patient.  CRITICAL CARE Performed by: Malvin Johns Total critical care time: 60 minutes Critical care time was exclusive of separately billable procedures and treating other patients. Critical care was necessary to treat or prevent imminent or life-threatening deterioration. Critical care was time spent personally by me on the following activities: development of treatment plan with patient and/or surrogate as well as nursing, discussions with consultants, evaluation of patient's response to treatment, examination of patient, obtaining history from patient or surrogate, ordering and performing treatments and interventions, ordering and review of laboratory studies, ordering and review of radiographic studies, pulse oximetry and re-evaluation of patient's condition.  Final Clinical Impression(s) / ED Diagnoses Final diagnoses:  Sepsis, due to unspecified organism, unspecified whether acute organ dysfunction present [facility])  Pyelonephritis    Rx / DC Orders ED Discharge Orders    None       Malvin Johns, MD 12/07/20 [phone]

## 2020-12-07 NOTE — ED Triage Notes (Signed)
Patient reports N/V today. Visitor reports multiple syncopal episodes today. Patient c/o feeling tired.

## 2020-12-07 NOTE — Progress Notes (Signed)
A consult was received from an ED physician for Vancomycin & Cefpeime per pharmacy dosing.  The patient's profile has been reviewed for ht/wt/allergies/indication/available labs.   A one time order has been placed for Vancomycin 2gm & Cefepime 2gm IV.  Further antibiotics/pharmacy consults should be ordered by admitting physician if indicated.                       Thank you, Netta Cedars PharmD 12/07/2020  10:19 PM

## 2020-12-08 ENCOUNTER — Encounter (HOSPITAL_COMMUNITY): Payer: Self-pay | Admitting: Internal Medicine

## 2020-12-08 DIAGNOSIS — N179 Acute kidney failure, unspecified: Secondary | ICD-10-CM | POA: Diagnosis present

## 2020-12-08 DIAGNOSIS — Z79899 Other long term (current) drug therapy: Secondary | ICD-10-CM | POA: Diagnosis not present

## 2020-12-08 DIAGNOSIS — Z90710 Acquired absence of both cervix and uterus: Secondary | ICD-10-CM | POA: Diagnosis not present

## 2020-12-08 DIAGNOSIS — A419 Sepsis, unspecified organism: Secondary | ICD-10-CM | POA: Diagnosis not present

## 2020-12-08 DIAGNOSIS — G2581 Restless legs syndrome: Secondary | ICD-10-CM | POA: Diagnosis present

## 2020-12-08 DIAGNOSIS — A4151 Sepsis due to Escherichia coli [E. coli]: Secondary | ICD-10-CM | POA: Diagnosis present

## 2020-12-08 DIAGNOSIS — N184 Chronic kidney disease, stage 4 (severe): Secondary | ICD-10-CM | POA: Diagnosis present

## 2020-12-08 DIAGNOSIS — Z8616 Personal history of COVID-19: Secondary | ICD-10-CM | POA: Diagnosis not present

## 2020-12-08 DIAGNOSIS — E785 Hyperlipidemia, unspecified: Secondary | ICD-10-CM | POA: Diagnosis present

## 2020-12-08 DIAGNOSIS — Z87891 Personal history of nicotine dependence: Secondary | ICD-10-CM | POA: Diagnosis not present

## 2020-12-08 DIAGNOSIS — Z20822 Contact with and (suspected) exposure to covid-19: Secondary | ICD-10-CM | POA: Diagnosis present

## 2020-12-08 DIAGNOSIS — Z9049 Acquired absence of other specified parts of digestive tract: Secondary | ICD-10-CM | POA: Diagnosis not present

## 2020-12-08 DIAGNOSIS — I129 Hypertensive chronic kidney disease with stage 1 through stage 4 chronic kidney disease, or unspecified chronic kidney disease: Secondary | ICD-10-CM | POA: Diagnosis present

## 2020-12-08 DIAGNOSIS — N10 Acute tubulo-interstitial nephritis: Secondary | ICD-10-CM | POA: Diagnosis present

## 2020-12-08 DIAGNOSIS — H9192 Unspecified hearing loss, left ear: Secondary | ICD-10-CM | POA: Diagnosis present

## 2020-12-08 DIAGNOSIS — D6852 Prothrombin gene mutation: Secondary | ICD-10-CM | POA: Diagnosis present

## 2020-12-08 DIAGNOSIS — I10 Essential (primary) hypertension: Secondary | ICD-10-CM | POA: Diagnosis not present

## 2020-12-08 DIAGNOSIS — F32A Depression, unspecified: Secondary | ICD-10-CM | POA: Diagnosis present

## 2020-12-08 DIAGNOSIS — K5010 Crohn's disease of large intestine without complications: Secondary | ICD-10-CM | POA: Diagnosis present

## 2020-12-08 DIAGNOSIS — E669 Obesity, unspecified: Secondary | ICD-10-CM | POA: Diagnosis present

## 2020-12-08 DIAGNOSIS — N12 Tubulo-interstitial nephritis, not specified as acute or chronic: Secondary | ICD-10-CM | POA: Diagnosis present

## 2020-12-08 DIAGNOSIS — R911 Solitary pulmonary nodule: Secondary | ICD-10-CM | POA: Diagnosis present

## 2020-12-08 DIAGNOSIS — R6520 Severe sepsis without septic shock: Secondary | ICD-10-CM | POA: Diagnosis present

## 2020-12-08 DIAGNOSIS — K2100 Gastro-esophageal reflux disease with esophagitis, without bleeding: Secondary | ICD-10-CM | POA: Diagnosis present

## 2020-12-08 DIAGNOSIS — D631 Anemia in chronic kidney disease: Secondary | ICD-10-CM | POA: Diagnosis present

## 2020-12-08 DIAGNOSIS — E871 Hypo-osmolality and hyponatremia: Secondary | ICD-10-CM | POA: Diagnosis present

## 2020-12-08 LAB — BLOOD CULTURE ID PANEL (REFLEXED) - BCID2
A.calcoaceticus-baumannii: NOT DETECTED
Bacteroides fragilis: NOT DETECTED
CTX-M ESBL: NOT DETECTED
Candida albicans: NOT DETECTED
Candida auris: NOT DETECTED
Candida glabrata: NOT DETECTED
Candida krusei: NOT DETECTED
Candida parapsilosis: NOT DETECTED
Candida tropicalis: NOT DETECTED
Carbapenem resist OXA 48 LIKE: NOT DETECTED
Carbapenem resistance IMP: NOT DETECTED
Carbapenem resistance KPC: NOT DETECTED
Carbapenem resistance NDM: NOT DETECTED
Carbapenem resistance VIM: NOT DETECTED
Cryptococcus neoformans/gattii: NOT DETECTED
Enterobacter cloacae complex: NOT DETECTED
Enterococcus Faecium: NOT DETECTED
Enterococcus faecalis: NOT DETECTED
Escherichia coli: DETECTED — AB
Haemophilus influenzae: NOT DETECTED
Klebsiella aerogenes: NOT DETECTED
Klebsiella oxytoca: NOT DETECTED
Klebsiella pneumoniae: NOT DETECTED
Listeria monocytogenes: NOT DETECTED
Neisseria meningitidis: NOT DETECTED
Proteus species: NOT DETECTED
Pseudomonas aeruginosa: NOT DETECTED
Salmonella species: NOT DETECTED
Serratia marcescens: NOT DETECTED
Staphylococcus aureus (BCID): NOT DETECTED
Staphylococcus epidermidis: NOT DETECTED
Staphylococcus lugdunensis: NOT DETECTED
Staphylococcus species: NOT DETECTED
Stenotrophomonas maltophilia: NOT DETECTED
Streptococcus agalactiae: NOT DETECTED
Streptococcus pneumoniae: NOT DETECTED
Streptococcus pyogenes: NOT DETECTED
Streptococcus species: NOT DETECTED

## 2020-12-08 LAB — MAGNESIUM: Magnesium: 1.2 mg/dL — ABNORMAL LOW (ref 1.7–2.4)

## 2020-12-08 LAB — BASIC METABOLIC PANEL
Anion gap: 8 (ref 5–15)
BUN: 35 mg/dL — ABNORMAL HIGH (ref 8–23)
CO2: 23 mmol/L (ref 22–32)
Calcium: 8.6 mg/dL — ABNORMAL LOW (ref 8.9–10.3)
Chloride: 103 mmol/L (ref 98–111)
Creatinine, Ser: 2.58 mg/dL — ABNORMAL HIGH (ref 0.44–1.00)
GFR, Estimated: 20 mL/min — ABNORMAL LOW (ref >60)
Glucose, Bld: 126 mg/dL — ABNORMAL HIGH (ref 70–99)
Potassium: 3.9 mmol/L (ref 3.5–5.1)
Sodium: 134 mmol/L — ABNORMAL LOW (ref 135–145)

## 2020-12-08 LAB — CBC
HCT: 29 % — ABNORMAL LOW (ref 36.0–46.0)
Hemoglobin: 9.2 g/dL — ABNORMAL LOW (ref 12.0–15.0)
MCH: 33 pg (ref 26.0–34.0)
MCHC: 31.7 g/dL (ref 30.0–36.0)
MCV: 103.9 fL — ABNORMAL HIGH (ref 80.0–100.0)
Platelets: 295 10*3/uL (ref 150–400)
RBC: 2.79 MIL/uL — ABNORMAL LOW (ref 3.87–5.11)
RDW: 14.1 % (ref 11.5–15.5)
WBC: 13.5 10*3/uL — ABNORMAL HIGH (ref 4.0–10.5)
nRBC: 0 % (ref 0.0–0.2)

## 2020-12-08 LAB — PROCALCITONIN: Procalcitonin: 16.32 ng/mL

## 2020-12-08 MED ORDER — ACETAMINOPHEN 650 MG RE SUPP: 650.0000 mg | Freq: Four times a day (QID) | RECTAL | Status: DC | PRN

## 2020-12-08 MED ORDER — ONDANSETRON HCL 4 MG/2ML IJ SOLN: 4.0000 mg | Freq: Four times a day (QID) | INTRAMUSCULAR | Status: DC | PRN

## 2020-12-08 MED ORDER — LOPERAMIDE HCL 2 MG PO CAPS
2.0000 mg | ORAL_CAPSULE | ORAL | Status: AC | PRN
  Administered 2020-12-08: 2 mg via ORAL
  Filled 2020-12-08: qty 1

## 2020-12-08 MED ORDER — HEPARIN SODIUM (PORCINE) 5000 UNIT/ML IJ SOLN
5000.0000 [IU] | Freq: Three times a day (TID) | INTRAMUSCULAR | Status: DC
  Administered 2020-12-08 – 2020-12-10 (×8): 5000 [IU] via SUBCUTANEOUS
  Filled 2020-12-08 (×7): qty 1

## 2020-12-08 MED ORDER — SODIUM CHLORIDE 0.9 % IV SOLN
2.0000 g | Freq: Every day | INTRAVENOUS | Status: DC
  Administered 2020-12-08 – 2020-12-10 (×3): 2 g via INTRAVENOUS
  Filled 2020-12-08 (×3): qty 20

## 2020-12-08 MED ORDER — FAMOTIDINE 20 MG PO TABS
20.0000 mg | ORAL_TABLET | Freq: Every day | ORAL | Status: DC
  Administered 2020-12-08 – 2020-12-10 (×3): 20 mg via ORAL
  Filled 2020-12-08 (×3): qty 1

## 2020-12-08 MED ORDER — ONDANSETRON HCL 4 MG PO TABS: 4.0000 mg | ORAL_TABLET | Freq: Four times a day (QID) | ORAL | Status: DC | PRN

## 2020-12-08 MED ORDER — MAGNESIUM SULFATE 2 GM/50ML IV SOLN
2.0000 g | Freq: Once | INTRAVENOUS | Status: AC
  Administered 2020-12-08: 2 g via INTRAVENOUS
  Filled 2020-12-08: qty 50

## 2020-12-08 MED ORDER — GABAPENTIN 100 MG PO CAPS
100.0000 mg | ORAL_CAPSULE | Freq: Two times a day (BID) | ORAL | Status: DC
  Administered 2020-12-08 – 2020-12-09 (×3): 100 mg via ORAL
  Filled 2020-12-08 (×3): qty 1

## 2020-12-08 MED ORDER — METOPROLOL SUCCINATE ER 100 MG PO TB24
100.0000 mg | ORAL_TABLET | Freq: Every day | ORAL | Status: DC
  Administered 2020-12-08 – 2020-12-09 (×2): 100 mg via ORAL
  Filled 2020-12-08 (×2): qty 1

## 2020-12-08 MED ORDER — DULOXETINE HCL 60 MG PO CPEP
120.0000 mg | ORAL_CAPSULE | Freq: Every day | ORAL | Status: DC
  Administered 2020-12-08 – 2020-12-10 (×3): 120 mg via ORAL
  Filled 2020-12-08 (×3): qty 2

## 2020-12-08 MED ORDER — LACTATED RINGERS IV SOLN: INTRAVENOUS | Status: DC

## 2020-12-08 MED ORDER — SODIUM CHLORIDE 0.9 % IV SOLN: 1.0000 g | INTRAVENOUS | Status: DC

## 2020-12-08 MED ORDER — ACETAMINOPHEN 325 MG PO TABS
650.0000 mg | ORAL_TABLET | Freq: Four times a day (QID) | ORAL | Status: DC | PRN
  Administered 2020-12-08: 650 mg via ORAL
  Filled 2020-12-08: qty 2

## 2020-12-08 NOTE — Progress Notes (Signed)
PROGRESS NOTE  Mariajose Mow  KDT:[id number] DOB: [birth year] DOA: 12/07/2020 PCP: Ann Held, DO  Brief Narrative: Marquise Lambson is a 65 y.o. female with a history of IBD s/p colectomy and ileoproctostomy with J pouch revision and chronic pouchitis, stage IV CKD, recurrent C. difficile colitis, HTN, and covid-19 in January 2022 who presented to the ED 4/3 with fatigue, nausea and vomiting with increasing urinary frequency (acute on chronic) associated with chills, left abdominal/flank pain. She had low grade fever 100.51F, tachycardic with WBC 17k, SCr 2.66 (prior 2.14), lactic acid 1.9 with nitrite-positive pyuria and bacteriuria by micro. CT A/P demonstrated left perinephric stranding with fullness in the left renal collecting system without obstructive stone or drainable fluid collection. No bowel obstruction or other acute findings. Urine and blood cultures drawn, broad antibiotics given initially, narrowed to ceftriaxone, and the patient was admitted.   Assessment & Plan: Principal Problem:   Severe sepsis ([place]) Active Problems:   Essential hypertension   IBD (inflammatory bowel disease)   Acute renal failure superimposed on stage 4 chronic kidney disease (HCC)   Acute pyelonephritis  Severe sepsis due to left pyelonephritis: PCT 16, WBC 17 > 13.5, still febrile this AM.  - Continue ceftriaxone pending urine and blood cultures.  - Continue IV fluids, start diet  AKI on stage IV CKD: Mild, likely due to sepsis.  - Continue IVF while attempting to advance diet.  Crohn's disease s/p colectomy, ileoproctostomy and J-pouch revision 1998 with chronic proctitis: BMs are loose but no change from baseline. Followed by [facility] and Dr. Havery Moros as outpatient considering ileostomy. CT without acute changes.  - Supportive care - Entyvio as outpatient  Hypomagnesemia:  - Supplement and monitor  HTN:  - Restart metoprolol at 13m qAM dosing. If BP and HR remain elevated, will also  restart PM 556mdose.   Obesity: Estimated body mass index is 34.41 kg/m as calculated from the following:   Height as of this encounter: 5' 3"  (1.6 m).   Weight as of this encounter: 88.1 kg.  History of covid-19 infection: Negative PCR at admission, no isolation required.   History of C. diff colitis: Symptoms are at baseline not indicative of recurrence at this time.   DVT prophylaxis: Heparin Code Status: Full Family Communication: None at bedside Disposition Plan:  Status is: Observation  The patient will require care spanning > 2 midnights and should be moved to inpatient because: Persistent severe electrolyte disturbances and IV treatments appropriate due to intensity of illness or inability to take PO  Dispo: The patient is from: Home              Anticipated d/c is to: Home              Patient currently is not medically stable to d/c.   Difficult to place patient No  Consultants:   None  Procedures:   None  Antimicrobials:  Vancomycin, cefepime, flagyl 4/3  Ceftriaxone 4/3 >>    Subjective: Wants to drink ice water, feels less nauseated than at admission, still with soreness on left abd/flank and continued urinary urgency/incontinence. Febrile to 101 this AM. Just had incontinent episode of urinary urgency.  Objective: Vitals:   12/08/20 0220 12/08/20 0224 12/08/20 0614 12/08/20 0625  BP: 138/80  (!) 142/71   Pulse: (!) 103  (!) 113   Resp: 20  19   Temp: 98.2 F (36.8 C)  (!) 100.9 F (38.3 C) (!) 101 F (38.3  C)  TempSrc: Oral  Oral Oral  SpO2: 100%  97%   Weight:  88.1 kg    Height:  5' 3"  (1.6 m)     No intake or output data in the 24 hours ending 12/08/20 0817 Filed Weights   12/07/20 2045 12/08/20 0224  Weight: 90.7 kg 88.1 kg    Gen: Chronically ill-appearing female in no distress Pulm: Non-labored breathing. Clear to auscultation bilaterally.  CV: Regular tachycardia. No murmur, rub, or gallop. No JVD, no pitting pedal edema. GI:  Abdomen soft, generally uncomfortable without rebound or guarding, no CVA tenderness, non-distended, with normoactive bowel sounds. No organomegaly or masses felt. Ext: Warm, no deformities Skin: No rashes, lesions or ulcers on visualized skin. Declines exam of pouch at this time.  Neuro: Alert and oriented. No focal neurological deficits. Psych: Judgement and insight appear normal. Mood & affect appropriate.   Data Reviewed: I have personally reviewed following labs and imaging studies  CBC: Recent Labs  Lab 12/07/20 2056 12/08/20 0305  WBC 17.0* 13.5*  NEUTROABS 14.6*  --   HGB 11.2* 9.2*  HCT 35.4* 29.0*  MCV 103.5* 103.9*  PLT 370 073   Basic Metabolic Panel: Recent Labs  Lab 12/07/20 2056 12/08/20 0305  NA 132* 134*  K 4.3 3.9  CL 101 103  CO2 20* 23  GLUCOSE 104* 126*  BUN 35* 35*  CREATININE 2.66* 2.58*  CALCIUM 9.1 8.6*  MG  --  1.2*   GFR: Estimated Creatinine Clearance: 22.6 mL/min (A) (by C-G formula based on SCr of 2.58 mg/dL (H)). Liver Function Tests: Recent Labs  Lab 12/07/20 2056  AST 23  ALT 17  ALKPHOS 94  BILITOT 1.3*  PROT 8.1  ALBUMIN 3.8   No results for input(s): LIPASE, AMYLASE in the last 168 hours. No results for input(s): AMMONIA in the last 168 hours. Coagulation Profile: Recent Labs  Lab 12/07/20 2056  INR 1.0   Cardiac Enzymes: No results for input(s): CKTOTAL, CKMB, CKMBINDEX, TROPONINI in the last 168 hours. BNP (last 3 results) No results for input(s): PROBNP in the last 8760 hours. HbA1C: No results for input(s): HGBA1C in the last 72 hours. CBG: No results for input(s): GLUCAP in the last 168 hours. Lipid Profile: No results for input(s): CHOL, HDL, LDLCALC, TRIG, CHOLHDL, LDLDIRECT in the last 72 hours. Thyroid Function Tests: No results for input(s): TSH, T4TOTAL, FREET4, T3FREE, THYROIDAB in the last 72 hours. Anemia Panel: No results for input(s): VITAMINB12, FOLATE, FERRITIN, TIBC, IRON, RETICCTPCT in the  last 72 hours. Urine analysis:    Component Value Date/Time   COLORURINE YELLOW 12/07/2020 2141   APPEARANCEUR HAZY (A) 12/07/2020 2141   LABSPEC 1.020 12/07/2020 2141   PHURINE 6.0 12/07/2020 2141   GLUCOSEU NEGATIVE 12/07/2020 2141   GLUCOSEU NEGATIVE 03/14/2009 1603   HGBUR MODERATE (A) 12/07/2020 2141   BILIRUBINUR NEGATIVE 12/07/2020 2141   BILIRUBINUR Negative 01/06/2018 1306   KETONESUR NEGATIVE 12/07/2020 2141   PROTEINUR 100 (A) 12/07/2020 2141   UROBILINOGEN 1.0 01/06/2018 1306   UROBILINOGEN 0.2 04/12/2013 0900   NITRITE POSITIVE (A) 12/07/2020 2141   LEUKOCYTESUR SMALL (A) 12/07/2020 2141   Recent Results (from the past 240 hour(s))  Resp Panel by RT-PCR (Flu A&B, Covid) Nasopharyngeal Swab     Status: None   Collection Time: 12/07/20  9:46 PM   Specimen: Nasopharyngeal Swab; Nasopharyngeal(NP) swabs in vial transport medium  Result Value Ref Range Status   SARS Coronavirus 2 by RT PCR NEGATIVE NEGATIVE Final  Comment: (NOTE) SARS-CoV-2 target nucleic acids are NOT DETECTED.  The SARS-CoV-2 RNA is generally detectable in upper respiratory specimens during the acute phase of infection. The lowest concentration of SARS-CoV-2 viral copies this assay can detect is 138 copies/mL. A negative result does not preclude SARS-Cov-2 infection and should not be used as the sole basis for treatment or other patient management decisions. A negative result may occur with  improper specimen collection/handling, submission of specimen other than nasopharyngeal swab, presence of viral mutation(s) within the areas targeted by this assay, and inadequate number of viral copies(<138 copies/mL). A negative result must be combined with clinical observations, patient history, and epidemiological information. The expected result is Negative.  Fact Sheet for Patients:  EntrepreneurPulse.com.au  Fact Sheet for Healthcare Providers:   IncredibleEmployment.be  This test is no t yet approved or cleared by the [place] FDA and  has been authorized for detection and/or diagnosis of SARS-CoV-2 by FDA under an Emergency Use Authorization (EUA). This EUA will remain  in effect (meaning this test can be used) for the duration of the COVID-19 declaration under Section 564(b)(1) of the Act, 21 U.S.C.section 360bbb-3(b)(1), unless the authorization is terminated  or revoked sooner.       Influenza A by PCR NEGATIVE NEGATIVE Final   Influenza B by PCR NEGATIVE NEGATIVE Final    Comment: (NOTE) The Xpert Xpress SARS-CoV-2/FLU/RSV plus assay is intended as an aid in the diagnosis of influenza from Nasopharyngeal swab specimens and should not be used as a sole basis for treatment. Nasal washings and aspirates are unacceptable for Xpert Xpress SARS-CoV-2/FLU/RSV testing.  Fact Sheet for Patients: EntrepreneurPulse.com.au  Fact Sheet for Healthcare Providers: IncredibleEmployment.be  This test is not yet approved or cleared by the [place] FDA and has been authorized for detection and/or diagnosis of SARS-CoV-2 by FDA under an Emergency Use Authorization (EUA). This EUA will remain in effect (meaning this test can be used) for the duration of the COVID-19 declaration under Section 564(b)(1) of the Act, 21 U.S.C. section 360bbb-3(b)(1), unless the authorization is terminated or revoked.  Performed at [facility], [place] [address], [place], [place] [address]       Radiology Studies: CT ABDOMEN PELVIS WO CONTRAST  Result Date: 12/07/2020 CLINICAL DATA:  65 year old female with abdominal pain and fever. EXAM: CT ABDOMEN AND PELVIS WITHOUT CONTRAST TECHNIQUE: Multidetector CT imaging of the abdomen and pelvis was performed following the standard protocol without IV contrast. COMPARISON:  CT abdomen pelvis dated 04/28/2011. FINDINGS: Evaluation  of this exam is limited in the absence of intravenous contrast. Lower chest: The visualized lung bases are clear. No intra-abdominal free air or free fluid. Hepatobiliary: The liver is unremarkable. Cholecystectomy. No retained calcified stone noted in the central CBD. Pancreas: Unremarkable. No pancreatic ductal dilatation or surrounding inflammatory changes. Spleen: Normal in size without focal abnormality. Adrenals/Urinary Tract: The adrenal glands unremarkable. There is a punctate nonobstructing left renal interpolar calculus versus vascular calcification. There is minimal fullness of the left renal collecting system. No obstructing stone identified. There is left perinephric stranding. Findings may be related to a recently passed left renal calculus versus pyelonephritis. Correlation with urinalysis recommended. No drainable fluid collection or abscess. The right kidney, right ureter, and urinary bladder appear unremarkable. Stomach/Bowel: Postsurgical changes of the proctocolectomy with anastomotic sutures in the pelvis. There is no bowel obstruction or active inflammation. Vascular/Lymphatic: Mild atherosclerotic calcification of the abdominal aorta. The IVC is unremarkable. No portal venous gas. There is no adenopathy.  Reproductive: Hysterectomy. There is a 2 cm right adnexal cyst. No follow-up imaging recommended. Note: This recommendation does not apply to premenarchal patients and to those with increased risk (genetic, family history, elevated tumor markers or other high-risk factors) of ovarian cancer. Reference: JACR 2020 Feb; 17(2):248-254 Other: Midline vertical anterior abdominal wall incisional scar. Musculoskeletal: Degenerative changes of the spine. No acute osseous pathology. IMPRESSION: 1. Minimal fullness of the left renal collecting system with left perinephric stranding. No obstructing stone identified. Findings may be related to a recently passed left renal calculus versus pyelonephritis.  Correlation with urinalysis recommended. No drainable fluid collection or abscess. 2. Postsurgical changes of the proctocolectomy. No bowel obstruction. 3. Aortic Atherosclerosis (ICD10-I70.0). Electronically Signed   By: Anner Crete M.D.   On: 12/07/2020 23:06   DG Chest Port 1 View  Result Date: 12/07/2020 CLINICAL DATA:  Questionable sepsis EXAM: PORTABLE CHEST 1 VIEW COMPARISON:  09/21/2020 FINDINGS: The heart size and mediastinal contours are within normal limits. Both lungs are clear. The visualized skeletal structures are unremarkable. IMPRESSION: Normal study. Electronically Signed   By: Rolm Baptise M.D.   On: 12/07/2020 20:52    Scheduled Meds: . DULoxetine  120 mg Oral Daily  . famotidine  20 mg Oral Daily  . gabapentin  100 mg Oral BID  . heparin  5,000 Units Subcutaneous Q8H   Continuous Infusions: . cefTRIAXone (ROCEPHIN)  IV    . lactated ringers 125 mL/hr at 12/08/20 0230     LOS: 0 days   Time spent: 25 minutes.  Patrecia Pour, MD [facility] [email or web address] 12/08/2020, 8:17 AM

## 2020-12-08 NOTE — Progress Notes (Signed)
PHARMACY - PHYSICIAN COMMUNICATION CRITICAL VALUE ALERT - BLOOD CULTURE IDENTIFICATION (BCID)  [name] is an 65 y.o. female who presented to [facility] on 12/07/2020 with a chief complaint of nausea, vomiting, fatigue with increasing urinary frequency (acute on chronic) associated with chills, left abdominal/flank pain.   Assessment:  Severe sepsis due to left pyelonephritis.  Vancomycin, cefepime, flagyl administered x 1 on 4/3.  Today, antibiotic regimen narrowed to ceftriaxone 1 g IV every 24 hours.  This afternoon, two anaerobic out of 4 bottles reported with gram negative rods, Escherichia coli detected with no resistance.    Name of physician (or Provider) Contacted: Bonner Puna  Current antibiotics: Ceftriaxone 1 g IV every 24 hours  Changes to prescribed antibiotics recommended:  Increase ceftriaxone to 2 g IV every 24 hours  Results for orders placed or performed during the hospital encounter of 12/07/20  Blood Culture ID Panel (Reflexed) (Collected: 12/07/2020  8:56 PM)  Result Value Ref Range   Enterococcus faecalis NOT DETECTED NOT DETECTED   Enterococcus Faecium NOT DETECTED NOT DETECTED   Listeria monocytogenes NOT DETECTED NOT DETECTED   Staphylococcus species NOT DETECTED NOT DETECTED   Staphylococcus aureus (BCID) NOT DETECTED NOT DETECTED   Staphylococcus epidermidis NOT DETECTED NOT DETECTED   Staphylococcus lugdunensis NOT DETECTED NOT DETECTED   Streptococcus species NOT DETECTED NOT DETECTED   Streptococcus agalactiae NOT DETECTED NOT DETECTED   Streptococcus pneumoniae NOT DETECTED NOT DETECTED   Streptococcus pyogenes NOT DETECTED NOT DETECTED   A.calcoaceticus-baumannii NOT DETECTED NOT DETECTED   Bacteroides fragilis NOT DETECTED NOT DETECTED   Enterobacterales PENDING NOT DETECTED   Enterobacter cloacae complex NOT DETECTED NOT DETECTED   Escherichia coli DETECTED (A) NOT DETECTED   Klebsiella aerogenes NOT DETECTED NOT DETECTED   Klebsiella oxytoca NOT  DETECTED NOT DETECTED   Klebsiella pneumoniae NOT DETECTED NOT DETECTED   Proteus species NOT DETECTED NOT DETECTED   Salmonella species NOT DETECTED NOT DETECTED   Serratia marcescens NOT DETECTED NOT DETECTED   Haemophilus influenzae NOT DETECTED NOT DETECTED   Neisseria meningitidis NOT DETECTED NOT DETECTED   Pseudomonas aeruginosa NOT DETECTED NOT DETECTED   Stenotrophomonas maltophilia NOT DETECTED NOT DETECTED   Candida albicans NOT DETECTED NOT DETECTED   Candida auris NOT DETECTED NOT DETECTED   Candida glabrata NOT DETECTED NOT DETECTED   Candida krusei NOT DETECTED NOT DETECTED   Candida parapsilosis NOT DETECTED NOT DETECTED   Candida tropicalis NOT DETECTED NOT DETECTED   Cryptococcus neoformans/gattii NOT DETECTED NOT DETECTED   CTX-M ESBL NOT DETECTED NOT DETECTED   Carbapenem resistance IMP NOT DETECTED NOT DETECTED   Carbapenem resistance KPC NOT DETECTED NOT DETECTED   Carbapenem resistance NDM NOT DETECTED NOT DETECTED   Carbapenem resist OXA 48 LIKE NOT DETECTED NOT DETECTED   Carbapenem resistance VIM NOT DETECTED NOT DETECTED    Efraim Kaufmann, PharmD, BCPS 12/08/2020  3:11 PM

## 2020-12-08 NOTE — Progress Notes (Addendum)
Received patient from the ED at 0141. Patient is [id number]. On RA, breathing is even and unlabored. Denies pain or discomfort at this time. Oriented to room, call bell placed within reach.

## 2020-12-09 LAB — BASIC METABOLIC PANEL
Anion gap: 9 (ref 5–15)
BUN: 29 mg/dL — ABNORMAL HIGH (ref 8–23)
CO2: 24 mmol/L (ref 22–32)
Calcium: 8.5 mg/dL — ABNORMAL LOW (ref 8.9–10.3)
Chloride: 101 mmol/L (ref 98–111)
Creatinine, Ser: 2.33 mg/dL — ABNORMAL HIGH (ref 0.44–1.00)
GFR, Estimated: 23 mL/min — ABNORMAL LOW (ref >60)
Glucose, Bld: 108 mg/dL — ABNORMAL HIGH (ref 70–99)
Potassium: 3.9 mmol/L (ref 3.5–5.1)
Sodium: 134 mmol/L — ABNORMAL LOW (ref 135–145)

## 2020-12-09 LAB — CBC WITH DIFFERENTIAL/PLATELET
Abs Immature Granulocytes: 0.04 10*3/uL (ref 0.00–0.07)
Basophils Absolute: 0 10*3/uL (ref 0.0–0.1)
Basophils Relative: 0 %
Eosinophils Absolute: 0.3 10*3/uL (ref 0.0–0.5)
Eosinophils Relative: 3 %
HCT: 29 % — ABNORMAL LOW (ref 36.0–46.0)
Hemoglobin: 9.4 g/dL — ABNORMAL LOW (ref 12.0–15.0)
Immature Granulocytes: 0 %
Lymphocytes Relative: 14 %
Lymphs Abs: 1.3 10*3/uL (ref 0.7–4.0)
MCH: 33.3 pg (ref 26.0–34.0)
MCHC: 32.4 g/dL (ref 30.0–36.0)
MCV: 102.8 fL — ABNORMAL HIGH (ref 80.0–100.0)
Monocytes Absolute: 0.8 10*3/uL (ref 0.1–1.0)
Monocytes Relative: 8 %
Neutro Abs: 7 10*3/uL (ref 1.7–7.7)
Neutrophils Relative %: 75 %
Platelets: 279 10*3/uL (ref 150–400)
RBC: 2.82 MIL/uL — ABNORMAL LOW (ref 3.87–5.11)
RDW: 13.9 % (ref 11.5–15.5)
WBC: 9.5 10*3/uL (ref 4.0–10.5)
nRBC: 0 % (ref 0.0–0.2)

## 2020-12-09 LAB — MAGNESIUM: Magnesium: 1.7 mg/dL (ref 1.7–2.4)

## 2020-12-09 MED ORDER — GABAPENTIN 100 MG PO CAPS
100.0000 mg | ORAL_CAPSULE | Freq: Three times a day (TID) | ORAL | Status: DC
  Administered 2020-12-09 – 2020-12-10 (×2): 100 mg via ORAL
  Filled 2020-12-09 (×2): qty 1

## 2020-12-09 MED ORDER — METOPROLOL SUCCINATE ER 50 MG PO TB24
50.0000 mg | ORAL_TABLET | Freq: Every day | ORAL | Status: DC
  Administered 2020-12-09: 50 mg via ORAL
  Filled 2020-12-09: qty 1

## 2020-12-09 MED ORDER — METOPROLOL SUCCINATE ER 100 MG PO TB24
100.0000 mg | ORAL_TABLET | Freq: Every day | ORAL | Status: DC
  Administered 2020-12-10: 100 mg via ORAL
  Filled 2020-12-09: qty 1

## 2020-12-09 MED ORDER — LOPERAMIDE HCL 2 MG PO CAPS
2.0000 mg | ORAL_CAPSULE | ORAL | Status: DC | PRN
  Administered 2020-12-09: 2 mg via ORAL
  Filled 2020-12-09: qty 1

## 2020-12-09 MED ORDER — METOPROLOL SUCCINATE ER 50 MG PO TB24: 50.0000 mg | ORAL_TABLET | ORAL | Status: DC

## 2020-12-09 NOTE — Progress Notes (Signed)
   12/08/20 0614  Assess: MEWS Score  Temp (!) 100.9 F (38.3 C)  BP (!) 142/71  Pulse Rate (!) 113  Resp 19  Level of Consciousness Alert  SpO2 97 %  O2 Device Room Air  Assess: MEWS Score  MEWS Temp 1  MEWS Systolic 0  MEWS Pulse 2  MEWS RR 0  MEWS LOC 0  MEWS Score 3  MEWS Score Color Yellow  Assess: if the MEWS score is Yellow or Red  Were vital signs taken at a resting state? Yes  Focused Assessment Change from prior assessment (see assessment flowsheet)  Early Detection of Sepsis Score *See Row Information* High  MEWS guidelines implemented *See Row Information* Yes  Treat  MEWS Interventions Administered prn meds/treatments  Pain Scale 0-10  Pain Score 0  Take Vital Signs  Increase Vital Sign Frequency  Yellow: Q 2hr X 2 then Q 4hr X 2, if remains yellow, continue Q 4hrs  Escalate  MEWS: Escalate Yellow: discuss with charge nurse/RN and consider discussing with provider and RRT  Notify: Charge Nurse/RN  Name of Charge Nurse/RN Notified Hui, RN  Date Charge Nurse/RN Notified 12/08/20  Time Charge Nurse/RN Notified 0620  Document  Progress note created (see row info) Yes

## 2020-12-09 NOTE — Progress Notes (Signed)
PROGRESS NOTE  [name]  XNA:[id number] DOB: [birth year] DOA: 12/07/2020 PCP: Ann Held, DO  Brief Narrative: [name] is a 65 y.o. female with a history of IBD s/p colectomy and ileoproctostomy with J pouch revision and chronic pouchitis, stage IV CKD, recurrent C. difficile colitis, HTN, and covid-19 in January 2022 who presented to the ED 4/3 with fatigue, nausea and vomiting with increasing urinary frequency (acute on chronic) associated with chills, left abdominal/flank pain. She had low grade fever 100.8F, tachycardic with WBC 17k, SCr 2.66 (prior 2.14), lactic acid 1.9 with nitrite-positive pyuria and bacteriuria by micro. CT A/P demonstrated left perinephric stranding with fullness in the left renal collecting system without obstructive stone or drainable fluid collection. No bowel obstruction or other acute findings. Urine and blood cultures drawn, broad antibiotics given initially, narrowed to ceftriaxone, and the patient was admitted. Blood cultures have grown E. coli.   Assessment & Plan: Principal Problem:   Severe sepsis ([place]) Active Problems:   Essential hypertension   IBD (inflammatory bowel disease)   Acute renal failure superimposed on stage 4 chronic kidney disease (HCC)   Acute pyelonephritis  Severe sepsis due to E. coli bacteremia due to left pyelonephritis:   - Continue ceftriaxone 2g, susceptibilities to follow.  AKI on stage IV CKD: Mild, likely due to sepsis.  - If tolerates advancing diet, will DC IVF  Crohn's disease s/p colectomy, ileoproctostomy and J-pouch revision 1998 with chronic proctitis: BMs are loose but no change from baseline. Followed by [facility] and Dr. Havery Moros as outpatient considering ileostomy. CT without acute changes.  - Supportive care - Entyvio as outpatient  Hypomagnesemia: Resolved.  HTN:  - Restart metoprolol at home dose.  Obesity: Estimated body mass index is 34.41 kg/m as calculated from the following:    Height as of this encounter: 5' 3"  (1.6 m).   Weight as of this encounter: 88.1 kg.  History of covid-19 infection: Negative PCR at admission, no isolation required.   History of C. diff colitis: Symptoms are at baseline not indicative of recurrence at this time.   DVT prophylaxis: Heparin Code Status: Full Family Communication: None at bedside Disposition Plan:  Status is: Inpatient.   The patient will require care spanning > 2 midnights and should be moved to inpatient because: Persistent severe electrolyte disturbances and IV treatments appropriate due to intensity of illness or inability to take PO  Dispo: The patient is from: Home              Anticipated d/c is to: Home              Patient currently is not medically stable to d/c.   Difficult to place patient No  Consultants:   None  Procedures:   None  Antimicrobials:  Vancomycin, cefepime, flagyl 4/3  Ceftriaxone 4/3 >>    Subjective: Wants to advance diet. LLQ pain is improving but remains sore. No urinary complaints. Stool characteristics are at her baseline without bleeding. No fever. Feels generally much better.   Objective: Vitals:   12/08/20 1821 12/08/20 1954 12/09/20 0412 12/09/20 1315  BP: (!) 151/82 129/87 (!) 158/88 (!) 148/87  Pulse: 100 99 99 84  Resp: 18 18 18 16   Temp: 99.9 F (37.7 C) 99.3 F (37.4 C) 99.7 F (37.6 C) 98.3 F (36.8 C)  TempSrc: Axillary Oral Oral Oral  SpO2: 100% 100% 94% 98%  Weight:      Height:  Intake/Output Summary (Last 24 hours) at 12/09/2020 1449 Last data filed at 12/08/2020 1953 Gross per 24 hour  Intake 759.72 ml  Output 525 ml  Net 234.72 ml   Filed Weights   12/07/20 2045 12/08/20 0224  Weight: 90.7 kg 88.1 kg   Gen: 65 y.o. female in no distress Pulm: Nonlabored breathing room air. Clear. CV: Regular rate and rhythm. No murmur, rub, or gallop. No JVD, no dependent edema. GI: Abdomen soft, tender on left without rebound or guarding,  non-distended, with normoactive bowel sounds. Ostomy c/d/i. Ext: Warm, no deformities Skin: No new rashes, lesions or ulcers on visualized skin. Neuro: Alert and oriented. No focal neurological deficits. Psych: Judgement and insight appear fair. Mood euthymic & affect congruent. Behavior is appropriate.    Data Reviewed: I have personally reviewed following labs and imaging studies  CBC: Recent Labs  Lab 12/07/20 2056 12/08/20 0305 12/09/20 0408  WBC 17.0* 13.5* 9.5  NEUTROABS 14.6*  --  7.0  HGB 11.2* 9.2* 9.4*  HCT 35.4* 29.0* 29.0*  MCV 103.5* 103.9* 102.8*  PLT 370 295 903   Basic Metabolic Panel: Recent Labs  Lab 12/07/20 2056 12/08/20 0305 12/09/20 0408  NA 132* 134* 134*  K 4.3 3.9 3.9  CL 101 103 101  CO2 20* 23 24  GLUCOSE 104* 126* 108*  BUN 35* 35* 29*  CREATININE 2.66* 2.58* 2.33*  CALCIUM 9.1 8.6* 8.5*  MG  --  1.2* 1.7   GFR: Estimated Creatinine Clearance: 25 mL/min (A) (by C-G formula based on SCr of 2.33 mg/dL (H)). Liver Function Tests: Recent Labs  Lab 12/07/20 2056  AST 23  ALT 17  ALKPHOS 94  BILITOT 1.3*  PROT 8.1  ALBUMIN 3.8   No results for input(s): LIPASE, AMYLASE in the last 168 hours. No results for input(s): AMMONIA in the last 168 hours. Coagulation Profile: Recent Labs  Lab 12/07/20 2056  INR 1.0   Cardiac Enzymes: No results for input(s): CKTOTAL, CKMB, CKMBINDEX, TROPONINI in the last 168 hours. BNP (last 3 results) No results for input(s): PROBNP in the last 8760 hours. HbA1C: No results for input(s): HGBA1C in the last 72 hours. CBG: No results for input(s): GLUCAP in the last 168 hours. Lipid Profile: No results for input(s): CHOL, HDL, LDLCALC, TRIG, CHOLHDL, LDLDIRECT in the last 72 hours. Thyroid Function Tests: No results for input(s): TSH, T4TOTAL, FREET4, T3FREE, THYROIDAB in the last 72 hours. Anemia Panel: No results for input(s): VITAMINB12, FOLATE, FERRITIN, TIBC, IRON, RETICCTPCT in the last 72  hours. Urine analysis:    Component Value Date/Time   COLORURINE YELLOW 12/07/2020 2141   APPEARANCEUR HAZY (A) 12/07/2020 2141   LABSPEC 1.020 12/07/2020 2141   PHURINE 6.0 12/07/2020 2141   GLUCOSEU NEGATIVE 12/07/2020 2141   GLUCOSEU NEGATIVE 03/14/2009 1603   HGBUR MODERATE (A) 12/07/2020 2141   BILIRUBINUR NEGATIVE 12/07/2020 2141   BILIRUBINUR Negative 01/06/2018 1306   KETONESUR NEGATIVE 12/07/2020 2141   PROTEINUR 100 (A) 12/07/2020 2141   UROBILINOGEN 1.0 01/06/2018 1306   UROBILINOGEN 0.2 04/12/2013 0900   NITRITE POSITIVE (A) 12/07/2020 2141   LEUKOCYTESUR SMALL (A) 12/07/2020 2141   Recent Results (from the past 240 hour(s))  Culture, blood (Routine X 2) w Reflex to ID Panel     Status: Abnormal (Preliminary result)   Collection Time: 12/07/20  8:56 PM   Specimen: BLOOD  Result Value Ref Range Status   Specimen Description   Final    BLOOD LEFT ANTECUBITAL Performed at [facility]  [place] [address] [place], [place] [address]    Special Requests   Final    BOTTLES DRAWN AEROBIC AND ANAEROBIC Blood Culture results may not be optimal due to an excessive volume of blood received in culture bottles Performed at [facility] [address], [place], [place] [address]    Culture  Setup Time   Final    GRAM NEGATIVE RODS IN BOTH AEROBIC AND ANAEROBIC BOTTLES CRITICAL RESULT CALLED TO, READ BACK BY AND VERIFIED WITH: PHARMD M MICHAEL [id number] AT 1441 BY CM    Culture (A)  Final    ESCHERICHIA COLI SUSCEPTIBILITIES TO FOLLOW Performed at [facility], [place] [address], [place], [place] [address]    Report Status PENDING  Incomplete  Blood Culture ID Panel (Reflexed)     Status: Abnormal (Preliminary result)   Collection Time: 12/07/20  8:56 PM  Result Value Ref Range Status   Enterococcus faecalis NOT DETECTED NOT DETECTED Final   Enterococcus Faecium NOT DETECTED NOT DETECTED Final   Listeria monocytogenes NOT DETECTED NOT  DETECTED Final   Staphylococcus species NOT DETECTED NOT DETECTED Final   Staphylococcus aureus (BCID) NOT DETECTED NOT DETECTED Final   Staphylococcus epidermidis NOT DETECTED NOT DETECTED Final   Staphylococcus lugdunensis NOT DETECTED NOT DETECTED Final   Streptococcus species NOT DETECTED NOT DETECTED Final   Streptococcus agalactiae NOT DETECTED NOT DETECTED Final   Streptococcus pneumoniae NOT DETECTED NOT DETECTED Final   Streptococcus pyogenes NOT DETECTED NOT DETECTED Final   A.calcoaceticus-baumannii NOT DETECTED NOT DETECTED Final   Bacteroides fragilis NOT DETECTED NOT DETECTED Final   Enterobacterales PENDING NOT DETECTED Incomplete   Enterobacter cloacae complex NOT DETECTED NOT DETECTED Final   Escherichia coli DETECTED (A) NOT DETECTED Final    Comment: CRITICAL RESULT CALLED TO, READ BACK BY AND VERIFIED WITH: PHARMD M MICHAEL [id number] AT 1441 BY CM    Klebsiella aerogenes NOT DETECTED NOT DETECTED Final   Klebsiella oxytoca NOT DETECTED NOT DETECTED Final   Klebsiella pneumoniae NOT DETECTED NOT DETECTED Final   Proteus species NOT DETECTED NOT DETECTED Final   Salmonella species NOT DETECTED NOT DETECTED Final   Serratia marcescens NOT DETECTED NOT DETECTED Final   Haemophilus influenzae NOT DETECTED NOT DETECTED Final   Neisseria meningitidis NOT DETECTED NOT DETECTED Final   Pseudomonas aeruginosa NOT DETECTED NOT DETECTED Final   Stenotrophomonas maltophilia NOT DETECTED NOT DETECTED Final   Candida albicans NOT DETECTED NOT DETECTED Final   Candida auris NOT DETECTED NOT DETECTED Final   Candida glabrata NOT DETECTED NOT DETECTED Final   Candida krusei NOT DETECTED NOT DETECTED Final   Candida parapsilosis NOT DETECTED NOT DETECTED Final   Candida tropicalis NOT DETECTED NOT DETECTED Final   Cryptococcus neoformans/gattii NOT DETECTED NOT DETECTED Final   CTX-M ESBL NOT DETECTED NOT DETECTED Final   Carbapenem resistance IMP NOT DETECTED NOT DETECTED Final    Carbapenem resistance KPC NOT DETECTED NOT DETECTED Final   Carbapenem resistance NDM NOT DETECTED NOT DETECTED Final   Carbapenem resist OXA 48 LIKE NOT DETECTED NOT DETECTED Final   Carbapenem resistance VIM NOT DETECTED NOT DETECTED Final    Comment: Performed at [facility], [address] [place], [place] [address]  Blood culture (routine single)     Status: None (Preliminary result)   Collection Time: 12/07/20  9:00 PM   Specimen: BLOOD  Result Value Ref Range Status   Specimen Description   Final  BLOOD RIGHT ANTECUBITAL Performed at [facility] [address] [place], [place] [address]    Special Requests   Final    BOTTLES DRAWN AEROBIC AND ANAEROBIC Blood Culture results may not be optimal due to an excessive volume of blood received in culture bottles Performed at [facility] [address], [place], [place] [address]    Culture  Setup Time   Final    GRAM NEGATIVE RODS ANAEROBIC BOTTLE ONLY CRITICAL VALUE NOTED.  VALUE IS CONSISTENT WITH PREVIOUSLY REPORTED AND CALLED VALUE.    Culture   Final    GRAM NEGATIVE RODS IDENTIFICATION TO FOLLOW Performed at [facility], [place] [address] [place], [place] [address]    Report Status PENDING  Incomplete  Urine culture     Status: Abnormal (Preliminary result)   Collection Time: 12/07/20  9:41 PM   Specimen: In/Out Cath Urine  Result Value Ref Range Status   Specimen Description   Final    IN/OUT CATH URINE Performed at [facility] [address], [place], [place] [address]    Special Requests   Final    NONE Performed at [facility], [place] [address], [place], [place] [address]    Culture (A)  Final    >=100,000 COLONIES/mL ESCHERICHIA COLI SUSCEPTIBILITIES TO FOLLOW Performed at [facility], [place] [address] [place],  [address]    Report Status PENDING  Incomplete  Resp Panel by RT-PCR (Flu A&B, Covid)  Nasopharyngeal Swab     Status: None   Collection Time: 12/07/20  9:46 PM   Specimen: Nasopharyngeal Swab; Nasopharyngeal(NP) swabs in vial transport medium  Result Value Ref Range Status   SARS Coronavirus 2 by RT PCR NEGATIVE NEGATIVE Final    Comment: (NOTE) SARS-CoV-2 target nucleic acids are NOT DETECTED.  The SARS-CoV-2 RNA is generally detectable in upper respiratory specimens during the acute phase of infection. The lowest concentration of SARS-CoV-2 viral copies this assay can detect is 138 copies/mL. A negative result does not preclude SARS-Cov-2 infection and should not be used as the sole basis for treatment or other patient management decisions. A negative result may occur with  improper specimen collection/handling, submission of specimen other than nasopharyngeal swab, presence of viral mutation(s) within the areas targeted by this assay, and inadequate number of viral copies(<138 copies/mL). A negative result must be combined with clinical observations, patient history, and epidemiological information. The expected result is Negative.  Fact Sheet for Patients:  EntrepreneurPulse.com.au  Fact Sheet for Healthcare Providers:  IncredibleEmployment.be  This test is no t yet approved or cleared by the [place] FDA and  has been authorized for detection and/or diagnosis of SARS-CoV-2 by FDA under an Emergency Use Authorization (EUA). This EUA will remain  in effect (meaning this test can be used) for the duration of the COVID-19 declaration under Section 564(b)(1) of the Act, 21 U.S.C.section 360bbb-3(b)(1), unless the authorization is terminated  or revoked sooner.       Influenza A by PCR NEGATIVE NEGATIVE Final   Influenza B by PCR NEGATIVE NEGATIVE Final    Comment: (NOTE) The Xpert Xpress SARS-CoV-2/FLU/RSV plus assay is intended as an aid in the diagnosis of influenza from Nasopharyngeal swab specimens and should not be  used as a sole basis for treatment. Nasal washings and aspirates are unacceptable for Xpert Xpress SARS-CoV-2/FLU/RSV testing.  Fact Sheet for Patients: EntrepreneurPulse.com.au  Fact Sheet for Healthcare Providers: IncredibleEmployment.be  This test is  not yet approved or cleared by the [place] and has been authorized for detection and/or diagnosis of SARS-CoV-2 by FDA under an Emergency Use Authorization (EUA). This EUA will remain in effect (meaning this test can be used) for the duration of the COVID-19 declaration under Section 564(b)(1) of the Act, 21 U.S.C. section 360bbb-3(b)(1), unless the authorization is terminated or revoked.  Performed at [facility], [place] [address], [place], [place] [address]       Radiology Studies: CT ABDOMEN PELVIS WO CONTRAST  Result Date: 12/07/2020 CLINICAL DATA:  65 year old female with abdominal pain and fever. EXAM: CT ABDOMEN AND PELVIS WITHOUT CONTRAST TECHNIQUE: Multidetector CT imaging of the abdomen and pelvis was performed following the standard protocol without IV contrast. COMPARISON:  CT abdomen pelvis dated 04/28/2011. FINDINGS: Evaluation of this exam is limited in the absence of intravenous contrast. Lower chest: The visualized lung bases are clear. No intra-abdominal free air or free fluid. Hepatobiliary: The liver is unremarkable. Cholecystectomy. No retained calcified stone noted in the central CBD. Pancreas: Unremarkable. No pancreatic ductal dilatation or surrounding inflammatory changes. Spleen: Normal in size without focal abnormality. Adrenals/Urinary Tract: The adrenal glands unremarkable. There is a punctate nonobstructing left renal interpolar calculus versus vascular calcification. There is minimal fullness of the left renal collecting system. No obstructing stone identified. There is left perinephric stranding. Findings may be related to a recently passed left  renal calculus versus pyelonephritis. Correlation with urinalysis recommended. No drainable fluid collection or abscess. The right kidney, right ureter, and urinary bladder appear unremarkable. Stomach/Bowel: Postsurgical changes of the proctocolectomy with anastomotic sutures in the pelvis. There is no bowel obstruction or active inflammation. Vascular/Lymphatic: Mild atherosclerotic calcification of the abdominal aorta. The IVC is unremarkable. No portal venous gas. There is no adenopathy. Reproductive: Hysterectomy. There is a 2 cm right adnexal cyst. No follow-up imaging recommended. Note: This recommendation does not apply to premenarchal patients and to those with increased risk (genetic, family history, elevated tumor markers or other high-risk factors) of ovarian cancer. Reference: JACR 2020 Feb; 17(2):248-254 Other: Midline vertical anterior abdominal wall incisional scar. Musculoskeletal: Degenerative changes of the spine. No acute osseous pathology. IMPRESSION: 1. Minimal fullness of the left renal collecting system with left perinephric stranding. No obstructing stone identified. Findings may be related to a recently passed left renal calculus versus pyelonephritis. Correlation with urinalysis recommended. No drainable fluid collection or abscess. 2. Postsurgical changes of the proctocolectomy. No bowel obstruction. 3. Aortic Atherosclerosis (ICD10-I70.0). Electronically Signed   By: Anner Crete M.D.   On: 12/07/2020 23:06   DG Chest Port 1 View  Result Date: 12/07/2020 CLINICAL DATA:  Questionable sepsis EXAM: PORTABLE CHEST 1 VIEW COMPARISON:  09/21/2020 FINDINGS: The heart size and mediastinal contours are within normal limits. Both lungs are clear. The visualized skeletal structures are unremarkable. IMPRESSION: Normal study. Electronically Signed   By: Rolm Baptise M.D.   On: 12/07/2020 20:52    Scheduled Meds: . DULoxetine  120 mg Oral Daily  . famotidine  20 mg Oral Daily  .  gabapentin  100 mg Oral BID  . heparin  5,000 Units Subcutaneous Q8H  . metoprolol succinate  100 mg Oral Daily   Continuous Infusions: . cefTRIAXone (ROCEPHIN)  IV 2 g (12/09/20 0935)     LOS: 1 day   Time spent: 25 minutes.  Patrecia Pour, MD [facility] [email or web address] 12/09/2020, 2:49 PM

## 2020-12-10 DIAGNOSIS — N179 Acute kidney failure, unspecified: Secondary | ICD-10-CM

## 2020-12-10 DIAGNOSIS — N10 Acute tubulo-interstitial nephritis: Secondary | ICD-10-CM

## 2020-12-10 DIAGNOSIS — R6520 Severe sepsis without septic shock: Secondary | ICD-10-CM

## 2020-12-10 DIAGNOSIS — N184 Chronic kidney disease, stage 4 (severe): Secondary | ICD-10-CM

## 2020-12-10 DIAGNOSIS — I10 Essential (primary) hypertension: Secondary | ICD-10-CM

## 2020-12-10 DIAGNOSIS — K529 Noninfective gastroenteritis and colitis, unspecified: Secondary | ICD-10-CM

## 2020-12-10 DIAGNOSIS — A419 Sepsis, unspecified organism: Secondary | ICD-10-CM

## 2020-12-10 LAB — CBC WITH DIFFERENTIAL/PLATELET
Abs Immature Granulocytes: 0.03 10*3/uL (ref 0.00–0.07)
Basophils Absolute: 0 10*3/uL (ref 0.0–0.1)
Basophils Relative: 0 %
Eosinophils Absolute: 0.7 10*3/uL — ABNORMAL HIGH (ref 0.0–0.5)
Eosinophils Relative: 9 %
HCT: 30.5 % — ABNORMAL LOW (ref 36.0–46.0)
Hemoglobin: 9.8 g/dL — ABNORMAL LOW (ref 12.0–15.0)
Immature Granulocytes: 0 %
Lymphocytes Relative: 20 %
Lymphs Abs: 1.5 10*3/uL (ref 0.7–4.0)
MCH: 33.4 pg (ref 26.0–34.0)
MCHC: 32.1 g/dL (ref 30.0–36.0)
MCV: 104.1 fL — ABNORMAL HIGH (ref 80.0–100.0)
Monocytes Absolute: 0.6 10*3/uL (ref 0.1–1.0)
Monocytes Relative: 8 %
Neutro Abs: 4.7 10*3/uL (ref 1.7–7.7)
Neutrophils Relative %: 63 %
Platelets: 310 10*3/uL (ref 150–400)
RBC: 2.93 MIL/uL — ABNORMAL LOW (ref 3.87–5.11)
RDW: 13.7 % (ref 11.5–15.5)
WBC: 7.5 10*3/uL (ref 4.0–10.5)
nRBC: 0 % (ref 0.0–0.2)

## 2020-12-10 LAB — COMPREHENSIVE METABOLIC PANEL
ALT: 15 U/L (ref 0–44)
AST: 16 U/L (ref 15–41)
Albumin: 2.9 g/dL — ABNORMAL LOW (ref 3.5–5.0)
Alkaline Phosphatase: 83 U/L (ref 38–126)
Anion gap: 9 (ref 5–15)
BUN: 25 mg/dL — ABNORMAL HIGH (ref 8–23)
CO2: 27 mmol/L (ref 22–32)
Calcium: 9 mg/dL (ref 8.9–10.3)
Chloride: 99 mmol/L (ref 98–111)
Creatinine, Ser: 2.13 mg/dL — ABNORMAL HIGH (ref 0.44–1.00)
GFR, Estimated: 25 mL/min — ABNORMAL LOW (ref >60)
Glucose, Bld: 174 mg/dL — ABNORMAL HIGH (ref 70–99)
Potassium: 4.3 mmol/L (ref 3.5–5.1)
Sodium: 135 mmol/L (ref 135–145)
Total Bilirubin: 0.3 mg/dL (ref 0.3–1.2)
Total Protein: 7.1 g/dL (ref 6.5–8.1)

## 2020-12-10 LAB — CULTURE, BLOOD (ROUTINE X 2)

## 2020-12-10 LAB — URINE CULTURE: Culture: >=100000 — AB

## 2020-12-10 LAB — PHOSPHORUS: Phosphorus: 2.5 mg/dL (ref 2.5–4.6)

## 2020-12-10 LAB — CULTURE, BLOOD (SINGLE)

## 2020-12-10 LAB — MAGNESIUM: Magnesium: 1.7 mg/dL (ref 1.7–2.4)

## 2020-12-10 MED ORDER — MAGNESIUM SULFATE IN D5W 1-5 GM/100ML-% IV SOLN
1.0000 g | Freq: Once | INTRAVENOUS | Status: AC
  Administered 2020-12-10: 1 g via INTRAVENOUS
  Filled 2020-12-10: qty 100

## 2020-12-10 MED ORDER — ONDANSETRON HCL 4 MG PO TABS: 4.0000 mg | ORAL_TABLET | Freq: Four times a day (QID) | ORAL | 0 refills | Status: DC | PRN

## 2020-12-10 MED ORDER — FAMOTIDINE 20 MG PO TABS: 20.0000 mg | ORAL_TABLET | Freq: Every day | ORAL | 0 refills | Status: DC

## 2020-12-10 MED ORDER — CEPHALEXIN 500 MG PO CAPS
500.0000 mg | ORAL_CAPSULE | Freq: Three times a day (TID) | ORAL | Status: DC
  Administered 2020-12-10: 500 mg via ORAL
  Filled 2020-12-10: qty 1

## 2020-12-10 MED ORDER — CEPHALEXIN 500 MG PO CAPS: 500.0000 mg | ORAL_CAPSULE | Freq: Three times a day (TID) | ORAL | 0 refills | Status: AC

## 2020-12-10 NOTE — Progress Notes (Signed)
ASP/ID autochamp chart check  Gram negative bacteremia    Reviewed chart Sepsis with pyelo and pan-sensitive ecoli bacteremia Improving on appropriate abx Ct without stone/abscess intraabd-renal   -agree can transition to PO abx such as cephalexin and finish total 10 days abx including ceftriaxone duration -ok to dc from id standpoint if continued improvement/stability -discssed with Dr Sheikh/our ID Pharm team

## 2020-12-10 NOTE — Discharge Summary (Signed)
Physician Discharge Summary  [name] IOE:[id number] DOB: 28-Jun-1956 DOA: 12/07/2020  PCP: Ann Held, DO  Admit date: 12/07/2020 Discharge date: 12/10/2020  Admitted From: Home Disposition: Home  Recommendations for Outpatient Follow-up:  1. Follow up with PCP in 1-2 weeks 2. Follow up with ID as needed  3. Please obtain CMP/CBC, Mag, Phos in one week 4. 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 difficile colitis, hypertension, COVID-19 disease in July 2022 as well as other comorbidities who presented to the ED on 12/07/2020 with a chief complaint of fatigue, nausea, vomiting and increased urinary frequency associated with chills, left abdominal flank pain.  She was noted to have a low-grade fever and she was tachycardic with elevated WBC and serum creatinine of 2.66.  Lactic acid was elevated and her urine was nitrite positive and had pyuria and bacteriuria.  CT of the abdomen pelvis was done which demonstrated left perinephric stranding and fullness of the left renal collecting system without obstructive stone or drainable fluid collection.  No bowel obstruction or any acute on the findings noted.  Urine and blood cultures were drawn and she is initiated on broad-spectrum antibiotics narrowed to ceftriaxone and the patient was admitted.  Blood cultures and urine culture grew out E. coli which was pansensitive.  She was transitioned off IV antibiotics to p.o. cephalexin per ID recommendations and was deemed stable to be discharged home.  She is improved and her urinary  frequency has improved as well.  She denies any complaints and is stable to be discharged and follow-up with PCP within 1 to 2 weeks  Discharge Diagnoses:  Principal Problem:   Severe sepsis ([place]) Active Problems:   Essential hypertension   IBD (inflammatory bowel disease)   Acute renal failure superimposed on stage 4 chronic kidney disease ([place])   Acute pyelonephritis  Severe sepsis due to E. coli bacteremia due to left pyelonephritis:   -Continue ceftriaxone 2g while hospitalized and sensitivities were pansensitive -Discussed with ID and the plan is to change her to p.o. cephalexin for total of 10 days -Sepsis physiology is much improved her WBC is resolved next-patient's procalcitonin level was 16.32 the day before yesterday  -Follow-up with ID as an outpatient if needed but she is eating and drinking well and remains stable for discharge  AKI on stage IV CKD: Mild, likely due to sepsis.  -Diet advanced with no issues -Patient's BUNs/creatinine went from 35/2.66 and trended down to 25/2.13 -Avoid nephrotoxic medications, contrast dyes, hypotension and renally dose medications -Repeat CMP in the outpatient setting  Crohn's disease s/p colectomy, ileoproctostomy and J-pouch revision 1998 with chronic proctitis: -BMs are loose but no change from baseline. Followed by [facility] and Dr. Havery Moros as outpatient considering ileostomy. CT without acute changes.  -C/w Supportive care -Resume Entyvio as outpatient after bacteremia clears  Macrocytic Anemia/Anemia of chronic kidney disease -Patient BUN/creatinine is relatively stable at 9.8/30.5 with an MCV of 104.1 -Check Anemia Panel in the outpatient setting -Continue to Monitor and Trend in the outpatient setting   Hypomagnesemia -Resolved.  HTN -Restart metoprolol at home dose. -Continue to monitor blood pressures per  protocol -Last blood pressure at the time of discharge was 143/94  Hyponatremia  -Patient's Na+ is improved and  is now 135 -Continue to Monitor and Trend -Repeat CMP as an outpatient   Obesity -Complicates overall prognosis and care -Estimated body mass index is 34.41 kg/m as calculated from the following:   Height as of this encounter: 5' 3"  (1.6 m).   Weight as of this encounter: 88.1 kg. -Weight loss and Dietary Counseling given  History of covid-19 infection -Negative PCR at admission, no isolation required.   History of C. diff colitis -Symptoms are at baseline not indicative of recurrence at this time.   Discharge Instructions  Discharge Instructions    Call MD for:  difficulty breathing, headache or visual disturbances   Complete by: As directed    Call MD for:  extreme fatigue   Complete by: As directed    Call MD for:  hives   Complete by: As directed    Call MD for:  persistant dizziness or light-headedness   Complete by: As directed    Call MD for:  persistant nausea and vomiting   Complete by: As directed    Call MD for:  redness, tenderness, or signs of infection (pain, swelling, redness, odor or green/yellow discharge around incision site)   Complete by: As directed    Call MD for:  severe uncontrolled pain   Complete by: As directed    Call MD for:  temperature >100.4   Complete by: As directed    Diet - low sodium heart healthy   Complete by: As directed    Discharge instructions   Complete by: As directed    You were cared for by a hospitalist during your hospital stay. If you have any questions about your discharge medications or the care you received while you were in the hospital after you are discharged, you can call the unit and ask to speak with the hospitalist on call if the hospitalist that took care of you is not available. Once you are discharged, your primary care physician will handle any further medical issues. Please note that NO REFILLS for any discharge medications will be authorized once you are discharged, as it is imperative that you return to  your primary care physician (or establish a relationship with a primary care physician if you do not have one) for your aftercare needs so that they can reassess your need for medications and monitor your lab values.  Follow up with PCP and ID if needed. Take all medications as prescribed. If symptoms change or worsen please return to the ED for evaluation   Increase activity slowly   Complete by: As directed      Allergies as of 12/10/2020      Reactions   Sulfa Antibiotics Hives   Morphine Other (See Comments)   Gives me crazy dreams Gives me crazy dreams   Sulfonamide Derivatives Itching, Swelling, Rash      Medication List    STOP taking these medications   Citrucel oral powder Generic drug: methylcellulose     TAKE these medications   acetaminophen 325 MG tablet Commonly known as: TYLENOL Take 650 mg by mouth every 6 (six) hours as needed for mild pain, fever or headache.   allopurinol 300 MG tablet Commonly known as: ZYLOPRIM TAKE 1 TABLET BY MOUTH EVERY DAY   cephALEXin 500 MG capsule Commonly known as: KEFLEX Take 1 capsule (500 mg total) by mouth 3 (three) times daily for 7  days. Continue through 12/16/2020   cholecalciferol 25 MCG (1000 UNIT) tablet Commonly known as: VITAMIN D Take 1,000 Units by mouth daily.   DULoxetine 60 MG capsule Commonly known as: CYMBALTA Take 2 capsules (120 mg total) by mouth daily.   Entyvio 300 MG injection Generic drug: vedolizumab Inject 300 mg into the vein every 30 (thirty) days.   famotidine 20 MG tablet Commonly known as: PEPCID Take 1 tablet (20 mg total) by mouth daily. Start taking on: December 11, 2020 What changed: when to take this   folic acid 1 MG tablet Commonly known as: FOLVITE Take 2 tablets (2 mg total) by mouth daily. What changed: how much to take   gabapentin 100 MG capsule Commonly known as: NEURONTIN TAKE 1 CAPSULE BY MOUTH THREE TIMES A DAY What changed: See the new instructions.   metoprolol  succinate 100 MG 24 hr tablet Commonly known as: TOPROL-XL Take 50-100 mg by mouth See admin instructions. Take 100 mg in the a.m. and 50 mg in the evening   ondansetron 4 MG tablet Commonly known as: ZOFRAN Take 1 tablet (4 mg total) by mouth every 6 (six) hours as needed for nausea.   Vitamin D (Ergocalciferol) 1.25 MG (50000 UNIT) Caps capsule Commonly known as: DRISDOL Take 50,000 Units by mouth every 30 (thirty) days.       Follow-up Information    Ann Held, DO Follow up on 12/18/2020.   Specialty: Family Medicine Why: Thursday at 11:20 for your hospital follow up appointment Contact information: [address] [place] [address] [phone]              Allergies  Allergen Reactions  . Sulfa Antibiotics Hives  . Morphine Other (See Comments)    Gives me crazy dreams Gives me crazy dreams  . Sulfonamide Derivatives Itching, Swelling and Rash    Consultations:  Discussed with ID  Procedures/Studies: CT ABDOMEN PELVIS WO CONTRAST  Result Date: 12/07/2020 CLINICAL DATA:  66 year old female with abdominal pain and fever. EXAM: CT ABDOMEN AND PELVIS WITHOUT CONTRAST TECHNIQUE: Multidetector CT imaging of the abdomen and pelvis was performed following the standard protocol without IV contrast. COMPARISON:  CT abdomen pelvis dated 04/28/2011. FINDINGS: Evaluation of this exam is limited in the absence of intravenous contrast. Lower chest: The visualized lung bases are clear. No intra-abdominal free air or free fluid. Hepatobiliary: The liver is unremarkable. Cholecystectomy. No retained calcified stone noted in the central CBD. Pancreas: Unremarkable. No pancreatic ductal dilatation or surrounding inflammatory changes. Spleen: Normal in size without focal abnormality. Adrenals/Urinary Tract: The adrenal glands unremarkable. There is a punctate nonobstructing left renal interpolar calculus versus vascular calcification. There is minimal fullness  of the left renal collecting system. No obstructing stone identified. There is left perinephric stranding. Findings may be related to a recently passed left renal calculus versus pyelonephritis. Correlation with urinalysis recommended. No drainable fluid collection or abscess. The right kidney, right ureter, and urinary bladder appear unremarkable. Stomach/Bowel: Postsurgical changes of the proctocolectomy with anastomotic sutures in the pelvis. There is no bowel obstruction or active inflammation. Vascular/Lymphatic: Mild atherosclerotic calcification of the abdominal aorta. The IVC is unremarkable. No portal venous gas. There is no adenopathy. Reproductive: Hysterectomy. There is a 2 cm right adnexal cyst. No follow-up imaging recommended. Note: This recommendation does not apply to premenarchal patients and to those with increased risk (genetic, family history, elevated tumor markers or other high-risk factors) of ovarian cancer. Reference: [place] 2020 Feb;  17(2):248-254 Other: Midline vertical anterior abdominal wall incisional scar. Musculoskeletal: Degenerative changes of the spine. No acute osseous pathology. IMPRESSION: 1. Minimal fullness of the left renal collecting system with left perinephric stranding. No obstructing stone identified. Findings may be related to a recently passed left renal calculus versus pyelonephritis. Correlation with urinalysis recommended. No drainable fluid collection or abscess. 2. Postsurgical changes of the proctocolectomy. No bowel obstruction. 3. Aortic Atherosclerosis (ICD10-I70.0). Electronically Signed   By: Anner Crete M.D.   On: 12/07/2020 23:06   DG Chest Port 1 View  Result Date: 12/07/2020 CLINICAL DATA:  Questionable sepsis EXAM: PORTABLE CHEST 1 VIEW COMPARISON:  09/21/2020 FINDINGS: The heart size and mediastinal contours are within normal limits. Both lungs are clear. The visualized skeletal structures are unremarkable. IMPRESSION: Normal study.  Electronically Signed   By: Rolm Baptise M.D.   On: 12/07/2020 20:52    Subjective: Seen and examined at bedside and she is feeling well and improved.  States her urinary incontinence has improved as well.  Continues to have abdominal pain but is chronic for her.  No other concerns reported at this time and feels well and stable to go home.  Discharge Exam: Vitals:   12/10/20 0352 12/10/20 1324  BP: (!) 149/86 (!) 143/94  Pulse: 86 88  Resp: 18 18  Temp: 98.7 F (37.1 C) 98.6 F (37 C)  SpO2: 96% 98%   Vitals:   12/09/20 1315 12/09/20 2004 12/10/20 0352 12/10/20 1324  BP: (!) 148/87 (!) 171/95 (!) 149/86 (!) 143/94  Pulse: 84 89 86 88  Resp: 16 18 18 18   Temp: 98.3 F (36.8 C) 99.1 F (37.3 C) 98.7 F (37.1 C) 98.6 F (37 C)  TempSrc: Oral  Oral Oral  SpO2: 98% 98% 96% 98%  Weight:      Height:       General: Pt is alert, awake, not in acute distress Cardiovascular: RRR, S1/S2 +, no rubs, no gallops Respiratory: CTA bilaterally, no wheezing, no rhonchi Abdominal: Soft, Tender to palpate, Distended 2/2 to body habitus, bowel sounds + Extremities: Trace edema, no cyanosis  The results of significant diagnostics from this hospitalization (including imaging, microbiology, ancillary and laboratory) are listed below for reference.     Microbiology: Recent Results (from the past 240 hour(s))  Culture, blood (Routine X 2) w Reflex to ID Panel     Status: Abnormal   Collection Time: 12/07/20  8:56 PM   Specimen: BLOOD  Result Value Ref Range Status   Specimen Description   Final    BLOOD LEFT ANTECUBITAL Performed at [facility] [address], [place], [place] [address]    Special Requests   Final    BOTTLES DRAWN AEROBIC AND ANAEROBIC Blood Culture results may not be optimal due to an excessive volume of blood received in culture bottles Performed at [facility] [address], [place], [place] [address]    Culture  Setup Time    Final    GRAM NEGATIVE RODS IN BOTH AEROBIC AND ANAEROBIC BOTTLES CRITICAL RESULT CALLED TO, READ BACK BY AND VERIFIED WITH: Oswaldo Done [id number] AT 1441 BY CM Performed at [facility], [place] [address], [place],  [address]    Culture ESCHERICHIA COLI (A)  Final   Report Status 12/10/2020 FINAL  Final   Organism ID, Bacteria ESCHERICHIA COLI  Final      Susceptibility   Escherichia coli - MIC*    AMPICILLIN <=2 SENSITIVE Sensitive  CEFAZOLIN <=4 SENSITIVE Sensitive     CEFEPIME <=0.12 SENSITIVE Sensitive     CEFTAZIDIME <=1 SENSITIVE Sensitive     CEFTRIAXONE <=0.25 SENSITIVE Sensitive     CIPROFLOXACIN <=0.25 SENSITIVE Sensitive     GENTAMICIN <=1 SENSITIVE Sensitive     IMIPENEM <=0.25 SENSITIVE Sensitive     TRIMETH/SULFA <=20 SENSITIVE Sensitive     AMPICILLIN/SULBACTAM <=2 SENSITIVE Sensitive     PIP/TAZO <=4 SENSITIVE Sensitive     * ESCHERICHIA COLI  Blood Culture ID Panel (Reflexed)     Status: Abnormal (Preliminary result)   Collection Time: 12/07/20  8:56 PM  Result Value Ref Range Status   Enterococcus faecalis NOT DETECTED NOT DETECTED Final   Enterococcus Faecium NOT DETECTED NOT DETECTED Final   Listeria monocytogenes NOT DETECTED NOT DETECTED Final   Staphylococcus species NOT DETECTED NOT DETECTED Final   Staphylococcus aureus (BCID) NOT DETECTED NOT DETECTED Final   Staphylococcus epidermidis NOT DETECTED NOT DETECTED Final   Staphylococcus lugdunensis NOT DETECTED NOT DETECTED Final   Streptococcus species NOT DETECTED NOT DETECTED Final   Streptococcus agalactiae NOT DETECTED NOT DETECTED Final   Streptococcus pneumoniae NOT DETECTED NOT DETECTED Final   Streptococcus pyogenes NOT DETECTED NOT DETECTED Final   A.calcoaceticus-baumannii NOT DETECTED NOT DETECTED Final   Bacteroides fragilis NOT DETECTED NOT DETECTED Final   Enterobacterales PENDING NOT DETECTED Incomplete   Enterobacter cloacae complex NOT DETECTED NOT DETECTED Final    Escherichia coli DETECTED (A) NOT DETECTED Final    Comment: CRITICAL RESULT CALLED TO, READ BACK BY AND VERIFIED WITH: PHARMD M MICHAEL [id number] AT 1441 BY CM    Klebsiella aerogenes NOT DETECTED NOT DETECTED Final   Klebsiella oxytoca NOT DETECTED NOT DETECTED Final   Klebsiella pneumoniae NOT DETECTED NOT DETECTED Final   Proteus species NOT DETECTED NOT DETECTED Final   Salmonella species NOT DETECTED NOT DETECTED Final   Serratia marcescens NOT DETECTED NOT DETECTED Final   Haemophilus influenzae NOT DETECTED NOT DETECTED Final   Neisseria meningitidis NOT DETECTED NOT DETECTED Final   Pseudomonas aeruginosa NOT DETECTED NOT DETECTED Final   Stenotrophomonas maltophilia NOT DETECTED NOT DETECTED Final   Candida albicans NOT DETECTED NOT DETECTED Final   Candida auris NOT DETECTED NOT DETECTED Final   Candida glabrata NOT DETECTED NOT DETECTED Final   Candida krusei NOT DETECTED NOT DETECTED Final   Candida parapsilosis NOT DETECTED NOT DETECTED Final   Candida tropicalis NOT DETECTED NOT DETECTED Final   Cryptococcus neoformans/gattii NOT DETECTED NOT DETECTED Final   CTX-M ESBL NOT DETECTED NOT DETECTED Final   Carbapenem resistance IMP NOT DETECTED NOT DETECTED Final   Carbapenem resistance KPC NOT DETECTED NOT DETECTED Final   Carbapenem resistance NDM NOT DETECTED NOT DETECTED Final   Carbapenem resist OXA 48 LIKE NOT DETECTED NOT DETECTED Final   Carbapenem resistance VIM NOT DETECTED NOT DETECTED Final    Comment: Performed at [facility], [address], [place], [place] [address]  Blood culture (routine single)     Status: Abnormal   Collection Time: 12/07/20  9:00 PM   Specimen: BLOOD  Result Value Ref Range Status   Specimen Description   Final    BLOOD RIGHT ANTECUBITAL Performed at [facility] [address], [place], [place] [address]    Special Requests   Final    BOTTLES DRAWN AEROBIC AND ANAEROBIC Blood Culture results may not be  optimal due to an excessive volume of blood received in culture bottles Performed  at [facility], [place] [address], [place], [place] [address]    Culture  Setup Time   Final    GRAM NEGATIVE RODS IN BOTH AEROBIC AND ANAEROBIC BOTTLES CRITICAL VALUE NOTED.  VALUE IS CONSISTENT WITH PREVIOUSLY REPORTED AND CALLED VALUE.    Culture (A)  Final    ESCHERICHIA COLI SUSCEPTIBILITIES PERFORMED ON PREVIOUS CULTURE WITHIN THE LAST 5 DAYS. Performed at [facility], [place] [address], [place], [place] [address]    Report Status 12/10/2020 FINAL  Final  Urine culture     Status: Abnormal   Collection Time: 12/07/20  9:41 PM   Specimen: In/Out Cath Urine  Result Value Ref Range Status   Specimen Description   Final    IN/OUT CATH URINE Performed at [facility] [address], [place], [place] [address]    Special Requests   Final    NONE Performed at [facility], [place] [address] [place], [place] [address]    Culture >=100,000 COLONIES/mL ESCHERICHIA COLI (A)  Final   Report Status 12/10/2020 FINAL  Final   Organism ID, Bacteria ESCHERICHIA COLI (A)  Final      Susceptibility   Escherichia coli - MIC*    AMPICILLIN <=2 SENSITIVE Sensitive     CEFAZOLIN <=4 SENSITIVE Sensitive     CEFEPIME <=0.12 SENSITIVE Sensitive     CEFTRIAXONE <=0.25 SENSITIVE Sensitive     CIPROFLOXACIN <=0.25 SENSITIVE Sensitive     GENTAMICIN <=1 SENSITIVE Sensitive     IMIPENEM <=0.25 SENSITIVE Sensitive     NITROFURANTOIN <=16 SENSITIVE Sensitive     TRIMETH/SULFA <=20 SENSITIVE Sensitive     AMPICILLIN/SULBACTAM <=2 SENSITIVE Sensitive     PIP/TAZO <=4 SENSITIVE Sensitive     * >=100,000 COLONIES/mL ESCHERICHIA COLI  Resp Panel by RT-PCR (Flu A&B, Covid) Nasopharyngeal Swab     Status: None   Collection Time: 12/07/20  9:46 PM   Specimen: Nasopharyngeal Swab; Nasopharyngeal(NP) swabs in vial transport medium  Result Value Ref Range Status   SARS  Coronavirus 2 by RT PCR NEGATIVE NEGATIVE Final    Comment: (NOTE) SARS-CoV-2 target nucleic acids are NOT DETECTED.  The SARS-CoV-2 RNA is generally detectable in upper respiratory specimens during the acute phase of infection. The lowest concentration of SARS-CoV-2 viral copies this assay can detect is 138 copies/mL. A negative result does not preclude SARS-Cov-2 infection and should not be used as the sole basis for treatment or other patient management decisions. A negative result may occur with  improper specimen collection/handling, submission of specimen other than nasopharyngeal swab, presence of viral mutation(s) within the areas targeted by this assay, and inadequate number of viral copies(<138 copies/mL). A negative result must be combined with clinical observations, patient history, and epidemiological information. The expected result is Negative.  Fact Sheet for Patients:  EntrepreneurPulse.com.au  Fact Sheet for Healthcare Providers:  IncredibleEmployment.be  This test is no t yet approved or cleared by the [place] FDA and  has been authorized for detection and/or diagnosis of SARS-CoV-2 by FDA under an Emergency Use Authorization (EUA). This EUA will remain  in effect (meaning this test can be used) for the duration of the COVID-19 declaration under Section 564(b)(1) of the Act, 21 U.S.C.section 360bbb-3(b)(1), unless the authorization is terminated  or revoked sooner.       Influenza A by PCR NEGATIVE NEGATIVE Final   Influenza B by PCR NEGATIVE NEGATIVE Final    Comment: (NOTE) The Xpert Xpress SARS-CoV-2/FLU/RSV  plus assay is intended as an aid in the diagnosis of influenza from Nasopharyngeal swab specimens and should not be used as a sole basis for treatment. Nasal washings and aspirates are unacceptable for Xpert Xpress SARS-CoV-2/FLU/RSV testing.  Fact Sheet for  Patients: EntrepreneurPulse.com.au  Fact Sheet for Healthcare Providers: IncredibleEmployment.be  This test is not yet approved or cleared by the [place] FDA and has been authorized for detection and/or diagnosis of SARS-CoV-2 by FDA under an Emergency Use Authorization (EUA). This EUA will remain in effect (meaning this test can be used) for the duration of the COVID-19 declaration under Section 564(b)(1) of the Act, 21 U.S.C. section 360bbb-3(b)(1), unless the authorization is terminated or revoked.  Performed at [facility], [place] [address], [place], [place] [address]    Labs: BNP (last 3 results) No results for input(s): BNP in the last 8760 hours. Basic Metabolic Panel: Recent Labs  Lab 12/07/20 2056 12/08/20 0305 12/09/20 0408 12/10/20 1105  NA 132* 134* 134* 135  K 4.3 3.9 3.9 4.3  CL 101 103 101 99  CO2 20* 23 24 27   GLUCOSE 104* 126* 108* 174*  BUN 35* 35* 29* 25*  CREATININE 2.66* 2.58* 2.33* 2.13*  CALCIUM 9.1 8.6* 8.5* 9.0  MG  --  1.2* 1.7 1.7  PHOS  --   --   --  2.5   Liver Function Tests: Recent Labs  Lab 12/07/20 2056 12/10/20 1105  AST 23 16  ALT 17 15  ALKPHOS 94 83  BILITOT 1.3* 0.3  PROT 8.1 7.1  ALBUMIN 3.8 2.9*   No results for input(s): LIPASE, AMYLASE in the last 168 hours. No results for input(s): AMMONIA in the last 168 hours. CBC: Recent Labs  Lab 12/07/20 2056 12/08/20 0305 12/09/20 0408 12/10/20 1105  WBC 17.0* 13.5* 9.5 7.5  NEUTROABS 14.6*  --  7.0 4.7  HGB 11.2* 9.2* 9.4* 9.8*  HCT 35.4* 29.0* 29.0* 30.5*  MCV 103.5* 103.9* 102.8* 104.1*  PLT 370 295 279 310   Cardiac Enzymes: No results for input(s): CKTOTAL, CKMB, CKMBINDEX, TROPONINI in the last 168 hours. BNP: Invalid input(s): POCBNP CBG: No results for input(s): GLUCAP in the last 168 hours. D-Dimer No results for input(s): DDIMER in the last 72 hours. Hgb A1c No results for input(s): HGBA1C in  the last 72 hours. Lipid Profile No results for input(s): CHOL, HDL, LDLCALC, TRIG, CHOLHDL, LDLDIRECT in the last 72 hours. Thyroid function studies No results for input(s): TSH, T4TOTAL, T3FREE, THYROIDAB in the last 72 hours.  Invalid input(s): FREET3 Anemia work up No results for input(s): VITAMINB12, FOLATE, FERRITIN, TIBC, IRON, RETICCTPCT in the last 72 hours. Urinalysis    Component Value Date/Time   COLORURINE YELLOW 12/07/2020 2141   APPEARANCEUR HAZY (A) 12/07/2020 2141   LABSPEC 1.020 12/07/2020 2141   PHURINE 6.0 12/07/2020 2141   GLUCOSEU NEGATIVE 12/07/2020 2141   GLUCOSEU NEGATIVE 03/14/2009 1603   HGBUR MODERATE (A) 12/07/2020 2141   BILIRUBINUR NEGATIVE 12/07/2020 2141   BILIRUBINUR Negative 01/06/2018 1306   KETONESUR NEGATIVE 12/07/2020 2141   PROTEINUR 100 (A) 12/07/2020 2141   UROBILINOGEN 1.0 01/06/2018 1306   UROBILINOGEN 0.2 04/12/2013 0900   NITRITE POSITIVE (A) 12/07/2020 2141   LEUKOCYTESUR SMALL (A) 12/07/2020 2141   Sepsis Labs Invalid input(s): PROCALCITONIN,  WBC,  LACTICIDVEN Microbiology Recent Results (from the past 240 hour(s))  Culture, blood (Routine X 2) w Reflex to ID Panel     Status: Abnormal   Collection Time: 12/07/20  8:56 PM   Specimen: BLOOD  Result Value Ref Range Status   Specimen Description   Final    BLOOD LEFT ANTECUBITAL Performed at [facility] [address] [place], [place] [address]    Special Requests   Final    BOTTLES DRAWN AEROBIC AND ANAEROBIC Blood Culture results may not be optimal due to an excessive volume of blood received in culture bottles Performed at [facility] [address], [place], [place] [address]    Culture  Setup Time   Final    GRAM NEGATIVE RODS IN BOTH AEROBIC AND ANAEROBIC BOTTLES CRITICAL RESULT CALLED TO, READ BACK BY AND VERIFIED WITH: Oswaldo Done [id number] AT 1441 BY CM Performed at [facility], [place] [address], [place],   [address]    Culture ESCHERICHIA COLI (A)  Final   Report Status 12/10/2020 FINAL  Final   Organism ID, Bacteria ESCHERICHIA COLI  Final      Susceptibility   Escherichia coli - MIC*    AMPICILLIN <=2 SENSITIVE Sensitive     CEFAZOLIN <=4 SENSITIVE Sensitive     CEFEPIME <=0.12 SENSITIVE Sensitive     CEFTAZIDIME <=1 SENSITIVE Sensitive     CEFTRIAXONE <=0.25 SENSITIVE Sensitive     CIPROFLOXACIN <=0.25 SENSITIVE Sensitive     GENTAMICIN <=1 SENSITIVE Sensitive     IMIPENEM <=0.25 SENSITIVE Sensitive     TRIMETH/SULFA <=20 SENSITIVE Sensitive     AMPICILLIN/SULBACTAM <=2 SENSITIVE Sensitive     PIP/TAZO <=4 SENSITIVE Sensitive     * ESCHERICHIA COLI  Blood Culture ID Panel (Reflexed)     Status: Abnormal (Preliminary result)   Collection Time: 12/07/20  8:56 PM  Result Value Ref Range Status   Enterococcus faecalis NOT DETECTED NOT DETECTED Final   Enterococcus Faecium NOT DETECTED NOT DETECTED Final   Listeria monocytogenes NOT DETECTED NOT DETECTED Final   Staphylococcus species NOT DETECTED NOT DETECTED Final   Staphylococcus aureus (BCID) NOT DETECTED NOT DETECTED Final   Staphylococcus epidermidis NOT DETECTED NOT DETECTED Final   Staphylococcus lugdunensis NOT DETECTED NOT DETECTED Final   Streptococcus species NOT DETECTED NOT DETECTED Final   Streptococcus agalactiae NOT DETECTED NOT DETECTED Final   Streptococcus pneumoniae NOT DETECTED NOT DETECTED Final   Streptococcus pyogenes NOT DETECTED NOT DETECTED Final   A.calcoaceticus-baumannii NOT DETECTED NOT DETECTED Final   Bacteroides fragilis NOT DETECTED NOT DETECTED Final   Enterobacterales PENDING NOT DETECTED Incomplete   Enterobacter cloacae complex NOT DETECTED NOT DETECTED Final   Escherichia coli DETECTED (A) NOT DETECTED Final    Comment: CRITICAL RESULT CALLED TO, READ BACK BY AND VERIFIED WITH: PHARMD M MICHAEL [id number] AT 1441 BY CM    Klebsiella aerogenes NOT DETECTED NOT DETECTED Final   Klebsiella  oxytoca NOT DETECTED NOT DETECTED Final   Klebsiella pneumoniae NOT DETECTED NOT DETECTED Final   Proteus species NOT DETECTED NOT DETECTED Final   Salmonella species NOT DETECTED NOT DETECTED Final   Serratia marcescens NOT DETECTED NOT DETECTED Final   Haemophilus influenzae NOT DETECTED NOT DETECTED Final   Neisseria meningitidis NOT DETECTED NOT DETECTED Final   Pseudomonas aeruginosa NOT DETECTED NOT DETECTED Final   Stenotrophomonas maltophilia NOT DETECTED NOT DETECTED Final   Candida albicans NOT DETECTED NOT DETECTED Final   Candida auris NOT DETECTED NOT DETECTED Final   Candida glabrata NOT DETECTED NOT DETECTED Final   Candida krusei NOT DETECTED NOT DETECTED Final   Candida parapsilosis NOT DETECTED NOT DETECTED  Final   Candida tropicalis NOT DETECTED NOT DETECTED Final   Cryptococcus neoformans/gattii NOT DETECTED NOT DETECTED Final   CTX-M ESBL NOT DETECTED NOT DETECTED Final   Carbapenem resistance IMP NOT DETECTED NOT DETECTED Final   Carbapenem resistance KPC NOT DETECTED NOT DETECTED Final   Carbapenem resistance NDM NOT DETECTED NOT DETECTED Final   Carbapenem resist OXA 48 LIKE NOT DETECTED NOT DETECTED Final   Carbapenem resistance VIM NOT DETECTED NOT DETECTED Final    Comment: Performed at [facility], [place] [address], [place], [place] [address]  Blood culture (routine single)     Status: Abnormal   Collection Time: 12/07/20  9:00 PM   Specimen: BLOOD  Result Value Ref Range Status   Specimen Description   Final    BLOOD RIGHT ANTECUBITAL Performed at [facility] [address], [place], [place] [address]    Special Requests   Final    BOTTLES DRAWN AEROBIC AND ANAEROBIC Blood Culture results may not be optimal due to an excessive volume of blood received in culture bottles Performed at [facility] [address] [place], [place] [address]    Culture  Setup Time   Final    GRAM NEGATIVE RODS IN BOTH AEROBIC  AND ANAEROBIC BOTTLES CRITICAL VALUE NOTED.  VALUE IS CONSISTENT WITH PREVIOUSLY REPORTED AND CALLED VALUE.    Culture (A)  Final    ESCHERICHIA COLI SUSCEPTIBILITIES PERFORMED ON PREVIOUS CULTURE WITHIN THE LAST 5 DAYS. Performed at [facility], [place] [address], [place], [place] [address]    Report Status 12/10/2020 FINAL  Final  Urine culture     Status: Abnormal   Collection Time: 12/07/20  9:41 PM   Specimen: In/Out Cath Urine  Result Value Ref Range Status   Specimen Description   Final    IN/OUT CATH URINE Performed at [facility] [address] [place], [place] [address]    Special Requests   Final    NONE Performed at [facility], [place] [address] [name], [place] [address]    Culture >=100,000 COLONIES/mL ESCHERICHIA COLI (A)  Final   Report Status 12/10/2020 FINAL  Final   Organism ID, Bacteria ESCHERICHIA COLI (A)  Final      Susceptibility   Escherichia coli - MIC*    AMPICILLIN <=2 SENSITIVE Sensitive     CEFAZOLIN <=4 SENSITIVE Sensitive     CEFEPIME <=0.12 SENSITIVE Sensitive     CEFTRIAXONE <=0.25 SENSITIVE Sensitive     CIPROFLOXACIN <=0.25 SENSITIVE Sensitive     GENTAMICIN <=1 SENSITIVE Sensitive     IMIPENEM <=0.25 SENSITIVE Sensitive     NITROFURANTOIN <=16 SENSITIVE Sensitive     TRIMETH/SULFA <=20 SENSITIVE Sensitive     AMPICILLIN/SULBACTAM <=2 SENSITIVE Sensitive     PIP/TAZO <=4 SENSITIVE Sensitive     * >=100,000 COLONIES/mL ESCHERICHIA COLI  Resp Panel by RT-PCR (Flu A&B, Covid) Nasopharyngeal Swab     Status: None   Collection Time: 12/07/20  9:46 PM   Specimen: Nasopharyngeal Swab; Nasopharyngeal(NP) swabs in vial transport medium  Result Value Ref Range Status   SARS Coronavirus 2 by RT PCR NEGATIVE NEGATIVE Final    Comment: (NOTE) SARS-CoV-2 target nucleic acids are NOT DETECTED.  The SARS-CoV-2 RNA is generally detectable in upper respiratory specimens during the acute phase of infection.  The lowest concentration of SARS-CoV-2 viral copies this assay can detect is 138 copies/mL. A negative result does not preclude SARS-Cov-2 infection  and should not be used as the sole basis for treatment or other patient management decisions. A negative result may occur with  improper specimen collection/handling, submission of specimen other than nasopharyngeal swab, presence of viral mutation(s) within the areas targeted by this assay, and inadequate number of viral copies(<138 copies/mL). A negative result must be combined with clinical observations, patient history, and epidemiological information. The expected result is Negative.  Fact Sheet for Patients:  EntrepreneurPulse.com.au  Fact Sheet for Healthcare Providers:  IncredibleEmployment.be  This test is no t yet approved or cleared by the [place] FDA and  has been authorized for detection and/or diagnosis of SARS-CoV-2 by FDA under an Emergency Use Authorization (EUA). This EUA will remain  in effect (meaning this test can be used) for the duration of the COVID-19 declaration under Section 564(b)(1) of the Act, 21 U.S.C.section 360bbb-3(b)(1), unless the authorization is terminated  or revoked sooner.       Influenza A by PCR NEGATIVE NEGATIVE Final   Influenza B by PCR NEGATIVE NEGATIVE Final    Comment: (NOTE) The Xpert Xpress SARS-CoV-2/FLU/RSV plus assay is intended as an aid in the diagnosis of influenza from Nasopharyngeal swab specimens and should not be used as a sole basis for treatment. Nasal washings and aspirates are unacceptable for Xpert Xpress SARS-CoV-2/FLU/RSV testing.  Fact Sheet for Patients: EntrepreneurPulse.com.au  Fact Sheet for Healthcare Providers: IncredibleEmployment.be  This test is not yet approved or cleared by the [place] FDA and has been authorized for detection and/or diagnosis of SARS-CoV-2 by FDA  under an Emergency Use Authorization (EUA). This EUA will remain in effect (meaning this test can be used) for the duration of the COVID-19 declaration under Section 564(b)(1) of the Act, 21 U.S.C. section 360bbb-3(b)(1), unless the authorization is terminated or revoked.  Performed at [facility], [place] [address] [place], [place] [address]    Time coordinating discharge: 35 minutes  SIGNED:  Kerney Elbe, DO [facility] 12/10/2020, 5:29 PM Pager is on [place]  If 7PM-7AM, please contact night-coverage [email or web address]

## 2020-12-10 NOTE — Progress Notes (Incomplete)
PROGRESS NOTE    [name]  SFS:[id number] DOB: 1956/08/18 DOA: 12/07/2020 PCP: Ann Held, DO (Confirm with patient/family/NH records and if not entered, this HAS to be entered at [facility] point of entry. "No PCP" if truly none.)   Brief Narrative: (Start on day 1 of progress note - keep it brief and live) ***   Assessment & Plan:   Principal Problem:   Severe sepsis ([place]) Active Problems:   Essential hypertension   IBD (inflammatory bowel disease)   Acute renal failure superimposed on stage 4 chronic kidney disease (HCC)   Acute pyelonephritis   ***   DVT prophylaxis: (Lovenox/Heparin/SCD's/anticoagulated/None (if comfort care) Code Status: (Full/Partial - specify details) Family Communication: (Specify name, relationship & date discussed. NO "discussed with patient") Disposition Plan: (specify when and where you expect patient to be discharged). Include barriers to DC in this tab.  Status is: Inpatient  {Inpatient:23812}  Dispo: The patient is from: {From:23814}              Anticipated d/c is to: {To:23815}              Patient currently {Medically stable:23817}   Difficult to place patient {Yes/No:25151}        Consultants:   ***  Procedures: (Don't include imaging studies which can be auto populated. Include things that cannot be auto populated i.e. Echo, Carotid and venous dopplers, Foley, Bipap, HD, tubes/drains, wound vac, central lines etc)  ***  Antimicrobials: (specify start and planned stop date. Auto populated tables are space occupying and do not give end dates) Anti-infectives (From admission, onward)   Start     Dose/Rate Route Frequency Ordered Stop   12/08/20 2200  cefTRIAXone (ROCEPHIN) 1 g in sodium chloride 0.9 % 100 mL IVPB  Status:  Discontinued        1 g 200 mL/hr over 30 Minutes Intravenous Every 24 hours 12/08/20 0006 12/08/20 1522   12/08/20 1600  cefTRIAXone (ROCEPHIN) 2 g in sodium chloride 0.9 % 100 mL IVPB        2  g 200 mL/hr over 30 Minutes Intravenous Daily 12/08/20 1522     12/07/20 2230  vancomycin (VANCOREADY) IVPB 2000 mg/400 mL  Status:  Discontinued        2,000 mg 200 mL/hr over 120 Minutes Intravenous  Once 12/07/20 2217 12/08/20 0006   12/07/20 2215  ceFEPIme (MAXIPIME) 2 g in sodium chloride 0.9 % 100 mL IVPB        2 g 200 mL/hr over 30 Minutes Intravenous  Once 12/07/20 2214 12/07/20 2308   12/07/20 2215  metroNIDAZOLE (FLAGYL) IVPB 500 mg        500 mg 100 mL/hr over 60 Minutes Intravenous  Once 12/07/20 2214 12/08/20 0014   12/07/20 2215  vancomycin (VANCOCIN) IVPB 1000 mg/200 mL premix  Status:  Discontinued        1,000 mg 200 mL/hr over 60 Minutes Intravenous  Once 12/07/20 2214 12/07/20 2217        Subjective: ***  Objective: Vitals:   12/09/20 0412 12/09/20 1315 12/09/20 2004 12/10/20 0352  BP: (!) 158/88 (!) 148/87 (!) 171/95 (!) 149/86  Pulse: 99 84 89 86  Resp: 18 16 18 18   Temp: 99.7 F (37.6 C) 98.3 F (36.8 C) 99.1 F (37.3 C) 98.7 F (37.1 C)  TempSrc: Oral Oral  Oral  SpO2: 94% 98% 98% 96%  Weight:      Height:  No intake or output data in the 24 hours ending 12/10/20 0845 Filed Weights   12/07/20 2045 12/08/20 0224  Weight: 90.7 kg 88.1 kg    Examination: Physical Exam:  Constitutional: WN/WD, NAD and appears calm and comfortable Eyes: PERRL, lids and conjunctivae normal, sclerae anicteric  ENMT: External Ears, Nose appear normal. Grossly normal hearing. Mucous membranes are moist. Posterior pharynx clear of any exudate or lesions. Normal dentition.  Neck: Appears normal, supple, no cervical masses, normal ROM, no appreciable thyromegaly Respiratory: Clear to auscultation bilaterally, no wheezing, rales, rhonchi or crackles. Normal respiratory effort and patient is not tachypenic. No accessory muscle use.  Cardiovascular: RRR, no murmurs / rubs / gallops. S1 and S2 auscultated. No extremity edema. 2+ pedal pulses. No carotid bruits.   Abdomen: Soft, non-tender, non-distended. No masses palpated. No appreciable hepatosplenomegaly. Bowel sounds positive.  GU: Deferred. Musculoskeletal: No clubbing / cyanosis of digits/nails. No joint deformity upper and lower extremities. Good ROM, no contractures. Normal strength and muscle tone.  Skin: No rashes, lesions, ulcers. No induration; Warm and dry.  Neurologic: CN 2-12 grossly intact with no focal deficits. Sensation intact in all 4 Extremities, DTR normal. Strength 5/5 in all 4. Romberg sign cerebellar reflexes not assessed.  Psychiatric: Normal judgment and insight. Alert and oriented x 3. Normal mood and appropriate affect.   Data Reviewed: I have personally reviewed following labs and imaging studies  CBC: Recent Labs  Lab 12/07/20 2056 12/08/20 0305 12/09/20 0408  WBC 17.0* 13.5* 9.5  NEUTROABS 14.6*  --  7.0  HGB 11.2* 9.2* 9.4*  HCT 35.4* 29.0* 29.0*  MCV 103.5* 103.9* 102.8*  PLT 370 295 144   Basic Metabolic Panel: Recent Labs  Lab 12/07/20 2056 12/08/20 0305 12/09/20 0408  NA 132* 134* 134*  K 4.3 3.9 3.9  CL 101 103 101  CO2 20* 23 24  GLUCOSE 104* 126* 108*  BUN 35* 35* 29*  CREATININE 2.66* 2.58* 2.33*  CALCIUM 9.1 8.6* 8.5*  MG  --  1.2* 1.7   GFR: Estimated Creatinine Clearance: 25 mL/min (A) (by C-G formula based on SCr of 2.33 mg/dL (H)). Liver Function Tests: Recent Labs  Lab 12/07/20 2056  AST 23  ALT 17  ALKPHOS 94  BILITOT 1.3*  PROT 8.1  ALBUMIN 3.8   No results for input(s): LIPASE, AMYLASE in the last 168 hours. No results for input(s): AMMONIA in the last 168 hours. Coagulation Profile: Recent Labs  Lab 12/07/20 2056  INR 1.0   Cardiac Enzymes: No results for input(s): CKTOTAL, CKMB, CKMBINDEX, TROPONINI in the last 168 hours. BNP (last 3 results) No results for input(s): PROBNP in the last 8760 hours. HbA1C: No results for input(s): HGBA1C in the last 72 hours. CBG: No results for input(s): GLUCAP in the last  168 hours. Lipid Profile: No results for input(s): CHOL, HDL, LDLCALC, TRIG, CHOLHDL, LDLDIRECT in the last 72 hours. Thyroid Function Tests: No results for input(s): TSH, T4TOTAL, FREET4, T3FREE, THYROIDAB in the last 72 hours. Anemia Panel: No results for input(s): VITAMINB12, FOLATE, FERRITIN, TIBC, IRON, RETICCTPCT in the last 72 hours. Sepsis Labs: Recent Labs  Lab 12/07/20 2037 12/08/20 0305  PROCALCITON  --  16.32  LATICACIDVEN 1.9  --     Recent Results (from the past 240 hour(s))  Culture, blood (Routine X 2) w Reflex to ID Panel     Status: Abnormal   Collection Time: 12/07/20  8:56 PM   Specimen: BLOOD  Result Value Ref Range Status  Specimen Description   Final    BLOOD LEFT ANTECUBITAL Performed at [facility] [address], [place], [place] [address]    Special Requests   Final    BOTTLES DRAWN AEROBIC AND ANAEROBIC Blood Culture results may not be optimal due to an excessive volume of blood received in culture bottles Performed at [facility] [address], [place], [place] [address]    Culture  Setup Time   Final    GRAM NEGATIVE RODS IN BOTH AEROBIC AND ANAEROBIC BOTTLES CRITICAL RESULT CALLED TO, READ BACK BY AND VERIFIED WITH: Oswaldo Done [id number] AT 1441 BY CM Performed at [facility], [place] [address], [place], [place] [address]    Culture ESCHERICHIA COLI (A)  Final   Report Status 12/10/2020 FINAL  Final   Organism ID, Bacteria ESCHERICHIA COLI  Final      Susceptibility   Escherichia coli - MIC*    AMPICILLIN <=2 SENSITIVE Sensitive     CEFAZOLIN <=4 SENSITIVE Sensitive     CEFEPIME <=0.12 SENSITIVE Sensitive     CEFTAZIDIME <=1 SENSITIVE Sensitive     CEFTRIAXONE <=0.25 SENSITIVE Sensitive     CIPROFLOXACIN <=0.25 SENSITIVE Sensitive     GENTAMICIN <=1 SENSITIVE Sensitive     IMIPENEM <=0.25 SENSITIVE Sensitive     TRIMETH/SULFA <=20 SENSITIVE Sensitive     AMPICILLIN/SULBACTAM <=2 SENSITIVE  Sensitive     PIP/TAZO <=4 SENSITIVE Sensitive     * ESCHERICHIA COLI  Blood Culture ID Panel (Reflexed)     Status: Abnormal (Preliminary result)   Collection Time: 12/07/20  8:56 PM  Result Value Ref Range Status   Enterococcus faecalis NOT DETECTED NOT DETECTED Final   Enterococcus Faecium NOT DETECTED NOT DETECTED Final   Listeria monocytogenes NOT DETECTED NOT DETECTED Final   Staphylococcus species NOT DETECTED NOT DETECTED Final   Staphylococcus aureus (BCID) NOT DETECTED NOT DETECTED Final   Staphylococcus epidermidis NOT DETECTED NOT DETECTED Final   Staphylococcus lugdunensis NOT DETECTED NOT DETECTED Final   Streptococcus species NOT DETECTED NOT DETECTED Final   Streptococcus agalactiae NOT DETECTED NOT DETECTED Final   Streptococcus pneumoniae NOT DETECTED NOT DETECTED Final   Streptococcus pyogenes NOT DETECTED NOT DETECTED Final   A.calcoaceticus-baumannii NOT DETECTED NOT DETECTED Final   Bacteroides fragilis NOT DETECTED NOT DETECTED Final   Enterobacterales PENDING NOT DETECTED Incomplete   Enterobacter cloacae complex NOT DETECTED NOT DETECTED Final   Escherichia coli DETECTED (A) NOT DETECTED Final    Comment: CRITICAL RESULT CALLED TO, READ BACK BY AND VERIFIED WITH: PHARMD M MICHAEL [id number] AT 1441 BY CM    Klebsiella aerogenes NOT DETECTED NOT DETECTED Final   Klebsiella oxytoca NOT DETECTED NOT DETECTED Final   Klebsiella pneumoniae NOT DETECTED NOT DETECTED Final   Proteus species NOT DETECTED NOT DETECTED Final   Salmonella species NOT DETECTED NOT DETECTED Final   Serratia marcescens NOT DETECTED NOT DETECTED Final   Haemophilus influenzae NOT DETECTED NOT DETECTED Final   Neisseria meningitidis NOT DETECTED NOT DETECTED Final   Pseudomonas aeruginosa NOT DETECTED NOT DETECTED Final   Stenotrophomonas maltophilia NOT DETECTED NOT DETECTED Final   Candida albicans NOT DETECTED NOT DETECTED Final   Candida auris NOT DETECTED NOT DETECTED Final   Candida  glabrata NOT DETECTED NOT DETECTED Final   Candida krusei NOT DETECTED NOT DETECTED Final   Candida parapsilosis NOT DETECTED NOT DETECTED Final   Candida tropicalis NOT DETECTED NOT DETECTED Final   Cryptococcus neoformans/gattii  NOT DETECTED NOT DETECTED Final   CTX-M ESBL NOT DETECTED NOT DETECTED Final   Carbapenem resistance IMP NOT DETECTED NOT DETECTED Final   Carbapenem resistance KPC NOT DETECTED NOT DETECTED Final   Carbapenem resistance NDM NOT DETECTED NOT DETECTED Final   Carbapenem resist OXA 48 LIKE NOT DETECTED NOT DETECTED Final   Carbapenem resistance VIM NOT DETECTED NOT DETECTED Final    Comment: Performed at [facility], [place] [address] [place], [place] [address]  Blood culture (routine single)     Status: Abnormal   Collection Time: 12/07/20  9:00 PM   Specimen: BLOOD  Result Value Ref Range Status   Specimen Description   Final    BLOOD RIGHT ANTECUBITAL Performed at [facility] [address] [place], [place] [address]    Special Requests   Final    BOTTLES DRAWN AEROBIC AND ANAEROBIC Blood Culture results may not be optimal due to an excessive volume of blood received in culture bottles Performed at [facility] [address] [place], [place] [address]    Culture  Setup Time   Final    GRAM NEGATIVE RODS IN BOTH AEROBIC AND ANAEROBIC BOTTLES CRITICAL VALUE NOTED.  VALUE IS CONSISTENT WITH PREVIOUSLY REPORTED AND CALLED VALUE.    Culture (A)  Final    ESCHERICHIA COLI SUSCEPTIBILITIES PERFORMED ON PREVIOUS CULTURE WITHIN THE LAST 5 DAYS. Performed at [facility], [place] [address], [place], [place] [address]    Report Status 12/10/2020 FINAL  Final  Urine culture     Status: Abnormal   Collection Time: 12/07/20  9:41 PM   Specimen: In/Out Cath Urine  Result Value Ref Range Status   Specimen Description   Final    IN/OUT CATH URINE Performed at [facility] [address]  [place], [place] [address]    Special Requests   Final    NONE Performed at [facility], [place] [address] [place], [place] [address]    Culture >=100,000 COLONIES/mL ESCHERICHIA COLI (A)  Final   Report Status 12/10/2020 FINAL  Final   Organism ID, Bacteria ESCHERICHIA COLI (A)  Final      Susceptibility   Escherichia coli - MIC*    AMPICILLIN <=2 SENSITIVE Sensitive     CEFAZOLIN <=4 SENSITIVE Sensitive     CEFEPIME <=0.12 SENSITIVE Sensitive     CEFTRIAXONE <=0.25 SENSITIVE Sensitive     CIPROFLOXACIN <=0.25 SENSITIVE Sensitive     GENTAMICIN <=1 SENSITIVE Sensitive     IMIPENEM <=0.25 SENSITIVE Sensitive     NITROFURANTOIN <=16 SENSITIVE Sensitive     TRIMETH/SULFA <=20 SENSITIVE Sensitive     AMPICILLIN/SULBACTAM <=2 SENSITIVE Sensitive     PIP/TAZO <=4 SENSITIVE Sensitive     * >=100,000 COLONIES/mL ESCHERICHIA COLI  Resp Panel by RT-PCR (Flu A&B, Covid) Nasopharyngeal Swab     Status: None   Collection Time: 12/07/20  9:46 PM   Specimen: Nasopharyngeal Swab; Nasopharyngeal(NP) swabs in vial transport medium  Result Value Ref Range Status   SARS Coronavirus 2 by RT PCR NEGATIVE NEGATIVE Final    Comment: (NOTE) SARS-CoV-2 target nucleic acids are NOT DETECTED.  The SARS-CoV-2 RNA is generally detectable in upper respiratory specimens during the acute phase of infection. The lowest concentration of SARS-CoV-2 viral copies this assay can detect is 138 copies/mL. A negative result does not preclude SARS-Cov-2 infection and should not be used as the sole basis for treatment or other patient management  decisions. A negative result may occur with  improper specimen collection/handling, submission of specimen other than nasopharyngeal swab, presence of viral mutation(s) within the areas targeted by this assay, and inadequate number of viral copies(<138 copies/mL). A negative result must be combined with clinical observations, patient history, and  epidemiological information. The expected result is Negative.  Fact Sheet for Patients:  EntrepreneurPulse.com.au  Fact Sheet for Healthcare Providers:  IncredibleEmployment.be  This test is no t yet approved or cleared by the [place] FDA and  has been authorized for detection and/or diagnosis of SARS-CoV-2 by FDA under an Emergency Use Authorization (EUA). This EUA will remain  in effect (meaning this test can be used) for the duration of the COVID-19 declaration under Section 564(b)(1) of the Act, 21 U.S.C.section 360bbb-3(b)(1), unless the authorization is terminated  or revoked sooner.       Influenza A by PCR NEGATIVE NEGATIVE Final   Influenza B by PCR NEGATIVE NEGATIVE Final    Comment: (NOTE) The Xpert Xpress SARS-CoV-2/FLU/RSV plus assay is intended as an aid in the diagnosis of influenza from Nasopharyngeal swab specimens and should not be used as a sole basis for treatment. Nasal washings and aspirates are unacceptable for Xpert Xpress SARS-CoV-2/FLU/RSV testing.  Fact Sheet for Patients: EntrepreneurPulse.com.au  Fact Sheet for Healthcare Providers: IncredibleEmployment.be  This test is not yet approved or cleared by the [place] FDA and has been authorized for detection and/or diagnosis of SARS-CoV-2 by FDA under an Emergency Use Authorization (EUA). This EUA will remain in effect (meaning this test can be used) for the duration of the COVID-19 declaration under Section 564(b)(1) of the Act, 21 U.S.C. section 360bbb-3(b)(1), unless the authorization is terminated or revoked.  Performed at [facility], [place] [address] [place], [place] [address]      RN Pressure Injury Documentation:     Estimated body mass index is 34.41 kg/m as calculated from the following:   Height as of this encounter: 5' 3"  (1.6 m).   Weight as of this encounter: 88.1  kg.  Malnutrition Type:      Malnutrition Characteristics:      Nutrition Interventions:        Radiology Studies: No results found.      Scheduled Meds: . DULoxetine  120 mg Oral Daily  . famotidine  20 mg Oral Daily  . gabapentin  100 mg Oral TID  . heparin  5,000 Units Subcutaneous Q8H  . metoprolol succinate  100 mg Oral Daily  . metoprolol succinate  50 mg Oral QHS   Continuous Infusions: . cefTRIAXone (ROCEPHIN)  IV 2 g (12/09/20 0935)     LOS: 2 days    Time spent: ***    Kerney Elbe, DO [facility] PAGER is on [place]  If 7PM-7AM, please contact night-coverage [email or web address]

## 2020-12-10 NOTE — Progress Notes (Signed)
OT Cancellation Note  Patient Details Name: [name] MRN: [id number] DOB: 1956/03/06   Cancelled Treatment:    Reason Eval/Treat Not Completed: OT screened, no needs identified, will sign off. Patient reports back to her baseline, as been up to bathroom multiple times this AM. No OT needs/concerns.  Delbert Phenix OT OT pager: [place] 12/10/2020, 2:10 PM

## 2020-12-10 NOTE — Care Management Important Message (Signed)
Important Message  Patient Details IM Letter given to the Patient. Name: Dusty Wagoner MRN: [id number] Date of Birth: 1956/08/16   Medicare Important Message Given:        Kerin Salen 12/10/2020, 4:16 PM

## 2020-12-10 NOTE — Progress Notes (Signed)
PT Cancellation Note  Patient Details Name: [name] MRN: [id number] DOB: [birth year]   Cancelled Treatment:    Reason Eval/Treat Not Completed: PT screened, no needs identified, will sign off. Patient reports she is independent at baseline with no device. She has been mobilizing in hospital room independently per self and NT. She has not acute PT needs, will sign off at this time. Please re-consult if there is a change in functional status.  Verner Mould, DPT Acute Rehabilitation Services Office [phone] Pager [phone]

## 2020-12-11 ENCOUNTER — Telehealth: Payer: Self-pay

## 2020-12-11 NOTE — Telephone Encounter (Signed)
Transition Care Management Follow-up Telephone Call  Date of discharge and from where: 12/10/20-[facility]  How have you been since you were released from the hospital? Been doing ok  Any questions or concerns? No  Items Reviewed:  Did the pt receive and understand the discharge instructions provided? Yes   Medications obtained and verified? Yes   Other? Yes   Any new allergies since your discharge? No   Dietary orders reviewed? Yes  Do you have support at home? Yes   Home Care and Equipment/Supplies: Were home health services ordered? no If so, what is the name of the agency? n/a  Has the agency set up a time to come to the patient's home? not applicable Were any new equipment or medical supplies ordered?  No What is the name of the medical supply agency? n/a Were you able to get the supplies/equipment? not applicable Do you have any questions related to the use of the equipment or supplies? n/a  Functional Questionnaire: (I = Independent and D = Dependent) ADLs: I  Bathing/Dressing- I  Meal Prep- I  Eating- I  Maintaining continence- I  Transferring/Ambulation- I  Managing Meds- I  Follow up appointments reviewed:   PCP Hospital f/u appt confirmed? Yes  Scheduled to see Dr. Etter Sjogren on 12/18/2020 @ 11:20.  [facility] f/u appt confirmed? N/A  Are transportation arrangements needed? No   If their condition worsens, is the pt aware to call PCP or go to the Emergency Dept.? Yes  Was the patient provided with contact information for the PCP's office or ED? Yes  Was to pt encouraged to call back with questions or concerns? Yes

## 2020-12-11 NOTE — Telephone Encounter (Signed)
Transition Care Management Unsuccessful Follow-up Telephone Call  Date of discharge and from where:  12/10/20-[facility]  Attempts:  1st Attempt  Reason for unsuccessful TCM follow-up call:  Left voice message

## 2020-12-18 ENCOUNTER — Encounter: Payer: Self-pay | Admitting: Family Medicine

## 2020-12-18 ENCOUNTER — Ambulatory Visit (INDEPENDENT_AMBULATORY_CARE_PROVIDER_SITE_OTHER): Payer: Medicare Other | Admitting: Family Medicine

## 2020-12-18 ENCOUNTER — Other Ambulatory Visit: Payer: Self-pay

## 2020-12-18 VITALS — BP 148/88 | HR 84 | Temp 98.4°F | Resp 18 | Ht 63.0 in | Wt 195.6 lb

## 2020-12-18 DIAGNOSIS — E213 Hyperparathyroidism, unspecified: Secondary | ICD-10-CM

## 2020-12-18 DIAGNOSIS — N184 Chronic kidney disease, stage 4 (severe): Secondary | ICD-10-CM | POA: Diagnosis not present

## 2020-12-18 DIAGNOSIS — I10 Essential (primary) hypertension: Secondary | ICD-10-CM | POA: Diagnosis not present

## 2020-12-18 DIAGNOSIS — D849 Immunodeficiency, unspecified: Secondary | ICD-10-CM

## 2020-12-18 DIAGNOSIS — E785 Hyperlipidemia, unspecified: Secondary | ICD-10-CM

## 2020-12-18 DIAGNOSIS — E1169 Type 2 diabetes mellitus with other specified complication: Secondary | ICD-10-CM | POA: Diagnosis not present

## 2020-12-18 DIAGNOSIS — R6520 Severe sepsis without septic shock: Secondary | ICD-10-CM

## 2020-12-18 DIAGNOSIS — M1A0790 Idiopathic chronic gout, unspecified ankle and foot, without tophus (tophi): Secondary | ICD-10-CM

## 2020-12-18 DIAGNOSIS — D6852 Prothrombin gene mutation: Secondary | ICD-10-CM

## 2020-12-18 DIAGNOSIS — N259 Disorder resulting from impaired renal tubular function, unspecified: Secondary | ICD-10-CM | POA: Diagnosis not present

## 2020-12-18 DIAGNOSIS — F322 Major depressive disorder, single episode, severe without psychotic features: Secondary | ICD-10-CM | POA: Insufficient documentation

## 2020-12-18 DIAGNOSIS — A419 Sepsis, unspecified organism: Secondary | ICD-10-CM | POA: Diagnosis not present

## 2020-12-18 LAB — COMPREHENSIVE METABOLIC PANEL
ALT: 12 U/L (ref 0–35)
AST: 13 U/L (ref 0–37)
Albumin: 3.8 g/dL (ref 3.5–5.2)
Alkaline Phosphatase: 113 U/L (ref 39–117)
BUN: 35 mg/dL — ABNORMAL HIGH (ref 6–23)
CO2: 25 mEq/L (ref 19–32)
Calcium: 9.8 mg/dL (ref 8.4–10.5)
Chloride: 104 mEq/L (ref 96–112)
Creatinine, Ser: 2.24 mg/dL — ABNORMAL HIGH (ref 0.40–1.20)
GFR: 22.23 mL/min — ABNORMAL LOW (ref >60.00)
Glucose, Bld: 93 mg/dL (ref 70–99)
Potassium: 4.9 mEq/L (ref 3.5–5.1)
Sodium: 137 mEq/L (ref 135–145)
Total Bilirubin: 0.3 mg/dL (ref 0.2–1.2)
Total Protein: 7.6 g/dL (ref 6.0–8.3)

## 2020-12-18 LAB — CBC WITH DIFFERENTIAL/PLATELET
Basophils Absolute: 0.1 10*3/uL (ref 0.0–0.1)
Basophils Relative: 1.1 % (ref 0.0–3.0)
Eosinophils Absolute: 0.8 10*3/uL — ABNORMAL HIGH (ref 0.0–0.7)
Eosinophils Relative: 8.2 % — ABNORMAL HIGH (ref 0.0–5.0)
HCT: 32.5 % — ABNORMAL LOW (ref 36.0–46.0)
Hemoglobin: 10.6 g/dL — ABNORMAL LOW (ref 12.0–15.0)
Lymphocytes Relative: 28.3 % (ref 12.0–46.0)
Lymphs Abs: 2.9 10*3/uL (ref 0.7–4.0)
MCHC: 32.6 g/dL (ref 30.0–36.0)
MCV: 100 fl (ref 78.0–100.0)
Monocytes Absolute: 0.9 10*3/uL (ref 0.1–1.0)
Monocytes Relative: 9.4 % (ref 3.0–12.0)
Neutro Abs: 5.4 10*3/uL (ref 1.4–7.7)
Neutrophils Relative %: 53 % (ref 43.0–77.0)
Platelets: 522 10*3/uL — ABNORMAL HIGH (ref 150.0–400.0)
RBC: 3.25 Mil/uL — ABNORMAL LOW (ref 3.87–5.11)
RDW: 14.8 % (ref 11.5–15.5)
WBC: 10.1 10*3/uL (ref 4.0–10.5)

## 2020-12-18 LAB — POC URINALSYSI DIPSTICK (AUTOMATED)
Bilirubin, UA: NEGATIVE
Glucose, UA: NEGATIVE
Ketones, UA: NEGATIVE
Leukocytes, UA: NEGATIVE
Nitrite, UA: NEGATIVE
Protein, UA: POSITIVE — AB
Spec Grav, UA: >=1.03 — AB (ref 1.010–1.025)
Urobilinogen, UA: NEGATIVE E.U./dL — AB
pH, UA: 6 (ref 5.0–8.0)

## 2020-12-18 LAB — MAGNESIUM: Magnesium: 1.4 mg/dL — ABNORMAL LOW (ref 1.5–2.5)

## 2020-12-18 LAB — URIC ACID: Uric Acid, Serum: 4.1 mg/dL (ref 2.4–7.0)

## 2020-12-18 LAB — PHOSPHORUS: Phosphorus: 4.3 mg/dL (ref 2.3–4.6)

## 2020-12-18 NOTE — Assessment & Plan Note (Signed)
Well controlled, no changes to meds. Encouraged heart healthy diet such as the DASH diet and exercise as tolerated.  °

## 2020-12-18 NOTE — Assessment & Plan Note (Signed)
Per nephrology 

## 2020-12-18 NOTE — Patient Instructions (Signed)
Sepsis, Diagnosis, Adult Sepsis is a serious bodily reaction to an infection. The infection that triggers sepsis may be from a bacteria, virus, or fungus. Sepsis can result from an infection in any part of your body. Infections that commonly lead to sepsis include skin, lung, and urinary tract infections. Sepsis is a medical emergency that must be treated right away in a hospital. In severe cases, it can lead to septic shock. Septic shock can weaken your heart and cause your blood pressure to drop. This can cause your central nervous system and your body's organs to stop working. What are the causes? This condition is caused by a severe reaction to infections from bacteria, viruses, or fungus. The germs that most often lead to sepsis include:  Escherichia coli (E. coli) bacteria.  Staphylococcus aureus (staph) bacteria.  Some types of Streptococcus bacteria. The most common infections affect these organs:  The lung (pneumonia).  The kidneys or bladder (urinary tract infection).  The skin (cellulitis).  The bowel, gallbladder, or pancreas. What increases the risk? You are more likely to develop this condition if:  Your body's disease-fighting system (immune system) is weakened.  You are age 22 or older.  You are female.  You had surgery or you have been hospitalized.  You have these devices inserted into your body: ? A small, thin tube (catheter). ? IV line. ? Breathing tube. ? Drainage tube.  You are not getting enough nutrients from food (malnourished).  You have a long-term (chronic) disease, such as cancer, lung disease, kidney disease, or diabetes.  You are African American. What are the signs or symptoms? Symptoms of this condition may include:  Fever.  Chills or feeling very cold.  Confusion or anxiety.  Fatigue.  Muscle aches.  Shortness of breath.  Nausea and vomiting.  Urinating much less than usual.  Fast heart rate (tachycardia).  Rapid  breathing (hyperventilation).  Changes in skin color. Your skin may look blotchy, pale, or blue.  Cool, clammy, or sweaty skin.  Skin rash. Other symptoms depend on the source of your infection. How is this diagnosed? This condition is diagnosed based on:  Your symptoms.  Your medical history.  A physical exam. Other tests may also be done to find out the cause of the infection and how severe the sepsis is. These tests may include:  Blood tests.  Urine tests.  Swabs from other areas of your body that may have an infection. These samples may be tested (cultured) to find out what type of bacteria is causing the infection.  Chest X-ray to check for pneumonia. Other imaging tests, such as a CT scan, may also be done.  Lumbar puncture. This removes a small amount of the fluid that surrounds your brain and spinal cord. The fluid is then examined for infection.   How is this treated? This condition must be treated in a hospital. Based on the cause of your infection, you may be given an antibiotic, antiviral, or antifungal medicine. You may also receive:  Fluids through an IV.  Oxygen and breathing assistance.  Medicines to increase your blood pressure.  Kidney dialysis. This process cleans your blood if your kidneys have failed.  Surgery to remove infected tissue.  Blood transfusion if needed.  Medicine to prevent blood clots.  Nutrients to correct imbalances in basic body function (metabolism). You may: ? Receive important salts and minerals (electrolytes) through an IV. ? Have your blood sugar level adjusted. Follow these instructions at home: Medicines  Take over-the-counter  and prescription medicines only as told by your health care provider.  If you were prescribed an antibiotic, antiviral, or antifungal medicine, take it as told by your health care provider. Do not stop taking the medicine even if you start to feel better.   General instructions  If you have a  catheter or other indwelling device, ask to have it removed as soon as possible.  Keep all follow-up visits as told by your health care provider. This is important. Contact a health care provider if:  You do not feel like you are getting better or regaining strength.  You are having trouble coping with your recovery.  You frequently feel tired.  You feel worse or do not seem to get better after surgery.  You think you may have an infection after surgery. Get help right away if:  You have any symptoms of sepsis.  You have difficulty breathing.  You have a rapid or skipping heartbeat.  You become confused or disoriented.  You have a high fever.  Your skin becomes blotchy, pale, or blue.  You have an infection that is getting worse or not getting better. These symptoms may represent a serious problem that is an emergency. Do not wait to see if the symptoms will go away. Get medical help right away. Call your local emergency services (911 in the [place] Do not drive yourself to the hospital. Summary  Sepsis is a medical emergency that requires immediate treatment in a hospital.  This condition is caused by a severe reaction to infections from bacteria, viruses, or fungus.  Based on the cause of your infection, you may be given an antibiotic, antiviral, or antifungal medicine.  Treatment may also include IV fluids, breathing assistance, and kidney dialysis. This information is not intended to replace advice given to you by your health care provider. Make sure you discuss any questions you have with your health care provider. Document Revised: 03/31/2018 Document Reviewed: 03/31/2018 Elsevier Patient Education  [place].

## 2020-12-18 NOTE — Assessment & Plan Note (Signed)
Encouraged heart healthy diet, increase exercise, avoid trans fats, consider a krill oil cap daily 

## 2020-12-18 NOTE — Progress Notes (Signed)
Subjective:   By signing my name below, I, [name], attest that this documentation has been prepared under the direction and in the presence of Dr. Roma Schanz R. 12/18/2020     Patient ID: [name], female    DOB: [birth year], 65 y.o.   MRN: [id number]  Chief Complaint  Patient presents with  . Hospitalization Follow-up    12/07/2020: Sepsis- Discharged on Keflex- finished this yesterday. And Zofran- has only needed 1 so far.  -tlc,rma    HPI Patient is in today for an hospital visit follow up. She was admitted on 12/07/20 sepsis due to UTI and was discharged on 12/10/20. She instructed to take 4 days of IV medications followed by a 7 days medication to treat her sepsis. She is feeling much better today, however she still has left side abdominal pain(kidney area). She has also developed some urinary incontinence. She notes that she has a upcoming appointment with her nephrologist. She has not had any recent gout flare ups. She denies any urinary frequency or urgency, hematuria, blood in stool,  shortness of breath, nausea, vomiting, diarrhea, cough, fever, bilateral earpain, skin changes, sore throat at this time.   Past Medical History:  Diagnosis Date  . Abdominal pain    Hx  . Allergy   . Anal stenosis   . Anemia   . Anxiety   . Arthritis   . Asthma    patient does not have inhaler  . Blood in stool    Hx  . Blood in urine    Hx  . Blood transfusion without reported diagnosis   . Cataract   . CKD (chronic kidney disease) stage 3, GFR 30-59 ml/min (HCC)   . Crohn's colitis, other complication ([place])   . De Quervain's tenosynovitis   . Depression   . Difficulty urinating    Hx  . Easy bruising   . Esophagitis   . Fainting    History - resolved - due to dehydration  . Fatigue    Hx  . Fibroid    Hx  . Gastric polyp   . GERD (gastroesophageal reflux disease)   . 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 Hemorrhoids, external   .  Hemorrhoids, internal   . Herpes, genital    vaginal treated 07/05/12 and pt states is resolved  . History of cervical dysplasia   . History of small bowel obstruction   . Hyperlipidemia    currently no meds  . Hyperparathyroidism   . Hypertension   . Hypokalemia    Hx  . 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 Obesity   . Ovarian cyst   . Poor dental hygiene   . Pulmonary nodule, [name]    [name] upper lobe  . Rectal bleeding    Hx  . Rectal pain    Hx  . Renal insufficiency    CKD - stage 3  . RLS (restless legs syndrome)    no meds  . Tooth infection 11/2016   [name] low  . Ulcerative colitis   . Visual disturbance    wears glasses  . Weakness generalized    Hx - patient denies generalized weakness  . Wears dentures    upper only    Past Surgical History:  Procedure Laterality Date  . ANAL DILATION    .  CERVICAL BIOPSY  W/ LOOP ELECTRODE EXCISION    . CHOLECYSTECTOMY    . COLONOSCOPY     [name]  . fatty tumor removed from back     X 2  . HEMORRHOID SURGERY    . ILEOSTOMY CLOSURE    . RESTORATIVE PROCTOCOLECTOMY     with insertion of ileoanal J Pouch with loop ileostomy  . SHOULDER ARTHROSCOPY WITH CAPSULORRHAPHY Left 06/14/2019   Procedure: LEFT SHOULDER ARTHRSCOPIC REPAIR OF BONY BANKART FRACTURE;  Surgeon: Tania Ade, MD;  Location: [facility];  Service: Orthopedics;  Laterality: Left;  REQUEST 90 MINUTE  . SIGMOIDOSCOPY    . TOTAL ABDOMINAL HYSTERECTOMY  1998   TAH/LSO  . TUBAL LIGATION    . UPPER GASTROINTESTINAL ENDOSCOPY     [name]    Family History  Problem Relation Age of Onset  . Ulcerative colitis Father   . Hypertension Father   . Heart attack Father   . Hypertension Mother   . Heart disease Mother        s/p pci  . Ulcerative colitis Daughter   . Irritable bowel syndrome Other        grandchildren  . Diabetes Sister   . Cancer Sister         uterine  . Cancer Maternal Uncle        LUNG  . Colon cancer Neg Hx   . Esophageal cancer Neg Hx   . Stomach cancer Neg Hx   . Rectal cancer Neg Hx     Social History   Socioeconomic History  . Marital status: Divorced    Spouse name: Not on file  . Number of children: Not on file  . Years of education: Not on file  . Highest education level: Not on file  Occupational History  . Not on file  Tobacco Use  . Smoking status: Former Smoker    Packs/day: 1.00    Years: 4.00    Pack years: 4.00    Types: Cigarettes    Quit date: 05/21/1979    Years since quitting: 41.6  . Smokeless tobacco: Never Used  Vaping Use  . Vaping Use: Never used  Substance and Sexual Activity  . Alcohol use: No    Alcohol/week: 0.0 standard drinks  . Drug use: No  . Sexual activity: Yes    Birth control/protection: Post-menopausal    Comment: 1st intercourse 65 yo-Fewer than 5 partners  Other Topics Concern  . Not on file  Social History Narrative  . Not on file   Social Determinants of Health   Financial Resource Strain: Not on file  Food Insecurity: Not on file  Transportation Needs: Not on file  Physical Activity: Not on file  Stress: Not on file  Social Connections: Not on file  Intimate Partner Violence: Not on file    Outpatient Medications Prior to Visit  Medication Sig Dispense Refill  . acetaminophen (TYLENOL) 325 MG tablet Take 650 mg by mouth every 6 (six) hours as needed for mild pain, fever or headache.    . allopurinol (ZYLOPRIM) 300 MG tablet TAKE 1 TABLET BY MOUTH EVERY DAY (Patient taking differently: Take 300 mg by mouth daily.) 90 tablet 0  . cholecalciferol (VITAMIN D) 25 MCG (1000 UNIT) tablet Take 1,000 Units by mouth daily.    . DULoxetine (CYMBALTA) 60 MG capsule Take 2 capsules (120 mg total) by mouth daily. 180 capsule 1  . famotidine (PEPCID) 20 MG tablet Take 1 tablet (20 mg total) by mouth  daily. 30 tablet 0  . folic acid (FOLVITE) 1 MG tablet Take 2 tablets  (2 mg total) by mouth daily. (Patient taking differently: Take 1 mg by mouth daily.) 180 tablet 3  . gabapentin (NEURONTIN) 100 MG capsule TAKE 1 CAPSULE BY MOUTH THREE TIMES A DAY (Patient taking differently: Take 100 mg by mouth 3 (three) times daily.) 90 capsule 3  . metoprolol succinate (TOPROL-XL) 100 MG 24 hr tablet Take 50-100 mg by mouth See admin instructions. Take 100 mg in the a.m. and 50 mg in the evening    . ondansetron (ZOFRAN) 4 MG tablet Take 1 tablet (4 mg total) by mouth every 6 (six) hours as needed for nausea. 20 tablet 0  . vedolizumab (ENTYVIO) 300 MG injection Inject 300 mg into the vein every 30 (thirty) days.    . Vitamin D, Ergocalciferol, (DRISDOL) 1.25 MG (50000 UNIT) CAPS capsule Take 50,000 Units by mouth every 30 (thirty) days.     No facility-administered medications prior to visit.    Allergies  Allergen Reactions  . Sulfa Antibiotics Hives  . Morphine Other (See Comments)    Gives me crazy dreams Gives me crazy dreams  . Sulfonamide Derivatives Itching, Swelling and Rash    Review of Systems  Constitutional: Negative for chills and fever.  HENT: Negative for congestion, ear pain, sinus pain and sore throat.   Eyes: Negative for pain.  Respiratory: Negative for cough, shortness of breath and wheezing.   Cardiovascular: Negative for chest pain, palpitations and leg swelling.  Gastrointestinal: Negative for abdominal pain, blood in stool, diarrhea, nausea and vomiting.  Genitourinary: Positive for flank pain (left ). Negative for dysuria, frequency, hematuria and urgency.       (+)Urinary incontinence   Neurological: Negative for dizziness and headaches.       Objective:    Physical Exam Constitutional:      General: She is not in acute distress.    Appearance: Normal appearance. She is not ill-appearing.  HENT:     Head: Normocephalic and atraumatic.     [name] Ear: External ear normal.     Left Ear: External ear normal.  Eyes:      Extraocular Movements: Extraocular movements intact.     Pupils: Pupils are equal, round, and reactive to light.  Cardiovascular:     Rate and Rhythm: Normal rate and regular rhythm.     Pulses: Normal pulses.     Heart sounds: Normal heart sounds. No murmur heard.   Pulmonary:     Effort: Pulmonary effort is normal. No respiratory distress.     Breath sounds: Normal breath sounds. No wheezing, rhonchi or rales.  Skin:    General: Skin is warm and dry.  Neurological:     Mental Status: She is alert and oriented to person, place, and time.  Psychiatric:        Behavior: Behavior normal.     BP (!) 148/88 (BP Location: Left Arm, Patient Position: Sitting, Cuff Size: Normal)   Pulse 84   Temp 98.4 F (36.9 C) (Oral)   Resp 18   Ht 5' 3"  (1.6 m)   Wt 195 lb 9.6 oz (88.7 kg)   SpO2 97%   BMI 34.65 kg/m  Wt Readings from Last 3 Encounters:  12/18/20 195 lb 9.6 oz (88.7 kg)  12/08/20 194 lb 3.6 oz (88.1 kg)  11/19/20 200 lb (90.7 kg)    Diabetic Foot Exam - Simple   No data filed  Lab Results  Component Value Date   WBC 7.5 12/10/2020   HGB 9.8 (L) 12/10/2020   HCT 30.5 (L) 12/10/2020   PLT 310 12/10/2020   GLUCOSE 174 (H) 12/10/2020   CHOL 190 02/26/2020   TRIG 152.0 (H) 02/26/2020   HDL 43.50 02/26/2020   LDLDIRECT 108.0 12/17/2019   LDLCALC 116 (H) 02/26/2020   ALT 15 12/10/2020   AST 16 12/10/2020   NA 135 12/10/2020   K 4.3 12/10/2020   CL 99 12/10/2020   CREATININE 2.13 (H) 12/10/2020   BUN 25 (H) 12/10/2020   CO2 27 12/10/2020   TSH 4.18 02/26/2020   INR 1.0 12/07/2020   HGBA1C 5.6 08/23/2020    Lab Results  Component Value Date   TSH 4.18 02/26/2020   Lab Results  Component Value Date   WBC 7.5 12/10/2020   HGB 9.8 (L) 12/10/2020   HCT 30.5 (L) 12/10/2020   MCV 104.1 (H) 12/10/2020   PLT 310 12/10/2020   Lab Results  Component Value Date   NA 135 12/10/2020   K 4.3 12/10/2020   CO2 27 12/10/2020   GLUCOSE 174 (H) 12/10/2020   BUN  25 (H) 12/10/2020   CREATININE 2.13 (H) 12/10/2020   BILITOT 0.3 12/10/2020   ALKPHOS 83 12/10/2020   AST 16 12/10/2020   ALT 15 12/10/2020   PROT 7.1 12/10/2020   ALBUMIN 2.9 (L) 12/10/2020   CALCIUM 9.0 12/10/2020   ANIONGAP 9 12/10/2020   GFR 23.50 (L) 10/23/2020   Lab Results  Component Value Date   CHOL 190 02/26/2020   Lab Results  Component Value Date   HDL 43.50 02/26/2020   Lab Results  Component Value Date   LDLCALC 116 (H) 02/26/2020   Lab Results  Component Value Date   TRIG 152.0 (H) 02/26/2020   Lab Results  Component Value Date   CHOLHDL 4 02/26/2020   Lab Results  Component Value Date   HGBA1C 5.6 08/23/2020       Assessment & Plan:   Problem List Items Addressed This Visit      Unprioritized   CKD (chronic kidney disease), stage IV ([place])    Per nephrology      Relevant Orders   AMB Referral to [facility]   Depression, major, single episode, severe ([place])   Relevant Orders   AMB Referral to [facility]ordin[facility]   Disorder resulting from impaired renal function    Per nephrology      Relevant Orders   AMB Referral to [place]   Essential hypertension    Well controlled, no changes to meds. Encouraged heart healthy diet such as the DASH diet and exercise as tolerated.       Relevant Orders   AMB Referral to [facility]ordin[facility]   Gout   Relevant Orders   Uric acid   AMB Referral to [facility]ordin[facility]   Hyperlipidemia associated with type 2 diabetes mellitus ([place])   Relevant Orders   AMB Referral to [facility]ordin[facility]   Hyperlipidemia LDL goal <100    Encouraged heart healthy diet, increase exercise, avoid trans fats, consider a krill oil cap daily      Relevant Orders   AMB Referral to [place]   Immunologic deficiency syndrome ([place])   Relevant Orders   AMB Referral to [place]   Morbid obesity ([place])   Relevant Orders   AMB  Referral to Community Care Coordin[facility]   Prothrombin gene mutation (  [place])   Relevant Orders   AMB Referral to [facility] Coordin[facility]   Severe sepsis ([place]) - Primary   Relevant Orders   CBC with Differential/Platelet   Comprehensive metabolic panel   Magnesium   POCT Urinalysis Dipstick (Automated) (Completed)   Phosphorus   AMB Referral to [facility]    Other Visit Diagnoses    Primary hypertension       Relevant Orders   AMB Referral to [facility] Coordin[facility]   Parathyroid hormone excess (HCC)   (Chronic)     Relevant Orders   AMB Referral to [facility] Coordin[facility]       No orders of the defined types were placed in this encounter.   IAnn Held, DO, personally preformed the services described in this documentation.  All medical record entries made by the scribe were at my direction and in my presence.  I have reviewed the chart and discharge instructions (if applicable) and agree that the record reflects my personal performance and is accurate and complete. 12/18/20  I,[name],acting as a scribe for Ann Held, DO.,have documented all relevant documentation on the behalf of Ann Held, DO,as directed by  Ann Held, DO while in the presence of [place], DO, have reviewed all documentation for this visit. The documentation on 12/18/20 for the exam, diagnosis, procedures, and orders are all accurate and complete.   Ann Held, DO

## 2020-12-22 ENCOUNTER — Other Ambulatory Visit: Payer: Self-pay | Admitting: Family Medicine

## 2020-12-24 ENCOUNTER — Telehealth: Payer: Self-pay

## 2020-12-24 NOTE — Chronic Care Management (AMB) (Signed)
  Chronic Care Management   Note  12/24/2020 Name: Marvie Brevik MRN: [id number] DOB: July 16, 1956  Audrea Muscat DAYNA ALIA is a 65 y.o. year old female who is a primary care patient of Ann Held, DO. I reached out to [name] by phone today in response to a referral sent by Ms. Audrea Muscat H Jeancharles's PCP, Ann Held, DO     Ms. Bones was given information about Chronic Care Management services today including:  1. CCM service includes personalized support from designated clinical staff supervised by her physician, including individualized plan of care and coordination with other care providers 2. 24/7 contact phone numbers for assistance for urgent and routine care needs. 3. Service will only be billed when office clinical staff spend 20 minutes or more in a month to coordinate care. 4. Only one practitioner may furnish and bill the service in a calendar month. 5. The patient may stop CCM services at any time (effective at the end of the month) by phone call to the office staff. 6. The patient will be responsible for cost sharing (co-pay) of up to 20% of the service fee (after annual deductible is met).  Patient did not agree to enrollment in care management services and does not wish to consider at this time.  Follow up plan: Patient declines engagement by the care management team. Appropriate care team members and provider have been notified via electronic communication.   Noreene Larsson, [place], [place], [place] [address] Direct Dial: [phone] [email or web address]: [email or web address]

## 2021-01-01 ENCOUNTER — Telehealth: Payer: Self-pay

## 2021-01-01 DIAGNOSIS — K5100 Ulcerative (chronic) pancolitis without complications: Secondary | ICD-10-CM | POA: Diagnosis not present

## 2021-01-01 NOTE — Telephone Encounter (Signed)
Plan of treatment form faxed back to [facility] at [phone].

## 2021-01-01 NOTE — Telephone Encounter (Signed)
Thanks, I got it and signed it, in my outbox. Thank you

## 2021-01-01 NOTE — Telephone Encounter (Signed)
Received a fax from [facility] in regards to patient's Entyvio orders. Orders placed in Dr. Doyne Keel IN box for review. Will fax form after completion. Thanks

## 2021-01-07 ENCOUNTER — Telehealth: Payer: Self-pay | Admitting: Gastroenterology

## 2021-01-07 NOTE — Telephone Encounter (Signed)
Inbound call from patient asking for a call back to discuss surgery for removal of J-[phone]

## 2021-01-08 NOTE — Telephone Encounter (Signed)
Spoke with patient, she states that she has finally moved and got everything situated and is ready to proceed with J-pouch surgery. Patient would like to see Dr. Havery Moros to further discuss. Patient also thinks that she may have a fistula or tear. She states that in the mornings she does fine but as the day goes on her stools get looser and makes her rectum feel raw and burning. Advised patient that it may be best for her to come in for an evaluation of the area. Patient has been scheduled to see Dr. Havery Moros on Thursday, 01/22/21 at 8:10 AM. Patient was offered a sooner appt with an APP but declined. Patient had no further concerns at the end of the call.

## 2021-01-13 NOTE — Telephone Encounter (Signed)
Patient called requested to cancel the OV with Dr. Havery Moros because she said they have already spoken about the J pouch procedure and she would like to be sent to the surgeon is ready to have the procedure.Marland Kitchen

## 2021-01-13 NOTE — Telephone Encounter (Signed)
Dr. Havery Moros, please see note below. Thanks

## 2021-01-14 NOTE — Telephone Encounter (Signed)
Spoke with patient, she has decided to see Dr. Dema Severin at [facility].  Referral, records, demographic and insurance information faxed to Dr. Dema Severin at [facility].

## 2021-01-14 NOTE — Telephone Encounter (Signed)
Okay thanks for letting me know. I had discussed her case at the multi-D IBD conference with [facility]. They are happy to see her if she is interested in seeing them, vs. Dr. Drue Flirt at [facility]. She has seen [facility] and does not want to have it done there. If she wishes to see [facility] can you please refer her to [facility], Dr. Dema Severin, for end ileostomy. Thanks

## 2021-01-14 NOTE — Telephone Encounter (Signed)
Can you let me know what she decides so I can speak with the surgeon directly about her before her visit, as she is quite complicated. Thanks

## 2021-01-14 NOTE — Telephone Encounter (Signed)
Noted, thank you

## 2021-01-14 NOTE — Telephone Encounter (Signed)
Okay thanks I will reach out to him

## 2021-01-22 ENCOUNTER — Ambulatory Visit: Payer: Medicare Other | Admitting: Gastroenterology

## 2021-01-23 DIAGNOSIS — K5100 Ulcerative (chronic) pancolitis without complications: Secondary | ICD-10-CM | POA: Diagnosis not present

## 2021-01-26 DIAGNOSIS — K5100 Ulcerative (chronic) pancolitis without complications: Secondary | ICD-10-CM | POA: Diagnosis not present

## 2021-01-27 DIAGNOSIS — N184 Chronic kidney disease, stage 4 (severe): Secondary | ICD-10-CM | POA: Diagnosis not present

## 2021-01-27 DIAGNOSIS — M109 Gout, unspecified: Secondary | ICD-10-CM | POA: Diagnosis not present

## 2021-01-27 DIAGNOSIS — I129 Hypertensive chronic kidney disease with stage 1 through stage 4 chronic kidney disease, or unspecified chronic kidney disease: Secondary | ICD-10-CM | POA: Diagnosis not present

## 2021-01-27 DIAGNOSIS — N2581 Secondary hyperparathyroidism of renal origin: Secondary | ICD-10-CM | POA: Diagnosis not present

## 2021-01-27 DIAGNOSIS — R768 Other specified abnormal immunological findings in serum: Secondary | ICD-10-CM | POA: Diagnosis not present

## 2021-01-27 DIAGNOSIS — D509 Iron deficiency anemia, unspecified: Secondary | ICD-10-CM | POA: Diagnosis not present

## 2021-01-27 DIAGNOSIS — K91850 Pouchitis: Secondary | ICD-10-CM | POA: Diagnosis not present

## 2021-01-27 DIAGNOSIS — N390 Urinary tract infection, site not specified: Secondary | ICD-10-CM | POA: Diagnosis not present

## 2021-01-27 DIAGNOSIS — D631 Anemia in chronic kidney disease: Secondary | ICD-10-CM | POA: Diagnosis not present

## 2021-01-27 DIAGNOSIS — E876 Hypokalemia: Secondary | ICD-10-CM | POA: Diagnosis not present

## 2021-01-27 DIAGNOSIS — N179 Acute kidney failure, unspecified: Secondary | ICD-10-CM | POA: Diagnosis not present

## 2021-01-29 DIAGNOSIS — H25813 Combined forms of age-related cataract, bilateral: Secondary | ICD-10-CM | POA: Diagnosis not present

## 2021-01-29 DIAGNOSIS — H524 Presbyopia: Secondary | ICD-10-CM | POA: Diagnosis not present

## 2021-01-29 DIAGNOSIS — H5203 Hypermetropia, bilateral: Secondary | ICD-10-CM | POA: Diagnosis not present

## 2021-02-12 ENCOUNTER — Other Ambulatory Visit: Payer: Self-pay | Admitting: Family Medicine

## 2021-02-12 DIAGNOSIS — F418 Other specified anxiety disorders: Secondary | ICD-10-CM

## 2021-02-12 DIAGNOSIS — M79671 Pain in right foot: Secondary | ICD-10-CM

## 2021-02-19 DIAGNOSIS — K5100 Ulcerative (chronic) pancolitis without complications: Secondary | ICD-10-CM | POA: Diagnosis not present

## 2021-02-23 DIAGNOSIS — K5100 Ulcerative (chronic) pancolitis without complications: Secondary | ICD-10-CM | POA: Diagnosis not present

## 2021-03-02 NOTE — Telephone Encounter (Signed)
Inbound call from patient requesting a call from a nurse please stating they want referral now sent to [facility].  Please advise.

## 2021-03-03 ENCOUNTER — Other Ambulatory Visit: Payer: Self-pay | Admitting: Family Medicine

## 2021-03-03 NOTE — Telephone Encounter (Signed)
Sorry to hear this, must have been some miscommunication about her time. Wherever she would like to be seen is fine with me. Can refer to Dr. Drue Flirt for this, but not sure how long of a wait she may have there. Thanks

## 2021-03-03 NOTE — Telephone Encounter (Signed)
Spoke with patient, she states that she was scheduled to see Dr. Dema Severin yesterday, she arrived at the time that she thought her appt was scheduled but she was told she was an hour late. They offered to reschedule her appointment but patient declined. Patient would now like referral sent to Dr. Drue Flirt at [facility].

## 2021-03-03 NOTE — Telephone Encounter (Signed)
Referral, records, demographic, and insurance information faxed to Dr. Renelda Mom at [facility].

## 2021-03-10 ENCOUNTER — Ambulatory Visit (INDEPENDENT_AMBULATORY_CARE_PROVIDER_SITE_OTHER): Payer: Medicare Other | Admitting: Family Medicine

## 2021-03-10 ENCOUNTER — Other Ambulatory Visit: Payer: Self-pay

## 2021-03-10 ENCOUNTER — Ambulatory Visit (HOSPITAL_BASED_OUTPATIENT_CLINIC_OR_DEPARTMENT_OTHER)
Admission: RE | Admit: 2021-03-10 | Discharge: 2021-03-10 | Disposition: A | Discharge status: Home / Self Care | Payer: Medicare Other | Source: Ambulatory Visit | Attending: Family Medicine | Admitting: Family Medicine

## 2021-03-10 ENCOUNTER — Encounter: Payer: Self-pay | Admitting: Family Medicine

## 2021-03-10 VITALS — BP 130/88 | HR 95 | Temp 98.0°F | Resp 18 | Ht 63.0 in | Wt 202.0 lb

## 2021-03-10 DIAGNOSIS — M25562 Pain in left knee: Secondary | ICD-10-CM | POA: Insufficient documentation

## 2021-03-10 DIAGNOSIS — G8929 Other chronic pain: Secondary | ICD-10-CM | POA: Diagnosis not present

## 2021-03-10 DIAGNOSIS — G629 Polyneuropathy, unspecified: Secondary | ICD-10-CM

## 2021-03-10 IMAGING — DX DG KNEE COMPLETE 4+V*L*
4 series · 4 of 4 positions shown · non-contrast
Comparison: [DATE].

CLINICAL DATA: Chronic left knee pain.

EXAM:
LEFT KNEE - COMPLETE 4+ VIEW

[knee ap]
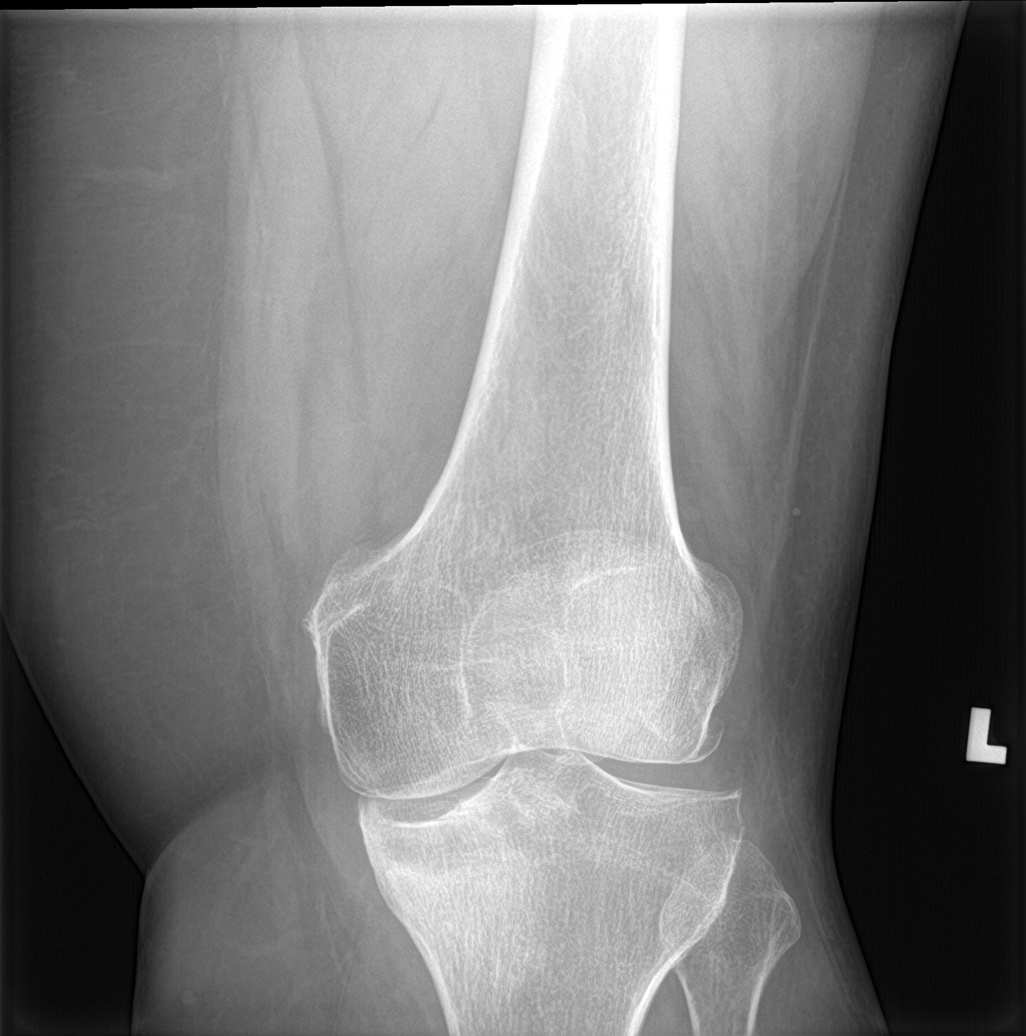

[knee lat]
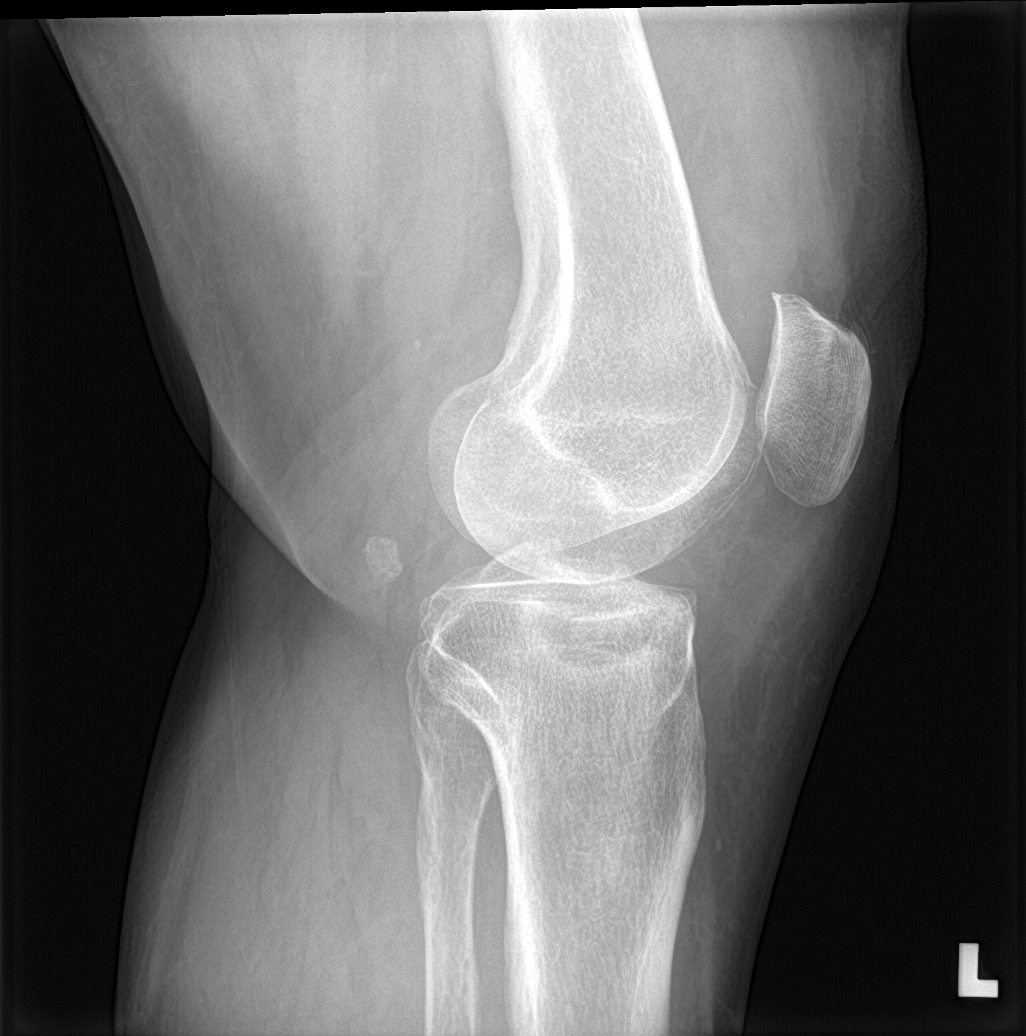

[knee obl (1 of 2)]
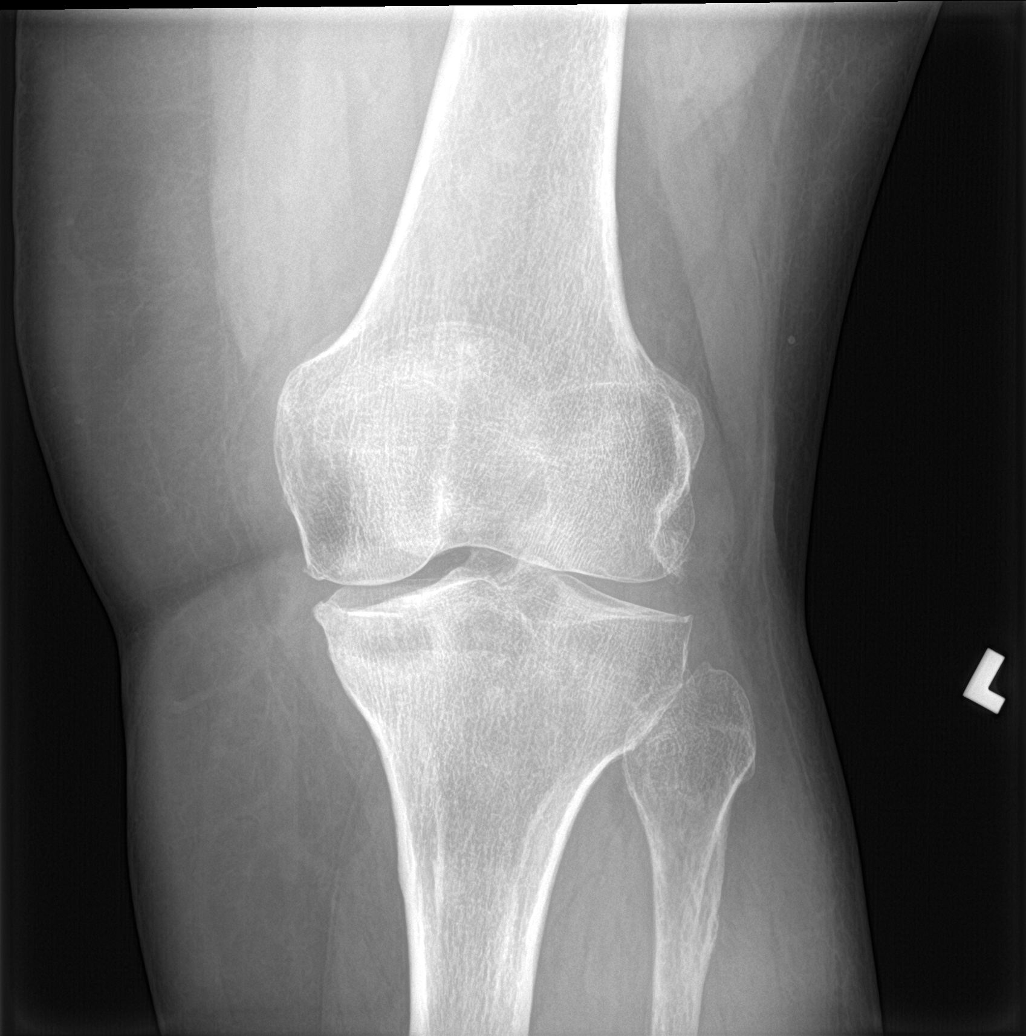

[knee obl (2 of 2)]
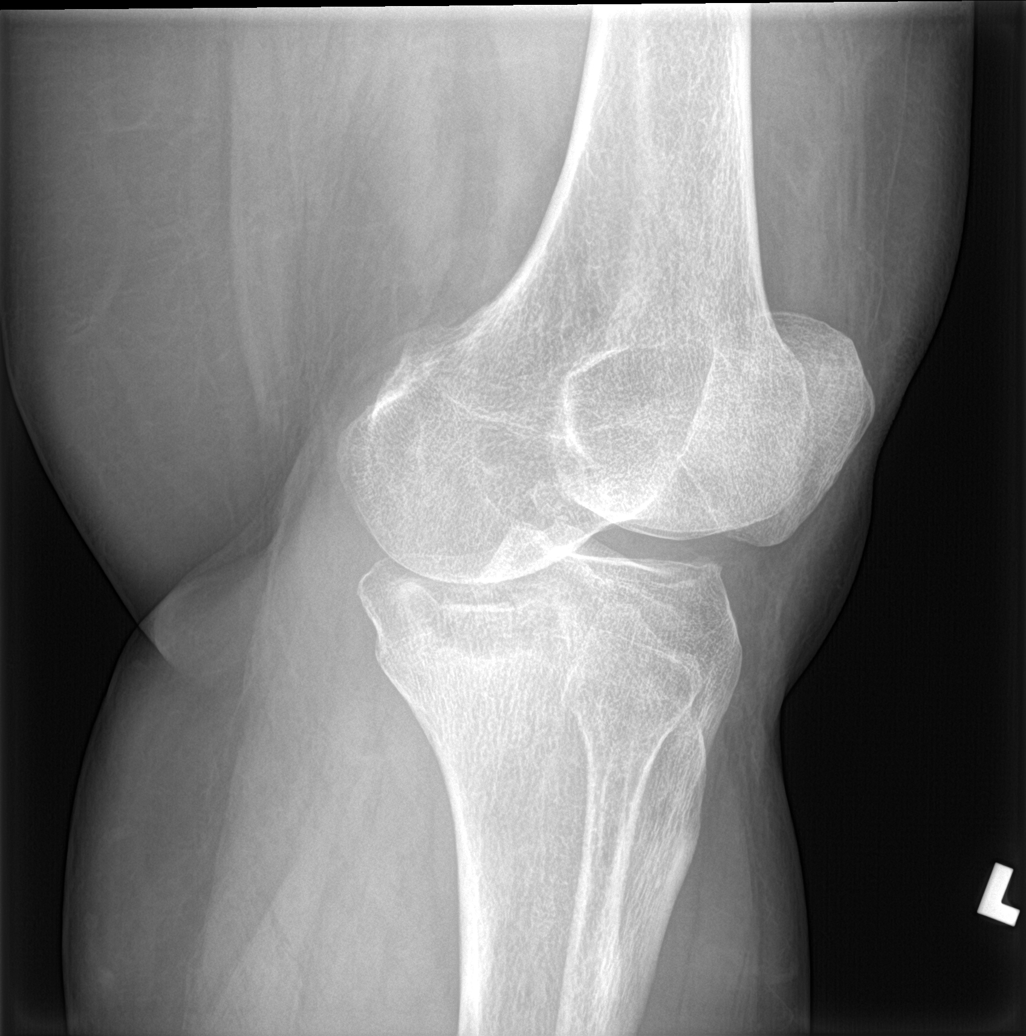

[4 of 4 positions shown; findings below may reference images not displayed]

FINDINGS: No evidence of fracture, dislocation, or joint effusion. Moderate
narrowing of medial joint space is noted. Soft tissues are
unremarkable.
IMPRESSION: Moderate degenerative joint disease.  No acute abnormality seen.

## 2021-03-10 MED ORDER — NONFORMULARY OR COMPOUNDED ITEM
1 refills | Status: DC
Start: 2021-03-10 — End: 2021-05-20

## 2021-03-10 NOTE — Patient Instructions (Signed)
Acute Knee Pain, Adult Acute knee pain is sudden and may be caused by damage, swelling, or irritation of the muscles and tissues that support the knee. Pain may result from: A fall. An injury to the knee from twisting motions. A hit to the knee. Infection. Acute knee pain may go away on its own with time and rest. If it does not, your health care provider may order tests to find the cause of the pain. These may include: Imaging tests, such as an X-ray, MRI, CT scan, or ultrasound. Joint aspiration. In this test, fluid is removed from the knee and evaluated. Arthroscopy. In this test, a lighted tube is inserted into the knee and an image is projected onto a TV screen. Biopsy. In this test, a sample of tissue is removed from the body and studied under a microscope. Follow these instructions at home: If you have a knee sleeve or brace:  Wear the knee sleeve or brace as told by your health care provider. Remove it only as told by your health care provider. Loosen it if your toes tingle, become numb, or turn cold and blue. Keep it clean. If the knee sleeve or brace is not waterproof: Do not let it get wet. Cover it with a watertight covering when you take a bath or shower.  Activity Rest your knee. Do not do things that cause pain or make pain worse. Avoid high-impact activities or exercises, such as running, jumping rope, or doing jumping jacks. Work with a physical therapist to make a safe exercise program, as recommended by your health care provider. Do exercises as told by your physical therapist. Managing pain, stiffness, and swelling  If directed, put ice on the affected knee. To do this: If you have a removable knee sleeve or brace, remove it as told by your health care provider. Put ice in a plastic bag. Place a towel between your skin and the bag. Leave the ice on for 20 minutes, 2-3 times a day. Remove the ice if your skin turns bright red. This is very important. If you cannot  feel pain, heat, or cold, you have a greater risk of damage to the area. If directed, use an elastic bandage to put pressure (compression) on your injured knee. This may control swelling, give support, and help with discomfort. Raise (elevate) your knee above the level of your heart while you are sitting or lying down. Sleep with a pillow under your knee.  General instructions Take over-the-counter and prescription medicines only as told by your health care provider. Do not use any products that contain nicotine or tobacco, such as cigarettes, e-cigarettes, and chewing tobacco. If you need help quitting, ask your health care provider. If you are overweight, work with your health care provider and a dietitian to set a weight-loss goal that is healthy and reasonable for you. Extra weight can put pressure on your knee. Pay attention to any changes in your symptoms. Keep all follow-up visits. This is important. Contact a health care provider if: Your knee pain continues, changes, or gets worse. You have a fever along with knee pain. Your knee feels warm to the touch or is red. Your knee buckles or locks up. Get help right away if: Your knee swells, and the swelling becomes worse. You cannot move your knee. You have severe pain in your knee that cannot be managed with pain medicine. Summary Acute knee pain can be caused by a fall, an injury, an infection, or damage, swelling,  or irritation of the tissues that support your knee. Your health care provider may perform tests to find out the cause of the pain. Pay attention to any changes in your symptoms. Relieve your pain with rest, medicines, light activity, and the use of ice. Get help right away if your knee swells, you cannot move your knee, or you have severe pain that cannot be managed with medicine. This information is not intended to replace advice given to you by your health care provider. Make sure you discuss any questions you have with  your healthcare provider. Document Revised: 02/06/2020 Document Reviewed: 02/06/2020 Elsevier Patient Education  2022 [employer].

## 2021-03-10 NOTE — Progress Notes (Signed)
Established Patient Office Visit  Subjective:  Patient ID: [name], female    DOB: 1956/05/14  Age: 65 y.o. MRN: [id number]  CC:  Chief Complaint  Patient presents with   Knee Pain    Left pain, pain has continued since March.     HPI [name] presents for L knee pain --  since she ran into a cinder block in March.  She saw sport med and was told she fractured her patella------ it never improved   She is requesting an xray   Past Medical History:  Diagnosis Date   Abdominal pain    Hx   Allergy    Anal stenosis    Anemia    Anxiety    Arthritis    Asthma    patient does not have inhaler   Blood in stool    Hx   Blood in urine    Hx   Blood transfusion without reported diagnosis    Cataract    CKD (chronic kidney disease) stage 3, GFR 30-59 ml/min (HCC)    Crohn's colitis, other complication (HCC)    De Quervain's tenosynovitis    Depression    Difficulty urinating    Hx   Easy bruising    Esophagitis    Fainting    History - resolved - due to dehydration   Fatigue    Hx   Fibroid    Hx   Gastric polyp    GERD (gastroesophageal reflux disease)    Hearing loss    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   Hemorrhoids, external    Hemorrhoids, internal    Herpes, genital    vaginal treated 07/05/12 and pt states is resolved   History of cervical dysplasia    History of small bowel obstruction    Hyperlipidemia    currently no meds   Hyperparathyroidism    Hypertension    Hypokalemia    Hx   IBD (inflammatory bowel disease)    initially colectomy for suspected UC, 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   Obesity    Ovarian cyst    Poor dental hygiene    Pulmonary nodule, right    right upper lobe   Rectal bleeding    Hx   Rectal pain    Hx   Renal insufficiency    CKD - stage 3   RLS (restless legs syndrome)    no meds   Tooth infection 11/2016   right low   Ulcerative colitis    Visual disturbance    wears  glasses   Weakness generalized    Hx - patient denies generalized weakness   Wears dentures    upper only    Past Surgical History:  Procedure Laterality Date   ANAL DILATION     CERVICAL BIOPSY  W/ LOOP ELECTRODE EXCISION     CHOLECYSTECTOMY     COLONOSCOPY     [name]   fatty tumor removed from back     X 2   HEMORRHOID SURGERY     ILEOSTOMY CLOSURE     RESTORATIVE PROCTOCOLECTOMY     with insertion of ileoanal J Pouch with loop ileostomy   SHOULDER ARTHROSCOPY WITH CAPSULORRHAPHY Left 06/14/2019   Procedure: LEFT SHOULDER ARTHRSCOPIC REPAIR OF BONY BANKART FRACTURE;  Surgeon: Tania Ade, MD;  Location: [facility];  Service: Orthopedics;  Laterality: Left;  REQUEST 90 MINUTE   SIGMOIDOSCOPY  TOTAL ABDOMINAL HYSTERECTOMY  1998   TAH/LSO   TUBAL LIGATION     UPPER GASTROINTESTINAL ENDOSCOPY     [name]    Family History  Problem Relation Age of Onset   Ulcerative colitis Father    Hypertension Father    Heart attack Father    Hypertension Mother    Heart disease Mother        s/p pci   Ulcerative colitis Daughter    Irritable bowel syndrome Other        grandchildren   Diabetes Sister    Cancer Sister        uterine   Cancer Maternal Uncle        LUNG   Colon cancer Neg Hx    Esophageal cancer Neg Hx    Stomach cancer Neg Hx    Rectal cancer Neg Hx     Social History   Socioeconomic History   Marital status: Divorced    Spouse name: Not on file   Number of children: Not on file   Years of education: Not on file   Highest education level: Not on file  Occupational History   Not on file  Tobacco Use   Smoking status: Former    Packs/day: 1.00    Years: 4.00    Pack years: 4.00    Types: Cigarettes    Quit date: 05/21/1979    Years since quitting: 41.8   Smokeless tobacco: Never  Vaping Use   Vaping Use: Never used  Substance and Sexual Activity   Alcohol use: No    Alcohol/week: 0.0 standard drinks   Drug use: No   Sexual activity: Yes     Birth control/protection: Post-menopausal    Comment: 1st intercourse 65 yo-Fewer than 5 partners  Other Topics Concern   Not on file  Social History Narrative   Not on file   Social Determinants of Health   Financial Resource Strain: Not on file  Food Insecurity: Not on file  Transportation Needs: Not on file  Physical Activity: Not on file  Stress: Not on file  Social Connections: Not on file  Intimate Partner Violence: Not on file    Outpatient Medications Prior to Visit  Medication Sig Dispense Refill   acetaminophen (TYLENOL) 325 MG tablet Take 650 mg by mouth every 6 (six) hours as needed for mild pain, fever or headache.     allopurinol (ZYLOPRIM) 300 MG tablet TAKE 1 TABLET BY MOUTH EVERY DAY 90 tablet 0   cholecalciferol (VITAMIN D) 25 MCG (1000 UNIT) tablet Take 1,000 Units by mouth daily.     DULoxetine (CYMBALTA) 60 MG capsule Take 2 capsules (120 mg total) by mouth daily. 180 capsule 1   famotidine (PEPCID) 20 MG tablet Take 1 tablet (20 mg total) by mouth daily. 30 tablet 0   folic acid (FOLVITE) 1 MG tablet Take 2 tablets (2 mg total) by mouth daily. (Patient taking differently: Take 1 mg by mouth daily.) 180 tablet 3   gabapentin (NEURONTIN) 100 MG capsule TAKE 1 CAPSULE BY MOUTH THREE TIMES A DAY 90 capsule 2   metoprolol succinate (TOPROL-XL) 100 MG 24 hr tablet Take 50-100 mg by mouth See admin instructions. Take 100 mg in the a.m. and 50 mg in the evening     vedolizumab (ENTYVIO) 300 MG injection Inject 300 mg into the vein every 30 (thirty) days.     Vitamin D, Ergocalciferol, (DRISDOL) 1.25 MG (50000 UNIT) CAPS capsule Take 50,000 Units by mouth  every 30 (thirty) days.     ondansetron (ZOFRAN) 4 MG tablet Take 1 tablet (4 mg total) by mouth every 6 (six) hours as needed for nausea. (Patient not taking: Reported on 03/10/2021) 20 tablet 0   No facility-administered medications prior to visit.    Allergies  Allergen Reactions   Sulfa Antibiotics Hives    Morphine Other (See Comments)    Gives me crazy dreams Gives me crazy dreams   Sulfonamide Derivatives Itching, Swelling and Rash    ROS Review of Systems  Constitutional:  Negative for appetite change, diaphoresis, fatigue and unexpected weight change.  Eyes:  Negative for pain, redness and visual disturbance.  Respiratory:  Negative for cough, chest tightness, shortness of breath and wheezing.   Cardiovascular:  Negative for chest pain, palpitations and leg swelling.  Endocrine: Negative for cold intolerance, heat intolerance, polydipsia, polyphagia and polyuria.  Genitourinary:  Negative for difficulty urinating, dysuria and frequency.  Musculoskeletal:  Positive for joint swelling.  Neurological:  Negative for dizziness, light-headedness, numbness and headaches.     Objective:    Physical Exam Vitals and nursing note reviewed.  Constitutional:      Appearance: She is well-developed.  HENT:     Head: Normocephalic and atraumatic.  Eyes:     Conjunctiva/sclera: Conjunctivae normal.  Neck:     Thyroid: No thyromegaly.     Vascular: No carotid bruit or JVD.  Cardiovascular:     Rate and Rhythm: Normal rate and regular rhythm.     Heart sounds: Normal heart sounds. No murmur heard. Pulmonary:     Effort: Pulmonary effort is normal. No respiratory distress.     Breath sounds: Normal breath sounds. No wheezing or rales.  Chest:     Chest wall: No tenderness.  Musculoskeletal:        General: Swelling and tenderness present.     Cervical back: Normal range of motion and neck supple.     Left knee: Swelling and bony tenderness present. No crepitus. Decreased range of motion. Tenderness present over the medial joint line.  Neurological:     Mental Status: She is alert and oriented to person, place, and time.   BP 130/88 (BP Location: Right Arm, Patient Position: Sitting, Cuff Size: Normal)   Pulse 95   Temp 98 F (36.7 C) (Oral)   Resp 18   Ht 5' 3"  (1.6 m)   Wt 202 lb  (91.6 kg)   SpO2 97%   BMI 35.78 kg/m  Wt Readings from Last 3 Encounters:  03/10/21 202 lb (91.6 kg)  12/18/20 195 lb 9.6 oz (88.7 kg)  12/08/20 194 lb 3.6 oz (88.1 kg)     Health Maintenance Due  Topic Date Due   COVID-19 Vaccine (1) Never done   FOOT EXAM  Never done   OPHTHALMOLOGY EXAM  Never done   URINE MICROALBUMIN  Never done   Zoster Vaccines- Shingrix (1 of 2) Never done   COLONOSCOPY (Pts 45-91yr Insurance coverage will need to be confirmed)  06/02/2016   MAMMOGRAM  10/13/2019   PNA vac Low Risk Adult (2 of 2 - PPSV23) 06/03/2020   HEMOGLOBIN A1C  02/21/2021    There are no preventive care reminders to display for this patient.  Lab Results  Component Value Date   TSH 4.18 02/26/2020   Lab Results  Component Value Date   WBC 10.1 12/18/2020   HGB 10.6 (L) 12/18/2020   HCT 32.5 (L) 12/18/2020   MCV 100.0 12/18/2020  PLT 522.0 (H) 12/18/2020   Lab Results  Component Value Date   NA 137 12/18/2020   K 4.9 12/18/2020   CO2 25 12/18/2020   GLUCOSE 93 12/18/2020   BUN 35 (H) 12/18/2020   CREATININE 2.24 (H) 12/18/2020   BILITOT 0.3 12/18/2020   ALKPHOS 113 12/18/2020   AST 13 12/18/2020   ALT 12 12/18/2020   PROT 7.6 12/18/2020   ALBUMIN 3.8 12/18/2020   CALCIUM 9.8 12/18/2020   ANIONGAP 9 12/10/2020   GFR 22.23 (L) 12/18/2020   Lab Results  Component Value Date   CHOL 190 02/26/2020   Lab Results  Component Value Date   HDL 43.50 02/26/2020   Lab Results  Component Value Date   LDLCALC 116 (H) 02/26/2020   Lab Results  Component Value Date   TRIG 152.0 (H) 02/26/2020   Lab Results  Component Value Date   CHOLHDL 4 02/26/2020   Lab Results  Component Value Date   HGBA1C 5.6 08/23/2020      Assessment & Plan:   Problem List Items Addressed This Visit       Unprioritized   Chronic pain of left knee - Primary    voltaren gel  Check xray today Refer ortho        Relevant Orders   DG Knee Complete 4 Views Left    Ambulatory referral to Orthopedic Surgery   Other Visit Diagnoses     Neuropathy       Relevant Medications   NONFORMULARY OR COMPOUNDED ITEM       Meds ordered this encounter  Medications   NONFORMULARY OR COMPOUNDED ITEM    Sig: NerveE for neuropathy    Dispense:  30 each    Refill:  1    Follow-up: Return if symptoms worsen or fail to improve.    Ann Held, DO

## 2021-03-10 NOTE — Assessment & Plan Note (Signed)
voltaren gel  Check xray today Refer ortho

## 2021-03-19 DIAGNOSIS — K5100 Ulcerative (chronic) pancolitis without complications: Secondary | ICD-10-CM | POA: Diagnosis not present

## 2021-04-01 ENCOUNTER — Other Ambulatory Visit: Payer: Self-pay | Admitting: Gastroenterology

## 2021-04-21 DIAGNOSIS — K5000 Crohn's disease of small intestine without complications: Secondary | ICD-10-CM | POA: Diagnosis not present

## 2021-04-21 DIAGNOSIS — Z882 Allergy status to sulfonamides status: Secondary | ICD-10-CM | POA: Diagnosis not present

## 2021-04-21 DIAGNOSIS — Z87891 Personal history of nicotine dependence: Secondary | ICD-10-CM | POA: Diagnosis not present

## 2021-04-21 DIAGNOSIS — Z90710 Acquired absence of both cervix and uterus: Secondary | ICD-10-CM | POA: Diagnosis not present

## 2021-04-21 DIAGNOSIS — Z888 Allergy status to other drugs, medicaments and biological substances status: Secondary | ICD-10-CM | POA: Diagnosis not present

## 2021-04-21 DIAGNOSIS — Z833 Family history of diabetes mellitus: Secondary | ICD-10-CM | POA: Diagnosis not present

## 2021-04-21 DIAGNOSIS — K660 Peritoneal adhesions (postprocedural) (postinfection): Secondary | ICD-10-CM | POA: Diagnosis not present

## 2021-04-21 DIAGNOSIS — K6389 Other specified diseases of intestine: Secondary | ICD-10-CM | POA: Diagnosis not present

## 2021-04-21 DIAGNOSIS — K91858 Other complications of intestinal pouch: Secondary | ICD-10-CM | POA: Diagnosis not present

## 2021-04-21 DIAGNOSIS — R627 Adult failure to thrive: Secondary | ICD-10-CM | POA: Diagnosis not present

## 2021-04-21 DIAGNOSIS — Z885 Allergy status to narcotic agent status: Secondary | ICD-10-CM | POA: Diagnosis not present

## 2021-04-21 DIAGNOSIS — I10 Essential (primary) hypertension: Secondary | ICD-10-CM | POA: Diagnosis not present

## 2021-04-21 DIAGNOSIS — Z5331 Laparoscopic surgical procedure converted to open procedure: Secondary | ICD-10-CM | POA: Diagnosis not present

## 2021-04-21 DIAGNOSIS — K91850 Pouchitis: Secondary | ICD-10-CM | POA: Diagnosis not present

## 2021-04-21 DIAGNOSIS — G8918 Other acute postprocedural pain: Secondary | ICD-10-CM | POA: Diagnosis not present

## 2021-04-29 ENCOUNTER — Telehealth: Payer: Self-pay | Admitting: Gastroenterology

## 2021-04-29 NOTE — Telephone Encounter (Signed)
Glad to hear she got through her surgery okay.  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  I have not heard back from Dr. Drue Flirt yet.  Can you please schedule an appointment for the patient to see me in the office as well.  Thanks

## 2021-04-29 NOTE — Telephone Encounter (Signed)
Spoke with patient, she states that she no longer has her J-pouch. She now has a loop ileostomy and was wanting to know if she needs to continue with Entyvio or not. Pt states that she was due for her Entyvio last week but was in the hospital so she missed that dose. Her last dose was about 5 week ago now. Patient states that Dr. Drue Flirt said that she reached out to [place] to give Dr. Havery Moros an update on what was done for the patient. Please advise, thanks.

## 2021-04-30 NOTE — Telephone Encounter (Signed)
Spoke with patient in regards to recommendations. Pt states that her insurance is also no longer paying for a nurse to come out to her home for her [name] so she is going to have to figure something else out. Pt has been scheduled for a follow up with Dr. Havery Moros on Wednesday, 05/20/21 at 3:20 pm.  Pt had no concerns at the end of the call.

## 2021-05-01 ENCOUNTER — Encounter (HOSPITAL_COMMUNITY): Payer: Self-pay

## 2021-05-01 ENCOUNTER — Encounter: Payer: Self-pay | Admitting: Family

## 2021-05-01 ENCOUNTER — Emergency Department (HOSPITAL_COMMUNITY)
Admission: EM | Admit: 2021-05-01 | Discharge: 2021-05-01 | Disposition: A | Discharge status: Home / Self Care | Payer: Medicare Other | Attending: Emergency Medicine | Admitting: Emergency Medicine

## 2021-05-01 ENCOUNTER — Other Ambulatory Visit: Payer: Self-pay

## 2021-05-01 ENCOUNTER — Emergency Department (HOSPITAL_COMMUNITY): Payer: Medicare Other

## 2021-05-01 DIAGNOSIS — Z049 Encounter for examination and observation for unspecified reason: Secondary | ICD-10-CM | POA: Diagnosis not present

## 2021-05-01 DIAGNOSIS — K9181 Other intraoperative complications of digestive system: Secondary | ICD-10-CM | POA: Diagnosis not present

## 2021-05-01 DIAGNOSIS — Z79899 Other long term (current) drug therapy: Secondary | ICD-10-CM | POA: Insufficient documentation

## 2021-05-01 DIAGNOSIS — I129 Hypertensive chronic kidney disease with stage 1 through stage 4 chronic kidney disease, or unspecified chronic kidney disease: Secondary | ICD-10-CM | POA: Diagnosis not present

## 2021-05-01 DIAGNOSIS — E1122 Type 2 diabetes mellitus with diabetic chronic kidney disease: Secondary | ICD-10-CM | POA: Insufficient documentation

## 2021-05-01 DIAGNOSIS — R000 Tachycardia, unspecified: Secondary | ICD-10-CM | POA: Insufficient documentation

## 2021-05-01 DIAGNOSIS — N184 Chronic kidney disease, stage 4 (severe): Secondary | ICD-10-CM | POA: Insufficient documentation

## 2021-05-01 DIAGNOSIS — I10 Essential (primary) hypertension: Secondary | ICD-10-CM | POA: Diagnosis not present

## 2021-05-01 DIAGNOSIS — D72829 Elevated white blood cell count, unspecified: Secondary | ICD-10-CM | POA: Insufficient documentation

## 2021-05-01 DIAGNOSIS — T8149XA Infection following a procedure, other surgical site, initial encounter: Secondary | ICD-10-CM | POA: Diagnosis not present

## 2021-05-01 DIAGNOSIS — Z20822 Contact with and (suspected) exposure to covid-19: Secondary | ICD-10-CM | POA: Diagnosis not present

## 2021-05-01 DIAGNOSIS — J45909 Unspecified asthma, uncomplicated: Secondary | ICD-10-CM | POA: Insufficient documentation

## 2021-05-01 DIAGNOSIS — Z90710 Acquired absence of both cervix and uterus: Secondary | ICD-10-CM | POA: Diagnosis not present

## 2021-05-01 DIAGNOSIS — G629 Polyneuropathy, unspecified: Secondary | ICD-10-CM | POA: Diagnosis not present

## 2021-05-01 DIAGNOSIS — Z87891 Personal history of nicotine dependence: Secondary | ICD-10-CM | POA: Insufficient documentation

## 2021-05-01 DIAGNOSIS — I700 Atherosclerosis of aorta: Secondary | ICD-10-CM | POA: Diagnosis not present

## 2021-05-01 DIAGNOSIS — T8130XA Disruption of wound, unspecified, initial encounter: Secondary | ICD-10-CM

## 2021-05-01 DIAGNOSIS — T8140XA Infection following a procedure, unspecified, initial encounter: Secondary | ICD-10-CM

## 2021-05-01 DIAGNOSIS — E785 Hyperlipidemia, unspecified: Secondary | ICD-10-CM | POA: Diagnosis not present

## 2021-05-01 DIAGNOSIS — K9189 Other postprocedural complications and disorders of digestive system: Secondary | ICD-10-CM

## 2021-05-01 DIAGNOSIS — D631 Anemia in chronic kidney disease: Secondary | ICD-10-CM | POA: Diagnosis not present

## 2021-05-01 DIAGNOSIS — T8131XA Disruption of external operation (surgical) wound, not elsewhere classified, initial encounter: Secondary | ICD-10-CM | POA: Insufficient documentation

## 2021-05-01 DIAGNOSIS — K449 Diaphragmatic hernia without obstruction or gangrene: Secondary | ICD-10-CM | POA: Diagnosis not present

## 2021-05-01 DIAGNOSIS — Z9049 Acquired absence of other specified parts of digestive tract: Secondary | ICD-10-CM | POA: Diagnosis not present

## 2021-05-01 DIAGNOSIS — Z833 Family history of diabetes mellitus: Secondary | ICD-10-CM | POA: Diagnosis not present

## 2021-05-01 LAB — CBC WITH DIFFERENTIAL/PLATELET
Abs Immature Granulocytes: 0.35 10*3/uL — ABNORMAL HIGH (ref 0.00–0.07)
Basophils Absolute: 0.1 10*3/uL (ref 0.0–0.1)
Basophils Relative: 0 %
Eosinophils Absolute: 0.2 10*3/uL (ref 0.0–0.5)
Eosinophils Relative: 1 %
HCT: 31.5 % — ABNORMAL LOW (ref 36.0–46.0)
Hemoglobin: 10.2 g/dL — ABNORMAL LOW (ref 12.0–15.0)
Immature Granulocytes: 2 %
Lymphocytes Relative: 12 %
Lymphs Abs: 2.1 10*3/uL (ref 0.7–4.0)
MCH: 32.5 pg (ref 26.0–34.0)
MCHC: 32.4 g/dL (ref 30.0–36.0)
MCV: 100.3 fL — ABNORMAL HIGH (ref 80.0–100.0)
Monocytes Absolute: 1.1 10*3/uL — ABNORMAL HIGH (ref 0.1–1.0)
Monocytes Relative: 6 %
Neutro Abs: 14.1 10*3/uL — ABNORMAL HIGH (ref 1.7–7.7)
Neutrophils Relative %: 79 %
Platelets: 525 10*3/uL — ABNORMAL HIGH (ref 150–400)
RBC: 3.14 MIL/uL — ABNORMAL LOW (ref 3.87–5.11)
RDW: 14.6 % (ref 11.5–15.5)
WBC: 17.9 10*3/uL — ABNORMAL HIGH (ref 4.0–10.5)
nRBC: 0 % (ref 0.0–0.2)

## 2021-05-01 LAB — COMPREHENSIVE METABOLIC PANEL
ALT: 42 U/L (ref 0–44)
AST: 38 U/L (ref 15–41)
Albumin: 3.5 g/dL (ref 3.5–5.0)
Alkaline Phosphatase: 151 U/L — ABNORMAL HIGH (ref 38–126)
Anion gap: 16 — ABNORMAL HIGH (ref 5–15)
BUN: 38 mg/dL — ABNORMAL HIGH (ref 8–23)
CO2: 29 mmol/L (ref 22–32)
Calcium: 9.7 mg/dL (ref 8.9–10.3)
Chloride: 91 mmol/L — ABNORMAL LOW (ref 98–111)
Creatinine, Ser: 3.54 mg/dL — ABNORMAL HIGH (ref 0.44–1.00)
GFR, Estimated: 14 mL/min — ABNORMAL LOW (ref >60)
Glucose, Bld: 125 mg/dL — ABNORMAL HIGH (ref 70–99)
Potassium: 4.2 mmol/L (ref 3.5–5.1)
Sodium: 136 mmol/L (ref 135–145)
Total Bilirubin: 1 mg/dL (ref 0.3–1.2)
Total Protein: 9 g/dL — ABNORMAL HIGH (ref 6.5–8.1)

## 2021-05-01 LAB — RESP PANEL BY RT-PCR (FLU A&B, COVID) ARPGX2
Influenza A by PCR: NEGATIVE
Influenza B by PCR: NEGATIVE
SARS Coronavirus 2 by RT PCR: NEGATIVE

## 2021-05-01 LAB — LACTIC ACID, PLASMA: Lactic Acid, Venous: 1.5 mmol/L (ref 0.5–1.9)

## 2021-05-01 IMAGING — CT CT ABD-PELV W/O CM
2 of 4 series · 16 of 46 positions shown, 18 images · non-contrast
Comparison: [DATE]

CLINICAL DATA: Status post ileostomy on [REDACTED] with drainage
from incision site.

EXAM:
CT ABDOMEN AND PELVIS WITHOUT CONTRAST
TECHNIQUE: Multidetector CT imaging of the abdomen and pelvis was performed
following the standard protocol without IV contrast.

[Series 2: axial st · axial · 0.88mm/px · z∈[+920,+1355]mm · 13 of 99 slices shown, 15 images]
[im 6/99  soft-tissue]
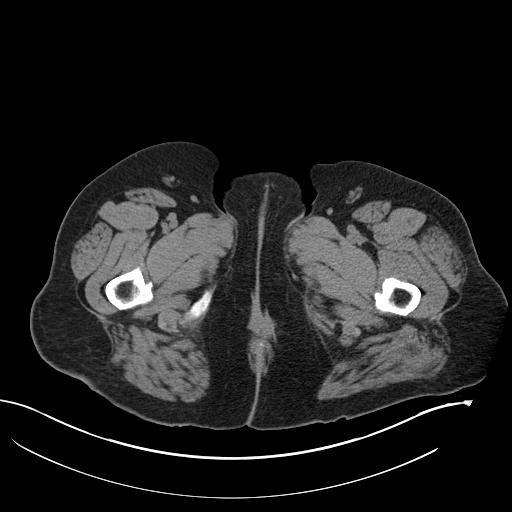
[im 6/99  bone]
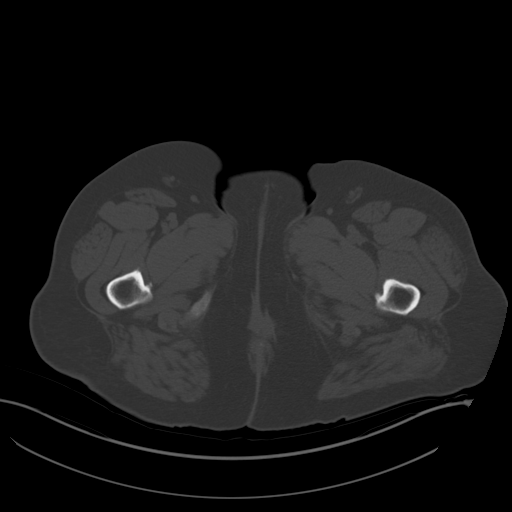
[im 11/99  soft-tissue]
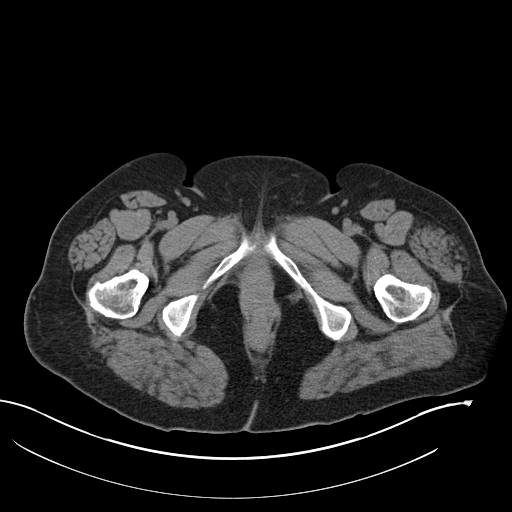
[im 22/99  soft-tissue]
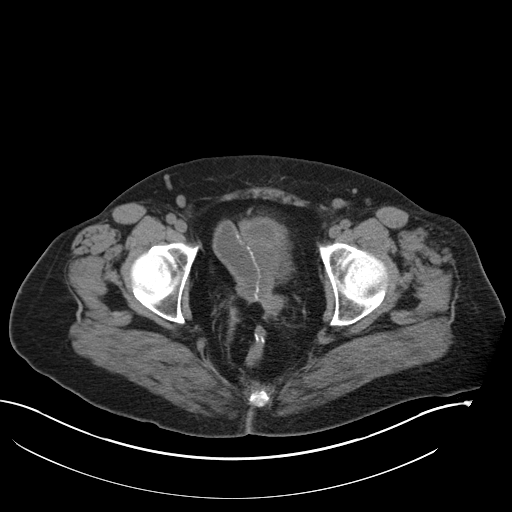
[im 28/99  soft-tissue]
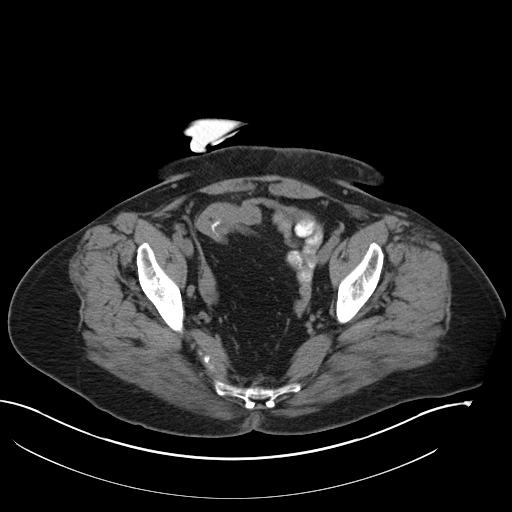
[im 33/99  soft-tissue]
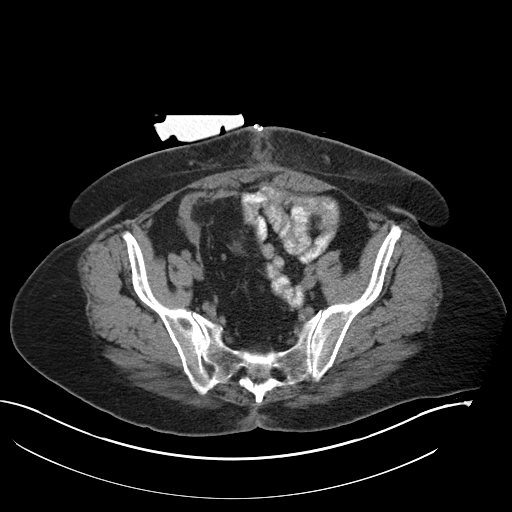
[im 44/99  soft-tissue]
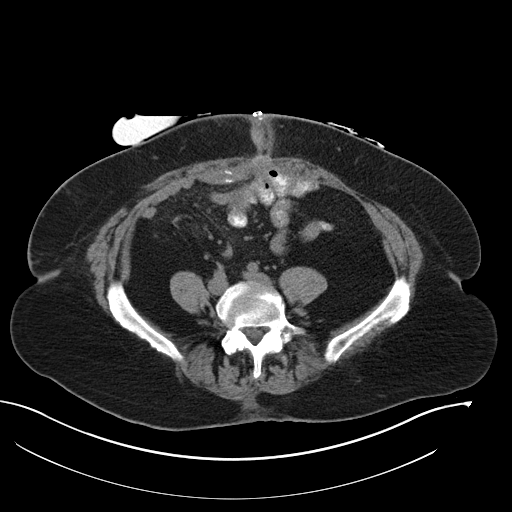
[im 50/99  soft-tissue]
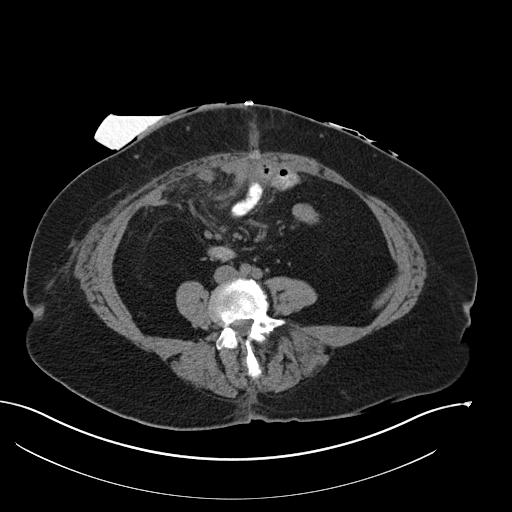
[im 55/99  soft-tissue]
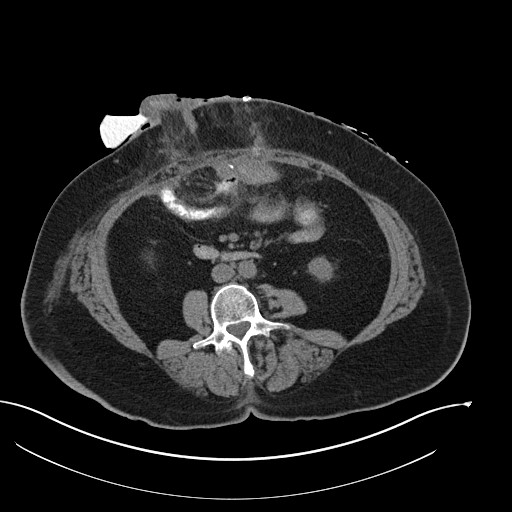
[im 66/99  soft-tissue]
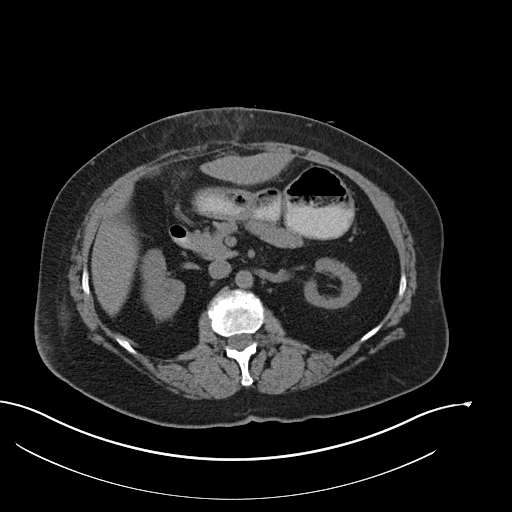
[im 66/99  bone]
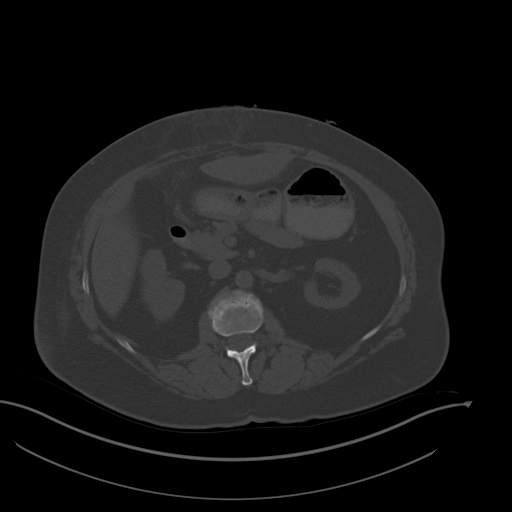
[im 71/99  soft-tissue]
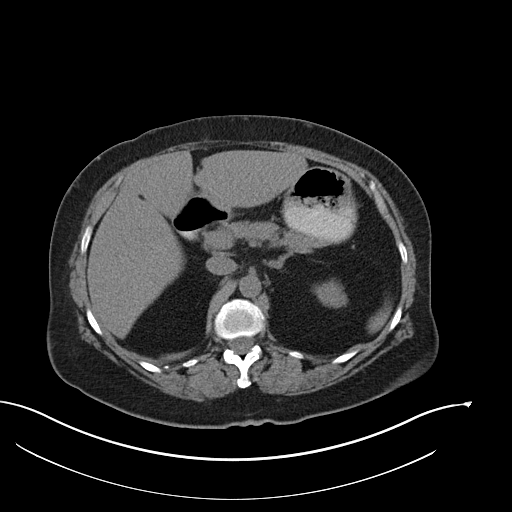
[im 77/99  soft-tissue]
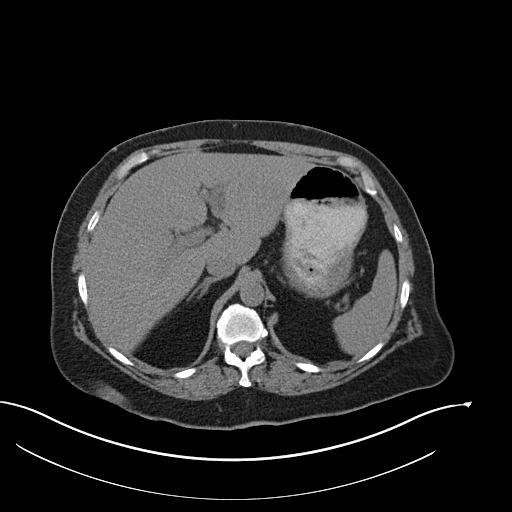
[im 88/99  soft-tissue]
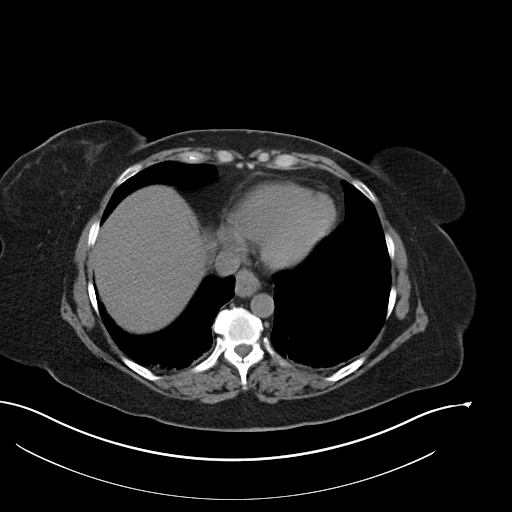
[im 93/99  soft-tissue]
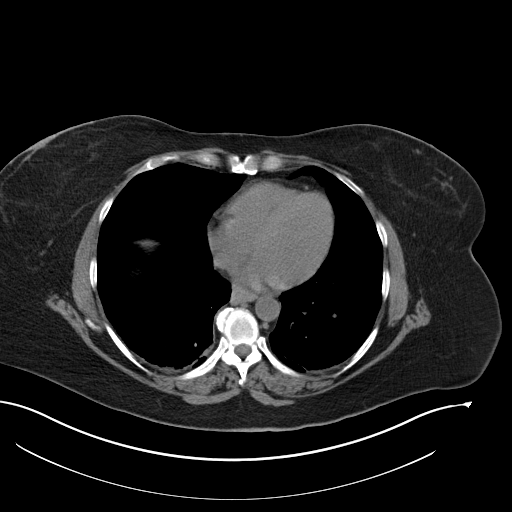

[Series 5: coronal st · coronal · 0.88mm/px · 3 of 151 slices shown]
[im 51/151  soft-tissue]
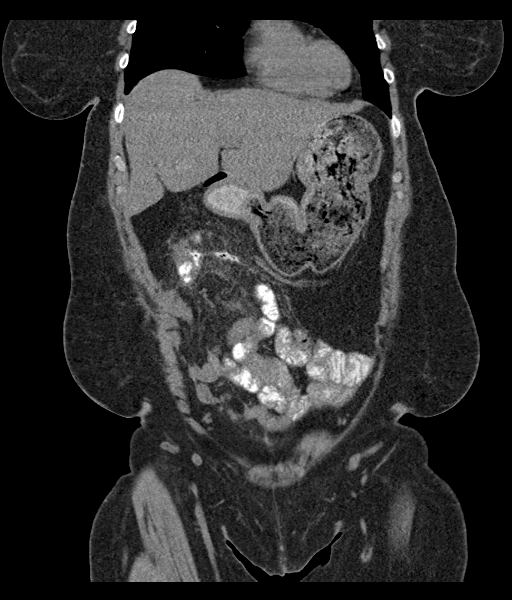
[im 67/151  soft-tissue]
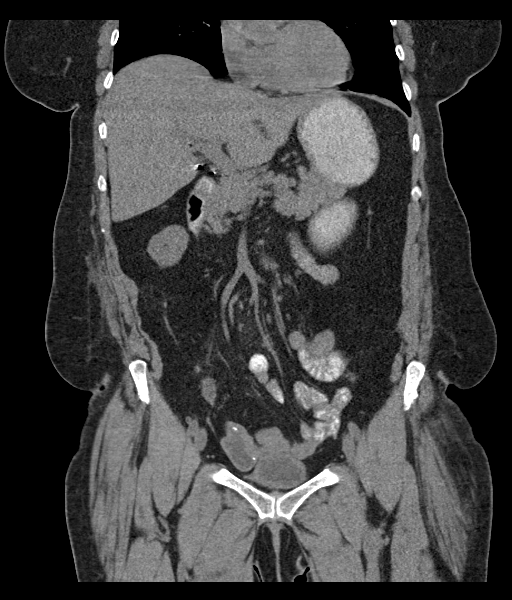
[im 84/151  soft-tissue]
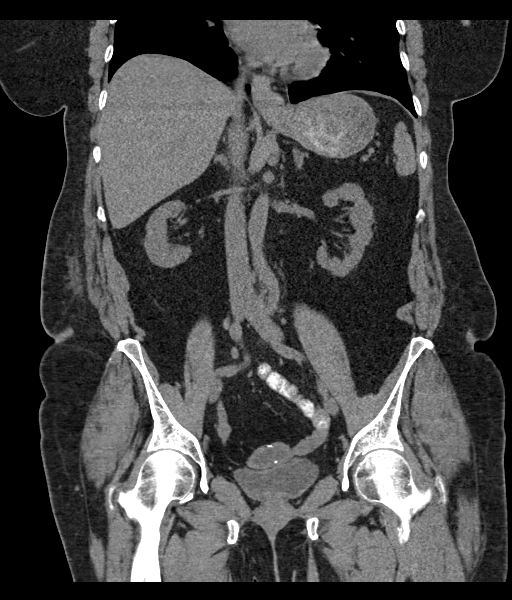

[16 of 46 positions shown; findings below may reference images not displayed]

FINDINGS: Lower chest: Mild atelectasis is seen along the posterior aspects of
the bilateral lung bases.

Hepatobiliary: No focal liver abnormality is seen. Status post
cholecystectomy. No biliary dilatation.

Pancreas: Unremarkable. No pancreatic ductal dilatation or
surrounding inflammatory changes.

Spleen: Normal in size without focal abnormality.

Adrenals/Urinary Tract: Adrenal glands are unremarkable. Kidneys are
normal, without renal calculi, focal lesion, or hydronephrosis.
Bladder is unremarkable.

Stomach/Bowel: There is a small hiatal hernia. Surgically
anastomosed bowel is seen within the inferior aspect of the pelvis
and anterior aspect of the lower abdomen. An ostomy site is seen
within the mid to lower right abdominal wall. Contrast is seen
throughout the small bowel as well as within the right lower
quadrant ostomy site. No evidence of bowel dilatation.

Vascular/Lymphatic: Aortic atherosclerosis. No enlarged abdominal or
pelvic lymph nodes.

Reproductive: Status post hysterectomy. No adnexal masses.

Other: A surgical scar seen along the midline of the anterior
abdominal wall this contains a mild amount of contrast attenuation
(axial CT image 38 through 40, CT series 2). A few tiny foci of air
attenuation are also noted within this surgical scar (axial CT image
39, CT series 2).

A small area of extraluminal contrast and air is seen within the
anterior aspect of the mid abdomen, adjacent to the surgical scar
(axial CT images 38 through 42, CT series 2/sagittal reformatted
images 95 through 101, CT series 6).

No abdominopelvic ascites.

Musculoskeletal: No acute or significant osseous findings.
IMPRESSION: 1. Findings worrisome for an area of focal bowel perforation versus
anastomosis leak within the anterior aspect of the mid abdomen, with
extravasation of air and contrast into the adjacent surgical
incision site.
2. Postoperative changes within subsequent right lower quadrant
ileostomy.
3. Aortic atherosclerosis.

Aortic Atherosclerosis ([GJ]-[GJ]).

## 2021-05-01 MED ORDER — PIPERACILLIN-TAZOBACTAM 3.375 G IVPB
3.3750 g | Freq: Two times a day (BID) | INTRAVENOUS | Status: DC
  Administered 2021-05-01: 3.375 g via INTRAVENOUS
  Filled 2021-05-01: qty 50

## 2021-05-01 MED ORDER — IOHEXOL 9 MG/ML PO SOLN
1000.0000 mL | ORAL | Status: AC
Start: 2021-05-01 — End: 2021-05-01
  Administered 2021-05-01: 1000 mL via ORAL

## 2021-05-01 MED ORDER — SODIUM CHLORIDE 0.9 % IV BOLUS
1000.0000 mL | Freq: Once | INTRAVENOUS | Status: AC
  Administered 2021-05-01: 1000 mL via INTRAVENOUS

## 2021-05-01 NOTE — ED Notes (Signed)
[facility] general surgery paged

## 2021-05-01 NOTE — ED Notes (Signed)
[facility] number report [phone] - charge [phone].

## 2021-05-01 NOTE — ED Provider Notes (Signed)
[facility] DEPT Provider Note   CSN: [id number] Arrival date & time: 05/01/21  1426     History Chief Complaint  Patient presents with   Post-op Problem    [name] is a 65 y.o. 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 Drue Flirt at [facility]. Pt reports she had been doing well at home. She changed her ileostomy bag yesterday and states she noticed that her midline incision had some erythema. She did not think much of it until she noticed fluid on her clothes this morning. She examined her incision and noticed the top portion of the incision split open and has been draining similar fluid to what is in her ileostomy bag. She complains of pain to the incision as well today and some mild pain to the LLQ. No fevers. She states she didn't feel like driving to [place] today so decided to come here instead. No other complaints at this time.   The history is provided by the patient and medical records.      Past Medical History:  Diagnosis Date   Abdominal pain    Hx   Allergy    Anal stenosis    Anemia    Anxiety    Arthritis    Asthma    patient does not have inhaler   Blood in stool    Hx   Blood in urine    Hx   Blood transfusion without reported diagnosis    Cataract    CKD (chronic kidney disease) stage 3, GFR 30-59 ml/min (HCC)    Crohn's colitis, other complication (HCC)    De Quervain's tenosynovitis    Depression    Difficulty urinating    Hx   Easy bruising    Esophagitis    Fainting    History - resolved - due to dehydration   Fatigue    Hx   Fibroid    Hx   Gastric polyp    GERD (gastroesophageal reflux disease)    Hearing loss    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   Hemorrhoids, external    Hemorrhoids, internal    Herpes, genital    vaginal treated 07/05/12 and pt states is resolved   History of cervical  dysplasia    History of small bowel obstruction    Hyperlipidemia    currently no meds   Hyperparathyroidism    Hypertension    Hypokalemia    Hx   IBD (inflammatory bowel disease)    initially colectomy for suspected UC, 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   Obesity    Ovarian cyst    Poor dental hygiene    Pulmonary nodule, right    right upper lobe   Rectal bleeding    Hx   Rectal pain    Hx   Renal insufficiency    CKD - stage 3   RLS (restless legs syndrome)    no meds   Tooth infection 11/2016   right low   Ulcerative colitis    Visual disturbance    wears glasses   Weakness generalized    Hx - patient denies generalized weakness   Wears dentures    upper only    Patient Active Problem List   Diagnosis Date Noted   Chronic pain of left knee 03/10/2021   Depression, major, single episode,  severe ([place]) 12/18/2020   Hyperlipidemia associated with type 2 diabetes mellitus ([place]) 12/18/2020   Severe sepsis ([place]) 12/07/2020   Acute pyelonephritis 12/07/2020   Closed fracture of left tibial plateau 10/30/2020   Screening for osteoporosis 10/30/2020   Diarrhea with dehydration 09/21/2020   Crohn's disease of jejunum ([place]) 08/23/2020   Chronic diarrhea 75/91/6384   Metabolic acidosis 66/59/9357   ARF (acute renal failure) ([place]) 08/23/2020   Acute renal failure superimposed on stage 4 chronic kidney disease ([place]) 08/22/2020   Hyperglycemia 02/26/2020   Immunologic deficiency syndrome ([place]) 02/26/2020   Prothrombin gene mutation ([place]) 02/26/2020   Acute pain of left shoulder 06/04/2019   Pes anserine bursitis 04/12/2018   Pain of right thumb 04/12/2018   Low back pain 06/21/2017   Acute right-sided low back pain with right-sided sciatica 06/21/2017   Gout 06/21/2017   Superficial thrombophlebitis of right upper extremity 01/77/9390   Acute basilic vein thrombosis, right 02/16/2017   Symptomatic anemia 01/15/2017   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/01/2015   Rectal pain 12/01/2015   IBD (inflammatory bowel disease) 10/24/2015   Anal stricture 10/24/2015   Gout of foot 10/29/2014   Ovarian cyst, right 06/12/2014   Acute on chronic renal failure ([place]) 04/02/2013   Hydrosalpinx 02/14/2013   Hyperparathyroidism ([place]) 09/18/2012   HSV (herpes simplex virus) anogenital infection 06/28/2012   CKD (chronic kidney disease), stage IV ([place]) 06/17/2012   LLQ abdominal pain 06/22/2011   RESTLESS LEGS SYNDROME 08/05/2009   PULMONARY NODULE, RIGHT UPPER LOBE 02/28/2009   TOBACCO ABUSE, HX OF 02/11/2009   PAIN IN JOINT, MULTIPLE SITES 02/04/2009   Nonspecific (abnormal) findings on radiological and other examination of body structure 12/23/2008   CT, CHEST, ABNORMAL 12/23/2008   HEMANGIOMA SIMPLEX 12/16/2008   Pain in joint, pelvic region and thigh 12/16/2008   SKIN RASH 12/16/2008   TINEA CORPORIS 04/18/2008   LATERAL EPICONDYLITIS, RIGHT 04/18/2008   Morbid obesity ([place]) 02/05/2008   ANEMIA 02/05/2008   ANAL STENOSIS 02/05/2008   Stool culture positive for Clostridioides difficile 02/05/2008   Personal history of other endocrine, metabolic, and immunity disorders 02/05/2008   Personal history of other diseases of digestive system 02/05/2008   SYNCOPE AND COLLAPSE 02/02/2008   Hyponatremia 12/15/2007   Dehydration 12/15/2007   HYPOKALEMIA 12/15/2007   ANEMIA-IRON DEFICIENCY 12/15/2007   LEUKOCYTOSIS 12/15/2007   Anxiety state 12/15/2007   Disorder resulting from impaired renal function 11/16/2007   TINNITUS NOS 05/25/2007   DE QUERVAIN'S TENOSYNOVITIS 05/25/2007   Hyperlipidemia LDL goal <100 10/23/2006   Depression 10/23/2006   Essential hypertension 10/23/2006   GERD 10/23/2006   Gastro-esophageal reflux disease without esophagitis 10/23/2006   Ulcerative colitis without complications ([place]) 30/05/2329   GASTRIC POLYP 11/16/2001   GERD with esophagitis 11/16/2001    Past Surgical History:  Procedure Laterality Date    ANAL DILATION     CERVICAL BIOPSY  W/ LOOP ELECTRODE EXCISION     CHOLECYSTECTOMY     COLONOSCOPY     [name]   fatty tumor removed from back     X 2   HEMORRHOID SURGERY     ILEOSTOMY CLOSURE     RESTORATIVE PROCTOCOLECTOMY     with insertion of ileoanal J Pouch with loop ileostomy   SHOULDER ARTHROSCOPY WITH CAPSULORRHAPHY Left 06/14/2019   Procedure: LEFT SHOULDER ARTHRSCOPIC REPAIR OF BONY BANKART FRACTURE;  Surgeon: Tania Ade, MD;  Location: [facility];  Service: Orthopedics;  Laterality: Left;  REQUEST 90 MINUTE   SIGMOIDOSCOPY  TOTAL ABDOMINAL HYSTERECTOMY  1998   TAH/LSO   TUBAL LIGATION     UPPER GASTROINTESTINAL ENDOSCOPY     [name]     OB History     Gravida  4   Para  2   Term  2   Preterm      AB  2   Living  2      SAB  2   IAB      Ectopic      Multiple      Live Births  2           Family History  Problem Relation Age of Onset   Ulcerative colitis Father    Hypertension Father    Heart attack Father    Hypertension Mother    Heart disease Mother        s/p pci   Ulcerative colitis Daughter    Irritable bowel syndrome Other        grandchildren   Diabetes Sister    Cancer Sister        uterine   Cancer Maternal Uncle        LUNG   Colon cancer Neg Hx    Esophageal cancer Neg Hx    Stomach cancer Neg Hx    Rectal cancer Neg Hx     Social History   Tobacco Use   Smoking status: Former    Packs/day: 1.00    Years: 4.00    Pack years: 4.00    Types: Cigarettes    Quit date: 05/21/1979    Years since quitting: 41.9   Smokeless tobacco: Never  Vaping Use   Vaping Use: Never used  Substance Use Topics   Alcohol use: No    Alcohol/week: 0.0 standard drinks   Drug use: No    Home Medications Prior to Admission medications   Medication Sig Start Date End Date Taking? Authorizing Provider  acetaminophen (TYLENOL) 500 MG tablet Take 500-1,000 mg by mouth at bedtime.   Yes [provider]  allopurinol  (ZYLOPRIM) 300 MG tablet TAKE 1 TABLET BY MOUTH EVERY DAY Patient taking differently: Take 300 mg by mouth at bedtime. 02/13/21  Yes Ann Held, DO  Calcium Carbonate Antacid (TUMS EXTRA STRENGTH 750 PO) Take 1-2 tablets by mouth See admin instructions. Chew 1-2 tablets by mouth as needed for heartburn or reflux   Yes [provider]  DULoxetine (CYMBALTA) 60 MG capsule Take 2 capsules (120 mg total) by mouth daily. Patient taking differently: Take 120 mg by mouth at bedtime. 11/05/20  Yes Lowne Chase, Yvonne R, DO  ENTYVIO 300 MG injection Inject 300 mg into the vein every 28 (twenty-eight) days.   Yes [provider]  folic acid (FOLVITE) 1 MG tablet Take 2 tablets (2 mg total) by mouth daily. Patient taking differently: Take 1-2 mg by mouth daily. 05/26/17  Yes Ennever, Rudell Cobb, MD  gabapentin (NEURONTIN) 100 MG capsule TAKE 1 CAPSULE BY MOUTH THREE TIMES A DAY Patient taking differently: Take 100 mg by mouth 3 (three) times daily. 02/13/21  Yes Roma Schanz R, DO  metoprolol succinate (TOPROL-XL) 100 MG 24 hr tablet Take 50-100 mg by mouth See admin instructions. Take 100 mg by mouth in the morning and 50 mg at bedtime   Yes [provider]  oxyCODONE (OXY IR/ROXICODONE) 5 MG immediate release tablet Take 5 mg by mouth at bedtime. 04/27/21  Yes [provider]  Vitamin D, Ergocalciferol, (DRISDOL) 1.25  MG (50000 UNIT) CAPS capsule Take 50,000 Units by mouth every 30 (thirty) days.   Yes [provider]  famotidine (PEPCID) 20 MG tablet TAKE 1 TABLET BY MOUTH TWICE A DAY Patient not taking: Reported on 05/01/2021 04/01/21   Armbruster, Carlota Raspberry, MD  NONFORMULARY OR COMPOUNDED ITEM NerveE for neuropathy Patient not taking: Reported on 05/01/2021 03/10/21   Carollee Herter, Alferd Apa, DO  ondansetron (ZOFRAN) 4 MG tablet Take 1 tablet (4 mg total) by mouth every 6 (six) hours as needed for nausea. Patient not taking: Reported on 05/01/2021 12/10/20    Raiford Noble Latif, DO    Allergies    Sulfa antibiotics, Morphine, and Sulfonamide derivatives  Review of Systems   Review of Systems  Constitutional:  Negative for chills and fever.  Gastrointestinal:  Positive for abdominal pain.  Skin:  Positive for color change and wound.  All other systems reviewed and are negative.  Physical Exam Updated Vital Signs BP 139/80   Pulse (!) 108   Temp 98.4 F (36.9 C) (Oral)   Resp 18   SpO2 99%   Physical Exam Vitals and nursing note reviewed.  Constitutional:      Appearance: She is obese. She is not ill-appearing or diaphoretic.  HENT:     Head: Normocephalic and atraumatic.  Eyes:     Conjunctiva/sclera: Conjunctivae normal.  Cardiovascular:     Rate and Rhythm: Regular rhythm. Tachycardia present.  Pulmonary:     Effort: Pulmonary effort is normal.     Breath sounds: Normal breath sounds. No wheezing, rhonchi or rales.  Abdominal:     Palpations: Abdomen is soft.     Tenderness: There is abdominal tenderness. There is no guarding or rebound.     Comments: See photo below. Midline incision to abdomen with surrounding erythema. Upper portion of the incision with dehiscence and some purulent drainage as well as similar drainage that is present in ileostomy bag. Mild TTP to this area as well as TTP to the LLQ. No rebound or guarding. Ileostomy intact without signs of erythema to pouch.   Musculoskeletal:     Cervical back: Neck supple.  Skin:    General: Skin is warm and dry.  Neurological:     Mental Status: She is alert.      ED Results / Procedures / Treatments   Labs (all labs ordered are listed, but only abnormal results are displayed) Labs Reviewed  COMPREHENSIVE METABOLIC PANEL - Abnormal; Notable for the following components:      Result Value   Chloride 91 (*)    Glucose, Bld 125 (*)    BUN 38 (*)    Creatinine, Ser 3.54 (*)    Total Protein 9.0 (*)    Alkaline Phosphatase 151 (*)    GFR, Estimated 14 (*)     Anion gap 16 (*)    All other components within normal limits  CBC WITH DIFFERENTIAL/PLATELET - Abnormal; Notable for the following components:   WBC 17.9 (*)    RBC 3.14 (*)    Hemoglobin 10.2 (*)    HCT 31.5 (*)    MCV 100.3 (*)    Platelets 525 (*)    Neutro Abs 14.1 (*)    Monocytes Absolute 1.1 (*)    Abs Immature Granulocytes 0.35 (*)    All other components within normal limits  CULTURE, BLOOD (ROUTINE X 2)  CULTURE, BLOOD (ROUTINE X 2)  AEROBIC CULTURE W GRAM STAIN (SUPERFICIAL SPECIMEN)  RESP PANEL BY RT-PCR (  FLU A&B, COVID) ARPGX2  ANAEROBIC CULTURE W GRAM STAIN  LACTIC ACID, PLASMA  LACTIC ACID, PLASMA    EKG None  Radiology CT ABDOMEN PELVIS WO CONTRAST  Result Date: 05/01/2021 CLINICAL DATA:  Status post ileostomy on August 18th with drainage from incision site. EXAM: CT ABDOMEN AND PELVIS WITHOUT CONTRAST TECHNIQUE: Multidetector CT imaging of the abdomen and pelvis was performed following the standard protocol without IV contrast. COMPARISON:  December 07, 2020 FINDINGS: Lower chest: Mild atelectasis is seen along the posterior aspects of the bilateral lung bases. Hepatobiliary: No focal liver abnormality is seen. Status post cholecystectomy. No biliary dilatation. Pancreas: Unremarkable. No pancreatic ductal dilatation or surrounding inflammatory changes. Spleen: Normal in size without focal abnormality. Adrenals/Urinary Tract: Adrenal glands are unremarkable. Kidneys are normal, without renal calculi, focal lesion, or hydronephrosis. Bladder is unremarkable. Stomach/Bowel: There is a small hiatal hernia. Surgically anastomosed bowel is seen within the inferior aspect of the pelvis and anterior aspect of the lower abdomen. An ostomy site is seen within the mid to lower right abdominal wall. Contrast is seen throughout the small bowel as well as within the right lower quadrant ostomy site. No evidence of bowel dilatation. Vascular/Lymphatic: Aortic atherosclerosis. No  enlarged abdominal or pelvic lymph nodes. Reproductive: Status post hysterectomy. No adnexal masses. Other: A surgical scar seen along the midline of the anterior abdominal wall this contains a mild amount of contrast attenuation (axial CT image 38 through 40, CT series 2). A few tiny foci of air attenuation are also noted within this surgical scar (axial CT image 39, CT series 2). A small area of extraluminal contrast and air is seen within the anterior aspect of the mid abdomen, adjacent to the surgical scar (axial CT images 38 through 42, CT series 2/sagittal reformatted images 95 through 101, CT series 6). No abdominopelvic ascites. Musculoskeletal: No acute or significant osseous findings. IMPRESSION: 1. Findings worrisome for an area of focal bowel perforation versus anastomosis leak within the anterior aspect of the mid abdomen, with extravasation of air and contrast into the adjacent surgical incision site. 2. Postoperative changes within subsequent right lower quadrant ileostomy. 3. Aortic atherosclerosis. Aortic Atherosclerosis (ICD10-I70.0). Electronically Signed   By: Virgina Norfolk M.D.   On: 05/01/2021 19:05    Procedures Procedures   Medications Ordered in ED Medications  piperacillin-tazobactam (ZOSYN) IVPB 3.375 g (3.375 g Intravenous New Bag/Given 05/01/21 1931)  sodium chloride 0.9 % bolus 1,000 mL (1,000 mLs Intravenous New Bag/Given 05/01/21 1638)  iohexol (OMNIPAQUE) 9 MG/ML oral solution 1,000 mL (1,000 mLs Oral Contrast Given 05/01/21 1630)    ED Course  I have reviewed the triage vital signs and the nursing notes.  Pertinent labs & imaging results that were available during my care of the patient were reviewed by me and considered in my medical decision making (see chart for details).  Clinical Course as of 05/01/21 2040  Fri May 01, 2021  1655 WBC(!): 17.9 [MV]  1655 Lactic Acid, Venous: 1.5 [MV]  1655 Creatinine(!): 3.54 [MV]  1906 Extra axial contrast and air  adjacent to one of surgical anastomosis sites - right behind her incision site.  [MV]    Clinical Course User Index [MV] Eustaquio Maize, PA-C   MDM Rules/Calculators/A&P                           65 year old female who recently underwent ileostomy who presents to the ED today with wound dehiscence as well as  purulent drainage that she began noticing today.  She reports erythema to the wound yesterday.  On arrival to the ED today patient is afebrile.  She is tachycardic at 113, nontachypneic.  Nontoxic-appearing.  She is noted to have a midline abdominal incision with surrounding erythema.  The top portion of the wound has slightly dehisced with purulent drainage as well as similar drainage that is present in the ileostomy bag noted.  She is surrounding tenderness palpation as well as left lower quadrant tenderness palpation.  We will plan for labs and CT abdomen and pelvis at this time for further evaluation.  Patient did have her surgery done at [facility], she states she did not want to drive all the way to [facility] today prompting ED visit here.  May need to touch base with patient's surgeon.   CBC with leukocytosis 17,900. Hgb stable at 10.2 CMP with worsening creatinine 3.54 and BUN 38. Pt receiving fluids at this time.   Lab Results  Component Value Date   CREATININE 3.54 (H) 05/01/2021   CREATININE 2.24 (H) 12/18/2020   CREATININE 2.13 (H) 12/10/2020   Lactic acid WNL at 1.5  CT A/P changed to with oral contrast given elevated creatinine and concern for fistula. Will await results at this time.   Received call from radiologist regarding CT findings. Concern for perforation vs anastomosis. Will call [facility] for transfer at [facility] stime. Pt started on 2.275 Zosyn due to CrCl 22  Discussed case with Dr. Eugenia [facility] who agrees pt needs to be transferred for further eval. Currently awaiting call from transfer line to see what bed availability is like.   Nursing staff received  call from transfer line. Pt has a bed at [facility]. Will be transferred.   This note was prepared using Dragon voice recognition software and may include unintentional dictation errors due to the inherent limitations of voice recognition software.   Final Clinical Impression(s) / ED Diagnoses Final diagnoses:  Wound dehiscence  Postoperative infection, unspecified type, initial encounter  Anastomotic leak of intestine    Rx / DC Orders ED Discharge Orders     None        Eustaquio Maize, PA-C 05/01/21 2040    Arnaldo Natal, MD 05/02/21 2328

## 2021-05-01 NOTE — ED Notes (Signed)
Report given to [place] per [facility]

## 2021-05-01 NOTE — ED Notes (Signed)
[facility] transport called

## 2021-05-01 NOTE — ED Triage Notes (Signed)
Pt had ileostomy placed on 8/18. Pt has staples to abdomen and the incision has split open and is draining fluid. Pt endorse increased pain to incision site.

## 2021-05-05 LAB — AEROBIC CULTURE W GRAM STAIN (SUPERFICIAL SPECIMEN)

## 2021-05-06 ENCOUNTER — Telehealth: Payer: Self-pay | Admitting: *Deleted

## 2021-05-06 LAB — CULTURE, BLOOD (ROUTINE X 2)
Culture: NO GROWTH
Culture: NO GROWTH
Special Requests: ADEQUATE
Special Requests: ADEQUATE

## 2021-05-06 NOTE — Telephone Encounter (Signed)
Post ED Visit - Positive Culture Follow-up  Culture report reviewed by antimicrobial stewardship pharmacist: [facility] Team []  Elenor Quinones, Pharm.D. []  Heide Guile, Pharm.D., BCPS AQ-ID []  Parks Neptune, Pharm.D., BCPS []  Alycia Rossetti, Pharm.D., BCPS []  [place], Pharm.D., BCPS, AAHIVP []  Legrand Como, Pharm.D., BCPS, AAHIVP []  Salome Arnt, PharmD, BCPS []  Johnnette Gourd, PharmD, BCPS []  Hughes Better, PharmD, BCPS []  Leeroy Cha, PharmD []  Laqueta Linden, PharmD, BCPS []  Albertina Parr, PharmD  [facility] Team []  Leodis Sias, PharmD []  Lindell Spar, PharmD []  Royetta Asal, PharmD []  Graylin Shiver, Rph []  Rema Fendt) Glennon Mac, PharmD []  Arlyn Dunning, PharmD []  Netta Cedars, PharmD []  Dia Sitter, PharmD []  Leone Haven, PharmD []  Gretta Arab, PharmD []  Theodis Shove, PharmD []  Peggyann Juba, PharmD []  Reuel Boom, PharmD   Positive wound culture Results faxed to Dr. Birdie Sons [phone] and no further patient follow-up is required at this time.  Elenor Quinones, PharmD  Harlon Flor Talley 05/06/2021, 9:40 AM

## 2021-05-07 DIAGNOSIS — Z90710 Acquired absence of both cervix and uterus: Secondary | ICD-10-CM | POA: Diagnosis not present

## 2021-05-07 DIAGNOSIS — K219 Gastro-esophageal reflux disease without esophagitis: Secondary | ICD-10-CM | POA: Diagnosis not present

## 2021-05-07 DIAGNOSIS — I10 Essential (primary) hypertension: Secondary | ICD-10-CM | POA: Diagnosis not present

## 2021-05-07 DIAGNOSIS — K5090 Crohn's disease, unspecified, without complications: Secondary | ICD-10-CM | POA: Diagnosis not present

## 2021-05-07 DIAGNOSIS — H269 Unspecified cataract: Secondary | ICD-10-CM | POA: Diagnosis not present

## 2021-05-07 DIAGNOSIS — T8131XA Disruption of external operation (surgical) wound, not elsewhere classified, initial encounter: Secondary | ICD-10-CM | POA: Diagnosis not present

## 2021-05-07 DIAGNOSIS — Z452 Encounter for adjustment and management of vascular access device: Secondary | ICD-10-CM | POA: Diagnosis not present

## 2021-05-07 DIAGNOSIS — Z432 Encounter for attention to ileostomy: Secondary | ICD-10-CM | POA: Diagnosis not present

## 2021-05-07 DIAGNOSIS — K91858 Other complications of intestinal pouch: Secondary | ICD-10-CM | POA: Diagnosis not present

## 2021-05-07 DIAGNOSIS — Z792 Long term (current) use of antibiotics: Secondary | ICD-10-CM | POA: Diagnosis not present

## 2021-05-07 DIAGNOSIS — E785 Hyperlipidemia, unspecified: Secondary | ICD-10-CM | POA: Diagnosis not present

## 2021-05-07 DIAGNOSIS — Z87891 Personal history of nicotine dependence: Secondary | ICD-10-CM | POA: Diagnosis not present

## 2021-05-07 DIAGNOSIS — Z9181 History of falling: Secondary | ICD-10-CM | POA: Diagnosis not present

## 2021-05-07 LAB — ANAEROBIC CULTURE W GRAM STAIN

## 2021-05-12 DIAGNOSIS — K9410 Enterostomy complication, unspecified: Secondary | ICD-10-CM | POA: Diagnosis not present

## 2021-05-12 DIAGNOSIS — Z4802 Encounter for removal of sutures: Secondary | ICD-10-CM | POA: Diagnosis not present

## 2021-05-12 DIAGNOSIS — K91850 Pouchitis: Secondary | ICD-10-CM | POA: Diagnosis not present

## 2021-05-14 ENCOUNTER — Other Ambulatory Visit: Payer: Self-pay

## 2021-05-14 ENCOUNTER — Inpatient Hospital Stay (HOSPITAL_COMMUNITY)
Admission: EM | Admit: 2021-05-14 | Discharge: 2021-05-16 | DRG: 683 | Disposition: A | Discharge status: Home Health | Payer: Medicare Other | Attending: Internal Medicine | Admitting: Internal Medicine

## 2021-05-14 ENCOUNTER — Emergency Department (HOSPITAL_COMMUNITY): Payer: Medicare Other

## 2021-05-14 DIAGNOSIS — R5381 Other malaise: Secondary | ICD-10-CM | POA: Diagnosis present

## 2021-05-14 DIAGNOSIS — K644 Residual hemorrhoidal skin tags: Secondary | ICD-10-CM | POA: Diagnosis present

## 2021-05-14 DIAGNOSIS — Z932 Ileostomy status: Secondary | ICD-10-CM

## 2021-05-14 DIAGNOSIS — Z20822 Contact with and (suspected) exposure to covid-19: Secondary | ICD-10-CM | POA: Diagnosis not present

## 2021-05-14 DIAGNOSIS — I10 Essential (primary) hypertension: Secondary | ICD-10-CM | POA: Diagnosis present

## 2021-05-14 DIAGNOSIS — K648 Other hemorrhoids: Secondary | ICD-10-CM | POA: Diagnosis not present

## 2021-05-14 DIAGNOSIS — I700 Atherosclerosis of aorta: Secondary | ICD-10-CM | POA: Diagnosis not present

## 2021-05-14 DIAGNOSIS — G2581 Restless legs syndrome: Secondary | ICD-10-CM | POA: Diagnosis not present

## 2021-05-14 DIAGNOSIS — R110 Nausea: Secondary | ICD-10-CM | POA: Diagnosis not present

## 2021-05-14 DIAGNOSIS — D649 Anemia, unspecified: Secondary | ICD-10-CM | POA: Diagnosis not present

## 2021-05-14 DIAGNOSIS — D75839 Thrombocytosis, unspecified: Secondary | ICD-10-CM | POA: Diagnosis present

## 2021-05-14 DIAGNOSIS — R531 Weakness: Secondary | ICD-10-CM

## 2021-05-14 DIAGNOSIS — Z8249 Family history of ischemic heart disease and other diseases of the circulatory system: Secondary | ICD-10-CM

## 2021-05-14 DIAGNOSIS — D631 Anemia in chronic kidney disease: Secondary | ICD-10-CM | POA: Diagnosis not present

## 2021-05-14 DIAGNOSIS — I129 Hypertensive chronic kidney disease with stage 1 through stage 4 chronic kidney disease, or unspecified chronic kidney disease: Secondary | ICD-10-CM | POA: Diagnosis present

## 2021-05-14 DIAGNOSIS — Z90710 Acquired absence of both cervix and uterus: Secondary | ICD-10-CM | POA: Diagnosis not present

## 2021-05-14 DIAGNOSIS — N189 Chronic kidney disease, unspecified: Secondary | ICD-10-CM | POA: Diagnosis present

## 2021-05-14 DIAGNOSIS — Z79899 Other long term (current) drug therapy: Secondary | ICD-10-CM

## 2021-05-14 DIAGNOSIS — R1111 Vomiting without nausea: Secondary | ICD-10-CM | POA: Diagnosis not present

## 2021-05-14 DIAGNOSIS — E878 Other disorders of electrolyte and fluid balance, not elsewhere classified: Secondary | ICD-10-CM | POA: Diagnosis present

## 2021-05-14 DIAGNOSIS — E871 Hypo-osmolality and hyponatremia: Secondary | ICD-10-CM | POA: Diagnosis not present

## 2021-05-14 DIAGNOSIS — T8131XA Disruption of external operation (surgical) wound, not elsewhere classified, initial encounter: Secondary | ICD-10-CM | POA: Diagnosis present

## 2021-05-14 DIAGNOSIS — K91850 Pouchitis: Secondary | ICD-10-CM | POA: Diagnosis present

## 2021-05-14 DIAGNOSIS — M654 Radial styloid tenosynovitis [de Quervain]: Secondary | ICD-10-CM | POA: Diagnosis present

## 2021-05-14 DIAGNOSIS — R0682 Tachypnea, not elsewhere classified: Secondary | ICD-10-CM | POA: Diagnosis present

## 2021-05-14 DIAGNOSIS — Z8719 Personal history of other diseases of the digestive system: Secondary | ICD-10-CM | POA: Diagnosis not present

## 2021-05-14 DIAGNOSIS — R000 Tachycardia, unspecified: Secondary | ICD-10-CM | POA: Diagnosis not present

## 2021-05-14 DIAGNOSIS — R1084 Generalized abdominal pain: Secondary | ICD-10-CM | POA: Diagnosis not present

## 2021-05-14 DIAGNOSIS — E860 Dehydration: Secondary | ICD-10-CM | POA: Diagnosis present

## 2021-05-14 DIAGNOSIS — Z801 Family history of malignant neoplasm of trachea, bronchus and lung: Secondary | ICD-10-CM

## 2021-05-14 DIAGNOSIS — Z87410 Personal history of cervical dysplasia: Secondary | ICD-10-CM

## 2021-05-14 DIAGNOSIS — Z6834 Body mass index (BMI) 34.0-34.9, adult: Secondary | ICD-10-CM | POA: Diagnosis not present

## 2021-05-14 DIAGNOSIS — Z882 Allergy status to sulfonamides status: Secondary | ICD-10-CM

## 2021-05-14 DIAGNOSIS — R112 Nausea with vomiting, unspecified: Secondary | ICD-10-CM | POA: Diagnosis not present

## 2021-05-14 DIAGNOSIS — R6510 Systemic inflammatory response syndrome (SIRS) of non-infectious origin without acute organ dysfunction: Secondary | ICD-10-CM | POA: Diagnosis not present

## 2021-05-14 DIAGNOSIS — E872 Acidosis: Secondary | ICD-10-CM | POA: Diagnosis present

## 2021-05-14 DIAGNOSIS — Z9049 Acquired absence of other specified parts of digestive tract: Secondary | ICD-10-CM | POA: Diagnosis not present

## 2021-05-14 DIAGNOSIS — N179 Acute kidney failure, unspecified: Principal | ICD-10-CM | POA: Diagnosis present

## 2021-05-14 DIAGNOSIS — E785 Hyperlipidemia, unspecified: Secondary | ICD-10-CM | POA: Diagnosis not present

## 2021-05-14 DIAGNOSIS — K219 Gastro-esophageal reflux disease without esophagitis: Secondary | ICD-10-CM | POA: Diagnosis not present

## 2021-05-14 DIAGNOSIS — H9192 Unspecified hearing loss, left ear: Secondary | ICD-10-CM | POA: Diagnosis not present

## 2021-05-14 DIAGNOSIS — F32A Depression, unspecified: Secondary | ICD-10-CM | POA: Diagnosis not present

## 2021-05-14 DIAGNOSIS — K2100 Gastro-esophageal reflux disease with esophagitis, without bleeding: Secondary | ICD-10-CM | POA: Diagnosis present

## 2021-05-14 DIAGNOSIS — N184 Chronic kidney disease, stage 4 (severe): Secondary | ICD-10-CM | POA: Diagnosis present

## 2021-05-14 DIAGNOSIS — K50119 Crohn's disease of large intestine with unspecified complications: Secondary | ICD-10-CM | POA: Diagnosis present

## 2021-05-14 DIAGNOSIS — Z833 Family history of diabetes mellitus: Secondary | ICD-10-CM

## 2021-05-14 DIAGNOSIS — E669 Obesity, unspecified: Secondary | ICD-10-CM | POA: Diagnosis present

## 2021-05-14 DIAGNOSIS — D72829 Elevated white blood cell count, unspecified: Secondary | ICD-10-CM | POA: Diagnosis present

## 2021-05-14 DIAGNOSIS — Z885 Allergy status to narcotic agent status: Secondary | ICD-10-CM

## 2021-05-14 LAB — COMPREHENSIVE METABOLIC PANEL
ALT: 71 U/L — ABNORMAL HIGH (ref 0–44)
AST: 51 U/L — ABNORMAL HIGH (ref 15–41)
Albumin: 3.4 g/dL — ABNORMAL LOW (ref 3.5–5.0)
Alkaline Phosphatase: 207 U/L — ABNORMAL HIGH (ref 38–126)
Anion gap: 20 — ABNORMAL HIGH (ref 5–15)
BUN: 79 mg/dL — ABNORMAL HIGH (ref 8–23)
CO2: 24 mmol/L (ref 22–32)
Calcium: 10.1 mg/dL (ref 8.9–10.3)
Chloride: 85 mmol/L — ABNORMAL LOW (ref 98–111)
Creatinine, Ser: 4.94 mg/dL — ABNORMAL HIGH (ref 0.44–1.00)
GFR, Estimated: 9 mL/min — ABNORMAL LOW (ref >60)
Glucose, Bld: 124 mg/dL — ABNORMAL HIGH (ref 70–99)
Potassium: 3.9 mmol/L (ref 3.5–5.1)
Sodium: 129 mmol/L — ABNORMAL LOW (ref 135–145)
Total Bilirubin: 0.9 mg/dL (ref 0.3–1.2)
Total Protein: 9.1 g/dL — ABNORMAL HIGH (ref 6.5–8.1)

## 2021-05-14 LAB — LACTIC ACID, PLASMA
Lactic Acid, Venous: 2.5 mmol/L (ref 0.5–1.9)
Lactic Acid, Venous: 2.1 mmol/L (ref 0.5–1.9)

## 2021-05-14 LAB — URINALYSIS, ROUTINE W REFLEX MICROSCOPIC
Bilirubin Urine: NEGATIVE
Glucose, UA: NEGATIVE mg/dL
Ketones, ur: NEGATIVE mg/dL
Nitrite: NEGATIVE
Protein, ur: 30 mg/dL — AB
Specific Gravity, Urine: 1.012 (ref 1.005–1.030)
WBC, UA: >50 WBC/hpf — ABNORMAL HIGH (ref 0–5)
pH: 5 (ref 5.0–8.0)

## 2021-05-14 LAB — CBC WITH DIFFERENTIAL/PLATELET
Abs Immature Granulocytes: 0.05 10*3/uL (ref 0.00–0.07)
Basophils Absolute: 0.1 10*3/uL (ref 0.0–0.1)
Basophils Relative: 1 %
Eosinophils Absolute: 0.2 10*3/uL (ref 0.0–0.5)
Eosinophils Relative: 1 %
HCT: 32 % — ABNORMAL LOW (ref 36.0–46.0)
Hemoglobin: 10.3 g/dL — ABNORMAL LOW (ref 12.0–15.0)
Immature Granulocytes: 0 %
Lymphocytes Relative: 28 %
Lymphs Abs: 3.4 10*3/uL (ref 0.7–4.0)
MCH: 31.7 pg (ref 26.0–34.0)
MCHC: 32.2 g/dL (ref 30.0–36.0)
MCV: 98.5 fL (ref 80.0–100.0)
Monocytes Absolute: 0.9 10*3/uL (ref 0.1–1.0)
Monocytes Relative: 7 %
Neutro Abs: 7.3 10*3/uL (ref 1.7–7.7)
Neutrophils Relative %: 63 %
Platelets: 701 10*3/uL — ABNORMAL HIGH (ref 150–400)
RBC: 3.25 MIL/uL — ABNORMAL LOW (ref 3.87–5.11)
RDW: 14 % (ref 11.5–15.5)
WBC: 11.9 10*3/uL — ABNORMAL HIGH (ref 4.0–10.5)
nRBC: 0 % (ref 0.0–0.2)

## 2021-05-14 LAB — RESP PANEL BY RT-PCR (FLU A&B, COVID) ARPGX2
Influenza A by PCR: NEGATIVE
Influenza B by PCR: NEGATIVE
SARS Coronavirus 2 by RT PCR: NEGATIVE

## 2021-05-14 LAB — C DIFFICILE QUICK SCREEN W PCR REFLEX
C Diff antigen: NEGATIVE
C Diff interpretation: NOT DETECTED
C Diff toxin: NEGATIVE

## 2021-05-14 IMAGING — CT CT ABD-PELV W/O CM
2 of 4 series · 16 of 46 positions shown, 18 images · non-contrast
Comparison: [DATE].

CLINICAL DATA: Acute generalized abdominal pain.

EXAM:
CT ABDOMEN AND PELVIS WITHOUT CONTRAST
TECHNIQUE: Multidetector CT imaging of the abdomen and pelvis was performed
following the standard protocol without IV contrast.

[Series 3: a/p w/o 5mm · axial · non-contrast · 0.88mm/px · z∈[-390,+70]mm · 13 of 100 slices shown, 15 images]
[im 4/100  soft-tissue]
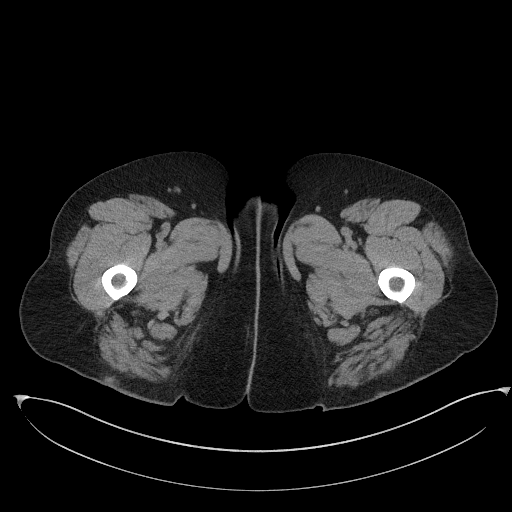
[im 4/100  bone]
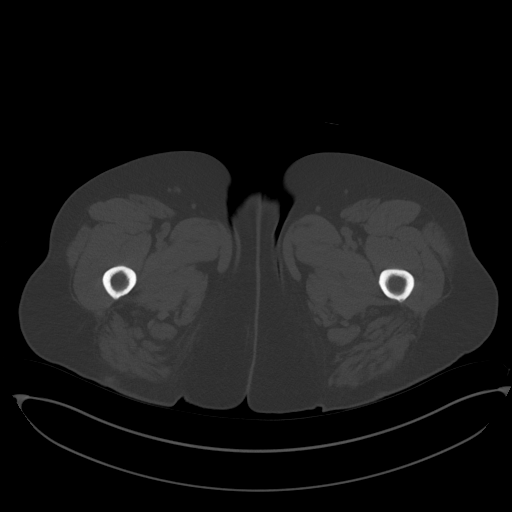
[im 12/100  soft-tissue]
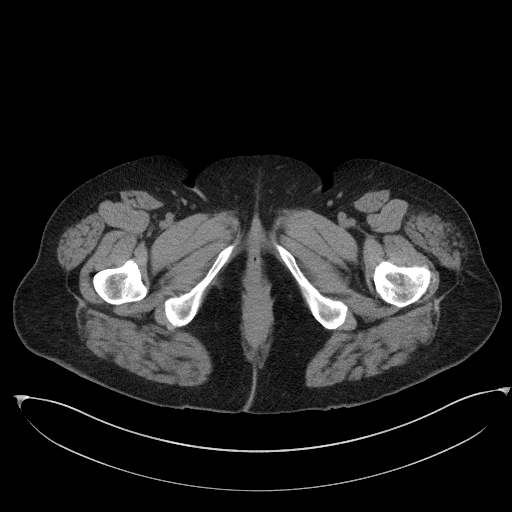
[im 20/100  soft-tissue]
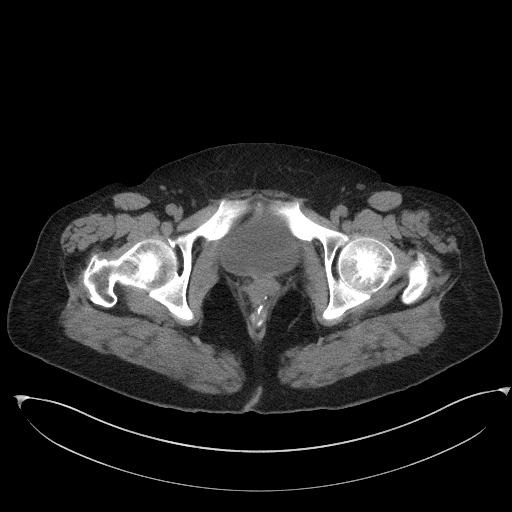
[im 28/100  soft-tissue]
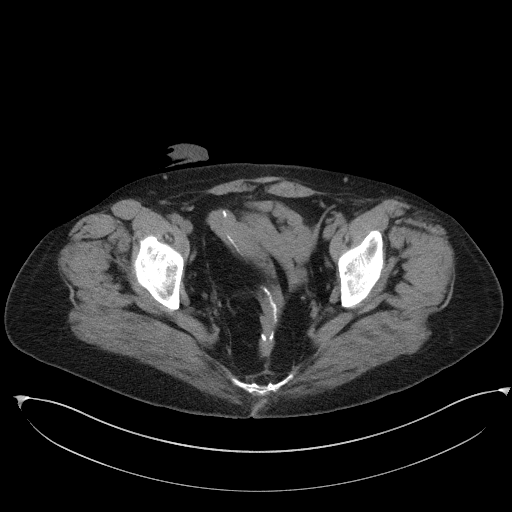
[im 36/100  soft-tissue]
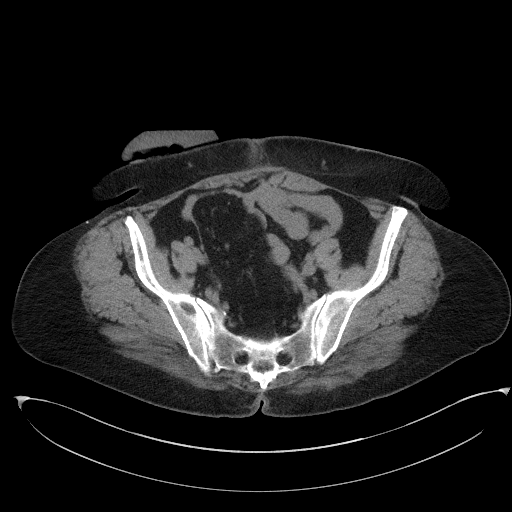
[im 44/100  soft-tissue]
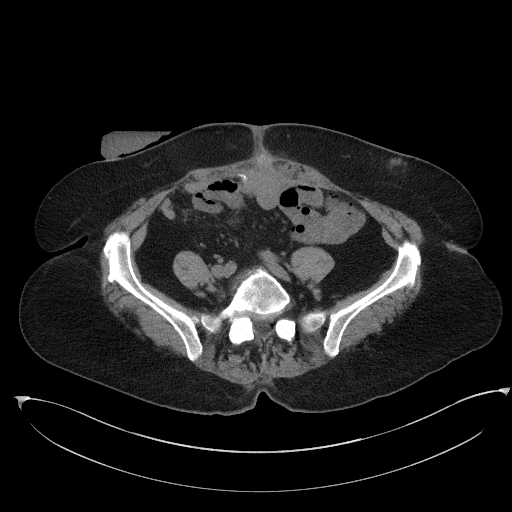
[im 52/100  soft-tissue]
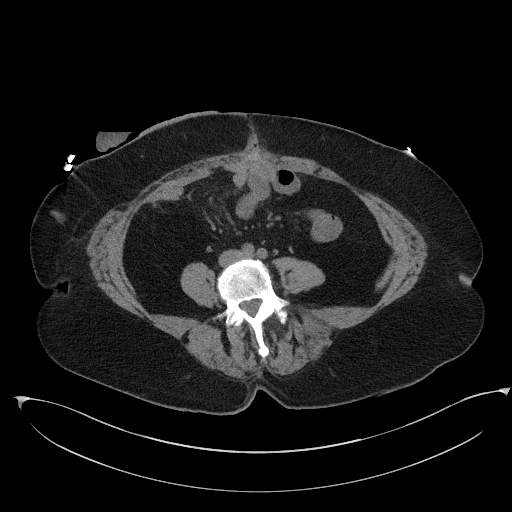
[im 56/100  soft-tissue]
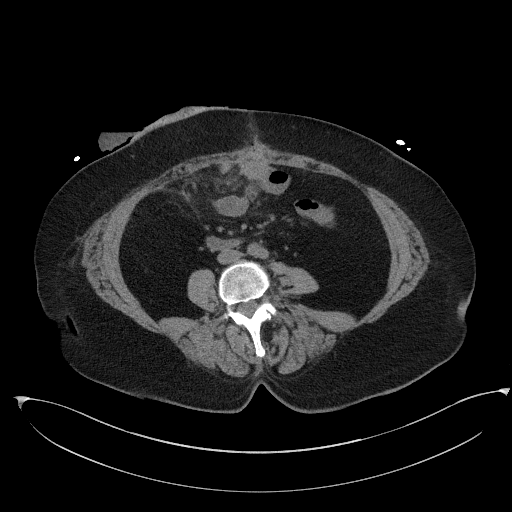
[im 64/100  soft-tissue]
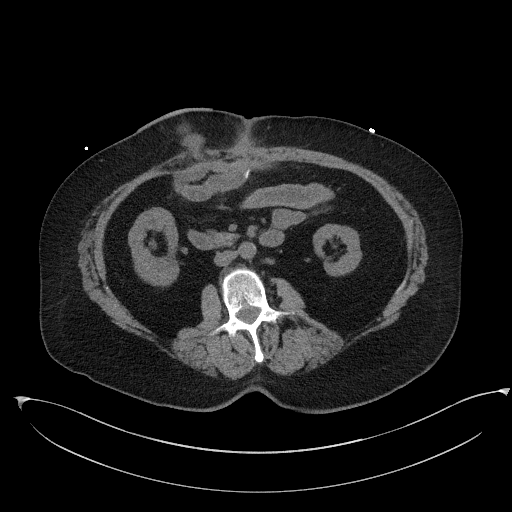
[im 64/100  bone]
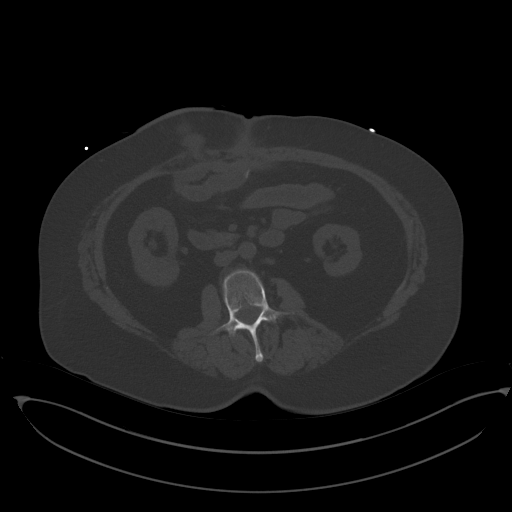
[im 72/100  soft-tissue]
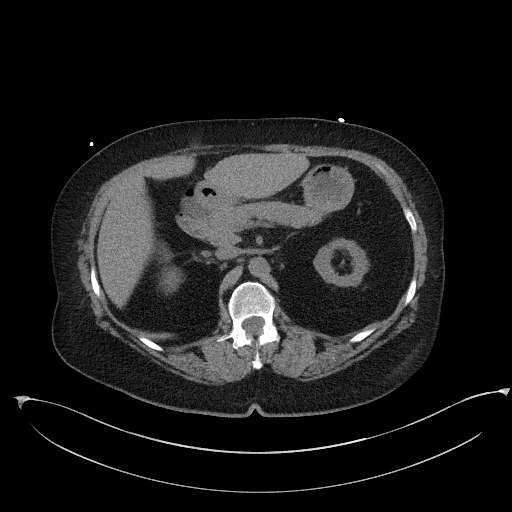
[im 80/100  soft-tissue]
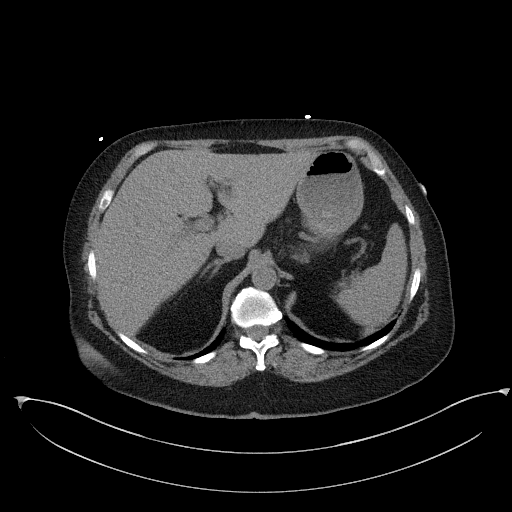
[im 88/100  soft-tissue]
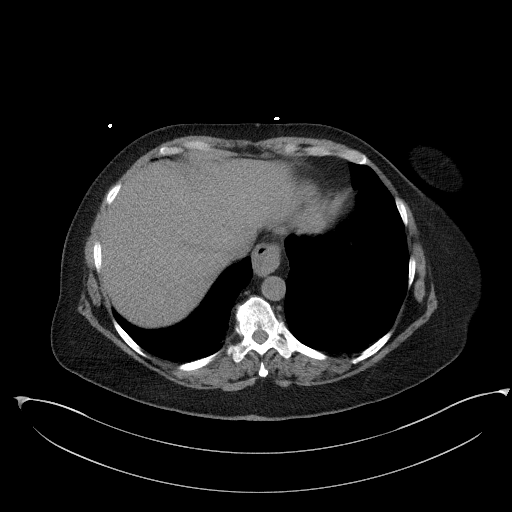
[im 96/100  soft-tissue]
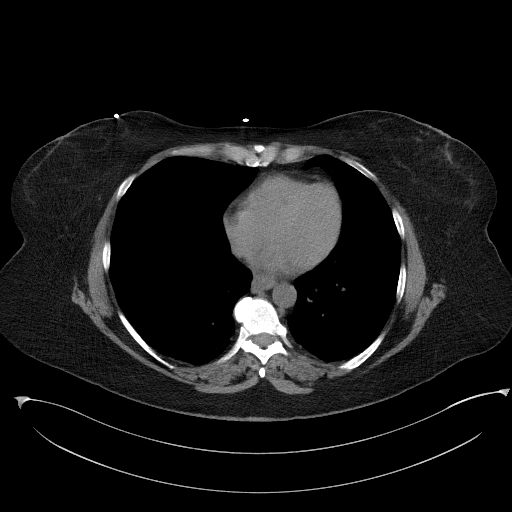

[Series 6: a/p w/o cor · coronal · non-contrast · 0.85mm/px · 3 of 154 slices shown]
[im 52/154  soft-tissue]
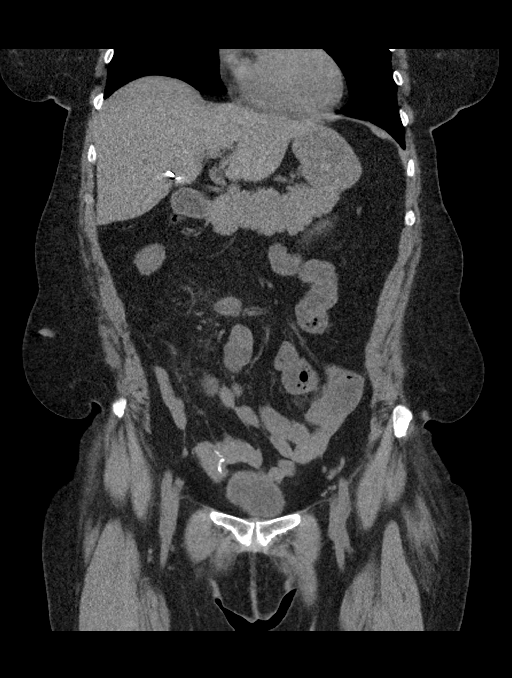
[im 69/154  soft-tissue]
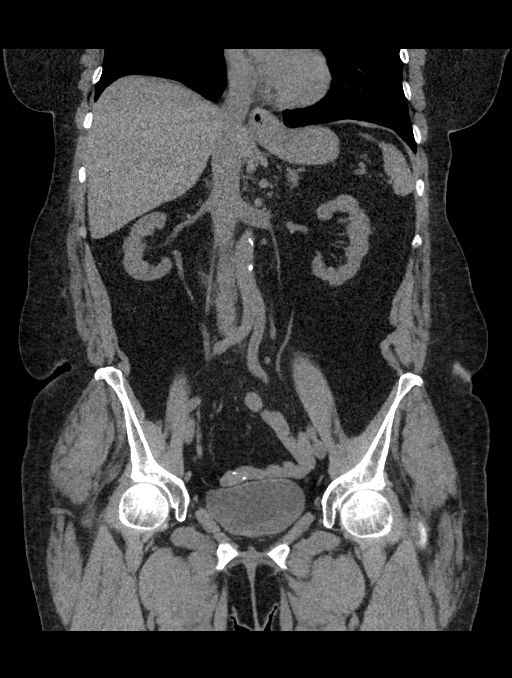
[im 86/154  soft-tissue]
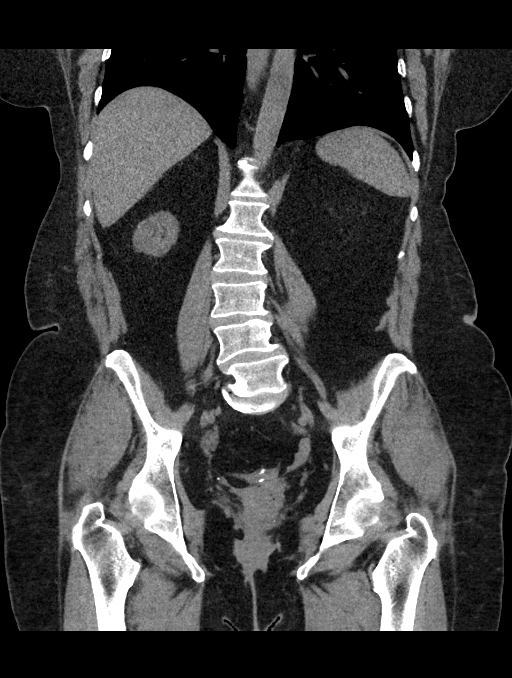

[16 of 46 positions shown; findings below may reference images not displayed]

FINDINGS: Lower chest: No acute abnormality.

Hepatobiliary: No focal liver abnormality is seen. Status post
cholecystectomy. No biliary dilatation.

Pancreas: Unremarkable. No pancreatic ductal dilatation or
surrounding inflammatory changes.

Spleen: Normal in size without focal abnormality.

Adrenals/Urinary Tract: Adrenal glands are unremarkable. Kidneys are
normal, without renal calculi, focal lesion, or hydronephrosis.
Bladder is unremarkable.

Stomach/Bowel: Stomach is unremarkable. Ileostomy is noted in the
right lower quadrant. Status post proctocolectomy. There is no
evidence of abnormal bowel dilatation. Possible contrast
extravasation or perforation noted along the anterior abdominal wall
in midline surgical incision on prior exam can not be evaluated
currently due to the lack of intravenous and oral contrast.

Vascular/Lymphatic: Aortic atherosclerosis. No enlarged abdominal or
pelvic lymph nodes.

Reproductive: Status post hysterectomy. No adnexal masses.

Other: No ascites or fluid collection is noted.

Musculoskeletal: No acute or significant osseous findings.
IMPRESSION: Status post proctocolectomy. Ileostomy is noted in right lower
quadrant. There is no evidence of bowel obstruction seen at this
time.

No definite acute abnormality is seen in the abdomen or pelvis at
this time.

Aortic Atherosclerosis ([27]-[27]).

## 2021-05-14 IMAGING — DX DG CHEST 2V
2 series · 2 of 2 positions shown · non-contrast
Comparison: [DATE]

CLINICAL DATA: Weakness/nausea for few days,,no energy ,hx
colostomy

EXAM:
CHEST - 2 VIEW

[x chest ap]
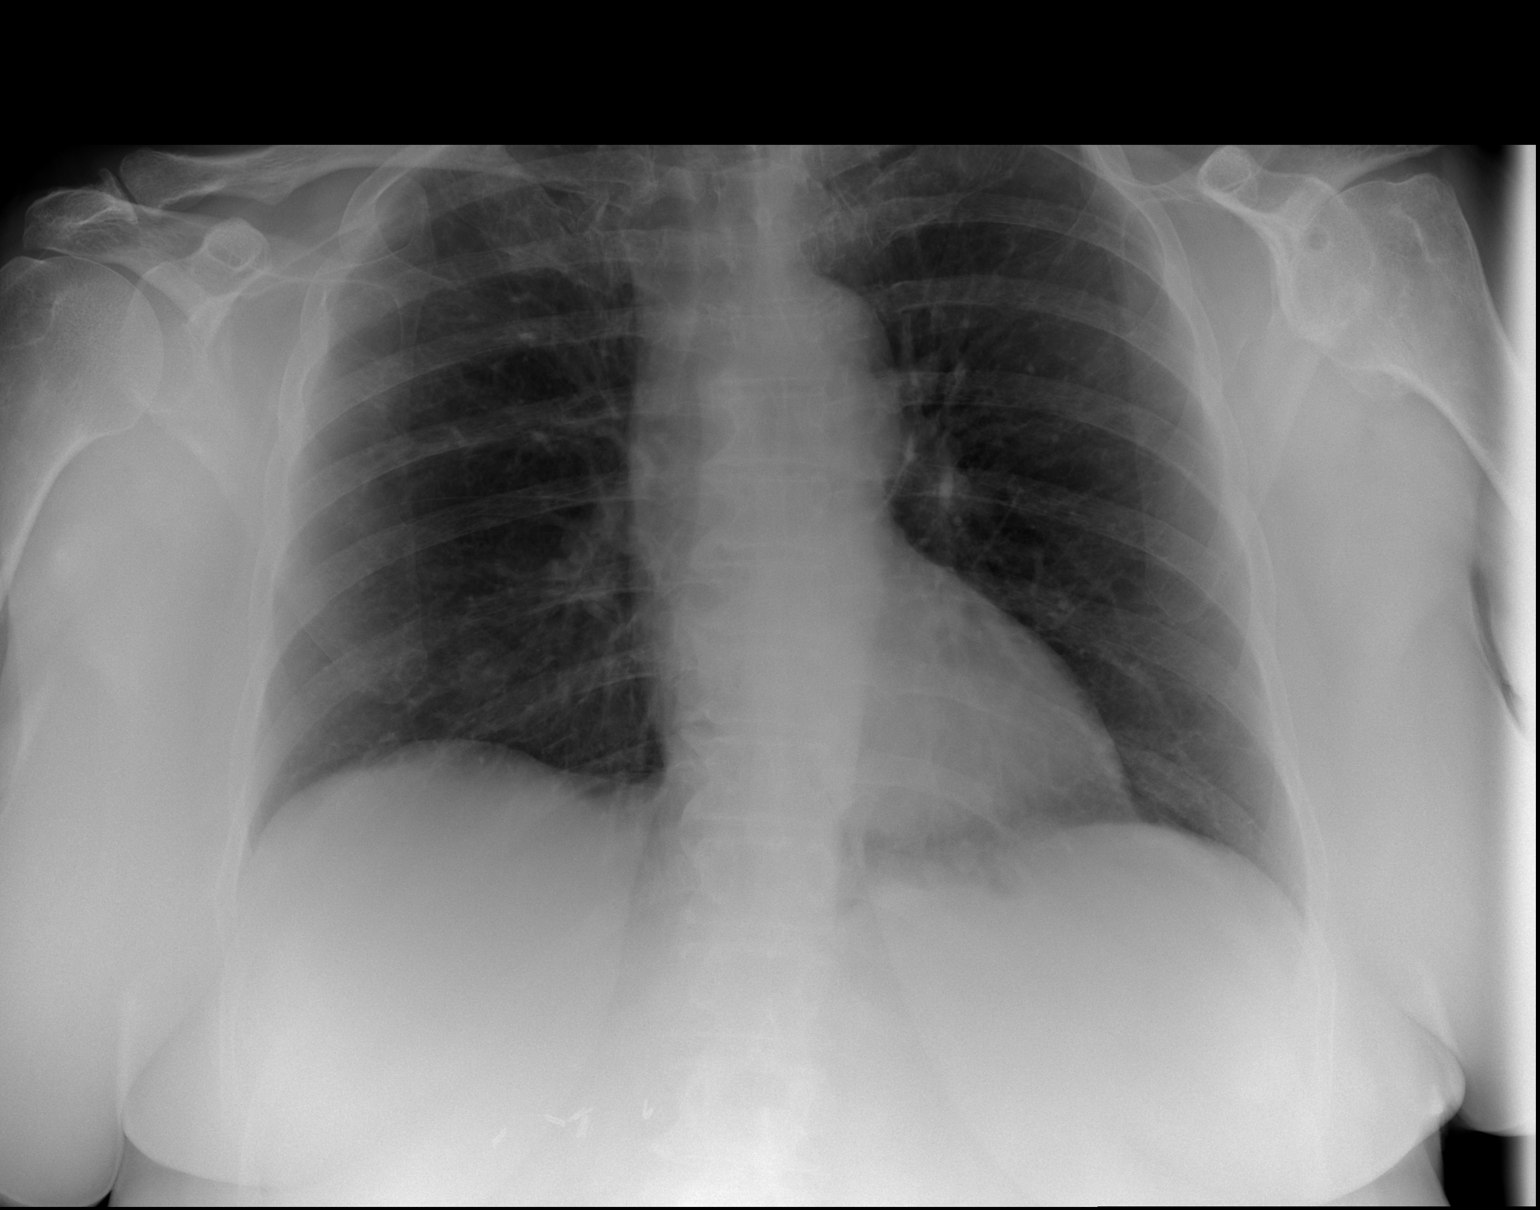

[w chest lat]
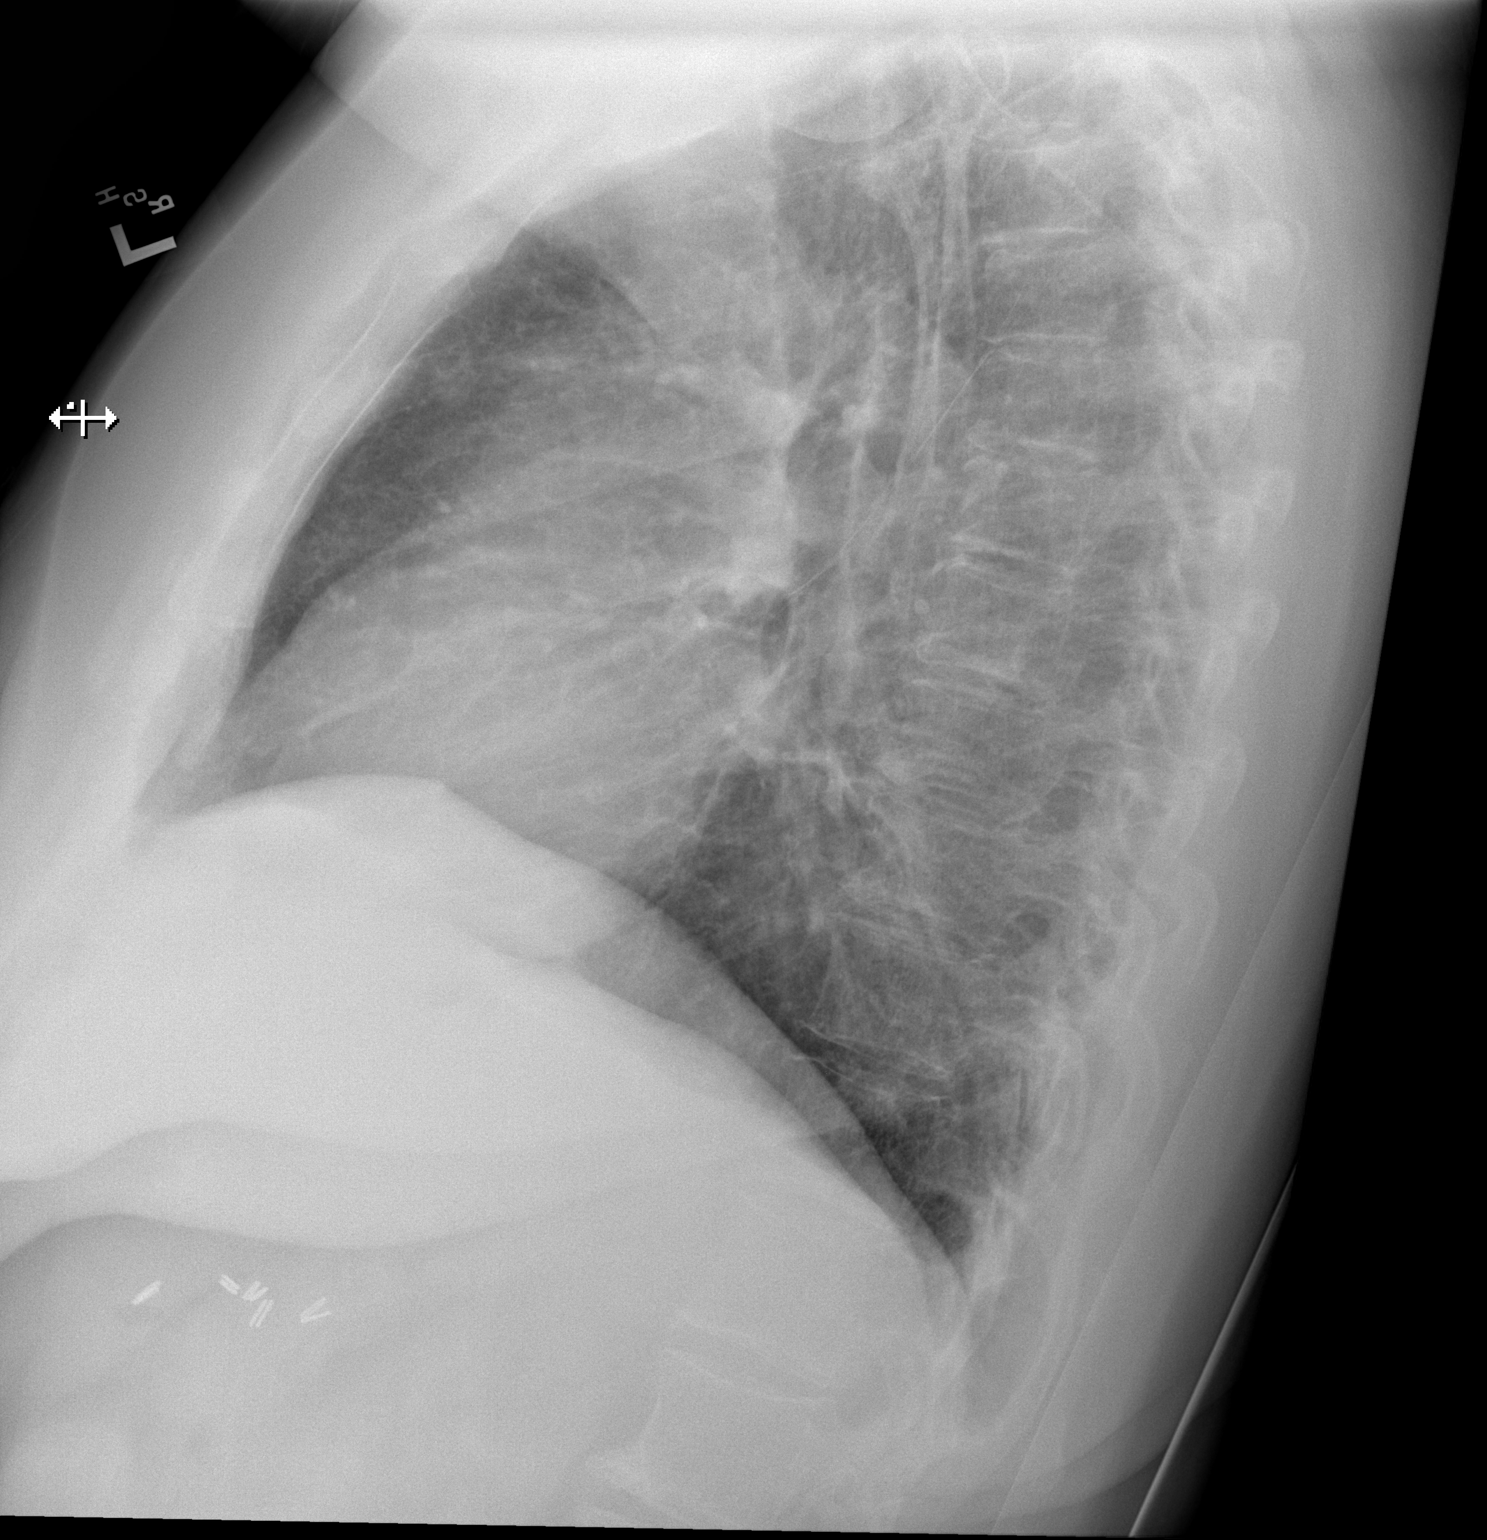

[2 of 2 positions shown; findings below may reference images not displayed]

FINDINGS: Lungs are clear.

Heart size and mediastinal contours are within normal limits.

No effusion.

Visualized bones unremarkable.  Surgical clips right upper quadrant.
IMPRESSION: No acute cardiopulmonary disease.

## 2021-05-14 MED ORDER — FAMOTIDINE 20 MG PO TABS: 20.0000 mg | ORAL_TABLET | Freq: Every day | ORAL | Status: DC | PRN

## 2021-05-14 MED ORDER — FOLIC ACID 1 MG PO TABS
1.0000 mg | ORAL_TABLET | Freq: Every day | ORAL | Status: DC
  Administered 2021-05-15 – 2021-05-16 (×2): 1 mg via ORAL
  Filled 2021-05-14 (×2): qty 1

## 2021-05-14 MED ORDER — METOPROLOL SUCCINATE ER 50 MG PO TB24
50.0000 mg | ORAL_TABLET | Freq: Every day | ORAL | Status: DC
  Administered 2021-05-14 – 2021-05-15 (×2): 50 mg via ORAL
  Filled 2021-05-14 (×2): qty 1

## 2021-05-14 MED ORDER — ACETAMINOPHEN 650 MG RE SUPP: 650.0000 mg | Freq: Four times a day (QID) | RECTAL | Status: DC | PRN

## 2021-05-14 MED ORDER — DULOXETINE HCL 60 MG PO CPEP
120.0000 mg | ORAL_CAPSULE | Freq: Every day | ORAL | Status: DC
  Administered 2021-05-14 – 2021-05-15 (×2): 120 mg via ORAL
  Filled 2021-05-14 (×2): qty 2

## 2021-05-14 MED ORDER — OXYCODONE HCL 5 MG PO TABS: 5.0000 mg | ORAL_TABLET | Freq: Every evening | ORAL | Status: DC | PRN

## 2021-05-14 MED ORDER — ALBUTEROL SULFATE (2.5 MG/3ML) 0.083% IN NEBU: 2.5000 mg | INHALATION_SOLUTION | Freq: Four times a day (QID) | RESPIRATORY_TRACT | Status: DC | PRN

## 2021-05-14 MED ORDER — ACETAMINOPHEN 500 MG PO TABS
1000.0000 mg | ORAL_TABLET | Freq: Once | ORAL | Status: AC
  Administered 2021-05-14: 1000 mg via ORAL
  Filled 2021-05-14: qty 2

## 2021-05-14 MED ORDER — LACTATED RINGERS IV BOLUS
1000.0000 mL | Freq: Once | INTRAVENOUS | Status: AC
  Administered 2021-05-14: 1000 mL via INTRAVENOUS

## 2021-05-14 MED ORDER — METOPROLOL TARTRATE 100 MG PO TABS
100.0000 mg | ORAL_TABLET | Freq: Every morning | ORAL | Status: DC
  Administered 2021-05-15 – 2021-05-16 (×2): 100 mg via ORAL
  Filled 2021-05-14 (×2): qty 1

## 2021-05-14 MED ORDER — ONDANSETRON HCL 4 MG PO TABS: 4.0000 mg | ORAL_TABLET | Freq: Four times a day (QID) | ORAL | Status: DC | PRN

## 2021-05-14 MED ORDER — SODIUM CHLORIDE 0.9 % IV BOLUS
1000.0000 mL | Freq: Once | INTRAVENOUS | Status: AC
  Administered 2021-05-14: 1000 mL via INTRAVENOUS

## 2021-05-14 MED ORDER — NYSTATIN 100000 UNIT/GM EX POWD: 1.0000 "application " | Freq: Every day | CUTANEOUS | Status: DC | PRN

## 2021-05-14 MED ORDER — ACETAMINOPHEN 325 MG PO TABS: 650.0000 mg | ORAL_TABLET | Freq: Four times a day (QID) | ORAL | Status: DC | PRN

## 2021-05-14 MED ORDER — FAMOTIDINE 20 MG PO TABS
20.0000 mg | ORAL_TABLET | Freq: Every day | ORAL | Status: DC
  Administered 2021-05-15 – 2021-05-16 (×2): 20 mg via ORAL
  Filled 2021-05-14 (×2): qty 1

## 2021-05-14 MED ORDER — SODIUM CHLORIDE 0.9 % IV SOLN: INTRAVENOUS | Status: DC

## 2021-05-14 MED ORDER — ENOXAPARIN SODIUM 30 MG/0.3ML IJ SOSY
30.0000 mg | PREFILLED_SYRINGE | INTRAMUSCULAR | Status: DC
  Administered 2021-05-14 – 2021-05-15 (×2): 30 mg via SUBCUTANEOUS
  Filled 2021-05-14 (×2): qty 0.3

## 2021-05-14 MED ORDER — ONDANSETRON HCL 4 MG/2ML IJ SOLN: 4.0000 mg | Freq: Four times a day (QID) | INTRAMUSCULAR | Status: DC | PRN

## 2021-05-14 NOTE — ED Provider Notes (Signed)
3:51 PM Signout from [place] PA-C at shift change. 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 difficile colitis, hypertension -- presents with nausea, vomiting, weakness.  Evaluation today demonstrates acute kidney injury superimposed on chronic kidney disease.  Baseline creatinine low twos, was 2.1 on 05/05/2021 at [facility].  Today creatinine is 1.94 with a BUN of 79.  Patient denies abdominal pain.  She has output through the ileostomy which does not seem fast or slow to her.  She feels very dehydrated.  She states that she cannot keep up with fluid intake.  CT done today shows postsurgical changes without any acute problems.  Overall patient looks well. Her abdomen is soft and nontender.  There is a small wound dehiscence at the superior aspect of the recent surgical site, patient states is improving.  Patient will need admission for IV fluids.  She is noted to be tachycardic into the 130s.  Urine pending.  4:21 PM Spoke with Dr. Tamala Julian of [facility] who will see patient. Additional liter NS ordered.   BP 125/78   Pulse (!) 127   Temp 98.4 F (36.9 C) (Oral)   Resp 13   SpO2 97%     Carlisle Cater, PA-C 05/14/21 1622    Carmin Muskrat, MD 05/14/21 2333

## 2021-05-14 NOTE — ED Triage Notes (Signed)
C/O N/V and weakness x 4 days. Stated recently discharge from hospital last Monday d/t wound dehiscence,

## 2021-05-14 NOTE — ED Provider Notes (Signed)
[facility] EMERGENCY DEPARTMENT Provider Note   CSN: [id number] Arrival date & time: 05/14/21  7793     History Chief Complaint  Patient presents with   Weakness    [name] is a 65 y.o. female.  HPI Patient is a 65 year old female with past medical history significant for ulcerative colitis s/p colectomy decades ago, she has mention of Crohn's disease in her chart although she states that she does not have Crohn's disease but rather ulcerative colitis, she has had most of her surgeries done at [facility].  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  States that she feels very weak and fatigued.  Has had decreased intake because of her nausea.  Denies any abnormal output through her ostomy.  Denies any other new or concerning symptoms.  States that she is not had any urinary frequency urgency or dysuria or hematuria.  Denies any flank or abdominal pain no chest pain or shortness of breath.    Past Medical History:  Diagnosis Date   Abdominal pain    Hx   Allergy    Anal stenosis    Anemia    Anxiety    Arthritis    Asthma    patient does not have inhaler   Blood in stool    Hx   Blood in urine    Hx   Blood transfusion without reported diagnosis    Cataract    CKD (chronic kidney disease) stage 3, GFR 30-59 ml/min (HCC)    Crohn's colitis, other complication (HCC)    De Quervain's tenosynovitis    Depression    Difficulty urinating    Hx   Easy bruising    Esophagitis    Fainting    History - resolved - due to dehydration   Fatigue    Hx   Fibroid    Hx   Gastric polyp    GERD (gastroesophageal reflux disease)    Hearing loss    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 aid -  80% loss   Hemorrhoids, external    Hemorrhoids, internal    Herpes, genital    vaginal treated 07/05/12 and pt states is resolved   History of cervical dysplasia    History of small bowel obstruction    Hyperlipidemia    currently no meds   Hyperparathyroidism    Hypertension    Hypokalemia    Hx   IBD (inflammatory bowel disease)    initially colectomy for suspected UC, 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   Obesity    Ovarian cyst    Poor dental hygiene    Pulmonary nodule, right    right upper lobe   Rectal bleeding    Hx   Rectal pain    Hx   Renal insufficiency    CKD - stage 3   RLS (restless legs syndrome)    no meds   Tooth infection 11/2016   right low   Ulcerative colitis    Visual disturbance    wears glasses   Weakness generalized    Hx - patient denies generalized weakness   Wears dentures    upper only    Patient  Active Problem List   Diagnosis Date Noted   Chronic pain of left knee 03/10/2021   Depression, major, single episode, severe ([place]) 12/18/2020   Hyperlipidemia associated with type 2 diabetes mellitus ([place]) 12/18/2020   Severe sepsis ([place]) 12/07/2020   Acute pyelonephritis 12/07/2020   Closed fracture of left tibial plateau 10/30/2020   Screening for osteoporosis 10/30/2020   Diarrhea with dehydration 09/21/2020   Crohn's disease of jejunum ([place]) 08/23/2020   Chronic diarrhea 46/27/0350   Metabolic acidosis 09/38/1829   ARF (acute renal failure) ([place]) 08/23/2020   Acute renal failure superimposed on stage 4 chronic kidney disease ([place]) 08/22/2020   Hyperglycemia 02/26/2020   Immunologic deficiency syndrome ([place]) 02/26/2020   Prothrombin gene mutation ([place]) 02/26/2020   Acute pain of left shoulder 06/04/2019   Pes anserine bursitis 04/12/2018   Pain of right thumb 04/12/2018   Low back pain 06/21/2017   Acute right-sided low back pain with right-sided sciatica 06/21/2017   Gout 06/21/2017   Superficial  thrombophlebitis of right upper extremity 93/71/6967   Acute basilic vein thrombosis, right 02/16/2017   Symptomatic anemia 01/15/2017   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   Rectal pain 12/01/2015   IBD (inflammatory bowel disease) 10/24/2015   Anal stricture 10/24/2015   Gout of foot 10/29/2014   Ovarian cyst, right 06/12/2014   Acute on chronic renal failure ([place]) 04/02/2013   Hydrosalpinx 02/14/2013   Hyperparathyroidism ([place]) 09/18/2012   HSV (herpes simplex virus) anogenital infection 06/28/2012   CKD (chronic kidney disease), stage IV ([place]) 06/17/2012   LLQ abdominal pain 06/22/2011   RESTLESS LEGS SYNDROME 08/05/2009   PULMONARY NODULE, RIGHT UPPER LOBE 02/28/2009   TOBACCO ABUSE, HX OF 02/11/2009   PAIN IN JOINT, MULTIPLE SITES 02/04/2009   Nonspecific (abnormal) findings on radiological and other examination of body structure 12/23/2008   CT, CHEST, ABNORMAL 12/23/2008   HEMANGIOMA SIMPLEX 12/16/2008   Pain in joint, pelvic region and thigh 12/16/2008   SKIN RASH 12/16/2008   TINEA CORPORIS 04/18/2008   LATERAL EPICONDYLITIS, RIGHT 04/18/2008   Morbid obesity ([place]) 02/05/2008   ANEMIA 02/05/2008   ANAL STENOSIS 02/05/2008   Stool culture positive for Clostridioides difficile 02/05/2008   Personal history of other endocrine, metabolic, and immunity disorders 02/05/2008   Personal history of other diseases of digestive system 02/05/2008   SYNCOPE AND COLLAPSE 02/02/2008   Hyponatremia 12/15/2007   Dehydration 12/15/2007   HYPOKALEMIA 12/15/2007   ANEMIA-IRON DEFICIENCY 12/15/2007   LEUKOCYTOSIS 12/15/2007   Anxiety state 12/15/2007   Disorder resulting from impaired renal function 11/16/2007   TINNITUS NOS 05/25/2007   DE QUERVAIN'S TENOSYNOVITIS 05/25/2007   Hyperlipidemia LDL goal <100 10/23/2006   Depression 10/23/2006   Essential hypertension 10/23/2006   GERD 10/23/2006   Gastro-esophageal reflux disease without esophagitis 10/23/2006   Ulcerative colitis  without complications ([place]) 89/38/1017   GASTRIC POLYP 11/16/2001   GERD with esophagitis 11/16/2001    Past Surgical History:  Procedure Laterality Date   ANAL DILATION     CERVICAL BIOPSY  W/ LOOP ELECTRODE EXCISION     CHOLECYSTECTOMY     COLONOSCOPY     [name]   fatty tumor removed from back     X 2   HEMORRHOID SURGERY     ILEOSTOMY CLOSURE     RESTORATIVE PROCTOCOLECTOMY     with insertion of ileoanal J Pouch with loop ileostomy   SHOULDER ARTHROSCOPY WITH CAPSULORRHAPHY Left 06/14/2019   Procedure: LEFT SHOULDER ARTHRSCOPIC REPAIR OF BONY BANKART FRACTURE;  Surgeon: Tamera Punt,  Larkin Ina, MD;  Location: [facility];  Service: Orthopedics;  Laterality: Left;  REQUEST 40 MINUTE   SIGMOIDOSCOPY     TOTAL ABDOMINAL HYSTERECTOMY  1998   TAH/LSO   TUBAL LIGATION     UPPER GASTROINTESTINAL ENDOSCOPY     [name]     OB History     Gravida  4   Para  2   Term  2   Preterm      AB  2   Living  2      SAB  2   IAB      Ectopic      Multiple      Live Births  2           Family History  Problem Relation Age of Onset   Ulcerative colitis Father    Hypertension Father    Heart attack Father    Hypertension Mother    Heart disease Mother        s/p pci   Ulcerative colitis Daughter    Irritable bowel syndrome Other        grandchildren   Diabetes Sister    Cancer Sister        uterine   Cancer Maternal Uncle        LUNG   Colon cancer Neg Hx    Esophageal cancer Neg Hx    Stomach cancer Neg Hx    Rectal cancer Neg Hx     Social History   Tobacco Use   Smoking status: Former    Packs/day: 1.00    Years: 4.00    Pack years: 4.00    Types: Cigarettes    Quit date: 05/21/1979    Years since quitting: 42.0   Smokeless tobacco: Never  Vaping Use   Vaping Use: Never used  Substance Use Topics   Alcohol use: No    Alcohol/week: 0.0 standard drinks   Drug use: No    Home Medications Prior to Admission medications   Medication Sig Start Date End  Date Taking? Authorizing Provider  acetaminophen (TYLENOL) 500 MG tablet Take 500-1,000 mg by mouth at bedtime.   Yes [provider]  allopurinol (ZYLOPRIM) 300 MG tablet TAKE 1 TABLET BY MOUTH EVERY DAY Patient taking differently: Take 300 mg by mouth at bedtime. 02/13/21  Yes Ann Held, DO  Calcium Carbonate Antacid (TUMS EXTRA STRENGTH 750 PO) Take 1-2 tablets by mouth See admin instructions. Chew 1-2 tablets by mouth as needed for heartburn or reflux   Yes [provider]  DULoxetine (CYMBALTA) 60 MG capsule Take 2 capsules (120 mg total) by mouth daily. Patient taking differently: Take 120 mg by mouth at bedtime. 11/05/20  Yes Lowne Chase, Yvonne R, DO  ENTYVIO 300 MG injection Inject 300 mg into the vein every 28 (twenty-eight) days.   Yes [provider]  famotidine (PEPCID) 20 MG tablet TAKE 1 TABLET BY MOUTH TWICE A DAY Patient taking differently: Take 20 mg by mouth daily as needed for heartburn or indigestion. 04/01/21  Yes Armbruster, Carlota Raspberry, MD  gabapentin (NEURONTIN) 100 MG capsule TAKE 1 CAPSULE BY MOUTH THREE TIMES A DAY Patient taking differently: Take 100 mg by mouth 3 (three) times daily. 02/13/21  Yes Roma Schanz R, DO  metoprolol succinate (TOPROL-XL) 100 MG 24 hr tablet Take 50-100 mg by mouth See admin instructions. Take 100 mg by mouth in the morning and 50 mg at bedtime   Yes [provider]  nystatin (MYCOSTATIN/NYSTOP) powder Apply 1 application topically daily as needed for rash. 05/06/21  Yes [provider]  oxyCODONE (OXY IR/ROXICODONE) 5 MG immediate release tablet Take 5 mg by mouth at bedtime as needed for moderate pain. 04/27/21  Yes [provider]  Vitamin D, Ergocalciferol, (DRISDOL) 1.25 MG (50000 UNIT) CAPS capsule Take 50,000 Units by mouth every 30 (thirty) days.   Yes [provider]  folic acid (FOLVITE) 1 MG tablet Take 2 tablets (2 mg total) by mouth daily. Patient taking  differently: Take 1-2 mg by mouth daily. 05/26/17   [name], MD  NONFORMULARY OR COMPOUNDED ITEM NerveE for neuropathy Patient not taking: No sig reported 03/10/21   Carollee Herter, Alferd Apa, DO  ondansetron (ZOFRAN) 4 MG tablet Take 1 tablet (4 mg total) by mouth every 6 (six) hours as needed for nausea. Patient not taking: No sig reported 12/10/20   Raiford Noble Latif, DO    Allergies    Sulfa antibiotics, Morphine, and Sulfonamide derivatives  Review of Systems   Review of Systems  Constitutional:  Positive for fatigue. Negative for chills and fever.  HENT:  Negative for congestion.   Eyes:  Negative for pain.  Respiratory:  Negative for cough and shortness of breath.   Cardiovascular:  Negative for chest pain and leg swelling.  Gastrointestinal:  Positive for nausea and vomiting. Negative for abdominal pain.  Genitourinary:  Negative for dysuria.  Musculoskeletal:  Negative for myalgias.  Skin:  Negative for rash.  Neurological:  Negative for dizziness and headaches.   Physical Exam Updated Vital Signs BP 128/77   Pulse (!) 127   Temp 98.4 F (36.9 C) (Oral)   Resp (!) 26   SpO2 100%   Physical Exam Vitals and nursing note reviewed.  Constitutional:      General: She is not in acute distress.    Comments: Nontoxic-appearing 65 year old female in no acute distress, able answer questions appropriately follow commands.  HENT:     Head: Normocephalic and atraumatic.     Nose: Nose normal.  Eyes:     General: No scleral icterus. Cardiovascular:     Rate and Rhythm: Regular rhythm. Tachycardia present.     Pulses: Normal pulses.     Heart sounds: Normal heart sounds.  Pulmonary:     Effort: Pulmonary effort is normal. No respiratory distress.     Breath sounds: No wheezing.  Abdominal:     Palpations: Abdomen is soft.     Tenderness: There is no abdominal tenderness.     Comments: Abdomen is soft there is a midline surgical scar with some dehiscence and some  drainage from the wound.  Ileostomy bag present on the right side of the abdomen.  No abdominal tenderness.  Musculoskeletal:     Cervical back: Normal range of motion.     Right lower leg: No edema.     Left lower leg: No edema.  Skin:    General: Skin is warm and dry.     Capillary Refill: Capillary refill takes less than 2 seconds.  Neurological:     Mental Status: She is alert. Mental status is at baseline.  Psychiatric:        Mood and Affect: Mood normal.        Behavior: Behavior normal.        ED Results / Procedures / Treatments   Labs (all labs ordered are listed, but only abnormal results are displayed) Labs Reviewed  COMPREHENSIVE METABOLIC PANEL -  Abnormal; Notable for the following components:      Result Value   Sodium 129 (*)    Chloride 85 (*)    Glucose, Bld 124 (*)    BUN 79 (*)    Creatinine, Ser 4.94 (*)    Total Protein 9.1 (*)    Albumin 3.4 (*)    AST 51 (*)    ALT 71 (*)    Alkaline Phosphatase 207 (*)    GFR, Estimated 9 (*)    Anion gap 20 (*)    All other components within normal limits  CBC WITH DIFFERENTIAL/PLATELET - Abnormal; Notable for the following components:   WBC 11.9 (*)    RBC 3.25 (*)    Hemoglobin 10.3 (*)    HCT 32.0 (*)    Platelets 701 (*)    All other components within normal limits  LACTIC ACID, PLASMA - Abnormal; Notable for the following components:   Lactic Acid, Venous 2.5 (*)    All other components within normal limits  LACTIC ACID, PLASMA - Abnormal; Notable for the following components:   Lactic Acid, Venous 2.1 (*)    All other components within normal limits  RESP PANEL BY RT-PCR (FLU A&B, COVID) ARPGX2  CULTURE, BLOOD (ROUTINE X 2)  CULTURE, BLOOD (ROUTINE X 2)    EKG EKG Interpretation  Date/Time:  Thursday May 14 2021 09:02:44 EDT Ventricular Rate:  139 PR Interval:  120 QRS Duration: 100 QT Interval:  288 QTC Calculation: 438 R Axis:   -34 Text Interpretation: Sinus tachycardia Left  axis deviation Left ventricular hypertrophy ( R in aVL , Cornell product , Romhilt-Estes ) Nonspecific ST abnormality Abnormal ECG When compared to prior, faster rate. No STEMI Confirmed by Antony Blackbird [phone]) on 05/14/2021 11:25:54 AM  Radiology DG Chest 2 View  Result Date: 05/14/2021 CLINICAL DATA:  Weakness/nausea for few days,,no energy ,hx colostomy EXAM: CHEST - 2 VIEW COMPARISON:  12/07/2020 FINDINGS: Lungs are clear. Heart size and mediastinal contours are within normal limits. No effusion. Visualized bones unremarkable.  Surgical clips right upper quadrant. IMPRESSION: No acute cardiopulmonary disease. Electronically Signed   By: Lucrezia Europe M.D.   On: 05/14/2021 09:52    Procedures Procedures   Medications Ordered in ED Medications  0.9 %  sodium chloride infusion (has no administration in time range)  lactated ringers bolus 1,000 mL (0 mLs Intravenous Stopped 05/14/21 1453)  acetaminophen (TYLENOL) tablet 1,000 mg (1,000 mg Oral Given 05/14/21 1321)    ED Course  I have reviewed the triage vital signs and the nursing notes.  Pertinent labs & imaging results that were available during my care of the patient were reviewed by me and considered in my medical decision making (see chart for details).    MDM Rules/Calculators/A&P                           Patient is a 65 year old female history of multiple abdominal surgeries complaining of no abdominal pain today but tachycardic with dry oral mucosal membranes, feels very dehydrated decreased intake of fluids and food because of nausea and vomiting denies any abdominal pain  CMP notable for significant elevation in creatinine and BUN.  When she was discharged from [facility] her creatinine was 2.1 it is nearly 5 today with elevated BUN consistent with prerenal AKI.  Mild hyponatremia and hypochloremia with anion gap likely due to some lactic acidosis.  CBC with mild leukocytosis likely due to frequent emesis.  COVID and flu within  negative.  Blood cultures obtained, lactic downtrending after fluid.  She understands need for admission  Chest x-ray unremarkable  CT scan pending at this time..  EKG nonischemic with no acute maladies it is tachycardic which is unchanged from prior.  Patient care signed off to [facility] at shift change to follow-up on CT scan.  Anticipate need for medicine admission for AKI.  Final Clinical Impression(s) / ED Diagnoses Final diagnoses:  AKI (acute kidney injury) ([place])  Weakness  Dehydration    Rx / DC Orders ED Discharge Orders     None        Tedd Sias, [place] 05/14/21 1512    Tegeler, Gwenyth Allegra, MD 05/14/21 [phone]

## 2021-05-14 NOTE — ED Notes (Signed)
Patient transported to CT 

## 2021-05-14 NOTE — Progress Notes (Signed)
New Admission Note:   Arrival Method: stretcher  Mental Orientation: Alert and oriented  Telemetry:Box 15  Assessment: Completed Skin: wound on abdomen Right lower ileostomy  WI:OMBT AC  Pain: 0/10  Tubes:none  Safety Measures: Safety Fall Prevention Plan has been given, discussed and signed Admission: Completed [facility] Orientation: Patient has been orientated to the room, unit and staff.  Family: None   Orders have been reviewed and implemented. Will continue to monitor the patient. Call light has been placed within reach and bed alarm has been activated.   Delmus Warwick RN [facility] Phone: [phone]

## 2021-05-14 NOTE — ED Notes (Signed)
Patient voided in bed, purewick not connected to suction. Bed saturated and overflowed to floor. Patient cleaned up.

## 2021-05-14 NOTE — ED Provider Notes (Signed)
Emergency Medicine Provider Triage Evaluation Note  [name] , a 65 y.o. female  was evaluated in triage.  Pt complains of generalized weakness.  She has had nausea, 2 episodes of emesis.  No fevers at home but feeling generally weak.  She had an ileostomy performed 8/18 with fistula development.  She had general wound dehiscence and was seen by the surgeon 4 days ago.  States that the wound is tender, there is been some generalized erythema as well..  Review of Systems  Positive: Generalized weakness, nausea, vomiting Negative: Fevers, decreased output, chest pain  Physical Exam  BP 116/81 (BP Location: Right Arm)   Pulse (!) 136   Temp 98.4 F (36.9 C) (Oral)   Resp 18   SpO2 97%  Gen:   Awake, no distress.  Weak appearing Resp:  Normal effort  MSK:   Moves extremities without difficulty  Other:  Patient is tachycardic.  Ileostomy bag with brown stool.  Generalized weakness but not particularly tender to the abdomen.    Medical Decision Making  Medically screening exam initiated at 9:11 AM.  Appropriate orders placed.  [name] was informed that the remainder of the evaluation will be completed by another provider, this initial triage assessment does not replace that evaluation, and the importance of remaining in the ED until their evaluation is complete.  Patient is tachycardic but not febrile.  Wound visualization deferred for when seen and back.  Concern for possible sepsis.  Will initiate work-up.   Sherrill Raring, PA-C 05/14/21 0913    Tegeler, Gwenyth Allegra, MD 05/14/21 [phone]

## 2021-05-14 NOTE — Progress Notes (Signed)
This RN suppose to do IN and out cath to pt. When in room pt told this RN that she was able to pee. Her depends is wet. Did Bladder scan on pt. It showed 152cc of urine retaining

## 2021-05-14 NOTE — H&P (Addendum)
History and Physical    [name] KCL:[id number] DOB: 11/16/1955 DOA: 05/14/2021  Referring MD/NP/PA: Carlisle Cater, PA-C PCP: Ann Held, DO  Patient coming from: Home  Chief Complaint: Weakness  I have personally briefly reviewed patient's old medical records in [place]   HPI: [name] is a 65 y.o. female with medical history significant of hypertension, hyperlipidemia, documented as Crohn's disease(patient reports ulcerative colitis) s/p ileal pouch-anal anastomosis in 1998 who underwent EUA, pouchoscopy, diverting loop ileostomy d/t pouchitis on 8/18 by Dr. Drue Flirt at [facility], and  was readmitted to [facility] 8/26-8/31 due to wound dehiscence with concern for fistula who presents with complaints of weakness.  The patient was managed nonoperatively. Since leaving the hospital patient reports that she has not been able to keep up with her fluid intake.  While she is sitting and laying she feels fine, but whenever she tries to get up and stand and do anything she gets lightheaded and feels as though she could pass out.  Associated symptoms include generalized malaise, decreased urine output, intermittent nausea, and vomiting.  She still reports having some drainage from her abdominal wound, but reports that it  is healing.  Patient denies having any fever, shortness of breath, abdominal pain, dysuria, or diarrhea symptoms.  She was supposed to get her Entyvio injection on 18th of last month, but has not had it yet due to being in the hospital.  ED Course: On admission into the emergency department patient was seen to be afebrile with heart rates elevated up to 136 with respirations 10-26, and all other vital signs maintained.  Labs significant for WBC 11.9, hemoglobin 10.3 platelets 701, sodium 129, chloride 85, BUN 79, creatinine 4.94, anion gap 20, alkaline phosphatase 207, AST 51, ALT 71, total protein 9.1, lactic acid 2.5->2.1.  Influenza and COVID-19 screening was  negative.  Blood cultures have been obtained and urinalysis was pending.  Patient was given acetaminophen, 2 L of IV fluids, and started on a rate at 125 mL/h  Review of Systems  Constitutional:  Positive for malaise/fatigue. Negative for fever.  Eyes:  Negative for photophobia and pain.  Respiratory:  Negative for cough and shortness of breath.   Cardiovascular:  Negative for chest pain and leg swelling.  Gastrointestinal:  Positive for nausea and vomiting. Negative for abdominal pain.  Genitourinary:  Negative for dysuria and frequency.  Musculoskeletal:  Negative for falls and myalgias.  Neurological:  Positive for dizziness and weakness. Negative for loss of consciousness.  Psychiatric/Behavioral:  Negative for memory loss and substance abuse.    Past Medical History:  Diagnosis Date   Abdominal pain    Hx   Allergy    Anal stenosis    Anemia    Anxiety    Arthritis    Asthma    patient does not have inhaler   Blood in stool    Hx   Blood in urine    Hx   Blood transfusion without reported diagnosis    Cataract    CKD (chronic kidney disease) stage 3, GFR 30-59 ml/min (HCC)    Crohn's colitis, other complication (HCC)    De Quervain's tenosynovitis    Depression    Difficulty urinating    Hx   Easy bruising    Esophagitis    Fainting    History - resolved - due to dehydration   Fatigue    Hx   Fibroid    Hx   Gastric  polyp    GERD (gastroesophageal reflux disease)    Hearing loss    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   Hemorrhoids, external    Hemorrhoids, internal    Herpes, genital    vaginal treated 07/05/12 and pt states is resolved   History of cervical dysplasia    History of small bowel obstruction    Hyperlipidemia    currently no meds   Hyperparathyroidism    Hypertension    Hypokalemia    Hx   IBD (inflammatory bowel disease)    initially colectomy for suspected UC, 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   Obesity     Ovarian cyst    Poor dental hygiene    Pulmonary nodule, right    right upper lobe   Rectal bleeding    Hx   Rectal pain    Hx   Renal insufficiency    CKD - stage 3   RLS (restless legs syndrome)    no meds   Tooth infection 11/2016   right low   Ulcerative colitis    Visual disturbance    wears glasses   Weakness generalized    Hx - patient denies generalized weakness   Wears dentures    upper only    Past Surgical History:  Procedure Laterality Date   ANAL DILATION     CERVICAL BIOPSY  W/ LOOP ELECTRODE EXCISION     CHOLECYSTECTOMY     COLONOSCOPY     [name]   fatty tumor removed from back     X 2   HEMORRHOID SURGERY     ILEOSTOMY CLOSURE     RESTORATIVE PROCTOCOLECTOMY     with insertion of ileoanal J Pouch with loop ileostomy   SHOULDER ARTHROSCOPY WITH CAPSULORRHAPHY Left 06/14/2019   Procedure: LEFT SHOULDER ARTHRSCOPIC REPAIR OF BONY BANKART FRACTURE;  Surgeon: Tania Ade, MD;  Location: [facility];  Service: Orthopedics;  Laterality: Left;  REQUEST 27 MINUTE   SIGMOIDOSCOPY     TOTAL ABDOMINAL HYSTERECTOMY  1998   TAH/LSO   TUBAL LIGATION     UPPER GASTROINTESTINAL ENDOSCOPY     Olevia Perches     reports that she quit smoking about 42 years ago. Her smoking use included cigarettes. She has a 4.00 pack-year smoking history. She has never used smokeless tobacco. She reports that she does not drink alcohol and does not use drugs.  Allergies  Allergen Reactions   Sulfa Antibiotics Hives, Itching, Rash and Swelling   Morphine Other (See Comments)    "Gives me crazy dreams" (delusions, also)   Sulfonamide Derivatives Hives, Itching and Swelling    Family History  Problem Relation Age of Onset   Ulcerative colitis Father    Hypertension Father    Heart attack Father    Hypertension Mother    Heart disease Mother        s/p pci   Ulcerative colitis Daughter    Irritable bowel syndrome Other        grandchildren   Diabetes Sister    Cancer Sister         uterine   Cancer Maternal Uncle        LUNG   Colon cancer Neg Hx    Esophageal cancer Neg Hx    Stomach cancer Neg Hx    Rectal cancer Neg Hx     Prior to Admission medications   Medication Sig Start Date End Date Taking? Authorizing Provider  acetaminophen (  TYLENOL) 500 MG tablet Take 500-1,000 mg by mouth at bedtime.   Yes [provider]  allopurinol (ZYLOPRIM) 300 MG tablet TAKE 1 TABLET BY MOUTH EVERY DAY Patient taking differently: Take 300 mg by mouth at bedtime. 02/13/21  Yes Ann Held, DO  Calcium Carbonate Antacid (TUMS EXTRA STRENGTH 750 PO) Take 1-2 tablets by mouth See admin instructions. Chew 1-2 tablets by mouth as needed for heartburn or reflux   Yes [provider]  DULoxetine (CYMBALTA) 60 MG capsule Take 2 capsules (120 mg total) by mouth daily. Patient taking differently: Take 120 mg by mouth at bedtime. 11/05/20  Yes Lowne Chase, Yvonne R, DO  ENTYVIO 300 MG injection Inject 300 mg into the vein every 28 (twenty-eight) days.   Yes [provider]  famotidine (PEPCID) 20 MG tablet TAKE 1 TABLET BY MOUTH TWICE A DAY Patient taking differently: Take 20 mg by mouth daily as needed for heartburn or indigestion. 04/01/21  Yes Armbruster, Carlota Raspberry, MD  gabapentin (NEURONTIN) 100 MG capsule TAKE 1 CAPSULE BY MOUTH THREE TIMES A DAY Patient taking differently: Take 100 mg by mouth 3 (three) times daily. 02/13/21  Yes Roma Schanz R, DO  metoprolol succinate (TOPROL-XL) 100 MG 24 hr tablet Take 50-100 mg by mouth See admin instructions. Take 100 mg by mouth in the morning and 50 mg at bedtime   Yes [provider]  nystatin (MYCOSTATIN/NYSTOP) powder Apply 1 application topically daily as needed for rash. 05/06/21  Yes [provider]  oxyCODONE (OXY IR/ROXICODONE) 5 MG immediate release tablet Take 5 mg by mouth at bedtime as needed for moderate pain. 04/27/21  Yes [provider]  Vitamin D, Ergocalciferol,  (DRISDOL) 1.25 MG (50000 UNIT) CAPS capsule Take 50,000 Units by mouth every 30 (thirty) days.   Yes [provider]  folic acid (FOLVITE) 1 MG tablet Take 2 tablets (2 mg total) by mouth daily. Patient taking differently: Take 1-2 mg by mouth daily. 05/26/17   [name], MD  NONFORMULARY OR COMPOUNDED ITEM NerveE for neuropathy Patient not taking: No sig reported 03/10/21   Carollee Herter, Alferd Apa, DO  ondansetron (ZOFRAN) 4 MG tablet Take 1 tablet (4 mg total) by mouth every 6 (six) hours as needed for nausea. Patient not taking: No sig reported 12/10/20   Kerney Elbe, DO    Physical Exam:  Constitutional: Elderly female who appears to be Vitals:   05/14/21 1439 05/14/21 1500 05/14/21 1530 05/14/21 1600  BP:  128/77 (!) 143/72 125/78  Pulse: (!) 118 (!) 127 (!) 128 (!) 127  Resp: 10 (!) 26 20 13   Temp:      TempSrc:      SpO2: 99% 100% 93% 97%   Eyes: PERRL, lids and conjunctivae normal ENMT: Mucous membranes are dry. Posterior pharynx clear of any exudate or lesions.  Neck: normal, supple, no masses, no thyromegaly Respiratory: clear to auscultation bilaterally, no wheezing, no crackles. Normal respiratory effort. No accessory muscle use.  Cardiovascular: Tachycardic, no murmurs / rubs / gallops. No extremity edema. 2+ pedal pulses. No carotid bruits.  Abdomen: No tenderness to palpation appreciated.  Ileostomy present on the lower abdomen.  Abdominal wound present  with drainage present without signs of erythema.  Musculoskeletal: no clubbing / cyanosis. No joint deformity upper and lower extremities. Good ROM, no contractures. Normal muscle tone.  Skin: no rashes, lesions, ulcers. No induration Neurologic: CN 2-12 grossly intact. Sensation intact, DTR normal. Strength 4+/5 in  all 4.  Psychiatric: Normal judgment and insight. Alert and oriented x 3. Normal mood.     Labs on Admission: I have personally reviewed following labs and imaging studies  CBC: Recent  Labs  Lab 05/14/21 0920  WBC 11.9*  NEUTROABS 7.3  HGB 10.3*  HCT 32.0*  MCV 98.5  PLT 701*    Basic Metabolic Panel: Recent Labs  Lab 05/14/21 0920  NA 129*  K 3.9  CL 85*  CO2 24  GLUCOSE 124*  BUN 79*  CREATININE 4.94*  CALCIUM 10.1    GFR: CrCl cannot be calculated (Unknown ideal weight.). Liver Function Tests: Recent Labs  Lab 05/14/21 0920  AST 51*  ALT 71*  ALKPHOS 207*  BILITOT 0.9  PROT 9.1*  ALBUMIN 3.4*    No results for input(s): LIPASE, AMYLASE in the last 168 hours. No results for input(s): AMMONIA in the last 168 hours. Coagulation Profile: No results for input(s): INR, PROTIME in the last 168 hours. Cardiac Enzymes: No results for input(s): CKTOTAL, CKMB, CKMBINDEX, TROPONINI in the last 168 hours. BNP (last 3 results) No results for input(s): PROBNP in the last 8760 hours. HbA1C: No results for input(s): HGBA1C in the last 72 hours. CBG: No results for input(s): GLUCAP in the last 168 hours. Lipid Profile: No results for input(s): CHOL, HDL, LDLCALC, TRIG, CHOLHDL, LDLDIRECT in the last 72 hours. Thyroid Function Tests: No results for input(s): TSH, T4TOTAL, FREET4, T3FREE, THYROIDAB in the last 72 hours. Anemia Panel: No results for input(s): VITAMINB12, FOLATE, FERRITIN, TIBC, IRON, RETICCTPCT in the last 72 hours. Urine analysis:    Component Value Date/Time   COLORURINE YELLOW 12/07/2020 2141   APPEARANCEUR HAZY (A) 12/07/2020 2141   LABSPEC 1.020 12/07/2020 2141   PHURINE 6.0 12/07/2020 2141   GLUCOSEU NEGATIVE 12/07/2020 2141   GLUCOSEU NEGATIVE 03/14/2009 1603   HGBUR MODERATE (A) 12/07/2020 2141   BILIRUBINUR negative 12/18/2020 1151   KETONESUR NEGATIVE 12/07/2020 2141   PROTEINUR Positive (A) 12/18/2020 1151   PROTEINUR 100 (A) 12/07/2020 2141   UROBILINOGEN negative (A) 12/18/2020 1151   UROBILINOGEN 0.2 04/12/2013 0900   NITRITE negative 12/18/2020 1151   NITRITE POSITIVE (A) 12/07/2020 2141   LEUKOCYTESUR  Negative 12/18/2020 1151   LEUKOCYTESUR SMALL (A) 12/07/2020 2141   Sepsis Labs: Recent Results (from the past 240 hour(s))  Resp Panel by RT-PCR (Flu A&B, Covid) Nasopharyngeal Swab     Status: None   Collection Time: 05/14/21 12:41 PM   Specimen: Nasopharyngeal Swab; Nasopharyngeal(NP) swabs in vial transport medium  Result Value Ref Range Status   SARS Coronavirus 2 by RT PCR NEGATIVE NEGATIVE Final    Comment: (NOTE) SARS-CoV-2 target nucleic acids are NOT DETECTED.  The SARS-CoV-2 RNA is generally detectable in upper respiratory specimens during the acute phase of infection. The lowest concentration of SARS-CoV-2 viral copies this assay can detect is 138 copies/mL. A negative result does not preclude SARS-Cov-2 infection and should not be used as the sole basis for treatment or other patient management decisions. A negative result may occur with  improper specimen collection/handling, submission of specimen other than nasopharyngeal swab, presence of viral mutation(s) within the areas targeted by this assay, and inadequate number of viral copies(<138 copies/mL). A negative result must be combined with clinical observations, patient history, and epidemiological information. The expected result is Negative.  Fact Sheet for Patients:  EntrepreneurPulse.com.au  Fact Sheet for Healthcare Providers:  IncredibleEmployment.be  This test is no t yet approved or cleared by the [place]  States FDA and  has been authorized for detection and/or diagnosis of SARS-CoV-2 by FDA under an Emergency Use Authorization (EUA). This EUA will remain  in effect (meaning this test can be used) for the duration of the COVID-19 declaration under Section 564(b)(1) of the Act, 21 U.S.C.section 360bbb-3(b)(1), unless the authorization is terminated  or revoked sooner.       Influenza A by PCR NEGATIVE NEGATIVE Final   Influenza B by PCR NEGATIVE NEGATIVE Final     Comment: (NOTE) The Xpert Xpress SARS-CoV-2/FLU/RSV plus assay is intended as an aid in the diagnosis of influenza from Nasopharyngeal swab specimens and should not be used as a sole basis for treatment. Nasal washings and aspirates are unacceptable for Xpert Xpress SARS-CoV-2/FLU/RSV testing.  Fact Sheet for Patients: EntrepreneurPulse.com.au  Fact Sheet for Healthcare Providers: IncredibleEmployment.be  This test is not yet approved or cleared by the [place] FDA and has been authorized for detection and/or diagnosis of SARS-CoV-2 by FDA under an Emergency Use Authorization (EUA). This EUA will remain in effect (meaning this test can be used) for the duration of the COVID-19 declaration under Section 564(b)(1) of the Act, 21 U.S.C. section 360bbb-3(b)(1), unless the authorization is terminated or revoked.  Performed at [facility], [place] [address] [place], [place] [address]      Radiological Exams on Admission: CT ABDOMEN PELVIS WO CONTRAST  Result Date: 05/14/2021 CLINICAL DATA:  Acute generalized abdominal pain. EXAM: CT ABDOMEN AND PELVIS WITHOUT CONTRAST TECHNIQUE: Multidetector CT imaging of the abdomen and pelvis was performed following the standard protocol without IV contrast. COMPARISON:  May 01, 2021. FINDINGS: Lower chest: No acute abnormality. Hepatobiliary: No focal liver abnormality is seen. Status post cholecystectomy. No biliary dilatation. Pancreas: Unremarkable. No pancreatic ductal dilatation or surrounding inflammatory changes. Spleen: Normal in size without focal abnormality. Adrenals/Urinary Tract: Adrenal glands are unremarkable. Kidneys are normal, without renal calculi, focal lesion, or hydronephrosis. Bladder is unremarkable. Stomach/Bowel: Stomach is unremarkable. Ileostomy is noted in the right lower quadrant. Status post proctocolectomy. There is no evidence of abnormal bowel dilatation. Possible contrast  extravasation or perforation noted along the anterior abdominal wall in midline surgical incision on prior exam can not be evaluated currently due to the lack of intravenous and oral contrast. Vascular/Lymphatic: Aortic atherosclerosis. No enlarged abdominal or pelvic lymph nodes. Reproductive: Status post hysterectomy. No adnexal masses. Other: No ascites or fluid collection is noted. Musculoskeletal: No acute or significant osseous findings. IMPRESSION: Status post proctocolectomy. Ileostomy is noted in right lower quadrant. There is no evidence of bowel obstruction seen at this time. No definite acute abnormality is seen in the abdomen or pelvis at this time. Aortic Atherosclerosis (ICD10-I70.0). Electronically Signed   By: Marijo Conception M.D.   On: 05/14/2021 15:03   DG Chest 2 View  Result Date: 05/14/2021 CLINICAL DATA:  Weakness/nausea for few days,,no energy ,hx colostomy EXAM: CHEST - 2 VIEW COMPARISON:  12/07/2020 FINDINGS: Lungs are clear. Heart size and mediastinal contours are within normal limits. No effusion. Visualized bones unremarkable.  Surgical clips right upper quadrant. IMPRESSION: No acute cardiopulmonary disease. Electronically Signed   By: Lucrezia Europe M.D.   On: 05/14/2021 09:52    EKG: Independently reviewed.  Sinus tachycardia at 139 bpm  Assessment/Plan Acute kidney injury superimposed on chronic kidney disease stage IV secondary to dehydration: Patient presents with creatinine elevated up to 4.94 with BUN 79.  Baseline creatinine previously noted to be around 2.  Suspect secondary to dehydration in  the setting of nausea, vomiting, and output ileostomy.  Patient is followed in the outpatient setting by [facility]. -Admit to a medical telemetry bed -Strict intake and output -Bolus additional 1 L of normal saline IV fluids and placed on a rate of 150 ml/hr -Avoid nephrotoxic agents -Check urinalysis -Continue to monitor kidney function  daily -Formally consult nephrology if patient's kidney function does not appear to improve.  SIRS: Patient was noted to be tachycardic and tachypneic with WBC 11.9.  Lactic acid was initially 2.5->2.1.  Suspect symptoms are secondary to dehydration. -Follow-up blood cultures -Check urinalysis -Trend lactic acid -Recheck CBC in a.m  Nausea and vomiting: Patient reports intermittent nausea and vomiting, but has been able to keep some fluid and liquids down. -Antiemetics as needed  HyponatremiaHypochloremia: Acute. Sodium 129 and chloride 85 on admission.  Suspect symptoms secondary to poor p.o. intake nausea, vomiting, and ileostomy. -Continue normal saline IV fluids as noted above -Continue to monitor sodium levels  Elevated anion gap: Acute.  Patient had initial anion gap elevated and 20 CO2 24.  Suspect possibly secondary to lactic acidosis. -Recheck BMP in a.m.  Crohn's disease/ulcerative colitis: Patient s/p colectomy, ileal prostatectomy, and J-pouch revision in 1998 with proctitis.  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  Recently admitted for wound dehiscence with fistula formation 8/26, but was managed nonoperatively. Patient reports wound  is healing.  CT scan of the abdomen and pelvis without contrast did not show any acute findings.  She reports missing her dose Entyvio injection on 18th of last month. -Ostomy supplies as needed -Wound care consult -Continue outpatient follow-up with surgery at [facility]  Thrombocytosis: Acute. Suspect likely reactive in nature. -Continue to monitor  Anemia chronic disease: Hemoglobin 10.3 g/dL which appears near patient's baseline. -Continue to monitor  Essential hypertension: Blood pressure currently maintained. -Continue metoprolol  Generalized weakness - PT/OT consult  History of C. Difficile -Monitor intake and output and check C. difficile panel if warranted  GERD -Scheduled Pepcid DVT  prophylaxis: lovenox  Code Status: Full  Family Communication:  None Disposition Plan: home Consults called: None Admission status: Inpatient, likely require more than 2 midnight stay due to acute kidney injury and need of aggressive IV fluid hydration  Norval Morton MD [facility]   If 7PM-7AM, please contact night-coverage   05/14/2021, 6:27 PM

## 2021-05-14 NOTE — Consult Note (Signed)
[place] Nurse Consult Note: Reason for Consult: Dressing orders Wound type: Surgical wound dehiscence Pressure Injury POA: N/A Measurement:To be obtained by Nursing and documented on the Nursing Flow Sheet Wound bed:red, moist Drainage (amount, consistency, odor) Light yellow exudate from superior aspect of wound Periwound:intact with evidence of recent wound healing Dressing procedure/placement/frequency: Patient was seen in the outpatient clinic by her surgeon, Dr. Drue Flirt at [facility] on 05/12/21 and he made changes in the POC that will be reflected in my orders today specifically, to dress the mild dehiscence to the midline incision, superior aspect with a piece of silver hydrofiber (Aquacel Ag+Advantage, [employer] # [id number]) topped with a piece of dry gauze and secured with tape.  The ostomy is to be pouch es with a 1-piece flexible pouch and while the previous provider uses ostomy paste, our facilities use a skin barrier ring. See below for ostomy care orders. Dr. Geronimo Boot note from Tuesday stated that patient and daughter were instructed on wound care.  Note:  Wound care orders from [facility] state to change dressing every other day.  We will perform daily while in house.  [place] Nurse ostomy consult note Stoma type/location: RUQ loop ileostomy Stomal assessment/size: Not measured today (pouch intact), mucosa is red, moist, stoma is elevated and functioning. Peristomal assessment: Not seen today (pouch intact) Treatment options for stomal/peristomal skin: past used previously, we will use skin barrier ring Output:L brown effluent  Ostomy pouching: 1pc. Flexible pouching system with skin barrier ring. Her equivalent is [place], [employer] supply number is Kellie Simmering # [id number], with skin barrier ring, Lawson # [id number].  Education provided: None Enrolled patient in [place] program: No. Patient had a previous ostomy; she is well established with a supplier.      [place]  nursing team will not follow, but will remain available to this patient, the nursing and medical teams.  Please re-consult if needed. Thanks, Maudie Flakes, MSN, RN, [place], Arther Abbott  Pager# [phone]

## 2021-05-15 DIAGNOSIS — N179 Acute kidney failure, unspecified: Secondary | ICD-10-CM | POA: Diagnosis not present

## 2021-05-15 DIAGNOSIS — N189 Chronic kidney disease, unspecified: Secondary | ICD-10-CM | POA: Diagnosis not present

## 2021-05-15 LAB — URINALYSIS, MICROSCOPIC (REFLEX)

## 2021-05-15 LAB — COMPREHENSIVE METABOLIC PANEL
ALT: 53 U/L — ABNORMAL HIGH (ref 0–44)
AST: 44 U/L — ABNORMAL HIGH (ref 15–41)
Albumin: 2.5 g/dL — ABNORMAL LOW (ref 3.5–5.0)
Alkaline Phosphatase: 144 U/L — ABNORMAL HIGH (ref 38–126)
Anion gap: 9 (ref 5–15)
BUN: 62 mg/dL — ABNORMAL HIGH (ref 8–23)
CO2: 24 mmol/L (ref 22–32)
Calcium: 8.5 mg/dL — ABNORMAL LOW (ref 8.9–10.3)
Chloride: 103 mmol/L (ref 98–111)
Creatinine, Ser: 3.67 mg/dL — ABNORMAL HIGH (ref 0.44–1.00)
GFR, Estimated: 13 mL/min — ABNORMAL LOW (ref >60)
Glucose, Bld: 102 mg/dL — ABNORMAL HIGH (ref 70–99)
Potassium: 4.4 mmol/L (ref 3.5–5.1)
Sodium: 136 mmol/L (ref 135–145)
Total Bilirubin: 0.7 mg/dL (ref 0.3–1.2)
Total Protein: 6.4 g/dL — ABNORMAL LOW (ref 6.5–8.1)

## 2021-05-15 LAB — CBC
HCT: 24.5 % — ABNORMAL LOW (ref 36.0–46.0)
Hemoglobin: 7.9 g/dL — ABNORMAL LOW (ref 12.0–15.0)
MCH: 32.6 pg (ref 26.0–34.0)
MCHC: 32.2 g/dL (ref 30.0–36.0)
MCV: 101.2 fL — ABNORMAL HIGH (ref 80.0–100.0)
Platelets: 489 10*3/uL — ABNORMAL HIGH (ref 150–400)
RBC: 2.42 MIL/uL — ABNORMAL LOW (ref 3.87–5.11)
RDW: 14.3 % (ref 11.5–15.5)
WBC: 8.7 10*3/uL (ref 4.0–10.5)
nRBC: 0 % (ref 0.0–0.2)

## 2021-05-15 LAB — FERRITIN: Ferritin: 220 ng/mL (ref 11–307)

## 2021-05-15 LAB — IRON AND TIBC
Iron: 36 ug/dL (ref 28–170)
Saturation Ratios: 10 % — ABNORMAL LOW (ref 10.4–31.8)
TIBC: 350 ug/dL (ref 250–450)
UIBC: 314 ug/dL

## 2021-05-15 LAB — URINALYSIS, ROUTINE W REFLEX MICROSCOPIC
Bilirubin Urine: NEGATIVE
Glucose, UA: NEGATIVE mg/dL
Ketones, ur: NEGATIVE mg/dL
Leukocytes,Ua: NEGATIVE
Nitrite: NEGATIVE
Protein, ur: NEGATIVE mg/dL
Specific Gravity, Urine: 1.01 (ref 1.005–1.030)
pH: 6 (ref 5.0–8.0)

## 2021-05-15 LAB — RETICULOCYTES
Immature Retic Fract: 23.6 % — ABNORMAL HIGH (ref 2.3–15.9)
RBC.: 2.46 MIL/uL — ABNORMAL LOW (ref 3.87–5.11)
Retic Count, Absolute: 33.5 10*3/uL (ref 19.0–186.0)
Retic Ct Pct: 1.4 % (ref 0.4–3.1)

## 2021-05-15 LAB — VITAMIN B12: Vitamin B-12: 782 pg/mL (ref 180–914)

## 2021-05-15 LAB — LACTIC ACID, PLASMA: Lactic Acid, Venous: 1.3 mmol/L (ref 0.5–1.9)

## 2021-05-15 LAB — FOLATE: Folate: 26.5 ng/mL (ref >5.9)

## 2021-05-15 LAB — MAGNESIUM: Magnesium: 1.4 mg/dL — ABNORMAL LOW (ref 1.7–2.4)

## 2021-05-15 MED ORDER — GABAPENTIN 100 MG PO CAPS
100.0000 mg | ORAL_CAPSULE | Freq: Three times a day (TID) | ORAL | Status: DC
  Administered 2021-05-15 – 2021-05-16 (×4): 100 mg via ORAL
  Filled 2021-05-15 (×4): qty 1

## 2021-05-15 MED ORDER — ALLOPURINOL 100 MG PO TABS
100.0000 mg | ORAL_TABLET | Freq: Every day | ORAL | Status: DC
  Administered 2021-05-15: 100 mg via ORAL
  Filled 2021-05-15: qty 1

## 2021-05-15 MED ORDER — MAGNESIUM SULFATE 4 GM/100ML IV SOLN
4.0000 g | Freq: Once | INTRAVENOUS | Status: AC
  Administered 2021-05-15: 4 g via INTRAVENOUS
  Filled 2021-05-15: qty 100

## 2021-05-15 MED ORDER — ALLOPURINOL 300 MG PO TABS: 300.0000 mg | ORAL_TABLET | Freq: Every day | ORAL | Status: DC

## 2021-05-15 MED ORDER — CALCIUM CARBONATE ANTACID 500 MG PO CHEW
750.0000 mg | CHEWABLE_TABLET | Freq: Three times a day (TID) | ORAL | Status: DC | PRN
  Administered 2021-05-15: 750 mg via ORAL
  Filled 2021-05-15: qty 4

## 2021-05-15 NOTE — TOC Initial Note (Signed)
Transition of Care [facility]) - Initial/Assessment Note    Patient Details  Name: [name] MRN: [id number] Date of Birth: 1955-12-27  Transition of Care [facility]) CM/SW Contact:    Tom-Johnson, Renea Ee, RN Phone Number: 05/15/2021, 1:36 PM  Clinical Narrative:                 CM spoke with patient at bedside about needs for post hospital transition. Patient states she lives with her "Companion" who stays with her for safety and security. Has a daughter and grand daughter who assists with care. Mother is 14 yrs old and supportive. Daughter transports to and from appointments and will transport at discharge. States she is currently active with [facility] with RN services for wound care. Would like to use their services at discharge. Has a walker and cane at home. Denies any TOC needs at this time. Will continue to follow with needs.    Expected Discharge Plan: [place] Barriers to Discharge: Continued Medical Work up   Patient Goals and CMS Choice Patient states their goals for this hospitalization and ongoing recovery are:: To go home CMS Medicare.gov Compare Post Acute Care list provided to:: Patient Choice offered to / list presented to : Patient  Expected Discharge Plan and Services Expected Discharge Plan: [place]   Discharge Planning Services: CM Consult Post Acute Care Choice: [place] arrangements for the past 2 months: [place]: [facility] (Currently active with RN services for wound care) Date [facility] Agency Contacted: 05/15/21 Time HH Agency Contacted: 1335 Representative spoke with at [place]: Drue Dun  Prior Living Arrangements/Services Living arrangements for the past 2 months: [place] with:: Significant Other (" Companion".) Patient language and need for interpreter reviewed:: Yes Do you feel safe going back to the place where you  live?: Yes      Need for Family Participation in Patient Care: Yes (Comment) Care giver support system in place?: Yes (comment) Current home services: DME, Home RN (RN for wound care, has a cane and a walker.) Criminal Activity/Legal Involvement Pertinent to Current Situation/Hospitalization: No - Comment as needed  Activities of Daily Living      Permission Sought/Granted Permission sought to share information with : Case Manager, [place] granted to share information with : Yes, Verbal Permission Granted              Emotional Assessment Appearance:: Appears stated age Attitude/Demeanor/Rapport: Engaged Affect (typically observed): Accepting, Appropriate, Hopeful, Calm Orientation: : Oriented to Self, Oriented to Place, Oriented to  Time, Oriented to Situation Alcohol / Substance Use: Not Applicable Psych Involvement: No (comment)  Admission diagnosis:  Dehydration [E86.0] Weakness [R53.1] AKI (acute kidney injury) ([place]) [N17.9] Acute kidney injury superimposed on chronic kidney disease ([place]) [N17.9, N18.9] Patient Active Problem List   Diagnosis Date Noted   Acute kidney injury superimposed on chronic kidney disease ([place]) 05/14/2021   Thrombocytosis 05/14/2021   SIRS (systemic inflammatory response syndrome) ([place]) 05/14/2021   Hypochloremia 05/14/2021   Chronic pain of left knee 03/10/2021   Depression, major, single episode, severe ([place]) 12/18/2020   Hyperlipidemia associated with type 2 diabetes mellitus ([place]) 12/18/2020   Severe sepsis ([place]) 12/07/2020   Acute pyelonephritis 12/07/2020   Closed fracture of  left tibial plateau 10/30/2020   Screening for osteoporosis 10/30/2020   Diarrhea with dehydration 09/21/2020   Crohn's disease of jejunum ([place]) 08/23/2020   Chronic diarrhea 07/23/3566   Metabolic acidosis 01/41/0301   ARF (acute renal failure) ([place]) 08/23/2020   Acute renal failure superimposed on stage 4 chronic kidney  disease ([place]) 08/22/2020   Hyperglycemia 02/26/2020   Immunologic deficiency syndrome ([place]) 02/26/2020   Prothrombin gene mutation ([place]) 02/26/2020   Acute pain of left shoulder 06/04/2019   Pes anserine bursitis 04/12/2018   Pain of right thumb 04/12/2018   Low back pain 06/21/2017   Acute right-sided low back pain with right-sided sciatica 06/21/2017   Gout 06/21/2017   Superficial thrombophlebitis of right upper extremity 31/43/8887   Acute basilic vein thrombosis, right 02/16/2017   Symptomatic anemia 01/15/2017   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   Rectal pain 12/01/2015   IBD (inflammatory bowel disease) 10/24/2015   Anal stricture 10/24/2015   Gout of foot 10/29/2014   Ovarian cyst, right 06/12/2014   Acute on chronic renal failure ([place]) 04/02/2013   Hydrosalpinx 02/14/2013   Hyperparathyroidism ([place]) 09/18/2012   HSV (herpes simplex virus) anogenital infection 06/28/2012   CKD (chronic kidney disease), stage IV ([name]) 06/17/2012   LLQ abdominal pain 06/22/2011   RESTLESS LEGS SYNDROME 08/05/2009   PULMONARY NODULE, RIGHT UPPER LOBE 02/28/2009   TOBACCO ABUSE, HX OF 02/11/2009   PAIN IN JOINT, MULTIPLE SITES 02/04/2009   Nonspecific (abnormal) findings on radiological and other examination of body structure 12/23/2008   CT, CHEST, ABNORMAL 12/23/2008   HEMANGIOMA SIMPLEX 12/16/2008   Pain in joint, pelvic region and thigh 12/16/2008   SKIN RASH 12/16/2008   TINEA CORPORIS 04/18/2008   LATERAL EPICONDYLITIS, RIGHT 04/18/2008   Morbid obesity ([place]) 02/05/2008   ANEMIA 02/05/2008   ANAL STENOSIS 02/05/2008   Stool culture positive for Clostridioides difficile 02/05/2008   Personal history of other endocrine, metabolic, and immunity disorders 02/05/2008   Personal history of other diseases of digestive system 02/05/2008   SYNCOPE AND COLLAPSE 02/02/2008   Hyponatremia 12/15/2007   Dehydration 12/15/2007   HYPOKALEMIA 12/15/2007   ANEMIA-IRON DEFICIENCY 12/15/2007    LEUKOCYTOSIS 12/15/2007   Anxiety state 12/15/2007   Disorder resulting from impaired renal function 11/16/2007   TINNITUS NOS 05/25/2007   DE QUERVAIN'S TENOSYNOVITIS 05/25/2007   Hyperlipidemia LDL goal <100 10/23/2006   Depression 10/23/2006   Essential hypertension 10/23/2006   GERD 10/23/2006   Gastro-esophageal reflux disease without esophagitis 10/23/2006   Ulcerative colitis without complications ([place]) 57/97/2820   GASTRIC POLYP 11/16/2001   GERD with esophagitis 11/16/2001   PCP:  Ann Held, DO Pharmacy:   [id number] [place] [address]Phone: [phone]: [phone] [id number] [place], [id number]: [phone]: [phone]    Social Determinants of Health (SDOH) Interventions    Readmission Risk Interventions Readmission Risk Prevention Plan 12/10/2020  Transportation Screening Complete  PCP or Specialist Appt within 3-5 Days Complete  HRI or [place]  Social Work Consult for RTompkinsPlanning/Counseling Complete  Palliative Care Screening Not Applicable  Medication Review (Press photographer Complete  Some recent data might be hidden

## 2021-05-15 NOTE — Progress Notes (Signed)
PROGRESS NOTE    [name]  PXT:[id number] DOB: 07-Dec-1955 DOA: 05/14/2021 PCP: Ann Held, DO   Chief Complaint  Patient presents with   Weakness   Brief Narrative: 65 year old female with history of HTN, HLD, Crohn's disease but patient reports she has ulcerative colitis, status post ileal pouch-antral anastomosis in 1998 who underwent EUA, pouchoscopy, diverting loop ileostomy due to pouchitis on 8/18 by Dr. Forest Gleason [facility], was readmitted 8/20 6-04/1929 first due to wound dehiscence with concern for fistula presented to the ED with weakness, managed nonoperatively.  She was discharged and since leaving the hospital she has not been able to keep up with fluid intake, however she tries to get up and stand she gets lightheaded and feels as though she could pass out, had generalized malaise, decreased urine output, intermittent nausea, vomiting and having some drainage from her abdominal wound.  She was supposed to get her Entyvio injection on 18th of last month, but has not had it yet due to being in the hospital.   ED Course: she was afebrile with heart rates elevated up to 136 with respirations 10-26, and all other vital signs maintained.  Labs significant for WBC 11.9, hemoglobin 10.3 platelets 701, sodium 129, chloride 85, BUN 79, creatinine 4.94, anion gap 20, alkaline phosphatase 207, AST 51, ALT 71, total protein 9.1, lactic acid 2.5->2.1.  Influenza and COVID-19 screening was negative.  Blood cultures have been obtained and urinalysis was pending.  Patient was given acetaminophen, 2 L of IV fluids, and started on a rate at 125 mL/h and admitted  Subjective:  Seen/examined this am Patient reports no new complaints. Reports having liquid ileostomy output and this is normal for her.  Denies dysuria flank pain or any urinary symptoms although UA appears abnormal but reports it was not drawn from [place]:  Acute kidney injury on CKD stage IV suspect  due to dehydration, decreased oral intake with nausea vomiting, and also ileostomy output.  Baseline creatinine ~ 2.1-2.5,  followed by Dr. Marval Regal from [facility].  Patient bolused 1 L and currently on 150 mill per hour, monitor urine output, intake output, renal function closely.  If does not improve will consult nephrology group.  Renal function improving. Recent Labs    10/08/20 1405 10/23/20 1152 12/07/20 2056 12/08/20 0305 12/09/20 0408 12/10/20 1105 12/18/20 1147 05/01/21 1514 05/14/21 0920 05/15/21 0547  BUN 34* 32* 35* 35* 29* 25* 35* 38* 79* 62*  CREATININE 1.99* 2.14* 2.66* 2.58* 2.33* 2.13* 2.24* 3.54* 4.94* 3.67*   Hyponatremia: Due to poor intake and ileostomy output.  Continue normal saline.  Improved. Recent Labs  Lab 05/14/21 0920 05/15/21 0547  NA 129* 136   SIRS-with tachycardia tachypnea mild leukocytosis lactic acidosis likely from dehydration.  Afebrile, leukocytosis resolved.  UA with WBC more than 50-?  UTI -caution with antibiotics given her history of previous C. difficile -UA re-ordered as previous sample from.    History of C. difficile infection currently negative.  Anemia likely from chronic disease/CKD.  Noted drop to about 7.9 g,- ?hemodilution, baseline hemoglobin 9-10 gm in April. Check anemia panel, fobt. Recent Labs  Lab 05/14/21 0920 05/15/21 0547  HGB 10.3* 7.9*  HCT 32.0* 24.5*    Lactic acidosis due to dehydration/anion gap metabolic acidosis Dehydration:Continue IV fluids. Repeat lactic acid.  Nausea/vomiting intermittent episodes:continue Zofran, Pepcid, IV fluids diet as tolerated  Crohn's disease/ulcerative colitis: Patient s/p colectomy, ileal prostatectomy, and J-pouch revision in 1998 with  proctitis.  Patient underwent diverting ileostomy on 8/18 due to chronic pouchitis at [facility].  Recently admitted for wound dehiscence with fistula formation 8/26, but was managed nonoperatively. Patient reports wound  is healing.  CT scan of the  abdomen and pelvis without contrast did not show any acute findings.  She reports missing her dose Entyvio injection on 18th of last month. -Ostomy supplies as needed.  Continue wound care consult, supportive care, will need follow-up with surgery at [facility] as outpatient  Essential hypertension controlled on her metoprolol  Generalized weakness PT OT consulted  Mild transaminitis monitor  Hypomagnesemia: Replete with IV mag  Thrombocytosis-likely hemoconcentration.  Monitor, improving on ivf Recent Labs  Lab 05/14/21 0920 05/15/21 0547  PLT 701* 489*      Diet Order             Diet Heart Room service appropriate? Yes; Fluid consistency: Thin  Diet effective now                   Patient's There is no height or weight on file to calculate BMI.  DVT prophylaxis: enoxaparin (LOVENOX) injection 30 mg Start: 05/14/21 2100 Code Status:   Code Status: Full Code  Family Communication: plan of care discussed with patient at bedside. Status is: Inpatient Remains inpatient appropriate because:IV treatments appropriate due to intensity of illness or inability to take PO and Inpatient level of care appropriate due to severity of illness Dispo: The patient is from: Home              Anticipated d/c is to: Home              Patient currently is not medically stable to d/c.   Difficult to place patient No  Unresulted Labs (From admission, onward)     Start     Ordered   05/16/21 0500  CBC  Daily,   R     Question:  Specimen collection method  Answer:  Lab=Lab collect   05/15/21 0903   05/16/21 2458  Basic metabolic panel  Daily,   R     Question:  Specimen collection method  Answer:  Lab=Lab collect   05/15/21 0903   05/15/21 0904  Iron and TIBC  (Anemia Panel (PNL))  Add-on,   AD        05/15/21 0903   05/15/21 0904  Ferritin  (Anemia Panel (PNL))  Add-on,   AD        05/15/21 0903   05/15/21 0904  Reticulocytes  (Anemia Panel (PNL))  Add-on,   AD        05/15/21 0903    05/15/21 0903  Vitamin B12  (Anemia Panel (PNL))  Add-on,   AD        05/15/21 0903   05/15/21 0903  Folate  (Anemia Panel (PNL))  Add-on,   AD        05/15/21 0903   05/14/21 0911  Blood culture (routine x 2)  BLOOD CULTURE X 2,   STAT      05/14/21 0911           Medications reviewed:  Scheduled Meds:  DULoxetine  120 mg Oral QHS   enoxaparin (LOVENOX) injection  30 mg Subcutaneous Q24H   famotidine  20 mg Oral Daily   folic acid  1 mg Oral Daily   metoprolol succinate  50 mg Oral QHS   metoprolol tartrate  100 mg Oral q AM   Continuous Infusions:  sodium chloride 150 mL/hr at 05/15/21 4010   Consultants:see note  Procedures:see note Antimicrobials: Anti-infectives (From admission, onward)    None      Culture/Microbiology    Component Value Date/Time   SDES  05/01/2021 1900    WOUND ILEOSTOMY SITE Performed at [facility] [address] [place], [place] [address]    SDES  05/01/2021 1900    WOUND ILEOSTOMY SITE Performed at [facility], [place] [address] [place], [place] [address]    SPECREQUEST  05/01/2021 1900    AEROBIC CULTURE Performed at [facility], [place] [address], [place], [place] [address]    SPECREQUEST  05/01/2021 1900    ANAEROBIC CULTURE Performed at [facility], [place] [address] [place], [place] [address]    CULT  05/01/2021 1900    ABUNDANT ENTEROBACTER CLOACAE FEW STREPTOCOCCUS ANGINOSIS    CULT  05/01/2021 1900    NO ANAEROBES ISOLATED Performed at [facility], [place] [address], [place], [place] [address]    REPTSTATUS 05/05/2021 FINAL 05/01/2021 1900   REPTSTATUS 05/07/2021 FINAL 05/01/2021 1900    Other culture-see note  Objective: Vitals: Today's Vitals   05/14/21 2302 05/15/21 0337 05/15/21 0630 05/15/21 0813  BP: (!) 142/82 (!) 147/73 (!) 141/73 130/77  Pulse: (!) 112 (!) 113 (!) 105 94  Resp: 18 18  18   Temp: 98 F (36.7 C) 98.1 F  (36.7 C) 98.2 F (36.8 C) 98.3 F (36.8 C)  TempSrc: Oral Oral Oral Oral  SpO2: 98% 100% 96% 95%  PainSc:        Intake/Output Summary (Last 24 hours) at 05/15/2021 3329 Last data filed at 05/15/2021 0600 Gross per 24 hour  Intake 3157.31 ml  Output 1800 ml  Net 1357.31 ml   There were no vitals filed for this visit. Weight change:   Intake/Output from previous day: 09/08 0701 - 09/09 0700 In: 3157.3 [P.O.:240; I.V.:1917.3; IV Piggyback:1000] Out: 1800 [Urine:800; Stool:1000] Intake/Output this shift: No intake/output data recorded. There were no vitals filed for this visit. Examination: General exam: AAO x3, not in distress and pleasant. HEENT:Oral mucosa moist, Ear/Nose WNL grossly,dentition normal. Respiratory system: bilaterally diminished, no use of accessory muscle, non tender. Cardiovascular system: S1 & S2 +,No JVD. Gastrointestinal system: Abdomen soft, right-sided ileostomy in place with liquid output, nontender NT,ND, BS+. Nervous System:Alert, awake, moving extremities Extremities: no edema, distal peripheral pulses palpable.  Skin: No rashes,no icterus. MSK: Normal muscle bulk,tone, power Data Reviewed: I have personally reviewed following labs and imaging studies CBC: Recent Labs  Lab 05/14/21 0920 05/15/21 0547  WBC 11.9* 8.7  NEUTROABS 7.3  --   HGB 10.3* 7.9*  HCT 32.0* 24.5*  MCV 98.5 101.2*  PLT 701* 518*   Basic Metabolic Panel: Recent Labs  Lab 05/14/21 0920 05/15/21 0547  NA 129* 136  K 3.9 4.4  CL 85* 103  CO2 24 24  GLUCOSE 124* 102*  BUN 79* 62*  CREATININE 4.94* 3.67*  CALCIUM 10.1 8.5*  MG  --  1.4*   GFR: CrCl cannot be calculated (Unknown ideal weight.). Liver Function Tests: Recent Labs  Lab 05/14/21 0920 05/15/21 0547  AST 51* 44*  ALT 71* 53*  ALKPHOS 207* 144*  BILITOT 0.9 0.7  PROT 9.1* 6.4*  ALBUMIN 3.4* 2.5*   No results for input(s): LIPASE, AMYLASE in the last 168 hours. No results for input(s): AMMONIA in  the last 168 hours. Coagulation Profile: No results for input(s): INR,  PROTIME in the last 168 hours. Cardiac Enzymes: No results for input(s): CKTOTAL, CKMB, CKMBINDEX, TROPONINI in the last 168 hours. BNP (last 3 results) No results for input(s): PROBNP in the last 8760 hours. HbA1C: No results for input(s): HGBA1C in the last 72 hours. CBG: No results for input(s): GLUCAP in the last 168 hours. Lipid Profile: No results for input(s): CHOL, HDL, LDLCALC, TRIG, CHOLHDL, LDLDIRECT in the last 72 hours. Thyroid Function Tests: No results for input(s): TSH, T4TOTAL, FREET4, T3FREE, THYROIDAB in the last 72 hours. Anemia Panel: No results for input(s): VITAMINB12, FOLATE, FERRITIN, TIBC, IRON, RETICCTPCT in the last 72 hours. Sepsis Labs: Recent Labs  Lab 05/14/21 0920 05/14/21 1330  LATICACIDVEN 2.5* 2.1*    Recent Results (from the past 240 hour(s))  Resp Panel by RT-PCR (Flu A&B, Covid) Nasopharyngeal Swab     Status: None   Collection Time: 05/14/21 12:41 PM   Specimen: Nasopharyngeal Swab; Nasopharyngeal(NP) swabs in vial transport medium  Result Value Ref Range Status   SARS Coronavirus 2 by RT PCR NEGATIVE NEGATIVE Final    Comment: (NOTE) SARS-CoV-2 target nucleic acids are NOT DETECTED.  The SARS-CoV-2 RNA is generally detectable in upper respiratory specimens during the acute phase of infection. The lowest concentration of SARS-CoV-2 viral copies this assay can detect is 138 copies/mL. A negative result does not preclude SARS-Cov-2 infection and should not be used as the sole basis for treatment or other patient management decisions. A negative result may occur with  improper specimen collection/handling, submission of specimen other than nasopharyngeal swab, presence of viral mutation(s) within the areas targeted by this assay, and inadequate number of viral copies(<138 copies/mL). A negative result must be combined with clinical observations, patient history, and  epidemiological information. The expected result is Negative.  Fact Sheet for Patients:  EntrepreneurPulse.com.au  Fact Sheet for Healthcare Providers:  IncredibleEmployment.be  This test is no t yet approved or cleared by the [place] FDA and  has been authorized for detection and/or diagnosis of SARS-CoV-2 by FDA under an Emergency Use Authorization (EUA). This EUA will remain  in effect (meaning this test can be used) for the duration of the COVID-19 declaration under Section 564(b)(1) of the Act, 21 U.S.C.section 360bbb-3(b)(1), unless the authorization is terminated  or revoked sooner.       Influenza A by PCR NEGATIVE NEGATIVE Final   Influenza B by PCR NEGATIVE NEGATIVE Final    Comment: (NOTE) The Xpert Xpress SARS-CoV-2/FLU/RSV plus assay is intended as an aid in the diagnosis of influenza from Nasopharyngeal swab specimens and should not be used as a sole basis for treatment. Nasal washings and aspirates are unacceptable for Xpert Xpress SARS-CoV-2/FLU/RSV testing.  Fact Sheet for Patients: EntrepreneurPulse.com.au  Fact Sheet for Healthcare Providers: IncredibleEmployment.be  This test is not yet approved or cleared by the [place] FDA and has been authorized for detection and/or diagnosis of SARS-CoV-2 by FDA under an Emergency Use Authorization (EUA). This EUA will remain in effect (meaning this test can be used) for the duration of the COVID-19 declaration under Section 564(b)(1) of the Act, 21 U.S.C. section 360bbb-3(b)(1), unless the authorization is terminated or revoked.  Performed at [facility], [place] [address], [place], [place] [address]   C Difficile Quick Screen w PCR reflex     Status: None   Collection Time: 05/14/21  8:45 PM   Specimen: STOOL  Result Value Ref Range Status   C Diff antigen NEGATIVE NEGATIVE Final   C  Diff toxin NEGATIVE NEGATIVE Final   C  Diff interpretation No C. difficile detected.  Final    Comment: Performed at [facility], [place] [address] [place], [place] [address]     Radiology Studies: CT ABDOMEN PELVIS WO CONTRAST  Result Date: 05/14/2021 CLINICAL DATA:  Acute generalized abdominal pain. EXAM: CT ABDOMEN AND PELVIS WITHOUT CONTRAST TECHNIQUE: Multidetector CT imaging of the abdomen and pelvis was performed following the standard protocol without IV contrast. COMPARISON:  May 01, 2021. FINDINGS: Lower chest: No acute abnormality. Hepatobiliary: No focal liver abnormality is seen. Status post cholecystectomy. No biliary dilatation. Pancreas: Unremarkable. No pancreatic ductal dilatation or surrounding inflammatory changes. Spleen: Normal in size without focal abnormality. Adrenals/Urinary Tract: Adrenal glands are unremarkable. Kidneys are normal, without renal calculi, focal lesion, or hydronephrosis. Bladder is unremarkable. Stomach/Bowel: Stomach is unremarkable. Ileostomy is noted in the right lower quadrant. Status post proctocolectomy. There is no evidence of abnormal bowel dilatation. Possible contrast extravasation or perforation noted along the anterior abdominal wall in midline surgical incision on prior exam can not be evaluated currently due to the lack of intravenous and oral contrast. Vascular/Lymphatic: Aortic atherosclerosis. No enlarged abdominal or pelvic lymph nodes. Reproductive: Status post hysterectomy. No adnexal masses. Other: No ascites or fluid collection is noted. Musculoskeletal: No acute or significant osseous findings. IMPRESSION: Status post proctocolectomy. Ileostomy is noted in right lower quadrant. There is no evidence of bowel obstruction seen at this time. No definite acute abnormality is seen in the abdomen or pelvis at this time. Aortic Atherosclerosis (ICD10-I70.0). Electronically Signed   By: Marijo Conception M.D.   On: 05/14/2021 15:03   DG Chest 2 View  Result Date: 05/14/2021 CLINICAL  DATA:  Weakness/nausea for few days,,no energy ,hx colostomy EXAM: CHEST - 2 VIEW COMPARISON:  12/07/2020 FINDINGS: Lungs are clear. Heart size and mediastinal contours are within normal limits. No effusion. Visualized bones unremarkable.  Surgical clips right upper quadrant. IMPRESSION: No acute cardiopulmonary disease. Electronically Signed   By: Lucrezia Europe M.D.   On: 05/14/2021 09:52     LOS: 1 day   Antonieta Pert, MD [facility]  05/15/2021, 9:07 AM

## 2021-05-15 NOTE — Plan of Care (Signed)
  Problem: Education: Goal: Knowledge of General Education information will improve Description: Including pain rating scale, medication(s)/side effects and non-pharmacologic comfort measures Outcome: Progressing   Problem: Clinical Measurements: Goal: Ability to maintain clinical measurements within normal limits will improve Outcome: Progressing   

## 2021-05-16 DIAGNOSIS — N179 Acute kidney failure, unspecified: Secondary | ICD-10-CM | POA: Diagnosis not present

## 2021-05-16 DIAGNOSIS — N189 Chronic kidney disease, unspecified: Secondary | ICD-10-CM | POA: Diagnosis not present

## 2021-05-16 LAB — BASIC METABOLIC PANEL
Anion gap: 10 (ref 5–15)
Anion gap: 8 (ref 5–15)
BUN: 43 mg/dL — ABNORMAL HIGH (ref 8–23)
BUN: 34 mg/dL — ABNORMAL HIGH (ref 8–23)
CO2: 22 mmol/L (ref 22–32)
CO2: 22 mmol/L (ref 22–32)
Calcium: 8.5 mg/dL — ABNORMAL LOW (ref 8.9–10.3)
Calcium: 8.1 mg/dL — ABNORMAL LOW (ref 8.9–10.3)
Chloride: 106 mmol/L (ref 98–111)
Chloride: 107 mmol/L (ref 98–111)
Creatinine, Ser: 2.67 mg/dL — ABNORMAL HIGH (ref 0.44–1.00)
Creatinine, Ser: 2.46 mg/dL — ABNORMAL HIGH (ref 0.44–1.00)
GFR, Estimated: 19 mL/min — ABNORMAL LOW (ref >60)
GFR, Estimated: 21 mL/min — ABNORMAL LOW (ref >60)
Glucose, Bld: 99 mg/dL (ref 70–99)
Glucose, Bld: 114 mg/dL — ABNORMAL HIGH (ref 70–99)
Potassium: 3.5 mmol/L (ref 3.5–5.1)
Potassium: 3.9 mmol/L (ref 3.5–5.1)
Sodium: 138 mmol/L (ref 135–145)
Sodium: 137 mmol/L (ref 135–145)

## 2021-05-16 LAB — CBC
HCT: 25.3 % — ABNORMAL LOW (ref 36.0–46.0)
Hemoglobin: 8 g/dL — ABNORMAL LOW (ref 12.0–15.0)
MCH: 32.5 pg (ref 26.0–34.0)
MCHC: 31.6 g/dL (ref 30.0–36.0)
MCV: 102.8 fL — ABNORMAL HIGH (ref 80.0–100.0)
Platelets: 470 10*3/uL — ABNORMAL HIGH (ref 150–400)
RBC: 2.46 MIL/uL — ABNORMAL LOW (ref 3.87–5.11)
RDW: 14.5 % (ref 11.5–15.5)
WBC: 6.6 10*3/uL (ref 4.0–10.5)
nRBC: 0 % (ref 0.0–0.2)

## 2021-05-16 MED ORDER — ONDANSETRON HCL 4 MG PO TABS: 4.0000 mg | ORAL_TABLET | Freq: Four times a day (QID) | ORAL | 0 refills | Status: DC | PRN

## 2021-05-16 NOTE — Discharge Summary (Signed)
Physician Discharge Summary  [name] ZOX:[id number] DOB: [birth year] DOA: 05/14/2021  PCP: Ann Held, DO  Admit date: 05/14/2021 Discharge date: 05/16/2021  Admitted From: home Disposition:  home  Recommendations for Outpatient Follow-up:  Follow up with PCP in 1-2 weeks Please obtain BMP/CBC in one week  Home Health: yes Equipment/Devices: none  Discharge Condition: Stable Code Status:   Code Status: Full Code Diet recommendation:  Diet Order             Diet Heart Room service appropriate? 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 Forest Gleason had [facility], was readmitted 8/20 [birth year]1929 first due to wound dehiscence with concern for fistula presented to the ED with weakness, managed nonoperatively.  She was discharged and since leaving the hospital she has not been able to keep up with fluid intake, however she tries to get up and stand she gets lightheaded and feels as though she could pass out, had generalized malaise, decreased urine output, intermittent nausea, vomiting and having some drainage from her abdominal wound.  She was supposed to get her Entyvio injection on 18th of last month, but has not had it yet due to being in the hospital.   ED Course: she was afebrile with heart rates elevated up to 136 with respirations 10-26, and all other vital signs maintained.  Labs significant for WBC 11.9, hemoglobin 10.3 platelets 701, sodium 129, chloride 85, BUN 79, creatinine 4.94, anion gap 20, alkaline phosphatase 207, AST 51, ALT 71, total protein 9.1, lactic acid 2.5->2.1.  Influenza and COVID-19 screening was negative.  Blood cultures have been obtained and urinalysis was  pending.  Patient was given acetaminophen, 2 L of IV fluids, and started on a rate at 125 mL/h and admitted. Patient was managed with IV fluids for AKI, source from dehydration, no evidence of infection, heart AKI nicely improved sodium improved at this time tolerating diet, ileostomy output is a stable with liquidy stool.  She feels well she is being discharged home with home health and she will follow-up with her Crohn's disease specialist at [facility]  Discharge Diagnoses:   Acute kidney injury on CKD stage IV suspect due to dehydration, decreased oral intake with nausea vomiting, and also ileostomy output.  Ileostomy output at baseline.  Baseline creatinine ~ 2.1-2.5,  followed by Dr. Marval Regal from [facility].  Given IV fluid hydration and creatinine has nicely improved close to baseline we will repeat her labs prior to discharge today and she will follow-up with BMP in 1 week from PCP.  Recent Labs    12/07/20 2056 12/08/20 0305 12/09/20 0408 12/10/20 1105 12/18/20 1147 05/01/21 1514 05/14/21 0920 05/15/21 0547 05/16/21 0320 05/16/21 1357  BUN 35* 35* 29* 25* 35* 38* 79* 62* 43* 34*  CREATININE 2.66* 2.58* 2.33* 2.13* 2.24* 3.54* 4.94* 3.67* 2.67* 2.46*   Hyponatremia: Due to poor intake and ileostomy output.  Continue normal saline.  Improved. Recent Labs  Lab 05/14/21 0920 05/15/21 0547 05/16/21 0320 05/16/21 1357  NA 129* 136 138 137   SIRS-with tachycardia tachypnea mild leukocytosis lactic acidosis likely from dehydration.  Afebrile, leukocytosis resolved.  UA with WBC more  than 50-?  UTI -caution with antibiotics given her history of previous C. difficile -UA re-ordered as previous sample from.    History of C. difficile infection currently negative.  Anemia likely from chronic disease/CKD.  Noted drop to about 7.9 g,- ?hemodilution, baseline hemoglobin 9-10 gm in April.  Hemoglobin improved to 8.2, normal ferritin T01 and folic acid and anemia panel.  Add iron  supplementation as saturation is mildly low Recent Labs  Lab 05/14/21 0920 05/15/21 0547 05/16/21 0320  HGB 10.3* 7.9* 8.0*  HCT 32.0* 24.5* 25.3*    Lactic acidosis due to dehydration/anion gap metabolic acidosis Dehydration: Resolved, tolerating diet.  Lactic acid normalized.    Nausea/vomiting intermittent episodes: Resolved.  Tolerating diet.    Crohn's disease/ulcerative colitis: Patient s/p colectomy, ileal prostatectomy, and J-pouch revision in 1998 with proctitis.  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  Recently admitted for wound dehiscence with fistula formation 8/26, but was managed nonoperatively. Patient reports wound  is healing.  CT scan of the abdomen and pelvis without contrast did not show any acute findings.  She reports missing her dose Entyvio injection on 18th of last month. -Ostomy supplies as needed.  Continue wound care at home with home health nurse. will need follow-up with surgery at [facility] as outpatient  Essential hypertension controlled on her metoprolol  Generalized weakness PT OT consulted-planning for home health  Mild transaminitis monitor as outpatient  Hypomagnesemia: Replete with IV mag  Thrombocytosis-likely hemoconcentration.  Much better follow-up CBC in 1 week from PCP Recent Labs  Lab 05/14/21 0920 05/15/21 0547 05/16/21 0320  PLT 701* 489* 470*    Consults: none  Subjective: Alert awake resting comfortably tolerating diet, okay for  discharge home later  today  after repeat labs Discharge Exam: Vitals:   05/16/21 0603 05/16/21 0921  BP: (!) 147/88 136/77  Pulse: 92 80  Resp: 18 18  Temp: 98 F (36.7 C) 97.9 F (36.6 C)  SpO2: 98% 98%   General: Pt is alert, awake, not in acute distress Cardiovascular: RRR, S1/S2 +, no rubs, no gallops Respiratory: CTA bilaterally, no wheezing, no rhonchi Abdominal: Soft, NT, ND, bowel sounds + Extremities: no edema, no cyanosis  Discharge  Instructions  Discharge Instructions     Discharge instructions   Complete by: As directed    Check CBC BMP in 1 week from PCP.  Please call call MD or return to ER for similar or worsening recurring problem that brought you to hospital or if any fever,nausea/vomiting,abdominal pain, uncontrolled pain, chest pain,  shortness of breath or any other alarming symptoms.  Please follow-up your doctor as instructed in a week time and call the office for appointment.  Please avoid alcohol, smoking, or any other illicit substance and maintain healthy habits including taking your regular medications as prescribed.  You were cared for by a hospitalist during your hospital stay. If you have any questions about your discharge medications or the care you received while you were in the hospital after you are discharged, you can call the unit and ask to speak with the hospitalist on call if the hospitalist that took care of you is not available.  Once you are discharged, your primary care physician will handle any further medical issues. Please note that NO REFILLS for any discharge medications will be authorized once you are discharged, as it is imperative that you return to your primary care physician (or establish a relationship with a primary care  physician if you do not have one) for your aftercare needs so that they can reassess your need for medications and monitor your lab values   Discharge wound care:   Complete by: As directed    Wound care to midline incision with dehiscence at the superior aspect:  Cleanse with NS, pat dry.  Fill defect with a small piece of silver hydrofiber (Aquacel Ag+ Advantage, Kellie Simmering # [id number]). Top with a dry dressing and secure with Medipore tape.  Change daily   Increase activity slowly   Complete by: As directed       Allergies as of 05/16/2021       Reactions   Sulfa Antibiotics Hives, Itching, Rash, Swelling   Morphine Other (See Comments)   "Gives me crazy  dreams" (delusions, also)   Sulfonamide Derivatives Hives, Itching, Swelling        Medication List     TAKE these medications    acetaminophen 500 MG tablet Commonly known as: TYLENOL Take 500-1,000 mg by mouth at bedtime.   allopurinol 300 MG tablet Commonly known as: ZYLOPRIM TAKE 1 TABLET BY MOUTH EVERY DAY What changed: when to take this   DULoxetine 60 MG capsule Commonly known as: CYMBALTA Take 2 capsules (120 mg total) by mouth daily. What changed: when to take this   Entyvio 300 MG injection Generic drug: vedolizumab Inject 300 mg into the vein every 28 (twenty-eight) days.   famotidine 20 MG tablet Commonly known as: PEPCID TAKE 1 TABLET BY MOUTH TWICE A DAY What changed:  when to take this reasons to take this   folic acid 1 MG tablet Commonly known as: FOLVITE Take 2 tablets (2 mg total) by mouth daily. What changed: how much to take   gabapentin 100 MG capsule Commonly known as: NEURONTIN TAKE 1 CAPSULE BY MOUTH THREE TIMES A DAY What changed: See the new instructions.   metoprolol succinate 100 MG 24 hr tablet Commonly known as: TOPROL-XL Take 50-100 mg by mouth See admin instructions. Take 100 mg by mouth in the morning and 50 mg at bedtime   NONFORMULARY OR COMPOUNDED ITEM NerveE for neuropathy   nystatin powder Commonly known as: MYCOSTATIN/NYSTOP Apply 1 application topically daily as needed for rash.   ondansetron 4 MG tablet Commonly known as: ZOFRAN Take 1 tablet (4 mg total) by mouth every 6 (six) hours as needed for up to 20 doses for nausea.   oxyCODONE 5 MG immediate release tablet Commonly known as: Oxy IR/ROXICODONE Take 5 mg by mouth at bedtime as needed for moderate pain.   TUMS EXTRA STRENGTH 750 PO Take 1-2 tablets by mouth See admin instructions. Chew 1-2 tablets by mouth as needed for heartburn or reflux   Vitamin D (Ergocalciferol) 1.25 MG (50000 UNIT) Caps capsule Commonly known as: DRISDOL Take 50,000 Units by  mouth every 30 (thirty) days.               Discharge Care Instructions  (From admission, onward)           Start     Ordered   05/16/21 0000  Discharge wound care:       Comments: Wound care to midline incision with dehiscence at the superior aspect:  Cleanse with NS, pat dry.  Fill defect with a small piece of silver hydrofiber (Aquacel Ag+ Advantage, Kellie Simmering # [id number]). Top with a dry dressing and secure with Medipore tape.  Change daily   05/16/21 1157  Follow-up Information     Roma Schanz R, DO Follow up in 1 week(s).   Specialty: Family Medicine Why: need cbc and bmp in 1 wk Contact information: [address] [place] [address] [place] Follow up.   Specialty: Home Health Services Why: the office will call to schedule home health nursing visits. please allow 48 hours for agency to reach out. Contact information: [address] [place] [address] [phone]                Allergies  Allergen Reactions   Sulfa Antibiotics Hives, Itching, Rash and Swelling   Morphine Other (See Comments)    "Gives me crazy dreams" (delusions, also)   Sulfonamide Derivatives Hives, Itching and Swelling    The results of significant diagnostics from this hospitalization (including imaging, microbiology, ancillary and laboratory) are listed below for reference.    Microbiology: Recent Results (from the past 240 hour(s))  Blood culture (routine x 2)     Status: None (Preliminary result)   Collection Time: 05/14/21  9:21 AM   Specimen: Site Not Specified; Blood  Result Value Ref Range Status   Specimen Description SITE NOT SPECIFIED  Final   Special Requests   Final    BOTTLES DRAWN AEROBIC AND ANAEROBIC Blood Culture adequate volume   Culture   Final    NO GROWTH 2 DAYS Performed at [facility], [address] [place], [place] [address]    Report Status PENDING   Incomplete  Resp Panel by RT-PCR (Flu A&B, Covid) Nasopharyngeal Swab     Status: None   Collection Time: 05/14/21 12:41 PM   Specimen: Nasopharyngeal Swab; Nasopharyngeal(NP) swabs in vial transport medium  Result Value Ref Range Status   SARS Coronavirus 2 by RT PCR NEGATIVE NEGATIVE Final    Comment: (NOTE) SARS-CoV-2 target nucleic acids are NOT DETECTED.  The SARS-CoV-2 RNA is generally detectable in upper respiratory specimens during the acute phase of infection. The lowest concentration of SARS-CoV-2 viral copies this assay can detect is 138 copies/mL. A negative result does not preclude SARS-Cov-2 infection and should not be used as the sole basis for treatment or other patient management decisions. A negative result may occur with  improper specimen collection/handling, submission of specimen other than nasopharyngeal swab, presence of viral mutation(s) within the areas targeted by this assay, and inadequate number of viral copies(<138 copies/mL). A negative result must be combined with clinical observations, patient history, and epidemiological information. The expected result is Negative.  Fact Sheet for Patients:  EntrepreneurPulse.com.au  Fact Sheet for Healthcare Providers:  IncredibleEmployment.be  This test is no t yet approved or cleared by the [place] FDA and  has been authorized for detection and/or diagnosis of SARS-CoV-2 by FDA under an Emergency Use Authorization (EUA). This EUA will remain  in effect (meaning this test can be used) for the duration of the COVID-19 declaration under Section 564(b)(1) of the Act, 21 U.S.C.section 360bbb-3(b)(1), unless the authorization is terminated  or revoked sooner.       Influenza A by PCR NEGATIVE NEGATIVE Final   Influenza B by PCR NEGATIVE NEGATIVE Final    Comment: (NOTE) The Xpert Xpress SARS-CoV-2/FLU/RSV plus assay is intended as an aid in the diagnosis of influenza  from Nasopharyngeal swab specimens and should not be used as a sole basis for treatment. Nasal washings and aspirates are unacceptable  for Xpert Xpress SARS-CoV-2/FLU/RSV testing.  Fact Sheet for Patients: EntrepreneurPulse.com.au  Fact Sheet for Healthcare Providers: IncredibleEmployment.be  This test is not yet approved or cleared by the [place] FDA and has been authorized for detection and/or diagnosis of SARS-CoV-2 by FDA under an Emergency Use Authorization (EUA). This EUA will remain in effect (meaning this test can be used) for the duration of the COVID-19 declaration under Section 564(b)(1) of the Act, 21 U.S.C. section 360bbb-3(b)(1), unless the authorization is terminated or revoked.  Performed at [facility], [place] [address], [place], [place] [address]   Blood culture (routine x 2)     Status: None (Preliminary result)   Collection Time: 05/14/21  1:20 PM   Specimen: Site Not Specified; Blood  Result Value Ref Range Status   Specimen Description SITE NOT SPECIFIED  Final   Special Requests   Final    BOTTLES DRAWN AEROBIC AND ANAEROBIC Blood Culture adequate volume   Culture   Final    NO GROWTH 2 DAYS Performed at [facility], [place] [address] [place], [place] [address]    Report Status PENDING  Incomplete  C Difficile Quick Screen w PCR reflex     Status: None   Collection Time: 05/14/21  8:45 PM   Specimen: STOOL  Result Value Ref Range Status   C Diff antigen NEGATIVE NEGATIVE Final   C Diff toxin NEGATIVE NEGATIVE Final   C Diff interpretation No C. difficile detected.  Final    Comment: Performed at [facility], [place] [address], [name], [place] [address]    Procedures/Studies: CT ABDOMEN PELVIS WO CONTRAST  Result Date: 05/14/2021 CLINICAL DATA:  Acute generalized abdominal pain. EXAM: CT ABDOMEN AND PELVIS WITHOUT CONTRAST TECHNIQUE: Multidetector CT imaging of the abdomen and pelvis was  performed following the standard protocol without IV contrast. COMPARISON:  May 01, 2021. FINDINGS: Lower chest: No acute abnormality. Hepatobiliary: No focal liver abnormality is seen. Status post cholecystectomy. No biliary dilatation. Pancreas: Unremarkable. No pancreatic ductal dilatation or surrounding inflammatory changes. Spleen: Normal in size without focal abnormality. Adrenals/Urinary Tract: Adrenal glands are unremarkable. Kidneys are normal, without renal calculi, focal lesion, or hydronephrosis. Bladder is unremarkable. Stomach/Bowel: Stomach is unremarkable. Ileostomy is noted in the right lower quadrant. Status post proctocolectomy. There is no evidence of abnormal bowel dilatation. Possible contrast extravasation or perforation noted along the anterior abdominal wall in midline surgical incision on prior exam can not be evaluated currently due to the lack of intravenous and oral contrast. Vascular/Lymphatic: Aortic atherosclerosis. No enlarged abdominal or pelvic lymph nodes. Reproductive: Status post hysterectomy. No adnexal masses. Other: No ascites or fluid collection is noted. Musculoskeletal: No acute or significant osseous findings. IMPRESSION: Status post proctocolectomy. Ileostomy is noted in right lower quadrant. There is no evidence of bowel obstruction seen at this time. No definite acute abnormality is seen in the abdomen or pelvis at this time. Aortic Atherosclerosis (ICD10-I70.0). Electronically Signed   By: Marijo Conception M.D.   On: 05/14/2021 15:03   CT ABDOMEN PELVIS WO CONTRAST  Result Date: 05/01/2021 CLINICAL DATA:  Status post ileostomy on August 18th with drainage from incision site. EXAM: CT ABDOMEN AND PELVIS WITHOUT CONTRAST TECHNIQUE: Multidetector CT imaging of the abdomen and pelvis was performed following the standard protocol without IV contrast. COMPARISON:  December 07, 2020 FINDINGS: Lower chest: Mild atelectasis is seen along the posterior aspects of the  bilateral lung bases. Hepatobiliary: No focal liver abnormality is seen.  Status post cholecystectomy. No biliary dilatation. Pancreas: Unremarkable. No pancreatic ductal dilatation or surrounding inflammatory changes. Spleen: Normal in size without focal abnormality. Adrenals/Urinary Tract: Adrenal glands are unremarkable. Kidneys are normal, without renal calculi, focal lesion, or hydronephrosis. Bladder is unremarkable. Stomach/Bowel: There is a small hiatal hernia. Surgically anastomosed bowel is seen within the inferior aspect of the pelvis and anterior aspect of the lower abdomen. An ostomy site is seen within the mid to lower right abdominal wall. Contrast is seen throughout the small bowel as well as within the right lower quadrant ostomy site. No evidence of bowel dilatation. Vascular/Lymphatic: Aortic atherosclerosis. No enlarged abdominal or pelvic lymph nodes. Reproductive: Status post hysterectomy. No adnexal masses. Other: A surgical scar seen along the midline of the anterior abdominal wall this contains a mild amount of contrast attenuation (axial CT image 38 through 40, CT series 2). A few tiny foci of air attenuation are also noted within this surgical scar (axial CT image 39, CT series 2). A small area of extraluminal contrast and air is seen within the anterior aspect of the mid abdomen, adjacent to the surgical scar (axial CT images 38 through 42, CT series 2/sagittal reformatted images 95 through 101, CT series 6). No abdominopelvic ascites. Musculoskeletal: No acute or significant osseous findings. IMPRESSION: 1. Findings worrisome for an area of focal bowel perforation versus anastomosis leak within the anterior aspect of the mid abdomen, with extravasation of air and contrast into the adjacent surgical incision site. 2. Postoperative changes within subsequent right lower quadrant ileostomy. 3. Aortic atherosclerosis. Aortic Atherosclerosis (ICD10-I70.0). Electronically Signed   By: Virgina Norfolk M.D.   On: 05/01/2021 19:05   DG Chest 2 View  Result Date: 05/14/2021 CLINICAL DATA:  Weakness/nausea for few days,,no energy ,hx colostomy EXAM: CHEST - 2 VIEW COMPARISON:  12/07/2020 FINDINGS: Lungs are clear. Heart size and mediastinal contours are within normal limits. No effusion. Visualized bones unremarkable.  Surgical clips right upper quadrant. IMPRESSION: No acute cardiopulmonary disease. Electronically Signed   By: Lucrezia Europe M.D.   On: 05/14/2021 09:52    Labs: BNP (last 3 results) No results for input(s): BNP in the last 8760 hours. Basic Metabolic Panel: Recent Labs  Lab 05/14/21 0920 05/15/21 0547 05/16/21 0320 05/16/21 1357  NA 129* 136 138 137  K 3.9 4.4 3.5 3.9  CL 85* 103 106 107  CO2 24 24 22 22   GLUCOSE 124* 102* 99 114*  BUN 79* 62* 43* 34*  CREATININE 4.94* 3.67* 2.67* 2.46*  CALCIUM 10.1 8.5* 8.5* 8.1*  MG  --  1.4*  --   --    Liver Function Tests: Recent Labs  Lab 05/14/21 0920 05/15/21 0547  AST 51* 44*  ALT 71* 53*  ALKPHOS 207* 144*  BILITOT 0.9 0.7  PROT 9.1* 6.4*  ALBUMIN 3.4* 2.5*   No results for input(s): LIPASE, AMYLASE in the last 168 hours. No results for input(s): AMMONIA in the last 168 hours. CBC: Recent Labs  Lab 05/14/21 0920 05/15/21 0547 05/16/21 0320  WBC 11.9* 8.7 6.6  NEUTROABS 7.3  --   --   HGB 10.3* 7.9* 8.0*  HCT 32.0* 24.5* 25.3*  MCV 98.5 101.2* 102.8*  PLT 701* 489* 470*   Cardiac Enzymes: No results for input(s): CKTOTAL, CKMB, CKMBINDEX, TROPONINI in the last 168 hours. BNP: Invalid input(s): POCBNP CBG: No results for input(s): GLUCAP in the last 168 hours. D-Dimer No results for input(s): DDIMER in the last 72 hours. Hgb A1c No  results for input(s): HGBA1C in the last 72 hours. Lipid Profile No results for input(s): CHOL, HDL, LDLCALC, TRIG, CHOLHDL, LDLDIRECT in the last 72 hours. Thyroid function studies No results for input(s): TSH, T4TOTAL, T3FREE, THYROIDAB in the last 72  hours.  Invalid input(s): FREET3 Anemia work up Recent Labs    05/15/21 1027  VITAMINB12 782  FOLATE 26.5  FERRITIN 220  TIBC 350  IRON 36  RETICCTPCT 1.4   Urinalysis    Component Value Date/Time   COLORURINE YELLOW 05/15/2021 1800   APPEARANCEUR HAZY (A) 05/15/2021 1800   LABSPEC 1.010 05/15/2021 1800   PHURINE 6.0 05/15/2021 1800   GLUCOSEU NEGATIVE 05/15/2021 1800   GLUCOSEU NEGATIVE 03/14/2009 1603   HGBUR SMALL (A) 05/15/2021 1800   BILIRUBINUR NEGATIVE 05/15/2021 1800   BILIRUBINUR negative 12/18/2020 1151   KETONESUR NEGATIVE 05/15/2021 1800   PROTEINUR NEGATIVE 05/15/2021 1800   UROBILINOGEN negative (A) 12/18/2020 1151   UROBILINOGEN 0.2 04/12/2013 0900   NITRITE NEGATIVE 05/15/2021 1800   LEUKOCYTESUR NEGATIVE 05/15/2021 1800   Sepsis Labs Invalid input(s): PROCALCITONIN,  WBC,  LACTICIDVEN Microbiology Recent Results (from the past 240 hour(s))  Blood culture (routine x 2)     Status: None (Preliminary result)   Collection Time: 05/14/21  9:21 AM   Specimen: Site Not Specified; Blood  Result Value Ref Range Status   Specimen Description SITE NOT SPECIFIED  Final   Special Requests   Final    BOTTLES DRAWN AEROBIC AND ANAEROBIC Blood Culture adequate volume   Culture   Final    NO GROWTH 2 DAYS Performed at [facility], [place] [address] [place], [place] [address]    Report Status PENDING  Incomplete  Resp Panel by RT-PCR (Flu A&B, Covid) Nasopharyngeal Swab     Status: None   Collection Time: 05/14/21 12:41 PM   Specimen: Nasopharyngeal Swab; Nasopharyngeal(NP) swabs in vial transport medium  Result Value Ref Range Status   SARS Coronavirus 2 by RT PCR NEGATIVE NEGATIVE Final    Comment: (NOTE) SARS-CoV-2 target nucleic acids are NOT DETECTED.  The SARS-CoV-2 RNA is generally detectable in upper respiratory specimens during the acute phase of infection. The lowest concentration of SARS-CoV-2 viral copies this assay can detect is 138  copies/mL. A negative result does not preclude SARS-Cov-2 infection and should not be used as the sole basis for treatment or other patient management decisions. A negative result may occur with  improper specimen collection/handling, submission of specimen other than nasopharyngeal swab, presence of viral mutation(s) within the areas targeted by this assay, and inadequate number of viral copies(<138 copies/mL). A negative result must be combined with clinical observations, patient history, and epidemiological information. The expected result is Negative.  Fact Sheet for Patients:  EntrepreneurPulse.com.au  Fact Sheet for Healthcare Providers:  IncredibleEmployment.be  This test is no t yet approved or cleared by the [place] FDA and  has been authorized for detection and/or diagnosis of SARS-CoV-2 by FDA under an Emergency Use Authorization (EUA). This EUA will remain  in effect (meaning this test can be used) for the duration of the COVID-19 declaration under Section 564(b)(1) of the Act, 21 U.S.C.section 360bbb-3(b)(1), unless the authorization is terminated  or revoked sooner.       Influenza A by PCR NEGATIVE NEGATIVE Final   Influenza B by PCR NEGATIVE NEGATIVE Final    Comment: (NOTE) The Xpert Xpress SARS-CoV-2/FLU/RSV plus assay is intended as an aid in the diagnosis of influenza from Nasopharyngeal swab specimens  and should not be used as a sole basis for treatment. Nasal washings and aspirates are unacceptable for Xpert Xpress SARS-CoV-2/FLU/RSV testing.  Fact Sheet for Patients: EntrepreneurPulse.com.au  Fact Sheet for Healthcare Providers: IncredibleEmployment.be  This test is not yet approved or cleared by the [place] FDA and has been authorized for detection and/or diagnosis of SARS-CoV-2 by FDA under an Emergency Use Authorization (EUA). This EUA will remain in effect (meaning  this test can be used) for the duration of the COVID-19 declaration under Section 564(b)(1) of the Act, 21 U.S.C. section 360bbb-3(b)(1), unless the authorization is terminated or revoked.  Performed at [facility], [place] [address] [place], [place] [address]   Blood culture (routine x 2)     Status: None (Preliminary result)   Collection Time: 05/14/21  1:20 PM   Specimen: Site Not Specified; Blood  Result Value Ref Range Status   Specimen Description SITE NOT SPECIFIED  Final   Special Requests   Final    BOTTLES DRAWN AEROBIC AND ANAEROBIC Blood Culture adequate volume   Culture   Final    NO GROWTH 2 DAYS Performed at [facility], [place] [address], [place], [place] [address]    Report Status PENDING  Incomplete  C Difficile Quick Screen w PCR reflex     Status: None   Collection Time: 05/14/21  8:45 PM   Specimen: STOOL  Result Value Ref Range Status   C Diff antigen NEGATIVE NEGATIVE Final   C Diff toxin NEGATIVE NEGATIVE Final   C Diff interpretation No C. difficile detected.  Final    Comment: Performed at [facility], [place] [address], [place], [place] [address]     Time coordinating discharge: 25 minutes  SIGNED: Antonieta Pert, MD  [facility] 05/16/2021, 4:14 PM  If 7PM-7AM, please contact night-coverage [email or web address]

## 2021-05-16 NOTE — Plan of Care (Signed)
  Problem: Education: Goal: Knowledge of General Education information will improve Description: Including pain rating scale, medication(s)/side effects and non-pharmacologic comfort measures 05/16/2021 1557 by Vira Agar, RN Outcome: Completed/Met 05/16/2021 1452 by Vira Agar, RN Outcome: Progressing   Problem: Health Behavior/Discharge Planning: Goal: Ability to manage health-related needs will improve Outcome: Completed/Met   Problem: Clinical Measurements: Goal: Ability to maintain clinical measurements within normal limits will improve Outcome: Completed/Met Goal: Will remain free from infection Outcome: Completed/Met Goal: Diagnostic test results will improve Outcome: Completed/Met Goal: Respiratory complications will improve Outcome: Completed/Met Goal: Cardiovascular complication will be avoided Outcome: Completed/Met   Problem: Activity: Goal: Risk for activity intolerance will decrease 05/16/2021 1557 by Vira Agar, RN Outcome: Completed/Met 05/16/2021 1452 by Vira Agar, RN Outcome: Progressing   Problem: Nutrition: Goal: Adequate nutrition will be maintained 05/16/2021 1557 by Vira Agar, RN Outcome: Completed/Met 05/16/2021 1452 by Vira Agar, RN Outcome: Progressing   Problem: Coping: Goal: Level of anxiety will decrease Outcome: Completed/Met   Problem: Elimination: Goal: Will not experience complications related to bowel motility Outcome: Completed/Met Goal: Will not experience complications related to urinary retention Outcome: Completed/Met   Problem: Pain Managment: Goal: General experience of comfort will improve Outcome: Completed/Met   Problem: Safety: Goal: Ability to remain free from injury will improve 05/16/2021 1557 by Vira Agar, RN Outcome: Completed/Met 05/16/2021 1452 by Vira Agar, RN Outcome: Progressing   Problem: Skin Integrity: Goal: Risk for impaired skin integrity will decrease Outcome:  Completed/Met

## 2021-05-16 NOTE — Progress Notes (Signed)
DISCHARGE NOTE HOME [name] to be discharged Home per MD order. Discussed prescriptions and follow up appointments with the patient. Prescriptions given to patient; medication list explained in detail. Patient verbalized understanding.  Skin clean, dry and intact without evidence of skin break down, no evidence of skin tears noted. IV catheter discontinued intact. Site without signs and symptoms of complications. Dressing and pressure applied. Pt denies pain at the site currently. No complaints noted.  Patient free of lines, drains, and wounds.   An After Visit Summary (AVS) was printed and given to the patient. Patient escorted via wheelchair, and discharged home via private auto.  Vira Agar, RN

## 2021-05-16 NOTE — TOC Transition Note (Signed)
Transition of Care [facility]) - CM/SW Discharge Note   Patient Details  Name: Thea Holshouser MRN: [id number] Date of Birth: 1956-08-19  Transition of Care [facility]) CM/SW Contact:  Bartholomew Crews, RN Phone Number: [phone] 05/16/2021, 2:06 PM   Clinical Narrative:     [facility] Barnacle [facility] today. [place] RN orders placed per provider. No further TOC needs identified.   Final next level of care: [place] Barriers to Discharge: No Barriers Identified   Patient Goals and CMS Choice Patient states their goals for this hospitalization and ongoing recovery are:: To go home CMS Medicare.gov Compare Post Acute Care list provided to:: Patient Choice offered to / list presented to : Patient  Discharge Placement                       Discharge Plan and Services   Discharge Planning Services: CM Consult Post Acute Care Choice: Home Health                    HH Arranged: RN [place]: [facility] Date [place]: 05/16/21 Time [place]: [id number] Representative spoke with at [place]: [place] ([place]) Interventions     Readmission Risk Interventions Readmission Risk Prevention Plan 05/15/2021 12/10/2020  Transportation Screening Complete Complete  PCP or Specialist Appt within 3-5 Days - Complete  HRI or [place] Complete Complete  Social Work Consult for [place] Planning/Counseling Complete Complete  Palliative Care Screening Not Applicable Not Applicable  Medication Review Press photographer) - Complete  Some recent data might be hidden

## 2021-05-16 NOTE — Plan of Care (Signed)

## 2021-05-18 ENCOUNTER — Other Ambulatory Visit: Payer: Self-pay | Admitting: Family Medicine

## 2021-05-18 ENCOUNTER — Telehealth: Payer: Self-pay

## 2021-05-18 NOTE — Telephone Encounter (Signed)
Transition Care Management Unsuccessful Follow-up Telephone Call  Date of discharge and from where:  05/16/2021-[facility]  Attempts:  1st Attempt  Reason for unsuccessful TCM follow-up call:  No answer/busy

## 2021-05-19 ENCOUNTER — Telehealth: Payer: Self-pay

## 2021-05-19 DIAGNOSIS — D631 Anemia in chronic kidney disease: Secondary | ICD-10-CM | POA: Diagnosis not present

## 2021-05-19 DIAGNOSIS — E876 Hypokalemia: Secondary | ICD-10-CM | POA: Diagnosis not present

## 2021-05-19 DIAGNOSIS — E8881 Metabolic syndrome: Secondary | ICD-10-CM | POA: Diagnosis not present

## 2021-05-19 DIAGNOSIS — R768 Other specified abnormal immunological findings in serum: Secondary | ICD-10-CM | POA: Diagnosis not present

## 2021-05-19 DIAGNOSIS — I129 Hypertensive chronic kidney disease with stage 1 through stage 4 chronic kidney disease, or unspecified chronic kidney disease: Secondary | ICD-10-CM | POA: Diagnosis not present

## 2021-05-19 DIAGNOSIS — N184 Chronic kidney disease, stage 4 (severe): Secondary | ICD-10-CM | POA: Diagnosis not present

## 2021-05-19 DIAGNOSIS — D509 Iron deficiency anemia, unspecified: Secondary | ICD-10-CM | POA: Diagnosis not present

## 2021-05-19 DIAGNOSIS — K5090 Crohn's disease, unspecified, without complications: Secondary | ICD-10-CM | POA: Diagnosis not present

## 2021-05-19 DIAGNOSIS — F32A Depression, unspecified: Secondary | ICD-10-CM | POA: Diagnosis not present

## 2021-05-19 DIAGNOSIS — N2581 Secondary hyperparathyroidism of renal origin: Secondary | ICD-10-CM | POA: Diagnosis not present

## 2021-05-19 DIAGNOSIS — N179 Acute kidney failure, unspecified: Secondary | ICD-10-CM | POA: Diagnosis not present

## 2021-05-19 DIAGNOSIS — M109 Gout, unspecified: Secondary | ICD-10-CM | POA: Diagnosis not present

## 2021-05-19 LAB — CULTURE, BLOOD (ROUTINE X 2)
Culture: NO GROWTH
Culture: NO GROWTH
Special Requests: ADEQUATE
Special Requests: ADEQUATE

## 2021-05-19 NOTE — Telephone Encounter (Signed)
Transition Care Management Follow-up Telephone Call Date of discharge and from where: 05/16/2021-[facility] How have you been since you were released from the hospital? Doing ok Any questions or concerns? No  Items Reviewed: Did the pt receive and understand the discharge instructions provided? Yes  Medications obtained and verified? Yes  Other? Yes  Any new allergies since your discharge? No  Dietary orders reviewed? Yes Do you have support at home? Yes   Home Care and Equipment/Supplies: Were home health services ordered? yes If so, what is the name of the agency? [facility]  Has the agency set up a time to come to the patient's home? yes Were any new equipment or medical supplies ordered?  No What is the name of the medical supply agency? N/a Were you able to get the supplies/equipment? not applicable Do you have any questions related to the use of the equipment or supplies? N/a  Functional Questionnaire: (I = Independent and D = Dependent) ADLs: I  Bathing/Dressing- I  Meal Prep- I  Eating- I  Maintaining continence- I  Transferring/Ambulation- I  Managing Meds- I  Follow up appointments reviewed:  PCP Hospital f/u appt confirmed? Yes  Scheduled to see Dr. Etter Sjogren on 05/25/21 @ 11:20. [facility] f/u appt confirmed? Yes  Scheduled to see Dr. Havery Moros on 05/20/21 @ 3:20. Are transportation arrangements needed? No  If their condition worsens, is the pt aware to call PCP or go to the Emergency Dept.? Yes Was the patient provided with contact information for the PCP's office or ED? Yes Was to pt encouraged to call back with questions or concerns? Yes

## 2021-05-19 NOTE — Telephone Encounter (Signed)
Transition Care Management Unsuccessful Follow-up Telephone Call  Date of discharge and from where:  05/16/2021[name]  Attempts:  2nd Attempt  Reason for unsuccessful TCM follow-up call:  No answer/busy

## 2021-05-20 ENCOUNTER — Ambulatory Visit (INDEPENDENT_AMBULATORY_CARE_PROVIDER_SITE_OTHER): Payer: Medicare Other | Admitting: Gastroenterology

## 2021-05-20 ENCOUNTER — Encounter: Payer: Self-pay | Admitting: Gastroenterology

## 2021-05-20 VITALS — BP 128/80 | HR 85 | Ht 63.0 in | Wt 188.5 lb

## 2021-05-20 DIAGNOSIS — T8130XA Disruption of wound, unspecified, initial encounter: Secondary | ICD-10-CM | POA: Diagnosis not present

## 2021-05-20 DIAGNOSIS — Z932 Ileostomy status: Secondary | ICD-10-CM

## 2021-05-20 DIAGNOSIS — K5080 Crohn's disease of both small and large intestine without complications: Secondary | ICD-10-CM | POA: Insufficient documentation

## 2021-05-20 DIAGNOSIS — K50819 Crohn's disease of both small and large intestine with unspecified complications: Secondary | ICD-10-CM

## 2021-05-20 DIAGNOSIS — K50818 Crohn's disease of both small and large intestine with other complication: Secondary | ICD-10-CM

## 2021-05-20 NOTE — Progress Notes (Signed)
HPI :  IBD history: 65 year old white female with IBD ( Crohn's) since 58.  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 She was never able to be put in remission prior to colectomy. She was only exposed to steroids prior to her surgery. Post-operatively, she was on Remicade for 3-4 years but did not help too much. She had been on 6MP remotely but did not work for her. She was on Humira but also did not put her into remission and she developed high antibody titers and it was stopped. Most recently on Entyvio and low dose 6MP. 6MP was stopped due to worsening anemia.  She reports multiple courses of antibiotics with some benefit historically.  She has been on budesonider with ? Relief. Father and daughter have UC.  She has seen Jeanne Ivan and Dr. 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 Most recently saw Dr. Renelda Mom at [facility] and had diverting ileostomy April 23 2021.   SINCE THE LAST VISIT   65 year old female here for follow-up visit.  She is now status post diverting ileostomy with Dr. Drue Flirt last month.  She has had a difficult course since the surgery.  She had concern for wound dehiscence versus fistulous tract at the superior portion of her incision few weeks ago.  She was admitted and sent to [facility] they did not think she had a fistula.  She has been wearing special bandages and wound care treatment and the superior portion has been healing.  She states she noticed some leakage from the mid to lower portion of the incision in recent days and is concerned about wound healing/risk for infection at that site.  She has absolutely no pain at the incision and otherwise has actually been feeling pretty well lately.  She states her ostomy output is appropriate at this time.  She was admitted about a week ago for worsening dehydration she was not  eating well and had increased ostomy output.  She states she is been doing better since that admission, keeping up with her fluid intake and she has been followed by her nephrologist, her kidney function had improved (she had acute on chronic kidney injury).  She has some discharge from her rectum but is clear and is not significant.  She states at some point she will have her J-pouch removed is unclear of the timeline for that, not for several months.  She otherwise had changed insurance recently and they did not cover her home Entyvio infusions and she is questioning if she can have that done at another facility or if we should continue it in general.  She does think she had significant benefit with Entyvio since starting it, unclear if she had some small bowel Crohn's versus just pouchitis causing her symptoms.  Prior workup: Pouchoscopy 03/31/17 - anal fissure, mild anal canal stenosis, focal ulceration of pouch and more proximal ileum but improved compared to previous   Pouchoscopy 01/21/20 -  Preparation of the colon was poor. - A suspected small perianal fistulous tract and anal canal stenosis found on digital rectal exam. - The ileal pouch has an angulated turn with very focal ulcerations, otherwise no inflammatory changes noted. biopsies obtained - Small area of nodularity along anal canal - biopsied to rule out AIN / condyloma - Suspected fistal of the distal pouch just proximal to anal  canal Overall, only one focal ulceration noted and no other inflammatory changes seen. Incidentally noted suspected new fistula     EGD 01/21/20 -  - A 3 cm hiatal hernia was present. - LA Grade A esophagitis was found 33 cm from the incisors - mild. - The exam of the esophagus was otherwise normal. No Barrett's esophagus. - Multiple small sessile polyps were found in the gastric fundus and in the gastric body, benign appearing, likely fundic gland polyps, a cluster was noted at the distal end of the  hernia sac. Biopsies were taken with a cold forceps for histology. - The exam of the stomach was otherwise normal. - The duodenal bulb and second portion of the duodenum were normal.   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 Cellulitis extends superiorly throughout the posterior subcutaneous fat, adjacent to the gluteal and posterior paraspinal muscles. No evidence of abscess.     MRE 06/25/20 - IMPRESSION: 1. Persistent inflammatory changes near the upper gluteal cleft bilaterally between the gluteus maximus muscles. No discrete drainable soft tissue abscess or intramuscular involvement. 2. No MR findings for acute inflammatory small-bowel disease. However, study is limited without contrast.   CT scan abdomen/ pelvis without contrast 05/01/21: IMPRESSION: 1. Findings worrisome for an area of focal bowel perforation versus anastomosis leak within the anterior aspect of the mid abdomen, with extravasation of air and contrast into the adjacent surgical incision site. 2. Postoperative changes within subsequent right lower quadrant ileostomy. 3. Aortic atherosclerosis.  CT scan abdomen / pelvis without contrast 05/14/21: IMPRESSION: Status post proctocolectomy. Ileostomy is noted in right lower quadrant. There is no evidence of bowel obstruction seen at this time.   No definite acute abnormality is seen in the abdomen or pelvis at this time.    Past Medical History:  Diagnosis Date   Abdominal pain    Hx   Allergy    Anal stenosis    Anemia    Anxiety    Arthritis    Asthma    patient does not have inhaler   Blood in stool    Hx   Blood in urine    Hx   Blood transfusion without reported diagnosis    Cataract    CKD (chronic kidney disease) stage 3, GFR 30-59 ml/min (HCC)    Crohn's colitis, other  complication (HCC)    De Quervain's tenosynovitis    Depression    Difficulty urinating    Hx   Easy bruising    Esophagitis    Fainting    History - resolved - due to dehydration   Fatigue    Hx   Fibroid    Hx   Gastric polyp    GERD (gastroesophageal reflux disease)    Hearing loss    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   Hemorrhoids, external    Hemorrhoids, internal    Herpes, genital    vaginal treated 07/05/12 and pt states is resolved   History of cervical dysplasia    History of small bowel obstruction    Hyperlipidemia    currently no meds   Hyperparathyroidism    Hypertension    Hypokalemia    Hx   IBD (inflammatory bowel disease)    initially colectomy for suspected UC, 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   Obesity  Ovarian cyst    Poor dental hygiene    Pulmonary nodule, right    right upper lobe   Rectal bleeding    Hx   Rectal pain    Hx   Renal insufficiency    CKD - stage 3   RLS (restless legs syndrome)    no meds   Tooth infection 11/2016   right low   Ulcerative colitis    Visual disturbance    wears glasses   Weakness generalized    Hx - patient denies generalized weakness   Wears dentures    upper only     Past Surgical History:  Procedure Laterality Date   ANAL DILATION     CERVICAL BIOPSY  W/ LOOP ELECTRODE EXCISION     CHOLECYSTECTOMY     COLONOSCOPY     [name]   fatty tumor removed from back     X 2   HEMORRHOID SURGERY     ILEOSTOMY CLOSURE     RESTORATIVE PROCTOCOLECTOMY     with insertion of ileoanal J Pouch with loop ileostomy   SHOULDER ARTHROSCOPY WITH CAPSULORRHAPHY Left 06/14/2019   Procedure: LEFT SHOULDER ARTHRSCOPIC REPAIR OF BONY BANKART FRACTURE;  Surgeon: Tania Ade, MD;  Location: [facility];  Service: Orthopedics;  Laterality: Left;  REQUEST 88 MINUTE   SIGMOIDOSCOPY     TOTAL ABDOMINAL HYSTERECTOMY  1998   TAH/LSO   TUBAL LIGATION     UPPER GASTROINTESTINAL ENDOSCOPY     [name]    Family History  Problem Relation Age of Onset   Ulcerative colitis Father    Hypertension Father    Heart attack Father    Hypertension Mother    Heart disease Mother        s/p pci   Ulcerative colitis Daughter    Irritable bowel syndrome Other        grandchildren   Diabetes Sister    Cancer Sister        uterine   Cancer Maternal Uncle        LUNG   Colon cancer Neg Hx    Esophageal cancer Neg Hx    Stomach cancer Neg Hx    Rectal cancer Neg Hx    Social History   Tobacco Use   Smoking status: Former    Packs/day: 1.00    Years: 4.00    Pack years: 4.00    Types: Cigarettes    Quit date: 05/21/1979    Years since quitting: 42.0   Smokeless tobacco: Never  Vaping Use   Vaping Use: Never used  Substance Use Topics   Alcohol use: No    Alcohol/week: 0.0 standard drinks   Drug use: No   Current Outpatient Medications  Medication Sig Dispense Refill   acetaminophen (TYLENOL) 500 MG tablet Take 500-1,000 mg by mouth at bedtime.     allopurinol (ZYLOPRIM) 300 MG tablet TAKE 1 TABLET BY MOUTH EVERY DAY 90 tablet 0   Calcium Carbonate Antacid (TUMS EXTRA STRENGTH 750 PO) Take 1-2 tablets by mouth See admin instructions. Chew 1-2 tablets by mouth as needed for heartburn or reflux     DULoxetine (CYMBALTA) 60 MG capsule Take 2 capsules (120 mg total) by mouth daily. (Patient taking differently: Take 120 mg by mouth at bedtime.) 180 capsule 1   ENTYVIO 300 MG injection Inject 300 mg into the vein every 28 (twenty-eight) days.     famotidine (PEPCID) 20 MG tablet TAKE 1 TABLET BY MOUTH TWICE A DAY (Patient taking  differently: Take 20 mg by mouth daily as needed for heartburn or indigestion.) 443 tablet 1   folic acid (FOLVITE) 1 MG tablet Take 2 tablets (2 mg total) by mouth daily. (Patient taking differently: Take 1-2 mg by mouth daily.) 180 tablet 3   gabapentin (NEURONTIN) 100 MG capsule TAKE 1 CAPSULE BY MOUTH THREE TIMES A DAY (Patient taking differently: Take 100 mg by  mouth 3 (three) times daily.) 90 capsule 2   metoprolol succinate (TOPROL-XL) 100 MG 24 hr tablet Take 50-100 mg by mouth See admin instructions. Take 100 mg by mouth in the morning and 50 mg at bedtime     nystatin (MYCOSTATIN/NYSTOP) powder Apply 1 application topically daily as needed for rash.     ondansetron (ZOFRAN) 4 MG tablet Take 1 tablet (4 mg total) by mouth every 6 (six) hours as needed for up to 20 doses for nausea. 20 tablet 0   oxyCODONE (OXY IR/ROXICODONE) 5 MG immediate release tablet Take 5 mg by mouth at bedtime as needed for moderate pain.     Vitamin D, Ergocalciferol, (DRISDOL) 1.25 MG (50000 UNIT) CAPS capsule Take 50,000 Units by mouth every 30 (thirty) days.     No current facility-administered medications for this visit.   Allergies  Allergen Reactions   Sulfa Antibiotics Hives, Itching, Rash and Swelling   Morphine Other (See Comments)    "Gives me crazy dreams" (delusions, also)   Sulfonamide Derivatives Hives, Itching and Swelling     Review of Systems: All systems reviewed and negative except where noted in HPI.    CT ABDOMEN PELVIS WO CONTRAST  Result Date: 05/14/2021 CLINICAL DATA:  Acute generalized abdominal pain. EXAM: CT ABDOMEN AND PELVIS WITHOUT CONTRAST TECHNIQUE: Multidetector CT imaging of the abdomen and pelvis was performed following the standard protocol without IV contrast. COMPARISON:  May 01, 2021. FINDINGS: Lower chest: No acute abnormality. Hepatobiliary: No focal liver abnormality is seen. Status post cholecystectomy. No biliary dilatation. Pancreas: Unremarkable. No pancreatic ductal dilatation or surrounding inflammatory changes. Spleen: Normal in size without focal abnormality. Adrenals/Urinary Tract: Adrenal glands are unremarkable. Kidneys are normal, without renal calculi, focal lesion, or hydronephrosis. Bladder is unremarkable. Stomach/Bowel: Stomach is unremarkable. Ileostomy is noted in the right lower quadrant. Status post  proctocolectomy. There is no evidence of abnormal bowel dilatation. Possible contrast extravasation or perforation noted along the anterior abdominal wall in midline surgical incision on prior exam can not be evaluated currently due to the lack of intravenous and oral contrast. Vascular/Lymphatic: Aortic atherosclerosis. No enlarged abdominal or pelvic lymph nodes. Reproductive: Status post hysterectomy. No adnexal masses. Other: No ascites or fluid collection is noted. Musculoskeletal: No acute or significant osseous findings. IMPRESSION: Status post proctocolectomy. Ileostomy is noted in right lower quadrant. There is no evidence of bowel obstruction seen at this time. No definite acute abnormality is seen in the abdomen or pelvis at this time. Aortic Atherosclerosis (ICD10-I70.0). Electronically Signed   By: Marijo Conception M.D.   On: 05/14/2021 15:03   CT ABDOMEN PELVIS WO CONTRAST  Result Date: 05/01/2021 CLINICAL DATA:  Status post ileostomy on August 18th with drainage from incision site. EXAM: CT ABDOMEN AND PELVIS WITHOUT CONTRAST TECHNIQUE: Multidetector CT imaging of the abdomen and pelvis was performed following the standard protocol without IV contrast. COMPARISON:  December 07, 2020 FINDINGS: Lower chest: Mild atelectasis is seen along the posterior aspects of the bilateral lung bases. Hepatobiliary: No focal liver abnormality is seen. Status post cholecystectomy. No biliary dilatation. Pancreas: Unremarkable.  No pancreatic ductal dilatation or surrounding inflammatory changes. Spleen: Normal in size without focal abnormality. Adrenals/Urinary Tract: Adrenal glands are unremarkable. Kidneys are normal, without renal calculi, focal lesion, or hydronephrosis. Bladder is unremarkable. Stomach/Bowel: There is a small hiatal hernia. Surgically anastomosed bowel is seen within the inferior aspect of the pelvis and anterior aspect of the lower abdomen. An ostomy site is seen within the mid to lower right  abdominal wall. Contrast is seen throughout the small bowel as well as within the right lower quadrant ostomy site. No evidence of bowel dilatation. Vascular/Lymphatic: Aortic atherosclerosis. No enlarged abdominal or pelvic lymph nodes. Reproductive: Status post hysterectomy. No adnexal masses. Other: A surgical scar seen along the midline of the anterior abdominal wall this contains a mild amount of contrast attenuation (axial CT image 38 through 40, CT series 2). A few tiny foci of air attenuation are also noted within this surgical scar (axial CT image 39, CT series 2). A small area of extraluminal contrast and air is seen within the anterior aspect of the mid abdomen, adjacent to the surgical scar (axial CT images 38 through 42, CT series 2/sagittal reformatted images 95 through 101, CT series 6). No abdominopelvic ascites. Musculoskeletal: No acute or significant osseous findings. IMPRESSION: 1. Findings worrisome for an area of focal bowel perforation versus anastomosis leak within the anterior aspect of the mid abdomen, with extravasation of air and contrast into the adjacent surgical incision site. 2. Postoperative changes within subsequent right lower quadrant ileostomy. 3. Aortic atherosclerosis. Aortic Atherosclerosis (ICD10-I70.0). Electronically Signed   By: Virgina Norfolk M.D.   On: 05/01/2021 19:05   DG Chest 2 View  Result Date: 05/14/2021 CLINICAL DATA:  Weakness/nausea for few days,,no energy ,hx colostomy EXAM: CHEST - 2 VIEW COMPARISON:  12/07/2020 FINDINGS: Lungs are clear. Heart size and mediastinal contours are within normal limits. No effusion. Visualized bones unremarkable.  Surgical clips right upper quadrant. IMPRESSION: No acute cardiopulmonary disease. Electronically Signed   By: Lucrezia Europe M.D.   On: 05/14/2021 09:52    Lab Results  Component Value Date   WBC 6.6 05/16/2021   HGB 8.0 (L) 05/16/2021   HCT 25.3 (L) 05/16/2021   MCV 102.8 (H) 05/16/2021   PLT 470 (H)  05/16/2021    Lab Results  Component Value Date   CREATININE 2.46 (H) 05/16/2021   BUN 34 (H) 05/16/2021   NA 137 05/16/2021   K 3.9 05/16/2021   CL 107 05/16/2021   CO2 22 05/16/2021    Physical Exam: BP 128/80   Pulse 85 Comment: manual repeat  Ht 5' 3"  (1.6 m)   Wt 188 lb 8 oz (85.5 kg)   SpO2 98%   BMI 33.39 kg/m  Constitutional: Pleasant,well-developed, female in no acute distress. Abdominal: Soft, nondistended, nontender. Ileostomy in RLQ with liquid stool. Incision with some breakdown opening in lower mid portion with serous drainage, no purulence Extremities: no edema Lymphadenopathy: No cervical adenopathy noted. Neurological: Alert and oriented to person place and time. Skin: Skin is warm and dry. No rashes noted. Psychiatric: Normal mood and affect. Behavior is normal.   ASSESSMENT AND PLAN: 65 year old female here for reassessment of following:  Crohn's disease History of pouchitis Wound dehiscence  As above she had a complicated history with refractory pouchitis and ongoing perianal leakage with fistulas despite a variety of medical therapies.  Eventually had a diverting ileostomy last month with plans for pouch excision eventually.  She had a rough course with wound dehiscence versus fistulous  connection at the incision, the superior portion of this has healed nicely compared to pictures of what she showed previously.  Unfortunately now with some drainage and either dehiscence versus fistula at the left mid to lower portion of the incision.  Recent CT scan did not show a clear fistula although was limited by lack of contrast.  She had admission for dehydration and high ostomy output a week ago but has since been doing better with that.  Pulse between 80s and 100 today.  BP stable.  Discussed situation with the patient.  She allowed me to take picture of her incision and I sent this to Dr. Drue Flirt, will await her recommendations as she has been following her  closely for this issue.  She has a wound care/ostomy nurse and needs to contact them and have them evaluate this as soon as possible.  She think she can be seen tomorrow.  He does not appear to be an obvious abscess or infection at this time but she needs to keep an eye on output, pain, fevers etc. and seek evaluation if this worsens while awaiting wound care evaluation.  I also asked her to contact Dr. Drue Flirt about follow-up with her office in the near future.  We discussed Entyvio and her course with this in the past.  Now with change of insurance she cannot get this at home any longer.  Unclear if we will need to continue this moving forward but possible she has had Crohn's disease and not just pouchitis leading to this presentation previously.  I will reach out to the [facility] infusion center to see how much Entyvio would be for her to take, her preference is to continue it for now.  Ideally I would like her to recover from her surgery and we can do ileoscopy to assess for small bowel, we can plan for that in the upcoming months.  Plan: - patient is to seek evaluation with her wound care/ostomy nurse ASAP, and Dr. Geronimo Boot office for this new issue of drainage at her wound, unclear if wound dehiscence versus developing fistula.  The superior portion of this was more dramatically open compared to previous and has been healing with conservative measures, would think fistula perhaps less likely at this time. Hopefully will heal with more time, no obvious infection currently but if pain / fevers, etc she needs to contact us or seek evaluation - I will reach out to [facility] to see how well [name] would be covered for her and continue it if possible. - follow up in a few months, plan for ileoscopy once healed from her surgery   Jolly Mango, MD [facility]

## 2021-05-21 ENCOUNTER — Telehealth: Payer: Self-pay | Admitting: Pharmacy Technician

## 2021-05-21 ENCOUNTER — Other Ambulatory Visit: Payer: Self-pay | Admitting: Pharmacy Technician

## 2021-05-21 NOTE — Telephone Encounter (Signed)
Dr. Havery Moros,  Auth Submission: NO AUTH REQUIRED Payer: [place] Medication & CPT/J Code(s) submitted: Entyvio (Vedolizumab) J3380 Route of submission (phone, fax, portal): PHONE [phone] Auth type: Buy/Bill Units/visits requested: 8 Reference number: [id number]  Deductible: patient has met $233 deductible. Medicare will cover 80% and secondary insurance will pick-up remaining 20%.  [employer]: active REF# [id number]  Patient will be scheduled for Entyvio infusion

## 2021-05-21 NOTE — Telephone Encounter (Signed)
Thank you, the patient will be happy to hear this.

## 2021-05-22 ENCOUNTER — Telehealth: Payer: Self-pay | Admitting: Gastroenterology

## 2021-05-22 NOTE — Telephone Encounter (Signed)
[name] with [facility] wanted to make you aware that pt is going to be d/c today so she would be outpt. for infusion. Any questions pls call her  [phone].

## 2021-05-22 NOTE — Telephone Encounter (Signed)
Spoke with Sharrie Rothman at [facility], she just wanted to make sure that we were aware that patient will now be receiving Entyvio as outpatient. Advised that she was seen by Dr. Havery Moros this week and he is working on getting approval at the [facility] infusion center. Sharrie Rothman states that she will fax over the d/c papers. Sharrie Rothman had no concerns at the end of the call.

## 2021-05-25 ENCOUNTER — Ambulatory Visit (INDEPENDENT_AMBULATORY_CARE_PROVIDER_SITE_OTHER): Payer: Medicare Other | Admitting: Family Medicine

## 2021-05-25 ENCOUNTER — Other Ambulatory Visit: Payer: Self-pay

## 2021-05-25 ENCOUNTER — Telehealth: Payer: Self-pay | Admitting: Pharmacy Technician

## 2021-05-25 ENCOUNTER — Encounter: Payer: Self-pay | Admitting: Family Medicine

## 2021-05-25 VITALS — BP 100/70 | HR 78 | Temp 98.6°F | Resp 18 | Ht 63.0 in | Wt 186.0 lb

## 2021-05-25 DIAGNOSIS — N184 Chronic kidney disease, stage 4 (severe): Secondary | ICD-10-CM

## 2021-05-25 DIAGNOSIS — T8131XA Disruption of external operation (surgical) wound, not elsewhere classified, initial encounter: Secondary | ICD-10-CM

## 2021-05-25 DIAGNOSIS — E860 Dehydration: Secondary | ICD-10-CM | POA: Diagnosis not present

## 2021-05-25 LAB — CBC WITH DIFFERENTIAL/PLATELET
Basophils Absolute: 0 10*3/uL (ref 0.0–0.1)
Basophils Relative: 0.4 % (ref 0.0–3.0)
Eosinophils Absolute: 0.3 10*3/uL (ref 0.0–0.7)
Eosinophils Relative: 3 % (ref 0.0–5.0)
HCT: 29 % — ABNORMAL LOW (ref 36.0–46.0)
Hemoglobin: 9.6 g/dL — ABNORMAL LOW (ref 12.0–15.0)
Lymphocytes Relative: 29.1 % (ref 12.0–46.0)
Lymphs Abs: 3 10*3/uL (ref 0.7–4.0)
MCHC: 33 g/dL (ref 30.0–36.0)
MCV: 99.8 fl (ref 78.0–100.0)
Monocytes Absolute: 0.9 10*3/uL (ref 0.1–1.0)
Monocytes Relative: 9.3 % (ref 3.0–12.0)
Neutro Abs: 6 10*3/uL (ref 1.4–7.7)
Neutrophils Relative %: 58.2 % (ref 43.0–77.0)
Platelets: 499 10*3/uL — ABNORMAL HIGH (ref 150.0–400.0)
RBC: 2.91 Mil/uL — ABNORMAL LOW (ref 3.87–5.11)
RDW: 15.9 % — ABNORMAL HIGH (ref 11.5–15.5)
WBC: 10.2 10*3/uL (ref 4.0–10.5)

## 2021-05-25 NOTE — Progress Notes (Signed)
Established Patient Office Visit  Subjective:  Patient ID: [name], female    DOB: 11-Feb-1956  Age: 65 y.o. MRN: [id number]  CC:  Chief Complaint  Patient presents with   Hospitalization Follow-up    AKI    HPI [name] presents for hosp f/u for aki and dehydration.  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 Forest Gleason had [facility], was readmitted 8/20 [birth year]29 first due to wound dehiscence with concern for fistula presented to the ED with weakness, managed nonoperatively. hx crohns dz --- She is struggling with keeping up with her fluid status--- she is drinking water, electrolyte drinks etc and still feels like she needs fluids regularly.     Past Medical History:  Diagnosis Date   Abdominal pain    Hx   Allergy    Anal stenosis    Anemia    Anxiety    Arthritis    Asthma    patient does not have inhaler   Blood in stool    Hx   Blood in urine    Hx   Blood transfusion without reported diagnosis    Cataract    CKD (chronic kidney disease) stage 3, GFR 30-59 ml/min (HCC)    Crohn's colitis, other complication (HCC)    De Quervain's tenosynovitis    Depression    Difficulty urinating    Hx   Easy bruising    Esophagitis    Fainting    History - resolved - due to dehydration   Fatigue    Hx   Fibroid    Hx   Gastric polyp    GERD (gastroesophageal reflux disease)    Hearing loss    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   Hemorrhoids, external    Hemorrhoids, internal    Herpes, genital    vaginal treated 07/05/12 and pt states is resolved   History of cervical dysplasia    History of small bowel obstruction    Hyperlipidemia    currently no meds   Hyperparathyroidism    Hypertension     Hypokalemia    Hx   IBD (inflammatory bowel disease)    initially colectomy for suspected UC, 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   Obesity    Ovarian cyst    Poor dental hygiene    Pulmonary nodule, right    right upper lobe   Rectal bleeding    Hx   Rectal pain    Hx   Renal insufficiency    CKD - stage 3   RLS (restless legs syndrome)    no meds   Tooth infection 11/2016   right low   Ulcerative colitis    Visual disturbance    wears glasses   Weakness generalized    Hx - patient denies generalized weakness   Wears dentures    upper only    Past Surgical History:  Procedure Laterality Date   ANAL DILATION     CERVICAL BIOPSY  W/ LOOP ELECTRODE EXCISION     CHOLECYSTECTOMY     COLONOSCOPY     [name]   fatty tumor removed from back     X  2   HEMORRHOID SURGERY     ILEOSTOMY CLOSURE     RESTORATIVE PROCTOCOLECTOMY     with insertion of ileoanal J Pouch with loop ileostomy   SHOULDER ARTHROSCOPY WITH CAPSULORRHAPHY Left 06/14/2019   Procedure: LEFT SHOULDER ARTHRSCOPIC REPAIR OF BONY BANKART FRACTURE;  Surgeon: Tania Ade, MD;  Location: [facility];  Service: Orthopedics;  Laterality: Left;  REQUEST 35 MINUTE   SIGMOIDOSCOPY     TOTAL ABDOMINAL HYSTERECTOMY  1998   TAH/LSO   TUBAL LIGATION     UPPER GASTROINTESTINAL ENDOSCOPY     [name]    Family History  Problem Relation Age of Onset   Ulcerative colitis Father    Hypertension Father    Heart attack Father    Hypertension Mother    Heart disease Mother        s/p pci   Ulcerative colitis Daughter    Irritable bowel syndrome Other        grandchildren   Diabetes Sister    Cancer Sister        uterine   Cancer Maternal Uncle        LUNG   Colon cancer Neg Hx    Esophageal cancer Neg Hx    Stomach cancer Neg Hx    Rectal cancer Neg Hx     Social History   Socioeconomic History   Marital status: Divorced    Spouse name: Not on file   Number of children: Not on file    Years of education: Not on file   Highest education level: Not on file  Occupational History   Not on file  Tobacco Use   Smoking status: Former    Packs/day: 1.00    Years: 4.00    Pack years: 4.00    Types: Cigarettes    Quit date: 05/21/1979    Years since quitting: 42.0   Smokeless tobacco: Never  Vaping Use   Vaping Use: Never used  Substance and Sexual Activity   Alcohol use: No    Alcohol/week: 0.0 standard drinks   Drug use: No   Sexual activity: Yes    Birth control/protection: Post-menopausal    Comment: 1st intercourse 65 yo-Fewer than 5 partners  Other Topics Concern   Not on file  Social History Narrative   Not on file   Social Determinants of Health   Financial Resource Strain: Not on file  Food Insecurity: Not on file  Transportation Needs: Not on file  Physical Activity: Not on file  Stress: Not on file  Social Connections: Not on file  Intimate Partner Violence: Not on file    Outpatient Medications Prior to Visit  Medication Sig Dispense Refill   acetaminophen (TYLENOL) 500 MG tablet Take 500-1,000 mg by mouth at bedtime.     allopurinol (ZYLOPRIM) 300 MG tablet TAKE 1 TABLET BY MOUTH EVERY DAY (Patient taking differently: Take 300 mg by mouth daily.) 90 tablet 0   Calcium Carbonate Antacid (TUMS EXTRA STRENGTH 750 PO) Take 1-2 tablets by mouth See admin instructions. Chew 1-2 tablets by mouth as needed for heartburn or reflux     DULoxetine (CYMBALTA) 60 MG capsule Take 2 capsules (120 mg total) by mouth daily. (Patient taking differently: Take 120 mg by mouth at bedtime.) 180 capsule 1   ENTYVIO 300 MG injection Inject 300 mg into the vein every 28 (twenty-eight) days.     famotidine (PEPCID) 20 MG tablet TAKE 1 TABLET BY MOUTH TWICE A DAY (Patient taking differently: Take 20  mg by mouth daily as needed for heartburn or indigestion.) 071 tablet 1   folic acid (FOLVITE) 1 MG tablet Take 2 tablets (2 mg total) by mouth daily. (Patient taking differently:  Take 1-2 mg by mouth daily.) 180 tablet 3   gabapentin (NEURONTIN) 100 MG capsule TAKE 1 CAPSULE BY MOUTH THREE TIMES A DAY (Patient taking differently: Take 100 mg by mouth 3 (three) times daily.) 90 capsule 2   metoprolol succinate (TOPROL-XL) 100 MG 24 hr tablet Take 50-100 mg by mouth See admin instructions. Take 100 mg by mouth in the morning and 50 mg at bedtime     ondansetron (ZOFRAN) 4 MG tablet Take 1 tablet (4 mg total) by mouth every 6 (six) hours as needed for up to 20 doses for nausea. 20 tablet 0   oxyCODONE (OXY IR/ROXICODONE) 5 MG immediate release tablet Take 5 mg by mouth at bedtime as needed for moderate pain. (Patient not taking: Reported on 05/26/2021)     Vitamin D, Ergocalciferol, (DRISDOL) 1.25 MG (50000 UNIT) CAPS capsule Take 50,000 Units by mouth every 30 (thirty) days.     nystatin (MYCOSTATIN/NYSTOP) powder Apply 1 application topically daily as needed for rash.     No facility-administered medications prior to visit.    Allergies  Allergen Reactions   Sulfa Antibiotics Hives, Itching, Rash and Swelling   Morphine Other (See Comments)    "Gives me crazy dreams" (delusions, also)   Sulfonamide Derivatives Hives, Itching and Swelling    ROS Review of Systems  Constitutional:  Negative for appetite change, diaphoresis, fatigue and unexpected weight change.  Eyes:  Negative for pain, redness and visual disturbance.  Respiratory:  Negative for cough, chest tightness, shortness of breath and wheezing.   Cardiovascular:  Negative for chest pain, palpitations and leg swelling.  Endocrine: Negative for cold intolerance, heat intolerance, polydipsia, polyphagia and polyuria.  Genitourinary:  Negative for difficulty urinating, dysuria and frequency.  Skin:  Positive for wound.  Neurological:  Negative for dizziness, light-headedness, numbness and headaches.     Objective:    Physical Exam Vitals and nursing note reviewed.  Constitutional:      Appearance: She is  well-developed.  HENT:     Head: Normocephalic and atraumatic.  Eyes:     Conjunctiva/sclera: Conjunctivae normal.  Neck:     Thyroid: No thyromegaly.     Vascular: No carotid bruit or JVD.  Cardiovascular:     Rate and Rhythm: Normal rate and regular rhythm.     Heart sounds: Normal heart sounds. No murmur heard. Pulmonary:     Effort: Pulmonary effort is normal. No respiratory distress.     Breath sounds: Normal breath sounds. No wheezing or rales.  Chest:     Chest wall: No tenderness.  Musculoskeletal:     Cervical back: Normal range of motion and neck supple.  Skin:    Findings: Lesion present.  Neurological:     Mental Status: She is alert and oriented to person, place, and time.  Media Information Document Information  Photos    05/25/2021 11:39  Attached To:  Office Visit on 05/25/21 with Carollee Herter, Alferd Apa, DO   Source Information  Carollee Herter, Kendrick Fries R, DO  [facility]    BP 100/70 (BP Location: Left Arm, Patient Position: Sitting, Cuff Size: Normal)   Pulse 78   Temp 98.6 F (37 C) (Oral)   Resp 18   Ht 5' 3"  (1.6 m)   Wt 186 lb (84.4 kg)  SpO2 98%   BMI 32.95 kg/m  Wt Readings from Last 3 Encounters:  05/26/21 186 lb (84.4 kg)  05/25/21 186 lb (84.4 kg)  05/20/21 188 lb 8 oz (85.5 kg)     Health Maintenance Due  Topic Date Due   COVID-19 Vaccine (1) Never done   FOOT EXAM  Never done   OPHTHALMOLOGY EXAM  Never done   URINE MICROALBUMIN  Never done   Zoster Vaccines- Shingrix (1 of 2) Never done   COLONOSCOPY (Pts 45-5yr Insurance coverage will need to be confirmed)  06/02/2016   MAMMOGRAM  10/13/2019   HEMOGLOBIN A1C  02/21/2021   INFLUENZA VACCINE  04/06/2021    There are no preventive care reminders to display for this patient.  Lab Results  Component Value Date   TSH 4.18 02/26/2020   Lab Results  Component Value Date   WBC 10.3 05/26/2021   HGB 9.4 (L) 05/26/2021   HCT 29.6 (L) 05/26/2021   MCV 100.7 (H) 05/26/2021    PLT 415 (H) 05/26/2021   Lab Results  Component Value Date   NA 129 (L) 05/26/2021   K 5.4 (H) 05/26/2021   CO2 20 (L) 05/26/2021   GLUCOSE 101 (H) 05/26/2021   BUN 66 (H) 05/26/2021   CREATININE 4.08 (H) 05/26/2021   BILITOT 0.8 05/26/2021   ALKPHOS 198 (H) 05/26/2021   AST 60 (H) 05/26/2021   ALT 90 (H) 05/26/2021   PROT 8.7 (H) 05/26/2021   ALBUMIN 3.6 05/26/2021   CALCIUM 9.9 05/26/2021   ANIONGAP 15 05/26/2021   GFR 10.27 (LL) 05/25/2021   Lab Results  Component Value Date   CHOL 190 02/26/2020   Lab Results  Component Value Date   HDL 43.50 02/26/2020   Lab Results  Component Value Date   LDLCALC 116 (H) 02/26/2020   Lab Results  Component Value Date   TRIG 152.0 (H) 02/26/2020   Lab Results  Component Value Date   CHOLHDL 4 02/26/2020   Lab Results  Component Value Date   HGBA1C 5.6 08/23/2020      Assessment & Plan:   Problem List Items Addressed This Visit       Unprioritized   Dehydration - Primary   Relevant Orders   Basic metabolic panel (Completed)   CBC with Differential/Platelet (Completed)   Stage 4 [place] kidney disease [place]   Relevant Orders   Basic metabolic panel (Completed)   CBC with Differential/Platelet (Completed)   Surgical wound breakdown, initial encounter   Relevant Orders   Wound culture (Completed)    No orders of the defined types were placed in this encounter.   Follow-up: Return if symptoms worsen or fail to improve.    YAnn Held DO

## 2021-05-25 NOTE — Patient Instructions (Signed)
Ulcerative Colitis, Adult Ulcerative colitis is long-term (chronic) inflammation of the large intestine (colon) and rectum. Sores (ulcers) may also form in these areas. Ulcerative colitis, along with a closely related condition called Crohn's disease, is often referred to as inflammatory bowel disease. What are the causes? This condition may be caused by increased activity of the immune system in the intestines. The immune system is the system that protects the body against harmful bacteria, viruses, fungi, and other things that can make you sick. The cause of the increased activity of the immune system is not known. What increases the risk? The following factors may make you more likely to develop this condition: Being under 15 years old. Having a family history of ulcerative colitis. What are the signs or symptoms? Symptoms vary depending on how severe the condition is. Common symptoms include: Rectal bleeding. Diarrhea, often with blood or pus in the stool. Other symptoms can include: Pain or cramping in the abdomen. Fever. Tiredness (fatigue). Nausea, loss of appetite, or weight loss. Rectal pain. A strong and sudden need to have a bowel movement (bowel urgency). Anemia. Yellowing of the skin (jaundice) from liver dysfunction or skin rashes. Symptoms can range from mild to severe. They may come and go. How is this diagnosed? This condition may be diagnosed based on: Your symptoms and medical history. A physical exam. Tests, including: Blood tests and stool tests. X-rays. CT scan. MRI. Colonoscopy. For this test, a flexible tube is inserted into your anus, and your colon is examined. Biopsy. In this test, a tissue sample is taken from your colon and examined under a microscope. How is this treated? There is no cure for this condition, but it can be managed. Treatment depends on the severity of the disease. Treatment for this condition may include medicines to: Decrease swelling  and inflammation. Control your immune system. Treat infections. Relieve pain. Control diarrhea. Severe flare-ups may need to be treated at a hospital. Treatment in a hospital may involve: Resting the bowel. This involves not eating or drinking for a period of time. Getting medicines through an IV. Getting fluids and nutrition through: An IV. A tube that is passed through the nose and into the stomach (nasogastric or NG tube). Surgery to remove the affected part of the colon. This may be done if other treatments are not helping. This condition increases the risk of colon cancer. Adults with this condition will need to be checked for colon cancer throughout life. Follow these instructions at home: Medicines and vitamins Take over-the-counter and prescription medicines only as told by your health care provider. Do not take aspirin. If you were prescribed an antibiotic medicine, take it as told by your health care provider. Do not stop taking the antibiotic even if you start to feel better. Ask your health care provider if you should take any vitamins or supplements. You may need to take: Calcium and vitamin D for bone health. Iron to help treat anemia. Lifestyle Exercise regularly. Work with your health care provider to manage your condition and educate yourself about your condition. Do not use any products that contain nicotine or tobacco. These products include cigarettes, chewing tobacco, and vaping devices, such as e-cigarettes. If you need help quitting, ask your health care provider. If you drink alcohol: Limit how much you have to: 0-1 drink a day for women who are not pregnant. 0-2 drinks a day for men. Know how much alcohol is in a drink. In the [place] one drink equals one 12  oz bottle of beer (355 mL), one 5 oz glass of wine (148 mL), or one 1 oz glass of hard liquor (44 mL). Eating and drinking Keep a food diary. This may help you identify and avoid any foods that trigger your  symptoms. Drink enough fluid to keep your urine pale yellow. Follow a well-balanced diet as told by your health care provider. This may include: Avoiding carbonated drinks. Avoiding popcorn, vegetable skins, nuts, and other high-fiber foods. Avoiding high-fat foods. Eating smaller meals, but more often. Limiting sugary drinks. Limiting caffeine. Follow food safety recommendations as told by your health care provider. This may include making sure you: Avoid eating raw or undercooked meat, fish, or eggs. Do not eat or drink spoiled or expired foods and drinks. General instructions Wash your hands often with soap and water for at least 20 seconds. If soap and water are not available, use hand sanitizer. Stay up to date on your vaccinations, including a yearly (annual) flu shot. Ask your health care provider which vaccines you should get. Have cancer screening tests as told by your health care provider. Ulcerative colitis may place you at increased risk for colon cancer. Keep all follow-up visits. This is important. Where to find more information You can find some helpful and educational information about ulcerative colitis at the [facility] online here: [email or web address] Contact a health care provider if: Your symptoms do not improve or they get worse with treatment. You continue to lose weight. You have constant cramps or loose stools. You develop a new skin rash, skin sores, or eye problems. You have a fever or chills. Get help right away if: You have bloody diarrhea. You have severe bleeding from the rectum. You feel that your heart is racing. You have severe pain in your abdomen. Your abdomen swells (abdominal distension). Your abdomen is tender to the touch. You vomit. Summary Ulcerative colitis is long-lasting (chronic) inflammation of the large intestine (colon) and rectum. Sores (ulcers) may also form in these areas. Follow  instructions from your health care provider about medicines, lifestyle changes, and eating and drinking. Contact your health care provider if symptoms do not improve or they get worse with treatment. Get help right away if you have severe abdominal pain, abdominal swelling, or severe bleeding from the rectum. Keep all follow-up visits. This is important. This information is not intended to replace advice given to you by your health care provider. Make sure you discuss any questions you have with your health care provider. Document Revised: 04/29/2020 Document Reviewed: 04/29/2020 Elsevier Patient Education  [place].

## 2021-05-25 NOTE — Telephone Encounter (Signed)
FYI NOTES:  Primary insurance: Medicare.  Medicare will cover 80% and the patient will have remaining 20% co-insurance.  Secondary insurance ([employer]ET[employer]) no longer active.  Term date: 04/05/21.  Patient has met $233 deductible. Ref# [id number].  Left v/m with patient in regards to secondary insurance (possibly new insurance)  Will enroll patient in ENTYVIO PAP  Auth Submission: NO AUTH NEEDED Payer: MEDICARE Medication & CPT/J Code(s) submitted: Entyvio (Vedolizumab) [id number] Route of submission (phone, fax, portal): PHONE [phone]  Will update once we receive a response.

## 2021-05-26 ENCOUNTER — Encounter (HOSPITAL_COMMUNITY): Payer: Self-pay | Admitting: Emergency Medicine

## 2021-05-26 ENCOUNTER — Emergency Department (HOSPITAL_COMMUNITY): Payer: Medicare Other

## 2021-05-26 ENCOUNTER — Emergency Department (HOSPITAL_COMMUNITY)
Admission: EM | Admit: 2021-05-26 | Discharge: 2021-05-26 | Disposition: A | Discharge status: Other Acute Inpatient Hospital | Payer: Medicare Other | Attending: Emergency Medicine | Admitting: Emergency Medicine

## 2021-05-26 ENCOUNTER — Other Ambulatory Visit: Payer: Self-pay

## 2021-05-26 ENCOUNTER — Telehealth: Payer: Self-pay

## 2021-05-26 DIAGNOSIS — E785 Hyperlipidemia, unspecified: Secondary | ICD-10-CM | POA: Diagnosis not present

## 2021-05-26 DIAGNOSIS — I10 Essential (primary) hypertension: Secondary | ICD-10-CM | POA: Diagnosis not present

## 2021-05-26 DIAGNOSIS — R42 Dizziness and giddiness: Secondary | ICD-10-CM | POA: Diagnosis not present

## 2021-05-26 DIAGNOSIS — Z79899 Other long term (current) drug therapy: Secondary | ICD-10-CM | POA: Diagnosis not present

## 2021-05-26 DIAGNOSIS — Z0389 Encounter for observation for other suspected diseases and conditions ruled out: Secondary | ICD-10-CM | POA: Diagnosis not present

## 2021-05-26 DIAGNOSIS — E119 Type 2 diabetes mellitus without complications: Secondary | ICD-10-CM | POA: Insufficient documentation

## 2021-05-26 DIAGNOSIS — R9431 Abnormal electrocardiogram [ECG] [EKG]: Secondary | ICD-10-CM | POA: Diagnosis not present

## 2021-05-26 DIAGNOSIS — I700 Atherosclerosis of aorta: Secondary | ICD-10-CM | POA: Diagnosis not present

## 2021-05-26 DIAGNOSIS — K9409 Other complications of colostomy: Secondary | ICD-10-CM | POA: Insufficient documentation

## 2021-05-26 DIAGNOSIS — E875 Hyperkalemia: Secondary | ICD-10-CM | POA: Insufficient documentation

## 2021-05-26 DIAGNOSIS — R799 Abnormal finding of blood chemistry, unspecified: Secondary | ICD-10-CM | POA: Diagnosis present

## 2021-05-26 DIAGNOSIS — N184 Chronic kidney disease, stage 4 (severe): Secondary | ICD-10-CM | POA: Diagnosis not present

## 2021-05-26 DIAGNOSIS — N179 Acute kidney failure, unspecified: Secondary | ICD-10-CM

## 2021-05-26 DIAGNOSIS — T8131XA Disruption of external operation (surgical) wound, not elsewhere classified, initial encounter: Secondary | ICD-10-CM | POA: Diagnosis not present

## 2021-05-26 DIAGNOSIS — Z833 Family history of diabetes mellitus: Secondary | ICD-10-CM | POA: Diagnosis not present

## 2021-05-26 DIAGNOSIS — Z87891 Personal history of nicotine dependence: Secondary | ICD-10-CM | POA: Diagnosis not present

## 2021-05-26 DIAGNOSIS — J45909 Unspecified asthma, uncomplicated: Secondary | ICD-10-CM | POA: Insufficient documentation

## 2021-05-26 DIAGNOSIS — N200 Calculus of kidney: Secondary | ICD-10-CM | POA: Diagnosis not present

## 2021-05-26 DIAGNOSIS — S31109A Unspecified open wound of abdominal wall, unspecified quadrant without penetration into peritoneal cavity, initial encounter: Secondary | ICD-10-CM | POA: Diagnosis not present

## 2021-05-26 DIAGNOSIS — Z20822 Contact with and (suspected) exposure to covid-19: Secondary | ICD-10-CM | POA: Insufficient documentation

## 2021-05-26 DIAGNOSIS — K449 Diaphragmatic hernia without obstruction or gangrene: Secondary | ICD-10-CM | POA: Diagnosis not present

## 2021-05-26 DIAGNOSIS — Z932 Ileostomy status: Secondary | ICD-10-CM | POA: Diagnosis not present

## 2021-05-26 DIAGNOSIS — L02211 Cutaneous abscess of abdominal wall: Secondary | ICD-10-CM | POA: Diagnosis not present

## 2021-05-26 DIAGNOSIS — I129 Hypertensive chronic kidney disease with stage 1 through stage 4 chronic kidney disease, or unspecified chronic kidney disease: Secondary | ICD-10-CM | POA: Diagnosis not present

## 2021-05-26 DIAGNOSIS — T8141XA Infection following a procedure, superficial incisional surgical site, initial encounter: Secondary | ICD-10-CM | POA: Diagnosis not present

## 2021-05-26 DIAGNOSIS — K5090 Crohn's disease, unspecified, without complications: Secondary | ICD-10-CM | POA: Diagnosis not present

## 2021-05-26 DIAGNOSIS — K659 Peritonitis, unspecified: Secondary | ICD-10-CM

## 2021-05-26 DIAGNOSIS — Z9049 Acquired absence of other specified parts of digestive tract: Secondary | ICD-10-CM | POA: Diagnosis not present

## 2021-05-26 DIAGNOSIS — E876 Hypokalemia: Secondary | ICD-10-CM | POA: Diagnosis not present

## 2021-05-26 DIAGNOSIS — K9419 Other complications of enterostomy: Secondary | ICD-10-CM | POA: Diagnosis not present

## 2021-05-26 LAB — CBC WITH DIFFERENTIAL/PLATELET
Abs Immature Granulocytes: 0.04 10*3/uL (ref 0.00–0.07)
Basophils Absolute: 0 10*3/uL (ref 0.0–0.1)
Basophils Relative: 0 %
Eosinophils Absolute: 0.2 10*3/uL (ref 0.0–0.5)
Eosinophils Relative: 2 %
HCT: 29.6 % — ABNORMAL LOW (ref 36.0–46.0)
Hemoglobin: 9.4 g/dL — ABNORMAL LOW (ref 12.0–15.0)
Immature Granulocytes: 0 %
Lymphocytes Relative: 20 %
Lymphs Abs: 2 10*3/uL (ref 0.7–4.0)
MCH: 32 pg (ref 26.0–34.0)
MCHC: 31.8 g/dL (ref 30.0–36.0)
MCV: 100.7 fL — ABNORMAL HIGH (ref 80.0–100.0)
Monocytes Absolute: 0.8 10*3/uL (ref 0.1–1.0)
Monocytes Relative: 8 %
Neutro Abs: 7.2 10*3/uL (ref 1.7–7.7)
Neutrophils Relative %: 70 %
Platelets: 415 10*3/uL — ABNORMAL HIGH (ref 150–400)
RBC: 2.94 MIL/uL — ABNORMAL LOW (ref 3.87–5.11)
RDW: 15.2 % (ref 11.5–15.5)
WBC: 10.3 10*3/uL (ref 4.0–10.5)
nRBC: 0 % (ref 0.0–0.2)

## 2021-05-26 LAB — COMPREHENSIVE METABOLIC PANEL
ALT: 90 U/L — ABNORMAL HIGH (ref 0–44)
AST: 60 U/L — ABNORMAL HIGH (ref 15–41)
Albumin: 3.6 g/dL (ref 3.5–5.0)
Alkaline Phosphatase: 198 U/L — ABNORMAL HIGH (ref 38–126)
Anion gap: 15 (ref 5–15)
BUN: 66 mg/dL — ABNORMAL HIGH (ref 8–23)
CO2: 20 mmol/L — ABNORMAL LOW (ref 22–32)
Calcium: 9.9 mg/dL (ref 8.9–10.3)
Chloride: 94 mmol/L — ABNORMAL LOW (ref 98–111)
Creatinine, Ser: 4.08 mg/dL — ABNORMAL HIGH (ref 0.44–1.00)
GFR, Estimated: 11 mL/min — ABNORMAL LOW (ref >60)
Glucose, Bld: 101 mg/dL — ABNORMAL HIGH (ref 70–99)
Potassium: 5.4 mmol/L — ABNORMAL HIGH (ref 3.5–5.1)
Sodium: 129 mmol/L — ABNORMAL LOW (ref 135–145)
Total Bilirubin: 0.8 mg/dL (ref 0.3–1.2)
Total Protein: 8.7 g/dL — ABNORMAL HIGH (ref 6.5–8.1)

## 2021-05-26 LAB — BASIC METABOLIC PANEL
BUN: 71 mg/dL — ABNORMAL HIGH (ref 6–23)
CO2: 24 mEq/L (ref 19–32)
Calcium: 10.3 mg/dL (ref 8.4–10.5)
Chloride: 92 mEq/L — ABNORMAL LOW (ref 96–112)
Creatinine, Ser: 4.25 mg/dL — ABNORMAL HIGH (ref 0.40–1.20)
GFR: 10.27 mL/min — CL (ref >60.00)
Glucose, Bld: 103 mg/dL — ABNORMAL HIGH (ref 70–99)
Potassium: 5.5 mEq/L — ABNORMAL HIGH (ref 3.5–5.1)
Sodium: 130 mEq/L — ABNORMAL LOW (ref 135–145)

## 2021-05-26 LAB — URINALYSIS, MICROSCOPIC (REFLEX)

## 2021-05-26 LAB — URINALYSIS, ROUTINE W REFLEX MICROSCOPIC
Bilirubin Urine: NEGATIVE
Glucose, UA: NEGATIVE mg/dL
Ketones, ur: NEGATIVE mg/dL
Nitrite: NEGATIVE
Protein, ur: NEGATIVE mg/dL
Specific Gravity, Urine: <1.005 — ABNORMAL LOW (ref 1.005–1.030)
pH: 6 (ref 5.0–8.0)

## 2021-05-26 LAB — PROTIME-INR
INR: 1 (ref 0.8–1.2)
Prothrombin Time: 13.6 seconds (ref 11.4–15.2)

## 2021-05-26 LAB — MAGNESIUM: Magnesium: 1.5 mg/dL — ABNORMAL LOW (ref 1.7–2.4)

## 2021-05-26 LAB — LACTIC ACID, PLASMA: Lactic Acid, Venous: 1.7 mmol/L (ref 0.5–1.9)

## 2021-05-26 LAB — RESP PANEL BY RT-PCR (FLU A&B, COVID) ARPGX2
Influenza A by PCR: NEGATIVE
Influenza B by PCR: NEGATIVE
SARS Coronavirus 2 by RT PCR: NEGATIVE

## 2021-05-26 LAB — APTT: aPTT: 26 seconds (ref 24–36)

## 2021-05-26 LAB — CK: Total CK: 62 U/L (ref 38–234)

## 2021-05-26 IMAGING — DX DG CHEST 1V PORT
1 series · 1 of 1 positions shown · non-contrast
Comparison: [DATE].

CLINICAL DATA: Questionable sepsis - evaluate for abnormality

EXAM:
PORTABLE CHEST 1 VIEW

[chest ap]
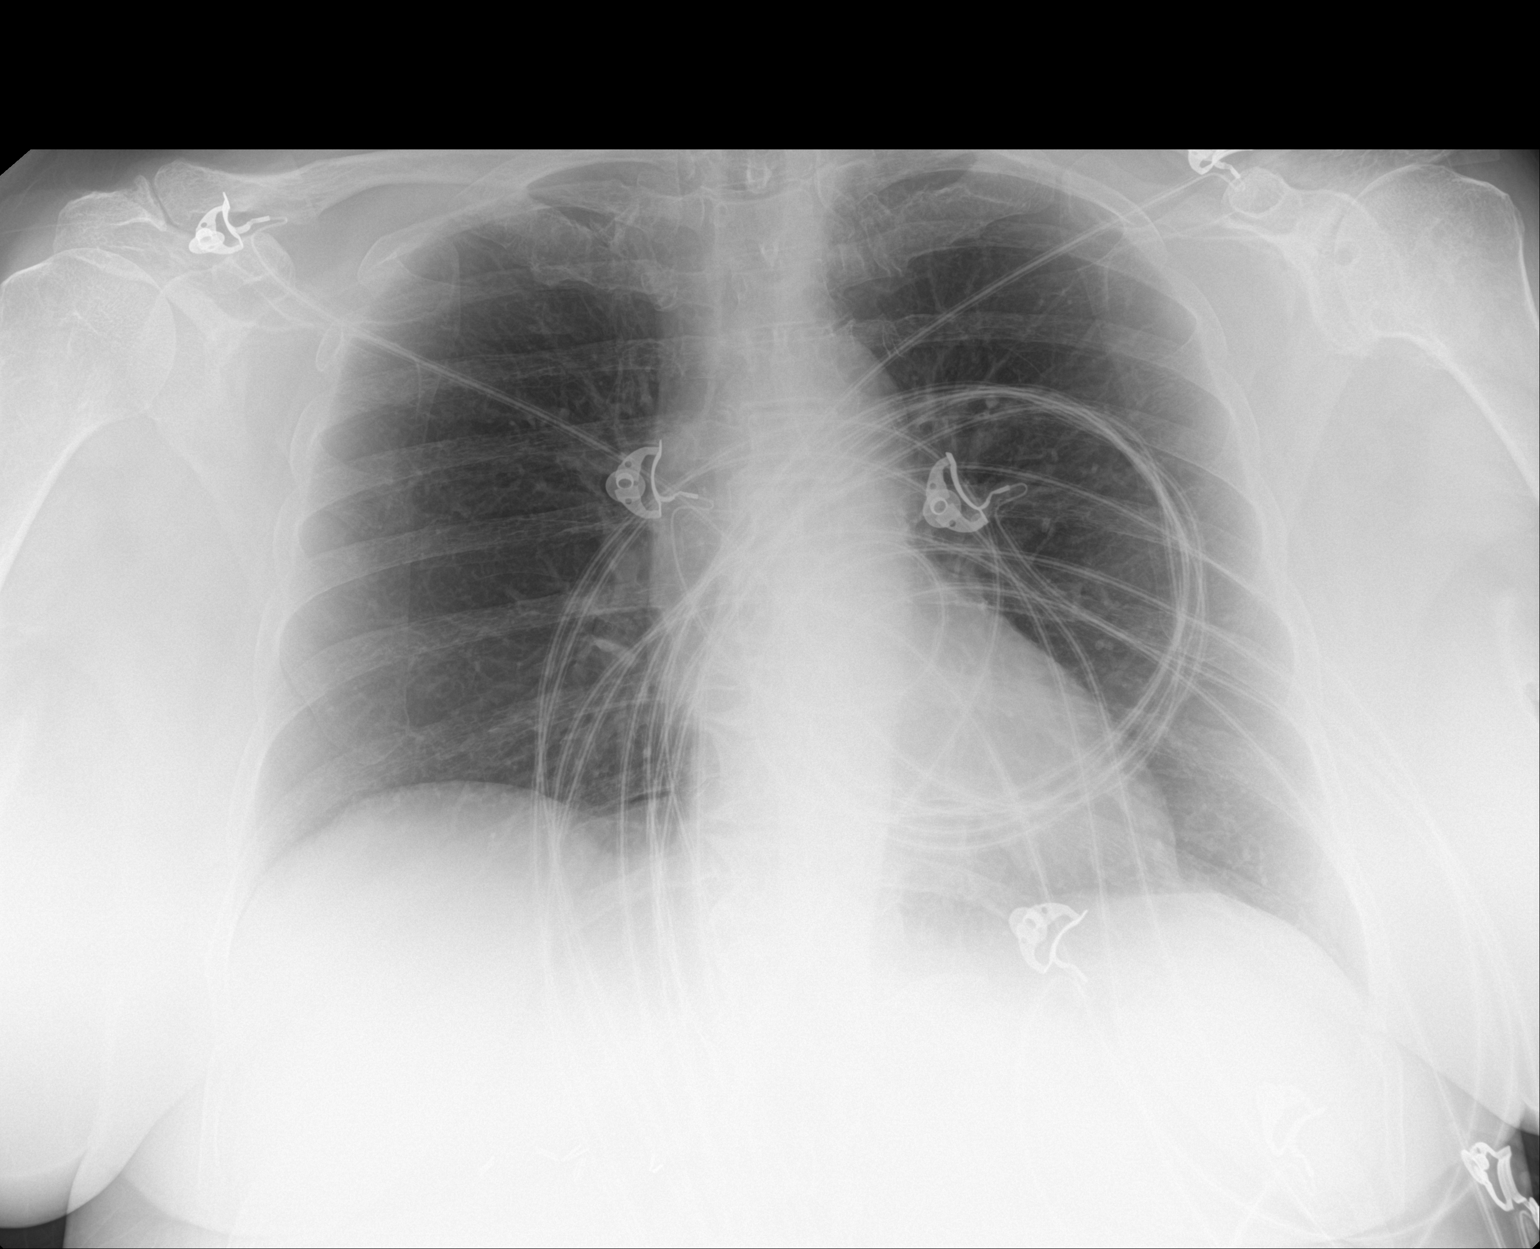

[1 of 1 positions shown; findings below may reference images not displayed]

FINDINGS: The heart size and mediastinal contours are within normal limits.
Both lungs are clear. No visible pleural effusions or pneumothorax.
No acute osseous abnormality. Cholecystectomy clips.
IMPRESSION: No evidence of acute cardiopulmonary disease.

## 2021-05-26 MED ORDER — LACTATED RINGERS IV BOLUS: 1000.0000 mL | Freq: Once | INTRAVENOUS | Status: DC

## 2021-05-26 MED ORDER — PIPERACILLIN-TAZOBACTAM 3.375 G IVPB 30 MIN
3.3750 g | Freq: Once | INTRAVENOUS | Status: AC
  Administered 2021-05-26: 3.375 g via INTRAVENOUS
  Filled 2021-05-26: qty 50

## 2021-05-26 MED ORDER — SODIUM ZIRCONIUM CYCLOSILICATE 5 G PO PACK
5.0000 g | PACK | Freq: Once | ORAL | Status: AC
  Administered 2021-05-26: 5 g via ORAL
  Filled 2021-05-26: qty 1

## 2021-05-26 MED ORDER — SODIUM CHLORIDE 0.9 % IV BOLUS
1000.0000 mL | Freq: Once | INTRAVENOUS | Status: AC
  Administered 2021-05-26: 1000 mL via INTRAVENOUS

## 2021-05-26 MED ORDER — HYDROMORPHONE HCL 1 MG/ML IJ SOLN
0.5000 mg | Freq: Once | INTRAMUSCULAR | Status: AC
Start: 2021-05-26 — End: 2021-05-26
  Administered 2021-05-26: 0.5 mg via INTRAVENOUS
  Filled 2021-05-26: qty 1

## 2021-05-26 MED ORDER — MAGNESIUM OXIDE -MG SUPPLEMENT 400 (240 MG) MG PO TABS
400.0000 mg | ORAL_TABLET | Freq: Once | ORAL | Status: AC
  Administered 2021-05-26: 400 mg via ORAL
  Filled 2021-05-26: qty 1

## 2021-05-26 NOTE — ED Provider Notes (Signed)
  Physical Exam  BP 126/71   Pulse (!) 121   Temp 98.5 F (36.9 C)   Resp 16   Ht 5' 3"  (1.6 m)   Wt 84.4 kg   SpO2 95%   BMI 32.95 kg/m   Physical Exam Nursing note reviewed.    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  Presenting with AKI and hyperkalemia. Tachycardic here, workup pending. Plan for non-contrasted CT of the abdomen due to concern for abdominal wall abscess. Needs ABX and admission.   7:03 PM Work-up reviewed to date significant for hypomagnesemia to 1.5, repleted orally, mild hyperkalemia to 5.4, Lokelma administered, AKI with an elevated BUN and creatinine to 66 and 4.08.  CT of the abdomen pelvis was performed Which revealed status post colectomy in the right lower quadrant with ileostomy, no bowel obstruction or free air.  Ill-defined fluid and air were seen throughout the midline abdominal incision similar to her prior study.  Findings may be within normal limits with postoperative changes however developing or fluid collection cannot be entirely excluded with small bowel fistula cannot be excluded either as multiple small bowel loops approximate the abdominal wall.  On exam, the patient does have some purulent drainage from this area concerning for possible small bowel fistula or abdominal wall abscess.  General surgery consulted for recommendations and hospitalist medicine consulted for admission.  General surgery recommended subspecialty consultation given her prior care at [facility] with colorectal surgery.  The patient was administered IV Zofran for antibiotic coverage and given IV Dilaudid for pain control.  Her magnesium was replenished orally and she was given a Lokelma packet for mild hyperkalemia to 5.4.    I spoke with Dr. Lenise Arena of colorectal surgery at [facility] who accepted the patient in transfer for further care and management.  The patient remained hemodynamically stable in the emergency department, mildly tachycardic in the ED. EMTALA completed prior to transfer.        Regan Lemming, MD 05/26/21 [id number]

## 2021-05-26 NOTE — ED Triage Notes (Signed)
Pt states that she had labs done yesterday and her creatinine was high. Hx of same. Sent to get fluids. Tachycardic. Hypotensive. Alert and oriented.

## 2021-05-26 NOTE — Telephone Encounter (Signed)
See result notes. 

## 2021-05-26 NOTE — ED Provider Notes (Addendum)
[place] DEPT Provider Note   CSN: [id number] Arrival date & time: 05/26/21  1307     History Chief Complaint  Patient presents with   Abnormal Lab    [name] is a 65 y.o. female.   Abnormal Lab Patient presents for abnormal labs.  She had labs drawn yesterday and was found to have AKI and hyperkalemia.  She has a history of abdominal surgeries from ulcerative colitis.  She has a ostomy in her right lower abdomen.  She reports her ostomy output has been normal.  She has abscesses along a previous surgical incision in her midline abdomen.  She was last seen for these about 2 weeks ago.  Since she was last seen, she has had a drainage from the lower abscess.  She has had decreased p.o. intake recently.  She has had decreased urine output.  She has experienced symptoms of dizziness and lightheadedness with standing and ambulation.  She has not had known fevers.  She denies any other wounds besides her abdomen.  She is not currently experiencing any pain.  She denies any nausea.  Her breathing feels normal to her.    Past Medical History:  Diagnosis Date   Abdominal pain    Hx   Allergy    Anal stenosis    Anemia    Anxiety    Arthritis    Asthma    patient does not have inhaler   Blood in stool    Hx   Blood in urine    Hx   Blood transfusion without reported diagnosis    Cataract    CKD (chronic kidney disease) stage 3, GFR 30-59 ml/min (HCC)    Crohn's colitis, other complication (HCC)    De Quervain's tenosynovitis    Depression    Difficulty urinating    Hx   Easy bruising    Esophagitis    Fainting    History - resolved - due to dehydration   Fatigue    Hx   Fibroid    Hx   Gastric polyp    GERD (gastroesophageal reflux disease)    Hearing loss    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   Hemorrhoids, external    Hemorrhoids, internal    Herpes, genital    vaginal treated 07/05/12 and pt states is resolved   History  of cervical dysplasia    History of small bowel obstruction    Hyperlipidemia    currently no meds   Hyperparathyroidism    Hypertension    Hypokalemia    Hx   IBD (inflammatory bowel disease)    initially colectomy for suspected UC, 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   Obesity    Ovarian cyst    Poor dental hygiene    Pulmonary nodule, right    right upper lobe   Rectal bleeding    Hx   Rectal pain    Hx   Renal insufficiency    CKD - stage 3   RLS (restless legs syndrome)    no meds   Tooth infection 11/2016   right low   Ulcerative colitis    Visual disturbance    wears glasses   Weakness generalized    Hx - patient denies generalized weakness   Wears dentures    upper only    Patient Active Problem List   Diagnosis Date Noted   Crohn's disease of both small and  large intestine ([place]) 05/20/2021   Acute kidney injury superimposed on chronic kidney disease ([place]) 05/14/2021   Thrombocytosis 05/14/2021   SIRS (systemic inflammatory response syndrome) ([place]) 05/14/2021   Hypochloremia 05/14/2021   Chronic pain of left knee 03/10/2021   Depression, major, single episode, severe ([place]) 12/18/2020   Hyperlipidemia associated with type 2 diabetes mellitus ([place]) 12/18/2020   Severe sepsis ([place]) 12/07/2020   Acute pyelonephritis 12/07/2020   Closed fracture of left tibial plateau 10/30/2020   Screening for osteoporosis 10/30/2020   Diarrhea with dehydration 09/21/2020   Crohn's disease of jejunum ([place]) 08/23/2020   Chronic diarrhea 54/65/0354   Metabolic acidosis 65/68/1275   ARF (acute renal failure) ([place]) 08/23/2020   Acute renal failure superimposed on stage 4 chronic kidney disease ([place]) 08/22/2020   Hyperglycemia 02/26/2020   Immunologic deficiency syndrome ([place]) 02/26/2020   Prothrombin gene mutation ([place]) 02/26/2020   Acute pain of left shoulder 06/04/2019   Pes anserine bursitis 04/12/2018   Pain of right thumb 04/12/2018   Low back pain  06/21/2017   Acute right-sided low back pain with right-sided sciatica 06/21/2017   Gout 06/21/2017   Superficial thrombophlebitis of right upper extremity 17/00/1749   Acute basilic vein thrombosis, right 02/16/2017   Symptomatic anemia 01/15/2017   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   Rectal pain 12/01/2015   IBD (inflammatory bowel disease) 10/24/2015   Anal stricture 10/24/2015   Gout of foot 10/29/2014   Ovarian cyst, right 06/12/2014   Acute on chronic renal failure ([place]) 04/02/2013   Hydrosalpinx 02/14/2013   Hyperparathyroidism ([place]) 09/18/2012   HSV (herpes simplex virus) anogenital infection 06/28/2012   CKD (chronic kidney disease), stage IV ([place]) 06/17/2012   LLQ abdominal pain 06/22/2011   RESTLESS LEGS SYNDROME 08/05/2009   PULMONARY NODULE, RIGHT UPPER LOBE 02/28/2009   TOBACCO ABUSE, HX OF 02/11/2009   PAIN IN JOINT, MULTIPLE SITES 02/04/2009   Nonspecific (abnormal) findings on radiological and other examination of body structure 12/23/2008   CT, CHEST, ABNORMAL 12/23/2008   HEMANGIOMA SIMPLEX 12/16/2008   Pain in joint, pelvic region and thigh 12/16/2008   SKIN RASH 12/16/2008   TINEA CORPORIS 04/18/2008   LATERAL EPICONDYLITIS, RIGHT 04/18/2008   Morbid obesity ([place]) 02/05/2008   ANEMIA 02/05/2008   ANAL STENOSIS 02/05/2008   Stool culture positive for Clostridioides difficile 02/05/2008   Personal history of other endocrine, metabolic, and immunity disorders 02/05/2008   Personal history of other diseases of digestive system 02/05/2008   SYNCOPE AND COLLAPSE 02/02/2008   Hyponatremia 12/15/2007   Dehydration 12/15/2007   HYPOKALEMIA 12/15/2007   ANEMIA-IRON DEFICIENCY 12/15/2007   LEUKOCYTOSIS 12/15/2007   Anxiety state 12/15/2007   Disorder resulting from impaired renal function 11/16/2007   TINNITUS NOS 05/25/2007   DE QUERVAIN'S TENOSYNOVITIS 05/25/2007   Hyperlipidemia LDL goal <100 10/23/2006   Depression 10/23/2006   Essential hypertension  10/23/2006   GERD 10/23/2006   Gastro-esophageal reflux disease without esophagitis 10/23/2006   Ulcerative colitis without complications ([place]) 44/96/7591   GASTRIC POLYP 11/16/2001   GERD with esophagitis 11/16/2001    Past Surgical History:  Procedure Laterality Date   ANAL DILATION     CERVICAL BIOPSY  W/ LOOP ELECTRODE EXCISION     CHOLECYSTECTOMY     COLONOSCOPY     [name]   fatty tumor removed from back     X 2   HEMORRHOID SURGERY     ILEOSTOMY CLOSURE     RESTORATIVE PROCTOCOLECTOMY     with insertion of ileoanal J Pouch  with loop ileostomy   SHOULDER ARTHROSCOPY WITH CAPSULORRHAPHY Left 06/14/2019   Procedure: LEFT SHOULDER ARTHRSCOPIC REPAIR OF BONY BANKART FRACTURE;  Surgeon: Tania Ade, MD;  Location: [facility];  Service: Orthopedics;  Laterality: Left;  REQUEST 4 MINUTE   SIGMOIDOSCOPY     TOTAL ABDOMINAL HYSTERECTOMY  1998   TAH/LSO   TUBAL LIGATION     UPPER GASTROINTESTINAL ENDOSCOPY     [name]     OB History     Gravida  4   Para  2   Term  2   Preterm      AB  2   Living  2      SAB  2   IAB      Ectopic      Multiple      Live Births  2           Family History  Problem Relation Age of Onset   Ulcerative colitis Father    Hypertension Father    Heart attack Father    Hypertension Mother    Heart disease Mother        s/p pci   Ulcerative colitis Daughter    Irritable bowel syndrome Other        grandchildren   Diabetes Sister    Cancer Sister        uterine   Cancer Maternal Uncle        LUNG   Colon cancer Neg Hx    Esophageal cancer Neg Hx    Stomach cancer Neg Hx    Rectal cancer Neg Hx     Social History   Tobacco Use   Smoking status: Former    Packs/day: 1.00    Years: 4.00    Pack years: 4.00    Types: Cigarettes    Quit date: 05/21/1979    Years since quitting: 42.0   Smokeless tobacco: Never  Vaping Use   Vaping Use: Never used  Substance Use Topics   Alcohol use: No    Alcohol/week: 0.0  standard drinks   Drug use: No    Home Medications Prior to Admission medications   Medication Sig Start Date End Date Taking? Authorizing Provider  acetaminophen (TYLENOL) 500 MG tablet Take 500-1,000 mg by mouth at bedtime.    [provider]  allopurinol (ZYLOPRIM) 300 MG tablet TAKE 1 TABLET BY MOUTH EVERY DAY 05/18/21   Carollee Herter, Alferd Apa, DO  Calcium Carbonate Antacid (TUMS EXTRA STRENGTH 750 PO) Take 1-2 tablets by mouth See admin instructions. Chew 1-2 tablets by mouth as needed for heartburn or reflux    [provider]  DULoxetine (CYMBALTA) 60 MG capsule Take 2 capsules (120 mg total) by mouth daily. Patient taking differently: Take 120 mg by mouth at bedtime. 11/05/20   Carollee Herter, Yvonne R, DO  ENTYVIO 300 MG injection Inject 300 mg into the vein every 28 (twenty-eight) days.    [provider]  famotidine (PEPCID) 20 MG tablet TAKE 1 TABLET BY MOUTH TWICE A DAY Patient taking differently: Take 20 mg by mouth daily as needed for heartburn or indigestion. 04/01/21   Armbruster, Carlota Raspberry, MD  folic acid (FOLVITE) 1 MG tablet Take 2 tablets (2 mg total) by mouth daily. Patient taking differently: Take 1-2 mg by mouth daily. 05/26/17   [name], MD  gabapentin (NEURONTIN) 100 MG capsule TAKE 1 CAPSULE BY MOUTH THREE TIMES A DAY Patient taking differently: Take 100 mg by mouth 3 (three)  times daily. 02/13/21   Ann Held, DO  metoprolol succinate (TOPROL-XL) 100 MG 24 hr tablet Take 50-100 mg by mouth See admin instructions. Take 100 mg by mouth in the morning and 50 mg at bedtime    [provider]  nystatin (MYCOSTATIN/NYSTOP) powder Apply 1 application topically daily as needed for rash. 05/06/21   [provider]  ondansetron (ZOFRAN) 4 MG tablet Take 1 tablet (4 mg total) by mouth every 6 (six) hours as needed for up to 20 doses for nausea. 05/16/21   Antonieta Pert, MD  oxyCODONE (OXY IR/ROXICODONE) 5 MG immediate release  tablet Take 5 mg by mouth at bedtime as needed for moderate pain. 04/27/21   [provider]  Vitamin D, Ergocalciferol, (DRISDOL) 1.25 MG (50000 UNIT) CAPS capsule Take 50,000 Units by mouth every 30 (thirty) days.    [provider]    Allergies    Sulfa antibiotics, Morphine, and Sulfonamide derivatives  Review of Systems   Review of Systems  Constitutional:  Positive for appetite change, fatigue and unexpected weight change. Negative for chills and fever.  HENT:  Negative for ear pain and sore throat.   Eyes:  Negative for pain and visual disturbance.  Respiratory:  Negative for cough, shortness of breath and wheezing.   Cardiovascular:  Negative for chest pain and palpitations.  Gastrointestinal:  Negative for abdominal pain, diarrhea, nausea and vomiting.  Genitourinary:  Positive for decreased urine volume. Negative for dysuria and hematuria.  Musculoskeletal:  Negative for arthralgias, back pain, myalgias and neck pain.  Skin:  Positive for wound. Negative for color change and rash.  Neurological:  Positive for dizziness and light-headedness. Negative for seizures, syncope, weakness and numbness.       Dizziness and lightheadedness upon standing  Psychiatric/Behavioral:  Negative for confusion and decreased concentration.   All other systems reviewed and are negative.  Physical Exam Updated Vital Signs BP 97/62 (BP Location: Left Arm)   Pulse (!) 125   Temp 97.7 F (36.5 C) (Oral)   Resp 18   Ht 5' 3"  (1.6 m)   Wt 84.4 kg   SpO2 99%   BMI 32.95 kg/m   Physical Exam Vitals and nursing note reviewed.  Constitutional:      General: She is not in acute distress.    Appearance: She is well-developed. She is not toxic-appearing or diaphoretic.  HENT:     Head: Normocephalic and atraumatic.     Right Ear: External ear normal.     Left Ear: External ear normal.     Nose: Nose normal.  Eyes:     Extraocular Movements: Extraocular movements intact.      Conjunctiva/sclera: Conjunctivae normal.  Cardiovascular:     Rate and Rhythm: Regular rhythm. Tachycardia present.     Heart sounds: No murmur heard. Pulmonary:     Effort: Pulmonary effort is normal. No respiratory distress.     Breath sounds: Normal breath sounds. No wheezing.  Chest:     Chest wall: No tenderness.  Abdominal:     Palpations: Abdomen is soft.     Tenderness: There is no abdominal tenderness.     Comments: Ostomy in right lower quadrant.  Purulent drainage from midline incisional wounds.  Musculoskeletal:        General: No swelling or tenderness. Normal range of motion.     Cervical back: Neck supple.  Skin:    General: Skin is warm and dry.  Neurological:  General: No focal deficit present.     Mental Status: She is alert and oriented to person, place, and time.     Cranial Nerves: No cranial nerve deficit.     Sensory: No sensory deficit.     Motor: No weakness.     Coordination: Coordination normal.  Psychiatric:        Mood and Affect: Mood normal.        Behavior: Behavior normal.        Thought Content: Thought content normal.        Judgment: Judgment normal.    ED Results / Procedures / Treatments   Labs (all labs ordered are listed, but only abnormal results are displayed) Labs Reviewed  COMPREHENSIVE METABOLIC PANEL  CBC WITH DIFFERENTIAL/PLATELET  CK  MAGNESIUM  URINALYSIS, ROUTINE W REFLEX MICROSCOPIC    EKG None  Radiology No results found.  Procedures Procedures   Medications Ordered in ED Medications - No data to display  ED Course  I have reviewed the triage vital signs and the nursing notes.  Pertinent labs & imaging results that were available during my care of the patient were reviewed by me and considered in my medical decision making (see chart for details).    MDM Rules/Calculators/A&P                           Patient presents for findings on her lab work yesterday that showed AKI with mild hyperkalemia.   She has had decreased p.o. intake and decreased urine output.  She reports normal ostomy output.  On exam, patient tachycardic.  EKG shows sinus rhythm.  She denies any current discomfort.  She does have some purulent drainage from some midline abdominal wounds from a prior incision.  The last time these were seen by her surgery follow-up team was 2 weeks ago.  There was some scant drainage at that time but she reports the drainage has increased.  Work-up was initiated to identify extent of AKI and hyperkalemia, in addition to possible other laboratory abnormalities.  Bolus of IV fluids was ordered.  Initial IV access was difficult to obtain.  I did obtain IV access under ultrasound guidance on her right AC.  Care of patient signed out to oncoming ED provider.  Final Clinical Impression(s) / ED Diagnoses Final diagnoses:  None    Rx / DC Orders ED Discharge Orders     None        Godfrey Pick, MD 05/26/21 1713    Godfrey Pick, MD 05/26/21 1714

## 2021-05-26 NOTE — ED Provider Notes (Signed)
Emergency Medicine Provider Triage Evaluation Note  [name] , a 65 y.o. female  was evaluated in triage.  Pt complains of abnormal labs.  She had labs done yesterday and her creatinine was high as was her potassium.  She has a history of the same.  She is tachycardic and borderline hypotensive here.  She is alert and oriented.   Review of Systems  Positive: tachycardic Negative: syncope  Physical Exam  BP 97/62 (BP Location: Left Arm)   Pulse (!) 125   Temp 97.7 F (36.5 C) (Oral)   Resp 18   Ht 5' 3"  (1.6 m)   Wt 84.4 kg   SpO2 99%   BMI 32.95 kg/m  Gen:   Awake, no distress   Resp:  Normal effort  MSK:   Moves extremities without difficulty  Other:  Speech is not slurred.  She is tachycardic  Medical Decision Making  Medically screening exam initiated at 2:07 PM.  Appropriate orders placed.  [name] was informed that the remainder of the evaluation will be completed by another provider, this initial triage assessment does not replace that evaluation, and the importance of remaining in the ED until their evaluation is complete.  1411: charge RN aware patient is echocardiogram of borderline hypotensive, with AKI and hyperkalemia on labs yesterday.       Lorin Glass, PA-C 05/26/21 1411    Godfrey Pick, MD 05/26/21 2322

## 2021-05-26 NOTE — Telephone Encounter (Signed)
Thanks [employer]. I was thinking she may need just an appointment for IV fluids itself, not with the Entyvio. Can the infusion center do an appointment for just IV fluids once per week, if not more often? If so I will place the orders. Thanks

## 2021-05-26 NOTE — Telephone Encounter (Signed)
Spoke with patient in regards to information. Pt states that her nephrologist Dr. Marval Regal has told her to go to the ED for fluids now because she gets so dehydrated. Pt states that the wound nurse was there this morning but could not do anything without orders from the surgical office. She states that the nurse will be back next week. Pt is wanting to know if you can put in orders for her to have fluids more often? Not just when she goes for Entyvio? I spoke with the [place] infusion center, you will need to put orders in epic and specify if you would like fluids prior to [facility] or after. Thanks

## 2021-05-26 NOTE — ED Notes (Signed)
Imaging sent to [facility]

## 2021-05-26 NOTE — Telephone Encounter (Signed)
-----   Message from Yetta Flock, MD sent at 05/25/2021 12:57 PM EDT ----- Herbert Seta can you please touch base with this patient and let her know the following: - I have contacted Dr. Drue Flirt directly but have not heard back - She should contact the surgical office and her wound care nurse to coordinate follow up - Can you touch base with the [facility] infusion center to see if they give IV fluids? If so I would like to give her 1 L normal saline if possible. Thanks   ----- Message ----- From: Ann Held, DO Sent: 05/25/2021  11:47 AM EDT To: Yetta Flock, MD  Good morning--- I saw ms Romulus today.   Her wound is still not healing and she mentioned she were going to talk or send message to the surgeon?  I wondered if you heard from her    the pt was also wondering if you could arrange for periodic ivf since she has been in the er so much---  she said she is going to infusion center soon for entyvio.    Thanks   [employer]

## 2021-05-26 NOTE — ED Triage Notes (Signed)
Called x 1 for triage

## 2021-05-26 NOTE — ED Notes (Signed)
Report given to Vashon at [facility]

## 2021-05-26 NOTE — ED Notes (Signed)
Patient is being picked up and taken to another hospital

## 2021-05-26 NOTE — ED Notes (Signed)
Report given to [facility] transport team

## 2021-05-26 NOTE — ED Notes (Signed)
Alexia RN ( placement )  contact # [phone] left message: Patient will be transported by [facility] they are sending transportation [address] ,  Room 930 RN to call report to [phone] or Charge # [phone]

## 2021-05-26 NOTE — ED Notes (Signed)
Dressing applied to surgical wound site. New ostomy appliance applied.

## 2021-05-26 NOTE — Telephone Encounter (Signed)
CRITICAL VALUE STICKER  CRITICAL VALUE: GFR 10.27  RECEIVER (on-site recipient of call): Manuela Schwartz, RMA  DATE & TIME NOTIFIED: 05/26/2021 at [place] (representative from lab): Hope Scales  MD NOTIFIED: Roma Schanz, DO  TIME OF NOTIFICATION: 9:17 am  RESPONSE:

## 2021-05-27 ENCOUNTER — Other Ambulatory Visit: Payer: Self-pay | Admitting: Gastroenterology

## 2021-05-27 DIAGNOSIS — T8131XA Disruption of external operation (surgical) wound, not elsewhere classified, initial encounter: Secondary | ICD-10-CM | POA: Insufficient documentation

## 2021-05-27 NOTE — Telephone Encounter (Signed)
Great. Let's get her schedule for 1 L NS once per week. If [name] feels this may not be enough she needs to let us know. Has she been able to see Dr. Drue Flirt yet? I never heard back from her. Thanks

## 2021-05-27 NOTE — Telephone Encounter (Signed)
Spoke with patient, she has been advised of recommendations. She is at the [facility] hospital for abdominal infection. Pt states that she was told that Dr. Drue Flirt should be by today but she has not seen her. Pt had no concerns at the end of the call.

## 2021-05-27 NOTE — Telephone Encounter (Signed)
Dr. Havery Moros, I spoke with Mickel Baas at the [facility] infusion center and she states that they can schedule patient just for IV fluids, just specify frequency in the order. Thanks!

## 2021-05-28 LAB — WOUND CULTURE
MICRO NUMBER:: 12391426
SPECIMEN QUALITY:: ADEQUATE

## 2021-05-29 NOTE — Telephone Encounter (Signed)
Spoke with patient, she states that she called to let us know that Dr. Drue Flirt will be reaching out to Dr. Havery Moros on Monday to discuss her case further and figure out what the next steps will be. Advised that we will let her know if Dr. Havery Moros has any recommendations when he returns. Pt verbalized understanding and had no concerns at the end of the call.

## 2021-05-29 NOTE — Telephone Encounter (Addendum)
Patient is calling to follow up on what her next treatment will be. Said all they have done is give her antibiotics and she is thinking she might seek a second opinion requesting a call back. [facility] hospital phone from her room is [phone]

## 2021-05-31 LAB — CULTURE, BLOOD (ROUTINE X 2)
Culture: NO GROWTH
Culture: NO GROWTH
Special Requests: ADEQUATE
Special Requests: ADEQUATE

## 2021-06-01 ENCOUNTER — Other Ambulatory Visit: Payer: Self-pay

## 2021-06-01 DIAGNOSIS — N184 Chronic kidney disease, stage 4 (severe): Secondary | ICD-10-CM

## 2021-06-01 DIAGNOSIS — K50818 Crohn's disease of both small and large intestine with other complication: Secondary | ICD-10-CM

## 2021-06-01 NOTE — Telephone Encounter (Signed)
Orders for 1L NS weekly has been entered. Jonelle Sidle, RN at [facility] infusion center confirmed that he can see the orders.

## 2021-06-02 ENCOUNTER — Telehealth: Payer: Self-pay

## 2021-06-02 NOTE — Telephone Encounter (Signed)
Transition Care Management Unsuccessful Follow-up Telephone Call  Date of discharge and from where:  05/31/2021 / [facility]  Attempts:  1st Attempt  Reason for unsuccessful TCM follow-up call:  Left voice message  Quinn Plowman RN,BSN,CCM RN Case Manager [place] [phone]

## 2021-06-02 NOTE — Telephone Encounter (Signed)
Thanks [employer]. I have not heard back from Dr. Drue Flirt yet. She has my contact information. I can see she was admitted again to [facility] for drainage / suspected infection at her incision. I suspect she will need close monitoring of her kidney function at this point which she has had with her Nephrologist but I would like to speak with Dr. Drue Flirt if she is free to speak. Unclear at time of discharge how often they thought Ms. Rezabek may need fluids.

## 2021-06-02 NOTE — Addendum Note (Signed)
Addended by: Quinn Plowman E on: 06/02/2021 05:21 PM   Modules accepted: Orders, Level of Service, SmartSet

## 2021-06-02 NOTE — Telephone Encounter (Signed)
Transition Care Management Unsuccessful Follow-up Telephone Call  Date of discharge and from where:  05/31/2021 / [facility]  Attempts:  1st Attempt  Reason for unsuccessful TCM follow-up call:  Left voice message  Quinn Plowman RN,BSN,CCM RN Case Manager [place]  [phone]

## 2021-06-02 NOTE — Telephone Encounter (Signed)
This encounter was created in error - please disregard.

## 2021-06-03 ENCOUNTER — Telehealth: Payer: Self-pay

## 2021-06-03 NOTE — Telephone Encounter (Signed)
Spoke with Gabriel Cirri on the Physician access line with [facility]. She has sent a message directly to Dr. Drue Flirt in regards to patient. I have provided her with Dr. Doyne Keel contact number as well as the office number.  P: [phone] - office number

## 2021-06-03 NOTE — Telephone Encounter (Signed)
Transition Care Management Unsuccessful Follow-up Telephone Call  Date of discharge and from where:  05/31/21 / [place]  Attempts:  2nd Attempt  Reason for unsuccessful TCM follow-up call:  Left voice message  Quinn Plowman RN,BSN,CCM RN Case Manager [facility] Care Management [phone]

## 2021-06-03 NOTE — Telephone Encounter (Signed)
Thanks for the follow-up.  Dr. Drue Flirt called me today.  She had changed her cell phone number so she did not get my prior messages.  She updated me on her condition.  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  She does not think this is related to uncontrolled Crohn's disease.  The patient is going to have the fistula taken down operatively, at the same time she will have her J-pouch removed from her rectum.  This will be a big surgery and the patient needs a better nutritional state before she pursues this.  She plans on placing the patient on TPN at this time which I think is reasonable.  She will go ahead and order that.  She thinks in this late we can stop the IV fluid infusions that were ordered so can you please stop that and let Mrs. Brion know.  She also feels that we can probably hold off on the Entyvio right now while she is healing, we may resume this after her pouch removal surgery, we will see how her course plays out, there was no significant small bowel inflammation during her ileostomy.  Can you please let Mrs. Denes know that I spoke with Dr. Drue Flirt and that I am in agreement with the plan as she outlined, can you help cancel her Entyvio infusions at this time. Thanks

## 2021-06-04 ENCOUNTER — Other Ambulatory Visit: Payer: Self-pay

## 2021-06-04 ENCOUNTER — Ambulatory Visit (INDEPENDENT_AMBULATORY_CARE_PROVIDER_SITE_OTHER): Payer: Medicare Other

## 2021-06-04 VITALS — BP 135/84 | HR 97 | Temp 98.4°F | Resp 16 | Ht 63.0 in | Wt 184.2 lb

## 2021-06-04 DIAGNOSIS — N184 Chronic kidney disease, stage 4 (severe): Secondary | ICD-10-CM

## 2021-06-04 DIAGNOSIS — K50818 Crohn's disease of both small and large intestine with other complication: Secondary | ICD-10-CM

## 2021-06-04 DIAGNOSIS — K50819 Crohn's disease of both small and large intestine with unspecified complications: Secondary | ICD-10-CM

## 2021-06-04 MED ORDER — ANTICOAGULANT SODIUM CITRATE 4% (200MG/5ML) IV SOLN
5.0000 mL | Freq: Once | Status: DC | PRN
  Filled 2021-06-04: qty 5

## 2021-06-04 MED ORDER — HEPARIN SOD (PORK) LOCK FLUSH 100 UNIT/ML IV SOLN: 250.0000 [IU] | Freq: Once | INTRAVENOUS | Status: DC | PRN

## 2021-06-04 MED ORDER — ALBUTEROL SULFATE HFA 108 (90 BASE) MCG/ACT IN AERS: 2.0000 | INHALATION_SPRAY | Freq: Once | RESPIRATORY_TRACT | Status: DC | PRN

## 2021-06-04 MED ORDER — METHYLPREDNISOLONE SODIUM SUCC 125 MG IJ SOLR: 125.0000 mg | Freq: Once | INTRAMUSCULAR | Status: DC | PRN

## 2021-06-04 MED ORDER — SODIUM CHLORIDE 0.9% FLUSH: 3.0000 mL | Freq: Once | INTRAVENOUS | Status: DC | PRN

## 2021-06-04 MED ORDER — SODIUM CHLORIDE 0.9 % IV SOLN: INTRAVENOUS | Status: DC

## 2021-06-04 MED ORDER — SODIUM CHLORIDE 0.9 % IV SOLN: Freq: Once | INTRAVENOUS | Status: DC | PRN

## 2021-06-04 MED ORDER — ALTEPLASE 2 MG IJ SOLR: 2.0000 mg | Freq: Once | INTRAMUSCULAR | Status: DC | PRN

## 2021-06-04 MED ORDER — VEDOLIZUMAB 300 MG IV SOLR
300.0000 mg | Freq: Once | INTRAVENOUS | Status: DC
  Filled 2021-06-04: qty 5

## 2021-06-04 MED ORDER — FAMOTIDINE IN NACL 20-0.9 MG/50ML-% IV SOLN: 20.0000 mg | Freq: Once | INTRAVENOUS | Status: DC | PRN

## 2021-06-04 MED ORDER — SODIUM CHLORIDE 0.9% FLUSH: 10.0000 mL | Freq: Once | INTRAVENOUS | Status: DC | PRN

## 2021-06-04 MED ORDER — HEPARIN SOD (PORK) LOCK FLUSH 100 UNIT/ML IV SOLN: 500.0000 [IU] | Freq: Once | INTRAVENOUS | Status: DC | PRN

## 2021-06-04 MED ORDER — DIPHENHYDRAMINE HCL 50 MG/ML IJ SOLN: 50.0000 mg | Freq: Once | INTRAMUSCULAR | Status: DC | PRN

## 2021-06-04 MED ORDER — EPINEPHRINE 0.3 MG/0.3ML IJ SOAJ: 0.3000 mg | Freq: Once | INTRAMUSCULAR | Status: DC | PRN

## 2021-06-04 NOTE — Progress Notes (Signed)
Diagnosis: Crohn's Disease  Provider:  Marshell Garfinkel, MD  Procedure: Infusion  IV Type: Peripheral, IV Location: R Forearm  0.9% Sodium Chloride, Dose: 1084m  Infusion Start Time: 08.49  Infusion Stop Time: 11.00  Post Infusion IV Care: Peripheral IV Discontinued  Discharge: Condition: Good, Destination: Home . AVS provided to patient.   Performed by:  SArnoldo Morale RN

## 2021-06-04 NOTE — Telephone Encounter (Signed)
Noted, thank you

## 2021-06-04 NOTE — Telephone Encounter (Signed)
I called the patient over lunchtime earlier today and discussed her concerns.  She has had a very difficult time after her first operation.  I outlined the plan as I discussed with Dr. Drue Flirt yesterday.  Discussed recommendations for TPN at this time, we will hold the [facility], and when she has improved from a nutritional standpoint Dr. Drue Flirt will proceed with surgery to repair the fistula and remove the pouch in the upcoming months.  Patient had several questions about this which I answered.  She is agreeable to the TPN.  She got IV fluids today and scheduled to receive IV fluids next week if she has not started TPN by then.  She agrees to hold off on Entyvio for now.  She inquired about a second opinion by another colorectal surgeon.  I relayed to her that I think Dr. Geronimo Boot plan is a good one and she will take good care of her, I think a second opinion will delay her care if she chooses to go to another center however I offered that to her if she wishes strongly to do that or seek another opinion here in [place].  She states she will stick with Dr. Drue Flirt for now after our conversation.  I contacted Dr. Drue Flirt after our conversation, her office will help coordinate TPN for the patient, and I asked Dr. Drue Flirt to call her to answer her specific questions about her surgery etc.  She agreed to do so.  [facility], the patient received IV fluids today, we will plan on receiving it next week as well unless she starts TPN in the interim, hopefully they are able to start TPN pretty soon.

## 2021-06-04 NOTE — Telephone Encounter (Signed)
Spoke with patient in regards to information below. Pt states that she has about 1.5 days left of oral antibiotic treatment. Pt is very concerned about continuing care with Dr. Drue Flirt, she reports that none of the information has been discussed with her. Pt reports that her next appt with Dr. Drue Flirt is not until 100/25/22. Pt feels like she should see her sooner than that. Pt is worried about the next surgery since she developed an infection this time. Pt states that she trusts your judgement but not Dr. Geronimo Boot right now. Pt states that she was thinking about going elsewhere like [facility] or [facility]. Please advise, thanks.

## 2021-06-05 ENCOUNTER — Telehealth: Payer: Self-pay | Admitting: Gastroenterology

## 2021-06-05 NOTE — Telephone Encounter (Signed)
[name] got it thanks. I think if she starts TPN will not need IV fluid infusions as well. Dr. Drue Flirt will be managing the TPN. Thanks

## 2021-06-05 NOTE — Telephone Encounter (Signed)
Pt called to inform Dr. Havery Moros that she has an appt at the hospital next Monday for intravenous feeding.

## 2021-06-06 DIAGNOSIS — T8131XD Disruption of external operation (surgical) wound, not elsewhere classified, subsequent encounter: Secondary | ICD-10-CM | POA: Diagnosis not present

## 2021-06-06 DIAGNOSIS — E860 Dehydration: Secondary | ICD-10-CM | POA: Diagnosis not present

## 2021-06-06 DIAGNOSIS — Z432 Encounter for attention to ileostomy: Secondary | ICD-10-CM | POA: Diagnosis not present

## 2021-06-06 DIAGNOSIS — D631 Anemia in chronic kidney disease: Secondary | ICD-10-CM | POA: Diagnosis not present

## 2021-06-06 DIAGNOSIS — G2581 Restless legs syndrome: Secondary | ICD-10-CM | POA: Diagnosis not present

## 2021-06-06 DIAGNOSIS — F411 Generalized anxiety disorder: Secondary | ICD-10-CM | POA: Diagnosis not present

## 2021-06-06 DIAGNOSIS — T8141XD Infection following a procedure, superficial incisional surgical site, subsequent encounter: Secondary | ICD-10-CM | POA: Diagnosis not present

## 2021-06-06 DIAGNOSIS — F322 Major depressive disorder, single episode, severe without psychotic features: Secondary | ICD-10-CM | POA: Diagnosis not present

## 2021-06-06 DIAGNOSIS — Z452 Encounter for adjustment and management of vascular access device: Secondary | ICD-10-CM | POA: Diagnosis not present

## 2021-06-06 DIAGNOSIS — R6510 Systemic inflammatory response syndrome (SIRS) of non-infectious origin without acute organ dysfunction: Secondary | ICD-10-CM | POA: Diagnosis not present

## 2021-06-06 DIAGNOSIS — K5090 Crohn's disease, unspecified, without complications: Secondary | ICD-10-CM | POA: Diagnosis not present

## 2021-06-06 DIAGNOSIS — D509 Iron deficiency anemia, unspecified: Secondary | ICD-10-CM | POA: Diagnosis not present

## 2021-06-06 DIAGNOSIS — Z90710 Acquired absence of both cervix and uterus: Secondary | ICD-10-CM | POA: Diagnosis not present

## 2021-06-06 DIAGNOSIS — K219 Gastro-esophageal reflux disease without esophagitis: Secondary | ICD-10-CM | POA: Diagnosis not present

## 2021-06-06 DIAGNOSIS — N184 Chronic kidney disease, stage 4 (severe): Secondary | ICD-10-CM | POA: Diagnosis not present

## 2021-06-06 DIAGNOSIS — Z9181 History of falling: Secondary | ICD-10-CM | POA: Diagnosis not present

## 2021-06-06 DIAGNOSIS — Z87891 Personal history of nicotine dependence: Secondary | ICD-10-CM | POA: Diagnosis not present

## 2021-06-06 DIAGNOSIS — D75839 Thrombocytosis, unspecified: Secondary | ICD-10-CM | POA: Diagnosis not present

## 2021-06-06 DIAGNOSIS — K91858 Other complications of intestinal pouch: Secondary | ICD-10-CM | POA: Diagnosis not present

## 2021-06-06 DIAGNOSIS — I129 Hypertensive chronic kidney disease with stage 1 through stage 4 chronic kidney disease, or unspecified chronic kidney disease: Secondary | ICD-10-CM | POA: Diagnosis not present

## 2021-06-06 DIAGNOSIS — E785 Hyperlipidemia, unspecified: Secondary | ICD-10-CM | POA: Diagnosis not present

## 2021-06-06 DIAGNOSIS — H269 Unspecified cataract: Secondary | ICD-10-CM | POA: Diagnosis not present

## 2021-06-08 DIAGNOSIS — Z885 Allergy status to narcotic agent status: Secondary | ICD-10-CM | POA: Diagnosis not present

## 2021-06-08 DIAGNOSIS — K50119 Crohn's disease of large intestine with unspecified complications: Secondary | ICD-10-CM | POA: Diagnosis not present

## 2021-06-08 DIAGNOSIS — Z833 Family history of diabetes mellitus: Secondary | ICD-10-CM | POA: Diagnosis not present

## 2021-06-08 DIAGNOSIS — T8183XA Persistent postprocedural fistula, initial encounter: Secondary | ICD-10-CM | POA: Diagnosis not present

## 2021-06-08 DIAGNOSIS — E46 Unspecified protein-calorie malnutrition: Secondary | ICD-10-CM | POA: Diagnosis not present

## 2021-06-08 DIAGNOSIS — I10 Essential (primary) hypertension: Secondary | ICD-10-CM | POA: Diagnosis not present

## 2021-06-08 DIAGNOSIS — Z882 Allergy status to sulfonamides status: Secondary | ICD-10-CM | POA: Diagnosis not present

## 2021-06-08 DIAGNOSIS — K5010 Crohn's disease of large intestine without complications: Secondary | ICD-10-CM | POA: Diagnosis not present

## 2021-06-08 DIAGNOSIS — R627 Adult failure to thrive: Secondary | ICD-10-CM | POA: Diagnosis not present

## 2021-06-08 DIAGNOSIS — K50913 Crohn's disease, unspecified, with fistula: Secondary | ICD-10-CM | POA: Diagnosis not present

## 2021-06-08 NOTE — Telephone Encounter (Signed)
Spoke with patient and she is aware. Pt had no concerns at the end of the call.

## 2021-06-09 NOTE — Telephone Encounter (Signed)
(  Fyi note) Left v/m in regards to secondary insurance. Awaiting call-back.

## 2021-06-11 ENCOUNTER — Ambulatory Visit: Payer: Medicare Other

## 2021-06-11 NOTE — Progress Notes (Deleted)
Patient ID: [name], female   DOB: 07-08-1956, 65 y.o.   MRN: [id number]     [name] was called 3 times this morning regarding her 8:30am appt.   Left message on Voice mail @  9:00am,   9:30am   and 10:00am

## 2021-06-11 NOTE — Progress Notes (Deleted)
Ms. Menees was called  3 times this morning to see if she was coming for appointment.  Left voice mails each time:  09:00 am,    09:30 am      and  10:00 am  Mervyn Skeeters, RN

## 2021-06-11 NOTE — Progress Notes (Deleted)
Patient ID: [name], female   DOB: Jul 20, 1956, 65 y.o.   MRN: [id number]     Ms Mitcheltree was called 3 times this morning regarding her 8:30am appt.   Left message on Voice mail @  9:00am,   9:30am   and 10:00am

## 2021-06-12 ENCOUNTER — Telehealth: Payer: Self-pay | Admitting: Gastroenterology

## 2021-06-12 NOTE — Telephone Encounter (Signed)
Spoke with patient, she would like referral to Dr. Dema Severin at [facility] for 2nd opinion. Patient states that they will not move forward with surgery because they want her to be healthier and stronger before which could be another month. Advised that I will fax referral today and [facility] will contact her directly to set up an appt. Pt verbalized understanding and had no concerns at the end of the call.

## 2021-06-12 NOTE — Telephone Encounter (Signed)
Urgent referral, records, demographic and insurance information faxed to [facility].

## 2021-06-12 NOTE — Telephone Encounter (Signed)
Inbound call from patient requesting a referral to Dr. Dema Severin for a second opinion.

## 2021-06-14 NOTE — Telephone Encounter (Signed)
Okay that's fine if she wants another opinion with Dr. Dema Severin that is fine however I don't want her to stop care with Dr. Drue Flirt in the interim - I think her plan is a good one. She just started TPN and needs more time with this prior to having another operation, Dr. Drue Flirt has been taking care of the TPN

## 2021-06-15 ENCOUNTER — Telehealth: Payer: Self-pay

## 2021-06-15 NOTE — Telephone Encounter (Signed)
Thanks for the update [place]. Really tough situation. If her insurance won't cover TPN this will make it difficult for her nutritional status to get to a point where she will need surgery. I do think that her plan was a good one but understand that if she wants a second opinion that is okay. Dr. Dema Severin needs to review her case and I can reach out to him to see if her case is something he would be comfortable with, or if he thinks she requires tertiary level care. If she is feeling dehydrated, especially if not on TPN at this time, we should resume weekly IVF, 1 L NS per week unless her nephrologist thinks she needs it more frequently. Can you help order that again at the infusion center? Thanks, she should contact [facility] but I will also reach out to Dr. Dema Severin in the interim.

## 2021-06-15 NOTE — Telephone Encounter (Signed)
Spoke with patient in regards to information. Spoke with patient at length, she is still not happy with care at [facility]. Pt states that she is no longer receiving TPN. Insurance did not approve central line feeding at home, pt left hospital over the weekend. The central line was removed prior to her leaving. Pt was offered NG tube feeding and was OK with that but insurance wouldn't cover that either. Pt states that she is not on any antibiotics right now. Pt states that she just wants to proceed with surgery but Dr. Drue Flirt told her that she could not. Pt states that she is going to get really sick and dehydrated and will then go to [facility] for care. She states that when she comes to [facility] they send her back to [facility]. Pt states that they are worried she wont make it through the surgery if she does it now but patient is not convinced. She states that she will be fine and would rather proceed with surgery than sit around with infection in her stomach that is not being treated. Advised that the next step would be evaluation with Dr. Dema Severin. Pt is going to call [facility] today. Pt also wants you to review her records from [facility] in Care everywhere.

## 2021-06-15 NOTE — Telephone Encounter (Signed)
Transition Care Management Unsuccessful Follow-up Telephone Call  Date of discharge and from where:  06/13/21 from [place]  Attempts:  1st Attempt  Reason for unsuccessful TCM follow-up call:  Left voice message  Thea Silversmith, RN, MSN, BSN, [place] Management Coordinator [phone]

## 2021-06-16 ENCOUNTER — Telehealth: Payer: Self-pay

## 2021-06-16 ENCOUNTER — Other Ambulatory Visit: Payer: Self-pay

## 2021-06-16 DIAGNOSIS — N184 Chronic kidney disease, stage 4 (severe): Secondary | ICD-10-CM

## 2021-06-16 DIAGNOSIS — K50818 Crohn's disease of both small and large intestine with other complication: Secondary | ICD-10-CM

## 2021-06-16 NOTE — Telephone Encounter (Signed)
Transition Care Management Unsuccessful Follow-up Telephone Call  Date of discharge and from where:  06/11/2021   [facility] Attempts:  2nd Attempt  Reason for unsuccessful TCM follow-up call:  No answer/busy  Tomasa Rand, RN, BSN, CEN [place] Coordinator [phone]

## 2021-06-16 NOTE — Telephone Encounter (Signed)
Spoke with patient in regards to recommendations. Pt is aware that we will re-initiate 1 L of NS weekly for patient. Pt states that she left a message for [facility] to call her back yesterday. She states that she will reach out to [facility] again today. Pt is aware that Dr. Havery Moros is going to contact Dr. Dema Severin as well. Pt verbalized understanding and had no concerns at the end of the call.  Spoke with [name] at the [facility], she has given [name] the patient's information. She is aware that I have placed new orders for 1 L of NS weekly.

## 2021-06-18 ENCOUNTER — Ambulatory Visit (INDEPENDENT_AMBULATORY_CARE_PROVIDER_SITE_OTHER): Payer: Medicare Other

## 2021-06-18 ENCOUNTER — Other Ambulatory Visit: Payer: Self-pay

## 2021-06-18 DIAGNOSIS — K50818 Crohn's disease of both small and large intestine with other complication: Secondary | ICD-10-CM | POA: Diagnosis not present

## 2021-06-18 DIAGNOSIS — N184 Chronic kidney disease, stage 4 (severe): Secondary | ICD-10-CM | POA: Diagnosis not present

## 2021-06-18 MED ORDER — SODIUM CHLORIDE 0.9 % IV SOLN: INTRAVENOUS | Status: DC

## 2021-06-18 NOTE — Progress Notes (Signed)
Diagnosis: Fluid Replacement  Provider:  Marshell Garfinkel, MD  Procedure: Infusion  IV Type: Peripheral, IV Location: L Hand  Normal Saline, Dose: 1025m  Infusion Start Time: 05945 Infusion Stop Time: 1200  Post Infusion IV Care: Peripheral IV Discontinued  Discharge: Condition: Good, Destination: Home . AVS provided to patient.   Performed by:  SCharlie Pitter RN

## 2021-06-22 ENCOUNTER — Telehealth: Payer: Self-pay | Admitting: Gastroenterology

## 2021-06-22 NOTE — Telephone Encounter (Signed)
[place] can you please let the patient know I spoke with Dr. Dema Severin of [facility] about her case / second opinion. Given her ongoing issues, he has reviewed her case, he thinks it is best that she have care at a tertiary care facility and would not recommend that she try to keep her surgical care in [place], her level if care exceeds what they can provide for her here locally. She will have access to more resources at tertiary care facility. Her options are to stay with Dr. Drue Flirt at [facility], or seek another opinion at [place], [place], or [place]. Up to her. I understand her frustrations but I think would be best to stay with Dr. Drue Flirt, I think her care will be delayed if she goes elsewhere but ultimately up to her. Can you please ler her know? Thanks

## 2021-06-22 NOTE — Telephone Encounter (Signed)
Spoke with patient and discussed Dr. Doyne Keel recommendations. Pt states that she woke up and felt weak this morning. She states that as she went to get two boiled eggs she began gagging and trying to throw up. Pt reports that her back if full of clear/brown liquid. Pt is scheduled to get fluids later this week but is going to call and see if she can move her appt to later today. Pt states that she doesn't feel like Dr. Drue Flirt wants to do anything. Advised that her nutrition has to be adequate prior to proceeding with a surgery, pt states that she understands that but the last time she had surgery while she was in this state. Pt states that she will call Dr. Geronimo Boot office and see what they want her to do.

## 2021-06-23 ENCOUNTER — Other Ambulatory Visit: Payer: Self-pay

## 2021-06-23 ENCOUNTER — Ambulatory Visit (INDEPENDENT_AMBULATORY_CARE_PROVIDER_SITE_OTHER): Payer: Medicare Other | Admitting: *Deleted

## 2021-06-23 DIAGNOSIS — K50818 Crohn's disease of both small and large intestine with other complication: Secondary | ICD-10-CM | POA: Diagnosis not present

## 2021-06-23 MED ORDER — SODIUM CHLORIDE 0.9 % IV BOLUS
1000.0000 mL | INTRAVENOUS | Status: DC
  Administered 2021-06-23: 1000 mL via INTRAVENOUS
  Filled 2021-06-23: qty 1000

## 2021-06-23 NOTE — Progress Notes (Signed)
Diagnosis: Hydration Crohn's disease  Provider:  Marshell Garfinkel, MD  Procedure: Infusion  IV Type: Peripheral, IV Location: L Forearm  Normal Saline, Dose: 1000 ml  Infusion Start Time: 0930 am  Infusion Stop Time: 1133 am  Post Infusion IV Care: Observation period completed and Peripheral IV Discontinued  Discharge: Condition: Good, Destination: Home . AVS provided to patient.   Performed by:  Oren Beckmann, RN

## 2021-06-24 ENCOUNTER — Emergency Department (HOSPITAL_COMMUNITY)
Admission: EM | Admit: 2021-06-24 | Discharge: 2021-06-24 | Disposition: A | Discharge status: Home / Self Care | Payer: Medicare Other | Attending: Emergency Medicine | Admitting: Emergency Medicine

## 2021-06-24 ENCOUNTER — Encounter (HOSPITAL_COMMUNITY): Payer: Self-pay

## 2021-06-24 ENCOUNTER — Telehealth: Payer: Self-pay | Admitting: Gastroenterology

## 2021-06-24 ENCOUNTER — Other Ambulatory Visit: Payer: Self-pay

## 2021-06-24 DIAGNOSIS — J45909 Unspecified asthma, uncomplicated: Secondary | ICD-10-CM | POA: Diagnosis not present

## 2021-06-24 DIAGNOSIS — R531 Weakness: Secondary | ICD-10-CM | POA: Insufficient documentation

## 2021-06-24 DIAGNOSIS — Z87891 Personal history of nicotine dependence: Secondary | ICD-10-CM | POA: Diagnosis not present

## 2021-06-24 DIAGNOSIS — E860 Dehydration: Secondary | ICD-10-CM | POA: Insufficient documentation

## 2021-06-24 DIAGNOSIS — R000 Tachycardia, unspecified: Secondary | ICD-10-CM | POA: Insufficient documentation

## 2021-06-24 DIAGNOSIS — I120 Hypertensive chronic kidney disease with stage 5 chronic kidney disease or end stage renal disease: Secondary | ICD-10-CM | POA: Diagnosis not present

## 2021-06-24 DIAGNOSIS — Z79899 Other long term (current) drug therapy: Secondary | ICD-10-CM | POA: Diagnosis not present

## 2021-06-24 DIAGNOSIS — E119 Type 2 diabetes mellitus without complications: Secondary | ICD-10-CM | POA: Insufficient documentation

## 2021-06-24 DIAGNOSIS — Z932 Ileostomy status: Secondary | ICD-10-CM

## 2021-06-24 DIAGNOSIS — T8130XA Disruption of wound, unspecified, initial encounter: Secondary | ICD-10-CM

## 2021-06-24 DIAGNOSIS — N185 Chronic kidney disease, stage 5: Secondary | ICD-10-CM | POA: Diagnosis not present

## 2021-06-24 DIAGNOSIS — E876 Hypokalemia: Secondary | ICD-10-CM | POA: Diagnosis not present

## 2021-06-24 DIAGNOSIS — N184 Chronic kidney disease, stage 4 (severe): Secondary | ICD-10-CM

## 2021-06-24 DIAGNOSIS — K50818 Crohn's disease of both small and large intestine with other complication: Secondary | ICD-10-CM

## 2021-06-24 DIAGNOSIS — Z9049 Acquired absence of other specified parts of digestive tract: Secondary | ICD-10-CM

## 2021-06-24 LAB — COMPREHENSIVE METABOLIC PANEL
ALT: 54 U/L — ABNORMAL HIGH (ref 0–44)
AST: 49 U/L — ABNORMAL HIGH (ref 15–41)
Albumin: 4.2 g/dL (ref 3.5–5.0)
Alkaline Phosphatase: 152 U/L — ABNORMAL HIGH (ref 38–126)
Anion gap: 19 — ABNORMAL HIGH (ref 5–15)
BUN: 83 mg/dL — ABNORMAL HIGH (ref 8–23)
CO2: 23 mmol/L (ref 22–32)
Calcium: 9.7 mg/dL (ref 8.9–10.3)
Chloride: 85 mmol/L — ABNORMAL LOW (ref 98–111)
Creatinine, Ser: 4.08 mg/dL — ABNORMAL HIGH (ref 0.44–1.00)
GFR, Estimated: 11 mL/min — ABNORMAL LOW (ref >60)
Glucose, Bld: 101 mg/dL — ABNORMAL HIGH (ref 70–99)
Potassium: 3.8 mmol/L (ref 3.5–5.1)
Sodium: 127 mmol/L — ABNORMAL LOW (ref 135–145)
Total Bilirubin: 0.7 mg/dL (ref 0.3–1.2)
Total Protein: 9.2 g/dL — ABNORMAL HIGH (ref 6.5–8.1)

## 2021-06-24 LAB — CBC WITH DIFFERENTIAL/PLATELET
Abs Immature Granulocytes: 0.09 10*3/uL — ABNORMAL HIGH (ref 0.00–0.07)
Basophils Absolute: 0.1 10*3/uL (ref 0.0–0.1)
Basophils Relative: 0 %
Eosinophils Absolute: 1 10*3/uL — ABNORMAL HIGH (ref 0.0–0.5)
Eosinophils Relative: 8 %
HCT: 30.7 % — ABNORMAL LOW (ref 36.0–46.0)
Hemoglobin: 10.1 g/dL — ABNORMAL LOW (ref 12.0–15.0)
Immature Granulocytes: 1 %
Lymphocytes Relative: 30 %
Lymphs Abs: 3.7 10*3/uL (ref 0.7–4.0)
MCH: 32.5 pg (ref 26.0–34.0)
MCHC: 32.9 g/dL (ref 30.0–36.0)
MCV: 98.7 fL (ref 80.0–100.0)
Monocytes Absolute: 0.9 10*3/uL (ref 0.1–1.0)
Monocytes Relative: 8 %
Neutro Abs: 6.6 10*3/uL (ref 1.7–7.7)
Neutrophils Relative %: 53 %
Platelets: 503 10*3/uL — ABNORMAL HIGH (ref 150–400)
RBC: 3.11 MIL/uL — ABNORMAL LOW (ref 3.87–5.11)
RDW: 15.8 % — ABNORMAL HIGH (ref 11.5–15.5)
WBC: 12.3 10*3/uL — ABNORMAL HIGH (ref 4.0–10.5)
nRBC: 0 % (ref 0.0–0.2)

## 2021-06-24 MED ORDER — SODIUM CHLORIDE 0.9 % IV BOLUS
1000.0000 mL | Freq: Once | INTRAVENOUS | Status: AC
  Administered 2021-06-24: 1000 mL via INTRAVENOUS

## 2021-06-24 NOTE — ED Provider Notes (Signed)
[facility] DEPT Provider Note   CSN: [id number] Arrival date & time: 06/24/21  1712     History Chief Complaint  Patient presents with   Emesis    [name] is a 65 y.o. female.  Pt presents to the ED today with n/v, weakness, and dehydration.  Pt has a hx of severe Crohn's disease.  She has been trying to get her insurance company to pay for TPN, but they have been denying this request.  Pt does require occasional IVFs due to dehydration and had a L of NS yesterday.  She still feels dehydrated and thinks she needs more fluids.  Pt feels like she is not urinating much and is worried her kidneys are shutting down.       Past Medical History:  Diagnosis Date   Abdominal pain    Hx   Allergy    Anal stenosis    Anemia    Anxiety    Arthritis    Asthma    patient does not have inhaler   Blood in stool    Hx   Blood in urine    Hx   Blood transfusion without reported diagnosis    Cataract    CKD (chronic kidney disease) stage 3, GFR 30-59 ml/min (HCC)    Crohn's colitis, other complication (HCC)    De Quervain's tenosynovitis    Depression    Difficulty urinating    Hx   Easy bruising    Esophagitis    Fainting    History - resolved - due to dehydration   Fatigue    Hx   Fibroid    Hx   Gastric polyp    GERD (gastroesophageal reflux disease)    Hearing loss    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   Hemorrhoids, external    Hemorrhoids, internal    Herpes, genital    vaginal treated 07/05/12 and pt states is resolved   History of cervical dysplasia    History of small bowel obstruction    Hyperlipidemia    currently no meds   Hyperparathyroidism    Hypertension    Hypokalemia    Hx   IBD (inflammatory bowel disease)    initially colectomy for suspected UC, 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   Obesity    Ovarian cyst    Poor dental hygiene    Pulmonary nodule, right    right upper lobe    Rectal bleeding    Hx   Rectal pain    Hx   Renal insufficiency    CKD - stage 3   RLS (restless legs syndrome)    no meds   Tooth infection 11/2016   right low   Ulcerative colitis    Visual disturbance    wears glasses   Weakness generalized    Hx - patient denies generalized weakness   Wears dentures    upper only    Patient Active Problem List   Diagnosis Date Noted   Surgical wound breakdown, initial encounter 05/27/2021   Crohn's disease of both small and large intestine ([place]) 05/20/2021   Acute kidney injury superimposed on chronic kidney disease ([place]) 05/14/2021   Thrombocytosis 05/14/2021   SIRS (systemic inflammatory response syndrome) ([place]) 05/14/2021   Hypochloremia 05/14/2021   Chronic pain of left knee 03/10/2021   Depression, major, single episode, severe ([place]) 12/18/2020   Hyperlipidemia associated with type 2 diabetes mellitus ([place])  12/18/2020   Severe sepsis ([place]) 12/07/2020   Acute pyelonephritis 12/07/2020   Closed fracture of left tibial plateau 10/30/2020   Screening for osteoporosis 10/30/2020   Diarrhea with dehydration 09/21/2020   Crohn's disease of jejunum ([place]) 08/23/2020   Chronic diarrhea 62/94/7654   Metabolic acidosis 65/11/5463   ARF (acute renal failure) ([place]) 08/23/2020   Acute renal failure superimposed on stage 4 chronic kidney disease ([place]) 08/22/2020   Hyperglycemia 02/26/2020   Immunologic deficiency syndrome ([place]) 02/26/2020   Prothrombin gene mutation ([place]) 02/26/2020   Acute pain of left shoulder 06/04/2019   Pes anserine bursitis 04/12/2018   Pain of right thumb 04/12/2018   Low back pain 06/21/2017   Acute right-sided low back pain with right-sided sciatica 06/21/2017   Gout 06/21/2017   Superficial thrombophlebitis of right upper extremity 68/08/7516   Acute basilic vein thrombosis, right 02/16/2017   Symptomatic anemia 01/15/2017   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[place]) 12/01/2015   Rectal pain 12/01/2015   IBD (inflammatory bowel  disease) 10/24/2015   Anal stricture 10/24/2015   Gout of foot 10/29/2014   Ovarian cyst, right 06/12/2014   Acute on chronic renal failure ([place]) 04/02/2013   Hydrosalpinx 02/14/2013   Hyperparathyroidism ([place]) 09/18/2012   HSV (herpes simplex virus) anogenital infection 06/28/2012   Stage 4 chronic kidney disease ([place]) 06/17/2012   LLQ abdominal pain 06/22/2011   RESTLESS LEGS SYNDROME 08/05/2009   PULMONARY NODULE, RIGHT UPPER LOBE 02/28/2009   TOBACCO ABUSE, HX OF 02/11/2009   PAIN IN JOINT, MULTIPLE SITES 02/04/2009   Nonspecific (abnormal) findings on radiological and other examination of body structure 12/23/2008   CT, CHEST, ABNORMAL 12/23/2008   HEMANGIOMA SIMPLEX 12/16/2008   Pain in joint, pelvic region and thigh 12/16/2008   SKIN RASH 12/16/2008   TINEA CORPORIS 04/18/2008   LATERAL EPICONDYLITIS, RIGHT 04/18/2008   Morbid obesity ([place]) 02/05/2008   ANEMIA 02/05/2008   ANAL STENOSIS 02/05/2008   Stool culture positive for Clostridioides difficile 02/05/2008   Personal history of other endocrine, metabolic, and immunity disorders 02/05/2008   Personal history of other diseases of digestive system 02/05/2008   SYNCOPE AND COLLAPSE 02/02/2008   Hyponatremia 12/15/2007   Dehydration 12/15/2007   HYPOKALEMIA 12/15/2007   ANEMIA-IRON DEFICIENCY 12/15/2007   LEUKOCYTOSIS 12/15/2007   Anxiety state 12/15/2007   Disorder resulting from impaired renal function 11/16/2007   TINNITUS NOS 05/25/2007   DE QUERVAIN'S TENOSYNOVITIS 05/25/2007   Hyperlipidemia LDL goal <100 10/23/2006   Depression 10/23/2006   Essential hypertension 10/23/2006   GERD 10/23/2006   Gastro-esophageal reflux disease without esophagitis 10/23/2006   Ulcerative colitis without complications ([place]) 00/17/4944   GASTRIC POLYP 11/16/2001   GERD with esophagitis 11/16/2001    Past Surgical History:  Procedure Laterality Date   ANAL DILATION     CERVICAL BIOPSY  W/ LOOP ELECTRODE EXCISION      CHOLECYSTECTOMY     COLONOSCOPY     [name]   fatty tumor removed from back     X 2   HEMORRHOID SURGERY     ILEOSTOMY CLOSURE     RESTORATIVE PROCTOCOLECTOMY     with insertion of ileoanal J Pouch with loop ileostomy   SHOULDER ARTHROSCOPY WITH CAPSULORRHAPHY Left 06/14/2019   Procedure: LEFT SHOULDER ARTHRSCOPIC REPAIR OF BONY BANKART FRACTURE;  Surgeon: Tania Ade, MD;  Location: [facility];  Service: Orthopedics;  Laterality: Left;  REQUEST 90 MINUTE   SIGMOIDOSCOPY     TOTAL ABDOMINAL HYSTERECTOMY  1998   TAH/LSO   TUBAL LIGATION  UPPER GASTROINTESTINAL ENDOSCOPY     [name]     OB History     Gravida  4   Para  2   Term  2   Preterm      AB  2   Living  2      SAB  2   IAB      Ectopic      Multiple      Live Births  2           Family History  Problem Relation Age of Onset   Ulcerative colitis Father    Hypertension Father    Heart attack Father    Hypertension Mother    Heart disease Mother        s/p pci   Ulcerative colitis Daughter    Irritable bowel syndrome Other        grandchildren   Diabetes Sister    Cancer Sister        uterine   Cancer Maternal Uncle        LUNG   Colon cancer Neg Hx    Esophageal cancer Neg Hx    Stomach cancer Neg Hx    Rectal cancer Neg Hx     Social History   Tobacco Use   Smoking status: Former    Packs/day: 1.00    Years: 4.00    Pack years: 4.00    Types: Cigarettes    Quit date: 05/21/1979    Years since quitting: 42.1   Smokeless tobacco: Never  Vaping Use   Vaping Use: Never used  Substance Use Topics   Alcohol use: No    Alcohol/week: 0.0 standard drinks   Drug use: No    Home Medications Prior to Admission medications   Medication Sig Start Date End Date Taking? Authorizing Provider  acetaminophen (TYLENOL) 500 MG tablet Take 500-1,000 mg by mouth at bedtime.    [provider]  allopurinol (ZYLOPRIM) 300 MG tablet TAKE 1 TABLET BY MOUTH EVERY DAY Patient taking  differently: Take 300 mg by mouth daily. 05/18/21   Ann Held, DO  Calcium Carbonate Antacid (TUMS EXTRA STRENGTH 750 PO) Take 1-2 tablets by mouth See admin instructions. Chew 1-2 tablets by mouth as needed for heartburn or reflux    [provider]  DULoxetine (CYMBALTA) 60 MG capsule Take 2 capsules (120 mg total) by mouth daily. Patient taking differently: Take 120 mg by mouth at bedtime. 11/05/20   Carollee Herter, Yvonne R, DO  ENTYVIO 300 MG injection Inject 300 mg into the vein every 28 (twenty-eight) days.    [provider]  famotidine (PEPCID) 20 MG tablet TAKE 1 TABLET BY MOUTH TWICE A DAY Patient taking differently: Take 20 mg by mouth daily as needed for heartburn or indigestion. 04/01/21   Armbruster, Carlota Raspberry, MD  folic acid (FOLVITE) 1 MG tablet Take 2 tablets (2 mg total) by mouth daily. Patient taking differently: Take 1-2 mg by mouth daily. 05/26/17   [name], MD  gabapentin (NEURONTIN) 100 MG capsule TAKE 1 CAPSULE BY MOUTH THREE TIMES A DAY Patient taking differently: Take 100 mg by mouth 3 (three) times daily. 02/13/21   Ann Held, DO  metoprolol succinate (TOPROL-XL) 100 MG 24 hr tablet Take 50-100 mg by mouth See admin instructions. Take 100 mg by mouth in the morning and 50 mg at bedtime    [provider]  ondansetron (ZOFRAN) 4 MG tablet Take 1 tablet (  4 mg total) by mouth every 6 (six) hours as needed for up to 20 doses for nausea. 05/16/21   Antonieta Pert, MD  oxyCODONE (OXY IR/ROXICODONE) 5 MG immediate release tablet Take 5 mg by mouth at bedtime as needed for moderate pain. Patient not taking: Reported on 05/26/2021 04/27/21   [provider]  Vitamin D, Ergocalciferol, (DRISDOL) 1.25 MG (50000 UNIT) CAPS capsule Take 50,000 Units by mouth every 30 (thirty) days.    [provider]    Allergies    Sulfa antibiotics, Morphine, and Sulfonamide derivatives  Review of Systems   Review of Systems   Constitutional:  Positive for fatigue.  Gastrointestinal:  Positive for nausea.  Neurological:  Positive for weakness.  All other systems reviewed and are negative.  Physical Exam Updated Vital Signs BP 100/66   Pulse 100   Temp 98.4 F (36.9 C) (Oral)   Resp 17   SpO2 100%   Physical Exam Vitals and nursing note reviewed.  Constitutional:      Appearance: Normal appearance.  HENT:     Head: Normocephalic and atraumatic.     Right Ear: External ear normal.     Left Ear: External ear normal.     Nose: Nose normal.     Mouth/Throat:     Mouth: Mucous membranes are dry.  Eyes:     Extraocular Movements: Extraocular movements intact.     Conjunctiva/sclera: Conjunctivae normal.     Pupils: Pupils are equal, round, and reactive to light.  Cardiovascular:     Rate and Rhythm: Regular rhythm. Tachycardia present.     Pulses: Normal pulses.     Heart sounds: Normal heart sounds.  Pulmonary:     Effort: Pulmonary effort is normal.     Breath sounds: Normal breath sounds.  Abdominal:     General: Abdomen is flat. Bowel sounds are normal.     Palpations: Abdomen is soft.  Musculoskeletal:        General: Normal range of motion.     Cervical back: Normal range of motion and neck supple.  Skin:    General: Skin is warm.     Capillary Refill: Capillary refill takes less than 2 seconds.  Neurological:     General: No focal deficit present.     Mental Status: She is alert and oriented to person, place, and time.  Psychiatric:        Mood and Affect: Mood normal.        Behavior: Behavior normal.    ED Results / Procedures / Treatments   Labs (all labs ordered are listed, but only abnormal results are displayed) Labs Reviewed  CBC WITH DIFFERENTIAL/PLATELET - Abnormal; Notable for the following components:      Result Value   WBC 12.3 (*)    RBC 3.11 (*)    Hemoglobin 10.1 (*)    HCT 30.7 (*)    RDW 15.8 (*)    Platelets 503 (*)    Eosinophils Absolute 1.0 (*)     Abs Immature Granulocytes 0.09 (*)    All other components within normal limits  COMPREHENSIVE METABOLIC PANEL - Abnormal; Notable for the following components:   Sodium 127 (*)    Chloride 85 (*)    Glucose, Bld 101 (*)    BUN 83 (*)    Creatinine, Ser 4.08 (*)    Total Protein 9.2 (*)    AST 49 (*)    ALT 54 (*)    Alkaline Phosphatase 152 (*)  GFR, Estimated 11 (*)    Anion gap 19 (*)    All other components within normal limits  URINALYSIS, ROUTINE W REFLEX MICROSCOPIC    EKG None  Radiology No results found.  Procedures Procedures   Medications Ordered in ED Medications  sodium chloride 0.9 % bolus 1,000 mL (0 mLs Intravenous Stopped 06/24/21 2030)    ED Course  I have reviewed the triage vital signs and the nursing notes.  Pertinent labs & imaging results that were available during my care of the patient were reviewed by me and considered in my medical decision making (see chart for details).    MDM Rules/Calculators/A&P                           Cr is the same as what it was on 9/29.  She feels better after 1L.  She is able to ambulate without any problems.  She does not want to wait for her urine.  She is ready to go home.  She knows to return if worse.  F/u with pcp.  Final Clinical Impression(s) / ED Diagnoses Final diagnoses:  Dehydration  CKD (chronic kidney disease) stage 5, GFR less than 15 ml/min [facility])    Rx / DC Orders ED Discharge Orders     None        Isla Pence, MD 06/24/21 2115

## 2021-06-24 NOTE — Telephone Encounter (Signed)
Spoke with Mickel Baas at the [facility] infusion center [address], she states that I will have to enter new orders for patient. New orders entered for 1L of NS for two times a week. Called pt and she states that she is going to go to [facility] today for fluids so she doesn't pass out. Pt states that she is also going to reach out to her insurance company to see if she is being told the truth. She states that insurance wont cover central line for feedings at home but will cover NG tube but Dr. Drue Flirt did not want her to do that. he states that she will see Dr. Drue Flirt on 06/30/21 at 9:15 am for a follow up.

## 2021-06-24 NOTE — ED Triage Notes (Signed)
Pt arrived via POV, c/o emesis and fatigue x2 days. States she went and got some IV fluids yesterday, was feeling slightly better and then worsening today. Denies any abd pain diarrhea. Does endorse urinating very little compared to normal.

## 2021-06-24 NOTE — Telephone Encounter (Signed)
Inbound call from pt requesting a call back asking if she can set up an appointment for tomorrow to get more fluids if all possible. Please advise. Thank you.

## 2021-06-24 NOTE — Telephone Encounter (Signed)
Yes if she feels she needs it, can give her another 1 L of NS to keep her out of the hospital.  Has she decided what she wants to do at this point? I would recommend she follow up with Dr. Drue Flirt, and see if they can find a way to get her on TPN. Not sure if they have appealed to her insurance yet. Thanks

## 2021-06-25 ENCOUNTER — Other Ambulatory Visit: Payer: Self-pay

## 2021-06-25 ENCOUNTER — Ambulatory Visit (INDEPENDENT_AMBULATORY_CARE_PROVIDER_SITE_OTHER): Payer: Medicare Other

## 2021-06-25 VITALS — BP 117/75 | HR 75 | Temp 97.4°F | Resp 16 | Wt 183.4 lb

## 2021-06-25 DIAGNOSIS — E860 Dehydration: Secondary | ICD-10-CM | POA: Diagnosis not present

## 2021-06-25 MED ORDER — SODIUM CHLORIDE 0.9 % IV SOLN: INTRAVENOUS | Status: AC

## 2021-06-25 NOTE — Progress Notes (Signed)
Diagnosis: fluid Replacement   Provider:  Marshell Garfinkel, MD  Procedure: Infusion  IV Type: Peripheral, IV Location: L Forearm  Hydration, Dose: 1054m  Infusion Start Time: 1107  Infusion Stop Time: 1320  Post Infusion IV Care: Peripheral IV Discontinued  Discharge: Condition: Good, Destination: Home . AVS provided to patient.   Performed by:  SCharlie Pitter RN

## 2021-06-29 ENCOUNTER — Other Ambulatory Visit: Payer: Self-pay

## 2021-06-29 ENCOUNTER — Ambulatory Visit (INDEPENDENT_AMBULATORY_CARE_PROVIDER_SITE_OTHER): Payer: Medicare Other

## 2021-06-29 VITALS — BP 106/69 | HR 93 | Temp 97.8°F | Resp 18 | Ht 63.0 in | Wt 182.0 lb

## 2021-06-29 DIAGNOSIS — E860 Dehydration: Secondary | ICD-10-CM | POA: Diagnosis not present

## 2021-06-29 MED ORDER — SODIUM CHLORIDE 0.9 % IV SOLN: INTRAVENOUS | Status: AC

## 2021-06-29 NOTE — Progress Notes (Signed)
Diagnosis: Dehydration  Provider:  Marshell Garfinkel, MD  Procedure: Infusion  IV Type: Peripheral, IV Location: L Forearm  Normal Saline, Dose: 1L  Infusion Start Time: 0930  Infusion Stop Time: 1140  Post Infusion IV Care: Peripheral IV Discontinued  Discharge: Condition: Good, Destination: Home . AVS provided to patient.   Performed by:  Charlie Pitter, RN

## 2021-06-30 DIAGNOSIS — E860 Dehydration: Secondary | ICD-10-CM | POA: Diagnosis not present

## 2021-06-30 DIAGNOSIS — K91850 Pouchitis: Secondary | ICD-10-CM | POA: Diagnosis not present

## 2021-06-30 DIAGNOSIS — K91858 Other complications of intestinal pouch: Secondary | ICD-10-CM | POA: Diagnosis not present

## 2021-07-02 ENCOUNTER — Other Ambulatory Visit: Payer: Self-pay

## 2021-07-02 ENCOUNTER — Ambulatory Visit (INDEPENDENT_AMBULATORY_CARE_PROVIDER_SITE_OTHER): Payer: Medicare Other

## 2021-07-02 VITALS — BP 120/76 | HR 98 | Temp 97.8°F | Resp 18 | Ht 63.0 in | Wt 184.4 lb

## 2021-07-02 DIAGNOSIS — E860 Dehydration: Secondary | ICD-10-CM | POA: Diagnosis not present

## 2021-07-02 MED ORDER — SODIUM CHLORIDE 0.9 % IV SOLN: INTRAVENOUS | Status: AC

## 2021-07-02 NOTE — Progress Notes (Signed)
Diagnosis: Dehydration  Provider:  Marshell Garfinkel, MD  Procedure: Infusion  IV Type: Peripheral, IV Location: R Forearm  NS 1 L Bolus, Dose: 1000 mg  Infusion Start Time: 1110  Infusion Stop Time: 1828  Post Infusion IV Care: Observation period completed  Discharge: Condition: Good, Destination: Home . AVS provided to patient.   Performed by:  Paul Dykes, RN

## 2021-07-06 ENCOUNTER — Ambulatory Visit (INDEPENDENT_AMBULATORY_CARE_PROVIDER_SITE_OTHER): Payer: Medicare Other | Admitting: *Deleted

## 2021-07-06 ENCOUNTER — Other Ambulatory Visit: Payer: Self-pay

## 2021-07-06 DIAGNOSIS — Z932 Ileostomy status: Secondary | ICD-10-CM

## 2021-07-06 DIAGNOSIS — K50818 Crohn's disease of both small and large intestine with other complication: Secondary | ICD-10-CM

## 2021-07-06 DIAGNOSIS — Z9049 Acquired absence of other specified parts of digestive tract: Secondary | ICD-10-CM

## 2021-07-06 DIAGNOSIS — T8130XA Disruption of wound, unspecified, initial encounter: Secondary | ICD-10-CM

## 2021-07-06 DIAGNOSIS — N184 Chronic kidney disease, stage 4 (severe): Secondary | ICD-10-CM

## 2021-07-06 DIAGNOSIS — E860 Dehydration: Secondary | ICD-10-CM | POA: Diagnosis not present

## 2021-07-06 MED ORDER — SODIUM CHLORIDE 0.9 % IV SOLN: INTRAVENOUS | Status: DC

## 2021-07-06 NOTE — Progress Notes (Signed)
Diagnosis: Hydration  Provider:  Marshell Garfinkel, MD  Procedure: Infusion  IV Type: Peripheral, IV Location: L Forearm  1 Liter NS , Dose: 1000cc  Infusion Start Time: 0340 am  Infusion Stop Time: 1034 am  Post Infusion IV Care: Observation period completed and Peripheral IV Discontinued  Discharge: Condition: Good, Destination: Home . AVS provided to patient.   Performed by:  Oren Beckmann, RN

## 2021-07-09 ENCOUNTER — Ambulatory Visit (INDEPENDENT_AMBULATORY_CARE_PROVIDER_SITE_OTHER): Payer: Medicare Other

## 2021-07-09 ENCOUNTER — Other Ambulatory Visit: Payer: Self-pay

## 2021-07-09 VITALS — BP 140/90 | HR 92 | Temp 97.9°F | Resp 18 | Ht 63.0 in | Wt 184.2 lb

## 2021-07-09 DIAGNOSIS — E860 Dehydration: Secondary | ICD-10-CM | POA: Diagnosis not present

## 2021-07-09 MED ORDER — SODIUM CHLORIDE 0.9 % IV SOLN: INTRAVENOUS | Status: AC

## 2021-07-09 NOTE — Progress Notes (Signed)
Diagnosis: Hydration  Provider:  Marshell Garfinkel, MD  Procedure: Infusion  IV Type: Peripheral, IV Location: R Forearm  0.9% sodium chloride, 2 L  Infusion Start Time: 4373  Infusion Stop Time: 1418  Post Infusion IV Care: Peripheral IV Discontinued  Discharge: Condition: Good, Destination: Home . AVS provided to patient.   Performed by:  Willis Kuipers, [place] Handing, LPN

## 2021-07-13 ENCOUNTER — Other Ambulatory Visit: Payer: Self-pay

## 2021-07-13 ENCOUNTER — Ambulatory Visit (INDEPENDENT_AMBULATORY_CARE_PROVIDER_SITE_OTHER): Payer: Medicare Other

## 2021-07-13 VITALS — BP 155/97 | HR 97 | Temp 98.1°F | Resp 16 | Ht 63.0 in | Wt 184.2 lb

## 2021-07-13 DIAGNOSIS — E860 Dehydration: Secondary | ICD-10-CM

## 2021-07-13 MED ORDER — SODIUM CHLORIDE 0.9 % IV BOLUS
1000.0000 mL | Freq: Once | INTRAVENOUS | Status: AC
  Administered 2021-07-13: 1000 mL via INTRAVENOUS
  Filled 2021-07-13: qty 1000

## 2021-07-13 NOTE — Progress Notes (Signed)
Diagnosis: Dehydration  Provider:  Marshell Garfinkel, MD  Procedure: Infusion  IV Type: Peripheral, IV Location: R Hand  sodium chloride 0.9 % bolus 1,000 mL    , Dose: 1022m.  Infusion Start Time: 0900  07/13/2021  Infusion Stop Time: 11.14 07/13/2021  Post Infusion IV Care: Peripheral IV Discontinued  Discharge: Condition: Good, Destination: Home . AVS provided to patient.   Performed by:  SArnoldo Morale RN

## 2021-07-16 ENCOUNTER — Ambulatory Visit (INDEPENDENT_AMBULATORY_CARE_PROVIDER_SITE_OTHER): Payer: Medicare Other

## 2021-07-16 ENCOUNTER — Other Ambulatory Visit: Payer: Self-pay

## 2021-07-16 DIAGNOSIS — E860 Dehydration: Secondary | ICD-10-CM

## 2021-07-16 MED ORDER — SODIUM CHLORIDE 0.9 % IV SOLN: INTRAVENOUS | Status: DC

## 2021-07-16 NOTE — Progress Notes (Signed)
Diagnosis: Dehydration  Provider:  Marshell Garfinkel, MD  Procedure: Infusion  IV Type: Peripheral, IV Location: L Antecubital  Normal Saline, Dose: 1L  Infusion Start Time: 0820  Infusion Stop Time: 1050  Post Infusion IV Care: Observation period completed  Discharge: Condition: Good, Destination: Home . AVS provided to patient.   Performed by:  Paul Dykes, RN

## 2021-07-20 ENCOUNTER — Other Ambulatory Visit: Payer: Self-pay

## 2021-07-20 ENCOUNTER — Ambulatory Visit (INDEPENDENT_AMBULATORY_CARE_PROVIDER_SITE_OTHER): Payer: Medicare Other

## 2021-07-20 VITALS — BP 139/81 | HR 94 | Temp 97.8°F | Resp 16 | Ht 63.0 in | Wt 181.4 lb

## 2021-07-20 DIAGNOSIS — E860 Dehydration: Secondary | ICD-10-CM

## 2021-07-20 MED ORDER — SODIUM CHLORIDE 0.9 % IV SOLN: INTRAVENOUS | Status: AC

## 2021-07-20 NOTE — Progress Notes (Signed)
Diagnosis: Dehydration  Provider:  Marshell Garfinkel, MD  Procedure: Infusion  IV Type: Peripheral, IV Location: L Forearm  Normal Saline, Dose: 1000 ml  Infusion Start Time: 09.29 07/21/19  Infusion Stop Time: 11.48 07/20/2021  Post Infusion IV Care: Peripheral IV Discontinued  Discharge: Condition: Good, Destination: Home . AVS provided to patient.   Performed by:  Arnoldo Morale, RN

## 2021-07-23 ENCOUNTER — Ambulatory Visit (INDEPENDENT_AMBULATORY_CARE_PROVIDER_SITE_OTHER): Payer: Medicare Other | Admitting: *Deleted

## 2021-07-23 ENCOUNTER — Other Ambulatory Visit: Payer: Self-pay

## 2021-07-23 VITALS — BP 158/96 | HR 100 | Temp 97.5°F | Resp 16 | Ht 63.0 in | Wt 183.2 lb

## 2021-07-23 DIAGNOSIS — E860 Dehydration: Secondary | ICD-10-CM | POA: Diagnosis not present

## 2021-07-23 MED ORDER — SODIUM CHLORIDE 0.9 % IV SOLN: INTRAVENOUS | Status: DC

## 2021-07-23 NOTE — Progress Notes (Signed)
Diagnosis: Dehydration  Provider:  Marshell Garfinkel, MD  Procedure: Infusion  IV Type: Peripheral, IV Location: L Antecubital  Normal Saline IV, Dose: 1000 ml  Infusion Start Time: 9865 am  Infusion Stop Time: 1055am  Post Infusion IV Care: Observation period completed and Peripheral IV Discontinued  Discharge: Condition: Good, Destination: Home . AVS provided to patient.   Performed by:  Oren Beckmann, RN

## 2021-07-27 ENCOUNTER — Other Ambulatory Visit: Payer: Self-pay

## 2021-07-27 ENCOUNTER — Ambulatory Visit (INDEPENDENT_AMBULATORY_CARE_PROVIDER_SITE_OTHER): Payer: Medicare Other | Admitting: *Deleted

## 2021-07-27 VITALS — BP 162/95 | HR 85 | Temp 98.0°F | Resp 16 | Ht 63.0 in | Wt 181.2 lb

## 2021-07-27 DIAGNOSIS — E860 Dehydration: Secondary | ICD-10-CM

## 2021-07-27 MED ORDER — SODIUM CHLORIDE 0.9 % IV SOLN: INTRAVENOUS | Status: DC

## 2021-07-27 NOTE — Progress Notes (Signed)
Diagnosis: Hydration  Provider:  Marshell Garfinkel, MD  Procedure: Infusion  IV Type: Peripheral, IV Location: right antecubital  Normal Saline, Dose: 1000 ml  Infusion Start Time: 0830 am  Infusion Stop Time: 1100 am  Post Infusion IV Care: Observation period completed and Peripheral IV Discontinued  Discharge: Condition: Good, Destination: Home . AVS provided to patient.   Performed by:  Oren Beckmann, RN

## 2021-07-27 NOTE — Progress Notes (Signed)
Diagnosis: Dehydration  Provider:  Marshell Garfinkel, MD  Procedure: Infusion  IV Type: Peripheral, IV Location: L Antecubital  Normal Saline, Dose: 1L  Infusion Start Time: 0820  Infusion Stop Time: 1050  Post Infusion IV Care: Observation period completed  Discharge: Condition: Good, Destination: Home . AVS provided to patient.   Performed by:  Ruthell Rummage RN

## 2021-07-29 ENCOUNTER — Ambulatory Visit (INDEPENDENT_AMBULATORY_CARE_PROVIDER_SITE_OTHER): Payer: Medicare Other

## 2021-07-29 ENCOUNTER — Other Ambulatory Visit: Payer: Self-pay

## 2021-07-29 VITALS — BP 130/81 | HR 94 | Temp 97.8°F | Resp 16 | Ht 63.0 in | Wt 180.6 lb

## 2021-07-29 DIAGNOSIS — E860 Dehydration: Secondary | ICD-10-CM | POA: Diagnosis not present

## 2021-07-29 NOTE — Progress Notes (Signed)
Diagnosis: Dehydration  Provider:  Marshell Garfinkel, MD  Procedure: Infusion  IV Type: Peripheral, IV Location: L Hand  Normal Saline, Dose: 1000 ml  Infusion Start Time: 0824  Infusion Stop Time: 6773  Post Infusion IV Care: Peripheral IV Discontinued  Discharge: Condition: Good, Destination: Home . AVS provided to patient.   Performed by:  Charlie Pitter, RN

## 2021-08-03 ENCOUNTER — Other Ambulatory Visit: Payer: Self-pay

## 2021-08-03 ENCOUNTER — Ambulatory Visit (INDEPENDENT_AMBULATORY_CARE_PROVIDER_SITE_OTHER): Payer: Medicare Other | Admitting: *Deleted

## 2021-08-03 VITALS — BP 135/82 | HR 98 | Temp 98.3°F | Resp 16 | Ht 63.0 in | Wt 183.0 lb

## 2021-08-03 DIAGNOSIS — E860 Dehydration: Secondary | ICD-10-CM | POA: Diagnosis not present

## 2021-08-03 NOTE — Progress Notes (Signed)
Diagnosis: Dehydration  Provider:  Marshell Garfinkel, MD  Procedure: Infusion  IV Type: Peripheral, IV Location: R Hand  Normal Saline, Dose: 1000 ml  Infusion Start Time: 8315 am  Infusion Stop Time: 1045 am  Post Infusion IV Care: Observation period completed and Peripheral IV Discontinued  Discharge: Condition: Good, Destination: Home . AVS provided to patient.   Performed by:  Oren Beckmann, RN

## 2021-08-04 DIAGNOSIS — K91850 Pouchitis: Secondary | ICD-10-CM | POA: Diagnosis not present

## 2021-08-04 DIAGNOSIS — Z01812 Encounter for preprocedural laboratory examination: Secondary | ICD-10-CM | POA: Diagnosis not present

## 2021-08-04 DIAGNOSIS — K91858 Other complications of intestinal pouch: Secondary | ICD-10-CM | POA: Diagnosis not present

## 2021-08-04 DIAGNOSIS — Z01818 Encounter for other preprocedural examination: Secondary | ICD-10-CM | POA: Diagnosis not present

## 2021-08-04 NOTE — Progress Notes (Signed)
Diagnosis: Dehydration  Provider:  Marshell Garfinkel, MD  Procedure: Infusion  IV Type: Peripheral, IV Location: R Forearm  NS 1 L Bolus, Dose: 1000 mg  Infusion Start Time: 1110  Infusion Stop Time: 3382  Post Infusion IV Care: Observation period completed  Discharge: Condition: Good, Destination: Home . AVS provided to patient.   Performed by:  Koren Shiver, RN

## 2021-08-05 DIAGNOSIS — M109 Gout, unspecified: Secondary | ICD-10-CM | POA: Diagnosis not present

## 2021-08-05 DIAGNOSIS — D631 Anemia in chronic kidney disease: Secondary | ICD-10-CM | POA: Diagnosis not present

## 2021-08-05 DIAGNOSIS — R768 Other specified abnormal immunological findings in serum: Secondary | ICD-10-CM | POA: Diagnosis not present

## 2021-08-05 DIAGNOSIS — Z23 Encounter for immunization: Secondary | ICD-10-CM | POA: Diagnosis not present

## 2021-08-05 DIAGNOSIS — N179 Acute kidney failure, unspecified: Secondary | ICD-10-CM | POA: Diagnosis not present

## 2021-08-05 DIAGNOSIS — N184 Chronic kidney disease, stage 4 (severe): Secondary | ICD-10-CM | POA: Diagnosis not present

## 2021-08-05 DIAGNOSIS — I129 Hypertensive chronic kidney disease with stage 1 through stage 4 chronic kidney disease, or unspecified chronic kidney disease: Secondary | ICD-10-CM | POA: Diagnosis not present

## 2021-08-05 DIAGNOSIS — K91850 Pouchitis: Secondary | ICD-10-CM | POA: Diagnosis not present

## 2021-08-05 DIAGNOSIS — D509 Iron deficiency anemia, unspecified: Secondary | ICD-10-CM | POA: Diagnosis not present

## 2021-08-05 DIAGNOSIS — E876 Hypokalemia: Secondary | ICD-10-CM | POA: Diagnosis not present

## 2021-08-05 DIAGNOSIS — E8881 Metabolic syndrome: Secondary | ICD-10-CM | POA: Diagnosis not present

## 2021-08-05 DIAGNOSIS — N2581 Secondary hyperparathyroidism of renal origin: Secondary | ICD-10-CM | POA: Diagnosis not present

## 2021-08-06 ENCOUNTER — Ambulatory Visit (INDEPENDENT_AMBULATORY_CARE_PROVIDER_SITE_OTHER): Payer: Medicare Other

## 2021-08-06 ENCOUNTER — Other Ambulatory Visit: Payer: Self-pay

## 2021-08-06 VITALS — BP 139/89 | HR 100 | Temp 98.2°F | Resp 16 | Ht 63.0 in | Wt 181.2 lb

## 2021-08-06 DIAGNOSIS — E860 Dehydration: Secondary | ICD-10-CM | POA: Diagnosis not present

## 2021-08-06 NOTE — Progress Notes (Signed)
Diagnosis: Hydration  Provider:  Marshell Garfinkel, MD  Procedure: Infusion  IV Type: Peripheral, IV Location: L Antecubital  Sodium Chloride, Dose: 1000 mg  Infusion Start Time: 2482NO  Infusion Stop Time: 1048  Post Infusion IV Care: Peripheral IV Discontinued  Discharge: Condition: Good, Destination: Home . AVS provided to patient.   Performed by:  Koren Shiver, RN

## 2021-08-10 ENCOUNTER — Other Ambulatory Visit: Payer: Self-pay

## 2021-08-10 ENCOUNTER — Ambulatory Visit (INDEPENDENT_AMBULATORY_CARE_PROVIDER_SITE_OTHER): Payer: Medicare Other

## 2021-08-10 VITALS — BP 146/91 | HR 93 | Temp 98.2°F | Resp 16 | Ht 63.0 in | Wt 182.8 lb

## 2021-08-10 DIAGNOSIS — E860 Dehydration: Secondary | ICD-10-CM | POA: Diagnosis not present

## 2021-08-10 NOTE — Progress Notes (Signed)
Diagnosis: Dehydration  Provider:  Marshell Garfinkel, MD  Procedure: Infusion  IV Type: Peripheral, IV Location: L Antecubital  Normal Saline, Dose: 1000 ml  Infusion Start Time: 0822  Infusion Stop Time: 1030  Post Infusion IV Care: Peripheral IV Discontinued  Discharge: Condition: Good, Destination: Home . Pt declined AVS.   Performed by:  Koren Shiver, RN

## 2021-08-12 ENCOUNTER — Other Ambulatory Visit: Payer: Self-pay

## 2021-08-12 ENCOUNTER — Ambulatory Visit (INDEPENDENT_AMBULATORY_CARE_PROVIDER_SITE_OTHER): Payer: Medicare Other

## 2021-08-12 VITALS — BP 154/96 | HR 103 | Temp 97.8°F | Resp 18 | Ht 63.0 in | Wt 184.4 lb

## 2021-08-12 DIAGNOSIS — E860 Dehydration: Secondary | ICD-10-CM | POA: Diagnosis not present

## 2021-08-12 NOTE — Progress Notes (Addendum)
Diagnosis: Dehydration   Provider:  Marshell Garfinkel, MD  Procedure: Infusion  IV Type: Peripheral, IV Location: R Forearm  Normal Saline, Dose: 1000 ml  Infusion Start Time: 0845  Infusion Stop Time: 5456  Post Infusion IV Care: Peripheral IV Discontinued  Discharge: Condition: Good, Destination: Home . AVS provided to patient.   Performed by:  Charlie Pitter, RN

## 2021-08-14 ENCOUNTER — Other Ambulatory Visit: Payer: Self-pay

## 2021-08-14 ENCOUNTER — Ambulatory Visit (INDEPENDENT_AMBULATORY_CARE_PROVIDER_SITE_OTHER): Payer: Medicare Other

## 2021-08-14 VITALS — BP 157/95 | HR 91 | Temp 97.7°F | Resp 18 | Ht 63.0 in | Wt 185.2 lb

## 2021-08-14 DIAGNOSIS — E860 Dehydration: Secondary | ICD-10-CM

## 2021-08-14 MED ORDER — SODIUM CHLORIDE 0.9 % IV SOLN
INTRAVENOUS | Status: DC
  Administered 2021-08-17 – 2021-09-03 (×2): 1000 mL via INTRAVENOUS

## 2021-08-14 MED ORDER — SODIUM CHLORIDE 0.9 % IV SOLN: INTRAVENOUS | Status: DC

## 2021-08-14 MED ORDER — SODIUM CHLORIDE 0.9 % IV SOLN: Freq: Once | INTRAVENOUS | Status: AC

## 2021-08-14 NOTE — Progress Notes (Signed)
Diagnosis: Hydration Therapy  Provider:  Marshell Garfinkel, MD  Procedure: Infusion  IV Type: Peripheral, IV Location: L Antecubital  Normal Saline, Dose: 1000 ml  Infusion Start Time: 9417  Infusion Stop Time: 9199  Post Infusion IV Care: Peripheral IV Discontinued  Discharge: Condition: Good, Destination: Home . AVS provided to patient.   Performed by:  Koren Shiver, RN

## 2021-08-16 ENCOUNTER — Other Ambulatory Visit: Payer: Self-pay | Admitting: Family Medicine

## 2021-08-17 ENCOUNTER — Ambulatory Visit (INDEPENDENT_AMBULATORY_CARE_PROVIDER_SITE_OTHER): Payer: Medicare Other

## 2021-08-17 ENCOUNTER — Other Ambulatory Visit: Payer: Self-pay

## 2021-08-17 VITALS — BP 114/75 | HR 96 | Temp 98.0°F | Resp 20 | Ht 65.0 in | Wt 182.2 lb

## 2021-08-17 DIAGNOSIS — E860 Dehydration: Secondary | ICD-10-CM

## 2021-08-17 NOTE — Progress Notes (Signed)
Diagnosis: Hydration  Provider:  Marshell Garfinkel, MD  Procedure: Infusion  IV Type: Peripheral, IV Location: R Hand  Normal Saline, Dose: 1000 ml  Infusion Start Time: 2179  Infusion Stop Time: 8102  Post Infusion IV Care: Peripheral IV Discontinued  Discharge: Condition: Good, Destination: Home . AVS provided to patient.   Performed by:  Koren Shiver, RN

## 2021-08-19 ENCOUNTER — Other Ambulatory Visit: Payer: Self-pay

## 2021-08-19 ENCOUNTER — Ambulatory Visit (INDEPENDENT_AMBULATORY_CARE_PROVIDER_SITE_OTHER): Payer: Medicare Other

## 2021-08-19 VITALS — BP 148/92 | HR 94 | Temp 98.1°F | Resp 16 | Ht 63.0 in | Wt 183.0 lb

## 2021-08-19 DIAGNOSIS — E860 Dehydration: Secondary | ICD-10-CM | POA: Diagnosis not present

## 2021-08-19 NOTE — Progress Notes (Signed)
Diagnosis: dehydration   Provider:  Marshell Garfinkel, MD  Procedure: Infusion  IV Type: Peripheral, IV Location: L Antecubital  Normal Saline, Dose: 1000 ml  Infusion Start Time: 3344  Infusion Stop Time: 1050  Post Infusion IV Care: Peripheral IV Discontinued  Discharge: Condition: Good, Destination: Home . AVS provided to patient.   Performed by:  Koren Shiver, RN

## 2021-08-21 ENCOUNTER — Ambulatory Visit (INDEPENDENT_AMBULATORY_CARE_PROVIDER_SITE_OTHER): Payer: Medicare Other

## 2021-08-21 ENCOUNTER — Other Ambulatory Visit: Payer: Self-pay

## 2021-08-21 VITALS — BP 157/91 | HR 91 | Temp 98.1°F | Resp 18 | Ht 63.0 in | Wt 182.6 lb

## 2021-08-21 DIAGNOSIS — E860 Dehydration: Secondary | ICD-10-CM | POA: Diagnosis not present

## 2021-08-21 NOTE — Progress Notes (Signed)
Diagnosis: dehydration   Provider:  Marshell Garfinkel, MD  Procedure: Infusion  IV Type: Peripheral, IV Location: L Forearm  Normal Saline, Dose: 1000 ml  Infusion Start Time: 5361  Infusion Stop Time: 1020  Post Infusion IV Care: Peripheral IV Discontinued  Discharge: Condition: Good, Destination: Home . AVS provided to patient.   Performed by:  Jaxxon Naeem, [place] Handing, LPN

## 2021-08-24 ENCOUNTER — Other Ambulatory Visit: Payer: Self-pay

## 2021-08-24 ENCOUNTER — Ambulatory Visit (INDEPENDENT_AMBULATORY_CARE_PROVIDER_SITE_OTHER): Payer: Medicare Other

## 2021-08-24 VITALS — BP 169/96 | HR 83 | Temp 97.7°F | Resp 18 | Ht 63.0 in | Wt 182.0 lb

## 2021-08-24 DIAGNOSIS — E860 Dehydration: Secondary | ICD-10-CM

## 2021-08-24 NOTE — Progress Notes (Signed)
Diagnosis: Dehydration  Provider:  Marshell Garfinkel, MD  Procedure: Infusion  IV Type: Peripheral, IV Location: R Hand  Normal Saline, Dose: 1000 ml  Infusion Start Time: 08.45 08/24/2021  Infusion Stop Time: 11.00 08/24/2021  Post Infusion IV Care: Peripheral IV Discontinued  Discharge: Condition: Good, Destination: Home . AVS provided to patient.   Performed by:  Arnoldo Morale, RN

## 2021-08-26 ENCOUNTER — Other Ambulatory Visit: Payer: Self-pay

## 2021-08-26 ENCOUNTER — Ambulatory Visit (INDEPENDENT_AMBULATORY_CARE_PROVIDER_SITE_OTHER): Payer: Medicare Other | Admitting: *Deleted

## 2021-08-26 VITALS — BP 146/93 | HR 89 | Temp 97.9°F | Resp 16 | Ht 63.0 in | Wt 183.8 lb

## 2021-08-26 DIAGNOSIS — E860 Dehydration: Secondary | ICD-10-CM | POA: Diagnosis not present

## 2021-08-26 NOTE — Progress Notes (Addendum)
Diagnosis: Dehydration  Provider:  Marshell Garfinkel, MD  Procedure: Infusion  IV Type: Peripheral, IV Location: L Forearm  Normal Saline, Dose 1000 ml  Infusion Start Time: 70623 am  Infusion Stop Time: 7628 am  Post Infusion IV Care: Observation period completed and Peripheral IV Discontinued  Discharge: Condition: Good, Destination: Home . AVS provided to patient.   Performed by:  Oren Beckmann, RN

## 2021-08-28 ENCOUNTER — Ambulatory Visit (INDEPENDENT_AMBULATORY_CARE_PROVIDER_SITE_OTHER): Payer: Medicare Other

## 2021-08-28 ENCOUNTER — Other Ambulatory Visit: Payer: Self-pay

## 2021-08-28 VITALS — BP 171/100 | HR 85 | Temp 98.1°F | Resp 16 | Ht 63.0 in | Wt 183.2 lb

## 2021-08-28 DIAGNOSIS — E860 Dehydration: Secondary | ICD-10-CM

## 2021-08-28 NOTE — Progress Notes (Signed)
Diagnosis: Dehydration   Provider:  Marshell Garfinkel, MD  Procedure: Infusion  IV Type: Peripheral, IV Location: R Forearm  Normal Saline, Dose: 1000 ml  Infusion Start Time: 0822  Infusion Stop Time: 1023  Post Infusion IV Care: Peripheral IV Discontinued  Discharge: Condition: Good, Destination: Home . AVS provided to patient.   Performed by:  Neng Albee, Sherlon Handing, LPN

## 2021-09-01 ENCOUNTER — Ambulatory Visit (INDEPENDENT_AMBULATORY_CARE_PROVIDER_SITE_OTHER): Payer: Medicare Other

## 2021-09-01 ENCOUNTER — Other Ambulatory Visit: Payer: Self-pay

## 2021-09-01 VITALS — BP 158/93 | HR 99 | Temp 98.2°F | Resp 18 | Ht 63.0 in | Wt 182.4 lb

## 2021-09-01 DIAGNOSIS — E860 Dehydration: Secondary | ICD-10-CM | POA: Diagnosis not present

## 2021-09-01 NOTE — Progress Notes (Signed)
Diagnosis: Dehydration   Provider:  Marshell Garfinkel, MD  Procedure: Infusion  IV Type: Peripheral, IV Location: R Forearm  Normal Saline, Dose: 1000 ml  Infusion Start Time: 0821  Infusion Stop Time: 1030  Post Infusion IV Care: Peripheral IV Discontinued  Discharge: Condition: Good, Destination: Home . AVS provided to patient.   Performed by:  Barbarita Hutmacher, Sherlon Handing, LPN

## 2021-09-02 ENCOUNTER — Other Ambulatory Visit: Payer: Self-pay | Admitting: Family Medicine

## 2021-09-02 DIAGNOSIS — M79671 Pain in right foot: Secondary | ICD-10-CM

## 2021-09-03 ENCOUNTER — Ambulatory Visit (INDEPENDENT_AMBULATORY_CARE_PROVIDER_SITE_OTHER): Payer: Medicare Other

## 2021-09-03 ENCOUNTER — Other Ambulatory Visit: Payer: Self-pay

## 2021-09-03 VITALS — BP 144/9 | HR 86 | Temp 98.2°F | Resp 16 | Ht 63.0 in | Wt 181.6 lb

## 2021-09-03 DIAGNOSIS — E860 Dehydration: Secondary | ICD-10-CM | POA: Diagnosis not present

## 2021-09-03 NOTE — Progress Notes (Signed)
Diagnosis: dehydration  Provider:  Marshell Garfinkel, MD  Procedure: Infusion  IV Type: Peripheral, IV Location: R Forearm  Normal Saline, Dose: 1000 ml  Infusion Start Time: 0821  Infusion Stop Time: 1030  Post Infusion IV Care: Peripheral IV Discontinued  Discharge: Condition: Good, Destination: Home . AVS provided to patient.   Performed by:  Maryan Sivak, Sherlon Handing, LPN

## 2021-09-08 ENCOUNTER — Other Ambulatory Visit: Payer: Self-pay

## 2021-09-08 ENCOUNTER — Ambulatory Visit (INDEPENDENT_AMBULATORY_CARE_PROVIDER_SITE_OTHER): Payer: Medicare Other

## 2021-09-08 VITALS — BP 147/79 | HR 89 | Temp 98.2°F | Resp 16 | Ht 63.0 in | Wt 184.0 lb

## 2021-09-08 DIAGNOSIS — E860 Dehydration: Secondary | ICD-10-CM

## 2021-09-08 NOTE — Progress Notes (Signed)
Diagnosis: Dehydration  Provider:  Marshell Garfinkel, MD  Procedure: Infusion  IV Type: Peripheral, IV Location: R Forearm  Normal Saline, Dose: 1000 ml  Infusion Start Time: 0823  Infusion Stop Time: 1855  Post Infusion IV Care: Peripheral IV Discontinued  Discharge: Condition: Good, Destination: Home . AVS provided to patient.   Performed by:  Carnelia Oscar, Sherlon Handing, LPN

## 2021-09-09 DIAGNOSIS — Z87891 Personal history of nicotine dependence: Secondary | ICD-10-CM | POA: Diagnosis not present

## 2021-09-09 DIAGNOSIS — R627 Adult failure to thrive: Secondary | ICD-10-CM | POA: Diagnosis not present

## 2021-09-09 DIAGNOSIS — K5010 Crohn's disease of large intestine without complications: Secondary | ICD-10-CM | POA: Diagnosis not present

## 2021-09-09 DIAGNOSIS — G629 Polyneuropathy, unspecified: Secondary | ICD-10-CM | POA: Diagnosis not present

## 2021-09-09 DIAGNOSIS — E1122 Type 2 diabetes mellitus with diabetic chronic kidney disease: Secondary | ICD-10-CM | POA: Diagnosis not present

## 2021-09-09 DIAGNOSIS — M109 Gout, unspecified: Secondary | ICD-10-CM | POA: Diagnosis not present

## 2021-09-09 DIAGNOSIS — D509 Iron deficiency anemia, unspecified: Secondary | ICD-10-CM | POA: Diagnosis not present

## 2021-09-09 DIAGNOSIS — Z833 Family history of diabetes mellitus: Secondary | ICD-10-CM | POA: Diagnosis not present

## 2021-09-09 DIAGNOSIS — N731 Chronic parametritis and pelvic cellulitis: Secondary | ICD-10-CM | POA: Diagnosis not present

## 2021-09-09 DIAGNOSIS — K660 Peritoneal adhesions (postprocedural) (postinfection): Secondary | ICD-10-CM | POA: Diagnosis not present

## 2021-09-09 DIAGNOSIS — Z7952 Long term (current) use of systemic steroids: Secondary | ICD-10-CM | POA: Diagnosis not present

## 2021-09-09 DIAGNOSIS — E785 Hyperlipidemia, unspecified: Secondary | ICD-10-CM | POA: Diagnosis not present

## 2021-09-09 DIAGNOSIS — I129 Hypertensive chronic kidney disease with stage 1 through stage 4 chronic kidney disease, or unspecified chronic kidney disease: Secondary | ICD-10-CM | POA: Diagnosis not present

## 2021-09-09 DIAGNOSIS — K91850 Pouchitis: Secondary | ICD-10-CM | POA: Diagnosis not present

## 2021-09-09 DIAGNOSIS — G2581 Restless legs syndrome: Secondary | ICD-10-CM | POA: Diagnosis not present

## 2021-09-09 DIAGNOSIS — E1169 Type 2 diabetes mellitus with other specified complication: Secondary | ICD-10-CM | POA: Diagnosis not present

## 2021-09-09 DIAGNOSIS — F32A Depression, unspecified: Secondary | ICD-10-CM | POA: Diagnosis not present

## 2021-09-09 DIAGNOSIS — N184 Chronic kidney disease, stage 4 (severe): Secondary | ICD-10-CM | POA: Diagnosis not present

## 2021-09-09 DIAGNOSIS — K91858 Other complications of intestinal pouch: Secondary | ICD-10-CM | POA: Diagnosis not present

## 2021-09-09 DIAGNOSIS — K219 Gastro-esophageal reflux disease without esophagitis: Secondary | ICD-10-CM | POA: Diagnosis not present

## 2021-09-09 DIAGNOSIS — Z86718 Personal history of other venous thrombosis and embolism: Secondary | ICD-10-CM | POA: Diagnosis not present

## 2021-09-10 ENCOUNTER — Ambulatory Visit (INDEPENDENT_AMBULATORY_CARE_PROVIDER_SITE_OTHER): Payer: Medicare Other

## 2021-09-10 ENCOUNTER — Other Ambulatory Visit: Payer: Self-pay

## 2021-09-10 VITALS — BP 165/91 | HR 83 | Temp 98.2°F | Resp 18 | Ht 62.0 in | Wt 184.6 lb

## 2021-09-10 DIAGNOSIS — E860 Dehydration: Secondary | ICD-10-CM | POA: Diagnosis not present

## 2021-09-10 NOTE — Progress Notes (Signed)
Diagnosis: Dehydration  Provider:  Marshell Garfinkel, MD  Procedure: Infusion  IV Type: Peripheral, IV Location: L Antecubital  Normal Saline, Dose: 1000 ml  Infusion Start Time: 9355  Infusion Stop Time: 2174  Post Infusion IV Care: Peripheral IV Discontinued  Discharge: Condition: Good, Destination: Home . AVS provided to patient.   Performed by:  Paul Dykes, RN

## 2021-09-14 DIAGNOSIS — K660 Peritoneal adhesions (postprocedural) (postinfection): Secondary | ICD-10-CM | POA: Diagnosis not present

## 2021-09-14 DIAGNOSIS — K91850 Pouchitis: Secondary | ICD-10-CM | POA: Diagnosis not present

## 2021-09-14 DIAGNOSIS — K219 Gastro-esophageal reflux disease without esophagitis: Secondary | ICD-10-CM | POA: Diagnosis not present

## 2021-09-14 DIAGNOSIS — I129 Hypertensive chronic kidney disease with stage 1 through stage 4 chronic kidney disease, or unspecified chronic kidney disease: Secondary | ICD-10-CM | POA: Diagnosis not present

## 2021-09-14 DIAGNOSIS — N731 Chronic parametritis and pelvic cellulitis: Secondary | ICD-10-CM | POA: Diagnosis not present

## 2021-09-14 DIAGNOSIS — F32A Depression, unspecified: Secondary | ICD-10-CM | POA: Diagnosis not present

## 2021-09-14 DIAGNOSIS — E785 Hyperlipidemia, unspecified: Secondary | ICD-10-CM | POA: Diagnosis not present

## 2021-09-14 DIAGNOSIS — Z7952 Long term (current) use of systemic steroids: Secondary | ICD-10-CM | POA: Diagnosis not present

## 2021-09-14 DIAGNOSIS — K5010 Crohn's disease of large intestine without complications: Secondary | ICD-10-CM | POA: Diagnosis not present

## 2021-09-14 DIAGNOSIS — D509 Iron deficiency anemia, unspecified: Secondary | ICD-10-CM | POA: Diagnosis not present

## 2021-09-14 DIAGNOSIS — G629 Polyneuropathy, unspecified: Secondary | ICD-10-CM | POA: Diagnosis not present

## 2021-09-14 DIAGNOSIS — Z833 Family history of diabetes mellitus: Secondary | ICD-10-CM | POA: Diagnosis not present

## 2021-09-14 DIAGNOSIS — Z466 Encounter for fitting and adjustment of urinary device: Secondary | ICD-10-CM | POA: Diagnosis not present

## 2021-09-14 DIAGNOSIS — G8918 Other acute postprocedural pain: Secondary | ICD-10-CM | POA: Diagnosis not present

## 2021-09-14 DIAGNOSIS — N184 Chronic kidney disease, stage 4 (severe): Secondary | ICD-10-CM | POA: Diagnosis not present

## 2021-09-14 DIAGNOSIS — K50818 Crohn's disease of both small and large intestine with other complication: Secondary | ICD-10-CM | POA: Diagnosis not present

## 2021-09-14 DIAGNOSIS — R627 Adult failure to thrive: Secondary | ICD-10-CM | POA: Diagnosis not present

## 2021-09-14 DIAGNOSIS — K91858 Other complications of intestinal pouch: Secondary | ICD-10-CM | POA: Diagnosis not present

## 2021-09-14 DIAGNOSIS — E1169 Type 2 diabetes mellitus with other specified complication: Secondary | ICD-10-CM | POA: Diagnosis not present

## 2021-09-14 DIAGNOSIS — Z433 Encounter for attention to colostomy: Secondary | ICD-10-CM | POA: Diagnosis not present

## 2021-09-14 DIAGNOSIS — E1122 Type 2 diabetes mellitus with diabetic chronic kidney disease: Secondary | ICD-10-CM | POA: Diagnosis not present

## 2021-09-14 DIAGNOSIS — G2581 Restless legs syndrome: Secondary | ICD-10-CM | POA: Diagnosis not present

## 2021-09-14 DIAGNOSIS — Z87891 Personal history of nicotine dependence: Secondary | ICD-10-CM | POA: Diagnosis not present

## 2021-09-14 DIAGNOSIS — K651 Peritoneal abscess: Secondary | ICD-10-CM | POA: Diagnosis not present

## 2021-09-14 DIAGNOSIS — Z86718 Personal history of other venous thrombosis and embolism: Secondary | ICD-10-CM | POA: Diagnosis not present

## 2021-09-14 DIAGNOSIS — M109 Gout, unspecified: Secondary | ICD-10-CM | POA: Diagnosis not present

## 2021-09-20 ENCOUNTER — Emergency Department (HOSPITAL_COMMUNITY)
Admission: EM | Admit: 2021-09-20 | Discharge: 2021-09-21 | Disposition: A | Discharge status: Home / Self Care | Payer: Medicare Other | Attending: Emergency Medicine | Admitting: Emergency Medicine

## 2021-09-20 ENCOUNTER — Other Ambulatory Visit: Payer: Self-pay

## 2021-09-20 ENCOUNTER — Encounter (HOSPITAL_COMMUNITY): Payer: Self-pay

## 2021-09-20 ENCOUNTER — Emergency Department (HOSPITAL_COMMUNITY): Payer: Medicare Other

## 2021-09-20 DIAGNOSIS — R109 Unspecified abdominal pain: Secondary | ICD-10-CM | POA: Insufficient documentation

## 2021-09-20 DIAGNOSIS — R300 Dysuria: Secondary | ICD-10-CM | POA: Diagnosis not present

## 2021-09-20 DIAGNOSIS — Z90710 Acquired absence of both cervix and uterus: Secondary | ICD-10-CM | POA: Diagnosis not present

## 2021-09-20 DIAGNOSIS — Z9049 Acquired absence of other specified parts of digestive tract: Secondary | ICD-10-CM | POA: Diagnosis not present

## 2021-09-20 DIAGNOSIS — N319 Neuromuscular dysfunction of bladder, unspecified: Secondary | ICD-10-CM | POA: Diagnosis not present

## 2021-09-20 DIAGNOSIS — E876 Hypokalemia: Secondary | ICD-10-CM | POA: Diagnosis not present

## 2021-09-20 LAB — URINALYSIS, ROUTINE W REFLEX MICROSCOPIC
Bacteria, UA: NONE SEEN
Bilirubin Urine: NEGATIVE
Glucose, UA: NEGATIVE mg/dL
Hgb urine dipstick: NEGATIVE
Ketones, ur: NEGATIVE mg/dL
Nitrite: NEGATIVE
Protein, ur: 100 mg/dL — AB
Specific Gravity, Urine: 1.018 (ref 1.005–1.030)
pH: 5 (ref 5.0–8.0)

## 2021-09-20 LAB — CBC WITH DIFFERENTIAL/PLATELET
Abs Immature Granulocytes: 0.12 10*3/uL — ABNORMAL HIGH (ref 0.00–0.07)
Basophils Absolute: 0 10*3/uL (ref 0.0–0.1)
Basophils Relative: 0 %
Eosinophils Absolute: 0.5 10*3/uL (ref 0.0–0.5)
Eosinophils Relative: 4 %
HCT: 27.2 % — ABNORMAL LOW (ref 36.0–46.0)
Hemoglobin: 8.6 g/dL — ABNORMAL LOW (ref 12.0–15.0)
Immature Granulocytes: 1 %
Lymphocytes Relative: 18 %
Lymphs Abs: 2.3 10*3/uL (ref 0.7–4.0)
MCH: 33 pg (ref 26.0–34.0)
MCHC: 31.6 g/dL (ref 30.0–36.0)
MCV: 104.2 fL — ABNORMAL HIGH (ref 80.0–100.0)
Monocytes Absolute: 0.8 10*3/uL (ref 0.1–1.0)
Monocytes Relative: 6 %
Neutro Abs: 9 10*3/uL — ABNORMAL HIGH (ref 1.7–7.7)
Neutrophils Relative %: 71 %
Platelets: 502 10*3/uL — ABNORMAL HIGH (ref 150–400)
RBC: 2.61 MIL/uL — ABNORMAL LOW (ref 3.87–5.11)
RDW: 16 % — ABNORMAL HIGH (ref 11.5–15.5)
WBC: 12.7 10*3/uL — ABNORMAL HIGH (ref 4.0–10.5)
nRBC: 0.5 % — ABNORMAL HIGH (ref 0.0–0.2)

## 2021-09-20 LAB — COMPREHENSIVE METABOLIC PANEL
ALT: 30 U/L (ref 0–44)
AST: 35 U/L (ref 15–41)
Albumin: 4.1 g/dL (ref 3.5–5.0)
Alkaline Phosphatase: 143 U/L — ABNORMAL HIGH (ref 38–126)
Anion gap: 14 (ref 5–15)
BUN: 33 mg/dL — ABNORMAL HIGH (ref 8–23)
CO2: 23 mmol/L (ref 22–32)
Calcium: 9.6 mg/dL (ref 8.9–10.3)
Chloride: 98 mmol/L (ref 98–111)
Creatinine, Ser: 2.55 mg/dL — ABNORMAL HIGH (ref 0.44–1.00)
GFR, Estimated: 20 mL/min — ABNORMAL LOW (ref >60)
Glucose, Bld: 116 mg/dL — ABNORMAL HIGH (ref 70–99)
Potassium: 2.8 mmol/L — ABNORMAL LOW (ref 3.5–5.1)
Sodium: 135 mmol/L (ref 135–145)
Total Bilirubin: 0.7 mg/dL (ref 0.3–1.2)
Total Protein: 8.6 g/dL — ABNORMAL HIGH (ref 6.5–8.1)

## 2021-09-20 IMAGING — CT CT RENAL STONE PROTOCOL
2 of 4 series · 16 of 46 positions shown, 18 images · non-contrast
Comparison: [DATE].

CLINICAL DATA: Neurogenic bladder dysfunction.  Recent ileostomy.



[Series 2: axial st · axial · 0.81mm/px · z∈[-468,-58]mm · 13 of 92 slices shown, 15 images]
[im 5/92  soft-tissue]
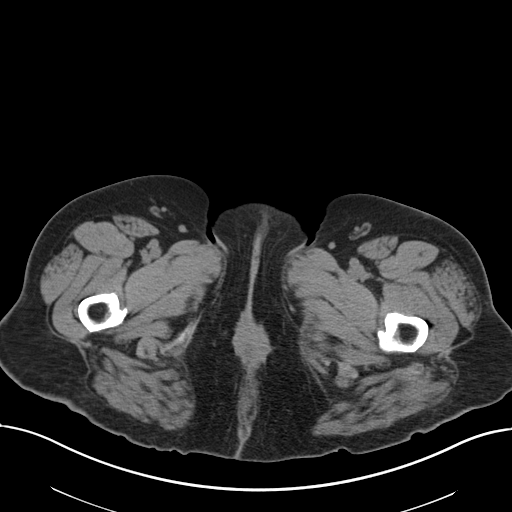
[im 5/92  bone]
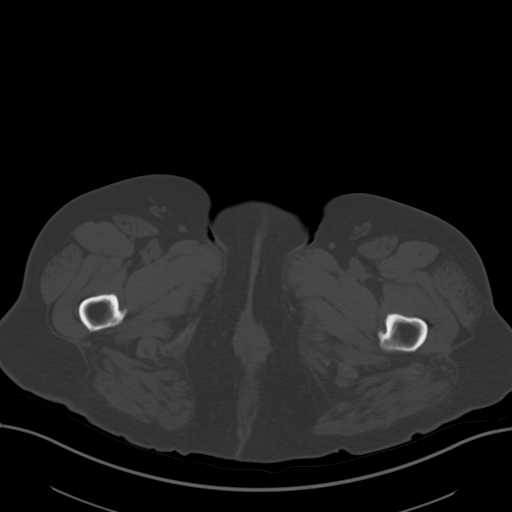
[im 15/92  soft-tissue]
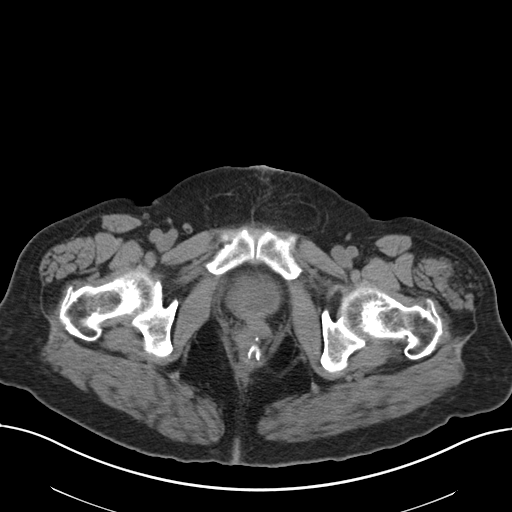
[im 20/92  soft-tissue]
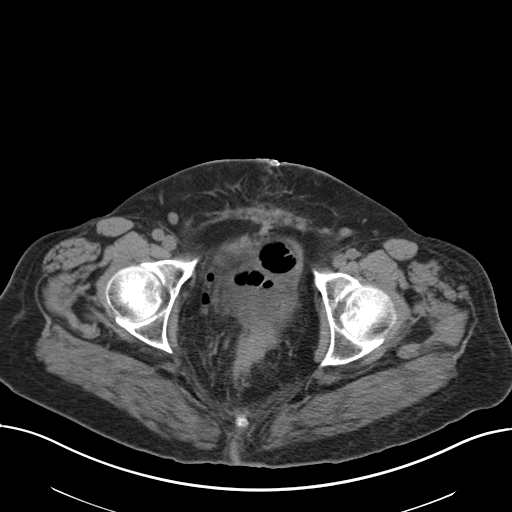
[im 24/92  soft-tissue]
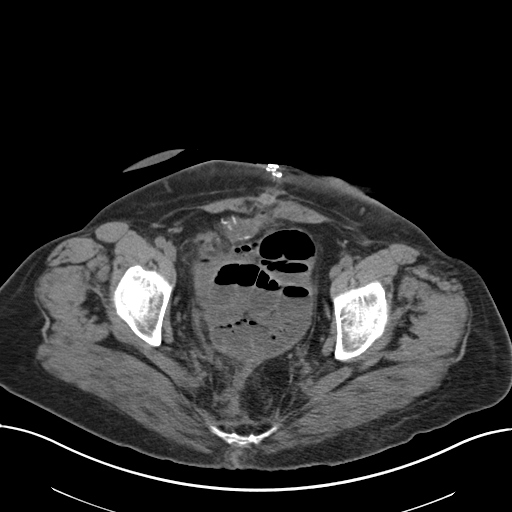
[im 34/92  soft-tissue]
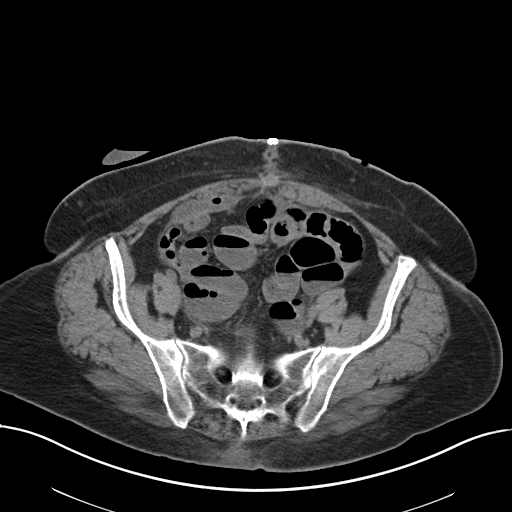
[im 39/92  soft-tissue]
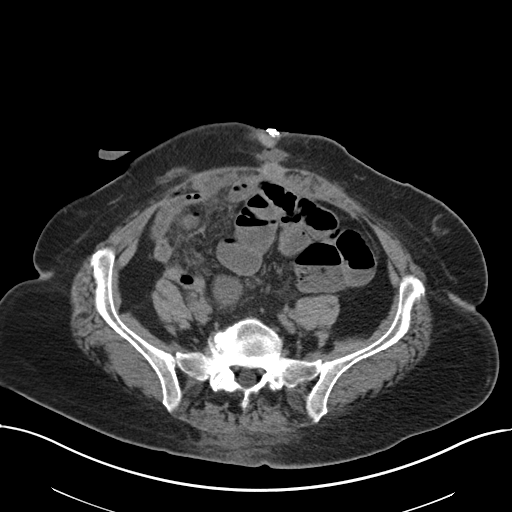
[im 48/92  soft-tissue]
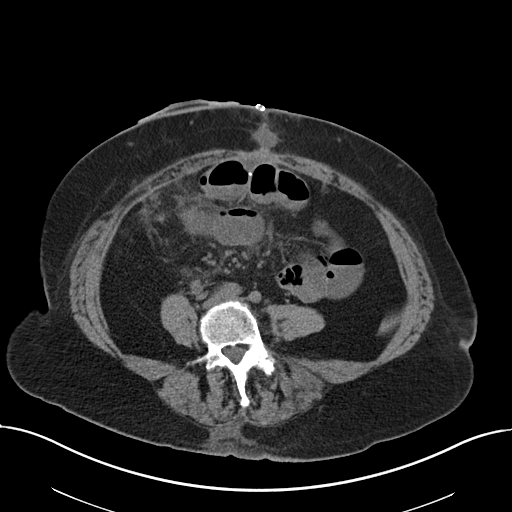
[im 53/92  soft-tissue]
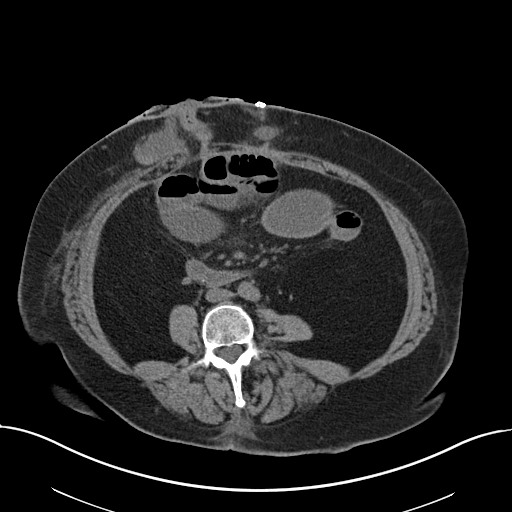
[im 58/92  soft-tissue]
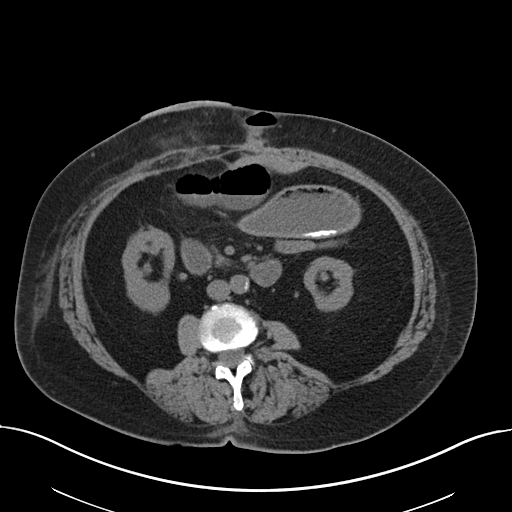
[im 58/92  bone]
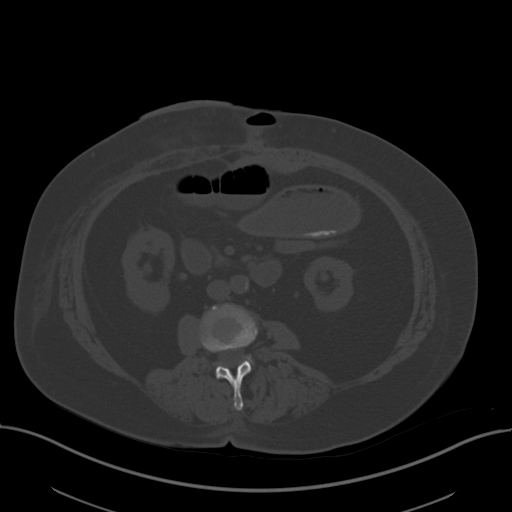
[im 68/92  soft-tissue]
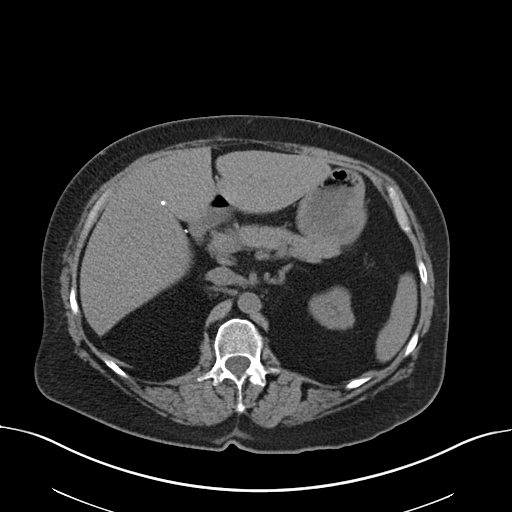
[im 72/92  soft-tissue]
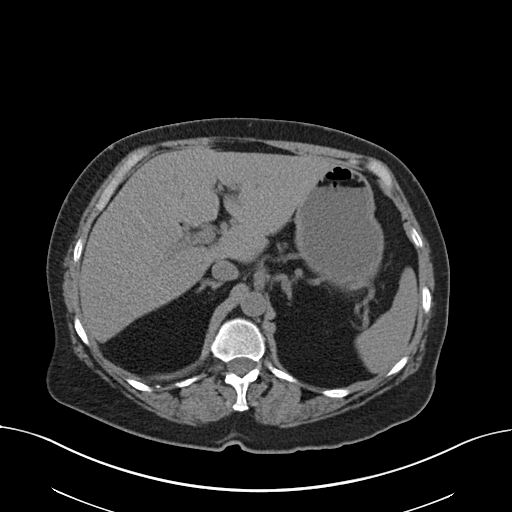
[im 77/92  soft-tissue]
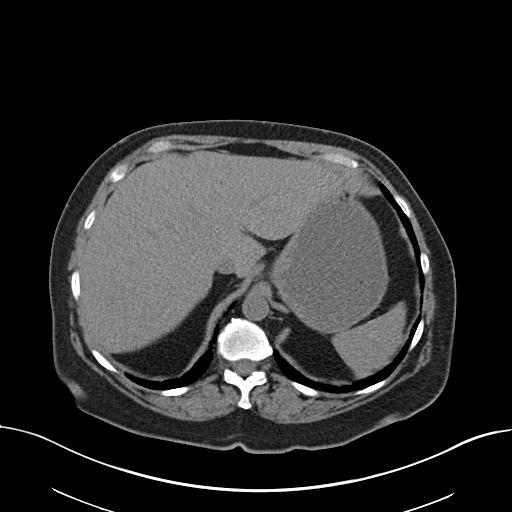
[im 87/92  soft-tissue]
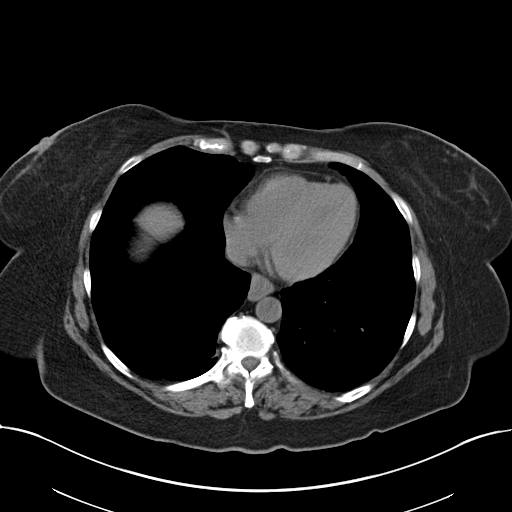

[Series 4: coronal · coronal · 0.72mm/px · 3 of 151 slices shown]
[im 51/151  soft-tissue]
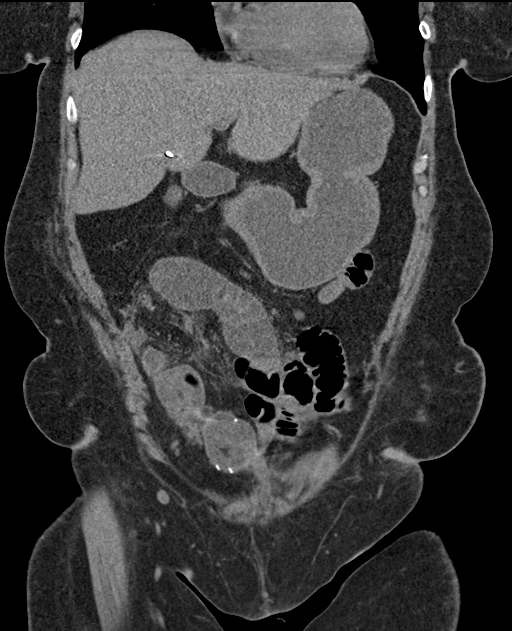
[im 67/151  soft-tissue]
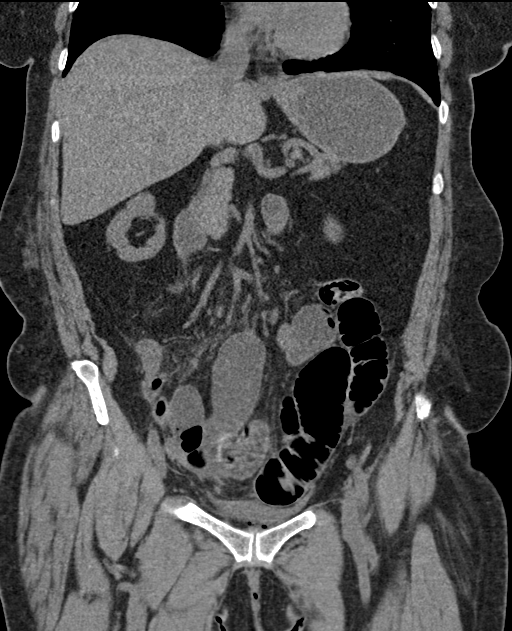
[im 84/151  soft-tissue]
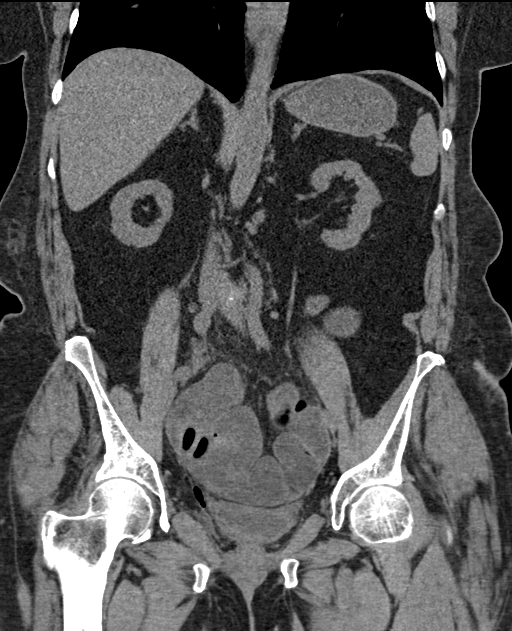

[16 of 46 positions shown; findings below may reference images not displayed]

FINDINGS: LOWER CHEST: Normal.

HEPATOBILIARY: Normal hepatic contours. No intra- or extrahepatic
biliary dilatation. Status post cholecystectomy.

PANCREAS: Normal pancreas. No ductal dilatation or peripancreatic
fluid collection.

SPLEEN: Normal.

ADRENALS/URINARY TRACT: The adrenal glands are normal. No
hydronephrosis, nephroureterolithiasis or solid renal mass. The
urinary bladder is normal for degree of distention

STOMACH/BOWEL: Status post colectomy with right lower quadrant
ileostomy. Recent ileostomy revision with small amount of
intraperitoneal air in the right pelvis. Small amount of gas and
fluid deep to the midline incision site. Mild fat stranding in the
lower right mid abdomen.

VASCULAR/LYMPHATIC: Normal course and caliber of the major abdominal
vessels. No abdominal or pelvic lymphadenopathy.

REPRODUCTIVE: Status post hysterectomy. No adnexal mass.

MUSCULOSKELETAL. No bony spinal canal stenosis or focal osseous
abnormality.

OTHER: None.
IMPRESSION: 1. Recent ileostomy revision with small amount of intraperitoneal
air in the right pelvis and small amount of gas and fluid deep to
the midline incision site. These findings are suspected to be
postoperative.

## 2021-09-20 MED ORDER — POTASSIUM CHLORIDE CRYS ER 20 MEQ PO TBCR
40.0000 meq | EXTENDED_RELEASE_TABLET | Freq: Once | ORAL | Status: AC
  Administered 2021-09-20: 40 meq via ORAL
  Filled 2021-09-20 (×2): qty 2

## 2021-09-20 MED ORDER — SODIUM CHLORIDE 0.9 % IV BOLUS (SEPSIS)
1000.0000 mL | Freq: Once | INTRAVENOUS | Status: AC
  Administered 2021-09-20: 1000 mL via INTRAVENOUS

## 2021-09-20 MED ORDER — POTASSIUM CHLORIDE CRYS ER 20 MEQ PO TBCR: 20.0000 meq | EXTENDED_RELEASE_TABLET | Freq: Two times a day (BID) | ORAL | 0 refills | Status: DC

## 2021-09-20 MED ORDER — OXYBUTYNIN CHLORIDE 5 MG PO TABS: 5.0000 mg | ORAL_TABLET | Freq: Three times a day (TID) | ORAL | 0 refills | Status: DC | PRN

## 2021-09-20 MED ORDER — SODIUM CHLORIDE 0.9 % IV SOLN
1000.0000 mL | INTRAVENOUS | Status: DC
  Administered 2021-09-20: 1000 mL via INTRAVENOUS

## 2021-09-20 MED ORDER — OXYBUTYNIN CHLORIDE 5 MG PO TABS
5.0000 mg | ORAL_TABLET | Freq: Once | ORAL | Status: AC
  Administered 2021-09-20: 5 mg via ORAL
  Filled 2021-09-20: qty 1

## 2021-09-20 MED ORDER — POTASSIUM CHLORIDE 10 MEQ/100ML IV SOLN
10.0000 meq | Freq: Once | INTRAVENOUS | Status: AC
  Administered 2021-09-20: 10 meq via INTRAVENOUS
  Filled 2021-09-20: qty 100

## 2021-09-20 NOTE — ED Notes (Signed)
Requisition tubed to lab for urine culture

## 2021-09-20 NOTE — ED Provider Notes (Signed)
[facility] DEPT Provider Note   CSN: [id number] Arrival date & time: 09/20/21  1908     History  Chief Complaint  Patient presents with   Dehydration   UTI    [name] is a 66 y.o. female.  HPI  Patient has a history of small bowel obstruction, hypokalemia, hyperparathyroidism, ulcerative colitis and anemia who presents with complaints of dysuria and feeling dehydrated.  Patient states she had surgery at [facility] last week.  The surgery was on January 9.  Patient had ileoanal pouch excision with end ileostomy.  Patient states since leaving the hospital she felt as if she might have a UTI.  She has had discomfort with urination.  She has to go and has a small amount of urine each time.  Patient also states she feels like she might be getting dehydrated.  She has not had any problems with vomiting.  She does have liquid stools through her ileostomy.  Patient denies any fevers.  Home Medications Prior to Admission medications   Medication Sig Start Date End Date Taking? Authorizing Provider  acetaminophen (TYLENOL) 500 MG tablet Take 500-1,000 mg by mouth at bedtime.    [provider]  allopurinol (ZYLOPRIM) 300 MG tablet TAKE 1 TABLET BY MOUTH EVERY DAY 08/17/21   Carollee Herter, Alferd Apa, DO  Calcium Carbonate Antacid (TUMS EXTRA STRENGTH 750 PO) Take 1-2 tablets by mouth See admin instructions. Chew 1-2 tablets by mouth as needed for heartburn or reflux    [provider]  DULoxetine (CYMBALTA) 60 MG capsule Take 2 capsules (120 mg total) by mouth daily. Patient taking differently: Take 120 mg by mouth at bedtime. 11/05/20   Carollee Herter, Yvonne R, DO  ENTYVIO 300 MG injection Inject 300 mg into the vein every 28 (twenty-eight) days.    [provider]  famotidine (PEPCID) 20 MG tablet TAKE 1 TABLET BY MOUTH TWICE A DAY Patient taking differently: Take 20 mg by mouth daily as needed for heartburn or  indigestion. 04/01/21   Armbruster, Carlota Raspberry, MD  folic acid (FOLVITE) 1 MG tablet Take 2 tablets (2 mg total) by mouth daily. Patient taking differently: Take 1-2 mg by mouth daily. 05/26/17   [name], MD  gabapentin (NEURONTIN) 100 MG capsule TAKE 1 CAPSULE BY MOUTH THREE TIMES A DAY 09/02/21   Carollee Herter, Alferd Apa, DO  metoprolol succinate (TOPROL-XL) 100 MG 24 hr tablet Take 50-100 mg by mouth See admin instructions. Take 100 mg by mouth in the morning and 50 mg at bedtime    [provider]  ondansetron (ZOFRAN) 4 MG tablet Take 1 tablet (4 mg total) by mouth every 6 (six) hours as needed for up to 20 doses for nausea. 05/16/21   Antonieta Pert, MD  oxyCODONE (OXY IR/ROXICODONE) 5 MG immediate release tablet Take 5 mg by mouth at bedtime as needed for moderate pain. Patient not taking: Reported on 05/26/2021 04/27/21   [provider]  Vitamin D, Ergocalciferol, (DRISDOL) 1.25 MG (50000 UNIT) CAPS capsule Take 50,000 Units by mouth every 30 (thirty) days.    [provider]      Allergies    Sulfa antibiotics, Morphine, and Sulfonamide derivatives    Review of Systems   Review of Systems  Constitutional:  Negative for fever.   Physical Exam Updated Vital Signs BP (!) 148/90    Pulse 96    Temp 97.9 F (36.6 C) (Oral)  Resp 18    Ht 1.575 m (5' 2" )    Wt 83.9 kg    SpO2 100%    BMI 33.84 kg/m  Physical Exam Vitals and nursing note reviewed.  Constitutional:      General: She is not in acute distress.    Appearance: She is well-developed.  HENT:     Head: Normocephalic and atraumatic.     Right Ear: External ear normal.     Left Ear: External ear normal.  Eyes:     General: No scleral icterus.       Right eye: No discharge.        Left eye: No discharge.     Conjunctiva/sclera: Conjunctivae normal.  Neck:     Trachea: No tracheal deviation.  Cardiovascular:     Rate and Rhythm: Normal rate and regular rhythm.  Pulmonary:     Effort: Pulmonary  effort is normal. No respiratory distress.     Breath sounds: Normal breath sounds. No stridor. No wheezing or rales.  Abdominal:     General: Bowel sounds are normal. There is no distension.     Palpations: Abdomen is soft.     Tenderness: There is abdominal tenderness. There is no guarding or rebound.     Comments: Mild tenderness palpation suprapubic region and appropriate tenderness overlying abdominal wound, no erythema, ileostomy in place  Musculoskeletal:        General: No tenderness or deformity.     Cervical back: Neck supple.  Skin:    General: Skin is warm and dry.     Findings: No rash.  Neurological:     General: No focal deficit present.     Mental Status: She is alert.     Cranial Nerves: No cranial nerve deficit (no facial droop, extraocular movements intact, no slurred speech).     Sensory: No sensory deficit.     Motor: No abnormal muscle tone or seizure activity.     Coordination: Coordination normal.  Psychiatric:        Mood and Affect: Mood normal.    ED Results / Procedures / Treatments   Labs (all labs ordered are listed, but only abnormal results are displayed) Labs Reviewed  URINALYSIS, ROUTINE W REFLEX MICROSCOPIC - Abnormal; Notable for the following components:      Result Value   APPearance HAZY (*)    Protein, ur 100 (*)    Leukocytes,Ua TRACE (*)    All other components within normal limits  CBC WITH DIFFERENTIAL/PLATELET - Abnormal; Notable for the following components:   WBC 12.7 (*)    RBC 2.61 (*)    Hemoglobin 8.6 (*)    HCT 27.2 (*)    MCV 104.2 (*)    RDW 16.0 (*)    Platelets 502 (*)    nRBC 0.5 (*)    Neutro Abs 9.0 (*)    Abs Immature Granulocytes 0.12 (*)    All other components within normal limits  COMPREHENSIVE METABOLIC PANEL - Abnormal; Notable for the following components:   Potassium 2.8 (*)    Glucose, Bld 116 (*)    BUN 33 (*)    Creatinine, Ser 2.55 (*)    Total Protein 8.6 (*)    Alkaline Phosphatase 143 (*)     GFR, Estimated 20 (*)    All other components within normal limits  URINE CULTURE    EKG None  Radiology CT Renal Stone Study  Result Date: 09/20/2021 CLINICAL DATA:  Neurogenic bladder dysfunction.  Recent ileostomy. EXAM: CT ABDOMEN AND PELVIS WITHOUT CONTRAST TECHNIQUE: Multidetector CT imaging of the abdomen and pelvis was performed following the standard protocol without IV contrast. RADIATION DOSE REDUCTION: This exam was performed according to the departmental dose-optimization program which includes automated exposure control, adjustment of the mA and/or kV according to patient size and/or use of iterative reconstruction technique. COMPARISON:  05/26/2021. FINDINGS: LOWER CHEST: Normal. HEPATOBILIARY: Normal hepatic contours. No intra- or extrahepatic biliary dilatation. Status post cholecystectomy. PANCREAS: Normal pancreas. No ductal dilatation or peripancreatic fluid collection. SPLEEN: Normal. ADRENALS/URINARY TRACT: The adrenal glands are normal. No hydronephrosis, nephroureterolithiasis or solid renal mass. The urinary bladder is normal for degree of distention STOMACH/BOWEL: Status post colectomy with right lower quadrant ileostomy. Recent ileostomy revision with small amount of intraperitoneal air in the right pelvis. Small amount of gas and fluid deep to the midline incision site. Mild fat stranding in the lower right mid abdomen. VASCULAR/LYMPHATIC: Normal course and caliber of the major abdominal vessels. No abdominal or pelvic lymphadenopathy. REPRODUCTIVE: Status post hysterectomy. No adnexal mass. MUSCULOSKELETAL. No bony spinal canal stenosis or focal osseous abnormality. OTHER: None. IMPRESSION: 1. Recent ileostomy revision with small amount of intraperitoneal air in the right pelvis and small amount of gas and fluid deep to the midline incision site. These findings are suspected to be postoperative. Electronically Signed   By: Ulyses Jarred M.D.   On: 09/20/2021 21:54     Procedures Procedures    Medications Ordered in ED Medications  sodium chloride 0.9 % bolus 1,000 mL (1,000 mLs Intravenous New Bag/Given 09/20/21 2207)    Followed by  0.9 %  sodium chloride infusion (1,000 mLs Intravenous New Bag/Given 09/20/21 2208)  potassium chloride 10 mEq in 100 mL IVPB (10 mEq Intravenous New Bag/Given 09/20/21 2209)  oxybutynin (DITROPAN) tablet 5 mg (has no administration in time range)  potassium chloride SA (KLOR-CON M) CR tablet 40 mEq (40 mEq Oral Given 09/20/21 2213)    ED Course/ Medical Decision Making/ A&P Clinical Course as of 09/20/21 2248  Sun Sep 20, 2021  2107 Urinalysis does not suggest urinary tract infection.  CBC shows stable anemia.  Metabolic panel shows chronic kidney disease but creatinine appears stable.  No signs of severe dehydration.  Patient does have hypokalemia.  Will give a dose of potassium. [JK]  2107 With the patient's complaints of dysuria we will proceed with CT scan to rule out bladder outlet obstruction.  Patient's dressing and surgical wound precludes abdominal ultrasound [JK]  2107 Outside records reviewed from [facility].  Patient's creatinine on January 10 was 2.75 [JK]  2107 Patient's hemoglobin on January 10 at [facility] was 8.4 [JK]  2210 IMPRESSION:  radiology report, images reviewed 1. Recent ileostomy revision with small amount of intraperitoneal air in the right pelvis and small amount of gas and fluid deep to the midline incision site. These findings are suspected to be postoperative.   [JK]    Clinical Course User Index [JK] Dorie Rank, MD                           Medical Decision Making  Patient presented to ED with complaints of dysuria and feeling dehydrated.  Patient had recent surgery on Monday.  She was released from the hospital on Friday.  Patient states she has been having urinary discomfort.  Patient's urinalysis does not suggest infection.  Patient also had a CT scan to rule out any  signs  of bladder outlet obstruction or ureterolithiasis.  No acute findings noted.  There was a small amount of gas noted but most likely felt to be postoperative in nature.  I suspect the patient's complaints of dysuria is related to bladder manipulation and bladder spasms.  We will try her on a course of Ditropan.  We will send off urine culture to rule out infection.  Patient without signs of severe dehydration.  She does have chronic kidney disease but her labs appear to be at her baseline.  She also does not show signs of acute infection or anemia that is new compared to her most recent values.  Patient is hypokalemic but she was administered IV and oral potassium.  We will plan on discharge home with supplement.  At this time no indications for hospitalization        Final Clinical Impression(s) / ED Diagnoses Final diagnoses:  Hypokalemia  Dysuria     Dorie Rank, MD 09/20/21 2248

## 2021-09-20 NOTE — ED Notes (Signed)
Patient waiting for her daughter for discharge home

## 2021-09-20 NOTE — ED Triage Notes (Signed)
Pt reports with dehydration and abdominal pain and states that she has a UTI. Pt had a new ileostomy placed on Monday, was released Friday, and states that she has been sick ever since. Pt reports having a foley in place during her hospital stay.

## 2021-09-20 NOTE — Discharge Instructions (Addendum)
Take the medications as prescribed.  Follow-up with your surgeons to be rechecked

## 2021-09-22 ENCOUNTER — Encounter (HOSPITAL_COMMUNITY): Payer: Self-pay

## 2021-09-22 ENCOUNTER — Other Ambulatory Visit: Payer: Self-pay

## 2021-09-22 ENCOUNTER — Emergency Department (HOSPITAL_COMMUNITY): Payer: Medicare Other

## 2021-09-22 ENCOUNTER — Emergency Department (HOSPITAL_COMMUNITY)
Admission: EM | Admit: 2021-09-22 | Discharge: 2021-09-22 | Disposition: A | Discharge status: Other Acute Inpatient Hospital | Payer: Medicare Other | Attending: Emergency Medicine | Admitting: Emergency Medicine

## 2021-09-22 DIAGNOSIS — K5100 Ulcerative (chronic) pancolitis without complications: Secondary | ICD-10-CM | POA: Diagnosis not present

## 2021-09-22 DIAGNOSIS — Z20822 Contact with and (suspected) exposure to covid-19: Secondary | ICD-10-CM | POA: Insufficient documentation

## 2021-09-22 DIAGNOSIS — Z833 Family history of diabetes mellitus: Secondary | ICD-10-CM | POA: Diagnosis not present

## 2021-09-22 DIAGNOSIS — R000 Tachycardia, unspecified: Secondary | ICD-10-CM | POA: Insufficient documentation

## 2021-09-22 DIAGNOSIS — K56699 Other intestinal obstruction unspecified as to partial versus complete obstruction: Secondary | ICD-10-CM | POA: Diagnosis not present

## 2021-09-22 DIAGNOSIS — K5190 Ulcerative colitis, unspecified, without complications: Secondary | ICD-10-CM | POA: Diagnosis not present

## 2021-09-22 DIAGNOSIS — Z79899 Other long term (current) drug therapy: Secondary | ICD-10-CM | POA: Diagnosis not present

## 2021-09-22 DIAGNOSIS — Z9049 Acquired absence of other specified parts of digestive tract: Secondary | ICD-10-CM | POA: Diagnosis not present

## 2021-09-22 DIAGNOSIS — D638 Anemia in other chronic diseases classified elsewhere: Secondary | ICD-10-CM | POA: Diagnosis not present

## 2021-09-22 DIAGNOSIS — K6389 Other specified diseases of intestine: Secondary | ICD-10-CM | POA: Diagnosis not present

## 2021-09-22 DIAGNOSIS — K2970 Gastritis, unspecified, without bleeding: Secondary | ICD-10-CM | POA: Diagnosis not present

## 2021-09-22 DIAGNOSIS — K567 Ileus, unspecified: Secondary | ICD-10-CM | POA: Diagnosis not present

## 2021-09-22 DIAGNOSIS — K56609 Unspecified intestinal obstruction, unspecified as to partial versus complete obstruction: Secondary | ICD-10-CM | POA: Diagnosis not present

## 2021-09-22 DIAGNOSIS — Z882 Allergy status to sulfonamides status: Secondary | ICD-10-CM | POA: Diagnosis not present

## 2021-09-22 DIAGNOSIS — R1110 Vomiting, unspecified: Secondary | ICD-10-CM | POA: Diagnosis not present

## 2021-09-22 DIAGNOSIS — K50119 Crohn's disease of large intestine with unspecified complications: Secondary | ICD-10-CM | POA: Diagnosis not present

## 2021-09-22 DIAGNOSIS — I129 Hypertensive chronic kidney disease with stage 1 through stage 4 chronic kidney disease, or unspecified chronic kidney disease: Secondary | ICD-10-CM | POA: Diagnosis not present

## 2021-09-22 DIAGNOSIS — R112 Nausea with vomiting, unspecified: Secondary | ICD-10-CM | POA: Diagnosis not present

## 2021-09-22 DIAGNOSIS — N184 Chronic kidney disease, stage 4 (severe): Secondary | ICD-10-CM | POA: Diagnosis not present

## 2021-09-22 DIAGNOSIS — Z90710 Acquired absence of both cervix and uterus: Secondary | ICD-10-CM | POA: Diagnosis not present

## 2021-09-22 DIAGNOSIS — G4700 Insomnia, unspecified: Secondary | ICD-10-CM | POA: Diagnosis not present

## 2021-09-22 DIAGNOSIS — N200 Calculus of kidney: Secondary | ICD-10-CM | POA: Insufficient documentation

## 2021-09-22 DIAGNOSIS — R1030 Lower abdominal pain, unspecified: Secondary | ICD-10-CM | POA: Diagnosis present

## 2021-09-22 LAB — URINALYSIS, ROUTINE W REFLEX MICROSCOPIC
Bacteria, UA: NONE SEEN
Bilirubin Urine: NEGATIVE
Glucose, UA: NEGATIVE mg/dL
Hgb urine dipstick: NEGATIVE
Ketones, ur: 5 mg/dL — AB
Nitrite: NEGATIVE
Protein, ur: >=300 mg/dL — AB
Specific Gravity, Urine: 1.016 (ref 1.005–1.030)
pH: 5 (ref 5.0–8.0)

## 2021-09-22 LAB — COMPREHENSIVE METABOLIC PANEL
ALT: 23 U/L (ref 0–44)
AST: 23 U/L (ref 15–41)
Albumin: 3.5 g/dL (ref 3.5–5.0)
Alkaline Phosphatase: 113 U/L (ref 38–126)
Anion gap: 13 (ref 5–15)
BUN: 37 mg/dL — ABNORMAL HIGH (ref 8–23)
CO2: 25 mmol/L (ref 22–32)
Calcium: 9.9 mg/dL (ref 8.9–10.3)
Chloride: 96 mmol/L — ABNORMAL LOW (ref 98–111)
Creatinine, Ser: 2.6 mg/dL — ABNORMAL HIGH (ref 0.44–1.00)
GFR, Estimated: 20 mL/min — ABNORMAL LOW (ref >60)
Glucose, Bld: 136 mg/dL — ABNORMAL HIGH (ref 70–99)
Potassium: 3.8 mmol/L (ref 3.5–5.1)
Sodium: 134 mmol/L — ABNORMAL LOW (ref 135–145)
Total Bilirubin: 0.8 mg/dL (ref 0.3–1.2)
Total Protein: 7.7 g/dL (ref 6.5–8.1)

## 2021-09-22 LAB — CBC
HCT: 24.3 % — ABNORMAL LOW (ref 36.0–46.0)
Hemoglobin: 7.7 g/dL — ABNORMAL LOW (ref 12.0–15.0)
MCH: 33.3 pg (ref 26.0–34.0)
MCHC: 31.7 g/dL (ref 30.0–36.0)
MCV: 105.2 fL — ABNORMAL HIGH (ref 80.0–100.0)
Platelets: 543 10*3/uL — ABNORMAL HIGH (ref 150–400)
RBC: 2.31 MIL/uL — ABNORMAL LOW (ref 3.87–5.11)
RDW: 16.1 % — ABNORMAL HIGH (ref 11.5–15.5)
WBC: 16.4 10*3/uL — ABNORMAL HIGH (ref 4.0–10.5)
nRBC: 0.2 % (ref 0.0–0.2)

## 2021-09-22 LAB — LACTIC ACID, PLASMA
Lactic Acid, Venous: 1.8 mmol/L (ref 0.5–1.9)
Lactic Acid, Venous: 2.1 mmol/L (ref 0.5–1.9)

## 2021-09-22 LAB — MAGNESIUM: Magnesium: 1.4 mg/dL — ABNORMAL LOW (ref 1.7–2.4)

## 2021-09-22 LAB — RESP PANEL BY RT-PCR (FLU A&B, COVID) ARPGX2
Influenza A by PCR: NEGATIVE
Influenza B by PCR: NEGATIVE
SARS Coronavirus 2 by RT PCR: NEGATIVE

## 2021-09-22 LAB — LIPASE, BLOOD: Lipase: 37 U/L (ref 11–51)

## 2021-09-22 IMAGING — CR DG ABDOMEN ACUTE W/ 1V CHEST
4 series · 4 of 4 positions shown · non-contrast
Comparison: Abdominal series [DATE]. CT abdomen and pelvis
[DATE].

CLINICAL DATA: Vomiting, recent ileostomy.  Ulcerative colitis.

EXAM:
DG ABDOMEN ACUTE WITH 1 VIEW CHEST

[w abdomen upright]
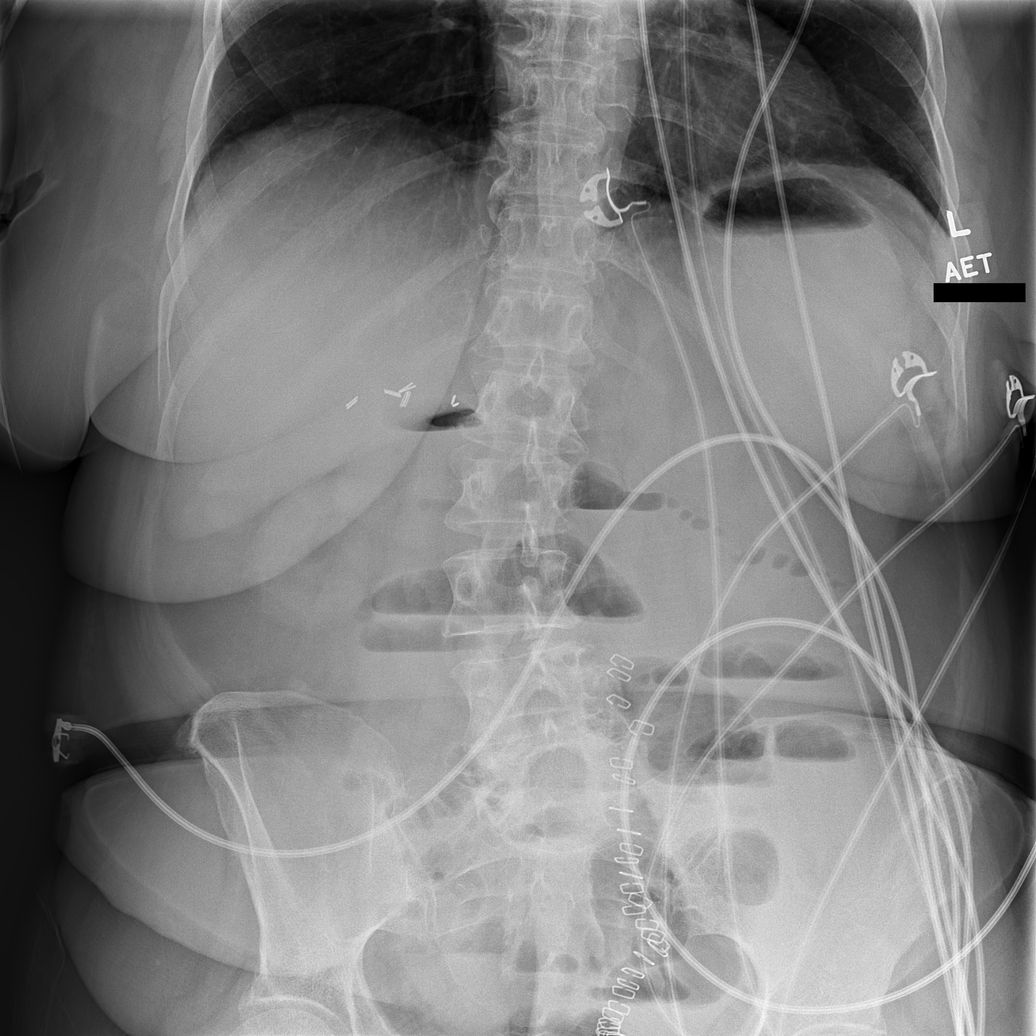

[w chest pa]
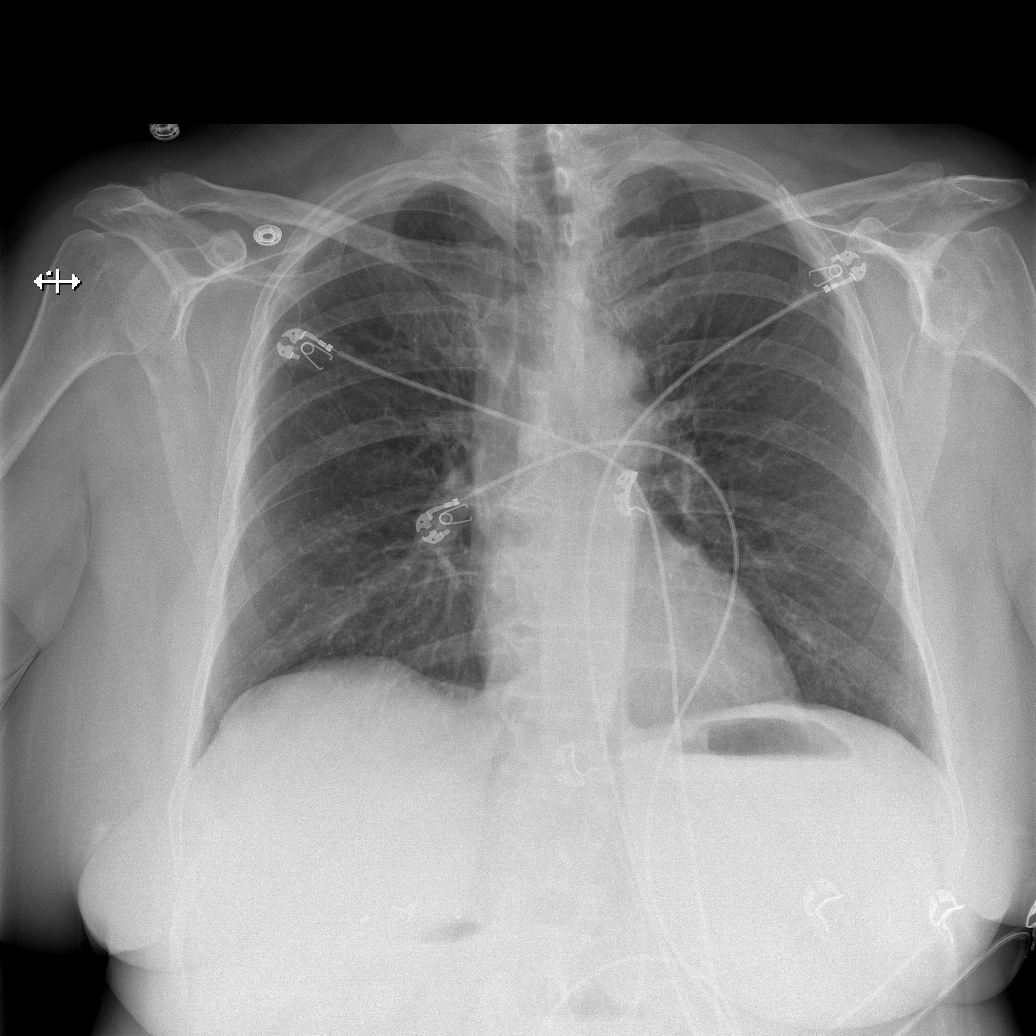

[t abdomen supine (1 of 2)]
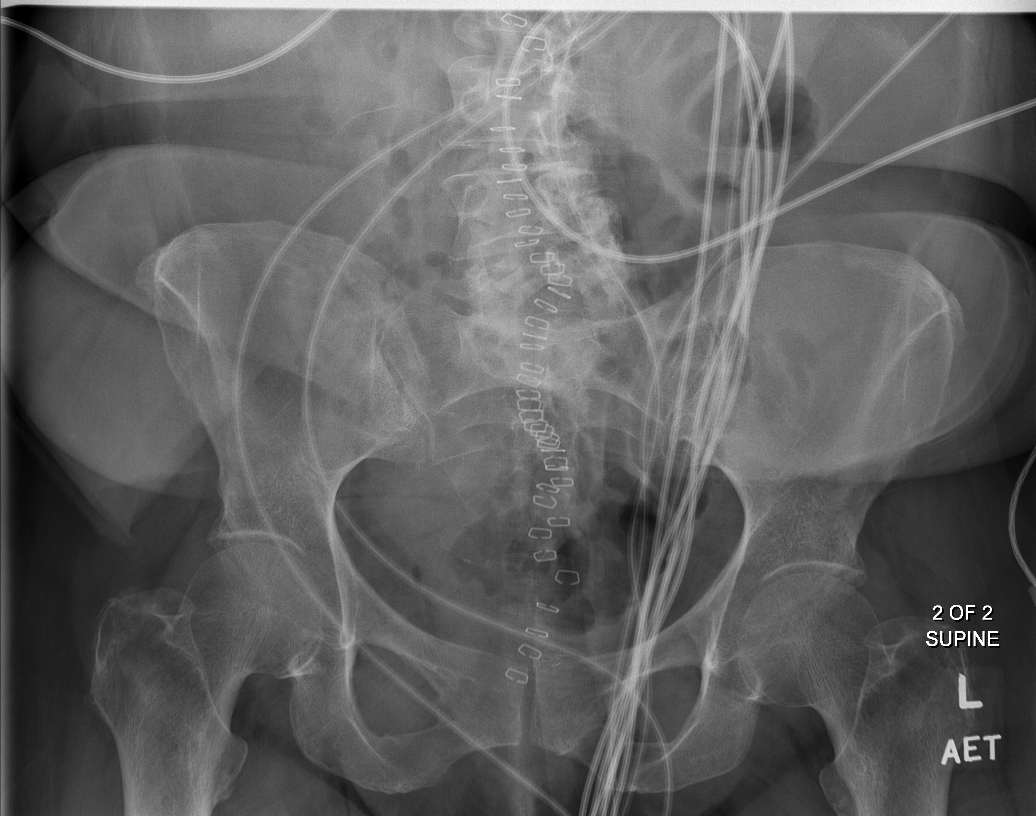

[t abdomen supine (2 of 2)]
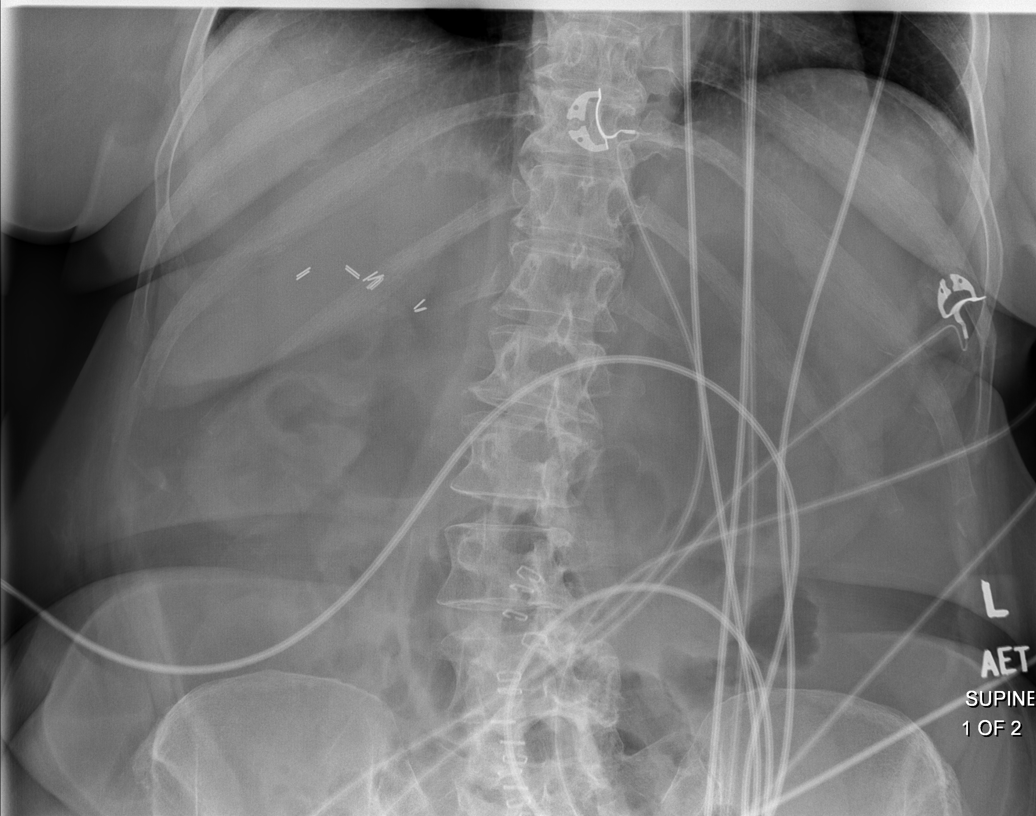

[4 of 4 positions shown; findings below may reference images not displayed]

FINDINGS: The lungs are clear. There is no pleural effusion or pneumothorax.
Cardiomediastinal silhouette is within normal limits. Again seen are
midline abdominal skin staples. Cholecystectomy clips are present.
Dilated small bowel loops with air-fluid levels are again seen
measuring up to 4.4 cm similar to the prior study. There is also an
air-fluid level in the stomach. No definite free air under the
diaphragm. No acute fractures are seen. There is dextroconvex
curvature of the lumbar spine.
IMPRESSION: 1. There are dilated small bowel loops in the mid abdomen with
air-fluid levels measuring up to 4.4 cm. Findings may represent
postoperative ileus or small-bowel obstruction.
2. No free intraperitoneal air.
3. No acute cardiopulmonary process.

## 2021-09-22 IMAGING — CT CT ABD-PELV W/O CM
2 of 4 series · 16 of 46 positions shown, 18 images · non-contrast
Comparison: CT abdomen and pelvis [DATE].

CLINICAL DATA: Evaluate for bowel obstruction. Recent ileostomy
revision. Nausea and vomiting.



[Series 2: axial st · axial · 0.83mm/px · z∈[-462,-12]mm · 13 of 102 slices shown, 15 images]
[im 6/102  soft-tissue]
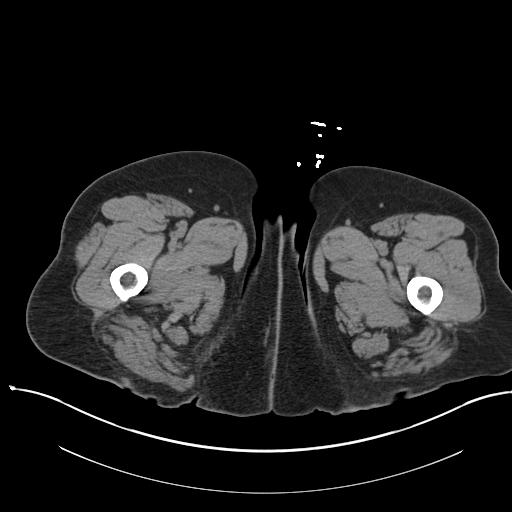
[im 6/102  bone]
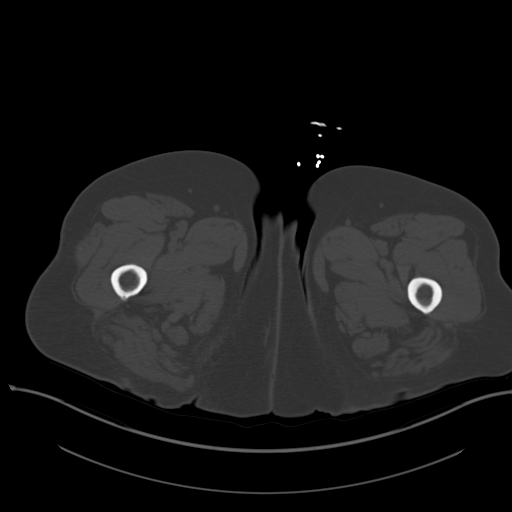
[im 12/102  soft-tissue]
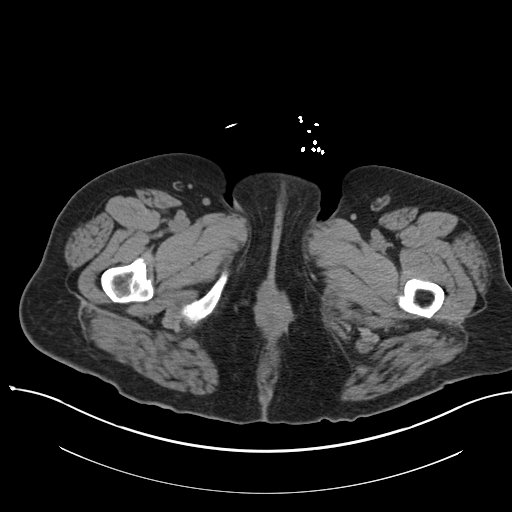
[im 24/102  soft-tissue]
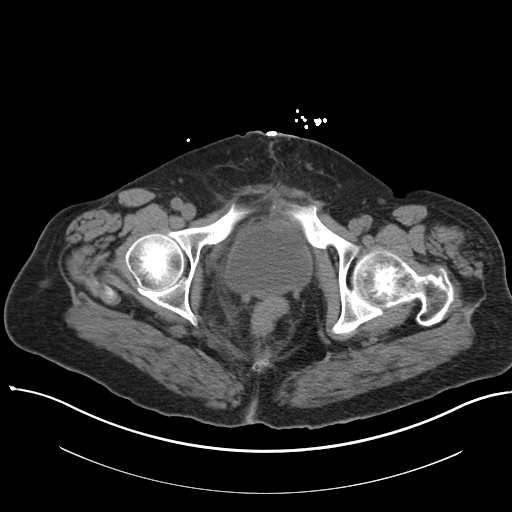
[im 30/102  soft-tissue]
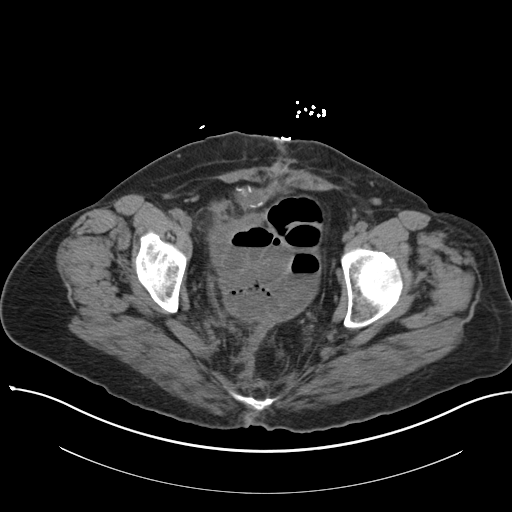
[im 36/102  soft-tissue]
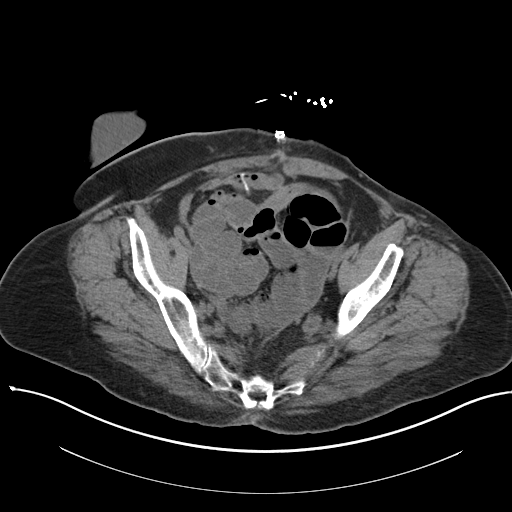
[im 42/102  soft-tissue]
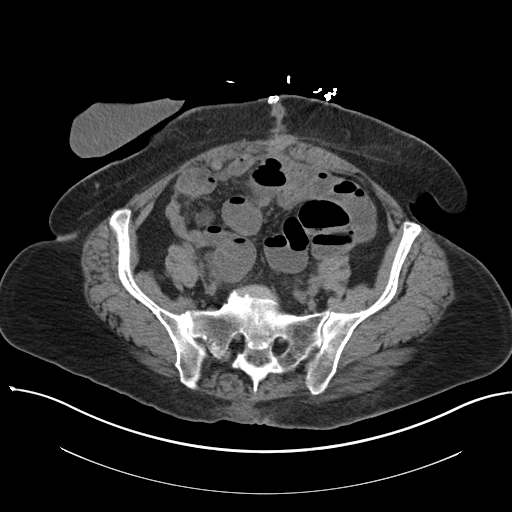
[im 54/102  soft-tissue]
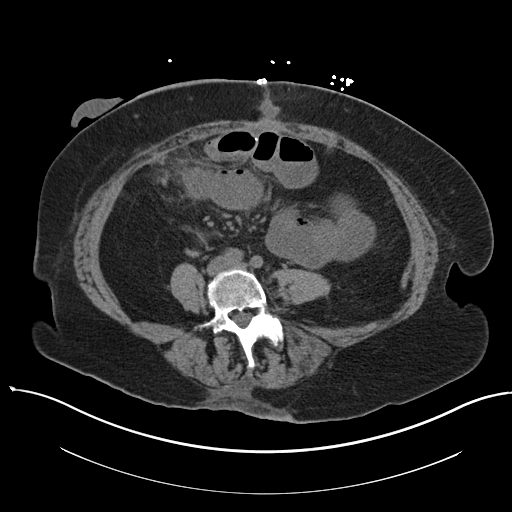
[im 60/102  soft-tissue]
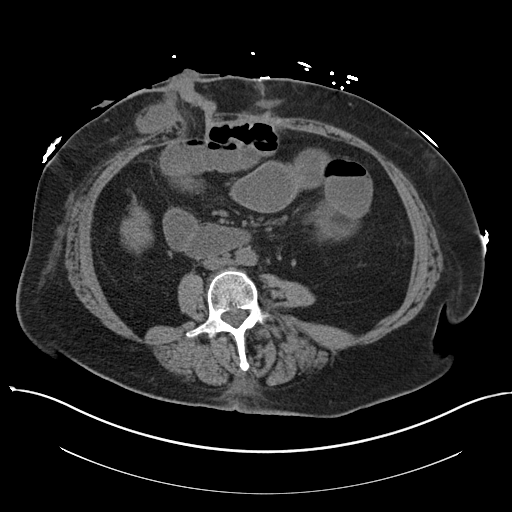
[im 66/102  soft-tissue]
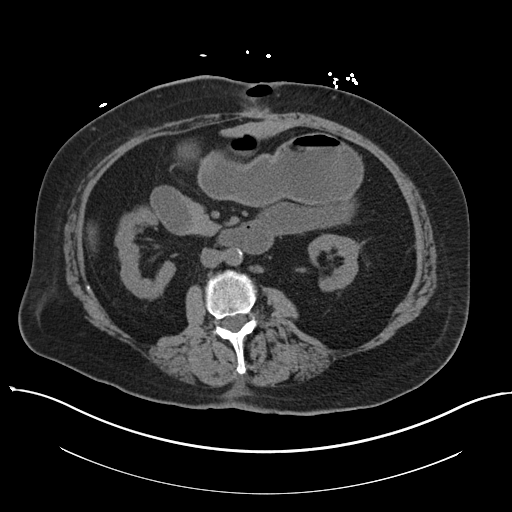
[im 66/102  bone]
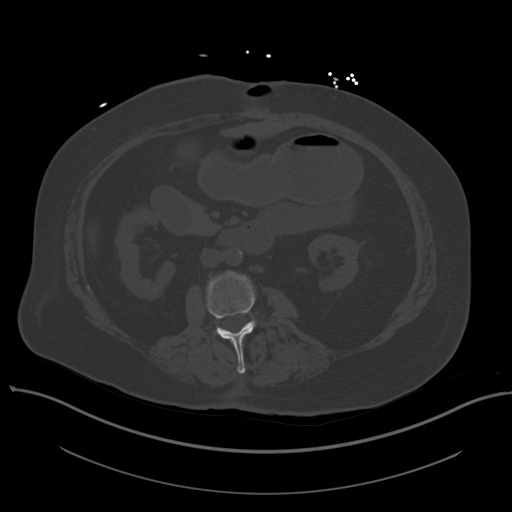
[im 72/102  soft-tissue]
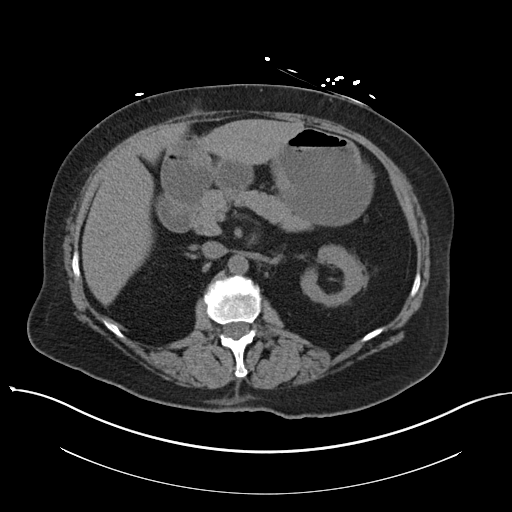
[im 78/102  soft-tissue]
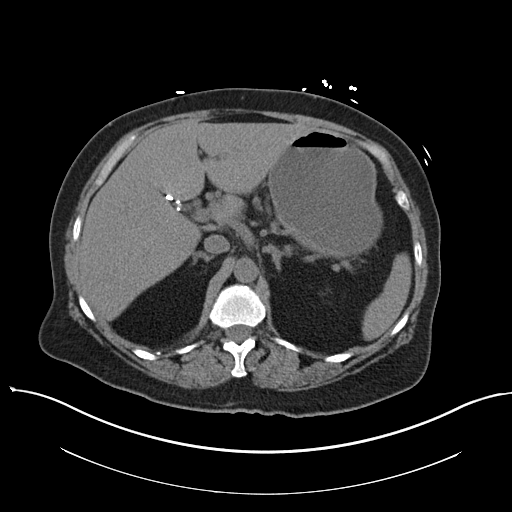
[im 90/102  soft-tissue]
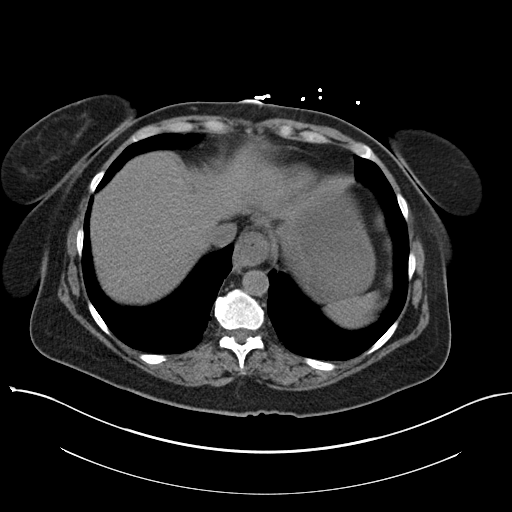
[im 96/102  soft-tissue]
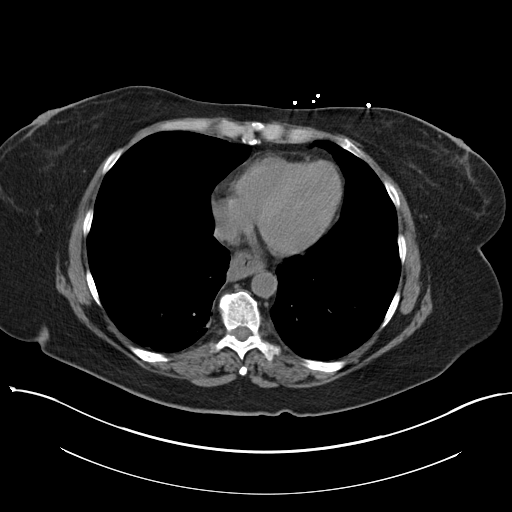

[Series 4: coronal st · coronal · 0.93mm/px · 3 of 145 slices shown]
[im 49/145  soft-tissue]
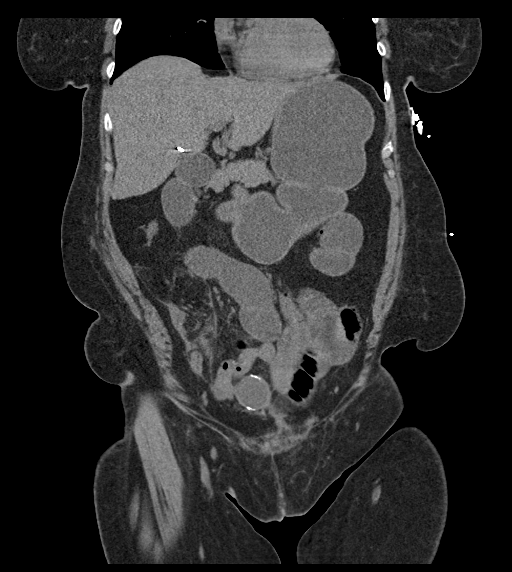
[im 65/145  soft-tissue]
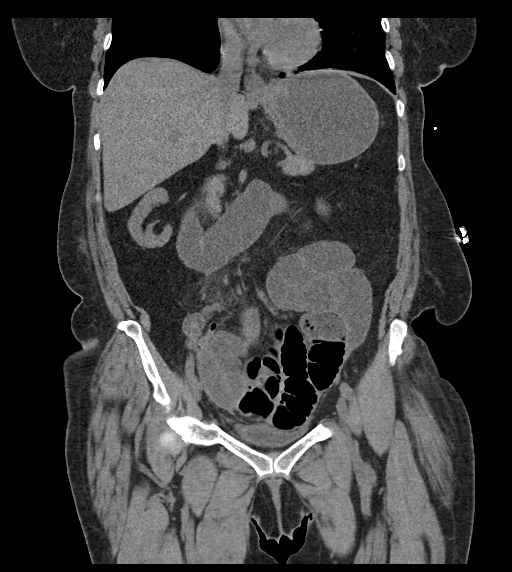
[im 81/145  soft-tissue]
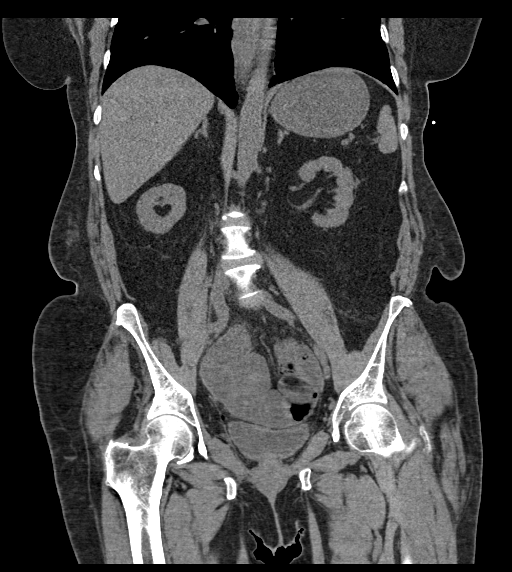

[16 of 46 positions shown; findings below may reference images not displayed]

FINDINGS: Lower chest: There is atelectasis or scarring in the lung bases.

Hepatobiliary: No focal liver abnormality is seen. Status post
cholecystectomy. No biliary dilatation.

Pancreas: Unremarkable. No pancreatic ductal dilatation or
surrounding inflammatory changes.

Spleen: Normal in size without focal abnormality.

Adrenals/Urinary Tract: There is a punctate nonobstructing left
renal calculus, unchanged. Otherwise, the kidneys, bladder, and
adrenal glands are within normal limits.

Stomach/Bowel: Right lower quadrant ileostomy is again seen. Small
bowel proximal to the ileostomy appears dilated with air-fluid
levels. Small bowel loops measure 3.7 cm. There is new fluid
distension of the stomach. There is likely wall thickening of the
proximalstomach and wall thickening of the distal esophagus. There
are some small bowel loops in the right lower quadrant which appear
decompressed. There is some fat stranding in the right lower
quadrant, likely postoperative, which appears similar to the prior
examination. Small amount of free air in the lower right pelvis is
unchanged from prior examination. Colectomy changes appear similar
to the prior study.

Vascular/Lymphatic: No significant vascular findings are present. No
enlarged abdominal or pelvic lymph nodes.

Reproductive: Status post hysterectomy. No adnexal masses.

Other: Midline abdominal skin staples in incision are present. Small
air-fluid collections are seen along the incision in the
subcutaneous tissues unchanged from the prior examination.

Musculoskeletal: Degenerative changes affect the spine.
IMPRESSION: 1. Patient is status post colectomy with right lower quadrant
ileostomy. Small bowel loops in the upper abdomen and stomach
appeared dilated concerning for small bowel obstruction. Transition
point is not definitive, but may be at the level of the ostomy. No
free air or pneumatosis.
2. There is wall thickening of the distal esophagus and proximal
stomach worrisome for gastritis and esophagitis.
3. Small amount of air in the pelvis is unchanged from prior, likely
postoperative.
4. Nonobstructing left renal calculus.

## 2021-09-22 MED ORDER — FENTANYL CITRATE PF 50 MCG/ML IJ SOSY
50.0000 ug | PREFILLED_SYRINGE | Freq: Once | INTRAMUSCULAR | Status: AC
  Administered 2021-09-22: 50 ug via INTRAVENOUS
  Filled 2021-09-22: qty 1

## 2021-09-22 MED ORDER — LACTATED RINGERS IV SOLN: INTRAVENOUS | Status: DC

## 2021-09-22 MED ORDER — ONDANSETRON HCL 4 MG/2ML IJ SOLN
4.0000 mg | Freq: Once | INTRAMUSCULAR | Status: AC
  Administered 2021-09-22: 4 mg via INTRAVENOUS
  Filled 2021-09-22: qty 2

## 2021-09-22 MED ORDER — LACTATED RINGERS IV BOLUS
1000.0000 mL | Freq: Once | INTRAVENOUS | Status: AC
  Administered 2021-09-22: 1000 mL via INTRAVENOUS

## 2021-09-22 NOTE — ED Notes (Addendum)
Granddaughter Bethann Berkshire would like to be called with any information.

## 2021-09-22 NOTE — ED Triage Notes (Signed)
Patient reports that she recently had an ileostomy revision and rectum sewn together. Today, the patient c/o N/V and weakness x 2 days.  HR-150-160 in triage. BP-99/65.

## 2021-09-22 NOTE — ED Notes (Signed)
Unsuccessful IV attempt in L AC. Pt expressed she is a hard stick, and has bruising present to veins.

## 2021-09-22 NOTE — ED Provider Notes (Signed)
[facility] EMERGENCY DEPARTMENT Provider Note    CSN: [id number] Arrival date & time: 09/22/21 1338  History Chief Complaint  Patient presents with   Emesis   Tachycardia    [name] is a 66 y.o. 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 She was in the ED 2 days ago for lower abdominal spasm pain and urinary frequency. Her labs were more or less at baseline, UA was neg. Prelim urine culture showed 20k colonies of serratia. She was ultimately discharged home with ditropan and reports her pain is improved some but she is now having frequent episodes of vomiting, poor intake of solids and liquids. She vomited up some blueberries which she had eaten 2 days ago. She has had her usual stoma output, liquid green stool, no blood.    Home Medications Prior to Admission medications   Medication Sig Start Date End Date Taking? Authorizing Provider  acetaminophen (TYLENOL) 500 MG tablet Take 500-1,000 mg by mouth at bedtime.    [provider]  allopurinol (ZYLOPRIM) 300 MG tablet TAKE 1 TABLET BY MOUTH EVERY DAY 08/17/21   Carollee Herter, Alferd Apa, DO  Calcium Carbonate Antacid (TUMS EXTRA STRENGTH 750 PO) Take 1-2 tablets by mouth See admin instructions. Chew 1-2 tablets by mouth as needed for heartburn or reflux    [provider]  DULoxetine (CYMBALTA) 60 MG capsule Take 2 capsules (120 mg total) by mouth daily. Patient taking differently: Take 120 mg by mouth at bedtime. 11/05/20   Carollee Herter, Yvonne R, DO  ENTYVIO 300 MG injection Inject 300 mg into the vein every 28 (twenty-eight) days.    [provider]  famotidine (PEPCID) 20 MG tablet TAKE 1 TABLET BY MOUTH TWICE A DAY Patient taking differently: Take 20 mg by mouth daily as needed for heartburn or indigestion. 04/01/21   Armbruster, Carlota Raspberry, MD  folic acid (FOLVITE) 1 MG tablet Take 2  tablets (2 mg total) by mouth daily. Patient taking differently: Take 1-2 mg by mouth daily. 05/26/17   [name], MD  gabapentin (NEURONTIN) 100 MG capsule TAKE 1 CAPSULE BY MOUTH THREE TIMES A DAY 09/02/21   Carollee Herter, Alferd Apa, DO  metoprolol succinate (TOPROL-XL) 100 MG 24 hr tablet Take 50-100 mg by mouth See admin instructions. Take 100 mg by mouth in the morning and 50 mg at bedtime    [provider]  ondansetron (ZOFRAN) 4 MG tablet Take 1 tablet (4 mg total) by mouth every 6 (six) hours as needed for up to 20 doses for nausea. 05/16/21   Antonieta Pert, MD  oxybutynin (DITROPAN) 5 MG tablet Take 1 tablet (5 mg total) by mouth every 8 (eight) hours as needed for bladder spasms. 09/20/21   Dorie Rank, MD  oxyCODONE (OXY IR/ROXICODONE) 5 MG immediate release tablet Take 5 mg by mouth at bedtime as needed for moderate pain. Patient not taking: Reported on 05/26/2021 04/27/21   [provider]  potassium chloride SA (KLOR-CON M) 20 MEQ tablet Take 1 tablet (20 mEq total) by mouth 2 (two) times daily for 5 days. 09/20/21 09/25/21  Dorie Rank, MD  Vitamin D, Ergocalciferol, (DRISDOL) 1.25 MG (50000 UNIT) CAPS capsule Take 50,000 Units by mouth every 30 (thirty) days.    [provider]     Allergies    Sulfa antibiotics, Morphine, and Sulfonamide derivatives  Review of Systems   Review of Systems Please see HPI for pertinent positives and negatives  Physical Exam BP (!) 155/86    Pulse (!) 129    Temp 99.7 F (37.6 C) (Oral)    Resp 18    Ht 5' 2"  (1.575 m)    Wt 83.9 kg    SpO2 100%    BMI 33.84 kg/m   Physical Exam Vitals and nursing note reviewed.  Constitutional:      Appearance: Normal appearance.  HENT:     Head: Normocephalic and atraumatic.     Nose: Nose normal.     Mouth/Throat:     Mouth: Mucous membranes are dry.  Eyes:     Extraocular Movements: Extraocular movements intact.     Conjunctiva/sclera: Conjunctivae normal.  Cardiovascular:      Rate and Rhythm: Tachycardia present.  Pulmonary:     Effort: Pulmonary effort is normal.     Breath sounds: Normal breath sounds.  Abdominal:     General: Abdomen is flat. Bowel sounds are normal. There is no distension.     Palpations: Abdomen is soft.     Tenderness: There is abdominal tenderness (mild, diffuse, no guarding).     Comments: Ileostomy in RLQ with liquid green stool, midline incision is CDI  Musculoskeletal:        General: No swelling. Normal range of motion.     Cervical back: Neck supple.  Skin:    General: Skin is warm and dry.  Neurological:     General: No focal deficit present.     Mental Status: She is alert.  Psychiatric:        Mood and Affect: Mood normal.    ED Results / Procedures / Treatments   EKG EKG Interpretation  Date/Time:  Tuesday September 22 2021 13:58:00 EST Ventricular Rate:  141 PR Interval:  126 QRS Duration: 100 QT Interval:  289 QTC Calculation: 443 R Axis:   -49 Text Interpretation: Sinus tachycardia Left anterior fascicular block Abnormal R-wave progression, early transition LVH with secondary repolarization abnormality No significant change since last tracing Confirmed by Calvert Cantor [phone]) on 09/22/2021 3:02:23 PM  Procedures Procedures  Medications Ordered in the ED Medications  lactated ringers infusion ( Intravenous New Bag/Given 09/22/21 1930)  lactated ringers bolus 1,000 mL (0 mLs Intravenous Stopped 09/22/21 1700)  ondansetron (ZOFRAN) injection 4 mg (4 mg Intravenous Given 09/22/21 1548)  fentaNYL (SUBLIMAZE) injection 50 mcg (50 mcg Intravenous Given 09/22/21 1550)  ondansetron (ZOFRAN) injection 4 mg (4 mg Intravenous Given 09/22/21 1928)  fentaNYL (SUBLIMAZE) injection 50 mcg (50 mcg Intravenous Given 09/22/21 1958)    Initial Impression and Plan  Patient with known UC, now with permanent end ileostomy has vomiting and abdominal cramping. She shows some signs of dehydration with dry mouth and tachycardia.  Borderline low BP on arrival improved in treatment room. Not febrile. Will check labs, give IVF, antiemetics and pain meds for comfort. CT done 2 days ago neg for SBO, will start with AAS here.   ED Course   Clinical Course as of 09/22/21 2039  Tue Sep 22, 2021  1615 CBC shows increased WBC and lower Hgb from previous. Given her tachycardia, consider infectious source vs dehydration. Awaiting lactic acid, but getting initial LR bolus now.  [CS]  7741 Magnesium is borderline low, similar to previous. HR is improving.  [CS]  1643 Lactic acid is normal. No signs of sepsis otherwise.  [CS]  2878 CMP with CKD similar to  recent ED visit.  [CS]  1017 AAS images and results reviewed, concerning for SBO. Will check CT, no IV contrast.  [CS]  5102 CT images and results reviewed, concerning for worsening SBO. Patient has not vomiting since arrival, will discuss with Surgery at [facility] regarding NG Tube and potential transfer to them.  [CS]  1908 Spoke with Dr. Eugenia Pancoast, Surgery at [facility] who will accept the patient in transfer. Patient is amenable to that. She has not vomited but reports continued nausea and would like to see if an NG tube will help her. Her HR has increased again while retching, continue IVF.  [CS]    Clinical Course User Index [CS] Truddie Hidden, MD     MDM Rules/Calculators/A&P Medical Decision Making Problems Addressed: Small bowel obstruction [facility]): acute illness or injury that poses a threat to life or bodily functions  Amount and/or Complexity of Data Reviewed External Data Reviewed: labs, radiology and notes. Labs: ordered. Decision-making details documented in ED Course. Radiology: ordered and independent interpretation performed. Decision-making details documented in ED Course. ECG/medicine tests: ordered and independent interpretation performed. Decision-making details documented in ED Course.  Risk Prescription drug management. Decision regarding  hospitalization.    Final Clinical Impression(s) / ED Diagnoses Final diagnoses:  Small bowel obstruction [facility])    Rx / DC Orders ED Discharge Orders     None        Truddie Hidden, MD 09/22/21 2039

## 2021-09-22 NOTE — ED Notes (Signed)
Attempted to call report to Marita Kansas, RN at [facility]. RN unable to take report at this time. Call back number given.

## 2021-09-23 LAB — URINE CULTURE: Culture: 20000 — AB

## 2021-09-24 ENCOUNTER — Telehealth: Payer: Self-pay

## 2021-09-24 NOTE — Telephone Encounter (Signed)
Post ED Visit - Positive Culture Follow-up  Culture report reviewed by antimicrobial stewardship pharmacist: [place] Team []  [address], Pharm.D. []  Heide Guile, Pharm.D., BCPS AQ-ID []  Parks Neptune, Pharm.D., BCPS []  Alycia Rossetti, Pharm.D., BCPS []  [place], Pharm.D., BCPS, AAHIVP []  Legrand Como, Pharm.D., BCPS, AAHIVP []  Salome Arnt, PharmD, BCPS []  Johnnette Gourd, PharmD, BCPS []  Hughes Better, PharmD, BCPS []  Leeroy Cha, PharmD []  Laqueta Linden, PharmD, BCPS []  Albertina Parr, PharmD  [place] Team [x]  Alycia Rossetti, PharmD []  Lindell Spar, PharmD []  Royetta Asal, PharmD []  Graylin Shiver, Rph []  Rema Fendt) Glennon Mac, PharmD []  Arlyn Dunning, PharmD []  Netta Cedars, PharmD []  Dia Sitter, PharmD []  Leone Haven, PharmD []  Gretta Arab, PharmD []  Theodis Shove, PharmD []  Peggyann Juba, PharmD []  Reuel Boom, PharmD   Positive urine culture Patient transferred to [facility] culture results noted by MD at time of transfer assumed reported. No further patient follow-up is required at this time.  Glennon Hamilton 09/24/2021, 9:33 AM

## 2021-09-30 ENCOUNTER — Telehealth: Payer: Self-pay | Admitting: Gastroenterology

## 2021-09-30 NOTE — Telephone Encounter (Signed)
Spoke with pt. Pt states she has been throwing up solid foods and is now on a soft diet. Pt is being discharged at [facility] and stated that if she continues to throw up or has any complications she wanted Dr. Havery Moros to know she is coming to him. Pt doesn't want to return to [facility] and is very unhappy with the care she received there. Let pt know I would send the message to her doctor. Pt verbalized understanding and had no other concerns at end of call.

## 2021-09-30 NOTE — Telephone Encounter (Signed)
Called and spoke with pt. Let pt know she should keep surgical follow up. Scheduled pt for follow up appt with Dr. Havery Moros on 10/22/21 at 10:10 am. Pt verbalized understanding and had no other concerns at end of call.

## 2021-09-30 NOTE — Telephone Encounter (Signed)
Patient called states she is in the hospital just had her second surgery and would like to discuss something with you no other information was provided.

## 2021-09-30 NOTE — Telephone Encounter (Signed)
Okay thanks for letting me know. Sorry to hear she has had a rough course but she got through her operation it sounds like. She needs to keep her surgical follow up after her operation, she can also follow up with me in the office if you can help coordinate with next routine office appointment. Thanks

## 2021-10-06 DIAGNOSIS — N184 Chronic kidney disease, stage 4 (severe): Secondary | ICD-10-CM | POA: Diagnosis not present

## 2021-10-13 DIAGNOSIS — H25813 Combined forms of age-related cataract, bilateral: Secondary | ICD-10-CM | POA: Diagnosis not present

## 2021-10-14 ENCOUNTER — Telehealth: Payer: Self-pay | Admitting: Gastroenterology

## 2021-10-14 NOTE — Telephone Encounter (Signed)
Patient called would like to know if  Dr. Havery Moros received blood work from her Nephrologist states she really needs to get treated sooner rather then later.

## 2021-10-14 NOTE — Telephone Encounter (Signed)
[facility] to request lab results. Lattie Haw said that pt last had labs on 10/06/21. Will await fax.   Pt is aware that we are awaiting fax from [place] and will have Dr. Havery Moros review one results are available. Pt mentioned that Dr. Marval Regal wanted to order IV iron but she wanted this to be ordered at the [facility] infusion center on [address] Dr. Marval Regal mentioned that he may not have privileges at that facility. I told her that I will make you aware.   Labs are in your IN box for review.

## 2021-10-15 ENCOUNTER — Encounter: Payer: Self-pay | Admitting: Gastroenterology

## 2021-10-15 NOTE — Telephone Encounter (Signed)
Called and spoke with patient regarding Dr. Doyne Keel recommendations. Pt is aware that I have placed the IV iron orders and the [facility] on [facility] will contact her directly to set up her appts. Pt verbalized understanding and had no concerns at the end of the call.  Venofer orders in epic.

## 2021-10-15 NOTE — Telephone Encounter (Signed)
Got it thanks. Labs reviewed.  10/06/20: Iron 35, TIBC 377, iron sat 9%, ferritin 157 Hgb 9.6, MCV 96 BUN 40, Cr 2.33  She has mild iron deficiency but likely multfactorial anemia in light of CKD.  If Dr. Marval Regal wants IV iron for her, we can do that. Can you set her up for IV iron, a few doses at [facility] infusion center? Thanks

## 2021-10-19 ENCOUNTER — Other Ambulatory Visit: Payer: Self-pay

## 2021-10-19 ENCOUNTER — Ambulatory Visit (INDEPENDENT_AMBULATORY_CARE_PROVIDER_SITE_OTHER): Payer: Medicare Other

## 2021-10-19 VITALS — BP 124/86 | HR 88 | Temp 97.7°F | Resp 18 | Ht 62.0 in | Wt 174.6 lb

## 2021-10-19 DIAGNOSIS — D649 Anemia, unspecified: Secondary | ICD-10-CM | POA: Diagnosis not present

## 2021-10-19 MED ORDER — EPINEPHRINE 0.3 MG/0.3ML IJ SOAJ: 0.3000 mg | Freq: Once | INTRAMUSCULAR | Status: DC | PRN

## 2021-10-19 MED ORDER — ALBUTEROL SULFATE HFA 108 (90 BASE) MCG/ACT IN AERS: 2.0000 | INHALATION_SPRAY | Freq: Once | RESPIRATORY_TRACT | Status: DC | PRN

## 2021-10-19 MED ORDER — DIPHENHYDRAMINE HCL 50 MG/ML IJ SOLN: 50.0000 mg | Freq: Once | INTRAMUSCULAR | Status: DC | PRN

## 2021-10-19 MED ORDER — METHYLPREDNISOLONE SODIUM SUCC 125 MG IJ SOLR: 125.0000 mg | Freq: Once | INTRAMUSCULAR | Status: DC | PRN

## 2021-10-19 MED ORDER — SODIUM CHLORIDE 0.9 % IV SOLN
200.0000 mg | Freq: Once | INTRAVENOUS | Status: AC
  Administered 2021-10-19: 200 mg via INTRAVENOUS
  Filled 2021-10-19: qty 10

## 2021-10-19 MED ORDER — FAMOTIDINE IN NACL 20-0.9 MG/50ML-% IV SOLN: 20.0000 mg | Freq: Once | INTRAVENOUS | Status: DC | PRN

## 2021-10-19 MED ORDER — SODIUM CHLORIDE 0.9 % IV SOLN: Freq: Once | INTRAVENOUS | Status: DC | PRN

## 2021-10-19 NOTE — Progress Notes (Signed)
Diagnosis: Iron Deficiency Anemia  Provider:  Marshell Garfinkel, MD  Procedure: Infusion  IV Type: Peripheral, IV Location: L Antecubital  Venofer (Iron Sucrose), Dose: 200 mg  Infusion Start Time: 09.30  Infusion Stop Time: 09.50  Post Infusion IV Care: Observation period completed and Peripheral IV Discontinued  Discharge: Condition: Good, Destination: Home . AVS provided to patient.   Performed by:  Arnoldo Morale, RN

## 2021-10-19 NOTE — Addendum Note (Signed)
Addended by: Terence Lux on: 10/19/2021 08:34 AM   Modules accepted: Orders

## 2021-10-20 ENCOUNTER — Telehealth: Payer: Self-pay | Admitting: Gastroenterology

## 2021-10-20 NOTE — Telephone Encounter (Signed)
[place] reference an order to give patient 1 Liter NS with her Iron Infusion tomorrow. They asked me to fax the order to [phone], which I did.

## 2021-10-20 NOTE — Telephone Encounter (Signed)
Returned patient's call and she is going to [facility] center tomorrow for Venofer Infusion and wants to know if she can get some IVF also. States her BP is low and the Infusion Center has given her extra IV fluids before., Please advise

## 2021-10-20 NOTE — Telephone Encounter (Signed)
Inbound call from patient stating that she has an appointment on 2/16 to get her iron and was wanting to see if there was anyway she could get some more fluids because she is getting low. Please advise.

## 2021-10-20 NOTE — Telephone Encounter (Signed)
Yes okay to give 1 L normal saline with IV iron.  Thanks

## 2021-10-21 ENCOUNTER — Other Ambulatory Visit: Payer: Self-pay

## 2021-10-21 ENCOUNTER — Ambulatory Visit (INDEPENDENT_AMBULATORY_CARE_PROVIDER_SITE_OTHER): Payer: Medicare Other | Admitting: *Deleted

## 2021-10-21 VITALS — BP 150/87 | HR 97 | Temp 97.9°F | Resp 16 | Ht 62.0 in | Wt 172.0 lb

## 2021-10-21 DIAGNOSIS — D649 Anemia, unspecified: Secondary | ICD-10-CM

## 2021-10-21 MED ORDER — FAMOTIDINE IN NACL 20-0.9 MG/50ML-% IV SOLN: 20.0000 mg | Freq: Once | INTRAVENOUS | Status: DC | PRN

## 2021-10-21 MED ORDER — SODIUM CHLORIDE 0.9 % IV SOLN: Freq: Once | INTRAVENOUS | Status: AC

## 2021-10-21 MED ORDER — EPINEPHRINE 0.3 MG/0.3ML IJ SOAJ: 0.3000 mg | Freq: Once | INTRAMUSCULAR | Status: DC | PRN

## 2021-10-21 MED ORDER — METHYLPREDNISOLONE SODIUM SUCC 125 MG IJ SOLR: 125.0000 mg | Freq: Once | INTRAMUSCULAR | Status: DC | PRN

## 2021-10-21 MED ORDER — DIPHENHYDRAMINE HCL 50 MG/ML IJ SOLN: 50.0000 mg | Freq: Once | INTRAMUSCULAR | Status: DC | PRN

## 2021-10-21 MED ORDER — ALBUTEROL SULFATE HFA 108 (90 BASE) MCG/ACT IN AERS: 2.0000 | INHALATION_SPRAY | Freq: Once | RESPIRATORY_TRACT | Status: DC | PRN

## 2021-10-21 MED ORDER — SODIUM CHLORIDE 0.9 % IV SOLN
200.0000 mg | Freq: Once | INTRAVENOUS | Status: AC
  Administered 2021-10-21: 200 mg via INTRAVENOUS
  Filled 2021-10-21: qty 10

## 2021-10-21 MED ORDER — SODIUM CHLORIDE 0.9 % IV SOLN: Freq: Once | INTRAVENOUS | Status: DC | PRN

## 2021-10-21 NOTE — Telephone Encounter (Signed)
Called and spoke with Jonelle Sidle, RN at the [facility] on [address]. He has not reviewed fax from yesterday but I did give him a verbal order with the OK to give patient 1 L of NS with IV iron infusions. Richardson Landry verbalized understanding and had no concerns.

## 2021-10-21 NOTE — Progress Notes (Signed)
Diagnosis: Iron Deficiency Anemia, Dehydration  Provider:  Marshell Garfinkel, MD  Procedure: Infusion  IV Type: Peripheral, IV Location: R Forearm  Normal Saline, Dose: 1000 ml  Infusion Start Time: 6168 am  Infusion Stop Time: 1013 am   Venofer (Iron Sucrose), Dose: 200 mg  Infusion Start Time: 3729 am  Infusion Stop Time: 0211 am      Post Infusion IV Care: Observation period completed and Peripheral IV Discontinued  Discharge: Condition: Good, Destination: Home . AVS provided to patient.   Performed by:  Oren Beckmann, RN

## 2021-10-22 ENCOUNTER — Encounter: Payer: Self-pay | Admitting: Gastroenterology

## 2021-10-22 ENCOUNTER — Ambulatory Visit (INDEPENDENT_AMBULATORY_CARE_PROVIDER_SITE_OTHER): Payer: Medicare Other | Admitting: Gastroenterology

## 2021-10-22 VITALS — BP 110/82 | HR 72 | Ht 62.0 in | Wt 173.8 lb

## 2021-10-22 DIAGNOSIS — K91850 Pouchitis: Secondary | ICD-10-CM | POA: Diagnosis not present

## 2021-10-22 DIAGNOSIS — K50818 Crohn's disease of both small and large intestine with other complication: Secondary | ICD-10-CM

## 2021-10-22 DIAGNOSIS — Z932 Ileostomy status: Secondary | ICD-10-CM

## 2021-10-22 NOTE — Patient Instructions (Addendum)
If you are age 68 or older, your body mass index should be between 23-30. Your Body mass index is 31.79 kg/m. If this is out of the aforementioned range listed, please consider follow up with your Primary Care Provider.  If you are age 31 or younger, your body mass index should be between 19-25. Your Body mass index is 31.79 kg/m. If this is out of the aformentioned range listed, please consider follow up with your Primary Care Provider.   ________________________________________________________  The [place] [facility] providers would like to encourage you to use [facility] to communicate with providers for non-urgent requests or questions.  Due to long hold times on the telephone, sending your provider a message by [facility] may be a faster and more efficient way to get a response.  Please allow 48 business hours for a response.  Please remember that this is for non-urgent requests.  _______________________________________________________  Follow up in 3 months.  Thank you for entrusting me with your care and for choosing [facility], Dr. Cascade Cellar

## 2021-10-22 NOTE — Progress Notes (Signed)
HPI :  IBD history: 66 year old white female with IBD ( Crohn's) since 1.  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 She was never able to be put in remission prior to colectomy. She was only exposed to steroids prior to her surgery. Post-operatively, she was on Remicade for 3-4 years but did not help too much. She had been on 6MP remotely but did not work for her. She was on Humira but also did not put her into remission and she developed high antibody titers and it was stopped. Most recently on Entyvio and low dose 6MP. 6MP was stopped due to worsening anemia.  She reports multiple courses of antibiotics with some benefit historically.  She has been on budesonider with ? Relief. Father and daughter have UC.  She has seen Jeanne Ivan and Dr. 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 Most recently saw Dr. Renelda Mom at [facility] and had diverting ileostomy April 23 2021. Returned for J pouch excision Jan 2023.   SINCE THE LAST VISIT   66 year old female here for follow-up visit.  Recall over the past year she has had a very complicated course.  Had ongoing pouch dysfunction question of Crohn's of the pouch versus chronic pouchitis.  Last summer had diverting ileostomy with Dr. Drue Flirt at [facility].  She had a complicated postoperative course with wound dehiscence/leakage/fistula that required repair.  She followed up with them in January and had pouch excision January 9.  She did well with that procedure but then was hospitalized a week later with ileus.  She recovered from that after few days in the hospital and was discharged home.  In a very general since she is doing much better than she has in the past.  Her ileostomy seems to be functioning well in regards to output.  She is eating okay without any recurrent nausea vomiting.  Has stable output from  the ostomy without blood.  She denies any significant abdominal discomfort at this time, previously she had a lot of discomfort.  She does states she cleans her perianal incision and wipes it at times, notices some superficial bleeding but no pain or purulence that she has appreciated.  She denies any symptoms of infection at this time.  She is hoping to have her cataracts removed next week as well.  She has not resumed using any Imodium, trying to use applesauce and Metamucil combination to provide some regularity to her ostomy output.  Recall she was previously on Entyvio infusions for her Crohn's disease, she has not had any Entyvio since June of last year.  She has had her kidney function monitored by nephrology, she was in the infusion center yesterday for IV fluids and asked for bag of IV fluids to keep her hydrated.  She needed that quite a bit after her diverting ileostomy with some high output.  She required course of TPN to help heal her abdominal wounds.  She generally has not needed fluids but states she feels better after receiving it yesterday.  Overall much improved with time compared to the last visit when I saw her.    Prior workup: Pouchoscopy 03/31/17 - anal fissure, mild anal canal stenosis, focal ulceration of pouch and more proximal ileum but improved compared to previous   Pouchoscopy 01/21/20 -  Preparation of the colon was poor. - A suspected  small perianal fistulous tract and anal canal stenosis found on digital rectal exam. - The ileal pouch has an angulated turn with very focal ulcerations, otherwise no inflammatory changes noted. biopsies obtained - Small area of nodularity along anal canal - biopsied to rule out AIN / condyloma - Suspected fistal of the distal pouch just proximal to anal canal Overall, only one focal ulceration noted and no other inflammatory changes seen. Incidentally noted suspected new fistula     EGD 01/21/20 -  - A 3 cm hiatal hernia was present. -  LA Grade A esophagitis was found 33 cm from the incisors - mild. - The exam of the esophagus was otherwise normal. No Barrett's esophagus. - Multiple small sessile polyps were found in the gastric fundus and in the gastric body, benign appearing, likely fundic gland polyps, a cluster was noted at the distal end of the hernia sac. Biopsies were taken with a cold forceps for histology. - The exam of the stomach was otherwise normal. - The duodenal bulb and second portion of the duodenum were normal.   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 Cellulitis extends superiorly throughout the posterior subcutaneous fat, adjacent to the gluteal and posterior paraspinal muscles. No evidence of abscess.     MRE 06/25/20 - IMPRESSION: 1. Persistent inflammatory changes near the upper gluteal cleft bilaterally between the gluteus maximus muscles. No discrete drainable soft tissue abscess or intramuscular involvement. 2. No MR findings for acute inflammatory small-bowel disease. However, study is limited without contrast.   CT scan abdomen/ pelvis without contrast 05/01/21: IMPRESSION: 1. Findings worrisome for an area of focal bowel perforation versus anastomosis leak within the anterior aspect of the mid abdomen, with extravasation of air and contrast into the adjacent surgical incision site. 2. Postoperative changes within subsequent right lower quadrant ileostomy. 3. Aortic atherosclerosis.   CT scan abdomen / pelvis without contrast 05/14/21: IMPRESSION: Status post proctocolectomy. Ileostomy is noted in right lower quadrant. There is no evidence of bowel obstruction seen at this time.   No definite acute abnormality is seen in the abdomen or pelvis at this time.    CT abdomen / pelvis without contrast 09/22/21: IMPRESSION: 1.  Patient is status post colectomy with right lower quadrant ileostomy. Small bowel loops in the upper abdomen and stomach appeared dilated concerning for small bowel obstruction. Transition point is not definitive, but may be at the level of the ostomy. No free air or pneumatosis. 2. There is wall thickening of the distal esophagus and proximal stomach worrisome for gastritis and esophagitis. 3. Small amount of air in the pelvis is unchanged from prior, likely postoperative. 4. Nonobstructing left renal calculus   Past Medical History:  Diagnosis Date   Abdominal pain    Hx   Allergy    Anal stenosis    Anemia    Anxiety    Arthritis    Asthma    patient does not have inhaler   Blood in stool    Hx   Blood in urine    Hx   Blood transfusion without reported diagnosis    Cataract    CKD (chronic kidney disease) stage 3, GFR 30-59 ml/min (HCC)    Crohn's colitis, other complication (HCC)    De Quervain's tenosynovitis    Depression    Difficulty urinating    Hx  Easy bruising    Esophagitis    Fainting    History - resolved - due to dehydration   Fatigue    Hx   Fibroid    Hx   Gastric polyp    GERD (gastroesophageal reflux disease)    Hearing loss    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   Hemorrhoids, external    Hemorrhoids, internal    Herpes, genital    vaginal treated 07/05/12 and pt states is resolved   History of cervical dysplasia    History of small bowel obstruction    Hyperlipidemia    currently no meds   Hyperparathyroidism    Hypertension    Hypokalemia    Hx   IBD (inflammatory bowel disease)    initially colectomy for suspected UC, 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   Obesity    Ovarian cyst    Poor dental hygiene    Pulmonary nodule, right    right upper lobe   Rectal bleeding    Hx   Rectal pain    Hx   Renal insufficiency    CKD - stage 3   RLS (restless legs syndrome)    no meds   Tooth infection 11/2016    right low   Ulcerative colitis    Visual disturbance    wears glasses   Weakness generalized    Hx - patient denies generalized weakness   Wears dentures    upper only     Past Surgical History:  Procedure Laterality Date   ANAL DILATION     CERVICAL BIOPSY  W/ LOOP ELECTRODE EXCISION     CHOLECYSTECTOMY     COLONOSCOPY     [name]   fatty tumor removed from back     X 2   HEMORRHOID SURGERY     ILEOSTOMY CLOSURE     RESTORATIVE PROCTOCOLECTOMY     with insertion of ileoanal J Pouch with loop ileostomy   SHOULDER ARTHROSCOPY WITH CAPSULORRHAPHY Left 06/14/2019   Procedure: LEFT SHOULDER ARTHRSCOPIC REPAIR OF BONY BANKART FRACTURE;  Surgeon: Tania Ade, MD;  Location: [facility];  Service: Orthopedics;  Laterality: Left;  REQUEST 49 MINUTE   SIGMOIDOSCOPY     TOTAL ABDOMINAL HYSTERECTOMY  1998   TAH/LSO   TUBAL LIGATION     UPPER GASTROINTESTINAL ENDOSCOPY     [name]   Family History  Problem Relation Age of Onset   Ulcerative colitis Father    Hypertension Father    Heart attack Father    Hypertension Mother    Heart disease Mother        s/p pci   Ulcerative colitis Daughter    Irritable bowel syndrome Other        grandchildren   Diabetes Sister    Cancer Sister        uterine   Cancer Maternal Uncle        LUNG   Colon cancer Neg Hx    Esophageal cancer Neg Hx    Stomach cancer Neg Hx    Rectal cancer Neg Hx    Social History   Tobacco Use   Smoking status: Former    Packs/day: 1.00    Years: 4.00    Pack years: 4.00    Types: Cigarettes    Quit date: 05/21/1979    Years since quitting: 42.4   Smokeless tobacco: Never  Vaping Use   Vaping Use: Never used  Substance Use Topics  Alcohol use: No    Alcohol/week: 0.0 standard drinks   Drug use: No   Current Outpatient Medications  Medication Sig Dispense Refill   acetaminophen (TYLENOL) 500 MG tablet Take 500-1,000 mg by mouth at bedtime.     allopurinol (ZYLOPRIM) 300 MG tablet TAKE 1 TABLET  BY MOUTH EVERY DAY 90 tablet 0   Calcium Carbonate Antacid (TUMS EXTRA STRENGTH 750 PO) Take 1-2 tablets by mouth See admin instructions. Chew 1-2 tablets by mouth as needed for heartburn or reflux     DULoxetine (CYMBALTA) 60 MG capsule Take 2 capsules (120 mg total) by mouth daily. (Patient taking differently: Take 120 mg by mouth at bedtime.) 180 capsule 1   famotidine (PEPCID) 20 MG tablet TAKE 1 TABLET BY MOUTH TWICE A DAY (Patient taking differently: Take 20 mg by mouth daily as needed for heartburn or indigestion.) 102 tablet 1   folic acid (FOLVITE) 1 MG tablet Take 2 tablets (2 mg total) by mouth daily. (Patient taking differently: Take 1-2 mg by mouth daily.) 180 tablet 3   metoprolol succinate (TOPROL-XL) 100 MG 24 hr tablet Take 50-100 mg by mouth See admin instructions. Take 100 mg by mouth in the morning and 50 mg at bedtime     Vitamin D, Ergocalciferol, (DRISDOL) 1.25 MG (50000 UNIT) CAPS capsule Take 50,000 Units by mouth every 30 (thirty) days.     No current facility-administered medications for this visit.   Allergies  Allergen Reactions   Sulfa Antibiotics Hives, Itching, Rash and Swelling   Morphine Other (See Comments)    "Gives me crazy dreams" (delusions, also)   Sulfonamide Derivatives Hives, Itching and Swelling     Review of Systems: All systems reviewed and negative except where noted in HPI.    CT Abdomen Pelvis Wo Contrast  Result Date: 09/22/2021 CLINICAL DATA:  Evaluate for bowel obstruction. Recent ileostomy revision. Nausea and vomiting. EXAM: CT ABDOMEN AND PELVIS WITHOUT CONTRAST TECHNIQUE: Multidetector CT imaging of the abdomen and pelvis was performed following the standard protocol without IV contrast. RADIATION DOSE REDUCTION: This exam was performed according to the departmental dose-optimization program which includes automated exposure control, adjustment of the mA and/or kV according to patient size and/or use of iterative reconstruction  technique. COMPARISON:  CT abdomen and pelvis 09/20/2021. FINDINGS: Lower chest: There is atelectasis or scarring in the lung bases. Hepatobiliary: No focal liver abnormality is seen. Status post cholecystectomy. No biliary dilatation. Pancreas: Unremarkable. No pancreatic ductal dilatation or surrounding inflammatory changes. Spleen: Normal in size without focal abnormality. Adrenals/Urinary Tract: There is a punctate nonobstructing left renal calculus, unchanged. Otherwise, the kidneys, bladder, and adrenal glands are within normal limits. Stomach/Bowel: Right lower quadrant ileostomy is again seen. Small bowel proximal to the ileostomy appears dilated with air-fluid levels. Small bowel loops measure 3.7 cm. There is new fluid distension of the stomach. There is likely wall thickening of the proximalstomach and wall thickening of the distal esophagus. There are some small bowel loops in the right lower quadrant which appear decompressed. There is some fat stranding in the right lower quadrant, likely postoperative, which appears similar to the prior examination. Small amount of free air in the lower right pelvis is unchanged from prior examination. Colectomy changes appear similar to the prior study. Vascular/Lymphatic: No significant vascular findings are present. No enlarged abdominal or pelvic lymph nodes. Reproductive: Status post hysterectomy. No adnexal masses. Other: Midline abdominal skin staples in incision are present. Small air-fluid collections are seen along the incision in  the subcutaneous tissues unchanged from the prior examination. Musculoskeletal: Degenerative changes affect the spine. IMPRESSION: 1. Patient is status post colectomy with right lower quadrant ileostomy. Small bowel loops in the upper abdomen and stomach appeared dilated concerning for small bowel obstruction. Transition point is not definitive, but may be at the level of the ostomy. No free air or pneumatosis. 2. There is wall  thickening of the distal esophagus and proximal stomach worrisome for gastritis and esophagitis. 3. Small amount of air in the pelvis is unchanged from prior, likely postoperative. 4. Nonobstructing left renal calculus. Electronically Signed   By: Ronney Asters M.D.   On: 09/22/2021 17:58   DG Abdomen Acute W/Chest  Result Date: 09/22/2021 CLINICAL DATA:  Vomiting, recent ileostomy.  Ulcerative colitis. EXAM: DG ABDOMEN ACUTE WITH 1 VIEW CHEST COMPARISON:  Abdominal series 11/04/2010. CT abdomen and pelvis 09/20/2021. FINDINGS: The lungs are clear. There is no pleural effusion or pneumothorax. Cardiomediastinal silhouette is within normal limits. Again seen are midline abdominal skin staples. Cholecystectomy clips are present. Dilated small bowel loops with air-fluid levels are again seen measuring up to 4.4 cm similar to the prior study. There is also an air-fluid level in the stomach. No definite free air under the diaphragm. No acute fractures are seen. There is dextroconvex curvature of the lumbar spine. IMPRESSION: 1. There are dilated small bowel loops in the mid abdomen with air-fluid levels measuring up to 4.4 cm. Findings may represent postoperative ileus or small-bowel obstruction. 2. No free intraperitoneal air. 3. No acute cardiopulmonary process. Electronically Signed   By: Ronney Asters M.D.   On: 09/22/2021 16:37    Physical Exam: BP 110/82    Pulse 72    Ht 5\' 2"  (1.575 m)    Wt 173 lb 12.8 oz (78.8 kg)    BMI 31.79 kg/m  Constitutional: Pleasant,well-developed, female in no acute distress. Abdominal / Perianal: Soft, nondistended, nontender. R sided ileostomy looks good. Prior scars healed. [facility] standby - perianal exam shows incision is healing well without purulence. Small excoriation.  Neurological: Alert and oriented to person place and time. Psychiatric: Normal mood and affect. Behavior is normal.   ASSESSMENT AND PLAN: 66 year old female here for reassessment the  following:  Crohn's disease History of pouchitis / pouch dysfunction Ileostomy in place  Very complicated history of Crohn's disease who had a colectomy, then refractory pouchitis with perianal leakage, concern for Crohn's of the pouch.  Ultimately led to diverting ileostomy last year with Dr. Drue Flirt.  She had a complicated postoperative course with wound dehiscence/infection/fistula.  Eventually healed from that.  Had J-pouch excision this past January.  Postoperatively admitted for ileus but recovered and now doing much better.  Her exam looks good today, no evidence of infection at the incision, she has small excoriation likely from her wiping the area.  I do not see any concerning findings today.  Her ostomy appears to be functioning okay at this time.  In general very happy with how she is doing today and she is feeling much better, hopefully she will continue to feel better with more energy over time.  We discussed that with her Crohn's history, unclear if she truly had Crohn's of the pouch or not -pathology from the J-pouch showed some nonspecific inflammatory changes.  We will consider surveilling her small intestine at some point time when she has recovered more.  Consider fecal calprotectin versus ileoscopy in the future.  She will see Dr. Drue Flirt of colorectal surgery next  week for postoperative visit.  We will continue to hold her Entyvio for now as long as she is clinically feeling well without evidence of active Crohn's.  Patient in agreement with the plan as outlined.  I will see her in 3 to 4 months for reassessment.  Jolly Mango, MD [facility]

## 2021-10-23 ENCOUNTER — Ambulatory Visit (INDEPENDENT_AMBULATORY_CARE_PROVIDER_SITE_OTHER): Payer: Medicare Other

## 2021-10-23 ENCOUNTER — Other Ambulatory Visit: Payer: Self-pay

## 2021-10-23 VITALS — BP 146/86 | HR 87 | Temp 98.2°F | Resp 18 | Ht 62.0 in | Wt 173.2 lb

## 2021-10-23 DIAGNOSIS — D649 Anemia, unspecified: Secondary | ICD-10-CM

## 2021-10-23 MED ORDER — ALTEPLASE 2 MG IJ SOLR: 2.0000 mg | Freq: Once | INTRAMUSCULAR | Status: DC | PRN

## 2021-10-23 MED ORDER — HEPARIN SOD (PORK) LOCK FLUSH 100 UNIT/ML IV SOLN: 500.0000 [IU] | Freq: Once | INTRAVENOUS | Status: DC | PRN

## 2021-10-23 MED ORDER — HEPARIN SOD (PORK) LOCK FLUSH 100 UNIT/ML IV SOLN: 250.0000 [IU] | Freq: Once | INTRAVENOUS | Status: DC | PRN

## 2021-10-23 MED ORDER — FAMOTIDINE IN NACL 20-0.9 MG/50ML-% IV SOLN: 20.0000 mg | Freq: Once | INTRAVENOUS | Status: DC | PRN

## 2021-10-23 MED ORDER — DIPHENHYDRAMINE HCL 50 MG/ML IJ SOLN: 50.0000 mg | Freq: Once | INTRAMUSCULAR | Status: DC | PRN

## 2021-10-23 MED ORDER — SODIUM CHLORIDE 0.9% FLUSH: 10.0000 mL | Freq: Once | INTRAVENOUS | Status: DC | PRN

## 2021-10-23 MED ORDER — SODIUM CHLORIDE 0.9 % IV SOLN: Freq: Once | INTRAVENOUS | Status: AC

## 2021-10-23 MED ORDER — SODIUM CHLORIDE 0.9 % IV SOLN
200.0000 mg | Freq: Once | INTRAVENOUS | Status: AC
  Administered 2021-10-23: 200 mg via INTRAVENOUS
  Filled 2021-10-23: qty 10

## 2021-10-23 MED ORDER — METHYLPREDNISOLONE SODIUM SUCC 125 MG IJ SOLR: 125.0000 mg | Freq: Once | INTRAMUSCULAR | Status: DC | PRN

## 2021-10-23 MED ORDER — EPINEPHRINE 0.3 MG/0.3ML IJ SOAJ: 0.3000 mg | Freq: Once | INTRAMUSCULAR | Status: DC | PRN

## 2021-10-23 MED ORDER — SODIUM CHLORIDE 0.9% FLUSH: 3.0000 mL | Freq: Once | INTRAVENOUS | Status: DC | PRN

## 2021-10-23 MED ORDER — SODIUM CHLORIDE 0.9 % IV SOLN: Freq: Once | INTRAVENOUS | Status: DC | PRN

## 2021-10-23 MED ORDER — ANTICOAGULANT SODIUM CITRATE 4% (200MG/5ML) IV SOLN: 5.0000 mL | Freq: Once | Status: DC | PRN

## 2021-10-23 MED ORDER — ALBUTEROL SULFATE HFA 108 (90 BASE) MCG/ACT IN AERS: 2.0000 | INHALATION_SPRAY | Freq: Once | RESPIRATORY_TRACT | Status: DC | PRN

## 2021-10-23 NOTE — Progress Notes (Signed)
Diagnosis: Dehydaration  Provider:  Marshell Garfinkel, MD  Procedure: Infusion  IV Type: Peripheral, IV Location: R Antecubital  Normal Saline, Dose: 1000 ml  Infusion Start Time: 10.40 10/23/2021  Infusion Stop Time: 11.44 10/23/2021    Venofer (Iron Sucrose), Dose: 200 mg  Infusion Start Time: 11.46 10/23/2021  Infusion Stop Time: 12.10 10/23/2021  Post Infusion IV Care: Peripheral IV Discontinued  Discharge: Condition: Good, Destination: Home . AVS provided to patient.   Performed by:  Arnoldo Morale, RN

## 2021-10-26 ENCOUNTER — Ambulatory Visit: Payer: Medicare Other

## 2021-10-28 ENCOUNTER — Ambulatory Visit: Payer: Medicare Other

## 2021-10-28 ENCOUNTER — Ambulatory Visit: Payer: Medicare Other | Admitting: Medical

## 2021-10-28 DIAGNOSIS — U071 COVID-19: Secondary | ICD-10-CM | POA: Diagnosis not present

## 2021-10-30 ENCOUNTER — Ambulatory Visit: Payer: Medicare Other

## 2021-11-03 ENCOUNTER — Other Ambulatory Visit: Payer: Self-pay

## 2021-11-03 ENCOUNTER — Ambulatory Visit (INDEPENDENT_AMBULATORY_CARE_PROVIDER_SITE_OTHER): Payer: Medicare Other | Admitting: *Deleted

## 2021-11-03 VITALS — BP 132/81 | HR 85 | Temp 97.9°F | Resp 16 | Ht 62.0 in | Wt 171.8 lb

## 2021-11-03 DIAGNOSIS — E876 Hypokalemia: Secondary | ICD-10-CM | POA: Diagnosis not present

## 2021-11-03 DIAGNOSIS — N2581 Secondary hyperparathyroidism of renal origin: Secondary | ICD-10-CM | POA: Diagnosis not present

## 2021-11-03 DIAGNOSIS — E8881 Metabolic syndrome: Secondary | ICD-10-CM | POA: Diagnosis not present

## 2021-11-03 DIAGNOSIS — D631 Anemia in chronic kidney disease: Secondary | ICD-10-CM | POA: Diagnosis not present

## 2021-11-03 DIAGNOSIS — I129 Hypertensive chronic kidney disease with stage 1 through stage 4 chronic kidney disease, or unspecified chronic kidney disease: Secondary | ICD-10-CM | POA: Diagnosis not present

## 2021-11-03 DIAGNOSIS — E871 Hypo-osmolality and hyponatremia: Secondary | ICD-10-CM | POA: Diagnosis not present

## 2021-11-03 DIAGNOSIS — N184 Chronic kidney disease, stage 4 (severe): Secondary | ICD-10-CM | POA: Diagnosis not present

## 2021-11-03 DIAGNOSIS — R768 Other specified abnormal immunological findings in serum: Secondary | ICD-10-CM | POA: Diagnosis not present

## 2021-11-03 DIAGNOSIS — M109 Gout, unspecified: Secondary | ICD-10-CM | POA: Diagnosis not present

## 2021-11-03 DIAGNOSIS — D649 Anemia, unspecified: Secondary | ICD-10-CM | POA: Diagnosis not present

## 2021-11-03 DIAGNOSIS — D509 Iron deficiency anemia, unspecified: Secondary | ICD-10-CM | POA: Diagnosis not present

## 2021-11-03 DIAGNOSIS — N390 Urinary tract infection, site not specified: Secondary | ICD-10-CM | POA: Diagnosis not present

## 2021-11-03 DIAGNOSIS — K5090 Crohn's disease, unspecified, without complications: Secondary | ICD-10-CM | POA: Diagnosis not present

## 2021-11-03 DIAGNOSIS — N179 Acute kidney failure, unspecified: Secondary | ICD-10-CM | POA: Diagnosis not present

## 2021-11-03 MED ORDER — SODIUM CHLORIDE 0.9 % IV SOLN: Freq: Once | INTRAVENOUS | Status: AC

## 2021-11-03 MED ORDER — METHYLPREDNISOLONE SODIUM SUCC 125 MG IJ SOLR: 125.0000 mg | Freq: Once | INTRAMUSCULAR | Status: DC | PRN

## 2021-11-03 MED ORDER — SODIUM CHLORIDE 0.9 % IV SOLN: Freq: Once | INTRAVENOUS | Status: DC | PRN

## 2021-11-03 MED ORDER — SODIUM CHLORIDE 0.9 % IV SOLN
200.0000 mg | Freq: Once | INTRAVENOUS | Status: AC
  Administered 2021-11-03: 200 mg via INTRAVENOUS
  Filled 2021-11-03: qty 10

## 2021-11-03 MED ORDER — FAMOTIDINE IN NACL 20-0.9 MG/50ML-% IV SOLN: 20.0000 mg | Freq: Once | INTRAVENOUS | Status: DC | PRN

## 2021-11-03 MED ORDER — DIPHENHYDRAMINE HCL 50 MG/ML IJ SOLN: 50.0000 mg | Freq: Once | INTRAMUSCULAR | Status: DC | PRN

## 2021-11-03 MED ORDER — EPINEPHRINE 0.3 MG/0.3ML IJ SOAJ: 0.3000 mg | Freq: Once | INTRAMUSCULAR | Status: DC | PRN

## 2021-11-03 MED ORDER — ALBUTEROL SULFATE HFA 108 (90 BASE) MCG/ACT IN AERS: 2.0000 | INHALATION_SPRAY | Freq: Once | RESPIRATORY_TRACT | Status: DC | PRN

## 2021-11-03 NOTE — Progress Notes (Addendum)
Diagnosis: Iron Deficiency Anemia, Hydration  Provider:  Marshell Garfinkel, MD  Procedure: Infusion  IV Type: Peripheral, IV Location: L Antecubital  Normal Saline, Dose: 1000 ml  Infusion Start Time: 1305 pm  Infusion Stop Time: 1416 pm  Venofer (Iron Sucrose), Dose: 200 mg  Infusion Start Time: 1418 pm  Infusion Stop Time: 1435 pm  Post Infusion IV Care: Observation period completed and Peripheral IV Discontinued  Discharge: Condition: Good, Destination: Home . AVS provided to patient.   Performed by:  Oren Beckmann, RN

## 2021-11-04 ENCOUNTER — Telehealth: Payer: Self-pay

## 2021-11-04 DIAGNOSIS — H25811 Combined forms of age-related cataract, right eye: Secondary | ICD-10-CM | POA: Diagnosis not present

## 2021-11-04 DIAGNOSIS — H2512 Age-related nuclear cataract, left eye: Secondary | ICD-10-CM | POA: Diagnosis not present

## 2021-11-04 DIAGNOSIS — H5703 Miosis: Secondary | ICD-10-CM | POA: Diagnosis not present

## 2021-11-04 DIAGNOSIS — H25812 Combined forms of age-related cataract, left eye: Secondary | ICD-10-CM | POA: Diagnosis not present

## 2021-11-09 ENCOUNTER — Other Ambulatory Visit: Payer: Self-pay

## 2021-11-09 ENCOUNTER — Telehealth: Payer: Self-pay | Admitting: Gastroenterology

## 2021-11-09 DIAGNOSIS — E860 Dehydration: Secondary | ICD-10-CM

## 2021-11-09 NOTE — Telephone Encounter (Signed)
Yes we can do that if she feels she really needs it to avoid dehydration, however hopefully post operatively she continues to improve, she was doing better when I last saw her. ?

## 2021-11-09 NOTE — Telephone Encounter (Signed)
Returned call to patient. I left her a detailed vm with information below. I told her that I have placed the orders for 1L of NS and the infusion center will call her to ser up her appt. I told pt to call us back if she has any questions or concerns. ? ?Order for 1L of NS entered. ?

## 2021-11-09 NOTE — Telephone Encounter (Signed)
Inbound call from patient . Reports she would like to be set up on fluids for IV. ?

## 2021-11-09 NOTE — Addendum Note (Signed)
Addended by: Yevette Edwards on: 11/09/2021 01:08 PM   Modules accepted: Orders

## 2021-11-10 ENCOUNTER — Telehealth (INDEPENDENT_AMBULATORY_CARE_PROVIDER_SITE_OTHER): Payer: Medicare Other | Admitting: Family Medicine

## 2021-11-10 ENCOUNTER — Other Ambulatory Visit: Payer: Self-pay

## 2021-11-10 ENCOUNTER — Ambulatory Visit (INDEPENDENT_AMBULATORY_CARE_PROVIDER_SITE_OTHER): Payer: Medicare Other | Admitting: *Deleted

## 2021-11-10 ENCOUNTER — Encounter: Payer: Self-pay | Admitting: Family Medicine

## 2021-11-10 VITALS — BP 148/80 | Resp 16 | Ht 62.0 in | Wt 171.0 lb

## 2021-11-10 DIAGNOSIS — K047 Periapical abscess without sinus: Secondary | ICD-10-CM | POA: Diagnosis not present

## 2021-11-10 DIAGNOSIS — E860 Dehydration: Secondary | ICD-10-CM | POA: Diagnosis not present

## 2021-11-10 MED ORDER — SODIUM CHLORIDE 0.9 % IV BOLUS
1000.0000 mL | Freq: Once | INTRAVENOUS | Status: AC
  Administered 2021-11-10: 1000 mL via INTRAVENOUS

## 2021-11-10 MED ORDER — AMOXICILLIN-POT CLAVULANATE 875-125 MG PO TABS: 1.0000 | ORAL_TABLET | Freq: Two times a day (BID) | ORAL | 0 refills | Status: DC

## 2021-11-10 NOTE — Assessment & Plan Note (Signed)
abx per orders  ?wam compresses  ?F/u dentist  ?

## 2021-11-10 NOTE — Progress Notes (Signed)
Diagnosis: Hydration ? ?Provider:  Marshell Garfinkel, MD ? ?Procedure: Infusion ? ?IV Type: Peripheral, IV Location: L Antecubital ? ?Normal Saline, Dose: 1000 ml ? ?Infusion Start Time: 0825am ? ?Infusion Stop Time: 0941 am ? ?Post Infusion IV Care: Observation period completed and Peripheral IV Discontinued ? ?Discharge: Condition: Good, Destination: Home . AVS provided to patient.  ? ?Performed by:  Oren Beckmann, RN  ?  ?

## 2021-11-10 NOTE — Progress Notes (Signed)
MyChart Video Visit    Virtual Visit via Video Note   This visit type was conducted due to national recommendations for restrictions regarding the COVID-19 Pandemic (e.g. social distancing) in an effort to limit this patient's exposure and mitigate transmission in our community. This patient is at least at moderate risk for complications without adequate follow up. This format is felt to be most appropriate for this patient at this time. Physical exam was limited by quality of the video and audio technology used for the visit. [name] was able to get the patient set up on a video visit.  Patient location: home alone Patient and provider in visit Provider location: Office  I discussed the limitations of evaluation and management by telemedicine and the availability of in person appointments. The patient expressed understanding and agreed to proceed.  Visit Date: 11/10/2021  Today's healthcare provider: [name], DO     Subjective:    Patient ID: [name], female    DOB: 1956-06-27, 66 y.o.   MRN: [id number]  Chief Complaint  Patient presents with   Dental Pain    Complains of tooth pain     HPI Patient is in today for tooth abscess lower front tooth.    Her face is swollen   she has an appointment to get the tooth pulled but she needs an abx before it can be pulled  Past Medical History:  Diagnosis Date   Abdominal pain    Hx   Allergy    Anal stenosis    Anemia    Anxiety    Arthritis    Asthma    patient does not have inhaler   Blood in stool    Hx   Blood in urine    Hx   Blood transfusion without reported diagnosis    Cataract    CKD (chronic kidney disease) stage 3, GFR 30-59 ml/min (HCC)    Crohn's colitis, other complication (HCC)    De Quervain's tenosynovitis    Depression    Difficulty urinating    Hx   Easy bruising    Esophagitis    Fainting    History - resolved - due to dehydration   Fatigue    Hx   Fibroid    Hx    Gastric polyp    GERD (gastroesophageal reflux disease)    Hearing loss    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   Hemorrhoids, external    Hemorrhoids, internal    Herpes, genital    vaginal treated 07/05/12 and pt states is resolved   History of cervical dysplasia    History of small bowel obstruction    Hyperlipidemia    currently no meds   Hyperparathyroidism    Hypertension    Hypokalemia    Hx   IBD (inflammatory bowel disease)    initially colectomy for suspected UC, 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   Obesity    Ovarian cyst    Poor dental hygiene    Pulmonary nodule, right    right upper lobe   Rectal bleeding    Hx   Rectal pain    Hx   Renal insufficiency    CKD - stage 3   RLS (restless legs syndrome)    no meds   Tooth infection 11/2016   right low   Ulcerative colitis    Visual disturbance    wears glasses  Weakness generalized    Hx - patient denies generalized weakness   Wears dentures    upper only    Past Surgical History:  Procedure Laterality Date   ANAL DILATION     CERVICAL BIOPSY  W/ LOOP ELECTRODE EXCISION     CHOLECYSTECTOMY     COLONOSCOPY     [name]   fatty tumor removed from back     X 2   HEMORRHOID SURGERY     ILEOSTOMY CLOSURE     RESTORATIVE PROCTOCOLECTOMY     with insertion of ileoanal J Pouch with loop ileostomy   SHOULDER ARTHROSCOPY WITH CAPSULORRHAPHY Left 06/14/2019   Procedure: LEFT SHOULDER ARTHRSCOPIC REPAIR OF BONY BANKART FRACTURE;  Surgeon: Jeanette Broom, MD;  Location: [facility];  Service: Orthopedics;  Laterality: Left;  REQUEST 90 MINUTE   SIGMOIDOSCOPY     TOTAL ABDOMINAL HYSTERECTOMY  1998   TAH/LSO   TUBAL LIGATION     UPPER GASTROINTESTINAL ENDOSCOPY     [name]    Family History  Problem Relation Age of Onset   Ulcerative colitis Father    Hypertension Father    Heart attack Father    Hypertension Mother    Heart disease Mother        s/p pci   Ulcerative colitis Daughter     Irritable bowel syndrome Other        grandchildren   Diabetes Sister    Cancer Sister        uterine   Cancer Maternal Uncle        LUNG   Colon cancer Neg Hx    Esophageal cancer Neg Hx    Stomach cancer Neg Hx    Rectal cancer Neg Hx     Social History   Socioeconomic History   Marital status: Divorced    Spouse name: Not on file   Number of children: Not on file   Years of education: Not on file   Highest education level: Not on file  Occupational History   Not on file  Tobacco Use   Smoking status: Former    Packs/day: 1.00    Years: 4.00    Pack years: 4.00    Types: Cigarettes    Quit date: 05/21/1979    Years since quitting: 42.5   Smokeless tobacco: Never  Vaping Use   Vaping Use: Never used  Substance and Sexual Activity   Alcohol use: No    Alcohol/week: 0.0 standard drinks   Drug use: No   Sexual activity: Yes    Birth control/protection: Post-menopausal    Comment: 1st intercourse 66 yo-Fewer than 5 partners  Other Topics Concern   Not on file  Social History Narrative   Not on file   Social Determinants of Health   Financial Resource Strain: Not on file  Food Insecurity: Not on file  Transportation Needs: Not on file  Physical Activity: Not on file  Stress: Not on file  Social Connections: Not on file  Intimate Partner Violence: Not on file    Outpatient Medications Prior to Visit  Medication Sig Dispense Refill   acetaminophen (TYLENOL) 500 MG tablet Take 500-1,000 mg by mouth at bedtime.     allopurinol (ZYLOPRIM) 300 MG tablet TAKE 1 TABLET BY MOUTH EVERY DAY 90 tablet 0   Calcium Carbonate Antacid (TUMS EXTRA STRENGTH 750 PO) Take 1-2 tablets by mouth See admin instructions. Chew 1-2 tablets by mouth as needed for heartburn or reflux     DULoxetine (CYMBALTA)  60 MG capsule Take 2 capsules (120 mg total) by mouth daily. (Patient taking differently: Take 120 mg by mouth at bedtime.) 180 capsule 1   famotidine (PEPCID) 20 MG tablet TAKE  1 TABLET BY MOUTH TWICE A DAY (Patient taking differently: Take 20 mg by mouth daily as needed for heartburn or indigestion.) 180 tablet 1   folic acid (FOLVITE) 1 MG tablet Take 2 tablets (2 mg total) by mouth daily. (Patient taking differently: Take 1-2 mg by mouth daily.) 180 tablet 3   metoprolol succinate (TOPROL-XL) 100 MG 24 hr tablet Take 50-100 mg by mouth See admin instructions. Take 100 mg by mouth in the morning and 50 mg at bedtime     Vitamin D, Ergocalciferol, (DRISDOL) 1.25 MG (50000 UNIT) CAPS capsule Take 50,000 Units by mouth every 30 (thirty) days.     No facility-administered medications prior to visit.    Allergies  Allergen Reactions   Sulfa Antibiotics Hives, Itching, Rash and Swelling   Morphine Other (See Comments)    "Gives me crazy dreams" (delusions, also)   Sulfonamide Derivatives Hives, Itching and Swelling    Review of Systems  Constitutional:  Negative for fever and malaise/fatigue.  HENT:  Negative for congestion.        Tooth pain  Eyes:  Negative for blurred vision.  Respiratory:  Negative for shortness of breath.   Cardiovascular:  Negative for chest pain, palpitations and leg swelling.  Gastrointestinal:  Negative for abdominal pain, blood in stool and nausea.  Genitourinary:  Negative for dysuria and frequency.  Musculoskeletal:  Negative for falls.  Skin:  Negative for rash.  Neurological:  Negative for dizziness, loss of consciousness and headaches.  Endo/Heme/Allergies:  Negative for environmental allergies.  Psychiatric/Behavioral:  Negative for depression. The patient is not nervous/anxious.       Objective:    Physical Exam Vitals and nursing note reviewed.  HENT:     Mouth/Throat:     Comments: Lower jaw swollen and painful  Neurological:     Mental Status: She is alert.  Psychiatric:        Mood and Affect: Mood normal.        Behavior: Behavior normal.    BP (!) 148/80   Resp 16   Ht 5\' 2"  (1.575 m)   Wt 171 lb (77.6  kg)   BMI 31.28 kg/m  Wt Readings from Last 3 Encounters:  11/10/21 171 lb (77.6 kg)  11/10/21 171 lb 9.6 oz (77.8 kg)  11/03/21 171 lb 12.8 oz (77.9 kg)    Diabetic Foot Exam - Simple   No data filed    Lab Results  Component Value Date   WBC 16.4 (H) 09/22/2021   HGB 7.7 (L) 09/22/2021   HCT 24.3 (L) 09/22/2021   PLT 543 (H) 09/22/2021   GLUCOSE 136 (H) 09/22/2021   CHOL 190 02/26/2020   TRIG 152.0 (H) 02/26/2020   HDL 43.50 02/26/2020   LDLDIRECT 108.0 12/17/2019   LDLCALC 116 (H) 02/26/2020   ALT 23 09/22/2021   AST 23 09/22/2021   NA 134 (L) 09/22/2021   K 3.8 09/22/2021   CL 96 (L) 09/22/2021   CREATININE 2.60 (H) 09/22/2021   BUN 37 (H) 09/22/2021   CO2 25 09/22/2021   TSH 4.18 02/26/2020   INR 1.0 05/26/2021   HGBA1C 5.6 08/23/2020    Lab Results  Component Value Date   TSH 4.18 02/26/2020   Lab Results  Component Value Date   WBC 16.4 (  H) 09/22/2021   HGB 7.7 (L) 09/22/2021   HCT 24.3 (L) 09/22/2021   MCV 105.2 (H) 09/22/2021   PLT 543 (H) 09/22/2021   Lab Results  Component Value Date   NA 134 (L) 09/22/2021   K 3.8 09/22/2021   CO2 25 09/22/2021   GLUCOSE 136 (H) 09/22/2021   BUN 37 (H) 09/22/2021   CREATININE 2.60 (H) 09/22/2021   BILITOT 0.8 09/22/2021   ALKPHOS 113 09/22/2021   AST 23 09/22/2021   ALT 23 09/22/2021   PROT 7.7 09/22/2021   ALBUMIN 3.5 09/22/2021   CALCIUM 9.9 09/22/2021   ANIONGAP 13 09/22/2021   GFR 10.27 (LL) 05/25/2021   Lab Results  Component Value Date   CHOL 190 02/26/2020   Lab Results  Component Value Date   HDL 43.50 02/26/2020   Lab Results  Component Value Date   LDLCALC 116 (H) 02/26/2020   Lab Results  Component Value Date   TRIG 152.0 (H) 02/26/2020   Lab Results  Component Value Date   CHOLHDL 4 02/26/2020   Lab Results  Component Value Date   HGBA1C 5.6 08/23/2020       Assessment & Plan:   Problem List Items Addressed This Visit       Unprioritized   Tooth abscess -  Primary    abx per orders  wam compresses  F/u dentist       Relevant Medications   amoxicillin-clavulanate (AUGMENTIN) 875-125 MG tablet    I am having [name] start on amoxicillin-clavulanate. I am also having her maintain her folic acid, metoprolol succinate, Vitamin D (Ergocalciferol), DULoxetine, famotidine, acetaminophen, Calcium Carbonate Antacid (TUMS EXTRA STRENGTH 750 PO), and allopurinol.  Meds ordered this encounter  Medications   amoxicillin-clavulanate (AUGMENTIN) 875-125 MG tablet    Sig: Take 1 tablet by mouth 2 (two) times daily.    Dispense:  20 tablet    Refill:  0    I discussed the assessment and treatment plan with the patient. The patient was provided an opportunity to ask questions and all were answered. The patient agreed with the plan and demonstrated an understanding of the instructions.   The patient was advised to call back or seek an in-person evaluation if the symptoms worsen or if the condition fails to improve as anticipated.     [name], DO [facility] [phone] (phone) [phone] (fax)  [facility]

## 2021-11-11 DIAGNOSIS — H2511 Age-related nuclear cataract, right eye: Secondary | ICD-10-CM | POA: Diagnosis not present

## 2021-11-11 DIAGNOSIS — H25811 Combined forms of age-related cataract, right eye: Secondary | ICD-10-CM | POA: Diagnosis not present

## 2021-11-13 ENCOUNTER — Other Ambulatory Visit: Payer: Self-pay | Admitting: Family Medicine

## 2021-11-25 ENCOUNTER — Other Ambulatory Visit: Payer: Self-pay | Admitting: Family Medicine

## 2021-11-25 DIAGNOSIS — F322 Major depressive disorder, single episode, severe without psychotic features: Secondary | ICD-10-CM

## 2021-11-25 NOTE — Telephone Encounter (Signed)
30 day supply sent, letter sent to Pt via Mychart to schedule appt.  ?

## 2021-12-07 ENCOUNTER — Encounter: Payer: Self-pay | Admitting: Gastroenterology

## 2021-12-07 ENCOUNTER — Encounter: Payer: Self-pay | Admitting: Family

## 2021-12-08 ENCOUNTER — Encounter: Payer: Self-pay | Admitting: Family Medicine

## 2021-12-08 ENCOUNTER — Ambulatory Visit (INDEPENDENT_AMBULATORY_CARE_PROVIDER_SITE_OTHER): Payer: Medicare Other | Admitting: Family Medicine

## 2021-12-08 ENCOUNTER — Ambulatory Visit (HOSPITAL_BASED_OUTPATIENT_CLINIC_OR_DEPARTMENT_OTHER)
Admission: RE | Admit: 2021-12-08 | Discharge: 2021-12-08 | Disposition: A | Discharge status: Home / Self Care | Payer: Medicare Other | Source: Ambulatory Visit | Attending: Family Medicine | Admitting: Family Medicine

## 2021-12-08 VITALS — BP 118/86 | HR 104 | Temp 97.7°F | Resp 20 | Ht 62.0 in | Wt 173.6 lb

## 2021-12-08 DIAGNOSIS — M542 Cervicalgia: Secondary | ICD-10-CM | POA: Insufficient documentation

## 2021-12-08 DIAGNOSIS — I10 Essential (primary) hypertension: Secondary | ICD-10-CM | POA: Diagnosis not present

## 2021-12-08 DIAGNOSIS — R200 Anesthesia of skin: Secondary | ICD-10-CM

## 2021-12-08 LAB — LIPID PANEL
Cholesterol: 252 mg/dL — ABNORMAL HIGH (ref 0–200)
HDL: 48.8 mg/dL (ref >39.00)
NonHDL: 203.45
Total CHOL/HDL Ratio: 5
Triglycerides: 383 mg/dL — ABNORMAL HIGH (ref 0.0–149.0)
VLDL: 76.6 mg/dL — ABNORMAL HIGH (ref 0.0–40.0)

## 2021-12-08 LAB — TSH: TSH: 3.75 u[IU]/mL (ref 0.35–5.50)

## 2021-12-08 LAB — CBC WITH DIFFERENTIAL/PLATELET
Basophils Absolute: 0.1 10*3/uL (ref 0.0–0.1)
Basophils Relative: 0.6 % (ref 0.0–3.0)
Eosinophils Absolute: 0.6 10*3/uL (ref 0.0–0.7)
Eosinophils Relative: 5.2 % — ABNORMAL HIGH (ref 0.0–5.0)
HCT: 33.9 % — ABNORMAL LOW (ref 36.0–46.0)
Hemoglobin: 10.8 g/dL — ABNORMAL LOW (ref 12.0–15.0)
Lymphocytes Relative: 21 % (ref 12.0–46.0)
Lymphs Abs: 2.3 10*3/uL (ref 0.7–4.0)
MCHC: 31.9 g/dL (ref 30.0–36.0)
MCV: 103.3 fl — ABNORMAL HIGH (ref 78.0–100.0)
Monocytes Absolute: 0.8 10*3/uL (ref 0.1–1.0)
Monocytes Relative: 7.6 % (ref 3.0–12.0)
Neutro Abs: 7.3 10*3/uL (ref 1.4–7.7)
Neutrophils Relative %: 65.6 % (ref 43.0–77.0)
Platelets: 368 10*3/uL (ref 150.0–400.0)
RBC: 3.28 Mil/uL — ABNORMAL LOW (ref 3.87–5.11)
RDW: 19.6 % — ABNORMAL HIGH (ref 11.5–15.5)
WBC: 11.1 10*3/uL — ABNORMAL HIGH (ref 4.0–10.5)

## 2021-12-08 LAB — COMPREHENSIVE METABOLIC PANEL
ALT: 23 U/L (ref 0–35)
AST: 22 U/L (ref 0–37)
Albumin: 4.3 g/dL (ref 3.5–5.2)
Alkaline Phosphatase: 92 U/L (ref 39–117)
BUN: 74 mg/dL — ABNORMAL HIGH (ref 6–23)
CO2: 16 mEq/L — ABNORMAL LOW (ref 19–32)
Calcium: 9.8 mg/dL (ref 8.4–10.5)
Chloride: 107 mEq/L (ref 96–112)
Creatinine, Ser: 3.22 mg/dL — ABNORMAL HIGH (ref 0.40–1.20)
GFR: 14.28 mL/min — CL (ref >60.00)
Glucose, Bld: 81 mg/dL (ref 70–99)
Potassium: 4.4 mEq/L (ref 3.5–5.1)
Sodium: 134 mEq/L — ABNORMAL LOW (ref 135–145)
Total Bilirubin: 0.3 mg/dL (ref 0.2–1.2)
Total Protein: 7.8 g/dL (ref 6.0–8.3)

## 2021-12-08 LAB — LDL CHOLESTEROL, DIRECT: Direct LDL: 130 mg/dL

## 2021-12-08 LAB — VITAMIN B12: Vitamin B-12: 827 pg/mL (ref 211–911)

## 2021-12-08 IMAGING — DX DG CERVICAL SPINE COMPLETE 4+V
5 series · 5 of 5 positions shown · non-contrast
Comparison: None.

CLINICAL DATA: Midline posterior neck pain x 5-6 months.

EXAM:
CERVICAL SPINE - COMPLETE 4+ VIEW

[c-spine lat]
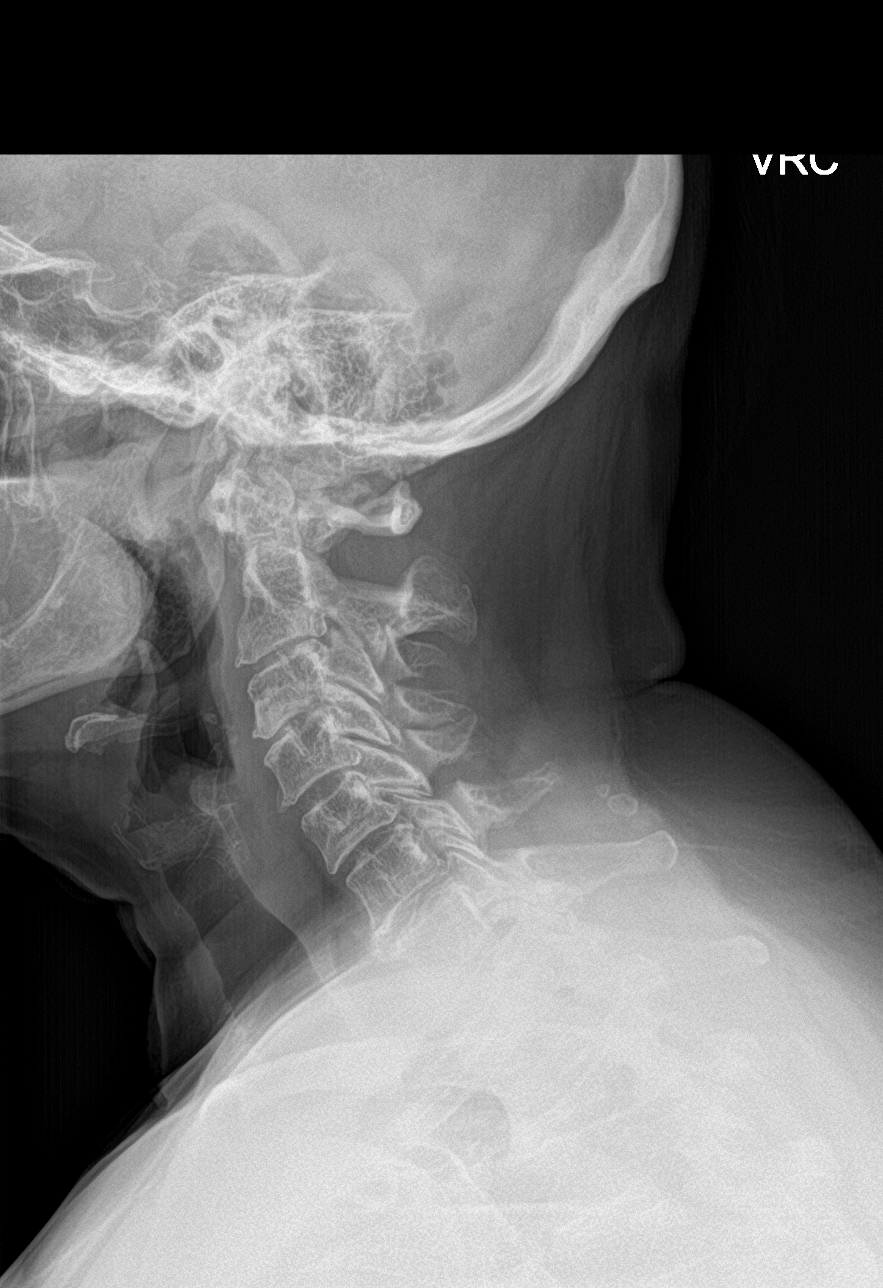

[c-spine obl (1 of 2)]
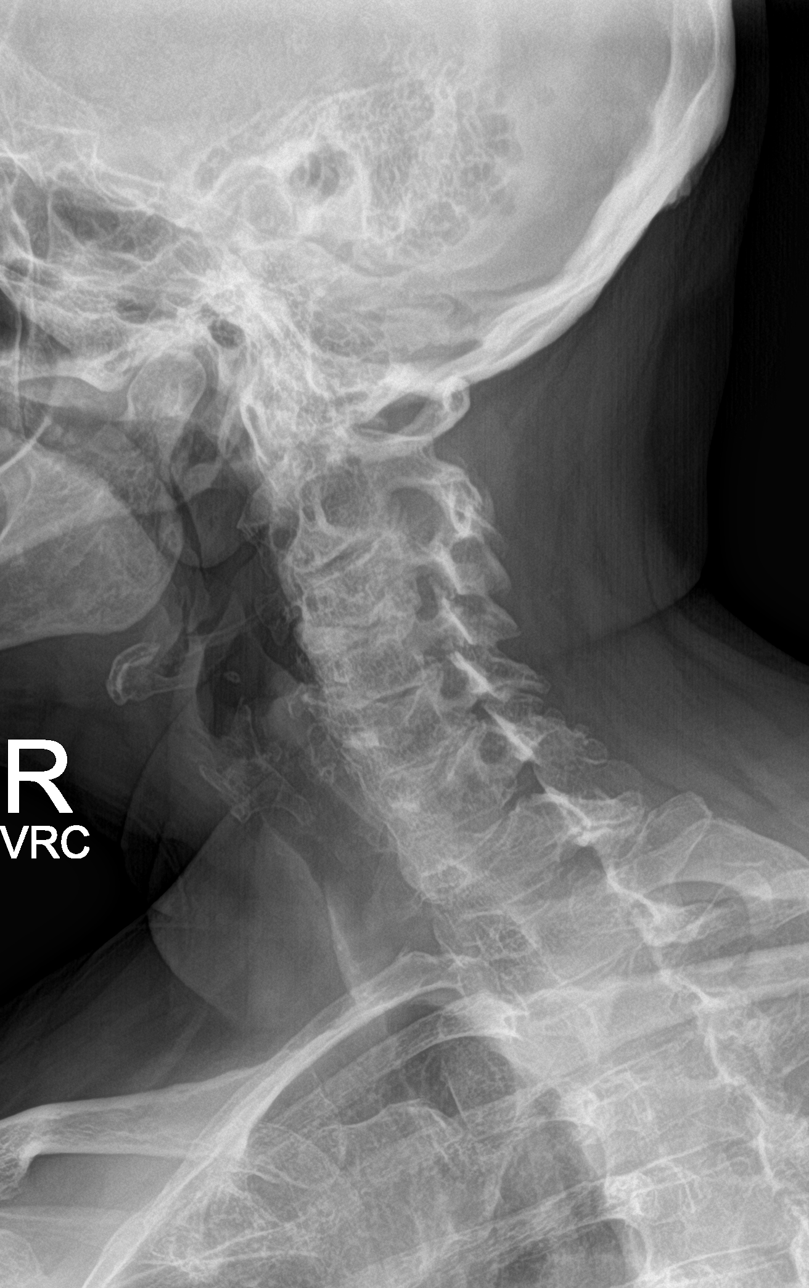

[c-spine obl (2 of 2)]
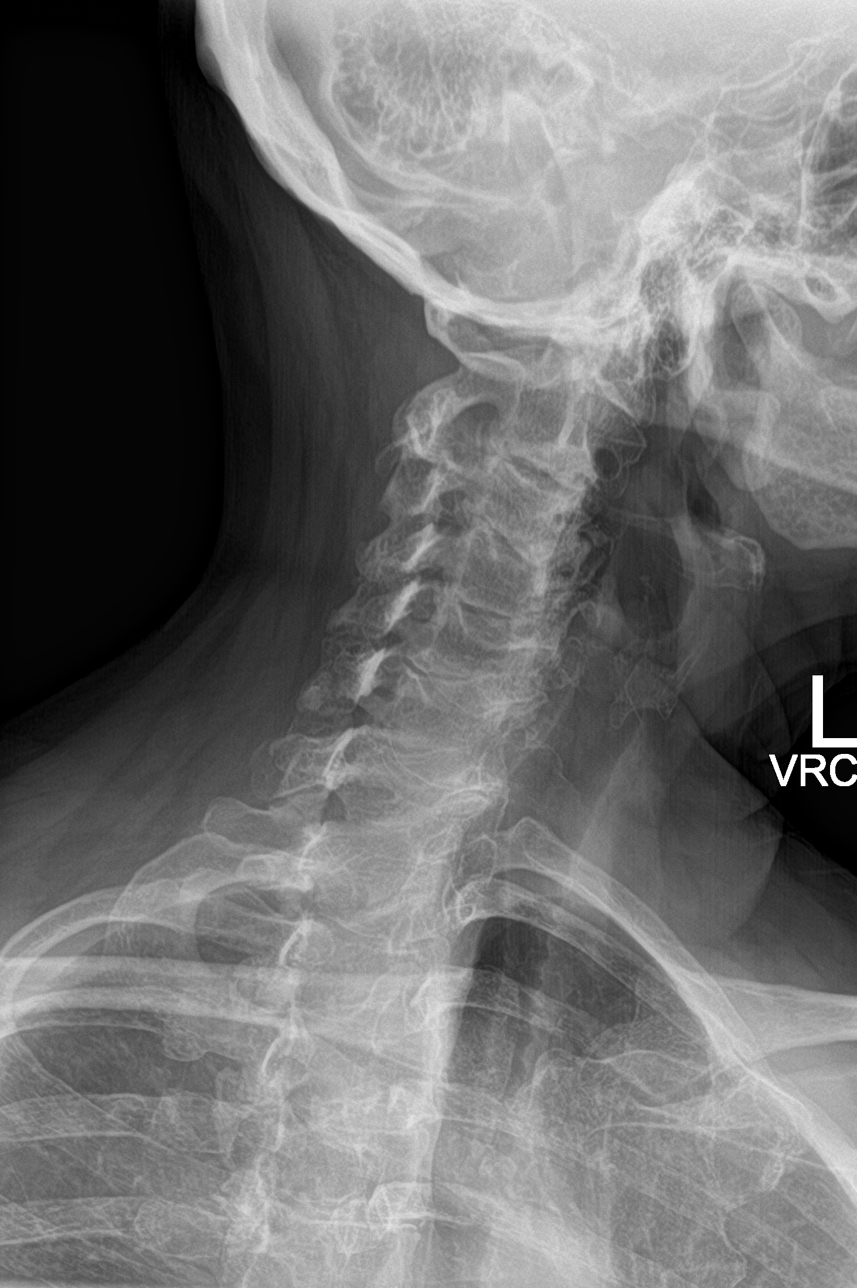

[c-spine ap]
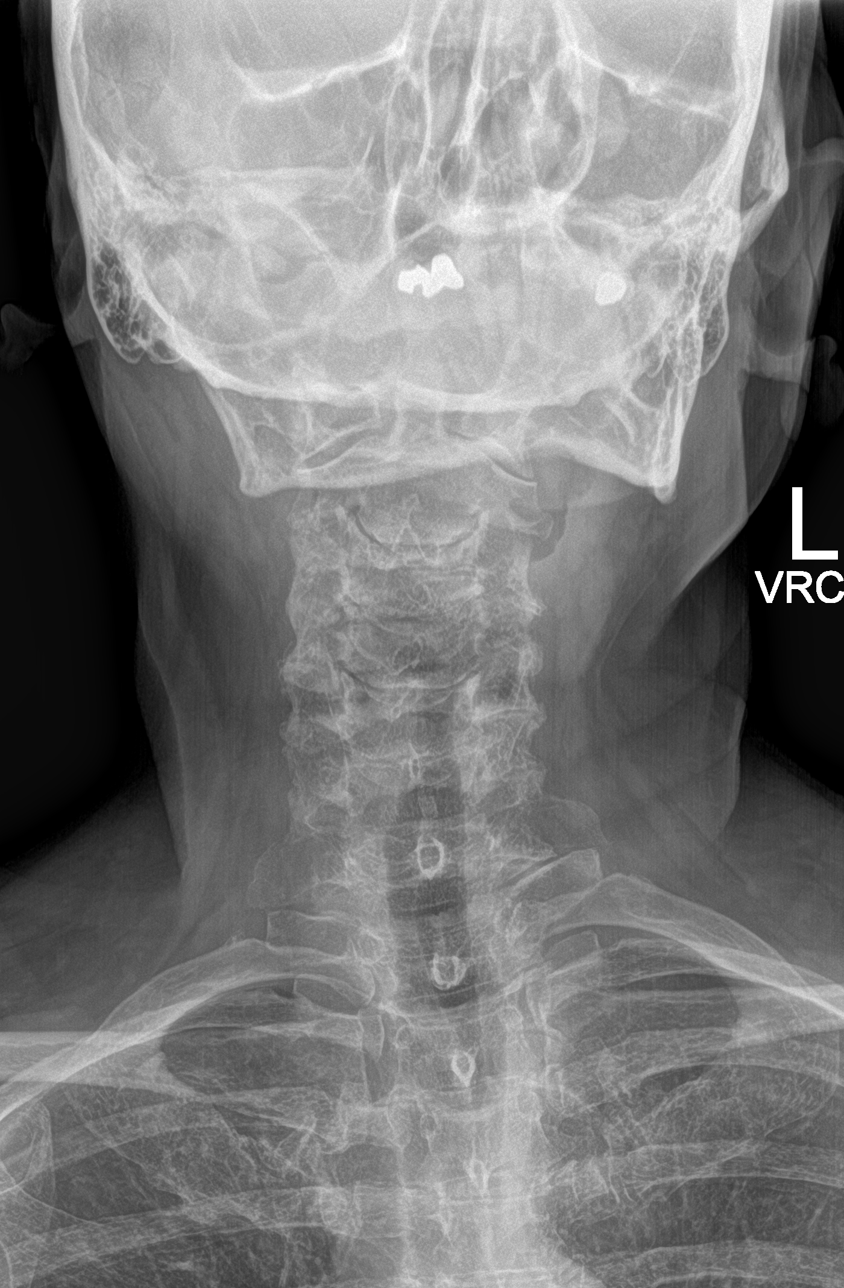

[c-spine open mouth]
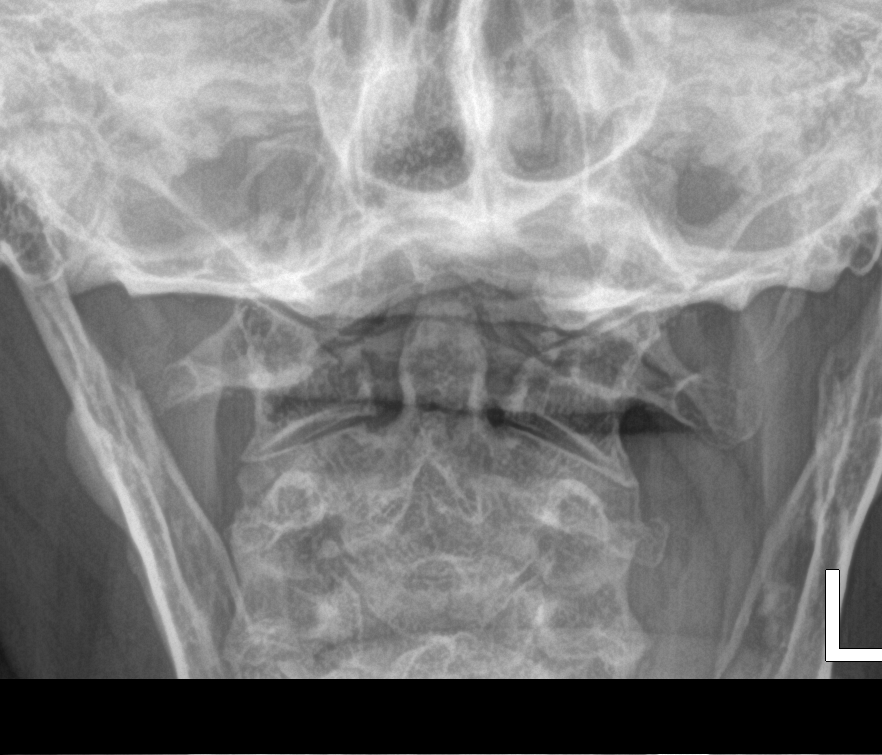

[5 of 5 positions shown; findings below may reference images not displayed]

FINDINGS: Frontal, bilateral oblique, lateral views of the cervical spine are
obtained. There is mild right convex scoliosis. Minimal
retrolisthesis of C2 on C3 and C3 on C4 likely due to degenerative
change. There is multilevel spondylosis and facet hypertrophy,
greatest at C2-3, C3-4, and C6-7. There is symmetrical neural
foraminal encroachment from C3-4 through C5-6. No acute fractures.
Prevertebral soft tissues are unremarkable. Lung apices are clear.
IMPRESSION: 1. Multilevel spondylosis and facet hypertrophy, greatest at C2-3
and C3-4.
2. Bilateral neural foraminal encroachment from C3-4 through C5-6.

## 2021-12-08 NOTE — Patient Instructions (Signed)
Cervical Radiculopathy ?Cervical radiculopathy happens when a nerve in the neck (a cervical nerve) is pinched or bruised. This condition can happen because of an injury to the cervical spine (vertebrae) in the neck, or as part of the normal aging process. Pressure on the cervical nerves can cause pain or numbness that travels from the neck all the way down to the arm and fingers. This condition usually gets better with rest. Treatment may be needed if the condition does not improve. ?What are the causes? ?This condition may be caused by: ?A neck injury. ?A bulging (herniated) disk. ?Muscle spasms. ?Muscle tightness in the neck due to overuse. ?Arthritis. ?Breakdown or degeneration in the bones and joints of the spine (spondylosis) due to aging. ?Bone spurs that may develop near the cervical nerves. ?What are the signs or symptoms? ?Symptoms of this condition include: ?Pain. The pain may travel from the neck to the arm and hand. The pain can be severe or irritating. It may get worse when you move your neck. ?Numbness or tingling in your arm or hand. ?Weakness in the affected arm and hand, in severe cases. ?How is this diagnosed? ?This condition may be diagnosed based on your symptoms, your medical history, and a physical exam. You may also have tests, including: ?X-rays. ?CT scan. ?MRI. ?Electromyogram (EMG). ?Nerve conduction tests. ?How is this treated? ?In many cases, treatment is not needed for this condition. With rest, the condition usually gets better over time. If treatment is needed, options may include: ?Wearing a soft neck collar (cervical collar) for short periods of time. ?Doing physical therapy to strengthen your neck muscles. ?Taking medicines. These may include NSAIDs, such as ibuprofen, or oral corticosteroids. ?Having spinal injections, in severe cases. ?Having surgery. This may be needed if other treatments do not help. Different types of surgery may be done depending on the cause of this  condition. ?Follow these instructions at home: ?If you have a cervical collar: ?Wear it as told by your health care provider. Remove it only as told by your health care provider. ?Ask your health care provider if you can remove the cervical collar for cleaning and bathing. If you are allowed to remove the collar for cleaning or bathing: ?Follow instructions from your health care provider about how to remove the collar safely. ?Clean the collar by wiping it with mild soap and water and drying it completely. ?Take out any removable pads in the collar every 1-2 days, and wash them by hand with soap and water. Let them air-dry completely before you put them back in the collar. ?Check your skin under the collar for irritation or sores. If you see any, tell your health care provider. ?Managing pain ?  ?Take over-the-counter and prescription medicines only as told by your health care provider. ?If directed, put ice on the affected area. To do this: ?If you have a soft neck collar, remove it as told by your health care provider. ?Put ice in a plastic bag. ?Place a towel between your skin and the bag. ?Leave the ice on for 20 minutes, 2-3 times a day. ?Remove the ice if your skin turns bright red. This is very important. If you cannot feel pain, heat, or cold, you have a greater risk of damage to the area. ?If applying ice does not help, you can try using heat. Use the heat source that your health care provider recommends, such as a moist heat pack or a heating pad. ?Place a towel between your skin and  the heat source. ?Leave the heat on for 20-30 minutes. ?Remove the heat if your skin turns bright red. This is especially important if you are unable to feel pain, heat, or cold. You have a greater risk of getting burned. ?Try a gentle neck and shoulder massage to help relieve symptoms. ?Activity ?Rest as needed. ?Return to your normal activities as told by your health care provider. Ask your health care provider what  activities are safe for you. ?Do stretching and strengthening exercises as told by your health care provider or your physical therapist. ?You may have to avoid lifting. Ask your health care provider how much you can safely lift. ?General instructions ?Use a flat pillow when you sleep. ?Do not drive while wearing a cervical collar. If you do not have a cervical collar, ask your health care provider if it is safe to drive while your neck heals. ?Ask your health care provider if the medicine prescribed to you requires you to avoid driving or using machinery. ?Do not use any products that contain nicotine or tobacco. These products include cigarettes, chewing tobacco, and vaping devices, such as e-cigarettes. If you need help quitting, ask your health care provider. ?Keep all follow-up visits. This is important. ?Contact a health care provider if: ?Your condition does not improve with treatment. ?Get help right away if: ?Your pain gets much worse and is not controlled with medicines. ?You have weakness or numbness in your hand, arm, face, or leg. ?You have a high fever. ?You have a stiff, rigid neck. ?You lose control of your bowels or your bladder (have incontinence). ?You have trouble with walking, balance, or speaking. ?Summary ?Cervical radiculopathy happens when a nerve in the neck is pinched or bruised. ?A nerve can get pinched from a bulging disk, arthritis, muscle spasms, or an injury to the neck. ?Symptoms include pain, tingling, or numbness radiating from the neck to the arm or hand. Weakness can also occur in severe cases. ?Treatment may include rest, wearing a cervical collar, and physical therapy. Medicines may be prescribed to help with pain. In severe cases, injections or surgery may be needed. ?This information is not intended to replace advice given to you by your health care provider. Make sure you discuss any questions you have with your health care provider. ?Document Revised: 02/26/2021 Document  Reviewed: 02/26/2021 ?Elsevier Patient Education ? [employer]. ? ?

## 2021-12-08 NOTE — Progress Notes (Signed)
? ?Subjective:  ? ?By signing my name below, I, [name], attest that this documentation has been prepared under the direction and in the presence of Ann Held, DO  12/08/2021 ?  ? ? Patient ID: [name], female    DOB: 11/01/1955, 66 y.o.   MRN: [id number] ? ?Chief Complaint  ?Patient presents with  ? Neck Pain  ?  Pt states having neck pain and numbness for a few months and states numbness and comes and goes.   ? ? ?Neck Pain  ?Associated symptoms include tingling (chin). Pertinent negatives include no chest pain, fever or headaches.  ?Patient is in today for a office visit.  ? ?She complains of a numbness in her chin that comes and goes. She recently had an infection in her teeth. She has an upcoming appointment with her dentist to have her teeth pulled and is getting dentures. She is planning on mentioning her symptoms to them.  ? ?She also complains of a neck pain after walking for long periods. She has no prior injuries to her neck. She has no pain while driving and or sitting. She finds her neck pain typically radiates to her shoulder. She thinks her posture may be contributing to her symptoms. While sitting she typically rests her head on her arm. ?She reports yesterday she slipped in the bathroom and feel. ? ? ?Past Medical History:  ?Diagnosis Date  ? Abdominal pain   ? Hx  ? Allergy   ? Anal stenosis   ? Anemia   ? Anxiety   ? Arthritis   ? Asthma   ? patient does not have inhaler  ? Blood in stool   ? Hx  ? Blood in urine   ? Hx  ? Blood transfusion without reported diagnosis   ? Cataract   ? CKD (chronic kidney disease) stage 3, GFR 30-59 ml/min (HCC)   ? Crohn's colitis, other complication ([place])   ? De Quervain's tenosynovitis   ? Depression   ? Difficulty urinating   ? Hx  ? Easy bruising   ? Esophagitis   ? Fainting   ? History - resolved - due to dehydration  ? Fatigue   ? Hx  ? Fibroid   ? Hx  ? Gastric polyp   ? GERD (gastroesophageal reflux disease)   ? Hearing loss   ?  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  ? Hemorrhoids, external   ? Hemorrhoids, internal   ? Herpes, genital   ? vaginal treated 07/05/12 and pt states is resolved  ? History of cervical dysplasia   ? History of small bowel obstruction   ? Hyperlipidemia   ? currently no meds  ? Hyperparathyroidism   ? Hypertension   ? Hypokalemia   ? Hx  ? IBD (inflammatory bowel disease)   ? 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 Obesity   ? Ovarian cyst   ? Poor dental hygiene   ? Pulmonary nodule, right   ? right upper lobe  ? Rectal bleeding   ? Hx  ? Rectal pain   ? Hx  ? Renal insufficiency   ? CKD - stage 3  ? RLS (restless legs syndrome)   ? no meds  ? Tooth infection 11/2016  ? right low  ? Ulcerative colitis   ? Visual disturbance   ? wears glasses  ? Weakness generalized   ?  Hx - patient denies generalized weakness  ? Wears dentures   ? upper only  ? ? ?Past Surgical History:  ?Procedure Laterality Date  ? ANAL DILATION    ? CERVICAL BIOPSY  W/ LOOP ELECTRODE EXCISION    ? CHOLECYSTECTOMY    ? COLONOSCOPY    ? [name]  ? fatty tumor removed from back    ? X 2  ? HEMORRHOID SURGERY    ? ILEOSTOMY CLOSURE    ? RESTORATIVE PROCTOCOLECTOMY    ? with insertion of ileoanal J Pouch with loop ileostomy  ? SHOULDER ARTHROSCOPY WITH CAPSULORRHAPHY Left 06/14/2019  ? Procedure: LEFT SHOULDER ARTHRSCOPIC REPAIR OF BONY BANKART FRACTURE;  Surgeon: Tania Ade, MD;  Location: [facility];  Service: Orthopedics;  Laterality: Left;  REQUEST 90 MINUTE  ? SIGMOIDOSCOPY    ? TOTAL ABDOMINAL HYSTERECTOMY  1998  ? TAH/LSO  ? TUBAL LIGATION    ? UPPER GASTROINTESTINAL ENDOSCOPY    ? Olevia Perches  ? ? ?Family History  ?Problem Relation Age of Onset  ? Ulcerative colitis Father   ? Hypertension Father   ? Heart attack Father   ? Hypertension Mother   ? Heart disease Mother   ?     s/p pci  ? Ulcerative colitis Daughter   ? Irritable bowel syndrome Other   ?     grandchildren  ? Diabetes Sister   ?  Cancer Sister   ?     uterine  ? Cancer Maternal Uncle   ?     LUNG  ? Colon cancer Neg Hx   ? Esophageal cancer Neg Hx   ? Stomach cancer Neg Hx   ? Rectal cancer Neg Hx   ? ? ?Social History  ? ?Socioeconomic History  ? Marital status: Divorced  ?  Spouse name: Not on file  ? Number of children: Not on file  ? Years of education: Not on file  ? Highest education level: Not on file  ?Occupational History  ? Not on file  ?Tobacco Use  ? Smoking status: Former  ?  Packs/day: 1.00  ?  Years: 4.00  ?  Pack years: 4.00  ?  Types: Cigarettes  ?  Quit date: 05/21/1979  ?  Years since quitting: 42.5  ? Smokeless tobacco: Never  ?Vaping Use  ? Vaping Use: Never used  ?Substance and Sexual Activity  ? Alcohol use: No  ?  Alcohol/week: 0.0 standard drinks  ? Drug use: No  ? Sexual activity: Yes  ?  Birth control/protection: Post-menopausal  ?  Comment: 1st intercourse 66 yo-Fewer than 5 partners  ?Other Topics Concern  ? Not on file  ?Social History Narrative  ? Not on file  ? ?Social Determinants of Health  ? ?Financial Resource Strain: Not on file  ?Food Insecurity: Not on file  ?Transportation Needs: Not on file  ?Physical Activity: Not on file  ?Stress: Not on file  ?Social Connections: Not on file  ?Intimate Partner Violence: Not on file  ? ? ?Outpatient Medications Prior to Visit  ?Medication Sig Dispense Refill  ? acetaminophen (TYLENOL) 500 MG tablet Take 500-1,000 mg by mouth at bedtime.    ? allopurinol (ZYLOPRIM) 300 MG tablet TAKE 1 TABLET BY MOUTH EVERY DAY 90 tablet 0  ? Calcium Carbonate Antacid (TUMS EXTRA STRENGTH 750 PO) Take 1-2 tablets by mouth See admin instructions. Chew 1-2 tablets by mouth as needed for heartburn or reflux    ? DULoxetine (CYMBALTA) 60 MG capsule TAKE 2  CAPSULES BY MOUTH DAILY 60 capsule 0  ? famotidine (PEPCID) 20 MG tablet TAKE 1 TABLET BY MOUTH TWICE A DAY (Patient taking differently: Take 20 mg by mouth daily as needed for heartburn or indigestion.) 180 tablet 1  ? folic acid  (FOLVITE) 1 MG tablet Take 2 tablets (2 mg total) by mouth daily. (Patient taking differently: Take 1-2 mg by mouth daily.) 180 tablet 3  ? metoprolol succinate (TOPROL-XL) 100 MG 24 hr tablet Take 50-100 mg by mouth See admin instructions. Take 100 mg by mouth in the morning and 50 mg at bedtime    ? Vitamin D, Ergocalciferol, (DRISDOL) 1.25 MG (50000 UNIT) CAPS capsule Take 50,000 Units by mouth every 30 (thirty) days.    ? amoxicillin-clavulanate (AUGMENTIN) 875-125 MG tablet Take 1 tablet by mouth 2 (two) times daily. 20 tablet 0  ? ?No facility-administered medications prior to visit.  ? ? ?Allergies  ?Allergen Reactions  ? Sulfa Antibiotics Hives, Itching, Rash and Swelling  ? Morphine Other (See Comments)  ?  "Gives me crazy dreams" (delusions, also)  ? Sulfonamide Derivatives Hives, Itching and Swelling  ? ? ?Review of Systems  ?Constitutional:  Negative for fever and malaise/fatigue.  ?HENT:  Negative for congestion.   ?Eyes:  Negative for blurred vision.  ?Respiratory:  Negative for shortness of breath.   ?Cardiovascular:  Negative for chest pain, palpitations and leg swelling.  ?Gastrointestinal:  Negative for abdominal pain, blood in stool and nausea.  ?Genitourinary:  Negative for dysuria and frequency.  ?Musculoskeletal:  Positive for falls and neck pain.  ?Skin:  Negative for rash.  ?Neurological:  Positive for tingling (chin). Negative for dizziness, loss of consciousness and headaches.  ?Endo/Heme/Allergies:  Negative for environmental allergies.  ?Psychiatric/Behavioral:  Negative for depression. The patient is not nervous/anxious.   ? ?   ?Objective:  ?  ?Physical Exam ?Vitals and nursing note reviewed.  ?Constitutional:   ?   General: She is not in acute distress. ?   Appearance: Normal appearance. She is not ill-appearing.  ?HENT:  ?   Head: Normocephalic and atraumatic.  ?   Right Ear: External ear normal.  ?   Left Ear: External ear normal.  ?Eyes:  ?   Extraocular Movements: Extraocular  movements intact.  ?   Pupils: Pupils are equal, round, and reactive to light.  ?Cardiovascular:  ?   Rate and Rhythm: Normal rate and regular rhythm.  ?   Heart sounds: Normal heart sounds. No murmur heard. ?  No

## 2021-12-08 NOTE — Assessment & Plan Note (Signed)
Check xray  ?She did not want muscle relaxer  ?Moist heat  ?Massage, stretch ? ?

## 2021-12-09 ENCOUNTER — Telehealth: Payer: Self-pay

## 2021-12-09 NOTE — Telephone Encounter (Signed)
Patient advised of provider's comments of results and copy of labs faxed to [facility]. She denies nausea, vomiting, weakness or severe pain. Advised of she develops any symptoms to call back or seek in person evaluation. ?

## 2021-12-09 NOTE — Telephone Encounter (Signed)
Creatinine 3.22, previous creatinines varies. ?Chart reviewed. ?patient sees nephrology, last documented visit 11/06/2021: She is a stage G4/A2 with a GFR between 15 and 29. ?Plan: Advised patient creatinine is slightly higher than before, if she does not have symptoms of kidney failure (nausea, vomiting, weakness, severe fatigue) okay to wait for PCP comments. ?Also, please send results to nephrology. ?

## 2021-12-09 NOTE — Telephone Encounter (Signed)
Nurse Assessment ?Nurse: Lissa Merlin, RN, Abigail Date/Time Eilene Ghazi Time): 12/08/2021 5:18:36 PM ?Is there an on-call provider listed? ---Yes ?Please list name of person reporting value (Lab ?Employee) and a contact number. ---[phone] [id number] ?Please document the following items: Lab name Lab ?value (read back to lab to verify) Reference range ?for lab value Date and time blood was drawn Collect ?time of birth for bilirubin results ?---Lab: GFR Value: 14.28 Reference Range: Greater ?than 60 under 15 critical Date and time: 12/08/21 1107 ?Recent Value: 05/15/21 10.27 ?Please collect the patient contact information from ?the lab. (name, phone number and address) ---[phone] ?List any special notes provided by lab. ---None ?Disp. Time (Eastern ?Time) Disposition Final User ?12/08/2021 5:27:25 PM Called On-Call Provider Lissa Merlin, RN, [place] ?12/08/2021 5:30:47 PM Attempt made - message left Lissa Merlin, RN, Abigail ?12/08/2021 5:42:38 PM Attempt made - message left Lissa Merlin, RN, Abigail ?12/08/2021 5:53:03 PM Called On-Call Provider Lissa Merlin, RN, [place] ?12/08/2021 5:53:55 PM Clinical Call Yes Lissa Merlin, RN, Vernie Shanks ?Comments ?User: Tildon Husky, RN Date/Time Eilene Ghazi Time): 12/08/2021 5:52:18 PM ?PLEASE NOTE: All timestamps contained within this report are represented as [place] Standard Time. ?CONFIDENTIALTY NOTICE: This fax transmission is intended only for the addressee. It contains information that is legally privileged, confidential or ?otherwise protected from use or disclosure. If you are not the intended recipient, you are strictly prohibited from reviewing, disclosing, copying using ?or disseminating any of this information or taking any action in reliance on or regarding this information. If you have received this fax in error, please ?notify us immediately by telephone so that we can arrange for its return to [place]. Phone: [phone], Toll-Free: [phone], Fax: [phone] ?Page: 2 of 2 ?Call Id: [id number] ?Comments ?Message left  to patient asking her to call office in the morning. Unable to reach patient. ?Paging ?DoctorName Phone DateTime Result/ ?Outcome Message Type Notes ?Deborra Medina - MD [phone] ?12/08/2021 ?5:27:25 ?PM ?Called On ?Call Provider - ?Reached ?Doctor Paged ?Deborra Medina - MD ?12/08/2021 ?5:29:47 ?PM ?Spoke with On ?Call - General Message Result ?Took the result. Reports it is ?a nerphology patient. Call and ?check that the patient feels okay ?and that she has an appointment ?with her nephrologist in the next ?month. If she does not have an ?appointment then have her call ?the office in the morning to make ?an appointment perferably in the ?next month. Deborra Medina - MD [phone] ?12/08/2021 ?5:53:03 ?PM ?Called On ?Call Provider - ?Reached ?Doctor Paged ?Deborra Medina - MD ?12/08/2021 ?5:53:47 ?PM ?Spoke with On ?Call - General Message Result ?Notified provider that this nurse ?was unable to reach the provider ?and that a voicemail was left ?asking the patient to call the ?office in the morning to be able ?to make sure that a follow up ?appointment is made ?

## 2021-12-11 ENCOUNTER — Other Ambulatory Visit: Payer: Self-pay | Admitting: Family Medicine

## 2021-12-11 ENCOUNTER — Telehealth (HOSPITAL_BASED_OUTPATIENT_CLINIC_OR_DEPARTMENT_OTHER): Payer: Self-pay

## 2021-12-11 DIAGNOSIS — M5030 Other cervical disc degeneration, unspecified cervical region: Secondary | ICD-10-CM

## 2021-12-14 ENCOUNTER — Telehealth (HOSPITAL_BASED_OUTPATIENT_CLINIC_OR_DEPARTMENT_OTHER): Payer: Self-pay

## 2021-12-15 ENCOUNTER — Telehealth: Payer: Self-pay | Admitting: Family Medicine

## 2021-12-15 ENCOUNTER — Other Ambulatory Visit: Payer: Self-pay | Admitting: *Deleted

## 2021-12-15 DIAGNOSIS — F322 Major depressive disorder, single episode, severe without psychotic features: Secondary | ICD-10-CM

## 2021-12-15 MED ORDER — ALLOPURINOL 300 MG PO TABS: 300.0000 mg | ORAL_TABLET | Freq: Every day | ORAL | 1 refills | Status: DC

## 2021-12-15 MED ORDER — DULOXETINE HCL 60 MG PO CPEP: 120.0000 mg | ORAL_CAPSULE | Freq: Every day | ORAL | 1 refills | Status: DC

## 2021-12-15 NOTE — Telephone Encounter (Signed)
[facility] stated they faxed over a request to be pt's main pharmacy and are needing her current meds. Stated they faxed this over 4/5 and 4/8. Please advise.  ?

## 2021-12-15 NOTE — Telephone Encounter (Signed)
Rxs have been sent in. 

## 2021-12-17 ENCOUNTER — Telehealth (HOSPITAL_BASED_OUTPATIENT_CLINIC_OR_DEPARTMENT_OTHER): Payer: Self-pay

## 2021-12-23 ENCOUNTER — Other Ambulatory Visit: Payer: Self-pay | Admitting: Family Medicine

## 2021-12-23 DIAGNOSIS — F322 Major depressive disorder, single episode, severe without psychotic features: Secondary | ICD-10-CM

## 2021-12-26 ENCOUNTER — Ambulatory Visit (HOSPITAL_BASED_OUTPATIENT_CLINIC_OR_DEPARTMENT_OTHER): Payer: Medicare Other

## 2021-12-30 ENCOUNTER — Telehealth (HOSPITAL_BASED_OUTPATIENT_CLINIC_OR_DEPARTMENT_OTHER): Payer: Self-pay

## 2022-01-04 DIAGNOSIS — Z932 Ileostomy status: Secondary | ICD-10-CM | POA: Diagnosis not present

## 2022-01-06 ENCOUNTER — Ambulatory Visit (HOSPITAL_BASED_OUTPATIENT_CLINIC_OR_DEPARTMENT_OTHER)
Admission: RE | Admit: 2022-01-06 | Discharge: 2022-01-06 | Disposition: A | Discharge status: Home / Self Care | Payer: Medicare Other | Source: Ambulatory Visit | Attending: Family Medicine | Admitting: Family Medicine

## 2022-01-06 DIAGNOSIS — M419 Scoliosis, unspecified: Secondary | ICD-10-CM | POA: Diagnosis not present

## 2022-01-06 DIAGNOSIS — M4802 Spinal stenosis, cervical region: Secondary | ICD-10-CM | POA: Diagnosis not present

## 2022-01-06 DIAGNOSIS — M50221 Other cervical disc displacement at C4-C5 level: Secondary | ICD-10-CM | POA: Diagnosis not present

## 2022-01-06 DIAGNOSIS — M5021 Other cervical disc displacement,  high cervical region: Secondary | ICD-10-CM | POA: Diagnosis not present

## 2022-01-06 DIAGNOSIS — M5030 Other cervical disc degeneration, unspecified cervical region: Secondary | ICD-10-CM | POA: Insufficient documentation

## 2022-01-06 IMAGING — MR MR CERVICAL SPINE W/O CM
5 series · 41 of 48 positions shown · non-contrast
Comparison: Radiography [DATE]

CLINICAL DATA: Neck and shoulder pain for several months with
degenerative changes by x-ray.

EXAM:
MRI CERVICAL SPINE WITHOUT CONTRAST
TECHNIQUE: Multiplanar, multisequence MR imaging of the cervical spine was
performed. No intravenous contrast was administered.

[Series 2: T2 · sagittal · 3.0mm · 0.86mm/px · 6 of 13 slices shown (1 of 2)]
[im 1/13]
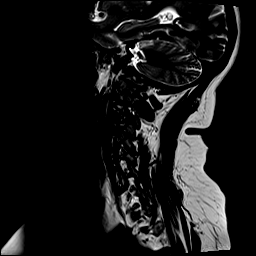
[im 3/13]
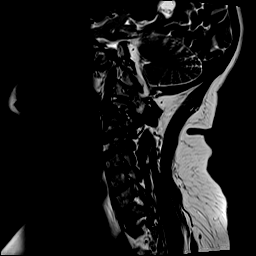
[im 5/13]
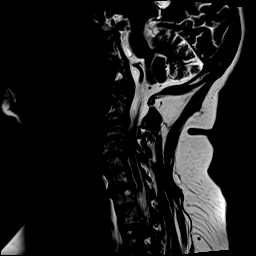
[im 8/13]
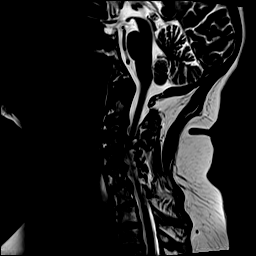
[im 10/13]
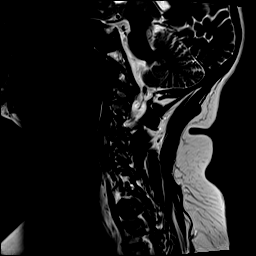
[im 13/13]
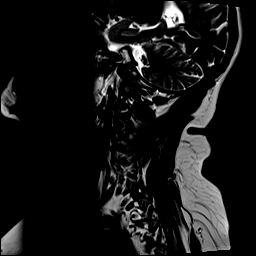

[Series 3: T1 · sagittal · 3.0mm · 0.86mm/px · 6 of 13 slices shown]
[im 1/13]
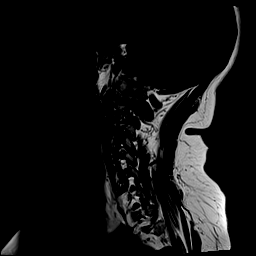
[im 3/13]
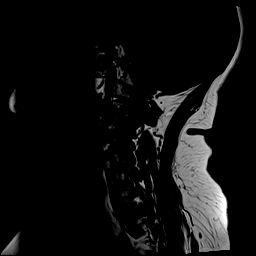
[im 5/13]
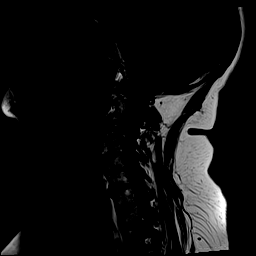
[im 8/13]
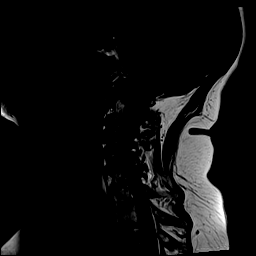
[im 10/13]
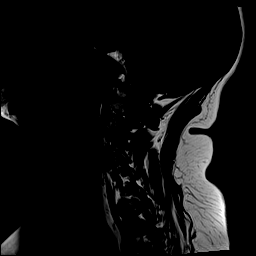
[im 13/13]
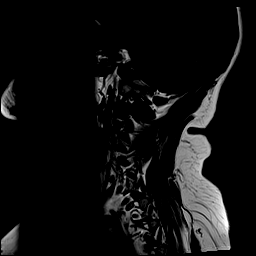

[Series 4: STIR · sagittal · 3.0mm · 0.69mm/px · 6 of 13 slices shown]
[im 1/13]
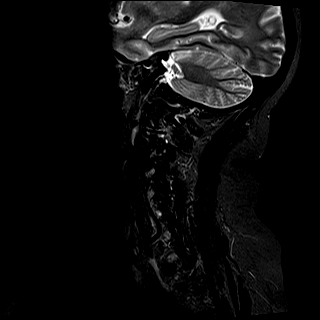
[im 3/13]
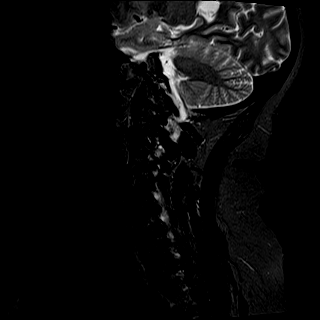
[im 5/13]
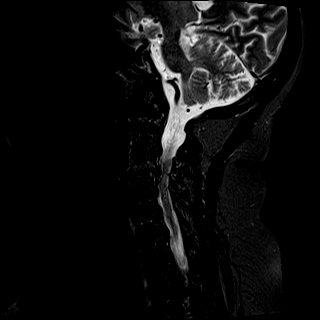
[im 8/13]
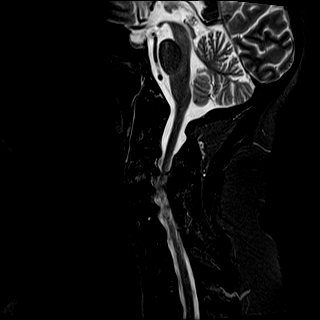
[im 10/13]
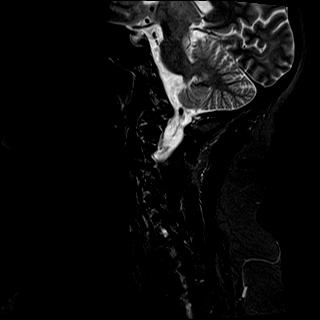
[im 13/13]
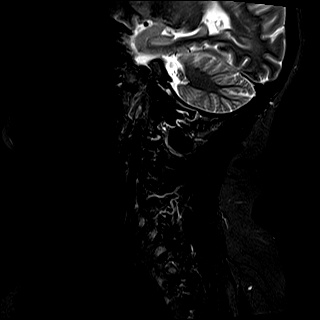

[Series 5: T2 · axial · 3.0mm · 0.70mm/px · z∈[-127,-14]mm · 15 of 34 slices shown (2 of 2)]
[im 1/34]
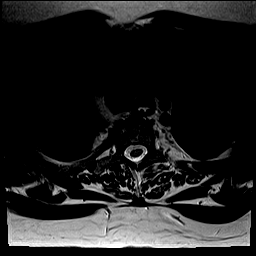
[im 3/34]
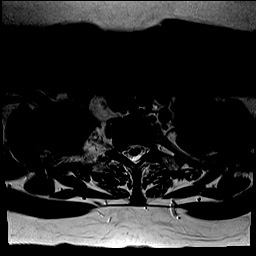
[im 5/34]
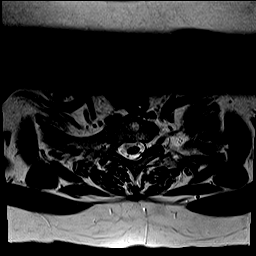
[im 8/34]
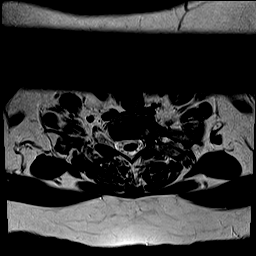
[im 10/34]
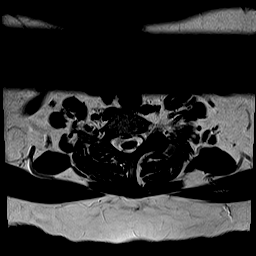
[im 12/34]
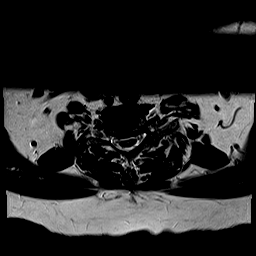
[im 15/34]
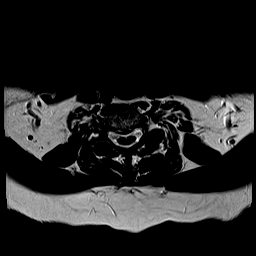
[im 17/34]
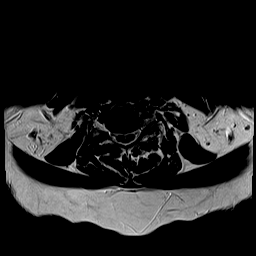
[im 19/34]
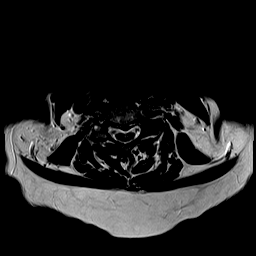
[im 22/34]
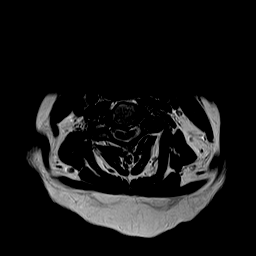
[im 24/34]
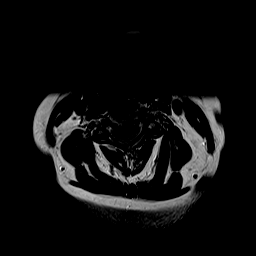
[im 26/34]
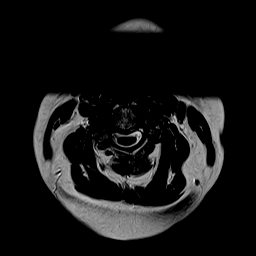
[im 29/34]
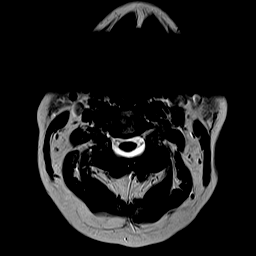
[im 31/34]
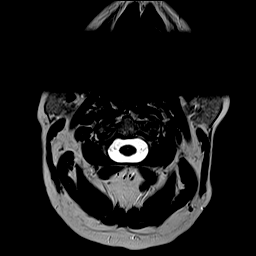
[im 34/34]
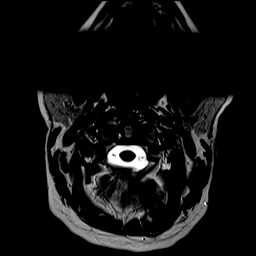

[Series 6: ax mpgr · axial · 3.0mm · 0.35mm/px · z∈[-127,-14]mm · 8 of 34 slices shown]
[im 1/34]
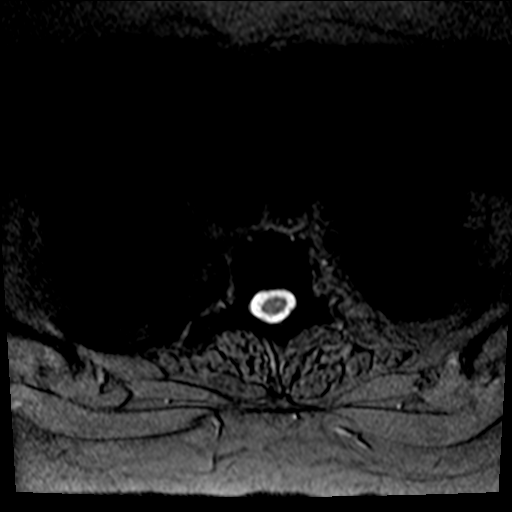
[im 5/34]
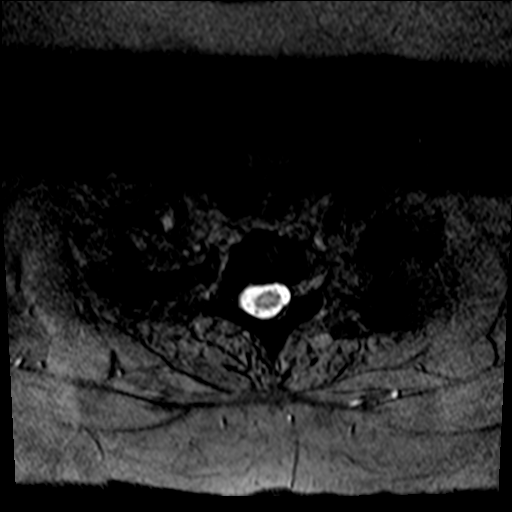
[im 10/34]
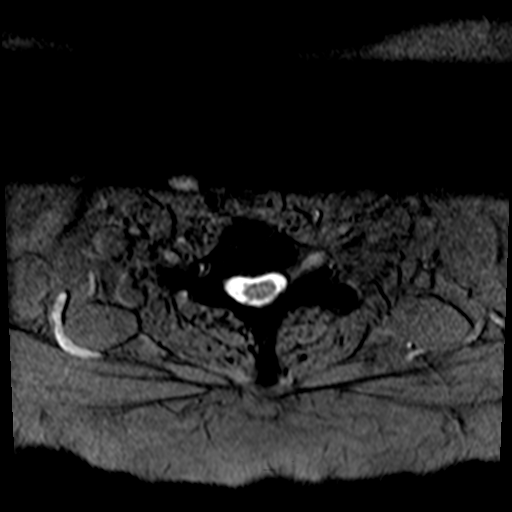
[im 15/34]
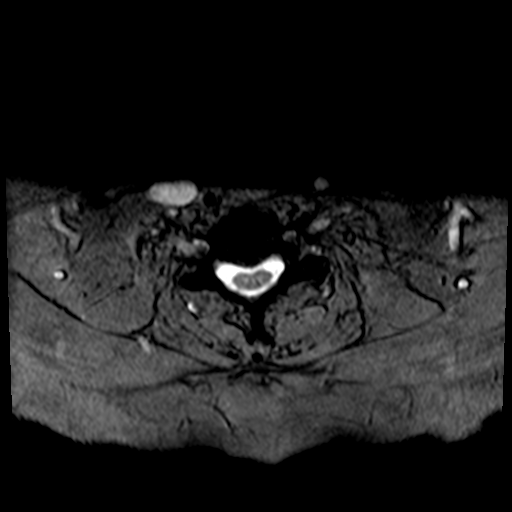
[im 19/34]
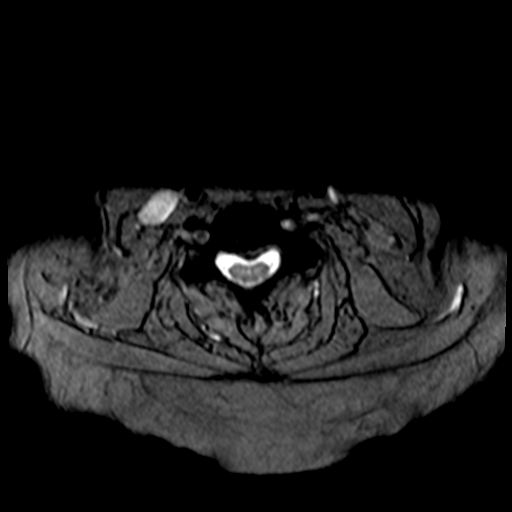
[im 24/34]
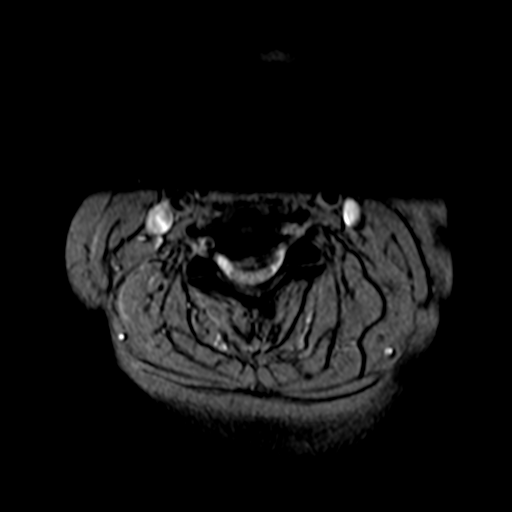
[im 29/34]
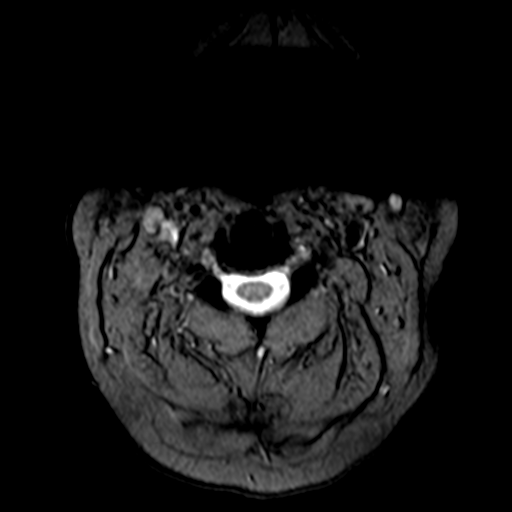
[im 34/34]
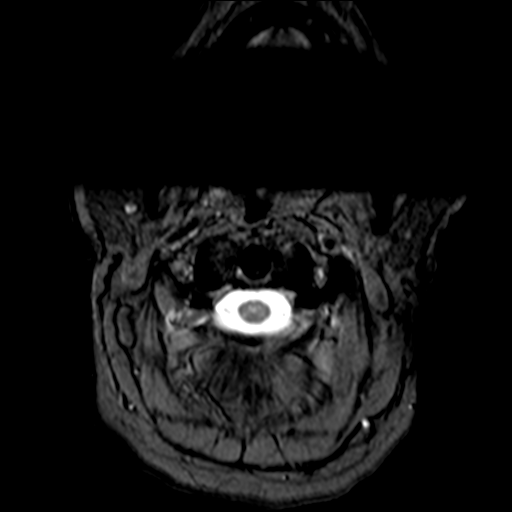

[41 of 48 positions shown; findings below may reference images not displayed]

FINDINGS: Alignment: Mild dextrocurvature.  No listhesis.

Vertebrae: No fracture, evidence of discitis, or bone lesion. Mild
discogenic endplate edema about C3-4

Cord: Degenerative cord flattening described below.  No cord edema.

Posterior Fossa, vertebral arteries, paraspinal tissues: Negative
for perispinal mass or inflammation

Disc levels:

C2-3: Disc narrowing and bulging with central protrusion and
ligamentum flavum thickening. Negative facets. No neural compression

C3-4: Disc narrowing and bulging with uncovertebral and endplate
spurring. Ligamentum flavum buckling. Spinal stenosis with cord
flattening. Biforaminal impingement

C4-5: Disc narrowing and bulging with central protrusion flattening
the cord. Asymmetric left uncovertebral ridging. Left foraminal
impingement.

C5-6: Disc narrowing and bulging with greater left uncovertebral
spurring. Moderate left foraminal stenosis.

C6-7: Disc narrowing and bulging.  No neural compression

C7-T1:Left facet spurring.  No neural impingement
IMPRESSION: 1. Diffuse, advanced cervical spine degeneration with mild
dextroscoliosis.
2. Spinal stenosis with cord flattening at C3-4 and C4-5.
3. Foraminal impingement bilaterally at C3-4 and on the left at
C4-5, C5-6.

## 2022-01-07 ENCOUNTER — Other Ambulatory Visit: Payer: Self-pay

## 2022-01-07 DIAGNOSIS — M4800 Spinal stenosis, site unspecified: Secondary | ICD-10-CM

## 2022-01-07 DIAGNOSIS — M5030 Other cervical disc degeneration, unspecified cervical region: Secondary | ICD-10-CM

## 2022-01-20 DIAGNOSIS — N184 Chronic kidney disease, stage 4 (severe): Secondary | ICD-10-CM | POA: Diagnosis not present

## 2022-01-20 DIAGNOSIS — R768 Other specified abnormal immunological findings in serum: Secondary | ICD-10-CM | POA: Diagnosis not present

## 2022-01-20 DIAGNOSIS — N179 Acute kidney failure, unspecified: Secondary | ICD-10-CM | POA: Diagnosis not present

## 2022-01-20 DIAGNOSIS — D631 Anemia in chronic kidney disease: Secondary | ICD-10-CM | POA: Diagnosis not present

## 2022-01-20 DIAGNOSIS — N2581 Secondary hyperparathyroidism of renal origin: Secondary | ICD-10-CM | POA: Diagnosis not present

## 2022-01-20 DIAGNOSIS — D509 Iron deficiency anemia, unspecified: Secondary | ICD-10-CM | POA: Diagnosis not present

## 2022-01-20 DIAGNOSIS — N390 Urinary tract infection, site not specified: Secondary | ICD-10-CM | POA: Diagnosis not present

## 2022-01-20 DIAGNOSIS — E871 Hypo-osmolality and hyponatremia: Secondary | ICD-10-CM | POA: Diagnosis not present

## 2022-01-20 DIAGNOSIS — M109 Gout, unspecified: Secondary | ICD-10-CM | POA: Diagnosis not present

## 2022-01-20 DIAGNOSIS — I129 Hypertensive chronic kidney disease with stage 1 through stage 4 chronic kidney disease, or unspecified chronic kidney disease: Secondary | ICD-10-CM | POA: Diagnosis not present

## 2022-01-20 DIAGNOSIS — E876 Hypokalemia: Secondary | ICD-10-CM | POA: Diagnosis not present

## 2022-01-25 ENCOUNTER — Ambulatory Visit (INDEPENDENT_AMBULATORY_CARE_PROVIDER_SITE_OTHER): Payer: Medicare Other | Admitting: Gastroenterology

## 2022-01-25 ENCOUNTER — Encounter: Payer: Self-pay | Admitting: Gastroenterology

## 2022-01-25 ENCOUNTER — Other Ambulatory Visit: Payer: Medicare Other

## 2022-01-25 VITALS — BP 120/80 | HR 76 | Ht 62.0 in | Wt 174.6 lb

## 2022-01-25 DIAGNOSIS — Z932 Ileostomy status: Secondary | ICD-10-CM

## 2022-01-25 DIAGNOSIS — K50819 Crohn's disease of both small and large intestine with unspecified complications: Secondary | ICD-10-CM | POA: Diagnosis not present

## 2022-01-25 NOTE — Patient Instructions (Addendum)
If you are age 68 or older, your body mass index should be between 23-30. Your Body mass index is 31.93 kg/m. If this is out of the aforementioned range listed, please consider follow up with your Primary Care Provider.  If you are age 52 or younger, your body mass index should be between 19-25. Your Body mass index is 31.93 kg/m. If this is out of the aformentioned range listed, please consider follow up with your Primary Care Provider.   ________________________________________________________  The [facility] providers would like to encourage you to use [facility] to communicate with providers for non-urgent requests or questions.  Due to long hold times on the telephone, sending your provider a message by [facility] may be a faster and more efficient way to get a response.  Please allow 48 business hours for a response.  Please remember that this is for non-urgent requests.  _______________________________________________________  Please go to the lab in the basement of our building to have lab work done as you leave today. Hit "B" for basement when you get on the elevator.  When the doors open the lab is on your left.  We will call you with the results. Thank you.  Please have your pharmacy,  [facility], send a refill request for your ileostomy supplies and Dr. Havery Moros is happy to approve.  Thank you for entrusting me with your care and for choosing [facility], Dr. [place] Cellar

## 2022-01-25 NOTE — Progress Notes (Signed)
HPI :   IBD history: 66 year old white female with IBD ( Crohn's) since 48.  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 She was never able to be put in remission prior to colectomy. She was only exposed to steroids prior to her surgery. Post-operatively, she was on Remicade for 3-4 years but did not help too much. She had been on 6MP remotely but did not work for her. She was on Humira but also did not put her into remission and she developed high antibody titers and it was stopped. Most recently on Entyvio and low dose 6MP. 6MP was stopped due to worsening anemia.  She reports multiple courses of antibiotics with some benefit historically.  She has been on budesonider with ? Relief. Father and daughter have UC.  She has seen Jeanne Ivan and Dr. 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 Most recently saw Dr. Renelda Mom at [facility] and had diverting ileostomy April 23 2021. Returned for J pouch excision Jan 2023.   SINCE THE LAST VISIT   66 year old female here for follow-up visit.  She has been doing well since have last seen her.  Recall she had pouch excision at [facility] with Dr. Lia Foyer burn this past January.  Her wounds have healed, she is doing much better.  She has been having stable output from her ileostomy.  This can be loose at times, she eats bananas or applesauce to help firm up her stools.  She denies any blood in her stools.  She does have some "achiness" and soreness to her abdomen at times which can come and go.  She has no clear triggers for this.  She is due to see Dr. Drue Flirt for next month for postoperative follow-up.  She is eating and drinking well without complaints, denies any problems with her bowels at this time.  We discussed options for surveillance.  Her main concern subjectively is her worsening chronic kidney disease for  which she is followed by Dr. Marval Regal.  She has had issues with fluctuations in her blood pressure, she sometimes feels lightheaded with standing quickly, states she has been on standing dose of metoprolol for some time.    Prior workup: Pouchoscopy 03/31/17 - anal fissure, mild anal canal stenosis, focal ulceration of pouch and more proximal ileum but improved compared to previous   Pouchoscopy 01/21/20 -  Preparation of the colon was poor. - A suspected small perianal fistulous tract and anal canal stenosis found on digital rectal exam. - The ileal pouch has an angulated turn with very focal ulcerations, otherwise no inflammatory changes noted. biopsies obtained - Small area of nodularity along anal canal - biopsied to rule out AIN / condyloma - Suspected fistal of the distal pouch just proximal to anal canal Overall, only one focal ulceration noted and no other inflammatory changes seen. Incidentally noted suspected new fistula   EGD 01/21/20 -  - A 3 cm hiatal hernia was present. - LA Grade A esophagitis was found 33 cm from the incisors - mild. - The exam of the esophagus was otherwise normal. No Barrett's esophagus. - Multiple small sessile polyps were found in the gastric fundus and in the gastric body, benign appearing, likely fundic gland polyps, a cluster was noted at the distal end of the hernia sac. Biopsies were taken with a cold forceps for histology. -  The exam of the stomach was otherwise normal. - The duodenal bulb and second portion of the duodenum were normal.   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 Cellulitis extends superiorly throughout the posterior subcutaneous fat, adjacent to the gluteal and posterior paraspinal muscles. No evidence of abscess.     MRE 06/25/20 - IMPRESSION: 1. Persistent  inflammatory changes near the upper gluteal cleft bilaterally between the gluteus maximus muscles. No discrete drainable soft tissue abscess or intramuscular involvement. 2. No MR findings for acute inflammatory small-bowel disease. However, study is limited without contrast.   CT scan abdomen/ pelvis without contrast 05/01/21: IMPRESSION: 1. Findings worrisome for an area of focal bowel perforation versus anastomosis leak within the anterior aspect of the mid abdomen, with extravasation of air and contrast into the adjacent surgical incision site. 2. Postoperative changes within subsequent right lower quadrant ileostomy. 3. Aortic atherosclerosis.   CT scan abdomen / pelvis without contrast 05/14/21: IMPRESSION: Status post proctocolectomy. Ileostomy is noted in right lower quadrant. There is no evidence of bowel obstruction seen at this time.   No definite acute abnormality is seen in the abdomen or pelvis at this time.     CT abdomen / pelvis without contrast 09/22/21: IMPRESSION: 1. Patient is status post colectomy with right lower quadrant ileostomy. Small bowel loops in the upper abdomen and stomach appeared dilated concerning for small bowel obstruction. Transition point is not definitive, but may be at the level of the ostomy. No free air or pneumatosis. 2. There is wall thickening of the distal esophagus and proximal stomach worrisome for gastritis and esophagitis. 3. Small amount of air in the pelvis is unchanged from prior, likely postoperative. 4. Nonobstructing left renal calculus     Past Medical History:  Diagnosis Date   Abdominal pain    Hx   Allergy    Anal stenosis    Anemia    Anxiety    Arthritis    Asthma    patient does not have inhaler   Blood in stool    Hx   Blood in urine    Hx   Blood transfusion without reported diagnosis    Cataract    CKD (chronic kidney disease) stage 3, GFR 30-59 ml/min (HCC)    Crohn's colitis, other  complication (HCC)    De Quervain's tenosynovitis    Depression    Difficulty urinating    Hx   Easy bruising    Esophagitis    Fainting    History - resolved - due to dehydration   Fatigue    Hx   Fibroid    Hx   Gastric polyp    GERD (gastroesophageal reflux disease)    Hearing loss    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   Hemorrhoids, external    Hemorrhoids, internal    Herpes, genital    vaginal treated 07/05/12 and pt states is resolved   History of cervical dysplasia    History of small bowel obstruction    Hyperlipidemia    currently no meds   Hyperparathyroidism    Hypertension    Hypokalemia    Hx   IBD (inflammatory bowel disease)    initially colectomy for suspected UC, 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   Obesity    Ovarian cyst  Poor dental hygiene    Pulmonary nodule, right    right upper lobe   Rectal bleeding    Hx   Rectal pain    Hx   Renal insufficiency    CKD - stage 3   RLS (restless legs syndrome)    no meds   Tooth infection 11/2016   right low   Ulcerative colitis    Visual disturbance    wears glasses   Weakness generalized    Hx - patient denies generalized weakness   Wears dentures    upper only     Past Surgical History:  Procedure Laterality Date   ANAL DILATION     CERVICAL BIOPSY  W/ LOOP ELECTRODE EXCISION     CHOLECYSTECTOMY     COLONOSCOPY     [name]   fatty tumor removed from back     X 2   HEMORRHOID SURGERY     ILEOSTOMY CLOSURE     RESTORATIVE PROCTOCOLECTOMY     with insertion of ileoanal J Pouch with loop ileostomy   SHOULDER ARTHROSCOPY WITH CAPSULORRHAPHY Left 06/14/2019   Procedure: LEFT SHOULDER ARTHRSCOPIC REPAIR OF BONY BANKART FRACTURE;  Surgeon: Tania Ade, MD;  Location: [facility];  Service: Orthopedics;  Laterality: Left;  REQUEST 18 MINUTE   SIGMOIDOSCOPY     TOTAL ABDOMINAL HYSTERECTOMY  1998   TAH/LSO   TUBAL LIGATION     UPPER GASTROINTESTINAL ENDOSCOPY     [name]    Family History  Problem Relation Age of Onset   Ulcerative colitis Father    Hypertension Father    Heart attack Father    Hypertension Mother    Heart disease Mother        s/p pci   Ulcerative colitis Daughter    Irritable bowel syndrome Other        grandchildren   Diabetes Sister    Cancer Sister        uterine   Cancer Maternal Uncle        LUNG   Colon cancer Neg Hx    Esophageal cancer Neg Hx    Stomach cancer Neg Hx    Rectal cancer Neg Hx    Social History   Tobacco Use   Smoking status: Former    Packs/day: 1.00    Years: 4.00    Pack years: 4.00    Types: Cigarettes    Quit date: 05/21/1979    Years since quitting: 42.7   Smokeless tobacco: Never  Vaping Use   Vaping Use: Never used  Substance Use Topics   Alcohol use: No    Alcohol/week: 0.0 standard drinks   Drug use: No   Current Outpatient Medications  Medication Sig Dispense Refill   acetaminophen (TYLENOL) 500 MG tablet Take 500-1,000 mg by mouth at bedtime.     allopurinol (ZYLOPRIM) 300 MG tablet Take 1 tablet (300 mg total) by mouth daily. 90 tablet 1   Calcium Carbonate Antacid (TUMS EXTRA STRENGTH 750 PO) Take 1-2 tablets by mouth See admin instructions. Chew 1-2 tablets by mouth as needed for heartburn or reflux     DULoxetine (CYMBALTA) 60 MG capsule TAKE 2 CAPSULES BY MOUTH EVERY DAY 60 capsule 0   famotidine (PEPCID) 20 MG tablet TAKE 1 TABLET BY MOUTH TWICE A DAY (Patient taking differently: Take 20 mg by mouth daily as needed for heartburn or indigestion.) 242 tablet 1   folic acid (FOLVITE) 1 MG tablet Take 2 tablets (2 mg total) by mouth daily. (  Patient taking differently: Take 1-2 mg by mouth daily.) 180 tablet 3   metoprolol succinate (TOPROL-XL) 100 MG 24 hr tablet Take 50-100 mg by mouth See admin instructions. Take 100 mg by mouth in the morning and 50 mg at bedtime     Vitamin D, Ergocalciferol, (DRISDOL) 1.25 MG (50000 UNIT) CAPS capsule Take 50,000 Units by mouth every 30  (thirty) days.     No current facility-administered medications for this visit.   Allergies  Allergen Reactions   Sulfa Antibiotics Hives, Itching, Rash and Swelling   Morphine Other (See Comments)    "Gives me crazy dreams" (delusions, also)   Sulfonamide Derivatives Hives, Itching and Swelling     Review of Systems: All systems reviewed and negative except where noted in HPI.    Lab Results  Component Value Date   WBC 11.1 (H) 12/08/2021   HGB 10.8 (L) 12/08/2021   HCT 33.9 (L) 12/08/2021   MCV 103.3 (H) 12/08/2021   PLT 368.0 12/08/2021    Lab Results  Component Value Date   CREATININE 3.22 (H) 12/08/2021   BUN 74 (H) 12/08/2021   NA 134 (L) 12/08/2021   K 4.4 12/08/2021   CL 107 12/08/2021   CO2 16 (L) 12/08/2021     Physical Exam: BP 120/80   Pulse 76   Ht 5' 2"  (1.575 m)   Wt 174 lb 9.6 oz (79.2 kg)   BMI 31.93 kg/m  Constitutional: Pleasant,well-developed, female in no acute distress. Neurological: Alert and oriented to person place and time. Psychiatric: Normal mood and affect. Behavior is normal.   ASSESSMENT AND PLAN: 66 year old female here for reassessment following:  Crohn's disease Ileostomy in place  Overall doing much much better since have last seen her.  Recall she was status post colectomy, had J-pouch, either had chronic pouchitis versus Crohn's of the pouch with pouch dysfunction, had longstanding persistent issues that bothered her.  Ultimately had ileostomy with Dr. Drue Flirt and then had excision of the remnant pouch this past January.  She had a tough time recovering from her operations but has healed from them and now she is through the worst of this.  We hope that there is no recurrent small bowel Crohn's moving forward.  We discussed that we should survey her in some capacity to assess for objective inflammation, discussed options and ways to do this.  Offered ileoscopy although she is not interested in that right now unless she really  needs it, would prefer noninvasive testing with stool testing.  After discussion we will go with fecal calprotectin, get baseline at this point and can monitor moving forward.  May consider ileoscopy pending her course.  She will follow-up with nephrology for management of her hypertension and CKD.  Overall very happy with her progress, she is significantly improved from previous and definitely benefited from ileostomy.  Jolly Mango, MD [facility]

## 2022-02-05 LAB — CALPROTECTIN, FECAL: Calprotectin, Fecal: 24 ug/g (ref 0–120)

## 2022-03-04 ENCOUNTER — Telehealth: Payer: Self-pay | Admitting: Family Medicine

## 2022-03-04 NOTE — Telephone Encounter (Signed)
Nurse Assessment Nurse: Velta Addison, RN, Crystal Date/Time (Eastern Time): 03/04/2022 1:10:40 PM Confirm and document reason for call. If symptomatic, describe symptoms. ---Caller states that she would like to see Dr. Etter Sjogren within the next day or two because she is concerned because she has been getting dizzy when she stands up. She is concerned it could be blood or iron. Been getting dizzy when standing for the past few days. States she has had this before and is normally related to her iron, blood levels. No fever Does the patient have any new or worsening symptoms? ---Yes Will a triage be completed? ---Yes Related visit to physician within the last 2 weeks? ---No Does the PT have any chronic conditions? (i.e. diabetes, asthma, this includes High risk factors for pregnancy, etc.) ---Yes List chronic conditions. ---Anemia Is this a behavioral health or substance abuse call? ---No Guidelines Guideline Title Affirmed Question Affirmed Notes Nurse Date/Time (Eastern Time) Dizziness - Lightheadedness Taking a medicine that could cause dizziness (e.g., blood Parrott, RN, Crystal 03/04/2022 1:12:51 PM PLEASE NOTE: All timestamps contained within this report are represented as [place] Standard Time. CONFIDENTIALTY NOTICE: This fax transmission is intended only for the addressee. It contains information that is legally privileged, confidential or otherwise protected from use or disclosure. If you are not the intended recipient, you are strictly prohibited from reviewing, disclosing, copying using or disseminating any of this information or taking any action in reliance on or regarding this information. If you have received this fax in error, please notify us immediately by telephone so that we can arrange for its return to [place]. Phone: [phone], Toll-Free: [phone], Fax: [phone] Page: 2 of 2 Call Id: [id number] Guidelines Guideline Title Affirmed Question Affirmed Notes Nurse  Date/Time Eilene Ghazi Time) pressure medications, diuretics) Disp. Time Eilene Ghazi Time) Disposition Final User 03/04/2022 1:20:02 PM See PCP within 24 Hours Yes Velta Addison, RN, Crystal Final Disposition 03/04/2022 1:20:02 PM See PCP within 24 Hours Yes Parrott, RN, Interior and spatial designer Understands Yes PreDisposition Call Doctor Care Advice Given Per Guideline SEE PCP WITHIN 24 HOURS: * IF OFFICE WILL BE OPEN: You need to be examined within the next 24 hours. Call your doctor (or NP/PA) when the office opens and make an appointment. CARE ADVICE given per Dizziness (Adult) guideline. CALL BACK IF: * You become worse * Passes out (faints) Comments User: Hamilton Capri, RN Date/Time (Eastern Time): 03/04/2022 1:12:25 PM Ulcerative colitis but s/p surgery to have section of colon removed. User: Hamilton Capri, RN Date/Time Eilene Ghazi Time): 03/04/2022 1:15:45 PM May have UTI, states she had UTI about a month ago and it did not clear all the way. Still having incontinence of urine. States she does not feel like she has to go but when walking it leaks out. This was a symptom she was having when on antibiotics. User: Hamilton Capri, RN Date/Time (Eastern Time): 03/04/2022 1:16:45 PM Is having back pain but states she has been having pain on left side due to fall. Did not bump head. User: Hamilton Capri, RN Date/Time Eilene Ghazi Time): 03/04/2022 1:18:23 PM Does take Metoprolol for HTN User: Hamilton Capri, RN Date/Time Eilene Ghazi Time): 03/04/2022 1:21:04 PM For the fall, caller states she got her feet tangled together when going to sit on the picnic table and she fell, did not loose consciousness, states she did not even spill the water she was carrying. Referrals REFERRED TO PCP OFFICE

## 2022-03-04 NOTE — Telephone Encounter (Signed)
Pt stated when she stands up she gets dizzy and would like to schedule an appt. She also has notice some incontinence when going pee. Transferred to triage to speak with a nurse about the dizziness.

## 2022-03-05 ENCOUNTER — Ambulatory Visit (INDEPENDENT_AMBULATORY_CARE_PROVIDER_SITE_OTHER): Payer: Medicare Other | Admitting: Family Medicine

## 2022-03-05 ENCOUNTER — Encounter: Payer: Self-pay | Admitting: Family Medicine

## 2022-03-05 VITALS — BP 128/86 | HR 86 | Temp 97.8°F | Resp 20 | Ht 62.0 in | Wt 178.2 lb

## 2022-03-05 DIAGNOSIS — R8290 Unspecified abnormal findings in urine: Secondary | ICD-10-CM

## 2022-03-05 DIAGNOSIS — D509 Iron deficiency anemia, unspecified: Secondary | ICD-10-CM | POA: Diagnosis not present

## 2022-03-05 DIAGNOSIS — R350 Frequency of micturition: Secondary | ICD-10-CM | POA: Diagnosis not present

## 2022-03-05 LAB — CBC WITH DIFFERENTIAL/PLATELET
Basophils Absolute: 0.1 10*3/uL (ref 0.0–0.1)
Basophils Relative: 0.7 % (ref 0.0–3.0)
Eosinophils Absolute: 0.5 10*3/uL (ref 0.0–0.7)
Eosinophils Relative: 4.5 % (ref 0.0–5.0)
HCT: 33.2 % — ABNORMAL LOW (ref 36.0–46.0)
Hemoglobin: 10.7 g/dL — ABNORMAL LOW (ref 12.0–15.0)
Lymphocytes Relative: 18.1 % (ref 12.0–46.0)
Lymphs Abs: 2.2 10*3/uL (ref 0.7–4.0)
MCHC: 32.2 g/dL (ref 30.0–36.0)
MCV: 106.1 fl — ABNORMAL HIGH (ref 78.0–100.0)
Monocytes Absolute: 0.8 10*3/uL (ref 0.1–1.0)
Monocytes Relative: 6.9 % (ref 3.0–12.0)
Neutro Abs: 8.4 10*3/uL — ABNORMAL HIGH (ref 1.4–7.7)
Neutrophils Relative %: 69.8 % (ref 43.0–77.0)
Platelets: 379 10*3/uL (ref 150.0–400.0)
RBC: 3.13 Mil/uL — ABNORMAL LOW (ref 3.87–5.11)
RDW: 17 % — ABNORMAL HIGH (ref 11.5–15.5)
WBC: 12 10*3/uL — ABNORMAL HIGH (ref 4.0–10.5)

## 2022-03-05 LAB — COMPREHENSIVE METABOLIC PANEL
ALT: 24 U/L (ref 0–35)
AST: 21 U/L (ref 0–37)
Albumin: 4.2 g/dL (ref 3.5–5.2)
Alkaline Phosphatase: 100 U/L (ref 39–117)
BUN: 69 mg/dL — ABNORMAL HIGH (ref 6–23)
CO2: 19 mEq/L (ref 19–32)
Calcium: 9.7 mg/dL (ref 8.4–10.5)
Chloride: 107 mEq/L (ref 96–112)
Creatinine, Ser: 2.93 mg/dL — ABNORMAL HIGH (ref 0.40–1.20)
GFR: 15.97 mL/min — ABNORMAL LOW (ref >60.00)
Glucose, Bld: 103 mg/dL — ABNORMAL HIGH (ref 70–99)
Potassium: 4.6 mEq/L (ref 3.5–5.1)
Sodium: 137 mEq/L (ref 135–145)
Total Bilirubin: 0.3 mg/dL (ref 0.2–1.2)
Total Protein: 7.5 g/dL (ref 6.0–8.3)

## 2022-03-05 LAB — POC URINALSYSI DIPSTICK (AUTOMATED)
Bilirubin, UA: NEGATIVE
Blood, UA: NEGATIVE
Glucose, UA: NEGATIVE
Ketones, UA: NEGATIVE
Nitrite, UA: NEGATIVE
Protein, UA: POSITIVE — AB
Spec Grav, UA: 1.02 (ref 1.010–1.025)
Urobilinogen, UA: 0.2 E.U./dL
pH, UA: 5 (ref 5.0–8.0)

## 2022-03-05 LAB — IBC + FERRITIN
Ferritin: 172.9 ng/mL (ref 10.0–291.0)
Iron: 95 ug/dL (ref 42–145)
Saturation Ratios: 21.1 % (ref 20.0–50.0)
TIBC: 450.8 ug/dL — ABNORMAL HIGH (ref 250.0–450.0)
Transferrin: 322 mg/dL (ref 212.0–360.0)

## 2022-03-05 MED ORDER — CIPROFLOXACIN HCL 500 MG PO TABS: 500.0000 mg | ORAL_TABLET | Freq: Two times a day (BID) | ORAL | 0 refills | Status: AC

## 2022-03-05 NOTE — Progress Notes (Signed)
Established Patient Office Visit  Subjective   Patient ID: [name], female    DOB: 09-15-1955  Age: 66 y.o. MRN: [id number]  Chief Complaint  Patient presents with   Urinary Frequency    Pt states having freq and having accidents.     HPI Pt is here c/o urinary frequency and having accidents.   The nephrologist gave her 5 days of antibiotic about 2 weeks ago.   It helped a little bit and then started back      this has been going on for several months-- off and on   Patient Active Problem List   Diagnosis Date Noted   Urinary frequency 03/05/2022   Neck pain 12/08/2021   Facial numbness 12/08/2021   Tooth abscess 11/10/2021   Surgical wound breakdown, initial encounter 05/27/2021   Crohn's disease of both small and large intestine ([place]) 05/20/2021   Acute kidney injury superimposed on chronic kidney disease ([place]) 05/14/2021   Thrombocytosis 05/14/2021   SIRS (systemic inflammatory response syndrome) ([place]) 05/14/2021   Hypochloremia 05/14/2021   Chronic pain of left knee 03/10/2021   Depression, major, single episode, severe ([place]) 12/18/2020   Hyperlipidemia associated with type 2 diabetes mellitus ([place]) 12/18/2020   Severe sepsis ([place]) 12/07/2020   Acute pyelonephritis 12/07/2020   Closed fracture of left tibial plateau 10/30/2020   Screening for osteoporosis 10/30/2020   Diarrhea with dehydration 09/21/2020   Crohn's disease of jejunum ([place]) 08/23/2020   Chronic diarrhea 19/50/9326   Metabolic acidosis 71/24/5809   ARF (acute renal failure) ([place]) 08/23/2020   Acute renal failure superimposed on stage 4 chronic kidney disease ([place]) 08/22/2020   Hyperglycemia 02/26/2020   Immunologic deficiency syndrome ([place]) 02/26/2020   Prothrombin gene mutation ([place]) 02/26/2020   Acute pain of left shoulder 06/04/2019   Pes anserine bursitis 04/12/2018   Pain of right thumb 04/12/2018   Low back pain 06/21/2017   Acute right-sided low back pain with right-sided sciatica  06/21/2017   Gout 06/21/2017   Superficial thrombophlebitis of right upper extremity 98/33/8250   Acute basilic vein thrombosis, right 02/16/2017   Symptomatic anemia 01/15/2017   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[place]) 12/01/2015   Rectal pain 12/01/2015   IBD (inflammatory bowel disease) 10/24/2015   Anal stricture 10/24/2015   Gout of foot 10/29/2014   Ovarian cyst, right 06/12/2014   Acute on chronic renal failure ([place]) 04/02/2013   Hydrosalpinx 02/14/2013   Hyperparathyroidism ([place]) 09/18/2012   HSV (herpes simplex virus) anogenital infection 06/28/2012   Stage 4 chronic kidney disease ([place]) 06/17/2012   LLQ abdominal pain 06/22/2011   RESTLESS LEGS SYNDROME 08/05/2009   PULMONARY NODULE, RIGHT UPPER LOBE 02/28/2009   TOBACCO ABUSE, HX OF 02/11/2009   PAIN IN JOINT, MULTIPLE SITES 02/04/2009   Nonspecific (abnormal) findings on radiological and other examination of body structure 12/23/2008   CT, CHEST, ABNORMAL 12/23/2008   HEMANGIOMA SIMPLEX 12/16/2008   Pain in joint, pelvic region and thigh 12/16/2008   SKIN RASH 12/16/2008   TINEA CORPORIS 04/18/2008   LATERAL EPICONDYLITIS, RIGHT 04/18/2008   Morbid obesity ([place]) 02/05/2008   ANEMIA 02/05/2008   ANAL STENOSIS 02/05/2008   Stool culture positive for Clostridioides difficile 02/05/2008   Personal history of other endocrine, metabolic, and immunity disorders 02/05/2008   Personal history of other diseases of digestive system 02/05/2008   SYNCOPE AND COLLAPSE 02/02/2008   Hyponatremia 12/15/2007   Dehydration 12/15/2007   HYPOKALEMIA 12/15/2007   ANEMIA-IRON DEFICIENCY 12/15/2007   LEUKOCYTOSIS 12/15/2007   Anxiety  state 12/15/2007   Disorder resulting from impaired renal function 11/16/2007   TINNITUS NOS 05/25/2007   DE QUERVAIN'S TENOSYNOVITIS 05/25/2007   Hyperlipidemia LDL goal <100 10/23/2006   Depression 10/23/2006   Essential hypertension 10/23/2006   GERD 10/23/2006   Gastro-esophageal reflux disease without esophagitis  10/23/2006   Ulcerative colitis without complications ([place]) 29/93/7169   GASTRIC POLYP 11/16/2001   GERD with esophagitis 11/16/2001   Past Medical History:  Diagnosis Date   Abdominal pain    Hx   Allergy    Anal stenosis    Anemia    Anxiety    Arthritis    Asthma    patient does not have inhaler   Blood in stool    Hx   Blood in urine    Hx   Blood transfusion without reported diagnosis    Cataract    CKD (chronic kidney disease) stage 3, GFR 30-59 ml/min (HCC)    Crohn's colitis, other complication (HCC)    De Quervain's tenosynovitis    Depression    Difficulty urinating    Hx   Easy bruising    Esophagitis    Fainting    History - resolved - due to dehydration   Fatigue    Hx   Fibroid    Hx   Gastric polyp    GERD (gastroesophageal reflux disease)    Hearing loss    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   Hemorrhoids, external    Hemorrhoids, internal    Herpes, genital    vaginal treated 07/05/12 and pt states is resolved   History of cervical dysplasia    History of small bowel obstruction    Hyperlipidemia    currently no meds   Hyperparathyroidism    Hypertension    Hypokalemia    Hx   IBD (inflammatory bowel disease)    initially colectomy for suspected UC, 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   Obesity    Ovarian cyst    Poor dental hygiene    Pulmonary nodule, right    right upper lobe   Rectal bleeding    Hx   Rectal pain    Hx   Renal insufficiency    CKD - stage 3   RLS (restless legs syndrome)    no meds   Tooth infection 11/2016   right low   Ulcerative colitis    Visual disturbance    wears glasses   Weakness generalized    Hx - patient denies generalized weakness   Wears dentures    upper only   Past Surgical History:  Procedure Laterality Date   ANAL DILATION     CERVICAL BIOPSY  W/ LOOP ELECTRODE EXCISION     CHOLECYSTECTOMY     COLONOSCOPY     [name]   fatty tumor removed from back     X 2    HEMORRHOID SURGERY     ILEOSTOMY CLOSURE     RESTORATIVE PROCTOCOLECTOMY     with insertion of ileoanal J Pouch with loop ileostomy   SHOULDER ARTHROSCOPY WITH CAPSULORRHAPHY Left 06/14/2019   Procedure: LEFT SHOULDER ARTHRSCOPIC REPAIR OF BONY BANKART FRACTURE;  Surgeon: Tania Ade, MD;  Location: [facility];  Service: Orthopedics;  Laterality: Left;  REQUEST 90 MINUTE   SIGMOIDOSCOPY     TOTAL ABDOMINAL HYSTERECTOMY  1998   TAH/LSO   TUBAL LIGATION     UPPER GASTROINTESTINAL ENDOSCOPY     Olevia Perches  Social History   Tobacco Use   Smoking status: Former    Packs/day: 1.00    Years: 4.00    Total pack years: 4.00    Types: Cigarettes    Quit date: 05/21/1979    Years since quitting: 42.8   Smokeless tobacco: Never  Vaping Use   Vaping Use: Never used  Substance Use Topics   Alcohol use: No    Alcohol/week: 0.0 standard drinks of alcohol   Drug use: No   Social History   Socioeconomic History   Marital status: Divorced    Spouse name: Not on file   Number of children: Not on file   Years of education: Not on file   Highest education level: Not on file  Occupational History   Not on file  Tobacco Use   Smoking status: Former    Packs/day: 1.00    Years: 4.00    Total pack years: 4.00    Types: Cigarettes    Quit date: 05/21/1979    Years since quitting: 42.8   Smokeless tobacco: Never  Vaping Use   Vaping Use: Never used  Substance and Sexual Activity   Alcohol use: No    Alcohol/week: 0.0 standard drinks of alcohol   Drug use: No   Sexual activity: Yes    Birth control/protection: Post-menopausal    Comment: 1st intercourse 66 yo-Fewer than 5 partners  Other Topics Concern   Not on file  Social History Narrative   Not on file   Social Determinants of Health   Financial Resource Strain: Not on file  Food Insecurity: Not on file  Transportation Needs: Not on file  Physical Activity: Not on file  Stress: Not on file  Social Connections: Not on file   Intimate Partner Violence: Not on file   Family Status  Relation Name Status   Father  Deceased   Mother  Alive   Daughter  (Not Specified)   Other  (Not Specified)   Sister  (Not Specified)   Administrator  (Not Specified)   Neg Hx  (Not Specified)   Family History  Problem Relation Age of Onset   Ulcerative colitis Father    Hypertension Father    Heart attack Father    Hypertension Mother    Heart disease Mother        s/p pci   Ulcerative colitis Daughter    Irritable bowel syndrome Other        grandchildren   Diabetes Sister    Cancer Sister        uterine   Cancer Maternal Uncle        LUNG   Colon cancer Neg Hx    Esophageal cancer Neg Hx    Stomach cancer Neg Hx    Rectal cancer Neg Hx    Allergies  Allergen Reactions   Sulfa Antibiotics Hives, Itching, Rash and Swelling   Morphine Other (See Comments)    "Gives me crazy dreams" (delusions, also)   Sulfonamide Derivatives Hives, Itching and Swelling     .   Review of Systems  Constitutional:  Negative for fever and malaise/fatigue.  HENT:  Negative for congestion.   Eyes:  Negative for blurred vision.  Respiratory:  Negative for shortness of breath.   Cardiovascular:  Negative for chest pain, palpitations and leg swelling.  Gastrointestinal:  Negative for abdominal pain, blood in stool and nausea.  Genitourinary:  Negative for dysuria and frequency.  Musculoskeletal:  Negative for falls.  Skin:  Negative for rash.  Neurological:  Negative for dizziness, loss of consciousness and headaches.  Endo/Heme/Allergies:  Negative for environmental allergies.  Psychiatric/Behavioral:  Negative for depression. The patient is not nervous/anxious.       Objective:     BP 128/86 (BP Location: Right Arm, Patient Position: Sitting, Cuff Size: Large)   Pulse 86   Temp 97.8 F (36.6 C) (Oral)   Resp 20   Ht 5' 2"  (1.575 m)   Wt 178 lb 3.2 oz (80.8 kg)   SpO2 98%   BMI 32.59 kg/m  BP Readings from Last 3  Encounters:  03/05/22 128/86  01/25/22 120/80  12/08/21 118/86   Wt Readings from Last 3 Encounters:  03/05/22 178 lb 3.2 oz (80.8 kg)  01/25/22 174 lb 9.6 oz (79.2 kg)  12/08/21 173 lb 9.6 oz (78.7 kg)   SpO2 Readings from Last 3 Encounters:  03/05/22 98%  12/08/21 97%  11/10/21 99%      Physical Exam Vitals and nursing note reviewed.  Constitutional:      Appearance: She is well-developed.  HENT:     Head: Normocephalic and atraumatic.  Eyes:     Conjunctiva/sclera: Conjunctivae normal.  Neck:     Thyroid: No thyromegaly.     Vascular: No carotid bruit or JVD.  Cardiovascular:     Rate and Rhythm: Normal rate and regular rhythm.     Heart sounds: Normal heart sounds. No murmur heard. Pulmonary:     Effort: Pulmonary effort is normal. No respiratory distress.     Breath sounds: Normal breath sounds. No wheezing or rales.  Chest:     Chest wall: No tenderness.  Musculoskeletal:     Cervical back: Normal range of motion and neck supple.  Neurological:     Mental Status: She is alert and oriented to person, place, and time.      Results for orders placed or performed in visit on 03/05/22  POCT Urinalysis Dipstick (Automated)  Result Value Ref Range   Color, UA Light Yellow    Clarity, UA cloudy    Glucose, UA Negative Negative   Bilirubin, UA negative    Ketones, UA negative    Spec Grav, UA 1.020 1.010 - 1.025   Blood, UA negative    pH, UA 5.0 5.0 - 8.0   Protein, UA Positive (A) Negative   Urobilinogen, UA 0.2 0.2 or 1.0 E.U./dL   Nitrite, UA negative    Leukocytes, UA Small (1+) (A) Negative    Last CBC Lab Results  Component Value Date   WBC 11.1 (H) 12/08/2021   HGB 10.8 (L) 12/08/2021   HCT 33.9 (L) 12/08/2021   MCV 103.3 (H) 12/08/2021   MCH 33.3 09/22/2021   RDW 19.6 (H) 12/08/2021   PLT 368.0 74/25/9563   Last metabolic panel Lab Results  Component Value Date   GLUCOSE 81 12/08/2021   NA 134 (L) 12/08/2021   K 4.4 12/08/2021   CL  107 12/08/2021   CO2 16 (L) 12/08/2021   BUN 74 (H) 12/08/2021   CREATININE 3.22 (H) 12/08/2021   GFRNONAA 20 (L) 09/22/2021   CALCIUM 9.8 12/08/2021   PHOS 4.3 12/18/2020   PROT 7.8 12/08/2021   ALBUMIN 4.3 12/08/2021   BILITOT 0.3 12/08/2021   ALKPHOS 92 12/08/2021   AST 22 12/08/2021   ALT 23 12/08/2021   ANIONGAP 13 09/22/2021   Last lipids Lab Results  Component Value Date   CHOL 252 (H) 12/08/2021   HDL 48.80  12/08/2021   LDLCALC 116 (H) 02/26/2020   LDLDIRECT 130.0 12/08/2021   TRIG 383.0 (H) 12/08/2021   CHOLHDL 5 12/08/2021   Last hemoglobin A1c Lab Results  Component Value Date   HGBA1C 5.6 08/23/2020   Last thyroid functions Lab Results  Component Value Date   TSH 3.75 12/08/2021   Last vitamin D Lab Results  Component Value Date   VD25OH 32.51 10/08/2020   Last vitamin B12 and Folate Lab Results  Component Value Date   [id number] 12/08/2021   FOLATE 26.5 05/15/2021      The 10-year ASCVD risk score (Arnett DK, et al., 2019) is: 21.1%    Assessment & Plan:   Problem List Items Addressed This Visit       Unprioritized   ANEMIA-IRON DEFICIENCY   Relevant Orders   Ambulatory referral to Hematology / Oncology   CBC with Differential/Platelet   IBC + Ferritin   Urinary frequency - Primary    Culture pending cipro x 5 days  Call or rto if symptoms persist       Relevant Medications   ciprofloxacin (CIPRO) 500 MG tablet   Other Relevant Orders   POCT Urinalysis Dipstick (Automated) (Completed)   Comprehensive metabolic panel   Other Visit Diagnoses     Abnormal urine findings       Relevant Medications   ciprofloxacin (CIPRO) 500 MG tablet   Other Relevant Orders   Urine Culture   Comprehensive metabolic panel       Return in about 6 months (around 09/04/2022), or if symptoms worsen or fail to improve, for annual exam, fasting.    Ann Held, DO

## 2022-03-05 NOTE — Patient Instructions (Signed)
Urinary Frequency, Adult Urinary frequency means urinating more often than usual. You may urinate every 1-2 hours even though you drink a normal amount of fluid and do not have a bladder infection or condition. Although you urinate more often than normal, the total amount of urine produced in a day is normal. With urinary frequency, you may have an urgent need to urinate often. The stress and anxiety of needing to find a bathroom quickly can make this urge worse. This condition may go away on its own, or you may need treatment at home. Home treatment may include bladder training, exercises, taking medicines, or making changes to your diet. Follow these instructions at home: Bladder health Your health care provider will tell you what to do to improve bladder health. You may be told to: Keep a bladder diary. Keep track of: What you eat and drink. How often you urinate. How much you urinate. Follow a bladder training program. This may include: Learning to delay going to the bathroom. Double urinating, also called voiding. This helps if you are not completely emptying your bladder. Scheduled voiding. Do Kegel exercises. Kegel exercises strengthen the muscles that help control urination, which may help the condition.  Eating and drinking Follow instructions from your health care provider about eating or drinking restrictions. You may be told to: Avoid caffeine. Drink fewer fluids, especially alcohol. Avoid drinking in the evening. Avoid foods or drinks that may irritate the bladder. These include coffee, tea, soda, artificial sweeteners, citrus, tomato-based foods, and chocolate. Eat foods that help prevent or treat constipation. Constipation can make urinary frequency worse. You may need to take these actions to prevent or treat constipation: Drink enough fluid to keep your urine pale yellow. Take over-the-counter or prescription medicines. Eat foods that are high in fiber, such as beans, whole  grains, and fresh fruits and vegetables. Limit foods that are high in fat and processed sugars, such as fried or sweet foods. General instructions Take over-the-counter and prescription medicines only as told by your health care provider. Keep all follow-up visits. This is important. Contact a health care provider if: You start urinating more often. You feel pain or irritation when you urinate. You notice blood in your urine. Your urine looks cloudy. You develop a fever. You begin vomiting. Get help right away if: You are unable to urinate. Summary Urinary frequency means urinating more often than usual. With urinary frequency, you may urinate every 1-2 hours even though you drink a normal amount of fluid and do not have a bladder infection or other bladder condition. Your health care provider may recommend that you keep a bladder diary, follow a bladder training program, or make dietary changes. If told by your health care provider, do Kegel exercises to strengthen the muscles that help control urination. Take over-the-counter and prescription medicines only as told by your health care provider. Contact a health care provider if your symptoms do not improve or get worse. This information is not intended to replace advice given to you by your health care provider. Make sure you discuss any questions you have with your health care provider. Document Revised: 03/28/2020 Document Reviewed: 03/28/2020 Elsevier Patient Education  [place].

## 2022-03-05 NOTE — Assessment & Plan Note (Signed)
Culture pending cipro x 5 days  Call or rto if symptoms persist

## 2022-03-05 NOTE — Telephone Encounter (Signed)
Pt had visit today.

## 2022-03-06 LAB — URINE CULTURE
MICRO NUMBER:: 13594170
SPECIMEN QUALITY:: ADEQUATE

## 2022-03-08 ENCOUNTER — Other Ambulatory Visit: Payer: Self-pay | Admitting: Family Medicine

## 2022-03-08 ENCOUNTER — Other Ambulatory Visit: Payer: Self-pay | Admitting: Family

## 2022-03-08 DIAGNOSIS — D649 Anemia, unspecified: Secondary | ICD-10-CM

## 2022-03-08 DIAGNOSIS — D72829 Elevated white blood cell count, unspecified: Secondary | ICD-10-CM

## 2022-03-10 ENCOUNTER — Inpatient Hospital Stay: Payer: Medicare Other | Attending: Hematology & Oncology

## 2022-03-10 ENCOUNTER — Telehealth: Payer: Self-pay

## 2022-03-10 ENCOUNTER — Inpatient Hospital Stay (HOSPITAL_BASED_OUTPATIENT_CLINIC_OR_DEPARTMENT_OTHER): Payer: Medicare Other | Admitting: Family

## 2022-03-10 ENCOUNTER — Telehealth: Payer: Self-pay | Admitting: *Deleted

## 2022-03-10 ENCOUNTER — Encounter: Payer: Self-pay | Admitting: Family

## 2022-03-10 VITALS — BP 104/67 | HR 95 | Temp 97.8°F | Resp 17 | Wt 178.1 lb

## 2022-03-10 DIAGNOSIS — D500 Iron deficiency anemia secondary to blood loss (chronic): Secondary | ICD-10-CM | POA: Insufficient documentation

## 2022-03-10 DIAGNOSIS — K51918 Ulcerative colitis, unspecified with other complication: Secondary | ICD-10-CM

## 2022-03-10 DIAGNOSIS — D6862 Lupus anticoagulant syndrome: Secondary | ICD-10-CM | POA: Diagnosis not present

## 2022-03-10 DIAGNOSIS — D649 Anemia, unspecified: Secondary | ICD-10-CM

## 2022-03-10 LAB — CBC WITH DIFFERENTIAL (CANCER CENTER ONLY)
Abs Immature Granulocytes: 0.07 10*3/uL (ref 0.00–0.07)
Basophils Absolute: 0.1 10*3/uL (ref 0.0–0.1)
Basophils Relative: 1 %
Eosinophils Absolute: 0.6 10*3/uL — ABNORMAL HIGH (ref 0.0–0.5)
Eosinophils Relative: 5 %
HCT: 32.9 % — ABNORMAL LOW (ref 36.0–46.0)
Hemoglobin: 10.5 g/dL — ABNORMAL LOW (ref 12.0–15.0)
Immature Granulocytes: 1 %
Lymphocytes Relative: 19 %
Lymphs Abs: 2.1 10*3/uL (ref 0.7–4.0)
MCH: 33.7 pg (ref 26.0–34.0)
MCHC: 31.9 g/dL (ref 30.0–36.0)
MCV: 105.4 fL — ABNORMAL HIGH (ref 80.0–100.0)
Monocytes Absolute: 0.6 10*3/uL (ref 0.1–1.0)
Monocytes Relative: 6 %
Neutro Abs: 7.4 10*3/uL (ref 1.7–7.7)
Neutrophils Relative %: 68 %
Platelet Count: 328 10*3/uL (ref 150–400)
RBC: 3.12 MIL/uL — ABNORMAL LOW (ref 3.87–5.11)
RDW: 16.2 % — ABNORMAL HIGH (ref 11.5–15.5)
WBC Count: 10.8 10*3/uL — ABNORMAL HIGH (ref 4.0–10.5)
nRBC: 0.2 % (ref 0.0–0.2)

## 2022-03-10 LAB — RETICULOCYTES
Immature Retic Fract: 20.6 % — ABNORMAL HIGH (ref 2.3–15.9)
RBC.: 3.08 MIL/uL — ABNORMAL LOW (ref 3.87–5.11)
Retic Count, Absolute: 125.4 10*3/uL (ref 19.0–186.0)
Retic Ct Pct: 4.1 % — ABNORMAL HIGH (ref 0.4–3.1)

## 2022-03-10 LAB — CMP (CANCER CENTER ONLY)
ALT: 25 U/L (ref 0–44)
AST: 23 U/L (ref 15–41)
Albumin: 4.5 g/dL (ref 3.5–5.0)
Alkaline Phosphatase: 91 U/L (ref 38–126)
Anion gap: 12 (ref 5–15)
BUN: 54 mg/dL — ABNORMAL HIGH (ref 8–23)
CO2: 17 mmol/L — ABNORMAL LOW (ref 22–32)
Calcium: 9.7 mg/dL (ref 8.9–10.3)
Chloride: 107 mmol/L (ref 98–111)
Creatinine: 3.13 mg/dL (ref 0.44–1.00)
GFR, Estimated: 16 mL/min — ABNORMAL LOW (ref >60)
Glucose, Bld: 104 mg/dL — ABNORMAL HIGH (ref 70–99)
Potassium: 3.9 mmol/L (ref 3.5–5.1)
Sodium: 136 mmol/L (ref 135–145)
Total Bilirubin: 0.3 mg/dL (ref 0.3–1.2)
Total Protein: 8 g/dL (ref 6.5–8.1)

## 2022-03-10 LAB — SAVE SMEAR(SSMR), FOR PROVIDER SLIDE REVIEW

## 2022-03-10 LAB — FERRITIN: Ferritin: 230 ng/mL (ref 11–307)

## 2022-03-10 LAB — FOLATE: Folate: >40 ng/mL (ref >5.9)

## 2022-03-10 LAB — LACTATE DEHYDROGENASE: LDH: 144 U/L (ref 98–192)

## 2022-03-10 LAB — VITAMIN B12: Vitamin B-12: 650 pg/mL (ref 180–914)

## 2022-03-10 NOTE — Progress Notes (Signed)
Hematology and Oncology Follow Up Visit  [name] [id number] [birth year] 66 y.o. 03/10/2022   Principle Diagnosis:  Recurrent iron deficiency anemia secondary to ulcerative colitis Right cephalic/basilic vein thrombus - transiently positive lupus anticoagulant   Current Therapy:        IV iron as indicated    Interim History:  [name] is here today to re-establish care for iron deficiency anemia. We last saw her in April 2021.  She had her J pouch removed due to persistent injection and now has an ileostomy. She states that this has been so much better for her.  She has not noted any obvious blood loss. No abnormal bruising, no petechiae.  She is symptomatic with fatigue, dizziness and numbness and tingling in her feet.  She did trip and fall a month ago and states that thankfully she was not seriously injured beyond being a little sore in her left side. This tenderness has since resolved.  She completed her Cipro for UTI today.  No syncope.  No swelling or tenderness in her lower extremities.  She has maintained a good appetite and will getting her lower dentured in August. She admits that she does need to hydrate more throughout the day. Her weight is stable at 178 lbs.   ECOG Performance Status: 1 - Symptomatic but completely ambulatory  Medications:  Allergies as of 03/10/2022       Reactions   Sulfa Antibiotics Hives, Itching, Rash, Swelling   Morphine Other (See Comments)   "Gives me crazy dreams" (delusions, also)   Sulfonamide Derivatives Hives, Itching, Swelling        Medication List        Accurate as of March 10, 2022 10:50 AM. If you have any questions, ask your nurse or doctor.          acetaminophen 500 MG tablet Commonly known as: TYLENOL Take 500-1,000 mg by mouth at bedtime.   allopurinol 300 MG tablet Commonly known as: ZYLOPRIM Take 1 tablet (300 mg total) by mouth daily.   ciprofloxacin 500 MG tablet Commonly known as: Cipro Take 1  tablet (500 mg total) by mouth 2 (two) times daily for 10 days.   DULoxetine 60 MG capsule Commonly known as: CYMBALTA TAKE 2 CAPSULES BY MOUTH EVERY DAY   famotidine 20 MG tablet Commonly known as: PEPCID TAKE 1 TABLET BY MOUTH TWICE A DAY What changed:  when to take this reasons to take this   folic acid 1 MG tablet Commonly known as: FOLVITE Take 2 tablets (2 mg total) by mouth daily. What changed: how much to take   metoprolol succinate 100 MG 24 hr tablet Commonly known as: TOPROL-XL Take 50-100 mg by mouth See admin instructions. Take 100 mg by mouth in the morning and 50 mg at bedtime   TUMS EXTRA STRENGTH 750 PO Take 1-2 tablets by mouth See admin instructions. Chew 1-2 tablets by mouth as needed for heartburn or reflux   Vitamin D (Ergocalciferol) 1.25 MG (50000 UNIT) Caps capsule Commonly known as: DRISDOL Take 50,000 Units by mouth every 30 (thirty) days.        Allergies:  Allergies  Allergen Reactions   Sulfa Antibiotics Hives, Itching, Rash and Swelling   Morphine Other (See Comments)    "Gives me crazy dreams" (delusions, also)   Sulfonamide Derivatives Hives, Itching and Swelling    Past Medical History, Surgical history, Social history, and Family History were reviewed and updated.  Review of Systems: All other  10 point review of systems is negative.   Physical Exam:  vitals were not taken for this visit.   Wt Readings from Last 3 Encounters:  03/05/22 178 lb 3.2 oz (80.8 kg)  01/25/22 174 lb 9.6 oz (79.2 kg)  12/08/21 173 lb 9.6 oz (78.7 kg)    Ocular: Sclerae unicteric, pupils equal, round and reactive to light Ear-nose-throat: Oropharynx clear, dentition fair Lymphatic: No cervical or supraclavicular adenopathy Lungs no rales or rhonchi, good excursion bilaterally Heart regular rate and rhythm, no murmur appreciated Abd soft, nontender, positive bowel sounds MSK no focal spinal tenderness, no joint edema Neuro: non-focal,  well-oriented, appropriate affect Breasts: Deferred   Lab Results  Component Value Date   WBC 12.0 (H) 03/05/2022   HGB 10.7 (L) 03/05/2022   HCT 33.2 (L) 03/05/2022   MCV 106.1 (H) 03/05/2022   PLT 379.0 03/05/2022   Lab Results  Component Value Date   FERRITIN 172.9 03/05/2022   IRON 95 03/05/2022   TIBC 450.8 (H) 03/05/2022   UIBC 314 05/15/2021   IRONPCTSAT 21.1 03/05/2022   Lab Results  Component Value Date   RETICCTPCT 1.4 05/15/2021   RBC 3.13 (L) 03/05/2022   No results found for: "KPAFRELGTCHN", "LAMBDASER", "KAPLAMBRATIO" No results found for: "IGGSERUM", "IGA", "IGMSERUM" No results found for: "TOTALPROTELP", "ALBUMINELP", "A1GS", "A2GS", "BETS", "BETA2SER", "GAMS", "MSPIKE", "SPEI"   Chemistry      Component Value Date/Time   NA 137 03/05/2022 1135   NA 142 09/08/2017 0923   K 4.6 03/05/2022 1135   K 3.7 09/08/2017 0923   CL 107 03/05/2022 1135   CL 97 (L) 09/08/2017 0923   CO2 19 03/05/2022 1135   CO2 28 09/08/2017 0923   BUN 69 (H) 03/05/2022 1135   BUN 49 (H) 09/08/2017 0923   CREATININE 2.93 (H) 03/05/2022 1135   CREATININE 2.38 (H) 08/16/2018 0745   CREATININE 3.1 (HH) 09/08/2017 0923      Component Value Date/Time   CALCIUM 9.7 03/05/2022 1135   CALCIUM 9.9 09/08/2017 0923   CALCIUM 8.9 04/12/2013 0851   ALKPHOS 100 03/05/2022 1135   ALKPHOS 89 (H) 09/08/2017 0923   AST 21 03/05/2022 1135   AST 15 08/16/2018 0745   ALT 24 03/05/2022 1135   ALT 12 08/16/2018 0745   ALT 22 09/08/2017 0923   BILITOT 0.3 03/05/2022 1135   BILITOT 0.3 08/16/2018 0745       Impression and Plan: [name] is a very pleasant 66 yo caucasian female with iron deficiency anemia secondary to intermittent GI blood loss/malabsorption with UC. 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 She is symptomatic as mentioned above. Iron studies are pending. We will get her set up with infusion if needed.  Follow-up in 3 months.   Jeanette Dawson, NP 7/5/202310:50 AM

## 2022-03-10 NOTE — Telephone Encounter (Signed)
Critical creatinine of 3.1 received from lab, NP aware.

## 2022-03-10 NOTE — Telephone Encounter (Signed)
Per 03/10/22 los - gave upcoming appointments - confirmed

## 2022-03-11 LAB — IRON AND IRON BINDING CAPACITY (CC-WL,HP ONLY)
Iron: 91 ug/dL (ref 28–170)
Saturation Ratios: 21 % (ref 10.4–31.8)
TIBC: 438 ug/dL (ref 250–450)
UIBC: 347 ug/dL (ref 148–442)

## 2022-03-12 ENCOUNTER — Other Ambulatory Visit: Payer: Self-pay

## 2022-03-12 DIAGNOSIS — D509 Iron deficiency anemia, unspecified: Secondary | ICD-10-CM

## 2022-03-12 LAB — ERYTHROPOIETIN: Erythropoietin: 9.8 m[IU]/mL (ref 2.6–18.5)

## 2022-03-26 ENCOUNTER — Other Ambulatory Visit: Payer: Self-pay | Admitting: Family

## 2022-04-01 ENCOUNTER — Telehealth: Payer: Self-pay | Admitting: *Deleted

## 2022-04-01 ENCOUNTER — Inpatient Hospital Stay: Payer: Medicare Other

## 2022-04-01 DIAGNOSIS — D6862 Lupus anticoagulant syndrome: Secondary | ICD-10-CM | POA: Diagnosis not present

## 2022-04-01 DIAGNOSIS — D649 Anemia, unspecified: Secondary | ICD-10-CM

## 2022-04-01 DIAGNOSIS — K51918 Ulcerative colitis, unspecified with other complication: Secondary | ICD-10-CM | POA: Diagnosis not present

## 2022-04-01 DIAGNOSIS — D500 Iron deficiency anemia secondary to blood loss (chronic): Secondary | ICD-10-CM | POA: Diagnosis not present

## 2022-04-01 LAB — CMP (CANCER CENTER ONLY)
ALT: 22 U/L (ref 0–44)
AST: 21 U/L (ref 15–41)
Albumin: 4.4 g/dL (ref 3.5–5.0)
Alkaline Phosphatase: 101 U/L (ref 38–126)
Anion gap: 11 (ref 5–15)
BUN: 62 mg/dL — ABNORMAL HIGH (ref 8–23)
CO2: 15 mmol/L — ABNORMAL LOW (ref 22–32)
Calcium: 9.5 mg/dL (ref 8.9–10.3)
Chloride: 108 mmol/L (ref 98–111)
Creatinine: 3.95 mg/dL (ref 0.44–1.00)
GFR, Estimated: 12 mL/min — ABNORMAL LOW (ref >60)
Glucose, Bld: 172 mg/dL — ABNORMAL HIGH (ref 70–99)
Potassium: 4.4 mmol/L (ref 3.5–5.1)
Sodium: 134 mmol/L — ABNORMAL LOW (ref 135–145)
Total Bilirubin: 0.3 mg/dL (ref 0.3–1.2)
Total Protein: 7.9 g/dL (ref 6.5–8.1)

## 2022-04-01 LAB — CBC WITH DIFFERENTIAL (CANCER CENTER ONLY)
Abs Immature Granulocytes: 0.08 10*3/uL — ABNORMAL HIGH (ref 0.00–0.07)
Basophils Absolute: 0 10*3/uL (ref 0.0–0.1)
Basophils Relative: 0 %
Eosinophils Absolute: 1.1 10*3/uL — ABNORMAL HIGH (ref 0.0–0.5)
Eosinophils Relative: 8 %
HCT: 34.7 % — ABNORMAL LOW (ref 36.0–46.0)
Hemoglobin: 11 g/dL — ABNORMAL LOW (ref 12.0–15.0)
Immature Granulocytes: 1 %
Lymphocytes Relative: 18 %
Lymphs Abs: 2.5 10*3/uL (ref 0.7–4.0)
MCH: 34 pg (ref 26.0–34.0)
MCHC: 31.7 g/dL (ref 30.0–36.0)
MCV: 107.1 fL — ABNORMAL HIGH (ref 80.0–100.0)
Monocytes Absolute: 0.8 10*3/uL (ref 0.1–1.0)
Monocytes Relative: 6 %
Neutro Abs: 9 10*3/uL — ABNORMAL HIGH (ref 1.7–7.7)
Neutrophils Relative %: 67 %
Platelet Count: 333 10*3/uL (ref 150–400)
RBC: 3.24 MIL/uL — ABNORMAL LOW (ref 3.87–5.11)
RDW: 15.9 % — ABNORMAL HIGH (ref 11.5–15.5)
WBC Count: 13.5 10*3/uL — ABNORMAL HIGH (ref 4.0–10.5)
nRBC: 0.3 % — ABNORMAL HIGH (ref 0.0–0.2)

## 2022-04-01 LAB — SAMPLE TO BLOOD BANK

## 2022-04-01 LAB — FERRITIN: Ferritin: 206 ng/mL (ref 11–307)

## 2022-04-01 NOTE — Telephone Encounter (Signed)
Message received from patient stating that she is fatigued with dizziness and would like to know if she can come in earlier for lab work.  Call placed back to patient and patient notified that she can come in for a lab check.  Pt states that she can be here today at 2:00PM.  Dr. Marin Olp notified and message sent to scheduling.

## 2022-04-01 NOTE — Telephone Encounter (Signed)
Dr. Marin Olp notified of creat-3.95.  No new orders received at this time.

## 2022-04-01 NOTE — Telephone Encounter (Signed)
Pt notified that her HGB is 11.0 and no intervention is needed at this time.  Instructed pt that iron results would be back tomorrow.  Pt is appreciative of call and has no questions at this time.

## 2022-04-02 ENCOUNTER — Encounter: Payer: Self-pay | Admitting: *Deleted

## 2022-04-02 DIAGNOSIS — Z932 Ileostomy status: Secondary | ICD-10-CM | POA: Diagnosis not present

## 2022-04-02 LAB — IRON AND IRON BINDING CAPACITY (CC-WL,HP ONLY)
Iron: 61 ug/dL (ref 28–170)
Saturation Ratios: 15 % (ref 10.4–31.8)
TIBC: 400 ug/dL (ref 250–450)
UIBC: 339 ug/dL (ref 148–442)

## 2022-04-09 ENCOUNTER — Telehealth: Payer: Self-pay | Admitting: *Deleted

## 2022-04-09 ENCOUNTER — Other Ambulatory Visit: Payer: Self-pay | Admitting: Family

## 2022-04-09 ENCOUNTER — Inpatient Hospital Stay: Payer: Medicare Other

## 2022-04-09 ENCOUNTER — Inpatient Hospital Stay: Payer: Medicare Other | Attending: Hematology & Oncology

## 2022-04-09 VITALS — BP 114/71 | HR 88 | Temp 98.0°F | Resp 17

## 2022-04-09 DIAGNOSIS — D500 Iron deficiency anemia secondary to blood loss (chronic): Secondary | ICD-10-CM | POA: Insufficient documentation

## 2022-04-09 DIAGNOSIS — D649 Anemia, unspecified: Secondary | ICD-10-CM

## 2022-04-09 DIAGNOSIS — K51918 Ulcerative colitis, unspecified with other complication: Secondary | ICD-10-CM | POA: Diagnosis not present

## 2022-04-09 LAB — CBC WITH DIFFERENTIAL (CANCER CENTER ONLY)
Abs Immature Granulocytes: 0.4 10*3/uL — ABNORMAL HIGH (ref 0.00–0.07)
Basophils Absolute: 0.1 10*3/uL (ref 0.0–0.1)
Basophils Relative: 1 %
Eosinophils Absolute: 2.3 10*3/uL — ABNORMAL HIGH (ref 0.0–0.5)
Eosinophils Relative: 15 %
HCT: 33.7 % — ABNORMAL LOW (ref 36.0–46.0)
Hemoglobin: 10.7 g/dL — ABNORMAL LOW (ref 12.0–15.0)
Immature Granulocytes: 3 %
Lymphocytes Relative: 22 %
Lymphs Abs: 3.5 10*3/uL (ref 0.7–4.0)
MCH: 33.9 pg (ref 26.0–34.0)
MCHC: 31.8 g/dL (ref 30.0–36.0)
MCV: 106.6 fL — ABNORMAL HIGH (ref 80.0–100.0)
Monocytes Absolute: 0.7 10*3/uL (ref 0.1–1.0)
Monocytes Relative: 5 %
Neutro Abs: 8.9 10*3/uL — ABNORMAL HIGH (ref 1.7–7.7)
Neutrophils Relative %: 54 %
Platelet Count: 325 10*3/uL (ref 150–400)
RBC: 3.16 MIL/uL — ABNORMAL LOW (ref 3.87–5.11)
RDW: 15.9 % — ABNORMAL HIGH (ref 11.5–15.5)
WBC Count: 16 10*3/uL — ABNORMAL HIGH (ref 4.0–10.5)
nRBC: 0.5 % — ABNORMAL HIGH (ref 0.0–0.2)

## 2022-04-09 LAB — CMP (CANCER CENTER ONLY)
ALT: 20 U/L (ref 0–44)
AST: 19 U/L (ref 15–41)
Albumin: 4.2 g/dL (ref 3.5–5.0)
Alkaline Phosphatase: 92 U/L (ref 38–126)
Anion gap: 9 (ref 5–15)
BUN: 51 mg/dL — ABNORMAL HIGH (ref 8–23)
CO2: 18 mmol/L — ABNORMAL LOW (ref 22–32)
Calcium: 9.9 mg/dL (ref 8.9–10.3)
Chloride: 110 mmol/L (ref 98–111)
Creatinine: 3.39 mg/dL (ref 0.44–1.00)
GFR, Estimated: 14 mL/min — ABNORMAL LOW (ref >60)
Glucose, Bld: 128 mg/dL — ABNORMAL HIGH (ref 70–99)
Potassium: 4.1 mmol/L (ref 3.5–5.1)
Sodium: 137 mmol/L (ref 135–145)
Total Bilirubin: 0.3 mg/dL (ref 0.3–1.2)
Total Protein: 7.3 g/dL (ref 6.5–8.1)

## 2022-04-09 MED ORDER — EPOETIN ALFA-EPBX 40000 UNIT/ML IJ SOLN
40000.0000 [IU] | Freq: Once | INTRAMUSCULAR | Status: AC
  Administered 2022-04-09: 40000 [IU] via SUBCUTANEOUS
  Filled 2022-04-09: qty 1

## 2022-04-09 NOTE — Telephone Encounter (Signed)
Dr. Marin Olp notified of Creat-3.39.  No new orders received at this time.

## 2022-04-09 NOTE — Patient Instructions (Signed)

## 2022-04-12 DIAGNOSIS — Z961 Presence of intraocular lens: Secondary | ICD-10-CM | POA: Diagnosis not present

## 2022-04-14 ENCOUNTER — Encounter (INDEPENDENT_AMBULATORY_CARE_PROVIDER_SITE_OTHER): Payer: Self-pay

## 2022-04-15 DIAGNOSIS — N184 Chronic kidney disease, stage 4 (severe): Secondary | ICD-10-CM | POA: Diagnosis not present

## 2022-04-21 DIAGNOSIS — N2581 Secondary hyperparathyroidism of renal origin: Secondary | ICD-10-CM | POA: Diagnosis not present

## 2022-04-21 DIAGNOSIS — K5090 Crohn's disease, unspecified, without complications: Secondary | ICD-10-CM | POA: Diagnosis not present

## 2022-04-21 DIAGNOSIS — N184 Chronic kidney disease, stage 4 (severe): Secondary | ICD-10-CM | POA: Diagnosis not present

## 2022-04-21 DIAGNOSIS — E871 Hypo-osmolality and hyponatremia: Secondary | ICD-10-CM | POA: Diagnosis not present

## 2022-04-21 DIAGNOSIS — N179 Acute kidney failure, unspecified: Secondary | ICD-10-CM | POA: Diagnosis not present

## 2022-04-21 DIAGNOSIS — I129 Hypertensive chronic kidney disease with stage 1 through stage 4 chronic kidney disease, or unspecified chronic kidney disease: Secondary | ICD-10-CM | POA: Diagnosis not present

## 2022-04-21 DIAGNOSIS — N390 Urinary tract infection, site not specified: Secondary | ICD-10-CM | POA: Diagnosis not present

## 2022-04-21 DIAGNOSIS — D631 Anemia in chronic kidney disease: Secondary | ICD-10-CM | POA: Diagnosis not present

## 2022-04-21 DIAGNOSIS — R768 Other specified abnormal immunological findings in serum: Secondary | ICD-10-CM | POA: Diagnosis not present

## 2022-04-21 DIAGNOSIS — M109 Gout, unspecified: Secondary | ICD-10-CM | POA: Diagnosis not present

## 2022-04-21 DIAGNOSIS — D509 Iron deficiency anemia, unspecified: Secondary | ICD-10-CM | POA: Diagnosis not present

## 2022-04-21 DIAGNOSIS — E876 Hypokalemia: Secondary | ICD-10-CM | POA: Diagnosis not present

## 2022-05-07 ENCOUNTER — Other Ambulatory Visit: Payer: Self-pay | Admitting: Family

## 2022-05-07 DIAGNOSIS — D500 Iron deficiency anemia secondary to blood loss (chronic): Secondary | ICD-10-CM

## 2022-05-11 ENCOUNTER — Inpatient Hospital Stay: Payer: Medicare Other

## 2022-05-11 ENCOUNTER — Telehealth: Payer: Self-pay

## 2022-05-11 ENCOUNTER — Inpatient Hospital Stay: Payer: Medicare Other | Attending: Hematology & Oncology

## 2022-05-11 DIAGNOSIS — K51918 Ulcerative colitis, unspecified with other complication: Secondary | ICD-10-CM | POA: Insufficient documentation

## 2022-05-11 DIAGNOSIS — D500 Iron deficiency anemia secondary to blood loss (chronic): Secondary | ICD-10-CM | POA: Diagnosis not present

## 2022-05-11 LAB — CMP (CANCER CENTER ONLY)
ALT: 21 U/L (ref 0–44)
AST: 22 U/L (ref 15–41)
Albumin: 4.1 g/dL (ref 3.5–5.0)
Alkaline Phosphatase: 79 U/L (ref 38–126)
Anion gap: 12 (ref 5–15)
BUN: 39 mg/dL — ABNORMAL HIGH (ref 8–23)
CO2: 17 mmol/L — ABNORMAL LOW (ref 22–32)
Calcium: 9.3 mg/dL (ref 8.9–10.3)
Chloride: 108 mmol/L (ref 98–111)
Creatinine: 3.22 mg/dL (ref 0.44–1.00)
GFR, Estimated: 15 mL/min — ABNORMAL LOW (ref >60)
Glucose, Bld: 164 mg/dL — ABNORMAL HIGH (ref 70–99)
Potassium: 4.5 mmol/L (ref 3.5–5.1)
Sodium: 137 mmol/L (ref 135–145)
Total Bilirubin: 0.3 mg/dL (ref 0.3–1.2)
Total Protein: 7.6 g/dL (ref 6.5–8.1)

## 2022-05-11 LAB — CBC WITH DIFFERENTIAL (CANCER CENTER ONLY)
Abs Immature Granulocytes: 0.1 10*3/uL — ABNORMAL HIGH (ref 0.00–0.07)
Basophils Absolute: 0.1 10*3/uL (ref 0.0–0.1)
Basophils Relative: 1 %
Eosinophils Absolute: 0.8 10*3/uL — ABNORMAL HIGH (ref 0.0–0.5)
Eosinophils Relative: 7 %
HCT: 35.6 % — ABNORMAL LOW (ref 36.0–46.0)
Hemoglobin: 11.1 g/dL — ABNORMAL LOW (ref 12.0–15.0)
Immature Granulocytes: 1 %
Lymphocytes Relative: 27 %
Lymphs Abs: 2.9 10*3/uL (ref 0.7–4.0)
MCH: 34 pg (ref 26.0–34.0)
MCHC: 31.2 g/dL (ref 30.0–36.0)
MCV: 109.2 fL — ABNORMAL HIGH (ref 80.0–100.0)
Monocytes Absolute: 0.5 10*3/uL (ref 0.1–1.0)
Monocytes Relative: 4 %
Neutro Abs: 6.5 10*3/uL (ref 1.7–7.7)
Neutrophils Relative %: 60 %
Platelet Count: 308 10*3/uL (ref 150–400)
RBC: 3.26 MIL/uL — ABNORMAL LOW (ref 3.87–5.11)
RDW: 16.5 % — ABNORMAL HIGH (ref 11.5–15.5)
WBC Count: 10.8 10*3/uL — ABNORMAL HIGH (ref 4.0–10.5)
nRBC: 0.5 % — ABNORMAL HIGH (ref 0.0–0.2)

## 2022-05-11 NOTE — Telephone Encounter (Signed)
Critical creatinine of 3.22 received from lab, MD aware

## 2022-05-26 ENCOUNTER — Ambulatory Visit (INDEPENDENT_AMBULATORY_CARE_PROVIDER_SITE_OTHER): Payer: Medicare Other | Admitting: *Deleted

## 2022-05-26 ENCOUNTER — Ambulatory Visit: Payer: Medicare Other | Admitting: Nurse Practitioner

## 2022-05-26 DIAGNOSIS — Z0000 Encounter for general adult medical examination without abnormal findings: Secondary | ICD-10-CM | POA: Diagnosis not present

## 2022-05-26 NOTE — Progress Notes (Deleted)
Chief Complaint:  Abdominal pain    Assessment &  Plan      HPI   [name] is a 66 y.o. female known to Dr.  Havery Moros . She has a PMH of CKD, GERD, hiatal hernia, complicated Crohn's disease history. See PMH / Zap for additional history.   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 She was never able to be put in remission prior to colectomy. Post-operatively, she was on Remicade for 3-4 years but did not help too much. She had been on 6MP remotely but did not work for her. She was on Humira but also did not put her into remission and she developed high antibody titers and it was stopped. She was on Entyvio and low dose 6MP. 6MP was stopped due to worsening anemia.   She has been on budesonide with ? Relief.  She saw Jeanne Ivan and Dr. 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 Then saw Dr. Renelda Mom at [facility] and had diverting ileostomy April 23 2021. Returned for J pouch excision Jan 2023.   01/25/2022 last office visit She was doing much better other than some achiness and soreness of her abdomen sometimes.  She had healed from surgery.  She Dr. Havery Moros discussed surveillance ileoscopy but she was not interested in it at the time.  She preferred noninvasive stool testing.  Fecal calprotectin was normal at 24.    Interval History:          Prior workup:  Pouchoscopy 03/31/17 - anal fissure, mild anal canal stenosis, focal ulceration of pouch and more proximal ileum but improved compared to previous   Pouchoscopy 01/21/20 -  Preparation of the colon was poor. - A suspected small perianal fistulous tract and anal canal stenosis found on digital rectal exam. - The ileal pouch has an angulated turn with very focal ulcerations, otherwise no inflammatory changes noted. biopsies obtained - Small area of nodularity  along anal canal - biopsied to rule out AIN / condyloma - Suspected fistal of the distal pouch just proximal to anal canal Overall, only one focal ulceration noted and no other inflammatory changes seen. Incidentally noted suspected new fistula   EGD 01/21/20 -  - A 3 cm hiatal hernia was present. - LA Grade A esophagitis was found 33 cm from the incisors - mild. - The exam of the esophagus was otherwise normal. No Barrett's esophagus. - Multiple small sessile polyps were found in the gastric fundus and in the gastric body, benign appearing, likely fundic gland polyps, a cluster was noted at the distal end of the hernia sac. Biopsies were taken with a cold forceps for histology. - The exam of the stomach was otherwise normal. - The duodenal bulb and second portion of the duodenum were normal.   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 Cellulitis extends superiorly throughout the posterior subcutaneous fat, adjacent to the gluteal and posterior paraspinal muscles. No evidence of abscess.     MRE 06/25/20 - IMPRESSION: 1. Persistent inflammatory changes near the upper gluteal cleft bilaterally between the gluteus maximus muscles. No discrete drainable soft tissue abscess or intramuscular involvement. 2. No MR findings for acute inflammatory small-bowel disease.  However, study is limited without contrast.   CT scan abdomen/ pelvis without contrast 05/01/21: IMPRESSION: 1. Findings worrisome for an area of focal bowel perforation versus anastomosis leak within the anterior aspect of the mid abdomen, with extravasation of air and contrast into the adjacent surgical incision site. 2. Postoperative changes within subsequent right lower quadrant ileostomy. 3. Aortic atherosclerosis.   CT scan abdomen / pelvis without contrast  05/14/21: IMPRESSION: Status post proctocolectomy. Ileostomy is noted in right lower quadrant. There is no evidence of bowel obstruction seen at this time.   No definite acute abnormality is seen in the abdomen or pelvis at this time.     CT abdomen / pelvis without contrast 09/22/21: IMPRESSION: 1. Patient is status post colectomy with right lower quadrant ileostomy. Small bowel loops in the upper abdomen and stomach appeared dilated concerning for small bowel obstruction. Transition point is not definitive, but may be at the level of the ostomy. No free air or pneumatosis. 2. There is wall thickening of the distal esophagus and proximal stomach worrisome for gastritis and esophagitis. 3. Small amount of air in the pelvis is unchanged from prior, likely postoperative. 4. Nonobstructing left renal calculus     Imaging     Labs:     Latest Ref Rng & Units 05/11/2022   10:56 AM 04/09/2022   10:27 AM 04/01/2022    1:54 PM  CBC  WBC 4.0 - 10.5 K/uL 10.8  16.0  13.5   Hemoglobin 12.0 - 15.0 g/dL 11.1  10.7  11.0   Hematocrit 36.0 - 46.0 % 35.6  33.7  34.7   Platelets 150 - 400 K/uL 308  325  333        Latest Ref Rng & Units 05/11/2022   10:56 AM 04/09/2022   10:27 AM 04/01/2022    1:54 PM  Hepatic Function  Total Protein 6.5 - 8.1 g/dL 7.6  7.3  7.9   Albumin 3.5 - 5.0 g/dL 4.1  4.2  4.4   AST 15 - 41 U/L 22  19  21    ALT 0 - 44 U/L 21  20  22    Alk Phosphatase 38 - 126 U/L 79  92  101   Total Bilirubin 0.3 - 1.2 mg/dL 0.3  0.3  0.3      Past Medical History:  Diagnosis Date   Abdominal pain    Hx   Allergy    Anal stenosis    Anemia    Anxiety    Arthritis    Asthma    patient does not have inhaler   Blood in stool    Hx   Blood in urine    Hx   Blood transfusion without reported diagnosis    Cataract    CKD (chronic kidney disease) stage 3, GFR 30-59 ml/min (HCC)    Crohn's colitis, other complication (HCC)    De Quervain's tenosynovitis    Depression     Difficulty urinating    Hx   Easy bruising    Esophagitis    Fainting    History - resolved - due to dehydration   Fatigue    Hx   Fibroid    Hx   Gastric polyp    GERD (gastroesophageal reflux disease)    Hearing loss    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   Hemorrhoids, external    Hemorrhoids, internal    Herpes, genital    vaginal treated 07/05/12 and pt  states is resolved   History of cervical dysplasia    History of small bowel obstruction    Hyperlipidemia    currently no meds   Hyperparathyroidism    Hypertension    Hypokalemia    Hx   IBD (inflammatory bowel disease)    initially colectomy for suspected UC, 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   Obesity    Ovarian cyst    Poor dental hygiene    Pulmonary nodule, right    right upper lobe   Rectal bleeding    Hx   Rectal pain    Hx   Renal insufficiency    CKD - stage 3   RLS (restless legs syndrome)    no meds   Tooth infection 11/2016   right low   Ulcerative colitis    Visual disturbance    wears glasses   Weakness generalized    Hx - patient denies generalized weakness   Wears dentures    upper only    Past Surgical History:  Procedure Laterality Date   ANAL DILATION     CERVICAL BIOPSY  W/ LOOP ELECTRODE EXCISION     CHOLECYSTECTOMY     COLONOSCOPY     [name]   fatty tumor removed from back     X 2   HEMORRHOID SURGERY     ILEOSTOMY CLOSURE     RESTORATIVE PROCTOCOLECTOMY     with insertion of ileoanal J Pouch with loop ileostomy   SHOULDER ARTHROSCOPY WITH CAPSULORRHAPHY Left 06/14/2019   Procedure: LEFT SHOULDER ARTHRSCOPIC REPAIR OF BONY BANKART FRACTURE;  Surgeon: Tania Ade, MD;  Location: [facility];  Service: Orthopedics;  Laterality: Left;  REQUEST 90 MINUTE   SIGMOIDOSCOPY     TOTAL ABDOMINAL HYSTERECTOMY  1998   TAH/LSO   TUBAL LIGATION     UPPER GASTROINTESTINAL ENDOSCOPY     [name]    Current Medications, Allergies, Family History and Social History  were reviewed in [employer] record.     Current Outpatient Medications  Medication Sig Dispense Refill   acetaminophen (TYLENOL) 500 MG tablet Take 500-1,000 mg by mouth at bedtime.     allopurinol (ZYLOPRIM) 300 MG tablet Take 1 tablet (300 mg total) by mouth daily. 90 tablet 1   Calcium Carbonate Antacid (TUMS EXTRA STRENGTH 750 PO) Take 1-2 tablets by mouth See admin instructions. Chew 1-2 tablets by mouth as needed for heartburn or reflux     DULoxetine (CYMBALTA) 60 MG capsule TAKE 2 CAPSULES BY MOUTH EVERY DAY 60 capsule 0   famotidine (PEPCID) 20 MG tablet TAKE 1 TABLET BY MOUTH TWICE A DAY (Patient taking differently: Take 20 mg by mouth daily as needed for heartburn or indigestion.) 034 tablet 1   folic acid (FOLVITE) 1 MG tablet Take 2 tablets (2 mg total) by mouth daily. (Patient taking differently: Take 1-2 mg by mouth daily.) 180 tablet 3   metoprolol succinate (TOPROL-XL) 100 MG 24 hr tablet Take 50-100 mg by mouth See admin instructions. Take 100 mg by mouth in the morning and 50 mg at bedtime     Vitamin D, Ergocalciferol, (DRISDOL) 1.25 MG (50000 UNIT) CAPS capsule Take 50,000 Units by mouth every 30 (thirty) days.     No current facility-administered medications for this visit.    Review of Systems: No chest pain. No shortness of breath. No urinary complaints.    Physical Exam  Wt Readings from Last 3 Encounters:  03/10/22 178 lb 0.8  oz (80.8 kg)  03/05/22 178 lb 3.2 oz (80.8 kg)  01/25/22 174 lb 9.6 oz (79.2 kg)    There were no vitals taken for this visit. Constitutional:  Generally well appearing ***female in no acute distress. Psychiatric: Pleasant. Normal mood and affect. Behavior is normal. EENT: Pupils normal.  Conjunctivae are normal. No scleral icterus. Neck supple.  Cardiovascular: Normal rate, regular rhythm. No edema Pulmonary/chest: Effort normal and breath sounds normal. No wheezing, rales or rhonchi. Abdominal: Soft,  nondistended, nontender. Bowel sounds active throughout. There are no masses palpable. No hepatomegaly. Neurological: Alert and oriented to person place and time. Skin: Skin is warm and dry. No rashes noted.  Tye Savoy, NP  05/26/2022, 8:10 AM  Cc:  Carollee Herter, Alferd Apa, *

## 2022-05-26 NOTE — Patient Instructions (Signed)
[name] , Thank you for taking time to come for your Medicare Wellness Visit. I appreciate your ongoing commitment to your health goals. Please review the following plan we discussed and let me know if I can assist you in the future.   These are the goals we discussed:  Goals       Lose 20 lbs by next year.   (pt-stated)      Increase physical activity-walking         This is a list of the screening recommended for you and due dates:  Health Maintenance  Topic Date Due   COVID-19 Vaccine (1) Never done   Complete foot exam   Never done   Eye exam for diabetics  Never done   Zoster (Shingles) Vaccine (1 of 2) Never done   Colon Cancer Screening  06/02/2016   Mammogram  10/13/2019   Pneumonia Vaccine (3 - PPSV23 or PCV20) 06/03/2020   Hemoglobin A1C  02/21/2021   Tetanus Vaccine  09/12/2021   Flu Shot  04/06/2022   Yearly kidney health urinalysis for diabetes  01/21/2023   Yearly kidney function blood test for diabetes  05/12/2023   DEXA scan (bone density measurement)  Completed   Hepatitis C Screening: USPSTF Recommendation to screen - Ages 67-79 yo.  Completed   HPV Vaccine  Aged Out      Next appointment: Follow up in one year for your annual wellness visit    Preventive Care 65 Years and Older, Female Preventive care refers to lifestyle choices and visits with your health care provider that can promote health and wellness. What does preventive care include? A yearly physical exam. This is also called an annual well check. Dental exams once or twice a year. Routine eye exams. Ask your health care provider how often you should have your eyes checked. Personal lifestyle choices, including: Daily care of your teeth and gums. Regular physical activity. Eating a healthy diet. Avoiding tobacco and drug use. Limiting alcohol use. Practicing safe sex. Taking low-dose aspirin every day. Taking vitamin and mineral supplements as recommended by your health care  provider. What happens during an annual well check? The services and screenings done by your health care provider during your annual well check will depend on your age, overall health, lifestyle risk factors, and family history of disease. Counseling  Your health care provider may ask you questions about your: Alcohol use. Tobacco use. Drug use. Emotional well-being. Home and relationship well-being. Sexual activity. Eating habits. History of falls. Memory and ability to understand (cognition). Work and work Statistician. Reproductive health. Screening  You may have the following tests or measurements: Height, weight, and BMI. Blood pressure. Lipid and cholesterol levels. These may be checked every 5 years, or more frequently if you are over 93 years old. Skin check. Lung cancer screening. You may have this screening every year starting at age 82 if you have a 30-pack-year history of smoking and currently smoke or have quit within the past 15 years. Fecal occult blood test (FOBT) of the stool. You may have this test every year starting at age 44. Flexible sigmoidoscopy or colonoscopy. You may have a sigmoidoscopy every 5 years or a colonoscopy every 10 years starting at age 25. Hepatitis C blood test. Hepatitis B blood test. Sexually transmitted disease (STD) testing. Diabetes screening. This is done by checking your blood sugar (glucose) after you have not eaten for a while (fasting). You may have this done every 1-3 years. Bone density  scan. This is done to screen for osteoporosis. You may have this done starting at age 66. Mammogram. This may be done every 1-2 years. Talk to your health care provider about how often you should have regular mammograms. Talk with your health care provider about your test results, treatment options, and if necessary, the need for more tests. Vaccines  Your health care provider may recommend certain vaccines, such as: Influenza vaccine. This is  recommended every year. Tetanus, diphtheria, and acellular pertussis (Tdap, Td) vaccine. You may need a Td booster every 10 years. Zoster vaccine. You may need this after age 39. Pneumococcal 13-valent conjugate (PCV13) vaccine. One dose is recommended after age 42. Pneumococcal polysaccharide (PPSV23) vaccine. One dose is recommended after age 65. Talk to your health care provider about which screenings and vaccines you need and how often you need them. This information is not intended to replace advice given to you by your health care provider. Make sure you discuss any questions you have with your health care provider. Document Released: 09/19/2015 Document Revised: 05/12/2016 Document Reviewed: 06/24/2015 Elsevier Interactive Patient Education  2017 [place] Prevention in the Home Falls can cause injuries. They can happen to people of all ages. There are many things you can do to make your home safe and to help prevent falls. What can I do on the outside of my home? Regularly fix the edges of walkways and driveways and fix any cracks. Remove anything that might make you trip as you walk through a door, such as a raised step or threshold. Trim any bushes or trees on the path to your home. Use bright outdoor lighting. Clear any walking paths of anything that might make someone trip, such as rocks or tools. Regularly check to see if handrails are loose or broken. Make sure that both sides of any steps have handrails. Any raised decks and porches should have guardrails on the edges. Have any leaves, snow, or ice cleared regularly. Use sand or salt on walking paths during winter. Clean up any spills in your garage right away. This includes oil or grease spills. What can I do in the bathroom? Use night lights. Install grab bars by the toilet and in the tub and shower. Do not use towel bars as grab bars. Use non-skid mats or decals in the tub or shower. If you need to sit down in  the shower, use a plastic, non-slip stool. Keep the floor dry. Clean up any water that spills on the floor as soon as it happens. Remove soap buildup in the tub or shower regularly. Attach bath mats securely with double-sided non-slip rug tape. Do not have throw rugs and other things on the floor that can make you trip. What can I do in the bedroom? Use night lights. Make sure that you have a light by your bed that is easy to reach. Do not use any sheets or blankets that are too big for your bed. They should not hang down onto the floor. Have a firm chair that has side arms. You can use this for support while you get dressed. Do not have throw rugs and other things on the floor that can make you trip. What can I do in the kitchen? Clean up any spills right away. Avoid walking on wet floors. Keep items that you use a lot in easy-to-reach places. If you need to reach something above you, use a strong step stool that has a grab bar. Keep electrical  cords out of the way. Do not use floor polish or wax that makes floors slippery. If you must use wax, use non-skid floor wax. Do not have throw rugs and other things on the floor that can make you trip. What can I do with my stairs? Do not leave any items on the stairs. Make sure that there are handrails on both sides of the stairs and use them. Fix handrails that are broken or loose. Make sure that handrails are as long as the stairways. Check any carpeting to make sure that it is firmly attached to the stairs. Fix any carpet that is loose or worn. Avoid having throw rugs at the top or bottom of the stairs. If you do have throw rugs, attach them to the floor with carpet tape. Make sure that you have a light switch at the top of the stairs and the bottom of the stairs. If you do not have them, ask someone to add them for you. What else can I do to help prevent falls? Wear shoes that: Do not have high heels. Have rubber bottoms. Are comfortable  and fit you well. Are closed at the toe. Do not wear sandals. If you use a stepladder: Make sure that it is fully opened. Do not climb a closed stepladder. Make sure that both sides of the stepladder are locked into place. Ask someone to hold it for you, if possible. Clearly mark and make sure that you can see: Any grab bars or handrails. First and last steps. Where the edge of each step is. Use tools that help you move around (mobility aids) if they are needed. These include: Canes. Walkers. Scooters. Crutches. Turn on the lights when you go into a dark area. Replace any light bulbs as soon as they burn out. Set up your furniture so you have a clear path. Avoid moving your furniture around. If any of your floors are uneven, fix them. If there are any pets around you, be aware of where they are. Review your medicines with your doctor. Some medicines can make you feel dizzy. This can increase your chance of falling. Ask your doctor what other things that you can do to help prevent falls. This information is not intended to replace advice given to you by your health care provider. Make sure you discuss any questions you have with your health care provider. Document Released: 06/19/2009 Document Revised: 01/29/2016 Document Reviewed: 09/27/2014 Elsevier Interactive Patient Education  2017 [employer].

## 2022-05-26 NOTE — Progress Notes (Addendum)
Subjective:   [name] is a 66 y.o. female who presents for Medicare Annual (Subsequent) preventive examination. I connected with  [name] on 05/26/22 by a audio enabled telemedicine application and verified that I am speaking with the correct person using two identifiers.  Patient Location: Home  Provider Location: Office/Clinic  I discussed the limitations of evaluation and management by telemedicine. The patient expressed understanding and agreed to proceed.   Review of Systems    Defer to PCP Cardiac Risk Factors include: advanced age (>41mn, >>47women);dyslipidemia;hypertension     Objective:    There were no vitals filed for this visit. There is no height or weight on file to calculate BMI.     05/26/2022    1:51 PM 03/10/2022   10:52 AM 09/22/2021    1:56 PM 09/20/2021    7:40 PM 05/26/2021    2:02 PM 05/01/2021    2:47 PM 12/08/2020    4:18 AM  Advanced Directives  Does Patient Have a Medical Advance Directive? No No No No No No   Would patient like information on creating a medical advance directive? No - Patient declined No - Patient declined No - Patient declined No - Patient declined  No - Patient declined No - Patient declined    Current Medications (verified) Outpatient Encounter Medications as of 05/26/2022  Medication Sig   acetaminophen (TYLENOL) 500 MG tablet Take 500-1,000 mg by mouth at bedtime.   allopurinol (ZYLOPRIM) 300 MG tablet Take 1 tablet (300 mg total) by mouth daily.   Calcium Carbonate Antacid (TUMS EXTRA STRENGTH 750 PO) Take 1-2 tablets by mouth See admin instructions. Chew 1-2 tablets by mouth as needed for heartburn or reflux   DULoxetine (CYMBALTA) 60 MG capsule TAKE 2 CAPSULES BY MOUTH EVERY DAY   famotidine (PEPCID) 20 MG tablet TAKE 1 TABLET BY MOUTH TWICE A DAY (Patient taking differently: Take 20 mg by mouth daily as needed for heartburn or indigestion.)   folic acid (FOLVITE) 1 MG tablet Take 2 tablets (2 mg total) by  mouth daily. (Patient taking differently: Take 1-2 mg by mouth daily.)   metoprolol succinate (TOPROL-XL) 100 MG 24 hr tablet Take 50-100 mg by mouth See admin instructions. Take 100 mg by mouth in the morning and 50 mg at bedtime   Vitamin D, Ergocalciferol, (DRISDOL) 1.25 MG (50000 UNIT) CAPS capsule Take 50,000 Units by mouth every 30 (thirty) days.   No facility-administered encounter medications on file as of 05/26/2022.    Allergies (verified) Sulfa antibiotics, Morphine, and Sulfonamide derivatives   History: Past Medical History:  Diagnosis Date   Abdominal pain    Hx   Allergy    Anal stenosis    Anemia    Anxiety    Arthritis    Asthma    patient does not have inhaler   Blood in stool    Hx   Blood in urine    Hx   Blood transfusion without reported diagnosis    Cataract    CKD (chronic kidney disease) stage 3, GFR 30-59 ml/min (HCC)    Crohn's colitis, other complication (HCC)    De Quervain's tenosynovitis    Depression    Difficulty urinating    Hx   Easy bruising    Esophagitis    Fainting    History - resolved - due to dehydration   Fatigue    Hx   Fibroid    Hx   Gastric polyp  GERD (gastroesophageal reflux disease)    Hearing loss    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   Hemorrhoids, external    Hemorrhoids, internal    Herpes, genital    vaginal treated 07/05/12 and pt states is resolved   History of cervical dysplasia    History of small bowel obstruction    Hyperlipidemia    currently no meds   Hyperparathyroidism    Hypertension    Hypokalemia    Hx   IBD (inflammatory bowel disease)    initially colectomy for suspected UC, 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   Obesity    Ovarian cyst    Poor dental hygiene    Pulmonary nodule, [name]    [name] upper lobe   Rectal bleeding    Hx   Rectal pain    Hx   Renal insufficiency    CKD - stage 3   RLS (restless legs syndrome)    no meds   Tooth infection 11/2016    [name] low   Ulcerative colitis    Visual disturbance    wears glasses   Weakness generalized    Hx - patient denies generalized weakness   Wears dentures    upper only   Past Surgical History:  Procedure Laterality Date   ANAL DILATION     CERVICAL BIOPSY  W/ LOOP ELECTRODE EXCISION     CHOLECYSTECTOMY     COLONOSCOPY     [name]   fatty tumor removed from back     X 2   HEMORRHOID SURGERY     ILEOSTOMY CLOSURE     RESTORATIVE PROCTOCOLECTOMY     with insertion of ileoanal J Pouch with loop ileostomy   SHOULDER ARTHROSCOPY WITH CAPSULORRHAPHY Left 06/14/2019   Procedure: LEFT SHOULDER ARTHRSCOPIC REPAIR OF BONY BANKART FRACTURE;  Surgeon: Tania Ade, MD;  Location: [facility];  Service: Orthopedics;  Laterality: Left;  REQUEST 18 MINUTE   SIGMOIDOSCOPY     TOTAL ABDOMINAL HYSTERECTOMY  1998   TAH/LSO   TUBAL LIGATION     UPPER GASTROINTESTINAL ENDOSCOPY     [name]   Family History  Problem Relation Age of Onset   Ulcerative colitis Father    Hypertension Father    Heart attack Father    Hypertension Mother    Heart disease Mother        s/p pci   Ulcerative colitis Daughter    Irritable bowel syndrome Other        grandchildren   Diabetes Sister    Cancer Sister        uterine   Cancer Maternal Uncle        LUNG   Colon cancer Neg Hx    Esophageal cancer Neg Hx    Stomach cancer Neg Hx    Rectal cancer Neg Hx    Social History   Socioeconomic History   Marital status: Divorced    Spouse name: Not on file   Number of children: Not on file   Years of education: Not on file   Highest education level: Not on file  Occupational History   Not on file  Tobacco Use   Smoking status: Former    Packs/day: 1.00    Years: 4.00    Total pack years: 4.00    Types: Cigarettes    Quit date: 05/21/1979    Years since quitting: 43.0   Smokeless tobacco: Never  Vaping Use   Vaping Use:  Never used  Substance and Sexual Activity   Alcohol use: No    Alcohol/week:  0.0 standard drinks of alcohol   Drug use: No   Sexual activity: Yes    Birth control/protection: Post-menopausal    Comment: 1st intercourse 66 yo-Fewer than 5 partners  Other Topics Concern   Not on file  Social History Narrative   Not on file   Social Determinants of Health   Financial Resource Strain: Low Risk  (05/26/2022)   Overall Financial Resource Strain (CARDIA)    Difficulty of Paying Living Expenses: Not hard at all  Food Insecurity: No Food Insecurity (05/26/2022)   Hunger Vital Sign    Worried About Running Out of Food in the Last Year: Never true    Ran Out of Food in the Last Year: Never true  Transportation Needs: No Transportation Needs (05/26/2022)   PRAPARE - Hydrologist (Medical): No    Lack of Transportation (Non-Medical): No  Physical Activity: Inactive (05/26/2022)   Exercise Vital Sign    Days of Exercise per Week: 0 days    Minutes of Exercise per Session: 0 min  Stress: No Stress Concern Present (05/26/2022)   [place]    Feeling of Stress : Not at all  Social Connections: Moderately Isolated (05/26/2022)   Social Connection and Isolation Panel [NHANES]    Frequency of Communication with Friends and Family: More than three times a week    Frequency of Social Gatherings with Friends and Family: More than three times a week    Attends Religious Services: Never    Marine scientist or Organizations: No    Attends Music therapist: Never    Marital Status: Married    Tobacco Counseling Counseling given: Not Answered   Clinical Intake:  Pre-visit preparation completed: Yes  Pain : No/denies pain     Diabetes: No  How often do you need to have someone help you when you read instructions, pamphlets, or other written materials from your doctor or pharmacy?: 1 - Never  Diabetic? No   Activities of Daily Living    05/26/2022    1:52  PM  In your present state of health, do you have any difficulty performing the following activities:  Hearing? 0  Vision? 0  Difficulty concentrating or making decisions? 0  Walking or climbing stairs? 0  Dressing or bathing? 0  Doing errands, shopping? 0  Preparing Food and eating ? N  Using the Toilet? N  In the past six months, have you accidently leaked urine? Y  Do you have problems with loss of bowel control? N  Comment pt has iliostomy  Managing your Medications? N  Managing your Finances? N  Housekeeping or managing your Housekeeping? N    Patient Care Team: Carollee Herter, Alferd Apa, DO as PCP - General Donato Heinz, MD (Nephrology) Armbruster, Carlota Raspberry, MD as Consulting Physician (Gastroenterology) Terrance Mass, MD (Inactive) as Consulting Physician (Gynecology) Rutherford Guys, MD as Consulting Physician (Ophthalmology) Nahser, Wonda Cheng, MD as Consulting Physician (Cardiology) Malena Catholic, MD as Referring Physician Marin Olp Rudell Cobb, MD as Consulting Physician (Oncology)  Indicate any recent Medical Services you may have received from other than [facility] providers in the past year (date may be approximate).     Assessment:   This is a routine wellness examination for [facility].  Hearing/Vision screen No results found.  Dietary issues and exercise activities discussed:  Current Exercise Habits: The patient does not participate in regular exercise at present (walks through the yard taking care of her ducks and chickens), Exercise limited by: None identified   Goals Addressed   None    Depression Screen    05/26/2022    1:51 PM 03/05/2022   11:10 AM 03/10/2021    3:26 PM 10/23/2020   10:31 AM 12/17/2019   10:59 AM 06/07/2019    8:10 AM 06/05/2018    9:11 AM  PHQ 2/9 Scores  PHQ - 2 Score 0 3 3 0 6 0 0  PHQ- 9 Score  5 6 0 21      Fall Risk    05/26/2022    1:51 PM 03/05/2022   11:09 AM 10/23/2020   10:31 AM 06/07/2019    8:10 AM 06/05/2018    9:11 AM  Fall  Risk   Falls in the past year? 1 0 0 1 Yes  Number falls in past yr: 0 0  1 1  Injury with Fall? 0 0 0 1   Risk for fall due to : No Fall Risks Impaired balance/gait;Impaired mobility     Follow up Falls evaluation completed   Education provided;Falls prevention discussed Education provided;Falls prevention discussed    FALL RISK PREVENTION PERTAINING TO THE HOME:  Any stairs in or around the home? Yes  If so, are there any without handrails? No  Home free of loose throw rugs in walkways, pet beds, electrical cords, etc? Yes  Adequate lighting in your home to reduce risk of falls? Yes   ASSISTIVE DEVICES UTILIZED TO PREVENT FALLS:  Life alert? No  Use of a cane, walker or w/c? No  Grab bars in the bathroom? No  Shower chair or bench in shower? Yes  Elevated toilet seat or a handicapped toilet? No   TIMED UP AND GO:  Was the test performed? No , audio visit  Cognitive Function:    06/05/2018    9:14 AM 06/02/2017    9:02 AM  MMSE - Mini Mental State Exam  Orientation to time 5 5  Orientation to Place 5 5  Registration 3 3  Attention/ Calculation 5 5  Recall 3 3  Language- name 2 objects 2 2  Language- repeat 1 1  Language- follow 3 step command 3 3  Language- read & follow direction 1 1  Write a sentence 1 1  Copy design 1 1  Total score 30 30        05/26/2022    1:58 PM  6CIT Screen  What Year? 0 points  What month? 0 points  What time? 0 points  Count back from 20 0 points  Months in reverse 0 points  Repeat phrase 0 points  Total Score 0 points    Immunizations Immunization History  Administered Date(s) Administered   Influenza Split 08/03/2011   Influenza Whole 08/15/2007, 07/05/2008, 07/29/2009, 08/19/2010, 08/19/2012   Influenza,inj,Quad PF,6+ Mos 07/23/2013, 06/12/2014, 06/02/2015   Influenza-Unspecified 06/02/2016, 06/23/2019   PPD Test 06/07/2011, 06/13/2012, 09/14/2013   Pneumococcal Conjugate-13 06/04/2019   Pneumococcal Polysaccharide-23  07/23/2013   Tdap 09/13/2011    TDAP status: Due, Education has been provided regarding the importance of this vaccine. Advised may receive this vaccine at local pharmacy or Health Dept. Aware to provide a copy of the vaccination record if obtained from local pharmacy or Health Dept. Verbalized acceptance and understanding.  Flu Vaccine status: Due, Education has been provided regarding the importance of this vaccine. Advised  may receive this vaccine at local pharmacy or Health Dept. Aware to provide a copy of the vaccination record if obtained from local pharmacy or Health Dept. Verbalized acceptance and understanding.  Pneumococcal vaccine status: Due, Education has been provided regarding the importance of this vaccine. Advised may receive this vaccine at local pharmacy or Health Dept. Aware to provide a copy of the vaccination record if obtained from local pharmacy or Health Dept. Verbalized acceptance and understanding.  Covid-19 vaccine status: Information provided on how to obtain vaccines.   Qualifies for Shingles Vaccine? Yes   Zostavax completed No   Shingrix Completed?: No.    Education has been provided regarding the importance of this vaccine. Patient has been advised to call insurance company to determine out of pocket expense if they have not yet received this vaccine. Advised may also receive vaccine at local pharmacy or Health Dept. Verbalized acceptance and understanding.  Screening Tests Health Maintenance  Topic Date Due   COVID-19 Vaccine (1) Never done   FOOT EXAM  Never done   OPHTHALMOLOGY EXAM  Never done   Zoster Vaccines- Shingrix (1 of 2) Never done   COLONOSCOPY (Pts 45-12yr Insurance coverage will need to be confirmed)  06/02/2016   MAMMOGRAM  10/13/2019   Pneumonia Vaccine 68 Years old (3 - PPSV23 or PCV20) 06/03/2020   HEMOGLOBIN A1C  02/21/2021   TETANUS/TDAP  09/12/2021   INFLUENZA VACCINE  04/06/2022   Diabetic kidney evaluation - Urine ACR   01/21/2023   Diabetic kidney evaluation - GFR measurement  05/12/2023   DEXA SCAN  Completed   Hepatitis C Screening  Completed   HPV VACCINES  Aged Out    Health Maintenance  Health Maintenance Due  Topic Date Due   COVID-19 Vaccine (1) Never done   FOOT EXAM  Never done   OPHTHALMOLOGY EXAM  Never done   Zoster Vaccines- Shingrix (1 of 2) Never done   COLONOSCOPY (Pts 45-441yrInsurance coverage will need to be confirmed)  06/02/2016   MAMMOGRAM  10/13/2019   Pneumonia Vaccine 6560Years old (3 - PPSV23 or PCV20) 06/03/2020   HEMOGLOBIN A1C  02/21/2021   TETANUS/TDAP  09/12/2021   INFLUENZA VACCINE  04/06/2022    Colorectal cancer screening: Referral to GI placed N/a. Pt aware the office will call re: appt.  Mammogram status: Ordered N/a. Pt provided with contact info and advised to call to schedule appt.  Pt will contact [facility] to get scheduled.  Bone Density status: Completed 10/30/20. Results reflect: Bone density results: OSTEOPENIA. Repeat every 2 years.  Lung Cancer Screening: (Low Dose CT Chest recommended if Age 68-80ears, 30 pack-year currently smoking OR have quit w/in 15years.) does not qualify.   Lung Cancer Screening Referral: N/a  Additional Screening:  Hepatitis C Screening: does qualify; Completed 05/10/17  Vision Screening: Recommended annual ophthalmology exams for early detection of glaucoma and other disorders of the eye. Is the patient up to date with their annual eye exam?  Yes  Who is the provider or what is the name of the office in which the patient attends annual eye exams? Dr. ShGershon Cranef pt is not established with a provider, would they like to be referred to a provider to establish care? No .   Dental Screening: Recommended annual dental exams for proper oral hygiene  Community Resource Referral / Chronic Care Management: CRR required this visit?  No   CCM required this visit?  No      Plan:  I have personally reviewed and noted the  following in the patient's chart:   Medical and social history Use of alcohol, tobacco or illicit drugs  Current medications and supplements including opioid prescriptions. Patient is not currently taking opioid prescriptions. Functional ability and status Nutritional status Physical activity Advanced directives List of other physicians Hospitalizations, surgeries, and ER visits in previous 12 months Vitals Screenings to include cognitive, depression, and falls Referrals and appointments  In addition, I have reviewed and discussed with patient certain preventive protocols, quality metrics, and best practice recommendations. A written personalized care plan for preventive services as well as general preventive health recommendations were provided to patient.   Due to this being a telephonic visit, the after visit summary with patients personalized plan was offered to patient via mail or my-chart. Patient would like to access on my-chart.  Beatris Ship, [place]   05/26/2022   Nurse Notes: None  I have reviewed and agree with [facility] documentation.  Kathlene November, MD

## 2022-06-11 ENCOUNTER — Other Ambulatory Visit: Payer: Medicare Other

## 2022-06-11 ENCOUNTER — Inpatient Hospital Stay: Payer: Medicare Other

## 2022-06-11 ENCOUNTER — Ambulatory Visit: Payer: Medicare Other | Admitting: Family

## 2022-06-17 ENCOUNTER — Inpatient Hospital Stay: Payer: Medicare Other | Admitting: Medical Oncology

## 2022-06-17 ENCOUNTER — Inpatient Hospital Stay: Payer: Medicare Other

## 2022-06-17 ENCOUNTER — Inpatient Hospital Stay: Payer: Medicare Other | Attending: Hematology & Oncology

## 2022-06-24 DIAGNOSIS — N390 Urinary tract infection, site not specified: Secondary | ICD-10-CM | POA: Diagnosis not present

## 2022-06-24 DIAGNOSIS — D509 Iron deficiency anemia, unspecified: Secondary | ICD-10-CM | POA: Diagnosis not present

## 2022-06-24 DIAGNOSIS — N184 Chronic kidney disease, stage 4 (severe): Secondary | ICD-10-CM | POA: Diagnosis not present

## 2022-06-24 DIAGNOSIS — N179 Acute kidney failure, unspecified: Secondary | ICD-10-CM | POA: Diagnosis not present

## 2022-06-25 DIAGNOSIS — Z932 Ileostomy status: Secondary | ICD-10-CM | POA: Diagnosis not present

## 2022-06-28 DIAGNOSIS — Z932 Ileostomy status: Secondary | ICD-10-CM | POA: Diagnosis not present

## 2022-06-29 ENCOUNTER — Inpatient Hospital Stay: Payer: Medicare Other

## 2022-06-29 ENCOUNTER — Emergency Department (HOSPITAL_BASED_OUTPATIENT_CLINIC_OR_DEPARTMENT_OTHER): Payer: Medicare Other

## 2022-06-29 ENCOUNTER — Encounter (HOSPITAL_BASED_OUTPATIENT_CLINIC_OR_DEPARTMENT_OTHER): Payer: Self-pay | Admitting: Internal Medicine

## 2022-06-29 ENCOUNTER — Inpatient Hospital Stay (HOSPITAL_BASED_OUTPATIENT_CLINIC_OR_DEPARTMENT_OTHER)
Admission: EM | Admit: 2022-06-29 | Discharge: 2022-07-01 | DRG: 872 | Disposition: A | Discharge status: Home / Self Care | Payer: Medicare Other | Attending: Family Medicine | Admitting: Family Medicine

## 2022-06-29 ENCOUNTER — Other Ambulatory Visit: Payer: Self-pay

## 2022-06-29 ENCOUNTER — Inpatient Hospital Stay: Payer: Medicare Other | Admitting: Medical Oncology

## 2022-06-29 ENCOUNTER — Telehealth: Payer: Self-pay

## 2022-06-29 ENCOUNTER — Encounter (HOSPITAL_COMMUNITY): Payer: Self-pay

## 2022-06-29 DIAGNOSIS — Z87891 Personal history of nicotine dependence: Secondary | ICD-10-CM | POA: Diagnosis not present

## 2022-06-29 DIAGNOSIS — K219 Gastro-esophageal reflux disease without esophagitis: Secondary | ICD-10-CM | POA: Diagnosis not present

## 2022-06-29 DIAGNOSIS — I129 Hypertensive chronic kidney disease with stage 1 through stage 4 chronic kidney disease, or unspecified chronic kidney disease: Secondary | ICD-10-CM | POA: Diagnosis not present

## 2022-06-29 DIAGNOSIS — D649 Anemia, unspecified: Secondary | ICD-10-CM | POA: Diagnosis not present

## 2022-06-29 DIAGNOSIS — I9589 Other hypotension: Secondary | ICD-10-CM | POA: Diagnosis not present

## 2022-06-29 DIAGNOSIS — Z885 Allergy status to narcotic agent status: Secondary | ICD-10-CM | POA: Diagnosis not present

## 2022-06-29 DIAGNOSIS — N390 Urinary tract infection, site not specified: Secondary | ICD-10-CM | POA: Diagnosis not present

## 2022-06-29 DIAGNOSIS — E785 Hyperlipidemia, unspecified: Secondary | ICD-10-CM | POA: Diagnosis not present

## 2022-06-29 DIAGNOSIS — E669 Obesity, unspecified: Secondary | ICD-10-CM | POA: Diagnosis present

## 2022-06-29 DIAGNOSIS — Z6832 Body mass index (BMI) 32.0-32.9, adult: Secondary | ICD-10-CM | POA: Diagnosis not present

## 2022-06-29 DIAGNOSIS — F32A Depression, unspecified: Secondary | ICD-10-CM | POA: Diagnosis present

## 2022-06-29 DIAGNOSIS — Z882 Allergy status to sulfonamides status: Secondary | ICD-10-CM | POA: Diagnosis not present

## 2022-06-29 DIAGNOSIS — N179 Acute kidney failure, unspecified: Secondary | ICD-10-CM | POA: Diagnosis present

## 2022-06-29 DIAGNOSIS — N184 Chronic kidney disease, stage 4 (severe): Secondary | ICD-10-CM | POA: Diagnosis not present

## 2022-06-29 DIAGNOSIS — E860 Dehydration: Secondary | ICD-10-CM | POA: Diagnosis not present

## 2022-06-29 DIAGNOSIS — I952 Hypotension due to drugs: Secondary | ICD-10-CM

## 2022-06-29 DIAGNOSIS — N289 Disorder of kidney and ureter, unspecified: Secondary | ICD-10-CM | POA: Diagnosis not present

## 2022-06-29 DIAGNOSIS — Z8249 Family history of ischemic heart disease and other diseases of the circulatory system: Secondary | ICD-10-CM

## 2022-06-29 DIAGNOSIS — E861 Hypovolemia: Secondary | ICD-10-CM | POA: Diagnosis not present

## 2022-06-29 DIAGNOSIS — I959 Hypotension, unspecified: Secondary | ICD-10-CM

## 2022-06-29 DIAGNOSIS — Z932 Ileostomy status: Secondary | ICD-10-CM

## 2022-06-29 DIAGNOSIS — A419 Sepsis, unspecified organism: Secondary | ICD-10-CM

## 2022-06-29 DIAGNOSIS — N189 Chronic kidney disease, unspecified: Secondary | ICD-10-CM | POA: Diagnosis not present

## 2022-06-29 DIAGNOSIS — Z79899 Other long term (current) drug therapy: Secondary | ICD-10-CM

## 2022-06-29 DIAGNOSIS — E876 Hypokalemia: Secondary | ICD-10-CM | POA: Diagnosis present

## 2022-06-29 DIAGNOSIS — J45909 Unspecified asthma, uncomplicated: Secondary | ICD-10-CM | POA: Diagnosis present

## 2022-06-29 DIAGNOSIS — Z9049 Acquired absence of other specified parts of digestive tract: Secondary | ICD-10-CM

## 2022-06-29 DIAGNOSIS — R109 Unspecified abdominal pain: Secondary | ICD-10-CM | POA: Diagnosis not present

## 2022-06-29 DIAGNOSIS — R079 Chest pain, unspecified: Secondary | ICD-10-CM | POA: Diagnosis not present

## 2022-06-29 HISTORY — DX: Sepsis, unspecified organism: A41.9

## 2022-06-29 HISTORY — DX: Hypotension, unspecified: I95.9

## 2022-06-29 HISTORY — DX: Sepsis, unspecified organism: N39.0

## 2022-06-29 LAB — CBC WITH DIFFERENTIAL/PLATELET
Abs Immature Granulocytes: 0.14 10*3/uL — ABNORMAL HIGH (ref 0.00–0.07)
Basophils Absolute: 0.1 10*3/uL (ref 0.0–0.1)
Basophils Relative: 0 %
Eosinophils Absolute: 1.3 10*3/uL — ABNORMAL HIGH (ref 0.0–0.5)
Eosinophils Relative: 6 %
HCT: 39.1 % (ref 36.0–46.0)
Hemoglobin: 12.8 g/dL (ref 12.0–15.0)
Immature Granulocytes: 1 %
Lymphocytes Relative: 14 %
Lymphs Abs: 3 10*3/uL (ref 0.7–4.0)
MCH: 33.8 pg (ref 26.0–34.0)
MCHC: 32.7 g/dL (ref 30.0–36.0)
MCV: 103.2 fL — ABNORMAL HIGH (ref 80.0–100.0)
Monocytes Absolute: 1.2 10*3/uL — ABNORMAL HIGH (ref 0.1–1.0)
Monocytes Relative: 6 %
Neutro Abs: 15.3 10*3/uL — ABNORMAL HIGH (ref 1.7–7.7)
Neutrophils Relative %: 73 %
Platelets: 424 10*3/uL — ABNORMAL HIGH (ref 150–400)
RBC: 3.79 MIL/uL — ABNORMAL LOW (ref 3.87–5.11)
RDW: 16.2 % — ABNORMAL HIGH (ref 11.5–15.5)
WBC: 20.9 10*3/uL — ABNORMAL HIGH (ref 4.0–10.5)
nRBC: 0.1 % (ref 0.0–0.2)

## 2022-06-29 LAB — COMPREHENSIVE METABOLIC PANEL
ALT: 31 U/L (ref 0–44)
AST: 28 U/L (ref 15–41)
Albumin: 4.2 g/dL (ref 3.5–5.0)
Alkaline Phosphatase: 105 U/L (ref 38–126)
Anion gap: 14 (ref 5–15)
BUN: 69 mg/dL — ABNORMAL HIGH (ref 8–23)
CO2: 14 mmol/L — ABNORMAL LOW (ref 22–32)
Calcium: 10.4 mg/dL — ABNORMAL HIGH (ref 8.9–10.3)
Chloride: 105 mmol/L (ref 98–111)
Creatinine, Ser: 5.32 mg/dL — ABNORMAL HIGH (ref 0.44–1.00)
GFR, Estimated: 8 mL/min — ABNORMAL LOW (ref >60)
Glucose, Bld: 128 mg/dL — ABNORMAL HIGH (ref 70–99)
Potassium: 4.8 mmol/L (ref 3.5–5.1)
Sodium: 133 mmol/L — ABNORMAL LOW (ref 135–145)
Total Bilirubin: 0.6 mg/dL (ref 0.3–1.2)
Total Protein: 9.3 g/dL — ABNORMAL HIGH (ref 6.5–8.1)

## 2022-06-29 LAB — URINALYSIS, ROUTINE W REFLEX MICROSCOPIC
Bilirubin Urine: NEGATIVE
Glucose, UA: NEGATIVE mg/dL
Ketones, ur: NEGATIVE mg/dL
Nitrite: NEGATIVE
Protein, ur: 100 mg/dL — AB
Specific Gravity, Urine: <=1.005 (ref 1.005–1.030)
pH: 5 (ref 5.0–8.0)

## 2022-06-29 LAB — URINALYSIS, MICROSCOPIC (REFLEX)

## 2022-06-29 LAB — LIPASE, BLOOD: Lipase: 76 U/L — ABNORMAL HIGH (ref 11–51)

## 2022-06-29 LAB — LACTIC ACID, PLASMA
Lactic Acid, Venous: 1.6 mmol/L (ref 0.5–1.9)
Lactic Acid, Venous: 2 mmol/L (ref 0.5–1.9)

## 2022-06-29 MED ORDER — SODIUM CHLORIDE 0.9 % IV BOLUS
1000.0000 mL | Freq: Once | INTRAVENOUS | Status: AC
  Administered 2022-06-29: 1000 mL via INTRAVENOUS

## 2022-06-29 MED ORDER — SODIUM CHLORIDE 0.9 % IV SOLN
1.0000 g | INTRAVENOUS | Status: DC
  Administered 2022-06-30: 1 g via INTRAVENOUS
  Filled 2022-06-29: qty 10

## 2022-06-29 MED ORDER — HEPARIN SODIUM (PORCINE) 5000 UNIT/ML IJ SOLN
5000.0000 [IU] | Freq: Three times a day (TID) | INTRAMUSCULAR | Status: DC
  Administered 2022-06-30 – 2022-07-01 (×5): 5000 [IU] via SUBCUTANEOUS
  Filled 2022-06-29 (×6): qty 1

## 2022-06-29 MED ORDER — SODIUM CHLORIDE 0.9 % IV SOLN
1.0000 g | Freq: Once | INTRAVENOUS | Status: AC
  Administered 2022-06-29: 1 g via INTRAVENOUS
  Filled 2022-06-29: qty 10

## 2022-06-29 MED ORDER — LACTATED RINGERS IV SOLN: INTRAVENOUS | Status: DC

## 2022-06-29 NOTE — ED Notes (Signed)
Frozen meal given with water

## 2022-06-29 NOTE — ED Notes (Signed)
Care handoff given to [name] @ [facility]

## 2022-06-29 NOTE — Plan of Care (Signed)
Plan of Care Note for accepted transfer   Patient: [name] MRN: [id number]   DOA: 06/29/2022  Facility requesting transfer: [facility] Requesting Provider: Dr. Gilford Raid Reason for transfer: possible need for nephrology/HD Facility course:  66 yo female with PMH CKD3/4, UC (s/p total colectomy 1998) now with diverting ileostomy (04/23/21), recurrent IDA who had syncopal event at cancer center for chronic anemia and was referred to the ER. She was found to be tachycardic (110s) and hypotensive (72/53).  UA noted with neg nitrite, small LE, 21-50 WBC, few bacteria. WBC 20.9 BUN 69, creat 5.32 (up from baseline around 2.6-3).  Likely pre-renal vs possible ATN if prolonged pre-renal and/or from hypotension. Given concern for fragile renal function if no improvement, patient accepted to [facility] campus instead of [facility] in case of need for HD.  Rocephin given for presumed UTI treatment. IVF given for hypotension.   Plan of care: The patient is accepted for admission to Telemetry unit, at [facility].  Author: Dwyane Dee, MD 06/29/2022  Check [email or web address] for on-call coverage.  Nursing staff, Please call [place] number on Amion as soon as patient's arrival, so appropriate admitting provider can evaluate the pt.

## 2022-06-29 NOTE — H&P (Signed)
History and Physical    Patient: [name] QAS:[id number] DOB: 06/29/1956 DOA: 06/29/2022 DOS: the patient was seen and examined on 06/30/2022 PCP: Ann Held, DO  Patient coming from:  [place] [facility]  Chief Complaint:  Chief Complaint  Patient presents with   Abdominal Pain   HPI: [name] is a 66 y.o. female with medical history significant of CKD3/4, UC (s/p total colectomy 1998) now with diverting ileostomy (04/23/21), recurrent IDA who had syncopal event at cancer center for follow up of chronic anemia and was referred to the ER.   Pt felt she was already starting to get dehydrated about 3-4 days ago. Tried to go to her hematology appointment today to ask about IV fluids but felt dizzy and almost loss consciousness in the elevator going to office. Had more output in her ileostomy and was more bilious in color. She had lower abdominal pain but has been ongoing since a fall a few months ago and thinks she pulled a muscle. Has more belching but no vomiting. Denies NSAIDS and knows she is suppose to just take Tylenol due to her CKD. She is on metoprolol for hypertension but missed it the past 2 days since she was busy caring for her elderly mother. Denies any fever or chills. No dysuria but has noted more discharge on her underwear. Unclear if its coming vaginally. Denies pruritus.    In the ED, afebrile, HR 110s and hypotensive TO 70s/50s UA noted with neg nitrite, small LE, 21-50 WBC, few bacteria. WBC 20.9, lactic of 2. Hgb of 12.8.  BUN 69, creat 5.32 (up from baseline around 2.6-3). Gap of 14.   She was given 2L of IV fluid and was fluid responsive. Also started on IV Rocephin.  Review of Systems: As mentioned in the history of present illness. All other systems reviewed and are negative. Past Medical History:  Diagnosis Date   Abdominal pain    Hx   Allergy    Anal stenosis    Anemia    Anxiety    Arthritis    Asthma    patient does not have inhaler    Blood in stool    Hx   Blood in urine    Hx   Blood transfusion without reported diagnosis    Cataract    CKD (chronic kidney disease) stage 3, GFR 30-59 ml/min (HCC)    Crohn's colitis, other complication (HCC)    De Quervain's tenosynovitis    Depression    Difficulty urinating    Hx   Easy bruising    Esophagitis    Fainting    History - resolved - due to dehydration   Fatigue    Hx   Fibroid    Hx   Gastric polyp    GERD (gastroesophageal reflux disease)    Hearing loss    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   Hemorrhoids, external    Hemorrhoids, internal    Herpes, genital    vaginal treated 07/05/12 and pt states is resolved   History of cervical dysplasia    History of small bowel obstruction    Hyperlipidemia    currently no meds   Hyperparathyroidism    Hypertension    Hypokalemia    Hx   IBD (inflammatory bowel disease)    initially colectomy for suspected UC, 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   Obesity    Ovarian cyst  Poor dental hygiene    Pulmonary nodule, right    right upper lobe   Rectal bleeding    Hx   Rectal pain    Hx   Renal insufficiency    CKD - stage 3   RLS (restless legs syndrome)    no meds   Tooth infection 11/2016   right low   Ulcerative colitis    Visual disturbance    wears glasses   Weakness generalized    Hx - patient denies generalized weakness   Wears dentures    upper only   Past Surgical History:  Procedure Laterality Date   ANAL DILATION     CERVICAL BIOPSY  W/ LOOP ELECTRODE EXCISION     CHOLECYSTECTOMY     COLONOSCOPY     [name]   fatty tumor removed from back     X 2   HEMORRHOID SURGERY     ILEOSTOMY CLOSURE     RESTORATIVE PROCTOCOLECTOMY     with insertion of ileoanal J Pouch with loop ileostomy   SHOULDER ARTHROSCOPY WITH CAPSULORRHAPHY Left 06/14/2019   Procedure: LEFT SHOULDER ARTHRSCOPIC REPAIR OF BONY BANKART FRACTURE;  Surgeon: Tania Ade, MD;  Location: [facility];  Service: Orthopedics;  Laterality: Left;  REQUEST 90 MINUTE   SIGMOIDOSCOPY     TOTAL ABDOMINAL HYSTERECTOMY  1998   TAH/LSO   TUBAL LIGATION     UPPER GASTROINTESTINAL ENDOSCOPY     [name]   Social History:  reports that she quit smoking about 43 years ago. Her smoking use included cigarettes. She has a 4.00 pack-year smoking history. She has never used smokeless tobacco. She reports that she does not drink alcohol and does not use drugs.  Allergies  Allergen Reactions   Sulfa Antibiotics Hives, Itching, Rash and Swelling   Morphine Other (See Comments)    "Gives me crazy dreams" (delusions, also)   Sulfonamide Derivatives Hives, Itching and Swelling    Family History  Problem Relation Age of Onset   Ulcerative colitis Father    Hypertension Father    Heart attack Father    Hypertension Mother    Heart disease Mother        s/p pci   Ulcerative colitis Daughter    Irritable bowel syndrome Other        grandchildren   Diabetes Sister    Cancer Sister        uterine   Cancer Maternal Uncle        LUNG   Colon cancer Neg Hx    Esophageal cancer Neg Hx    Stomach cancer Neg Hx    Rectal cancer Neg Hx     Prior to Admission medications   Medication Sig Start Date End Date Taking? Authorizing Provider  acetaminophen (TYLENOL) 500 MG tablet Take 500-1,000 mg by mouth at bedtime.    [provider]  allopurinol (ZYLOPRIM) 300 MG tablet Take 1 tablet (300 mg total) by mouth daily. 12/15/21   Ann Held, DO  Calcium Carbonate Antacid (TUMS EXTRA STRENGTH 750 PO) Take 1-2 tablets by mouth See admin instructions. Chew 1-2 tablets by mouth as needed for heartburn or reflux    [provider]  DULoxetine (CYMBALTA) 60 MG capsule TAKE 2 CAPSULES BY MOUTH EVERY DAY 12/23/21   Carollee Herter, Alferd Apa, DO  famotidine (PEPCID) 20 MG tablet TAKE 1 TABLET BY MOUTH TWICE A DAY Patient taking differently: Take 20 mg by mouth daily as needed for heartburn or  indigestion. 04/01/21   Armbruster, Carlota Raspberry, MD  folic acid (FOLVITE) 1 MG tablet Take 2 tablets (2 mg total) by mouth daily. Patient taking differently: Take 1-2 mg by mouth daily. 05/26/17   [name], MD  metoprolol succinate (TOPROL-XL) 100 MG 24 hr tablet Take 50-100 mg by mouth See admin instructions. Take 100 mg by mouth in the morning and 50 mg at bedtime    [provider]  Vitamin D, Ergocalciferol, (DRISDOL) 1.25 MG (50000 UNIT) CAPS capsule Take 50,000 Units by mouth every 30 (thirty) days.    [provider]    Physical Exam: Vitals:   06/29/22 1500 06/29/22 1700 06/29/22 2035 06/29/22 2203  BP: 117/75 134/82 (!) 145/122 (!) 132/99  Pulse: (!) 103 (!) 109 (!) 121 (!) 112  Resp: (!) 24 17 18 18   Temp:  98 F (36.7 C) 98 F (36.7 C) 98.4 F (36.9 C)  TempSrc:  Oral Oral Oral  SpO2: 94% 100% 100% 99%  Weight:    83.8 kg  Height:    5' 3"  (1.6 m)   Constitutional: NAD, calm, comfortable, pale chronically ill appearing female laying flat in bed Eyes: lids and conjunctivae normal ENMT: Mucous membranes are moist.   Neck: normal, supple Respiratory: clear to auscultation bilaterally, no wheezing, no crackles. Normal respiratory effort. No accessory muscle use.  Cardiovascular: Regular rate and rhythm, no murmurs / rubs / gallops. No extremity edema.   Abdomen: soft, non-distended, no tenderness, Bowel sounds positive to right quadrants only. Ileostomy in place with dark greenish colored liquid stool Musculoskeletal: no clubbing / cyanosis. No joint deformity upper and lower extremities. Good ROM, no contractures. Normal muscle tone.  Skin: no rashes, lesions, ulcers.  Neurologic: CN 2-12 grossly intact.  Strength 5/5 in all 4.  Psychiatric: Normal judgment and insight. Alert and oriented x 3. Normal mood. Data Reviewed:  See HPI  Assessment and Plan: * Acute renal failure superimposed on stage 4 chronic kidney disease, unspecified acute renal  failure type (HCC) creatinine 5.32 (up from baseline around 2.6-3). Likely pre-renal since she has noted increase ileostomy output -continuous IV LR fluid  -monitor intake and output -follow daily creatinine -avoid nephrotoxic agent or contrasted study  Ileostomy in place [facility]) Hx of UC s/p total colectomy (1998) and now diverting ileostomy (04/23/21)  -continue ileostomy care daily  Sepsis secondary to UTI [facility]) -Presented with tachycardia, worsening leukocytosis with suspect UTI -continue IV Rocephin -continue IV fluid  Hypotension Secondary to dehydration/increase ileostomy output -BP normalized with fluid resuscitation -continue to monitor on continuous IV fluid  -hold home metoprolol      Advance Care Planning:   Code Status: Full Code   Consults: none  Family Communication: none at bedside  Severity of Illness: The appropriate patient status for this patient is INPATIENT. Inpatient status is judged to be reasonable and necessary in order to provide the required intensity of service to ensure the patient's safety. The patient's presenting symptoms, physical exam findings, and initial radiographic and laboratory data in the context of their chronic comorbidities is felt to place them at high risk for further clinical deterioration. Furthermore, it is not anticipated that the patient will be medically stable for discharge from the hospital within 2 midnights of admission.   * I certify that at the point of admission it is my clinical judgment that the patient will require inpatient hospital care spanning beyond 2 midnights from the point of admission due to high intensity of service, high  risk for further deterioration and high frequency of surveillance required.*  Author: Orene Desanctis, DO 06/30/2022 12:07 AM  For on call review [email or web address].

## 2022-06-29 NOTE — ED Notes (Signed)
Bed linens changed  Purewick not in place

## 2022-06-29 NOTE — ED Notes (Signed)
Care handoff given to [facility]

## 2022-06-29 NOTE — ED Provider Notes (Signed)
[facility] EMERGENCY DEPARTMENT Provider Note   CSN: [id number] Arrival date & time: 06/29/22  0998     History  Chief Complaint  Patient presents with   Abdominal Pain    [name] is a 66 y.o. female.  Pt is a 66 yo female with a pmhx significant for sbo, GERD, depression, anxiety, UC s/p ileostomy, RLS, fibroids, chronic anemia (from hx UC and CKD), and ckd.  Pt has had abd pain since April.  She thought it would get better, but it has not.  She had an appointment upstairs in the cancer center to follow her chronic anemia.  When she arrived she nearly passed out.  BP was checked and it was in the 70s.  She was sent down here.  She denies any fever or new pain.       Home Medications Prior to Admission medications   Medication Sig Start Date End Date Taking? Authorizing Provider  acetaminophen (TYLENOL) 500 MG tablet Take 500-1,000 mg by mouth at bedtime.    [provider]  allopurinol (ZYLOPRIM) 300 MG tablet Take 1 tablet (300 mg total) by mouth daily. 12/15/21   Ann Held, DO  Calcium Carbonate Antacid (TUMS EXTRA STRENGTH 750 PO) Take 1-2 tablets by mouth See admin instructions. Chew 1-2 tablets by mouth as needed for heartburn or reflux    [provider]  DULoxetine (CYMBALTA) 60 MG capsule TAKE 2 CAPSULES BY MOUTH EVERY DAY 12/23/21   Carollee Herter, Alferd Apa, DO  famotidine (PEPCID) 20 MG tablet TAKE 1 TABLET BY MOUTH TWICE A DAY Patient taking differently: Take 20 mg by mouth daily as needed for heartburn or indigestion. 04/01/21   Armbruster, Carlota Raspberry, MD  folic acid (FOLVITE) 1 MG tablet Take 2 tablets (2 mg total) by mouth daily. Patient taking differently: Take 1-2 mg by mouth daily. 05/26/17   [name], MD  metoprolol succinate (TOPROL-XL) 100 MG 24 hr tablet Take 50-100 mg by mouth See admin instructions. Take 100 mg by mouth in the morning and 50 mg at bedtime    [provider]  Vitamin D, Ergocalciferol,  (DRISDOL) 1.25 MG (50000 UNIT) CAPS capsule Take 50,000 Units by mouth every 30 (thirty) days.    [provider]      Allergies    Sulfa antibiotics, Morphine, and Sulfonamide derivatives    Review of Systems   Review of Systems  Gastrointestinal:  Positive for abdominal pain.  All other systems reviewed and are negative.   Physical Exam Updated Vital Signs BP 117/75   Pulse (!) 103   Temp 97.7 F (36.5 C) (Axillary)   Resp (!) 24   SpO2 94%  Physical Exam Vitals and nursing note reviewed.  Constitutional:      Appearance: She is well-developed.  HENT:     Head: Normocephalic and atraumatic.     Mouth/Throat:     Mouth: Mucous membranes are dry.     Pharynx: Oropharynx is clear.  Eyes:     Extraocular Movements: Extraocular movements intact.     Pupils: Pupils are equal, round, and reactive to light.  Cardiovascular:     Rate and Rhythm: Regular rhythm. Tachycardia present.  Pulmonary:     Effort: Pulmonary effort is normal.     Breath sounds: Normal breath sounds.  Abdominal:     General: Abdomen is flat. Bowel sounds are normal.     Palpations: Abdomen is soft.     Tenderness:  There is abdominal tenderness in the left lower quadrant.     Comments: + ileostomy in place  Skin:    General: Skin is warm.     Capillary Refill: Capillary refill takes less than 2 seconds.  Neurological:     General: No focal deficit present.     Mental Status: She is alert and oriented to person, place, and time.  Psychiatric:        Mood and Affect: Mood normal.        Behavior: Behavior normal.     ED Results / Procedures / Treatments   Labs (all labs ordered are listed, but only abnormal results are displayed) Labs Reviewed  CBC WITH DIFFERENTIAL/PLATELET - Abnormal; Notable for the following components:      Result Value   WBC 20.9 (*)    RBC 3.79 (*)    MCV 103.2 (*)    RDW 16.2 (*)    Platelets 424 (*)    Neutro Abs 15.3 (*)    Monocytes Absolute 1.2 (*)     Eosinophils Absolute 1.3 (*)    Abs Immature Granulocytes 0.14 (*)    All other components within normal limits  COMPREHENSIVE METABOLIC PANEL - Abnormal; Notable for the following components:   Sodium 133 (*)    CO2 14 (*)    Glucose, Bld 128 (*)    BUN 69 (*)    Creatinine, Ser 5.32 (*)    Calcium 10.4 (*)    Total Protein 9.3 (*)    GFR, Estimated 8 (*)    All other components within normal limits  LACTIC ACID, PLASMA - Abnormal; Notable for the following components:   Lactic Acid, Venous 2.0 (*)    All other components within normal limits  LIPASE, BLOOD - Abnormal; Notable for the following components:   Lipase 76 (*)    All other components within normal limits  URINALYSIS, ROUTINE W REFLEX MICROSCOPIC - Abnormal; Notable for the following components:   Hgb urine dipstick SMALL (*)    Protein, ur 100 (*)    Leukocytes,Ua SMALL (*)    All other components within normal limits  URINALYSIS, MICROSCOPIC (REFLEX) - Abnormal; Notable for the following components:   Bacteria, UA FEW (*)    All other components within normal limits  CULTURE, BLOOD (ROUTINE X 2)  CULTURE, BLOOD (ROUTINE X 2)  URINE CULTURE  LACTIC ACID, PLASMA    EKG None  Radiology CT ABDOMEN PELVIS WO CONTRAST  Result Date: 06/29/2022 CLINICAL DATA:  Acute left upper quadrant abdominal pain. EXAM: CT ABDOMEN AND PELVIS WITHOUT CONTRAST TECHNIQUE: Multidetector CT imaging of the abdomen and pelvis was performed following the standard protocol without IV contrast. RADIATION DOSE REDUCTION: This exam was performed according to the departmental dose-optimization program which includes automated exposure control, adjustment of the mA and/or kV according to patient size and/or use of iterative reconstruction technique. COMPARISON:  September 22, 2021. FINDINGS: Lower chest: No acute abnormality. Hepatobiliary: No focal liver abnormality is seen. Status post cholecystectomy. No biliary dilatation. Pancreas:  Unremarkable. No pancreatic ductal dilatation or surrounding inflammatory changes. Spleen: Normal in size without focal abnormality. Adrenals/Urinary Tract: Adrenal glands are unremarkable. Kidneys are normal, without renal calculi, focal lesion, or hydronephrosis. Bladder is unremarkable. Stomach/Bowel: The stomach appears normal. Status post colectomy with ileostomy seen in right lower quadrant. No abnormal bowel dilatation or inflammation is noted. Vascular/Lymphatic: No significant vascular findings are present. No enlarged abdominal or pelvic lymph nodes. Reproductive: Status post hysterectomy. No adnexal  masses. Other: No significant hernia or ascites is noted. Musculoskeletal: No acute or significant osseous findings. IMPRESSION: Status post colectomy with ileostomy seen in right lower quadrant. No abnormal bowel dilatation or inflammation is noted. No acute abnormality seen in the abdomen or pelvis. Electronically Signed   By: Marijo Conception M.D.   On: 06/29/2022 12:24   DG Chest Portable 1 View  Result Date: 06/29/2022 CLINICAL DATA:  Hypotension. Chronic left upper quadrant abdominal pain. EXAM: PORTABLE CHEST 1 VIEW COMPARISON:  Chest x-ray dated September 22, 2021. FINDINGS: The heart size and mediastinal contours are within normal limits. Both lungs are clear. The visualized skeletal structures are unremarkable. IMPRESSION: No active disease. Electronically Signed   By: Titus Dubin M.D.   On: 06/29/2022 11:09    Procedures Procedures    Medications Ordered in ED Medications  sodium chloride 0.9 % bolus 1,000 mL (0 mLs Intravenous Stopped 06/29/22 1142)  sodium chloride 0.9 % bolus 1,000 mL (0 mLs Intravenous Stopped 06/29/22 1344)  cefTRIAXone (ROCEPHIN) 1 g in sodium chloride 0.9 % 100 mL IVPB (0 g Intravenous Stopped 06/29/22 1500)    ED Course/ Medical Decision Making/ A&P                           Medical Decision Making Amount and/or Complexity of Data Reviewed Labs:  ordered. Radiology: ordered.  Risk Decision regarding hospitalization.   This patient presents to the ED for concern of abd pain, this involves an extensive number of treatment options, and is a complaint that carries with it a high risk of complications and morbidity.  The differential diagnosis includes ulcerative colitis complication, infection, uti, sepsis, diverticulitis   Co morbidities that complicate the patient evaluation  sbo, GERD, depression, anxiety, UC s/p ileostomy, RLS, fibroids, chronic anemia (from hx UC and CKD), and ckd   Additional history obtained:  Additional history obtained from epic chart review   Lab Tests:  I Ordered, and personally interpreted labs.  The pertinent results include:  cbc with wbc elevated at 20.9; hgb 12.8; cmp with CO2 14, bun 69, and Cr 5.32 (bun 39 and 3.22 on 9/5); lipase 76; lactic 1.6; ua with small LE, 21-50 wbcs   Imaging Studies ordered:  I ordered imaging studies including cxr and CT abd/pelvis I independently visualized and interpreted imaging which showed   CXR:  IMPRESSION:  No active disease.   CT abd/pelvis:  IMPRESSION:  Status post colectomy with ileostomy seen in right lower quadrant.  No abnormal bowel dilatation or inflammation is noted.    No acute abnormality seen in the abdomen or pelvis.   I agree with the radiologist interpretation   Cardiac Monitoring:  The patient was maintained on a cardiac monitor.  I personally viewed and interpreted the cardiac monitored which showed an underlying rhythm of: nsr   Medicines ordered and prescription drug management:  I ordered medication including IVFs  for hypotension  Reevaluation of the patient after these medicines showed that the patient improved I have reviewed the patients home medicines and have made adjustments as needed   Test Considered:  ct   Critical Interventions:  ivfs   Consultations Obtained:  I requested consultation with  the hospitalists (Dr. Tenny Craw),  and discussed lab and imaging findings as well as pertinent plan - he will admit.   Problem List / ED Course:  Hypotension:  improved with IVFs Acute on chronic kidney failure:  likely due to dehydration.  IVFs given. ? Sepsis.  Lactic nl.  Ua with possible uti.    Reevaluation:  After the interventions noted above, I reevaluated the patient and found that they have :improved   Social Determinants of Health:  Lives at home   Dispostion:  After consideration of the diagnostic results and the patients response to treatment, I feel that the patent would benefit from admission.          Final Clinical Impression(s) / ED Diagnoses Final diagnoses:  Hypotension due to hypovolemia  Acute on chronic renal insufficiency    Rx / DC Orders ED Discharge Orders     None         Isla Pence, MD 06/29/22 1551

## 2022-06-29 NOTE — ED Triage Notes (Signed)
C/O abdominal pain on left upper side of abdominal area; stated started back in April and she thought it wound improve on its own. Stated had surgery in Jan for j pouch removal and ileostomy. Stated she has kidney  problems as well.

## 2022-06-29 NOTE — Telephone Encounter (Signed)
Patient came to our office this morning reporting that she did not feel good. Pt was advised by her PCP to come to our office for evaluation. Pt c/o of lower abdominal pain and her vital signs were not stable. Per Nelwyn Salisbury- pt needs to be seen in the ER. Pt was taken to the ER downstairs. Vital signs included: 84/62 BP, 98% O2, 112 heart rate, 18 respirations.

## 2022-06-30 DIAGNOSIS — Z932 Ileostomy status: Secondary | ICD-10-CM

## 2022-06-30 DIAGNOSIS — N189 Chronic kidney disease, unspecified: Secondary | ICD-10-CM

## 2022-06-30 DIAGNOSIS — A419 Sepsis, unspecified organism: Secondary | ICD-10-CM | POA: Diagnosis not present

## 2022-06-30 DIAGNOSIS — N289 Disorder of kidney and ureter, unspecified: Secondary | ICD-10-CM

## 2022-06-30 DIAGNOSIS — N179 Acute kidney failure, unspecified: Secondary | ICD-10-CM | POA: Diagnosis not present

## 2022-06-30 LAB — URINE CULTURE: Culture: NO GROWTH

## 2022-06-30 LAB — BASIC METABOLIC PANEL
Anion gap: 11 (ref 5–15)
BUN: 63 mg/dL — ABNORMAL HIGH (ref 8–23)
CO2: 13 mmol/L — ABNORMAL LOW (ref 22–32)
Calcium: 9.2 mg/dL (ref 8.9–10.3)
Chloride: 112 mmol/L — ABNORMAL HIGH (ref 98–111)
Creatinine, Ser: 4.56 mg/dL — ABNORMAL HIGH (ref 0.44–1.00)
GFR, Estimated: 10 mL/min — ABNORMAL LOW (ref >60)
Glucose, Bld: 102 mg/dL — ABNORMAL HIGH (ref 70–99)
Potassium: 4.4 mmol/L (ref 3.5–5.1)
Sodium: 136 mmol/L (ref 135–145)

## 2022-06-30 LAB — CBC
HCT: 32.3 % — ABNORMAL LOW (ref 36.0–46.0)
Hemoglobin: 10.5 g/dL — ABNORMAL LOW (ref 12.0–15.0)
MCH: 34 pg (ref 26.0–34.0)
MCHC: 32.5 g/dL (ref 30.0–36.0)
MCV: 104.5 fL — ABNORMAL HIGH (ref 80.0–100.0)
Platelets: 328 10*3/uL (ref 150–400)
RBC: 3.09 MIL/uL — ABNORMAL LOW (ref 3.87–5.11)
RDW: 15.9 % — ABNORMAL HIGH (ref 11.5–15.5)
WBC: 16.1 10*3/uL — ABNORMAL HIGH (ref 4.0–10.5)
nRBC: 0 % (ref 0.0–0.2)

## 2022-06-30 LAB — LACTIC ACID, PLASMA: Lactic Acid, Venous: 0.9 mmol/L (ref 0.5–1.9)

## 2022-06-30 MED ORDER — AMLODIPINE BESYLATE 5 MG PO TABS
5.0000 mg | ORAL_TABLET | Freq: Every day | ORAL | Status: DC
  Administered 2022-06-30 – 2022-07-01 (×2): 5 mg via ORAL
  Filled 2022-06-30 (×2): qty 1

## 2022-06-30 MED ORDER — METOPROLOL TARTRATE 25 MG PO TABS
25.0000 mg | ORAL_TABLET | Freq: Two times a day (BID) | ORAL | Status: DC
  Administered 2022-06-30 – 2022-07-01 (×3): 25 mg via ORAL
  Filled 2022-06-30 (×3): qty 1

## 2022-06-30 MED ORDER — LACTATED RINGERS IV SOLN: INTRAVENOUS | Status: DC

## 2022-06-30 NOTE — Assessment & Plan Note (Addendum)
Secondary to dehydration/increase ileostomy output -BP normalized with fluid resuscitation -continue to monitor on continuous IV fluid  -hold home metoprolol

## 2022-06-30 NOTE — Progress Notes (Signed)
MD, patient is requesting to re-start her home medications especially her Metoprolol. Thank you.

## 2022-06-30 NOTE — Progress Notes (Signed)
[facility]  PROGRESS NOTE  [name] OEH:[id number] DOB: [birth year] DOA: 06/29/2022 PCP: Jeanette Held, DO   Brief HPI:    66 y.o. female with medical history significant of CKD3/4, UC (s/p total colectomy 1998) now with diverting ileostomy (04/23/21), recurrent IDA who had syncopal event at cancer center for follow up of chronic anemia and was referred to the ER.   Pt felt she was already starting to get dehydrated about 3-4 days ago. Tried to go to her hematology appointment today to ask about IV fluids but felt dizzy and almost loss consciousness in the elevator going to office. Had more output in her ileostomy and was more bilious in color.   Subjective   Patient seen and examined, denies any pain.  Feels better this morning.   Assessment/Plan:    Acute kidney injury on CKD stage IV -Presented with creatinine of 5.32, up from baseline 2.6-3 -Let prerenal from increased output from ileostomy -Continue LR at 100 ml/h  Ileostomy in place -Patient history of.  Colitis s/p total colectomy in 1998 and now diverting ileostomy from 04/23/2021 -Continue ileostomy care  SIRS -Presented with sinus tachycardia, leukocytosis and abnormal UA -Started on IV Rocephin -Urine culture showed no growth -Still has elevated WBC -CT abdomen/pelvis did not show any significant abnormality -We will discontinue IV antibiotics; patient is afebrile  Hypertension -We will restart beta-blocker at low-dose metoprolol 25 mg p.o. twice daily -Will reorder amlodipine 5 mg daily    Medications     heparin  5,000 Units Subcutaneous Q8H   metoprolol tartrate  25 mg Oral BID     Data Reviewed:   CBG:  No results for input(s): "GLUCAP" in the last 168 hours.  SpO2: 97 %    Vitals:   06/29/22 2035 06/29/22 2203 06/30/22 0727 06/30/22 0735  BP: (!) 145/122 (!) 132/99  (!) 143/81  Pulse: (!) 121 (!) 112  90  Resp: 18 18 18 18   Temp: 98 F (36.7 C) 98.4 F (36.9 C)  98.4  F (36.9 C)  TempSrc: Oral Oral    SpO2: 100% 99%  97%  Weight:  83.8 kg    Height:  5' 3"  (1.6 m)        Data Reviewed:  Basic Metabolic Panel: Recent Labs  Lab 06/29/22 1040 06/30/22 0403  NA 133* 136  K 4.8 4.4  CL 105 112*  CO2 14* 13*  GLUCOSE 128* 102*  BUN 69* 63*  CREATININE 5.32* 4.56*  CALCIUM 10.4* 9.2    CBC: Recent Labs  Lab 06/29/22 1040 06/30/22 0403  WBC 20.9* 16.1*  NEUTROABS 15.3*  --   HGB 12.8 10.5*  HCT 39.1 32.3*  MCV 103.2* 104.5*  PLT 424* 328    LFT Recent Labs  Lab 06/29/22 1040  AST 28  ALT 31  ALKPHOS 105  BILITOT 0.6  PROT 9.3*  ALBUMIN 4.2     Antibiotics: Anti-infectives (From admission, onward)    Start     Dose/Rate Route Frequency Ordered Stop   06/30/22 1000  cefTRIAXone (ROCEPHIN) 1 g in sodium chloride 0.9 % 100 mL IVPB        1 g 200 mL/hr over 30 Minutes Intravenous Every 24 hours 06/29/22 2357     06/29/22 1415  cefTRIAXone (ROCEPHIN) 1 g in sodium chloride 0.9 % 100 mL IVPB        1 g 200 mL/hr over 30 Minutes Intravenous  Once 06/29/22 1411 06/29/22 1500  DVT prophylaxis: Heparin  Code Status: Full code  Family Communication: No family at bedside   CONSULTS    Objective    Physical Examination:   General-appears in no acute distress Heart-S1-S2, regular, no murmur auscultated Lungs-clear to auscultation bilaterally, no wheezing or crackles auscultated Abdomen-soft, nontender, ileostomy in place, no organomegaly Extremities-no edema in the lower extremities Neuro-alert, oriented x3, no focal deficit noted  Status is: Inpatient:             [name]   [facility] If 7PM-7AM, please contact night-coverage at [email or web address], Office  [phone]   06/30/2022, 9:41 AM  LOS: 1 day

## 2022-06-30 NOTE — Assessment & Plan Note (Signed)
-  Presented with tachycardia, worsening leukocytosis with suspect UTI -continue IV Rocephin -continue IV fluid

## 2022-06-30 NOTE — Assessment & Plan Note (Signed)
Hx of UC s/p total colectomy (1998) and now diverting ileostomy (04/23/21)  -continue ileostomy care daily

## 2022-06-30 NOTE — Assessment & Plan Note (Signed)
creatinine 5.32 (up from baseline around 2.6-3). Likely pre-renal since she has noted increase ileostomy output -continuous IV LR fluid  -monitor intake and output -follow daily creatinine -avoid nephrotoxic agent or contrasted study

## 2022-06-30 NOTE — H&P (Incomplete)
History and Physical    Patient: [name] NLZ:[id number] DOB: 1956-04-21 DOA: 06/29/2022 DOS: the patient was seen and examined on 06/29/2022 PCP: Ann Held, DO  Patient coming from:  [facility]  Chief Complaint:  Chief Complaint  Patient presents with  . Abdominal Pain   HPI: [name] is a 66 y.o. female with medical history significant of CKD3/4, UC (s/p total colectomy 1998) now with diverting ileostomy (04/23/21), recurrent IDA who had syncopal event at cancer center for follow up of chronic anemia and was referred to the ER.   Pt felt she was already starting to get dehydrated about 3-4 days ago. Had more output in her ileostomy and was more bilious in color. She had lower abdominal pain but has been ongoing since a fall a few months ago and thinks she pulled a muscle. Has more belching but no vomiting. Denies NSAIDS and knows she is suppose to just take Tylenol due to her CKD. She is on metoprolol for hypertension but missed it the past 2 days since she was busy caring for her elderly mother. Denies any fever or chills. No dysuria but has noted more discharge on her underwear. Unclear if its coming vaginally. Denies pruritus.    In the ED, afebrile, HR 110s and hypotensive TO 70s/50s UA noted with neg nitrite, small LE, 21-50 WBC, few bacteria. WBC 20.9, lactic of 2. Hgb of 12.8.  BUN 69, creat 5.32 (up from baseline around 2.6-3). Gap of 14.   She was given 2L of IV fluid and was fluid responsive. Also started on IV Rocephin.  Review of Systems: As mentioned in the history of present illness. All other systems reviewed and are negative. Past Medical History:  Diagnosis Date  . Abdominal pain    Hx  . Allergy   . Anal stenosis   . Anemia   . Anxiety   . Arthritis   . Asthma    patient does not have inhaler  . Blood in stool    Hx  . Blood in urine    Hx  . Blood transfusion without reported diagnosis   . Cataract   . CKD (chronic kidney  disease) stage 3, GFR 30-59 ml/min (HCC)   . Crohn's colitis, other complication ([place])   . De Quervain's tenosynovitis   . Depression   . Difficulty urinating    Hx  . Easy bruising   . Esophagitis   . Fainting    History - resolved - due to dehydration  . Fatigue    Hx  . Fibroid    Hx  . Gastric polyp   . GERD (gastroesophageal reflux disease)   . 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 Hemorrhoids, external   . Hemorrhoids, internal   . Herpes, genital    vaginal treated 07/05/12 and pt states is resolved  . History of cervical dysplasia   . History of small bowel obstruction   . Hyperlipidemia    currently no meds  . Hyperparathyroidism   . Hypertension   . Hypokalemia    Hx  . 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 Obesity   . Ovarian cyst   . Poor dental hygiene   . Pulmonary nodule, right    right upper lobe  . Rectal bleeding    Hx  .  Rectal pain    Hx  . Renal insufficiency    CKD - stage 3  . RLS (restless legs syndrome)    no meds  . Tooth infection 11/2016   right low  . Ulcerative colitis   . Visual disturbance    wears glasses  . Weakness generalized    Hx - patient denies generalized weakness  . Wears dentures    upper only   Past Surgical History:  Procedure Laterality Date  . ANAL DILATION    . CERVICAL BIOPSY  W/ LOOP ELECTRODE EXCISION    . CHOLECYSTECTOMY    . COLONOSCOPY     [name]  . fatty tumor removed from back     X 2  . HEMORRHOID SURGERY    . ILEOSTOMY CLOSURE    . RESTORATIVE PROCTOCOLECTOMY     with insertion of ileoanal J Pouch with loop ileostomy  . SHOULDER ARTHROSCOPY WITH CAPSULORRHAPHY Left 06/14/2019   Procedure: LEFT SHOULDER ARTHRSCOPIC REPAIR OF BONY BANKART FRACTURE;  Surgeon: Tania Ade, MD;  Location: [facility];  Service: Orthopedics;  Laterality: Left;  REQUEST 90 MINUTE  . SIGMOIDOSCOPY    .  TOTAL ABDOMINAL HYSTERECTOMY  1998   TAH/LSO  . TUBAL LIGATION    . UPPER GASTROINTESTINAL ENDOSCOPY     [name]   Social History:  reports that she quit smoking about 43 years ago. Her smoking use included cigarettes. She has a 4.00 pack-year smoking history. She has never used smokeless tobacco. She reports that she does not drink alcohol and does not use drugs.  Allergies  Allergen Reactions  . Sulfa Antibiotics Hives, Itching, Rash and Swelling  . Morphine Other (See Comments)    "Gives me crazy dreams" (delusions, also)  . Sulfonamide Derivatives Hives, Itching and Swelling    Family History  Problem Relation Age of Onset  . Ulcerative colitis Father   . Hypertension Father   . Heart attack Father   . Hypertension Mother   . Heart disease Mother        s/p pci  . Ulcerative colitis Daughter   . Irritable bowel syndrome Other        grandchildren  . Diabetes Sister   . Cancer Sister        uterine  . Cancer Maternal Uncle        LUNG  . Colon cancer Neg Hx   . Esophageal cancer Neg Hx   . Stomach cancer Neg Hx   . Rectal cancer Neg Hx     Prior to Admission medications   Medication Sig Start Date End Date Taking? Authorizing Provider  acetaminophen (TYLENOL) 500 MG tablet Take 500-1,000 mg by mouth at bedtime.    [provider]  allopurinol (ZYLOPRIM) 300 MG tablet Take 1 tablet (300 mg total) by mouth daily. 12/15/21   Ann Held, DO  Calcium Carbonate Antacid (TUMS EXTRA STRENGTH 750 PO) Take 1-2 tablets by mouth See admin instructions. Chew 1-2 tablets by mouth as needed for heartburn or reflux    [provider]  DULoxetine (CYMBALTA) 60 MG capsule TAKE 2 CAPSULES BY MOUTH EVERY DAY 12/23/21   Carollee Herter, Alferd Apa, DO  famotidine (PEPCID) 20 MG tablet TAKE 1 TABLET BY MOUTH TWICE A DAY Patient taking differently: Take 20 mg by mouth daily as needed for heartburn or indigestion. 04/01/21   Armbruster, Carlota Raspberry, MD  folic acid (FOLVITE) 1  MG tablet Take 2 tablets (2 mg total) by mouth daily. Patient  taking differently: Take 1-2 mg by mouth daily. 05/26/17   [name], MD  metoprolol succinate (TOPROL-XL) 100 MG 24 hr tablet Take 50-100 mg by mouth See admin instructions. Take 100 mg by mouth in the morning and 50 mg at bedtime    [provider]  Vitamin D, Ergocalciferol, (DRISDOL) 1.25 MG (50000 UNIT) CAPS capsule Take 50,000 Units by mouth every 30 (thirty) days.    [provider]    Physical Exam: Vitals:   06/29/22 1500 06/29/22 1700 06/29/22 2035 06/29/22 2203  BP: 117/75 134/82 (!) 145/122 (!) 132/99  Pulse: (!) 103 (!) 109 (!) 121 (!) 112  Resp: (!) 24 17 18 18   Temp:  98 F (36.7 C) 98 F (36.7 C) 98.4 F (36.9 C)  TempSrc:  Oral Oral Oral  SpO2: 94% 100% 100% 99%  Weight:    83.8 kg  Height:    5' 3"  (1.6 m)   *** Data Reviewed: {Tip this will not be part of the note when signed- Document your independent interpretation of telemetry tracing, EKG, lab, Radiology test or any other diagnostic tests. Add any new diagnostic test ordered today. (Optional):26781} {Results:26384}  Assessment and Plan: No notes have been filed under this hospital service. Service: Hospitalist     Advance Care Planning:   Code Status: Prior ***  Consults: ***  Family Communication: ***  Severity of Illness: {Observation/Inpatient:21159}  Author: Orene Desanctis, DO 06/29/2022 11:13 PM  For on call review [email or web address].

## 2022-07-01 LAB — CBC
HCT: 30.5 % — ABNORMAL LOW (ref 36.0–46.0)
Hemoglobin: 9.9 g/dL — ABNORMAL LOW (ref 12.0–15.0)
MCH: 33.7 pg (ref 26.0–34.0)
MCHC: 32.5 g/dL (ref 30.0–36.0)
MCV: 103.7 fL — ABNORMAL HIGH (ref 80.0–100.0)
Platelets: 297 10*3/uL (ref 150–400)
RBC: 2.94 MIL/uL — ABNORMAL LOW (ref 3.87–5.11)
RDW: 15.9 % — ABNORMAL HIGH (ref 11.5–15.5)
WBC: 10.9 10*3/uL — ABNORMAL HIGH (ref 4.0–10.5)
nRBC: 0 % (ref 0.0–0.2)

## 2022-07-01 LAB — COMPREHENSIVE METABOLIC PANEL
ALT: 22 U/L (ref 0–44)
AST: 23 U/L (ref 15–41)
Albumin: 2.9 g/dL — ABNORMAL LOW (ref 3.5–5.0)
Alkaline Phosphatase: 90 U/L (ref 38–126)
Anion gap: 10 (ref 5–15)
BUN: 56 mg/dL — ABNORMAL HIGH (ref 8–23)
CO2: 17 mmol/L — ABNORMAL LOW (ref 22–32)
Calcium: 9 mg/dL (ref 8.9–10.3)
Chloride: 109 mmol/L (ref 98–111)
Creatinine, Ser: 3.6 mg/dL — ABNORMAL HIGH (ref 0.44–1.00)
GFR, Estimated: 13 mL/min — ABNORMAL LOW (ref >60)
Glucose, Bld: 117 mg/dL — ABNORMAL HIGH (ref 70–99)
Potassium: 4 mmol/L (ref 3.5–5.1)
Sodium: 136 mmol/L (ref 135–145)
Total Bilirubin: 0.2 mg/dL — ABNORMAL LOW (ref 0.3–1.2)
Total Protein: 6.5 g/dL (ref 6.5–8.1)

## 2022-07-01 MED ORDER — ALLOPURINOL 300 MG PO TABS: 150.0000 mg | ORAL_TABLET | Freq: Every day | ORAL | 1 refills | Status: DC

## 2022-07-01 MED ORDER — LACTATED RINGERS IV SOLN: INTRAVENOUS | Status: DC

## 2022-07-01 NOTE — Plan of Care (Signed)

## 2022-07-01 NOTE — Discharge Summary (Addendum)
Physician Discharge Summary   Patient: [name] MRN: [id number] DOB: [birth year]  Admit date:     06/29/2022  Discharge date: 07/01/22  Discharge Physician: [name]   PCP: Ann Held, DO   Recommendations at discharge:   Follow-up nephrology as outpatient  Discharge Diagnoses: Principal Problem:   Acute renal failure superimposed on stage 4 chronic kidney disease, unspecified acute renal failure type ([place]) Active Problems:   Hypotension   Sepsis secondary to UTI ([place])   Ileostomy in place [facility])  Resolved Problems:   * No resolved hospital problems. *  Hospital Course:   66 y.o. female with medical history significant of CKD3/4, UC (s/p total colectomy 1998) now with diverting ileostomy (04/23/21), recurrent IDA who had syncopal event at cancer center for follow up of chronic anemia and was referred to the ER.    Pt felt she was already starting to get dehydrated about 3-4 days ago. Tried to go to her hematology appointment today to ask about IV fluids but felt dizzy and almost loss consciousness in the elevator going to office. Had more output in her ileostomy and was more bilious in color.    Assessment and Plan:  Acute kidney injury on CKD stage IV -Presented with creatinine of 5.32, up from baseline 2.6-3 -Let prerenal from increased output from ileostomy -Creatinine this morning has improved to 3.60 -Follow-up nephrology outpatient   Ileostomy in place -Patient history of.  Colitis s/p total colectomy in 1998 and now diverting ileostomy from 04/23/2021 -Continue ileostomy care   SIRS -Presented with sinus tachycardia, leukocytosis and abnormal UA -Started on IV Rocephin -Urine culture showed no growth -Still has elevated WBC -CT abdomen/pelvis did not show any significant abnormality -We will discontinue IV antibiotics; patient is afebrile   Hypertension -Continue home medications         Consultants:  Procedures performed:   Disposition: Home Diet recommendation:  Discharge Diet Orders (From admission, onward)     Start     Ordered   07/01/22 0000  Diet - low sodium heart healthy        07/01/22 1122           Renal diet DISCHARGE MEDICATION: Allergies as of 07/01/2022       Reactions   Sulfa Antibiotics Hives, Itching, Rash, Swelling   Morphine Other (See Comments)   "Gives me crazy dreams" (delusions, also)   Sulfonamide Derivatives Hives, Itching, Swelling        Medication List     TAKE these medications    acetaminophen 500 MG tablet Commonly known as: TYLENOL Take 1,000 mg by mouth daily as needed for moderate pain.   allopurinol 300 MG tablet Commonly known as: ZYLOPRIM Take 0.5 tablets (150 mg total) by mouth daily. What changed: how much to take   amLODipine 5 MG tablet Commonly known as: NORVASC Take 5 mg by mouth daily.   DULoxetine 60 MG capsule Commonly known as: CYMBALTA TAKE 2 CAPSULES BY MOUTH EVERY DAY   famotidine 20 MG tablet Commonly known as: PEPCID TAKE 1 TABLET BY MOUTH TWICE A DAY What changed: when to take this   folic acid 1 MG tablet Commonly known as: FOLVITE Take 2 tablets (2 mg total) by mouth daily. What changed: how much to take   metoprolol succinate 100 MG 24 hr tablet Commonly known as: TOPROL-XL Take 50-100 mg by mouth See admin instructions. Take 100 mg by mouth in the morning and 50 mg at  bedtime   OVER THE COUNTER MEDICATION Take 1 tablet by mouth daily. Nervive   TUMS EXTRA STRENGTH 750 PO Take 2 tablets by mouth daily as needed (heartburn).   Vitamin D (Ergocalciferol) 1.25 MG (50000 UNIT) Caps capsule Commonly known as: DRISDOL Take 50,000 Units by mouth every 30 (thirty) days.        Follow-up Information     Ann Held, DO Follow up.   Specialty: Family Medicine Contact information: [address] [place] [address] [phone]                Discharge Exam: Danley Danker  Weights   06/29/22 2203  Weight: 83.8 kg   General-appears in no acute distress Heart-S1-S2, regular, no murmur auscultated Lungs-clear to auscultation bilaterally, no wheezing or crackles auscultated Abdomen-soft, nontender, no organomegaly Extremities-no edema in the lower extremities Neuro-alert, oriented x3, no focal deficit noted  Condition at discharge: good  The results of significant diagnostics from this hospitalization (including imaging, microbiology, ancillary and laboratory) are listed below for reference.   Imaging Studies: CT ABDOMEN PELVIS WO CONTRAST  Result Date: 06/29/2022 CLINICAL DATA:  Acute left upper quadrant abdominal pain. EXAM: CT ABDOMEN AND PELVIS WITHOUT CONTRAST TECHNIQUE: Multidetector CT imaging of the abdomen and pelvis was performed following the standard protocol without IV contrast. RADIATION DOSE REDUCTION: This exam was performed according to the departmental dose-optimization program which includes automated exposure control, adjustment of the mA and/or kV according to patient size and/or use of iterative reconstruction technique. COMPARISON:  September 22, 2021. FINDINGS: Lower chest: No acute abnormality. Hepatobiliary: No focal liver abnormality is seen. Status post cholecystectomy. No biliary dilatation. Pancreas: Unremarkable. No pancreatic ductal dilatation or surrounding inflammatory changes. Spleen: Normal in size without focal abnormality. Adrenals/Urinary Tract: Adrenal glands are unremarkable. Kidneys are normal, without renal calculi, focal lesion, or hydronephrosis. Bladder is unremarkable. Stomach/Bowel: The stomach appears normal. Status post colectomy with ileostomy seen in right lower quadrant. No abnormal bowel dilatation or inflammation is noted. Vascular/Lymphatic: No significant vascular findings are present. No enlarged abdominal or pelvic lymph nodes. Reproductive: Status post hysterectomy. No adnexal masses. Other: No significant hernia  or ascites is noted. Musculoskeletal: No acute or significant osseous findings. IMPRESSION: Status post colectomy with ileostomy seen in right lower quadrant. No abnormal bowel dilatation or inflammation is noted. No acute abnormality seen in the abdomen or pelvis. Electronically Signed   By: Marijo Conception M.D.   On: 06/29/2022 12:24   DG Chest Portable 1 View  Result Date: 06/29/2022 CLINICAL DATA:  Hypotension. Chronic left upper quadrant abdominal pain. EXAM: PORTABLE CHEST 1 VIEW COMPARISON:  Chest x-ray dated September 22, 2021. FINDINGS: The heart size and mediastinal contours are within normal limits. Both lungs are clear. The visualized skeletal structures are unremarkable. IMPRESSION: No active disease. Electronically Signed   By: Titus Dubin M.D.   On: 06/29/2022 11:09    Microbiology: Results for orders placed or performed during the hospital encounter of 06/29/22  Culture, blood (routine x 2)     Status: None (Preliminary result)   Collection Time: 06/29/22 10:30 AM   Specimen: BLOOD  Result Value Ref Range Status   Specimen Description   Final    BLOOD LEFT ANTECUBITAL Performed at [facility], [address] [place], [place] [address]    Special Requests   Final    BOTTLES DRAWN AEROBIC AND ANAEROBIC Blood Culture adequate volume Performed at [facility],  [place], [place] [address]    Culture   Final    NO GROWTH 2 DAYS Performed at [facility], [place] [address], [place], [place] [address]    Report Status PENDING  Incomplete  Urine Culture     Status: None   Collection Time: 06/29/22  1:23 PM   Specimen: Urine, Clean Catch  Result Value Ref Range Status   Specimen Description   Final    URINE, CLEAN CATCH Performed at [facility], [address] [place], [place] [address]    Special Requests   Final    NONE Performed at [facility], [address] [place], [place] [address]    Culture    Final    NO GROWTH Performed at [facility], [place] [address] [place], [place] [address]    Report Status 06/30/2022 FINAL  Final  Culture, blood (routine x 2)     Status: None (Preliminary result)   Collection Time: 06/29/22  2:47 PM   Specimen: BLOOD LEFT FOREARM  Result Value Ref Range Status   Specimen Description   Final    BLOOD LEFT FOREARM Performed at [facility], [address] [place], [place] [address]    Special Requests   Final    BOTTLES DRAWN AEROBIC AND ANAEROBIC Blood Culture adequate volume Performed at [facility], [address] [place], [place] [address]    Culture   Final    NO GROWTH 2 DAYS Performed at [facility], [place] [address], [place], [place] [address]    Report Status PENDING  Incomplete   *Note: Due to a large number of results and/or encounters for the requested time period, some results have not been displayed. A complete set of results can be found in Results Review.    Labs: CBC: Recent Labs  Lab 06/29/22 1040 06/30/22 0403 07/01/22 0356  WBC 20.9* 16.1* 10.9*  NEUTROABS 15.3*  --   --   HGB 12.8 10.5* 9.9*  HCT 39.1 32.3* 30.5*  MCV 103.2* 104.5* 103.7*  PLT 424* 328 364   Basic Metabolic Panel: Recent Labs  Lab 06/29/22 1040 06/30/22 0403 07/01/22 0356  NA 133* 136 136  K 4.8 4.4 4.0  CL 105 112* 109  CO2 14* 13* 17*  GLUCOSE 128* 102* 117*  BUN 69* 63* 56*  CREATININE 5.32* 4.56* 3.60*  CALCIUM 10.4* 9.2 9.0   Liver Function Tests: Recent Labs  Lab 06/29/22 1040 07/01/22 0356  AST 28 23  ALT 31 22  ALKPHOS 105 90  BILITOT 0.6 0.2*  PROT 9.3* 6.5  ALBUMIN 4.2 2.9*   CBG: No results for input(s): "GLUCAP" in the last 168 hours.  Discharge time spent: greater than 30 minutes.  Signed: [name], MD [facility] 07/01/2022

## 2022-07-02 ENCOUNTER — Encounter: Payer: Self-pay | Admitting: Family

## 2022-07-02 NOTE — Progress Notes (Signed)
Erroneous encounter

## 2022-07-04 LAB — CULTURE, BLOOD (ROUTINE X 2)
Culture: NO GROWTH
Culture: NO GROWTH
Special Requests: ADEQUATE
Special Requests: ADEQUATE

## 2022-07-05 ENCOUNTER — Other Ambulatory Visit: Payer: Self-pay | Admitting: Family Medicine

## 2022-07-05 ENCOUNTER — Telehealth: Payer: Self-pay | Admitting: *Deleted

## 2022-07-05 ENCOUNTER — Encounter: Payer: Self-pay | Admitting: *Deleted

## 2022-07-05 DIAGNOSIS — F322 Major depressive disorder, single episode, severe without psychotic features: Secondary | ICD-10-CM

## 2022-07-05 NOTE — Patient Outreach (Addendum)
  Care Coordination [facility] Note Transition Care Management Follow-up Telephone Call Date of discharge and from where: Thursday 07/01/22 [name]; acute on chronic renal failure; sepsis UTI/ hypotension How have you been since you were released from the hospital? "I am doing much better but feel like I may be starting to get dehydrated again; I have a call in to the GI doctor to see if I can arrange a sooner appointment, but I am trying to stay hydrated on my own.  The kidney doctor told me to go to the GI doctor for IV infusions to help with this constant and ongoing dehydration; I know everything I am supposed to do to keep myself dehydrated- I have been going through this for a long time.  I am doing fine taking care of myself and don't need any help at this point; my boyfriend is keeping a close eye on me too" Any questions or concerns? No  Items Reviewed: Did the pt receive and understand the discharge instructions provided? Yes  Medications obtained and verified? Yes  Other? No  Any new allergies since your discharge? No  Dietary orders reviewed? Yes Do you have support at home? Yes  boyfriend and family assisting as/ if needed/ indicated  Home Care and Equipment/Supplies: Were home health services ordered? no If so, what is the name of the agency? N/A  Has the agency set up a time to come to the patient's home? not applicable Were any new equipment or medical supplies ordered?  No What is the name of the medical supply agency? N/A Were you able to get the supplies/equipment? not applicable Do you have any questions related to the use of the equipment or supplies? No N/A  Functional Questionnaire: (I = Independent and D = Dependent) ADLs: I  Bathing/Dressing- I  Meal Prep- I  Eating- I  Maintaining continence- I  Transferring/Ambulation- I  Managing Meds- I  Follow up appointments reviewed:  PCP Hospital f/u appt confirmed? No  Scheduled to see - on - @ [facility] f/u appt confirmed? Yes  Scheduled to see GI specialist for office IV infusion on Monday 07/26/22 @ 11:00 AM Are transportation arrangements needed? No  If their condition worsens, is the pt aware to call PCP or go to the Emergency Dept.? Yes Was the patient provided with contact information for the PCP's office or ED? Yes Was to pt encouraged to call back with questions or concerns? Yes  SDOH assessments and interventions completed:   Yes  Care Coordination Interventions Activated:  No   Care Coordination Interventions:  No Care Coordination interventions needed at this time.   Encounter Outcome:  Pt. Visit Completed    Oneta Rack, RN, BSN, CCRN Alumnus RN CM Care Coordination/ Transition of [place] Management [phone]: direct office

## 2022-07-06 ENCOUNTER — Encounter (HOSPITAL_COMMUNITY): Payer: Self-pay

## 2022-07-06 ENCOUNTER — Other Ambulatory Visit: Payer: Self-pay

## 2022-07-06 ENCOUNTER — Inpatient Hospital Stay (HOSPITAL_COMMUNITY)
Admission: EM | Admit: 2022-07-06 | Discharge: 2022-07-12 | DRG: 683 | Disposition: A | Discharge status: Home / Self Care | Payer: Medicare Other | Attending: Family Medicine | Admitting: Family Medicine

## 2022-07-06 ENCOUNTER — Telehealth: Payer: Self-pay | Admitting: Family Medicine

## 2022-07-06 DIAGNOSIS — Z882 Allergy status to sulfonamides status: Secondary | ICD-10-CM | POA: Diagnosis not present

## 2022-07-06 DIAGNOSIS — Z6832 Body mass index (BMI) 32.0-32.9, adult: Secondary | ICD-10-CM

## 2022-07-06 DIAGNOSIS — E876 Hypokalemia: Secondary | ICD-10-CM | POA: Diagnosis not present

## 2022-07-06 DIAGNOSIS — K529 Noninfective gastroenteritis and colitis, unspecified: Secondary | ICD-10-CM | POA: Diagnosis present

## 2022-07-06 DIAGNOSIS — K219 Gastro-esophageal reflux disease without esophagitis: Secondary | ICD-10-CM | POA: Diagnosis not present

## 2022-07-06 DIAGNOSIS — K5080 Crohn's disease of both small and large intestine without complications: Secondary | ICD-10-CM | POA: Diagnosis present

## 2022-07-06 DIAGNOSIS — E8729 Other acidosis: Secondary | ICD-10-CM | POA: Diagnosis not present

## 2022-07-06 DIAGNOSIS — G2581 Restless legs syndrome: Secondary | ICD-10-CM | POA: Diagnosis not present

## 2022-07-06 DIAGNOSIS — E875 Hyperkalemia: Secondary | ICD-10-CM | POA: Diagnosis present

## 2022-07-06 DIAGNOSIS — F32A Depression, unspecified: Secondary | ICD-10-CM | POA: Diagnosis present

## 2022-07-06 DIAGNOSIS — N179 Acute kidney failure, unspecified: Secondary | ICD-10-CM | POA: Diagnosis not present

## 2022-07-06 DIAGNOSIS — Z79899 Other long term (current) drug therapy: Secondary | ICD-10-CM

## 2022-07-06 DIAGNOSIS — I129 Hypertensive chronic kidney disease with stage 1 through stage 4 chronic kidney disease, or unspecified chronic kidney disease: Secondary | ICD-10-CM | POA: Diagnosis present

## 2022-07-06 DIAGNOSIS — K90822 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: Secondary | ICD-10-CM | POA: Diagnosis not present

## 2022-07-06 DIAGNOSIS — Z885 Allergy status to narcotic agent status: Secondary | ICD-10-CM

## 2022-07-06 DIAGNOSIS — N184 Chronic kidney disease, stage 4 (severe): Secondary | ICD-10-CM | POA: Diagnosis not present

## 2022-07-06 DIAGNOSIS — I951 Orthostatic hypotension: Secondary | ICD-10-CM

## 2022-07-06 DIAGNOSIS — Z66 Do not resuscitate: Secondary | ICD-10-CM | POA: Diagnosis not present

## 2022-07-06 DIAGNOSIS — R55 Syncope and collapse: Secondary | ICD-10-CM | POA: Diagnosis present

## 2022-07-06 DIAGNOSIS — Z87891 Personal history of nicotine dependence: Secondary | ICD-10-CM

## 2022-07-06 DIAGNOSIS — Z8719 Personal history of other diseases of the digestive system: Secondary | ICD-10-CM

## 2022-07-06 DIAGNOSIS — Z8249 Family history of ischemic heart disease and other diseases of the circulatory system: Secondary | ICD-10-CM

## 2022-07-06 DIAGNOSIS — E785 Hyperlipidemia, unspecified: Secondary | ICD-10-CM | POA: Diagnosis present

## 2022-07-06 DIAGNOSIS — E872 Acidosis, unspecified: Secondary | ICD-10-CM | POA: Diagnosis present

## 2022-07-06 DIAGNOSIS — I10 Essential (primary) hypertension: Secondary | ICD-10-CM | POA: Diagnosis present

## 2022-07-06 DIAGNOSIS — E860 Dehydration: Secondary | ICD-10-CM | POA: Diagnosis present

## 2022-07-06 DIAGNOSIS — Z833 Family history of diabetes mellitus: Secondary | ICD-10-CM

## 2022-07-06 DIAGNOSIS — K50819 Crohn's disease of both small and large intestine with unspecified complications: Secondary | ICD-10-CM | POA: Diagnosis not present

## 2022-07-06 DIAGNOSIS — Z87410 Personal history of cervical dysplasia: Secondary | ICD-10-CM

## 2022-07-06 DIAGNOSIS — N189 Chronic kidney disease, unspecified: Secondary | ICD-10-CM | POA: Diagnosis not present

## 2022-07-06 DIAGNOSIS — E871 Hypo-osmolality and hyponatremia: Secondary | ICD-10-CM | POA: Diagnosis not present

## 2022-07-06 DIAGNOSIS — E861 Hypovolemia: Secondary | ICD-10-CM | POA: Diagnosis present

## 2022-07-06 DIAGNOSIS — E669 Obesity, unspecified: Secondary | ICD-10-CM | POA: Diagnosis present

## 2022-07-06 DIAGNOSIS — Z932 Ileostomy status: Secondary | ICD-10-CM

## 2022-07-06 DIAGNOSIS — Z9049 Acquired absence of other specified parts of digestive tract: Secondary | ICD-10-CM

## 2022-07-06 DIAGNOSIS — Z8049 Family history of malignant neoplasm of other genital organs: Secondary | ICD-10-CM

## 2022-07-06 DIAGNOSIS — Z90710 Acquired absence of both cervix and uterus: Secondary | ICD-10-CM

## 2022-07-06 LAB — CBC
HCT: 39.5 % (ref 36.0–46.0)
Hemoglobin: 12.9 g/dL (ref 12.0–15.0)
MCH: 33.5 pg (ref 26.0–34.0)
MCHC: 32.7 g/dL (ref 30.0–36.0)
MCV: 102.6 fL — ABNORMAL HIGH (ref 80.0–100.0)
Platelets: 402 10*3/uL — ABNORMAL HIGH (ref 150–400)
RBC: 3.85 MIL/uL — ABNORMAL LOW (ref 3.87–5.11)
RDW: 15.6 % — ABNORMAL HIGH (ref 11.5–15.5)
WBC: 23.6 10*3/uL — ABNORMAL HIGH (ref 4.0–10.5)
nRBC: 0 % (ref 0.0–0.2)

## 2022-07-06 LAB — BASIC METABOLIC PANEL
Anion gap: 18 — ABNORMAL HIGH (ref 5–15)
BUN: 83 mg/dL — ABNORMAL HIGH (ref 8–23)
CO2: 15 mmol/L — ABNORMAL LOW (ref 22–32)
Calcium: 10 mg/dL (ref 8.9–10.3)
Chloride: 96 mmol/L — ABNORMAL LOW (ref 98–111)
Creatinine, Ser: 6.49 mg/dL — ABNORMAL HIGH (ref 0.44–1.00)
GFR, Estimated: 7 mL/min — ABNORMAL LOW (ref >60)
Glucose, Bld: 126 mg/dL — ABNORMAL HIGH (ref 70–99)
Potassium: 5.3 mmol/L — ABNORMAL HIGH (ref 3.5–5.1)
Sodium: 129 mmol/L — ABNORMAL LOW (ref 135–145)

## 2022-07-06 LAB — CBG MONITORING, ED: Glucose-Capillary: 131 mg/dL — ABNORMAL HIGH (ref 70–99)

## 2022-07-06 MED ORDER — TRAZODONE HCL 50 MG PO TABS: 25.0000 mg | ORAL_TABLET | Freq: Every evening | ORAL | Status: DC | PRN

## 2022-07-06 MED ORDER — METOPROLOL SUCCINATE ER 50 MG PO TB24
50.0000 mg | ORAL_TABLET | Freq: Every day | ORAL | Status: DC
  Administered 2022-07-06 – 2022-07-11 (×6): 50 mg via ORAL
  Filled 2022-07-06 (×6): qty 1

## 2022-07-06 MED ORDER — ONDANSETRON HCL 4 MG PO TABS
4.0000 mg | ORAL_TABLET | Freq: Four times a day (QID) | ORAL | Status: DC | PRN
  Filled 2022-07-06: qty 1

## 2022-07-06 MED ORDER — SODIUM CHLORIDE 0.9 % IV BOLUS
1000.0000 mL | Freq: Once | INTRAVENOUS | Status: AC
  Administered 2022-07-06: 1000 mL via INTRAVENOUS

## 2022-07-06 MED ORDER — MAGNESIUM HYDROXIDE 400 MG/5ML PO SUSP: 30.0000 mL | Freq: Every day | ORAL | Status: DC | PRN

## 2022-07-06 MED ORDER — CALCIUM CARBONATE ANTACID 500 MG PO CHEW: 750.0000 mg | CHEWABLE_TABLET | Freq: Every day | ORAL | Status: DC | PRN

## 2022-07-06 MED ORDER — DULOXETINE HCL 60 MG PO CPEP
120.0000 mg | ORAL_CAPSULE | Freq: Every day | ORAL | Status: DC
  Administered 2022-07-07 – 2022-07-12 (×6): 120 mg via ORAL
  Filled 2022-07-06 (×6): qty 2

## 2022-07-06 MED ORDER — VITAMIN D (ERGOCALCIFEROL) 1.25 MG (50000 UNIT) PO CAPS
50000.0000 [IU] | ORAL_CAPSULE | ORAL | Status: DC
  Filled 2022-07-06: qty 1

## 2022-07-06 MED ORDER — DIPHENHYDRAMINE HCL 25 MG PO CAPS
25.0000 mg | ORAL_CAPSULE | Freq: Every day | ORAL | Status: DC
  Administered 2022-07-06 – 2022-07-11 (×6): 25 mg via ORAL
  Filled 2022-07-06 (×6): qty 1

## 2022-07-06 MED ORDER — METOPROLOL SUCCINATE ER 100 MG PO TB24
100.0000 mg | ORAL_TABLET | Freq: Every day | ORAL | Status: DC
  Administered 2022-07-07 – 2022-07-12 (×6): 100 mg via ORAL
  Filled 2022-07-06 (×6): qty 1

## 2022-07-06 MED ORDER — ENOXAPARIN SODIUM 30 MG/0.3ML IJ SOSY
30.0000 mg | PREFILLED_SYRINGE | INTRAMUSCULAR | Status: DC
  Administered 2022-07-07 – 2022-07-11 (×5): 30 mg via SUBCUTANEOUS
  Filled 2022-07-06 (×6): qty 0.3

## 2022-07-06 MED ORDER — SIMETHICONE 80 MG PO CHEW
120.0000 mg | CHEWABLE_TABLET | Freq: Every day | ORAL | Status: DC
  Administered 2022-07-06 – 2022-07-11 (×6): 120 mg via ORAL
  Filled 2022-07-06 (×6): qty 2

## 2022-07-06 MED ORDER — ALLOPURINOL 300 MG PO TABS
150.0000 mg | ORAL_TABLET | Freq: Every day | ORAL | Status: DC
  Administered 2022-07-06 – 2022-07-11 (×6): 150 mg via ORAL
  Filled 2022-07-06: qty 0.5
  Filled 2022-07-06 (×5): qty 1

## 2022-07-06 MED ORDER — SODIUM CHLORIDE 0.9 % IV SOLN: INTRAVENOUS | Status: DC

## 2022-07-06 MED ORDER — ACETAMINOPHEN 650 MG RE SUPP: 650.0000 mg | Freq: Four times a day (QID) | RECTAL | Status: DC | PRN

## 2022-07-06 MED ORDER — FOLIC ACID 1 MG PO TABS
1.0000 mg | ORAL_TABLET | Freq: Every day | ORAL | Status: DC
  Administered 2022-07-07 – 2022-07-12 (×6): 1 mg via ORAL
  Filled 2022-07-06 (×6): qty 1

## 2022-07-06 MED ORDER — ACETAMINOPHEN 325 MG PO TABS: 650.0000 mg | ORAL_TABLET | Freq: Four times a day (QID) | ORAL | Status: DC | PRN

## 2022-07-06 MED ORDER — SODIUM CHLORIDE 0.9 % IV SOLN: Freq: Once | INTRAVENOUS | Status: AC

## 2022-07-06 MED ORDER — LOPERAMIDE HCL 2 MG PO CAPS
4.0000 mg | ORAL_CAPSULE | Freq: Every day | ORAL | Status: DC
  Administered 2022-07-06: 4 mg via ORAL
  Filled 2022-07-06: qty 2

## 2022-07-06 MED ORDER — ONDANSETRON HCL 4 MG/2ML IJ SOLN: 4.0000 mg | Freq: Four times a day (QID) | INTRAMUSCULAR | Status: DC | PRN

## 2022-07-06 NOTE — H&P (Signed)
[facility]   PATIENT NAME: [name]    MR#:  [id number]  DATE OF BIRTH:  02/17/1956  DATE OF ADMISSION:  07/06/2022  PRIMARY CARE PHYSICIAN: Ann Held, DO   Patient is coming from: Home  REQUESTING/REFERRING PHYSICIAN: Tomasa Rand, MD  CHIEF COMPLAINT:   Chief Complaint  Patient presents with   Loss of Consciousness    HISTORY OF PRESENT ILLNESS:  [name] is a 66 y.o. 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  The patient was getting from her bed to the bathroom and upon sitting on her commode she passed out and fell without injuries.  She denied any nausea or vomiting or melena or bright red bleeding with her ileostomy.  She denies any chest pain or dyspnea or cough.  No fever or chills.  No dysuria, oliguria, urinary frequency or urgency or flank pain.  ED Course: When the patient came to the ER, BP was 97/69 with heart rate of 112 with otherwise normal vital signs.  Labs revealed a sodium of 129 and chloride of 96, potassium of 5.3, CO2 of 15 and glucose of 126 with a BUN of 83 and creatinine of 6.49 compared to 56/3.6 on 07/01/2022 anion gap of 18. EKG as reviewed by me : EKG showed sinus tachycardia with rate of 106. Imaging: Portable chest x-ray showed no acute cardiopulmonary disease.  The patient was given 1 L bolus of IV normal saline followed by 125 mill per hour.  She will be admitted to a medical telemetry observation bed for further evaluation and management. PAST MEDICAL HISTORY:   Past Medical History:  Diagnosis Date   Abdominal pain    Hx   Allergy    Anal stenosis    Anemia    Anxiety    Arthritis    Asthma    patient does not have inhaler   Blood in stool    Hx   Blood in urine    Hx   Blood  transfusion without reported diagnosis    Cataract    CKD (chronic kidney disease) stage 3, GFR 30-59 ml/min (HCC)    Crohn's colitis, other complication (HCC)    De Quervain's tenosynovitis    Depression    Difficulty urinating    Hx   Easy bruising    Esophagitis    Fainting    History - resolved - due to dehydration   Fatigue    Hx   Fibroid    Hx   Gastric polyp    GERD (gastroesophageal reflux disease)    Hearing loss    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   Hemorrhoids, external    Hemorrhoids, internal    Herpes, genital    vaginal treated 07/05/12 and pt states is resolved   History of cervical dysplasia    History of small bowel obstruction    Hyperlipidemia    currently no meds   Hyperparathyroidism    Hypertension    Hypokalemia    Hx   IBD (inflammatory bowel disease)    initially colectomy for suspected UC, 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   Obesity    Ovarian cyst    Poor dental hygiene    Pulmonary nodule, right  right upper lobe   Rectal bleeding    Hx   Rectal pain    Hx   Renal insufficiency    CKD - stage 3   RLS (restless legs syndrome)    no meds   Tooth infection 11/2016   right low   Ulcerative colitis    Visual disturbance    wears glasses   Weakness generalized    Hx - patient denies generalized weakness   Wears dentures    upper only    PAST SURGICAL HISTORY:   Past Surgical History:  Procedure Laterality Date   ANAL DILATION     CERVICAL BIOPSY  W/ LOOP ELECTRODE EXCISION     CHOLECYSTECTOMY     COLONOSCOPY     [name]   fatty tumor removed from back     X 2   HEMORRHOID SURGERY     ILEOSTOMY CLOSURE     RESTORATIVE PROCTOCOLECTOMY     with insertion of ileoanal J Pouch with loop ileostomy   SHOULDER ARTHROSCOPY WITH CAPSULORRHAPHY Left 06/14/2019   Procedure: LEFT SHOULDER ARTHRSCOPIC REPAIR OF BONY BANKART FRACTURE;  Surgeon: Tania Ade, MD;  Location: [facility];  Service: Orthopedics;   Laterality: Left;  REQUEST 90 MINUTE   SIGMOIDOSCOPY     TOTAL ABDOMINAL HYSTERECTOMY  1998   TAH/LSO   TUBAL LIGATION     UPPER GASTROINTESTINAL ENDOSCOPY     [name]    SOCIAL HISTORY:   Social History   Tobacco Use   Smoking status: Former    Packs/day: 1.00    Years: 4.00    Total pack years: 4.00    Types: Cigarettes    Quit date: 05/21/1979    Years since quitting: 43.1   Smokeless tobacco: Never  Substance Use Topics   Alcohol use: No    Alcohol/week: 0.0 standard drinks of alcohol    FAMILY HISTORY:   Family History  Problem Relation Age of Onset   Ulcerative colitis Father    Hypertension Father    Heart attack Father    Hypertension Mother    Heart disease Mother        s/p pci   Ulcerative colitis Daughter    Irritable bowel syndrome Other        grandchildren   Diabetes Sister    Cancer Sister        uterine   Cancer Maternal Uncle        LUNG   Colon cancer Neg Hx    Esophageal cancer Neg Hx    Stomach cancer Neg Hx    Rectal cancer Neg Hx     DRUG ALLERGIES:   Allergies  Allergen Reactions   Sulfa Antibiotics Hives, Itching, Rash and Swelling   Morphine Other (See Comments)    "Gives me crazy dreams" (delusions, also)   Sulfonamide Derivatives Hives, Itching and Swelling    REVIEW OF SYSTEMS:   ROS As per history of present illness. All pertinent systems were reviewed above. Constitutional, HEENT, cardiovascular, respiratory, GI, GU, musculoskeletal, neuro, psychiatric, endocrine, integumentary and hematologic systems were reviewed and are otherwise negative/unremarkable except for positive findings mentioned above in the HPI.   MEDICATIONS AT HOME:   Prior to Admission medications   Medication Sig Start Date End Date Taking? Authorizing Provider  acetaminophen (TYLENOL) 500 MG tablet Take 1,000 mg by mouth daily as needed for moderate pain.    [provider]  allopurinol (ZYLOPRIM) 300 MG tablet TAKE ONE TABLET BY MOUTH  DAILY AT  9AM 07/05/22   Roma Schanz R, DO  amLODipine (NORVASC) 5 MG tablet Take 5 mg by mouth daily. 05/20/22   [provider]  Calcium Carbonate Antacid (TUMS EXTRA STRENGTH 750 PO) Take 2 tablets by mouth daily as needed (heartburn).    [provider]  DULoxetine (CYMBALTA) 60 MG capsule TAKE TWO CAPSULES (120MG) BY MOUTH DAILY AT 9AM 07/05/22   Lowne Chase, Yvonne R, DO  famotidine (PEPCID) 20 MG tablet TAKE 1 TABLET BY MOUTH TWICE A DAY Patient taking differently: Take 20 mg by mouth daily. 04/01/21   Armbruster, Carlota Raspberry, MD  folic acid (FOLVITE) 1 MG tablet Take 2 tablets (2 mg total) by mouth daily. Patient taking differently: Take 1 mg by mouth daily. 05/26/17   [name], MD  metoprolol succinate (TOPROL-XL) 100 MG 24 hr tablet Take 50-100 mg by mouth See admin instructions. Take 100 mg by mouth in the morning and 50 mg at bedtime    [provider]  OVER THE COUNTER MEDICATION Take 1 tablet by mouth daily. Nervive    [provider]  Vitamin D, Ergocalciferol, (DRISDOL) 1.25 MG (50000 UNIT) CAPS capsule Take 50,000 Units by mouth every 30 (thirty) days.    [provider]      VITAL SIGNS:  Blood pressure 116/88, pulse 96, temperature 97.6 F (36.4 C), resp. rate 16, SpO2 100 %.  PHYSICAL EXAMINATION:  Physical Exam  GENERAL:  66 y.o.-year-old Caucasian female patient lying in the bed with no acute distress.  EYES: Pupils equal, round, reactive to light and accommodation. No scleral icterus. Extraocular muscles intact.  HEENT: Head atraumatic, normocephalic. Oropharynx and nasopharynx clear.  NECK:  Supple, no jugular venous distention. No thyroid enlargement, no tenderness.  LUNGS: Normal breath sounds bilaterally, no wheezing, rales,rhonchi or crepitation. No use of accessory muscles of respiration.  CARDIOVASCULAR: Regular rate and rhythm, S1, S2 normal. No murmurs, rubs, or gallops.  ABDOMEN: Soft, nondistended,  nontender. Bowel sounds present. No organomegaly or mass.  Ileostomy intact with brownish stools. EXTREMITIES: No pedal edema, cyanosis, or clubbing.  NEUROLOGIC: Cranial nerves II through XII are intact. Muscle strength 5/5 in all extremities. Sensation intact. Gait not checked.  PSYCHIATRIC: The patient is alert and oriented x 3.  Normal affect and good eye contact. SKIN: No obvious rash, lesion, or ulcer.   LABORATORY PANEL:   CBC Recent Labs  Lab 07/06/22 1933  WBC 23.6*  HGB 12.9  HCT 39.5  PLT 402*   ------------------------------------------------------------------------------------------------------------------  Chemistries  Recent Labs  Lab 07/01/22 0356 07/06/22 1933  NA 136 129*  K 4.0 5.3*  CL 109 96*  CO2 17* 15*  GLUCOSE 117* 126*  BUN 56* 83*  CREATININE 3.60* 6.49*  CALCIUM 9.0 10.0  AST 23  --   ALT 22  --   ALKPHOS 90  --   BILITOT 0.2*  --    ------------------------------------------------------------------------------------------------------------------  Cardiac Enzymes No results for input(s): "TROPONINI" in the last 168 hours. ------------------------------------------------------------------------------------------------------------------  RADIOLOGY:  No results found.    IMPRESSION AND PLAN:  Assessment and Plan: * Acute kidney injury superimposed on chronic kidney disease ([place]) - This is AKI superimposed on CKD 4-5.  This is clearly secondary to high output from her ileostomy and subsequent volume depletion and dehydration. - The patient will be admitted to a medical telemetry observation bed. - We will continue hydration with IV normal saline. - We will follow serial BMPs. - We will avoid nephrotoxins. - Nephrology consult  can be called in AM. - I sent a timed message to Dr. Augustin Coupe.  Orthostatic hypotension - This is likely the culprit for her syncope from associated volume depletion. - We will continue hydration as mentioned above  and follow orthostatics.  GERD without esophagitis - We will continue PPI therapy.  High anion gap metabolic acidosis - This is clearly secondary to #1. - We will continue hydration and management as above and follow BMPs.    DVT prophylaxis: Lovenox.  Advanced Care Planning:  Code Status: The patient is DNR/DNI.  This was discussed with her. Family Communication:  The plan of care was discussed in details with the patient (and family). I answered all questions. The patient agreed to proceed with the above mentioned plan. Further management will depend upon hospital course. Disposition Plan: Back to previous home environment Consults called: none.  All the records are reviewed and case discussed with ED provider.  Status is: Observation  I certify that at the time of admission, it is my clinical judgment that the patient will require hospital care extending less than 2 midnights.                            Dispo: The patient is from: Home              Anticipated d/c is to: Home              Patient currently is not medically stable to d/c.              Difficult to place patient: No  Christel Mormon M.D on 07/06/2022 at 11:59 PM  [facility]   From 7 PM-7 AM, contact night-coverage [email or web address]  CC: Primary care physician; Ann Held, DO

## 2022-07-06 NOTE — ED Provider Notes (Signed)
[place] DEPT Provider Note   CSN: [id number] Arrival date & time: 07/06/22  1755     History  Chief Complaint  Patient presents with   Loss of Consciousness    [name] is a 66 y.o. female.  HPI Patient reports she had ongoing problems with dehydration secondary to high output ileostomy.  Patient reports she tries to drink up to three 60 ounce beverages daily.  She reports however, fluid just pours out of her ileostomy and is thin liquid.  The more she drinks, the more the ileostomy puts out.  She denies that she has vomiting.  She reports she tries to stay hydrated but sometimes she just gets very weak.  Patient reports this morning she ended up collapsing to the floor due to general weakness.  She denies she had a fever or pain.  She reports this has been a problem ongoing.  She is working both with gastroenterology and nephrology.  She reports at one point time she was getting IV fluids on outpatient basis 3 times a week and things seem to go better at that time.  No chest pain, no shortness of breath, no abdominal pain.    Home Medications Prior to Admission medications   Medication Sig Start Date End Date Taking? Authorizing Provider  acetaminophen (TYLENOL) 500 MG tablet Take 1,000 mg by mouth at bedtime as needed (for pain/sleep).   Yes [provider]  allopurinol (ZYLOPRIM) 300 MG tablet TAKE ONE TABLET BY MOUTH DAILY AT 9AM Patient taking differently: Take 150 mg by mouth at bedtime. 07/05/22  Yes Roma Schanz R, DO  amLODipine (NORVASC) 5 MG tablet Take 5 mg by mouth daily. 05/20/22  Yes [provider]  Calcium Carbonate Antacid (TUMS EXTRA STRENGTH 750 PO) Take 2 tablets by mouth daily as needed (heartburn).   Yes [provider]  diphenhydrAMINE (BENADRYL ALLERGY) 25 MG tablet Take 25 mg by mouth at bedtime.   Yes [provider]  DULoxetine (CYMBALTA) 60 MG capsule TAKE TWO CAPSULES  (120MG) BY MOUTH DAILY AT 9AM Patient taking differently: Take 120 mg by mouth daily. 07/05/22  Yes Ann Held, DO  folic acid (FOLVITE) 1 MG tablet Take 2 tablets (2 mg total) by mouth daily. Patient taking differently: Take 1 mg by mouth daily. 05/26/17  Yes Ennever, Rudell Cobb, MD  GAS-X EXTRA STRENGTH 125 MG CAPS Take 125 mg by mouth at bedtime.   Yes [provider]  loperamide (IMODIUM A-D) 2 MG tablet Take 4 mg by mouth at bedtime.   Yes [provider]  metoprolol succinate (TOPROL-XL) 100 MG 24 hr tablet Take 50-100 mg by mouth See admin instructions. Take 100 mg by mouth in the morning and 50 mg at bedtime   Yes [provider]  OVER THE COUNTER MEDICATION Take 1 tablet by mouth See admin instructions. Nervive Nerve Health tablet- Take 1 tablet by mouth at bedtime   Yes [provider]  Vitamin D, Ergocalciferol, (DRISDOL) 1.25 MG (50000 UNIT) CAPS capsule Take 50,000 Units by mouth every 30 (thirty) days.   Yes [provider]  famotidine (PEPCID) 20 MG tablet TAKE 1 TABLET BY MOUTH TWICE A DAY Patient not taking: Reported on 07/06/2022 04/01/21   Armbruster, Carlota Raspberry, MD      Allergies    Sulfa antibiotics, Morphine, and Sulfonamide derivatives    Review of Systems   Review of Systems  Physical Exam Updated Vital Signs BP  106/77   Pulse 99   Temp 97.6 F (36.4 C)   Resp 16   SpO2 97%  Physical Exam Constitutional:      Comments: Patient is pale.  Alert and nontoxic.  Mental status clear.  No respiratory distress at rest.  Frail appearance.  HENT:     Mouth/Throat:     Pharynx: Oropharynx is clear.  Eyes:     Extraocular Movements: Extraocular movements intact.  Cardiovascular:     Rate and Rhythm: Regular rhythm. Tachycardia present.  Pulmonary:     Effort: Pulmonary effort is normal.     Breath sounds: Normal breath sounds.  Abdominal:     Comments: Patient's ileostomy bag has large amount of watery green fluid.   Abdomen is soft without guarding.  Musculoskeletal:        General: No swelling. Normal range of motion.  Skin:    General: Skin is warm and dry.     Coloration: Skin is pale.  Neurological:     General: No focal deficit present.     Mental Status: She is oriented to person, place, and time.     Comments: Generally weak but no focal weakness.  Psychiatric:        Mood and Affect: Mood normal.     ED Results / Procedures / Treatments   Labs (all labs ordered are listed, but only abnormal results are displayed) Labs Reviewed  BASIC METABOLIC PANEL - Abnormal; Notable for the following components:      Result Value   Sodium 129 (*)    Potassium 5.3 (*)    Chloride 96 (*)    CO2 15 (*)    Glucose, Bld 126 (*)    BUN 83 (*)    Creatinine, Ser 6.49 (*)    GFR, Estimated 7 (*)    Anion gap 18 (*)    All other components within normal limits  CBC - Abnormal; Notable for the following components:   WBC 23.6 (*)    RBC 3.85 (*)    MCV 102.6 (*)    RDW 15.6 (*)    Platelets 402 (*)    All other components within normal limits  CBG MONITORING, ED - Abnormal; Notable for the following components:   Glucose-Capillary 131 (*)    All other components within normal limits  URINALYSIS, ROUTINE W REFLEX MICROSCOPIC    EKG EKG Interpretation  Date/Time:  Tuesday July 06 2022 18:03:44 EDT Ventricular Rate:  106 PR Interval:  160 QRS Duration: 109 QT Interval:  340 QTC Calculation: 452 R Axis:   -58 Text Interpretation: Sinus tachycardia no sig change from previous Confirmed by Charlesetta Shanks [phone]) on 07/06/2022 7:36:31 PM  Radiology No results found.  Procedures Procedures    Medications Ordered in ED Medications  sodium chloride 0.9 % bolus 1,000 mL (1,000 mLs Intravenous Bolus 07/06/22 2047)  0.9 %  sodium chloride infusion ( Intravenous New Bag/Given 07/06/22 2047)    ED Course/ Medical Decision Making/ A&P                           Medical Decision  Making Amount and/or Complexity of Data Reviewed Labs: ordered.  Risk Prescription drug management. Decision regarding hospitalization.  Patient presents with severe generalized weakness and collapse at home.  Medical history is significant for chronic stage IV renal insufficiency.  She also chronically has high output ileostomy.  No fevers, no vomiting, no abdominal pain.  At  this time we will proceed with screening lab work for electrolytes, renal function, CBC.  Patient's blood pressures are soft in the mid 03O systolic and heart rates in the low 100s consistent with volume depletion.  Will initiate recitation with normal saline.  Patient has mild hypokalemia at 129 and acute on chronic renal failure with BUN 83 creatinine 6.4.  Potassium 5.3.  EKG reviewed by myself no significant change from previous.  No signs of hyperkalemia on EKG.  Patient does not have abdominal pain or vomiting.  At this time I do not have suspicion of intra-abdominal obstruction or other acute etiology for symptoms.  Do not think we need to proceed with CT of the abdomen currently.  Consult: 20: 28 reviewed with nephrology Dr. Augustin Coupe.  Recommends aggressive rehydration with normal saline and recheck labs.  No transfer for dialysis at this time. Consult: 20: 43 reviewed with Dr. Sidney Ace, [facility] for admission.  Due to significant acute increase in renal dysfunction, continued high volume output of her ileostomy, soft blood pressures and tachycardia, patient will need admission for rehydration and electrolyte management.        Final Clinical Impression(s) / ED Diagnoses Final diagnoses:  Acute renal failure superimposed on chronic kidney disease, unspecified acute renal failure type, unspecified CKD stage [facility])    Rx / DC Orders ED Discharge Orders     None         Charlesetta Shanks, MD 07/06/22 2207

## 2022-07-06 NOTE — Assessment & Plan Note (Signed)
-   This is clearly secondary to #1. - We will continue hydration and management as above and follow BMPs.

## 2022-07-06 NOTE — ED Triage Notes (Signed)
Patient recently discharged from the hospital with a UTI on Thursday. Has been feeling terrible ever since. Has had multiple falls after losing consciousness. Weak and dehydrated feeling.

## 2022-07-06 NOTE — Assessment & Plan Note (Signed)
-   We will continue PPI therapy 

## 2022-07-06 NOTE — Assessment & Plan Note (Signed)
-   This is likely the culprit for her syncope from associated volume depletion. - We will continue hydration as mentioned above and follow orthostatics.

## 2022-07-06 NOTE — Assessment & Plan Note (Addendum)
-   This is AKI superimposed on CKD 4-5.  This is clearly secondary to high output from her ileostomy and subsequent volume depletion and dehydration. - The patient will be admitted to a medical telemetry observation bed. - We will continue hydration with IV normal saline. - We will follow serial BMPs. - We will avoid nephrotoxins. - Nephrology consult can be called in AM. - I sent a timed message to Dr. Augustin Coupe.

## 2022-07-06 NOTE — Telephone Encounter (Signed)
Patient called to see if Dr.Lowne could put a referral or order in for her to get fluids from the [facility] on [address]. She said that she was dehydrated and went to the hospital. She has HFU with Dr. Etter Sjogren this Friday but doesn't know if she should wait that long. Please call to advise.

## 2022-07-07 DIAGNOSIS — K219 Gastro-esophageal reflux disease without esophagitis: Secondary | ICD-10-CM | POA: Diagnosis present

## 2022-07-07 DIAGNOSIS — E860 Dehydration: Secondary | ICD-10-CM | POA: Diagnosis not present

## 2022-07-07 DIAGNOSIS — Z66 Do not resuscitate: Secondary | ICD-10-CM | POA: Diagnosis present

## 2022-07-07 DIAGNOSIS — Z79899 Other long term (current) drug therapy: Secondary | ICD-10-CM | POA: Diagnosis not present

## 2022-07-07 DIAGNOSIS — Z87410 Personal history of cervical dysplasia: Secondary | ICD-10-CM | POA: Diagnosis not present

## 2022-07-07 DIAGNOSIS — I951 Orthostatic hypotension: Secondary | ICD-10-CM | POA: Diagnosis present

## 2022-07-07 DIAGNOSIS — Z87891 Personal history of nicotine dependence: Secondary | ICD-10-CM | POA: Diagnosis not present

## 2022-07-07 DIAGNOSIS — K529 Noninfective gastroenteritis and colitis, unspecified: Secondary | ICD-10-CM | POA: Diagnosis not present

## 2022-07-07 DIAGNOSIS — K50819 Crohn's disease of both small and large intestine with unspecified complications: Secondary | ICD-10-CM | POA: Diagnosis not present

## 2022-07-07 DIAGNOSIS — Z8249 Family history of ischemic heart disease and other diseases of the circulatory system: Secondary | ICD-10-CM | POA: Diagnosis not present

## 2022-07-07 DIAGNOSIS — E669 Obesity, unspecified: Secondary | ICD-10-CM | POA: Diagnosis present

## 2022-07-07 DIAGNOSIS — N184 Chronic kidney disease, stage 4 (severe): Secondary | ICD-10-CM

## 2022-07-07 DIAGNOSIS — Z882 Allergy status to sulfonamides status: Secondary | ICD-10-CM | POA: Diagnosis not present

## 2022-07-07 DIAGNOSIS — F32A Depression, unspecified: Secondary | ICD-10-CM | POA: Diagnosis present

## 2022-07-07 DIAGNOSIS — N189 Chronic kidney disease, unspecified: Secondary | ICD-10-CM | POA: Diagnosis not present

## 2022-07-07 DIAGNOSIS — Z885 Allergy status to narcotic agent status: Secondary | ICD-10-CM | POA: Diagnosis not present

## 2022-07-07 DIAGNOSIS — K5080 Crohn's disease of both small and large intestine without complications: Secondary | ICD-10-CM | POA: Diagnosis present

## 2022-07-07 DIAGNOSIS — D649 Anemia, unspecified: Secondary | ICD-10-CM | POA: Diagnosis not present

## 2022-07-07 DIAGNOSIS — E872 Acidosis, unspecified: Secondary | ICD-10-CM | POA: Diagnosis present

## 2022-07-07 DIAGNOSIS — R531 Weakness: Secondary | ICD-10-CM | POA: Diagnosis not present

## 2022-07-07 DIAGNOSIS — N179 Acute kidney failure, unspecified: Secondary | ICD-10-CM | POA: Diagnosis not present

## 2022-07-07 DIAGNOSIS — K90822 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: Secondary | ICD-10-CM | POA: Diagnosis not present

## 2022-07-07 DIAGNOSIS — G2581 Restless legs syndrome: Secondary | ICD-10-CM | POA: Diagnosis present

## 2022-07-07 DIAGNOSIS — I129 Hypertensive chronic kidney disease with stage 1 through stage 4 chronic kidney disease, or unspecified chronic kidney disease: Secondary | ICD-10-CM | POA: Diagnosis not present

## 2022-07-07 DIAGNOSIS — E875 Hyperkalemia: Secondary | ICD-10-CM | POA: Diagnosis present

## 2022-07-07 DIAGNOSIS — E876 Hypokalemia: Secondary | ICD-10-CM | POA: Diagnosis not present

## 2022-07-07 DIAGNOSIS — E861 Hypovolemia: Secondary | ICD-10-CM | POA: Diagnosis present

## 2022-07-07 DIAGNOSIS — Z932 Ileostomy status: Secondary | ICD-10-CM | POA: Diagnosis not present

## 2022-07-07 DIAGNOSIS — E869 Volume depletion, unspecified: Secondary | ICD-10-CM | POA: Diagnosis not present

## 2022-07-07 DIAGNOSIS — E785 Hyperlipidemia, unspecified: Secondary | ICD-10-CM | POA: Diagnosis present

## 2022-07-07 DIAGNOSIS — E871 Hypo-osmolality and hyponatremia: Secondary | ICD-10-CM | POA: Diagnosis not present

## 2022-07-07 LAB — URINALYSIS, ROUTINE W REFLEX MICROSCOPIC
Bilirubin Urine: NEGATIVE
Glucose, UA: NEGATIVE mg/dL
Ketones, ur: NEGATIVE mg/dL
Leukocytes,Ua: NEGATIVE
Nitrite: NEGATIVE
Protein, ur: NEGATIVE mg/dL
Specific Gravity, Urine: 1.005 (ref 1.005–1.030)
pH: 5 (ref 5.0–8.0)

## 2022-07-07 LAB — CBC
HCT: 32 % — ABNORMAL LOW (ref 36.0–46.0)
Hemoglobin: 10.3 g/dL — ABNORMAL LOW (ref 12.0–15.0)
MCH: 33.4 pg (ref 26.0–34.0)
MCHC: 32.2 g/dL (ref 30.0–36.0)
MCV: 103.9 fL — ABNORMAL HIGH (ref 80.0–100.0)
Platelets: 317 10*3/uL (ref 150–400)
RBC: 3.08 MIL/uL — ABNORMAL LOW (ref 3.87–5.11)
RDW: 15.4 % (ref 11.5–15.5)
WBC: 21.4 10*3/uL — ABNORMAL HIGH (ref 4.0–10.5)
nRBC: 0 % (ref 0.0–0.2)

## 2022-07-07 LAB — BASIC METABOLIC PANEL
Anion gap: 11 (ref 5–15)
BUN: 86 mg/dL — ABNORMAL HIGH (ref 8–23)
CO2: 16 mmol/L — ABNORMAL LOW (ref 22–32)
Calcium: 8.3 mg/dL — ABNORMAL LOW (ref 8.9–10.3)
Chloride: 104 mmol/L (ref 98–111)
Creatinine, Ser: 5.66 mg/dL — ABNORMAL HIGH (ref 0.44–1.00)
GFR, Estimated: 8 mL/min — ABNORMAL LOW (ref >60)
Glucose, Bld: 104 mg/dL — ABNORMAL HIGH (ref 70–99)
Potassium: 4.5 mmol/L (ref 3.5–5.1)
Sodium: 131 mmol/L — ABNORMAL LOW (ref 135–145)

## 2022-07-07 LAB — GLUCOSE, CAPILLARY: Glucose-Capillary: 77 mg/dL (ref 70–99)

## 2022-07-07 LAB — PROCALCITONIN: Procalcitonin: 0.43 ng/mL

## 2022-07-07 LAB — HIV ANTIBODY (ROUTINE TESTING W REFLEX): HIV Screen 4th Generation wRfx: NONREACTIVE

## 2022-07-07 MED ORDER — SODIUM CHLORIDE 0.9 % IV BOLUS
2500.0000 mL | Freq: Once | INTRAVENOUS | Status: AC
  Administered 2022-07-07: 2500 mL via INTRAVENOUS

## 2022-07-07 MED ORDER — SODIUM CHLORIDE 0.9 % IV SOLN: INTRAVENOUS | Status: DC

## 2022-07-07 MED ORDER — STERILE WATER FOR INJECTION IV SOLN
INTRAVENOUS | Status: DC
  Filled 2022-07-07 (×2): qty 150
  Filled 2022-07-07: qty 1000
  Filled 2022-07-07: qty 150
  Filled 2022-07-07: qty 1000

## 2022-07-07 MED ORDER — LOPERAMIDE HCL 2 MG PO CAPS
4.0000 mg | ORAL_CAPSULE | Freq: Two times a day (BID) | ORAL | Status: DC
  Administered 2022-07-07 – 2022-07-08 (×3): 4 mg via ORAL
  Filled 2022-07-07 (×3): qty 2

## 2022-07-07 NOTE — TOC Initial Note (Signed)
Transition of Care [facility]) - Initial/Assessment Note    Patient Details  Name: [name] MRN: [id number] Date of Birth: [birth year]  Transition of Care [facility]) CM/SW Contact:    Leeroy Cha, RN Phone Number: 07/07/2022, 9:46 AM  Clinical Narrative:                  Transition of Care [facility]) Screening Note   Patient Details  Name: [name] Date of Birth: 03-Mar-1956   Transition of Care [facility]) CM/SW Contact:    Leeroy Cha, RN Phone Number: 07/07/2022, 9:46 AM    Transition of Care Department [facility]) has reviewed patient and no TOC needs have been identified at this time. We will continue to monitor patient advancement through interdisciplinary progression rounds. If new patient transition needs arise, please place a TOC consult.    Expected Discharge Plan: Home/Self Care Barriers to Discharge: Continued Medical Work up   Patient Goals and CMS Choice   CMS Medicare.gov Compare Post Acute Care list provided to:: Patient    Expected Discharge Plan and Services Expected Discharge Plan: Home/Self Care   Discharge Planning Services: CM Consult   Living arrangements for the past 2 months: Single Family Home                                      Prior Living Arrangements/Services Living arrangements for the past 2 months: Single Family Home Lives with:: Self Patient language and need for interpreter reviewed:: Yes Do you feel safe going back to the place where you live?: Yes            Criminal Activity/Legal Involvement Pertinent to Current Situation/Hospitalization: No - Comment as needed  Activities of Daily Living Home Assistive Devices/Equipment: Bedside commode/3-in-1 ADL Screening (condition at time of admission) Patient's cognitive ability adequate to safely complete daily activities?: Yes Is the patient deaf or have difficulty hearing?: No Does the patient have difficulty seeing, even when wearing glasses/contacts?: No Does the  patient have difficulty concentrating, remembering, or making decisions?: No Patient able to express need for assistance with ADLs?: Yes Does the patient have difficulty dressing or bathing?: No Independently performs ADLs?: Yes (appropriate for developmental age) Does the patient have difficulty walking or climbing stairs?: No Weakness of Legs: None Weakness of Arms/Hands: None  Permission Sought/Granted                  Emotional Assessment Appearance:: Appears stated age Attitude/Demeanor/Rapport: Engaged Affect (typically observed): Calm Orientation: : Oriented to Self, Oriented to Place, Oriented to  Time, Oriented to Situation Alcohol / Substance Use: Not Applicable Psych Involvement: No (comment)  Admission diagnosis:  Acute kidney injury superimposed on chronic kidney disease ([place]) [N17.9, N18.9] Acute renal failure superimposed on chronic kidney disease, unspecified acute renal failure type, unspecified CKD stage ([place]) [N17.9, N18.9] Patient Active Problem List   Diagnosis Date Noted   High anion gap metabolic acidosis 26/71/2458   GERD without esophagitis 07/06/2022   Orthostatic hypotension 07/06/2022   Ileostomy in place [facility]) 06/30/2022   Acute renal failure superimposed on stage 4 chronic kidney disease, unspecified acute renal failure type ([place]) 06/29/2022   Hypotension 06/29/2022   Sepsis secondary to UTI ([place]) 06/29/2022   Urinary frequency 03/05/2022   Neck pain 12/08/2021   Facial numbness 12/08/2021   Tooth abscess 11/10/2021   Surgical wound breakdown, initial encounter 05/27/2021  Crohn's disease of both small and large intestine ([place]) 05/20/2021   Acute kidney injury superimposed on chronic kidney disease ([place]) 05/14/2021   Thrombocytosis 05/14/2021   SIRS (systemic inflammatory response syndrome) ([place]) 05/14/2021   Hypochloremia 05/14/2021   Chronic pain of left knee 03/10/2021   Depression, major, single episode, severe ([place]) 12/18/2020    Hyperlipidemia associated with type 2 diabetes mellitus ([place]) 12/18/2020   Severe sepsis ([place]) 12/07/2020   Acute pyelonephritis 12/07/2020   Closed fracture of left tibial plateau 10/30/2020   Screening for osteoporosis 10/30/2020   Diarrhea with dehydration 09/21/2020   Crohn's disease of jejunum ([place]) 08/23/2020   Chronic diarrhea 40/97/3532   Metabolic acidosis 99/24/2683   ARF (acute renal failure) ([place]) 08/23/2020   Acute renal failure superimposed on stage 4 chronic kidney disease ([place]) 08/22/2020   Hyperglycemia 02/26/2020   Immunologic deficiency syndrome ([place]) 02/26/2020   Prothrombin gene mutation ([place]) 02/26/2020   Acute pain of left shoulder 06/04/2019   Pes anserine bursitis 04/12/2018   Pain of right thumb 04/12/2018   Low back pain 06/21/2017   Acute right-sided low back pain with right-sided sciatica 06/21/2017   Gout 06/21/2017   Superficial thrombophlebitis of right upper extremity 41/96/2229   Acute basilic vein thrombosis, right 02/16/2017   Symptomatic anemia 01/15/2017   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   Rectal pain 12/01/2015   IBD (inflammatory bowel disease) 10/24/2015   Anal stricture 10/24/2015   Gout of foot 10/29/2014   Ovarian cyst, right 06/12/2014   Acute on chronic renal failure ([place]) 04/02/2013   Hydrosalpinx 02/14/2013   Hyperparathyroidism ([place]) 09/18/2012   HSV (herpes simplex virus) anogenital infection 06/28/2012   Stage 4 chronic kidney disease ([place]) 06/17/2012   LLQ abdominal pain 06/22/2011   RESTLESS LEGS SYNDROME 08/05/2009   PULMONARY NODULE, RIGHT UPPER LOBE 02/28/2009   TOBACCO ABUSE, HX OF 02/11/2009   PAIN IN JOINT, MULTIPLE SITES 02/04/2009   Nonspecific (abnormal) findings on radiological and other examination of body structure 12/23/2008   CT, CHEST, ABNORMAL 12/23/2008   HEMANGIOMA SIMPLEX 12/16/2008   Pain in joint, pelvic region and thigh 12/16/2008   SKIN RASH 12/16/2008   TINEA CORPORIS 04/18/2008   LATERAL  EPICONDYLITIS, RIGHT 04/18/2008   Morbid obesity ([place]) 02/05/2008   ANEMIA 02/05/2008   ANAL STENOSIS 02/05/2008   Stool culture positive for Clostridioides difficile 02/05/2008   Personal history of other endocrine, metabolic, and immunity disorders 02/05/2008   Personal history of other diseases of digestive system 02/05/2008   SYNCOPE AND COLLAPSE 02/02/2008   Hyponatremia 12/15/2007   Dehydration 12/15/2007   HYPOKALEMIA 12/15/2007   ANEMIA-IRON DEFICIENCY 12/15/2007   LEUKOCYTOSIS 12/15/2007   Anxiety state 12/15/2007   Disorder resulting from impaired renal function 11/16/2007   TINNITUS NOS 05/25/2007   DE QUERVAIN'S TENOSYNOVITIS 05/25/2007   Hyperlipidemia LDL goal <100 10/23/2006   Depression 10/23/2006   Essential hypertension 10/23/2006   GERD 10/23/2006   Gastro-esophageal reflux disease without esophagitis 10/23/2006   Ulcerative colitis without complications ([place]) 79/89/2119   GASTRIC POLYP 11/16/2001   GERD with esophagitis 11/16/2001   PCP:  Ann Held, DO Pharmacy:   [id number] - [place] [address] Phone: [phone] Fax: [phone]  [id number] [place], [place] - [address] [facility] [address]: [phone]: [phone]    Social Determinants of Health (SDOH) Interventions    Readmission Risk Interventions  Row Labels 05/15/2021    1:42 PM 12/10/2020   10:57 AM  Readmission Risk Prevention Plan   Section Header. No data exists in this row.    Transportation Screening   Complete Complete  PCP or Specialist Appt within 3-5 Days    Complete  HRI or Home Care Consult   Complete Complete  Social Work Consult for [place] Planning/Counseling   Complete Complete  Palliative Care Screening   Not Applicable Not Applicable  Medication Review Press [employer])    Complete

## 2022-07-07 NOTE — Progress Notes (Signed)
[name], is a 66 y.o. female, DOB - 09-Apr-1956, YOV:[id number] Admit date - 07/06/2022    Outpatient Primary MD for the patient is Carollee Herter, Alferd Apa, DO  LOS - 0  days  Chief Complaint  Patient presents with   Loss of Consciousness       Brief summary   Patient is a 66 year old female with history of asthma, CKD stage IV-V, Crohn's disease status post ileostomy with high output, depression, GERD presented to ED with acute onset of generalized weakness and syncope.  Patient was getting from her bed to the bathroom and upon sitting on her commode, passed out and fell without injuries.  No nausea or vomiting or melena or bright red bleeding with her ileostomy. In ED, BP 97/69, heart rate 112.  Sodium 129, chloride 96, potassium 5.3, CO2 15, creatinine 6.49 compared to BUN 56/creatinine 3.6 on 07/01/2022.  Anion gap 18  She was admitted for further work-up.   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 -Presented with creatinine of 6.49, was 3.6 on 07/01/2022 baseline between 4-5, hyperkalemia, anion gap acidosis, anion gap 18, CO2 15 -Patient was placed on normal saline IV fluid hydration, bicarb slightly improved to 16, anion gap 11, creatinine improving to 5.6 -Continue IV fluid hydration, placed on bicarb drip  Active Problems:   Chronic diarrhea from high output ileostomy, history of Crohn's disease -Patient takes loperamide 4 mg daily, will change to 4 mg twice daily -If no significant improvement, will add Questran.  No abdominal pain, nausea or vomiting.     Orthostatic hypotension, syncope - secondary to dehydration - cont IV fluid hydration    Essential  hypertension -BP currently stable    GERD without esophagitis cont PPI  Obesity Estimated body mass index is 32.49 kg/m as calculated from the following:   Height as of this encounter: 5' 3"  (1.6 m).   Weight as of this encounter: 83.2 kg.  Code Status: Full code DVT Prophylaxis:  enoxaparin (LOVENOX) injection 30 mg Start: 07/07/22 1000   Level of Care: Level of care: Telemetry Family Communication: Updated patient, currently alert and oriented   Disposition Plan:      Remains inpatient appropriate: [place] to DC home in next 24 to 48 hours once the stools are formed and creatinine function closer to baseline   Procedures:  None  Consultants:   None  Antimicrobials: None  Medications  allopurinol  150 mg Oral QHS   diphenhydrAMINE  25 mg Oral QHS   DULoxetine  120 mg Oral Daily   enoxaparin (LOVENOX) injection  30 mg Subcutaneous X41O   folic acid  1 mg Oral Daily   loperamide  4 mg Oral BID   metoprolol succinate  100 mg Oral Daily   metoprolol succinate  50 mg Oral QHS  simethicone  120 mg Oral QHS   Vitamin D (Ergocalciferol)  50,000 Units Oral Q30 days      Subjective:   [name] was seen and examined today.  No abdominal pain, fevers or chills.  Still having watery stools.  Patient denies dizziness, chest pain, shortness of breath, abdominal pain, N/V.  Objective:   Vitals:   07/06/22 2245 07/06/22 2330 07/07/22 0024 07/07/22 0346  BP: 116/88 126/80  139/78  Pulse: 96 100  95  Resp:  18  20  Temp:   98.2 F (36.8 C) 97.8 F (36.6 C)  TempSrc:    Oral  SpO2: 100% 97%  100%  Weight:    83.2 kg  Height:    5' 3"  (1.6 m)    Intake/Output Summary (Last 24 hours) at 07/07/2022 1127 Last data filed at 07/07/2022 0700 Gross per 24 hour  Intake 595.4 ml  Output 200 ml  Net 395.4 ml     Wt Readings from Last 3 Encounters:  07/07/22 83.2 kg  06/29/22 83.8 kg  03/10/22 80.8 kg     Exam General: Alert and oriented x 3, NAD Cardiovascular: S1  S2 auscultated,  RRR Respiratory: Clear to auscultation bilaterally, no wheezing Gastrointestinal: Soft, nontender, nondistended, + bowel sounds, ileostomy with watery stool Ext: no pedal edema bilaterally Neuro: no new FND's Psych: Normal affect and demeanor, alert and oriented x3     Data Reviewed:  I have personally reviewed following labs    CBC Lab Results  Component Value Date   WBC 21.4 (H) 07/07/2022   RBC 3.08 (L) 07/07/2022   HGB 10.3 (L) 07/07/2022   HCT 32.0 (L) 07/07/2022   MCV 103.9 (H) 07/07/2022   MCH 33.4 07/07/2022   PLT 317 07/07/2022   MCHC 32.2 07/07/2022   RDW 15.4 07/07/2022   LYMPHSABS 3.0 06/29/2022   MONOABS 1.2 (H) 06/29/2022   EOSABS 1.3 (H) 06/29/2022   BASOSABS 0.1 20/94/7096     Last metabolic panel Lab Results  Component Value Date   NA 131 (L) 07/07/2022   K 4.5 07/07/2022   CL 104 07/07/2022   CO2 16 (L) 07/07/2022   BUN 86 (H) 07/07/2022   CREATININE 5.66 (H) 07/07/2022   GLUCOSE 104 (H) 07/07/2022   GFRNONAA 8 (L) 07/07/2022   GFRAA 24 (L) 08/16/2018   CALCIUM 8.3 (L) 07/07/2022   PHOS 4.3 12/18/2020   PROT 6.5 07/01/2022   ALBUMIN 2.9 (L) 07/01/2022   BILITOT 0.2 (L) 07/01/2022   ALKPHOS 90 07/01/2022   AST 23 07/01/2022   ALT 22 07/01/2022   ANIONGAP 11 07/07/2022    CBG (last 3)  Recent Labs    07/06/22 1805 07/07/22 0731  GLUCAP 131* 77      Coagulation Profile: No results for input(s): "INR", "PROTIME" in the last 168 hours.   Radiology Studies: I have personally reviewed the imaging studies  No results found.     Estill Cotta M.D. [facility] 07/07/2022, 11:27 AM  Available via Epic secure chat 7am-7pm After 7 pm, please refer to night coverage provider listed on amion.

## 2022-07-07 NOTE — Consult Note (Signed)
Renal Service Consult Note [facility]  [name] 07/07/2022 Jeanette Blazing, [name] Requesting Physician: Jeanette [name]    Reason for Consult: Renal faliure HPI: The patient is a 66 y.o. year-old w/ PMH as below who presented to ED last night for gen'd weakness, multiple syncopal episodes (on standing), feeling dehydrated. 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 Pt denied n/v/d. In ED BP was 97/69 and Na 129, BUN 83, creat 6.49. CXR negative. Pt given 1L NS and started on IVF's and admitted. We are asked to see for renal faliure.   Pt seen in room. States she has rec'd "several bags" of fluid and is feeling a lot better, was able to walk in the room today without "passing out". 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 Water she drinks "come right out in the bag". For 6-12 mos last year she was assigned to IVF"s in the outpt clinic 2-3 times per week and she say that really helped. She has CKD f/b [name] and is likely to "end up on dialysis", she was told.  ROS - denies CP, no joint pain, no HA, no blurry vision, no rash, no diarrhea, no nausea/ vomiting, no dysuria, no difficulty voiding   Past Medical History  Past Medical History:  Diagnosis Date   Abdominal pain    Hx   Allergy    Anal stenosis    Anemia    Anxiety    Arthritis    Asthma    patient does not have inhaler   Blood in stool    Hx   Blood in urine    Hx   Blood transfusion without reported diagnosis    Cataract    CKD (chronic kidney disease) stage 3, GFR 30-59 ml/min (HCC)    Crohn's colitis, other complication (HCC)    De Quervain's tenosynovitis    Depression    Difficulty urinating    Hx   Easy bruising    Esophagitis    Fainting    History - resolved - due to dehydration   Fatigue    Hx   Fibroid    Hx   Gastric polyp    GERD (gastroesophageal reflux disease)    Hearing loss     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   Hemorrhoids, external    Hemorrhoids, internal    Herpes, genital    vaginal treated 07/05/12 and pt states is resolved   History of cervical dysplasia    History of small bowel obstruction    Hyperlipidemia    currently no meds   Hyperparathyroidism    Hypertension    Hypokalemia    Hx   IBD (inflammatory bowel disease)    initially colectomy for suspected UC, 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   Obesity    Ovarian cyst    Poor dental hygiene    Pulmonary nodule, right    right upper lobe   Rectal bleeding    Hx   Rectal pain    Hx   Renal insufficiency    CKD - stage 3   RLS (restless legs syndrome)    no meds   Tooth infection 11/2016   right low   Ulcerative colitis    Visual disturbance    wears glasses   Weakness generalized    Hx - patient denies generalized weakness  Wears dentures    upper only   Past Surgical History  Past Surgical History:  Procedure Laterality Date   ANAL DILATION     CERVICAL BIOPSY  W/ LOOP ELECTRODE EXCISION     CHOLECYSTECTOMY     COLONOSCOPY     [name]   fatty tumor removed from back     X 2   HEMORRHOID SURGERY     ILEOSTOMY CLOSURE     RESTORATIVE PROCTOCOLECTOMY     with insertion of ileoanal J Pouch with loop ileostomy   SHOULDER ARTHROSCOPY WITH CAPSULORRHAPHY Left 06/14/2019   Procedure: LEFT SHOULDER ARTHRSCOPIC REPAIR OF BONY BANKART FRACTURE;  Surgeon: [name];  Location: [facility];  Service: Orthopedics;  Laterality: Left;  REQUEST 17 MINUTE   SIGMOIDOSCOPY     TOTAL ABDOMINAL HYSTERECTOMY  1998   TAH/LSO   TUBAL LIGATION     UPPER GASTROINTESTINAL ENDOSCOPY     [name]   Family History  Family History  Problem Relation Age of Onset   Ulcerative colitis Father    Hypertension Father    Heart attack Father    Hypertension Mother    Heart disease Mother        s/p pci   Ulcerative colitis Daughter    Irritable bowel syndrome Other         grandchildren   Diabetes Sister    Cancer Sister        uterine   Cancer Maternal Uncle        LUNG   Colon cancer Neg Hx    Esophageal cancer Neg Hx    Stomach cancer Neg Hx    Rectal cancer Neg Hx    Social History  reports that she quit smoking about 43 years ago. Her smoking use included cigarettes. She has a 4.00 pack-year smoking history. She has never used smokeless tobacco. She reports that she does not drink alcohol and does not use drugs. Allergies  Allergies  Allergen Reactions   Sulfa Antibiotics Hives, Itching, Rash and Swelling   Morphine Other (See Comments)    "Gives me crazy dreams" (delusions, also)   Sulfonamide Derivatives Hives, Itching and Swelling   Home medications Prior to Admission medications   Medication Sig Start Date End Date Taking? Authorizing Provider  acetaminophen (TYLENOL) 500 MG tablet Take 1,000 mg by mouth at bedtime as needed (for pain/sleep).   Yes Provider, Historical, [name]  allopurinol (ZYLOPRIM) 300 MG tablet TAKE ONE TABLET BY MOUTH DAILY AT 9AM Patient taking differently: Take 150 mg by mouth at bedtime. 07/05/22  Yes Jeanette Schanz R, DO  amLODipine (NORVASC) 5 MG tablet Take 5 mg by mouth daily. 05/20/22  Yes Provider, Historical, [name]  Calcium Carbonate Antacid (TUMS EXTRA STRENGTH 750 PO) Take 2 tablets by mouth daily as needed (heartburn).   Yes Provider, Historical, [name]  diphenhydrAMINE (BENADRYL ALLERGY) 25 MG tablet Take 25 mg by mouth at bedtime.   Yes Provider, Historical, [name]  DULoxetine (CYMBALTA) 60 MG capsule TAKE TWO CAPSULES (120MG) BY MOUTH DAILY AT 9AM Patient taking differently: Take 120 mg by mouth daily. 07/05/22  Yes Jeanette Held, DO  folic acid (FOLVITE) 1 MG tablet Take 2 tablets (2 mg total) by mouth daily. Patient taking differently: Take 1 mg by mouth daily. 05/26/17  Yes [name], Jeanette Cobb, [name]  GAS-X EXTRA STRENGTH 125 MG CAPS Take 125 mg by mouth at bedtime.   Yes Provider, Historical, [name]  loperamide  (IMODIUM A-D) 2 MG tablet  Take 4 mg by mouth at bedtime.   Yes Provider, Historical, [name]  metoprolol succinate (TOPROL-XL) 100 MG 24 hr tablet Take 50-100 mg by mouth See admin instructions. Take 100 mg by mouth in the morning and 50 mg at bedtime   Yes Provider, Historical, [name]  OVER THE COUNTER MEDICATION Take 1 tablet by mouth See admin instructions. Nervive Nerve Health tablet- Take 1 tablet by mouth at bedtime   Yes Provider, Historical, [name]  Vitamin D, Ergocalciferol, (DRISDOL) 1.25 MG (50000 UNIT) CAPS capsule Take 50,000 Units by mouth every 30 (thirty) days.   Yes Provider, Historical, [name]  famotidine (PEPCID) 20 MG tablet TAKE 1 TABLET BY MOUTH TWICE A DAY Patient not taking: Reported on 07/06/2022 04/01/21   Yetta Flock, [name]     Vitals:   07/07/22 0024 07/07/22 0346 07/07/22 1210 07/07/22 2045  BP:  139/78 131/85 (!) 172/91  Pulse:  95 (!) 102 97  Resp:  _0 Temp: 98.2 F (36.8 C) 97.8 F (36.6 C) 98.3 F (36.8 C) 98.6 F (37 C)  TempSrc:  Oral Oral Oral  SpO2:  100% 100% 97%  Weight:  83.2 kg    Height:  _1  (1.6 m)     Exam Gen alert, no distress No rash, cyanosis or gangrene Sclera anicteric, throat clear  No jvd or bruits Chest clear bilat to bases, no rales/ wheezing RRR no MRG Abd soft ntnd ileostomy bag is present GU defer MS no joint effusions or deformity Ext no LE or UE edema, no wounds or ulcers Neuro is alert, Ox 3 , nf    Home meds include - allopurinol, norvasc, duloxetine, toprol xl 50 hs, vits/ prns    Date  Creat  eGFR   Jan 2022 6.61 >> 1.75   Feb 2022 1.9- 2.14   April   2.1- 2.09 Apr 2021 3.54   Sept   4.94 >> 2.46   Oct 2022 4.13 Sep 2021 2.55  20 ml/min   April 2023 3.22     03/05/22 2.93   03/2022 3.1- 3.9   04/2022 3.4  15 ml/min   05/11/22 3.22   Oct 24-26 5.3 >> 3.60   10/31  6.49    07/07/22 5.66      UA negative    Na 131 K 4.5 CO2 16 BUN 86  creat 5.6    Alb 2.9  WBC 21K  Hb 10   IV rocephin    CT 06/29/22  - Adrenals/Urinary Tract: Adrenal glands are unremarkable. Kidneys are normal, without renal calculi, focal lesion, or hydronephrosis. Bladder is unremarkable.   Assessment/ Plan: AKI on CKD IV - b/l creat 3.4 from august 2023, eGFR 15 ml/min.  Creat here 6.49 in setting of sig dehydration w/ falling and syncopal episodes. Pt has shortgut and has long hx of recurrent dehydration. She may need outpatient saline infusions. Got 1 L bolus in ED and getting IVF's. Feels a lot better. Will rebolus 2.5 L and f/u labs in the morning. No indication for RRT. Will follow.  Crohn's disease / sp colectomy w/ ileostomy  Hx of shortgut syndrome HTN - bp meds on hold for now    Kelly Splinter  [name] 07/07/2022, 9:48 PM Recent Labs  Lab 07/01/22 0356 07/06/22 1933 07/07/22 0345  HGB 9.9* 12.9 10.3*  ALBUMIN 2.9*  --   --   CALCIUM 9.0 10.0 8.3*  CREATININE 3.60* 6.49* 5.66*  K 4.0 5.3* 4.5  Inpatient medications:  allopurinol  150 mg Oral QHS   diphenhydrAMINE  25 mg Oral QHS   DULoxetine  120 mg Oral Daily   enoxaparin (LOVENOX) injection  30 mg Subcutaneous P84K   folic acid  1 mg Oral Daily   loperamide  4 mg Oral BID   metoprolol succinate  100 mg Oral Daily   metoprolol succinate  50 mg Oral QHS   simethicone  120 mg Oral QHS   Vitamin D (Ergocalciferol)  50,000 Units Oral Q30 days    sodium bicarbonate 150 mEq in sterile water 1,150 mL infusion Stopped (07/07/22 2051)   acetaminophen **OR** acetaminophen, calcium carbonate, magnesium hydroxide, ondansetron **OR** ondansetron (ZOFRAN) IV, traZODone

## 2022-07-07 NOTE — Telephone Encounter (Signed)
Pt currently admitted to the hospital

## 2022-07-07 NOTE — ED Notes (Signed)
ED TO INPATIENT HANDOFF REPORT  ED Nurse Name and Phone #: Tonette Bihari [phone]  S Name/Age/Gender [name] 66 y.o. female Room/Bed: WA15/WA15  Code Status   Code Status: DNR  Home/SNF/Other Home Patient oriented to: self, place, time, and situation Is this baseline? Yes   Triage Complete: Triage complete  Chief Complaint Acute kidney injury superimposed on chronic kidney disease ([place]) [N17.9, N18.9]  Triage Note Patient recently discharged from the hospital with a UTI on Thursday. Has been feeling terrible ever since. Has had multiple falls after losing consciousness. Weak and dehydrated feeling.    Allergies Allergies  Allergen Reactions   Sulfa Antibiotics Hives, Itching, Rash and Swelling   Morphine Other (See Comments)    "Gives me crazy dreams" (delusions, also)   Sulfonamide Derivatives Hives, Itching and Swelling    Level of Care/Admitting Diagnosis ED Disposition     ED Disposition  Admit   Condition  --   Comment  Hospital Area: [facility] [[id number]]  Level of Care: Telemetry [5]  Admit to tele based on following criteria: Eval of Syncope  May place patient in observation at [facility] or [facility] [place] if equivalent level of care is available:: No  Covid Evaluation: Asymptomatic - no recent exposure (last 10 days) testing not required  Diagnosis: Acute kidney injury superimposed on chronic kidney disease [facility]) [[id number]]  Admitting Physician: Christel Mormon [[id number]]  Attending Physician: Christel Mormon [[id number]]          B Medical/Surgery History Past Medical History:  Diagnosis Date   Abdominal pain    Hx   Allergy    Anal stenosis    Anemia    Anxiety    Arthritis    Asthma    patient does not have inhaler   Blood in stool    Hx   Blood in urine    Hx   Blood transfusion without reported diagnosis    Cataract    CKD (chronic kidney disease) stage 3, GFR 30-59 ml/min (HCC)    Crohn's colitis, other complication ([place])     De Quervain's tenosynovitis    Depression    Difficulty urinating    Hx   Easy bruising    Esophagitis    Fainting    History - resolved - due to dehydration   Fatigue    Hx   Fibroid    Hx   Gastric polyp    GERD (gastroesophageal reflux disease)    Hearing loss    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   Hemorrhoids, external    Hemorrhoids, internal    Herpes, genital    vaginal treated 07/05/12 and pt states is resolved   History of cervical dysplasia    History of small bowel obstruction    Hyperlipidemia    currently no meds   Hyperparathyroidism    Hypertension    Hypokalemia    Hx   IBD (inflammatory bowel disease)    initially colectomy for suspected UC, 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   Obesity    Ovarian cyst    Poor dental hygiene    Pulmonary nodule, [name]    [name] upper lobe   Rectal bleeding    Hx   Rectal pain    Hx   Renal insufficiency    CKD - stage 3   RLS (restless legs syndrome)    no meds   Tooth infection 11/2016  [name] low   Ulcerative colitis    Visual disturbance    wears glasses   Weakness generalized    Hx - patient denies generalized weakness   Wears dentures    upper only   Past Surgical History:  Procedure Laterality Date   ANAL DILATION     CERVICAL BIOPSY  W/ LOOP ELECTRODE EXCISION     CHOLECYSTECTOMY     COLONOSCOPY     [name]   fatty tumor removed from back     X 2   HEMORRHOID SURGERY     ILEOSTOMY CLOSURE     RESTORATIVE PROCTOCOLECTOMY     with insertion of ileoanal J Pouch with loop ileostomy   SHOULDER ARTHROSCOPY WITH CAPSULORRHAPHY Left 06/14/2019   Procedure: LEFT SHOULDER ARTHRSCOPIC REPAIR OF BONY BANKART FRACTURE;  Surgeon: Tania Ade, MD;  Location: [facility];  Service: Orthopedics;  Laterality: Left;  REQUEST 2 MINUTE   SIGMOIDOSCOPY     TOTAL ABDOMINAL HYSTERECTOMY  1998   TAH/LSO   TUBAL LIGATION     UPPER GASTROINTESTINAL ENDOSCOPY     [name]     A IV  Location/Drains/Wounds Patient Lines/Drains/Airways Status     Active Line/Drains/Airways     Name Placement date Placement time Site Days   Peripheral IV 07/01/22 20 G 1" Anterior;Left Forearm 07/01/22  1122  Forearm  6   Peripheral IV 07/06/22 20 G [name] Antecubital 07/06/22  1936  Antecubital  1   Ileostomy Standard (end) RLQ 06/30/22  1013  RLQ  7            Intake/Output Last 24 hours No intake or output data in the 24 hours ending 07/07/22 0243  Labs/Imaging Results for orders placed or performed during the hospital encounter of 07/06/22 (from the past 48 hour(s))  CBG monitoring, ED     Status: Abnormal   Collection Time: 07/06/22  6:05 PM  Result Value Ref Range   Glucose-Capillary 131 (H) 70 - 99 mg/dL    Comment: Glucose reference range applies only to samples taken after fasting for at least 8 hours.  Basic metabolic panel     Status: Abnormal   Collection Time: 07/06/22  7:33 PM  Result Value Ref Range   Sodium 129 (L) 135 - 145 mmol/L   Potassium 5.3 (H) 3.5 - 5.1 mmol/L   Chloride 96 (L) 98 - 111 mmol/L   CO2 15 (L) 22 - 32 mmol/L   Glucose, Bld 126 (H) 70 - 99 mg/dL    Comment: Glucose reference range applies only to samples taken after fasting for at least 8 hours.   BUN 83 (H) 8 - 23 mg/dL   Creatinine, Ser 6.49 (H) 0.44 - 1.00 mg/dL   Calcium 10.0 8.9 - 10.3 mg/dL   GFR, Estimated 7 (L) >60 mL/min    Comment: (NOTE) Calculated using the CKD-EPI Creatinine Equation (2021)    Anion gap 18 (H) 5 - 15    Comment: Performed at [facility], [place] [address], [place], [place] [address]  CBC     Status: Abnormal   Collection Time: 07/06/22  7:33 PM  Result Value Ref Range   WBC 23.6 (H) 4.0 - 10.5 K/uL   RBC 3.85 (L) 3.87 - 5.11 MIL/uL   Hemoglobin 12.9 12.0 - 15.0 g/dL   HCT 39.5 36.0 - 46.0 %   MCV 102.6 (H) 80.0 - 100.0 fL   MCH 33.5 26.0 - 34.0 pg   MCHC 32.7 30.0 - 36.0  g/dL   RDW 15.6 (H) 11.5 - 15.5 %   Platelets 402 (H) 150 -  400 K/uL   nRBC 0.0 0.0 - 0.2 %    Comment: Performed at [facility], [place] [address] [place], [place] [address]  Urinalysis, Routine w reflex microscopic     Status: Abnormal   Collection Time: 07/07/22 12:38 AM  Result Value Ref Range   Color, Urine STRAW (A) YELLOW   APPearance CLEAR CLEAR   Specific Gravity, Urine 1.005 1.005 - 1.030   pH 5.0 5.0 - 8.0   Glucose, UA NEGATIVE NEGATIVE mg/dL   Hgb urine dipstick SMALL (A) NEGATIVE   Bilirubin Urine NEGATIVE NEGATIVE   Ketones, ur NEGATIVE NEGATIVE mg/dL   Protein, ur NEGATIVE NEGATIVE mg/dL   Nitrite NEGATIVE NEGATIVE   Leukocytes,Ua NEGATIVE NEGATIVE   RBC / HPF 0-5 0 - 5 RBC/hpf   WBC, UA 0-5 0 - 5 WBC/hpf   Bacteria, UA RARE (A) NONE SEEN   Squamous Epithelial / LPF 0-5 0 - 5    Comment: Performed at [facility], [place] [address], [place], [place] [address]   *Note: Due to a large number of results and/or encounters for the requested time period, some results have not been displayed. A complete set of results can be found in Results Review.   No results found.  Pending Labs Unresulted Labs (From admission, onward)     Start     Ordered   07/07/22 2831  Basic metabolic panel  Daily at 5am,   R      07/06/22 2224   07/07/22 0500  CBC  Tomorrow morning,   R        07/06/22 2224   07/06/22 2214  HIV Antibody (routine testing w rflx)  (HIV Antibody (Routine testing w reflex) panel)  Once,   R        07/06/22 2224            Vitals/Pain Today's Vitals   07/06/22 2230 07/06/22 2245 07/06/22 2330 07/07/22 0024  BP: 122/80 116/88 126/80   Pulse: 95 96 100   Resp:   18   Temp:    98.2 F (36.8 C)  TempSrc:      SpO2: 100% 100% 97%   PainSc:        Isolation Precautions No active isolations  Medications Medications  allopurinol (ZYLOPRIM) tablet 150 mg (150 mg Oral Given 07/06/22 2308)  metoprolol succinate (TOPROL-XL) 24 hr tablet 50 mg (50 mg Oral Given 07/06/22 2310)   DULoxetine (CYMBALTA) DR capsule 120 mg (has no administration in time range)  calcium carbonate (TUMS - dosed in mg elemental calcium) chewable tablet 750 mg (has no administration in time range)  simethicone (MYLICON) chewable tablet 120 mg (120 mg Oral Given 07/06/22 2309)  loperamide (IMODIUM) capsule 4 mg (4 mg Oral Given 51/76/16 0737)  folic acid (FOLVITE) tablet 1 mg (has no administration in time range)  Vitamin D (Ergocalciferol) (DRISDOL) 1.25 MG (50000 UNIT) capsule 50,000 Units (has no administration in time range)  diphenhydrAMINE (BENADRYL) capsule 25 mg (25 mg Oral Given 07/06/22 2309)  enoxaparin (LOVENOX) injection 30 mg (has no administration in time range)  0.9 %  sodium chloride infusion ( Intravenous New Bag/Given 07/06/22 2312)  acetaminophen (TYLENOL) tablet 650 mg (has no administration in time range)    Or  acetaminophen (TYLENOL) suppository 650 mg (has no administration in time range)  traZODone (DESYREL) tablet 25 mg (has no administration  in time range)  magnesium hydroxide (MILK OF MAGNESIA) suspension 30 mL (has no administration in time range)  ondansetron (ZOFRAN) tablet 4 mg (has no administration in time range)    Or  ondansetron (ZOFRAN) injection 4 mg (has no administration in time range)  metoprolol succinate (TOPROL-XL) 24 hr tablet 100 mg (has no administration in time range)  sodium chloride 0.9 % bolus 1,000 mL (1,000 mLs Intravenous Bolus 07/06/22 2047)  0.9 %  sodium chloride infusion ( Intravenous New Bag/Given 07/06/22 2047)    Mobility walks High fall risk   Focused Assessments    R Recommendations: See Admitting Provider Note  Report given to:   Additional Notes:

## 2022-07-08 DIAGNOSIS — K90822 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: Secondary | ICD-10-CM | POA: Diagnosis not present

## 2022-07-08 DIAGNOSIS — N179 Acute kidney failure, unspecified: Secondary | ICD-10-CM | POA: Diagnosis not present

## 2022-07-08 DIAGNOSIS — N189 Chronic kidney disease, unspecified: Secondary | ICD-10-CM | POA: Diagnosis not present

## 2022-07-08 LAB — BASIC METABOLIC PANEL
Anion gap: 11 (ref 5–15)
BUN: 69 mg/dL — ABNORMAL HIGH (ref 8–23)
CO2: 18 mmol/L — ABNORMAL LOW (ref 22–32)
Calcium: 7.6 mg/dL — ABNORMAL LOW (ref 8.9–10.3)
Chloride: 110 mmol/L (ref 98–111)
Creatinine, Ser: 4.1 mg/dL — ABNORMAL HIGH (ref 0.44–1.00)
GFR, Estimated: 11 mL/min — ABNORMAL LOW (ref >60)
Glucose, Bld: 87 mg/dL (ref 70–99)
Potassium: 3.7 mmol/L (ref 3.5–5.1)
Sodium: 139 mmol/L (ref 135–145)

## 2022-07-08 LAB — CBC
HCT: 30 % — ABNORMAL LOW (ref 36.0–46.0)
Hemoglobin: 9.9 g/dL — ABNORMAL LOW (ref 12.0–15.0)
MCH: 34.1 pg — ABNORMAL HIGH (ref 26.0–34.0)
MCHC: 33 g/dL (ref 30.0–36.0)
MCV: 103.4 fL — ABNORMAL HIGH (ref 80.0–100.0)
Platelets: 304 10*3/uL (ref 150–400)
RBC: 2.9 MIL/uL — ABNORMAL LOW (ref 3.87–5.11)
RDW: 15.6 % — ABNORMAL HIGH (ref 11.5–15.5)
WBC: 17.4 10*3/uL — ABNORMAL HIGH (ref 4.0–10.5)
nRBC: 0 % (ref 0.0–0.2)

## 2022-07-08 LAB — C DIFFICILE QUICK SCREEN W PCR REFLEX
C Diff antigen: NEGATIVE
C Diff interpretation: NOT DETECTED
C Diff toxin: NEGATIVE

## 2022-07-08 MED ORDER — LOPERAMIDE HCL 2 MG PO CAPS
4.0000 mg | ORAL_CAPSULE | Freq: Three times a day (TID) | ORAL | Status: DC
  Administered 2022-07-08 – 2022-07-12 (×12): 4 mg via ORAL
  Filled 2022-07-08 (×12): qty 2

## 2022-07-08 NOTE — Progress Notes (Signed)
[place], is a 66 y.o. female, DOB - 1956/02/08, HKV:[id number] Admit date - 07/06/2022    Outpatient Primary MD for the patient is Carollee Herter, Alferd Apa, DO  LOS - 1  days  Chief Complaint  Patient presents with   Loss of Consciousness       Brief summary   Patient is a 65 year old female with history of asthma, CKD stage IV-V, Crohn's disease status post ileostomy with high output, depression, GERD presented to ED with acute onset of generalized weakness and syncope.  Patient was getting from her bed to the bathroom and upon sitting on her commode, passed out and fell without injuries.  No nausea or vomiting or melena or bright red bleeding with her ileostomy. In ED, BP 97/69, heart rate 112.  Sodium 129, chloride 96, potassium 5.3, CO2 15, creatinine 6.49 compared to BUN 56/creatinine 3.6 on 07/01/2022.  Anion gap 18  She was admitted for further work-up.   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      Orthostatic hypotension, syncope - secondary to dehydration - cont IV fluid hydration    Essential hypertension -BP currently stable    GERD without esophagitis cont PPI  Obesity Estimated body mass index is 32.49 kg/m as  calculated from the following:   Height as of this encounter: 5' 3"  (1.6 m).   Weight as of this encounter: 83.2 kg.  Code Status: Full code DVT Prophylaxis:  enoxaparin (LOVENOX) injection 30 mg Start: 07/07/22 1000   Level of Care: Level of care: Telemetry Family Communication: Updated patient, currently alert and oriented   Disposition Plan:      Remains inpatient appropriate: Hope to DC home in next 24 to 48 hours once the stools are formed and creatinine function closer to baseline.    Procedures:  None  Consultants:   Nephrology  Antimicrobials: None  Medications  allopurinol  150 mg Oral QHS   diphenhydrAMINE  25 mg Oral QHS   DULoxetine  120 mg Oral Daily   enoxaparin (LOVENOX) injection  30 mg Subcutaneous F64P   folic acid  1 mg Oral Daily   loperamide  4 mg Oral TID   metoprolol succinate  100 mg Oral Daily   metoprolol succinate  50 mg Oral QHS   simethicone  120 mg Oral QHS   Vitamin D (Ergocalciferol)  50,000 Units Oral Q30 days  Subjective:   [name] was seen and examined today.  Stools are still watery and loose.  No abdominal pain, fevers or chills.  Patient denies dizziness, chest pain, shortness of breath, nausea vomiting  Objective:   Vitals:   07/07/22 0346 07/07/22 1210 07/07/22 2045 07/08/22 0455  BP: 139/78 131/85 (!) 172/91 127/74  Pulse: 95 (!) 102 97 95  Resp: 20 18 16 17   Temp: 97.8 F (36.6 C) 98.3 F (36.8 C) 98.6 F (37 C) 98.2 F (36.8 C)  TempSrc: Oral Oral Oral Oral  SpO2: 100% 100% 97% 97%  Weight: 83.2 kg     Height: 5' 3"  (1.6 m)       Intake/Output Summary (Last 24 hours) at 07/08/2022 1030 Last data filed at 07/08/2022 0506 Gross per 24 hour  Intake 5098.77 ml  Output --  Net 5098.77 ml     Wt Readings from Last 3 Encounters:  07/07/22 83.2 kg  06/29/22 83.8 kg  03/10/22 80.8 kg   Physical Exam General: Alert and oriented x 3, NAD Cardiovascular: S1 S2 clear, RRR.  Respiratory: CTAB, no wheezing,  rales or rhonchi Gastrointestinal: Soft, nontender, nondistended, NBS, ileostomy with watery stool Ext: no pedal edema bilaterally Neuro: no new deficits Psych: Normal affect    Data Reviewed:  I have personally reviewed following labs    CBC Lab Results  Component Value Date   WBC 17.4 (H) 07/08/2022   RBC 2.90 (L) 07/08/2022   HGB 9.9 (L) 07/08/2022   HCT 30.0 (L) 07/08/2022   MCV 103.4 (H) 07/08/2022   MCH 34.1 (H) 07/08/2022   PLT 304 07/08/2022   MCHC 33.0 07/08/2022   RDW 15.6 (H) 07/08/2022   LYMPHSABS 3.0 06/29/2022   MONOABS 1.2 (H) 06/29/2022   EOSABS 1.3 (H) 06/29/2022   BASOSABS 0.1 16/06/9603     Last metabolic panel Lab Results  Component Value Date   NA 139 07/08/2022   K 3.7 07/08/2022   CL 110 07/08/2022   CO2 18 (L) 07/08/2022   BUN 69 (H) 07/08/2022   CREATININE 4.10 (H) 07/08/2022   GLUCOSE 87 07/08/2022   GFRNONAA 11 (L) 07/08/2022   GFRAA 24 (L) 08/16/2018   CALCIUM 7.6 (L) 07/08/2022   PHOS 4.3 12/18/2020   PROT 6.5 07/01/2022   ALBUMIN 2.9 (L) 07/01/2022   BILITOT 0.2 (L) 07/01/2022   ALKPHOS 90 07/01/2022   AST 23 07/01/2022   ALT 22 07/01/2022   ANIONGAP 11 07/08/2022    CBG (last 3)  Recent Labs    07/06/22 1805 07/07/22 0731  GLUCAP 131* 77      Coagulation Profile: No results for input(s): "INR", "PROTIME" in the last 168 hours.   Radiology Studies: I have personally reviewed the imaging studies  No results found.     Estill Cotta M.D. [facility] 07/08/2022, 10:30 AM  Available via Epic secure chat 7am-7pm After 7 pm, please refer to night coverage provider listed on amion.

## 2022-07-08 NOTE — Progress Notes (Signed)
Clarified IVF order with provider. Per notes, review of nephrology note, and orders, provider advises that both sodium bicarb and NSR should be given as notes and order does not specify one over the other. Continuous mention of hydration and kidney health.

## 2022-07-08 NOTE — Progress Notes (Signed)
[facility] Progress Note  Subjective: voiding "all night", not recorded though. No new c/o's.   Vitals:   07/07/22 0346 07/07/22 1210 07/07/22 2045 07/08/22 0455  BP: 139/78 131/85 (!) 172/91 127/74  Pulse: 95 (!) 102 97 95  Resp: _0 Temp: 97.8 F (36.6 C) 98.3 F (36.8 C) 98.6 F (37 C) 98.2 F (36.8 C)  TempSrc: Oral Oral Oral Oral  SpO2: 100% 100% 97% 97%  Weight: 83.2 kg     Height: _1  (1.6 m)       Exam: Gen alert, no distress No jvd or bruits Chest clear bilat to bases RRR no MRG Abd soft ntnd ileostomy bag is present Ext no LE or UE edema Neuro is alert, Ox 3 , nf      Home meds include - allopurinol, norvasc, duloxetine, toprol xl 50 hs, vits/ prns     Date               Creat               eGFR   Jan 2022       6.61 >> 1.75   Feb 2022       1.9- 2.14   April               2.1- 2.09 Apr 2021       3.54   Sept               4.94 >> 2.46   Oct 2022       4.13 Sep 2021       2.55                 20 ml/min   April 2023      3.22                    03/05/22          2.93   03/2022           3.1- 3.9   04/2022           3.4                   15 ml/min   05/11/22          3.22   Oct 24-26      5.3 >> 3.60   10/31             6.49    07/07/22       5.66      UA negative    Na 131 K 4.5 CO2 16 BUN 86  creat 5.6    Alb 2.9  WBC 21K  Hb 10   IV rocephin    CT 06/29/22 - Adrenals/Urinary Tract: Adrenal glands are unremarkable. Kidneys are normal, without renal calculi, focal lesion, or hydronephrosis. Bladder is unremarkable.     Assessment/ Plan: AKI on CKD IV - b/l creat 3.4 from august 2023, eGFR 15 ml/min.  F/b Dr Marval Regal at [facility]. Creat here 6.49 in setting of sig dehydration w/ falling and syncopal episodes. 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 SP 3-4 L bolus here and w/ IVF"s creat down to 4.1 today. Creat is close to her baseline, will sign off. No further suggestions, will sign off.  Crohn's disease  / sp colectomy w/ ileostomy - pt wants to get outpt IV saline infusions  but hasn't been able to get this started.  Hx of shortgut syndrome HTN - bp meds on hold for now   Rob Khali Albanese 07/08/2022, 10:18 AM   Recent Labs  Lab 07/07/22 0345 07/08/22 0400  HGB 10.3* 9.9*  CALCIUM 8.3* 7.6*  CREATININE 5.66* 4.10*  K 4.5 3.7   No results for input(s): "IRON", "TIBC", "FERRITIN" in the last 168 hours. Inpatient medications:  allopurinol  150 mg Oral QHS   diphenhydrAMINE  25 mg Oral QHS   DULoxetine  120 mg Oral Daily   enoxaparin (LOVENOX) injection  30 mg Subcutaneous U63F   folic acid  1 mg Oral Daily   loperamide  4 mg Oral BID   metoprolol succinate  100 mg Oral Daily   metoprolol succinate  50 mg Oral QHS   simethicone  120 mg Oral QHS   Vitamin D (Ergocalciferol)  50,000 Units Oral Q30 days    sodium chloride 125 mL/hr at 07/08/22 0649   sodium bicarbonate 150 mEq in sterile water 1,150 mL infusion 125 mL/hr at 07/08/22 0506   acetaminophen **OR** acetaminophen, calcium carbonate, magnesium hydroxide, ondansetron **OR** ondansetron (ZOFRAN) IV, traZODone

## 2022-07-09 ENCOUNTER — Inpatient Hospital Stay: Payer: Medicare Other | Admitting: Family Medicine

## 2022-07-09 DIAGNOSIS — K90822 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: Secondary | ICD-10-CM | POA: Diagnosis not present

## 2022-07-09 DIAGNOSIS — E860 Dehydration: Secondary | ICD-10-CM | POA: Diagnosis not present

## 2022-07-09 DIAGNOSIS — N189 Chronic kidney disease, unspecified: Secondary | ICD-10-CM | POA: Diagnosis not present

## 2022-07-09 DIAGNOSIS — N179 Acute kidney failure, unspecified: Secondary | ICD-10-CM | POA: Diagnosis not present

## 2022-07-09 LAB — BASIC METABOLIC PANEL
Anion gap: 10 (ref 5–15)
BUN: 37 mg/dL — ABNORMAL HIGH (ref 8–23)
CO2: 31 mmol/L (ref 22–32)
Calcium: 7.3 mg/dL — ABNORMAL LOW (ref 8.9–10.3)
Chloride: 100 mmol/L (ref 98–111)
Creatinine, Ser: 2.94 mg/dL — ABNORMAL HIGH (ref 0.44–1.00)
GFR, Estimated: 17 mL/min — ABNORMAL LOW (ref >60)
Glucose, Bld: 93 mg/dL (ref 70–99)
Potassium: 3.4 mmol/L — ABNORMAL LOW (ref 3.5–5.1)
Sodium: 141 mmol/L (ref 135–145)

## 2022-07-09 LAB — CBC
HCT: 27.9 % — ABNORMAL LOW (ref 36.0–46.0)
Hemoglobin: 9.1 g/dL — ABNORMAL LOW (ref 12.0–15.0)
MCH: 33.6 pg (ref 26.0–34.0)
MCHC: 32.6 g/dL (ref 30.0–36.0)
MCV: 103 fL — ABNORMAL HIGH (ref 80.0–100.0)
Platelets: 246 10*3/uL (ref 150–400)
RBC: 2.71 MIL/uL — ABNORMAL LOW (ref 3.87–5.11)
RDW: 15.4 % (ref 11.5–15.5)
WBC: 11.3 10*3/uL — ABNORMAL HIGH (ref 4.0–10.5)
nRBC: 0 % (ref 0.0–0.2)

## 2022-07-09 MED ORDER — POTASSIUM CHLORIDE CRYS ER 20 MEQ PO TBCR
40.0000 meq | EXTENDED_RELEASE_TABLET | Freq: Once | ORAL | Status: AC
  Administered 2022-07-09: 40 meq via ORAL
  Filled 2022-07-09: qty 2

## 2022-07-09 MED ORDER — CHOLESTYRAMINE LIGHT 4 G PO PACK
4.0000 g | PACK | Freq: Two times a day (BID) | ORAL | Status: AC
  Administered 2022-07-09 (×2): 4 g via ORAL
  Filled 2022-07-09 (×2): qty 1

## 2022-07-09 NOTE — Progress Notes (Addendum)
[name], is a 66 y.o. female, DOB - [birth year], PRF:[id number] Admit date - 07/06/2022    Outpatient Primary MD for the patient is Carollee Herter, Alferd Apa, DO  LOS - 2  days  Chief Complaint  Patient presents with   Loss of Consciousness       Brief summary   Patient is a 66 year old female with history of asthma, CKD stage IV-V, Crohn's disease status post ileostomy with high output, depression, GERD presented to ED with acute onset of generalized weakness and syncope.  Patient was getting from her bed to the bathroom and upon sitting on her commode, passed out and fell without injuries.  No nausea or vomiting or melena or bright red bleeding with her ileostomy. In ED, BP 97/69, heart rate 112.  Sodium 129, chloride 96, potassium 5.3, CO2 15, creatinine 6.49 compared to BUN 56/creatinine 3.6 on 07/01/2022.  Anion gap 18  She was admitted for further work-up.   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 -Presented with creatinine of 6.49, was 3.6 on 07/01/2022, hyperkalemia, anion gap acidosis, anion gap 18, CO2 15 -Patient was placed in aggressive IV fluid hydration with bicarb drip and saline drip by nephrology -Creatinine has improved, 2.9 today, appears to be closer to her baseline. 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  Leukocytosis improving. -Patient follows with GI, Dr. Havery Moros, she may benefit from outpatient IV fluids in the infusion center.  Per  patient Dr. Havery Moros had arranged this in the past x 2 weekly.  Recommended patient to follow-up with GI and may need to set up outpatient IV fluid hydration and will avoid hospital readmission.   Hypovolemic hyponatremia:  - Na 129 on admission likely due to hypovolemia and dehydration - Now improved    Orthostatic hypotension, syncope - secondary to dehydration - cont IV fluid hydration    Essential hypertension -BP currently stable    GERD without esophagitis cont PPI  Obesity Estimated body mass index is 32.49 kg/m as calculated from the following:   Height as of this encounter: 5' 3"  (1.6 m).   Weight as of this encounter: 83.2 kg.  Code Status: Full code DVT Prophylaxis:  enoxaparin (LOVENOX) injection 30 mg Start: 07/07/22 1000   Level of Care: Level of care: Telemetry Family Communication: Updated patient, currently alert and oriented   Disposition Plan:      Remains inpatient appropriate: Hopefully DC home tomorrow, if creatinine continues to improve   Procedures:  None  Consultants:   Nephrology  Antimicrobials: None  Medications  allopurinol  150 mg Oral QHS  cholestyramine light  4 g Oral Q12H   diphenhydrAMINE  25 mg Oral QHS   DULoxetine  120 mg Oral Daily   enoxaparin (LOVENOX) injection  30 mg Subcutaneous O84Z   folic acid  1 mg Oral Daily   loperamide  4 mg Oral TID   metoprolol succinate  100 mg Oral Daily   metoprolol succinate  50 mg Oral QHS   simethicone  120 mg Oral QHS   Vitamin D (Ergocalciferol)  50,000 Units Oral Q30 days      Subjective:   Jera Headings was seen and examined today.  Stools still watery and loose, no fever chills, abdominal pain or dizziness.    Objective:   Vitals:   07/08/22 2039 07/09/22 0515 07/09/22 1238 07/09/22 1240  BP: 135/75 (!) 148/87 108/70   Pulse: 87 92 (!) 103 95  Resp: 20 20 20    Temp: 98.4 F (36.9 C) 97.6 F (36.4 C) 97.7 F (36.5 C)   TempSrc: Oral Oral Oral   SpO2: 94% 96%     Weight:      Height:        Intake/Output Summary (Last 24 hours) at 07/09/2022 1317 Last data filed at 07/09/2022 0553 Gross per 24 hour  Intake 5231.93 ml  Output 1270 ml  Net 3961.93 ml     Wt Readings from Last 3 Encounters:  07/07/22 83.2 kg  06/29/22 83.8 kg  03/10/22 80.8 kg    Physical Exam General: Alert and oriented x 3, NAD Cardiovascular: S1 S2 clear, RRR.  Respiratory: CTAB, no wheezing, rales or rhonchi Gastrointestinal: Soft, nontender, ileostomy with watery stool Ext: no pedal edema bilaterally Neuro: no new deficits Skin: No rashes psych: Normal affect  Data Reviewed:  I have personally reviewed following labs    CBC Lab Results  Component Value Date   WBC 11.3 (H) 07/09/2022   RBC 2.71 (L) 07/09/2022   HGB 9.1 (L) 07/09/2022   HCT 27.9 (L) 07/09/2022   MCV 103.0 (H) 07/09/2022   MCH 33.6 07/09/2022   PLT 246 07/09/2022   MCHC 32.6 07/09/2022   RDW 15.4 07/09/2022   LYMPHSABS 3.0 06/29/2022   MONOABS 1.2 (H) 06/29/2022   EOSABS 1.3 (H) 06/29/2022   BASOSABS 0.1 66/02/3015     Last metabolic panel Lab Results  Component Value Date   NA 141 07/09/2022   K 3.4 (L) 07/09/2022   CL 100 07/09/2022   CO2 31 07/09/2022   BUN 37 (H) 07/09/2022   CREATININE 2.94 (H) 07/09/2022   GLUCOSE 93 07/09/2022   GFRNONAA 17 (L) 07/09/2022   GFRAA 24 (L) 08/16/2018   CALCIUM 7.3 (L) 07/09/2022   PHOS 4.3 12/18/2020   PROT 6.5 07/01/2022   ALBUMIN 2.9 (L) 07/01/2022   BILITOT 0.2 (L) 07/01/2022   ALKPHOS 90 07/01/2022   AST 23 07/01/2022   ALT 22 07/01/2022   ANIONGAP 10 07/09/2022    CBG (last 3)  Recent Labs    07/06/22 1805 07/07/22 0731  GLUCAP 131* 77      Coagulation Profile: No results for input(s): "INR", "PROTIME" in the last 168 hours.   Radiology Studies: I have personally reviewed the imaging studies  No results found.     Estill Cotta M.D. [facility] 07/09/2022, 1:17 PM  Available via Epic secure chat  7am-7pm After 7 pm, please refer to night coverage provider listed on amion.

## 2022-07-09 NOTE — Consult Note (Signed)
   [facility] Inpatient Consult   07/09/2022  [name] November 01, 1955 [id number]  [facility] [ACO] Patient: [facility] Liaison remote coverage review for patient at [place] Provider:  Carollee Herter, Alferd Apa, DO with [place] at [facility] is listed to provide the transition of care follow up   Patient screened for hospitalization with noted high risk score for unplanned readmission with less than 7 days readmission.  Also, to assess for potential [place] [facility] service needs for post hospital transition for care coordination.  Review of patient's electronic medical record reveals patient is likely for home over the weekend per MD progress notes, documented SDOH reviewed and no needs assessed.   Plan:  Continue to follow progress and disposition to assess for post hospital community care coordination/management needs.   Of note, [facility] Care Management/Population Health does not replace or interfere with any arrangements made by the Inpatient Transition of Care team.  For questions contact:   Natividad Brood, RN BSN [place]  [phone] business mobile phone Toll free office [phone]  *[place]  [phone] Fax number: [phone] [email or web address]

## 2022-07-09 NOTE — Progress Notes (Signed)
Mobility Specialist - Progress Note  07/09/22 0928  Mobility  Activity Ambulated with assistance in hallway  Level of Assistance Independent after set-up  Assistive Device  (IV Pole)  Distance Ambulated (ft) 100 ft  Activity Response Tolerated well  Mobility Referral Yes  $Mobility charge 1 Mobility   Pt received in bed and agreeable to mobility. No complaints during mobility. Pt to bed after session with all needs met.    [facility]

## 2022-07-10 LAB — COMPREHENSIVE METABOLIC PANEL
ALT: 20 U/L (ref 0–44)
AST: 26 U/L (ref 15–41)
Albumin: 3.2 g/dL — ABNORMAL LOW (ref 3.5–5.0)
Alkaline Phosphatase: 69 U/L (ref 38–126)
Anion gap: 10 (ref 5–15)
BUN: 30 mg/dL — ABNORMAL HIGH (ref 8–23)
CO2: 31 mmol/L (ref 22–32)
Calcium: 7.4 mg/dL — ABNORMAL LOW (ref 8.9–10.3)
Chloride: 101 mmol/L (ref 98–111)
Creatinine, Ser: 2.75 mg/dL — ABNORMAL HIGH (ref 0.44–1.00)
GFR, Estimated: 18 mL/min — ABNORMAL LOW (ref >60)
Glucose, Bld: 88 mg/dL (ref 70–99)
Potassium: 3.3 mmol/L — ABNORMAL LOW (ref 3.5–5.1)
Sodium: 142 mmol/L (ref 135–145)
Total Bilirubin: 0.6 mg/dL (ref 0.3–1.2)
Total Protein: 6.5 g/dL (ref 6.5–8.1)

## 2022-07-10 LAB — CBC
HCT: 29.8 % — ABNORMAL LOW (ref 36.0–46.0)
Hemoglobin: 9.6 g/dL — ABNORMAL LOW (ref 12.0–15.0)
MCH: 34.2 pg — ABNORMAL HIGH (ref 26.0–34.0)
MCHC: 32.2 g/dL (ref 30.0–36.0)
MCV: 106 fL — ABNORMAL HIGH (ref 80.0–100.0)
Platelets: 253 10*3/uL (ref 150–400)
RBC: 2.81 MIL/uL — ABNORMAL LOW (ref 3.87–5.11)
RDW: 15.3 % (ref 11.5–15.5)
WBC: 12.4 10*3/uL — ABNORMAL HIGH (ref 4.0–10.5)
nRBC: 0 % (ref 0.0–0.2)

## 2022-07-10 NOTE — Progress Notes (Signed)
Consultation Progress Note   Patient: [name] [id number] DOB: [birth year] DOA: 07/06/2022 DOS: the patient was seen and examined on 07/10/2022 Primary service: DibiaManfred Shirts, MD  Brief hospital course: Patient is a 66 year old female with history of asthma, CKD stage IV-V, Crohn's disease status post ileostomy with high output, depression, GERD presented to ED with acute onset of generalized weakness and syncope.  Patient was getting from her bed to the bathroom and upon sitting on her commode, passed out and fell without injuries.  No nausea or vomiting or melena or bright red bleeding with her ileostomy. In ED, BP 97/69, heart rate 112.  Sodium 129, chloride 96, potassium 5.3, CO2 15, creatinine 6.49 compared to BUN 56/creatinine 3.6 on 07/01/2022.  Anion gap 18  Assessment and Plan: * Acute kidney injury superimposed on chronic kidney disease ([place]) - This is AKI superimposed on CKD 4-5.  This is clearly secondary to high output from her ileostomy and subsequent volume depletion and dehydration. - The patient will be admitted to a medical telemetry observation bed. - We will continue hydration with IV normal saline. - We will follow serial BMPs. - We will avoid nephrotoxins. - Nephrology consult can be called in AM. - I sent a timed message to Dr. Augustin Coupe.  Orthostatic hypotension - This is likely the culprit for her syncope from associated volume depletion. - We will continue hydration as mentioned above and follow orthostatics.  GERD without esophagitis - We will continue PPI therapy.  High anion gap metabolic acidosis - This is clearly secondary to #1. - We will continue hydration and management as above and follow BMPs.         TRH will continue to follow the patient.  Subjective: Feels good this morning, reports thickening of ostomy output.  Scr level pending. Recheck K levels,  WBC count slightly bumped this morning.  Physical Exam Constitutional:       Appearance: Normal appearance.  HENT:     Head: Normocephalic and atraumatic.     Nose: Nose normal.     Mouth/Throat:     Mouth: Mucous membranes are moist.  Eyes:     Pupils: Pupils are equal, round, and reactive to light.  Cardiovascular:     Rate and Rhythm: Normal rate and regular rhythm.     Pulses: Normal pulses.     Heart sounds: Normal heart sounds.  Pulmonary:     Effort: Pulmonary effort is normal.  Abdominal:     General: Abdomen is flat.  Musculoskeletal:        General: Normal range of motion.     Cervical back: Normal range of motion.  Skin:    General: Skin is warm.     Capillary Refill: Capillary refill takes less than 2 seconds.  Neurological:     General: No focal deficit present.     Mental Status: She is alert.  Psychiatric:        Mood and Affect: Mood normal.    Abdomen- ileostomy site appears c/d/I, output is watery, non bloody. Vitals:   07/09/22 1240 07/09/22 1656 07/09/22 1945 07/10/22 0522  BP:   (!) 142/90 (!) 152/85  Pulse: 95  90 93  Resp:   19 19  Temp:   98 F (36.7 C) 98.2 F (36.8 C)  TempSrc:   Oral Oral  SpO2:  98% 98% 95%  Weight:      Height:        Data Reviewed:  There are  no new results to review at this time.  Family Communication:   Time spent: 15 minutes.  Author: Cristela Felt, MD 07/10/2022 9:36 AM  For on call review [email or web address].

## 2022-07-11 DIAGNOSIS — N179 Acute kidney failure, unspecified: Secondary | ICD-10-CM | POA: Diagnosis not present

## 2022-07-11 DIAGNOSIS — N189 Chronic kidney disease, unspecified: Secondary | ICD-10-CM | POA: Diagnosis not present

## 2022-07-11 LAB — COMPREHENSIVE METABOLIC PANEL
ALT: 23 U/L (ref 0–44)
AST: 29 U/L (ref 15–41)
Albumin: 3 g/dL — ABNORMAL LOW (ref 3.5–5.0)
Alkaline Phosphatase: 67 U/L (ref 38–126)
Anion gap: 10 (ref 5–15)
BUN: 28 mg/dL — ABNORMAL HIGH (ref 8–23)
CO2: 28 mmol/L (ref 22–32)
Calcium: 7.2 mg/dL — ABNORMAL LOW (ref 8.9–10.3)
Chloride: 100 mmol/L (ref 98–111)
Creatinine, Ser: 3.03 mg/dL — ABNORMAL HIGH (ref 0.44–1.00)
GFR, Estimated: 16 mL/min — ABNORMAL LOW (ref >60)
Glucose, Bld: 96 mg/dL (ref 70–99)
Potassium: 3.5 mmol/L (ref 3.5–5.1)
Sodium: 138 mmol/L (ref 135–145)
Total Bilirubin: 0.5 mg/dL (ref 0.3–1.2)
Total Protein: 6.6 g/dL (ref 6.5–8.1)

## 2022-07-11 LAB — CBC
HCT: 29.8 % — ABNORMAL LOW (ref 36.0–46.0)
Hemoglobin: 9.3 g/dL — ABNORMAL LOW (ref 12.0–15.0)
MCH: 33.5 pg (ref 26.0–34.0)
MCHC: 31.2 g/dL (ref 30.0–36.0)
MCV: 107.2 fL — ABNORMAL HIGH (ref 80.0–100.0)
Platelets: 245 10*3/uL (ref 150–400)
RBC: 2.78 MIL/uL — ABNORMAL LOW (ref 3.87–5.11)
RDW: 14.9 % (ref 11.5–15.5)
WBC: 15.7 10*3/uL — ABNORMAL HIGH (ref 4.0–10.5)
nRBC: 0 % (ref 0.0–0.2)

## 2022-07-11 NOTE — Progress Notes (Signed)
Consultation Progress Note   Patient: [name] [id number]:[id number] DOB: April 29, 1956 DOA: 07/06/2022 DOS: the patient was seen and examined on 07/11/2022 Primary service: Bonnell Public, MD  Brief hospital course: Patient is a 66 year old female with history of asthma, CKD stage IV-V, Crohn's disease status post ileostomy with high output, depression, GERD presented to ED with acute onset of generalized weakness and syncope.  Patient was getting from her bed to the bathroom and upon sitting on her commode, passed out and fell without injuries.  No nausea or vomiting or melena or bright red bleeding with her ileostomy. In ED, BP 97/69, heart rate 112.  Sodium 129, chloride 96, potassium 5.3, CO2 15, creatinine 6.49 compared to BUN 56/creatinine 3.6 on 07/01/2022.  Anion gap 18  07/11/2022: Patient has baseline chronic kidney disease stage IV/V.  Patient was admitted with acute kidney injury on chronic kidney disease stage IV/V.  AKI is likely prerenal.  Patient has history of colectomy and ileostomy.  AKI has resolved significantly with hydration.  Renal function seems close to baseline.  Hyperkalemia has also resolved.  Nephrology team input is appreciated.  Patient may need IV fluids 2 to 3 days a week on discharge, if okay with nephrology team.  Hopefully, nephrology team will clear patient for discharge tomorrow.  Assessment and Plan: * Acute kidney injury superimposed on chronic kidney disease ([place]) - This is AKI superimposed on CKD 4-5.  This is clearly secondary to high output from her ileostomy and subsequent volume depletion and dehydration. - The patient will be admitted to a medical telemetry observation bed. - We will continue hydration with IV normal saline. - We will follow serial BMPs. - We will avoid nephrotoxins. - Nephrology consult can be called in AM. - I sent a timed message to Dr. Augustin Coupe. 07/11/2022: Renal function is back to baseline.  See above documentation.  Hopefully,  nephrology team will clear patient for discharge tomorrow.  Patient will likely need IV fluids to 3 times a week on discharge if okay with nephrology team.  Orthostatic hypotension - This is likely the culprit for her syncope from associated volume depletion. - We will continue hydration as mentioned above and follow orthostatics. 07/11/2022: Likely related to volume depletion.  GERD without esophagitis - We will continue PPI therapy.  High anion gap metabolic acidosis - Resolved -Patient has advanced chronic kidney disease. -Nephrology team to follow-up patient and manage acidosis accordingly.  Patient may need sodium bicarbonate.  For now, CO2 is 28, therefore, sodium bicarbonate is not indicated.      TRH will continue to follow the patient.  Subjective: Feels good this morning, reports thickening of ostomy output.  Scr level pending. Recheck K levels,  WBC count slightly bumped this morning.  Physical Exam Constitutional:      Appearance: Normal appearance.  HENT:     Head: Normocephalic and atraumatic.     Nose: Nose normal.     Mouth/Throat:     Mouth: Mucous membranes are moist.  Eyes:     Pupils: Pupils are equal, round, and reactive to light.  Cardiovascular:     Rate and Rhythm: Normal rate and regular rhythm.     Pulses: Normal pulses.     Heart sounds: Normal heart sounds.  Pulmonary:     Effort: Pulmonary effort is normal.  Abdominal:     General: Abdomen is flat.  Musculoskeletal:        General: Normal range of motion.  Cervical back: Normal range of motion.  Skin:    General: Skin is warm.     Capillary Refill: Capillary refill takes less than 2 seconds.  Neurological:     General: No focal deficit present.     Mental Status: She is alert.  Psychiatric:        Mood and Affect: Mood normal.    Abdomen- ileostomy site appears c/d/I, output is watery, non bloody. Vitals:   07/10/22 1751 07/10/22 2036 07/11/22 0404 07/11/22 1254  BP: (!) 145/92  (!) 146/95 (!) 132/91 (!) 132/93  Pulse: 90 93 92 92  Resp:  18 18 18   Temp:  98.4 F (36.9 C) 97.8 F (36.6 C) 98.4 F (36.9 C)  TempSrc:  Oral Oral Oral  SpO2:  98% 96% 97%  Weight:      Height:        Data Reviewed:  There are no new results to review at this time.  Family Communication:   Time spent: 35 minutes.  Author: Bonnell Public, MD 07/11/2022 3:16 PM  For on call review [email or web address].

## 2022-07-11 NOTE — Plan of Care (Signed)
  Problem: Clinical Measurements: Goal: Ability to maintain clinical measurements within normal limits will improve Outcome: Progressing Goal: Respiratory complications will improve Outcome: Progressing Goal: Cardiovascular complication will be avoided Outcome: Progressing

## 2022-07-12 ENCOUNTER — Other Ambulatory Visit: Payer: Self-pay

## 2022-07-12 DIAGNOSIS — N184 Chronic kidney disease, stage 4 (severe): Secondary | ICD-10-CM

## 2022-07-12 DIAGNOSIS — A419 Sepsis, unspecified organism: Secondary | ICD-10-CM

## 2022-07-12 NOTE — Discharge Summary (Signed)
Physician Discharge Summary  [name] [id number]:[id number] DOB: [birth year] DOA: 07/06/2022  PCP: Ann Held, DO  Admit date: 07/06/2022  Discharge date: 07/12/2022  Admitted From: Home.  Disposition:  Home.  Recommendations for Outpatient Follow-up:  Follow up with PCP in 1-2 weeks Please obtain BMP/CBC in one week Advised to follow-up with Nephrology as per schedule.  Home Health: None Equipment/Devices:None  Discharge Condition: Stable CODE STATUS:Full code Diet recommendation: Heart Healthy   [facility]rief [facility] Course: This 65 year old female with history of asthma, CKD stage IV-V, Crohn's disease status post ileostomy with high output, depression, GERD presented to ED with acute onset of generalized weakness and syncope.  Patient was getting from her bed to the bathroom and upon sitting on her commode, passed out and fell without injuries.  No nausea or vomiting or melena or bright red bleeding with her ileostomy.  Patient was admitted for further management.  She ws found to have acute kidney injury with serum creatinine 6.4, Likely pre-renal due to high output colostomy,  Nephrology is consulted, AKI has significantly resolved with IV hydration.  Renal function seems close to baseline.  Patient felt better and patient being discharged home.  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  This is clearly secondary to high output from  her ileostomy and subsequent volume depletion and dehydration. - continue hydration with IV normal saline. - Avoid nephrotoxic medications. -Nephrology consulted recommended to continue IV hydration. 07/11/2022: Renal function is back to baseline.     Orthostatic hypotension - This is likely the culprit for her syncope from associated volume depletion. - Continue hydration as mentioned above and follow orthostatics. 07/11/2022: Likely related to volume depletion.   GERD without esophagitis - Continue PPI therapy.   High anion gap metabolic acidosis - Resolved -Patient has advanced chronic kidney disease. -Nephrology team to follow-up patient and manage acidosis accordingly.  Patient may need sodium bicarbonate.  For now, CO2 is 28, therefore, sodium bicarbonate is not indicated.    Discharge Instructions  Discharge Instructions     Call MD for:  difficulty breathing, headache or visual disturbances   Complete by: As directed    Call MD for:  persistant dizziness or light-headedness   Complete by: As directed    Call MD for:  persistant nausea and vomiting   Complete by: As directed    Diet - low sodium heart healthy   Complete by: As directed    Diet Carb Modified   Complete by: As directed    Discharge instructions   Complete by: As directed    Advised to follow-up with primary care physician in 1 week. Advised to follow-up with nephrology as per schedule. Advised to continue to drink fluids as advised.   Increase activity slowly   Complete by: As directed       Allergies as of 07/12/2022       Reactions   Sulfa Antibiotics Hives, Itching, Rash, Swelling  Morphine Other (See Comments)   "Gives me crazy dreams" (delusions, also)   Sulfonamide Derivatives Hives, Itching, Swelling        Medication List     STOP taking these medications    famotidine 20 MG tablet Commonly known as: PEPCID       TAKE these medications    acetaminophen 500 MG  tablet Commonly known as: TYLENOL Take 1,000 mg by mouth at bedtime as needed (for pain/sleep).   allopurinol 300 MG tablet Commonly known as: ZYLOPRIM TAKE ONE TABLET BY MOUTH DAILY AT 9AM What changed: See the new instructions.   amLODipine 5 MG tablet Commonly known as: NORVASC Take 5 mg by mouth daily.   Benadryl Allergy 25 MG tablet Generic drug: diphenhydrAMINE Take 25 mg by mouth at bedtime.   DULoxetine 60 MG capsule Commonly known as: CYMBALTA TAKE TWO CAPSULES (120MG) BY MOUTH DAILY AT 9AM What changed: See the new instructions.   folic acid 1 MG tablet Commonly known as: FOLVITE Take 2 tablets (2 mg total) by mouth daily. What changed: how much to take   Gas-X Extra Strength 125 MG Caps Generic drug: Simethicone Take 125 mg by mouth at bedtime.   loperamide 2 MG tablet Commonly known as: IMODIUM A-D Take 4 mg by mouth at bedtime.   metoprolol succinate 100 MG 24 hr tablet Commonly known as: TOPROL-XL Take 50-100 mg by mouth See admin instructions. Take 100 mg by mouth in the morning and 50 mg at bedtime   OVER THE COUNTER MEDICATION Take 1 tablet by mouth See admin instructions. Nervive Nerve Health tablet- Take 1 tablet by mouth at bedtime   TUMS EXTRA STRENGTH 750 PO Take 2 tablets by mouth daily as needed (heartburn).   Vitamin D (Ergocalciferol) 1.25 MG (50000 UNIT) Caps capsule Commonly known as: DRISDOL Take 50,000 Units by mouth every 30 (thirty) days.        Follow-up Information     Roma Schanz R, DO Follow up in 1 week(s).   Specialty: Family Medicine Contact information: [address] [place] [address] [phone]                Allergies  Allergen Reactions   Sulfa Antibiotics Hives, Itching, Rash and Swelling   Morphine Other (See Comments)    "Gives me crazy dreams" (delusions, also)   Sulfonamide Derivatives Hives, Itching and Swelling     Consultations: Nephrology   Procedures/Studies: CT ABDOMEN PELVIS WO CONTRAST  Result Date: 06/29/2022 CLINICAL DATA:  Acute left upper quadrant abdominal pain. EXAM: CT ABDOMEN AND PELVIS WITHOUT CONTRAST TECHNIQUE: Multidetector CT imaging of the abdomen and pelvis was performed following the standard protocol without IV contrast. RADIATION DOSE REDUCTION: This exam was performed according to the departmental dose-optimization program which includes automated exposure control, adjustment of the mA and/or kV according to patient size and/or use of iterative reconstruction technique. COMPARISON:  September 22, 2021. FINDINGS: Lower chest: No acute abnormality. Hepatobiliary: No focal liver abnormality is seen. Status post cholecystectomy. No biliary dilatation. Pancreas: Unremarkable. No pancreatic ductal dilatation or surrounding inflammatory changes. Spleen: Normal in size without focal abnormality. Adrenals/Urinary Tract: Adrenal glands are unremarkable. Kidneys are normal, without renal calculi, focal lesion, or hydronephrosis. Bladder is unremarkable. Stomach/Bowel: The stomach appears normal. Status post colectomy with ileostomy seen in right lower quadrant. No abnormal bowel dilatation or inflammation is noted. Vascular/Lymphatic: No significant vascular findings are present. No enlarged abdominal or pelvic lymph nodes. Reproductive: Status post  hysterectomy. No adnexal masses. Other: No significant hernia or ascites is noted. Musculoskeletal: No acute or significant osseous findings. IMPRESSION: Status post colectomy with ileostomy seen in right lower quadrant. No abnormal bowel dilatation or inflammation is noted. No acute abnormality seen in the abdomen or pelvis. Electronically Signed   By: Marijo Conception M.D.   On: 06/29/2022 12:24   DG Chest Portable 1 View  Result Date: 06/29/2022 CLINICAL DATA:  Hypotension. Chronic left upper quadrant abdominal pain. EXAM: PORTABLE CHEST 1 VIEW  COMPARISON:  Chest x-ray dated September 22, 2021. FINDINGS: The heart size and mediastinal contours are within normal limits. Both lungs are clear. The visualized skeletal structures are unremarkable. IMPRESSION: No active disease. Electronically Signed   By: Titus Dubin M.D.   On: 06/29/2022 11:09      Subjective: Patient was seen and examined at bedside.  Overnight events noted.   Patient seems much improved.  Tolerated diet.   Renal functions improved and back to baseline.  Patient being discharged home.  Discharge Exam: Vitals:   07/12/22 0517 07/12/22 0949  BP: 115/71 (!) 148/98  Pulse: (!) 108 92  Resp: 20   Temp: 98 F (36.7 C)   SpO2: 92%    Vitals:   07/11/22 1254 07/11/22 2032 07/12/22 0517 07/12/22 0949  BP: (!) 132/93 (!) 156/86 115/71 (!) 148/98  Pulse: 92 92 (!) 108 92  Resp: 18 20 20    Temp: 98.4 F (36.9 C) 98.2 F (36.8 C) 98 F (36.7 C)   TempSrc: Oral Oral Oral   SpO2: 97% 97% 92%   Weight:      Height:        General: Pt is alert, awake, not in acute distress Cardiovascular: RRR, S1/S2 +, no rubs, no gallops Respiratory: CTA bilaterally, no wheezing, no rhonchi Abdominal: Soft, NT, ND, bowel sounds +, Colostomy+ Extremities: no edema, no cyanosis    The results of significant diagnostics from this hospitalization (including imaging, microbiology, ancillary and laboratory) are listed below for reference.     Microbiology: Recent Results (from the past 240 hour(s))  C Difficile Quick Screen w PCR reflex     Status: None   Collection Time: 07/08/22 10:30 AM   Specimen: STOOL  Result Value Ref Range Status   C Diff antigen NEGATIVE NEGATIVE Final   C Diff toxin NEGATIVE NEGATIVE Final   C Diff interpretation No C. difficile detected.  Final    Comment: Performed at [facility], [place] [address] [place], [place] [address]     Labs: BNP (last 3 results) No results for input(s): "BNP" in the last 8760 hours. Basic  Metabolic Panel: Recent Labs  Lab 07/07/22 0345 07/08/22 0400 07/09/22 0402 07/10/22 0837 07/11/22 0332  NA 131* 139 141 142 138  K 4.5 3.7 3.4* 3.3* 3.5  CL 104 110 100 101 100  CO2 16* 18* 31 31 28   GLUCOSE 104* 87 93 88 96  BUN 86* 69* 37* 30* 28*  CREATININE 5.66* 4.10* 2.94* 2.75* 3.03*  CALCIUM 8.3* 7.6* 7.3* 7.4* 7.2*   Liver Function Tests: Recent Labs  Lab 07/10/22 0837 07/11/22 0332  AST 26 29  ALT 20 23  ALKPHOS 69 67  BILITOT 0.6 0.5  PROT 6.5 6.6  ALBUMIN 3.2* 3.0*   No results for input(s): "LIPASE", "AMYLASE" in the last 168 hours. No results for input(s): "AMMONIA" in the last 168 hours. CBC: Recent Labs  Lab 07/07/22 0345 07/08/22 0400 07/09/22 0402 07/10/22 0417  07/11/22 0332  WBC 21.4* 17.4* 11.3* 12.4* 15.7*  HGB 10.3* 9.9* 9.1* 9.6* 9.3*  HCT 32.0* 30.0* 27.9* 29.8* 29.8*  MCV 103.9* 103.4* 103.0* 106.0* 107.2*  PLT 317 304 246 253 245   Cardiac Enzymes: No results for input(s): "CKTOTAL", "CKMB", "CKMBINDEX", "TROPONINI" in the last 168 hours. BNP: Invalid input(s): "POCBNP" CBG: Recent Labs  Lab 07/06/22 1805 07/07/22 0731  GLUCAP 131* 77   D-Dimer No results for input(s): "DDIMER" in the last 72 hours. Hgb A1c No results for input(s): "HGBA1C" in the last 72 hours. Lipid Profile No results for input(s): "CHOL", "HDL", "LDLCALC", "TRIG", "CHOLHDL", "LDLDIRECT" in the last 72 hours. Thyroid function studies No results for input(s): "TSH", "T4TOTAL", "T3FREE", "THYROIDAB" in the last 72 hours.  Invalid input(s): "FREET3" Anemia work up No results for input(s): "VITAMINB12", "FOLATE", "FERRITIN", "TIBC", "IRON", "RETICCTPCT" in the last 72 hours. Urinalysis    Component Value Date/Time   COLORURINE STRAW (A) 07/07/2022 0038   APPEARANCEUR CLEAR 07/07/2022 0038   LABSPEC 1.005 07/07/2022 0038   PHURINE 5.0 07/07/2022 0038   GLUCOSEU NEGATIVE 07/07/2022 0038   GLUCOSEU NEGATIVE 03/14/2009 1603   HGBUR SMALL (A) 07/07/2022  0038   BILIRUBINUR NEGATIVE 07/07/2022 0038   BILIRUBINUR negative 03/05/2022 1121   KETONESUR NEGATIVE 07/07/2022 0038   PROTEINUR NEGATIVE 07/07/2022 0038   UROBILINOGEN 0.2 03/05/2022 1121   UROBILINOGEN 0.2 04/12/2013 0900   NITRITE NEGATIVE 07/07/2022 0038   LEUKOCYTESUR NEGATIVE 07/07/2022 0038   Sepsis Labs Recent Labs  Lab 07/08/22 0400 07/09/22 0402 07/10/22 0417 07/11/22 0332  WBC 17.4* 11.3* 12.4* 15.7*   Microbiology Recent Results (from the past 240 hour(s))  C Difficile Quick Screen w PCR reflex     Status: None   Collection Time: 07/08/22 10:30 AM   Specimen: STOOL  Result Value Ref Range Status   C Diff antigen NEGATIVE NEGATIVE Final   C Diff toxin NEGATIVE NEGATIVE Final   C Diff interpretation No C. difficile detected.  Final    Comment: Performed at [facility], [place] [address], [place], [place] [address]     Time coordinating discharge: Over 30 minutes  SIGNED:   Shawna Clamp, MD  [facility] 07/12/2022, 3:02 PM Pager   If 7PM-7AM, please contact night-coverage

## 2022-07-12 NOTE — Plan of Care (Signed)
  Problem: Clinical Measurements: Goal: Ability to maintain clinical measurements within normal limits will improve Outcome: Progressing Goal: Diagnostic test results will improve Outcome: Progressing   Problem: Activity: Goal: Risk for activity intolerance will decrease Outcome: Progressing

## 2022-07-12 NOTE — Discharge Instructions (Signed)
Advised to follow-up with primary care physician in 1 week. Advised to follow-up with nephrology as per schedule. Advised to drink fluids as advised.

## 2022-07-12 NOTE — Plan of Care (Signed)
  Problem: Education: Goal: Knowledge of General Education information will improve Description Including pain rating scale, medication(s)/side effects and non-pharmacologic comfort measures Outcome: Progressing   Problem: Health Behavior/Discharge Planning: Goal: Ability to manage health-related needs will improve Outcome: Progressing   

## 2022-07-12 NOTE — TOC Transition Note (Signed)
Transition of Care [facility]) - CM/SW Discharge Note   Patient Details  Name: [name] MRN: [id number] Date of Birth: 1955/09/19  Transition of Care [facility]) CM/SW Contact:  Leeroy Cha, RN Phone Number: 07/12/2022, 9:49 AM   Clinical Narrative:    [id number] discharged to return home.  Chart reviewed for TOC needs.  None found.  Patient self care.     Barriers to Discharge: Continued Medical Work up   Patient Goals and CMS Choice   CMS Medicare.gov Compare Post Acute Care list provided to:: Patient    Discharge Placement                       Discharge Plan and Services   Discharge Planning Services: CM Consult                                 Social Determinants of Health (SDOH) Interventions     Readmission Risk Interventions   Row Labels 05/15/2021    1:42 PM 12/10/2020   10:57 AM  Readmission Risk Prevention Plan   Section Header. No data exists in this row.    Transportation Screening   Complete Complete  PCP or Specialist Appt within 3-5 Days    Complete  HRI or Home Care Consult   Complete Complete  Social Work Consult for [place] Planning/Counseling   Complete Complete  Palliative Care Screening   Not Applicable Not Applicable  Medication Review [employer])    Complete

## 2022-07-13 ENCOUNTER — Telehealth: Payer: Self-pay | Admitting: *Deleted

## 2022-07-13 ENCOUNTER — Telehealth: Payer: Self-pay

## 2022-07-13 NOTE — Progress Notes (Signed)
  Care Coordination  Outreach Note  07/13/2022 Name: [name] MRN: [id number] DOB: September 26, 1955   Care Coordination Outreach Attempts: An unsuccessful telephone outreach was attempted today to offer the patient information about available care coordination services as a benefit of their health plan.   Referral received   Follow Up Plan:  Additional outreach attempts will be made to offer the patient care coordination information and services.   Encounter Outcome:  No Answer  Julian Hy, [place] Direct Dial: [phone]

## 2022-07-13 NOTE — Telephone Encounter (Signed)
Transition Care Management Follow-up Telephone Call Date of discharge and from where: [place] 07-12-22 Dx: AKI superimposed on CKD  How have you been since you were released from the hospital? Feeling ok  Any questions or concerns? Pt states that her Nephrologist wants someone "to give me fluids twice a week to keep me hydrated and out of the hospital and off of dialysis". Pt will be discussing this at hosp fu with you .   Items Reviewed: Did the pt receive and understand the discharge instructions provided? Yes  Medications obtained and verified? Yes  Other? No  Any new allergies since your discharge? No  Dietary orders reviewed? Yes Do you have support at home? Yes   Home Care and Equipment/Supplies: Were home health services ordered? no If so, what is the name of the agency? na  Has the agency set up a time to come to the patient's home? not applicable Were any new equipment or medical supplies ordered?  No What is the name of the medical supply agency? na Were you able to get the supplies/equipment? not applicable Do you have any questions related to the use of the equipment or supplies? No  Functional Questionnaire: (I = Independent and D = Dependent) ADLs: I  Bathing/Dressing- I  Meal Prep- I  Eating- I  Maintaining continence- I  Transferring/Ambulation- I  Managing Meds- I  Follow up appointments reviewed:  PCP Hospital f/u appt confirmed? Yes  Scheduled to see Dr Cheri Rous  on 07-15-22 @ [facility] f/u appt confirmed? Yes  Scheduled to see Dr Havery Moros on 07-26-22 @ 9am. Are transportation arrangements needed? No  If their condition worsens, is the pt aware to call PCP or go to the Emergency Dept.? Yes Was the patient provided with contact information for the PCP's office or ED? Yes Was to pt encouraged to call back with questions or concerns? Yes   Juanda Crumble LPN [place] Direct Dial [phone]

## 2022-07-13 NOTE — Progress Notes (Signed)
  Care Coordination   Note   07/13/2022 Name: Caria Transue MRN: [id number] DOB: 09/16/1955  Audrea Muscat ALIEYAH SPADER is a 66 y.o. year old female who sees Carollee Herter, Alferd Apa, DO for primary care. I reached out to [name] by phone today to offer care coordination services.  Ms. Nordquist was given information about Care Coordination services today including:   The Care Coordination services include support from the care team which includes your Nurse Coordinator, Clinical Social Worker, or Pharmacist.  The Care Coordination team is here to help remove barriers to the health concerns and goals most important to you. Care Coordination services are voluntary, and the patient may decline or stop services at any time by request to their care team member.   Care Coordination Consent Status: Patient agreed to services and verbal consent obtained.   Follow up plan:  Telephone appointment with care coordination team member scheduled for:  08/04/2022  Encounter Outcome:  Pt. Scheduled from referral   Julian Hy, [place] Direct Dial: [phone]

## 2022-07-15 ENCOUNTER — Encounter: Payer: Self-pay | Admitting: Family Medicine

## 2022-07-15 ENCOUNTER — Other Ambulatory Visit: Payer: Self-pay

## 2022-07-15 ENCOUNTER — Ambulatory Visit (INDEPENDENT_AMBULATORY_CARE_PROVIDER_SITE_OTHER): Payer: Medicare Other | Admitting: Family Medicine

## 2022-07-15 VITALS — BP 108/70 | HR 94 | Temp 97.7°F | Resp 18 | Ht 63.0 in | Wt 179.6 lb

## 2022-07-15 DIAGNOSIS — R197 Diarrhea, unspecified: Secondary | ICD-10-CM | POA: Diagnosis not present

## 2022-07-15 DIAGNOSIS — N179 Acute kidney failure, unspecified: Secondary | ICD-10-CM

## 2022-07-15 DIAGNOSIS — N184 Chronic kidney disease, stage 4 (severe): Secondary | ICD-10-CM | POA: Diagnosis not present

## 2022-07-15 DIAGNOSIS — K50819 Crohn's disease of both small and large intestine with unspecified complications: Secondary | ICD-10-CM

## 2022-07-15 DIAGNOSIS — E860 Dehydration: Secondary | ICD-10-CM

## 2022-07-15 DIAGNOSIS — D509 Iron deficiency anemia, unspecified: Secondary | ICD-10-CM | POA: Diagnosis not present

## 2022-07-15 DIAGNOSIS — I10 Essential (primary) hypertension: Secondary | ICD-10-CM

## 2022-07-15 LAB — CBC WITH DIFFERENTIAL/PLATELET
Basophils Absolute: 0.1 10*3/uL (ref 0.0–0.1)
Basophils Relative: 0.9 % (ref 0.0–3.0)
Eosinophils Absolute: 2.6 10*3/uL — ABNORMAL HIGH (ref 0.0–0.7)
Eosinophils Relative: 21.7 % — ABNORMAL HIGH (ref 0.0–5.0)
HCT: 32.8 % — ABNORMAL LOW (ref 36.0–46.0)
Hemoglobin: 10.7 g/dL — ABNORMAL LOW (ref 12.0–15.0)
Lymphocytes Relative: 16.4 % (ref 12.0–46.0)
Lymphs Abs: 2 10*3/uL (ref 0.7–4.0)
MCHC: 32.6 g/dL (ref 30.0–36.0)
MCV: 103.7 fl — ABNORMAL HIGH (ref 78.0–100.0)
Monocytes Absolute: 0.5 10*3/uL (ref 0.1–1.0)
Monocytes Relative: 4.1 % (ref 3.0–12.0)
Neutro Abs: 6.9 10*3/uL (ref 1.4–7.7)
Neutrophils Relative %: 56.9 % (ref 43.0–77.0)
Platelets: 348 10*3/uL (ref 150.0–400.0)
RBC: 3.16 Mil/uL — ABNORMAL LOW (ref 3.87–5.11)
RDW: 15.9 % — ABNORMAL HIGH (ref 11.5–15.5)
WBC: 12.2 10*3/uL — ABNORMAL HIGH (ref 4.0–10.5)

## 2022-07-15 LAB — IBC + FERRITIN
Ferritin: 194.6 ng/mL (ref 10.0–291.0)
Iron: 101 ug/dL (ref 42–145)
Saturation Ratios: 26 % (ref 20.0–50.0)
TIBC: 389.2 ug/dL (ref 250.0–450.0)
Transferrin: 278 mg/dL (ref 212.0–360.0)

## 2022-07-16 ENCOUNTER — Inpatient Hospital Stay (HOSPITAL_COMMUNITY)
Admission: EM | Admit: 2022-07-16 | Discharge: 2022-07-21 | DRG: 683 | Disposition: A | Discharge status: Home / Self Care | Payer: Medicare Other | Attending: Family Medicine | Admitting: Family Medicine

## 2022-07-16 ENCOUNTER — Encounter (HOSPITAL_COMMUNITY): Payer: Self-pay

## 2022-07-16 ENCOUNTER — Telehealth: Payer: Self-pay | Admitting: *Deleted

## 2022-07-16 ENCOUNTER — Emergency Department (HOSPITAL_COMMUNITY): Payer: Medicare Other

## 2022-07-16 ENCOUNTER — Other Ambulatory Visit: Payer: Self-pay

## 2022-07-16 DIAGNOSIS — E213 Hyperparathyroidism, unspecified: Secondary | ICD-10-CM | POA: Diagnosis present

## 2022-07-16 DIAGNOSIS — E669 Obesity, unspecified: Secondary | ICD-10-CM | POA: Diagnosis present

## 2022-07-16 DIAGNOSIS — K219 Gastro-esophageal reflux disease without esophagitis: Secondary | ICD-10-CM | POA: Diagnosis present

## 2022-07-16 DIAGNOSIS — D509 Iron deficiency anemia, unspecified: Secondary | ICD-10-CM | POA: Diagnosis present

## 2022-07-16 DIAGNOSIS — Z87891 Personal history of nicotine dependence: Secondary | ICD-10-CM | POA: Diagnosis not present

## 2022-07-16 DIAGNOSIS — F32A Depression, unspecified: Secondary | ICD-10-CM | POA: Diagnosis not present

## 2022-07-16 DIAGNOSIS — Z8249 Family history of ischemic heart disease and other diseases of the circulatory system: Secondary | ICD-10-CM | POA: Diagnosis not present

## 2022-07-16 DIAGNOSIS — I10 Essential (primary) hypertension: Secondary | ICD-10-CM | POA: Diagnosis present

## 2022-07-16 DIAGNOSIS — N184 Chronic kidney disease, stage 4 (severe): Secondary | ICD-10-CM | POA: Diagnosis not present

## 2022-07-16 DIAGNOSIS — E860 Dehydration: Secondary | ICD-10-CM | POA: Diagnosis not present

## 2022-07-16 DIAGNOSIS — Z8719 Personal history of other diseases of the digestive system: Secondary | ICD-10-CM | POA: Diagnosis not present

## 2022-07-16 DIAGNOSIS — E8729 Other acidosis: Secondary | ICD-10-CM | POA: Diagnosis present

## 2022-07-16 DIAGNOSIS — N179 Acute kidney failure, unspecified: Principal | ICD-10-CM | POA: Diagnosis present

## 2022-07-16 DIAGNOSIS — H9192 Unspecified hearing loss, left ear: Secondary | ICD-10-CM | POA: Diagnosis present

## 2022-07-16 DIAGNOSIS — Z932 Ileostomy status: Secondary | ICD-10-CM

## 2022-07-16 DIAGNOSIS — Z6831 Body mass index (BMI) 31.0-31.9, adult: Secondary | ICD-10-CM | POA: Diagnosis not present

## 2022-07-16 DIAGNOSIS — R112 Nausea with vomiting, unspecified: Secondary | ICD-10-CM | POA: Diagnosis not present

## 2022-07-16 DIAGNOSIS — Z882 Allergy status to sulfonamides status: Secondary | ICD-10-CM

## 2022-07-16 DIAGNOSIS — E785 Hyperlipidemia, unspecified: Secondary | ICD-10-CM | POA: Diagnosis not present

## 2022-07-16 DIAGNOSIS — D631 Anemia in chronic kidney disease: Secondary | ICD-10-CM | POA: Diagnosis not present

## 2022-07-16 DIAGNOSIS — Z885 Allergy status to narcotic agent status: Secondary | ICD-10-CM

## 2022-07-16 DIAGNOSIS — D72829 Elevated white blood cell count, unspecified: Secondary | ICD-10-CM | POA: Diagnosis present

## 2022-07-16 DIAGNOSIS — N189 Chronic kidney disease, unspecified: Secondary | ICD-10-CM | POA: Diagnosis not present

## 2022-07-16 DIAGNOSIS — K5080 Crohn's disease of both small and large intestine without complications: Secondary | ICD-10-CM | POA: Diagnosis present

## 2022-07-16 DIAGNOSIS — I129 Hypertensive chronic kidney disease with stage 1 through stage 4 chronic kidney disease, or unspecified chronic kidney disease: Secondary | ICD-10-CM | POA: Diagnosis present

## 2022-07-16 DIAGNOSIS — E872 Acidosis, unspecified: Secondary | ICD-10-CM | POA: Diagnosis not present

## 2022-07-16 DIAGNOSIS — K90822 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: Secondary | ICD-10-CM | POA: Diagnosis present

## 2022-07-16 DIAGNOSIS — G2581 Restless legs syndrome: Secondary | ICD-10-CM | POA: Diagnosis present

## 2022-07-16 DIAGNOSIS — Z833 Family history of diabetes mellitus: Secondary | ICD-10-CM

## 2022-07-16 DIAGNOSIS — Z79899 Other long term (current) drug therapy: Secondary | ICD-10-CM

## 2022-07-16 DIAGNOSIS — R198 Other specified symptoms and signs involving the digestive system and abdomen: Secondary | ICD-10-CM | POA: Diagnosis not present

## 2022-07-16 DIAGNOSIS — E1122 Type 2 diabetes mellitus with diabetic chronic kidney disease: Secondary | ICD-10-CM | POA: Diagnosis not present

## 2022-07-16 DIAGNOSIS — K5090 Crohn's disease, unspecified, without complications: Secondary | ICD-10-CM | POA: Diagnosis not present

## 2022-07-16 DIAGNOSIS — R197 Diarrhea, unspecified: Secondary | ICD-10-CM | POA: Diagnosis not present

## 2022-07-16 DIAGNOSIS — R1110 Vomiting, unspecified: Secondary | ICD-10-CM | POA: Diagnosis not present

## 2022-07-16 LAB — CBC WITH DIFFERENTIAL/PLATELET
Abs Immature Granulocytes: 0.06 10*3/uL (ref 0.00–0.07)
Basophils Absolute: 0 10*3/uL (ref 0.0–0.1)
Basophils Relative: 0 %
Eosinophils Absolute: 1.8 10*3/uL — ABNORMAL HIGH (ref 0.0–0.5)
Eosinophils Relative: 11 %
HCT: 38.7 % (ref 36.0–46.0)
Hemoglobin: 12.3 g/dL (ref 12.0–15.0)
Immature Granulocytes: 0 %
Lymphocytes Relative: 9 %
Lymphs Abs: 1.6 10*3/uL (ref 0.7–4.0)
MCH: 33.8 pg (ref 26.0–34.0)
MCHC: 31.8 g/dL (ref 30.0–36.0)
MCV: 106.3 fL — ABNORMAL HIGH (ref 80.0–100.0)
Monocytes Absolute: 0.8 10*3/uL (ref 0.1–1.0)
Monocytes Relative: 5 %
Neutro Abs: 12.5 10*3/uL — ABNORMAL HIGH (ref 1.7–7.7)
Neutrophils Relative %: 75 %
Platelets: 362 10*3/uL (ref 150–400)
RBC: 3.64 MIL/uL — ABNORMAL LOW (ref 3.87–5.11)
RDW: 14.9 % (ref 11.5–15.5)
WBC: 16.8 10*3/uL — ABNORMAL HIGH (ref 4.0–10.5)
nRBC: 0 % (ref 0.0–0.2)

## 2022-07-16 LAB — URINALYSIS, ROUTINE W REFLEX MICROSCOPIC
Bilirubin Urine: NEGATIVE
Glucose, UA: NEGATIVE mg/dL
Hgb urine dipstick: NEGATIVE
Ketones, ur: NEGATIVE mg/dL
Nitrite: NEGATIVE
Protein, ur: 100 mg/dL — AB
Specific Gravity, Urine: 1.014 (ref 1.005–1.030)
pH: 5 (ref 5.0–8.0)

## 2022-07-16 LAB — COMPREHENSIVE METABOLIC PANEL
ALT: 40 U/L — ABNORMAL HIGH (ref 0–35)
ALT: 46 U/L — ABNORMAL HIGH (ref 0–44)
AST: 37 U/L (ref 0–37)
AST: 37 U/L (ref 15–41)
Albumin: 4.1 g/dL (ref 3.5–5.2)
Albumin: 4.2 g/dL (ref 3.5–5.0)
Alkaline Phosphatase: 91 U/L (ref 39–117)
Alkaline Phosphatase: 90 U/L (ref 38–126)
Anion gap: 16 — ABNORMAL HIGH (ref 5–15)
BUN: 39 mg/dL — ABNORMAL HIGH (ref 6–23)
BUN: 56 mg/dL — ABNORMAL HIGH (ref 8–23)
CO2: 25 mEq/L (ref 19–32)
CO2: 18 mmol/L — ABNORMAL LOW (ref 22–32)
Calcium: 7.7 mg/dL — ABNORMAL LOW (ref 8.4–10.5)
Calcium: 8.6 mg/dL — ABNORMAL LOW (ref 8.9–10.3)
Chloride: 97 mEq/L (ref 96–112)
Chloride: 102 mmol/L (ref 98–111)
Creatinine, Ser: 4.27 mg/dL — ABNORMAL HIGH (ref 0.40–1.20)
Creatinine, Ser: 5.06 mg/dL — ABNORMAL HIGH (ref 0.44–1.00)
GFR, Estimated: 9 mL/min — ABNORMAL LOW (ref >60)
GFR: 10.14 mL/min — CL (ref >60.00)
Glucose, Bld: 133 mg/dL — ABNORMAL HIGH (ref 70–99)
Glucose, Bld: 122 mg/dL — ABNORMAL HIGH (ref 70–99)
Potassium: 3.9 mEq/L (ref 3.5–5.1)
Potassium: 4.7 mmol/L (ref 3.5–5.1)
Sodium: 136 mEq/L (ref 135–145)
Sodium: 136 mmol/L (ref 135–145)
Total Bilirubin: 0.4 mg/dL (ref 0.2–1.2)
Total Bilirubin: 0.7 mg/dL (ref 0.3–1.2)
Total Protein: 7.4 g/dL (ref 6.0–8.3)
Total Protein: 9.2 g/dL — ABNORMAL HIGH (ref 6.5–8.1)

## 2022-07-16 MED ORDER — LACTATED RINGERS IV BOLUS
1000.0000 mL | Freq: Once | INTRAVENOUS | Status: AC
  Administered 2022-07-16: 1000 mL via INTRAVENOUS

## 2022-07-16 MED ORDER — SODIUM BICARBONATE 650 MG PO TABS
650.0000 mg | ORAL_TABLET | Freq: Three times a day (TID) | ORAL | Status: DC
  Administered 2022-07-16 – 2022-07-21 (×16): 650 mg via ORAL
  Filled 2022-07-16 (×19): qty 1

## 2022-07-16 MED ORDER — PROCHLORPERAZINE EDISYLATE 10 MG/2ML IJ SOLN: 10.0000 mg | Freq: Four times a day (QID) | INTRAMUSCULAR | Status: DC | PRN

## 2022-07-16 MED ORDER — LACTATED RINGERS IV SOLN: INTRAVENOUS | Status: DC

## 2022-07-16 MED ORDER — CHOLESTYRAMINE LIGHT 4 G PO PACK
4.0000 g | PACK | Freq: Three times a day (TID) | ORAL | Status: DC
  Administered 2022-07-16 – 2022-07-21 (×12): 4 g via ORAL
  Filled 2022-07-16 (×15): qty 1

## 2022-07-16 NOTE — ED Provider Triage Note (Signed)
Emergency Medicine Provider Triage Evaluation Note  [name] , a 66 y.o. female  was evaluated in triage.  Pt complains of nausea, vomiting, increased belching, and changes in bowel movements.  Recently treated for UTI, has finished her course of antibiotics, without urinary symptoms at this time.  Was supposed to go to a [facility] to get an infusion today, however was turned away due to the nausea and vomiting and recommended to come to the ED.  Currently being recommended to get a fluid to help infusions "save the kidneys" as she has had Hx of CKD stage IV.  No Hx of diabetes.  Patient states she feels dehydrated.  Vomiting started this morning.  Felt mildly lightheaded yesterday after getting her blood drawn at the doctor's office.  Does not have a rectum, everything is empty through her stoma into a bag.  Has noted that the consistency is changed, becoming much more watery and pale yellow, and states she has been unable to see the food contents that she knows she ate.  Denies fevers, chills, chest pain, shortness of breath, abdominal pain, or changes in urinary habits.  Review of Systems  Positive:  Negative: See above  Physical Exam  BP (!) 93/52   Pulse (!) 122   Temp 98.3 F (36.8 C) (Oral)   Resp 16   Ht 5' 3"  (1.6 m)   Wt 81.4 kg   SpO2 98%   BMI 31.79 kg/m  Gen:   Awake, no distress, sitting in wheelchair Resp:  Normal effort  MSK:   Moves extremities without difficulty  Other:  Stoma bag contents clearish yellow with very small white pieces floating around.  Abdomen soft, nontender.  Not diaphoretic.  No flank pain.  Medical Decision Making  Medically screening exam initiated at 11:28 AM.  Appropriate orders placed.  [name] was informed that the remainder of the evaluation will be completed by another provider, this initial triage assessment does not replace that evaluation, and the importance of remaining in the ED until their evaluation is complete.      Jeanette Rome, PA-C 29/24/46 1132

## 2022-07-16 NOTE — Telephone Encounter (Signed)
Advised patient about lab work. She is vomiting and have fluid coming through her bag.  She is headed to the ER now to be evaluated.

## 2022-07-16 NOTE — H&P (Signed)
History and Physical    Patient: [name] PRF:[id number] DOB: Nov 14, 1955 DOA: 07/16/2022 DOS: the patient was seen and examined on 07/16/2022 PCP: Ann Held, DO  Patient coming from: Home  Chief Complaint:  Chief Complaint  Patient presents with   Emesis   HPI: [name] is a 66 y.o. female with medical history significant of CKD 4, Crohn's w/ ileostomy, GERD, HTN. Presenting with hypotension, N/V. She was discharged from the hospital on 07/12/22 after a stay for syncope. She was found to be orthostatic and with AKI likely from high output from her ileostomy. She recovered with fluid hydration. She was doing ok at home until this morning. She started having N/V. She felt weak. With her recent admissions fresh in her mind, she thought she may need to come to the ED to be evaluated. She denies any fevers, abdominal pain, sick contacts. She reports that she continues to have high output from her ostomy. She denies any other aggravating or alleviating factors.   Review of Systems: As mentioned in the history of present illness. All other systems reviewed and are negative. Past Medical History:  Diagnosis Date   Abdominal pain    Hx   Allergy    Anal stenosis    Anemia    Anxiety    Arthritis    Asthma    patient does not have inhaler   Blood in stool    Hx   Blood in urine    Hx   Blood transfusion without reported diagnosis    Cataract    CKD (chronic kidney disease) stage 3, GFR 30-59 ml/min (HCC)    Crohn's colitis, other complication (HCC)    De Quervain's tenosynovitis    Depression    Difficulty urinating    Hx   Easy bruising    Esophagitis    Fainting    History - resolved - due to dehydration   Fatigue    Hx   Fibroid    Hx   Gastric polyp    GERD (gastroesophageal reflux disease)    Hearing loss    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   Hemorrhoids, external    Hemorrhoids, internal    Herpes, genital    vaginal treated 07/05/12 and  pt states is resolved   History of cervical dysplasia    History of small bowel obstruction    Hyperlipidemia    currently no meds   Hyperparathyroidism    Hypertension    Hypokalemia    Hx   IBD (inflammatory bowel disease)    initially colectomy for suspected UC, 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   Obesity    Ovarian cyst    Poor dental hygiene    Pulmonary nodule, right    right upper lobe   Rectal bleeding    Hx   Rectal pain    Hx   Renal insufficiency    CKD - stage 3   RLS (restless legs syndrome)    no meds   Tooth infection 11/2016   right low   Ulcerative colitis    Visual disturbance    wears glasses   Weakness generalized    Hx - patient denies generalized weakness   Wears dentures    upper only   Past Surgical History:  Procedure Laterality Date   ANAL DILATION     CERVICAL BIOPSY  W/ LOOP ELECTRODE EXCISION  CHOLECYSTECTOMY     COLONOSCOPY     [name]   fatty tumor removed from back     X 2   HEMORRHOID SURGERY     ILEOSTOMY CLOSURE     RESTORATIVE PROCTOCOLECTOMY     with insertion of ileoanal J Pouch with loop ileostomy   SHOULDER ARTHROSCOPY WITH CAPSULORRHAPHY Left 06/14/2019   Procedure: LEFT SHOULDER ARTHRSCOPIC REPAIR OF BONY BANKART FRACTURE;  Surgeon: Tania Ade, MD;  Location: [facility];  Service: Orthopedics;  Laterality: Left;  REQUEST 90 MINUTE   SIGMOIDOSCOPY     TOTAL ABDOMINAL HYSTERECTOMY  1998   TAH/LSO   TUBAL LIGATION     UPPER GASTROINTESTINAL ENDOSCOPY     [name]   Social History:  reports that she quit smoking about 43 years ago. Her smoking use included cigarettes. She has a 4.00 pack-year smoking history. She has never used smokeless tobacco. She reports that she does not drink alcohol and does not use drugs.  Allergies  Allergen Reactions   Sulfa Antibiotics Hives, Itching, Rash and Swelling   Morphine Other (See Comments)    "Gives me crazy dreams" (delusions, also)   Sulfonamide  Derivatives Hives, Itching and Swelling    Family History  Problem Relation Age of Onset   Hypertension Mother    Heart disease Mother        s/p pci   Ulcerative colitis Father    Hypertension Father    Heart attack Father    Diabetes Sister    Cancer Sister        uterine   Pulmonary fibrosis Sister    Ulcerative colitis Daughter    Cancer Maternal Uncle        LUNG   Irritable bowel syndrome Other        grandchildren   Colon cancer Neg Hx    Esophageal cancer Neg Hx    Stomach cancer Neg Hx    Rectal cancer Neg Hx     Prior to Admission medications   Medication Sig Start Date End Date Taking? Authorizing Provider  acetaminophen (TYLENOL) 500 MG tablet Take 1,000 mg by mouth at bedtime as needed (for pain/sleep).    [provider]  allopurinol (ZYLOPRIM) 300 MG tablet TAKE ONE TABLET BY MOUTH DAILY AT 9AM Patient taking differently: Take 150 mg by mouth at bedtime. 07/05/22   Roma Schanz R, DO  amLODipine (NORVASC) 5 MG tablet Take 5 mg by mouth daily. 05/20/22   [provider]  Calcium Carbonate Antacid (TUMS EXTRA STRENGTH 750 PO) Take 2 tablets by mouth daily as needed (heartburn).    [provider]  diphenhydrAMINE (BENADRYL ALLERGY) 25 MG tablet Take 25 mg by mouth at bedtime.    [provider]  DULoxetine (CYMBALTA) 60 MG capsule TAKE TWO CAPSULES (120MG) BY MOUTH DAILY AT 9AM Patient taking differently: Take 120 mg by mouth daily. 07/05/22   Ann Held, DO  folic acid (FOLVITE) 1 MG tablet Take 2 tablets (2 mg total) by mouth daily. Patient taking differently: Take 1 mg by mouth daily. 05/26/17   [name], MD  GAS-X EXTRA STRENGTH 125 MG CAPS Take 125 mg by mouth at bedtime.    [provider]  loperamide (IMODIUM A-D) 2 MG tablet Take 4 mg by mouth at bedtime.    [provider]  metoprolol succinate (TOPROL-XL) 100 MG 24 hr tablet Take 50-100 mg by mouth See admin instructions. Take  100 mg by mouth in the  morning and 50 mg at bedtime    [provider]  OVER THE COUNTER MEDICATION Take 1 tablet by mouth See admin instructions. Nervive Nerve Health tablet- Take 1 tablet by mouth at bedtime    [provider]  Vitamin D, Ergocalciferol, (DRISDOL) 1.25 MG (50000 UNIT) CAPS capsule Take 50,000 Units by mouth every 30 (thirty) days.    [provider]    Physical Exam: Vitals:   07/16/22 1125 07/16/22 1256 07/16/22 1315 07/16/22 1345  BP: (!) 93/52 113/79 134/85 119/88  Pulse:  100 98 98  Resp:  18 16 18   Temp:      TempSrc:      SpO2:  95% 98% 97%  Weight: 81.4 kg     Height: 5' 3"  (1.6 m)      General: 66 y.o. female resting in bed in NAD Eyes: PERRL, normal sclera ENMT: Nares patent w/o discharge, orophaynx clear, dentition normal, ears w/o discharge/lesions/ulcers Neck: Supple, trachea midline Cardiovascular: tachy, +S1, S2, no m/g/r, equal pulses throughout Respiratory: CTABL, no w/r/r, normal WOB GI: BS+, NDNT, ileostomy present, no masses noted, no organomegaly noted MSK: No e/c/c Neuro: A&O x 3, no focal deficits Psyc: Appropriate interaction and affect, calm/cooperative  Data Reviewed:  Results for orders placed or performed during the hospital encounter of 07/16/22 (from the past 24 hour(s))  Comprehensive metabolic panel     Status: Abnormal   Collection Time: 07/16/22 11:58 AM  Result Value Ref Range   Sodium 136 135 - 145 mmol/L   Potassium 4.7 3.5 - 5.1 mmol/L   Chloride 102 98 - 111 mmol/L   CO2 18 (L) 22 - 32 mmol/L   Glucose, Bld 122 (H) 70 - 99 mg/dL   BUN 56 (H) 8 - 23 mg/dL   Creatinine, Ser 5.06 (H) 0.44 - 1.00 mg/dL   Calcium 8.6 (L) 8.9 - 10.3 mg/dL   Total Protein 9.2 (H) 6.5 - 8.1 g/dL   Albumin 4.2 3.5 - 5.0 g/dL   AST 37 15 - 41 U/L   ALT 46 (H) 0 - 44 U/L   Alkaline Phosphatase 90 38 - 126 U/L   Total Bilirubin 0.7 0.3 - 1.2 mg/dL   GFR, Estimated 9 (L) >60 mL/min   Anion gap 16 (H) 5 - 15  CBC  with Differential     Status: Abnormal   Collection Time: 07/16/22 11:58 AM  Result Value Ref Range   WBC 16.8 (H) 4.0 - 10.5 K/uL   RBC 3.64 (L) 3.87 - 5.11 MIL/uL   Hemoglobin 12.3 12.0 - 15.0 g/dL   HCT 38.7 36.0 - 46.0 %   MCV 106.3 (H) 80.0 - 100.0 fL   MCH 33.8 26.0 - 34.0 pg   MCHC 31.8 30.0 - 36.0 g/dL   RDW 14.9 11.5 - 15.5 %   Platelets 362 150 - 400 K/uL   nRBC 0.0 0.0 - 0.2 %   Neutrophils Relative % 75 %   Neutro Abs 12.5 (H) 1.7 - 7.7 K/uL   Lymphocytes Relative 9 %   Lymphs Abs 1.6 0.7 - 4.0 K/uL   Monocytes Relative 5 %   Monocytes Absolute 0.8 0.1 - 1.0 K/uL   Eosinophils Relative 11 %   Eosinophils Absolute 1.8 (H) 0.0 - 0.5 K/uL   Basophils Relative 0 %   Basophils Absolute 0.0 0.0 - 0.1 K/uL   Immature Granulocytes 0 %   Abs Immature Granulocytes 0.06 0.00 - 0.07 K/uL   *Note: Due to a  large number of results and/or encounters for the requested time period, some results have not been displayed. A complete set of results can be found in Results Review.   XR Abdomen General paucity of small bowel gas without evidence of distended or obstructed loops. Scattered gas in the distal colon to the rectum. No free air.  Assessment and Plan: AKI on CKD 4 High gap metabolic acidosis     - admit to inpt, tele     - fluids, bicarb     - AKI secondary to high output ileostomy and inability to keep up with fluid loss  N/V     - fluids, anti-emetics     - FLD, advance as tolerated  High output ileostomy Short bowel syndrome     - continue home imodium     - add questran  Hx of Crohn's     - continue home regimen  Chronic leukocytosis     - no fevers; no indication of infection     - trend for now  GERD     - PPI  Hx of hypertension     - she came in hypotensive, hold home regimen tonight   Advance Care Planning:   Code Status: DNI  Consults: Sidebarred nephrology (Dr. Joylene Grapes)  Family Communication: None at bedside  Severity of Illness: The  appropriate patient status for this patient is INPATIENT. Inpatient status is judged to be reasonable and necessary in order to provide the required intensity of service to ensure the patient's safety. The patient's presenting symptoms, physical exam findings, and initial radiographic and laboratory data in the context of their chronic comorbidities is felt to place them at high risk for further clinical deterioration. Furthermore, it is not anticipated that the patient will be medically stable for discharge from the hospital within 2 midnights of admission.   * I certify that at the point of admission it is my clinical judgment that the patient will require inpatient hospital care spanning beyond 2 midnights from the point of admission due to high intensity of service, high risk for further deterioration and high frequency of surveillance required.*  Author: Jonnie Finner, DO 07/16/2022 2:15 PM  For on call review [email or web address].

## 2022-07-16 NOTE — Progress Notes (Signed)
Labs have been faxed.

## 2022-07-16 NOTE — ED Provider Notes (Signed)
[facility] DEPT Provider Note   CSN: [id number] Arrival date & time: 07/16/22  1029     History  Chief Complaint  Patient presents with   Emesis    [name] is a 66 y.o. female.   Emesis Patient presents with dehydration due to high output.  Has a history of ulcerative colitis and has had previous colectomy.  Has ileostomy that is had a fair amount of output recently.  States a lot of yellow liquid is coming out.  Had been discharged on Tuesday has had 2 admissions for similar symptoms in the last month.  Thought to be high output failure.  Found to be hypotensive today with worsening kidney function. had outpatient labs drawn yesterday by PCP and 5 days ago creatinine was 3 and was up to4.3 yesterday.  States she feels dizzy.  Has had some nausea and vomiting.  States she ate 3 meals yesterday and has not noticed it coming out of her ileostomy.    Past Medical History:  Diagnosis Date   Abdominal pain    Hx   Allergy    Anal stenosis    Anemia    Anxiety    Arthritis    Asthma    patient does not have inhaler   Blood in stool    Hx   Blood in urine    Hx   Blood transfusion without reported diagnosis    Cataract    CKD (chronic kidney disease) stage 3, GFR 30-59 ml/min (HCC)    Crohn's colitis, other complication (HCC)    De Quervain's tenosynovitis    Depression    Difficulty urinating    Hx   Easy bruising    Esophagitis    Fainting    History - resolved - due to dehydration   Fatigue    Hx   Fibroid    Hx   Gastric polyp    GERD (gastroesophageal reflux disease)    Hearing loss    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   Hemorrhoids, external    Hemorrhoids, internal    Herpes, genital    vaginal treated 07/05/12 and pt states is resolved   History of cervical dysplasia    History of small bowel obstruction    Hyperlipidemia    currently no meds   Hyperparathyroidism    Hypertension    Hypokalemia    Hx    IBD (inflammatory bowel disease)    initially colectomy for suspected UC, 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 Authorizing Provider  acetaminophen (TYLENOL) 500 MG tablet Take 1,000 mg by mouth at bedtime as needed (for pain/sleep).    [provider]  allopurinol (ZYLOPRIM) 300 MG tablet  TAKE ONE TABLET BY MOUTH DAILY AT 9AM Patient taking differently: Take 150 mg by mouth at bedtime. 07/05/22   Roma Schanz R, DO  amLODipine (NORVASC) 5 MG tablet Take 5 mg by mouth daily. 05/20/22   [provider]  Calcium Carbonate Antacid (TUMS EXTRA STRENGTH 750 PO) Take 2 tablets by mouth daily as needed (heartburn).    [provider]  diphenhydrAMINE (BENADRYL ALLERGY) 25 MG tablet Take 25 mg by mouth at bedtime.    [provider]  DULoxetine (CYMBALTA) 60 MG capsule TAKE TWO CAPSULES (120MG) BY MOUTH DAILY AT 9AM Patient taking differently: Take 120 mg by mouth daily. 07/05/22   Ann Held, DO  folic acid (FOLVITE) 1 MG tablet Take 2 tablets (2 mg total) by mouth daily. Patient taking differently: Take 1 mg by mouth daily. 05/26/17   [name], MD  GAS-X EXTRA STRENGTH 125 MG CAPS Take 125 mg by mouth at bedtime.    [provider]  loperamide (IMODIUM A-D) 2 MG tablet Take 4 mg by mouth at bedtime.    [provider]  metoprolol succinate (TOPROL-XL) 100 MG 24 hr tablet Take 50-100 mg  by mouth See admin instructions. Take 100 mg by mouth in the morning and 50 mg at bedtime    [provider]  OVER THE COUNTER MEDICATION Take 1 tablet by mouth See admin instructions. Nervive Nerve Health tablet- Take 1 tablet by mouth at bedtime    [provider]  Vitamin D, Ergocalciferol, (DRISDOL) 1.25 MG (50000 UNIT) CAPS capsule Take 50,000 Units by mouth every 30 (thirty) days.    [provider]      Allergies    Sulfa antibiotics, Morphine, and Sulfonamide derivatives    Review of Systems   Review of Systems  Gastrointestinal:  Positive for vomiting.    Physical Exam Updated Vital Signs BP 113/79 (BP Location: Right Arm)   Pulse 100   Temp 98.3 F (36.8 C) (Oral)   Resp 18   Ht 5' 3"  (1.6 m)   Wt 81.4 kg   SpO2 95%   BMI 31.79 kg/m  Physical Exam Vitals and nursing note reviewed.  HENT:     Head: Normocephalic.  Eyes:     Pupils: Pupils are equal, round, and reactive to light.  Cardiovascular:     Rate and Rhythm: Regular rhythm.  Abdominal:     Comments:   Ileostomy right lower abdomen.  Full of liquid.  Minimal abdominal tenderness.  Musculoskeletal:        General: No tenderness.  Skin:    Capillary Refill: Capillary refill takes less than 2 seconds.  Neurological:     Mental Status: She is alert and oriented to person, place, and time.     ED Results / Procedures / Treatments   Labs (all labs ordered are listed, but only abnormal results are displayed) Labs Reviewed  COMPREHENSIVE METABOLIC PANEL - Abnormal; Notable for the following components:      Result Value   CO2 18 (*)    Glucose, Bld 122 (*)    BUN 56 (*)    Creatinine, Ser 5.06 (*)    Calcium 8.6 (*)    Total Protein 9.2 (*)    ALT 46 (*)    GFR, Estimated 9 (*)    Anion gap 16 (*)    All other components within normal limits  CBC WITH DIFFERENTIAL/PLATELET - Abnormal; Notable for the following components:   WBC  16.8 (*)    RBC 3.64 (*)    MCV 106.3 (*)     Neutro Abs 12.5 (*)    Eosinophils Absolute 1.8 (*)    All other components within normal limits  URINALYSIS, ROUTINE W REFLEX MICROSCOPIC    EKG None  Radiology No results found.  Procedures Procedures    Medications Ordered in ED Medications  lactated ringers bolus 1,000 mL (has no administration in time range)    ED Course/ Medical Decision Making/ A&P                           Medical Decision Making Amount and/or Complexity of Data Reviewed Radiology: ordered.   Patient with dehydration.  Differential diagnoses long but with patient is likely secondary to high output from her ileostomy.  Ileostomy due to Crohn's disease.  Feels lightheaded dizzy.  Initially hypotensive with pressures in the 80s.  Fluid bolus given now and blood pressure improved.  However creatinine has worsened.  Now up to 5 today up from 4.3 yesterday and 3 on discharge.  Sepsis felt less likely.  Had CT scan done with recent admission.  However with worsening kidney function will require readmission to hospital.  X-ray done and independently interpreted shows no obstruction.  Blood pressure now somewhat improved but still require admission.        Final Clinical Impression(s) / ED Diagnoses Final diagnoses:  AKI (acute kidney injury) [facility])    Rx / [place] Orders ED Discharge Orders     None         Davonna Belling, MD 07/16/22 1335

## 2022-07-16 NOTE — Telephone Encounter (Signed)
Dr Larose Kells -- please review in PCP's absence.  CRITICAL VALUE STICKER  CRITICAL VALUE:  GFR  10.14  RECEIVER (on-site recipient of call):  Kelle Darting, [place] NOTIFIED:  07/16/22  MESSENGER (representative from lab): [place]  MD NOTIFIED:  Etter Sjogren / Paz  TIME OF NOTIFICATION: 7:36am  RESPONSE:

## 2022-07-16 NOTE — Telephone Encounter (Signed)
Labs faxed to Nephrology.

## 2022-07-16 NOTE — Telephone Encounter (Signed)
Advise patient: Kidney function slightly lower than before. Wait for PCP advise In the meantime if she has nausea, vomiting, feeling unwell: She needs to reach out to the office

## 2022-07-16 NOTE — ED Triage Notes (Signed)
Pt recently admitted to hospital for dehydration. Reports vomiting since this morning and increase output on colostomy bag. Pt had apt in infusion center this morning for fluids but was advised to come to ED instead.

## 2022-07-17 DIAGNOSIS — N179 Acute kidney failure, unspecified: Secondary | ICD-10-CM | POA: Diagnosis not present

## 2022-07-17 LAB — COMPREHENSIVE METABOLIC PANEL
ALT: 36 U/L (ref 0–44)
AST: 27 U/L (ref 15–41)
Albumin: 3.6 g/dL (ref 3.5–5.0)
Alkaline Phosphatase: 79 U/L (ref 38–126)
Anion gap: 11 (ref 5–15)
BUN: 47 mg/dL — ABNORMAL HIGH (ref 8–23)
CO2: 21 mmol/L — ABNORMAL LOW (ref 22–32)
Calcium: 8.1 mg/dL — ABNORMAL LOW (ref 8.9–10.3)
Chloride: 103 mmol/L (ref 98–111)
Creatinine, Ser: 4.4 mg/dL — ABNORMAL HIGH (ref 0.44–1.00)
GFR, Estimated: 10 mL/min — ABNORMAL LOW (ref >60)
Glucose, Bld: 101 mg/dL — ABNORMAL HIGH (ref 70–99)
Potassium: 3.6 mmol/L (ref 3.5–5.1)
Sodium: 135 mmol/L (ref 135–145)
Total Bilirubin: 1 mg/dL (ref 0.3–1.2)
Total Protein: 7.8 g/dL (ref 6.5–8.1)

## 2022-07-17 LAB — CBC
HCT: 31.6 % — ABNORMAL LOW (ref 36.0–46.0)
Hemoglobin: 10.3 g/dL — ABNORMAL LOW (ref 12.0–15.0)
MCH: 34 pg (ref 26.0–34.0)
MCHC: 32.6 g/dL (ref 30.0–36.0)
MCV: 104.3 fL — ABNORMAL HIGH (ref 80.0–100.0)
Platelets: 302 10*3/uL (ref 150–400)
RBC: 3.03 MIL/uL — ABNORMAL LOW (ref 3.87–5.11)
RDW: 14.7 % (ref 11.5–15.5)
WBC: 19.4 10*3/uL — ABNORMAL HIGH (ref 4.0–10.5)
nRBC: 0 % (ref 0.0–0.2)

## 2022-07-17 MED ORDER — FOLIC ACID 1 MG PO TABS
1.0000 mg | ORAL_TABLET | Freq: Every day | ORAL | Status: DC
  Administered 2022-07-17 – 2022-07-21 (×5): 1 mg via ORAL
  Filled 2022-07-17 (×5): qty 1

## 2022-07-17 MED ORDER — LOPERAMIDE HCL 2 MG PO CAPS
4.0000 mg | ORAL_CAPSULE | Freq: Every day | ORAL | Status: DC
  Administered 2022-07-17 – 2022-07-20 (×5): 4 mg via ORAL
  Filled 2022-07-17 (×6): qty 2

## 2022-07-17 MED ORDER — DULOXETINE HCL 60 MG PO CPEP
120.0000 mg | ORAL_CAPSULE | Freq: Every day | ORAL | Status: DC
  Administered 2022-07-17 – 2022-07-21 (×5): 120 mg via ORAL
  Filled 2022-07-17: qty 2
  Filled 2022-07-17: qty 4
  Filled 2022-07-17 (×3): qty 2

## 2022-07-17 NOTE — Plan of Care (Signed)

## 2022-07-17 NOTE — Progress Notes (Addendum)
PROGRESS NOTE    [name]  GGY:[id number] DOB: 1956/03/01 DOA: 07/16/2022  PCP: Ann Held, DO    Brief Narrative:  This 66 years old female with PMH significant for CKD stage IV, Crohn's disease with ileostomy, short gut syndrome, GERD, hypertension presented in the ED with complaints of nausea, vomiting and hypotension.  She was recently discharged from hospital on 07/12/2022 after hospitalization for syncope. She was found to be orthostatic and with AKI on CKD 4 likely from high output from her ileostomy.  She recovered with fluid resuscitation and then she was discharged.  Patient reports she was doing better for few days and then she started having nausea and vomiting,  increased output from her ileostomy.  She denies any fever,  abdominal pain or sick contacts. Patient is admitted for AKI on CKD stage IV secondary to nausea,  vomiting and high output from ileostomy. Nephrology is consulted.  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 Likely from high output ileostomy and nausea and vomiting. She does have a history of short gut syndrome. X-ray shows paucity of small bowel gas without evidence of obstructed loops.,  No free air,  She is started on aggressive IV fluid resuscitation.  Continue sodium bicarbonate. Avoid nephrotoxic medication, follow-up serum creatinine.  Nausea and vomiting: Continue IV fluid resuscitation, Continue IV compazine Started on clear liquid diet,  advance as tolerated  High output  ileostomy/short gut syndrome: Continue Imodium. Continue Questran  Chronic leucocytosis: Denies any fever, no signs of any infection. Hold on antibiotics at this time.  GERD: Continue pantoprazole 40 mg daily  Essential hypertension: Hold blood pressure medications and blood pressure is on soft side.  DVT prophylaxis: SCDs Code Status: Full code Family Communication: No family at bed side Disposition Plan:   Status is: Inpatient Remains inpatient appropriate because: Admitted for nausea vomiting and high output from ileostomy leading to AKI on CKD stage IV.  Patient needs aggressive IV fluid resuscitation.  Nephrology is consulted    Consultants:  Nephrology  Procedures: None Antimicrobials: None  Subjective: Patient was seen and examined at bedside.  Overnight events noted.   Patient reports she still has nausea and vomiting and high output from ileostomy which is making her feel weak.  Objective: Vitals:   07/17/22 0900 07/17/22 0930 07/17/22 1000 07/17/22 1100  BP: 102/70 98/74 111/74 117/89  Pulse: (!) 115 (!) 115 (!) 112 (!) 116  Resp: 15 16 13 17   Temp:   98.8 F (37.1 C)   TempSrc:   Oral   SpO2: 98% 95% 99% 95%  Weight:      Height:       No intake or output data in the 24 hours ending 07/17/22 1146 Filed Weights   07/16/22 1125  Weight: 81.4 kg    Examination:  General exam: Appears comfortable, not in any acute distress.  Deconditioned Respiratory system: CTA bilaterally, respiratory effort normal, RR 15 Cardiovascular system: S1 & S2 heard, regular rate and rhythm, no murmur. Gastrointestinal  system: Abdomen is soft, mildly tender, nondistended, BS+, colostomy noted. Central nervous system: Alert and oriented x 3. No focal neurological deficits. Extremities: No edema, no cyanosis, no clubbing. Skin: No rashes, lesions or ulcers Psychiatry: Judgement and insight appear normal. Mood & affect appropriate.     Data Reviewed: I have personally  reviewed following labs and imaging studies  CBC: Recent Labs  Lab 07/11/22 0332 07/15/22 1235 07/16/22 1158 07/17/22 0446  WBC 15.7* 12.2* 16.8* 19.4*  NEUTROABS  --  6.9 12.5*  --   HGB 9.3* 10.7* 12.3 10.3*  HCT 29.8* 32.8* 38.7 31.6*  MCV 107.2* 103.7* 106.3* 104.3*  PLT 245 348.0 362 161   Basic Metabolic Panel: Recent Labs  Lab 07/11/22 0332 07/15/22 1235 07/16/22 1158 07/17/22 0446  NA 138 136 136 135  K 3.5 3.9 4.7 3.6  CL 100 97 102 103  CO2 28 25 18* 21*  GLUCOSE 96 133* 122* 101*  BUN 28* 39* 56* 47*  CREATININE 3.03* 4.27* 5.06* 4.40*  CALCIUM 7.2* 7.7* 8.6* 8.1*   GFR: Estimated Creatinine Clearance: 12.4 mL/min (A) (by C-G formula based on SCr of 4.4 mg/dL (H)). Liver Function Tests: Recent Labs  Lab 07/11/22 0332 07/15/22 1235 07/16/22 1158 07/17/22 0446  AST 29 37 37 27  ALT 23 40* 46* 36  ALKPHOS 67 91 90 79  BILITOT 0.5 0.4 0.7 1.0  PROT 6.6 7.4 9.2* 7.8  ALBUMIN 3.0* 4.1 4.2 3.6   No results for input(s): "LIPASE", "AMYLASE" in the last 168 hours. No results for input(s): "AMMONIA" in the last 168 hours. Coagulation Profile: No results for input(s): "INR", "PROTIME" in the last 168 hours. Cardiac Enzymes: No results for input(s): "CKTOTAL", "CKMB", "CKMBINDEX", "TROPONINI" in the last 168 hours. BNP (last 3 results) No results for input(s): "PROBNP" in the last 8760 hours. HbA1C: No results for input(s): "HGBA1C" in the last 72 hours. CBG: No results for input(s): "GLUCAP" in the last 168 hours. Lipid Profile: No results for input(s): "CHOL", "HDL", "LDLCALC", "TRIG", "CHOLHDL", "LDLDIRECT" in the last 72 hours. Thyroid Function Tests: No results for input(s): "TSH", "T4TOTAL", "FREET4", "T3FREE", "THYROIDAB" in the last 72 hours. Anemia Panel: Recent Labs    07/15/22 1235  FERRITIN 194.6  TIBC 389.2  IRON 101   Sepsis Labs: No results for input(s): "PROCALCITON", "LATICACIDVEN" in the last 168 hours.  Recent Results (from  the past 240 hour(s))  C Difficile Quick Screen w PCR reflex     Status: None   Collection Time: 07/08/22 10:30 AM   Specimen: STOOL  Result Value Ref Range Status   C Diff antigen NEGATIVE NEGATIVE Final   C Diff toxin NEGATIVE NEGATIVE Final   C Diff interpretation No C. difficile detected.  Final    Comment: Performed at [facility], [place] [address], [place], [place] [address]    Radiology Studies: DG Abd 2 Views  Result Date: 07/16/2022 CLINICAL DATA:  Vomiting EXAM: ABDOMEN - 2 VIEW COMPARISON:  None Available. FINDINGS: General paucity of small bowel gas without evidence of distended or obstructed loops. Scattered gas in the distal colon to the rectum. No free air. No radio-opaque calculi or other significant radiographic abnormality is seen. IMPRESSION: General paucity of small bowel gas without evidence of distended or obstructed loops. Scattered gas in the distal colon to the rectum. No free air. Electronically Signed   By: Delanna Ahmadi M.D.   On: 07/16/2022 13:26    Scheduled Meds:  cholestyramine light  4  g Oral TID PC   DULoxetine  120 mg Oral Daily   folic acid  1 mg Oral Daily   loperamide  4 mg Oral QHS   sodium bicarbonate  650 mg Oral TID   Continuous Infusions:  lactated ringers 125 mL/hr at 07/16/22 1657     LOS: 1 day    Time spent: 30 mins    Wanette Robison, MD [facility]   If 7PM-7AM, please contact night-coverage

## 2022-07-18 DIAGNOSIS — N179 Acute kidney failure, unspecified: Secondary | ICD-10-CM | POA: Diagnosis not present

## 2022-07-18 MED ORDER — METOPROLOL TARTRATE 12.5 MG HALF TABLET
12.5000 mg | ORAL_TABLET | Freq: Once | ORAL | Status: AC
  Administered 2022-07-18: 12.5 mg via ORAL
  Filled 2022-07-18: qty 1

## 2022-07-18 NOTE — Progress Notes (Signed)
PROGRESS NOTE    [name]  AYT:[id number] DOB: 08-Jun-1956 DOA: 07/16/2022  PCP: Ann Held, DO    Brief Narrative:  This 66 years old female with PMH significant for CKD stage IV, Crohn's disease with ileostomy, short gut syndrome, GERD, hypertension presented in the ED with complaints of nausea, vomiting and hypotension.  She was recently discharged from hospital on 07/12/2022 after hospitalization for syncope. She was found to be orthostatic and with AKI on CKD 4 likely from high output from her ileostomy.  She recovered with fluid resuscitation and then she was discharged.  Patient reports she was doing better for few days and then she started having nausea and vomiting,  increased output from her ileostomy.  She denies any fever,  abdominal pain or sick contacts. Patient is admitted for AKI on CKD stage IV secondary to nausea,  vomiting and high output from ileostomy. Nephrology is consulted.  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 Likely from high output ileostomy and nausea and vomiting. She does have a history of short gut syndrome. X-ray shows paucity of small bowel gas without evidence of obstructed loops.,  No free air,  She is started on aggressive IV fluid resuscitation.  Continue sodium bicarbonate. Avoid nephrotoxic medication, creatinine is improving. 5.06>4.40>  Nausea and vomiting: > Improving Continue IV fluid resuscitation, Continue IV compazine Started on clear liquid diet, advanced to full  liquid.  High output ileostomy/short gut syndrome: Continue Imodium. Continue Questran  Chronic leucocytosis: Denies any fever, no signs of any infection. Hold on antibiotics at this time.  GERD: Continue pantoprazole 40 mg daily  Essential hypertension: Hold blood pressure medications and blood pressure is on soft side.  DVT prophylaxis: SCDs Code Status: Full code Family Communication: No family at bed side Disposition Plan:   Status is: Inpatient Remains inpatient appropriate because: Admitted for nausea vomiting and high output from ileostomy leading to AKI on CKD stage IV.  Patient needs aggressive IV fluid resuscitation.  Nephrology is consulted    Consultants:  Nephrology  Procedures: None Antimicrobials: None  Subjective: Patient was seen and examined at bedside.  Overnight events noted. Patient reported still has nausea but has much improved.  She still has high output from ileostomy. She tolerated clear liquid diet,  advanced to full liquids today.  Renal functions are improving  Objective: Vitals:   07/17/22 1239 07/17/22 1439 07/17/22 2119 07/18/22 0550  BP: (!) 153/84 119/85 (!) 145/88 (!) 145/90  Pulse: (!) 108 (!) 110 (!) 108 (!) 107  Resp: 20 16    Temp: 97.9 F (36.6 C) 97.9 F (36.6 C) 98.1 F (36.7 C) 97.6 F (36.4 C)  TempSrc: Oral Oral Oral Oral  SpO2: 97% 99% 98% 98%  Weight:      Height:        Intake/Output Summary (Last 24 hours) at 07/18/2022 1132 Last data filed at 07/18/2022 0300 Gross per 24 hour  Intake 1724.53 ml  Output --  Net 1724.53 ml   Danley Danker  Weights   07/16/22 1125  Weight: 81.4 kg    Examination:  General exam: Appears comfortable, not in any acute distress, deconditioned. Respiratory system: CTA bilaterally, respiratory effort normal, RR 15. Cardiovascular system: S1 & S2 heard, regular rate and rhythm, no murmur. Gastrointestinal system: Abdomen is soft, non tender, non distended, BS+,ileostomy+ Central  nervous system: Alert and oriented x3, no focal neurological deficits. Extremities: No edema, no cyanosis, no clubbing. Skin: No rashes, lesions or ulcers Psychiatry: Judgement and insight appear normal. Mood & affect appropriate.     Data Reviewed: I have personally reviewed following labs and imaging studies  CBC: Recent Labs  Lab 07/15/22 1235 07/16/22 1158 07/17/22 0446  WBC 12.2* 16.8* 19.4*  NEUTROABS 6.9 12.5*  --   HGB 10.7* 12.3 10.3*  HCT 32.8* 38.7 31.6*  MCV 103.7* 106.3* 104.3*  PLT 348.0 362 277   Basic Metabolic Panel: Recent Labs  Lab 07/15/22 1235 07/16/22 1158 07/17/22 0446  NA 136 136 135  K 3.9 4.7 3.6  CL 97 102 103  CO2 25 18* 21*  GLUCOSE 133* 122* 101*  BUN 39* 56* 47*  CREATININE 4.27* 5.06* 4.40*  CALCIUM 7.7* 8.6* 8.1*   GFR: Estimated Creatinine Clearance: 12.4 mL/min (A) (by C-G formula based on SCr of 4.4 mg/dL (H)). Liver Function Tests: Recent Labs  Lab 07/15/22 1235 07/16/22 1158 07/17/22 0446  AST 37 37 27  ALT 40* 46* 36  ALKPHOS 91 90 79  BILITOT 0.4 0.7 1.0  PROT 7.4 9.2* 7.8  ALBUMIN 4.1 4.2 3.6   No results for input(s): "LIPASE", "AMYLASE" in the last 168 hours. No results for input(s): "AMMONIA" in the last 168 hours. Coagulation Profile: No results for input(s): "INR", "PROTIME" in the last 168 hours. Cardiac Enzymes: No results for input(s): "CKTOTAL", "CKMB", "CKMBINDEX", "TROPONINI" in the last 168 hours. BNP (last 3 results) No results for input(s): "PROBNP" in the last 8760 hours. HbA1C: No results for input(s): "HGBA1C" in the last 72 hours. CBG: No results for input(s): "GLUCAP" in the last 168 hours. Lipid Profile: No results for input(s): "CHOL", "HDL", "LDLCALC", "TRIG", "CHOLHDL", "LDLDIRECT" in the last 72 hours. Thyroid Function Tests: No results for input(s): "TSH", "T4TOTAL", "FREET4", "T3FREE", "THYROIDAB" in the last 72 hours. Anemia Panel: Recent Labs    07/15/22 1235  FERRITIN 194.6   TIBC 389.2  IRON 101   Sepsis Labs: No results for input(s): "PROCALCITON", "LATICACIDVEN" in the last 168 hours.  No results found for this or any previous visit (from the past 240 hour(s)).   Radiology Studies: DG Abd 2 Views  Result Date: 07/16/2022 CLINICAL DATA:  Vomiting EXAM: ABDOMEN - 2 VIEW COMPARISON:  None Available. FINDINGS: General paucity of small bowel gas without evidence of distended or obstructed loops. Scattered gas in the distal colon to the rectum. No free air. No radio-opaque calculi or other significant radiographic abnormality is seen. IMPRESSION: General paucity of small bowel gas without evidence of distended or obstructed loops. Scattered gas in the distal colon to the rectum. No free air. Electronically Signed   By: Delanna Ahmadi M.D.   On: 07/16/2022 13:26    Scheduled Meds:  cholestyramine light  4 g Oral TID PC   DULoxetine  120 mg Oral Daily   folic acid  1 mg Oral Daily   loperamide  4 mg Oral QHS   sodium bicarbonate  650 mg Oral TID   Continuous Infusions:  lactated ringers 125 mL/hr at 07/17/22 2113  LOS: 2 days    Time spent: 35 mins    Tanairy Payeur, MD [facility]   If 7PM-7AM, please contact night-coverage

## 2022-07-18 NOTE — TOC Progression Note (Signed)
Transition of Care [facility]) - Progression Note    Patient Details  Name: [name] MRN: [id number] Date of Birth: May 05, 1956  Transition of Care [facility]) CM/SW Contact  Henrietta Dine, RN Phone Number: 07/18/2022, 9:54 AM  Clinical Narrative:     Transition of Care [facility]) Screening Note   Patient Details  Name: [name] Date of Birth: 03/15/1956   Transition of Care [facility]) CM/SW Contact:    Henrietta Dine, RN Phone Number: 07/18/2022, 9:54 AM    Transition of Care Department [facility]) has reviewed patient and no TOC needs have been identified at this time. We will continue to monitor patient advancement through interdisciplinary progression rounds. If new patient transition needs arise, please place a TOC consult.          Expected Discharge Plan and Services                                                 Social Determinants of Health (SDOH) Interventions    Readmission Risk Interventions    05/15/2021    1:42 PM 12/10/2020   10:57 AM  Readmission Risk Prevention Plan  Transportation Screening Complete Complete  PCP or Specialist Appt within 3-5 Days  Complete  HRI or Home Care Consult Complete Complete  Social Work Consult for [place] Planning/Counseling Complete Complete  Palliative Care Screening Not Applicable Not Applicable  Medication Review [employer])  Complete

## 2022-07-19 DIAGNOSIS — N179 Acute kidney failure, unspecified: Secondary | ICD-10-CM | POA: Diagnosis not present

## 2022-07-19 DIAGNOSIS — R112 Nausea with vomiting, unspecified: Secondary | ICD-10-CM

## 2022-07-19 LAB — CBC
HCT: 29.5 % — ABNORMAL LOW (ref 36.0–46.0)
Hemoglobin: 9.5 g/dL — ABNORMAL LOW (ref 12.0–15.0)
MCH: 33.9 pg (ref 26.0–34.0)
MCHC: 32.2 g/dL (ref 30.0–36.0)
MCV: 105.4 fL — ABNORMAL HIGH (ref 80.0–100.0)
Platelets: 317 10*3/uL (ref 150–400)
RBC: 2.8 MIL/uL — ABNORMAL LOW (ref 3.87–5.11)
RDW: 14.6 % (ref 11.5–15.5)
WBC: 10.9 10*3/uL — ABNORMAL HIGH (ref 4.0–10.5)
nRBC: 0 % (ref 0.0–0.2)

## 2022-07-19 LAB — BASIC METABOLIC PANEL
Anion gap: 12 (ref 5–15)
BUN: 30 mg/dL — ABNORMAL HIGH (ref 8–23)
CO2: 26 mmol/L (ref 22–32)
Calcium: 8.4 mg/dL — ABNORMAL LOW (ref 8.9–10.3)
Chloride: 102 mmol/L (ref 98–111)
Creatinine, Ser: 2.92 mg/dL — ABNORMAL HIGH (ref 0.44–1.00)
GFR, Estimated: 17 mL/min — ABNORMAL LOW (ref >60)
Glucose, Bld: 100 mg/dL — ABNORMAL HIGH (ref 70–99)
Potassium: 3.5 mmol/L (ref 3.5–5.1)
Sodium: 140 mmol/L (ref 135–145)

## 2022-07-19 LAB — C-REACTIVE PROTEIN: CRP: 0.7 mg/dL (ref <1.0)

## 2022-07-19 LAB — SEDIMENTATION RATE: Sed Rate: 60 mm/hr — ABNORMAL HIGH (ref 0–22)

## 2022-07-19 MED ORDER — METOPROLOL SUCCINATE ER 50 MG PO TB24
50.0000 mg | ORAL_TABLET | Freq: Two times a day (BID) | ORAL | Status: DC
  Administered 2022-07-19 – 2022-07-21 (×4): 50 mg via ORAL
  Filled 2022-07-19 (×4): qty 1

## 2022-07-19 MED ORDER — AMLODIPINE BESYLATE 5 MG PO TABS
5.0000 mg | ORAL_TABLET | Freq: Every day | ORAL | Status: DC
  Administered 2022-07-19 – 2022-07-21 (×3): 5 mg via ORAL
  Filled 2022-07-19 (×3): qty 1

## 2022-07-19 NOTE — Progress Notes (Signed)
Pt HR 170s when ambulating and showing AFIB on monitor. MD aware. Pt reports no distress and resting comfortably in bed with call light in place

## 2022-07-19 NOTE — Assessment & Plan Note (Signed)
Will arrange for Ivf 2x a week Recheck labs  Go to er if symptoms worsen

## 2022-07-19 NOTE — Progress Notes (Signed)
Pt. Had gotten up to use the bathroom, with nurse tech at side, per pt. Noticed that her heart rate went into the 130's- sinus tach, when this occurred. Pt. Is back in bed resting, warm, dry, no visible distress. No c/o pain or discomfort. Heart rate is down to 92 bpm at present time. Will continue to monitor.

## 2022-07-19 NOTE — Assessment & Plan Note (Signed)
Recheck labs today Return to er if symtpoms worsen

## 2022-07-19 NOTE — Assessment & Plan Note (Signed)
Per GI

## 2022-07-19 NOTE — H&P (View-Only) (Signed)
  Consultation  Referring Provider: [facility]/ Kumar Primary Care Physician:  Lowne Chase, Yvonne R, DO Primary Gastroenterologist:  Dr. Armbruster  Reason for Consultation: Recurrent nausea and vomiting with episodes of high ileostomy output  HPI: [name] is a 66 y.o. 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  She has been through Remicade, 6-MP, Humira which did not get her into remission and then developed high antibody titers, and most recently had been on Entyvio and low-dose 6-MP prior to undergoing surgery last year.  She has not been on any therapy since surgery.  Patient underwent a diverting ileostomy in August 2022 per Dr. Ashburn for severe pouchitis, then had her J-pouch excised with placement of end ileostomy in January 2023 per Dr. Ashburn. She was last seen in our office in May 2023, doing well at that time and with no evidence of high ileostomy output.  It was discussed with her that some sort of surveillance for IBD was indicated but she did not want to go through with ileoscopy at that point.  She did have a fecal calprotectin done which was normal. Patient did require IV fluids 3 times weekly for several months after her surgery and that was eventually discontinued. She has history of stage IV chronic kidney disease with baseline creatinine between 2 and 3, hypertension, chronic GERD, and has had several surgeries for IBD in addition to those outlined above. She is now on her third admission over the past couple of months with acute kidney injury.  She describes all 3 of these episodes as having onset of nausea and vomiting at home followed by a period of several hours of very high ileostomy output, followed by weakness etc.  She was admitted 06/29/2022 through 07/01/2022 with an episode and actually had syncope with that episode. That time had CT of the abdomen and  pelvis without contrast that showed no abnormal bowel dilation or inflammation She met SIRS criteria at that point but no infection was found. She was readmitted 10/31 to 07/12/2022 given with generalized weakness and a syncopal/presyncopal episode and creatinine up to 6.4.  Given patient says that with this episode she also had the nausea vomiting and several hours of high ileostomy output which then resolves. She does not get any abdominal pain or cramping with these episodes and once the episode resolves she goes back to her usual ileostomy output which she says will be about 150 cc 3-4 times daily. Patient was readmitted on 07/16/2022 with yet another episode. BUN 39/creatinine 4.27 BBC 12.2/hemoglobin 10.7/hematocrit 32.8/MCV 103 Iron studies were normal. No repeat CT but plain films showed no evidence of distended or obstructed loops  She has been able to advance her diet here without difficulty is currently on full liquids, is back to having her usual ileostomy output which is liquid and generally 150 to 200 cc 3-4 times daily which she empties.  She is not having any nausea or vomiting or abdominal pain  She is obviously afraid that she will go home and have recurrence of symptoms and is concerned about having a partial obstruction due to all of her prior surgeries.  She feels that her recurrent symptoms are consistent with partial obstruction which relieves itself.  Now amenable to ileoscopy if felt indicated . She also relates that her nephrologist/Dr. Coladonato has advised her that she should have IV fluids twice weekly as   an outpatient and was hoping to get that done through the infusion center/Dr. Armbruster.  At some point she had been started on Questran within the past month or so but that has not really changed these episodes or her output.    Past Medical History:  Diagnosis Date   Abdominal pain    Hx   Allergy    Anal stenosis    Anemia    Anxiety    Arthritis    Asthma     patient does not have inhaler   Blood in stool    Hx   Blood in urine    Hx   Blood transfusion without reported diagnosis    Cataract    CKD (chronic kidney disease) stage 3, GFR 30-59 ml/min (HCC)    Crohn's colitis, other complication (HCC)    De Quervain's tenosynovitis    Depression    Difficulty urinating    Hx   Easy bruising    Esophagitis    Fainting    History - resolved - due to dehydration   Fatigue    Hx   Fibroid    Hx   Gastric polyp    GERD (gastroesophageal reflux disease)    Hearing loss    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   Hemorrhoids, external    Hemorrhoids, internal    Herpes, genital    vaginal treated 07/05/12 and pt states is resolved   History of cervical dysplasia    History of small bowel obstruction    Hyperlipidemia    currently no meds   Hyperparathyroidism    Hypertension    Hypokalemia    Hx   IBD (inflammatory bowel disease)    initially colectomy for suspected UC, 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   Obesity    Ovarian cyst    Poor dental hygiene    Pulmonary nodule, right    right upper lobe   Rectal bleeding    Hx   Rectal pain    Hx   Renal insufficiency    CKD - stage 3   RLS (restless legs syndrome)    no meds   Tooth infection 11/2016   right low   Ulcerative colitis    Visual disturbance    wears glasses   Weakness generalized    Hx - patient denies generalized weakness   Wears dentures    upper only    Past Surgical History:  Procedure Laterality Date   ANAL DILATION     CERVICAL BIOPSY  W/ LOOP ELECTRODE EXCISION     CHOLECYSTECTOMY     COLONOSCOPY     [name]   fatty tumor removed from back     X 2   HEMORRHOID SURGERY     ILEOSTOMY CLOSURE     RESTORATIVE PROCTOCOLECTOMY     with insertion of ileoanal J Pouch with loop ileostomy   SHOULDER ARTHROSCOPY WITH CAPSULORRHAPHY Left 06/14/2019   Procedure: LEFT SHOULDER ARTHRSCOPIC REPAIR OF BONY BANKART FRACTURE;  Surgeon:  Chandler, Justin, MD;  Location: [facility];  Service: Orthopedics;  Laterality: Left;  REQUEST 90 MINUTE   SIGMOIDOSCOPY     TOTAL ABDOMINAL HYSTERECTOMY  1998   TAH/LSO   TUBAL LIGATION     UPPER GASTROINTESTINAL ENDOSCOPY     [name]    Prior to Admission medications   Medication Sig Start Date End Date Taking? Authorizing Provider  acetaminophen (TYLENOL) 500 MG tablet Take 1,000 mg by   mouth at bedtime as needed (for pain/sleep).   Yes [provider]  allopurinol (ZYLOPRIM) 300 MG tablet TAKE ONE TABLET BY MOUTH DAILY AT 9AM Patient taking differently: Take 150 mg by mouth at bedtime. 07/05/22  Yes Lowne Chase, Yvonne R, DO  amLODipine (NORVASC) 5 MG tablet Take 5 mg by mouth daily. 05/20/22  Yes [provider]  Calcium Carbonate Antacid (TUMS EXTRA STRENGTH 750 PO) Take 2 tablets by mouth daily as needed (CHEW for heartburn).   Yes [provider]  diphenhydrAMINE (BENADRYL ALLERGY) 25 MG tablet Take 25 mg by mouth at bedtime.   Yes [provider]  DULoxetine (CYMBALTA) 60 MG capsule TAKE TWO CAPSULES (120MG) BY MOUTH DAILY AT 9AM Patient taking differently: Take 120 mg by mouth daily. 07/05/22  Yes Lowne Chase, Yvonne R, DO  folic acid (FOLVITE) 1 MG tablet Take 2 tablets (2 mg total) by mouth daily. Patient taking differently: Take 1 mg by mouth daily. 05/26/17  Yes Ennever, Peter R, MD  GAS-X EXTRA STRENGTH 125 MG CAPS Take 125 mg by mouth at bedtime.   Yes [provider]  loperamide (IMODIUM A-D) 2 MG tablet Take 4 mg by mouth at bedtime.   Yes [provider]  metoprolol succinate (TOPROL-XL) 100 MG 24 hr tablet Take 50 mg by mouth See admin instructions. Take 50 mg by mouth in the morning and afternoon   Yes [provider]  Vitamin D, Ergocalciferol, (DRISDOL) 1.25 MG (50000 UNIT) CAPS capsule Take 50,000 Units by mouth every 30 (thirty) days.   Yes [provider]  OVER THE COUNTER MEDICATION Take 1 tablet by  mouth See admin instructions. Nervive Nerve Health tablet- Take 1 tablet by mouth at bedtime Patient not taking: Reported on 07/16/2022    [provider]    Current Facility-Administered Medications  Medication Dose Route Frequency Provider Last Rate Last Admin   amLODipine (NORVASC) tablet 5 mg  5 mg Oral Daily Kumar, Pardeep, MD   5 mg at 07/19/22 1308   cholestyramine light (PREVALITE) packet 4 g  4 g Oral TID PC Kyle, Tyrone A, DO   4 g at 07/19/22 1235   DULoxetine (CYMBALTA) DR capsule 120 mg  120 mg Oral Daily Kyle, Tyrone A, DO   120 mg at 07/19/22 0810   folic acid (FOLVITE) tablet 1 mg  1 mg Oral Daily Kyle, Tyrone A, DO   1 mg at 07/19/22 0811   lactated ringers infusion   Intravenous Continuous Kyle, Tyrone A, DO 125 mL/hr at 07/19/22 0357 New Bag at 07/19/22 0357   loperamide (IMODIUM) capsule 4 mg  4 mg Oral QHS Kyle, Tyrone A, DO   4 mg at 07/18/22 2141   metoprolol succinate (TOPROL-XL) 24 hr tablet 50 mg  50 mg Oral BID Kumar, Pardeep, MD       prochlorperazine (COMPAZINE) injection 10 mg  10 mg Intravenous Q6H PRN Kyle, Tyrone A, DO       sodium bicarbonate tablet 650 mg  650 mg Oral TID Kyle, Tyrone A, DO   650 mg at 07/19/22 0810    Allergies as of 07/16/2022 - Review Complete 07/16/2022  Allergen Reaction Noted   Sulfa antibiotics Hives, Itching, Rash, and Swelling 05/25/2007   Morphine Other (See Comments) 05/25/2007   Sulfonamide derivatives Hives, Itching, and Swelling 05/25/2007    Family History  Problem Relation Age of Onset   Hypertension Mother    Heart disease Mother          s/p pci   Ulcerative colitis Father    Hypertension Father    Heart attack Father    Diabetes Sister    Cancer Sister        uterine   Pulmonary fibrosis Sister    Ulcerative colitis Daughter    Cancer Maternal Uncle        LUNG   Irritable bowel syndrome Other        grandchildren   Colon cancer Neg Hx    Esophageal cancer Neg Hx    Stomach cancer Neg Hx     Rectal cancer Neg Hx     Social History   Socioeconomic History   Marital status: Divorced    Spouse name: Not on file   Number of children: Not on file   Years of education: Not on file   Highest education level: Not on file  Occupational History   Not on file  Tobacco Use   Smoking status: Former    Packs/day: 1.00    Years: 4.00    Total pack years: 4.00    Types: Cigarettes    Quit date: 05/21/1979    Years since quitting: 43.1   Smokeless tobacco: Never  Vaping Use   Vaping Use: Never used  Substance and Sexual Activity   Alcohol use: No    Alcohol/week: 0.0 standard drinks of alcohol   Drug use: No   Sexual activity: Yes    Birth control/protection: Post-menopausal    Comment: 1st intercourse 66 yo-Fewer than 5 partners  Other Topics Concern   Not on file  Social History Narrative   Not on file   Social Determinants of Health   Financial Resource Strain: Low Risk  (05/26/2022)   Overall Financial Resource Strain (CARDIA)    Difficulty of Paying Living Expenses: Not hard at all  Food Insecurity: No Food Insecurity (07/16/2022)   Hunger Vital Sign    Worried About Running Out of Food in the Last Year: Never true    Ran Out of Food in the Last Year: Never true  Transportation Needs: No Transportation Needs (07/16/2022)   PRAPARE - Transportation    Lack of Transportation (Medical): No    Lack of Transportation (Non-Medical): No  Physical Activity: Inactive (05/26/2022)   Exercise Vital Sign    Days of Exercise per Week: 0 days    Minutes of Exercise per Session: 0 min  Stress: No Stress Concern Present (05/26/2022)   [employer] - Occupational Stress Questionnaire    Feeling of Stress : Not at all  Social Connections: Moderately Isolated (05/26/2022)   Social Connection and Isolation Panel [NHANES]    Frequency of Communication with Friends and Family: More than three times a week    Frequency of Social Gatherings with Friends and  Family: More than three times a week    Attends Religious Services: Never    Active Member of Clubs or Organizations: No    Attends Club or Organization Meetings: Never    Marital Status: Married  Intimate Partner Violence: Not At Risk (07/16/2022)   Humiliation, Afraid, Rape, and Kick questionnaire    Fear of Current or Ex-Partner: No    Emotionally Abused: No    Physically Abused: No    Sexually Abused: No    Review of Systems: Pertinent positive and negative review of systems were noted in the above HPI section.  All other review of systems was otherwise negative.   Physical Exam: Vital signs in last 24   hours: Temp:  [97.6 F (36.4 C)-98.4 F (36.9 C)] 98.4 F (36.9 C) (11/13 1327) Pulse Rate:  [96-108] 106 (11/13 1327) Resp:  [12-18] 17 (11/13 1327) BP: (125-153)/(82-100) 147/91 (11/13 1327) SpO2:  [97 %-99 %] 99 % (11/13 1327) Last BM Date : 07/18/22 General:   Alert,  Well-developed, well-nourished,older WF  pleasant and cooperative in NAD Head:  Normocephalic and atraumatic. Eyes:  Sclera clear, no icterus.   Conjunctiva pink. Ears:  Normal auditory acuity. Nose:  No deformity, discharge,  or lesions. Mouth:  No deformity or lesions.   Neck:  Supple; no masses or thyromegaly. Lungs:  Clear throughout to auscultation.   No wheezes, crackles, or rhonchi.  Heart:  Regular rate and rhythm; no murmurs, clicks, rubs,  or gallops. Abdomen:  Soft,nontender,, nondistended, multiple incisional scars, midline incisional scar, ileostomy right lower quadrant, bowel sounds are active Rectal: not done Msk:  Symmetrical without gross deformities. . Pulses:  Normal pulses noted. Extremities:  Without clubbing or edema. Neurologic:  Alert and  oriented x4;  grossly normal neurologically. Skin:  Intact without significant lesions or rashes.. Psych:  Alert and cooperative. Normal mood and affect.  Intake/Output from previous day: 11/12 0701 - 11/13 0700 In: 2321.5 [P.O.:960;  I.V.:1361.5] Out: -  Intake/Output this shift: Total I/O In: 120 [P.O.:120] Out: 800 [Urine:350; Stool:450]  Lab Results: Recent Labs    07/17/22 0446 07/19/22 0437  WBC 19.4* 10.9*  HGB 10.3* 9.5*  HCT 31.6* 29.5*  PLT 302 317   BMET Recent Labs    07/17/22 0446 07/19/22 0437  NA 135 140  K 3.6 3.5  CL 103 102  CO2 21* 26  GLUCOSE 101* 100*  BUN 47* 30*  CREATININE 4.40* 2.92*  CALCIUM 8.1* 8.4*   LFT Recent Labs    07/17/22 0446  PROT 7.8  ALBUMIN 3.6  AST 27  ALT 36  ALKPHOS 79  BILITOT 1.0   PT/INR No results for input(s): "LABPROT", "INR" in the last 72 hours. Hepatitis Panel No results for input(s): "HEPBSAG", "HCVAB", "HEPAIGM", "HEPBIGM" in the last 72 hours.   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   She was never able to achieve remission with TNF therapy, then eventually Entyvio and 6-MP. She ultimately underwent a diverting ileostomy in August 2022, then had J-pouch excision and creation of end ileostomy January 2023 Dr. Ashburn.  Had been doing well over the past for 5 months until she developed episodes of recurrent nausea and vomiting followed by hours of high ileostomy output but no associated abdominal distention or pain Is episodes have been associated with weakness syncope or presyncope and acute kidney injury. Is her third admission with similar situation over the past couple of months.  CT without IV contrast October 2023 did not show any evidence of IBD or partial small bowel obstruction or dilated loops of bowel.  Etiology of these episodes is not clear, rule out recurrent partial obstruction secondary to adhesions, versus active IBD though feel less likely  #2 chronic kidney disease stage IV-recurrent episodes of AKI with these  presentations of nausea vomiting and high ostomy output  She does not have chronically high ileostomy output by history  #3  hypertension #4  Recurrent iron deficiency anemia-felt secondary to IBD though has not had any evidence of active IBD over this past year.  She receives periodic   iron infusions through hematology   PLAN: Advance to soft diet NPO. after midnight Fecal calprotectin Sed rate, CRP Patient has been scheduled for ileoscopy with Dr. Alvin Rubano tomorrow at 1 PM.  Procedure was discussed in detail with the patient including indications risk and benefits and she is agreeable to proceed If ileoscopy is unrevealing we will plan to proceed with CT enterography with oral but no IV contrast  I will also discuss with Dr. Armbruster getting her set up with outpatient IV fluids 3 times weekly through [facility] infusion center/[address] given she has very little reserve and quickly dips into acute kidney injury  Amy Esterwood PA-C 07/19/2022, 2:20 PM    Attending Physician Note   I have taken a history, reviewed the chart and examined the patient. I performed a substantive portion of this encounter, including complete performance of at least one of the key components, in conjunction with the APP. I agree with the APP's note, impression and recommendations with my edits. My additional impressions and recommendations are as follows.   Recurrent episodes of nausea, vomiting followed by several hours of high ostomy output followed by AKI on CKD. R/O intermittent obstruction from adhesion, stricture, less likely active Crohn's. Further evaluation with CRP, ESR, fecal calprotectin, ileoscopy and CT enterography.   [place], MD FACG See [facility], [facility], for our on call provider      

## 2022-07-19 NOTE — Consult Note (Addendum)
Consultation  Referring Provider: [facility]/ Dwyane Dee Primary Care Physician:  Carollee Herter, Alferd Apa, DO Primary Gastroenterologist:  Dr. Havery Moros  Reason for Consultation: Recurrent nausea and vomiting with episodes of high ileostomy output  HPI: [name] is a 66 y.o. 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  She has been through Remicade, 6-MP, Humira which did not get her into remission and then developed high antibody titers, and most recently had been on Entyvio and low-dose 6-MP prior to undergoing surgery last year.  She has not been on any therapy since surgery.  Patient underwent a diverting ileostomy in August 2022 per Dr. Drue Flirt for severe pouchitis, then had her J-pouch excised with placement of end ileostomy in January 2023 per Dr. Drue Flirt. She was last seen in our office in May 2023, doing well at that time and with no evidence of high ileostomy output.  It was discussed with her that some sort of surveillance for IBD was indicated but she did not want to go through with ileoscopy at that point.  She did have a fecal calprotectin done which was normal. Patient did require IV fluids 3 times weekly for several months after her surgery and that was eventually discontinued. She has history of stage IV chronic kidney disease with baseline creatinine between 2 and 3, hypertension, chronic GERD, and has had several surgeries for IBD in addition to those outlined above. She is now on her third admission over the past couple of months with acute kidney injury.  She describes all 3 of these episodes as having onset of nausea and vomiting at home followed by a period of several hours of very high ileostomy output, followed by weakness etc.  She was admitted 06/29/2022 through 07/01/2022 with an episode and actually had syncope with that episode. That time had CT of the abdomen and  pelvis without contrast that showed no abnormal bowel dilation or inflammation She met SIRS criteria at that point but no infection was found. She was readmitted 10/31 to 07/12/2022 given with generalized weakness and a syncopal/presyncopal episode and creatinine up to 6.4.  Given patient says that with this episode she also had the nausea vomiting and several hours of high ileostomy output which then resolves. She does not get any abdominal pain or cramping with these episodes and once the episode resolves she goes back to her usual ileostomy output which she says will be about 150 cc 3-4 times daily. Patient was readmitted on 07/16/2022 with yet another episode. BUN 39/creatinine 4.27 BBC 12.2/hemoglobin 10.7/hematocrit 32.8/MCV 103 Iron studies were normal. No repeat CT but plain films showed no evidence of distended or obstructed loops  She has been able to advance her diet here without difficulty is currently on full liquids, is back to having her usual ileostomy output which is liquid and generally 150 to 200 cc 3-4 times daily which she empties.  She is not having any nausea or vomiting or abdominal pain  She is obviously afraid that she will go home and have recurrence of symptoms and is concerned about having a partial obstruction due to all of her prior surgeries.  She feels that her recurrent symptoms are consistent with partial obstruction which relieves itself.  Now amenable to ileoscopy if felt indicated . She also relates that her nephrologist/Dr. Marval Regal has advised her that she should have IV fluids twice weekly as  an outpatient and was hoping to get that done through the infusion center/Dr. Havery Moros.  At some point she had been started on Questran within the past month or so but that has not really changed these episodes or her output.    Past Medical History:  Diagnosis Date   Abdominal pain    Hx   Allergy    Anal stenosis    Anemia    Anxiety    Arthritis    Asthma     patient does not have inhaler   Blood in stool    Hx   Blood in urine    Hx   Blood transfusion without reported diagnosis    Cataract    CKD (chronic kidney disease) stage 3, GFR 30-59 ml/min (HCC)    Crohn's colitis, other complication (HCC)    De Quervain's tenosynovitis    Depression    Difficulty urinating    Hx   Easy bruising    Esophagitis    Fainting    History - resolved - due to dehydration   Fatigue    Hx   Fibroid    Hx   Gastric polyp    GERD (gastroesophageal reflux disease)    Hearing loss    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   Hemorrhoids, external    Hemorrhoids, internal    Herpes, genital    vaginal treated 07/05/12 and pt states is resolved   History of cervical dysplasia    History of small bowel obstruction    Hyperlipidemia    currently no meds   Hyperparathyroidism    Hypertension    Hypokalemia    Hx   IBD (inflammatory bowel disease)    initially colectomy for suspected UC, 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   Obesity    Ovarian cyst    Poor dental hygiene    Pulmonary nodule, right    right upper lobe   Rectal bleeding    Hx   Rectal pain    Hx   Renal insufficiency    CKD - stage 3   RLS (restless legs syndrome)    no meds   Tooth infection 11/2016   right low   Ulcerative colitis    Visual disturbance    wears glasses   Weakness generalized    Hx - patient denies generalized weakness   Wears dentures    upper only    Past Surgical History:  Procedure Laterality Date   ANAL DILATION     CERVICAL BIOPSY  W/ LOOP ELECTRODE EXCISION     CHOLECYSTECTOMY     COLONOSCOPY     [name]   fatty tumor removed from back     X 2   HEMORRHOID SURGERY     ILEOSTOMY CLOSURE     RESTORATIVE PROCTOCOLECTOMY     with insertion of ileoanal J Pouch with loop ileostomy   SHOULDER ARTHROSCOPY WITH CAPSULORRHAPHY Left 06/14/2019   Procedure: LEFT SHOULDER ARTHRSCOPIC REPAIR OF BONY BANKART FRACTURE;  Surgeon:  Tania Ade, MD;  Location: [facility];  Service: Orthopedics;  Laterality: Left;  REQUEST 65 MINUTE   SIGMOIDOSCOPY     TOTAL ABDOMINAL HYSTERECTOMY  1998   TAH/LSO   TUBAL LIGATION     UPPER GASTROINTESTINAL ENDOSCOPY     [name]    Prior to Admission medications   Medication Sig Start Date End Date Taking? Authorizing Provider  acetaminophen (TYLENOL) 500 MG tablet Take 1,000 mg by  mouth at bedtime as needed (for pain/sleep).   Yes [provider]  allopurinol (ZYLOPRIM) 300 MG tablet TAKE ONE TABLET BY MOUTH DAILY AT 9AM Patient taking differently: Take 150 mg by mouth at bedtime. 07/05/22  Yes Roma Schanz R, DO  amLODipine (NORVASC) 5 MG tablet Take 5 mg by mouth daily. 05/20/22  Yes [provider]  Calcium Carbonate Antacid (TUMS EXTRA STRENGTH 750 PO) Take 2 tablets by mouth daily as needed (CHEW for heartburn).   Yes [provider]  diphenhydrAMINE (BENADRYL ALLERGY) 25 MG tablet Take 25 mg by mouth at bedtime.   Yes [provider]  DULoxetine (CYMBALTA) 60 MG capsule TAKE TWO CAPSULES (120MG) BY MOUTH DAILY AT 9AM Patient taking differently: Take 120 mg by mouth daily. 07/05/22  Yes Ann Held, DO  folic acid (FOLVITE) 1 MG tablet Take 2 tablets (2 mg total) by mouth daily. Patient taking differently: Take 1 mg by mouth daily. 05/26/17  Yes Ennever, Rudell Cobb, MD  GAS-X EXTRA STRENGTH 125 MG CAPS Take 125 mg by mouth at bedtime.   Yes [provider]  loperamide (IMODIUM A-D) 2 MG tablet Take 4 mg by mouth at bedtime.   Yes [provider]  metoprolol succinate (TOPROL-XL) 100 MG 24 hr tablet Take 50 mg by mouth See admin instructions. Take 50 mg by mouth in the morning and afternoon   Yes [provider]  Vitamin D, Ergocalciferol, (DRISDOL) 1.25 MG (50000 UNIT) CAPS capsule Take 50,000 Units by mouth every 30 (thirty) days.   Yes [provider]  OVER THE COUNTER MEDICATION Take 1 tablet by  mouth See admin instructions. Nervive Nerve Health tablet- Take 1 tablet by mouth at bedtime Patient not taking: Reported on 07/16/2022    [provider]    Current Facility-Administered Medications  Medication Dose Route Frequency Provider Last Rate Last Admin   amLODipine (NORVASC) tablet 5 mg  5 mg Oral Daily Shawna Clamp, MD   5 mg at 07/19/22 1308   cholestyramine light (PREVALITE) packet 4 g  4 g Oral TID PC Kyle, Tyrone A, DO   4 g at 07/19/22 1235   DULoxetine (CYMBALTA) DR capsule 120 mg  120 mg Oral Daily Kyle, Tyrone A, DO   120 mg at 38/88/28 0034   folic acid (FOLVITE) tablet 1 mg  1 mg Oral Daily Kyle, Tyrone A, DO   1 mg at 07/19/22 9179   lactated ringers infusion   Intravenous Continuous Kyle, Tyrone A, DO 125 mL/hr at 07/19/22 0357 New Bag at 07/19/22 0357   loperamide (IMODIUM) capsule 4 mg  4 mg Oral QHS Kyle, Tyrone A, DO   4 mg at 07/18/22 2141   metoprolol succinate (TOPROL-XL) 24 hr tablet 50 mg  50 mg Oral BID Shawna Clamp, MD       prochlorperazine (COMPAZINE) injection 10 mg  10 mg Intravenous Q6H PRN Marylyn Ishihara, Tyrone A, DO       sodium bicarbonate tablet 650 mg  650 mg Oral TID Marylyn Ishihara, Tyrone A, DO   650 mg at 07/19/22 0810    Allergies as of 07/16/2022 - Review Complete 07/16/2022  Allergen Reaction Noted   Sulfa antibiotics Hives, Itching, Rash, and Swelling 05/25/2007   Morphine Other (See Comments) 05/25/2007   Sulfonamide derivatives Hives, Itching, and Swelling 05/25/2007    Family History  Problem Relation Age of Onset   Hypertension Mother    Heart disease Mother  s/p pci   Ulcerative colitis Father    Hypertension Father    Heart attack Father    Diabetes Sister    Cancer Sister        uterine   Pulmonary fibrosis Sister    Ulcerative colitis Daughter    Cancer Maternal Uncle        LUNG   Irritable bowel syndrome Other        grandchildren   Colon cancer Neg Hx    Esophageal cancer Neg Hx    Stomach cancer Neg Hx     Rectal cancer Neg Hx     Social History   Socioeconomic History   Marital status: Divorced    Spouse name: Not on file   Number of children: Not on file   Years of education: Not on file   Highest education level: Not on file  Occupational History   Not on file  Tobacco Use   Smoking status: Former    Packs/day: 1.00    Years: 4.00    Total pack years: 4.00    Types: Cigarettes    Quit date: 05/21/1979    Years since quitting: 43.1   Smokeless tobacco: Never  Vaping Use   Vaping Use: Never used  Substance and Sexual Activity   Alcohol use: No    Alcohol/week: 0.0 standard drinks of alcohol   Drug use: No   Sexual activity: Yes    Birth control/protection: Post-menopausal    Comment: 1st intercourse 66 yo-Fewer than 5 partners  Other Topics Concern   Not on file  Social History Narrative   Not on file   Social Determinants of Health   Financial Resource Strain: Low Risk  (05/26/2022)   Overall Financial Resource Strain (CARDIA)    Difficulty of Paying Living Expenses: Not hard at all  Food Insecurity: No Food Insecurity (07/16/2022)   Hunger Vital Sign    Worried About Running Out of Food in the Last Year: Never true    Ran Out of Food in the Last Year: Never true  Transportation Needs: No Transportation Needs (07/16/2022)   PRAPARE - Hydrologist (Medical): No    Lack of Transportation (Non-Medical): No  Physical Activity: Inactive (05/26/2022)   Exercise Vital Sign    Days of Exercise per Week: 0 days    Minutes of Exercise per Session: 0 min  Stress: No Stress Concern Present (05/26/2022)   [place]    Feeling of Stress : Not at all  Social Connections: Moderately Isolated (05/26/2022)   Social Connection and Isolation Panel [NHANES]    Frequency of Communication with Friends and Family: More than three times a week    Frequency of Social Gatherings with Friends and  Family: More than three times a week    Attends Religious Services: Never    Marine scientist or Organizations: No    Attends Archivist Meetings: Never    Marital Status: Married  Human resources officer Violence: Not At Risk (07/16/2022)   Humiliation, Afraid, Rape, and Kick questionnaire    Fear of Current or Ex-Partner: No    Emotionally Abused: No    Physically Abused: No    Sexually Abused: No    Review of Systems: Pertinent positive and negative review of systems were noted in the above HPI section.  All other review of systems was otherwise negative.   Physical Exam: Vital signs in last 24  hours: Temp:  [97.6 F (36.4 C)-98.4 F (36.9 C)] 98.4 F (36.9 C) (11/13 1327) Pulse Rate:  [96-108] 106 (11/13 1327) Resp:  [12-18] 17 (11/13 1327) BP: (125-153)/(82-100) 147/91 (11/13 1327) SpO2:  [97 %-99 %] 99 % (11/13 1327) Last BM Date : 07/18/22 General:   Alert,  Well-developed, well-nourished,older WF  pleasant and cooperative in NAD Head:  Normocephalic and atraumatic. Eyes:  Sclera clear, no icterus.   Conjunctiva pink. Ears:  Normal auditory acuity. Nose:  No deformity, discharge,  or lesions. Mouth:  No deformity or lesions.   Neck:  Supple; no masses or thyromegaly. Lungs:  Clear throughout to auscultation.   No wheezes, crackles, or rhonchi.  Heart:  Regular rate and rhythm; no murmurs, clicks, rubs,  or gallops. Abdomen:  Soft,nontender,, nondistended, multiple incisional scars, midline incisional scar, ileostomy right lower quadrant, bowel sounds are active Rectal: not done Msk:  Symmetrical without gross deformities. . Pulses:  Normal pulses noted. Extremities:  Without clubbing or edema. Neurologic:  Alert and  oriented x4;  grossly normal neurologically. Skin:  Intact without significant lesions or rashes.. Psych:  Alert and cooperative. Normal mood and affect.  Intake/Output from previous day: 11/12 0701 - 11/13 0700 In: 2321.5 [P.O.:960;  I.V.:1361.5] Out: -  Intake/Output this shift: Total I/O In: 120 [P.O.:120] Out: 800 [Urine:350; Stool:450]  Lab Results: Recent Labs    07/17/22 0446 07/19/22 0437  WBC 19.4* 10.9*  HGB 10.3* 9.5*  HCT 31.6* 29.5*  PLT 302 317   BMET Recent Labs    07/17/22 0446 07/19/22 0437  NA 135 140  K 3.6 3.5  CL 103 102  CO2 21* 26  GLUCOSE 101* 100*  BUN 47* 30*  CREATININE 4.40* 2.92*  CALCIUM 8.1* 8.4*   LFT Recent Labs    07/17/22 0446  PROT 7.8  ALBUMIN 3.6  AST 27  ALT 36  ALKPHOS 79  BILITOT 1.0   PT/INR No results for input(s): "LABPROT", "INR" in the last 72 hours. Hepatitis Panel No results for input(s): "HEPBSAG", "HCVAB", "HEPAIGM", "HEPBIGM" in the last 72 hours.   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   She was never able to achieve remission with TNF therapy, then eventually Entyvio and 6-MP. She ultimately underwent a diverting ileostomy in August 2022, then had J-pouch excision and creation of end ileostomy January 2023 Dr. Drue Flirt.  Had been doing well over the past for 5 months until she developed episodes of recurrent nausea and vomiting followed by hours of high ileostomy output but no associated abdominal distention or pain Is episodes have been associated with weakness syncope or presyncope and acute kidney injury. Is her third admission with similar situation over the past couple of months.  CT without IV contrast October 2023 did not show any evidence of IBD or partial small bowel obstruction or dilated loops of bowel.  Etiology of these episodes is not clear, rule out recurrent partial obstruction secondary to adhesions, versus active IBD though feel less likely  #2 chronic kidney disease stage IV-recurrent episodes of AKI with these  presentations of nausea vomiting and high ostomy output  She does not have chronically high ileostomy output by history  #3  hypertension #4  Recurrent iron deficiency anemia-felt secondary to IBD though has not had any evidence of active IBD over this past year.  She receives periodic  iron infusions through hematology   PLAN: Advance to soft diet NPO. after midnight Fecal calprotectin Sed rate, CRP Patient has been scheduled for ileoscopy with Dr. Fuller Plan tomorrow at 1 PM.  Procedure was discussed in detail with the patient including indications risk and benefits and she is agreeable to proceed If ileoscopy is unrevealing we will plan to proceed with CT enterography with oral but no IV contrast  I will also discuss with Dr. Havery Moros getting her set up with outpatient IV fluids 3 times weekly through [facility] infusion center/[address] given she has very little reserve and quickly dips into acute kidney injury  Amy Esterwood PA-C 07/19/2022, 2:20 PM    Attending Physician Note   I have taken a history, reviewed the chart and examined the patient. I performed a substantive portion of this encounter, including complete performance of at least one of the key components, in conjunction with the APP. I agree with the APP's note, impression and recommendations with my edits. My additional impressions and recommendations are as follows.   Recurrent episodes of nausea, vomiting followed by several hours of high ostomy output followed by AKI on CKD. R/O intermittent obstruction from adhesion, stricture, less likely active Crohn's. Further evaluation with CRP, ESR, fecal calprotectin, ileoscopy and CT enterography.   Lucio Edward, MD [facility] See AMION, [facility], for our on call provider

## 2022-07-19 NOTE — Progress Notes (Signed)
PROGRESS NOTE    [name]  HQI:[id number] DOB: 06-Mar-1956 DOA: 07/16/2022  PCP: Ann Held, DO    Brief Narrative:  This 66 years old female with PMH significant for CKD stage IV, Crohn's disease with ileostomy, short gut syndrome, GERD, hypertension presented in the ED with complaints of nausea, vomiting and hypotension.  She was recently discharged from hospital on 07/12/2022 after hospitalization for syncope. She was found to be orthostatic and with AKI on CKD 4 likely from high output from her ileostomy.  She recovered with fluid resuscitation and then she was discharged.  Patient reports she was doing better for few days and then she started having nausea and vomiting,  increased output from her ileostomy.  She denies any fever,  abdominal pain or sick contacts. Patient is admitted for AKI on CKD stage IV secondary to nausea,  vomiting and high output from ileostomy. Nephrology is consulted. AKI improved with IV hydration.  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 Patient presented with persistent nausea, vomiting, high output from her ileostomy. Likely from high output ileostomy and nausea and vomiting. She does have a history of short gut syndrome. X-ray shows paucity of small bowel gas without evidence of obstructed loops.,  No free air,  She is started on aggressive IV fluid resuscitation.  Continued on sodium bicarbonate. Avoid nephrotoxic medication, creatinine is improving. 5.06>4.40>2.92 AKI resolved.  Metabolic acidosis resolved.  Serum creatinine at baseline.  Nausea and vomiting: >  Improving Continue IV fluid resuscitation, Continue IV compazine Started on clear liquid diet, advanced to full liquid.  High output ileostomy/short gut syndrome: Continue Imodium. Continue Questran She continues to have increased output from ileostomy,  GI is consulted for further recommendation.  Chronic leucocytosis: > Improving Denies any fever, no signs of any infection. Hold on antibiotics at this time.  GERD: Continue pantoprazole 40 mg daily  Essential hypertension: Hold blood pressure medications and blood pressure is on soft side.  DVT prophylaxis: SCDs Code Status: Full code Family Communication: No family at bed side Disposition Plan:   Status is: Inpatient Remains inpatient appropriate because: Admitted for nausea, vomiting and high output from ileostomy leading to AKI on CKD stage IV.  Patient needs aggressive IV fluid resuscitation.  Patient still has high output ileostomy.  GI is consulted for further recommendation.    Consultants:  Nephrology  Procedures: None Antimicrobials: None  Subjective: Patient was seen and examined at bedside.Overnight events noted. Patient still reports high output from ileostomy. Otherwise denies any nausea and vomiting. Renal functions has improved.  She tolerated full liquid diet.  Advanced to soft diet  Objective: Vitals:   07/18/22 0550 07/18/22 1431 07/18/22 2043 07/19/22 0601  BP: (!) 145/90 (!) 125/100 (!) 140/82 (!) 153/88  Pulse: (!) 107 (!) 108 96 97  Resp:  18 12 16   Temp: 97.6 F (36.4 C) 98.1 F (36.7 C) 98.3 F (36.8 C) 97.6 F (36.4 C)  TempSrc: Oral Oral Oral Oral  SpO2: 98% 97% 99% 98%  Weight:      Height:  Intake/Output Summary (Last 24 hours) at 07/19/2022 1136 Last data filed at 07/19/2022 0926 Gross per 24 hour  Intake 1335.45 ml  Output --  Net 1335.45 ml   Filed Weights   07/16/22 1125  Weight: 81.4 kg    Examination:  General exam: Appears comfortable, not in any acute  distress, deconditioned. Respiratory system: CTA bilaterally, respiratory effort normal, RR 16. Cardiovascular system: S1 & S2 heard, regular rate and rhythm, no murmur. Gastrointestinal system: Abdomen is soft, non tender, non distended, BS+,  ileostomy+ Central nervous system: Alert and oriented x 3, no focal neurological deficits. Extremities: No edema, no cyanosis, no clubbing. Skin: No rashes, lesions or ulcers Psychiatry: Judgement and insight appear normal. Mood & affect appropriate.     Data Reviewed: I have personally reviewed following labs and imaging studies  CBC: Recent Labs  Lab 07/15/22 1235 07/16/22 1158 07/17/22 0446 07/19/22 0437  WBC 12.2* 16.8* 19.4* 10.9*  NEUTROABS 6.9 12.5*  --   --   HGB 10.7* 12.3 10.3* 9.5*  HCT 32.8* 38.7 31.6* 29.5*  MCV 103.7* 106.3* 104.3* 105.4*  PLT 348.0 362 302 158   Basic Metabolic Panel: Recent Labs  Lab 07/15/22 1235 07/16/22 1158 07/17/22 0446 07/19/22 0437  NA 136 136 135 140  K 3.9 4.7 3.6 3.5  CL 97 102 103 102  CO2 25 18* 21* 26  GLUCOSE 133* 122* 101* 100*  BUN 39* 56* 47* 30*  CREATININE 4.27* 5.06* 4.40* 2.92*  CALCIUM 7.7* 8.6* 8.1* 8.4*   GFR: Estimated Creatinine Clearance: 18.6 mL/min (A) (by C-G formula based on SCr of 2.92 mg/dL (H)). Liver Function Tests: Recent Labs  Lab 07/15/22 1235 07/16/22 1158 07/17/22 0446  AST 37 37 27  ALT 40* 46* 36  ALKPHOS 91 90 79  BILITOT 0.4 0.7 1.0  PROT 7.4 9.2* 7.8  ALBUMIN 4.1 4.2 3.6   No results for input(s): "LIPASE", "AMYLASE" in the last 168 hours. No results for input(s): "AMMONIA" in the last 168 hours. Coagulation Profile: No results for input(s): "INR", "PROTIME" in the last 168 hours. Cardiac Enzymes: No results for input(s): "CKTOTAL", "CKMB", "CKMBINDEX", "TROPONINI" in the last 168 hours. BNP (last 3 results) No results for input(s): "PROBNP" in the last 8760 hours. HbA1C: No results for input(s): "HGBA1C" in the last 72  hours. CBG: No results for input(s): "GLUCAP" in the last 168 hours. Lipid Profile: No results for input(s): "CHOL", "HDL", "LDLCALC", "TRIG", "CHOLHDL", "LDLDIRECT" in the last 72 hours. Thyroid Function Tests: No results for input(s): "TSH", "T4TOTAL", "FREET4", "T3FREE", "THYROIDAB" in the last 72 hours. Anemia Panel: No results for input(s): "VITAMINB12", "FOLATE", "FERRITIN", "TIBC", "IRON", "RETICCTPCT" in the last 72 hours.  Sepsis Labs: No results for input(s): "PROCALCITON", "LATICACIDVEN" in the last 168 hours.  No results found for this or any previous visit (from the past 240 hour(s)).   Radiology Studies: No results found.  Scheduled Meds:  cholestyramine light  4 g Oral TID PC   DULoxetine  120 mg Oral Daily   folic acid  1 mg Oral Daily   loperamide  4 mg Oral QHS   sodium bicarbonate  650 mg Oral TID   Continuous Infusions:  lactated ringers 125 mL/hr at 07/19/22 0357     LOS: 3 days    Time spent: 35 mins    Mavrik Bynum, MD [facility]   If 7PM-7AM, please contact night-coverage

## 2022-07-19 NOTE — Assessment & Plan Note (Signed)
Recheck labs  Pt still feels week

## 2022-07-19 NOTE — Progress Notes (Signed)
Established Patient Office Visit  Subjective   Patient ID: Kilyn Maragh, female    DOB: [birth year]  Age: 66 y.o. 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 07/08/2022   High anion gap metabolic acidosis 94/50/3888   GERD without esophagitis 07/06/2022   Orthostatic hypotension 07/06/2022   Ileostomy in place [facility]) 06/30/2022   Acute renal failure superimposed on stage 4 chronic kidney disease, unspecified acute renal failure type ([place]) 06/29/2022   Hypotension 06/29/2022   Sepsis secondary to UTI ([place]) 06/29/2022   Urinary frequency 03/05/2022   Neck pain 12/08/2021   Facial numbness 12/08/2021   Tooth abscess 11/10/2021   Surgical wound breakdown, initial encounter 05/27/2021   Crohn's disease of both small and large intestine ([place]) 05/20/2021   Acute kidney injury superimposed on chronic kidney disease ([place]) 05/14/2021   Thrombocytosis 05/14/2021   SIRS (systemic inflammatory response syndrome) ([place]) 05/14/2021   Hypochloremia 05/14/2021   Chronic pain of left knee 03/10/2021   Depression, major, single episode, severe ([place]) 12/18/2020   Hyperlipidemia associated with type 2 diabetes mellitus ([place]) 12/18/2020   Severe sepsis ([place]) 12/07/2020   Acute pyelonephritis 12/07/2020   Closed fracture of left tibial plateau 10/30/2020   Screening for osteoporosis 10/30/2020   Diarrhea with dehydration 09/21/2020   Crohn's disease of jejunum ([place]) 08/23/2020   Chronic diarrhea 28/00/3491   Metabolic acidosis 79/15/0569   ARF (acute renal failure) ([place]) 08/23/2020   Acute renal failure superimposed on stage 4 chronic kidney disease ([place]) 08/22/2020   Hyperglycemia 02/26/2020   Immunologic deficiency syndrome  ([place]) 02/26/2020   Prothrombin gene mutation ([place]) 02/26/2020   Acute pain of left shoulder 06/04/2019   Pes anserine bursitis 04/12/2018   Pain of right thumb 04/12/2018   Low back pain 06/21/2017   Acute right-sided low back pain with right-sided sciatica 06/21/2017   Gout 06/21/2017   Superficial thrombophlebitis of right upper extremity 79/48/0165   Acute basilic vein thrombosis, right 02/16/2017   Symptomatic anemia 01/15/2017   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[place]) 12/01/2015   Rectal pain 12/01/2015   IBD (inflammatory bowel disease) 10/24/2015   Anal stricture 10/24/2015   Gout of foot 10/29/2014   Ovarian cyst, right 06/12/2014   Acute on chronic renal failure ([place]) 04/02/2013   Hydrosalpinx 02/14/2013   Hyperparathyroidism ([place]) 09/18/2012   HSV (herpes simplex virus) anogenital infection 06/28/2012   CKD (chronic kidney disease) stage 4, GFR 15-29 ml/min ([place]) 06/17/2012   LLQ abdominal pain 06/22/2011   RESTLESS LEGS SYNDROME 08/05/2009   PULMONARY NODULE, RIGHT UPPER LOBE 02/28/2009   TOBACCO ABUSE, HX OF 02/11/2009   PAIN IN JOINT, MULTIPLE SITES 02/04/2009   Nonspecific (abnormal) findings on radiological and other examination of body structure 12/23/2008   CT, CHEST, ABNORMAL 12/23/2008   HEMANGIOMA SIMPLEX 12/16/2008   Pain in joint, pelvic region and thigh 12/16/2008   SKIN RASH 12/16/2008   TINEA CORPORIS 04/18/2008   LATERAL EPICONDYLITIS, RIGHT 04/18/2008   Morbid obesity ([place]) 02/05/2008   ANEMIA 02/05/2008   ANAL STENOSIS 02/05/2008   Stool culture positive for Clostridioides difficile 02/05/2008   Personal history of other endocrine, metabolic, and immunity disorders 02/05/2008   History of Crohn's disease 02/05/2008   Syncope and  collapse 02/02/2008   Hyponatremia 12/15/2007   Dehydration 12/15/2007   HYPOKALEMIA 12/15/2007   ANEMIA-IRON DEFICIENCY 12/15/2007   LEUKOCYTOSIS 12/15/2007   Anxiety state 12/15/2007   Disorder resulting from impaired renal function  11/16/2007   TINNITUS NOS 05/25/2007   DE QUERVAIN'S TENOSYNOVITIS 05/25/2007   Hyperlipidemia LDL goal <100 10/23/2006   Depression 10/23/2006   Essential hypertension 10/23/2006   GERD 10/23/2006   Gastro-esophageal reflux disease without esophagitis 10/23/2006   Ulcerative colitis without complications ([place]) 52/84/1324   GASTRIC POLYP 11/16/2001   GERD with esophagitis 11/16/2001   Past Medical History:  Diagnosis Date   Abdominal pain    Hx   Allergy    Anal stenosis    Anemia    Anxiety    Arthritis    Asthma    patient does not have inhaler   Blood in stool    Hx   Blood in urine    Hx   Blood transfusion without reported diagnosis    Cataract    CKD (chronic kidney disease) stage 3, GFR 30-59 ml/min (HCC)    Crohn's colitis, other complication (HCC)    De Quervain's tenosynovitis    Depression    Difficulty urinating    Hx   Easy bruising    Esophagitis    Fainting    History - resolved - due to dehydration   Fatigue    Hx   Fibroid    Hx   Gastric polyp    GERD (gastroesophageal reflux disease)    Hearing loss    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   Hemorrhoids, external    Hemorrhoids, internal    Herpes, genital    vaginal treated 07/05/12 and pt states is resolved   History of cervical dysplasia    History of small bowel obstruction    Hyperlipidemia    currently no meds   Hyperparathyroidism    Hypertension    Hypokalemia    Hx   IBD (inflammatory bowel disease)    initially colectomy for suspected UC, 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   Obesity    Ovarian cyst    Poor dental hygiene    Pulmonary nodule, right    right upper lobe   Rectal bleeding    Hx   Rectal pain    Hx   Renal insufficiency    CKD - stage 3   RLS (restless legs syndrome)    no meds   Tooth infection 11/2016   right low   Ulcerative colitis    Visual disturbance    wears glasses   Weakness generalized    Hx - patient denies  generalized weakness   Wears dentures    upper only   Past Surgical History:  Procedure Laterality Date   ANAL DILATION     CERVICAL BIOPSY  W/ LOOP ELECTRODE EXCISION     CHOLECYSTECTOMY     COLONOSCOPY     [name]   fatty tumor removed from back     X 2   HEMORRHOID SURGERY     ILEOSTOMY CLOSURE     RESTORATIVE PROCTOCOLECTOMY     with insertion of ileoanal J Pouch with loop ileostomy   SHOULDER ARTHROSCOPY WITH CAPSULORRHAPHY Left 06/14/2019   Procedure: LEFT SHOULDER ARTHRSCOPIC REPAIR OF BONY BANKART FRACTURE;  Surgeon: Tania Ade, MD;  Location: [facility];  Service: Orthopedics;  Laterality: Left;  REQUEST 90 MINUTE   SIGMOIDOSCOPY  TOTAL ABDOMINAL HYSTERECTOMY  1998   TAH/LSO   TUBAL LIGATION     UPPER GASTROINTESTINAL ENDOSCOPY     [name]   Social History   Tobacco Use   Smoking status: Former    Packs/day: 1.00    Years: 4.00    Total pack years: 4.00    Types: Cigarettes    Quit date: 05/21/1979    Years since quitting: 43.1   Smokeless tobacco: Never  Vaping Use   Vaping Use: Never used  Substance Use Topics   Alcohol use: No    Alcohol/week: 0.0 standard drinks of alcohol   Drug use: No   Social History   Socioeconomic History   Marital status: Divorced    Spouse name: Not on file   Number of children: Not on file   Years of education: Not on file   Highest education level: Not on file  Occupational History   Not on file  Tobacco Use   Smoking status: Former    Packs/day: 1.00    Years: 4.00    Total pack years: 4.00    Types: Cigarettes    Quit date: 05/21/1979    Years since quitting: 43.1   Smokeless tobacco: Never  Vaping Use   Vaping Use: Never used  Substance and Sexual Activity   Alcohol use: No    Alcohol/week: 0.0 standard drinks of alcohol   Drug use: No   Sexual activity: Yes    Birth control/protection: Post-menopausal    Comment: 1st intercourse 66 yo-Fewer than 5 partners  Other Topics Concern   Not on file  Social  History Narrative   Not on file   Social Determinants of Health   Financial Resource Strain: Low Risk  (05/26/2022)   Overall Financial Resource Strain (CARDIA)    Difficulty of Paying Living Expenses: Not hard at all  Food Insecurity: No Food Insecurity (07/16/2022)   Hunger Vital Sign    Worried About Running Out of Food in the Last Year: Never true    Ran Out of Food in the Last Year: Never true  Transportation Needs: No Transportation Needs (07/16/2022)   PRAPARE - Hydrologist (Medical): No    Lack of Transportation (Non-Medical): No  Physical Activity: Inactive (05/26/2022)   Exercise Vital Sign    Days of Exercise per Week: 0 days    Minutes of Exercise per Session: 0 min  Stress: No Stress Concern Present (05/26/2022)   [place]    Feeling of Stress : Not at all  Social Connections: Moderately Isolated (05/26/2022)   Social Connection and Isolation Panel [NHANES]    Frequency of Communication with Friends and Family: More than three times a week    Frequency of Social Gatherings with Friends and Family: More than three times a week    Attends Religious Services: Never    Marine scientist or Organizations: No    Attends Archivist Meetings: Never    Marital Status: Married  Human resources officer Violence: Not At Risk (07/16/2022)   Humiliation, Afraid, Rape, and Kick questionnaire    Fear of Current or Ex-Partner: No    Emotionally Abused: No    Physically Abused: No    Sexually Abused: No   Family Status  Relation Name Status   Mother  Alive   Father  Deceased   Sister  Alive   Daughter  (Not Specified)   Administrator  (  Not Specified)   Other  (Not Specified)   Neg Hx  (Not Specified)   Family History  Problem Relation Age of Onset   Hypertension Mother    Heart disease Mother        s/p pci   Ulcerative colitis Father    Hypertension Father    Heart  attack Father    Diabetes Sister    Cancer Sister        uterine   Pulmonary fibrosis Sister    Ulcerative colitis Daughter    Cancer Maternal Uncle        LUNG   Irritable bowel syndrome Other        grandchildren   Colon cancer Neg Hx    Esophageal cancer Neg Hx    Stomach cancer Neg Hx    Rectal cancer Neg Hx    Allergies  Allergen Reactions   Sulfa Antibiotics Hives, Itching, Rash and Swelling   Morphine Other (See Comments)    "Gives me crazy dreams" (delusions, also)   Sulfonamide Derivatives Hives, Itching and Swelling      Review of Systems  Constitutional:  Negative for chills, fever and malaise/fatigue.  HENT:  Negative for congestion and hearing loss.   Eyes:  Negative for discharge.  Respiratory:  Negative for cough, sputum production and shortness of breath.   Cardiovascular:  Negative for chest pain, palpitations and leg swelling.  Gastrointestinal:  Negative for abdominal pain, blood in stool, constipation, diarrhea, heartburn, nausea and vomiting.  Genitourinary:  Negative for dysuria, frequency, hematuria and urgency.  Musculoskeletal:  Negative for back pain, falls and myalgias.  Skin:  Negative for rash.  Neurological:  Positive for weakness. Negative for dizziness, sensory change, loss of consciousness and headaches.  Endo/Heme/Allergies:  Negative for environmental allergies. Does not bruise/bleed easily.  Psychiatric/Behavioral:  Negative for depression and suicidal ideas. The patient is not nervous/anxious and does not have insomnia.       Objective:     BP 108/70 (BP Location: Left Arm, Patient Position: Sitting, Cuff Size: Normal)   Pulse 94   Temp 97.7 F (36.5 C) (Oral)   Resp 18   Ht 5' 3"  (1.6 m)   Wt 179 lb 9.6 oz (81.5 kg)   SpO2 99%   BMI 31.81 kg/m  BP Readings from Last 3 Encounters:  07/19/22 (!) 147/91  07/15/22 108/70  07/12/22 (!) 148/98   Wt Readings from Last 3 Encounters:  07/16/22 179 lb 7.3 oz (81.4 kg)  07/15/22  179 lb 9.6 oz (81.5 kg)  07/07/22 183 lb 6.8 oz (83.2 kg)   SpO2 Readings from Last 3 Encounters:  07/19/22 99%  07/15/22 99%  07/12/22 92%      Physical Exam Vitals and nursing note reviewed.  Constitutional:      Appearance: She is well-developed.  HENT:     Head: Normocephalic and atraumatic.  Eyes:     Conjunctiva/sclera: Conjunctivae normal.  Neck:     Thyroid: No thyromegaly.     Vascular: No carotid bruit or JVD.  Cardiovascular:     Rate and Rhythm: Normal rate and regular rhythm.     Heart sounds: Normal heart sounds. No murmur heard. Pulmonary:     Effort: Pulmonary effort is normal. No respiratory distress.     Breath sounds: Normal breath sounds. No wheezing or rales.  Chest:     Chest wall: No tenderness.  Musculoskeletal:     Cervical back: Normal range of motion and neck supple.  Neurological:     General: No focal deficit present.     Mental Status: She is alert and oriented to person, place, and time.     Results for orders placed or performed in visit on 07/15/22  CBC with Differential/Platelet  Result Value Ref Range   WBC 12.2 (H) 4.0 - 10.5 K/uL   RBC 3.16 (L) 3.87 - 5.11 Mil/uL   Hemoglobin 10.7 (L) 12.0 - 15.0 g/dL   HCT 32.8 (L) 36.0 - 46.0 %   MCV 103.7 (H) 78.0 - 100.0 fl   MCHC 32.6 30.0 - 36.0 g/dL   RDW 15.9 (H) 11.5 - 15.5 %   Platelets 348.0 150.0 - 400.0 K/uL   Neutrophils Relative % 56.9 43.0 - 77.0 %   Lymphocytes Relative 16.4 12.0 - 46.0 %   Monocytes Relative 4.1 3.0 - 12.0 %   Eosinophils Relative 21.7 (H) 0.0 - 5.0 %   Basophils Relative 0.9 0.0 - 3.0 %   Neutro Abs 6.9 1.4 - 7.7 K/uL   Lymphs Abs 2.0 0.7 - 4.0 K/uL   Monocytes Absolute 0.5 0.1 - 1.0 K/uL   Eosinophils Absolute 2.6 (H) 0.0 - 0.7 K/uL   Basophils Absolute 0.1 0.0 - 0.1 K/uL  Comprehensive metabolic panel  Result Value Ref Range   Sodium 136 135 - 145 mEq/L   Potassium 3.9 3.5 - 5.1 mEq/L   Chloride 97 96 - 112 mEq/L   CO2 25 19 - 32 mEq/L   Glucose,  Bld 133 (H) 70 - 99 mg/dL   BUN 39 (H) 6 - 23 mg/dL   Creatinine, Ser 4.27 (H) 0.40 - 1.20 mg/dL   Total Bilirubin 0.4 0.2 - 1.2 mg/dL   Alkaline Phosphatase 91 39 - 117 U/L   AST 37 0 - 37 U/L   ALT 40 (H) 0 - 35 U/L   Total Protein 7.4 6.0 - 8.3 g/dL   Albumin 4.1 3.5 - 5.2 g/dL   GFR 10.14 (LL) >60.00 mL/min   Calcium 7.7 (L) 8.4 - 10.5 mg/dL  IBC + Ferritin  Result Value Ref Range   Iron 101 42 - 145 ug/dL   Transferrin 278.0 212.0 - 360.0 mg/dL   Saturation Ratios 26.0 20.0 - 50.0 %   Ferritin 194.6 10.0 - 291.0 ng/mL   TIBC 389.2 250.0 - 450.0 mcg/dL    Last CBC Lab Results  Component Value Date   WBC 10.9 (H) 07/19/2022   HGB 9.5 (L) 07/19/2022   HCT 29.5 (L) 07/19/2022   MCV 105.4 (H) 07/19/2022   MCH 33.9 07/19/2022   RDW 14.6 07/19/2022   PLT 317 96/28/3662   Last metabolic panel Lab Results  Component Value Date   GLUCOSE 100 (H) 07/19/2022   NA 140 07/19/2022   K 3.5 07/19/2022   CL 102 07/19/2022   CO2 26 07/19/2022   BUN 30 (H) 07/19/2022   CREATININE 2.92 (H) 07/19/2022   GFRNONAA 17 (L) 07/19/2022   CALCIUM 8.4 (L) 07/19/2022   PHOS 4.3 12/18/2020   PROT 7.8 07/17/2022   ALBUMIN 3.6 07/17/2022   BILITOT 1.0 07/17/2022   ALKPHOS 79 07/17/2022   AST 27 07/17/2022   ALT 36 07/17/2022   ANIONGAP 12 07/19/2022   Last lipids Lab Results  Component Value Date   CHOL 252 (H) 12/08/2021   HDL 48.80 12/08/2021   LDLCALC 116 (H) 02/26/2020   LDLDIRECT 130.0 12/08/2021   TRIG 383.0 (H) 12/08/2021   CHOLHDL 5 12/08/2021   Last hemoglobin A1c  Lab Results  Component Value Date   HGBA1C 5.6 08/23/2020   Last thyroid functions Lab Results  Component Value Date   TSH 3.75 12/08/2021      The 10-year ASCVD risk score (Arnett DK, et al., 2019) is: 26.9%    Assessment & Plan:   Problem List Items Addressed This Visit       Unprioritized   Diarrhea with dehydration    Recheck labs today Return to er if symtpoms worsen       Dehydration     Recheck labs  Pt still feels week       Crohn's disease of both small and large intestine ([place])    Per GI      CKD (chronic kidney disease) stage 4, GFR 15-29 ml/min (HCC)   ANEMIA-IRON DEFICIENCY - Primary   Relevant Orders   CBC with Differential/Platelet (Completed)   Comprehensive metabolic panel (Completed)   IBC + Ferritin (Completed)   Acute renal failure superimposed on stage 4 chronic kidney disease ([place])    Will arrange for Ivf 2x a week Recheck labs  Go to er if symptoms worsen       Other Visit Diagnoses     Primary hypertension       Relevant Orders   CBC with Differential/Platelet (Completed)   Comprehensive metabolic panel (Completed)   IBC + Ferritin (Completed)       No follow-ups on file.    Ann Held, DO

## 2022-07-20 ENCOUNTER — Encounter (HOSPITAL_COMMUNITY)
Admission: EM | Disposition: A | Discharge status: Home / Self Care | Payer: Self-pay | Source: Home / Self Care | Attending: Family Medicine

## 2022-07-20 ENCOUNTER — Encounter (HOSPITAL_COMMUNITY): Payer: Self-pay | Admitting: Internal Medicine

## 2022-07-20 ENCOUNTER — Inpatient Hospital Stay (HOSPITAL_COMMUNITY): Payer: Medicare Other | Admitting: Certified Registered"

## 2022-07-20 DIAGNOSIS — I129 Hypertensive chronic kidney disease with stage 1 through stage 4 chronic kidney disease, or unspecified chronic kidney disease: Secondary | ICD-10-CM | POA: Diagnosis not present

## 2022-07-20 DIAGNOSIS — Z8719 Personal history of other diseases of the digestive system: Secondary | ICD-10-CM

## 2022-07-20 DIAGNOSIS — D631 Anemia in chronic kidney disease: Secondary | ICD-10-CM

## 2022-07-20 DIAGNOSIS — N189 Chronic kidney disease, unspecified: Secondary | ICD-10-CM | POA: Diagnosis not present

## 2022-07-20 DIAGNOSIS — N179 Acute kidney failure, unspecified: Secondary | ICD-10-CM | POA: Diagnosis not present

## 2022-07-20 DIAGNOSIS — R197 Diarrhea, unspecified: Secondary | ICD-10-CM

## 2022-07-20 DIAGNOSIS — E1122 Type 2 diabetes mellitus with diabetic chronic kidney disease: Secondary | ICD-10-CM | POA: Diagnosis not present

## 2022-07-20 DIAGNOSIS — Z87891 Personal history of nicotine dependence: Secondary | ICD-10-CM

## 2022-07-20 HISTORY — PX: ILEOSCOPY: SHX5434

## 2022-07-20 HISTORY — PX: BIOPSY: SHX5522

## 2022-07-20 SURGERY — ILEOSCOPY, THROUGH STOMA
Anesthesia: Monitor Anesthesia Care

## 2022-07-20 MED ORDER — SODIUM CHLORIDE 0.9 % IV SOLN
INTRAVENOUS | Status: DC
  Administered 2022-07-20: 500 mL via INTRAVENOUS

## 2022-07-20 MED ORDER — LIDOCAINE 2% (20 MG/ML) 5 ML SYRINGE
INTRAMUSCULAR | Status: DC | PRN
  Administered 2022-07-20: 40 mg via INTRAVENOUS

## 2022-07-20 MED ORDER — SODIUM CHLORIDE 0.9 % IV SOLN: INTRAVENOUS | Status: DC | PRN

## 2022-07-20 MED ORDER — PROPOFOL 10 MG/ML IV BOLUS
INTRAVENOUS | Status: DC | PRN
  Administered 2022-07-20 (×2): 40 mg via INTRAVENOUS

## 2022-07-20 MED ORDER — PROPOFOL 500 MG/50ML IV EMUL
INTRAVENOUS | Status: DC | PRN
  Administered 2022-07-20: 75 ug/kg/min via INTRAVENOUS

## 2022-07-20 NOTE — Interval H&P Note (Signed)
History and Physical Interval Note:  07/20/2022 1:15 PM  [name]  has presented today for surgery, with the diagnosis of Hx Crohns, r/o recurrence, r/o obstruction/ stricture.  The various methods of treatment have been discussed with the patient and family. After consideration of risks, benefits and other options for treatment, the patient has consented to  Procedure(s): ILEOSCOPY THROUGH STOMA (N/A) as a surgical intervention.  The patient's history has been reviewed, patient examined, no change in status, stable for surgery.  I have reviewed the patient's chart and labs.  Questions were answered to the patient's satisfaction.     Pricilla Riffle. Fuller Plan

## 2022-07-20 NOTE — Transfer of Care (Signed)
Immediate Anesthesia Transfer of Care Note  Patient: [name]  Procedure(s) Performed: ILEOSCOPY THROUGH STOMA BIOPSY  Patient Location: PACU and Endoscopy Unit  Anesthesia Type:MAC  Level of Consciousness: awake  Airway & Oxygen Therapy: Patient Spontanous Breathing and Patient connected to face mask oxygen  Post-op Assessment: Report given to RN and Post -op Vital signs reviewed and stable  Post vital signs: Reviewed and stable  Last Vitals:  Vitals Value Taken Time  BP 143/82 07/20/22 1350  Temp 36.4 C 07/20/22 1350  Pulse 93 07/20/22 1357  Resp 14 07/20/22 1357  SpO2 97 % 07/20/22 1357  Vitals shown include unvalidated device data.  Last Pain:  Vitals:   07/20/22 1350  TempSrc: Temporal  PainSc: 0-No pain         Complications: No notable events documented.

## 2022-07-20 NOTE — Anesthesia Procedure Notes (Signed)
Procedure Name: MAC Date/Time: 07/20/2022 1:21 PM  Performed by: Cynda Familia, CRNAPre-anesthesia Checklist: Patient identified, Emergency Drugs available, Suction available, Patient being monitored and Timeout performed Patient Re-evaluated:Patient Re-evaluated prior to induction Oxygen Delivery Method: Simple face mask Placement Confirmation: positive ETCO2 and breath sounds checked- equal and bilateral Dental Injury: Teeth and Oropharynx as per pre-operative assessment

## 2022-07-20 NOTE — Anesthesia Preprocedure Evaluation (Signed)
Anesthesia Evaluation  Patient identified by MRN, date of birth, ID band Patient awake    Reviewed: Allergy & Precautions, NPO status , Patient's Chart, lab work & pertinent test results  Airway Mallampati: II  TM Distance: >3 FB Neck ROM: Full    Dental  (+) Dental Advisory Given, Teeth Intact   Pulmonary neg shortness of breath, asthma , neg COPD, neg recent URI, former smoker   breath sounds clear to auscultation       Cardiovascular hypertension, Pt. on medications (-) angina (-) Past MI and (-) CHF  Rhythm:Regular     Neuro/Psych neg Seizures PSYCHIATRIC DISORDERS Anxiety Depression     Neuromuscular disease    GI/Hepatic Neg liver ROS, PUD,GERD  ,,  Endo/Other  diabetes    Renal/GU CRFRenal diseaseLab Results      Component                Value               Date                      CREATININE               2.92 (H)            07/19/2022                Musculoskeletal   Abdominal   Peds  Hematology  (+) Blood dyscrasia, anemia Lab Results      Component                Value               Date                      WBC                      10.9 (H)            07/19/2022                HGB                      9.5 (L)             07/19/2022                HCT                      29.5 (L)            07/19/2022                MCV                      105.4 (H)           07/19/2022                PLT                      317                 07/19/2022              Anesthesia Other Findings   Reproductive/Obstetrics  Anesthesia Physical Anesthesia Plan  ASA: 3  Anesthesia Plan: MAC   Post-op Pain Management: Minimal or no pain anticipated   Induction: Intravenous  PONV Risk Score and Plan: 2 and Propofol infusion and Treatment may vary due to age or medical condition  Airway Management Planned: Nasal Cannula and Natural Airway  Additional Equipment:  None  Intra-op Plan:   Post-operative Plan:   Informed Consent: I have reviewed the patients History and Physical, chart, labs and discussed the procedure including the risks, benefits and alternatives for the proposed anesthesia with the patient or authorized representative who has indicated his/her understanding and acceptance.     Dental advisory given  Plan Discussed with: CRNA  Anesthesia Plan Comments:          Anesthesia Quick Evaluation

## 2022-07-20 NOTE — Progress Notes (Addendum)
Patient ID: [name], female   DOB: 1956-02-05, 66 y.o.   MRN: [id number]    Progress Note   Subjective   Day # 4  CC; recurrent nausea and vomiting with episodes of high ileostomy output, AKI  No new labs today Sed rate 60/CRP 0.7 fecal calprotectin- pending  Patient has not had any recurrent episodes of nausea vomiting or increased ileostomy output.  No current abdominal pain, feels okay, liquid ileostomy output    Objective   Vital signs in last 24 hours: Temp:  [97.8 F (36.6 C)-98.4 F (36.9 C)] 97.8 F (36.6 C) (11/14 0831) Pulse Rate:  [80-106] 80 (11/14 0831) Resp:  [16-18] 18 (11/14 0831) BP: (147-175)/(91-102) 175/102 (11/14 0831) SpO2:  [96 %-100 %] 100 % (11/14 0831) Last BM Date : 07/20/22 General:    Older white female in NAD Heart:  Regular rate and rhythm; no murmurs Lungs: Respirations even and unlabored, lungs CTA bilaterally Abdomen:  Soft, nontender and nondistended. Normal bowel sounds.  Ileostomy right lower quadrant Extremities:  Without edema. Neurologic:  Alert and oriented,  grossly normal neurologically. Psych:  Cooperative. Normal mood and affect.  Intake/Output from previous day: 11/13 0701 - 11/14 0700 In: 5084.2 [P.O.:480; I.V.:4604.2] Out: 3725 [Urine:2700; Stool:1025] Intake/Output this shift: Total I/O In: -  Out: 900 [Urine:700; Stool:200]  Lab Results: Recent Labs    07/19/22 0437  WBC 10.9*  HGB 9.5*  HCT 29.5*  PLT 317   BMET Recent Labs    07/19/22 0437  NA 140  K 3.5  CL 102  CO2 26  GLUCOSE 100*  BUN 30*  CREATININE 2.92*  CALCIUM 8.4*   LFT No results for input(s): "PROT", "ALBUMIN", "AST", "ALT", "ALKPHOS", "BILITOT", "BILIDIR", "IBILI" in the last 72 hours. PT/INR No results for input(s): "LABPROT", "INR" in the last 72 hours.  Studies/Results: No results found.     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 Unable to achieve remission with TNF therapy, or Entyvio/6-MP and ultimately underwent diverting ileostomy August 2022 and then had J-pouch excision and creation of end ileostomy January 2023 Dr. Drue Flirt  Had been doing well over the past several months until she has developed recurrent episodes of nausea and vomiting followed by 3 to 4 hours of high ileostomy output but no associated abdominal distention or pain.  She has had associated weakness 1 episode of syncope and has developed acute kidney injury in association with these episodes Is her third admission with similar symptoms over the past couple of months  He does have chronic stage IV kidney disease with baseline creatinine between 2 and 3  Does not have chronic high ileostomy output and does not have a diagnosis of short gut syndrome  Concern at present is for recurrent partial obstruction possibly secondary to adhesions versus active IBD though that is felt less likely  #2 chronic kidney disease stage IV-AKI improved since admission  Will benefit from outpatient IV fluids if we can get that arranged through the infusion center 2-3 times weekly  #3 recurrent iron deficiency anemia felt secondary to IBD though she has not had evidence of active IBD over this past year-receives periodic iron infusions  Plan: Patient is scheduled for ileoscopy today with Dr. Fuller Plan If no definitive findings at ileoscopy then schedule for CT enterography  for tomorrow with oral but no IV contrast or SBFT    LOS: 4 days   Amy EsterwoodPA-C  07/20/2022, 9:05 AM    Attending Physician Note   I have taken an interval history, reviewed the chart and examined the patient. I performed a substantive portion of this encounter, including complete performance of at least one of the key components, in conjunction with the  APP. I agree with the APP's note, impression and recommendations.   Lucio Edward, MD [facility] See [facility], [facility], for our on call provider

## 2022-07-20 NOTE — Care Management Important Message (Signed)
Important Message  Patient Details  Name: [name] MRN: [id number] Date of Birth: 02-07-1956   Medicare Important Message Given:  Yes     Memory Argue 07/20/2022, 11:44 AM

## 2022-07-20 NOTE — Op Note (Signed)
[facility] Patient Name: [name] Procedure Date: 07/20/2022 MRN: [id number] Attending MD: Ladene Artist , MD, [id number] Date of Birth: 1956/07/07 CSN: [id number] Age: 66 Admit Type: Inpatient Procedure:                Ileoscopy Indications:              Intermittent high ostomy output, intermittent                            nausea/vomiting, Diarrhea Providers:                Pricilla Riffle. Fuller Plan, MD, Jaci Carrel, RN, Cherylynn Ridges, Technician, Glenis Smoker, CRNA Referring MD:             [facility] Medicines:                Monitored Anesthesia Care Complications:            No immediate complications. Estimated Blood Loss:     Estimated blood loss was minimal. Procedure:                Pre-Anesthesia Assessment:                           - Prior to the procedure, a History and Physical                            was performed, and patient medications and                            allergies were reviewed. The patient's tolerance of                            previous anesthesia was also reviewed. The risks                            and benefits of the procedure and the sedation                            options and risks were discussed with the patient.                            All questions were answered, and informed consent                            was obtained. Prior Anticoagulants: The patient has                            taken no anticoagulant or antiplatelet agents. ASA                            Grade Assessment: III - A patient with severe  systemic disease. After reviewing the risks and                            benefits, the patient was deemed in satisfactory                            condition to undergo the procedure.                           After obtaining informed consent, the colonoscope                            was passed under direct vision. Throughout the                             procedure, the patient's blood pressure, pulse, and                            oxygen saturations were monitored continuously. The                            [id number]-HQ190L ([id number]) Olympus colonoscope was                            introduced through the anus and advanced to the the                            terminal ileum. The colonoscopy was performed                            without difficulty. The patient tolerated the                            procedure well. Scope In: 1:35:40 PM Scope Out: 1:41:38 PM Total Procedure Duration: 0 hours 5 minutes 58 seconds  Findings:      The ileostomy appears normal and the neo-terminal ileum appeared normal.       25 cm examined. Biopsies were taken with a cold forceps for histology. Impression:               - The ileostomy and examined portion of the ileum                            was normal. Biopsied. Moderate Sedation:      Not Applicable - Patient had care per Anesthesia. Recommendation:           - Return patient to hospital ward for ongoing care.                           - Resume previous diet.                           - Continue present medications.                           - Proceed with  CT enterography.                           - Await pathology results. Procedure Code(s):        --- Professional ---                           [phone], Colonoscopy, flexible; with biopsy, single                            or multiple Diagnosis Code(s):        --- Professional ---                           R19.7, Diarrhea, unspecified CPT copyright 2022 [employer]. All rights reserved. The codes documented in this report are preliminary and upon coder review may  be revised to meet current compliance requirements. Ladene Artist, MD 07/20/2022 2:02:58 PM This report has been signed electronically. Number of Addenda: 0

## 2022-07-20 NOTE — Progress Notes (Signed)
PROGRESS NOTE    [name]  XBM:[id number] DOB: [birth year] DOA: 07/16/2022  PCP: Ann Held, DO    Brief Narrative:  This 66 years old female with PMH significant for CKD stage IV, Crohn's disease with ileostomy, short gut syndrome, GERD, hypertension presented in the ED with complaints of nausea, vomiting and hypotension.  She was recently discharged from hospital on 07/12/2022 after hospitalization for syncope. She was found to be orthostatic and with AKI on CKD 4 likely from high output from her ileostomy.  She recovered with fluid resuscitation and then she was discharged.  Patient reports she was doing better for few days and then she started having nausea and vomiting,  increased output from her ileostomy.  She denies any fever,  abdominal pain or sick contacts. Patient is admitted for AKI on CKD stage IV secondary to nausea,  vomiting and high output from ileostomy. Nephrology is consulted. AKI improved with IV hydration.  Patient continued to have high output ileostomy.  Gastroenterology is consulted patient is a scheduled for ileoscopy today.  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 Patient presented with persistent nausea, vomiting, high output from her ileostomy. Likely from high output ileostomy and nausea and vomiting. She does have a history of short gut syndrome. X-ray shows paucity of small bowel gas without evidence of obstructed loops.,  No free air,  She was started on aggressive IV fluid resuscitation. Continued on sodium bicarbonate. Avoid nephrotoxic medication, creatinine is  improving. 5.06>4.40>2.92 AKI resolved.  Metabolic acidosis resolved.  Serum creatinine at baseline.  Nausea and vomiting: > Improving Continue IV fluid resuscitation, Continue IV compazine Started on clear liquid diet, advanced to full liquid.  High output ileostomy/short gut syndrome: Continue Imodium. Continue Questran She continues to have increased output from ileostomy,  GI is consulted.  Patient is  scheduled for ileoscopy today. If no definitive findings on ileoscopy then patient will be scheduled for CT enterography tomorrow.  Chronic leucocytosis: > Improving Denies any fever, no signs of any infection. Hold on antibiotics at this time.  GERD: Continue pantoprazole 40 mg daily  Essential hypertension: Hold blood pressure medications and blood pressure is on soft side. Resume home blood pressure medications.  DVT prophylaxis: SCDs Code Status: Full code Family Communication: No family at bed side Disposition Plan:   Status is: Inpatient Remains inpatient appropriate because: Admitted for nausea, vomiting and high output from ileostomy leading to AKI on CKD stage IV.  Patient needs aggressive IV fluid resuscitation.  Patient still has high output ileostomy.  GI is consulted for further recommendation.  Patient is a scheduled for ileoscopy today.    Consultants:  Nephrology  Procedures: None Antimicrobials: None  Subjective: Patient was seen and examined at bedside.Overnight events noted. Patient reports nausea and vomiting has improved. She also reports slight improvement in output from ileostomy. Renal functions has improved.  She tolerated full liquid diet.  Advanced to soft diet. She is scheduled to have ileoscopy today.  Objective: Vitals:   07/19/22 2041 07/20/22 0509 07/20/22 0831 07/20/22 1100  BP: Marland Kitchen)  149/93 (!) 167/94 (!) 175/102 (!) 158/100  Pulse: 91 85 80 85  Resp: 16 18 18    Temp: 98 F (36.7 C) 97.8 F (36.6 C) 97.8 F (36.6 C)   TempSrc:  Oral Oral Oral   SpO2: 96% 100% 100% 100%  Weight:      Height:        Intake/Output Summary (Last 24 hours) at 07/20/2022 1200 Last data filed at 07/20/2022 1100 Gross per 24 hour  Intake 2831.06 ml  Output 4925 ml  Net -2093.94 ml   Filed Weights   07/16/22 1125  Weight: 81.4 kg    Examination:  General exam: Appears comfortable, not in any acute distress, deconditioned. Respiratory system: CTA bilaterally, respiratory effort normal, RR 13. Cardiovascular system: S1 & S2 heard, regular rate and rhythm, no murmur. Gastrointestinal system: Abdomen is soft, nontender, nondistended, BS+, ileostomy+ Central nervous system: Alert and oriented x 3, no focal neurological deficits. Extremities: No edema, no cyanosis, no clubbing. Skin: No rashes, lesions or ulcers Psychiatry: Judgement and insight appear normal. Mood & affect appropriate.     Data Reviewed: I have personally reviewed following labs and imaging studies  CBC: Recent Labs  Lab 07/15/22 1235 07/16/22 1158 07/17/22 0446 07/19/22 0437  WBC 12.2* 16.8* 19.4* 10.9*  NEUTROABS 6.9 12.5*  --   --   HGB 10.7* 12.3 10.3* 9.5*  HCT 32.8* 38.7 31.6* 29.5*  MCV 103.7* 106.3* 104.3* 105.4*  PLT 348.0 362 302 387   Basic Metabolic Panel: Recent Labs  Lab 07/15/22 1235 07/16/22 1158 07/17/22 0446 07/19/22 0437  NA 136 136 135 140  K 3.9 4.7 3.6 3.5  CL 97 102 103 102  CO2 25 18* 21* 26  GLUCOSE 133* 122* 101* 100*  BUN 39* 56* 47* 30*  CREATININE 4.27* 5.06* 4.40* 2.92*  CALCIUM 7.7* 8.6* 8.1* 8.4*   GFR: Estimated Creatinine Clearance: 18.6 mL/min (A) (by C-G formula based on SCr of 2.92 mg/dL (H)). Liver Function Tests: Recent Labs  Lab 07/15/22 1235 07/16/22 1158 07/17/22 0446  AST 37 37 27  ALT 40* 46* 36  ALKPHOS 91 90 79  BILITOT 0.4 0.7 1.0  PROT 7.4 9.2* 7.8  ALBUMIN 4.1 4.2 3.6   No results for input(s): "LIPASE", "AMYLASE" in the last 168 hours. No results for input(s): "AMMONIA" in the  last 168 hours. Coagulation Profile: No results for input(s): "INR", "PROTIME" in the last 168 hours. Cardiac Enzymes: No results for input(s): "CKTOTAL", "CKMB", "CKMBINDEX", "TROPONINI" in the last 168 hours. BNP (last 3 results) No results for input(s): "PROBNP" in the last 8760 hours. HbA1C: No results for input(s): "HGBA1C" in the last 72 hours. CBG: No results for input(s): "GLUCAP" in the last 168 hours. Lipid Profile: No results for input(s): "CHOL", "HDL", "LDLCALC", "TRIG", "CHOLHDL", "LDLDIRECT" in the last 72 hours. Thyroid Function Tests: No results for input(s): "TSH", "T4TOTAL", "FREET4", "T3FREE", "THYROIDAB" in the last 72 hours. Anemia Panel: No results for input(s): "VITAMINB12", "FOLATE", "FERRITIN", "TIBC", "IRON", "RETICCTPCT" in the last 72 hours.  Sepsis Labs: No results for input(s): "PROCALCITON", "LATICACIDVEN" in the last 168 hours.  No results found for this or any previous visit (from the past 240 hour(s)).   Radiology Studies: No results found.  Scheduled Meds:  amLODipine  5 mg Oral Daily   cholestyramine light  4 g Oral TID PC   DULoxetine  120 mg Oral Daily   folic acid  1 mg Oral Daily   loperamide  4 mg Oral QHS  metoprolol succinate  50 mg Oral BID   sodium bicarbonate  650 mg Oral TID   Continuous Infusions:  lactated ringers 125 mL/hr at 07/20/22 0200     LOS: 4 days    Time spent: 35 mins    Shamra Bradeen, MD [facility]   If 7PM-7AM, please contact night-coverage

## 2022-07-21 ENCOUNTER — Other Ambulatory Visit: Payer: Self-pay

## 2022-07-21 ENCOUNTER — Inpatient Hospital Stay (HOSPITAL_COMMUNITY): Payer: Medicare Other

## 2022-07-21 DIAGNOSIS — E860 Dehydration: Secondary | ICD-10-CM

## 2022-07-21 DIAGNOSIS — Z932 Ileostomy status: Secondary | ICD-10-CM

## 2022-07-21 DIAGNOSIS — R198 Other specified symptoms and signs involving the digestive system and abdomen: Secondary | ICD-10-CM

## 2022-07-21 DIAGNOSIS — N179 Acute kidney failure, unspecified: Secondary | ICD-10-CM | POA: Diagnosis not present

## 2022-07-21 LAB — CREATININE, SERUM
Creatinine, Ser: 2.76 mg/dL — ABNORMAL HIGH (ref 0.44–1.00)
GFR, Estimated: 18 mL/min — ABNORMAL LOW (ref >60)

## 2022-07-21 LAB — CBC
HCT: 29.2 % — ABNORMAL LOW (ref 36.0–46.0)
Hemoglobin: 9.2 g/dL — ABNORMAL LOW (ref 12.0–15.0)
MCH: 33.5 pg (ref 26.0–34.0)
MCHC: 31.5 g/dL (ref 30.0–36.0)
MCV: 106.2 fL — ABNORMAL HIGH (ref 80.0–100.0)
Platelets: 313 10*3/uL (ref 150–400)
RBC: 2.75 MIL/uL — ABNORMAL LOW (ref 3.87–5.11)
RDW: 14.6 % (ref 11.5–15.5)
WBC: 9.9 10*3/uL (ref 4.0–10.5)
nRBC: 0 % (ref 0.0–0.2)

## 2022-07-21 LAB — C-REACTIVE PROTEIN: CRP: 0.6 mg/dL (ref <1.0)

## 2022-07-21 LAB — SURGICAL PATHOLOGY

## 2022-07-21 LAB — CALPROTECTIN, FECAL: Calprotectin, Fecal: 5 ug/g (ref 0–120)

## 2022-07-21 LAB — SEDIMENTATION RATE
Sed Rate: 56 mm/hr — ABNORMAL HIGH (ref 0–22)
Sed Rate: 29 mm/hr — ABNORMAL HIGH (ref 0–22)

## 2022-07-21 MED ORDER — CHOLESTYRAMINE LIGHT 4 G PO PACK: 4.0000 g | PACK | Freq: Three times a day (TID) | ORAL | 1 refills | Status: DC

## 2022-07-21 NOTE — Plan of Care (Signed)
Patient is stable for discharge. Discharge instructions given, patient understood instructions. All questions answered. Patient is discharged home with family.

## 2022-07-21 NOTE — Discharge Summary (Signed)
Physician Discharge Summary  [name] BBC:[id number] DOB: 25-May-1956 DOA: 07/16/2022  PCP: Ann Held, DO  Admit date: 07/16/2022  Discharge date: 07/21/2022  Admitted From: Home.  Disposition:  Home.  Recommendations for Outpatient Follow-up:  Follow up with PCP in 1-2 weeks Please obtain BMP/CBC in one week Advised to follow-up with Dr. Havery Moros on 07/26/2022. She will need capsule endoscopy as an outpatient.  Home Health:None Equipment/Devices:None  Discharge Condition: Stable CODE STATUS:Limited code Diet recommendation: Heart Healthy  Brief [facility] Course: This 66 years old female with PMH significant for CKD stage IV, Crohn's disease with ileostomy, short gut syndrome, GERD, hypertension presented in the ED with complaints of nausea, vomiting and hypotension. She was recently discharged from hospital on 07/12/2022 after hospitalization for syncope. She was found to be orthostatic and with AKI on CKD 4 likely from high output from her ileostomy.  She recovered with fluid resuscitation and then she was discharged. Patient reports she was doing better for few days and then she started having nausea and vomiting,  increased output from her ileostomy.  She denies any fever,  abdominal pain or sick contacts. Patient was admitted for AKI on CKD stage IV secondary to nausea,  vomiting and high output from ileostomy. Nephrology is consulted. AKI improved with IV hydration. Patient continued to have high output ileostomy.  Gastroenterology was consulted , patient underwent ileoscopy which was unremarkable.  GI recommended CT enterography which could not happen.  Patient reported improvement in symptoms.  She denies nausea, vomiting and she reports output from ileostomy has significantly improved.  Patient tolerated soft diet.  GI signed off , recommended Patient can be discharged home and patient has appointment with Dr. Havery Moros on 07/26/2022 and she will need  capsule endoscopy as an outpatient.  Patient is being discharged home.   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 Patient presented with persistent nausea, vomiting, high output from her ileostomy. Likely from high output ileostomy and nausea and vomiting. She does have a history of short gut syndrome. X-ray shows paucity of small bowel gas without evidence of obstructed loops.,  No free air,  She was started on aggressive IV fluid resuscitation. Continued on sodium bicarbonate. Avoid nephrotoxic medication, Creatinine has improved . 5.06>4.40>2.92 AKI resolved.  Metabolic acidosis resolved.  Serum creatinine at baseline.   Nausea and vomiting: > Improving Continue IV fluid resuscitation, Continue IV compazine Started on clear liquid diet, advanced to full liquid.   High output ileostomy/short gut syndrome: Continue Imodium. Continue Questran She continues to have increased output from ileostomy,  GI is consulted.  Patient underwent ileoscopy which was unremarkable. Patient was scheduled for CT enterography which was canceled.  Since patient's symptoms resolved.   Chronic leucocytosis: > Resolved. Denies any fever, no signs of any infection. Hold on antibiotics at this time.   GERD: Continue pantoprazole 40 mg daily   Essential hypertension: Hold blood pressure medications and blood pressure is on soft side. Resume home blood pressure medications.    Discharge Instructions  Discharge Instructions     Call MD  for:  difficulty breathing, headache or visual disturbances   Complete by: As directed     Call MD for:  persistant dizziness or light-headedness   Complete by: As directed    Call MD for:  persistant nausea and vomiting   Complete by: As directed    Diet - low sodium heart healthy   Complete by: As directed    Diet Carb Modified   Complete by: As directed    Discharge instructions   Complete by: As directed    Advised to follow-up with primary care physician in 1 week. Advised to follow-up with Dr. Havery Moros on 07/26/2022. She will need capsule endoscopy as an outpatient.   Increase activity slowly   Complete by: As directed       Allergies as of 07/21/2022       Reactions   Sulfa Antibiotics Hives, Itching, Rash, Swelling   Morphine Other (See Comments)   "Gives me crazy dreams" (delusions, also)   Sulfonamide Derivatives Hives, Itching, Swelling        Medication List     TAKE these medications    acetaminophen 500 MG tablet Commonly known as: TYLENOL Take 1,000 mg by mouth at bedtime as needed (for pain/sleep).   allopurinol 300 MG tablet Commonly known as: ZYLOPRIM TAKE ONE TABLET BY MOUTH DAILY AT 9AM What changed: See the new instructions.   amLODipine 5 MG tablet Commonly known as: NORVASC Take 5 mg by mouth daily.   Benadryl Allergy 25 MG tablet Generic drug: diphenhydrAMINE Take 25 mg by mouth at bedtime.   cholestyramine light 4 g packet Commonly known as: PREVALITE Take 1 packet (4 g total) by mouth 3 (three) times daily after meals.   DULoxetine 60 MG capsule Commonly known as: CYMBALTA TAKE TWO CAPSULES (120MG) BY MOUTH DAILY AT 9AM What changed: See the new instructions.   folic acid 1 MG tablet Commonly known as: FOLVITE Take 2 tablets (2 mg total) by mouth daily. What changed: how much to take   Gas-X Extra Strength 125 MG Caps Generic drug: Simethicone Take 125 mg by mouth at bedtime.   loperamide 2 MG tablet Commonly known as: IMODIUM A-D Take 4 mg by mouth at bedtime.   metoprolol succinate 100 MG 24 hr  tablet Commonly known as: TOPROL-XL Take 50 mg by mouth See admin instructions. Take 50 mg by mouth in the morning and afternoon   OVER THE COUNTER MEDICATION Take 1 tablet by mouth See admin instructions. Nervive Nerve Health tablet- Take 1 tablet by mouth at bedtime   TUMS EXTRA STRENGTH 750 PO Take 2 tablets by mouth daily as needed (CHEW for heartburn).   Vitamin D (Ergocalciferol) 1.25 MG (50000 UNIT) Caps capsule Commonly known as: DRISDOL Take 50,000 Units by mouth every 30 (thirty) days.        Follow-up Information     Roma Schanz R, DO Follow up in 1 week(s).   Specialty: Family Medicine Contact information: [address] [place] [address] [phone]         Yetta Flock, MD Follow up in 1 week(s).   Specialty: Gastroenterology Contact information: [address] [place] [address] [phone]                Allergies  Allergen Reactions   Sulfa Antibiotics Hives, Itching, Rash and Swelling   Morphine Other (See Comments)    "Gives me crazy dreams" (delusions, also)   Sulfonamide Derivatives  Hives, Itching and Swelling    Consultations: Gastroenterology   Procedures/Studies: DG SMALL BOWEL W DOUBLE CM (HD)  Result Date: 07/21/2022 CLINICAL DATA:  Crohn disease, prior colectomy and partial ileal resection. Now with increased output through ostomy. Looking for progression of Crohn disease, unable to have CT due to renal function. 1 day s/p ileoscopy with biopsy. EXAM: SMALL BOWEL SERIES COMPARISON:  CT 06/29/22. TECHNIQUE: Following ingestion of barium, serial small bowel images were obtained including spot views of the neo-terminal ileum. FLUOROSCOPY: Radiation Exposure Index (if provided by the fluoroscopic device): 4.60 mGy Ref. Air Kerma FINDINGS: Scout image without obstructive bowel gas pattern. Surgical clips in the right upper quadrant. Dextroconvex rotatory scoliosis. Visualized lung bases  are grossly clear. Normal small bowel luminal size and fold pattern. No apparent mucosal abnormality. No evidence for stricture or obstruction. Contrast traverses stomach to ileostomy within 95 minutes. IMPRESSION: Right mid abdominal ileostomy. No findings to indicate disease progression. Read and performed by: Alexandria Lodge, PA-C Supervised and interpreted by: Lorin Picket, MD Electronically Signed   By: Lorin Picket M.D.   On: 07/21/2022 14:10   DG Abd 2 Views  Result Date: 07/16/2022 CLINICAL DATA:  Vomiting EXAM: ABDOMEN - 2 VIEW COMPARISON:  None Available. FINDINGS: General paucity of small bowel gas without evidence of distended or obstructed loops. Scattered gas in the distal colon to the rectum. No free air. No radio-opaque calculi or other significant radiographic abnormality is seen. IMPRESSION: General paucity of small bowel gas without evidence of distended or obstructed loops. Scattered gas in the distal colon to the rectum. No free air. Electronically Signed   By: Delanna Ahmadi M.D.   On: 07/16/2022 13:26   CT ABDOMEN PELVIS WO CONTRAST  Result Date: 06/29/2022 CLINICAL DATA:  Acute left upper quadrant abdominal pain. EXAM: CT ABDOMEN AND PELVIS WITHOUT CONTRAST TECHNIQUE: Multidetector CT imaging of the abdomen and pelvis was performed following the standard protocol without IV contrast. RADIATION DOSE REDUCTION: This exam was performed according to the departmental dose-optimization program which includes automated exposure control, adjustment of the mA and/or kV according to patient size and/or use of iterative reconstruction technique. COMPARISON:  September 22, 2021. FINDINGS: Lower chest: No acute abnormality. Hepatobiliary: No focal liver abnormality is seen. Status post cholecystectomy. No biliary dilatation. Pancreas: Unremarkable. No pancreatic ductal dilatation or surrounding inflammatory changes. Spleen: Normal in size without focal abnormality. Adrenals/Urinary Tract:  Adrenal glands are unremarkable. Kidneys are normal, without renal calculi, focal lesion, or hydronephrosis. Bladder is unremarkable. Stomach/Bowel: The stomach appears normal. Status post colectomy with ileostomy seen in right lower quadrant. No abnormal bowel dilatation or inflammation is noted. Vascular/Lymphatic: No significant vascular findings are present. No enlarged abdominal or pelvic lymph nodes. Reproductive: Status post hysterectomy. No adnexal masses. Other: No significant hernia or ascites is noted. Musculoskeletal: No acute or significant osseous findings. IMPRESSION: Status post colectomy with ileostomy seen in right lower quadrant. No abnormal bowel dilatation or inflammation is noted. No acute abnormality seen in the abdomen or pelvis. Electronically Signed   By: Marijo Conception M.D.   On: 06/29/2022 12:24   DG Chest Portable 1 View  Result Date: 06/29/2022 CLINICAL DATA:  Hypotension. Chronic left upper quadrant abdominal pain. EXAM: PORTABLE CHEST 1 VIEW COMPARISON:  Chest x-ray dated September 22, 2021. FINDINGS: The heart size and mediastinal contours are within normal limits. Both lungs are clear. The visualized skeletal structures are unremarkable. IMPRESSION: No active disease. Electronically Signed   By: Titus Dubin  M.D.   On: 06/29/2022 11:09     Subjective: Patient was seen and examined at bedside.  Overnight events noted.   She reports nausea and vomiting has resolved. Output from ileostomy has significantly improved.   Patient wants to be discharged.  Patient is being discharged home.  Discharge Exam: Vitals:   07/21/22 0615 07/21/22 1332  BP: 134/85 133/82  Pulse: 82 89  Resp: 18 17  Temp: 97.9 F (36.6 C) 98.1 F (36.7 C)  SpO2: 97% 100%   Vitals:   07/20/22 2208 07/21/22 0148 07/21/22 0615 07/21/22 1332  BP: (!) 143/85 (!) 179/93 134/85 133/82  Pulse: (!) 105 86 82 89  Resp: 16 19 18 17   Temp: 98.1 F (36.7 C) 98.2 F (36.8 C) 97.9 F (36.6 C) 98.1  F (36.7 C)  TempSrc: Oral Oral Oral Oral  SpO2: 94% 97% 97% 100%  Weight:      Height:        General: Pt is alert, awake, not in acute distress Cardiovascular: RRR, S1/S2 +, no rubs, no gallops Respiratory: CTA bilaterally, no wheezing, no rhonchi Abdominal: Soft, NT, ND, bowel sounds + Extremities: no edema, no cyanosis    The results of significant diagnostics from this hospitalization (including imaging, microbiology, ancillary and laboratory) are listed below for reference.     Microbiology: Recent Results (from the past 240 hour(s))  Calprotectin, Fecal     Status: None   Collection Time: 07/19/22  2:47 PM   Specimen: Stool  Result Value Ref Range Status   Calprotectin, Fecal 5 0 - 120 ug/g Final    Comment: (NOTE) Concentration     Interpretation   Follow-Up < 5 - 50 ug/g     Normal           None >50 -120 ug/g     Borderline       Re-evaluate in 4-6 weeks    >120 ug/g     Abnormal         Repeat as clinically                                   indicated Performed At: [facility] [place], [place] [id number] Rush Farmer MD IO:[id number]      Labs: BNP (last 3 results) No results for input(s): "BNP" in the last 8760 hours. Basic Metabolic Panel: Recent Labs  Lab 07/15/22 1235 07/16/22 1158 07/17/22 0446 07/19/22 0437 07/21/22 0419  NA 136 136 135 140  --   K 3.9 4.7 3.6 3.5  --   CL 97 102 103 102  --   CO2 25 18* 21* 26  --   GLUCOSE 133* 122* 101* 100*  --   BUN 39* 56* 47* 30*  --   CREATININE 4.27* 5.06* 4.40* 2.92* 2.76*  CALCIUM 7.7* 8.6* 8.1* 8.4*  --    Liver Function Tests: Recent Labs  Lab 07/15/22 1235 07/16/22 1158 07/17/22 0446  AST 37 37 27  ALT 40* 46* 36  ALKPHOS 91 90 79  BILITOT 0.4 0.7 1.0  PROT 7.4 9.2* 7.8  ALBUMIN 4.1 4.2 3.6   No results for input(s): "LIPASE", "AMYLASE" in the last 168 hours. No results for input(s): "AMMONIA" in the last 168 hours. CBC: Recent Labs  Lab 07/15/22 1235  07/16/22 1158 07/17/22 0446 07/19/22 0437 07/21/22 0419  WBC 12.2* 16.8* 19.4* 10.9* 9.9  NEUTROABS 6.9 12.5*  --   --   --  HGB 10.7* 12.3 10.3* 9.5* 9.2*  HCT 32.8* 38.7 31.6* 29.5* 29.2*  MCV 103.7* 106.3* 104.3* 105.4* 106.2*  PLT 348.0 362 302 317 313   Cardiac Enzymes: No results for input(s): "CKTOTAL", "CKMB", "CKMBINDEX", "TROPONINI" in the last 168 hours. BNP: Invalid input(s): "POCBNP" CBG: No results for input(s): "GLUCAP" in the last 168 hours. D-Dimer No results for input(s): "DDIMER" in the last 72 hours. Hgb A1c No results for input(s): "HGBA1C" in the last 72 hours. Lipid Profile No results for input(s): "CHOL", "HDL", "LDLCALC", "TRIG", "CHOLHDL", "LDLDIRECT" in the last 72 hours. Thyroid function studies No results for input(s): "TSH", "T4TOTAL", "T3FREE", "THYROIDAB" in the last 72 hours.  Invalid input(s): "FREET3" Anemia work up No results for input(s): "VITAMINB12", "FOLATE", "FERRITIN", "TIBC", "IRON", "RETICCTPCT" in the last 72 hours. Urinalysis    Component Value Date/Time   COLORURINE YELLOW 07/16/2022 1703   APPEARANCEUR CLOUDY (A) 07/16/2022 1703   LABSPEC 1.014 07/16/2022 1703   PHURINE 5.0 07/16/2022 1703   GLUCOSEU NEGATIVE 07/16/2022 1703   GLUCOSEU NEGATIVE 03/14/2009 1603   HGBUR NEGATIVE 07/16/2022 1703   BILIRUBINUR NEGATIVE 07/16/2022 1703   BILIRUBINUR negative 03/05/2022 1121   KETONESUR NEGATIVE 07/16/2022 1703   PROTEINUR 100 (A) 07/16/2022 1703   UROBILINOGEN 0.2 03/05/2022 1121   UROBILINOGEN 0.2 04/12/2013 0900   NITRITE NEGATIVE 07/16/2022 1703   LEUKOCYTESUR MODERATE (A) 07/16/2022 1703   Sepsis Labs Recent Labs  Lab 07/16/22 1158 07/17/22 0446 07/19/22 0437 07/21/22 0419  WBC 16.8* 19.4* 10.9* 9.9   Microbiology Recent Results (from the past 240 hour(s))  Calprotectin, Fecal     Status: None   Collection Time: 07/19/22  2:47 PM   Specimen: Stool  Result Value Ref Range Status   Calprotectin, Fecal 5 0 -  120 ug/g Final    Comment: (NOTE) Concentration     Interpretation   Follow-Up < 5 - 50 ug/g     Normal           None >50 -120 ug/g     Borderline       Re-evaluate in 4-6 weeks    >120 ug/g     Abnormal         Repeat as clinically                                   indicated Performed At: [facility] [place], [place] [id number] Rush Farmer MD WU:[id number]      Time coordinating discharge: Over 30 minutes  SIGNED:   Shawna Clamp, MD  [facility] 07/21/2022, 5:10 PM Pager   If 7PM-7AM, please contact night-coverage

## 2022-07-21 NOTE — Progress Notes (Signed)
PROGRESS NOTE    [name]  PYP:[id number] DOB: 1956/01/18 DOA: 07/16/2022  PCP: Ann Held, DO    Brief Narrative:  This 66 years old female with PMH significant for CKD stage IV, Crohn's disease with ileostomy, short gut syndrome, GERD, hypertension presented in the ED with complaints of nausea, vomiting and hypotension.  She was recently discharged from hospital on 07/12/2022 after hospitalization for syncope. She was found to be orthostatic and with AKI on CKD 4 likely from high output from her ileostomy.  She recovered with fluid resuscitation and then she was discharged.  Patient reports she was doing better for few days and then she started having nausea and vomiting,  increased output from her ileostomy.  She denies any fever,  abdominal pain or sick contacts. Patient was admitted for AKI on CKD stage IV secondary to nausea,  vomiting and high output from ileostomy. Nephrology is consulted. AKI improved with IV hydration. Patient continued to have high output ileostomy.  Gastroenterology is consulted , patient underwent ileoscopy which was unremarkable.  GI recommended CT and urography.  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 Patient presented with persistent nausea, vomiting, high output from her ileostomy. Likely from high output ileostomy and nausea and vomiting. She does have a history of short gut syndrome. X-ray shows paucity of small bowel gas without evidence of obstructed loops.,  No free air,  She was started on aggressive IV fluid resuscitation. Continued on sodium  bicarbonate. Avoid nephrotoxic medication, Creatinine has improved . 5.06>4.40>2.92 AKI resolved.  Metabolic acidosis resolved.  Serum creatinine at baseline.  Nausea and vomiting: > Improving Continue IV fluid resuscitation, Continue IV compazine Started on clear liquid diet, advanced to full liquid.  High output ileostomy/short gut syndrome: Continue Imodium. Continue Questran She continues to have increased output from ileostomy,  GI is consulted.  Patient underwent ileoscopy which was unremarkable. Patient is scheduled for CT enterography today.  Chronic leucocytosis: > Resolved. Denies any fever, no signs of any infection. Hold on antibiotics at this time.  GERD: Continue pantoprazole 40 mg daily  Essential hypertension: Hold blood pressure medications and blood pressure is on soft side. Resume home blood pressure medications.  DVT prophylaxis: SCDs Code Status: Full code Family Communication: No family at bed side Disposition Plan:   Status is: Inpatient Remains inpatient appropriate because: Admitted for nausea, vomiting and high output from ileostomy leading to AKI on CKD stage IV.  Patient needs aggressive IV fluid resuscitation.  Patient still has high output ileostomy.  GI is consulted for further recommendation.  Underwent ileoscopy but now is scheduled for CT enterography tomorrow.  Consultants:  Nephrology  Procedures: None Antimicrobials: None  Subjective: Patient was seen and examined at bedside.Overnight events noted. Patient reports doing much better.  Denies any nausea and vomiting,  She underwent ileoscopy which was fine. Renal functions has improved.  She tolerated full liquid diet.  Advanced to soft diet. She is scheduled for CT enterography today.  Objective: Vitals:   07/20/22 2208 07/21/22 0148 07/21/22 0615 07/21/22 1332  BP: Marland Kitchen)  143/85 (!) 179/93 134/85 133/82  Pulse: (!) 105 86 82 89  Resp: 16 19 18 17   Temp: 98.1 F (36.7 C) 98.2 F  (36.8 C) 97.9 F (36.6 C) 98.1 F (36.7 C)  TempSrc: Oral Oral Oral Oral  SpO2: 94% 97% 97% 100%  Weight:      Height:        Intake/Output Summary (Last 24 hours) at 07/21/2022 1425 Last data filed at 07/21/2022 1005 Gross per 24 hour  Intake 3279.92 ml  Output 3700 ml  Net -420.08 ml   Filed Weights   07/16/22 1125 07/20/22 1230  Weight: 81.4 kg 81.4 kg    Examination:  General exam: Appears comfortable, not in any acute distress, deconditioned. Respiratory system: CTA bilaterally, respiratory effort normal, RR 15 Cardiovascular system: S1 & S2 heard, regular rate and rhythm, no murmur. Gastrointestinal system: Soft, non tender, non distended, BS+, ileostomy+ Central nervous system: Alert and oriented x 3, no focal neurological deficits. Extremities: No edema, no cyanosis, no clubbing. Skin: No rashes, lesions or ulcers Psychiatry: Judgement and insight appear normal. Mood & affect appropriate.     Data Reviewed: I have personally reviewed following labs and imaging studies  CBC: Recent Labs  Lab 07/15/22 1235 07/16/22 1158 07/17/22 0446 07/19/22 0437 07/21/22 0419  WBC 12.2* 16.8* 19.4* 10.9* 9.9  NEUTROABS 6.9 12.5*  --   --   --   HGB 10.7* 12.3 10.3* 9.5* 9.2*  HCT 32.8* 38.7 31.6* 29.5* 29.2*  MCV 103.7* 106.3* 104.3* 105.4* 106.2*  PLT 348.0 362 302 317 287   Basic Metabolic Panel: Recent Labs  Lab 07/15/22 1235 07/16/22 1158 07/17/22 0446 07/19/22 0437 07/21/22 0419  NA 136 136 135 140  --   K 3.9 4.7 3.6 3.5  --   CL 97 102 103 102  --   CO2 25 18* 21* 26  --   GLUCOSE 133* 122* 101* 100*  --   BUN 39* 56* 47* 30*  --   CREATININE 4.27* 5.06* 4.40* 2.92* 2.76*  CALCIUM 7.7* 8.6* 8.1* 8.4*  --    GFR: Estimated Creatinine Clearance: 19.7 mL/min (A) (by C-G formula based on SCr of 2.76 mg/dL (H)). Liver Function Tests: Recent Labs  Lab 07/15/22 1235 07/16/22 1158 07/17/22 0446  AST 37 37 27  ALT 40* 46* 36  ALKPHOS 91 90 79   BILITOT 0.4 0.7 1.0  PROT 7.4 9.2* 7.8  ALBUMIN 4.1 4.2 3.6   No results for input(s): "LIPASE", "AMYLASE" in the last 168 hours. No results for input(s): "AMMONIA" in the last 168 hours. Coagulation Profile: No results for input(s): "INR", "PROTIME" in the last 168 hours. Cardiac Enzymes: No results for input(s): "CKTOTAL", "CKMB", "CKMBINDEX", "TROPONINI" in the last 168 hours. BNP (last 3 results) No results for input(s): "PROBNP" in the last 8760 hours. HbA1C: No results for input(s): "HGBA1C" in the last 72 hours. CBG: No results for input(s): "GLUCAP" in the last 168 hours. Lipid Profile: No results for input(s): "CHOL", "HDL", "LDLCALC", "TRIG", "CHOLHDL", "LDLDIRECT" in the last 72 hours. Thyroid Function Tests: No results for input(s): "TSH", "T4TOTAL", "FREET4", "T3FREE", "THYROIDAB" in the last 72 hours. Anemia Panel: No results for input(s): "VITAMINB12", "FOLATE", "FERRITIN", "TIBC", "IRON", "RETICCTPCT" in the last 72 hours.  Sepsis Labs: No results for input(s): "PROCALCITON", "LATICACIDVEN" in the last 168 hours.  Recent Results (from the past 240 hour(s))  Calprotectin, Fecal     Status: None   Collection Time: 07/19/22  2:47 PM  Specimen: Stool  Result Value Ref Range Status   Calprotectin, Fecal 5 0 - 120 ug/g Final    Comment: (NOTE) Concentration     Interpretation   Follow-Up < 5 - 50 ug/g     Normal           None >50 -120 ug/g     Borderline       Re-evaluate in 4-6 weeks    >120 ug/g     Abnormal         Repeat as clinically                                   indicated Performed At: [facility] [place], [place] [id number] Rush Farmer MD SW:[id number]      Radiology Studies: DG SMALL BOWEL W DOUBLE CM (HD)  Result Date: 07/21/2022 CLINICAL DATA:  Crohn disease, prior colectomy and partial ileal resection. Now with increased output through ostomy. Looking for progression of Crohn disease, unable to have CT due to  renal function. 1 day s/p ileoscopy with biopsy. EXAM: SMALL BOWEL SERIES COMPARISON:  CT 06/29/22. TECHNIQUE: Following ingestion of barium, serial small bowel images were obtained including spot views of the neo-terminal ileum. FLUOROSCOPY: Radiation Exposure Index (if provided by the fluoroscopic device): 4.60 mGy Ref. Air Kerma FINDINGS: Scout image without obstructive bowel gas pattern. Surgical clips in the right upper quadrant. Dextroconvex rotatory scoliosis. Visualized lung bases are grossly clear. Normal small bowel luminal size and fold pattern. No apparent mucosal abnormality. No evidence for stricture or obstruction. Contrast traverses stomach to ileostomy within 95 minutes. IMPRESSION: Right mid abdominal ileostomy. No findings to indicate disease progression. Read and performed by: Alexandria Lodge, PA-C Supervised and interpreted by: Lorin Picket, MD Electronically Signed   By: Lorin Picket M.D.   On: 07/21/2022 14:10    Scheduled Meds:  amLODipine  5 mg Oral Daily   cholestyramine light  4 g Oral TID PC   DULoxetine  120 mg Oral Daily   folic acid  1 mg Oral Daily   loperamide  4 mg Oral QHS   metoprolol succinate  50 mg Oral BID   sodium bicarbonate  650 mg Oral TID   Continuous Infusions:  sodium chloride 500 mL (07/20/22 1234)   lactated ringers 125 mL/hr at 07/21/22 0822     LOS: 5 days    Time spent: 35 mins    Roni Friberg, MD [facility]   If 7PM-7AM, please contact night-coverage

## 2022-07-21 NOTE — Anesthesia Postprocedure Evaluation (Signed)
Anesthesia Post Note  Patient: [name]  Procedure(s) Performed: ILEOSCOPY THROUGH STOMA BIOPSY     Patient location during evaluation: Endoscopy Anesthesia Type: MAC Level of consciousness: awake and alert Pain management: pain level controlled Vital Signs Assessment: post-procedure vital signs reviewed and stable Respiratory status: spontaneous breathing, nonlabored ventilation and respiratory function stable Cardiovascular status: stable and blood pressure returned to baseline Postop Assessment: no apparent nausea or vomiting Anesthetic complications: no   No notable events documented.  Last Vitals:  Vitals:   07/21/22 0148 07/21/22 0615  BP: (!) 179/93 134/85  Pulse: 86 82  Resp: 19 18  Temp: 36.8 C 36.6 C  SpO2: 97% 97%    Last Pain:  Vitals:   07/21/22 0813  TempSrc:   PainSc: 0-No pain                 Mena Simonis

## 2022-07-21 NOTE — Plan of Care (Signed)
  Problem: Coping: Goal: Level of anxiety will decrease Outcome: Progressing   Problem: Pain Managment: Goal: General experience of comfort will improve Outcome: Progressing   

## 2022-07-21 NOTE — Consult Note (Signed)
   [facility] Inpatient Consult   07/21/2022  [name] [birth year] [id number]   [facility] [[facility]O] Patient: [name] Liaison remote review for patient at [facility]  Primary Care Provider:  Carollee Herter, Alferd Apa, DO with [place] at [facility] which is listed to provide the transition of care follow up.   Patient recently referred to [facility] for chronic disease care coordination community services.  Patient has been contacted however readmission in less than 7 days for follow up with note .    Plan: Continue to follow patient for progress and needs and update [facility] CC team for ongoing readmission prevention with high risk readmission risk score.   Of note, [name] Care Management services does not replace or interfere with any services that are needed or arranged by inpatient [name] care management team.   For additional questions or referrals please contact:  Natividad Brood, RN BSN [place]  [phone] business mobile phone Toll free office [phone]  *[place]  [phone] Fax number: [phone] [email or web address]      .

## 2022-07-21 NOTE — Progress Notes (Signed)
Patient ID: [name], female   DOB: 1956-04-20, 66 y.o.   MRN: [id number]    Progress Note   Subjective   Day # 6  CC; history of Crohn's disease, status post remote total colectomy, and ileostomy and pouch excision January 2023 Now with recurrent episodes of nausea vomiting and high ileostomy output  Ileoscopy yesterday -negative  Unable to do CT enterography due to chronic kidney disease  SBFT  today - normal  Labs today-WBC 9.9/hemoglobin 9.2/hematocrit 29.2 Fecal calprotectin was normal at 5 Sed rate 56 Creatinine 2.76-about her baseline   Pt feels well today she has not had any further episodes of the nausea vomiting and high volume ileostomy output since admission, feels fine between these episodes. She feels comfortable being discharged today    Objective   Vital signs in last 24 hours: Temp:  [97.8 F (36.6 C)-98.2 F (36.8 C)] 98.1 F (36.7 C) (11/15 1332) Pulse Rate:  [82-105] 89 (11/15 1332) Resp:  [13-19] 17 (11/15 1332) BP: (133-179)/(82-98) 133/82 (11/15 1332) SpO2:  [94 %-100 %] 100 % (11/15 1332) Last BM Date : 07/21/22 General: Older white female in NAD Heart:  Regular rate and rhythm; no murmurs Lungs: Respirations even and unlabored, lungs CTA bilaterally Abdomen:  Soft, nontender and nondistended. Normal bowel sounds.  Ileostomy right lower quadrant Extremities:  Without edema. Neurologic:  Alert and oriented,  grossly normal neurologically. Psych:  Cooperative. Normal mood and affect.  Intake/Output from previous day: 11/14 0701 - 11/15 0700 In: 3019 [P.O.:600; I.V.:2419] Out: 4600 [Urine:3200; XQJJH:4174] Intake/Output this shift: Total I/O In: 461 [I.V.:461] Out: 600 [Urine:300; Stool:300]  Lab Results: Recent Labs    07/19/22 0437 07/21/22 0419  WBC 10.9* 9.9  HGB 9.5* 9.2*  HCT 29.5* 29.2*  PLT 317 313   BMET Recent Labs    07/19/22 0437 07/21/22 0419  NA 140  --   K 3.5  --   CL 102  --   CO2 26  --   GLUCOSE  100*  --   BUN 30*  --   CREATININE 2.92* 2.76*  CALCIUM 8.4*  --    LFT No results for input(s): "PROT", "ALBUMIN", "AST", "ALT", "ALKPHOS", "BILITOT", "BILIDIR", "IBILI" in the last 72 hours. PT/INR No results for input(s): "LABPROT", "INR" in the last 72 hours.    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 Drue Flirt  Had done well postoperatively and was not having chronic high ileostomy output, generally taking 2 Imodium at bedtime each day.  Now with 3 distinct episodes over the past few months with recurrent episodes of nausea vomiting followed by 3 to 4 hours of high ileostomy output but no associated abdominal pain or distention.  After these episodes she develops significant weakness, had 1 episode with syncope, and then has acute kidney injury Is currently her third admission for same.  She has not had any further episodes since admission, Ileoscopy yesterday was negative and small bowel follow-through today negative Fecal calprotectin normal  She still may be having intermittent episodes of low-grade obstruction likely secondary to adhesive disease which resolves once she has the nausea vomiting and diarrhea  #2 chronic kidney disease stage IV with baseline  creatinine between 2 and 3-cute kidney injury superimposed this admission now resolved  #3 recurrent iron deficiency anemia felt secondary to IBD though no evidence of active IBD over the past year does get periodic iron infusions through hematology   Plan; patient will be discharged home this afternoon Has follow-up appointment with Dr. Havery Moros on Monday, 07/26/2022  can  consider capsule endoscopy as outpatient and also consider testing for SIBO  She is advised to continue to push fluids at home on a chronic basis drinks usually at least 80 ounces of water every day  Continue 2 imodium at bedtime Soft low residue diet  We are also trying to get her arranged for outpatient IV fluids 2-3 times weekly through the [facility] infusion center.             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

## 2022-07-21 NOTE — Discharge Instructions (Signed)
Advised to follow-up with primary care physician in 1 week. Advised to follow-up with Dr. Havery Moros on 07/26/2022. She will need capsule endoscopy as an outpatient.

## 2022-07-22 ENCOUNTER — Telehealth: Payer: Self-pay

## 2022-07-22 NOTE — Telephone Encounter (Signed)
Transition Care Management Follow-up Telephone Call Date of discharge and from where:[facility] 07-21-22 Dx: AKI  How have you been since you were released from the hospital? Doing better  Any questions or concerns? No  Items Reviewed: Did the pt receive and understand the discharge instructions provided? Yes  Medications obtained and verified? Yes  Other? No  Any new allergies since your discharge? No  Dietary orders reviewed? Yes Do you have support at home? Yes   Home Care and Equipment/Supplies: Were home health services ordered? no If so, what is the name of the agency? na  Has the agency set up a time to come to the patient's home? not applicable Were any new equipment or medical supplies ordered?  No What is the name of the medical supply agency? na Were you able to get the supplies/equipment? not applicable Do you have any questions related to the use of the equipment or supplies? No  Functional Questionnaire: (I = Independent and D = Dependent) ADLs: I  Bathing/Dressing- I  Meal Prep- I  Eating- I  Maintaining continence- I  Transferring/Ambulation- I  Managing Meds- I  Follow up appointments reviewed:  PCP Hospital f/u appt confirmed? Yes  Scheduled to see Dr Cheri Rous  on 08-03-22 @ [facility] f/u appt confirmed? Yes  Scheduled to see Dr Havery Moros  on 07-26-22 @ 9am. Are transportation arrangements needed? No  If their condition worsens, is the pt aware to call PCP or go to the Emergency Dept.? Yes Was the patient provided with contact information for the PCP's office or ED? Yes Was to pt encouraged to call back with questions or concerns? Yes   Juanda Crumble LPN [place] Direct Dial [phone]

## 2022-07-23 ENCOUNTER — Ambulatory Visit (INDEPENDENT_AMBULATORY_CARE_PROVIDER_SITE_OTHER): Payer: Medicare Other

## 2022-07-23 ENCOUNTER — Encounter (HOSPITAL_COMMUNITY): Payer: Self-pay | Admitting: Gastroenterology

## 2022-07-23 VITALS — BP 120/80 | HR 95 | Temp 98.1°F | Resp 20 | Ht 63.0 in | Wt 178.6 lb

## 2022-07-23 DIAGNOSIS — E860 Dehydration: Secondary | ICD-10-CM | POA: Diagnosis not present

## 2022-07-23 MED ORDER — SODIUM CHLORIDE 0.9 % IV BOLUS
1000.0000 mL | Freq: Once | INTRAVENOUS | Status: AC
  Administered 2022-07-23: 1000 mL via INTRAVENOUS
  Filled 2022-07-23: qty 1000

## 2022-07-23 NOTE — Patient Instructions (Signed)
[id number]  

## 2022-07-23 NOTE — Progress Notes (Signed)
Diagnosis: Dehydration  Provider:  Marshell Garfinkel MD  Procedure: Infusion  IV Type: Peripheral, IV Location: R Forearm  Normal Saline, Dose: 1000 ml  Infusion Start Time: 2608  Infusion Stop Time: 8835  Post Infusion IV Care: Peripheral IV Discontinued  Discharge: Condition: Good, Destination: Home . AVS provided to patient.   Performed by:  Koren Shiver, RN

## 2022-07-26 ENCOUNTER — Ambulatory Visit (INDEPENDENT_AMBULATORY_CARE_PROVIDER_SITE_OTHER): Payer: Medicare Other | Admitting: Gastroenterology

## 2022-07-26 ENCOUNTER — Encounter: Payer: Self-pay | Admitting: Gastroenterology

## 2022-07-26 ENCOUNTER — Ambulatory Visit: Payer: Medicare Other | Admitting: Physician Assistant

## 2022-07-26 ENCOUNTER — Ambulatory Visit (INDEPENDENT_AMBULATORY_CARE_PROVIDER_SITE_OTHER): Payer: Medicare Other

## 2022-07-26 VITALS — BP 102/70 | HR 89 | Ht 62.0 in | Wt 177.4 lb

## 2022-07-26 VITALS — BP 144/87 | HR 85 | Temp 97.7°F | Resp 18 | Ht 62.0 in | Wt 178.0 lb

## 2022-07-26 DIAGNOSIS — R198 Other specified symptoms and signs involving the digestive system and abdomen: Secondary | ICD-10-CM | POA: Diagnosis not present

## 2022-07-26 DIAGNOSIS — E860 Dehydration: Secondary | ICD-10-CM

## 2022-07-26 DIAGNOSIS — Z932 Ileostomy status: Secondary | ICD-10-CM | POA: Diagnosis not present

## 2022-07-26 DIAGNOSIS — K50819 Crohn's disease of both small and large intestine with unspecified complications: Secondary | ICD-10-CM

## 2022-07-26 MED ORDER — SODIUM CHLORIDE 0.9 % IV BOLUS
1000.0000 mL | Freq: Once | INTRAVENOUS | Status: AC
  Administered 2022-07-26: 1000 mL via INTRAVENOUS

## 2022-07-26 NOTE — Patient Instructions (Signed)
_______________________________________________________  If you are age 68 or older, your body mass index should be between 23-30. Your Body mass index is 32.44 kg/m. If this is out of the aforementioned range listed, please consider follow up with your Primary Care Provider.  If you are age 20 or younger, your body mass index should be between 19-25. Your Body mass index is 32.44 kg/m. If this is out of the aformentioned range listed, please consider follow up with your Primary Care Provider.   ________________________________________________________  The [facility] providers would like to encourage you to use [facility] to communicate with providers for non-urgent requests or questions.  Due to long hold times on the telephone, sending your provider a message by [facility] may be a faster and more efficient way to get a response.  Please allow 48 business hours for a response.  Please remember that this is for non-urgent requests.  _______________________________________________________  We will place orders for IV fluids at our infusion center and contact you to schedule.  Continue Imodium and cholestyramine.  Have orders for ostomy supplies sent to [place] and we will sign and return.  Thank you for entrusting me with your care and for choosing [facility], Dr. Lynnville Cellar

## 2022-07-26 NOTE — Progress Notes (Signed)
HPI :  IBD history: 66 year old white female with IBD ( Crohn's) since 68.  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 She was never able to be put in remission prior to colectomy. She was only exposed to steroids prior to her surgery. Post-operatively, she was on Remicade for 3-4 years but did not help too much. She had been on 6MP remotely but did not work for her. She was on Humira but also did not put her into remission and she developed high antibody titers and it was stopped. Most recently on Entyvio and low dose 6MP. 6MP was stopped due to worsening anemia.  She reports multiple courses of antibiotics with some benefit historically.  She has been on budesonider with ? Relief. Father and daughter have UC.  She has seen Jeanne Ivan and Dr. 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 Most recently saw Dr. Renelda Mom at [facility] and had diverting ileostomy April 23 2021. Returned for J pouch excision Jan 2023.   SINCE THE LAST VISIT   66 y/o female here for a follow up visit.  I last saw her in May she was in stable condition at the time and had continue to monitor.  Unfortunately she has not been doing too well since have last seen her.  She has been admitted to the hospital a few times over the past month or so for increased ostomy output associated with episodes of nausea and vomiting.  She states she will feel significant belching which Recombinate into vomiting a few times, and then she will have significant output from her ostomy over a few hours.  Unfortunately his episodes dehydrate her significantly, she has had acute on chronic kidney injury.  She has been followed closely by nephrology, hoping to avoid dialysis and maintain kidney function.  Most recently she was hospitalized at [facility] last week with another episode.  She underwent an  ileoscopy which was normal and showed no evidence of active Crohn's disease.  She had a small bowel follow-through which was also normal.  She had a recent CT scan in October showing no evidence of active Crohn's disease.  CRP has been normal, ESR most recently was 29.  Further, she had a fecal calprotectin of 5 this past week.  There is concern for possibly having obstructive episodes in the setting of surgical history.  She has been doing okay since she is been out of the hospital.  She was started on cholestyramine which has helped to bulk her stools, she is using it 3 times a day and does think it is helping with less output.  She is also been using 2 Imodium daily.  She has received IV fluids a few times from the [facility] infusion center since her discharge.  She is going for IV fluids today.  She states in discussion with her nephrologist she is hoping to get 1 L of IV fluids infused 3 days/week, Monday Wednesday Friday, until she is more stable in hopes of maintaining her renal function.  She is closely followed by nephrology with follow-up labs.  She otherwise is requesting to change the company from which she is ordering her ostomy supplies given they no longer carry the supplies she wishes to use.  She has a hard time using the alternative colostomy bag and they are recommending because it leaks.  She cannot recall the name of the company who provides the bags that she wishes to purchase, she is looking into this.  We also made her aware of the ostomy clinic in town locally.    Prior workup: Pouchoscopy 03/31/17 - anal fissure, mild anal canal stenosis, focal ulceration of pouch and more proximal ileum but improved compared to previous   Pouchoscopy 01/21/20 -  Preparation of the colon was poor. - A suspected small perianal fistulous tract and anal canal stenosis found on digital rectal exam. - The ileal pouch has an angulated turn with very focal ulcerations, otherwise no inflammatory changes  noted. biopsies obtained - Small area of nodularity along anal canal - biopsied to rule out AIN / condyloma - Suspected fistal of the distal pouch just proximal to anal canal Overall, only one focal ulceration noted and no other inflammatory changes seen. Incidentally noted suspected new fistula   EGD 01/21/20 -  - A 3 cm hiatal hernia was present. - LA Grade A esophagitis was found 33 cm from the incisors - mild. - The exam of the esophagus was otherwise normal. No Barrett's esophagus. - Multiple small sessile polyps were found in the gastric fundus and in the gastric body, benign appearing, likely fundic gland polyps, a cluster was noted at the distal end of the hernia sac. Biopsies were taken with a cold forceps for histology. - The exam of the stomach was otherwise normal. - The duodenal bulb and second portion of the duodenum were normal.   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 Cellulitis extends superiorly throughout the posterior subcutaneous fat, adjacent to the gluteal and posterior paraspinal muscles. No evidence of abscess.     MRE 06/25/20 - IMPRESSION: 1. Persistent inflammatory changes near the upper gluteal cleft bilaterally between the gluteus maximus muscles. No discrete drainable soft tissue abscess or intramuscular involvement. 2. No MR findings for acute inflammatory small-bowel disease. However, study is limited without contrast.   CT scan abdomen/ pelvis without contrast 05/01/21: IMPRESSION: 1. Findings worrisome for an area of focal bowel perforation versus anastomosis leak within the anterior aspect of the mid abdomen, with extravasation of air and contrast into the adjacent surgical incision site. 2. Postoperative changes within subsequent right lower quadrant ileostomy. 3. Aortic  atherosclerosis.   CT scan abdomen / pelvis without contrast 05/14/21: IMPRESSION: Status post proctocolectomy. Ileostomy is noted in right lower quadrant. There is no evidence of bowel obstruction seen at this time.   No definite acute abnormality is seen in the abdomen or pelvis at this time.     CT abdomen / pelvis without contrast 09/22/21: IMPRESSION: 1. Patient is status post colectomy with right lower quadrant ileostomy. Small bowel loops in the upper abdomen and stomach appeared dilated concerning for small bowel obstruction. Transition point is not definitive, but may be at the level of the ostomy. No free air or pneumatosis. 2. There is wall thickening of the distal esophagus and proximal stomach worrisome for gastritis and esophagitis. 3. Small amount of air in the pelvis is unchanged from prior, likely postoperative. 4. Nonobstructing left renal calculus    CT abdomen / pelvis without contrast 06/29/22: IMPRESSION: Status post colectomy with ileostomy seen in right lower quadrant. No abnormal bowel dilatation or inflammation is noted. No acute abnormality seen in the abdomen or pelvis.   Ileoscopy 07/20/22: normal  Small bowel follow through 07/21/22: IMPRESSION: Right mid abdominal ileostomy. No findings to indicate disease progression.   ESR and CRP normal on 07/21/22  Past Medical History:  Diagnosis Date   Abdominal pain    Hx   Allergy    Anal stenosis    Anemia    Anxiety    Arthritis    Asthma    patient does not have inhaler   Blood in stool    Hx   Blood in urine    Hx   Blood transfusion without reported diagnosis    Cataract    CKD (chronic kidney disease) stage 3, GFR 30-59 ml/min (HCC)    Crohn's colitis, other complication (HCC)    De Quervain's tenosynovitis    Depression    Difficulty urinating    Hx   Easy bruising    Esophagitis    Fainting    History - resolved - due to dehydration   Fatigue    Hx   Fibroid    Hx    Gastric polyp    GERD (gastroesophageal reflux disease)    Hearing loss    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   Hemorrhoids, external    Hemorrhoids, internal    Herpes, genital    vaginal treated 07/05/12 and pt states is resolved   History of cervical dysplasia    History of small bowel obstruction    Hyperlipidemia    currently no meds   Hyperparathyroidism    Hypertension    Hypokalemia    Hx   IBD (inflammatory bowel disease)    initially colectomy for suspected UC, 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   Obesity    Ovarian cyst    Poor dental hygiene    Pulmonary nodule, right    right upper lobe   Rectal bleeding    Hx   Rectal pain    Hx   Renal insufficiency    CKD - stage 3   RLS (restless legs syndrome)    no meds   Tooth infection 11/2016   right low   Ulcerative colitis    Visual disturbance    wears glasses   Weakness generalized    Hx - patient denies generalized weakness   Wears dentures    upper only     Past Surgical History:  Procedure Laterality Date   ANAL DILATION     BIOPSY  07/20/2022   Procedure: BIOPSY;  Surgeon: Ladene Artist, MD;  Location: [facility];  Service: Gastroenterology;;   CERVICAL BIOPSY  W/ LOOP ELECTRODE EXCISION     CHOLECYSTECTOMY     COLONOSCOPY     [name]   fatty tumor removed from back     X 2   HEMORRHOID SURGERY     ILEOSCOPY N/A 07/20/2022   Procedure: ILEOSCOPY THROUGH STOMA;  Surgeon: Ladene Artist, MD;  Location: [facility];  Service: Gastroenterology;  Laterality: N/A;   ILEOSTOMY CLOSURE     RESTORATIVE PROCTOCOLECTOMY     with insertion of ileoanal J Pouch with loop ileostomy   SHOULDER ARTHROSCOPY WITH CAPSULORRHAPHY Left 06/14/2019   Procedure: LEFT SHOULDER ARTHRSCOPIC REPAIR OF BONY BANKART FRACTURE;  Surgeon: Tania Ade, MD;  Location: [facility];  Service: Orthopedics;  Laterality: Left;  REQUEST 90 MINUTE   SIGMOIDOSCOPY     TOTAL ABDOMINAL HYSTERECTOMY  1998    TAH/LSO   TUBAL LIGATION     UPPER GASTROINTESTINAL ENDOSCOPY  [name]   Family History  Problem Relation Age of Onset   Hypertension Mother    Heart disease Mother        s/p pci   Ulcerative colitis Father    Hypertension Father    Heart attack Father    Diabetes Sister    Cancer Sister        uterine   Pulmonary fibrosis Sister    Ulcerative colitis Daughter    Cancer Maternal Uncle        LUNG   Irritable bowel syndrome Other        grandchildren   Colon cancer Neg Hx    Esophageal cancer Neg Hx    Stomach cancer Neg Hx    Rectal cancer Neg Hx    Social History   Tobacco Use   Smoking status: Former    Packs/day: 1.00    Years: 4.00    Total pack years: 4.00    Types: Cigarettes    Quit date: 05/21/1979    Years since quitting: 43.2   Smokeless tobacco: Never  Vaping Use   Vaping Use: Never used  Substance Use Topics   Alcohol use: No    Alcohol/week: 0.0 standard drinks of alcohol   Drug use: No   Current Outpatient Medications  Medication Sig Dispense Refill   allopurinol (ZYLOPRIM) 300 MG tablet TAKE ONE TABLET BY MOUTH DAILY AT 9AM (Patient taking differently: Take 150 mg by mouth at bedtime.) 90 tablet 1   amLODipine (NORVASC) 5 MG tablet Take 5 mg by mouth daily.     Calcium Carbonate Antacid (TUMS EXTRA STRENGTH 750 PO) Take 2 tablets by mouth daily as needed (CHEW for heartburn).     cholestyramine light (PREVALITE) 4 g packet Take 1 packet (4 g total) by mouth 3 (three) times daily after meals. 42 packet 1   diphenhydrAMINE (BENADRYL ALLERGY) 25 MG tablet Take 25 mg by mouth at bedtime.     DULoxetine (CYMBALTA) 60 MG capsule TAKE TWO CAPSULES (120MG) BY MOUTH DAILY AT 9AM (Patient taking differently: Take 120 mg by mouth daily.) 384 capsule 1   folic acid (FOLVITE) 1 MG tablet Take 2 tablets (2 mg total) by mouth daily. (Patient taking differently: Take 1 mg by mouth daily.) 180 tablet 3   GAS-X EXTRA STRENGTH 125 MG CAPS Take 125 mg by mouth at  bedtime.     metoprolol succinate (TOPROL-XL) 100 MG 24 hr tablet Take 50 mg by mouth See admin instructions. Take 50 mg by mouth in the morning and afternoon     OVER THE COUNTER MEDICATION Take 1 tablet by mouth See admin instructions. Nervive Nerve Health tablet- Take 1 tablet by mouth at bedtime     Vitamin D, Ergocalciferol, (DRISDOL) 1.25 MG (50000 UNIT) CAPS capsule Take 50,000 Units by mouth every 30 (thirty) days.     acetaminophen (TYLENOL) 500 MG tablet Take 1,000 mg by mouth at bedtime as needed (for pain/sleep). (Patient not taking: Reported on 07/22/2022)     loperamide (IMODIUM A-D) 2 MG tablet Take 4 mg by mouth at bedtime. (Patient not taking: Reported on 07/22/2022)     No current facility-administered medications for this visit.   Allergies  Allergen Reactions   Sulfa Antibiotics Hives, Itching, Rash and Swelling   Morphine Other (See Comments)    "Gives me crazy dreams" (delusions, also)   Sulfonamide Derivatives Hives, Itching and Swelling     Review of Systems: All systems reviewed and negative except where noted  in HPI.    DG SMALL BOWEL W DOUBLE CM (HD)  Result Date: 07/21/2022 CLINICAL DATA:  Crohn disease, prior colectomy and partial ileal resection. Now with increased output through ostomy. Looking for progression of Crohn disease, unable to have CT due to renal function. 1 day s/p ileoscopy with biopsy. EXAM: SMALL BOWEL SERIES COMPARISON:  CT 06/29/22. TECHNIQUE: Following ingestion of barium, serial small bowel images were obtained including spot views of the neo-terminal ileum. FLUOROSCOPY: Radiation Exposure Index (if provided by the fluoroscopic device): 4.60 mGy Ref. Air Kerma FINDINGS: Scout image without obstructive bowel gas pattern. Surgical clips in the right upper quadrant. Dextroconvex rotatory scoliosis. Visualized lung bases are grossly clear. Normal small bowel luminal size and fold pattern. No apparent mucosal abnormality. No evidence for  stricture or obstruction. Contrast traverses stomach to ileostomy within 95 minutes. IMPRESSION: Right mid abdominal ileostomy. No findings to indicate disease progression. Read and performed by: Alexandria Lodge, PA-C Supervised and interpreted by: Lorin Picket, MD Electronically Signed   By: Lorin Picket M.D.   On: 07/21/2022 14:10   DG Abd 2 Views  Result Date: 07/16/2022 CLINICAL DATA:  Vomiting EXAM: ABDOMEN - 2 VIEW COMPARISON:  None Available. FINDINGS: General paucity of small bowel gas without evidence of distended or obstructed loops. Scattered gas in the distal colon to the rectum. No free air. No radio-opaque calculi or other significant radiographic abnormality is seen. IMPRESSION: General paucity of small bowel gas without evidence of distended or obstructed loops. Scattered gas in the distal colon to the rectum. No free air. Electronically Signed   By: Delanna Ahmadi M.D.   On: 07/16/2022 13:26   CT ABDOMEN PELVIS WO CONTRAST  Result Date: 06/29/2022 CLINICAL DATA:  Acute left upper quadrant abdominal pain. EXAM: CT ABDOMEN AND PELVIS WITHOUT CONTRAST TECHNIQUE: Multidetector CT imaging of the abdomen and pelvis was performed following the standard protocol without IV contrast. RADIATION DOSE REDUCTION: This exam was performed according to the departmental dose-optimization program which includes automated exposure control, adjustment of the mA and/or kV according to patient size and/or use of iterative reconstruction technique. COMPARISON:  September 22, 2021. FINDINGS: Lower chest: No acute abnormality. Hepatobiliary: No focal liver abnormality is seen. Status post cholecystectomy. No biliary dilatation. Pancreas: Unremarkable. No pancreatic ductal dilatation or surrounding inflammatory changes. Spleen: Normal in size without focal abnormality. Adrenals/Urinary Tract: Adrenal glands are unremarkable. Kidneys are normal, without renal calculi, focal lesion, or hydronephrosis. Bladder is  unremarkable. Stomach/Bowel: The stomach appears normal. Status post colectomy with ileostomy seen in right lower quadrant. No abnormal bowel dilatation or inflammation is noted. Vascular/Lymphatic: No significant vascular findings are present. No enlarged abdominal or pelvic lymph nodes. Reproductive: Status post hysterectomy. No adnexal masses. Other: No significant hernia or ascites is noted. Musculoskeletal: No acute or significant osseous findings. IMPRESSION: Status post colectomy with ileostomy seen in right lower quadrant. No abnormal bowel dilatation or inflammation is noted. No acute abnormality seen in the abdomen or pelvis. Electronically Signed   By: Marijo Conception M.D.   On: 06/29/2022 12:24   DG Chest Portable 1 View  Result Date: 06/29/2022 CLINICAL DATA:  Hypotension. Chronic left upper quadrant abdominal pain. EXAM: PORTABLE CHEST 1 VIEW COMPARISON:  Chest x-ray dated September 22, 2021. FINDINGS: The heart size and mediastinal contours are within normal limits. Both lungs are clear. The visualized skeletal structures are unremarkable. IMPRESSION: No active disease. Electronically Signed   By: Titus Dubin M.D.   On: 06/29/2022 11:09  Physical Exam: BP 102/70   Pulse 89   Ht 5' 2" (1.575 m)   Wt 177 lb 6 oz (80.5 kg)   BMI 32.44 kg/m  Constitutional: Pleasant,well-developed, female in no acute distress. Abdominal: ileostomy in placed Soft, nondistended, nontender.  Neurological: Alert and oriented to person place and time. Skin: Skin is warm and dry. No rashes noted. Psychiatric: Normal mood and affect. Behavior is normal.   ASSESSMENT: 66 y.o. female here for assessment of the following  1. Crohn's disease of small and large intestines with complication ([place])   2. High output ileostomy ([place])   3. Ileostomy in place [facility])    History of Crohn's disease status post colectomy, with J-pouch excision, now with permanent ileostomy as she was having significant incontinence  previously.  Has recovered from her operation in regards to her abdominal wound etc. however more recently she has had intermittent episodes of nausea vomiting which are then followed by high ostomy output.  It does not appear that her Crohn's disease is active given normal fecal calprotectin, CT scan abdomen pelvis without active inflammation, ileoscopy normal, small bowel follow-through okay.  Possible she is having intermittent obstructive symptoms causing the vomiting however she does not have any pain with this and would be unusual to be associated with high ostomy output.  On cholestyramine and Imodium and her bowel function appears better, although she is very prone to dehydration with her kidney disease.  At the request of her nephrologist and the patient, we will place orders for IV fluids, give 1 L of normal saline 3 days weekly (Monday Wednesday Friday) over the next few weeks until we are certain that her bowel function is improved on her regimen.  If she is not getting better on this regimen or these episodes continue may consider other options such as octreotide.  Otherwise we will refer her to the ostomy nurse clinic and I am otherwise happy to order her ostomy supplies from which ever vendor she chooses, she needs to talk with her insurance company and will call me back regards to what supplies she needs and where she wants them from.  PLAN: - I will order her IVF at the infusion center 3 days per week for the next few weeks, renew as needed - continue immodium and cholestyramine as scheduled - Zofran PRN nausea - consider octreotide if high ostomy output persists - she wants to change company for ostomy supplies. She will call insurance company and we will order this - referral to ostomy nurse clinic - patient will follow up with nephrology clinic for close monitoring of her renal function  Jolly Mango, MD [facility]

## 2022-07-26 NOTE — Progress Notes (Signed)
Diagnosis: Dehydration  Provider:  Marshell Garfinkel MD  Procedure: Infusion  IV Type: Peripheral, IV Location: L Antecubital  Normal Saline, Dose: 1000 ml  Infusion Start Time: 1336  Infusion Stop Time: 1505  Post Infusion IV Care: Peripheral IV Discontinued  Discharge: Condition: Good, Destination: Home . AVS provided to patient.   Performed by:  Cleophus Molt, RN

## 2022-07-27 ENCOUNTER — Other Ambulatory Visit: Payer: Self-pay | Admitting: Pharmacy Technician

## 2022-07-28 ENCOUNTER — Ambulatory Visit (INDEPENDENT_AMBULATORY_CARE_PROVIDER_SITE_OTHER): Payer: Medicare Other

## 2022-07-28 VITALS — BP 139/83 | HR 85 | Temp 98.3°F | Resp 18 | Ht 62.0 in | Wt 178.4 lb

## 2022-07-28 DIAGNOSIS — E860 Dehydration: Secondary | ICD-10-CM

## 2022-07-28 MED ORDER — SODIUM CHLORIDE 0.9 % IV BOLUS
1000.0000 mL | Freq: Once | INTRAVENOUS | Status: AC
  Administered 2022-07-28: 1000 mL via INTRAVENOUS

## 2022-07-28 NOTE — Progress Notes (Signed)
Diagnosis: Dehydration  Provider:  Marshell Garfinkel MD  Procedure: Infusion  IV Type: Peripheral, IV Location: R Hand  Normal Saline, Dose: 1000 ml  Infusion Start Time: 1508  Infusion Stop Time: 4128  Post Infusion IV Care: Peripheral IV Discontinued  Discharge: Condition: Good, Destination: Home . AVS provided to patient.   Performed by:  Arnoldo Morale, RN

## 2022-08-02 ENCOUNTER — Ambulatory Visit (INDEPENDENT_AMBULATORY_CARE_PROVIDER_SITE_OTHER): Payer: Medicare Other

## 2022-08-02 VITALS — BP 149/105 | HR 105 | Temp 97.6°F | Resp 18 | Ht 62.0 in | Wt 175.0 lb

## 2022-08-02 DIAGNOSIS — E860 Dehydration: Secondary | ICD-10-CM | POA: Diagnosis not present

## 2022-08-02 MED ORDER — SODIUM CHLORIDE 0.9 % IV BOLUS
1000.0000 mL | Freq: Once | INTRAVENOUS | Status: AC
  Administered 2022-08-02: 1000 mL via INTRAVENOUS
  Filled 2022-08-02: qty 1000

## 2022-08-02 MED ORDER — SODIUM CHLORIDE 0.9 % IV BOLUS
1000.0000 mL | Freq: Once | INTRAVENOUS | Status: AC
  Administered 2022-08-02: 1000 mL via INTRAVENOUS

## 2022-08-02 NOTE — Progress Notes (Signed)
Diagnosis: Dehydration  Provider:  Marshell Garfinkel MD  Procedure: Infusion  IV Type: Peripheral, IV Location: R Forearm  Normal Saline, Dose: 2 liters  Infusion Start Time: 3202  Infusion Stop Time: 3343  Post Infusion IV Care: Peripheral IV Discontinued  Discharge: Condition: Good, Destination: Home . AVS provided to patient.   Performed by:  Koren Shiver, RN

## 2022-08-03 ENCOUNTER — Other Ambulatory Visit: Payer: Self-pay | Admitting: Family Medicine

## 2022-08-03 ENCOUNTER — Ambulatory Visit (INDEPENDENT_AMBULATORY_CARE_PROVIDER_SITE_OTHER): Payer: Medicare Other | Admitting: Family Medicine

## 2022-08-03 ENCOUNTER — Telehealth: Payer: Self-pay

## 2022-08-03 ENCOUNTER — Encounter: Payer: Self-pay | Admitting: Family Medicine

## 2022-08-03 VITALS — BP 138/86 | HR 92 | Temp 97.9°F | Resp 12 | Ht 63.0 in | Wt 177.0 lb

## 2022-08-03 DIAGNOSIS — E213 Hyperparathyroidism, unspecified: Secondary | ICD-10-CM | POA: Diagnosis not present

## 2022-08-03 DIAGNOSIS — K50819 Crohn's disease of both small and large intestine with unspecified complications: Secondary | ICD-10-CM | POA: Diagnosis not present

## 2022-08-03 DIAGNOSIS — D509 Iron deficiency anemia, unspecified: Secondary | ICD-10-CM

## 2022-08-03 DIAGNOSIS — R739 Hyperglycemia, unspecified: Secondary | ICD-10-CM

## 2022-08-03 DIAGNOSIS — R197 Diarrhea, unspecified: Secondary | ICD-10-CM | POA: Diagnosis not present

## 2022-08-03 DIAGNOSIS — D849 Immunodeficiency, unspecified: Secondary | ICD-10-CM

## 2022-08-03 DIAGNOSIS — E860 Dehydration: Secondary | ICD-10-CM | POA: Diagnosis not present

## 2022-08-03 DIAGNOSIS — E785 Hyperlipidemia, unspecified: Secondary | ICD-10-CM

## 2022-08-03 DIAGNOSIS — N184 Chronic kidney disease, stage 4 (severe): Secondary | ICD-10-CM

## 2022-08-03 DIAGNOSIS — F322 Major depressive disorder, single episode, severe without psychotic features: Secondary | ICD-10-CM

## 2022-08-03 LAB — COMPREHENSIVE METABOLIC PANEL
ALT: 35 U/L (ref 0–35)
AST: 23 U/L (ref 0–37)
Albumin: 4.3 g/dL (ref 3.5–5.2)
Alkaline Phosphatase: 109 U/L (ref 39–117)
BUN: 50 mg/dL — ABNORMAL HIGH (ref 6–23)
CO2: 17 mEq/L — ABNORMAL LOW (ref 19–32)
Calcium: 8.8 mg/dL (ref 8.4–10.5)
Chloride: 110 mEq/L (ref 96–112)
Creatinine, Ser: 3.18 mg/dL — ABNORMAL HIGH (ref 0.40–1.20)
GFR: 14.43 mL/min — CL (ref >60.00)
Glucose, Bld: 86 mg/dL (ref 70–99)
Potassium: 4.3 mEq/L (ref 3.5–5.1)
Sodium: 137 mEq/L (ref 135–145)
Total Bilirubin: 0.4 mg/dL (ref 0.2–1.2)
Total Protein: 7.6 g/dL (ref 6.0–8.3)

## 2022-08-03 LAB — LIPID PANEL
Cholesterol: 205 mg/dL — ABNORMAL HIGH (ref 0–200)
HDL: 52 mg/dL (ref >39.00)
NonHDL: 152.81
Total CHOL/HDL Ratio: 4
Triglycerides: 268 mg/dL — ABNORMAL HIGH (ref 0.0–149.0)
VLDL: 53.6 mg/dL — ABNORMAL HIGH (ref 0.0–40.0)

## 2022-08-03 LAB — CBC WITH DIFFERENTIAL/PLATELET
Basophils Absolute: 0.1 10*3/uL (ref 0.0–0.1)
Basophils Relative: 1 % (ref 0.0–3.0)
Eosinophils Absolute: 0.8 10*3/uL — ABNORMAL HIGH (ref 0.0–0.7)
Eosinophils Relative: 6.6 % — ABNORMAL HIGH (ref 0.0–5.0)
HCT: 33.3 % — ABNORMAL LOW (ref 36.0–46.0)
Hemoglobin: 10.8 g/dL — ABNORMAL LOW (ref 12.0–15.0)
Lymphocytes Relative: 18.2 % (ref 12.0–46.0)
Lymphs Abs: 2.3 10*3/uL (ref 0.7–4.0)
MCHC: 32.4 g/dL (ref 30.0–36.0)
MCV: 103.2 fl — ABNORMAL HIGH (ref 78.0–100.0)
Monocytes Absolute: 0.8 10*3/uL (ref 0.1–1.0)
Monocytes Relative: 6.8 % (ref 3.0–12.0)
Neutro Abs: 8.4 10*3/uL — ABNORMAL HIGH (ref 1.4–7.7)
Neutrophils Relative %: 67.4 % (ref 43.0–77.0)
Platelets: 396 10*3/uL (ref 150.0–400.0)
RBC: 3.23 Mil/uL — ABNORMAL LOW (ref 3.87–5.11)
RDW: 15.5 % (ref 11.5–15.5)
WBC: 12.5 10*3/uL — ABNORMAL HIGH (ref 4.0–10.5)

## 2022-08-03 LAB — IBC PANEL
Iron: 142 ug/dL (ref 42–145)
Saturation Ratios: 32.3 % (ref 20.0–50.0)
TIBC: 439.6 ug/dL (ref 250.0–450.0)
Transferrin: 314 mg/dL (ref 212.0–360.0)

## 2022-08-03 LAB — LDL CHOLESTEROL, DIRECT: Direct LDL: 109 mg/dL

## 2022-08-03 NOTE — Patient Instructions (Signed)
Dehydration, Adult Dehydration is a condition in which there is not enough water or other fluids in the body. This happens when a person loses more fluids than he or she takes in. Important organs, such as the kidneys, brain, and heart, cannot function without a proper amount of fluids. Any loss of fluids from the body can lead to dehydration. Dehydration can be mild, moderate, or severe. It should be treated right away to prevent it from becoming severe. What are the causes? Dehydration may be caused by: Conditions that cause loss of water or other fluids, such as diarrhea, vomiting, or sweating or urinating a lot. Not drinking enough fluids, especially when you are ill or doing activities that require a lot of energy. Other illnesses and conditions, such as fever or infection. Certain medicines, such as medicines that remove excess fluid from the body (diuretics). Lack of safe drinking water. Not being able to get enough water and food. What increases the risk? The following factors may make you more likely to develop this condition: Having a long-term (chronic) illness that has not been treated properly, such as diabetes, heart disease, or kidney disease. Being 68 years of age or older. Having a disability. Living in a place that is high in altitude, where thinner, drier air causes more fluid loss. Doing exercises that put stress on your body for a long time (endurance sports). What are the signs or symptoms? Symptoms of dehydration depend on how severe it is. Mild or moderate dehydration Thirst. Dry lips or dry mouth. Dizziness or light-headedness, especially when standing up from a seated position. Muscle cramps. Dark urine. Urine may be the color of tea. Less urine or tears produced than usual. Headache. Severe dehydration Changes in skin. Your skin may be cold and clammy, blotchy, or pale. Your skin also may not return to normal after being lightly pinched and released. Little or  no tears, urine, or sweat. Changes in vital signs, such as rapid breathing and low blood pressure. Your pulse may be weak or may be faster than 100 beats a minute when you are sitting still. Other changes, such as: Feeling very thirsty. Sunken eyes. Cold hands and feet. Confusion. Being very tired (lethargic) or having trouble waking from sleep. Short-term weight loss. Loss of consciousness. How is this diagnosed? This condition is diagnosed based on your symptoms and a physical exam. You may have blood and urine tests to help confirm the diagnosis. How is this treated? Treatment for this condition depends on how severe it is. Treatment should be started right away. Do not wait until dehydration becomes severe. Severe dehydration is an emergency and needs to be treated in a hospital. Mild or moderate dehydration can be treated at home. You may be asked to: Drink more fluids. Drink an oral rehydration solution (ORS). This drink helps restore proper amounts of fluids and salts and minerals in the blood (electrolytes). Severe dehydration can be treated: With IV fluids. By correcting abnormal levels of electrolytes. This is often done by giving electrolytes through a tube that is passed through your nose and into your stomach (nasogastric tube, or NG tube). By treating the underlying cause of dehydration. Follow these instructions at home: Oral rehydration solution If told by your health care provider, drink an ORS: Make an ORS by following instructions on the package. Start by drinking small amounts, about  cup (120 mL) every 5-10 minutes. Slowly increase how much you drink until you have taken the amount recommended by your health  care provider. Eating and drinking        Drink enough clear fluid to keep your urine pale yellow. If you were told to drink an ORS, finish the ORS first and then start slowly drinking other clear fluids. Drink fluids such as: Water. Do not drink only  water. Doing that can lead to hyponatremia, which is having too little salt (sodium) in the body. Water from ice chips you suck on. Fruit juice that you have added water to (diluted fruit juice). Low-calorie sports drinks. Eat foods that contain a healthy balance of electrolytes, such as bananas, oranges, potatoes, tomatoes, and spinach. Do not drink alcohol. Avoid the following: Drinks that contain a lot of sugar. These include high-calorie sports drinks, fruit juice that is not diluted, and soda. Caffeine. Foods that are greasy or contain a lot of fat or sugar. General instructions Take over-the-counter and prescription medicines only as told by your health care provider. Do not take sodium tablets. Doing that can lead to having too much sodium in the body (hypernatremia). Return to your normal activities as told by your health care provider. Ask your health care provider what activities are safe for you. Keep all follow-up visits as told by your health care provider. This is important. Contact a health care provider if: You have muscle cramps, pain, or discomfort, such as: Pain in your abdomen and the pain gets worse or stays in one area (localizes). Stiff neck. You have a rash. You are more irritable than usual. You are sleepier or have a harder time waking than usual. You feel weak or dizzy. You feel very thirsty. Get help right away if you have: Any symptoms of severe dehydration. Symptoms of vomiting, such as: You cannot eat or drink without vomiting. Vomiting gets worse or does not go away. Vomit includes blood or green matter (bile). Symptoms that get worse with treatment. A fever. A severe headache. Problems with urination or bowel movements, such as: Diarrhea that gets worse or does not go away. Blood in your stool (feces). This may cause stool to look black and tarry. Not urinating, or urinating only a small amount of very dark urine, within 6-8 hours. Trouble  breathing. These symptoms may represent a serious problem that is an emergency. Do not wait to see if the symptoms will go away. Get medical help right away. Call your local emergency services (911 in the [place] Do not drive yourself to the hospital. Summary Dehydration is a condition in which there is not enough water or other fluids in the body. This happens when a person loses more fluids than he or she takes in. Treatment for this condition depends on how severe it is. Treatment should be started right away. Do not wait until dehydration becomes severe. Drink enough clear fluid to keep your urine pale yellow. If you were told to drink an oral rehydration solution (ORS), finish the ORS first and then start slowly drinking other clear fluids. Take over-the-counter and prescription medicines only as told by your health care provider. Get help right away if you have any symptoms of severe dehydration. This information is not intended to replace advice given to you by your health care provider. Make sure you discuss any questions you have with your health care provider. Document Revised: 12/30/2021 Document Reviewed: 04/05/2019 Elsevier Patient Education  [place].

## 2022-08-03 NOTE — Progress Notes (Signed)
Subjective:   By signing my name below, I, [name], attest that this documentation has been prepared under the direction and in the presence of Ann Held, DO. 08/03/2022      Patient ID: [name], female    DOB: 01/17/1956, 66 y.o.   MRN: [id number]  Chief Complaint  Patient presents with   Hospitalization Follow-up    HPI Patient is in today for a hospital follow up visit.   She was admitted to the hospital on 07/16/2022 for AKI and dehydration. She was recommended to: Follow up with PCP in 1-2 weeks Please obtain BMP/CBC in one week Advised to follow-up with Dr. Havery Moros on 07/26/2022. She will need capsule endoscopy as an outpatient.  She received IV liquids prior to this visit and report doing well after receiving them.  She is UTD on flu vaccine this year.    Past Medical History:  Diagnosis Date   Abdominal pain    Hx   Allergy    Anal stenosis    Anemia    Anxiety    Arthritis    Asthma    patient does not have inhaler   Blood in stool    Hx   Blood in urine    Hx   Blood transfusion without reported diagnosis    Cataract    CKD (chronic kidney disease) stage 3, GFR 30-59 ml/min (HCC)    Crohn's colitis, other complication (HCC)    De Quervain's tenosynovitis    Depression    Difficulty urinating    Hx   Easy bruising    Esophagitis    Fainting    History - resolved - due to dehydration   Fatigue    Hx   Fibroid    Hx   Gastric polyp    GERD (gastroesophageal reflux disease)    Hearing loss    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   Hemorrhoids, external    Hemorrhoids, internal    Herpes, genital    vaginal treated 07/05/12 and pt states is resolved   History of cervical dysplasia    History of small bowel obstruction    Hyperlipidemia    currently no meds   Hyperparathyroidism    Hypertension    Hypokalemia    Hx   IBD (inflammatory bowel disease)    initially colectomy for suspected UC, now with Crohns of  the pouch versus chronic pouchitis   Obesity    Ovarian cyst    Poor dental hygiene    Pulmonary nodule, [name]    [name] upper lobe   Rectal bleeding    Hx   Rectal pain    Hx   Renal insufficiency    CKD - stage 3   RLS (restless legs syndrome)    no meds   Tooth infection 11/2016   [name] low   Ulcerative colitis    Visual disturbance    wears glasses   Weakness generalized    Hx - patient denies generalized weakness   Wears dentures    upper only    Past Surgical History:  Procedure Laterality Date   ANAL DILATION     BIOPSY  07/20/2022   Procedure: BIOPSY;  Surgeon: Ladene Artist, MD;  Location: [facility];  Service: Gastroenterology;;   CERVICAL BIOPSY  W/ LOOP ELECTRODE EXCISION     CHOLECYSTECTOMY     COLONOSCOPY     [name]   fatty tumor removed from  back     X 2   HEMORRHOID SURGERY     ILEOSCOPY N/A 07/20/2022   Procedure: ILEOSCOPY THROUGH STOMA;  Surgeon: Ladene Artist, MD;  Location: [facility];  Service: Gastroenterology;  Laterality: N/A;   ILEOSTOMY CLOSURE     RESTORATIVE PROCTOCOLECTOMY     with insertion of ileoanal J Pouch with loop ileostomy   SHOULDER ARTHROSCOPY WITH CAPSULORRHAPHY Left 06/14/2019   Procedure: LEFT SHOULDER ARTHRSCOPIC REPAIR OF BONY BANKART FRACTURE;  Surgeon: Tania Ade, MD;  Location: [facility];  Service: Orthopedics;  Laterality: Left;  REQUEST 14 MINUTE   SIGMOIDOSCOPY     TOTAL ABDOMINAL HYSTERECTOMY  1998   TAH/LSO   TUBAL LIGATION     UPPER GASTROINTESTINAL ENDOSCOPY     [name]    Family History  Problem Relation Age of Onset   Hypertension Mother    Heart disease Mother        s/p pci   Ulcerative colitis Father    Hypertension Father    Heart attack Father    Diabetes Sister    Cancer Sister        uterine   Pulmonary fibrosis Sister    Ulcerative colitis Daughter    Cancer Maternal Uncle        LUNG   Irritable bowel syndrome Other        grandchildren   Colon cancer Neg Hx    Esophageal  cancer Neg Hx    Stomach cancer Neg Hx    Rectal cancer Neg Hx     Social History   Socioeconomic History   Marital status: Divorced    Spouse name: Not on file   Number of children: Not on file   Years of education: Not on file   Highest education level: Not on file  Occupational History   Not on file  Tobacco Use   Smoking status: Former    Packs/day: 1.00    Years: 4.00    Total pack years: 4.00    Types: Cigarettes    Quit date: 05/21/1979    Years since quitting: 43.2   Smokeless tobacco: Never  Vaping Use   Vaping Use: Never used  Substance and Sexual Activity   Alcohol use: No    Alcohol/week: 0.0 standard drinks of alcohol   Drug use: No   Sexual activity: Yes    Birth control/protection: Post-menopausal    Comment: 1st intercourse 66 yo-Fewer than 5 partners  Other Topics Concern   Not on file  Social History Narrative   Not on file   Social Determinants of Health   Financial Resource Strain: Low Risk  (05/26/2022)   Overall Financial Resource Strain (CARDIA)    Difficulty of Paying Living Expenses: Not hard at all  Food Insecurity: No Food Insecurity (07/16/2022)   Hunger Vital Sign    Worried About Running Out of Food in the Last Year: Never true    Ran Out of Food in the Last Year: Never true  Transportation Needs: No Transportation Needs (07/16/2022)   PRAPARE - Hydrologist (Medical): No    Lack of Transportation (Non-Medical): No  Physical Activity: Inactive (05/26/2022)   Exercise Vital Sign    Days of Exercise per Week: 0 days    Minutes of Exercise per Session: 0 min  Stress: No Stress Concern Present (05/26/2022)   [place]    Feeling of Stress : Not at all  Social Connections: Moderately Isolated (05/26/2022)   Social Connection and Isolation Panel [NHANES]    Frequency of Communication with Friends and Family: More than three times a week     Frequency of Social Gatherings with Friends and Family: More than three times a week    Attends Religious Services: Never    Marine scientist or Organizations: No    Attends Archivist Meetings: Never    Marital Status: Married  Human resources officer Violence: Not At Risk (07/16/2022)   Humiliation, Afraid, Rape, and Kick questionnaire    Fear of Current or Ex-Partner: No    Emotionally Abused: No    Physically Abused: No    Sexually Abused: No    Outpatient Medications Prior to Visit  Medication Sig Dispense Refill   acetaminophen (TYLENOL) 500 MG tablet Take 1,000 mg by mouth at bedtime as needed (for pain/sleep).     allopurinol (ZYLOPRIM) 300 MG tablet TAKE ONE TABLET BY MOUTH DAILY AT 9AM (Patient taking differently: Take 150 mg by mouth at bedtime.) 90 tablet 1   amLODipine (NORVASC) 5 MG tablet Take 5 mg by mouth daily.     Calcium Carbonate Antacid (TUMS EXTRA STRENGTH 750 PO) Take 2 tablets by mouth daily as needed (CHEW for heartburn).     cholestyramine light (PREVALITE) 4 g packet Take 1 packet (4 g total) by mouth 3 (three) times daily after meals. 42 packet 1   diphenhydrAMINE (BENADRYL ALLERGY) 25 MG tablet Take 25 mg by mouth at bedtime.     DULoxetine (CYMBALTA) 60 MG capsule TAKE TWO CAPSULES (120MG) BY MOUTH DAILY AT 9AM (Patient taking differently: Take 120 mg by mouth daily.) 253 capsule 1   folic acid (FOLVITE) 1 MG tablet Take 2 tablets (2 mg total) by mouth daily. (Patient taking differently: Take 1 mg by mouth daily.) 180 tablet 3   GAS-X EXTRA STRENGTH 125 MG CAPS Take 125 mg by mouth at bedtime.     metoprolol succinate (TOPROL-XL) 100 MG 24 hr tablet Take 50 mg by mouth See admin instructions. Take 50 mg by mouth in the morning and afternoon     OVER THE COUNTER MEDICATION Take 1 tablet by mouth See admin instructions. Nervive Nerve Health tablet- Take 1 tablet by mouth at bedtime     Vitamin D, Ergocalciferol, (DRISDOL) 1.25 MG (50000 UNIT) CAPS  capsule Take 50,000 Units by mouth every 30 (thirty) days.     loperamide (IMODIUM A-D) 2 MG tablet Take 4 mg by mouth at bedtime. (Patient not taking: Reported on 07/22/2022)     No facility-administered medications prior to visit.    Allergies  Allergen Reactions   Sulfa Antibiotics Hives, Itching, Rash and Swelling   Morphine Other (See Comments)    "Gives me crazy dreams" (delusions, also)   Sulfonamide Derivatives Hives, Itching and Swelling    Review of Systems  Constitutional:  Negative for chills, fever and malaise/fatigue.  HENT:  Negative for congestion and hearing loss.   Eyes:  Negative for discharge.  Respiratory:  Negative for cough, sputum production and shortness of breath.   Cardiovascular:  Negative for chest pain, palpitations and leg swelling.  Gastrointestinal:  Negative for abdominal pain, blood in stool, constipation, diarrhea, heartburn, nausea and vomiting.  Genitourinary:  Negative for dysuria, frequency, hematuria and urgency.  Musculoskeletal:  Negative for back pain, falls and myalgias.  Skin:  Negative for rash.  Neurological:  Negative for dizziness, sensory change, loss of consciousness, weakness and headaches.  Endo/Heme/Allergies:  Negative for environmental allergies. Does not bruise/bleed easily.  Psychiatric/Behavioral:  Negative for depression and suicidal ideas. The patient is not nervous/anxious and does not have insomnia.        Objective:    Physical Exam Vitals and nursing note reviewed.  Constitutional:      General: She is not in acute distress.    Appearance: Normal appearance. She is not ill-appearing.  HENT:     Head: Normocephalic and atraumatic.     [name] Ear: External ear normal.     Left Ear: External ear normal.  Eyes:     Extraocular Movements: Extraocular movements intact.     Pupils: Pupils are equal, round, and reactive to light.  Cardiovascular:     Rate and Rhythm: Normal rate and regular rhythm.     Heart  sounds: Normal heart sounds. No murmur heard.    No gallop.  Pulmonary:     Effort: Pulmonary effort is normal. No respiratory distress.     Breath sounds: Normal breath sounds. No wheezing or rales.  Skin:    General: Skin is warm and dry.  Neurological:     Mental Status: She is alert and oriented to person, place, and time.  Psychiatric:        Judgment: Judgment normal.     BP 138/86 (BP Location: Left Arm, Cuff Size: Large)   Pulse 92   Temp 97.9 F (36.6 C) (Oral)   Resp 12   Ht 5' 3"  (1.6 m)   Wt 177 lb (80.3 kg)   SpO2 99%   BMI 31.35 kg/m  Wt Readings from Last 3 Encounters:  08/03/22 177 lb (80.3 kg)  08/02/22 175 lb (79.4 kg)  07/28/22 178 lb 6.4 oz (80.9 kg)    Diabetic Foot Exam - Simple   No data filed    Lab Results  Component Value Date   WBC 12.5 (H) 08/03/2022   HGB 10.8 (L) 08/03/2022   HCT 33.3 (L) 08/03/2022   PLT 396.0 08/03/2022   GLUCOSE 86 08/03/2022   CHOL 205 (H) 08/03/2022   TRIG 268.0 (H) 08/03/2022   HDL 52.00 08/03/2022   LDLDIRECT 109.0 08/03/2022   LDLCALC 116 (H) 02/26/2020   ALT 35 08/03/2022   AST 23 08/03/2022   NA 137 08/03/2022   K 4.3 08/03/2022   CL 110 08/03/2022   CREATININE 3.18 (H) 08/03/2022   BUN 50 (H) 08/03/2022   CO2 17 (L) 08/03/2022   TSH 3.75 12/08/2021   INR 1.0 05/26/2021   HGBA1C 5.6 08/23/2020    Lab Results  Component Value Date   TSH 3.75 12/08/2021   Lab Results  Component Value Date   WBC 12.5 (H) 08/03/2022   HGB 10.8 (L) 08/03/2022   HCT 33.3 (L) 08/03/2022   MCV 103.2 (H) 08/03/2022   PLT 396.0 08/03/2022   Lab Results  Component Value Date   NA 137 08/03/2022   K 4.3 08/03/2022   CO2 17 (L) 08/03/2022   GLUCOSE 86 08/03/2022   BUN 50 (H) 08/03/2022   CREATININE 3.18 (H) 08/03/2022   BILITOT 0.4 08/03/2022   ALKPHOS 109 08/03/2022   AST 23 08/03/2022   ALT 35 08/03/2022   PROT 7.6 08/03/2022   ALBUMIN 4.3 08/03/2022   CALCIUM 8.8 08/03/2022   ANIONGAP 12 07/19/2022    GFR 14.43 (LL) 08/03/2022   Lab Results  Component Value Date   CHOL 205 (H) 08/03/2022   Lab Results  Component Value Date  HDL 52.00 08/03/2022   Lab Results  Component Value Date   LDLCALC 116 (H) 02/26/2020   Lab Results  Component Value Date   TRIG 268.0 (H) 08/03/2022   Lab Results  Component Value Date   CHOLHDL 4 08/03/2022   Lab Results  Component Value Date   HGBA1C 5.6 08/23/2020       Assessment & Plan:   Problem List Items Addressed This Visit       Unprioritized   Morbid obesity ([place])   Immunologic deficiency syndrome ([place])   Hyperlipidemia - Primary   Relevant Orders   CBC with Differential/Platelet (Completed)   Comprehensive metabolic panel (Completed)   Lipid panel (Completed)   IBC panel (Completed)   Hyperglycemia   Relevant Orders   CBC with Differential/Platelet (Completed)   Comprehensive metabolic panel (Completed)   Lipid panel (Completed)   IBC panel (Completed)   Diarrhea with dehydration   Relevant Orders   CBC with Differential/Platelet (Completed)   Comprehensive metabolic panel (Completed)   Lipid panel (Completed)   IBC panel (Completed)   Depression, major, single episode, severe (HCC)   Dehydration    Con't ivf 2x a week per nephrology      Relevant Orders   CBC with Differential/Platelet (Completed)   Comprehensive metabolic panel (Completed)   Lipid panel (Completed)   IBC panel (Completed)   Crohn's disease of both small and large intestine ([place])    Per GI      Relevant Orders   CBC with Differential/Platelet (Completed)   Comprehensive metabolic panel (Completed)   Lipid panel (Completed)   IBC panel (Completed)   CKD (chronic kidney disease) stage 4, GFR 15-29 ml/min [facility])    Per nephrology Check labs today      [facility] DEFICIENCY    Recheck today Per hematology and nephrology      Relevant Orders   CBC with Differential/Platelet (Completed)   Comprehensive metabolic panel (Completed)    Lipid panel (Completed)   IBC panel (Completed)   Other Visit Diagnoses     Stage 4 chronic kidney disease ([place])       Relevant Orders   Comprehensive metabolic panel (Completed)   Parathyroid hormone excess (HCC)   (Chronic)          No orders of the defined types were placed in this encounter.   IAnn Held, DO, personally preformed the services described in this documentation.  All medical record entries made by the scribe were at my direction and in my presence.  I have reviewed the chart and discharge instructions (if applicable) and agree that the record reflects my personal performance and is accurate and complete. 08/03/2022   I,[name],acting as a scribe for Ann Held, DO.,have documented all relevant documentation on the behalf of Ann Held, DO,as directed by  Ann Held, DO while in the presence of Ann Held, DO.   Ann Held, DO

## 2022-08-03 NOTE — Assessment & Plan Note (Signed)
Per nephrology Check labs today

## 2022-08-03 NOTE — Assessment & Plan Note (Signed)
Per GI

## 2022-08-03 NOTE — Assessment & Plan Note (Signed)
Con't ivf 2x a week per nephrology

## 2022-08-03 NOTE — Telephone Encounter (Signed)
CRITICAL VALUE STICKER  CRITICAL VALUE: GFR 14.43  RECEIVER (on-site recipient of call):  DATE & TIME NOTIFIED: 08/03/22 4:42pm   MESSENGER (representative from lab):[email or web address]  MD NOTIFIED: Dr. Etter Sjogren  TIME OF NOTIFICATION: 4:46pm  RESPONSE:

## 2022-08-03 NOTE — Assessment & Plan Note (Signed)
Recheck today Per hematology and nephrology

## 2022-08-04 ENCOUNTER — Ambulatory Visit: Payer: Medicare Other

## 2022-08-04 ENCOUNTER — Telehealth: Payer: Self-pay

## 2022-08-04 MED ORDER — SODIUM CHLORIDE 0.9 % IV BOLUS
1000.0000 mL | Freq: Once | INTRAVENOUS | Status: DC
  Filled 2022-08-04: qty 1000

## 2022-08-04 NOTE — Patient Outreach (Signed)
  Care Coordination   08/04/2022 Name: [name] MRN: [id number] DOB: [birth year]   Care Coordination Outreach Attempts:  An unsuccessful telephone outreach was attempted today to offer the patient information about available care coordination services as a benefit of their health plan.   Follow Up Plan:  Additional outreach attempts will be made to offer the patient care coordination information and services.   Encounter Outcome:  No Answer   Care Coordination Interventions:  No, not indicated    Thea Silversmith, RN, MSN, BSN, [place] Coordinator [phone]

## 2022-08-04 NOTE — Telephone Encounter (Signed)
Labs faxed

## 2022-08-05 ENCOUNTER — Ambulatory Visit (INDEPENDENT_AMBULATORY_CARE_PROVIDER_SITE_OTHER): Payer: Medicare Other

## 2022-08-05 ENCOUNTER — Encounter: Payer: Self-pay | Admitting: Gastroenterology

## 2022-08-05 VITALS — BP 134/83 | HR 77 | Temp 97.7°F | Resp 16 | Ht 63.0 in | Wt 175.0 lb

## 2022-08-05 DIAGNOSIS — E860 Dehydration: Secondary | ICD-10-CM | POA: Diagnosis not present

## 2022-08-05 MED ORDER — SODIUM CHLORIDE 0.9 % IV BOLUS
1000.0000 mL | Freq: Once | INTRAVENOUS | Status: AC
  Administered 2022-08-05: 1000 mL via INTRAVENOUS
  Filled 2022-08-05: qty 1000

## 2022-08-05 NOTE — Progress Notes (Signed)
Diagnosis: Dehydration  Provider:  Marshell Garfinkel MD  Procedure: Infusion  IV Type: Peripheral, IV Location: L Antecubital  Normal Saline, Dose: 1000 ml  Infusion Start Time: 0818  Infusion Stop Time: 0935  Post Infusion IV Care: Peripheral IV Discontinued  Discharge: Condition: Good, Destination: Home . AVS provided to patient.   Performed by:  Cleophus Molt, RN

## 2022-08-06 ENCOUNTER — Ambulatory Visit (INDEPENDENT_AMBULATORY_CARE_PROVIDER_SITE_OTHER): Payer: Medicare Other

## 2022-08-06 VITALS — BP 134/81 | HR 90 | Temp 97.9°F | Resp 16 | Ht 63.0 in | Wt 175.6 lb

## 2022-08-06 DIAGNOSIS — E860 Dehydration: Secondary | ICD-10-CM | POA: Diagnosis not present

## 2022-08-06 MED ORDER — SODIUM CHLORIDE 0.9 % IV BOLUS
1000.0000 mL | Freq: Once | INTRAVENOUS | Status: AC
  Administered 2022-08-06: 1000 mL via INTRAVENOUS

## 2022-08-06 NOTE — Progress Notes (Signed)
Diagnosis: Dehydration  Provider:  Marshell Garfinkel MD  Procedure: Infusion  IV Type: Peripheral, IV Location: L Antecubital  Normal Saline, Dose: 1000 mg  Infusion Start Time: 0808  Infusion Stop Time: 0910  Post Infusion IV Care: Peripheral IV Discontinued  Discharge: Condition: Good, Destination: Home . AVS provided to patient.   Performed by:  Koren Shiver, RN

## 2022-08-09 ENCOUNTER — Ambulatory Visit (INDEPENDENT_AMBULATORY_CARE_PROVIDER_SITE_OTHER): Payer: Medicare Other

## 2022-08-09 ENCOUNTER — Telehealth: Payer: Self-pay | Admitting: *Deleted

## 2022-08-09 VITALS — BP 147/88 | HR 92 | Temp 98.0°F | Resp 18 | Ht 63.0 in | Wt 178.8 lb

## 2022-08-09 DIAGNOSIS — E860 Dehydration: Secondary | ICD-10-CM

## 2022-08-09 MED ORDER — SODIUM CHLORIDE 0.9 % IV BOLUS
1000.0000 mL | Freq: Once | INTRAVENOUS | Status: AC
  Administered 2022-08-09: 1000 mL via INTRAVENOUS
  Filled 2022-08-09: qty 1000

## 2022-08-09 NOTE — Progress Notes (Signed)
  Care Coordination Note  08/09/2022 Name: Etola Mull MRN: [id number] DOB: 1955-11-21  Audrea Muscat CIMBERLY STOFFEL is a 66 y.o. year old female who is a primary care patient of Ann Held, DO and is actively engaged with the care management team. I reached out to [name] by phone today to assist with re-scheduling an initial visit with the RN Case Manager  2 unsuccessful outreaches and 2 missed appointments with RN   Follow up plan: We have been unable to make contact with the patient for follow up.   Julian Hy, [place] Direct Dial: [phone]

## 2022-08-09 NOTE — Progress Notes (Signed)
Diagnosis: Dehydration  Provider:  Marshell Garfinkel MD  Procedure: Infusion  IV Type: Peripheral, IV Location: R Hand  Normal Saline, Dose: 1000 ml  Infusion Start Time: 5025  Infusion Stop Time: 0950  Post Infusion IV Care: Patient declined observation and Peripheral IV Discontinued  Discharge: Condition: Good, Destination: Home . AVS provided to patient.   Performed by:  Baxter Hire, RN

## 2022-08-10 ENCOUNTER — Ambulatory Visit (HOSPITAL_COMMUNITY)
Admission: RE | Admit: 2022-08-10 | Discharge: 2022-08-10 | Disposition: A | Discharge status: Home / Self Care | Payer: Medicare Other | Source: Ambulatory Visit | Attending: Family Medicine | Admitting: Family Medicine

## 2022-08-10 ENCOUNTER — Other Ambulatory Visit (HOSPITAL_COMMUNITY): Payer: Self-pay | Admitting: Nurse Practitioner

## 2022-08-10 DIAGNOSIS — Z432 Encounter for attention to ileostomy: Secondary | ICD-10-CM

## 2022-08-10 DIAGNOSIS — K50819 Crohn's disease of both small and large intestine with unspecified complications: Secondary | ICD-10-CM | POA: Insufficient documentation

## 2022-08-10 DIAGNOSIS — R198 Other specified symptoms and signs involving the digestive system and abdomen: Secondary | ICD-10-CM | POA: Insufficient documentation

## 2022-08-10 DIAGNOSIS — K91850 Pouchitis: Secondary | ICD-10-CM | POA: Diagnosis not present

## 2022-08-10 DIAGNOSIS — L24B3 Irritant contact dermatitis related to fecal or urinary stoma or fistula: Secondary | ICD-10-CM

## 2022-08-10 NOTE — Discharge Instructions (Signed)
I will set up with [facility]

## 2022-08-10 NOTE — Progress Notes (Signed)
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 Abdominal pain    Hx  . Allergy   . Anal stenosis   . Anemia   . Anxiety   . Arthritis   . Asthma    patient does not have inhaler  . Blood in stool    Hx  . Blood in urine    Hx  . Blood transfusion without reported diagnosis   . Cataract   . CKD (chronic kidney disease) stage 3, GFR 30-59 ml/min (HCC)   . Crohn's colitis, other complication ([place])   . De Quervain's tenosynovitis   . Depression   . Difficulty urinating    Hx  . Easy bruising   . Esophagitis   . Fainting    History - resolved - due to dehydration  . Fatigue    Hx  . Fibroid    Hx  . Gastric polyp   . GERD (gastroesophageal reflux disease)   . 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 Hemorrhoids, external   . Hemorrhoids, internal   . Herpes, genital    vaginal treated 07/05/12 and pt states is resolved  . History of cervical dysplasia   . History of small bowel obstruction   . Hyperlipidemia    currently no meds  . Hyperparathyroidism   . Hypertension   . Hypokalemia    Hx  . 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 Obesity   . Ovarian cyst   . Poor dental hygiene   . Pulmonary nodule, right    right upper lobe  . Rectal bleeding    Hx  . Rectal pain    Hx  . Renal insufficiency    CKD - stage 3  . RLS (restless legs syndrome)    no meds  . Tooth infection 11/2016   right low  . Ulcerative colitis   . Visual disturbance    wears glasses  . Weakness generalized    Hx - patient denies generalized weakness  . Wears dentures    upper only   Family History  Problem Relation Age of Onset  . Hypertension Mother   . Heart disease Mother        s/p pci  . Ulcerative colitis Father   . Hypertension Father   . Heart attack  Father   . Diabetes Sister   . Cancer Sister        uterine  . Pulmonary fibrosis Sister   . Ulcerative colitis Daughter   . Cancer Maternal Uncle        LUNG  . Irritable bowel syndrome Other        grandchildren  . Colon cancer Neg Hx   . Esophageal cancer Neg Hx   . Stomach cancer Neg Hx   . Rectal cancer Neg Hx    Allergies  Allergen Reactions  . Sulfa Antibiotics Hives, Itching, Rash and Swelling  . Morphine Other (See Comments)    "Gives me crazy dreams" (delusions, also)  . Sulfonamide Derivatives Hives, Itching and Swelling   Current Outpatient Medications  Medication Sig Dispense Refill Last Dose  . acetaminophen (TYLENOL) 500 MG tablet Take 1,000 mg by mouth at bedtime as needed (for pain/sleep).     Marland Kitchen  allopurinol (ZYLOPRIM) 300 MG tablet TAKE ONE TABLET BY MOUTH DAILY AT 9AM (Patient taking differently: Take 150 mg by mouth at bedtime.) 90 tablet 1   . amLODipine (NORVASC) 5 MG tablet Take 5 mg by mouth daily.     . Calcium Carbonate Antacid (TUMS EXTRA STRENGTH 750 PO) Take 2 tablets by mouth daily as needed (CHEW for heartburn).     . cholestyramine light (PREVALITE) 4 g packet Take 1 packet (4 g total) by mouth 3 (three) times daily after meals. 42 packet 1   . diphenhydrAMINE (BENADRYL ALLERGY) 25 MG tablet Take 25 mg by mouth at bedtime.     . DULoxetine (CYMBALTA) 60 MG capsule TAKE TWO CAPSULES (120MG) BY MOUTH DAILY AT 9AM (Patient taking differently: Take 120 mg by mouth daily.) 180 capsule 1   . folic acid (FOLVITE) 1 MG tablet Take 2 tablets (2 mg total) by mouth daily. (Patient taking differently: Take 1 mg by mouth daily.) 180 tablet 3   . GAS-X EXTRA STRENGTH 125 MG CAPS Take 125 mg by mouth at bedtime.     . metoprolol succinate (TOPROL-XL) 100 MG 24 hr tablet Take 50 mg by mouth See admin instructions. Take 50 mg by mouth in the morning and afternoon     . OVER THE COUNTER MEDICATION Take 1 tablet by mouth See admin instructions. Nervive Nerve Health  tablet- Take 1 tablet by mouth at bedtime     . Vitamin D, Ergocalciferol, (DRISDOL) 1.25 MG (50000 UNIT) CAPS capsule Take 50,000 Units by mouth every 30 (thirty) days.      No current facility-administered medications for this encounter.   Facility-Administered Medications Ordered in Other Encounters  Medication Dose Route Frequency Provider Last Rate Last Admin  . sodium chloride 0.9 % bolus 1,000 mL  1,000 mL Intravenous Once Armbruster, Carlota Raspberry, MD       ROS  Review of Systems  Gastrointestinal:        RLQ ileostomy Rounded abdomen   Skin:  Positive for color change.       Peristomal skin red with pouch removal, resolves quickly  All other systems reviewed and are negative.  Vital signs:  BP (!) 159/88 (BP Location: Right Arm)   Pulse 100   Temp 98 F (36.7 C) (Oral)   Resp 20   SpO2 97%  Exam:  Physical Exam Vitals reviewed.  Constitutional:      Appearance: She is obese.  Abdominal:     Palpations: Abdomen is soft.     Comments: Rounded abdomen  Skin:    General: Skin is warm and dry.     Findings: Erythema present.  Neurological:     Mental Status: She is alert and oriented to person, place, and time.  Psychiatric:        Mood and Affect: Mood normal.        Behavior: Behavior normal.    Stoma type/location:  RMQ ileostomy Stomal assessment/size:  1" slightly budded Peristomal assessment:  intact,  rounded abdomen  Treatment options for stomal/peristomal skin: stoma powder, skin prep barrier ring 1 piece convex pouch  occasionally wears belt.  Changes once weekly.  Output: liquid brown stool Ostomy pouching: 1pc. Convex  wears [employer] pouches.Her supplier, [employer] no longer carries her style pouch.  Will switch to [employer] for supplies.  Education provided:  independent with care.  Changes weekly.  And PRN leakage.     Impression/dx  Ileostomy  Discussion  Will set up with [facility]  See back as needed.     Visit time: 45 minutes.   Domenic Moras FNP-BC

## 2022-08-11 ENCOUNTER — Ambulatory Visit (INDEPENDENT_AMBULATORY_CARE_PROVIDER_SITE_OTHER): Payer: Medicare Other

## 2022-08-11 VITALS — BP 138/93 | HR 89 | Temp 97.8°F | Resp 18 | Ht 63.0 in | Wt 176.6 lb

## 2022-08-11 DIAGNOSIS — E860 Dehydration: Secondary | ICD-10-CM

## 2022-08-11 MED ORDER — SODIUM CHLORIDE 0.9 % IV BOLUS
1000.0000 mL | Freq: Once | INTRAVENOUS | Status: AC
  Administered 2022-08-11: 1000 mL via INTRAVENOUS

## 2022-08-11 NOTE — Progress Notes (Signed)
Diagnosis: Dehydration  Provider:  Marshell Garfinkel MD  Procedure: Infusion  IV Type: Peripheral, IV Location: R Forearm  Normal Saline, Dose: 1000 ml  Infusion Start Time: 0819  Infusion Stop Time: 0924  Post Infusion IV Care: Peripheral IV Discontinued  Discharge: Condition: Good, Destination: Home . AVS provided to patient.   Performed by:  Cleophus Molt, RN

## 2022-08-11 NOTE — Patient Instructions (Signed)
[id number] 

## 2022-08-13 ENCOUNTER — Telehealth: Payer: Self-pay | Admitting: Gastroenterology

## 2022-08-13 ENCOUNTER — Ambulatory Visit (INDEPENDENT_AMBULATORY_CARE_PROVIDER_SITE_OTHER): Payer: Medicare Other | Admitting: *Deleted

## 2022-08-13 VITALS — BP 137/83 | HR 84 | Temp 97.7°F | Resp 16 | Ht 63.0 in | Wt 176.6 lb

## 2022-08-13 DIAGNOSIS — E860 Dehydration: Secondary | ICD-10-CM

## 2022-08-13 MED ORDER — SODIUM CHLORIDE 0.9 % IV BOLUS
1000.0000 mL | Freq: Once | INTRAVENOUS | Status: AC
  Administered 2022-08-13: 1000 mL via INTRAVENOUS
  Filled 2022-08-13: qty 1000

## 2022-08-13 NOTE — Telephone Encounter (Signed)
07/26/22 office note printed and faxed to Dr. Donato Heinz at [facility] (P: [phone], F: [phone]).

## 2022-08-13 NOTE — Telephone Encounter (Signed)
Inbound call from patient requesting that we send last OV note from Dr. Havery Moros to Dr. Marval Regal. Please advise.

## 2022-08-13 NOTE — Progress Notes (Signed)
Diagnosis: Dehydration  Provider:  Marshell Garfinkel MD  Procedure: Infusion  IV Type: Peripheral, IV Location: L Forearm  Normal Saline, Dose: 1000 ml  Infusion Start Time: 0833  Infusion Stop Time: 0938  Post Infusion IV Care: Patient declined observation and Peripheral IV Discontinued  Discharge: Condition: Good, Destination: Home . AVS provided to patient.   Performed by:  Baxter Hire, RN

## 2022-08-16 ENCOUNTER — Ambulatory Visit (INDEPENDENT_AMBULATORY_CARE_PROVIDER_SITE_OTHER): Payer: Medicare Other

## 2022-08-16 VITALS — BP 140/84 | HR 89 | Temp 98.0°F | Resp 20 | Ht 63.0 in | Wt 178.0 lb

## 2022-08-16 DIAGNOSIS — E860 Dehydration: Secondary | ICD-10-CM | POA: Diagnosis not present

## 2022-08-16 MED ORDER — SODIUM CHLORIDE 0.9 % IV BOLUS
1000.0000 mL | Freq: Once | INTRAVENOUS | Status: AC
  Administered 2022-08-16: 1000 mL via INTRAVENOUS
  Filled 2022-08-16: qty 1000

## 2022-08-16 NOTE — Progress Notes (Signed)
Diagnosis: Dehydration  Provider:  Marshell Garfinkel MD  Procedure: Infusion  IV Type: Peripheral, IV Location: L Forearm  Normal Saline, Dose: 1000 ml  Infusion Start Time: 1771  Infusion Stop Time: 0924  Post Infusion IV Care: Peripheral IV Discontinued  Discharge: Condition: Good, Destination: Home . AVS provided to patient.   Performed by:  Arnoldo Morale, RN

## 2022-08-17 ENCOUNTER — Ambulatory Visit: Payer: Self-pay

## 2022-08-17 NOTE — Patient Outreach (Signed)
  Care Coordination   Initial Visit Note   08/17/2022 Name: [name] MRN: [id number] DOB: October 20, 1955  [name] [name] is a 66 y.o. year old female who sees [name], Jeanette Apa, DO for primary care. I spoke with  [name] by phone today.  What matters to the patients health and wellness today?  Patient reports she is doing well at this time. She reports IV infusions to help keep her hydrated has been working to keep her out of the hospital. She states she is following up with gastroenterologist and nephrologists recommended. She denies any questions or concerns at this time. Decline participation in the care coordination program at this time. Patient to contact RNCM if care coordination needs in the future.   Goals Addressed             This Visit's Progress    COMPLETED: Care Coordination Activities       Care Coordination Interventions: Disccussed care coordination program Assessed for additional care coordination needs Encouraged to continue to take medications as prescribed Encouraged to continue to attend provider visits as scheduled Discussed importance of making sure that she has output to avoid fluid retention from IV infusions, h/o kidney issues. Discussed weights. Patient reports she weighs to make sure she is not holding onto fluid.  Encouraged to contact RNCM(contact number provided) or primary care provider if care coordination needs in the future.        SDOH assessments and interventions completed:  Yes  SDOH Interventions Today    Flowsheet Row Most Recent Value  SDOH Interventions   Food Insecurity Interventions Intervention Not Indicated  Utilities Interventions Intervention Not Indicated     Care Coordination Interventions:  Yes, provided   Follow up plan: No further intervention required.   Encounter Outcome:  Pt. Visit Completed   Jeanette Silversmith, RN, MSN, BSN, CCM Care Management Coordinator [phone]

## 2022-08-17 NOTE — Patient Instructions (Signed)
.  care

## 2022-08-18 ENCOUNTER — Ambulatory Visit (INDEPENDENT_AMBULATORY_CARE_PROVIDER_SITE_OTHER): Payer: Medicare Other

## 2022-08-18 VITALS — BP 152/91 | HR 88 | Temp 97.8°F | Resp 18 | Ht 63.0 in | Wt 177.6 lb

## 2022-08-18 DIAGNOSIS — E860 Dehydration: Secondary | ICD-10-CM

## 2022-08-18 MED ORDER — SODIUM CHLORIDE 0.9 % IV BOLUS
1000.0000 mL | Freq: Once | INTRAVENOUS | Status: AC
  Administered 2022-08-18: 1000 mL via INTRAVENOUS
  Filled 2022-08-18: qty 1000

## 2022-08-18 NOTE — Progress Notes (Signed)
Diagnosis: Dehydration  Provider:  Marshell Garfinkel MD  Procedure: Infusion  IV Type: Peripheral, IV Location: R Hand  Normal Saline, Dose: 1000 ml  Infusion Start Time: 0825  Infusion Stop Time: 1980  Post Infusion IV Care: Peripheral IV Discontinued  Discharge: Condition: Good, Destination: Home . AVS provided to patient.   Performed by:  Arnoldo Morale, RN

## 2022-08-20 ENCOUNTER — Ambulatory Visit (INDEPENDENT_AMBULATORY_CARE_PROVIDER_SITE_OTHER): Payer: Medicare Other

## 2022-08-20 VITALS — BP 121/83 | HR 82 | Temp 97.6°F | Resp 18 | Ht 63.0 in | Wt 178.6 lb

## 2022-08-20 DIAGNOSIS — E860 Dehydration: Secondary | ICD-10-CM

## 2022-08-20 MED ORDER — SODIUM CHLORIDE 0.9 % IV SOLN: Freq: Once | INTRAVENOUS | Status: AC

## 2022-08-20 NOTE — Telephone Encounter (Signed)
Patient is calling wishing to have her fluids extended until Jan 9th. Please advise

## 2022-08-20 NOTE — Telephone Encounter (Signed)
That's fine, okay to extend

## 2022-08-20 NOTE — Telephone Encounter (Signed)
See note below regarding IV fluids and advise.

## 2022-08-20 NOTE — Telephone Encounter (Signed)
Please see note below. Dr. Havery Moros would like to extend the pts IV fluids until 09/14/22.

## 2022-08-20 NOTE — Progress Notes (Signed)
Diagnosis: Dehydration  Provider:  Marshell Garfinkel MD  Procedure: Infusion  IV Type: Peripheral, IV Location: R Hand  Cathflo (Altepase), Normal Saline, Dose: 1000 ml  Infusion Start Time: 0831  Infusion Stop Time: 0935  Post Infusion IV Care: Peripheral IV Discontinued  Discharge: Condition: Good, Destination: Home . AVS provided to patient.   Performed by:  Cleophus Molt, RN

## 2022-08-23 ENCOUNTER — Other Ambulatory Visit (HOSPITAL_COMMUNITY): Payer: Self-pay | Admitting: Pharmacy Technician

## 2022-08-23 NOTE — Telephone Encounter (Signed)
Inbound call from patient calling to f/u. Advised provider is aware and is waiting to hear back from infusion. Patient advised understanding.

## 2022-08-23 NOTE — Telephone Encounter (Signed)
Noted  

## 2022-08-23 NOTE — Telephone Encounter (Signed)
Noted, thanks!

## 2022-08-24 ENCOUNTER — Ambulatory Visit (INDEPENDENT_AMBULATORY_CARE_PROVIDER_SITE_OTHER): Payer: Medicare Other

## 2022-08-24 VITALS — BP 138/83 | HR 78 | Temp 97.5°F | Resp 16 | Ht 63.0 in | Wt 180.8 lb

## 2022-08-24 DIAGNOSIS — E860 Dehydration: Secondary | ICD-10-CM

## 2022-08-24 MED ORDER — SODIUM CHLORIDE 0.9 % IV BOLUS
1000.0000 mL | Freq: Once | INTRAVENOUS | Status: AC
  Administered 2022-08-24: 1000 mL via INTRAVENOUS

## 2022-08-24 NOTE — Progress Notes (Signed)
Diagnosis: Dehydration  Provider:  Marshell Garfinkel MD  Procedure: Infusion  IV Type: Peripheral, IV Location: L Antecubital  Normal Saline, Dose: 1000 ml  Infusion Start Time: 0824  Infusion Stop Time: 0929  Post Infusion IV Care:     Discharge: Condition: Good, Destination: Home . AVS provided to patient.   Performed by:  Arnoldo Morale, RN

## 2022-08-26 ENCOUNTER — Ambulatory Visit (INDEPENDENT_AMBULATORY_CARE_PROVIDER_SITE_OTHER): Payer: Medicare Other

## 2022-08-26 VITALS — BP 124/78 | HR 86 | Temp 98.1°F | Resp 16 | Ht 63.0 in | Wt 180.4 lb

## 2022-08-26 DIAGNOSIS — E860 Dehydration: Secondary | ICD-10-CM

## 2022-08-26 MED ORDER — SODIUM CHLORIDE 0.9 % IV BOLUS
1000.0000 mL | Freq: Once | INTRAVENOUS | Status: AC
  Administered 2022-08-26: 1000 mL via INTRAVENOUS

## 2022-08-26 NOTE — Progress Notes (Signed)
Diagnosis: Dehydration  Provider:  Marshell Garfinkel MD  Procedure: Infusion  IV Type: Peripheral, IV Location: R Antecubital  Normal Saline, Dose: 1000 ml  Infusion Start Time: 7395  Infusion Stop Time: 8441  Post Infusion IV Care: Peripheral IV Discontinued  Discharge: Condition: Good, Destination: Home . AVS provided to patient.   Performed by:  Koren Shiver, RN

## 2022-08-31 ENCOUNTER — Ambulatory Visit (INDEPENDENT_AMBULATORY_CARE_PROVIDER_SITE_OTHER): Payer: Medicare Other

## 2022-08-31 ENCOUNTER — Telehealth: Payer: Self-pay | Admitting: Family Medicine

## 2022-08-31 VITALS — BP 150/85 | HR 90 | Temp 97.5°F | Resp 16 | Ht 63.0 in | Wt 178.0 lb

## 2022-08-31 DIAGNOSIS — E860 Dehydration: Secondary | ICD-10-CM | POA: Diagnosis not present

## 2022-08-31 MED ORDER — SODIUM CHLORIDE 0.9 % IV BOLUS
1000.0000 mL | Freq: Once | INTRAVENOUS | Status: AC
  Administered 2022-08-31: 1000 mL via INTRAVENOUS
  Filled 2022-08-31: qty 1000

## 2022-08-31 NOTE — Telephone Encounter (Signed)
Pt was wondering if pcp could order 2L instead of 1of fluids to get done at the infusion center. Her other dr's were not able to order them, please advise.

## 2022-08-31 NOTE — Progress Notes (Signed)
Diagnosis: Dehydration  Provider:  Marshell Garfinkel MD  Procedure: Infusion  IV Type: Peripheral, IV Location: L Antecubital  Normal Saline, Dose: 1000 ml  Infusion Start Time: 3704  Infusion Stop Time: 8889  Post Infusion IV Care: Peripheral IV Discontinued  Discharge: Condition: Good, Destination: Home . AVS provided to patient.   Performed by:  Adelina Mings, LPN

## 2022-09-02 ENCOUNTER — Ambulatory Visit (INDEPENDENT_AMBULATORY_CARE_PROVIDER_SITE_OTHER): Payer: Medicare Other

## 2022-09-02 VITALS — BP 90/57 | HR 97 | Temp 97.7°F | Resp 16 | Ht 63.0 in | Wt 181.8 lb

## 2022-09-02 DIAGNOSIS — E860 Dehydration: Secondary | ICD-10-CM

## 2022-09-02 MED ORDER — SODIUM CHLORIDE 0.9 % IV BOLUS
1000.0000 mL | Freq: Once | INTRAVENOUS | Status: AC
  Administered 2022-09-02: 1000 mL via INTRAVENOUS

## 2022-09-02 NOTE — Telephone Encounter (Signed)
Spoke with patient. Pt has been set up for infusions

## 2022-09-02 NOTE — Progress Notes (Signed)
Diagnosis: Dehydration  Provider:  Marshell Garfinkel MD  Procedure: Infusion  IV Type: Peripheral, IV Location: L Forearm  Normal Saline, Dose: 2L  Infusion Start Time: 9672  Infusion Stop Time: 2773  Post Infusion IV Care: Peripheral IV Discontinued  Discharge: Condition: Good, Destination: Home . AVS provided to patient.   Performed by:  Beryle Flock, RN

## 2022-09-07 ENCOUNTER — Ambulatory Visit (INDEPENDENT_AMBULATORY_CARE_PROVIDER_SITE_OTHER): Payer: Medicare Other

## 2022-09-07 VITALS — BP 141/82 | HR 80 | Temp 97.9°F | Resp 16 | Ht 63.0 in | Wt 180.8 lb

## 2022-09-07 DIAGNOSIS — E860 Dehydration: Secondary | ICD-10-CM | POA: Diagnosis not present

## 2022-09-07 MED ORDER — SODIUM CHLORIDE 0.9 % IV BOLUS
1000.0000 mL | Freq: Once | INTRAVENOUS | Status: AC
  Administered 2022-09-07: 1000 mL via INTRAVENOUS
  Filled 2022-09-07: qty 1000

## 2022-09-07 NOTE — Progress Notes (Signed)
Diagnosis: Dehydration  Provider:  Marshell Garfinkel MD  Procedure: Infusion  IV Type: Peripheral, IV Location: L Hand  Normal Saline, Dose: 1000 ml  Infusion Start Time: 0826  Infusion Stop Time: 2763  Post Infusion IV Care: Peripheral IV Discontinued  Discharge: Condition: Good, Destination: Home . AVS provided to patient.   Performed by:  Arnoldo Morale, RN

## 2022-09-10 ENCOUNTER — Ambulatory Visit (INDEPENDENT_AMBULATORY_CARE_PROVIDER_SITE_OTHER): Payer: Medicare Other

## 2022-09-10 VITALS — BP 129/83 | HR 78 | Temp 98.2°F | Resp 18 | Ht 63.0 in | Wt 181.2 lb

## 2022-09-10 DIAGNOSIS — L24B3 Irritant contact dermatitis related to fecal or urinary stoma or fistula: Secondary | ICD-10-CM | POA: Diagnosis not present

## 2022-09-10 DIAGNOSIS — E860 Dehydration: Secondary | ICD-10-CM | POA: Diagnosis not present

## 2022-09-10 DIAGNOSIS — Z932 Ileostomy status: Secondary | ICD-10-CM | POA: Diagnosis not present

## 2022-09-10 MED ORDER — SODIUM CHLORIDE 0.9 % IV BOLUS
1000.0000 mL | Freq: Once | INTRAVENOUS | Status: AC
  Administered 2022-09-10: 1000 mL via INTRAVENOUS

## 2022-09-10 NOTE — Progress Notes (Signed)
Diagnosis: Dehydration  Provider:  Marshell Garfinkel MD  Procedure: Infusion  IV Type: Peripheral, IV Location: R Forearm  Normal Saline, Dose: 1000 ml  Infusion Start Time: 0913  Infusion Stop Time: 7282  Post Infusion IV Care: Peripheral IV Discontinued  Discharge: Condition: Good, Destination: Home . AVS provided to patient.   Performed by:  Koren Shiver, RN

## 2022-09-13 ENCOUNTER — Encounter: Payer: Self-pay | Admitting: Family Medicine

## 2022-09-13 ENCOUNTER — Encounter: Payer: Self-pay | Admitting: Family

## 2022-09-13 ENCOUNTER — Ambulatory Visit (INDEPENDENT_AMBULATORY_CARE_PROVIDER_SITE_OTHER): Payer: Medicare Other

## 2022-09-13 VITALS — BP 150/86 | HR 106 | Temp 98.0°F | Resp 20 | Ht 63.0 in | Wt 182.0 lb

## 2022-09-13 DIAGNOSIS — E860 Dehydration: Secondary | ICD-10-CM | POA: Diagnosis not present

## 2022-09-13 MED ORDER — SODIUM CHLORIDE 0.9 % IV BOLUS
1000.0000 mL | Freq: Once | INTRAVENOUS | Status: AC
  Administered 2022-09-13: 1000 mL via INTRAVENOUS

## 2022-09-13 NOTE — Progress Notes (Signed)
Diagnosis: Dehydration  Provider:  Marshell Garfinkel MD  Procedure: Infusion  IV Type: Peripheral, IV Location: L Antecubital  Normal Saline, Dose: 1000 ml  Infusion Start Time: 0831  Infusion Stop Time: 0937  Post Infusion IV Care: Peripheral IV Discontinued  Discharge: Condition: Good, Destination: Home . AVS provided to patient.   Performed by:  Arnoldo Morale, RN

## 2022-09-14 DIAGNOSIS — E876 Hypokalemia: Secondary | ICD-10-CM | POA: Diagnosis not present

## 2022-09-14 DIAGNOSIS — E871 Hypo-osmolality and hyponatremia: Secondary | ICD-10-CM | POA: Diagnosis not present

## 2022-09-14 DIAGNOSIS — D509 Iron deficiency anemia, unspecified: Secondary | ICD-10-CM | POA: Diagnosis not present

## 2022-09-14 DIAGNOSIS — K5090 Crohn's disease, unspecified, without complications: Secondary | ICD-10-CM | POA: Diagnosis not present

## 2022-09-14 DIAGNOSIS — N179 Acute kidney failure, unspecified: Secondary | ICD-10-CM | POA: Diagnosis not present

## 2022-09-14 DIAGNOSIS — N184 Chronic kidney disease, stage 4 (severe): Secondary | ICD-10-CM | POA: Diagnosis not present

## 2022-09-14 DIAGNOSIS — R768 Other specified abnormal immunological findings in serum: Secondary | ICD-10-CM | POA: Diagnosis not present

## 2022-09-14 DIAGNOSIS — N2581 Secondary hyperparathyroidism of renal origin: Secondary | ICD-10-CM | POA: Diagnosis not present

## 2022-09-14 DIAGNOSIS — N390 Urinary tract infection, site not specified: Secondary | ICD-10-CM | POA: Diagnosis not present

## 2022-09-14 DIAGNOSIS — M109 Gout, unspecified: Secondary | ICD-10-CM | POA: Diagnosis not present

## 2022-09-14 DIAGNOSIS — D631 Anemia in chronic kidney disease: Secondary | ICD-10-CM | POA: Diagnosis not present

## 2022-09-14 DIAGNOSIS — I129 Hypertensive chronic kidney disease with stage 1 through stage 4 chronic kidney disease, or unspecified chronic kidney disease: Secondary | ICD-10-CM | POA: Diagnosis not present

## 2022-09-14 DIAGNOSIS — E88819 Insulin resistance, unspecified: Secondary | ICD-10-CM | POA: Diagnosis not present

## 2022-09-20 ENCOUNTER — Other Ambulatory Visit: Payer: Self-pay

## 2022-09-21 ENCOUNTER — Ambulatory Visit (INDEPENDENT_AMBULATORY_CARE_PROVIDER_SITE_OTHER): Payer: Medicare Other

## 2022-09-21 VITALS — BP 124/82 | HR 91 | Temp 97.9°F | Resp 18 | Ht 63.0 in | Wt 182.0 lb

## 2022-09-21 DIAGNOSIS — E860 Dehydration: Secondary | ICD-10-CM | POA: Diagnosis not present

## 2022-09-21 MED ORDER — SODIUM CHLORIDE 0.9 % IV BOLUS
1000.0000 mL | Freq: Once | INTRAVENOUS | Status: AC
  Administered 2022-09-21: 1000 mL via INTRAVENOUS

## 2022-09-21 NOTE — Progress Notes (Signed)
Diagnosis: Dehydration  Provider:  Marshell Garfinkel MD  Procedure: Infusion  IV Type: Peripheral, IV Location: L Forearm  Normal Saline, Dose: 1000 ml  Infusion Start Time: 0826  Infusion Stop Time: 0939  Post Infusion IV Care: Peripheral IV Discontinued  Discharge: Condition: Good, Destination: Home . AVS provided to patient.   Performed by:  Cleophus Molt, RN

## 2022-09-24 ENCOUNTER — Other Ambulatory Visit: Payer: Self-pay | Admitting: Pharmacy Technician

## 2022-09-24 ENCOUNTER — Ambulatory Visit (INDEPENDENT_AMBULATORY_CARE_PROVIDER_SITE_OTHER): Payer: Medicare Other

## 2022-09-24 VITALS — BP 120/77 | HR 83 | Temp 97.7°F | Resp 18 | Ht 63.0 in | Wt 184.0 lb

## 2022-09-24 DIAGNOSIS — E860 Dehydration: Secondary | ICD-10-CM

## 2022-09-24 MED ORDER — SODIUM CHLORIDE 0.9 % IV BOLUS
1000.0000 mL | Freq: Once | INTRAVENOUS | Status: AC
  Administered 2022-09-24: 1000 mL via INTRAVENOUS

## 2022-09-24 NOTE — Progress Notes (Signed)
Diagnosis: Dehydration  Provider:  Marshell Garfinkel MD  Procedure: Infusion  IV Type: Peripheral, IV Location: R Forearm  Normal Saline, Dose: 1000 ml  Infusion Start Time: 0822  Infusion Stop Time: 0923  Post Infusion IV Care: Peripheral IV Discontinued  Discharge: Condition: Good, Destination: Home . AVS provided to patient.   Performed by:  Cleophus Molt, RN

## 2022-09-28 ENCOUNTER — Ambulatory Visit (INDEPENDENT_AMBULATORY_CARE_PROVIDER_SITE_OTHER): Payer: Medicare Other

## 2022-09-28 VITALS — BP 171/95 | HR 89 | Temp 97.8°F | Resp 18 | Ht 63.0 in | Wt 184.2 lb

## 2022-09-28 DIAGNOSIS — E860 Dehydration: Secondary | ICD-10-CM

## 2022-09-28 MED ORDER — SODIUM CHLORIDE 0.9 % IV BOLUS
1000.0000 mL | Freq: Once | INTRAVENOUS | Status: AC
  Administered 2022-09-28: 1000 mL via INTRAVENOUS

## 2022-09-28 MED ORDER — SODIUM CHLORIDE 0.9 % IV SOLN
1000.0000 mL | Freq: Once | INTRAVENOUS | Status: AC
  Administered 2022-09-28: 1000 mL via INTRAVENOUS

## 2022-09-28 NOTE — Progress Notes (Signed)
Diagnosis: Dehydration  Provider:  Marshell Garfinkel MD  Procedure: Infusion  IV Type: Peripheral, IV Location: L Antecubital  Normal Saline, 2052m  Infusion Start Time: 08599 Infusion Stop Time: 12341 Post Infusion IV Care: Peripheral IV Discontinued  Discharge: Condition: Good, Destination: Home . AVS provided to patient.   Performed by:  SArnoldo Morale RN

## 2022-09-29 ENCOUNTER — Other Ambulatory Visit: Payer: Self-pay | Admitting: Pharmacy Technician

## 2022-09-29 ENCOUNTER — Telehealth: Payer: Medicare Other | Admitting: Medical

## 2022-09-30 ENCOUNTER — Encounter: Payer: Self-pay | Admitting: Family Medicine

## 2022-09-30 ENCOUNTER — Ambulatory Visit (INDEPENDENT_AMBULATORY_CARE_PROVIDER_SITE_OTHER): Payer: Medicare Other | Admitting: Family Medicine

## 2022-09-30 VITALS — BP 160/100 | HR 88 | Temp 98.1°F | Resp 18 | Ht 63.0 in | Wt 184.8 lb

## 2022-09-30 DIAGNOSIS — R21 Rash and other nonspecific skin eruption: Secondary | ICD-10-CM | POA: Diagnosis not present

## 2022-09-30 MED ORDER — DOXYCYCLINE HYCLATE 100 MG PO TABS: 100.0000 mg | ORAL_TABLET | Freq: Two times a day (BID) | ORAL | 0 refills | Status: DC

## 2022-09-30 MED ORDER — PREDNISONE 10 MG PO TABS: ORAL_TABLET | ORAL | 0 refills | Status: DC

## 2022-09-30 NOTE — Progress Notes (Addendum)
Subjective:   By signing my name below, I, [name], attest that this documentation has been prepared under the direction and in the presence of Ann Held, DO. 09/30/2022   Patient ID: [name], female    DOB: 1955-10-25, 67 y.o.   MRN: [id number]  Chief Complaint  Patient presents with   Rash    Pt states rash started out over the weekend. Pt states rash is on her chest and some around her neck and back. Pt states having itchiness    Rash Pertinent negatives include no congestion, fever or shortness of breath.   Patient is in today for a office visit.   She complains of itching rash on her chest and both arms. Her rashes break out in blisters after she scratches them. Her rash is both arms then spread to her chest. She notes it started on her on her legs before moving to her arms. She is spending time at her mothers house regularly and is around her cats. She is also stressed due to taking care of her mother and her mothers injury. She is taking benadryl at night to manage her symptoms and finds mild relief. She has a allergy to hay and reports her pet rabbit will occasionally get in hay.    Past Medical History:  Diagnosis Date   Abdominal pain    Hx   Allergy    Anal stenosis    Anemia    Anxiety    Arthritis    Asthma    patient does not have inhaler   Blood in stool    Hx   Blood in urine    Hx   Blood transfusion without reported diagnosis    Cataract    CKD (chronic kidney disease) stage 3, GFR 30-59 ml/min (HCC)    Crohn's colitis, other complication (HCC)    De Quervain's tenosynovitis    Depression    Difficulty urinating    Hx   Easy bruising    Esophagitis    Fainting    History - resolved - due to dehydration   Fatigue    Hx   Fibroid    Hx   Gastric polyp    GERD (gastroesophageal reflux disease)    Hearing loss    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   Hemorrhoids, external    Hemorrhoids, internal    Herpes, genital     vaginal treated 07/05/12 and pt states is resolved   History of cervical dysplasia    History of small bowel obstruction    Hyperlipidemia    currently no meds   Hyperparathyroidism    Hypertension    Hypokalemia    Hx   IBD (inflammatory bowel disease)    initially colectomy for suspected UC, 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   Obesity    Ovarian cyst    Poor dental hygiene    Pulmonary nodule, [name]    [name] upper lobe   Rectal bleeding    Hx   Rectal pain    Hx   Renal insufficiency    CKD - stage 3   RLS (restless legs syndrome)    no meds   Tooth infection 11/2016   [name] low   Ulcerative colitis    Visual disturbance    wears glasses   Weakness generalized    Hx - patient denies generalized weakness   Wears dentures    upper  only    Past Surgical History:  Procedure Laterality Date   ANAL DILATION     BIOPSY  07/20/2022   Procedure: BIOPSY;  Surgeon: Ladene Artist, MD;  Location: [facility];  Service: Gastroenterology;;   CERVICAL BIOPSY  W/ LOOP ELECTRODE EXCISION     CHOLECYSTECTOMY     COLONOSCOPY     [name]   fatty tumor removed from back     X 2   HEMORRHOID SURGERY     ILEOSCOPY N/A 07/20/2022   Procedure: ILEOSCOPY THROUGH STOMA;  Surgeon: Ladene Artist, MD;  Location: [facility];  Service: Gastroenterology;  Laterality: N/A;   ILEOSTOMY CLOSURE     RESTORATIVE PROCTOCOLECTOMY     with insertion of ileoanal J Pouch with loop ileostomy   SHOULDER ARTHROSCOPY WITH CAPSULORRHAPHY Left 06/14/2019   Procedure: LEFT SHOULDER ARTHRSCOPIC REPAIR OF BONY BANKART FRACTURE;  Surgeon: Tania Ade, MD;  Location: [facility];  Service: Orthopedics;  Laterality: Left;  REQUEST 66 MINUTE   SIGMOIDOSCOPY     TOTAL ABDOMINAL HYSTERECTOMY  1998   TAH/LSO   TUBAL LIGATION     UPPER GASTROINTESTINAL ENDOSCOPY     [name]    Family History  Problem Relation Age of Onset   Hypertension Mother    Heart disease Mother        s/p pci    Ulcerative colitis Father    Hypertension Father    Heart attack Father    Diabetes Sister    Cancer Sister        uterine   Pulmonary fibrosis Sister    Ulcerative colitis Daughter    Cancer Maternal Uncle        LUNG   Irritable bowel syndrome Other        grandchildren   Colon cancer Neg Hx    Esophageal cancer Neg Hx    Stomach cancer Neg Hx    Rectal cancer Neg Hx     Social History   Socioeconomic History   Marital status: Divorced    Spouse name: Not on file   Number of children: Not on file   Years of education: Not on file   Highest education level: Not on file  Occupational History   Not on file  Tobacco Use   Smoking status: Former    Packs/day: 1.00    Years: 4.00    Total pack years: 4.00    Types: Cigarettes    Quit date: 05/21/1979    Years since quitting: 43.3   Smokeless tobacco: Never  Vaping Use   Vaping Use: Never used  Substance and Sexual Activity   Alcohol use: No    Alcohol/week: 0.0 standard drinks of alcohol   Drug use: No   Sexual activity: Yes    Birth control/protection: Post-menopausal    Comment: 1st intercourse 67 yo-Fewer than 5 partners  Other Topics Concern   Not on file  Social History Narrative   Not on file   Social Determinants of Health   Financial Resource Strain: Low Risk  (05/26/2022)   Overall Financial Resource Strain (CARDIA)    Difficulty of Paying Living Expenses: Not hard at all  Food Insecurity: No Food Insecurity (08/17/2022)   Hunger Vital Sign    Worried About Running Out of Food in the Last Year: Never true    Ran Out of Food in the Last Year: Never true  Transportation Needs: No Transportation Needs (08/17/2022)   PRAPARE - Transportation    Lack of Transportation (  Medical): No    Lack of Transportation (Non-Medical): No  Physical Activity: Inactive (05/26/2022)   Exercise Vital Sign    Days of Exercise per Week: 0 days    Minutes of Exercise per Session: 0 min  Stress: No Stress Concern Present  (05/26/2022)   [place]    Feeling of Stress : Not at all  Social Connections: Moderately Isolated (05/26/2022)   Social Connection and Isolation Panel [NHANES]    Frequency of Communication with Friends and Family: More than three times a week    Frequency of Social Gatherings with Friends and Family: More than three times a week    Attends Religious Services: Never    Marine scientist or Organizations: No    Attends Archivist Meetings: Never    Marital Status: Married  Human resources officer Violence: Not At Risk (07/16/2022)   Humiliation, Afraid, Rape, and Kick questionnaire    Fear of Current or Ex-Partner: No    Emotionally Abused: No    Physically Abused: No    Sexually Abused: No    Outpatient Medications Prior to Visit  Medication Sig Dispense Refill   acetaminophen (TYLENOL) 500 MG tablet Take 1,000 mg by mouth at bedtime as needed (for pain/sleep).     allopurinol (ZYLOPRIM) 300 MG tablet TAKE ONE TABLET BY MOUTH DAILY AT 9AM (Patient taking differently: Take 150 mg by mouth at bedtime.) 90 tablet 1   amLODipine (NORVASC) 5 MG tablet Take 5 mg by mouth daily.     Calcium Carbonate Antacid (TUMS EXTRA STRENGTH 750 PO) Take 2 tablets by mouth daily as needed (CHEW for heartburn).     cholestyramine light (PREVALITE) 4 g packet Take 1 packet (4 g total) by mouth 3 (three) times daily after meals. 42 packet 1   diphenhydrAMINE (BENADRYL ALLERGY) 25 MG tablet Take 25 mg by mouth at bedtime.     DULoxetine (CYMBALTA) 60 MG capsule TAKE TWO CAPSULES ('120MG'$ ) BY MOUTH DAILY AT 9AM (Patient taking differently: Take 120 mg by mouth daily.) 382 capsule 1   folic acid (FOLVITE) 1 MG tablet Take 2 tablets (2 mg total) by mouth daily. (Patient taking differently: Take 1 mg by mouth daily.) 180 tablet 3   GAS-X EXTRA STRENGTH 125 MG CAPS Take 125 mg by mouth at bedtime.     metoprolol succinate (TOPROL-XL) 100 MG 24  hr tablet Take 50 mg by mouth See admin instructions. Take 50 mg by mouth in the morning and afternoon     OVER THE COUNTER MEDICATION Take 1 tablet by mouth See admin instructions. Nervive Nerve Health tablet- Take 1 tablet by mouth at bedtime     Vitamin D, Ergocalciferol, (DRISDOL) 1.25 MG (50000 UNIT) CAPS capsule Take 50,000 Units by mouth every 30 (thirty) days.     Facility-Administered Medications Prior to Visit  Medication Dose Route Frequency Provider Last Rate Last Admin   sodium chloride 0.9 % bolus 1,000 mL  1,000 mL Intravenous Once Armbruster, Carlota Raspberry, MD        Allergies  Allergen Reactions   Sulfa Antibiotics Hives, Itching, Rash and Swelling   Morphine Other (See Comments)    "Gives me crazy dreams" (delusions, also)   Sulfonamide Derivatives Hives, Itching and Swelling    Review of Systems  Constitutional:  Negative for fever and malaise/fatigue.  HENT:  Negative for congestion.   Eyes:  Negative for blurred vision.  Respiratory:  Negative for shortness of  breath.   Cardiovascular:  Negative for chest pain, palpitations and leg swelling.  Gastrointestinal:  Negative for abdominal pain, blood in stool and nausea.  Genitourinary:  Negative for dysuria and frequency.  Musculoskeletal:  Negative for falls.  Skin:  Positive for itching (on rash sites) and rash (on chest, bilateral arms).  Neurological:  Negative for dizziness, loss of consciousness and headaches.  Endo/Heme/Allergies:  Negative for environmental allergies.  Psychiatric/Behavioral:  Negative for depression. The patient is not nervous/anxious.        Objective:    Physical Exam Vitals and nursing note reviewed.  Constitutional:      General: She is not in acute distress.    Appearance: Normal appearance. She is not ill-appearing.  HENT:     Head: Normocephalic and atraumatic.     [name] Ear: External ear normal.     Left Ear: External ear normal.  Eyes:     Extraocular Movements: Extraocular  movements intact.     Pupils: Pupils are equal, round, and reactive to light.  Cardiovascular:     Rate and Rhythm: Normal rate and regular rhythm.     Heart sounds: Normal heart sounds. No murmur heard.    No gallop.  Pulmonary:     Effort: Pulmonary effort is normal. No respiratory distress.     Breath sounds: Normal breath sounds. No wheezing or rales.  Skin:    General: Skin is warm and dry.     Findings: Rash (papular rash on chest and bilateral arms) present.     Comments: Papular rash on arms and chest   Neurological:     Mental Status: She is alert and oriented to person, place, and time.  Psychiatric:        Judgment: Judgment normal.     BP (!) 160/100 (BP Location: [name] Arm, Patient Position: Sitting, Cuff Size: Normal) Comment: Pt did not take blood pressure medication  Pulse 88   Temp 98.1 F (36.7 C) (Oral)   Resp 18   Ht '5\' 3"'$  (1.6 m)   Wt 184 lb 12.8 oz (83.8 kg)   SpO2 97%   BMI 32.74 kg/m  Wt Readings from Last 3 Encounters:  09/30/22 184 lb 12.8 oz (83.8 kg)  09/28/22 184 lb 3.2 oz (83.6 kg)  09/24/22 184 lb (83.5 kg)       Assessment & Plan:  Rash -     Doxycycline Hyclate; Take 1 tablet (100 mg total) by mouth 2 (two) times daily.  Dispense: 20 tablet; Refill: 0 -     predniSONE; TAKE 3 TABLETS PO QD FOR 3 DAYS THEN TAKE 2 TABLETS PO QD FOR 3 DAYS THEN TAKE 1 TABLET PO QD FOR 3 DAYS THEN TAKE 1/2 TAB PO QD FOR 3 DAYS  Dispense: 20 tablet; Refill: 0    I, Ann Held, DO, personally preformed the services described in this documentation.  All medical record entries made by the scribe were at my direction and in my presence.  I have reviewed the chart and discharge instructions (if applicable) and agree that the record reflects my personal performance and is accurate and complete. 09/30/2022   I,[name],acting as a scribe for Ann Held, DO.,have documented all relevant documentation on the behalf of Ann Held,  DO,as directed by  Ann Held, DO while in the presence of Ann Held, DO.   Ann Held, DO

## 2022-09-30 NOTE — Patient Instructions (Signed)
Rash, Adult A rash is a change in the color of your skin. A rash can also change the way your skin feels. There are many different conditions and factors that can cause a rash. Some rashes may disappear after a few days, but some may last for a few weeks. Common causes of rashes include: Viral infections, such as: Colds. Measles. Hand, foot, and mouth disease. Bacterial infections, such as: Scarlet fever. Impetigo. Fungal infections, such as Candida. Allergic reactions to food, medicines, or skin care products. Follow these instructions at home: The goal of treatment is to stop the itching and keep the rash from spreading. Pay attention to any changes in your symptoms. Follow these instructions to help with your condition: Medicine Take or apply over-the-counter and prescription medicines only as told by your health care provider. These may include: Corticosteroid creams to treat red or swollen skin. Anti-itch lotions. Oral allergy medicines (antihistamines). Oral corticosteroids for severe symptoms.  Skin care Apply cool compresses to the affected areas. Do not scratch or rub your skin. Avoid covering the rash. Make sure the rash is exposed to air as much as possible. Managing itching and discomfort Avoid hot showers or baths, which can make itching worse. A cold shower may help. Try taking a bath with: Epsom salts. Follow manufacturer instructions on the packaging. You can get these at your local pharmacy or grocery store. Baking soda. Pour a small amount into the bath as told by your health care provider. Colloidal oatmeal. Follow manufacturer instructions on the packaging. You can get this at your local pharmacy or grocery store. Try applying baking soda paste to your skin. Stir water into baking soda until it reaches a paste-like consistency. Try applying calamine lotion. This is an over-the-counter lotion that helps to relieve itchiness. Keep cool and out of the sun. Sweating  and being hot can make itching worse. General instructions  Rest as needed. Drink enough fluid to keep your urine pale yellow. Wear loose-fitting clothing. Avoid scented soaps, detergents, and perfumes. Use gentle soaps, detergents, perfumes, and other cosmetic products. Avoid any substance that causes your rash. Keep a journal to help track what causes your rash. Write down: What you eat. What cosmetic products you use. What you drink. What you wear. This includes jewelry. Keep all follow-up visits as told by your health care provider. This is important. Contact a health care provider if: You sweat at night. You lose weight. You urinate more than normal. You urinate less than normal, or you notice that your urine is a darker color than usual. You feel weak. You vomit. Your skin or the whites of your eyes look yellow (jaundice). Your skin: Tingles. Is numb. Your rash: Does not go away after several days. Gets worse. You are: Unusually thirsty. More tired than normal. You have: New symptoms. Pain in your abdomen. A fever. Diarrhea. Get help right away if you: Have a fever and your symptoms suddenly get worse. Develop confusion. Have a severe headache or a stiff neck. Have severe joint pains or stiffness. Have a seizure. Develop a rash that covers all or most of your body. The rash may or may not be painful. Develop blisters that: Are on top of the rash. Grow larger or grow together. Are painful. Are inside your nose or mouth. Develop a rash that: Looks like purple pinprick-sized spots all over your body. Has a "bull's eye" or looks like a target. Is not related to sun exposure, is red and painful, and causes   your skin to peel. Summary A rash is a change in the color of your skin. Some rashes disappear after a few days, but some may last for a few weeks. The goal of treatment is to stop the itching and keep the rash from spreading. Take or apply over-the-counter  and prescription medicines only as told by your health care provider. Contact a health care provider if you have new or worsening symptoms. Keep all follow-up visits as told by your health care provider. This is important. This information is not intended to replace advice given to you by your health care provider. Make sure you discuss any questions you have with your health care provider. Document Revised: 02/23/2022 Document Reviewed: 06/04/2021 Elsevier Patient Education  2023 Elsevier Inc.  

## 2022-10-01 ENCOUNTER — Telehealth: Payer: Self-pay | Admitting: Family Medicine

## 2022-10-01 ENCOUNTER — Ambulatory Visit (INDEPENDENT_AMBULATORY_CARE_PROVIDER_SITE_OTHER): Payer: Medicare Other

## 2022-10-01 ENCOUNTER — Other Ambulatory Visit: Payer: Self-pay

## 2022-10-01 VITALS — BP 135/85 | HR 81 | Temp 97.9°F | Resp 18 | Ht 63.0 in | Wt 184.0 lb

## 2022-10-01 DIAGNOSIS — E860 Dehydration: Secondary | ICD-10-CM | POA: Diagnosis not present

## 2022-10-01 DIAGNOSIS — R21 Rash and other nonspecific skin eruption: Secondary | ICD-10-CM

## 2022-10-01 MED ORDER — SODIUM CHLORIDE 0.9 % IV BOLUS
1000.0000 mL | Freq: Once | INTRAVENOUS | Status: AC
  Administered 2022-10-01: 1000 mL via INTRAVENOUS

## 2022-10-01 MED ORDER — DOXYCYCLINE HYCLATE 100 MG PO TABS: 100.0000 mg | ORAL_TABLET | Freq: Two times a day (BID) | ORAL | 0 refills | Status: DC

## 2022-10-01 MED ORDER — PREDNISONE 10 MG PO TABS: ORAL_TABLET | ORAL | 0 refills | Status: DC

## 2022-10-01 NOTE — Progress Notes (Signed)
Diagnosis: Dehydration  Provider:  Marshell Garfinkel MD  Procedure: Infusion  IV Type: Peripheral, IV Location: L Forearm  Normal Saline, Dose: 1000 ml  Infusion Start Time: 6116  Infusion Stop Time: 4353  Post Infusion IV Care: Peripheral IV Discontinued  Discharge: Condition: Good, Destination: Home . AVS provided to patient.   Performed by:  Cleophus Molt, RN

## 2022-10-01 NOTE — Telephone Encounter (Signed)
Patient states her pharmacy never received the medications sent yesterday. He would like for them to be resent. Please advise.

## 2022-10-01 NOTE — Telephone Encounter (Signed)
Rx's resent.  

## 2022-10-04 ENCOUNTER — Ambulatory Visit (INDEPENDENT_AMBULATORY_CARE_PROVIDER_SITE_OTHER): Payer: Medicare Other

## 2022-10-04 VITALS — BP 172/97 | HR 91 | Temp 96.8°F | Resp 16 | Ht 63.0 in | Wt 185.2 lb

## 2022-10-04 DIAGNOSIS — E860 Dehydration: Secondary | ICD-10-CM | POA: Diagnosis not present

## 2022-10-04 MED ORDER — SODIUM CHLORIDE 0.9 % IV BOLUS
1000.0000 mL | Freq: Once | INTRAVENOUS | Status: AC
  Administered 2022-10-04: 1000 mL via INTRAVENOUS
  Filled 2022-10-04: qty 1000

## 2022-10-04 NOTE — Progress Notes (Signed)
Diagnosis: Dehydration  Provider:  Marshell Garfinkel MD  Procedure: Infusion  IV Type: Peripheral, IV Location: L Antecubital  Normal Saline, Dose: 1000 ml  Infusion Start Time: 7824  Infusion Stop Time: 2353  Post Infusion IV Care: Peripheral IV Discontinued  Discharge: Condition: Good, Destination: Home . AVS provided to patient.   Performed by:  Adelina Mings, LPN

## 2022-10-06 ENCOUNTER — Ambulatory Visit (INDEPENDENT_AMBULATORY_CARE_PROVIDER_SITE_OTHER): Payer: Medicare Other

## 2022-10-06 ENCOUNTER — Telehealth: Payer: Self-pay

## 2022-10-06 VITALS — BP 154/84 | HR 78 | Temp 97.9°F | Resp 16 | Ht 63.0 in | Wt 182.0 lb

## 2022-10-06 DIAGNOSIS — E860 Dehydration: Secondary | ICD-10-CM

## 2022-10-06 MED ORDER — SODIUM CHLORIDE 0.9 % IV BOLUS
1000.0000 mL | Freq: Once | INTRAVENOUS | Status: AC
  Administered 2022-10-06: 1000 mL via INTRAVENOUS
  Filled 2022-10-06: qty 1000

## 2022-10-06 NOTE — Progress Notes (Signed)
Diagnosis: Dehydration  Provider:  Marshell Garfinkel MD  Procedure: Infusion  IV Type: Peripheral, IV Location: R Hand  Normal Saline, Dose: 1000 ml  Infusion Start Time: 4431  Infusion Stop Time: 0945  Post Infusion IV Care: Peripheral IV Discontinued  Discharge: Condition: Good, Destination: Home . AVS provided to patient.   Performed by:  Adelina Mings, LPN

## 2022-10-06 NOTE — Telephone Encounter (Signed)
Called and spoke to patient.  Scheduled her for an appointment  on 2-20 at 3:20 pm

## 2022-10-06 NOTE — Telephone Encounter (Signed)
-----  Message from Yetta Flock, MD sent at 10/05/2022  5:34 PM EST ----- Regarding: follow up appointment Jan can you help book this patient a follow up with me in next 3-4 weeks if any openings? Thanks

## 2022-10-08 ENCOUNTER — Ambulatory Visit (INDEPENDENT_AMBULATORY_CARE_PROVIDER_SITE_OTHER): Payer: Medicare Other

## 2022-10-08 VITALS — BP 162/93 | HR 86 | Temp 97.9°F | Resp 20 | Ht 63.0 in | Wt 183.6 lb

## 2022-10-08 DIAGNOSIS — E860 Dehydration: Secondary | ICD-10-CM | POA: Diagnosis not present

## 2022-10-08 MED ORDER — SODIUM CHLORIDE 0.9 % IV SOLN
1000.0000 mL | Freq: Once | INTRAVENOUS | Status: AC
  Administered 2022-10-08: 1000 mL via INTRAVENOUS

## 2022-10-08 NOTE — Progress Notes (Signed)
Diagnosis: Dehydration  Provider:  Marshell Garfinkel MD  Procedure: Infusion  IV Type: Peripheral, IV Location: L Forearm  Normal Saline, Dose: 1000 ml  Infusion Start Time: 2481  Infusion Stop Time: 1130  Post Infusion IV Care: Peripheral IV Discontinued  Discharge: Condition: Good, Destination: Home . AVS provided to patient.   Performed by:  Cleophus Molt, RN

## 2022-10-11 ENCOUNTER — Other Ambulatory Visit: Payer: Self-pay | Admitting: Family

## 2022-10-11 ENCOUNTER — Ambulatory Visit (INDEPENDENT_AMBULATORY_CARE_PROVIDER_SITE_OTHER): Payer: Medicare Other

## 2022-10-11 VITALS — BP 177/101 | HR 90 | Temp 98.0°F | Resp 20 | Ht 63.0 in | Wt 181.4 lb

## 2022-10-11 DIAGNOSIS — E860 Dehydration: Secondary | ICD-10-CM

## 2022-10-11 DIAGNOSIS — D500 Iron deficiency anemia secondary to blood loss (chronic): Secondary | ICD-10-CM

## 2022-10-11 MED ORDER — SODIUM CHLORIDE 0.9 % IV BOLUS
1000.0000 mL | Freq: Once | INTRAVENOUS | Status: AC
  Administered 2022-10-11: 1000 mL via INTRAVENOUS
  Filled 2022-10-11: qty 1000

## 2022-10-11 NOTE — Progress Notes (Signed)
Diagnosis: Dehydration  Provider:  Marshell Garfinkel MD  Procedure: Infusion  IV Type: Peripheral, IV Location: R Antecubital  Normal Saline, Dose: 1000 ml  Infusion Start Time: 7195  Infusion Stop Time: 0950  Post Infusion IV Care: Peripheral IV Discontinued  Discharge: Condition: Good, Destination: Home . AVS provided to patient.   Performed by:  Koren Shiver, RN

## 2022-10-12 ENCOUNTER — Encounter: Payer: Self-pay | Admitting: Family

## 2022-10-12 ENCOUNTER — Other Ambulatory Visit: Payer: Self-pay

## 2022-10-12 ENCOUNTER — Inpatient Hospital Stay: Payer: Medicare Other | Attending: Hematology & Oncology

## 2022-10-12 ENCOUNTER — Inpatient Hospital Stay: Payer: Medicare Other | Admitting: Family

## 2022-10-12 ENCOUNTER — Telehealth: Payer: Self-pay

## 2022-10-12 VITALS — BP 134/79 | HR 92 | Temp 98.6°F | Resp 18 | Ht 63.0 in | Wt 184.4 lb

## 2022-10-12 DIAGNOSIS — D508 Other iron deficiency anemias: Secondary | ICD-10-CM | POA: Diagnosis not present

## 2022-10-12 DIAGNOSIS — Z932 Ileostomy status: Secondary | ICD-10-CM | POA: Insufficient documentation

## 2022-10-12 DIAGNOSIS — K909 Intestinal malabsorption, unspecified: Secondary | ICD-10-CM | POA: Diagnosis not present

## 2022-10-12 DIAGNOSIS — K51918 Ulcerative colitis, unspecified with other complication: Secondary | ICD-10-CM | POA: Insufficient documentation

## 2022-10-12 DIAGNOSIS — D500 Iron deficiency anemia secondary to blood loss (chronic): Secondary | ICD-10-CM

## 2022-10-12 LAB — CBC WITH DIFFERENTIAL (CANCER CENTER ONLY)
Abs Immature Granulocytes: 0.23 10*3/uL — ABNORMAL HIGH (ref 0.00–0.07)
Basophils Absolute: 0.1 10*3/uL (ref 0.0–0.1)
Basophils Relative: 1 %
Eosinophils Absolute: 0.5 10*3/uL (ref 0.0–0.5)
Eosinophils Relative: 4 %
HCT: 35.9 % — ABNORMAL LOW (ref 36.0–46.0)
Hemoglobin: 11.3 g/dL — ABNORMAL LOW (ref 12.0–15.0)
Immature Granulocytes: 2 %
Lymphocytes Relative: 22 %
Lymphs Abs: 2.6 10*3/uL (ref 0.7–4.0)
MCH: 33 pg (ref 26.0–34.0)
MCHC: 31.5 g/dL (ref 30.0–36.0)
MCV: 105 fL — ABNORMAL HIGH (ref 80.0–100.0)
Monocytes Absolute: 1 10*3/uL (ref 0.1–1.0)
Monocytes Relative: 9 %
Neutro Abs: 7.5 10*3/uL (ref 1.7–7.7)
Neutrophils Relative %: 62 %
Platelet Count: 335 10*3/uL (ref 150–400)
RBC: 3.42 MIL/uL — ABNORMAL LOW (ref 3.87–5.11)
RDW: 15.7 % — ABNORMAL HIGH (ref 11.5–15.5)
WBC Count: 11.8 10*3/uL — ABNORMAL HIGH (ref 4.0–10.5)
nRBC: 0 % (ref 0.0–0.2)

## 2022-10-12 LAB — CMP (CANCER CENTER ONLY)
ALT: 17 U/L (ref 0–44)
AST: 17 U/L (ref 15–41)
Albumin: 4 g/dL (ref 3.5–5.0)
Alkaline Phosphatase: 110 U/L (ref 38–126)
Anion gap: 11 (ref 5–15)
BUN: 57 mg/dL — ABNORMAL HIGH (ref 8–23)
CO2: 20 mmol/L — ABNORMAL LOW (ref 22–32)
Calcium: 8.8 mg/dL — ABNORMAL LOW (ref 8.9–10.3)
Chloride: 109 mmol/L (ref 98–111)
Creatinine: 3.18 mg/dL (ref 0.44–1.00)
GFR, Estimated: 15 mL/min — ABNORMAL LOW (ref >60)
Glucose, Bld: 116 mg/dL — ABNORMAL HIGH (ref 70–99)
Potassium: 3.9 mmol/L (ref 3.5–5.1)
Sodium: 140 mmol/L (ref 135–145)
Total Bilirubin: 0.4 mg/dL (ref 0.3–1.2)
Total Protein: 7.5 g/dL (ref 6.5–8.1)

## 2022-10-12 LAB — IRON AND IRON BINDING CAPACITY (CC-WL,HP ONLY)
Iron: 66 ug/dL (ref 28–170)
Saturation Ratios: 17 % (ref 10.4–31.8)
TIBC: 381 ug/dL (ref 250–450)
UIBC: 315 ug/dL (ref 148–442)

## 2022-10-12 LAB — FERRITIN: Ferritin: 93 ng/mL (ref 11–307)

## 2022-10-12 LAB — RETICULOCYTES
Immature Retic Fract: 19.7 % — ABNORMAL HIGH (ref 2.3–15.9)
RBC.: 3.42 MIL/uL — ABNORMAL LOW (ref 3.87–5.11)
Retic Count, Absolute: 126.9 10*3/uL (ref 19.0–186.0)
Retic Ct Pct: 3.7 % — ABNORMAL HIGH (ref 0.4–3.1)

## 2022-10-12 NOTE — Telephone Encounter (Signed)
Critical creatinine of  3.17 received from lab- md aware

## 2022-10-12 NOTE — Progress Notes (Signed)
Hematology and Oncology Follow Up Visit  [name] [id number] 1956-07-13 67 y.o. 10/12/2022   Principle Diagnosis:  Recurrent iron deficiency anemia secondary to ulcerative colitis Right cephalic/basilic vein thrombus - transiently positive lupus anticoagulant   Current Therapy:        IV iron as indicated                Interim History:  [name] is here today for follow-up. She is doing fairly well. She is now dealing with stage IV chronic kidney disease. She is getting IV fluids 3 times a week to avoid dehydration.  She notes some fatigue at times along with palpitations, dizziness and mild SOB with over exertion.  She is resting as needed.  No fever, chills, n/v, cough, rash, abdominal pain or changes in bowel or bladder habits.  She states that her gastric emptying study showed her emptying into her ileostomy in under an hour and a half.  No blood loss noted. No bruising or petechiae.  No swelling in her extremities. Neuropathy in her feet unchanged from baseline.  No falls or syncope reported.  Appetite and hydration have been good. Weight is 184 lbs.    ECOG Performance Status: 1 - Symptomatic but completely ambulatory  Medications:  Allergies as of 10/12/2022       Reactions   Sulfa Antibiotics Hives, Itching, Rash, Swelling   Morphine Other (See Comments)   "Gives me crazy dreams" (delusions, also)   Sulfonamide Derivatives Hives, Itching, Swelling        Medication List        Accurate as of October 12, 2022  1:21 PM. If you have any questions, ask your nurse or doctor.          acetaminophen 500 MG tablet Commonly known as: TYLENOL Take 1,000 mg by mouth at bedtime as needed (for pain/sleep).   allopurinol 300 MG tablet Commonly known as: ZYLOPRIM TAKE ONE TABLET BY MOUTH DAILY AT 9AM What changed: See the new instructions.   amLODipine 5 MG tablet Commonly known as: NORVASC Take 5 mg by mouth daily.   Benadryl Allergy 25 MG tablet Generic  drug: diphenhydrAMINE Take 25 mg by mouth at bedtime.   cholestyramine light 4 g packet Commonly known as: PREVALITE Take 1 packet (4 g total) by mouth 3 (three) times daily after meals.   doxycycline 100 MG tablet Commonly known as: VIBRA-TABS Take 1 tablet (100 mg total) by mouth 2 (two) times daily.   DULoxetine 60 MG capsule Commonly known as: CYMBALTA TAKE TWO CAPSULES ('120MG'$ ) BY MOUTH DAILY AT 9AM What changed: See the new instructions.   folic acid 1 MG tablet Commonly known as: FOLVITE Take 2 tablets (2 mg total) by mouth daily. What changed: how much to take   Gas-X Extra Strength 125 MG Caps Generic drug: Simethicone Take 125 mg by mouth at bedtime.   metoprolol succinate 100 MG 24 hr tablet Commonly known as: TOPROL-XL Take 50 mg by mouth See admin instructions. Take 50 mg by mouth in the morning and afternoon   OVER THE COUNTER MEDICATION Take 1 tablet by mouth See admin instructions. Nervive Nerve Health tablet- Take 1 tablet by mouth at bedtime   predniSONE 10 MG tablet Commonly known as: DELTASONE TAKE 3 TABLETS PO QD FOR 3 DAYS THEN TAKE 2 TABLETS PO QD FOR 3 DAYS THEN TAKE 1 TABLET PO QD FOR 3 DAYS THEN TAKE 1/2 TAB PO QD FOR 3 DAYS   TUMS  EXTRA STRENGTH 750 PO Take 2 tablets by mouth daily as needed (CHEW for heartburn).   Vitamin D (Ergocalciferol) 1.25 MG (50000 UNIT) Caps capsule Commonly known as: DRISDOL Take 50,000 Units by mouth every 30 (thirty) days.        Allergies:  Allergies  Allergen Reactions   Sulfa Antibiotics Hives, Itching, Rash and Swelling   Morphine Other (See Comments)    "Gives me crazy dreams" (delusions, also)   Sulfonamide Derivatives Hives, Itching and Swelling    Past Medical History, Surgical history, Social history, and Family History were reviewed and updated.  Review of Systems: All other 10 point review of systems is negative.   Physical Exam:  vitals were not taken for this visit.   Wt Readings from  Last 3 Encounters:  10/11/22 181 lb 6.4 oz (82.3 kg)  10/08/22 183 lb 9.6 oz (83.3 kg)  10/06/22 182 lb (82.6 kg)    Ocular: Sclerae unicteric, pupils equal, round and reactive to light Ear-nose-throat: Oropharynx clear, dentition fair Lymphatic: No cervical or supraclavicular adenopathy Lungs no rales or rhonchi, good excursion bilaterally Heart regular rate and rhythm, no murmur appreciated Abd soft, nontender, positive bowel sounds MSK no focal spinal tenderness, no joint edema Neuro: non-focal, well-oriented, appropriate affect Breasts: Deferred   Lab Results  Component Value Date   WBC 12.5 (H) 08/03/2022   HGB 10.8 (L) 08/03/2022   HCT 33.3 (L) 08/03/2022   MCV 103.2 (H) 08/03/2022   PLT 396.0 08/03/2022   Lab Results  Component Value Date   FERRITIN 194.6 07/15/2022   IRON 142 08/03/2022   TIBC 439.6 08/03/2022   UIBC 339 04/01/2022   IRONPCTSAT 32.3 08/03/2022   Lab Results  Component Value Date   RETICCTPCT 4.1 (H) 03/10/2022   RBC 3.23 (L) 08/03/2022   No results found for: "KPAFRELGTCHN", "LAMBDASER", "KAPLAMBRATIO" No results found for: "IGGSERUM", "IGA", "IGMSERUM" No results found for: "TOTALPROTELP", "ALBUMINELP", "A1GS", "A2GS", "BETS", "BETA2SER", "GAMS", "MSPIKE", "SPEI"   Chemistry      Component Value Date/Time   NA 137 08/03/2022 1219   NA 142 09/08/2017 0923   K 4.3 08/03/2022 1219   K 3.7 09/08/2017 0923   CL 110 08/03/2022 1219   CL 97 (L) 09/08/2017 0923   CO2 17 (L) 08/03/2022 1219   CO2 28 09/08/2017 0923   BUN 50 (H) 08/03/2022 1219   BUN 49 (H) 09/08/2017 0923   CREATININE 3.18 (H) 08/03/2022 1219   CREATININE 3.22 (HH) 05/11/2022 1056   CREATININE 3.1 (HH) 09/08/2017 0923      Component Value Date/Time   CALCIUM 8.8 08/03/2022 1219   CALCIUM 9.9 09/08/2017 0923   CALCIUM 8.9 04/12/2013 0851   ALKPHOS 109 08/03/2022 1219   ALKPHOS 89 (H) 09/08/2017 0923   AST 23 08/03/2022 1219   AST 22 05/11/2022 1056   ALT 35 08/03/2022  1219   ALT 21 05/11/2022 1056   ALT 22 09/08/2017 0923   BILITOT 0.4 08/03/2022 1219   BILITOT 0.3 05/11/2022 1056       Impression and Plan: [name] is a very pleasant 67 yo caucasian female with iron deficiency anemia secondary to intermittent GI blood loss/malabsorption with UC. 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  Iron studies are pending. We will replace if needed.  Follow-up in 4 months.    Lottie Dawson, NP 2/6/20241:21 PM

## 2022-10-13 ENCOUNTER — Ambulatory Visit (INDEPENDENT_AMBULATORY_CARE_PROVIDER_SITE_OTHER): Payer: Medicare Other | Admitting: *Deleted

## 2022-10-13 VITALS — BP 154/79 | HR 79 | Temp 96.8°F | Resp 16 | Ht 63.0 in | Wt 185.6 lb

## 2022-10-13 DIAGNOSIS — E860 Dehydration: Secondary | ICD-10-CM | POA: Diagnosis not present

## 2022-10-13 MED ORDER — SODIUM CHLORIDE 0.9 % IV BOLUS
1000.0000 mL | Freq: Once | INTRAVENOUS | Status: AC
  Administered 2022-10-13: 1000 mL via INTRAVENOUS

## 2022-10-13 NOTE — Progress Notes (Signed)
Diagnosis: Dehydration  Provider:  Marshell Garfinkel MD  Procedure: Infusion  IV Type: Peripheral, IV Location: R Forearm  Normal Saline, Dose: 1000 ml  Infusion Start Time: 0830 am  Infusion Stop Time: 0940 am  Post Infusion IV Care: Observation period completed and Peripheral IV Discontinued  Discharge: Condition: Good, Destination: Home . AVS Provided and AVS Declined  Performed by:  Oren Beckmann, RN

## 2022-10-14 ENCOUNTER — Encounter: Payer: Self-pay | Admitting: Family

## 2022-10-14 ENCOUNTER — Encounter: Payer: Self-pay | Admitting: Nephrology

## 2022-10-15 ENCOUNTER — Ambulatory Visit (INDEPENDENT_AMBULATORY_CARE_PROVIDER_SITE_OTHER): Payer: Medicare Other

## 2022-10-15 VITALS — BP 162/97 | HR 89 | Temp 97.8°F | Resp 18 | Ht 63.0 in | Wt 184.6 lb

## 2022-10-15 DIAGNOSIS — E860 Dehydration: Secondary | ICD-10-CM

## 2022-10-15 MED ORDER — SODIUM CHLORIDE 0.9 % IV BOLUS
1000.0000 mL | Freq: Once | INTRAVENOUS | Status: AC
  Administered 2022-10-15: 1000 mL via INTRAVENOUS
  Filled 2022-10-15: qty 1000

## 2022-10-15 NOTE — Progress Notes (Signed)
Diagnosis: Dehydration  Provider:  Marshell Garfinkel MD  Procedure: Infusion  IV Type: Peripheral, IV Location: L Upper Arm  Normal Saline, Dose: 1000 ml  Infusion Start Time: [id number]  Infusion Stop Time: 0950  Post Infusion IV Care: Peripheral IV Discontinued  Discharge: Condition: Good, Destination: Home . AVS Provided and AVS Declined  Performed by:  Cleophus Molt, RN

## 2022-10-18 ENCOUNTER — Ambulatory Visit (INDEPENDENT_AMBULATORY_CARE_PROVIDER_SITE_OTHER): Payer: Medicare Other

## 2022-10-18 VITALS — BP 156/89 | HR 86 | Temp 98.0°F | Resp 18 | Ht 63.0 in | Wt 185.2 lb

## 2022-10-18 DIAGNOSIS — E860 Dehydration: Secondary | ICD-10-CM | POA: Diagnosis not present

## 2022-10-18 MED ORDER — SODIUM CHLORIDE 0.9 % IV BOLUS
1000.0000 mL | Freq: Once | INTRAVENOUS | Status: AC
  Administered 2022-10-18: 1000 mL via INTRAVENOUS

## 2022-10-18 NOTE — Progress Notes (Signed)
Diagnosis: Dehydration  Provider:  Marshell Garfinkel MD  Procedure: Infusion  IV Type: Peripheral, IV Location: R Forearm  Normal Saline, Dose: 1000 ml  Infusion Start Time: [id number]  Infusion Stop Time: 0950  Post Infusion IV Care: Peripheral IV Discontinued  Discharge: Condition: Good, Destination: Home . AVS Provided  Performed by:  Cleophus Molt, RN

## 2022-10-20 ENCOUNTER — Ambulatory Visit (INDEPENDENT_AMBULATORY_CARE_PROVIDER_SITE_OTHER): Payer: Medicare Other

## 2022-10-20 VITALS — BP 161/95 | HR 83 | Temp 97.9°F | Resp 16 | Ht 63.0 in | Wt 186.6 lb

## 2022-10-20 DIAGNOSIS — E860 Dehydration: Secondary | ICD-10-CM | POA: Diagnosis not present

## 2022-10-20 MED ORDER — SODIUM CHLORIDE 0.9 % IV BOLUS
1000.0000 mL | Freq: Once | INTRAVENOUS | Status: AC
  Administered 2022-10-20: 1000 mL via INTRAVENOUS
  Filled 2022-10-20: qty 1000

## 2022-10-20 NOTE — Progress Notes (Signed)
Diagnosis: Dehydration  Provider:  Marshell Garfinkel MD  Procedure: Infusion  IV Type: Peripheral, IV Location: L Forearm  Normal Saline, Dose: 1000 ml  Infusion Start Time: 0910  Infusion Stop Time: 1012  Post Infusion IV Care: Peripheral IV Discontinued  Discharge: Condition: Good, Destination: Home . AVS Provided and AVS Declined  Performed by:  Paul Dykes, RN

## 2022-10-22 ENCOUNTER — Ambulatory Visit (INDEPENDENT_AMBULATORY_CARE_PROVIDER_SITE_OTHER): Payer: Medicare Other | Admitting: *Deleted

## 2022-10-22 VITALS — BP 181/104 | HR 103 | Temp 97.8°F | Resp 18 | Ht 63.0 in | Wt 188.4 lb

## 2022-10-22 DIAGNOSIS — E860 Dehydration: Secondary | ICD-10-CM

## 2022-10-22 MED ORDER — SODIUM CHLORIDE 0.9 % IV SOLN
1000.0000 mL | Freq: Once | INTRAVENOUS | Status: AC
  Administered 2022-10-22: 1000 mL via INTRAVENOUS

## 2022-10-22 NOTE — Progress Notes (Signed)
Diagnosis: Dehydration  Provider:  Marshell Garfinkel MD  Procedure: Infusion  IV Type: Peripheral, IV Location: L Hand  Normal Saline, Dose: 1000 ml  Infusion Start Time: [id number]  Infusion Stop Time: 0942  Post Infusion IV Care: Patient declined observation and Peripheral IV Discontinued  Discharge: Condition: Good, Destination: Home . AVS Provided  Performed by:  Baxter Hire, RN

## 2022-10-25 ENCOUNTER — Ambulatory Visit (INDEPENDENT_AMBULATORY_CARE_PROVIDER_SITE_OTHER): Payer: Medicare Other | Admitting: *Deleted

## 2022-10-25 VITALS — BP 134/84 | HR 89 | Temp 98.6°F | Resp 16 | Ht 63.0 in | Wt 187.2 lb

## 2022-10-25 DIAGNOSIS — E860 Dehydration: Secondary | ICD-10-CM | POA: Diagnosis not present

## 2022-10-25 MED ORDER — SODIUM CHLORIDE 0.9 % IV BOLUS
1000.0000 mL | Freq: Once | INTRAVENOUS | Status: AC
  Administered 2022-10-25: 1000 mL via INTRAVENOUS
  Filled 2022-10-25: qty 1000

## 2022-10-25 NOTE — Progress Notes (Signed)
Diagnosis: Dehydration  Provider:  Marshell Garfinkel MD  Procedure: Infusion  IV Type: Peripheral, IV Location: L Antecubital  Normal Saline, Dose: 1000 ml  Infusion Start Time: 0827 am  Infusion Stop Time: [id number]  Post Infusion IV Care: Observation period completed and Peripheral IV Discontinued  Discharge: Condition: Good, Destination: Home . AVS Provided and AVS Declined  Performed by:  Oren Beckmann, RN

## 2022-10-26 ENCOUNTER — Encounter: Payer: Self-pay | Admitting: Nephrology

## 2022-10-26 ENCOUNTER — Encounter: Payer: Self-pay | Admitting: Gastroenterology

## 2022-10-26 ENCOUNTER — Ambulatory Visit: Payer: Medicare Other | Admitting: Gastroenterology

## 2022-10-26 ENCOUNTER — Encounter: Payer: Self-pay | Admitting: Family

## 2022-10-26 VITALS — BP 132/84 | HR 87 | Ht 63.0 in | Wt 189.0 lb

## 2022-10-26 DIAGNOSIS — R198 Other specified symptoms and signs involving the digestive system and abdomen: Secondary | ICD-10-CM | POA: Diagnosis not present

## 2022-10-26 DIAGNOSIS — Z932 Ileostomy status: Secondary | ICD-10-CM

## 2022-10-26 DIAGNOSIS — K50818 Crohn's disease of both small and large intestine with other complication: Secondary | ICD-10-CM | POA: Diagnosis not present

## 2022-10-26 NOTE — Progress Notes (Signed)
HPI :  IBD history: 67 year old white female with IBD ( Crohn's) since 65.  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 She was never able to be put in remission prior to colectomy. She was only exposed to steroids prior to her surgery. Post-operatively, she was on Remicade for 3-4 years but did not help too much. She had been on 6MP remotely but did not work for her. She was on Humira but also did not put her into remission and she developed high antibody titers and it was stopped. Most recently on Entyvio and low dose 6MP. 6MP was stopped due to worsening anemia.  She reports multiple courses of antibiotics with some benefit historically.  She has been on budesonider with ? Relief. Father and daughter have UC.  She has seen Jeanne Ivan and Dr. 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 Most recently saw Dr. Renelda Mom at [facility] and had diverting ileostomy April 23 2021. Returned for J pouch excision Jan 2023.   SINCE THE LAST VISIT   67 year old female here for a follow-up visit.  Recall that she has had ongoing increased ostomy output associate with nausea and vomiting, has more recently been inquiring IV fluid infusions as an outpatient to prevent dehydration.  She has chronic kidney disease, her nephrologist has been trying to keep her off dialysis to maintain kidney function however this past fall she had a few admissions for dehydration.  She has been receiving IV fluid infusions with 1 to 2 L 2-3 times per week to keep her out of the hospital.  She states this is working really well and keeping her hydrated however, we discussed this is not a great long-term solution.  She states every time she urinates she tends to empty her bag.  She empties it 4-5 times overnight and multiple times during the day, she does never let it get full enough because  once full it can lead to leakage and accidents.  She has a hard time quantifying how much output she has but she states using Imodium and cholestyramine has thickened it up and tends to be not as bad in the morning as it used to be.  She is only taking 2 Imodium at night, she states she forgets to take it during the day.  She denies any blood in her ostomy output.  She denies any ongoing abdominal pains that bother her other than gas from time to time.  She is been caring for her mother who is 41 years old which provides additional stress to her life.  She has had a CT scan this past October showing no active Crohn's disease, she had a normal inflammatory markers previously as well as a normal fecal calprotectin a few months ago. She had an ileoscopy that was normal  in November, no active CD.  Generally she is feeling pretty well today without any complaints but we discussed more long-term solution in regards to her high ostomy output.  We had discussed using octreotide in the past but she has never tried that yet, unclear if it will be covered by insurance or not.    Prior workup: Pouchoscopy 03/31/17 - anal fissure, mild anal canal stenosis, focal ulceration of pouch and more proximal ileum but improved compared to previous   Pouchoscopy 01/21/20 -  Preparation of the colon was poor. -  A suspected small perianal fistulous tract and anal canal stenosis found on digital rectal exam. - The ileal pouch has an angulated turn with very focal ulcerations, otherwise no inflammatory changes noted. biopsies obtained - Small area of nodularity along anal canal - biopsied to rule out AIN / condyloma - Suspected fistal of the distal pouch just proximal to anal canal Overall, only one focal ulceration noted and no other inflammatory changes seen. Incidentally noted suspected new fistula   EGD 01/21/20 -  - A 3 cm hiatal hernia was present. - LA Grade A esophagitis was found 33 cm from the incisors - mild. -  The exam of the esophagus was otherwise normal. No Barrett's esophagus. - Multiple small sessile polyps were found in the gastric fundus and in the gastric body, benign appearing, likely fundic gland polyps, a cluster was noted at the distal end of the hernia sac. Biopsies were taken with a cold forceps for histology. - The exam of the stomach was otherwise normal. - The duodenal bulb and second portion of the duodenum were normal.   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 Cellulitis extends superiorly throughout the posterior subcutaneous fat, adjacent to the gluteal and posterior paraspinal muscles. No evidence of abscess.     MRE 06/25/20 - IMPRESSION: 1. Persistent inflammatory changes near the upper gluteal cleft bilaterally between the gluteus maximus muscles. No discrete drainable soft tissue abscess or intramuscular involvement. 2. No MR findings for acute inflammatory small-bowel disease. However, study is limited without contrast.   CT scan abdomen/ pelvis without contrast 05/01/21: IMPRESSION: 1. Findings worrisome for an area of focal bowel perforation versus anastomosis leak within the anterior aspect of the mid abdomen, with extravasation of air and contrast into the adjacent surgical incision site. 2. Postoperative changes within subsequent right lower quadrant ileostomy. 3. Aortic atherosclerosis.   CT scan abdomen / pelvis without contrast 05/14/21: IMPRESSION: Status post proctocolectomy. Ileostomy is noted in right lower quadrant. There is no evidence of bowel obstruction seen at this time.   No definite acute abnormality is seen in the abdomen or pelvis at this time.     CT abdomen / pelvis without contrast 09/22/21: IMPRESSION: 1. Patient is status post colectomy with right lower quadrant ileostomy.  Small bowel loops in the upper abdomen and stomach appeared dilated concerning for small bowel obstruction. Transition point is not definitive, but may be at the level of the ostomy. No free air or pneumatosis. 2. There is wall thickening of the distal esophagus and proximal stomach worrisome for gastritis and esophagitis. 3. Small amount of air in the pelvis is unchanged from prior, likely postoperative. 4. Nonobstructing left renal calculus     CT abdomen / pelvis without contrast 06/29/22: IMPRESSION: Status post colectomy with ileostomy seen in right lower quadrant. No abnormal bowel dilatation or inflammation is noted. No acute abnormality seen in the abdomen or pelvis.     Ileoscopy 07/20/22: normal   Small bowel follow through 07/21/22: IMPRESSION: Right mid abdominal ileostomy. No findings to indicate disease progression.     ESR and CRP normal on 07/21/22  Fecal calprotectin 07/19/22 - 5   Past Medical History:  Diagnosis Date   Abdominal pain    Hx   Allergy    Anal stenosis    Anemia    Anxiety    Arthritis  Asthma    patient does not have inhaler   Blood in stool    Hx   Blood in urine    Hx   Blood transfusion without reported diagnosis    Cataract    CKD (chronic kidney disease) stage 3, GFR 30-59 ml/min (HCC)    Crohn's colitis, other complication (HCC)    De Quervain's tenosynovitis    Depression    Difficulty urinating    Hx   Easy bruising    Esophagitis    Fainting    History - resolved - due to dehydration   Fatigue    Hx   Fibroid    Hx   Gastric polyp    GERD (gastroesophageal reflux disease)    Hearing loss    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   Hemorrhoids, external    Hemorrhoids, internal    Herpes, genital    vaginal treated 07/05/12 and pt states is resolved   History of cervical dysplasia    History of small bowel obstruction    Hyperlipidemia    currently no meds   Hyperparathyroidism    Hypertension     Hypokalemia    Hx   IBD (inflammatory bowel disease)    initially colectomy for suspected UC, 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   Obesity    Ovarian cyst    Poor dental hygiene    Pulmonary nodule, right    right upper lobe   Rectal bleeding    Hx   Rectal pain    Hx   Renal insufficiency    CKD - stage 3   RLS (restless legs syndrome)    no meds   Tooth infection 11/2016   right low   Ulcerative colitis    Visual disturbance    wears glasses   Weakness generalized    Hx - patient denies generalized weakness   Wears dentures    upper only     Past Surgical History:  Procedure Laterality Date   ANAL DILATION     BIOPSY  07/20/2022   Procedure: BIOPSY;  Surgeon: Ladene Artist, MD;  Location: [facility];  Service: Gastroenterology;;   CERVICAL BIOPSY  W/ LOOP ELECTRODE EXCISION     CHOLECYSTECTOMY     COLONOSCOPY     [name]   fatty tumor removed from back     X 2   HEMORRHOID SURGERY     ILEOSCOPY N/A 07/20/2022   Procedure: ILEOSCOPY THROUGH STOMA;  Surgeon: Ladene Artist, MD;  Location: [facility];  Service: Gastroenterology;  Laterality: N/A;   ILEOSTOMY CLOSURE     RESTORATIVE PROCTOCOLECTOMY     with insertion of ileoanal J Pouch with loop ileostomy   SHOULDER ARTHROSCOPY WITH CAPSULORRHAPHY Left 06/14/2019   Procedure: LEFT SHOULDER ARTHRSCOPIC REPAIR OF BONY BANKART FRACTURE;  Surgeon: Tania Ade, MD;  Location: [facility];  Service: Orthopedics;  Laterality: Left;  REQUEST 16 MINUTE   SIGMOIDOSCOPY     TOTAL ABDOMINAL HYSTERECTOMY  1998   TAH/LSO   TUBAL LIGATION     UPPER GASTROINTESTINAL ENDOSCOPY     [name]   Family History  Problem Relation Age of Onset   Hypertension Mother    Heart disease Mother        s/p pci   Ulcerative colitis Father    Hypertension Father    Heart attack Father    Diabetes Sister    Cancer Sister  uterine   Pulmonary fibrosis Sister    Ulcerative colitis Daughter    Cancer  Maternal Uncle        LUNG   Irritable bowel syndrome Other        grandchildren   Colon cancer Neg Hx    Esophageal cancer Neg Hx    Stomach cancer Neg Hx    Rectal cancer Neg Hx    Social History   Tobacco Use   Smoking status: Former    Packs/day: 1.00    Years: 4.00    Total pack years: 4.00    Types: Cigarettes    Quit date: 05/21/1979    Years since quitting: 43.4   Smokeless tobacco: Never  Vaping Use   Vaping Use: Never used  Substance Use Topics   Alcohol use: No    Alcohol/week: 0.0 standard drinks of alcohol   Drug use: No   Current Outpatient Medications  Medication Sig Dispense Refill   acetaminophen (TYLENOL) 500 MG tablet Take 1,000 mg by mouth at bedtime as needed (for pain/sleep).     allopurinol (ZYLOPRIM) 300 MG tablet TAKE ONE TABLET BY MOUTH DAILY AT 9AM (Patient taking differently: Take 150 mg by mouth at bedtime.) 90 tablet 1   amLODipine (NORVASC) 5 MG tablet Take 5 mg by mouth daily.     Calcium Carbonate Antacid (TUMS EXTRA STRENGTH 750 PO) Take 2 tablets by mouth daily as needed (CHEW for heartburn).     cholestyramine light (PREVALITE) 4 g packet Take 1 packet (4 g total) by mouth 3 (three) times daily after meals. 42 packet 1   diphenhydrAMINE (BENADRYL ALLERGY) 25 MG tablet Take 25 mg by mouth at bedtime.     doxycycline (VIBRA-TABS) 100 MG tablet Take 1 tablet (100 mg total) by mouth 2 (two) times daily. 20 tablet 0   DULoxetine (CYMBALTA) 60 MG capsule TAKE TWO CAPSULES (120MG) BY MOUTH DAILY AT 9AM (Patient taking differently: Take 120 mg by mouth daily.) [id number] capsule 1   folic acid (FOLVITE) 1 MG tablet Take 2 tablets (2 mg total) by mouth daily. (Patient taking differently: Take 1 mg by mouth daily.) 180 tablet 3   GAS-X EXTRA STRENGTH 125 MG CAPS Take 125 mg by mouth at bedtime.     metoprolol succinate (TOPROL-XL) 100 MG 24 hr tablet Take 50 mg by mouth See admin instructions. Take 50 mg by mouth in the morning and afternoon     OVER THE  COUNTER MEDICATION Take 1 tablet by mouth See admin instructions. Nervive Nerve Health tablet- Take 1 tablet by mouth at bedtime     Vitamin D, Ergocalciferol, (DRISDOL) 1.25 MG (50000 UNIT) CAPS capsule Take 50,000 Units by mouth every 30 (thirty) days.     No current facility-administered medications for this visit.   Facility-Administered Medications Ordered in Other Visits  Medication Dose Route Frequency Provider Last Rate Last Admin   sodium chloride 0.9 % bolus 1,000 mL  1,000 mL Intravenous Once Znya Albino, Carlota Raspberry, MD       Allergies  Allergen Reactions   Sulfa Antibiotics Hives, Itching, Rash and Swelling   Morphine Other (See Comments)    "Gives me crazy dreams" (delusions, also)   Sulfonamide Derivatives Hives, Itching and Swelling     Review of Systems: All systems reviewed and negative except where noted in HPI.   Lab Results  Component Value Date   WBC 11.8 (H) 10/12/2022   HGB 11.3 (L) 10/12/2022   HCT 35.9 (L) 10/12/2022  MCV 105.0 (H) 10/12/2022   PLT 335 10/12/2022    Lab Results  Component Value Date   CREATININE 3.18 (HH) 10/12/2022   BUN 57 (H) 10/12/2022   NA 140 10/12/2022   K 3.9 10/12/2022   CL 109 10/12/2022   CO2 20 (L) 10/12/2022    Lab Results  Component Value Date   ALT 17 10/12/2022   AST 17 10/12/2022   ALKPHOS 110 10/12/2022   BILITOT 0.4 10/12/2022     Physical Exam: BP 132/84   Pulse 87   Ht 5' 3"$  (1.6 m)   Wt 189 lb (85.7 kg)   SpO2 99%   BMI 33.48 kg/m  Constitutional: Pleasant,well-developed, female in no acute distress. Neurological: Alert and oriented to person place and time. Skin: Skin is warm and dry. No rashes noted. Psychiatric: Normal mood and affect. Behavior is normal.   ASSESSMENT: 67 y.o. female here for assessment of the following  1. High output ileostomy ([place])   2. Crohn's disease of both small and large intestine with other complication ([place])    History of Crohn's disease status post  colectomy, with J-pouch excision, now with permanent ileostomy as she was having significant incontinence previously.  She has had ongoing issues with high ostomy output leading to dehydration. Prior workup for this a few months ago with CT scan, labs, fecal calprotectin, ileoscopy showed her Crohn's is not active, no inflammation.   Currently on Imodium and cholestyramine and this has provided benefit to her, however has not resolved the issue.  She has been relying on IV infusions multiple days per week to prevent dehydration and hospitalization.  She definitely has room to go on Imodium and recommend she increase the dosing of this, she will take it during the morning and midday as well.  Otherwise discussed some options with her.  I would like to see if we can get authorization to try octreotide Depot which can be effective in reducing high ostomy output.  I discussed risks of this with her.  She has a history of cholecystectomy, so gallstone risk is low.  There is slight increased risk for cardiac arrhythmias, she has no history of this known.  After discussion of it she wishes to try it if approved by insurance.  Will get back to her about this and where she can get it injected (will order through infusion center), otherwise continue her regimen and increase Imodium.  Her nephrologist is going to continue with IV fluid infusions to prevent dehydration in the interim   PLAN: - increase immodium dosing to 2 tabs q AM and q PM - continue cholestyramine, has helped to bulk stools - will order and attempt to get octreotide depot 53m IM q month approved - trial to reduce her output. Counseled on risks, she understands - continue IVF infusions per nephrology as long as they are needed, has kept her out of the hospital in recent months   SJolly Mango MD [facility]

## 2022-10-27 ENCOUNTER — Telehealth: Payer: Self-pay | Admitting: Pharmacy Technician

## 2022-10-27 ENCOUNTER — Ambulatory Visit (INDEPENDENT_AMBULATORY_CARE_PROVIDER_SITE_OTHER): Payer: Medicare Other | Admitting: *Deleted

## 2022-10-27 VITALS — BP 160/89 | HR 82 | Temp 97.2°F | Resp 16 | Ht 63.0 in | Wt 188.2 lb

## 2022-10-27 DIAGNOSIS — E860 Dehydration: Secondary | ICD-10-CM

## 2022-10-27 MED ORDER — SODIUM CHLORIDE 0.9 % IV SOLN
1000.0000 mL | Freq: Once | INTRAVENOUS | Status: AC
  Administered 2022-10-27: 1000 mL via INTRAVENOUS

## 2022-10-27 NOTE — Telephone Encounter (Signed)
Wonderful. Thank you.

## 2022-10-27 NOTE — Progress Notes (Signed)
Diagnosis: Dehydration  Provider:  Marshell Garfinkel MD  Procedure: Infusion  IV Type: Peripheral, IV Location: L Antecubital  Normal Saline, Dose: 1000 ml  Infusion Start Time: 0831 am  Infusion Stop Time: [id number] am  Post Infusion IV Care: Observation period completed and Peripheral IV Discontinued  Discharge: Condition: Good, Destination: Home . AVS Provided and AVS Declined  Performed by:  Oren Beckmann, RN

## 2022-10-27 NOTE — Telephone Encounter (Signed)
Dr. Havery Moros, Juluis Rainier note:  Auth Submission: NO AUTH NEEDED Payer: [facility] MEDICARE Medication & CPT/J Code(s) submitted: Sandostatin (Octreotide LAR) [id number] Route of submission (phone, fax, portal): PORTAL Phone # Fax # Auth type: Buy/Bill Units/visits requested: 12 Reference number: [id number] Approval from: 10/27/22 to 10/28/23    Patient will be scheduled as soon as possible

## 2022-10-27 NOTE — Telephone Encounter (Signed)
No auth was required, but I requested courtesy review with Dx code 64.9 and it was approved. Would you like for me to re-submit or will code D64.9 be sufficient.

## 2022-10-28 ENCOUNTER — Encounter: Payer: Self-pay | Admitting: Gastroenterology

## 2022-10-28 ENCOUNTER — Encounter: Payer: Self-pay | Admitting: Family

## 2022-10-28 ENCOUNTER — Encounter: Payer: Self-pay | Admitting: Nephrology

## 2022-10-29 ENCOUNTER — Ambulatory Visit (INDEPENDENT_AMBULATORY_CARE_PROVIDER_SITE_OTHER): Payer: Medicare Other | Admitting: *Deleted

## 2022-10-29 VITALS — BP 131/84 | HR 96 | Temp 97.1°F | Resp 16 | Ht 63.0 in | Wt 189.0 lb

## 2022-10-29 DIAGNOSIS — E860 Dehydration: Secondary | ICD-10-CM | POA: Diagnosis not present

## 2022-10-29 MED ORDER — SODIUM CHLORIDE 0.9 % IV BOLUS
1000.0000 mL | Freq: Once | INTRAVENOUS | Status: AC
  Administered 2022-10-29: 1000 mL via INTRAVENOUS
  Filled 2022-10-29: qty 1000

## 2022-10-29 NOTE — Progress Notes (Signed)
Diagnosis: Dehydration  Provider:  Marshell Garfinkel MD  Procedure: Infusion  IV Type: Peripheral, IV Location: L Forearm  Normal Saline, Dose: 1000 ml  Infusion Start Time: 0829 am  Infusion Stop Time: 0933 am  Post Infusion IV Care: Observation period completed and Peripheral IV Discontinued  Discharge: Condition: Good, Destination: Home . AVS Provided and AVS Declined  Performed by:  Oren Beckmann, RN

## 2022-11-01 ENCOUNTER — Ambulatory Visit (INDEPENDENT_AMBULATORY_CARE_PROVIDER_SITE_OTHER): Payer: Medicare Other

## 2022-11-01 VITALS — BP 137/88 | HR 94 | Temp 97.9°F | Resp 20 | Ht 63.0 in | Wt 187.8 lb

## 2022-11-01 DIAGNOSIS — E860 Dehydration: Secondary | ICD-10-CM | POA: Diagnosis not present

## 2022-11-01 MED ORDER — SODIUM CHLORIDE 0.9 % IV BOLUS
1000.0000 mL | Freq: Once | INTRAVENOUS | Status: AC
  Administered 2022-11-01: 1000 mL via INTRAVENOUS
  Filled 2022-11-01: qty 1000

## 2022-11-01 NOTE — Progress Notes (Signed)
Diagnosis: Dehydration  Provider:  Marshell Garfinkel MD  Procedure: Infusion  IV Type: Peripheral, IV Location: R Hand  Normal Saline, Dose: 1000 ml  Infusion Start Time: [id number]  Infusion Stop Time: [id number]  Post Infusion IV Care: Peripheral IV Discontinued  Discharge: Condition: Good, Destination: Home . AVS Provided and AVS Declined  Performed by:  Cleophus Molt, RN

## 2022-11-03 ENCOUNTER — Ambulatory Visit (INDEPENDENT_AMBULATORY_CARE_PROVIDER_SITE_OTHER): Payer: Medicare Other | Admitting: *Deleted

## 2022-11-03 VITALS — BP 129/85 | HR 89 | Temp 97.8°F | Resp 16 | Ht 63.0 in | Wt 190.6 lb

## 2022-11-03 DIAGNOSIS — E860 Dehydration: Secondary | ICD-10-CM | POA: Diagnosis not present

## 2022-11-03 MED ORDER — SODIUM CHLORIDE 0.9 % IV BOLUS
1000.0000 mL | Freq: Once | INTRAVENOUS | Status: AC
  Administered 2022-11-03: 1000 mL via INTRAVENOUS
  Filled 2022-11-03: qty 1000

## 2022-11-03 NOTE — Progress Notes (Signed)
Diagnosis: Dehydration  Provider:  Marshell Garfinkel MD  Procedure: Infusion  IV Type: Peripheral, IV Location: L Antecubital  Normal Saline, Dose: 1000 ml  Infusion Start Time: [id number] am  Infusion Stop Time: 0952 am  Post Infusion IV Care: Observation period completed and Peripheral IV Discontinued  Discharge: Condition: Good, Destination: Home . AVS Provided and AVS Declined  Performed by:  Oren Beckmann, RN

## 2022-11-05 ENCOUNTER — Ambulatory Visit (INDEPENDENT_AMBULATORY_CARE_PROVIDER_SITE_OTHER): Payer: Medicare Other | Admitting: *Deleted

## 2022-11-05 VITALS — BP 123/81 | HR 90 | Temp 97.7°F | Resp 16 | Ht 63.0 in | Wt 191.6 lb

## 2022-11-05 DIAGNOSIS — E860 Dehydration: Secondary | ICD-10-CM

## 2022-11-05 MED ORDER — SODIUM CHLORIDE 0.9 % IV BOLUS
1000.0000 mL | Freq: Once | INTRAVENOUS | Status: AC
  Administered 2022-11-05: 1000 mL via INTRAVENOUS
  Filled 2022-11-05: qty 1000

## 2022-11-05 NOTE — Progress Notes (Signed)
Diagnosis: Dehydration  Provider:  Marshell Garfinkel MD  Procedure: Infusion  IV Type: Peripheral, IV Location: L Antecubital  Normal Saline, Dose: 1000 ml  Infusion Start Time: [id number] am  Infusion Stop Time: [id number] am  Post Infusion IV Care: Observation period completed and Peripheral IV Discontinued  Discharge: Condition: Good, Destination: Home . AVS Provided and AVS Declined  Performed by:  Oren Beckmann, RN

## 2022-11-08 ENCOUNTER — Ambulatory Visit (INDEPENDENT_AMBULATORY_CARE_PROVIDER_SITE_OTHER): Payer: Medicare Other

## 2022-11-08 VITALS — BP 132/75 | HR 88 | Temp 98.0°F | Resp 16 | Ht 63.0 in | Wt 189.0 lb

## 2022-11-08 DIAGNOSIS — E860 Dehydration: Secondary | ICD-10-CM

## 2022-11-08 MED ORDER — SODIUM CHLORIDE 0.9 % IV BOLUS
1000.0000 mL | Freq: Once | INTRAVENOUS | Status: AC
  Administered 2022-11-08: 1000 mL via INTRAVENOUS
  Filled 2022-11-08: qty 1000

## 2022-11-08 NOTE — Progress Notes (Signed)
Diagnosis: Dehydration  Provider:  Marshell Garfinkel MD  Procedure: Infusion  IV Type: Peripheral, IV Location: L Antecubital  Normal Saline, Dose: 1000 ml  Infusion Start Time: 0824  Infusion Stop Time: 0927  Post Infusion IV Care: Peripheral IV Discontinued  Discharge: Condition: Good, Destination: Home . AVS Provided and AVS Declined  Performed by:  Arnoldo Morale, RN

## 2022-11-10 ENCOUNTER — Ambulatory Visit (INDEPENDENT_AMBULATORY_CARE_PROVIDER_SITE_OTHER): Payer: Medicare Other

## 2022-11-10 VITALS — BP 141/90 | HR 100 | Temp 98.0°F | Resp 20 | Ht 63.0 in | Wt 191.6 lb

## 2022-11-10 DIAGNOSIS — E860 Dehydration: Secondary | ICD-10-CM

## 2022-11-10 DIAGNOSIS — K50819 Crohn's disease of both small and large intestine with unspecified complications: Secondary | ICD-10-CM | POA: Diagnosis not present

## 2022-11-10 DIAGNOSIS — Z932 Ileostomy status: Secondary | ICD-10-CM

## 2022-11-10 DIAGNOSIS — D649 Anemia, unspecified: Secondary | ICD-10-CM

## 2022-11-10 DIAGNOSIS — R198 Other specified symptoms and signs involving the digestive system and abdomen: Secondary | ICD-10-CM | POA: Diagnosis not present

## 2022-11-10 MED ORDER — OCTREOTIDE ACETATE 20 MG IM KIT
20.0000 mg | PACK | Freq: Once | INTRAMUSCULAR | Status: AC
  Administered 2022-11-10: 20 mg via INTRAMUSCULAR

## 2022-11-10 MED ORDER — SODIUM CHLORIDE 0.9 % IV BOLUS
1000.0000 mL | Freq: Once | INTRAVENOUS | Status: AC
  Administered 2022-11-10: 1000 mL via INTRAVENOUS
  Filled 2022-11-10: qty 1000

## 2022-11-10 NOTE — Progress Notes (Signed)
Diagnosis: Crohn's Disease  Provider:  Marshell Garfinkel MD  Procedure: Injection  OCTREOTIDE , Dose: 20 mg, Site: intramuscular, Number of injections: 1  Post Care: Observation period completed  Discharge: Condition: Good, Destination: Home . AVS Declined  Performed by:  Adelina Mings, LPN      Diagnosis: Dehydration  Provider:  Marshell Garfinkel MD  Procedure: Infusion  IV Type: Peripheral, IV Location: R Hand  Normal Saline, Dose: 1000 ml  Infusion Start Time: [id number]  Infusion Stop Time: [id number]  Post Infusion IV Care: Observation period completed  Discharge: Condition: Good, Destination: Home . AVS Declined  Performed by:  Adelina Mings, LPN

## 2022-11-11 NOTE — Progress Notes (Signed)
Diagnosis: Crohn's Disease  Provider:  Marshell Garfinkel MD  Procedure: Injection  OCTREOTIDE , Dose: 20 mg, Site: intramuscular, Number of injections: 1  Post Care: Observation period completed  Discharge: Condition: Good, Destination: Home . AVS Declined  Performed by:  Jonelle Sidle, RN      Diagnosis: Dehydration  Provider:  Marshell Garfinkel MD  Procedure: Infusion  IV Type: Peripheral, IV Location: R Hand  Normal Saline, Dose: 1000 ml  Infusion Start Time: [id number]  Infusion Stop Time: [id number]  Post Infusion IV Care: Observation period completed  Discharge: Condition: Good, Destination: Home . AVS Declined  Performed by:  Jonelle Sidle, RN

## 2022-11-12 ENCOUNTER — Ambulatory Visit (INDEPENDENT_AMBULATORY_CARE_PROVIDER_SITE_OTHER): Payer: Medicare Other

## 2022-11-12 VITALS — BP 132/80 | HR 83 | Temp 97.7°F | Resp 18 | Ht 62.99 in | Wt 194.2 lb

## 2022-11-12 DIAGNOSIS — E860 Dehydration: Secondary | ICD-10-CM | POA: Diagnosis not present

## 2022-11-12 MED ORDER — SODIUM CHLORIDE 0.9 % IV BOLUS
1000.0000 mL | Freq: Once | INTRAVENOUS | Status: AC
  Administered 2022-11-12: 1000 mL via INTRAVENOUS
  Filled 2022-11-12: qty 1000

## 2022-11-12 NOTE — Addendum Note (Signed)
Addended by: Carin Primrose on: 11/12/2022 03:41 PM   Modules accepted: Orders

## 2022-11-12 NOTE — Progress Notes (Signed)
Diagnosis: Dehydration  Provider:  Marshell Garfinkel MD  Procedure: Infusion  IV Type: Peripheral, IV Location: L Forearm  Normal Saline, Dose: 1000 ml  Infusion Start Time: 0945  Infusion Stop Time: [id number]  Post Infusion IV Care: Peripheral IV Discontinued  Discharge: Condition: Good, Destination: Home . AVS DeclinedD Performed by:  Fraser Din [place], RN

## 2022-11-17 DIAGNOSIS — M109 Gout, unspecified: Secondary | ICD-10-CM | POA: Diagnosis not present

## 2022-11-17 DIAGNOSIS — I129 Hypertensive chronic kidney disease with stage 1 through stage 4 chronic kidney disease, or unspecified chronic kidney disease: Secondary | ICD-10-CM | POA: Diagnosis not present

## 2022-11-17 DIAGNOSIS — E876 Hypokalemia: Secondary | ICD-10-CM | POA: Diagnosis not present

## 2022-11-17 DIAGNOSIS — N2581 Secondary hyperparathyroidism of renal origin: Secondary | ICD-10-CM | POA: Diagnosis not present

## 2022-11-17 DIAGNOSIS — K5090 Crohn's disease, unspecified, without complications: Secondary | ICD-10-CM | POA: Diagnosis not present

## 2022-11-17 DIAGNOSIS — E88819 Insulin resistance, unspecified: Secondary | ICD-10-CM | POA: Diagnosis not present

## 2022-11-17 DIAGNOSIS — N184 Chronic kidney disease, stage 4 (severe): Secondary | ICD-10-CM | POA: Diagnosis not present

## 2022-11-17 DIAGNOSIS — E871 Hypo-osmolality and hyponatremia: Secondary | ICD-10-CM | POA: Diagnosis not present

## 2022-11-17 DIAGNOSIS — D509 Iron deficiency anemia, unspecified: Secondary | ICD-10-CM | POA: Diagnosis not present

## 2022-11-17 DIAGNOSIS — R768 Other specified abnormal immunological findings in serum: Secondary | ICD-10-CM | POA: Diagnosis not present

## 2022-11-17 DIAGNOSIS — N390 Urinary tract infection, site not specified: Secondary | ICD-10-CM | POA: Diagnosis not present

## 2022-11-17 DIAGNOSIS — D631 Anemia in chronic kidney disease: Secondary | ICD-10-CM | POA: Diagnosis not present

## 2022-11-17 DIAGNOSIS — N179 Acute kidney failure, unspecified: Secondary | ICD-10-CM | POA: Diagnosis not present

## 2022-11-19 LAB — LAB REPORT - SCANNED
Creatinine, Random Urine: 130.2
Protein/Creatinine Ratio: 1134
Total Protein, Urine: 147.6

## 2022-12-01 ENCOUNTER — Other Ambulatory Visit: Payer: Self-pay

## 2022-12-08 ENCOUNTER — Ambulatory Visit (INDEPENDENT_AMBULATORY_CARE_PROVIDER_SITE_OTHER): Payer: Medicare Other

## 2022-12-08 VITALS — BP 136/85 | HR 91 | Temp 98.0°F | Resp 16 | Ht 62.0 in | Wt 191.0 lb

## 2022-12-08 DIAGNOSIS — R198 Other specified symptoms and signs involving the digestive system and abdomen: Secondary | ICD-10-CM | POA: Diagnosis not present

## 2022-12-08 DIAGNOSIS — D649 Anemia, unspecified: Secondary | ICD-10-CM

## 2022-12-08 DIAGNOSIS — Z932 Ileostomy status: Secondary | ICD-10-CM | POA: Diagnosis not present

## 2022-12-08 DIAGNOSIS — K50819 Crohn's disease of both small and large intestine with unspecified complications: Secondary | ICD-10-CM | POA: Diagnosis not present

## 2022-12-08 MED ORDER — OCTREOTIDE ACETATE 20 MG IM KIT
20.0000 mg | PACK | Freq: Once | INTRAMUSCULAR | Status: AC
  Administered 2022-12-08: 20 mg via INTRAMUSCULAR

## 2022-12-08 NOTE — Progress Notes (Signed)
Diagnosis: Crohn's Disease  Provider:  Marshell Garfinkel MD  Procedure: Injection  Octreotide, Dose: 20 mg, Site: intramuscular, Number of injections: 1  Post Care:  n/a  Discharge: Condition: Good, Destination: Home . AVS Provided  Performed by:  Adelina Mings, LPN

## 2022-12-09 ENCOUNTER — Other Ambulatory Visit: Payer: Self-pay | Admitting: Family Medicine

## 2022-12-09 DIAGNOSIS — F322 Major depressive disorder, single episode, severe without psychotic features: Secondary | ICD-10-CM

## 2022-12-10 DIAGNOSIS — L24B3 Irritant contact dermatitis related to fecal or urinary stoma or fistula: Secondary | ICD-10-CM | POA: Diagnosis not present

## 2022-12-10 DIAGNOSIS — Z932 Ileostomy status: Secondary | ICD-10-CM | POA: Diagnosis not present

## 2022-12-20 ENCOUNTER — Encounter: Payer: Self-pay | Admitting: *Deleted

## 2022-12-29 DIAGNOSIS — N184 Chronic kidney disease, stage 4 (severe): Secondary | ICD-10-CM | POA: Diagnosis not present

## 2023-01-05 ENCOUNTER — Ambulatory Visit (INDEPENDENT_AMBULATORY_CARE_PROVIDER_SITE_OTHER): Payer: Medicare Other

## 2023-01-05 VITALS — BP 93/66 | HR 96 | Temp 97.7°F | Resp 18 | Ht 63.0 in | Wt 190.4 lb

## 2023-01-05 DIAGNOSIS — R198 Other specified symptoms and signs involving the digestive system and abdomen: Secondary | ICD-10-CM

## 2023-01-05 DIAGNOSIS — K50819 Crohn's disease of both small and large intestine with unspecified complications: Secondary | ICD-10-CM | POA: Diagnosis not present

## 2023-01-05 DIAGNOSIS — D649 Anemia, unspecified: Secondary | ICD-10-CM

## 2023-01-05 DIAGNOSIS — Z932 Ileostomy status: Secondary | ICD-10-CM | POA: Diagnosis not present

## 2023-01-05 MED ORDER — OCTREOTIDE ACETATE 20 MG IM KIT
20.0000 mg | PACK | Freq: Once | INTRAMUSCULAR | Status: AC
  Administered 2023-01-05: 20 mg via INTRAMUSCULAR
  Filled 2023-01-05: qty 1

## 2023-01-05 NOTE — Progress Notes (Signed)
Diagnosis:   Crohn's disease of small and large intestines with complication    Provider:  Chilton Greathouse MD  Procedure: Injection  octreotide, Dose: 20 mg, Site: intramuscular, Number of injections: 1  Post Care:  p/a  Discharge: Condition: Good, Destination: Home . AVS Provided  Performed by:  Loney Hering, LPN

## 2023-01-07 ENCOUNTER — Other Ambulatory Visit: Payer: Self-pay | Admitting: Family Medicine

## 2023-01-07 DIAGNOSIS — F322 Major depressive disorder, single episode, severe without psychotic features: Secondary | ICD-10-CM

## 2023-01-10 ENCOUNTER — Inpatient Hospital Stay (HOSPITAL_COMMUNITY)
Admission: EM | Admit: 2023-01-10 | Discharge: 2023-01-13 | DRG: 683 | Disposition: A | Discharge status: Home / Self Care | Payer: Medicare Other | Attending: Internal Medicine | Admitting: Internal Medicine

## 2023-01-10 ENCOUNTER — Emergency Department (HOSPITAL_COMMUNITY): Payer: Medicare Other

## 2023-01-10 ENCOUNTER — Inpatient Hospital Stay (HOSPITAL_COMMUNITY): Payer: Medicare Other

## 2023-01-10 ENCOUNTER — Encounter (HOSPITAL_COMMUNITY): Payer: Self-pay

## 2023-01-10 DIAGNOSIS — E876 Hypokalemia: Secondary | ICD-10-CM | POA: Diagnosis present

## 2023-01-10 DIAGNOSIS — Z8719 Personal history of other diseases of the digestive system: Secondary | ICD-10-CM | POA: Diagnosis not present

## 2023-01-10 DIAGNOSIS — D539 Nutritional anemia, unspecified: Secondary | ICD-10-CM | POA: Diagnosis present

## 2023-01-10 DIAGNOSIS — E785 Hyperlipidemia, unspecified: Secondary | ICD-10-CM | POA: Diagnosis not present

## 2023-01-10 DIAGNOSIS — E878 Other disorders of electrolyte and fluid balance, not elsewhere classified: Secondary | ICD-10-CM | POA: Diagnosis present

## 2023-01-10 DIAGNOSIS — J45909 Unspecified asthma, uncomplicated: Secondary | ICD-10-CM | POA: Diagnosis present

## 2023-01-10 DIAGNOSIS — R0602 Shortness of breath: Secondary | ICD-10-CM | POA: Diagnosis not present

## 2023-01-10 DIAGNOSIS — R1084 Generalized abdominal pain: Principal | ICD-10-CM

## 2023-01-10 DIAGNOSIS — E871 Hypo-osmolality and hyponatremia: Secondary | ICD-10-CM | POA: Diagnosis not present

## 2023-01-10 DIAGNOSIS — K435 Parastomal hernia without obstruction or  gangrene: Secondary | ICD-10-CM | POA: Diagnosis present

## 2023-01-10 DIAGNOSIS — Z9049 Acquired absence of other specified parts of digestive tract: Secondary | ICD-10-CM | POA: Diagnosis not present

## 2023-01-10 DIAGNOSIS — I129 Hypertensive chronic kidney disease with stage 1 through stage 4 chronic kidney disease, or unspecified chronic kidney disease: Secondary | ICD-10-CM | POA: Diagnosis not present

## 2023-01-10 DIAGNOSIS — D75839 Thrombocytosis, unspecified: Secondary | ICD-10-CM | POA: Diagnosis present

## 2023-01-10 DIAGNOSIS — D631 Anemia in chronic kidney disease: Secondary | ICD-10-CM | POA: Diagnosis present

## 2023-01-10 DIAGNOSIS — R748 Abnormal levels of other serum enzymes: Secondary | ICD-10-CM | POA: Diagnosis present

## 2023-01-10 DIAGNOSIS — Z87891 Personal history of nicotine dependence: Secondary | ICD-10-CM | POA: Diagnosis not present

## 2023-01-10 DIAGNOSIS — K5010 Crohn's disease of large intestine without complications: Secondary | ICD-10-CM | POA: Diagnosis present

## 2023-01-10 DIAGNOSIS — K219 Gastro-esophageal reflux disease without esophagitis: Secondary | ICD-10-CM | POA: Diagnosis not present

## 2023-01-10 DIAGNOSIS — H9192 Unspecified hearing loss, left ear: Secondary | ICD-10-CM | POA: Diagnosis present

## 2023-01-10 DIAGNOSIS — E872 Acidosis, unspecified: Secondary | ICD-10-CM | POA: Diagnosis present

## 2023-01-10 DIAGNOSIS — M109 Gout, unspecified: Secondary | ICD-10-CM | POA: Diagnosis present

## 2023-01-10 DIAGNOSIS — N179 Acute kidney failure, unspecified: Principal | ICD-10-CM | POA: Diagnosis present

## 2023-01-10 DIAGNOSIS — I10 Essential (primary) hypertension: Secondary | ICD-10-CM

## 2023-01-10 DIAGNOSIS — N184 Chronic kidney disease, stage 4 (severe): Secondary | ICD-10-CM | POA: Diagnosis not present

## 2023-01-10 DIAGNOSIS — Z833 Family history of diabetes mellitus: Secondary | ICD-10-CM | POA: Diagnosis not present

## 2023-01-10 DIAGNOSIS — Z932 Ileostomy status: Secondary | ICD-10-CM | POA: Diagnosis not present

## 2023-01-10 DIAGNOSIS — D72828 Other elevated white blood cell count: Secondary | ICD-10-CM | POA: Diagnosis present

## 2023-01-10 DIAGNOSIS — E860 Dehydration: Secondary | ICD-10-CM

## 2023-01-10 DIAGNOSIS — Z90710 Acquired absence of both cervix and uterus: Secondary | ICD-10-CM

## 2023-01-10 DIAGNOSIS — E861 Hypovolemia: Secondary | ICD-10-CM | POA: Diagnosis present

## 2023-01-10 DIAGNOSIS — G2581 Restless legs syndrome: Secondary | ICD-10-CM | POA: Diagnosis present

## 2023-01-10 DIAGNOSIS — Z8249 Family history of ischemic heart disease and other diseases of the circulatory system: Secondary | ICD-10-CM | POA: Diagnosis not present

## 2023-01-10 DIAGNOSIS — Z79899 Other long term (current) drug therapy: Secondary | ICD-10-CM | POA: Diagnosis not present

## 2023-01-10 DIAGNOSIS — N200 Calculus of kidney: Secondary | ICD-10-CM | POA: Diagnosis not present

## 2023-01-10 LAB — COMPREHENSIVE METABOLIC PANEL
ALT: 29 U/L (ref 0–44)
AST: 28 U/L (ref 15–41)
Albumin: 4.2 g/dL (ref 3.5–5.0)
Alkaline Phosphatase: 125 U/L (ref 38–126)
Anion gap: 16 — ABNORMAL HIGH (ref 5–15)
BUN: 61 mg/dL — ABNORMAL HIGH (ref 8–23)
CO2: 13 mmol/L — ABNORMAL LOW (ref 22–32)
Calcium: 9.9 mg/dL (ref 8.9–10.3)
Chloride: 104 mmol/L (ref 98–111)
Creatinine, Ser: 4.88 mg/dL — ABNORMAL HIGH (ref 0.44–1.00)
GFR, Estimated: 9 mL/min — ABNORMAL LOW (ref >60)
Glucose, Bld: 161 mg/dL — ABNORMAL HIGH (ref 70–99)
Potassium: 4.2 mmol/L (ref 3.5–5.1)
Sodium: 133 mmol/L — ABNORMAL LOW (ref 135–145)
Total Bilirubin: 0.6 mg/dL (ref 0.3–1.2)
Total Protein: 9.3 g/dL — ABNORMAL HIGH (ref 6.5–8.1)

## 2023-01-10 LAB — CBC
HCT: 40.5 % (ref 36.0–46.0)
Hemoglobin: 13.1 g/dL (ref 12.0–15.0)
MCH: 33.7 pg (ref 26.0–34.0)
MCHC: 32.3 g/dL (ref 30.0–36.0)
MCV: 104.1 fL — ABNORMAL HIGH (ref 80.0–100.0)
Platelets: 444 10*3/uL — ABNORMAL HIGH (ref 150–400)
RBC: 3.89 MIL/uL (ref 3.87–5.11)
RDW: 16.1 % — ABNORMAL HIGH (ref 11.5–15.5)
WBC: 23.5 10*3/uL — ABNORMAL HIGH (ref 4.0–10.5)
nRBC: 0.5 % — ABNORMAL HIGH (ref 0.0–0.2)

## 2023-01-10 LAB — LIPASE, BLOOD: Lipase: 134 U/L — ABNORMAL HIGH (ref 11–51)

## 2023-01-10 MED ORDER — METOPROLOL SUCCINATE ER 50 MG PO TB24: 50.0000 mg | ORAL_TABLET | ORAL | Status: DC

## 2023-01-10 MED ORDER — ONDANSETRON HCL 4 MG PO TABS: 4.0000 mg | ORAL_TABLET | Freq: Four times a day (QID) | ORAL | Status: DC | PRN

## 2023-01-10 MED ORDER — DULOXETINE HCL 30 MG PO CPEP
120.0000 mg | ORAL_CAPSULE | Freq: Every day | ORAL | Status: DC
  Administered 2023-01-10 – 2023-01-11 (×2): 120 mg via ORAL
  Filled 2023-01-10 (×3): qty 4

## 2023-01-10 MED ORDER — ONDANSETRON HCL 4 MG/2ML IJ SOLN: 4.0000 mg | Freq: Four times a day (QID) | INTRAMUSCULAR | Status: DC | PRN

## 2023-01-10 MED ORDER — IOHEXOL 9 MG/ML PO SOLN
500.0000 mL | ORAL | Status: AC
  Administered 2023-01-10: 500 mL via ORAL

## 2023-01-10 MED ORDER — ACETAMINOPHEN 650 MG RE SUPP: 650.0000 mg | Freq: Four times a day (QID) | RECTAL | Status: DC | PRN

## 2023-01-10 MED ORDER — LOPERAMIDE HCL 2 MG PO TABS: 4.0000 mg | ORAL_TABLET | Freq: Two times a day (BID) | ORAL | Status: DC

## 2023-01-10 MED ORDER — SODIUM CHLORIDE 0.9 % IV SOLN: Freq: Once | INTRAVENOUS | Status: DC

## 2023-01-10 MED ORDER — SODIUM BICARBONATE 8.4 % IV SOLN
INTRAVENOUS | Status: DC
  Filled 2023-01-10: qty 1000
  Filled 2023-01-10: qty 150

## 2023-01-10 MED ORDER — ALLOPURINOL 300 MG PO TABS
150.0000 mg | ORAL_TABLET | Freq: Every day | ORAL | Status: DC
  Administered 2023-01-10 – 2023-01-12 (×3): 150 mg via ORAL
  Filled 2023-01-10 (×3): qty 1

## 2023-01-10 MED ORDER — LACTATED RINGERS IV BOLUS
1000.0000 mL | Freq: Once | INTRAVENOUS | Status: AC
  Administered 2023-01-10: 1000 mL via INTRAVENOUS

## 2023-01-10 MED ORDER — IOHEXOL 9 MG/ML PO SOLN
ORAL | Status: AC
  Filled 2023-01-10: qty 500

## 2023-01-10 MED ORDER — CHOLESTYRAMINE LIGHT 4 G PO PACK
4.0000 g | PACK | Freq: Three times a day (TID) | ORAL | Status: DC
  Administered 2023-01-11 – 2023-01-13 (×7): 4 g via ORAL
  Filled 2023-01-10 (×7): qty 1

## 2023-01-10 MED ORDER — HEPARIN SODIUM (PORCINE) 5000 UNIT/ML IJ SOLN
5000.0000 [IU] | Freq: Three times a day (TID) | INTRAMUSCULAR | Status: DC
  Administered 2023-01-10 – 2023-01-13 (×8): 5000 [IU] via SUBCUTANEOUS
  Filled 2023-01-10 (×8): qty 1

## 2023-01-10 MED ORDER — AMLODIPINE BESYLATE 5 MG PO TABS
5.0000 mg | ORAL_TABLET | Freq: Every day | ORAL | Status: DC
  Administered 2023-01-10 – 2023-01-13 (×3): 5 mg via ORAL
  Filled 2023-01-10 (×4): qty 1

## 2023-01-10 MED ORDER — METOPROLOL SUCCINATE ER 50 MG PO TB24
100.0000 mg | ORAL_TABLET | Freq: Every day | ORAL | Status: DC
  Administered 2023-01-10 – 2023-01-12 (×3): 100 mg via ORAL
  Filled 2023-01-10 (×3): qty 2

## 2023-01-10 MED ORDER — ACETAMINOPHEN 325 MG PO TABS: 650.0000 mg | ORAL_TABLET | Freq: Four times a day (QID) | ORAL | Status: DC | PRN

## 2023-01-10 MED ORDER — LOPERAMIDE HCL 2 MG PO CAPS
4.0000 mg | ORAL_CAPSULE | Freq: Two times a day (BID) | ORAL | Status: DC
  Administered 2023-01-10 – 2023-01-13 (×6): 4 mg via ORAL
  Filled 2023-01-10 (×7): qty 2

## 2023-01-10 NOTE — ED Triage Notes (Signed)
Pt c/o abdominal cramping and n/v since last night.  Pt reports she is followed by [facility] and they were supposed to be setting her up an appointment [facility] for fluids d/t elevated kidney labs.    Pt reports she previously got fluids twice a week, but they recently changed her to "a shot."

## 2023-01-10 NOTE — ED Notes (Signed)
Lt green, Lav, Blue, and Yellow tubes sent to lab.

## 2023-01-10 NOTE — ED Provider Notes (Signed)
Patient seen after prior EDP.  Hospitalist service aware of case and will evaluate for admission.   Wynetta Fines, MD 01/10/23 [phone]

## 2023-01-10 NOTE — H&P (Addendum)
History and Physical    Patient: [name] DOB: 1956-06-12 DOA: 01/10/2023 DOS: the patient was seen and examined on 01/10/2023 PCP: [name], DO  Patient coming from: Home  Chief Complaint:  Chief Complaint  Patient presents with   Abdominal Pain   Emesis   HPI: [name] is a 67 y.o. 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  Per patient-she was in her usual state of health approximately 3 days back-she has longstanding history of Crohn's disease-and has high output ostomy-and has to change her ostomy 3-5 times on a daily basis.  She gets long-acting octreotide injections every month (last dose apparently was on 5/1).  She no longer is taking Questran and Imodium-as the octreotide has helped maintain her output.  She follows closely with Dr. Adela Lank from [facility] gastroenterology and Dr. Arrie Aran from [facility].  Approximately 3 days back-she started developing abdominal pain-that she did describes as mostly cramps.  These occurred intermittently-and have by enlarge resolved today.  She is also had around 3-4 episodes of nonbloody-nonbloody bilious vomiting.  She has noted that her ostomy output has increased markedly-she now has to change her ostomy bag-6-7 times.  She is also had significantly decreased appetite these past 3 days.  She complains of subjective fever-sweats-but she never checked her temperature. She does not have any sick contacts.  There is no cough, headache, dysuria.  Because of the persistent nature of her symptoms-she was evaluated in the ED-and was found to have AKI on CKD 4, CT abdomen did not show any acute issues.  The hospitalist service was then asked to admit this patient for further evaluation and treatment.  + Subjective fever sweats No headache No chest  pain No shortness of breath No cough No hematuria/dysuria No bloody output from ostomy  Review of Systems: As mentioned in the history of present illness. All other systems reviewed and are negative. Past Medical History:  Diagnosis Date   Abdominal pain    Hx   Allergy    Anal stenosis    Anemia    Anxiety    Arthritis    Asthma    patient does not have inhaler   Blood in stool    Hx   Blood in urine    Hx   Blood transfusion without reported diagnosis    Cataract    CKD (chronic kidney disease) stage 3, GFR 30-59 ml/min (HCC)    Crohn's colitis, other complication (HCC)    De Quervain's tenosynovitis    Depression    Difficulty urinating    Hx   Easy bruising    Esophagitis    Fainting    History - resolved - due to dehydration   Fatigue    Hx   Fibroid    Hx   Gastric polyp    GERD (gastroesophageal reflux disease)    Hearing loss    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   Hemorrhoids, external    Hemorrhoids, internal    Herpes, genital    vaginal treated 07/05/12 and pt states is resolved   History of cervical dysplasia    History of small bowel obstruction    Hyperlipidemia    currently no meds   Hyperparathyroidism    Hypertension    Hypokalemia    Hx  IBD (inflammatory bowel disease)    initially colectomy for suspected UC, 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   Obesity    Ovarian cyst    Poor dental hygiene    Pulmonary nodule, right    right upper lobe   Rectal bleeding    Hx   Rectal pain    Hx   Renal insufficiency    CKD - stage 3   RLS (restless legs syndrome)    no meds   Tooth infection 11/2016   right low   Ulcerative colitis    Visual disturbance    wears glasses   Weakness generalized    Hx - patient denies generalized weakness   Wears dentures    upper only   Past Surgical History:  Procedure Laterality Date   ANAL DILATION     BIOPSY  07/20/2022   Procedure: BIOPSY;  Surgeon: Meryl Dare, MD;   Location: [facility];  Service: Gastroenterology;;   CERVICAL BIOPSY  W/ LOOP ELECTRODE EXCISION     CHOLECYSTECTOMY     COLONOSCOPY     [name]   fatty tumor removed from back     X 2   HEMORRHOID SURGERY     ILEOSCOPY N/A 07/20/2022   Procedure: ILEOSCOPY THROUGH STOMA;  Surgeon: Meryl Dare, MD;  Location: [facility];  Service: Gastroenterology;  Laterality: N/A;   ILEOSTOMY CLOSURE     RESTORATIVE PROCTOCOLECTOMY     with insertion of ileoanal J Pouch with loop ileostomy   SHOULDER ARTHROSCOPY WITH CAPSULORRHAPHY Left 06/14/2019   Procedure: LEFT SHOULDER ARTHRSCOPIC REPAIR OF BONY BANKART FRACTURE;  Surgeon: Jones Broom, MD;  Location: [facility];  Service: Orthopedics;  Laterality: Left;  REQUEST 90 MINUTE   SIGMOIDOSCOPY     TOTAL ABDOMINAL HYSTERECTOMY  1998   TAH/LSO   TUBAL LIGATION     UPPER GASTROINTESTINAL ENDOSCOPY     [name]   Social History:  reports that she quit smoking about 43 years ago. Her smoking use included cigarettes. She has a 4.00 pack-year smoking history. She has never used smokeless tobacco. She reports that she does not drink alcohol and does not use drugs.  Allergies  Allergen Reactions   Sulfa Antibiotics Hives, Itching, Rash and Swelling   Morphine Other (See Comments)    "Gives me crazy dreams" (delusions, also)  Other Reaction(s): Other (See Comments), Psychosis  Gives me crazy dreams   Sulfonamide Derivatives Hives, Itching and Swelling    Family History  Problem Relation Age of Onset   Hypertension Mother    Heart disease Mother        s/p pci   Ulcerative colitis Father    Hypertension Father    Heart attack Father    Diabetes Sister    Cancer Sister        uterine   Pulmonary fibrosis Sister    Ulcerative colitis Daughter    Cancer Maternal Uncle        LUNG   Irritable bowel syndrome Other        grandchildren   Colon cancer Neg Hx    Esophageal cancer Neg Hx    Stomach cancer Neg Hx    Rectal cancer Neg Hx      Prior to Admission medications   Medication Sig Start Date End Date Taking? Authorizing Provider  acetaminophen (TYLENOL) 500 MG tablet Take 1,000 mg by mouth at bedtime as needed (for pain/sleep).    [provider]  allopurinol (ZYLOPRIM)  300 MG tablet Take 0.5 tablets (150 mg total) by mouth at bedtime. 12/10/22   Seabron Spates R, DO  amLODipine (NORVASC) 5 MG tablet Take 5 mg by mouth daily. 05/20/22   [provider]  Calcium Carbonate Antacid (TUMS EXTRA STRENGTH 750 PO) Take 2 tablets by mouth daily as needed (CHEW for heartburn).    [provider]  cholestyramine light (PREVALITE) 4 g packet Take 1 packet (4 g total) by mouth 3 (three) times daily after meals. 07/21/22   Willeen Niece, MD  diphenhydrAMINE (BENADRYL ALLERGY) 25 MG tablet Take 25 mg by mouth at bedtime.    [provider]  doxycycline (VIBRA-TABS) 100 MG tablet Take 1 tablet (100 mg total) by mouth 2 (two) times daily. 10/01/22   [name], DO  DULoxetine (CYMBALTA) 60 MG capsule TAKE TWO CAPSULES (120 mg total) BY MOUTH DAILY AT 9AM 01/07/23   Zola Button, Grayling Congress, DO  folic acid (FOLVITE) 1 MG tablet Take 2 tablets (2 mg total) by mouth daily. Patient taking differently: Take 1 mg by mouth daily. 05/26/17   Josph Macho, MD  GAS-X EXTRA STRENGTH 125 MG CAPS Take 125 mg by mouth at bedtime.    [provider]  loperamide (IMODIUM A-D) 2 MG tablet Take 4 mg by mouth in the morning and at bedtime.    [provider]  metoprolol succinate (TOPROL-XL) 100 MG 24 hr tablet Take 50 mg by mouth See admin instructions. Take 50 mg by mouth in the morning and afternoon    [provider]  OVER THE COUNTER MEDICATION Take 1 tablet by mouth See admin instructions. Nervive Nerve Health tablet- Take 1 tablet by mouth at bedtime    [provider]  Vitamin D, Ergocalciferol, (DRISDOL) 1.25 MG (50000 UNIT) CAPS capsule Take 50,000 Units by mouth  every 30 (thirty) days.    [provider]    Physical Exam: Vitals:   01/10/23 1251 01/10/23 1520 01/10/23 1530 01/10/23 1540  BP:   (!) 133/90 (!) 133/90  Pulse:  (!) 50  (!) 50  Resp:    18  Temp:    98.1 F (36.7 C)  TempSrc:    Oral  SpO2:  100%  100%  Weight: 86.2 kg     Height: 5\' 3"  (1.6 m)      Gen Exam:Alert awake-not in any distress HEENT:atraumatic, normocephalic Chest: B/L clear to auscultation anteriorly CVS:S1S2 regular Abdomen:soft non tender, non distended-watery/liquidy stool in ostomy bag. Extremities:no edema Neurology: Non focal Skin: no rash  Data Reviewed:    Latest Ref Rng & Units 01/10/2023    1:04 PM 10/12/2022    1:10 PM 08/03/2022   12:19 PM  CBC  WBC 4.0 - 10.5 K/uL 23.5  11.8  12.5   Hemoglobin 12.0 - 15.0 g/dL 40.9  81.1  91.4   Hematocrit 36.0 - 46.0 % 40.5  35.9  33.3   Platelets 150 - 400 K/uL 444  335  396.0         Latest Ref Rng & Units 01/10/2023    1:04 PM 10/12/2022    1:10 PM 08/03/2022   12:19 PM  BMP  Glucose 70 - 99 mg/dL 782  956  86   BUN 8 - 23 mg/dL 61  57  50   Creatinine 0.44 - 1.00 mg/dL 2.13  0.86  5.78   Sodium 135 - 145 mmol/L 133  140  137   Potassium 3.5 -  5.1 mmol/L 4.2  3.9  4.3   Chloride 98 - 111 mmol/L 104  109  110   CO2 22 - 32 mmol/L 13  20  17    Calcium 8.9 - 10.3 mg/dL 9.9  8.8  8.8      Assessment and Plan: AKI on CKD 4 AKI likely hemodynamically mediated-in the setting of high output ostomy Hydrate with IVF Avoid nephrotoxic agents Hopefully with treatment for high output ostomy-creatinine will get back to her baseline. If no improvement with these measures-please consult nephrology tomorrow morning-her primary nephrologist is Dr. Arrie Aran with [facility].  Anion gap metabolic acidosis Due to combination of diarrhea/AKI Hydrate with IVF containing bicarb. Watch closely-follow electrolytes  Crohn's disease-s/p total colectomy-with longstanding history of high output  ostomy Reviewed outpatient GI note-Dr. Adline Mango been tried on numerous biologic agents-has had second opinion from [facility] GI team. She currently is not on any DMARDs or any biologic agents.  High output ostomy Gets long-acting octreotide injections every month She has not been taking colestyramine/loperamide-as her symptoms were controlled with octreotide-hence will resume both cholestyramine/loperamide and see how she does. If she does not have any improvement with these measures-we may need to increase loperamide dosage, add Lomotil-and consider getting GI and.  Leukocytosis Nontoxic-afebrile here in the ED but has been having what sounds like subjective fever with sweats at home Check UA Check CXR-although no PNA Check ostomy output for GI pathogen panel Blood cultures CT abdomen without any sources of infection Given relative stability-monitor off antibiotics  Elevated lipase levels Did have some crampy abdominal pain for the past few days but none today Although lipase is elevated-she has had no abdominal pain today-abdominal exam is benign-no major pancreatic pathology seen on CT abdomen.  Patient is s/p cholecystectomy-no biliary lesion seen on CT abdomen as well.  Clinical features are not very consistent with pancreatitis at this time. Will place on a regular diet and see how she does Repeat lipase levels tomorrow-if she were to have recurrent crampy abdominal pain-can back down to full liquid/clear liquids.  HTN Continue amlodipine   Advance Care Planning:   Code Status: Full Code   Consults: None  Family Communication: None  Severity of Illness: The appropriate patient status for this patient is INPATIENT. Inpatient status is judged to be reasonable and necessary in order to provide the required intensity of service to ensure the patient's safety. The patient's presenting symptoms, physical exam findings, and initial radiographic and laboratory data in the context of  their chronic comorbidities is felt to place them at high risk for further clinical deterioration. Furthermore, it is not anticipated that the patient will be medically stable for discharge from the hospital within 2 midnights of admission.   * I certify that at the point of admission it is my clinical judgment that the patient will require inpatient hospital care spanning beyond 2 midnights from the point of admission due to high intensity of service, high risk for further deterioration and high frequency of surveillance required.*  Author: Jeoffrey Massed, MD 01/10/2023 7:06 PM  For on call review [email or web address].

## 2023-01-10 NOTE — ED Provider Notes (Signed)
[place] EMERGENCY DEPARTMENT AT [facility] Provider Note   CSN: [id number] Arrival date & time: 01/10/23  1235     History  Chief Complaint  Patient presents with   Abdominal Pain   Emesis    [name] is a 67 y.o. female.  HPI Patient with history of Crohn's disease, now status post total colectomy, with ileostomy in place presents with abdominal pain, nausea, vomiting.  Patient also has known kidney disease, has been receiving octreotide injections, occasional fluid infusions.  In the past 3 days she has had progressive abdominal pain, weakness, nausea, vomiting.  No fever.    Home Medications Prior to Admission medications   Medication Sig Start Date End Date Taking? Authorizing Provider  acetaminophen (TYLENOL) 500 MG tablet Take 1,000 mg by mouth at bedtime as needed (for pain/sleep).    [provider]  allopurinol (ZYLOPRIM) 300 MG tablet Take 0.5 tablets (150 mg total) by mouth at bedtime. 12/10/22   Seabron Spates R, DO  amLODipine (NORVASC) 5 MG tablet Take 5 mg by mouth daily. 05/20/22   [provider]  Calcium Carbonate Antacid (TUMS EXTRA STRENGTH 750 PO) Take 2 tablets by mouth daily as needed (CHEW for heartburn).    [provider]  cholestyramine light (PREVALITE) 4 g packet Take 1 packet (4 g total) by mouth 3 (three) times daily after meals. 07/21/22   Willeen Niece, MD  diphenhydrAMINE (BENADRYL ALLERGY) 25 MG tablet Take 25 mg by mouth at bedtime.    [provider]  doxycycline (VIBRA-TABS) 100 MG tablet Take 1 tablet (100 mg total) by mouth 2 (two) times daily. 10/01/22   [name], DO  DULoxetine (CYMBALTA) 60 MG capsule TAKE TWO CAPSULES (120 mg total) BY MOUTH DAILY AT 9AM 01/07/23   Zola Button, Grayling Congress, DO  folic acid (FOLVITE) 1 MG tablet Take 2 tablets (2 mg total) by mouth daily. Patient taking differently: Take 1 mg by mouth daily. 05/26/17   Josph Macho, MD  GAS-X EXTRA  STRENGTH 125 MG CAPS Take 125 mg by mouth at bedtime.    [provider]  loperamide (IMODIUM A-D) 2 MG tablet Take 4 mg by mouth in the morning and at bedtime.    [provider]  metoprolol succinate (TOPROL-XL) 100 MG 24 hr tablet Take 50 mg by mouth See admin instructions. Take 50 mg by mouth in the morning and afternoon    [provider]  OVER THE COUNTER MEDICATION Take 1 tablet by mouth See admin instructions. Nervive Nerve Health tablet- Take 1 tablet by mouth at bedtime    [provider]  Vitamin D, Ergocalciferol, (DRISDOL) 1.25 MG (50000 UNIT) CAPS capsule Take 50,000 Units by mouth every 30 (thirty) days.    [provider]      Allergies    Sulfa antibiotics, Morphine, and Sulfonamide derivatives    Review of Systems   Review of Systems  All other systems reviewed and are negative.   Physical Exam Updated Vital Signs BP (!) 133/90 (BP Location: Right Arm)   Pulse (!) 50   Temp 98.1 F (36.7 C) (Oral)   Resp 18   Ht 5\' 3"  (1.6 m)   Wt 86.2 kg   SpO2 100%   BMI 33.66 kg/m  Physical Exam Vitals and nursing note reviewed.  Constitutional:      General: She is not in acute distress.    Appearance: She is well-developed.  HENT:  Head: Normocephalic and atraumatic.  Eyes:     Conjunctiva/sclera: Conjunctivae normal.  Cardiovascular:     Rate and Rhythm: Normal rate and regular rhythm.  Pulmonary:     Effort: Pulmonary effort is normal. No respiratory distress.     Breath sounds: Normal breath sounds. No stridor.  Abdominal:     General: There is no distension.     Tenderness: There is generalized abdominal tenderness.  Skin:    General: Skin is warm and dry.  Neurological:     Mental Status: She is alert and oriented to person, place, and time.     Cranial Nerves: No cranial nerve deficit.  Psychiatric:        Mood and Affect: Mood normal.     ED Results / Procedures / Treatments   Labs (all labs ordered  are listed, but only abnormal results are displayed) Labs Reviewed  LIPASE, BLOOD - Abnormal; Notable for the following components:      Result Value   Lipase 134 (*)    All other components within normal limits  COMPREHENSIVE METABOLIC PANEL - Abnormal; Notable for the following components:   Sodium 133 (*)    CO2 13 (*)    Glucose, Bld 161 (*)    BUN 61 (*)    Creatinine, Ser 4.88 (*)    Total Protein 9.3 (*)    GFR, Estimated 9 (*)    Anion gap 16 (*)    All other components within normal limits  CBC - Abnormal; Notable for the following components:   WBC 23.5 (*)    MCV 104.1 (*)    RDW 16.1 (*)    Platelets 444 (*)    nRBC 0.5 (*)    All other components within normal limits  URINALYSIS, ROUTINE W REFLEX MICROSCOPIC    EKG None  Radiology No results found.  Procedures Procedures    Medications Ordered in ED Medications  lactated ringers bolus 1,000 mL (0 mLs Intravenous Stopped 01/10/23 1534)  iohexol (OMNIPAQUE) 9 MG/ML oral solution 500 mL (500 mLs Oral Contrast Given 01/10/23 1548)    ED Course/ Medical Decision Making/ A&P                             Medical Decision Making Adult female with history of Crohn's, complicated course including possible inflammatory changes on initial colectomy presents with abdominal pain, weakness, nausea, vomiting.  Concern for infection versus dehydration versus other intra-abdominal processes.  CT ordered, labs ordered, fluids ordered.  Amount and/or Complexity of Data Reviewed External Data Reviewed: notes. Labs: ordered. Decision-making details documented in ED Course. Radiology: ordered and independent interpretation performed. Decision-making details documented in ED Course.  Risk Prescription drug management. Decision regarding hospitalization.  GI notes from 2 months ago with summary of course included below: HPI :  IBD history: 67 year old white female with IBD ( Crohn's) since 52.  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 She was never able to be put in remission prior to colectomy. She was only exposed to steroids prior to her surgery. Post-operatively, she was on Remicade for 3-4 years but did not help too much. She had been on remotely but did not work for her. She was on Humira but also did not put her into remission and she developed high antibody titers and it was stopped. Most recently on Entyvio and low dose . was  stopped due to worsening anemia.  She reports multiple courses of antibiotics with some benefit historically.  She has been on budesonider with ? Relief. Father and daughter have UC.  She has seen Riccardo Dubin and Dr. 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 Most recently saw Dr. Inetta Fermo at [facility] and had diverting ileostomy April 23 2021. Returned for J pouch excision Jan 2023.   4:10 PM Right substantially better.  Initial labs just above pancreatitis with elevated lipase, and leukocytosis.  Given patient's prior interventions, including surgery, ileostomy, CT scan has been ordered, is pending on signout.         Final Clinical Impression(s) / ED Diagnoses Final diagnoses:  Generalized abdominal pain    Rx / DC Orders ED Discharge Orders     None         Gerhard Munch, MD 01/10/23 [phone]

## 2023-01-11 DIAGNOSIS — N184 Chronic kidney disease, stage 4 (severe): Secondary | ICD-10-CM | POA: Diagnosis not present

## 2023-01-11 DIAGNOSIS — R1084 Generalized abdominal pain: Secondary | ICD-10-CM

## 2023-01-11 DIAGNOSIS — K219 Gastro-esophageal reflux disease without esophagitis: Secondary | ICD-10-CM | POA: Diagnosis not present

## 2023-01-11 DIAGNOSIS — N179 Acute kidney failure, unspecified: Secondary | ICD-10-CM | POA: Diagnosis not present

## 2023-01-11 LAB — BLOOD GAS, ARTERIAL
Acid-base deficit: 1.3 mmol/L (ref 0.0–2.0)
Bicarbonate: 22.7 mmol/L (ref 20.0–28.0)
Drawn by: 331471
O2 Saturation: 98.7 %
Patient temperature: 37.1
pCO2 arterial: 35 mmHg (ref 32–48)
pH, Arterial: 7.42 (ref 7.35–7.45)
pO2, Arterial: 81 mmHg — ABNORMAL LOW (ref 83–108)

## 2023-01-11 LAB — GASTROINTESTINAL PANEL BY PCR, STOOL (REPLACES STOOL CULTURE)

## 2023-01-11 LAB — COMPREHENSIVE METABOLIC PANEL
ALT: 19 U/L (ref 0–44)
AST: 20 U/L (ref 15–41)
Albumin: 3.4 g/dL — ABNORMAL LOW (ref 3.5–5.0)
Alkaline Phosphatase: 91 U/L (ref 38–126)
Anion gap: 14 (ref 5–15)
BUN: 53 mg/dL — ABNORMAL HIGH (ref 8–23)
CO2: 22 mmol/L (ref 22–32)
Calcium: 8.4 mg/dL — ABNORMAL LOW (ref 8.9–10.3)
Chloride: 95 mmol/L — ABNORMAL LOW (ref 98–111)
Creatinine, Ser: 4.83 mg/dL — ABNORMAL HIGH (ref 0.44–1.00)
GFR, Estimated: 9 mL/min — ABNORMAL LOW (ref >60)
Glucose, Bld: 152 mg/dL — ABNORMAL HIGH (ref 70–99)
Potassium: 3.4 mmol/L — ABNORMAL LOW (ref 3.5–5.1)
Sodium: 131 mmol/L — ABNORMAL LOW (ref 135–145)
Total Bilirubin: 0.6 mg/dL (ref 0.3–1.2)
Total Protein: 6.9 g/dL (ref 6.5–8.1)

## 2023-01-11 LAB — URINALYSIS, ROUTINE W REFLEX MICROSCOPIC
Bilirubin Urine: NEGATIVE
Glucose, UA: NEGATIVE mg/dL
Ketones, ur: NEGATIVE mg/dL
Nitrite: NEGATIVE
Protein, ur: 100 mg/dL — AB
RBC / HPF: >50 RBC/hpf (ref 0–5)
Specific Gravity, Urine: 1.015 (ref 1.005–1.030)
WBC, UA: >50 WBC/hpf (ref 0–5)
pH: 6 (ref 5.0–8.0)

## 2023-01-11 LAB — CBC
HCT: 31.5 % — ABNORMAL LOW (ref 36.0–46.0)
Hemoglobin: 10.3 g/dL — ABNORMAL LOW (ref 12.0–15.0)
MCH: 33.3 pg (ref 26.0–34.0)
MCHC: 32.7 g/dL (ref 30.0–36.0)
MCV: 101.9 fL — ABNORMAL HIGH (ref 80.0–100.0)
Platelets: 333 10*3/uL (ref 150–400)
RBC: 3.09 MIL/uL — ABNORMAL LOW (ref 3.87–5.11)
RDW: 15.6 % — ABNORMAL HIGH (ref 11.5–15.5)
WBC: 17.1 10*3/uL — ABNORMAL HIGH (ref 4.0–10.5)
nRBC: 0.4 % — ABNORMAL HIGH (ref 0.0–0.2)

## 2023-01-11 LAB — LIPASE, BLOOD: Lipase: 73 U/L — ABNORMAL HIGH (ref 11–51)

## 2023-01-11 MED ORDER — SODIUM CHLORIDE 0.9 % IV SOLN: INTRAVENOUS | Status: AC

## 2023-01-11 MED ORDER — LACTATED RINGERS IV BOLUS: 500.0000 mL | Freq: Once | INTRAVENOUS | Status: DC

## 2023-01-11 MED ORDER — SODIUM CHLORIDE 0.9 % IV BOLUS
500.0000 mL | Freq: Once | INTRAVENOUS | Status: AC
  Administered 2023-01-11: 500 mL via INTRAVENOUS

## 2023-01-11 MED ORDER — DULOXETINE HCL 30 MG PO CPEP
120.0000 mg | ORAL_CAPSULE | Freq: Every day | ORAL | Status: DC
  Administered 2023-01-12: 120 mg via ORAL
  Filled 2023-01-11: qty 4

## 2023-01-11 MED ORDER — SODIUM CHLORIDE 0.9 % IV SOLN: INTRAVENOUS | Status: DC

## 2023-01-11 NOTE — Progress Notes (Signed)
   01/10/23 2340  Assess: MEWS Score  Temp 98.6 F (37 C)  BP 127/83  MAP (mmHg) 95  Pulse Rate (!) 126  Resp 16  SpO2 98 %  O2 Device Room Air  Assess: MEWS Score  MEWS Temp 0  MEWS Systolic 0  MEWS Pulse 2  MEWS RR 0  MEWS LOC 0  MEWS Score 2  MEWS Score Color Yellow  Assess: if the MEWS score is Yellow or Red  Were vital signs taken at a resting state? Yes  Focused Assessment Change from prior assessment (see assessment flowsheet)  Does the patient meet 2 or more of the SIRS criteria? Yes  Does the patient have a confirmed or suspected source of infection? Yes  MEWS guidelines implemented  Yes, yellow  Treat  MEWS Interventions Considered administering scheduled or prn medications/treatments as ordered  Take Vital Signs  Increase Vital Sign Frequency  Yellow: Q2hr x1, continue Q4hrs until patient remains green for 12hrs  Escalate  MEWS: Escalate Yellow: Discuss with charge nurse and consider notifying provider and/or RRT  Notify: Charge Nurse/RN  Name of Charge Nurse/RN Notified T Lynnsie Linders  Provider Notification  Provider Name/Title [email or web address]  Date Provider Notified 01/10/23  Time Provider Notified 2357  Method of Notification Page  Notification Reason Change in status  Provider response No new orders  Date of Provider Response 01/11/23  Time of Provider Response 0000  Assess: SIRS CRITERIA  SIRS Temperature  0  SIRS Pulse 1  SIRS Respirations  0  SIRS WBC 0  SIRS Score Sum  1

## 2023-01-11 NOTE — Progress Notes (Addendum)
[facility] PROGRESS NOTE    Progress Note  [name]  ZOX:[id number] DOB: 02/18/1956 DOA: 01/10/2023 PCP: [name], DO     Brief Narrative:   [name] is an 67 y.o. female past medical history significant with chronic kidney disease stage IV, history of Crohn's disease status post total colectomy with high output ostomy, essential hypertension comes in with abdominal pain nausea and vomiting for 3 days when she is noted that her ostomy output has increased and has significant decreased appetite, with subjective fevers at home the ED she was found to be in acute kidney injury CT scan of the abdomen and pelvis showed no acute findings   Assessment/Plan:   AKI (acute kidney injury) (HCC) chronic kidney disease stage IV: With a baseline creatinine of 2.7-3.1. Likely hemodynamically mediated due to high output through her ostomy. She was started on D5 with bicarbonate her creatinine is unchanged, BUN is improving Discontinue D5 with a bicarbonate, give her another bolus of normal saline and started on normal saline.  Recheck basic metabolic panel in the morning.  Diffuse abdominal pain/emesis: She is tolerating her diet her abdominal pain is resolved. CT scan of the abdomen pelvis showed no acute findings, but again showed new parastomal hernia, she is nontender on physical exam. GI panel is pending.  Possible normal anion gap acidosis superimposed on anion gap metabolic acidosis: Still with an anion gap of 15 (corrected for her albumin), check an ABG to confirm bicarb at admission, which should be anywhere to from 26-30.  If bicarb is lower than 26 she has a superimposed normal anion gap metabolic acidosis which is likely due to hypovolemia due to GI output losses. Likely due to high output. Discontinue bicarbonate as it does not work for anion gap acidosis, is causing her chloride to rise. Started on aggressive IV normal saline. Check a renal panel  tomorrow morning.  Hypovolemic hyponatremia: She is hypochloremic, I believe the hypochloremia is likely due to the excess bicarbonate. Change her IV fluids to normal saline and encourage the patient drink as much fluid as she can orally. Also give her a bolus of normal saline Recheck basic metabolic panel in the morning.  Crohn's disease status post colectomy with longstanding high output ostomy: She is not on biologic agents. Continue long-acting octreotide every month.  Resume cholestyramine and loperamide, She relates that she only change her bag this morning did not had to change it overnight. She believes it is slowed down. If no improvement can increase her loperamide.  Leukocytosis: Tmax 98.6, on admission she was 23, blood cultures were obtained and have remained negative till date. Question reactive unlikely she is infected. Her white count is down this morning to  17 after just IV fluids. UA does not appear to be infected. Chest x-ray showed no acute findings. CT scan of the abdomen pelvis showed surgical changes, new parastomal hernia containing loops but nonobstructive no acute findings to explain leukocytosis. Continue to monitor off antibiotics.  Elevated lipase: She has no abdominal pain, CT scan showed no acute pancreatitis. She was placed on a regular diet which she has been tolerating. Lipase is improved this morning.  Essential hypertension: Continue amlodipine.  Macrocytic anemia: Hemoglobin runs around 10, on admission she was 13 after IV fluids now is 10 is a hemodilutional effect. Continue IV fluids.  Thrombocytosis: Likely reactive she has remained afebrile this morning is 333    DVT prophylaxis: lovenox Family Communication:none Status is:  Inpatient Remains inpatient appropriate because: Acute kidney injury.    Code Status:     Code Status Orders  (From admission, onward)           Start     Ordered   01/10/23 1900  Full code   Continuous       Question:  By:  Answer:  Consent: discussion documented in EHR   01/10/23 1900           Code Status History     Date Active Date Inactive Code Status Order ID Comments User Context   07/17/2022 0030 07/21/2022 2216 Partial Code [id number]  Teddy Spike, DO ED   07/06/2022 2234 07/12/2022 1815 DNR [id number]  Mansy, Vernetta Honey, MD ED   07/06/2022 2224 07/06/2022 2234 Full Code [id number]  Mansy, Vernetta Honey, MD ED   06/29/2022 2356 07/01/2022 2159 Full Code [id number]  Anselm Jungling, DO Inpatient   06/18/2021 0944 06/18/2021 1721 Full Code [id number]  Kandra Nicolas, RN Outpatient   05/14/2021 1750 05/16/2021 2049 Full Code [id number]  Clydie Braun, MD ED   12/08/2020 0006 12/10/2020 2235 Full Code [id number]  Charlsie Quest, MD ED   09/21/2020 0830 09/24/2020 2147 Full Code [id number]  Laqueta Due, DO ED   08/23/2020 0136 08/25/2020 2144 Full Code [id number]  Marinda Elk, MD Inpatient   01/15/2017 0242 01/15/2017 1858 Full Code [id number]  Danford, Earl Lites, MD Inpatient         IV Access:   Peripheral IV   Procedures and diagnostic studies:   DG Chest Port 1V same Day  Result Date: 01/10/2023 CLINICAL DATA:  Shortness of breath EXAM: PORTABLE CHEST 1 VIEW COMPARISON:  Chest x-ray 06/29/2022 FINDINGS: The heart size and mediastinal contours are within normal limits. Both lungs are clear. The visualized skeletal structures are unremarkable. IMPRESSION: No active disease. Electronically Signed   By: Darliss Cheney M.D.   On: 01/10/2023 19:08   CT ABDOMEN PELVIS WO CONTRAST  Result Date: 01/10/2023 CLINICAL DATA:  History of Crohn's disease status post total colectomy and ileostomy with abdominal pain, nausea, and vomiting EXAM: CT ABDOMEN AND PELVIS WITHOUT CONTRAST TECHNIQUE: Multidetector CT imaging of the abdomen and pelvis was performed following the standard protocol without IV contrast. RADIATION DOSE REDUCTION: This exam was performed according to the  departmental dose-optimization program which includes automated exposure control, adjustment of the mA and/or kV according to patient size and/or use of iterative reconstruction technique. COMPARISON:  CT abdomen and pelvis dated 06/29/2022 FINDINGS: Lower chest: Bilateral lower lobe subpleural band like scarring. No pleural effusion or pneumothorax demonstrated. Partially imaged heart size is normal. Hepatobiliary: No focal hepatic lesions. No intra or extrahepatic biliary ductal dilation. Cholecystectomy. Pancreas: No focal lesions or main ductal dilation. Spleen: Normal in size without focal abnormality. Adrenals/Urinary Tract: No adrenal nodules. No suspicious renal mass or hydronephrosis. Punctate nonobstructing left upper pole stone. No focal bladder wall thickening. Stomach/Bowel: Normal appearance of the stomach. Postsurgical changes from total colectomy and right hemi abdominal ileostomy. 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 No evidence of bowel wall thickening, distention, or inflammatory changes. Normal appendix. Vascular/Lymphatic: Aortic atherosclerosis. No enlarged abdominal or pelvic lymph nodes. Reproductive: Unchanged right adnexal 2.7 x 2.0 cm simple fluid attenuation focus (2:65), possibly simple ovarian/paraovarian cyst. Other: No free fluid, fluid collection, or free air. Musculoskeletal: No acute or abnormal lytic or blastic osseous lesions. Postsurgical changes of the anterior abdominal wall. IMPRESSION: 1. Postsurgical changes  from total colectomy and right hemi abdominal ileostomy. 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 Otherwise, no acute infectious/inflammatory findings in the abdomen or pelvis. 2. Punctate nonobstructing left upper pole stone. 3.  Aortic Atherosclerosis (ICD10-I70.0). Electronically Signed   By: Agustin Cree M.D.   On: 01/10/2023 17:24     Medical Consultants:   None.   Subjective:    [name]  she is currently asymptomatic this morning tolerating her diet.  Objective:    Vitals:   01/10/23 1943 01/10/23 2340 01/11/23 0140 01/11/23 0540  BP: 127/79 127/83 122/87 112/80  Pulse:  (!) 126 (!) 109 (!) 101  Resp:  16 16 16   Temp: (!) 97.5 F (36.4 C) 98.6 F (37 C) 98.3 F (36.8 C) 97.8 F (36.6 C)  TempSrc: Oral Oral Oral Oral  SpO2: 99% 98% 95% 97%  Weight:      Height:       SpO2: 97 %   Intake/Output Summary (Last 24 hours) at 01/11/2023 0736 Last data filed at 01/11/2023 0600 Gross per 24 hour  Intake 1664.88 ml  Output 1000 ml  Net 664.88 ml   Filed Weights   01/10/23 1251  Weight: 86.2 kg    Exam: General exam: In no acute distress. Respiratory system: Good air movement and clear to auscultation. Cardiovascular system: S1 & S2 heard, RRR. No JVD. Gastrointestinal system: Abdomen is nondistended, soft and nontender.  Extremities: No pedal edema. Skin: No rashes, lesions or ulcers Psychiatry: Judgement and insight appear normal. Mood & affect appropriate.    Data Reviewed:    Labs: Basic Metabolic Panel: Recent Labs  Lab 01/10/23 1304 01/11/23 0533  NA 133* 131*  K 4.2 3.4*  CL 104 95*  CO2 13* 22  GLUCOSE 161* 152*  BUN 61* 53*  CREATININE 4.88* 4.83*  CALCIUM 9.9 8.4*   GFR Estimated Creatinine Clearance: 11.4 mL/min (A) (by C-G formula based on SCr of 4.83 mg/dL (H)). Liver Function Tests: Recent Labs  Lab 01/10/23 1304 01/11/23 0533  AST 28 20  ALT 29 19  ALKPHOS 125 91  BILITOT 0.6 0.6  PROT 9.3* 6.9  ALBUMIN 4.2 3.4*   Recent Labs  Lab 01/10/23 1304 01/11/23 0533  LIPASE 134* 73*   No results for input(s): "AMMONIA" in the last 168 hours. Coagulation profile No results for input(s): "INR", "PROTIME" in the last 168 hours. COVID-19 Labs  No results for input(s): "DDIMER", "FERRITIN", "LDH", "CRP" in the last 72 hours.  Lab Results  Component Value Date   SARSCOV2NAA NEGATIVE 09/22/2021   SARSCOV2NAA NEGATIVE  05/26/2021   SARSCOV2NAA NEGATIVE 05/14/2021   SARSCOV2NAA NEGATIVE 05/01/2021    CBC: Recent Labs  Lab 01/10/23 1304 01/11/23 0533  WBC 23.5* 17.1*  HGB 13.1 10.3*  HCT 40.5 31.5*  MCV 104.1* 101.9*  PLT 444* 333   Cardiac Enzymes: No results for input(s): "CKTOTAL", "CKMB", "CKMBINDEX", "TROPONINI" in the last 168 hours. BNP (last 3 results) No results for input(s): "PROBNP" in the last 8760 hours. CBG: No results for input(s): "GLUCAP" in the last 168 hours. D-Dimer: No results for input(s): "DDIMER" in the last 72 hours. Hgb A1c: No results for input(s): "HGBA1C" in the last 72 hours. Lipid Profile: No results for input(s): "CHOL", "HDL", "LDLCALC", "TRIG", "CHOLHDL", "LDLDIRECT" in the last 72 hours. Thyroid function studies: No results for input(s): "TSH", "T4TOTAL", "T3FREE", "THYROIDAB" in the last 72 hours.  Invalid input(s): "FREET3" Anemia work up: No results for input(s): "VITAMINB12", "  FOLATE", "FERRITIN", "TIBC", "IRON", "RETICCTPCT" in the last 72 hours. Sepsis Labs: Recent Labs  Lab 01/10/23 1304 01/11/23 0533  WBC 23.5* 17.1*   Microbiology No results found for this or any previous visit (from the past 240 hour(s)).   Medications:    allopurinol  150 mg Oral QHS   amLODipine  5 mg Oral Daily   cholestyramine light  4 g Oral TID   DULoxetine  120 mg Oral Daily   heparin  5,000 Units Subcutaneous Q8H   loperamide  4 mg Oral BID   metoprolol succinate  100 mg Oral QHS   Continuous Infusions:  sodium chloride 150 mL/hr at 01/11/23 0537   sodium bicarbonate 150 mEq in dextrose 5 % 1,150 mL infusion 125 mL/hr at 01/10/23 1938      LOS: 1 day   Marinda Elk  [facility]  01/11/2023, 7:36 AM

## 2023-01-12 ENCOUNTER — Other Ambulatory Visit: Payer: Self-pay

## 2023-01-12 DIAGNOSIS — N179 Acute kidney failure, unspecified: Secondary | ICD-10-CM | POA: Diagnosis not present

## 2023-01-12 LAB — RENAL FUNCTION PANEL
Albumin: 3 g/dL — ABNORMAL LOW (ref 3.5–5.0)
Anion gap: 8 (ref 5–15)
BUN: 52 mg/dL — ABNORMAL HIGH (ref 8–23)
CO2: 20 mmol/L — ABNORMAL LOW (ref 22–32)
Calcium: 7.5 mg/dL — ABNORMAL LOW (ref 8.9–10.3)
Chloride: 105 mmol/L (ref 98–111)
Creatinine, Ser: 4 mg/dL — ABNORMAL HIGH (ref 0.44–1.00)
GFR, Estimated: 12 mL/min — ABNORMAL LOW (ref >60)
Glucose, Bld: 121 mg/dL — ABNORMAL HIGH (ref 70–99)
Phosphorus: 2.8 mg/dL (ref 2.5–4.6)
Potassium: 3.6 mmol/L (ref 3.5–5.1)
Sodium: 133 mmol/L — ABNORMAL LOW (ref 135–145)

## 2023-01-12 LAB — CULTURE, BLOOD (ROUTINE X 2)

## 2023-01-12 MED ORDER — SODIUM CHLORIDE 0.9 % IV SOLN: INTRAVENOUS | Status: AC

## 2023-01-12 NOTE — Progress Notes (Signed)
PROGRESS NOTE  [name] WGN:[id number] DOB: 1955-09-30 DOA: 01/10/2023 PCP: [name], DO   LOS: 2 days   Brief Narrative / Interim history: 67 year old female with CKD stage IV, Crohn's disease status post total colectomy with chronic high output ostomy, HTN comes into the hospital with abdominal pain, nausea, vomiting.  Her ostomy output has increased in the last several days, she has been having decreased appetite and felt like she was getting dehydrated.  She was found to have AKI, and underwent a CT scan of the abdomen and pelvis in the ER without acute findings.  Subjective / 24h Interval events: Feels a little bit stronger this morning  Assesement and Plan: Principal Problem:   AKI (acute kidney injury) (HCC) Active Problems:   Hyperlipidemia   GERD   History of Crohn's disease   CKD (chronic kidney disease) stage 4, GFR 15-29 ml/min (HCC)   Principal problem Acute kidney injury -baseline creatinine appears to be 2.7-3.1 per chart review, however patient tells me that most recently she was at 3.9.  She is seeing Dr. Arrie Aran as an outpatient.  Started fluids, continue for another day, creatinine in the 4 range still.  Active problems Crohn's disease status post colectomy with longstanding high output ostomy -patient used to be on cholestyramine as well as loperamide, now started on monthly octreotide.  She was also seeing Dr. Arrie Aran and used to get normal saline infusions couple of times a week to match her output.  Recently she has been trying to use the subcutaneous injections alone, no colestyramine, no loperamide, no IV fluids.  She did okay for the past couple of months, however her AC broke down in her home and has been exposed to higher temperatures, felt like she was losing more fluids and could not keep up with drinking fluids.  Currently, on cholestyramine and loperamide her output seems to be well-controlled.  Patient intends to follow-up with Dr.  Arrie Aran and perhaps get more fluids if needed as an outpatient.  For now continue fluids, she is still dry, if her creatinine goes less than 4 tomorrow we will plan for discharge  Elevated lipase -no clinical suspicion for pancreatitis, negative CT scan, no abdominal pain.  Tolerating diet  Essential hypertension -continue amlodipine  History of gout -continue albuterol  Anemia  -due to chronic disease  Leukocytosis -white count on admission was 23 K, improving with fluids alone  Hypokalemia - replace and monitor  Hyponatremia - continue fluids  Scheduled Meds:  allopurinol  150 mg Oral QHS   amLODipine  5 mg Oral Daily   cholestyramine light  4 g Oral TID   DULoxetine  120 mg Oral QHS   heparin  5,000 Units Subcutaneous Q8H   loperamide  4 mg Oral BID   metoprolol succinate  100 mg Oral QHS   Continuous Infusions:  sodium chloride 125 mL/hr at 01/12/23 0905   PRN Meds:.acetaminophen **OR** acetaminophen, ondansetron **OR** ondansetron (ZOFRAN) IV  Current Outpatient Medications  Medication Instructions   acetaminophen (TYLENOL) 1,000 mg, Oral, Every 6 hours PRN   allopurinol (ZYLOPRIM) 150 mg, Oral, Daily at bedtime   amLODipine (NORVASC) 5 mg, Oral, See admin instructions, Take 5 mg by mouth once a day and hold for a low B/P reading   Calcium Carbonate Antacid (TUMS EXTRA STRENGTH 750 PO) 1-2 tablets, Oral, Every 6 hours PRN   cholestyramine light (PREVALITE) 4 g, Oral, 3 times daily after meals   doxycycline (VIBRA-TABS) 100 mg, Oral,  2 times daily   DULoxetine (CYMBALTA) 60 MG capsule TAKE TWO CAPSULES (120 mg total) BY MOUTH DAILY AT 9AM   folic acid (FOLVITE) 2 mg, Oral, Daily   Gas-X Extra Strength 125 mg, Daily at bedtime   loperamide (IMODIUM A-D) 4 mg, 2 times daily   metoprolol succinate (TOPROL-XL) 100 mg, Oral, Daily at bedtime   OVER THE COUNTER MEDICATION 1 tablet, Oral, See admin instructions, Nervive Nerve Health tablet- Take 1 tablet by mouth at bedtime    Vitamin D (Ergocalciferol) (DRISDOL) 50,000 Units, Oral, Every 30 days    Diet Orders (From admission, onward)     Start     Ordered   01/12/23 0857  Diet regular Room service appropriate? Yes; Fluid consistency: Thin  Diet effective now       Question Answer Comment  Room service appropriate? Yes   Fluid consistency: Thin      01/12/23 0856            DVT prophylaxis: heparin injection 5,000 Units Start: 01/10/23 2200   Lab Results  Component Value Date   PLT 333 01/11/2023      Code Status: Full Code  Family Communication: no family at bedside   Status is: Inpatient  Remains inpatient appropriate because: severity of illness, AKI   Level of care: Telemetry  Consultants:  none  Objective: Vitals:   01/11/23 0939 01/11/23 1437 01/11/23 2128 01/12/23 0602  BP: 114/73 120/73 113/74 117/71  Pulse: (!) 103 (!) 101 (!) 102 90  Resp: 16 17 18 18   Temp: 98.1 F (36.7 C) 98.8 F (37.1 C) 98.4 F (36.9 C) 98.1 F (36.7 C)  TempSrc: Oral Oral Oral Oral  SpO2: 99% 94% 98% 96%  Weight:      Height:        Intake/Output Summary (Last 24 hours) at 01/12/2023 1242 Last data filed at 01/12/2023 0500 Gross per 24 hour  Intake 2293.33 ml  Output 950 ml  Net 1343.33 ml   Wt Readings from Last 3 Encounters:  01/10/23 86.2 kg  01/05/23 86.4 kg  12/08/22 86.6 kg    Examination:  Constitutional: NAD Eyes: no scleral icterus ENMT: Mucous membranes are moist.  Neck: normal, supple Respiratory: clear to auscultation bilaterally, no wheezing, no crackles. Normal respiratory effort. No accessory muscle use.  Cardiovascular: Regular rate and rhythm, no murmurs / rubs / gallops. No LE edema.  Abdomen: non distended, no tenderness. Bowel sounds positive.  Musculoskeletal: no clubbing / cyanosis.   Data Reviewed: I have independently reviewed following labs and imaging studies   CBC Recent Labs  Lab 01/10/23 1304 01/11/23 0533  WBC 23.5* 17.1*  HGB 13.1 10.3*   HCT 40.5 31.5*  PLT 444* 333  MCV 104.1* 101.9*  MCH 33.7 33.3  MCHC 32.3 32.7  RDW 16.1* 15.6*    Recent Labs  Lab 01/10/23 1304 01/11/23 0533 01/12/23 0540  NA 133* 131* 133*  K 4.2 3.4* 3.6  CL 104 95* 105  CO2 13* 22 20*  GLUCOSE 161* 152* 121*  BUN 61* 53* 52*  CREATININE 4.88* 4.83* 4.00*  CALCIUM 9.9 8.4* 7.5*  AST 28 20  --   ALT 29 19  --   ALKPHOS 125 91  --   BILITOT 0.6 0.6  --   ALBUMIN 4.2 3.4* 3.0*    ------------------------------------------------------------------------------------------------------------------ No results for input(s): "CHOL", "HDL", "LDLCALC", "TRIG", "CHOLHDL", "LDLDIRECT" in the last 72 hours.  Lab Results  Component Value Date   HGBA1C 5.6  08/23/2020   ------------------------------------------------------------------------------------------------------------------ No results for input(s): "TSH", "T4TOTAL", "T3FREE", "THYROIDAB" in the last 72 hours.  Invalid input(s): "FREET3"  Cardiac Enzymes No results for input(s): "CKMB", "TROPONINI", "MYOGLOBIN" in the last 168 hours.  Invalid input(s): "CK" ------------------------------------------------------------------------------------------------------------------ No results found for: "BNP"  CBG: No results for input(s): "GLUCAP" in the last 168 hours.  Recent Results (from the past 240 hour(s))  Culture, blood (Routine X 2) w Reflex to ID Panel     Status: None (Preliminary result)   Collection Time: 01/10/23  9:30 PM   Specimen: BLOOD  Result Value Ref Range Status   Specimen Description   Final    BLOOD BLOOD RIGHT ARM Performed at [facility], [address] [place], [place] [address]    Special Requests   Final    BOTTLES DRAWN AEROBIC ONLY Blood Culture results may not be optimal due to an inadequate volume of blood received in culture bottles Performed at [facility], [address], [place], [place] [address]     Culture   Final    NO GROWTH 2 DAYS Performed at [facility], [address] [place], [place] [address]    Report Status PENDING  Incomplete  Culture, blood (Routine X 2) w Reflex to ID Panel     Status: None (Preliminary result)   Collection Time: 01/10/23  9:30 PM   Specimen: BLOOD  Result Value Ref Range Status   Specimen Description   Final    BLOOD BLOOD RIGHT HAND Performed at [facility], [address] [place], [place] [address]    Special Requests   Final    BOTTLES DRAWN AEROBIC ONLY Blood Culture results may not be optimal due to an inadequate volume of blood received in culture bottles Performed at [facility], [address] [place], [place] [address]    Culture   Final    NO GROWTH 2 DAYS Performed at [facility], [address] [place], [place] [address]    Report Status PENDING  Incomplete  Gastrointestinal Panel by PCR , Stool     Status: None   Collection Time: 01/11/23  1:10 AM   Specimen: Stool  Result Value Ref Range Status   Campylobacter species NOT DETECTED NOT DETECTED Final   Plesimonas shigelloides NOT DETECTED NOT DETECTED Final   Salmonella species NOT DETECTED NOT DETECTED Final   Yersinia enterocolitica NOT DETECTED NOT DETECTED Final   Vibrio species NOT DETECTED NOT DETECTED Final   Vibrio cholerae NOT DETECTED NOT DETECTED Final   Enteroaggregative E coli (EAEC) NOT DETECTED NOT DETECTED Final   Enteropathogenic E coli (EPEC) NOT DETECTED NOT DETECTED Final   Enterotoxigenic E coli (ETEC) NOT DETECTED NOT DETECTED Final   Shiga like toxin producing E coli (STEC) NOT DETECTED NOT DETECTED Final   Shigella/Enteroinvasive E coli (EIEC) NOT DETECTED NOT DETECTED Final   Cryptosporidium NOT DETECTED NOT DETECTED Final   Cyclospora cayetanensis NOT DETECTED NOT DETECTED Final   Entamoeba histolytica NOT DETECTED NOT DETECTED Final   Giardia lamblia NOT DETECTED NOT DETECTED Final    Adenovirus F40/41 NOT DETECTED NOT DETECTED Final   Astrovirus NOT DETECTED NOT DETECTED Final   Norovirus GI/GII NOT DETECTED NOT DETECTED Final   Rotavirus A NOT DETECTED NOT DETECTED Final   Sapovirus (I, II, IV, and V) NOT DETECTED NOT DETECTED Final    Comment: Performed at [facility], [address] [place], [place] [address]  Radiology Studies: No results found.   Marzetta Board, MD, PhD [facility]  Between 7 am - 7 pm I am available, please contact me via Amion (for emergencies) or Securechat (non urgent messages)  Between 7 pm - 7 am I am not available, please contact night coverage MD/APP via Amion

## 2023-01-12 NOTE — TOC Initial Note (Signed)
Transition of Care [facility]) - Initial/Assessment Note    Patient Details  Name: [name] MRN: [id number] Date of Birth: 06-Aug-1956  Transition of Care [facility]) CM/SW Contact:    Durenda Guthrie, RN Phone Number: 01/12/2023, 10:38 AM  Clinical Narrative:                  Transition of Care [facility]) Department has reviewed patient and no TOC needs have been identified at this time. We will continue to monitor patient advancement through Interdisciplinary progressions and if new patient needs arise, please place a consult.       Patient Goals and CMS Choice            Expected Discharge Plan and Services                                              Prior Living Arrangements/Services                       Activities of Daily Living Home Assistive Devices/Equipment: None ADL Screening (condition at time of admission) Patient's cognitive ability adequate to safely complete daily activities?: Yes Is the patient deaf or have difficulty hearing?: No Does the patient have difficulty seeing, even when wearing glasses/contacts?: No Does the patient have difficulty concentrating, remembering, or making decisions?: No Patient able to express need for assistance with ADLs?: Yes Does the patient have difficulty dressing or bathing?: No Independently performs ADLs?: Yes (appropriate for developmental age) Does the patient have difficulty walking or climbing stairs?: No Weakness of Legs: None Weakness of Arms/Hands: None  Permission Sought/Granted                  Emotional Assessment              Admission diagnosis:  Generalized abdominal pain [R10.84] AKI (acute kidney injury) (HCC) [N17.9] Patient Active Problem List   Diagnosis Date Noted   Ileostomy care (HCC) 08/10/2022   AKI (acute kidney injury) (HCC) 07/16/2022   High output ileostomy (HCC) 07/16/2022   Nausea & vomiting 07/16/2022   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  07/08/2022   High anion gap metabolic acidosis 07/06/2022   GERD without esophagitis 07/06/2022   Orthostatic hypotension 07/06/2022   Ileostomy, has currently (HCC) 06/30/2022   Acute renal failure superimposed on stage 4 chronic kidney disease, unspecified acute renal failure type (HCC) 06/29/2022   Hypotension 06/29/2022   Sepsis secondary to UTI (HCC) 06/29/2022   Urinary frequency 03/05/2022   Neck pain 12/08/2021   Facial numbness 12/08/2021   Tooth abscess 11/10/2021   Surgical wound breakdown, initial encounter 05/27/2021   Crohn's disease of both small and large intestine (HCC) 05/20/2021   Acute kidney injury superimposed on chronic kidney disease (HCC) 05/14/2021   Thrombocytosis 05/14/2021   SIRS (systemic inflammatory response syndrome) (HCC) 05/14/2021   Hypochloremia 05/14/2021   Chronic pain of left knee 03/10/2021   Depression, major, single episode, severe (HCC) 12/18/2020   Severe sepsis (HCC) 12/07/2020   Acute pyelonephritis 12/07/2020   Closed fracture of left tibial plateau 10/30/2020   Screening for osteoporosis 10/30/2020   Diarrhea with dehydration 09/21/2020   Crohn's disease of jejunum (HCC) 08/23/2020   Chronic diarrhea 08/23/2020   Metabolic acidosis 08/23/2020   ARF (acute renal failure) (HCC) 08/23/2020   Acute renal failure  superimposed on stage 4 chronic kidney disease (HCC) 08/22/2020   Hyperglycemia 02/26/2020   Immunologic deficiency syndrome (HCC) 02/26/2020   Prothrombin gene mutation (HCC) 02/26/2020   Acute pain of left shoulder 06/04/2019   Pes anserine bursitis 04/12/2018   Pain of right thumb 04/12/2018   Low back pain 06/21/2017   Acute right-sided low back pain with right-sided sciatica 06/21/2017   Gout 06/21/2017   Superficial thrombophlebitis of right upper extremity 03/23/2017   Acute basilic vein thrombosis, right 02/16/2017   Symptomatic anemia 01/15/2017   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   Rectal pain 12/01/2015   IBD  (inflammatory bowel disease) 10/24/2015   Anal stricture 10/24/2015   Gout of foot 10/29/2014   Ovarian cyst, right 06/12/2014   Acute on chronic renal failure (HCC) 04/02/2013   Hydrosalpinx 02/14/2013   Hyperparathyroidism (HCC) 09/18/2012   HSV (herpes simplex virus) anogenital infection 06/28/2012   CKD (chronic kidney disease) stage 4, GFR 15-29 ml/min (HCC) 06/17/2012   LLQ abdominal pain 06/22/2011   RESTLESS LEGS SYNDROME 08/05/2009   PULMONARY NODULE, RIGHT UPPER LOBE 02/28/2009   TOBACCO ABUSE, HX OF 02/11/2009   PAIN IN JOINT, MULTIPLE SITES 02/04/2009   Nonspecific (abnormal) findings on radiological and other examination of body structure 12/23/2008   CT, CHEST, ABNORMAL 12/23/2008   HEMANGIOMA SIMPLEX 12/16/2008   Pain in joint, pelvic region and thigh 12/16/2008   SKIN RASH 12/16/2008   TINEA CORPORIS 04/18/2008   LATERAL EPICONDYLITIS, RIGHT 04/18/2008   Morbid obesity (HCC) 02/05/2008   ANEMIA 02/05/2008   ANAL STENOSIS 02/05/2008   Stool culture positive for Clostridioides difficile 02/05/2008   Personal history of other endocrine, metabolic, and immunity disorders 02/05/2008   History of Crohn's disease 02/05/2008   Syncope and collapse 02/02/2008   Hyponatremia 12/15/2007   Dehydration 12/15/2007   HYPOKALEMIA 12/15/2007   ANEMIA-IRON DEFICIENCY 12/15/2007   LEUKOCYTOSIS 12/15/2007   Anxiety state 12/15/2007   Disorder resulting from impaired renal function 11/16/2007   TINNITUS NOS 05/25/2007   DE QUERVAIN'S TENOSYNOVITIS 05/25/2007   Hyperlipidemia 10/23/2006   Depression 10/23/2006   Essential hypertension 10/23/2006   GERD 10/23/2006   Gastro-esophageal reflux disease without esophagitis 10/23/2006   Ulcerative colitis without complications (HCC) 07/28/2004   GASTRIC POLYP 11/16/2001   GERD with esophagitis 11/16/2001   PCP:  [name], DO Pharmacy:   [id number] - [place], [place] - [address] [place]  [address] Phone: [phone] Fax: [phone]  [facility] - [place] - [address] [place] [address] Phone: [phone] Fax: [phone]  [id number] - [place], [place] - [address] [place] [address] Phone: [phone] Fax: [phone]     Social Determinants of Health (SDOH) Social History: SDOH Screenings   Food Insecurity: No Food Insecurity (01/12/2023)  Housing: Low Risk  (01/12/2023)  Transportation Needs: No Transportation Needs (01/12/2023)  Utilities: Not At Risk (01/12/2023)  Alcohol Screen: Low Risk  (05/26/2022)  Depression (PHQ2-9): Low Risk  (08/03/2022)  Financial Resource Strain: Low Risk  (05/26/2022)  Physical Activity: Inactive (05/26/2022)  Social Connections: Moderately Isolated (05/26/2022)  Stress: No Stress Concern Present (05/26/2022)  Tobacco Use: Medium Risk (01/10/2023)   SDOH Interventions:     Readmission Risk Interventions    05/15/2021    1:42 PM 12/10/2020   10:57 AM  Readmission Risk Prevention Plan  Transportation Screening Complete Complete  PCP or Specialist Appt  within 3-5 Days  Complete  HRI or Home Care Consult Complete Complete  Social Work Consult for Recovery Care Planning/Counseling Complete Complete  Palliative Care Screening Not Applicable Not Applicable  Medication Review Oceanographer)  Complete

## 2023-01-13 ENCOUNTER — Other Ambulatory Visit: Payer: Self-pay

## 2023-01-13 DIAGNOSIS — N179 Acute kidney failure, unspecified: Secondary | ICD-10-CM | POA: Diagnosis not present

## 2023-01-13 LAB — CBC
HCT: 28.8 % — ABNORMAL LOW (ref 36.0–46.0)
Hemoglobin: 9.1 g/dL — ABNORMAL LOW (ref 12.0–15.0)
MCH: 33.8 pg (ref 26.0–34.0)
MCHC: 31.6 g/dL (ref 30.0–36.0)
MCV: 107.1 fL — ABNORMAL HIGH (ref 80.0–100.0)
Platelets: 269 10*3/uL (ref 150–400)
RBC: 2.69 MIL/uL — ABNORMAL LOW (ref 3.87–5.11)
RDW: 15.6 % — ABNORMAL HIGH (ref 11.5–15.5)
WBC: 12.4 10*3/uL — ABNORMAL HIGH (ref 4.0–10.5)
nRBC: 0.3 % — ABNORMAL HIGH (ref 0.0–0.2)

## 2023-01-13 LAB — BASIC METABOLIC PANEL
Anion gap: 8 (ref 5–15)
BUN: 47 mg/dL — ABNORMAL HIGH (ref 8–23)
CO2: 20 mmol/L — ABNORMAL LOW (ref 22–32)
Calcium: 7.4 mg/dL — ABNORMAL LOW (ref 8.9–10.3)
Chloride: 109 mmol/L (ref 98–111)
Creatinine, Ser: 3.57 mg/dL — ABNORMAL HIGH (ref 0.44–1.00)
GFR, Estimated: 13 mL/min — ABNORMAL LOW (ref >60)
Glucose, Bld: 128 mg/dL — ABNORMAL HIGH (ref 70–99)
Potassium: 3.6 mmol/L (ref 3.5–5.1)
Sodium: 137 mmol/L (ref 135–145)

## 2023-01-13 LAB — CULTURE, BLOOD (ROUTINE X 2): Culture: NO GROWTH

## 2023-01-13 NOTE — Progress Notes (Signed)
Discharge instructions reviewed with pt, all questions answered.  IV removed and pt awaiting ride.

## 2023-01-13 NOTE — Discharge Summary (Signed)
Physician Discharge Summary  [name] ZOX:[id number] DOB: November 24, 1955 DOA: 01/10/2023  PCP: [name], DO  Admit date: 01/10/2023 Discharge date: 01/13/2023  Admitted From: home Disposition:  home  Recommendations for Outpatient Follow-up:  Follow up with Dr Arrie Aran in 1 week Please obtain BMP/CBC in one week  Home Health: none Equipment/Devices: none  Discharge Condition: stable CODE STATUS: Full code Diet Orders (From admission, onward)     Start     Ordered   01/12/23 0857  Diet regular Room service appropriate? Yes; Fluid consistency: Thin  Diet effective now       Question Answer Comment  Room service appropriate? Yes   Fluid consistency: Thin      01/12/23 0856            HPI: Per admitting MD, [name] is a 67 y.o. 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 Per patient-she was in her usual state of health approximately 3 days back-she has longstanding history of Crohn's disease-and has high output ostomy-and has to change her ostomy 3-5 times on a daily basis.  She gets long-acting octreotide injections every month (last dose apparently was on 5/1).  She no longer is taking Questran and Imodium-as the octreotide has helped maintain her output.  She follows closely with Dr. Adela Lank from [facility]ology and Dr. Arrie Aran from [facility]. Approximately 3 days back-she started developing abdominal pain-that she did describes as mostly cramps.  These occurred intermittently-and have by enlarge resolved today.  She is also had around 3-4 episodes of nonbloody-nonbloody bilious vomiting.  She has noted that her ostomy output has increased markedly-she now has to change her ostomy bag-6-7 times.  She is also had significantly decreased appetite these past 3 days.  She complains of  subjective fever-sweats-but she never checked her temperature. She does not have any sick contacts.  There is no cough, headache, dysuria. Because of the persistent nature of her symptoms-she was evaluated in the ED-and was found to have AKI on CKD 4, CT abdomen did not show any acute issues.  The hospitalist service was then asked to admit this patient for further evaluation and treatment.  Hospital Course / Discharge diagnoses: Principal Problem:   AKI (acute kidney injury) (HCC) Active Problems:   Hyperlipidemia   GERD   History of Crohn's disease   CKD (chronic kidney disease) stage 4, GFR 15-29 ml/min (HCC)   Principal problem Acute kidney injury on CKD 4  -baseline creatinine appears to be 2.7-3.1 per chart review, however patient tells me that most recently she was at 3.9.  She is seeing Dr. Arrie Aran as an outpatient.  Started fluids, continue for another day, creatinine in the 4 range still.   Active problems Crohn's disease status post colectomy with longstanding high output ostomy -patient used to be on cholestyramine as well as loperamide, now started on monthly octreotide.  She was also seeing Dr. Arrie Aran and used to get normal saline infusions couple of times a week to match her output.  Recently she has been trying to use the subcutaneous injections alone, no colestyramine, no loperamide, no IV fluids.  She did okay for the past couple of months, however her AC broke down in her home and has been exposed to higher temperatures, felt like she was losing more fluids and could not keep up with  drinking fluids.  Currently, on cholestyramine and loperamide in the hospital her output seems to be well-controlled.  Patient intends to follow-up with Dr. Arrie Aran and perhaps get more fluids if needed as an outpatient.  With decreasing ostomy output and IV fluids her creatinine has returned to her baseline and is 3.5 upon discharge.  Elevated lipase -no clinical suspicion for  pancreatitis, negative CT scan, no abdominal pain.  Tolerating diet Essential hypertension -continue amlodipine History of gout -continue albuterol Anemia  -due to chronic disease Leukocytosis -white count on admission was 23 K, improving with fluids alone Hypokalemia - replaced Hyponatremia - continue fluids  Sepsis ruled out   Discharge Instructions   Allergies as of 01/13/2023       Reactions   Sulfa Antibiotics Hives, Itching, Rash, Swelling   Morphine Other (See Comments)   "Gives me crazy dreams" (delusions, also) Psychosis, also   Sulfonamide Derivatives Hives, Itching, Swelling        Medication List     STOP taking these medications    doxycycline 100 MG tablet Commonly known as: VIBRA-TABS       TAKE these medications    acetaminophen 500 MG tablet Commonly known as: TYLENOL Take 1,000 mg by mouth every 6 (six) hours as needed (for pain/sleep).   allopurinol 300 MG tablet Commonly known as: ZYLOPRIM Take 0.5 tablets (150 mg total) by mouth at bedtime. What changed: how much to take   amLODipine 5 MG tablet Commonly known as: NORVASC Take 5 mg by mouth See admin instructions. Take 5 mg by mouth once a day and hold for a low B/P reading   cholestyramine light 4 g packet Commonly known as: PREVALITE Take 1 packet (4 g total) by mouth 3 (three) times daily after meals.   DULoxetine 60 MG capsule Commonly known as: CYMBALTA TAKE TWO CAPSULES (120 mg total) BY MOUTH DAILY AT 9AM What changed: See the new instructions.   folic acid 1 MG tablet Commonly known as: FOLVITE Take 2 tablets (2 mg total) by mouth daily. What changed: how much to take   Gas-X Extra Strength 125 MG Caps Generic drug: Simethicone Take 125 mg by mouth at bedtime.   loperamide 2 MG tablet Commonly known as: IMODIUM A-D Take 4 mg by mouth in the morning and at bedtime.   metoprolol succinate 100 MG 24 hr tablet Commonly known as: TOPROL-XL Take 100 mg by mouth at  bedtime.   OVER THE COUNTER MEDICATION Take 1 tablet by mouth See admin instructions. Nervive Nerve Health tablet- Take 1 tablet by mouth at bedtime   TUMS EXTRA STRENGTH 750 PO Take 1-2 tablets by mouth every 6 (six) hours as needed (CHEW for heartburn).   Vitamin D (Ergocalciferol) 1.25 MG (50000 UNIT) Caps capsule Commonly known as: DRISDOL Take 50,000 Units by mouth every 30 (thirty) days.       Consultations: none  Procedures/Studies:  DG Chest Port 1V same Day  Result Date: 01/10/2023 CLINICAL DATA:  Shortness of breath EXAM: PORTABLE CHEST 1 VIEW COMPARISON:  Chest x-ray 06/29/2022 FINDINGS: The heart size and mediastinal contours are within normal limits. Both lungs are clear. The visualized skeletal structures are unremarkable. IMPRESSION: No active disease. Electronically Signed   By: Darliss Cheney M.D.   On: 01/10/2023 19:08   CT ABDOMEN PELVIS WO CONTRAST  Result Date: 01/10/2023 CLINICAL DATA:  History of Crohn's disease status post total colectomy and ileostomy with abdominal pain, nausea, and vomiting EXAM: CT ABDOMEN AND PELVIS WITHOUT  CONTRAST TECHNIQUE: Multidetector CT imaging of the abdomen and pelvis was performed following the standard protocol without IV contrast. RADIATION DOSE REDUCTION: This exam was performed according to the departmental dose-optimization program which includes automated exposure control, adjustment of the mA and/or kV according to patient size and/or use of iterative reconstruction technique. COMPARISON:  CT abdomen and pelvis dated 06/29/2022 FINDINGS: Lower chest: Bilateral lower lobe subpleural band like scarring. No pleural effusion or pneumothorax demonstrated. Partially imaged heart size is normal. Hepatobiliary: No focal hepatic lesions. No intra or extrahepatic biliary ductal dilation. Cholecystectomy. Pancreas: No focal lesions or main ductal dilation. Spleen: Normal in size without focal abnormality. Adrenals/Urinary Tract: No adrenal  nodules. No suspicious renal mass or hydronephrosis. Punctate nonobstructing left upper pole stone. No focal bladder wall thickening. Stomach/Bowel: Normal appearance of the stomach. Postsurgical changes from total colectomy and right hemi abdominal ileostomy. 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 No evidence of bowel wall thickening, distention, or inflammatory changes. Normal appendix. Vascular/Lymphatic: Aortic atherosclerosis. No enlarged abdominal or pelvic lymph nodes. Reproductive: Unchanged right adnexal 2.7 x 2.0 cm simple fluid attenuation focus (2:65), possibly simple ovarian/paraovarian cyst. Other: No free fluid, fluid collection, or free air. Musculoskeletal: No acute or abnormal lytic or blastic osseous lesions. Postsurgical changes of the anterior abdominal wall. IMPRESSION: 1. Postsurgical changes from total colectomy and right hemi abdominal ileostomy. 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 Otherwise, no acute infectious/inflammatory findings in the abdomen or pelvis. 2. Punctate nonobstructing left upper pole stone. 3.  Aortic Atherosclerosis (ICD10-I70.0). Electronically Signed   By: Agustin Cree M.D.   On: 01/10/2023 17:24    Subjective: - no chest pain, shortness of breath, no abdominal pain, nausea or vomiting.   Discharge Exam: BP 111/67 (BP Location: Left Arm)   Pulse 89   Temp 97.8 F (36.6 C) (Oral)   Resp 18   Ht 5\' 3"  (1.6 m)   Wt 86.2 kg   SpO2 98%   BMI 33.66 kg/m   General: Pt is alert, awake, not in acute distress Cardiovascular: RRR, S1/S2 +, no rubs, no gallops Respiratory: CTA bilaterally, no wheezing, no rhonchi Abdominal: Soft, NT, ND, bowel sounds + Extremities: no edema, no cyanosis  The results of significant diagnostics from this hospitalization (including imaging, microbiology, ancillary and laboratory) are listed below for reference.     Microbiology: Recent Results (from the past 240  hour(s))  Culture, blood (Routine X 2) w Reflex to ID Panel     Status: None (Preliminary result)   Collection Time: 01/10/23  9:30 PM   Specimen: BLOOD  Result Value Ref Range Status   Specimen Description   Final    BLOOD BLOOD RIGHT ARM Performed at [facility], [address], [place], [place] [address]    Special Requests   Final    BOTTLES DRAWN AEROBIC ONLY Blood Culture results may not be optimal due to an inadequate volume of blood received in culture bottles Performed at [facility], [address] [place], [place] [address]    Culture   Final    NO GROWTH 3 DAYS Performed at [facility], [address] [place], [place] [address]    Report Status PENDING  Incomplete  Culture, blood (Routine X 2) w Reflex to ID Panel     Status: None (Preliminary result)   Collection Time: 01/10/23  9:30 PM   Specimen: BLOOD  Result Value Ref Range Status  Specimen Description   Final    BLOOD BLOOD RIGHT HAND Performed at [facility], [address], [place], [place] [address]    Special Requests   Final    BOTTLES DRAWN AEROBIC ONLY Blood Culture results may not be optimal due to an inadequate volume of blood received in culture bottles Performed at [facility], [address], [place], [place] [address]    Culture   Final    NO GROWTH 3 DAYS Performed at [facility], [address] [place], [place] [address]    Report Status PENDING  Incomplete  Gastrointestinal Panel by PCR , Stool     Status: None   Collection Time: 01/11/23  1:10 AM   Specimen: Stool  Result Value Ref Range Status   Campylobacter species NOT DETECTED NOT DETECTED Final   Plesimonas shigelloides NOT DETECTED NOT DETECTED Final   Salmonella species NOT DETECTED NOT DETECTED Final   Yersinia enterocolitica NOT DETECTED NOT DETECTED Final   Vibrio species NOT DETECTED NOT DETECTED Final   Vibrio cholerae NOT  DETECTED NOT DETECTED Final   Enteroaggregative E coli (EAEC) NOT DETECTED NOT DETECTED Final   Enteropathogenic E coli (EPEC) NOT DETECTED NOT DETECTED Final   Enterotoxigenic E coli (ETEC) NOT DETECTED NOT DETECTED Final   Shiga like toxin producing E coli (STEC) NOT DETECTED NOT DETECTED Final   Shigella/Enteroinvasive E coli (EIEC) NOT DETECTED NOT DETECTED Final   Cryptosporidium NOT DETECTED NOT DETECTED Final   Cyclospora cayetanensis NOT DETECTED NOT DETECTED Final   Entamoeba histolytica NOT DETECTED NOT DETECTED Final   Giardia lamblia NOT DETECTED NOT DETECTED Final   Adenovirus F40/41 NOT DETECTED NOT DETECTED Final   Astrovirus NOT DETECTED NOT DETECTED Final   Norovirus GI/GII NOT DETECTED NOT DETECTED Final   Rotavirus A NOT DETECTED NOT DETECTED Final   Sapovirus (I, II, IV, and V) NOT DETECTED NOT DETECTED Final    Comment: Performed at [facility], [address] [place], [place] [address]     Labs: Basic Metabolic Panel: Recent Labs  Lab 01/10/23 1304 01/11/23 0533 01/12/23 0540 01/13/23 0545  NA 133* 131* 133* 137  K 4.2 3.4* 3.6 3.6  CL 104 95* 105 109  CO2 13* 22 20* 20*  GLUCOSE 161* 152* 121* 128*  BUN 61* 53* 52* 47*  CREATININE 4.88* 4.83* 4.00* 3.57*  CALCIUM 9.9 8.4* 7.5* 7.4*  PHOS  --   --  2.8  --    Liver Function Tests: Recent Labs  Lab 01/10/23 1304 01/11/23 0533 01/12/23 0540  AST 28 20  --   ALT 29 19  --   ALKPHOS 125 91  --   BILITOT 0.6 0.6  --   PROT 9.3* 6.9  --   ALBUMIN 4.2 3.4* 3.0*   CBC: Recent Labs  Lab 01/10/23 1304 01/11/23 0533 01/13/23 0545  WBC 23.5* 17.1* 12.4*  HGB 13.1 10.3* 9.1*  HCT 40.5 31.5* 28.8*  MCV 104.1* 101.9* 107.1*  PLT 444* 333 269   CBG: No results for input(s): "GLUCAP" in the last 168 hours. Hgb A1c No results for input(s): "HGBA1C" in the last 72 hours. Lipid Profile No results for input(s): "CHOL", "HDL", "LDLCALC", "TRIG", "CHOLHDL", "LDLDIRECT" in the last 72  hours. Thyroid function studies No results for input(s): "TSH", "T4TOTAL", "T3FREE", "THYROIDAB" in the last 72 hours.  Invalid input(s): "FREET3" Urinalysis    Component Value Date/Time   COLORURINE YELLOW 01/10/2023 0110  APPEARANCEUR CLOUDY (A) 01/10/2023 0110   LABSPEC 1.015 01/10/2023 0110   PHURINE 6.0 01/10/2023 0110   GLUCOSEU NEGATIVE 01/10/2023 0110   GLUCOSEU NEGATIVE 03/14/2009 1603   HGBUR SMALL (A) 01/10/2023 0110   BILIRUBINUR NEGATIVE 01/10/2023 0110   BILIRUBINUR negative 03/05/2022 1121   KETONESUR NEGATIVE 01/10/2023 0110   PROTEINUR 100 (A) 01/10/2023 0110   UROBILINOGEN 0.2 03/05/2022 1121   UROBILINOGEN 0.2 04/12/2013 0900   NITRITE NEGATIVE 01/10/2023 0110   LEUKOCYTESUR LARGE (A) 01/10/2023 0110    FURTHER DISCHARGE INSTRUCTIONS:   Get Medicines reviewed and adjusted: Please take all your medications with you for your next visit with your Primary MD   Laboratory/radiological data: Please request your Primary MD to go over all hospital tests and procedure/radiological results at the follow up, please ask your Primary MD to get all Hospital records sent to his/her office.   In some cases, they will be blood work, cultures and biopsy results pending at the time of your discharge. Please request that your primary care M.D. goes through all the records of your hospital data and follows up on these results.   Also Note the following: If you experience worsening of your admission symptoms, develop shortness of breath, life threatening emergency, suicidal or homicidal thoughts you must seek medical attention immediately by calling 911 or calling your MD immediately  if symptoms less severe.   You must read complete instructions/literature along with all the possible adverse reactions/side effects for all the Medicines you take and that have been prescribed to you. Take any new Medicines after you have completely understood and accpet all the possible adverse  reactions/side effects.    Do not drive when taking Pain medications or sleeping medications (Benzodaizepines)   Do not take more than prescribed Pain, Sleep and Anxiety Medications. It is not advisable to combine anxiety,sleep and pain medications without talking with your primary care practitioner   Special Instructions: If you have smoked or chewed Tobacco  in the last 2 yrs please stop smoking, stop any regular Alcohol  and or any Recreational drug use.   Wear Seat belts while driving.   Please note: You were cared for by a hospitalist during your hospital stay. Once you are discharged, your primary care physician will handle any further medical issues. Please note that NO REFILLS for any discharge medications will be authorized once you are discharged, as it is imperative that you return to your primary care physician (or establish a relationship with a primary care physician if you do not have one) for your post hospital discharge needs so that they can reassess your need for medications and monitor your lab values.  Time coordinating discharge: 35 minutes  SIGNED:  Pamella Pert, MD, PhD 01/13/2023, 9:08 AM

## 2023-01-13 NOTE — Consult Note (Signed)
   [facility] Inpatient Consult   01/13/2023  [name] March 16, 1956 [id number]  [facility] [[facility]]  Accountable Care Organization [ACO] Patient: [name] Medicare   *[facility] Liaison remote coverage review for patient admitted to [facility]    Primary Care Provider: Zola Button, Grayling Congress, DO with [facility] which is listed to provide the transition of care follow up  Patient evaluated for community based chronic complex disease management services with [facility] Care Management Program as a benefit of patient's [employer]. Call attempted to explain [facility] Care Management services currently patient was not able to be reached or leave a voicemail message.  Plan:  Will make a referral request for [facility] Care Coordination team for post hospital outreach for readmission prevention follow up.     Of note, [facility] Care Management services does not replace or interfere with any services that are arranged by inpatient case management or social work.  For additional questions or referrals please contact:    Charlesetta Shanks, RN BSN CCM [facility]  [phone] business mobile phone Toll free office [phone]  *Concierge Line  [phone] Fax number: [phone] [name]

## 2023-01-14 ENCOUNTER — Telehealth: Payer: Self-pay | Admitting: *Deleted

## 2023-01-14 ENCOUNTER — Encounter: Payer: Self-pay | Admitting: *Deleted

## 2023-01-14 LAB — CULTURE, BLOOD (ROUTINE X 2): Culture: NO GROWTH

## 2023-01-14 NOTE — Transitions of Care (Post Inpatient/ED Visit) (Signed)
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  Thanks for getting these appointments set up for me")  Items Reviewed: Did you receive and understand the discharge instructions provided?: Yes (thoroughly reviewed with patient who verbalizes good understanding of same) Medications obtained,verified, and reconciled?: Yes (Medications Reviewed) (Full medication reconciliation/ review completed; no concerns or discrepancies identified; confirmed patient obtained/ is taking all newly Rx'd medications as instructed; self-manages medications and denies questions/ concerns around medications today) Any new allergies since your discharge?: No Dietary orders reviewed?: Yes Type of Diet Ordered:: "I just watch what I eat for my Crohns disease-- been doing this diet for a very long time now" Do you have support at home?: Yes People in Home: significant other Name of Support/Comfort Primary Source: Reports independent in self-care activities; significant other assists as/ if needed/ indicated  Medications Reviewed Today: Medications Reviewed Today     Reviewed by Michaela Corner, RN (Registered Nurse) on 01/14/23 at 1310  Med List Status: <None>   Medication Order Taking? Sig Documenting Provider Last Dose Status Informant  acetaminophen (TYLENOL) 500 MG tablet [id number] Yes Take 1,000 mg by mouth every 6 (six) hours as needed (for pain/sleep).  [provider] Taking Active Self           Med Note Sharlynn Oliphant Jan 10, 2023  8:51 PM)    allopurinol (ZYLOPRIM) 300 MG tablet [id number] Yes Take 0.5 tablets (150 mg total) by mouth at bedtime.  Patient taking differently: Take 300 mg by mouth at bedtime.   [name], DO Taking Active Self  amLODipine (NORVASC) 5 MG tablet [id number] Yes Take 5 mg by mouth See admin instructions. Take 5 mg by mouth once a day and hold for a low B/P reading [provider] Taking Active Self  Calcium Carbonate Antacid (TUMS EXTRA STRENGTH 750 PO) [id number] Yes Take 1-2 tablets by mouth every 6 (six) hours as needed (CHEW for heartburn). [provider] Taking Active Self  cholestyramine light (PREVALITE) 4 g packet [id number] Yes Take 1 packet (4 g total) by mouth 3 (three) times daily after meals. Willeen Niece, MD Taking Active Self  DULoxetine (CYMBALTA) 60 MG capsule [id number] Yes TAKE TWO CAPSULES (120 mg total) BY MOUTH DAILY AT 9AM  Patient taking differently: Take 120 mg by mouth at bedtime.   [name], DO Taking Active Self  folic acid (FOLVITE) 1 MG tablet [id number] Yes Take 2 tablets (2 mg total) by mouth daily.  Patient taking differently: Take 1 mg by mouth daily.   Josph Macho, MD Taking Active Self           Med Note (CRUTHIS, Marcy Siren   Wed Jun 30, 2022  8:01 AM)    GAS-X EXTRA STRENGTH 125 MG CAPS [id number] No Take 125 mg by mouth at bedtime.  Patient not taking: Reported on 01/14/2023   [provider] Not Taking Active Self  loperamide (IMODIUM A-D) 2 MG tablet [id number] Yes Take 4 mg by mouth in the morning and at bedtime. [provider] Taking Active Self  metoprolol succinate (TOPROL-XL) 100 MG 24 hr tablet [id number] Yes Take 100 mg by mouth at bedtime. [provider] Taking Active Self           Med Note Malachy Moan Jul 06, 2022  9:38 PM)    OVER THE COUNTER MEDICATION [id number] Yes  Take 1 tablet by mouth See admin instructions. Nervive Nerve Health tablet- Take 1 tablet by mouth at bedtime [provider] Taking Active Self  Vitamin D, Ergocalciferol, (DRISDOL) 1.25 MG (50000 UNIT) CAPS capsule [id number] Yes Take 50,000 Units by mouth every 30 (thirty) days. [provider] Taking Active Self           Med Note (CRUTHIS, CHLOE C   Wed Jun 30, 2022  8:00 AM)              Home Care and Equipment/Supplies: Were Home Health Services Ordered?: No Any new equipment or medical supplies ordered?: No  Functional Questionnaire: Do you need assistance with bathing/showering or dressing?: No Do you need assistance with meal preparation?: No Do you need assistance with eating?: No Do you have difficulty maintaining continence: No Do you need assistance with getting out of bed/getting out of a chair/moving?: No Do you have difficulty managing or taking your medications?: No  Follow up appointments reviewed: PCP Follow-up appointment confirmed?: Yes (care coordination outreach in real-time with scheduling care guide to successfully schedule hospital follow up PCP appointment) Date of PCP follow-up appointment?: 01/21/23 Follow-up Provider: PCP Specialist Hospital Follow-up appointment confirmed?: Yes Date of Specialist follow-up appointment?: 01/18/23 Follow-Up Specialty Provider:: renal- Dr. Arrie Aran Do you need transportation to your follow-up appointment?: No Do you understand care options if your condition(s) worsen?: Yes-patient verbalized understanding  SDOH Interventions Today    Flowsheet Row Most Recent Value  SDOH Interventions   Food Insecurity Interventions Intervention Not Indicated  Transportation Interventions Intervention Not Indicated  [drives self]      TOC Interventions Today    Flowsheet Row Most Recent Value  TOC Interventions   TOC Interventions Discussed/Reviewed TOC Interventions Discussed, Arranged PCP follow up less  than 12 days/Care Guide scheduled      Interventions Today    Flowsheet Row Most Recent Value  Chronic Disease   Chronic disease during today's visit Other, Chronic Kidney Disease/End Stage Renal Disease (ESRD)  [abdominal pain- N/V with AKI]  General Interventions   General Interventions Discussed/Reviewed General Interventions Discussed, Doctor Visits, Communication with, Referral to Nurse, Durable Medical Equipment (DME)  Doctor Visits Discussed/Reviewed Specialist, Doctor Visits Discussed, PCP  Durable Medical Equipment (DME) Other  [reports currently does not require use of assistive devices]  PCP/Specialist Visits Compliance with follow-up visit  Communication with RN  Nutrition Interventions   Nutrition Discussed/Reviewed Nutrition Discussed  Pharmacy Interventions   Pharmacy Dicussed/Reviewed Pharmacy Topics Discussed  [Full medication review with updating medication list in EHR per patient report]      Caryl Pina, RN, BSN, CCRN Alumnus RN CM Care Coordination/ Transition of Care- [facility] Care Management [phone]: direct office

## 2023-01-14 NOTE — Progress Notes (Signed)
  Care Coordination  Outreach Note  01/14/2023 Name: [name] MRN: [id number] DOB: 05-31-1956   Care Coordination Outreach Attempts: An unsuccessful telephone outreach was attempted today to offer the patient information about available care coordination services.  Follow Up Plan:  Additional outreach attempts will be made to offer the patient care coordination information and services.   Encounter Outcome:  No Answer  Burman Nieves, CCMA Care Coordination Care Guide Direct Dial: [phone]

## 2023-01-14 NOTE — Progress Notes (Signed)
  Care Coordination   Note   01/14/2023 Name: [name] MRN: [id number] DOB: 11/30/1955  [name] WREATHA JONG is a 67 y.o. year old female who sees Zola Button, Grayling Congress, DO for primary care. I reached out to [name] by phone today to offer care coordination services.  Ms. Zaruba was given information about Care Coordination services today including:   The Care Coordination services include support from the care team which includes your Nurse Coordinator, Clinical Social Worker, or Pharmacist.  The Care Coordination team is here to help remove barriers to the health concerns and goals most important to you. Care Coordination services are voluntary, and the patient may decline or stop services at any time by request to their care team member.   Care Coordination Consent Status: Patient agreed to services and verbal consent obtained.   Follow up plan:  Telephone appointment with care coordination team member scheduled for:  01/25/2023  Encounter Outcome:  Pt. Scheduled from referral   Burman Nieves, [facility] Care Coordination Care Guide Direct Dial: [phone]

## 2023-01-15 LAB — CULTURE, BLOOD (ROUTINE X 2)

## 2023-01-17 ENCOUNTER — Ambulatory Visit (INDEPENDENT_AMBULATORY_CARE_PROVIDER_SITE_OTHER): Payer: Medicare Other

## 2023-01-17 VITALS — BP 96/64 | HR 67 | Temp 97.5°F | Resp 18 | Ht 63.0 in | Wt 188.6 lb

## 2023-01-17 DIAGNOSIS — D649 Anemia, unspecified: Secondary | ICD-10-CM

## 2023-01-17 DIAGNOSIS — E860 Dehydration: Secondary | ICD-10-CM

## 2023-01-17 DIAGNOSIS — K50819 Crohn's disease of both small and large intestine with unspecified complications: Secondary | ICD-10-CM

## 2023-01-17 DIAGNOSIS — R198 Other specified symptoms and signs involving the digestive system and abdomen: Secondary | ICD-10-CM

## 2023-01-17 MED ORDER — SODIUM CHLORIDE 0.9 % IV BOLUS
1000.0000 mL | Freq: Once | INTRAVENOUS | Status: AC
  Administered 2023-01-17: 1000 mL via INTRAVENOUS

## 2023-01-17 NOTE — Progress Notes (Signed)
Diagnosis: Dehydration  Provider:  Chilton Greathouse MD  Procedure: IV Infusion  IV Type: Peripheral, IV Location: R Forearm  Normal Saline, Dose: 1000 ml  Infusion Start Time: 0837  Infusion Stop Time: 0946  Post Infusion IV Care: Peripheral IV Discontinued  Discharge: Condition: Good, Destination: Home . AVS Declined  Performed by:  Loney Hering, LPN

## 2023-01-18 DIAGNOSIS — E871 Hypo-osmolality and hyponatremia: Secondary | ICD-10-CM | POA: Diagnosis not present

## 2023-01-18 DIAGNOSIS — I129 Hypertensive chronic kidney disease with stage 1 through stage 4 chronic kidney disease, or unspecified chronic kidney disease: Secondary | ICD-10-CM | POA: Diagnosis not present

## 2023-01-18 DIAGNOSIS — D631 Anemia in chronic kidney disease: Secondary | ICD-10-CM | POA: Diagnosis not present

## 2023-01-18 DIAGNOSIS — D509 Iron deficiency anemia, unspecified: Secondary | ICD-10-CM | POA: Diagnosis not present

## 2023-01-18 DIAGNOSIS — E876 Hypokalemia: Secondary | ICD-10-CM | POA: Diagnosis not present

## 2023-01-18 DIAGNOSIS — N184 Chronic kidney disease, stage 4 (severe): Secondary | ICD-10-CM | POA: Diagnosis not present

## 2023-01-18 DIAGNOSIS — M109 Gout, unspecified: Secondary | ICD-10-CM | POA: Diagnosis not present

## 2023-01-18 DIAGNOSIS — R768 Other specified abnormal immunological findings in serum: Secondary | ICD-10-CM | POA: Diagnosis not present

## 2023-01-18 DIAGNOSIS — E88819 Insulin resistance, unspecified: Secondary | ICD-10-CM | POA: Diagnosis not present

## 2023-01-18 DIAGNOSIS — N179 Acute kidney failure, unspecified: Secondary | ICD-10-CM | POA: Diagnosis not present

## 2023-01-18 DIAGNOSIS — N2581 Secondary hyperparathyroidism of renal origin: Secondary | ICD-10-CM | POA: Diagnosis not present

## 2023-01-19 LAB — LAB REPORT - SCANNED
Creatinine, POC: 102.1 mg/dL
EGFR: 12

## 2023-01-20 ENCOUNTER — Ambulatory Visit (INDEPENDENT_AMBULATORY_CARE_PROVIDER_SITE_OTHER): Payer: Medicare Other

## 2023-01-20 VITALS — BP 126/82 | HR 92 | Temp 98.3°F | Resp 16 | Ht 63.0 in | Wt 190.0 lb

## 2023-01-20 DIAGNOSIS — E860 Dehydration: Secondary | ICD-10-CM

## 2023-01-20 MED ORDER — SODIUM CHLORIDE 0.9 % IV BOLUS
1000.0000 mL | Freq: Once | INTRAVENOUS | Status: AC
  Administered 2023-01-20: 1000 mL via INTRAVENOUS
  Filled 2023-01-20: qty 1000

## 2023-01-20 NOTE — Progress Notes (Signed)
Diagnosis: Dehydration  Provider:  Chilton Greathouse MD  Procedure: IV Infusion  IV Type: Peripheral, IV Location: R Forearm  Normal Saline, Dose: 1000 ml  Infusion Start Time: 0839  Infusion Stop Time: 0943  Post Infusion IV Care: Peripheral IV Discontinued  Discharge: Condition: Good, Destination: Home . AVS Declined  Performed by:  Adriana Mccallum, RN

## 2023-01-21 ENCOUNTER — Inpatient Hospital Stay: Payer: Medicare Other | Admitting: Family Medicine

## 2023-01-24 ENCOUNTER — Ambulatory Visit (INDEPENDENT_AMBULATORY_CARE_PROVIDER_SITE_OTHER): Payer: Medicare Other

## 2023-01-24 VITALS — BP 121/67 | HR 79 | Temp 97.9°F | Resp 20 | Ht 63.0 in | Wt 190.6 lb

## 2023-01-24 DIAGNOSIS — E860 Dehydration: Secondary | ICD-10-CM

## 2023-01-24 MED ORDER — SODIUM CHLORIDE 0.9 % IV BOLUS
1000.0000 mL | Freq: Once | INTRAVENOUS | Status: AC
  Administered 2023-01-24: 1000 mL via INTRAVENOUS
  Filled 2023-01-24: qty 1000

## 2023-01-24 NOTE — Progress Notes (Signed)
Diagnosis: Dehydration  Provider:  Chilton Greathouse MD  Procedure: IV Infusion  IV Type: Peripheral, IV Location: R Forearm  Normal Saline, Dose: 1000 ml  Infusion Start Time: 0818  Infusion Stop Time: 0925  Post Infusion IV Care: Peripheral IV Discontinued  Discharge: Condition: Good, Destination: Home . AVS Declined  Performed by:  Loney Hering, LPN

## 2023-01-25 ENCOUNTER — Ambulatory Visit: Payer: Self-pay

## 2023-01-25 NOTE — Patient Instructions (Signed)
Visit Information  Thank you for taking time to visit with me today. Please don't hesitate to contact me if I can be of assistance to you.   Following are the goals we discussed today:   Goals Addressed             This Visit's Progress    Care Coordination Activities       Interventions Today    Flowsheet Row Most Recent Value  Chronic Disease   Chronic disease during today's visit Chronic Kidney Disease/End Stage Renal Disease (ESRD), Other  General Interventions   General Interventions Discussed/Reviewed General Interventions Discussed, Doctor Visits  Doctor Visits Discussed/Reviewed Doctor Visits Discussed, PCP, Specialist  [advised to call to reschedule visit with PCP. patient reports upcoming appointment with oncologist]  PCP/Specialist Visits Compliance with follow-up visit            Our next appointment is by telephone on 02/03/23 at 11 am  Please call the care guide team at [phone] if you need to cancel or reschedule your appointment.   If you are experiencing a Mental Health or Behavioral Health Crisis or need someone to talk to, please call the Suicide and Crisis Lifeline: 28  Kathyrn Sheriff, RN, MSN, BSN, CCM [facility] Care Coordinator [phone]

## 2023-01-25 NOTE — Patient Outreach (Signed)
  Care Coordination   Initial Visit Note   01/25/2023 Name: [name] MRN: [id number] DOB: [birth year]  [name] is a 67 y.o. year old female who sees Zola Button, Grayling Congress, DO for primary care. I spoke with  [name] by phone today.  What matters to the patients health and wellness today?  Admitted 01/10/23-01/13/23 with AKI. [name] reports this is not a good time to talk. She denies any specific questions or concerns at this time. She states she has an appointment with oncology coming up. Patient did not attend follow up visit with PCP. [name] states she will call to reschedule her appointment.   Goals Addressed             This Visit's Progress    Care Coordination Activities       Interventions Today    Flowsheet Row Most Recent Value  Chronic Disease   Chronic disease during today's visit Chronic Kidney Disease/End Stage Renal Disease (ESRD), Other  General Interventions   General Interventions Discussed/Reviewed General Interventions Discussed, Doctor Visits  Doctor Visits Discussed/Reviewed Doctor Visits Discussed, PCP, Specialist  [advised to call to reschedule visit with PCP. patient reports upcoming appointment with oncologist]  PCP/Specialist Visits Compliance with follow-up visit            SDOH assessments and interventions completed:  No  Care Coordination Interventions:  Yes, provided   Follow up plan: Follow up call scheduled for 02/03/23    Encounter Outcome:  Pt. Visit Completed   Kathyrn Sheriff, RN, MSN, BSN, CCM Care Coordinator [phone]

## 2023-01-27 ENCOUNTER — Encounter: Payer: Self-pay | Admitting: Medical Oncology

## 2023-01-27 ENCOUNTER — Inpatient Hospital Stay (HOSPITAL_BASED_OUTPATIENT_CLINIC_OR_DEPARTMENT_OTHER): Payer: Medicare Other | Admitting: Medical Oncology

## 2023-01-27 ENCOUNTER — Inpatient Hospital Stay: Payer: Medicare Other | Attending: Family

## 2023-01-27 ENCOUNTER — Other Ambulatory Visit: Payer: Medicare Other

## 2023-01-27 ENCOUNTER — Inpatient Hospital Stay: Payer: Medicare Other | Admitting: Medical Oncology

## 2023-01-27 ENCOUNTER — Ambulatory Visit (HOSPITAL_BASED_OUTPATIENT_CLINIC_OR_DEPARTMENT_OTHER)
Admission: RE | Admit: 2023-01-27 | Discharge: 2023-01-27 | Disposition: A | Discharge status: Home / Self Care | Payer: Medicare Other | Source: Ambulatory Visit | Attending: Medical Oncology | Admitting: Medical Oncology

## 2023-01-27 ENCOUNTER — Ambulatory Visit (INDEPENDENT_AMBULATORY_CARE_PROVIDER_SITE_OTHER): Payer: Medicare Other

## 2023-01-27 VITALS — BP 123/81 | HR 83 | Temp 97.9°F | Resp 20 | Ht 63.0 in | Wt 189.4 lb

## 2023-01-27 VITALS — BP 95/67 | HR 99 | Temp 98.2°F | Resp 22 | Wt 192.0 lb

## 2023-01-27 DIAGNOSIS — K51918 Ulcerative colitis, unspecified with other complication: Secondary | ICD-10-CM | POA: Diagnosis not present

## 2023-01-27 DIAGNOSIS — D649 Anemia, unspecified: Secondary | ICD-10-CM | POA: Insufficient documentation

## 2023-01-27 DIAGNOSIS — D6862 Lupus anticoagulant syndrome: Secondary | ICD-10-CM | POA: Insufficient documentation

## 2023-01-27 DIAGNOSIS — D500 Iron deficiency anemia secondary to blood loss (chronic): Secondary | ICD-10-CM

## 2023-01-27 DIAGNOSIS — I82622 Acute embolism and thrombosis of deep veins of left upper extremity: Secondary | ICD-10-CM | POA: Diagnosis not present

## 2023-01-27 DIAGNOSIS — E860 Dehydration: Secondary | ICD-10-CM

## 2023-01-27 DIAGNOSIS — Z7901 Long term (current) use of anticoagulants: Secondary | ICD-10-CM | POA: Diagnosis not present

## 2023-01-27 LAB — CBC WITH DIFFERENTIAL (CANCER CENTER ONLY)
Abs Immature Granulocytes: 0.07 10*3/uL (ref 0.00–0.07)
Basophils Absolute: 0.1 10*3/uL (ref 0.0–0.1)
Basophils Relative: 1 %
Eosinophils Absolute: 0.6 10*3/uL — ABNORMAL HIGH (ref 0.0–0.5)
Eosinophils Relative: 5 %
HCT: 30.7 % — ABNORMAL LOW (ref 36.0–46.0)
Hemoglobin: 9.7 g/dL — ABNORMAL LOW (ref 12.0–15.0)
Immature Granulocytes: 1 %
Lymphocytes Relative: 26 %
Lymphs Abs: 3.1 10*3/uL (ref 0.7–4.0)
MCH: 33.3 pg (ref 26.0–34.0)
MCHC: 31.6 g/dL (ref 30.0–36.0)
MCV: 105.5 fL — ABNORMAL HIGH (ref 80.0–100.0)
Monocytes Absolute: 0.7 10*3/uL (ref 0.1–1.0)
Monocytes Relative: 6 %
Neutro Abs: 7.2 10*3/uL (ref 1.7–7.7)
Neutrophils Relative %: 61 %
Platelet Count: 372 10*3/uL (ref 150–400)
RBC: 2.91 MIL/uL — ABNORMAL LOW (ref 3.87–5.11)
RDW: 16.2 % — ABNORMAL HIGH (ref 11.5–15.5)
WBC Count: 11.8 10*3/uL — ABNORMAL HIGH (ref 4.0–10.5)
nRBC: 0 % (ref 0.0–0.2)

## 2023-01-27 LAB — CMP (CANCER CENTER ONLY)
ALT: 22 U/L (ref 0–44)
AST: 22 U/L (ref 15–41)
Albumin: 4.3 g/dL (ref 3.5–5.0)
Alkaline Phosphatase: 105 U/L (ref 38–126)
Anion gap: 13 (ref 5–15)
BUN: 46 mg/dL — ABNORMAL HIGH (ref 8–23)
CO2: 15 mmol/L — ABNORMAL LOW (ref 22–32)
Calcium: 7.4 mg/dL — ABNORMAL LOW (ref 8.9–10.3)
Chloride: 112 mmol/L — ABNORMAL HIGH (ref 98–111)
Creatinine: 3.88 mg/dL — ABNORMAL HIGH (ref 0.44–1.00)
GFR, Estimated: 12 mL/min — ABNORMAL LOW (ref >60)
Glucose, Bld: 165 mg/dL — ABNORMAL HIGH (ref 70–99)
Potassium: 4.4 mmol/L (ref 3.5–5.1)
Sodium: 140 mmol/L (ref 135–145)
Total Bilirubin: 0.4 mg/dL (ref 0.3–1.2)
Total Protein: 7.2 g/dL (ref 6.5–8.1)

## 2023-01-27 LAB — RETICULOCYTES
Immature Retic Fract: 21.4 % — ABNORMAL HIGH (ref 2.3–15.9)
RBC.: 2.86 MIL/uL — ABNORMAL LOW (ref 3.87–5.11)
Retic Count, Absolute: 97.8 10*3/uL (ref 19.0–186.0)
Retic Ct Pct: 3.4 % — ABNORMAL HIGH (ref 0.4–3.1)

## 2023-01-27 LAB — FERRITIN: Ferritin: 115 ng/mL (ref 11–307)

## 2023-01-27 MED ORDER — RIVAROXABAN 10 MG PO TABS: 10.0000 mg | ORAL_TABLET | Freq: Every day | ORAL | 1 refills | Status: DC

## 2023-01-27 MED ORDER — SODIUM CHLORIDE 0.9 % IV BOLUS
1000.0000 mL | Freq: Once | INTRAVENOUS | Status: AC
  Administered 2023-01-27: 1000 mL via INTRAVENOUS

## 2023-01-27 MED ORDER — DOXYCYCLINE HYCLATE 100 MG PO TABS: 100.0000 mg | ORAL_TABLET | Freq: Two times a day (BID) | ORAL | 0 refills | Status: DC

## 2023-01-27 NOTE — Progress Notes (Signed)
Diagnosis: Dehydration  Provider:  Chilton Greathouse MD  Procedure: IV Infusion  IV Type: Peripheral, IV Location: R Forearm  Normal Saline, Dose: 1000 ml  Infusion Start Time: 0848  Infusion Stop Time: 0955  Post Infusion IV Care: Peripheral IV Discontinued  Discharge: Condition: Good, Destination: Home . AVS Declined  Performed by:  Marlow Baars [place], RN

## 2023-01-27 NOTE — Addendum Note (Signed)
Addended by: Clent Jacks on: 01/27/2023 04:38 PM   Modules accepted: Orders

## 2023-01-27 NOTE — Progress Notes (Addendum)
Hematology and Oncology Follow Up Visit  Gloriann Yonke [id number] April 26, 1956 67 y.o. 01/27/2023   Principle Diagnosis:  Recurrent iron deficiency anemia secondary to ulcerative colitis Right cephalic/basilic vein thrombus - transiently positive lupus anticoagulant   Current Therapy:        IV iron as indicated                Interim History:  Ms. Albu is here today a bit sooner than scheduled. She is concerned about redness of her left arm.   Patient is currently getting 2-3 sessions of IVF weekly from her nephrologist for her CKD and dehydration.  A few days ago she noticed some pain, tenderness, redness and swelling of her left arm near a recent IV site.  This has been stable for the past few days.  She is concerned she has a history of a blood clot in the other arm from an IV previously.  Formally she was on Coumadin which she tolerated fairly well.  Course is complicated though by her anemia which has been stable.   She denies any calf pain, new SOB, chest pain.   No fever, chills, n/v, cough, rash, abdominal pain or changes in bowel or bladder habits.   Appetite and hydration have been good. Weight is 184 lbs.    ECOG Performance Status: 1 - Symptomatic but completely ambulatory  Medications:  Allergies as of 01/27/2023       Reactions   Sulfa Antibiotics Hives, Itching, Rash, Swelling   Morphine Other (See Comments)   "Gives me crazy dreams" (delusions, also) Psychosis, also   Sulfonamide Derivatives Hives, Itching, Swelling        Medication List        Accurate as of Jan 27, 2023  2:17 PM. If you have any questions, ask your nurse or doctor.          acetaminophen 500 MG tablet Commonly known as: TYLENOL Take 1,000 mg by mouth every 6 (six) hours as needed (for pain/sleep).   allopurinol 300 MG tablet Commonly known as: ZYLOPRIM Take 0.5 tablets (150 mg total) by mouth at bedtime. What changed: how much to take   amLODipine 5 MG tablet Commonly known  as: NORVASC Take 5 mg by mouth See admin instructions. Take 5 mg by mouth once a day and hold for a low B/P reading   cholestyramine light 4 g packet Commonly known as: PREVALITE Take 1 packet (4 g total) by mouth 3 (three) times daily after meals.   DULoxetine 60 MG capsule Commonly known as: CYMBALTA TAKE TWO CAPSULES (120 mg total) BY MOUTH DAILY AT 9AM What changed: See the new instructions.   folic acid 1 MG tablet Commonly known as: FOLVITE Take 2 tablets (2 mg total) by mouth daily. What changed: how much to take   Gas-X Extra Strength 125 MG Caps Generic drug: Simethicone Take 125 mg by mouth at bedtime.   loperamide 2 MG tablet Commonly known as: IMODIUM A-D Take 4 mg by mouth in the morning and at bedtime.   metoprolol succinate 100 MG 24 hr tablet Commonly known as: TOPROL-XL Take 100 mg by mouth at bedtime.   OVER THE COUNTER MEDICATION Take 1 tablet by mouth See admin instructions. Nervive Nerve Health tablet- Take 1 tablet by mouth at bedtime   TUMS EXTRA STRENGTH 750 PO Take 1-2 tablets by mouth every 6 (six) hours as needed (CHEW for heartburn).   Vitamin D (Ergocalciferol) 1.25 MG (50000 UNIT) Caps  capsule Commonly known as: DRISDOL Take 50,000 Units by mouth every 30 (thirty) days.        Allergies:  Allergies  Allergen Reactions   Sulfa Antibiotics Hives, Itching, Rash and Swelling   Morphine Other (See Comments)    "Gives me crazy dreams" (delusions, also) Psychosis, also    Sulfonamide Derivatives Hives, Itching and Swelling    Past Medical History, Surgical history, Social history, and Family History were reviewed and updated.  Review of Systems: All other 10 point review of systems is negative.   Physical Exam:  weight is 192 lb (87.1 kg). Her oral temperature is 98.2 F (36.8 C). Her blood pressure is 95/67 and her pulse is 99. Her respiration is 22 (abnormal) and oxygen saturation is 100%.   Wt Readings from Last 3 Encounters:   01/27/23 192 lb (87.1 kg)  01/27/23 189 lb 6 oz (85.9 kg)  01/24/23 190 lb 9.6 oz (86.5 kg)    Ocular: Sclerae unicteric, pupils equal, round and reactive to light Lymphatic: No cervical or supraclavicular adenopathy Lungs no rales or rhonchi, good excursion bilaterally Heart regular rate and rhythm, no murmur appreciated. Radial pulses intact. Normal capillary refill of the fingers.  MSK: The left lower extremity has mild erythema and edema. There is mild tenderness to palpation of this area as well as a palpable rope like firmness. No sign of discharge or necrosis.   Lab Results  Component Value Date   WBC 11.8 (H) 01/27/2023   HGB 9.7 (L) 01/27/2023   HCT 30.7 (L) 01/27/2023   MCV 105.5 (H) 01/27/2023   PLT 372 01/27/2023   Lab Results  Component Value Date   FERRITIN 93 10/12/2022   IRON 66 10/12/2022   TIBC 381 10/12/2022   UIBC 315 10/12/2022   IRONPCTSAT 17 10/12/2022   Lab Results  Component Value Date   RETICCTPCT 3.4 (H) 01/27/2023   RBC 2.91 (L) 01/27/2023   RBC 2.86 (L) 01/27/2023   No results found for: "KPAFRELGTCHN", "LAMBDASER", "KAPLAMBRATIO" No results found for: "IGGSERUM", "IGA", "IGMSERUM" No results found for: "TOTALPROTELP", "ALBUMINELP", "A1GS", "A2GS", "BETS", "BETA2SER", "GAMS", "MSPIKE", "SPEI"   Chemistry      Component Value Date/Time   NA 137 01/13/2023 0545   NA 142 09/08/2017 0923   K 3.6 01/13/2023 0545   K 3.7 09/08/2017 0923   CL 109 01/13/2023 0545   CL 97 (L) 09/08/2017 0923   CO2 20 (L) 01/13/2023 0545   CO2 28 09/08/2017 0923   BUN 47 (H) 01/13/2023 0545   BUN 49 (H) 09/08/2017 0923   CREATININE 3.57 (H) 01/13/2023 0545   CREATININE 3.18 (HH) 10/12/2022 1310   CREATININE 3.1 (HH) 09/08/2017 0923      Component Value Date/Time   CALCIUM 7.4 (L) 01/13/2023 0545   CALCIUM 9.9 09/08/2017 0923   CALCIUM 8.9 04/12/2013 0851   ALKPHOS 91 01/11/2023 0533   ALKPHOS 89 (H) 09/08/2017 0923   AST 20 01/11/2023 0533   AST 17  10/12/2022 1310   ALT 19 01/11/2023 0533   ALT 17 10/12/2022 1310   ALT 22 09/08/2017 0923   BILITOT 0.6 01/11/2023 0533   BILITOT 0.4 10/12/2022 1310       Impression and Plan: Ms. Parfitt is a very pleasant 67 yo caucasian female with iron deficiency anemia secondary to intermittent GI blood loss/malabsorption with UC. 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  Iron studies are pending. We will replace if needed. Biggest concern today is her suspected  thromboembolism  Secondary to recent IV but does have a history of a thrombus and a transiently positive lupus anticoagulant  Starting her on doxycycline and likely will need to start her on xarelto (6 week course if superficial and at least 6 month course if DVT is seen) Stat Doppler exam today RTC 1 week APP, labs ( CBC w/, D-dimer, CMP, lupus anticoagulant )  Case reviewed with Dr. Rexene Edison who is in agreement with our plan of action   UPDATE: superficial thrombus visualized. I alerted patient. She will use heating pad on the area, doxy and xarelto 10 mg once daily x 6 weeks. Reviewed bleeding precautions.   Rushie Chestnut, PA-C 5/23/20242:17 PM

## 2023-01-28 ENCOUNTER — Telehealth: Payer: Self-pay

## 2023-01-28 LAB — IRON AND IRON BINDING CAPACITY (CC-WL,HP ONLY)
Iron: 66 ug/dL (ref 28–170)
Saturation Ratios: 16 % (ref 10.4–31.8)
TIBC: 414 ug/dL (ref 250–450)
UIBC: 348 ug/dL (ref 148–442)

## 2023-01-28 NOTE — Telephone Encounter (Signed)
Per Dr. Myna Hidalgo request he wants to increase patient's xarelto dose to 20 mg daily from 10 mg daily.  This RN called patient and informed patient to take two tablets of her xarelto daily. Pt verbalized understanding and teach back done on her change in medication dose. Pt aware that scheduling will call and set up an appointment for next week with labs and to see an APP. Pt had no further questions.

## 2023-02-01 ENCOUNTER — Observation Stay (HOSPITAL_COMMUNITY)
Admission: EM | Admit: 2023-02-01 | Discharge: 2023-02-03 | Disposition: A | Discharge status: Home / Self Care | Payer: Medicare Other | Attending: Internal Medicine | Admitting: Internal Medicine

## 2023-02-01 ENCOUNTER — Other Ambulatory Visit: Payer: Self-pay

## 2023-02-01 ENCOUNTER — Emergency Department (HOSPITAL_COMMUNITY): Payer: Medicare Other

## 2023-02-01 ENCOUNTER — Encounter (HOSPITAL_COMMUNITY): Payer: Self-pay

## 2023-02-01 ENCOUNTER — Encounter: Payer: Self-pay | Admitting: Gastroenterology

## 2023-02-01 ENCOUNTER — Ambulatory Visit (INDEPENDENT_AMBULATORY_CARE_PROVIDER_SITE_OTHER): Payer: Medicare Other

## 2023-02-01 VITALS — BP 150/87 | HR 85 | Temp 98.2°F | Resp 16 | Ht 63.0 in | Wt 187.0 lb

## 2023-02-01 DIAGNOSIS — G2581 Restless legs syndrome: Secondary | ICD-10-CM

## 2023-02-01 DIAGNOSIS — R55 Syncope and collapse: Secondary | ICD-10-CM | POA: Diagnosis not present

## 2023-02-01 DIAGNOSIS — I951 Orthostatic hypotension: Secondary | ICD-10-CM | POA: Insufficient documentation

## 2023-02-01 DIAGNOSIS — R42 Dizziness and giddiness: Secondary | ICD-10-CM | POA: Diagnosis not present

## 2023-02-01 DIAGNOSIS — K219 Gastro-esophageal reflux disease without esophagitis: Secondary | ICD-10-CM

## 2023-02-01 DIAGNOSIS — Z87891 Personal history of nicotine dependence: Secondary | ICD-10-CM | POA: Diagnosis not present

## 2023-02-01 DIAGNOSIS — I10 Essential (primary) hypertension: Secondary | ICD-10-CM

## 2023-02-01 DIAGNOSIS — F322 Major depressive disorder, single episode, severe without psychotic features: Secondary | ICD-10-CM

## 2023-02-01 DIAGNOSIS — K50819 Crohn's disease of both small and large intestine with unspecified complications: Secondary | ICD-10-CM | POA: Diagnosis not present

## 2023-02-01 DIAGNOSIS — E860 Dehydration: Secondary | ICD-10-CM

## 2023-02-01 DIAGNOSIS — R0902 Hypoxemia: Secondary | ICD-10-CM | POA: Diagnosis not present

## 2023-02-01 DIAGNOSIS — W19XXXA Unspecified fall, initial encounter: Secondary | ICD-10-CM | POA: Diagnosis not present

## 2023-02-01 DIAGNOSIS — F411 Generalized anxiety disorder: Secondary | ICD-10-CM

## 2023-02-01 DIAGNOSIS — N184 Chronic kidney disease, stage 4 (severe): Secondary | ICD-10-CM | POA: Diagnosis not present

## 2023-02-01 DIAGNOSIS — E872 Acidosis, unspecified: Secondary | ICD-10-CM | POA: Diagnosis not present

## 2023-02-01 DIAGNOSIS — Z7901 Long term (current) use of anticoagulants: Secondary | ICD-10-CM | POA: Diagnosis not present

## 2023-02-01 DIAGNOSIS — M109 Gout, unspecified: Secondary | ICD-10-CM | POA: Diagnosis not present

## 2023-02-01 DIAGNOSIS — R519 Headache, unspecified: Secondary | ICD-10-CM | POA: Diagnosis not present

## 2023-02-01 DIAGNOSIS — Z932 Ileostomy status: Secondary | ICD-10-CM | POA: Diagnosis not present

## 2023-02-01 DIAGNOSIS — J45909 Unspecified asthma, uncomplicated: Secondary | ICD-10-CM | POA: Diagnosis not present

## 2023-02-01 DIAGNOSIS — E785 Hyperlipidemia, unspecified: Secondary | ICD-10-CM

## 2023-02-01 DIAGNOSIS — I120 Hypertensive chronic kidney disease with stage 5 chronic kidney disease or end stage renal disease: Secondary | ICD-10-CM | POA: Diagnosis not present

## 2023-02-01 DIAGNOSIS — N185 Chronic kidney disease, stage 5: Secondary | ICD-10-CM | POA: Insufficient documentation

## 2023-02-01 DIAGNOSIS — I82611 Acute embolism and thrombosis of superficial veins of right upper extremity: Secondary | ICD-10-CM | POA: Diagnosis not present

## 2023-02-01 DIAGNOSIS — K5080 Crohn's disease of both small and large intestine without complications: Secondary | ICD-10-CM | POA: Diagnosis present

## 2023-02-01 DIAGNOSIS — Z79899 Other long term (current) drug therapy: Secondary | ICD-10-CM | POA: Diagnosis not present

## 2023-02-01 DIAGNOSIS — T1490XA Injury, unspecified, initial encounter: Secondary | ICD-10-CM

## 2023-02-01 DIAGNOSIS — S0003XA Contusion of scalp, initial encounter: Secondary | ICD-10-CM | POA: Diagnosis not present

## 2023-02-01 DIAGNOSIS — K5190 Ulcerative colitis, unspecified, without complications: Secondary | ICD-10-CM | POA: Diagnosis present

## 2023-02-01 DIAGNOSIS — R0789 Other chest pain: Secondary | ICD-10-CM | POA: Diagnosis not present

## 2023-02-01 DIAGNOSIS — D649 Anemia, unspecified: Secondary | ICD-10-CM | POA: Diagnosis not present

## 2023-02-01 DIAGNOSIS — D509 Iron deficiency anemia, unspecified: Secondary | ICD-10-CM | POA: Diagnosis present

## 2023-02-01 LAB — MAGNESIUM: Magnesium: 0.7 mg/dL — CL (ref 1.7–2.4)

## 2023-02-01 LAB — CBC WITH DIFFERENTIAL/PLATELET
Abs Immature Granulocytes: 0.15 10*3/uL — ABNORMAL HIGH (ref 0.00–0.07)
Basophils Absolute: 0.1 10*3/uL (ref 0.0–0.1)
Basophils Relative: 1 %
Eosinophils Absolute: 0.4 10*3/uL (ref 0.0–0.5)
Eosinophils Relative: 3 %
HCT: 32 % — ABNORMAL LOW (ref 36.0–46.0)
Hemoglobin: 10.4 g/dL — ABNORMAL LOW (ref 12.0–15.0)
Immature Granulocytes: 1 %
Lymphocytes Relative: 17 %
Lymphs Abs: 2.1 10*3/uL (ref 0.7–4.0)
MCH: 33.7 pg (ref 26.0–34.0)
MCHC: 32.5 g/dL (ref 30.0–36.0)
MCV: 103.6 fL — ABNORMAL HIGH (ref 80.0–100.0)
Monocytes Absolute: 0.8 10*3/uL (ref 0.1–1.0)
Monocytes Relative: 7 %
Neutro Abs: 9 10*3/uL — ABNORMAL HIGH (ref 1.7–7.7)
Neutrophils Relative %: 71 %
Platelets: 410 10*3/uL — ABNORMAL HIGH (ref 150–400)
RBC: 3.09 MIL/uL — ABNORMAL LOW (ref 3.87–5.11)
RDW: 16.8 % — ABNORMAL HIGH (ref 11.5–15.5)
WBC: 12.5 10*3/uL — ABNORMAL HIGH (ref 4.0–10.5)
nRBC: 0.2 % (ref 0.0–0.2)

## 2023-02-01 LAB — PHOSPHORUS: Phosphorus: 4.5 mg/dL (ref 2.5–4.6)

## 2023-02-01 LAB — COMPREHENSIVE METABOLIC PANEL
ALT: 22 U/L (ref 0–44)
ALT: 22 U/L (ref 0–44)
ALT: 24 U/L (ref 0–44)
AST: 28 U/L (ref 15–41)
AST: 26 U/L (ref 15–41)
AST: 30 U/L (ref 15–41)
Albumin: 2.9 g/dL — ABNORMAL LOW (ref 3.5–5.0)
Albumin: 3.1 g/dL — ABNORMAL LOW (ref 3.5–5.0)
Albumin: 3.2 g/dL — ABNORMAL LOW (ref 3.5–5.0)
Alkaline Phosphatase: 103 U/L (ref 38–126)
Alkaline Phosphatase: 105 U/L (ref 38–126)
Alkaline Phosphatase: 115 U/L (ref 38–126)
Anion gap: 13 (ref 5–15)
Anion gap: 12 (ref 5–15)
Anion gap: 13 (ref 5–15)
BUN: 47 mg/dL — ABNORMAL HIGH (ref 8–23)
BUN: 47 mg/dL — ABNORMAL HIGH (ref 8–23)
BUN: 47 mg/dL — ABNORMAL HIGH (ref 8–23)
CO2: 10 mmol/L — ABNORMAL LOW (ref 22–32)
CO2: 12 mmol/L — ABNORMAL LOW (ref 22–32)
CO2: 14 mmol/L — ABNORMAL LOW (ref 22–32)
Calcium: 5.9 mg/dL — CL (ref 8.9–10.3)
Calcium: 6.8 mg/dL — ABNORMAL LOW (ref 8.9–10.3)
Calcium: 6.8 mg/dL — ABNORMAL LOW (ref 8.9–10.3)
Chloride: 113 mmol/L — ABNORMAL HIGH (ref 98–111)
Chloride: 111 mmol/L (ref 98–111)
Chloride: 111 mmol/L (ref 98–111)
Creatinine, Ser: 3.47 mg/dL — ABNORMAL HIGH (ref 0.44–1.00)
Creatinine, Ser: 3.42 mg/dL — ABNORMAL HIGH (ref 0.44–1.00)
Creatinine, Ser: 3.51 mg/dL — ABNORMAL HIGH (ref 0.44–1.00)
GFR, Estimated: 14 mL/min — ABNORMAL LOW (ref >60)
GFR, Estimated: 14 mL/min — ABNORMAL LOW (ref >60)
GFR, Estimated: 14 mL/min — ABNORMAL LOW (ref >60)
Glucose, Bld: 124 mg/dL — ABNORMAL HIGH (ref 70–99)
Glucose, Bld: 111 mg/dL — ABNORMAL HIGH (ref 70–99)
Glucose, Bld: 134 mg/dL — ABNORMAL HIGH (ref 70–99)
Potassium: 3.5 mmol/L (ref 3.5–5.1)
Potassium: 3.6 mmol/L (ref 3.5–5.1)
Potassium: 3 mmol/L — ABNORMAL LOW (ref 3.5–5.1)
Sodium: 136 mmol/L (ref 135–145)
Sodium: 135 mmol/L (ref 135–145)
Sodium: 138 mmol/L (ref 135–145)
Total Bilirubin: 0.4 mg/dL (ref 0.3–1.2)
Total Bilirubin: 0.4 mg/dL (ref 0.3–1.2)
Total Bilirubin: 0.6 mg/dL (ref 0.3–1.2)
Total Protein: 6.3 g/dL — ABNORMAL LOW (ref 6.5–8.1)
Total Protein: 6.7 g/dL (ref 6.5–8.1)
Total Protein: 6.8 g/dL (ref 6.5–8.1)

## 2023-02-01 LAB — VITAMIN D 25 HYDROXY (VIT D DEFICIENCY, FRACTURES): Vit D, 25-Hydroxy: 28.4 ng/mL — ABNORMAL LOW (ref 30–100)

## 2023-02-01 MED ORDER — LOPERAMIDE HCL 2 MG PO CAPS
4.0000 mg | ORAL_CAPSULE | Freq: Two times a day (BID) | ORAL | Status: DC
  Administered 2023-02-01 – 2023-02-02 (×2): 4 mg via ORAL
  Filled 2023-02-01 (×2): qty 2

## 2023-02-01 MED ORDER — RIVAROXABAN 10 MG PO TABS
10.0000 mg | ORAL_TABLET | Freq: Every day | ORAL | Status: DC
  Administered 2023-02-02: 10 mg via ORAL
  Filled 2023-02-01: qty 1

## 2023-02-01 MED ORDER — METOPROLOL SUCCINATE ER 25 MG PO TB24
100.0000 mg | ORAL_TABLET | Freq: Every day | ORAL | Status: DC
  Administered 2023-02-01 – 2023-02-02 (×2): 100 mg via ORAL
  Filled 2023-02-01 (×2): qty 4

## 2023-02-01 MED ORDER — FOLIC ACID 1 MG PO TABS
1.0000 mg | ORAL_TABLET | Freq: Every day | ORAL | Status: DC
  Administered 2023-02-02 – 2023-02-03 (×2): 1 mg via ORAL
  Filled 2023-02-01 (×2): qty 1

## 2023-02-01 MED ORDER — CALCIUM GLUCONATE-NACL 1-0.675 GM/50ML-% IV SOLN
1.0000 g | Freq: Once | INTRAVENOUS | Status: AC
  Administered 2023-02-01: 1000 mg via INTRAVENOUS
  Filled 2023-02-01: qty 50

## 2023-02-01 MED ORDER — MAGNESIUM SULFATE 2 GM/50ML IV SOLN
2.0000 g | Freq: Once | INTRAVENOUS | Status: AC
  Administered 2023-02-01: 2 g via INTRAVENOUS
  Filled 2023-02-01: qty 50

## 2023-02-01 MED ORDER — CALCIUM CARBONATE ANTACID 500 MG PO CHEW: 750.0000 mg | CHEWABLE_TABLET | Freq: Four times a day (QID) | ORAL | Status: DC | PRN

## 2023-02-01 MED ORDER — SODIUM CHLORIDE 0.9% FLUSH
3.0000 mL | Freq: Two times a day (BID) | INTRAVENOUS | Status: DC
  Administered 2023-02-01 – 2023-02-03 (×4): 3 mL via INTRAVENOUS

## 2023-02-01 MED ORDER — ALLOPURINOL 100 MG PO TABS
150.0000 mg | ORAL_TABLET | Freq: Every day | ORAL | Status: DC
  Administered 2023-02-01 – 2023-02-02 (×2): 150 mg via ORAL
  Filled 2023-02-01 (×2): qty 2

## 2023-02-01 MED ORDER — DOXYCYCLINE HYCLATE 100 MG PO TABS
100.0000 mg | ORAL_TABLET | Freq: Two times a day (BID) | ORAL | Status: DC
  Administered 2023-02-01 – 2023-02-02 (×2): 100 mg via ORAL
  Filled 2023-02-01 (×2): qty 1

## 2023-02-01 MED ORDER — ACETAMINOPHEN 650 MG RE SUPP: 650.0000 mg | Freq: Four times a day (QID) | RECTAL | Status: DC | PRN

## 2023-02-01 MED ORDER — CHOLESTYRAMINE LIGHT 4 G PO PACK: 4.0000 g | PACK | Freq: Three times a day (TID) | ORAL | Status: DC

## 2023-02-01 MED ORDER — DULOXETINE HCL 30 MG PO CPEP
120.0000 mg | ORAL_CAPSULE | Freq: Every day | ORAL | Status: DC
  Administered 2023-02-01 – 2023-02-02 (×2): 120 mg via ORAL
  Filled 2023-02-01 (×2): qty 4

## 2023-02-01 MED ORDER — SODIUM CHLORIDE 0.9 % IV BOLUS
1000.0000 mL | Freq: Once | INTRAVENOUS | Status: AC
  Administered 2023-02-01: 1000 mL via INTRAVENOUS

## 2023-02-01 MED ORDER — ACETAMINOPHEN 325 MG PO TABS: 650.0000 mg | ORAL_TABLET | Freq: Four times a day (QID) | ORAL | Status: DC | PRN

## 2023-02-01 NOTE — ED Notes (Signed)
Pt's ostomy bag has a small leak at the top, system is a one piece system. This Special educational needs teacher have called down to supply several times over the last two hours and also to two other units looking for supplies. Supply finally sent a two piece system. Pt wants to wait and see what her plan of care is before using a two piece system.

## 2023-02-01 NOTE — ED Notes (Signed)
ED TO INPATIENT HANDOFF REPORT  ED Nurse Name and Phone #: Clista Bernhardt Name/Age/Gender [name] 67 y.o. female Room/Bed: 002C/002C  Code Status   Code Status: Full Code  Home/SNF/Other Home Patient oriented to: self, place, time, and situation Is this baseline? Yes   Triage Complete: Triage complete  Chief Complaint Hypocalcemia [E83.51]  Triage Note No notes on file   Allergies Allergies  Allergen Reactions   Sulfa Antibiotics Hives, Itching, Rash and Swelling   Morphine Other (See Comments)    "Gives me crazy dreams" (delusions, also) Psychosis, also    Sulfonamide Derivatives Hives, Itching and Swelling    Level of Care/Admitting Diagnosis ED Disposition     ED Disposition  Admit   Condition  --   Comment  Hospital Area: [facility] [[id number]]  Level of Care: Telemetry Medical [104]  May place patient in observation at [facility] or [facility] if equivalent level of care is available:: No  Covid Evaluation: Asymptomatic - no recent exposure (last 10 days) testing not required  Diagnosis: Hypocalcemia [275.41.ICD-9-CM]  Admitting Physician: Synetta Fail [[id number]]  Attending Physician: Synetta Fail [[id number]]          B Medical/Surgery History Past Medical History:  Diagnosis Date   Abdominal pain    Hx   Acute pyelonephritis 12/07/2020   Allergy    Anal stenosis    Anemia    Anxiety    Arthritis    Asthma    patient does not have inhaler   Blood in stool    Hx   Blood in urine    Hx   Blood transfusion without reported diagnosis    Cataract    CKD (chronic kidney disease) stage 3, GFR 30-59 ml/min (HCC)    Crohn's colitis, other complication (HCC)    De Quervain's tenosynovitis    Depression    Difficulty urinating    Hx   Easy bruising    Esophagitis    Fainting    History - resolved - due to dehydration   Fatigue    Hx   Fibroid    Hx   Gastric polyp    GERD (gastroesophageal  reflux disease)    Hearing loss    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   Hemorrhoids, external    Hemorrhoids, internal    Herpes, genital    vaginal treated 07/05/12 and pt states is resolved   History of cervical dysplasia    History of small bowel obstruction    Hyperlipidemia    currently no meds   Hyperparathyroidism    Hypertension    Hypokalemia    Hx   Hypotension 06/29/2022   IBD (inflammatory bowel disease)    initially colectomy for suspected UC, 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   Obesity    Ovarian cyst    Pain in joint, pelvic region and thigh 12/16/2008   Centricity Description: HIP PAIN, LEFT  Qualifier: Diagnosis of   By: Janit Bern      Centricity Description: HIP PAIN  Qualifier: Diagnosis of   By: Janit Bern       Pain of right thumb 04/12/2018   Poor dental hygiene    Pulmonary nodule, right    right upper lobe   Rectal bleeding    Hx   Rectal pain    Hx   Renal insufficiency  CKD - stage 3   RLS (restless legs syndrome)    no meds   Sepsis secondary to UTI (HCC) 06/29/2022   Severe sepsis (HCC) 12/07/2020   Tooth infection 11/2016   right low   Ulcerative colitis    Visual disturbance    wears glasses   Weakness generalized    Hx - patient denies generalized weakness   Wears dentures    upper only   Past Surgical History:  Procedure Laterality Date   ANAL DILATION     BIOPSY  07/20/2022   Procedure: BIOPSY;  Surgeon: Meryl Dare, MD;  Location: [facility];  Service: Gastroenterology;;   CERVICAL BIOPSY  W/ LOOP ELECTRODE EXCISION     CHOLECYSTECTOMY     COLONOSCOPY     [name]   fatty tumor removed from back     X 2   HEMORRHOID SURGERY     ILEOSCOPY N/A 07/20/2022   Procedure: ILEOSCOPY THROUGH STOMA;  Surgeon: Meryl Dare, MD;  Location: [facility];  Service: Gastroenterology;  Laterality: N/A;   ILEOSTOMY CLOSURE     RESTORATIVE PROCTOCOLECTOMY      with insertion of ileoanal J Pouch with loop ileostomy   SHOULDER ARTHROSCOPY WITH CAPSULORRHAPHY Left 06/14/2019   Procedure: LEFT SHOULDER ARTHRSCOPIC REPAIR OF BONY BANKART FRACTURE;  Surgeon: Jones Broom, MD;  Location: [facility];  Service: Orthopedics;  Laterality: Left;  REQUEST 90 MINUTE   SIGMOIDOSCOPY     TOTAL ABDOMINAL HYSTERECTOMY  1998   TAH/LSO   TUBAL LIGATION     UPPER GASTROINTESTINAL ENDOSCOPY     [name]     A IV Location/Drains/Wounds Patient Lines/Drains/Airways Status     Active Line/Drains/Airways     Name Placement date Placement time Site Days   Peripheral IV 02/01/23 20 G Anterior;Left Forearm 02/01/23  1056  Forearm  less than 1   Ileostomy Standard (end) RLQ 06/30/22  1013  RLQ  216            Intake/Output Last 24 hours  Intake/Output Summary (Last 24 hours) at 02/01/2023 1512 Last data filed at 02/01/2023 1339 Gross per 24 hour  Intake 1000 ml  Output --  Net 1000 ml    Labs/Imaging Results for orders placed or performed during the hospital encounter of 02/01/23 (from the past 48 hour(s))  CBC with Differential     Status: Abnormal   Collection Time: 02/01/23 10:56 AM  Result Value Ref Range   WBC 12.5 (H) 4.0 - 10.5 K/uL   RBC 3.09 (L) 3.87 - 5.11 MIL/uL   Hemoglobin 10.4 (L) 12.0 - 15.0 g/dL   HCT 16.1 (L) 09.6 - 04.5 %   MCV 103.6 (H) 80.0 - 100.0 fL   MCH 33.7 26.0 - 34.0 pg   MCHC 32.5 30.0 - 36.0 g/dL   RDW 40.9 (H) 81.1 - 91.4 %   Platelets 410 (H) 150 - 400 K/uL   nRBC 0.2 0.0 - 0.2 %   Neutrophils Relative % 71 %   Neutro Abs 9.0 (H) 1.7 - 7.7 K/uL   Lymphocytes Relative 17 %   Lymphs Abs 2.1 0.7 - 4.0 K/uL   Monocytes Relative 7 %   Monocytes Absolute 0.8 0.1 - 1.0 K/uL   Eosinophils Relative 3 %   Eosinophils Absolute 0.4 0.0 - 0.5 K/uL   Basophils Relative 1 %   Basophils Absolute 0.1 0.0 - 0.1 K/uL   Immature Granulocytes 1 %   Abs Immature Granulocytes 0.15 (H) 0.00 - 0.07  K/uL    Comment: Performed at [facility], [address], [place], [place] [address]  Comprehensive metabolic panel     Status: Abnormal   Collection Time: 02/01/23 12:17 PM  Result Value Ref Range   Sodium 136 135 - 145 mmol/L   Potassium 3.5 3.5 - 5.1 mmol/L   Chloride 113 (H) 98 - 111 mmol/L   CO2 10 (L) 22 - 32 mmol/L   Glucose, Bld 124 (H) 70 - 99 mg/dL    Comment: Glucose reference range applies only to samples taken after fasting for at least 8 hours.   BUN 47 (H) 8 - 23 mg/dL   Creatinine, Ser 6.21 (H) 0.44 - 1.00 mg/dL   Calcium 5.9 (LL) 8.9 - 10.3 mg/dL    Comment: CRITICAL RESULT CALLED TO, READ BACK BY AND VERIFIED WITH Cherlyn Labella, RN @ 1300 02/01/23 BY SEKDAHL   Total Protein 6.3 (L) 6.5 - 8.1 g/dL   Albumin 2.9 (L) 3.5 - 5.0 g/dL   AST 28 15 - 41 U/L   ALT 22 0 - 44 U/L   Alkaline Phosphatase 103 38 - 126 U/L   Total Bilirubin 0.4 0.3 - 1.2 mg/dL   GFR, Estimated 14 (L) >60 mL/min    Comment: (NOTE) Calculated using the CKD-EPI Creatinine Equation (2021)    Anion gap 13 5 - 15    Comment: Performed at [facility], [address], [place], [place] [address]   *Note: Due to a large number of results and/or encounters for the requested time period, some results have not been displayed. A complete set of results can be found in Results Review.   DG Ribs Unilateral W/Chest Left  Result Date: 02/01/2023 CLINICAL DATA:  Fall.  Left chest wall pain EXAM: LEFT RIBS AND CHEST - 3 VIEW COMPARISON:  Chest x-ray 01/10/2023 and older FINDINGS: No consolidation, pneumothorax or effusion. No edema. Normal cardiopericardial silhouette. Overlapping cardiac leads. Mild eventration of the right hemidiaphragm. Degenerative changes of the shoulders. Degenerative changes along the visualized spine. No rib fracture seen. IMPRESSION: No rib fracture seen.  No pneumothorax. Electronically Signed   By: Karen Kays M.D.   On: 02/01/2023 11:44   CT HEAD WO CONTRAST ( )  Result Date: 02/01/2023 CLINICAL DATA:  Larey Seat  with trauma to the head.  Anticoagulated. EXAM: CT HEAD WITHOUT CONTRAST TECHNIQUE: Contiguous axial images were obtained from the base of the skull through the vertex without intravenous contrast. RADIATION DOSE REDUCTION: This exam was performed according to the departmental dose-optimization program which includes automated exposure control, adjustment of the mA and/or kV according to patient size and/or use of iterative reconstruction technique. COMPARISON:  None Available. FINDINGS: Brain: The brain has normal appearance without evidence of atrophy, old or acute infarction, mass lesion, hemorrhage, hydrocephalus or extra-axial collection. Vascular: There is atherosclerotic calcification of the major vessels at the base of the brain. Skull: Normal Sinuses/Orbits: Clear/normal Other: Left frontal scalp hematoma. IMPRESSION: 1. Left frontal scalp hematoma. No underlying skull fracture. No intracranial injury. 2. Atherosclerotic calcification of the major vessels at the base of the brain. Electronically Signed   By: Paulina Fusi M.D.   On: 02/01/2023 11:34    Pending Labs Unresulted Labs (From admission, onward)     Start     Ordered   02/02/23 0500  CBC  Tomorrow morning,   R        02/01/23 1507   02/01/23 1452  Parathyroid hormone, intact (no Ca)  Once,   R        02/01/23 1507   02/01/23 1451  Comprehensive metabolic panel  Now then every 6 hours,   R (with TIMED occurrences)      02/01/23 1507   02/01/23 1451  Magnesium  Once,   R        02/01/23 1507   02/01/23 1451  Phosphorus  Once,   R        02/01/23 1507   02/01/23 1451  VITAMIN D 25 Hydroxy (Vit-D Deficiency, Fractures)  Once,   R        02/01/23 1507            Vitals/Pain Today's Vitals   02/01/23 1300 02/01/23 1400 02/01/23 1417 02/01/23 1500  BP: 135/83 127/86  133/70  Pulse: 93 91  93  Resp: 17 17  20   Temp:   98.3 F (36.8 C)   TempSrc:   Oral   SpO2: 99% 100%  100%  Weight:      Height:      PainSc:  3        Isolation Precautions No active isolations  Medications Medications  calcium gluconate 1 g/ 50 mL sodium chloride IVPB (1,000 mg Intravenous New Bag/Given 02/01/23 1417)  allopurinol (ZYLOPRIM) tablet 150 mg (has no administration in time range)  doxycycline (VIBRA-TABS) tablet 100 mg (has no administration in time range)  metoprolol succinate (TOPROL-XL) 24 hr tablet 100 mg (has no administration in time range)  cholestyramine light (PREVALITE) packet 4 g (has no administration in time range)  DULoxetine (CYMBALTA) DR capsule 120 mg (has no administration in time range)  loperamide (IMODIUM A-D) tablet 4 mg (has no administration in time range)  calcium carbonate (TUMS EX) chewable tablet 750 mg (has no administration in time range)  folic acid (FOLVITE) tablet 1 mg (has no administration in time range)  rivaroxaban (XARELTO) tablet 10 mg (has no administration in time range)  sodium chloride flush (NS) 0.9 % injection 3 mL (has no administration in time range)  acetaminophen (TYLENOL) tablet 650 mg (has no administration in time range)    Or  acetaminophen (TYLENOL) suppository 650 mg (has no administration in time range)  sodium chloride 0.9 % bolus 1,000 mL (0 mLs Intravenous Stopped 02/01/23 1339)    Mobility walks     Focused Assessments Renal Assessment Handoff:  R Recommendations: See Admitting Provider Note  Report given to:   Additional Notes: pt needs new ostomy bag and is hoping the floor has a 2 piece system. The only replacement bag I could find was a 2 piece system that she didn't want to use if possible. I called multiple floors/supply.

## 2023-02-01 NOTE — Progress Notes (Signed)
Orthopedic Tech Progress Note Patient Details:  [name] 1956-02-07 [id number]  Level 2 trauma   Patient ID: [name], female   DOB: [birth year], 67 y.o.   MRN: [id number]  [name] 02/01/2023, 10:52 AM

## 2023-02-01 NOTE — H&P (Signed)
History and Physical   [name] JYN:[id number] DOB: 1955-09-30 DOA: 02/01/2023  PCP: [name], DO   Patient coming from: Infusion center  Chief Complaint: Near syncope/fall  HPI: [name] is a 67 y.o. female with medical history significant of RLS, obesity, hypertension, hyperlipidemia, GERD, gout, CKD 4, depression, anxiety, anemia, Crohn's disease, ulcerative colitis, high output ileostomy, short-bowel syndrome, IBD, low back pain, immune deficiency, VTE presenting after near syncope fall at infusion center.  Patient has history of Crohn's disease with high output ileostomy.  Currently receiving 1 L IV fluid infusion twice a week.  She went to have her infusion today and was still feeling dehydrated after receiving her liter.  She got up to leave and felt lightheaded.  She did fall to the ground but denies loss of consciousness.  She hit her head and reports some head pain as well as some left-sided chest pain.  She was recently started on anticoagulation due to superficial VTE possibly related to IVs for infusions.  She denies fevers, chills, chest pain, shortness of breath, abdominal pain, constipation, diarrhea, nausea, vomiting.  ED Course: Vital signs in the ED notable for blood pressure in the 120s to 150s systolic.  Lab workup included CMP with chloride of 113, bicarb 10, BUN 47, creatinine stable 3.47, glucose 124, calcium 5.9 but corrects to 6.8 considering albumin of 2.9, protein 6.3.  CBC with stable leukocytosis at 12.5, hemoglobin stable at 10.4, platelets 410.  Left rib and chest x-ray showed no acute abnormality.  CT head showed left scalp hematoma but no underlying fracture, also showed atherosclerosis of major vessels of the brain.  Patient received 1 g calcium gluconate and a liter of IV fluids in the ED.  Review of Systems: As per HPI otherwise all other systems reviewed and are negative.  Past Medical History:  Diagnosis Date   Abdominal pain     Hx   Acute pyelonephritis 12/07/2020   Allergy    Anal stenosis    Anemia    Anxiety    Arthritis    Asthma    patient does not have inhaler   Blood in stool    Hx   Blood in urine    Hx   Blood transfusion without reported diagnosis    Cataract    CKD (chronic kidney disease) stage 3, GFR 30-59 ml/min (HCC)    Crohn's colitis, other complication (HCC)    De Quervain's tenosynovitis    Depression    Difficulty urinating    Hx   Easy bruising    Esophagitis    Fainting    History - resolved - due to dehydration   Fatigue    Hx   Fibroid    Hx   Gastric polyp    GERD (gastroesophageal reflux disease)    Hearing loss    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   Hemorrhoids, external    Hemorrhoids, internal    Herpes, genital    vaginal treated 07/05/12 and pt states is resolved   History of cervical dysplasia    History of small bowel obstruction    Hyperlipidemia    currently no meds   Hyperparathyroidism    Hypertension    Hypokalemia    Hx   Hypotension 06/29/2022   IBD (inflammatory bowel disease)    initially colectomy for suspected UC, 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  Obesity    Ovarian cyst    Pain in joint, pelvic region and thigh 12/16/2008   Centricity Description: HIP PAIN, LEFT  Qualifier: Diagnosis of   By: Janit Bern      Centricity Description: HIP PAIN  Qualifier: Diagnosis of   By: Janit Bern       Pain of right thumb 04/12/2018   Poor dental hygiene    Pulmonary nodule, right    right upper lobe   Rectal bleeding    Hx   Rectal pain    Hx   Renal insufficiency    CKD - stage 3   RLS (restless legs syndrome)    no meds   Sepsis secondary to UTI (HCC) 06/29/2022   Severe sepsis (HCC) 12/07/2020   Tooth infection 11/2016   right low   Ulcerative colitis    Visual disturbance    wears glasses   Weakness generalized    Hx - patient denies generalized weakness   Wears  dentures    upper only    Past Surgical History:  Procedure Laterality Date   ANAL DILATION     BIOPSY  07/20/2022   Procedure: BIOPSY;  Surgeon: Meryl Dare, MD;  Location: [facility];  Service: Gastroenterology;;   CERVICAL BIOPSY  W/ LOOP ELECTRODE EXCISION     CHOLECYSTECTOMY     COLONOSCOPY     [name]   fatty tumor removed from back     X 2   HEMORRHOID SURGERY     ILEOSCOPY N/A 07/20/2022   Procedure: ILEOSCOPY THROUGH STOMA;  Surgeon: Meryl Dare, MD;  Location: [facility];  Service: Gastroenterology;  Laterality: N/A;   ILEOSTOMY CLOSURE     RESTORATIVE PROCTOCOLECTOMY     with insertion of ileoanal J Pouch with loop ileostomy   SHOULDER ARTHROSCOPY WITH CAPSULORRHAPHY Left 06/14/2019   Procedure: LEFT SHOULDER ARTHRSCOPIC REPAIR OF BONY BANKART FRACTURE;  Surgeon: Jones Broom, MD;  Location: [facility];  Service: Orthopedics;  Laterality: Left;  REQUEST 90 MINUTE   SIGMOIDOSCOPY     TOTAL ABDOMINAL HYSTERECTOMY  1998   TAH/LSO   TUBAL LIGATION     UPPER GASTROINTESTINAL ENDOSCOPY     [name]    Social History  reports that she quit smoking about 43 years ago. Her smoking use included cigarettes. She has a 4.00 pack-year smoking history. She has never used smokeless tobacco. She reports that she does not drink alcohol and does not use drugs.  Allergies  Allergen Reactions   Sulfa Antibiotics Hives, Itching, Rash and Swelling   Morphine Other (See Comments)    "Gives me crazy dreams" (delusions, also) Psychosis, also    Sulfonamide Derivatives Hives, Itching and Swelling    Family History  Problem Relation Age of Onset   Hypertension Mother    Heart disease Mother        s/p pci   Ulcerative colitis Father    Hypertension Father    Heart attack Father    Diabetes Sister    Cancer Sister        uterine   Pulmonary fibrosis Sister    Ulcerative colitis Daughter    Cancer Maternal Uncle        LUNG   Irritable bowel syndrome Other         grandchildren   Colon cancer Neg Hx    Esophageal cancer Neg Hx    Stomach cancer Neg Hx    Rectal cancer Neg Hx   Reviewed on  admission  Prior to Admission medications   Medication Sig Start Date End Date Taking? Authorizing Provider  acetaminophen (TYLENOL) 500 MG tablet Take 1,000 mg by mouth every 6 (six) hours as needed (for pain/sleep).    [provider]  allopurinol (ZYLOPRIM) 300 MG tablet Take 0.5 tablets (150 mg total) by mouth at bedtime. Patient taking differently: Take 300 mg by mouth at bedtime. 12/10/22   Seabron Spates R, DO  amLODipine (NORVASC) 5 MG tablet Take 5 mg by mouth See admin instructions. Take 5 mg by mouth once a day and hold for a low B/P reading 05/20/22   [provider]  Calcium Carbonate Antacid (TUMS EXTRA STRENGTH 750 PO) Take 1-2 tablets by mouth every 6 (six) hours as needed (CHEW for heartburn).    [provider]  cholestyramine light (PREVALITE) 4 g packet Take 1 packet (4 g total) by mouth 3 (three) times daily after meals. 07/21/22   Willeen Niece, MD  doxycycline (VIBRA-TABS) 100 MG tablet Take 1 tablet (100 mg total) by mouth 2 (two) times daily for 7 days. 01/27/23 02/03/23  Rushie Chestnut, PA-C  DULoxetine (CYMBALTA) 60 MG capsule TAKE TWO CAPSULES (120 mg total) BY MOUTH DAILY AT 9AM Patient taking differently: Take 120 mg by mouth at bedtime. 01/07/23   [name], DO  folic acid (FOLVITE) 1 MG tablet Take 2 tablets (2 mg total) by mouth daily. Patient taking differently: Take 1 mg by mouth daily. 05/26/17   Josph Macho, MD  GAS-X EXTRA STRENGTH 125 MG CAPS Take 125 mg by mouth at bedtime. Patient not taking: Reported on 01/14/2023    [provider]  loperamide (IMODIUM A-D) 2 MG tablet Take 4 mg by mouth in the morning and at bedtime.    [provider]  metoprolol succinate (TOPROL-XL) 100 MG 24 hr tablet Take 100 mg by mouth at bedtime.    [provider]  OVER THE  COUNTER MEDICATION Take 1 tablet by mouth See admin instructions. Nervive Nerve Health tablet- Take 1 tablet by mouth at bedtime    [provider]  rivaroxaban (XARELTO) 10 MG TABS tablet Take 1 tablet (10 mg total) by mouth daily. 01/27/23   Rushie Chestnut, PA-C  Vitamin D, Ergocalciferol, (DRISDOL) 1.25 MG (50000 UNIT) CAPS capsule Take 50,000 Units by mouth every 30 (thirty) days.    [provider]    Physical Exam: Vitals:   02/01/23 1200 02/01/23 1300 02/01/23 1400 02/01/23 1417  BP: 137/78 135/83 127/86   Pulse: 95 93 91   Resp: (!) 21 17 17    Temp:    98.3 F (36.8 C)  TempSrc:    Oral  SpO2: 100% 99% 100%   Weight:      Height:        Physical Exam Constitutional:      General: She is not in acute distress.    Appearance: Normal appearance. She is obese.  HENT:     Head:     Comments: Left frontal Hematoma    Mouth/Throat:     Mouth: Mucous membranes are moist.     Pharynx: Oropharynx is clear.  Eyes:     Extraocular Movements: Extraocular movements intact.     Pupils: Pupils are equal, round, and reactive to light.  Cardiovascular:     Rate and Rhythm: Normal rate and regular rhythm.     Pulses: Normal pulses.     Heart sounds: Normal heart sounds.  Pulmonary:     Effort: Pulmonary effort is normal. No respiratory distress.     Breath sounds: Normal breath sounds.  Abdominal:     General: Bowel sounds are normal. There is no distension.     Palpations: Abdomen is soft.     Tenderness: There is no abdominal tenderness.     Comments: Ostomy in place  Musculoskeletal:        General: No swelling or deformity.  Skin:    General: Skin is warm and dry.  Neurological:     General: No focal deficit present.     Mental Status: Mental status is at baseline.    Labs on Admission: I have personally reviewed following labs and imaging studies  CBC: Recent Labs  Lab 01/27/23 1345 02/01/23 1056  WBC 11.8* 12.5*  NEUTROABS 7.2 9.0*  HGB  9.7* 10.4*  HCT 30.7* 32.0*  MCV 105.5* 103.6*  PLT 372 410*    Basic Metabolic Panel: Recent Labs  Lab 01/27/23 1345 02/01/23 1217  NA 140 136  K 4.4 3.5  CL 112* 113*  CO2 15* 10*  GLUCOSE 165* 124*  BUN 46* 47*  CREATININE 3.88* 3.47*  CALCIUM 7.4* 5.9*    GFR: Estimated Creatinine Clearance: 15.8 mL/min (A) (by C-G formula based on SCr of 3.47 mg/dL (H)).  Liver Function Tests: Recent Labs  Lab 01/27/23 1345 02/01/23 1217  AST 22 28  ALT 22 22  ALKPHOS 105 103  BILITOT 0.4 0.4  PROT 7.2 6.3*  ALBUMIN 4.3 2.9*    Urine analysis:    Component Value Date/Time   COLORURINE YELLOW 01/10/2023 0110   APPEARANCEUR CLOUDY (A) 01/10/2023 0110   LABSPEC 1.015 01/10/2023 0110   PHURINE 6.0 01/10/2023 0110   GLUCOSEU NEGATIVE 01/10/2023 0110   GLUCOSEU NEGATIVE 03/14/2009 1603   HGBUR SMALL (A) 01/10/2023 0110   BILIRUBINUR NEGATIVE 01/10/2023 0110   BILIRUBINUR negative 03/05/2022 1121   KETONESUR NEGATIVE 01/10/2023 0110   PROTEINUR 100 (A) 01/10/2023 0110   UROBILINOGEN 0.2 03/05/2022 1121   UROBILINOGEN 0.2 04/12/2013 0900   NITRITE NEGATIVE 01/10/2023 0110   LEUKOCYTESUR LARGE (A) 01/10/2023 0110    Radiological Exams on Admission: DG Ribs Unilateral W/Chest Left  Result Date: 02/01/2023 CLINICAL DATA:  Fall.  Left chest wall pain EXAM: LEFT RIBS AND CHEST - 3 VIEW COMPARISON:  Chest x-ray 01/10/2023 and older FINDINGS: No consolidation, pneumothorax or effusion. No edema. Normal cardiopericardial silhouette. Overlapping cardiac leads. Mild eventration of the right hemidiaphragm. Degenerative changes of the shoulders. Degenerative changes along the visualized spine. No rib fracture seen. IMPRESSION: No rib fracture seen.  No pneumothorax. Electronically Signed   By: Karen Kays M.D.   On: 02/01/2023 11:44   CT HEAD WO CONTRAST ( )  Result Date: 02/01/2023 CLINICAL DATA:  Larey Seat with trauma to the head.  Anticoagulated. EXAM: CT HEAD WITHOUT CONTRAST  TECHNIQUE: Contiguous axial images were obtained from the base of the skull through the vertex without intravenous contrast. RADIATION DOSE REDUCTION: This exam was performed according to the departmental dose-optimization program which includes automated exposure control, adjustment of the mA and/or kV according to patient size and/or use of iterative reconstruction technique. COMPARISON:  None Available. FINDINGS: Brain: The brain has normal appearance without evidence of atrophy, old or acute infarction, mass lesion, hemorrhage, hydrocephalus or extra-axial collection. Vascular: There is atherosclerotic calcification of the major vessels at the base of the brain. Skull: Normal Sinuses/Orbits: Clear/normal Other: Left frontal scalp hematoma. IMPRESSION: 1. Left frontal scalp hematoma.  No underlying skull fracture. No intracranial injury. 2. Atherosclerotic calcification of the major vessels at the base of the brain. Electronically Signed   By: Paulina Fusi M.D.   On: 02/01/2023 11:34    EKG: Independently reviewed. Sinus rhythm at 90 bpm.  Minimal baseline wander.  Abnormal R wave progression.  Nonspecific intraventricular conduction delay with QRS of 108.  Assessment/Plan Active Problems:   Hyperlipidemia   Morbid obesity (HCC)   ANEMIA-IRON DEFICIENCY   Anxiety state   RESTLESS LEGS SYNDROME   Essential hypertension   Ulcerative colitis without complications (HCC)   CKD (chronic kidney disease) stage 4, GFR 15-29 ml/min (HCC)   Acute basilic vein thrombosis, right   Gout   Depression, major, single episode, severe (HCC)   Crohn's disease of both small and large intestine (HCC)   Ileostomy, has currently (HCC)   GERD without esophagitis   Hypocalcemia CKD 4 > Patient presenting after near syncope and fall as below.  Found incidentally to have gradually worsening calcium.  Creatinine remained stable at 3.47.  Bicarb low at 10 with no significant anion gap. > Calcium has been gradually  worsening over the last month or so from around 9 to now 6.8 when corrected.  Was 7.4 five days ago.  Suspicion for developing CKD-MBD. > Has received 1 g calcium gluconate in the ED. - Monitor on telemetry overnight - Will repeat CMP and trend - Check magnesium, phosphorus, PTH, vitamin D  Near syncope Fall > Occurred at transfusion center today.  Did not hit her head and is on anticoagulation however hematoma was noted but no other acute abnormality on CT head. > Suspected to be setting of orthostasis given her recurrent dehydration/AKI from high output ileostomy. > Did receive additional IV fluids in the ED. - Will monitor on telemetry overnight - Orthostatic vital signs - Hold off on echocardiogram  Crohn's disease Ulcerative colitis Ileostomy > As per HPI patient has history of Crohn's disease and ulcerative colitis status post surgical intervention now with high put ileostomy.  Recurrent dehydration including recent admission. > Receives transfusions twice a week of 1 L IV fluids. - Continue home cholestyramine, loperamide  GERD - Continue as needed Tums  Gout - Continue home allopurinol  Hypertension - Continue home metoprolol - Holding home amlodipine  Hyperlipidemia - Continue home cholestyramine  Depression Anxiety - Continue home duloxetine  Left upper extremity basilic vein thrombosis > Appears to have been secondary to an IV. - Continue Xarelto and doxycycline as prescribed by hematology several days ago.  DVT prophylaxis: Xarelto Code Status:   Full Family Communication:  None on admission  Disposition Plan:   Patient is from:  Home  Anticipated DC to:  Home  Anticipated DC date:  1 to 3 days  Anticipated DC barriers: None  Consults called:  None Admission status:  Observation, telemetry  Severity of Illness: The appropriate patient status for this patient is OBSERVATION. Observation status is judged to be reasonable and necessary in order to  provide the required intensity of service to ensure the patient's safety. The patient's presenting symptoms, physical exam findings, and initial radiographic and laboratory data in the context of their medical condition is felt to place them at decreased risk for further clinical deterioration. Furthermore, it is anticipated that the patient will be medically stable for discharge from the hospital within 2 midnights of admission.   Synetta Fail MD [facility]  How to contact the [facility] Attending or Consulting provider 7A -  7P or covering provider during after hours 7P -7A, for this patient?   Check the care team in [facility] and look for a) attending/consulting [facility] provider listed and b) the [facility] team listed Log into [email or web address] and use [place]'s universal password to access. If you do not have the password, please contact the hospital operator. Locate the [facility] provider you are looking for under [facility] and page to a number that you can be directly reached. If you still have difficulty reaching the provider, please page the [facility] (Director on Call) for the Hospitalists listed on amion for assistance.  02/01/2023, 2:51 PM

## 2023-02-01 NOTE — Progress Notes (Addendum)
Diagnosis: Dehydration  Provider:  Chilton Greathouse MD  Procedure: IV Infusion  IV Type: Peripheral, IV Location: L Antecubital  Normal Saline, Dose: 1000 ml  Infusion Start Time: 0825  Infusion Stop Time: 0940  Post Infusion IV Care: Peripheral IV Discontinued  Discharge: Condition: Good, Destination: Home . AVS Declined  Performed by:  Marilynn Rail, RN    Updated: Patient found outside in parking lot sitting on the curb. Reported by [employer] employees that patient had witnessed fall. Reported witness stated patient fell back on concrete and "she hit her head and I saw it bounce off the pavement." This RN assessed and patient A&Ox4 upon assessment. Patient reported pain on the left side of her head, Observed large bulge on the left side of her forehead upon assessment. EMS called. Patient stayed seated on curb, and NPO per instruction from emergency personnel on 911 call. EMS arrived. Patient walked to stretcher with assistance.

## 2023-02-01 NOTE — ED Provider Notes (Signed)
[place] EMERGENCY DEPARTMENT AT [facility] Provider Note   CSN: [id number] Arrival date & time: 02/01/23  1045     History  Chief Complaint  Patient presents with   Trauma    [name] is a 67 y.o. female.  The history is provided by the patient, medical records and the EMS personnel. No language interpreter was used.  Trauma Mechanism of injury: Fall Injury location: torso and head/neck Injury location detail: head and L chest Arrived directly from scene: yes   Fall:      Fall occurred: standing      Point of impact: head      Entrapped after fall: no      Suspicion of alcohol use: no      Suspicion of drug use: no  EMS/PTA data:      Ambulatory at scene: no      Blood loss: none      Responsiveness: alert      Loss of consciousness: no      Amnesic to event: no      Airway interventions: none  Current symptoms:      Associated symptoms:            Reports chest pain and headache.            Denies abdominal pain, back pain, loss of consciousness, nausea, neck pain and vomiting.       Home Medications Prior to Admission medications   Medication Sig Start Date End Date Taking? Authorizing Provider  acetaminophen (TYLENOL) 500 MG tablet Take 1,000 mg by mouth every 6 (six) hours as needed (for pain/sleep).    [provider]  allopurinol (ZYLOPRIM) 300 MG tablet Take 0.5 tablets (150 mg total) by mouth at bedtime. Patient taking differently: Take 300 mg by mouth at bedtime. 12/10/22   Seabron Spates R, DO  amLODipine (NORVASC) 5 MG tablet Take 5 mg by mouth See admin instructions. Take 5 mg by mouth once a day and hold for a low B/P reading 05/20/22   [provider]  Calcium Carbonate Antacid (TUMS EXTRA STRENGTH 750 PO) Take 1-2 tablets by mouth every 6 (six) hours as needed (CHEW for heartburn).    [provider]  cholestyramine light (PREVALITE) 4 g packet Take 1 packet (4 g total) by mouth 3 (three) times  daily after meals. 07/21/22   Willeen Niece, MD  doxycycline (VIBRA-TABS) 100 MG tablet Take 1 tablet (100 mg total) by mouth 2 (two) times daily for 7 days. 01/27/23 02/03/23  Rushie Chestnut, PA-C  DULoxetine (CYMBALTA) 60 MG capsule TAKE TWO CAPSULES (120 mg total) BY MOUTH DAILY AT 9AM Patient taking differently: Take 120 mg by mouth at bedtime. 01/07/23   [name], DO  folic acid (FOLVITE) 1 MG tablet Take 2 tablets (2 mg total) by mouth daily. Patient taking differently: Take 1 mg by mouth daily. 05/26/17   Josph Macho, MD  GAS-X EXTRA STRENGTH 125 MG CAPS Take 125 mg by mouth at bedtime. Patient not taking: Reported on 01/14/2023    [provider]  loperamide (IMODIUM A-D) 2 MG tablet Take 4 mg by mouth in the morning and at bedtime.    [provider]  metoprolol succinate (TOPROL-XL) 100 MG 24 hr tablet Take 100 mg by mouth at bedtime.    [provider]  OVER THE COUNTER MEDICATION Take 1 tablet by mouth See admin instructions. Nervive Nerve Health tablet-  Take 1 tablet by mouth at bedtime    [provider]  rivaroxaban (XARELTO) 10 MG TABS tablet Take 1 tablet (10 mg total) by mouth daily. 01/27/23   Rushie Chestnut, PA-C  Vitamin D, Ergocalciferol, (DRISDOL) 1.25 MG (50000 UNIT) CAPS capsule Take 50,000 Units by mouth every 30 (thirty) days.    [provider]      Allergies    Sulfa antibiotics, Morphine, and Sulfonamide derivatives    Review of Systems   Review of Systems  Constitutional:  Negative for chills, diaphoresis, fatigue and fever.  HENT:  Negative for congestion.   Eyes:  Negative for visual disturbance.  Respiratory:  Negative for cough, chest tightness, shortness of breath and wheezing.   Cardiovascular:  Positive for chest pain. Negative for palpitations.  Gastrointestinal:  Negative for abdominal pain, constipation, diarrhea, nausea and vomiting.  Genitourinary:  Negative for dysuria and flank  pain.  Musculoskeletal:  Negative for back pain, neck pain and neck stiffness.  Skin:  Negative for rash and wound.  Neurological:  Positive for headaches. Negative for dizziness, loss of consciousness, syncope (near syncope) and light-headedness.  Psychiatric/Behavioral:  Negative for agitation and confusion.   All other systems reviewed and are negative.   Physical Exam Updated Vital Signs SpO2 99%  Physical Exam Vitals and nursing note reviewed.  Constitutional:      General: She is not in acute distress.    Appearance: She is well-developed. She is not ill-appearing, toxic-appearing or diaphoretic.  HENT:     Head:      Comments: Pupils symmetric and reactive with normal extract movements.  Hematoma to left forehead.  No crepitance.    Nose: No congestion or rhinorrhea.     Mouth/Throat:     Mouth: Mucous membranes are dry.     Pharynx: No oropharyngeal exudate or posterior oropharyngeal erythema.  Eyes:     Extraocular Movements: Extraocular movements intact.     Conjunctiva/sclera: Conjunctivae normal.     Pupils: Pupils are equal, round, and reactive to light.  Cardiovascular:     Rate and Rhythm: Normal rate and regular rhythm.     Heart sounds: No murmur heard. Pulmonary:     Effort: Pulmonary effort is normal. No respiratory distress.     Breath sounds: Normal breath sounds. No wheezing, rhonchi or rales.  Chest:     Chest wall: Tenderness present.  Abdominal:     General: There is no distension.     Palpations: Abdomen is soft.     Tenderness: There is no abdominal tenderness. There is no right CVA tenderness, left CVA tenderness, guarding or rebound.  Musculoskeletal:        General: Tenderness present. No swelling.     Cervical back: Neck supple. No tenderness.  Skin:    General: Skin is warm and dry.     Capillary Refill: Capillary refill takes less than 2 seconds.     Findings: No erythema or rash.  Neurological:     General: No focal deficit present.      Mental Status: She is alert.     Sensory: No sensory deficit.     Motor: No weakness.  Psychiatric:        Mood and Affect: Mood normal.     ED Results / Procedures / Treatments   Labs (all labs ordered are listed, but only abnormal results are displayed) Labs Reviewed  CBC WITH DIFFERENTIAL/PLATELET - Abnormal; Notable for the following components:  Result Value   WBC 12.5 (*)    RBC 3.09 (*)    Hemoglobin 10.4 (*)    HCT 32.0 (*)    MCV 103.6 (*)    RDW 16.8 (*)    Platelets 410 (*)    Neutro Abs 9.0 (*)    Abs Immature Granulocytes 0.15 (*)    All other components within normal limits  COMPREHENSIVE METABOLIC PANEL - Abnormal; Notable for the following components:   Chloride 113 (*)    CO2 10 (*)    Glucose, Bld 124 (*)    BUN 47 (*)    Creatinine, Ser 3.47 (*)    Calcium 5.9 (*)    Total Protein 6.3 (*)    Albumin 2.9 (*)    GFR, Estimated 14 (*)    All other components within normal limits  COMPREHENSIVE METABOLIC PANEL  COMPREHENSIVE METABOLIC PANEL  COMPREHENSIVE METABOLIC PANEL  MAGNESIUM  PHOSPHORUS  VITAMIN D 25 HYDROXY (VIT D DEFICIENCY, FRACTURES)  PARATHYROID HORMONE, INTACT (NO CA)    EKG EKG Interpretation  Date/Time:  Tuesday Feb 01 2023 10:51:36 EDT Ventricular Rate:  90 PR Interval:  189 QRS Duration: 108 QT Interval:  395 QTC Calculation: 484 R Axis:   -47 Text Interpretation: Sinus rhythm Left anterior fascicular block Abnormal R-wave progression, early transition Left ventricular hypertrophy Anterior Q waves, possibly due to LVH when compared to prior, slowe rate. No STEMI Confirmed by Theda Belfast ([id number]) on 02/01/2023 11:16:15 AM  Radiology DG Ribs Unilateral W/Chest Left  Result Date: 02/01/2023 CLINICAL DATA:  Fall.  Left chest wall pain EXAM: LEFT RIBS AND CHEST - 3 VIEW COMPARISON:  Chest x-ray 01/10/2023 and older FINDINGS: No consolidation, pneumothorax or effusion. No edema. Normal cardiopericardial silhouette.  Overlapping cardiac leads. Mild eventration of the right hemidiaphragm. Degenerative changes of the shoulders. Degenerative changes along the visualized spine. No rib fracture seen. IMPRESSION: No rib fracture seen.  No pneumothorax. Electronically Signed   By: Karen Kays M.D.   On: 02/01/2023 11:44   CT HEAD WO CONTRAST ( )  Result Date: 02/01/2023 CLINICAL DATA:  Larey Seat with trauma to the head.  Anticoagulated. EXAM: CT HEAD WITHOUT CONTRAST TECHNIQUE: Contiguous axial images were obtained from the base of the skull through the vertex without intravenous contrast. RADIATION DOSE REDUCTION: This exam was performed according to the departmental dose-optimization program which includes automated exposure control, adjustment of the mA and/or kV according to patient size and/or use of iterative reconstruction technique. COMPARISON:  None Available. FINDINGS: Brain: The brain has normal appearance without evidence of atrophy, old or acute infarction, mass lesion, hemorrhage, hydrocephalus or extra-axial collection. Vascular: There is atherosclerotic calcification of the major vessels at the base of the brain. Skull: Normal Sinuses/Orbits: Clear/normal Other: Left frontal scalp hematoma. IMPRESSION: 1. Left frontal scalp hematoma. No underlying skull fracture. No intracranial injury. 2. Atherosclerotic calcification of the major vessels at the base of the brain. Electronically Signed   By: Paulina Fusi M.D.   On: 02/01/2023 11:34    Procedures Procedures    Medications Ordered in ED Medications  calcium gluconate 1 g/ 50 mL sodium chloride IVPB (1,000 mg Intravenous New Bag/Given 02/01/23 1417)  allopurinol (ZYLOPRIM) tablet 150 mg (has no administration in time range)  doxycycline (VIBRA-TABS) tablet 100 mg (has no administration in time range)  metoprolol succinate (TOPROL-XL) 24 hr tablet 100 mg (has no administration in time range)  cholestyramine light (PREVALITE) packet 4 g (has no administration  in time range)  DULoxetine (  CYMBALTA) DR capsule 120 mg (has no administration in time range)  loperamide (IMODIUM A-D) tablet 4 mg (has no administration in time range)  calcium carbonate (TUMS EX) chewable tablet 750 mg (has no administration in time range)  folic acid (FOLVITE) tablet 1 mg (has no administration in time range)  rivaroxaban (XARELTO) tablet 10 mg (has no administration in time range)  sodium chloride flush (NS) 0.9 % injection 3 mL (has no administration in time range)  acetaminophen (TYLENOL) tablet 650 mg (has no administration in time range)    Or  acetaminophen (TYLENOL) suppository 650 mg (has no administration in time range)  sodium chloride 0.9 % bolus 1,000 mL (0 mLs Intravenous Stopped 02/01/23 1339)    ED Course/ Medical Decision Making/ A&P                             Medical Decision Making Amount and/or Complexity of Data Reviewed Labs: ordered. Radiology: ordered.  Risk Prescription drug management. Decision regarding hospitalization.    [name] is a 67 y.o. female with a past medical history significant for ulcerative colitis, Crohn's, hypertension, GERD, previous small bowel obstruction with ostomy now, cholecystectomy, and CKD who gets biweekly fluid infusions for CKD who presents for near syncopal episode.  According to patient, she was getting her liter of fluids today and felt that she likely had a second 1 because she was still feeling dehydrated.  She said they were not able to increase it today as there was not a provider to increase the order.  She reports after standing up to leave the facility she started getting lightheaded.  She reports he never fully lost consciousness but got lightheaded and went to the ground.  She is complaining of some pain in her left head initially where she has a large hematoma but is not any neck pain.  She also reports some mild discomfort in her left lateral chest where she had the ground but is active  shortness of breath.  Denies any pain in her extremities or other complaints.  She reports was feeling well before her fall.  She reports she sometimes gets lightheaded but has not had a full nursing blood show like this.  Due to her taking blood thinners and hitting her head with evidence of trauma she was brought in for evaluation as a level 2 trauma.  I met patient when she arrived to the room.  On my exam, she has does have a hematoma to her left forehead.  Pupils are symmetric and reactive.  Speech was clear.  Lungs clear and chest nontender.  Abdomen nontender.  Patient moving all extremities.  Mild tenderness to her left lateral chest wall.  Otherwise no evidence of pneumothorax on initial auscultation.  Will get CT head and chest x-ray left rib focus.  Will get screening labs given the likely fluid shifting and the concern for near syncope from dehydration.  Will give her some fluids as this is what she was wanting initially and will monitor.  Anticipate reassessment after imaging labs and rehydration.   2:08 PM CT head and chest x-ray did not show evidence of traumatic injuries aside from a small soft tissue hematoma.  However, what was concerning was her calcium is now 5.9 and dropping.  1 month ago was 9.9 and now it is 5.9.  It appears to steadily been dropping over the last few weeks.  Unclear etiology.  I  spoke to pharmacy who was unsure what it may be from a medication standpoint or not.  Due to this near syncope and is dropping calcium, will call for admission and will order calcium gluconate as recommended by pharmacy.        Final Clinical Impression(s) / ED Diagnoses Final diagnoses:  Hypocalcemia  Near syncope  Trauma    Rx / DC Orders ED Discharge Orders     None       Clinical Impression: 1. Hypocalcemia   2. Near syncope   3. Trauma     Disposition: Admit  This note was prepared with assistance of Dragon voice recognition software. Occasional  wrong-word or sound-a-like substitutions may have occurred due to the inherent limitations of voice recognition software.      Elzada Pytel, Canary Brim, MD 02/01/23 1520

## 2023-02-02 ENCOUNTER — Ambulatory Visit: Payer: Medicare Other

## 2023-02-02 ENCOUNTER — Telehealth: Payer: Self-pay | Admitting: *Deleted

## 2023-02-02 LAB — CBC
HCT: 29.8 % — ABNORMAL LOW (ref 36.0–46.0)
Hemoglobin: 9.9 g/dL — ABNORMAL LOW (ref 12.0–15.0)
MCH: 33.4 pg (ref 26.0–34.0)
MCHC: 33.2 g/dL (ref 30.0–36.0)
MCV: 100.7 fL — ABNORMAL HIGH (ref 80.0–100.0)
Platelets: 338 10*3/uL (ref 150–400)
RBC: 2.96 MIL/uL — ABNORMAL LOW (ref 3.87–5.11)
RDW: 16.3 % — ABNORMAL HIGH (ref 11.5–15.5)
WBC: 12 10*3/uL — ABNORMAL HIGH (ref 4.0–10.5)
nRBC: 0 % (ref 0.0–0.2)

## 2023-02-02 LAB — COMPREHENSIVE METABOLIC PANEL
ALT: 24 U/L (ref 0–44)
ALT: 25 U/L (ref 0–44)
ALT: 22 U/L (ref 0–44)
AST: 29 U/L (ref 15–41)
AST: 32 U/L (ref 15–41)
AST: 28 U/L (ref 15–41)
Albumin: 3.3 g/dL — ABNORMAL LOW (ref 3.5–5.0)
Albumin: 3.3 g/dL — ABNORMAL LOW (ref 3.5–5.0)
Albumin: 3.3 g/dL — ABNORMAL LOW (ref 3.5–5.0)
Alkaline Phosphatase: 112 U/L (ref 38–126)
Alkaline Phosphatase: 114 U/L (ref 38–126)
Alkaline Phosphatase: 112 U/L (ref 38–126)
Anion gap: 16 — ABNORMAL HIGH (ref 5–15)
Anion gap: 13 (ref 5–15)
Anion gap: 12 (ref 5–15)
BUN: 49 mg/dL — ABNORMAL HIGH (ref 8–23)
BUN: 44 mg/dL — ABNORMAL HIGH (ref 8–23)
BUN: 46 mg/dL — ABNORMAL HIGH (ref 8–23)
CO2: 13 mmol/L — ABNORMAL LOW (ref 22–32)
CO2: 14 mmol/L — ABNORMAL LOW (ref 22–32)
CO2: 16 mmol/L — ABNORMAL LOW (ref 22–32)
Calcium: 6.9 mg/dL — ABNORMAL LOW (ref 8.9–10.3)
Calcium: 7 mg/dL — ABNORMAL LOW (ref 8.9–10.3)
Calcium: 7.4 mg/dL — ABNORMAL LOW (ref 8.9–10.3)
Chloride: 109 mmol/L (ref 98–111)
Chloride: 111 mmol/L (ref 98–111)
Chloride: 109 mmol/L (ref 98–111)
Creatinine, Ser: 3.39 mg/dL — ABNORMAL HIGH (ref 0.44–1.00)
Creatinine, Ser: 3.33 mg/dL — ABNORMAL HIGH (ref 0.44–1.00)
Creatinine, Ser: 3.22 mg/dL — ABNORMAL HIGH (ref 0.44–1.00)
GFR, Estimated: 14 mL/min — ABNORMAL LOW (ref >60)
GFR, Estimated: 14 mL/min — ABNORMAL LOW (ref >60)
GFR, Estimated: 15 mL/min — ABNORMAL LOW (ref >60)
Glucose, Bld: 123 mg/dL — ABNORMAL HIGH (ref 70–99)
Glucose, Bld: 120 mg/dL — ABNORMAL HIGH (ref 70–99)
Glucose, Bld: 123 mg/dL — ABNORMAL HIGH (ref 70–99)
Potassium: 3.1 mmol/L — ABNORMAL LOW (ref 3.5–5.1)
Potassium: 3.1 mmol/L — ABNORMAL LOW (ref 3.5–5.1)
Potassium: 3.2 mmol/L — ABNORMAL LOW (ref 3.5–5.1)
Sodium: 138 mmol/L (ref 135–145)
Sodium: 138 mmol/L (ref 135–145)
Sodium: 137 mmol/L (ref 135–145)
Total Bilirubin: 0.9 mg/dL (ref 0.3–1.2)
Total Bilirubin: 0.7 mg/dL (ref 0.3–1.2)
Total Bilirubin: 0.7 mg/dL (ref 0.3–1.2)
Total Protein: 6.9 g/dL (ref 6.5–8.1)
Total Protein: 7 g/dL (ref 6.5–8.1)
Total Protein: 6.7 g/dL (ref 6.5–8.1)

## 2023-02-02 LAB — PARATHYROID HORMONE, INTACT (NO CA): PTH: 88 pg/mL — ABNORMAL HIGH (ref 15–65)

## 2023-02-02 LAB — MAGNESIUM: Magnesium: 1.2 mg/dL — ABNORMAL LOW (ref 1.7–2.4)

## 2023-02-02 MED ORDER — CHOLESTYRAMINE LIGHT 4 G PO PACK
4.0000 g | PACK | Freq: Three times a day (TID) | ORAL | Status: DC
  Administered 2023-02-02 – 2023-02-03 (×3): 4 g via ORAL
  Filled 2023-02-02 (×6): qty 1

## 2023-02-02 MED ORDER — APIXABAN 5 MG PO TABS
5.0000 mg | ORAL_TABLET | Freq: Two times a day (BID) | ORAL | Status: DC
  Administered 2023-02-02 – 2023-02-03 (×2): 5 mg via ORAL
  Filled 2023-02-02 (×2): qty 1

## 2023-02-02 MED ORDER — SODIUM CHLORIDE 0.45 % IV SOLN
INTRAVENOUS | Status: DC
  Filled 2023-02-02 (×6): qty 75

## 2023-02-02 MED ORDER — MAGNESIUM SULFATE 2 GM/50ML IV SOLN
2.0000 g | Freq: Once | INTRAVENOUS | Status: AC
  Administered 2023-02-02: 2 g via INTRAVENOUS
  Filled 2023-02-02: qty 50

## 2023-02-02 MED ORDER — POTASSIUM CHLORIDE 10 MEQ/100ML IV SOLN
10.0000 meq | INTRAVENOUS | Status: DC
  Filled 2023-02-02: qty 100

## 2023-02-02 MED ORDER — POTASSIUM CHLORIDE CRYS ER 20 MEQ PO TBCR
40.0000 meq | EXTENDED_RELEASE_TABLET | ORAL | Status: AC
  Administered 2023-02-02 (×2): 40 meq via ORAL
  Filled 2023-02-02 (×2): qty 2

## 2023-02-02 MED ORDER — CALCIUM GLUCONATE-NACL 2-0.675 GM/100ML-% IV SOLN
2.0000 g | Freq: Once | INTRAVENOUS | Status: AC
  Administered 2023-02-02: 2000 mg via INTRAVENOUS
  Filled 2023-02-02: qty 100

## 2023-02-02 NOTE — Progress Notes (Signed)
PROGRESS NOTE  [name]  DOB: [birth year]  PCP: [name], [place] ZOX:[id number]  DOA: 02/01/2023  LOS: 0 days  Hospital Day: 2  Brief narrative: [name] is a 67 y.o. 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 Most recent hospitalization 5/6 to 5/9 for abdominal pain, nausea, vomiting that led to AKI managed with IV patient, and symptom control. Patient receives IV fluid twice a week at [facility] health outpatient infusion center because of recurrent dehydration from high ostomy output.   5/28, patient received IV fluid at the infusion as before.  After she stood up to leave the facility, she started getting lightheaded.  Patient reports she did not pass out but fell to the floor and hit her head.  Fall was witnessed by the the employees of the infusion center  EMS was called and patient was sent to the ED.  ED, patient was afebrile, heart rate in 90s, blood pressure 122/74 Initial labs with WC count 12.5, hemoglobin 10.4, BUN/creatinine 47/3.47, calcium low at 5.9, albumin low at 2.9 Left rib and chest x-ray showed no acute abnormality.   CT head showed left scalp hematoma but no underlying fracture, also showed atherosclerosis of major vessels of the brain.  Patient received 1 g calcium gluconate and a liter of IV fluids in the ED. Admitted to [facility]  Subjective: Patient was seen and examined this morning.  Pleasant elderly Caucasian female.  Lying down in bed.  Not in distress.  No new symptoms.  Continues to have ostomy output. Overnight, afebrile, hemodynamically stable  Assessment and plan: Orthostatic hypotension Near syncope Falls Patient brought to the ED after an episode of dizziness leading to fall.  Patient reports  not passing out. She has a high output ostomy which probably led to orthostatic dizziness and fall. Continue IV hydration. Monitor orthostatics  Acute on chronic metabolic acidosis CKD 5 IBD s/p ileostomy High ostomy output Patient receives IV fluid twice a week at [facility] health outpatient infusion center because of recurrent dehydration from high ostomy output.  It seems, patient also receives a monthly shot of octreotide 20 mg IM under the care of Dr. Adela Lank to reduce diarrhea. Creatinine and bicarb trend as below.  Creatinine seems to be high but at baseline.  Serum bicarb level seems to be acutely low, due to GI bicarb loss.   Start IV hydration with sodium bicarb drip. Continue cholestyramine, loperamide Recent Labs    01/10/23 1304 01/11/23 0533 01/12/23 0540 01/13/23 0545 01/27/23 1345 02/01/23 1217 02/01/23 1518 02/01/23 2033 02/02/23 0237 02/02/23 0916  BUN 61* 53* 52* 47* 46* 47* 47* 47* 49* 44*  CREATININE 4.88* 4.83* 4.00* 3.57* 3.88* 3.47* 3.42* 3.51* 3.39* 3.33*  CO2 13* 22 20* 20* 15* 10* 12* 14* 13* 14*   Hypokalemia/hypomagnesemia IV and oral replacement ordered.  Continue to monitor. Recent Labs  Lab 02/01/23 1217 02/01/23 1518 02/01/23 2033 02/02/23 0237 02/02/23 0916  K 3.5 3.6 3.0* 3.1* 3.1*  MG  --  0.7*  --   --  1.2*  PHOS  --  4.5  --   --   --    Acute on chronic hypocalcemia Calcium level on admission was low at 5.9, which is lower than baseline.  Probably part of dehydration and GI loss.  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 Patient has received 1 g calcium gluconate in the ED. Vitamin D level low at 28. Pending PTH level Nephrologist Dr. Arrie Aran as an outpatient. Monitor in telemetry Recent Labs    01/12/23 0540 01/13/23 0545 02/01/23 1217 02/01/23 1507 02/01/23 1518 02/01/23 2033 02/02/23 0237 02/02/23 0916  CALCIUM 7.5*   < > 5.9*  --  6.8* 6.8* 6.9* 7.0*  PHOS 2.8  --   --   --  4.5  --   --   --   PTH  --   --   --  88*   --   --   --   --    < > = values in this interval not displayed.   GERD Continue as needed Tums   Gout Continue home allopurinol   Hypertension Continue home metoprolol.  Amlodipine on hold  Anxiety/depression Continue Cymbalta   Left upper extremity basilic vein thrombosis Appears to be secondary to an IV. Continue Xarelto and doxycycline as prescribed by hematology several days ago. Defer to oncologist Dr. Myna Hidalgo for anticoagulation plan     Mobility: Encourage ambulation  Goals of care   Code Status: Full Code     DVT prophylaxis:   apixaban (ELIQUIS) tablet 5 mg   Antimicrobials: Doxycycline???.  I do not see a clear indication.  I will stop it at this time. Fluid: Sodium bicarbonate at 125 mill per hour Consultants: None Family Communication: None at bedside  Status: Observation Level of care:  Telemetry Medical   Patient from: Home Anticipated d/c to: Pending clinical course Needs to continue in-hospital care:  Needs aggressive IV fluid and electrolyte replacement    Diet:  Diet Order             Diet renal with fluid restriction Fluid restriction: 2000 mL Fluid; Room service appropriate? Yes; Fluid consistency: Thin  Diet effective now                   Scheduled Meds:  allopurinol  150 mg Oral QHS   apixaban  5 mg Oral BID   cholestyramine light  4 g Oral TID PC   DULoxetine  120 mg Oral QHS   folic acid  1 mg Oral Daily   loperamide  4 mg Oral BID   metoprolol succinate  100 mg Oral QHS   sodium chloride flush  3 mL Intravenous Q12H    PRN meds: acetaminophen **OR** acetaminophen, calcium carbonate   Infusions:   magnesium sulfate bolus IVPB     sodium bicarbonate 75 mEq in sodium chloride 0.45 % 1,075 mL infusion 125 mL/hr at 02/02/23 1109    Antimicrobials: Anti-infectives (From admission, onward)    Start     Dose/Rate Route Frequency Ordered Stop   02/01/23 2200  doxycycline (VIBRA-TABS) tablet 100 mg  Status:   Discontinued        100 mg Oral 2 times daily 02/01/23 1507 02/02/23 1525       Nutritional status:  Body mass index is 33.7 kg/m.          Objective: Vitals:   02/02/23 0435 02/02/23 0805  BP: (!) 140/89 (!) 145/92  Pulse: 78 85  Resp: 18 20  Temp: 98 F (36.7 C) 97.6 F (36.4 C)  SpO2: 99% 100%    Intake/Output Summary (Last 24 hours) at 02/02/2023 1529 Last data filed at 02/01/2023 2313 Gross per 24 hour  Intake 240 ml  Output --  Net 240 ml  Filed Weights   02/01/23 1053 02/01/23 1621  Weight: 84.8 kg 86.3 kg   Weight change:  Body mass index is 33.7 kg/m.   Physical Exam: General exam: Pleasant, elderly Caucasian female.  Not in physical distress Skin: No rashes, lesions or ulcers. HEENT: Atraumatic, normocephalic, no obvious bleeding Lungs: Clear to auscultation bilaterally CVS: Regular rate and rhythm, no murmur GI/Abd soft, nontender, nondistended, bowel sound present.  Ostomy with output CNS: Alert, awake, oriented x 3 Psychiatry: Mood appropriate Extremities: No pedal edema, no calf tenderness  Data Review: I have personally reviewed the laboratory data and studies available.  F/u labs ordered Unresulted Labs (From admission, onward)     Start     Ordered   02/01/23 1451  Comprehensive metabolic panel  Now then every 6 hours,   R      02/01/23 1507            Total time spent in review of labs and imaging, patient evaluation, formulation of plan, documentation and communication with family: 55 minutes  Signed, Lorin Glass, MD [facility] 02/02/2023

## 2023-02-02 NOTE — Progress Notes (Signed)
PT Cancellation Note  Patient Details Name: [name] MRN: [id number] DOB: [birth year]   Cancelled Treatment:    Reason Eval/Treat Not Completed: PT screened, no needs identified, will sign off PT orders received, chart reviewed. Pt received in bed, reporting she is ambulating to/from the bathroom without issue & is at her baseline with functional mobility. PT to complete orders at this time; please re-consult if new needs arise.  Aleda Grana, PT, DPT 02/02/23, 2:55 PM  Sandi Mariscal 02/02/2023, 2:54 PM

## 2023-02-02 NOTE — Telephone Encounter (Signed)
Per scheduling message [name] - Called and lvm of upcoming appointments - requested call back to confirm.

## 2023-02-02 NOTE — Care Management Obs Status (Cosign Needed)
MEDICARE OBSERVATION STATUS NOTIFICATION   Patient Details  Name: Holy Com[place] MRN: [id number] Date of Birth: 1955-10-03   Medicare Observation Status Notification Given:  Yes    Janae Bridgeman, RN 02/02/2023, 10:51 AM

## 2023-02-02 NOTE — Plan of Care (Signed)

## 2023-02-02 NOTE — Progress Notes (Addendum)
Pt was started on Xarelto outpt for DVT of left basilic vein without load. Ok to change Xarelto to apixaban 5mg  PO BID per Dr Myna Hidalgo due to her CKD.  Ulyses Southward, PharmD, BCIDP, AAHIVP, CPP Infectious Disease Pharmacist 02/02/2023 9:17 AM

## 2023-02-02 NOTE — Discharge Instructions (Signed)
Information on my medicine - ELIQUIS (apixaban)  This medication education was reviewed with me or my healthcare representative as part of my discharge preparation.  The pharmacist that spoke with me during my hospital stay was:  Ulyses Southward, RPH-CPP  Why was Eliquis prescribed for you? Eliquis was prescribed to treat blood clots that may have been found in the veins of your legs (deep vein thrombosis) or in your lungs (pulmonary embolism) and to reduce the risk of them occurring again.  What do You need to know about Eliquis ? Continue to take Eliquis 5 mg tablet taken TWICE daily.  Eliquis may be taken with or without food.   Try to take the dose about the same time in the morning and in the evening. If you have difficulty swallowing the tablet whole please discuss with your pharmacist how to take the medication safely.  Take Eliquis exactly as prescribed and DO NOT stop taking Eliquis without talking to the doctor who prescribed the medication.  Stopping may increase your risk of developing a new blood clot.  Refill your prescription before you run out.  After discharge, you should have regular check-up appointments with your healthcare provider that is prescribing your Eliquis.    What do you do if you miss a dose? If a dose of ELIQUIS is not taken at the scheduled time, take it as soon as possible on the same day and twice-daily administration should be resumed. The dose should not be doubled to make up for a missed dose.  Important Safety Information A possible side effect of Eliquis is bleeding. You should call your healthcare provider right away if you experience any of the following: Bleeding from an injury or your nose that does not stop. Unusual colored urine (red or dark brown) or unusual colored stools (red or black). Unusual bruising for unknown reasons. A serious fall or if you hit your head (even if there is no bleeding).  Some medicines may interact with Eliquis  and might increase your risk of bleeding or clotting while on Eliquis. To help avoid this, consult your healthcare provider or pharmacist prior to using any new prescription or non-prescription medications, including herbals, vitamins, non-steroidal anti-inflammatory drugs (NSAIDs) and supplements.  This website has more information on Eliquis (apixaban): http://www.eliquis.com/eliquis/home

## 2023-02-02 NOTE — TOC Initial Note (Signed)
Transition of Care [facility]) - Initial/Assessment Note    Patient Details  Name: [name] MRN: [id number] Date of Birth: March 30, 1956  [facility] CM/SW Contact:    Janae Bridgeman, RN Phone Number: 02/02/2023, 11:20 AM  Clinical Narrative:                 CM met with the patient at the bedside to discuss TOC needs - none at this time.  The patient admitted to the hospital for near syncopal episode.  The patient is receiving calcium infusions today.  The patient lives with her significant other at the home and is independent.  She has RW and [employer] that she does not use.  The patient was provided with Medicare Observation notice at the bedside.  No CM needs at this time and patient will likely discharge home with family when medically stable for discharge.  Expected Discharge Plan: Home/Self Care Barriers to Discharge: Continued Medical Work up   Patient Goals and CMS Choice Patient states their goals for this hospitalization and ongoing recovery are:: To return home CMS Medicare.gov Compare Post Acute Care list provided to:: Patient Choice offered to / list presented to : Patient [place] ownership interest in [facility].provided to:: Patient    Expected Discharge Plan and Services   Discharge Planning Services: CM Consult Post Acute Care Choice: Resumption of Svcs/PTA Provider Living arrangements for the past 2 months: Single Family Home                                      Prior Living Arrangements/Services Living arrangements for the past 2 months: Single Family Home Lives with:: Significant Other Patient language and need for interpreter reviewed:: Yes Do you feel safe going back to the place where you live?: Yes      Need for Family Participation in Patient Care: Yes (Comment) Care giver support system in place?: Yes (comment) Current home services: DME ([employer] and RW at the home - does not use at this time) Criminal  Activity/Legal Involvement Pertinent to Current Situation/Hospitalization: No - Comment as needed  Activities of Daily Living Home Assistive Devices/Equipment: Dentures (specify type), Eyeglasses ADL Screening (condition at time of admission) Patient's cognitive ability adequate to safely complete daily activities?: Yes Is the patient deaf or have difficulty hearing?: No Does the patient have difficulty seeing, even when wearing glasses/contacts?: No Does the patient have difficulty concentrating, remembering, or making decisions?: No Patient able to express need for assistance with ADLs?: Yes Does the patient have difficulty dressing or bathing?: No Independently performs ADLs?: Yes (appropriate for developmental age) Does the patient have difficulty walking or climbing stairs?: No Weakness of Legs: None Weakness of Arms/Hands: None  Permission Sought/Granted Permission sought to share information with : Case Manager, Family Supports                Emotional Assessment Appearance:: Appears stated age Attitude/Demeanor/Rapport: Gracious Affect (typically observed): Accepting Orientation: : Oriented to Self, Oriented to Place, Oriented to  Time, Oriented to Situation Alcohol / Substance Use: Not Applicable Psych Involvement: No (comment)  Admission diagnosis:  Hypocalcemia [E83.51] Trauma [T14.90XA] Near syncope [R55] Patient Active Problem List   Diagnosis Date Noted   Hypocalcemia 02/01/2023   High output ileostomy (HCC) 07/16/2022   Nausea & vomiting 07/16/2022   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 07/08/2022   High anion  gap metabolic acidosis 07/06/2022   GERD without esophagitis 07/06/2022   Orthostatic hypotension 07/06/2022   Ileostomy, has currently (HCC) 06/30/2022   Urinary frequency 03/05/2022   Neck pain 12/08/2021   Facial numbness 12/08/2021   Tooth abscess 11/10/2021   Surgical wound breakdown, initial encounter 05/27/2021   Crohn's  disease of both small and large intestine (HCC) 05/20/2021   Thrombocytosis 05/14/2021   SIRS (systemic inflammatory response syndrome) (HCC) 05/14/2021   Chronic pain of left knee 03/10/2021   Depression, major, single episode, severe (HCC) 12/18/2020   Closed fracture of left tibial plateau 10/30/2020   Diarrhea with dehydration 09/21/2020   Chronic diarrhea 08/23/2020   Metabolic acidosis 08/23/2020   Hyperglycemia 02/26/2020   Immunologic deficiency syndrome (HCC) 02/26/2020   Prothrombin gene mutation (HCC) 02/26/2020   Acute pain of left shoulder 06/04/2019   Pes anserine bursitis 04/12/2018   Low back pain 06/21/2017   Gout 06/21/2017   Superficial thrombophlebitis of right upper extremity 03/23/2017   Acute basilic vein thrombosis, right 02/16/2017   Symptomatic anemia 01/15/2017   Rectal pain 12/01/2015   IBD (inflammatory bowel disease) 10/24/2015   Anal stricture 10/24/2015   Ovarian cyst, right 06/12/2014   Acute on chronic renal failure (HCC) 04/02/2013   Hydrosalpinx 02/14/2013   Hyperparathyroidism (HCC) 09/18/2012   HSV (herpes simplex virus) anogenital infection 06/28/2012   CKD (chronic kidney disease) stage 4, GFR 15-29 ml/min (HCC) 06/17/2012   LLQ abdominal pain 06/22/2011   RESTLESS LEGS SYNDROME 08/05/2009   PULMONARY NODULE, RIGHT UPPER LOBE 02/28/2009   TOBACCO ABUSE, HX OF 02/11/2009   PAIN IN JOINT, MULTIPLE SITES 02/04/2009   Nonspecific (abnormal) findings on radiological and other examination of body structure 12/23/2008   CT, CHEST, ABNORMAL 12/23/2008   HEMANGIOMA SIMPLEX 12/16/2008   SKIN RASH 12/16/2008   TINEA CORPORIS 04/18/2008   LATERAL EPICONDYLITIS, RIGHT 04/18/2008   Morbid obesity (HCC) 02/05/2008   ANAL STENOSIS 02/05/2008   Stool culture positive for Clostridioides difficile 02/05/2008   Personal history of other endocrine, metabolic, and immunity disorders 02/05/2008   History of Crohn's disease 02/05/2008   Syncope and collapse  02/02/2008   Dehydration 12/15/2007   ANEMIA-IRON DEFICIENCY 12/15/2007   LEUKOCYTOSIS 12/15/2007   Anxiety state 12/15/2007   Disorder resulting from impaired renal function 11/16/2007   TINNITUS NOS 05/25/2007   DE QUERVAIN'S TENOSYNOVITIS 05/25/2007   Hyperlipidemia 10/23/2006   Essential hypertension 10/23/2006   Ulcerative colitis without complications (HCC) 07/28/2004   GASTRIC POLYP 11/16/2001   PCP:  [name], DO Pharmacy:   [id number] - [place], [place] - [address] [place] [address] Phone: [phone] Fax: [phone]  [facility] [place] - [address] [place] [address] Phone: [phone] Fax: [phone]  [id number] - [place],  - [address] [place] [address] Phone: [phone] Fax: [phone]     Social Determinants of Health (SDOH) Social History: SDOH Screenings   Food Insecurity: No Food Insecurity (02/01/2023)  Housing: Low Risk  (02/01/2023)  Transportation Needs: No Transportation Needs (02/01/2023)  Utilities: Not At Risk (02/01/2023)  Alcohol Screen: Low Risk  (05/26/2022)  Depression (PHQ2-9): Low Risk  (08/03/2022)  Financial Resource Strain: Low Risk  (05/26/2022)  Physical Activity: Inactive (05/26/2022)  Social Connections: Moderately Isolated (05/26/2022)  Stress: No Stress Concern Present (05/26/2022)  Tobacco Use: Medium Risk (02/01/2023)   SDOH Interventions:  Readmission Risk Interventions    01/12/2023   10:41 AM 05/15/2021    1:42 PM 12/10/2020   10:57 AM  Readmission Risk Prevention Plan  Transportation Screening Complete Complete Complete  PCP or Specialist Appt within 5-7 Days Complete    PCP or Specialist Appt within 3-5 Days   Complete  Home Care Screening Complete    Medication Review (RN CM) Complete    HRI or Home Care Consult  Complete Complete  Social Work Consult for Recovery Care  Planning/Counseling  Complete Complete  Palliative Care Screening  Not Applicable Not Applicable  Medication Review Oceanographer)   Complete

## 2023-02-03 LAB — CBC
HCT: 28.6 % — ABNORMAL LOW (ref 36.0–46.0)
Hemoglobin: 9.2 g/dL — ABNORMAL LOW (ref 12.0–15.0)
MCH: 32.7 pg (ref 26.0–34.0)
MCHC: 32.2 g/dL (ref 30.0–36.0)
MCV: 101.8 fL — ABNORMAL HIGH (ref 80.0–100.0)
Platelets: 328 10*3/uL (ref 150–400)
RBC: 2.81 MIL/uL — ABNORMAL LOW (ref 3.87–5.11)
RDW: 16.2 % — ABNORMAL HIGH (ref 11.5–15.5)
WBC: 8.1 10*3/uL (ref 4.0–10.5)
nRBC: 0 % (ref 0.0–0.2)

## 2023-02-03 LAB — BASIC METABOLIC PANEL
Anion gap: 13 (ref 5–15)
BUN: 47 mg/dL — ABNORMAL HIGH (ref 8–23)
CO2: 18 mmol/L — ABNORMAL LOW (ref 22–32)
Calcium: 7.1 mg/dL — ABNORMAL LOW (ref 8.9–10.3)
Chloride: 107 mmol/L (ref 98–111)
Creatinine, Ser: 2.96 mg/dL — ABNORMAL HIGH (ref 0.44–1.00)
GFR, Estimated: 17 mL/min — ABNORMAL LOW (ref >60)
Glucose, Bld: 119 mg/dL — ABNORMAL HIGH (ref 70–99)
Potassium: 3.5 mmol/L (ref 3.5–5.1)
Sodium: 138 mmol/L (ref 135–145)

## 2023-02-03 MED ORDER — APIXABAN 5 MG PO TABS: 5.0000 mg | ORAL_TABLET | Freq: Two times a day (BID) | ORAL | 0 refills | Status: DC

## 2023-02-03 MED ORDER — POTASSIUM CHLORIDE CRYS ER 20 MEQ PO TBCR
40.0000 meq | EXTENDED_RELEASE_TABLET | ORAL | Status: DC
  Administered 2023-02-03: 40 meq via ORAL
  Filled 2023-02-03: qty 2

## 2023-02-03 NOTE — Discharge Summary (Signed)
Physician Discharge Summary  [name] ZOX:[id number] DOB: 1955-09-28 DOA: 02/01/2023  PCP: [name], DO  Admit date: 02/01/2023 Discharge date: 02/03/2023  Admitted From: Home Discharge disposition: Home  Recommendations at discharge:  Continue adequate oral hydration Continue cholestyramine and loperamide at home Continue to follow-up at infusion center Xarelto has been switched to Eliquis after discussion with Dr. Myna Hidalgo   Brief narrative: [name] is a 67 y.o. 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 Most recent hospitalization 5/6 to 5/9 for abdominal pain, nausea, vomiting that led to AKI managed with IV patient, and symptom control. Patient receives IV fluid twice a week at [facility] health outpatient infusion center because of recurrent dehydration from high ostomy output.   5/28, patient received IV fluid at the infusion as before.  After she stood up to leave the facility, she started getting lightheaded.  Patient reports she did not pass out but fell to the floor and hit her head.  Fall was witnessed by the the employees of the infusion center  EMS was called and patient was sent to the ED.  ED, patient was afebrile, heart rate in 90s, blood pressure 122/74 Initial labs with WC count 12.5, hemoglobin 10.4, BUN/creatinine 47/3.47, calcium low at 5.9, albumin low at 2.9 Left rib and chest x-ray showed no acute abnormality.   CT head showed left scalp hematoma but no underlying fracture, also showed atherosclerosis of major vessels of the brain.  Patient received 1 g calcium gluconate and a liter of IV fluids in the ED. Admitted to [facility]  Subjective: Patient was seen and examined this morning.  Sitting up at the  edge of the bed.  Not in distress.  No new symptoms.  Ostomy output much improved. Labs from this morning showed severe improvement.  Hospital course : Orthostatic hypotension Near syncope Falls Patient brought to the ED after an episode of dizziness leading to fall.  Patient reports not passing out. She has a high output ostomy which probably led to orthostatic dizziness and fall. She was given IV IV hydration.  Ostomy output was monitored.  Patient has been able to ambulate inside the room without dizziness or fall.  She feels ready for discharge.  Acute on chronic metabolic acidosis CKD 5 IBD s/p ileostomy High ostomy output Patient receives IV fluid twice a week at [facility] health outpatient infusion center because of recurrent dehydration from high ostomy output.  It seems, patient also receives a monthly shot of octreotide 20 mg IM under the care of Dr. Adela Lank to reduce diarrhea. Creatinine and bicarb trend as below.  Creatinine level was high but at baseline.  Serum bicarb level was not to be acutely low, due to GI bicarb loss.   Patient was started on IV hydration with sodium bicarb drip.  Adequate IV hydration given. She is chronically on cholestyramine and loperamide which was continued.  Ostomy output was improved.  Continue the same at home.  Continue regular follow-up at the infusion center Recent Labs    01/12/23 0540 01/13/23 0545 01/27/23 1345 02/01/23 1217 02/01/23 1518 02/01/23 2033 02/02/23 0237 02/02/23 0916 02/02/23 1446 02/03/23 0842  BUN 52* 47* 46* 47* 47* 47* 49* 44* 46* 47*  CREATININE 4.00* 3.57* 3.88* 3.47* 3.42* 3.51* 3.39* 3.33* 3.22* 2.96*  CO2 20* 20* 15* 10* 12* 14* 13* 14* 16* 18*   Hypokalemia/hypomagnesemia Aggressively reduce potassium level. This morning, potassium 3.5.  Give further replacement prior to discharge.   Recent Labs  Lab 02/01/23 1518 02/01/23 2033 02/02/23 0237 02/02/23 0916 02/02/23 1446 02/03/23 0842  K 3.6 3.0* 3.1*  3.1* 3.2* 3.5  MG 0.7*  --   --  1.2*  --   --   PHOS 4.5  --   --   --   --   --    Acute on chronic hypocalcemia Calcium level on admission was low at 5.9, which is lower than baseline.  Probably part of dehydration and GI loss.  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 Vitamin D level low at 28. PTH level elevated. IV calcium replacement was given Nephrologist Dr. Arrie Aran as an outpatient.  GERD Continue as needed Tums   Gout Continue home allopurinol   Hypertension Continue home metoprolol and amlodipine  Anxiety/depression Continue Cymbalta   Left upper extremity basilic vein thrombosis Appears to be secondary to an IV infiltration.. Was started on a course of Xarelto by hematology Dr. Myna Hidalgo.  Pharmacist and oncologist discussed and switched the patient to Eliquis.  Prescription given at discharge.    Mobility: Encourage ambulation  Goals of care   Code Status: Full Code   Wounds:  -    Discharge Exam:   Vitals:   02/02/23 2026 02/03/23 0012 02/03/23 0454 02/03/23 0811  BP: (!) 149/91 130/71 (!) 172/104 121/76  Pulse: 87 84 72 79  Resp: 18 18 18 17   Temp: 98.3 F (36.8 C) 97.9 F (36.6 C) 97.7 F (36.5 C) 97.6 F (36.4 C)  TempSrc:  Oral Oral Oral  SpO2: 97% 98% 98% 100%  Weight:      Height:        Body mass index is 33.7 kg/m.   General exam: Pleasant, elderly Caucasian female.  Not in physical distress Skin: No rashes, lesions or ulcers. HEENT: Atraumatic, normocephalic, no obvious bleeding Lungs: Clear to auscultation bilaterally CVS: Regular rate and rhythm, no murmur GI/Abd soft, nontender, nondistended, bowel sound present.  Ostomy with improved output CNS: Alert, awake, oriented x 3 Psychiatry: Mood appropriate Extremities: No pedal edema, no calf tenderness  Follow ups:    Follow-up Information     [name], DO Follow up.   Specialty: Family Medicine Contact information: [address] [place]  [phone]                 Discharge Instructions:   Discharge Instructions     Call MD for:  difficulty breathing, headache or visual disturbances   Complete by: As directed    Call MD for:  extreme fatigue   Complete by: As directed    Call MD for:  hives   Complete by: As directed    Call MD for:  persistant dizziness or light-headedness   Complete by: As directed    Call MD for:  persistant nausea and vomiting   Complete by: As directed    Call MD for:  severe uncontrolled pain   Complete by: As directed    Call MD for:  temperature >100.4   Complete by: As directed    Diet general   Complete by: As directed    Discharge instructions   Complete by: As directed    Recommendations at discharge:   Continue adequate oral hydration  Continue cholestyramine and loperamide at home  Continue to follow-up at infusion center  Xarelto has been switched to Eliquis after discussion with Dr. Myna Hidalgo  General discharge instructions: Follow with Primary MD Zola Button, Grayling Congress, DO in 7 days  Please request your PCP  to go over your hospital tests, procedures, radiology results at the follow up. Please get your medicines reviewed and adjusted.  Your PCP may decide to repeat certain labs or tests as needed. Do not drive, operate heavy machinery, perform activities at heights, swimming or participation in water activities or provide baby sitting services if your were admitted for syncope or siezures until you have seen by Primary MD or a Neurologist and advised to do so again. [place] Controlled Substance Reporting System database was reviewed. Do not drive, operate heavy machinery, perform activities at heights, swim, participate in water activities or provide baby-sitting services while on medications for pain, sleep and mood until your outpatient physician has reevaluated you and advised to do so again.  You are strongly recommended to comply with the dose,  frequency and duration of prescribed medications. Activity: As tolerated with Full fall precautions use walker/cane & assistance as needed Avoid using any recreational substances like cigarette, tobacco, alcohol, or non-prescribed drug. If you experience worsening of your admission symptoms, develop shortness of breath, life threatening emergency, suicidal or homicidal thoughts you must seek medical attention immediately by calling 911 or calling your MD immediately  if symptoms less severe. You must read complete instructions/literature along with all the possible adverse reactions/side effects for all the medicines you take and that have been prescribed to you. Take any new medicine only after you have completely understood and accepted all the possible adverse reactions/side effects.  Wear Seat belts while driving. You were cared for by a hospitalist during your hospital stay. If you have any questions about your discharge medications or the care you received while you were in the hospital after you are discharged, you can call the unit and ask to speak with the hospitalist or the covering physician. Once you are discharged, your primary care physician will handle any further medical issues. Please note that NO REFILLS for any discharge medications will be authorized once you are discharged, as it is imperative that you return to your primary care physician (or establish a relationship with a primary care physician if you do not have one).   Increase activity slowly   Complete by: As directed        Discharge Medications:   Allergies as of 02/03/2023       Reactions   Sulfa Antibiotics Hives, Itching, Rash, Swelling   Morphine Other (See Comments)   "Gives me crazy dreams" (delusions, also) Psychosis, also   Sulfonamide Derivatives Hives, Itching, Swelling        Medication List     STOP taking these medications    doxycycline 100 MG tablet Commonly known as: VIBRA-TABS    rivaroxaban 10 MG Tabs tablet Commonly known as: XARELTO       TAKE these medications    acetaminophen 500 MG tablet Commonly known as: TYLENOL Take 1,000 mg by mouth every 6 (six) hours as needed (for pain/sleep).   allopurinol 300 MG tablet Commonly known as: ZYLOPRIM Take 0.5 tablets (150 mg total) by mouth at bedtime. What changed: how much to take   amLODipine 5 MG tablet Commonly known as: NORVASC Take 5 mg by mouth See admin instructions. Take 5 mg by mouth once a day and hold for a low B/P  reading   apixaban 5 MG Tabs tablet Commonly known as: ELIQUIS Take 1 tablet (5 mg total) by mouth 2 (two) times daily.   cholestyramine light 4 g packet Commonly known as: PREVALITE Take 1 packet (4 g total) by mouth 3 (three) times daily after meals.   DULoxetine 60 MG capsule Commonly known as: CYMBALTA TAKE TWO CAPSULES (120 mg total) BY MOUTH DAILY AT 9AM What changed: See the new instructions.   folic acid 1 MG tablet Commonly known as: FOLVITE Take 2 tablets (2 mg total) by mouth daily. What changed: how much to take   Gas-X Extra Strength 125 MG Caps Generic drug: Simethicone Take 125 mg by mouth at bedtime.   loperamide 2 MG tablet Commonly known as: IMODIUM A-D Take 4 mg by mouth in the morning and at bedtime.   metoprolol succinate 100 MG 24 hr tablet Commonly known as: TOPROL-XL Take 100 mg by mouth at bedtime.   OVER THE COUNTER MEDICATION Take 1 tablet by mouth See admin instructions. Nervive Nerve Health tablet- Take 1 tablet by mouth at bedtime   TUMS EXTRA STRENGTH 750 PO Take 1-2 tablets by mouth every 6 (six) hours as needed (CHEW for heartburn).   Vitamin D (Ergocalciferol) 1.25 MG (50000 UNIT) Caps capsule Commonly known as: DRISDOL Take 50,000 Units by mouth every 30 (thirty) days.         The results of significant diagnostics from this hospitalization (including imaging, microbiology, ancillary and laboratory) are listed below for  reference.    Procedures and Diagnostic Studies:   DG Ribs Unilateral W/Chest Left  Result Date: 02/01/2023 CLINICAL DATA:  Fall.  Left chest wall pain EXAM: LEFT RIBS AND CHEST - 3 VIEW COMPARISON:  Chest x-ray 01/10/2023 and older FINDINGS: No consolidation, pneumothorax or effusion. No edema. Normal cardiopericardial silhouette. Overlapping cardiac leads. Mild eventration of the right hemidiaphragm. Degenerative changes of the shoulders. Degenerative changes along the visualized spine. No rib fracture seen. IMPRESSION: No rib fracture seen.  No pneumothorax. Electronically Signed   By: Karen Kays M.D.   On: 02/01/2023 11:44   CT HEAD WO CONTRAST ( )  Result Date: 02/01/2023 CLINICAL DATA:  Larey Seat with trauma to the head.  Anticoagulated. EXAM: CT HEAD WITHOUT CONTRAST TECHNIQUE: Contiguous axial images were obtained from the base of the skull through the vertex without intravenous contrast. RADIATION DOSE REDUCTION: This exam was performed according to the departmental dose-optimization program which includes automated exposure control, adjustment of the mA and/or kV according to patient size and/or use of iterative reconstruction technique. COMPARISON:  None Available. FINDINGS: Brain: The brain has normal appearance without evidence of atrophy, old or acute infarction, mass lesion, hemorrhage, hydrocephalus or extra-axial collection. Vascular: There is atherosclerotic calcification of the major vessels at the base of the brain. Skull: Normal Sinuses/Orbits: Clear/normal Other: Left frontal scalp hematoma. IMPRESSION: 1. Left frontal scalp hematoma. No underlying skull fracture. No intracranial injury. 2. Atherosclerotic calcification of the major vessels at the base of the brain. Electronically Signed   By: Paulina Fusi M.D.   On: 02/01/2023 11:34     Labs:   Basic Metabolic Panel: Recent Labs  Lab 02/01/23 1518 02/01/23 2033 02/02/23 0237 02/02/23 0916 02/02/23 1446 02/03/23 0842  NA  135 138 138 138 137 138  K 3.6 3.0* 3.1* 3.1* 3.2* 3.5  CL 111 111 109 111 109 107  CO2 12* 14* 13* 14* 16* 18*  GLUCOSE 111* 134* 123* 120* 123* 119*  BUN 47* 47* 49*  44* 46* 47*  CREATININE 3.42* 3.51* 3.39* 3.33* 3.22* 2.96*  CALCIUM 6.8* 6.8* 6.9* 7.0* 7.4* 7.1*  MG 0.7*  --   --  1.2*  --   --   PHOS 4.5  --   --   --   --   --    GFR Estimated Creatinine Clearance: 18.7 mL/min (A) (by C-G formula based on SCr of 2.96 mg/dL (H)). Liver Function Tests: Recent Labs  Lab 02/01/23 1518 02/01/23 2033 02/02/23 0237 02/02/23 0916 02/02/23 1446  AST 26 30 29  32 28  ALT 22 24 24 25 22   ALKPHOS 105 115 112 114 112  BILITOT 0.4 0.6 0.9 0.7 0.7  PROT 6.7 6.8 6.9 7.0 6.7  ALBUMIN 3.1* 3.2* 3.3* 3.3* 3.3*   No results for input(s): "LIPASE", "AMYLASE" in the last 168 hours. No results for input(s): "AMMONIA" in the last 168 hours. Coagulation profile No results for input(s): "INR", "PROTIME" in the last 168 hours.  CBC: Recent Labs  Lab 02/01/23 1056 02/02/23 0237 02/03/23 0842  WBC 12.5* 12.0* 8.1  NEUTROABS 9.0*  --   --   HGB 10.4* 9.9* 9.2*  HCT 32.0* 29.8* 28.6*  MCV 103.6* 100.7* 101.8*  PLT 410* 338 328   Cardiac Enzymes: No results for input(s): "CKTOTAL", "CKMB", "CKMBINDEX", "TROPONINI" in the last 168 hours. BNP: Invalid input(s): "POCBNP" CBG: No results for input(s): "GLUCAP" in the last 168 hours. D-Dimer No results for input(s): "DDIMER" in the last 72 hours. Hgb A1c No results for input(s): "HGBA1C" in the last 72 hours. Lipid Profile No results for input(s): "CHOL", "HDL", "LDLCALC", "TRIG", "CHOLHDL", "LDLDIRECT" in the last 72 hours. Thyroid function studies No results for input(s): "TSH", "T4TOTAL", "T3FREE", "THYROIDAB" in the last 72 hours.  Invalid input(s): "FREET3" Anemia work up No results for input(s): "VITAMINB12", "FOLATE", "FERRITIN", "TIBC", "IRON", "RETICCTPCT" in the last 72 hours. Microbiology No results found for this or any  previous visit (from the past 240 hour(s)).  Time coordinating discharge: 45 minutes  Signed: Dani Danis  [facility] 02/03/2023, 3:58 PM

## 2023-02-03 NOTE — Progress Notes (Signed)
Patient has been given discharge instructions and verbalizes understanding.

## 2023-02-03 NOTE — Plan of Care (Signed)

## 2023-02-03 NOTE — TOC CAGE-AID Note (Signed)
Transition of Care [facility]) - CAGE-AID Screening   Patient Details  Name: [name] MRN: [id number] Date of Birth: [birth year]  Transition of Care [facility]) CM/SW Contact:    Erin Sons, LCSW Phone Number: 02/03/2023, 9:55 AM   CAGE-AID Screening:    Have You Ever Felt You Ought to Cut Down on Your Drinking or Drug Use?: No Have People Annoyed You By [employer] Your Drinking Or Drug Use?: No Have You Felt Bad Or Guilty About Your Drinking Or Drug Use?: No Have You Ever Had a Drink or Used Drugs First Thing In The Morning to Steady Your Nerves or to Get Rid of a Hangover?: No CAGE-AID Score: 0  Substance Abuse Education Offered: No

## 2023-02-07 ENCOUNTER — Other Ambulatory Visit: Payer: Self-pay | Admitting: Family Medicine

## 2023-02-07 ENCOUNTER — Ambulatory Visit (INDEPENDENT_AMBULATORY_CARE_PROVIDER_SITE_OTHER): Payer: Medicare Other

## 2023-02-07 VITALS — BP 145/86 | HR 79 | Temp 97.9°F | Resp 16 | Ht 63.0 in | Wt 185.8 lb

## 2023-02-07 DIAGNOSIS — K50819 Crohn's disease of both small and large intestine with unspecified complications: Secondary | ICD-10-CM | POA: Diagnosis not present

## 2023-02-07 DIAGNOSIS — E860 Dehydration: Secondary | ICD-10-CM

## 2023-02-07 DIAGNOSIS — R198 Other specified symptoms and signs involving the digestive system and abdomen: Secondary | ICD-10-CM

## 2023-02-07 DIAGNOSIS — F322 Major depressive disorder, single episode, severe without psychotic features: Secondary | ICD-10-CM

## 2023-02-07 MED ORDER — OCTREOTIDE ACETATE 20 MG IM KIT
20.0000 mg | PACK | Freq: Once | INTRAMUSCULAR | Status: AC
  Administered 2023-02-07: 20 mg via INTRAMUSCULAR
  Filled 2023-02-07: qty 1

## 2023-02-07 MED ORDER — SODIUM CHLORIDE 0.9 % IV BOLUS
1000.0000 mL | Freq: Once | INTRAVENOUS | Status: AC
  Administered 2023-02-07: 1000 mL via INTRAVENOUS
  Filled 2023-02-07: qty 1000

## 2023-02-07 NOTE — Progress Notes (Signed)
Diagnosis:   Crohn's disease of small and large intestines with complication   Provider:  Chilton Greathouse MD  Procedure: Injection  octreotide, Dose: 20 mg, Site: intramuscular, Number of injections: 1  Post Care:  n/a  Discharge: Condition: Good, Destination: Home . AVS Declined  Performed by:  Loney Hering, LPN      Diagnosis: Dehydration  Provider:  Chilton Greathouse MD  Procedure: IV Infusion  IV Type: Peripheral, IV Location: L Antecubital  Normal Saline, Dose: 1000 ml  Infusion Start Time: 0828  Infusion Stop Time: 0936  Post Infusion IV Care:  n/a  Discharge: Condition: Good, Destination: Home . AVS Declined  Performed by:  Loney Hering, LPN

## 2023-02-08 ENCOUNTER — Other Ambulatory Visit: Payer: Self-pay | Admitting: Family

## 2023-02-08 ENCOUNTER — Encounter: Payer: Self-pay | Admitting: Nephrology

## 2023-02-08 DIAGNOSIS — D500 Iron deficiency anemia secondary to blood loss (chronic): Secondary | ICD-10-CM

## 2023-02-08 NOTE — Addendum Note (Signed)
Addended by: Marin Shutter E on: 02/08/2023 11:18 AM   Modules accepted: Orders

## 2023-02-09 ENCOUNTER — Encounter: Payer: Self-pay | Admitting: Family

## 2023-02-09 ENCOUNTER — Inpatient Hospital Stay (HOSPITAL_BASED_OUTPATIENT_CLINIC_OR_DEPARTMENT_OTHER): Payer: Medicare Other | Admitting: Family

## 2023-02-09 ENCOUNTER — Inpatient Hospital Stay: Payer: Medicare Other | Attending: Family

## 2023-02-09 ENCOUNTER — Ambulatory Visit (INDEPENDENT_AMBULATORY_CARE_PROVIDER_SITE_OTHER): Payer: Medicare Other

## 2023-02-09 ENCOUNTER — Other Ambulatory Visit: Payer: Self-pay

## 2023-02-09 VITALS — BP 100/73 | HR 97 | Resp 17 | Ht 63.0 in | Wt 189.0 lb

## 2023-02-09 VITALS — BP 125/80 | HR 85 | Temp 97.9°F | Resp 18 | Ht 63.0 in | Wt 185.8 lb

## 2023-02-09 DIAGNOSIS — I82612 Acute embolism and thrombosis of superficial veins of left upper extremity: Secondary | ICD-10-CM | POA: Insufficient documentation

## 2023-02-09 DIAGNOSIS — D500 Iron deficiency anemia secondary to blood loss (chronic): Secondary | ICD-10-CM | POA: Diagnosis not present

## 2023-02-09 DIAGNOSIS — I82622 Acute embolism and thrombosis of deep veins of left upper extremity: Secondary | ICD-10-CM

## 2023-02-09 DIAGNOSIS — Z7901 Long term (current) use of anticoagulants: Secondary | ICD-10-CM | POA: Insufficient documentation

## 2023-02-09 DIAGNOSIS — E860 Dehydration: Secondary | ICD-10-CM | POA: Diagnosis not present

## 2023-02-09 DIAGNOSIS — D6862 Lupus anticoagulant syndrome: Secondary | ICD-10-CM | POA: Insufficient documentation

## 2023-02-09 DIAGNOSIS — K51918 Ulcerative colitis, unspecified with other complication: Secondary | ICD-10-CM | POA: Diagnosis not present

## 2023-02-09 LAB — IRON AND IRON BINDING CAPACITY (CC-WL,HP ONLY)
Iron: 58 ug/dL (ref 28–170)
Saturation Ratios: 19 % (ref 10.4–31.8)
TIBC: 302 ug/dL (ref 250–450)
UIBC: 244 ug/dL (ref 148–442)

## 2023-02-09 LAB — CBC WITH DIFFERENTIAL (CANCER CENTER ONLY)
Abs Immature Granulocytes: 0.09 10*3/uL — ABNORMAL HIGH (ref 0.00–0.07)
Basophils Absolute: 0.1 10*3/uL (ref 0.0–0.1)
Basophils Relative: 1 %
Eosinophils Absolute: 0.3 10*3/uL (ref 0.0–0.5)
Eosinophils Relative: 3 %
HCT: 30.4 % — ABNORMAL LOW (ref 36.0–46.0)
Hemoglobin: 9.7 g/dL — ABNORMAL LOW (ref 12.0–15.0)
Immature Granulocytes: 1 %
Lymphocytes Relative: 21 %
Lymphs Abs: 2.8 10*3/uL (ref 0.7–4.0)
MCH: 33 pg (ref 26.0–34.0)
MCHC: 31.9 g/dL (ref 30.0–36.0)
MCV: 103.4 fL — ABNORMAL HIGH (ref 80.0–100.0)
Monocytes Absolute: 1 10*3/uL (ref 0.1–1.0)
Monocytes Relative: 7 %
Neutro Abs: 9 10*3/uL — ABNORMAL HIGH (ref 1.7–7.7)
Neutrophils Relative %: 67 %
Platelet Count: 321 10*3/uL (ref 150–400)
RBC: 2.94 MIL/uL — ABNORMAL LOW (ref 3.87–5.11)
RDW: 15.5 % (ref 11.5–15.5)
WBC Count: 13.2 10*3/uL — ABNORMAL HIGH (ref 4.0–10.5)
nRBC: 0 % (ref 0.0–0.2)

## 2023-02-09 LAB — FERRITIN: Ferritin: 137 ng/mL (ref 11–307)

## 2023-02-09 LAB — CMP (CANCER CENTER ONLY)
ALT: 18 U/L (ref 0–44)
AST: 23 U/L (ref 15–41)
Albumin: 3.8 g/dL (ref 3.5–5.0)
Alkaline Phosphatase: 119 U/L (ref 38–126)
Anion gap: 12 (ref 5–15)
BUN: 56 mg/dL — ABNORMAL HIGH (ref 8–23)
CO2: 17 mmol/L — ABNORMAL LOW (ref 22–32)
Calcium: 7.9 mg/dL — ABNORMAL LOW (ref 8.9–10.3)
Chloride: 110 mmol/L (ref 98–111)
Creatinine: 3.38 mg/dL — ABNORMAL HIGH (ref 0.44–1.00)
GFR, Estimated: 14 mL/min — ABNORMAL LOW (ref >60)
Glucose, Bld: 96 mg/dL (ref 70–99)
Potassium: 3.9 mmol/L (ref 3.5–5.1)
Sodium: 139 mmol/L (ref 135–145)
Total Bilirubin: 0.3 mg/dL (ref 0.3–1.2)
Total Protein: 6.9 g/dL (ref 6.5–8.1)

## 2023-02-09 MED ORDER — SODIUM CHLORIDE 0.9 % IV BOLUS
1000.0000 mL | Freq: Once | INTRAVENOUS | Status: AC
  Administered 2023-02-09: 1000 mL via INTRAVENOUS

## 2023-02-09 NOTE — Progress Notes (Signed)
Hematology and Oncology Follow Up Visit  Avari Berkner [id number] June 09, 1956 67 y.o. 02/09/2023   Principle Diagnosis:  Recurrent iron deficiency anemia secondary to ulcerative colitis Right cephalic/basilic vein thrombus - transiently positive lupus anticoagulant DVT left basilic vein diagnosed 01/27/2023   Current Therapy:        Eliquis 5 mg PO BID   Interim History:  Ms. Kozakiewicz is here today for follow-up. Unfortunately, she had a fall last week while at the infusion center after receiving IV fluids. She states that she passed out and hit the left side of her face and body. Thankfully she states she was not seriously injured. She has bruising to her left face and side that is healing. She has abrasions on her left arm and states that her left ribs are still sore.  No other bruising. No petechiae. No obvious blood loss noted.  She has switched to Eliquis PO BID instead of Xarelto due to history of kidney disease.  No fever, chills, n/v, cough, rash, dizziness, SOB, chest pain, palpitations, abdominal pain or changes in bowel or bladder habits.  No swelling, numbness or tingling in her extremities at this time.  Appetite and hydration are good. She is now going 3 times a week for IV fluids.  Ostomy is working appropriately.   ECOG Performance Status: 1 - Symptomatic but completely ambulatory  Medications:  Allergies as of 02/09/2023       Reactions   Sulfa Antibiotics Hives, Itching, Rash, Swelling   Morphine Other (See Comments)   "Gives me crazy dreams" (delusions, also) Psychosis, also   Sulfonamide Derivatives Hives, Itching, Swelling        Medication List        Accurate as of February 09, 2023  1:28 PM. If you have any questions, ask your nurse or doctor.          acetaminophen 500 MG tablet Commonly known as: TYLENOL Take 1,000 mg by mouth every 6 (six) hours as needed (for pain/sleep).   allopurinol 300 MG tablet Commonly known as: ZYLOPRIM Take 0.5 tablets (150  mg total) by mouth at bedtime. What changed: how much to take   amLODipine 5 MG tablet Commonly known as: NORVASC Take 5 mg by mouth See admin instructions. Take 5 mg by mouth once a day and hold for a low B/P reading   apixaban 5 MG Tabs tablet Commonly known as: ELIQUIS Take 1 tablet (5 mg total) by mouth 2 (two) times daily.   cholestyramine light 4 g packet Commonly known as: PREVALITE TAKE 1 PACKET (4 G TOTAL) BY MOUTH 3 (THREE) TIMES DAILY AFTER MEALS.   DULoxetine 60 MG capsule Commonly known as: CYMBALTA TAKE TWO CAPSULES (120MG  TOTAL) BY MOUTH DAILY AT 9AM   folic acid 1 MG tablet Commonly known as: FOLVITE Take 2 tablets (2 mg total) by mouth daily. What changed: how much to take   Gas-X Extra Strength 125 MG Caps Generic drug: Simethicone Take 125 mg by mouth at bedtime.   loperamide 2 MG tablet Commonly known as: IMODIUM A-D Take 4 mg by mouth in the morning and at bedtime.   metoprolol succinate 100 MG 24 hr tablet Commonly known as: TOPROL-XL Take 100 mg by mouth at bedtime.   OVER THE COUNTER MEDICATION Take 1 tablet by mouth See admin instructions. Nervive Nerve Health tablet- Take 1 tablet by mouth at bedtime   TUMS EXTRA STRENGTH 750 PO Take 1-2 tablets by mouth every 6 (six) hours as  needed (CHEW for heartburn).   Vitamin D (Ergocalciferol) 1.25 MG (50000 UNIT) Caps capsule Commonly known as: DRISDOL Take 50,000 Units by mouth every 30 (thirty) days.        Allergies:  Allergies  Allergen Reactions   Sulfa Antibiotics Hives, Itching, Rash and Swelling   Morphine Other (See Comments)    "Gives me crazy dreams" (delusions, also) Psychosis, also    Sulfonamide Derivatives Hives, Itching and Swelling    Past Medical History, Surgical history, Social history, and Family History were reviewed and updated.  Review of Systems: All other 10 point review of systems is negative.   Physical Exam:  height is 5\' 3"  (1.6 m) and weight is 189 lb  (85.7 kg). Her blood pressure is 100/73 and her pulse is 97. Her respiration is 17 and oxygen saturation is 100%.   Wt Readings from Last 3 Encounters:  02/09/23 189 lb (85.7 kg)  02/09/23 185 lb 12.8 oz (84.3 kg)  02/07/23 185 lb 12.8 oz (84.3 kg)    Ocular: Sclerae unicteric, pupils equal, round and reactive to light Ear-nose-throat: Oropharynx clear, dentition fair Lymphatic: No cervical or supraclavicular adenopathy Lungs no rales or rhonchi, good excursion bilaterally Heart regular rate and rhythm, no murmur appreciated Abd soft, nontender, positive bowel sounds MSK no focal spinal tenderness, no joint edema Neuro: non-focal, well-oriented, appropriate affect Breasts: Deferred   Lab Results  Component Value Date   WBC 13.2 (H) 02/09/2023   HGB 9.7 (L) 02/09/2023   HCT 30.4 (L) 02/09/2023   MCV 103.4 (H) 02/09/2023   PLT 321 02/09/2023   Lab Results  Component Value Date   FERRITIN 115 01/27/2023   IRON 66 01/27/2023   TIBC 414 01/27/2023   UIBC 348 01/27/2023   IRONPCTSAT 16 01/27/2023   Lab Results  Component Value Date   RETICCTPCT 3.4 (H) 01/27/2023   RBC 2.94 (L) 02/09/2023   No results found for: "KPAFRELGTCHN", "LAMBDASER", "KAPLAMBRATIO" No results found for: "IGGSERUM", "IGA", "IGMSERUM" No results found for: "TOTALPROTELP", "ALBUMINELP", "A1GS", "A2GS", "BETS", "BETA2SER", "GAMS", "MSPIKE", "SPEI"   Chemistry      Component Value Date/Time   NA 139 02/09/2023 1231   NA 142 09/08/2017 0923   K 3.9 02/09/2023 1231   K 3.7 09/08/2017 0923   CL 110 02/09/2023 1231   CL 97 (L) 09/08/2017 0923   CO2 17 (L) 02/09/2023 1231   CO2 28 09/08/2017 0923   BUN 56 (H) 02/09/2023 1231   BUN 49 (H) 09/08/2017 0923   CREATININE 3.38 (H) 02/09/2023 1231   CREATININE 3.1 (HH) 09/08/2017 0923      Component Value Date/Time   CALCIUM 7.9 (L) 02/09/2023 1231   CALCIUM 9.9 09/08/2017 0923   CALCIUM 8.9 04/12/2013 0851   ALKPHOS 119 02/09/2023 1231   ALKPHOS 89 (H)  09/08/2017 0923   AST 23 02/09/2023 1231   ALT 18 02/09/2023 1231   ALT 22 09/08/2017 0923   BILITOT 0.3 02/09/2023 1231       Impression and Plan:  Ms. August is a very pleasant 67 yo caucasian female with iron deficiency anemia secondary to intermittent GI blood loss/malabsorption with UC. 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  Iron studies are pending. We will replace if needed.  She is doing well on Eliquis for left basilic vein DVT.  Follow-up in 6 weeks. We will repeat US of her left arm at that time as well.    Eileen Stanford, NP 6/5/20241:28 PM

## 2023-02-09 NOTE — Progress Notes (Signed)
Diagnosis: Dehydration  Provider:  Chilton Greathouse MD  Procedure: IV Infusion  IV Type: Peripheral, IV Location: L Antecubital  Normal Saline, Dose: 1000 ml  Infusion Start Time: 0841  Infusion Stop Time: 0955  Post Infusion IV Care: Peripheral IV Discontinued  Discharge: Condition: Good, Destination: Home . AVS Declined  Performed by:  Adriana Mccallum, RN

## 2023-02-10 ENCOUNTER — Encounter: Payer: Self-pay | Admitting: Family Medicine

## 2023-02-10 ENCOUNTER — Ambulatory Visit: Payer: Medicare Other | Admitting: Medical Oncology

## 2023-02-10 ENCOUNTER — Inpatient Hospital Stay: Payer: Medicare Other

## 2023-02-10 ENCOUNTER — Ambulatory Visit (INDEPENDENT_AMBULATORY_CARE_PROVIDER_SITE_OTHER): Payer: Medicare Other | Admitting: Family Medicine

## 2023-02-10 ENCOUNTER — Telehealth (HOSPITAL_BASED_OUTPATIENT_CLINIC_OR_DEPARTMENT_OTHER): Payer: Self-pay

## 2023-02-10 VITALS — BP 84/47 | HR 82 | Temp 97.7°F | Resp 18 | Ht 63.0 in | Wt 188.0 lb

## 2023-02-10 DIAGNOSIS — D72829 Elevated white blood cell count, unspecified: Secondary | ICD-10-CM

## 2023-02-10 DIAGNOSIS — N184 Chronic kidney disease, stage 4 (severe): Secondary | ICD-10-CM | POA: Diagnosis not present

## 2023-02-10 DIAGNOSIS — R0781 Pleurodynia: Secondary | ICD-10-CM | POA: Diagnosis not present

## 2023-02-10 DIAGNOSIS — R319 Hematuria, unspecified: Secondary | ICD-10-CM | POA: Diagnosis not present

## 2023-02-10 DIAGNOSIS — E785 Hyperlipidemia, unspecified: Secondary | ICD-10-CM

## 2023-02-10 DIAGNOSIS — I952 Hypotension due to drugs: Secondary | ICD-10-CM | POA: Diagnosis not present

## 2023-02-10 NOTE — Assessment & Plan Note (Signed)
Per nephrology 

## 2023-02-10 NOTE — Assessment & Plan Note (Signed)
Stop amlodapine F/u 2 weeks

## 2023-02-10 NOTE — Progress Notes (Signed)
Subjective:   By signing my name below, I, [name], attest that this documentation has been prepared under the direction and in the presence of Jeanette Schultz, DO 02/10/23   Patient ID: [name], female    DOB: [birth year], 67 y.o.   MRN: [id number]  Chief Complaint  Patient presents with   Recent fall     She fell leaving the infusion center about 2 weeks ago. She thinks she broke a rib.  She has chest pain. She noticed blood in her urine this morning, and a large bruise on the R arm.     HPI Patient is in today for evaluation of labile blood pressures. She reports that her BP often drops to 80s/60s particularly at the infusion center. She reports a fall x2 weeks ago as she was leaving the infusion center. She has had some chest wall pain since that fall with accompanying bruising on her right arm. Her chest wall pain is worsened with deep breathing and coughing. Her pain is worsened with some movements causing her to have difficulty sleeping. She has a secondary complaint of hematuria this morning.  Past Medical History:  Diagnosis Date   Abdominal pain    Hx   Acute pyelonephritis 12/07/2020   Allergy    Anal stenosis    Anemia    Anxiety    Arthritis    Asthma    patient does not have inhaler   Blood in stool    Hx   Blood in urine    Hx   Blood transfusion without reported diagnosis    Cataract    CKD (chronic kidney disease) stage 3, GFR 30-59 ml/min (HCC)    Crohn's colitis, other complication (HCC)    De Quervain's tenosynovitis    Depression    Difficulty urinating    Hx   Easy bruising    Esophagitis    Fainting    History - resolved - due to dehydration   Fatigue    Hx   Fibroid    Hx   Gastric polyp    GERD (gastroesophageal reflux disease)    Hearing loss    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   Hemorrhoids, external    Hemorrhoids, internal    Herpes, genital    vaginal treated 07/05/12 and pt states is resolved   History of  cervical dysplasia    History of small bowel obstruction    Hyperlipidemia    currently no meds   Hyperparathyroidism    Hypertension    Hypokalemia    Hx   Hypotension 06/29/2022   IBD (inflammatory bowel disease)    initially colectomy for suspected UC, 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   Obesity    Ovarian cyst    Pain in joint, pelvic region and thigh 12/16/2008   Centricity Description: HIP PAIN, LEFT  Qualifier: Diagnosis of   By: Janit Bern      Centricity Description: HIP PAIN  Qualifier: Diagnosis of   By: Janit Bern       Pain of right thumb 04/12/2018   Poor dental hygiene    Pulmonary nodule, right    right upper lobe   Rectal bleeding    Hx   Rectal pain    Hx   Renal insufficiency    CKD - stage 3   RLS (restless legs syndrome)  no meds   Sepsis secondary to UTI (HCC) 06/29/2022   Severe sepsis (HCC) 12/07/2020   Tooth infection 11/2016   right low   Ulcerative colitis    Visual disturbance    wears glasses   Weakness generalized    Hx - patient denies generalized weakness   Wears dentures    upper only    Past Surgical History:  Procedure Laterality Date   ANAL DILATION     BIOPSY  07/20/2022   Procedure: BIOPSY;  Surgeon: Meryl Dare, MD;  Location: [facility];  Service: Gastroenterology;;   CERVICAL BIOPSY  W/ LOOP ELECTRODE EXCISION     CHOLECYSTECTOMY     COLONOSCOPY     [name]   fatty tumor removed from back     X 2   HEMORRHOID SURGERY     ILEOSCOPY N/A 07/20/2022   Procedure: ILEOSCOPY THROUGH STOMA;  Surgeon: Meryl Dare, MD;  Location: [facility];  Service: Gastroenterology;  Laterality: N/A;   ILEOSTOMY CLOSURE     RESTORATIVE PROCTOCOLECTOMY     with insertion of ileoanal J Pouch with loop ileostomy   SHOULDER ARTHROSCOPY WITH CAPSULORRHAPHY Left 06/14/2019   Procedure: LEFT SHOULDER ARTHRSCOPIC REPAIR OF BONY BANKART FRACTURE;  Surgeon: Jones Broom, MD;  Location: [facility];  Service: Orthopedics;  Laterality: Left;  REQUEST 90 MINUTE   SIGMOIDOSCOPY     TOTAL ABDOMINAL HYSTERECTOMY  1998   TAH/LSO   TUBAL LIGATION     UPPER GASTROINTESTINAL ENDOSCOPY     [name]    Family History  Problem Relation Age of Onset   Hypertension Mother    Heart disease Mother        s/p pci   Ulcerative colitis Father    Hypertension Father    Heart attack Father    Diabetes Sister    Cancer Sister        uterine   Pulmonary fibrosis Sister    Ulcerative colitis Daughter    Cancer Maternal Uncle        LUNG   Irritable bowel syndrome Other        grandchildren   Colon cancer Neg Hx    Esophageal cancer Neg Hx    Stomach cancer Neg Hx    Rectal cancer Neg Hx     Social History   Socioeconomic History   Marital status: Divorced    Spouse name: Not on file   Number of children: Not on file   Years of education: Not on file   Highest education level: Not on file  Occupational History   Not on file  Tobacco Use   Smoking status: Former    Packs/day: 1.00    Years: 4.00    Additional pack years: 0.00    Total pack years: 4.00    Types: Cigarettes    Quit date: 05/21/1979    Years since quitting: 43.7   Smokeless tobacco: Never  Vaping Use   Vaping Use: Never used  Substance and Sexual Activity   Alcohol use: No    Alcohol/week: 0.0 standard drinks of alcohol   Drug use: No   Sexual activity: Yes    Birth control/protection: Post-menopausal    Comment: 1st intercourse 67 yo-Fewer than 5 partners  Other Topics Concern   Not on file  Social History Narrative   Not on file   Social Determinants of Health   Financial Resource Strain: Low Risk  (05/26/2022)   Overall Financial Resource Strain (CARDIA)  Difficulty of Paying Living Expenses: Not hard at all  Food Insecurity: No Food Insecurity (02/01/2023)   Hunger Vital Sign    Worried About Running Out of Food in the Last Year: Never true    Ran Out of Food in the  Last Year: Never true  Transportation Needs: No Transportation Needs (02/01/2023)   PRAPARE - Administrator, Civil Service (Medical): No    Lack of Transportation (Non-Medical): No  Physical Activity: Inactive (05/26/2022)   Exercise Vital Sign    Days of Exercise per Week: 0 days    Minutes of Exercise per Session: 0 min  Stress: No Stress Concern Present (05/26/2022)   [employer] of Occupational Health - Occupational Stress Questionnaire    Feeling of Stress : Not at all  Social Connections: Moderately Isolated (05/26/2022)   Social Connection and Isolation Panel [NHANES]    Frequency of Communication with Friends and Family: More than three times a week    Frequency of Social Gatherings with Friends and Family: More than three times a week    Attends Religious Services: Never    Database administrator or Organizations: No    Attends Banker Meetings: Never    Marital Status: Married  Catering manager Violence: Not At Risk (02/01/2023)   Humiliation, Afraid, Rape, and Kick questionnaire    Fear of Current or Ex-Partner: No    Emotionally Abused: No    Physically Abused: No    Sexually Abused: No    Outpatient Medications Prior to Visit  Medication Sig Dispense Refill   acetaminophen (TYLENOL) 500 MG tablet Take 1,000 mg by mouth every 6 (six) hours as needed (for pain/sleep).     allopurinol (ZYLOPRIM) 300 MG tablet Take 0.5 tablets (150 mg total) by mouth at bedtime. (Patient taking differently: Take 300 mg by mouth at bedtime.) 90 tablet 1   apixaban (ELIQUIS) 5 MG TABS tablet Take 1 tablet (5 mg total) by mouth 2 (two) times daily. 180 tablet 0   Calcium Carbonate Antacid (TUMS EXTRA STRENGTH 750 PO) Take 1-2 tablets by mouth every 6 (six) hours as needed (CHEW for heartburn).     cholestyramine light (PREVALITE) 4 g packet TAKE 1 PACKET (4 G TOTAL) BY MOUTH 3 (THREE) TIMES DAILY AFTER MEALS. 42 packet 1   DULoxetine (CYMBALTA) 60 MG capsule TAKE TWO  CAPSULES (120MG  TOTAL) BY MOUTH DAILY AT 9AM 30 capsule 11   folic acid (FOLVITE) 1 MG tablet Take 2 tablets (2 mg total) by mouth daily. (Patient taking differently: Take 1 mg by mouth daily.) 180 tablet 3   GAS-X EXTRA STRENGTH 125 MG CAPS Take 125 mg by mouth at bedtime.     loperamide (IMODIUM A-D) 2 MG tablet Take 4 mg by mouth in the morning and at bedtime.     metoprolol succinate (TOPROL-XL) 100 MG 24 hr tablet Take 100 mg by mouth at bedtime.     OVER THE COUNTER MEDICATION Take 1 tablet by mouth See admin instructions. Nervive Nerve Health tablet- Take 1 tablet by mouth at bedtime     Vitamin D, Ergocalciferol, (DRISDOL) 1.25 MG (50000 UNIT) CAPS capsule Take 50,000 Units by mouth every 30 (thirty) days.     amLODipine (NORVASC) 5 MG tablet Take 5 mg by mouth See admin instructions. Take 5 mg by mouth once a day and hold for a low B/P reading     No facility-administered medications prior to visit.    Allergies  Allergen  Reactions   Sulfa Antibiotics Hives, Itching, Rash and Swelling   Morphine Other (See Comments)    "Gives me crazy dreams" (delusions, also) Psychosis, also    Sulfonamide Derivatives Hives, Itching and Swelling    Review of Systems  Constitutional:  Negative for fever.  HENT:  Negative for congestion, sinus pain and sore throat.   Respiratory:  Negative for cough, shortness of breath and wheezing.   Cardiovascular:  Positive for chest pain. Negative for palpitations.  Gastrointestinal:  Negative for abdominal pain, constipation, diarrhea, nausea and vomiting.  Genitourinary:  Positive for hematuria. Negative for dysuria and frequency.  Musculoskeletal:  Positive for myalgias (left-sided chest wall).  Neurological:  Negative for headaches.       Objective:    Physical Exam Constitutional:      General: She is not in acute distress.    Appearance: Normal appearance. She is not ill-appearing.  HENT:     Head: Normocephalic and atraumatic.     Right  Ear: External ear normal.     Left Ear: External ear normal.  Eyes:     Extraocular Movements: Extraocular movements intact.     Pupils: Pupils are equal, round, and reactive to light.  Cardiovascular:     Rate and Rhythm: Normal rate and regular rhythm.     Heart sounds: Normal heart sounds. No murmur heard.    No gallop.  Pulmonary:     Effort: Pulmonary effort is normal. No respiratory distress.     Breath sounds: Normal breath sounds. No wheezing or rales.  Skin:    General: Skin is warm and dry.  Neurological:     Mental Status: She is alert and oriented to person, place, and time.  Psychiatric:        Judgment: Judgment normal.     BP (!) 84/47 (BP Location: Left Arm, Patient Position: Sitting, Cuff Size: Large)   Pulse 82   Temp 97.7 F (36.5 C) (Oral)   Resp 18   Ht 5\' 3"  (1.6 m)   Wt 188 lb (85.3 kg)   SpO2 100%   BMI 33.30 kg/m  Wt Readings from Last 3 Encounters:  02/10/23 188 lb (85.3 kg)  02/09/23 189 lb (85.7 kg)  02/09/23 185 lb 12.8 oz (84.3 kg)       Assessment & Plan:  Leukocytosis, unspecified type -     POCT Urinalysis Dipstick (Automated) -     CBC with Differential/Platelet  Hematuria, unspecified type -     POCT Urinalysis Dipstick (Automated) -     CBC with Differential/Platelet  Rib pain -     MR STERNUM WO CONTRAST; Future  Hypotension due to drugs Assessment & Plan: Stop amlodapine F/u 2 weeks    Hyperlipidemia, unspecified hyperlipidemia type Assessment & Plan: Encourage heart healthy diet such as MIND or DASH diet, increase exercise, avoid trans fats, simple carbohydrates and processed foods, consider a krill or fish or flaxseed oil cap daily.     CKD (chronic kidney disease) stage 4, GFR 15-29 ml/min [facility]) Assessment & Plan: Per nephrology      I,Alexis Herring,acting as a scribe for Jeanette Schultz, DO.,have documented all relevant documentation on the behalf of Jeanette Schultz, DO,as directed by  Jeanette Schultz, DO while in the presence of Jeanette Schultz, DO.   I, Jeanette Schultz, DO, personally preformed the services described in this documentation.  All medical record entries made by the scribe were  at my direction and in my presence.  I have reviewed the chart and discharge instructions (if applicable) and agree that the record reflects my personal performance and is accurate and complete. 02/10/23   Jeanette Schultz, DO

## 2023-02-10 NOTE — Assessment & Plan Note (Signed)
Encourage heart healthy diet such as MIND or DASH diet, increase exercise, avoid trans fats, simple carbohydrates and processed foods, consider a krill or fish or flaxseed oil cap daily.  °

## 2023-02-11 ENCOUNTER — Ambulatory Visit (INDEPENDENT_AMBULATORY_CARE_PROVIDER_SITE_OTHER): Payer: Medicare Other

## 2023-02-11 VITALS — BP 150/96 | HR 86 | Temp 97.3°F | Resp 20 | Ht 63.0 in | Wt 186.0 lb

## 2023-02-11 DIAGNOSIS — E860 Dehydration: Secondary | ICD-10-CM

## 2023-02-11 LAB — CBC WITH DIFFERENTIAL/PLATELET
Basophils Absolute: 0.1 10*3/uL (ref 0.0–0.1)
Basophils Relative: 0.6 % (ref 0.0–3.0)
Eosinophils Absolute: 0.3 10*3/uL (ref 0.0–0.7)
Eosinophils Relative: 2.1 % (ref 0.0–5.0)
HCT: 32.3 % — ABNORMAL LOW (ref 36.0–46.0)
Hemoglobin: 10.3 g/dL — ABNORMAL LOW (ref 12.0–15.0)
Lymphocytes Relative: 20.8 % (ref 12.0–46.0)
Lymphs Abs: 3.3 10*3/uL (ref 0.7–4.0)
MCHC: 32 g/dL (ref 30.0–36.0)
MCV: 105.3 fl — ABNORMAL HIGH (ref 78.0–100.0)
Monocytes Absolute: 1.1 10*3/uL — ABNORMAL HIGH (ref 0.1–1.0)
Monocytes Relative: 7 % (ref 3.0–12.0)
Neutro Abs: 11 10*3/uL — ABNORMAL HIGH (ref 1.4–7.7)
Neutrophils Relative %: 69.5 % (ref 43.0–77.0)
Platelets: 373 10*3/uL (ref 150.0–400.0)
RBC: 3.06 Mil/uL — ABNORMAL LOW (ref 3.87–5.11)
RDW: 16.3 % — ABNORMAL HIGH (ref 11.5–15.5)
WBC: 15.8 10*3/uL — ABNORMAL HIGH (ref 4.0–10.5)

## 2023-02-11 MED ORDER — SODIUM CHLORIDE 0.9 % IV BOLUS
1000.0000 mL | Freq: Once | INTRAVENOUS | Status: AC
  Administered 2023-02-11: 1000 mL via INTRAVENOUS

## 2023-02-11 NOTE — Progress Notes (Signed)
Diagnosis: Dehydration  Provider:  Chilton Greathouse MD  Procedure: IV Infusion  IV Type: Peripheral, IV Location: L Antecubital  Normal Saline, Dose: 1000 ml  Infusion Start Time: 0818  Infusion Stop Time: 0930  Post Infusion IV Care: Peripheral IV Discontinued  Discharge: Condition: Good, Destination: Home . AVS Declined  Performed by:  Garnette Czech, RN

## 2023-02-14 ENCOUNTER — Ambulatory Visit: Payer: Medicare Other

## 2023-02-14 MED ORDER — SODIUM CHLORIDE 0.9 % IV BOLUS: 1000.0000 mL | Freq: Once | INTRAVENOUS | Status: DC

## 2023-02-15 ENCOUNTER — Other Ambulatory Visit: Payer: Self-pay

## 2023-02-15 ENCOUNTER — Ambulatory Visit (HOSPITAL_COMMUNITY)
Admission: RE | Admit: 2023-02-15 | Discharge: 2023-02-15 | Disposition: A | Discharge status: Home / Self Care | Payer: Medicare Other | Source: Ambulatory Visit | Attending: Internal Medicine | Admitting: Internal Medicine

## 2023-02-15 ENCOUNTER — Telehealth: Payer: Self-pay

## 2023-02-15 ENCOUNTER — Ambulatory Visit (HOSPITAL_COMMUNITY)
Admission: RE | Admit: 2023-02-15 | Discharge: 2023-02-15 | Disposition: A | Discharge status: Home / Self Care | Payer: Medicare Other | Source: Ambulatory Visit | Attending: Family Medicine | Admitting: Family Medicine

## 2023-02-15 ENCOUNTER — Encounter: Payer: Self-pay | Admitting: Gastroenterology

## 2023-02-15 ENCOUNTER — Encounter: Payer: Self-pay | Admitting: Nephrology

## 2023-02-15 ENCOUNTER — Encounter: Payer: Self-pay | Admitting: Family

## 2023-02-15 ENCOUNTER — Telehealth: Payer: Self-pay | Admitting: *Deleted

## 2023-02-15 DIAGNOSIS — R0781 Pleurodynia: Secondary | ICD-10-CM | POA: Diagnosis not present

## 2023-02-15 DIAGNOSIS — S2242XA Multiple fractures of ribs, left side, initial encounter for closed fracture: Secondary | ICD-10-CM | POA: Diagnosis not present

## 2023-02-15 NOTE — Progress Notes (Signed)
  Care Coordination Note  02/15/2023 Name: [name] MRN: [id number] DOB: 1956/07/14  [name] is a 67 y.o. year old female who is a primary care patient of [name], DO and is actively engaged with the care management team. I reached out to [name] by phone today to assist with re-scheduling a follow up visit with the RN Case Manager  Follow up plan: Unsuccessful telephone outreach attempt made. A HIPAA compliant phone message was left for the patient providing contact information and requesting a return call.   Burman Nieves, CCMA Care Coordination Care Guide Direct Dial: [phone]

## 2023-02-15 NOTE — Progress Notes (Signed)
Per Dr Charise Killian, Pt does not need a MRI Sternum w/o she needs a CT Chest w/o contrast  Spoke with MD's office - She is trying to get new order and new Pre AUth

## 2023-02-15 NOTE — Telephone Encounter (Signed)
Patient is scheduled for MRI of the sternum today. Received a call from [facility] radiology, per radiologist patient needs a Non contrast CT of the chest instead of MRI.  They are requesting a new order for this so patient can still have the test today at 3 pm.  This is for rib pain patient was seen by Dr. Laury Axon 02/10/23.

## 2023-02-15 NOTE — Telephone Encounter (Signed)
CT ordered, per gwen no authorization required. Radiology at [facility] advised order was entered.

## 2023-02-15 NOTE — Telephone Encounter (Signed)
Okay, change order for noncontrast CT of the sternum. Proceed with a MRI if needed after the CT.

## 2023-02-16 ENCOUNTER — Ambulatory Visit (INDEPENDENT_AMBULATORY_CARE_PROVIDER_SITE_OTHER): Payer: Medicare Other

## 2023-02-16 ENCOUNTER — Telehealth: Payer: Self-pay | Admitting: Family Medicine

## 2023-02-16 VITALS — BP 137/94 | HR 97 | Temp 99.0°F | Resp 20 | Wt 188.2 lb

## 2023-02-16 DIAGNOSIS — E860 Dehydration: Secondary | ICD-10-CM | POA: Diagnosis not present

## 2023-02-16 DIAGNOSIS — F322 Major depressive disorder, single episode, severe without psychotic features: Secondary | ICD-10-CM

## 2023-02-16 MED ORDER — DULOXETINE HCL 60 MG PO CPEP
120.0000 mg | ORAL_CAPSULE | Freq: Every day | ORAL | 11 refills | Status: DC
Start: 2023-02-16 — End: 2024-02-01

## 2023-02-16 MED ORDER — SODIUM CHLORIDE 0.9 % IV BOLUS
1000.0000 mL | Freq: Once | INTRAVENOUS | Status: AC
  Administered 2023-02-16: 1000 mL via INTRAVENOUS

## 2023-02-16 NOTE — Progress Notes (Signed)
Diagnosis: Dehydration  Provider:  Chilton Greathouse MD  Procedure: IV Infusion  IV Type: Peripheral, IV Location: L Antecubital  Normal Saline, Dose: 1000 ml  Infusion Start Time: 0820  Infusion Stop Time: 0927  Post Infusion IV Care: Peripheral IV Discontinued  Discharge: Condition: Good, Destination: Home . AVS Declined  Performed by:  Adriana Mccallum, RN

## 2023-02-16 NOTE — Telephone Encounter (Signed)
[name] ([facility]) called stating that pt is wanting to have the pillpacks for her duloxetine and in order to do that, the script would have to be changed to a 30 day supply instead of a 15 day supply.

## 2023-02-16 NOTE — Addendum Note (Signed)
Addended byConrad Corrigan D on: 02/16/2023 12:23 PM   Modules accepted: Orders

## 2023-02-16 NOTE — Telephone Encounter (Signed)
Rx quantity corrected and resent.

## 2023-02-18 ENCOUNTER — Ambulatory Visit (INDEPENDENT_AMBULATORY_CARE_PROVIDER_SITE_OTHER): Payer: Medicare Other

## 2023-02-18 ENCOUNTER — Telehealth (HOSPITAL_BASED_OUTPATIENT_CLINIC_OR_DEPARTMENT_OTHER): Payer: Self-pay

## 2023-02-18 VITALS — BP 139/75 | HR 82 | Temp 98.2°F | Resp 18 | Ht 63.0 in | Wt 190.2 lb

## 2023-02-18 DIAGNOSIS — E860 Dehydration: Secondary | ICD-10-CM

## 2023-02-18 MED ORDER — SODIUM CHLORIDE 0.9 % IV BOLUS
1000.0000 mL | Freq: Once | INTRAVENOUS | Status: AC
  Administered 2023-02-18: 1000 mL via INTRAVENOUS
  Filled 2023-02-18: qty 1000

## 2023-02-18 NOTE — Progress Notes (Signed)
Diagnosis: Dehydration  Provider:  Chilton Greathouse MD  Procedure: IV Infusion  IV Type: Peripheral, IV Location: R Forearm  Normal Saline, Dose: 1000 ml  Infusion Start Time: 0819  Infusion Stop Time: 0928  Post Infusion IV Care: Peripheral IV Discontinued  Discharge: Condition: Good, Destination: Home . AVS Declined  Performed by:  Adriana Mccallum, RN

## 2023-02-21 ENCOUNTER — Ambulatory Visit (INDEPENDENT_AMBULATORY_CARE_PROVIDER_SITE_OTHER): Payer: Medicare Other | Admitting: *Deleted

## 2023-02-21 VITALS — BP 143/85 | HR 98 | Temp 97.6°F | Resp 16 | Ht 63.0 in | Wt 188.4 lb

## 2023-02-21 DIAGNOSIS — E860 Dehydration: Secondary | ICD-10-CM

## 2023-02-21 MED ORDER — SODIUM CHLORIDE 0.9 % IV BOLUS
1000.0000 mL | Freq: Once | INTRAVENOUS | Status: AC
  Administered 2023-02-21: 1000 mL via INTRAVENOUS
  Filled 2023-02-21: qty 1000

## 2023-02-21 NOTE — Progress Notes (Signed)
Diagnosis: Dehydration  Provider:  Chilton Greathouse MD  Procedure: IV Infusion  IV Type: Peripheral, IV Location: L Forearm  Normal Saline, Dose: 1000 ml  Infusion Start Time: 0824 am  Infusion Stop Time: 0940 am  Post Infusion IV Care: Observation period completed and Peripheral IV Discontinued  Discharge: Condition: Good, Destination: Home . AVS Declined  Performed by:  Forrest Moron, RN

## 2023-02-23 ENCOUNTER — Ambulatory Visit (INDEPENDENT_AMBULATORY_CARE_PROVIDER_SITE_OTHER): Payer: Medicare Other

## 2023-02-23 VITALS — BP 144/81 | HR 88 | Temp 98.2°F | Resp 16 | Ht 63.0 in | Wt 188.0 lb

## 2023-02-23 DIAGNOSIS — E860 Dehydration: Secondary | ICD-10-CM | POA: Diagnosis not present

## 2023-02-23 MED ORDER — SODIUM CHLORIDE 0.9 % IV BOLUS
1000.0000 mL | Freq: Once | INTRAVENOUS | Status: AC
  Administered 2023-02-23: 1000 mL via INTRAVENOUS

## 2023-02-23 NOTE — Progress Notes (Signed)
Diagnosis:   Crohn's disease of small and large intestines with complication   Provider:  Chilton Greathouse MD  Procedure: Injection  octreotide, Dose: 20 mg, Site: intramuscular, Number of injections: 1  Post Care:  n/a  Discharge: Condition: Good, Destination: Home . AVS Declined  Performed by:  Marin Shutter, RN      Diagnosis: Dehydration  Provider:  Chilton Greathouse MD  Procedure: IV Infusion  IV Type: Peripheral, IV Location: L Antecubital  Normal Saline, Dose: 1000 ml  Infusion Start Time: 0828  Infusion Stop Time: 0936  Post Infusion IV Care:  n/a  Discharge: Condition: Good, Destination: Home . AVS Declined  Performed by:  Marin Shutter, RN

## 2023-02-23 NOTE — Progress Notes (Signed)
Diagnosis: Dehydration  Provider:  Chilton Greathouse MD  Procedure: IV Infusion  IV Type: Peripheral, IV Location: L Antecubital  Normal Saline, Dose: 1 L  Infusion Start Time: 0822  Infusion Stop Time: 0941  Post Infusion IV Care: Patient declined observation and Peripheral IV Discontinued  Discharge: Condition: Stable, Destination: Home . AVS Declined  Performed by:  Wyvonne Lenz, RN

## 2023-02-25 ENCOUNTER — Ambulatory Visit (INDEPENDENT_AMBULATORY_CARE_PROVIDER_SITE_OTHER): Payer: Medicare Other

## 2023-02-25 VITALS — BP 161/103 | HR 91 | Temp 97.6°F | Resp 18 | Ht 63.0 in | Wt 198.6 lb

## 2023-02-25 DIAGNOSIS — E860 Dehydration: Secondary | ICD-10-CM

## 2023-02-25 MED ORDER — SODIUM CHLORIDE 0.9 % IV BOLUS
1000.0000 mL | Freq: Once | INTRAVENOUS | Status: AC
  Administered 2023-02-25: 1000 mL via INTRAVENOUS
  Filled 2023-02-25: qty 1000

## 2023-02-25 NOTE — Progress Notes (Signed)
Diagnosis: Dehydration  Provider:  Chilton Greathouse MD  Procedure: IV Infusion  IV Type: Peripheral, IV Location: R Antecubital  Normal Saline, Dose: 1000 ml  Infusion Start Time: 0848  Infusion Stop Time: 0951  Post Infusion IV Care: Peripheral IV Discontinued  Discharge: Condition: Good, Destination: Home . AVS Declined  Performed by:  Marlow Baars [place], RN

## 2023-02-25 NOTE — Progress Notes (Signed)
  Care Coordination Note  02/25/2023 Name: [name] MRN: [id number] DOB: 1956/07/15  [name] [name] is a 67 y.o. year old female who is a primary care patient of Mannam, Colbert Coyer, MD and is actively engaged with the care management team. I reached out to [name] by phone today to assist with re-scheduling a follow up visit with the RN Case Manager  Follow up plan: We have been unable to make contact with the patient for follow up. The care management team is available to follow up with the patient after provider conversation with the patient regarding recommendation for care management engagement and subsequent re-referral to the care management team.   Burman Nieves, [facility] Care Coordination Care Guide Direct Dial: [phone]

## 2023-02-28 ENCOUNTER — Ambulatory Visit (INDEPENDENT_AMBULATORY_CARE_PROVIDER_SITE_OTHER): Payer: Medicare Other

## 2023-02-28 VITALS — BP 151/94 | HR 91 | Temp 97.9°F | Resp 16 | Ht 63.0 in | Wt 190.0 lb

## 2023-02-28 DIAGNOSIS — E860 Dehydration: Secondary | ICD-10-CM | POA: Diagnosis not present

## 2023-02-28 MED ORDER — SODIUM CHLORIDE 0.9 % IV BOLUS
1000.0000 mL | Freq: Once | INTRAVENOUS | Status: AC
  Administered 2023-02-28: 1000 mL via INTRAVENOUS

## 2023-02-28 NOTE — Progress Notes (Signed)
Diagnosis: Dehydration  Provider:  Chilton Greathouse MD  Procedure: IV Infusion  IV Type: Peripheral, IV Location: R Forearm  Normal Saline, Dose: 1000 ml  Infusion Start Time: 0821  Infusion Stop Time: 0930  Post Infusion IV Care: Peripheral IV Discontinued  Discharge: Condition: Good, Destination: Home . AVS Declined  Performed by:  Loney Hering, LPN

## 2023-03-01 DIAGNOSIS — L24B3 Irritant contact dermatitis related to fecal or urinary stoma or fistula: Secondary | ICD-10-CM | POA: Diagnosis not present

## 2023-03-01 DIAGNOSIS — Z932 Ileostomy status: Secondary | ICD-10-CM | POA: Diagnosis not present

## 2023-03-02 ENCOUNTER — Telehealth: Payer: Self-pay | Admitting: *Deleted

## 2023-03-02 ENCOUNTER — Ambulatory Visit (INDEPENDENT_AMBULATORY_CARE_PROVIDER_SITE_OTHER): Payer: Medicare Other

## 2023-03-02 VITALS — BP 140/72 | HR 112 | Temp 97.8°F | Resp 16 | Ht 63.0 in | Wt 189.4 lb

## 2023-03-02 DIAGNOSIS — E860 Dehydration: Secondary | ICD-10-CM | POA: Diagnosis not present

## 2023-03-02 MED ORDER — SODIUM CHLORIDE 0.9 % IV BOLUS
1000.0000 mL | Freq: Once | INTRAVENOUS | Status: AC
  Administered 2023-03-02: 1000 mL via INTRAVENOUS

## 2023-03-02 NOTE — Progress Notes (Signed)
Diagnosis: Dehydration  Provider:  Chilton Greathouse MD  Procedure: IV Infusion  IV Type: Peripheral, IV Location: L Forearm  Normal Saline, Dose: 1000 ml  Infusion Start Time: 0815  Infusion Stop Time: 0920  Post Infusion IV Care: Peripheral IV Discontinued  Discharge: Condition: Good, Destination: Home . AVS Declined  Performed by:  Nat Math, RN

## 2023-03-02 NOTE — Progress Notes (Signed)
  Care Coordination Note  03/02/2023 Name: [name] MRN: [id number] DOB: 10/29/1955  [name] is a 67 y.o. year old female who is a primary care patient of Mannam, Colbert Coyer, MD and is actively engaged with the care management team. I reached out to [name] by phone today to assist with re-scheduling a follow up visit with the RN Case Manager  Follow up plan: Telephone appointment with care management team member scheduled for: 03/17/2023  Burman Nieves, [facility] Care Coordination Care Guide Direct Dial: [phone]

## 2023-03-04 ENCOUNTER — Ambulatory Visit (INDEPENDENT_AMBULATORY_CARE_PROVIDER_SITE_OTHER): Payer: Medicare Other

## 2023-03-04 VITALS — BP 138/83 | HR 86 | Temp 98.0°F | Resp 20 | Ht 63.0 in | Wt 190.4 lb

## 2023-03-04 DIAGNOSIS — E860 Dehydration: Secondary | ICD-10-CM | POA: Diagnosis not present

## 2023-03-04 MED ORDER — SODIUM CHLORIDE 0.9 % IV BOLUS
1000.0000 mL | Freq: Once | INTRAVENOUS | Status: AC
  Administered 2023-03-04: 1000 mL via INTRAVENOUS

## 2023-03-04 NOTE — Progress Notes (Signed)
Diagnosis: Dehydration  Provider:  Chilton Greathouse MD  Procedure: IV Infusion  IV Type: Peripheral, IV Location: L Antecubital  Normal Saline, Dose: 1000 ml  Infusion Start Time: 0825  Infusion Stop Time: 0930  Post Infusion IV Care: Patient declined observation and Peripheral IV Discontinued  Discharge: Condition: Stable, Destination: Home . AVS Declined  Performed by:  Wyvonne Lenz, RN

## 2023-03-07 ENCOUNTER — Ambulatory Visit (INDEPENDENT_AMBULATORY_CARE_PROVIDER_SITE_OTHER): Payer: Medicare Other

## 2023-03-07 VITALS — BP 149/92 | HR 87 | Temp 98.0°F | Resp 16 | Ht 63.0 in | Wt 189.2 lb

## 2023-03-07 DIAGNOSIS — Z932 Ileostomy status: Secondary | ICD-10-CM

## 2023-03-07 DIAGNOSIS — K50819 Crohn's disease of both small and large intestine with unspecified complications: Secondary | ICD-10-CM | POA: Diagnosis not present

## 2023-03-07 DIAGNOSIS — R198 Other specified symptoms and signs involving the digestive system and abdomen: Secondary | ICD-10-CM

## 2023-03-07 DIAGNOSIS — E860 Dehydration: Secondary | ICD-10-CM

## 2023-03-07 MED ORDER — SODIUM CHLORIDE 0.9 % IV BOLUS
1000.0000 mL | Freq: Once | INTRAVENOUS | Status: AC
  Administered 2023-03-07: 1000 mL via INTRAVENOUS

## 2023-03-07 MED ORDER — OCTREOTIDE ACETATE 20 MG IM KIT
20.0000 mg | PACK | Freq: Once | INTRAMUSCULAR | Status: AC
  Administered 2023-03-07: 20 mg via INTRAMUSCULAR
  Filled 2023-03-07: qty 1

## 2023-03-07 NOTE — Progress Notes (Signed)
Diagnosis: Dehydration  Provider:  Chilton Greathouse MD  Procedure: IV Infusion  IV Type: Peripheral, IV Location: L WRIST  Normal Saline, Dose: 1000 ml  Infusion Start Time: 0837  Infusion Stop Time: 0943  Post Infusion IV Care: Peripheral IV Discontinued  Discharge: Condition: Good, Destination: Home . AVS Declined  Performed by:  Loney Hering, LPN   Diagnosis: Crohn's Disease  Provider:  Chilton Greathouse MD  Procedure: Injection  OCTREOTIDE, Dose: 20 MG , Site: intramuscular, Number of injections: 1  Post Care: Observation period completed  Discharge: Condition: Good, Destination: Home . AVS Declined  Performed by:  Loney Hering, LPN

## 2023-03-09 ENCOUNTER — Ambulatory Visit (INDEPENDENT_AMBULATORY_CARE_PROVIDER_SITE_OTHER): Payer: Medicare Other

## 2023-03-09 VITALS — BP 154/89 | HR 93 | Temp 98.1°F | Resp 16 | Ht 63.0 in | Wt 190.0 lb

## 2023-03-09 DIAGNOSIS — E860 Dehydration: Secondary | ICD-10-CM | POA: Diagnosis not present

## 2023-03-09 MED ORDER — SODIUM CHLORIDE 0.9 % IV BOLUS
1000.0000 mL | Freq: Once | INTRAVENOUS | Status: AC
  Administered 2023-03-09: 1000 mL via INTRAVENOUS
  Filled 2023-03-09: qty 1000

## 2023-03-09 NOTE — Progress Notes (Signed)
Diagnosis: Dehydration  Provider:  Chilton Greathouse MD  Procedure: Injection  IV Type: Peripheral, IV Location: Lwrist  Normal Saline, Dose: 1000 ml  Infusion Start Time: 0826  Infusion Stop Time: 0930  Post Infusion IV Care: Peripheral IV Discontinued  Discharge: Condition: Good, Destination: Home . AVS Declined  Performed by:  Loney Hering, LPN

## 2023-03-14 ENCOUNTER — Ambulatory Visit (INDEPENDENT_AMBULATORY_CARE_PROVIDER_SITE_OTHER): Payer: Medicare Other

## 2023-03-14 VITALS — BP 133/83 | HR 85 | Temp 97.7°F | Resp 12 | Ht 63.0 in | Wt 188.8 lb

## 2023-03-14 DIAGNOSIS — E860 Dehydration: Secondary | ICD-10-CM | POA: Diagnosis not present

## 2023-03-14 MED ORDER — SODIUM CHLORIDE 0.9 % IV BOLUS
1000.0000 mL | Freq: Once | INTRAVENOUS | Status: AC
  Administered 2023-03-14: 1000 mL via INTRAVENOUS

## 2023-03-14 NOTE — Progress Notes (Signed)
Diagnosis: Dehydration  Provider:  Chilton Greathouse MD  Procedure: IV Infusion  IV Type: Peripheral, IV Location: L Forearm  Normal Saline, Dose: 1000 ml  Infusion Start Time: 0929  Infusion Stop Time: 1034  Post Infusion IV Care: Patient declined observation and Peripheral IV Discontinued  Discharge: Condition: Stable, Destination: Home . AVS Declined  Performed by:  Adriana Mccallum, RN

## 2023-03-16 ENCOUNTER — Other Ambulatory Visit (HOSPITAL_COMMUNITY): Payer: Self-pay | Admitting: Nephrology

## 2023-03-16 ENCOUNTER — Ambulatory Visit: Payer: Medicare Other

## 2023-03-16 ENCOUNTER — Telehealth (HOSPITAL_COMMUNITY): Payer: Self-pay

## 2023-03-16 VITALS — BP 112/72 | HR 96 | Temp 98.1°F | Resp 20 | Ht 63.0 in | Wt 190.4 lb

## 2023-03-16 DIAGNOSIS — E871 Hypo-osmolality and hyponatremia: Secondary | ICD-10-CM

## 2023-03-16 DIAGNOSIS — E860 Dehydration: Secondary | ICD-10-CM

## 2023-03-16 MED ORDER — SODIUM CHLORIDE 0.9 % IV BOLUS
1000.0000 mL | Freq: Once | INTRAVENOUS | Status: AC
  Administered 2023-03-16: 1000 mL via INTRAVENOUS

## 2023-03-16 NOTE — Telephone Encounter (Signed)
Called to schedule port placement, no answer, left vm. AB  

## 2023-03-16 NOTE — Progress Notes (Signed)
Diagnosis: Dehydration  Provider:  Chilton Greathouse MD  Procedure: IV Infusion  IV Type: Peripheral, IV Location: L Antecubital  Normal Saline, Dose: 1000 ml  Infusion Start Time: 0830  Infusion Stop Time: 0939  Post Infusion IV Care: Patient declined observation and Peripheral IV Discontinued  Discharge: Condition: Good, Destination: Home . AVS Declined  Performed by:  Wyvonne Lenz, RN

## 2023-03-17 ENCOUNTER — Ambulatory Visit: Payer: Self-pay

## 2023-03-17 NOTE — Patient Outreach (Signed)
  Care Coordination   Follow Up Visit Note   03/17/2023 Name: [name] MRN: [id number] DOB: 1956-03-18  [name] is a 67 y.o. year old female who sees Pcp, No for primary care. I spoke with  [name] by phone today.  What matters to the patients health and wellness today?  [name] reports she continues to attend infusion clinic three times/week. She states her blood pressure is sometimes up and sometimes down. She denies fall since last admission. Last office visit with PCP Dr. Zola [name] on 02/10/23 with instructions to follow up in 2 weeks. Patient did not follow up. She states she will call Dr. Zola [name] to schedule: patient reports she would like to talk with provider about putting a "port" in and Eliquis dosing.  Goals Addressed             This Visit's Progress    Care Coordination Activities       Interventions Today    Flowsheet Row Most Recent Value  Chronic Disease   Chronic disease during today's visit Other  [admission 02/01/23-02/03/23 hypocalcemia]  General Interventions   General Interventions Discussed/Reviewed General Interventions Reviewed, Doctor Visits  Doctor Visits Discussed/Reviewed Doctor Visits Discussed, PCP  PCP/Specialist Visits Compliance with follow-up visit  [last office visit with PCP 02/10/23. RNCM encouraged patient to call to schedule follow up. she was supposed to follow up two weeks from this visit.]  Mental Health Interventions   Mental Health Discussed/Reviewed --  [advised to call to schedule appointment with PCP, attend provider visits as recommended,  take medications as recommended]  Pharmacy Interventions   Pharmacy Dicussed/Reviewed Pharmacy Topics Reviewed  Safety Interventions   Safety Discussed/Reviewed Safety Discussed, Fall Risk            SDOH assessments and interventions completed:  No  Care Coordination Interventions:  Yes, provided   Follow up plan: Follow up call scheduled for 05/05/23    Encounter  Outcome:  Pt. Visit Completed   Kathyrn Sheriff, RN, MSN, BSN, CCM [facility] Care Coordinator [phone]

## 2023-03-17 NOTE — Patient Instructions (Signed)
Visit Information  Thank you for taking time to visit with me today. Please don't hesitate to contact me if I can be of assistance to you.   Following are the goals we discussed today:  Call to schedule follow up with primary care provider Continue to take medications as prescribed Continue to attend provider visits as scheduled Contact your provider with health questions or concerns as needed Continue to check and record blood pressure. Notify provider with any readings outside recommended parameters  Our next appointment is by telephone on 05/05/23 at 2:00 pm  Please call the care guide team at [phone] if you need to cancel or reschedule your appointment.   If you are experiencing a Mental Health or Behavioral Health Crisis or need someone to talk to, please call the Suicide and Crisis Lifeline: 77  Kathyrn Sheriff, RN, MSN, BSN, CCM [facility] Care Coordinator [phone]

## 2023-03-18 ENCOUNTER — Ambulatory Visit: Payer: Medicare Other

## 2023-03-18 VITALS — BP 125/82 | HR 91 | Temp 98.0°F | Resp 16 | Ht 63.0 in | Wt 189.6 lb

## 2023-03-18 DIAGNOSIS — E860 Dehydration: Secondary | ICD-10-CM

## 2023-03-18 MED ORDER — SODIUM CHLORIDE 0.9 % IV BOLUS
1000.0000 mL | Freq: Once | INTRAVENOUS | Status: AC
  Administered 2023-03-18: 1000 mL via INTRAVENOUS

## 2023-03-18 NOTE — Progress Notes (Signed)
Diagnosis: Dehydration  Provider:  Chilton Greathouse MD  Procedure: IV Infusion  IV Type: Peripheral, IV Location: L Antecubital  Normal Saline, Dose: 1000 ml  Infusion Start Time: 0826  Infusion Stop Time: 0942  Post Infusion IV Care: Peripheral IV Discontinued  Discharge: Condition: Good, Destination: Home . AVS Declined  Performed by:  Garnette Czech, RN

## 2023-03-21 ENCOUNTER — Ambulatory Visit: Payer: Medicare Other

## 2023-03-21 VITALS — BP 121/77 | HR 110 | Temp 97.3°F | Ht 63.0 in | Wt 189.0 lb

## 2023-03-21 DIAGNOSIS — E860 Dehydration: Secondary | ICD-10-CM

## 2023-03-21 MED ORDER — SODIUM CHLORIDE 0.9 % IV BOLUS
1000.0000 mL | Freq: Once | INTRAVENOUS | Status: AC
  Administered 2023-03-21: 1000 mL via INTRAVENOUS

## 2023-03-21 NOTE — Progress Notes (Signed)
Diagnosis: Dehydration  Provider:  Chilton Greathouse MD  Procedure: IV Infusion  IV Type: Peripheral, IV Location: R Hand  Normal Saline, Dose: 1000 ml  Infusion Start Time: 0822  Infusion Stop Time: 0928  Post Infusion IV Care: Peripheral IV Discontinued  Discharge: Condition: Good, Destination: Home . AVS Declined  Performed by:  Loney Hering, LPN

## 2023-03-23 ENCOUNTER — Ambulatory Visit (INDEPENDENT_AMBULATORY_CARE_PROVIDER_SITE_OTHER): Payer: Medicare Other

## 2023-03-23 VITALS — BP 143/85 | HR 94 | Temp 98.9°F | Resp 18 | Ht 63.0 in | Wt 190.2 lb

## 2023-03-23 DIAGNOSIS — E860 Dehydration: Secondary | ICD-10-CM

## 2023-03-23 MED ORDER — SODIUM CHLORIDE 0.9 % IV BOLUS
1000.0000 mL | Freq: Once | INTRAVENOUS | Status: AC
  Administered 2023-03-23: 1000 mL via INTRAVENOUS
  Filled 2023-03-23: qty 1000

## 2023-03-23 NOTE — Progress Notes (Signed)
Diagnosis: Dehydration  Provider:  Chilton Greathouse MD  Procedure: IV Infusion  IV Type: Peripheral, IV Location: L Forearm  Normal Saline, Dose: 1000 ml  Infusion Start Time: 0827  Infusion Stop Time: 0931  Post Infusion IV Care: Peripheral IV Discontinued  Discharge: Condition: Good, Destination: Home . AVS Declined  Performed by:  Adriana Mccallum, RN

## 2023-03-24 ENCOUNTER — Encounter: Payer: Self-pay | Admitting: Family Medicine

## 2023-03-24 ENCOUNTER — Ambulatory Visit (INDEPENDENT_AMBULATORY_CARE_PROVIDER_SITE_OTHER): Payer: Medicare Other | Admitting: Family Medicine

## 2023-03-24 VITALS — BP 140/100 | HR 116 | Temp 98.6°F | Resp 20 | Ht 63.0 in | Wt 189.6 lb

## 2023-03-24 DIAGNOSIS — T148XXA Other injury of unspecified body region, initial encounter: Secondary | ICD-10-CM | POA: Diagnosis not present

## 2023-03-24 DIAGNOSIS — L089 Local infection of the skin and subcutaneous tissue, unspecified: Secondary | ICD-10-CM | POA: Diagnosis not present

## 2023-03-24 DIAGNOSIS — R1032 Left lower quadrant pain: Secondary | ICD-10-CM

## 2023-03-24 DIAGNOSIS — R109 Unspecified abdominal pain: Secondary | ICD-10-CM

## 2023-03-24 DIAGNOSIS — R350 Frequency of micturition: Secondary | ICD-10-CM | POA: Diagnosis not present

## 2023-03-24 MED ORDER — CEPHALEXIN 500 MG PO CAPS: 500.0000 mg | ORAL_CAPSULE | Freq: Two times a day (BID) | ORAL | 0 refills | Status: DC

## 2023-03-24 NOTE — Assessment & Plan Note (Signed)
Keflex---  no obvious bleeding on exam F/u surgeon Go to Er if worsens

## 2023-03-24 NOTE — Assessment & Plan Note (Signed)
Pt unable to leave a ua She will have nephrology check her urine tomorrow

## 2023-03-24 NOTE — Progress Notes (Addendum)
Established Patient Office Visit  Subjective   Patient ID: [name], female    DOB: [birth year]  Age: 67 y.o. MRN: [id number]  Chief Complaint  Patient presents with   Rectal Bleeding    X2 weeks ago, pt states bleeding has gotten worse. Pt states she can see blood in her diapers    HPI Discussed the use of AI scribe software for clinical note transcription with the patient, who gave verbal consent to proceed.  History of Present Illness   The patient, with a history of ulcerative colitis and recent rectal surgery, presents with rectal bleeding and pressure. They noticed blood and a brown discharge in their pull-ups, which they initially attributed to Eliquis. They have also been taking a herbal supplement, Nerve Shield, for severe neuropathy in their feet. They report a sensation of pressure in the rectal area, similar to their previous symptoms of ulcerative colitis. They deny any urinary problems and state that their colostomy bag is functioning well. They also report intermittent abdominal pain, which they have experienced since before their surgeries. They have been receiving fluids three times a week and are scheduled to have a PICC line placed.      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 07/08/2022   High anion gap metabolic acidosis 07/06/2022   GERD without esophagitis 07/06/2022   Orthostatic hypotension 07/06/2022   Ileostomy, has currently (HCC) 06/30/2022   Hypotension due to drugs 06/29/2022   Urinary frequency 03/05/2022   Neck pain 12/08/2021   Facial numbness 12/08/2021   Tooth abscess 11/10/2021   Surgical wound breakdown, initial encounter 05/27/2021   Crohn's disease of both small and large intestine (HCC) 05/20/2021    Thrombocytosis 05/14/2021   SIRS (systemic inflammatory response syndrome) (HCC) 05/14/2021   Chronic pain of left knee 03/10/2021   Depression, major, single episode, severe (HCC) 12/18/2020   Closed fracture of left tibial plateau 10/30/2020   Diarrhea with dehydration 09/21/2020   Chronic diarrhea 08/23/2020   Metabolic acidosis 08/23/2020   Hyperglycemia 02/26/2020   Immunologic deficiency syndrome (HCC) 02/26/2020   Prothrombin gene mutation (HCC) 02/26/2020   Acute pain of left shoulder 06/04/2019   Pes anserine bursitis 04/12/2018   Low back pain 06/21/2017   Gout 06/21/2017   Superficial thrombophlebitis of right upper extremity 03/23/2017   Acute basilic vein thrombosis, right 02/16/2017   Symptomatic anemia 01/15/2017   Rectal pain 12/01/2015   IBD (inflammatory bowel disease) 10/24/2015   Anal stricture 10/24/2015   Ovarian cyst, right 06/12/2014   Acute on chronic renal failure (HCC) 04/02/2013   Hydrosalpinx 02/14/2013   Hyperparathyroidism (HCC) 09/18/2012   HSV (herpes simplex virus) anogenital infection 06/28/2012   CKD (chronic kidney disease) stage 4, GFR 15-29 ml/min (HCC) 06/17/2012   Left lower quadrant abdominal pain 06/22/2011   RESTLESS LEGS SYNDROME 08/05/2009   PULMONARY NODULE, RIGHT UPPER LOBE 02/28/2009   TOBACCO ABUSE, HX OF 02/11/2009   PAIN IN JOINT, MULTIPLE SITES 02/04/2009   Nonspecific (abnormal) findings on radiological and other examination of body structure 12/23/2008   CT, CHEST, ABNORMAL 12/23/2008   HEMANGIOMA SIMPLEX 12/16/2008   SKIN RASH 12/16/2008   TINEA CORPORIS 04/18/2008   LATERAL EPICONDYLITIS, RIGHT 04/18/2008   Morbid obesity (HCC) 02/05/2008  ANAL STENOSIS 02/05/2008   Stool culture positive for Clostridioides difficile 02/05/2008   Personal history of other endocrine, metabolic, and immunity disorders 02/05/2008   History of Crohn's disease 02/05/2008   Syncope and collapse 02/02/2008   Dehydration 12/15/2007    ANEMIA-IRON DEFICIENCY 12/15/2007   LEUKOCYTOSIS 12/15/2007   Anxiety state 12/15/2007   Disorder resulting from impaired renal function 11/16/2007   TINNITUS NOS 05/25/2007   DE QUERVAIN'S TENOSYNOVITIS 05/25/2007   Hyperlipidemia 10/23/2006   Essential hypertension 10/23/2006   Ulcerative colitis without complications (HCC) 07/28/2004   GASTRIC POLYP 11/16/2001   Past Medical History:  Diagnosis Date   Abdominal pain    Hx   Acute pyelonephritis 12/07/2020   Allergy    Anal stenosis    Anemia    Anxiety    Arthritis    Asthma    patient does not have inhaler   Blood in stool    Hx   Blood in urine    Hx   Blood transfusion without reported diagnosis    Cataract    CKD (chronic kidney disease) stage 3, GFR 30-59 ml/min (HCC)    Crohn's colitis, other complication (HCC)    De Quervain's tenosynovitis    Depression    Difficulty urinating    Hx   Easy bruising    Esophagitis    Fainting    History - resolved - due to dehydration   Fatigue    Hx   Fibroid    Hx   Gastric polyp    GERD (gastroesophageal reflux disease)    Hearing loss    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   Hemorrhoids, external    Hemorrhoids, internal    Herpes, genital    vaginal treated 07/05/12 and pt states is resolved   History of cervical dysplasia    History of small bowel obstruction    Hyperlipidemia    currently no meds   Hyperparathyroidism    Hypertension    Hypokalemia    Hx   Hypotension 06/29/2022   IBD (inflammatory bowel disease)    initially colectomy for suspected UC, 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   Obesity    Ovarian cyst    Pain in joint, pelvic region and thigh 12/16/2008   Centricity Description: HIP PAIN, LEFT  Qualifier: Diagnosis of   By: Janit Bern      Centricity Description: HIP PAIN  Qualifier: Diagnosis of   By: Janit Bern       Pain of right thumb 04/12/2018   Poor dental hygiene     Pulmonary nodule, right    right upper lobe   Rectal bleeding    Hx   Rectal pain    Hx   Renal insufficiency    CKD - stage 3   RLS (restless legs syndrome)    no meds   Sepsis secondary to UTI (HCC) 06/29/2022   Severe sepsis (HCC) 12/07/2020   Tooth infection 11/2016   right low   Ulcerative colitis    Visual disturbance    wears glasses   Weakness generalized    Hx - patient denies generalized weakness   Wears dentures    upper only   Past Surgical History:  Procedure Laterality Date   ANAL DILATION     BIOPSY  07/20/2022   Procedure: BIOPSY;  Surgeon: Meryl Dare, MD;  Location: [facility];  Service: Gastroenterology;;  CERVICAL BIOPSY  W/ LOOP ELECTRODE EXCISION     CHOLECYSTECTOMY     COLONOSCOPY     [name]   fatty tumor removed from back     X 2   HEMORRHOID SURGERY     ILEOSCOPY N/A 07/20/2022   Procedure: ILEOSCOPY THROUGH STOMA;  Surgeon: Meryl Dare, MD;  Location: [facility];  Service: Gastroenterology;  Laterality: N/A;   ILEOSTOMY CLOSURE     RESTORATIVE PROCTOCOLECTOMY     with insertion of ileoanal J Pouch with loop ileostomy   SHOULDER ARTHROSCOPY WITH CAPSULORRHAPHY Left 06/14/2019   Procedure: LEFT SHOULDER ARTHRSCOPIC REPAIR OF BONY BANKART FRACTURE;  Surgeon: Jones Broom, MD;  Location: [facility];  Service: Orthopedics;  Laterality: Left;  REQUEST 90 MINUTE   SIGMOIDOSCOPY     TOTAL ABDOMINAL HYSTERECTOMY  1998   TAH/LSO   TUBAL LIGATION     UPPER GASTROINTESTINAL ENDOSCOPY     [name]   Social History   Tobacco Use   Smoking status: Former    Current packs/day: 0.00    Average packs/day: 1 pack/day for 4.0 years (4.0 ttl pk-yrs)    Types: Cigarettes    Start date: 05/21/1975    Quit date: 05/21/1979    Years since quitting: 43.8   Smokeless tobacco: Never  Vaping Use   Vaping status: Never Used  Substance Use Topics   Alcohol use: No    Alcohol/week: 0.0 standard drinks of alcohol   Drug use: No   Social History    Socioeconomic History   Marital status: Divorced    Spouse name: Not on file   Number of children: Not on file   Years of education: Not on file   Highest education level: Not on file  Occupational History   Not on file  Tobacco Use   Smoking status: Former    Current packs/day: 0.00    Average packs/day: 1 pack/day for 4.0 years (4.0 ttl pk-yrs)    Types: Cigarettes    Start date: 05/21/1975    Quit date: 05/21/1979    Years since quitting: 43.8   Smokeless tobacco: Never  Vaping Use   Vaping status: Never Used  Substance and Sexual Activity   Alcohol use: No    Alcohol/week: 0.0 standard drinks of alcohol   Drug use: No   Sexual activity: Yes    Birth control/protection: Post-menopausal    Comment: 1st intercourse 67 yo-Fewer than 5 partners  Other Topics Concern   Not on file  Social History Narrative   Not on file   Social Determinants of Health   Financial Resource Strain: Low Risk  (05/26/2022)   Overall Financial Resource Strain (CARDIA)    Difficulty of Paying Living Expenses: Not hard at all  Food Insecurity: No Food Insecurity (02/01/2023)   Hunger Vital Sign    Worried About Running Out of Food in the Last Year: Never true    Ran Out of Food in the Last Year: Never true  Transportation Needs: No Transportation Needs (02/01/2023)   PRAPARE - Administrator, Civil Service (Medical): No    Lack of Transportation (Non-Medical): No  Physical Activity: Inactive (05/26/2022)   Exercise Vital Sign    Days of Exercise per Week: 0 days    Minutes of Exercise per Session: 0 min  Stress: No Stress Concern Present (05/26/2022)   [employer] of Occupational Health - Occupational Stress Questionnaire    Feeling of Stress : Not at all  Social Connections: Moderately  Isolated (05/26/2022)   Social Connection and Isolation Panel [NHANES]    Frequency of Communication with Friends and Family: More than three times a week    Frequency of Social Gatherings  with Friends and Family: More than three times a week    Attends Religious Services: Never    Database administrator or Organizations: No    Attends Banker Meetings: Never    Marital Status: Married  Catering manager Violence: Not At Risk (02/01/2023)   Humiliation, Afraid, Rape, and Kick questionnaire    Fear of Current or Ex-Partner: No    Emotionally Abused: No    Physically Abused: No    Sexually Abused: No   Family Status  Relation Name Status   Mother  Alive   Father  Deceased   Sister  Alive   Daughter  (Not Specified)   Nurse, mental health  (Not Specified)   Other  (Not Specified)   Neg Hx  (Not Specified)  No partnership data on file   Family History  Problem Relation Age of Onset   Hypertension Mother    Heart disease Mother        s/p pci   Ulcerative colitis Father    Hypertension Father    Heart attack Father    Diabetes Sister    Cancer Sister        uterine   Pulmonary fibrosis Sister    Ulcerative colitis Daughter    Cancer Maternal Uncle        LUNG   Irritable bowel syndrome Other        grandchildren   Colon cancer Neg Hx    Esophageal cancer Neg Hx    Stomach cancer Neg Hx    Rectal cancer Neg Hx    Allergies  Allergen Reactions   Sulfa Antibiotics Hives, Itching, Rash and Swelling   Morphine Other (See Comments)    "Gives me crazy dreams" (delusions, also) Psychosis, also    Sulfonamide Derivatives Hives, Itching and Swelling      Review of Systems  Constitutional:  Negative for fever and malaise/fatigue.  HENT:  Negative for congestion.   Eyes:  Negative for blurred vision.  Respiratory:  Negative for shortness of breath.   Cardiovascular:  Negative for chest pain, palpitations and leg swelling.  Gastrointestinal:  Positive for abdominal pain. Negative for blood in stool and nausea.  Genitourinary:  Negative for dysuria and frequency.  Musculoskeletal:  Negative for falls.  Skin:  Negative for rash.  Neurological:  Negative for  dizziness, loss of consciousness and headaches.  Endo/Heme/Allergies:  Negative for environmental allergies.  Psychiatric/Behavioral:  Negative for depression. The patient is not nervous/anxious.       Objective:     BP (!) 140/100 (BP Location: Left Arm, Patient Position: Sitting, Cuff Size: Normal)   Pulse (!) 116   Temp 98.6 F (37 C) (Oral)   Resp 20   Ht 5\' 3"  (1.6 m)   Wt 189 lb 9.6 oz (86 kg)   SpO2 99%   BMI 33.59 kg/m  BP Readings from Last 3 Encounters:  03/24/23 (!) 140/100  03/23/23 (!) 143/85  03/21/23 121/77   Wt Readings from Last 3 Encounters:  03/24/23 189 lb 9.6 oz (86 kg)  03/23/23 190 lb 3.2 oz (86.3 kg)  03/21/23 189 lb (85.7 kg)   SpO2 Readings from Last 3 Encounters:  03/24/23 99%  03/23/23 100%  03/21/23 99%      Physical Exam Vitals and nursing  note reviewed.  Constitutional:      General: She is not in acute distress.    Appearance: Normal appearance. She is well-developed.  HENT:     Head: Normocephalic and atraumatic.  Eyes:     General: No scleral icterus.       Right eye: No discharge.        Left eye: No discharge.  Cardiovascular:     Rate and Rhythm: Normal rate and regular rhythm.     Heart sounds: No murmur heard. Pulmonary:     Effort: Pulmonary effort is normal. No respiratory distress.     Breath sounds: Normal breath sounds.  Abdominal:     Tenderness: There is abdominal tenderness in the suprapubic area. There is no guarding or rebound.  Genitourinary:    Comments: Rectum Musculoskeletal:        General: Normal range of motion.     Cervical back: Normal range of motion and neck supple.     Right lower leg: No edema.     Left lower leg: No edema.  Skin:    General: Skin is warm and dry.  Neurological:     Mental Status: She is alert and oriented to person, place, and time.  Psychiatric:        Mood and Affect: Mood normal.        Behavior: Behavior normal.        Thought Content: Thought content normal.         Judgment: Judgment normal.     No results found for any visits on 03/24/23.  Last CBC Lab Results  Component Value Date   WBC 15.8 (H) 02/10/2023   HGB 10.3 (L) 02/10/2023   HCT 32.3 (L) 02/10/2023   MCV 105.3 (H) 02/10/2023   MCH 33.0 02/09/2023   RDW 16.3 (H) 02/10/2023   PLT 373.0 02/10/2023   Last metabolic panel Lab Results  Component Value Date   GLUCOSE 96 02/09/2023   NA 139 02/09/2023   K 3.9 02/09/2023   CL 110 02/09/2023   CO2 17 (L) 02/09/2023   BUN 56 (H) 02/09/2023   CREATININE 3.38 (H) 02/09/2023   GFRNONAA 14 (L) 02/09/2023   CALCIUM 7.9 (L) 02/09/2023   PHOS 4.5 02/01/2023   PROT 6.9 02/09/2023   ALBUMIN 3.8 02/09/2023   BILITOT 0.3 02/09/2023   ALKPHOS 119 02/09/2023   AST 23 02/09/2023   ALT 18 02/09/2023   ANIONGAP 12 02/09/2023   Last lipids Lab Results  Component Value Date   CHOL 205 (H) 08/03/2022   HDL 52.00 08/03/2022   LDLCALC 116 (H) 02/26/2020   LDLDIRECT 109.0 08/03/2022   TRIG 268.0 (H) 08/03/2022   CHOLHDL 4 08/03/2022   Last hemoglobin A1c Lab Results  Component Value Date   HGBA1C 5.6 08/23/2020   Last thyroid functions Lab Results  Component Value Date   TSH 3.75 12/08/2021   Last vitamin D Lab Results  Component Value Date   VD25OH 28.40 (L) 02/01/2023   Last vitamin B12 and Folate Lab Results  Component Value Date   VITAMINB12 650 03/10/2022   FOLATE >40.0 03/10/2022      The 10-year ASCVD risk score (Arnett DK, et al., 2019) is: 24.9%    Assessment & Plan:   Problem List Items Addressed This Visit       Unprioritized   Wound infection    Keflex---  no obvious bleeding on exam F/u surgeon Go to Er if worsens  Relevant Medications   cephALEXin (KEFLEX) 500 MG capsule   Urinary frequency    Pt unable to leave a ua She will have nephrology check her urine tomorrow      Left lower quadrant abdominal pain - Primary   Relevant Orders   CT ABDOMEN PELVIS W CONTRAST   CBC with  Differential/Platelet   Comprehensive metabolic panel    No follow-ups on file.    [name], DO

## 2023-03-25 ENCOUNTER — Ambulatory Visit (INDEPENDENT_AMBULATORY_CARE_PROVIDER_SITE_OTHER): Payer: Medicare Other

## 2023-03-25 ENCOUNTER — Telehealth: Payer: Self-pay | Admitting: *Deleted

## 2023-03-25 ENCOUNTER — Encounter: Payer: Self-pay | Admitting: *Deleted

## 2023-03-25 VITALS — BP 138/87 | HR 84 | Temp 98.5°F | Resp 20 | Ht 63.0 in | Wt 189.2 lb

## 2023-03-25 DIAGNOSIS — N2581 Secondary hyperparathyroidism of renal origin: Secondary | ICD-10-CM | POA: Diagnosis not present

## 2023-03-25 DIAGNOSIS — E860 Dehydration: Secondary | ICD-10-CM

## 2023-03-25 DIAGNOSIS — E88819 Insulin resistance, unspecified: Secondary | ICD-10-CM | POA: Diagnosis not present

## 2023-03-25 DIAGNOSIS — E876 Hypokalemia: Secondary | ICD-10-CM | POA: Diagnosis not present

## 2023-03-25 DIAGNOSIS — E871 Hypo-osmolality and hyponatremia: Secondary | ICD-10-CM | POA: Diagnosis not present

## 2023-03-25 DIAGNOSIS — M109 Gout, unspecified: Secondary | ICD-10-CM | POA: Diagnosis not present

## 2023-03-25 DIAGNOSIS — D631 Anemia in chronic kidney disease: Secondary | ICD-10-CM | POA: Diagnosis not present

## 2023-03-25 DIAGNOSIS — I129 Hypertensive chronic kidney disease with stage 1 through stage 4 chronic kidney disease, or unspecified chronic kidney disease: Secondary | ICD-10-CM | POA: Diagnosis not present

## 2023-03-25 DIAGNOSIS — N390 Urinary tract infection, site not specified: Secondary | ICD-10-CM | POA: Diagnosis not present

## 2023-03-25 DIAGNOSIS — D509 Iron deficiency anemia, unspecified: Secondary | ICD-10-CM | POA: Diagnosis not present

## 2023-03-25 DIAGNOSIS — N179 Acute kidney failure, unspecified: Secondary | ICD-10-CM | POA: Diagnosis not present

## 2023-03-25 DIAGNOSIS — R768 Other specified abnormal immunological findings in serum: Secondary | ICD-10-CM | POA: Diagnosis not present

## 2023-03-25 DIAGNOSIS — N184 Chronic kidney disease, stage 4 (severe): Secondary | ICD-10-CM | POA: Diagnosis not present

## 2023-03-25 LAB — COMPREHENSIVE METABOLIC PANEL
ALT: 17 U/L (ref 0–35)
AST: 22 U/L (ref 0–37)
Albumin: 3.9 g/dL (ref 3.5–5.2)
Alkaline Phosphatase: 125 U/L — ABNORMAL HIGH (ref 39–117)
BUN: 36 mg/dL — ABNORMAL HIGH (ref 6–23)
CO2: 14 mEq/L — ABNORMAL LOW (ref 19–32)
Calcium: 7.7 mg/dL — ABNORMAL LOW (ref 8.4–10.5)
Chloride: 106 mEq/L (ref 96–112)
Creatinine, Ser: 3.51 mg/dL — ABNORMAL HIGH (ref 0.40–1.20)
GFR: 12.76 mL/min — CL (ref >60.00)
Glucose, Bld: 220 mg/dL — ABNORMAL HIGH (ref 70–99)
Potassium: 3.7 mEq/L (ref 3.5–5.1)
Sodium: 135 mEq/L (ref 135–145)
Total Bilirubin: 0.5 mg/dL (ref 0.2–1.2)
Total Protein: 7 g/dL (ref 6.0–8.3)

## 2023-03-25 LAB — CBC WITH DIFFERENTIAL/PLATELET
Basophils Absolute: 0.1 10*3/uL (ref 0.0–0.1)
Basophils Relative: 0.8 % (ref 0.0–3.0)
Eosinophils Absolute: 0.4 10*3/uL (ref 0.0–0.7)
Eosinophils Relative: 2.5 % (ref 0.0–5.0)
HCT: 31.2 % — ABNORMAL LOW (ref 36.0–46.0)
Hemoglobin: 9.8 g/dL — ABNORMAL LOW (ref 12.0–15.0)
Lymphocytes Relative: 11.8 % — ABNORMAL LOW (ref 12.0–46.0)
Lymphs Abs: 1.8 10*3/uL (ref 0.7–4.0)
MCHC: 31.3 g/dL (ref 30.0–36.0)
MCV: 106.4 fl — ABNORMAL HIGH (ref 78.0–100.0)
Monocytes Absolute: 0.7 10*3/uL (ref 0.1–1.0)
Monocytes Relative: 4.9 % (ref 3.0–12.0)
Neutro Abs: 12.1 10*3/uL — ABNORMAL HIGH (ref 1.4–7.7)
Neutrophils Relative %: 80 % — ABNORMAL HIGH (ref 43.0–77.0)
Platelets: 394 10*3/uL (ref 150.0–400.0)
RBC: 2.93 Mil/uL — ABNORMAL LOW (ref 3.87–5.11)
RDW: 17 % — ABNORMAL HIGH (ref 11.5–15.5)
WBC: 15.1 10*3/uL — ABNORMAL HIGH (ref 4.0–10.5)

## 2023-03-25 MED ORDER — SODIUM CHLORIDE 0.9 % IV BOLUS
1000.0000 mL | Freq: Once | INTRAVENOUS | Status: AC
  Administered 2023-03-25: 1000 mL via INTRAVENOUS

## 2023-03-25 NOTE — Addendum Note (Signed)
Addended by: Thelma Barge D on: 03/25/2023 02:59 PM   Modules accepted: Orders

## 2023-03-25 NOTE — Telephone Encounter (Signed)
Labs faxed over to [facility] and they were notified that it was coming

## 2023-03-25 NOTE — Progress Notes (Signed)
Diagnosis: Dehydration  Provider:  Chilton Greathouse MD  Procedure: IV Infusion  IV Type: Peripheral, IV Location: L Upper Arm  Normal Saline, Dose: 1000 ml  Infusion Start Time: 0830  Infusion Stop Time: 0941  Post Infusion IV Care: Peripheral IV Discontinued  Discharge: Condition: Good, Destination: Home . AVS Declined  Performed by:  Nat Math, RN

## 2023-03-25 NOTE — Telephone Encounter (Signed)
CRITICAL VALUE STICKER  CRITICAL VALUE:  GFR 12.76   MESSENGER (representative from lab):  Janina Mayo  MD NOTIFIED: yes

## 2023-03-28 ENCOUNTER — Ambulatory Visit (INDEPENDENT_AMBULATORY_CARE_PROVIDER_SITE_OTHER): Payer: Medicare Other

## 2023-03-28 VITALS — BP 120/71 | HR 91 | Temp 97.7°F | Resp 20 | Ht 63.0 in | Wt 186.8 lb

## 2023-03-28 DIAGNOSIS — E860 Dehydration: Secondary | ICD-10-CM | POA: Diagnosis not present

## 2023-03-28 MED ORDER — SODIUM CHLORIDE 0.9 % IV BOLUS
1000.0000 mL | Freq: Once | INTRAVENOUS | Status: AC
  Administered 2023-03-28: 1000 mL via INTRAVENOUS

## 2023-03-28 NOTE — Progress Notes (Signed)
Diagnosis: Dehydration  Provider:  Chilton Greathouse MD  Procedure: IV Infusion  IV Type: Peripheral, IV Location: L Upper Arm  Normal Saline, Dose: 1000 ml  Infusion Start Time: 0822  Infusion Stop Time: 0926  Post Infusion IV Care: Peripheral IV Discontinued  Discharge: Condition: Good, Destination: Home . AVS Declined  Performed by:  Loney Hering, LPN

## 2023-03-30 ENCOUNTER — Ambulatory Visit (INDEPENDENT_AMBULATORY_CARE_PROVIDER_SITE_OTHER): Payer: Medicare Other

## 2023-03-30 VITALS — BP 165/98 | HR 90 | Temp 98.0°F | Resp 16 | Ht 63.0 in | Wt 189.0 lb

## 2023-03-30 DIAGNOSIS — E860 Dehydration: Secondary | ICD-10-CM | POA: Diagnosis not present

## 2023-03-30 MED ORDER — SODIUM CHLORIDE 0.9 % IV BOLUS
1000.0000 mL | Freq: Once | INTRAVENOUS | Status: AC
  Administered 2023-03-30: 1000 mL via INTRAVENOUS

## 2023-03-30 NOTE — Progress Notes (Signed)
Diagnosis: Dehydration  Provider:  Chilton Greathouse MD  Procedure: IV Infusion  IV Type: Peripheral, IV Location: L Hand  Normal Saline, Dose: 1000 ml  Infusion Start Time: 0835  Infusion Stop Time: 0940  Post Infusion IV Care: Patient declined observation and Peripheral IV Discontinued  Discharge: Condition: Good, Destination: Home . AVS Declined  Performed by:  Wyvonne Lenz, RN

## 2023-03-31 ENCOUNTER — Telehealth (HOSPITAL_COMMUNITY): Payer: Self-pay

## 2023-03-31 NOTE — Telephone Encounter (Signed)
Called to schedule port placement, no answer, left vm. AB  

## 2023-04-01 ENCOUNTER — Ambulatory Visit (INDEPENDENT_AMBULATORY_CARE_PROVIDER_SITE_OTHER): Payer: Medicare Other

## 2023-04-01 VITALS — BP 131/75 | HR 88 | Temp 97.6°F | Resp 20 | Ht 63.0 in | Wt 187.8 lb

## 2023-04-01 DIAGNOSIS — E860 Dehydration: Secondary | ICD-10-CM

## 2023-04-01 MED ORDER — SODIUM CHLORIDE 0.9 % IV BOLUS
1000.0000 mL | Freq: Once | INTRAVENOUS | Status: AC
  Administered 2023-04-01: 1000 mL via INTRAVENOUS
  Filled 2023-04-01: qty 1000

## 2023-04-01 NOTE — Progress Notes (Signed)
Diagnosis: Dehydration  Provider:  Chilton Greathouse MD  Procedure: IV Infusion  IV Type: Peripheral, IV Location: L Antecubital  Normal Saline, Dose: 1000 ml  Infusion Start Time: 0828  Infusion Stop Time: 0931  Post Infusion IV Care: Peripheral IV Discontinued  Discharge: Condition: Good, Destination: Home . AVS Declined  Performed by:  Adriana Mccallum, RN

## 2023-04-04 ENCOUNTER — Ambulatory Visit (INDEPENDENT_AMBULATORY_CARE_PROVIDER_SITE_OTHER): Payer: Medicare Other

## 2023-04-04 ENCOUNTER — Telehealth (HOSPITAL_BASED_OUTPATIENT_CLINIC_OR_DEPARTMENT_OTHER): Payer: Self-pay

## 2023-04-04 ENCOUNTER — Other Ambulatory Visit: Payer: Self-pay

## 2023-04-04 VITALS — BP 144/90 | HR 83 | Temp 97.8°F | Resp 18 | Ht 63.0 in | Wt 187.2 lb

## 2023-04-04 DIAGNOSIS — E860 Dehydration: Secondary | ICD-10-CM

## 2023-04-04 MED ORDER — SODIUM CHLORIDE 0.9 % IV BOLUS
1000.0000 mL | Freq: Once | INTRAVENOUS | Status: AC
  Administered 2023-04-04: 1000 mL via INTRAVENOUS

## 2023-04-04 NOTE — Progress Notes (Addendum)
Diagnosis: Dehydration  Provider:  Chilton Greathouse MD  Procedure: IV Infusion  IV Type: Peripheral, IV Location: L Hand  Normal Saline, Dose: 1000 ml  Infusion Start Time: 0818  Infusion Stop Time: 0922  Post Infusion IV Care: Observation period completed. PIV removed  Discharge: Condition: Good, Destination: Home . AVS declined  Performed by:  Adriana Mccallum, RN

## 2023-04-06 ENCOUNTER — Ambulatory Visit (INDEPENDENT_AMBULATORY_CARE_PROVIDER_SITE_OTHER): Payer: Medicare Other

## 2023-04-06 VITALS — BP 152/90 | HR 95 | Temp 98.4°F | Resp 18 | Ht 63.0 in | Wt 188.2 lb

## 2023-04-06 DIAGNOSIS — E860 Dehydration: Secondary | ICD-10-CM

## 2023-04-06 MED ORDER — SODIUM CHLORIDE 0.9 % IV BOLUS
1000.0000 mL | Freq: Once | INTRAVENOUS | Status: AC
  Administered 2023-04-06: 1000 mL via INTRAVENOUS
  Filled 2023-04-06: qty 1000

## 2023-04-06 NOTE — Progress Notes (Signed)
Diagnosis: Dehydration  Provider:  Chilton Greathouse MD  Procedure: IV Infusion  IV Type: Peripheral, IV Location: L Antecubital  Normal Saline, Dose: 1000 ml  Infusion Start Time: 0819  Infusion Stop Time: 0920  Post Infusion IV Care: Peripheral IV Discontinued  Discharge: Condition: Good, Destination: Home . AVS Declined  Performed by:  Loney Hering, LPN

## 2023-04-08 ENCOUNTER — Ambulatory Visit (INDEPENDENT_AMBULATORY_CARE_PROVIDER_SITE_OTHER): Payer: Medicare Other

## 2023-04-08 VITALS — BP 135/82 | HR 87 | Temp 97.8°F | Resp 18 | Ht 63.0 in | Wt 187.8 lb

## 2023-04-08 DIAGNOSIS — Z932 Ileostomy status: Secondary | ICD-10-CM | POA: Diagnosis not present

## 2023-04-08 DIAGNOSIS — E860 Dehydration: Secondary | ICD-10-CM

## 2023-04-08 DIAGNOSIS — K50819 Crohn's disease of both small and large intestine with unspecified complications: Secondary | ICD-10-CM

## 2023-04-08 DIAGNOSIS — R198 Other specified symptoms and signs involving the digestive system and abdomen: Secondary | ICD-10-CM | POA: Diagnosis not present

## 2023-04-08 MED ORDER — OCTREOTIDE ACETATE 20 MG IM KIT
20.0000 mg | PACK | Freq: Once | INTRAMUSCULAR | Status: AC
  Administered 2023-04-08: 20 mg via INTRAMUSCULAR

## 2023-04-08 MED ORDER — SODIUM CHLORIDE 0.9 % IV BOLUS
1000.0000 mL | Freq: Once | INTRAVENOUS | Status: AC
  Administered 2023-04-08: 1000 mL via INTRAVENOUS
  Filled 2023-04-08: qty 1000

## 2023-04-08 NOTE — Progress Notes (Deleted)
Diagnosis: Dehydration  Provider:  Chilton Greathouse MD  Procedure: IV Infusion  IV Type: Peripheral, IV Location: R Forearm  {Medications:25395}, {Dose:25397}  {Infusion Start Time:25399}  {Infusion Stop Time:25400}  Post Infusion IV Care: {CHINF Post Infusion:25398}  Discharge: {Condition:19696:::1}, {Destination:18313::"Home":1} . {CHINFAVS:28985}  Performed by:  Adriana Mccallum, RN

## 2023-04-08 NOTE — Progress Notes (Addendum)
Diagnosis: Dehydration  Provider:  Chilton Greathouse MD  Procedure: IV Infusion  IV Type: Peripheral, IV Location: R Forearm  Normal Saline, Dose: 1000 ml  Infusion Start Time: 0839  Infusion Stop Time: 0947  Post Infusion IV Care: Peripheral IV Discontinued  Discharge: Condition: Good, Destination: Home . AVS Declined  Performed by:  Adriana Mccallum, RN     Diagnosis: Crohn;s disease of small and large Intestine with complication  Provider:  Chilton Greathouse MD  Procedure: Injection  Octreotide,, Dose: 20 mg, Site: intramuscular, Number of injections: 1  Post Care:  N/A  Discharge: Condition: Good, Destination: Home . AVS Declined  Performed by:  Adriana Mccallum, RN

## 2023-04-11 ENCOUNTER — Other Ambulatory Visit: Payer: Self-pay | Admitting: Student

## 2023-04-11 ENCOUNTER — Ambulatory Visit (INDEPENDENT_AMBULATORY_CARE_PROVIDER_SITE_OTHER): Payer: Medicare Other

## 2023-04-11 VITALS — BP 179/104 | HR 85 | Temp 97.7°F | Resp 19 | Ht 63.0 in | Wt 188.2 lb

## 2023-04-11 DIAGNOSIS — K50819 Crohn's disease of both small and large intestine with unspecified complications: Secondary | ICD-10-CM

## 2023-04-11 DIAGNOSIS — E860 Dehydration: Secondary | ICD-10-CM | POA: Diagnosis not present

## 2023-04-11 MED ORDER — SODIUM CHLORIDE 0.9 % IV BOLUS
1000.0000 mL | Freq: Once | INTRAVENOUS | Status: AC
  Administered 2023-04-11: 1000 mL via INTRAVENOUS

## 2023-04-11 NOTE — Progress Notes (Signed)
Diagnosis: Dehydration  Provider:  Chilton Greathouse MD  Procedure: IV Infusion  IV Type: Peripheral, IV Location: L Antecubital  Normal Saline, Dose: 1000 ml  Infusion Start Time: 0816  Infusion Stop Time: 0925  Post Infusion IV Care: Patient declined observation. PIV removed  Discharge: Condition: Good, Destination: Home . AVS Declined  Performed by:  Rico Ala, LPN

## 2023-04-12 ENCOUNTER — Ambulatory Visit (HOSPITAL_COMMUNITY)
Admission: RE | Admit: 2023-04-12 | Discharge: 2023-04-12 | Disposition: A | Discharge status: Home / Self Care | Payer: Medicare Other | Source: Ambulatory Visit | Attending: Nephrology | Admitting: Nephrology

## 2023-04-12 ENCOUNTER — Other Ambulatory Visit: Payer: Self-pay

## 2023-04-12 ENCOUNTER — Encounter: Payer: Self-pay | Admitting: Gastroenterology

## 2023-04-12 ENCOUNTER — Encounter: Payer: Self-pay | Admitting: Family

## 2023-04-12 ENCOUNTER — Encounter: Payer: Self-pay | Admitting: Nephrology

## 2023-04-12 DIAGNOSIS — E871 Hypo-osmolality and hyponatremia: Secondary | ICD-10-CM

## 2023-04-12 DIAGNOSIS — Z87891 Personal history of nicotine dependence: Secondary | ICD-10-CM | POA: Insufficient documentation

## 2023-04-12 DIAGNOSIS — Z452 Encounter for adjustment and management of vascular access device: Secondary | ICD-10-CM | POA: Diagnosis not present

## 2023-04-12 DIAGNOSIS — F32A Depression, unspecified: Secondary | ICD-10-CM | POA: Insufficient documentation

## 2023-04-12 DIAGNOSIS — K5090 Crohn's disease, unspecified, without complications: Secondary | ICD-10-CM | POA: Insufficient documentation

## 2023-04-12 DIAGNOSIS — K219 Gastro-esophageal reflux disease without esophagitis: Secondary | ICD-10-CM | POA: Insufficient documentation

## 2023-04-12 DIAGNOSIS — E876 Hypokalemia: Secondary | ICD-10-CM | POA: Insufficient documentation

## 2023-04-12 DIAGNOSIS — I129 Hypertensive chronic kidney disease with stage 1 through stage 4 chronic kidney disease, or unspecified chronic kidney disease: Secondary | ICD-10-CM | POA: Diagnosis not present

## 2023-04-12 DIAGNOSIS — D649 Anemia, unspecified: Secondary | ICD-10-CM | POA: Diagnosis not present

## 2023-04-12 DIAGNOSIS — Z6833 Body mass index (BMI) 33.0-33.9, adult: Secondary | ICD-10-CM | POA: Insufficient documentation

## 2023-04-12 DIAGNOSIS — E669 Obesity, unspecified: Secondary | ICD-10-CM | POA: Diagnosis not present

## 2023-04-12 DIAGNOSIS — N183 Chronic kidney disease, stage 3 unspecified: Secondary | ICD-10-CM | POA: Insufficient documentation

## 2023-04-12 DIAGNOSIS — K50819 Crohn's disease of both small and large intestine with unspecified complications: Secondary | ICD-10-CM

## 2023-04-12 DIAGNOSIS — J45909 Unspecified asthma, uncomplicated: Secondary | ICD-10-CM | POA: Insufficient documentation

## 2023-04-12 DIAGNOSIS — F419 Anxiety disorder, unspecified: Secondary | ICD-10-CM | POA: Insufficient documentation

## 2023-04-12 HISTORY — PX: IR IMAGING GUIDED PORT INSERTION: IMG5740

## 2023-04-12 LAB — GLUCOSE, CAPILLARY: Glucose-Capillary: 106 mg/dL — ABNORMAL HIGH (ref 70–99)

## 2023-04-12 MED ORDER — MIDAZOLAM HCL 2 MG/2ML IJ SOLN
INTRAMUSCULAR | Status: AC
  Filled 2023-04-12: qty 2

## 2023-04-12 MED ORDER — CEFAZOLIN SODIUM-DEXTROSE 2-4 GM/100ML-% IV SOLN
INTRAVENOUS | Status: AC
  Filled 2023-04-12: qty 100

## 2023-04-12 MED ORDER — LIDOCAINE-EPINEPHRINE 1 %-1:100000 IJ SOLN
20.0000 mL | Freq: Once | INTRAMUSCULAR | Status: AC
  Administered 2023-04-12: 15 mL via INTRADERMAL

## 2023-04-12 MED ORDER — FENTANYL CITRATE (PF) 100 MCG/2ML IJ SOLN
INTRAMUSCULAR | Status: AC
  Filled 2023-04-12: qty 2

## 2023-04-12 MED ORDER — HEPARIN SOD (PORK) LOCK FLUSH 100 UNIT/ML IV SOLN
INTRAVENOUS | Status: AC
  Filled 2023-04-12: qty 5

## 2023-04-12 MED ORDER — LIDOCAINE HCL 1 % IJ SOLN
INTRAMUSCULAR | Status: AC
  Filled 2023-04-12: qty 20

## 2023-04-12 MED ORDER — MIDAZOLAM HCL 2 MG/2ML IJ SOLN
INTRAMUSCULAR | Status: AC | PRN
Start: 2023-04-12 — End: 2023-04-12
  Administered 2023-04-12 (×4): .5 mg via INTRAVENOUS

## 2023-04-12 MED ORDER — CEFAZOLIN SODIUM-DEXTROSE 2-4 GM/100ML-% IV SOLN
2.0000 g | INTRAVENOUS | Status: AC
  Administered 2023-04-12: 2 g via INTRAVENOUS

## 2023-04-12 MED ORDER — FENTANYL CITRATE (PF) 100 MCG/2ML IJ SOLN
INTRAMUSCULAR | Status: AC | PRN
  Administered 2023-04-12: 25 ug via INTRAVENOUS
  Administered 2023-04-12: 50 ug via INTRAVENOUS
  Administered 2023-04-12: 25 ug via INTRAVENOUS

## 2023-04-12 MED ORDER — LIDOCAINE-EPINEPHRINE 1 %-1:100000 IJ SOLN
INTRAMUSCULAR | Status: AC
  Filled 2023-04-12: qty 1

## 2023-04-12 MED ORDER — SODIUM CHLORIDE 0.9 % IV SOLN: INTRAVENOUS | Status: DC

## 2023-04-12 MED ORDER — LIDOCAINE HCL 1 % IJ SOLN
20.0000 mL | Freq: Once | INTRAMUSCULAR | Status: AC
  Administered 2023-04-12: 15 mL via INTRADERMAL

## 2023-04-12 NOTE — Procedures (Signed)
Interventional Radiology Procedure:   Indications: Poor venous access and requires frequent intravenous access.   Procedure: Port placement  Findings: Right jugular port, tip at SVC/RA junction  Complications: None     EBL: Minimal, less than 10 ml  Plan: Discharge in one hour.  Keep port site and incisions dry for at least 24 hours.      R. Lowella Dandy, MD  Pager: [phone]

## 2023-04-12 NOTE — H&P (Signed)
Chief Complaint: Patient was seen in consultation today for port placement  Referring Physician(s): Coladonato,Joseph  Supervising Physician: Roanna Banning  Patient Status: [facility] - Out[facility]  History of Present Illness: [name] is a 67 y.o. female with PMH significant for anemia, anxiety, asthma, CKD stage 3, Crohn's disease, depression, GERD, hypertension, hypotension, hypokalemia, and obesity being seen today for port placement. Patient requires 3x weekly IV fluid administration due to hyponatremia and fluid loss associated with her Crohn's disease. Patient has been referred to IR for image-guided port placement to facilitate her scheduled infusions.   Past Medical History:  Diagnosis Date   Abdominal pain    Hx   Acute pyelonephritis 12/07/2020   Allergy    Anal stenosis    Anemia    Anxiety    Arthritis    Asthma    patient does not have inhaler   Blood in stool    Hx   Blood in urine    Hx   Blood transfusion without reported diagnosis    Cataract    CKD (chronic kidney disease) stage 3, GFR 30-59 ml/min (HCC)    Crohn's colitis, other complication (HCC)    De Quervain's tenosynovitis    Depression    Difficulty urinating    Hx   Easy bruising    Esophagitis    Fainting    History - resolved - due to dehydration   Fatigue    Hx   Fibroid    Hx   Gastric polyp    GERD (gastroesophageal reflux disease)    Hearing loss    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   Hemorrhoids, external    Hemorrhoids, internal    Herpes, genital    vaginal treated 07/05/12 and pt states is resolved   History of cervical dysplasia    History of small bowel obstruction    Hyperlipidemia    currently no meds   Hyperparathyroidism    Hypertension    Hypokalemia    Hx   Hypotension 06/29/2022   IBD (inflammatory bowel disease)    initially colectomy for suspected UC, 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   Obesity     Ovarian cyst    Pain in joint, pelvic region and thigh 12/16/2008   Centricity Description: HIP PAIN, LEFT  Qualifier: Diagnosis of   By: Janit Bern      Centricity Description: HIP PAIN  Qualifier: Diagnosis of   By: Janit Bern       Pain of right thumb 04/12/2018   Poor dental hygiene    Pulmonary nodule, right    right upper lobe   Rectal bleeding    Hx   Rectal pain    Hx   Renal insufficiency    CKD - stage 3   RLS (restless legs syndrome)    no meds   Sepsis secondary to UTI (HCC) 06/29/2022   Severe sepsis (HCC) 12/07/2020   Tooth infection 11/2016   right low   Ulcerative colitis    Visual disturbance    wears glasses   Weakness generalized    Hx - patient denies generalized weakness   Wears dentures    upper only    Past Surgical History:  Procedure Laterality Date   ANAL DILATION     BIOPSY  07/20/2022   Procedure: BIOPSY;  Surgeon: Meryl Dare, MD;  Location: [facility];  Service:  Gastroenterology;;   CERVICAL BIOPSY  W/ LOOP ELECTRODE EXCISION     CHOLECYSTECTOMY     COLONOSCOPY     [name]   fatty tumor removed from back     X 2   HEMORRHOID SURGERY     ILEOSCOPY N/A 07/20/2022   Procedure: ILEOSCOPY THROUGH STOMA;  Surgeon: Meryl Dare, MD;  Location: [facility];  Service: Gastroenterology;  Laterality: N/A;   ILEOSTOMY CLOSURE     RESTORATIVE PROCTOCOLECTOMY     with insertion of ileoanal J Pouch with loop ileostomy   SHOULDER ARTHROSCOPY WITH CAPSULORRHAPHY Left 06/14/2019   Procedure: LEFT SHOULDER ARTHRSCOPIC REPAIR OF BONY BANKART FRACTURE;  Surgeon: Jones Broom, MD;  Location: [facility];  Service: Orthopedics;  Laterality: Left;  REQUEST 90 MINUTE   SIGMOIDOSCOPY     TOTAL ABDOMINAL HYSTERECTOMY  1998   TAH/LSO   TUBAL LIGATION     UPPER GASTROINTESTINAL ENDOSCOPY     [name]    Allergies: Sulfa antibiotics, Morphine, and Sulfonamide derivatives  Medications: Prior to Admission medications   Medication Sig  Start Date End Date Taking? Authorizing Provider  acetaminophen (TYLENOL) 500 MG tablet Take 1,000 mg by mouth every 6 (six) hours as needed (for pain/sleep).   Yes [provider]  allopurinol (ZYLOPRIM) 300 MG tablet Take 0.5 tablets (150 mg total) by mouth at bedtime. Patient taking differently: Take 300 mg by mouth at bedtime. 12/10/22  Yes [name], DO  apixaban (ELIQUIS) 5 MG TABS tablet Take 1 tablet (5 mg total) by mouth 2 (two) times daily. 02/03/23 05/04/23 Yes Dahal, Melina Schools, MD  Calcium Carbonate Antacid (TUMS EXTRA STRENGTH 750 PO) Take 1-2 tablets by mouth every 6 (six) hours as needed (CHEW for heartburn).   Yes [provider]  cephALEXin (KEFLEX) 500 MG capsule Take 1 capsule (500 mg total) by mouth 2 (two) times daily. 03/24/23  Yes Seabron Spates R, DO  cholestyramine light (PREVALITE) 4 g packet TAKE 1 PACKET (4 G TOTAL) BY MOUTH 3 (THREE) TIMES DAILY AFTER MEALS. 02/07/23  Yes [name], DO  DULoxetine (CYMBALTA) 60 MG capsule Take 2 capsules (120 mg total) by mouth daily. 02/16/23  Yes [name], DO  folic acid (FOLVITE) 1 MG tablet Take 2 tablets (2 mg total) by mouth daily. Patient taking differently: Take 1 mg by mouth daily. 05/26/17  Yes Ennever, Rose Phi, MD  GAS-X EXTRA STRENGTH 125 MG CAPS Take 125 mg by mouth at bedtime.   Yes [provider]  loperamide (IMODIUM A-D) 2 MG tablet Take 4 mg by mouth in the morning and at bedtime.   Yes [provider]  metoprolol succinate (TOPROL-XL) 100 MG 24 hr tablet Take 100 mg by mouth at bedtime.   Yes [provider]  Vitamin D, Ergocalciferol, (DRISDOL) 1.25 MG (50000 UNIT) CAPS capsule Take 50,000 Units by mouth every 30 (thirty) days.   Yes [provider]  OVER THE COUNTER MEDICATION Take 1 tablet by mouth See admin instructions. Nervive Nerve Health tablet- Take 1 tablet by mouth at bedtime    [provider]     Family History   Problem Relation Age of Onset   Hypertension Mother    Heart disease Mother        s/p pci   Ulcerative colitis Father    Hypertension Father    Heart attack Father    Diabetes Sister    Cancer Sister  uterine   Pulmonary fibrosis Sister    Ulcerative colitis Daughter    Cancer Maternal Uncle        LUNG   Irritable bowel syndrome Other        grandchildren   Colon cancer Neg Hx    Esophageal cancer Neg Hx    Stomach cancer Neg Hx    Rectal cancer Neg Hx     Social History   Socioeconomic History   Marital status: Divorced    Spouse name: Not on file   Number of children: Not on file   Years of education: Not on file   Highest education level: Not on file  Occupational History   Not on file  Tobacco Use   Smoking status: Former    Current packs/day: 0.00    Average packs/day: 1 pack/day for 4.0 years (4.0 ttl pk-yrs)    Types: Cigarettes    Start date: 05/21/1975    Quit date: 05/21/1979    Years since quitting: 43.9   Smokeless tobacco: Never  Vaping Use   Vaping status: Never Used  Substance and Sexual Activity   Alcohol use: No    Alcohol/week: 0.0 standard drinks of alcohol   Drug use: No   Sexual activity: Yes    Birth control/protection: Post-menopausal    Comment: 1st intercourse 67 yo-Fewer than 5 partners  Other Topics Concern   Not on file  Social History Narrative   Not on file   Social Determinants of Health   Financial Resource Strain: Low Risk  (05/26/2022)   Overall Financial Resource Strain (CARDIA)    Difficulty of Paying Living Expenses: Not hard at all  Food Insecurity: No Food Insecurity (02/01/2023)   Hunger Vital Sign    Worried About Running Out of Food in the Last Year: Never true    Ran Out of Food in the Last Year: Never true  Transportation Needs: No Transportation Needs (02/01/2023)   PRAPARE - Administrator, Civil Service (Medical): No    Lack of Transportation (Non-Medical): No  Physical Activity:  Inactive (05/26/2022)   Exercise Vital Sign    Days of Exercise per Week: 0 days    Minutes of Exercise per Session: 0 min  Stress: No Stress Concern Present (05/26/2022)   [employer] of Occupational Health - Occupational Stress Questionnaire    Feeling of Stress : Not at all  Social Connections: Moderately Isolated (05/26/2022)   Social Connection and Isolation Panel [NHANES]    Frequency of Communication with Friends and Family: More than three times a week    Frequency of Social Gatherings with Friends and Family: More than three times a week    Attends Religious Services: Never    Database administrator or Organizations: No    Attends Engineer, structural: Never    Marital Status: Married    Code Status: Full code  Review of Systems: A 12 point ROS discussed and pertinent positives are indicated in the HPI above.  All other systems are negative.  Review of Systems  Constitutional:  Negative for chills and fever.  Respiratory:  Negative for chest tightness and shortness of breath.   Cardiovascular:  Negative for chest pain and leg swelling.  Gastrointestinal:  Negative for abdominal pain, diarrhea, nausea and vomiting.  Neurological:  Positive for headaches. Negative for dizziness.  Psychiatric/Behavioral:  Negative for confusion.     Vital Signs: BP (!) 148/83   Pulse 84   Temp 99 F (  37.2 C) (Oral)   Resp 18   Ht 5\' 3"  (1.6 m)   Wt 188 lb (85.3 kg)   SpO2 99%   BMI 33.30 kg/m    Physical Exam Vitals reviewed.  Constitutional:      General: She is not in acute distress. Cardiovascular:     Rate and Rhythm: Normal rate and regular rhythm.     Pulses: Normal pulses.     Heart sounds: Normal heart sounds.  Pulmonary:     Effort: Pulmonary effort is normal.     Breath sounds: Normal breath sounds.  Abdominal:     Palpations: Abdomen is soft.     Tenderness: There is no abdominal tenderness.     Comments: Ostomy bag in place on right abdomen   Musculoskeletal:     Right lower leg: No edema.     Left lower leg: No edema.  Skin:    General: Skin is warm and dry.  Neurological:     Mental Status: She is alert and oriented to person, place, and time.  Psychiatric:        Mood and Affect: Mood normal.        Behavior: Behavior normal.        Thought Content: Thought content normal.        Judgment: Judgment normal.     Imaging: No results found.  Labs:  CBC: Recent Labs    02/03/23 0842 02/09/23 1231 02/10/23 1533 03/24/23 1343  WBC 8.1 13.2* 15.8* 15.1*  HGB 9.2* 9.7* 10.3* 9.8*  HCT 28.6* 30.4* 32.3* 31.2*  PLT 328 321 373.0 394.0    COAGS: No results for input(s): "INR", "APTT" in the last 8760 hours.  BMP: Recent Labs    02/02/23 0916 02/02/23 1446 02/03/23 0842 02/09/23 1231 03/24/23 1343  NA 138 137 138 139 135  K 3.1* 3.2* 3.5 3.9 3.7  CL 111 109 107 110 106  CO2 14* 16* 18* 17* 14*  GLUCOSE 120* 123* 119* 96 220*  BUN 44* 46* 47* 56* 36*  CALCIUM 7.0* 7.4* 7.1* 7.9* 7.7*  CREATININE 3.33* 3.22* 2.96* 3.38* 3.51*  GFRNONAA 14* 15* 17* 14*  --     LIVER FUNCTION TESTS: Recent Labs    02/02/23 0916 02/02/23 1446 02/09/23 1231 03/24/23 1343  BILITOT 0.7 0.7 0.3 0.5  AST 32 28 23 22   ALT 25 22 18 17   ALKPHOS 114 112 119 125*  PROT 7.0 6.7 6.9 7.0  ALBUMIN 3.3* 3.3* 3.8 3.9    TUMOR MARKERS: No results for input(s): "AFPTM", "CEA", "CA199", "CHROMGRNA" in the last 8760 hours.  Assessment and Plan:  [name] is a 67 yo female being seen today for port placement. She requires transfusion three times per week for fluids due to her Crohn's disease. Patient presents today in her usual state of health and is NPO. Pre-procedural antibiotics have been ordered. Case has been reviewed with Dr Milford Cage.  Risks and benefits of image guided port-a-catheter placement was discussed with the patient including, but not limited to bleeding, infection, pneumothorax, or fibrin sheath development and  need for additional procedures.  All of the patient's questions were answered, patient is agreeable to proceed. Consent signed and in chart.   Thank you for this interesting consult.  I greatly enjoyed meeting [name] and look forward to participating in their care.  A copy of this report was sent to the requesting provider on this date.  Electronically Signed: Kennieth Francois, PA-C  04/12/2023, 8:25 AM   I spent a total of  15 Minutes   in face to face in clinical consultation, greater than 50% of which was counseling/coordinating care for port placement.

## 2023-04-13 ENCOUNTER — Ambulatory Visit (INDEPENDENT_AMBULATORY_CARE_PROVIDER_SITE_OTHER): Payer: Medicare Other

## 2023-04-13 VITALS — BP 143/80 | HR 89 | Temp 98.9°F | Resp 17 | Ht 63.0 in | Wt 189.2 lb

## 2023-04-13 DIAGNOSIS — E860 Dehydration: Secondary | ICD-10-CM | POA: Diagnosis not present

## 2023-04-13 MED ORDER — SODIUM CHLORIDE 0.9 % IV BOLUS
1000.0000 mL | Freq: Once | INTRAVENOUS | Status: AC
  Administered 2023-04-13: 1000 mL via INTRAVENOUS
  Filled 2023-04-13: qty 1000

## 2023-04-13 NOTE — Progress Notes (Signed)
Diagnosis: Dehydration  Provider:  Chilton Greathouse MD  Procedure: IV Infusion  IV Type: Peripheral, IV Location: L Antecubital  Normal Saline, Dose: 1000 ml  Infusion Start Time: 0823  Infusion Stop Time: 0929  Post Infusion IV Care: Patient declined observation. PIV removed  Discharge: Condition: Good, Destination: Home . AVS Declined  Performed by:  Rico Ala, LPN

## 2023-04-15 ENCOUNTER — Ambulatory Visit (INDEPENDENT_AMBULATORY_CARE_PROVIDER_SITE_OTHER): Payer: Medicare Other

## 2023-04-15 VITALS — BP 145/89 | HR 93 | Temp 98.2°F | Resp 16 | Ht 63.0 in | Wt 188.6 lb

## 2023-04-15 DIAGNOSIS — E860 Dehydration: Secondary | ICD-10-CM | POA: Diagnosis not present

## 2023-04-15 MED ORDER — SODIUM CHLORIDE 0.9 % IV BOLUS
1000.0000 mL | Freq: Once | INTRAVENOUS | Status: AC
  Administered 2023-04-15: 1000 mL via INTRAVENOUS
  Filled 2023-04-15: qty 1000

## 2023-04-15 NOTE — Progress Notes (Signed)
Diagnosis: Dehydration  Provider:  Chilton Greathouse MD  Procedure: IV Infusion  IV Type: Peripheral, IV Location: L Upper Arm  Normal Saline, Dose: 1000 ml  Infusion Start Time: 0825  Infusion Stop Time: 0939  Post Infusion IV Care: Patient declined observation. PIV removed  Discharge: Condition: Good, Destination: Home . AVS Declined  Performed by:  Rico Ala, LPN

## 2023-04-16 ENCOUNTER — Encounter: Payer: Self-pay | Admitting: Nephrology

## 2023-04-16 ENCOUNTER — Encounter: Payer: Self-pay | Admitting: Gastroenterology

## 2023-04-16 ENCOUNTER — Encounter: Payer: Self-pay | Admitting: Family

## 2023-04-18 ENCOUNTER — Ambulatory Visit (INDEPENDENT_AMBULATORY_CARE_PROVIDER_SITE_OTHER): Payer: Medicare Other

## 2023-04-18 VITALS — BP 154/79 | HR 94 | Temp 97.8°F | Resp 19 | Ht 63.0 in | Wt 188.6 lb

## 2023-04-18 DIAGNOSIS — E860 Dehydration: Secondary | ICD-10-CM | POA: Diagnosis not present

## 2023-04-18 MED ORDER — HEPARIN SOD (PORK) LOCK FLUSH 100 UNIT/ML IV SOLN
500.0000 [IU] | Freq: Once | INTRAVENOUS | Status: AC | PRN
  Administered 2023-04-18: 500 [IU]
  Filled 2023-04-18: qty 5

## 2023-04-18 MED ORDER — SODIUM CHLORIDE 0.9 % IV BOLUS
1000.0000 mL | Freq: Once | INTRAVENOUS | Status: AC
  Administered 2023-04-18: 1000 mL via INTRAVENOUS
  Filled 2023-04-18: qty 1000

## 2023-04-18 NOTE — Progress Notes (Signed)
Diagnosis: Dehydration  Provider:  Chilton Greathouse MD  Procedure: IV Infusion  IV Type: Port a Cath, IV Location: R Chest  Normal Saline, Dose: 1000 ml  Infusion Start Time: 0859  Infusion Stop Time: 1007  Post Infusion IV Care: Port a Cath Deaccessed/Flushed and heparin locked per protocol.  Discharge: Condition: Good, Destination: Home . AVS Declined  Performed by:  Garnette Czech, RN

## 2023-04-20 ENCOUNTER — Ambulatory Visit (INDEPENDENT_AMBULATORY_CARE_PROVIDER_SITE_OTHER): Payer: Medicare Other

## 2023-04-20 VITALS — BP 130/79 | HR 92 | Temp 98.0°F | Resp 17 | Ht 63.0 in | Wt 189.4 lb

## 2023-04-20 DIAGNOSIS — E860 Dehydration: Secondary | ICD-10-CM | POA: Diagnosis not present

## 2023-04-20 MED ORDER — SODIUM CHLORIDE 0.9 % IV BOLUS
1000.0000 mL | Freq: Once | INTRAVENOUS | Status: AC
  Administered 2023-04-20: 1000 mL via INTRAVENOUS
  Filled 2023-04-20: qty 1000

## 2023-04-20 MED ORDER — SODIUM CHLORIDE 0.9% FLUSH
10.0000 mL | Freq: Once | INTRAVENOUS | Status: AC | PRN
  Administered 2023-04-20: 10 mL

## 2023-04-20 MED ORDER — HEPARIN SOD (PORK) LOCK FLUSH 100 UNIT/ML IV SOLN
500.0000 [IU] | Freq: Once | INTRAVENOUS | Status: AC | PRN
  Administered 2023-04-20: 500 [IU]
  Filled 2023-04-20: qty 5

## 2023-04-20 NOTE — Progress Notes (Signed)
Diagnosis: Dehydration  Provider:  Chilton Greathouse MD  Procedure: IV Infusion  IV Type: Port a Cath, IV Location: R Chest  Normal Saline, Dose: 1000 ml  Infusion Start Time: 0825  Infusion Stop Time: 0933  Post Infusion IV Care: Patient declined observation and Port a Cath Deaccessed/Flushed  Discharge: Condition: Stable, Destination: Home . AVS Declined  Performed by:  Wyvonne Lenz, RN

## 2023-04-22 ENCOUNTER — Ambulatory Visit (INDEPENDENT_AMBULATORY_CARE_PROVIDER_SITE_OTHER): Payer: Medicare Other

## 2023-04-22 VITALS — BP 157/93 | HR 102 | Temp 98.0°F | Resp 20 | Ht 63.0 in | Wt 189.8 lb

## 2023-04-22 DIAGNOSIS — E860 Dehydration: Secondary | ICD-10-CM

## 2023-04-22 MED ORDER — SODIUM CHLORIDE 0.9% FLUSH
10.0000 mL | Freq: Once | INTRAVENOUS | Status: AC | PRN
  Administered 2023-04-22: 10 mL

## 2023-04-22 MED ORDER — SODIUM CHLORIDE 0.9 % IV BOLUS
1000.0000 mL | Freq: Once | INTRAVENOUS | Status: AC
  Administered 2023-04-22: 1000 mL via INTRAVENOUS
  Filled 2023-04-22: qty 1000

## 2023-04-22 MED ORDER — HEPARIN SOD (PORK) LOCK FLUSH 100 UNIT/ML IV SOLN
500.0000 [IU] | Freq: Once | INTRAVENOUS | Status: AC | PRN
  Administered 2023-04-22: 500 [IU]
  Filled 2023-04-22: qty 5

## 2023-04-22 NOTE — Progress Notes (Signed)
Diagnosis: Dehydration  Provider:  Chilton Greathouse MD  Procedure: IV Infusion  IV Type: Port a Cath, IV Location: R Upper Chest  Normal Saline, Dose: 1000 ml  Infusion Start Time: 0816  Infusion Stop Time: 0921  Post Infusion IV Care: Patient declined observation and Port a Cath Deaccessed/Flushed and heparin locked  Discharge: Condition: Good, Destination: Home . AVS Declined  Performed by:  Wyvonne Lenz, RN

## 2023-04-25 ENCOUNTER — Ambulatory Visit (INDEPENDENT_AMBULATORY_CARE_PROVIDER_SITE_OTHER): Payer: Medicare Other

## 2023-04-25 VITALS — BP 147/95 | HR 85 | Temp 98.3°F | Resp 22 | Ht 63.0 in | Wt 188.6 lb

## 2023-04-25 DIAGNOSIS — E860 Dehydration: Secondary | ICD-10-CM | POA: Diagnosis not present

## 2023-04-25 MED ORDER — SODIUM CHLORIDE 0.9% FLUSH
10.0000 mL | Freq: Once | INTRAVENOUS | Status: AC | PRN
  Administered 2023-04-25: 10 mL

## 2023-04-25 MED ORDER — HEPARIN SOD (PORK) LOCK FLUSH 100 UNIT/ML IV SOLN
500.0000 [IU] | Freq: Once | INTRAVENOUS | Status: AC | PRN
  Administered 2023-04-25: 500 [IU]
  Filled 2023-04-25: qty 5

## 2023-04-25 MED ORDER — SODIUM CHLORIDE 0.9 % IV BOLUS
1000.0000 mL | Freq: Once | INTRAVENOUS | Status: AC
  Administered 2023-04-25: 1000 mL via INTRAVENOUS
  Filled 2023-04-25: qty 1000

## 2023-04-25 NOTE — Progress Notes (Signed)
Diagnosis:  Dehydration  Provider:  Chilton Greathouse MD  Procedure: IV Infusion  IV Type: Port a Cath, IV Location: R upper Chest  Normal Saline, Dose: 1000 ml  Infusion Start Time: 0815  Infusion Stop Time: 0922  Post Infusion IV Care: Patient declined observation and Port a Cath Deaccessed/Flushed and hepain locked  Discharge: Condition: Good, Destination: Home . AVS Declined  Performed by:  Rico Ala, LPN

## 2023-04-27 ENCOUNTER — Ambulatory Visit (INDEPENDENT_AMBULATORY_CARE_PROVIDER_SITE_OTHER): Payer: Medicare Other

## 2023-04-27 VITALS — BP 147/93 | HR 91 | Temp 97.7°F | Resp 18 | Ht 63.0 in | Wt 189.6 lb

## 2023-04-27 DIAGNOSIS — E860 Dehydration: Secondary | ICD-10-CM

## 2023-04-27 MED ORDER — HEPARIN SOD (PORK) LOCK FLUSH 100 UNIT/ML IV SOLN
500.0000 [IU] | Freq: Once | INTRAVENOUS | Status: AC | PRN
  Administered 2023-04-27: 500 [IU]

## 2023-04-27 MED ORDER — SODIUM CHLORIDE 0.9% FLUSH
10.0000 mL | Freq: Once | INTRAVENOUS | Status: AC | PRN
  Administered 2023-04-27: 10 mL

## 2023-04-27 MED ORDER — SODIUM CHLORIDE 0.9 % IV BOLUS
1000.0000 mL | Freq: Once | INTRAVENOUS | Status: AC
  Administered 2023-04-27: 1000 mL via INTRAVENOUS
  Filled 2023-04-27: qty 1000

## 2023-04-27 NOTE — Progress Notes (Signed)
Diagnosis: Dehydration  Provider:  Chilton Greathouse MD  Procedure: IV Infusion  IV Type: Port a Cath, IV Location: R Chest  Normal Saline, Dose: 1000 ml  Infusion Start Time: 0814  Infusion Stop Time: 0920  Post Infusion IV Care: Patient declined observation and Port a Cath Deaccessed/Flushed and heparin locked  Discharge: Condition: Good, Destination: Home . AVS Declined  Performed by:  Wyvonne Lenz, RN

## 2023-04-29 ENCOUNTER — Ambulatory Visit: Payer: Medicare Other

## 2023-04-29 VITALS — BP 135/82 | HR 96 | Temp 97.6°F | Resp 18 | Ht 63.0 in | Wt 187.4 lb

## 2023-04-29 DIAGNOSIS — E860 Dehydration: Secondary | ICD-10-CM

## 2023-04-29 MED ORDER — HEPARIN SOD (PORK) LOCK FLUSH 100 UNIT/ML IV SOLN
500.0000 [IU] | Freq: Once | INTRAVENOUS | Status: AC | PRN
  Administered 2023-04-29: 500 [IU]
  Filled 2023-04-29: qty 5

## 2023-04-29 MED ORDER — SODIUM CHLORIDE 0.9 % IV BOLUS
1000.0000 mL | Freq: Once | INTRAVENOUS | Status: AC
  Administered 2023-04-29: 1000 mL via INTRAVENOUS

## 2023-04-29 NOTE — Progress Notes (Signed)
No serivice. Pt did not attend appointment.

## 2023-04-29 NOTE — Progress Notes (Signed)
Diagnosis: Dehydration  Provider:  Chilton Greathouse MD  Procedure: IV Infusion  IV Type: Port a Cath, IV Location: R Chest  Normal Saline, Dose: 1000 ml  Infusion Start Time: 0823  Infusion Stop Time: 0929  Post Infusion IV Care: Patient declined observation and Port a Cath Deaccessed/Flushed and heparin locked  Discharge: Condition: Good, Destination: Home . AVS Declined  Performed by:  Wyvonne Lenz, RN

## 2023-05-02 ENCOUNTER — Telehealth: Payer: Self-pay

## 2023-05-02 ENCOUNTER — Ambulatory Visit (INDEPENDENT_AMBULATORY_CARE_PROVIDER_SITE_OTHER): Payer: Medicare Other

## 2023-05-02 VITALS — BP 153/84 | HR 87 | Temp 97.8°F | Resp 16 | Ht 63.0 in | Wt 187.8 lb

## 2023-05-02 DIAGNOSIS — E860 Dehydration: Secondary | ICD-10-CM

## 2023-05-02 MED ORDER — SODIUM CHLORIDE 0.9 % IV BOLUS
1000.0000 mL | Freq: Once | INTRAVENOUS | Status: AC
  Administered 2023-05-02: 1000 mL via INTRAVENOUS
  Filled 2023-05-02: qty 1000

## 2023-05-02 NOTE — Progress Notes (Deleted)
Diagnosis: Dehydation  Provider:  Chilton Greathouse MD  Procedure: IV Infusion  IV Type: Port a Cath, IV Location: Right upper chest  Normal Saline, Dose: 1000 ml  {Infusion Start Time:25399}  {Infusion Stop Time:25400}  Post Infusion IV Care: {CHINF Post Infusion:25398}  Discharge: Condition: Good, Destination: Home . AVS Declined  Performed by:  Rico Ala, LPN

## 2023-05-02 NOTE — Progress Notes (Deleted)
 Diagnosis: {Diagnosis:25401}  Provider:  {CHINFMD:27988::"Praveen Mannam MD"}  Procedure: {Injection/Infusion:25339}  {IV Type:25393::"Peripheral"}, {IV Location:25394}  {Medications:25395}, {Dose:25397}  {Infusion Start Time:25399}  {Infusion Stop Time:25400}  Post Infusion IV Care: {CHINF Post Infusion:25398}  Discharge: {Condition:19696:::1}, {Destination:18313::"Home":1} . {CHINFAVS:28985}  Performed by:  Rico Ala, LPN

## 2023-05-02 NOTE — Progress Notes (Signed)
Diagnosis: Dehydration  Provider:  Chilton Greathouse MD  Procedure: IV Infusion  IV Type: Peripheral, IV Location: R Antecubital  Normal Saline, Dose: 1000 ml  Infusion Start Time: 1610  Infusion Stop Time: 0934  0828 Unable to access the port. Ordering Md notified and advised to send pt to ER for further evaluation of port. Pt verbalized understanding.  Post Infusion IV Care: Peripheral IV Discontinued  Discharge: Condition: Good, Destination: Home . AVS Declined  Performed by:  Garnette Czech, RN

## 2023-05-04 ENCOUNTER — Ambulatory Visit (INDEPENDENT_AMBULATORY_CARE_PROVIDER_SITE_OTHER): Payer: Medicare Other

## 2023-05-04 VITALS — BP 133/83 | HR 90 | Temp 97.7°F | Resp 22 | Ht 63.0 in | Wt 188.6 lb

## 2023-05-04 DIAGNOSIS — E860 Dehydration: Secondary | ICD-10-CM

## 2023-05-04 MED ORDER — SODIUM CHLORIDE 0.9 % IV BOLUS
1000.0000 mL | Freq: Once | INTRAVENOUS | Status: AC
  Administered 2023-05-04: 1000 mL via INTRAVENOUS
  Filled 2023-05-04: qty 1000

## 2023-05-04 NOTE — Progress Notes (Signed)
Diagnosis: Dehydration  Provider:  Chilton Greathouse MD  Procedure: IV Infusion  IV Type: Peripheral, IV Location: L Forearm  Normal Saline, Dose: 1000 ml  Infusion Start Time: 0837  Infusion Stop Time: 0941  Post Infusion IV Care: Peripheral IV Discontinued  Discharge: Condition: Good, Destination: Home . AVS Declined  Performed by:  Rico Ala, LPN

## 2023-05-05 ENCOUNTER — Ambulatory Visit: Payer: Self-pay

## 2023-05-05 ENCOUNTER — Other Ambulatory Visit (HOSPITAL_COMMUNITY): Payer: Self-pay | Admitting: Interventional Radiology

## 2023-05-05 DIAGNOSIS — E860 Dehydration: Secondary | ICD-10-CM

## 2023-05-05 NOTE — Patient Outreach (Signed)
  Care Coordination   Follow Up Visit Note   05/05/2023 Name: [name] MRN: [id number] DOB: 1956-01-30  [name] is a 67 y.o. year old female who sees Zola Button, Grayling Congress, DO for primary care. I spoke with  [name] by phone today.  What matters to the patients health and wellness today?  Ms. Blackner reports,  "I am doing fine".  Continues to have IV infusion three times/week to prevent deydration. She states she had a Port placed to facility iv infusions. However has to go to radiology tomorrow due to follow up on port position change. She reports abscess last month treated by Primary provider has healed. Per review of chart, was taking Eliquis for Left arm DVT. Ms. Zeier reports She is no longer taking Eliquis-reports stopped approximately two months due to bleeding with abscess/wound. She has not followed up with treating provider, Eileen Stanford, NP as recommended.  Goals Addressed             This Visit's Progress    Care Coordination Activities       Interventions Today    Flowsheet Row Most Recent Value  Chronic Disease   Chronic disease during today's visit Other  [Crohn's disease, high output ilieostomy/dehydration]  General Interventions   General Interventions Discussed/Reviewed General Interventions Reviewed, Doctor Visits, Communication with  Doctor Visits Discussed/Reviewed Doctor Visits Discussed, Specialist, PCP  PCP/Specialist Visits Compliance with follow-up visit  [reviewed upcoming/scheduled appointments. encouraged to attend as scheduled/recommended. advised to contact oncology Eileen Stanford, NP to reschedule follow up re: Left arm DVT.]  Communication with PCP/Specialists  [name] Eileen Stanford, NP patient no longer taking Eliquis]  Education Interventions   Education Provided Provided Education  Provided Verbal Education On Other  [advised to continue to take medications as prescribed, attend provider visits as recommended.]  Nutrition Interventions    Nutrition Discussed/Reviewed Nutrition Reviewed  Pharmacy Interventions   Pharmacy Dicussed/Reviewed Pharmacy Topics Reviewed            SDOH assessments and interventions completed:  No  Care Coordination Interventions:  Yes, provided   Follow up plan: Follow up call scheduled for 05/23/23    Encounter Outcome:  Pt. Visit Completed   Kathyrn Sheriff, RN, MSN, BSN, CCM Care Management Coordinator [phone]

## 2023-05-05 NOTE — Patient Instructions (Signed)
Visit Information  Thank you for taking time to visit with me today. Please don't hesitate to contact me if I can be of assistance to you.   Following are the goals we discussed today:  Continue to take medications as prescribed. Continue to attend provider visits as scheduled and reschedule missed appointment with Brandon Melnick, NP Continue to eat healthy, lean meats, vegetables, fruits, avoid saturated and transfats   Our next appointment is by telephone on 05/23/23 at 1:45 pm  Please call the care guide team at [phone] if you need to cancel or reschedule your appointment.   If you are experiencing a Mental Health or Behavioral Health Crisis or need someone to talk to, please call the Suicide and Crisis Lifeline: 988 call the [place] National Suicide Prevention Lifeline: 503-101-2997 or TTY: 704-545-2962 TTY (236)554-8016) to talk to a trained counselor call 1-800-273-TALK (toll free, 24 hour hotline)  Kathyrn Sheriff, RN, MSN, BSN, CCM Care Management Coordinator [phone]

## 2023-05-06 ENCOUNTER — Ambulatory Visit: Payer: Medicare Other

## 2023-05-06 ENCOUNTER — Other Ambulatory Visit: Payer: Self-pay

## 2023-05-06 ENCOUNTER — Telehealth: Payer: Self-pay

## 2023-05-06 VITALS — BP 148/89 | HR 84 | Temp 97.7°F | Resp 20 | Ht 63.0 in | Wt 188.4 lb

## 2023-05-06 DIAGNOSIS — E860 Dehydration: Secondary | ICD-10-CM | POA: Diagnosis not present

## 2023-05-06 DIAGNOSIS — K50819 Crohn's disease of both small and large intestine with unspecified complications: Secondary | ICD-10-CM

## 2023-05-06 DIAGNOSIS — R198 Other specified symptoms and signs involving the digestive system and abdomen: Secondary | ICD-10-CM

## 2023-05-06 MED ORDER — OCTREOTIDE ACETATE 20 MG IM KIT: 20.0000 mg | PACK | Freq: Once | INTRAMUSCULAR | Status: DC

## 2023-05-06 MED ORDER — SODIUM CHLORIDE 0.9 % IV BOLUS
1000.0000 mL | Freq: Once | INTRAVENOUS | Status: AC
  Administered 2023-05-06: 1000 mL via INTRAVENOUS
  Filled 2023-05-06: qty 1000

## 2023-05-06 NOTE — Patient Outreach (Signed)
  Care Coordination   Care Coordination  Visit Note   05/06/2023 Name: Lynia Lasser MRN: [id number] DOB: 07/27/1956  Chales Abrahams KHADEDRA LETSINGER is a 67 y.o. year old female who sees Zola Button, Grayling Congress, DO for primary care.   What matters to the patients health and wellness today?  RNCM attempted to contact patient to assist in getting patient rescheduled with hematology and oncology follow up visit-Unable to reach. Per message received from Eileen Stanford, NP-Hematology oncology office to reach out to patient to reschedule office visit.   Goals Addressed             This Visit's Progress    COMPLETED: Care Coordination Activities       Interventions Today    Flowsheet Row Most Recent Value  General Interventions   General Interventions Discussed/Reviewed Communication with  Communication with PCP/Specialists  Eileen Stanford, NP-per message staff Bjorn Loser to attempt to contact patient to reschedule visit.]            SDOH assessments and interventions completed:  No  Care Coordination Interventions:  Yes, provided   Follow up plan:  as previously scheduled    Encounter Outcome:  Pt. Visit Completed   Kathyrn Sheriff, RN, MSN, BSN, CCM Care Management Coordinator [phone]

## 2023-05-06 NOTE — Progress Notes (Signed)
Diagnosis: Dehydration  Provider:  Chilton Greathouse MD  Procedure: IV Infusion  IV Type: Peripheral, IV Location: R Antecubital  Normal Saline, Dose: 1000 ml  Infusion Start Time: 0822  Infusion Stop Time: 0926  Post Infusion IV Care: Patient declined observation and Peripheral IV Discontinued  Discharge: Condition: Good, Destination: Home . AVS Provided  Performed by:  Wyvonne Lenz, RN

## 2023-05-10 ENCOUNTER — Other Ambulatory Visit (HOSPITAL_COMMUNITY): Payer: Self-pay | Admitting: Interventional Radiology

## 2023-05-10 ENCOUNTER — Ambulatory Visit (HOSPITAL_COMMUNITY)
Admission: RE | Admit: 2023-05-10 | Discharge: 2023-05-10 | Disposition: A | Discharge status: Home / Self Care | Payer: Medicare Other | Source: Ambulatory Visit | Attending: Interventional Radiology | Admitting: Interventional Radiology

## 2023-05-10 DIAGNOSIS — E860 Dehydration: Secondary | ICD-10-CM

## 2023-05-11 ENCOUNTER — Other Ambulatory Visit: Payer: Self-pay

## 2023-05-11 ENCOUNTER — Ambulatory Visit (INDEPENDENT_AMBULATORY_CARE_PROVIDER_SITE_OTHER): Payer: Medicare Other

## 2023-05-11 VITALS — BP 148/83 | HR 96 | Temp 97.7°F | Resp 20 | Ht 63.0 in | Wt 187.4 lb

## 2023-05-11 DIAGNOSIS — E860 Dehydration: Secondary | ICD-10-CM

## 2023-05-11 MED ORDER — SODIUM CHLORIDE 0.9 % IV BOLUS
1000.0000 mL | Freq: Once | INTRAVENOUS | Status: AC
  Administered 2023-05-11: 1000 mL via INTRAVENOUS
  Filled 2023-05-11: qty 1000

## 2023-05-11 MED ORDER — HEPARIN SOD (PORK) LOCK FLUSH 100 UNIT/ML IV SOLN
500.0000 [IU] | Freq: Once | INTRAVENOUS | Status: AC | PRN
  Administered 2023-05-11: 500 [IU]
  Filled 2023-05-11: qty 5

## 2023-05-11 NOTE — Progress Notes (Signed)
Diagnosis: Dehydration  Provider:  Chilton Greathouse MD  Procedure: IV Infusion  IV Type: Port a Cath, IV Location: R Chest  Normal Saline, Dose: 1000 ml  Infusion Start Time: 0825  Infusion Stop Time: 0929  Post Infusion IV Care: Patient declined observation and Port a Cath Deaccessed/Flushed and heparin locked  Discharge: Condition: Good, Destination: Home . AVS Declined  Performed by:  Wyvonne Lenz, RN

## 2023-05-13 ENCOUNTER — Inpatient Hospital Stay: Payer: Medicare Other | Attending: Family

## 2023-05-13 ENCOUNTER — Ambulatory Visit (INDEPENDENT_AMBULATORY_CARE_PROVIDER_SITE_OTHER): Payer: Medicare Other

## 2023-05-13 ENCOUNTER — Inpatient Hospital Stay (HOSPITAL_BASED_OUTPATIENT_CLINIC_OR_DEPARTMENT_OTHER): Payer: Medicare Other | Admitting: Family

## 2023-05-13 ENCOUNTER — Encounter: Payer: Self-pay | Admitting: Family

## 2023-05-13 VITALS — BP 150/96 | HR 95 | Temp 98.2°F | Resp 19 | Wt 189.1 lb

## 2023-05-13 VITALS — BP 165/107 | HR 91 | Temp 97.5°F | Resp 20 | Ht 63.0 in | Wt 187.4 lb

## 2023-05-13 DIAGNOSIS — E860 Dehydration: Secondary | ICD-10-CM | POA: Diagnosis not present

## 2023-05-13 DIAGNOSIS — D500 Iron deficiency anemia secondary to blood loss (chronic): Secondary | ICD-10-CM | POA: Diagnosis not present

## 2023-05-13 DIAGNOSIS — K51918 Ulcerative colitis, unspecified with other complication: Secondary | ICD-10-CM | POA: Diagnosis not present

## 2023-05-13 DIAGNOSIS — I82622 Acute embolism and thrombosis of deep veins of left upper extremity: Secondary | ICD-10-CM | POA: Diagnosis not present

## 2023-05-13 DIAGNOSIS — K50819 Crohn's disease of both small and large intestine with unspecified complications: Secondary | ICD-10-CM | POA: Diagnosis not present

## 2023-05-13 DIAGNOSIS — R198 Other specified symptoms and signs involving the digestive system and abdomen: Secondary | ICD-10-CM | POA: Diagnosis not present

## 2023-05-13 DIAGNOSIS — Z932 Ileostomy status: Secondary | ICD-10-CM | POA: Diagnosis not present

## 2023-05-13 LAB — CBC WITH DIFFERENTIAL (CANCER CENTER ONLY)
Abs Immature Granulocytes: 0.24 10*3/uL — ABNORMAL HIGH (ref 0.00–0.07)
Basophils Absolute: 0.1 10*3/uL (ref 0.0–0.1)
Basophils Relative: 1 %
Eosinophils Absolute: 0.5 10*3/uL (ref 0.0–0.5)
Eosinophils Relative: 4 %
HCT: 33.3 % — ABNORMAL LOW (ref 36.0–46.0)
Hemoglobin: 10.8 g/dL — ABNORMAL LOW (ref 12.0–15.0)
Immature Granulocytes: 2 %
Lymphocytes Relative: 28 %
Lymphs Abs: 3.5 10*3/uL (ref 0.7–4.0)
MCH: 33.4 pg (ref 26.0–34.0)
MCHC: 32.4 g/dL (ref 30.0–36.0)
MCV: 103.1 fL — ABNORMAL HIGH (ref 80.0–100.0)
Monocytes Absolute: 0.9 10*3/uL (ref 0.1–1.0)
Monocytes Relative: 7 %
Neutro Abs: 7.3 10*3/uL (ref 1.7–7.7)
Neutrophils Relative %: 58 %
Platelet Count: 332 10*3/uL (ref 150–400)
RBC: 3.23 MIL/uL — ABNORMAL LOW (ref 3.87–5.11)
RDW: 15.6 % — ABNORMAL HIGH (ref 11.5–15.5)
WBC Count: 12.6 10*3/uL — ABNORMAL HIGH (ref 4.0–10.5)
nRBC: 0 % (ref 0.0–0.2)

## 2023-05-13 LAB — CMP (CANCER CENTER ONLY)
ALT: 15 U/L (ref 0–44)
AST: 19 U/L (ref 15–41)
Albumin: 4.1 g/dL (ref 3.5–5.0)
Alkaline Phosphatase: 148 U/L — ABNORMAL HIGH (ref 38–126)
Anion gap: 13 (ref 5–15)
BUN: 47 mg/dL — ABNORMAL HIGH (ref 8–23)
CO2: 16 mmol/L — ABNORMAL LOW (ref 22–32)
Calcium: 8.1 mg/dL — ABNORMAL LOW (ref 8.9–10.3)
Chloride: 110 mmol/L (ref 98–111)
Creatinine: 3.34 mg/dL — ABNORMAL HIGH (ref 0.44–1.00)
GFR, Estimated: 14 mL/min — ABNORMAL LOW (ref >60)
Glucose, Bld: 130 mg/dL — ABNORMAL HIGH (ref 70–99)
Potassium: 3.4 mmol/L — ABNORMAL LOW (ref 3.5–5.1)
Sodium: 139 mmol/L (ref 135–145)
Total Bilirubin: 0.3 mg/dL (ref 0.3–1.2)
Total Protein: 7.7 g/dL (ref 6.5–8.1)

## 2023-05-13 LAB — RETICULOCYTES
Immature Retic Fract: 24.4 % — ABNORMAL HIGH (ref 2.3–15.9)
RBC.: 3.19 MIL/uL — ABNORMAL LOW (ref 3.87–5.11)
Retic Count, Absolute: 88.7 10*3/uL (ref 19.0–186.0)
Retic Ct Pct: 2.8 % (ref 0.4–3.1)

## 2023-05-13 LAB — FERRITIN: Ferritin: 78 ng/mL (ref 11–307)

## 2023-05-13 MED ORDER — SODIUM CHLORIDE 0.9 % IV BOLUS
1000.0000 mL | Freq: Once | INTRAVENOUS | Status: AC
  Administered 2023-05-13: 1000 mL via INTRAVENOUS
  Filled 2023-05-13: qty 1000

## 2023-05-13 MED ORDER — HEPARIN SOD (PORK) LOCK FLUSH 100 UNIT/ML IV SOLN
500.0000 [IU] | Freq: Once | INTRAVENOUS | Status: AC | PRN
  Administered 2023-05-13: 500 [IU]

## 2023-05-13 MED ORDER — OCTREOTIDE ACETATE 20 MG IM KIT
20.0000 mg | PACK | Freq: Once | INTRAMUSCULAR | Status: AC
  Administered 2023-05-13: 20 mg via INTRAMUSCULAR

## 2023-05-13 NOTE — Progress Notes (Signed)
Hematology and Oncology Follow Up Visit  [name] [id number] June 30, 1956 67 y.o. 05/13/2023   Principle Diagnosis:  Recurrent iron deficiency anemia secondary to ulcerative colitis Right cephalic/basilic vein thrombus - transiently positive lupus anticoagulant DVT left basilic vein diagnosed 01/27/2023   Current Therapy:        Eliquis 5 mg PO BID   Interim History:  [name] is here today for follow-up. She had fallen back in June and had a hematoma over the left eye and then later developed a rectal abscess that  states kept bleeding so she stopped her Eliquis. 2 months ago. Her PCP office reached out to let us know.  The bleeding has resolved with the healing of the abscess and she has not had any new falls.  No syncope reported.  We will have her restart at maintenance dosing 2.5 mg PO BID. We discussed this in detail and she is in agreement with the changes. She verbalized understanding that she will still take 1 tablet twice a day.  No fever, chills, n/v, cough, rash, dizziness, SOB, chest pain, palpitations, abdominal pain or changes in bowel or bladder habits.  She has not had any swelling or tenderness in her extremities.  Appetite and hydration are good per patient and weight described as stable at 189 lbs.   ECOG Performance Status: 1 - Symptomatic but completely ambulatory  Medications:  Allergies as of 05/13/2023       Reactions   Sulfa Antibiotics Hives, Itching, Rash, Swelling   Morphine Other (See Comments)   "Gives me crazy dreams" (delusions, also) Psychosis, also   Sulfonamide Derivatives Hives, Itching, Swelling        Medication List        Accurate as of May 13, 2023  3:53 PM. If you have any questions, ask your nurse or doctor.          acetaminophen 500 MG tablet Commonly known as: TYLENOL Take 1,000 mg by mouth every 6 (six) hours as needed (for pain/sleep).   allopurinol 300 MG tablet Commonly known as: ZYLOPRIM Take 0.5 tablets  (150 mg total) by mouth at bedtime. What changed: how much to take   apixaban 5 MG Tabs tablet Commonly known as: ELIQUIS Take 1 tablet (5 mg total) by mouth 2 (two) times daily.   cephALEXin 500 MG capsule Commonly known as: KEFLEX Take 1 capsule (500 mg total) by mouth 2 (two) times daily.   cholestyramine light 4 g packet Commonly known as: PREVALITE TAKE 1 PACKET (4 G TOTAL) BY MOUTH 3 (THREE) TIMES DAILY AFTER MEALS.   DULoxetine 60 MG capsule Commonly known as: CYMBALTA Take 2 capsules (120 mg total) by mouth daily.   folic acid 1 MG tablet Commonly known as: FOLVITE Take 2 tablets (2 mg total) by mouth daily. What changed: how much to take   Gas-X Extra Strength 125 MG Caps Generic drug: Simethicone Take 125 mg by mouth at bedtime.   loperamide 2 MG tablet Commonly known as: IMODIUM A-D Take 4 mg by mouth in the morning and at bedtime.   metoprolol succinate 100 MG 24 hr tablet Commonly known as: TOPROL-XL Take 100 mg by mouth at bedtime.   OVER THE COUNTER MEDICATION Take 1 tablet by mouth See admin instructions. Nervive Nerve Health tablet- Take 1 tablet by mouth at bedtime   TUMS EXTRA STRENGTH 750 PO Take 1-2 tablets by mouth every 6 (six) hours as needed (CHEW for heartburn).   Vitamin D (Ergocalciferol)  1.25 MG (50000 UNIT) Caps capsule Commonly known as: DRISDOL Take 50,000 Units by mouth every 30 (thirty) days.        Allergies:  Allergies  Allergen Reactions   Sulfa Antibiotics Hives, Itching, Rash and Swelling   Morphine Other (See Comments)    "Gives me crazy dreams" (delusions, also) Psychosis, also    Sulfonamide Derivatives Hives, Itching and Swelling    Past Medical History, Surgical history, Social history, and Family History were reviewed and updated.  Review of Systems: All other 10 point review of systems is negative.   Physical Exam:  weight is 189 lb 1.9 oz (85.8 kg). Her oral temperature is 98.2 F (36.8 C). Her blood  pressure is 150/96 (abnormal) and her pulse is 95. Her respiration is 19 and oxygen saturation is 100%.   Wt Readings from Last 3 Encounters:  05/13/23 189 lb 1.9 oz (85.8 kg)  05/13/23 187 lb 6.4 oz (85 kg)  05/11/23 187 lb 6.4 oz (85 kg)    Ocular: Sclerae unicteric, pupils equal, round and reactive to light Ear-nose-throat: Oropharynx clear, dentition fair Lymphatic: No cervical or supraclavicular adenopathy Lungs no rales or rhonchi, good excursion bilaterally Heart regular rate and rhythm, no murmur appreciated Abd soft, nontender, positive bowel sounds MSK no focal spinal tenderness, no joint edema Neuro: non-focal, well-oriented, appropriate affect Breasts: Deferred   Lab Results  Component Value Date   WBC 12.6 (H) 05/13/2023   HGB 10.8 (L) 05/13/2023   HCT 33.3 (L) 05/13/2023   MCV 103.1 (H) 05/13/2023   PLT 332 05/13/2023   Lab Results  Component Value Date   FERRITIN 137 02/09/2023   IRON 58 02/09/2023   TIBC 302 02/09/2023   UIBC 244 02/09/2023   IRONPCTSAT 19 02/09/2023   Lab Results  Component Value Date   RETICCTPCT 2.8 05/13/2023   RBC 3.23 (L) 05/13/2023   RBC 3.19 (L) 05/13/2023   No results found for: "KPAFRELGTCHN", "LAMBDASER", "KAPLAMBRATIO" No results found for: "IGGSERUM", "IGA", "IGMSERUM" No results found for: "TOTALPROTELP", "ALBUMINELP", "A1GS", "A2GS", "BETS", "BETA2SER", "GAMS", "MSPIKE", "SPEI"   Chemistry      Component Value Date/Time   NA 139 05/13/2023 1453   NA 142 09/08/2017 0923   K 3.4 (L) 05/13/2023 1453   K 3.7 09/08/2017 0923   CL 110 05/13/2023 1453   CL 97 (L) 09/08/2017 0923   CO2 16 (L) 05/13/2023 1453   CO2 28 09/08/2017 0923   BUN 47 (H) 05/13/2023 1453   BUN 49 (H) 09/08/2017 0923   CREATININE 3.34 (H) 05/13/2023 1453   CREATININE 3.1 (HH) 09/08/2017 0923      Component Value Date/Time   CALCIUM 8.1 (L) 05/13/2023 1453   CALCIUM 9.9 09/08/2017 0923   CALCIUM 8.9 04/12/2013 0851   ALKPHOS 148 (H) 05/13/2023  1453   ALKPHOS 89 (H) 09/08/2017 0923   AST 19 05/13/2023 1453   ALT 15 05/13/2023 1453   ALT 22 09/08/2017 0923   BILITOT 0.3 05/13/2023 1453       Impression and Plan: Ms. Suriano is a very pleasant 66 yo caucasian female with iron deficiency anemia secondary to intermittent GI blood loss/malabsorption with UC. 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  As mentioned above, we will reduce her Eliquis to 2.5 mg PO BID and she will restart today.  Iron studies are pending. We will replace if needed.  She is not interested in repeating an US of the left arm at this time since she has  not had any symptoms. Follow-up in 3 months.   Eileen Stanford, NP 9/6/20243:53 PM

## 2023-05-13 NOTE — Progress Notes (Signed)
Diagnosis: Dehydration  Provider:  Chilton Greathouse MD  Procedure: IV Infusion  IV Type: Port a Cath, IV Location: R Chest  Normal Saline, Dose: 1000 ml  Infusion Start Time: 0837  Infusion Stop Time: 0945   Procedure: Injection  Octreotide, Dose: 20 mg, Site: intramuscular, Number of injections: 1  Post Care: Observation period completed  iPost Infusion IV Care: Port a Cath Deaccessed/Flushed  Discharge: Condition: Good, Destination: Home . AVS Declined  Performed by:  Garnette Czech, RN

## 2023-05-14 LAB — LUPUS ANTICOAGULANT PANEL
DRVVT: 28 s (ref 0.0–47.0)
PTT Lupus Anticoagulant: 22.4 s (ref 0.0–43.5)

## 2023-05-16 ENCOUNTER — Ambulatory Visit (INDEPENDENT_AMBULATORY_CARE_PROVIDER_SITE_OTHER): Payer: Medicare Other

## 2023-05-16 VITALS — BP 150/83 | HR 85 | Temp 97.5°F | Resp 20 | Ht 63.0 in | Wt 188.4 lb

## 2023-05-16 DIAGNOSIS — E860 Dehydration: Secondary | ICD-10-CM

## 2023-05-16 LAB — IRON AND IRON BINDING CAPACITY (CC-WL,HP ONLY)
Iron: 73 ug/dL (ref 28–170)
Saturation Ratios: 16 % (ref 10.4–31.8)
TIBC: 445 ug/dL (ref 250–450)
UIBC: 372 ug/dL (ref 148–442)

## 2023-05-16 MED ORDER — SODIUM CHLORIDE 0.9% FLUSH
10.0000 mL | Freq: Once | INTRAVENOUS | Status: AC | PRN
  Administered 2023-05-16: 10 mL

## 2023-05-16 MED ORDER — SODIUM CHLORIDE 0.9 % IV BOLUS
1000.0000 mL | Freq: Once | INTRAVENOUS | Status: AC
  Administered 2023-05-16: 1000 mL via INTRAVENOUS
  Filled 2023-05-16: qty 1000

## 2023-05-16 MED ORDER — HEPARIN SOD (PORK) LOCK FLUSH 100 UNIT/ML IV SOLN
500.0000 [IU] | Freq: Once | INTRAVENOUS | Status: AC | PRN
  Administered 2023-05-16: 500 [IU]
  Filled 2023-05-16: qty 5

## 2023-05-16 NOTE — Progress Notes (Signed)
Diagnosis: Dehydration  Provider:  Chilton Greathouse MD  Procedure: IV Infusion  IV Type: Port a Cath, IV Location: R Chest  Normal Saline, Dose: 1000 ml  Infusion Start Time: 0825  Infusion Stop Time: 0927  Post Infusion IV Care: Port a Cath Deaccessed/Flushed and heparin locked  Discharge: Condition: Good, Destination: Home . AVS provided.  Performed by:  Wyvonne Lenz, RN

## 2023-05-17 ENCOUNTER — Telehealth: Payer: Self-pay | Admitting: Gastroenterology

## 2023-05-17 NOTE — Telephone Encounter (Signed)
Inbound call from patient stating that she was in the hospital and the doctor asked her " when she was going to have her hernia removed" she said "she did not have one" and he advised her that she did and it was on her Ilescopy report. Patient is requesting recommendations. Please advise.

## 2023-05-17 NOTE — Telephone Encounter (Signed)
Please see note below. It looks like patient has a small hiatal hernia noted on 02/15/23 CT chest report. Not sure if hernia is large enough for surgical repair. Please advise, thanks.

## 2023-05-17 NOTE — Telephone Encounter (Signed)
I am not sure exactly what she is referring to so this is hard for me to provide a recommendation without more information.  I am not sure exactly which doctor she spoke they said.  I looked through her chart and I cannot really tell if there talk about hiatal hernia or something else.  In general, for her, we want to avoid any elective surgery if at all possible.  No plans for hiatal hernia repair, not sure if they are referring to an abdominal wall hernia etc. again, that would have to be incarcerated or really bothering her to warrant a surgery given her recent issues.Marland Kitchen

## 2023-05-18 ENCOUNTER — Ambulatory Visit (INDEPENDENT_AMBULATORY_CARE_PROVIDER_SITE_OTHER): Payer: Medicare Other

## 2023-05-18 VITALS — BP 149/78 | HR 83 | Temp 97.5°F | Resp 16 | Ht 63.0 in | Wt 187.6 lb

## 2023-05-18 DIAGNOSIS — E860 Dehydration: Secondary | ICD-10-CM | POA: Diagnosis not present

## 2023-05-18 MED ORDER — HEPARIN SOD (PORK) LOCK FLUSH 100 UNIT/ML IV SOLN
500.0000 [IU] | Freq: Once | INTRAVENOUS | Status: AC | PRN
  Administered 2023-05-18: 500 [IU]
  Filled 2023-05-18: qty 5

## 2023-05-18 MED ORDER — SODIUM CHLORIDE 0.9 % IV BOLUS
1000.0000 mL | Freq: Once | INTRAVENOUS | Status: AC
  Administered 2023-05-18: 1000 mL via INTRAVENOUS

## 2023-05-18 NOTE — Telephone Encounter (Signed)
Spoke with pt and she is aware of recommendations per Dr. Adela Lank.

## 2023-05-18 NOTE — Addendum Note (Signed)
Encounter addended by: Edward Qualia on: 05/18/2023 4:27 PM  Actions taken: Imaging Exam ended

## 2023-05-18 NOTE — Progress Notes (Signed)
Diagnosis: Dehydration  Provider:  Chilton Greathouse MD  Procedure: IV Infusion  IV Type: Port a Cath, IV Location: R Chest  Normal Saline, Dose: 1000 ml  Infusion Start Time: 0820  Infusion Stop Time: 0927  Post Infusion IV Care: Port a Cath Deaccessed/Flushed  Discharge: Condition: Good, Destination: Home . AVS Declined  Performed by:  Nat Math, RN

## 2023-05-20 ENCOUNTER — Ambulatory Visit (INDEPENDENT_AMBULATORY_CARE_PROVIDER_SITE_OTHER): Payer: Medicare Other

## 2023-05-20 VITALS — BP 148/87 | HR 88 | Temp 97.6°F | Resp 16 | Ht 63.0 in | Wt 187.0 lb

## 2023-05-20 DIAGNOSIS — E860 Dehydration: Secondary | ICD-10-CM

## 2023-05-20 MED ORDER — SODIUM CHLORIDE 0.9 % IV BOLUS
1000.0000 mL | Freq: Once | INTRAVENOUS | Status: AC
  Administered 2023-05-20: 1000 mL via INTRAVENOUS
  Filled 2023-05-20: qty 1000

## 2023-05-20 MED ORDER — HEPARIN SOD (PORK) LOCK FLUSH 100 UNIT/ML IV SOLN
500.0000 [IU] | Freq: Once | INTRAVENOUS | Status: AC | PRN
  Administered 2023-05-20: 500 [IU]
  Filled 2023-05-20: qty 5

## 2023-05-20 NOTE — Progress Notes (Signed)
Diagnosis: Dehydration  Provider:  Chilton Greathouse MD  Procedure: IV Infusion  IV Type: Port a Cath, IV Location: R Upper Arm  Normal Saline, Dose: 1000 ml  Infusion Start Time: 0824  Infusion Stop Time: 0929  Post Infusion IV Care: Port a Cath Deaccessed/Flushed  Discharge: Condition: Good, Destination: Home . AVS Declined  Performed by:  Wyvonne Lenz, RN

## 2023-05-23 ENCOUNTER — Ambulatory Visit (INDEPENDENT_AMBULATORY_CARE_PROVIDER_SITE_OTHER): Payer: Medicare Other

## 2023-05-23 ENCOUNTER — Telehealth: Payer: Self-pay

## 2023-05-23 VITALS — BP 139/89 | HR 87 | Temp 98.0°F | Resp 16 | Ht 63.0 in | Wt 188.0 lb

## 2023-05-23 DIAGNOSIS — E860 Dehydration: Secondary | ICD-10-CM | POA: Diagnosis not present

## 2023-05-23 MED ORDER — HEPARIN SOD (PORK) LOCK FLUSH 100 UNIT/ML IV SOLN
500.0000 [IU] | Freq: Once | INTRAVENOUS | Status: AC | PRN
  Administered 2023-05-23: 500 [IU]

## 2023-05-23 MED ORDER — SODIUM CHLORIDE 0.9 % IV BOLUS
1000.0000 mL | Freq: Once | INTRAVENOUS | Status: AC
  Administered 2023-05-23: 1000 mL via INTRAVENOUS

## 2023-05-23 NOTE — Patient Outreach (Signed)
Care Coordination   05/23/2023 Name: Leshanda Crouse MRN: [id number] DOB: July 16, 1956   Care Coordination Outreach Attempts:  An unsuccessful telephone outreach was attempted for a scheduled appointment today.  Follow Up Plan:  Additional outreach attempts will be made to offer the patient care coordination information and services.   Encounter Outcome:  No Answer   Care Coordination Interventions:  No, not indicated    Kathyrn Sheriff, RN, MSN, BSN, CCM Care Management Coordinator [phone]

## 2023-05-23 NOTE — Progress Notes (Signed)
Diagnosis: Dehydration  Provider:  Chilton Greathouse MD  Procedure: IV Infusion  IV Type: Port a Cath, IV Location: R Chest  Normal Saline, Dose: 1000 ml  Infusion Start Time: 0852  Infusion Stop Time: 0957  Post Infusion IV Care: Patient declined observation and Port a Cath Deaccessed/Flushed and heparin locked  Discharge: Condition: Stable, Destination: Home . AVS Declined  Performed by:  Wyvonne Lenz, RN

## 2023-05-25 ENCOUNTER — Other Ambulatory Visit (HOSPITAL_COMMUNITY): Payer: Self-pay | Admitting: Nephrology

## 2023-05-25 ENCOUNTER — Ambulatory Visit (INDEPENDENT_AMBULATORY_CARE_PROVIDER_SITE_OTHER): Payer: Medicare Other

## 2023-05-25 VITALS — BP 129/80 | HR 67 | Temp 97.7°F | Resp 18 | Ht 63.0 in | Wt 186.0 lb

## 2023-05-25 DIAGNOSIS — E860 Dehydration: Secondary | ICD-10-CM

## 2023-05-25 DIAGNOSIS — E871 Hypo-osmolality and hyponatremia: Secondary | ICD-10-CM

## 2023-05-25 MED ORDER — SODIUM CHLORIDE 0.9 % IV BOLUS
1000.0000 mL | Freq: Once | INTRAVENOUS | Status: AC
  Administered 2023-05-25: 1000 mL via INTRAVENOUS
  Filled 2023-05-25: qty 1000

## 2023-05-25 MED ORDER — HEPARIN SOD (PORK) LOCK FLUSH 100 UNIT/ML IV SOLN: 500.0000 [IU] | Freq: Once | INTRAVENOUS | Status: DC | PRN

## 2023-05-25 NOTE — Progress Notes (Signed)
Diagnosis: Dehydration  Provider:  Chilton Greathouse MD  Procedure: IV Infusion  IV Type: Peripheral, IV Location: R Hand  Unable to use implanted port due to no blood return. IR called and left a voice mail to schedule  an appointment for further evaluation of port.  Normal Saline, Dose: 1000 ml  Infusion Start Time: 0859  Infusion Stop Time: 1016  Post Infusion IV Care: Peripheral IV Discontinued  Discharge: Condition: Good, Destination: Home . AVS Declined  Performed by:  Garnette Czech, RN

## 2023-05-27 ENCOUNTER — Ambulatory Visit (INDEPENDENT_AMBULATORY_CARE_PROVIDER_SITE_OTHER): Payer: Medicare Other

## 2023-05-27 ENCOUNTER — Encounter: Payer: Self-pay | Admitting: Physician Assistant

## 2023-05-27 ENCOUNTER — Ambulatory Visit: Payer: Medicare Other | Admitting: Physician Assistant

## 2023-05-27 ENCOUNTER — Ambulatory Visit (INDEPENDENT_AMBULATORY_CARE_PROVIDER_SITE_OTHER): Payer: Medicare Other | Admitting: Physician Assistant

## 2023-05-27 VITALS — BP 148/94 | HR 87 | Temp 97.5°F | Resp 18 | Ht 63.0 in | Wt 187.4 lb

## 2023-05-27 VITALS — BP 138/72 | HR 98 | Temp 98.3°F | Resp 20 | Wt 189.0 lb

## 2023-05-27 DIAGNOSIS — R768 Other specified abnormal immunological findings in serum: Secondary | ICD-10-CM | POA: Diagnosis not present

## 2023-05-27 DIAGNOSIS — S20161A Insect bite (nonvenomous) of breast, right breast, initial encounter: Secondary | ICD-10-CM | POA: Diagnosis not present

## 2023-05-27 DIAGNOSIS — N184 Chronic kidney disease, stage 4 (severe): Secondary | ICD-10-CM | POA: Diagnosis not present

## 2023-05-27 DIAGNOSIS — E876 Hypokalemia: Secondary | ICD-10-CM | POA: Diagnosis not present

## 2023-05-27 DIAGNOSIS — D631 Anemia in chronic kidney disease: Secondary | ICD-10-CM | POA: Diagnosis not present

## 2023-05-27 DIAGNOSIS — E871 Hypo-osmolality and hyponatremia: Secondary | ICD-10-CM | POA: Diagnosis not present

## 2023-05-27 DIAGNOSIS — E88819 Insulin resistance, unspecified: Secondary | ICD-10-CM | POA: Diagnosis not present

## 2023-05-27 DIAGNOSIS — N2581 Secondary hyperparathyroidism of renal origin: Secondary | ICD-10-CM | POA: Diagnosis not present

## 2023-05-27 DIAGNOSIS — W57XXXA Bitten or stung by nonvenomous insect and other nonvenomous arthropods, initial encounter: Secondary | ICD-10-CM | POA: Diagnosis not present

## 2023-05-27 DIAGNOSIS — K5090 Crohn's disease, unspecified, without complications: Secondary | ICD-10-CM | POA: Diagnosis not present

## 2023-05-27 DIAGNOSIS — E860 Dehydration: Secondary | ICD-10-CM | POA: Diagnosis not present

## 2023-05-27 DIAGNOSIS — I129 Hypertensive chronic kidney disease with stage 1 through stage 4 chronic kidney disease, or unspecified chronic kidney disease: Secondary | ICD-10-CM | POA: Diagnosis not present

## 2023-05-27 DIAGNOSIS — M109 Gout, unspecified: Secondary | ICD-10-CM | POA: Diagnosis not present

## 2023-05-27 DIAGNOSIS — N179 Acute kidney failure, unspecified: Secondary | ICD-10-CM | POA: Diagnosis not present

## 2023-05-27 DIAGNOSIS — D509 Iron deficiency anemia, unspecified: Secondary | ICD-10-CM | POA: Diagnosis not present

## 2023-05-27 MED ORDER — SODIUM CHLORIDE 0.9 % IV BOLUS
1000.0000 mL | Freq: Once | INTRAVENOUS | Status: AC
  Administered 2023-05-27: 1000 mL via INTRAVENOUS
  Filled 2023-05-27: qty 1000

## 2023-05-27 NOTE — Progress Notes (Signed)
Established patient visit   Patient: [name]   DOB: [birth year]   67 y.o. Female  MRN: [id number] Visit Date: 05/27/2023  Today's healthcare provider: Alfredia Ferguson, PA-C   Cc. Tick bite  Subjective    HPI   Pt reports a tick on the outside of her right breast that she found last week. Estimated 05/19/23. Unsure how long it was there. Denies rash. Reports overall fatigue for weeks, she saw her nephrologist who recommended tick borne disease testing.   Medications: Outpatient Medications Prior to Visit  Medication Sig   acetaminophen (TYLENOL) 500 MG tablet Take 1,000 mg by mouth every 6 (six) hours as needed (for pain/sleep).   allopurinol (ZYLOPRIM) 300 MG tablet Take 0.5 tablets (150 mg total) by mouth at bedtime. (Patient taking differently: Take 300 mg by mouth at bedtime.)   Calcium Carbonate Antacid (TUMS EXTRA STRENGTH 750 PO) Take 1-2 tablets by mouth every 6 (six) hours as needed (CHEW for heartburn).   cholestyramine light (PREVALITE) 4 g packet TAKE 1 PACKET (4 G TOTAL) BY MOUTH 3 (THREE) TIMES DAILY AFTER MEALS.   DULoxetine (CYMBALTA) 60 MG capsule Take 2 capsules (120 mg total) by mouth daily.   folic acid (FOLVITE) 1 MG tablet Take 2 tablets (2 mg total) by mouth daily. (Patient taking differently: Take 1 mg by mouth daily.)   GAS-X EXTRA STRENGTH 125 MG CAPS Take 125 mg by mouth at bedtime.   loperamide (IMODIUM A-D) 2 MG tablet Take 4 mg by mouth in the morning and at bedtime.   metoprolol succinate (TOPROL-XL) 100 MG 24 hr tablet Take 100 mg by mouth at bedtime.   OVER THE COUNTER MEDICATION Take 1 tablet by mouth See admin instructions. Nervive Nerve Health tablet- Take 1 tablet by mouth at bedtime   Vitamin D, Ergocalciferol, (DRISDOL) 1.25 MG (50000 UNIT) CAPS capsule Take 50,000 Units by mouth every 30 (thirty) days.   apixaban (ELIQUIS) 5 MG TABS tablet Take 1 tablet (5 mg total) by mouth 2 (two) times daily. (Patient not taking: Reported on  05/13/2023)   [DISCONTINUED] cephALEXin (KEFLEX) 500 MG capsule Take 1 capsule (500 mg total) by mouth 2 (two) times daily. (Patient not taking: Reported on 05/05/2023)   No facility-administered medications prior to visit.    Review of Systems  Constitutional:  Negative for fatigue and fever.  Respiratory:  Negative for cough and shortness of breath.   Cardiovascular:  Negative for chest pain and leg swelling.  Gastrointestinal:  Negative for abdominal pain.  Neurological:  Negative for dizziness and headaches.      Objective    BP 138/72 (BP Location: Left Arm, Patient Position: Sitting, Cuff Size: Normal)   Pulse 98   Temp 98.3 F (36.8 C) (Oral)   Resp 20   Wt 189 lb (85.7 kg)   SpO2 99%   BMI 33.48 kg/m    Physical Exam Constitutional:      General: She is awake.     Appearance: She is well-developed.  HENT:     Head: Normocephalic.  Eyes:     Conjunctiva/sclera: Conjunctivae normal.  Cardiovascular:     Rate and Rhythm: Normal rate and regular rhythm.     Heart sounds: Normal heart sounds.  Pulmonary:     Effort: Pulmonary effort is normal.     Breath sounds: Normal breath sounds.  Skin:    General: Skin is warm.     Comments: Top of right breast with a  2 mm healing wound consistent w/ a bug bite. No surrounding rash  Neurological:     Mental Status: She is alert and oriented to person, place, and time.  Psychiatric:        Attention and Perception: Attention normal.        Mood and Affect: Mood normal.        Speech: Speech normal.        Behavior: Behavior is cooperative.     No results found for any visits on 05/27/23.  Assessment & Plan     1. 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      I, Alfredia Ferguson, PA-C have reviewed all documentation for this visit. The documentation on   05/27/23   for the exam, diagnosis, procedures, and orders are all accurate and complete.    Alfredia Ferguson, PA-C  [facility] at [facility] [phone] (phone) [phone] (fax)  [facility]

## 2023-05-27 NOTE — Progress Notes (Signed)
Diagnosis: Dehydration  Provider:  Chilton Greathouse MD  Procedure: IV Infusion  IV Type: Peripheral, IV Location: L Forearm  Normal Saline, Dose: 1000 ml  Infusion Start Time: 0824  Infusion Stop Time: 0929  Post Infusion IV Care: Patient declined observation and Peripheral IV Discontinued  Discharge: Condition: Good, Destination: Home . AVS Declined  Performed by:  Wyvonne Lenz, RN

## 2023-05-28 ENCOUNTER — Emergency Department (HOSPITAL_COMMUNITY): Payer: Medicare Other

## 2023-05-28 ENCOUNTER — Inpatient Hospital Stay (HOSPITAL_COMMUNITY)
Admission: EM | Admit: 2023-05-28 | Discharge: 2023-06-01 | DRG: 872 | Disposition: A | Discharge status: Home / Self Care | Payer: Medicare Other | Attending: Internal Medicine | Admitting: Internal Medicine

## 2023-05-28 ENCOUNTER — Encounter (HOSPITAL_COMMUNITY): Payer: Self-pay

## 2023-05-28 DIAGNOSIS — K219 Gastro-esophageal reflux disease without esophagitis: Secondary | ICD-10-CM | POA: Diagnosis present

## 2023-05-28 DIAGNOSIS — Z7901 Long term (current) use of anticoagulants: Secondary | ICD-10-CM

## 2023-05-28 DIAGNOSIS — E785 Hyperlipidemia, unspecified: Secondary | ICD-10-CM | POA: Diagnosis not present

## 2023-05-28 DIAGNOSIS — A419 Sepsis, unspecified organism: Secondary | ICD-10-CM | POA: Diagnosis not present

## 2023-05-28 DIAGNOSIS — Z90710 Acquired absence of both cervix and uterus: Secondary | ICD-10-CM

## 2023-05-28 DIAGNOSIS — I120 Hypertensive chronic kidney disease with stage 5 chronic kidney disease or end stage renal disease: Secondary | ICD-10-CM | POA: Diagnosis not present

## 2023-05-28 DIAGNOSIS — I700 Atherosclerosis of aorta: Secondary | ICD-10-CM | POA: Diagnosis present

## 2023-05-28 DIAGNOSIS — E871 Hypo-osmolality and hyponatremia: Secondary | ICD-10-CM | POA: Diagnosis not present

## 2023-05-28 DIAGNOSIS — Z87410 Personal history of cervical dysplasia: Secondary | ICD-10-CM

## 2023-05-28 DIAGNOSIS — K529 Noninfective gastroenteritis and colitis, unspecified: Secondary | ICD-10-CM | POA: Diagnosis present

## 2023-05-28 DIAGNOSIS — R198 Other specified symptoms and signs involving the digestive system and abdomen: Secondary | ICD-10-CM

## 2023-05-28 DIAGNOSIS — E860 Dehydration: Secondary | ICD-10-CM | POA: Diagnosis present

## 2023-05-28 DIAGNOSIS — N189 Chronic kidney disease, unspecified: Secondary | ICD-10-CM | POA: Diagnosis present

## 2023-05-28 DIAGNOSIS — N179 Acute kidney failure, unspecified: Secondary | ICD-10-CM | POA: Diagnosis not present

## 2023-05-28 DIAGNOSIS — G2581 Restless legs syndrome: Secondary | ICD-10-CM | POA: Diagnosis not present

## 2023-05-28 DIAGNOSIS — Z6833 Body mass index (BMI) 33.0-33.9, adult: Secondary | ICD-10-CM

## 2023-05-28 DIAGNOSIS — N185 Chronic kidney disease, stage 5: Secondary | ICD-10-CM | POA: Diagnosis not present

## 2023-05-28 DIAGNOSIS — F32A Depression, unspecified: Secondary | ICD-10-CM | POA: Diagnosis present

## 2023-05-28 DIAGNOSIS — K449 Diaphragmatic hernia without obstruction or gangrene: Secondary | ICD-10-CM | POA: Diagnosis not present

## 2023-05-28 DIAGNOSIS — E8722 Chronic metabolic acidosis: Secondary | ICD-10-CM | POA: Diagnosis not present

## 2023-05-28 DIAGNOSIS — N390 Urinary tract infection, site not specified: Principal | ICD-10-CM

## 2023-05-28 DIAGNOSIS — D638 Anemia in other chronic diseases classified elsewhere: Secondary | ICD-10-CM | POA: Diagnosis not present

## 2023-05-28 DIAGNOSIS — D539 Nutritional anemia, unspecified: Secondary | ICD-10-CM | POA: Diagnosis present

## 2023-05-28 DIAGNOSIS — E876 Hypokalemia: Secondary | ICD-10-CM | POA: Diagnosis present

## 2023-05-28 DIAGNOSIS — J45909 Unspecified asthma, uncomplicated: Secondary | ICD-10-CM | POA: Diagnosis present

## 2023-05-28 DIAGNOSIS — Z882 Allergy status to sulfonamides status: Secondary | ICD-10-CM

## 2023-05-28 DIAGNOSIS — R112 Nausea with vomiting, unspecified: Secondary | ICD-10-CM | POA: Diagnosis not present

## 2023-05-28 DIAGNOSIS — Z8249 Family history of ischemic heart disease and other diseases of the circulatory system: Secondary | ICD-10-CM

## 2023-05-28 DIAGNOSIS — Z87891 Personal history of nicotine dependence: Secondary | ICD-10-CM

## 2023-05-28 DIAGNOSIS — F411 Generalized anxiety disorder: Secondary | ICD-10-CM | POA: Diagnosis present

## 2023-05-28 DIAGNOSIS — Z95828 Presence of other vascular implants and grafts: Secondary | ICD-10-CM

## 2023-05-28 DIAGNOSIS — I10 Essential (primary) hypertension: Secondary | ICD-10-CM | POA: Diagnosis not present

## 2023-05-28 DIAGNOSIS — R6510 Systemic inflammatory response syndrome (SIRS) of non-infectious origin without acute organ dysfunction: Secondary | ICD-10-CM | POA: Diagnosis present

## 2023-05-28 DIAGNOSIS — K5090 Crohn's disease, unspecified, without complications: Secondary | ICD-10-CM | POA: Diagnosis not present

## 2023-05-28 DIAGNOSIS — Z1152 Encounter for screening for COVID-19: Secondary | ICD-10-CM | POA: Diagnosis not present

## 2023-05-28 DIAGNOSIS — E8809 Other disorders of plasma-protein metabolism, not elsewhere classified: Secondary | ICD-10-CM | POA: Diagnosis not present

## 2023-05-28 DIAGNOSIS — Z86718 Personal history of other venous thrombosis and embolism: Secondary | ICD-10-CM

## 2023-05-28 DIAGNOSIS — Z885 Allergy status to narcotic agent status: Secondary | ICD-10-CM

## 2023-05-28 DIAGNOSIS — E669 Obesity, unspecified: Secondary | ICD-10-CM | POA: Diagnosis present

## 2023-05-28 DIAGNOSIS — Z932 Ileostomy status: Secondary | ICD-10-CM | POA: Diagnosis not present

## 2023-05-28 DIAGNOSIS — Z8719 Personal history of other diseases of the digestive system: Secondary | ICD-10-CM

## 2023-05-28 DIAGNOSIS — Z79899 Other long term (current) drug therapy: Secondary | ICD-10-CM

## 2023-05-28 DIAGNOSIS — Z9049 Acquired absence of other specified parts of digestive tract: Secondary | ICD-10-CM

## 2023-05-28 DIAGNOSIS — Z91041 Radiographic dye allergy status: Secondary | ICD-10-CM

## 2023-05-28 LAB — COMPREHENSIVE METABOLIC PANEL
ALT: 26 U/L (ref 0–44)
AST: 20 U/L (ref 15–41)
Albumin: 3.3 g/dL — ABNORMAL LOW (ref 3.5–5.0)
Alkaline Phosphatase: 145 U/L — ABNORMAL HIGH (ref 38–126)
Anion gap: 8 (ref 5–15)
BUN: 49 mg/dL — ABNORMAL HIGH (ref 8–23)
CO2: 16 mmol/L — ABNORMAL LOW (ref 22–32)
Calcium: 8.4 mg/dL — ABNORMAL LOW (ref 8.9–10.3)
Chloride: 111 mmol/L (ref 98–111)
Creatinine, Ser: 3.68 mg/dL — ABNORMAL HIGH (ref 0.44–1.00)
GFR, Estimated: 13 mL/min — ABNORMAL LOW (ref >60)
Glucose, Bld: 145 mg/dL — ABNORMAL HIGH (ref 70–99)
Potassium: 3.9 mmol/L (ref 3.5–5.1)
Sodium: 135 mmol/L (ref 135–145)
Total Bilirubin: 0.5 mg/dL (ref 0.3–1.2)
Total Protein: 7.6 g/dL (ref 6.5–8.1)

## 2023-05-28 LAB — LIPASE, BLOOD: Lipase: 114 U/L — ABNORMAL HIGH (ref 11–51)

## 2023-05-28 LAB — CBC
HCT: 40.1 % (ref 36.0–46.0)
Hemoglobin: 12.8 g/dL (ref 12.0–15.0)
MCH: 33.1 pg (ref 26.0–34.0)
MCHC: 31.9 g/dL (ref 30.0–36.0)
MCV: 103.6 fL — ABNORMAL HIGH (ref 80.0–100.0)
Platelets: 411 10*3/uL — ABNORMAL HIGH (ref 150–400)
RBC: 3.87 MIL/uL (ref 3.87–5.11)
RDW: 15.9 % — ABNORMAL HIGH (ref 11.5–15.5)
WBC: 19.3 10*3/uL — ABNORMAL HIGH (ref 4.0–10.5)
nRBC: 0.2 % (ref 0.0–0.2)

## 2023-05-28 LAB — URINALYSIS, W/ REFLEX TO CULTURE (INFECTION SUSPECTED)
Bilirubin Urine: NEGATIVE
Glucose, UA: 50 mg/dL — AB
Ketones, ur: NEGATIVE mg/dL
Nitrite: NEGATIVE
Protein, ur: 100 mg/dL — AB
Specific Gravity, Urine: 1.016 (ref 1.005–1.030)
WBC, UA: >50 WBC/hpf (ref 0–5)
pH: 5 (ref 5.0–8.0)

## 2023-05-28 LAB — LACTIC ACID, PLASMA
Lactic Acid, Venous: 1.5 mmol/L (ref 0.5–1.9)
Lactic Acid, Venous: 1.2 mmol/L (ref 0.5–1.9)

## 2023-05-28 LAB — PROCALCITONIN: Procalcitonin: 0.45 ng/mL

## 2023-05-28 LAB — MAGNESIUM: Magnesium: 1.1 mg/dL — ABNORMAL LOW (ref 1.7–2.4)

## 2023-05-28 LAB — SARS CORONAVIRUS 2 BY RT PCR: SARS Coronavirus 2 by RT PCR: NEGATIVE

## 2023-05-28 MED ORDER — SODIUM BICARBONATE 8.4 % IV SOLN
INTRAVENOUS | Status: DC
  Filled 2023-05-28: qty 150
  Filled 2023-05-28 (×2): qty 1000
  Filled 2023-05-28 (×2): qty 150
  Filled 2023-05-28: qty 1000

## 2023-05-28 MED ORDER — IOHEXOL 9 MG/ML PO SOLN
500.0000 mL | ORAL | Status: AC
  Administered 2023-05-28: 1000 mL via ORAL

## 2023-05-28 MED ORDER — ONDANSETRON HCL 4 MG/2ML IJ SOLN
4.0000 mg | Freq: Once | INTRAMUSCULAR | Status: AC
  Administered 2023-05-28: 4 mg via INTRAVENOUS
  Filled 2023-05-28: qty 2

## 2023-05-28 MED ORDER — PANTOPRAZOLE SODIUM 40 MG IV SOLR
40.0000 mg | INTRAVENOUS | Status: DC
  Administered 2023-05-28 – 2023-05-31 (×4): 40 mg via INTRAVENOUS
  Filled 2023-05-28 (×4): qty 10

## 2023-05-28 MED ORDER — LOPERAMIDE HCL 2 MG PO CAPS
4.0000 mg | ORAL_CAPSULE | Freq: Two times a day (BID) | ORAL | Status: DC
  Administered 2023-05-28 – 2023-06-01 (×7): 4 mg via ORAL
  Filled 2023-05-28 (×8): qty 2

## 2023-05-28 MED ORDER — ACETAMINOPHEN 325 MG PO TABS: 650.0000 mg | ORAL_TABLET | Freq: Four times a day (QID) | ORAL | Status: DC | PRN

## 2023-05-28 MED ORDER — IOHEXOL 9 MG/ML PO SOLN
ORAL | Status: AC
  Filled 2023-05-28: qty 1000

## 2023-05-28 MED ORDER — ACETAMINOPHEN 650 MG RE SUPP: 650.0000 mg | Freq: Four times a day (QID) | RECTAL | Status: DC | PRN

## 2023-05-28 MED ORDER — SODIUM CHLORIDE 0.9 % IV SOLN
12.5000 mg | Freq: Four times a day (QID) | INTRAVENOUS | Status: DC | PRN
  Administered 2023-05-31: 12.5 mg via INTRAVENOUS
  Filled 2023-05-28: qty 12.5

## 2023-05-28 MED ORDER — ALLOPURINOL 300 MG PO TABS
300.0000 mg | ORAL_TABLET | Freq: Every day | ORAL | Status: DC
  Administered 2023-05-28: 300 mg via ORAL
  Filled 2023-05-28: qty 1

## 2023-05-28 MED ORDER — APIXABAN 5 MG PO TABS
5.0000 mg | ORAL_TABLET | Freq: Two times a day (BID) | ORAL | Status: DC
  Administered 2023-05-28 – 2023-06-01 (×8): 5 mg via ORAL
  Filled 2023-05-28 (×8): qty 1

## 2023-05-28 MED ORDER — MAGNESIUM SULFATE 4 GM/100ML IV SOLN
4.0000 g | Freq: Once | INTRAVENOUS | Status: AC
  Administered 2023-05-28: 4 g via INTRAVENOUS
  Filled 2023-05-28: qty 100

## 2023-05-28 MED ORDER — LACTATED RINGERS IV BOLUS
1000.0000 mL | Freq: Once | INTRAVENOUS | Status: AC
  Administered 2023-05-28: 1000 mL via INTRAVENOUS

## 2023-05-28 MED ORDER — SODIUM CHLORIDE 0.9 % IV SOLN
1.0000 g | Freq: Once | INTRAVENOUS | Status: AC
  Administered 2023-05-28: 1 g via INTRAVENOUS
  Filled 2023-05-28: qty 10

## 2023-05-28 MED ORDER — FOLIC ACID 1 MG PO TABS
1.0000 mg | ORAL_TABLET | Freq: Every day | ORAL | Status: DC
  Administered 2023-05-28 – 2023-06-01 (×5): 1 mg via ORAL
  Filled 2023-05-28 (×5): qty 1

## 2023-05-28 MED ORDER — DULOXETINE HCL 30 MG PO CPEP
120.0000 mg | ORAL_CAPSULE | Freq: Every day | ORAL | Status: DC
  Administered 2023-05-28 – 2023-06-01 (×5): 120 mg via ORAL
  Filled 2023-05-28 (×5): qty 4

## 2023-05-28 MED ORDER — LACTATED RINGERS IV SOLN: INTRAVENOUS | Status: DC

## 2023-05-28 MED ORDER — ONDANSETRON HCL 4 MG PO TABS: 4.0000 mg | ORAL_TABLET | Freq: Four times a day (QID) | ORAL | Status: DC | PRN

## 2023-05-28 MED ORDER — HYDROCODONE-ACETAMINOPHEN 5-325 MG PO TABS: 1.0000 | ORAL_TABLET | ORAL | Status: DC | PRN

## 2023-05-28 MED ORDER — SIMETHICONE 80 MG PO CHEW
120.0000 mg | CHEWABLE_TABLET | Freq: Every day | ORAL | Status: DC
  Administered 2023-05-28 – 2023-05-31 (×4): 120 mg via ORAL
  Filled 2023-05-28 (×4): qty 2

## 2023-05-28 MED ORDER — CHOLESTYRAMINE LIGHT 4 G PO PACK
4.0000 g | PACK | Freq: Three times a day (TID) | ORAL | Status: DC
  Administered 2023-05-28 – 2023-05-29 (×2): 4 g via ORAL
  Filled 2023-05-28 (×2): qty 1

## 2023-05-28 MED ORDER — ONDANSETRON HCL 4 MG/2ML IJ SOLN
4.0000 mg | Freq: Four times a day (QID) | INTRAMUSCULAR | Status: DC | PRN
  Administered 2023-05-30: 4 mg via INTRAVENOUS
  Filled 2023-05-28: qty 2

## 2023-05-28 MED ORDER — SODIUM CHLORIDE 0.9 % IV SOLN
1.0000 g | INTRAVENOUS | Status: DC
  Administered 2023-05-29 – 2023-05-31 (×3): 1 g via INTRAVENOUS
  Filled 2023-05-28 (×3): qty 10

## 2023-05-28 MED ORDER — METOPROLOL SUCCINATE ER 50 MG PO TB24
100.0000 mg | ORAL_TABLET | Freq: Every day | ORAL | Status: DC
  Administered 2023-05-28 – 2023-05-31 (×4): 100 mg via ORAL
  Filled 2023-05-28 (×4): qty 2

## 2023-05-28 NOTE — ED Notes (Signed)
ED TO INPATIENT HANDOFF REPORT  ED Nurse Name and Phone #: Linus Orn Name/Age/Gender [name] 67 y.o. female Room/Bed: WA02/WA02  Code Status   Code Status: Full Code  Home/SNF/Other Home Patient oriented to: self, place, time, and situation Is this baseline? Yes   Triage Complete: Triage complete  Chief Complaint Nausea and vomiting [R11.2]  Triage Note Pt presents with c/o N/V for approx 3 days. Pt reports that she has an ileostomy and has also had an increased amount of gas.   Allergies Allergies  Allergen Reactions   Sulfa Antibiotics Hives, Itching, Rash and Swelling   Morphine Other (See Comments)    "Gives me crazy dreams" (delusions, also) Psychosis, also    Sulfonamide Derivatives Hives, Itching and Swelling    Level of Care/Admitting Diagnosis ED Disposition     ED Disposition  Admit   Condition  --   Comment  Hospital Area: [facility] [[id number]]  Level of Care: Med-Surg [16]  May place patient in observation at [facility] or [facility] if equivalent level of care is available:: Yes  Covid Evaluation: Asymptomatic - no recent exposure (last 10 days) testing not required  Diagnosis: Nausea and vomiting [744752]  Admitting Physician: Andris Baumann [[id number]]  Attending Physician: Andris Baumann [[id number]]          B Medical/Surgery History Past Medical History:  Diagnosis Date   Abdominal pain    Hx   Acute pyelonephritis 12/07/2020   Allergy    Anal stenosis    Anemia    Anxiety    Arthritis    Asthma    patient does not have inhaler   Blood in stool    Hx   Blood in urine    Hx   Blood transfusion without reported diagnosis    Cataract    CKD (chronic kidney disease) stage 3, GFR 30-59 ml/min (HCC)    Crohn's colitis, other complication (HCC)    De Quervain's tenosynovitis    Depression    Difficulty urinating    Hx   Easy bruising    Esophagitis    Fainting    History - resolved - due to  dehydration   Fatigue    Hx   Fibroid    Hx   Gastric polyp    GERD (gastroesophageal reflux disease)    Hearing loss    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   Hemorrhoids, external    Hemorrhoids, internal    Herpes, genital    vaginal treated 07/05/12 and pt states is resolved   History of cervical dysplasia    History of small bowel obstruction    Hyperlipidemia    currently no meds   Hyperparathyroidism    Hypertension    Hypokalemia    Hx   Hypotension 06/29/2022   IBD (inflammatory bowel disease)    initially colectomy for suspected UC, 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   Obesity    Ovarian cyst    Pain in joint, pelvic region and thigh 12/16/2008   Centricity Description: HIP PAIN, LEFT  Qualifier: Diagnosis of   By: Janit Bern      Centricity Description: HIP PAIN  Qualifier: Diagnosis of   By: Janit Bern       Pain of right thumb 04/12/2018   Poor dental hygiene    Pulmonary nodule, right    right  upper lobe   Rectal bleeding    Hx   Rectal pain    Hx   Renal insufficiency    CKD - stage 3   RLS (restless legs syndrome)    no meds   Sepsis secondary to UTI (HCC) 06/29/2022   Severe sepsis (HCC) 12/07/2020   Tooth infection 11/2016   right low   Ulcerative colitis    Visual disturbance    wears glasses   Weakness generalized    Hx - patient denies generalized weakness   Wears dentures    upper only   Past Surgical History:  Procedure Laterality Date   ANAL DILATION     BIOPSY  07/20/2022   Procedure: BIOPSY;  Surgeon: Meryl Dare, MD;  Location: [facility];  Service: Gastroenterology;;   CERVICAL BIOPSY  W/ LOOP ELECTRODE EXCISION     CHOLECYSTECTOMY     COLONOSCOPY     [name]   fatty tumor removed from back     X 2   HEMORRHOID SURGERY     ILEOSCOPY N/A 07/20/2022   Procedure: ILEOSCOPY THROUGH STOMA;  Surgeon: Meryl Dare, MD;  Location: [facility];  Service:  Gastroenterology;  Laterality: N/A;   ILEOSTOMY CLOSURE     IR IMAGING GUIDED PORT INSERTION  04/12/2023   RESTORATIVE PROCTOCOLECTOMY     with insertion of ileoanal J Pouch with loop ileostomy   SHOULDER ARTHROSCOPY WITH CAPSULORRHAPHY Left 06/14/2019   Procedure: LEFT SHOULDER ARTHRSCOPIC REPAIR OF BONY BANKART FRACTURE;  Surgeon: Jones Broom, MD;  Location: [facility];  Service: Orthopedics;  Laterality: Left;  REQUEST 90 MINUTE   SIGMOIDOSCOPY     TOTAL ABDOMINAL HYSTERECTOMY  1998   TAH/LSO   TUBAL LIGATION     UPPER GASTROINTESTINAL ENDOSCOPY     [name]     A IV Location/Drains/Wounds Patient Lines/Drains/Airways Status     Active Line/Drains/Airways     Name Placement date Placement time Site Days   Implanted Port 04/12/23 Right Chest 04/12/23  0919  Chest  46   Peripheral IV 05/28/23 20 G Right Antecubital 05/28/23  1319  Antecubital  less than 1   Ileostomy Standard (end) RLQ 06/30/22  1013  RLQ  332            Intake/Output Last 24 hours  Intake/Output Summary (Last 24 hours) at 05/28/2023 2022 Last data filed at 05/28/2023 1529 Gross per 24 hour  Intake 1000 ml  Output --  Net 1000 ml    Labs/Imaging Results for orders placed or performed during the hospital encounter of 05/28/23 (from the past 48 hour(s))  Lipase, blood     Status: Abnormal   Collection Time: 05/28/23 12:27 PM  Result Value Ref Range   Lipase 114 (H) 11 - 51 U/L    Comment: Performed at [facility], [address] [place], [place] [address]  Comprehensive metabolic panel     Status: Abnormal   Collection Time: 05/28/23 12:27 PM  Result Value Ref Range   Sodium 135 135 - 145 mmol/L   Potassium 3.9 3.5 - 5.1 mmol/L   Chloride 111 98 - 111 mmol/L   CO2 16 (L) 22 - 32 mmol/L   Glucose, Bld 145 (H) 70 - 99 mg/dL    Comment: Glucose reference range applies only to samples taken after fasting for at least 8 hours.   BUN 49 (H) 8 - 23 mg/dL   Creatinine, Ser 6.04 (H)  0.44 - 1.00 mg/dL   Calcium  8.4 (L) 8.9 - 10.3 mg/dL   Total Protein 7.6 6.5 - 8.1 g/dL   Albumin 3.3 (L) 3.5 - 5.0 g/dL   AST 20 15 - 41 U/L   ALT 26 0 - 44 U/L   Alkaline Phosphatase 145 (H) 38 - 126 U/L   Total Bilirubin 0.5 0.3 - 1.2 mg/dL   GFR, Estimated 13 (L) >60 mL/min    Comment: (NOTE) Calculated using the CKD-EPI Creatinine Equation (2021)    Anion gap 8 5 - 15    Comment: Performed at [facility], [address] [place], [place] [address]  CBC     Status: Abnormal   Collection Time: 05/28/23 12:27 PM  Result Value Ref Range   WBC 19.3 (H) 4.0 - 10.5 K/uL   RBC 3.87 3.87 - 5.11 MIL/uL   Hemoglobin 12.8 12.0 - 15.0 g/dL   HCT 60.4 54.0 - 98.1 %   MCV 103.6 (H) 80.0 - 100.0 fL   MCH 33.1 26.0 - 34.0 pg   MCHC 31.9 30.0 - 36.0 g/dL   RDW 19.1 (H) 47.8 - 29.5 %   Platelets 411 (H) 150 - 400 K/uL   nRBC 0.2 0.0 - 0.2 %    Comment: Performed at [facility], [address], [place], [place] [address]  Magnesium     Status: Abnormal   Collection Time: 05/28/23 12:27 PM  Result Value Ref Range   Magnesium 1.1 (L) 1.7 - 2.4 mg/dL    Comment: Performed at [facility], [address] [place], [place] [address]  Urinalysis, w/ Reflex to Culture (Infection Suspected) -Urine, Clean Catch     Status: Abnormal   Collection Time: 05/28/23  5:17 PM  Result Value Ref Range   Specimen Source URINE, CLEAN CATCH    Color, Urine YELLOW YELLOW   APPearance CLOUDY (A) CLEAR   Specific Gravity, Urine 1.016 1.005 - 1.030   pH 5.0 5.0 - 8.0   Glucose, UA 50 (A) NEGATIVE mg/dL   Hgb urine dipstick SMALL (A) NEGATIVE   Bilirubin Urine NEGATIVE NEGATIVE   Ketones, ur NEGATIVE NEGATIVE mg/dL   Protein, ur 469 (A) NEGATIVE mg/dL   Nitrite NEGATIVE NEGATIVE   Leukocytes,Ua LARGE (A) NEGATIVE   RBC / HPF 21-50 0 - 5 RBC/hpf   WBC, UA >50 0 - 5 WBC/hpf    Comment:        Reflex urine culture not performed if WBC <=10, OR if  Squamous epithelial cells >5. If Squamous epithelial cells >5 suggest recollection.    Bacteria, UA RARE (A) NONE SEEN   Squamous Epithelial / HPF 0-5 0 - 5 /HPF   Mucus PRESENT     Comment: Performed at [facility], [address], [place], [place] [address]   *Note: Due to a large number of results and/or encounters for the requested time period, some results have not been displayed. A complete set of results can be found in Results Review.   CT ABDOMEN PELVIS WO CONTRAST  Result Date: 05/28/2023 CLINICAL DATA:  History of Crohn's disease status post total colectomy and ileostomy with 3 days of nausea and vomiting EXAM: CT ABDOMEN AND PELVIS WITHOUT CONTRAST TECHNIQUE: Multidetector CT imaging of the abdomen and pelvis was performed following the standard protocol without IV contrast. RADIATION DOSE REDUCTION: This exam was performed according to the departmental dose-optimization program which includes automated exposure control, adjustment of the mA and/or kV according to patient size and/or use  of iterative reconstruction technique. COMPARISON:  CT abdomen and pelvis dated 01/10/2023 FINDINGS: Lower chest: Partially imaged central venous catheter tip terminates at the superior cavoatrial junction. No focal consolidation or pulmonary nodule in the lung bases. No pleural effusion or pneumothorax demonstrated. Partially imaged heart size is normal. Hepatobiliary: No focal hepatic lesions. No intra or extrahepatic biliary ductal dilation. Cholecystectomy. Pancreas: No focal lesions or main ductal dilation. Spleen: Normal in size without focal abnormality. Adrenals/Urinary Tract: No adrenal nodules. No suspicious renal mass or hydronephrosis. Punctate nonobstructing left upper pole stone. Decompressed urinary bladder. Stomach/Bowel: Small hiatal hernia. Normal appearance of the stomach. Postsurgical changes from colectomy and right upper quadrant ileostomy. Enteric contrast material  is present to the level of the ileostomy. No evidence of bowel wall thickening, distention, or inflammatory changes. Vascular/Lymphatic: Aortic atherosclerosis. No enlarged abdominal or pelvic lymph nodes. Reproductive: Unchanged right adnexal 2.7 cm simple-appearing cyst (2:69). No specific follow-up imaging recommended. No new adnexal masses. Other: No free fluid, fluid collection, or free air. Musculoskeletal: No acute or abnormal lytic or blastic osseous lesions. Multilevel degenerative changes of the partially imaged thoracic and lumbar spine. Old left rib fractures. Postsurgical changes of the anterior abdominal wall. IMPRESSION: 1. No acute abnormality in the abdomen or pelvis. 2. Postsurgical changes from colectomy and right upper quadrant ileostomy. No evidence of bowel obstruction. 3. Small hiatal hernia. 4.  Aortic Atherosclerosis (ICD10-I70.0). Electronically Signed   By: Agustin Cree M.D.   On: 05/28/2023 16:26    Pending Labs Unresulted Labs (From admission, onward)     Start     Ordered   05/29/23 0500  Magnesium  Tomorrow morning,   R        05/28/23 1957   05/29/23 0500  Comprehensive metabolic panel  Tomorrow morning,   R        05/28/23 1957   05/29/23 0500  CBC  Tomorrow morning,   R        05/28/23 1957   05/28/23 1916  Blood culture (routine x 2)  BLOOD CULTURE X 2,   R (with TIMED occurrences)      05/28/23 1915   05/28/23 1916  Procalcitonin  Once,   R       References:    Procalcitonin Lower Respiratory Tract Infection AND Sepsis Procalcitonin Algorithm   05/28/23 1915   05/28/23 1915  SARS Coronavirus 2 by RT PCR (hospital order, performed in [facility] hospital lab) *cepheid single result test* Anterior Nasal Swab  (Tier 2 - SARS Coronavirus 2 by RT PCR (hospital order, performed in [facility] hospital lab) *cepheid single result test*)  Once,   R        05/28/23 1915   05/28/23 1915  Lactic acid, plasma  (Lactic Acid)  Now then every 2 hours,   R (with TIMED occurrences)       05/28/23 1915   05/28/23 1717  Urine Culture  Once,   R        05/28/23 1717            Vitals/Pain Today's Vitals   05/28/23 1445 05/28/23 1741 05/28/23 1745 05/28/23 1800  BP: (!) 162/114 91/71 139/77 (!) 145/98  Pulse:  99 95 99  Resp: (!) 22 18 17 20   Temp:  98.3 F (36.8 C)    TempSrc:  Oral    SpO2: 98% 99% 98% 97%  PainSc:        Isolation Precautions Airborne and Contact precautions  Medications Medications  iohexol (OMNIPAQUE) 9 MG/ML oral solution 500 mL (1,000 mLs Oral Contrast Given 05/28/23 1326)  allopurinol (ZYLOPRIM) tablet 300 mg (has no administration in time range)  cholestyramine light (PREVALITE) packet 4 g (has no administration in time range)  metoprolol succinate (TOPROL-XL) 24 hr tablet 100 mg (has no administration in time range)  DULoxetine (CYMBALTA) DR capsule 120 mg (has no administration in time range)  Simethicone CAPS 125 mg (has no administration in time range)  loperamide (IMODIUM A-D) tablet 4 mg (has no administration in time range)  apixaban (ELIQUIS) tablet 5 mg (has no administration in time range)  folic acid (FOLVITE) tablet 1 mg (has no administration in time range)  pantoprazole (PROTONIX) injection 40 mg (has no administration in time range)  cefTRIAXone (ROCEPHIN) 1 g in sodium chloride 0.9 % 100 mL IVPB (has no administration in time range)  acetaminophen (TYLENOL) tablet 650 mg (has no administration in time range)    Or  acetaminophen (TYLENOL) suppository 650 mg (has no administration in time range)  ondansetron (ZOFRAN) tablet 4 mg (has no administration in time range)    Or  ondansetron (ZOFRAN) injection 4 mg (has no administration in time range)  lactated ringers infusion (has no administration in time range)  HYDROcodone-acetaminophen (NORCO/VICODIN) 5-325 MG per tablet 1-2 tablet (has no administration in time range)  promethazine (PHENERGAN) 12.5 mg in sodium chloride 0.9 % 50 mL IVPB (has no administration  in time range)  lactated ringers bolus 1,000 mL (0 mLs Intravenous Stopped 05/28/23 1529)  ondansetron (ZOFRAN) injection 4 mg (4 mg Intravenous Given 05/28/23 1328)  magnesium sulfate IVPB 4 g 100 mL (0 g Intravenous Stopped 05/28/23 1921)  cefTRIAXone (ROCEPHIN) 1 g in sodium chloride 0.9 % 100 mL IVPB (1 g Intravenous New Bag/Given 05/28/23 1920)    Mobility walks     Focused Assessments Gi assessment   R Recommendations: See Admitting Provider Note  Report given to:   Additional Notes: pt has ostomy. Aaox4, walks.

## 2023-05-28 NOTE — ED Triage Notes (Signed)
Pt presents with c/o N/V for approx 3 days. Pt reports that she has an ileostomy and has also had an increased amount of gas.

## 2023-05-28 NOTE — ED Provider Notes (Signed)
[place] EMERGENCY DEPARTMENT AT [facility] Provider Note   CSN: [id number] Arrival date & time: 05/28/23  1201     History  Chief Complaint  Patient presents with   Nausea   Vomiting    [name] is a 67 y.o. female.  HPI 67 year old female presents with nausea, vomiting, and increased gas/output of her ileostomy.  She has had this before and last time had electrolyte disturbances and had to be admitted.  She states that starting 5 days ago she has had nausea with occasional episodes of spitting up and increased gas and output of her ileostomy.  She denies any blood in the stools.  No significant abdominal pain.  She is afraid to eat and has been eating less because it causes nausea.  She denies fevers, shortness of breath/chest pain.  She states that often she cannot make it to the bathroom in time because she will urinate on herself.  However she thinks has been more of a longstanding issue and denies any specific dysuria.  She has Lomotil but has not taken it.  She has multiple comorbidities including CKD, hypertension, hyperlipidemia, previous ileostomy surgery.  Home Medications Prior to Admission medications   Medication Sig Start Date End Date Taking? Authorizing Provider  acetaminophen (TYLENOL) 500 MG tablet Take 1,000 mg by mouth every 6 (six) hours as needed (for pain/sleep).    [provider]  allopurinol (ZYLOPRIM) 300 MG tablet Take 0.5 tablets (150 mg total) by mouth at bedtime. Patient taking differently: Take 300 mg by mouth at bedtime. 12/10/22   [name], DO  apixaban (ELIQUIS) 5 MG TABS tablet Take 1 tablet (5 mg total) by mouth 2 (two) times daily. 02/03/23 05/28/23  Lorin Glass, MD  Calcium Carbonate Antacid (TUMS EXTRA STRENGTH 750 PO) Take 1-2 tablets by mouth every 6 (six) hours as needed (CHEW for heartburn).    [provider]  cholestyramine light (PREVALITE) 4 g packet TAKE 1 PACKET (4 G TOTAL) BY MOUTH 3  (THREE) TIMES DAILY AFTER MEALS. 02/07/23   Zola Button, Grayling Congress, DO  DULoxetine (CYMBALTA) 60 MG capsule Take 2 capsules (120 mg total) by mouth daily. 02/16/23   [name], DO  folic acid (FOLVITE) 1 MG tablet Take 2 tablets (2 mg total) by mouth daily. Patient taking differently: Take 1 mg by mouth daily. 05/26/17   Josph Macho, MD  GAS-X EXTRA STRENGTH 125 MG CAPS Take 125 mg by mouth at bedtime.    [provider]  loperamide (IMODIUM A-D) 2 MG tablet Take 4 mg by mouth in the morning and at bedtime.    [provider]  metoprolol succinate (TOPROL-XL) 100 MG 24 hr tablet Take 100 mg by mouth at bedtime.    [provider]  OVER THE COUNTER MEDICATION Take 1 tablet by mouth See admin instructions. Nervive Nerve Health tablet- Take 1 tablet by mouth at bedtime    [provider]  Vitamin D, Ergocalciferol, (DRISDOL) 1.25 MG (50000 UNIT) CAPS capsule Take 50,000 Units by mouth every 30 (thirty) days.    [provider]      Allergies    Sulfa antibiotics, Morphine, and Sulfonamide derivatives    Review of Systems   Review of Systems  Constitutional:  Negative for fever.  Gastrointestinal:  Positive for diarrhea, nausea and vomiting. Negative for abdominal pain and constipation.  Genitourinary:  Negative for dysuria.    Physical Exam Updated Vital Signs  BP (!) 145/98   Pulse 99   Temp 98.3 F (36.8 C) (Oral)   Resp 20   SpO2 97%  Physical Exam Vitals and nursing note reviewed.  Constitutional:      General: She is not in acute distress.    Appearance: She is well-developed. She is not ill-appearing or diaphoretic.  HENT:     Head: Normocephalic and atraumatic.  Cardiovascular:     Rate and Rhythm: Normal rate and regular rhythm.     Heart sounds: Normal heart sounds.  Pulmonary:     Effort: Pulmonary effort is normal.     Breath sounds: Normal breath sounds.  Abdominal:     General: There is no distension.      Palpations: Abdomen is soft.     Tenderness: There is no abdominal tenderness.  Skin:    General: Skin is warm and dry.  Neurological:     Mental Status: She is alert.     ED Results / Procedures / Treatments   Labs (all labs ordered are listed, but only abnormal results are displayed) Labs Reviewed  LIPASE, BLOOD - Abnormal; Notable for the following components:      Result Value   Lipase 114 (*)    All other components within normal limits  COMPREHENSIVE METABOLIC PANEL - Abnormal; Notable for the following components:   CO2 16 (*)    Glucose, Bld 145 (*)    BUN 49 (*)    Creatinine, Ser 3.68 (*)    Calcium 8.4 (*)    Albumin 3.3 (*)    Alkaline Phosphatase 145 (*)    GFR, Estimated 13 (*)    All other components within normal limits  CBC - Abnormal; Notable for the following components:   WBC 19.3 (*)    MCV 103.6 (*)    RDW 15.9 (*)    Platelets 411 (*)    All other components within normal limits  URINALYSIS, W/ REFLEX TO CULTURE (INFECTION SUSPECTED) - Abnormal; Notable for the following components:   APPearance CLOUDY (*)    Glucose, UA 50 (*)    Hgb urine dipstick SMALL (*)    Protein, ur 100 (*)    Leukocytes,Ua LARGE (*)    Bacteria, UA RARE (*)    All other components within normal limits  MAGNESIUM - Abnormal; Notable for the following components:   Magnesium 1.1 (*)    All other components within normal limits  URINE CULTURE  SARS CORONAVIRUS 2 BY RT PCR  CULTURE, BLOOD (ROUTINE X 2)  CULTURE, BLOOD (ROUTINE X 2)  LACTIC ACID, PLASMA  LACTIC ACID, PLASMA  PROCALCITONIN  MAGNESIUM  COMPREHENSIVE METABOLIC PANEL  CBC    EKG EKG Interpretation Date/Time:  Saturday May 28 2023 13:15:21 EDT Ventricular Rate:  89 PR Interval:  171 QRS Duration:  109 QT Interval:  368 QTC Calculation: 448 R Axis:   -53  Text Interpretation: Sinus rhythm Left anterior fascicular block Abnormal R-wave progression, early transition Left ventricular  hypertrophy Anterior Q waves, possibly due to LVH no significant change since May 2024 Confirmed by Pricilla Loveless [phone]) on 05/28/2023 3:21:11 PM  Radiology CT ABDOMEN PELVIS WO CONTRAST  Result Date: 05/28/2023 CLINICAL DATA:  History of Crohn's disease status post total colectomy and ileostomy with 3 days of nausea and vomiting EXAM: CT ABDOMEN AND PELVIS WITHOUT CONTRAST TECHNIQUE: Multidetector CT imaging of the abdomen and pelvis was performed following the standard protocol without IV contrast. RADIATION DOSE REDUCTION: This exam was performed  according to the departmental dose-optimization program which includes automated exposure control, adjustment of the mA and/or kV according to patient size and/or use of iterative reconstruction technique. COMPARISON:  CT abdomen and pelvis dated 01/10/2023 FINDINGS: Lower chest: Partially imaged central venous catheter tip terminates at the superior cavoatrial junction. No focal consolidation or pulmonary nodule in the lung bases. No pleural effusion or pneumothorax demonstrated. Partially imaged heart size is normal. Hepatobiliary: No focal hepatic lesions. No intra or extrahepatic biliary ductal dilation. Cholecystectomy. Pancreas: No focal lesions or main ductal dilation. Spleen: Normal in size without focal abnormality. Adrenals/Urinary Tract: No adrenal nodules. No suspicious renal mass or hydronephrosis. Punctate nonobstructing left upper pole stone. Decompressed urinary bladder. Stomach/Bowel: Small hiatal hernia. Normal appearance of the stomach. Postsurgical changes from colectomy and right upper quadrant ileostomy. Enteric contrast material is present to the level of the ileostomy. No evidence of bowel wall thickening, distention, or inflammatory changes. Vascular/Lymphatic: Aortic atherosclerosis. No enlarged abdominal or pelvic lymph nodes. Reproductive: Unchanged right adnexal 2.7 cm simple-appearing cyst (2:69). No specific follow-up imaging  recommended. No new adnexal masses. Other: No free fluid, fluid collection, or free air. Musculoskeletal: No acute or abnormal lytic or blastic osseous lesions. Multilevel degenerative changes of the partially imaged thoracic and lumbar spine. Old left rib fractures. Postsurgical changes of the anterior abdominal wall. IMPRESSION: 1. No acute abnormality in the abdomen or pelvis. 2. Postsurgical changes from colectomy and right upper quadrant ileostomy. No evidence of bowel obstruction. 3. Small hiatal hernia. 4.  Aortic Atherosclerosis (ICD10-I70.0). Electronically Signed   By: Agustin Cree M.D.   On: 05/28/2023 16:26    Procedures Procedures    Medications Ordered in ED Medications  iohexol (OMNIPAQUE) 9 MG/ML oral solution 500 mL (1,000 mLs Oral Contrast Given 05/28/23 1326)  allopurinol (ZYLOPRIM) tablet 300 mg (has no administration in time range)  cholestyramine light (PREVALITE) packet 4 g (has no administration in time range)  metoprolol succinate (TOPROL-XL) 24 hr tablet 100 mg (has no administration in time range)  DULoxetine (CYMBALTA) DR capsule 120 mg (has no administration in time range)  Simethicone CAPS 125 mg (has no administration in time range)  loperamide (IMODIUM A-D) tablet 4 mg (has no administration in time range)  apixaban (ELIQUIS) tablet 5 mg (has no administration in time range)  folic acid (FOLVITE) tablet 1 mg (has no administration in time range)  pantoprazole (PROTONIX) injection 40 mg (has no administration in time range)  cefTRIAXone (ROCEPHIN) 1 g in sodium chloride 0.9 % 100 mL IVPB (has no administration in time range)  acetaminophen (TYLENOL) tablet 650 mg (has no administration in time range)    Or  acetaminophen (TYLENOL) suppository 650 mg (has no administration in time range)  ondansetron (ZOFRAN) tablet 4 mg (has no administration in time range)    Or  ondansetron (ZOFRAN) injection 4 mg (has no administration in time range)  lactated ringers infusion  (has no administration in time range)  HYDROcodone-acetaminophen (NORCO/VICODIN) 5-325 MG per tablet 1-2 tablet (has no administration in time range)  promethazine (PHENERGAN) 12.5 mg in sodium chloride 0.9 % 50 mL IVPB (has no administration in time range)  lactated ringers bolus 1,000 mL (0 mLs Intravenous Stopped 05/28/23 1529)  ondansetron (ZOFRAN) injection 4 mg (4 mg Intravenous Given 05/28/23 1328)  magnesium sulfate IVPB 4 g 100 mL (0 g Intravenous Stopped 05/28/23 1921)  cefTRIAXone (ROCEPHIN) 1 g in sodium chloride 0.9 % 100 mL IVPB (1 g Intravenous New Bag/Given 05/28/23 1920)  ED Course/ Medical Decision Making/ A&P Clinical Course as of 05/28/23 2031  Sat May 28, 2023  1903 Comprehensive metabolic panel(!) [HD]    Clinical Course User Index [HD] Andris Baumann, MD                                 Medical Decision Making Amount and/or Complexity of Data Reviewed Labs: ordered.    Details: Creatinine of 3.68 is slightly worse than a couple weeks ago.  She does have a leukocytosis of 19. Corrected Calcium is 9.0 Radiology: ordered and independent interpretation performed.    Details: No bowel obstruction ECG/medicine tests: ordered and independent interpretation performed.    Details: No ischemia  Risk Prescription drug management. Decision regarding hospitalization.   CT is reassuring.  However her leukocytosis combined with a UTI and vomiting is concerning for pyelonephritis.  Given her age and comorbidities and she still not feeling quite well, I think she should be admitted for IV antibiotics and fluids and supportive care.  Discussed with Dr. Para March for admission.        Final Clinical Impression(s) / ED Diagnoses Final diagnoses:  Acute urinary tract infection    Rx / DC Orders ED Discharge Orders     None         Pricilla Loveless, MD 05/28/23 2032

## 2023-05-28 NOTE — Assessment & Plan Note (Signed)
Repleted in the ED Continue to monitor and replete as needed

## 2023-05-28 NOTE — Assessment & Plan Note (Addendum)
Nausea and vomiting Increased ileostomy output SIRS CT abdomen was nonacute Borderline sepsis criteria with tachycardia, leukocytosis, AKI Follow lactic acid and procalcitonin IV hydration, IV antiemetics, IV Protonix Continue ceftriaxone and follow urine cultures Monitor electrolytes and replete as needed Follow COVID screen Consider GI consult in the a.m.

## 2023-05-28 NOTE — H&P (Addendum)
History and Physical    Patient: [name] NGE:[id number] DOB: 1956-02-06 DOA: 05/28/2023 DOS: the patient was seen and examined on 05/28/2023 PCP: [name], DO  Patient coming from: Home  Chief Complaint:  Chief Complaint  Patient presents with   Nausea   Vomiting    HPI: [name] is a 67 y.o. 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  Patient is followed by GI, Dr. Adela Lank and receives prophylactic outpatient IV infusions (1 L NS bolus q. MWF) as well as monthly IM octreotide.  Her last infusion was the day prior on 9/20.  She complains of suprapubic and left lower quadrant discomfort.  Denies fever.  Denies dysuria, denies cough.  States she is short of breath at baseline and it is no worse. ED course and data review: Mildly tachycardic on arrival with heart rate in the high 90s up to 101 with BP 134/96.  Afebrile. Labs notable for WBC of 19,000, lactic acid not done CMP shows creatinine 3.68 up from baseline of 3.38 about 3 months ago, bicarb 16 which is her baseline anion gap 8.  LFTs WNL, lipase 114 UA with large leukocyte esterase, rare bacteria and cloudy in appearance Magnesium 1.1, potassium 3.9 EKG, personally viewed and interpreted with sinus rhythm of 89, LAFB LVH CT abdomen and pelvis nonacute as follows: IMPRESSION: 1. No acute abnormality in the abdomen or pelvis. 2. Postsurgical changes from colectomy and right upper  quadrant ileostomy. No evidence of bowel obstruction. 3. Small hiatal hernia.  Patient treated with LR, Zofran and given IV magnesium sulfate Hospitalist consulted for admission.   Review of Systems: As mentioned in the history of present illness. All other systems reviewed and are negative.  Past Medical History:  Diagnosis Date   Abdominal pain    Hx   Acute pyelonephritis 12/07/2020   Allergy    Anal stenosis    Anemia    Anxiety    Arthritis    Asthma    patient does not have inhaler   Blood in stool    Hx   Blood in urine    Hx   Blood transfusion without reported diagnosis    Cataract    CKD (chronic kidney disease) stage 3, GFR 30-59 ml/min (HCC)    Crohn's colitis, other complication (HCC)    De Quervain's tenosynovitis    Depression    Difficulty urinating    Hx   Easy bruising    Esophagitis    Fainting    History - resolved - due to dehydration   Fatigue    Hx   Fibroid    Hx   Gastric polyp    GERD (gastroesophageal reflux disease)    Hearing loss    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   Hemorrhoids, external    Hemorrhoids, internal    Herpes, genital    vaginal treated 07/05/12 and pt states is resolved   History of cervical dysplasia    History of  small bowel obstruction    Hyperlipidemia    currently no meds   Hyperparathyroidism    Hypertension    Hypokalemia    Hx   Hypotension 06/29/2022   IBD (inflammatory bowel disease)    initially colectomy for suspected UC, 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   Obesity    Ovarian cyst    Pain in joint, pelvic region and thigh 12/16/2008   Centricity Description: HIP PAIN, LEFT  Qualifier: Diagnosis of   By: Janit Bern      Centricity Description: HIP PAIN  Qualifier: Diagnosis of   By: Janit Bern       Pain of right thumb 04/12/2018   Poor dental hygiene    Pulmonary nodule, right    right upper lobe   Rectal bleeding    Hx    Rectal pain    Hx   Renal insufficiency    CKD - stage 3   RLS (restless legs syndrome)    no meds   Sepsis secondary to UTI (HCC) 06/29/2022   Severe sepsis (HCC) 12/07/2020   Tooth infection 11/2016   right low   Ulcerative colitis    Visual disturbance    wears glasses   Weakness generalized    Hx - patient denies generalized weakness   Wears dentures    upper only   Past Surgical History:  Procedure Laterality Date   ANAL DILATION     BIOPSY  07/20/2022   Procedure: BIOPSY;  Surgeon: Meryl Dare, MD;  Location: [facility];  Service: Gastroenterology;;   CERVICAL BIOPSY  W/ LOOP ELECTRODE EXCISION     CHOLECYSTECTOMY     COLONOSCOPY     [name]   fatty tumor removed from back     X 2   HEMORRHOID SURGERY     ILEOSCOPY N/A 07/20/2022   Procedure: ILEOSCOPY THROUGH STOMA;  Surgeon: Meryl Dare, MD;  Location: [facility];  Service: Gastroenterology;  Laterality: N/A;   ILEOSTOMY CLOSURE     IR IMAGING GUIDED PORT INSERTION  04/12/2023   RESTORATIVE PROCTOCOLECTOMY     with insertion of ileoanal J Pouch with loop ileostomy   SHOULDER ARTHROSCOPY WITH CAPSULORRHAPHY Left 06/14/2019   Procedure: LEFT SHOULDER ARTHRSCOPIC REPAIR OF BONY BANKART FRACTURE;  Surgeon: Jones Broom, MD;  Location: [facility];  Service: Orthopedics;  Laterality: Left;  REQUEST 90 MINUTE   SIGMOIDOSCOPY     TOTAL ABDOMINAL HYSTERECTOMY  1998   TAH/LSO   TUBAL LIGATION     UPPER GASTROINTESTINAL ENDOSCOPY     [name]   Social History:  reports that she quit smoking about 44 years ago. Her smoking use included cigarettes. She started smoking about 48 years ago. She has a 4 pack-year smoking history. She has never used smokeless tobacco. She reports that she does not drink alcohol and does not use drugs.  Allergies  Allergen Reactions   Sulfa Antibiotics Hives, Itching, Rash and Swelling   Morphine Other (See Comments)    "Gives me crazy dreams" (delusions, also) Psychosis, also     Sulfonamide Derivatives Hives, Itching and Swelling    Family History  Problem Relation Age of Onset   Hypertension Mother    Heart disease Mother        s/p pci   Ulcerative colitis Father    Hypertension Father    Heart attack Father    Diabetes Sister    Cancer Sister  uterine   Pulmonary fibrosis Sister    Ulcerative colitis Daughter    Cancer Maternal Uncle        LUNG   Irritable bowel syndrome Other        grandchildren   Colon cancer Neg Hx    Esophageal cancer Neg Hx    Stomach cancer Neg Hx    Rectal cancer Neg Hx     Prior to Admission medications   Medication Sig Start Date End Date Taking? Authorizing Provider  acetaminophen (TYLENOL) 500 MG tablet Take 1,000 mg by mouth every 6 (six) hours as needed (for pain/sleep).    [provider]  allopurinol (ZYLOPRIM) 300 MG tablet Take 0.5 tablets (150 mg total) by mouth at bedtime. Patient taking differently: Take 300 mg by mouth at bedtime. 12/10/22   [name], DO  apixaban (ELIQUIS) 5 MG TABS tablet Take 1 tablet (5 mg total) by mouth 2 (two) times daily. Patient not taking: Reported on 05/13/2023 02/03/23 05/04/23  Lorin Glass, MD  Calcium Carbonate Antacid (TUMS EXTRA STRENGTH 750 PO) Take 1-2 tablets by mouth every 6 (six) hours as needed (CHEW for heartburn).    [provider]  cholestyramine light (PREVALITE) 4 g packet TAKE 1 PACKET (4 G TOTAL) BY MOUTH 3 (THREE) TIMES DAILY AFTER MEALS. 02/07/23   Zola Button, Grayling Congress, DO  DULoxetine (CYMBALTA) 60 MG capsule Take 2 capsules (120 mg total) by mouth daily. 02/16/23   [name], DO  folic acid (FOLVITE) 1 MG tablet Take 2 tablets (2 mg total) by mouth daily. Patient taking differently: Take 1 mg by mouth daily. 05/26/17   Josph Macho, MD  GAS-X EXTRA STRENGTH 125 MG CAPS Take 125 mg by mouth at bedtime.    [provider]  loperamide (IMODIUM A-D) 2 MG tablet Take 4 mg by mouth in the morning and at bedtime.     [provider]  metoprolol succinate (TOPROL-XL) 100 MG 24 hr tablet Take 100 mg by mouth at bedtime.    [provider]  OVER THE COUNTER MEDICATION Take 1 tablet by mouth See admin instructions. Nervive Nerve Health tablet- Take 1 tablet by mouth at bedtime    [provider]  Vitamin D, Ergocalciferol, (DRISDOL) 1.25 MG (50000 UNIT) CAPS capsule Take 50,000 Units by mouth every 30 (thirty) days.    [provider]    Physical Exam: Vitals:   05/28/23 1445 05/28/23 1741 05/28/23 1745 05/28/23 1800  BP: (!) 162/114 91/71 139/77 (!) 145/98  Pulse:  99 95 99  Resp: (!) 22 18 17 20   Temp:  98.3 F (36.8 C)    TempSrc:  Oral    SpO2: 98% 99% 98% 97%   Physical Exam Vitals and nursing note reviewed.  Constitutional:      General: She is not in acute distress.    Appearance: She is ill-appearing.  HENT:     Head: Normocephalic and atraumatic.  Cardiovascular:     Rate and Rhythm: Normal rate and regular rhythm.     Heart sounds: Normal heart sounds.  Pulmonary:     Effort: Pulmonary effort is normal.     Breath sounds: Normal breath sounds.  Abdominal:     Palpations: Abdomen is soft.     Tenderness: There is abdominal tenderness in the suprapubic area and left lower quadrant.     Comments: Ileostomy present right upper quadrant, filled with green thin liquid and puffed with gas  Neurological:     Mental Status: Mental status is at baseline.     Labs on Admission: I have personally reviewed following labs and imaging studies  CBC: Recent Labs  Lab 05/28/23 1227  WBC 19.3*  HGB 12.8  HCT 40.1  MCV 103.6*  PLT 411*   Basic Metabolic Panel: Recent Labs  Lab 05/28/23 1227  NA 135  K 3.9  CL 111  CO2 16*  GLUCOSE 145*  BUN 49*  CREATININE 3.68*  CALCIUM 8.4*  MG 1.1*   GFR: Estimated Creatinine Clearance: 15 mL/min (A) (by C-G formula based on SCr of 3.68 mg/dL (H)). Liver Function Tests: Recent Labs  Lab 05/28/23 1227   AST 20  ALT 26  ALKPHOS 145*  BILITOT 0.5  PROT 7.6  ALBUMIN 3.3*   Recent Labs  Lab 05/28/23 1227  LIPASE 114*   No results for input(s): "AMMONIA" in the last 168 hours. Coagulation Profile: No results for input(s): "INR", "PROTIME" in the last 168 hours. Cardiac Enzymes: No results for input(s): "CKTOTAL", "CKMB", "CKMBINDEX", "TROPONINI" in the last 168 hours. BNP (last 3 results) No results for input(s): "PROBNP" in the last 8760 hours. HbA1C: No results for input(s): "HGBA1C" in the last 72 hours. CBG: No results for input(s): "GLUCAP" in the last 168 hours. Lipid Profile: No results for input(s): "CHOL", "HDL", "LDLCALC", "TRIG", "CHOLHDL", "LDLDIRECT" in the last 72 hours. Thyroid Function Tests: No results for input(s): "TSH", "T4TOTAL", "FREET4", "T3FREE", "THYROIDAB" in the last 72 hours. Anemia Panel: No results for input(s): "VITAMINB12", "FOLATE", "FERRITIN", "TIBC", "IRON", "RETICCTPCT" in the last 72 hours. Urine analysis:    Component Value Date/Time   COLORURINE YELLOW 05/28/2023 1717   APPEARANCEUR CLOUDY (A) 05/28/2023 1717   LABSPEC 1.016 05/28/2023 1717   PHURINE 5.0 05/28/2023 1717   GLUCOSEU 50 (A) 05/28/2023 1717   GLUCOSEU NEGATIVE 03/14/2009 1603   HGBUR SMALL (A) 05/28/2023 1717   BILIRUBINUR NEGATIVE 05/28/2023 1717   BILIRUBINUR negative 03/05/2022 1121   KETONESUR NEGATIVE 05/28/2023 1717   PROTEINUR 100 (A) 05/28/2023 1717   UROBILINOGEN 0.2 03/05/2022 1121   UROBILINOGEN 0.2 04/12/2013 0900   NITRITE NEGATIVE 05/28/2023 1717   LEUKOCYTESUR LARGE (A) 05/28/2023 1717    Radiological Exams on Admission: CT ABDOMEN PELVIS WO CONTRAST  Result Date: 05/28/2023 CLINICAL DATA:  History of Crohn's disease status post total colectomy and ileostomy with 3 days of nausea and vomiting EXAM: CT ABDOMEN AND PELVIS WITHOUT CONTRAST TECHNIQUE: Multidetector CT imaging of the abdomen and pelvis was performed following the standard protocol without  IV contrast. RADIATION DOSE REDUCTION: This exam was performed according to the departmental dose-optimization program which includes automated exposure control, adjustment of the mA and/or kV according to patient size and/or use of iterative reconstruction technique. COMPARISON:  CT abdomen and pelvis dated 01/10/2023 FINDINGS: Lower chest: Partially imaged central venous catheter tip terminates at the superior cavoatrial junction. No focal consolidation or pulmonary nodule in the lung bases. No pleural effusion or pneumothorax demonstrated. Partially imaged heart size is normal. Hepatobiliary: No focal hepatic lesions. No intra or extrahepatic biliary ductal dilation. Cholecystectomy. Pancreas: No focal lesions or main ductal dilation. Spleen: Normal in size without focal abnormality. Adrenals/Urinary Tract: No adrenal nodules. No suspicious renal mass or hydronephrosis. Punctate nonobstructing left upper pole stone. Decompressed urinary bladder. Stomach/Bowel: Small hiatal hernia. Normal appearance of the stomach. Postsurgical changes from colectomy and right upper quadrant ileostomy. Enteric contrast material is present to the level of the ileostomy. No evidence of bowel wall  thickening, distention, or inflammatory changes. Vascular/Lymphatic: Aortic atherosclerosis. No enlarged abdominal or pelvic lymph nodes. Reproductive: Unchanged right adnexal 2.7 cm simple-appearing cyst (2:69). No specific follow-up imaging recommended. No new adnexal masses. Other: No free fluid, fluid collection, or free air. Musculoskeletal: No acute or abnormal lytic or blastic osseous lesions. Multilevel degenerative changes of the partially imaged thoracic and lumbar spine. Old left rib fractures. Postsurgical changes of the anterior abdominal wall. IMPRESSION: 1. No acute abnormality in the abdomen or pelvis. 2. Postsurgical changes from colectomy and right upper quadrant ileostomy. No evidence of bowel obstruction. 3. Small hiatal  hernia. 4.  Aortic Atherosclerosis (ICD10-I70.0). Electronically Signed   By: Agustin Cree M.D.   On: 05/28/2023 16:26     Data Reviewed: Relevant notes from primary care and specialist visits, past discharge summaries as available in EHR, including Care Everywhere. Prior diagnostic testing as pertinent to current admission diagnoses Updated medications and problem lists for reconciliation ED course, including vitals, labs, imaging, treatment and response to treatment Triage notes, nursing and pharmacy notes and ED provider's notes Notable results as noted in HPI   Assessment and Plan: Urinary tract infection Nausea and vomiting Increased ileostomy output SIRS CT abdomen was nonacute Borderline sepsis criteria with tachycardia, leukocytosis, AKI Follow lactic acid and procalcitonin IV hydration, IV antiemetics, IV Protonix Continue ceftriaxone and follow urine cultures Monitor electrolytes and replete as needed Follow COVID screen Consider GI consult in the a.m.  Hypomagnesemia Repleted in the ED Continue to monitor and replete as needed  Acute kidney injury superimposed on chronic kidney disease V (HCC) Chronic metabolic acidosis, possible acute component IV hydration with bicarb and monitor renal function  High output ileostomy (HCC) IBD s/p total colectomy and end ileostomy Replace fluids and electrolyte losses Continue colestyramine  Chronic anticoagulation History of VTE Continue Eliquis  Essential hypertension Continue metoprolol  Anxiety state Continue duloxetine        DVT prophylaxis: Eliquis  Consults: GI  Advance Care Planning:   Code Status: Prior   Family Communication: none  Disposition Plan: Back to previous home environment  Severity of Illness: The appropriate patient status for this patient is OBSERVATION. Observation status is judged to be reasonable and necessary in order to provide the required intensity of service to ensure the  patient's safety. The patient's presenting symptoms, physical exam findings, and initial radiographic and laboratory data in the context of their medical condition is felt to place them at decreased risk for further clinical deterioration. Furthermore, it is anticipated that the patient will be medically stable for discharge from the hospital within 2 midnights of admission.   Author: Andris Baumann, MD 05/28/2023 7:13 PM  For on call review [email or web address].

## 2023-05-28 NOTE — Assessment & Plan Note (Addendum)
Chronic metabolic acidosis, possible acute component IV hydration with bicarb and monitor renal function

## 2023-05-28 NOTE — Assessment & Plan Note (Addendum)
IBD s/p total colectomy and end ileostomy Replace fluids and electrolyte losses Continue colestyramine

## 2023-05-28 NOTE — Assessment & Plan Note (Signed)
Continue metoprolol.

## 2023-05-28 NOTE — Assessment & Plan Note (Signed)
Continue duloxetine

## 2023-05-28 NOTE — Assessment & Plan Note (Signed)
History of VTE Continue Eliquis

## 2023-05-29 ENCOUNTER — Other Ambulatory Visit: Payer: Self-pay

## 2023-05-29 DIAGNOSIS — N189 Chronic kidney disease, unspecified: Secondary | ICD-10-CM | POA: Diagnosis not present

## 2023-05-29 DIAGNOSIS — I10 Essential (primary) hypertension: Secondary | ICD-10-CM

## 2023-05-29 DIAGNOSIS — Z932 Ileostomy status: Secondary | ICD-10-CM

## 2023-05-29 DIAGNOSIS — R112 Nausea with vomiting, unspecified: Secondary | ICD-10-CM | POA: Diagnosis not present

## 2023-05-29 DIAGNOSIS — G2581 Restless legs syndrome: Secondary | ICD-10-CM | POA: Diagnosis present

## 2023-05-29 DIAGNOSIS — N390 Urinary tract infection, site not specified: Secondary | ICD-10-CM | POA: Diagnosis not present

## 2023-05-29 DIAGNOSIS — R109 Unspecified abdominal pain: Secondary | ICD-10-CM | POA: Diagnosis not present

## 2023-05-29 DIAGNOSIS — A419 Sepsis, unspecified organism: Secondary | ICD-10-CM | POA: Diagnosis not present

## 2023-05-29 DIAGNOSIS — K529 Noninfective gastroenteritis and colitis, unspecified: Secondary | ICD-10-CM | POA: Diagnosis not present

## 2023-05-29 DIAGNOSIS — E8722 Chronic metabolic acidosis: Secondary | ICD-10-CM | POA: Diagnosis not present

## 2023-05-29 DIAGNOSIS — J45909 Unspecified asthma, uncomplicated: Secondary | ICD-10-CM | POA: Diagnosis present

## 2023-05-29 DIAGNOSIS — E860 Dehydration: Secondary | ICD-10-CM | POA: Diagnosis present

## 2023-05-29 DIAGNOSIS — K219 Gastro-esophageal reflux disease without esophagitis: Secondary | ICD-10-CM | POA: Diagnosis present

## 2023-05-29 DIAGNOSIS — N179 Acute kidney failure, unspecified: Secondary | ICD-10-CM | POA: Diagnosis not present

## 2023-05-29 DIAGNOSIS — F411 Generalized anxiety disorder: Secondary | ICD-10-CM | POA: Diagnosis present

## 2023-05-29 DIAGNOSIS — Z6833 Body mass index (BMI) 33.0-33.9, adult: Secondary | ICD-10-CM | POA: Diagnosis not present

## 2023-05-29 DIAGNOSIS — E871 Hypo-osmolality and hyponatremia: Secondary | ICD-10-CM | POA: Diagnosis present

## 2023-05-29 DIAGNOSIS — I120 Hypertensive chronic kidney disease with stage 5 chronic kidney disease or end stage renal disease: Secondary | ICD-10-CM | POA: Diagnosis present

## 2023-05-29 DIAGNOSIS — Z1152 Encounter for screening for COVID-19: Secondary | ICD-10-CM | POA: Diagnosis not present

## 2023-05-29 DIAGNOSIS — D638 Anemia in other chronic diseases classified elsewhere: Secondary | ICD-10-CM | POA: Diagnosis present

## 2023-05-29 DIAGNOSIS — I700 Atherosclerosis of aorta: Secondary | ICD-10-CM | POA: Diagnosis present

## 2023-05-29 DIAGNOSIS — N185 Chronic kidney disease, stage 5: Secondary | ICD-10-CM | POA: Diagnosis present

## 2023-05-29 DIAGNOSIS — R198 Other specified symptoms and signs involving the digestive system and abdomen: Secondary | ICD-10-CM

## 2023-05-29 DIAGNOSIS — F32A Depression, unspecified: Secondary | ICD-10-CM | POA: Diagnosis present

## 2023-05-29 DIAGNOSIS — D539 Nutritional anemia, unspecified: Secondary | ICD-10-CM | POA: Diagnosis present

## 2023-05-29 DIAGNOSIS — E876 Hypokalemia: Secondary | ICD-10-CM | POA: Diagnosis present

## 2023-05-29 DIAGNOSIS — E669 Obesity, unspecified: Secondary | ICD-10-CM | POA: Diagnosis present

## 2023-05-29 DIAGNOSIS — R9389 Abnormal findings on diagnostic imaging of other specified body structures: Secondary | ICD-10-CM | POA: Diagnosis not present

## 2023-05-29 DIAGNOSIS — T82828A Fibrosis of vascular prosthetic devices, implants and grafts, initial encounter: Secondary | ICD-10-CM | POA: Diagnosis not present

## 2023-05-29 DIAGNOSIS — E8809 Other disorders of plasma-protein metabolism, not elsewhere classified: Secondary | ICD-10-CM | POA: Diagnosis present

## 2023-05-29 DIAGNOSIS — E785 Hyperlipidemia, unspecified: Secondary | ICD-10-CM | POA: Diagnosis present

## 2023-05-29 LAB — COMPREHENSIVE METABOLIC PANEL
ALT: 19 U/L (ref 0–44)
AST: 16 U/L (ref 15–41)
Albumin: 2.6 g/dL — ABNORMAL LOW (ref 3.5–5.0)
Alkaline Phosphatase: 117 U/L (ref 38–126)
Anion gap: 13 (ref 5–15)
BUN: 50 mg/dL — ABNORMAL HIGH (ref 8–23)
CO2: 17 mmol/L — ABNORMAL LOW (ref 22–32)
Calcium: 8.4 mg/dL — ABNORMAL LOW (ref 8.9–10.3)
Chloride: 104 mmol/L (ref 98–111)
Creatinine, Ser: 3.38 mg/dL — ABNORMAL HIGH (ref 0.44–1.00)
GFR, Estimated: 14 mL/min — ABNORMAL LOW (ref >60)
Glucose, Bld: 159 mg/dL — ABNORMAL HIGH (ref 70–99)
Potassium: 3 mmol/L — ABNORMAL LOW (ref 3.5–5.1)
Sodium: 134 mmol/L — ABNORMAL LOW (ref 135–145)
Total Bilirubin: 0.6 mg/dL (ref 0.3–1.2)
Total Protein: 6.3 g/dL — ABNORMAL LOW (ref 6.5–8.1)

## 2023-05-29 LAB — C DIFFICILE QUICK SCREEN W PCR REFLEX
C Diff antigen: NEGATIVE
C Diff interpretation: NOT DETECTED
C Diff toxin: NEGATIVE

## 2023-05-29 LAB — URINE CULTURE

## 2023-05-29 LAB — MAGNESIUM: Magnesium: 1.9 mg/dL (ref 1.7–2.4)

## 2023-05-29 LAB — CBC
HCT: 33.6 % — ABNORMAL LOW (ref 36.0–46.0)
Hemoglobin: 11 g/dL — ABNORMAL LOW (ref 12.0–15.0)
MCH: 33.6 pg (ref 26.0–34.0)
MCHC: 32.7 g/dL (ref 30.0–36.0)
MCV: 102.8 fL — ABNORMAL HIGH (ref 80.0–100.0)
Platelets: 317 10*3/uL (ref 150–400)
RBC: 3.27 MIL/uL — ABNORMAL LOW (ref 3.87–5.11)
RDW: 15.4 % (ref 11.5–15.5)
WBC: 16.5 10*3/uL — ABNORMAL HIGH (ref 4.0–10.5)
nRBC: 0 % (ref 0.0–0.2)

## 2023-05-29 MED ORDER — ALLOPURINOL 300 MG PO TABS
150.0000 mg | ORAL_TABLET | Freq: Every day | ORAL | Status: DC
  Administered 2023-05-29 – 2023-05-31 (×3): 150 mg via ORAL
  Filled 2023-05-29 (×4): qty 1

## 2023-05-29 MED ORDER — CHOLESTYRAMINE LIGHT 4 G PO PACK
4.0000 g | PACK | Freq: Three times a day (TID) | ORAL | Status: DC
  Administered 2023-05-29 – 2023-06-01 (×9): 4 g via ORAL
  Filled 2023-05-29 (×10): qty 1

## 2023-05-29 MED ORDER — POTASSIUM CHLORIDE CRYS ER 20 MEQ PO TBCR
40.0000 meq | EXTENDED_RELEASE_TABLET | Freq: Two times a day (BID) | ORAL | Status: AC
  Administered 2023-05-29 – 2023-05-30 (×2): 40 meq via ORAL
  Filled 2023-05-29 (×2): qty 2

## 2023-05-29 NOTE — Hospital Course (Signed)
HPI per Dr. Lindajo Royal on 05/28/23 [name] is a 67 y.o. 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  Patient is followed by GI, Dr. Adela Lank and receives prophylactic outpatient IV infusions (1 L NS bolus q. MWF) as well as monthly IM octreotide.  Her last infusion was the day prior on 9/20.  She complains of suprapubic and left lower quadrant discomfort.    **Interim History Being treated for Suspected UTI and Continues to have very high output from her ileostomy but thinks is improving now.  Nausea was slowly improving however she had episode of nausea vomiting last night of unclear etiology several hours after started dry heaving.  Will need to continue antiemetics and continue to see if she is able to tolerate oral intake.  IV fluid hydration has now been reduced to half and changed to normal saline.  If she continues to have some nausea monitoring may warrant a repeat CT scan or discussion with the specialist including GI or General Surgery.   Assessment and Plan:  Suspected urinary Tract Infection Nausea and Vomiting had improved but now reoccurred yesterday Increased and High ileostomy output Sepsis 2/2 to UTI, improving  -CT abdomen was nonacute and showed " No acute abnormality in the abdomen or pelvis. Postsurgical changes from colectomy and right upper quadrant ileostomy. No evidence of bowel obstruction. Small hiatal hernia. Aortic  Atherosclerosis." -Sepsis criteria with tachycardia, leukocytosis, AKI and Source of Suspected Infection with UTI -Lactic Acid Level Trend and PCT was 0.45 Recent Labs  Lab 05/28/23 2119 05/28/23 2130  LATICACIDVEN 1.5 1.2  -WBC Trend: Recent Labs  Lab 05/13/23 1453 05/28/23 1227 05/29/23 0642 05/30/23 0516 05/31/23 0549  WBC 12.6* 19.3* 16.5* 12.5* 12.8*  -Follow lactic acid and Procalcitonin -IV Hydration being continued with a bicarbonate drip but this was changed to normal saline at 75 mL/h, IV Antiemetics, IV PPI -Continue IV Ceftriaxone and follow urine cultures and showed Multiple Species Present so will repeat -Patient was on a regular diet but vomited last night so we will need to monitor carefully -GI Pathogen Panel and C Diff Negative  -Monitor electrolytes and replete as needed -COVID Screen Negative  -Will get KUB this afternoon -Consider GI or General Surgery evaluation if not improving further and she had episode of nausea vomiting last night of unclear etiology 4 hours after eating her meal   Hypomagnesemia -Patient's Mag Level Trend: Recent Labs  Lab 05/28/23 1227 05/29/23 0642 05/30/23 0516 05/31/23 0549  MG 1.1* 1.9 1.8 2.0  -Continue to Monitor and Replete as Necessary -Repeat Mag in the AM   Hypokalemia -Patient's K+ Level Trend: Recent Labs  Lab 05/13/23 1453 05/28/23 1227 05/29/23 0642 05/30/23 0516 05/31/23 0549  K 3.4* 3.9 3.0* 3.3* 3.2*  -Replete with po Kcl 40 mEQ BID x2 again -Continue to Monitor and Replete as Necessary -Repeat CMP in the AM   Hyponatremia -Improved. Na+ Trend: Recent Labs  Lab 05/13/23 1453 05/28/23 1227 05/29/23 0642 05/30/23 0516 05/31/23 0549  NA 139 135 134*  133* 139  -Continue to Monitor and adjusting Fluids as above -Repeat CMP in the AM   High output ileostomy (HCC), improving slowly  IBD s/p total colectomy and end ileostomy -Replace fluids and electrolyte losses and c/w Sodium Bicarbonate 125  mL/hr -Continue Cholestyramine 4 grams po TID -Check GI Pathogen Panel and C Difficile in both negative -C/w Loperamide 4 mg po BID for now -Nursing was not measuring appropriately but I spoke with the nurse today to measure how much output she had and the goal is to have less than 1200 cc a day   Chronic Anticoagulation History of VTE -Continue Anticoagulation with Apixaban 5 mg po BID   Essential Hypertension -Continue to Monitor BP per Ptotocol -Continue Metoprolol Succinate 100 mg po qHS -Last BP reading was 127/84   Deppresion and Anxiety state -C/w Duloxetine 120 mg po Daily  AKI on CKD Stage V, improving  slowly  Chronic metabolic acidosis, possible acute component -IV hydration with Sodium Bicarbonate now changed to IV NS at 75 mL/hr  -BUN/Cr Trend: Recent Labs  Lab 05/13/23 1453 05/28/23 1227 05/29/23 0642 05/30/23 0516 05/31/23 0549  BUN 47* 49* 50* 36* 25*  CREATININE 3.34* 3.68* 3.38* 3.21* 2.76*  -Avoid Nephrotoxic Medications, Contrast Dyes, Hypotension and Dehydration to Ensure Adequate Renal Perfusion and will need to Renally Adjust Meds -Continue to Monitor and Trend Renal Function carefully and repeat CMP in the AM   Macrocytic Anemia/Anemia of Chronic Disease -Hgb/Hct Trend: Recent Labs  Lab 05/13/23 1453 05/28/23 1227 05/29/23 0642 05/30/23 0516 05/31/23 0549  HGB 10.8* 12.8 11.0* 9.8* 9.6*  HCT 33.3* 40.1 33.6* 29.7* 29.2*  MCV 103.1* 103.6* 102.8* 102.1* 101.7*  -Checked Anemia Panel and showed an iron level of 45, UIBC of 256, TIBC 3 1, saturation ratios of 15%, ferritin 92, folate level 35.5 and vitamin B12 level of 399 -Continue to Monitor for S/Sx of Bleeding; No overt bleeding noted -Repeat CBC in the AM   GERD/GI Prophylaxis -C/w IV PPI with Pantoprazole 40 mg po Daily   Hypoalbuminemia -Patient's Albumin Trend: Recent Labs  Lab 05/13/23 1453 05/28/23 1227 05/29/23 0642 05/30/23 0516 05/31/23 0549  ALBUMIN 4.1 3.3* 2.6* 2.5*  2.7*  -Continue to Monitor and Trend and repeat CMP in the AM  Obesity -Complicates overall prognosis and care -Estimated body mass index is 33.48 kg/m as calculated from the following:   Height as of 05/27/23: 5\' 3"  (1.6 m).   Weight as of 05/27/23: 85.7 kg.  -Weight Loss and Dietary Counseling given

## 2023-05-29 NOTE — Progress Notes (Signed)
PROGRESS NOTE    [name]  QIO:[id number] DOB: 08-24-1956 DOA: 05/28/2023 PCP: [name], DO   Brief Narrative:  HPI per Dr. Lindajo Royal on 05/28/23  [name] is a 67 y.o. 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  Patient is followed by GI, Dr. Adela Lank and receives prophylactic outpatient IV infusions (1 L NS bolus q. MWF) as well as monthly IM octreotide.  Her last infusion was the day prior on 9/20.  She complains of suprapubic and left lower quadrant discomfort.  Denies fever.  Denies dysuria, denies cough.  States she is short of breath at baseline and it is no worse. ED course and data review: Mildly tachycardic on arrival with heart rate in the high 90s up to 101 with BP 134/96.  Afebrile. Labs notable for WBC of 19,000, lactic acid not done CMP shows creatinine 3.68 up from baseline of 3.38 about 3 months ago, bicarb 16 which is her baseline anion gap 8.  LFTs WNL, lipase 114 UA with large leukocyte esterase, rare bacteria and cloudy in appearance Magnesium 1.1, potassium 3.9 EKG, personally viewed and interpreted with sinus rhythm of 89, LAFB LVH CT abdomen and pelvis nonacute as follows: IMPRESSION: 1. No acute abnormality in the abdomen or pelvis. 2. Postsurgical changes from colectomy and right upper quadrant ileostomy. No evidence of bowel obstruction. 3. Small hiatal hernia.   Patient treated with LR, Zofran and given IV  magnesium sulfate Hospitalist consulted for admission.   **Interim History Continues to have very high output from her ileostomy but thinks is improving a little bit.  Nausea is improving slowly 2.  No chest pain or shortness breath.  No other concerns or complaints at this time.  Assessment and Plan:  Urinary Tract Infection Nausea and Vomiting Increased ileostomy output Sepsis 2/2 to UTI -CT abdomen was nonacute and showed " No acute abnormality in the abdomen or pelvis. Postsurgical changes from colectomy and right upper quadrant ileostomy. No evidence of bowel obstruction. Small hiatal hernia. Aortic Atherosclerosis." -Sepsis criteria with tachycardia, leukocytosis, AKI and Source of Suspected Infection with UTI -Lactic Acid Level Trend and PCT was 0.45 Recent Labs  Lab 05/28/23 2119 05/28/23 2130  LATICACIDVEN 1.5 1.2  -WBC Trend: Recent Labs  Lab 05/13/23 1453 05/28/23 1227 05/29/23 0642  WBC 12.6* 19.3* 16.5*  -Follow lactic acid and Procalcitonin -C/w IV Hydration, IV Antiemetics, IV PPI -Continue IV Ceftriaxone and follow urine cultures and showed Multiple Species Present -Monitor electrolytes and replete as needed -COVID Screen Negative  -Consider GI or General Surgery evaluation if not improving further   Hypomagnesemia -Patient's Mag Level Trend: Recent Labs  Lab 05/28/23 1227 05/29/23 0642  MG 1.1* 1.9  -Continue to Monitor and Replete as Necessary -Repeat Mag in the AM   Hypokalemia -Patient's K+ Level Trend: Recent Labs  Lab 05/13/23 1453 05/28/23 1227 05/29/23 0642  K 3.4* 3.9 3.0*  -Replete with po Kcl 40 mEQ BID x2 -Continue to Monitor and Replete as Necessary -Repeat CMP  in the AM   High output ileostomy (HCC) IBD s/p total colectomy and end ileostomy -Replace fluids and electrolyte losses and c/w Sodium Bicarbonate 125 mL/hr -Continue Cholestyramine 4 grams po TID -Check GI Pathogen Panel and C Difficile  -C/w Loperamide 4 mg po BID  for now   Chronic Anticoagulation History of VTE -Continue Anticoagulation with Apixaban 5 mg po BID   Essential Hypertension -Continue to Monitor BP per Ptotocol -Continue Metoprolol Succinate 100 mg po qHS -Last BP reading was 146/83   Deppresion and Anxiety state -C/w Duloxetine 120 mg po Daily  AKI on CKD Stage V Chronic metabolic acidosis, possible acute component -C/w IV hydration with bicarb  -BUN/Cr Trend: Recent Labs  Lab 05/13/23 1453 05/28/23 1227 05/29/23 0642  BUN 47* 49* 50*  CREATININE 3.34* 3.68* 3.38*  -Avoid Nephrotoxic Medications, Contrast Dyes, Hypotension and Dehydration to Ensure Adequate Renal Perfusion and will need to Renally Adjust Meds -Continue to Monitor and Trend Renal Function carefully and repeat CMP in the AM   Macrocytic Anemia/Anemia of Chronic Disease -Hgb/Hct Trend: Recent Labs  Lab 05/13/23 1453 05/28/23 1227 05/29/23 0642  HGB 10.8* 12.8 11.0*  HCT 33.3* 40.1 33.6*  MCV 103.1* 103.6* 102.8*  -Check Anemia Panel in the AM -Continue to Monitor for S/Sx of Bleeding; No overt bleeding noted -Repeat CBC in the AM   GERD/GI Prophylaxis -C/w IV PPI with Pantoprazole 40 mg po Daily   Hypoalbuminemia -Patient's Albumin Trend: Recent Labs  Lab 05/13/23 1453 05/28/23 1227 05/29/23 0642  ALBUMIN 4.1 3.3* 2.6*  -Continue to Monitor and Trend and repeat CMP in the AM  Obesity -Complicates overall prognosis and care -Estimated body mass index is 33.48 kg/m as calculated from the following:   Height as of 05/27/23: 5\' 3"  (1.6 m).   Weight as of 05/27/23: 85.7 kg.  -Weight Loss and Dietary Counseling given   DVT prophylaxis:  apixaban (ELIQUIS) tablet 5 mg    Code Status: Full Code Family Communication: No family present at bedside   Disposition Plan:  Level of care: Med-Surg Status is: Inpatient Remains inpatient appropriate because: Needs further clinical improvement and decreased ostomy output    Consultants:   None  Procedures:  As delineated as above  Antimicrobials:  Anti-infectives (From admission, onward)    Start     Dose/Rate Route Frequency Ordered Stop   05/29/23 1900  cefTRIAXone (ROCEPHIN) 1 g in sodium chloride 0.9 % 100 mL IVPB        1 g 200 mL/hr over 30 Minutes Intravenous Every 24 hours 05/28/23 1957     05/28/23 1845  cefTRIAXone (ROCEPHIN) 1 g in sodium chloride 0.9 % 100 mL IVPB        1 g 200 mL/hr over 30 Minutes Intravenous  Once 05/28/23 1834 05/28/23 1950       Subjective: Seen and Examined at bedside, still having some abdominal discomfort but thinks her ostomy output is improving.  No nausea or vomiting today.  Feels okay.  Continues to have quite a bit of gas in her ostomy bag.  No other concerns requested this time and denies any chest pain.  Objective: Vitals:   05/29/23 0519 05/29/23 0845 05/29/23 1303 05/29/23 1659  BP: 112/76 122/84 (!) 151/85 (!) 146/83  Pulse: 87 86 87 89  Resp: 18 (!) 22 20 (!) 22  Temp: 97.6 F (36.4 C) 97.7 F (36.5 C) 98.1 F (36.7 C) 98 F (36.7 C)  TempSrc: Oral Oral Oral Oral  SpO2: 97%  98% 98% 99%    Intake/Output Summary (Last 24 hours) at 05/29/2023 1836 Last data filed at 05/29/2023 1055 Gross per 24 hour  Intake 1661.48 ml  Output --  Net 1661.48 ml   There were no vitals filed for this visit.  Examination: Physical Exam:  Constitutional: WN/WD obese chronically ill-appearing Caucasian female no acute distress Respiratory: Diminished to auscultation bilaterally with coarse breath sounds, no wheezing, rales, rhonchi or crackles. Normal respiratory effort and patient is not tachypenic. No accessory muscle use.  Unlabored breathing Cardiovascular: RRR, no murmurs / rubs / gallops. S1 and S2 auscultated. No extremity edema. Abdomen: Soft, a little tender and distended secondary to body habitus.  Ostomy output noted and is green and liquidy. Bowel sounds positive.  GU: Deferred. Musculoskeletal: No clubbing /  cyanosis of digits/nails. No joint deformity upper and lower extremities.  Skin: No rashes, lesions, ulcers on on a limited skin evaluation. No induration; Warm and dry.  Neurologic: CN 2-12 grossly intact with no focal deficits. Romberg sign and cerebellar reflexes not assessed.  Psychiatric: Normal judgment and insight. Alert and oriented x 3. Normal mood and appropriate affect.   Data Reviewed: I have personally reviewed following labs and imaging studies  CBC: Recent Labs  Lab 05/28/23 1227 05/29/23 0642  WBC 19.3* 16.5*  HGB 12.8 11.0*  HCT 40.1 33.6*  MCV 103.6* 102.8*  PLT 411* 317   Basic Metabolic Panel: Recent Labs  Lab 05/28/23 1227 05/29/23 0642  NA 135 134*  K 3.9 3.0*  CL 111 104  CO2 16* 17*  GLUCOSE 145* 159*  BUN 49* 50*  CREATININE 3.68* 3.38*  CALCIUM 8.4* 8.4*  MG 1.1* 1.9   GFR: Estimated Creatinine Clearance: 16.3 mL/min (A) (by C-G formula based on SCr of 3.38 mg/dL (H)). Liver Function Tests: Recent Labs  Lab 05/28/23 1227 05/29/23 0642  AST 20 16  ALT 26 19  ALKPHOS 145* 117  BILITOT 0.5 0.6  PROT 7.6 6.3*  ALBUMIN 3.3* 2.6*   Recent Labs  Lab 05/28/23 1227  LIPASE 114*   No results for input(s): "AMMONIA" in the last 168 hours. Coagulation Profile: No results for input(s): "INR", "PROTIME" in the last 168 hours. Cardiac Enzymes: No results for input(s): "CKTOTAL", "CKMB", "CKMBINDEX", "TROPONINI" in the last 168 hours. BNP (last 3 results) No results for input(s): "PROBNP" in the last 8760 hours. HbA1C: No results for input(s): "HGBA1C" in the last 72 hours. CBG: No results for input(s): "GLUCAP" in the last 168 hours. Lipid Profile: No results for input(s): "CHOL", "HDL", "LDLCALC", "TRIG", "CHOLHDL", "LDLDIRECT" in the last 72 hours. Thyroid Function Tests: No results for input(s): "TSH", "T4TOTAL", "FREET4", "T3FREE", "THYROIDAB" in the last 72 hours. Anemia Panel: No results for input(s): "VITAMINB12", "FOLATE",  "FERRITIN", "TIBC", "IRON", "RETICCTPCT" in the last 72 hours. Sepsis Labs: Recent Labs  Lab 05/28/23 2119 05/28/23 2130  PROCALCITON 0.45  --   LATICACIDVEN 1.5 1.2    Recent Results (from the past 240 hour(s))  Urine Culture     Status: Abnormal   Collection Time: 05/28/23  5:17 PM   Specimen: Urine, Random  Result Value Ref Range Status   Specimen Description   Final    URINE, RANDOM Performed at [facility], [address] [place], [place] [address]    Special Requests   Final    NONE Reflexed from [id number] Performed at [facility], [address], [place], [place] [address]    Culture MULTIPLE SPECIES PRESENT, SUGGEST  RECOLLECTION (A)  Final   Report Status 05/29/2023 FINAL  Final  SARS Coronavirus 2 by RT PCR (hospital order, performed in [facility] hospital lab) *cepheid single result test* Anterior Nasal Swab     Status: None   Collection Time: 05/28/23  7:15 PM   Specimen: Anterior Nasal Swab  Result Value Ref Range Status   SARS Coronavirus 2 by RT PCR NEGATIVE NEGATIVE Final    Comment: (NOTE) SARS-CoV-2 target nucleic acids are NOT DETECTED.  The SARS-CoV-2 RNA is generally detectable in upper and lower respiratory specimens during the acute phase of infection. The lowest concentration of SARS-CoV-2 viral copies this assay can detect is 250 copies / mL. A negative result does not preclude SARS-CoV-2 infection and should not be used as the sole basis for treatment or other patient management decisions.  A negative result may occur with improper specimen collection / handling, submission of specimen other than nasopharyngeal swab, presence of viral mutation(s) within the areas targeted by this assay, and inadequate number of viral copies (<250 copies / mL). A negative result must be combined with clinical observations, patient history, and epidemiological information.  Fact Sheet for Patients:    RoadLapTop.co.za  Fact Sheet for Healthcare Providers: http://kim-miller.com/  This test is not yet approved or  cleared by the [place] FDA and has been authorized for detection and/or diagnosis of SARS-CoV-2 by FDA under an Emergency Use Authorization (EUA).  This EUA will remain in effect (meaning this test can be used) for the duration of the COVID-19 declaration under Section 564(b)(1) of the Act, 21 U.S.C. section 360bbb-3(b)(1), unless the authorization is terminated or revoked sooner.  Performed at [facility], [address] [place], [place] [address]   Blood culture (routine x 2)     Status: None (Preliminary result)   Collection Time: 05/28/23  7:21 PM   Specimen: BLOOD  Result Value Ref Range Status   Specimen Description   Final    BLOOD LEFT ANTECUBITAL Performed at [facility], [address] [place], [place] [address]    Special Requests   Final    BOTTLES DRAWN AEROBIC AND ANAEROBIC Blood Culture adequate volume Performed at [facility], [address], [place], [place] [address]    Culture PENDING  Incomplete   Report Status PENDING  Incomplete  Blood culture (routine x 2)     Status: None (Preliminary result)   Collection Time: 05/28/23  7:50 PM   Specimen: BLOOD  Result Value Ref Range Status   Specimen Description   Final    BLOOD RIGHT ANTECUBITAL Performed at [facility], [address] [place], [place] [address]    Special Requests   Final    BOTTLES DRAWN AEROBIC AND ANAEROBIC Blood Culture adequate volume Performed at [facility], [address] [place], [place] [address]    Culture PENDING  Incomplete   Report Status PENDING  Incomplete    Radiology Studies: CT ABDOMEN PELVIS WO CONTRAST  Result Date: 05/28/2023 CLINICAL DATA:  History of Crohn's disease status post total colectomy and ileostomy with 3 days of  nausea and vomiting EXAM: CT ABDOMEN AND PELVIS WITHOUT CONTRAST TECHNIQUE: Multidetector CT imaging of the abdomen and pelvis was performed following the standard protocol without IV contrast. RADIATION DOSE REDUCTION: This exam was performed according to the departmental dose-optimization program which includes automated exposure control, adjustment of the mA and/or kV according to patient size and/or use of iterative  reconstruction technique. COMPARISON:  CT abdomen and pelvis dated 01/10/2023 FINDINGS: Lower chest: Partially imaged central venous catheter tip terminates at the superior cavoatrial junction. No focal consolidation or pulmonary nodule in the lung bases. No pleural effusion or pneumothorax demonstrated. Partially imaged heart size is normal. Hepatobiliary: No focal hepatic lesions. No intra or extrahepatic biliary ductal dilation. Cholecystectomy. Pancreas: No focal lesions or main ductal dilation. Spleen: Normal in size without focal abnormality. Adrenals/Urinary Tract: No adrenal nodules. No suspicious renal mass or hydronephrosis. Punctate nonobstructing left upper pole stone. Decompressed urinary bladder. Stomach/Bowel: Small hiatal hernia. Normal appearance of the stomach. Postsurgical changes from colectomy and right upper quadrant ileostomy. Enteric contrast material is present to the level of the ileostomy. No evidence of bowel wall thickening, distention, or inflammatory changes. Vascular/Lymphatic: Aortic atherosclerosis. No enlarged abdominal or pelvic lymph nodes. Reproductive: Unchanged right adnexal 2.7 cm simple-appearing cyst (2:69). No specific follow-up imaging recommended. No new adnexal masses. Other: No free fluid, fluid collection, or free air. Musculoskeletal: No acute or abnormal lytic or blastic osseous lesions. Multilevel degenerative changes of the partially imaged thoracic and lumbar spine. Old left rib fractures. Postsurgical changes of the anterior abdominal wall.  IMPRESSION: 1. No acute abnormality in the abdomen or pelvis. 2. Postsurgical changes from colectomy and right upper quadrant ileostomy. No evidence of bowel obstruction. 3. Small hiatal hernia. 4.  Aortic Atherosclerosis (ICD10-I70.0). Electronically Signed   By: Agustin Cree M.D.   On: 05/28/2023 16:26    Scheduled Meds:  allopurinol  150 mg Oral QHS   apixaban  5 mg Oral BID   cholestyramine light  4 g Oral TID PC   DULoxetine  120 mg Oral Daily   folic acid  1 mg Oral Daily   loperamide  4 mg Oral BID   metoprolol succinate  100 mg Oral QHS   pantoprazole (PROTONIX) IV  40 mg Intravenous Q24H   potassium chloride  40 mEq Oral BID   simethicone  120 mg Oral QHS   Continuous Infusions:  cefTRIAXone (ROCEPHIN)  IV     promethazine (PHENERGAN) injection (IM or IVPB)     sodium bicarbonate 150 mEq in dextrose 5 % 1,150 mL infusion 125 mL/hr at 05/28/23 2225    LOS: 0 days   Marguerita Merles, DO [facility] Available via Epic secure chat 7am-7pm After these hours, please refer to coverage provider listed on [email or web address] 05/29/2023, 6:36 PM

## 2023-05-30 ENCOUNTER — Inpatient Hospital Stay (HOSPITAL_COMMUNITY): Payer: Medicare Other

## 2023-05-30 ENCOUNTER — Telehealth (HOSPITAL_COMMUNITY): Payer: Self-pay

## 2023-05-30 DIAGNOSIS — E8722 Chronic metabolic acidosis: Secondary | ICD-10-CM | POA: Diagnosis not present

## 2023-05-30 DIAGNOSIS — N179 Acute kidney failure, unspecified: Secondary | ICD-10-CM | POA: Diagnosis not present

## 2023-05-30 DIAGNOSIS — R112 Nausea with vomiting, unspecified: Secondary | ICD-10-CM | POA: Diagnosis not present

## 2023-05-30 DIAGNOSIS — N390 Urinary tract infection, site not specified: Secondary | ICD-10-CM | POA: Diagnosis not present

## 2023-05-30 HISTORY — PX: IR CV LINE INJECTION: IMG2294

## 2023-05-30 LAB — COMPREHENSIVE METABOLIC PANEL
ALT: 18 U/L (ref 0–44)
AST: 16 U/L (ref 15–41)
Albumin: 2.5 g/dL — ABNORMAL LOW (ref 3.5–5.0)
Alkaline Phosphatase: 109 U/L (ref 38–126)
Anion gap: 13 (ref 5–15)
BUN: 36 mg/dL — ABNORMAL HIGH (ref 8–23)
CO2: 24 mmol/L (ref 22–32)
Calcium: 7.9 mg/dL — ABNORMAL LOW (ref 8.9–10.3)
Chloride: 96 mmol/L — ABNORMAL LOW (ref 98–111)
Creatinine, Ser: 3.21 mg/dL — ABNORMAL HIGH (ref 0.44–1.00)
GFR, Estimated: 15 mL/min — ABNORMAL LOW (ref >60)
Glucose, Bld: 148 mg/dL — ABNORMAL HIGH (ref 70–99)
Potassium: 3.3 mmol/L — ABNORMAL LOW (ref 3.5–5.1)
Sodium: 133 mmol/L — ABNORMAL LOW (ref 135–145)
Total Bilirubin: 0.3 mg/dL (ref 0.3–1.2)
Total Protein: 6 g/dL — ABNORMAL LOW (ref 6.5–8.1)

## 2023-05-30 LAB — FOLATE: Folate: 35.5 ng/mL (ref >5.9)

## 2023-05-30 LAB — ROCKY MTN SPOTTED FVR ABS PNL(IGG+IGM)
RMSF IgG: NOT DETECTED
RMSF IgM: NOT DETECTED

## 2023-05-30 LAB — CBC WITH DIFFERENTIAL/PLATELET
Abs Immature Granulocytes: 0.22 10*3/uL — ABNORMAL HIGH (ref 0.00–0.07)
Basophils Absolute: 0 10*3/uL (ref 0.0–0.1)
Basophils Relative: 0 %
Eosinophils Absolute: 1.5 10*3/uL — ABNORMAL HIGH (ref 0.0–0.5)
Eosinophils Relative: 12 %
HCT: 29.7 % — ABNORMAL LOW (ref 36.0–46.0)
Hemoglobin: 9.8 g/dL — ABNORMAL LOW (ref 12.0–15.0)
Immature Granulocytes: 2 %
Lymphocytes Relative: 16 %
Lymphs Abs: 2 10*3/uL (ref 0.7–4.0)
MCH: 33.7 pg (ref 26.0–34.0)
MCHC: 33 g/dL (ref 30.0–36.0)
MCV: 102.1 fL — ABNORMAL HIGH (ref 80.0–100.0)
Monocytes Absolute: 0.7 10*3/uL (ref 0.1–1.0)
Monocytes Relative: 6 %
Neutro Abs: 7.9 10*3/uL — ABNORMAL HIGH (ref 1.7–7.7)
Neutrophils Relative %: 64 %
Platelets: 290 10*3/uL (ref 150–400)
RBC: 2.91 MIL/uL — ABNORMAL LOW (ref 3.87–5.11)
RDW: 15.2 % (ref 11.5–15.5)
WBC: 12.5 10*3/uL — ABNORMAL HIGH (ref 4.0–10.5)
nRBC: 0.2 % (ref 0.0–0.2)

## 2023-05-30 LAB — RETICULOCYTES
Immature Retic Fract: 24.3 % — ABNORMAL HIGH (ref 2.3–15.9)
RBC.: 2.91 MIL/uL — ABNORMAL LOW (ref 3.87–5.11)
Retic Count, Absolute: 81.5 10*3/uL (ref 19.0–186.0)
Retic Ct Pct: 2.8 % (ref 0.4–3.1)

## 2023-05-30 LAB — GASTROINTESTINAL PANEL BY PCR, STOOL (REPLACES STOOL CULTURE)

## 2023-05-30 LAB — FERRITIN: Ferritin: 92 ng/mL (ref 11–307)

## 2023-05-30 LAB — IRON AND TIBC
Iron: 45 ug/dL (ref 28–170)
Saturation Ratios: 15 % (ref 10.4–31.8)
TIBC: 301 ug/dL (ref 250–450)
UIBC: 256 ug/dL

## 2023-05-30 LAB — PHOSPHORUS: Phosphorus: 4 mg/dL (ref 2.5–4.6)

## 2023-05-30 LAB — VITAMIN B12: Vitamin B-12: 399 pg/mL (ref 180–914)

## 2023-05-30 LAB — MAGNESIUM: Magnesium: 1.8 mg/dL (ref 1.7–2.4)

## 2023-05-30 LAB — B. BURGDORFI ANTIBODIES: B burgdorferi Ab IgG+IgM: <0.9 index

## 2023-05-30 MED ORDER — HEPARIN SOD (PORK) LOCK FLUSH 100 UNIT/ML IV SOLN
INTRAVENOUS | Status: AC
  Filled 2023-05-30: qty 5

## 2023-05-30 MED ORDER — SODIUM CHLORIDE 0.9 % IV BOLUS
1000.0000 mL | Freq: Once | INTRAVENOUS | Status: DC
  Filled 2023-05-30: qty 1000

## 2023-05-30 MED ORDER — POTASSIUM CHLORIDE CRYS ER 20 MEQ PO TBCR
40.0000 meq | EXTENDED_RELEASE_TABLET | Freq: Two times a day (BID) | ORAL | Status: DC
  Administered 2023-05-30: 40 meq via ORAL
  Filled 2023-05-30: qty 2

## 2023-05-30 MED ORDER — MAGNESIUM SULFATE 2 GM/50ML IV SOLN
2.0000 g | Freq: Once | INTRAVENOUS | Status: AC
  Administered 2023-05-30: 2 g via INTRAVENOUS
  Filled 2023-05-30: qty 50

## 2023-05-30 MED ORDER — IOHEXOL 300 MG/ML  SOLN
5.0000 mL | Freq: Once | INTRAMUSCULAR | Status: AC | PRN
  Administered 2023-05-30: 5 mL via INTRAVENOUS

## 2023-05-30 NOTE — Evaluation (Signed)
Occupational Therapy Evaluation and Discharge Patient Details Name: [name] MRN: [id number] DOB: 1956-03-02 Today's Date: 05/30/2023   History of Present Illness 67 yo female presents to therapy following hospital admission on 05/28/2023 due to abdominal pain, N and V with increased ileostomy output. CT of abdomen negative for any acute findings. Pt dx with sepsis secondary to UTI. Pt has PMH including but not limited to: Crohn's dz s/p total colectomy and end ileostomy, CKD with chronic metabolic acidosis, anxiety, depression, GERD, HOH-L, HTN, hypotension, hyperthyroidism, IBD, and RUL pulmonary nodule.   Clinical Impression   Pt admitted for the above diagnosis and overall appears to be at baseline. Pt does not need or want OT services at this time.  Will sign off.       If plan is discharge home, recommend the following: Other (comment) (no assist needed)    Functional Status Assessment  Patient has not had a recent decline in their functional status  Equipment Recommendations  None recommended by OT    Recommendations for Other Services       Precautions / Restrictions Precautions Precautions: Fall Restrictions Weight Bearing Restrictions: No      Mobility Bed Mobility Overal bed mobility: Modified Independent                  Transfers Overall transfer level: Modified independent Equipment used: None               General transfer comment: no assist needed. Commode was elevated      Balance Overall balance assessment: History of Falls (feel coming out of IV fluid treatment one time. Spoke to pt about making sure she stands for a moment before walking to let BP normalize after getting fluids.)                                         ADL either performed or assessed with clinical judgement   ADL Overall ADL's : Independent;At baseline                                       General ADL Comments: Pt is at  baseline with adls, walks to bathroom with IV, dresses self, bathes self.     Vision Baseline Vision/History: 0 No visual deficits Ability to See in Adequate Light: 0 Adequate Patient Visual Report: No change from baseline Vision Assessment?: No apparent visual deficits     Perception Perception: Within Functional Limits       Praxis Praxis: WFL       Pertinent Vitals/Pain Pain Assessment Pain Assessment: Faces Faces Pain Scale: Hurts a little bit Pain Location: pt reports abdominal pain has resolved Pain Descriptors / Indicators: Aching Pain Intervention(s): Monitored during session     Extremity/Trunk Assessment Upper Extremity Assessment Upper Extremity Assessment: Overall WFL for tasks assessed   Lower Extremity Assessment Lower Extremity Assessment: Defer to PT evaluation   Cervical / Trunk Assessment Cervical / Trunk Assessment: Back Surgery   Communication Communication Communication: No apparent difficulties;Hearing impairment   Cognition Arousal: Alert Behavior During Therapy: WFL for tasks assessed/performed Overall Cognitive Status: Within Functional Limits for tasks assessed  General Comments  Pt appeafs to be independent and not in need of OT servcies.    Exercises     Shoulder Instructions      Home Living Family/patient expects to be discharged to:: Private residence Living Arrangements: Spouse/significant other Available Help at Discharge: Family Type of Home: House Home Access: Stairs to enter Secretary/administrator of Steps: 3 Entrance Stairs-Rails: Left Home Layout: One level     Bathroom Shower/Tub: Tub/shower unit;Walk-in shower   Bathroom Toilet: Standard     Home Equipment: Agricultural consultant (2 wheels);Cane - single point (uses 5 gallon bucket in tub to sit on to bathe. Doesnt want tub chair)   Additional Comments: Spoke with pt about using bucket to sit on in shower. Pt feels  this is best option and has it on a rubber mat to ensure ti does not slip.      Prior Functioning/Environment Prior Level of Function : Independent/Modified Independent;Driving             Mobility Comments: IND no AD with all ADLs, self care tasks, IADLs ADLs Comments: showers when husband is home        OT Problem List:        OT Treatment/Interventions:      OT Goals(Current goals can be found in the care plan section) Acute Rehab OT Goals Patient Stated Goal: to go home OT Goal Formulation: All assessment and education complete, DC therapy  OT Frequency:      Co-evaluation              AM-PAC OT "6 Clicks" Daily Activity     Outcome Measure Help from another person eating meals?: None Help from another person taking care of personal grooming?: None Help from another person toileting, which includes using toliet, bedpan, or urinal?: None Help from another person bathing (including washing, rinsing, drying)?: None Help from another person to put on and taking off regular upper body clothing?: None Help from another person to put on and taking off regular lower body clothing?: None 6 Click Score: 24   End of Session Nurse Communication: Mobility status  Activity Tolerance: Patient tolerated treatment well Patient left: in bed;with call bell/phone within reach  OT Visit Diagnosis: Unsteadiness on feet (R26.81)                Time: 1140-1150 OT Time Calculation (min): 10 min Charges:  OT General Charges $OT Visit: 1 Visit OT Evaluation $OT Eval Low Complexity: 1 Low  Hope Budds 05/30/2023, 11:57 AM

## 2023-05-30 NOTE — Telephone Encounter (Signed)
Pt called bc she is in the hospital at [facility]. She wants to know if her port injection can be done while she is admitted. Sent a message to [facility] and per Jeananne Rama, PA, they will try and get to her this afternoon. I called pt back and let her know. I have canceled appt at [facility] for tomorrow and will reschedule if they are unable to get to it. AB

## 2023-05-30 NOTE — Progress Notes (Signed)
Patient educated on fall prevention measures. Pt verbalized to this RN that she will not be compliant with the fall prevention measures (calling before she gets out of bed). Pt educated on reason for safety measures. Pt verbalized understanding but maintains she will not comply. Bed alarm on, yellow socks on. CN and pts nurse notified.

## 2023-05-30 NOTE — Evaluation (Signed)
Physical Therapy Evaluation Only  Patient Details Name: [name] MRN: [id number] DOB: September 29, 1955 Today's Date: 05/30/2023  History of Present Illness  67 yo female presents to therapy following hospital admission on 05/28/2023 due to abdominal pain, N and V with increased ileostomy output. CT of abdomen negative for any acute findings. Pt dx with sepsis secondary to UTI. Pt has PMH including but not limited to: Crohn's dz s/p total colectomy and end ileostomy, CKD with chronic metabolic acidosis, anxiety, depression, GERD, HOH-L, HTN, hypotension, hyperthyroidism, IBD, and RUL pulmonary nodule.  Clinical Impression    Patient evaluated by Physical Therapy with no further acute PT needs identified. All education has been completed and the patient has no further questions.  See below for any follow-up Physical Therapy or DME needs. PT is signing off. Thank you for this referral.        If plan is discharge home, recommend the following: A little help with walking and/or transfers;A little help with bathing/dressing/bathroom;Assistance with cooking/housework;Help with stairs or ramp for entrance;Assist for transportation   Can travel by private vehicle        Equipment Recommendations None recommended by PT  Recommendations for Other Services       Functional Status Assessment Patient has had a recent decline in their functional status and demonstrates the ability to make significant improvements in function in a reasonable and predictable amount of time.     Precautions / Restrictions Precautions Precautions: Fall Restrictions Weight Bearing Restrictions: No      Mobility  Bed Mobility Overal bed mobility: Modified Independent                  Transfers Overall transfer level: Modified independent Equipment used:  (IV pole)               General transfer comment: bed and commode transfers mod I    Ambulation/Gait Ambulation/Gait assistance: Modified  independent (Device/Increase time) Gait Distance (Feet): 15 Feet Assistive device: IV Pole Gait Pattern/deviations: Step-through pattern Gait velocity: decreased     General Gait Details: pt declined gait trials in hallway with PT and indicated she would not like to participate with mobility specialist either  Stairs            Wheelchair Mobility     Tilt Bed    Modified Rankin (Stroke Patients Only)       Balance Overall balance assessment: History of Falls (1 fall past 6 months following infusion)                                           Pertinent Vitals/Pain Pain Assessment Pain Assessment: 0-10 Pain Score: 0-No pain Pain Location: pt reports abdominal pain has resolved    Home Living Family/patient expects to be discharged to:: Private residence Living Arrangements: Spouse/significant other Available Help at Discharge: Family Type of Home: House Home Access: Stairs to enter Entrance Stairs-Rails: Left Entrance Stairs-Number of Steps: 3   Home Layout: One level Home Equipment: None      Prior Function Prior Level of Function : Independent/Modified Independent;Driving             Mobility Comments: IND no AD with all ADLs, self care tasks, IADLs       Extremity/Trunk Assessment        Lower Extremity Assessment Lower Extremity Assessment: Overall WFL for tasks assessed (B  LE peripherial neuropathy)    Cervical / Trunk Assessment Cervical / Trunk Assessment: Back Surgery  Communication   Communication Communication: No apparent difficulties;Hearing impairment  Cognition Arousal: Alert Behavior During Therapy: WFL for tasks assessed/performed Overall Cognitive Status: Within Functional Limits for tasks assessed                                          General Comments      Exercises     Assessment/Plan    PT Assessment Patient does not need any further PT services  PT Problem List          PT Treatment Interventions      PT Goals (Current goals can be found in the Care Plan section)  Acute Rehab PT Goals Patient Stated Goal: to be able to do what I normally do PT Goal Formulation: With patient Time For Goal Achievement: 06/13/23 Potential to Achieve Goals: Good    Frequency       Co-evaluation               AM-PAC PT "6 Clicks" Mobility  Outcome Measure Help needed turning from your back to your side while in a flat bed without using bedrails?: None Help needed moving from lying on your back to sitting on the side of a flat bed without using bedrails?: None Help needed moving to and from a bed to a chair (including a wheelchair)?: None Help needed standing up from a chair using your arms (e.g., wheelchair or bedside chair)?: None Help needed to walk in hospital room?: None Help needed climbing 3-5 steps with a railing? : A Little 6 Click Score: 23    End of Session Equipment Utilized During Treatment: Gait belt Activity Tolerance: Patient tolerated treatment well Patient left: in bed;with call bell/phone within reach Nurse Communication: Mobility status PT Visit Diagnosis: History of falling (Z91.81);Difficulty in walking, not elsewhere classified (R26.2)    Time: 1610-9604 PT Time Calculation (min) (ACUTE ONLY): 18 min   Charges:   PT Evaluation $PT Eval Low Complexity: 1 Low   PT General Charges $$ ACUTE PT VISIT: 1 Visit        Johnny Bridge, PT Acute Rehab   Jacqualyn Posey 05/30/2023, 11:37 AM

## 2023-05-30 NOTE — Progress Notes (Signed)
   05/30/23 1551  TOC Brief Assessment  Insurance and Status Reviewed  Patient has primary care physician Yes  Home environment has been reviewed Home  Prior level of function: Modified independent  Prior/Current Home Services No current home services  Social Determinants of Health Reivew SDOH reviewed no interventions necessary  Readmission risk has been reviewed Yes  Transition of care needs no transition of care needs at this time

## 2023-05-30 NOTE — Progress Notes (Signed)
PROGRESS NOTE    [name]  WUJ:[id number] DOB: June 26, 1956 DOA: 05/28/2023 PCP: [name], DO   Brief Narrative:  HPI per Dr. Lindajo Royal on 05/28/23  [name] is a 67 y.o. 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  Patient is followed by GI, Dr. Adela Lank and receives prophylactic outpatient IV infusions (1 L NS bolus q. MWF) as well as monthly IM octreotide.  Her last infusion was the day prior on 9/20.  She complains of suprapubic and left lower quadrant discomfort.  Denies fever.  Denies dysuria, denies cough.  States she is short of breath at baseline and it is no worse. ED course and data review: Mildly tachycardic on arrival with heart rate in the high 90s up to 101 with BP 134/96.  Afebrile. Labs notable for WBC of 19,000, lactic acid not done CMP shows creatinine 3.68 up from baseline of 3.38 about 3 months ago, bicarb 16 which is her baseline anion gap 8.  LFTs WNL, lipase 114 UA with large leukocyte esterase, rare bacteria and cloudy in appearance Magnesium 1.1, potassium 3.9 EKG, personally viewed and interpreted with sinus rhythm of 89, LAFB LVH CT abdomen and pelvis nonacute as follows: IMPRESSION: 1. No acute abnormality in the abdomen or pelvis. 2. Postsurgical changes from colectomy and right upper quadrant ileostomy. No evidence of bowel obstruction. 3. Small hiatal hernia.   Patient treated with LR, Zofran and given IV  magnesium sulfate Hospitalist consulted for admission.   **Interim History Continues to have very high output from her ileostomy but thinks is improving a little bit.  Nausea is improving slowly 2.  No chest pain or shortness breath.  No other concerns or complaints at this time.  Assessment and Plan:  Urinary Tract Infection Nausea and Vomiting Increased and High ileostomy output Sepsis 2/2 to UTI, improving  -CT abdomen was nonacute and showed " No acute abnormality in the abdomen or pelvis. Postsurgical changes from colectomy and right upper quadrant ileostomy. No evidence of bowel obstruction. Small hiatal hernia. Aortic Atherosclerosis." -Sepsis criteria with tachycardia, leukocytosis, AKI and Source of Suspected Infection with UTI -Lactic Acid Level Trend and PCT was 0.45 Recent Labs  Lab 05/28/23 2119 05/28/23 2130  LATICACIDVEN 1.5 1.2  -WBC Trend: Recent Labs  Lab 05/13/23 1453 05/28/23 1227 05/29/23 0642 05/30/23 0516  WBC 12.6* 19.3* 16.5* 12.5*  -Follow lactic acid and Procalcitonin -C/w IV Hydration, IV Antiemetics, IV PPI -Continue IV Ceftriaxone and follow urine cultures and showed Multiple Species Present so will repeat -GI Pathogen Panel and C Diff Negative  -Monitor electrolytes and replete as needed -COVID Screen Negative  -Consider GI or General Surgery evaluation if not improving further   Hypomagnesemia -Patient's Mag Level Trend: Recent Labs  Lab 05/28/23 1227 05/29/23 0642 05/30/23 0516  MG 1.1* 1.9 1.8  -Continue to Monitor and Replete as Necessary -Repeat Mag in the AM   Hypokalemia -Patient's K+ Level Trend: Recent Labs  Lab 05/13/23 1453 05/28/23 1227 05/29/23 9562 05/30/23 0516  K 3.4* 3.9 3.0* 3.3*  -Replete with po Kcl 40 mEQ BID x2 -Continue to Monitor and Replete as Necessary -Repeat CMP in the AM   High output ileostomy (HCC) IBD s/p total colectomy and end ileostomy -Replace fluids and electrolyte losses and c/w Sodium  Bicarbonate 125 mL/hr -Continue Cholestyramine 4 grams po TID -Check GI Pathogen Panel and C Difficile in both negative -C/w Loperamide 4 mg po BID for now -Has not allowed [place] to measure however will now allow [place] to measure how much output she is having   Chronic Anticoagulation History of VTE -Continue Anticoagulation with Apixaban 5 mg po BID   Essential Hypertension -Continue to Monitor BP per Ptotocol -Continue Metoprolol Succinate 100 mg po qHS -Last BP reading was 127/84   Deppresion and Anxiety state -C/w Duloxetine 120 mg po Daily  AKI on CKD Stage V Chronic metabolic acidosis, possible acute component -C/w IV hydration with bicarb  -BUN/Cr Trend: Recent Labs  Lab 05/13/23 1453 05/28/23 1227 05/29/23 0642 05/30/23 0516  BUN 47* 49* 50* 36*  CREATININE 3.34* 3.68* 3.38* 3.21*  -Avoid Nephrotoxic Medications, Contrast Dyes, Hypotension and Dehydration to Ensure Adequate Renal Perfusion and will need to Renally Adjust Meds -Continue to Monitor and Trend Renal Function carefully and repeat CMP in the AM   Macrocytic Anemia/Anemia of Chronic Disease -Hgb/Hct Trend: Recent Labs  Lab 05/13/23 1453 05/28/23 1227 05/29/23 0642 05/30/23 0516  HGB 10.8* 12.8 11.0* 9.8*  HCT 33.3* 40.1 33.6* 29.7*  MCV 103.1* 103.6* 102.8* 102.1*  -Checked Anemia Panel and showed an iron level of 45, UIBC of 256, TIBC 3 1, saturation ratios of 15%, ferritin 92, folate level 35.5 and vitamin B12 level of 399 -Continue to Monitor for S/Sx of Bleeding; No overt bleeding noted -Repeat CBC in the AM   GERD/GI Prophylaxis -C/w IV PPI with Pantoprazole 40 mg po Daily   Hypoalbuminemia -Patient's Albumin Trend: Recent Labs  Lab 05/13/23 1453 05/28/23 1227 05/29/23 0642 05/30/23 0516  ALBUMIN 4.1 3.3* 2.6* 2.5*  -Continue to Monitor and Trend and repeat CMP in the AM  Obesity -Complicates overall prognosis and care -Estimated body mass index is 33.48 kg/m as calculated from the  following:   Height as of 05/27/23: 5\' 3"  (1.6 m).   Weight as of 05/27/23: 85.7 kg.  -Weight Loss and Dietary Counseling given  DVT prophylaxis:  apixaban (ELIQUIS) tablet 5 mg    Code Status: Full Code Family Communication: No family currently at bedside  Disposition Plan:  Level of care: Med-Surg Status is: Inpatient Remains inpatient appropriate because: Needs further clinical improvement and improvement in her output of her ostomy   Consultants:  None  Procedures:  As delineated as above  Antimicrobials:  Anti-infectives (From admission, onward)    Start     Dose/Rate Route Frequency Ordered Stop   05/29/23 1900  cefTRIAXone (ROCEPHIN) 1 g in sodium chloride 0.9 % 100 mL IVPB        1 g 200 mL/hr over 30 Minutes Intravenous Every 24 hours 05/28/23 1957     05/28/23 1845  cefTRIAXone (ROCEPHIN) 1 g in sodium chloride 0.9 % 100 mL IVPB        1 g 200 mL/hr over 30 Minutes Intravenous  Once 05/28/23 1834 05/28/23 1950       Subjective: Seen and examined at bedside and continues to have high output.  States that she has not really been eating anything but is wants to eat something more substantial so diet  will be advanced from a full liquid diet to soft diet.  No nausea or vomiting today.  Feels okay.  No other concerns or complaints this time.  Objective: Vitals:   05/29/23 2357 05/30/23 0519 05/30/23 0953 05/30/23 1303  BP: (!) 115/58 114/73 132/74 127/84  Pulse: 98 97 97 (!) 104  Resp: 17 18 16 16   Temp: 98.1 F (36.7 C) 97.8 F (36.6 C) 98.6 F (37 C) 98.1 F (36.7 C)  TempSrc: Oral Oral Oral   SpO2: 95% 96% 98% 99%    Intake/Output Summary (Last 24 hours) at 05/30/2023 1824 Last data filed at 05/30/2023 0500 Gross per 24 hour  Intake 2868.25 ml  Output 100 ml  Net 2768.25 ml   There were no vitals filed for this visit.  Examination: Physical Exam:  Constitutional: WN/WD obese Caucasian female currently in no acute distress Respiratory: Diminished to  auscultation bilaterally, no wheezing, rales, rhonchi or crackles. Normal respiratory effort and patient is not tachypenic. No accessory muscle use.  Unlabored breathing Cardiovascular: RRR, no murmurs / rubs / gallops. S1 and S2 auscultated. No extremity edema.   Abdomen: Soft, non-tender, distended secondary to body habitus and has an ileostomy noted. Bowel sounds positive.  GU: Deferred. Musculoskeletal: No clubbing / cyanosis of digits/nails. No joint deformity upper and lower extremities. Skin: No rashes, lesions, ulcers on limited skin evaluation. No induration; Warm and dry.  Neurologic: CN 2-12 grossly intact with no focal deficits. Romberg sign and cerebellar reflexes not assessed.  Psychiatric: Normal judgment and insight. Alert and oriented x 3. Normal mood and appropriate affect.   Data Reviewed: I have personally reviewed following labs and imaging studies  CBC: Recent Labs  Lab 05/28/23 1227 05/29/23 0642 05/30/23 0516  WBC 19.3* 16.5* 12.5*  NEUTROABS  --   --  7.9*  HGB 12.8 11.0* 9.8*  HCT 40.1 33.6* 29.7*  MCV 103.6* 102.8* 102.1*  PLT 411* 317 290   Basic Metabolic Panel: Recent Labs  Lab 05/28/23 1227 05/29/23 0642 05/30/23 0516  NA 135 134* 133*  K 3.9 3.0* 3.3*  CL 111 104 96*  CO2 16* 17* 24  GLUCOSE 145* 159* 148*  BUN 49* 50* 36*  CREATININE 3.68* 3.38* 3.21*  CALCIUM 8.4* 8.4* 7.9*  MG 1.1* 1.9 1.8  PHOS  --   --  4.0   GFR: Estimated Creatinine Clearance: 17.2 mL/min (A) (by C-G formula based on SCr of 3.21 mg/dL (H)). Liver Function Tests: Recent Labs  Lab 05/28/23 1227 05/29/23 0642 05/30/23 0516  AST 20 16 16   ALT 26 19 18   ALKPHOS 145* 117 109  BILITOT 0.5 0.6 0.3  PROT 7.6 6.3* 6.0*  ALBUMIN 3.3* 2.6* 2.5*   Recent Labs  Lab 05/28/23 1227  LIPASE 114*   No results for input(s): "AMMONIA" in the last 168 hours. Coagulation Profile: No results for input(s): "INR", "PROTIME" in the last 168 hours. Cardiac Enzymes: No  results for input(s): "CKTOTAL", "CKMB", "CKMBINDEX", "TROPONINI" in the last 168 hours. BNP (last 3 results) No results for input(s): "PROBNP" in the last 8760 hours. HbA1C: No results for input(s): "HGBA1C" in the last 72 hours. CBG: No results for input(s): "GLUCAP" in the last 168 hours. Lipid Profile: No results for input(s): "CHOL", "HDL", "LDLCALC", "TRIG", "CHOLHDL", "LDLDIRECT" in the last 72 hours. Thyroid Function Tests: No results for input(s): "TSH", "T4TOTAL", "FREET4", "T3FREE", "THYROIDAB" in the last 72 hours. Anemia Panel: Recent Labs    05/30/23 0516  VITAMINB12 399  FOLATE 35.5  FERRITIN 92  TIBC 301  IRON 45  RETICCTPCT 2.8   Sepsis Labs: Recent Labs  Lab 05/28/23 2119 05/28/23 2130  PROCALCITON 0.45  --   LATICACIDVEN 1.5 1.2   Recent Results (from the past 240 hour(s))  Urine Culture     Status: Abnormal   Collection Time: 05/28/23  5:17 PM   Specimen: Urine, Random  Result Value Ref Range Status   Specimen Description   Final    URINE, RANDOM Performed at [facility], [address], [place], [place] [address]    Special Requests   Final    NONE Reflexed from [id number] Performed at [facility], [address], [place], [place] [address]    Culture MULTIPLE SPECIES PRESENT, SUGGEST RECOLLECTION (A)  Final   Report Status 05/29/2023 FINAL  Final  SARS Coronavirus 2 by RT PCR (hospital order, performed in [facility] hospital lab) *cepheid single result test* Anterior Nasal Swab     Status: None   Collection Time: 05/28/23  7:15 PM   Specimen: Anterior Nasal Swab  Result Value Ref Range Status   SARS Coronavirus 2 by RT PCR NEGATIVE NEGATIVE Final    Comment: (NOTE) SARS-CoV-2 target nucleic acids are NOT DETECTED.  The SARS-CoV-2 RNA is generally detectable in upper and lower respiratory specimens during the acute phase of infection. The lowest concentration of SARS-CoV-2 viral copies this assay can  detect is 250 copies / mL. A negative result does not preclude SARS-CoV-2 infection and should not be used as the sole basis for treatment or other patient management decisions.  A negative result may occur with improper specimen collection / handling, submission of specimen other than nasopharyngeal swab, presence of viral mutation(s) within the areas targeted by this assay, and inadequate number of viral copies (<250 copies / mL). A negative result must be combined with clinical observations, patient history, and epidemiological information.  Fact Sheet for Patients:   RoadLapTop.co.za  Fact Sheet for Healthcare Providers: http://kim-miller.com/  This test is not yet approved or  cleared by the [place] FDA and has been authorized for detection and/or diagnosis of SARS-CoV-2 by FDA under an Emergency Use Authorization (EUA).  This EUA will remain in effect (meaning this test can be used) for the duration of the COVID-19 declaration under Section 564(b)(1) of the Act, 21 U.S.C. section 360bbb-3(b)(1), unless the authorization is terminated or revoked sooner.  Performed at [facility], [address], [place], [place] [address]   Blood culture (routine x 2)     Status: None (Preliminary result)   Collection Time: 05/28/23  7:21 PM   Specimen: BLOOD  Result Value Ref Range Status   Specimen Description   Final    BLOOD LEFT ANTECUBITAL Performed at [facility], [address] [place], [place] [address]    Special Requests   Final    BOTTLES DRAWN AEROBIC AND ANAEROBIC Blood Culture adequate volume Performed at [facility], [address] [place], [place] [address]    Culture   Final    NO GROWTH 1 DAY Performed at [facility], [address] [place], [place] [address]    Report Status PENDING  Incomplete  Blood culture (routine x 2)     Status: None (Preliminary result)    Collection Time: 05/28/23  7:50 PM   Specimen: BLOOD  Result Value Ref Range Status   Specimen Description   Final    BLOOD RIGHT  ANTECUBITAL Performed at [facility], [address] [place], [place] [address]    Special Requests   Final    BOTTLES DRAWN AEROBIC AND ANAEROBIC Blood Culture adequate volume Performed at [facility], [address], [place], [place] [address]    Culture   Final    NO GROWTH 1 DAY Performed at [facility], [address] [place], [place] [address]    Report Status PENDING  Incomplete  C Difficile Quick Screen w PCR reflex     Status: None   Collection Time: 05/29/23  7:45 PM   Specimen: STOOL  Result Value Ref Range Status   C Diff antigen NEGATIVE NEGATIVE Final   C Diff toxin NEGATIVE NEGATIVE Final   C Diff interpretation No C. difficile detected.  Final    Comment: Performed at [facility], [address] [place], [place] [address]  Gastrointestinal Panel by PCR , Stool     Status: None   Collection Time: 05/29/23  7:45 PM   Specimen: STOOL  Result Value Ref Range Status   Campylobacter species NOT DETECTED NOT DETECTED Final   Plesimonas shigelloides NOT DETECTED NOT DETECTED Final   Salmonella species NOT DETECTED NOT DETECTED Final   Yersinia enterocolitica NOT DETECTED NOT DETECTED Final   Vibrio species NOT DETECTED NOT DETECTED Final   Vibrio cholerae NOT DETECTED NOT DETECTED Final   Enteroaggregative E coli (EAEC) NOT DETECTED NOT DETECTED Final   Enteropathogenic E coli (EPEC) NOT DETECTED NOT DETECTED Final   Enterotoxigenic E coli (ETEC) NOT DETECTED NOT DETECTED Final   Shiga like toxin producing E coli (STEC) NOT DETECTED NOT DETECTED Final   Shigella/Enteroinvasive E coli (EIEC) NOT DETECTED NOT DETECTED Final   Cryptosporidium NOT DETECTED NOT DETECTED Final   Cyclospora cayetanensis NOT DETECTED NOT DETECTED Final   Entamoeba histolytica NOT DETECTED NOT DETECTED  Final   Giardia lamblia NOT DETECTED NOT DETECTED Final   Adenovirus F40/41 NOT DETECTED NOT DETECTED Final   Astrovirus NOT DETECTED NOT DETECTED Final   Norovirus GI/GII NOT DETECTED NOT DETECTED Final   Rotavirus A NOT DETECTED NOT DETECTED Final   Sapovirus (I, II, IV, and V) NOT DETECTED NOT DETECTED Final    Comment: Performed at [facility], [address], [place], [place] [address]    Radiology Studies: IR CV Line Injection  Result Date: 05/30/2023 INDICATION: 67 year old female with a history of recently placed port catheter on 04/12/2023. Unfortunately, the catheter has not aspirated well. She presents for port catheter injection and evaluation. EXAM: Catheter injection MEDICATIONS: None. ANESTHESIA/SEDATION: None. FLUOROSCOPY TIME:  Radiation exposure index: 26 mGy reference air kerma COMPLICATIONS: None immediate. PROCEDURE: Informed written consent was obtained from the patient after a thorough discussion of the procedural risks, benefits and alternatives. All questions were addressed. Maximal Sterile Barrier Technique was utilized including caps, mask, sterile gowns, sterile gloves, sterile drape, hand hygiene and skin antiseptic. A timeout was performed prior to the initiation of the procedure. The port catheter was accessed sterilely. 5 mL Omnipaque 300 was diluted in 5 mL saline. Digital subtraction angiography was performed. There is a fibrin sheath limiting flow from the tip of the catheter. The injected contrast material tracks back along the fibrin sheath before spilling out into superior vena cava. The catheter tip is well positioned at the cavoatrial junction. IMPRESSION: Well-positioned right IJ single-lumen power injectable port catheter. Port injection is positive for the presence of a fibrin sheath which  is limiting aspiration. Patient will be scheduled for an attempt at fibrin sheath stripping. Electronically Signed   By: Malachy Moan M.D.   On: 05/30/2023  13:24    Scheduled Meds:  allopurinol  150 mg Oral QHS   apixaban  5 mg Oral BID   cholestyramine light  4 g Oral TID PC   DULoxetine  120 mg Oral Daily   folic acid  1 mg Oral Daily   loperamide  4 mg Oral BID   metoprolol succinate  100 mg Oral QHS   pantoprazole (PROTONIX) IV  40 mg Intravenous Q24H   potassium chloride  40 mEq Oral BID   simethicone  120 mg Oral QHS   Continuous Infusions:  cefTRIAXone (ROCEPHIN)  IV Stopped (05/29/23 2010)   promethazine (PHENERGAN) injection (IM or IVPB)     sodium bicarbonate 150 mEq in dextrose 5 % 1,150 mL infusion 125 mL/hr at 05/30/23 0849    LOS: 1 day   Marguerita Merles, DO [facility] Available via Epic secure chat 7am-7pm After these hours, please refer to coverage provider listed on [email or web address] 05/30/2023, 6:24 PM

## 2023-05-31 ENCOUNTER — Inpatient Hospital Stay (HOSPITAL_COMMUNITY): Payer: Medicare Other

## 2023-05-31 ENCOUNTER — Ambulatory Visit (HOSPITAL_COMMUNITY): Payer: Medicare Other

## 2023-05-31 DIAGNOSIS — E8722 Chronic metabolic acidosis: Secondary | ICD-10-CM | POA: Diagnosis not present

## 2023-05-31 DIAGNOSIS — R112 Nausea with vomiting, unspecified: Secondary | ICD-10-CM | POA: Diagnosis not present

## 2023-05-31 DIAGNOSIS — N390 Urinary tract infection, site not specified: Secondary | ICD-10-CM | POA: Diagnosis not present

## 2023-05-31 DIAGNOSIS — N179 Acute kidney failure, unspecified: Secondary | ICD-10-CM | POA: Diagnosis not present

## 2023-05-31 LAB — CBC WITH DIFFERENTIAL/PLATELET
Abs Immature Granulocytes: 0.21 10*3/uL — ABNORMAL HIGH (ref 0.00–0.07)
Basophils Absolute: 0 10*3/uL (ref 0.0–0.1)
Basophils Relative: 0 %
Eosinophils Absolute: 1.9 10*3/uL — ABNORMAL HIGH (ref 0.0–0.5)
Eosinophils Relative: 15 %
HCT: 29.2 % — ABNORMAL LOW (ref 36.0–46.0)
Hemoglobin: 9.6 g/dL — ABNORMAL LOW (ref 12.0–15.0)
Immature Granulocytes: 2 %
Lymphocytes Relative: 17 %
Lymphs Abs: 2.2 10*3/uL (ref 0.7–4.0)
MCH: 33.4 pg (ref 26.0–34.0)
MCHC: 32.9 g/dL (ref 30.0–36.0)
MCV: 101.7 fL — ABNORMAL HIGH (ref 80.0–100.0)
Monocytes Absolute: 0.8 10*3/uL (ref 0.1–1.0)
Monocytes Relative: 6 %
Neutro Abs: 7.7 10*3/uL (ref 1.7–7.7)
Neutrophils Relative %: 60 %
Platelets: 287 10*3/uL (ref 150–400)
RBC: 2.87 MIL/uL — ABNORMAL LOW (ref 3.87–5.11)
RDW: 15.1 % (ref 11.5–15.5)
WBC: 12.8 10*3/uL — ABNORMAL HIGH (ref 4.0–10.5)
nRBC: 0.2 % (ref 0.0–0.2)

## 2023-05-31 LAB — COMPREHENSIVE METABOLIC PANEL
ALT: 20 U/L (ref 0–44)
AST: 21 U/L (ref 15–41)
Albumin: 2.7 g/dL — ABNORMAL LOW (ref 3.5–5.0)
Alkaline Phosphatase: 116 U/L (ref 38–126)
Anion gap: 12 (ref 5–15)
BUN: 25 mg/dL — ABNORMAL HIGH (ref 8–23)
CO2: 36 mmol/L — ABNORMAL HIGH (ref 22–32)
Calcium: 8.1 mg/dL — ABNORMAL LOW (ref 8.9–10.3)
Chloride: 91 mmol/L — ABNORMAL LOW (ref 98–111)
Creatinine, Ser: 2.76 mg/dL — ABNORMAL HIGH (ref 0.44–1.00)
GFR, Estimated: 18 mL/min — ABNORMAL LOW (ref >60)
Glucose, Bld: 141 mg/dL — ABNORMAL HIGH (ref 70–99)
Potassium: 3.2 mmol/L — ABNORMAL LOW (ref 3.5–5.1)
Sodium: 139 mmol/L (ref 135–145)
Total Bilirubin: 0.5 mg/dL (ref 0.3–1.2)
Total Protein: 6.2 g/dL — ABNORMAL LOW (ref 6.5–8.1)

## 2023-05-31 LAB — PHOSPHORUS: Phosphorus: 3.1 mg/dL (ref 2.5–4.6)

## 2023-05-31 LAB — MAGNESIUM: Magnesium: 2 mg/dL (ref 1.7–2.4)

## 2023-05-31 MED ORDER — SODIUM CHLORIDE 0.9 % IV SOLN: INTRAVENOUS | Status: DC

## 2023-05-31 MED ORDER — POTASSIUM CHLORIDE 20 MEQ PO PACK
40.0000 meq | PACK | Freq: Once | ORAL | Status: AC
  Administered 2023-05-31: 40 meq via ORAL
  Filled 2023-05-31: qty 2

## 2023-05-31 MED ORDER — POTASSIUM CHLORIDE CRYS ER 20 MEQ PO TBCR
40.0000 meq | EXTENDED_RELEASE_TABLET | Freq: Two times a day (BID) | ORAL | Status: AC
  Administered 2023-05-31 (×2): 40 meq via ORAL
  Filled 2023-05-31 (×2): qty 2

## 2023-05-31 NOTE — Plan of Care (Signed)

## 2023-05-31 NOTE — Plan of Care (Signed)
Pt continues to have nausea overnight. Problem: Education: Goal: Knowledge of General Education information will improve Description: Including pain rating scale, medication(s)/side effects and non-pharmacologic comfort measures Outcome: Progressing   Problem: Health Behavior/Discharge Planning: Goal: Ability to manage health-related needs will improve Outcome: Progressing   Problem: Clinical Measurements: Goal: Ability to maintain clinical measurements within normal limits will improve Outcome: Progressing Goal: Will remain free from infection Outcome: Progressing Goal: Diagnostic test results will improve Outcome: Progressing Goal: Respiratory complications will improve Outcome: Progressing Goal: Cardiovascular complication will be avoided Outcome: Progressing   Problem: Activity: Goal: Risk for activity intolerance will decrease Outcome: Progressing   Problem: Nutrition: Goal: Adequate nutrition will be maintained Outcome: Progressing   Problem: Coping: Goal: Level of anxiety will decrease Outcome: Progressing   Problem: Elimination: Goal: Will not experience complications related to bowel motility Outcome: Progressing Goal: Will not experience complications related to urinary retention Outcome: Progressing   Problem: Pain Managment: Goal: General experience of comfort will improve Outcome: Progressing   Problem: Safety: Goal: Ability to remain free from injury will improve Outcome: Progressing   Problem: Skin Integrity: Goal: Risk for impaired skin integrity will decrease Outcome: Progressing

## 2023-05-31 NOTE — Progress Notes (Signed)
PROGRESS NOTE    [name]  UJW:[id number] DOB: Apr 21, 1956 DOA: 05/28/2023 PCP: [name], DO   Brief Narrative:  HPI per Dr. Lindajo Royal on 05/28/23 [name] is a 67 y.o. 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  Patient is followed by GI, Dr. Adela Lank and receives prophylactic outpatient IV infusions (1 L NS bolus q. MWF) as well as monthly IM octreotide.  Her last infusion was the day prior on 9/20.  She complains of suprapubic and left lower quadrant discomfort.    **Interim History Being treated for Suspected UTI and Continues to have very high output from her ileostomy but thinks is improving now.  Nausea was slowly improving however she had episode of nausea vomiting last night of unclear etiology several hours after started dry heaving.  Will need to continue antiemetics and continue to see if she is able to tolerate oral intake.  IV fluid hydration has now been reduced to half and changed to normal saline.  If she continues to have some nausea monitoring may warrant a repeat CT scan or discussion with the specialist including GI or General Surgery.   Assessment and Plan:  Suspected urinary Tract Infection Nausea and Vomiting had improved but now reoccurred yesterday Increased and High ileostomy output Sepsis 2/2 to UTI, improving  -CT abdomen was nonacute and showed " No acute abnormality in the abdomen or pelvis. Postsurgical  changes from colectomy and right upper quadrant ileostomy. No evidence of bowel obstruction. Small hiatal hernia. Aortic Atherosclerosis." -Sepsis criteria with tachycardia, leukocytosis, AKI and Source of Suspected Infection with UTI -Lactic Acid Level Trend and PCT was 0.45 Recent Labs  Lab 05/28/23 2119 05/28/23 2130  LATICACIDVEN 1.5 1.2  -WBC Trend: Recent Labs  Lab 05/13/23 1453 05/28/23 1227 05/29/23 0642 05/30/23 0516 05/31/23 0549  WBC 12.6* 19.3* 16.5* 12.5* 12.8*  -Follow lactic acid and Procalcitonin -IV Hydration being continued with a bicarbonate drip but this was changed to normal saline at 75 mL/h, IV Antiemetics, IV PPI -Continue IV Ceftriaxone and follow urine cultures and showed Multiple Species Present so will repeat -Patient was on a regular diet but vomited last night so we will need to monitor carefully -GI Pathogen Panel and C Diff Negative  -Monitor electrolytes and replete as needed -COVID Screen Negative  -Will get KUB this afternoon -Consider GI or General Surgery evaluation if not improving further and she had episode of nausea vomiting last night of unclear etiology 4 hours after eating her meal   Hypomagnesemia -Patient's Mag Level Trend: Recent Labs  Lab 05/28/23 1227 05/29/23 0642 05/30/23 0516 05/31/23 0549  MG 1.1* 1.9 1.8 2.0  -Continue to Monitor and Replete as Necessary -Repeat Mag in the AM   Hypokalemia -Patient's K+ Level Trend: Recent Labs  Lab 05/13/23 1453 05/28/23 1227 05/29/23 0642 05/30/23 0516 05/31/23 0549  K 3.4* 3.9 3.0* 3.3* 3.2*  -Replete with po Kcl 40 mEQ BID x2 again -Continue to Monitor and Replete as Necessary -Repeat CMP in the  AM   Hyponatremia -Improved. 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 -Replace fluids and electrolyte losses and c/w Sodium Bicarbonate 125 mL/hr -Continue Cholestyramine 4 grams po TID -Check GI Pathogen Panel and C Difficile in both negative -C/w Loperamide 4 mg po BID for now -Nursing was not measuring appropriately but I spoke with the nurse today to measure how much output she had and the goal is to have less than 1200 cc a day   Chronic Anticoagulation History of VTE -Continue Anticoagulation with Apixaban 5 mg po BID   Essential Hypertension -Continue to Monitor BP per Ptotocol -Continue Metoprolol Succinate 100 mg po qHS -Last BP reading was 127/84   Deppresion and Anxiety state -C/w Duloxetine 120 mg po Daily  AKI on CKD Stage V, improving  slowly  Chronic metabolic acidosis, possible acute component -IV hydration with Sodium Bicarbonate now changed to IV NS at 75 mL/hr  -BUN/Cr Trend: Recent Labs  Lab 05/13/23 1453 05/28/23 1227 05/29/23 0642 05/30/23 0516 05/31/23 0549  BUN 47* 49* 50* 36* 25*  CREATININE 3.34* 3.68* 3.38* 3.21* 2.76*  -Avoid Nephrotoxic Medications, Contrast Dyes, Hypotension and Dehydration to Ensure Adequate Renal Perfusion and will need to Renally Adjust Meds -Continue to Monitor and Trend Renal Function carefully and repeat CMP in the AM   Macrocytic Anemia/Anemia of Chronic Disease -Hgb/Hct Trend: Recent Labs  Lab 05/13/23 1453 05/28/23 1227 05/29/23 0642 05/30/23 0516 05/31/23 0549  HGB 10.8* 12.8 11.0* 9.8* 9.6*  HCT 33.3* 40.1 33.6* 29.7* 29.2*  MCV 103.1* 103.6* 102.8* 102.1* 101.7*  -Checked Anemia Panel and showed an iron level of 45, UIBC of 256, TIBC 3 1, saturation ratios of 15%, ferritin 92, folate level 35.5 and vitamin B12 level of 399 -Continue to Monitor for S/Sx of Bleeding; No overt bleeding noted -Repeat CBC in the AM   GERD/GI Prophylaxis -C/w IV PPI with Pantoprazole 40 mg po Daily   Hypoalbuminemia -Patient's Albumin  Trend: Recent Labs  Lab 05/13/23 1453 05/28/23 1227 05/29/23 0642 05/30/23 0516 05/31/23 0549  ALBUMIN 4.1 3.3* 2.6* 2.5* 2.7*  -Continue to Monitor and Trend and repeat CMP in the AM  Obesity -Complicates overall prognosis and care -Estimated body mass index is 33.48 kg/m as calculated from the following:   Height as of 05/27/23: 5\' 3"  (1.6 m).   Weight as of 05/27/23: 85.7 kg.  -Weight Loss and Dietary Counseling given   DVT prophylaxis:  apixaban (ELIQUIS) tablet 5 mg    Code Status: Full Code Family Communication: No family present at bedside  Disposition Plan:  Level of care: Med-Surg Status is: Inpatient Remains inpatient appropriate because: Needs further clinical improvement and tolerance of diet prior to discharging  Consultants:  None  Procedures:  As delineated as above   Antimicrobials:  Anti-infectives (From admission, onward)    Start     Dose/Rate Route Frequency Ordered Stop   05/29/23 1900  cefTRIAXone (ROCEPHIN) 1 g in sodium chloride 0.9 % 100 mL IVPB        1 g 200 mL/hr over 30 Minutes Intravenous Every 24 hours 05/28/23 1957     05/28/23 1845  cefTRIAXone (ROCEPHIN) 1 g in sodium chloride 0.9 % 100 mL IVPB  1 g 200 mL/hr over 30 Minutes Intravenous  Once 05/28/23 1834 05/28/23 1950       Subjective: Seen and examined bedside and has been eating very little today and had a episode of nausea vomiting and significant dry heaving last night.  States that she ate her supper around 5 PM and then around 9 PM she started vomiting and had intractable dry heaving which then subsequently abated with antiemetics.  Given this she is only eating very little this morning.  IV fluid hydration is to be continued but will reduce rate today.  Encouraged her to try and eat with antiemetics as well.  Will continue to monitor and patient thinks her ostomy output has improved significantly.  Objective: Vitals:   05/30/23 1303 05/30/23 2100 05/31/23 0449  05/31/23 1227  BP: 127/84 129/77 125/76 (!) 168/88  Pulse: (!) 104 97 93 95  Resp: 16 17 17    Temp: 98.1 F (36.7 C) 98.2 F (36.8 C) 97.9 F (36.6 C) 98.1 F (36.7 C)  TempSrc:  Oral Oral   SpO2: 99% 100% 94% 98%    Intake/Output Summary (Last 24 hours) at 05/31/2023 1419 Last data filed at 05/31/2023 0646 Gross per 24 hour  Intake 100 ml  Output --  Net 100 ml   There were no vitals filed for this visit.  Examination: Physical Exam:  Constitutional: WN/WD obese Caucasian female in no acute distress Respiratory: Diminished to auscultation bilaterally, no wheezing, rales, rhonchi or crackles. Normal respiratory effort and patient is not tachypenic. No accessory muscle use.  Unlabored breathing Cardiovascular: RRR, no murmurs / rubs / gallops. S1 and S2 auscultated. No extremity edema.   Abdomen: Soft, mildly-tender, distended secondary body habitus and ileostomy is noted. Bowel sounds positive.  GU: Deferred. Musculoskeletal: No clubbing / cyanosis of digits/nails. No joint deformity upper and lower extremities Skin: No rashes, lesions, ulcers on limited skin evaluation. No induration; Warm and dry.  Neurologic: CN 2-12 grossly intact with no focal deficits. Romberg sign and cerebellar reflexes not assessed.  Psychiatric: Normal judgment and insight. Alert and oriented x 3. Normal mood and appropriate affect.   Data Reviewed: I have personally reviewed following labs and imaging studies  CBC: Recent Labs  Lab 05/28/23 1227 05/29/23 0642 05/30/23 0516 05/31/23 0549  WBC 19.3* 16.5* 12.5* 12.8*  NEUTROABS  --   --  7.9* 7.7  HGB 12.8 11.0* 9.8* 9.6*  HCT 40.1 33.6* 29.7* 29.2*  MCV 103.6* 102.8* 102.1* 101.7*  PLT 411* 317 290 287   Basic Metabolic Panel: Recent Labs  Lab 05/28/23 1227 05/29/23 0642 05/30/23 0516 05/31/23 0549  NA 135 134* 133* 139  K 3.9 3.0* 3.3* 3.2*  CL 111 104 96* 91*  CO2 16* 17* 24 36*  GLUCOSE 145* 159* 148* 141*  BUN 49* 50* 36* 25*   CREATININE 3.68* 3.38* 3.21* 2.76*  CALCIUM 8.4* 8.4* 7.9* 8.1*  MG 1.1* 1.9 1.8 2.0  PHOS  --   --  4.0 3.1   GFR: Estimated Creatinine Clearance: 20 mL/min (A) (by C-G formula based on SCr of 2.76 mg/dL (H)). Liver Function Tests: Recent Labs  Lab 05/28/23 1227 05/29/23 0642 05/30/23 0516 05/31/23 0549  AST 20 16 16 21   ALT 26 19 18 20   ALKPHOS 145* 117 109 116  BILITOT 0.5 0.6 0.3 0.5  PROT 7.6 6.3* 6.0* 6.2*  ALBUMIN 3.3* 2.6* 2.5* 2.7*   Recent Labs  Lab 05/28/23 1227  LIPASE 114*   No results for input(s): "  AMMONIA" in the last 168 hours. Coagulation Profile: No results for input(s): "INR", "PROTIME" in the last 168 hours. Cardiac Enzymes: No results for input(s): "CKTOTAL", "CKMB", "CKMBINDEX", "TROPONINI" in the last 168 hours. BNP (last 3 results) No results for input(s): "PROBNP" in the last 8760 hours. HbA1C: No results for input(s): "HGBA1C" in the last 72 hours. CBG: No results for input(s): "GLUCAP" in the last 168 hours. Lipid Profile: No results for input(s): "CHOL", "HDL", "LDLCALC", "TRIG", "CHOLHDL", "LDLDIRECT" in the last 72 hours. Thyroid Function Tests: No results for input(s): "TSH", "T4TOTAL", "FREET4", "T3FREE", "THYROIDAB" in the last 72 hours. Anemia Panel: Recent Labs    05/30/23 0516  VITAMINB12 399  FOLATE 35.5  FERRITIN 92  TIBC 301  IRON 45  RETICCTPCT 2.8   Sepsis Labs: Recent Labs  Lab 05/28/23 2119 05/28/23 2130  PROCALCITON 0.45  --   LATICACIDVEN 1.5 1.2   Recent Results (from the past 240 hour(s))  Urine Culture     Status: Abnormal   Collection Time: 05/28/23  5:17 PM   Specimen: Urine, Random  Result Value Ref Range Status   Specimen Description   Final    URINE, RANDOM Performed at [facility], [address], [place], [place] [address]    Special Requests   Final    NONE Reflexed from [phone] Performed at [facility], [address], [place], [place] [address]     Culture MULTIPLE SPECIES PRESENT, SUGGEST RECOLLECTION (A)  Final   Report Status 05/29/2023 FINAL  Final  SARS Coronavirus 2 by RT PCR (hospital order, performed in [facility] hospital lab) *cepheid single result test* Anterior Nasal Swab     Status: None   Collection Time: 05/28/23  7:15 PM   Specimen: Anterior Nasal Swab  Result Value Ref Range Status   SARS Coronavirus 2 by RT PCR NEGATIVE NEGATIVE Final    Comment: (NOTE) SARS-CoV-2 target nucleic acids are NOT DETECTED.  The SARS-CoV-2 RNA is generally detectable in upper and lower respiratory specimens during the acute phase of infection. The lowest concentration of SARS-CoV-2 viral copies this assay can detect is 250 copies / mL. A negative result does not preclude SARS-CoV-2 infection and should not be used as the sole basis for treatment or other patient management decisions.  A negative result may occur with improper specimen collection / handling, submission of specimen other than nasopharyngeal swab, presence of viral mutation(s) within the areas targeted by this assay, and inadequate number of viral copies (<250 copies / mL). A negative result must be combined with clinical observations, patient history, and epidemiological information.  Fact Sheet for Patients:   RoadLapTop.co.za  Fact Sheet for Healthcare Providers: http://kim-miller.com/  This test is not yet approved or  cleared by the [place] FDA and has been authorized for detection and/or diagnosis of SARS-CoV-2 by FDA under an Emergency Use Authorization (EUA).  This EUA will remain in effect (meaning this test can be used) for the duration of the COVID-19 declaration under Section 564(b)(1) of the Act, 21 U.S.C. section 360bbb-3(b)(1), unless the authorization is terminated or revoked sooner.  Performed at [facility], [address] [place], [place] [address]   Blood culture  (routine x 2)     Status: None (Preliminary result)   Collection Time: 05/28/23  7:21 PM   Specimen: BLOOD  Result Value Ref Range Status   Specimen Description   Final    BLOOD LEFT ANTECUBITAL Performed at [facility], [address] [place], [place] [address]    Special Requests   Final    BOTTLES DRAWN AEROBIC AND ANAEROBIC Blood Culture adequate volume Performed at [facility], [address] [place], [place] [address]    Culture   Final    NO GROWTH 2 DAYS Performed at [facility], [address], [place], [place] [address]    Report Status PENDING  Incomplete  Blood culture (routine x 2)     Status: None (Preliminary result)   Collection Time: 05/28/23  7:50 PM   Specimen: BLOOD  Result Value Ref Range Status   Specimen Description   Final    BLOOD RIGHT ANTECUBITAL Performed at [facility], [address], [place], [place] [address]    Special Requests   Final    BOTTLES DRAWN AEROBIC AND ANAEROBIC Blood Culture adequate volume Performed at [facility], [address] [place], [place] [address]    Culture   Final    NO GROWTH 2 DAYS Performed at [facility], [address] [place], [place] [address]    Report Status PENDING  Incomplete  C Difficile Quick Screen w PCR reflex     Status: None   Collection Time: 05/29/23  7:45 PM   Specimen: STOOL  Result Value Ref Range Status   C Diff antigen NEGATIVE NEGATIVE Final   C Diff toxin NEGATIVE NEGATIVE Final   C Diff interpretation No C. difficile detected.  Final    Comment: Performed at [facility], [address] [place], [place] [address]  Gastrointestinal Panel by PCR , Stool     Status: None   Collection Time: 05/29/23  7:45 PM   Specimen: STOOL  Result Value Ref Range Status   Campylobacter species NOT DETECTED NOT DETECTED Final   Plesimonas shigelloides NOT DETECTED NOT DETECTED Final   Salmonella species NOT  DETECTED NOT DETECTED Final   Yersinia enterocolitica NOT DETECTED NOT DETECTED Final   Vibrio species NOT DETECTED NOT DETECTED Final   Vibrio cholerae NOT DETECTED NOT DETECTED Final   Enteroaggregative E coli (EAEC) NOT DETECTED NOT DETECTED Final   Enteropathogenic E coli (EPEC) NOT DETECTED NOT DETECTED Final   Enterotoxigenic E coli (ETEC) NOT DETECTED NOT DETECTED Final   Shiga like toxin producing E coli (STEC) NOT DETECTED NOT DETECTED Final   Shigella/Enteroinvasive E coli (EIEC) NOT DETECTED NOT DETECTED Final   Cryptosporidium NOT DETECTED NOT DETECTED Final   Cyclospora cayetanensis NOT DETECTED NOT DETECTED Final   Entamoeba histolytica NOT DETECTED NOT DETECTED Final   Giardia lamblia NOT DETECTED NOT DETECTED Final   Adenovirus F40/41 NOT DETECTED NOT DETECTED Final   Astrovirus NOT DETECTED NOT DETECTED Final   Norovirus GI/GII NOT DETECTED NOT DETECTED Final   Rotavirus A NOT DETECTED NOT DETECTED Final   Sapovirus (I, II, IV, and V) NOT DETECTED NOT DETECTED Final    Comment: Performed at [facility], [address], [place], [place] [address]    Radiology Studies: IR CV Line Injection  Result Date: 05/30/2023 INDICATION: 66 year old female with a history of recently placed port catheter on 04/12/2023. Unfortunately, the catheter has not aspirated well. She presents for port catheter injection and evaluation. EXAM: Catheter injection MEDICATIONS: None. ANESTHESIA/SEDATION: None. FLUOROSCOPY TIME:  Radiation exposure index: 26 mGy reference air kerma COMPLICATIONS: None immediate. PROCEDURE: Informed written consent was obtained from the patient after a thorough discussion of the procedural risks,  benefits and alternatives. All questions were addressed. Maximal Sterile Barrier Technique was utilized including caps, mask, sterile gowns, sterile gloves, sterile drape, hand hygiene and skin antiseptic. A timeout was performed prior to the initiation of the procedure.  The port catheter was accessed sterilely. 5 mL Omnipaque 300 was diluted in 5 mL saline. Digital subtraction angiography was performed. There is a fibrin sheath limiting flow from the tip of the catheter. The injected contrast material tracks back along the fibrin sheath before spilling out into superior vena cava. The catheter tip is well positioned at the cavoatrial junction. IMPRESSION: Well-positioned right IJ single-lumen power injectable port catheter. Port injection is positive for the presence of a fibrin sheath which is limiting aspiration. Patient will be scheduled for an attempt at fibrin sheath stripping. Electronically Signed   By: Malachy Moan M.D.   On: 05/30/2023 13:24    Scheduled Meds:  allopurinol  150 mg Oral QHS   apixaban  5 mg Oral BID   cholestyramine light  4 g Oral TID PC   DULoxetine  120 mg Oral Daily   folic acid  1 mg Oral Daily   loperamide  4 mg Oral BID   metoprolol succinate  100 mg Oral QHS   pantoprazole (PROTONIX) IV  40 mg Intravenous Q24H   potassium chloride  40 mEq Oral BID   simethicone  120 mg Oral QHS   Continuous Infusions:  sodium chloride     cefTRIAXone (ROCEPHIN)  IV Stopped (05/30/23 2030)   promethazine (PHENERGAN) injection (IM or IVPB) 12.5 mg (05/31/23 0243)    LOS: 2 days   Marguerita Merles, DO [facility] Available via Epic secure chat 7am-7pm After these hours, please refer to coverage provider listed on [email or web address] 05/31/2023, 2:19 PM

## 2023-06-01 DIAGNOSIS — N390 Urinary tract infection, site not specified: Secondary | ICD-10-CM | POA: Diagnosis not present

## 2023-06-01 DIAGNOSIS — A419 Sepsis, unspecified organism: Secondary | ICD-10-CM | POA: Diagnosis not present

## 2023-06-01 LAB — COMPREHENSIVE METABOLIC PANEL
ALT: 22 U/L (ref 0–44)
AST: 26 U/L (ref 15–41)
Albumin: 2.6 g/dL — ABNORMAL LOW (ref 3.5–5.0)
Alkaline Phosphatase: 118 U/L (ref 38–126)
Anion gap: 10 (ref 5–15)
BUN: 21 mg/dL (ref 8–23)
CO2: 36 mmol/L — ABNORMAL HIGH (ref 22–32)
Calcium: 8.2 mg/dL — ABNORMAL LOW (ref 8.9–10.3)
Chloride: 90 mmol/L — ABNORMAL LOW (ref 98–111)
Creatinine, Ser: 2.81 mg/dL — ABNORMAL HIGH (ref 0.44–1.00)
GFR, Estimated: 18 mL/min — ABNORMAL LOW (ref >60)
Glucose, Bld: 126 mg/dL — ABNORMAL HIGH (ref 70–99)
Potassium: 4 mmol/L (ref 3.5–5.1)
Sodium: 136 mmol/L (ref 135–145)
Total Bilirubin: 0.4 mg/dL (ref 0.3–1.2)
Total Protein: 6.3 g/dL — ABNORMAL LOW (ref 6.5–8.1)

## 2023-06-01 LAB — CBC WITH DIFFERENTIAL/PLATELET
Abs Immature Granulocytes: 0.15 10*3/uL — ABNORMAL HIGH (ref 0.00–0.07)
Basophils Absolute: 0.1 10*3/uL (ref 0.0–0.1)
Basophils Relative: 1 %
Eosinophils Absolute: 2 10*3/uL — ABNORMAL HIGH (ref 0.0–0.5)
Eosinophils Relative: 16 %
HCT: 30.7 % — ABNORMAL LOW (ref 36.0–46.0)
Hemoglobin: 9.5 g/dL — ABNORMAL LOW (ref 12.0–15.0)
Immature Granulocytes: 1 %
Lymphocytes Relative: 19 %
Lymphs Abs: 2.3 10*3/uL (ref 0.7–4.0)
MCH: 33 pg (ref 26.0–34.0)
MCHC: 30.9 g/dL (ref 30.0–36.0)
MCV: 106.6 fL — ABNORMAL HIGH (ref 80.0–100.0)
Monocytes Absolute: 0.7 10*3/uL (ref 0.1–1.0)
Monocytes Relative: 6 %
Neutro Abs: 7 10*3/uL (ref 1.7–7.7)
Neutrophils Relative %: 57 %
Platelets: 276 10*3/uL (ref 150–400)
RBC: 2.88 MIL/uL — ABNORMAL LOW (ref 3.87–5.11)
RDW: 14.7 % (ref 11.5–15.5)
WBC: 12.2 10*3/uL — ABNORMAL HIGH (ref 4.0–10.5)
nRBC: 0 % (ref 0.0–0.2)

## 2023-06-01 LAB — PHOSPHORUS: Phosphorus: 2.7 mg/dL (ref 2.5–4.6)

## 2023-06-01 LAB — MAGNESIUM: Magnesium: 1.6 mg/dL — ABNORMAL LOW (ref 1.7–2.4)

## 2023-06-01 NOTE — Discharge Summary (Signed)
Physician Discharge Summary   Patient: [name] MRN: [id number] DOB: [birth year]  Admit date:     05/28/2023  Discharge date: 06/01/23  Discharge Physician: Jonah Blue   PCP: Zola Button, Grayling Congress, DO   Recommendations at discharge:   Antibiotics completed for UTI Follow up with Dr. Zola Button in 1-2 weeks Monitor for excessive ostomy output and follow up prior to onset of dehydration if present Follow up with IR for fibrin sheath stripping - they will contact you for an appointment  Discharge Diagnoses: Principal Problem:   Sepsis secondary to UTI [facility]) Active Problems:   Nausea and vomiting   Acute kidney injury superimposed on chronic kidney disease V (HCC)   Hypomagnesemia   High output ileostomy (HCC)   IBD (inflammatory bowel disease)   Anxiety state   Essential hypertension   Chronic anticoagulation   Hospital Course: [phone] with h/o Crohn's disease s/p total colectomy and end ileostomy, with prior admissions for intractable nausea and vomiting associated with fluid and electrolyte replacement, most recently 5/28 to 02/03/2023, as well as history of CKD 5 with chronic metabolic acidosis, anxiety/depression, and 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  Patient is followed by GI, Dr. Adela Lank and receives prophylactic outpatient IV infusions (1 L NS bolus q. MWF) as well as monthly IM octreotide, most recently on 9/20.  Treated for UTI with ongoing high-ostomy output and periodic n/v.   Assessment and Plan:  Sepsis due to UTI On presentation patient had tachycardia, leukocytosis, and AKI  Patient presented with intractable n/v, now resolved CT unremarkable Suspected source of infection was UTI UA with multiple species Blood cultures negative x 3 days GI Pathogen Panel and C Diff Negative  She has improved, feeling better, able to tolerate a diet Has completed 4 days of ceftriaxone, will  stop Will dc to home   Hypomagnesemia/hypokalemia/hyponatremia Likely related to dehydration in the setting of high ostomy output and possible UTI Repleted    High output ileostomy  IBD s/p total colectomy and end ileostomy She reports that this is generally chronic for her and back to baseline On outpatient IV infusions + octreotide  Port complication IR will scheduled outpatient fibrin sheath stripping   History of VTE Continue Anticoagulation with Apixaban 5 mg po BID   Essential Hypertension Continue Metoprolol  Depression/Anxiety Continue duloxetine   AKI on CKD Stage V Appears to be stable at this time, back to baseline Attempt to avoid nephrotoxic medications   Macrocytic Anemia/Anemia of Chronic Disease Generally stable at this time and for the last 3 days   GERD/GI Prophylaxis Treated with IV Protonix   Obesity Complicates overall prognosis and care BMI 33.48 Weight loss should be encouraged         Consultants: PT/OT TOC team   Procedures: None   Antibiotics: Ceftriaxone 9/22-25    30 Day Unplanned Readmission Risk Score    Flowsheet Row ED to Hosp-Admission (Current) from 05/28/2023 in [facility]  30 Day Unplanned Readmission Risk Score (%) 27.51 Filed at 06/01/2023 1200       This score is the patient's risk of an unplanned readmission within 30 days of being discharged (0 -100%). The score is based on dignosis, age, lab data, medications, orders, and past utilization.   Low:  0-14.9   Medium: 15-21.9   High: 22-29.9   Extreme: 30 and above  Pain control - [employer] Controlled Substance Reporting System database was reviewed. and patient was instructed, not to drive, operate heavy machinery, perform activities at heights, swimming or participation in water activities or provide baby-sitting services while on Pain, Sleep and Anxiety Medications; until their outpatient Physician has  advised to do so again. Also recommended to not to take more than prescribed Pain, Sleep and Anxiety Medications.    Disposition: Home Diet recommendation:  Regular diet DISCHARGE MEDICATION: Allergies as of 06/01/2023       Reactions   Sulfa Antibiotics Hives, Itching, Rash, Swelling   Morphine Other (See Comments)   "Gives me crazy dreams" (delusions, also) Psychosis, also   Sulfonamide Derivatives Hives, Itching, Swelling        Medication List     TAKE these medications    acetaminophen 500 MG tablet Commonly known as: TYLENOL Take 1,000 mg by mouth every 6 (six) hours as needed (for pain/sleep).   allopurinol 300 MG tablet Commonly known as: ZYLOPRIM Take 0.5 tablets (150 mg total) by mouth at bedtime. What changed: how much to take   apixaban 5 MG Tabs tablet Commonly known as: ELIQUIS Take 1 tablet (5 mg total) by mouth 2 (two) times daily.   cholestyramine light 4 g packet Commonly known as: PREVALITE TAKE 1 PACKET (4 G TOTAL) BY MOUTH 3 (THREE) TIMES DAILY AFTER MEALS.   DULoxetine 60 MG capsule Commonly known as: CYMBALTA Take 2 capsules (120 mg total) by mouth daily.   folic acid 1 MG tablet Commonly known as: FOLVITE Take 2 tablets (2 mg total) by mouth daily. What changed: how much to take   Gas-X Extra Strength 125 MG Caps Generic drug: Simethicone Take 125 mg by mouth at bedtime.   loperamide 2 MG tablet Commonly known as: IMODIUM A-D Take 4 mg by mouth in the morning and at bedtime.   metoprolol succinate 100 MG 24 hr tablet Commonly known as: TOPROL-XL Take 100 mg by mouth at bedtime.   OVER THE COUNTER MEDICATION Take 1 tablet by mouth See admin instructions. Nervive Nerve Health tablet- Take 1 tablet by mouth at bedtime   TUMS EXTRA STRENGTH 750 PO Take 1-2 tablets by mouth every 6 (six) hours as needed (CHEW for heartburn).   Vitamin D (Ergocalciferol) 1.25 MG (50000 UNIT) Caps capsule Commonly known as: DRISDOL Take 50,000 Units  by mouth every 30 (thirty) days.        Discharge Exam:   Subjective: No longer with n/v, able to tolerate PO, increased ostomy output but back to baseline.  No new concerns, feels ready for dc.  Port occlusion noted on 9/18 and she would like a plan for this - either prior to dc to for outpatient f/u.   Objective: Vitals:   06/01/23 0512 06/01/23 1233  BP: (!) 147/88 (!) 148/84  Pulse: 86 95  Resp: 18   Temp: 98.1 F (36.7 C) 98.9 F (37.2 C)  SpO2: 97% 97%    Intake/Output Summary (Last 24 hours) at 06/01/2023 1410 Last data filed at 06/01/2023 0544 Gross per 24 hour  Intake 743.39 ml  Output 1100 ml  Net -356.61 ml   There were no vitals filed for this visit.  Exam:  General:  Appears calm and comfortable and is in NAD Eyes:  EOMI, normal lids, iris ENT:  grossly normal hearing, lips & tongue, mmm Neck:  no LAD, masses or thyromegaly Cardiovascular:  RRR, No LE edema.  Respiratory:   CTA bilaterally with no  wheezes/rales/rhonchi.  Normal respiratory effort. Abdomen:  soft, NT, ND; RLQ ostomy with green-brown stool Skin:  no rash or induration seen on limited exam Musculoskeletal:  grossly normal tone BUE/BLE, good ROM, no bony abnormality Psychiatric:  blunted mood and affect, speech fluent and appropriate, AOx3 Neurologic:  CN 2-12 grossly intact, moves all extremities in coordinated fashion, sensation intact  Data Reviewed: I have reviewed the patient's lab results since admission.  Pertinent labs for today include:  CO2 36 BUN 21/Creatinine 2.81/GFR 18, stable Mag++ 1.6 WBC 12.2 Hgb 9.5, 12.8 on 9/21 Urine culture contaminated Blood cultures NTD x 3 days      Condition at discharge: improving  The results of significant diagnostics from this hospitalization (including imaging, microbiology, ancillary and laboratory) are listed below for reference.   Imaging Studies: DG Abd 1 View  Result Date: 05/31/2023 CLINICAL DATA:  Abdominal pain.  Nausea  and vomiting. EXAM: ABDOMEN - 1 VIEW COMPARISON:  CT 05/28/2023. FINDINGS: Supine views x2. Cholecystectomy clips. No gaseous distention of bowel loops. No abnormal abdominal calcifications. No appendicolith. Mild right hemidiaphragm elevation. Mild convex right lumbar spine curvature. IMPRESSION: No acute findings. Electronically Signed   By: Jeronimo Greaves M.D.   On: 05/31/2023 18:46   IR CV Line Injection  Result Date: 05/30/2023 INDICATION: 67 year old female with a history of recently placed port catheter on 04/12/2023. Unfortunately, the catheter has not aspirated well. She presents for port catheter injection and evaluation. EXAM: Catheter injection MEDICATIONS: None. ANESTHESIA/SEDATION: None. FLUOROSCOPY TIME:  Radiation exposure index: 26 mGy reference air kerma COMPLICATIONS: None immediate. PROCEDURE: Informed written consent was obtained from the patient after a thorough discussion of the procedural risks, benefits and alternatives. All questions were addressed. Maximal Sterile Barrier Technique was utilized including caps, mask, sterile gowns, sterile gloves, sterile drape, hand hygiene and skin antiseptic. A timeout was performed prior to the initiation of the procedure. The port catheter was accessed sterilely. 5 mL Omnipaque 300 was diluted in 5 mL saline. Digital subtraction angiography was performed. There is a fibrin sheath limiting flow from the tip of the catheter. The injected contrast material tracks back along the fibrin sheath before spilling out into superior vena cava. The catheter tip is well positioned at the cavoatrial junction. IMPRESSION: Well-positioned right IJ single-lumen power injectable port catheter. Port injection is positive for the presence of a fibrin sheath which is limiting aspiration. Patient will be scheduled for an attempt at fibrin sheath stripping. Electronically Signed   By: Malachy Moan M.D.   On: 05/30/2023 13:24   CT ABDOMEN PELVIS WO CONTRAST  Result  Date: 05/28/2023 CLINICAL DATA:  History of Crohn's disease status post total colectomy and ileostomy with 3 days of nausea and vomiting EXAM: CT ABDOMEN AND PELVIS WITHOUT CONTRAST TECHNIQUE: Multidetector CT imaging of the abdomen and pelvis was performed following the standard protocol without IV contrast. RADIATION DOSE REDUCTION: This exam was performed according to the departmental dose-optimization program which includes automated exposure control, adjustment of the mA and/or kV according to patient size and/or use of iterative reconstruction technique. COMPARISON:  CT abdomen and pelvis dated 01/10/2023 FINDINGS: Lower chest: Partially imaged central venous catheter tip terminates at the superior cavoatrial junction. No focal consolidation or pulmonary nodule in the lung bases. No pleural effusion or pneumothorax demonstrated. Partially imaged heart size is normal. Hepatobiliary: No focal hepatic lesions. No intra or extrahepatic biliary ductal dilation. Cholecystectomy. Pancreas: No focal lesions or main ductal dilation. Spleen: Normal in size without focal abnormality. Adrenals/Urinary  Tract: No adrenal nodules. No suspicious renal mass or hydronephrosis. Punctate nonobstructing left upper pole stone. Decompressed urinary bladder. Stomach/Bowel: Small hiatal hernia. Normal appearance of the stomach. Postsurgical changes from colectomy and right upper quadrant ileostomy. Enteric contrast material is present to the level of the ileostomy. No evidence of bowel wall thickening, distention, or inflammatory changes. Vascular/Lymphatic: Aortic atherosclerosis. No enlarged abdominal or pelvic lymph nodes. Reproductive: Unchanged right adnexal 2.7 cm simple-appearing cyst (2:69). No specific follow-up imaging recommended. No new adnexal masses. Other: No free fluid, fluid collection, or free air. Musculoskeletal: No acute or abnormal lytic or blastic osseous lesions. Multilevel degenerative changes of the partially  imaged thoracic and lumbar spine. Old left rib fractures. Postsurgical changes of the anterior abdominal wall. IMPRESSION: 1. No acute abnormality in the abdomen or pelvis. 2. Postsurgical changes from colectomy and right upper quadrant ileostomy. No evidence of bowel obstruction. 3. Small hiatal hernia. 4.  Aortic Atherosclerosis (ICD10-I70.0). Electronically Signed   By: Agustin Cree M.D.   On: 05/28/2023 16:26    Microbiology: Results for orders placed or performed during the hospital encounter of 05/28/23  Urine Culture     Status: Abnormal   Collection Time: 05/28/23  5:17 PM   Specimen: Urine, Random  Result Value Ref Range Status   Specimen Description   Final    URINE, RANDOM Performed at [facility], [address], [place], [place] [address]    Special Requests   Final    NONE Reflexed from [id number] Performed at [facility], [address] [place], [place] [address]    Culture MULTIPLE SPECIES PRESENT, SUGGEST RECOLLECTION (A)  Final   Report Status 05/29/2023 FINAL  Final  SARS Coronavirus 2 by RT PCR (hospital order, performed in [facility] hospital lab) *cepheid single result test* Anterior Nasal Swab     Status: None   Collection Time: 05/28/23  7:15 PM   Specimen: Anterior Nasal Swab  Result Value Ref Range Status   SARS Coronavirus 2 by RT PCR NEGATIVE NEGATIVE Final    Comment: (NOTE) SARS-CoV-2 target nucleic acids are NOT DETECTED.  The SARS-CoV-2 RNA is generally detectable in upper and lower respiratory specimens during the acute phase of infection. The lowest concentration of SARS-CoV-2 viral copies this assay can detect is 250 copies / mL. A negative result does not preclude SARS-CoV-2 infection and should not be used as the sole basis for treatment or other patient management decisions.  A negative result may occur with improper specimen collection / handling, submission of specimen other than nasopharyngeal swab, presence  of viral mutation(s) within the areas targeted by this assay, and inadequate number of viral copies (<250 copies / mL). A negative result must be combined with clinical observations, patient history, and epidemiological information.  Fact Sheet for Patients:   RoadLapTop.co.za  Fact Sheet for Healthcare Providers: http://kim-miller.com/  This test is not yet approved or  cleared by the [place] FDA and has been authorized for detection and/or diagnosis of SARS-CoV-2 by FDA under an Emergency Use Authorization (EUA).  This EUA will remain in effect (meaning this test can be used) for the duration of the COVID-19 declaration under Section 564(b)(1) of the Act, 21 U.S.C. section 360bbb-3(b)(1), unless the authorization is terminated or revoked sooner.  Performed at [facility], [address], [place], [place] [address]   Blood culture (routine x 2)     Status: None (Preliminary result)   Collection Time: 05/28/23  7:21  PM   Specimen: BLOOD  Result Value Ref Range Status   Specimen Description   Final    BLOOD LEFT ANTECUBITAL Performed at [facility], [address], [place], [place] [address]    Special Requests   Final    BOTTLES DRAWN AEROBIC AND ANAEROBIC Blood Culture adequate volume Performed at [facility], [address], [place], [place] [address]    Culture   Final    NO GROWTH 3 DAYS Performed at [facility], [address] [place], [place] [address]    Report Status PENDING  Incomplete  Blood culture (routine x 2)     Status: None (Preliminary result)   Collection Time: 05/28/23  7:50 PM   Specimen: BLOOD  Result Value Ref Range Status   Specimen Description   Final    BLOOD RIGHT ANTECUBITAL Performed at [facility], [address], [place], [place] [address]    Special Requests   Final    BOTTLES DRAWN AEROBIC AND ANAEROBIC Blood Culture adequate  volume Performed at [facility], [address], [place], [place] [address]    Culture   Final    NO GROWTH 3 DAYS Performed at [facility], [address] [place], [place] [address]    Report Status PENDING  Incomplete  C Difficile Quick Screen w PCR reflex     Status: None   Collection Time: 05/29/23  7:45 PM   Specimen: STOOL  Result Value Ref Range Status   C Diff antigen NEGATIVE NEGATIVE Final   C Diff toxin NEGATIVE NEGATIVE Final   C Diff interpretation No C. difficile detected.  Final    Comment: Performed at [facility], [address] [place], [place] [address]  Gastrointestinal Panel by PCR , Stool     Status: None   Collection Time: 05/29/23  7:45 PM   Specimen: STOOL  Result Value Ref Range Status   Campylobacter species NOT DETECTED NOT DETECTED Final   Plesimonas shigelloides NOT DETECTED NOT DETECTED Final   Salmonella species NOT DETECTED NOT DETECTED Final   Yersinia enterocolitica NOT DETECTED NOT DETECTED Final   Vibrio species NOT DETECTED NOT DETECTED Final   Vibrio cholerae NOT DETECTED NOT DETECTED Final   Enteroaggregative E coli (EAEC) NOT DETECTED NOT DETECTED Final   Enteropathogenic E coli (EPEC) NOT DETECTED NOT DETECTED Final   Enterotoxigenic E coli (ETEC) NOT DETECTED NOT DETECTED Final   Shiga like toxin producing E coli (STEC) NOT DETECTED NOT DETECTED Final   Shigella/Enteroinvasive E coli (EIEC) NOT DETECTED NOT DETECTED Final   Cryptosporidium NOT DETECTED NOT DETECTED Final   Cyclospora cayetanensis NOT DETECTED NOT DETECTED Final   Entamoeba histolytica NOT DETECTED NOT DETECTED Final   Giardia lamblia NOT DETECTED NOT DETECTED Final   Adenovirus F40/41 NOT DETECTED NOT DETECTED Final   Astrovirus NOT DETECTED NOT DETECTED Final   Norovirus GI/GII NOT DETECTED NOT DETECTED Final   Rotavirus A NOT DETECTED NOT DETECTED Final   Sapovirus (I, II, IV, and V) NOT DETECTED NOT DETECTED Final     Comment: Performed at [facility], [address] [place], [place] [address]   *Note: Due to a large number of results and/or encounters for the requested time period, some results have not been displayed. A complete set of results can be found in Results Review.    Labs: CBC: Recent Labs  Lab 05/28/23 1227 05/29/23 3220 05/30/23 2542  05/31/23 0549 06/01/23 0543  WBC 19.3* 16.5* 12.5* 12.8* 12.2*  NEUTROABS  --   --  7.9* 7.7 7.0  HGB 12.8 11.0* 9.8* 9.6* 9.5*  HCT 40.1 33.6* 29.7* 29.2* 30.7*  MCV 103.6* 102.8* 102.1* 101.7* 106.6*  PLT 411* 317 290 287 276   Basic Metabolic Panel: Recent Labs  Lab 05/28/23 1227 05/29/23 0642 05/30/23 0516 05/31/23 0549 06/01/23 0543  NA 135 134* 133* 139 136  K 3.9 3.0* 3.3* 3.2* 4.0  CL 111 104 96* 91* 90*  CO2 16* 17* 24 36* 36*  GLUCOSE 145* 159* 148* 141* 126*  BUN 49* 50* 36* 25* 21  CREATININE 3.68* 3.38* 3.21* 2.76* 2.81*  CALCIUM 8.4* 8.4* 7.9* 8.1* 8.2*  MG 1.1* 1.9 1.8 2.0 1.6*  PHOS  --   --  4.0 3.1 2.7   Liver Function Tests: Recent Labs  Lab 05/28/23 1227 05/29/23 0642 05/30/23 0516 05/31/23 0549 06/01/23 0543  AST 20 16 16 21 26   ALT 26 19 18 20 22   ALKPHOS 145* 117 109 116 118  BILITOT 0.5 0.6 0.3 0.5 0.4  PROT 7.6 6.3* 6.0* 6.2* 6.3*  ALBUMIN 3.3* 2.6* 2.5* 2.7* 2.6*   CBG: No results for input(s): "GLUCAP" in the last 168 hours.  Discharge time spent: greater than 30 minutes.  Signed: Jonah Blue, MD [facility] 06/01/2023

## 2023-06-01 NOTE — Consult Note (Signed)
[facility] Inpatient Consult   06/01/2023  Floria Rowlee May 21, 1956 [id number]  Value[facility] [[facility]]  Accountable Care Organization [ACO] Patient: [facility] Medicare  [facility] Liaison remote coverage review for patient admitted to [facility]    Primary Care Provider:  Zola Button, Grayling Congress, DO with [place] [facility] [place] is listed to provide the transition of care follow up  Patient is currently active with [facility] [[facility]] Care Management for care coordination services.  Patient has been engaged by a [employer].  The community based plan of care has focused on disease management and community resource support.   Call attempt to phone number listed but goes straight to voicemail.  Plan: Update Community RN CC of hospitalization   Of note, [facility] Care Management services does not replace or interfere with any services that are needed or arranged by inpatient [facility] care management team.   Charlesetta Shanks, RN, BSN, CCM   [facility], [facility] Liaison Direct Dial: [phone] or secure chat Website: [email or web address]

## 2023-06-02 LAB — CULTURE, BLOOD (ROUTINE X 2)
Culture: NO GROWTH
Culture: NO GROWTH
Special Requests: ADEQUATE
Special Requests: ADEQUATE

## 2023-06-03 ENCOUNTER — Ambulatory Visit (INDEPENDENT_AMBULATORY_CARE_PROVIDER_SITE_OTHER): Payer: Medicare Other

## 2023-06-03 ENCOUNTER — Encounter: Payer: Self-pay | Admitting: Gastroenterology

## 2023-06-03 ENCOUNTER — Ambulatory Visit: Payer: Medicare Other | Admitting: Gastroenterology

## 2023-06-03 VITALS — BP 123/78 | HR 103 | Temp 97.7°F | Resp 24 | Ht 63.0 in | Wt 188.4 lb

## 2023-06-03 VITALS — BP 120/80 | HR 102 | Ht 63.0 in | Wt 191.0 lb

## 2023-06-03 DIAGNOSIS — R198 Other specified symptoms and signs involving the digestive system and abdomen: Secondary | ICD-10-CM

## 2023-06-03 DIAGNOSIS — E860 Dehydration: Secondary | ICD-10-CM | POA: Diagnosis not present

## 2023-06-03 DIAGNOSIS — Z932 Ileostomy status: Secondary | ICD-10-CM | POA: Diagnosis not present

## 2023-06-03 DIAGNOSIS — K50819 Crohn's disease of both small and large intestine with unspecified complications: Secondary | ICD-10-CM

## 2023-06-03 MED ORDER — LOPERAMIDE HCL 2 MG PO TABS: 2.0000 mg | ORAL_TABLET | Freq: Three times a day (TID) | ORAL | Status: DC

## 2023-06-03 MED ORDER — METAMUCIL PREMIUM BLEND 52.63 % PO POWD: ORAL | Status: DC

## 2023-06-03 MED ORDER — HEPARIN SOD (PORK) LOCK FLUSH 100 UNIT/ML IV SOLN
500.0000 [IU] | Freq: Once | INTRAVENOUS | Status: AC | PRN
  Administered 2023-06-03: 500 [IU]

## 2023-06-03 MED ORDER — SODIUM CHLORIDE 0.9 % IV BOLUS
1000.0000 mL | Freq: Once | INTRAVENOUS | Status: AC
  Administered 2023-06-03: 1000 mL via INTRAVENOUS
  Filled 2023-06-03: qty 1000

## 2023-06-03 NOTE — Progress Notes (Signed)
Diagnosis: Dehydration  Provider:  Chilton Greathouse MD  Procedure: IV Infusion  IV Type: Port a Cath, IV Location: R Chest  Normal Saline, Dose: 1000 ml  Infusion Start Time: 0831  Infusion Stop Time: 0936  Post Infusion IV Care: Patient declined observation and Port a Cath Deaccessed/Flushed and heparin locked  Discharge: Condition: Good, Destination: Home . AVS Declined  Performed by:  Wyvonne Lenz, RN

## 2023-06-03 NOTE — Progress Notes (Signed)
HPI :  IBD history: 67 year old white female with IBD ( Crohn's) since 50.  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 She was never able to be put in remission prior to colectomy. She was only exposed to steroids prior to her surgery. Post-operatively, she was on Remicade for 3-4 years but did not help too much. She had been on remotely but did not work for her. She was on Humira but also did not put her into remission and she developed high antibody titers and it was stopped. Most recently on Entyvio and low dose . was stopped due to worsening anemia.  She reports multiple courses of antibiotics with some benefit historically.  She has been on budesonider with ? Relief. Father and daughter have UC.  She has seen Riccardo Dubin and Dr. 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 Most recently saw Dr. Inetta Fermo at [facility] and had diverting ileostomy April 23 2021. Returned for J pouch excision Jan 2023.   SINCE THE LAST VISIT   67 year old female here for follow-up visit.  Recall she has had problems with increased ostomy output over time associated with nausea and vomiting, she has had IV fluid infusions Monday Wednesday Friday as outpatient to prevent dehydration.  She has CKD, following with her nephrologist closely, she has had some worsening kidney function over time and they are trying to keep her off dialysis.  She was last admitted to the hospital this past week for a few days in the setting of UTI and increased ostomy output.  At her last visit we had started her on octreotide Depo.  She states she has been using this 20 mg once monthly since then and does think it has helped somewhat.  She does wish to continue this.  We had previously had her on some cholestyramine as she thought this had really helped her in the past and both her stools.   We had discussed that this can sometimes make the situation worse in a patient with ileostomy, she continued at the time.  She is not taking Imodium routinely, we had discussed having her take it on a scheduled basis but she has not done that.  She states she has intermittent high output from her ostomy that can be variable.  No blood in her stools.  She is not having any abdominal pains.  She does states she is eating well, although eats a lot of "junk food".  She has has associated fatigue.  She has had a chronic anemia associated with her CKD, hemoglobin is in the nines to tens, iron studies and B12 look okay.  She had a workup a year ago with ileoscopy and fecal calprotectin that showed no evidence of Crohn's disease.  She had a CT scan last week which showed no active inflammation in her bowel or concern for active Crohn's disease      Prior workup: Pouchoscopy 03/31/17 - anal fissure, mild anal canal stenosis, focal ulceration of pouch and more proximal ileum but improved compared to previous   Pouchoscopy 01/21/20 -  Preparation of the colon was poor. - A suspected small perianal fistulous tract and anal canal stenosis found on digital rectal exam. - The ileal pouch has an angulated turn with very focal ulcerations, otherwise no inflammatory changes noted. biopsies obtained - Small area of nodularity  along anal canal - biopsied to rule out AIN / condyloma - Suspected fistal of the distal pouch just proximal to anal canal Overall, only one focal ulceration noted and no other inflammatory changes seen. Incidentally noted suspected new fistula   EGD 01/21/20 -  - A 3 cm hiatal hernia was present. - LA Grade A esophagitis was found 33 cm from the incisors - mild. - The exam of the esophagus was otherwise normal. No Barrett's esophagus. - Multiple small sessile polyps were found in the gastric fundus and in the gastric body, benign appearing, likely fundic gland polyps, a cluster was noted  at the distal end of the hernia sac. Biopsies were taken with a cold forceps for histology. - The exam of the stomach was otherwise normal. - The duodenal bulb and second portion of the duodenum were normal.   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 Cellulitis extends superiorly throughout the posterior subcutaneous fat, adjacent to the gluteal and posterior paraspinal muscles. No evidence of abscess.     MRE 06/25/20 - IMPRESSION: 1. Persistent inflammatory changes near the upper gluteal cleft bilaterally between the gluteus maximus muscles. No discrete drainable soft tissue abscess or intramuscular involvement. 2. No MR findings for acute inflammatory small-bowel disease. However, study is limited without contrast.   CT scan abdomen/ pelvis without contrast 05/01/21: IMPRESSION: 1. Findings worrisome for an area of focal bowel perforation versus anastomosis leak within the anterior aspect of the mid abdomen, with extravasation of air and contrast into the adjacent surgical incision site. 2. Postoperative changes within subsequent right lower quadrant ileostomy. 3. Aortic atherosclerosis.   CT scan abdomen / pelvis without contrast 05/14/21: IMPRESSION: Status post proctocolectomy. Ileostomy is noted in right lower quadrant. There is no evidence of bowel obstruction seen at this time.   No definite acute abnormality is seen in the abdomen or pelvis at this time.     CT abdomen / pelvis without contrast 09/22/21: IMPRESSION: 1. Patient is status post colectomy with right lower quadrant ileostomy. Small bowel loops in the upper abdomen and stomach appeared dilated concerning for small bowel obstruction. Transition point is not definitive, but may be at the level of the ostomy. No free air or pneumatosis. 2. There is  wall thickening of the distal esophagus and proximal stomach worrisome for gastritis and esophagitis. 3. Small amount of air in the pelvis is unchanged from prior, likely postoperative. 4. Nonobstructing left renal calculus     CT abdomen / pelvis without contrast 06/29/22: IMPRESSION: Status post colectomy with ileostomy seen in right lower quadrant. No abnormal bowel dilatation or inflammation is noted. No acute abnormality seen in the abdomen or pelvis.     Ileoscopy 07/20/22: normal   Small bowel follow through 07/21/22: IMPRESSION: Right mid abdominal ileostomy. No findings to indicate disease progression.     ESR and CRP normal on 07/21/22   Fecal calprotectin 07/19/22 - 5     CT abdomen / pelvis 05/28/23: IMPRESSION: 1. No acute abnormality in the abdomen or pelvis. 2. Postsurgical changes from colectomy and right upper quadrant ileostomy. No evidence of bowel obstruction. 3. Small hiatal hernia. 4.  Aortic Atherosclerosis (ICD10-I70.0).   Past Medical History:  Diagnosis Date   Abdominal pain    Hx   Acute pyelonephritis 12/07/2020   Allergy    Anal stenosis    Anemia  Anxiety    Arthritis    Asthma    patient does not have inhaler   Blood in stool    Hx   Blood in urine    Hx   Blood transfusion without reported diagnosis    Cataract    CKD (chronic kidney disease) stage 3, GFR 30-59 ml/min (HCC)    Crohn's colitis, other complication (HCC)    De Quervain's tenosynovitis    Depression    Difficulty urinating    Hx   Easy bruising    Esophagitis    Fainting    History - resolved - due to dehydration   Fatigue    Hx   Fibroid    Hx   Gastric polyp    GERD (gastroesophageal reflux disease)    Hearing loss    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   Hemorrhoids, external    Hemorrhoids, internal    Herpes, genital    vaginal treated 07/05/12 and pt states is resolved   History of cervical dysplasia    History of small bowel obstruction     Hyperlipidemia    currently no meds   Hyperparathyroidism    Hypertension    Hypokalemia    Hx   Hypotension 06/29/2022   IBD (inflammatory bowel disease)    initially colectomy for suspected UC, 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   Obesity    Ovarian cyst    Pain in joint, pelvic region and thigh 12/16/2008   Centricity Description: HIP PAIN, LEFT  Qualifier: Diagnosis of   By: Janit Bern      Centricity Description: HIP PAIN  Qualifier: Diagnosis of   By: Janit Bern       Pain of right thumb 04/12/2018   Poor dental hygiene    Pulmonary nodule, right    right upper lobe   Rectal bleeding    Hx   Rectal pain    Hx   Renal insufficiency    CKD - stage 3   RLS (restless legs syndrome)    no meds   Sepsis secondary to UTI (HCC) 06/29/2022   Severe sepsis (HCC) 12/07/2020   Tooth infection 11/2016   right low   Ulcerative colitis    Visual disturbance    wears glasses   Weakness generalized    Hx - patient denies generalized weakness   Wears dentures    upper only     Past Surgical History:  Procedure Laterality Date   ANAL DILATION     BIOPSY  07/20/2022   Procedure: BIOPSY;  Surgeon: Meryl Dare, MD;  Location: [facility];  Service: Gastroenterology;;   CERVICAL BIOPSY  W/ LOOP ELECTRODE EXCISION     CHOLECYSTECTOMY     COLONOSCOPY     [name]   fatty tumor removed from back     X 2   HEMORRHOID SURGERY     ILEOSCOPY N/A 07/20/2022   Procedure: ILEOSCOPY THROUGH STOMA;  Surgeon: Meryl Dare, MD;  Location: [facility];  Service: Gastroenterology;  Laterality: N/A;   ILEOSTOMY CLOSURE     IR CV LINE INJECTION  05/30/2023   IR IMAGING GUIDED PORT INSERTION  04/12/2023   RESTORATIVE PROCTOCOLECTOMY     with insertion of ileoanal J Pouch with loop ileostomy   SHOULDER ARTHROSCOPY WITH CAPSULORRHAPHY Left 06/14/2019   Procedure: LEFT SHOULDER ARTHRSCOPIC REPAIR OF BONY BANKART FRACTURE;   Surgeon: Ave Filter,  Jill Alexanders, MD;  Location: [facility];  Service: Orthopedics;  Laterality: Left;  REQUEST 90 MINUTE   SIGMOIDOSCOPY     TOTAL ABDOMINAL HYSTERECTOMY  1998   TAH/LSO   TUBAL LIGATION     UPPER GASTROINTESTINAL ENDOSCOPY     [name]   Family History  Problem Relation Age of Onset   Hypertension Mother    Heart disease Mother        s/p pci   Ulcerative colitis Father    Hypertension Father    Heart attack Father    Diabetes Sister    Cancer Sister        uterine   Pulmonary fibrosis Sister    Cancer Maternal Uncle        LUNG   Ulcerative colitis Daughter    Liver disease Daughter    Irritable bowel syndrome Other        grandchildren   Colon cancer Neg Hx    Esophageal cancer Neg Hx    Stomach cancer Neg Hx    Rectal cancer Neg Hx    Social History   Tobacco Use   Smoking status: Former    Current packs/day: 0.00    Average packs/day: 1 pack/day for 4.0 years (4.0 ttl pk-yrs)    Types: Cigarettes    Start date: 05/21/1975    Quit date: 05/21/1979    Years since quitting: 44.0   Smokeless tobacco: Never  Vaping Use   Vaping status: Never Used  Substance Use Topics   Alcohol use: No    Alcohol/week: 0.0 standard drinks of alcohol   Drug use: No   Current Outpatient Medications  Medication Sig Dispense Refill   acetaminophen (TYLENOL) 500 MG tablet Take 1,000 mg by mouth every 6 (six) hours as needed (for pain/sleep).     allopurinol (ZYLOPRIM) 300 MG tablet Take 0.5 tablets (150 mg total) by mouth at bedtime. (Patient taking differently: Take 300 mg by mouth at bedtime.) 90 tablet 1   apixaban (ELIQUIS) 5 MG TABS tablet Take 1 tablet (5 mg total) by mouth 2 (two) times daily. 180 tablet 0   Calcium Carbonate Antacid (TUMS EXTRA STRENGTH 750 PO) Take 1-2 tablets by mouth every 6 (six) hours as needed (CHEW for heartburn).     cholestyramine light (PREVALITE) 4 g packet TAKE 1 PACKET (4 G TOTAL) BY MOUTH 3 (THREE) TIMES DAILY AFTER MEALS. 42 packet 1    DULoxetine (CYMBALTA) 60 MG capsule Take 2 capsules (120 mg total) by mouth daily. 60 capsule 11   folic acid (FOLVITE) 1 MG tablet Take 2 tablets (2 mg total) by mouth daily. (Patient taking differently: Take 1 mg by mouth daily.) 180 tablet 3   GAS-X EXTRA STRENGTH 125 MG CAPS Take 125 mg by mouth at bedtime.     loperamide (IMODIUM A-D) 2 MG tablet Take 4 mg by mouth in the morning and at bedtime.     metoprolol succinate (TOPROL-XL) 100 MG 24 hr tablet Take 100 mg by mouth at bedtime.     OVER THE COUNTER MEDICATION Take 1 tablet by mouth See admin instructions. Nervive Nerve Health tablet- Take 1 tablet by mouth at bedtime     Vitamin D, Ergocalciferol, (DRISDOL) 1.25 MG (50000 UNIT) CAPS capsule Take 50,000 Units by mouth every 30 (thirty) days.     No current facility-administered medications for this visit.   Facility-Administered Medications Ordered in Other Visits  Medication Dose Route Frequency Provider Last Rate Last Admin   sodium chloride 0.9 %  bolus 1,000 mL  1,000 mL Intravenous Once Terrial Rhodes, MD       Allergies  Allergen Reactions   Sulfa Antibiotics Hives, Itching, Rash and Swelling   Morphine Other (See Comments)    "Gives me crazy dreams" (delusions, also) Psychosis, also    Sulfonamide Derivatives Hives, Itching and Swelling     Review of Systems: All systems reviewed and negative except where noted in HPI.   Lab Results  Component Value Date   NA 136 06/01/2023   CL 90 (L) 06/01/2023   K 4.0 06/01/2023   CO2 36 (H) 06/01/2023   BUN 21 06/01/2023   CREATININE 2.81 (H) 06/01/2023   GFRNONAA 18 (L) 06/01/2023   CALCIUM 8.2 (L) 06/01/2023   PHOS 2.7 06/01/2023   ALBUMIN 2.6 (L) 06/01/2023   GLUCOSE 126 (H) 06/01/2023    Lab Results  Component Value Date   WBC 12.2 (H) 06/01/2023   HGB 9.5 (L) 06/01/2023   HCT 30.7 (L) 06/01/2023   MCV 106.6 (H) 06/01/2023   PLT 276 06/01/2023    Lab Results  Component Value Date   ALT 22 06/01/2023    AST 26 06/01/2023   ALKPHOS 118 06/01/2023   BILITOT 0.4 06/01/2023     Physical Exam: BP 120/80   Pulse (!) 102   Ht 5\' 3"  (1.6 m)   Wt 191 lb (86.6 kg)   SpO2 96%   BMI 33.83 kg/m  Constitutional: Pleasant,well-developed, female in no acute distress. Neurological: Alert and oriented to person place and time. Psychiatric: Normal mood and affect. Behavior is normal.   ASSESSMENT: 67 y.o. female here for assessment of the following  1. High output ileostomy (HCC)   2. Crohn's disease of small and large intestines with complication (HCC)    History of Crohn's disease status post colectomy, with J-pouch excision, now with permanent ileostomy as she was having significant incontinence previously.  She has had ongoing issues with high ostomy output leading to dehydration. Prior workup for this with CT scan, labs, fecal calprotectin, ileoscopy showed her Crohn's is not active, no inflammation.   I last saw her in February and she continues to have some high output from the ostomy.  Recently admitted last week in the setting of a UTI.  We discussed options.  She does think the octreotide has helped subjectively although she continues to have these episodes that are difficult to control.  If she finds it helpful we will continue it for now.  She had reported cholestyramine has helped her in the past so we continue that however with her ileostomy and persistent symptoms I think this may be causing more harm than good at this point and recommend we stop it altogether.  She is agreeable with that.  We will try to bulk her stools with psyllium husk which she is agreeable to.  Otherwise, she really has not been taking her Imodium as scheduled.  I counseled her that I think she would really benefit from scheduled Imodium multiple times per day and she can titrate that up as needed.  She needs to be more proactive in trying to control this.  I am very concerned about her progression of kidney disease and  trying to keep her off dialysis.  She understands this.  Otherwise, we will send her back for another fecal calprotectin given it has been several months since her last 1, just to make sure nothing active although her CT scan is reassuring that there is no active  Crohn's at this time.  PLAN: - continue octreotide as scheduled monthly - stop cholesytramine  - start psyllium husk daily - start immodium every day - TID and titrate up as needed - f/u 6 months or sooner with issues - call me if no improvement in her regimen so we can adjust as needed  Harlin Rain, MD [facility]

## 2023-06-03 NOTE — Patient Instructions (Addendum)
If your blood pressure at your visit was 140/90 or greater, please contact your primary care physician to follow up on this. ______________________________________________________  If you are age 69 or older, your body mass index should be between 23-30. Your Body mass index is 33.83 kg/m. If this is out of the aforementioned range listed, please consider follow up with your Primary Care Provider.  If you are age 21 or younger, your body mass index should be between 19-25. Your Body mass index is 33.83 kg/m. If this is out of the aformentioned range listed, please consider follow up with your Primary Care Provider.  ________________________________________________________  [name] [facility] providers would like to encourage you to use [facility] to communicate with providers for non-urgent requests or questions.  Due to long hold times on the telephone, sending your provider a message by [facility] may be a faster and more efficient way to get a response.  Please allow 48 business hours for a response.  Please remember that this is for non-urgent requests.  _______________________________________________________  Due to recent changes in healthcare laws, you may see the results of your imaging and laboratory studies on MyChart before your provider has had a chance to review them.  We understand that in some cases there may be results that are confusing or concerning to you. Not all laboratory results come back in the same time frame and the provider may be waiting for multiple results in order to interpret others.  Please give [place] 48 hours in order for your provider to thoroughly review all the results before contacting the office for clarification of your results.   Continue Octreotide  Stop Cholestyramine  Please purchase the following medications over the counter and take as directed: Metamucil: Take daily Imodium: Take three times a day and titrate as needed  Thank you for entrusting me with your  care and for choosing [facility], Dr. Ileene Patrick

## 2023-06-06 ENCOUNTER — Ambulatory Visit (INDEPENDENT_AMBULATORY_CARE_PROVIDER_SITE_OTHER): Payer: Medicare Other

## 2023-06-06 VITALS — BP 139/83 | HR 91 | Temp 98.0°F | Resp 18 | Ht 63.0 in | Wt 189.8 lb

## 2023-06-06 DIAGNOSIS — E860 Dehydration: Secondary | ICD-10-CM

## 2023-06-06 MED ORDER — HEPARIN SOD (PORK) LOCK FLUSH 100 UNIT/ML IV SOLN
500.0000 [IU] | Freq: Once | INTRAVENOUS | Status: AC | PRN
  Administered 2023-06-06: 500 [IU]

## 2023-06-06 MED ORDER — SODIUM CHLORIDE 0.9 % IV BOLUS
1000.0000 mL | Freq: Once | INTRAVENOUS | Status: AC
  Administered 2023-06-06: 1000 mL via INTRAVENOUS

## 2023-06-06 NOTE — Progress Notes (Signed)
Diagnosis: Dehydration  Provider:  Chilton Greathouse MD  Procedure: IV Infusion  IV Type: Port a Cath, IV Location: R Chest  Normal Saline, Dose: 1000 ml  Infusion Start Time: 0824  Infusion Stop Time: 0933  Post Infusion IV Care: Port a Cath Deaccessed/Flushed  Discharge: Condition: Good, Destination: Home . AVS Declined  Performed by:  Garnette Czech, RN

## 2023-06-07 ENCOUNTER — Telehealth: Payer: Self-pay | Admitting: *Deleted

## 2023-06-07 NOTE — Progress Notes (Signed)
Care Coordination Note  06/07/2023 Name: [name] MRN: [id number] DOB: Aug 14, 1956  [name] is a 67 y.o. year old female who is a primary care patient of [name], DO and is actively engaged with the care management team. I reached out to [name] by phone today to assist with re-scheduling a follow up visit with the RN Case Manager  Follow up plan: Unsuccessful telephone outreach attempt made. A HIPAA compliant phone message was left for the patient providing contact information and requesting a return call.   Burman Nieves, CCMA Care Coordination Care Guide Direct Dial: [phone]

## 2023-06-08 ENCOUNTER — Ambulatory Visit: Payer: Medicare Other

## 2023-06-08 VITALS — BP 144/87 | HR 89 | Temp 97.7°F | Resp 18 | Ht 63.0 in | Wt 189.8 lb

## 2023-06-08 DIAGNOSIS — E860 Dehydration: Secondary | ICD-10-CM | POA: Diagnosis not present

## 2023-06-08 MED ORDER — HEPARIN SOD (PORK) LOCK FLUSH 100 UNIT/ML IV SOLN
500.0000 [IU] | Freq: Once | INTRAVENOUS | Status: AC | PRN
  Administered 2023-06-08: 500 [IU]
  Filled 2023-06-08: qty 5

## 2023-06-08 MED ORDER — SODIUM CHLORIDE 0.9 % IV BOLUS
1000.0000 mL | Freq: Once | INTRAVENOUS | Status: AC
  Administered 2023-06-08: 1000 mL via INTRAVENOUS
  Filled 2023-06-08: qty 1000

## 2023-06-08 NOTE — Progress Notes (Signed)
Diagnosis: Dehydration  Provider:  Chilton Greathouse MD  Procedure: IV Infusion  IV Type: Port a Cath, IV Location: R Chest  Normal Saline, Dose: 1000 ml  Infusion Start Time: 0829  Infusion Stop Time: 0932  Post Infusion IV Care: Patient declined observation and Port a Cath Deaccessed/Flushed and heparin locked  Discharge: Condition: Good, Destination: Home . AVS Declined  Performed by:  Wyvonne Lenz, RN

## 2023-06-10 ENCOUNTER — Ambulatory Visit: Payer: Medicare Other

## 2023-06-10 VITALS — BP 165/89 | HR 90 | Temp 97.6°F | Resp 18 | Ht 63.0 in | Wt 189.8 lb

## 2023-06-10 DIAGNOSIS — E860 Dehydration: Secondary | ICD-10-CM

## 2023-06-10 DIAGNOSIS — Z932 Ileostomy status: Secondary | ICD-10-CM | POA: Diagnosis not present

## 2023-06-10 DIAGNOSIS — R198 Other specified symptoms and signs involving the digestive system and abdomen: Secondary | ICD-10-CM

## 2023-06-10 DIAGNOSIS — K50819 Crohn's disease of both small and large intestine with unspecified complications: Secondary | ICD-10-CM | POA: Diagnosis not present

## 2023-06-10 MED ORDER — HEPARIN SOD (PORK) LOCK FLUSH 100 UNIT/ML IV SOLN
500.0000 [IU] | Freq: Once | INTRAVENOUS | Status: AC | PRN
  Administered 2023-06-10: 500 [IU]
  Filled 2023-06-10: qty 5

## 2023-06-10 MED ORDER — SODIUM CHLORIDE 0.9 % IV BOLUS
1000.0000 mL | Freq: Once | INTRAVENOUS | Status: AC
  Administered 2023-06-10: 1000 mL via INTRAVENOUS

## 2023-06-10 MED ORDER — OCTREOTIDE ACETATE 20 MG IM KIT
20.0000 mg | PACK | Freq: Once | INTRAMUSCULAR | Status: AC
  Administered 2023-06-10: 20 mg via INTRAMUSCULAR
  Filled 2023-06-10: qty 1

## 2023-06-10 NOTE — Progress Notes (Signed)
Diagnosis: Dehydration  Provider:  Chilton Greathouse MD  Procedure: IV Infusion  IV Type: Port a Cath, IV Location: R Chest  Normal Saline, Dose: 1000 ml  Infusion Start Time: 0832  Infusion Stop Time: -0954   Procedure: Injection  Octreotide, Dose: 20 mg, Site: intramuscular, Number of injections: 1  Post Care: Observation period completed  Discharge: Condition: Good, Destination: Home . AVS Declined    Post Infusion IV Care: Port a Cath Deaccessed/Flushed  Discharge: Condition: Good, Destination: Home . AVS Declined  Performed by:  Adriana Mccallum, RN

## 2023-06-13 ENCOUNTER — Ambulatory Visit: Payer: Medicare Other

## 2023-06-13 VITALS — BP 139/83 | HR 95 | Temp 98.0°F | Resp 18 | Ht 63.0 in | Wt 186.0 lb

## 2023-06-13 DIAGNOSIS — E860 Dehydration: Secondary | ICD-10-CM

## 2023-06-13 MED ORDER — HEPARIN SOD (PORK) LOCK FLUSH 100 UNIT/ML IV SOLN
500.0000 [IU] | Freq: Once | INTRAVENOUS | Status: AC | PRN
  Administered 2023-06-13: 500 [IU]
  Filled 2023-06-13: qty 5

## 2023-06-13 MED ORDER — SODIUM CHLORIDE 0.9 % IV BOLUS
1000.0000 mL | Freq: Once | INTRAVENOUS | Status: AC
  Administered 2023-06-13: 1000 mL via INTRAVENOUS

## 2023-06-13 NOTE — Progress Notes (Signed)
Diagnosis: Dehydration  Provider:  Chilton Greathouse MD  Procedure: IV Infusion  IV Type: Port a Cath, IV Location: R Chest  Normal Saline, Dose: 1000 ml  Infusion Start Time: 0836  Infusion Stop Time: 0945  Post Infusion IV Care: Port a Cath Deaccessed/Flushed and heparin locked.  Discharge: Condition: Good, Destination: Home . AVS Declined  Performed by:  Garnette Czech, RN

## 2023-06-14 NOTE — Progress Notes (Signed)
Care Coordination Note  06/14/2023 Name: [name] MRN: [id number] DOB: [birth year]  Chales Abrahams [name] is a 67 y.o. year old female who is a primary care patient of [name], DO and is actively engaged with the care management team. I reached out to [name] by phone today to assist with re-scheduling a follow up visit with the RN Case Manager  Follow up plan: We have been unable to make contact with the patient for follow up.   Burman Nieves, CCMA Care Coordination Care Guide Direct Dial: [phone]

## 2023-06-15 ENCOUNTER — Ambulatory Visit: Payer: Self-pay

## 2023-06-15 ENCOUNTER — Ambulatory Visit: Payer: Medicare Other

## 2023-06-15 VITALS — BP 130/75 | HR 102 | Temp 97.9°F | Resp 20 | Ht 63.0 in | Wt 186.2 lb

## 2023-06-15 DIAGNOSIS — E860 Dehydration: Secondary | ICD-10-CM

## 2023-06-15 MED ORDER — SODIUM CHLORIDE 0.9 % IV BOLUS
1000.0000 mL | Freq: Once | INTRAVENOUS | Status: AC
  Administered 2023-06-15: 1000 mL via INTRAVENOUS
  Filled 2023-06-15: qty 1000

## 2023-06-15 MED ORDER — HEPARIN SOD (PORK) LOCK FLUSH 100 UNIT/ML IV SOLN
500.0000 [IU] | Freq: Once | INTRAVENOUS | Status: AC | PRN
  Administered 2023-06-15: 500 [IU]
  Filled 2023-06-15: qty 5

## 2023-06-15 NOTE — Progress Notes (Signed)
Diagnosis: Dehydration  Provider:  Chilton Greathouse MD  Procedure: IV Infusion  IV Type: Port a Cath, IV Location: R Chest  Normal Saline, Dose: 1000 ml  Infusion Start Time: 0820  Infusion Stop Time: 0925  Post Infusion IV Care: Port a Cath Deaccessed/Flushed  Discharge: Condition: Good, Destination: Home . AVS Declined  Performed by:  Nat Math, RN

## 2023-06-15 NOTE — Patient Outreach (Signed)
Care Coordination Case Closed  Visit Note   06/15/2023 Name: [name] MRN: [id number] DOB: [birth year]  Chales Abrahams [name] is a 67 y.o. year old female who sees Zola Button, Grayling Congress, DO for primary care.   Received notification per care guide unsuccessful outreach attempts to schedule follow up. Case closed  Goals Addressed             This Visit's Progress    COMPLETED: Care Coordination Activities       Interventions Today    Flowsheet Row Most Recent Value  General Interventions   General Interventions Discussed/Reviewed General Interventions Reviewed  [unsuccefful outreach attempts to reschedule follow up-case closed]            SDOH assessments and interventions completed:  No  Care Coordination Interventions:  No, not indicated   Follow up plan: No further intervention required.   Encounter Outcome:  Patient Visit Completed   Kathyrn Sheriff, RN, MSN, BSN, CCM Care Management Coordinator [phone]

## 2023-06-17 ENCOUNTER — Ambulatory Visit: Payer: Medicare Other

## 2023-06-17 VITALS — BP 105/69 | HR 95 | Temp 97.8°F | Resp 16 | Ht 63.0 in | Wt 186.8 lb

## 2023-06-17 DIAGNOSIS — E860 Dehydration: Secondary | ICD-10-CM

## 2023-06-17 MED ORDER — HEPARIN SOD (PORK) LOCK FLUSH 100 UNIT/ML IV SOLN
500.0000 [IU] | Freq: Once | INTRAVENOUS | Status: AC | PRN
  Administered 2023-06-17: 500 [IU]

## 2023-06-17 MED ORDER — SODIUM CHLORIDE 0.9 % IV BOLUS
1000.0000 mL | Freq: Once | INTRAVENOUS | Status: AC
  Administered 2023-06-17: 1000 mL via INTRAVENOUS

## 2023-06-17 NOTE — Progress Notes (Signed)
Diagnosis: Dehydration  Provider:  Chilton Greathouse MD  Procedure: IV Infusion  IV Type: Port a Cath, IV Location: R Chest  Normal Saline, Dose: 1000 ml  Infusion Start Time: 0827  Infusion Stop Time: 0942  Post Infusion IV Care: Patient declined observation and Port a Cath Deaccessed/Flushed and heparin locked  Discharge: Condition: Good, Destination: Home . AVS Declined  Performed by:  Wyvonne Lenz, RN

## 2023-06-20 ENCOUNTER — Ambulatory Visit: Payer: Medicare Other

## 2023-06-20 VITALS — BP 136/81 | HR 85 | Temp 97.7°F | Resp 18 | Ht 63.0 in | Wt 186.0 lb

## 2023-06-20 DIAGNOSIS — E860 Dehydration: Secondary | ICD-10-CM

## 2023-06-20 MED ORDER — SODIUM CHLORIDE 0.9 % IV BOLUS
1000.0000 mL | Freq: Once | INTRAVENOUS | Status: AC
  Administered 2023-06-20: 1000 mL via INTRAVENOUS
  Filled 2023-06-20: qty 1000

## 2023-06-20 MED ORDER — HEPARIN SOD (PORK) LOCK FLUSH 100 UNIT/ML IV SOLN
500.0000 [IU] | Freq: Once | INTRAVENOUS | Status: AC | PRN
  Administered 2023-06-20: 500 [IU]

## 2023-06-20 MED ORDER — HEPARIN SOD (PORK) LOCK FLUSH 100 UNIT/ML IV SOLN
250.0000 [IU] | Freq: Once | INTRAVENOUS | Status: DC | PRN
  Filled 2023-06-20: qty 5

## 2023-06-20 NOTE — Progress Notes (Signed)
Diagnosis: Dehydration  Provider:  Chilton Greathouse MD  Procedure: IV Infusion  IV Type: Port a Cath, IV Location: R Chest  Normal Saline, Dose: 1000 ml  Infusion Start Time: 0820  Infusion Stop Time: 0927  Post Infusion IV Care: Port a Cath Deaccessed/Flushed and Heparin locked.  Discharge: Condition: Good, Destination: Home . AVS Declined  Performed by:  Wyvonne Lenz, RN

## 2023-06-22 ENCOUNTER — Ambulatory Visit: Payer: Medicare Other

## 2023-06-22 VITALS — BP 146/84 | HR 95 | Temp 97.7°F | Resp 20 | Ht 63.0 in | Wt 188.0 lb

## 2023-06-22 DIAGNOSIS — E860 Dehydration: Secondary | ICD-10-CM | POA: Diagnosis not present

## 2023-06-22 MED ORDER — HEPARIN SOD (PORK) LOCK FLUSH 100 UNIT/ML IV SOLN
250.0000 [IU] | Freq: Once | INTRAVENOUS | Status: AC | PRN
  Administered 2023-06-22: 250 [IU]
  Filled 2023-06-22: qty 5

## 2023-06-22 MED ORDER — SODIUM CHLORIDE 0.9 % IV BOLUS
1000.0000 mL | Freq: Once | INTRAVENOUS | Status: AC
  Administered 2023-06-22: 1000 mL via INTRAVENOUS
  Filled 2023-06-22: qty 1000

## 2023-06-22 NOTE — Progress Notes (Signed)
Diagnosis: Dehydration  Provider:  Chilton Greathouse MD  Procedure: IV Infusion  IV Type: Port a Cath, IV Location: R Chest  Normal Saline, Dose: 1000 ml  Infusion Start Time: 0829  Infusion Stop Time: 0935  Post Infusion IV Care: Port a Cath Deaccessed/Flushed  Discharge: Condition: Good, Destination: Home . AVS Declined  Performed by:  Wyvonne Lenz, RN

## 2023-06-24 ENCOUNTER — Ambulatory Visit (INDEPENDENT_AMBULATORY_CARE_PROVIDER_SITE_OTHER): Payer: Medicare Other

## 2023-06-24 VITALS — BP 125/68 | HR 101 | Temp 97.8°F | Resp 20 | Ht 63.0 in | Wt 186.2 lb

## 2023-06-24 DIAGNOSIS — E860 Dehydration: Secondary | ICD-10-CM | POA: Diagnosis not present

## 2023-06-24 MED ORDER — SODIUM CHLORIDE 0.9 % IV BOLUS
1000.0000 mL | Freq: Once | INTRAVENOUS | Status: AC
  Administered 2023-06-24: 1000 mL via INTRAVENOUS

## 2023-06-24 MED ORDER — HEPARIN SOD (PORK) LOCK FLUSH 100 UNIT/ML IV SOLN
500.0000 [IU] | Freq: Once | INTRAVENOUS | Status: AC | PRN
  Administered 2023-06-24: 500 [IU]
  Filled 2023-06-24: qty 5

## 2023-06-24 NOTE — Progress Notes (Signed)
Diagnosis: Dehydration  Provider:  Chilton Greathouse MD  Procedure: IV Infusion  IV Type: Port a Cath, IV Location: R Chest  Normal Saline, Dose: 1000 ml  Infusion Start Time: 0822  Infusion Stop Time: 0928  Post Infusion IV Care: Patient declined observation and Port a Cath Deaccessed/Flushed and heparin locked  Discharge: Condition: Good, Destination: Home . AVS Declined  Performed by:  Wyvonne Lenz, RN

## 2023-06-27 ENCOUNTER — Ambulatory Visit: Payer: Medicare Other

## 2023-06-27 VITALS — BP 146/84 | HR 106 | Temp 97.7°F | Resp 20 | Ht 63.0 in | Wt 186.6 lb

## 2023-06-27 DIAGNOSIS — E860 Dehydration: Secondary | ICD-10-CM | POA: Diagnosis not present

## 2023-06-27 MED ORDER — SODIUM CHLORIDE 0.9 % IV BOLUS
1000.0000 mL | Freq: Once | INTRAVENOUS | Status: AC
  Administered 2023-06-27: 1000 mL via INTRAVENOUS
  Filled 2023-06-27: qty 1000

## 2023-06-27 MED ORDER — HEPARIN SOD (PORK) LOCK FLUSH 100 UNIT/ML IV SOLN
500.0000 [IU] | Freq: Once | INTRAVENOUS | Status: AC | PRN
  Administered 2023-06-27: 500 [IU]
  Filled 2023-06-27: qty 5

## 2023-06-27 NOTE — Progress Notes (Signed)
Diagnosis: Dehydration  Provider:  Chilton Greathouse MD  Procedure: IV Infusion  IV Type: Port a Cath, IV Location: R Chest  Normal Saline, Dose: 1000 ml  Infusion Start Time: 0817  Infusion Stop Time: 0921  Post Infusion IV Care: Port a Cath Deaccessed/Flushed and heparin locked  Discharge: Condition: Good, Destination: Home . AVS Declined  Performed by:  Wyvonne Lenz, RN

## 2023-06-29 ENCOUNTER — Ambulatory Visit: Payer: Medicare Other

## 2023-06-29 VITALS — BP 158/94 | HR 91 | Temp 98.0°F | Resp 20 | Ht 63.0 in | Wt 187.2 lb

## 2023-06-29 DIAGNOSIS — E860 Dehydration: Secondary | ICD-10-CM | POA: Diagnosis not present

## 2023-06-29 MED ORDER — SODIUM CHLORIDE 0.9 % IV BOLUS
1000.0000 mL | Freq: Once | INTRAVENOUS | Status: AC
  Administered 2023-06-29: 1000 mL via INTRAVENOUS
  Filled 2023-06-29: qty 1000

## 2023-06-29 MED ORDER — HEPARIN SOD (PORK) LOCK FLUSH 100 UNIT/ML IV SOLN
500.0000 [IU] | Freq: Once | INTRAVENOUS | Status: AC | PRN
  Administered 2023-06-29: 500 [IU]

## 2023-06-29 NOTE — Progress Notes (Signed)
Diagnosis: Dehydration  Provider:  Chilton Greathouse MD  Procedure: IV Infusion  IV Type: Port a Cath, IV Location: R Chest  Normal Saline, Dose: 1000 ml  Infusion Start Time: 0825  Infusion Stop Time: 0935  Post Infusion IV Care: Port a Cath Deaccessed/Flushed  Discharge: Condition: Good, Destination: Home . AVS Declined  Performed by:  Nat Math, RN

## 2023-07-01 ENCOUNTER — Ambulatory Visit (INDEPENDENT_AMBULATORY_CARE_PROVIDER_SITE_OTHER): Payer: Medicare Other

## 2023-07-01 VITALS — BP 126/81 | HR 100 | Temp 97.9°F | Resp 20 | Ht 63.0 in | Wt 188.0 lb

## 2023-07-01 DIAGNOSIS — E860 Dehydration: Secondary | ICD-10-CM

## 2023-07-01 MED ORDER — HEPARIN SOD (PORK) LOCK FLUSH 100 UNIT/ML IV SOLN
500.0000 [IU] | Freq: Once | INTRAVENOUS | Status: AC | PRN
  Administered 2023-07-01: 500 [IU]
  Filled 2023-07-01: qty 5

## 2023-07-01 MED ORDER — SODIUM CHLORIDE 0.9 % IV BOLUS
1000.0000 mL | Freq: Once | INTRAVENOUS | Status: AC
Start: 2023-07-01 — End: 2023-07-01
  Administered 2023-07-01: 1000 mL via INTRAVENOUS

## 2023-07-01 NOTE — Progress Notes (Signed)
Diagnosis: Dehydration  Provider:  Chilton Greathouse MD  Procedure: IV Infusion  IV Type: Port a Cath, IV Location: R Chest  Normal Saline, Dose: 1000 ml  Infusion Start Time: 0816  Infusion Stop Time: 0928  Post Infusion IV Care: Port a Cath Deaccessed/Flushed  Discharge: Condition: Good, Destination: Home . AVS Declined  Performed by:  Nat Math, RN

## 2023-07-04 ENCOUNTER — Ambulatory Visit (INDEPENDENT_AMBULATORY_CARE_PROVIDER_SITE_OTHER): Payer: Medicare Other

## 2023-07-04 VITALS — BP 149/84 | HR 106 | Temp 97.7°F | Resp 18 | Ht 63.0 in | Wt 187.8 lb

## 2023-07-04 DIAGNOSIS — E860 Dehydration: Secondary | ICD-10-CM

## 2023-07-04 MED ORDER — HEPARIN SOD (PORK) LOCK FLUSH 100 UNIT/ML IV SOLN
500.0000 [IU] | Freq: Once | INTRAVENOUS | Status: AC | PRN
  Administered 2023-07-04: 500 [IU]
  Filled 2023-07-04: qty 5

## 2023-07-04 MED ORDER — SODIUM CHLORIDE 0.9 % IV BOLUS
1000.0000 mL | Freq: Once | INTRAVENOUS | Status: AC
  Administered 2023-07-04: 1000 mL via INTRAVENOUS
  Filled 2023-07-04: qty 1000

## 2023-07-04 NOTE — Progress Notes (Signed)
Diagnosis: Dehydration  Provider:  Chilton Greathouse MD  Procedure: IV Infusion  IV Type: Port a Cath, IV Location: R Chest  Normal Saline, Dose: 1000 ml  Infusion Start Time: 0830  Infusion Stop Time: 0934  Post Infusion IV Care: Port a Cath Deaccessed/Flushed  Discharge: Condition: Good, Destination: Home . AVS Declined  Performed by:  Nat Math, RN

## 2023-07-06 ENCOUNTER — Ambulatory Visit (INDEPENDENT_AMBULATORY_CARE_PROVIDER_SITE_OTHER): Payer: Medicare Other

## 2023-07-06 VITALS — BP 169/100 | HR 90 | Temp 97.7°F | Resp 22 | Ht 63.0 in | Wt 189.0 lb

## 2023-07-06 DIAGNOSIS — E860 Dehydration: Secondary | ICD-10-CM

## 2023-07-06 MED ORDER — HEPARIN SOD (PORK) LOCK FLUSH 100 UNIT/ML IV SOLN
500.0000 [IU] | Freq: Once | INTRAVENOUS | Status: AC | PRN
  Administered 2023-07-06: 250 [IU]
  Filled 2023-07-06: qty 5

## 2023-07-06 MED ORDER — SODIUM CHLORIDE 0.9 % IV BOLUS
1000.0000 mL | Freq: Once | INTRAVENOUS | Status: AC
Start: 2023-07-06 — End: 2023-07-06
  Administered 2023-07-06: 1000 mL via INTRAVENOUS
  Filled 2023-07-06: qty 1000

## 2023-07-06 NOTE — Progress Notes (Signed)
Diagnosis: Dehydration  Provider:  Chilton Greathouse MD  Procedure: IV Infusion  IV Type: Port a Cath, IV Location: R Chest  Normal Saline, Dose: 1000 ml  Infusion Start Time: 0848  Infusion Stop Time: 0955  Post Infusion IV Care: PICC Line Flushed/Capped and Heparin locked.  Discharge: Condition: Good, Destination: Home . AVS Declined  Performed by:  Wyvonne Lenz, RN

## 2023-07-08 ENCOUNTER — Ambulatory Visit (INDEPENDENT_AMBULATORY_CARE_PROVIDER_SITE_OTHER): Payer: Medicare Other

## 2023-07-08 VITALS — BP 150/98 | HR 83 | Temp 98.2°F | Resp 20 | Ht 63.0 in | Wt 188.4 lb

## 2023-07-08 DIAGNOSIS — R198 Other specified symptoms and signs involving the digestive system and abdomen: Secondary | ICD-10-CM | POA: Diagnosis not present

## 2023-07-08 DIAGNOSIS — E860 Dehydration: Secondary | ICD-10-CM | POA: Diagnosis not present

## 2023-07-08 DIAGNOSIS — Z932 Ileostomy status: Secondary | ICD-10-CM

## 2023-07-08 DIAGNOSIS — K50819 Crohn's disease of both small and large intestine with unspecified complications: Secondary | ICD-10-CM | POA: Diagnosis not present

## 2023-07-08 MED ORDER — OCTREOTIDE ACETATE 20 MG IM KIT
20.0000 mg | PACK | Freq: Once | INTRAMUSCULAR | Status: AC
  Administered 2023-07-08: 20 mg via INTRAMUSCULAR
  Filled 2023-07-08: qty 1

## 2023-07-08 MED ORDER — HEPARIN SOD (PORK) LOCK FLUSH 100 UNIT/ML IV SOLN
500.0000 [IU] | Freq: Once | INTRAVENOUS | Status: AC | PRN
Start: 2023-07-08 — End: 2023-07-08
  Administered 2023-07-08: 500 [IU]
  Filled 2023-07-08: qty 5

## 2023-07-08 MED ORDER — SODIUM CHLORIDE 0.9 % IV BOLUS
1000.0000 mL | Freq: Once | INTRAVENOUS | Status: AC
  Administered 2023-07-08: 1000 mL via INTRAVENOUS

## 2023-07-08 NOTE — Progress Notes (Signed)
Diagnosis: Dehydration  Provider:  Chilton Greathouse MD  Procedure: IV Infusion  IV Type: Port a Cath, IV Location: R Chest  Normal Saline, Dose: 1000 ml  Infusion Start Time: 0814  Infusion Stop Time: 0921   Procedure: Injection  Octreotide, Dose: 20 mg, Site: intramuscular, Number of injections: 1   Post Infusion IV Care: Port a Cath Deaccessed/Flushed  Discharge: Condition: Good, Destination: Home . AVS Declined  Performed by:  Adriana Mccallum, RN

## 2023-07-11 ENCOUNTER — Ambulatory Visit: Payer: Medicare Other

## 2023-07-11 VITALS — BP 151/86 | HR 94 | Temp 98.1°F | Resp 16 | Ht 63.0 in | Wt 188.4 lb

## 2023-07-11 DIAGNOSIS — E860 Dehydration: Secondary | ICD-10-CM | POA: Diagnosis not present

## 2023-07-11 MED ORDER — SODIUM CHLORIDE 0.9 % IV BOLUS
1000.0000 mL | Freq: Once | INTRAVENOUS | Status: AC
  Administered 2023-07-11: 1000 mL via INTRAVENOUS
  Filled 2023-07-11: qty 1000

## 2023-07-11 NOTE — Progress Notes (Signed)
Diagnosis: Dehydration  Provider:  Chilton Greathouse MD  Procedure: IV Infusion  IV Type: Peripheral, IV Location: L Upper Arm  Patient stated port had flipped over and upon palpation only flat surface palpated. PIV placed.  Normal Saline, Dose: 1000 ml  Infusion Start Time: 0819  Infusion Stop Time: 0925  Post Infusion IV Care: Patient declined observation and Peripheral IV Discontinued  Discharge: Condition: Good, Destination: Home . AVS Declined  Performed by:  Wyvonne Lenz, RN

## 2023-07-13 ENCOUNTER — Ambulatory Visit: Payer: Medicare Other

## 2023-07-13 VITALS — BP 181/105 | HR 72 | Temp 97.7°F | Resp 16 | Wt 186.4 lb

## 2023-07-13 DIAGNOSIS — E860 Dehydration: Secondary | ICD-10-CM | POA: Diagnosis not present

## 2023-07-13 MED ORDER — SODIUM CHLORIDE 0.9 % IV BOLUS
1000.0000 mL | Freq: Once | INTRAVENOUS | Status: AC
  Administered 2023-07-13: 1000 mL via INTRAVENOUS
  Filled 2023-07-13: qty 1000

## 2023-07-13 NOTE — Progress Notes (Deleted)
Diagnosis: {Diagnosis:25401}  Provider:  {CHINFMD:27988::"Praveen Mannam MD"}  Procedure: {Injection/Infusion:25339}  {IV Type:25393::"Peripheral"}, {IV Location:25394}  {Medications:25395}, {Dose:25397}  {Infusion Start Time:25399}  {Infusion Stop Time:25400}  Post Infusion IV Care: {CHINF Post Infusion:25398}  Discharge: {Condition:19696:::1}, {Destination:18313::"Home":1} . {CHINFAVS:28985}  Performed by:  Adriana Mccallum, RN

## 2023-07-13 NOTE — Progress Notes (Signed)
Diagnosis: Dehydration  Provider:  Chilton Greathouse MD  Procedure: IV Infusion  IV Type: Peripheral, IV Location: L Antecubital  Normal Saline, Dose: 1000 ml  Infusion Start Time: 0826  Infusion Stop Time: 0934  Post Infusion IV Care: Peripheral IV Discontinued  Discharge: Condition: Good, Destination: Home . AVS Declined  Performed by:  Adriana Mccallum, RN

## 2023-07-15 ENCOUNTER — Ambulatory Visit (INDEPENDENT_AMBULATORY_CARE_PROVIDER_SITE_OTHER): Payer: Medicare Other

## 2023-07-15 VITALS — BP 153/103 | HR 90 | Temp 97.6°F | Resp 16 | Ht 63.0 in | Wt 188.8 lb

## 2023-07-15 DIAGNOSIS — E860 Dehydration: Secondary | ICD-10-CM

## 2023-07-15 MED ORDER — SODIUM CHLORIDE 0.9 % IV BOLUS
1000.0000 mL | Freq: Once | INTRAVENOUS | Status: AC
  Administered 2023-07-15: 1000 mL via INTRAVENOUS

## 2023-07-15 NOTE — Progress Notes (Signed)
Diagnosis: Dehydration  Provider:  Chilton Greathouse MD  Procedure: IV Infusion  IV Type: Peripheral, IV Location: L Antecubital  Normal Saline, Dose: 1000 ml  Infusion Start Time: 0818  Infusion Stop Time: 0926  Post Infusion IV Care: Peripheral IV Discontinued  Discharge: Condition: Good, Destination: Home . AVS Declined  Performed by:  Wyvonne Lenz, RN

## 2023-07-18 ENCOUNTER — Ambulatory Visit: Payer: Medicare Other

## 2023-07-18 VITALS — BP 147/85 | HR 83 | Temp 97.9°F | Resp 18 | Ht 63.0 in | Wt 188.0 lb

## 2023-07-18 DIAGNOSIS — E860 Dehydration: Secondary | ICD-10-CM

## 2023-07-18 MED ORDER — SODIUM CHLORIDE 0.9 % IV BOLUS
1000.0000 mL | Freq: Once | INTRAVENOUS | Status: AC
  Administered 2023-07-18: 1000 mL via INTRAVENOUS

## 2023-07-18 NOTE — Progress Notes (Signed)
Diagnosis: Dehydration  Provider:  Chilton Greathouse MD  Procedure: IV Infusion  IV Type: Peripheral, IV Location: L Forearm  Normal Saline, Dose: 1000 ml  Infusion Start Time: 0935  Infusion Stop Time: 1041  Post Infusion IV Care: Patient declined observation  Discharge: Condition: Good, Destination: Home . AVS Declined  Performed by:  Marlow Baars [place], RN

## 2023-07-20 ENCOUNTER — Ambulatory Visit: Payer: Medicare Other

## 2023-07-20 VITALS — BP 159/92 | HR 87 | Temp 97.9°F | Resp 20 | Ht 63.0 in | Wt 189.8 lb

## 2023-07-20 DIAGNOSIS — E860 Dehydration: Secondary | ICD-10-CM

## 2023-07-20 MED ORDER — SODIUM CHLORIDE 0.9 % IV BOLUS
1000.0000 mL | Freq: Once | INTRAVENOUS | Status: AC
  Administered 2023-07-20: 1000 mL via INTRAVENOUS
  Filled 2023-07-20: qty 1000

## 2023-07-20 NOTE — Progress Notes (Signed)
Diagnosis: Dehydration  Provider:  Chilton Greathouse MD  Procedure: IV Infusion  IV Type: Peripheral, IV Location: L Antecubital  Normal Saline, Dose: 1000 ml  Infusion Start Time: 0822  Infusion Stop Time: 0933  Post Infusion IV Care: Peripheral IV Discontinued  Discharge: Condition: Good, Destination: Home . AVS Declined  Performed by:  Nat Math, RN

## 2023-07-22 ENCOUNTER — Ambulatory Visit (INDEPENDENT_AMBULATORY_CARE_PROVIDER_SITE_OTHER): Payer: Medicare Other

## 2023-07-22 ENCOUNTER — Telehealth (HOSPITAL_COMMUNITY): Payer: Self-pay | Admitting: Radiology

## 2023-07-22 VITALS — BP 142/86 | HR 92 | Temp 97.5°F | Resp 20 | Ht 63.0 in | Wt 188.2 lb

## 2023-07-22 DIAGNOSIS — E860 Dehydration: Secondary | ICD-10-CM | POA: Diagnosis not present

## 2023-07-22 MED ORDER — SODIUM CHLORIDE 0.9 % IV BOLUS
1000.0000 mL | Freq: Once | INTRAVENOUS | Status: AC
  Administered 2023-07-22: 1000 mL via INTRAVENOUS
  Filled 2023-07-22: qty 1000

## 2023-07-22 MED ORDER — HEPARIN SOD (PORK) LOCK FLUSH 100 UNIT/ML IV SOLN: 500.0000 [IU] | Freq: Once | INTRAVENOUS | Status: DC | PRN

## 2023-07-22 NOTE — Telephone Encounter (Signed)
Called pt to schedule port check/fibrin stripping. Left VM for her to call back. JM

## 2023-07-22 NOTE — Progress Notes (Signed)
Diagnosis: Dehydration  Provider:  Chilton Greathouse MD  Procedure: IV Infusion  IV Type: Peripheral, IV Location: L Antecubital  Normal Saline, Dose: 1000 ml  Infusion Start Time: 0811  Infusion Stop Time: 0919  Post Infusion IV Care: Patient declined observation and Peripheral IV Discontinued  Discharge: Condition: Good, Destination: Home . AVS Declined  Performed by:  Loney Hering, LPN

## 2023-07-25 ENCOUNTER — Ambulatory Visit: Payer: Medicare Other

## 2023-07-25 VITALS — BP 163/78 | HR 93 | Temp 97.9°F | Resp 18 | Ht 63.0 in | Wt 190.2 lb

## 2023-07-25 DIAGNOSIS — E860 Dehydration: Secondary | ICD-10-CM | POA: Diagnosis not present

## 2023-07-25 MED ORDER — SODIUM CHLORIDE 0.9 % IV BOLUS
1000.0000 mL | Freq: Once | INTRAVENOUS | Status: AC
  Administered 2023-07-25: 1000 mL via INTRAVENOUS
  Filled 2023-07-25: qty 1000

## 2023-07-25 NOTE — Progress Notes (Signed)
Diagnosis: Dehydration  Provider:  Chilton Greathouse MD  Procedure: IV Infusion  IV Type: Peripheral, IV Location: L Antecubital  Normal Saline, Dose: 1000 ml  Infusion Start Time: 0831  Infusion Stop Time: 0939  Post Infusion IV Care: Peripheral IV Discontinued  Discharge: Condition: Good, Destination: Home . AVS Declined  Performed by:  Garnette Czech, RN

## 2023-07-26 ENCOUNTER — Telehealth (HOSPITAL_COMMUNITY): Payer: Self-pay

## 2023-07-26 ENCOUNTER — Other Ambulatory Visit (HOSPITAL_COMMUNITY): Payer: Self-pay | Admitting: Nephrology

## 2023-07-26 DIAGNOSIS — E871 Hypo-osmolality and hyponatremia: Secondary | ICD-10-CM | POA: Diagnosis not present

## 2023-07-26 DIAGNOSIS — E876 Hypokalemia: Secondary | ICD-10-CM | POA: Diagnosis not present

## 2023-07-26 DIAGNOSIS — D631 Anemia in chronic kidney disease: Secondary | ICD-10-CM | POA: Diagnosis not present

## 2023-07-26 DIAGNOSIS — N179 Acute kidney failure, unspecified: Secondary | ICD-10-CM

## 2023-07-26 DIAGNOSIS — N184 Chronic kidney disease, stage 4 (severe): Secondary | ICD-10-CM | POA: Diagnosis not present

## 2023-07-26 DIAGNOSIS — I129 Hypertensive chronic kidney disease with stage 1 through stage 4 chronic kidney disease, or unspecified chronic kidney disease: Secondary | ICD-10-CM | POA: Diagnosis not present

## 2023-07-26 DIAGNOSIS — K5090 Crohn's disease, unspecified, without complications: Secondary | ICD-10-CM | POA: Diagnosis not present

## 2023-07-26 DIAGNOSIS — N2581 Secondary hyperparathyroidism of renal origin: Secondary | ICD-10-CM | POA: Diagnosis not present

## 2023-07-26 DIAGNOSIS — D509 Iron deficiency anemia, unspecified: Secondary | ICD-10-CM | POA: Diagnosis not present

## 2023-07-26 DIAGNOSIS — E88819 Insulin resistance, unspecified: Secondary | ICD-10-CM | POA: Diagnosis not present

## 2023-07-26 DIAGNOSIS — R768 Other specified abnormal immunological findings in serum: Secondary | ICD-10-CM | POA: Diagnosis not present

## 2023-07-26 DIAGNOSIS — M109 Gout, unspecified: Secondary | ICD-10-CM | POA: Diagnosis not present

## 2023-07-26 DIAGNOSIS — N189 Chronic kidney disease, unspecified: Secondary | ICD-10-CM | POA: Diagnosis not present

## 2023-07-26 NOTE — Telephone Encounter (Signed)
Called to schedule port eval, no answer, left vm. AB

## 2023-07-27 ENCOUNTER — Ambulatory Visit: Payer: Medicare Other

## 2023-07-27 VITALS — BP 137/79 | HR 94 | Temp 98.1°F | Resp 18

## 2023-07-27 DIAGNOSIS — E860 Dehydration: Secondary | ICD-10-CM | POA: Diagnosis not present

## 2023-07-27 LAB — LAB REPORT - SCANNED
Creatinine, POC: 99 mg/dL
EGFR: 15

## 2023-07-27 MED ORDER — SODIUM CHLORIDE 0.9 % IV BOLUS
1000.0000 mL | Freq: Once | INTRAVENOUS | Status: AC
  Administered 2023-07-27: 1000 mL via INTRAVENOUS

## 2023-07-27 NOTE — Progress Notes (Signed)
Diagnosis: Dehydration  Provider:  Chilton Greathouse MD  Procedure: IV Infusion  IV Type: Peripheral, IV Location: L Antecubital  Normal Saline, Dose: 1000 ml  Infusion Start Time: 0815  Infusion Stop Time: 0921  Post Infusion IV Care: Peripheral IV Discontinued  Discharge: Condition: Good, Destination: Home . AVS Declined  Performed by:  Rico Ala, LPN

## 2023-07-29 ENCOUNTER — Ambulatory Visit: Payer: Medicare Other

## 2023-07-29 VITALS — BP 152/87 | HR 89 | Temp 97.6°F | Resp 20 | Ht 63.0 in | Wt 187.8 lb

## 2023-07-29 DIAGNOSIS — E860 Dehydration: Secondary | ICD-10-CM | POA: Diagnosis not present

## 2023-07-29 MED ORDER — SODIUM CHLORIDE 0.9 % IV BOLUS
1000.0000 mL | Freq: Once | INTRAVENOUS | Status: AC
  Administered 2023-07-29: 1000 mL via INTRAVENOUS
  Filled 2023-07-29: qty 1000

## 2023-07-29 NOTE — Progress Notes (Signed)
Diagnosis: Dehydration  Provider:  Chilton Greathouse MD  Procedure: IV Infusion  IV Type: Peripheral, IV Location: L Upper Arm  Normal Saline, Dose: 1000 ml  Infusion Start Time: 0846  Infusion Stop Time: 0951  Post Infusion IV Care: Peripheral IV Discontinued  Discharge: Condition: Good, Destination: Home . AVS Declined  Performed by:  Rico Ala, LPN

## 2023-08-01 ENCOUNTER — Other Ambulatory Visit: Payer: Self-pay | Admitting: Student

## 2023-08-01 ENCOUNTER — Other Ambulatory Visit: Payer: Self-pay | Admitting: Radiology

## 2023-08-01 ENCOUNTER — Ambulatory Visit: Payer: Medicare Other

## 2023-08-01 VITALS — BP 151/100 | HR 85 | Temp 97.9°F | Resp 20 | Ht 63.0 in | Wt 190.0 lb

## 2023-08-01 DIAGNOSIS — K50819 Crohn's disease of both small and large intestine with unspecified complications: Secondary | ICD-10-CM

## 2023-08-01 DIAGNOSIS — E860 Dehydration: Secondary | ICD-10-CM | POA: Diagnosis not present

## 2023-08-01 MED ORDER — SODIUM CHLORIDE 0.9 % IV BOLUS
1000.0000 mL | Freq: Once | INTRAVENOUS | Status: AC
  Administered 2023-08-01: 1000 mL via INTRAVENOUS
  Filled 2023-08-01: qty 1000

## 2023-08-01 NOTE — Progress Notes (Signed)
Diagnosis: Dehydration  Provider:  Chilton Greathouse MD  Procedure: IV Infusion  IV Type: Peripheral, IV Location: L Hand  Normal Saline, Dose: 1000 mg  Infusion Start Time: 0821  Infusion Stop Time: 0926  Post Infusion IV Care: Peripheral IV Discontinued  Discharge: Condition: Good, Destination: Home . AVS Declined  Performed by:  Rico Ala, LPN

## 2023-08-02 ENCOUNTER — Other Ambulatory Visit (HOSPITAL_COMMUNITY): Payer: Self-pay | Admitting: Radiology

## 2023-08-02 ENCOUNTER — Other Ambulatory Visit: Payer: Self-pay

## 2023-08-02 ENCOUNTER — Encounter (HOSPITAL_COMMUNITY): Payer: Self-pay

## 2023-08-02 ENCOUNTER — Ambulatory Visit (HOSPITAL_COMMUNITY)
Admission: RE | Admit: 2023-08-02 | Discharge: 2023-08-02 | Disposition: A | Discharge status: Home / Self Care | Payer: Medicare Other | Source: Ambulatory Visit | Attending: Radiology | Admitting: Radiology

## 2023-08-02 DIAGNOSIS — Z95828 Presence of other vascular implants and grafts: Secondary | ICD-10-CM

## 2023-08-02 DIAGNOSIS — N183 Chronic kidney disease, stage 3 unspecified: Secondary | ICD-10-CM | POA: Diagnosis not present

## 2023-08-02 DIAGNOSIS — I129 Hypertensive chronic kidney disease with stage 1 through stage 4 chronic kidney disease, or unspecified chronic kidney disease: Secondary | ICD-10-CM | POA: Insufficient documentation

## 2023-08-02 DIAGNOSIS — Z452 Encounter for adjustment and management of vascular access device: Secondary | ICD-10-CM | POA: Insufficient documentation

## 2023-08-02 DIAGNOSIS — Z87891 Personal history of nicotine dependence: Secondary | ICD-10-CM | POA: Diagnosis not present

## 2023-08-02 DIAGNOSIS — K50819 Crohn's disease of both small and large intestine with unspecified complications: Secondary | ICD-10-CM

## 2023-08-02 DIAGNOSIS — N390 Urinary tract infection, site not specified: Secondary | ICD-10-CM

## 2023-08-02 HISTORY — PX: IR CV LINE INJECTION: IMG2294

## 2023-08-02 MED ORDER — LIDOCAINE-EPINEPHRINE 1 %-1:100000 IJ SOLN
INTRAMUSCULAR | Status: AC
  Filled 2023-08-02: qty 1

## 2023-08-02 MED ORDER — MIDAZOLAM HCL 2 MG/2ML IJ SOLN
INTRAMUSCULAR | Status: AC
Start: 2023-08-02 — End: ?
  Filled 2023-08-02: qty 2

## 2023-08-02 MED ORDER — FENTANYL CITRATE (PF) 100 MCG/2ML IJ SOLN
INTRAMUSCULAR | Status: AC | PRN
  Administered 2023-08-02: 50 ug via INTRAVENOUS

## 2023-08-02 MED ORDER — HEPARIN SODIUM (PORCINE) 1000 UNIT/ML IJ SOLN
INTRAMUSCULAR | Status: AC
  Filled 2023-08-02: qty 10

## 2023-08-02 MED ORDER — HEPARIN SOD (PORK) LOCK FLUSH 100 UNIT/ML IV SOLN
INTRAVENOUS | Status: AC
  Filled 2023-08-02: qty 5

## 2023-08-02 MED ORDER — FENTANYL CITRATE (PF) 100 MCG/2ML IJ SOLN
INTRAMUSCULAR | Status: AC
  Filled 2023-08-02: qty 2

## 2023-08-02 MED ORDER — MIDAZOLAM HCL 2 MG/2ML IJ SOLN
INTRAMUSCULAR | Status: AC | PRN
  Administered 2023-08-02 (×2): .5 mg via INTRAVENOUS

## 2023-08-02 MED ORDER — IOHEXOL 300 MG/ML  SOLN: 100.0000 mL | Freq: Once | INTRAMUSCULAR | Status: DC | PRN

## 2023-08-02 NOTE — H&P (Signed)
Chief Complaint: Patient was seen in consultation today for [facility] a cath evaluation- possible replacement; and possible fibrin sheath removal   Supervising Physician: Marliss Coots  Patient Status: [facility] - Out-pt  History of Present Illness: [name] is a 67 y.o. female   CKD Follows with Dr Arrie Aran Pt has PAC placed in IR 04/2023 Using for IV access and fluid administration. Has fluid admin 3x/week usually. Last use 3 weeks ago Had fibrin sheath removal and revision per pt--- I dont see documented in chart  Scheduled here today for [facility] evaluation and possible fibrin sheath removal   Past Medical History:  Diagnosis Date   Abdominal pain    Hx   Acute pyelonephritis 12/07/2020   Allergy    Anal stenosis    Anemia    Anxiety    Arthritis    Asthma    patient does not have inhaler   Blood in stool    Hx   Blood in urine    Hx   Blood transfusion without reported diagnosis    Cataract    CKD (chronic kidney disease) stage 3, GFR 30-59 ml/min (HCC)    Crohn's colitis, other complication (HCC)    De Quervain's tenosynovitis    Depression    Difficulty urinating    Hx   Easy bruising    Esophagitis    Fainting    History - resolved - due to dehydration   Fatigue    Hx   Fibroid    Hx   Gastric polyp    GERD (gastroesophageal reflux disease)    Hearing loss    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   Hemorrhoids, external    Hemorrhoids, internal    Herpes, genital    vaginal treated 07/05/12 and pt states is resolved   History of cervical dysplasia    History of small bowel obstruction    Hyperlipidemia    currently no meds   Hyperparathyroidism    Hypertension    Hypokalemia    Hx   Hypotension 06/29/2022   IBD (inflammatory bowel disease)    initially colectomy for suspected UC, 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   Obesity    Ovarian cyst    Pain in joint, pelvic region and thigh  12/16/2008   Centricity Description: HIP PAIN, LEFT  Qualifier: Diagnosis of   By: Janit Bern      Centricity Description: HIP PAIN  Qualifier: Diagnosis of   By: Janit Bern       Pain of right thumb 04/12/2018   Poor dental hygiene    Pulmonary nodule, right    right upper lobe   Rectal bleeding    Hx   Rectal pain    Hx   Renal insufficiency    CKD - stage 3   RLS (restless legs syndrome)    no meds   Sepsis secondary to UTI (HCC) 06/29/2022   Severe sepsis (HCC) 12/07/2020   Tooth infection 11/2016   right low   Ulcerative colitis    Visual disturbance    wears glasses   Weakness generalized    Hx - patient denies generalized weakness   Wears dentures    upper only    Past Surgical History:  Procedure Laterality Date   ANAL DILATION     BIOPSY  07/20/2022   Procedure: BIOPSY;  Surgeon: Meryl Dare, MD;  Location: [facility];  Service: Gastroenterology;;   CERVICAL BIOPSY  W/ LOOP ELECTRODE EXCISION     CHOLECYSTECTOMY     COLONOSCOPY     [name]   fatty tumor removed from back     X 2   HEMORRHOID SURGERY     ILEOSCOPY N/A 07/20/2022   Procedure: ILEOSCOPY THROUGH STOMA;  Surgeon: Meryl Dare, MD;  Location: [facility];  Service: Gastroenterology;  Laterality: N/A;   ILEOSTOMY CLOSURE     IR CV LINE INJECTION  05/30/2023   IR IMAGING GUIDED PORT INSERTION  04/12/2023   RESTORATIVE PROCTOCOLECTOMY     with insertion of ileoanal J Pouch with loop ileostomy   SHOULDER ARTHROSCOPY WITH CAPSULORRHAPHY Left 06/14/2019   Procedure: LEFT SHOULDER ARTHRSCOPIC REPAIR OF BONY BANKART FRACTURE;  Surgeon: Jones Broom, MD;  Location: [facility]S;  Service: Orthopedics;  Laterality: Left;  REQUEST 90 MINUTE   SIGMOIDOSCOPY     TOTAL ABDOMINAL HYSTERECTOMY  1998   TAH/LSO   TUBAL LIGATION     UPPER GASTROINTESTINAL ENDOSCOPY     [name]    Allergies: Sulfa antibiotics, Morphine, and Sulfonamide derivatives  Medications: Prior to Admission medications    Medication Sig Start Date End Date Taking? Authorizing Provider  acetaminophen (TYLENOL) 500 MG tablet Take 1,000 mg by mouth every 6 (six) hours as needed (for pain/sleep).   Yes [provider]  allopurinol (ZYLOPRIM) 300 MG tablet Take 0.5 tablets (150 mg total) by mouth at bedtime. Patient taking differently: Take 300 mg by mouth at bedtime. 12/10/22  Yes [name], DO  Calcium Carbonate Antacid (TUMS EXTRA STRENGTH 750 PO) Take 1-2 tablets by mouth every 6 (six) hours as needed (CHEW for heartburn).   Yes [provider]  DULoxetine (CYMBALTA) 60 MG capsule Take 2 capsules (120 mg total) by mouth daily. 02/16/23  Yes [name], DO  folic acid (FOLVITE) 1 MG tablet Take 2 tablets (2 mg total) by mouth daily. Patient taking differently: Take 1 mg by mouth daily. 05/26/17  Yes Ennever, Rose Phi, MD  GAS-X EXTRA STRENGTH 125 MG CAPS Take 125 mg by mouth at bedtime.   Yes [provider]  loperamide (IMODIUM A-D) 2 MG tablet Take 1 tablet (2 mg total) by mouth 3 (three) times daily. Titrate as needed 06/03/23  Yes Armbruster, Willaim Rayas, MD  metoprolol succinate (TOPROL-XL) 100 MG 24 hr tablet Take 100 mg by mouth at bedtime.   Yes [provider]  OVER THE COUNTER MEDICATION Take 1 tablet by mouth See admin instructions. Nervive Nerve Health tablet- Take 1 tablet by mouth at bedtime   Yes [provider]  Vitamin D, Ergocalciferol, (DRISDOL) 1.25 MG (50000 UNIT) CAPS capsule Take 50,000 Units by mouth every 30 (thirty) days.   Yes [provider]  apixaban (ELIQUIS) 5 MG TABS tablet Take 1 tablet (5 mg total) by mouth 2 (two) times daily. 02/03/23 06/03/23  Lorin Glass, MD  Psyllium (METAMUCIL PREMIUM BLEND) 52.63 % POWD Take Daily 06/03/23   Armbruster, Willaim Rayas, MD     Family History  Problem Relation Age of Onset   Hypertension Mother    Heart disease Mother        s/p pci   Ulcerative colitis Father    Hypertension  Father    Heart attack Father    Diabetes Sister    Cancer Sister        uterine   Pulmonary fibrosis Sister    Cancer  Maternal Uncle        LUNG   Ulcerative colitis Daughter    Liver disease Daughter    Irritable bowel syndrome Other        grandchildren   Colon cancer Neg Hx    Esophageal cancer Neg Hx    Stomach cancer Neg Hx    Rectal cancer Neg Hx     Social History   Socioeconomic History   Marital status: Divorced    Spouse name: Not on file   Number of children: Not on file   Years of education: Not on file   Highest education level: Not on file  Occupational History   Not on file  Tobacco Use   Smoking status: Former    Current packs/day: 0.00    Average packs/day: 1 pack/day for 4.0 years (4.0 ttl pk-yrs)    Types: Cigarettes    Start date: 05/21/1975    Quit date: 05/21/1979    Years since quitting: 44.2   Smokeless tobacco: Never  Vaping Use   Vaping status: Never Used  Substance and Sexual Activity   Alcohol use: No    Alcohol/week: 0.0 standard drinks of alcohol   Drug use: No   Sexual activity: Yes    Birth control/protection: Post-menopausal    Comment: 1st intercourse 67 yo-Fewer than 5 partners  Other Topics Concern   Not on file  Social History Narrative   Not on file   Social Determinants of Health   Financial Resource Strain: Low Risk  (05/26/2022)   Overall Financial Resource Strain (CARDIA)    Difficulty of Paying Living Expenses: Not hard at all  Food Insecurity: No Food Insecurity (05/28/2023)   Hunger Vital Sign    Worried About Running Out of Food in the Last Year: Never true    Ran Out of Food in the Last Year: Never true  Transportation Needs: No Transportation Needs (05/28/2023)   PRAPARE - Administrator, Civil Service (Medical): No    Lack of Transportation (Non-Medical): No  Physical Activity: Inactive (05/26/2022)   Exercise Vital Sign    Days of Exercise per Week: 0 days    Minutes of Exercise per Session: 0  min  Stress: No Stress Concern Present (05/26/2022)   [employer] of Occupational Health - Occupational Stress Questionnaire    Feeling of Stress : Not at all  Social Connections: Moderately Isolated (05/26/2022)   Social Connection and Isolation Panel [NHANES]    Frequency of Communication with Friends and Family: More than three times a week    Frequency of Social Gatherings with Friends and Family: More than three times a week    Attends Religious Services: Never    Database administrator or Organizations: No    Attends Banker Meetings: Never    Marital Status: Married    Review of Systems: A 12 point ROS discussed and pertinent positives are indicated in the HPI above.  All other systems are negative.  Review of Systems  Constitutional:  Positive for fatigue. Negative for activity change and fever.  Respiratory:  Negative for cough and shortness of breath.   Cardiovascular:  Negative for chest pain.  Gastrointestinal:  Negative for abdominal pain.  Neurological:  Negative for weakness.  Psychiatric/Behavioral:  Negative for behavioral problems and confusion.     Vital Signs: BP (!) 140/90   Pulse (!) 109   Temp (!) 97.5 F (36.4 C) (Temporal)   Resp 18   Ht 5'  3" (1.6 m)   Wt 190 lb (86.2 kg)   SpO2 97%   BMI 33.66 kg/m   Advance Care Plan: The advanced care plan/surrogate decision maker was discussed at the time of visit and documented in the medical record.    Physical Exam Vitals reviewed.  HENT:     Mouth/Throat:     Mouth: Mucous membranes are moist.  Cardiovascular:     Rate and Rhythm: Normal rate and regular rhythm.     Heart sounds: Normal heart sounds.  Pulmonary:     Breath sounds: Normal breath sounds.  Abdominal:     Palpations: Abdomen is soft.  Musculoskeletal:        General: Normal range of motion.  Skin:    General: Skin is warm.  Neurological:     Mental Status: She is alert and oriented to person, place, and time.   Psychiatric:        Behavior: Behavior normal.     Imaging: No results found.  Labs:  CBC: Recent Labs    05/29/23 0642 05/30/23 0516 05/31/23 0549 06/01/23 0543  WBC 16.5* 12.5* 12.8* 12.2*  HGB 11.0* 9.8* 9.6* 9.5*  HCT 33.6* 29.7* 29.2* 30.7*  PLT 317 290 287 276    COAGS: No results for input(s): "INR", "APTT" in the last 8760 hours.  BMP: Recent Labs    05/29/23 0642 05/30/23 0516 05/31/23 0549 06/01/23 0543  NA 134* 133* 139 136  K 3.0* 3.3* 3.2* 4.0  CL 104 96* 91* 90*  CO2 17* 24 36* 36*  GLUCOSE 159* 148* 141* 126*  BUN 50* 36* 25* 21  CALCIUM 8.4* 7.9* 8.1* 8.2*  CREATININE 3.38* 3.21* 2.76* 2.81*  GFRNONAA 14* 15* 18* 18*    LIVER FUNCTION TESTS: Recent Labs    05/29/23 0642 05/30/23 0516 05/31/23 0549 06/01/23 0543  BILITOT 0.6 0.3 0.5 0.4  AST 16 16 21 26   ALT 19 18 20 22   ALKPHOS 117 109 116 118  PROT 6.3* 6.0* 6.2* 6.3*  ALBUMIN 2.6* 2.5* 2.7* 2.6*    TUMOR MARKERS: No results for input(s): "AFPTM", "CEA", "CA199", "CHROMGRNA" in the last 8760 hours.  Assessment and Plan:  Scheduled for Port a cath evaluation/possible replacement/ possible fibrin sheath removal Pt is aware of procedure benefits and risks including but not limited to Infection; bleeding; vessel damage Agreeable to proceed Consent signed and in chart   Thank you for this interesting consult.  I greatly enjoyed meeting [name] and look forward to participating in their care.  A copy of this report was sent to the requesting provider on this date.  Electronically Signed: Robet Leu, PA-C 08/02/2023, 1:56 PM   I spent a total of  30 Minutes   in face to face in clinical consultation, greater than 50% of which was counseling/coordinating care for [facility] evaluation

## 2023-08-02 NOTE — Procedures (Signed)
Interventional Radiology Procedure Note  Procedure: Port check  Findings: Please refer to procedural dictation for full description. Reservoir was flipped upon presentation, able to be manipulated back to regular position.  Port check demonstrates patency without evidence of fibrin sheath.  No intervention necessary.  Complications: None immediate  Estimated Blood Loss: < 5 ml  Recommendations: Port OK to use per usual.     Marliss Coots, MD

## 2023-08-02 NOTE — Sedation Documentation (Signed)
Per MD Suttle fibrin sheath not present. Port deaccessed, case ended. Patient educated. VSS. Report called to short stay.

## 2023-08-03 ENCOUNTER — Ambulatory Visit: Payer: Medicare Other

## 2023-08-03 VITALS — BP 158/91 | HR 94 | Temp 98.1°F | Resp 20 | Ht 63.0 in | Wt 190.4 lb

## 2023-08-03 DIAGNOSIS — E860 Dehydration: Secondary | ICD-10-CM

## 2023-08-03 MED ORDER — HEPARIN SOD (PORK) LOCK FLUSH 100 UNIT/ML IV SOLN
500.0000 [IU] | Freq: Once | INTRAVENOUS | Status: AC | PRN
  Administered 2023-08-03: 500 [IU]
  Filled 2023-08-03: qty 5

## 2023-08-03 MED ORDER — SODIUM CHLORIDE 0.9 % IV BOLUS
1000.0000 mL | Freq: Once | INTRAVENOUS | Status: AC
Start: 2023-08-03 — End: 2023-08-03
  Administered 2023-08-03: 1000 mL via INTRAVENOUS
  Filled 2023-08-03: qty 1000

## 2023-08-03 NOTE — Progress Notes (Signed)
Diagnosis: Dehydration  Provider:  Chilton Greathouse MD  Procedure: IV Infusion  IV Type: Port a Cath, IV Location: R Chest  Normal Saline, Dose: 1000 ml  Infusion Start Time: 830  Infusion Stop Time: 0939  Post Infusion IV Care: Port a Cath Deaccessed/Flushed  Discharge: Condition: Good, Destination: Home . AVS Declined  Performed by:  Nat Math, RN

## 2023-08-08 ENCOUNTER — Ambulatory Visit (INDEPENDENT_AMBULATORY_CARE_PROVIDER_SITE_OTHER): Payer: Medicare Other

## 2023-08-08 VITALS — BP 155/84 | HR 85 | Temp 97.8°F | Resp 20 | Ht 63.0 in | Wt 187.8 lb

## 2023-08-08 DIAGNOSIS — E860 Dehydration: Secondary | ICD-10-CM

## 2023-08-08 MED ORDER — HEPARIN SOD (PORK) LOCK FLUSH 100 UNIT/ML IV SOLN
500.0000 [IU] | Freq: Once | INTRAVENOUS | Status: AC | PRN
  Administered 2023-08-08: 500 [IU]
  Filled 2023-08-08: qty 5

## 2023-08-08 MED ORDER — SODIUM CHLORIDE 0.9 % IV BOLUS
1000.0000 mL | Freq: Once | INTRAVENOUS | Status: AC
  Administered 2023-08-08: 1000 mL via INTRAVENOUS
  Filled 2023-08-08: qty 1000

## 2023-08-08 NOTE — Progress Notes (Signed)
Diagnosis: Dehydration  Provider:  Chilton Greathouse MD  Procedure: IV Infusion  IV Type: Port a Cath, IV Location: R Chest  Normal Saline, Dose: 1000 ml  Infusion Start Time: 0832  Infusion Stop Time: 0936  Post Infusion IV Care: Port a Cath Deaccessed/Flushed Heparin locked   Discharge: Condition: Good, Destination: Home . AVS Declined  Performed by:  Adriana Mccallum, RN

## 2023-08-10 ENCOUNTER — Ambulatory Visit: Payer: Medicare Other

## 2023-08-10 VITALS — BP 167/95 | HR 92 | Temp 98.3°F | Resp 20 | Ht 63.0 in | Wt 188.8 lb

## 2023-08-10 DIAGNOSIS — Z932 Ileostomy status: Secondary | ICD-10-CM | POA: Diagnosis not present

## 2023-08-10 DIAGNOSIS — R198 Other specified symptoms and signs involving the digestive system and abdomen: Secondary | ICD-10-CM

## 2023-08-10 DIAGNOSIS — K50819 Crohn's disease of both small and large intestine with unspecified complications: Secondary | ICD-10-CM | POA: Diagnosis not present

## 2023-08-10 DIAGNOSIS — E860 Dehydration: Secondary | ICD-10-CM | POA: Diagnosis not present

## 2023-08-10 MED ORDER — SODIUM CHLORIDE 0.9 % IV BOLUS
1000.0000 mL | Freq: Once | INTRAVENOUS | Status: AC
Start: 2023-08-10 — End: 2023-08-10
  Administered 2023-08-10: 1000 mL via INTRAVENOUS

## 2023-08-10 MED ORDER — SODIUM CHLORIDE 0.9% FLUSH: 10.0000 mL | Freq: Once | INTRAVENOUS | Status: DC | PRN

## 2023-08-10 MED ORDER — OCTREOTIDE ACETATE 20 MG IM KIT
20.0000 mg | PACK | Freq: Once | INTRAMUSCULAR | Status: AC
  Administered 2023-08-10: 20 mg via INTRAMUSCULAR
  Filled 2023-08-10: qty 1

## 2023-08-10 MED ORDER — HEPARIN SOD (PORK) LOCK FLUSH 100 UNIT/ML IV SOLN
500.0000 [IU] | Freq: Once | INTRAVENOUS | Status: AC | PRN
  Administered 2023-08-10: 500 [IU]

## 2023-08-10 NOTE — Progress Notes (Signed)
Diagnosis: Dehydration  Provider:  Chilton Greathouse MD  Procedure: IV Infusion  IV Type: Port a Cath, IV Location: R Chest  Normal Saline, Dose: 1000 ml  Infusion Start Time: 0842  Infusion Stop Time: 0952  Diagnosis: Acute Anemia  Procedure: Injection  Octerotide, Dose: 20 mg, Site: intramuscular, Number of injections: 1  Post Infusion IV Care: Port a Cath Deaccessed/Flushed and Heparin locked.  Discharge: Condition: Good, Destination: Home . AVS Declined  Performed by:  Garnette Czech, RN

## 2023-08-11 ENCOUNTER — Inpatient Hospital Stay: Payer: Medicare Other | Attending: Family

## 2023-08-11 ENCOUNTER — Inpatient Hospital Stay: Payer: Medicare Other | Admitting: Medical Oncology

## 2023-08-11 ENCOUNTER — Other Ambulatory Visit: Payer: Self-pay

## 2023-08-12 ENCOUNTER — Ambulatory Visit (INDEPENDENT_AMBULATORY_CARE_PROVIDER_SITE_OTHER): Payer: Medicare Other

## 2023-08-12 VITALS — BP 144/80 | HR 93 | Temp 98.4°F | Resp 20 | Ht 63.0 in | Wt 188.8 lb

## 2023-08-12 DIAGNOSIS — E860 Dehydration: Secondary | ICD-10-CM | POA: Diagnosis not present

## 2023-08-12 MED ORDER — HEPARIN SOD (PORK) LOCK FLUSH 100 UNIT/ML IV SOLN
500.0000 [IU] | Freq: Once | INTRAVENOUS | Status: AC | PRN
  Administered 2023-08-12: 500 [IU]
  Filled 2023-08-12: qty 5

## 2023-08-12 MED ORDER — SODIUM CHLORIDE 0.9 % IV BOLUS
1000.0000 mL | Freq: Once | INTRAVENOUS | Status: AC
  Administered 2023-08-12: 1000 mL via INTRAVENOUS
  Filled 2023-08-12: qty 1000

## 2023-08-12 NOTE — Progress Notes (Signed)
Diagnosis: Dehydration  Provider:  Chilton Greathouse MD  Procedure: IV Infusion  IV Type: Port a Cath, IV Location: R Chest  Normal Saline, Dose: 1000 ml  Infusion Start Time: 0837  Infusion Stop Time: 0949  Post Infusion IV Care: Port a Cath Deaccessed/Flushed  Discharge: Condition: Good, Destination: Home . AVS Declined  Performed by:  Nat Math, RN

## 2023-08-15 ENCOUNTER — Ambulatory Visit: Payer: Medicare Other

## 2023-08-15 VITALS — BP 147/82 | HR 84 | Temp 97.9°F | Resp 20 | Ht 63.0 in | Wt 188.4 lb

## 2023-08-15 DIAGNOSIS — E860 Dehydration: Secondary | ICD-10-CM | POA: Diagnosis not present

## 2023-08-15 MED ORDER — HEPARIN SOD (PORK) LOCK FLUSH 100 UNIT/ML IV SOLN
500.0000 [IU] | Freq: Once | INTRAVENOUS | Status: AC | PRN
  Administered 2023-08-15: 500 [IU]
  Filled 2023-08-15: qty 5

## 2023-08-15 MED ORDER — SODIUM CHLORIDE 0.9 % IV BOLUS
1000.0000 mL | Freq: Once | INTRAVENOUS | Status: AC
  Administered 2023-08-15: 1000 mL via INTRAVENOUS

## 2023-08-15 NOTE — Progress Notes (Signed)
Diagnosis: Dehydration  Provider:  Chilton Greathouse MD  Procedure: IV Infusion  IV Type: Port a Cath, IV Location: R Chest  Normal Saline, Dose: 1000 ml  Infusion Start Time: 0829  Infusion Stop Time: 0934  Post Infusion IV Care: Port a Cath Deaccessed/Flushed  Discharge: Condition: Good, Destination: Home . AVS Declined  Performed by:  Nat Math, RN

## 2023-08-17 ENCOUNTER — Ambulatory Visit: Payer: Medicare Other

## 2023-08-17 VITALS — BP 148/89 | HR 90 | Temp 97.8°F | Resp 20 | Ht 63.0 in | Wt 190.4 lb

## 2023-08-17 DIAGNOSIS — E860 Dehydration: Secondary | ICD-10-CM

## 2023-08-17 MED ORDER — SODIUM CHLORIDE 0.9 % IV BOLUS
1000.0000 mL | Freq: Once | INTRAVENOUS | Status: AC
  Administered 2023-08-17: 1000 mL via INTRAVENOUS

## 2023-08-17 MED ORDER — HEPARIN SOD (PORK) LOCK FLUSH 100 UNIT/ML IV SOLN
500.0000 [IU] | Freq: Once | INTRAVENOUS | Status: AC | PRN
  Administered 2023-08-17: 500 [IU]
  Filled 2023-08-17: qty 5

## 2023-08-17 NOTE — Progress Notes (Signed)
Diagnosis: Dehydration  Provider:  Chilton Greathouse MD  Procedure: IV Infusion  IV Type: Port a Cath, IV Location: R Chest  Normal Saline, Dose: 1000 ml  Infusion Start Time: 0838  Infusion Stop Time: 0944  Post Infusion IV Care: Patient declined observation and Port a Cath Deaccessed/Flushed and heparin locked  Discharge: Condition: Good, Destination: Home . AVS Declined  Performed by:  Wyvonne Lenz, RN

## 2023-08-19 ENCOUNTER — Ambulatory Visit: Payer: Medicare Other

## 2023-08-19 VITALS — BP 139/84 | HR 94 | Temp 97.6°F | Resp 22 | Ht 63.0 in | Wt 189.2 lb

## 2023-08-19 DIAGNOSIS — E860 Dehydration: Secondary | ICD-10-CM | POA: Diagnosis not present

## 2023-08-19 MED ORDER — SODIUM CHLORIDE 0.9 % IV BOLUS
1000.0000 mL | Freq: Once | INTRAVENOUS | Status: AC
Start: 2023-08-19 — End: 2023-08-19
  Administered 2023-08-19: 1000 mL via INTRAVENOUS

## 2023-08-19 MED ORDER — HEPARIN SOD (PORK) LOCK FLUSH 100 UNIT/ML IV SOLN
500.0000 [IU] | Freq: Once | INTRAVENOUS | Status: AC | PRN
  Administered 2023-08-19: 500 [IU]
  Filled 2023-08-19: qty 5

## 2023-08-19 NOTE — Progress Notes (Signed)
Diagnosis: Dehydration  Provider:  Chilton Greathouse MD  Procedure: IV Infusion  IV Type: Port a Cath, IV Location: L Chest  Normal Saline, Dose: 1000 ml  Infusion Start Time: 0827  Infusion Stop Time: 0936  Post Infusion IV Care: Patient declined observation  Discharge: Condition: Good, Destination: Home . AVS Declined  Performed by:  Wyvonne Lenz, RN

## 2023-08-22 ENCOUNTER — Ambulatory Visit (INDEPENDENT_AMBULATORY_CARE_PROVIDER_SITE_OTHER): Payer: Medicare Other

## 2023-08-22 VITALS — BP 149/81 | HR 87 | Temp 98.3°F | Resp 20 | Ht 63.0 in | Wt 189.4 lb

## 2023-08-22 DIAGNOSIS — E860 Dehydration: Secondary | ICD-10-CM | POA: Diagnosis not present

## 2023-08-22 MED ORDER — SODIUM CHLORIDE 0.9 % IV BOLUS
1000.0000 mL | Freq: Once | INTRAVENOUS | Status: AC
  Administered 2023-08-22: 1000 mL via INTRAVENOUS

## 2023-08-22 MED ORDER — HEPARIN SOD (PORK) LOCK FLUSH 100 UNIT/ML IV SOLN
500.0000 [IU] | Freq: Once | INTRAVENOUS | Status: AC | PRN
  Administered 2023-08-22: 500 [IU]
  Filled 2023-08-22: qty 5

## 2023-08-22 NOTE — Progress Notes (Signed)
Diagnosis: Dehydration  Provider:  Chilton Greathouse MD  Procedure: IV Infusion  IV Type: Port a Cath, IV Location: R Chest  Normal Saline, Dose: 1000 ml  Infusion Start Time: 0824  Infusion Stop Time: 0928  Post Infusion IV Care: Port a Cath Deaccessed/Flushed  Discharge: Condition: Good, Destination: Home . AVS Declined  Performed by:  Nat Math, RN

## 2023-08-24 ENCOUNTER — Ambulatory Visit: Payer: Medicare Other

## 2023-08-24 VITALS — BP 157/87 | HR 96 | Temp 97.8°F | Resp 16 | Ht 63.0 in | Wt 190.0 lb

## 2023-08-24 DIAGNOSIS — E860 Dehydration: Secondary | ICD-10-CM | POA: Diagnosis not present

## 2023-08-24 MED ORDER — HEPARIN SOD (PORK) LOCK FLUSH 100 UNIT/ML IV SOLN
500.0000 [IU] | Freq: Once | INTRAVENOUS | Status: AC | PRN
Start: 2023-08-24 — End: 2023-08-24
  Administered 2023-08-24: 500 [IU]
  Filled 2023-08-24: qty 5

## 2023-08-24 MED ORDER — SODIUM CHLORIDE 0.9 % IV BOLUS
1000.0000 mL | Freq: Once | INTRAVENOUS | Status: AC
  Administered 2023-08-24: 1000 mL via INTRAVENOUS
  Filled 2023-08-24: qty 1000

## 2023-08-24 NOTE — Progress Notes (Signed)
Diagnosis: Dehydration  Provider:  Chilton Greathouse MD  Procedure: IV Infusion  IV Type: Port a Cath, IV Location: R Chest  Normal Saline, Dose: 1000 ml  Infusion Start Time: 0822  Infusion Stop Time: 0929  Post Infusion IV Care: Patient declined observation and Port a Cath Deaccessed/Flushed and heparin locked  Discharge: Condition: Good, Destination: Home . AVS Provided  Performed by:  Wyvonne Lenz, RN

## 2023-08-26 ENCOUNTER — Ambulatory Visit: Payer: Medicare Other

## 2023-08-26 VITALS — BP 163/94 | HR 81 | Temp 98.0°F | Resp 22 | Ht 63.0 in | Wt 189.4 lb

## 2023-08-26 DIAGNOSIS — E860 Dehydration: Secondary | ICD-10-CM

## 2023-08-26 MED ORDER — HEPARIN SOD (PORK) LOCK FLUSH 100 UNIT/ML IV SOLN
500.0000 [IU] | Freq: Once | INTRAVENOUS | Status: AC | PRN
Start: 2023-08-26 — End: 2023-08-26
  Administered 2023-08-26: 500 [IU]
  Filled 2023-08-26: qty 5

## 2023-08-26 MED ORDER — SODIUM CHLORIDE 0.9 % IV BOLUS
1000.0000 mL | Freq: Once | INTRAVENOUS | Status: AC
  Administered 2023-08-26: 1000 mL via INTRAVENOUS

## 2023-08-26 NOTE — Progress Notes (Signed)
Diagnosis: Dehydration  Provider:  Chilton Greathouse MD  Procedure: IV Infusion  IV Type: Port a Cath, IV Location: R Chest  Normal Saline, Dose: 1000 ml  Infusion Start Time: 0827  Infusion Stop Time: 0934  Post Infusion IV Care: Patient declined observation and Port a Cath Deaccessed/Flushed and heparin locked  Discharge: Condition: Good, Destination: Home . AVS Declined  Performed by:  Wyvonne Lenz, RN

## 2023-08-29 ENCOUNTER — Ambulatory Visit: Payer: Medicare Other

## 2023-08-29 VITALS — BP 154/91 | HR 101 | Temp 98.2°F | Resp 18 | Ht 63.0 in | Wt 189.2 lb

## 2023-08-29 DIAGNOSIS — E860 Dehydration: Secondary | ICD-10-CM | POA: Diagnosis not present

## 2023-08-29 MED ORDER — SODIUM CHLORIDE 0.9 % IV BOLUS
1000.0000 mL | Freq: Once | INTRAVENOUS | Status: AC
Start: 2023-08-29 — End: 2023-08-29
  Administered 2023-08-29: 1000 mL via INTRAVENOUS
  Filled 2023-08-29: qty 1000

## 2023-08-29 NOTE — Progress Notes (Cosign Needed Addendum)
Diagnosis: Dehydration  Provider:  Chilton Greathouse MD  Procedure: IV Infusion  IV Type: Peripheral, IV Location: L Antecubital  Normal Saline, Dose: 1000 ml  Infusion Start Time: 0904  Infusion Stop Time: 1013  Unable get blood return from the port. Attending Md called and left voicemail.Pt advised to call [facility] radiology to check her port. Pt verbalized understanding.  Post Infusion IV Care: Peripheral IV Discontinued  Discharge: Condition: Good, Destination: Home . AVS Declined  Performed by:  Garnette Czech, RN

## 2023-09-02 ENCOUNTER — Ambulatory Visit (INDEPENDENT_AMBULATORY_CARE_PROVIDER_SITE_OTHER): Payer: Medicare Other

## 2023-09-02 VITALS — BP 146/84 | HR 90 | Temp 97.6°F | Resp 20 | Ht 63.0 in | Wt 187.8 lb

## 2023-09-02 DIAGNOSIS — E860 Dehydration: Secondary | ICD-10-CM

## 2023-09-02 MED ORDER — SODIUM CHLORIDE 0.9 % IV BOLUS
1000.0000 mL | Freq: Once | INTRAVENOUS | Status: AC
  Administered 2023-09-02: 1000 mL via INTRAVENOUS
  Filled 2023-09-02: qty 1000

## 2023-09-02 MED ORDER — HEPARIN SOD (PORK) LOCK FLUSH 100 UNIT/ML IV SOLN
500.0000 [IU] | Freq: Once | INTRAVENOUS | Status: DC | PRN
  Filled 2023-09-02: qty 5

## 2023-09-02 NOTE — Progress Notes (Signed)
Diagnosis: Dehydration  Provider:  Chilton Greathouse MD  Procedure: IV Infusion  IV Type: Port a Cath, IV Location: R Chest  Normal Saline, Dose: 1000 ml  Infusion Start Time: 0828  Infusion Stop Time: 0935  Post Infusion IV Care: Port a Cath Deaccessed/Flushed  Discharge: Condition: Good, Destination: Home . AVS Declined  Performed by:  Nat Math, RN

## 2023-09-05 ENCOUNTER — Ambulatory Visit (INDEPENDENT_AMBULATORY_CARE_PROVIDER_SITE_OTHER): Payer: Medicare Other

## 2023-09-05 VITALS — BP 169/91 | HR 119 | Temp 98.3°F | Resp 16 | Ht 63.0 in | Wt 187.4 lb

## 2023-09-05 DIAGNOSIS — E860 Dehydration: Secondary | ICD-10-CM | POA: Diagnosis not present

## 2023-09-05 MED ORDER — SODIUM CHLORIDE 0.9% FLUSH
10.0000 mL | Freq: Once | INTRAVENOUS | Status: AC | PRN
  Administered 2023-09-05: 10 mL

## 2023-09-05 MED ORDER — HEPARIN SOD (PORK) LOCK FLUSH 100 UNIT/ML IV SOLN
500.0000 [IU] | Freq: Once | INTRAVENOUS | Status: AC | PRN
  Administered 2023-09-05: 500 [IU]
  Filled 2023-09-05: qty 5

## 2023-09-05 MED ORDER — SODIUM CHLORIDE 0.9 % IV BOLUS
1000.0000 mL | Freq: Once | INTRAVENOUS | Status: AC
  Administered 2023-09-05: 1000 mL via INTRAVENOUS
  Filled 2023-09-05: qty 1000

## 2023-09-05 NOTE — Progress Notes (Signed)
Diagnosis: Dehydration  Provider:  Chilton Greathouse MD  Procedure: IV Infusion  IV Type: Port a Cath, IV Location: R Chest  Normal Saline, Dose: 1000 ml  Infusion Start Time: 0827  Infusion Stop Time: 0928  Post Infusion IV Care: Port a Cath Deaccessed/Flushed, Heparin locked.  Discharge: Condition: Good, Destination: Home . AVS Declined  Performed by:  Adriana Mccallum, RN

## 2023-09-08 ENCOUNTER — Ambulatory Visit (INDEPENDENT_AMBULATORY_CARE_PROVIDER_SITE_OTHER): Payer: Medicare Other

## 2023-09-08 VITALS — BP 153/84 | HR 98 | Temp 97.9°F | Resp 20 | Ht 63.0 in | Wt 189.8 lb

## 2023-09-08 DIAGNOSIS — E860 Dehydration: Secondary | ICD-10-CM

## 2023-09-08 MED ORDER — HEPARIN SOD (PORK) LOCK FLUSH 100 UNIT/ML IV SOLN
500.0000 [IU] | Freq: Once | INTRAVENOUS | Status: AC | PRN
  Administered 2023-09-08: 500 [IU]
  Filled 2023-09-08: qty 5

## 2023-09-08 MED ORDER — SODIUM CHLORIDE 0.9 % IV BOLUS
1000.0000 mL | Freq: Once | INTRAVENOUS | Status: AC
  Administered 2023-09-08: 1000 mL via INTRAVENOUS
  Filled 2023-09-08: qty 1000

## 2023-09-08 NOTE — Progress Notes (Signed)
 Diagnosis: Dehydration  Provider:  Praveen Mannam MD  Procedure: IV Infusion  IV Type: Port a Cath, IV Location: R Chest  Normal Saline, Dose: 1000 ml  Infusion Start Time: 1308  Infusion Stop Time: 1411  Post Infusion IV Care: Port a Cath Deaccessed/Flushed  Discharge: Condition: Good, Destination: Home . AVS Declined  Performed by:  Donny Childes, RN

## 2023-09-09 ENCOUNTER — Ambulatory Visit: Payer: Medicare Other

## 2023-09-09 VITALS — BP 132/78 | HR 105 | Temp 97.7°F | Resp 20 | Ht 63.0 in | Wt 192.0 lb

## 2023-09-09 DIAGNOSIS — K50819 Crohn's disease of both small and large intestine with unspecified complications: Secondary | ICD-10-CM

## 2023-09-09 DIAGNOSIS — E860 Dehydration: Secondary | ICD-10-CM | POA: Diagnosis not present

## 2023-09-09 DIAGNOSIS — Z932 Ileostomy status: Secondary | ICD-10-CM | POA: Diagnosis not present

## 2023-09-09 DIAGNOSIS — R198 Other specified symptoms and signs involving the digestive system and abdomen: Secondary | ICD-10-CM

## 2023-09-09 MED ORDER — HEPARIN SOD (PORK) LOCK FLUSH 100 UNIT/ML IV SOLN
500.0000 [IU] | Freq: Once | INTRAVENOUS | Status: AC | PRN
  Administered 2023-09-09: 500 [IU]
  Filled 2023-09-09: qty 5

## 2023-09-09 MED ORDER — OCTREOTIDE ACETATE 20 MG IM KIT
20.0000 mg | PACK | Freq: Once | INTRAMUSCULAR | Status: AC
  Administered 2023-09-09: 20 mg via INTRAMUSCULAR
  Filled 2023-09-09: qty 1

## 2023-09-09 MED ORDER — SODIUM CHLORIDE 0.9 % IV BOLUS
1000.0000 mL | Freq: Once | INTRAVENOUS | Status: AC
  Administered 2023-09-09: 1000 mL via INTRAVENOUS

## 2023-09-09 NOTE — Progress Notes (Signed)
 Diagnosis: Dehydration  Provider:  Mannam, Praveen MD  Procedure: IV Infusion  IV Type: Port a Cath, IV Location: R Chest  Normal Saline, Dose: 1000 ml  Infusion Start Time: 0821  Infusion Stop Time: 0929  Post Infusion IV Care: Port a Cath Deaccessed/Flushed and heparin  locked  Discharge: Condition: Stable, Destination: Home . AVS Declined  Performed by:  Rocky FORBES Sar, RN

## 2023-09-12 ENCOUNTER — Ambulatory Visit: Payer: Medicare Other

## 2023-09-12 ENCOUNTER — Telehealth: Payer: Self-pay | Admitting: Gastroenterology

## 2023-09-12 VITALS — BP 146/82 | HR 90 | Temp 97.9°F | Resp 20 | Ht 63.0 in | Wt 190.8 lb

## 2023-09-12 DIAGNOSIS — E860 Dehydration: Secondary | ICD-10-CM | POA: Diagnosis not present

## 2023-09-12 MED ORDER — HEPARIN SOD (PORK) LOCK FLUSH 100 UNIT/ML IV SOLN
500.0000 [IU] | Freq: Once | INTRAVENOUS | Status: AC | PRN
  Administered 2023-09-12: 500 [IU]
  Filled 2023-09-12: qty 5

## 2023-09-12 MED ORDER — SODIUM CHLORIDE 0.9 % IV BOLUS
1000.0000 mL | Freq: Once | INTRAVENOUS | Status: AC
  Administered 2023-09-12: 1000 mL via INTRAVENOUS
  Filled 2023-09-12: qty 1000

## 2023-09-12 NOTE — Telephone Encounter (Signed)
 Inbound call from patient stating she has been having lower abdominal pain. Patient is requesting a call to discuss options to help. Please advise, thank you.

## 2023-09-12 NOTE — Progress Notes (Signed)
 Diagnosis: Dehydration  Provider:  Praveen Mannam MD  Procedure: IV Infusion  IV Type: Port a Cath, IV Location: R Chest  Normal Saline, Dose: 1000 ml  Infusion Start Time: 0841  Infusion Stop Time: 0947  Post Infusion IV Care: Port a Cath Deaccessed/Flushed  Discharge: Condition: Good, Destination: Home . AVS Declined  Performed by:  Mellanie Bejarano, RN

## 2023-09-12 NOTE — Telephone Encounter (Signed)
 Returned call to patient. Pt reports having pain in her lower abdomen, pt states that it feels like trapped gas. She notices it after she eats and lies down. Pt states that she is burping and passing gas through her pouch. Pt denies any abdominal distention, but feels tight sometimes. Pt has not been taking Gas X, pt has been advised to start taking Gas-X after meals and see how she feels doing that. Pt verbalized understanding and had no concerns at the end of the call.

## 2023-09-13 ENCOUNTER — Telehealth: Payer: Self-pay | Admitting: *Deleted

## 2023-09-13 ENCOUNTER — Encounter: Payer: Self-pay | Admitting: Family Medicine

## 2023-09-13 ENCOUNTER — Ambulatory Visit (HOSPITAL_BASED_OUTPATIENT_CLINIC_OR_DEPARTMENT_OTHER)
Admission: RE | Admit: 2023-09-13 | Discharge: 2023-09-13 | Disposition: A | Discharge status: Home / Self Care | Payer: Medicare Other | Source: Ambulatory Visit | Attending: Family Medicine | Admitting: Family Medicine

## 2023-09-13 ENCOUNTER — Telehealth: Payer: Self-pay | Admitting: Pharmacy Technician

## 2023-09-13 ENCOUNTER — Ambulatory Visit (INDEPENDENT_AMBULATORY_CARE_PROVIDER_SITE_OTHER): Payer: Medicare Other | Admitting: Family Medicine

## 2023-09-13 ENCOUNTER — Other Ambulatory Visit: Payer: Self-pay

## 2023-09-13 VITALS — BP 152/98 | HR 95 | Temp 98.2°F | Resp 22 | Ht 63.0 in | Wt 191.4 lb

## 2023-09-13 DIAGNOSIS — R0600 Dyspnea, unspecified: Secondary | ICD-10-CM | POA: Diagnosis not present

## 2023-09-13 DIAGNOSIS — Z452 Encounter for adjustment and management of vascular access device: Secondary | ICD-10-CM | POA: Diagnosis not present

## 2023-09-13 DIAGNOSIS — R0683 Snoring: Secondary | ICD-10-CM | POA: Insufficient documentation

## 2023-09-13 DIAGNOSIS — D509 Iron deficiency anemia, unspecified: Secondary | ICD-10-CM

## 2023-09-13 DIAGNOSIS — N184 Chronic kidney disease, stage 4 (severe): Secondary | ICD-10-CM | POA: Diagnosis not present

## 2023-09-13 DIAGNOSIS — G603 Idiopathic progressive neuropathy: Secondary | ICD-10-CM | POA: Diagnosis not present

## 2023-09-13 DIAGNOSIS — R0602 Shortness of breath: Secondary | ICD-10-CM | POA: Diagnosis not present

## 2023-09-13 DIAGNOSIS — E785 Hyperlipidemia, unspecified: Secondary | ICD-10-CM | POA: Diagnosis not present

## 2023-09-13 LAB — CBC WITH DIFFERENTIAL/PLATELET
Basophils Absolute: 0.1 10*3/uL (ref 0.0–0.1)
Basophils Relative: 0.9 % (ref 0.0–3.0)
Eosinophils Absolute: 0.5 10*3/uL (ref 0.0–0.7)
Eosinophils Relative: 4.1 % (ref 0.0–5.0)
HCT: 32.4 % — ABNORMAL LOW (ref 36.0–46.0)
Hemoglobin: 10.4 g/dL — ABNORMAL LOW (ref 12.0–15.0)
Lymphocytes Relative: 17.8 % (ref 12.0–46.0)
Lymphs Abs: 2.1 10*3/uL (ref 0.7–4.0)
MCHC: 32.1 g/dL (ref 30.0–36.0)
MCV: 104.1 fL — ABNORMAL HIGH (ref 78.0–100.0)
Monocytes Absolute: 0.7 10*3/uL (ref 0.1–1.0)
Monocytes Relative: 5.9 % (ref 3.0–12.0)
Neutro Abs: 8.5 10*3/uL — ABNORMAL HIGH (ref 1.4–7.7)
Neutrophils Relative %: 71.3 % (ref 43.0–77.0)
Platelets: 351 10*3/uL (ref 150.0–400.0)
RBC: 3.11 Mil/uL — ABNORMAL LOW (ref 3.87–5.11)
RDW: 15.9 % — ABNORMAL HIGH (ref 11.5–15.5)
WBC: 11.9 10*3/uL — ABNORMAL HIGH (ref 4.0–10.5)

## 2023-09-13 LAB — COMPREHENSIVE METABOLIC PANEL
ALT: 14 U/L (ref 0–35)
AST: 20 U/L (ref 0–37)
Albumin: 4 g/dL (ref 3.5–5.2)
Alkaline Phosphatase: 138 U/L — ABNORMAL HIGH (ref 39–117)
BUN: 42 mg/dL — ABNORMAL HIGH (ref 6–23)
CO2: 15 meq/L — ABNORMAL LOW (ref 19–32)
Calcium: 7.4 mg/dL — ABNORMAL LOW (ref 8.4–10.5)
Chloride: 108 meq/L (ref 96–112)
Creatinine, Ser: 3.56 mg/dL — ABNORMAL HIGH (ref 0.40–1.20)
GFR: 12.5 mL/min — CL (ref >60.00)
Glucose, Bld: 126 mg/dL — ABNORMAL HIGH (ref 70–99)
Potassium: 3.8 meq/L (ref 3.5–5.1)
Sodium: 137 meq/L (ref 135–145)
Total Bilirubin: 0.4 mg/dL (ref 0.2–1.2)
Total Protein: 7.2 g/dL (ref 6.0–8.3)

## 2023-09-13 LAB — LIPID PANEL
Cholesterol: 231 mg/dL — ABNORMAL HIGH (ref 0–200)
HDL: 52.3 mg/dL (ref >39.00)
LDL Cholesterol: 127 mg/dL — ABNORMAL HIGH (ref 0–99)
NonHDL: 178.6
Total CHOL/HDL Ratio: 4
Triglycerides: 260 mg/dL — ABNORMAL HIGH (ref 0.0–149.0)
VLDL: 52 mg/dL — ABNORMAL HIGH (ref 0.0–40.0)

## 2023-09-13 LAB — TSH: TSH: 2.35 u[IU]/mL (ref 0.35–5.50)

## 2023-09-13 LAB — VITAMIN B12: Vitamin B-12: 309 pg/mL (ref 211–911)

## 2023-09-13 NOTE — Progress Notes (Signed)
 Established Patient Office Visit  Subjective   Patient ID: Ragna Kramlich, female    DOB: 10/09/1955  Age: 68 y.o. MRN: [id number]  Chief Complaint  Patient presents with   Shortness of Breath    HPI Discussed the use of AI scribe software for clinical note transcription with the patient, who gave verbal consent to proceed.  History of Present Illness   The patient presents with worsening shortness of breath, particularly noticeable upon exertion. She also reports a progressive worsening of neuropathy in her feet, describing the sensation as walking on rocks. Despite trying gabapentin , she found no relief. The patient also reports difficulty with driving due to the neuropathy, occasionally pressing the wrong pedal.  The patient's breathing is comfortable at rest, but she becomes dyspneic with movement. This has led to a decrease in her physical activity. Family members have expressed concern about the patient's breathing and have suggested the need for supplemental oxygen.  The patient also reports nocturnal enuresis, despite wearing a diaper, and acknowledges snoring and nocturnal restlessness. She admits to a lack of sufficient sleep, often not falling asleep until the early hours of the morning.  The patient's neuropathy and respiratory symptoms have made it increasingly difficult for her to navigate stairs. Despite these challenges, she continues to be active in caring for her mother, who is currently hospitalized.  The patient has been taking Nervia for her neuropathy, which she reports as being somewhat helpful. She has a history of kidney disease, which limits her options for additional medication management.      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 07/08/2022   High anion gap metabolic acidosis 07/06/2022   GERD without esophagitis 07/06/2022   Orthostatic hypotension 07/06/2022   Ileostomy, has currently (HCC) 06/30/2022   Hypotension due to drugs 06/29/2022   Urinary frequency 03/05/2022   Neck pain 12/08/2021   Facial numbness 12/08/2021   Tooth abscess 11/10/2021   Surgical wound breakdown, initial encounter 05/27/2021   Crohn's disease of both small and large intestine (HCC) 05/20/2021   Acute kidney injury superimposed on chronic kidney disease V (HCC) 05/14/2021   Thrombocytosis 05/14/2021   SIRS (systemic inflammatory response syndrome) (HCC) 05/14/2021   Chronic pain of left knee 03/10/2021   Depression, major, single episode, severe (HCC) 12/18/2020   Closed fracture of left tibial plateau 10/30/2020   Diarrhea with dehydration 09/21/2020   Chronic diarrhea 08/23/2020   Chronic metabolic acidosis 08/23/2020   Hyperglycemia 02/26/2020   Immunologic deficiency syndrome (HCC) 02/26/2020   Prothrombin gene mutation (HCC) 02/26/2020   Acute pain of left shoulder 06/04/2019   Pes anserine bursitis 04/12/2018   Low back pain 06/21/2017   Gout 06/21/2017   Superficial thrombophlebitis of right upper extremity 03/23/2017   Acute basilic vein thrombosis, right 02/16/2017   Symptomatic anemia 01/15/2017   Rectal pain 12/01/2015   IBD (inflammatory bowel disease) 10/24/2015   Anal stricture 10/24/2015   Ovarian cyst, right 06/12/2014   Acute on chronic renal failure (HCC) 04/02/2013   Hydrosalpinx  02/14/2013   Hyperparathyroidism (HCC) 09/18/2012   HSV (herpes simplex virus) anogenital infection 06/28/2012   Stage 4 chronic kidney disease (HCC) 06/17/2012   Left lower  quadrant abdominal pain 06/22/2011   RESTLESS LEGS SYNDROME 08/05/2009   PULMONARY NODULE, RIGHT UPPER LOBE 02/28/2009   TOBACCO ABUSE, HX OF 02/11/2009   PAIN IN JOINT, MULTIPLE SITES 02/04/2009   Nonspecific (abnormal) findings on radiological and other examination of body structure 12/23/2008   CT, CHEST, ABNORMAL 12/23/2008   HEMANGIOMA SIMPLEX 12/16/2008   SKIN RASH 12/16/2008   TINEA CORPORIS 04/18/2008   LATERAL EPICONDYLITIS, RIGHT 04/18/2008   Morbid obesity (HCC) 02/05/2008   ANAL STENOSIS 02/05/2008   Stool culture positive for Clostridioides difficile 02/05/2008   Personal history of other endocrine, metabolic, and immunity disorders 02/05/2008   History of Crohn's disease 02/05/2008   Syncope and collapse 02/02/2008   Dehydration 12/15/2007   ANEMIA-IRON  DEFICIENCY 12/15/2007   LEUKOCYTOSIS 12/15/2007   Anxiety state 12/15/2007   Disorder resulting from impaired renal function 11/16/2007   TINNITUS NOS 05/25/2007   DE QUERVAIN'S TENOSYNOVITIS 05/25/2007   Hyperlipidemia 10/23/2006   Essential hypertension 10/23/2006   Ulcerative colitis without complications (HCC) 07/28/2004   GASTRIC POLYP 11/16/2001   Past Medical History:  Diagnosis Date   Abdominal pain    Hx   Acute pyelonephritis 12/07/2020   Allergy    Anal stenosis    Anemia    Anxiety    Arthritis    Asthma    patient does not have inhaler   Blood in stool    Hx   Blood in urine    Hx   Blood transfusion without reported diagnosis    Cataract    CKD (chronic kidney disease) stage 3, GFR 30-59 ml/min (HCC)    Crohn's colitis, other complication (HCC)    De Quervain's tenosynovitis    Depression    Difficulty urinating    Hx   Easy bruising    Esophagitis    Fainting    History - resolved - due to dehydration   Fatigue    Hx   Fibroid    Hx   Gastric polyp    GERD (gastroesophageal reflux disease)    Hearing loss    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   Hemorrhoids, external     Hemorrhoids, internal    Herpes, genital    vaginal treated 07/05/12 and pt states is resolved   History of cervical dysplasia    History of small bowel obstruction    Hyperlipidemia    currently no meds   Hyperparathyroidism    Hypertension    Hypokalemia    Hx   Hypotension 06/29/2022   IBD (inflammatory bowel disease)    initially colectomy for suspected UC, 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   Obesity    Ovarian cyst    Pain in joint, pelvic region and thigh 12/16/2008   Centricity Description: HIP PAIN, LEFT  Qualifier: Diagnosis of   By: Antonio ROSALEA Rockers      Centricity Description: HIP PAIN  Qualifier: Diagnosis of   By: Antonio ROSALEA Rockers       Pain of right thumb 04/12/2018   Poor dental hygiene    Pulmonary nodule, right    right upper lobe   Rectal bleeding    Hx   Rectal pain    Hx   Renal insufficiency    CKD -  stage 3   RLS (restless legs syndrome)    no meds   Sepsis secondary to UTI (HCC) 06/29/2022   Severe sepsis (HCC) 12/07/2020   Tooth infection 11/2016   right low   Ulcerative colitis    Visual disturbance    wears glasses   Weakness generalized    Hx - patient denies generalized weakness   Wears dentures    upper only   Past Surgical History:  Procedure Laterality Date   ANAL DILATION     BIOPSY  07/20/2022   Procedure: BIOPSY;  Surgeon: Aneita Gwendlyn DASEN, MD;  Location: [facility];  Service: Gastroenterology;;   CERVICAL BIOPSY  W/ LOOP ELECTRODE EXCISION     CHOLECYSTECTOMY     COLONOSCOPY     [name]   fatty tumor removed from back     X 2   HEMORRHOID SURGERY     ILEOSCOPY N/A 07/20/2022   Procedure: ILEOSCOPY THROUGH STOMA;  Surgeon: Aneita Gwendlyn DASEN, MD;  Location: [facility];  Service: Gastroenterology;  Laterality: N/A;   ILEOSTOMY CLOSURE     IR CV LINE INJECTION  05/30/2023   IR CV LINE INJECTION  08/02/2023   IR IMAGING GUIDED PORT INSERTION  04/12/2023   RESTORATIVE  PROCTOCOLECTOMY     with insertion of ileoanal J Pouch with loop ileostomy   SHOULDER ARTHROSCOPY WITH CAPSULORRHAPHY Left 06/14/2019   Procedure: LEFT SHOULDER ARTHRSCOPIC REPAIR OF BONY BANKART FRACTURE;  Surgeon: Dozier Soulier, MD;  Location: [facility];  Service: Orthopedics;  Laterality: Left;  REQUEST 90 MINUTE   SIGMOIDOSCOPY     TOTAL ABDOMINAL HYSTERECTOMY  1998   TAH/LSO   TUBAL LIGATION     UPPER GASTROINTESTINAL ENDOSCOPY     [name]   Social History   Tobacco Use   Smoking status: Former    Current packs/day: 0.00    Average packs/day: 1 pack/day for 4.0 years (4.0 ttl pk-yrs)    Types: Cigarettes    Start date: 05/21/1975    Quit date: 05/21/1979    Years since quitting: 44.3   Smokeless tobacco: Never  Vaping Use   Vaping status: Never Used  Substance Use Topics   Alcohol use: No    Alcohol/week: 0.0 standard drinks of alcohol   Drug use: No   Social History   Socioeconomic History   Marital status: Divorced    Spouse name: Not on file   Number of children: Not on file   Years of education: Not on file   Highest education level: Not on file  Occupational History   Not on file  Tobacco Use   Smoking status: Former    Current packs/day: 0.00    Average packs/day: 1 pack/day for 4.0 years (4.0 ttl pk-yrs)    Types: Cigarettes    Start date: 05/21/1975    Quit date: 05/21/1979    Years since quitting: 44.3   Smokeless tobacco: Never  Vaping Use   Vaping status: Never Used  Substance and Sexual Activity   Alcohol use: No    Alcohol/week: 0.0 standard drinks of alcohol   Drug use: No   Sexual activity: Yes    Birth control/protection: Post-menopausal    Comment: 1st intercourse 68 yo-Fewer than 5 partners  Other Topics Concern   Not on file  Social History Narrative   Not on file   Social Drivers of Health   Financial Resource Strain: Low Risk  (05/26/2022)   Overall Financial Resource Strain (CARDIA)    Difficulty of Paying  Living Expenses: Not hard  at all  Food Insecurity: No Food Insecurity (05/28/2023)   Hunger Vital Sign    Worried About Running Out of Food in the Last Year: Never true    Ran Out of Food in the Last Year: Never true  Transportation Needs: No Transportation Needs (05/28/2023)   PRAPARE - Administrator, Civil Service (Medical): No    Lack of Transportation (Non-Medical): No  Physical Activity: Inactive (05/26/2022)   Exercise Vital Sign    Days of Exercise per Week: 0 days    Minutes of Exercise per Session: 0 min  Stress: No Stress Concern Present (05/26/2022)   [employer] of Occupational Health - Occupational Stress Questionnaire    Feeling of Stress : Not at all  Social Connections: Moderately Isolated (05/26/2022)   Social Connection and Isolation Panel [NHANES]    Frequency of Communication with Friends and Family: More than three times a week    Frequency of Social Gatherings with Friends and Family: More than three times a week    Attends Religious Services: Never    Database Administrator or Organizations: No    Attends Banker Meetings: Never    Marital Status: Married  Catering Manager Violence: Not At Risk (05/28/2023)   Humiliation, Afraid, Rape, and Kick questionnaire    Fear of Current or Ex-Partner: No    Emotionally Abused: No    Physically Abused: No    Sexually Abused: No   Family Status  Relation Name Status   Mother  Alive   Father  Deceased   Sister  Alive   Nurse, Mental Health  (Not Specified)   Daughter  (Not Specified)   Other  (Not Specified)   Neg Hx  (Not Specified)  No partnership data on file   Family History  Problem Relation Age of Onset   Hypertension Mother    Heart disease Mother        s/p pci   Ulcerative colitis Father    Hypertension Father    Heart attack Father    Diabetes Sister    Cancer Sister        uterine   Pulmonary fibrosis Sister    Cancer Maternal Uncle        LUNG   Ulcerative colitis Daughter    Liver disease Daughter     Irritable bowel syndrome Other        grandchildren   Colon cancer Neg Hx    Esophageal cancer Neg Hx    Stomach cancer Neg Hx    Rectal cancer Neg Hx    Allergies  Allergen Reactions   Sulfa Antibiotics Hives, Itching, Rash and Swelling   Morphine Other (See Comments)    Gives me crazy dreams (delusions, also) Psychosis, also    Sulfonamide Derivatives Hives, Itching and Swelling      Review of Systems  Constitutional:  Negative for chills, fever and malaise/fatigue.  HENT:  Negative for congestion and hearing loss.   Eyes:  Negative for discharge.  Respiratory:  Positive for shortness of breath. Negative for cough and sputum production.   Cardiovascular:  Negative for chest pain, palpitations and leg swelling.  Gastrointestinal: Negative.  Negative for abdominal pain, blood in stool, constipation, diarrhea, heartburn, nausea and vomiting.  Genitourinary:  Negative for dysuria, frequency, hematuria and urgency.  Musculoskeletal:  Negative for back pain, falls and myalgias.  Skin:  Negative for rash.  Neurological:  Negative for dizziness, sensory change, loss of  consciousness, weakness and headaches.  Endo/Heme/Allergies:  Negative for environmental allergies. Does not bruise/bleed easily.  Psychiatric/Behavioral:  Negative for depression and suicidal ideas. The patient is not nervous/anxious and does not have insomnia.       Objective:     BP (!) 152/98 (BP Location: Left Arm, Patient Position: Sitting, Cuff Size: Large)   Pulse 95   Temp 98.2 F (36.8 C) (Oral)   Resp (!) 22   Ht 5' 3 (1.6 m)   Wt 191 lb 6.4 oz (86.8 kg)   SpO2 100%   BMI 33.90 kg/m  BP Readings from Last 3 Encounters:  09/13/23 (!) 152/98  09/12/23 (!) 146/82  09/09/23 132/78   Wt Readings from Last 3 Encounters:  09/13/23 191 lb 6.4 oz (86.8 kg)  09/12/23 190 lb 12.8 oz (86.5 kg)  09/09/23 192 lb (87.1 kg)   SpO2 Readings from Last 3 Encounters:  09/13/23 100%  09/12/23 100%   09/09/23 99%      Physical Exam Vitals and nursing note reviewed.  Constitutional:      General: She is not in acute distress.    Appearance: Normal appearance. She is well-developed.  HENT:     Head: Normocephalic and atraumatic.  Eyes:     General: No scleral icterus.       Right eye: No discharge.        Left eye: No discharge.  Cardiovascular:     Rate and Rhythm: Normal rate and regular rhythm.     Heart sounds: No murmur heard. Pulmonary:     Effort: Pulmonary effort is normal. No respiratory distress.     Breath sounds: Normal breath sounds.  Musculoskeletal:        General: Normal range of motion.     Cervical back: Normal range of motion and neck supple.     Right lower leg: No edema.     Left lower leg: No edema.  Skin:    General: Skin is warm and dry.  Neurological:     Mental Status: She is alert and oriented to person, place, and time.  Psychiatric:        Mood and Affect: Mood normal.        Behavior: Behavior normal.        Thought Content: Thought content normal.        Judgment: Judgment normal.      No results found for any visits on 09/13/23.  Last CBC Lab Results  Component Value Date   WBC 12.2 (H) 06/01/2023   HGB 9.5 (L) 06/01/2023   HCT 30.7 (L) 06/01/2023   MCV 106.6 (H) 06/01/2023   MCH 33.0 06/01/2023   RDW 14.7 06/01/2023   PLT 276 06/01/2023   Last metabolic panel Lab Results  Component Value Date   GLUCOSE 126 (H) 06/01/2023   NA 136 06/01/2023   K 4.0 06/01/2023   CL 90 (L) 06/01/2023   CO2 36 (H) 06/01/2023   BUN 21 06/01/2023   CREATININE 2.81 (H) 06/01/2023   EGFR 15.0 07/27/2023   CALCIUM  8.2 (L) 06/01/2023   PHOS 2.7 06/01/2023   PROT 6.3 (L) 06/01/2023   ALBUMIN 2.6 (L) 06/01/2023   BILITOT 0.4 06/01/2023   ALKPHOS 118 06/01/2023   AST 26 06/01/2023   ALT 22 06/01/2023   ANIONGAP 10 06/01/2023   Last lipids Lab Results  Component Value Date   CHOL 205 (H) 08/03/2022   HDL 52.00 08/03/2022   LDLCALC  116 (H) 02/26/2020  LDLDIRECT 109.0 08/03/2022   TRIG 268.0 (H) 08/03/2022   CHOLHDL 4 08/03/2022   Last hemoglobin A1c Lab Results  Component Value Date   HGBA1C 5.6 08/23/2020   Last thyroid  functions Lab Results  Component Value Date   TSH 3.75 12/08/2021      The 10-year ASCVD risk score (Arnett DK, et al., 2019) is: 28.6%    Assessment & Plan:   Problem List Items Addressed This Visit       Unprioritized   ANEMIA-IRON  DEFICIENCY   Relevant Orders   CBC with Differential/Platelet   Iron , TIBC and Ferritin Panel   Stage 4 chronic kidney disease (HCC)   Relevant Orders   CBC with Differential/Platelet   Comprehensive metabolic panel   Snoring   Refer to pulm for sleep study      Relevant Orders   Ambulatory referral to Pulmonology   Idiopathic progressive neuropathy   Relevant Orders   Vitamin B12   Hyperlipidemia   Encourage heart healthy diet such as MIND or DASH diet, increase exercise, avoid trans fats, simple carbohydrates and processed foods, consider a krill or fish or flaxseed oil cap daily.        Dyspnea - Primary   Worsening--- Pulse ox with exertion down to 93% but comes back up to 100 % with rest  Check cxr Check echo       Relevant Orders   CBC with Differential/Platelet   Comprehensive metabolic panel   Lipid panel   TSH   DG Chest 2 View  Assessment and Plan    Dyspnea on Exertion   She reports shortness of breath with exertion, experiencing a drop in oxygen saturation to 93% and an increase in heart rate to 126 bpm without ankle edema. The differential diagnosis includes COPD, heart failure, and deconditioning. We discussed the potential benefits of oxygen therapy to improve her quality of life and prevent complications, but she prefers to avoid it unless necessary. We will order a chest x-ray, echocardiogram, and blood work, and refer her to pulmonology for further evaluation and a potential sleep study.  Peripheral Neuropathy    She describes worsening neuropathy in her feet as feeling like walking on rocks, with occasional inability to feel her feet, which affects her driving. Gabapentin  has not provided relief, and Lyrica is contraindicated due to kidney issues. After discussing the risks and benefits of current medication versus alternatives, she prefers to continue Norvig. We will check B12 levels, continue Norvig, and consider alternative treatments based on lab results.  Chronic Kidney Disease   She is concerned about medication interactions while undergoing treatment to maintain kidney function. We discussed the importance of avoiding contraindicated medications and the need for regular monitoring. We will monitor kidney function regularly and avoid contraindicated medications.  General Health Maintenance   We will ensure regular follow-up with nephrology and encourage adherence to prescribed treatments and lifestyle modifications.  Follow-up   We will follow up after diagnostic tests and specialist referrals. A follow-up appointment will be scheduled to review test results and adjust the treatment plan as necessary.        No follow-ups on file.    Kamani Magnussen R Lowne Chase, DO

## 2023-09-13 NOTE — Assessment & Plan Note (Signed)
 Worsening--- Pulse ox with exertion down to 93% but comes back up to 100 % with rest  Check cxr Check echo

## 2023-09-13 NOTE — Assessment & Plan Note (Signed)
Refer to pulm for sleep study 

## 2023-09-13 NOTE — Telephone Encounter (Signed)
 Auth Submission: NO AUTH NEEDED Site of care: Site of care: [facility] Payer: [employer] MEDICARE Medication & CPT/J Code(s) submitted: OCTREOTIDE  - [id number] Route of submission (phone, fax, portal): PORTAL Phone # Fax # Auth type: Buy/Bill PB Units/visits requested: Q28DAYS Reference number: [id number] Approval from: 09/13/23 to 09/05/24

## 2023-09-13 NOTE — Assessment & Plan Note (Signed)
 Encourage heart healthy diet such as MIND or DASH diet, increase exercise, avoid trans fats, simple carbohydrates and processed foods, consider a krill or fish or flaxseed oil cap daily.

## 2023-09-13 NOTE — Telephone Encounter (Signed)
 CRITICAL VALUE STICKER  CRITICAL VALUE:  gfr  12.50  RECEIVER (on-site recipient of call):  Yaritzy Huser, CMA  DATE & TIME NOTIFIED: 09/13/23 @ 2:32  MESSENGER (representative from lab): Kristine  MD NOTIFIED:  [name], DO  TIME OF NOTIFICATION: 2:34pm  RESPONSE:

## 2023-09-14 ENCOUNTER — Ambulatory Visit: Payer: Medicare Other

## 2023-09-14 VITALS — BP 146/77 | HR 93 | Temp 98.2°F | Resp 20 | Ht 63.0 in | Wt 190.6 lb

## 2023-09-14 DIAGNOSIS — E860 Dehydration: Secondary | ICD-10-CM

## 2023-09-14 LAB — IRON,TIBC AND FERRITIN PANEL
%SAT: 27 % (ref 16–45)
Ferritin: 82 ng/mL (ref 16–288)
Iron: 95 ug/dL (ref 45–160)
TIBC: 354 ug/dL (ref 250–450)

## 2023-09-14 MED ORDER — HEPARIN SOD (PORK) LOCK FLUSH 100 UNIT/ML IV SOLN
500.0000 [IU] | Freq: Once | INTRAVENOUS | Status: AC | PRN
  Administered 2023-09-14: 500 [IU]
  Filled 2023-09-14: qty 5

## 2023-09-14 MED ORDER — SODIUM CHLORIDE 0.9 % IV BOLUS
1000.0000 mL | Freq: Once | INTRAVENOUS | Status: AC
  Administered 2023-09-14: 1000 mL via INTRAVENOUS
  Filled 2023-09-14: qty 1000

## 2023-09-14 NOTE — Telephone Encounter (Signed)
 Labs faxed

## 2023-09-14 NOTE — Progress Notes (Signed)
 Diagnosis: Dehydration  Provider:  Praveen Mannam MD  Procedure: IV Infusion  IV Type: Port a Cath, IV Location: R Chest  Normal Saline, Dose: 1000 ml  Infusion Start Time: 0828  Infusion Stop Time: 0934  Post Infusion IV Care: Port a Cath Deaccessed/Flushed  Discharge: Condition: Good, Destination: Home . AVS Declined  Performed by:  Aariyana Manz, RN

## 2023-09-16 ENCOUNTER — Ambulatory Visit (INDEPENDENT_AMBULATORY_CARE_PROVIDER_SITE_OTHER): Payer: Medicare Other

## 2023-09-16 VITALS — BP 154/91 | HR 95 | Temp 97.8°F | Resp 20 | Ht 63.0 in | Wt 191.2 lb

## 2023-09-16 DIAGNOSIS — E860 Dehydration: Secondary | ICD-10-CM | POA: Diagnosis not present

## 2023-09-16 MED ORDER — SODIUM CHLORIDE 0.9 % IV BOLUS
1000.0000 mL | Freq: Once | INTRAVENOUS | Status: AC
  Administered 2023-09-16: 1000 mL via INTRAVENOUS
  Filled 2023-09-16: qty 1000

## 2023-09-16 MED ORDER — HEPARIN SOD (PORK) LOCK FLUSH 100 UNIT/ML IV SOLN
500.0000 [IU] | Freq: Once | INTRAVENOUS | Status: AC | PRN
Start: 2023-09-16 — End: 2023-09-16
  Administered 2023-09-16: 500 [IU]
  Filled 2023-09-16: qty 5

## 2023-09-16 NOTE — Progress Notes (Signed)
 Diagnosis: Dehydration  Provider:  Mannam, Praveen MD  Procedure: IV Infusion  IV Type: Port a Cath, IV Location: R Chest  Normal Saline, Dose: 1000 ml  Infusion Start Time: 0827  Infusion Stop Time: 0933  Post Infusion IV Care: Patient declined observation  Discharge: Condition: Good, Destination: Home . AVS Declined  Performed by:  Rocky FORBES Sar, RN

## 2023-09-19 ENCOUNTER — Ambulatory Visit: Payer: Medicare Other

## 2023-09-19 VITALS — BP 159/93 | HR 100 | Temp 97.7°F | Resp 18 | Ht 63.0 in | Wt 190.6 lb

## 2023-09-19 DIAGNOSIS — E860 Dehydration: Secondary | ICD-10-CM

## 2023-09-19 MED ORDER — SODIUM CHLORIDE 0.9 % IV BOLUS
1000.0000 mL | Freq: Once | INTRAVENOUS | Status: AC
  Administered 2023-09-19: 1000 mL via INTRAVENOUS
  Filled 2023-09-19: qty 1000

## 2023-09-19 MED ORDER — HEPARIN SOD (PORK) LOCK FLUSH 100 UNIT/ML IV SOLN
500.0000 [IU] | Freq: Once | INTRAVENOUS | Status: AC | PRN
Start: 2023-09-19 — End: 2023-09-19
  Administered 2023-09-19: 500 [IU]
  Filled 2023-09-19: qty 5

## 2023-09-19 NOTE — Progress Notes (Signed)
 Diagnosis: Dehydration  Provider:  Praveen Mannam MD  Procedure: IV Infusion  IV Type: Port a Cath, IV Location: R Chest  Normal Saline, Dose: 1000 ml  Infusion Start Time: 0827  Infusion Stop Time: 0931  Post Infusion IV Care: Port a Cath Deaccessed/Flushed heparin  locked.  Discharge: Condition: Good, Destination: Home . AVS Declined  Performed by:  Rudine Rieger, RN

## 2023-09-21 ENCOUNTER — Telehealth: Payer: Self-pay | Admitting: Family Medicine

## 2023-09-21 ENCOUNTER — Ambulatory Visit: Payer: Medicare Other

## 2023-09-21 VITALS — BP 137/83 | HR 94 | Temp 97.8°F | Resp 18 | Ht 63.0 in | Wt 189.4 lb

## 2023-09-21 DIAGNOSIS — E860 Dehydration: Secondary | ICD-10-CM | POA: Diagnosis not present

## 2023-09-21 MED ORDER — SODIUM CHLORIDE 0.9 % IV BOLUS
1000.0000 mL | Freq: Once | INTRAVENOUS | Status: AC
  Administered 2023-09-21: 1000 mL via INTRAVENOUS
  Filled 2023-09-21: qty 1000

## 2023-09-21 MED ORDER — HEPARIN SOD (PORK) LOCK FLUSH 100 UNIT/ML IV SOLN
500.0000 [IU] | Freq: Once | INTRAVENOUS | Status: AC | PRN
Start: 2023-09-21 — End: 2023-09-21
  Administered 2023-09-21: 500 [IU]
  Filled 2023-09-21: qty 5

## 2023-09-21 NOTE — Telephone Encounter (Signed)
 Patient daughter is calling in to get her mother scheduled for a follow up appt from being discharged from the hosptial

## 2023-09-21 NOTE — Progress Notes (Signed)
 Diagnosis: Dehydration  Provider:  Praveen Mannam MD  Procedure: IV Infusion  IV Type: Port a Cath, IV Location: R Chest  Normal Saline, Dose: 1000 ml  Infusion Start Time: 0821  Infusion Stop Time: 0929  Post Infusion IV Care: Port a Cath Deaccessed/Flushed  Discharge: Condition: Good, Destination: Home . AVS Declined  Performed by:  Shirly Dow, RN

## 2023-09-22 DIAGNOSIS — D509 Iron deficiency anemia, unspecified: Secondary | ICD-10-CM | POA: Diagnosis not present

## 2023-09-22 DIAGNOSIS — E871 Hypo-osmolality and hyponatremia: Secondary | ICD-10-CM | POA: Diagnosis not present

## 2023-09-22 DIAGNOSIS — M109 Gout, unspecified: Secondary | ICD-10-CM | POA: Diagnosis not present

## 2023-09-22 DIAGNOSIS — R768 Other specified abnormal immunological findings in serum: Secondary | ICD-10-CM | POA: Diagnosis not present

## 2023-09-22 DIAGNOSIS — E876 Hypokalemia: Secondary | ICD-10-CM | POA: Diagnosis not present

## 2023-09-22 DIAGNOSIS — D631 Anemia in chronic kidney disease: Secondary | ICD-10-CM | POA: Diagnosis not present

## 2023-09-22 DIAGNOSIS — N184 Chronic kidney disease, stage 4 (severe): Secondary | ICD-10-CM | POA: Diagnosis not present

## 2023-09-22 DIAGNOSIS — N2581 Secondary hyperparathyroidism of renal origin: Secondary | ICD-10-CM | POA: Diagnosis not present

## 2023-09-22 DIAGNOSIS — N179 Acute kidney failure, unspecified: Secondary | ICD-10-CM | POA: Diagnosis not present

## 2023-09-22 DIAGNOSIS — N189 Chronic kidney disease, unspecified: Secondary | ICD-10-CM | POA: Diagnosis not present

## 2023-09-22 DIAGNOSIS — I129 Hypertensive chronic kidney disease with stage 1 through stage 4 chronic kidney disease, or unspecified chronic kidney disease: Secondary | ICD-10-CM | POA: Diagnosis not present

## 2023-09-22 NOTE — Telephone Encounter (Signed)
Called pt  and LVM to schedule pt a HFU with PCP.

## 2023-09-23 ENCOUNTER — Ambulatory Visit: Payer: Medicare Other

## 2023-09-23 VITALS — BP 157/90 | HR 92 | Temp 98.0°F | Resp 20 | Ht 63.0 in | Wt 191.4 lb

## 2023-09-23 DIAGNOSIS — E860 Dehydration: Secondary | ICD-10-CM

## 2023-09-23 MED ORDER — SODIUM CHLORIDE 0.9 % IV BOLUS
1000.0000 mL | Freq: Once | INTRAVENOUS | Status: AC
  Administered 2023-09-23: 1000 mL via INTRAVENOUS
  Filled 2023-09-23: qty 1000

## 2023-09-23 MED ORDER — HEPARIN SOD (PORK) LOCK FLUSH 100 UNIT/ML IV SOLN
500.0000 [IU] | Freq: Once | INTRAVENOUS | Status: AC | PRN
  Administered 2023-09-23: 500 [IU]
  Filled 2023-09-23: qty 5

## 2023-09-23 NOTE — Progress Notes (Signed)
Diagnosis: Dehydration  Provider:  Chilton Greathouse MD  Procedure: IV Infusion  IV Type: Port a Cath, IV Location: R Chest  Normal Saline, Dose: 1000 ml  Infusion Start Time: 0918  Infusion Stop Time: 1026  Post Infusion IV Care: Port a Cath Deaccessed/Flushed  Discharge: Condition: Good, Destination: Home . AVS Declined  Performed by:  Nat Math, RN

## 2023-09-26 ENCOUNTER — Encounter: Payer: Self-pay | Admitting: Nephrology

## 2023-09-26 ENCOUNTER — Encounter: Payer: Self-pay | Admitting: Gastroenterology

## 2023-09-26 ENCOUNTER — Encounter: Payer: Self-pay | Admitting: Family

## 2023-09-26 ENCOUNTER — Ambulatory Visit: Payer: Medicare Other

## 2023-09-26 VITALS — BP 138/75 | HR 89 | Temp 97.9°F | Resp 18 | Ht 63.0 in | Wt 188.4 lb

## 2023-09-26 DIAGNOSIS — E860 Dehydration: Secondary | ICD-10-CM | POA: Diagnosis not present

## 2023-09-26 LAB — LAB REPORT - SCANNED
Creatinine, POC: 177.6 mg/dL
EGFR: 14

## 2023-09-26 MED ORDER — HEPARIN SOD (PORK) LOCK FLUSH 100 UNIT/ML IV SOLN
500.0000 [IU] | Freq: Once | INTRAVENOUS | Status: AC | PRN
  Administered 2023-09-26: 500 [IU]
  Filled 2023-09-26: qty 5

## 2023-09-26 MED ORDER — SODIUM CHLORIDE 0.9 % IV BOLUS
1000.0000 mL | Freq: Once | INTRAVENOUS | Status: AC
  Administered 2023-09-26: 1000 mL via INTRAVENOUS
  Filled 2023-09-26: qty 1000

## 2023-09-26 NOTE — Progress Notes (Signed)
Diagnosis: Dehydration  Provider:  Chilton Greathouse MD  Procedure: IV Infusion  IV Type: Port a Cath, IV Location: R Chest  Normal Saline, Dose: 1000 ml  Infusion Start Time: 0820  Infusion Stop Time: 0928  Post Infusion IV Care: Patient declined observation and Port a Cath Deaccessed/Flushed and heparin locked  Discharge: Condition: Stable, Destination: Home . AVS Declined  Performed by:  Wyvonne Lenz, RN

## 2023-09-28 ENCOUNTER — Telehealth: Payer: Self-pay

## 2023-09-28 ENCOUNTER — Ambulatory Visit: Payer: Medicare Other

## 2023-09-28 VITALS — BP 166/101 | HR 104 | Temp 98.1°F | Resp 18 | Ht 63.0 in | Wt 188.0 lb

## 2023-09-28 DIAGNOSIS — E860 Dehydration: Secondary | ICD-10-CM | POA: Diagnosis not present

## 2023-09-28 MED ORDER — HEPARIN SOD (PORK) LOCK FLUSH 100 UNIT/ML IV SOLN
500.0000 [IU] | Freq: Once | INTRAVENOUS | Status: AC | PRN
  Administered 2023-09-28: 500 [IU]
  Filled 2023-09-28: qty 5

## 2023-09-28 MED ORDER — HEPARIN SOD (PORK) LOCK FLUSH 100 UNIT/ML IV SOLN: 500.0000 [IU] | Freq: Once | INTRAVENOUS | Status: DC | PRN

## 2023-09-28 MED ORDER — SODIUM CHLORIDE 0.9 % IV BOLUS
1000.0000 mL | Freq: Once | INTRAVENOUS | Status: DC
  Filled 2023-09-28: qty 1000

## 2023-09-28 MED ORDER — SODIUM CHLORIDE 0.9 % IV BOLUS
1000.0000 mL | Freq: Once | INTRAVENOUS | Status: AC
  Administered 2023-09-28: 1000 mL via INTRAVENOUS
  Filled 2023-09-28: qty 1000

## 2023-09-28 NOTE — Telephone Encounter (Signed)
-----   Message from Benancio Deeds sent at 09/28/2023 11:47 AM EST ----- Regarding: follow up Jan can you help coordinate a follow-up for this patient with me. Thanks

## 2023-09-28 NOTE — Progress Notes (Signed)
Diagnosis: Dehydration  Provider:  Chilton Greathouse MD  Procedure: IV Infusion  IV Type: Port a Cath, IV Location: R Chest  Normal Saline, Dose: 1000 ml  Infusion Start Time: 1446  Infusion Stop Time: 1554  Post Infusion IV Care: Port a Cath Deaccessed/Flushed and heparin locked.  Discharge: Condition: Good, Destination: Home . AVS Declined  Performed by:  Garnette Czech, RN

## 2023-09-28 NOTE — Telephone Encounter (Signed)
Patient scheduled for next available appointment, 4-8.  MyChart message to patient with appointment information

## 2023-09-30 ENCOUNTER — Ambulatory Visit: Payer: Medicare Other

## 2023-09-30 VITALS — BP 113/73 | HR 102 | Temp 97.7°F | Resp 22 | Ht 63.0 in | Wt 189.2 lb

## 2023-09-30 DIAGNOSIS — E860 Dehydration: Secondary | ICD-10-CM

## 2023-09-30 MED ORDER — HEPARIN SOD (PORK) LOCK FLUSH 100 UNIT/ML IV SOLN
500.0000 [IU] | Freq: Once | INTRAVENOUS | Status: AC | PRN
  Administered 2023-09-30: 500 [IU]
  Filled 2023-09-30: qty 5

## 2023-09-30 MED ORDER — SODIUM CHLORIDE 0.9 % IV BOLUS
1000.0000 mL | Freq: Once | INTRAVENOUS | Status: AC
  Administered 2023-09-30: 1000 mL via INTRAVENOUS
  Filled 2023-09-30: qty 1000

## 2023-09-30 NOTE — Progress Notes (Signed)
Diagnosis: Dehydration  Provider:  Chilton Greathouse MD  Procedure: IV Infusion  IV Type: Port a Cath, IV Location: R Chest  Normal Saline, Dose: 1000 ml  Infusion Start Time: 0852  Infusion Stop Time: 0959  Post Infusion IV Care: Patient declined observation and Port a Cath Deaccessed/Flushed and heparin locked  Discharge: Condition: Good, Destination: Home . AVS Declined  Performed by:  Wyvonne Lenz, RN

## 2023-10-03 ENCOUNTER — Ambulatory Visit: Payer: Medicare Other

## 2023-10-03 VITALS — BP 134/90 | HR 100 | Temp 97.4°F | Resp 20 | Ht 63.0 in | Wt 188.4 lb

## 2023-10-03 DIAGNOSIS — E860 Dehydration: Secondary | ICD-10-CM

## 2023-10-03 MED ORDER — SODIUM CHLORIDE 0.9 % IV BOLUS
1000.0000 mL | Freq: Once | INTRAVENOUS | Status: AC
  Administered 2023-10-03: 1000 mL via INTRAVENOUS
  Filled 2023-10-03: qty 1000

## 2023-10-03 MED ORDER — HEPARIN SOD (PORK) LOCK FLUSH 100 UNIT/ML IV SOLN
500.0000 [IU] | Freq: Once | INTRAVENOUS | Status: AC | PRN
  Administered 2023-10-03: 500 [IU]

## 2023-10-03 NOTE — Progress Notes (Signed)
Diagnosis: Dehydration  Provider:  Chilton Greathouse MD  Procedure: IV Infusion  IV Type: Port a Cath, IV Location: R Chest  Normal Saline, Dose: 1000 ml  Infusion Start Time: 0825  Infusion Stop Time: 0936  Post Infusion IV Care: Port a Cath Deaccessed/Flushedand heparin locked  Discharge: Condition: Good, Destination: Home . AVS Declined  Performed by:  Garnette Czech, RN

## 2023-10-05 ENCOUNTER — Ambulatory Visit: Payer: Medicare Other

## 2023-10-05 VITALS — BP 149/88 | HR 83 | Temp 97.9°F | Resp 20 | Ht 63.0 in | Wt 188.8 lb

## 2023-10-05 DIAGNOSIS — E860 Dehydration: Secondary | ICD-10-CM | POA: Diagnosis not present

## 2023-10-05 MED ORDER — SODIUM CHLORIDE 0.9 % IV BOLUS
1000.0000 mL | Freq: Once | INTRAVENOUS | Status: AC
  Administered 2023-10-05: 1000 mL via INTRAVENOUS
  Filled 2023-10-05: qty 1000

## 2023-10-05 MED ORDER — HEPARIN SOD (PORK) LOCK FLUSH 100 UNIT/ML IV SOLN
500.0000 [IU] | Freq: Once | INTRAVENOUS | Status: AC | PRN
  Administered 2023-10-05: 500 [IU]
  Filled 2023-10-05: qty 5

## 2023-10-05 NOTE — Progress Notes (Signed)
Diagnosis: Dehydration  Provider:  Chilton Greathouse MD  Procedure: IV Infusion  IV Type: Port a Cath, IV Location: R Chest  Normal Saline, Dose: 1000 ml  Infusion Start Time: 0818  Infusion Stop Time: 0926  Post Infusion IV Care: Patient declined observation and Port a Cath Deaccessed/Flushed and heparin locked  Discharge: Condition: Good, Destination: Home . AVS Declined  Performed by:  Wyvonne Lenz, RN

## 2023-10-07 ENCOUNTER — Ambulatory Visit (INDEPENDENT_AMBULATORY_CARE_PROVIDER_SITE_OTHER): Payer: Medicare Other

## 2023-10-07 VITALS — BP 127/79 | HR 103 | Temp 97.5°F | Resp 20 | Ht 63.0 in | Wt 191.2 lb

## 2023-10-07 DIAGNOSIS — K50819 Crohn's disease of both small and large intestine with unspecified complications: Secondary | ICD-10-CM

## 2023-10-07 DIAGNOSIS — R198 Other specified symptoms and signs involving the digestive system and abdomen: Secondary | ICD-10-CM

## 2023-10-07 DIAGNOSIS — Z932 Ileostomy status: Secondary | ICD-10-CM

## 2023-10-07 DIAGNOSIS — E860 Dehydration: Secondary | ICD-10-CM

## 2023-10-07 MED ORDER — SODIUM CHLORIDE 0.9 % IV BOLUS
1000.0000 mL | Freq: Once | INTRAVENOUS | Status: AC
  Administered 2023-10-07: 1000 mL via INTRAVENOUS
  Filled 2023-10-07: qty 1000

## 2023-10-07 MED ORDER — HEPARIN SOD (PORK) LOCK FLUSH 100 UNIT/ML IV SOLN
500.0000 [IU] | Freq: Once | INTRAVENOUS | Status: AC | PRN
  Administered 2023-10-07: 500 [IU]
  Filled 2023-10-07: qty 5

## 2023-10-07 MED ORDER — OCTREOTIDE ACETATE 20 MG IM KIT
20.0000 mg | PACK | Freq: Once | INTRAMUSCULAR | Status: AC
  Administered 2023-10-07: 20 mg via INTRAMUSCULAR

## 2023-10-07 NOTE — Progress Notes (Signed)
Diagnosis: Dehydration  Provider:  Chilton Greathouse MD  Procedure: IV Infusion  IV Type: Port a Cath, IV Location: R Chest  Normal Saline, Dose: 1000 ml  Infusion Start Time: 0825  Infusion Stop Time: 0934  Post Infusion IV Care: Patient declined observation and Port a Cath Deaccessed/Flushed and heparin locked  Discharge: Condition: Stable, Destination: Home . AVS Declined  Performed by:  Wyvonne Lenz, RN    .Diagnosis: Crohn's Disease  Provider:  Chilton Greathouse MD  Procedure: Injection  Sandostatin LAR (octreotide), Dose: 20 mg, Site: intramuscular, Number of injections: 1  Injection Site(s): Left upper quad. gluteus  Post Care: Patient declined observation  Discharge: Condition: Stable, Destination: Home . AVS Declined  Performed by:  Wyvonne Lenz, RN

## 2023-10-10 ENCOUNTER — Ambulatory Visit: Payer: Medicare Other

## 2023-10-10 VITALS — BP 164/94 | HR 90 | Temp 98.2°F | Resp 20 | Ht 63.0 in | Wt 189.4 lb

## 2023-10-10 DIAGNOSIS — E860 Dehydration: Secondary | ICD-10-CM

## 2023-10-10 MED ORDER — SODIUM CHLORIDE 0.9 % IV BOLUS
1000.0000 mL | Freq: Once | INTRAVENOUS | Status: AC
  Administered 2023-10-10: 1000 mL via INTRAVENOUS
  Filled 2023-10-10: qty 1000

## 2023-10-10 MED ORDER — HEPARIN SOD (PORK) LOCK FLUSH 100 UNIT/ML IV SOLN
500.0000 [IU] | Freq: Once | INTRAVENOUS | Status: AC | PRN
  Administered 2023-10-10: 500 [IU]
  Filled 2023-10-10: qty 5

## 2023-10-10 NOTE — Progress Notes (Signed)
Diagnosis: Dehydration  Provider:  Chilton Greathouse MD  Procedure: IV Infusion  IV Type: Port a Cath, IV Location: R Chest  Normal Saline, Dose: 1000 ml  Infusion Start Time: 0816  Infusion Stop Time: 0922  Post Infusion IV Care: Patient declined observation and Port a Cath Deaccessed/Flushed and heparin locked  Discharge: Condition: Good, Destination: Home . AVS Provided  Performed by:  Wyvonne Lenz, RN

## 2023-10-12 ENCOUNTER — Ambulatory Visit: Payer: Medicare Other

## 2023-10-12 VITALS — BP 159/90 | HR 94 | Temp 97.3°F | Resp 18 | Ht 63.0 in | Wt 186.4 lb

## 2023-10-12 DIAGNOSIS — E860 Dehydration: Secondary | ICD-10-CM

## 2023-10-12 MED ORDER — SODIUM CHLORIDE 0.9 % IV BOLUS
1000.0000 mL | Freq: Once | INTRAVENOUS | Status: AC
  Administered 2023-10-12: 1000 mL via INTRAVENOUS
  Filled 2023-10-12: qty 1000

## 2023-10-12 MED ORDER — HEPARIN SOD (PORK) LOCK FLUSH 100 UNIT/ML IV SOLN
500.0000 [IU] | Freq: Once | INTRAVENOUS | Status: AC | PRN
  Administered 2023-10-12: 500 [IU]
  Filled 2023-10-12: qty 5

## 2023-10-12 NOTE — Progress Notes (Signed)
 Diagnosis: Dehydration  Provider:  Mannam, Praveen MD  Procedure: IV Infusion  IV Type: Port a Cath, IV Location: R Chest  Normal Saline, Dose: 1000 ml  Infusion Start Time: 0831  Infusion Stop Time: 1039  Post Infusion IV Care: Peripheral IV Discontinued  Discharge: Condition: Good, Destination: Home . AVS Declined  Performed by:  Rocky FORBES Sar, RN

## 2023-10-13 NOTE — Telephone Encounter (Signed)
 Called and spoke to patient to confirm her appointment on 2-18.  She has it on her calendar

## 2023-10-14 ENCOUNTER — Ambulatory Visit (INDEPENDENT_AMBULATORY_CARE_PROVIDER_SITE_OTHER): Payer: Medicare Other

## 2023-10-14 VITALS — BP 127/74 | HR 89 | Temp 98.0°F | Resp 20 | Ht 63.0 in | Wt 188.4 lb

## 2023-10-14 DIAGNOSIS — E860 Dehydration: Secondary | ICD-10-CM

## 2023-10-14 MED ORDER — SODIUM CHLORIDE 0.9 % IV BOLUS
1000.0000 mL | Freq: Once | INTRAVENOUS | Status: AC
  Administered 2023-10-14: 1000 mL via INTRAVENOUS
  Filled 2023-10-14: qty 1000

## 2023-10-14 MED ORDER — HEPARIN SOD (PORK) LOCK FLUSH 100 UNIT/ML IV SOLN
500.0000 [IU] | Freq: Once | INTRAVENOUS | Status: AC | PRN
  Administered 2023-10-14: 500 [IU]
  Filled 2023-10-14: qty 5

## 2023-10-14 NOTE — Progress Notes (Signed)
 Diagnosis: Dehydration  Provider:  Praveen Mannam MD  Procedure: IV Infusion  IV Type: Port a Cath, IV Location: R Chest  Normal Saline, Dose: 1000 ml  Infusion Start Time: 0827  Infusion Stop Time: 0936  Post Infusion IV Care: Port a Cath Deaccessed/Flushed,Heparin  locked  Discharge: Condition: Good, Destination: Home . AVS Declined  Performed by:  Yvaine Jankowiak, RN

## 2023-10-17 ENCOUNTER — Ambulatory Visit: Payer: Medicare Other

## 2023-10-17 VITALS — BP 127/80 | HR 98 | Temp 97.9°F | Resp 20 | Ht 63.0 in | Wt 188.0 lb

## 2023-10-17 DIAGNOSIS — E860 Dehydration: Secondary | ICD-10-CM | POA: Diagnosis not present

## 2023-10-17 MED ORDER — HEPARIN SOD (PORK) LOCK FLUSH 100 UNIT/ML IV SOLN
500.0000 [IU] | Freq: Once | INTRAVENOUS | Status: AC | PRN
  Administered 2023-10-17: 500 [IU]
  Filled 2023-10-17: qty 5

## 2023-10-17 MED ORDER — SODIUM CHLORIDE 0.9 % IV BOLUS
1000.0000 mL | Freq: Once | INTRAVENOUS | Status: AC
  Administered 2023-10-17: 1000 mL via INTRAVENOUS
  Filled 2023-10-17: qty 1000

## 2023-10-17 NOTE — Progress Notes (Signed)
 Diagnosis: Dehydration  Provider:  Praveen Mannam MD  Procedure: IV Infusion  IV Type: Port a Cath, IV Location: R Chest  Normal Saline, Dose: 1000 ml  Infusion Start Time: 0830  Infusion Stop Time: 0942  Post Infusion IV Care: Port a Cath Deaccessed/Flushed heparin  flushed   Discharge: Condition: Good, Destination: Home . AVS Declined  Performed by:  Lakashia Collison, RN

## 2023-10-19 ENCOUNTER — Ambulatory Visit: Payer: Medicare Other

## 2023-10-19 VITALS — BP 154/90 | HR 91 | Temp 97.6°F | Resp 20 | Ht 63.0 in | Wt 190.0 lb

## 2023-10-19 DIAGNOSIS — E860 Dehydration: Secondary | ICD-10-CM | POA: Diagnosis not present

## 2023-10-19 MED ORDER — SODIUM CHLORIDE 0.9 % IV BOLUS
1000.0000 mL | Freq: Once | INTRAVENOUS | Status: AC
  Administered 2023-10-19: 1000 mL via INTRAVENOUS
  Filled 2023-10-19: qty 1000

## 2023-10-19 MED ORDER — HEPARIN SOD (PORK) LOCK FLUSH 100 UNIT/ML IV SOLN
500.0000 [IU] | Freq: Once | INTRAVENOUS | Status: AC | PRN
  Administered 2023-10-19: 500 [IU]
  Filled 2023-10-19: qty 5

## 2023-10-19 NOTE — Progress Notes (Signed)
Diagnosis: Dehydration  Provider:  Chilton Greathouse MD  Procedure: IV Infusion  IV Type: Port a Cath, IV Location: R Chest  Normal Saline, Dose: 1000 ml  Infusion Start Time: 0819  Infusion Stop Time: 0929  Post Infusion IV Care: PICC Line Flushed/Capped,Heparin locked.  Discharge: Condition: Good, Destination: Home . AVS Declined  Performed by:  Garnette Czech, RN

## 2023-10-21 ENCOUNTER — Ambulatory Visit: Payer: Medicare Other

## 2023-10-21 VITALS — BP 137/68 | HR 97 | Temp 97.8°F | Resp 20 | Ht 60.0 in | Wt 188.2 lb

## 2023-10-21 DIAGNOSIS — E860 Dehydration: Secondary | ICD-10-CM

## 2023-10-21 MED ORDER — HEPARIN SOD (PORK) LOCK FLUSH 100 UNIT/ML IV SOLN
500.0000 [IU] | Freq: Once | INTRAVENOUS | Status: AC | PRN
  Administered 2023-10-21: 500 [IU]

## 2023-10-21 MED ORDER — SODIUM CHLORIDE 0.9 % IV BOLUS
1000.0000 mL | Freq: Once | INTRAVENOUS | Status: AC
  Administered 2023-10-21: 1000 mL via INTRAVENOUS
  Filled 2023-10-21: qty 1000

## 2023-10-21 NOTE — Progress Notes (Signed)
Diagnosis: Dehydration  Provider:  Chilton Greathouse MD  Procedure: IV Infusion  IV Type: Port a Cath, IV Location: R Chest  Normal Saline, Dose: 1000 ml  Infusion Start Time: 0823  Infusion Stop Time: 0929  Post Infusion IV Care: Port a Cath Deaccessed/Flushed,heparin locked.  Discharge: Condition: Good, Destination: Home . AVS Declined  Performed by:  Garnette Czech, RN

## 2023-10-24 ENCOUNTER — Ambulatory Visit: Payer: Medicare Other

## 2023-10-24 VITALS — BP 155/95 | HR 75 | Temp 97.8°F | Resp 18 | Ht 63.0 in | Wt 187.0 lb

## 2023-10-24 DIAGNOSIS — E860 Dehydration: Secondary | ICD-10-CM | POA: Diagnosis not present

## 2023-10-24 MED ORDER — SODIUM CHLORIDE 0.9 % IV BOLUS
1000.0000 mL | Freq: Once | INTRAVENOUS | Status: AC
  Administered 2023-10-24: 1000 mL via INTRAVENOUS
  Filled 2023-10-24: qty 1000

## 2023-10-24 MED ORDER — HEPARIN SOD (PORK) LOCK FLUSH 100 UNIT/ML IV SOLN
500.0000 [IU] | Freq: Once | INTRAVENOUS | Status: AC | PRN
  Administered 2023-10-24: 500 [IU]
  Filled 2023-10-24: qty 5

## 2023-10-24 NOTE — Progress Notes (Signed)
Diagnosis: Dehydration  Provider:  Chilton Greathouse MD  Procedure: IV Infusion  IV Type: Port a Cath, IV Location: R Chest  Normal Saline, Dose: 1000 ml  Infusion Start Time: 0825  Infusion Stop Time: 0933  Post Infusion IV Care: Port a Cath Deaccessed/Flushedand heparin locked.  Discharge: Condition: Good, Destination: Home . AVS Declined  Performed by:  Garnette Czech, RN

## 2023-10-25 ENCOUNTER — Other Ambulatory Visit: Payer: Medicare Other

## 2023-10-25 ENCOUNTER — Encounter: Payer: Self-pay | Admitting: Gastroenterology

## 2023-10-25 ENCOUNTER — Ambulatory Visit: Payer: Self-pay | Admitting: Family Medicine

## 2023-10-25 ENCOUNTER — Ambulatory Visit (INDEPENDENT_AMBULATORY_CARE_PROVIDER_SITE_OTHER): Payer: Medicare Other | Admitting: Gastroenterology

## 2023-10-25 VITALS — BP 130/80 | HR 113 | Ht 63.0 in | Wt 189.0 lb

## 2023-10-25 DIAGNOSIS — R32 Unspecified urinary incontinence: Secondary | ICD-10-CM | POA: Diagnosis not present

## 2023-10-25 DIAGNOSIS — R198 Other specified symptoms and signs involving the digestive system and abdomen: Secondary | ICD-10-CM

## 2023-10-25 DIAGNOSIS — K50818 Crohn's disease of both small and large intestine with other complication: Secondary | ICD-10-CM | POA: Diagnosis not present

## 2023-10-25 DIAGNOSIS — Z932 Ileostomy status: Secondary | ICD-10-CM

## 2023-10-25 MED ORDER — DIPHENOXYLATE-ATROPINE 2.5-0.025 MG PO TABS: 2.0000 | ORAL_TABLET | Freq: Four times a day (QID) | ORAL | 3 refills | Status: AC

## 2023-10-25 MED ORDER — DICYCLOMINE HCL 10 MG PO CAPS: 10.0000 mg | ORAL_CAPSULE | Freq: Three times a day (TID) | ORAL | 3 refills | Status: DC | PRN

## 2023-10-25 NOTE — Telephone Encounter (Signed)
Second attempt to contact patient. No answer so left a voicemail with a request for call back.

## 2023-10-25 NOTE — Progress Notes (Signed)
HPI :  IBD history: 68 year old white female with IBD ( Crohn's) since 89.  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 She was never able to be put in remission prior to colectomy. She was only exposed to steroids prior to her surgery. Post-operatively, she was on Remicade for 3-4 years but did not help too much. She had been on remotely but did not work for her. She was on Humira but also did not put her into remission and she developed high antibody titers and it was stopped. Most recently on Entyvio and low dose . was stopped due to worsening anemia.  She reports multiple courses of antibiotics with some benefit historically.  She has been on budesonider with ? Relief. Father and daughter have UC.  She has seen Riccardo Dubin and Dr. 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 Most recently saw Dr. Inetta Fermo at [facility] and had diverting ileostomy April 23 2021. Returned for J pouch excision Jan 2023.  She has dealt with high ostomy output since that surgery.   SINCE THE LAST VISIT   68 year old female here for follow-up visit.  Recall she has had problems with increased ostomy output over time associated with dehydration and worsening renal function.  I have seen her a few times for this and have tried to adjust her bowel regimen.  She has undergone workup in the past to exclude active Crohn's disease with fecal calprotectin, CT scan, ileoscopy.  Those tests have not revealed any concerning pathology.  We have tried her on a variety of regimens to decrease her output including Imodium, cholestyramine, psyllium husk, most recently octreotide.  I had recommended stopping cholestyramine, placing her on psyllium husk, continuing octreotide, and using Imodium routinely.  She states she takes Imodium 2 tabs twice daily at times but is not reliable with  it.  She thinks she fills up at least 2 bags of her ostomy output per day if not more.  She has been having IV fluid infusions on Monday Wednesday and Friday to prevent dehydration and keep her out of the hospital.  I have seen notes from her nephrologist that her renal function is worsening and they are considering starting dialysis.  She also cares for her mother and states she has a hard time remembering to take all of her antidiarrheal medicines.  Since starting octreotide Depo she does state that it helps reduce her stool frequency, especially in the weeks after she received the injection.  She gets it once monthly.  She was not sure if the psyllium husk is helped her too much lately.  She was tried back on cholestyramine by her nephrologist but cannot really tell much of a difference.  She has some gas pains but no significant abdominal pains that bother her.  She is scheduled to get a dialysis catheter placed in March.  She also describes significant urinary incontinence.  She states she cannot hold it in and feels her pelvic floor is weaker after having gone through her J-pouch surgery.  We discussed otherwise what she is drinking, she is drinking soft drinks and water to help maintain her hydration.   Prior workup: Pouchoscopy 03/31/17 - anal fissure, mild anal canal stenosis, focal ulceration of pouch and more proximal ileum but improved compared to previous   Pouchoscopy 01/21/20 -  Preparation of  the colon was poor. - A suspected small perianal fistulous tract and anal canal stenosis found on digital rectal exam. - The ileal pouch has an angulated turn with very focal ulcerations, otherwise no inflammatory changes noted. biopsies obtained - Small area of nodularity along anal canal - biopsied to rule out AIN / condyloma - Suspected fistal of the distal pouch just proximal to anal canal Overall, only one focal ulceration noted and no other inflammatory changes seen. Incidentally noted  suspected new fistula   EGD 01/21/20 -  - A 3 cm hiatal hernia was present. - LA Grade A esophagitis was found 33 cm from the incisors - mild. - The exam of the esophagus was otherwise normal. No Barrett's esophagus. - Multiple small sessile polyps were found in the gastric fundus and in the gastric body, benign appearing, likely fundic gland polyps, a cluster was noted at the distal end of the hernia sac. Biopsies were taken with a cold forceps for histology. - The exam of the stomach was otherwise normal. - The duodenal bulb and second portion of the duodenum were normal.   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 Cellulitis extends superiorly throughout the posterior subcutaneous fat, adjacent to the gluteal and posterior paraspinal muscles. No evidence of abscess.     MRE 06/25/20 - IMPRESSION: 1. Persistent inflammatory changes near the upper gluteal cleft bilaterally between the gluteus maximus muscles. No discrete drainable soft tissue abscess or intramuscular involvement. 2. No MR findings for acute inflammatory small-bowel disease. However, study is limited without contrast.   CT scan abdomen/ pelvis without contrast 05/01/21: IMPRESSION: 1. Findings worrisome for an area of focal bowel perforation versus anastomosis leak within the anterior aspect of the mid abdomen, with extravasation of air and contrast into the adjacent surgical incision site. 2. Postoperative changes within subsequent right lower quadrant ileostomy. 3. Aortic atherosclerosis.   CT scan abdomen / pelvis without contrast 05/14/21: IMPRESSION: Status post proctocolectomy. Ileostomy is noted in right lower quadrant. There is no evidence of bowel obstruction seen at this time.   No definite acute abnormality is seen in the abdomen or pelvis  at this time.     CT abdomen / pelvis without contrast 09/22/21: IMPRESSION: 1. Patient is status post colectomy with right lower quadrant ileostomy. Small bowel loops in the upper abdomen and stomach appeared dilated concerning for small bowel obstruction. Transition point is not definitive, but may be at the level of the ostomy. No free air or pneumatosis. 2. There is wall thickening of the distal esophagus and proximal stomach worrisome for gastritis and esophagitis. 3. Small amount of air in the pelvis is unchanged from prior, likely postoperative. 4. Nonobstructing left renal calculus     CT abdomen / pelvis without contrast 06/29/22: IMPRESSION: Status post colectomy with ileostomy seen in right lower quadrant. No abnormal bowel dilatation or inflammation is noted. No acute abnormality seen in the abdomen or pelvis.     Ileoscopy 07/20/22: normal   Small bowel follow through 07/21/22: IMPRESSION: Right mid abdominal ileostomy. No findings to indicate disease progression.     ESR and CRP normal on 07/21/22   Fecal calprotectin 07/19/22 - 5     CT abdomen / pelvis 05/28/23: IMPRESSION: 1. No acute abnormality in the abdomen or pelvis. 2. Postsurgical changes from colectomy and right upper quadrant ileostomy. No evidence of  bowel obstruction. 3. Small hiatal hernia. 4.  Aortic Atherosclerosis (ICD10-I70.0).       Past Medical History:  Diagnosis Date   Abdominal pain    Hx   Acute pyelonephritis 12/07/2020   Allergy    Anal stenosis    Anemia    Anxiety    Arthritis    Asthma    patient does not have inhaler   Blood in stool    Hx   Blood in urine    Hx   Blood transfusion without reported diagnosis    Cataract    CKD (chronic kidney disease) stage 3, GFR 30-59 ml/min (HCC)    Crohn's colitis, other complication (HCC)    De Quervain's tenosynovitis    Depression    Difficulty urinating    Hx   Easy bruising    Esophagitis    Fainting     History - resolved - due to dehydration   Fatigue    Hx   Fibroid    Hx   Gastric polyp    GERD (gastroesophageal reflux disease)    Hearing loss    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   Hemorrhoids, external    Hemorrhoids, internal    Herpes, genital    vaginal treated 07/05/12 and pt states is resolved   History of cervical dysplasia    History of small bowel obstruction    Hyperlipidemia    currently no meds   Hyperparathyroidism    Hypertension    Hypokalemia    Hx   Hypotension 06/29/2022   IBD (inflammatory bowel disease)    initially colectomy for suspected UC, 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   Obesity    Ovarian cyst    Pain in joint, pelvic region and thigh 12/16/2008   Centricity Description: HIP PAIN, LEFT  Qualifier: Diagnosis of   By: Janit Bern      Centricity Description: HIP PAIN  Qualifier: Diagnosis of   By: Janit Bern       Pain of right thumb 04/12/2018   Poor dental hygiene    Pulmonary nodule, right    right upper lobe   Rectal bleeding    Hx   Rectal pain    Hx   Renal insufficiency    CKD - stage 3   RLS (restless legs syndrome)    no meds   Sepsis secondary to UTI (HCC) 06/29/2022   Severe sepsis (HCC) 12/07/2020   Tooth infection 11/2016   right low   Ulcerative colitis    Visual disturbance    wears glasses   Weakness generalized    Hx - patient denies generalized weakness   Wears dentures    upper only     Past Surgical History:  Procedure Laterality Date   ANAL DILATION     BIOPSY  07/20/2022   Procedure: BIOPSY;  Surgeon: Meryl Dare, MD;  Location: [facility];  Service: Gastroenterology;;   CERVICAL BIOPSY  W/ LOOP ELECTRODE EXCISION     CHOLECYSTECTOMY     COLONOSCOPY     [name]   fatty tumor removed from back     X 2   HEMORRHOID SURGERY     ILEOSCOPY N/A 07/20/2022   Procedure: ILEOSCOPY THROUGH STOMA;  Surgeon: Meryl Dare, MD;   Location: [facility];  Service: Gastroenterology;  Laterality: N/A;   ILEOSTOMY CLOSURE     IR CV LINE  INJECTION  05/30/2023   IR CV LINE INJECTION  08/02/2023   IR IMAGING GUIDED PORT INSERTION  04/12/2023   RESTORATIVE PROCTOCOLECTOMY     with insertion of ileoanal J Pouch with loop ileostomy   SHOULDER ARTHROSCOPY WITH CAPSULORRHAPHY Left 06/14/2019   Procedure: LEFT SHOULDER ARTHRSCOPIC REPAIR OF BONY BANKART FRACTURE;  Surgeon: Jones Broom, MD;  Location: [facility];  Service: Orthopedics;  Laterality: Left;  REQUEST 90 MINUTE   SIGMOIDOSCOPY     TOTAL ABDOMINAL HYSTERECTOMY  1998   TAH/LSO   TUBAL LIGATION     UPPER GASTROINTESTINAL ENDOSCOPY     [name]   Family History  Problem Relation Age of Onset   Hypertension Mother    Heart disease Mother        s/p pci   Ulcerative colitis Father    Hypertension Father    Heart attack Father    Diabetes Sister    Cancer Sister        uterine   Pulmonary fibrosis Sister    Cancer Maternal Uncle        LUNG   Ulcerative colitis Daughter    Liver disease Daughter    Irritable bowel syndrome Other        grandchildren   Colon cancer Neg Hx    Esophageal cancer Neg Hx    Stomach cancer Neg Hx    Rectal cancer Neg Hx    Social History   Tobacco Use   Smoking status: Former    Current packs/day: 0.00    Average packs/day: 1 pack/day for 4.0 years (4.0 ttl pk-yrs)    Types: Cigarettes    Start date: 05/21/1975    Quit date: 05/21/1979    Years since quitting: 44.4   Smokeless tobacco: Never  Vaping Use   Vaping status: Never Used  Substance Use Topics   Alcohol use: No    Alcohol/week: 0.0 standard drinks of alcohol   Drug use: No   Current Outpatient Medications  Medication Sig Dispense Refill   acetaminophen (TYLENOL) 500 MG tablet Take 1,000 mg by mouth every 6 (six) hours as needed (for pain/sleep).     allopurinol (ZYLOPRIM) 300 MG tablet Take 0.5 tablets (150 mg total) by mouth at bedtime. (Patient taking  differently: Take 300 mg by mouth at bedtime.) 90 tablet 1   Calcium Carbonate Antacid (TUMS EXTRA STRENGTH 750 PO) Take 1-2 tablets by mouth every 6 (six) hours as needed (CHEW for heartburn).     DULoxetine (CYMBALTA) 60 MG capsule Take 2 capsules (120 mg total) by mouth daily. 60 capsule 11   folic acid (FOLVITE) 1 MG tablet Take 2 tablets (2 mg total) by mouth daily. (Patient taking differently: Take 1 mg by mouth daily.) 180 tablet 3   GAS-X EXTRA STRENGTH 125 MG CAPS Take 125 mg by mouth at bedtime.     loperamide (IMODIUM A-D) 2 MG tablet Take 1 tablet (2 mg total) by mouth 3 (three) times daily. Titrate as needed     metoprolol succinate (TOPROL-XL) 100 MG 24 hr tablet Take 100 mg by mouth at bedtime.     OVER THE COUNTER MEDICATION Take 1 tablet by mouth See admin instructions. Nervive Nerve Health tablet- Take 1 tablet by mouth at bedtime     Psyllium (METAMUCIL PREMIUM BLEND) 52.63 % POWD Take Daily     Vitamin D, Ergocalciferol, (DRISDOL) 1.25 MG (50000 UNIT) CAPS capsule Take 50,000 Units by mouth every 30 (thirty) days.     apixaban (ELIQUIS)  5 MG TABS tablet Take 1 tablet (5 mg total) by mouth 2 (two) times daily. 180 tablet 0   No current facility-administered medications for this visit.   Facility-Administered Medications Ordered in Other Visits  Medication Dose Route Frequency Provider Last Rate Last Admin   heparin lock flush 100 unit/mL  500 Units Intracatheter Once PRN Terrial Rhodes, MD       sodium chloride 0.9 % bolus 1,000 mL  1,000 mL Intravenous Once Terrial Rhodes, MD       sodium chloride 0.9 % bolus 1,000 mL  1,000 mL Intravenous Once Terrial Rhodes, MD       Allergies  Allergen Reactions   Sulfa Antibiotics Hives, Itching, Rash and Swelling   Morphine Other (See Comments)    "Gives me crazy dreams" (delusions, also) Psychosis, also    Sulfonamide Derivatives Hives, Itching and Swelling     Review of Systems: All systems reviewed and negative  except where noted in HPI.   Lab Results  Component Value Date   WBC 11.9 (H) 09/13/2023   HGB 10.4 (L) 09/13/2023   HCT 32.4 (L) 09/13/2023   MCV 104.1 (H) 09/13/2023   PLT 351.0 09/13/2023    Lab Results  Component Value Date   NA 137 09/13/2023   CL 108 09/13/2023   K 3.8 09/13/2023   CO2 15 (L) 09/13/2023   BUN 42 (H) 09/13/2023   CREATININE 3.56 (H) 09/13/2023   EGFR 14.0 09/26/2023   CALCIUM 7.4 (L) 09/13/2023   PHOS 2.7 06/01/2023   ALBUMIN 4.0 09/13/2023   GLUCOSE 126 (H) 09/13/2023    Lab Results  Component Value Date   ALT 14 09/13/2023   AST 20 09/13/2023   ALKPHOS 138 (H) 09/13/2023   BILITOT 0.4 09/13/2023     Physical Exam: BP 130/80   Pulse (!) 113   Ht 5\' 3"  (1.6 m)   Wt 189 lb (85.7 kg)   BMI 33.48 kg/m  Constitutional: Pleasant,well-developed, female in no acute distress. Neurological: Alert and oriented to person place and time. Psychiatric: Normal mood and affect. Behavior is normal.   ASSESSMENT: 68 y.o. female here for assessment of the following  1. High output ileostomy (HCC)   2. Crohn's disease of both small and large intestine with other complication (HCC)    Complicated Crohn's disease status post colectomy with J-pouch excision now with end ileostomy.  She has had high output ostomy in recent years unfortunately leading to recurrent dehydration and progressive kidney disease.  She is now on maintenance IV fluids as outpatient to prevent positional dehydration.  Unfortunately her kidney disease has progressed where she may be needing dialysis soon.  Based on her prior workup we have not found any active Crohn's disease which is causing this. We have tried several regimens to help reduce her output.  Most recently octreotide Depo injections have been added which she subjectively thinks has helped slowed it, particularly helpful after the first few weeks after the injection.  She feels the output picks up closer to the time of her next  injection.  I had intended for her to take Imodium on a scheduled basis and she is taking it but she cares for her mother and has a hard time being compliant with it.  I do think she has room to go with her antidiarrheals.  I do not think cholestyramine is a great idea for this, it is not helping her and will stop it.  Discussed other options.  In regards  to her oral hydration, she is drinking a lot of soda and water.  I recommend she drink Pedialyte more frequently and try to avoid sugary drinks.  We will continue the octreotide.  I will switch the Imodium out to Lomotil and will start at 2 tabs 4 times daily.  I will also add Bentyl 10 mg every 8 hours to use as needed for cramps and worsening diarrhea.  We will contact her in 2 weeks for reassessment and see how she is doing.  If she is no better we may start codeine as well.  Need to maximize her medical therapy for this and I stressed the importance of compliance with the Lomotil, I do think this will help her.  Otherwise we discussed her urinary incontinence which is significant and worsened since her J-pouch surgery.  Will refer her to pelvic floor PT to check her pelvic floor and see if they can help this problem   PLAN: - stop cholestyramine - stop immodium - continue IVF M,W,F - drink pedialyte as maintenance oral fluid, stop soda - continue octreotide - lab today for fecal calprotectin to make sure no active inflammation - start lomotil 2 tabs four times daily - start bentyl 10mg  every 8 hours PRN  - call her in 2 weeks for reassessment and see how she is doing - if no better in 2 weeks will start codeine - referral to pelvic floor PT for urinary incontinence  Harlin Rain, MD [facility]astro[facility]

## 2023-10-25 NOTE — Telephone Encounter (Signed)
This RN made third and final attempt to triage patient. No answer, left a message. Routing to office.

## 2023-10-25 NOTE — Telephone Encounter (Signed)
First attempt to contact patient. No answer so left a voicemail with a request for call back.

## 2023-10-25 NOTE — Patient Instructions (Addendum)
We have sent the following medications to your pharmacy for you to pick up at your convenience: Lomotil: Take 2 tablets 4 times daily  Stop cholestyramine.   Stop Imodium  Drink Pedialyte.  We are referring you to Pelvic Floor Physical Therapy.  They will contact you directly to schedule an appointment.  It may take a week or more before you hear from them.  Please feel free to contact us if you have not heard from them within 2 weeks and we will follow up on the referral.   Please go to the lab in the basement of our building to get collection device to have fecal calprotectin done as you leave today. Hit "B" for basement when you get on the elevator.  When the doors open the lab is on your left.  We will call you with the results. Thank you.  We will check on you in 2 weeks. Feel free to call with an update.  Thank you for entrusting me with your care and for choosing [facility], Dr. Ileene Patrick  If your blood pressure at your visit was 140/90 or greater, please contact your primary care physician to follow up on this. ______________________________________________________  If you are age 64 or older, your body mass index should be between 23-30. Your Body mass index is 33.48 kg/m. If this is out of the aforementioned range listed, please consider follow up with your Primary Care Provider.  If you are age 64 or younger, your body mass index should be between 19-25. Your Body mass index is 33.48 kg/m. If this is out of the aformentioned range listed, please consider follow up with your Primary Care Provider.  ________________________________________________________  The [facility] providers would like to encourage you to use [facility] to communicate with providers for non-urgent requests or questions.  Due to long hold times on the telephone, sending your provider a message by [facility] may be a faster and more efficient way to get a response.  Please allow 48 business hours for  a response.  Please remember that this is for non-urgent requests.  _______________________________________________________  Due to recent changes in healthcare laws, you may see the results of your imaging and laboratory studies on MyChart before your provider has had a chance to review them.  We understand that in some cases there may be results that are confusing or concerning to you. Not all laboratory results come back in the same time frame and the provider may be waiting for multiple results in order to interpret others.  Please give [place] 48 hours in order for your provider to thoroughly review all the results before contacting the office for clarification of your results.

## 2023-10-25 NOTE — Telephone Encounter (Addendum)
  Chief Complaint: Gum swelling Symptoms: Gum pain, HS swelling Frequency: x2weeks, off and on Pertinent Negatives: Patient denies drainage, redness, bleeding, pus, fever, difficulty swallowing, chills/body aches Disposition: [] ED /[] Urgent Care (no appt availability in office) / [x] Appointment(In office/virtual)/ []  [facility]/ [] Home Care/ [x] Refused Recommended Disposition /[]  Mobile Bus/ []  Follow-up with PCP Additional Notes: Patient called regarding possible mouth infection and wanting antibiotics ordered to prevent worsening. Patient states that one year ago she had an abscess in lower gums that resulted in antibiotic therapy, tooth removal, and an  insert of a "post" in area. Patient states that she recently bit down on it and now is experiencing swelling at night time and soreness/pain that won't get any better. Patient denies bleeding, drainage, redness, fever, or body aches/chills. Patient advised to be seen by provider per protocol however patient refused, stating that she just wants to see if PCP will order abx and if she doesn't she will call back. Patient advised that dentist may be needed however patient states that the dentists only does things like dentures. Patient advised by this RN that note will be routed to office for PCP review and to call back with worsening symptoms. Patient verbalized understanding.  Reason for Disposition  All other mouth symptoms (Exceptions: dry mouth from not drinking enough liquids, chapped lips)  Answer Assessment - Initial Assessment Questions 1. SYMPTOM: "What's the main symptom you're concerned about?" (e.g., chapped lips, dry mouth, lump, sores)     Swollen gums, sore 2. ONSET: "When did the  start?"     Two weeks 3. PAIN: "Is there any pain?" If Yes, ask: "How bad is it?" (Scale: 1-10; mild, moderate, severe)   - MILD (1-3):  doesn't interfere with eating or normal activities   - MODERATE (4-7): interferes with eating  some solids and normal activities   - SEVERE (8-10):  excruciating pain, interferes with most normal activities   - SEVERE DYSPHAGIA: can't swallow liquids, drooling     mild 4. CAUSE: "What do you think is causing the symptoms?"     "I had an abscess like one year ago and they put a post in there and I bit down on it and now I think it might be infected because it swells." 5. OTHER SYMPTOMS: "Do you have any other symptoms?" (e.g., fever, sore throat, toothache, swelling)  Protocols used: Mouth Symptoms-A-AH

## 2023-10-26 ENCOUNTER — Other Ambulatory Visit: Payer: Self-pay | Admitting: Family Medicine

## 2023-10-26 ENCOUNTER — Ambulatory Visit: Payer: Medicare Other

## 2023-10-26 VITALS — BP 145/85 | HR 91 | Temp 98.1°F | Resp 20 | Ht 63.0 in | Wt 187.6 lb

## 2023-10-26 DIAGNOSIS — E860 Dehydration: Secondary | ICD-10-CM | POA: Diagnosis not present

## 2023-10-26 DIAGNOSIS — K047 Periapical abscess without sinus: Secondary | ICD-10-CM

## 2023-10-26 MED ORDER — CEPHALEXIN 500 MG PO CAPS
500.0000 mg | ORAL_CAPSULE | Freq: Two times a day (BID) | ORAL | 0 refills | Status: DC
Start: 2023-10-26 — End: 2024-05-03

## 2023-10-26 MED ORDER — HEPARIN SOD (PORK) LOCK FLUSH 100 UNIT/ML IV SOLN
500.0000 [IU] | Freq: Once | INTRAVENOUS | Status: AC | PRN
  Administered 2023-10-26: 500 [IU]
  Filled 2023-10-26: qty 5

## 2023-10-26 MED ORDER — SODIUM CHLORIDE 0.9 % IV BOLUS
1000.0000 mL | Freq: Once | INTRAVENOUS | Status: AC
  Administered 2023-10-26: 1000 mL via INTRAVENOUS
  Filled 2023-10-26: qty 1000

## 2023-10-26 NOTE — Progress Notes (Signed)
Diagnosis: Dehydration  Provider:  Chilton Greathouse MD  Procedure: IV Infusion  IV Type: Port a Cath, IV Location: R Chest  Normal Saline, Dose: 1000 ml  Infusion Start Time: 0811  Infusion Stop Time: 0920  Post Infusion IV Care: Patient declined observation and Port a Cath Deaccessed/Flushed and heparin locked  Discharge: Condition: Good, Destination: Home . AVS Declined  Performed by:  Wyvonne Lenz, RN

## 2023-10-27 ENCOUNTER — Encounter (HOSPITAL_BASED_OUTPATIENT_CLINIC_OR_DEPARTMENT_OTHER): Payer: Self-pay

## 2023-10-27 NOTE — Telephone Encounter (Signed)
 Patient notified

## 2023-10-28 ENCOUNTER — Ambulatory Visit: Payer: Medicare Other

## 2023-10-28 VITALS — BP 133/80 | HR 78 | Temp 97.7°F | Resp 16 | Ht 63.0 in | Wt 188.0 lb

## 2023-10-28 DIAGNOSIS — E860 Dehydration: Secondary | ICD-10-CM | POA: Diagnosis not present

## 2023-10-28 MED ORDER — HEPARIN SOD (PORK) LOCK FLUSH 100 UNIT/ML IV SOLN
500.0000 [IU] | Freq: Once | INTRAVENOUS | Status: AC | PRN
Start: 2023-10-28 — End: 2023-10-28
  Administered 2023-10-28: 500 [IU]

## 2023-10-28 MED ORDER — SODIUM CHLORIDE 0.9 % IV BOLUS
1000.0000 mL | Freq: Once | INTRAVENOUS | Status: AC
  Administered 2023-10-28: 1000 mL via INTRAVENOUS
  Filled 2023-10-28: qty 1000

## 2023-10-28 MED ORDER — HEPARIN SOD (PORK) LOCK FLUSH 100 UNIT/ML IV SOLN
250.0000 [IU] | Freq: Once | INTRAVENOUS | Status: DC | PRN
  Filled 2023-10-28: qty 5

## 2023-10-28 NOTE — Progress Notes (Signed)
Diagnosis: Dehydration  Provider:  Chilton Greathouse MD  Procedure: IV Infusion  IV Type: Port a Cath, IV Location: R Chest  Normal Saline, Dose: 1000 ml  Infusion Start Time: 0824  Infusion Stop Time: 0934  Post Infusion IV Care: Patient declined observation and Port a Cath Deaccessed/Flushed and heparin locked  Discharge: Condition: Stable, Destination: Home . AVS Declined  Performed by:  Wyvonne Lenz, RN

## 2023-10-31 ENCOUNTER — Ambulatory Visit (INDEPENDENT_AMBULATORY_CARE_PROVIDER_SITE_OTHER): Payer: Medicare Other

## 2023-10-31 VITALS — BP 162/86 | HR 95 | Temp 97.6°F | Resp 20 | Ht 63.0 in | Wt 190.6 lb

## 2023-10-31 DIAGNOSIS — E860 Dehydration: Secondary | ICD-10-CM | POA: Diagnosis not present

## 2023-10-31 MED ORDER — SODIUM CHLORIDE 0.9 % IV BOLUS
1000.0000 mL | Freq: Once | INTRAVENOUS | Status: AC
  Administered 2023-10-31: 1000 mL via INTRAVENOUS
  Filled 2023-10-31: qty 1000

## 2023-10-31 MED ORDER — HEPARIN SOD (PORK) LOCK FLUSH 100 UNIT/ML IV SOLN
500.0000 [IU] | Freq: Once | INTRAVENOUS | Status: AC | PRN
  Administered 2023-10-31: 500 [IU]
  Filled 2023-10-31: qty 5

## 2023-10-31 MED ORDER — HEPARIN SOD (PORK) LOCK FLUSH 100 UNIT/ML IV SOLN: 250.0000 [IU] | Freq: Once | INTRAVENOUS | Status: DC | PRN

## 2023-10-31 NOTE — Progress Notes (Signed)
 Diagnosis: Dehydration  Provider:  Chilton Greathouse MD  Procedure: IV Infusion  IV Type: Port a Cath, IV Location: R Chest  Normal Saline, Dose: 1000 ml  Infusion Start Time: 0827  Infusion Stop Time: 0937  Post Infusion IV Care: Patient declined observation and Port a Cath Deaccessed/Flushed and heparin locked  Discharge: Condition: Good, Destination: Home . AVS Declined  Performed by:  Wyvonne Lenz, RN

## 2023-11-02 ENCOUNTER — Ambulatory Visit (INDEPENDENT_AMBULATORY_CARE_PROVIDER_SITE_OTHER): Payer: Medicare Other

## 2023-11-02 VITALS — BP 111/72 | HR 91 | Temp 97.8°F | Resp 16 | Ht 63.0 in | Wt 190.4 lb

## 2023-11-02 DIAGNOSIS — E860 Dehydration: Secondary | ICD-10-CM

## 2023-11-02 MED ORDER — HEPARIN SOD (PORK) LOCK FLUSH 100 UNIT/ML IV SOLN
250.0000 [IU] | Freq: Once | INTRAVENOUS | Status: DC | PRN
  Filled 2023-11-02: qty 5

## 2023-11-02 MED ORDER — HEPARIN SOD (PORK) LOCK FLUSH 100 UNIT/ML IV SOLN
500.0000 [IU] | Freq: Once | INTRAVENOUS | Status: AC | PRN
  Administered 2023-11-02: 500 [IU]

## 2023-11-02 MED ORDER — SODIUM CHLORIDE 0.9 % IV BOLUS
1000.0000 mL | Freq: Once | INTRAVENOUS | Status: AC
  Administered 2023-11-02: 1000 mL via INTRAVENOUS
  Filled 2023-11-02: qty 1000

## 2023-11-02 NOTE — Progress Notes (Signed)
 Diagnosis: Dehydration  Provider:  Chilton Greathouse MD  Procedure: IV Infusion  IV Type: Port a Cath, IV Location: R Chest  Normal Saline, Dose: 1000 ml  Infusion Start Time: 0825  Infusion Stop Time: 0934  Post Infusion IV Care: Port a Cath Deaccessed/Flushed and heparin locked.  Discharge: Condition: Good, Destination: Home . AVS Provided  Performed by:  Garnette Czech, RN

## 2023-11-04 ENCOUNTER — Ambulatory Visit: Payer: Medicare Other

## 2023-11-04 ENCOUNTER — Telehealth: Payer: Self-pay | Admitting: Pharmacy Technician

## 2023-11-04 VITALS — BP 142/85 | HR 83 | Temp 97.7°F | Resp 16 | Ht 63.0 in | Wt 192.2 lb

## 2023-11-04 DIAGNOSIS — E860 Dehydration: Secondary | ICD-10-CM | POA: Diagnosis not present

## 2023-11-04 DIAGNOSIS — Z932 Ileostomy status: Secondary | ICD-10-CM | POA: Diagnosis not present

## 2023-11-04 DIAGNOSIS — K50819 Crohn's disease of both small and large intestine with unspecified complications: Secondary | ICD-10-CM | POA: Diagnosis not present

## 2023-11-04 DIAGNOSIS — R198 Other specified symptoms and signs involving the digestive system and abdomen: Secondary | ICD-10-CM

## 2023-11-04 MED ORDER — OCTREOTIDE ACETATE 20 MG IM KIT
20.0000 mg | PACK | Freq: Once | INTRAMUSCULAR | Status: AC
  Administered 2023-11-04: 20 mg via INTRAMUSCULAR
  Filled 2023-11-04: qty 1

## 2023-11-04 MED ORDER — HEPARIN SOD (PORK) LOCK FLUSH 100 UNIT/ML IV SOLN: 250.0000 [IU] | Freq: Once | INTRAVENOUS | Status: DC | PRN

## 2023-11-04 MED ORDER — HEPARIN SOD (PORK) LOCK FLUSH 100 UNIT/ML IV SOLN
500.0000 [IU] | Freq: Once | INTRAVENOUS | Status: AC | PRN
  Administered 2023-11-04: 500 [IU]
  Filled 2023-11-04: qty 5

## 2023-11-04 MED ORDER — SODIUM CHLORIDE 0.9 % IV BOLUS
1000.0000 mL | Freq: Once | INTRAVENOUS | Status: AC
  Administered 2023-11-04: 1000 mL via INTRAVENOUS
  Filled 2023-11-04: qty 1000

## 2023-11-04 NOTE — Telephone Encounter (Addendum)
 PHARMACY BENEFIT  Auth Submission: Approved Site of care: Site of care: [facility] Payer: [employer] MEDICARE Medication & CPT/J Code(s) submitted: Sandostatin (Octreotide LAR) Z6109 Route of submission (phone, fax, portal): CMM Phone # Fax # Auth type: Buy/Bill PB Units/visits requested: 20MG  Q28DAYS Reference number:  Freada Bergeron: [id number]) - [id number] Approval from: 10/09/23 - 09/05/24

## 2023-11-04 NOTE — Progress Notes (Signed)
 Diagnosis: Dehydration  Provider:  Chilton Greathouse MD  Procedure: IV Infusion  IV Type: Port a Cath, IV Location: R Chest  Normal Saline, Dose: 1000 ml  Infusion Start Time: 0827  Infusion Stop Time: 0937  Post Infusion IV Care: Patient declined observation and Port a Cath Deaccessed/Flushed and heparin locked  Discharge: Condition: Good, Destination: Home . AVS Declined  Performed by:  Wyvonne Lenz, RN   Diagnosis: Crohn's Disease  Provider:  Chilton Greathouse MD  Procedure: Injection  Sandostatin (octreotide), Dose: 20 mg, Site: intramuscular, Number of injections: 1  Injection Site(s): Right upper quad. gluteus  Post Care: Patient declined observation  Discharge: Condition: Good, Destination: Home . AVS Declined  Performed by:  Wyvonne Lenz, RN

## 2023-11-07 ENCOUNTER — Ambulatory Visit: Payer: Medicare Other

## 2023-11-07 VITALS — BP 152/84 | HR 75 | Temp 97.5°F | Resp 20 | Ht 63.0 in | Wt 190.0 lb

## 2023-11-07 DIAGNOSIS — E860 Dehydration: Secondary | ICD-10-CM

## 2023-11-07 MED ORDER — HEPARIN SOD (PORK) LOCK FLUSH 100 UNIT/ML IV SOLN
500.0000 [IU] | Freq: Once | INTRAVENOUS | Status: AC | PRN
  Administered 2023-11-07: 500 [IU]
  Filled 2023-11-07: qty 5

## 2023-11-07 MED ORDER — SODIUM CHLORIDE 0.9 % IV BOLUS
1000.0000 mL | Freq: Once | INTRAVENOUS | Status: AC
  Administered 2023-11-07: 1000 mL via INTRAVENOUS
  Filled 2023-11-07: qty 1000

## 2023-11-07 NOTE — Progress Notes (Signed)
 Diagnosis: Dehydration  Provider:  Chilton Greathouse MD  Procedure: IV Infusion  IV Type: Port a Cath, IV Location: R Chest  Normal Saline, Dose: 1000 ml  Infusion Start Time: 0822  Infusion Stop Time: 0931  Post Infusion IV Care: Port a Cath Deaccessed/Flushed and Heparin locked.  Discharge: Condition: Good, Destination: Home . AVS Declined  Performed by:  Garnette Czech, RN

## 2023-11-09 ENCOUNTER — Telehealth: Payer: Self-pay

## 2023-11-09 ENCOUNTER — Ambulatory Visit: Payer: Medicare Other

## 2023-11-09 VITALS — BP 141/79 | HR 88 | Temp 98.2°F | Resp 16 | Ht 63.0 in | Wt 188.8 lb

## 2023-11-09 DIAGNOSIS — E860 Dehydration: Secondary | ICD-10-CM

## 2023-11-09 MED ORDER — HEPARIN SOD (PORK) LOCK FLUSH 100 UNIT/ML IV SOLN
500.0000 [IU] | Freq: Once | INTRAVENOUS | Status: AC | PRN
Start: 2023-11-09 — End: 2023-11-09
  Administered 2023-11-09: 500 [IU]
  Filled 2023-11-09: qty 5

## 2023-11-09 MED ORDER — HEPARIN SOD (PORK) LOCK FLUSH 100 UNIT/ML IV SOLN: 250.0000 [IU] | Freq: Once | INTRAVENOUS | Status: DC | PRN

## 2023-11-09 MED ORDER — ANTICOAGULANT SODIUM CITRATE 4% (200MG/5ML) IV SOLN: 5.0000 mL | Freq: Once | Status: DC | PRN

## 2023-11-09 MED ORDER — SODIUM CHLORIDE 0.9 % IV BOLUS
1000.0000 mL | Freq: Once | INTRAVENOUS | Status: AC
  Administered 2023-11-09: 1000 mL via INTRAVENOUS
  Filled 2023-11-09: qty 1000

## 2023-11-09 NOTE — Telephone Encounter (Signed)
 [facility] is pn [address] in [place] The ph: [phone], fax: [phone]

## 2023-11-09 NOTE — Telephone Encounter (Signed)
-----   Message from [facility] Marylu Lund H sent at 10/25/2023  3:42 PM EST ----- Regarding: call pt to get status Call patient to get status and let Dr. Adela Lank know.

## 2023-11-09 NOTE — Telephone Encounter (Signed)
 Called and LM for patient that Dr. Adela Lank would like to see how she is doing. Asked her to call us back with an update.  We also rec'd a fax from [facility] indicated that patient has chosen to use them as her primary pharmacy.  Asked patient to let us know if she wants [place] to update her chart to [facility].  Currently show [facility] in [place] [address] as her primary pharmacy

## 2023-11-09 NOTE — Progress Notes (Signed)
 Diagnosis: Dehydration  Provider:  Chilton Greathouse MD  Procedure: IV Infusion  IV Type: Port a Cath, IV Location: R Chest  Normal Saline, Dose: 1000 ml  Infusion Start Time: 0823  Infusion Stop Time: 0932  Post Infusion IV Care: Port a Cath Deaccessed/Flushed  Discharge: Condition: Good, Destination: Home . AVS Declined  Performed by:  Wyvonne Lenz, RN

## 2023-11-11 ENCOUNTER — Ambulatory Visit: Payer: Medicare Other

## 2023-11-11 VITALS — BP 142/84 | HR 80 | Temp 98.2°F | Resp 20 | Ht 63.0 in | Wt 192.0 lb

## 2023-11-11 DIAGNOSIS — E860 Dehydration: Secondary | ICD-10-CM | POA: Diagnosis not present

## 2023-11-11 MED ORDER — HEPARIN SOD (PORK) LOCK FLUSH 100 UNIT/ML IV SOLN: 250.0000 [IU] | Freq: Once | INTRAVENOUS | Status: DC | PRN

## 2023-11-11 MED ORDER — SODIUM CHLORIDE 0.9 % IV BOLUS
1000.0000 mL | Freq: Once | INTRAVENOUS | Status: AC
  Administered 2023-11-11: 1000 mL via INTRAVENOUS
  Filled 2023-11-11: qty 1000

## 2023-11-11 MED ORDER — HEPARIN SOD (PORK) LOCK FLUSH 100 UNIT/ML IV SOLN
500.0000 [IU] | Freq: Once | INTRAVENOUS | Status: AC | PRN
  Administered 2023-11-11: 500 [IU]
  Filled 2023-11-11: qty 5

## 2023-11-11 NOTE — Progress Notes (Signed)
 Diagnosis: Dehydration  Provider:  Chilton Greathouse MD  Procedure: IV Infusion  IV Type: Port a Cath, IV Location: R Chest  Normal Saline, Dose: 1000 ml  Infusion Start Time: 0829  Infusion Stop Time: 0940  Post Infusion IV Care: Port a Cath Deaccessed/Flushed  Discharge: Condition: Good, Destination: Home . AVS Declined  Performed by:  Wyvonne Lenz, RN

## 2023-11-14 ENCOUNTER — Telehealth: Payer: Self-pay | Admitting: Pharmacy Technician

## 2023-11-14 ENCOUNTER — Other Ambulatory Visit: Payer: Self-pay

## 2023-11-14 ENCOUNTER — Ambulatory Visit: Payer: Medicare Other

## 2023-11-14 ENCOUNTER — Encounter: Payer: Self-pay | Admitting: Nephrology

## 2023-11-14 ENCOUNTER — Encounter: Payer: Self-pay | Admitting: Family

## 2023-11-14 ENCOUNTER — Other Ambulatory Visit (HOSPITAL_COMMUNITY): Payer: Self-pay

## 2023-11-14 ENCOUNTER — Encounter: Payer: Self-pay | Admitting: Gastroenterology

## 2023-11-14 VITALS — BP 128/79 | HR 93 | Temp 97.9°F | Resp 18 | Ht 63.0 in | Wt 189.2 lb

## 2023-11-14 DIAGNOSIS — E860 Dehydration: Secondary | ICD-10-CM

## 2023-11-14 MED ORDER — HEPARIN SOD (PORK) LOCK FLUSH 100 UNIT/ML IV SOLN
500.0000 [IU] | Freq: Once | INTRAVENOUS | Status: AC | PRN
  Administered 2023-11-14: 500 [IU]
  Filled 2023-11-14: qty 5

## 2023-11-14 MED ORDER — SODIUM CHLORIDE 0.9 % IV BOLUS
1000.0000 mL | Freq: Once | INTRAVENOUS | Status: AC
  Administered 2023-11-14: 1000 mL via INTRAVENOUS
  Filled 2023-11-14: qty 1000

## 2023-11-14 MED ORDER — HEPARIN SOD (PORK) LOCK FLUSH 100 UNIT/ML IV SOLN: 250.0000 [IU] | Freq: Once | INTRAVENOUS | Status: DC | PRN

## 2023-11-14 MED ORDER — OCTREOTIDE ACETATE 20 MG IM KIT
20.0000 mg | PACK | INTRAMUSCULAR | 6 refills | Status: DC
  Filled 2023-11-14: qty 1, 28d supply, fill #0

## 2023-11-14 NOTE — Telephone Encounter (Signed)
 Prescription has been sent to [facility] ou[facility] pharmacy. Refills wee given also.

## 2023-11-14 NOTE — Telephone Encounter (Signed)
 [name], Thanks.

## 2023-11-14 NOTE — Progress Notes (Signed)
 Diagnosis: Dehydration  Provider:  Chilton Greathouse MD  Procedure: IV Infusion  IV Type: Port a Cath, IV Location: R Chest  Normal Saline, Dose: 1000 ml  Infusion Start Time: 0834  Infusion Stop Time: 0940  Post Infusion IV Care: Patient declined observation and Port a Cath Deaccessed/Flushed and heparin locked  Discharge: Condition: Stable, Destination: Home . AVS Declined  Performed by:  Wyvonne Lenz, RN

## 2023-11-14 NOTE — Telephone Encounter (Signed)
 Patient has an upcoming appt for Octreotide treatement on 12/02/23.  Please send script to [facility], once received they will ship the medication to [place] for administration.  Thanks Selena Batten

## 2023-11-16 ENCOUNTER — Ambulatory Visit: Payer: Medicare Other

## 2023-11-16 ENCOUNTER — Other Ambulatory Visit: Payer: Self-pay | Admitting: *Deleted

## 2023-11-16 VITALS — BP 133/79 | HR 82 | Temp 97.8°F | Resp 16 | Ht 63.0 in | Wt 191.2 lb

## 2023-11-16 DIAGNOSIS — E860 Dehydration: Secondary | ICD-10-CM | POA: Diagnosis not present

## 2023-11-16 DIAGNOSIS — N186 End stage renal disease: Secondary | ICD-10-CM

## 2023-11-16 MED ORDER — HEPARIN SOD (PORK) LOCK FLUSH 100 UNIT/ML IV SOLN
250.0000 [IU] | Freq: Once | INTRAVENOUS | Status: DC | PRN
Start: 2023-11-16 — End: 2023-11-16

## 2023-11-16 MED ORDER — SODIUM CHLORIDE 0.9 % IV BOLUS
1000.0000 mL | Freq: Once | INTRAVENOUS | Status: AC
  Administered 2023-11-16: 1000 mL via INTRAVENOUS
  Filled 2023-11-16: qty 1000

## 2023-11-16 MED ORDER — HEPARIN SOD (PORK) LOCK FLUSH 100 UNIT/ML IV SOLN
500.0000 [IU] | Freq: Once | INTRAVENOUS | Status: AC | PRN
  Administered 2023-11-16: 500 [IU]
  Filled 2023-11-16: qty 5

## 2023-11-16 NOTE — Progress Notes (Signed)
 Diagnosis: Dehydration  Provider:  Chilton Greathouse MD  Procedure: IV Infusion  IV Type: Port a Cath, IV Location: R Chest Port flushes great but sluggish to blood return.Pt advised to contact IV team/Radiology for further evaluation. Pt verbalized understanding.  Normal Saline, Dose: 1000 ml  Infusion Start Time: 0846  Infusion Stop Time: 0957  Post Infusion IV Care: Port a Cath Deaccessed/Flushed and heparin locked.  Discharge: Condition: Good, Destination: Home . AVS Declined  Performed by:  Garnette Czech, RN

## 2023-11-17 ENCOUNTER — Ambulatory Visit (INDEPENDENT_AMBULATORY_CARE_PROVIDER_SITE_OTHER)
Admission: RE | Admit: 2023-11-17 | Discharge: 2023-11-17 | Discharge status: Home / Self Care | Payer: Medicare Other | Source: Ambulatory Visit | Attending: Vascular Surgery

## 2023-11-17 ENCOUNTER — Encounter: Payer: Self-pay | Admitting: Vascular Surgery

## 2023-11-17 ENCOUNTER — Ambulatory Visit (HOSPITAL_COMMUNITY)
Admission: RE | Admit: 2023-11-17 | Discharge: 2023-11-17 | Disposition: A | Discharge status: Home / Self Care | Payer: Medicare Other | Source: Ambulatory Visit | Attending: Vascular Surgery | Admitting: Vascular Surgery

## 2023-11-17 ENCOUNTER — Ambulatory Visit (INDEPENDENT_AMBULATORY_CARE_PROVIDER_SITE_OTHER): Payer: Medicare Other | Admitting: Vascular Surgery

## 2023-11-17 VITALS — BP 149/90 | HR 86 | Temp 97.9°F | Ht 63.0 in | Wt 188.7 lb

## 2023-11-17 DIAGNOSIS — N186 End stage renal disease: Secondary | ICD-10-CM

## 2023-11-17 DIAGNOSIS — N184 Chronic kidney disease, stage 4 (severe): Secondary | ICD-10-CM

## 2023-11-17 NOTE — Progress Notes (Signed)
 Office Note     CC:  [id number]  Requesting Provider:  Terrial Rhodes, MD  HPI: [name] is a Left handed 68 y.o. ([birth year]) female with kidney disease who presents at the request of Terrial Rhodes, MD for permanent HD access. The patient has had no prior access procedures.  No tunneled lines, has a right sided port due to small vasculature.    On exam, [name] was doing well.  Originally from [place], she moved to [place] years ago with her family.  She has been struggling with chronic kidney disease for quite some time.  She continues to make urine, and is not on dialysis yet. She stated that nursing tries to access the left sided cephalic vein as it is very superficial, but had such a difficult time that a port was placed so she can receive fluid 3 times a week.    Past Medical History:  Diagnosis Date   Abdominal pain    Hx   Acute pyelonephritis 12/07/2020   Allergy    Anal stenosis    Anemia    Anxiety    Arthritis    Asthma    patient does not have inhaler   Blood in stool    Hx   Blood in urine    Hx   Blood transfusion without reported diagnosis    Cataract    CKD (chronic kidney disease) stage 3, GFR 30-59 ml/min (HCC)    Crohn's colitis, other complication (HCC)    De Quervain's tenosynovitis    Depression    Difficulty urinating    Hx   Easy bruising    Esophagitis    Fainting    History - resolved - due to dehydration   Fatigue    Hx   Fibroid    Hx   Gastric polyp    GERD (gastroesophageal reflux disease)    Hearing loss    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   Hemorrhoids, external    Hemorrhoids, internal    Herpes, genital    vaginal treated 07/05/12 and pt states is resolved   History of cervical dysplasia    History of small bowel obstruction    Hyperlipidemia    currently no meds   Hyperparathyroidism    Hypertension    Hypokalemia    Hx   Hypotension 06/29/2022   IBD (inflammatory bowel disease)    initially colectomy  for suspected UC, 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   Obesity    Ovarian cyst    Pain in joint, pelvic region and thigh 12/16/2008   Centricity Description: HIP PAIN, LEFT  Qualifier: Diagnosis of   By: Janit Bern      Centricity Description: HIP PAIN  Qualifier: Diagnosis of   By: Janit Bern       Pain of right thumb 04/12/2018   Poor dental hygiene    Pulmonary nodule, right    right upper lobe   Rectal bleeding    Hx   Rectal pain    Hx   Renal insufficiency    CKD - stage 3   RLS (restless legs syndrome)    no meds   Sepsis secondary to UTI (HCC) 06/29/2022   Severe sepsis (HCC) 12/07/2020   Tooth infection 11/2016   right low   Ulcerative colitis    Visual disturbance    wears glasses   Weakness generalized  Hx - patient denies generalized weakness   Wears dentures    upper only    Past Surgical History:  Procedure Laterality Date   ANAL DILATION     BIOPSY  07/20/2022   Procedure: BIOPSY;  Surgeon: Meryl Dare, MD;  Location: [facility];  Service: Gastroenterology;;   CERVICAL BIOPSY  W/ LOOP ELECTRODE EXCISION     CHOLECYSTECTOMY     COLONOSCOPY     [name]   fatty tumor removed from back     X 2   HEMORRHOID SURGERY     ILEOSCOPY N/A 07/20/2022   Procedure: ILEOSCOPY THROUGH STOMA;  Surgeon: Meryl Dare, MD;  Location: [facility];  Service: Gastroenterology;  Laterality: N/A;   ILEOSTOMY CLOSURE     IR CV LINE INJECTION  05/30/2023   IR CV LINE INJECTION  08/02/2023   IR IMAGING GUIDED PORT INSERTION  04/12/2023   RESTORATIVE PROCTOCOLECTOMY     with insertion of ileoanal J Pouch with loop ileostomy   SHOULDER ARTHROSCOPY WITH CAPSULORRHAPHY Left 06/14/2019   Procedure: LEFT SHOULDER ARTHRSCOPIC REPAIR OF BONY BANKART FRACTURE;  Surgeon: Jones Broom, MD;  Location: [facility];  Service: Orthopedics;  Laterality: Left;  REQUEST 90 MINUTE   SIGMOIDOSCOPY     TOTAL ABDOMINAL  HYSTERECTOMY  1998   TAH/LSO   TUBAL LIGATION     UPPER GASTROINTESTINAL ENDOSCOPY     [name]    Social History   Socioeconomic History   Marital status: Divorced    Spouse name: Not on file   Number of children: Not on file   Years of education: Not on file   Highest education level: Not on file  Occupational History   Not on file  Tobacco Use   Smoking status: Former    Current packs/day: 0.00    Average packs/day: 1 pack/day for 4.0 years (4.0 ttl pk-yrs)    Types: Cigarettes    Start date: 05/21/1975    Quit date: 05/21/1979    Years since quitting: 44.5   Smokeless tobacco: Never  Vaping Use   Vaping status: Never Used  Substance and Sexual Activity   Alcohol use: No    Alcohol/week: 0.0 standard drinks of alcohol   Drug use: No   Sexual activity: Yes    Birth control/protection: Post-menopausal    Comment: 1st intercourse 68 yo-Fewer than 5 partners  Other Topics Concern   Not on file  Social History Narrative   Not on file   Social Drivers of Health   Financial Resource Strain: Low Risk  (05/26/2022)   Overall Financial Resource Strain (CARDIA)    Difficulty of Paying Living Expenses: Not hard at all  Food Insecurity: No Food Insecurity (05/28/2023)   Hunger Vital Sign    Worried About Running Out of Food in the Last Year: Never true    Ran Out of Food in the Last Year: Never true  Transportation Needs: No Transportation Needs (05/28/2023)   PRAPARE - Administrator, Civil Service (Medical): No    Lack of Transportation (Non-Medical): No  Physical Activity: Inactive (05/26/2022)   Exercise Vital Sign    Days of Exercise per Week: 0 days    Minutes of Exercise per Session: 0 min  Stress: No Stress Concern Present (05/26/2022)   [employer] of Occupational Health - Occupational Stress Questionnaire    Feeling of Stress : Not at all  Social Connections: Moderately Isolated (05/26/2022)   Social Connection and Isolation Panel [NHANES]  Frequency of Communication with Friends and Family: More than three times a week    Frequency of Social Gatherings with Friends and Family: More than three times a week    Attends Religious Services: Never    Database administrator or Organizations: No    Attends Banker Meetings: Never    Marital Status: Married  Catering manager Violence: Not At Risk (05/28/2023)   Humiliation, Afraid, Rape, and Kick questionnaire    Fear of Current or Ex-Partner: No    Emotionally Abused: No    Physically Abused: No    Sexually Abused: No   Family History  Problem Relation Age of Onset   Hypertension Mother    Heart disease Mother        s/p pci   Ulcerative colitis Father    Hypertension Father    Heart attack Father    Diabetes Sister    Cancer Sister        uterine   Pulmonary fibrosis Sister    Cancer Maternal Uncle        LUNG   Ulcerative colitis Daughter    Liver disease Daughter    Irritable bowel syndrome Other        grandchildren   Colon cancer Neg Hx    Esophageal cancer Neg Hx    Stomach cancer Neg Hx    Rectal cancer Neg Hx     Current Outpatient Medications  Medication Sig Dispense Refill   acetaminophen (TYLENOL) 500 MG tablet Take 1,000 mg by mouth every 6 (six) hours as needed (for pain/sleep).     allopurinol (ZYLOPRIM) 300 MG tablet Take 0.5 tablets (150 mg total) by mouth at bedtime. (Patient taking differently: Take 300 mg by mouth at bedtime.) 90 tablet 1   apixaban (ELIQUIS) 5 MG TABS tablet Take 1 tablet (5 mg total) by mouth 2 (two) times daily. 180 tablet 0   Calcium Carbonate Antacid (TUMS EXTRA STRENGTH 750 PO) Take 1-2 tablets by mouth every 6 (six) hours as needed (CHEW for heartburn).     cephALEXin (KEFLEX) 500 MG capsule Take 1 capsule (500 mg total) by mouth 2 (two) times daily. 20 capsule 0   dicyclomine (BENTYL) 10 MG capsule Take 1 capsule (10 mg total) by mouth every 8 (eight) hours as needed for spasms. 60 capsule 3    diphenoxylate-atropine (LOMOTIL) 2.5-0.025 MG tablet Take 2 tablets by mouth 4 (four) times daily. 240 tablet 3   DULoxetine (CYMBALTA) 60 MG capsule Take 2 capsules (120 mg total) by mouth daily. 60 capsule 11   folic acid (FOLVITE) 1 MG tablet Take 2 tablets (2 mg total) by mouth daily. (Patient taking differently: Take 1 mg by mouth daily.) 180 tablet 3   GAS-X EXTRA STRENGTH 125 MG CAPS Take 125 mg by mouth at bedtime.     metoprolol succinate (TOPROL-XL) 100 MG 24 hr tablet Take 100 mg by mouth at bedtime.     octreotide (SANDOSTATIN LAR DEPOT) 20 MG injection Inject 20 mg into the muscle every 28 (twenty-eight) days. 1 each 6   OVER THE COUNTER MEDICATION Take 1 tablet by mouth See admin instructions. Nervive Nerve Health tablet- Take 1 tablet by mouth at bedtime     Psyllium (METAMUCIL PREMIUM BLEND) 52.63 % POWD Take Daily     Vitamin D, Ergocalciferol, (DRISDOL) 1.25 MG (50000 UNIT) CAPS capsule Take 50,000 Units by mouth every 30 (thirty) days.     No current facility-administered medications for this visit.  Facility-Administered Medications Ordered in Other Visits  Medication Dose Route Frequency Provider Last Rate Last Admin   heparin lock flush 100 unit/mL  500 Units Intracatheter Once PRN Terrial Rhodes, MD       sodium chloride 0.9 % bolus 1,000 mL  1,000 mL Intravenous Once Terrial Rhodes, MD       sodium chloride 0.9 % bolus 1,000 mL  1,000 mL Intravenous Once Terrial Rhodes, MD        Allergies  Allergen Reactions   Sulfa Antibiotics Hives, Itching, Rash and Swelling   Morphine Other (See Comments)    "Gives me crazy dreams" (delusions, also) Psychosis, also    Sulfonamide Derivatives Hives, Itching and Swelling     REVIEW OF SYSTEMS:  [X]  denotes positive finding, [ ]  denotes negative finding Cardiac  Comments:  Chest pain or chest pressure:    Shortness of breath upon exertion:    Short of breath when lying flat:    Irregular heart rhythm:         Vascular    Pain in calf, thigh, or hip brought on by ambulation:    Pain in feet at night that wakes you up from your sleep:     Blood clot in your veins:    Leg swelling:         Pulmonary    Oxygen at home:    Productive cough:     Wheezing:         Neurologic    Sudden weakness in arms or legs:     Sudden numbness in arms or legs:     Sudden onset of difficulty speaking or slurred speech:    Temporary loss of vision in one eye:     Problems with dizziness:         Gastrointestinal    Blood in stool:     Vomited blood:         Genitourinary    Burning when urinating:     Blood in urine:        Psychiatric    Major depression:         Hematologic    Bleeding problems:    Problems with blood clotting too easily:        Skin    Rashes or ulcers:        Constitutional    Fever or chills:      PHYSICAL EXAMINATION:  There were no vitals filed for this visit.  General:  WDWN in NAD; vital signs documented above Gait: Not observed HENT: WNL, normocephalic Pulmonary: normal non-labored breathing , without Rales, rhonchi,  wheezing Cardiac: regular HR Abdomen: soft, NT, no masses Skin: without rashes Vascular Exam/Pulses:  Right Left  Radial 2+ (normal) 2+ (normal)                       Extremities: without ischemic changes, without Gangrene , without cellulitis; without open wounds;  Musculoskeletal: no muscle wasting or atrophy  Neurologic: A&O X 3;  No focal weakness or paresthesias are detected Psychiatric:  The pt has Normal affect.   Non-Invasive Vascular Imaging:   +-----------------+-------------+----------+--------------+  Right Cephalic   Diameter (cm)Depth (cm)   Findings     +-----------------+-------------+----------+--------------+  Prox upper arm       0.13        1.20                   +-----------------+-------------+----------+--------------+  Mid upper arm  not visualized   +-----------------+-------------+----------+--------------+  Dist upper arm                          not visualized  +-----------------+-------------+----------+--------------+  Antecubital fossa                       not visualized  +-----------------+-------------+----------+--------------+  Prox forearm                            not visualized  +-----------------+-------------+----------+--------------+  Mid forearm                             not visualized  +-----------------+-------------+----------+--------------+  Dist forearm         0.14        0.11                   +-----------------+-------------+----------+--------------+  Wrist               0.20        0.11                   +-----------------+-------------+----------+--------------+   +-----------------+-------------+----------+--------------+  Right Basilic    Diameter (cm)Depth (cm)   Findings     +-----------------+-------------+----------+--------------+  Prox upper arm       0.31        0.98                   +-----------------+-------------+----------+--------------+  Mid upper arm        0.22        1.10                   +-----------------+-------------+----------+--------------+  Dist upper arm       0.20        1.30                   +-----------------+-------------+----------+--------------+  Antecubital fossa    0.11        0.58                   +-----------------+-------------+----------+--------------+  Prox forearm                            not visualized  +-----------------+-------------+----------+--------------+  Mid forearm                             not visualized  +-----------------+-------------+----------+--------------+  Distal forearm                          not visualized  +-----------------+-------------+----------+--------------+  Wrist                                  not visualized   +-----------------+-------------+----------+--------------+   +-----------------+-------------+----------+----------------------+  Left Cephalic    Diameter (cm)Depth (cm)       Findings         +-----------------+-------------+----------+----------------------+  Prox upper arm       0.28        0.27                           +-----------------+-------------+----------+----------------------+  Mid upper arm        0.21        0.11   branching and Tortuous  +-----------------+-------------+----------+----------------------+  Dist upper arm       0.23        0.14                           +-----------------+-------------+----------+----------------------+  Antecubital fossa    0.23        0.26                           +-----------------+-------------+----------+----------------------+  Prox forearm         0.15        0.48                           +-----------------+-------------+----------+----------------------+  Mid forearm          0.18        0.44                           +-----------------+-------------+----------+----------------------+  Dist forearm         0.16        0.21                           +-----------------+-------------+----------+----------------------+  Wrist               0.13        0.13                           +-----------------+-------------+----------+----------------------+   +-----------------+-------------+----------+--------+  Left Basilic     Diameter (cm)Depth (cm)Findings  +-----------------+-------------+----------+--------+  Prox upper arm       0.31        0.77             +-----------------+-------------+----------+--------+  Mid upper arm        0.27        1.00             +-----------------+-------------+----------+--------+  Dist upper arm       0.20        1.20             +-----------------+-------------+----------+--------+  Antecubital fossa    0.20        0.17              +-----------------+-------------+----------+--------+  Prox forearm         0.17        0.14             +-----------------+-------------+----------+--------+  Mid forearm          0.14        0.12             +-----------------+-------------+----------+--------+  Distal forearm       0.11        0.14             +-----------------+-------------+----------+--------+  Wrist               0.13        0.12             +-----------------+-------------+----------+--------+     ASSESSMENT/PLAN:  [name] is a 68 y.o. female  who presents with chronic kidney disease stage 4  Based on vein mapping and examination, she has small superficial veins bilaterally.  The left arm has a possible basilic vein which may be used for access if it dilates after block.  At this time however, I think her best dialysis access option would be right sided AV graft, especially as she is left-handed. Notes from her referral asked to hold off on graft placement.  She can follow-up as needed at this time. In the interim, I asked her to do left-sided arm exercises in hopes to dilate a usable basilic vein.  I think the left sided cephalic vein is small and likely severely sclerotic from multiple accesses.  Victorino Sparrow, MD [facility] [phone]

## 2023-11-18 ENCOUNTER — Telehealth (HOSPITAL_COMMUNITY): Payer: Self-pay

## 2023-11-18 ENCOUNTER — Ambulatory Visit: Payer: Medicare Other

## 2023-11-18 VITALS — BP 108/67 | HR 84 | Temp 97.7°F | Resp 18 | Ht 63.0 in | Wt 190.0 lb

## 2023-11-18 DIAGNOSIS — E860 Dehydration: Secondary | ICD-10-CM

## 2023-11-18 MED ORDER — SODIUM CHLORIDE 0.9 % IV BOLUS
1000.0000 mL | Freq: Once | INTRAVENOUS | Status: AC
  Administered 2023-11-18: 1000 mL via INTRAVENOUS
  Filled 2023-11-18: qty 1000

## 2023-11-18 MED ORDER — HEPARIN SOD (PORK) LOCK FLUSH 100 UNIT/ML IV SOLN
500.0000 [IU] | Freq: Once | INTRAVENOUS | Status: DC | PRN
Start: 2023-11-18 — End: 2023-11-18

## 2023-11-18 MED ORDER — HEPARIN SOD (PORK) LOCK FLUSH 100 UNIT/ML IV SOLN
250.0000 [IU] | Freq: Once | INTRAVENOUS | Status: DC | PRN
Start: 2023-11-18 — End: 2023-11-18

## 2023-11-18 NOTE — Telephone Encounter (Signed)
 Pt called to get scheduled for a port evaluation. I told her that we would need an order before scheduling. I reached out to [facility] about an order, they will check and fax over the order. AB

## 2023-11-18 NOTE — Progress Notes (Signed)
 Diagnosis: Dehydration  Provider:  Chilton Greathouse MD  Procedure: IV Infusion  Tried to access port ,but unable to get blood return.Pt contacted radiology for further evaluation.  IV Type: Peripheral, IV Location: R Hand   Normal Saline, Dose: 1000 ml  Infusion Start Time: 0840  Infusion Stop Time: 0950  Post Infusion IV Care: Peripheral IV Discontinued  Discharge: Condition: Good, Destination: Home . AVS Declined  Performed by:  Garnette Czech, RN

## 2023-11-21 ENCOUNTER — Ambulatory Visit (INDEPENDENT_AMBULATORY_CARE_PROVIDER_SITE_OTHER): Payer: Medicare Other

## 2023-11-21 VITALS — BP 149/83 | HR 73 | Temp 97.9°F | Resp 16 | Ht 63.0 in | Wt 188.0 lb

## 2023-11-21 DIAGNOSIS — N390 Urinary tract infection, site not specified: Secondary | ICD-10-CM | POA: Diagnosis not present

## 2023-11-21 DIAGNOSIS — E860 Dehydration: Secondary | ICD-10-CM

## 2023-11-21 MED ORDER — HEPARIN SOD (PORK) LOCK FLUSH 100 UNIT/ML IV SOLN
250.0000 [IU] | Freq: Once | INTRAVENOUS | Status: DC | PRN
Start: 2023-11-21 — End: 2023-11-21

## 2023-11-21 MED ORDER — HEPARIN SOD (PORK) LOCK FLUSH 100 UNIT/ML IV SOLN
500.0000 [IU] | Freq: Once | INTRAVENOUS | Status: AC | PRN
  Administered 2023-11-21: 500 [IU]
  Filled 2023-11-21: qty 5

## 2023-11-21 MED ORDER — SODIUM CHLORIDE 0.9 % IV BOLUS
1000.0000 mL | Freq: Once | INTRAVENOUS | Status: AC
  Administered 2023-11-21: 1000 mL via INTRAVENOUS
  Filled 2023-11-21: qty 1000

## 2023-11-21 NOTE — Progress Notes (Signed)
 Diagnosis: Dehydration  Provider:  Chilton Greathouse MD  Procedure: IV Infusion  IV Type: Port a Cath, IV Location: R Chest  Normal Saline, Dose: 1000 ml  Infusion Start Time: 0828  Infusion Stop Time: 0939  Post Infusion IV Care: Port a Cath Deaccessed/Flushed  Discharge: Condition: Good, Destination: Home . AVS Declined  Performed by:  Nat Math, RN

## 2023-11-21 NOTE — Telephone Encounter (Signed)
 Called and spoke to patient. She says she is doing better and is having thicker stool.  She is taking 2 tablets of Lomotil TID. She says she hasn't been able to get the 4th dose in. She currently is experiencing what she thinks is a bladder infection or UTI and is being seen by Dr. Arrie Aran.  She will let us know if anything changes but for now she feels she is doing better.

## 2023-11-21 NOTE — Telephone Encounter (Signed)
 Good to hear it, thanks for the update.

## 2023-11-23 ENCOUNTER — Ambulatory Visit: Payer: Medicare Other

## 2023-11-23 VITALS — BP 132/75 | HR 82 | Temp 97.6°F | Resp 16 | Ht 63.0 in | Wt 189.0 lb

## 2023-11-23 DIAGNOSIS — E860 Dehydration: Secondary | ICD-10-CM | POA: Diagnosis not present

## 2023-11-23 MED ORDER — HEPARIN SOD (PORK) LOCK FLUSH 100 UNIT/ML IV SOLN
500.0000 [IU] | Freq: Once | INTRAVENOUS | Status: AC | PRN
  Administered 2023-11-23: 500 [IU]
  Filled 2023-11-23: qty 5

## 2023-11-23 MED ORDER — SODIUM CHLORIDE 0.9 % IV BOLUS
1000.0000 mL | Freq: Once | INTRAVENOUS | Status: AC
  Administered 2023-11-23: 1000 mL via INTRAVENOUS
  Filled 2023-11-23: qty 1000

## 2023-11-23 NOTE — Progress Notes (Signed)
 Diagnosis: Dehydration  Provider:  Chilton Greathouse MD  Procedure: IV Infusion  IV Type: Port a Cath, IV Location: R Chest  Normal Saline, Dose: 1000 ml  Infusion Start Time: 0833  Infusion Stop Time: 0937  Post Infusion IV Care: Patient declined observation and Port a Cath Deaccessed/Flushed and heparin locked  Discharge: Condition: Stable, Destination: Home . AVS Declined  Performed by:  Wyvonne Lenz, RN

## 2023-11-25 ENCOUNTER — Ambulatory Visit: Payer: Medicare Other

## 2023-11-25 VITALS — BP 137/76 | HR 89 | Temp 97.9°F | Resp 20 | Ht 63.0 in | Wt 189.4 lb

## 2023-11-25 DIAGNOSIS — E860 Dehydration: Secondary | ICD-10-CM

## 2023-11-25 MED ORDER — SODIUM CHLORIDE 0.9 % IV BOLUS
1000.0000 mL | Freq: Once | INTRAVENOUS | Status: AC
  Administered 2023-11-25: 1000 mL via INTRAVENOUS
  Filled 2023-11-25: qty 1000

## 2023-11-25 MED ORDER — HEPARIN SOD (PORK) LOCK FLUSH 100 UNIT/ML IV SOLN
500.0000 [IU] | Freq: Once | INTRAVENOUS | Status: AC | PRN
Start: 2023-11-25 — End: 2023-11-25
  Administered 2023-11-25: 500 [IU]
  Filled 2023-11-25: qty 5

## 2023-11-25 NOTE — Progress Notes (Signed)
 Diagnosis: Dehydration  Provider:  Chilton Greathouse MD  Procedure: IV Infusion  IV Type: Port a Cath, IV Location: R Chest  Normal Saline, Dose: 1000 ml  Infusion Start Time: 0835  Infusion Stop Time: 0949  Post Infusion IV Care: Port a Cath Deaccessed/Flushed  Discharge: Condition: Good, Destination: Home . AVS Declined  Performed by:  Adriana Mccallum, RN

## 2023-11-28 ENCOUNTER — Ambulatory Visit (INDEPENDENT_AMBULATORY_CARE_PROVIDER_SITE_OTHER)

## 2023-11-28 VITALS — BP 160/87 | HR 112 | Temp 97.9°F | Resp 14 | Ht 63.0 in | Wt 186.6 lb

## 2023-11-28 DIAGNOSIS — E860 Dehydration: Secondary | ICD-10-CM

## 2023-11-28 MED ORDER — SODIUM CHLORIDE 0.9 % IV BOLUS
1000.0000 mL | Freq: Once | INTRAVENOUS | Status: AC
  Administered 2023-11-28: 1000 mL via INTRAVENOUS
  Filled 2023-11-28: qty 1000

## 2023-11-28 MED ORDER — HEPARIN SOD (PORK) LOCK FLUSH 100 UNIT/ML IV SOLN
500.0000 [IU] | Freq: Once | INTRAVENOUS | Status: AC | PRN
  Administered 2023-11-28: 500 [IU]
  Filled 2023-11-28: qty 5

## 2023-11-28 NOTE — Progress Notes (Signed)
 Diagnosis: Dehydration  Provider:  Chilton Greathouse MD  Procedure: IV Infusion  IV Type: Port a Cath, IV Location: R Chest  Normal Saline, Dose: 1000 ml  Infusion Start Time: 0836  Infusion Stop Time: 0944  Post Infusion IV Care: Port a Cath Deaccessed/Flushed  Discharge: Condition: Good, Destination: Home . AVS Declined  Performed by:  Wyvonne Lenz, RN

## 2023-11-29 ENCOUNTER — Encounter: Payer: Self-pay | Admitting: Family

## 2023-11-29 ENCOUNTER — Encounter: Payer: Self-pay | Admitting: Nephrology

## 2023-11-29 ENCOUNTER — Other Ambulatory Visit (HOSPITAL_COMMUNITY): Payer: Self-pay | Admitting: Nephrology

## 2023-11-29 ENCOUNTER — Encounter: Payer: Self-pay | Admitting: Gastroenterology

## 2023-11-29 DIAGNOSIS — N179 Acute kidney failure, unspecified: Secondary | ICD-10-CM

## 2023-11-30 ENCOUNTER — Ambulatory Visit

## 2023-11-30 ENCOUNTER — Other Ambulatory Visit: Payer: Self-pay

## 2023-11-30 VITALS — BP 134/74 | HR 80 | Temp 97.8°F | Resp 18 | Ht 63.0 in | Wt 190.0 lb

## 2023-11-30 DIAGNOSIS — E860 Dehydration: Secondary | ICD-10-CM

## 2023-11-30 MED ORDER — SODIUM CHLORIDE 0.9 % IV BOLUS
1000.0000 mL | Freq: Once | INTRAVENOUS | Status: AC
  Administered 2023-11-30: 1000 mL via INTRAVENOUS
  Filled 2023-11-30: qty 1000

## 2023-11-30 MED ORDER — HEPARIN SOD (PORK) LOCK FLUSH 100 UNIT/ML IV SOLN: 250.0000 [IU] | Freq: Once | INTRAVENOUS | Status: DC | PRN

## 2023-11-30 MED ORDER — SODIUM CHLORIDE 0.9 % IV BOLUS
1000.0000 mL | Freq: Once | INTRAVENOUS | Status: DC
Start: 2023-11-30 — End: 2023-11-30

## 2023-11-30 MED ORDER — HEPARIN SOD (PORK) LOCK FLUSH 100 UNIT/ML IV SOLN: 500.0000 [IU] | Freq: Once | INTRAVENOUS | Status: DC | PRN

## 2023-11-30 NOTE — Progress Notes (Signed)
 Pharmacy Patient Advocate Encounter  Insurance verification completed.   The patient is insured through [employer] test claim for Octreotide. Co-pay is $1,209.99.  This test claim was processed through [facility]- copay amounts may vary at other pharmacies due to pharmacy/plan contracts, or as the patient moves through the different stages of their insurance plan.   Patient eligible for Medicare payment plan.   [name] at infusion center is going to look into assistance for patient and will let me know if they need to order medication from [place].

## 2023-11-30 NOTE — Progress Notes (Signed)
 Diagnosis: Dehydration  Provider:  Chilton Greathouse MD  Procedure: IV Infusion  IV Type: Peripheral, IV Location: L Forearm  Normal Saline, Dose: 1000 ml  Infusion Start Time: 0836  Infusion Stop Time: 0944  Post Infusion IV Care: Peripheral IV Discontinued  Discharge: Condition: Good, Destination: Home . AVS Declined  Performed by:  Garnette Czech, RN

## 2023-12-01 ENCOUNTER — Other Ambulatory Visit: Payer: Self-pay

## 2023-12-01 NOTE — Progress Notes (Signed)
 Spoke with [name] at infusion center- due to the price they are going to do buy/bill for her. Dis-enrolling. Advised her to let us know If they need [place] to fill in the future.

## 2023-12-02 ENCOUNTER — Ambulatory Visit

## 2023-12-02 ENCOUNTER — Other Ambulatory Visit: Payer: Self-pay

## 2023-12-02 VITALS — BP 134/80 | HR 83 | Temp 97.7°F | Resp 20 | Ht 63.0 in | Wt 187.4 lb

## 2023-12-02 DIAGNOSIS — K50819 Crohn's disease of both small and large intestine with unspecified complications: Secondary | ICD-10-CM | POA: Diagnosis not present

## 2023-12-02 DIAGNOSIS — Z932 Ileostomy status: Secondary | ICD-10-CM

## 2023-12-02 DIAGNOSIS — R198 Other specified symptoms and signs involving the digestive system and abdomen: Secondary | ICD-10-CM

## 2023-12-02 DIAGNOSIS — E860 Dehydration: Secondary | ICD-10-CM

## 2023-12-02 MED ORDER — OCTREOTIDE ACETATE 20 MG IM KIT
20.0000 mg | PACK | Freq: Once | INTRAMUSCULAR | Status: AC
  Administered 2023-12-02: 20 mg via INTRAMUSCULAR
  Filled 2023-12-02: qty 1

## 2023-12-02 MED ORDER — HEPARIN SOD (PORK) LOCK FLUSH 100 UNIT/ML IV SOLN: 250.0000 [IU] | Freq: Once | INTRAVENOUS | Status: DC | PRN

## 2023-12-02 MED ORDER — SODIUM CHLORIDE 0.9 % IV BOLUS
1000.0000 mL | Freq: Once | INTRAVENOUS | Status: AC
  Administered 2023-12-02: 1000 mL via INTRAVENOUS
  Filled 2023-12-02: qty 1000

## 2023-12-02 MED ORDER — HEPARIN SOD (PORK) LOCK FLUSH 100 UNIT/ML IV SOLN
500.0000 [IU] | Freq: Once | INTRAVENOUS | Status: AC | PRN
Start: 2023-12-02 — End: 2023-12-02
  Administered 2023-12-02: 500 [IU]
  Filled 2023-12-02: qty 5

## 2023-12-02 NOTE — Progress Notes (Signed)
 Diagnosis: Dehydration  Provider:  Chilton Greathouse MD  Procedure: IV Infusion  IV Type: Port a Cath, IV Location: R Chest  Normal Saline, Dose: 1000 ml  Infusion Start Time: 0824  Infusion Stop Time: 0930  Post Infusion IV Care: Port a Cath Deaccessed/Flushed  Diagnosis: Crohn's Disease  Provider:  Chilton Greathouse MD  Procedure: Injection  Octreotide, Dose: 20 mg, Site: intramuscular, Number of injections: 1  Injection Site(s): Left upper quad. gluteus   Discharge: Condition: Good, Destination: Home . AVS Provided  Performed by:  Nat Math, RN

## 2023-12-05 ENCOUNTER — Ambulatory Visit (INDEPENDENT_AMBULATORY_CARE_PROVIDER_SITE_OTHER)

## 2023-12-05 ENCOUNTER — Ambulatory Visit (HOSPITAL_COMMUNITY)
Admission: RE | Admit: 2023-12-05 | Discharge: 2023-12-05 | Disposition: A | Discharge status: Home / Self Care | Source: Ambulatory Visit | Attending: Nephrology | Admitting: Nephrology

## 2023-12-05 ENCOUNTER — Other Ambulatory Visit: Payer: Self-pay | Admitting: Family Medicine

## 2023-12-05 VITALS — BP 130/69 | HR 90 | Temp 97.8°F | Resp 16 | Ht 63.0 in | Wt 186.2 lb

## 2023-12-05 DIAGNOSIS — E860 Dehydration: Secondary | ICD-10-CM | POA: Diagnosis not present

## 2023-12-05 DIAGNOSIS — Z452 Encounter for adjustment and management of vascular access device: Secondary | ICD-10-CM | POA: Insufficient documentation

## 2023-12-05 DIAGNOSIS — N179 Acute kidney failure, unspecified: Secondary | ICD-10-CM | POA: Insufficient documentation

## 2023-12-05 DIAGNOSIS — T82598A Other mechanical complication of other cardiac and vascular devices and implants, initial encounter: Secondary | ICD-10-CM | POA: Diagnosis not present

## 2023-12-05 HISTORY — PX: IR CV LINE INJECTION: IMG2294

## 2023-12-05 MED ORDER — HEPARIN SOD (PORK) LOCK FLUSH 100 UNIT/ML IV SOLN: 250.0000 [IU] | Freq: Once | INTRAVENOUS | Status: DC | PRN

## 2023-12-05 MED ORDER — SODIUM CHLORIDE 0.9 % IV BOLUS
1000.0000 mL | Freq: Once | INTRAVENOUS | Status: AC
  Administered 2023-12-05: 1000 mL via INTRAVENOUS
  Filled 2023-12-05: qty 1000

## 2023-12-05 MED ORDER — HEPARIN SOD (PORK) LOCK FLUSH 100 UNIT/ML IV SOLN
INTRAVENOUS | Status: AC
  Filled 2023-12-05: qty 5

## 2023-12-05 MED ORDER — ALTEPLASE 2 MG IJ SOLR
INTRAMUSCULAR | Status: AC
  Filled 2023-12-05: qty 2

## 2023-12-05 MED ORDER — ALTEPLASE 2 MG IJ SOLR: 2.0000 mg | Freq: Once | INTRAMUSCULAR | Status: DC

## 2023-12-05 MED ORDER — HEPARIN SOD (PORK) LOCK FLUSH 100 UNIT/ML IV SOLN
500.0000 [IU] | Freq: Once | INTRAVENOUS | Status: AC | PRN
  Administered 2023-12-05: 500 [IU]
  Filled 2023-12-05: qty 5

## 2023-12-05 NOTE — Progress Notes (Signed)
 Diagnosis: Dehydration  Provider:  Chilton Greathouse MD  Procedure: IV Infusion  IV Type: Port a Cath, IV Location: R Chest  Normal Saline, Dose: 1000 ml  Infusion Start Time: 0839  Infusion Stop Time: 0948  Post Infusion IV Care: Patient declined observation and Port a Cath Deaccessed/Flushed and heparin locked  Discharge: Condition: Stable, Destination: Home . AVS Declined  Performed by:  Wyvonne Lenz, RN

## 2023-12-07 ENCOUNTER — Ambulatory Visit

## 2023-12-07 VITALS — BP 185/87 | HR 85 | Temp 97.8°F | Resp 20 | Ht 63.0 in | Wt 188.0 lb

## 2023-12-07 DIAGNOSIS — E860 Dehydration: Secondary | ICD-10-CM

## 2023-12-07 MED ORDER — HEPARIN SOD (PORK) LOCK FLUSH 100 UNIT/ML IV SOLN: 500.0000 [IU] | Freq: Once | INTRAVENOUS | Status: DC | PRN

## 2023-12-07 MED ORDER — ALTEPLASE 2 MG IJ SOLR
2.0000 mg | Freq: Once | INTRAMUSCULAR | Status: AC | PRN
  Administered 2023-12-07: 2 mg
  Filled 2023-12-07: qty 2

## 2023-12-07 MED ORDER — SODIUM CHLORIDE 0.9 % IV BOLUS
1000.0000 mL | Freq: Once | INTRAVENOUS | Status: AC
  Administered 2023-12-07: 1000 mL via INTRAVENOUS
  Filled 2023-12-07: qty 1000

## 2023-12-07 NOTE — Progress Notes (Signed)
 Diagnosis: Dehydration  Provider:  Chilton Greathouse MD  Procedure: IV Infusion  IV Type: Peripheral, IV Location: L Forearm  Normal Saline, Dose: 1000 ml  Infusion Start Time: 0842  Infusion Stop Time: 0956  Post Infusion IV Care: Patient declined observation, Peripheral IV Discontinued, and Port a Cath Deaccessed/Flushed  Discharge: Condition: Good, Destination: Home . AVS Declined  Performed by:  Wyvonne Lenz, RN    Port accessed. Flushed, but unable to obtain blood return. Cathflo instilled. Blood return obtained, but sluggish after 30 minute dwell. Cathflo allowed to dwell an additional 30 minutes. Blood return still slow.

## 2023-12-09 ENCOUNTER — Ambulatory Visit

## 2023-12-09 VITALS — BP 134/80 | HR 79 | Temp 97.5°F | Resp 18 | Ht 63.0 in | Wt 189.2 lb

## 2023-12-09 DIAGNOSIS — E860 Dehydration: Secondary | ICD-10-CM

## 2023-12-09 MED ORDER — SODIUM CHLORIDE 0.9 % IV BOLUS
1000.0000 mL | Freq: Once | INTRAVENOUS | Status: AC
  Administered 2023-12-09: 1000 mL via INTRAVENOUS
  Filled 2023-12-09: qty 1000

## 2023-12-09 NOTE — Progress Notes (Signed)
 Diagnosis: Dehydration  Provider:  Chilton Greathouse MD  Procedure: IV Infusion  IV Type: Peripheral, IV Location: L Forearm  Normal Saline, Dose: 1000 ml  Infusion Start Time: 0850  Infusion Stop Time: 0958  Post Infusion IV Care: Peripheral IV Discontinued  Discharge: Condition: Good, Destination: Home . AVS Declined  Performed by:  Garnette Czech, RN

## 2023-12-12 ENCOUNTER — Telehealth: Payer: Self-pay

## 2023-12-12 ENCOUNTER — Ambulatory Visit (INDEPENDENT_AMBULATORY_CARE_PROVIDER_SITE_OTHER)

## 2023-12-12 VITALS — BP 146/93 | HR 85 | Temp 97.6°F | Resp 20 | Ht 63.0 in | Wt 188.0 lb

## 2023-12-12 DIAGNOSIS — E860 Dehydration: Secondary | ICD-10-CM

## 2023-12-12 MED ORDER — SODIUM CHLORIDE 0.9% FLUSH: 3.0000 mL | Freq: Once | INTRAVENOUS | Status: DC | PRN

## 2023-12-12 MED ORDER — SODIUM CHLORIDE 0.9 % IV BOLUS
1000.0000 mL | Freq: Once | INTRAVENOUS | Status: AC
  Administered 2023-12-12: 1000 mL via INTRAVENOUS
  Filled 2023-12-12: qty 1000

## 2023-12-12 MED ORDER — HEPARIN SOD (PORK) LOCK FLUSH 100 UNIT/ML IV SOLN: 500.0000 [IU] | Freq: Once | INTRAVENOUS | Status: DC | PRN

## 2023-12-12 MED ORDER — ANTICOAGULANT SODIUM CITRATE 4% (200MG/5ML) IV SOLN: 5.0000 mL | Freq: Once | Status: DC | PRN

## 2023-12-12 MED ORDER — HEPARIN SOD (PORK) LOCK FLUSH 100 UNIT/ML IV SOLN
250.0000 [IU] | Freq: Once | INTRAVENOUS | Status: DC | PRN
Start: 2023-12-12 — End: 2023-12-12

## 2023-12-12 MED ORDER — ALTEPLASE 2 MG IJ SOLR
2.0000 mg | Freq: Once | INTRAMUSCULAR | Status: AC | PRN
  Administered 2023-12-12: 2 mg
  Filled 2023-12-12: qty 2

## 2023-12-12 MED ORDER — SODIUM CHLORIDE 0.9% FLUSH
10.0000 mL | Freq: Once | INTRAVENOUS | Status: DC | PRN
Start: 2023-12-12 — End: 2023-12-12

## 2023-12-12 NOTE — Telephone Encounter (Signed)
 Called Dr. Hadley Pen office [facility], [phone]). Left voicemail for Dr. Rosaland Lao nurse, [place] [phone]). Relayed that we have had difficulty obtaining blood return on patient's port, and administered Cathflo once last week and again today, with no blood return obtained today. Requesting advice from provider on how to proceed.   Wyvonne Lenz, RN

## 2023-12-12 NOTE — Progress Notes (Signed)
 Diagnosis: Dehydration  Provider:  Chilton Greathouse MD  Procedure: IV Infusion  IV Type: Peripheral, IV Location: L Forearm  Normal Saline, Dose: 1000 ml  Infusion Start Time: 0846  Infusion Stop Time: 1000  Port (right chest) accessed. Flushed easily but blood return not obtained. Cathflo instilled and allowed to dwell for 30 minutes. No blood return obtained. Cathflo allowed to dwell for an additional 30 minutes. No blood return obtained.  Post Infusion IV Care: PIV discontinued. Observation completed. Port a cath [employer].  Discharge: Condition: Good, Destination: Home . AVS Declined  Performed by:  Wyvonne Lenz, RN

## 2023-12-13 ENCOUNTER — Ambulatory Visit: Payer: Medicare Other | Admitting: Gastroenterology

## 2023-12-14 ENCOUNTER — Other Ambulatory Visit (HOSPITAL_COMMUNITY): Payer: Self-pay | Admitting: Interventional Radiology

## 2023-12-14 ENCOUNTER — Telehealth (HOSPITAL_COMMUNITY): Payer: Self-pay

## 2023-12-14 ENCOUNTER — Ambulatory Visit

## 2023-12-14 ENCOUNTER — Encounter (HOSPITAL_COMMUNITY): Payer: Self-pay | Admitting: Interventional Radiology

## 2023-12-14 ENCOUNTER — Other Ambulatory Visit (HOSPITAL_COMMUNITY): Payer: Self-pay | Admitting: Student

## 2023-12-14 VITALS — BP 153/89 | HR 87 | Temp 97.7°F | Resp 18 | Ht 63.0 in | Wt 187.2 lb

## 2023-12-14 DIAGNOSIS — E860 Dehydration: Secondary | ICD-10-CM

## 2023-12-14 DIAGNOSIS — Z95828 Presence of other vascular implants and grafts: Secondary | ICD-10-CM

## 2023-12-14 MED ORDER — HEPARIN SOD (PORK) LOCK FLUSH 100 UNIT/ML IV SOLN: 500.0000 [IU] | Freq: Once | INTRAVENOUS | Status: DC | PRN

## 2023-12-14 MED ORDER — HEPARIN SOD (PORK) LOCK FLUSH 100 UNIT/ML IV SOLN: 250.0000 [IU] | Freq: Once | INTRAVENOUS | Status: DC | PRN

## 2023-12-14 MED ORDER — SODIUM CHLORIDE 0.9 % IV BOLUS
1000.0000 mL | Freq: Once | INTRAVENOUS | Status: AC
  Administered 2023-12-14: 1000 mL via INTRAVENOUS
  Filled 2023-12-14: qty 1000

## 2023-12-14 NOTE — Telephone Encounter (Signed)
-----   Message from Durwin Glaze sent at 12/14/2023  9:28 AM EDT ----- Regarding: RE: Port malfunction At this point I would just set her up for port revision (sedation) Thx DDH ----- Message ----- From: Sharee Pimple Sent: 12/14/2023   9:19 AM EDT To: Ir Procedure Requests Subject: Port malfunction                               Morning,   Pt's nurse just called because they aren't able to use her port again. They have tried cath flow twice. They are able to flush but they get no blood return. Should I get her in for another port evaluation?  I'm sending this for review because this will be the 4th time the patient comes for a port eval. Want to make sure she doesn't need anything else done.   Please review.   Thanks,  Fara Boros

## 2023-12-14 NOTE — Telephone Encounter (Signed)
 Called and left second voicemail for [place], RN, requesting call back regarding unreliable blood return from patient's port. Patient receiving fluid infusion today via PIV. Patient states she called [facility] radiology and has an appointment on Friday for port evaluation.  Wyvonne Lenz, RN

## 2023-12-14 NOTE — Progress Notes (Signed)
 Diagnosis: Dehydration  Provider:  Chilton Greathouse MD  Procedure: IV Infusion  IV Type: Peripheral, IV Location: R Hand  Normal Saline, Dose: 1000 ml  Infusion Start Time: 4098  Infusion Stop Time: 0952  Post Infusion IV Care: Peripheral IV Discontinued  Discharge: Condition: Good, Destination: Home . AVS Declined  Performed by:  Garnette Czech, RN

## 2023-12-15 NOTE — Telephone Encounter (Signed)
 Spoke with [place], Dr. Hadley Pen Nurse at [facility]. States Dr. Arrie Aran recommends ensuring port has been flushed prior to lab draws, and if issues continue to send the patient to IR for re-evaluation. Relayed that port has been flushed at appointments, and that patient is scheduled with IR tomorrow (12/16/23). Cherese acknowledged and all questions answered.  Wyvonne Lenz, RN

## 2023-12-16 ENCOUNTER — Ambulatory Visit

## 2023-12-16 ENCOUNTER — Ambulatory Visit (HOSPITAL_COMMUNITY): Admission: RE | Admit: 2023-12-16 | Source: Ambulatory Visit

## 2023-12-16 VITALS — BP 154/89 | HR 89 | Temp 97.9°F | Resp 18 | Ht 63.0 in | Wt 190.4 lb

## 2023-12-16 DIAGNOSIS — E860 Dehydration: Secondary | ICD-10-CM | POA: Diagnosis not present

## 2023-12-16 MED ORDER — HEPARIN SOD (PORK) LOCK FLUSH 100 UNIT/ML IV SOLN
500.0000 [IU] | Freq: Once | INTRAVENOUS | Status: DC | PRN
Start: 2023-12-16 — End: 2023-12-16

## 2023-12-16 MED ORDER — SODIUM CHLORIDE 0.9 % IV BOLUS
1000.0000 mL | Freq: Once | INTRAVENOUS | Status: AC
  Administered 2023-12-16: 1000 mL via INTRAVENOUS
  Filled 2023-12-16: qty 1000

## 2023-12-16 NOTE — Progress Notes (Signed)
 Diagnosis: Dehydration  Provider:  Chilton Greathouse MD  Procedure: IV Infusion  IV Type: Peripheral, IV Location: R Forearm  Normal Saline, Dose: 1000 ml  Infusion Start Time: 0824  Infusion Stop Time: 0936  Post Infusion IV Care: Peripheral IV Discontinued  Discharge: Condition: Good, Destination: Home . AVS Declined  Performed by:  Adriana Mccallum, RN

## 2023-12-19 ENCOUNTER — Ambulatory Visit

## 2023-12-19 VITALS — BP 137/84 | HR 83 | Temp 97.6°F | Resp 18 | Ht 63.0 in | Wt 188.6 lb

## 2023-12-19 DIAGNOSIS — E860 Dehydration: Secondary | ICD-10-CM | POA: Diagnosis not present

## 2023-12-19 MED ORDER — HEPARIN SOD (PORK) LOCK FLUSH 100 UNIT/ML IV SOLN: 250.0000 [IU] | Freq: Once | INTRAVENOUS | Status: DC | PRN

## 2023-12-19 MED ORDER — HEPARIN SOD (PORK) LOCK FLUSH 100 UNIT/ML IV SOLN: 500.0000 [IU] | Freq: Once | INTRAVENOUS | Status: DC | PRN

## 2023-12-19 MED ORDER — SODIUM CHLORIDE 0.9 % IV BOLUS
1000.0000 mL | Freq: Once | INTRAVENOUS | Status: AC
  Administered 2023-12-19: 1000 mL via INTRAVENOUS
  Filled 2023-12-19: qty 1000

## 2023-12-19 NOTE — Progress Notes (Signed)
 Diagnosis: Dehydration  Provider:  Mannam, Praveen MD  Procedure: IV Infusion  IV Type: Peripheral, IV Location: L Forearm  Normal Saline, Dose: 1000 ml  Infusion Start Time: 0831  Infusion Stop Time: 0938  Post Infusion IV Care: Patient declined observation and Peripheral IV Discontinued  Discharge: Condition: Stable, Destination: Home . AVS Declined  Performed by:  Lendel Quant, RN

## 2023-12-21 ENCOUNTER — Other Ambulatory Visit: Payer: Self-pay | Admitting: Student

## 2023-12-21 ENCOUNTER — Other Ambulatory Visit: Payer: Self-pay | Admitting: Radiology

## 2023-12-21 ENCOUNTER — Ambulatory Visit

## 2023-12-21 VITALS — BP 126/84 | HR 91 | Temp 98.1°F | Resp 20 | Ht 63.0 in | Wt 186.4 lb

## 2023-12-21 DIAGNOSIS — E860 Dehydration: Secondary | ICD-10-CM | POA: Diagnosis not present

## 2023-12-21 DIAGNOSIS — Z95828 Presence of other vascular implants and grafts: Secondary | ICD-10-CM

## 2023-12-21 MED ORDER — SODIUM CHLORIDE 0.9 % IV BOLUS
1000.0000 mL | Freq: Once | INTRAVENOUS | Status: AC
  Administered 2023-12-21: 1000 mL via INTRAVENOUS
  Filled 2023-12-21: qty 1000

## 2023-12-21 MED ORDER — HEPARIN SOD (PORK) LOCK FLUSH 100 UNIT/ML IV SOLN: 250.0000 [IU] | Freq: Once | INTRAVENOUS | Status: DC | PRN

## 2023-12-21 MED ORDER — HEPARIN SOD (PORK) LOCK FLUSH 100 UNIT/ML IV SOLN
500.0000 [IU] | Freq: Once | INTRAVENOUS | Status: DC | PRN
Start: 2023-12-21 — End: 2023-12-21

## 2023-12-21 NOTE — Progress Notes (Signed)
 Diagnosis: Dehydration  Provider:  Praveen Mannam MD  Procedure: IV Infusion  IV Type: Peripheral, IV Location: L Forearm  Normal Saline, Dose: 1000 ml  Infusion Start Time: 0837  Infusion Stop Time: 0945  Post Infusion IV Care: Peripheral IV Discontinued  Discharge: Condition: Good, Destination: Home . AVS Declined  Performed by:  Shirly Dow, RN

## 2023-12-22 ENCOUNTER — Encounter (HOSPITAL_COMMUNITY): Payer: Self-pay

## 2023-12-22 ENCOUNTER — Other Ambulatory Visit (HOSPITAL_COMMUNITY): Payer: Self-pay | Admitting: Interventional Radiology

## 2023-12-22 ENCOUNTER — Ambulatory Visit: Admitting: *Deleted

## 2023-12-22 ENCOUNTER — Ambulatory Visit (HOSPITAL_COMMUNITY)
Admission: RE | Admit: 2023-12-22 | Discharge: 2023-12-22 | Disposition: A | Discharge status: Home / Self Care | Source: Ambulatory Visit | Attending: Interventional Radiology | Admitting: Interventional Radiology

## 2023-12-22 ENCOUNTER — Other Ambulatory Visit: Payer: Self-pay

## 2023-12-22 ENCOUNTER — Other Ambulatory Visit: Payer: Self-pay | Admitting: Student

## 2023-12-22 VITALS — BP 154/90 | HR 92 | Temp 97.7°F | Resp 16 | Ht 63.0 in | Wt 187.4 lb

## 2023-12-22 DIAGNOSIS — T82598A Other mechanical complication of other cardiac and vascular devices and implants, initial encounter: Secondary | ICD-10-CM | POA: Diagnosis not present

## 2023-12-22 DIAGNOSIS — Z95828 Presence of other vascular implants and grafts: Secondary | ICD-10-CM

## 2023-12-22 DIAGNOSIS — E860 Dehydration: Secondary | ICD-10-CM

## 2023-12-22 DIAGNOSIS — Z87891 Personal history of nicotine dependence: Secondary | ICD-10-CM | POA: Insufficient documentation

## 2023-12-22 DIAGNOSIS — N183 Chronic kidney disease, stage 3 unspecified: Secondary | ICD-10-CM | POA: Diagnosis not present

## 2023-12-22 DIAGNOSIS — Z452 Encounter for adjustment and management of vascular access device: Secondary | ICD-10-CM | POA: Diagnosis not present

## 2023-12-22 DIAGNOSIS — I129 Hypertensive chronic kidney disease with stage 1 through stage 4 chronic kidney disease, or unspecified chronic kidney disease: Secondary | ICD-10-CM | POA: Diagnosis not present

## 2023-12-22 HISTORY — PX: IR IMAGING GUIDED PORT INSERTION: IMG5740

## 2023-12-22 HISTORY — PX: IR REMOVAL TUN ACCESS W/ PORT W/O FL MOD SED: IMG2290

## 2023-12-22 MED ORDER — HEPARIN SOD (PORK) LOCK FLUSH 100 UNIT/ML IV SOLN: 250.0000 [IU] | Freq: Once | INTRAVENOUS | Status: DC | PRN

## 2023-12-22 MED ORDER — HEPARIN SOD (PORK) LOCK FLUSH 100 UNIT/ML IV SOLN
500.0000 [IU] | Freq: Once | INTRAVENOUS | Status: DC | PRN
Start: 2023-12-22 — End: 2023-12-22

## 2023-12-22 MED ORDER — DEXTROSE 5 % IV SOLN: 2.0000 g | Freq: Once | INTRAVENOUS | Status: DC

## 2023-12-22 MED ORDER — CEFAZOLIN SODIUM-DEXTROSE 2-4 GM/100ML-% IV SOLN: 2.0000 g | Freq: Once | INTRAVENOUS | Status: DC

## 2023-12-22 MED ORDER — LIDOCAINE HCL 1 % IJ SOLN
INTRAMUSCULAR | Status: AC
  Filled 2023-12-22: qty 20

## 2023-12-22 MED ORDER — SODIUM CHLORIDE 0.9 % IV BOLUS
1000.0000 mL | Freq: Once | INTRAVENOUS | Status: AC
  Administered 2023-12-22: 1000 mL via INTRAVENOUS
  Filled 2023-12-22: qty 1000

## 2023-12-22 MED ORDER — MIDAZOLAM HCL 2 MG/2ML IJ SOLN
INTRAMUSCULAR | Status: AC
  Filled 2023-12-22: qty 4

## 2023-12-22 MED ORDER — CEFAZOLIN SODIUM-DEXTROSE 2-4 GM/100ML-% IV SOLN
INTRAVENOUS | Status: AC | PRN
  Administered 2023-12-22: 2 g via INTRAVENOUS

## 2023-12-22 MED ORDER — CEFAZOLIN SODIUM-DEXTROSE 2-4 GM/100ML-% IV SOLN
INTRAVENOUS | Status: AC
  Filled 2023-12-22: qty 100

## 2023-12-22 MED ORDER — HEPARIN SOD (PORK) LOCK FLUSH 100 UNIT/ML IV SOLN
500.0000 [IU] | Freq: Once | INTRAVENOUS | Status: AC
  Administered 2023-12-22: 500 [IU] via INTRAVENOUS

## 2023-12-22 MED ORDER — MIDAZOLAM HCL 2 MG/2ML IJ SOLN
INTRAMUSCULAR | Status: AC | PRN
  Administered 2023-12-22 (×3): 1 mg via INTRAVENOUS

## 2023-12-22 MED ORDER — FENTANYL CITRATE (PF) 100 MCG/2ML IJ SOLN
INTRAMUSCULAR | Status: AC
Start: 2023-12-22 — End: ?
  Filled 2023-12-22: qty 4

## 2023-12-22 MED ORDER — LIDOCAINE HCL 1 % IJ SOLN
20.0000 mL | Freq: Once | INTRAMUSCULAR | Status: AC
  Administered 2023-12-22: 20 mL

## 2023-12-22 MED ORDER — HEPARIN SOD (PORK) LOCK FLUSH 100 UNIT/ML IV SOLN
INTRAVENOUS | Status: AC
  Filled 2023-12-22: qty 5

## 2023-12-22 MED ORDER — HEPARIN SOD (PORK) LOCK FLUSH 100 UNIT/ML IV SOLN
INTRAVENOUS | Status: AC | PRN
  Administered 2023-12-22: 500 [IU] via INTRAVENOUS

## 2023-12-22 MED ORDER — FENTANYL CITRATE (PF) 100 MCG/2ML IJ SOLN
INTRAMUSCULAR | Status: AC | PRN
  Administered 2023-12-22 (×2): 50 ug via INTRAVENOUS

## 2023-12-22 NOTE — H&P (Signed)
 Chief Complaint: Patient was seen in consultation today for poor venous access  Referring Physician(s): Dr. Terrial Rhodes  Supervising Physician: Irish Lack  Patient Status: [facility] - Out-pt  History of Present Illness: [name] is a 68 y.o. female with history of Port placement by Dr. Lowella Dandy 04/2023.  Since placement patient has had various instances of difficulty accessing her Port.  [name] returned to IR 05/30/23 with fibrin sheath requiring stripping. [name] was again formally evaluated for inability to access 08/02/23 at which time her reservoir was found to be flipped, however with manipulation it was able to be repositioned.  Most recently [name] was seen 12/05/23 and again underwent cathflow dwell with restoration of flow.  Unfortunately, since this time [name] has not been able to successfully use her Port.  In the meantime, [name] has continued with regular infusions with difficulty getting peripheral IV access with her appts.  [name] presents today for [facility] revision vs. Replacement.   [name] presents in her usual state of health today.  [name] did undergo infusion this AM for dehydration via peripheral IV. [name] is stable for procedure today.  [name] understands the goals with likelihood of Port replacement.   Past Medical History:  Diagnosis Date   Abdominal pain    Hx   Acute pyelonephritis 12/07/2020   Allergy    Anal stenosis    Anemia    Anxiety    Arthritis    Asthma    patient does not have inhaler   Blood in stool    Hx   Blood in urine    Hx   Blood transfusion without reported diagnosis    Cataract    CKD (chronic kidney disease) stage 3, GFR 30-59 ml/min (HCC)    Crohn's colitis, other complication (HCC)    De Quervain's tenosynovitis    Depression    Difficulty urinating    Hx   Easy bruising    Esophagitis    Fainting    History - resolved - due to dehydration   Fatigue    Hx   Fibroid    Hx   Gastric polyp    GERD (gastroesophageal reflux disease)     Hearing loss    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   Hemorrhoids, external    Hemorrhoids, internal    Herpes, genital    vaginal treated 07/05/12 and pt states is resolved   History of cervical dysplasia    History of small bowel obstruction    Hyperlipidemia    currently no meds   Hyperparathyroidism    Hypertension    Hypokalemia    Hx   Hypotension 06/29/2022   IBD (inflammatory bowel disease)    initially colectomy for suspected UC, 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   Obesity    Ovarian cyst    Pain in joint, pelvic region and thigh 12/16/2008   Centricity Description: HIP PAIN, LEFT  Qualifier: Diagnosis of   By: Janit Bern      Centricity Description: HIP PAIN  Qualifier: Diagnosis of   By: Janit Bern       Pain of right thumb 04/12/2018   Poor dental hygiene    Pulmonary nodule, right    right upper lobe   Rectal bleeding    Hx   Rectal pain    Hx   Renal insufficiency    CKD - stage 3  RLS (restless legs syndrome)    no meds   Sepsis secondary to UTI (HCC) 06/29/2022   Severe sepsis (HCC) 12/07/2020   Tooth infection 11/2016   right low   Ulcerative colitis    Visual disturbance    wears glasses   Weakness generalized    Hx - patient denies generalized weakness   Wears dentures    upper only    Past Surgical History:  Procedure Laterality Date   ANAL DILATION     BIOPSY  07/20/2022   Procedure: BIOPSY;  Surgeon: Asencion Blacksmith, MD;  Location: [facility] ENDOSCOPY;  Service: Gastroenterology;;   CERVICAL BIOPSY  W/ LOOP ELECTRODE EXCISION     CHOLECYSTECTOMY     COLONOSCOPY     [name]   fatty tumor removed from back     X 2   HEMORRHOID SURGERY     ILEOSCOPY N/A 07/20/2022   Procedure: ILEOSCOPY THROUGH STOMA;  Surgeon: Asencion Blacksmith, MD;  Location: [facility];  Service: Gastroenterology;  Laterality: N/A;   ILEOSTOMY CLOSURE     IR CV LINE INJECTION  05/30/2023   IR CV LINE  INJECTION  08/02/2023   IR CV LINE INJECTION  12/05/2023   IR IMAGING GUIDED PORT INSERTION  04/12/2023   RESTORATIVE PROCTOCOLECTOMY     with insertion of ileoanal J Pouch with loop ileostomy   SHOULDER ARTHROSCOPY WITH CAPSULORRHAPHY Left 06/14/2019   Procedure: LEFT SHOULDER ARTHRSCOPIC REPAIR OF BONY BANKART FRACTURE;  Surgeon: Sammye Cristal, MD;  Location: [facility] ORS;  Service: Orthopedics;  Laterality: Left;  REQUEST 90 MINUTE   SIGMOIDOSCOPY     TOTAL ABDOMINAL HYSTERECTOMY  1998   TAH/LSO   TUBAL LIGATION     UPPER GASTROINTESTINAL ENDOSCOPY     [name]    Allergies: Sulfa antibiotics, Morphine, and Sulfonamide derivatives  Medications: Prior to Admission medications   Medication Sig Start Date End Date Taking? Authorizing Provider  acetaminophen (TYLENOL) 500 MG tablet Take 1,000 mg by mouth every 6 (six) hours as needed (for pain/sleep).   Yes [provider]  allopurinol (ZYLOPRIM) 300 MG tablet TAKE 1/2 TABLET (150 mg total) BY MOUTH DAILY AT 9PM 12/05/23  Yes Lowne Chase, Yvonne R, DO  amLODipine (NORVASC) 5 MG tablet Take 5 mg by mouth daily. 10/13/23  Yes [provider]  Calcium Carbonate Antacid (TUMS EXTRA STRENGTH 750 PO) Take 1-2 tablets by mouth every 6 (six) hours as needed (CHEW for heartburn).   Yes [provider]  cephALEXin (KEFLEX) 500 MG capsule Take 1 capsule (500 mg total) by mouth 2 (two) times daily. 10/26/23  Yes Roel Clarity R, DO  dicyclomine (BENTYL) 10 MG capsule Take 1 capsule (10 mg total) by mouth every 8 (eight) hours as needed for spasms. 10/25/23  Yes Armbruster, Lendon Queen, MD  diphenoxylate-atropine (LOMOTIL) 2.5-0.025 MG tablet Take 2 tablets by mouth 4 (four) times daily. 10/25/23 02/22/24 Yes Armbruster, Lendon Queen, MD  DULoxetine (CYMBALTA) 60 MG capsule Take 2 capsules (120 mg total) by mouth daily. 02/16/23  Yes Estill Hemming, DO  folic acid (FOLVITE) 1 MG tablet Take 2 tablets (2 mg total) by mouth  daily. Patient taking differently: Take 1 mg by mouth daily. 05/26/17  Yes Ennever, Sherryll Donald, MD  GAS-X EXTRA STRENGTH 125 MG CAPS Take 125 mg by mouth at bedtime.   Yes [provider]  metoprolol succinate (TOPROL-XL) 100 MG 24 hr tablet Take 100 mg by mouth at bedtime.   Yes [provider]  octreotide (SANDOSTATIN LAR DEPOT) 20 MG injection Inject 20 mg into the muscle every 28 (twenty-eight) days. 11/14/23  Yes Armbruster, Lendon Queen, MD  OVER THE COUNTER MEDICATION Take 1 tablet by mouth See admin instructions. Nervive Nerve Health tablet- Take 1 tablet by mouth at bedtime   Yes [provider]  Vitamin D, Ergocalciferol, (DRISDOL) 1.25 MG (50000 UNIT) CAPS capsule Take 50,000 Units by mouth every 30 (thirty) days.   Yes [provider]  apixaban (ELIQUIS) 5 MG TABS tablet Take 1 tablet (5 mg total) by mouth 2 (two) times daily. 02/03/23 06/03/23  Hoyt Macleod, MD  Psyllium (METAMUCIL PREMIUM BLEND) 52.63 % POWD Take Daily 06/03/23   Armbruster, Lendon Queen, MD     Family History  Problem Relation Age of Onset   Hypertension Mother    Heart disease Mother        s/p pci   Ulcerative colitis Father    Hypertension Father    Heart attack Father    Diabetes Sister    Cancer Sister        uterine   Pulmonary fibrosis Sister    Cancer Maternal Uncle        LUNG   Ulcerative colitis Daughter    Liver disease Daughter    Irritable bowel syndrome Other        grandchildren   Colon cancer Neg Hx    Esophageal cancer Neg Hx    Stomach cancer Neg Hx    Rectal cancer Neg Hx     Social History   Socioeconomic History   Marital status: Divorced    Spouse name: Not on file   Number of children: Not on file   Years of education: Not on file   Highest education level: Not on file  Occupational History   Not on file  Tobacco Use   Smoking status: Former    Current packs/day: 0.00    Average packs/day: 1 pack/day for 4.0 years (4.0 ttl pk-yrs)    Types:  Cigarettes    Start date: 05/21/1975    Quit date: 05/21/1979    Years since quitting: 44.6   Smokeless tobacco: Never  Vaping Use   Vaping status: Never Used  Substance and Sexual Activity   Alcohol use: No    Alcohol/week: 0.0 standard drinks of alcohol   Drug use: No   Sexual activity: Yes    Birth control/protection: Post-menopausal    Comment: 1st intercourse 68 yo-Fewer than 5 partners  Other Topics Concern   Not on file  Social History Narrative   Not on file   Social Drivers of Health   Financial Resource Strain: Low Risk  (05/26/2022)   Overall Financial Resource Strain (CARDIA)    Difficulty of Paying Living Expenses: Not hard at all  Food Insecurity: No Food Insecurity (05/28/2023)   Hunger Vital Sign    Worried About Running Out of Food in the Last Year: Never true    Ran Out of Food in the Last Year: Never true  Transportation Needs: No Transportation Needs (05/28/2023)   PRAPARE - Administrator, Civil Service (Medical): No    Lack of Transportation (Non-Medical): No  Physical Activity: Inactive (05/26/2022)   Exercise Vital Sign    Days of Exercise per Week: 0 days    Minutes of Exercise per Session: 0 min  Stress: No Stress Concern Present (05/26/2022)   [employer] of Occupational Health - Occupational Stress Questionnaire    Feeling  of Stress : Not at all  Social Connections: Moderately Isolated (05/26/2022)   Social Connection and Isolation Panel [NHANES]    Frequency of Communication with Friends and Family: More than three times a week    Frequency of Social Gatherings with Friends and Family: More than three times a week    Attends Religious Services: Never    Database administrator or Organizations: No    Attends Banker Meetings: Never    Marital Status: Married     Review of Systems: A 12 point ROS discussed and pertinent positives are indicated in the HPI above.  All other systems are negative.  Review of Systems   Constitutional:  Negative for fatigue and fever.  Respiratory:  Negative for cough and shortness of breath.   Cardiovascular:  Negative for chest pain.  Gastrointestinal:  Negative for abdominal pain, nausea and vomiting.  Musculoskeletal:  Negative for back pain.  Psychiatric/Behavioral:  Negative for behavioral problems and confusion.     Vital Signs: BP (!) 163/96   Pulse 86   Temp 98.2 F (36.8 C) (Oral)   Resp 18   Ht 5\' 3"  (1.6 m)   Wt 187 lb (84.8 kg)   SpO2 98%   BMI 33.13 kg/m   Physical Exam Vitals and nursing note reviewed.  Constitutional:      General: [name] is not in acute distress.    Appearance: Normal appearance. [name] is not ill-appearing.  HENT:     Mouth/Throat:     Mouth: Mucous membranes are moist.     Pharynx: Oropharynx is clear.  Neck:     Comments: R internal jugular placement, questionable redundancy of the line  Cardiovascular:     Rate and Rhythm: Normal rate and regular rhythm.  Pulmonary:     Effort: Pulmonary effort is normal. No respiratory distress.     Breath sounds: Normal breath sounds.  Abdominal:     General: Abdomen is flat. There is no distension.     Palpations: Abdomen is soft.  Musculoskeletal:     Cervical back: Normal range of motion.  Skin:    General: Skin is warm and dry.  Neurological:     General: No focal deficit present.     Mental Status: [name] is alert and oriented to person, place, and time. Mental status is at baseline.  Psychiatric:        Mood and Affect: Mood normal.        Behavior: Behavior normal.        Thought Content: Thought content normal.        Judgment: Judgment normal.      MD Evaluation Airway: WNL Heart: WNL Abdomen: WNL Chest/ Lungs: WNL ASA  Classification: 3 Mallampati/Airway Score: Two   Imaging: IR CV Line Injection Result Date: 12/05/2023 CLINICAL DATA:  re-evaluation of port, it has been flipping and now unable to draw blood from port EXAM: PORT  CATHETER INJECTION UNDER  FLUOROSCOPY TECHNIQUE: The procedure, risks (including but not limited to bleeding, infection, organ damage ), benefits, and alternatives were explained to the patient. Questions regarding the procedure were encouraged and answered. The patient understands and consents to the procedure. Survey fluoroscopic inspection reveals stable position of the right IJ port catheter, tip at the cavoatrial junction. Injection demonstrates patency of the pole reservoir and tubing. No leak or fracture. There is no definite fibrin sheath noted at the tip. Given the history of difficulties with aspiration, 2 mL CathFlow placed into the  catheter to dwell until her next appointment. IMPRESSION: 1. Normal right IJ port catheter injection under fluoroscopy. 2. Catheter dwell initiated using 2 mL CathFlow for presumed incompletely occlusive fibrin sheath. Electronically Signed   By: Nicoletta Barrier M.D.   On: 12/05/2023 14:05    Labs:  CBC: Recent Labs    05/30/23 0516 05/31/23 0549 06/01/23 0543 09/13/23 1029  WBC 12.5* 12.8* 12.2* 11.9*  HGB 9.8* 9.6* 9.5* 10.4*  HCT 29.7* 29.2* 30.7* 32.4*  PLT 290 287 276 351.0    COAGS: No results for input(s): "INR", "APTT" in the last 8760 hours.  BMP: Recent Labs    05/29/23 0642 05/30/23 0516 05/31/23 0549 06/01/23 0543 09/13/23 1029  NA 134* 133* 139 136 137  K 3.0* 3.3* 3.2* 4.0 3.8  CL 104 96* 91* 90* 108  CO2 17* 24 36* 36* 15*  GLUCOSE 159* 148* 141* 126* 126*  BUN 50* 36* 25* 21 42*  CALCIUM 8.4* 7.9* 8.1* 8.2* 7.4*  CREATININE 3.38* 3.21* 2.76* 2.81* 3.56*  GFRNONAA 14* 15* 18* 18*  --     LIVER FUNCTION TESTS: Recent Labs    05/30/23 0516 05/31/23 0549 06/01/23 0543 09/13/23 1029  BILITOT 0.3 0.5 0.4 0.4  AST 16 21 26 20   ALT 18 20 22 14   ALKPHOS 109 116 118 138*  PROT 6.0* 6.2* 6.3* 7.2  ALBUMIN 2.5* 2.7* 2.6* 4.0    TUMOR MARKERS: No results for input(s): "AFPTM", "CEA", "CA199", "CHROMGRNA" in the last 8760 hours.  Assessment and  Plan: Patient with past medical history of dehydration requiring frequent infusions s/p Port placement 04/2023 presents with complaint of various instances of poorly functioning Port. He hs been evaluated several times in IR with restoration of access, however continues to have access issues.    Case reviewed by Dr. Nereida Banning who approves patient for procedure.  Patient presents today in their usual state of health.  [name] has been NPO and is not currently on blood thinners.   Risks and benefits of image guided port-a-catheter placement was discussed with the patient including, but not limited to bleeding, infection, pneumothorax, or fibrin sheath development and need for additional procedures.  All of the patient's questions were answered, patient is agreeable to proceed. Consent signed and in chart.  Advance Care Plan: The advanced care plan/surrogate decision maker was discussed at the time of visit and the patient did not wish to discuss or was not able to name a surrogate decision maker or provide an advance care plan.     Thank you for this interesting consult.  I greatly enjoyed meeting [name] and look forward to participating in their care.  A copy of this report was sent to the requesting provider on this date.  Electronically Signed: Chiffon Kittleson Sue-Ellen Marquel Spoto, PA 12/22/2023, 12:00 PM   I spent a total of  30 Minutes   in face to face in clinical consultation, greater than 50% of which was counseling/coordinating care for malfunctioning Port.

## 2023-12-22 NOTE — Progress Notes (Signed)
 Patient was given discharge instructions. She verbalized understanding.

## 2023-12-22 NOTE — Progress Notes (Signed)
 Diagnosis: Dehydration  Provider:  Mannam, Praveen MD  Procedure: IV Infusion  IV Type: Peripheral, IV Location: L Forearm  Normal Saline, Dose: 1000 ml  Infusion Start Time: 0814 am  Infusion Stop Time: 0923 am  Post Infusion IV Care: Observation period completed and Peripheral IV Discontinued  Discharge: Condition: Good, Destination: Home . AVS Declined  Performed by:  Mayme Spearman, RN

## 2023-12-22 NOTE — Procedures (Signed)
 Interventional Radiology Procedure Note  Procedure: Single Lumen Power Port Placement; removal of malfunctioning port  Access:  Right IJ vein.  Findings: Catheter tip positioned at SVC/RA junction. Port is ready for immediate use.   Complications: None  EBL: < 10 mL  Recommendations:  - Ok to shower in 24 hours - Do not submerge for 7 days - Routine line care   Satvik Parco T. Nereida Banning, M.D Pager:  [phone]

## 2023-12-26 ENCOUNTER — Ambulatory Visit

## 2023-12-26 VITALS — BP 162/90 | HR 86 | Temp 97.7°F | Resp 20 | Ht 63.0 in | Wt 184.8 lb

## 2023-12-26 DIAGNOSIS — E860 Dehydration: Secondary | ICD-10-CM

## 2023-12-26 MED ORDER — HEPARIN SOD (PORK) LOCK FLUSH 100 UNIT/ML IV SOLN
250.0000 [IU] | Freq: Once | INTRAVENOUS | Status: DC | PRN
Start: 2023-12-26 — End: 2023-12-26

## 2023-12-26 MED ORDER — SODIUM CHLORIDE 0.9 % IV BOLUS
1000.0000 mL | Freq: Once | INTRAVENOUS | Status: AC
  Administered 2023-12-26: 1000 mL via INTRAVENOUS
  Filled 2023-12-26: qty 1000

## 2023-12-26 MED ORDER — HEPARIN SOD (PORK) LOCK FLUSH 100 UNIT/ML IV SOLN
500.0000 [IU] | Freq: Once | INTRAVENOUS | Status: AC | PRN
  Administered 2023-12-26: 500 [IU]
  Filled 2023-12-26: qty 5

## 2023-12-26 NOTE — Progress Notes (Signed)
 Diagnosis: Dehydration  Provider:  Praveen Mannam MD  Procedure: IV Infusion  IV Type: Port a Cath, IV Location: R Chest  Normal Saline, Dose: 1000 ml  Infusion Start Time: 0912  Infusion Stop Time: 1021  Post Infusion IV Care: Port a Cath Deaccessed/Flushed,Heparin  locked  Discharge: Condition: Good, Destination: Home . AVS Declined  Performed by:  Marimar Suber, RN

## 2023-12-28 ENCOUNTER — Ambulatory Visit (INDEPENDENT_AMBULATORY_CARE_PROVIDER_SITE_OTHER)

## 2023-12-28 VITALS — BP 136/86 | HR 86 | Temp 97.8°F | Resp 18 | Ht 63.0 in | Wt 184.2 lb

## 2023-12-28 DIAGNOSIS — E860 Dehydration: Secondary | ICD-10-CM | POA: Diagnosis not present

## 2023-12-28 MED ORDER — HEPARIN SOD (PORK) LOCK FLUSH 100 UNIT/ML IV SOLN
250.0000 [IU] | Freq: Once | INTRAVENOUS | Status: DC | PRN
Start: 2023-12-28 — End: 2023-12-28

## 2023-12-28 MED ORDER — SODIUM CHLORIDE 0.9 % IV BOLUS
1000.0000 mL | Freq: Once | INTRAVENOUS | Status: AC
  Administered 2023-12-28: 1000 mL via INTRAVENOUS
  Filled 2023-12-28: qty 1000

## 2023-12-28 MED ORDER — HEPARIN SOD (PORK) LOCK FLUSH 100 UNIT/ML IV SOLN
500.0000 [IU] | Freq: Once | INTRAVENOUS | Status: AC | PRN
  Administered 2023-12-28: 500 [IU]
  Filled 2023-12-28: qty 5

## 2023-12-28 NOTE — Progress Notes (Signed)
 Diagnosis: Dehydration  Provider:  Praveen Mannam MD  Procedure: IV Infusion  IV Type: Port a Cath, IV Location: R Chest  Normal Saline, Dose: 1000 ml  Infusion Start Time: 0845  Infusion Stop Time: 0953  Post Infusion IV Care: Port a Cath Deaccessed/Flushed,Heparin  locked.  Discharge: Condition: Good, Destination: Home . AVS Declined  Performed by:  Clotilda Hafer, RN

## 2023-12-30 ENCOUNTER — Ambulatory Visit

## 2023-12-30 VITALS — BP 113/75 | HR 87 | Temp 97.7°F | Resp 16 | Ht 63.0 in | Wt 185.8 lb

## 2023-12-30 DIAGNOSIS — Z932 Ileostomy status: Secondary | ICD-10-CM | POA: Diagnosis not present

## 2023-12-30 DIAGNOSIS — R198 Other specified symptoms and signs involving the digestive system and abdomen: Secondary | ICD-10-CM | POA: Diagnosis not present

## 2023-12-30 DIAGNOSIS — E860 Dehydration: Secondary | ICD-10-CM | POA: Diagnosis not present

## 2023-12-30 DIAGNOSIS — K50819 Crohn's disease of both small and large intestine with unspecified complications: Secondary | ICD-10-CM

## 2023-12-30 MED ORDER — HEPARIN SOD (PORK) LOCK FLUSH 100 UNIT/ML IV SOLN
500.0000 [IU] | Freq: Once | INTRAVENOUS | Status: AC | PRN
  Administered 2023-12-30: 500 [IU]
  Filled 2023-12-30: qty 5

## 2023-12-30 MED ORDER — HEPARIN SOD (PORK) LOCK FLUSH 100 UNIT/ML IV SOLN: 250.0000 [IU] | Freq: Once | INTRAVENOUS | Status: DC | PRN

## 2023-12-30 MED ORDER — OCTREOTIDE ACETATE 20 MG IM KIT
20.0000 mg | PACK | Freq: Once | INTRAMUSCULAR | Status: AC
Start: 2023-12-30 — End: 2023-12-30
  Administered 2023-12-30: 20 mg via INTRAMUSCULAR
  Filled 2023-12-30: qty 1

## 2023-12-30 MED ORDER — SODIUM CHLORIDE 0.9 % IV BOLUS
1000.0000 mL | Freq: Once | INTRAVENOUS | Status: AC
  Administered 2023-12-30: 1000 mL via INTRAVENOUS
  Filled 2023-12-30: qty 1000

## 2023-12-30 NOTE — Progress Notes (Signed)
 Diagnosis: Dehydration  Provider:  Phyllis Breeze MD  Procedure: IV Infusion  IV Type: Port a Cath, IV Location: R Chest  Normal Saline, Dose: 1000 ml  Infusion Start Time: 0838  Infusion Stop Time: 0948  Diagnosis: Crohn's Disease  Provider:  Mannam, Praveen MD  Procedure: Injection  Octreotide , Dose: 20 mg, Site: intramuscular, Number of injections: 1  Injection Site(s): Right upper quad. gluteus    Post Infusion IV Care: Port a Cath Deaccessed/Flushed  Discharge: Condition: Good, Destination: Home . AVS Declined  Performed by:  Shirly Dow, RN

## 2024-01-02 ENCOUNTER — Ambulatory Visit (INDEPENDENT_AMBULATORY_CARE_PROVIDER_SITE_OTHER)

## 2024-01-02 VITALS — BP 159/90 | HR 80 | Temp 97.7°F | Resp 18 | Ht 63.0 in | Wt 185.4 lb

## 2024-01-02 DIAGNOSIS — E860 Dehydration: Secondary | ICD-10-CM

## 2024-01-02 MED ORDER — HEPARIN SOD (PORK) LOCK FLUSH 100 UNIT/ML IV SOLN
500.0000 [IU] | Freq: Once | INTRAVENOUS | Status: AC | PRN
Start: 2024-01-02 — End: 2024-01-02
  Administered 2024-01-02: 500 [IU]
  Filled 2024-01-02: qty 5

## 2024-01-02 MED ORDER — SODIUM CHLORIDE 0.9 % IV BOLUS
1000.0000 mL | Freq: Once | INTRAVENOUS | Status: AC
  Administered 2024-01-02: 1000 mL via INTRAVENOUS
  Filled 2024-01-02: qty 1000

## 2024-01-02 MED ORDER — HEPARIN SOD (PORK) LOCK FLUSH 100 UNIT/ML IV SOLN: 250.0000 [IU] | Freq: Once | INTRAVENOUS | Status: DC | PRN

## 2024-01-02 NOTE — Progress Notes (Signed)
 Diagnosis: Dehydration  Provider:  Praveen Mannam MD  Procedure: IV Infusion  IV Type: Port a Cath, IV Location: R Chest  Normal Saline, Dose: 1000 ml  Infusion Start Time: 0824  Infusion Stop Time: 0935  Post Infusion IV Care: Port a Cath Deaccessed/Flushed,Heparin  locked.  Discharge: Condition: Good, Destination: Home . AVS Declined  Performed by:  Rayane Gallardo, RN

## 2024-01-04 ENCOUNTER — Ambulatory Visit (INDEPENDENT_AMBULATORY_CARE_PROVIDER_SITE_OTHER)

## 2024-01-04 VITALS — BP 151/84 | HR 96 | Temp 97.7°F | Resp 18 | Ht 63.0 in | Wt 185.2 lb

## 2024-01-04 DIAGNOSIS — E860 Dehydration: Secondary | ICD-10-CM | POA: Diagnosis not present

## 2024-01-04 MED ORDER — HEPARIN SOD (PORK) LOCK FLUSH 100 UNIT/ML IV SOLN: 250.0000 [IU] | Freq: Once | INTRAVENOUS | Status: DC | PRN

## 2024-01-04 MED ORDER — SODIUM CHLORIDE 0.9 % IV BOLUS
1000.0000 mL | Freq: Once | INTRAVENOUS | Status: AC
  Administered 2024-01-04: 1000 mL via INTRAVENOUS
  Filled 2024-01-04: qty 1000

## 2024-01-04 MED ORDER — HEPARIN SOD (PORK) LOCK FLUSH 100 UNIT/ML IV SOLN
500.0000 [IU] | Freq: Once | INTRAVENOUS | Status: AC | PRN
  Administered 2024-01-04: 500 [IU]
  Filled 2024-01-04: qty 5

## 2024-01-04 NOTE — Progress Notes (Signed)
 Diagnosis: Dehydration  Provider:  Praveen Mannam MD  Procedure: IV Infusion  IV Type: Port a Cath, IV Location: R Chest  Normal Saline, Dose: 1000 ml  Infusion Start Time: 0831  Infusion Stop Time: 0941   Post Infusion IV Care: Port a Cath Deaccessed/Flushed and heparin  locked.  Discharge: Condition: Good, Destination: Home . AVS Declined  Performed by:  Navya Timmons, RN

## 2024-01-05 DIAGNOSIS — N184 Chronic kidney disease, stage 4 (severe): Secondary | ICD-10-CM | POA: Diagnosis not present

## 2024-01-05 DIAGNOSIS — N179 Acute kidney failure, unspecified: Secondary | ICD-10-CM | POA: Diagnosis not present

## 2024-01-05 DIAGNOSIS — R768 Other specified abnormal immunological findings in serum: Secondary | ICD-10-CM | POA: Diagnosis not present

## 2024-01-05 DIAGNOSIS — E871 Hypo-osmolality and hyponatremia: Secondary | ICD-10-CM | POA: Diagnosis not present

## 2024-01-05 DIAGNOSIS — K5090 Crohn's disease, unspecified, without complications: Secondary | ICD-10-CM | POA: Diagnosis not present

## 2024-01-05 DIAGNOSIS — I129 Hypertensive chronic kidney disease with stage 1 through stage 4 chronic kidney disease, or unspecified chronic kidney disease: Secondary | ICD-10-CM | POA: Diagnosis not present

## 2024-01-05 DIAGNOSIS — E876 Hypokalemia: Secondary | ICD-10-CM | POA: Diagnosis not present

## 2024-01-05 DIAGNOSIS — D631 Anemia in chronic kidney disease: Secondary | ICD-10-CM | POA: Diagnosis not present

## 2024-01-05 DIAGNOSIS — E88819 Insulin resistance, unspecified: Secondary | ICD-10-CM | POA: Diagnosis not present

## 2024-01-05 DIAGNOSIS — N2581 Secondary hyperparathyroidism of renal origin: Secondary | ICD-10-CM | POA: Diagnosis not present

## 2024-01-05 DIAGNOSIS — D509 Iron deficiency anemia, unspecified: Secondary | ICD-10-CM | POA: Diagnosis not present

## 2024-01-05 DIAGNOSIS — M109 Gout, unspecified: Secondary | ICD-10-CM | POA: Diagnosis not present

## 2024-01-06 ENCOUNTER — Ambulatory Visit (INDEPENDENT_AMBULATORY_CARE_PROVIDER_SITE_OTHER)

## 2024-01-06 VITALS — BP 147/84 | HR 97 | Temp 97.8°F | Resp 18 | Ht 63.0 in | Wt 187.4 lb

## 2024-01-06 DIAGNOSIS — E860 Dehydration: Secondary | ICD-10-CM | POA: Diagnosis not present

## 2024-01-06 LAB — LAB REPORT - SCANNED
Creatinine, POC: 113.6 mg/dL
EGFR: 11

## 2024-01-06 MED ORDER — HEPARIN SOD (PORK) LOCK FLUSH 100 UNIT/ML IV SOLN
500.0000 [IU] | Freq: Once | INTRAVENOUS | Status: AC | PRN
  Administered 2024-01-06: 500 [IU]
  Filled 2024-01-06: qty 5

## 2024-01-06 MED ORDER — SODIUM CHLORIDE 0.9 % IV BOLUS
1000.0000 mL | Freq: Once | INTRAVENOUS | Status: AC
  Administered 2024-01-06: 1000 mL via INTRAVENOUS
  Filled 2024-01-06: qty 1000

## 2024-01-06 MED ORDER — HEPARIN SOD (PORK) LOCK FLUSH 100 UNIT/ML IV SOLN: 250.0000 [IU] | Freq: Once | INTRAVENOUS | Status: DC | PRN

## 2024-01-06 NOTE — Progress Notes (Signed)
 Diagnosis: Dehydration  Provider:  Praveen Mannam MD  Procedure: IV Infusion  IV Type: Port a Cath, IV Location: R Chest  Normal Saline, Dose: 1000 ml  Infusion Start Time: 0827  Infusion Stop Time: 0940  Post Infusion IV Care: Port a Cath Deaccessed/Flushed Heparin  flushed   Discharge: Condition: Good, Destination: Home . AVS Provided and AVS Declined  Performed by:  Nicky Milhouse, RN

## 2024-01-09 ENCOUNTER — Ambulatory Visit

## 2024-01-09 VITALS — BP 152/89 | HR 85 | Temp 97.5°F | Resp 20 | Ht 63.0 in | Wt 185.4 lb

## 2024-01-09 DIAGNOSIS — E860 Dehydration: Secondary | ICD-10-CM | POA: Diagnosis not present

## 2024-01-09 MED ORDER — SODIUM CHLORIDE 0.9 % IV BOLUS
1000.0000 mL | Freq: Once | INTRAVENOUS | Status: AC
  Administered 2024-01-09: 1000 mL via INTRAVENOUS
  Filled 2024-01-09: qty 1000

## 2024-01-09 MED ORDER — ANTICOAGULANT SODIUM CITRATE 4% (200MG/5ML) IV SOLN: 5.0000 mL | Freq: Once | Status: DC | PRN

## 2024-01-09 MED ORDER — HEPARIN SOD (PORK) LOCK FLUSH 100 UNIT/ML IV SOLN: 250.0000 [IU] | Freq: Once | INTRAVENOUS | Status: DC | PRN

## 2024-01-09 MED ORDER — SODIUM CHLORIDE 0.9% FLUSH: 10.0000 mL | Freq: Once | INTRAVENOUS | Status: DC | PRN

## 2024-01-09 MED ORDER — SODIUM CHLORIDE 0.9% FLUSH: 3.0000 mL | Freq: Once | INTRAVENOUS | Status: DC | PRN

## 2024-01-09 MED ORDER — ALTEPLASE 2 MG IJ SOLR: 2.0000 mg | Freq: Once | INTRAMUSCULAR | Status: DC | PRN

## 2024-01-09 MED ORDER — HEPARIN SOD (PORK) LOCK FLUSH 100 UNIT/ML IV SOLN
500.0000 [IU] | Freq: Once | INTRAVENOUS | Status: AC | PRN
  Administered 2024-01-09: 500 [IU]
  Filled 2024-01-09: qty 5

## 2024-01-09 NOTE — Progress Notes (Signed)
 Diagnosis: Dehydration  Provider:  Praveen Mannam MD  Procedure: IV Infusion  IV Type: Port a Cath, IV Location: R Chest  Normal Saline, Dose: 1000 ml  Infusion Start Time: 0833  Infusion Stop Time: 0941  Post Infusion IV Care: Port a Cath Deaccessed/Flushed and   Discharge: Condition: Good, Destination: Home . AVS Declined  Performed by:  Rosiland Sen, RN

## 2024-01-11 ENCOUNTER — Ambulatory Visit

## 2024-01-11 VITALS — BP 152/85 | HR 90 | Temp 97.5°F | Resp 18 | Ht 63.0 in | Wt 188.2 lb

## 2024-01-11 DIAGNOSIS — E860 Dehydration: Secondary | ICD-10-CM | POA: Diagnosis not present

## 2024-01-11 MED ORDER — HEPARIN SOD (PORK) LOCK FLUSH 100 UNIT/ML IV SOLN: 250.0000 [IU] | Freq: Once | INTRAVENOUS | Status: DC | PRN

## 2024-01-11 MED ORDER — SODIUM CHLORIDE 0.9 % IV BOLUS
1000.0000 mL | Freq: Once | INTRAVENOUS | Status: AC
  Administered 2024-01-11: 1000 mL via INTRAVENOUS
  Filled 2024-01-11: qty 1000

## 2024-01-11 MED ORDER — HEPARIN SOD (PORK) LOCK FLUSH 100 UNIT/ML IV SOLN
500.0000 [IU] | Freq: Once | INTRAVENOUS | Status: AC | PRN
Start: 2024-01-11 — End: 2024-01-11
  Administered 2024-01-11: 500 [IU]
  Filled 2024-01-11: qty 5

## 2024-01-11 NOTE — Progress Notes (Signed)
 Diagnosis: Dehydration  Provider:  Praveen Mannam MD  Procedure: IV Infusion  IV Type: Port a Cath, IV Location: R Chest  Normal Saline, Dose: 1000 ml  Infusion Start Time: 0841  Infusion Stop Time: 0950  Post Infusion IV Care: Port a Cath Deaccessed/Flushed and heparin  locked.  Discharge: Condition: Good, Destination: Home . AVS Declined  Performed by:  Shauni Henner, RN

## 2024-01-13 ENCOUNTER — Ambulatory Visit (INDEPENDENT_AMBULATORY_CARE_PROVIDER_SITE_OTHER)

## 2024-01-13 VITALS — BP 147/80 | HR 86 | Temp 98.0°F | Resp 21 | Ht 63.0 in | Wt 186.0 lb

## 2024-01-13 DIAGNOSIS — E860 Dehydration: Secondary | ICD-10-CM

## 2024-01-13 MED ORDER — HEPARIN SOD (PORK) LOCK FLUSH 100 UNIT/ML IV SOLN
500.0000 [IU] | Freq: Once | INTRAVENOUS | Status: AC | PRN
Start: 2024-01-13 — End: 2024-01-13
  Administered 2024-01-13: 500 [IU]
  Filled 2024-01-13: qty 5

## 2024-01-13 MED ORDER — SODIUM CHLORIDE 0.9 % IV BOLUS
1000.0000 mL | Freq: Once | INTRAVENOUS | Status: AC
  Administered 2024-01-13: 1000 mL via INTRAVENOUS
  Filled 2024-01-13: qty 1000

## 2024-01-13 MED ORDER — HEPARIN SOD (PORK) LOCK FLUSH 100 UNIT/ML IV SOLN: 250.0000 [IU] | Freq: Once | INTRAVENOUS | Status: DC | PRN

## 2024-01-13 NOTE — Progress Notes (Signed)
 Diagnosis: Dehydration  Provider:  Praveen Mannam MD  Procedure: IV Infusion  IV Type: Port a Cath, IV Location: R Chest  Normal Saline, Dose: 1000 ml  Infusion Start Time: 0841  Infusion Stop Time: 0948  Post Infusion IV Care: Port a Cath Deaccessed/Flushed, Heparin  flushed  Discharge: Condition: Good, Destination: Home . AVS Declined  Performed by:  Anaka Beazer, RN

## 2024-01-16 ENCOUNTER — Ambulatory Visit

## 2024-01-16 VITALS — BP 144/87 | HR 89 | Temp 97.8°F | Resp 20 | Ht 63.0 in | Wt 186.8 lb

## 2024-01-16 DIAGNOSIS — E860 Dehydration: Secondary | ICD-10-CM | POA: Diagnosis not present

## 2024-01-16 MED ORDER — SODIUM CHLORIDE 0.9 % IV BOLUS
1000.0000 mL | Freq: Once | INTRAVENOUS | Status: AC
Start: 2024-01-16 — End: 2024-01-16
  Administered 2024-01-16: 1000 mL via INTRAVENOUS
  Filled 2024-01-16: qty 1000

## 2024-01-16 MED ORDER — HEPARIN SOD (PORK) LOCK FLUSH 100 UNIT/ML IV SOLN: 250.0000 [IU] | Freq: Once | INTRAVENOUS | Status: DC | PRN

## 2024-01-16 MED ORDER — HEPARIN SOD (PORK) LOCK FLUSH 100 UNIT/ML IV SOLN: 500.0000 [IU] | Freq: Once | INTRAVENOUS | Status: DC | PRN

## 2024-01-16 NOTE — Progress Notes (Signed)
 Diagnosis: Dehydration  Provider:  Praveen Mannam MD  Procedure: IV Infusion  IV Type: Peripheral, IV Location: L Forearm  Normal Saline, Dose: 1000 ml  Infusion Start Time: 0849  Infusion Stop Time: 0958  Post Infusion IV Care: Peripheral IV Discontinued  Discharge: Condition: Good, Destination: Home . AVS Declined  Performed by:  Shirly Dow, RN

## 2024-01-18 ENCOUNTER — Ambulatory Visit

## 2024-01-18 VITALS — BP 120/78 | HR 86 | Temp 97.5°F | Resp 20 | Ht 63.0 in | Wt 185.0 lb

## 2024-01-18 DIAGNOSIS — E860 Dehydration: Secondary | ICD-10-CM | POA: Diagnosis not present

## 2024-01-18 MED ORDER — HEPARIN SOD (PORK) LOCK FLUSH 100 UNIT/ML IV SOLN
500.0000 [IU] | Freq: Once | INTRAVENOUS | Status: AC | PRN
  Administered 2024-01-18: 500 [IU]
  Filled 2024-01-18: qty 5

## 2024-01-18 MED ORDER — SODIUM CHLORIDE 0.9 % IV BOLUS
1000.0000 mL | Freq: Once | INTRAVENOUS | Status: AC
Start: 2024-01-18 — End: 2024-01-18
  Administered 2024-01-18: 1000 mL via INTRAVENOUS
  Filled 2024-01-18: qty 1000

## 2024-01-18 MED ORDER — HEPARIN SOD (PORK) LOCK FLUSH 100 UNIT/ML IV SOLN: 250.0000 [IU] | Freq: Once | INTRAVENOUS | Status: DC | PRN

## 2024-01-18 NOTE — Progress Notes (Signed)
 Diagnosis: Dehydration  Provider:  Praveen Mannam MD  Procedure: IV Infusion  IV Type: Port a Cath, IV Location: R Chest  Normal Saline, Dose: 1000 ml  Infusion Start Time: 0829  Infusion Stop Time: 0941  Post Infusion IV Care: Port a Cath Deaccessed/Flushed,Heparin  locked.  Discharge: Condition: Good, Destination: Home . AVS Declined  Performed by:  Dondra Rhett, RN

## 2024-01-20 ENCOUNTER — Ambulatory Visit

## 2024-01-20 VITALS — BP 110/71 | HR 92 | Temp 98.2°F | Resp 18 | Ht 63.0 in | Wt 183.8 lb

## 2024-01-20 DIAGNOSIS — E860 Dehydration: Secondary | ICD-10-CM | POA: Diagnosis not present

## 2024-01-20 MED ORDER — SODIUM CHLORIDE 0.9 % IV BOLUS
1000.0000 mL | Freq: Once | INTRAVENOUS | Status: AC
  Administered 2024-01-20: 1000 mL via INTRAVENOUS
  Filled 2024-01-20: qty 1000

## 2024-01-20 MED ORDER — HEPARIN SOD (PORK) LOCK FLUSH 100 UNIT/ML IV SOLN: 250.0000 [IU] | Freq: Once | INTRAVENOUS | Status: DC | PRN

## 2024-01-20 MED ORDER — HEPARIN SOD (PORK) LOCK FLUSH 100 UNIT/ML IV SOLN
500.0000 [IU] | Freq: Once | INTRAVENOUS | Status: AC | PRN
  Administered 2024-01-20: 500 [IU]
  Filled 2024-01-20: qty 5

## 2024-01-20 NOTE — Progress Notes (Signed)
 Diagnosis: Dehydration  Provider:  Praveen Mannam MD  Procedure: IV Infusion  IV Type: Port a Cath, IV Location: R Chest  Normal Saline, Dose: 1000 ml  Infusion Start Time: 0831  Infusion Stop Time: 0937  Post Infusion IV Care: Port a Cath Deaccessed/Flushed Heparin  flushed  Discharge: Condition: Good, Destination: Home . AVS Declined  Performed by:  Cliffard Hair, RN

## 2024-01-23 ENCOUNTER — Ambulatory Visit

## 2024-01-23 VITALS — BP 160/94 | HR 102 | Temp 97.5°F | Resp 20 | Ht 63.0 in | Wt 181.6 lb

## 2024-01-23 DIAGNOSIS — E860 Dehydration: Secondary | ICD-10-CM | POA: Diagnosis not present

## 2024-01-23 MED ORDER — HEPARIN SOD (PORK) LOCK FLUSH 100 UNIT/ML IV SOLN
500.0000 [IU] | Freq: Once | INTRAVENOUS | Status: AC | PRN
  Administered 2024-01-23: 500 [IU]
  Filled 2024-01-23: qty 5

## 2024-01-23 MED ORDER — SODIUM CHLORIDE 0.9 % IV BOLUS
1000.0000 mL | Freq: Once | INTRAVENOUS | Status: AC
Start: 2024-01-23 — End: 2024-01-23
  Administered 2024-01-23: 1000 mL via INTRAVENOUS
  Filled 2024-01-23: qty 1000

## 2024-01-23 MED ORDER — HEPARIN SOD (PORK) LOCK FLUSH 100 UNIT/ML IV SOLN: 250.0000 [IU] | Freq: Once | INTRAVENOUS | Status: DC | PRN

## 2024-01-23 NOTE — Progress Notes (Signed)
 Diagnosis: Dehydration  Provider:  Praveen Mannam MD  Procedure: IV Infusion  IV Type: Port a Cath, IV Location: R Chest  Normal Saline, Dose: 1000 ml  Infusion Start Time: 0836  Infusion Stop Time: 0954  Post Infusion IV Care: Port a Cath Deaccessed/Flushed,Heparin  flushed.  Discharge: Condition: Good, Destination: Home . AVS Declined  Performed by:  Sayler Mickiewicz, RN

## 2024-01-25 ENCOUNTER — Ambulatory Visit (INDEPENDENT_AMBULATORY_CARE_PROVIDER_SITE_OTHER)

## 2024-01-25 VITALS — BP 146/88 | HR 93 | Temp 97.9°F | Resp 20 | Ht 63.0 in | Wt 183.4 lb

## 2024-01-25 DIAGNOSIS — E860 Dehydration: Secondary | ICD-10-CM | POA: Diagnosis not present

## 2024-01-25 MED ORDER — HEPARIN SOD (PORK) LOCK FLUSH 100 UNIT/ML IV SOLN
500.0000 [IU] | Freq: Once | INTRAVENOUS | Status: AC | PRN
  Administered 2024-01-25: 500 [IU]
  Filled 2024-01-25: qty 5

## 2024-01-25 MED ORDER — SODIUM CHLORIDE 0.9 % IV BOLUS
1000.0000 mL | Freq: Once | INTRAVENOUS | Status: AC
  Administered 2024-01-25: 1000 mL via INTRAVENOUS
  Filled 2024-01-25: qty 1000

## 2024-01-25 NOTE — Progress Notes (Signed)
 Diagnosis: Dehydration  Provider:  Praveen Mannam MD  Procedure: IV Infusion  IV Type: Port a Cath, IV Location: R Chest  Normal Saline, Dose: 1000 ml  Infusion Start Time: 1610  Infusion Stop Time: 0947  Post Infusion IV Care: Port a Cath Deaccessed/Flushedand Heparin  locked.  Discharge: Condition: Good, Destination: Home . AVS Declined  Performed by:  Jyll Tomaro, RN

## 2024-01-27 ENCOUNTER — Ambulatory Visit (INDEPENDENT_AMBULATORY_CARE_PROVIDER_SITE_OTHER)

## 2024-01-27 VITALS — BP 169/95 | HR 88 | Temp 98.0°F | Resp 18 | Ht 63.0 in | Wt 183.8 lb

## 2024-01-27 DIAGNOSIS — R198 Other specified symptoms and signs involving the digestive system and abdomen: Secondary | ICD-10-CM

## 2024-01-27 DIAGNOSIS — E860 Dehydration: Secondary | ICD-10-CM | POA: Diagnosis not present

## 2024-01-27 DIAGNOSIS — Z932 Ileostomy status: Secondary | ICD-10-CM

## 2024-01-27 DIAGNOSIS — K50819 Crohn's disease of both small and large intestine with unspecified complications: Secondary | ICD-10-CM

## 2024-01-27 MED ORDER — HEPARIN SOD (PORK) LOCK FLUSH 100 UNIT/ML IV SOLN
500.0000 [IU] | Freq: Once | INTRAVENOUS | Status: AC | PRN
  Administered 2024-01-27: 500 [IU]

## 2024-01-27 MED ORDER — SODIUM CHLORIDE 0.9 % IV BOLUS
1000.0000 mL | Freq: Once | INTRAVENOUS | Status: AC
  Administered 2024-01-27: 1000 mL via INTRAVENOUS
  Filled 2024-01-27: qty 1000

## 2024-01-27 MED ORDER — OCTREOTIDE ACETATE 20 MG IM KIT
20.0000 mg | PACK | Freq: Once | INTRAMUSCULAR | Status: AC
  Administered 2024-01-27: 20 mg via INTRAMUSCULAR
  Filled 2024-01-27: qty 1

## 2024-01-27 NOTE — Progress Notes (Signed)
 Diagnosis: Dehydration  Provider:  Phyllis Breeze MD  Procedure: IV Infusion  IV Type: Port a Cath, IV Location: R Chest  Normal Saline, Dose: 1000 ml  Infusion Start Time: 0832  Infusion Stop Time: 0940  Diagnosis: Crohn's Disease  Provider:  Mannam, Praveen MD  Procedure: Injection  Octreotide , Dose: 20 mg, Site: intramuscular, Number of injections: 1  Injection Site(s): Right upper quad. gluteus  Post Care: Patient declined observation    Post Infusion IV Care: Port a Cath Deaccessed/Flushed Heparin  flushed  Discharge: Condition: Good, Destination: Home . AVS Declined  Performed by:  Gerrod Maule, RN

## 2024-01-31 ENCOUNTER — Ambulatory Visit

## 2024-01-31 VITALS — BP 152/88 | HR 85 | Temp 97.8°F | Resp 20 | Ht 63.0 in | Wt 183.4 lb

## 2024-01-31 DIAGNOSIS — E860 Dehydration: Secondary | ICD-10-CM | POA: Diagnosis not present

## 2024-01-31 MED ORDER — SODIUM CHLORIDE 0.9 % IV BOLUS
1000.0000 mL | Freq: Once | INTRAVENOUS | Status: AC
  Administered 2024-01-31: 1000 mL via INTRAVENOUS
  Filled 2024-01-31: qty 1000

## 2024-01-31 MED ORDER — HEPARIN SOD (PORK) LOCK FLUSH 100 UNIT/ML IV SOLN
250.0000 [IU] | Freq: Once | INTRAVENOUS | Status: DC | PRN
  Filled 2024-01-31: qty 5

## 2024-01-31 MED ORDER — HEPARIN SOD (PORK) LOCK FLUSH 100 UNIT/ML IV SOLN
500.0000 [IU] | Freq: Once | INTRAVENOUS | Status: AC | PRN
Start: 2024-01-31 — End: 2024-01-31
  Administered 2024-01-31: 500 [IU]
  Filled 2024-01-31: qty 5

## 2024-01-31 NOTE — Progress Notes (Signed)
 Diagnosis: Dehydration  Provider:  Praveen Mannam MD  Procedure: IV Infusion  IV Type: Port a Cath, IV Location: R Chest  Normal Saline, Dose: 1000 ml  Infusion Start Time: 0835  Infusion Stop Time: 0942  Post Infusion IV Care: Port a Cath Deaccessed/Flushed heparin  flushed   Discharge: Condition: Good, Destination: Home . AVS Declined  Performed by:  Marcus Schwandt, RN

## 2024-02-01 ENCOUNTER — Other Ambulatory Visit: Payer: Self-pay | Admitting: Family Medicine

## 2024-02-01 ENCOUNTER — Ambulatory Visit

## 2024-02-01 VITALS — BP 139/84 | HR 97 | Temp 97.9°F | Resp 20 | Ht 63.0 in | Wt 183.2 lb

## 2024-02-01 DIAGNOSIS — F322 Major depressive disorder, single episode, severe without psychotic features: Secondary | ICD-10-CM

## 2024-02-01 DIAGNOSIS — E860 Dehydration: Secondary | ICD-10-CM

## 2024-02-01 MED ORDER — HEPARIN SOD (PORK) LOCK FLUSH 100 UNIT/ML IV SOLN
500.0000 [IU] | Freq: Once | INTRAVENOUS | Status: AC | PRN
  Administered 2024-02-01: 500 [IU]
  Filled 2024-02-01: qty 5

## 2024-02-01 MED ORDER — HEPARIN SOD (PORK) LOCK FLUSH 100 UNIT/ML IV SOLN
250.0000 [IU] | Freq: Once | INTRAVENOUS | Status: DC | PRN
Start: 2024-02-01 — End: 2024-02-01

## 2024-02-01 MED ORDER — SODIUM CHLORIDE 0.9 % IV BOLUS
1000.0000 mL | Freq: Once | INTRAVENOUS | Status: AC
  Administered 2024-02-01: 1000 mL via INTRAVENOUS
  Filled 2024-02-01: qty 1000

## 2024-02-01 NOTE — Progress Notes (Signed)
 Diagnosis: Dehydration  Provider:  Praveen Mannam MD  Procedure: IV Infusion  IV Type: Port a Cath, IV Location: R Chest  Normal Saline, Dose: 1000 ml  Infusion Start Time: 0844  Infusion Stop Time: 0955  Post Infusion IV Care: Port a Cath Deaccessed/Flushed,Heparin  locked.  Discharge: Condition: Good, Destination: Home . AVS Declined  Performed by:  Simpson Paulos, RN

## 2024-02-03 ENCOUNTER — Ambulatory Visit

## 2024-02-03 VITALS — BP 162/88 | HR 98 | Temp 97.9°F | Resp 18 | Ht 63.0 in | Wt 185.4 lb

## 2024-02-03 DIAGNOSIS — E860 Dehydration: Secondary | ICD-10-CM | POA: Diagnosis not present

## 2024-02-03 MED ORDER — HEPARIN SOD (PORK) LOCK FLUSH 100 UNIT/ML IV SOLN
500.0000 [IU] | Freq: Once | INTRAVENOUS | Status: AC | PRN
  Administered 2024-02-03: 500 [IU]
  Filled 2024-02-03: qty 5

## 2024-02-03 MED ORDER — HEPARIN SOD (PORK) LOCK FLUSH 100 UNIT/ML IV SOLN
250.0000 [IU] | Freq: Once | INTRAVENOUS | Status: DC | PRN
Start: 2024-02-03 — End: 2024-02-03

## 2024-02-03 MED ORDER — SODIUM CHLORIDE 0.9 % IV BOLUS
1000.0000 mL | Freq: Once | INTRAVENOUS | Status: AC
  Administered 2024-02-03: 1000 mL via INTRAVENOUS
  Filled 2024-02-03: qty 1000

## 2024-02-03 NOTE — Progress Notes (Signed)
 Diagnosis: Dehydration  Provider:  Praveen Mannam MD  Procedure: IV Infusion  IV Type: Port a Cath, IV Location: R Chest  Normal Saline, Dose: 1000 ml  Infusion Start Time: 0821  Infusion Stop Time: 0930  Post Infusion IV Care: Port a Cath Deaccessed/Flushed and Heparin  locked.  Discharge: Condition: Good, Destination: Home . AVS Declined  Performed by:  Terralyn Matsumura, RN

## 2024-02-06 ENCOUNTER — Ambulatory Visit (INDEPENDENT_AMBULATORY_CARE_PROVIDER_SITE_OTHER)

## 2024-02-06 VITALS — BP 115/65 | HR 92 | Temp 97.8°F | Resp 20 | Ht 63.0 in | Wt 184.2 lb

## 2024-02-06 DIAGNOSIS — E860 Dehydration: Secondary | ICD-10-CM | POA: Diagnosis not present

## 2024-02-06 MED ORDER — HEPARIN SOD (PORK) LOCK FLUSH 100 UNIT/ML IV SOLN
250.0000 [IU] | Freq: Once | INTRAVENOUS | Status: DC | PRN
Start: 2024-02-06 — End: 2024-02-06

## 2024-02-06 MED ORDER — HEPARIN SOD (PORK) LOCK FLUSH 100 UNIT/ML IV SOLN
500.0000 [IU] | Freq: Once | INTRAVENOUS | Status: DC | PRN
Start: 2024-02-06 — End: 2024-02-06

## 2024-02-06 MED ORDER — SODIUM CHLORIDE 0.9 % IV BOLUS
1000.0000 mL | Freq: Once | INTRAVENOUS | Status: AC
  Administered 2024-02-06: 1000 mL via INTRAVENOUS
  Filled 2024-02-06: qty 1000

## 2024-02-06 NOTE — Progress Notes (Signed)
 Diagnosis: Dehydration  Provider:  Praveen Mannam MD  Procedure: IV Infusion  IV Type: Peripheral, IV Location: R Hand  Normal Saline, Dose: 1000 ml  Infusion Start Time: 0834 Infusion Stop Time: 0941  Post Infusion IV Care: Peripheral IV Discontinued  Discharge: Condition: Good, Destination: Home . AVS Declined  Performed by:  Savion Washam, RN

## 2024-02-08 ENCOUNTER — Ambulatory Visit

## 2024-02-08 VITALS — BP 193/106 | HR 100 | Temp 97.9°F | Resp 16 | Ht 63.0 in | Wt 183.8 lb

## 2024-02-08 DIAGNOSIS — E860 Dehydration: Secondary | ICD-10-CM | POA: Diagnosis not present

## 2024-02-08 MED ORDER — HEPARIN SOD (PORK) LOCK FLUSH 100 UNIT/ML IV SOLN
250.0000 [IU] | Freq: Once | INTRAVENOUS | Status: DC | PRN
Start: 2024-02-08 — End: 2024-02-08

## 2024-02-08 MED ORDER — SODIUM CHLORIDE 0.9 % IV BOLUS
1000.0000 mL | Freq: Once | INTRAVENOUS | Status: AC
  Administered 2024-02-08: 1000 mL via INTRAVENOUS
  Filled 2024-02-08: qty 1000

## 2024-02-08 MED ORDER — HEPARIN SOD (PORK) LOCK FLUSH 100 UNIT/ML IV SOLN
500.0000 [IU] | Freq: Once | INTRAVENOUS | Status: AC | PRN
  Administered 2024-02-08: 500 [IU]
  Filled 2024-02-08: qty 5

## 2024-02-08 NOTE — Progress Notes (Signed)
 Diagnosis: Dehydration  Provider:  Praveen Mannam MD  Procedure: IV Infusion  IV Type: Port a Cath, IV Location: R Chest  Normal Saline, Dose: 1000 ml  Infusion Start Time: 0832  Infusion Stop Time: 0941  Post Infusion IV Care: Port a Cath Deaccessed/Flushed and heparin  locked.  Discharge: Condition: Good, Destination: Home . AVS Declined  Performed by:  Earnestine Shipp, RN

## 2024-02-10 ENCOUNTER — Ambulatory Visit

## 2024-02-10 VITALS — BP 116/69 | HR 98 | Temp 97.5°F | Resp 20 | Ht 63.0 in | Wt 185.0 lb

## 2024-02-10 DIAGNOSIS — E860 Dehydration: Secondary | ICD-10-CM

## 2024-02-10 MED ORDER — SODIUM CHLORIDE 0.9 % IV BOLUS
1000.0000 mL | Freq: Once | INTRAVENOUS | Status: AC
  Administered 2024-02-10: 1000 mL via INTRAVENOUS
  Filled 2024-02-10: qty 1000

## 2024-02-10 MED ORDER — HEPARIN SOD (PORK) LOCK FLUSH 100 UNIT/ML IV SOLN
500.0000 [IU] | Freq: Once | INTRAVENOUS | Status: AC | PRN
  Administered 2024-02-10: 500 [IU]
  Filled 2024-02-10: qty 5

## 2024-02-10 NOTE — Progress Notes (Signed)
 Diagnosis: Dehydration  Provider:  Praveen Mannam MD  Procedure: IV Infusion  IV Type: Port a Cath, IV Location: R Chest  Normal Saline, Dose: 1000 ml  Infusion Start Time: 0841  Infusion Stop Time: 0949  Post Infusion IV Care: Port a Cath Deaccessed/Flushed  Discharge: Condition: Good, Destination: Home . AVS Declined  Performed by:  Niomi Valent, RN

## 2024-02-13 ENCOUNTER — Ambulatory Visit

## 2024-02-13 VITALS — BP 101/67 | HR 87 | Temp 98.0°F | Resp 20 | Ht 63.0 in | Wt 183.2 lb

## 2024-02-13 DIAGNOSIS — E860 Dehydration: Secondary | ICD-10-CM | POA: Diagnosis not present

## 2024-02-13 MED ORDER — HEPARIN SOD (PORK) LOCK FLUSH 100 UNIT/ML IV SOLN
250.0000 [IU] | Freq: Once | INTRAVENOUS | Status: DC | PRN
Start: 2024-02-13 — End: 2024-02-13

## 2024-02-13 MED ORDER — HEPARIN SOD (PORK) LOCK FLUSH 100 UNIT/ML IV SOLN
500.0000 [IU] | Freq: Once | INTRAVENOUS | Status: AC | PRN
  Administered 2024-02-13: 500 [IU]
  Filled 2024-02-13: qty 5

## 2024-02-13 MED ORDER — SODIUM CHLORIDE 0.9 % IV BOLUS
1000.0000 mL | Freq: Once | INTRAVENOUS | Status: AC
  Administered 2024-02-13: 1000 mL via INTRAVENOUS
  Filled 2024-02-13: qty 1000

## 2024-02-13 NOTE — Progress Notes (Signed)
 Diagnosis: Dehydration  Provider:  Praveen Mannam MD  Procedure: IV Infusion  IV Type: Port a Cath, IV Location: R Chest  Normal Saline, Dose: 1000 ml  Infusion Start Time: 0841  Infusion Stop Time: 0948  Post Infusion IV Care: Port a Cath Deaccessed/Flushed and heparin  locked.  Discharge: Condition: Good, Destination: Home . AVS Declined  Performed by:  Lovie Zarling, RN

## 2024-02-15 ENCOUNTER — Ambulatory Visit

## 2024-02-15 VITALS — BP 158/90 | HR 85 | Temp 98.6°F | Resp 20 | Ht 63.0 in | Wt 184.2 lb

## 2024-02-15 DIAGNOSIS — E860 Dehydration: Secondary | ICD-10-CM

## 2024-02-15 MED ORDER — HEPARIN SOD (PORK) LOCK FLUSH 100 UNIT/ML IV SOLN
500.0000 [IU] | Freq: Once | INTRAVENOUS | Status: AC | PRN
  Administered 2024-02-15: 500 [IU]
  Filled 2024-02-15: qty 5

## 2024-02-15 MED ORDER — SODIUM CHLORIDE 0.9 % IV BOLUS
1000.0000 mL | Freq: Once | INTRAVENOUS | Status: AC
  Administered 2024-02-15: 1000 mL via INTRAVENOUS
  Filled 2024-02-15: qty 1000

## 2024-02-15 MED ORDER — HEPARIN SOD (PORK) LOCK FLUSH 100 UNIT/ML IV SOLN
250.0000 [IU] | Freq: Once | INTRAVENOUS | Status: DC | PRN
Start: 2024-02-15 — End: 2024-02-15

## 2024-02-15 NOTE — Progress Notes (Signed)
 Diagnosis: Dehydration  Provider:  Praveen Mannam MD  Procedure: IV Infusion  IV Type: Peripheral, IV Location: R Chest  Normal Saline, Dose: 1000 ml  Infusion Start Time: 16109  Infusion Stop Time: 0928  Post Infusion IV Care: Port a Cath Deaccessed/Flushed  Discharge: Condition: Good, Destination: Home . AVS Declined  Performed by:  Shirly Dow, RN

## 2024-02-17 ENCOUNTER — Ambulatory Visit (INDEPENDENT_AMBULATORY_CARE_PROVIDER_SITE_OTHER)

## 2024-02-17 VITALS — BP 181/105 | HR 97 | Temp 98.2°F | Resp 20 | Ht 63.0 in | Wt 185.0 lb

## 2024-02-17 DIAGNOSIS — E860 Dehydration: Secondary | ICD-10-CM

## 2024-02-17 MED ORDER — SODIUM CHLORIDE 0.9 % IV BOLUS
1000.0000 mL | Freq: Once | INTRAVENOUS | Status: AC
  Filled 2024-02-17: qty 1000

## 2024-02-17 MED ORDER — HEPARIN SOD (PORK) LOCK FLUSH 100 UNIT/ML IV SOLN
500.0000 [IU] | Freq: Once | INTRAVENOUS | Status: AC | PRN
  Administered 2024-02-17: 500 [IU]
  Filled 2024-02-17: qty 5

## 2024-02-17 NOTE — Progress Notes (Signed)
 Diagnosis: Dehydration  Provider:  Praveen Mannam MD  Procedure: IV Infusion  IV Type: Port a Cath, IV Location: R Chest  Normal Saline, Dose: 1000 ml  Infusion Start Time: 0832  Infusion Stop Time: 0936  Post Infusion IV Care: Port a Cath Deaccessed/Flushed  Discharge: Condition: Good, Destination: Home . AVS Declined  Performed by:  Jovonta Levit, RN

## 2024-02-20 ENCOUNTER — Ambulatory Visit

## 2024-02-20 VITALS — BP 159/88 | HR 97 | Temp 98.6°F | Resp 16 | Ht 63.0 in | Wt 183.0 lb

## 2024-02-20 DIAGNOSIS — E860 Dehydration: Secondary | ICD-10-CM

## 2024-02-20 MED ORDER — HEPARIN SOD (PORK) LOCK FLUSH 100 UNIT/ML IV SOLN
250.0000 [IU] | Freq: Once | INTRAVENOUS | Status: DC | PRN
Start: 2024-02-20 — End: 2024-02-20

## 2024-02-20 MED ORDER — SODIUM CHLORIDE 0.9 % IV BOLUS
1000.0000 mL | Freq: Once | INTRAVENOUS | Status: AC
  Administered 2024-02-20: 1000 mL via INTRAVENOUS
  Filled 2024-02-20: qty 1000

## 2024-02-20 MED ORDER — HEPARIN SOD (PORK) LOCK FLUSH 100 UNIT/ML IV SOLN
500.0000 [IU] | Freq: Once | INTRAVENOUS | Status: AC | PRN
  Administered 2024-02-20: 500 [IU]
  Filled 2024-02-20: qty 5

## 2024-02-20 NOTE — Progress Notes (Signed)
 Diagnosis: Dehydration  Provider:  Praveen Mannam MD  Procedure: IV Infusion  IV Type: Port a Cath, IV Location: R Chest  Normal Saline, Dose: 1000 ml  Infusion Start Time: 0833  Infusion Stop Time: 0950  Post Infusion IV Care: Port a Cath Deaccessed/Flushed and Heparin  locked.  Discharge: Condition: Good, Destination: Home . AVS Declined  Performed by:  Anju Sereno, RN

## 2024-02-21 ENCOUNTER — Other Ambulatory Visit: Payer: Self-pay

## 2024-02-22 ENCOUNTER — Ambulatory Visit

## 2024-02-22 ENCOUNTER — Telehealth: Payer: Self-pay | Admitting: Gastroenterology

## 2024-02-22 VITALS — BP 152/78 | HR 86 | Temp 98.0°F | Resp 18 | Ht 63.0 in | Wt 182.0 lb

## 2024-02-22 DIAGNOSIS — E860 Dehydration: Secondary | ICD-10-CM | POA: Diagnosis not present

## 2024-02-22 MED ORDER — SODIUM CHLORIDE 0.9 % IV BOLUS
1000.0000 mL | Freq: Once | INTRAVENOUS | Status: AC
  Administered 2024-02-22: 1000 mL via INTRAVENOUS
  Filled 2024-02-22: qty 1000

## 2024-02-22 MED ORDER — HEPARIN SOD (PORK) LOCK FLUSH 100 UNIT/ML IV SOLN
500.0000 [IU] | Freq: Once | INTRAVENOUS | Status: AC | PRN
  Administered 2024-02-22: 500 [IU]
  Filled 2024-02-22: qty 5

## 2024-02-22 MED ORDER — HEPARIN SOD (PORK) LOCK FLUSH 100 UNIT/ML IV SOLN
250.0000 [IU] | Freq: Once | INTRAVENOUS | Status: DC | PRN
Start: 2024-02-22 — End: 2024-02-22

## 2024-02-22 NOTE — Telephone Encounter (Signed)
 Taegan a pharmacist with [facility] is calling to find out if the patient is taking dicyclomine  and lomotil . He said that they are not good for patients over 65 due to fall risk. He suggests that alternatives are found. He would recommend metamucil/miralax  or loperamide . Any further question or concerns, please contact at [phone]

## 2024-02-22 NOTE — Progress Notes (Signed)
 Diagnosis: Dehydration  Provider:  Praveen Mannam MD  Procedure: IV Infusion  IV Type: Port a Cath, IV Location: R Chest  Normal Saline, Dose: 1000 ml  Infusion Start Time: 0832  Infusion Stop Time: 0942  Post Infusion IV Care: Port a Cath Deaccessed/Flushed/Heparin  locked  Discharge: Condition: Good, Destination: Home . AVS Declined  Performed by:  Rachelle Bue, RN

## 2024-02-23 NOTE — Telephone Encounter (Signed)
 This patient has significant high ostomy output leading to hospitalization and CKD. I understand his concerns however she has been on a stable regimen for some time and tolerating it well. Benefits > risks, I have discussed this with the patient in the past. Metamucil is not appropriate for this patient. Thanks

## 2024-02-24 ENCOUNTER — Ambulatory Visit

## 2024-02-24 VITALS — BP 145/90 | HR 80 | Temp 98.4°F | Resp 18 | Ht 63.0 in | Wt 182.8 lb

## 2024-02-24 DIAGNOSIS — K50819 Crohn's disease of both small and large intestine with unspecified complications: Secondary | ICD-10-CM

## 2024-02-24 DIAGNOSIS — R198 Other specified symptoms and signs involving the digestive system and abdomen: Secondary | ICD-10-CM

## 2024-02-24 DIAGNOSIS — Z932 Ileostomy status: Secondary | ICD-10-CM | POA: Diagnosis not present

## 2024-02-24 DIAGNOSIS — E860 Dehydration: Secondary | ICD-10-CM | POA: Diagnosis not present

## 2024-02-24 MED ORDER — HEPARIN SOD (PORK) LOCK FLUSH 100 UNIT/ML IV SOLN
500.0000 [IU] | Freq: Once | INTRAVENOUS | Status: AC | PRN
  Administered 2024-02-24: 500 [IU]
  Filled 2024-02-24: qty 5

## 2024-02-24 MED ORDER — SODIUM CHLORIDE 0.9 % IV BOLUS
1000.0000 mL | Freq: Once | INTRAVENOUS | Status: AC
  Administered 2024-02-24: 1000 mL via INTRAVENOUS
  Filled 2024-02-24: qty 1000

## 2024-02-24 MED ORDER — OCTREOTIDE ACETATE 20 MG IM KIT
20.0000 mg | PACK | Freq: Once | INTRAMUSCULAR | Status: AC
  Administered 2024-02-24: 20 mg via INTRAMUSCULAR
  Filled 2024-02-24: qty 1

## 2024-02-24 NOTE — Progress Notes (Signed)
 Diagnosis: Dehydration  Provider:  Phyllis Breeze MD  Procedure: IV Infusion  IV Type: Port a Cath, IV Location: R Chest  Normal Saline, Dose: 1000 ml  Infusion Start Time: 0832  Infusion Stop Time: 0945  Post Infusion IV Care: Port a Cath Deaccessed/Flushed Heparin  locked     Diagnosis: Crohn's Disease  Provider:  Mannam, Praveen MD  Procedure: Injection  Octreotide , Dose: 20mg , Site: intramuscular, Number of injections: 1  Injection Site(s): Right upper quad. gluteus  Post Care: Patient declined observation  Discharge: Condition: Good, Destination: Home . AVS Declined  Performed by:  Macey Wurtz, RN

## 2024-02-27 ENCOUNTER — Ambulatory Visit (INDEPENDENT_AMBULATORY_CARE_PROVIDER_SITE_OTHER)

## 2024-02-27 VITALS — BP 126/77 | HR 81 | Temp 97.6°F | Resp 18 | Ht 63.0 in | Wt 182.8 lb

## 2024-02-27 DIAGNOSIS — E860 Dehydration: Secondary | ICD-10-CM

## 2024-02-27 MED ORDER — HEPARIN SOD (PORK) LOCK FLUSH 100 UNIT/ML IV SOLN
250.0000 [IU] | Freq: Once | INTRAVENOUS | Status: DC | PRN
Start: 2024-02-27 — End: 2024-02-27

## 2024-02-27 MED ORDER — SODIUM CHLORIDE 0.9 % IV BOLUS
1000.0000 mL | Freq: Once | INTRAVENOUS | Status: AC
  Administered 2024-02-27: 1000 mL via INTRAVENOUS
  Filled 2024-02-27: qty 1000

## 2024-02-27 MED ORDER — HEPARIN SOD (PORK) LOCK FLUSH 100 UNIT/ML IV SOLN
500.0000 [IU] | Freq: Once | INTRAVENOUS | Status: AC | PRN
  Administered 2024-02-27: 500 [IU]
  Filled 2024-02-27: qty 5

## 2024-02-27 NOTE — Progress Notes (Signed)
 Diagnosis: Dehydration  Provider:  Mannam, Praveen MD  Procedure: IV Infusion  IV Type: Port a Cath, IV Location: R Chest  Normal Saline, Dose: 1000 ml  Infusion Start Time: 0827  Infusion Stop Time: 0933  Post Infusion IV Care: Port a Cath Deaccessed/Flushed and heparin  locked  Discharge: Condition: Stable, Destination: Home . AVS Declined  Performed by:  Rocky FORBES Sar, RN

## 2024-02-29 ENCOUNTER — Ambulatory Visit

## 2024-02-29 VITALS — BP 166/94 | HR 83 | Temp 97.3°F | Resp 18 | Ht 63.0 in | Wt 182.0 lb

## 2024-02-29 DIAGNOSIS — E860 Dehydration: Secondary | ICD-10-CM | POA: Diagnosis not present

## 2024-02-29 DIAGNOSIS — Z452 Encounter for adjustment and management of vascular access device: Secondary | ICD-10-CM | POA: Diagnosis not present

## 2024-02-29 MED ORDER — HEPARIN SOD (PORK) LOCK FLUSH 100 UNIT/ML IV SOLN
500.0000 [IU] | Freq: Once | INTRAVENOUS | Status: AC | PRN
  Administered 2024-02-29: 500 [IU]

## 2024-02-29 MED ORDER — SODIUM CHLORIDE 0.9 % IV BOLUS
1000.0000 mL | Freq: Once | INTRAVENOUS | Status: AC
  Administered 2024-02-29: 1000 mL via INTRAVENOUS
  Filled 2024-02-29: qty 1000

## 2024-02-29 NOTE — Progress Notes (Signed)
 Diagnosis: Dehydration  Provider:  Praveen Mannam MD  Procedure: IV Infusion  IV Type: Port a Cath, IV Location: R Chest  Normal Saline, Dose: 1000 ml  Infusion Start Time: 0845  Infusion Stop Time: 1005  Post Infusion IV Care: Port a Cath Deaccessed/Flushed and heparin  locked.  Discharge: Condition: Good, Destination: Home . AVS Declined  Performed by:  Ashyra Cantin, RN

## 2024-03-02 ENCOUNTER — Ambulatory Visit (INDEPENDENT_AMBULATORY_CARE_PROVIDER_SITE_OTHER)

## 2024-03-02 VITALS — BP 145/87 | HR 86 | Temp 97.7°F | Resp 16 | Ht 63.0 in | Wt 182.6 lb

## 2024-03-02 DIAGNOSIS — E860 Dehydration: Secondary | ICD-10-CM | POA: Diagnosis not present

## 2024-03-02 MED ORDER — SODIUM CHLORIDE 0.9 % IV BOLUS
1000.0000 mL | Freq: Once | INTRAVENOUS | Status: AC
  Administered 2024-03-02: 1000 mL via INTRAVENOUS
  Filled 2024-03-02: qty 1000

## 2024-03-02 MED ORDER — HEPARIN SOD (PORK) LOCK FLUSH 100 UNIT/ML IV SOLN
500.0000 [IU] | Freq: Once | INTRAVENOUS | Status: AC | PRN
  Administered 2024-03-02: 500 [IU]

## 2024-03-02 MED ORDER — HEPARIN SOD (PORK) LOCK FLUSH 100 UNIT/ML IV SOLN: 250.0000 [IU] | Freq: Once | INTRAVENOUS | Status: DC | PRN

## 2024-03-02 NOTE — Progress Notes (Signed)
 Diagnosis: Dehydration  Provider:  Mannam, Praveen MD  Procedure: IV Infusion  IV Type: Port a Cath, IV Location: R Chest  Normal Saline, Dose: 1000 ml  Infusion Start Time: 0815  Infusion Stop Time: 0922  Post Infusion IV Care: Port a Cath Deaccessed/Flushed and heparin  locked  Discharge: Condition: Good, Destination: Home . AVS Declined  Performed by:  Rocky FORBES Sar, RN

## 2024-03-05 ENCOUNTER — Ambulatory Visit

## 2024-03-05 VITALS — BP 152/88 | HR 77 | Temp 97.8°F | Resp 16 | Ht 63.0 in | Wt 182.2 lb

## 2024-03-05 DIAGNOSIS — E860 Dehydration: Secondary | ICD-10-CM | POA: Diagnosis not present

## 2024-03-05 MED ORDER — SODIUM CHLORIDE 0.9 % IV BOLUS
1000.0000 mL | Freq: Once | INTRAVENOUS | Status: AC
  Administered 2024-03-05: 1000 mL via INTRAVENOUS
  Filled 2024-03-05: qty 1000

## 2024-03-05 MED ORDER — HEPARIN SOD (PORK) LOCK FLUSH 100 UNIT/ML IV SOLN
250.0000 [IU] | Freq: Once | INTRAVENOUS | Status: AC | PRN
  Administered 2024-03-05: 500 [IU]

## 2024-03-05 NOTE — Progress Notes (Signed)
 Diagnosis: Dehydration  Provider:  Praveen Mannam MD  Procedure: IV Infusion  IV Type: Port a Cath, IV Location: R Chest  Normal Saline, Dose: 1000 ml  Infusion Start Time: 0815  Infusion Stop Time: 0927  Post Infusion IV Care: Port a Cath Deaccessed/Flushedand heparin  locked.  Discharge: Condition: Good, Destination: Home . AVS Declined  Performed by:  Rhegan Trunnell, RN

## 2024-03-06 ENCOUNTER — Telehealth: Payer: Self-pay

## 2024-03-06 NOTE — Telephone Encounter (Signed)
 Octreotide  was coming from [facility] and then the copay was over $1000 so we tried buy/bill. Insurance isn't covering buy/bill so we need to go back to getting it from [facility]. The copay at [facility] is $1131.00. Tiffany at [facility] informed me that the patient is eligible for a payment plan with Medicare and she will need to call Medicare to set that up if she is interested. I called the patient to give her this information and had to leave a voicemail.

## 2024-03-06 NOTE — Telephone Encounter (Signed)
 Patient just returned my call, I explained this to her and she voiced understanding. She will call Medicare about the payment plan.

## 2024-03-07 ENCOUNTER — Ambulatory Visit

## 2024-03-07 VITALS — BP 144/88 | HR 86 | Temp 97.8°F | Resp 18 | Ht 63.0 in | Wt 183.2 lb

## 2024-03-07 DIAGNOSIS — E876 Hypokalemia: Secondary | ICD-10-CM | POA: Diagnosis not present

## 2024-03-07 DIAGNOSIS — E860 Dehydration: Secondary | ICD-10-CM | POA: Diagnosis not present

## 2024-03-07 DIAGNOSIS — N179 Acute kidney failure, unspecified: Secondary | ICD-10-CM | POA: Diagnosis not present

## 2024-03-07 DIAGNOSIS — N184 Chronic kidney disease, stage 4 (severe): Secondary | ICD-10-CM | POA: Diagnosis not present

## 2024-03-07 DIAGNOSIS — E88819 Insulin resistance, unspecified: Secondary | ICD-10-CM | POA: Diagnosis not present

## 2024-03-07 DIAGNOSIS — R768 Other specified abnormal immunological findings in serum: Secondary | ICD-10-CM | POA: Diagnosis not present

## 2024-03-07 DIAGNOSIS — M109 Gout, unspecified: Secondary | ICD-10-CM | POA: Diagnosis not present

## 2024-03-07 DIAGNOSIS — D631 Anemia in chronic kidney disease: Secondary | ICD-10-CM | POA: Diagnosis not present

## 2024-03-07 DIAGNOSIS — D509 Iron deficiency anemia, unspecified: Secondary | ICD-10-CM | POA: Diagnosis not present

## 2024-03-07 DIAGNOSIS — K5090 Crohn's disease, unspecified, without complications: Secondary | ICD-10-CM | POA: Diagnosis not present

## 2024-03-07 DIAGNOSIS — N2581 Secondary hyperparathyroidism of renal origin: Secondary | ICD-10-CM | POA: Diagnosis not present

## 2024-03-07 DIAGNOSIS — I129 Hypertensive chronic kidney disease with stage 1 through stage 4 chronic kidney disease, or unspecified chronic kidney disease: Secondary | ICD-10-CM | POA: Diagnosis not present

## 2024-03-07 LAB — LAB REPORT - SCANNED
Albumin, Urine POC: 4.1
Creatinine, POC: 4.12 mg/dL
EGFR: 11

## 2024-03-07 MED ORDER — SODIUM CHLORIDE 0.9 % IV BOLUS
1000.0000 mL | Freq: Once | INTRAVENOUS | Status: AC
  Administered 2024-03-07: 1000 mL via INTRAVENOUS
  Filled 2024-03-07: qty 1000

## 2024-03-07 MED ORDER — HEPARIN SOD (PORK) LOCK FLUSH 100 UNIT/ML IV SOLN
250.0000 [IU] | Freq: Once | INTRAVENOUS | Status: AC | PRN
  Administered 2024-03-07: 250 [IU]
  Filled 2024-03-07: qty 5

## 2024-03-07 NOTE — Progress Notes (Signed)
 Diagnosis: Dehydration  Provider:  Praveen Mannam MD  Procedure: IV Infusion  IV Type: Port a Cath, IV Location: R Chest  Normal Saline, Dose: 1000 ml  Infusion Start Time: 0830  Infusion Stop Time: 0940  Post Infusion IV Care: Port a Cath Deaccessed/Flushed  Discharge: Condition: Good, Destination: Home . AVS Declined  Performed by:  Donny Childes, RN

## 2024-03-08 ENCOUNTER — Ambulatory Visit

## 2024-03-08 VITALS — BP 157/74 | HR 95 | Temp 97.6°F | Resp 20 | Wt 183.6 lb

## 2024-03-08 DIAGNOSIS — E860 Dehydration: Secondary | ICD-10-CM | POA: Diagnosis not present

## 2024-03-08 MED ORDER — HEPARIN SOD (PORK) LOCK FLUSH 100 UNIT/ML IV SOLN
500.0000 [IU] | Freq: Once | INTRAVENOUS | Status: AC | PRN
  Administered 2024-03-08: 500 [IU]
  Filled 2024-03-08: qty 5

## 2024-03-08 MED ORDER — SODIUM CHLORIDE 0.9 % IV BOLUS
1000.0000 mL | Freq: Once | INTRAVENOUS | Status: AC
  Administered 2024-03-08: 1000 mL via INTRAVENOUS
  Filled 2024-03-08: qty 1000

## 2024-03-08 NOTE — Progress Notes (Signed)
 Diagnosis: Dehydration  Provider:  Praveen Mannam MD  Procedure: IV Infusion  IV Type: Port a Cath, IV Location: R Chest  Normal Saline, Dose: 1000 ml  Infusion Start Time: 0827  Infusion Stop Time: 0941  Post Infusion IV Care: Port a Cath Deaccessed/Flushed  Discharge: Condition: Good, Destination: Home . AVS Declined  Performed by:  Donny Childes, RN

## 2024-03-12 ENCOUNTER — Ambulatory Visit

## 2024-03-12 ENCOUNTER — Other Ambulatory Visit: Payer: Self-pay

## 2024-03-12 VITALS — BP 133/74 | HR 91 | Temp 97.6°F | Resp 20 | Ht 63.0 in | Wt 182.8 lb

## 2024-03-12 DIAGNOSIS — E860 Dehydration: Secondary | ICD-10-CM

## 2024-03-12 MED ORDER — HEPARIN SOD (PORK) LOCK FLUSH 100 UNIT/ML IV SOLN
500.0000 [IU] | Freq: Once | INTRAVENOUS | Status: AC | PRN
Start: 2024-03-12 — End: 2024-03-12
  Administered 2024-03-12: 500 [IU]
  Filled 2024-03-12: qty 5

## 2024-03-12 MED ORDER — HEPARIN SOD (PORK) LOCK FLUSH 100 UNIT/ML IV SOLN: 250.0000 [IU] | Freq: Once | INTRAVENOUS | Status: DC | PRN

## 2024-03-12 MED ORDER — SODIUM CHLORIDE 0.9 % IV BOLUS
1000.0000 mL | Freq: Once | INTRAVENOUS | Status: AC
  Administered 2024-03-12: 1000 mL via INTRAVENOUS
  Filled 2024-03-12: qty 1000

## 2024-03-12 NOTE — Progress Notes (Signed)
 Diagnosis: Dehydration  Provider:  Praveen Mannam MD  Procedure: IV Infusion  IV Type: Port a Cath, IV Location: R Chest  Normal Saline, Dose: 1000 ml  Infusion Start Time: 0825  Infusion Stop Time: 0933  Post Infusion IV Care: Port a Cath Deaccessed/Flushed  Discharge: Condition: Good, Destination: Home . AVS Declined  Performed by:  Donny Childes, RN

## 2024-03-13 ENCOUNTER — Telehealth: Payer: Self-pay | Admitting: Gastroenterology

## 2024-03-13 NOTE — Telephone Encounter (Signed)
 [facility] called and stated that they are needing office notes in order to be able to provide this patient with her medication. Best contact number is [phone]. Please advise.

## 2024-03-14 ENCOUNTER — Ambulatory Visit

## 2024-03-14 VITALS — BP 171/79 | HR 86 | Temp 97.5°F | Resp 18 | Ht 63.0 in | Wt 184.4 lb

## 2024-03-14 DIAGNOSIS — E860 Dehydration: Secondary | ICD-10-CM

## 2024-03-14 MED ORDER — SODIUM CHLORIDE 0.9 % IV BOLUS
1000.0000 mL | Freq: Once | INTRAVENOUS | Status: AC
  Administered 2024-03-14: 1000 mL via INTRAVENOUS
  Filled 2024-03-14: qty 1000

## 2024-03-14 MED ORDER — HEPARIN SOD (PORK) LOCK FLUSH 100 UNIT/ML IV SOLN
500.0000 [IU] | Freq: Once | INTRAVENOUS | Status: AC | PRN
  Administered 2024-03-14: 500 [IU]
  Filled 2024-03-14: qty 5

## 2024-03-14 MED ORDER — HEPARIN SOD (PORK) LOCK FLUSH 100 UNIT/ML IV SOLN
250.0000 [IU] | Freq: Once | INTRAVENOUS | Status: DC | PRN
Start: 2024-03-14 — End: 2024-03-14

## 2024-03-14 NOTE — Telephone Encounter (Signed)
 Patient gets med at infusion center. Note sent to [employer].

## 2024-03-14 NOTE — Progress Notes (Signed)
 Diagnosis: Dehydration  Provider:  Mannam, Praveen MD  Procedure: IV Infusion  IV Type: Port a Cath, IV Location: R Chest  Normal Saline, Dose: 1000 ml  Infusion Start Time: 0832  Infusion Stop Time: 0940  Post Infusion IV Care: Port a Cath Deaccessed/Flushed  Discharge: Condition: Good, Destination: Home . AVS Declined  Performed by:  Rocky FORBES Sar, RN

## 2024-03-15 NOTE — Telephone Encounter (Addendum)
 Addie from [facility] called and needed clinicals, labs and a prescription for the patients octreotide .  I sent those to him by fax to [phone] and gave a prescription as a verbal to the pharmacist. [facility] will process the octreotide  and mail it to us .  Deans phone number is [phone] ext. [phone]

## 2024-03-16 ENCOUNTER — Ambulatory Visit (INDEPENDENT_AMBULATORY_CARE_PROVIDER_SITE_OTHER)

## 2024-03-16 VITALS — BP 137/89 | HR 85 | Temp 98.1°F | Resp 16 | Ht 63.0 in | Wt 182.4 lb

## 2024-03-16 DIAGNOSIS — E860 Dehydration: Secondary | ICD-10-CM | POA: Diagnosis not present

## 2024-03-16 MED ORDER — SODIUM CHLORIDE 0.9 % IV BOLUS
1000.0000 mL | Freq: Once | INTRAVENOUS | Status: AC
  Administered 2024-03-16: 1000 mL via INTRAVENOUS
  Filled 2024-03-16: qty 1000

## 2024-03-16 MED ORDER — HEPARIN SOD (PORK) LOCK FLUSH 100 UNIT/ML IV SOLN: 250.0000 [IU] | Freq: Once | INTRAVENOUS | Status: DC | PRN

## 2024-03-16 MED ORDER — HEPARIN SOD (PORK) LOCK FLUSH 100 UNIT/ML IV SOLN
500.0000 [IU] | Freq: Once | INTRAVENOUS | Status: AC | PRN
  Administered 2024-03-16: 500 [IU]
  Filled 2024-03-16: qty 5

## 2024-03-16 NOTE — Telephone Encounter (Signed)
 Redell with [employer] is calling to have the office notes from 2/18 fax to them for patient to get ostomy supplies. [phone]

## 2024-03-16 NOTE — Telephone Encounter (Signed)
 OV note faxed as requested.

## 2024-03-16 NOTE — Progress Notes (Signed)
 Diagnosis: Dehydration  Provider:  Mannam, Praveen MD  Procedure: IV Infusion  IV Type: Port a Cath, IV Location: R Chest  Normal Saline, Dose: 1000 ml  Infusion Start Time: 0829  Infusion Stop Time: 0937  Post Infusion IV Care: Port a Cath Deaccessed/Flushed  Discharge: Condition: Good, Destination: Home . AVS Declined  Performed by:  Donny Childes, RN

## 2024-03-19 ENCOUNTER — Ambulatory Visit

## 2024-03-19 VITALS — BP 174/82 | HR 88 | Temp 97.6°F | Resp 16 | Ht 63.0 in | Wt 182.6 lb

## 2024-03-19 DIAGNOSIS — E860 Dehydration: Secondary | ICD-10-CM

## 2024-03-19 MED ORDER — SODIUM CHLORIDE 0.9 % IV BOLUS
1000.0000 mL | Freq: Once | INTRAVENOUS | Status: AC
  Administered 2024-03-19: 1000 mL via INTRAVENOUS
  Filled 2024-03-19: qty 1000

## 2024-03-19 MED ORDER — HEPARIN SOD (PORK) LOCK FLUSH 100 UNIT/ML IV SOLN
500.0000 [IU] | Freq: Once | INTRAVENOUS | Status: AC | PRN
  Administered 2024-03-19: 500 [IU]
  Filled 2024-03-19: qty 5

## 2024-03-19 NOTE — Progress Notes (Signed)
 Diagnosis: Dehydration  Provider:  Praveen Mannam MD  Procedure: IV Infusion  IV Type: Port a Cath, IV Location: R Chest  Normal Saline, Dose: 1000 ml  Infusion Start Time: 0836  Infusion Stop Time: 0942  Post Infusion IV Care: Port a Cath Deaccessed/Flushed  Discharge: Condition: Good, Destination: Home . AVS Declined  Performed by:  Donny Childes, RN

## 2024-03-20 NOTE — Telephone Encounter (Signed)
 I called [name] today to see when we could expect to receive the medication.  [name] stated that they need to speak to the patient about patient assistance before they can send us  the medication. They have not been able to contact the patient. Ph # [phone] ext. [phone]  I called the patient and left this information on her voicemail.

## 2024-03-20 NOTE — Telephone Encounter (Signed)
 [facility] is shipping the Octreotide  for delivery on 03/22/24.  Patient will be coming on 7/18 and will be able to get the injection at that appointment.

## 2024-03-21 ENCOUNTER — Ambulatory Visit

## 2024-03-21 VITALS — BP 178/83 | HR 75 | Temp 97.9°F | Resp 18 | Ht 63.0 in | Wt 182.2 lb

## 2024-03-21 DIAGNOSIS — E860 Dehydration: Secondary | ICD-10-CM

## 2024-03-21 MED ORDER — SODIUM CHLORIDE 0.9 % IV BOLUS
1000.0000 mL | Freq: Once | INTRAVENOUS | Status: AC
  Administered 2024-03-21: 1000 mL via INTRAVENOUS
  Filled 2024-03-21: qty 1000

## 2024-03-21 MED ORDER — HEPARIN SOD (PORK) LOCK FLUSH 100 UNIT/ML IV SOLN
500.0000 [IU] | Freq: Once | INTRAVENOUS | Status: AC | PRN
Start: 2024-03-21 — End: 2024-03-21
  Administered 2024-03-21: 500 [IU]

## 2024-03-21 NOTE — Progress Notes (Signed)
 Diagnosis: Dehydration  Provider:  Praveen Mannam MD  Procedure: IV Infusion  IV Type: Port a Cath, IV Location: R Chest  Normal Saline, Dose: 1000 ml  Infusion Start Time: 0827  Infusion Stop Time: 0937  Post Infusion IV Care: Port a Cath Deaccessed/Flushed and heparin  locked.  Discharge: Condition: Good, Destination: Home . AVS Declined  Performed by:  Sharonne Ricketts, RN

## 2024-03-22 NOTE — Telephone Encounter (Signed)
 Delon with [employer] called this morning and stated that there has been a delay in the delivery of Octreotide . It will now be delivered on 7/18. She will not be able to get this injection until her 7/21 appointment.

## 2024-03-23 ENCOUNTER — Ambulatory Visit

## 2024-03-23 VITALS — BP 160/83 | HR 97 | Temp 97.5°F | Resp 16 | Ht 63.0 in | Wt 183.2 lb

## 2024-03-23 DIAGNOSIS — E860 Dehydration: Secondary | ICD-10-CM

## 2024-03-23 MED ORDER — SODIUM CHLORIDE 0.9 % IV BOLUS
1000.0000 mL | Freq: Once | INTRAVENOUS | Status: AC
Start: 2024-03-23 — End: 2024-03-23
  Administered 2024-03-23: 1000 mL via INTRAVENOUS
  Filled 2024-03-23: qty 1000

## 2024-03-23 MED ORDER — SODIUM CHLORIDE 0.9% FLUSH: 10.0000 mL | Freq: Once | INTRAVENOUS | Status: DC | PRN

## 2024-03-23 MED ORDER — ALTEPLASE 2 MG IJ SOLR: 2.0000 mg | Freq: Once | INTRAMUSCULAR | Status: DC | PRN

## 2024-03-23 MED ORDER — HEPARIN SOD (PORK) LOCK FLUSH 100 UNIT/ML IV SOLN
500.0000 [IU] | Freq: Once | INTRAVENOUS | Status: AC | PRN
Start: 2024-03-23 — End: 2024-03-23
  Administered 2024-03-23: 500 [IU]
  Filled 2024-03-23: qty 5

## 2024-03-23 MED ORDER — HEPARIN SOD (PORK) LOCK FLUSH 100 UNIT/ML IV SOLN: 250.0000 [IU] | Freq: Once | INTRAVENOUS | Status: DC | PRN

## 2024-03-23 MED ORDER — SODIUM CHLORIDE 0.9% FLUSH: 3.0000 mL | Freq: Once | INTRAVENOUS | Status: DC | PRN

## 2024-03-23 MED ORDER — ANTICOAGULANT SODIUM CITRATE 4% (200MG/5ML) IV SOLN: 5.0000 mL | Freq: Once | Status: DC | PRN

## 2024-03-23 NOTE — Progress Notes (Signed)
 Diagnosis: Dehydration  Provider:  Mannam, Praveen MD  Procedure: IV Infusion  IV Type: Port a Cath, IV Location: R Chest  Normal Saline, Dose: 1000 ml  Infusion Start Time: 0822  Infusion Stop Time: 0937  Post Infusion IV Care: Port a Cath Deaccessed/Flushed and heparin  locked  Discharge: Condition: Good, Destination: Home . AVS Declined  Performed by:  Rocky FORBES Sar, RN

## 2024-03-26 ENCOUNTER — Ambulatory Visit

## 2024-03-26 VITALS — BP 153/78 | HR 83 | Temp 97.8°F | Resp 16 | Ht 63.0 in | Wt 183.2 lb

## 2024-03-26 DIAGNOSIS — E860 Dehydration: Secondary | ICD-10-CM

## 2024-03-26 DIAGNOSIS — Z932 Ileostomy status: Secondary | ICD-10-CM | POA: Diagnosis not present

## 2024-03-26 DIAGNOSIS — K50819 Crohn's disease of both small and large intestine with unspecified complications: Secondary | ICD-10-CM

## 2024-03-26 DIAGNOSIS — R198 Other specified symptoms and signs involving the digestive system and abdomen: Secondary | ICD-10-CM | POA: Diagnosis not present

## 2024-03-26 MED ORDER — OCTREOTIDE ACETATE 20 MG IM KIT
20.0000 mg | PACK | Freq: Once | INTRAMUSCULAR | Status: AC
Start: 2024-03-26 — End: 2024-03-26
  Administered 2024-03-26: 20 mg via INTRAMUSCULAR
  Filled 2024-03-26: qty 1

## 2024-03-26 MED ORDER — HEPARIN SOD (PORK) LOCK FLUSH 100 UNIT/ML IV SOLN
500.0000 [IU] | Freq: Once | INTRAVENOUS | Status: AC | PRN
  Administered 2024-03-26: 500 [IU]
  Filled 2024-03-26: qty 5

## 2024-03-26 MED ORDER — SODIUM CHLORIDE 0.9 % IV BOLUS
1000.0000 mL | Freq: Once | INTRAVENOUS | Status: AC
  Administered 2024-03-26: 1000 mL via INTRAVENOUS
  Filled 2024-03-26: qty 1000

## 2024-03-26 NOTE — Progress Notes (Signed)
 Diagnosis: Dehydration  Provider:  Mannam, Praveen MD  Procedure: IV Infusion  IV Type: Port a Cath, IV Location: R Chest  Normal Saline, Dose: 1000 ml  Infusion Start Time: 0830  Infusion Stop Time: 0938  Post Infusion IV Care: Port a Cath Deaccessed/Flushed and heparin  locked  Discharge: Condition: Good, Destination: Home . AVS Declined    Diagnosis: Crohn's Disease  Provider:  Mannam, Praveen MD  Procedure: Injection  Sandostatin  LAR (octreotide ), Dose: 20 mg, Site: intramuscular, Number of injections: 1  Injection Site(s): Right upper quad. gluteus  Post Care: Patient declined observation  Discharge: Condition: Good, Destination: Home . AVS Declined    Performed by:  Rocky FORBES Sar, RN

## 2024-03-28 ENCOUNTER — Ambulatory Visit

## 2024-03-28 VITALS — BP 124/68 | HR 100 | Temp 97.8°F | Resp 20 | Ht 63.0 in | Wt 183.2 lb

## 2024-03-28 DIAGNOSIS — E860 Dehydration: Secondary | ICD-10-CM | POA: Diagnosis not present

## 2024-03-28 MED ORDER — HEPARIN SOD (PORK) LOCK FLUSH 100 UNIT/ML IV SOLN
500.0000 [IU] | Freq: Once | INTRAVENOUS | Status: AC | PRN
  Administered 2024-03-28: 500 [IU]

## 2024-03-28 MED ORDER — SODIUM CHLORIDE 0.9 % IV BOLUS
1000.0000 mL | Freq: Once | INTRAVENOUS | Status: AC
  Administered 2024-03-28: 1000 mL via INTRAVENOUS
  Filled 2024-03-28: qty 1000

## 2024-03-28 NOTE — Progress Notes (Signed)
Diagnosis: Dehydration  Provider:  Chilton Greathouse MD  Procedure: IV Infusion  IV Type: Port a Cath, IV Location: R Chest  Normal Saline, Dose: 1000 ml  Infusion Start Time: 0825  Infusion Stop Time: 0935  Post Infusion IV Care: Port a Cath Deaccessed/Flushed  Discharge: Condition: Good, Destination: Home . AVS Declined  Performed by:  Nat Math, RN

## 2024-03-30 ENCOUNTER — Ambulatory Visit

## 2024-03-30 VITALS — BP 143/79 | HR 83 | Temp 97.8°F | Resp 20 | Ht 63.0 in | Wt 181.6 lb

## 2024-03-30 DIAGNOSIS — E860 Dehydration: Secondary | ICD-10-CM | POA: Diagnosis not present

## 2024-03-30 MED ORDER — SODIUM CHLORIDE 0.9 % IV BOLUS
1000.0000 mL | Freq: Once | INTRAVENOUS | Status: AC
  Administered 2024-03-30: 1000 mL via INTRAVENOUS
  Filled 2024-03-30: qty 1000

## 2024-03-30 MED ORDER — HEPARIN SOD (PORK) LOCK FLUSH 100 UNIT/ML IV SOLN
500.0000 [IU] | Freq: Once | INTRAVENOUS | Status: AC | PRN
  Administered 2024-03-30: 500 [IU]
  Filled 2024-03-30: qty 5

## 2024-03-30 NOTE — Progress Notes (Signed)
 Diagnosis: Dehydration  Provider:  Praveen Mannam MD  Procedure: IV Infusion  IV Type: Port a Cath, IV Location: R Chest  Normal Saline, Dose: 1000 ml  Infusion Start Time: 0826  Infusion Stop Time: 0933  Post Infusion IV Care: Port a Cath Deaccessed/Flushed  Discharge: Condition: Good, Destination: Home . AVS Declined  Performed by:  Donny Childes, RN

## 2024-04-01 ENCOUNTER — Other Ambulatory Visit: Payer: Self-pay | Admitting: Family Medicine

## 2024-04-01 DIAGNOSIS — F322 Major depressive disorder, single episode, severe without psychotic features: Secondary | ICD-10-CM

## 2024-04-02 ENCOUNTER — Ambulatory Visit (INDEPENDENT_AMBULATORY_CARE_PROVIDER_SITE_OTHER)

## 2024-04-02 VITALS — BP 149/78 | HR 97 | Temp 97.6°F | Resp 18 | Ht 63.0 in | Wt 181.0 lb

## 2024-04-02 DIAGNOSIS — E860 Dehydration: Secondary | ICD-10-CM | POA: Diagnosis not present

## 2024-04-02 MED ORDER — HEPARIN SOD (PORK) LOCK FLUSH 100 UNIT/ML IV SOLN
500.0000 [IU] | Freq: Once | INTRAVENOUS | Status: AC | PRN
Start: 2024-04-02 — End: 2024-04-02
  Administered 2024-04-02: 500 [IU]
  Filled 2024-04-02: qty 5

## 2024-04-02 MED ORDER — SODIUM CHLORIDE 0.9 % IV BOLUS
1000.0000 mL | Freq: Once | INTRAVENOUS | Status: AC
  Administered 2024-04-02: 1000 mL via INTRAVENOUS
  Filled 2024-04-02: qty 1000

## 2024-04-02 NOTE — Progress Notes (Signed)
 Diagnosis: Dehydration  Provider:  Praveen Mannam MD  Procedure: IV Infusion  IV Type: Port a Cath, IV Location: R Chest  Normal Saline, Dose: 1000 ml  Infusion Start Time: 0827  Infusion Stop Time: 0936  Post Infusion IV Care: Port a Cath Deaccessed/Flushed and heparin  locked.  Discharge: Condition: Good, Destination: Home . AVS Declined  Performed by:  Johnay Mano, RN

## 2024-04-04 ENCOUNTER — Ambulatory Visit (INDEPENDENT_AMBULATORY_CARE_PROVIDER_SITE_OTHER)

## 2024-04-04 VITALS — BP 158/84 | HR 94 | Temp 98.0°F | Resp 16 | Ht 63.0 in

## 2024-04-04 DIAGNOSIS — E860 Dehydration: Secondary | ICD-10-CM | POA: Diagnosis not present

## 2024-04-04 MED ORDER — SODIUM CHLORIDE 0.9 % IV BOLUS
1000.0000 mL | Freq: Once | INTRAVENOUS | Status: AC
  Administered 2024-04-04: 1000 mL via INTRAVENOUS
  Filled 2024-04-04: qty 1000

## 2024-04-04 MED ORDER — HEPARIN SOD (PORK) LOCK FLUSH 100 UNIT/ML IV SOLN
500.0000 [IU] | Freq: Once | INTRAVENOUS | Status: AC | PRN
Start: 2024-04-04 — End: 2024-04-04
  Administered 2024-04-04: 500 [IU]
  Filled 2024-04-04: qty 5

## 2024-04-04 MED ORDER — HEPARIN SOD (PORK) LOCK FLUSH 100 UNIT/ML IV SOLN
250.0000 [IU] | Freq: Once | INTRAVENOUS | Status: DC | PRN
Start: 2024-04-04 — End: 2024-04-04

## 2024-04-04 NOTE — Progress Notes (Signed)
 Diagnosis: Dehydration  Provider:  Mannam, Praveen MD  Procedure: IV Infusion  IV Type: Port a Cath, IV Location: R Chest  Normal Saline, Dose: 1000 ml  Infusion Start Time: 0827  Infusion Stop Time: 0936  Post Infusion IV Care: Port a Cath Deaccessed/Flushed and heparin  locked  Discharge: Condition: Good, Destination: Home . AVS Declined  Performed by:  Rocky FORBES Sar, RN

## 2024-04-06 ENCOUNTER — Ambulatory Visit

## 2024-04-06 VITALS — BP 125/76 | HR 98 | Temp 97.8°F | Resp 20 | Ht 63.0 in | Wt 182.4 lb

## 2024-04-06 DIAGNOSIS — E860 Dehydration: Secondary | ICD-10-CM | POA: Diagnosis not present

## 2024-04-06 MED ORDER — HEPARIN SOD (PORK) LOCK FLUSH 100 UNIT/ML IV SOLN
500.0000 [IU] | Freq: Once | INTRAVENOUS | Status: AC | PRN
  Administered 2024-04-06: 500 [IU]
  Filled 2024-04-06: qty 5

## 2024-04-06 MED ORDER — SODIUM CHLORIDE 0.9 % IV BOLUS
1000.0000 mL | Freq: Once | INTRAVENOUS | Status: AC
  Administered 2024-04-06: 1000 mL via INTRAVENOUS
  Filled 2024-04-06: qty 1000

## 2024-04-06 MED ORDER — HEPARIN SOD (PORK) LOCK FLUSH 100 UNIT/ML IV SOLN: 250.0000 [IU] | Freq: Once | INTRAVENOUS | Status: DC | PRN

## 2024-04-06 NOTE — Progress Notes (Signed)
 Diagnosis: Dehydration  Provider:  Praveen Mannam MD  Procedure: IV Infusion  IV Type: Port a Cath, IV Location: R Chest  Normal Saline, Dose: 1000 ml  Infusion Start Time: 0819  Infusion Stop Time: 0925  Post Infusion IV Care: Port a Cath Deaccessed/Flushed  Discharge: Condition: Good, Destination: Home . AVS Declined  Performed by:  Donny Childes, RN

## 2024-04-09 ENCOUNTER — Ambulatory Visit

## 2024-04-09 VITALS — BP 135/81 | HR 85 | Temp 97.6°F | Resp 18 | Ht 63.0 in | Wt 180.6 lb

## 2024-04-09 DIAGNOSIS — E860 Dehydration: Secondary | ICD-10-CM | POA: Diagnosis not present

## 2024-04-09 MED ORDER — SODIUM CHLORIDE 0.9 % IV BOLUS
1000.0000 mL | Freq: Once | INTRAVENOUS | Status: AC
  Administered 2024-04-09: 1000 mL via INTRAVENOUS
  Filled 2024-04-09: qty 1000

## 2024-04-09 MED ORDER — HEPARIN SOD (PORK) LOCK FLUSH 100 UNIT/ML IV SOLN
500.0000 [IU] | Freq: Once | INTRAVENOUS | Status: AC | PRN
Start: 2024-04-09 — End: 2024-04-09
  Administered 2024-04-09: 500 [IU]
  Filled 2024-04-09: qty 5

## 2024-04-09 NOTE — Progress Notes (Signed)
 Diagnosis: Dehydration  Provider:  Praveen Mannam MD  Procedure: IV Infusion  IV Type: Port a Cath, IV Location: R Chest  Normal Saline, Dose: 1000 ml  Infusion Start Time: 0826  Infusion Stop Time: 0930  Post Infusion IV Care: Port a Cath Deaccessed/Flushed, Heparin  locked .  Discharge: Condition: Good, Destination: Home . AVS Declined  Performed by:  Kolson Chovanec, RN

## 2024-04-11 ENCOUNTER — Ambulatory Visit

## 2024-04-11 VITALS — BP 110/71 | HR 101 | Temp 97.7°F | Resp 20 | Ht 63.0 in | Wt 180.4 lb

## 2024-04-11 DIAGNOSIS — E860 Dehydration: Secondary | ICD-10-CM

## 2024-04-11 MED ORDER — HEPARIN SOD (PORK) LOCK FLUSH 100 UNIT/ML IV SOLN
500.0000 [IU] | Freq: Once | INTRAVENOUS | Status: DC | PRN
Start: 2024-04-11 — End: 2024-04-11

## 2024-04-11 MED ORDER — SODIUM CHLORIDE 0.9 % IV BOLUS
1000.0000 mL | Freq: Once | INTRAVENOUS | Status: DC
  Filled 2024-04-11: qty 1000

## 2024-04-11 MED ORDER — HEPARIN SOD (PORK) LOCK FLUSH 100 UNIT/ML IV SOLN
250.0000 [IU] | Freq: Once | INTRAVENOUS | Status: DC | PRN
Start: 2024-04-11 — End: 2024-04-11

## 2024-04-11 NOTE — Progress Notes (Signed)
 Pt here for infusion, had family emergency and left without treatment.pt is scheduled for infusion tomorrow pre her request.

## 2024-04-12 ENCOUNTER — Ambulatory Visit (INDEPENDENT_AMBULATORY_CARE_PROVIDER_SITE_OTHER)

## 2024-04-12 VITALS — BP 133/78 | HR 96 | Temp 97.9°F | Resp 18 | Ht 63.0 in | Wt 180.8 lb

## 2024-04-12 DIAGNOSIS — E860 Dehydration: Secondary | ICD-10-CM

## 2024-04-12 MED ORDER — HEPARIN SOD (PORK) LOCK FLUSH 100 UNIT/ML IV SOLN
500.0000 [IU] | Freq: Once | INTRAVENOUS | Status: AC | PRN
Start: 2024-04-12 — End: 2024-04-12
  Administered 2024-04-12: 500 [IU]
  Filled 2024-04-12: qty 5

## 2024-04-12 MED ORDER — HEPARIN SOD (PORK) LOCK FLUSH 100 UNIT/ML IV SOLN
250.0000 [IU] | Freq: Once | INTRAVENOUS | Status: DC | PRN
Start: 2024-04-12 — End: 2024-04-12

## 2024-04-12 MED ORDER — SODIUM CHLORIDE 0.9 % IV BOLUS
1000.0000 mL | Freq: Once | INTRAVENOUS | Status: AC
Start: 2024-04-12 — End: 2024-04-12
  Administered 2024-04-12: 1000 mL via INTRAVENOUS
  Filled 2024-04-12: qty 1000

## 2024-04-12 NOTE — Progress Notes (Signed)
 Diagnosis: Dehydration  Provider:  Praveen Mannam MD  Procedure: IV Infusion  IV Type: Port a Cath, IV Location: R Chest  Normal Saline, Dose: 1000 ml  Infusion Start Time: 0832  Infusion Stop Time: 0945  Post Infusion IV Care: Port a Cath Deaccessed/Flushed and heparin  locked.  Discharge: Condition: Good, Destination: Home . AVS Declined  Performed by:  Trey Bebee, RN

## 2024-04-13 ENCOUNTER — Ambulatory Visit

## 2024-04-13 VITALS — BP 133/75 | HR 84 | Temp 97.6°F | Resp 20 | Ht 63.0 in | Wt 181.0 lb

## 2024-04-13 DIAGNOSIS — E860 Dehydration: Secondary | ICD-10-CM

## 2024-04-13 MED ORDER — HEPARIN SOD (PORK) LOCK FLUSH 100 UNIT/ML IV SOLN
500.0000 [IU] | Freq: Once | INTRAVENOUS | Status: AC | PRN
  Administered 2024-04-13: 500 [IU]
  Filled 2024-04-13: qty 5

## 2024-04-13 MED ORDER — HEPARIN SOD (PORK) LOCK FLUSH 100 UNIT/ML IV SOLN: 250.0000 [IU] | Freq: Once | INTRAVENOUS | Status: DC | PRN

## 2024-04-13 MED ORDER — SODIUM CHLORIDE 0.9 % IV BOLUS
1000.0000 mL | Freq: Once | INTRAVENOUS | Status: AC
Start: 2024-04-13 — End: 2024-04-13
  Administered 2024-04-13: 1000 mL via INTRAVENOUS
  Filled 2024-04-13: qty 1000

## 2024-04-13 NOTE — Progress Notes (Signed)
 Diagnosis: Dehydration  Provider:  Praveen Mannam MD  Procedure: IV Infusion  IV Type: Port a Cath, IV Location: R Chest  Normal Saline, Dose: 1000 ml  Infusion Start Time: 0843  Infusion Stop Time: 0950  Post Infusion IV Care: Port a Cath Deaccessed/Flushed and heparin  locked.  Discharge: Condition: Good, Destination: Home . AVS Declined  Performed by:  Dishawn Bhargava, RN

## 2024-04-16 ENCOUNTER — Ambulatory Visit

## 2024-04-16 VITALS — BP 138/79 | HR 77 | Temp 97.7°F | Resp 18 | Ht 63.0 in | Wt 180.0 lb

## 2024-04-16 DIAGNOSIS — E860 Dehydration: Secondary | ICD-10-CM | POA: Diagnosis not present

## 2024-04-16 MED ORDER — HEPARIN SOD (PORK) LOCK FLUSH 100 UNIT/ML IV SOLN
500.0000 [IU] | Freq: Once | INTRAVENOUS | Status: AC | PRN
  Administered 2024-04-16 (×2): 500 [IU]

## 2024-04-16 MED ORDER — SODIUM CHLORIDE 0.9 % IV BOLUS
1000.0000 mL | Freq: Once | INTRAVENOUS | Status: AC
  Administered 2024-04-16 (×2): 1000 mL via INTRAVENOUS
  Filled 2024-04-16: qty 1000

## 2024-04-16 NOTE — Progress Notes (Signed)
 Diagnosis: Dehydration  Provider:  Praveen Mannam MD  Procedure: IV Infusion  IV Type: Port a Cath, IV Location: R Chest  Normal Saline, Dose: 1000 ml  Infusion Start Time: 0833  Infusion Stop Time: 0940  Post Infusion IV Care: Port a Cath Deaccessed/Flushed, Heparin  locked  Discharge: Condition: Good, Destination: Home . AVS Declined  Performed by:  Noor Witte, RN

## 2024-04-18 ENCOUNTER — Ambulatory Visit

## 2024-04-18 VITALS — BP 189/84 | HR 101 | Temp 97.7°F | Resp 20 | Ht 63.0 in | Wt 178.6 lb

## 2024-04-18 DIAGNOSIS — E860 Dehydration: Secondary | ICD-10-CM | POA: Diagnosis not present

## 2024-04-18 MED ORDER — HEPARIN SOD (PORK) LOCK FLUSH 100 UNIT/ML IV SOLN
500.0000 [IU] | Freq: Once | INTRAVENOUS | Status: AC | PRN
  Administered 2024-04-18 (×2): 500 [IU]
  Filled 2024-04-18: qty 5

## 2024-04-18 MED ORDER — SODIUM CHLORIDE 0.9 % IV BOLUS
1000.0000 mL | Freq: Once | INTRAVENOUS | Status: AC
  Administered 2024-04-18 (×2): 1000 mL via INTRAVENOUS
  Filled 2024-04-18: qty 1000

## 2024-04-18 NOTE — Progress Notes (Signed)
 Diagnosis: Dehydration  Provider:  Mannam, Praveen MD  Procedure: IV Infusion  IV Type: Port a Cath, IV Location: R Chest  Normal Saline, Dose: 1000 ml  Infusion Start Time: 0841  Infusion Stop Time: 0950  Post Infusion IV Care: Port a Cath Deaccessed/Flushed and heparin  locked  Discharge: Condition: Good, Destination: Home . AVS Declined  Performed by:  Rocky FORBES Sar, RN

## 2024-04-20 ENCOUNTER — Ambulatory Visit (INDEPENDENT_AMBULATORY_CARE_PROVIDER_SITE_OTHER)

## 2024-04-20 VITALS — BP 159/93 | HR 84 | Temp 97.6°F | Resp 20 | Ht 63.0 in | Wt 179.0 lb

## 2024-04-20 DIAGNOSIS — E860 Dehydration: Secondary | ICD-10-CM

## 2024-04-20 MED ORDER — HEPARIN SOD (PORK) LOCK FLUSH 100 UNIT/ML IV SOLN
500.0000 [IU] | Freq: Once | INTRAVENOUS | Status: AC | PRN
  Administered 2024-04-20: 500 [IU]

## 2024-04-20 MED ORDER — HEPARIN SOD (PORK) LOCK FLUSH 100 UNIT/ML IV SOLN: 250.0000 [IU] | Freq: Once | INTRAVENOUS | Status: DC | PRN

## 2024-04-20 MED ORDER — SODIUM CHLORIDE 0.9 % IV BOLUS
1000.0000 mL | Freq: Once | INTRAVENOUS | Status: AC
  Administered 2024-04-20: 1000 mL via INTRAVENOUS
  Filled 2024-04-20: qty 1000

## 2024-04-20 NOTE — Progress Notes (Signed)
 Diagnosis: Dehydration  Provider:  Mannam, Praveen MD  Procedure: IV Infusion  IV Type: Port a Cath, IV Location: R Chest  Normal Saline, Dose: 1000 ml  Infusion Start Time: 0857  Infusion Stop Time: 1004  Post Infusion IV Care: Patient declined observation and Port a Cath Deaccessed/Flushed  Discharge: Condition: Good, Destination: Home . AVS Provided  Performed by:  Yee Joss, RN

## 2024-04-23 ENCOUNTER — Ambulatory Visit

## 2024-04-23 VITALS — BP 127/78 | HR 96 | Temp 97.5°F | Resp 18 | Ht 63.0 in | Wt 179.2 lb

## 2024-04-23 DIAGNOSIS — E860 Dehydration: Secondary | ICD-10-CM

## 2024-04-23 DIAGNOSIS — R198 Other specified symptoms and signs involving the digestive system and abdomen: Secondary | ICD-10-CM

## 2024-04-23 DIAGNOSIS — K50819 Crohn's disease of both small and large intestine with unspecified complications: Secondary | ICD-10-CM

## 2024-04-23 MED ORDER — HEPARIN SOD (PORK) LOCK FLUSH 100 UNIT/ML IV SOLN
250.0000 [IU] | Freq: Once | INTRAVENOUS | Status: DC | PRN
Start: 2024-04-23 — End: 2024-04-23

## 2024-04-23 MED ORDER — HEPARIN SOD (PORK) LOCK FLUSH 100 UNIT/ML IV SOLN
500.0000 [IU] | Freq: Once | INTRAVENOUS | Status: AC | PRN
  Administered 2024-04-23: 500 [IU]
  Filled 2024-04-23: qty 5

## 2024-04-23 MED ORDER — SODIUM CHLORIDE 0.9 % IV BOLUS
1000.0000 mL | Freq: Once | INTRAVENOUS | Status: AC
  Administered 2024-04-23: 1000 mL via INTRAVENOUS
  Filled 2024-04-23: qty 1000

## 2024-04-23 MED ORDER — OCTREOTIDE ACETATE 20 MG IM KIT
20.0000 mg | PACK | Freq: Once | INTRAMUSCULAR | Status: DC
  Filled 2024-04-23: qty 1

## 2024-04-23 NOTE — Progress Notes (Signed)
 Diagnosis: Dehydration  Provider:  Praveen Mannam MD  Procedure: IV Infusion  IV Type: Port a Cath, IV Location: R Chest  Normal Saline, Dose: 1000 ml  Infusion Start Time: 0850  Infusion Stop Time: 0950  Post Infusion IV Care: Port a Cath Deaccessed/Flushed Heparin  flushed  Discharge: Condition: Good, Destination: Home . AVS Declined  Performed by:  Marshal Schrecengost, RN

## 2024-04-24 ENCOUNTER — Other Ambulatory Visit: Payer: Self-pay | Admitting: Family Medicine

## 2024-04-24 DIAGNOSIS — F322 Major depressive disorder, single episode, severe without psychotic features: Secondary | ICD-10-CM

## 2024-04-25 ENCOUNTER — Ambulatory Visit (INDEPENDENT_AMBULATORY_CARE_PROVIDER_SITE_OTHER)

## 2024-04-25 VITALS — BP 125/84 | HR 81 | Temp 97.9°F | Resp 18 | Ht 63.0 in | Wt 179.2 lb

## 2024-04-25 DIAGNOSIS — K50819 Crohn's disease of both small and large intestine with unspecified complications: Secondary | ICD-10-CM | POA: Diagnosis not present

## 2024-04-25 DIAGNOSIS — E860 Dehydration: Secondary | ICD-10-CM

## 2024-04-25 DIAGNOSIS — R198 Other specified symptoms and signs involving the digestive system and abdomen: Secondary | ICD-10-CM

## 2024-04-25 MED ORDER — OCTREOTIDE ACETATE 20 MG IM KIT
20.0000 mg | PACK | Freq: Once | INTRAMUSCULAR | Status: AC
  Administered 2024-04-25: 20 mg via INTRAMUSCULAR
  Filled 2024-04-25: qty 1

## 2024-04-25 MED ORDER — HEPARIN SOD (PORK) LOCK FLUSH 100 UNIT/ML IV SOLN
500.0000 [IU] | Freq: Once | INTRAVENOUS | Status: AC | PRN
  Administered 2024-04-25: 500 [IU]
  Filled 2024-04-25: qty 5

## 2024-04-25 MED ORDER — SODIUM CHLORIDE 0.9 % IV BOLUS
1000.0000 mL | Freq: Once | INTRAVENOUS | Status: AC
  Administered 2024-04-25: 1000 mL via INTRAVENOUS
  Filled 2024-04-25: qty 1000

## 2024-04-25 NOTE — Progress Notes (Signed)
 Diagnosis: Crohn's Disease  Provider:  Mannam, Praveen MD  Procedure: Injection  Octreotide , Dose: 20 mg, Site: intramuscular, Number of injections: 1  Injection Site(s): Right upper quad. gluteus  Post Care: Observation period completed  Discharge: Condition: Good, Destination: Home . AVS Declined  Performed by:  Goodwin Kamphaus, RN      Diagnosis: Dehydration  Provider:  Praveen Mannam MD  Procedure: IV Infusion  IV Type: Port a Cath, IV Location: R Chest  Normal Saline, Dose: 1000 ml  Infusion Start Time: 0833  Infusion Stop Time: 0949  Post Infusion IV Care: Port a Cath Deaccessed/Flushed and heparin  locked.  Discharge: Condition: Good, Destination: Home . AVS Declined  Performed by:  Trasean Delima, RN

## 2024-04-27 ENCOUNTER — Ambulatory Visit

## 2024-04-27 VITALS — BP 145/83 | HR 113 | Temp 98.3°F | Resp 20 | Ht 63.0 in | Wt 178.6 lb

## 2024-04-27 DIAGNOSIS — E860 Dehydration: Secondary | ICD-10-CM

## 2024-04-27 MED ORDER — SODIUM CHLORIDE 0.9 % IV BOLUS
1000.0000 mL | Freq: Once | INTRAVENOUS | Status: AC
  Administered 2024-04-27: 1000 mL via INTRAVENOUS
  Filled 2024-04-27: qty 1000

## 2024-04-27 MED ORDER — HEPARIN SOD (PORK) LOCK FLUSH 100 UNIT/ML IV SOLN
500.0000 [IU] | Freq: Once | INTRAVENOUS | Status: AC | PRN
  Administered 2024-04-27: 500 [IU]
  Filled 2024-04-27: qty 5

## 2024-04-27 NOTE — Progress Notes (Signed)
 Diagnosis: Dehydration  Provider:  Praveen Mannam MD  Procedure: IV Infusion  IV Type: Port a Cath, IV Location: R Chest  Normal Saline, Dose: 1000 ml  Infusion Start Time: 0843  Infusion Stop Time: 0950  Post Infusion IV Care: Port a Cath Deaccessed/Flushed Heparin  locked  Discharge: Condition: Good, Destination: Home . AVS Declined  Performed by:  Mintie Witherington, RN

## 2024-04-30 ENCOUNTER — Encounter: Payer: Self-pay | Admitting: Gastroenterology

## 2024-04-30 ENCOUNTER — Encounter: Payer: Self-pay | Admitting: Nephrology

## 2024-04-30 ENCOUNTER — Ambulatory Visit

## 2024-04-30 ENCOUNTER — Encounter: Payer: Self-pay | Admitting: Family

## 2024-04-30 MED ORDER — HEPARIN SOD (PORK) LOCK FLUSH 100 UNIT/ML IV SOLN
500.0000 [IU] | Freq: Once | INTRAVENOUS | Status: DC | PRN
Start: 2024-04-30 — End: 2024-06-11

## 2024-04-30 MED ORDER — HEPARIN SOD (PORK) LOCK FLUSH 100 UNIT/ML IV SOLN
250.0000 [IU] | Freq: Once | INTRAVENOUS | Status: DC | PRN
Start: 2024-04-30 — End: 2024-06-11

## 2024-04-30 MED ORDER — SODIUM CHLORIDE 0.9 % IV BOLUS
1000.0000 mL | Freq: Once | INTRAVENOUS | Status: DC
  Filled 2024-04-30: qty 1000

## 2024-05-01 ENCOUNTER — Ambulatory Visit

## 2024-05-01 VITALS — BP 137/79 | HR 89 | Temp 98.0°F | Resp 18 | Ht 63.0 in

## 2024-05-01 DIAGNOSIS — E860 Dehydration: Secondary | ICD-10-CM

## 2024-05-01 MED ORDER — SODIUM CHLORIDE 0.9 % IV BOLUS
1000.0000 mL | Freq: Once | INTRAVENOUS | Status: AC
  Administered 2024-05-01: 1000 mL via INTRAVENOUS
  Filled 2024-05-01: qty 1000

## 2024-05-01 MED ORDER — HEPARIN SOD (PORK) LOCK FLUSH 100 UNIT/ML IV SOLN
500.0000 [IU] | Freq: Once | INTRAVENOUS | Status: AC | PRN
  Administered 2024-05-01: 500 [IU]
  Filled 2024-05-01: qty 5

## 2024-05-01 NOTE — Progress Notes (Signed)
 Diagnosis: Dehydration  Provider:  Mannam, Praveen MD  Procedure: IV Infusion  IV Type: Port a Cath, IV Location: R Chest  Normal Saline, Dose: 1000 ml  Infusion Start Time: 1302  Infusion Stop Time: 1409  Post Infusion IV Care: Port a Cath Deaccessed/Flushed and heparin  locked  Discharge: Condition: Good, Destination: Home . AVS Declined  Performed by:  Rocky FORBES Sar, RN

## 2024-05-02 ENCOUNTER — Ambulatory Visit

## 2024-05-02 VITALS — BP 176/97 | HR 98 | Temp 97.6°F | Resp 20 | Ht 63.0 in | Wt 177.8 lb

## 2024-05-02 DIAGNOSIS — E860 Dehydration: Secondary | ICD-10-CM | POA: Diagnosis not present

## 2024-05-02 MED ORDER — HEPARIN SOD (PORK) LOCK FLUSH 100 UNIT/ML IV SOLN
500.0000 [IU] | Freq: Once | INTRAVENOUS | Status: AC | PRN
  Administered 2024-05-02: 500 [IU]
  Filled 2024-05-02: qty 5

## 2024-05-02 MED ORDER — SODIUM CHLORIDE 0.9 % IV BOLUS
1000.0000 mL | Freq: Once | INTRAVENOUS | Status: AC
  Administered 2024-05-02: 1000 mL via INTRAVENOUS
  Filled 2024-05-02: qty 1000

## 2024-05-02 MED ORDER — HEPARIN SOD (PORK) LOCK FLUSH 100 UNIT/ML IV SOLN: 250.0000 [IU] | Freq: Once | INTRAVENOUS | Status: DC | PRN

## 2024-05-02 NOTE — Progress Notes (Signed)
 Diagnosis: Dehydration  Provider:  Mannam, Praveen MD  Procedure: IV Infusion  IV Type: Port a Cath, IV Location: R Chest  Normal Saline, Dose: 1000 ml  Infusion Start Time: 0831  Infusion Stop Time: 0938  Post Infusion IV Care: Port a Cath Deaccessed/Flushed and heparin  locked  Discharge: Condition: Good, Destination: Home . AVS Declined  Performed by:  Rocky FORBES Sar, RN

## 2024-05-03 ENCOUNTER — Ambulatory Visit (INDEPENDENT_AMBULATORY_CARE_PROVIDER_SITE_OTHER): Admitting: Family Medicine

## 2024-05-03 ENCOUNTER — Encounter: Payer: Self-pay | Admitting: Family Medicine

## 2024-05-03 VITALS — BP 130/70 | HR 102 | Temp 97.8°F | Resp 20 | Ht 63.0 in | Wt 177.6 lb

## 2024-05-03 DIAGNOSIS — D509 Iron deficiency anemia, unspecified: Secondary | ICD-10-CM | POA: Diagnosis not present

## 2024-05-03 DIAGNOSIS — E785 Hyperlipidemia, unspecified: Secondary | ICD-10-CM

## 2024-05-03 DIAGNOSIS — N184 Chronic kidney disease, stage 4 (severe): Secondary | ICD-10-CM

## 2024-05-03 DIAGNOSIS — F322 Major depressive disorder, single episode, severe without psychotic features: Secondary | ICD-10-CM

## 2024-05-03 DIAGNOSIS — T560X1S Toxic effect of lead and its compounds, accidental (unintentional), sequela: Secondary | ICD-10-CM

## 2024-05-03 DIAGNOSIS — M1A10X0 Lead-induced chronic gout, unspecified site, without tophus (tophi): Secondary | ICD-10-CM

## 2024-05-03 DIAGNOSIS — T560X1D Toxic effect of lead and its compounds, accidental (unintentional), subsequent encounter: Secondary | ICD-10-CM | POA: Diagnosis not present

## 2024-05-03 DIAGNOSIS — K50819 Crohn's disease of both small and large intestine with unspecified complications: Secondary | ICD-10-CM

## 2024-05-03 MED ORDER — DULOXETINE HCL 60 MG PO CPEP
ORAL_CAPSULE | ORAL | 3 refills | Status: DC
Start: 2024-05-03 — End: 2024-06-01

## 2024-05-03 NOTE — Assessment & Plan Note (Signed)
Per gi  

## 2024-05-03 NOTE — Assessment & Plan Note (Signed)
 Per nephrology

## 2024-05-03 NOTE — Progress Notes (Signed)
 Subjective:    Patient ID: [name], female    DOB: 1956-05-02, 68 y.o.   MRN: [id number]  Chief Complaint  Patient presents with   Medication Management    HPI Patient is in today for f/u depression..    Discussed the use of AI scribe software for clinical note transcription with the patient, who gave verbal consent to proceed.  History of Present Illness [name] is a 68 year old female who presents with increased left-sided pain and concerns about her Cymbalta  dosage.  She has experienced increased pain in her left side, described as severe and different from the usual dull ache she has had for over twenty years. This change coincided with a reduction in her Cymbalta  dosage from 120 mg to 60 mg. She has a history of surgeries in the left abdominal area and previously managed the pain with hydrocodone , which she used sparingly. Cymbalta  had been effective in managing her pain until the dosage was reduced.  She has been on Cymbalta  since 2007 and typically takes her medications at bedtime. The reduction in Cymbalta  dosage has coincided with increased irritability, as noted by her mother.  She has not been able to visit the clinic regularly due to caregiving responsibilities for her 48 year old mother, who requires constant supervision due to frequent falls. She has been staying with her mother four days a week, while her sister covers the remaining three days. This caregiving role has impacted her ability to attend medical appointments.  She had been living with her boyfriend for sixteen years until his recent passing and is currently involved in a legal dispute with his son over possessions acquired during their time together.    Past Medical History:  Diagnosis Date   Abdominal pain    Hx   Acute pyelonephritis 12/07/2020   Allergy    Anal stenosis    Anemia    Anxiety    Arthritis    Asthma    patient does not have inhaler   Blood in stool    Hx   Blood in  urine    Hx   Blood transfusion without reported diagnosis    Cataract    CKD (chronic kidney disease) stage 3, GFR 30-59 ml/min (HCC)    Crohn's colitis, other complication (HCC)    De Quervain's tenosynovitis    Depression    Difficulty urinating    Hx   Easy bruising    Esophagitis    Fainting    History - resolved - due to dehydration   Fatigue    Hx   Fibroid    Hx   Gastric polyp    GERD (gastroesophageal reflux disease)    Hearing loss    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   Hemorrhoids, external    Hemorrhoids, internal    Herpes, genital    vaginal treated 07/05/12 and pt states is resolved   History of cervical dysplasia    History of small bowel obstruction    Hyperlipidemia    currently no meds   Hyperparathyroidism    Hypertension    Hypokalemia    Hx   Hypotension 06/29/2022   IBD (inflammatory bowel disease)    initially colectomy for suspected UC, 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   Obesity    Ovarian cyst    Pain in joint, pelvic region and thigh 12/16/2008   Centricity  Description: HIP PAIN, LEFT  Qualifier: Diagnosis of   By: Antonio ROSALEA Rockers      Centricity Description: HIP PAIN  Qualifier: Diagnosis of   By: Antonio ROSALEA Rockers       Pain of right thumb 04/12/2018   Poor dental hygiene    Pulmonary nodule, right    right upper lobe   Rectal bleeding    Hx   Rectal pain    Hx   Renal insufficiency    CKD - stage 3   RLS (restless legs syndrome)    no meds   Sepsis secondary to UTI (HCC) 06/29/2022   Severe sepsis (HCC) 12/07/2020   Tooth infection 11/2016   right low   Ulcerative colitis    Visual disturbance    wears glasses   Weakness generalized    Hx - patient denies generalized weakness   Wears dentures    upper only    Past Surgical History:  Procedure Laterality Date   ANAL DILATION     BIOPSY  07/20/2022   Procedure: BIOPSY;  Surgeon: Aneita Gwendlyn DASEN, MD;   Location: [facility];  Service: Gastroenterology;;   CERVICAL BIOPSY  W/ LOOP ELECTRODE EXCISION     CHOLECYSTECTOMY     COLONOSCOPY     [name]   fatty tumor removed from back     X 2   HEMORRHOID SURGERY     ILEOSCOPY N/A 07/20/2022   Procedure: ILEOSCOPY THROUGH STOMA;  Surgeon: Aneita Gwendlyn DASEN, MD;  Location: [facility];  Service: Gastroenterology;  Laterality: N/A;   ILEOSTOMY CLOSURE     IR CV LINE INJECTION  05/30/2023   IR CV LINE INJECTION  08/02/2023   IR CV LINE INJECTION  12/05/2023   IR IMAGING GUIDED PORT INSERTION  04/12/2023   IR IMAGING GUIDED PORT INSERTION  12/22/2023   IR REMOVAL TUN ACCESS W/ PORT W/O FL MOD SED  12/22/2023   RESTORATIVE PROCTOCOLECTOMY     with insertion of ileoanal J Pouch with loop ileostomy   SHOULDER ARTHROSCOPY WITH CAPSULORRHAPHY Left 06/14/2019   Procedure: LEFT SHOULDER ARTHRSCOPIC REPAIR OF BONY BANKART FRACTURE;  Surgeon: Dozier Soulier, MD;  Location: [facility];  Service: Orthopedics;  Laterality: Left;  REQUEST 90 MINUTE   SIGMOIDOSCOPY     TOTAL ABDOMINAL HYSTERECTOMY  1998   TAH/LSO   TUBAL LIGATION     UPPER GASTROINTESTINAL ENDOSCOPY     [name]    Family History  Problem Relation Age of Onset   Hypertension Mother    Heart disease Mother        s/p pci   Ulcerative colitis Father    Hypertension Father    Heart attack Father    Diabetes Sister    Cancer Sister        uterine   Pulmonary fibrosis Sister    Cancer Maternal Uncle        LUNG   Ulcerative colitis Daughter    Liver disease Daughter    Irritable bowel syndrome Other        grandchildren   Colon cancer Neg Hx    Esophageal cancer Neg Hx    Stomach cancer Neg Hx    Rectal cancer Neg Hx     Social History   Socioeconomic History   Marital status: Divorced    Spouse name: Not on file   Number of children: Not on file   Years of education: Not on file   Highest education level: Not on file  Occupational  History   Not on file  Tobacco Use   Smoking  status: Former    Current packs/day: 0.00    Average packs/day: 1 pack/day for 4.0 years (4.0 ttl pk-yrs)    Types: Cigarettes    Start date: 05/21/1975    Quit date: 05/21/1979    Years since quitting: 44.9   Smokeless tobacco: Never  Vaping Use   Vaping status: Never Used  Substance and Sexual Activity   Alcohol use: No    Alcohol/week: 0.0 standard drinks of alcohol   Drug use: No   Sexual activity: Yes    Birth control/protection: Post-menopausal    Comment: 1st intercourse 68 yo-Fewer than 5 partners  Other Topics Concern   Not on file  Social History Narrative   Not on file   Social Drivers of Health   Financial Resource Strain: Low Risk  (05/26/2022)   Overall Financial Resource Strain (CARDIA)    Difficulty of Paying Living Expenses: Not hard at all  Food Insecurity: No Food Insecurity (05/28/2023)   Hunger Vital Sign    Worried About Running Out of Food in the Last Year: Never true    Ran Out of Food in the Last Year: Never true  Transportation Needs: No Transportation Needs (05/28/2023)   PRAPARE - Administrator, Civil Service (Medical): No    Lack of Transportation (Non-Medical): No  Physical Activity: Inactive (05/26/2022)   Exercise Vital Sign    Days of Exercise per Week: 0 days    Minutes of Exercise per Session: 0 min  Stress: No Stress Concern Present (05/26/2022)   [employer] of Occupational Health - Occupational Stress Questionnaire    Feeling of Stress : Not at all  Social Connections: Moderately Isolated (05/26/2022)   Social Connection and Isolation Panel    Frequency of Communication with Friends and Family: More than three times a week    Frequency of Social Gatherings with Friends and Family: More than three times a week    Attends Religious Services: Never    Database administrator or Organizations: No    Attends Banker Meetings: Never    Marital Status: Married  Catering manager Violence: Not At Risk (05/28/2023)    Humiliation, Afraid, Rape, and Kick questionnaire    Fear of Current or Ex-Partner: No    Emotionally Abused: No    Physically Abused: No    Sexually Abused: No    Outpatient Medications Prior to Visit  Medication Sig Dispense Refill   acetaminophen  (TYLENOL ) 500 MG tablet Take 1,000 mg by mouth every 6 (six) hours as needed (for pain/sleep).     allopurinol  (ZYLOPRIM ) 300 MG tablet TAKE 1/2 TABLET (150 mg total) BY MOUTH DAILY AT 9PM 90 tablet 11   amLODipine  (NORVASC ) 5 MG tablet Take 5 mg by mouth daily.     apixaban  (ELIQUIS ) 5 MG TABS tablet Take 1 tablet (5 mg total) by mouth 2 (two) times daily. 180 tablet 0   Calcium  Carbonate Antacid (TUMS EXTRA STRENGTH 750 PO) Take 1-2 tablets by mouth every 6 (six) hours as needed (CHEW for heartburn).     dicyclomine  (BENTYL ) 10 MG capsule Take 1 capsule (10 mg total) by mouth every 8 (eight) hours as needed for spasms. 60 capsule 3   folic acid  (FOLVITE ) 1 MG tablet Take 2 tablets (2 mg total) by mouth daily. (Patient taking differently: Take 1 mg by mouth daily.) 180 tablet 3   GAS-X EXTRA STRENGTH 125 MG  CAPS Take 125 mg by mouth at bedtime.     metoprolol  succinate (TOPROL -XL) 100 MG 24 hr tablet Take 100 mg by mouth at bedtime.     octreotide  (SANDOSTATIN  LAR DEPOT) 20 MG injection Inject 20 mg into the muscle every 28 (twenty-eight) days. 1 each 6   OVER THE COUNTER MEDICATION Take 1 tablet by mouth See admin instructions. Nervive Nerve Health tablet- Take 1 tablet by mouth at bedtime     Psyllium (METAMUCIL PREMIUM BLEND) 52.63 % POWD Take Daily     Vitamin D , Ergocalciferol , (DRISDOL ) 1.25 MG (50000 UNIT) CAPS capsule Take 50,000 Units by mouth every 30 (thirty) days.     cephALEXin  (KEFLEX ) 500 MG capsule Take 1 capsule (500 mg total) by mouth 2 (two) times daily. 20 capsule 0   DULoxetine  (CYMBALTA ) 60 MG capsule TAKE ONE CAPSULE (60MG  TOTAL) BY MOUTH DAILY AT 9AM PATIENT NEEDS FURTHER EVALUATION AND/OR LABORATORY TESTING BEFORE FURTHER  REFILLS ARE GIVEN 60 capsule 0   Facility-Administered Medications Prior to Visit  Medication Dose Route Frequency Provider Last Rate Last Admin   heparin  lock flush 100 unit/mL  500 Units Intracatheter Once PRN Rayburn Pac, MD       heparin  lock flush 100 unit/mL  250 Units Intracatheter Once PRN Rayburn Pac, MD       sodium chloride  0.9 % bolus 1,000 mL  1,000 mL Intravenous Once Rayburn Pac, MD        Allergies  Allergen Reactions   Sulfa Antibiotics Hives, Itching, Rash and Swelling   Morphine Other (See Comments)    Gives me crazy dreams (delusions, also) Psychosis, also    Sulfonamide Derivatives Hives, Itching and Swelling    Review of Systems  Constitutional:  Negative for fever and malaise/fatigue.  HENT:  Negative for congestion.   Eyes:  Negative for blurred vision.  Respiratory:  Negative for cough and shortness of breath.   Cardiovascular:  Negative for chest pain, palpitations and leg swelling.  Gastrointestinal:  Negative for vomiting.  Musculoskeletal:  Negative for back pain.  Skin:  Negative for rash.  Neurological:  Negative for loss of consciousness and headaches.  Psychiatric/Behavioral:  Positive for depression. The patient is nervous/anxious.        Objective:    Physical Exam Vitals and nursing note reviewed.  Constitutional:      General: She is not in acute distress.    Appearance: Normal appearance. She is well-developed.  HENT:     Head: Normocephalic and atraumatic.  Eyes:     General: No scleral icterus.       Right eye: No discharge.        Left eye: No discharge.  Cardiovascular:     Rate and Rhythm: Normal rate and regular rhythm.     Heart sounds: No murmur heard. Pulmonary:     Effort: Pulmonary effort is normal. No respiratory distress.     Breath sounds: Normal breath sounds.  Musculoskeletal:        General: Normal range of motion.     Cervical back: Normal range of motion and neck supple.     Right  lower leg: No edema.     Left lower leg: No edema.  Skin:    General: Skin is warm and dry.  Neurological:     Mental Status: She is alert and oriented to person, place, and time.  Psychiatric:        Mood and Affect: Mood is anxious and depressed.  Behavior: Behavior normal.        Thought Content: Thought content normal.        Judgment: Judgment normal.     BP 130/70 (BP Location: Left Arm, Patient Position: Sitting, Cuff Size: Large)   Pulse (!) 102   Temp 97.8 F (36.6 C) (Oral)   Resp 20   Ht 5' 3 (1.6 m)   Wt 177 lb 9.6 oz (80.6 kg)   SpO2 97%   BMI 31.46 kg/m  Wt Readings from Last 3 Encounters:  05/03/24 177 lb 9.6 oz (80.6 kg)  05/02/24 177 lb 12.8 oz (80.6 kg)  04/27/24 178 lb 9.6 oz (81 kg)    Diabetic Foot Exam - Simple   No data filed    Lab Results  Component Value Date   WBC 11.9 (H) 09/13/2023   HGB 10.4 (L) 09/13/2023   HCT 32.4 (L) 09/13/2023   PLT 351.0 09/13/2023   GLUCOSE 126 (H) 09/13/2023   CHOL 231 (H) 09/13/2023   TRIG 260.0 (H) 09/13/2023   HDL 52.30 09/13/2023   LDLDIRECT 109.0 08/03/2022   LDLCALC 127 (H) 09/13/2023   ALT 14 09/13/2023   AST 20 09/13/2023   NA 137 09/13/2023   K 3.8 09/13/2023   CL 108 09/13/2023   CREATININE 3.56 (H) 09/13/2023   BUN 42 (H) 09/13/2023   CO2 15 (L) 09/13/2023   TSH 2.35 09/13/2023   INR 1.0 05/26/2021   HGBA1C 5.6 08/23/2020    Lab Results  Component Value Date   TSH 2.35 09/13/2023   Lab Results  Component Value Date   WBC 11.9 (H) 09/13/2023   HGB 10.4 (L) 09/13/2023   HCT 32.4 (L) 09/13/2023   MCV 104.1 (H) 09/13/2023   PLT 351.0 09/13/2023   Lab Results  Component Value Date   NA 137 09/13/2023   K 3.8 09/13/2023   CO2 15 (L) 09/13/2023   GLUCOSE 126 (H) 09/13/2023   BUN 42 (H) 09/13/2023   CREATININE 3.56 (H) 09/13/2023   BILITOT 0.4 09/13/2023   ALKPHOS 138 (H) 09/13/2023   AST 20 09/13/2023   ALT 14 09/13/2023   PROT 7.2 09/13/2023   ALBUMIN 4.0 09/13/2023    CALCIUM  7.4 (L) 09/13/2023   ANIONGAP 10 06/01/2023   EGFR 11.0 03/07/2024   GFR 12.50 (LL) 09/13/2023   Lab Results  Component Value Date   CHOL 231 (H) 09/13/2023   Lab Results  Component Value Date   HDL 52.30 09/13/2023   Lab Results  Component Value Date   LDLCALC 127 (H) 09/13/2023   Lab Results  Component Value Date   TRIG 260.0 (H) 09/13/2023   Lab Results  Component Value Date   CHOLHDL 4 09/13/2023   Lab Results  Component Value Date   HGBA1C 5.6 08/23/2020       Assessment & Plan:  Stage 4 chronic kidney disease (HCC) Assessment & Plan: Per nephrology  Orders: -     Comprehensive metabolic panel with GFR  Depression, major, single episode, severe (HCC) -     DULoxetine  HCl; 2 po every day  Dispense: 360 capsule; Refill: 3  CKD (chronic kidney disease) stage 4, GFR 15-29 ml/min (HCC) -     Vitamin B12 -     VITAMIN D  25 Hydroxy (Vit-D Deficiency, Fractures) -     TSH  Hyperlipidemia, unspecified hyperlipidemia type -     CBC with Differential/Platelet -     Comprehensive metabolic panel with GFR -  Lipid panel -     TSH  Iron  deficiency anemia, unspecified iron  deficiency anemia type -     CBC with Differential/Platelet  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 Increase Cymbalta  back to 120 mg daily.  Chronic left abdominal pain   She has experienced chronic left abdominal pain for over 20 years, usually a dull ache, but it has recently worsened. The increase in pain coincides with the reduction in Cymbalta  dosage. She has a history of surgeries in the area and previously used hydrocodone  sparingly for pain management. Increase  Cymbalta  back to 120 mg daily to assess its impact on abdominal pain.   Khanh Tanori R Lowne Chase, DO

## 2024-05-04 ENCOUNTER — Telehealth: Payer: Self-pay | Admitting: *Deleted

## 2024-05-04 ENCOUNTER — Ambulatory Visit

## 2024-05-04 VITALS — BP 170/103 | HR 83 | Temp 97.9°F | Resp 20 | Ht 63.0 in

## 2024-05-04 DIAGNOSIS — E860 Dehydration: Secondary | ICD-10-CM

## 2024-05-04 LAB — LIPID PANEL
Cholesterol: 199 mg/dL (ref 0–200)
HDL: 41.7 mg/dL (ref >39.00)
LDL Cholesterol: 118 mg/dL — ABNORMAL HIGH (ref 0–99)
NonHDL: 157.52
Total CHOL/HDL Ratio: 5
Triglycerides: 198 mg/dL — ABNORMAL HIGH (ref 0.0–149.0)
VLDL: 39.6 mg/dL (ref 0.0–40.0)

## 2024-05-04 LAB — CBC WITH DIFFERENTIAL/PLATELET
Basophils Absolute: 0.1 K/uL (ref 0.0–0.1)
Basophils Relative: 0.9 % (ref 0.0–3.0)
Eosinophils Absolute: 0.4 K/uL (ref 0.0–0.7)
Eosinophils Relative: 3.6 % (ref 0.0–5.0)
HCT: 33.5 % — ABNORMAL LOW (ref 36.0–46.0)
Hemoglobin: 10.7 g/dL — ABNORMAL LOW (ref 12.0–15.0)
Lymphocytes Relative: 16.2 % (ref 12.0–46.0)
Lymphs Abs: 2 K/uL (ref 0.7–4.0)
MCHC: 31.8 g/dL (ref 30.0–36.0)
MCV: 101.4 fl — ABNORMAL HIGH (ref 78.0–100.0)
Monocytes Absolute: 0.7 K/uL (ref 0.1–1.0)
Monocytes Relative: 5.8 % (ref 3.0–12.0)
Neutro Abs: 9.1 K/uL — ABNORMAL HIGH (ref 1.4–7.7)
Neutrophils Relative %: 73.5 % (ref 43.0–77.0)
Platelets: 398 K/uL (ref 150.0–400.0)
RBC: 3.31 Mil/uL — ABNORMAL LOW (ref 3.87–5.11)
RDW: 15.9 % — ABNORMAL HIGH (ref 11.5–15.5)
WBC: 12.4 K/uL — ABNORMAL HIGH (ref 4.0–10.5)

## 2024-05-04 LAB — COMPREHENSIVE METABOLIC PANEL WITH GFR
ALT: 37 U/L — ABNORMAL HIGH (ref 0–35)
AST: 28 U/L (ref 0–37)
Albumin: 4 g/dL (ref 3.5–5.2)
Alkaline Phosphatase: 195 U/L — ABNORMAL HIGH (ref 39–117)
BUN: 51 mg/dL — ABNORMAL HIGH (ref 6–23)
CO2: 13 meq/L — ABNORMAL LOW (ref 19–32)
Calcium: 7.5 mg/dL — ABNORMAL LOW (ref 8.4–10.5)
Chloride: 112 meq/L (ref 96–112)
Creatinine, Ser: 4.97 mg/dL (ref 0.40–1.20)
GFR: 8.34 mL/min — CL (ref >60.00)
Glucose, Bld: 112 mg/dL — ABNORMAL HIGH (ref 70–99)
Potassium: 3.6 meq/L (ref 3.5–5.1)
Sodium: 140 meq/L (ref 135–145)
Total Bilirubin: 0.4 mg/dL (ref 0.2–1.2)
Total Protein: 7.9 g/dL (ref 6.0–8.3)

## 2024-05-04 LAB — URIC ACID: Uric Acid, Serum: 4.7 mg/dL (ref 2.4–7.0)

## 2024-05-04 LAB — TSH: TSH: 2.42 u[IU]/mL (ref 0.35–5.50)

## 2024-05-04 LAB — VITAMIN D 25 HYDROXY (VIT D DEFICIENCY, FRACTURES): VITD: 16.9 ng/mL — ABNORMAL LOW (ref 30.00–100.00)

## 2024-05-04 LAB — VITAMIN B12: Vitamin B-12: 612 pg/mL (ref 211–911)

## 2024-05-04 MED ORDER — SODIUM CHLORIDE 0.9 % IV BOLUS
1000.0000 mL | Freq: Once | INTRAVENOUS | Status: AC
  Administered 2024-05-04: 1000 mL via INTRAVENOUS
  Filled 2024-05-04: qty 1000

## 2024-05-04 MED ORDER — HEPARIN SOD (PORK) LOCK FLUSH 100 UNIT/ML IV SOLN
500.0000 [IU] | Freq: Once | INTRAVENOUS | Status: AC | PRN
  Administered 2024-05-04: 500 [IU]
  Filled 2024-05-04: qty 5

## 2024-05-04 NOTE — Telephone Encounter (Signed)
 CRITICAL VALUE STICKER  CRITICAL VALUE: creatinine 4.97 and GFR 8.34  MESSENGER (representative from lab): Sy

## 2024-05-04 NOTE — Telephone Encounter (Signed)
 Labs faxed

## 2024-05-04 NOTE — Progress Notes (Signed)
 Diagnosis: Dehydration  Provider:  Mannam, Praveen MD  Procedure: IV Infusion  IV Type: Port a Cath, IV Location: R Chest  Normal Saline, Dose: 1000 ml  Infusion Start Time: 0833  Infusion Stop Time: 0937  Post Infusion IV Care: Port a Cath Deaccessed/Flushed and heparin  locked  Discharge: Condition: Good, Destination: Home . AVS Declined  Performed by:  Rocky FORBES Sar, RN

## 2024-05-06 ENCOUNTER — Ambulatory Visit: Payer: Self-pay | Admitting: Family Medicine

## 2024-05-06 ENCOUNTER — Other Ambulatory Visit: Payer: Self-pay | Admitting: Family Medicine

## 2024-05-06 DIAGNOSIS — N184 Chronic kidney disease, stage 4 (severe): Secondary | ICD-10-CM

## 2024-05-06 DIAGNOSIS — E785 Hyperlipidemia, unspecified: Secondary | ICD-10-CM

## 2024-05-08 ENCOUNTER — Ambulatory Visit (INDEPENDENT_AMBULATORY_CARE_PROVIDER_SITE_OTHER)

## 2024-05-08 VITALS — BP 139/89 | HR 86 | Temp 97.5°F | Resp 18 | Ht 63.0 in | Wt 177.8 lb

## 2024-05-08 DIAGNOSIS — E860 Dehydration: Secondary | ICD-10-CM | POA: Diagnosis not present

## 2024-05-08 MED ORDER — HEPARIN SOD (PORK) LOCK FLUSH 100 UNIT/ML IV SOLN: 250.0000 [IU] | Freq: Once | INTRAVENOUS | Status: DC | PRN

## 2024-05-08 MED ORDER — HEPARIN SOD (PORK) LOCK FLUSH 100 UNIT/ML IV SOLN
500.0000 [IU] | Freq: Once | INTRAVENOUS | Status: AC | PRN
  Administered 2024-05-08: 500 [IU]
  Filled 2024-05-08: qty 5

## 2024-05-08 MED ORDER — SODIUM CHLORIDE 0.9 % IV BOLUS
1000.0000 mL | Freq: Once | INTRAVENOUS | Status: AC
  Administered 2024-05-08: 1000 mL via INTRAVENOUS
  Filled 2024-05-08: qty 1000

## 2024-05-08 NOTE — Progress Notes (Signed)
 Diagnosis: Dehydration  Provider:  Praveen Mannam MD  Procedure: IV Infusion  IV Type: Port a Cath, IV Location: R Chest  Normal Saline, Dose: 1000 ml  Infusion Start Time: 0829  Infusion Stop Time: 0933  Post Infusion IV Care: Port a Cath Deaccessed/Flushed heparin  flushed .  Discharge: Condition: Good, Destination: Home . AVS Declined  Performed by:  Sanita Estrada, RN

## 2024-05-09 ENCOUNTER — Ambulatory Visit

## 2024-05-09 VITALS — BP 143/85 | HR 79 | Temp 97.5°F | Resp 18 | Ht 63.0 in | Wt 179.6 lb

## 2024-05-09 DIAGNOSIS — E860 Dehydration: Secondary | ICD-10-CM

## 2024-05-09 MED ORDER — SODIUM CHLORIDE 0.9 % IV BOLUS
1000.0000 mL | Freq: Once | INTRAVENOUS | Status: AC
  Administered 2024-05-09: 1000 mL via INTRAVENOUS
  Filled 2024-05-09: qty 1000

## 2024-05-09 MED ORDER — HEPARIN SOD (PORK) LOCK FLUSH 100 UNIT/ML IV SOLN: 250.0000 [IU] | Freq: Once | INTRAVENOUS | Status: DC | PRN

## 2024-05-09 MED ORDER — HEPARIN SOD (PORK) LOCK FLUSH 100 UNIT/ML IV SOLN
500.0000 [IU] | Freq: Once | INTRAVENOUS | Status: AC | PRN
  Administered 2024-05-09: 500 [IU]
  Filled 2024-05-09: qty 5

## 2024-05-09 NOTE — Progress Notes (Signed)
 Diagnosis: Dehydration  Provider:  Praveen Mannam MD  Procedure: IV Infusion  IV Type: Port a Cath, IV Location: R Chest  Normal Saline, Dose: 1000 ml  Infusion Start Time: 0829  Infusion Stop Time: 0934  Post Infusion IV Care: Port a Cath Deaccessed/Flushed Heparin  locked.  Discharge: Condition: Good, Destination: Home . AVS Declined  Performed by:  Crystal Ellwood, RN

## 2024-05-11 ENCOUNTER — Ambulatory Visit

## 2024-05-11 VITALS — BP 148/87 | HR 82 | Temp 98.1°F | Resp 12 | Ht 63.0 in | Wt 178.6 lb

## 2024-05-11 DIAGNOSIS — E860 Dehydration: Secondary | ICD-10-CM

## 2024-05-11 MED ORDER — HEPARIN SOD (PORK) LOCK FLUSH 100 UNIT/ML IV SOLN
500.0000 [IU] | Freq: Once | INTRAVENOUS | Status: AC | PRN
Start: 2024-05-11 — End: 2024-05-11
  Administered 2024-05-11: 500 [IU]
  Filled 2024-05-11: qty 5

## 2024-05-11 MED ORDER — SODIUM CHLORIDE 0.9 % IV BOLUS
1000.0000 mL | Freq: Once | INTRAVENOUS | Status: AC
  Administered 2024-05-11: 1000 mL via INTRAVENOUS
  Filled 2024-05-11: qty 1000

## 2024-05-11 NOTE — Progress Notes (Signed)
 Diagnosis: Dehydration  Provider:  Mannam, Praveen MD  Procedure: IV Infusion  IV Type: Port a Cath, IV Location: R Chest  Normal Saline, Dose: 1000 ml  Infusion Start Time: 0821  Infusion Stop Time: 0928  Post Infusion IV Care: Port a Cath Deaccessed/Flushed and heparin  locked  Discharge: Condition: Good, Destination: Home . AVS Declined  Performed by:  Rocky FORBES Sar, RN

## 2024-05-14 ENCOUNTER — Ambulatory Visit (INDEPENDENT_AMBULATORY_CARE_PROVIDER_SITE_OTHER)

## 2024-05-14 VITALS — BP 170/90 | HR 86 | Temp 97.4°F | Resp 18 | Ht 63.0 in | Wt 177.6 lb

## 2024-05-14 DIAGNOSIS — E860 Dehydration: Secondary | ICD-10-CM

## 2024-05-14 MED ORDER — HEPARIN SOD (PORK) LOCK FLUSH 100 UNIT/ML IV SOLN: 250.0000 [IU] | Freq: Once | INTRAVENOUS | Status: DC | PRN

## 2024-05-14 MED ORDER — HEPARIN SOD (PORK) LOCK FLUSH 100 UNIT/ML IV SOLN
500.0000 [IU] | Freq: Once | INTRAVENOUS | Status: AC | PRN
  Administered 2024-05-14: 500 [IU]
  Filled 2024-05-14: qty 5

## 2024-05-14 MED ORDER — SODIUM CHLORIDE 0.9 % IV BOLUS
1000.0000 mL | Freq: Once | INTRAVENOUS | Status: AC
  Administered 2024-05-14: 1000 mL via INTRAVENOUS
  Filled 2024-05-14: qty 1000

## 2024-05-14 NOTE — Progress Notes (Signed)
 Diagnosis: Dehydration  Provider:  Praveen Mannam MD  Procedure: IV Infusion  IV Type: Port a Cath, IV Location: R Chest  Normal Saline, Dose: 1000 ml  Infusion Start Time: 0829  Infusion Stop Time: 0936  Post Infusion IV Care: Port a Cath Deaccessed/Flushed and heparin  locked.  Discharge: Condition: Good, Destination: Home . AVS Declined  Performed by:  Dan Dissinger, RN

## 2024-05-16 ENCOUNTER — Ambulatory Visit (INDEPENDENT_AMBULATORY_CARE_PROVIDER_SITE_OTHER)

## 2024-05-16 VITALS — BP 178/95 | HR 97 | Temp 97.6°F | Resp 18 | Ht 63.0 in | Wt 180.0 lb

## 2024-05-16 DIAGNOSIS — E860 Dehydration: Secondary | ICD-10-CM

## 2024-05-16 MED ORDER — HEPARIN SOD (PORK) LOCK FLUSH 100 UNIT/ML IV SOLN
500.0000 [IU] | Freq: Once | INTRAVENOUS | Status: AC | PRN
  Administered 2024-05-16: 500 [IU]
  Filled 2024-05-16: qty 5

## 2024-05-16 MED ORDER — SODIUM CHLORIDE 0.9 % IV BOLUS
1000.0000 mL | Freq: Once | INTRAVENOUS | Status: AC
  Administered 2024-05-16: 1000 mL via INTRAVENOUS
  Filled 2024-05-16: qty 1000

## 2024-05-16 MED ORDER — HEPARIN SOD (PORK) LOCK FLUSH 100 UNIT/ML IV SOLN: 250.0000 [IU] | Freq: Once | INTRAVENOUS | Status: DC | PRN

## 2024-05-16 NOTE — Progress Notes (Signed)
 Diagnosis: Dehydration  Provider:  Praveen Mannam MD  Procedure: IV Infusion  IV Type: Port a Cath, IV Location: R Chest  Normal Saline, Dose: 1000 ml  Infusion Start Time: 0828  Infusion Stop Time: 0932  Post Infusion IV Care: Port a Cath Deaccessed/Flushed And heparin  locked.  Discharge: Condition: Good, Destination: Home . AVS Declined  Performed by:  Courtlyn Aki, RN

## 2024-05-18 ENCOUNTER — Ambulatory Visit (INDEPENDENT_AMBULATORY_CARE_PROVIDER_SITE_OTHER)

## 2024-05-18 VITALS — BP 145/85 | HR 88 | Temp 97.6°F | Resp 18 | Ht 63.0 in | Wt 179.8 lb

## 2024-05-18 DIAGNOSIS — D631 Anemia in chronic kidney disease: Secondary | ICD-10-CM | POA: Diagnosis not present

## 2024-05-18 DIAGNOSIS — N2581 Secondary hyperparathyroidism of renal origin: Secondary | ICD-10-CM | POA: Diagnosis not present

## 2024-05-18 DIAGNOSIS — E876 Hypokalemia: Secondary | ICD-10-CM | POA: Diagnosis not present

## 2024-05-18 DIAGNOSIS — M109 Gout, unspecified: Secondary | ICD-10-CM | POA: Diagnosis not present

## 2024-05-18 DIAGNOSIS — K91850 Pouchitis: Secondary | ICD-10-CM | POA: Diagnosis not present

## 2024-05-18 DIAGNOSIS — R768 Other specified abnormal immunological findings in serum: Secondary | ICD-10-CM | POA: Diagnosis not present

## 2024-05-18 DIAGNOSIS — D509 Iron deficiency anemia, unspecified: Secondary | ICD-10-CM | POA: Diagnosis not present

## 2024-05-18 DIAGNOSIS — I129 Hypertensive chronic kidney disease with stage 1 through stage 4 chronic kidney disease, or unspecified chronic kidney disease: Secondary | ICD-10-CM | POA: Diagnosis not present

## 2024-05-18 DIAGNOSIS — N184 Chronic kidney disease, stage 4 (severe): Secondary | ICD-10-CM | POA: Diagnosis not present

## 2024-05-18 DIAGNOSIS — N179 Acute kidney failure, unspecified: Secondary | ICD-10-CM | POA: Diagnosis not present

## 2024-05-18 DIAGNOSIS — E88819 Insulin resistance, unspecified: Secondary | ICD-10-CM | POA: Diagnosis not present

## 2024-05-18 DIAGNOSIS — E860 Dehydration: Secondary | ICD-10-CM

## 2024-05-18 MED ORDER — SODIUM CHLORIDE 0.9 % IV BOLUS
1000.0000 mL | Freq: Once | INTRAVENOUS | Status: AC
  Administered 2024-05-18: 1000 mL via INTRAVENOUS
  Filled 2024-05-18: qty 1000

## 2024-05-18 MED ORDER — HEPARIN SOD (PORK) LOCK FLUSH 100 UNIT/ML IV SOLN
500.0000 [IU] | Freq: Once | INTRAVENOUS | Status: AC | PRN
  Administered 2024-05-18: 500 [IU]
  Filled 2024-05-18: qty 5

## 2024-05-18 NOTE — Progress Notes (Deleted)
 Diagnosis: {Diagnosis:25401}  Provider:  {RYPWQFI:72011::Emjczzw Mannam MD}  Procedure: {Injection/Infusion:25339}  {IV Type:25393::Peripheral}, {IV Location:25394}  {Medications:25395}, {Dose:25397}  {Infusion Start Time:25399}  {Infusion Stop Time:25400}  Post Infusion IV Care: {CHINF Post Infusion:25398}  Discharge: {Condition:19696:::1}, {Destination:18313::Home:1} . {CHINFAVS:28985}  Performed by:  Mathew Therisa NOVAK, RN

## 2024-05-18 NOTE — Progress Notes (Signed)
 Diagnosis: Dehydration  Provider:  Mannam, Praveen MD  Procedure: IV Infusion  IV Type: Port a Cath, IV Location: R Chest  Normal Saline, Dose: 1000 ml  Infusion Start Time: 0836  Infusion Stop Time: 0940  Post Infusion IV Care: Patient declined observation and Port a Cath Deaccessed/Flushed and heparin  locked  Discharge: Condition: Good, Destination: Home . AVS Declined  Performed by:  Rocky FORBES Sar, RN

## 2024-05-21 ENCOUNTER — Ambulatory Visit

## 2024-05-21 VITALS — BP 119/72 | HR 96 | Temp 97.6°F | Resp 20 | Ht 63.0 in | Wt 176.6 lb

## 2024-05-21 DIAGNOSIS — E860 Dehydration: Secondary | ICD-10-CM

## 2024-05-21 MED ORDER — HEPARIN SOD (PORK) LOCK FLUSH 100 UNIT/ML IV SOLN
500.0000 [IU] | Freq: Once | INTRAVENOUS | Status: AC | PRN
  Administered 2024-05-21: 500 [IU]
  Filled 2024-05-21: qty 5

## 2024-05-21 MED ORDER — HEPARIN SOD (PORK) LOCK FLUSH 100 UNIT/ML IV SOLN: 250.0000 [IU] | Freq: Once | INTRAVENOUS | Status: DC | PRN

## 2024-05-21 MED ORDER — SODIUM CHLORIDE 0.9 % IV BOLUS
1000.0000 mL | Freq: Once | INTRAVENOUS | Status: AC
  Administered 2024-05-21: 1000 mL via INTRAVENOUS
  Filled 2024-05-21: qty 1000

## 2024-05-21 NOTE — Progress Notes (Signed)
 Diagnosis: Dehydration  Provider:  Praveen Mannam MD  Procedure: IV Infusion  IV Type: Port a Cath, IV Location: R Chest  Normal Saline, Dose: 1000 ml  Infusion Start Time: 0845  Infusion Stop Time: 0946  Post Infusion IV Care: Port a Cath Deaccessed/Flushed and heparin  locked.  Discharge: Condition: Good, Destination: Home . AVS Declined  Performed by:  Joshuah Minella, RN

## 2024-05-22 ENCOUNTER — Ambulatory Visit (INDEPENDENT_AMBULATORY_CARE_PROVIDER_SITE_OTHER): Admitting: *Deleted

## 2024-05-22 VITALS — Ht 63.0 in | Wt 176.0 lb

## 2024-05-22 DIAGNOSIS — Z0000 Encounter for general adult medical examination without abnormal findings: Secondary | ICD-10-CM | POA: Diagnosis not present

## 2024-05-22 NOTE — Progress Notes (Signed)
 Subjective:   [name] is a 68 y.o. who presents for a Medicare Wellness preventive visit.  As a reminder, Annual Wellness Visits don't include a physical exam, and some assessments may be limited, especially if this visit is performed virtually. We may recommend an in-person follow-up visit with your provider if needed.  Visit Complete: Virtual I connected with  [name] on 05/22/24 by a audio enabled telemedicine application and verified that I am speaking with the correct person using two identifiers.  Patient Location: Home  Provider Location: Office/Clinic  I discussed the limitations of evaluation and management by telemedicine. The patient expressed understanding and agreed to proceed.  Vital Signs: Because this visit was a virtual/telehealth visit, some criteria may be missing or patient reported. Any vitals not documented were not able to be obtained and vitals that have been documented are patient reported.  VideoDeclined- This patient declined Librarian, academic. Therefore the visit was completed with audio only.  Persons Participating in Visit: Patient.  AWV Questionnaire: No: Patient Medicare AWV questionnaire was not completed prior to this visit.  Cardiac Risk Factors include: advanced age (>24men, >34 women);hypertension;dyslipidemia;Other (see comment), Risk factor comments: CKD     Objective:    Today's Vitals   05/22/24 1303  Weight: 176 lb (79.8 kg)  Height: 5' 3 (1.6 m)   Body mass index is 31.18 kg/m.     05/22/2024    1:20 PM 02/27/2024    8:14 AM 12/22/2023   10:16 AM 12/16/2023    8:13 AM 12/07/2023    8:12 AM 11/28/2023    8:16 AM 11/14/2023    8:17 AM  Advanced Directives  Does Patient Have a Medical Advance Directive? No No No No No No No  Would patient like information on creating a medical advance directive? Yes (MAU/Ambulatory/Procedural Areas - Information given) No - Patient declined No - Patient  declined No - Patient declined No - Patient declined No - Patient declined No - Patient declined    Current Medications (verified) Outpatient Encounter Medications as of 05/22/2024  Medication Sig   acetaminophen  (TYLENOL ) 500 MG tablet Take 1,000 mg by mouth every 6 (six) hours as needed (for pain/sleep).   allopurinol  (ZYLOPRIM ) 300 MG tablet TAKE 1/2 TABLET (150 mg total) BY MOUTH DAILY AT 9PM   amLODipine  (NORVASC ) 5 MG tablet Take 5 mg by mouth daily.   Calcium  Carbonate Antacid (TUMS EXTRA STRENGTH 750 PO) Take 1-2 tablets by mouth every 6 (six) hours as needed (CHEW for heartburn).   dicyclomine  (BENTYL ) 10 MG capsule Take 1 capsule (10 mg total) by mouth every 8 (eight) hours as needed for spasms.   DULoxetine  (CYMBALTA ) 60 MG capsule 2 po every day   folic acid  (FOLVITE ) 1 MG tablet Take 2 tablets (2 mg total) by mouth daily.   metoprolol  succinate (TOPROL -XL) 100 MG 24 hr tablet Take 100 mg by mouth at bedtime.   octreotide  (SANDOSTATIN  LAR DEPOT) 20 MG injection Inject 20 mg into the muscle every 28 (twenty-eight) days.   OVER THE COUNTER MEDICATION Take 1 tablet by mouth See admin instructions. Nervive Nerve Health tablet- Take 1 tablet by mouth at bedtime   Psyllium (METAMUCIL PREMIUM BLEND) 52.63 % POWD Take Daily   Vitamin D , Ergocalciferol , (DRISDOL ) 1.25 MG (50000 UNIT) CAPS capsule Take 50,000 Units by mouth every 30 (thirty) days.   apixaban  (ELIQUIS ) 5 MG TABS tablet Take 1 tablet (5 mg total) by mouth 2 (two)  times daily. (Patient not taking: Reported on 05/22/2024)   GAS-X EXTRA STRENGTH 125 MG CAPS Take 125 mg by mouth at bedtime. (Patient not taking: Reported on 05/22/2024)   Facility-Administered Encounter Medications as of 05/22/2024  Medication   heparin  lock flush 100 unit/mL   heparin  lock flush 100 unit/mL   sodium chloride  0.9 % bolus 1,000 mL    Allergies (verified) Sulfa antibiotics, Morphine, and Sulfonamide derivatives   History: Past Medical History:   Diagnosis Date   Abdominal pain    Hx   Acute pyelonephritis 12/07/2020   Allergy    Anal stenosis    Anemia    Anxiety    Arthritis    Asthma    patient does not have inhaler   Blood in stool    Hx   Blood in urine    Hx   Blood transfusion without reported diagnosis    Cataract    CKD (chronic kidney disease) stage 3, GFR 30-59 ml/min (HCC)    Crohn's colitis, other complication (HCC)    De Quervain's tenosynovitis    Depression    Difficulty urinating    Hx   Easy bruising    Esophagitis    Fainting    History - resolved - due to dehydration   Fatigue    Hx   Fibroid    Hx   Gastric polyp    GERD (gastroesophageal reflux disease)    Hearing loss    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   Hemorrhoids, external    Hemorrhoids, internal    Herpes, genital    vaginal treated 07/05/12 and pt states is resolved   History of cervical dysplasia    History of small bowel obstruction    Hyperlipidemia    currently no meds   Hyperparathyroidism    Hypertension    Hypokalemia    Hx   Hypotension 06/29/2022   IBD (inflammatory bowel disease)    initially colectomy for suspected UC, 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   Obesity    Ovarian cyst    Pain in joint, pelvic region and thigh 12/16/2008   Centricity Description: HIP PAIN, LEFT  Qualifier: Diagnosis of   By: Antonio ROSALEA Rockers      Centricity Description: HIP PAIN  Qualifier: Diagnosis of   By: Antonio ROSALEA Rockers       Pain of right thumb 04/12/2018   Poor dental hygiene    Pulmonary nodule, right    right upper lobe   Rectal bleeding    Hx   Rectal pain    Hx   Renal insufficiency    CKD - stage 3   RLS (restless legs syndrome)    no meds   Sepsis secondary to UTI (HCC) 06/29/2022   Severe sepsis (HCC) 12/07/2020   Tooth infection 11/2016   right low   Ulcerative colitis    Visual disturbance    wears glasses   Weakness generalized    Hx -  patient denies generalized weakness   Wears dentures    upper only   Past Surgical History:  Procedure Laterality Date   ANAL DILATION     BIOPSY  07/20/2022   Procedure: BIOPSY;  Surgeon: Aneita Gwendlyn DASEN, MD;  Location: [facility];  Service: Gastroenterology;;   CERVICAL BIOPSY  W/ LOOP ELECTRODE EXCISION     CHOLECYSTECTOMY     COLONOSCOPY     Obie  fatty tumor removed from back     X 2   HEMORRHOID SURGERY     ILEOSCOPY N/A 07/20/2022   Procedure: ILEOSCOPY THROUGH STOMA;  Surgeon: Aneita Gwendlyn DASEN, MD;  Location: [facility];  Service: Gastroenterology;  Laterality: N/A;   ILEOSTOMY CLOSURE     IR CV LINE INJECTION  05/30/2023   IR CV LINE INJECTION  08/02/2023   IR CV LINE INJECTION  12/05/2023   IR IMAGING GUIDED PORT INSERTION  04/12/2023   IR IMAGING GUIDED PORT INSERTION  12/22/2023   IR REMOVAL TUN ACCESS W/ PORT W/O FL MOD SED  12/22/2023   RESTORATIVE PROCTOCOLECTOMY     with insertion of ileoanal J Pouch with loop ileostomy   SHOULDER ARTHROSCOPY WITH CAPSULORRHAPHY Left 06/14/2019   Procedure: LEFT SHOULDER ARTHRSCOPIC REPAIR OF BONY BANKART FRACTURE;  Surgeon: Dozier Soulier, MD;  Location: [facility];  Service: Orthopedics;  Laterality: Left;  REQUEST 90 MINUTE   SIGMOIDOSCOPY     TOTAL ABDOMINAL HYSTERECTOMY  1998   TAH/LSO   TUBAL LIGATION     UPPER GASTROINTESTINAL ENDOSCOPY     [name]   Family History  Problem Relation Age of Onset   Hypertension Mother    Heart disease Mother        s/p pci   Ulcerative colitis Father    Hypertension Father    Heart attack Father    Diabetes Sister    Cancer Sister        uterine   Pulmonary fibrosis Sister    Cancer Maternal Uncle        LUNG   Ulcerative colitis Daughter    Liver disease Daughter    Irritable bowel syndrome Other        grandchildren   Colon cancer Neg Hx    Esophageal cancer Neg Hx    Stomach cancer Neg Hx    Rectal cancer Neg Hx    Social History   Socioeconomic History   Marital  status: Divorced    Spouse name: Not on file   Number of children: Not on file   Years of education: Not on file   Highest education level: Not on file  Occupational History   Not on file  Tobacco Use   Smoking status: Former    Current packs/day: 0.00    Average packs/day: 1 pack/day for 4.0 years (4.0 ttl pk-yrs)    Types: Cigarettes    Start date: 05/21/1975    Quit date: 05/21/1979    Years since quitting: 45.0   Smokeless tobacco: Never  Vaping Use   Vaping status: Never Used  Substance and Sexual Activity   Alcohol use: No    Alcohol/week: 0.0 standard drinks of alcohol   Drug use: No   Sexual activity: Yes    Birth control/protection: Post-menopausal    Comment: 1st intercourse 68 yo-Fewer than 5 partners  Other Topics Concern   Not on file  Social History Narrative   Not on file   Social Drivers of Health   Financial Resource Strain: Low Risk  (05/22/2024)   Overall Financial Resource Strain (CARDIA)    Difficulty of Paying Living Expenses: Not very hard  Food Insecurity: No Food Insecurity (05/22/2024)   Hunger Vital Sign    Worried About Running Out of Food in the Last Year: Never true    Ran Out of Food in the Last Year: Never true  Transportation Needs: No Transportation Needs (05/22/2024)   PRAPARE - Transportation    Lack  of Transportation (Medical): No    Lack of Transportation (Non-Medical): No  Physical Activity: Insufficiently Active (05/22/2024)   Exercise Vital Sign    Days of Exercise per Week: 7 days    Minutes of Exercise per Session: 10 min  Stress: No Stress Concern Present (05/22/2024)   [employer] of Occupational Health - Occupational Stress Questionnaire    Feeling of Stress: Not at all  Social Connections: Socially Isolated (05/22/2024)   Social Connection and Isolation Panel    Frequency of Communication with Friends and Family: More than three times a week    Frequency of Social Gatherings with Friends and Family: Once a week     Attends Religious Services: Never    Database administrator or Organizations: No    Attends Banker Meetings: Never    Marital Status: Widowed    Tobacco Counseling Counseling given: Not Answered    Clinical Intake:  Pre-visit preparation completed: Yes        BMI - recorded: 31.18 Nutritional Status: BMI > 30  Obese Nutritional Risks: None Diabetes: No  Lab Results  Component Value Date   HGBA1C 5.6 08/23/2020   HGBA1C 6.0 02/26/2020     How often do you need to have someone help you when you read instructions, pamphlets, or other written materials from your doctor or pharmacy?: 1 - Never  Interpreter Needed?: No  Information entered by :: Lolita Libra, CMA(AAMA)   Activities of Daily Living     05/22/2024    1:11 PM 05/28/2023    9:00 PM  In your present state of health, do you have any difficulty performing the following activities:  Hearing? 0 0  Vision? 0 0  Difficulty concentrating or making decisions? 0 0  Walking or climbing stairs? 0 0  Dressing or bathing? 0 0  Doing errands, shopping? 0 0  Preparing Food and eating ? N   Using the Toilet? N   In the past six months, have you accidently leaked urine? Y   Comment wears depends, has discussed with PCP   Do you have problems with loss of bowel control? N   Comment has ileostomy   Managing your Medications? N   Managing your Finances? N   Housekeeping or managing your Housekeeping? N     Patient Care Team: [name], [name], DO as PCP - General (Family Medicine) Rayburn Pac, MD (Nephrology) Armbruster, Elspeth SQUIBB, MD as Consulting Physician (Gastroenterology) Winfred Curlee DEL, MD (Inactive) as Consulting Physician (Gynecology) Roz Anes, MD as Consulting Physician (Ophthalmology) Nahser, Aleene PARAS, MD (Inactive) as Consulting Physician (Cardiology) Dozier Hazy, MD as Referring Physician Timmy Maude SAUNDERS, MD as Consulting Physician (Oncology)  I have updated  your Care Teams any recent Medical Services you may have received from other providers in the past year.     Assessment:   This is a routine wellness examination for [facility].  Hearing/Vision screen Hearing Screening - Comments:: Denies hearing difficulties.  Vision Screening - Comments:: Past due. Needs new provider. Will provide options to choose from   Goals Addressed   None    Depression Screen     05/03/2024    2:44 PM 02/27/2024    8:14 AM 12/16/2023    8:13 AM 12/07/2023    8:12 AM 11/28/2023    8:15 AM 11/14/2023    8:17 AM 11/11/2023    9:01 AM  PHQ 2/9 Scores  PHQ - 2 Score 6 0 0 0 0 0 0  PHQ- 9 Score 17         Pt states no changes since 05/03/24.  Fall Risk     05/22/2024    1:09 PM 02/27/2024    8:14 AM 12/26/2023    8:44 AM 12/16/2023    8:13 AM 12/07/2023    8:12 AM  Fall Risk   Falls in the past year? 1 1 1 1 1   Comment tripped on porch coming up steps      Number falls in past yr: 0 0 0 1 1  Injury with Fall? 0 1 1 1    Risk for fall due to : Impaired balance/gait History of fall(s);Impaired balance/gait Impaired balance/gait History of fall(s);Impaired balance/gait History of fall(s);Impaired balance/gait  Follow up Education provided Falls evaluation completed  Falls evaluation completed Falls evaluation completed    MEDICARE RISK AT HOME:  Medicare Risk at Home Any stairs in or around the home?: Yes If so, are there any without handrails?: No Home free of loose throw rugs in walkways, pet beds, electrical cords, etc?: No Adequate lighting in your home to reduce risk of falls?: Yes Life alert?: No Use of a cane, walker or w/c?: No Grab bars in the bathroom?: No Shower chair or bench in shower?: Yes Elevated toilet seat or a handicapped toilet?: No  TIMED UP AND GO:  Was the test performed?  No,audio  Cognitive Function: 6CIT completed    06/05/2018    9:14 AM 06/02/2017    9:02 AM  MMSE - Mini Mental State Exam  Orientation to time 5 5   Orientation  to Place 5 5   Registration 3 3   Attention/ Calculation 5 5   Recall 3 3   Language- name 2 objects 2 2   Language- repeat 1 1  Language- follow 3 step command 3 3   Language- read & follow direction 1 1   Write a sentence 1 1   Copy design 1 1   Total score 30 30      Data saved with a previous flowsheet row definition        05/22/2024    1:20 PM 05/26/2022    1:58 PM  6CIT Screen  What Year? 0 points 0 points  What month? 0 points 0 points  What time? 0 points 0 points  Count back from 20 0 points 0 points  Months in reverse 0 points 0 points  Repeat phrase 0 points 0 points  Total Score 0 points 0 points    Immunizations Immunization History  Administered Date(s) Administered   Influenza Split 08/03/2011   Influenza Whole 08/15/2007, 07/05/2008, 07/29/2009, 08/19/2010, 08/19/2012   Influenza,inj,Quad PF,6+ Mos 07/23/2013, 06/12/2014, 06/02/2015   Influenza-Unspecified 06/02/2016, 06/23/2019, 07/01/2022   PPD Test 06/07/2011, 06/13/2012, 09/14/2013   Pneumococcal Conjugate-13 06/04/2019   Pneumococcal Polysaccharide-23 07/23/2013   Tdap 09/13/2011    Screening Tests Health Maintenance  Topic Date Due   FOOT EXAM  Never done   OPHTHALMOLOGY EXAM  Never done   Pneumococcal Vaccine: 50+ Years (3 of 3 - PCV20 or PCV21) 07/30/2019   HEMOGLOBIN A1C  02/21/2021   Medicare Annual Wellness (AWV)  05/27/2023   Influenza Vaccine  12/04/2024 (Originally 04/06/2024)   DTaP/Tdap/Td (2 - Td or Tdap) 05/22/2025 (Originally 09/12/2021)   Mammogram  05/22/2025 (Originally 10/13/2019)   DEXA SCAN  05/22/2025 (Originally 10/30/2022)   Colonoscopy  07/21/2027   Hepatitis C Screening  Completed   HPV VACCINES  Aged Out   Meningococcal B  Vaccine  Aged Out   COVID-19 Vaccine  Discontinued   Zoster Vaccines- Shingrix  Discontinued    Health Maintenance Items Addressed: Pt declines further mammograms.  Wants to hold off on DEXA until next year. Declines: tetanus, flu and pneumonia  vaccines.  Additional Screening:  Vision Screening: Recommended annual ophthalmology exams for early detection of glaucoma and other disorders of the eye. Is the patient up to date with their annual eye exam?  No  Who is the provider or what is the name of the office in which the patient attends annual eye exams? Needs new doctor, will send options to pt to contact  Dental Screening: Recommended annual dental exams for proper oral hygiene  Community Resource Referral / Chronic Care Management: CRR required this visit?  No   CCM required this visit?  No   Plan:    I have personally reviewed and noted the following in the patient's chart:   Medical and social history Use of alcohol, tobacco or illicit drugs  Current medications and supplements including opioid prescriptions. Patient is not currently taking opioid prescriptions. Functional ability and status Nutritional status Physical activity Advanced directives List of other physicians Hospitalizations, surgeries, and ER visits in previous 12 months Vitals Screenings to include cognitive, depression, and falls Referrals and appointments  In addition, I have reviewed and discussed with patient certain preventive protocols, quality metrics, and best practice recommendations. A written personalized care plan for preventive services as well as general preventive health recommendations were provided to patient.   Lolita Libra, CMA   05/22/2024   After Visit Summary: (MyChart) Due to this being a telephonic visit, the after visit summary with patients personalized plan was offered to patient via MyChart   Notes: Nothing significant to report

## 2024-05-22 NOTE — Patient Instructions (Addendum)
 [name] , Thank you for taking time out of your busy schedule to complete your Annual Wellness Visit with me. I enjoyed our conversation and look forward to speaking with you again next year. I, as well as your care team,  appreciate your ongoing commitment to your health goals. Please review the following plan we discussed and let me know if I can assist you in the future. Your Game plan/ To Do List    Referrals: If you haven't heard from the office you've been referred to, please reach out to them at the phone provided.  [facility] [address] [place], [place] [address] Phone:  [phone]     Or  [facility] (in [place]) [address]      [place], [place] [address]    Phone:  [phone]      Follow up Visits: Next Medicare AWV with our clinical staff: 05/29/25 1pm, telephone    Next Office Visit with your provider: 09/04/24 10am, Dr [name]  Clinician Recommendations:  Aim for 30 minutes of exercise or brisk walking, 6-8 glasses of water , and 5 servings of fruits and vegetables each day.   You will need to get the following vaccines at your local pharmacy (if you change your mind): Flu, pneumonia, tetanus      This is a list of the screening recommended for you and due dates:  Health Maintenance  Topic Date Due   Complete foot exam   Never done   Eye exam for diabetics  Never done   Pneumococcal Vaccine for age over 69 (3 of 3 - PCV20 or PCV21) 07/30/2019   Hemoglobin A1C  02/21/2021   Medicare Annual Wellness Visit  05/27/2023   Flu Shot  12/04/2024*   DTaP/Tdap/Td vaccine (2 - Td or Tdap) 05/22/2025*   Breast Cancer Screening  05/22/2025*   DEXA scan (bone density measurement)  05/22/2025*   Colon Cancer Screening  07/21/2027   Hepatitis C Screening  Completed   HPV Vaccine  Aged Out   Meningitis B Vaccine  Aged Out   COVID-19 Vaccine  Discontinued   Zoster (Shingles) Vaccine  Discontinued  *Topic was postponed. The date shown is not the original due  date.    Advanced directives: (Copy Requested) Please bring a copy of your health care power of attorney and living will to the office to be added to your chart at your convenience. You can mail to [facility] [address] [place], [place] [address] or email to [email or web address] Advance Care Planning is important because it:  [x]  Makes sure you receive the medical care that is consistent with your values, goals, and preferences  [x]  It provides guidance to your family and loved ones and reduces their decisional burden about whether or not they are making the right decisions based on your wishes.  Follow the link provided in your after visit summary or read over the paperwork we have mailed to you to help you started getting your Advance Directives in place. If you need assistance in completing these, please reach out to us  so that we can help you!  See attachments for Preventive Care and Fall Prevention Tips.

## 2024-05-23 ENCOUNTER — Ambulatory Visit (INDEPENDENT_AMBULATORY_CARE_PROVIDER_SITE_OTHER)

## 2024-05-23 VITALS — BP 148/82 | HR 93 | Temp 97.7°F | Resp 16 | Ht 63.0 in | Wt 177.0 lb

## 2024-05-23 DIAGNOSIS — E860 Dehydration: Secondary | ICD-10-CM | POA: Diagnosis not present

## 2024-05-23 DIAGNOSIS — K50819 Crohn's disease of both small and large intestine with unspecified complications: Secondary | ICD-10-CM | POA: Diagnosis not present

## 2024-05-23 DIAGNOSIS — R198 Other specified symptoms and signs involving the digestive system and abdomen: Secondary | ICD-10-CM

## 2024-05-23 MED ORDER — HEPARIN SOD (PORK) LOCK FLUSH 100 UNIT/ML IV SOLN: 250.0000 [IU] | Freq: Once | INTRAVENOUS | Status: DC | PRN

## 2024-05-23 MED ORDER — SODIUM CHLORIDE 0.9 % IV BOLUS
1000.0000 mL | Freq: Once | INTRAVENOUS | Status: AC
  Administered 2024-05-23: 1000 mL via INTRAVENOUS
  Filled 2024-05-23: qty 1000

## 2024-05-23 MED ORDER — HEPARIN SOD (PORK) LOCK FLUSH 100 UNIT/ML IV SOLN
500.0000 [IU] | Freq: Once | INTRAVENOUS | Status: AC | PRN
  Administered 2024-05-23: 500 [IU]
  Filled 2024-05-23: qty 5

## 2024-05-23 MED ORDER — OCTREOTIDE ACETATE 20 MG IM KIT
20.0000 mg | PACK | Freq: Once | INTRAMUSCULAR | Status: DC
  Filled 2024-05-23: qty 1

## 2024-05-23 NOTE — Progress Notes (Signed)
 Diagnosis: Dehydration  Provider:  Mannam, Praveen MD  Procedure: IV Infusion  IV Type: Port a Cath, IV Location: L Chest  Normal Saline, Dose: 1000 ml  Infusion Start Time: 0834  Infusion Stop Time: 0941  Post Infusion IV Care: Port a Cath Deaccessed/Flushed and heparin  locked  Discharge: Condition: Good, Destination: Home . AVS Declined  Performed by:  Rocky FORBES Sar, RN

## 2024-05-24 ENCOUNTER — Other Ambulatory Visit: Payer: Self-pay | Admitting: Family Medicine

## 2024-05-24 ENCOUNTER — Ambulatory Visit: Payer: Self-pay | Admitting: *Deleted

## 2024-05-24 DIAGNOSIS — A6000 Herpesviral infection of urogenital system, unspecified: Secondary | ICD-10-CM

## 2024-05-24 MED ORDER — VALACYCLOVIR HCL 1 G PO TABS: 1000.0000 mg | ORAL_TABLET | Freq: Three times a day (TID) | ORAL | 0 refills | Status: AC

## 2024-05-24 NOTE — Telephone Encounter (Signed)
 Offered appt today with other provider and patient declined. Reports she is too stressed and last OV 05/03/24 . Please advise. Patient requesting call back.      FYI Only or Action Required?: Action required by provider: clinical question for provider, update on patient condition, and requesting medication for herpes outbreak .  Patient was last seen in primary care on 05/03/2024 by [name], Jeanette SAUNDERS, DO.  Called Nurse Triage reporting Mouth Lesions.  Symptoms began several weeks ago.  Interventions attempted: Rest, hydration, or home remedies.  Symptoms are: gradually worsening.  Triage Disposition: See Physician Within 24 Hours, See HCP Within 4 Hours (Or PCP Triage)  Patient/caregiver understands and will follow disposition?: No, wishes to speak with PCP          Copied from CRM #[id number]. Topic: Clinical - Red Word Triage >> May 24, 2024  2:07 PM [name] wrote: Red Word that prompted transfer to Nurse Triage: Patient is wanting to get a prescription for herpes outbreak. Outbreak have went from blisters to open sores and bump. Patient has been itching as well. Patient does not want to come in for an appt. Bumps are on her face and vagina area.  Pharmacy: [id number] - [place], [place] - [address] [place] [address] Phone: [phone] Fax: [phone] Hours: Not open 24 hours Reason for Disposition  [1] Looks infected (e.g., spreading redness, pus) AND [2] diabetes mellitus or weak immune system (e.g., HIV positive, cancer chemo, splenectomy, organ transplant, chronic steroids)  MODERATE-SEVERE itching (i.e., interferes with school, work, or sleep)  Answer Assessment - Initial Assessment Questions Offered appt today with other provider. None available with PCP. Paitent declined and reports she is under stress and last OV 05/03/24 PCP aware of stress. Patient requesting medication for herpes outbreak patient reports I know exactly  what it is  and has not had out break in many years. Please advise.   See other protocol      1. APPEARANCE of SORES: What do the sores look like?     Open sores to vaginal area and new sores noted to chin and around mouth 2. NUMBER: How many sores are there?     Did not report 3. SIZE: How big is the largest sore?     Size of end of eraser 4. LOCATION: Where are the sores located?     Around mouth chin, face area and vaginal area  5. ONSET: When did the sores begin?     Couple of weeks ago  6. TENDER: Does it hurt when you touch it?  (Scale 1-10; or mild, moderate, severe)      Itching , severe.  Reports sores to vaginal area open 7. CAUSE: What do you think is causing the sores?     Herpes outbreak 8. OTHER SYMPTOMS: Do you have any other symptoms? (e.g., fever, new weakness)    Stress  Answer Assessment - Initial Assessment Questions Requesting medication and declined appt today. See previous protocol.      1. SYMPTOM: What's the main symptom you're concerned about? (e.g., rash, itching, swelling, dryness)     Itching open sores 2. LOCATION: Where is the  sores  located? (e.g., inside/outside, left/right)     Vaginal area 3. ONSET: When did the  sx   start?     Couple of weeks ago  4. PAIN: Is there any pain? If Yes, ask: How bad is it? (Scale: 0-10; none, mild, moderate, severe)  Severe itching 5. CAUSE: What do you think is causing the symptoms?     herpes 6. OTHER SYMPTOMS: Do you have any other symptoms? (e.g., fever, vaginal bleeding, pain with urination)     Sores size of eraser open. Itching. Sores noted to chin face around mouth not open  7. PREGNANCY: Is there any chance you are pregnant? When was your last menstrual period?     na  Protocols used: Sores-A-AH, Vulvar Symptoms-A-AH

## 2024-05-24 NOTE — Telephone Encounter (Signed)
**Note De-identified  [place] Obfuscation** Please advise 

## 2024-05-25 ENCOUNTER — Ambulatory Visit

## 2024-05-25 VITALS — BP 108/66 | HR 85 | Temp 98.3°F | Resp 20 | Ht 63.0 in | Wt 177.2 lb

## 2024-05-25 DIAGNOSIS — E860 Dehydration: Secondary | ICD-10-CM | POA: Diagnosis not present

## 2024-05-25 DIAGNOSIS — K50819 Crohn's disease of both small and large intestine with unspecified complications: Secondary | ICD-10-CM | POA: Diagnosis not present

## 2024-05-25 DIAGNOSIS — R198 Other specified symptoms and signs involving the digestive system and abdomen: Secondary | ICD-10-CM

## 2024-05-25 MED ORDER — OCTREOTIDE ACETATE 20 MG IM KIT
20.0000 mg | PACK | Freq: Once | INTRAMUSCULAR | Status: AC
  Administered 2024-05-25: 20 mg via INTRAMUSCULAR
  Filled 2024-05-25: qty 1

## 2024-05-25 MED ORDER — HEPARIN SOD (PORK) LOCK FLUSH 100 UNIT/ML IV SOLN
500.0000 [IU] | Freq: Once | INTRAVENOUS | Status: AC | PRN
  Administered 2024-05-25: 500 [IU]
  Filled 2024-05-25: qty 5

## 2024-05-25 MED ORDER — SODIUM CHLORIDE 0.9 % IV BOLUS
1000.0000 mL | Freq: Once | INTRAVENOUS | Status: AC
  Administered 2024-05-25: 1000 mL via INTRAVENOUS
  Filled 2024-05-25: qty 1000

## 2024-05-25 NOTE — Progress Notes (Signed)
 Diagnosis: Dehydration  Provider:  Mannam, Praveen MD  Procedure: IV Infusion  IV Type: Port a Cath, IV Location: R Chest  Normal Saline, Dose: 1000 ml  Infusion Start Time: 0832  Infusion Stop Time: 0940  Post Infusion IV Care: Port a Cath Deaccessed/Flushed  Discharge: Condition: Good, Destination: Home . AVS Declined  Performed by:  Rocky FORBES Sar, RN    Diagnosis: Crohn's Disease  Provider:  Mannam, Praveen MD  Procedure: Injection  Octreotide , Dose: 20 mg, Site: intramuscular, Number of injections: 1  Injection Site(s): Right upper quad. gluteus  Post Care: Patient declined observation  Discharge: Condition: Good, Destination: Home . AVS Declined  Performed by:  Maximiano Pouch, LPN

## 2024-05-28 ENCOUNTER — Ambulatory Visit (INDEPENDENT_AMBULATORY_CARE_PROVIDER_SITE_OTHER)

## 2024-05-28 VITALS — BP 133/77 | HR 85 | Temp 97.5°F | Resp 18 | Ht 63.0 in | Wt 175.8 lb

## 2024-05-28 DIAGNOSIS — E860 Dehydration: Secondary | ICD-10-CM | POA: Diagnosis not present

## 2024-05-28 MED ORDER — HEPARIN SOD (PORK) LOCK FLUSH 100 UNIT/ML IV SOLN
500.0000 [IU] | Freq: Once | INTRAVENOUS | Status: AC | PRN
  Administered 2024-05-28: 500 [IU]
  Filled 2024-05-28: qty 5

## 2024-05-28 MED ORDER — HEPARIN SOD (PORK) LOCK FLUSH 100 UNIT/ML IV SOLN: 250.0000 [IU] | Freq: Once | INTRAVENOUS | Status: DC | PRN

## 2024-05-28 MED ORDER — SODIUM CHLORIDE 0.9 % IV BOLUS
1000.0000 mL | Freq: Once | INTRAVENOUS | Status: AC
  Administered 2024-05-28: 1000 mL via INTRAVENOUS
  Filled 2024-05-28: qty 1000

## 2024-05-28 NOTE — Progress Notes (Signed)
 Diagnosis: Dehydration  Provider:  Praveen Mannam MD  Procedure: IV Infusion  IV Type: Port a Cath, IV Location: R Chest  Normal Saline, Dose: 1000 ml  Infusion Start Time: 0836  Infusion Stop Time: 0940  Post Infusion IV Care: Port a Cath Deaccessed/Flushed and heparin  locked.  Discharge: Condition: Good, Destination: Home . AVS Declined  Performed by:  Eduardo Honor, RN

## 2024-05-29 ENCOUNTER — Telehealth: Payer: Self-pay

## 2024-05-29 NOTE — Telephone Encounter (Signed)
 Patient scheduled for appointment on 12-17 at 9:20 am with Dr. Leigh.  Added to waitlist.  MyChart message to patient with appointment info and asked to call if she needs to reschedule

## 2024-05-29 NOTE — Telephone Encounter (Signed)
-----   Message from Elspeth SHAUNNA Naval sent at 05/29/2024 12:26 PM EDT ----- Regarding: RE: outpatient follow up Let's get her on with me in December and can you add her to wait list for cancellation is something arises in the interim? Thanks ----- Message ----- From: Edmundo Clarita HERO, CMA Sent: 05/29/2024   8:15 AM EDT To: Elspeth SHAUNNA Naval, MD Subject: RE: outpatient follow up                       You don't have any openings until December. Is that OK?  She could see an APP in November. ? ----- Message ----- From: Naval Elspeth SHAUNNA, MD Sent: 05/28/2024   5:50 PM EDT To: Clarita HERO Edmundo, CMA Subject: outpatient follow up                           Jan this patient needs routine outpatient follow up with me for high ostomy output. Thanks

## 2024-05-30 ENCOUNTER — Ambulatory Visit

## 2024-05-30 VITALS — BP 119/72 | Temp 97.7°F | Resp 22 | Ht 63.0 in | Wt 175.0 lb

## 2024-05-30 DIAGNOSIS — E860 Dehydration: Secondary | ICD-10-CM | POA: Diagnosis not present

## 2024-05-30 MED ORDER — SODIUM CHLORIDE 0.9 % IV BOLUS
1000.0000 mL | Freq: Once | INTRAVENOUS | Status: AC
  Administered 2024-05-30: 1000 mL via INTRAVENOUS
  Filled 2024-05-30: qty 1000

## 2024-05-30 MED ORDER — HEPARIN SOD (PORK) LOCK FLUSH 100 UNIT/ML IV SOLN: 250.0000 [IU] | Freq: Once | INTRAVENOUS | Status: DC | PRN

## 2024-05-30 MED ORDER — HEPARIN SOD (PORK) LOCK FLUSH 100 UNIT/ML IV SOLN
500.0000 [IU] | Freq: Once | INTRAVENOUS | Status: AC | PRN
  Administered 2024-05-30: 500 [IU]
  Filled 2024-05-30: qty 5

## 2024-05-30 NOTE — Progress Notes (Signed)
 Diagnosis: Dehydration  Provider:  Mannam, Praveen MD  Procedure: IV Infusion  IV Type: Port a Cath, IV Location: R Chest  Normal Saline, Dose: 1000 ml  Infusion Start Time: 0846  Infusion Stop Time: 0956  Post Infusion IV Care: Port a Cath Deaccessed/Flushed and heparin  locked  Discharge: Condition: Good, Destination: Home . AVS Declined  Performed by:  Rocky FORBES Sar, RN

## 2024-06-01 ENCOUNTER — Ambulatory Visit: Admitting: *Deleted

## 2024-06-01 ENCOUNTER — Other Ambulatory Visit: Payer: Self-pay | Admitting: Family Medicine

## 2024-06-01 VITALS — BP 146/86 | HR 89 | Temp 98.0°F | Resp 20 | Ht 63.0 in | Wt 176.0 lb

## 2024-06-01 DIAGNOSIS — E860 Dehydration: Secondary | ICD-10-CM | POA: Diagnosis not present

## 2024-06-01 DIAGNOSIS — F322 Major depressive disorder, single episode, severe without psychotic features: Secondary | ICD-10-CM

## 2024-06-01 MED ORDER — HEPARIN SOD (PORK) LOCK FLUSH 100 UNIT/ML IV SOLN
500.0000 [IU] | Freq: Once | INTRAVENOUS | Status: AC | PRN
  Administered 2024-06-01: 500 [IU]
  Filled 2024-06-01: qty 5

## 2024-06-01 MED ORDER — SODIUM CHLORIDE 0.9 % IV BOLUS
1000.0000 mL | Freq: Once | INTRAVENOUS | Status: AC
  Administered 2024-06-01: 1000 mL via INTRAVENOUS
  Filled 2024-06-01: qty 1000

## 2024-06-01 NOTE — Progress Notes (Signed)
 Diagnosis: Dehydration  Provider:  Mannam, Praveen MD  Procedure: IV Infusion  IV Type: Port a Cath, IV Location: R Chest  Normal Saline, Dose: 1000 ml  Infusion Start Time: 0827  Infusion Stop Time: 0932  Post Infusion IV Care: Patient declined observation and Port a Cath Deaccessed/Flushed  Discharge: Condition: Good, Destination: Home . AVS Declined  Performed by:  Rocky FORBES Sar, RN

## 2024-06-04 ENCOUNTER — Ambulatory Visit

## 2024-06-04 VITALS — BP 151/81 | HR 85 | Temp 97.9°F | Resp 18 | Ht 63.0 in | Wt 174.0 lb

## 2024-06-04 DIAGNOSIS — E860 Dehydration: Secondary | ICD-10-CM | POA: Diagnosis not present

## 2024-06-04 MED ORDER — SODIUM CHLORIDE 0.9 % IV BOLUS
1000.0000 mL | Freq: Once | INTRAVENOUS | Status: AC
  Administered 2024-06-04: 1000 mL via INTRAVENOUS
  Filled 2024-06-04: qty 1000

## 2024-06-04 MED ORDER — HEPARIN SOD (PORK) LOCK FLUSH 100 UNIT/ML IV SOLN
500.0000 [IU] | Freq: Once | INTRAVENOUS | Status: AC | PRN
  Administered 2024-06-04: 500 [IU]
  Filled 2024-06-04: qty 5

## 2024-06-04 MED ORDER — HEPARIN SOD (PORK) LOCK FLUSH 100 UNIT/ML IV SOLN: 250.0000 [IU] | Freq: Once | INTRAVENOUS | Status: DC | PRN

## 2024-06-04 NOTE — Progress Notes (Signed)
 Diagnosis: Dehydration  Provider:  Praveen Mannam MD  Procedure: IV Infusion  IV Type: Port a Cath, IV Location: R Chest  Normal Saline, Dose: 1000 ml  Infusion Start Time: 0828  Infusion Stop Time: 0934  Post Infusion IV Care: Port a Cath Deaccessed/Flushed and heparin  locked.  Discharge: Condition: Good, Destination: Home . AVS Declined  Performed by:  Elmira Olkowski, RN

## 2024-06-06 ENCOUNTER — Ambulatory Visit (INDEPENDENT_AMBULATORY_CARE_PROVIDER_SITE_OTHER)

## 2024-06-06 VITALS — BP 161/88 | HR 92 | Temp 98.3°F | Resp 20 | Ht 63.0 in | Wt 174.0 lb

## 2024-06-06 DIAGNOSIS — E860 Dehydration: Secondary | ICD-10-CM

## 2024-06-06 MED ORDER — HEPARIN SOD (PORK) LOCK FLUSH 100 UNIT/ML IV SOLN
500.0000 [IU] | Freq: Once | INTRAVENOUS | Status: AC | PRN
  Administered 2024-06-06: 500 [IU]
  Filled 2024-06-06: qty 5

## 2024-06-06 MED ORDER — HEPARIN SOD (PORK) LOCK FLUSH 100 UNIT/ML IV SOLN: 250.0000 [IU] | Freq: Once | INTRAVENOUS | Status: DC | PRN

## 2024-06-06 MED ORDER — SODIUM CHLORIDE 0.9 % IV BOLUS
1000.0000 mL | Freq: Once | INTRAVENOUS | Status: AC
  Administered 2024-06-06: 1000 mL via INTRAVENOUS
  Filled 2024-06-06: qty 1000

## 2024-06-06 NOTE — Progress Notes (Signed)
 Diagnosis: Dehydration  Provider:  Praveen Mannam MD  Procedure: IV Infusion  IV Type: Port a Cath, IV Location: R Chest  Normal Saline, Dose: 1000 ml  Infusion Start Time: 0831  Infusion Stop Time: 0938  Post Infusion IV Care: Port a Cath Deaccessed/Flushed and heparin  locked.  Discharge: Condition: Good, Destination: Home . AVS Declined  Performed by:  Kein Carlberg, RN

## 2024-06-07 ENCOUNTER — Encounter (HOSPITAL_COMMUNITY): Payer: Self-pay

## 2024-06-07 ENCOUNTER — Other Ambulatory Visit: Payer: Self-pay

## 2024-06-07 ENCOUNTER — Emergency Department (HOSPITAL_COMMUNITY)

## 2024-06-07 ENCOUNTER — Inpatient Hospital Stay (HOSPITAL_COMMUNITY)
Admission: EM | Admit: 2024-06-07 | Discharge: 2024-06-14 | DRG: 673 | Disposition: A | Discharge status: Home / Self Care | Attending: Internal Medicine | Admitting: Internal Medicine

## 2024-06-07 DIAGNOSIS — A6004 Herpesviral vulvovaginitis: Secondary | ICD-10-CM | POA: Diagnosis not present

## 2024-06-07 DIAGNOSIS — M109 Gout, unspecified: Secondary | ICD-10-CM | POA: Diagnosis not present

## 2024-06-07 DIAGNOSIS — D509 Iron deficiency anemia, unspecified: Secondary | ICD-10-CM | POA: Diagnosis not present

## 2024-06-07 DIAGNOSIS — Z886 Allergy status to analgesic agent status: Secondary | ICD-10-CM

## 2024-06-07 DIAGNOSIS — I160 Hypertensive urgency: Secondary | ICD-10-CM | POA: Diagnosis present

## 2024-06-07 DIAGNOSIS — Z6831 Body mass index (BMI) 31.0-31.9, adult: Secondary | ICD-10-CM

## 2024-06-07 DIAGNOSIS — E66811 Obesity, class 1: Secondary | ICD-10-CM | POA: Diagnosis present

## 2024-06-07 DIAGNOSIS — N762 Acute vulvitis: Secondary | ICD-10-CM | POA: Diagnosis present

## 2024-06-07 DIAGNOSIS — J159 Unspecified bacterial pneumonia: Secondary | ICD-10-CM | POA: Diagnosis present

## 2024-06-07 DIAGNOSIS — Z7901 Long term (current) use of anticoagulants: Secondary | ICD-10-CM | POA: Diagnosis not present

## 2024-06-07 DIAGNOSIS — D649 Anemia, unspecified: Secondary | ICD-10-CM | POA: Diagnosis not present

## 2024-06-07 DIAGNOSIS — E872 Acidosis, unspecified: Secondary | ICD-10-CM | POA: Diagnosis not present

## 2024-06-07 DIAGNOSIS — I120 Hypertensive chronic kidney disease with stage 5 chronic kidney disease or end stage renal disease: Secondary | ICD-10-CM | POA: Diagnosis not present

## 2024-06-07 DIAGNOSIS — Z90710 Acquired absence of both cervix and uterus: Secondary | ICD-10-CM

## 2024-06-07 DIAGNOSIS — E785 Hyperlipidemia, unspecified: Secondary | ICD-10-CM | POA: Diagnosis not present

## 2024-06-07 DIAGNOSIS — E8721 Acute metabolic acidosis: Secondary | ICD-10-CM | POA: Diagnosis present

## 2024-06-07 DIAGNOSIS — Z932 Ileostomy status: Secondary | ICD-10-CM

## 2024-06-07 DIAGNOSIS — Z79899 Other long term (current) drug therapy: Secondary | ICD-10-CM

## 2024-06-07 DIAGNOSIS — F418 Other specified anxiety disorders: Secondary | ICD-10-CM | POA: Diagnosis not present

## 2024-06-07 DIAGNOSIS — Z604 Social exclusion and rejection: Secondary | ICD-10-CM | POA: Diagnosis present

## 2024-06-07 DIAGNOSIS — E876 Hypokalemia: Secondary | ICD-10-CM | POA: Diagnosis not present

## 2024-06-07 DIAGNOSIS — D631 Anemia in chronic kidney disease: Secondary | ICD-10-CM | POA: Diagnosis not present

## 2024-06-07 DIAGNOSIS — N179 Acute kidney failure, unspecified: Secondary | ICD-10-CM | POA: Diagnosis not present

## 2024-06-07 DIAGNOSIS — Z9049 Acquired absence of other specified parts of digestive tract: Secondary | ICD-10-CM | POA: Diagnosis not present

## 2024-06-07 DIAGNOSIS — N186 End stage renal disease: Secondary | ICD-10-CM | POA: Diagnosis present

## 2024-06-07 DIAGNOSIS — Z87891 Personal history of nicotine dependence: Secondary | ICD-10-CM | POA: Diagnosis not present

## 2024-06-07 DIAGNOSIS — N189 Chronic kidney disease, unspecified: Secondary | ICD-10-CM

## 2024-06-07 DIAGNOSIS — Z8249 Family history of ischemic heart disease and other diseases of the circulatory system: Secondary | ICD-10-CM

## 2024-06-07 DIAGNOSIS — K5090 Crohn's disease, unspecified, without complications: Secondary | ICD-10-CM | POA: Diagnosis present

## 2024-06-07 DIAGNOSIS — Z1152 Encounter for screening for COVID-19: Secondary | ICD-10-CM

## 2024-06-07 DIAGNOSIS — R531 Weakness: Secondary | ICD-10-CM | POA: Diagnosis not present

## 2024-06-07 DIAGNOSIS — J189 Pneumonia, unspecified organism: Secondary | ICD-10-CM | POA: Diagnosis present

## 2024-06-07 DIAGNOSIS — R0609 Other forms of dyspnea: Secondary | ICD-10-CM | POA: Diagnosis not present

## 2024-06-07 DIAGNOSIS — I2489 Other forms of acute ischemic heart disease: Secondary | ICD-10-CM | POA: Diagnosis not present

## 2024-06-07 DIAGNOSIS — I129 Hypertensive chronic kidney disease with stage 1 through stage 4 chronic kidney disease, or unspecified chronic kidney disease: Secondary | ICD-10-CM | POA: Diagnosis not present

## 2024-06-07 DIAGNOSIS — Z882 Allergy status to sulfonamides status: Secondary | ICD-10-CM

## 2024-06-07 DIAGNOSIS — G2581 Restless legs syndrome: Secondary | ICD-10-CM | POA: Diagnosis present

## 2024-06-07 DIAGNOSIS — K90822 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: Secondary | ICD-10-CM | POA: Diagnosis present

## 2024-06-07 DIAGNOSIS — Z992 Dependence on renal dialysis: Secondary | ICD-10-CM | POA: Diagnosis not present

## 2024-06-07 DIAGNOSIS — R1030 Lower abdominal pain, unspecified: Secondary | ICD-10-CM | POA: Diagnosis not present

## 2024-06-07 DIAGNOSIS — D7589 Other specified diseases of blood and blood-forming organs: Secondary | ICD-10-CM | POA: Diagnosis present

## 2024-06-07 DIAGNOSIS — Z452 Encounter for adjustment and management of vascular access device: Secondary | ICD-10-CM | POA: Diagnosis not present

## 2024-06-07 DIAGNOSIS — R7989 Other specified abnormal findings of blood chemistry: Secondary | ICD-10-CM | POA: Diagnosis not present

## 2024-06-07 DIAGNOSIS — N185 Chronic kidney disease, stage 5: Secondary | ICD-10-CM | POA: Diagnosis not present

## 2024-06-07 DIAGNOSIS — R918 Other nonspecific abnormal finding of lung field: Secondary | ICD-10-CM | POA: Diagnosis not present

## 2024-06-07 DIAGNOSIS — E8770 Fluid overload, unspecified: Secondary | ICD-10-CM | POA: Diagnosis present

## 2024-06-07 LAB — CBC WITH DIFFERENTIAL/PLATELET
Abs Immature Granulocytes: 0.23 K/uL — ABNORMAL HIGH (ref 0.00–0.07)
Basophils Absolute: 0.1 K/uL (ref 0.0–0.1)
Basophils Relative: 0 %
Eosinophils Absolute: 0.2 K/uL (ref 0.0–0.5)
Eosinophils Relative: 1 %
HCT: 26.7 % — ABNORMAL LOW (ref 36.0–46.0)
Hemoglobin: 7.9 g/dL — ABNORMAL LOW (ref 12.0–15.0)
Immature Granulocytes: 1 %
Lymphocytes Relative: 13 %
Lymphs Abs: 2.5 K/uL (ref 0.7–4.0)
MCH: 31.1 pg (ref 26.0–34.0)
MCHC: 29.6 g/dL — ABNORMAL LOW (ref 30.0–36.0)
MCV: 105.1 fL — ABNORMAL HIGH (ref 80.0–100.0)
Monocytes Absolute: 1.5 K/uL — ABNORMAL HIGH (ref 0.1–1.0)
Monocytes Relative: 8 %
Neutro Abs: 15.4 K/uL — ABNORMAL HIGH (ref 1.7–7.7)
Neutrophils Relative %: 77 %
Platelets: 357 K/uL (ref 150–400)
RBC: 2.54 MIL/uL — ABNORMAL LOW (ref 3.87–5.11)
RDW: 16.2 % — ABNORMAL HIGH (ref 11.5–15.5)
WBC: 19.8 K/uL — ABNORMAL HIGH (ref 4.0–10.5)
nRBC: 0.2 % (ref 0.0–0.2)

## 2024-06-07 LAB — TROPONIN T, HIGH SENSITIVITY
Troponin T High Sensitivity: 51 ng/L — ABNORMAL HIGH (ref 0–19)
Troponin T High Sensitivity: 50 ng/L — ABNORMAL HIGH (ref 0–19)

## 2024-06-07 LAB — I-STAT CG4 LACTIC ACID, ED
Lactic Acid, Venous: 1.5 mmol/L (ref 0.5–1.9)
Lactic Acid, Venous: 0.9 mmol/L (ref 0.5–1.9)

## 2024-06-07 LAB — RESP PANEL BY RT-PCR (RSV, FLU A&B, COVID)  RVPGX2
Influenza A by PCR: NEGATIVE
Influenza B by PCR: NEGATIVE
Resp Syncytial Virus by PCR: NEGATIVE
SARS Coronavirus 2 by RT PCR: NEGATIVE

## 2024-06-07 LAB — COMPREHENSIVE METABOLIC PANEL WITH GFR
ALT: 22 U/L (ref 0–44)
AST: 24 U/L (ref 15–41)
Albumin: 3.8 g/dL (ref 3.5–5.0)
Alkaline Phosphatase: 174 U/L — ABNORMAL HIGH (ref 38–126)
Anion gap: 17 — ABNORMAL HIGH (ref 5–15)
BUN: 56 mg/dL — ABNORMAL HIGH (ref 8–23)
CO2: 9 mmol/L — ABNORMAL LOW (ref 22–32)
Calcium: 7.4 mg/dL — ABNORMAL LOW (ref 8.9–10.3)
Chloride: 111 mmol/L (ref 98–111)
Creatinine, Ser: 5 mg/dL — ABNORMAL HIGH (ref 0.44–1.00)
GFR, Estimated: 9 mL/min — ABNORMAL LOW (ref >60)
Glucose, Bld: 118 mg/dL — ABNORMAL HIGH (ref 70–99)
Potassium: 3.4 mmol/L — ABNORMAL LOW (ref 3.5–5.1)
Sodium: 136 mmol/L (ref 135–145)
Total Bilirubin: 0.4 mg/dL (ref 0.0–1.2)
Total Protein: 7.6 g/dL (ref 6.5–8.1)

## 2024-06-07 LAB — PRO BRAIN NATRIURETIC PEPTIDE: Pro Brain Natriuretic Peptide: 1769 pg/mL — ABNORMAL HIGH (ref <300.0)

## 2024-06-07 LAB — PROTIME-INR
INR: 1.2 (ref 0.8–1.2)
Prothrombin Time: 15.7 s — ABNORMAL HIGH (ref 11.4–15.2)

## 2024-06-07 MED ORDER — PROCHLORPERAZINE EDISYLATE 10 MG/2ML IJ SOLN: 10.0000 mg | Freq: Four times a day (QID) | INTRAMUSCULAR | Status: DC | PRN

## 2024-06-07 MED ORDER — SODIUM CHLORIDE 0.9 % IV SOLN
1.0000 g | Freq: Once | INTRAVENOUS | Status: AC
  Administered 2024-06-07: 1 g via INTRAVENOUS
  Filled 2024-06-07: qty 10

## 2024-06-07 MED ORDER — AZITHROMYCIN 250 MG PO TABS
500.0000 mg | ORAL_TABLET | Freq: Once | ORAL | Status: AC
  Administered 2024-06-07: 500 mg via ORAL
  Filled 2024-06-07: qty 2

## 2024-06-07 MED ORDER — GUAIFENESIN-DM 100-10 MG/5ML PO SYRP
5.0000 mL | ORAL_SOLUTION | ORAL | Status: DC | PRN
  Administered 2024-06-08 – 2024-06-10 (×3): 5 mL via ORAL
  Filled 2024-06-07 (×2): qty 5
  Filled 2024-06-07: qty 10
  Filled 2024-06-07 (×2): qty 5

## 2024-06-07 MED ORDER — POLYETHYLENE GLYCOL 3350 17 G PO PACK: 17.0000 g | PACK | Freq: Every day | ORAL | Status: DC | PRN

## 2024-06-07 MED ORDER — OXYCODONE HCL 5 MG PO TABS
5.0000 mg | ORAL_TABLET | Freq: Four times a day (QID) | ORAL | Status: AC | PRN
  Administered 2024-06-08 – 2024-06-13 (×2): 5 mg via ORAL
  Filled 2024-06-07 (×2): qty 1

## 2024-06-07 MED ORDER — ACETAMINOPHEN 325 MG PO TABS: 650.0000 mg | ORAL_TABLET | Freq: Four times a day (QID) | ORAL | Status: DC | PRN

## 2024-06-07 MED ORDER — VALACYCLOVIR HCL 500 MG PO TABS
500.0000 mg | ORAL_TABLET | Freq: Every day | ORAL | Status: DC
  Administered 2024-06-07: 500 mg via ORAL
  Filled 2024-06-07: qty 1

## 2024-06-07 MED ORDER — MELATONIN 5 MG PO TABS
5.0000 mg | ORAL_TABLET | Freq: Every evening | ORAL | Status: DC | PRN
  Filled 2024-06-07 (×3): qty 1

## 2024-06-07 MED ORDER — CEFTRIAXONE SODIUM 1 G IJ SOLR
1.0000 g | INTRAMUSCULAR | Status: AC
  Administered 2024-06-08 – 2024-06-12 (×5): 1 g via INTRAVENOUS
  Filled 2024-06-07 (×5): qty 10

## 2024-06-07 MED ORDER — SODIUM CHLORIDE 0.9 % IV BOLUS
500.0000 mL | Freq: Once | INTRAVENOUS | Status: AC
  Administered 2024-06-07: 500 mL via INTRAVENOUS

## 2024-06-07 MED ORDER — LACTATED RINGERS IV BOLUS (SEPSIS)
500.0000 mL | Freq: Once | INTRAVENOUS | Status: AC
  Administered 2024-06-07: 500 mL via INTRAVENOUS

## 2024-06-07 MED ORDER — VALACYCLOVIR HCL 500 MG PO TABS
500.0000 mg | ORAL_TABLET | Freq: Every day | ORAL | Status: DC
  Administered 2024-06-08 – 2024-06-14 (×7): 500 mg via ORAL
  Filled 2024-06-07 (×7): qty 1

## 2024-06-07 MED ORDER — IPRATROPIUM-ALBUTEROL 0.5-2.5 (3) MG/3ML IN SOLN: 3.0000 mL | RESPIRATORY_TRACT | Status: DC | PRN

## 2024-06-07 MED ORDER — AZITHROMYCIN 500 MG PO TABS
500.0000 mg | ORAL_TABLET | Freq: Every day | ORAL | Status: DC
  Administered 2024-06-08 – 2024-06-10 (×3): 500 mg via ORAL
  Filled 2024-06-07: qty 1
  Filled 2024-06-07: qty 2
  Filled 2024-06-07: qty 1

## 2024-06-07 MED ORDER — HEPARIN SODIUM (PORCINE) 5000 UNIT/ML IJ SOLN
5000.0000 [IU] | Freq: Three times a day (TID) | INTRAMUSCULAR | Status: DC
  Administered 2024-06-07 – 2024-06-11 (×13): 5000 [IU] via SUBCUTANEOUS
  Filled 2024-06-07 (×13): qty 1

## 2024-06-07 NOTE — ED Provider Notes (Signed)
 [place] EMERGENCY DEPARTMENT AT [facility] Provider Note   CSN: [id number] Arrival date & time: 06/07/24  1826        [name] is a 68 y.o. female.      Patient has a history of restless leg syndrome, pulmonary nodule, ulcerative colitis, chronic kidney disease, Crohn's disease, HSV, IBD, status post colostomy, sepsis.  Patient presents to the ED with complaints of weakness cough myalgias rash on her right breast and vaginal area.  Patient states she has been having trouble with increasing weakness.  Few days ago she started having a genital herpes outbreak.  Patient states she also started having a cough and feeling short of breath.  She also noticed redness on her right breast and a rash on that area.  Prior to Admission medications   Medication Sig Start Date End Date Taking? Authorizing Provider  acetaminophen  (TYLENOL ) 500 MG tablet Take 1,000 mg by mouth every 6 (six) hours as needed (for pain/sleep).    [provider]  allopurinol  (ZYLOPRIM ) 300 MG tablet TAKE 1/2 TABLET (150 mg total) BY MOUTH DAILY AT 9PM 12/05/23   Lowne Chase, Yvonne R, DO  amLODipine  (NORVASC ) 5 MG tablet Take 5 mg by mouth daily. 10/13/23   [provider]  apixaban  (ELIQUIS ) 5 MG TABS tablet Take 1 tablet (5 mg total) by mouth 2 (two) times daily. Patient not taking: Reported on 05/22/2024 02/03/23 05/03/24  Arlice Reichert, MD  Calcium  Carbonate Antacid (TUMS EXTRA STRENGTH 750 PO) Take 1-2 tablets by mouth every 6 (six) hours as needed (CHEW for heartburn).    [provider]  dicyclomine  (BENTYL ) 10 MG capsule Take 1 capsule (10 mg total) by mouth every 8 (eight) hours as needed for spasms. 10/25/23   Armbruster, Elspeth SQUIBB, MD  DULoxetine  (CYMBALTA ) 60 MG capsule TAKE ONE CAPSULE (60 MG TOTAL) BY MOUTH DAILY AT 9AM (PATIENT NEEDS FURTHER EVALUATION AND/OR LABORATORY TESTING BEFORE FURTHER REFILLS ARE GIVEN) 06/01/24   Antonio Cyndee Jamee JONELLE, DO  folic acid  (FOLVITE ) 1  MG tablet Take 2 tablets (2 mg total) by mouth daily. 05/26/17   Timmy Maude JONELLE, MD  GAS-X EXTRA STRENGTH 125 MG CAPS Take 125 mg by mouth at bedtime. Patient not taking: Reported on 05/22/2024    [provider]  metoprolol  succinate (TOPROL -XL) 100 MG 24 hr tablet Take 100 mg by mouth at bedtime.    [provider]  octreotide  (SANDOSTATIN  LAR DEPOT) 20 MG injection Inject 20 mg into the muscle every 28 (twenty-eight) days. 11/14/23   Armbruster, Elspeth SQUIBB, MD  OVER THE COUNTER MEDICATION Take 1 tablet by mouth See admin instructions. Nervive Nerve Health tablet- Take 1 tablet by mouth at bedtime    [provider]  Psyllium (METAMUCIL PREMIUM BLEND) 52.63 % POWD Take Daily 06/03/23   Armbruster, Elspeth SQUIBB, MD  Vitamin D , Ergocalciferol , (DRISDOL ) 1.25 MG (50000 UNIT) CAPS capsule Take 50,000 Units by mouth every 30 (thirty) days.    [provider]    Allergies: Sulfa antibiotics, Morphine, and Sulfonamide derivatives    Review of Systems  Genitourinary:  Positive for vaginal pain.    Updated Vital Signs BP (!) 89/71   Pulse 99   Temp 98 F (36.7 C)   Resp 15   Ht 1.6 m (5' 3)   Wt 78 kg   SpO2 100%   BMI 30.46 kg/m   Physical Exam Vitals and nursing note reviewed.  Constitutional:  Appearance: She is well-developed. She is ill-appearing.  HENT:     Head: Normocephalic and atraumatic.     Right Ear: External ear normal.     Left Ear: External ear normal.  Eyes:     General: No scleral icterus.       Right eye: No discharge.        Left eye: No discharge.     Conjunctiva/sclera: Conjunctivae normal.  Neck:     Trachea: No tracheal deviation.  Cardiovascular:     Rate and Rhythm: Normal rate and regular rhythm.  Pulmonary:     Effort: Pulmonary effort is normal. No respiratory distress.     Breath sounds: Normal breath sounds. No stridor. No wheezing or rales.  Chest:     Comments: Mild erythema noted around a small papule on the  right breast Abdominal:     General: Bowel sounds are normal. There is no distension.     Palpations: Abdomen is soft.     Tenderness: There is no abdominal tenderness. There is no guarding or rebound.     Comments: Colostomy bag with  Genitourinary:    Comments: Vesicular lesions consistent with genital herpes Musculoskeletal:        General: No tenderness or deformity.     Cervical back: Neck supple.  Skin:    General: Skin is warm and dry.     Findings: No rash.  Neurological:     General: No focal deficit present.     Mental Status: She is alert.     Cranial Nerves: No cranial nerve deficit, dysarthria or facial asymmetry.     Sensory: No sensory deficit.     Motor: No abnormal muscle tone or seizure activity.     Coordination: Coordination normal.  Psychiatric:        Mood and Affect: Mood normal.     (all labs ordered are listed, but only abnormal results are displayed) Labs Reviewed  COMPREHENSIVE METABOLIC PANEL WITH GFR - Abnormal; Notable for the following components:      Result Value   Potassium 3.4 (*)    CO2 9 (*)    Glucose, Bld 118 (*)    BUN 56 (*)    Creatinine, Ser 5.00 (*)    Calcium  7.4 (*)    Alkaline Phosphatase 174 (*)    GFR, Estimated 9 (*)    Anion gap 17 (*)    All other components within normal limits  CBC WITH DIFFERENTIAL/PLATELET - Abnormal; Notable for the following components:   WBC 19.8 (*)    RBC 2.54 (*)    Hemoglobin 7.9 (*)    HCT 26.7 (*)    MCV 105.1 (*)    MCHC 29.6 (*)    RDW 16.2 (*)    Neutro Abs 15.4 (*)    Monocytes Absolute 1.5 (*)    Abs Immature Granulocytes 0.23 (*)    All other components within normal limits  PROTIME-INR - Abnormal; Notable for the following components:   Prothrombin Time 15.7 (*)    All other components within normal limits  PRO BRAIN NATRIURETIC PEPTIDE - Abnormal; Notable for the following components:   Pro Brain Natriuretic Peptide 1,769.0 (*)    All other components within normal limits   TROPONIN T, HIGH SENSITIVITY - Abnormal; Notable for the following components:   Troponin T High Sensitivity 51 (*)    All other components within normal limits  TROPONIN T, HIGH SENSITIVITY - Abnormal; Notable for the following components:  Troponin T High Sensitivity 50 (*)    All other components within normal limits  RESP PANEL BY RT-PCR (RSV, FLU A&B, COVID)  [id number]  CULTURE, BLOOD (ROUTINE X 2)  CULTURE, BLOOD (ROUTINE X 2)  URINALYSIS, W/ REFLEX TO CULTURE (INFECTION SUSPECTED)  I-STAT CG4 LACTIC ACID, ED  I-STAT CG4 LACTIC ACID, ED  POC OCCULT BLOOD, ED    EKG: EKG Interpretation Date/Time:  Thursday June 07 2024 18:39:09 EDT Ventricular Rate:  101 PR Interval:  183 QRS Duration:  101 QT Interval:  366 QTC Calculation: 475 R Axis:   -53  Text Interpretation: Sinus tachycardia Abnormal R-wave progression, late transition Left ventricular hypertrophy Inferior infarct, old Artifact in lead(s) I II III aVR aVL aVF V1 V3 V4 V5 V6 Since last tracing rate faster Confirmed by Randol Simmonds [phone]) on 06/07/2024 6:57:42 PM  Radiology: [facility] Chest Port 1 View Result Date: 06/07/2024 CLINICAL DATA:  Questionable sepsis - evaluate for abnormality EXAM: PORTABLE CHEST 1 VIEW COMPARISON:  Chest x-ray 09/13/2023, CT chest 02/15/2023 FINDINGS: Right chest wall Port-A-Cath with tip at the superior cavoatrial junction. The heart and mediastinal contours are within normal limits. Question left base airspace opacity. No pulmonary edema. No pleural effusion. No pneumothorax. No acute osseous abnormality. IMPRESSION: Question left base airspace opacity. Recommend repeat chest x-ray PA and lateral view for further evaluation. Electronically Signed   By: Morgane  Naveau M.D.   On: 06/07/2024 19:22     Procedures   Medications Ordered in the ED  cefTRIAXone  (ROCEPHIN ) 1 g in sodium chloride  0.9 % 100 mL IVPB (1 g Intravenous New Bag/Given 06/07/24 2203)  sodium chloride  0.9 % bolus 500 mL (has no  administration in time range)  lactated ringers  bolus 500 mL (0 mLs Intravenous Stopped 06/07/24 2139)  azithromycin (ZITHROMAX) tablet 500 mg (500 mg Oral Given 06/07/24 2203)    Clinical Course as of 06/07/24 2218  Thu Jun 07, 2024  2018 CBC with Differential(!) White blood cell count elevated hemoglobin decreased [JK]  2106 Pro Brain natriuretic peptide(!) BNP elevated 1769 [JK]  2107 Troponin T, High Sensitivity(!) Opponent elevated [JK]  2107 Comprehensive metabolic panel(!) Creatinine elevated bicarb decreased, similar to previous values [JK]  2107 Chest x-ray shows possible left base airspace opacity [JK]  2152 Pro Brain natriuretic peptide(!) BNP elevated [JK]    Clinical Course User Index [JK] Randol Simmonds, MD                                 Medical Decision Making Problems Addressed: Chronic kidney disease, unspecified CKD stage: chronic illness or injury Herpes simplex vulvovaginitis: acute illness or injury Pneumonia of left lower lobe due to infectious organism: acute illness or injury  Amount and/or Complexity of Data Reviewed Labs: ordered. Decision-making details documented in ED Course. Radiology: ordered and independent interpretation performed.  Risk Prescription drug management. Decision regarding hospitalization.   Patient presented to the ED with complaints of weakness cough shortness of breath as well as a rash in the genital region.  Exam does suggest a component of genital herpes.  Labs however notable for elevated BNP leukocytosis.  No evidence of lactic acidosis.  Patient has remained hemodynamically stable.  I suspect patient may have a component of pneumonia as evidenced on the x-ray.  She was started on IV antibiotics.  No clear signs of CHF on x-ray but with her chronic kidney disease elevated BNP I do think she would  benefit for further evaluation possible echocardiogram.  With her comorbidities we will plan admission in the hospital for  further treatment.  Case discussed Dr. Shona     Final diagnoses:  Pneumonia of left lower lobe due to infectious organism  Chronic kidney disease, unspecified CKD stage  Herpes simplex vulvovaginitis    ED Discharge Orders     None          Randol Simmonds, MD 06/07/24 2220

## 2024-06-07 NOTE — ED Notes (Signed)
 Pt unable to provide urine sample at this time. BSC at bedside. Instructed pt to call when able.

## 2024-06-07 NOTE — ED Triage Notes (Addendum)
 Patient BIB [facility] from home. Complaining of genital herpes. Was prescribed antibiotics and started them yesterday. Stated her rash has been draining and itches. Stated her family called because she has been out of it and feeling weak. Has been short of breath with a cough.

## 2024-06-07 NOTE — H&P (Signed)
 History and Physical  [name] FMW:[id number] DOB: 13-Dec-1955 DOA: 06/07/2024  Referring physician: Dr. Randol, EDP  PCP: [name], [name], DO  Outpatient Specialists: None Patient coming from: Home  Chief Complaint: Worsening shortness of breath and cough.  HPI: [name] is a 68 y.o. 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  Symptoms are worse with ambulation.  Denies any subjective fevers or chills.  Also endorses a painful rash similar to previous herpetic lesions in 2016, involving her right breast, vulva area, and surrounding skin.  States she lost her partner whom she lives with recently and has been under a lot of stress.  Was recently prescribed p.o. valacyclovir  to be taken from 05/24/2024 until 06/03/24 however she started taking the medication just 2 days ago.  Denies any previous herpes outbreak.  No reported chest pain.  In the ER, tachycardic and tachypneic with chest x-ray revealed left lower lobe infiltrates.  ProBNP is elevated greater than 1700.  Flat troponin 51, 50 without evidence of acute ischemia on twelve-lead EKG.  On exam herpetic lesions noted as stated above.    The patient received Rocephin  and azithromycin.  Due to concern for herpes outbreak, started valacyclovir  500 mg daily x 10 days, renally dosed.  ED Course: Temperature 98.  BP 117/81, pulse 100, respiratory rate 24.  O2 saturation at 100% on room air.  Lab studies notable for WBC 19.8, hemoglobin 7.9, MCV 105.1, platelet count 357.  Neutrophil count 15.4.  Serum potassium 3.4, serum bicarb 9, anion gap 17.Alkaline phosphatase 124.  BUN 56, creatinine 5.0.  proBNP 1769.  Lactic acid 0.9.  Review of Systems: Review of systems as noted in the HPI. All other  systems reviewed and are negative.   Past Medical History:  Diagnosis Date   Abdominal pain    Hx   Acute pyelonephritis 12/07/2020   Allergy    Anal stenosis    Anemia    Anxiety    Arthritis    Asthma    patient does not have inhaler   Blood in stool    Hx   Blood in urine    Hx   Blood transfusion without reported diagnosis    Cataract    CKD (chronic kidney disease) stage 3, GFR 30-59 ml/min (HCC)    Crohn's colitis, other complication (HCC)    De Quervain's tenosynovitis    Depression    Difficulty urinating    Hx   Easy bruising    Esophagitis    Fainting    History - resolved - due to dehydration   Fatigue    Hx   Fibroid    Hx   Gastric polyp    GERD (gastroesophageal reflux disease)    Hearing loss    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   Hemorrhoids, external    Hemorrhoids, internal    Herpes, genital    vaginal treated 07/05/12 and pt states is resolved   History of cervical dysplasia    History of small bowel obstruction    Hyperlipidemia    currently no meds   Hyperparathyroidism    Hypertension    Hypokalemia    Hx   Hypotension 06/29/2022   IBD (inflammatory bowel disease)    initially  colectomy for suspected UC, 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   Obesity    Ovarian cyst    Pain in joint, pelvic region and thigh 12/16/2008   Centricity Description: HIP PAIN, LEFT  Qualifier: Diagnosis of   By: Antonio ROSALEA Rockers      Centricity Description: HIP PAIN  Qualifier: Diagnosis of   By: Antonio ROSALEA Rockers       Pain of right thumb 04/12/2018   Poor dental hygiene    Pulmonary nodule, right    right upper lobe   Rectal bleeding    Hx   Rectal pain    Hx   Renal insufficiency    CKD - stage 3   RLS (restless legs syndrome)    no meds   Sepsis secondary to UTI (HCC) 06/29/2022   Severe sepsis (HCC) 12/07/2020   Tooth infection 11/2016   right low   Ulcerative colitis    Visual  disturbance    wears glasses   Weakness generalized    Hx - patient denies generalized weakness   Wears dentures    upper only   Past Surgical History:  Procedure Laterality Date   ANAL DILATION     BIOPSY  07/20/2022   Procedure: BIOPSY;  Surgeon: Aneita Gwendlyn DASEN, MD;  Location: [facility];  Service: Gastroenterology;;   CERVICAL BIOPSY  W/ LOOP ELECTRODE EXCISION     CHOLECYSTECTOMY     COLONOSCOPY     [name]   fatty tumor removed from back     X 2   HEMORRHOID SURGERY     ILEOSCOPY N/A 07/20/2022   Procedure: ILEOSCOPY THROUGH STOMA;  Surgeon: Aneita Gwendlyn DASEN, MD;  Location: [facility];  Service: Gastroenterology;  Laterality: N/A;   ILEOSTOMY CLOSURE     IR CV LINE INJECTION  05/30/2023   IR CV LINE INJECTION  08/02/2023   IR CV LINE INJECTION  12/05/2023   IR IMAGING GUIDED PORT INSERTION  04/12/2023   IR IMAGING GUIDED PORT INSERTION  12/22/2023   IR REMOVAL TUN ACCESS W/ PORT W/O FL MOD SED  12/22/2023   RESTORATIVE PROCTOCOLECTOMY     with insertion of ileoanal J Pouch with loop ileostomy   SHOULDER ARTHROSCOPY WITH CAPSULORRHAPHY Left 06/14/2019   Procedure: LEFT SHOULDER ARTHRSCOPIC REPAIR OF BONY BANKART FRACTURE;  Surgeon: Dozier Soulier, MD;  Location: [facility];  Service: Orthopedics;  Laterality: Left;  REQUEST 90 MINUTE   SIGMOIDOSCOPY     TOTAL ABDOMINAL HYSTERECTOMY  1998   TAH/LSO   TUBAL LIGATION     UPPER GASTROINTESTINAL ENDOSCOPY     [name]    Social History:  reports that she quit smoking about 45 years ago. Her smoking use included cigarettes. She started smoking about 49 years ago. She has a 4 pack-year smoking history. She has never used smokeless tobacco. She reports that she does not drink alcohol and does not use drugs.   Allergies  Allergen Reactions   Sulfa Antibiotics Hives, Itching, Rash and Swelling   Morphine Other (See Comments)    Gives me crazy dreams (delusions, also) Psychosis, also    Sulfonamide Derivatives Hives, Itching and  Swelling    Family History  Problem Relation Age of Onset   Hypertension Mother    Heart disease Mother        s/p pci   Ulcerative colitis Father    Hypertension Father    Heart attack Father    Diabetes  Sister    Cancer Sister        uterine   Pulmonary fibrosis Sister    Cancer Maternal Uncle        LUNG   Ulcerative colitis Daughter    Liver disease Daughter    Irritable bowel syndrome Other        grandchildren   Colon cancer Neg Hx    Esophageal cancer Neg Hx    Stomach cancer Neg Hx    Rectal cancer Neg Hx       Prior to Admission medications   Medication Sig Start Date End Date Taking? Authorizing Provider  acetaminophen  (TYLENOL ) 500 MG tablet Take 1,000 mg by mouth every 6 (six) hours as needed (for pain/sleep).    [provider]  allopurinol  (ZYLOPRIM ) 300 MG tablet TAKE 1/2 TABLET (150 mg total) BY MOUTH DAILY AT 9PM 12/05/23   Lowne Chase, Yvonne R, DO  amLODipine  (NORVASC ) 5 MG tablet Take 5 mg by mouth daily. 10/13/23   [provider]  apixaban  (ELIQUIS ) 5 MG TABS tablet Take 1 tablet (5 mg total) by mouth 2 (two) times daily. Patient not taking: Reported on 05/22/2024 02/03/23 05/03/24  Arlice Reichert, MD  Calcium  Carbonate Antacid (TUMS EXTRA STRENGTH 750 PO) Take 1-2 tablets by mouth every 6 (six) hours as needed (CHEW for heartburn).    [provider]  dicyclomine  (BENTYL ) 10 MG capsule Take 1 capsule (10 mg total) by mouth every 8 (eight) hours as needed for spasms. 10/25/23   Armbruster, Elspeth SQUIBB, MD  DULoxetine  (CYMBALTA ) 60 MG capsule TAKE ONE CAPSULE (60 MG TOTAL) BY MOUTH DAILY AT 9AM (PATIENT NEEDS FURTHER EVALUATION AND/OR LABORATORY TESTING BEFORE FURTHER REFILLS ARE GIVEN) 06/01/24   Antonio Cyndee Jamee JONELLE, DO  folic acid  (FOLVITE ) 1 MG tablet Take 2 tablets (2 mg total) by mouth daily. 05/26/17   Timmy Maude JONELLE, MD  GAS-X EXTRA STRENGTH 125 MG CAPS Take 125 mg by mouth at bedtime. Patient not taking: Reported on 05/22/2024     [provider]  metoprolol  succinate (TOPROL -XL) 100 MG 24 hr tablet Take 100 mg by mouth at bedtime.    [provider]  octreotide  (SANDOSTATIN  LAR DEPOT) 20 MG injection Inject 20 mg into the muscle every 28 (twenty-eight) days. 11/14/23   Armbruster, Elspeth SQUIBB, MD  OVER THE COUNTER MEDICATION Take 1 tablet by mouth See admin instructions. Nervive Nerve Health tablet- Take 1 tablet by mouth at bedtime    [provider]  Psyllium (METAMUCIL PREMIUM BLEND) 52.63 % POWD Take Daily 06/03/23   Armbruster, Elspeth SQUIBB, MD  Vitamin D , Ergocalciferol , (DRISDOL ) 1.25 MG (50000 UNIT) CAPS capsule Take 50,000 Units by mouth every 30 (thirty) days.    [provider]    Physical Exam: BP (!) 89/71   Pulse 99   Temp 98 F (36.7 C)   Resp 15   Ht 5' 3 (1.6 m)   Wt 78 kg   SpO2 100%   BMI 30.46 kg/m   General: 68 y.o. year-old female well developed well nourished in no acute distress.  Alert and oriented x3. Cardiovascular: Tachycardic with no rubs or gallops.  No thyromegaly or JVD noted.  Trace lower extremity edema bilaterally.   Respiratory: Faint rales at bases. Good inspiratory effort. Abdomen: Soft nontender nondistended with normal bowel sounds x4 quadrants. Muskuloskeletal: No cyanosis, clubbing or edema noted bilaterally Neuro: CN II-XII intact, strength, sensation, reflexes Skin: Herpetic lesions noted right under breast, vulva area and surrounding  skin. Psychiatry: Judgement and insight appear normal. Mood is appropriate for condition and setting          Labs on Admission:  Basic Metabolic Panel: Recent Labs  Lab 06/07/24 1853  NA 136  K 3.4*  CL 111  CO2 9*  GLUCOSE 118*  BUN 56*  CREATININE 5.00*  CALCIUM  7.4*   Liver Function Tests: Recent Labs  Lab 06/07/24 1853  AST 24  ALT 22  ALKPHOS 174*  BILITOT 0.4  PROT 7.6  ALBUMIN 3.8   No results for input(s): LIPASE, AMYLASE in the last 168 hours. No results for input(s):  AMMONIA in the last 168 hours. CBC: Recent Labs  Lab 06/07/24 1853  WBC 19.8*  NEUTROABS 15.4*  HGB 7.9*  HCT 26.7*  MCV 105.1*  PLT 357   Cardiac Enzymes: No results for input(s): CKTOTAL, CKMB, CKMBINDEX, TROPONINI in the last 168 hours.  BNP (last 3 results) No results for input(s): BNP in the last 8760 hours.  ProBNP (last 3 results) Recent Labs    06/07/24 1853  PROBNP 1,769.0*    CBG: No results for input(s): GLUCAP in the last 168 hours.  Radiological Exams on Admission: DG Chest Port 1 View Result Date: 06/07/2024 CLINICAL DATA:  Questionable sepsis - evaluate for abnormality EXAM: PORTABLE CHEST 1 VIEW COMPARISON:  Chest x-ray 09/13/2023, CT chest 02/15/2023 FINDINGS: Right chest wall Port-A-Cath with tip at the superior cavoatrial junction. The heart and mediastinal contours are within normal limits. Question left base airspace opacity. No pulmonary edema. No pleural effusion. No pneumothorax. No acute osseous abnormality. IMPRESSION: Question left base airspace opacity. Recommend repeat chest x-ray PA and lateral view for further evaluation. Electronically Signed   By: Morgane  Naveau M.D.   On: 06/07/2024 19:22    EKG: I independently viewed the EKG done and my findings are as followed:    Assessment/Plan Present on Admission:  CAP (community acquired pneumonia)  Principal Problem:   CAP (community acquired pneumonia)  Left lower lobe community-acquired pneumonia, POA Continue Rocephin  and azithromycin As needed antitussives and bronchodilators Early mobilization Monitor fever curve and WBC Follow CBC and baseline procalcitonin in the morning.  Elevated proBNP and mild volume overload proBNP on admission 1769. Follow-up 2D echo Monitor strict I's and O's and daily weight.  Elevated troponin, suspect demand ischemia in the setting of likely acute CHF Troponin flat 51, 50. No evidence of acute ischemia on twelve-lead EKG. Follow 2D  echo Monitor on telemetry  Chronic herpes simplex with herpes outbreak Contact precautions, gloves, gown, hand hygiene Patient states this is her first herpes outbreak. P.o. valacyclovir  500 mg daily x 10 days, renally dosed.  Chron's disease with right lower quadrant ileostomy No acute issues  CKD 5 No prior history of hemodialysis Renally dose medications. Avoid nephrotoxic agents and hypotension. Monitor urine output Repeat BMP in the morning.    Critical care time: 55 minutes.    DVT prophylaxis: Subcu heparin  3 times daily  Code Status: Full code  Family Communication: None at bedside.  Disposition Plan: Admit to telemetry unit.  Consults called: None.  Admission status: Inpatient status.   Status is: Inpatient The patient requires at least 2 midnights for further evaluation and treatment of present condition.   Terry LOISE Hurst MD [facility] Pager [phone]  If 7PM-7AM, please contact night-coverage [email or web address] Password [email or web address]  06/07/2024, 10:22 PM

## 2024-06-08 ENCOUNTER — Inpatient Hospital Stay (HOSPITAL_COMMUNITY)

## 2024-06-08 ENCOUNTER — Encounter (HOSPITAL_COMMUNITY): Payer: Self-pay | Admitting: Internal Medicine

## 2024-06-08 ENCOUNTER — Ambulatory Visit

## 2024-06-08 DIAGNOSIS — R0609 Other forms of dyspnea: Secondary | ICD-10-CM | POA: Diagnosis not present

## 2024-06-08 DIAGNOSIS — R7989 Other specified abnormal findings of blood chemistry: Secondary | ICD-10-CM | POA: Diagnosis not present

## 2024-06-08 LAB — URINALYSIS, W/ REFLEX TO CULTURE (INFECTION SUSPECTED)
Bilirubin Urine: NEGATIVE
Glucose, UA: 50 mg/dL — AB
Ketones, ur: NEGATIVE mg/dL
Nitrite: NEGATIVE
Protein, ur: 100 mg/dL — AB
Specific Gravity, Urine: 1.009 (ref 1.005–1.030)
WBC, UA: >50 WBC/hpf (ref 0–5)
pH: 5 (ref 5.0–8.0)

## 2024-06-08 LAB — FERRITIN: Ferritin: 291 ng/mL (ref 11–307)

## 2024-06-08 LAB — MAGNESIUM
Magnesium: 0.9 mg/dL — CL (ref 1.7–2.4)
Magnesium: 2.5 mg/dL — ABNORMAL HIGH (ref 1.7–2.4)

## 2024-06-08 LAB — ECHOCARDIOGRAM COMPLETE
AR max vel: 2.62 cm2
AV Area VTI: 2.62 cm2
AV Area mean vel: 2.63 cm2
AV Mean grad: 7 mmHg
AV Peak grad: 12.5 mmHg
Ao pk vel: 1.77 m/s
Area-P 1/2: 9.03 cm2
Calc EF: 67.2 %
Height: 63 in
MV M vel: 5.62 m/s
MV Peak grad: 126.3 mmHg
MV VTI: 3.05 cm2
P 1/2 time: 357 ms
S' Lateral: 2.5 cm
Single Plane A2C EF: 68.9 %
Single Plane A4C EF: 65.3 %
Weight: 2736 [oz_av]

## 2024-06-08 LAB — HIV ANTIBODY (ROUTINE TESTING W REFLEX): HIV Screen 4th Generation wRfx: NONREACTIVE

## 2024-06-08 LAB — BASIC METABOLIC PANEL WITH GFR
Anion gap: 15 (ref 5–15)
BUN: 51 mg/dL — ABNORMAL HIGH (ref 8–23)
BUN: 51 mg/dL — ABNORMAL HIGH (ref 8–23)
CO2: <7 mmol/L — ABNORMAL LOW (ref 22–32)
CO2: 10 mmol/L — ABNORMAL LOW (ref 22–32)
Calcium: 6.7 mg/dL — ABNORMAL LOW (ref 8.9–10.3)
Calcium: 8.3 mg/dL — ABNORMAL LOW (ref 8.9–10.3)
Chloride: 118 mmol/L — ABNORMAL HIGH (ref 98–111)
Chloride: 112 mmol/L — ABNORMAL HIGH (ref 98–111)
Creatinine, Ser: 4.57 mg/dL — ABNORMAL HIGH (ref 0.44–1.00)
Creatinine, Ser: 4.63 mg/dL — ABNORMAL HIGH (ref 0.44–1.00)
GFR, Estimated: 10 mL/min — ABNORMAL LOW (ref >60)
GFR, Estimated: 10 mL/min — ABNORMAL LOW (ref >60)
Glucose, Bld: 119 mg/dL — ABNORMAL HIGH (ref 70–99)
Glucose, Bld: 186 mg/dL — ABNORMAL HIGH (ref 70–99)
Potassium: 3.1 mmol/L — ABNORMAL LOW (ref 3.5–5.1)
Potassium: 3.5 mmol/L (ref 3.5–5.1)
Sodium: 140 mmol/L (ref 135–145)
Sodium: 137 mmol/L (ref 135–145)

## 2024-06-08 LAB — PROCALCITONIN: Procalcitonin: 1.08 ng/mL

## 2024-06-08 LAB — IRON AND TIBC
Iron: 20 ug/dL — ABNORMAL LOW (ref 28–170)
Saturation Ratios: 9 % — ABNORMAL LOW (ref 10.4–31.8)
TIBC: 232 ug/dL — ABNORMAL LOW (ref 250–450)
UIBC: 213 ug/dL

## 2024-06-08 LAB — CBC
HCT: 24.3 % — ABNORMAL LOW (ref 36.0–46.0)
Hemoglobin: 7.5 g/dL — ABNORMAL LOW (ref 12.0–15.0)
MCH: 32.2 pg (ref 26.0–34.0)
MCHC: 30.9 g/dL (ref 30.0–36.0)
MCV: 104.3 fL — ABNORMAL HIGH (ref 80.0–100.0)
Platelets: 317 K/uL (ref 150–400)
RBC: 2.33 MIL/uL — ABNORMAL LOW (ref 3.87–5.11)
RDW: 16.2 % — ABNORMAL HIGH (ref 11.5–15.5)
WBC: 16.1 K/uL — ABNORMAL HIGH (ref 4.0–10.5)
nRBC: 0 % (ref 0.0–0.2)

## 2024-06-08 LAB — PHOSPHORUS: Phosphorus: 5.2 mg/dL — ABNORMAL HIGH (ref 2.5–4.6)

## 2024-06-08 MED ORDER — CALCIUM GLUCONATE-NACL 2-0.675 GM/100ML-% IV SOLN
2.0000 g | Freq: Once | INTRAVENOUS | Status: AC
  Administered 2024-06-08: 2000 mg via INTRAVENOUS
  Filled 2024-06-08: qty 100

## 2024-06-08 MED ORDER — SODIUM CHLORIDE 0.9 % IV BOLUS
1000.0000 mL | Freq: Once | INTRAVENOUS | Status: DC
  Filled 2024-06-08: qty 1000

## 2024-06-08 MED ORDER — SODIUM BICARBONATE 8.4 % IV SOLN
INTRAVENOUS | Status: DC
  Filled 2024-06-08: qty 1000
  Filled 2024-06-08 (×3): qty 150
  Filled 2024-06-08: qty 1000
  Filled 2024-06-08: qty 150

## 2024-06-08 MED ORDER — INFLUENZA VAC SPLIT HIGH-DOSE 0.5 ML IM SUSY
0.5000 mL | PREFILLED_SYRINGE | INTRAMUSCULAR | Status: DC
  Filled 2024-06-08: qty 0.5

## 2024-06-08 MED ORDER — HEPARIN SOD (PORK) LOCK FLUSH 100 UNIT/ML IV SOLN: 500.0000 [IU] | Freq: Once | INTRAVENOUS | Status: DC | PRN

## 2024-06-08 MED ORDER — POTASSIUM CHLORIDE CRYS ER 20 MEQ PO TBCR
40.0000 meq | EXTENDED_RELEASE_TABLET | ORAL | Status: AC
  Administered 2024-06-08 (×2): 40 meq via ORAL
  Filled 2024-06-08 (×2): qty 2

## 2024-06-08 MED ORDER — MAGNESIUM SULFATE 4 GM/100ML IV SOLN
4.0000 g | Freq: Once | INTRAVENOUS | Status: AC
  Administered 2024-06-08: 4 g via INTRAVENOUS
  Filled 2024-06-08: qty 100

## 2024-06-08 NOTE — Progress Notes (Signed)
 PROGRESS NOTE    [name]  FMW:[id number] DOB: [birth year] DOA: 06/07/2024 PCP: Antonio Cyndee Jamee JONELLE, DO   Brief Narrative:  68 y.o. 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  On presentation, she was tachycardic, tachypneic with chest x-ray showing left lower lobe infiltrates and proBNP of more than 1700 with troponin of 51 and 50 without any evidence of acute ischemia on EKG.  She was started on Rocephin  and Zithromax along with Valtrex .  WBCs of 19.8, hemoglobin of 7.9, bicarb of 19, creatinine of 5 and lactic acid of 0.9.  Assessment & Plan:   Possible left lower lobe community-acquired bacterial pneumonia: Present on admission; bacteria unspecified -COVID/RSV/influenza PCR negative.  Follow blood cultures.  Currently on room air.  Supplemental oxygen if needed.  Continue Rocephin  and Zithromax.  Procalcitonin 1.08 this morning.  Possible UTI - Present on admission.  Follow urine culture.  Continue Rocephin .  Leukocytosis -Improving.  Monitor  Elevated proBNP and mild volume overload Elevated troponin: Suspect demand ischemia - Unsure if this is setting of acute CHF.  Follow 2D echo.  Denies any chest pain.  Troponins did not trend upwards.  No evidence of acute ischemia on EKG.  Continue telemetry monitoring.  Strict input and output.  Daily weights.  CKD stage V - Creatinine on 04/13/2024 was 4.97.  Presented with creatinine of 5.  Creatinine 4.57 this morning.  Patient is open to hemodialysis if needed.  Follows-up with Dr. Rayburn as an outpatient.  States that she has been slightly out of it  recently.  Will ask nephrology evaluation - Renally dose medications chronic herpes simplex with herpes outbreak - Continue contact precautions.  Continue renally dosed Valtrex  for 10 days  Acute metabolic acidosis - Bicarb less than 7 today.  Start bicarb drip.  Monitor  Hypokalemia - Replace.  Repeat a.m. labs  Hypomagnesemia - Replace.  Repeat a.m. labs  Crohn's disease with right lower quadrant ileostomy -No acute issues.  Outpatient follow-up with GI  Obesity class I - Outpatient follow-up  Anemia of chronic disease Macrocytosis - From chronic illnesses.  Hemoglobin stable.  Monitor intermittently.  No signs of bleeding.    DVT prophylaxis: Heparin  subcutaneous Code Status: Full Family Communication: None at bedside Disposition Plan: Status is: Inpatient Remains inpatient appropriate because: Of severity of illness    Consultants: Consult nephrology  Procedures: None  Antimicrobials:  Anti-infectives (From admission, onward)    Start     Dose/Rate Route Frequency Ordered Stop   06/08/24 2200  cefTRIAXone  (ROCEPHIN ) 1 g in sodium chloride  0.9 % 100 mL IVPB        1 g 200 mL/hr over 30 Minutes Intravenous Every 24 hours 06/07/24 2223     06/08/24 1000  azithromycin (ZITHROMAX) tablet 500 mg        500 mg Oral Daily 06/07/24 2223     06/08/24 1000  valACYclovir  (VALTREX ) tablet 500 mg        500 mg Oral Daily 06/07/24 2307 06/17/24 0959   06/07/24 2245  valACYclovir  (VALTREX ) tablet 500 mg  Status:  Discontinued        500 mg Oral Daily 06/07/24 2232 06/07/24 2307  06/07/24 2115  cefTRIAXone  (ROCEPHIN ) 1 g in sodium chloride  0.9 % 100 mL IVPB        1 g 200 mL/hr over 30 Minutes Intravenous  Once 06/07/24 2107 06/07/24 2255   06/07/24 2115  azithromycin (ZITHROMAX) tablet 500 mg        500 mg Oral  Once 06/07/24 2108 06/07/24 2203        Subjective: Patient seen and examined at bedside.  Feels slightly better.  Still short of breath with exertion.   Denies any chest pain.  No fever, agitation or vomiting reported.  Objective: Vitals:   06/08/24 0319 06/08/24 0615 06/08/24 0719 06/08/24 0808  BP:  139/71  (!) 140/83  Pulse:  (!) 105  (!) 119  Resp:  16  17  Temp: 98.3 F (36.8 C)   98 F (36.7 C)  TempSrc: Oral   Oral  SpO2:  99%  99%  Weight:   77.6 kg   Height:   5' 3 (1.6 m)    No intake or output data in the 24 hours ending 06/08/24 1051 Filed Weights   06/07/24 1835 06/08/24 0719  Weight: 78 kg 77.6 kg    Examination:  General exam: Appears calm and comfortable.  Chronically ill and deconditioned looking. ENT: No thyromegaly noted  respiratory system: Bilateral decreased breath sounds at bases with scattered crackles Cardiovascular system: S1 & S2 heard, intermittent tachycardia present gastrointestinal system: Abdomen is nondistended, soft and nontender. Normal bowel sounds heard.  Ileostomy bag present. Extremities: No cyanosis, clubbing, edema  Central nervous system: Alert and oriented.  Slow to respond.  Poor historian.  No focal neurological deficits. Moving extremities Psychiatry: Flat affect.  Not agitated.  Data Reviewed: I have personally reviewed following labs and imaging studies  CBC: Recent Labs  Lab 06/07/24 1853 06/08/24 0527  WBC 19.8* 16.1*  NEUTROABS 15.4*  --   HGB 7.9* 7.5*  HCT 26.7* 24.3*  MCV 105.1* 104.3*  PLT 357 317   Basic Metabolic Panel: Recent Labs  Lab 06/07/24 1853 06/08/24 0527  NA 136 140  K 3.4* 3.1*  CL 111 118*  CO2 9* <7*  GLUCOSE 118* 119*  BUN 56* 51*  CREATININE 5.00* 4.57*  CALCIUM  7.4* 6.7*  MG  --  0.9*  PHOS  --  5.2*   GFR: Estimated Creatinine Clearance: 11.3 mL/min (A) (by C-G formula based on SCr of 4.57 mg/dL (H)). Liver Function Tests: Recent Labs  Lab 06/07/24 1853  AST 24  ALT 22  ALKPHOS 174*  BILITOT 0.4  PROT 7.6  ALBUMIN 3.8   No results for input(s): LIPASE, AMYLASE in the last 168 hours. No results for input(s):  AMMONIA in the last 168 hours. Coagulation Profile: Recent Labs  Lab 06/07/24 1853  INR 1.2   Cardiac Enzymes: No results for input(s): CKTOTAL, CKMB, CKMBINDEX, TROPONINI in the last 168 hours. BNP (last 3 results) Recent Labs    06/07/24 1853  PROBNP 1,769.0*   HbA1C: No results for input(s): HGBA1C in the last 72 hours. CBG: No results for input(s): GLUCAP in the last 168 hours. Lipid Profile: No results for input(s): CHOL, HDL, LDLCALC, TRIG, CHOLHDL, LDLDIRECT in the last 72 hours. Thyroid  Function Tests: No results for input(s): TSH, T4TOTAL, FREET4, T3FREE, THYROIDAB in the last 72 hours. Anemia Panel: No results for input(s): VITAMINB12, FOLATE, FERRITIN, TIBC, IRON , RETICCTPCT in the last 72 hours. Sepsis Labs: Recent Labs  Lab 06/07/24 1928 06/07/24 2146 06/08/24 0527  PROCALCITON  --   --  1.08  LATICACIDVEN 1.5 0.9  --     Recent Results (from the past 240 hours)  Resp panel by RT-PCR (RSV, Flu A&B, Covid) Anterior Nasal Swab     Status: None   Collection Time: 06/07/24  7:34 PM   Specimen: Anterior Nasal Swab  Result Value Ref Range Status   SARS Coronavirus 2 by RT PCR NEGATIVE NEGATIVE Final    Comment: (NOTE) SARS-CoV-2 target nucleic acids are NOT DETECTED.  The SARS-CoV-2 RNA is generally detectable in upper respiratory specimens during the acute phase of infection. The lowest concentration of SARS-CoV-2 viral copies this assay can detect is 138 copies/mL. A negative result does not preclude SARS-Cov-2 infection and should not be used as the sole basis for treatment or other patient management decisions. A negative result may occur with  improper specimen collection/handling, submission of specimen other than nasopharyngeal swab, presence of viral mutation(s) within the areas targeted by this assay, and inadequate number of viral copies(<138 copies/mL). A negative result must be combined  with clinical observations, patient history, and epidemiological information. The expected result is Negative.  Fact Sheet for Patients:  BloggerCourse.com  Fact Sheet for Healthcare Providers:  SeriousBroker.it  This test is no t yet approved or cleared by the [place]  FDA and  has been authorized for detection and/or diagnosis of SARS-CoV-2 by FDA under an Emergency Use Authorization (EUA). This EUA will remain  in effect (meaning this test can be used) for the duration of the COVID-19 declaration under Section 564(b)(1) of the Act, 21 U.S.C.section 360bbb-3(b)(1), unless the authorization is terminated  or revoked sooner.       Influenza A by PCR NEGATIVE NEGATIVE Final   Influenza B by PCR NEGATIVE NEGATIVE Final    Comment: (NOTE) The Xpert Xpress SARS-CoV-2/FLU/RSV plus assay is intended as an aid in the diagnosis of influenza from Nasopharyngeal swab specimens and should not be used as a sole basis for treatment. Nasal washings and aspirates are unacceptable for Xpert Xpress SARS-CoV-2/FLU/RSV testing.  Fact Sheet for Patients: BloggerCourse.com  Fact Sheet for Healthcare Providers: SeriousBroker.it  This test is not yet approved or cleared by the [place]  FDA and has been authorized for detection and/or diagnosis of SARS-CoV-2 by FDA under an Emergency Use Authorization (EUA). This EUA will remain in effect (meaning this test can be used) for the duration of the COVID-19 declaration under Section 564(b)(1) of the Act, 21 U.S.C. section 360bbb-3(b)(1), unless the authorization is terminated or revoked.     Resp Syncytial Virus by PCR NEGATIVE NEGATIVE Final    Comment: (NOTE) Fact Sheet for Patients: BloggerCourse.com  Fact Sheet for Healthcare Providers: SeriousBroker.it  This test is not yet approved  or cleared by the [place]  FDA and has been authorized for detection and/or diagnosis of SARS-CoV-2 by FDA under an Emergency Use Authorization (EUA). This EUA will remain in effect (meaning this test can be used) for the duration of the COVID-19 declaration under Section 564(b)(1) of the Act, 21 U.S.C. section 360bbb-3(b)(1), unless the authorization is terminated or revoked.  Performed at [facility], [address] [place], [place] [address]          Radiology Studies: DG Chest Port 1 View Result Date: 06/07/2024 CLINICAL DATA:  Questionable sepsis - evaluate for abnormality EXAM: PORTABLE CHEST 1 VIEW COMPARISON:  Chest x-ray 09/13/2023, CT chest 02/15/2023 FINDINGS: Right chest wall Port-A-Cath with tip at the superior cavoatrial junction. The heart and mediastinal contours are within normal limits.  Question left base airspace opacity. No pulmonary edema. No pleural effusion. No pneumothorax. No acute osseous abnormality. IMPRESSION: Question left base airspace opacity. Recommend repeat chest x-ray PA and lateral view for further evaluation. Electronically Signed   By: Morgane  Naveau M.D.   On: 06/07/2024 19:22        Scheduled Meds:  azithromycin  500 mg Oral Daily   heparin   5,000 Units Subcutaneous Q8H   potassium chloride   40 mEq Oral Q4H   valACYclovir   500 mg Oral Daily   Continuous Infusions:  cefTRIAXone  (ROCEPHIN )  IV     sodium bicarbonate  150 mEq in dextrose  5 % 1,150 mL infusion 100 mL/hr at 06/08/24 0842          Sophie Mao, MD [facility] 06/08/2024, 10:51 AM

## 2024-06-08 NOTE — ED Notes (Addendum)
 Pt needed a complete bed change and also used the bed side commode.

## 2024-06-08 NOTE — Plan of Care (Signed)

## 2024-06-08 NOTE — Consult Note (Signed)
 Renal Service Consult Note [facility] [name] Jeanette Fret, MD  Patient: [name] VEAR Levy Date: 06/08/2024 Requesting Physician: Dr. Cheryle  Reason for Consult: Renal failure HPI: The patient is a 68 y.o. year-old w/ PMH as below who presented to ED yesterday c/o gen'd weakness and AMS, also SOB w/ cough. In ED temp 98, BP 117/81, HR 100, RR 24. 100% on RA. Labs showed WBC 19k, Hb 7.9, mcv 105.  K+ 3.4, bicarb 9, AG 17, bun 56, creat 5.0.   LA 0.9. Pt was admitted and given IV abx for possible PNA. Question of CHF w/ high BNP, but CXR didn't show signs. Pt has hx of CKD w/ baseline creat 3.5- 4.0.  Creatinine here is 4.5- 5.0 range.  We are asked to see for renal failure.    Pt seen in room. She c/o confusion, severe fatigue, loss of appetite, worsening over the last few weeks or months. Denies leg swelling. Does have SOB w/ exertion. She lives w/ her mother, I believe that's what she said.    ROS - denies CP, no joint pain, no HA, no blurry vision, no rash, no diarrhea, no nausea/ vomiting   Past Medical History  Past Medical History:  Diagnosis Date   Abdominal pain    Hx   Acute pyelonephritis 12/07/2020   Allergy    Anal stenosis    Anemia    Anxiety    Arthritis    Asthma    patient does not have inhaler   Blood in stool    Hx   Blood in urine    Hx   Blood transfusion without reported diagnosis    Cataract    CKD (chronic kidney disease) stage 3, GFR 30-59 ml/min (HCC)    Crohn's colitis, other complication (HCC)    De Quervain's tenosynovitis    Depression    Difficulty urinating    Hx   Easy bruising    Esophagitis    Fainting    History - resolved - due to dehydration   Fatigue    Hx   Fibroid    Hx   Gastric polyp    GERD (gastroesophageal reflux disease)    Hearing loss    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   Hemorrhoids, external    Hemorrhoids, internal    Herpes, genital    vaginal treated 07/05/12 and pt states is resolved    History of cervical dysplasia    History of small bowel obstruction    Hyperlipidemia    currently no meds   Hyperparathyroidism    Hypertension    Hypokalemia    Hx   Hypotension 06/29/2022   IBD (inflammatory bowel disease)    initially colectomy for suspected UC, 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   Obesity    Ovarian cyst    Pain in joint, pelvic region and thigh 12/16/2008   Centricity Description: HIP PAIN, LEFT  Qualifier: Diagnosis of   By: Antonio ROSALEA Rockers      Centricity Description: HIP PAIN  Qualifier: Diagnosis of   By: Antonio ROSALEA Rockers       Pain of right thumb 04/12/2018   Poor dental hygiene    Pulmonary nodule, right    right upper lobe   Rectal bleeding    Hx   Rectal pain    Hx   Renal insufficiency    CKD - stage 3  RLS (restless legs syndrome)    no meds   Sepsis secondary to UTI (HCC) 06/29/2022   Severe sepsis (HCC) 12/07/2020   Tooth infection 11/2016   right low   Ulcerative colitis    Visual disturbance    wears glasses   Weakness generalized    Hx - patient denies generalized weakness   Wears dentures    upper only   Past Surgical History  Past Surgical History:  Procedure Laterality Date   ANAL DILATION     BIOPSY  07/20/2022   Procedure: BIOPSY;  Surgeon: Aneita Gwendlyn DASEN, MD;  Location: [facility];  Service: Gastroenterology;;   CERVICAL BIOPSY  W/ LOOP ELECTRODE EXCISION     CHOLECYSTECTOMY     COLONOSCOPY     [name]   fatty tumor removed from back     X 2   HEMORRHOID SURGERY     ILEOSCOPY N/A 07/20/2022   Procedure: ILEOSCOPY THROUGH STOMA;  Surgeon: Aneita Gwendlyn DASEN, MD;  Location: [facility];  Service: Gastroenterology;  Laterality: N/A;   ILEOSTOMY CLOSURE     IR CV LINE INJECTION  05/30/2023   IR CV LINE INJECTION  08/02/2023   IR CV LINE INJECTION  12/05/2023   IR IMAGING GUIDED PORT INSERTION  04/12/2023   IR IMAGING GUIDED PORT INSERTION  12/22/2023   IR REMOVAL  TUN ACCESS W/ PORT W/O FL MOD SED  12/22/2023   RESTORATIVE PROCTOCOLECTOMY     with insertion of ileoanal J Pouch with loop ileostomy   SHOULDER ARTHROSCOPY WITH CAPSULORRHAPHY Left 06/14/2019   Procedure: LEFT SHOULDER ARTHRSCOPIC REPAIR OF BONY BANKART FRACTURE;  Surgeon: Dozier Soulier, MD;  Location: [facility];  Service: Orthopedics;  Laterality: Left;  REQUEST 90 MINUTE   SIGMOIDOSCOPY     TOTAL ABDOMINAL HYSTERECTOMY  1998   TAH/LSO   TUBAL LIGATION     UPPER GASTROINTESTINAL ENDOSCOPY     [name]   Family History  Family History  Problem Relation Age of Onset   Hypertension Mother    Heart disease Mother        s/p pci   Ulcerative colitis Father    Hypertension Father    Heart attack Father    Diabetes Sister    Cancer Sister        uterine   Pulmonary fibrosis Sister    Cancer Maternal Uncle        LUNG   Ulcerative colitis Daughter    Liver disease Daughter    Irritable bowel syndrome Other        grandchildren   Colon cancer Neg Hx    Esophageal cancer Neg Hx    Stomach cancer Neg Hx    Rectal cancer Neg Hx    Social History  reports that she quit smoking about 45 years ago. Her smoking use included cigarettes. She started smoking about 49 years ago. She has a 4 pack-year smoking history. She has never used smokeless tobacco. She reports that she does not drink alcohol and does not use drugs. Allergies  Allergies  Allergen Reactions   Sulfa Antibiotics Hives, Itching, Rash and Swelling   Morphine Other (See Comments)    Gives me crazy dreams (delusions, also) Psychosis, also    Sulfonamide Derivatives Hives, Itching and Swelling   Home medications Prior to Admission medications   Medication Sig Start Date End Date Taking? Authorizing Provider  acetaminophen  (TYLENOL ) 500 MG tablet Take 1,000 mg by mouth every 6 (six) hours as needed (for pain/sleep).  Yes [provider]  allopurinol  (ZYLOPRIM ) 300 MG tablet TAKE 1/2 TABLET (150 mg total) BY MOUTH  DAILY AT 9PM 12/05/23  Yes Lowne Chase, Yvonne R, DO  calcitRIOL  (ROCALTROL ) 0.25 MCG capsule Take 0.25 mcg by mouth daily. 04/24/24  Yes [provider]  DULoxetine  (CYMBALTA ) 60 MG capsule TAKE ONE CAPSULE (60 MG TOTAL) BY MOUTH DAILY AT 9AM (PATIENT NEEDS FURTHER EVALUATION AND/OR LABORATORY TESTING BEFORE FURTHER REFILLS ARE GIVEN) 06/01/24  Yes Antonio Cyndee Jamee JONELLE, DO  metoprolol  succinate (TOPROL -XL) 100 MG 24 hr tablet Take 100 mg by mouth at bedtime.   Yes [provider]  octreotide  (SANDOSTATIN  LAR DEPOT) 20 MG injection Inject 20 mg into the muscle every 28 (twenty-eight) days. 11/14/23  Yes Armbruster, Elspeth SQUIBB, MD  Vitamin D , Ergocalciferol , (DRISDOL ) 1.25 MG (50000 UNIT) CAPS capsule Take 50,000 Units by mouth every 30 (thirty) days.   Yes [provider]  amLODipine  (NORVASC ) 5 MG tablet Take 5 mg by mouth daily. Patient not taking: Reported on 06/08/2024 10/13/23   [provider]  apixaban  (ELIQUIS ) 5 MG TABS tablet Take 1 tablet (5 mg total) by mouth 2 (two) times daily. Patient not taking: Reported on 05/22/2024 02/03/23 05/03/24  Arlice Reichert, MD  Calcium  Carbonate Antacid (TUMS EXTRA STRENGTH 750 PO) Take 1-2 tablets by mouth every 6 (six) hours as needed (CHEW for heartburn). Patient not taking: Reported on 06/08/2024    [provider]  dicyclomine  (BENTYL ) 10 MG capsule Take 1 capsule (10 mg total) by mouth every 8 (eight) hours as needed for spasms. Patient not taking: Reported on 06/08/2024 10/25/23   Jeanette Elspeth SQUIBB, MD  folic acid  (FOLVITE ) 1 MG tablet Take 2 tablets (2 mg total) by mouth daily. Patient not taking: Reported on 06/08/2024 05/26/17   Timmy Maude JONELLE, MD  GAS-X EXTRA STRENGTH 125 MG CAPS Take 125 mg by mouth at bedtime. Patient not taking: No sig reported    [provider]  OVER THE COUNTER MEDICATION Take 1 tablet by mouth See admin instructions. Nervive Nerve Health tablet- Take 1 tablet by mouth at  bedtime Patient not taking: Reported on 06/08/2024    [provider]  Psyllium (METAMUCIL PREMIUM BLEND) 52.63 % POWD Take Daily Patient not taking: Reported on 06/08/2024 06/03/23   Jeanette Elspeth SQUIBB, MD     Vitals:   06/08/24 0808 06/08/24 1133 06/08/24 1229 06/08/24 1525  BP: (!) 140/83 (!) 155/73 133/64 134/74  Pulse: (!) 119  (!) 103 (!) 102  Resp: 17 16 16 18   Temp: 98 F (36.7 C) 98.5 F (36.9 C) 98.2 F (36.8 C) 98 F (36.7 C)  TempSrc: Oral Tympanic Oral Oral  SpO2: 99% 100% 100% 99%  Weight:      Height:       Exam Gen alert, no distress Sclera anicteric, throat clear  No jvd or bruits Chest clear bilat to bases RRR no MRG Abd soft ntnd no mass or ascites +bs Ext no LE or UE edema, no other edema Neuro is alert, Ox 3 , nf, mild asterixis  Home bp meds: Toprol  xl 100 every day  Date  Creat  eGFR (ml/min) CO2 AG 2008- 2016 1.4- 2.5 2017- 2018 2.0- 2.6 2019- 2020 1.9- 2.4 2021  2.10- 4.94 2022- 2023 2.10- 6.61 2024  2.70- 4.00    10-24 8-22 Sep 2023 3.56  12 ml/min  20 Apr 2024 4.97  8 ml/min  13  06/07/24 5.00  9   9 17  06/08/24 4.57  10 ml/min  < 7 Not calc   Assessment/ Plan: CKD 5: w/ worsening anion gap and metabolic acidosis. Admitted for possible PNA, sepsis. She has advanced CKD 5 f/b Dr Rayburn at [facility] w/ numerous uremic symptoms including mostly severe fatigue, loss of appetite and more recently some confusion. Also DOE which is believe is related to her severe metabolic acidosis w/o renal compensation. Does not need UA or renal US . Recommend transfer to [facility]. Will consult [facility] tomorrow for access, she has seen them in the outpatient setting. Will cont IV bicarb at 100 cc/hr for symptomatic relief for now. I told her we will get HD started early next week after access procedures. Can do earlier if she worsens over the weekend. Will follow.  Metabolic acidosis: due to CKD 5, cont IV bicarb for now Anemia ckd: get fe/ tibc, ferritin.  Consider esa.         Myer Fret  MD [facility] 06/08/2024, 3:59 PM  Recent Labs  Lab 06/07/24 1853 06/08/24 0527  HGB 7.9* 7.5*  ALBUMIN 3.8  --   CALCIUM  7.4* 6.7*  PHOS  --  5.2*  CREATININE 5.00* 4.57*  K 3.4* 3.1*   Inpatient medications:  azithromycin  500 mg Oral Daily   heparin   5,000 Units Subcutaneous Q8H   [START ON 06/09/2024] Influenza vac split trivalent PF  0.5 mL Intramuscular Tomorrow-1000   valACYclovir   500 mg Oral Daily    cefTRIAXone  (ROCEPHIN )  IV     sodium bicarbonate  150 mEq in dextrose  5 % 1,150 mL infusion 100 mL/hr at 06/08/24 0842   acetaminophen , guaiFENesin-dextromethorphan, ipratropium-albuterol , melatonin, oxyCODONE , polyethylene glycol, prochlorperazine 

## 2024-06-08 NOTE — Progress Notes (Signed)
 Report called to receiving unit [id number] at [facility].

## 2024-06-09 ENCOUNTER — Encounter (HOSPITAL_COMMUNITY): Payer: Self-pay | Admitting: Internal Medicine

## 2024-06-09 DIAGNOSIS — A6004 Herpesviral vulvovaginitis: Secondary | ICD-10-CM

## 2024-06-09 DIAGNOSIS — J189 Pneumonia, unspecified organism: Secondary | ICD-10-CM | POA: Diagnosis not present

## 2024-06-09 DIAGNOSIS — E872 Acidosis, unspecified: Secondary | ICD-10-CM | POA: Diagnosis not present

## 2024-06-09 DIAGNOSIS — N185 Chronic kidney disease, stage 5: Secondary | ICD-10-CM | POA: Diagnosis not present

## 2024-06-09 DIAGNOSIS — N189 Chronic kidney disease, unspecified: Secondary | ICD-10-CM

## 2024-06-09 LAB — CBC WITH DIFFERENTIAL/PLATELET
Abs Immature Granulocytes: 0.09 K/uL — ABNORMAL HIGH (ref 0.00–0.07)
Basophils Absolute: 0.1 K/uL (ref 0.0–0.1)
Basophils Relative: 0 %
Eosinophils Absolute: 0.5 K/uL (ref 0.0–0.5)
Eosinophils Relative: 3 %
HCT: 20.9 % — ABNORMAL LOW (ref 36.0–46.0)
Hemoglobin: 7 g/dL — ABNORMAL LOW (ref 12.0–15.0)
Immature Granulocytes: 1 %
Lymphocytes Relative: 11 %
Lymphs Abs: 1.5 K/uL (ref 0.7–4.0)
MCH: 32.9 pg (ref 26.0–34.0)
MCHC: 33.5 g/dL (ref 30.0–36.0)
MCV: 98.1 fL (ref 80.0–100.0)
Monocytes Absolute: 1 K/uL (ref 0.1–1.0)
Monocytes Relative: 7 %
Neutro Abs: 10.5 K/uL — ABNORMAL HIGH (ref 1.7–7.7)
Neutrophils Relative %: 78 %
Platelets: 319 K/uL (ref 150–400)
RBC: 2.13 MIL/uL — ABNORMAL LOW (ref 3.87–5.11)
RDW: 15.9 % — ABNORMAL HIGH (ref 11.5–15.5)
WBC: 13.5 K/uL — ABNORMAL HIGH (ref 4.0–10.5)
nRBC: 0 % (ref 0.0–0.2)

## 2024-06-09 LAB — COMPREHENSIVE METABOLIC PANEL WITH GFR
ALT: 22 U/L (ref 0–44)
AST: 25 U/L (ref 15–41)
Albumin: 2.5 g/dL — ABNORMAL LOW (ref 3.5–5.0)
Alkaline Phosphatase: 122 U/L (ref 38–126)
Anion gap: 14 (ref 5–15)
BUN: 41 mg/dL — ABNORMAL HIGH (ref 8–23)
CO2: 15 mmol/L — ABNORMAL LOW (ref 22–32)
Calcium: 8 mg/dL — ABNORMAL LOW (ref 8.9–10.3)
Chloride: 109 mmol/L (ref 98–111)
Creatinine, Ser: 4.53 mg/dL — ABNORMAL HIGH (ref 0.44–1.00)
GFR, Estimated: 10 mL/min — ABNORMAL LOW (ref >60)
Glucose, Bld: 175 mg/dL — ABNORMAL HIGH (ref 70–99)
Potassium: 2.4 mmol/L — CL (ref 3.5–5.1)
Sodium: 138 mmol/L (ref 135–145)
Total Bilirubin: 0.7 mg/dL (ref 0.0–1.2)
Total Protein: 6.7 g/dL (ref 6.5–8.1)

## 2024-06-09 LAB — TSH: TSH: 0.965 u[IU]/mL (ref 0.350–4.500)

## 2024-06-09 LAB — AMMONIA: Ammonia: 21 umol/L (ref 9–35)

## 2024-06-09 LAB — URINE CULTURE: Culture: <10000 — AB

## 2024-06-09 LAB — VITAMIN B12: Vitamin B-12: 649 pg/mL (ref 180–914)

## 2024-06-09 LAB — PREPARE RBC (CROSSMATCH)

## 2024-06-09 LAB — MAGNESIUM: Magnesium: 1.7 mg/dL (ref 1.7–2.4)

## 2024-06-09 LAB — FOLATE: Folate: >20 ng/mL (ref >5.9)

## 2024-06-09 MED ORDER — SODIUM CHLORIDE 0.9 % IV SOLN
200.0000 mg | Freq: Every day | INTRAVENOUS | Status: AC
  Administered 2024-06-09 – 2024-06-13 (×5): 200 mg via INTRAVENOUS
  Filled 2024-06-09 (×5): qty 10

## 2024-06-09 MED ORDER — FLUCONAZOLE 100 MG PO TABS
200.0000 mg | ORAL_TABLET | Freq: Once | ORAL | Status: AC
  Administered 2024-06-09: 200 mg via ORAL
  Filled 2024-06-09: qty 2

## 2024-06-09 MED ORDER — CHLORHEXIDINE GLUCONATE CLOTH 2 % EX PADS
6.0000 | MEDICATED_PAD | Freq: Every day | CUTANEOUS | Status: DC
  Administered 2024-06-09 – 2024-06-10 (×2): 6 via TOPICAL

## 2024-06-09 MED ORDER — DULOXETINE HCL 60 MG PO CPEP
60.0000 mg | ORAL_CAPSULE | Freq: Every day | ORAL | Status: DC
  Administered 2024-06-09 – 2024-06-14 (×6): 60 mg via ORAL
  Filled 2024-06-09 (×6): qty 1

## 2024-06-09 MED ORDER — SODIUM CHLORIDE 0.9% FLUSH: 10.0000 mL | INTRAVENOUS | Status: DC | PRN

## 2024-06-09 MED ORDER — DARBEPOETIN ALFA 60 MCG/0.3ML IJ SOSY
60.0000 ug | PREFILLED_SYRINGE | INTRAMUSCULAR | Status: DC
  Administered 2024-06-09: 60 ug via SUBCUTANEOUS
  Filled 2024-06-09: qty 0.3

## 2024-06-09 MED ORDER — METOPROLOL SUCCINATE ER 100 MG PO TB24
100.0000 mg | ORAL_TABLET | Freq: Every day | ORAL | Status: DC
  Administered 2024-06-09 – 2024-06-13 (×5): 100 mg via ORAL
  Filled 2024-06-09 (×5): qty 1

## 2024-06-09 MED ORDER — CALCITRIOL 0.25 MCG PO CAPS
0.2500 ug | ORAL_CAPSULE | Freq: Every day | ORAL | Status: DC
  Administered 2024-06-09 – 2024-06-14 (×6): 0.25 ug via ORAL
  Filled 2024-06-09 (×6): qty 1

## 2024-06-09 MED ORDER — SODIUM CHLORIDE 0.9% IV SOLUTION: Freq: Once | INTRAVENOUS | Status: DC

## 2024-06-09 NOTE — Consult Note (Addendum)
 WOC Nurse Consult Note: Reason for Consult: lesions to L hip, genitals and R breast  Wound type: L hip appears rash and dry, not a wound  L labia/groin full thickness ? Etiology unknown if skin condition (history of genital herpes per chart review, treated with Valtrex  05/2024) or other  R breast lesion noted with associated intertriginous dermatitis underneath breast  ICD-10 CM Codes for Irritant Dermatitis  L30.4  - Erythema intertrigo. Also used for abrasion of the hand, chafing of the skin, dermatitis due to sweating and friction, friction dermatitis, friction eczema, and genital/thigh intertrigo.  Pressure Injury POA: NA not pressure related  Measurement: see nursing flowsheet  Wound bed:  L labia 2 red moist areas with dark red background; R breast lesion with redness noted  Widespread erythema underneath R breast  Drainage (amount, consistency, odor)  see nursing flowsheet  Periwound: Dressing procedure/placement/frequency:  Cleanse L groin/labia with soap and water , dry and apply Xeroform gauze ([place] [phone]) to wound bed daily and secure with ABD pad/peripad and mesh underwear or tape whichever patient can tolerate.  Cleanse R breast lesion with NS, apply a small piece of silver hydrofiber Soila [phone]) to lesion bed daily then apply Interdry AG underneath breast as follows: Order Gerlean # [phone] Measure and cut length of InterDry to fit in skin folds that have skin breakdown Tuck InterDry fabric into skin folds in a single layer, allow for 2 inches of overhang from skin edges to allow for wicking to occur May remove to bathe; dry area thoroughly and then tuck into affected areas again Do not apply any creams or ointments when using InterDry DO NOT THROW AWAY FOR 5 DAYS unless soiled with stool DO NOT [facility] product, this will inactivate the silver in the material  New sheet of Interdry should be applied after 5 days of use if patient continues to have skin breakdown    WOC team will  not follow. Re-consult if further needs arise.   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  [place] IV infusions due to issues with dehydration ongoing.  WOC team has not personally seen this ileostomy; will place orders for a pouching system until told by bedside what patient uses (reports some supplies at bedside) or seen in person.   Convex 2 1/4 skin barrier Gerlean #[id number], 2 1/4 pouch Gerlean #[id number] or 2 1/4 high output pouch Gerlean [phone] which can be attached to foley bag IF stool is liquid and 2 barrier ring to go around stoma.    Thank you,    Powell Bar MSN, RN-BC, [employer]

## 2024-06-09 NOTE — Progress Notes (Signed)
 9071: Critical Lab report: K+= 2.4  1024: Nephrologist bedside. One daughter bedside and another daughter on phone 1240: Secretary ordering wound care supplies   1445: This nurse to patient's bedside as her chair alarm is alarming, patient seen getting out of recliner and walking to restroom. Patient states he IV is big. Upon assessment patient RFA (infusing with Sodium Bicarb in Dex 5 has infiltrated. Infusion stopped immediately and disconnected from patient. [facility] contacted at 1448 for infiltration precautions, states they will return call with instructions.  1450: D, RN Museum/gallery curator) to bedside states patient experienced 15 Run of Vtach per central telemetry 1455: Per pharmacy, remove IV, apply heat and elevate arm  1520: All Gerlean numbers given to secretary for wound care, patient states ostomy supplies are bedside and she doesn't forsee this needing to be changed at this time.  1550: Dr. Cherlyn notified about patient's HR and runs of Vtach  1552: Blood Bank called about unit of blood, states they have no received type and screen 1559: Dr. Cherlyn notified about infiltrated site.  1559: Elevation sling ordered 1610: Ortho tech bedside placing sling 1641: Per Dr. Cherlyn OK to place purwick in concern of vaginal wounds  1830: Wound care complete of patient, L labia has two lesions. Entire L labia is hard and not stationary. Dr. Cherlyn notified 1900: Bedside report given to Luke, RN.  Patient has one unit of blood running, purwick in place, IV consult placed as patient is difficult stick and wanted AC IVS removed

## 2024-06-09 NOTE — Consult Note (Signed)
 Hospital Consult    Reason for Consult: HD access creation  MRN #:  [id number]  History of Present Illness: This is a 68 y.o. female with CKD stage V who is now admitted with uremia.  She denies any previous access creation's.  She denies any upper extremity surgeries or significant injuries.  She is left-handed.  She was previously seen by Dr. Lanis in March and they determined that her best access would be a right sided AV graft.  At that time she was CKD stage IV and plan was to hold on graft until her kidney function worsened.  Past Medical History:  Diagnosis Date   Abdominal pain    Hx   Acute pyelonephritis 12/07/2020   Allergy    Anal stenosis    Anemia    Anxiety    Arthritis    Asthma    patient does not have inhaler   Blood in stool    Hx   Blood in urine    Hx   Blood transfusion without reported diagnosis    Cataract    CKD (chronic kidney disease) stage 3, GFR 30-59 ml/min (HCC)    Crohn's colitis, other complication (HCC)    De Quervain's tenosynovitis    Depression    Difficulty urinating    Hx   Easy bruising    Esophagitis    Fainting    History - resolved - due to dehydration   Fatigue    Hx   Fibroid    Hx   Gastric polyp    GERD (gastroesophageal reflux disease)    Hearing loss    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   Hemorrhoids, external    Hemorrhoids, internal    Herpes, genital    vaginal treated 07/05/12 and pt states is resolved   History of cervical dysplasia    History of small bowel obstruction    Hyperlipidemia    currently no meds   Hyperparathyroidism    Hypertension    Hypokalemia    Hx   Hypotension 06/29/2022   IBD (inflammatory bowel disease)    initially colectomy for suspected UC, 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   Obesity    Ovarian cyst    Pain in joint, pelvic region and thigh 12/16/2008   Centricity Description: HIP PAIN, LEFT  Qualifier: Diagnosis of    By: Antonio ROSALEA Rockers      Centricity Description: HIP PAIN  Qualifier: Diagnosis of   By: Antonio ROSALEA Rockers       Pain of right thumb 04/12/2018   Poor dental hygiene    Pulmonary nodule, right    right upper lobe   Rectal bleeding    Hx   Rectal pain    Hx   Renal insufficiency    CKD - stage 3   RLS (restless legs syndrome)    no meds   Sepsis secondary to UTI (HCC) 06/29/2022   Severe sepsis (HCC) 12/07/2020   Tooth infection 11/2016   right low   Ulcerative colitis    Visual disturbance    wears glasses   Weakness generalized    Hx - patient denies generalized weakness   Wears dentures    upper only    Past Surgical History:  Procedure Laterality Date   ANAL DILATION     BIOPSY  07/20/2022   Procedure: BIOPSY;  Surgeon: Aneita Gwendlyn DASEN, MD;  Location: [facility];  Service: Gastroenterology;;   CERVICAL BIOPSY  W/ LOOP ELECTRODE EXCISION     CHOLECYSTECTOMY     COLONOSCOPY     [name]   fatty tumor removed from back     X 2   HEMORRHOID SURGERY     ILEOSCOPY N/A 07/20/2022   Procedure: ILEOSCOPY THROUGH STOMA;  Surgeon: Aneita Gwendlyn DASEN, MD;  Location: [facility];  Service: Gastroenterology;  Laterality: N/A;   ILEOSTOMY CLOSURE     IR CV LINE INJECTION  05/30/2023   IR CV LINE INJECTION  08/02/2023   IR CV LINE INJECTION  12/05/2023   IR IMAGING GUIDED PORT INSERTION  04/12/2023   IR IMAGING GUIDED PORT INSERTION  12/22/2023   IR REMOVAL TUN ACCESS W/ PORT W/O FL MOD SED  12/22/2023   RESTORATIVE PROCTOCOLECTOMY     with insertion of ileoanal J Pouch with loop ileostomy   SHOULDER ARTHROSCOPY WITH CAPSULORRHAPHY Left 06/14/2019   Procedure: LEFT SHOULDER ARTHRSCOPIC REPAIR OF BONY BANKART FRACTURE;  Surgeon: Dozier Soulier, MD;  Location: [facility];  Service: Orthopedics;  Laterality: Left;  REQUEST 90 MINUTE   SIGMOIDOSCOPY     TOTAL ABDOMINAL HYSTERECTOMY  1998   TAH/LSO   TUBAL LIGATION     UPPER GASTROINTESTINAL ENDOSCOPY     [name]    Allergies   Allergen Reactions   Sulfa Antibiotics Hives, Itching, Rash and Swelling   Morphine Other (See Comments)    Gives me crazy dreams (delusions, also) Psychosis, also    Sulfonamide Derivatives Hives, Itching and Swelling    Prior to Admission medications   Medication Sig Start Date End Date Taking? Authorizing Provider  acetaminophen  (TYLENOL ) 500 MG tablet Take 1,000 mg by mouth every 6 (six) hours as needed (for pain/sleep).   Yes [provider]  allopurinol  (ZYLOPRIM ) 300 MG tablet TAKE 1/2 TABLET (150 mg total) BY MOUTH DAILY AT 9PM 12/05/23  Yes Lowne Chase, Yvonne R, DO  calcitRIOL  (ROCALTROL ) 0.25 MCG capsule Take 0.25 mcg by mouth daily. 04/24/24  Yes [provider]  DULoxetine  (CYMBALTA ) 60 MG capsule TAKE ONE CAPSULE (60 MG TOTAL) BY MOUTH DAILY AT 9AM (PATIENT NEEDS FURTHER EVALUATION AND/OR LABORATORY TESTING BEFORE FURTHER REFILLS ARE GIVEN) 06/01/24  Yes Antonio Cyndee Jamee JONELLE, DO  metoprolol  succinate (TOPROL -XL) 100 MG 24 hr tablet Take 100 mg by mouth at bedtime.   Yes [provider]  octreotide  (SANDOSTATIN  LAR DEPOT) 20 MG injection Inject 20 mg into the muscle every 28 (twenty-eight) days. 11/14/23  Yes Armbruster, Elspeth SQUIBB, MD  Vitamin D , Ergocalciferol , (DRISDOL ) 1.25 MG (50000 UNIT) CAPS capsule Take 50,000 Units by mouth every 30 (thirty) days.   Yes [provider]  amLODipine  (NORVASC ) 5 MG tablet Take 5 mg by mouth daily. Patient not taking: Reported on 06/08/2024 10/13/23   [provider]  apixaban  (ELIQUIS ) 5 MG TABS tablet Take 1 tablet (5 mg total) by mouth 2 (two) times daily. Patient not taking: Reported on 05/22/2024 02/03/23 05/03/24  Arlice Reichert, MD  Calcium  Carbonate Antacid (TUMS EXTRA STRENGTH 750 PO) Take 1-2 tablets by mouth every 6 (six) hours as needed (CHEW for heartburn). Patient not taking: Reported on 06/08/2024    [provider]  dicyclomine  (BENTYL ) 10 MG capsule Take 1 capsule (10 mg total) by  mouth every 8 (eight) hours as needed for spasms. Patient not taking: Reported on 06/08/2024 10/25/23   Leigh Elspeth SQUIBB, MD  folic acid  (FOLVITE ) 1 MG tablet Take 2 tablets (2 mg total)  by mouth daily. Patient not taking: Reported on 06/08/2024 05/26/17   Timmy Maude SAUNDERS, MD  GAS-X EXTRA STRENGTH 125 MG CAPS Take 125 mg by mouth at bedtime. Patient not taking: No sig reported    [provider]  OVER THE COUNTER MEDICATION Take 1 tablet by mouth See admin instructions. Nervive Nerve Health tablet- Take 1 tablet by mouth at bedtime Patient not taking: Reported on 06/08/2024    [provider]  Psyllium (METAMUCIL PREMIUM BLEND) 52.63 % POWD Take Daily Patient not taking: Reported on 06/08/2024 06/03/23   Armbruster, Elspeth SQUIBB, MD    Social History   Socioeconomic History   Marital status: Divorced    Spouse name: Not on file   Number of children: Not on file   Years of education: Not on file   Highest education level: Not on file  Occupational History   Not on file  Tobacco Use   Smoking status: Former    Current packs/day: 0.00    Average packs/day: 1 pack/day for 4.0 years (4.0 ttl pk-yrs)    Types: Cigarettes    Start date: 05/21/1975    Quit date: 05/21/1979    Years since quitting: 45.0   Smokeless tobacco: Never  Vaping Use   Vaping status: Never Used  Substance and Sexual Activity   Alcohol use: No    Alcohol/week: 0.0 standard drinks of alcohol   Drug use: No   Sexual activity: Yes    Birth control/protection: Post-menopausal    Comment: 1st intercourse 68 yo-Fewer than 5 partners  Other Topics Concern   Not on file  Social History Narrative   Not on file   Social Drivers of Health   Financial Resource Strain: Low Risk  (05/22/2024)   Overall Financial Resource Strain (CARDIA)    Difficulty of Paying Living Expenses: Not very hard  Food Insecurity: No Food Insecurity (06/08/2024)   Hunger Vital Sign    Worried About Running Out of Food in the  Last Year: Never true    Ran Out of Food in the Last Year: Never true  Transportation Needs: No Transportation Needs (06/08/2024)   PRAPARE - Administrator, Civil Service (Medical): No    Lack of Transportation (Non-Medical): No  Physical Activity: Insufficiently Active (05/22/2024)   Exercise Vital Sign    Days of Exercise per Week: 7 days    Minutes of Exercise per Session: 10 min  Stress: No Stress Concern Present (05/22/2024)   [employer] of Occupational Health - Occupational Stress Questionnaire    Feeling of Stress: Not at all  Social Connections: Socially Isolated (06/08/2024)   Social Connection and Isolation Panel    Frequency of Communication with Friends and Family: More than three times a week    Frequency of Social Gatherings with Friends and Family: More than three times a week    Attends Religious Services: Never    Database administrator or Organizations: No    Attends Banker Meetings: Never    Marital Status: Widowed  Intimate Partner Violence: Not At Risk (06/08/2024)   Humiliation, Afraid, Rape, and Kick questionnaire    Fear of Current or Ex-Partner: No    Emotionally Abused: No    Physically Abused: No    Sexually Abused: No    Family History  Problem Relation Age of Onset   Hypertension Mother    Heart disease Mother        s/p pci   Ulcerative colitis Father  Hypertension Father    Heart attack Father    Diabetes Sister    Cancer Sister        uterine   Pulmonary fibrosis Sister    Cancer Maternal Uncle        LUNG   Ulcerative colitis Daughter    Liver disease Daughter    Irritable bowel syndrome Other        grandchildren   Colon cancer Neg Hx    Esophageal cancer Neg Hx    Stomach cancer Neg Hx    Rectal cancer Neg Hx     ROS: Otherwise negative unless mentioned in HPI  Physical Examination  Vitals:   06/09/24 0301 06/09/24 0748  BP: (!) 147/71 (!) 177/90  Pulse: 100 (!) 102  Resp: 18 19  Temp: 98.5  F (36.9 C) 98.2 F (36.8 C)  SpO2: 98% 98%   Body mass index is 31.71 kg/m.  General: no acute distress Cardiac: hemodynamically stable Neuro: alert, no focal deficit Extremities: No edema cyanosis or wounds. Vascular:   Right: Palpable brachial, radial  Left: Palpable brachial, radial   Data:   Vein mapping reviewed No adequate sized superficial vein in either upper extremity.  Upper extremity arterial duplex reviewed Triphasic waveforms throughout bilateral upper extremities.    ASSESSMENT/PLAN: This is a 68 y.o. female with CKD stage V admitted with uremic symptoms and worsening metabolic acidosis.  Plan for access creation with tunneled dialysis catheter placement early next week.  Risks and benefits were reviewed for the right arm AV graft and TDC placement, she elects to proceed. Will find time and schedule either Monday or Tuesday.   Norman GORMAN Serve MD [facility] [phone] 06/09/2024  9:32 AM

## 2024-06-09 NOTE — Progress Notes (Signed)
 New Admission Note:   Arrival Method: Via [facility] from [facility] Orientation:  A & O x4 - Impulsive/Memory Impairment Telemetry: Box 5M15  Assessment: Completed Skin:  Draining Lesions with Erythema to Right Breast, Left Labia, and Left Posterior Hip IV:  Rt AC, LT AC, RT FA Pain: Denies Tubes:  RLQ Ileostomy Safety Measures: Safety Fall Prevention Plan has been discussed Admission: Completed 5 MW Orientation: Patient has been orientated to the room, unit and staff.  Family:  None at bedside  Patient has clothing, cell phone, and charger at bedside.  She left her glasses and upper and lower dentures at home.  Orders have been reviewed and implemented. Will continue to monitor the patient. Call light has been placed within reach and bed alarm has been activated.   Suzen Ice RN Phone number: [phone]

## 2024-06-09 NOTE — Care Management (Addendum)
 Transition of Care [facility]) - Inpatient Brief Assessment   Patient Details  Name: [name] MRN: [id number] Date of Birth: [birth year]  Transition of [facility]) CM/SW Contact:    Corean JAYSON Canary, RN Phone Number: 06/09/2024, 4:19 PM   Clinical Narrative:  68 year old patient presented with SHOB cough and genital herpes, wound. She has been taking valtrex . WOC saw the patient and documented treatments. No recommendations from PT. The patient was independent prior. The patient  has a right shoulder immobilizer just placed, is using RW while in hospital.  She has a right ileostomy She recently lost her partner whom she lived with and has stressors.   Will likely need HH for PT OT and try to get her to use a walker.   TOC will follow  Transition of Care Asessment: Insurance and Status: Insurance coverage has been reviewed Patient has primary care physician: Yes Home environment has been reviewed: Single family Prior level of function:: Independent Prior/Current Home Services: No current home services Social Drivers of Health Review: SDOH reviewed needs interventions (Social Isolation) Readmission risk has been reviewed: Yes Transition of care needs: no transition of care needs at this time

## 2024-06-09 NOTE — Progress Notes (Signed)
 [facility] Progress Note  Subjective:  Seen in room, no c/o's today Pt's daughter noted that pt has complicated GI hx related to her Crohns, stating that her large intestine is completely gone and she loses a lot of fluids from her ileostomy. She has required IVF's given 3x per week at a local infusion center.   Vitals:   06/08/24 1947 06/09/24 0003 06/09/24 0301 06/09/24 0748  BP: (!) 147/78 (!) 156/66 (!) 147/71 (!) 177/90  Pulse: 100 (!) 102 100 (!) 102  Resp: 18 18 18 19   Temp: 98.7 F (37.1 C) 98.5 F (36.9 C) 98.5 F (36.9 C) 98.2 F (36.8 C)  TempSrc: Oral Oral Oral   SpO2: 99% 97% 98% 98%  Weight:      Height:        Exam: Gen alert, no distress Sclera anicteric, throat clear  No jvd or bruits Chest clear bilat to bases RRR no MRG Abd soft ntnd no mass or ascites +bs Ext no LE or UE edema, no other edema Neuro is alert, Ox 3 , nf, mild asterixis   Home bp meds: Toprol  xl 100 every day   Date                 Creat               eGFR (ml/min) 2008- 2016 1.4- 2.5 2017- 2018 2.0- 2.6 2019- 2020 1.9- 2.4 2021                2.10- 4.94 2022- 2023 2.10- 6.61 2024                2.70- 4.00                                             Jan 2025         3.56                 12 ml/min                     Aug 2025         4.97                 8 ml/min                               06/07/24          5.00                 9                                  06/08/24          4.57                 10 ml/min                       Assessment/ Plan: CKD 5: w/ worsening anion gap and metabolic acidosis. Admitted for possible PNA, sepsis. She has advanced CKD 5 f/b Dr Rayburn at [facility] w/ numerous uremic symptoms including mostly severe fatigue, loss of appetite and more recently confusion. Does not need UA or renal US . Pt will  need to start dialysis, pt in agreement. Have consulted [facility] for access. Will follow.  Metabolic acidosis: due to CKD 5 and possibly also  chronic GI losses (see #4). Cont IV bicarb at 100 cc/hr for now as she also needs saline fluids for #4.  Anemia ckd: tsat 9%, ferritin 291. Will consult pharm for IV Fe loading and will start darbe 60 mcg weekly for now. 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  CAP: on IV abx per pmd Possible UTI: on abx per pmd     Myer Fret MD  [facility] 06/09/2024, 11:24 AM  Recent Labs  Lab 06/07/24 1853 06/08/24 0527 06/08/24 1858 06/09/24 0830  HGB 7.9* 7.5*  --  7.0*  ALBUMIN 3.8  --   --   --   CALCIUM  7.4* 6.7* 8.3*  --   PHOS  --  5.2*  --   --   CREATININE 5.00* 4.57* 4.63*  --   K 3.4* 3.1* 3.5  --    Recent Labs  Lab 06/08/24 1858  IRON  20*  TIBC 232*  FERRITIN 291   Inpatient medications:  sodium chloride    Intravenous Once   azithromycin  500 mg Oral Daily   calcitRIOL   0.25 mcg Oral Daily   Chlorhexidine  Gluconate Cloth  6 each Topical Daily   DULoxetine   60 mg Oral Daily   heparin   5,000 Units Subcutaneous Q8H   Influenza vac split trivalent PF  0.5 mL Intramuscular Tomorrow-1000   metoprolol  succinate  100 mg Oral QHS   valACYclovir   500 mg Oral Daily    cefTRIAXone  (ROCEPHIN )  IV 1 g (06/08/24 2144)   sodium bicarbonate  150 mEq in dextrose  5 % 1,150 mL infusion 100 mL/hr at 06/09/24 1054   acetaminophen , guaiFENesin-dextromethorphan, ipratropium-albuterol , melatonin, oxyCODONE , polyethylene glycol, prochlorperazine , sodium chloride  flush

## 2024-06-09 NOTE — Progress Notes (Signed)
 Orthopedic Tech Progress Note Patient Details:  [name] 07-May-1956 [id number]  Ortho Devices Type of Ortho Device: Shoulder immobilizer Ortho Device/Splint Location: RUE Ortho Device/Splint Interventions: Application   Post Interventions Patient Tolerated: Well  Lorie Cleckley E Jewett Mcgann 06/09/2024, 4:20 PM

## 2024-06-09 NOTE — Progress Notes (Signed)
 [name]                                                                               Porfiria Heinrich, is a 68 y.o. female, DOB - Dec 07, 1955, FMW:[id number] Admit date - 06/07/2024    Outpatient Primary MD for the patient is [name], [name], DO  LOS - 2  days    Brief summary   68 y.o. 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  She is being treated for LLL pneumonia and advanced CKD , possible HD catheter placement and start her on HD this admission.    Assessment & Plan    Assessment and Plan:     ESRD with worsening gap and metabolic acidosis:  Nephrology on board and to assist with initiating HD.  Vascular surgery consulted and plan for access soon.  Continue with IV bicarb for metabolic acidosis.    LLL pneumonia Started her on IV antibiotics.  Continue the same.  Procalcitonin is 1.  Improving leukocytosis.    Anemia sec to CKD - anemia panel showing low iron .  - hemoglobin around 7.  - stool for occult blood ordered.  - 1 unit of prbc transfusion ordered as her hemoglobin is 7 this morning.    Possible UTI;  UA shows large leukocytes with many bacteria. Urine cultures showing insignificant growth.  Blood cultures are pending and negative.     Dyspnea : Rule out CHF.  BNP is 1769.  Echo shows LVEF of 70 to 75% , hyperdynamic function. Indeterminate diastolic filling  due to E-A fusion.  Troponins are mildly elevated possibly from demand ischemia from ESRD, metabolic acidosis, and pneumonia.     Short gut syndrome/ Crohn's disease:  S/p total colectomy,  with right ileostomy.     Painful lesions on the labia suspicious for genital herpes Patient on valacyclovir ,.  Patient also reports white discharge .  Will add on a dose of diflucan .   Body mass index is 31.71 kg/m. Obesity   Hypokalemia and hypomagnesemia Replaced.     Estimated body mass index is 31.71 kg/m as calculated from the following:   Height as of this encounter: 5' 3 (1.6 m).   Weight as of this encounter: 81.2 kg.  Code Status: full code DVT Prophylaxis:  heparin  injection 5,000 Units Start: 06/07/24 2230   Level of Care: Level of care: Telemetry Medical Family Communication: family at bedside.   Disposition Plan:     Remains inpatient appropriate:  pending further work up.   Procedures:  Echocardiogram.   Consultants:   Nephrology  Vascular surgery.   Antimicrobials:   Anti-infectives (From admission, onward)    Start     Dose/Rate Route Frequency Ordered Stop   06/09/24 1730  fluconazole  (DIFLUCAN ) tablet 200 mg        200 mg Oral  Once 06/09/24 1632  06/08/24 2200  cefTRIAXone  (ROCEPHIN ) 1 g in sodium chloride  0.9 % 100 mL IVPB        1 g 200 mL/hr over 30 Minutes Intravenous Every 24 hours 06/07/24 2223     06/08/24 1000  azithromycin (ZITHROMAX) tablet 500 mg        500 mg Oral Daily 06/07/24 2223     06/08/24 1000  valACYclovir  (VALTREX ) tablet 500 mg        500 mg Oral Daily 06/07/24 2307 06/17/24 0959   06/07/24 2245  valACYclovir  (VALTREX ) tablet 500 mg  Status:  Discontinued        500 mg Oral Daily 06/07/24 2232 06/07/24 2307   06/07/24 2115  cefTRIAXone  (ROCEPHIN ) 1 g in sodium chloride  0.9 % 100 mL IVPB        1 g 200 mL/hr over 30 Minutes Intravenous  Once 06/07/24 2107 06/07/24 2255   06/07/24 2115  azithromycin (ZITHROMAX) tablet 500 mg        500 mg Oral  Once 06/07/24 2108 06/07/24 2203        Medications  Scheduled Meds:  sodium chloride    Intravenous Once   azithromycin  500 mg Oral Daily   calcitRIOL   0.25 mcg  Oral Daily   Chlorhexidine  Gluconate Cloth  6 each Topical Daily   darbepoetin (ARANESP) injection - DIALYSIS  60 mcg Subcutaneous Q Sat-1800   DULoxetine   60 mg Oral Daily   fluconazole   200 mg Oral Once   heparin   5,000 Units Subcutaneous Q8H   Influenza vac split trivalent PF  0.5 mL Intramuscular Tomorrow-1000   metoprolol  succinate  100 mg Oral QHS   valACYclovir   500 mg Oral Daily   Continuous Infusions:  cefTRIAXone  (ROCEPHIN )  IV 1 g (06/08/24 2144)   iron  sucrose Stopped (06/09/24 1538)   sodium bicarbonate  150 mEq in dextrose  5 % 1,150 mL infusion 100 mL/hr at 06/09/24 1539   PRN Meds:.acetaminophen , guaiFENesin-dextromethorphan, ipratropium-albuterol , melatonin, oxyCODONE , polyethylene glycol, prochlorperazine , sodium chloride  flush    Subjective:   [name] was seen and examined today.  White discharge in the urine.  Some abdominal discomfort.   Objective:   Vitals:   06/08/24 1947 06/09/24 0003 06/09/24 0301 06/09/24 0748  BP: (!) 147/78 (!) 156/66 (!) 147/71 (!) 177/90  Pulse: 100 (!) 102 100 (!) 102  Resp: 18 18 18 19   Temp: 98.7 F (37.1 C) 98.5 F (36.9 C) 98.5 F (36.9 C) 98.2 F (36.8 C)  TempSrc: Oral Oral Oral   SpO2: 99% 97% 98% 98%  Weight:      Height:        Intake/Output Summary (Last 24 hours) at 06/09/2024 1634 Last data filed at 06/09/2024 1305 Gross per 24 hour  Intake 2243.22 ml  Output 150 ml  Net 2093.22 ml   Filed Weights   06/07/24 1835 06/08/24 0719 06/08/24 1700  Weight: 78 kg 77.6 kg 81.2 kg     Exam General exam: ill appearing elderly woman, not in distress.  Respiratory system: Clear to auscultation. Respiratory effort normal. Cardiovascular system: S1 & S2 heard, RRR.  Gastrointestinal system: Abdomen is nondistended, soft and nontender. Central nervous system: Alert and oriented. No focal neurological deficits. Extremities: Symmetric 5 x 5 power. Skin: No rashes,    Data Reviewed:  I have personally reviewed  following labs and imaging studies   CBC Lab Results  Component Value Date   WBC 13.5 (H) 06/09/2024   RBC 2.13 (L) 06/09/2024   HGB 7.0 (  L) 06/09/2024   HCT 20.9 (L) 06/09/2024   MCV 98.1 06/09/2024   MCH 32.9 06/09/2024   PLT 319 06/09/2024   MCHC 33.5 06/09/2024   RDW 15.9 (H) 06/09/2024   LYMPHSABS 1.5 06/09/2024   MONOABS 1.0 06/09/2024   EOSABS 0.5 06/09/2024   BASOSABS 0.1 06/09/2024     Last metabolic panel Lab Results  Component Value Date   NA 137 06/08/2024   K 3.5 06/08/2024   CL 112 (H) 06/08/2024   CO2 10 (L) 06/08/2024   BUN 51 (H) 06/08/2024   CREATININE 4.63 (H) 06/08/2024   GLUCOSE 186 (H) 06/08/2024   GFRNONAA 10 (L) 06/08/2024   GFRAA 24 (L) 08/16/2018   CALCIUM  8.3 (L) 06/08/2024   PHOS 5.2 (H) 06/08/2024   PROT 7.6 06/07/2024   ALBUMIN 3.8 06/07/2024   BILITOT 0.4 06/07/2024   ALKPHOS 174 (H) 06/07/2024   AST 24 06/07/2024   ALT 22 06/07/2024   ANIONGAP 15 06/08/2024    CBG (last 3)  No results for input(s): GLUCAP in the last 72 hours.    Coagulation Profile: Recent Labs  Lab 06/07/24 1853  INR 1.2     Radiology Studies: ECHOCARDIOGRAM COMPLETE Result Date: 06/08/2024    ECHOCARDIOGRAM REPORT   Patient Name:   [facility] MEGGIE LASETER Date of Exam: 06/08/2024 Medical Rec #:  [id number]       Height:       63.0 in Accession #:    [id number]      Weight:       171.0 lb Date of Birth:  05-Sep-1956       BSA:          1.809 m Patient Age:    68 years        BP:           139/71 mmHg Patient Gender: F               HR:           101 bpm. Exam Location:  Inpatient Procedure: 2D Echo, Cardiac Doppler and Color Doppler (Both Spectral and Color            Flow Doppler were utilized during procedure). Indications:    Elevated troponin R79.89  History:        Patient has no prior history of Echocardiogram examinations.                 Signs/Symptoms:Shortness of Breath; Risk Factors:Hypertension.                 H/O Pulmonary nodule, Chronic kidney  disease, Weakness,                 Hyperlipidemia.  Sonographer:    BERNARDA ROCKS Referring Phys: [id number] CAROLE N HALL IMPRESSIONS  1. Left ventricular ejection fraction, by estimation, is 70 to 75%. The left ventricle has hyperdynamic function. The left ventricle has no regional wall motion abnormalities. Indeterminate diastolic filling due to E-A fusion.  2. Right ventricular systolic function is normal. The right ventricular size is normal.  3. The mitral valve is normal in structure. Mild mitral valve regurgitation.  4. The aortic valve is tricuspid. Aortic valve regurgitation is mild.  5. The inferior vena cava is normal in size with greater than 50% respiratory variability, suggesting right atrial pressure of 3 mmHg. FINDINGS  Left Ventricle: Left ventricular ejection fraction, by estimation, is 70 to 75%. The left ventricle has hyperdynamic function. The left ventricle  has no regional wall motion abnormalities. The left ventricular internal cavity size was normal in size. There is no left ventricular hypertrophy. Indeterminate diastolic filling due to E-A fusion. Right Ventricle: The right ventricular size is normal. Right vetricular wall thickness was not assessed. Right ventricular systolic function is normal. Left Atrium: Left atrial size was normal in size. Right Atrium: Right atrial size was normal in size. Pericardium: There is no evidence of pericardial effusion. Mitral Valve: The mitral valve is normal in structure. Mild mitral valve regurgitation. MV peak gradient, 12.2 mmHg. The mean mitral valve gradient is 6.0 mmHg. Tricuspid Valve: The tricuspid valve is normal in structure. Tricuspid valve regurgitation is trivial. Aortic Valve: The aortic valve is tricuspid. Aortic valve regurgitation is mild. Aortic regurgitation PHT measures 357 msec. Aortic valve mean gradient measures 7.0 mmHg. Aortic valve peak gradient measures 12.5 mmHg. Aortic valve area, by VTI measures 2.62 cm. Pulmonic Valve: The  pulmonic valve was normal in structure. Pulmonic valve regurgitation is not visualized. Aorta: The aortic root and ascending aorta are structurally normal, with no evidence of dilitation. Venous: The inferior vena cava is normal in size with greater than 50% respiratory variability, suggesting right atrial pressure of 3 mmHg. IAS/Shunts: No atrial level shunt detected by color flow Doppler.  LEFT VENTRICLE PLAX 2D LVIDd:         4.40 cm      Diastology LVIDs:         2.50 cm      LV e' medial:    8.89 cm/s LV PW:         0.90 cm      LV E/e' medial:  13.7 LV IVS:        0.80 cm      LV e' lateral:   14.70 cm/s LVOT diam:     1.80 cm      LV E/e' lateral: 8.3 LV SV:         90 LV SV Index:   50 LVOT Area:     2.54 cm  LV Volumes (MOD) LV vol d, MOD A2C: 106.0 ml LV vol d, MOD A4C: 117.0 ml LV vol s, MOD A2C: 33.0 ml LV vol s, MOD A4C: 40.6 ml LV SV MOD A2C:     73.0 ml LV SV MOD A4C:     117.0 ml LV SV MOD BP:      75.2 ml RIGHT VENTRICLE             IVC RV Basal diam:  2.60 cm     IVC diam: 1.40 cm RV S prime:     21.10 cm/s TAPSE (M-mode): 2.3 cm RVSP:           25.7 mmHg LEFT ATRIUM             Index        RIGHT ATRIUM           Index LA diam:        2.80 cm 1.55 cm/m   RA Pressure: 3.00 mmHg LA Vol (A2C):   45.3 ml 25.04 ml/m  RA Area:     10.30 cm LA Vol (A4C):   39.8 ml 22.00 ml/m  RA Volume:   21.80 ml  12.05 ml/m LA Biplane Vol: 45.8 ml 25.32 ml/m  AORTIC VALVE                     PULMONIC VALVE AV Area (Vmax):    2.62  cm      PV Vmax:          1.50 m/s AV Area (Vmean):   2.63 cm      PV Peak grad:     9.0 mmHg AV Area (VTI):     2.62 cm      PR End Diast Vel: 3.01 msec AV Vmax:           177.00 cm/s AV Vmean:          119.000 cm/s AV VTI:            0.345 m AV Peak Grad:      12.5 mmHg AV Mean Grad:      7.0 mmHg LVOT Vmax:         182.00 cm/s LVOT Vmean:        123.000 cm/s LVOT VTI:          0.355 m LVOT/AV VTI ratio: 1.03 AI PHT:            357 msec  AORTA Ao Root diam: 2.80 cm Ao Asc diam:   3.20 cm MITRAL VALVE                TRICUSPID VALVE MV Area (PHT): 9.03 cm     TR Peak grad:   22.7 mmHg MV Area VTI:   3.05 cm     TR Vmax:        238.00 cm/s MV Peak grad:  12.2 mmHg    Estimated RAP:  3.00 mmHg MV Mean grad:  6.0 mmHg     RVSP:           25.7 mmHg MV Vmax:       1.75 m/s MV Vmean:      112.0 cm/s   SHUNTS MV Decel Time: 84 msec      Systemic VTI:  0.36 m MR Peak grad: 126.3 mmHg    Systemic Diam: 1.80 cm MR Vmax:      562.00 cm/s MV E velocity: 122.00 cm/s MV A velocity: 156.00 cm/s MV E/A ratio:  0.78 Vina Gull MD Electronically signed by Vina Gull MD Signature Date/Time: 06/08/2024/5:13:36 PM    Final    DG Chest Port 1 View Result Date: 06/07/2024 CLINICAL DATA:  Questionable sepsis - evaluate for abnormality EXAM: PORTABLE CHEST 1 VIEW COMPARISON:  Chest x-ray 09/13/2023, CT chest 02/15/2023 FINDINGS: Right chest wall Port-A-Cath with tip at the superior cavoatrial junction. The heart and mediastinal contours are within normal limits. Question left base airspace opacity. No pulmonary edema. No pleural effusion. No pneumothorax. No acute osseous abnormality. IMPRESSION: Question left base airspace opacity. Recommend repeat chest x-ray PA and lateral view for further evaluation. Electronically Signed   By: Morgane  Naveau M.D.   On: 06/07/2024 19:22       Elgie Butter M.D. [name] 06/09/2024, 4:34 PM  Available via Epic secure chat 7am-7pm After 7 pm, please refer to night coverage provider listed on amion.

## 2024-06-09 NOTE — Evaluation (Signed)
 Physical Therapy Evaluation Patient Details Name: [name] MRN: [id number] DOB: 04-28-1956 Today's Date: 06/09/2024  History of Present Illness  68 y.o. female with admitted 10/2 with possible left lower lobe community-acquired bacterial pneumonia, Possible UTI, acute metabolic acidosis. 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  Clinical Impression  Pt admitted with above diagnosis. Independent PTA, living with her mother however elderly mother able to care for herself. Pt reports lots of family/friends in the area to assist as needed at d/c. Pt Required CGA for transfer and short distance gait in room. Grossly stable, declined RW but after discussion more open to use remainder of admission. Easily fatigued with evaluation today (low Hgb.)  Anticipate good recovery with resolution of medical issues. Will progress as tolerated, encouraged to keep mobile during admission, perform LE exercises as demonstrated today. Pt currently with functional limitations due to the deficits listed below (see PT Problem List). Pt will benefit from acute skilled PT to increase their independence and safety with mobility to allow discharge.           If plan is discharge home, recommend the following: A little help with walking and/or transfers;A little help with bathing/dressing/bathroom;Assistance with cooking/housework;Assist for transportation;Help with stairs or ramp for entrance   Can travel by private vehicle        Equipment Recommendations None recommended by PT  Recommendations for Other Services       Functional Status Assessment Patient has had a recent decline in their functional status and demonstrates the ability to make significant improvements in  function in a reasonable and predictable amount of time.     Precautions / Restrictions Precautions Precautions: Fall Recall of Precautions/Restrictions: Intact Restrictions Weight Bearing Restrictions Per Provider Order: No      Mobility  Bed Mobility Overal bed mobility: Modified Independent             General bed mobility comments: Mod I to rise to EOB, no assist, extra time.    Transfers Overall transfer level: Needs assistance Equipment used: None Transfers: Sit to/from Stand Sit to Stand: Contact guard assist           General transfer comment: Declined to use RW. Pt with some unsteadiness upon rising, reaches for footboard to steady. Cues for safety and awareness, suggested RW.    Ambulation/Gait Ambulation/Gait assistance: Contact guard assist Gait Distance (Feet): 15 Feet Assistive device: None Gait Pattern/deviations: Step-through pattern, Decreased stride length, Narrow base of support, Trunk flexed, Shuffle, Scissoring Gait velocity: dec Gait velocity interpretation: <1.31 ft/sec, indicative of household ambulator   General Gait Details: Declined RW despite suggestion due to unsteadiness in standing. Pt furniture surfing to walk around bed to recliner. Declines further distance into hallway. Educated on deficits noticed (narrow BOS scissoring, gross instability, fall risk.) Pt seemed more open to RW use. Educated on safety with mobility and to use RW when mobilizing during admission.  Stairs            Wheelchair Mobility     Tilt Bed    Modified Rankin (Stroke Patients Only)       Balance Overall balance assessment: Needs assistance Sitting-balance support: Feet supported, No upper extremity supported Sitting balance-Leahy Scale: Normal     Standing balance support: No upper extremity supported Standing balance-Leahy Scale:  Fair                               Pertinent Vitals/Pain Pain Assessment Pain Assessment:  No/denies pain    Home Living Family/patient expects to be discharged to:: Private residence Living Arrangements: Parent Available Help at Discharge: Family;Available 24 hours/day Type of Home: House Home Access: Stairs to enter   [employer] of Steps: 2   Home Layout: One level Home Equipment: Agricultural consultant (2 wheels);Cane - single point;BSC/3in1;Shower seat;Wheelchair - manual;Electric scooter;Lift chair Additional Comments: Stay at mothers house    Prior Function Prior Level of Function : Independent/Modified Independent;History of Falls (last six months)             Mobility Comments: ind, one fall past 6 months, no AD for gait ADLs Comments: ind     Extremity/Trunk Assessment   Upper Extremity Assessment Upper Extremity Assessment: Defer to OT evaluation    Lower Extremity Assessment Lower Extremity Assessment: Generalized weakness       Communication   Communication Communication: No apparent difficulties    Cognition Arousal: Alert Behavior During Therapy: WFL for tasks assessed/performed   PT - Cognitive impairments: No apparent impairments                         Following commands: Intact       Cueing Cueing Techniques: Verbal cues     General Comments General comments (skin integrity, edema, etc.): Easily fatigued, she is hopeful energy will improve with transfusion of blood this afternoon.    Exercises General Exercises - Lower Extremity Ankle Circles/Pumps: AROM, Both, 10 reps, Seated Quad Sets: Strengthening, Both, 10 reps, Seated Gluteal Sets: Strengthening, Both, 10 reps, Seated   Assessment/Plan    PT Assessment Patient needs continued PT services  PT Problem List Decreased strength;Decreased activity tolerance;Decreased balance;Decreased mobility;Decreased coordination;Decreased knowledge of use of DME;Decreased safety awareness;Decreased knowledge of precautions;Cardiopulmonary status limiting activity        PT Treatment Interventions DME instruction;Gait training;Stair training;Functional mobility training;Therapeutic activities;Therapeutic exercise;Balance training;Neuromuscular re-education;Patient/family education    PT Goals (Current goals can be found in the Care Plan section)  Acute Rehab PT Goals Patient Stated Goal: Get well, return home PT Goal Formulation: With patient Time For Goal Achievement: 06/23/24 Potential to Achieve Goals: Good    Frequency Min 2X/week     Co-evaluation               AM-PAC PT 6 Clicks Mobility  Outcome Measure Help needed turning from your back to your side while in a flat bed without using bedrails?: None Help needed moving from lying on your back to sitting on the side of a flat bed without using bedrails?: None Help needed moving to and from a bed to a chair (including a wheelchair)?: A Little Help needed standing up from a chair using your arms (e.g., wheelchair or bedside chair)?: A Little Help needed to walk in hospital room?: A Little Help needed climbing 3-5 steps with a railing? : A Lot 6 Click Score: 19    End of Session Equipment Utilized During Treatment: Gait belt Activity Tolerance: Patient tolerated treatment well;Patient limited by fatigue Patient left: in chair;with call bell/phone within reach;with chair alarm set   PT Visit Diagnosis: Unsteadiness on feet (R26.81);Other abnormalities of gait and mobility (R26.89);Muscle weakness (generalized) (M62.81);History of falling (Z91.81);Difficulty in walking, not elsewhere classified (R26.2)    Time:  8592-8573 PT Time Calculation (min) (ACUTE ONLY): 19 min   Charges:   PT Evaluation $PT Eval Low Complexity: 1 Low   PT General Charges $$ ACUTE PT VISIT: 1 Visit         Leontine Roads, PT, DPT [facility]  Rehabilitation Services Physical Therapist Office: [phone] Website: [email or web address]   Leontine GORMAN Roads 06/09/2024, 3:02 PM

## 2024-06-10 ENCOUNTER — Inpatient Hospital Stay (HOSPITAL_COMMUNITY)

## 2024-06-10 DIAGNOSIS — N189 Chronic kidney disease, unspecified: Secondary | ICD-10-CM | POA: Diagnosis not present

## 2024-06-10 DIAGNOSIS — A6004 Herpesviral vulvovaginitis: Secondary | ICD-10-CM | POA: Diagnosis not present

## 2024-06-10 DIAGNOSIS — N186 End stage renal disease: Secondary | ICD-10-CM | POA: Diagnosis not present

## 2024-06-10 DIAGNOSIS — J189 Pneumonia, unspecified organism: Secondary | ICD-10-CM | POA: Diagnosis not present

## 2024-06-10 LAB — TYPE AND SCREEN
ABO/RH(D): B POS
Antibody Screen: NEGATIVE
Unit division: 0

## 2024-06-10 LAB — CBC WITH DIFFERENTIAL/PLATELET
Abs Immature Granulocytes: 0.09 K/uL — ABNORMAL HIGH (ref 0.00–0.07)
Basophils Absolute: 0.1 K/uL (ref 0.0–0.1)
Basophils Relative: 0 %
Eosinophils Absolute: 0.6 K/uL — ABNORMAL HIGH (ref 0.0–0.5)
Eosinophils Relative: 5 %
HCT: 26.5 % — ABNORMAL LOW (ref 36.0–46.0)
Hemoglobin: 8.7 g/dL — ABNORMAL LOW (ref 12.0–15.0)
Immature Granulocytes: 1 %
Lymphocytes Relative: 18 %
Lymphs Abs: 2.2 K/uL (ref 0.7–4.0)
MCH: 31.6 pg (ref 26.0–34.0)
MCHC: 32.8 g/dL (ref 30.0–36.0)
MCV: 96.4 fL (ref 80.0–100.0)
Monocytes Absolute: 0.9 K/uL (ref 0.1–1.0)
Monocytes Relative: 7 %
Neutro Abs: 8.3 K/uL — ABNORMAL HIGH (ref 1.7–7.7)
Neutrophils Relative %: 69 %
Platelets: 338 K/uL (ref 150–400)
RBC: 2.75 MIL/uL — ABNORMAL LOW (ref 3.87–5.11)
RDW: 16.1 % — ABNORMAL HIGH (ref 11.5–15.5)
WBC: 12.1 K/uL — ABNORMAL HIGH (ref 4.0–10.5)
nRBC: 0.2 % (ref 0.0–0.2)

## 2024-06-10 LAB — BASIC METABOLIC PANEL WITH GFR
Anion gap: 14 (ref 5–15)
Anion gap: 14 (ref 5–15)
BUN: 33 mg/dL — ABNORMAL HIGH (ref 8–23)
BUN: 36 mg/dL — ABNORMAL HIGH (ref 8–23)
CO2: 19 mmol/L — ABNORMAL LOW (ref 22–32)
CO2: 22 mmol/L (ref 22–32)
Calcium: 7.8 mg/dL — ABNORMAL LOW (ref 8.9–10.3)
Calcium: 7.9 mg/dL — ABNORMAL LOW (ref 8.9–10.3)
Chloride: 104 mmol/L (ref 98–111)
Chloride: 105 mmol/L (ref 98–111)
Creatinine, Ser: 4.3 mg/dL — ABNORMAL HIGH (ref 0.44–1.00)
Creatinine, Ser: 4.44 mg/dL — ABNORMAL HIGH (ref 0.44–1.00)
GFR, Estimated: 10 mL/min — ABNORMAL LOW (ref >60)
GFR, Estimated: 11 mL/min — ABNORMAL LOW (ref >60)
Glucose, Bld: 143 mg/dL — ABNORMAL HIGH (ref 70–99)
Glucose, Bld: 147 mg/dL — ABNORMAL HIGH (ref 70–99)
Potassium: 2.4 mmol/L — CL (ref 3.5–5.1)
Potassium: 2.4 mmol/L — CL (ref 3.5–5.1)
Sodium: 138 mmol/L (ref 135–145)
Sodium: 140 mmol/L (ref 135–145)

## 2024-06-10 LAB — BPAM RBC
Blood Product Expiration Date: 202510302359
ISSUE DATE / TIME: 202510041757
Unit Type and Rh: 7300

## 2024-06-10 LAB — SURGICAL PCR SCREEN
MRSA, PCR: NEGATIVE
Staphylococcus aureus: POSITIVE — AB

## 2024-06-10 LAB — HEMOGLOBIN AND HEMATOCRIT, BLOOD
HCT: 25.6 % — ABNORMAL LOW (ref 36.0–46.0)
Hemoglobin: 8.6 g/dL — ABNORMAL LOW (ref 12.0–15.0)

## 2024-06-10 MED ORDER — HYDRALAZINE HCL 20 MG/ML IJ SOLN: 5.0000 mg | Freq: Four times a day (QID) | INTRAMUSCULAR | Status: DC | PRN

## 2024-06-10 MED ORDER — POTASSIUM CHLORIDE CRYS ER 20 MEQ PO TBCR
40.0000 meq | EXTENDED_RELEASE_TABLET | ORAL | Status: AC
  Administered 2024-06-10 (×2): 40 meq via ORAL
  Filled 2024-06-10 (×2): qty 2

## 2024-06-10 MED ORDER — ALLOPURINOL 300 MG PO TABS: 300.0000 mg | ORAL_TABLET | Freq: Every day | ORAL | Status: DC

## 2024-06-10 MED ORDER — AMLODIPINE BESYLATE 10 MG PO TABS
10.0000 mg | ORAL_TABLET | Freq: Every day | ORAL | Status: DC
  Administered 2024-06-10 – 2024-06-13 (×4): 10 mg via ORAL
  Filled 2024-06-10 (×4): qty 1

## 2024-06-10 MED ORDER — HYDRALAZINE HCL 25 MG PO TABS
25.0000 mg | ORAL_TABLET | Freq: Three times a day (TID) | ORAL | Status: DC
  Administered 2024-06-10 – 2024-06-11 (×4): 25 mg via ORAL
  Filled 2024-06-10 (×4): qty 1

## 2024-06-10 MED ORDER — ALLOPURINOL 300 MG PO TABS
150.0000 mg | ORAL_TABLET | Freq: Every day | ORAL | Status: DC
  Administered 2024-06-10 – 2024-06-13 (×4): 150 mg via ORAL
  Filled 2024-06-10 (×4): qty 1

## 2024-06-10 MED ORDER — POTASSIUM CHLORIDE CRYS ER 20 MEQ PO TBCR
40.0000 meq | EXTENDED_RELEASE_TABLET | Freq: Once | ORAL | Status: AC
  Administered 2024-06-10: 40 meq via ORAL
  Filled 2024-06-10: qty 2

## 2024-06-10 NOTE — Progress Notes (Signed)
  Daily Progress Note   Subjective: No complaints  Objective: Vitals:   06/10/24 0424 06/10/24 0902  BP: (!) 162/88 (!) 181/90  Pulse: 86 87  Resp: 17 19  Temp: 98.2 F (36.8 C) 98.3 F (36.8 C)  SpO2: 96% 100%    Physical Examination HDS Nonlabored breathing Palpable right radial and brachial. Tunneled infusion line present on right chest  ASSESSMENT/PLAN:  68 year old female with progression to ESRD now needing dialysis access.  Plan for conversion of her tunneled infusion line to a [facility] and a right arm AVG on Tuesday with Dr. Sheree.  Please make n.p.o. midnight Monday night   Norman GORMAN Serve MD [facility] [phone] 06/10/2024  9:41 AM

## 2024-06-10 NOTE — Progress Notes (Signed)
 0900: Q, NT bedside obtaining vitals for patient. Nephrology Provider bedside speaking with patient. Sodium Bicarb continuously running, and LFA IV is KVO, to ensure proper access. Patient asking about her gout medication. This nurse will consult with provider [phone]: Dr. Cherlyn contacted about patient's concern with firmness to perineal area and gout treatment. New orders placed. Per Dr. Cherlyn OK to d/c telemetry  [phone]: CCMD contacted about telemetry d/c  1000: CT tech contacts about transportation necessity for imaging  1013: Ostomy drained for patient-328mL, External Catheter Container Drained-553mL 1530: Pad and patient change. Unmeasured urine occurrence x 1 (large amount)  L labia cleansed and dressed according to order. New purwick in place, patient supplied depends on.  1746: Wound care to L breast complete  Report given to Luke, RN about patient. Patient is questing the diagnosis of wounds present on vagina and breast. Recommend contact with provider to solidify the patient's concern on this.

## 2024-06-10 NOTE — Progress Notes (Signed)
   06/10/24 0551  Provider Notification  Provider Name/Title Dr. Alfornia  Date Provider Notified 06/10/24  Time Provider Notified [phone]  Method of Notification Page  Notification Reason Critical Result  Test performed and critical result K+ 2.4  Date Critical Result Received 06/10/24  Time Critical Result Received 0545  Provider response See new orders   Dr. Alfornia notified of critical K+ of 2.4.  New order received and implemented.  Suzen Ice RN

## 2024-06-10 NOTE — Progress Notes (Signed)
 [facility] Progress Note  Subjective:  Seen in room, no c/o's today Seen by VVS, plan is for [facility] and AVG on tuesday  Vitals:   06/09/24 1952 06/09/24 2135 06/10/24 0424 06/10/24 0902  BP: (!) 170/92 (!) 170/93 (!) 162/88 (!) 181/90  Pulse: 97 88 86 87  Resp: (!) 22  17 19   Temp: 98.5 F (36.9 C) 98.7 F (37.1 C) 98.2 F (36.8 C) 98.3 F (36.8 C)  TempSrc: Oral Oral Oral   SpO2: 99% 100% 96% 100%  Weight:      Height:        Exam: Gen alert, no distress Sclera anicteric, throat clear  No jvd or bruits Chest clear bilat to bases RRR no MRG Abd soft ntnd no mass or ascites +bs Ext no LE or UE edema, no other edema Neuro is alert, Ox 3 , nf, mild asterixis   Home bp meds: Toprol  xl 100 every day   Date                 Creat               eGFR (ml/min) 2008- 2016 1.4- 2.5 2017- 2018 2.0- 2.6 2019- 2020 1.9- 2.4 2021                2.10- 4.94 2022- 2023 2.10- 6.61 2024                2.70- 4.00                                             Jan 2025         3.56                 12 ml/min                     Aug 2025         4.97                 8 ml/min                               06/07/24          5.00                 9                                  06/08/24          4.57                 10 ml/min                       Assessment/ Plan: CKD 5: w/ worsening anion gap and metabolic acidosis. Admitted for possible PNA, sepsis. She has advanced CKD 5 f/b Dr Rayburn at [facility] w/ uremic symptoms including fatigue, loss of appetite and more recently confusion. Plan is for HD initiation. Seen by VVS, plan is for [facility] and AVG tuesday. Will start HD thereafter. Will need CLIP as well on Monday.   Metabolic acidosis: due to CKD 5 and chronic GI losses (see #4). Cont IV bicarb at 100 cc/hr for  now.  Anemia ckd: tsat 9%, ferritin 291. IV iron  load ordered as venofer  200mg  daily x 5.  Starting also darbe 60 mcg weekly for now. 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  CAP: on IV abx per pmd Possible UTI: on abx per pmd     Myer Fret MD  [facility] 06/10/2024, 10:36 AM  Recent Labs  Lab 06/07/24 1853 06/08/24 0527 06/08/24 1858 06/09/24 2347 06/10/24 0500  HGB 7.9* 7.5*   < > 8.6* 8.7*  ALBUMIN 3.8  --   --   --   --   CALCIUM  7.4* 6.7*   < > 7.9* 7.8*  PHOS  --  5.2*  --   --   --   CREATININE 5.00* 4.57*   < > 4.44* 4.30*  K 3.4* 3.1*   < > 2.4* 2.4*   < > = values in this interval not displayed.   Recent Labs  Lab 06/08/24 1858  IRON  20*  TIBC 232*  FERRITIN 291   Inpatient medications:  sodium chloride    Intravenous Once   allopurinol   150 mg Oral Q2000   azithromycin  500 mg Oral Daily   calcitRIOL   0.25 mcg Oral Daily   Chlorhexidine  Gluconate Cloth  6 each Topical Daily   darbepoetin (ARANESP) injection - DIALYSIS  60 mcg Subcutaneous Q Sat-1800   DULoxetine   60 mg Oral Daily   heparin   5,000 Units Subcutaneous Q8H   Influenza vac split trivalent PF  0.5 mL Intramuscular Tomorrow-1000   metoprolol  succinate  100 mg Oral QHS   valACYclovir   500 mg Oral Daily    cefTRIAXone  (ROCEPHIN )  IV Stopped (06/10/24 0730)   iron  sucrose Stopped (06/09/24 1538)   sodium bicarbonate  150 mEq in dextrose  5 % 1,150 mL infusion 100 mL/hr at 06/10/24 0313   acetaminophen , guaiFENesin-dextromethorphan, ipratropium-albuterol , melatonin, oxyCODONE , polyethylene glycol, prochlorperazine , sodium chloride  flush

## 2024-06-10 NOTE — Progress Notes (Signed)
 [facility]                                                                               [name], is a 68 y.o. female, DOB - October 23, 1955, FMW:[id number] Admit date - 06/07/2024    Outpatient Primary MD for the patient is [name], Jeanette SAUNDERS, DO  LOS - 3  days    Brief summary   68 y.o. 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  She is being treated for LLL pneumonia and advanced CKD , possible HD catheter placement and start her on HD this admission.    Assessment & Plan    Assessment and Plan:   ESRD with worsening gap and metabolic acidosis:  Nephrology on board and to assist with initiating HD.  Vascular surgery consulted and plan for HD access placement Monday or Tuesday.  Continue with IV bicarb for metabolic acidosis.    LLL pneumonia Started her on IV antibiotics.  Continue the same.  Procalcitonin is 1.  Improving leukocytosis. She remains afebrile.    Uncontrolled hypertension/ Hypertensive urgency:  Sub optimal. Added norvasc  and hydralazine prn.    Anemia sec to CKD - anemia panel showing low iron .  - hemoglobin around 7.  - stool for occult blood ordered.  - 1 unit of prbc transfusion ordered as her hemoglobin is 7 this morning.    Possible UTI;  UA shows large leukocytes with many bacteria. Urine cultures showing insignificant growth.  Blood cultures are pending and negative.  Continue with IV ceftriaxone .     Dyspnea : Appears to have improved.  Rule out CHF.  BNP is 1769.  Echo shows LVEF of 70 to 75% , hyperdynamic function. Indeterminate  diastolic filling  due to E-A fusion.  Troponins are mildly elevated possibly from demand ischemia from ESRD, metabolic acidosis, and pneumonia.     Short gut syndrome/ Crohn's disease:  S/p total colectomy, with right ileostomy.     Painful lesions on the labia suspicious for genital herpes  Right external labia is swollen, indurated and tender. Getting CT abdomen and pelvis to evaluate for abscess.  Patient on valacyclovir ,.  Patient also reports white discharge .  Will add on a dose of diflucan .    Gout:  Resume allopurinol   Body mass index is 31.71 kg/m. Obesity   Hypokalemia and hypomagnesemia Replaced. Repeat later this afternoon.     Estimated body mass index is 31.71 kg/m as calculated from the following:   Height as of this encounter: 5' 3 (1.6 m).   Weight as of this encounter: 81.2 kg.  Code Status: full code DVT Prophylaxis:  heparin  injection 5,000 Units Start: 06/07/24 2230   Level of Care: Level of care: Telemetry Medical Family Communication: family at bedside.   Disposition Plan:     Remains inpatient appropriate:  further work up.  Procedures:  Echocardiogram.   Consultants:   Nephrology  Vascular surgery.   Antimicrobials:   Anti-infectives (From admission, onward)    Start     Dose/Rate Route Frequency Ordered Stop   06/09/24 1730  fluconazole  (DIFLUCAN ) tablet 200 mg        200 mg Oral  Once 06/09/24 1632 06/09/24 1900   06/08/24 2200  cefTRIAXone  (ROCEPHIN ) 1 g in sodium chloride  0.9 % 100 mL IVPB        1 g 200 mL/hr over 30 Minutes Intravenous Every 24 hours 06/07/24 2223     06/08/24 1000  azithromycin (ZITHROMAX) tablet 500 mg        500 mg Oral Daily 06/07/24 2223     06/08/24 1000  valACYclovir  (VALTREX ) tablet 500 mg        500 mg Oral Daily 06/07/24 2307 06/17/24 0959   06/07/24 2245  valACYclovir  (VALTREX ) tablet 500 mg  Status:  Discontinued        500 mg Oral Daily 06/07/24 2232 06/07/24 2307   06/07/24 2115   cefTRIAXone  (ROCEPHIN ) 1 g in sodium chloride  0.9 % 100 mL IVPB        1 g 200 mL/hr over 30 Minutes Intravenous  Once 06/07/24 2107 06/07/24 2255   06/07/24 2115  azithromycin (ZITHROMAX) tablet 500 mg        500 mg Oral  Once 06/07/24 2108 06/07/24 2203        Medications  Scheduled Meds:  sodium chloride    Intravenous Once   allopurinol   150 mg Oral Q2000   amLODipine   10 mg Oral Daily   azithromycin  500 mg Oral Daily   calcitRIOL   0.25 mcg Oral Daily   Chlorhexidine  Gluconate Cloth  6 each Topical Daily   darbepoetin (ARANESP) injection - DIALYSIS  60 mcg Subcutaneous Q Sat-1800   DULoxetine   60 mg Oral Daily   heparin   5,000 Units Subcutaneous Q8H   hydrALAZINE  25 mg Oral Q8H   Influenza vac split trivalent PF  0.5 mL Intramuscular Tomorrow-1000   metoprolol  succinate  100 mg Oral QHS   valACYclovir   500 mg Oral Daily   Continuous Infusions:  cefTRIAXone  (ROCEPHIN )  IV Stopped (06/10/24 0730)   iron  sucrose 200 mg (06/10/24 1156)   sodium bicarbonate  150 mEq in dextrose  5 % 1,150 mL infusion 100 mL/hr at 06/10/24 0313   PRN Meds:.acetaminophen , guaiFENesin-dextromethorphan, hydrALAZINE, ipratropium-albuterol , melatonin, oxyCODONE , polyethylene glycol, prochlorperazine , sodium chloride  flush    Subjective:   [name] was seen and examined today.  Discomfort and some pain in the LLQ   Objective:   Vitals:   06/09/24 1952 06/09/24 2135 06/10/24 0424 06/10/24 0902  BP: (!) 170/92 (!) 170/93 (!) 162/88 (!) 181/90  Pulse: 97 88 86 87  Resp: (!) 22  17 19   Temp: 98.5 F (36.9 C) 98.7 F (37.1 C) 98.2 F (36.8 C) 98.3 F (36.8 C)  TempSrc: Oral Oral Oral   SpO2: 99% 100% 96% 100%  Weight:      Height:        Intake/Output Summary (Last 24 hours) at 06/10/2024 1305 Last data filed at 06/10/2024 0900 Gross per 24 hour  Intake 2637.98 ml  Output 1850 ml  Net 787.98 ml   Filed Weights   06/07/24 1835 06/08/24 0719 06/08/24 1700  Weight: 78 kg 77.6 kg 81.2  kg     General exam: Appears calm and comfortable  Respiratory system: Clear to auscultation. Respiratory effort normal. Cardiovascular system: S1 &  S2 heard, RRR. No JVD,  Gastrointestinal system: Abdomen is nondistended, soft and nontender. Ileostomy on the right.  Central nervous system: Alert and oriented. No focal neurological deficits. Extremities: Symmetric 5 x 5 power. Skin: left labial fold is swollen and tender Psychiatry: Mood & affect appropriate.    Data Reviewed:  I have personally reviewed following labs and imaging studies   CBC Lab Results  Component Value Date   WBC 12.1 (H) 06/10/2024   RBC 2.75 (L) 06/10/2024   HGB 8.7 (L) 06/10/2024   HCT 26.5 (L) 06/10/2024   MCV 96.4 06/10/2024   MCH 31.6 06/10/2024   PLT 338 06/10/2024   MCHC 32.8 06/10/2024   RDW 16.1 (H) 06/10/2024   LYMPHSABS 2.2 06/10/2024   MONOABS 0.9 06/10/2024   EOSABS 0.6 (H) 06/10/2024   BASOSABS 0.1 06/10/2024     Last metabolic panel Lab Results  Component Value Date   NA 140 06/10/2024   K 2.4 (LL) 06/10/2024   CL 104 06/10/2024   CO2 22 06/10/2024   BUN 33 (H) 06/10/2024   CREATININE 4.30 (H) 06/10/2024   GLUCOSE 147 (H) 06/10/2024   GFRNONAA 11 (L) 06/10/2024   GFRAA 24 (L) 08/16/2018   CALCIUM  7.8 (L) 06/10/2024   PHOS 5.2 (H) 06/08/2024   PROT 7.6 06/07/2024   ALBUMIN 3.8 06/07/2024   BILITOT 0.4 06/07/2024   ALKPHOS 174 (H) 06/07/2024   AST 24 06/07/2024   ALT 22 06/07/2024   ANIONGAP 14 06/10/2024    CBG (last 3)  No results for input(s): GLUCAP in the last 72 hours.    Coagulation Profile: Recent Labs  Lab 06/07/24 1853  INR 1.2     Radiology Studies: CT ABDOMEN PELVIS WO CONTRAST Result Date: 06/10/2024 CLINICAL DATA:  Lower abdominal and pelvic pain. EXAM: CT ABDOMEN AND PELVIS WITHOUT CONTRAST TECHNIQUE: Multidetector CT imaging of the abdomen and pelvis was performed following the standard protocol without IV contrast. RADIATION DOSE REDUCTION:  This exam was performed according to the departmental dose-optimization program which includes automated exposure control, adjustment of the mA and/or kV according to patient size and/or use of iterative reconstruction technique. COMPARISON:  05/28/2023 FINDINGS: Lower chest: Stable emphysematous lung disease. Hepatobiliary: No focal liver abnormality is seen. Status post cholecystectomy. No biliary dilatation. Pancreas: Unremarkable. No pancreatic ductal dilatation or surrounding inflammatory changes. Spleen: Normal in size without focal abnormality. Adrenals/Urinary Tract: No adrenal masses. Atrophic kidneys without hydronephrosis, visible lesion or calculi. The bladder is unremarkable. Stomach/Bowel: Stable appearance status post prior colectomy and ileostomy with stable appearance the right lower quadrant ileostomy. No bowel obstruction, ileus, free air or inflammatory process identified. Stable small bowel anastomosis in the anterior pelvis without evidence of anastomotic leak or abscess. Vascular/Lymphatic: Mild atherosclerosis of the abdominal aorta without aneurysm. No lymphadenopathy identified. Reproductive: Status post hysterectomy. No adnexal masses. Other: No ascites. Inflammation in the subcutaneous fat of the region of the left mons pubis extending into the left upper labial region. Findings are consistent with cellulitis and not associated with focal abscess or subcutaneous gas. Musculoskeletal: Degenerative disc disease of the lumbar spine with associated mild rightward convex scoliosis. IMPRESSION: 1. Inflammation in the subcutaneous fat of the region of the left mons pubis extending into the left upper labial region. Findings are consistent with cellulitis and not associated with focal abscess or subcutaneous gas. 2. Stable appearance status post prior colectomy and ileostomy with stable appearance of the right lower quadrant ileostomy. No bowel obstruction, ileus, free air or inflammatory process  identified. 3. Stable emphysematous lung disease. Electronically Signed   By: Marcey Moan M.D.   On: 06/10/2024 11:38   ECHOCARDIOGRAM COMPLETE Result Date: 06/08/2024    ECHOCARDIOGRAM REPORT   Patient Name:   [facility] CHIVONNE RASCON Date of Exam: 06/08/2024 Medical Rec #:  [id number]       Height:       63.0 in Accession #:    [id number]      Weight:       171.0 lb Date of Birth:  1956/06/27       BSA:          1.809 m Patient Age:    68 years        BP:           139/71 mmHg Patient Gender: F               HR:           101 bpm. Exam Location:  Inpatient Procedure: 2D Echo, Cardiac Doppler and Color Doppler (Both Spectral and Color            Flow Doppler were utilized during procedure). Indications:    Elevated troponin R79.89  History:        Patient has no prior history of Echocardiogram examinations.                 Signs/Symptoms:Shortness of Breath; Risk Factors:Hypertension.                 H/O Pulmonary nodule, Chronic kidney disease, Weakness,                 Hyperlipidemia.  Sonographer:    BERNARDA ROCKS Referring Phys: [id number] CAROLE N HALL IMPRESSIONS  1. Left ventricular ejection fraction, by estimation, is 70 to 75%. The left ventricle has hyperdynamic function. The left ventricle has no regional wall motion abnormalities. Indeterminate diastolic filling due to E-A fusion.  2. Right ventricular systolic function is normal. The right ventricular size is normal.  3. The mitral valve is normal in structure. Mild mitral valve regurgitation.  4. The aortic valve is tricuspid. Aortic valve regurgitation is mild.  5. The inferior vena cava is normal in size with greater than 50% respiratory variability, suggesting right atrial pressure of 3 mmHg. FINDINGS  Left Ventricle: Left ventricular ejection fraction, by estimation, is 70 to 75%. The left ventricle has hyperdynamic function. The left ventricle has no regional wall motion abnormalities. The left ventricular internal cavity size was normal in size. There  is no left ventricular hypertrophy. Indeterminate diastolic filling due to E-A fusion. Right Ventricle: The right ventricular size is normal. Right vetricular wall thickness was not assessed. Right ventricular systolic function is normal. Left Atrium: Left atrial size was normal in size. Right Atrium: Right atrial size was normal in size. Pericardium: There is no evidence of pericardial effusion. Mitral Valve: The mitral valve is normal in structure. Mild mitral valve regurgitation. MV peak gradient, 12.2 mmHg. The mean mitral valve gradient is 6.0 mmHg. Tricuspid Valve: The tricuspid valve is normal in structure. Tricuspid valve regurgitation is trivial. Aortic Valve: The aortic valve is tricuspid. Aortic valve regurgitation is mild. Aortic regurgitation PHT measures 357 msec. Aortic valve mean gradient measures 7.0 mmHg. Aortic valve peak gradient measures 12.5 mmHg. Aortic valve area, by VTI measures 2.62 cm. Pulmonic Valve: The pulmonic valve was normal in structure. Pulmonic valve regurgitation is not visualized. Aorta: The aortic root and ascending aorta are structurally normal, with  no evidence of dilitation. Venous: The inferior vena cava is normal in size with greater than 50% respiratory variability, suggesting right atrial pressure of 3 mmHg. IAS/Shunts: No atrial level shunt detected by color flow Doppler.  LEFT VENTRICLE PLAX 2D LVIDd:         4.40 cm      Diastology LVIDs:         2.50 cm      LV e' medial:    8.89 cm/s LV PW:         0.90 cm      LV E/e' medial:  13.7 LV IVS:        0.80 cm      LV e' lateral:   14.70 cm/s LVOT diam:     1.80 cm      LV E/e' lateral: 8.3 LV SV:         90 LV SV Index:   50 LVOT Area:     2.54 cm  LV Volumes (MOD) LV vol d, MOD A2C: 106.0 ml LV vol d, MOD A4C: 117.0 ml LV vol s, MOD A2C: 33.0 ml LV vol s, MOD A4C: 40.6 ml LV SV MOD A2C:     73.0 ml LV SV MOD A4C:     117.0 ml LV SV MOD BP:      75.2 ml RIGHT VENTRICLE             IVC RV Basal diam:  2.60 cm     IVC  diam: 1.40 cm RV S prime:     21.10 cm/s TAPSE (M-mode): 2.3 cm RVSP:           25.7 mmHg LEFT ATRIUM             Index        RIGHT ATRIUM           Index LA diam:        2.80 cm 1.55 cm/m   RA Pressure: 3.00 mmHg LA Vol (A2C):   45.3 ml 25.04 ml/m  RA Area:     10.30 cm LA Vol (A4C):   39.8 ml 22.00 ml/m  RA Volume:   21.80 ml  12.05 ml/m LA Biplane Vol: 45.8 ml 25.32 ml/m  AORTIC VALVE                     PULMONIC VALVE AV Area (Vmax):    2.62 cm      PV Vmax:          1.50 m/s AV Area (Vmean):   2.63 cm      PV Peak grad:     9.0 mmHg AV Area (VTI):     2.62 cm      PR End Diast Vel: 3.01 msec AV Vmax:           177.00 cm/s AV Vmean:          119.000 cm/s AV VTI:            0.345 m AV Peak Grad:      12.5 mmHg AV Mean Grad:      7.0 mmHg LVOT Vmax:         182.00 cm/s LVOT Vmean:        123.000 cm/s LVOT VTI:          0.355 m LVOT/AV VTI ratio: 1.03 AI PHT:            357 msec  AORTA Ao Root diam:  2.80 cm Ao Asc diam:  3.20 cm MITRAL VALVE                TRICUSPID VALVE MV Area (PHT): 9.03 cm     TR Peak grad:   22.7 mmHg MV Area VTI:   3.05 cm     TR Vmax:        238.00 cm/s MV Peak grad:  12.2 mmHg    Estimated RAP:  3.00 mmHg MV Mean grad:  6.0 mmHg     RVSP:           25.7 mmHg MV Vmax:       1.75 m/s MV Vmean:      112.0 cm/s   SHUNTS MV Decel Time: 84 msec      Systemic VTI:  0.36 m MR Peak grad: 126.3 mmHg    Systemic Diam: 1.80 cm MR Vmax:      562.00 cm/s MV E velocity: 122.00 cm/s MV A velocity: 156.00 cm/s MV E/A ratio:  0.78 Vina Gull MD Electronically signed by Vina Gull MD Signature Date/Time: 06/08/2024/5:13:36 PM    Final        Elgie Butter M.D. [facility] 06/10/2024, 1:05 PM  Available via Epic secure chat 7am-7pm After 7 pm, please refer to night coverage provider listed on amion.

## 2024-06-10 NOTE — Progress Notes (Signed)
   06/10/24 0138  Provider Notification  Provider Name/Title Dr. Alfornia  Date Provider Notified 06/10/24  Time Provider Notified [phone]  Method of Notification Page  Notification Reason Critical Result  Test performed and critical result K+ 2.4  Date Critical Result Received 06/10/24  Provider response See new orders  Date of Provider Response 06/10/24   Provider notified of 2.4 K+ level.  New order received and implemented.  Suzen Ice RN

## 2024-06-11 ENCOUNTER — Ambulatory Visit

## 2024-06-11 LAB — CBC WITH DIFFERENTIAL/PLATELET
Abs Immature Granulocytes: 0.14 K/uL — ABNORMAL HIGH (ref 0.00–0.07)
Basophils Absolute: 0.1 K/uL (ref 0.0–0.1)
Basophils Relative: 0 %
Eosinophils Absolute: 0.5 K/uL (ref 0.0–0.5)
Eosinophils Relative: 4 %
HCT: 27.3 % — ABNORMAL LOW (ref 36.0–46.0)
Hemoglobin: 9 g/dL — ABNORMAL LOW (ref 12.0–15.0)
Immature Granulocytes: 1 %
Lymphocytes Relative: 17 %
Lymphs Abs: 2.2 K/uL (ref 0.7–4.0)
MCH: 32.3 pg (ref 26.0–34.0)
MCHC: 33 g/dL (ref 30.0–36.0)
MCV: 97.8 fL (ref 80.0–100.0)
Monocytes Absolute: 1 K/uL (ref 0.1–1.0)
Monocytes Relative: 8 %
Neutro Abs: 9 K/uL — ABNORMAL HIGH (ref 1.7–7.7)
Neutrophils Relative %: 70 %
Platelets: 327 K/uL (ref 150–400)
RBC: 2.79 MIL/uL — ABNORMAL LOW (ref 3.87–5.11)
RDW: 16.2 % — ABNORMAL HIGH (ref 11.5–15.5)
WBC: 13 K/uL — ABNORMAL HIGH (ref 4.0–10.5)
nRBC: 0.4 % — ABNORMAL HIGH (ref 0.0–0.2)

## 2024-06-11 LAB — BASIC METABOLIC PANEL WITH GFR
Anion gap: 11 (ref 5–15)
BUN: 23 mg/dL (ref 8–23)
CO2: 28 mmol/L (ref 22–32)
Calcium: 7.2 mg/dL — ABNORMAL LOW (ref 8.9–10.3)
Chloride: 96 mmol/L — ABNORMAL LOW (ref 98–111)
Creatinine, Ser: 3.71 mg/dL — ABNORMAL HIGH (ref 0.44–1.00)
GFR, Estimated: 13 mL/min — ABNORMAL LOW (ref >60)
Glucose, Bld: 153 mg/dL — ABNORMAL HIGH (ref 70–99)
Potassium: 2.4 mmol/L — CL (ref 3.5–5.1)
Sodium: 135 mmol/L (ref 135–145)

## 2024-06-11 LAB — MAGNESIUM: Magnesium: 1 mg/dL — ABNORMAL LOW (ref 1.7–2.4)

## 2024-06-11 MED ORDER — CHLORHEXIDINE GLUCONATE CLOTH 2 % EX PADS
6.0000 | MEDICATED_PAD | Freq: Every day | CUTANEOUS | Status: DC
Start: 2024-06-11 — End: 2024-06-11

## 2024-06-11 MED ORDER — DOXYCYCLINE HYCLATE 100 MG PO TABS
100.0000 mg | ORAL_TABLET | Freq: Two times a day (BID) | ORAL | Status: DC
Start: 2024-06-11 — End: 2024-06-16
  Administered 2024-06-11 – 2024-06-14 (×7): 100 mg via ORAL
  Filled 2024-06-11 (×7): qty 1

## 2024-06-11 MED ORDER — SODIUM CHLORIDE 0.9 % IV BOLUS
1000.0000 mL | Freq: Once | INTRAVENOUS | Status: DC
  Filled 2024-06-11: qty 1000

## 2024-06-11 MED ORDER — POTASSIUM CHLORIDE 10 MEQ/100ML IV SOLN
10.0000 meq | INTRAVENOUS | Status: AC
  Administered 2024-06-11 (×3): 10 meq via INTRAVENOUS
  Filled 2024-06-11 (×3): qty 100

## 2024-06-11 MED ORDER — POTASSIUM CHLORIDE 20 MEQ PO PACK
40.0000 meq | PACK | ORAL | Status: AC
  Administered 2024-06-11 (×2): 40 meq via ORAL
  Filled 2024-06-11 (×2): qty 2

## 2024-06-11 MED ORDER — HEPARIN SOD (PORK) LOCK FLUSH 100 UNIT/ML IV SOLN: 250.0000 [IU] | Freq: Once | INTRAVENOUS | Status: DC | PRN

## 2024-06-11 MED ORDER — MUPIROCIN 2 % EX OINT
1.0000 | TOPICAL_OINTMENT | Freq: Two times a day (BID) | CUTANEOUS | Status: DC
  Administered 2024-06-11 – 2024-06-14 (×6): 1 via NASAL
  Filled 2024-06-11 (×2): qty 22

## 2024-06-11 MED ORDER — MAGNESIUM SULFATE 2 GM/50ML IV SOLN
2.0000 g | Freq: Once | INTRAVENOUS | Status: AC
  Administered 2024-06-11: 2 g via INTRAVENOUS
  Filled 2024-06-11: qty 50

## 2024-06-11 MED ORDER — HEPARIN SOD (PORK) LOCK FLUSH 100 UNIT/ML IV SOLN: 500.0000 [IU] | Freq: Once | INTRAVENOUS | Status: DC | PRN

## 2024-06-11 MED ORDER — HYDRALAZINE HCL 50 MG PO TABS
50.0000 mg | ORAL_TABLET | Freq: Three times a day (TID) | ORAL | Status: DC
Start: 2024-06-11 — End: 2024-06-14
  Administered 2024-06-11 – 2024-06-14 (×6): 50 mg via ORAL
  Filled 2024-06-11 (×7): qty 1

## 2024-06-11 MED ORDER — SODIUM CHLORIDE 0.9 % IV SOLN
INTRAVENOUS | Status: AC
Start: 2024-06-11 — End: 2024-06-12

## 2024-06-11 MED ORDER — CHLORHEXIDINE GLUCONATE CLOTH 2 % EX PADS
6.0000 | MEDICATED_PAD | Freq: Every day | CUTANEOUS | Status: DC
  Administered 2024-06-11: 6 via TOPICAL

## 2024-06-11 NOTE — Plan of Care (Signed)
  Problem: Health Behavior/Discharge Planning: Goal: Ability to manage health-related needs will improve Outcome: Progressing   Problem: Clinical Measurements: Goal: Ability to maintain clinical measurements within normal limits will improve Outcome: Progressing Goal: Will remain free from infection Outcome: Progressing Goal: Diagnostic test results will improve Outcome: Progressing Goal: Respiratory complications will improve Outcome: Progressing Goal: Cardiovascular complication will be avoided Outcome: Progressing   Problem: Activity: Goal: Risk for activity intolerance will decrease Outcome: Progressing   Problem: Nutrition: Goal: Adequate nutrition will be maintained Outcome: Progressing   Problem: Coping: Goal: Level of anxiety will decrease Outcome: Progressing   Problem: Elimination: Goal: Will not experience complications related to bowel motility Outcome: Progressing Goal: Will not experience complications related to urinary retention Outcome: Progressing   Problem: Pain Managment: Goal: General experience of comfort will improve and/or be controlled Outcome: Progressing   Problem: Safety: Goal: Ability to remain free from injury will improve Outcome: Progressing   Problem: Skin Integrity: Goal: Risk for impaired skin integrity will decrease Outcome: Progressing   Problem: Education: Goal: Knowledge of disease and its progression will improve Outcome: Progressing Goal: Individualized Educational Video(s) Outcome: Progressing   Problem: Fluid Volume: Goal: Compliance with measures to maintain balanced fluid volume will improve Outcome: Progressing   Problem: Health Behavior/Discharge Planning: Goal: Ability to manage health-related needs will improve Outcome: Progressing   Problem: Nutritional: Goal: Ability to make healthy dietary choices will improve Outcome: Progressing   Problem: Clinical Measurements: Goal: Complications related to the  disease process, condition or treatment will be avoided or minimized Outcome: Progressing

## 2024-06-11 NOTE — Progress Notes (Signed)
   06/11/24 9355  Provider Notification  Provider Name/Title Dr. Franky  Date Provider Notified 06/10/24  Time Provider Notified [phone]  Method of Notification Page  Notification Reason Critical Result  Test performed and critical result K+2.4  Date Critical Result Received 06/10/24  Provider response See new orders  Date of Provider Response 06/11/24  Time of Provider Response 0649   Suzen Ice RN

## 2024-06-11 NOTE — Consult Note (Addendum)
 WOC Nurse ostomy follow up Refer to earlier consult note on 10/4 for wound care.  Pt seen in person to assess for ostomy needs.  She has had an ostomy for many years and states she is independent with pouch application and emptying prior to admission. She uses one piece precut to 1 1/2 inches convex pouches and a barrier ring.  I informed her we do not have precut pouches, but I will order 3 sets of a flexible convex pouch and barrier rings to have at the bedside for staff nurses' and patient's use. Use supplies: Convex pouch Lawson # [id number], and barrier ring Gerlean # [id number]. Current pouch is intact with good seal, Pt states it was applied yesterday.  Mod amt semiformed stool in the pouch, unable to visualize stoma.  Pt denies need for further assistance.  Please re-consult if further assistance is needed.  Thank-you,  Stephane Fought MSN, RN, CWOCN, CWCN-AP, CNS Contact Mon-Fri 0700-1500: [phone]

## 2024-06-11 NOTE — Anesthesia Preprocedure Evaluation (Signed)
 Anesthesia Evaluation  Patient identified by MRN, date of birth, ID band Patient awake    Reviewed: Allergy & Precautions, NPO status , Patient's Chart, lab work & pertinent test results, reviewed documented beta blocker date and time   History of Anesthesia Complications Negative for: history of anesthetic complications  Airway Mallampati: II  TM Distance: >3 FB Neck ROM: Full    Dental  (+) Edentulous Lower, Edentulous Upper   Pulmonary asthma , former smoker   Pulmonary exam normal        Cardiovascular hypertension, Pt. on medications and Pt. on home beta blockers Normal cardiovascular exam   '25 TTE - EF 70 to 75%. Indeterminate diastolic filling due to E-A fusion. Mild MR and AI.    Neuro/Psych  PSYCHIATRIC DISORDERS Anxiety Depression     Hearing loss Visual disturbance     GI/Hepatic Neg liver ROS, PUD,GERD  Controlled and Medicated,,  Endo/Other   K 3.4 Ca 7.4   Renal/GU ESRFRenal disease     Musculoskeletal  (+) Arthritis ,    Abdominal   Peds  Hematology  (+) Blood dyscrasia, anemia   Anesthesia Other Findings HSV  Reproductive/Obstetrics                              Anesthesia Physical Anesthesia Plan  ASA: 4  Anesthesia Plan: General   Post-op Pain Management: Tylenol  PO (pre-op )*   Induction: Intravenous  PONV Risk Score and Plan: 3 and Treatment may vary due to age or medical condition, Ondansetron  and Dexamethasone   Airway Management Planned: LMA  Additional Equipment: None  Intra-op Plan:   Post-operative Plan: Extubation in OR  Informed Consent: I have reviewed the patients History and Physical, chart, labs and discussed the procedure including the risks, benefits and alternatives for the proposed anesthesia with the patient or authorized representative who has indicated his/her understanding and acceptance.     Dental advisory given  Plan  Discussed with: CRNA and Anesthesiologist  Anesthesia Plan Comments:          Anesthesia Quick Evaluation

## 2024-06-11 NOTE — Progress Notes (Signed)
  Progress Note    06/11/2024 7:05 AM Hospital Day 4  Subjective:  no complaints  afebrile  Vitals:   06/10/24 2023 06/11/24 0524  BP: (!) 155/89 (!) 152/84  Pulse: 100 86  Resp: 17 18  Temp: 98.8 F (37.1 C) 98.7 F (37.1 C)  SpO2: 95% 95%    Physical Exam: General:  resting comfortably Lungs:  non labored   CBC    Component Value Date/Time   WBC 13.0 (H) 06/11/2024 0444   RBC 2.79 (L) 06/11/2024 0444   HGB 9.0 (L) 06/11/2024 0444   HGB 10.8 (L) 05/13/2023 1453   HGB 11.2 (L) 09/08/2017 0923   HCT 27.3 (L) 06/11/2024 0444   HCT 33.1 (L) 09/08/2017 0923   PLT 327 06/11/2024 0444   PLT 332 05/13/2023 1453   PLT 425 (H) 09/08/2017 0923   PLT 297 01/14/2017 1009   MCV 97.8 06/11/2024 0444   MCV 103 (H) 09/08/2017 0923   MCH 32.3 06/11/2024 0444   MCHC 33.0 06/11/2024 0444   RDW 16.2 (H) 06/11/2024 0444   RDW 13.6 09/08/2017 0923   LYMPHSABS 2.2 06/11/2024 0444   LYMPHSABS 2.7 09/08/2017 0923   MONOABS 1.0 06/11/2024 0444   EOSABS 0.5 06/11/2024 0444   EOSABS 0.3 09/08/2017 0923   BASOSABS 0.1 06/11/2024 0444   BASOSABS 0.0 09/08/2017 0923    BMET    Component Value Date/Time   NA 135 06/11/2024 0444   NA 142 09/08/2017 0923   K 2.4 (LL) 06/11/2024 0444   K 3.7 09/08/2017 0923   CL 96 (L) 06/11/2024 0444   CL 97 (L) 09/08/2017 0923   CO2 28 06/11/2024 0444   CO2 28 09/08/2017 0923   GLUCOSE 153 (H) 06/11/2024 0444   GLUCOSE 107 09/08/2017 0923   BUN 23 06/11/2024 0444   BUN 49 (H) 09/08/2017 0923   CREATININE 3.71 (H) 06/11/2024 0444   CREATININE 3.34 (H) 05/13/2023 1453   CREATININE 3.1 (HH) 09/08/2017 0923   CALCIUM  7.2 (L) 06/11/2024 0444   CALCIUM  9.9 09/08/2017 0923   CALCIUM  8.9 04/12/2013 0851   GFRNONAA 13 (L) 06/11/2024 0444   GFRNONAA 14 (L) 05/13/2023 1453   GFRAA 24 (L) 08/16/2018 0745    INR    Component Value Date/Time   INR 1.2 06/07/2024 1853     Intake/Output Summary (Last 24 hours) at 06/11/2024 0705 Last data  filed at 06/11/2024 0644 Gross per 24 hour  Intake 4606.33 ml  Output 3275 ml  Net 1331.33 ml     Assessment/Plan:  68 y.o. female with progression to ESRD now needing dialysis access.   Hospital Day 4  -pt is left handed and will plan for right arm access and conversion of temp cath to tunneled catheter 06/12/2024 -she does have a leukocytosis of 13k but this is down from 19.8k on 06/07/2024 and she is afebrile.    Lucie Apt, PA-C [facility] [phone] 06/11/2024 7:05 AM

## 2024-06-11 NOTE — Progress Notes (Signed)
 [facility]                                                                               [name], is a 68 y.o. female, DOB - 1956/08/05, FMW:[id number] Admit date - 06/07/2024    Outpatient Primary MD for the patient is [name], [name], DO  LOS - 4  days    Brief summary   68 y.o. 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  She is being treated for LLL pneumonia and advanced CKD , possible HD catheter placement and start her on HD this admission.    Assessment & Plan    Assessment and Plan:   ESRD with worsening gap and metabolic acidosis:  Nephrology on board and to assist with initiating HD.  Vascular surgery consulted and plan for HD access placement Monday or Tuesday.  Continue with IV bicarb for metabolic acidosis.    LLL pneumonia Started her on IV antibiotics.  Continue the same.  Procalcitonin is 1.  Improving leukocytosis. She remains afebrile.    Uncontrolled hypertension/ Hypertensive urgency:  Sub optimal. Currently on norvasc  10 mg daily and hydralazine 50 mg TID.    Anemia sec to CKD - anemia panel showing low iron .  - hemoglobin around 7.  - stool for occult blood ordered.  - 1 unit of prbc transfusion ordered as her hemoglobin is 7 this morning.    Possible UTI;  UA shows large leukocytes with many bacteria. Urine cultures showing insignificant growth.  Blood cultures are pending and negative.  Continue with IV ceftriaxone .     Dyspnea : Appears to have improved.  Rule out CHF.  BNP is 1769.  Echo shows LVEF of 70 to 75% , hyperdynamic  function. Indeterminate diastolic filling  due to E-A fusion.  Troponins are mildly elevated possibly from demand ischemia from ESRD, metabolic acidosis, and pneumonia.     Short gut syndrome/ Crohn's disease:  S/p total colectomy, with right ileostomy.     Painful lesions on the labia suspicious for genital herpes  Right external labia is swollen, indurated and tender.  CT abd and pelvis show Inflammation in the subcutaneous fat of the region of the left mons pubis extending into the left upper labial region. Findings are consistent with cellulitis and not associated with focal abscess or subcutaneous gas. Patient on valacyclovir ,. Received a dose of diflucan  and is on doxycycline  for possible staph infection.    Gout:  Resume allopurinol   Body mass index is 31.83 kg/m. Obesity   Hypokalemia and hypomagnesemia Replaced. Repeat levels tonight.  ? High output from ileostomy.     Estimated body mass index is 31.83 kg/m as calculated from the following:   Height as of this encounter: 5' 3 (1.6 m).   Weight as of this encounter: 81.5 kg.  Code Status: full code DVT Prophylaxis:  heparin  injection 5,000 Units Start: 06/07/24 2230   Level of Care: Level of care: Telemetry Medical Family Communication: family at bedside.   Disposition Plan:     Remains inpatient appropriate:  further work up.   Procedures:  Echocardiogram.   Consultants:   Nephrology  Vascular surgery.   Antimicrobials:   Anti-infectives (From admission, onward)    Start     Dose/Rate Route Frequency Ordered Stop   06/11/24 1015  doxycycline  (VIBRA -TABS) tablet 100 mg        100 mg Oral Every 12 hours 06/11/24 0929 06/16/24 0959   06/09/24 1730  fluconazole  (DIFLUCAN ) tablet 200 mg        200 mg Oral  Once 06/09/24 1632 06/09/24 1900   06/08/24 2200  cefTRIAXone  (ROCEPHIN ) 1 g in sodium chloride  0.9 % 100 mL IVPB        1 g 200 mL/hr over 30 Minutes Intravenous Every 24 hours 06/07/24 2223  06/13/24 2159   06/08/24 1000  azithromycin (ZITHROMAX) tablet 500 mg  Status:  Discontinued        500 mg Oral Daily 06/07/24 2223 06/11/24 0929   06/08/24 1000  valACYclovir  (VALTREX ) tablet 500 mg        500 mg Oral Daily 06/07/24 2307 06/17/24 0959   06/07/24 2245  valACYclovir  (VALTREX ) tablet 500 mg  Status:  Discontinued        500 mg Oral Daily 06/07/24 2232 06/07/24 2307   06/07/24 2115  cefTRIAXone  (ROCEPHIN ) 1 g in sodium chloride  0.9 % 100 mL IVPB        1 g 200 mL/hr over 30 Minutes Intravenous  Once 06/07/24 2107 06/07/24 2255   06/07/24 2115  azithromycin (ZITHROMAX) tablet 500 mg        500 mg Oral  Once 06/07/24 2108 06/07/24 2203        Medications  Scheduled Meds:  sodium chloride    Intravenous Once   allopurinol   150 mg Oral Q2000   amLODipine   10 mg Oral Daily   calcitRIOL   0.25 mcg Oral Daily   Chlorhexidine  Gluconate Cloth  6 each Topical Daily   darbepoetin (ARANESP) injection - DIALYSIS  60 mcg Subcutaneous Q Sat-1800   doxycycline   100 mg Oral Q12H   DULoxetine   60 mg Oral Daily   heparin   5,000 Units Subcutaneous Q8H   hydrALAZINE  25 mg Oral Q8H   Influenza vac split trivalent PF  0.5 mL Intramuscular Tomorrow-1000   metoprolol  succinate  100 mg Oral QHS   mupirocin ointment  1 Application Nasal BID   valACYclovir   500 mg Oral Daily   Continuous Infusions:  sodium chloride  75 mL/hr at 06/11/24 1038   cefTRIAXone  (ROCEPHIN )  IV 1 g (06/10/24 2245)   iron  sucrose 200 mg (06/11/24 1206)   PRN Meds:.acetaminophen , guaiFENesin-dextromethorphan, hydrALAZINE, ipratropium-albuterol , melatonin, oxyCODONE , polyethylene glycol, prochlorperazine , sodium chloride  flush    Subjective:   Muna Demers was seen and examined today.  Pain has improved. No abdomina pain. No nausea or vomiting.   Objective:   Vitals:   06/10/24 2023 06/11/24 0524 06/11/24 0701 06/11/24 0817  BP: (!) 155/89 (!) 152/84  (!) 163/83  Pulse: 100 86  85  Resp: 17 18  18   Temp:  98.8 F (37.1 C) 98.7 F (37.1 C)  98.1 F (36.7 C)  TempSrc: Oral Oral  Oral  SpO2: 95% 95%  94%  Weight:   81.5 kg   Height:        Intake/Output Summary (  Last 24 hours) at 06/11/2024 1639 Last data filed at 06/11/2024 1328 Gross per 24 hour  Intake 3706.33 ml  Output 3225 ml  Net 481.33 ml   Filed Weights   06/08/24 1700 06/10/24 0719 06/11/24 0701  Weight: 81.2 kg 81.5 kg 81.5 kg    General exam: Appears calm and comfortable  Respiratory system: Clear to auscultation. Respiratory effort normal. Cardiovascular system: S1 & S2 heard, RRR. Gastrointestinal system: Abdomen is nondistended, soft and nontender. S/p right sided ileostomy,  Central nervous system: Alert and oriented. No focal neurological deficits. Extremities: Symmetric 5 x 5 power. Skin: No rashes,  Psychiatry:  Mood & affect appropriate.     Data Reviewed:  I have personally reviewed following labs and imaging studies   CBC Lab Results  Component Value Date   WBC 13.0 (H) 06/11/2024   RBC 2.79 (L) 06/11/2024   HGB 9.0 (L) 06/11/2024   HCT 27.3 (L) 06/11/2024   MCV 97.8 06/11/2024   MCH 32.3 06/11/2024   PLT 327 06/11/2024   MCHC 33.0 06/11/2024   RDW 16.2 (H) 06/11/2024   LYMPHSABS 2.2 06/11/2024   MONOABS 1.0 06/11/2024   EOSABS 0.5 06/11/2024   BASOSABS 0.1 06/11/2024     Last metabolic panel Lab Results  Component Value Date   NA 135 06/11/2024   K 2.4 (LL) 06/11/2024   CL 96 (L) 06/11/2024   CO2 28 06/11/2024   BUN 23 06/11/2024   CREATININE 3.71 (H) 06/11/2024   GLUCOSE 153 (H) 06/11/2024   GFRNONAA 13 (L) 06/11/2024   GFRAA 24 (L) 08/16/2018   CALCIUM  7.2 (L) 06/11/2024   PHOS 5.2 (H) 06/08/2024   PROT 6.7 06/09/2024   ALBUMIN 2.5 (L) 06/09/2024   BILITOT 0.7 06/09/2024   ALKPHOS 122 06/09/2024   AST 25 06/09/2024   ALT 22 06/09/2024   ANIONGAP 11 06/11/2024    CBG (last 3)  No results for input(s): GLUCAP in the last 72 hours.    Coagulation Profile: Recent Labs   Lab 06/07/24 1853  INR 1.2     Radiology Studies: CT ABDOMEN PELVIS WO CONTRAST Result Date: 06/10/2024 CLINICAL DATA:  Lower abdominal and pelvic pain. EXAM: CT ABDOMEN AND PELVIS WITHOUT CONTRAST TECHNIQUE: Multidetector CT imaging of the abdomen and pelvis was performed following the standard protocol without IV contrast. RADIATION DOSE REDUCTION: This exam was performed according to the departmental dose-optimization program which includes automated exposure control, adjustment of the mA and/or kV according to patient size and/or use of iterative reconstruction technique. COMPARISON:  05/28/2023 FINDINGS: Lower chest: Stable emphysematous lung disease. Hepatobiliary: No focal liver abnormality is seen. Status post cholecystectomy. No biliary dilatation. Pancreas: Unremarkable. No pancreatic ductal dilatation or surrounding inflammatory changes. Spleen: Normal in size without focal abnormality. Adrenals/Urinary Tract: No adrenal masses. Atrophic kidneys without hydronephrosis, visible lesion or calculi. The bladder is unremarkable. Stomach/Bowel: Stable appearance status post prior colectomy and ileostomy with stable appearance the right lower quadrant ileostomy. No bowel obstruction, ileus, free air or inflammatory process identified. Stable small bowel anastomosis in the anterior pelvis without evidence of anastomotic leak or abscess. Vascular/Lymphatic: Mild atherosclerosis of the abdominal aorta without aneurysm. No lymphadenopathy identified. Reproductive: Status post hysterectomy. No adnexal masses. Other: No ascites. Inflammation in the subcutaneous fat of the region of the left mons pubis extending into the left upper labial region. Findings are consistent with cellulitis and not associated with focal abscess or subcutaneous gas. Musculoskeletal: Degenerative disc disease of the lumbar spine with associated mild rightward convex  scoliosis. IMPRESSION: 1. Inflammation in the subcutaneous fat of  the region of the left mons pubis extending into the left upper labial region. Findings are consistent with cellulitis and not associated with focal abscess or subcutaneous gas. 2. Stable appearance status post prior colectomy and ileostomy with stable appearance of the right lower quadrant ileostomy. No bowel obstruction, ileus, free air or inflammatory process identified. 3. Stable emphysematous lung disease. Electronically Signed   By: Marcey Moan M.D.   On: 06/10/2024 11:38       Elgie Butter M.D. [facility] 06/11/2024, 4:39 PM  Available via Epic secure chat 7am-7pm After 7 pm, please refer to night coverage provider listed on amion.

## 2024-06-11 NOTE — Progress Notes (Addendum)
 Contacted by nephrology regarding CLIP request. Met bedside with pt to discuss out-pt HD preferences. Pt will be staying between her address in [place] and also her mom's on [address] in [place]. Pt requesting the [facility] on [address], [facility]). Pt request a MWF, 1st shift. Pt will be driving herself to these appts. Will now begin clip process and update with any new info.   Lavanda apck Dialysis Navigator [phone]  Addendum 1142am Referral submitted to [facility] admissions for review. Awaiting hep b lab results, will upload when applicable, will continue to assist.

## 2024-06-11 NOTE — Progress Notes (Signed)
  [facility] Progress Note  Subjective:  Seen in room, no c/o's today Seen by VVS, plan is for [facility] and AVG on tuesday  Vitals:   06/10/24 1728 06/10/24 2023 06/11/24 0524 06/11/24 0817  BP: (!) 164/87 (!) 155/89 (!) 152/84 (!) 163/83  Pulse: 99 100 86 85  Resp: 19 17 18 18   Temp: 98.6 F (37 C) 98.8 F (37.1 C) 98.7 F (37.1 C) 98.1 F (36.7 C)  TempSrc: Oral Oral Oral Oral  SpO2: 99% 95% 95% 94%  Weight:      Height:        Exam: Gen alert, no distress Sclera anicteric, throat clear  No jvd or bruits Chest clear bilat to bases RRR no MRG Abd soft ntnd no mass or ascites +bs Ext no LE or UE edema, no other edema Neuro is alert, Ox 3 , nf, mild asterixis   Home bp meds: Toprol  xl 100 every day   Date                 Creat               eGFR (ml/min) 2008- 2016 1.4- 2.5 2017- 2018 2.0- 2.6 2019- 2020 1.9- 2.4 2021                2.10- 4.94 2022- 2023 2.10- 6.61 2024                2.70- 4.00                                             Jan 2025         3.56                 12 ml/min                     Aug 2025         4.97                 8 ml/min                               06/07/24          5.00                 9                                  06/08/24          4.57                 10 ml/min                       Assessment/ Plan: CKD 5: w/ worsening anion gap and metabolic acidosis. Admitted for possible PNA, sepsis. She has advanced CKD 5 f/b Dr Rayburn at [facility] w/ uremic symptoms including fatigue, loss of appetite and more recently confusion. Plan is for HD initiation. Seen by VVS, plan is for [facility] and AVG tuesday. Will start HD thereafter. Alerted SW for CLIP to outpt needs.  Metabolic acidosis: due to CKD 5 and chronic GI losses (see #4). Improved d/c bicarb gtt today. Anemia ckd: tsat 9%,  ferritin 291. IV iron  load ordered as venofer  200mg  daily x 5.  Starting also darbe 60 mcg weekly for now. Short-gut syndrome/ Crohn's disease: hx of  total colectomy, has an ileostomy and has been getting IVFs at a local infusion center 3x per week to prevent dehydration.  Change to NS today 100/hr.  No UF with HD tomorrow, can hopefully just give IVF at HD now. CAP: on IV abx per pmd Possible UTI: on abx per pmd Hypokalemia: being supplemented po and IV - use 4K dialysate tomorrow   Manuelita Barters MD [facility] Pager [phone]   Recent Labs  Lab 06/07/24 1853 06/08/24 0527 06/08/24 1858 06/09/24 0830 06/09/24 2347 06/10/24 0500 06/11/24 0444  HGB 7.9* 7.5*  --  7.0*   < > 8.7* 9.0*  ALBUMIN 3.8  --   --  2.5*  --   --   --   CALCIUM  7.4* 6.7*   < > 8.0*   < > 7.8* 7.2*  PHOS  --  5.2*  --   --   --   --   --   CREATININE 5.00* 4.57*   < > 4.53*   < > 4.30* 3.71*  K 3.4* 3.1*   < > 2.4*   < > 2.4* 2.4*   < > = values in this interval not displayed.   Recent Labs  Lab 06/08/24 1858  IRON  20*  TIBC 232*  FERRITIN 291   Inpatient medications:  sodium chloride    Intravenous Once   allopurinol   150 mg Oral Q2000   amLODipine   10 mg Oral Daily   calcitRIOL   0.25 mcg Oral Daily   Chlorhexidine  Gluconate Cloth  6 each Topical Daily   darbepoetin (ARANESP) injection - DIALYSIS  60 mcg Subcutaneous Q Sat-1800   doxycycline   100 mg Oral Q12H   DULoxetine   60 mg Oral Daily   heparin   5,000 Units Subcutaneous Q8H   hydrALAZINE  25 mg Oral Q8H   Influenza vac split trivalent PF  0.5 mL Intramuscular Tomorrow-1000   metoprolol  succinate  100 mg Oral QHS   mupirocin ointment  1 Application Nasal BID   potassium chloride   40 mEq Oral Q4H   valACYclovir   500 mg Oral Daily    cefTRIAXone  (ROCEPHIN )  IV 1 g (06/10/24 2245)   iron  sucrose Stopped (06/10/24 1648)   potassium chloride      sodium bicarbonate  150 mEq in dextrose  5 % 1,150 mL infusion 100 mL/hr at 06/11/24 0114   acetaminophen , guaiFENesin-dextromethorphan, hydrALAZINE, ipratropium-albuterol , melatonin, oxyCODONE , polyethylene glycol,  prochlorperazine , sodium chloride  flush

## 2024-06-12 ENCOUNTER — Inpatient Hospital Stay (HOSPITAL_COMMUNITY)

## 2024-06-12 ENCOUNTER — Inpatient Hospital Stay (HOSPITAL_COMMUNITY): Admitting: Anesthesiology

## 2024-06-12 ENCOUNTER — Encounter (HOSPITAL_COMMUNITY)
Admission: EM | Disposition: A | Discharge status: Home / Self Care | Payer: Self-pay | Source: Home / Self Care | Attending: Internal Medicine

## 2024-06-12 ENCOUNTER — Encounter (HOSPITAL_COMMUNITY): Payer: Self-pay | Admitting: Internal Medicine

## 2024-06-12 ENCOUNTER — Other Ambulatory Visit: Payer: Self-pay

## 2024-06-12 DIAGNOSIS — I120 Hypertensive chronic kidney disease with stage 5 chronic kidney disease or end stage renal disease: Secondary | ICD-10-CM

## 2024-06-12 DIAGNOSIS — N186 End stage renal disease: Secondary | ICD-10-CM

## 2024-06-12 DIAGNOSIS — F418 Other specified anxiety disorders: Secondary | ICD-10-CM | POA: Diagnosis not present

## 2024-06-12 DIAGNOSIS — Z87891 Personal history of nicotine dependence: Secondary | ICD-10-CM

## 2024-06-12 HISTORY — PX: AV FISTULA PLACEMENT: SHX1204

## 2024-06-12 HISTORY — PX: INSERTION OF DIALYSIS CATHETER: SHX1324

## 2024-06-12 LAB — PHOSPHORUS: Phosphorus: 2.9 mg/dL (ref 2.5–4.6)

## 2024-06-12 LAB — CBC
HCT: 28.6 % — ABNORMAL LOW (ref 36.0–46.0)
Hemoglobin: 9.1 g/dL — ABNORMAL LOW (ref 12.0–15.0)
MCH: 32.5 pg (ref 26.0–34.0)
MCHC: 31.8 g/dL (ref 30.0–36.0)
MCV: 102.1 fL — ABNORMAL HIGH (ref 80.0–100.0)
Platelets: 320 K/uL (ref 150–400)
RBC: 2.8 MIL/uL — ABNORMAL LOW (ref 3.87–5.11)
RDW: 16 % — ABNORMAL HIGH (ref 11.5–15.5)
WBC: 13.4 K/uL — ABNORMAL HIGH (ref 4.0–10.5)
nRBC: 0.4 % — ABNORMAL HIGH (ref 0.0–0.2)

## 2024-06-12 LAB — BASIC METABOLIC PANEL WITH GFR
Anion gap: 13 (ref 5–15)
BUN: 23 mg/dL (ref 8–23)
CO2: 25 mmol/L (ref 22–32)
Calcium: 7.4 mg/dL — ABNORMAL LOW (ref 8.9–10.3)
Chloride: 98 mmol/L (ref 98–111)
Creatinine, Ser: 3.48 mg/dL — ABNORMAL HIGH (ref 0.44–1.00)
GFR, Estimated: 14 mL/min — ABNORMAL LOW (ref >60)
Glucose, Bld: 137 mg/dL — ABNORMAL HIGH (ref 70–99)
Potassium: 3.4 mmol/L — ABNORMAL LOW (ref 3.5–5.1)
Sodium: 136 mmol/L (ref 135–145)

## 2024-06-12 LAB — HEPATITIS B SURFACE ANTIGEN: Hepatitis B Surface Ag: NONREACTIVE

## 2024-06-12 LAB — MAGNESIUM: Magnesium: 1.2 mg/dL — ABNORMAL LOW (ref 1.7–2.4)

## 2024-06-12 SURGERY — INSERTION OF DIALYSIS CATHETER
Anesthesia: General | Laterality: Right

## 2024-06-12 MED ORDER — CHLORHEXIDINE GLUCONATE 0.12 % MT SOLN
OROMUCOSAL | Status: AC
  Administered 2024-06-12: 15 mL via OROMUCOSAL
  Filled 2024-06-12: qty 15

## 2024-06-12 MED ORDER — HEPARIN SODIUM (PORCINE) 5000 UNIT/ML IJ SOLN
5000.0000 [IU] | Freq: Three times a day (TID) | INTRAMUSCULAR | Status: DC
  Administered 2024-06-13 – 2024-06-14 (×3): 5000 [IU] via SUBCUTANEOUS
  Filled 2024-06-12 (×4): qty 1

## 2024-06-12 MED ORDER — ANTICOAGULANT SODIUM CITRATE 4% (200MG/5ML) IV SOLN: 5.0000 mL | Status: DC | PRN

## 2024-06-12 MED ORDER — HEPARIN SODIUM (PORCINE) 1000 UNIT/ML DIALYSIS: 1000.0000 [IU] | INTRAMUSCULAR | Status: DC | PRN

## 2024-06-12 MED ORDER — ORAL CARE MOUTH RINSE: 15.0000 mL | Freq: Once | OROMUCOSAL | Status: AC

## 2024-06-12 MED ORDER — ALTEPLASE 2 MG IJ SOLR: 2.0000 mg | Freq: Once | INTRAMUSCULAR | Status: DC | PRN

## 2024-06-12 MED ORDER — OXYCODONE HCL 5 MG PO TABS: 5.0000 mg | ORAL_TABLET | Freq: Once | ORAL | Status: DC | PRN

## 2024-06-12 MED ORDER — HEPARIN SODIUM (PORCINE) 1000 UNIT/ML IJ SOLN
INTRAMUSCULAR | Status: AC
  Filled 2024-06-12: qty 1

## 2024-06-12 MED ORDER — HEPARIN SODIUM (PORCINE) 1000 UNIT/ML IJ SOLN
INTRAMUSCULAR | Status: DC | PRN
  Administered 2024-06-12: 3.8 [IU]

## 2024-06-12 MED ORDER — PROPOFOL 10 MG/ML IV BOLUS
INTRAVENOUS | Status: AC
  Filled 2024-06-12: qty 20

## 2024-06-12 MED ORDER — LIDOCAINE HCL (PF) 1 % IJ SOLN
INTRAMUSCULAR | Status: AC
  Filled 2024-06-12: qty 30

## 2024-06-12 MED ORDER — PROPOFOL 10 MG/ML IV BOLUS
INTRAVENOUS | Status: DC | PRN
Start: 2024-06-12 — End: 2024-06-12
  Administered 2024-06-12: 100 mg via INTRAVENOUS

## 2024-06-12 MED ORDER — LIDOCAINE HCL (PF) 1 % IJ SOLN: 5.0000 mL | INTRAMUSCULAR | Status: DC | PRN

## 2024-06-12 MED ORDER — MAGNESIUM SULFATE 4 GM/100ML IV SOLN
4.0000 g | Freq: Once | INTRAVENOUS | Status: AC
  Administered 2024-06-12: 4 g via INTRAVENOUS
  Filled 2024-06-12: qty 100

## 2024-06-12 MED ORDER — FENTANYL CITRATE (PF) 250 MCG/5ML IJ SOLN
INTRAMUSCULAR | Status: AC
  Filled 2024-06-12: qty 5

## 2024-06-12 MED ORDER — OXYCODONE HCL 5 MG/5ML PO SOLN: 5.0000 mg | Freq: Once | ORAL | Status: DC | PRN

## 2024-06-12 MED ORDER — DEXAMETHASONE SODIUM PHOSPHATE 10 MG/ML IJ SOLN
INTRAMUSCULAR | Status: DC | PRN
  Administered 2024-06-12: 5 mg via INTRAVENOUS

## 2024-06-12 MED ORDER — SODIUM CHLORIDE 0.9 % IV SOLN: INTRAVENOUS | Status: DC | PRN

## 2024-06-12 MED ORDER — FENTANYL CITRATE (PF) 250 MCG/5ML IJ SOLN
INTRAMUSCULAR | Status: DC | PRN
  Administered 2024-06-12 (×3): 50 ug via INTRAVENOUS

## 2024-06-12 MED ORDER — LIDOCAINE-PRILOCAINE 2.5-2.5 % EX CREA: 1.0000 | TOPICAL_CREAM | CUTANEOUS | Status: DC | PRN

## 2024-06-12 MED ORDER — HEPARIN SODIUM (PORCINE) 1000 UNIT/ML IJ SOLN
INTRAMUSCULAR | Status: AC
Start: 2024-06-12 — End: 2024-06-12
  Filled 2024-06-12: qty 10

## 2024-06-12 MED ORDER — LACTATED RINGERS IV SOLN: INTRAVENOUS | Status: DC

## 2024-06-12 MED ORDER — ONDANSETRON HCL 4 MG/2ML IJ SOLN
INTRAMUSCULAR | Status: DC | PRN
Start: 2024-06-12 — End: 2024-06-12
  Administered 2024-06-12: 4 mg via INTRAVENOUS

## 2024-06-12 MED ORDER — HEPARIN 6000 UNIT IRRIGATION SOLUTION
Status: AC
  Filled 2024-06-12: qty 500

## 2024-06-12 MED ORDER — LIDOCAINE 2% (20 MG/ML) 5 ML SYRINGE
INTRAMUSCULAR | Status: DC | PRN
  Administered 2024-06-12: 60 mg via INTRAVENOUS

## 2024-06-12 MED ORDER — POTASSIUM CHLORIDE CRYS ER 20 MEQ PO TBCR
40.0000 meq | EXTENDED_RELEASE_TABLET | Freq: Two times a day (BID) | ORAL | Status: AC
  Administered 2024-06-12 (×2): 40 meq via ORAL
  Filled 2024-06-12 (×2): qty 2

## 2024-06-12 MED ORDER — CHLORHEXIDINE GLUCONATE 0.12 % MT SOLN: 15.0000 mL | Freq: Once | OROMUCOSAL | Status: AC

## 2024-06-12 MED ORDER — FENTANYL CITRATE (PF) 100 MCG/2ML IJ SOLN: 25.0000 ug | INTRAMUSCULAR | Status: DC | PRN

## 2024-06-12 MED ORDER — HEPARIN 6000 UNIT IRRIGATION SOLUTION
Status: DC | PRN
  Administered 2024-06-12: 1

## 2024-06-12 MED ORDER — ONDANSETRON HCL 4 MG/2ML IJ SOLN
4.0000 mg | Freq: Once | INTRAMUSCULAR | Status: DC | PRN
Start: 2024-06-12 — End: 2024-06-12

## 2024-06-12 MED ORDER — PHENYLEPHRINE HCL-NACL 20-0.9 MG/250ML-% IV SOLN
INTRAVENOUS | Status: DC | PRN
  Administered 2024-06-12: 30 ug/min via INTRAVENOUS

## 2024-06-12 MED ORDER — PENTAFLUOROPROP-TETRAFLUOROETH EX AERO: 1.0000 | INHALATION_SPRAY | CUTANEOUS | Status: DC | PRN

## 2024-06-12 MED ORDER — PHENYLEPHRINE 80 MCG/ML (10ML) SYRINGE FOR IV PUSH (FOR BLOOD PRESSURE SUPPORT)
PREFILLED_SYRINGE | INTRAVENOUS | Status: DC | PRN
  Administered 2024-06-12: 160 ug via INTRAVENOUS

## 2024-06-12 MED ORDER — 0.9 % SODIUM CHLORIDE (POUR BTL) OPTIME
TOPICAL | Status: DC | PRN
  Administered 2024-06-12: 1000 mL

## 2024-06-12 MED ORDER — VASOPRESSIN 20 UNIT/ML IV SOLN
INTRAVENOUS | Status: AC
Start: 2024-06-12 — End: 2024-06-12
  Filled 2024-06-12: qty 1

## 2024-06-12 SURGICAL SUPPLY — 55 items
ARMBAND PINK RESTRICT EXTREMIT (MISCELLANEOUS) ×6 IMPLANT
BAG COUNTER SPONGE SURGICOUNT (BAG) ×3 IMPLANT
BAG DECANTER FOR FLEXI CONT (MISCELLANEOUS) ×3 IMPLANT
BIOPATCH RED 1 DISK 7.0 (GAUZE/BANDAGES/DRESSINGS) ×3 IMPLANT
CANISTER SUCTION 3000ML PPV (SUCTIONS) ×3 IMPLANT
CATH PALINDROME-P 19CM W/VT (CATHETERS) IMPLANT
CATH PALINDROME-P 23 W/VT (CATHETERS) IMPLANT
CATH PALINDROME-P 28CM W/VT (CATHETERS) IMPLANT
CLIP LIGATING EXTRA MED SLVR (CLIP) ×3 IMPLANT
CLIP LIGATING EXTRA SM BLUE (MISCELLANEOUS) ×3 IMPLANT
COVER PROBE W GEL 5X96 (DRAPES) ×3 IMPLANT
COVER SURGICAL LIGHT HANDLE (MISCELLANEOUS) ×3 IMPLANT
DERMABOND ADVANCED .7 DNX12 (GAUZE/BANDAGES/DRESSINGS) ×3 IMPLANT
DRAPE C-ARM 42X72 X-RAY (DRAPES) ×3 IMPLANT
DRAPE CHEST BREAST 15X10 FENES (DRAPES) ×3 IMPLANT
DRAPE HALF SHEET 40X57 (DRAPES) IMPLANT
ELECTRODE REM PT RTRN 9FT ADLT (ELECTROSURGICAL) ×3 IMPLANT
GAUZE 4X4 16PLY ~~LOC~~+RFID DBL (SPONGE) ×3 IMPLANT
GLOVE BIO SURGEON STRL SZ7.5 (GLOVE) ×3 IMPLANT
GOWN STRL REUS W/ TWL LRG LVL3 (GOWN DISPOSABLE) ×6 IMPLANT
GOWN STRL REUS W/ TWL XL LVL3 (GOWN DISPOSABLE) ×3 IMPLANT
HEMOSTAT SNOW SURGICEL 2X4 (HEMOSTASIS) IMPLANT
INSERT FOGARTY SM (MISCELLANEOUS) ×3 IMPLANT
KIT BASIN OR (CUSTOM PROCEDURE TRAY) ×3 IMPLANT
KIT PALINDROME-P 55CM (CATHETERS) IMPLANT
KIT TURNOVER KIT B (KITS) ×3 IMPLANT
NDL 18GX1X1/2 (RX/OR ONLY) (NEEDLE) ×3 IMPLANT
NDL HYPO 25GX1X1/2 BEV (NEEDLE) ×3 IMPLANT
NEEDLE 18GX1X1/2 (RX/OR ONLY) (NEEDLE) ×2 IMPLANT
NEEDLE HYPO 25GX1X1/2 BEV (NEEDLE) ×2 IMPLANT
PACK CV ACCESS (CUSTOM PROCEDURE TRAY) ×3 IMPLANT
PACK SRG BSC III STRL LF ECLPS (CUSTOM PROCEDURE TRAY) ×3 IMPLANT
PAD ARMBOARD POSITIONER FOAM (MISCELLANEOUS) ×6 IMPLANT
POWDER SURGICEL 3.0 GRAM (HEMOSTASIS) IMPLANT
SET MICROPUNCTURE 5F STIFF (MISCELLANEOUS) IMPLANT
SLING ARM FOAM STRAP LRG (SOFTGOODS) IMPLANT
SOAP 2 % CHG 4 OZ (WOUND CARE) ×3 IMPLANT
SOLN 0.9% NACL 1000 ML (IV SOLUTION) ×2 IMPLANT
SOLN 0.9% NACL POUR BTL 1000ML (IV SOLUTION) ×3 IMPLANT
SOLN STERILE WATER 1000 ML (IV SOLUTION) ×2 IMPLANT
SOLN STERILE WATER BTL 1000 ML (IV SOLUTION) ×3 IMPLANT
SUT ETHILON 3 0 PS 1 (SUTURE) ×3 IMPLANT
SUT MNCRL AB 4-0 PS2 18 (SUTURE) ×3 IMPLANT
SUT PROLENE 6 0 BV (SUTURE) IMPLANT
SUT SILK 2 0 SH (SUTURE) IMPLANT
SUT VIC AB 3-0 SH 27X BRD (SUTURE) ×6 IMPLANT
SYR 10ML LL (SYRINGE) ×3 IMPLANT
SYR 20ML LL LF (SYRINGE) ×6 IMPLANT
SYR 5ML LL (SYRINGE) ×3 IMPLANT
SYR CONTROL 10ML LL (SYRINGE) ×3 IMPLANT
SYR TOOMEY 50ML (SYRINGE) IMPLANT
TAPE STRIPS DRAPE STRL (GAUZE/BANDAGES/DRESSINGS) IMPLANT
TOWEL GREEN STERILE (TOWEL DISPOSABLE) ×3 IMPLANT
TOWEL GREEN STERILE FF (TOWEL DISPOSABLE) ×6 IMPLANT
UNDERPAD 30X36 HEAVY ABSORB (UNDERPADS AND DIAPERS) ×3 IMPLANT

## 2024-06-12 NOTE — Anesthesia Procedure Notes (Signed)
 Procedure Name: LMA Insertion Date/Time: 06/12/2024 7:52 AM  Performed by: Scherrie Mast, CRNAPre-anesthesia Checklist: Patient identified, Emergency Drugs available, Suction available and Patient being monitored Patient Re-evaluated:Patient Re-evaluated prior to induction Oxygen Delivery Method: Circle System Utilized Preoxygenation: Pre-oxygenation with 100% oxygen Induction Type: IV induction Ventilation: Mask ventilation without difficulty LMA: LMA inserted LMA Size: 4.0 Number of attempts: 1 Airway Equipment and Method: Bite block Placement Confirmation: positive ETCO2 Tube secured with: Tape Dental Injury: Teeth and Oropharynx as per pre-operative assessment

## 2024-06-12 NOTE — Progress Notes (Signed)
 Received patient in bed to unit.  Alert and oriented.  Informed consent signed and in chart.   TX duration:2  Patient tolerated well.  Transported back to the room  Alert, without acute distress.  Hand-off given to patient's nurse.   Access used: catheter Access issues: n/a  Total UF removed: 0 Medication(s) given: NA Post HD weight: 79.5KG   06/12/24 1409  Vitals  Temp 97.6 F (36.4 C)  Temp Source Oral  BP (!) 135/90  MAP (mmHg) 104  Pulse Rate 97  ECG Heart Rate 98  Resp 11  Weight 79.5 kg  Type of Weight Post-Dialysis  Oxygen Therapy  SpO2 94 %  During Treatment Monitoring  Blood Flow Rate (mL/min) 200 mL/min  Arterial Pressure (mmHg) -103.02 mmHg  Venous Pressure (mmHg) 112.32 mmHg  TMP (mmHg) 6.06 mmHg  Ultrafiltration Rate (mL/min) 350 mL/min  Dialysate Flow Rate (mL/min) 199 ml/min  Duration of HD Treatment -hour(s) 1.94 hour(s)  Cumulative Fluid Removed (mL) per Treatment  -19.89  HD Safety Checks Performed Yes  Intra-Hemodialysis Comments Tx completed  Post Treatment  Dialyzer Clearance Lightly streaked  Hemodialysis Intake (mL) 0 mL  Liters Processed 24  Fluid Removed (mL) 0 mL  Tolerated HD Treatment Yes  Fistula / Graft Right Other (Comment) Arteriovenous fistula  Placement Date/Time: 06/12/24 0900   Placed prior to admission: No  Orientation: Right  Access Location: (c) Other (Comment)  Access Type: Arteriovenous fistula  Site Condition No complications  Fistula / Graft Assessment Present;Thrill;Bruit  Hemodialysis Catheter Left Internal jugular Double lumen Permanent (Tunneled)  Placement Date/Time: 06/12/24 0833   Placed prior to admission: No  Serial / Lot #: [id number] / [id number]  Expiration Date: 02/03/29  Time Out: Correct patient;Correct site;Correct procedure  Maximum sterile barrier precautions: Hand hygiene;Cap;Mas...  Site Condition No complications  Blue Lumen Status Dead end cap in place  Red Lumen Status Dead end cap in place   Catheter fill solution Heparin  1000 units/ml  Catheter fill volume (Arterial) 1.9 cc  Catheter fill volume (Venous) 1.9  Dressing Type Gauze/Drain sponge  Dressing Status Antimicrobial disc/dressing in place  Interventions New dressing  Drainage Description None  Dressing Change Due 06/13/24  Post treatment catheter status Capped and Clamped    [name]

## 2024-06-12 NOTE — Progress Notes (Signed)
 Pt has been tentatively approved a schedule pending hep b lab results, just awaiting hep b antibody at this time.   Her possibility at [facility] clinic is a TTS, 1225 schedule. Met with pt and although she did not get her preferred schedule she said she would be willing to accept this and just be pt on a waitlist for an early chair with clinic after start. Will continue to assist.   Lavanda Fredrickson  Dialysis navigator [phone]

## 2024-06-12 NOTE — Progress Notes (Signed)
  Progress Note    06/12/2024 7:27 AM * Day of Surgery *  Subjective:  no overnight issues  Vitals:   06/12/24 0528 06/12/24 0655  BP: (!) 143/85 (!) 127/93  Pulse: 90 86  Resp: 18 18  Temp: 98.3 F (36.8 C) 98.3 F (36.8 C)  SpO2: 95% 98%    Physical Exam: Aaox3 Right chest port in place, no evidence of infection 2+ palpable right radial pulse  CBC    Component Value Date/Time   WBC 13.4 (H) 06/12/2024 0323   RBC 2.80 (L) 06/12/2024 0323   HGB 9.1 (L) 06/12/2024 0323   HGB 10.8 (L) 05/13/2023 1453   HGB 11.2 (L) 09/08/2017 0923   HCT 28.6 (L) 06/12/2024 0323   HCT 33.1 (L) 09/08/2017 0923   PLT 320 06/12/2024 0323   PLT 332 05/13/2023 1453   PLT 425 (H) 09/08/2017 0923   PLT 297 01/14/2017 1009   MCV 102.1 (H) 06/12/2024 0323   MCV 103 (H) 09/08/2017 0923   MCH 32.5 06/12/2024 0323   MCHC 31.8 06/12/2024 0323   RDW 16.0 (H) 06/12/2024 0323   RDW 13.6 09/08/2017 0923   LYMPHSABS 2.2 06/11/2024 0444   LYMPHSABS 2.7 09/08/2017 0923   MONOABS 1.0 06/11/2024 0444   EOSABS 0.5 06/11/2024 0444   EOSABS 0.3 09/08/2017 0923   BASOSABS 0.1 06/11/2024 0444   BASOSABS 0.0 09/08/2017 0923    BMET    Component Value Date/Time   NA 136 06/12/2024 0323   NA 142 09/08/2017 0923   K 3.4 (L) 06/12/2024 0323   K 3.7 09/08/2017 0923   CL 98 06/12/2024 0323   CL 97 (L) 09/08/2017 0923   CO2 25 06/12/2024 0323   CO2 28 09/08/2017 0923   GLUCOSE 137 (H) 06/12/2024 0323   GLUCOSE 107 09/08/2017 0923   BUN 23 06/12/2024 0323   BUN 49 (H) 09/08/2017 0923   CREATININE 3.48 (H) 06/12/2024 0323   CREATININE 3.34 (H) 05/13/2023 1453   CREATININE 3.1 (HH) 09/08/2017 0923   CALCIUM  7.4 (L) 06/12/2024 0323   CALCIUM  9.9 09/08/2017 0923   CALCIUM  8.9 04/12/2013 0851   GFRNONAA 14 (L) 06/12/2024 0323   GFRNONAA 14 (L) 05/13/2023 1453   GFRAA 24 (L) 08/16/2018 0745    INR    Component Value Date/Time   INR 1.2 06/07/2024 1853     Intake/Output Summary (Last 24  hours) at 06/12/2024 0727 Last data filed at 06/12/2024 0530 Gross per 24 hour  Intake 1183.3 ml  Output 2875 ml  Net -1691.7 ml     Assessment/plan:  68 y.o. female is now esrd. Plan tdc and avf vs avg today in OR. Discussed risks, benefits and alternatives. All questions answered, consent signed.     Boyde Grieco C. Sheree, MD [facility] Office: [phone] Pager: [phone]  06/12/2024 7:27 AM

## 2024-06-12 NOTE — Transfer of Care (Signed)
 Immediate Anesthesia Transfer of Care Note  Patient: [name]  Procedure(s) Performed: INSERTION OF DIALYSIS CATHETER (Left) ARTERIOVENOUS (AV) FISTULA CREATION RIGHT ARM (Right)  Patient Location: PACU  Anesthesia Type:General  Level of Consciousness: awake, alert , and oriented  Airway & Oxygen Therapy: Patient Spontanous Breathing and Patient connected to nasal cannula oxygen  Post-op Assessment: Report given to RN and Post -op Vital signs reviewed and stable  Post vital signs: Reviewed and stable  Last Vitals:  Vitals Value Taken Time  BP 93/65 0937  Temp 97 0937  Pulse 80 0937  Resp 16 0937  SpO2 100 0937    Last Pain:  Vitals:   06/12/24 0655  TempSrc: Oral  PainSc:          Complications: No notable events documented.

## 2024-06-12 NOTE — TOC Progression Note (Signed)
 Transition of Care [facility]) - Progression Note    Patient Details  Name: [name] MRN: [id number] Date of Birth: 04-Apr-1956  [facility] CM/SW Contact  Tom-Johnson, Julia Alkhatib Daphne, RN Phone Number: 06/12/2024, 3:26 PM  Clinical Narrative:     CM spoke with patient at bedside about home health recommendation. Patient declined, states she will not pay for someone to watch her do exercises. States she takes care of her elderly mother and wants to return home to do that. Has all necessary DME's at home. Continues inpatient HD.   Patient not Medically ready for discharge.  CM will continue to follow as patient progresses with care towards discharge.                       Expected Discharge Plan and Services                                               Social Drivers of Health (SDOH) Interventions SDOH Screenings   Food Insecurity: No Food Insecurity (06/08/2024)  Housing: Low Risk  (06/08/2024)  Transportation Needs: No Transportation Needs (06/08/2024)  Utilities: Not At Risk (06/08/2024)  Alcohol Screen: Low Risk  (05/22/2024)  Depression (PHQ2-9): High Risk (05/03/2024)  Financial Resource Strain: Low Risk  (05/22/2024)  Physical Activity: Insufficiently Active (05/22/2024)  Social Connections: Socially Isolated (06/08/2024)  Stress: No Stress Concern Present (05/22/2024)  Tobacco Use: Medium Risk (06/12/2024)  Health Literacy: Adequate Health Literacy (05/22/2024)    Readmission Risk Interventions    05/30/2023    3:51 PM 01/12/2023   10:41 AM  Readmission Risk Prevention Plan  Transportation Screening Complete Complete  PCP or Specialist Appt within 5-7 Days  Complete  PCP or Specialist Appt within 3-5 Days Complete   Home Care Screening  Complete  Medication Review (RN CM)  Complete  HRI or Home Care Consult Complete   Social Work Consult for Recovery Care Planning/Counseling Complete   Palliative Care Screening Not Applicable    Medication Review Oceanographer) Complete

## 2024-06-12 NOTE — Progress Notes (Signed)
 [facility] Progress Note  Subjective:  Seen in dialysis s/p TDC and AVF placement  Feeling fine  Vitals:   06/12/24 1015 06/12/24 1030 06/12/24 1045 06/12/24 1108  BP: 132/81 115/80 126/81 111/74  Pulse: 98 (!) 101 100 96  Resp: 17 17 17 11   Temp:   98.3 F (36.8 C) 97.9 F (36.6 C)  TempSrc:      SpO2: 91% 93% 94% 93%  Weight:    79.5 kg  Height:        Exam: Gen alert, no distress Sclera anicteric, throat clear  No jvd or bruits Chest clear bilat to bases RRR no MRG Abd soft ntnd no mass or ascites +bs Ext no LE or UE edema, no other edema Neuro is alert, Ox 3 , nf, mild asterixis TDC c/d/I, R AVF upper arm incision c/d/I, 2+ radial pulse   Home bp meds: Toprol  xl 100 every day   Date                 Creat               eGFR (ml/min) 2008- 2016 1.4- 2.5 2017- 2018 2.0- 2.6 2019- 2020 1.9- 2.4 2021                2.10- 4.94 2022- 2023 2.10- 6.61 2024                2.70- 4.00                                             Jan 2025         3.56                 12 ml/min                     Aug 2025         4.97                 8 ml/min                               06/07/24          5.00                 9                                  06/08/24          4.57                 10 ml/min                       Assessment/ Plan: CKD 5: w/ worsening anion gap and metabolic acidosis. Admitted for possible PNA, sepsis. She has advanced CKD 5 f/b Dr Rayburn at [facility] w/ uremic symptoms including fatigue, loss of appetite and more recently confusion. Plan is for HD initiation. S/p TDC and AVF today.  1st HD today. Alerted SW for CLIP to outpt needs.  Metabolic acidosis: resolved Anemia ckd: tsat 9%, ferritin 291. IV iron  load ordered as venofer  200mg  daily x 5.  Starting also darbe 60 mcg weekly for now. 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   No UF with HD and hopefully can just managing IVF needs with HD now. CAP: on abx per pmd Possible UTI: on abx per pmd Hypokalemia: being supplemented po and IV - use 4K dialysate with HD for now, cont supplements PRN   Manuelita Barters MD [facility] Pager [phone]   Recent Labs  Lab 06/07/24 1853 06/08/24 0527 06/08/24 1858 06/09/24 0830 06/09/24 2347 06/11/24 0444 06/12/24 0323  HGB 7.9* 7.5*  --  7.0*   < > 9.0* 9.1*  ALBUMIN 3.8  --   --  2.5*  --   --   --   CALCIUM  7.4* 6.7*   < > 8.0*   < > 7.2* 7.4*  PHOS  --  5.2*  --   --   --   --  2.9  CREATININE 5.00* 4.57*   < > 4.53*   < > 3.71* 3.48*  K 3.4* 3.1*   < > 2.4*   < > 2.4* 3.4*   < > = values in this interval not displayed.   Recent Labs  Lab 06/08/24 1858  IRON  20*  TIBC 232*  FERRITIN 291   Inpatient medications:  sodium chloride    Intravenous Once   allopurinol   150 mg Oral Q2000   amLODipine   10 mg Oral Daily   calcitRIOL   0.25 mcg Oral Daily   Chlorhexidine  Gluconate Cloth  6 each Topical Daily   darbepoetin (ARANESP) injection - DIALYSIS  60 mcg Subcutaneous Q Sat-1800   doxycycline   100 mg Oral Q12H   DULoxetine   60 mg Oral Daily   [START ON 06/13/2024] heparin   5,000 Units Subcutaneous Q8H   hydrALAZINE  50 mg Oral Q8H   Influenza vac split trivalent PF  0.5 mL Intramuscular Tomorrow-1000   metoprolol  succinate  100 mg Oral QHS   mupirocin ointment  1 Application Nasal BID   potassium chloride   40 mEq Oral BID   valACYclovir   500 mg Oral Daily    anticoagulant sodium citrate      cefTRIAXone  (ROCEPHIN )  IV 1 g (06/11/24 2202)   iron  sucrose Stopped (06/11/24 1221)   magnesium  sulfate bolus IVPB     acetaminophen , alteplase , anticoagulant sodium citrate , guaiFENesin-dextromethorphan, heparin , hydrALAZINE, ipratropium-albuterol , lidocaine  (PF), lidocaine -prilocaine, melatonin, oxyCODONE , pentafluoroprop-tetrafluoroeth, polyethylene glycol, prochlorperazine , sodium  chloride flush

## 2024-06-12 NOTE — Progress Notes (Signed)
 PT Cancellation Note  Patient Details Name: [name] MRN: [id number] DOB: February 06, 1956   Cancelled Treatment:    Reason Eval/Treat Not Completed: Patient at procedure or test/unavailable  Plan tdc and avf vs avg today in OR;   Will follow up later today as time allows;  Otherwise, will follow up for PT tomorrow;   Thank you,  Silvano Currier, PT  Acute Rehabilitation Services Office [phone]  Silvano VEAR Currier 06/12/2024, 9:21 AM

## 2024-06-12 NOTE — Progress Notes (Signed)
 [facility]                                                                               [name], is a 68 y.o. female, DOB - May 05, 1956, FMW:[id number] Admit date - 06/07/2024    Outpatient Primary MD for the patient is [name], Jeanette SAUNDERS, DO  LOS - 5  days    Brief summary   68 y.o. 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  She is being treated for LLL pneumonia and advanced CKD , possible HD catheter placement and start her on HD this admission.    Assessment & Plan    Assessment and Plan:   ESRD with metabolic acidosis:  Nephrology on board and to assist with initiating HD.  Vascular surgery consulted and plan for HD access placement today. Continue with IV bicarb for metabolic acidosis.    LLL pneumonia Started her on IV ceftriaxone  and completed 3 days of azithromycin.  She is on RA. Procalcitonin is 1.  Improving leukocytosis. She remains afebrile.    Uncontrolled hypertension/ Hypertensive urgency:  BP parameters have improved on norvasc  10 mg daily and hydralazine 50 mg TID.    Anemia sec to CKD and iron  deficiency anemia.  - anemia panel showing low iron .  IV iron  load for 5 days. He will be on Darbepoetin 60 mcg weekly for now.  - stool for occult blood ordered.  - underwent 1 unit of prbc transfusion for hemoglobin of 7 and hemoglobin improved to 9 and remains stable aroun d9   Possible UTI;  UA shows large leukocytes with many bacteria. Urine cultures showing insignificant growth.  Blood cultures are pending and negative so far.  On IV ceftriaxone .      Dyspnea : Appears to have improved.  BNP is 1769.  Echo shows LVEF of 70 to 75% , hyperdynamic function. Indeterminate diastolic filling  due to E-A fusion.  Troponins are mildly elevated possibly from demand ischemia from ESRD, metabolic acidosis, and pneumonia.     Short gut syndrome/ Crohn's disease:  S/p total colectomy, with right ileostomy.     Painful lesions on the labia suspicious for genital herpes Left external labia is swollen, indurated and tender.  CT abd and pelvis show Inflammation in the subcutaneous fat of the region of the left mons pubis extending into the left upper labial region. Findings are consistent with cellulitis and not associated with focal abscess or subcutaneous gas. Patient on valacyclovir ,. Received a dose of diflucan  and is on doxycycline  for possible staph infection.  Complete the 10  course of doxycycline  in addition to valacyclovir .    Gout:  Resume allopurinol   Body mass index is 31.05 kg/m. Obesity   Hypokalemia and hypomagnesemia Replaced. Repeat levels tonight.  ? High output from ileostomy.     Estimated body mass index is 31.05 kg/m as  calculated from the following:   Height as of this encounter: 5' 3 (1.6 m).   Weight as of this encounter: 79.5 kg.  Code Status: full code DVT Prophylaxis:  heparin  injection 5,000 Units Start: 06/13/24 0600   Level of Care: Level of care: Telemetry Medical Family Communication: none at bedside.   Disposition Plan:     Remains inpatient appropriate:CLIP for HD.   Procedures:  Echocardiogram.   Consultants:   Nephrology  Vascular surgery.   Antimicrobials:   Anti-infectives (From admission, onward)    Start     Dose/Rate Route Frequency Ordered Stop   06/11/24 1015  doxycycline  (VIBRA -TABS) tablet 100 mg        100 mg Oral Every 12 hours 06/11/24 0929 06/16/24 0959   06/09/24 1730  fluconazole  (DIFLUCAN ) tablet 200 mg        200 mg Oral  Once 06/09/24 1632 06/09/24 1900    06/08/24 2200  cefTRIAXone  (ROCEPHIN ) 1 g in sodium chloride  0.9 % 100 mL IVPB        1 g 200 mL/hr over 30 Minutes Intravenous Every 24 hours 06/07/24 2223 06/13/24 2159   06/08/24 1000  azithromycin (ZITHROMAX) tablet 500 mg  Status:  Discontinued        500 mg Oral Daily 06/07/24 2223 06/11/24 0929   06/08/24 1000  valACYclovir  (VALTREX ) tablet 500 mg        500 mg Oral Daily 06/07/24 2307 06/17/24 0959   06/07/24 2245  valACYclovir  (VALTREX ) tablet 500 mg  Status:  Discontinued        500 mg Oral Daily 06/07/24 2232 06/07/24 2307   06/07/24 2115  cefTRIAXone  (ROCEPHIN ) 1 g in sodium chloride  0.9 % 100 mL IVPB        1 g 200 mL/hr over 30 Minutes Intravenous  Once 06/07/24 2107 06/07/24 2255   06/07/24 2115  azithromycin (ZITHROMAX) tablet 500 mg        500 mg Oral  Once 06/07/24 2108 06/07/24 2203        Medications  Scheduled Meds:  sodium chloride    Intravenous Once   allopurinol   150 mg Oral Q2000   amLODipine   10 mg Oral Daily   calcitRIOL   0.25 mcg Oral Daily   Chlorhexidine  Gluconate Cloth  6 each Topical Daily   darbepoetin (ARANESP) injection - DIALYSIS  60 mcg Subcutaneous Q Sat-1800   doxycycline   100 mg Oral Q12H   DULoxetine   60 mg Oral Daily   [START ON 06/13/2024] heparin   5,000 Units Subcutaneous Q8H   hydrALAZINE  50 mg Oral Q8H   Influenza vac split trivalent PF  0.5 mL Intramuscular Tomorrow-1000   metoprolol  succinate  100 mg Oral QHS   mupirocin ointment  1 Application Nasal BID   potassium chloride   40 mEq Oral BID   valACYclovir   500 mg Oral Daily   Continuous Infusions:  anticoagulant sodium citrate      cefTRIAXone  (ROCEPHIN )  IV 1 g (06/11/24 2202)   iron  sucrose Stopped (06/11/24 1221)   magnesium  sulfate bolus IVPB     PRN Meds:.acetaminophen , alteplase , anticoagulant sodium citrate , guaiFENesin-dextromethorphan, heparin , hydrALAZINE, ipratropium-albuterol , lidocaine  (PF), lidocaine -prilocaine, melatonin, oxyCODONE ,  pentafluoroprop-tetrafluoroeth, polyethylene glycol, prochlorperazine , sodium chloride  flush    Subjective:   Nayomi Tabron was seen and examined today.  Groin pain has improved. No chest pain or sob.   Objective:   Vitals:   06/12/24 1108 06/12/24 1202 06/12/24 1210 06/12/24 1220  BP: 111/74 (!) 137/94 (!) 126/90 133/84  Pulse: 96  100 95 95  Resp: 11 (!) 22 17 20   Temp: 97.9 F (36.6 C)     TempSrc:      SpO2: 93% 97% 93% 92%  Weight: 79.5 kg     Height:        Intake/Output Summary (Last 24 hours) at 06/12/2024 1236 Last data filed at 06/12/2024 0915 Gross per 24 hour  Intake 1483.3 ml  Output 2875 ml  Net -1391.7 ml   Filed Weights   06/11/24 0701 06/12/24 0655 06/12/24 1108  Weight: 81.5 kg 81.5 kg 79.5 kg    General exam: Appears calm and comfortable  Respiratory system: Clear to auscultation. Respiratory effort normal. Cardiovascular system: S1 & S2 heard, RRR. No JVD,  Gastrointestinal system: Abdomen is nondistended, soft and nontender. S/p right sided ileostomy.  Central nervous system: Alert and oriented. No focal neurological deficits. Extremities: no pedal edema.  Skin: No rashes,  Psychiatry:  Mood & affect appropriate.      Data Reviewed:  I have personally reviewed following labs and imaging studies   CBC Lab Results  Component Value Date   WBC 13.4 (H) 06/12/2024   RBC 2.80 (L) 06/12/2024   HGB 9.1 (L) 06/12/2024   HCT 28.6 (L) 06/12/2024   MCV 102.1 (H) 06/12/2024   MCH 32.5 06/12/2024   PLT 320 06/12/2024   MCHC 31.8 06/12/2024   RDW 16.0 (H) 06/12/2024   LYMPHSABS 2.2 06/11/2024   MONOABS 1.0 06/11/2024   EOSABS 0.5 06/11/2024   BASOSABS 0.1 06/11/2024     Last metabolic panel Lab Results  Component Value Date   NA 136 06/12/2024   K 3.4 (L) 06/12/2024   CL 98 06/12/2024   CO2 25 06/12/2024   BUN 23 06/12/2024   CREATININE 3.48 (H) 06/12/2024   GLUCOSE 137 (H) 06/12/2024   GFRNONAA 14 (L) 06/12/2024   GFRAA 24 (L) 08/16/2018    CALCIUM  7.4 (L) 06/12/2024   PHOS 2.9 06/12/2024   PROT 6.7 06/09/2024   ALBUMIN 2.5 (L) 06/09/2024   BILITOT 0.7 06/09/2024   ALKPHOS 122 06/09/2024   AST 25 06/09/2024   ALT 22 06/09/2024   ANIONGAP 13 06/12/2024    CBG (last 3)  No results for input(s): GLUCAP in the last 72 hours.    Coagulation Profile: Recent Labs  Lab 06/07/24 1853  INR 1.2     Radiology Studies: [facility] Chest Port 1 View Result Date: 06/12/2024 CLINICAL DATA:  Status post catheter placement. EXAM: PORTABLE CHEST 1 VIEW COMPARISON:  June 07, 2024. FINDINGS: The heart size and mediastinal contours are within normal limits. Both lungs are clear. Right internal jugular Port-A-Cath is unchanged. Interval placement of left internal jugular dialysis catheter with distal tip in right atrium. No pneumothorax is noted. The visualized skeletal structures are unremarkable. IMPRESSION: Interval placement of left internal jugular dialysis catheter with distal tip in right atrium. Electronically Signed   By: Lynwood Landy Raddle M.D.   On: 06/12/2024 11:37       Elgie Butter M.D. [facility] 06/12/2024, 12:36 PM  Available via Epic secure chat 7am-7pm After 7 pm, please refer to night coverage provider listed on amion.

## 2024-06-12 NOTE — Anesthesia Postprocedure Evaluation (Signed)
 Anesthesia Post Note  Patient: [name]  Procedure(s) Performed: INSERTION OF DIALYSIS CATHETER (Left) ARTERIOVENOUS (AV) FISTULA CREATION RIGHT ARM (Right)     Patient location during evaluation: PACU Anesthesia Type: General Level of consciousness: awake and alert Pain management: pain level controlled Vital Signs Assessment: post-procedure vital signs reviewed and stable Respiratory status: spontaneous breathing, nonlabored ventilation and respiratory function stable Cardiovascular status: stable and blood pressure returned to baseline Anesthetic complications: no   There were no known notable events for this encounter.  Last Vitals:  Vitals:   06/12/24 1015 06/12/24 1030  BP: 132/81 115/80  Pulse: 98 (!) 101  Resp: 17 17  Temp:    SpO2: 91% 93%    Last Pain:  Vitals:   06/12/24 1030  TempSrc:   PainSc: 0-No pain                 Debby FORBES Like

## 2024-06-12 NOTE — Progress Notes (Signed)
 0730: Per report given by Channing, RN: Patient has been taken for sx this morning. Patient not in room at this time.  1032: Report given to HD RN at this time, all questions answered. (Telemetry order and primary route of medication)\  1442: HD to floor Report: Patient slept through treatment, No fluid pulled, 2 Hours of run time. No medications given. Patient on way back to floor. 1500: Patient back to floor, Arnette, NT receives patient in to room and obtains vitals.  1520: This RN to bedside at this time. Missed medications given. Patient states she has ordered food at this time. (Asking about the renal diet). Purwick in place, Ostomy bag drained.   Evening assessment: Patient has wet moderately strong cough.   Report given to Channing, RN:  Patient is to have HD tomorrow. No infusions running at this time. Patient's wound care still needs to be complete for today.

## 2024-06-12 NOTE — Op Note (Signed)
 Patient name: [name] MRN: [id number] DOB: 09-14-1955 Sex: female  06/12/2024 Pre-operative Diagnosis: End-stage renal disease Post-operative diagnosis:  Same Surgeon:  Penne C. Sheree, MD Assistant: Ahmed Holster, PA Procedure Performed: 1.  Placement of left IJ 23 cm tunneled dialysis catheter with ultrasound fluoroscopic guidance 2.  Creation of right first stage basilic vein AV fistula   Indications: 68 year old female now with end-stage renal disease.  She has an indwelling Port-A-Cath in her right chest is indicated for tunneled dialysis catheter placement on the left and fistula versus graft in the right upper extremity.  Given the complexity of the case,  the assistant was necessary in order to expedite the procedure and safely perform the technical aspects of the operation.  The assistant provided traction and countertraction to assist with exposure of the artery and vein.  They also assisted with suture ligation of multiple venous branches.  They played a critical role in the anastomosis. These skills, especially following the Prolene suture for the anastomosis, could not have been adequately performed by a scrub tech assistant.    Findings: Left IJ was patent and compressible.  A tunnel dialysis catheter was placed in the SVC atrial junction.  The right basilic vein measured approximately 4 mm above the antecubitum where there was branching.  The brachial artery was 3 mm free of disease.  At completion there was a strong thrill in the fistula and a palpable right radial artery pulse at the wrist confirmed with Doppler and also a strong Doppler signal in the right ulnar artery at the wrist..   Procedure:  The patient was identified in the holding area and taken to the operating room where she was placed upon operative table and LMA anesthesia was induced.  She was sterilely prepped and draped in the left neck and right upper extremity in usual fashion, antibiotics were  administered and a timeout was called.  Ultrasound was used to identify the left internal jugular vein which was large, patent and compressible.  The left IJ was cannulated with a micropuncture needle followed by wire and the sheath.  From a counterincision I then tunneled a 23 cm tunneled dialysis catheter.  A J-wire was then placed under fluoroscopic guidance into the IVC.  The wire tract was serially dilated and the introducer sheath was placed with fluoroscopy.  The catheter was then placed to the SVC atrial junction was noted to have a smooth curve.  This was flushed with heparinized saline fix the skin with nylon suture and locked with 1.9 cc of concentrated heparin  in both ports.  The neck incision was closed with 4-0 Monocryl and Dermabond and a sterile dressing was placed.  Attention was then turned to the right upper extremity.  We evaluated with ultrasound identified what appeared to be a suitable basilic vein measuring almost 4 mm but there was branching towards the antecubitum.  A curvilinear incision was made between that and the palpable underlying brachial pulse.  I first dissected down to the vein protected the nerve marked the vein for orientation and divided branches between ties.  I dissected out the artery free from the surrounding neurovascular bundle.  This was encircled with vessel loop.  I then tied off to distal branches and the basilic vein transected this flushed with heparinized saline and spatulated.  The artery was clamped distally and proximally opened longitudinally and flushed with heparinized saline distally.  The vein was then sewn end to side with 6-0 Prolene suture.  Prior completion were left flushing all directions.  Upon completion there is a very strong thrill in the vein confirmed with Doppler and there was a palpable radial artery pulse and a strong signal in the radial artery at the wrist as well as ulnar artery at the wrist.  Satisfied with this we irrigated the wound  and closed in layers of Vicryl and Monocryl.  Dermabond was placed at the skin level.  The patient was awakened from anesthesia having tolerated procedure without Ameeth complication.  All counts were correct at completion.  EBL: 25 cc    Deseray Daponte C. Sheree, MD [facility] Office: [phone] Pager: [phone]

## 2024-06-12 NOTE — Progress Notes (Signed)
          Postop note  Patient evaluated bedside.  There is a palpable right upper arm thrill and right radial pulse at the wrist.  Chest x-ray reviewed with line in proper position and port remains in place.  TDC has been used for dialysis and apparently worked well and the port can continue to be accessed for IV access as necessary.  She will follow-up in 4 to 6 weeks with right upper extremity dialysis duplex to plan second stage basilic vein fistula.  Mindie Rawdon C. Sheree, MD [facility] Office: [phone] Pager: [phone]

## 2024-06-12 NOTE — Plan of Care (Signed)
 Please see shift progressive note about this [name].   [name] Charity fundraiser, Scientist, research (physical sciences)

## 2024-06-13 ENCOUNTER — Encounter (HOSPITAL_COMMUNITY): Payer: Self-pay | Admitting: Vascular Surgery

## 2024-06-13 ENCOUNTER — Ambulatory Visit

## 2024-06-13 DIAGNOSIS — J189 Pneumonia, unspecified organism: Secondary | ICD-10-CM | POA: Diagnosis not present

## 2024-06-13 LAB — CULTURE, BLOOD (ROUTINE X 2)
Culture: NO GROWTH
Culture: NO GROWTH
Special Requests: ADEQUATE
Special Requests: ADEQUATE

## 2024-06-13 LAB — HEPATITIS B SURFACE ANTIBODY, QUANTITATIVE: Hep B S AB Quant (Post): 411 m[IU]/mL

## 2024-06-13 MED ORDER — MAGNESIUM SULFATE 4 GM/100ML IV SOLN
4.0000 g | Freq: Once | INTRAVENOUS | Status: AC
  Administered 2024-06-13: 4 g via INTRAVENOUS
  Filled 2024-06-13: qty 100

## 2024-06-13 MED ORDER — HEPARIN SOD (PORK) LOCK FLUSH 100 UNIT/ML IV SOLN: 500.0000 [IU] | Freq: Once | INTRAVENOUS | Status: DC | PRN

## 2024-06-13 MED ORDER — SODIUM CHLORIDE 0.9 % IV BOLUS
1000.0000 mL | Freq: Once | INTRAVENOUS | Status: DC
  Filled 2024-06-13: qty 1000

## 2024-06-13 MED ORDER — HEPARIN SOD (PORK) LOCK FLUSH 100 UNIT/ML IV SOLN: 250.0000 [IU] | Freq: Once | INTRAVENOUS | Status: DC | PRN

## 2024-06-13 MED ORDER — POTASSIUM CHLORIDE CRYS ER 20 MEQ PO TBCR
40.0000 meq | EXTENDED_RELEASE_TABLET | Freq: Once | ORAL | Status: AC
Start: 2024-06-13 — End: 2024-06-13
  Administered 2024-06-13: 40 meq via ORAL
  Filled 2024-06-13: qty 2

## 2024-06-13 MED ORDER — HEPARIN SODIUM (PORCINE) 1000 UNIT/ML IJ SOLN
INTRAMUSCULAR | Status: AC
Start: 2024-06-13 — End: 2024-06-13
  Filled 2024-06-13: qty 4

## 2024-06-13 NOTE — Progress Notes (Addendum)
 [facility] admissions now willing to accommodated her schedule wishes by giving her a [employer] spot 11:15 MWF chair with a start of Friday 10/10.  Will update pt and provide updated schedule letter when applicable. Will update AVS. care team made aware of this change. Will continue to assist.   Stacia Feazell [facility] [phone]  Addendum 12:10 Met with pt bedside, discussed new schedule, pt agreeable at this time and had no questions. Physical schedule letter updated has been provided to pt at this time, this letter includes the date, time, address, and other information regarding her upcoming appt on the 10th. AVS has been updated as well. Will continue to assist as needed.

## 2024-06-13 NOTE — Progress Notes (Signed)
 0730: This nurse assumes care for this patient at this time. N-N report received from Jemison, RN: Dressing change completed, patient up to bathroom with stand-by assistance, patient is supposed to be scheduled for HD today, previous RN attempted to call HD for this patient twice in past hour (unsuccessfully). Patient has 6am medication scheduled and due but held until HD schedule is known. Per previous RN, wound to patient's L labia is actively draining and patient may be unintentionally cross contaminating the rest of her skin. Recommends education and CHG  Morning Assessment (by exception):  Port site clean, dry, and intact Fistula and Cath site appears free of infection, ointment placed. No external catheter present. Patient states has been up ad lib. Has not been able to keep wound dressing to L labia as she is up to the restroom often. Denies dysuria. Wet but non-productive cough noted. Lungs are clear. Ostomy remains, site is intact, no outward drainage. Non-pitting edema to bilateral legs. PIV in L posterior hand. Patient concerned about plan of care, states unsure about actually schedule.    9058: HD called at this time concerning patient's HD schedule for today (to coordinate morning medications), staff states no orders in.  1000: Per orders placed, patient is scheduled for HD tomorrow, patient notified and is unhappy with lack of communication. Medications given.  1200: Patient is seen dangling and eating lunch. Physical therapy bedside waiting to work with patient. Will administer IV meds once complete.  1205: Per Dr. Norine, patient will now have HD today.  1300: HD contacted to determine when patient is scheduled for HD (in order to time medications appropriately), Per Clem, RN patient will be sent for around 7-8 pm. Medications will be administered appropriately.  1502: Report given to HD RN at this time. All questions answered. RN states transportation is on it's way for patient. Will  relay information to patient.  1841: No fluid, vitals stable, 2.5 hours, Cath dressing change   Unknown to this RN: Patient returns to floor, unable to assess d/t emergency on the floor. Report given to Rosaura, RN, all questions answered. Please follow future charting for updated assessment of this patient.   Oluwatosin Higginson, Charity fundraiser

## 2024-06-13 NOTE — Progress Notes (Signed)
 Physical Therapy Treatment and Discharge Patient Details Name: [name] MRN: [id number] DOB: 1955-11-19 Today's Date: 06/13/2024   History of Present Illness 68 y.o. female with admitted 10/2 with possible left lower lobe community-acquired bacterial pneumonia, Possible UTI, acute metabolic acidosis. 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    PT Comments  Continuing work on functional mobility and activity tolerance;  Session focused on progressive amb, with very nice progress compared to eval; Able to walk the hallways with Rollator, mild DOE noted, 2/4; we discussed managing at home; Questions solicited and answered; Majority of PT goals met, will sign off, and encouraged the pt to get up and walk in the hallways daily; OK for dc home from PT standpoint     If plan is discharge home, recommend the following: Assist for transportation   Can travel by private vehicle        Equipment Recommendations  None recommended by PT (Pt has access to a rolator)    Recommendations for Other Services       Precautions / Restrictions Precautions Precautions: Fall Recall of Precautions/Restrictions: Intact Restrictions Weight Bearing Restrictions Per Provider Order: No     Mobility  Bed Mobility Overal bed mobility: Independent                  Transfers Overall transfer level: Modified independent Equipment used: None Transfers: Sit to/from Stand Sit to Stand: Modified independent (Device/Increase time)                Ambulation/Gait Ambulation/Gait assistance: Supervision, Modified independent (Device/Increase time) Gait Distance (Feet): 200 Feet Assistive device: Rollator (4 wheels) Gait Pattern/deviations: Step-through  pattern       General Gait Details: Much more smooth and stable gait with the use of RW today   Stairs         General stair comments: Pt politely declined practicing steps today   Wheelchair Mobility     Tilt Bed    Modified Rankin (Stroke Patients Only)       Balance     Sitting balance-Leahy Scale: Normal       Standing balance-Leahy Scale: Good                              Communication Communication Communication: No apparent difficulties  Cognition Arousal: Alert Behavior During Therapy: WFL for tasks assessed/performed   PT - Cognitive impairments: No apparent impairments                         Following commands: Intact      Cueing Cueing Techniques: Verbal cues  Exercises      General Comments General comments (skin integrity, edema, etc.): More energy, Walked on room air and O2 sats 99%, HR 83, BP 131/70; mild DOE at end of walk      Pertinent Vitals/Pain Pain Assessment Pain Assessment: No/denies pain    Home Living                          Prior Function            PT Goals (current goals can now be found in the care plan section) Acute  Rehab PT Goals Patient Stated Goal: Get well, return home PT Goal Formulation: All assessment and education complete, DC therapy Progress towards PT goals: Goals met/education completed, patient discharged from PT (Majority of goals met)    Frequency    Min 2X/week      PT Plan      Co-evaluation              AM-PAC PT 6 Clicks Mobility   Outcome Measure  Help needed turning from your back to your side while in a flat bed without using bedrails?: None Help needed moving from lying on your back to sitting on the side of a flat bed without using bedrails?: None Help needed moving to and from a bed to a chair (including a wheelchair)?: None Help needed standing up from a chair using your arms (e.g., wheelchair or bedside chair)?: None Help  needed to walk in hospital room?: None Help needed climbing 3-5 steps with a railing? : A Little 6 Click Score: 23    End of Session   Activity Tolerance: Patient tolerated treatment well Patient left: in bed;with call bell/phone within reach Nurse Communication: Mobility status PT Visit Diagnosis: Unsteadiness on feet (R26.81);Other abnormalities of gait and mobility (R26.89);Muscle weakness (generalized) (M62.81);History of falling (Z91.81);Difficulty in walking, not elsewhere classified (R26.2)     Time: 1310-1330 PT Time Calculation (min) (ACUTE ONLY): 20 min  Charges:    $Gait Training: 8-22 mins PT General Charges $$ ACUTE PT VISIT: 1 Visit                     Silvano Currier, PT  Acute Rehabilitation Services Office [phone] Secure Chat welcomed    Silvano VEAR Currier 06/13/2024, 3:55 PM

## 2024-06-13 NOTE — Progress Notes (Addendum)
 [facility] Progress Note  Subjective:  Tolerated 1st HD fine,has TTS spot at [facility] pending final approval Ambulated to bathroom without orthostatic symptoms  Vitals:   06/12/24 1651 06/12/24 2013 06/13/24 0457 06/13/24 0835  BP: 116/67 113/69 128/76 94/73  Pulse: (!) 108 98 85 82  Resp: 18 18 18 18   Temp: 98.6 F (37 C) 98.3 F (36.8 C) 98.1 F (36.7 C) 98.2 F (36.8 C)  TempSrc: Oral Oral Oral Oral  SpO2: 92% 97% 94% 97%  Weight:      Height:        Exam: Gen alert, no distress Sclera anicteric, throat clear  No jvd or bruits Chest clear bilat to bases RRR no MRG Abd soft ntnd no mass or ascites +bs Ext no LE or UE edema, no other edema Neuro is alert, Ox 3 , nf, mild asterixis TDC c/d/I, R AVF upper arm incision c/d/I with swelling/ecchymosis just distal to incision c/w hematoma - pt says no increasing, 2+ radial pulse   Home bp meds: Toprol  xl 100 every day   Date                 Creat               eGFR (ml/min) 2008- 2016 1.4- 2.5 2017- 2018 2.0- 2.6 2019- 2020 1.9- 2.4 2021                2.10- 4.94 2022- 2023 2.10- 6.61 2024                2.70- 4.00                                             Jan 2025         3.56                 12 ml/min                     Aug 2025         4.97                 8 ml/min                               06/07/24          5.00                 9                                  06/08/24          4.57                 10 ml/min                       Assessment/ Plan: CKD 5 now ESRD:  w/ worsening anion gap and metabolic acidosis. Admitted for possible PNA, sepsis. She has advanced CKD 5 f/b Dr Rayburn at [facility] w/ uremic symptoms including fatigue, loss of appetite and more recently confusion. Has initiated HD as of 10/7.  S/p TDC and AVF 10/7.  Has TTS spot tentatively - will plan next HD tomorrow,  outpt fine if discharged.  Anemia ckd: tsat 9%, ferritin 291. IV iron  load ordered as venofer  200mg  daily x 5.   Starting also darbe 60 mcg weekly for now. 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   No UF with HD and hopefully can just managing IVF needs with HD now. CAP: on abx per pmd Possible UTI: on abx per pmd Hypokalemia: being supplemented po and IV - use 4K dialysate with HD for now, cont supplements PRN. Weekly K at outpt HD for now HSV outbreak complicated by cellulitis: on antivirals and antibiotics per primary  Final approval for outpt HD pending currently --> if it's secured it's fine for her to discharge today. Abigail Pack to check with clinic and let us  know ASAP.  Pt agreeable to d/c if primary thinks medically ok. If she doesn't d/c she'll get HD here tomorrow AM.  ADDENDUM:  HD unit has changed her outpt HD schedule to MWF at her request - she can start 10/10.  She cannot receive HD here until about 7-8pm tonight at the earliest so will need to d/c home tomorrow.  Manuelita Barters MD [facility] Pager [phone]   Recent Labs  Lab 06/07/24 1853 06/08/24 0527 06/08/24 1858 06/09/24 0830 06/09/24 2347 06/11/24 0444 06/12/24 0323  HGB 7.9* 7.5*  --  7.0*   < > 9.0* 9.1*  ALBUMIN 3.8  --   --  2.5*  --   --   --   CALCIUM  7.4* 6.7*   < > 8.0*   < > 7.2* 7.4*  PHOS  --  5.2*  --   --   --   --  2.9  CREATININE 5.00* 4.57*   < > 4.53*   < > 3.71* 3.48*  K 3.4* 3.1*   < > 2.4*   < > 2.4* 3.4*   < > = values in this interval not displayed.   Recent Labs  Lab 06/08/24 1858  IRON  20*  TIBC 232*  FERRITIN 291   Inpatient medications:  sodium chloride    Intravenous Once   allopurinol   150 mg Oral Q2000   amLODipine   10 mg Oral Daily   calcitRIOL   0.25 mcg Oral Daily   Chlorhexidine  Gluconate Cloth  6 each Topical Daily   darbepoetin (ARANESP) injection - DIALYSIS  60 mcg Subcutaneous Q Sat-1800   doxycycline   100 mg Oral Q12H   DULoxetine    60 mg Oral Daily   heparin   5,000 Units Subcutaneous Q8H   hydrALAZINE  50 mg Oral Q8H   Influenza vac split trivalent PF  0.5 mL Intramuscular Tomorrow-1000   metoprolol  succinate  100 mg Oral QHS   mupirocin ointment  1 Application Nasal BID   potassium chloride   40 mEq Oral Once   valACYclovir   500 mg Oral Daily    iron  sucrose 200 mg (06/12/24 1626)   magnesium  sulfate bolus IVPB     acetaminophen , guaiFENesin-dextromethorphan, hydrALAZINE, ipratropium-albuterol , melatonin, polyethylene glycol, prochlorperazine , sodium chloride  flush

## 2024-06-13 NOTE — Progress Notes (Signed)
 [facility]                                                                               [name], is a 68 y.o. female, DOB - 1956/05/15, FMW:[id number] Admit date - 06/07/2024    Outpatient Primary MD for the patient is [name], Jeanette SAUNDERS, DO  LOS - 6  days    Brief summary   68 y.o. 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  She is being treated for LLL pneumonia and advanced CKD.   Assessment & Plan    Assessment and Plan:  Progressed to ESRD Nephrology on board, initiated HD on 10/7 Vascular surgery placed [facility] on 10/7 Plan for HD on M/W/F  LLL pneumonia Currently afebrile, with persistent leukocytosis Currently on room air, procalcitonin 1.08 Completed 5 days of IV ceftriaxone  and 3 days of azithromycin  Uncontrolled hypertension/ Hypertensive urgency BP improved Continue norvasc  10 mg daily and hydralazine 50 mg TID  Anemia sec to ESRD and iron  deficiency anemia Anemia panel showing iron  deficiency  S/p IV iron  load X 5 days, on darbepoetin weekly S/p 1 unit of prbc transfusion Hemoglobin remained stable  Hypokalemia/hypomagnesemia Likely 2/2 short gut syndrome Replace as needed  Possible UTI UA shows large leukocytes with many bacteria. Urine cultures showing insignificant growth.  Blood cultures NGTD Completed 5 days of IV ceftriaxone   Dyspnea Appears to have improved.  BNP is 1769 Echo shows LVEF of 70 to 75% , hyperdynamic function. Indeterminate diastolic filling  due to E-A fusion.  Troponins are mildly elevated possibly from demand ischemia from  ESRD, metabolic acidosis, and pneumonia.   Short gut syndrome/ Crohn's disease S/p total colectomy, with right ileostomy  Painful lesions on the labia suspicious for genital herpes Possible cellulitis of left external labia CT abd and pelvis show Inflammation in the subcutaneous fat of the region of the left mons pubis extending into the left upper labial region. Findings are consistent with cellulitis and not associated with focal abscess or subcutaneous gas. Continue valacyclovir , doxycycline  x 10 days, received a dose of diflucan   Gout Resume allopurinol   Class I obesity Body mass index is 31.95 kg/m. Lifestyle modification advised     Estimated body mass index is 31.95 kg/m as calculated from the following:   Height as of this encounter: 5' 3 (1.6 m).   Weight as of this encounter: 81.8 kg.  Code Status: full code DVT Prophylaxis:  heparin  injection 5,000 Units Start: 06/13/24 0600   Level of Care: Level of care: Telemetry Medical Family Communication: none at bedside.   Disposition Plan:     Remains inpatient appropriate: Level of care  Procedures:  [facility]  Consultants:   Nephrology  Vascular surgery  Antimicrobials:   Anti-infectives (From admission, onward)    Start     Dose/Rate Route Frequency Ordered Stop  06/11/24 1015  doxycycline  (VIBRA -TABS) tablet 100 mg        100 mg Oral Every 12 hours 06/11/24 0929 06/16/24 0959   06/09/24 1730  fluconazole  (DIFLUCAN ) tablet 200 mg        200 mg Oral  Once 06/09/24 1632 06/09/24 1900   06/08/24 2200  cefTRIAXone  (ROCEPHIN ) 1 g in sodium chloride  0.9 % 100 mL IVPB        1 g 200 mL/hr over 30 Minutes Intravenous Every 24 hours 06/07/24 2223 06/13/24 0813   06/08/24 1000  azithromycin (ZITHROMAX) tablet 500 mg  Status:  Discontinued        500 mg Oral Daily 06/07/24 2223 06/11/24 0929   06/08/24 1000  valACYclovir  (VALTREX ) tablet 500 mg        500 mg Oral Daily 06/07/24 2307 06/17/24 0959   06/07/24  2245  valACYclovir  (VALTREX ) tablet 500 mg  Status:  Discontinued        500 mg Oral Daily 06/07/24 2232 06/07/24 2307   06/07/24 2115  cefTRIAXone  (ROCEPHIN ) 1 g in sodium chloride  0.9 % 100 mL IVPB        1 g 200 mL/hr over 30 Minutes Intravenous  Once 06/07/24 2107 06/07/24 2255   06/07/24 2115  azithromycin (ZITHROMAX) tablet 500 mg        500 mg Oral  Once 06/07/24 2108 06/07/24 2203        Medications  Scheduled Meds:  sodium chloride    Intravenous Once   allopurinol   150 mg Oral Q2000   amLODipine   10 mg Oral Daily   calcitRIOL   0.25 mcg Oral Daily   Chlorhexidine  Gluconate Cloth  6 each Topical Daily   darbepoetin (ARANESP) injection - DIALYSIS  60 mcg Subcutaneous Q Sat-1800   doxycycline   100 mg Oral Q12H   DULoxetine   60 mg Oral Daily   heparin   5,000 Units Subcutaneous Q8H   hydrALAZINE  50 mg Oral Q8H   Influenza vac split trivalent PF  0.5 mL Intramuscular Tomorrow-1000   metoprolol  succinate  100 mg Oral QHS   mupirocin ointment  1 Application Nasal BID   valACYclovir   500 mg Oral Daily   Continuous Infusions:   PRN Meds:.acetaminophen , guaiFENesin-dextromethorphan, hydrALAZINE, ipratropium-albuterol , melatonin, polyethylene glycol, prochlorperazine , sodium chloride  flush    Subjective:   Patient denies any new complaints.  Reports labial pain has improved as well as swelling.  Patient denies any chest pain, shortness of breath, abdominal pain, nausea/vomiting, fever/chills.  Objective:   Vitals:   06/13/24 1700 06/13/24 1730 06/13/24 1800 06/13/24 1830  BP: 97/65 106/62 107/69 113/68  Pulse: 88 81 79 78  Resp: 20 15 13 17   Temp:      TempSrc:      SpO2: 99% 94% 95% 97%  Weight:      Height:        Intake/Output Summary (Last 24 hours) at 06/13/2024 1844 Last data filed at 06/13/2024 0251 Gross per 24 hour  Intake --  Output 0 ml  Net 0 ml   Filed Weights   06/12/24 1108 06/12/24 1409 06/13/24 1601  Weight: 79.5 kg 79.5 kg 81.8 kg    General: NAD  Cardiovascular: S1, S2 present Respiratory: CTAB Abdomen: Soft, nontender, nondistended, bowel sounds present, s/p right-sided ileostomy Musculoskeletal: No bilateral pedal edema noted Skin: Normal Psychiatry: Normal mood       Data Reviewed:  I have personally reviewed following labs and imaging studies   CBC Lab Results  Component Value Date  WBC 13.4 (H) 06/12/2024   RBC 2.80 (L) 06/12/2024   HGB 9.1 (L) 06/12/2024   HCT 28.6 (L) 06/12/2024   MCV 102.1 (H) 06/12/2024   MCH 32.5 06/12/2024   PLT 320 06/12/2024   MCHC 31.8 06/12/2024   RDW 16.0 (H) 06/12/2024   LYMPHSABS 2.2 06/11/2024   MONOABS 1.0 06/11/2024   EOSABS 0.5 06/11/2024   BASOSABS 0.1 06/11/2024     Last metabolic panel Lab Results  Component Value Date   NA 136 06/12/2024   K 3.4 (L) 06/12/2024   CL 98 06/12/2024   CO2 25 06/12/2024   [name] 23 06/12/2024   CREATININE 3.48 (H) 06/12/2024   GLUCOSE 137 (H) 06/12/2024   GFRNONAA 14 (L) 06/12/2024   GFRAA 24 (L) 08/16/2018   CALCIUM  7.4 (L) 06/12/2024   PHOS 2.9 06/12/2024   PROT 6.7 06/09/2024   ALBUMIN 2.5 (L) 06/09/2024   BILITOT 0.7 06/09/2024   ALKPHOS 122 06/09/2024   AST 25 06/09/2024   ALT 22 06/09/2024   ANIONGAP 13 06/12/2024    CBG (last 3)  No results for input(s): GLUCAP in the last 72 hours.    Coagulation Profile: Recent Labs  Lab 06/07/24 1853  INR 1.2     Radiology Studies: DG C-Arm 1-60 Min Result Date: 06/12/2024 CLINICAL DATA:  Right upper extremity dialysis catheter placement EXAM: DG C-ARM 1-60 MIN FLUOROSCOPY: Fluoroscopy Time:  18.2 seconds Radiation Exposure Index (if provided by the fluoroscopic device): 2.30 mGy Number of Acquired Spot Images: 1 COMPARISON:  None Available. FINDINGS: One fluoroscopic image obtained during dialysis catheter placement. 18.2 seconds fluoro time utilized. Radiation dose 2.30 mGy Kerma. Please see performing physicians operative report for full details.  IMPRESSION: Fluoroscopic images were obtained for intraoperative guidance of dialysis catheter placement. Electronically Signed   By: Limin  Xu M.D.   On: 06/12/2024 15:58   DG Chest Port 1 View Result Date: 06/12/2024 CLINICAL DATA:  Status post catheter placement. EXAM: PORTABLE CHEST 1 VIEW COMPARISON:  June 07, 2024. FINDINGS: The heart size and mediastinal contours are within normal limits. Both lungs are clear. Right internal jugular Port-A-Cath is unchanged. Interval placement of left internal jugular dialysis catheter with distal tip in right atrium. No pneumothorax is noted. The visualized skeletal structures are unremarkable. IMPRESSION: Interval placement of left internal jugular dialysis catheter with distal tip in right atrium. Electronically Signed   By: Lynwood Landy Raddle M.D.   On: 06/12/2024 11:37       Lebron JINNY Cage M.D. [facility] 06/13/2024, 6:44 PM  Available via Epic secure chat 7am-7pm After 7 pm, please refer to night coverage provider listed on amion.

## 2024-06-13 NOTE — Procedures (Signed)
 Post Hemodialysis Note:   Received patient in bed to dialysis unit. Alert and oriented. Informed consent signed and in chart. Tolerated treatment well, no adverse effects noted.   2.5 hour dialysis treatment time.2nd dialysis tx   Access used: Left HD CVC Access issues: None       Total UF removed: No fluid Medications given:  None

## 2024-06-13 NOTE — Discharge Planning (Signed)
 [facility]  Initial Hemodialysis Orders  Dialysis center: [facility]  Patient's name: [name] DOB: [birth year] AKI or ESRD: ESRD  Discharge diagnosis: CKD 5 now progressed to ESRD Possible UTI - on ABXs per PCP CAP - on ABXs per PCP HSV - on PO Valtrex  (dose id appropriate) Short-gut syndrome/ Crohn's disease - hx of total colectomy, has an ileostomy and has been getting IVFs at a local infusion center 3x per week to prevent dehydration. Appears we've been ordering no UF here per notes Hypokalemia   Allergies:  Allergies  Allergen Reactions   Sulfa Antibiotics Hives, Itching, Rash and Swelling   Morphine Other (See Comments)    Gives me crazy dreams (delusions, also) Psychosis, also    Sulfonamide Derivatives Hives, Itching and Swelling    Date of First Dialysis: 06/12/24 Cause of renal disease: HTN and Sepsis/PNA  Dialysis Prescription: Dialysis Frequency: MWF Tx duration: 3hrs then 3.5hrs then 4hrs  BFR: 300 then 350 then 400  DFR: Auto 1.5 EDW: 79.5kg  Dialyzer: 180NRe UF profile/Sodium modeling?: None Dialysis Bath: 4 K/2.5 Ca  Dialysis access: L internal jugular TDC placed 06/12/24 by Dr. Sheree Access type: R AVF  Date placed: 06/12/24 Surgeon: Dr. Sheree *AVF is maturing*   In Center Medications: Heparin  Dose: 2500 units  Type: Bolus VDRA: Calcitriol  0.25mcg with HD for now Venofer : Per protocol Mircera: Per protocol. Doesn't look like ESA was given here yet.  Discharge labs: Hgb: 9.1  K+: 3.4  Ca: 7.4  Phos: 2.9  Alb: 2.5 from 10/4  Please draw routine labs. Additional labs needed: Per facility protocol  Please note: If we don't have 4K available, we need to order it. Use 3K bath while we wait. Check weekly K+ levels while on high K bath   Charmaine Piety, NP

## 2024-06-13 NOTE — Plan of Care (Signed)
 Nutrition Education Note  RD consulted for Renal/new dialysis Education. Provided Nutrition for People on Dialysis to patient. Explained why diet restrictions (fluid, potassium, sodium, and phosphorus) may be necessary while on dialysis depending on clinicals status and lab values.   Discussed importance of protein intake at each meal and snack. Provided examples of how to maximize protein intake throughout the day.   Encouraged pt to discuss specific diet questions/concerns with RD at HD outpatient facility. Teach back method used.  Expect good compliance.  Body mass index is 31.05 kg/m. Pt meets criteria for obesity, class I based on current BMI.  Current diet order is renal, 1200 ml fluid, patient is consuming approximately 100% of meals at this time. Labs and medications reviewed. No further nutrition interventions warranted at this time. RD contact information provided. If additional nutrition issues arise, please re-consult RD.  Jeanette Puga, MS, RD, LDN Clinical Dietitian  Contact via secure chat. If unavailable, use group chat RD Inpatient.

## 2024-06-14 ENCOUNTER — Other Ambulatory Visit (HOSPITAL_COMMUNITY): Payer: Self-pay

## 2024-06-14 DIAGNOSIS — J189 Pneumonia, unspecified organism: Secondary | ICD-10-CM | POA: Diagnosis not present

## 2024-06-14 LAB — CBC WITH DIFFERENTIAL/PLATELET
Abs Immature Granulocytes: 0.36 K/uL — ABNORMAL HIGH (ref 0.00–0.07)
Basophils Absolute: 0.1 K/uL (ref 0.0–0.1)
Basophils Relative: 0 %
Eosinophils Absolute: 0.1 K/uL (ref 0.0–0.5)
Eosinophils Relative: 1 %
HCT: 28.4 % — ABNORMAL LOW (ref 36.0–46.0)
Hemoglobin: 8.7 g/dL — ABNORMAL LOW (ref 12.0–15.0)
Immature Granulocytes: 2 %
Lymphocytes Relative: 16 %
Lymphs Abs: 2.8 K/uL (ref 0.7–4.0)
MCH: 32.3 pg (ref 26.0–34.0)
MCHC: 30.6 g/dL (ref 30.0–36.0)
MCV: 105.6 fL — ABNORMAL HIGH (ref 80.0–100.0)
Monocytes Absolute: 1 K/uL (ref 0.1–1.0)
Monocytes Relative: 6 %
Neutro Abs: 12.5 K/uL — ABNORMAL HIGH (ref 1.7–7.7)
Neutrophils Relative %: 75 %
Platelets: 279 K/uL (ref 150–400)
RBC: 2.69 MIL/uL — ABNORMAL LOW (ref 3.87–5.11)
RDW: 16 % — ABNORMAL HIGH (ref 11.5–15.5)
WBC: 16.8 K/uL — ABNORMAL HIGH (ref 4.0–10.5)
nRBC: 0 % (ref 0.0–0.2)

## 2024-06-14 LAB — RENAL FUNCTION PANEL
Albumin: 2.4 g/dL — ABNORMAL LOW (ref 3.5–5.0)
Anion gap: 12 (ref 5–15)
BUN: 25 mg/dL — ABNORMAL HIGH (ref 8–23)
CO2: 25 mmol/L (ref 22–32)
Calcium: 8.3 mg/dL — ABNORMAL LOW (ref 8.9–10.3)
Chloride: 99 mmol/L (ref 98–111)
Creatinine, Ser: 2.67 mg/dL — ABNORMAL HIGH (ref 0.44–1.00)
GFR, Estimated: 19 mL/min — ABNORMAL LOW (ref >60)
Glucose, Bld: 128 mg/dL — ABNORMAL HIGH (ref 70–99)
Phosphorus: 3.2 mg/dL (ref 2.5–4.6)
Potassium: 4.2 mmol/L (ref 3.5–5.1)
Sodium: 136 mmol/L (ref 135–145)

## 2024-06-14 LAB — MAGNESIUM: Magnesium: 2.7 mg/dL — ABNORMAL HIGH (ref 1.7–2.4)

## 2024-06-14 MED ORDER — HYDRALAZINE HCL 50 MG PO TABS
50.0000 mg | ORAL_TABLET | Freq: Three times a day (TID) | ORAL | 0 refills | Status: DC
  Filled 2024-06-14: qty 90, 30d supply, fill #0

## 2024-06-14 MED ORDER — VALACYCLOVIR HCL 500 MG PO TABS
500.0000 mg | ORAL_TABLET | Freq: Every day | ORAL | 0 refills | Status: AC
  Filled 2024-06-14: qty 3, 3d supply, fill #0

## 2024-06-14 MED ORDER — DOXYCYCLINE HYCLATE 100 MG PO TABS
100.0000 mg | ORAL_TABLET | Freq: Two times a day (BID) | ORAL | 0 refills | Status: AC
  Filled 2024-06-14: qty 13, 7d supply, fill #0

## 2024-06-14 MED ORDER — AMLODIPINE BESYLATE 5 MG PO TABS
5.0000 mg | ORAL_TABLET | Freq: Every day | ORAL | 0 refills | Status: DC
  Filled 2024-06-14: qty 30, 30d supply, fill #0

## 2024-06-14 MED ORDER — HEPARIN SOD (PORK) LOCK FLUSH 100 UNIT/ML IV SOLN
500.0000 [IU] | INTRAVENOUS | Status: AC | PRN
  Administered 2024-06-14: 500 [IU]

## 2024-06-14 MED ORDER — FLUCONAZOLE 150 MG PO TABS
150.0000 mg | ORAL_TABLET | Freq: Once | ORAL | Status: DC
  Filled 2024-06-14: qty 1

## 2024-06-14 NOTE — Discharge Summary (Addendum)
 Physician Discharge Summary   Patient: [name] MRN: [id number] DOB: Jun 04, 1956  Admit date:     06/07/2024  Discharge date: 06/14/24  Discharge Physician: [name]   PCP: Jeanette Cyndee Jamee JONELLE, DO   Recommendations at discharge:   Follow-up with PCP in 1 week Follow-up with nephrology as scheduled  Discharge Diagnoses: Principal Problem:   CAP (community acquired pneumonia)   Hospital Course: 68 y.o. 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  She is being treated for LLL pneumonia and advanced CKD.   Today, patient denies any new complaints.  Noted chronic leukocytosis, may need to follow-up with hematology as an outpatient.  Currently, patient denies any fever/chills, worsening cough, chest pain, abdominal pain.  Noted herpetic lesions, with no active drainage or purulent discharge, or redness.   Assessment and Plan:  Progressed to ESRD Nephrology on board, initiated HD on 10/7 Vascular surgery placed [facility] on 10/7 Plan for outpatient HD on M/W/F   LLL pneumonia Currently afebrile, with chronic leukocytosis Currently on room air, denies any shortness of breath or chest pain Completed 5 days of IV ceftriaxone  and 3 days of azithromycin  Chronic leukocytosis PCP to follow-up, and arrange outpatient follow-up with hematology   Uncontrolled hypertension/ Hypertensive urgency BP improved Continue norvasc  5 mg daily, hydralazine 50 mg TID, metoprolol  100 mg at bedtime Outpatient follow-up with PCP   Anemia sec to ESRD and iron  deficiency anemia Anemia panel showing iron   deficiency  S/p IV iron  load X 5 days, on darbepoetin weekly S/p 1 unit of prbc transfusion Hemoglobin remained stable Outpatient follow-up   Hypokalemia/hypomagnesemia Likely 2/2 short gut syndrome Replaced as needed   Possible UTI UA shows large leukocytes with many bacteria. Urine cultures showing insignificant growth.  Blood cultures NGTD Completed 5 days of IV ceftriaxone    Dyspnea Appears to have improved.  BNP is 1769 Echo shows LVEF of 70 to 75% , hyperdynamic function. Indeterminate diastolic filling  due to E-A fusion.  Troponins are mildly elevated possibly from demand ischemia from ESRD, metabolic acidosis, and pneumonia.    Short gut syndrome/ Crohn's disease S/p total colectomy, with right ileostomy   Painful lesions on the labia suspicious for genital herpes Possible cellulitis of left external labia CT abd and pelvis show Inflammation in the subcutaneous fat of the region of the left mons pubis extending into the left upper labial region. Findings are consistent with cellulitis and not associated with focal abscess or subcutaneous gas. Continue valacyclovir , doxycycline  x 10 days, received 2 doses of diflucan    Gout Resume allopurinol    Class I obesity Body mass index is 31.95 kg/m. Lifestyle modification advised       Consultants: Nephrology, vascular surgery Procedures performed: TDC placed Disposition: Home Diet recommendation:  Renal diet   DISCHARGE MEDICATION: Allergies as of 06/14/2024       Reactions   Sulfa Antibiotics Hives, Itching, Rash, Swelling   Morphine Other (See Comments)   Gives me crazy dreams (delusions, also) Psychosis, also   Sulfonamide Derivatives Hives, Itching, Swelling        Medication List     STOP taking these medications    apixaban  5 MG  Tabs tablet Commonly known as: ELIQUIS    dicyclomine  10 MG capsule Commonly known as: BENTYL    folic acid  1 MG tablet Commonly known as: FOLVITE    Gas-X Extra  Strength 125 MG Caps Generic drug: Simethicone    Metamucil Premium Blend 52.63 % Powd Generic drug: Psyllium   OVER THE COUNTER MEDICATION   TUMS EXTRA STRENGTH 750 PO       TAKE these medications    acetaminophen  500 MG tablet Commonly known as: TYLENOL  Take 1,000 mg by mouth every 6 (six) hours as needed (for pain/sleep).   allopurinol  300 MG tablet Commonly known as: ZYLOPRIM  TAKE 1/2 TABLET (150 mg total) BY MOUTH DAILY AT 9PM   amLODipine  5 MG tablet Commonly known as: NORVASC  Take 1 tablet (5 mg total) by mouth daily.   calcitRIOL  0.25 MCG capsule Commonly known as: ROCALTROL  Take 0.25 mcg by mouth daily.   doxycycline  100 MG tablet Commonly known as: VIBRA -TABS Take 1 tablet (100 mg total) by mouth every 12 (twelve) hours for 13 doses.   DULoxetine  60 MG capsule Commonly known as: CYMBALTA  TAKE ONE CAPSULE (60 MG TOTAL) BY MOUTH DAILY AT 9AM (PATIENT NEEDS FURTHER EVALUATION AND/OR LABORATORY TESTING BEFORE FURTHER REFILLS ARE GIVEN)   hydrALAZINE 50 MG tablet Commonly known as: APRESOLINE Take 1 tablet (50 mg total) by mouth every 8 (eight) hours.   metoprolol  succinate 100 MG 24 hr tablet Commonly known as: TOPROL -XL Take 100 mg by mouth at bedtime.   octreotide  20 MG injection Commonly known as: SandoSTATIN  LAR [place] Inject 20 mg into the muscle every 28 (twenty-eight) days.   valACYclovir  500 MG tablet Commonly known as: VALTREX  Take 1 tablet (500 mg total) by mouth daily for 3 days. Start taking on: June 15, 2024   Vitamin D  (Ergocalciferol ) 1.25 MG (50000 UNIT) Caps capsule Commonly known as: DRISDOL  Take 50,000 Units by mouth every 30 (thirty) days.        Follow-up Information     Jeanette Cyndee Jamee JONELLE, DO. Schedule an appointment as soon as possible for a visit in 1 week(s).   Specialty: Family Medicine Contact information: [address] [place] [address] [phone]                Discharge  Exam: [name]   06/13/24 1601 06/13/24 1847 06/14/24 0439  Weight: 81.8 kg 81.8 kg 80.4 kg   General: NAD  Cardiovascular: S1, S2 present Respiratory: CTAB Abdomen: Soft, nontender, nondistended, bowel sounds present Musculoskeletal: No bilateral pedal edema noted Skin: Normal Psychiatry: Normal mood   Condition at discharge: stable  The results of significant diagnostics from this hospitalization (including imaging, microbiology, ancillary and laboratory) are listed below for reference.   Imaging Studies: DG C-Arm 1-60 Min Result Date: 06/12/2024 CLINICAL DATA:  Right upper extremity dialysis catheter placement EXAM: DG C-ARM 1-60 MIN FLUOROSCOPY: Fluoroscopy Time:  18.2 seconds Radiation Exposure Index (if provided by the fluoroscopic device): 2.30 mGy Number of Acquired Spot Images: 1 COMPARISON:  None Available. FINDINGS: One fluoroscopic image obtained during dialysis catheter placement. 18.2 seconds fluoro time utilized. Radiation dose 2.30 mGy Kerma. Please see performing physicians operative report for full details. IMPRESSION: Fluoroscopic images were obtained for intraoperative guidance of dialysis catheter placement. Electronically Signed   By: Limin  Xu M.D.   On: 06/12/2024 15:58   DG Chest Port 1 View Result Date: 06/12/2024 CLINICAL DATA:  Status post catheter placement. EXAM: PORTABLE CHEST 1 VIEW COMPARISON:  June 07, 2024. FINDINGS:  The heart size and mediastinal contours are within normal limits. Both lungs are clear. Right internal jugular Port-A-Cath is unchanged. Interval placement of left internal jugular dialysis catheter with distal tip in right atrium. No pneumothorax is noted. The visualized skeletal structures are unremarkable. IMPRESSION: Interval placement of left internal jugular dialysis catheter with distal tip in right atrium. Electronically Signed   By: Lynwood Landy Raddle M.D.   On: 06/12/2024 11:37   CT ABDOMEN PELVIS WO CONTRAST Result Date:  06/10/2024 CLINICAL DATA:  Lower abdominal and pelvic pain. EXAM: CT ABDOMEN AND PELVIS WITHOUT CONTRAST TECHNIQUE: Multidetector CT imaging of the abdomen and pelvis was performed following the standard protocol without IV contrast. RADIATION DOSE REDUCTION: This exam was performed according to the departmental dose-optimization program which includes automated exposure control, adjustment of the mA and/or kV according to patient size and/or use of iterative reconstruction technique. COMPARISON:  05/28/2023 FINDINGS: Lower chest: Stable emphysematous lung disease. Hepatobiliary: No focal liver abnormality is seen. Status post cholecystectomy. No biliary dilatation. Pancreas: Unremarkable. No pancreatic ductal dilatation or surrounding inflammatory changes. Spleen: Normal in size without focal abnormality. Adrenals/Urinary Tract: No adrenal masses. Atrophic kidneys without hydronephrosis, visible lesion or calculi. The bladder is unremarkable. Stomach/Bowel: Stable appearance status post prior colectomy and ileostomy with stable appearance the right lower quadrant ileostomy. No bowel obstruction, ileus, free air or inflammatory process identified. Stable small bowel anastomosis in the anterior pelvis without evidence of anastomotic leak or abscess. Vascular/Lymphatic: Mild atherosclerosis of the abdominal aorta without aneurysm. No lymphadenopathy identified. Reproductive: Status post hysterectomy. No adnexal masses. Other: No ascites. Inflammation in the subcutaneous fat of the region of the left mons pubis extending into the left upper labial region. Findings are consistent with cellulitis and not associated with focal abscess or subcutaneous gas. Musculoskeletal: Degenerative disc disease of the lumbar spine with associated mild rightward convex scoliosis. IMPRESSION: 1. Inflammation in the subcutaneous fat of the region of the left mons pubis extending into the left upper labial region. Findings are consistent  with cellulitis and not associated with focal abscess or subcutaneous gas. 2. Stable appearance status post prior colectomy and ileostomy with stable appearance of the right lower quadrant ileostomy. No bowel obstruction, ileus, free air or inflammatory process identified. 3. Stable emphysematous lung disease. Electronically Signed   By: Marcey Moan M.D.   On: 06/10/2024 11:38   ECHOCARDIOGRAM COMPLETE Result Date: 06/08/2024    ECHOCARDIOGRAM REPORT   Patient Name:   [facility] YASHIKA MASK Date of Exam: 06/08/2024 Medical Rec #:  [id number]       Height:       63.0 in Accession #:    [id number]      Weight:       171.0 lb Date of Birth:  July 30, 1956       BSA:          1.809 m Patient Age:    68 years        BP:           139/71 mmHg Patient Gender: F               HR:           101 bpm. Exam Location:  Inpatient Procedure: 2D Echo, Cardiac Doppler and Color Doppler (Both Spectral and Color            Flow Doppler were utilized during procedure). Indications:    Elevated troponin R79.89  History:  Patient has no prior history of Echocardiogram examinations.                 Signs/Symptoms:Shortness of Breath; Risk Factors:Hypertension.                 H/O Pulmonary nodule, Chronic kidney disease, Weakness,                 Hyperlipidemia.  Sonographer:    BERNARDA ROCKS Referring Phys: [id number] CAROLE N HALL IMPRESSIONS  1. Left ventricular ejection fraction, by estimation, is 70 to 75%. The left ventricle has hyperdynamic function. The left ventricle has no regional wall motion abnormalities. Indeterminate diastolic filling due to E-A fusion.  2. Right ventricular systolic function is normal. The right ventricular size is normal.  3. The mitral valve is normal in structure. Mild mitral valve regurgitation.  4. The aortic valve is tricuspid. Aortic valve regurgitation is mild.  5. The inferior vena cava is normal in size with greater than 50% respiratory variability, suggesting right atrial pressure of 3 mmHg.  FINDINGS  Left Ventricle: Left ventricular ejection fraction, by estimation, is 70 to 75%. The left ventricle has hyperdynamic function. The left ventricle has no regional wall motion abnormalities. The left ventricular internal cavity size was normal in size. There is no left ventricular hypertrophy. Indeterminate diastolic filling due to E-A fusion. Right Ventricle: The right ventricular size is normal. Right vetricular wall thickness was not assessed. Right ventricular systolic function is normal. Left Atrium: Left atrial size was normal in size. Right Atrium: Right atrial size was normal in size. Pericardium: There is no evidence of pericardial effusion. Mitral Valve: The mitral valve is normal in structure. Mild mitral valve regurgitation. MV peak gradient, 12.2 mmHg. The mean mitral valve gradient is 6.0 mmHg. Tricuspid Valve: The tricuspid valve is normal in structure. Tricuspid valve regurgitation is trivial. Aortic Valve: The aortic valve is tricuspid. Aortic valve regurgitation is mild. Aortic regurgitation PHT measures 357 msec. Aortic valve mean gradient measures 7.0 mmHg. Aortic valve peak gradient measures 12.5 mmHg. Aortic valve area, by VTI measures 2.62 cm. Pulmonic Valve: The pulmonic valve was normal in structure. Pulmonic valve regurgitation is not visualized. Aorta: The aortic root and ascending aorta are structurally normal, with no evidence of dilitation. Venous: The inferior vena cava is normal in size with greater than 50% respiratory variability, suggesting right atrial pressure of 3 mmHg. IAS/Shunts: No atrial level shunt detected by color flow Doppler.  LEFT VENTRICLE PLAX 2D LVIDd:         4.40 cm      Diastology LVIDs:         2.50 cm      LV e' medial:    8.89 cm/s LV PW:         0.90 cm      LV E/e' medial:  13.7 LV IVS:        0.80 cm      LV e' lateral:   14.70 cm/s LVOT diam:     1.80 cm      LV E/e' lateral: 8.3 LV SV:         90 LV SV Index:   50 LVOT Area:     2.54 cm  LV  Volumes (MOD) LV vol d, MOD A2C: 106.0 ml LV vol d, MOD A4C: 117.0 ml LV vol s, MOD A2C: 33.0 ml LV vol s, MOD A4C: 40.6 ml LV SV MOD A2C:     73.0 ml LV SV MOD  A4C:     117.0 ml LV SV MOD BP:      75.2 ml RIGHT VENTRICLE             IVC RV Basal diam:  2.60 cm     IVC diam: 1.40 cm RV S prime:     21.10 cm/s TAPSE (M-mode): 2.3 cm RVSP:           25.7 mmHg LEFT ATRIUM             Index        RIGHT ATRIUM           Index LA diam:        2.80 cm 1.55 cm/m   RA Pressure: 3.00 mmHg LA Vol (A2C):   45.3 ml 25.04 ml/m  RA Area:     10.30 cm LA Vol (A4C):   39.8 ml 22.00 ml/m  RA Volume:   21.80 ml  12.05 ml/m LA Biplane Vol: 45.8 ml 25.32 ml/m  AORTIC VALVE                     PULMONIC VALVE AV Area (Vmax):    2.62 cm      PV Vmax:          1.50 m/s AV Area (Vmean):   2.63 cm      PV Peak grad:     9.0 mmHg AV Area (VTI):     2.62 cm      PR End Diast Vel: 3.01 msec AV Vmax:           177.00 cm/s AV Vmean:          119.000 cm/s AV VTI:            0.345 m AV Peak Grad:      12.5 mmHg AV Mean Grad:      7.0 mmHg LVOT Vmax:         182.00 cm/s LVOT Vmean:        123.000 cm/s LVOT VTI:          0.355 m LVOT/AV VTI ratio: 1.03 AI PHT:            357 msec  AORTA Ao Root diam: 2.80 cm Ao Asc diam:  3.20 cm MITRAL VALVE                TRICUSPID VALVE MV Area (PHT): 9.03 cm     TR Peak grad:   22.7 mmHg MV Area VTI:   3.05 cm     TR Vmax:        238.00 cm/s MV Peak grad:  12.2 mmHg    Estimated RAP:  3.00 mmHg MV Mean grad:  6.0 mmHg     RVSP:           25.7 mmHg MV Vmax:       1.75 m/s MV Vmean:      112.0 cm/s   SHUNTS MV Decel Time: 84 msec      Systemic VTI:  0.36 m MR Peak grad: 126.3 mmHg    Systemic Diam: 1.80 cm MR Vmax:      562.00 cm/s MV E velocity: 122.00 cm/s MV A velocity: 156.00 cm/s MV E/A ratio:  0.78 Vina Gull MD Electronically signed by Vina Gull MD Signature Date/Time: 06/08/2024/5:13:36 PM    Final    DG Chest Port 1 View Result Date: 06/07/2024 CLINICAL DATA:  Questionable sepsis - evaluate  for abnormality EXAM: PORTABLE  CHEST 1 VIEW COMPARISON:  Chest x-ray 09/13/2023, CT chest 02/15/2023 FINDINGS: Right chest wall Port-A-Cath with tip at the superior cavoatrial junction. The heart and mediastinal contours are within normal limits. Question left base airspace opacity. No pulmonary edema. No pleural effusion. No pneumothorax. No acute osseous abnormality. IMPRESSION: Question left base airspace opacity. Recommend repeat chest x-ray PA and lateral view for further evaluation. Electronically Signed   By: Morgane  Naveau M.D.   On: 06/07/2024 19:22    Microbiology: Results for orders placed or performed during the hospital encounter of 06/07/24  Blood Culture (routine x 2)     Status: None   Collection Time: 06/07/24  7:30 PM   Specimen: BLOOD  Result Value Ref Range Status   Specimen Description   Final    BLOOD RIGHT ANTECUBITAL Performed at [facility], [place], [address] [place], [place] [address]    Special Requests   Final    BOTTLES DRAWN AEROBIC AND ANAEROBIC Blood Culture adequate volume Performed at [facility], [address], [place], [place] [address]    Culture   Final    NO GROWTH 5 DAYS Performed at [facility], [address] [place], [place] [address]    Report Status 06/13/2024 FINAL  Final  Resp panel by RT-PCR (RSV, Flu A&B, Covid) Anterior Nasal Swab     Status: None   Collection Time: 06/07/24  7:34 PM   Specimen: Anterior Nasal Swab  Result Value Ref Range Status   SARS Coronavirus 2 by RT PCR NEGATIVE NEGATIVE Final    Comment: (NOTE) SARS-CoV-2 target nucleic acids are NOT DETECTED.  The SARS-CoV-2 RNA is generally detectable in upper respiratory specimens during the acute phase of infection. The lowest concentration of SARS-CoV-2 viral copies this assay can detect is 138 copies/mL. A negative result does not preclude SARS-Cov-2 infection and should not be used as the sole basis for treatment or other  patient management decisions. A negative result may occur with  improper specimen collection/handling, submission of specimen other than nasopharyngeal swab, presence of viral mutation(s) within the areas targeted by this assay, and inadequate number of viral copies(<138 copies/mL). A negative result must be combined with clinical observations, patient history, and epidemiological information. The expected result is Negative.  Fact Sheet for Patients:  BloggerCourse.com  Fact Sheet for Healthcare Providers:  SeriousBroker.it  This test is no t yet approved or cleared by the [place]  FDA and  has been authorized for detection and/or diagnosis of SARS-CoV-2 by FDA under an Emergency Use Authorization (EUA). This EUA will remain  in effect (meaning this test can be used) for the duration of the COVID-19 declaration under Section 564(b)(1) of the Act, 21 U.S.C.section 360bbb-3(b)(1), unless the authorization is terminated  or revoked sooner.       Influenza A by PCR NEGATIVE NEGATIVE Final   Influenza B by PCR NEGATIVE NEGATIVE Final    Comment: (NOTE) The Xpert Xpress SARS-CoV-2/FLU/RSV plus assay is intended as an aid in the diagnosis of influenza from Nasopharyngeal swab specimens and should not be used as a sole basis for treatment. Nasal washings and aspirates are unacceptable for Xpert Xpress SARS-CoV-2/FLU/RSV testing.  Fact Sheet for Patients: BloggerCourse.com  Fact Sheet for Healthcare Providers: SeriousBroker.it  This test is not yet approved or cleared by the [place]  FDA and has been authorized for detection and/or diagnosis of SARS-CoV-2 by FDA under an Emergency Use Authorization (EUA). This EUA will remain in effect (meaning  this test can be used) for the duration of the COVID-19 declaration under Section 564(b)(1) of the Act, 21 U.S.C. section  360bbb-3(b)(1), unless the authorization is terminated or revoked.     Resp Syncytial Virus by PCR NEGATIVE NEGATIVE Final    Comment: (NOTE) Fact Sheet for Patients: BloggerCourse.com  Fact Sheet for Healthcare Providers: SeriousBroker.it  This test is not yet approved or cleared by the [place]  FDA and has been authorized for detection and/or diagnosis of SARS-CoV-2 by FDA under an Emergency Use Authorization (EUA). This EUA will remain in effect (meaning this test can be used) for the duration of the COVID-19 declaration under Section 564(b)(1) of the Act, 21 U.S.C. section 360bbb-3(b)(1), unless the authorization is terminated or revoked.  Performed at [facility], [address] [place], [place] [address]   Blood Culture (routine x 2)     Status: None   Collection Time: 06/07/24 10:00 PM   Specimen: BLOOD  Result Value Ref Range Status   Specimen Description   Final    BLOOD BLOOD LEFT FOREARM Performed at [facility], [address], [place], [place] [address]    Special Requests   Final    Blood Culture adequate volume BOTTLES DRAWN AEROBIC ONLY Performed at [facility], [address], [place], [place] [address]    Culture   Final    NO GROWTH 5 DAYS Performed at [facility], [address] [place], [place] [address]    Report Status 06/13/2024 FINAL  Final  Urine Culture     Status: Abnormal   Collection Time: 06/08/24  8:30 AM   Specimen: Urine, Random  Result Value Ref Range Status   Specimen Description   Final    URINE, RANDOM Performed at [facility], [address] [place], [place] [address]    Special Requests   Final    NONE Reflexed from [id number] Performed at [facility], [address], [place], [place] [address]    Culture (A)  Final    <10,000 COLONIES/mL INSIGNIFICANT  GROWTH Performed at [facility], [address], [place], [place] [address]    Report Status 06/09/2024 FINAL  Final  Surgical pcr screen     Status: Abnormal   Collection Time: 06/10/24  7:32 AM   Specimen: Nasal Mucosa; Nasal Swab  Result Value Ref Range Status   MRSA, PCR NEGATIVE NEGATIVE Final   Staphylococcus aureus POSITIVE (A) NEGATIVE Final    Comment: (NOTE) The Xpert SA Assay (FDA approved for NASAL specimens in patients 7 years of age and older), is one component of a comprehensive surveillance program. It is not intended to diagnose infection nor to guide or monitor treatment. Performed at [facility], [address] [place], [place] [address]    *Note: Due to a large number of results and/or encounters for the requested time period, some results have not been displayed. A complete set of results can be found in Results Review.    Labs: CBC: Recent Labs  Lab 06/07/24 1853 06/08/24 0527 06/09/24 0830 06/09/24 2347 06/10/24 0500 06/11/24 0444 06/12/24 0323 06/14/24 0352  WBC 19.8*   < > 13.5*  --  12.1* 13.0* 13.4* 16.8*  NEUTROABS 15.4*  --  10.5*  --  8.3* 9.0*  --  12.5*  HGB 7.9*   < > 7.0* 8.6* 8.7* 9.0* 9.1* 8.7*  HCT 26.7*   < > 20.9* 25.6* 26.5*  27.3* 28.6* 28.4*  MCV 105.1*   < > 98.1  --  96.4 97.8 102.1* 105.6*  PLT 357   < > 319  --  338 327 320 279   < > = values in this interval not displayed.   Basic Metabolic Panel: Recent Labs  Lab 06/08/24 0527 06/08/24 1858 06/09/24 0830 06/09/24 2347 06/10/24 0500 06/11/24 0444 06/12/24 0323 06/14/24 0352  NA 140 137 138 138 140 135 136 136  K 3.1* 3.5 2.4* 2.4* 2.4* 2.4* 3.4* 4.2  CL 118* 112* 109 105 104 96* 98 99  CO2 <7* 10* 15* 19* 22 28 25 25   GLUCOSE 119* 186* 175* 143* 147* 153* 137* 128*  BUN 51* 51* 41* 36* 33* 23 23 25*  CREATININE 4.57* 4.63* 4.53* 4.44* 4.30* 3.71* 3.48* 2.67*  CALCIUM  6.7* 8.3* 8.0* 7.9* 7.8* 7.2* 7.4* 8.3*  MG 0.9* 2.5* 1.7  --   --  1.0* 1.2*  2.7*  PHOS 5.2*  --   --   --   --   --  2.9 3.2   Liver Function Tests: Recent Labs  Lab 06/07/24 1853 06/09/24 0830 06/14/24 0352  AST 24 25  --   ALT 22 22  --   ALKPHOS 174* 122  --   BILITOT 0.4 0.7  --   PROT 7.6 6.7  --   ALBUMIN 3.8 2.5* 2.4*   CBG: No results for input(s): GLUCAP in the last 168 hours.  Discharge time spent: greater than 30 minutes.  Signed: Lebron JINNY Cage, MD [facility] 06/14/2024

## 2024-06-14 NOTE — Progress Notes (Addendum)
 Contacted by out-pt HD [facility] requesting Vascular report, op note and progress note faxed at this time. Will continue to assist as needed.   Ketan Renz Dialysis Navigator  Addendum 1209 D/c orders noted. Contacted out-pt HD clinic to inform of d/c and to anticipate pt for 1st appt tmrrw, as scheduled. Notified Nephrology PA to send orders. No further support needed at this time.

## 2024-06-14 NOTE — TOC Transition Note (Signed)
 Transition of Care [facility]) - Discharge Note   Patient Details  Name: [name] MRN: [id number] Date of Birth: [birth year]  Transition of Care [facility]) CM/SW Contact:  Tom-Johnson, Harvest Muskrat, RN Phone Number: 06/14/2024, 1:06 PM   Clinical Narrative:     Patient is scheduled for discharge today.  Readmission Risk Assessment done. Outpatient f/u, hospital f/u and discharge instructions on AVS. Prescriptions sent to [facility] pharmacy and patient will receive meds prior discharge. PT updated disposition recommendation to no f/u.  No further ICM needs or recommendations noted. Daughter, Delon to transport at discharge.  No further ICM needs noted.       Final next level of care: Home/Self Care Barriers to Discharge: Barriers Resolved   Patient Goals and CMS Choice Patient states their goals for this hospitalization and ongoing recovery are:: To return home CMS Medicare.gov Compare Post Acute Care list provided to:: Patient        Discharge Placement                Patient to be transferred to facility by: Daughter Name of family member notified: [facility]    Discharge Plan and Services Additional resources added to the After Visit Summary for                  DME Arranged: N/A DME Agency: NA       HH Arranged: NA HH Agency: NA        Social Drivers of Health (SDOH) Interventions SDOH Screenings   Food Insecurity: No Food Insecurity (06/08/2024)  Housing: Low Risk  (06/08/2024)  Transportation Needs: No Transportation Needs (06/08/2024)  Utilities: Not At Risk (06/08/2024)  Alcohol Screen: Low Risk  (05/22/2024)  Depression (PHQ2-9): High Risk (05/03/2024)  Financial Resource Strain: Low Risk  (05/22/2024)  Physical Activity: Insufficiently Active (05/22/2024)  Social Connections: Socially Isolated (06/08/2024)  Stress: No Stress Concern Present (05/22/2024)  Tobacco Use: Medium Risk (06/12/2024)  Health Literacy: Adequate Health Literacy (05/22/2024)      Readmission Risk Interventions    06/14/2024    1:04 PM 05/30/2023    3:51 PM 01/12/2023   10:41 AM  Readmission Risk Prevention Plan  Transportation Screening Complete Complete Complete  PCP or Specialist Appt within 5-7 Days   Complete  PCP or Specialist Appt within 3-5 Days Complete Complete   Home Care Screening   Complete  Medication Review (RN CM)   Complete  HRI or Home Care Consult Complete Complete   Social Work Consult for Recovery Care Planning/Counseling Complete Complete   Palliative Care Screening Not Applicable Not Applicable   Medication Review Oceanographer) Referral to Pharmacy Complete

## 2024-06-14 NOTE — Progress Notes (Signed)
 [facility] Progress Note  Subjective:  Had 2nd HD yesterday afternoon - no issues. For d/c today  Vitals:   06/13/24 1847 06/13/24 2002 06/14/24 0439 06/14/24 0857  BP: 114/70 121/77 116/64 110/67  Pulse: 81 82 79 72  Resp: 15 15 19 18   Temp: 97.7 F (36.5 C) 98.2 F (36.8 C) 98.3 F (36.8 C) (!) 97.5 F (36.4 C)  TempSrc: Oral Oral Oral   SpO2: 96% 100% 97% 100%  Weight: 81.8 kg  80.4 kg   Height:        Exam: Gen alert, no distress Sclera anicteric, throat clear  No jvd or bruits Chest clear bilat to bases RRR no MRG Abd soft ntnd no mass or ascites +bs Ext no LE or UE edema, no other edema Neuro is alert, Ox 3 , nf, mild asterixis TDC c/d/I, R AVF upper arm incision c/d/I with swelling/ecchymosis just distal to incision c/w hematoma - not increasing, 2+ radial pulse   Home bp meds: Toprol  xl 100 every day   Date                 Creat               eGFR (ml/min) 2008- 2016 1.4- 2.5 2017- 2018 2.0- 2.6 2019- 2020 1.9- 2.4 2021                2.10- 4.94 2022- 2023 2.10- 6.61 2024                2.70- 4.00                                             Jan 2025         3.56                 12 ml/min                     Aug 2025         4.97                 8 ml/min                               06/07/24          5.00                 9                                  06/08/24          4.57                 10 ml/min                       Assessment/ Plan: CKD 5 now ESRD:  w/ worsening anion gap and metabolic acidosis. Admitted for possible PNA, sepsis. She has advanced CKD 5 f/b Dr Rayburn at [facility] w/ uremic symptoms including fatigue, loss of appetite and more recently confusion. Has initiated HD as of 10/7.  S/p TDC and AVF 10/7.  Has MWF at [facility] arranged.  Anemia ckd: tsat 9%, ferritin 291. IV iron  load ordered as  venofer  200mg  daily x 5.  Starting also darbe 60 mcg weekly for now. 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   No UF with HD and hopefully can just managing IVF needs with HD now. CAP: on abx per pmd Possible UTI: on abx per pmd Hypokalemia: being supplemented po and IV - use 4K dialysate with HD for now, cont supplements PRN. 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  Manuelita Barters MD [facility] Pager [phone]   Recent Labs  Lab 06/09/24 0830 06/09/24 2347 06/12/24 0323 06/14/24 0352  HGB 7.0*   < > 9.1* 8.7*  ALBUMIN 2.5*  --   --  2.4*  CALCIUM  8.0*   < > 7.4* 8.3*  PHOS  --   --  2.9 3.2  CREATININE 4.53*   < > 3.48* 2.67*  K 2.4*   < > 3.4* 4.2   < > = values in this interval not displayed.   Recent Labs  Lab 06/08/24 1858  IRON  20*  TIBC 232*  FERRITIN 291   Inpatient medications:  sodium chloride    Intravenous Once   allopurinol   150 mg Oral Q2000   amLODipine   10 mg Oral Daily   calcitRIOL   0.25 mcg Oral Daily   Chlorhexidine  Gluconate Cloth  6 each Topical Daily   darbepoetin (ARANESP) injection - DIALYSIS  60 mcg Subcutaneous Q Sat-1800   doxycycline   100 mg Oral Q12H   DULoxetine   60 mg Oral Daily   heparin   5,000 Units Subcutaneous Q8H   hydrALAZINE  50 mg Oral Q8H   Influenza vac split trivalent PF  0.5 mL Intramuscular Tomorrow-1000   metoprolol  succinate  100 mg Oral QHS   mupirocin ointment  1 Application Nasal BID   valACYclovir   500 mg Oral Daily     acetaminophen , guaiFENesin-dextromethorphan, hydrALAZINE, ipratropium-albuterol , melatonin, polyethylene glycol, prochlorperazine , sodium chloride  flush

## 2024-06-15 ENCOUNTER — Telehealth: Payer: Self-pay | Admitting: *Deleted

## 2024-06-15 ENCOUNTER — Telehealth: Payer: Self-pay | Admitting: Nephrology

## 2024-06-15 ENCOUNTER — Ambulatory Visit

## 2024-06-15 NOTE — Telephone Encounter (Signed)
 Patient has been approved for [employer] assistance. Sandostatin  will now come from Cover my meds.  Patient is receiving Advance Medication - Supplied Externally. Medication: Sandostatin  LAR Manufacture: [employer] Approval Dates: Approved from 03/30/2024 until 09/05/2024. ID: [id number] Reason: OOP

## 2024-06-15 NOTE — Telephone Encounter (Signed)
 Transition of care contact from inpatient facility  Date of Discharge: 06/14/24 Date of Contact: 06/15/24 Method of contact: Phone  Attempted to contact patient to discuss transition of care from inpatient admission. Patient did not answer the phone. Message was left on the patient's voicemail. Will continue outreach attempts.

## 2024-06-15 NOTE — Transitions of Care (Post Inpatient/ED Visit) (Signed)
   06/15/2024  Name: [name] MRN: [id number] DOB: 1956-08-12  Today's TOC FU Call Status: Today's TOC FU Call Status:: Unsuccessful Call (1st Attempt) Unsuccessful Call (1st Attempt) Date: 06/15/24  Attempted to reach the patient regarding the most recent Inpatient/ED visit.  Follow Up Plan: Additional outreach attempts will be made to reach the patient to complete the Transitions of Care (Post Inpatient/ED visit) call.   Andrea Dimes RN, BSN [place]  [facility] RN Care Manager [phone]

## 2024-06-18 ENCOUNTER — Ambulatory Visit

## 2024-06-18 ENCOUNTER — Telehealth: Payer: Self-pay

## 2024-06-18 NOTE — Transitions of Care (Post Inpatient/ED Visit) (Signed)
   06/18/2024  Name: [name] MRN: [id number] DOB: 1956/02/13  Today's TOC FU Call Status: Today's TOC FU Call Status:: Unsuccessful Call (2nd Attempt) Unsuccessful Call (1st Attempt) Date: 06/15/24 Unsuccessful Call (2nd Attempt) Date: 06/18/24  Attempted to reach the patient regarding the most recent Inpatient/ED visit.  Follow Up Plan: Additional outreach attempts will be made to reach the patient to complete the Transitions of Care (Post Inpatient/ED visit) call.   Medford Balboa, BSN, RN [place]  [facility] - [employer] [facility] RN Care Manager [phone]

## 2024-06-19 ENCOUNTER — Telehealth: Payer: Self-pay

## 2024-06-19 ENCOUNTER — Other Ambulatory Visit: Payer: Self-pay | Admitting: Vascular Surgery

## 2024-06-19 DIAGNOSIS — N186 End stage renal disease: Secondary | ICD-10-CM

## 2024-06-19 NOTE — Transitions of Care (Post Inpatient/ED Visit) (Signed)
   06/19/2024  Name: [name] MRN: [id number] DOB: 1955-12-25  Today's TOC FU Call Status: Today's TOC FU Call Status:: Unsuccessful Call (3rd Attempt) Unsuccessful Call (1st Attempt) Date: 06/15/24 Unsuccessful Call (2nd Attempt) Date: 06/18/24 Unsuccessful Call (3rd Attempt) Date: 06/19/24  Attempted to reach the patient regarding the most recent Inpatient/ED visit.  Follow Up Plan: No further outreach attempts will be made at this time. We have been unable to contact the patient.  Medford Balboa, BSN, RN [place]  [facility] [employer] [facility] RN Care Manager [phone]

## 2024-06-20 ENCOUNTER — Telehealth: Payer: Self-pay | Admitting: Gastroenterology

## 2024-06-20 ENCOUNTER — Ambulatory Visit

## 2024-06-20 NOTE — Telephone Encounter (Signed)
 Inbound call from [facility] specialty pharmacy requesting f/u call at [phone] In regards to a medication to verify if patient will continue taking. Please advise.

## 2024-06-21 NOTE — Telephone Encounter (Signed)
 Thanks,  I will f/u

## 2024-06-21 NOTE — Telephone Encounter (Signed)
 Returned call to Psychologist, occupational,  Spoke to [facility]. They last sent Sandostatin  on 06-14-24 to the infusion center located on [address]  They received notification that the patient would be getting the medication from the manufacturer from now on and they were to stop sending the medication for her.  However, the patient has approval for financial hardship with complete coverage thru January 2026. They are wanting to confirm if they should continue to send the medication or cancel. the Pharmacist can be reached at [phone] , ext [phone].

## 2024-06-22 ENCOUNTER — Ambulatory Visit

## 2024-06-22 MED ORDER — OCTREOTIDE ACETATE 20 MG IM KIT
20.0000 mg | PACK | Freq: Once | INTRAMUSCULAR | Status: AC
  Filled 2024-06-22: qty 1

## 2024-06-25 ENCOUNTER — Ambulatory Visit

## 2024-06-27 ENCOUNTER — Ambulatory Visit

## 2024-06-29 ENCOUNTER — Ambulatory Visit

## 2024-07-02 ENCOUNTER — Ambulatory Visit

## 2024-07-03 NOTE — Telephone Encounter (Signed)
 Belaria with [facility] is again calling to inquire it they can d/c sandostatin  rx. States that were originally told patient would be getting this at the infusion center from the manufacturer instead. Please call to advise at [phone].

## 2024-07-04 ENCOUNTER — Ambulatory Visit

## 2024-07-06 ENCOUNTER — Ambulatory Visit

## 2024-07-11 ENCOUNTER — Ambulatory Visit (HOSPITAL_COMMUNITY)

## 2024-07-11 ENCOUNTER — Ambulatory Visit (HOSPITAL_COMMUNITY): Attending: Vascular Surgery | Admitting: Vascular Surgery

## 2024-07-12 ENCOUNTER — Ambulatory Visit (HOSPITAL_BASED_OUTPATIENT_CLINIC_OR_DEPARTMENT_OTHER)
Admission: RE | Admit: 2024-07-12 | Discharge: 2024-07-12 | Disposition: A | Discharge status: Home / Self Care | Source: Ambulatory Visit | Attending: Family Medicine | Admitting: Family Medicine

## 2024-07-12 ENCOUNTER — Ambulatory Visit: Payer: Self-pay

## 2024-07-12 ENCOUNTER — Ambulatory Visit: Admitting: Family Medicine

## 2024-07-12 ENCOUNTER — Ambulatory Visit: Payer: Self-pay | Admitting: Family Medicine

## 2024-07-12 ENCOUNTER — Encounter: Payer: Self-pay | Admitting: Family Medicine

## 2024-07-12 VITALS — BP 102/70 | HR 94 | Temp 98.5°F | Resp 16 | Ht 63.0 in | Wt 174.2 lb

## 2024-07-12 DIAGNOSIS — W19XXXA Unspecified fall, initial encounter: Secondary | ICD-10-CM | POA: Diagnosis not present

## 2024-07-12 DIAGNOSIS — A6000 Herpesviral infection of urogenital system, unspecified: Secondary | ICD-10-CM | POA: Diagnosis not present

## 2024-07-12 DIAGNOSIS — K50819 Crohn's disease of both small and large intestine with unspecified complications: Secondary | ICD-10-CM | POA: Diagnosis not present

## 2024-07-12 DIAGNOSIS — S2232XA Fracture of one rib, left side, initial encounter for closed fracture: Secondary | ICD-10-CM | POA: Insufficient documentation

## 2024-07-12 DIAGNOSIS — D72829 Elevated white blood cell count, unspecified: Secondary | ICD-10-CM

## 2024-07-12 DIAGNOSIS — T560X1S Toxic effect of lead and its compounds, accidental (unintentional), sequela: Secondary | ICD-10-CM

## 2024-07-12 DIAGNOSIS — D509 Iron deficiency anemia, unspecified: Secondary | ICD-10-CM | POA: Diagnosis not present

## 2024-07-12 DIAGNOSIS — M1A10X0 Lead-induced chronic gout, unspecified site, without tophus (tophi): Secondary | ICD-10-CM

## 2024-07-12 DIAGNOSIS — Z8701 Personal history of pneumonia (recurrent): Secondary | ICD-10-CM | POA: Insufficient documentation

## 2024-07-12 DIAGNOSIS — R300 Dysuria: Secondary | ICD-10-CM

## 2024-07-12 DIAGNOSIS — N184 Chronic kidney disease, stage 4 (severe): Secondary | ICD-10-CM | POA: Diagnosis not present

## 2024-07-12 LAB — CBC WITH DIFFERENTIAL/PLATELET
Basophils Absolute: 0.1 K/uL (ref 0.0–0.1)
Basophils Relative: 0.9 % (ref 0.0–3.0)
Eosinophils Absolute: 0.2 K/uL (ref 0.0–0.7)
Eosinophils Relative: 2.7 % (ref 0.0–5.0)
HCT: 32.3 % — ABNORMAL LOW (ref 36.0–46.0)
Hemoglobin: 10.5 g/dL — ABNORMAL LOW (ref 12.0–15.0)
Lymphocytes Relative: 18.1 % (ref 12.0–46.0)
Lymphs Abs: 1.6 K/uL (ref 0.7–4.0)
MCHC: 32.6 g/dL (ref 30.0–36.0)
MCV: 104.5 fl — ABNORMAL HIGH (ref 78.0–100.0)
Monocytes Absolute: 0.6 K/uL (ref 0.1–1.0)
Monocytes Relative: 6.9 % (ref 3.0–12.0)
Neutro Abs: 6.3 K/uL (ref 1.4–7.7)
Neutrophils Relative %: 71.4 % (ref 43.0–77.0)
Platelets: 262 K/uL (ref 150.0–400.0)
RBC: 3.09 Mil/uL — ABNORMAL LOW (ref 3.87–5.11)
RDW: 18.2 % — ABNORMAL HIGH (ref 11.5–15.5)
WBC: 8.9 K/uL (ref 4.0–10.5)

## 2024-07-12 LAB — COMPREHENSIVE METABOLIC PANEL WITH GFR
ALT: 27 U/L (ref 0–35)
AST: 26 U/L (ref 0–37)
Albumin: 4 g/dL (ref 3.5–5.2)
Alkaline Phosphatase: 128 U/L — ABNORMAL HIGH (ref 39–117)
BUN: 30 mg/dL — ABNORMAL HIGH (ref 6–23)
CO2: 27 meq/L (ref 19–32)
Calcium: 9.1 mg/dL (ref 8.4–10.5)
Chloride: 99 meq/L (ref 96–112)
Creatinine, Ser: 3.71 mg/dL — ABNORMAL HIGH (ref 0.40–1.20)
GFR: 11.83 mL/min — CL (ref >60.00)
Glucose, Bld: 123 mg/dL — ABNORMAL HIGH (ref 70–99)
Potassium: 4 meq/L (ref 3.5–5.1)
Sodium: 139 meq/L (ref 135–145)
Total Bilirubin: 0.5 mg/dL (ref 0.2–1.2)
Total Protein: 7.2 g/dL (ref 6.0–8.3)

## 2024-07-12 NOTE — Progress Notes (Signed)
 Patient arrived in the imaging department for a follow up chest xray after being discharged from the hospital. Staff brought the patient into the xray room via wheelchair.  Patient advised that she was able to stand for her xray.  Staff member asked patient to take in a deep breath for her chest xray, after the exposure patient informed the staff member she was dizzy and the staff member ran out to help her back in the wheelchair, patient fell on the floor hitting her left posterior ribs.  ED staff was notified and came to the department to assess the patient - Dr. CANDIE Pereyra, Grayce Grave Charge RN, [facility] Paramedic, and Quintin Bevelyn PEAK. Patient was assisted back to the wheelchair and complained of left posterior rib pain, patient's vitals were taken and were within normal range for her.  Staff asked the patient if she would like to be seen in the ED and she refused.  Dr. Antonio was called an notified of the fall and told about the left posterior rib pain and Dr. Antonio agreed to order xrays of the patient's left posterior ribs.  This technologist asked the patient again if she would like to be seen in the ED and the patient refused.  This technologist took patient to her car via wheelchair and assisted the patient into the car.  Patient stated that she was better and would be able to driver home.

## 2024-07-12 NOTE — Telephone Encounter (Signed)
 FYI Only or Action Required?: Action required by provider: lab or test result follow-up needed and Please see PCP Results Follow-Up note (07/12/24) for acknowledgement of labs.  Patient was last seen in primary care on 07/12/2024 by [name], [name], DO.  Called Nurse Triage reporting Results.   Triage Disposition: Call PCP Now  Patient/caregiver understands and will follow disposition?: Yes      Additional Information  Commented on: Lab or radiology calling with CRITICAL test results    Triager attempted to call [facility] On-Call x 2 but was unsuccessful.   Of note, PCP has acknowledged labs in the Results Follow-Up note.  Protocols used: PCP Call - No Triage-A-AH

## 2024-07-12 NOTE — Telephone Encounter (Addendum)
  Copied from CRM #[id number]. Topic: Clinical - Lab/Test Results >> Jul 12, 2024  5:10 PM Alfonso ORN wrote: Reason for CRM:  [facility] lab Critical lab Reason for Disposition  Lab or radiology calling with CRITICAL test results  Answer Assessment - Initial Assessment Questions 1. REASON FOR CALL or QUESTION: What is your reason for calling today? or How can I best     Critical Labs: 11.83 GFR 2. CALLER: Document the source of call. (e.g., laboratory staff, caregiver or patient).     Lab staff from [facility] [place], [place]  Protocols used: PCP Call - No Triage-A-AH

## 2024-07-12 NOTE — Progress Notes (Signed)
 Subjective:    Patient ID: [name], female    DOB: [birth year], 68 y.o.   MRN: [id number]  Chief Complaint  Patient presents with   Hospitalization Follow-up    Pneumonia and shingles follow up    HPI Patient is in today for f/u hosp for pneumonia and shingles.  Discussed the use of AI scribe software for clinical note transcription with the patient, who gave verbal consent to proceed.  History of Present Illness [name] is a 68 year old female with hypertension and chronic kidney disease who presents with fluctuating blood pressure and recent initiation of dialysis.  She experiences fluctuating blood pressure, with readings as low as 92/70 mmHg. Her blood pressure varies, and she is currently taking metoprolol  to manage her heart rate.  She started dialysis two weeks ago and has completed six to seven sessions. She feels better overall, with improvements in walking and mobility, although she still experiences fluctuations in her condition.  She has a recent history of shingles and pneumonia. She experienced severe pain on her left side for over two weeks, which was diagnosed as shingles. She also had cellulitis in the lower abdomen, confirmed by imaging studies, which drained and caused sores that are now healing.  She suspects a urinary tract infection due to pain at the end of urination. Despite being on dialysis, she still urinates, although inconsistently, with some normal urination and some dribbling.  She mentions the recent death of her long-term partner, [name], who passed away from blood clots in the lungs. She faces emotional and legal challenges following his death, including disputes with his son over property and assets.  She manages her fluid intake, restricted to 32 ounces a day, but often exceeds this due to thirst and the need to stay hydrated. No current cough or respiratory symptoms, reports improved breathing, and no longer has sores under her breast. She  reports back pain and occasional dizziness.    Past Medical History:  Diagnosis Date   Abdominal pain    Hx   Acute pyelonephritis 12/07/2020   Allergy    Anal stenosis    Anemia    Anxiety    Arthritis    Asthma    patient does not have inhaler   Blood in stool    Hx   Blood in urine    Hx   Blood transfusion without reported diagnosis    Cataract    CKD (chronic kidney disease) stage 3, GFR 30-59 ml/min (HCC)    Crohn's colitis, other complication (HCC)    De Quervain's tenosynovitis    Depression    Difficulty urinating    Hx   Easy bruising    Esophagitis    Fainting    History - resolved - due to dehydration   Fatigue    Hx   Fibroid    Hx   Gastric polyp    GERD (gastroesophageal reflux disease)    Hearing loss    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   Hemorrhoids, external    Hemorrhoids, internal    Herpes, genital    vaginal treated 07/05/12 and pt states is resolved   History of cervical dysplasia    History of small bowel obstruction    Hyperlipidemia    currently no meds   Hyperparathyroidism    Hypertension    Hypokalemia    Hx   Hypotension 06/29/2022   IBD (inflammatory bowel disease)  initially colectomy for suspected UC, 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   Obesity    Ovarian cyst    Pain in joint, pelvic region and thigh 12/16/2008   Centricity Description: HIP PAIN, LEFT  Qualifier: Diagnosis of   By: Antonio ROSALEA Rockers      Centricity Description: HIP PAIN  Qualifier: Diagnosis of   By: Antonio ROSALEA Rockers       Pain of right thumb 04/12/2018   Poor dental hygiene    Pulmonary nodule, right    right upper lobe   Rectal bleeding    Hx   Rectal pain    Hx   Renal insufficiency    CKD - stage 3   RLS (restless legs syndrome)    no meds   Sepsis secondary to UTI (HCC) 06/29/2022   Severe sepsis (HCC) 12/07/2020   Tooth infection 11/2016   right low   Ulcerative colitis     Visual disturbance    wears glasses   Weakness generalized    Hx - patient denies generalized weakness   Wears dentures    upper only    Past Surgical History:  Procedure Laterality Date   ANAL DILATION     AV FISTULA PLACEMENT Right 06/12/2024   Procedure: ARTERIOVENOUS (AV) FISTULA CREATION RIGHT ARM;  Surgeon: Sheree Penne Bruckner, MD;  Location: [facility] OR;  Service: Vascular;  Laterality: Right;   BIOPSY  07/20/2022   Procedure: BIOPSY;  Surgeon: Aneita Gwendlyn DASEN, MD;  Location: [facility];  Service: Gastroenterology;;   CERVICAL BIOPSY  W/ LOOP ELECTRODE EXCISION     CHOLECYSTECTOMY     COLONOSCOPY     [name]   fatty tumor removed from back     X 2   HEMORRHOID SURGERY     ILEOSCOPY N/A 07/20/2022   Procedure: ILEOSCOPY THROUGH STOMA;  Surgeon: Aneita Gwendlyn DASEN, MD;  Location: [facility];  Service: Gastroenterology;  Laterality: N/A;   ILEOSTOMY CLOSURE     INSERTION OF DIALYSIS CATHETER Left 06/12/2024   Procedure: INSERTION OF DIALYSIS CATHETER;  Surgeon: Sheree Penne Bruckner, MD;  Location: [facility] OR;  Service: Vascular;  Laterality: Left;  TUNNLELED   IR CV LINE INJECTION  05/30/2023   IR CV LINE INJECTION  08/02/2023   IR CV LINE INJECTION  12/05/2023   IR IMAGING GUIDED PORT INSERTION  04/12/2023   IR IMAGING GUIDED PORT INSERTION  12/22/2023   IR REMOVAL TUN ACCESS W/ PORT W/O FL MOD SED  12/22/2023   RESTORATIVE PROCTOCOLECTOMY     with insertion of ileoanal J Pouch with loop ileostomy   SHOULDER ARTHROSCOPY WITH CAPSULORRHAPHY Left 06/14/2019   Procedure: LEFT SHOULDER ARTHRSCOPIC REPAIR OF BONY BANKART FRACTURE;  Surgeon: Dozier Soulier, MD;  Location: [facility];  Service: Orthopedics;  Laterality: Left;  REQUEST 90 MINUTE   SIGMOIDOSCOPY     TOTAL ABDOMINAL HYSTERECTOMY  1998   TAH/LSO   TUBAL LIGATION     UPPER GASTROINTESTINAL ENDOSCOPY     [name]    Family History  Problem Relation Age of Onset   Hypertension Mother    Heart disease Mother        s/p  pci   Ulcerative colitis Father    Hypertension Father    Heart attack Father    Diabetes Sister    Cancer Sister        uterine   Pulmonary fibrosis Sister    Cancer Maternal Uncle  LUNG   Ulcerative colitis Daughter    Liver disease Daughter    Irritable bowel syndrome Other        grandchildren   Colon cancer Neg Hx    Esophageal cancer Neg Hx    Stomach cancer Neg Hx    Rectal cancer Neg Hx     Social History   Socioeconomic History   Marital status: Divorced    Spouse name: Not on file   Number of children: Not on file   Years of education: Not on file   Highest education level: Not on file  Occupational History   Not on file  Tobacco Use   Smoking status: Former    Current packs/day: 0.00    Average packs/day: 1 pack/day for 4.0 years (4.0 ttl pk-yrs)    Types: Cigarettes    Start date: 05/21/1975    Quit date: 05/21/1979    Years since quitting: 45.1   Smokeless tobacco: Never  Vaping Use   Vaping status: Never Used  Substance and Sexual Activity   Alcohol use: No    Alcohol/week: 0.0 standard drinks of alcohol   Drug use: No   Sexual activity: Yes    Birth control/protection: Post-menopausal    Comment: 1st intercourse 68 yo-Fewer than 5 partners  Other Topics Concern   Not on file  Social History Narrative   Not on file   Social Drivers of Health   Financial Resource Strain: Low Risk  (05/22/2024)   Overall Financial Resource Strain (CARDIA)    Difficulty of Paying Living Expenses: Not very hard  Food Insecurity: No Food Insecurity (06/08/2024)   Hunger Vital Sign    Worried About Running Out of Food in the Last Year: Never true    Ran Out of Food in the Last Year: Never true  Transportation Needs: No Transportation Needs (06/08/2024)   PRAPARE - Administrator, Civil Service (Medical): No    Lack of Transportation (Non-Medical): No  Physical Activity: Insufficiently Active (05/22/2024)   Exercise Vital Sign    Days of Exercise  per Week: 7 days    Minutes of Exercise per Session: 10 min  Stress: No Stress Concern Present (05/22/2024)   [employer] of Occupational Health - Occupational Stress Questionnaire    Feeling of Stress: Not at all  Social Connections: Socially Isolated (06/08/2024)   Social Connection and Isolation Panel    Frequency of Communication with Friends and Family: More than three times a week    Frequency of Social Gatherings with Friends and Family: More than three times a week    Attends Religious Services: Never    Database Administrator or Organizations: No    Attends Banker Meetings: Never    Marital Status: Widowed  Intimate Partner Violence: Not At Risk (06/08/2024)   Humiliation, Afraid, Rape, and Kick questionnaire    Fear of Current or Ex-Partner: No    Emotionally Abused: No    Physically Abused: No    Sexually Abused: No    Outpatient Medications Prior to Visit  Medication Sig Dispense Refill   acetaminophen  (TYLENOL ) 500 MG tablet Take 1,000 mg by mouth every 6 (six) hours as needed (for pain/sleep).     allopurinol  (ZYLOPRIM ) 300 MG tablet TAKE 1/2 TABLET (150 mg total) BY MOUTH DAILY AT 9PM 90 tablet 11   amLODipine  (NORVASC ) 5 MG tablet Take 1 tablet (5 mg total) by mouth daily. 30 tablet 0   calcitRIOL  (ROCALTROL ) 0.25 MCG  capsule Take 0.25 mcg by mouth daily.     DULoxetine  (CYMBALTA ) 60 MG capsule TAKE ONE CAPSULE (60 MG TOTAL) BY MOUTH DAILY AT 9AM (PATIENT NEEDS FURTHER EVALUATION AND/OR LABORATORY TESTING BEFORE FURTHER REFILLS ARE GIVEN) 60 capsule 11   hydrALAZINE (APRESOLINE) 50 MG tablet Take 1 tablet (50 mg total) by mouth every 8 (eight) hours. 90 tablet 0   metoprolol  succinate (TOPROL -XL) 100 MG 24 hr tablet Take 100 mg by mouth at bedtime.     octreotide  (SANDOSTATIN  LAR DEPOT) 20 MG injection Inject 20 mg into the muscle every 28 (twenty-eight) days. 1 each 6   Vitamin D , Ergocalciferol , (DRISDOL ) 1.25 MG (50000 UNIT) CAPS capsule Take 50,000  Units by mouth every 30 (thirty) days.     Facility-Administered Medications Prior to Visit  Medication Dose Route Frequency Provider Last Rate Last Admin   heparin  lock flush 100 unit/mL  500 Units Intracatheter Once PRN Rayburn Pac, MD       heparin  lock flush 100 unit/mL  250 Units Intracatheter Once PRN Rayburn Pac, MD       octreotide  (SANDOSTATIN  LAR) IM injection 20 mg  20 mg Intramuscular Once Armbruster, Elspeth SQUIBB, MD       sodium chloride  0.9 % bolus 1,000 mL  1,000 mL Intravenous Once Rayburn Pac, MD        Allergies  Allergen Reactions   Sulfa Antibiotics Hives, Itching, Rash and Swelling   Morphine Other (See Comments)    Gives me crazy dreams (delusions, also) Psychosis, also    Sulfonamide Derivatives Hives, Itching and Swelling    Review of Systems  Constitutional:  Negative for chills, fever and malaise/fatigue.  HENT:  Negative for congestion and hearing loss.   Eyes:  Negative for blurred vision and discharge.  Respiratory:  Negative for cough, sputum production and shortness of breath.   Cardiovascular:  Negative for chest pain, palpitations and leg swelling.  Gastrointestinal:  Negative for abdominal pain, blood in stool, constipation, diarrhea, heartburn, nausea and vomiting.  Genitourinary:  Negative for dysuria, frequency, hematuria and urgency.  Musculoskeletal:  Negative for back pain, falls and myalgias.  Skin:  Negative for rash.  Neurological:  Negative for dizziness, sensory change, loss of consciousness, weakness and headaches.  Endo/Heme/Allergies:  Negative for environmental allergies. Does not bruise/bleed easily.  Psychiatric/Behavioral:  Negative for depression and suicidal ideas. The patient is not nervous/anxious and does not have insomnia.        Objective:    Physical Exam Vitals and nursing note reviewed.  Constitutional:      General: She is not in acute distress.    Appearance: Normal appearance. She is  well-developed.  HENT:     Head: Normocephalic and atraumatic.  Eyes:     General: No scleral icterus.       Right eye: No discharge.        Left eye: No discharge.  Cardiovascular:     Rate and Rhythm: Normal rate and regular rhythm.     Heart sounds: No murmur heard. Pulmonary:     Effort: Pulmonary effort is normal. No respiratory distress.     Breath sounds: Normal breath sounds.  Musculoskeletal:        General: Normal range of motion.     Cervical back: Normal range of motion and neck supple.     Right lower leg: No edema.     Left lower leg: No edema.  Skin:    General: Skin is warm and dry.  Neurological:     General: No focal deficit present.     Mental Status: She is alert and oriented to person, place, and time.  Psychiatric:        Mood and Affect: Mood normal.        Behavior: Behavior normal.        Thought Content: Thought content normal.        Judgment: Judgment normal.     BP 102/70 (BP Location: Left Arm, Patient Position: Sitting, Cuff Size: Normal)   Pulse 94   Temp 98.5 F (36.9 C) (Oral)   Resp 16   Ht 5' 3 (1.6 m)   Wt 174 lb 3.2 oz (79 kg)   SpO2 95%   BMI 30.86 kg/m  Wt Readings from Last 3 Encounters:  07/12/24 174 lb 3.2 oz (79 kg)  06/14/24 177 lb 4 oz (80.4 kg)  06/06/24 174 lb (78.9 kg)    Diabetic Foot Exam - Simple   No data filed    Lab Results  Component Value Date   WBC 16.8 (H) 06/14/2024   HGB 8.7 (L) 06/14/2024   HCT 28.4 (L) 06/14/2024   PLT 279 06/14/2024   GLUCOSE 128 (H) 06/14/2024   CHOL 199 05/03/2024   TRIG 198.0 (H) 05/03/2024   HDL 41.70 05/03/2024   LDLDIRECT 109.0 08/03/2022   LDLCALC 118 (H) 05/03/2024   ALT 22 06/09/2024   AST 25 06/09/2024   NA 136 06/14/2024   K 4.2 06/14/2024   CL 99 06/14/2024   CREATININE 2.67 (H) 06/14/2024   BUN 25 (H) 06/14/2024   CO2 25 06/14/2024   TSH 0.965 06/09/2024   INR 1.2 06/07/2024   HGBA1C 5.6 08/23/2020    Lab Results  Component Value Date   TSH 0.965  06/09/2024   Lab Results  Component Value Date   WBC 16.8 (H) 06/14/2024   HGB 8.7 (L) 06/14/2024   HCT 28.4 (L) 06/14/2024   MCV 105.6 (H) 06/14/2024   PLT 279 06/14/2024   Lab Results  Component Value Date   NA 136 06/14/2024   K 4.2 06/14/2024   CO2 25 06/14/2024   GLUCOSE 128 (H) 06/14/2024   BUN 25 (H) 06/14/2024   CREATININE 2.67 (H) 06/14/2024   BILITOT 0.7 06/09/2024   ALKPHOS 122 06/09/2024   AST 25 06/09/2024   ALT 22 06/09/2024   PROT 6.7 06/09/2024   ALBUMIN 2.4 (L) 06/14/2024   CALCIUM  8.3 (L) 06/14/2024   ANIONGAP 12 06/14/2024   EGFR 11.0 03/07/2024   GFR 8.34 (LL) 05/03/2024   Lab Results  Component Value Date   CHOL 199 05/03/2024   Lab Results  Component Value Date   HDL 41.70 05/03/2024   Lab Results  Component Value Date   LDLCALC 118 (H) 05/03/2024   Lab Results  Component Value Date   TRIG 198.0 (H) 05/03/2024   Lab Results  Component Value Date   CHOLHDL 5 05/03/2024   Lab Results  Component Value Date   HGBA1C 5.6 08/23/2020       Assessment & Plan:  Genital herpes simplex, unspecified site  Stage 4 chronic kidney disease (HCC) -     Comprehensive metabolic panel with GFR  Iron  deficiency anemia, unspecified iron  deficiency anemia type -     Ambulatory referral to Hematology / Oncology -     CBC with Differential/Platelet  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 Mobility and overall  well-being have improved. She is on a fluid restriction of 32 ounces per day, which is insufficient. Temporary dialysis access is in place, with plans for permanent access in two surgeries. Monitor and adjust fluid intake as needed. Schedule follow-up for permanent dialysis access placement.  Blood pressure fluctuations in the setting of end-stage renal disease   Blood pressure fluctuations are likely related to dialysis and fluid management. Today's blood pressure is 92/70, with previous readings as low as 94/69. She reports dizziness and uses a wheelchair due to low blood pressure. Avoid blood pressure medication when blood pressure is low to prevent further hypotension. Monitor blood pressure closely and adjust fluid intake based on readings.  Iron  deficiency anemia   Iron  deficiency anemia was noted in a recent hospitalization. She is not on current medication for anemia. A follow-up with hematology is recommended but not yet scheduled. Will schedule a follow-up appointment with hematology.  Dysuria, possible urinary tract infection   She reports dysuria with pain at the end of urination, suggesting a possible urinary tract infection. She continues to urinate despite dialysis, with occasional normal urination. A urine sample was checked for infection. Increased fluid intake is encouraged.  Crohn's disease of small and large intestine   No specific discussion of Crohn's disease management in this encounter.  General Health Maintenance   There is some discussion of general health maintenance in this encounter, but it is brief and not the primary focus of the conversation.    Alby Schwabe R Lowne Chase, DO

## 2024-07-13 ENCOUNTER — Other Ambulatory Visit: Payer: Self-pay | Admitting: Family Medicine

## 2024-07-13 ENCOUNTER — Ambulatory Visit: Payer: Self-pay

## 2024-07-13 DIAGNOSIS — R0789 Other chest pain: Secondary | ICD-10-CM

## 2024-07-13 MED ORDER — TRAMADOL HCL 50 MG PO TABS: 50.0000 mg | ORAL_TABLET | Freq: Three times a day (TID) | ORAL | 0 refills | Status: AC | PRN

## 2024-07-13 NOTE — Telephone Encounter (Signed)
 FYI---rib pain from fall yesterday. Imaging not back yet

## 2024-07-13 NOTE — Telephone Encounter (Signed)
 Called [facility], patient requesting pain medication, spoke Jon; it's a 24-48 hour turn around and may not be until Monday.

## 2024-07-13 NOTE — Telephone Encounter (Signed)
 PCP aware

## 2024-07-13 NOTE — Telephone Encounter (Signed)
 Copied from CRM #[id number]. Topic: Clinical - Red Word Triage >> Jul 13, 2024  4:16 PM Lauren C wrote: Red Word that prompted transfer to Nurse Triage: Severe pain in side from a fall. Imaging results not back yet. She was triaged earlier today and felt like she was basically told to wait for imaging results, but she says the pain is so severe she needs something done today.

## 2024-07-13 NOTE — Telephone Encounter (Signed)
 Documented on previous triage note.

## 2024-07-13 NOTE — Telephone Encounter (Signed)
 FYI Only or Action Required?: Action required by provider: medication refill request. Pt is requesting pain medication for pain in ribs/side after passing out and falling yesterday at x-ray. Pt would also like provider to review imaging and call her back with interpretation.  Patient was last seen in primary care on 07/12/2024 by [name], [name], DO.  Called Nurse Triage reporting Chest Pain - rib pain  Symptoms began yesterday.  Interventions attempted: Nothing. - pt has x-ray after fall - to see if ribs were broken.  Symptoms are: unchanged.  Triage Disposition: Home Care  Patient/caregiver understands and will follow disposition?: Yes                                 Copied from CRM #[id number]. Topic: Clinical - Red Word Triage >> Jul 13, 2024  2:10 PM Rosina BIRCH wrote: Red Word that prompted transfer to Nurse Triage: patient called stating she was taking xrays yesterday downstairs and she passed out and she feel like she fractured a rib. They did three xrays of her left side but her side is still hurting when she breathes and moves Reason for Disposition  [1] Chest wall bruise, pain, or swelling AND [2] present < 7 days  Answer Assessment - Initial Assessment Questions 1. MECHANISM: How did the injury happen?     Passed out yesterday when told to hold her breath for x-ray of chest 2. ONSET: When did the injury happen? (.e.g., minutes, hours, days ago)     yesterday 3. LOCATION: Where on the chest is the injury located?     Left side 4. APPEARANCE: What does the injury look like?     No  change 5. BLEEDING: Is there any bleeding now? If Yes, ask: How long has it been bleeding?     no 6. SEVERITY: Any difficulty with breathing?     Yes - hurts with a deep breath 7. SIZE: For cuts, bruises, or swelling, ask: How large is it? (e.g., inches or centimeters)     none 8. PAIN: Is there pain? If Yes, ask: How bad is the pain? (e.g.,  Scale 0-10; none, mild, moderate, severe)     Yes - hurts a good deal with deep breath , moving  Protocols used: Chest Injury-A-AH

## 2024-07-13 NOTE — Telephone Encounter (Signed)
 Patient reports calling again and requesting pain medication.  Patient reports no difficulty breathing, dizziness, reports same pain, no worse symptoms.  Advised UC/ ED if symptoms.

## 2024-07-17 ENCOUNTER — Other Ambulatory Visit: Payer: Self-pay | Admitting: Medical Oncology

## 2024-07-17 ENCOUNTER — Other Ambulatory Visit

## 2024-07-17 ENCOUNTER — Other Ambulatory Visit: Payer: Self-pay | Admitting: Family Medicine

## 2024-07-17 ENCOUNTER — Ambulatory Visit: Payer: Self-pay | Admitting: Family Medicine

## 2024-07-17 ENCOUNTER — Encounter: Payer: Self-pay | Admitting: Medical Oncology

## 2024-07-17 ENCOUNTER — Other Ambulatory Visit: Payer: Self-pay

## 2024-07-17 ENCOUNTER — Inpatient Hospital Stay (HOSPITAL_BASED_OUTPATIENT_CLINIC_OR_DEPARTMENT_OTHER): Admitting: Medical Oncology

## 2024-07-17 ENCOUNTER — Inpatient Hospital Stay: Attending: Hematology & Oncology

## 2024-07-17 VITALS — BP 103/65 | HR 97 | Temp 97.9°F | Resp 20 | Ht 63.0 in | Wt 173.4 lb

## 2024-07-17 DIAGNOSIS — D500 Iron deficiency anemia secondary to blood loss (chronic): Secondary | ICD-10-CM

## 2024-07-17 DIAGNOSIS — I82622 Acute embolism and thrombosis of deep veins of left upper extremity: Secondary | ICD-10-CM

## 2024-07-17 DIAGNOSIS — R300 Dysuria: Secondary | ICD-10-CM

## 2024-07-17 LAB — IRON AND IRON BINDING CAPACITY (CC-WL,HP ONLY)
Iron: 130 ug/dL (ref 28–170)
Saturation Ratios: 28 % (ref 10.4–31.8)
TIBC: 469 ug/dL — ABNORMAL HIGH (ref 250–450)
UIBC: 339 ug/dL

## 2024-07-17 LAB — CBC
HCT: 35.8 % — ABNORMAL LOW (ref 36.0–46.0)
Hemoglobin: 11.8 g/dL — ABNORMAL LOW (ref 12.0–15.0)
MCH: 34.1 pg — ABNORMAL HIGH (ref 26.0–34.0)
MCHC: 33 g/dL (ref 30.0–36.0)
MCV: 103.5 fL — ABNORMAL HIGH (ref 80.0–100.0)
Platelets: 309 K/uL (ref 150–400)
RBC: 3.46 MIL/uL — ABNORMAL LOW (ref 3.87–5.11)
RDW: 16.3 % — ABNORMAL HIGH (ref 11.5–15.5)
WBC: 16.7 K/uL — ABNORMAL HIGH (ref 4.0–10.5)
nRBC: 0 % (ref 0.0–0.2)

## 2024-07-17 LAB — CMP (CANCER CENTER ONLY)
ALT: 22 U/L (ref 0–44)
AST: 25 U/L (ref 15–41)
Albumin: 4.7 g/dL (ref 3.5–5.0)
Alkaline Phosphatase: 165 U/L — ABNORMAL HIGH (ref 38–126)
Anion gap: 23 — ABNORMAL HIGH (ref 5–15)
BUN: 29 mg/dL — ABNORMAL HIGH (ref 8–23)
CO2: 19 mmol/L — ABNORMAL LOW (ref 22–32)
Calcium: 10.2 mg/dL (ref 8.9–10.3)
Chloride: 96 mmol/L — ABNORMAL LOW (ref 98–111)
Creatinine: 3.83 mg/dL — ABNORMAL HIGH (ref 0.44–1.00)
GFR, Estimated: 12 mL/min — ABNORMAL LOW (ref >60)
Glucose, Bld: 140 mg/dL — ABNORMAL HIGH (ref 70–99)
Potassium: 4.4 mmol/L (ref 3.5–5.1)
Sodium: 137 mmol/L (ref 135–145)
Total Bilirubin: 0.6 mg/dL (ref 0.0–1.2)
Total Protein: 8.9 g/dL — ABNORMAL HIGH (ref 6.5–8.1)

## 2024-07-17 LAB — POCT URINALYSIS DIPSTICK
Bilirubin, UA: NEGATIVE
Glucose, UA: NEGATIVE
Ketones, UA: NEGATIVE
Nitrite, UA: NEGATIVE
Protein, UA: POSITIVE — AB
Spec Grav, UA: 1.01 (ref 1.010–1.025)
Urobilinogen, UA: 0.2 U/dL
pH, UA: 6 (ref 5.0–8.0)

## 2024-07-17 LAB — FERRITIN: Ferritin: 799 ng/mL — ABNORMAL HIGH (ref 11–307)

## 2024-07-17 NOTE — Addendum Note (Signed)
 Addended by: Alejah Aristizabal M on: 07/17/2024 03:43 PM   Modules accepted: Orders

## 2024-07-17 NOTE — Addendum Note (Signed)
 Addended by: ELOUISE POWELL HERO on: 07/17/2024 11:44 AM   Modules accepted: Orders

## 2024-07-17 NOTE — Progress Notes (Signed)
 Hematology and Oncology Follow Up Visit  [name] [id number] 1955/09/10 68 y.o. 07/17/2024   Principle Diagnosis:  Recurrent iron  deficiency anemia secondary to ulcerative colitis Right cephalic/basilic vein thrombus - transiently positive lupus anticoagulant DVT left basilic vein diagnosed 01/27/2023   Current Therapy:        Eliquis  5 mg PO BID   Interim History:  [name] is here today for follow-up.   Today she states that she has not been well. She currently has a suspected UTI for which her PCP is treating her.  She is now off of the Eliquis  per her nephrologist due to her dialysis and kidney function and falls.  She reports no return of symptoms. She would prefer to not be on any type of anticoagulant at this time.  No fever, chills, n/v, cough, rash, dizziness, SOB, chest pain, palpitations, abdominal pain or changes in bowel or bladder habits.  She has not had any swelling or tenderness in her extremities.   ECOG Performance Status: 1 - Symptomatic but completely ambulatory  Medications:  Allergies as of 07/17/2024       Reactions   Sulfa Antibiotics Hives, Itching, Rash, Swelling   Morphine Other (See Comments)   Gives me crazy dreams (delusions, also) Psychosis, also   Sulfonamide Derivatives Hives, Itching, Swelling        Medication List        Accurate as of July 17, 2024 12:01 PM. If you have any questions, ask your nurse or doctor.          acetaminophen  500 MG tablet Commonly known as: TYLENOL  Take 1,000 mg by mouth every 6 (six) hours as needed (for pain/sleep).   allopurinol  300 MG tablet Commonly known as: ZYLOPRIM  TAKE 1/2 TABLET (150 mg total) BY MOUTH DAILY AT 9PM   amLODipine  5 MG tablet Commonly known as: NORVASC  Take 1 tablet (5 mg total) by mouth daily.   calcitRIOL  0.25 MCG capsule Commonly known as: ROCALTROL  Take 0.25 mcg by mouth daily.   DULoxetine  60 MG capsule Commonly known as: CYMBALTA  TAKE ONE CAPSULE  (60 MG TOTAL) BY MOUTH DAILY AT 9AM (PATIENT NEEDS FURTHER EVALUATION AND/OR LABORATORY TESTING BEFORE FURTHER REFILLS ARE GIVEN)   hydrALAZINE 50 MG tablet Commonly known as: APRESOLINE Take 1 tablet (50 mg total) by mouth every 8 (eight) hours.   metoprolol  succinate 100 MG 24 hr tablet Commonly known as: TOPROL -XL Take 100 mg by mouth at bedtime.   octreotide  20 MG injection Commonly known as: SandoSTATIN  LAR [place] Inject 20 mg into the muscle every 28 (twenty-eight) days.   traMADol 50 MG tablet Commonly known as: ULTRAM Take 1 tablet (50 mg total) by mouth every 8 (eight) hours as needed for up to 5 days.   Vitamin D  (Ergocalciferol ) 1.25 MG (50000 UNIT) Caps capsule Commonly known as: DRISDOL  Take 50,000 Units by mouth every 30 (thirty) days.        Allergies:  Allergies  Allergen Reactions   Sulfa Antibiotics Hives, Itching, Rash and Swelling   Morphine Other (See Comments)    Gives me crazy dreams (delusions, also) Psychosis, also    Sulfonamide Derivatives Hives, Itching and Swelling    Past Medical History, Surgical history, Social history, and Family History were reviewed and updated.  Review of Systems: All other 10 point review of systems is negative.   Physical Exam:  height is 5' 3 (1.6 m) and weight is 173 lb 6.4 oz (78.7 kg). Her oral temperature is 97.9  F (36.6 C). Her blood pressure is 103/65 and her pulse is 97. Her respiration is 20 and oxygen saturation is 95%.   Wt Readings from Last 3 Encounters:  07/17/24 173 lb 6.4 oz (78.7 kg)  07/12/24 174 lb 3.2 oz (79 kg)  06/14/24 177 lb 4 oz (80.4 kg)   Constitutional: Alert and oriented  Ocular: Sclerae unicteric, pupils equal, round and reactive to light Ear-nose-throat: Oropharynx clear, dentition fair Lymphatic: No cervical or supraclavicular adenopathy Lungs no rales or rhonchi, good excursion bilaterally Heart regular rate and rhythm, no murmur appreciated Abd soft, nontender, positive  bowel sounds MSK no focal spinal tenderness, no joint edema Neuro: non-focal, well-oriented, appropriate affect  Lab Results  Component Value Date   WBC 16.7 (H) 07/17/2024   HGB 11.8 (L) 07/17/2024   HCT 35.8 (L) 07/17/2024   MCV 103.5 (H) 07/17/2024   PLT 309 07/17/2024   Lab Results  Component Value Date   FERRITIN 291 06/08/2024   IRON  20 (L) 06/08/2024   TIBC 232 (L) 06/08/2024   UIBC 213 06/08/2024   IRONPCTSAT 9 (L) 06/08/2024   Lab Results  Component Value Date   RETICCTPCT 2.8 05/30/2023   RBC 3.46 (L) 07/17/2024   No results found for: KPAFRELGTCHN, LAMBDASER, KAPLAMBRATIO No results found for: IGGSERUM, IGA, IGMSERUM No results found for: STEPHANY CARLOTA BENSON MARKEL EARLA JOANNIE DOC, MSPIKE, SPEI   Chemistry      Component Value Date/Time   NA 139 07/12/2024 1055   NA 142 09/08/2017 0923   K 4.0 07/12/2024 1055   K 3.7 09/08/2017 0923   CL 99 07/12/2024 1055   CL 97 (L) 09/08/2017 0923   CO2 27 07/12/2024 1055   CO2 28 09/08/2017 0923   BUN 30 (H) 07/12/2024 1055   BUN 49 (H) 09/08/2017 0923   CREATININE 3.71 (H) 07/12/2024 1055   CREATININE 3.34 (H) 05/13/2023 1453   CREATININE 3.1 (HH) 09/08/2017 0923      Component Value Date/Time   CALCIUM  9.1 07/12/2024 1055   CALCIUM  9.9 09/08/2017 0923   CALCIUM  8.9 04/12/2013 0851   ALKPHOS 128 (H) 07/12/2024 1055   ALKPHOS 89 (H) 09/08/2017 0923   AST 26 07/12/2024 1055   AST 19 05/13/2023 1453   ALT 27 07/12/2024 1055   ALT 15 05/13/2023 1453   ALT 22 09/08/2017 0923   BILITOT 0.5 07/12/2024 1055   BILITOT 0.3 05/13/2023 1453      Encounter Diagnoses  Name Primary?   Arm DVT (deep venous thromboembolism), acute, left (HCC) Yes   Iron  deficiency anemia due to chronic blood loss     Impression and Plan: [name] is a very pleasant 68 yo caucasian female with iron  deficiency anemia secondary to intermittent GI blood loss/malabsorption with UC. She now has  an ileostomy and is doing better. She is now off of her Eliquis  per her nephrologist and dialysis team and pcp for falls. Currently has a suspected UTI being treated by PCP. She denies fevers, confusion or vomiting.   She is not interested in repeating an US  of the left arm at this time since she has not had any symptoms.  PCP to follow anemia.  We will see her yearly now that she is off of the Eliquis  and does not wish to be on any anticoagulant medication.  She will seek immediate medical treatment for any suspect clotting events   Lauraine CHRISTELLA Dais, PA-C 11/11/202512:01 PM

## 2024-07-19 ENCOUNTER — Other Ambulatory Visit: Payer: Self-pay | Admitting: Family Medicine

## 2024-07-19 ENCOUNTER — Ambulatory Visit: Payer: Self-pay | Admitting: Medical Oncology

## 2024-07-19 DIAGNOSIS — R300 Dysuria: Secondary | ICD-10-CM

## 2024-07-19 LAB — URINE CULTURE
MICRO NUMBER:: 17219574
SPECIMEN QUALITY:: ADEQUATE

## 2024-07-19 MED ORDER — CEPHALEXIN 500 MG PO CAPS: 500.0000 mg | ORAL_CAPSULE | Freq: Two times a day (BID) | ORAL | 0 refills | Status: DC

## 2024-08-16 ENCOUNTER — Ambulatory Visit (HOSPITAL_COMMUNITY): Admission: RE | Admit: 2024-08-16 | Discharge: 2024-08-16 | Attending: Vascular Surgery

## 2024-08-16 ENCOUNTER — Other Ambulatory Visit: Payer: Self-pay

## 2024-08-16 ENCOUNTER — Ambulatory Visit: Attending: Vascular Surgery

## 2024-08-16 ENCOUNTER — Encounter: Payer: Self-pay | Admitting: Physician Assistant

## 2024-08-16 VITALS — BP 127/79 | HR 120 | Temp 97.7°F | Wt 179.2 lb

## 2024-08-16 DIAGNOSIS — N186 End stage renal disease: Secondary | ICD-10-CM

## 2024-08-16 NOTE — Progress Notes (Signed)
 Office Note     CC:  follow up Requesting Provider:  [name], [name], DO  HPI: [name] is a 68 y.o. (1956/02/07) female who presents status post right arm basilic vein fistula creation on 06/12/2024 by Dr. Sheree.  This was her first access surgery.  She is currently dialyzing via left IJ TDC which was placed at the same time.  She denies steal symptoms in the right hand.  She believes her incision has healed well.  She dialyzes on a Monday Wednesday Friday schedule at the [facility] location in [place].   Past Medical History:  Diagnosis Date   Abdominal pain    Hx   Acute pyelonephritis 12/07/2020   Allergy    Anal stenosis    Anemia    Anxiety    Arthritis    Asthma    patient does not have inhaler   Blood in stool    Hx   Blood in urine    Hx   Blood transfusion without reported diagnosis    Cataract    CKD (chronic kidney disease) stage 3, GFR 30-59 ml/min (HCC)    Crohn's colitis, other complication (HCC)    De Quervain's tenosynovitis    Depression    Difficulty urinating    Hx   Easy bruising    Esophagitis    Fainting    History - resolved - due to dehydration   Fatigue    Hx   Fibroid    Hx   Gastric polyp    GERD (gastroesophageal reflux disease)    Hearing loss    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   Hemorrhoids, external    Hemorrhoids, internal    Herpes, genital    vaginal treated 07/05/12 and pt states is resolved   History of cervical dysplasia    History of small bowel obstruction    Hyperlipidemia    currently no meds   Hyperparathyroidism    Hypertension    Hypokalemia    Hx   Hypotension 06/29/2022   IBD (inflammatory bowel disease)    initially colectomy for suspected UC, 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   Obesity    Ovarian cyst    Pain in joint, pelvic region and thigh 12/16/2008   Centricity Description: HIP PAIN, LEFT  Qualifier: Diagnosis of   By:  Antonio ROSALEA Jamee      Centricity Description: HIP PAIN  Qualifier: Diagnosis of   By: Antonio ROSALEA Jamee       Pain of right thumb 04/12/2018   Poor dental hygiene    Pulmonary nodule, right    right upper lobe   Rectal bleeding    Hx   Rectal pain    Hx   Renal insufficiency    CKD - stage 3   RLS (restless legs syndrome)    no meds   Sepsis secondary to UTI (HCC) 06/29/2022   Severe sepsis (HCC) 12/07/2020   Tooth infection 11/2016   right low   Ulcerative colitis    Visual disturbance    wears glasses   Weakness generalized    Hx - patient denies generalized weakness   Wears dentures    upper only    Past Surgical History:  Procedure Laterality Date   ANAL DILATION     AV FISTULA PLACEMENT Right 06/12/2024   Procedure: ARTERIOVENOUS (AV) FISTULA CREATION RIGHT ARM;  Surgeon: Sheree,  Penne Bruckner, MD;  Location: [facility] OR;  Service: Vascular;  Laterality: Right;   BIOPSY  07/20/2022   Procedure: BIOPSY;  Surgeon: Aneita Gwendlyn DASEN, MD;  Location: [facility];  Service: Gastroenterology;;   CERVICAL BIOPSY  W/ LOOP ELECTRODE EXCISION     CHOLECYSTECTOMY     COLONOSCOPY     [name]   fatty tumor removed from back     X 2   HEMORRHOID SURGERY     ILEOSCOPY N/A 07/20/2022   Procedure: ILEOSCOPY THROUGH STOMA;  Surgeon: Aneita Gwendlyn DASEN, MD;  Location: [facility];  Service: Gastroenterology;  Laterality: N/A;   ILEOSTOMY CLOSURE     INSERTION OF DIALYSIS CATHETER Left 06/12/2024   Procedure: INSERTION OF DIALYSIS CATHETER;  Surgeon: Sheree Penne Bruckner, MD;  Location: [facility] OR;  Service: Vascular;  Laterality: Left;  TUNNLELED   IR CV LINE INJECTION  05/30/2023   IR CV LINE INJECTION  08/02/2023   IR CV LINE INJECTION  12/05/2023   IR IMAGING GUIDED PORT INSERTION  04/12/2023   IR IMAGING GUIDED PORT INSERTION  12/22/2023   IR REMOVAL TUN ACCESS W/ PORT W/O FL MOD SED  12/22/2023   RESTORATIVE PROCTOCOLECTOMY     with insertion of ileoanal J Pouch with loop ileostomy    SHOULDER ARTHROSCOPY WITH CAPSULORRHAPHY Left 06/14/2019   Procedure: LEFT SHOULDER ARTHRSCOPIC REPAIR OF BONY BANKART FRACTURE;  Surgeon: Dozier Soulier, MD;  Location: [facility];  Service: Orthopedics;  Laterality: Left;  REQUEST 90 MINUTE   SIGMOIDOSCOPY     TOTAL ABDOMINAL HYSTERECTOMY  1998   TAH/LSO   TUBAL LIGATION     UPPER GASTROINTESTINAL ENDOSCOPY     [name]    Social History   Socioeconomic History   Marital status: Divorced    Spouse name: Not on file   Number of children: Not on file   Years of education: Not on file   Highest education level: Not on file  Occupational History   Not on file  Tobacco Use   Smoking status: Former    Current packs/day: 0.00    Average packs/day: 1 pack/day for 4.0 years (4.0 ttl pk-yrs)    Types: Cigarettes    Start date: 05/21/1975    Quit date: 05/21/1979    Years since quitting: 45.2   Smokeless tobacco: Never  Vaping Use   Vaping status: Never Used  Substance and Sexual Activity   Alcohol use: No    Alcohol/week: 0.0 standard drinks of alcohol   Drug use: No   Sexual activity: Yes    Birth control/protection: Post-menopausal    Comment: 1st intercourse 68 yo-Fewer than 5 partners  Other Topics Concern   Not on file  Social History Narrative   Not on file   Social Drivers of Health   Tobacco Use: Medium Risk (08/16/2024)   Patient History    Smoking Tobacco Use: Former    Smokeless Tobacco Use: Never    Passive Exposure: Not on Actuary Strain: Low Risk (05/22/2024)   Overall Financial Resource Strain (CARDIA)    Difficulty of Paying Living Expenses: Not very hard  Food Insecurity: No Food Insecurity (06/08/2024)   Epic    Worried About Programme Researcher, Broadcasting/film/video in the Last Year: Never true    Ran Out of Food in the Last Year: Never true  Transportation Needs: No Transportation Needs (06/08/2024)   Epic    Lack of Transportation (Medical): No    Lack of Transportation (Non-Medical): No  Physical  Activity:  Insufficiently Active (05/22/2024)   Exercise Vital Sign    Days of Exercise per Week: 7 days    Minutes of Exercise per Session: 10 min  Stress: No Stress Concern Present (05/22/2024)   [employer] of Occupational Health - Occupational Stress Questionnaire    Feeling of Stress: Not at all  Social Connections: Socially Isolated (06/08/2024)   Social Connection and Isolation Panel    Frequency of Communication with Friends and Family: More than three times a week    Frequency of Social Gatherings with Friends and Family: More than three times a week    Attends Religious Services: Never    Database Administrator or Organizations: No    Attends Banker Meetings: Never    Marital Status: Widowed  Intimate Partner Violence: Not At Risk (06/08/2024)   Epic    Fear of Current or Ex-Partner: No    Emotionally Abused: No    Physically Abused: No    Sexually Abused: No  Depression (PHQ2-9): Low Risk (07/17/2024)   Depression (PHQ2-9)    PHQ-2 Score: 0  Recent Concern: Depression (PHQ2-9) - High Risk (05/03/2024)   Depression (PHQ2-9)    PHQ-2 Score: 17  Alcohol Screen: Low Risk (05/22/2024)   Alcohol Screen    Last Alcohol Screening Score (AUDIT): 0  Housing: Low Risk (06/08/2024)   Epic    Unable to Pay for Housing in the Last Year: No    Number of Times Moved in the Last Year: 1    Homeless in the Last Year: No  Utilities: Not At Risk (06/08/2024)   Epic    Threatened with loss of utilities: No  Health Literacy: Adequate Health Literacy (05/22/2024)   B1300 Health Literacy    Frequency of need for help with medical instructions: Never    Family History  Problem Relation Age of Onset   Hypertension Mother    Heart disease Mother        s/p pci   Ulcerative colitis Father    Hypertension Father    Heart attack Father    Diabetes Sister    Cancer Sister        uterine   Pulmonary fibrosis Sister    Cancer Maternal Uncle        LUNG   Ulcerative colitis Daughter     Liver disease Daughter    Irritable bowel syndrome Other        grandchildren   Colon cancer Neg Hx    Esophageal cancer Neg Hx    Stomach cancer Neg Hx    Rectal cancer Neg Hx     Current Outpatient Medications  Medication Sig Dispense Refill   acetaminophen  (TYLENOL ) 500 MG tablet Take 1,000 mg by mouth every 6 (six) hours as needed (for pain/sleep).     allopurinol  (ZYLOPRIM ) 300 MG tablet TAKE 1/2 TABLET (150 mg total) BY MOUTH DAILY AT 9PM 90 tablet 11   amLODipine  (NORVASC ) 5 MG tablet Take 1 tablet (5 mg total) by mouth daily. (Patient not taking: Reported on 07/17/2024) 30 tablet 0   calcitRIOL  (ROCALTROL ) 0.25 MCG capsule Take 0.25 mcg by mouth daily. (Patient not taking: Reported on 07/17/2024)     cephALEXin  (KEFLEX ) 500 MG capsule Take 1 capsule (500 mg total) by mouth 2 (two) times daily. 14 capsule 0   DULoxetine  (CYMBALTA ) 60 MG capsule TAKE ONE CAPSULE (60 MG TOTAL) BY MOUTH DAILY AT 9AM (PATIENT NEEDS FURTHER EVALUATION AND/OR LABORATORY TESTING BEFORE FURTHER REFILLS ARE  GIVEN) 60 capsule 11   hydrALAZINE  (APRESOLINE ) 50 MG tablet Take 1 tablet (50 mg total) by mouth every 8 (eight) hours. (Patient not taking: Reported on 07/17/2024) 90 tablet 0   metoprolol  succinate (TOPROL -XL) 100 MG 24 hr tablet Take 100 mg by mouth at bedtime.     octreotide  (SANDOSTATIN  LAR DEPOT) 20 MG injection Inject 20 mg into the muscle every 28 (twenty-eight) days. 1 each 6   Vitamin D , Ergocalciferol , (DRISDOL ) 1.25 MG (50000 UNIT) CAPS capsule Take 50,000 Units by mouth every 30 (thirty) days.     No current facility-administered medications for this visit.   Facility-Administered Medications Ordered in Other Visits  Medication Dose Route Frequency Provider Last Rate Last Admin   heparin  lock flush 100 unit/mL  500 Units Intracatheter Once PRN Rayburn Pac, MD       heparin  lock flush 100 unit/mL  250 Units Intracatheter Once PRN Rayburn Pac, MD       octreotide   (SANDOSTATIN  LAR) IM injection 20 mg  20 mg Intramuscular Once Armbruster, Elspeth SQUIBB, MD       sodium chloride  0.9 % bolus 1,000 mL  1,000 mL Intravenous Once Rayburn Pac, MD        Allergies[1]   REVIEW OF SYSTEMS:  Negative unless noted in HPI [X]  denotes positive finding, [ ]  denotes negative finding Cardiac  Comments:  Chest pain or chest pressure:    Shortness of breath upon exertion:    Short of breath when lying flat:    Irregular heart rhythm:        Vascular    Pain in calf, thigh, or hip brought on by ambulation:    Pain in feet at night that wakes you up from your sleep:     Blood clot in your veins:    Leg swelling:         Pulmonary    Oxygen at home:    Productive cough:     Wheezing:         Neurologic    Sudden weakness in arms or legs:     Sudden numbness in arms or legs:     Sudden onset of difficulty speaking or slurred speech:    Temporary loss of vision in one eye:     Problems with dizziness:         Gastrointestinal    Blood in stool:     Vomited blood:         Genitourinary    Burning when urinating:     Blood in urine:        Psychiatric    Major depression:         Hematologic    Bleeding problems:    Problems with blood clotting too easily:        Skin    Rashes or ulcers:        Constitutional    Fever or chills:      PHYSICAL EXAMINATION:  Vitals:   08/16/24 1254  BP: 127/79  Pulse: (!) 120  Temp: 97.7 F (36.5 C)  TempSrc: Temporal  Weight: 179 lb 3.2 oz (81.3 kg)    General:  WDWN in NAD; vital signs documented above Gait: Not observed HENT: WNL, normocephalic Pulmonary: normal non-labored breathing Cardiac: regular HR Abdomen: soft, NT, no masses Skin: without rashes Extremities: Symmetrical grip strength; unable to palpate thrill or flow through the right arm fistula Musculoskeletal: no muscle wasting or atrophy  Neurologic: A&O X 3 Psychiatric:  The pt has Normal affect.   Non-Invasive Vascular  Imaging:   Right arm AV fistula occluded on duplex    ASSESSMENT/PLAN:: 68 y.o. female status post brachial basilic fistula creation in the right arm  Ms. Scallan is a 68 year old female who underwent right brachiobasilic fistula creation about 2 months ago.  On exam I am unable to palpate any flow through the fistula.  Duplex confirms that the fistula has occluded.  Preoperative vein mapping did not demonstrate any cephalic vein in the right arm.  She will continue HD via [facility] for now.  We will schedule her for right arm AV graft placement.  Case was discussed in detail with the patient and she is agreeable to proceed.  This will be scheduled on a nondialysis day in January.   Donnice Sender, PA-C [facility] [phone]  Clinic MD:   [place]     [1]  Allergies Allergen Reactions   Sulfa Antibiotics Hives, Itching, Rash and Swelling   Morphine Other (See Comments)    Gives me crazy dreams (delusions, also) Psychosis, also    Sulfonamide Derivatives Hives, Itching and Swelling

## 2024-08-22 ENCOUNTER — Ambulatory Visit: Admitting: Gastroenterology

## 2024-08-22 NOTE — Progress Notes (Deleted)
 HPI :  IBD history: 68 year old white female with IBD ( Crohn's) since 45.  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 She was never able to be put in remission prior to colectomy. She was only exposed to steroids prior to her surgery. Post-operatively, she was on Remicade  for 3-4 years but did not help too much. She had been on remotely but did not work for her. She was on Humira  but also did not put her into remission and she developed high antibody titers and it was stopped. Most recently on Entyvio  and low dose 6MP. 6MP was stopped due to worsening anemia.  She reports multiple courses of antibiotics with some benefit historically.  She has been on budesonider with ? Relief. Father and daughter have UC.  She has seen Slater Falconer and Dr. Frederik colorectal surgery at [facility] previously for second opinion, she has thought to have Crohn's of the pouch + chronic pouchitis + pouch dysfunction. Most recently saw Dr. Cy Brochure at [facility] and had diverting ileostomy April 23 2021. Returned for J pouch excision Jan 2023.  She has dealt with high ostomy output since that surgery.   SINCE THE LAST VISIT   68 year old female here for follow-up visit.  Recall she has had problems with increased ostomy output over time associated with dehydration and worsening renal function.   I have seen her a few times for this and have tried to adjust her bowel regimen.  She has undergone workup in the past to exclude active Crohn's disease with fecal calprotectin, CT scan, ileoscopy.  Those tests have not revealed any concerning pathology.  We have tried her on a variety of regimens to decrease her output including Imodium , cholestyramine , psyllium husk, most recently octreotide .  I had recommended stopping cholestyramine , placing her on psyllium husk, continuing octreotide , and using Imodium  routinely.  She states she takes Imodium  2 tabs twice daily at times but is not reliable  with it.  She thinks she fills up at least 2 bags of her ostomy output per day if not more.  She has been having IV fluid infusions on Monday Wednesday and Friday to prevent dehydration and keep her out of the hospital.  I have seen notes from her nephrologist that her renal function is worsening and they are considering starting dialysis.  She also cares for her mother and states she has a hard time remembering to take all of her antidiarrheal medicines.   Since starting octreotide  Depo she does state that it helps reduce her stool frequency, especially in the weeks after she received the injection.  She gets it once monthly.  She was not sure if the psyllium husk is helped her too much lately.  She was tried back on cholestyramine  by her nephrologist but cannot really tell much of a difference.  She has some gas pains but no significant abdominal pains that bother her.  She is scheduled to get a dialysis catheter placed in March.   She also describes significant urinary incontinence.  She states she cannot hold it in and feels her pelvic floor is weaker after having gone through her J-pouch surgery.  We discussed otherwise what she is drinking, she is drinking soft drinks and water  to help maintain her hydration.     Prior workup: Pouchoscopy 03/31/17 - anal fissure, mild anal canal stenosis, focal ulceration of pouch and more proximal ileum but improved compared to previous   Pouchoscopy  01/21/20 -  Preparation of the colon was poor. - A suspected small perianal fistulous tract and anal canal stenosis found on digital rectal exam. - The ileal pouch has an angulated turn with very focal ulcerations, otherwise no inflammatory changes noted. biopsies obtained - Small area of nodularity along anal canal - biopsied to rule out AIN / condyloma - Suspected fistal of the distal pouch just proximal to anal canal Overall, only one focal ulceration noted and no other inflammatory changes seen.  Incidentally noted suspected new fistula   EGD 01/21/20 -  - A 3 cm hiatal hernia was present. - LA Grade A esophagitis was found 33 cm from the incisors - mild. - The exam of the esophagus was otherwise normal. No Barrett's esophagus. - Multiple small sessile polyps were found in the gastric fundus and in the gastric body, benign appearing, likely fundic gland polyps, a cluster was noted at the distal end of the hernia sac. Biopsies were taken with a cold forceps for histology. - The exam of the stomach was otherwise normal. - The duodenal bulb and second portion of the duodenum were normal.   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 Cellulitis extends superiorly throughout the posterior subcutaneous fat, adjacent to the gluteal and posterior paraspinal muscles. No evidence of abscess.     MRE 06/25/20 - IMPRESSION: 1. Persistent inflammatory changes near the upper gluteal cleft bilaterally between the gluteus maximus muscles. No discrete drainable soft tissue abscess or intramuscular involvement. 2. No MR findings for acute inflammatory small-bowel disease. However, study is limited without contrast.   CT scan abdomen/ pelvis without contrast 05/01/21: IMPRESSION: 1. Findings worrisome for an area of focal bowel perforation versus anastomosis leak within the anterior aspect of the mid abdomen, with extravasation of air and contrast into the adjacent surgical incision site. 2. Postoperative changes within subsequent right lower quadrant ileostomy. 3. Aortic atherosclerosis.   CT scan abdomen / pelvis without contrast 05/14/21: IMPRESSION: Status post proctocolectomy. Ileostomy is noted in right lower quadrant. There is no evidence of bowel obstruction seen at this time.   No definite acute abnormality is seen in the  abdomen or pelvis at this time.     CT abdomen / pelvis without contrast 09/22/21: IMPRESSION: 1. Patient is status post colectomy with right lower quadrant ileostomy. Small bowel loops in the upper abdomen and stomach appeared dilated concerning for small bowel obstruction. Transition point is not definitive, but may be at the level of the ostomy. No free air or pneumatosis. 2. There is wall thickening of the distal esophagus and proximal stomach worrisome for gastritis and esophagitis. 3. Small amount of air in the pelvis is unchanged from prior, likely postoperative. 4. Nonobstructing left renal calculus     CT abdomen / pelvis without contrast 06/29/22: IMPRESSION: Status post colectomy with ileostomy seen in right lower quadrant. No abnormal bowel dilatation or inflammation is noted. No acute abnormality seen in the abdomen or pelvis.     Ileoscopy 07/20/22: normal   Small bowel follow through 07/21/22: IMPRESSION: Right mid abdominal ileostomy. No findings to indicate disease progression.     ESR and CRP normal on 07/21/22   Fecal calprotectin 07/19/22 - 5     CT abdomen / pelvis 05/28/23: IMPRESSION: 1. No acute abnormality in the abdomen or pelvis. 2. Postsurgical changes from colectomy and right upper  quadrant ileostomy. No evidence of bowel obstruction. 3. Small hiatal hernia. 4.  Aortic Atherosclerosis (ICD10-I70.0).   68 y.o. female here for assessment of the following   1. High output ileostomy (HCC)   2. Crohn's disease of both small and large intestine with other complication (HCC)     Complicated Crohn's disease status post colectomy with J-pouch excision now with end ileostomy.  She has had high output ostomy in recent years unfortunately leading to recurrent dehydration and progressive kidney disease.  She is now on maintenance IV fluids as outpatient to prevent positional dehydration.  Unfortunately her kidney disease has progressed where she may be  needing dialysis soon.   Based on her prior workup we have not found any active Crohn's disease which is causing this. We have tried several regimens to help reduce her output.  Most recently octreotide  Depo injections have been added which she subjectively thinks has helped slowed it, particularly helpful after the first few weeks after the injection.  She feels the output picks up closer to the time of her next injection.   I had intended for her to take Imodium  on a scheduled basis and she is taking it but she cares for her mother and has a hard time being compliant with it.  I do think she has room to go with her antidiarrheals.  I do not think cholestyramine  is a great idea for this, it is not helping her and will stop it.   Discussed other options.  In regards to her oral hydration, she is drinking a lot of soda and water .  I recommend she drink Pedialyte more frequently and try to avoid sugary drinks.  We will continue the octreotide .  I will switch the Imodium  out to Lomotil  and will start at 2 tabs 4 times daily.  I will also add Bentyl  10 mg every 8 hours to use as needed for cramps and worsening diarrhea.  We will contact her in 2 weeks for reassessment and see how she is doing.  If she is no better we may start codeine as well.  Need to maximize her medical therapy for this and I stressed the importance of compliance with the Lomotil , I do think this will help her.   Otherwise we discussed her urinary incontinence which is significant and worsened since her J-pouch surgery.  Will refer her to pelvic floor PT to check her pelvic floor and see if they can help this problem     PLAN: - stop cholestyramine  - stop immodium - continue IVF M,W,F - drink pedialyte as maintenance oral fluid, stop soda - continue octreotide  - lab today for fecal calprotectin to make sure no active inflammation - start lomotil  2 tabs four times daily - start bentyl  10mg  every 8 hours PRN  - call her in 2 weeks  for reassessment and see how she is doing - if no better in 2 weeks will start codeine - referral to pelvic floor PT for urinary incontinence         Past Medical History:  Diagnosis Date   Abdominal pain    Hx   Acute pyelonephritis 12/07/2020   Allergy    Anal stenosis    Anemia    Anxiety    Arthritis    Asthma    patient does not have inhaler   Blood in stool    Hx   Blood in urine    Hx   Blood transfusion without reported diagnosis  Cataract    CKD (chronic kidney disease) stage 3, GFR 30-59 ml/min (HCC)    Crohn's colitis, other complication (HCC)    De Quervain's tenosynovitis    Depression    Difficulty urinating    Hx   Easy bruising    Esophagitis    Fainting    History - resolved - due to dehydration   Fatigue    Hx   Fibroid    Hx   Gastric polyp    GERD (gastroesophageal reflux disease)    Hearing loss    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   Hemorrhoids, external    Hemorrhoids, internal    Herpes, genital    vaginal treated 07/05/12 and pt states is resolved   History of cervical dysplasia    History of small bowel obstruction    Hyperlipidemia    currently no meds   Hyperparathyroidism    Hypertension    Hypokalemia    Hx   Hypotension 06/29/2022   IBD (inflammatory bowel disease)    initially colectomy for suspected UC, 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   Obesity    Ovarian cyst    Pain in joint, pelvic region and thigh 12/16/2008   Centricity Description: HIP PAIN, LEFT  Qualifier: Diagnosis of   By: Antonio ROSALEA Rockers      Centricity Description: HIP PAIN  Qualifier: Diagnosis of   By: Antonio ROSALEA Rockers       Pain of right thumb 04/12/2018   Poor dental hygiene    Pulmonary nodule, right    right upper lobe   Rectal bleeding    Hx   Rectal pain    Hx   Renal insufficiency    CKD - stage 3   RLS (restless legs syndrome)    no meds   Sepsis secondary to UTI (HCC)  06/29/2022   Severe sepsis (HCC) 12/07/2020   Tooth infection 11/2016   right low   Ulcerative colitis    Visual disturbance    wears glasses   Weakness generalized    Hx - patient denies generalized weakness   Wears dentures    upper only     Past Surgical History:  Procedure Laterality Date   ANAL DILATION     AV FISTULA PLACEMENT Right 06/12/2024   Procedure: ARTERIOVENOUS (AV) FISTULA CREATION RIGHT ARM;  Surgeon: Sheree Penne Bruckner, MD;  Location: [facility] OR;  Service: Vascular;  Laterality: Right;   BIOPSY  07/20/2022   Procedure: BIOPSY;  Surgeon: Aneita Gwendlyn DASEN, MD;  Location: [facility];  Service: Gastroenterology;;   CERVICAL BIOPSY  W/ LOOP ELECTRODE EXCISION     CHOLECYSTECTOMY     COLONOSCOPY     [name]   fatty tumor removed from back     X 2   HEMORRHOID SURGERY     ILEOSCOPY N/A 07/20/2022   Procedure: ILEOSCOPY THROUGH STOMA;  Surgeon: Aneita Gwendlyn DASEN, MD;  Location: [facility];  Service: Gastroenterology;  Laterality: N/A;   ILEOSTOMY CLOSURE     INSERTION OF DIALYSIS CATHETER Left 06/12/2024   Procedure: INSERTION OF DIALYSIS CATHETER;  Surgeon: Sheree Penne Bruckner, MD;  Location: [facility] OR;  Service: Vascular;  Laterality: Left;  TUNNLELED   IR CV LINE INJECTION  05/30/2023   IR CV LINE INJECTION  08/02/2023   IR CV LINE INJECTION  12/05/2023   IR IMAGING GUIDED PORT INSERTION  04/12/2023   IR IMAGING  GUIDED PORT INSERTION  12/22/2023   IR REMOVAL TUN ACCESS W/ PORT W/O FL MOD SED  12/22/2023   RESTORATIVE PROCTOCOLECTOMY     with insertion of ileoanal J Pouch with loop ileostomy   SHOULDER ARTHROSCOPY WITH CAPSULORRHAPHY Left 06/14/2019   Procedure: LEFT SHOULDER ARTHRSCOPIC REPAIR OF BONY BANKART FRACTURE;  Surgeon: Dozier Soulier, MD;  Location: [facility];  Service: Orthopedics;  Laterality: Left;  REQUEST 90 MINUTE   SIGMOIDOSCOPY     TOTAL ABDOMINAL HYSTERECTOMY  1998   TAH/LSO   TUBAL LIGATION     UPPER GASTROINTESTINAL ENDOSCOPY     [name]    Family History  Problem Relation Age of Onset   Hypertension Mother    Heart disease Mother        s/p pci   Ulcerative colitis Father    Hypertension Father    Heart attack Father    Diabetes Sister    Cancer Sister        uterine   Pulmonary fibrosis Sister    Cancer Maternal Uncle        LUNG   Ulcerative colitis Daughter    Liver disease Daughter    Irritable bowel syndrome Other        grandchildren   Colon cancer Neg Hx    Esophageal cancer Neg Hx    Stomach cancer Neg Hx    Rectal cancer Neg Hx    Social History[1] Current Outpatient Medications  Medication Sig Dispense Refill   acetaminophen  (TYLENOL ) 500 MG tablet Take 1,000 mg by mouth every 6 (six) hours as needed (for pain/sleep).     allopurinol  (ZYLOPRIM ) 300 MG tablet TAKE 1/2 TABLET (150 mg total) BY MOUTH DAILY AT 9PM 90 tablet 11   amLODipine  (NORVASC ) 5 MG tablet Take 1 tablet (5 mg total) by mouth daily. (Patient not taking: Reported on 07/17/2024) 30 tablet 0   calcitRIOL  (ROCALTROL ) 0.25 MCG capsule Take 0.25 mcg by mouth daily. (Patient not taking: Reported on 07/17/2024)     cephALEXin  (KEFLEX ) 500 MG capsule Take 1 capsule (500 mg total) by mouth 2 (two) times daily. 14 capsule 0   DULoxetine  (CYMBALTA ) 60 MG capsule TAKE ONE CAPSULE (60 MG TOTAL) BY MOUTH DAILY AT 9AM (PATIENT NEEDS FURTHER EVALUATION AND/OR LABORATORY TESTING BEFORE FURTHER REFILLS ARE GIVEN) 60 capsule 11   hydrALAZINE  (APRESOLINE ) 50 MG tablet Take 1 tablet (50 mg total) by mouth every 8 (eight) hours. (Patient not taking: Reported on 07/17/2024) 90 tablet 0   metoprolol  succinate (TOPROL -XL) 100 MG 24 hr tablet Take 100 mg by mouth at bedtime.     octreotide  (SANDOSTATIN  LAR DEPOT) 20 MG injection Inject 20 mg into the muscle every 28 (twenty-eight) days. 1 each 6   Vitamin D , Ergocalciferol , (DRISDOL ) 1.25 MG (50000 UNIT) CAPS capsule Take 50,000 Units by mouth every 30 (thirty) days.     No current facility-administered  medications for this visit.   Facility-Administered Medications Ordered in Other Visits  Medication Dose Route Frequency Provider Last Rate Last Admin   heparin  lock flush 100 unit/mL  500 Units Intracatheter Once PRN Rayburn Pac, MD       heparin  lock flush 100 unit/mL  250 Units Intracatheter Once PRN Rayburn Pac, MD       octreotide  (SANDOSTATIN  LAR) IM injection 20 mg  20 mg Intramuscular Once Trina Asch, Elspeth SQUIBB, MD       sodium chloride  0.9 % bolus 1,000 mL  1,000 mL Intravenous Once Rayburn Pac, MD  Allergies[2]   Review of Systems: All systems reviewed and negative except where noted in HPI.    VAS US  DUPLEX DIALYSIS ACCESS (AVF, AVG) Result Date: 08/16/2024 DIALYSIS ACCESS Patient Name:  [name]  Date of Exam:   08/16/2024 Medical Rec #: [id number]        Accession #:    [id number] Date of Birth: Nov 22, 1955        Patient Gender: F Patient Age:   40 years Exam Location:  [address] Procedure:      VAS US  DUPLEX DIALYSIS ACCESS (AVF, AVG) Referring Phys: PENNE COLORADO --------------------------------------------------------------------------------  Reason for Exam: Routine follow up. Access Site: Right Upper Extremity. Access Type: Basilic vein transposition. History: 06/12/2024: Placement of left IJ 23 cm tunneled dialysis catheter with          ultrasound fluoroscopic guidance          2. Creation of right first stage basilic vein AV fistula. Limitations: Patient was concerned that she damaged the fistula by having              several falls within the two weeks following the surgery. Performing Technologist: Stoney Ross RVT  Examination Guidelines: A complete evaluation includes B-mode imaging, spectral Doppler, color Doppler, and power Doppler as needed of all accessible portions of each vessel. Unilateral testing is considered an integral part of a complete examination. Limited examinations for reoccurring indications may be performed as noted.   Findings: +--------------------+----------+-----------------+---------+ AVF                 PSV (cm/s)Flow Vol (mL/min)Comments  +--------------------+----------+-----------------+---------+ Native artery inflow   151           215       Triphasic +--------------------+----------+-----------------+---------+ AVF Anastomosis        121                               +--------------------+----------+-----------------+---------+  +------------+----------+-------------+----------+--------+ OUTFLOW VEINPSV (cm/s)Diameter (cm)Depth (cm)Describe +------------+----------+-------------+----------+--------+ Prox UA                                      occluded +------------+----------+-------------+----------+--------+ Mid UA         185        0.40        1.24            +------------+----------+-------------+----------+--------+ Dist UA        215        0.49        0.84            +------------+----------+-------------+----------+--------+  Summary: Arteriovenous fistula is occluded in the distal upper arm. Flow volume = 215 mL/min.  *See table(s) above for measurements and observations.  Diagnosing physician: Fonda Rim Electronically signed by Fonda Rim on 08/16/2024 at 1:09:28 PM.  --------------------------------------------------------------------------------   Final     Physical Exam: There were no vitals taken for this visit. Constitutional: Pleasant,well-developed, ***female in no acute distress. HEENT: Normocephalic and atraumatic. Conjunctivae are normal. No scleral icterus. Neck supple.  Cardiovascular: Normal rate, regular rhythm.  Pulmonary/chest: Effort normal and breath sounds normal. No wheezing, rales or rhonchi. Abdominal: Soft, nondistended, nontender. Bowel sounds active throughout. There are no masses palpable. No hepatomegaly. Extremities: no edema Lymphadenopathy: No cervical adenopathy noted. Neurological: Alert and oriented to person place  and time. Skin: Skin is  warm and dry. No rashes noted. Psychiatric: Normal mood and affect. Behavior is normal.   ASSESSMENT: 68 y.o. female here for assessment of the following  No diagnosis found.  PLAN:   Antonio Cyndee Jamee JONELLE, DO    [1]  Social History Tobacco Use   Smoking status: Former    Current packs/day: 0.00    Average packs/day: 1 pack/day for 4.0 years (4.0 ttl pk-yrs)    Types: Cigarettes    Start date: 05/21/1975    Quit date: 05/21/1979    Years since quitting: 45.2   Smokeless tobacco: Never  Vaping Use   Vaping status: Never Used  Substance Use Topics   Alcohol use: No    Alcohol/week: 0.0 standard drinks of alcohol   Drug use: No  [2]  Allergies Allergen Reactions   Sulfa Antibiotics Hives, Itching, Rash and Swelling   Gold     Itching, Hives   Morphine Other (See Comments)    Gives me crazy dreams (delusions, also) Psychosis, also    Silver     Itching, Hives   Sulfonamide Derivatives Hives, Itching and Swelling   Zinc Trace Metal Additives [Zinc]     Itching, Hives

## 2024-08-28 ENCOUNTER — Other Ambulatory Visit (HOSPITAL_COMMUNITY): Payer: Self-pay | Admitting: Pharmacist

## 2024-09-04 ENCOUNTER — Ambulatory Visit: Admitting: Family Medicine

## 2024-09-06 ENCOUNTER — Telehealth: Payer: Self-pay

## 2024-09-06 NOTE — Telephone Encounter (Signed)
 Sandostatin  LAR has been approved for re-enrollment thru 09/05/2025.  Approval extended to 09/06/2024 - 09/05/2025.  Reason for Assist: OOP.   ID: [id number].

## 2024-09-07 ENCOUNTER — Encounter: Payer: Self-pay | Admitting: Gastroenterology

## 2024-09-07 ENCOUNTER — Encounter: Payer: Self-pay | Admitting: Family

## 2024-09-17 ENCOUNTER — Encounter (HOSPITAL_COMMUNITY): Payer: Self-pay | Admitting: Vascular Surgery

## 2024-09-17 ENCOUNTER — Other Ambulatory Visit: Payer: Self-pay

## 2024-09-17 ENCOUNTER — Telehealth: Payer: Self-pay

## 2024-09-17 NOTE — Progress Notes (Signed)
 Multiple attempts to reach patient with no success.  LVM with arrival time of 1045.

## 2024-09-17 NOTE — Telephone Encounter (Signed)
 LVM to inform of surgery arrival time change.  Arrival is now 8:00a on 1/13.

## 2024-09-17 NOTE — Progress Notes (Signed)
 SDW CALL  Patient was given pre-op  instructions over the phone. The opportunity was given for the patient to ask questions. No further questions asked. Patient verbalized understanding of instructions given.   PCP - Jamee Cook Cardiologist - denies  PPM/ICD - denies Device Orders - n/a Rep Notified -  n/a  Chest x-ray - 07/12/24 EKG - 06/09/24 Stress Test - 2008 ECHO - 06/08/24 Cardiac Cath -   Sleep Study - denies CPAP - n/a  No DM  Last dose of GLP1 agonist-  n/a GLP1 instructions:  n/a  Blood Thinner Instructions: n/a Aspirin Instructions: n/a  ERAS Protcol - NPO PRE-SURGERY Ensure or G2- n/a  COVID TEST- n/a   Anesthesia review: no  Patient denies shortness of breath, fever, cough and chest pain over the phone call   All instructions explained to the patient, with a verbal understanding of the material. Patient agrees to go over the instructions while at home for a better understanding.

## 2024-09-18 ENCOUNTER — Encounter (HOSPITAL_COMMUNITY)
Admission: RE | Disposition: A | Discharge status: Home / Self Care | Payer: Self-pay | Source: Home / Self Care | Attending: Vascular Surgery

## 2024-09-18 ENCOUNTER — Encounter (HOSPITAL_COMMUNITY): Payer: Self-pay | Admitting: Vascular Surgery

## 2024-09-18 ENCOUNTER — Other Ambulatory Visit (HOSPITAL_COMMUNITY): Payer: Self-pay

## 2024-09-18 ENCOUNTER — Ambulatory Visit (HOSPITAL_COMMUNITY)

## 2024-09-18 ENCOUNTER — Ambulatory Visit (HOSPITAL_COMMUNITY)
Admission: RE | Admit: 2024-09-18 | Discharge: 2024-09-18 | Disposition: A | Attending: Vascular Surgery | Admitting: Vascular Surgery

## 2024-09-18 DIAGNOSIS — Z992 Dependence on renal dialysis: Secondary | ICD-10-CM

## 2024-09-18 DIAGNOSIS — N186 End stage renal disease: Secondary | ICD-10-CM

## 2024-09-18 DIAGNOSIS — J45909 Unspecified asthma, uncomplicated: Secondary | ICD-10-CM | POA: Diagnosis not present

## 2024-09-18 DIAGNOSIS — Z87891 Personal history of nicotine dependence: Secondary | ICD-10-CM | POA: Diagnosis not present

## 2024-09-18 DIAGNOSIS — I120 Hypertensive chronic kidney disease with stage 5 chronic kidney disease or end stage renal disease: Secondary | ICD-10-CM | POA: Diagnosis not present

## 2024-09-18 DIAGNOSIS — M1990 Unspecified osteoarthritis, unspecified site: Secondary | ICD-10-CM | POA: Diagnosis not present

## 2024-09-18 DIAGNOSIS — K219 Gastro-esophageal reflux disease without esophagitis: Secondary | ICD-10-CM | POA: Diagnosis not present

## 2024-09-18 HISTORY — PX: INSERTION OF ARTERIOVENOUS (AV) ARTEGRAFT ARM: SHX6779

## 2024-09-18 LAB — POCT I-STAT, CHEM 8
BUN: 54 mg/dL — ABNORMAL HIGH (ref 8–23)
Calcium, Ion: 1.15 mmol/L (ref 1.15–1.40)
Chloride: 107 mmol/L (ref 98–111)
Creatinine, Ser: 3.4 mg/dL — ABNORMAL HIGH (ref 0.44–1.00)
Glucose, Bld: 104 mg/dL — ABNORMAL HIGH (ref 70–99)
HCT: 35 % — ABNORMAL LOW (ref 36.0–46.0)
Hemoglobin: 11.9 g/dL — ABNORMAL LOW (ref 12.0–15.0)
Potassium: 4.1 mmol/L (ref 3.5–5.1)
Sodium: 138 mmol/L (ref 135–145)
TCO2: 19 mmol/L — ABNORMAL LOW (ref 22–32)

## 2024-09-18 MED ORDER — OXYCODONE HCL 5 MG PO TABS
5.0000 mg | ORAL_TABLET | ORAL | 0 refills | Status: AC | PRN
  Filled 2024-09-18: qty 8, 2d supply, fill #0

## 2024-09-18 MED ORDER — PHENYLEPHRINE HCL-NACL 20-0.9 MG/250ML-% IV SOLN
INTRAVENOUS | Status: DC | PRN
  Administered 2024-09-18: 30 ug/min via INTRAVENOUS

## 2024-09-18 MED ORDER — HEPARIN 6000 UNIT IRRIGATION SOLUTION
Status: AC
  Filled 2024-09-18: qty 500

## 2024-09-18 MED ORDER — SODIUM CHLORIDE 0.9 % IV SOLN: INTRAVENOUS | Status: DC

## 2024-09-18 MED ORDER — FENTANYL CITRATE (PF) 100 MCG/2ML IJ SOLN
INTRAMUSCULAR | Status: AC
  Filled 2024-09-18: qty 2

## 2024-09-18 MED ORDER — ACETAMINOPHEN 500 MG PO TABS
ORAL_TABLET | ORAL | Status: AC
  Filled 2024-09-18: qty 2

## 2024-09-18 MED ORDER — LIDOCAINE-EPINEPHRINE (PF) 1 %-1:200000 IJ SOLN
INTRAMUSCULAR | Status: AC
  Filled 2024-09-18: qty 30

## 2024-09-18 MED ORDER — DROPERIDOL 2.5 MG/ML IJ SOLN: 0.6250 mg | Freq: Once | INTRAMUSCULAR | Status: DC | PRN

## 2024-09-18 MED ORDER — HEPARIN 6000 UNIT IRRIGATION SOLUTION
Status: DC | PRN
  Administered 2024-09-18: 1

## 2024-09-18 MED ORDER — HEPARIN SODIUM (PORCINE) 1000 UNIT/ML IJ SOLN
INTRAMUSCULAR | Status: DC | PRN
  Administered 2024-09-18: 3000 [IU] via INTRAVENOUS

## 2024-09-18 MED ORDER — PROPOFOL 10 MG/ML IV BOLUS
INTRAVENOUS | Status: DC | PRN
  Administered 2024-09-18: 150 mg via INTRAVENOUS
  Administered 2024-09-18: 40 mg via INTRAVENOUS

## 2024-09-18 MED ORDER — ACETAMINOPHEN 10 MG/ML IV SOLN: 1000.0000 mg | Freq: Once | INTRAVENOUS | Status: DC | PRN

## 2024-09-18 MED ORDER — HEMOSTATIC AGENTS (NO CHARGE) OPTIME
TOPICAL | Status: DC | PRN
  Administered 2024-09-18: 1 via TOPICAL

## 2024-09-18 MED ORDER — LIDOCAINE 2% (20 MG/ML) 5 ML SYRINGE
INTRAMUSCULAR | Status: DC | PRN
  Administered 2024-09-18: 100 mg via INTRAVENOUS

## 2024-09-18 MED ORDER — OXYCODONE HCL 5 MG PO TABS: 5.0000 mg | ORAL_TABLET | Freq: Once | ORAL | Status: DC | PRN

## 2024-09-18 MED ORDER — 0.9 % SODIUM CHLORIDE (POUR BTL) OPTIME
TOPICAL | Status: DC | PRN
  Administered 2024-09-18: 1000 mL

## 2024-09-18 MED ORDER — CHLORHEXIDINE GLUCONATE 0.12 % MT SOLN
OROMUCOSAL | Status: AC
  Administered 2024-09-18: 15 mL via OROMUCOSAL
  Filled 2024-09-18: qty 15

## 2024-09-18 MED ORDER — PROPOFOL 10 MG/ML IV BOLUS
INTRAVENOUS | Status: AC
  Filled 2024-09-18: qty 20

## 2024-09-18 MED ORDER — CHLORHEXIDINE GLUCONATE 0.12 % MT SOLN: 15.0000 mL | Freq: Once | OROMUCOSAL | Status: AC

## 2024-09-18 MED ORDER — CHLORHEXIDINE GLUCONATE 4 % EX SOLN: 60.0000 mL | Freq: Once | CUTANEOUS | Status: DC

## 2024-09-18 MED ORDER — PHENYLEPHRINE 80 MCG/ML (10ML) SYRINGE FOR IV PUSH (FOR BLOOD PRESSURE SUPPORT)
PREFILLED_SYRINGE | INTRAVENOUS | Status: DC | PRN
  Administered 2024-09-18: 160 ug via INTRAVENOUS
  Administered 2024-09-18: 120 ug via INTRAVENOUS
  Administered 2024-09-18: 160 ug via INTRAVENOUS
  Administered 2024-09-18: 80 ug via INTRAVENOUS

## 2024-09-18 MED ORDER — OXYCODONE HCL 5 MG/5ML PO SOLN: 5.0000 mg | Freq: Once | ORAL | Status: DC | PRN

## 2024-09-18 MED ORDER — FENTANYL CITRATE (PF) 250 MCG/5ML IJ SOLN
INTRAMUSCULAR | Status: DC | PRN
  Administered 2024-09-18 (×3): 50 ug via INTRAVENOUS

## 2024-09-18 MED ORDER — HEPARIN SODIUM (PORCINE) 1000 UNIT/ML IJ SOLN
INTRAMUSCULAR | Status: AC
  Filled 2024-09-18: qty 1

## 2024-09-18 MED ORDER — ONDANSETRON HCL 4 MG/2ML IJ SOLN
INTRAMUSCULAR | Status: AC
  Filled 2024-09-18: qty 2

## 2024-09-18 MED ORDER — ACETAMINOPHEN 500 MG PO TABS
1000.0000 mg | ORAL_TABLET | Freq: Once | ORAL | Status: AC
  Administered 2024-09-18: 1000 mg via ORAL

## 2024-09-18 MED ORDER — LIDOCAINE 2% (20 MG/ML) 5 ML SYRINGE
INTRAMUSCULAR | Status: AC
  Filled 2024-09-18: qty 5

## 2024-09-18 MED ORDER — CEFAZOLIN SODIUM-DEXTROSE 2-4 GM/100ML-% IV SOLN
INTRAVENOUS | Status: AC
  Filled 2024-09-18: qty 100

## 2024-09-18 MED ORDER — DEXAMETHASONE SOD PHOSPHATE PF 10 MG/ML IJ SOLN
INTRAMUSCULAR | Status: DC | PRN
  Administered 2024-09-18: 10 mg via INTRAVENOUS

## 2024-09-18 MED ORDER — ONDANSETRON HCL 4 MG/2ML IJ SOLN
INTRAMUSCULAR | Status: DC | PRN
  Administered 2024-09-18: 4 mg via INTRAVENOUS

## 2024-09-18 MED ORDER — FENTANYL CITRATE (PF) 100 MCG/2ML IJ SOLN: 25.0000 ug | INTRAMUSCULAR | Status: DC | PRN

## 2024-09-18 MED ORDER — CEFAZOLIN SODIUM-DEXTROSE 2-4 GM/100ML-% IV SOLN
2.0000 g | INTRAVENOUS | Status: AC
  Administered 2024-09-18: 2 g via INTRAVENOUS

## 2024-09-18 MED ORDER — ORAL CARE MOUTH RINSE: 15.0000 mL | Freq: Once | OROMUCOSAL | Status: AC

## 2024-09-18 MED ORDER — HEPARIN SODIUM (PORCINE) 1000 UNIT/ML IJ SOLN
INTRAMUSCULAR | Status: AC
  Filled 2024-09-18: qty 10

## 2024-09-18 NOTE — Transfer of Care (Signed)
 Immediate Anesthesia Transfer of Care Note  Patient: [name]  Procedure(s) Performed: INSERTION RIGHT ARM GRAFT, ARTERIOVENOUS, UPPER EXTREMITY (Right: Arm Upper)  Patient Location: PACU  Anesthesia Type:General  Level of Consciousness: awake and alert   Airway & Oxygen Therapy: Patient Spontanous Breathing  Post-op Assessment: Report given to RN  Post vital signs: Reviewed and stable  Last Vitals:  Vitals Value Taken Time  BP 122/65 09/18/24 12:16  Temp    Pulse 124 09/18/24 12:21  Resp 16 09/18/24 12:21  SpO2 88 % 09/18/24 12:21  Vitals shown include unfiled device data.  Last Pain:  Vitals:   09/18/24 0938  TempSrc:   PainSc: 0-No pain         Complications: No notable events documented.

## 2024-09-18 NOTE — Discharge Instructions (Signed)
 "  [facility]  Discharge Instructions  AV Fistula or Graft Surgery for Dialysis Access  Please refer to the following instructions for your post-procedure care. Your surgeon or physician assistant will discuss any changes with you.  Activity  You may drive the day following your surgery, if you are comfortable and no longer taking prescription pain medication. Resume full activity as the soreness in your incision resolves.  Bathing/Showering  You may shower after you go home. Keep your incision dry for 48 hours. Do not soak in a bathtub, hot tub, or swim until the incision heals completely. You may not shower if you have a hemodialysis catheter.  Incision Care  Clean your incision with mild soap and water  after 48 hours. Pat the area dry with a clean towel. You do not need a bandage unless otherwise instructed. Do not apply any ointments or creams to your incision. You may have skin glue on your incision. Do not peel it off. It will come off on its own in about one week. Your arm may swell a bit after surgery. To reduce swelling use pillows to elevate your arm so it is above your heart. Your doctor will tell you if you need to lightly wrap your arm with an ACE bandage.  Diet  Resume your normal diet. There are not special food restrictions following this procedure. In order to heal from your surgery, it is CRITICAL to get adequate nutrition. Your body requires vitamins, minerals, and protein. Vegetables are the best source of vitamins and minerals. Vegetables also provide the perfect balance of protein. Processed food has little nutritional value, so try to avoid this.  Medications  Resume taking all of your medications. If your incision is causing pain, you may take over-the counter pain relievers such as acetaminophen  (Tylenol ). If you were prescribed a stronger pain medication, please be aware these medications can cause nausea and constipation. Prevent  nausea by taking the medication with a snack or meal. Avoid constipation by drinking plenty of fluids and eating foods with high amount of fiber, such as fruits, vegetables, and grains.  Do not take Tylenol  if you are taking prescription pain medications.  Follow up Your surgeon may want to see you in the office following your access surgery. If so, this will be arranged at the time of your surgery.  Please call us  immediately for any of the following conditions:  Increased pain, redness, drainage (pus) from your incision site Fever of 101 degrees or higher Severe or worsening pain at your incision site Hand pain, weakness, or numbness.  Reduce your risk of vascular disease:  Stop smoking. If you would like help, call [facility] at 1-800-QUIT-NOW ((902)537-6993) or [place] at (724)247-7656  Manage your cholesterol Maintain a desired weight Control your diabetes Keep your blood pressure down  Dialysis  It will take several weeks to several months for your new dialysis access to be ready for use. Your surgeon will determine when it is okay to use it. Your nephrologist will continue to direct your dialysis. You can continue to use your Permcath until your new access is ready for use.   09/18/2024 [name] [id number] 01/22/1956  Surgeon(s): Pearline Norman RAMAN, MD  Procedures: INSERTION RIGHT ARM GRAFT, ARTERIOVENOUS, UPPER EXTREMITY   May stick graft immediately   May stick graft on designated area only:   x Do not stick graft for 6 weeks    If you have any questions, please call  the office at [phone].  "

## 2024-09-18 NOTE — Anesthesia Procedure Notes (Addendum)
 Procedure Name: LMA Insertion Date/Time: 09/18/2024 10:51 AM  Performed by: Delores Duwaine SAUNDERS, CRNAPre-anesthesia Checklist: Patient identified, Emergency Drugs available, Suction available and Patient being monitored Patient Re-evaluated:Patient Re-evaluated prior to induction Oxygen Delivery Method: Circle System Utilized Preoxygenation: Pre-oxygenation with 100% oxygen Induction Type: IV induction Ventilation: Mask ventilation without difficulty LMA: LMA inserted LMA Size: 4.0 Number of attempts: 1 Airway Equipment and Method: Bite block Placement Confirmation: positive ETCO2 Tube secured with: Tape Dental Injury: Teeth and Oropharynx as per pre-operative assessment

## 2024-09-18 NOTE — H&P (Signed)
 Patient seen and examined.  No complaints. No changes to medication history or physical exam since last seen. After discussing the risks and benefits of RUE AVG, [name] elected to proceed.   Norman GORMAN Serve MD    _____________________________________________________________________________________     Office Note        CC:  follow up Requesting Provider:  [name], [name], DO   HPI: [name] is a 69 y.o. (1956/05/23) female who presents status post right arm basilic vein fistula creation on 06/12/2024 by Dr. Sheree.  This was her first access surgery.  She is currently dialyzing via left IJ TDC which was placed at the same time.  She denies steal symptoms in the right hand.  She believes her incision has healed well.  She dialyzes on a Monday Wednesday Friday schedule at the [facility] location in [place].         Past Medical History:  Diagnosis Date   Abdominal pain      Hx   Acute pyelonephritis 12/07/2020   Allergy     Anal stenosis     Anemia     Anxiety     Arthritis     Asthma      patient does not have inhaler   Blood in stool      Hx   Blood in urine      Hx   Blood transfusion without reported diagnosis     Cataract     CKD (chronic kidney disease) stage 3, GFR 30-59 ml/min (HCC)     Crohn's colitis, other complication (HCC)     De Quervain's tenosynovitis     Depression     Difficulty urinating      Hx   Easy bruising     Esophagitis     Fainting      History - resolved - due to dehydration   Fatigue      Hx   Fibroid      Hx   Gastric polyp     GERD (gastroesophageal reflux disease)     Hearing loss      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   Hemorrhoids, external     Hemorrhoids, internal     Herpes, genital      vaginal treated 07/05/12 and pt states is resolved   History of cervical dysplasia     History of small bowel obstruction     Hyperlipidemia      currently no meds   Hyperparathyroidism      Hypertension     Hypokalemia      Hx   Hypotension 06/29/2022   IBD (inflammatory bowel disease)      initially colectomy for suspected UC, 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   Obesity     Ovarian cyst     Pain in joint, pelvic region and thigh 12/16/2008    Centricity Description: HIP PAIN, LEFT  Qualifier: Diagnosis of   By: Antonio ROSALEA Jamee      Centricity Description: HIP PAIN  Qualifier: Diagnosis of   By: Antonio ROSALEA Jamee       Pain of right thumb 04/12/2018   Poor dental hygiene     Pulmonary nodule, right      right upper lobe   Rectal bleeding      Hx   Rectal pain      Hx  Renal insufficiency      CKD - stage 3   RLS (restless legs syndrome)      no meds   Sepsis secondary to UTI (HCC) 06/29/2022   Severe sepsis (HCC) 12/07/2020   Tooth infection 11/2016    right low   Ulcerative colitis     Visual disturbance      wears glasses   Weakness generalized      Hx - patient denies generalized weakness   Wears dentures      upper only               Past Surgical History:  Procedure Laterality Date   ANAL DILATION       AV FISTULA PLACEMENT Right 06/12/2024    Procedure: ARTERIOVENOUS (AV) FISTULA CREATION RIGHT ARM;  Surgeon: Sheree Penne Bruckner, MD;  Location: [facility] OR;  Service: Vascular;  Laterality: Right;   BIOPSY   07/20/2022    Procedure: BIOPSY;  Surgeon: Aneita Gwendlyn DASEN, MD;  Location: [facility];  Service: Gastroenterology;;   CERVICAL BIOPSY  W/ LOOP ELECTRODE EXCISION       CHOLECYSTECTOMY       COLONOSCOPY        [name]   fatty tumor removed from back        X 2   HEMORRHOID SURGERY       ILEOSCOPY N/A 07/20/2022    Procedure: ILEOSCOPY THROUGH STOMA;  Surgeon: Aneita Gwendlyn DASEN, MD;  Location: [facility];  Service: Gastroenterology;  Laterality: N/A;   ILEOSTOMY CLOSURE       INSERTION OF DIALYSIS CATHETER Left 06/12/2024    Procedure: INSERTION OF DIALYSIS CATHETER;  Surgeon: Sheree Penne Bruckner, MD;  Location: [facility] OR;  Service: Vascular;  Laterality: Left;  TUNNLELED   IR CV LINE INJECTION   05/30/2023   IR CV LINE INJECTION   08/02/2023   IR CV LINE INJECTION   12/05/2023   IR IMAGING GUIDED PORT INSERTION   04/12/2023   IR IMAGING GUIDED PORT INSERTION   12/22/2023   IR REMOVAL TUN ACCESS W/ PORT W/O FL MOD SED   12/22/2023   RESTORATIVE PROCTOCOLECTOMY        with insertion of ileoanal J Pouch with loop ileostomy   SHOULDER ARTHROSCOPY WITH CAPSULORRHAPHY Left 06/14/2019    Procedure: LEFT SHOULDER ARTHRSCOPIC REPAIR OF BONY BANKART FRACTURE;  Surgeon: Dozier Soulier, MD;  Location: [facility];  Service: Orthopedics;  Laterality: Left;  REQUEST 90 MINUTE   SIGMOIDOSCOPY       TOTAL ABDOMINAL HYSTERECTOMY   1998    TAH/LSO   TUBAL LIGATION       UPPER GASTROINTESTINAL ENDOSCOPY        [name]          Social History         Socioeconomic History   Marital status: Divorced      Spouse name: Not on file   Number of children: Not on file   Years of education: Not on file   Highest education level: Not on file  Occupational History   Not on file  Tobacco Use   Smoking status: Former      Current packs/day: 0.00      Average packs/day: 1 pack/day for 4.0 years (4.0 ttl pk-yrs)      Types: Cigarettes      Start date: 05/21/1975      Quit date: 05/21/1979      Years since quitting: 45.2   Smokeless tobacco: Never  Vaping Use   Vaping status: Never Used  Substance and Sexual Activity   Alcohol use: No      Alcohol/week: 0.0 standard drinks of alcohol   Drug use: No   Sexual activity: Yes      Birth control/protection: Post-menopausal      Comment: 1st intercourse 69 yo-Fewer than 5 partners  Other Topics Concern   Not on file  Social History Narrative   Not on file    Social Drivers of Health        Tobacco Use: Medium Risk (08/16/2024)    Patient History     Smoking Tobacco Use: Former     Smokeless Tobacco Use: Never     Passive Exposure: Not  on Actuary Strain: Low Risk (05/22/2024)    Overall Financial Resource Strain (CARDIA)     Difficulty of Paying Living Expenses: Not very hard  Food Insecurity: No Food Insecurity (06/08/2024)    Epic     Worried About Programme Researcher, Broadcasting/film/video in the Last Year: Never true     Ran Out of Food in the Last Year: Never true  Transportation Needs: No Transportation Needs (06/08/2024)    Epic     Lack of Transportation (Medical): No     Lack of Transportation (Non-Medical): No  Physical Activity: Insufficiently Active (05/22/2024)    Exercise Vital Sign     Days of Exercise per Week: 7 days     Minutes of Exercise per Session: 10 min  Stress: No Stress Concern Present (05/22/2024)    [employer] of Occupational Health - Occupational Stress Questionnaire     Feeling of Stress: Not at all  Social Connections: Socially Isolated (06/08/2024)    Social Connection and Isolation Panel     Frequency of Communication with Friends and Family: More than three times a week     Frequency of Social Gatherings with Friends and Family: More than three times a week     Attends Religious Services: Never     Database Administrator or Organizations: No     Attends Banker Meetings: Never     Marital Status: Widowed  Intimate Partner Violence: Not At Risk (06/08/2024)    Epic     Fear of Current or Ex-Partner: No     Emotionally Abused: No     Physically Abused: No     Sexually Abused: No  Depression (PHQ2-9): Low Risk (07/17/2024)    Depression (PHQ2-9)     PHQ-2 Score: 0  Recent Concern: Depression (PHQ2-9) - High Risk (05/03/2024)    Depression (PHQ2-9)     PHQ-2 Score: 17  Alcohol Screen: Low Risk (05/22/2024)    Alcohol Screen     Last Alcohol Screening Score (AUDIT): 0  Housing: Low Risk (06/08/2024)    Epic     Unable to Pay for Housing in the Last Year: No     Number of Times Moved in the Last Year: 1     Homeless in the Last Year: No  Utilities: Not At Risk  (06/08/2024)    Epic     Threatened with loss of utilities: No  Health Literacy: Adequate Health Literacy (05/22/2024)    B1300 Health Literacy     Frequency of need for help with medical instructions: Never           Family History  Problem Relation Age of Onset   Hypertension Mother     Heart disease Mother  s/p pci   Ulcerative colitis Father     Hypertension Father     Heart attack Father     Diabetes Sister     Cancer Sister          uterine   Pulmonary fibrosis Sister     Cancer Maternal Uncle          LUNG   Ulcerative colitis Daughter     Liver disease Daughter     Irritable bowel syndrome Other          grandchildren   Colon cancer Neg Hx     Esophageal cancer Neg Hx     Stomach cancer Neg Hx     Rectal cancer Neg Hx                  Current Outpatient Medications  Medication Sig Dispense Refill   acetaminophen  (TYLENOL ) 500 MG tablet Take 1,000 mg by mouth every 6 (six) hours as needed (for pain/sleep).       allopurinol  (ZYLOPRIM ) 300 MG tablet TAKE 1/2 TABLET (150 mg total) BY MOUTH DAILY AT 9PM 90 tablet 11   amLODipine  (NORVASC ) 5 MG tablet Take 1 tablet (5 mg total) by mouth daily. (Patient not taking: Reported on 07/17/2024) 30 tablet 0   calcitRIOL  (ROCALTROL ) 0.25 MCG capsule Take 0.25 mcg by mouth daily. (Patient not taking: Reported on 07/17/2024)       cephALEXin  (KEFLEX ) 500 MG capsule Take 1 capsule (500 mg total) by mouth 2 (two) times daily. 14 capsule 0   DULoxetine  (CYMBALTA ) 60 MG capsule TAKE ONE CAPSULE (60 MG TOTAL) BY MOUTH DAILY AT 9AM (PATIENT NEEDS FURTHER EVALUATION AND/OR LABORATORY TESTING BEFORE FURTHER REFILLS ARE GIVEN) 60 capsule 11   hydrALAZINE  (APRESOLINE ) 50 MG tablet Take 1 tablet (50 mg total) by mouth every 8 (eight) hours. (Patient not taking: Reported on 07/17/2024) 90 tablet 0   metoprolol  succinate (TOPROL -XL) 100 MG 24 hr tablet Take 100 mg by mouth at bedtime.       octreotide  (SANDOSTATIN  LAR DEPOT) 20 MG  injection Inject 20 mg into the muscle every 28 (twenty-eight) days. 1 each 6   Vitamin D , Ergocalciferol , (DRISDOL ) 1.25 MG (50000 UNIT) CAPS capsule Take 50,000 Units by mouth every 30 (thirty) days.          No current facility-administered medications for this visit.             Facility-Administered Medications Ordered in Other Visits  Medication Dose Route Frequency Provider Last Rate Last Admin   heparin  lock flush 100 unit/mL  500 Units Intracatheter Once PRN Rayburn Pac, MD       heparin  lock flush 100 unit/mL  250 Units Intracatheter Once PRN Rayburn Pac, MD       octreotide  (SANDOSTATIN  LAR) IM injection 20 mg  20 mg Intramuscular Once Armbruster, Elspeth SQUIBB, MD       sodium chloride  0.9 % bolus 1,000 mL  1,000 mL Intravenous Once Rayburn Pac, MD            [Allergies]  [Allergies]      Allergen Reactions   Sulfa Antibiotics Hives, Itching, Rash and Swelling   Morphine Other (See Comments)      Gives me crazy dreams (delusions, also) Psychosis, also     Sulfonamide Derivatives Hives, Itching and Swelling       REVIEW OF SYSTEMS:  Negative unless noted in HPI [X]  denotes positive finding, [ ]  denotes negative finding Cardiac   Comments:  Chest pain or chest pressure:      Shortness of breath upon exertion:      Short of breath when lying flat:      Irregular heart rhythm:             Vascular      Pain in calf, thigh, or hip brought on by ambulation:      Pain in feet at night that wakes you up from your sleep:       Blood clot in your veins:      Leg swelling:              Pulmonary      Oxygen at home:      Productive cough:       Wheezing:              Neurologic      Sudden weakness in arms or legs:       Sudden numbness in arms or legs:       Sudden onset of difficulty speaking or slurred speech:      Temporary loss of vision in one eye:       Problems with dizziness:              Gastrointestinal      Blood in stool:        Vomited blood:              Genitourinary      Burning when urinating:       Blood in urine:             Psychiatric      Major depression:              Hematologic      Bleeding problems:      Problems with blood clotting too easily:             Skin      Rashes or ulcers:             Constitutional      Fever or chills:          PHYSICAL EXAMINATION:      Vitals:    08/16/24 1254  BP: 127/79  Pulse: (!) 120  Temp: 97.7 F (36.5 C)  TempSrc: Temporal  Weight: 179 lb 3.2 oz (81.3 kg)      General:  WDWN in NAD; vital signs documented above Gait: Not observed HENT: WNL, normocephalic Pulmonary: normal non-labored breathing Cardiac: regular HR Abdomen: soft, NT, no masses Skin: without rashes Extremities: Symmetrical grip strength; unable to palpate thrill or flow through the right arm fistula Musculoskeletal: no muscle wasting or atrophy       Neurologic: A&O X 3 Psychiatric:  The pt has Normal affect.     Non-Invasive Vascular Imaging:   Right arm AV fistula occluded on duplex       ASSESSMENT/PLAN:: 69 y.o. female status post brachial basilic fistula creation in the right arm   Ms. Moorefield is a 69 year old female who underwent right brachiobasilic fistula creation about 2 months ago.  On exam I am unable to palpate any flow through the fistula.  Duplex confirms that the fistula has occluded.  Preoperative vein mapping did not demonstrate any cephalic vein in the right arm.  She will continue HD via left IJ [facility] for now.  We will schedule her for right arm AV graft placement.  Case was discussed in detail with the patient  and she is agreeable to proceed.  This will be scheduled on a nondialysis day in January.     Donnice Sender, PA-C [facility] [phone]   Clinic MD:   [place]

## 2024-09-18 NOTE — Anesthesia Preprocedure Evaluation (Signed)
"                                    Anesthesia Evaluation  Patient identified by MRN, date of birth, ID band Patient awake    Reviewed: Allergy & Precautions, NPO status , Patient's Chart, lab work & pertinent test results, reviewed documented beta blocker date and time   History of Anesthesia Complications Negative for: history of anesthetic complications  Airway Mallampati: II  TM Distance: >3 FB Neck ROM: Full    Dental  (+) Edentulous Lower, Edentulous Upper   Pulmonary asthma , former smoker   Pulmonary exam normal        Cardiovascular hypertension, Pt. on medications and Pt. on home beta blockers Normal cardiovascular exam  06/2024 IMPRESSIONS     1. Left ventricular ejection fraction, by estimation, is 70 to 75%. The  left ventricle has hyperdynamic function. The left ventricle has no  regional wall motion abnormalities. Indeterminate diastolic filling due to  E-A fusion.   2. Right ventricular systolic function is normal. The right ventricular  size is normal.   3. The mitral valve is normal in structure. Mild mitral valve  regurgitation.   4. The aortic valve is tricuspid. Aortic valve regurgitation is mild.   5. The inferior vena cava is normal in size with greater than 50%  respiratory variability, suggesting right atrial pressure of 3 mmHg.     Neuro/Psych  PSYCHIATRIC DISORDERS Anxiety Depression     Hearing loss Visual disturbance     GI/Hepatic Neg liver ROS, PUD,GERD  Controlled and Medicated,,UC   Endo/Other    Renal/GU ESRFRenal disease     Musculoskeletal  (+) Arthritis ,    Abdominal   Peds  Hematology  (+) Blood dyscrasia, anemia   Anesthesia Other Findings HSV  Reproductive/Obstetrics                              Anesthesia Physical Anesthesia Plan  ASA: 4  Anesthesia Plan: General   Post-op Pain Management: Tylenol  PO (pre-op )*   Induction: Intravenous  PONV Risk Score and Plan:  3 and Treatment may vary due to age or medical condition, Ondansetron  and Dexamethasone   Airway Management Planned: LMA  Additional Equipment: None  Intra-op Plan:   Post-operative Plan: Extubation in OR  Informed Consent: I have reviewed the patients History and Physical, chart, labs and discussed the procedure including the risks, benefits and alternatives for the proposed anesthesia with the patient or authorized representative who has indicated his/her understanding and acceptance.     Dental advisory given  Plan Discussed with: CRNA and Anesthesiologist  Anesthesia Plan Comments:          Anesthesia Quick Evaluation  "

## 2024-09-18 NOTE — Anesthesia Postprocedure Evaluation (Signed)
"   Anesthesia Post Note  Patient: [name]  Procedure(s) Performed: INSERTION RIGHT ARM GRAFT, ARTERIOVENOUS, UPPER EXTREMITY (Right: Arm Upper)     Patient location during evaluation: PACU Anesthesia Type: General Level of consciousness: awake and alert Pain management: pain level controlled Vital Signs Assessment: post-procedure vital signs reviewed and stable Respiratory status: spontaneous breathing, nonlabored ventilation, respiratory function stable and patient connected to nasal cannula oxygen Cardiovascular status: blood pressure returned to baseline and stable Postop Assessment: no apparent nausea or vomiting Anesthetic complications: no   No notable events documented.  Last Vitals:  Vitals:   09/18/24 1245 09/18/24 1300  BP: (!) 106/58 124/77  Pulse: (!) 107 (!) 110  Resp: 11 14  Temp: 37 C   SpO2: 93% 92%    Last Pain:  Vitals:   09/18/24 1245  TempSrc:   PainSc: 0-No pain                 Thom JONELLE Peoples      "

## 2024-09-18 NOTE — Op Note (Signed)
 "   OPERATIVE NOTE  PROCEDURE:   Intraoperative right arm vein mapping right arm brachial - axillary AVG creation  PRE-OPERATIVE DIAGNOSIS: ESRD  POST-OPERATIVE DIAGNOSIS: same as above   SURGEON: Norman GORMAN Serve MD  ASSISTANT(S): Sherrilee Holster, [place]  Given the complexity of the case,  the assistant was necessary in order to expedient the procedure and safely perform the technical aspects of the operation.  The assistant provided traction and countertraction to assist with exposure of the artery and vein.  They also assisted with suture ligation of multiple venous branches. They also assisted with tunneling of the graft.  They played a critical role for both anastomoses. These skills, especially following the Prolene suture for the anastomosis, could not have been adequately performed by a scrub tech assistant.  ANESTHESIA: general  ESTIMATED BLOOD LOSS: 15 cc  FINDING(S): No adequately sized superficial veins of the right arm.  Chronically thrombosed right arm brachiobasilic AVF.  The artery was small in size approximately 2 to 2-1/2 mm.  There was an excellent Doppler thrill on completion.  The radial artery had a monophasic signal with the graft open versus multiphasic with it closed.  Given the small size of the artery I felt that this was a decent result and elected to close I evaluate her in PACU for clinical steal.  SPECIMEN(S):  none  INDICATIONS:   [name] is a 69 y.o. female with ESRD. The patient is  currently on dialysis via right IJ TDC. They were seen in the office for evaluation of hemodialysis access. The risks and benefits of access creation were reviewed including: need for additional procedures, need for additional creations, steal, ischemia monomelic neuropathy, failure of access, and bleeding. The patient expressed understand and is willing to proceed.    DESCRIPTION: The patient was brought to the operating room positioned supine on the operating table.   The right arm was prepped and draped in usual sterile fashion.  Timeout was performed and preoperative antibiotics were administered.  Intraoperative vein mapping of the arm was performed which demonstrated an occluded right arm brachiobasilic fistula and the cephalic was not adequately sized for fistula creation. The right brachial vein at the axilla was exposed using longitudinal incision.  Incision was carried down until the brachial sheath was encountered.  The brachial vein was identified, exposed, encircled with a Silastic Vesseloop. The brachial artery was exposed using a longitudinal incision in the distal arm just above the antecubital fossa.  Incision was carried down through subcutaneous tissue until the brachial sheath was encountered.  This was incised sharply.  The brachial artery was exposed and encircled with a Silastic Vesseloop. Using a curved, sheathed tunneling device, a 4-7 mm tapered Gore-Tex graft was tunneled subcutaneously in a gentle arc across the biceps.  The patient was then heparinized with 3000 units of IV heparin .   The brachial artery was clamped proximally and distally.  An anterior arteriotomy was made with an 11 blade.  This was extended with Potts scissors.  The 4 mm end of the Gore-Tex graft was spatulated and then anastomosed end-to-side to the brachial arteriotomy using continuous running suture of 6-0 Prolene.  The anastomosis was completed and hemostasis ensured.  The brachial artery clamps were removed and the graft was clamped restoring perfusion to the hand. The brachial vein was clamped proximally and distally.  An anterior venotomy was made with an 11 blade.  This was extended with Potts scissors.  The 7 mm end of the  Gore-Tex graft was then anastomosed end to side to the brachial vein venotomy using continuous running suture of 6-0 Prolene. Immediately prior to completion the anastomosis was de-aired and flushed.  Anastomosis was then completed.  Hemostasis was  insured.   A doppler machine was brought onto the field to interrogate the graft.  Monophasic doppler flow was noted in the radial artery. Distal to the venous anastomosis a Doppler bruit was heard. The incisions were copiously irrigated and then closed in layers with 3-0 Vicryl and 4-0 Monocryl for the skin. Dermabond was applied. All counts were correct at the end of the case. The patient tolerated the procedure well and was brought to recovery in stable condition.    COMPLICATIONS: None apparent  CONDITION: Stable  Norman GORMAN Serve MD [facility] Phone Number: [phone] 09/18/2024 12:23 PM       "

## 2024-09-19 ENCOUNTER — Encounter (HOSPITAL_COMMUNITY): Payer: Self-pay | Admitting: Vascular Surgery

## 2024-09-20 ENCOUNTER — Other Ambulatory Visit: Payer: Self-pay

## 2024-09-20 DIAGNOSIS — I739 Peripheral vascular disease, unspecified: Secondary | ICD-10-CM

## 2024-09-21 ENCOUNTER — Ambulatory Visit: Attending: Vascular Surgery | Admitting: Vascular Surgery

## 2024-09-21 ENCOUNTER — Ambulatory Visit (HOSPITAL_COMMUNITY)
Admission: RE | Admit: 2024-09-21 | Discharge: 2024-09-21 | Disposition: A | Discharge status: Home / Self Care | Source: Ambulatory Visit | Attending: Surgery | Admitting: Surgery

## 2024-09-21 ENCOUNTER — Encounter: Payer: Self-pay | Admitting: Vascular Surgery

## 2024-09-21 VITALS — BP 124/76 | HR 106 | Temp 98.3°F | Resp 20 | Ht 63.0 in | Wt 179.6 lb

## 2024-09-21 DIAGNOSIS — I872 Venous insufficiency (chronic) (peripheral): Secondary | ICD-10-CM | POA: Diagnosis not present

## 2024-09-21 DIAGNOSIS — I739 Peripheral vascular disease, unspecified: Secondary | ICD-10-CM | POA: Diagnosis present

## 2024-09-21 DIAGNOSIS — N186 End stage renal disease: Secondary | ICD-10-CM | POA: Diagnosis not present

## 2024-09-21 LAB — VAS US ABI WITH/WO TBI
Left ABI: 1.08
Right ABI: 1.05

## 2024-09-21 NOTE — Progress Notes (Signed)
 "  Patient ID: [name], female   DOB: 1956-06-21, 69 y.o.   MRN: [id number]  Reason for Consult: Follow-up   Referred by [name], Jeanette SAUNDERS, DO  Subjective:     HPI [name] is a 69 y.o. female presenting for evaluation of bilateral foot discoloration and numbness, tingling.  She reports her feet are always numb and feels like she is walking on rocks.  She also has pins and needle sensation constantly and purple discoloration to the tips of all 10 toes.  She denies claudication, nocturnal rest pain or slow/nonhealing wounds.  She is not a diabetic. She also recently underwent right arm AVG placement earlier this week and there is an audible bruit.  Past Medical History:  Diagnosis Date   Abdominal pain    Hx   Acute pyelonephritis 12/07/2020   Allergy    Anal stenosis    Anemia    Anxiety    Arthritis    Asthma    patient does not have inhaler   Blood in stool    Hx   Blood in urine    Hx   Blood transfusion without reported diagnosis    Cataract    CKD (chronic kidney disease) stage 3, GFR 30-59 ml/min (HCC)    Crohn's colitis, other complication (HCC)    De Quervain's tenosynovitis    Depression    Difficulty urinating    Hx   Easy bruising    Esophagitis    Fainting    History - resolved - due to dehydration   Fatigue    Hx   Fibroid    Hx   Gastric polyp    GERD (gastroesophageal reflux disease)    Hearing loss    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   Hemorrhoids, external    Hemorrhoids, internal    Herpes, genital    vaginal treated 07/05/12 and pt states is resolved   History of cervical dysplasia    History of small bowel obstruction    Hyperlipidemia    currently no meds   Hyperparathyroidism    Hypertension    Hypokalemia    Hx   Hypotension 06/29/2022   IBD (inflammatory bowel disease)    initially colectomy for suspected UC, 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   Obesity     Ovarian cyst    Pain in joint, pelvic region and thigh 12/16/2008   Centricity Description: HIP PAIN, LEFT  Qualifier: Diagnosis of   By: [name]      Centricity Description: HIP PAIN  Qualifier: Diagnosis of   By: [name]       Pain of right thumb 04/12/2018   Peripheral vascular disease    Poor dental hygiene    Pulmonary nodule, right    right upper lobe   Rectal bleeding    Hx   Rectal pain    Hx   Renal insufficiency    CKD - stage 3   RLS (restless legs syndrome)    no meds   Sepsis secondary to UTI (HCC) 06/29/2022   Severe sepsis (HCC) 12/07/2020   Tooth infection 11/2016   right low   Ulcerative colitis    Visual disturbance    wears glasses   Weakness generalized    Hx - patient denies generalized weakness   Wears dentures    upper only   Family History  Problem Relation Age of Onset   Hypertension Mother    Heart disease Mother        s/p pci   Ulcerative colitis Father    Hypertension Father    Heart attack Father    Diabetes Sister    Cancer Sister        uterine   Pulmonary fibrosis Sister    Cancer Maternal Uncle        LUNG   Ulcerative colitis Daughter    Liver disease Daughter    Irritable bowel syndrome Other        grandchildren   Colon cancer Neg Hx    Esophageal cancer Neg Hx    Stomach cancer Neg Hx    Rectal cancer Neg Hx    Past Surgical History:  Procedure Laterality Date   ANAL DILATION     AV FISTULA PLACEMENT Right 06/12/2024   Procedure: ARTERIOVENOUS (AV) FISTULA CREATION RIGHT ARM;  Surgeon: Sheree Penne Bruckner, MD;  Location: [facility] OR;  Service: Vascular;  Laterality: Right;   BIOPSY  07/20/2022   Procedure: BIOPSY;  Surgeon: Jeanette Gwendlyn DASEN, MD;  Location: [facility];  Service: Gastroenterology;;   CERVICAL BIOPSY  W/ LOOP ELECTRODE EXCISION     CHOLECYSTECTOMY     COLONOSCOPY     [name]   fatty tumor removed from back     X 2   HEMORRHOID SURGERY     ILEOSCOPY N/A 07/20/2022   Procedure:  ILEOSCOPY THROUGH STOMA;  Surgeon: Jeanette Gwendlyn DASEN, MD;  Location: [facility];  Service: Gastroenterology;  Laterality: N/A;   ILEOSTOMY CLOSURE     INSERTION OF ARTERIOVENOUS (AV) ARTEGRAFT ARM Right 09/18/2024   Procedure: INSERTION RIGHT ARM GRAFT, ARTERIOVENOUS, UPPER EXTREMITY;  Surgeon: Pearline Norman RAMAN, MD;  Location: [facility] OR;  Service: Vascular;  Laterality: Right;   INSERTION OF DIALYSIS CATHETER Left 06/12/2024   Procedure: INSERTION OF DIALYSIS CATHETER;  Surgeon: Sheree Penne Bruckner, MD;  Location: [facility] OR;  Service: Vascular;  Laterality: Left;  TUNNLELED   IR CV LINE INJECTION  05/30/2023   IR CV LINE INJECTION  08/02/2023   IR CV LINE INJECTION  12/05/2023   IR IMAGING GUIDED PORT INSERTION  04/12/2023   IR IMAGING GUIDED PORT INSERTION  12/22/2023   IR REMOVAL TUN ACCESS W/ PORT W/O FL MOD SED  12/22/2023   RESTORATIVE PROCTOCOLECTOMY     with insertion of ileoanal J Pouch with loop ileostomy   SHOULDER ARTHROSCOPY WITH CAPSULORRHAPHY Left 06/14/2019   Procedure: LEFT SHOULDER ARTHRSCOPIC REPAIR OF BONY BANKART FRACTURE;  Surgeon: Dozier Soulier, MD;  Location: [facility] ORS;  Service: Orthopedics;  Laterality: Left;  REQUEST 90 MINUTE   SIGMOIDOSCOPY     TOTAL ABDOMINAL HYSTERECTOMY  1998   TAH/LSO   TUBAL LIGATION     UPPER GASTROINTESTINAL ENDOSCOPY     [name]    Short Social History:  Social History   Tobacco Use   Smoking status: Former    Current packs/day: 0.00    Average packs/day: 1 pack/day for 4.0 years (4.0 ttl pk-yrs)    Types: Cigarettes    Start date: 05/21/1975    Quit date: 05/21/1979    Years since quitting: 45.3   Smokeless tobacco: Never  Substance Use Topics   Alcohol use: No    Alcohol/week: 0.0 standard drinks of alcohol    Allergies[1]  Current Outpatient Medications  Medication Sig Dispense Refill   acetaminophen  (TYLENOL ) 500 MG tablet Take 1,000 mg by mouth every 6 (six)  hours as needed for moderate pain (pain score 4-6).     allopurinol   (ZYLOPRIM ) 300 MG tablet TAKE 1/2 TABLET (150 mg total) BY MOUTH DAILY AT 9PM 90 tablet 11   DULoxetine  (CYMBALTA ) 60 MG capsule TAKE ONE CAPSULE (60 MG TOTAL) BY MOUTH DAILY AT 9AM (PATIENT NEEDS FURTHER EVALUATION AND/OR LABORATORY TESTING BEFORE FURTHER REFILLS ARE GIVEN) (Patient taking differently: Take 120 mg by mouth daily. TAKE ONE CAPSULE (60 MG TOTAL) BY MOUTH DAILY AT 9AM (PATIENT NEEDS FURTHER EVALUATION AND/OR LABORATORY TESTING BEFORE FURTHER REFILLS ARE GIVEN)) 60 capsule 11   folic acid  (FOLVITE ) 1 MG tablet Take 1 mg by mouth every morning.     metoprolol  succinate (TOPROL -XL) 100 MG 24 hr tablet Take 100 mg by mouth at bedtime.     oxyCODONE  (ROXICODONE ) 5 MG immediate release tablet Take 1 tablet (5 mg total) by mouth every 4 (four) hours as needed for severe pain (pain score 7-10). 8 tablet 0   No current facility-administered medications for this visit.   Facility-Administered Medications Ordered in Other Visits  Medication Dose Route Frequency Provider Last Rate Last Admin   octreotide  (SANDOSTATIN  LAR) IM injection 20 mg  20 mg Intramuscular Once Armbruster, Elspeth SQUIBB, MD        REVIEW OF SYSTEMS  All other systems were reviewed and are negative     Objective:  Objective   Vitals:   09/21/24 0904  BP: 124/76  Pulse: (!) 106  Resp: 20  Temp: 98.3 F (36.8 C)  TempSrc: Temporal  SpO2: 95%  Weight: 179 lb 9.6 oz (81.5 kg)  Height: 5' 3 (1.6 m)   Body mass index is 31.81 kg/m.  Physical Exam General: no acute distress Cardiac: hemodynamically stable Extremities: Purple discoloration to the tips of all 10 toes.  This improves with elevation and warming.  No edema or ulceration. Vascular:   Right: Palpable DP, PT, palpable radial, audible bruit in right arm AVG  Left: Palpable DP, PT  Data: +---------+------------------+-----+-----------+--------+  Right   Rt Pressure (mmHg)IndexWaveform   Comment    +---------+------------------+-----+-----------+--------+  Brachial                                   AVG       +---------+------------------+-----+-----------+--------+  ATA     139               0.93 multiphasic          +---------+------------------+-----+-----------+--------+  PTA     157               1.05 triphasic            +---------+------------------+-----+-----------+--------+  Great Toe20                0.13                      +---------+------------------+-----+-----------+--------+   +---------+------------------+-----+---------+-------+  Left    Lt Pressure (mmHg)IndexWaveform Comment  +---------+------------------+-----+---------+-------+  Brachial 150                                      +---------+------------------+-----+---------+-------+  ATA     150               1.00 triphasic         +---------+------------------+-----+---------+-------+  PTA  162               1.08 triphasic         +---------+------------------+-----+---------+-------+  Great Toe67                0.45                   +---------+------------------+-----+---------+-------+      Assessment/Plan:   Najmah Carradine is a 69 y.o. female with ESRD who recently underwent right arm AVG placement.  This is an audible bruit and she has a palpable radial pulse on the right. Regarding her feet, I believe her discoloration is due to venous insufficiency and likely some element of Raynaud's as the discoloration was relieved with elevation and warming.  She has normal ABIs and palpable pedal pulses which rules out PAD although she did have some diminished toe pressures, I think this is likely related to spasm from the cold.  Will plan to continue with originally scheduled postop follow-up for 10/05/2024.   Norman GORMAN Serve MD [facility] [place]      [1]  Allergies Allergen Reactions   Sulfa Antibiotics Hives,  Itching, Rash and Swelling   Gold Hives and Itching   Morphine Other (See Comments)    Gives me crazy dreams (delusions, also) Psychosis, also    Silver Hives and Itching   Sulfonamide Derivatives Hives, Itching and Swelling   Zinc Trace Metal Additives [Zinc] Hives and Itching    Itching, Hives   "

## 2024-10-02 ENCOUNTER — Ambulatory Visit: Attending: Cardiology | Admitting: Cardiology

## 2024-10-03 NOTE — Progress Notes (Unsigned)
" ° ° °  Postoperative Access Visit   History of Present Illness   [name] is a 69 y.o. year old female who presents for postoperative follow-up for: right arm brachial - axillary AVG creation by Dr. Pearline on 09/18/24.  The patient's wounds are well healed.  The patient notes no steal symptoms. She does have some right forearm numbness. The patient is able to complete their activities of daily living.  She currently dialyzes on MWF via left internal jugular TDC at the [facility] location   Physical Examination   Vitals:   10/04/24 0939  BP: 121/80  Pulse: 100  Temp: 98.2 F (36.8 C)  SpO2: 98%  Weight: 178 lb (80.7 kg)  Height: 5' 3 (1.6 m)   Body mass index is 31.53 kg/m.  right arm Incision is well healed, 2+ radial pulse, hand grip is 5/5, sensation in digits is intact, palpable thrill, bruit can be auscultated     Medical Decision Making   [name] is a 69 y.o. year old female who presents s/p right arm brachial - axillary AVG creation by Dr. Pearline on 09/18/24. Her incision is well healed. Graft has great thrill. She does have small seroma at the [facility] incision. This should resolve with time. No signs of infection. Patent is without signs or symptoms of steal syndrome The patient's access will be ready for use after 10/19/24 The patient's tunneled dialysis catheter can be removed when Nephrology is comfortable with the performance of the right AV graft The patient may follow up on a prn basis   Teretha Damme, PA-C [facility] [place] Office: [phone]  Clinic MD: Lan[place] "

## 2024-10-04 ENCOUNTER — Ambulatory Visit: Attending: Surgery | Admitting: Physician Assistant

## 2024-10-04 VITALS — BP 121/80 | HR 100 | Temp 98.2°F | Ht 63.0 in | Wt 178.0 lb

## 2024-10-04 DIAGNOSIS — N186 End stage renal disease: Secondary | ICD-10-CM

## 2024-10-05 ENCOUNTER — Encounter

## 2024-10-11 ENCOUNTER — Telehealth: Payer: Self-pay | Admitting: Pharmacy Technician

## 2024-10-11 NOTE — Telephone Encounter (Addendum)
 Octreotide  f/u: Patient can resume taking Octreotide .  (Per Dr. Leigh) if she finds it helpful and that it reduced her diarrhea, then yes she can resume it, up to the patient and how much she thinks it helped her.

## 2024-10-16 ENCOUNTER — Ambulatory Visit

## 2024-10-23 ENCOUNTER — Ambulatory Visit

## 2024-10-23 DIAGNOSIS — I10 Essential (primary) hypertension: Secondary | ICD-10-CM

## 2024-10-23 DIAGNOSIS — N186 End stage renal disease: Secondary | ICD-10-CM

## 2024-10-23 DIAGNOSIS — I471 Supraventricular tachycardia, unspecified: Secondary | ICD-10-CM

## 2024-10-23 DIAGNOSIS — E785 Hyperlipidemia, unspecified: Secondary | ICD-10-CM

## 2024-11-06 ENCOUNTER — Encounter: Admitting: Nurse Practitioner

## 2025-05-28 ENCOUNTER — Ambulatory Visit

## 2025-07-17 ENCOUNTER — Inpatient Hospital Stay: Admitting: Family

## 2025-07-17 ENCOUNTER — Inpatient Hospital Stay
